# Patient Record
Sex: Female | Born: 1960 | Race: White | Hispanic: No | Smoking: Former smoker
Health system: Southern US, Community
[De-identification: ages and names within clinical notes are randomized; demographics above are authoritative.]

## PROBLEM LIST (undated history)

## (undated) DIAGNOSIS — J329 Chronic sinusitis, unspecified: Secondary | ICD-10-CM

## (undated) DIAGNOSIS — J449 Chronic obstructive pulmonary disease, unspecified: Secondary | ICD-10-CM

## (undated) DIAGNOSIS — G589 Mononeuropathy, unspecified: Secondary | ICD-10-CM

## (undated) DIAGNOSIS — M2041 Other hammer toe(s) (acquired), right foot: Secondary | ICD-10-CM

## (undated) DIAGNOSIS — Z950 Presence of cardiac pacemaker: Secondary | ICD-10-CM

## (undated) DIAGNOSIS — I4891 Unspecified atrial fibrillation: Secondary | ICD-10-CM

## (undated) DIAGNOSIS — E039 Hypothyroidism, unspecified: Secondary | ICD-10-CM

## (undated) DIAGNOSIS — J479 Bronchiectasis, uncomplicated: Secondary | ICD-10-CM

## (undated) DIAGNOSIS — I509 Heart failure, unspecified: Secondary | ICD-10-CM

## (undated) DIAGNOSIS — Z8774 Personal history of (corrected) congenital malformations of heart and circulatory system: Secondary | ICD-10-CM

## (undated) DIAGNOSIS — R413 Other amnesia: Secondary | ICD-10-CM

## (undated) DIAGNOSIS — M1990 Unspecified osteoarthritis, unspecified site: Secondary | ICD-10-CM

## (undated) DIAGNOSIS — F4310 Post-traumatic stress disorder, unspecified: Secondary | ICD-10-CM

## (undated) DIAGNOSIS — L089 Local infection of the skin and subcutaneous tissue, unspecified: Secondary | ICD-10-CM

## (undated) DIAGNOSIS — G4733 Obstructive sleep apnea (adult) (pediatric): Secondary | ICD-10-CM

## (undated) DIAGNOSIS — G629 Polyneuropathy, unspecified: Secondary | ICD-10-CM

## (undated) DIAGNOSIS — T148XXA Other injury of unspecified body region, initial encounter: Secondary | ICD-10-CM

## (undated) DIAGNOSIS — J45909 Unspecified asthma, uncomplicated: Secondary | ICD-10-CM

## (undated) DIAGNOSIS — D649 Anemia, unspecified: Secondary | ICD-10-CM

## (undated) DIAGNOSIS — G43909 Migraine, unspecified, not intractable, without status migrainosus: Secondary | ICD-10-CM

## (undated) DIAGNOSIS — T8859XA Other complications of anesthesia, initial encounter: Secondary | ICD-10-CM

## (undated) DIAGNOSIS — L509 Urticaria, unspecified: Secondary | ICD-10-CM

## (undated) DIAGNOSIS — E785 Hyperlipidemia, unspecified: Secondary | ICD-10-CM

## (undated) DIAGNOSIS — I442 Atrioventricular block, complete: Secondary | ICD-10-CM

## (undated) DIAGNOSIS — D808 Other immunodeficiencies with predominantly antibody defects: Secondary | ICD-10-CM

## (undated) DIAGNOSIS — G459 Transient cerebral ischemic attack, unspecified: Secondary | ICD-10-CM

## (undated) DIAGNOSIS — R55 Syncope and collapse: Secondary | ICD-10-CM

## (undated) DIAGNOSIS — M48061 Spinal stenosis, lumbar region without neurogenic claudication: Secondary | ICD-10-CM

## (undated) DIAGNOSIS — Z8616 Personal history of COVID-19: Secondary | ICD-10-CM

## (undated) DIAGNOSIS — R251 Tremor, unspecified: Secondary | ICD-10-CM

## (undated) DIAGNOSIS — R918 Other nonspecific abnormal finding of lung field: Secondary | ICD-10-CM

## (undated) DIAGNOSIS — R519 Headache, unspecified: Secondary | ICD-10-CM

## (undated) DIAGNOSIS — G5601 Carpal tunnel syndrome, right upper limb: Secondary | ICD-10-CM

## (undated) DIAGNOSIS — K5900 Constipation, unspecified: Secondary | ICD-10-CM

## (undated) DIAGNOSIS — R001 Bradycardia, unspecified: Secondary | ICD-10-CM

## (undated) DIAGNOSIS — K219 Gastro-esophageal reflux disease without esophagitis: Secondary | ICD-10-CM

## (undated) DIAGNOSIS — M2011 Hallux valgus (acquired), right foot: Secondary | ICD-10-CM

## (undated) DIAGNOSIS — M5134 Other intervertebral disc degeneration, thoracic region: Secondary | ICD-10-CM

## (undated) DIAGNOSIS — Z7901 Long term (current) use of anticoagulants: Secondary | ICD-10-CM

## (undated) DIAGNOSIS — I700 Atherosclerosis of aorta: Secondary | ICD-10-CM

## (undated) DIAGNOSIS — G479 Sleep disorder, unspecified: Secondary | ICD-10-CM

## (undated) DIAGNOSIS — I10 Essential (primary) hypertension: Secondary | ICD-10-CM

## (undated) DIAGNOSIS — T4145XA Adverse effect of unspecified anesthetic, initial encounter: Secondary | ICD-10-CM

## (undated) DIAGNOSIS — E119 Type 2 diabetes mellitus without complications: Secondary | ICD-10-CM

## (undated) DIAGNOSIS — E669 Obesity, unspecified: Secondary | ICD-10-CM

## (undated) DIAGNOSIS — F319 Bipolar disorder, unspecified: Secondary | ICD-10-CM

## (undated) DIAGNOSIS — D801 Nonfamilial hypogammaglobulinemia: Secondary | ICD-10-CM

## (undated) DIAGNOSIS — D802 Selective deficiency of immunoglobulin A [IgA]: Secondary | ICD-10-CM

## (undated) DIAGNOSIS — G4730 Sleep apnea, unspecified: Secondary | ICD-10-CM

## (undated) HISTORY — DX: Chronic sinusitis, unspecified: J32.9

## (undated) HISTORY — DX: Type 2 diabetes mellitus without complications: E11.9

## (undated) HISTORY — DX: Unspecified atrial fibrillation: I48.91

## (undated) HISTORY — DX: Nonfamilial hypogammaglobulinemia: D80.1

## (undated) HISTORY — DX: Other immunodeficiencies with predominantly antibody defects: D80.8

## (undated) HISTORY — DX: Unspecified asthma, uncomplicated: J45.909

## (undated) HISTORY — DX: Other injury of unspecified body region, initial encounter: T14.8XXA

## (undated) HISTORY — DX: Bipolar disorder, unspecified: F31.9

## (undated) HISTORY — DX: Unspecified osteoarthritis, unspecified site: M19.90

## (undated) HISTORY — DX: Mononeuropathy, unspecified: G58.9

## (undated) HISTORY — PX: CARDIAC SURGERY: SHX584

## (undated) HISTORY — DX: Bronchiectasis, uncomplicated: J47.9

## (undated) HISTORY — PX: CARPAL TUNNEL RELEASE: SHX101

## (undated) HISTORY — DX: Heart failure, unspecified: I50.9

## (undated) HISTORY — DX: Urticaria, unspecified: L50.9

## (undated) HISTORY — DX: Hypothyroidism, unspecified: E03.9

## (undated) HISTORY — DX: Local infection of the skin and subcutaneous tissue, unspecified: L08.9

## (undated) HISTORY — DX: Selective deficiency of immunoglobulin a (iga): D80.2

## (undated) HISTORY — DX: Carpal tunnel syndrome, right upper limb: G56.01

---
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
## 2007-10-11 HISTORY — PX: CARDIOVERSION: SHX1299

## 2015-10-11 HISTORY — PX: NASAL SINUS SURGERY: SHX719

## 2015-10-15 DIAGNOSIS — R0602 Shortness of breath: Secondary | ICD-10-CM | POA: Diagnosis not present

## 2015-11-10 DIAGNOSIS — Z1211 Encounter for screening for malignant neoplasm of colon: Secondary | ICD-10-CM | POA: Diagnosis not present

## 2015-11-10 DIAGNOSIS — K5730 Diverticulosis of large intestine without perforation or abscess without bleeding: Secondary | ICD-10-CM | POA: Diagnosis not present

## 2015-11-10 DIAGNOSIS — Z800 Family history of malignant neoplasm of digestive organs: Secondary | ICD-10-CM | POA: Diagnosis not present

## 2015-11-10 DIAGNOSIS — K621 Rectal polyp: Secondary | ICD-10-CM | POA: Diagnosis not present

## 2015-11-11 DIAGNOSIS — J470 Bronchiectasis with acute lower respiratory infection: Secondary | ICD-10-CM | POA: Diagnosis not present

## 2015-11-11 DIAGNOSIS — J320 Chronic maxillary sinusitis: Secondary | ICD-10-CM | POA: Diagnosis not present

## 2015-11-11 DIAGNOSIS — R0609 Other forms of dyspnea: Secondary | ICD-10-CM | POA: Diagnosis not present

## 2015-11-11 DIAGNOSIS — G4733 Obstructive sleep apnea (adult) (pediatric): Secondary | ICD-10-CM | POA: Diagnosis not present

## 2015-11-11 DIAGNOSIS — J4551 Severe persistent asthma with (acute) exacerbation: Secondary | ICD-10-CM | POA: Diagnosis not present

## 2015-11-11 DIAGNOSIS — I480 Paroxysmal atrial fibrillation: Secondary | ICD-10-CM | POA: Diagnosis not present

## 2015-11-11 DIAGNOSIS — I517 Cardiomegaly: Secondary | ICD-10-CM | POA: Diagnosis not present

## 2015-11-11 DIAGNOSIS — I10 Essential (primary) hypertension: Secondary | ICD-10-CM | POA: Diagnosis not present

## 2015-11-11 DIAGNOSIS — I519 Heart disease, unspecified: Secondary | ICD-10-CM | POA: Diagnosis not present

## 2015-12-03 DIAGNOSIS — R0602 Shortness of breath: Secondary | ICD-10-CM | POA: Diagnosis not present

## 2015-12-28 DIAGNOSIS — G43109 Migraine with aura, not intractable, without status migrainosus: Secondary | ICD-10-CM | POA: Diagnosis not present

## 2015-12-28 DIAGNOSIS — R45851 Suicidal ideations: Secondary | ICD-10-CM | POA: Diagnosis not present

## 2015-12-28 DIAGNOSIS — F450 Somatization disorder: Secondary | ICD-10-CM | POA: Diagnosis not present

## 2015-12-28 DIAGNOSIS — R079 Chest pain, unspecified: Secondary | ICD-10-CM | POA: Diagnosis not present

## 2015-12-28 DIAGNOSIS — G8194 Hemiplegia, unspecified affecting left nondominant side: Secondary | ICD-10-CM | POA: Diagnosis not present

## 2015-12-28 DIAGNOSIS — Z6841 Body Mass Index (BMI) 40.0 and over, adult: Secondary | ICD-10-CM | POA: Diagnosis not present

## 2015-12-28 DIAGNOSIS — I110 Hypertensive heart disease with heart failure: Secondary | ICD-10-CM | POA: Diagnosis not present

## 2015-12-28 DIAGNOSIS — I679 Cerebrovascular disease, unspecified: Secondary | ICD-10-CM | POA: Diagnosis not present

## 2015-12-28 DIAGNOSIS — I5032 Chronic diastolic (congestive) heart failure: Secondary | ICD-10-CM | POA: Diagnosis not present

## 2015-12-29 DIAGNOSIS — F603 Borderline personality disorder: Secondary | ICD-10-CM | POA: Diagnosis present

## 2015-12-29 DIAGNOSIS — Z8659 Personal history of other mental and behavioral disorders: Secondary | ICD-10-CM | POA: Diagnosis not present

## 2015-12-29 DIAGNOSIS — F319 Bipolar disorder, unspecified: Secondary | ICD-10-CM | POA: Diagnosis present

## 2015-12-29 DIAGNOSIS — Z7901 Long term (current) use of anticoagulants: Secondary | ICD-10-CM | POA: Diagnosis not present

## 2015-12-29 DIAGNOSIS — F450 Somatization disorder: Secondary | ICD-10-CM | POA: Diagnosis present

## 2015-12-29 DIAGNOSIS — I480 Paroxysmal atrial fibrillation: Secondary | ICD-10-CM | POA: Diagnosis present

## 2015-12-29 DIAGNOSIS — J449 Chronic obstructive pulmonary disease, unspecified: Secondary | ICD-10-CM | POA: Diagnosis present

## 2015-12-29 DIAGNOSIS — F39 Unspecified mood [affective] disorder: Secondary | ICD-10-CM | POA: Diagnosis present

## 2015-12-29 DIAGNOSIS — E119 Type 2 diabetes mellitus without complications: Secondary | ICD-10-CM | POA: Diagnosis present

## 2015-12-29 DIAGNOSIS — J4550 Severe persistent asthma, uncomplicated: Secondary | ICD-10-CM | POA: Diagnosis present

## 2015-12-29 DIAGNOSIS — R45851 Suicidal ideations: Secondary | ICD-10-CM | POA: Diagnosis not present

## 2015-12-29 DIAGNOSIS — Z794 Long term (current) use of insulin: Secondary | ICD-10-CM | POA: Diagnosis not present

## 2015-12-29 DIAGNOSIS — F411 Generalized anxiety disorder: Secondary | ICD-10-CM | POA: Diagnosis present

## 2015-12-29 DIAGNOSIS — I5032 Chronic diastolic (congestive) heart failure: Secondary | ICD-10-CM | POA: Diagnosis present

## 2015-12-29 DIAGNOSIS — G4733 Obstructive sleep apnea (adult) (pediatric): Secondary | ICD-10-CM | POA: Diagnosis present

## 2015-12-29 DIAGNOSIS — Z6841 Body Mass Index (BMI) 40.0 and over, adult: Secondary | ICD-10-CM | POA: Diagnosis not present

## 2015-12-29 DIAGNOSIS — M6281 Muscle weakness (generalized): Secondary | ICD-10-CM | POA: Diagnosis present

## 2015-12-29 DIAGNOSIS — Z62820 Parent-biological child conflict: Secondary | ICD-10-CM | POA: Diagnosis present

## 2015-12-29 DIAGNOSIS — G8194 Hemiplegia, unspecified affecting left nondominant side: Secondary | ICD-10-CM | POA: Diagnosis not present

## 2015-12-29 DIAGNOSIS — G43109 Migraine with aura, not intractable, without status migrainosus: Secondary | ICD-10-CM | POA: Diagnosis present

## 2015-12-29 DIAGNOSIS — H53149 Visual discomfort, unspecified: Secondary | ICD-10-CM | POA: Diagnosis present

## 2015-12-29 DIAGNOSIS — R200 Anesthesia of skin: Secondary | ICD-10-CM | POA: Diagnosis present

## 2015-12-29 DIAGNOSIS — E039 Hypothyroidism, unspecified: Secondary | ICD-10-CM | POA: Diagnosis present

## 2015-12-29 DIAGNOSIS — R0789 Other chest pain: Secondary | ICD-10-CM | POA: Diagnosis present

## 2015-12-29 DIAGNOSIS — I110 Hypertensive heart disease with heart failure: Secondary | ICD-10-CM | POA: Diagnosis present

## 2016-01-12 DIAGNOSIS — I519 Heart disease, unspecified: Secondary | ICD-10-CM | POA: Diagnosis not present

## 2016-01-12 DIAGNOSIS — I517 Cardiomegaly: Secondary | ICD-10-CM | POA: Diagnosis not present

## 2016-01-12 DIAGNOSIS — I480 Paroxysmal atrial fibrillation: Secondary | ICD-10-CM | POA: Diagnosis not present

## 2016-01-12 DIAGNOSIS — G4733 Obstructive sleep apnea (adult) (pediatric): Secondary | ICD-10-CM | POA: Diagnosis not present

## 2016-01-12 DIAGNOSIS — I10 Essential (primary) hypertension: Secondary | ICD-10-CM | POA: Diagnosis not present

## 2016-02-12 DIAGNOSIS — Z794 Long term (current) use of insulin: Secondary | ICD-10-CM | POA: Diagnosis not present

## 2016-02-12 DIAGNOSIS — G4733 Obstructive sleep apnea (adult) (pediatric): Secondary | ICD-10-CM | POA: Diagnosis present

## 2016-02-12 DIAGNOSIS — Z6841 Body Mass Index (BMI) 40.0 and over, adult: Secondary | ICD-10-CM | POA: Diagnosis not present

## 2016-02-12 DIAGNOSIS — J3489 Other specified disorders of nose and nasal sinuses: Secondary | ICD-10-CM | POA: Diagnosis present

## 2016-02-12 DIAGNOSIS — Z86711 Personal history of pulmonary embolism: Secondary | ICD-10-CM | POA: Diagnosis not present

## 2016-02-12 DIAGNOSIS — J343 Hypertrophy of nasal turbinates: Secondary | ICD-10-CM | POA: Diagnosis present

## 2016-02-12 DIAGNOSIS — J449 Chronic obstructive pulmonary disease, unspecified: Secondary | ICD-10-CM | POA: Diagnosis present

## 2016-02-12 DIAGNOSIS — E079 Disorder of thyroid, unspecified: Secondary | ICD-10-CM | POA: Diagnosis present

## 2016-02-12 DIAGNOSIS — R400 Somnolence: Secondary | ICD-10-CM | POA: Diagnosis not present

## 2016-02-12 DIAGNOSIS — Z7901 Long term (current) use of anticoagulants: Secondary | ICD-10-CM | POA: Diagnosis not present

## 2016-02-12 DIAGNOSIS — J320 Chronic maxillary sinusitis: Secondary | ICD-10-CM | POA: Diagnosis present

## 2016-02-12 DIAGNOSIS — J4550 Severe persistent asthma, uncomplicated: Secondary | ICD-10-CM | POA: Diagnosis present

## 2016-02-12 DIAGNOSIS — I480 Paroxysmal atrial fibrillation: Secondary | ICD-10-CM | POA: Diagnosis present

## 2016-02-12 DIAGNOSIS — I10 Essential (primary) hypertension: Secondary | ICD-10-CM | POA: Diagnosis present

## 2016-02-12 DIAGNOSIS — F319 Bipolar disorder, unspecified: Secondary | ICD-10-CM | POA: Diagnosis present

## 2016-02-12 DIAGNOSIS — Z87891 Personal history of nicotine dependence: Secondary | ICD-10-CM | POA: Diagnosis not present

## 2016-02-12 DIAGNOSIS — E1165 Type 2 diabetes mellitus with hyperglycemia: Secondary | ICD-10-CM | POA: Diagnosis not present

## 2016-02-12 DIAGNOSIS — F3181 Bipolar II disorder: Secondary | ICD-10-CM | POA: Diagnosis present

## 2016-02-12 DIAGNOSIS — R0981 Nasal congestion: Secondary | ICD-10-CM | POA: Diagnosis present

## 2016-03-03 DIAGNOSIS — I519 Heart disease, unspecified: Secondary | ICD-10-CM | POA: Diagnosis not present

## 2016-03-03 DIAGNOSIS — I480 Paroxysmal atrial fibrillation: Secondary | ICD-10-CM | POA: Diagnosis not present

## 2016-03-03 DIAGNOSIS — J479 Bronchiectasis, uncomplicated: Secondary | ICD-10-CM | POA: Diagnosis not present

## 2016-03-03 DIAGNOSIS — Z141 Cystic fibrosis carrier: Secondary | ICD-10-CM | POA: Diagnosis not present

## 2016-03-03 DIAGNOSIS — J320 Chronic maxillary sinusitis: Secondary | ICD-10-CM | POA: Diagnosis not present

## 2016-03-03 DIAGNOSIS — J4550 Severe persistent asthma, uncomplicated: Secondary | ICD-10-CM | POA: Diagnosis not present

## 2016-03-24 DIAGNOSIS — Z87891 Personal history of nicotine dependence: Secondary | ICD-10-CM | POA: Diagnosis not present

## 2016-03-24 DIAGNOSIS — I4891 Unspecified atrial fibrillation: Secondary | ICD-10-CM | POA: Diagnosis not present

## 2016-03-24 DIAGNOSIS — R2241 Localized swelling, mass and lump, right lower limb: Secondary | ICD-10-CM | POA: Diagnosis not present

## 2016-03-24 DIAGNOSIS — Z794 Long term (current) use of insulin: Secondary | ICD-10-CM | POA: Diagnosis not present

## 2016-03-24 DIAGNOSIS — E119 Type 2 diabetes mellitus without complications: Secondary | ICD-10-CM | POA: Diagnosis not present

## 2016-03-24 DIAGNOSIS — Z7984 Long term (current) use of oral hypoglycemic drugs: Secondary | ICD-10-CM | POA: Diagnosis not present

## 2016-03-24 DIAGNOSIS — Z7901 Long term (current) use of anticoagulants: Secondary | ICD-10-CM | POA: Diagnosis not present

## 2016-03-24 DIAGNOSIS — M79604 Pain in right leg: Secondary | ICD-10-CM | POA: Diagnosis not present

## 2016-04-05 DIAGNOSIS — I4891 Unspecified atrial fibrillation: Secondary | ICD-10-CM | POA: Diagnosis not present

## 2016-04-05 DIAGNOSIS — E119 Type 2 diabetes mellitus without complications: Secondary | ICD-10-CM | POA: Diagnosis not present

## 2016-04-05 DIAGNOSIS — I10 Essential (primary) hypertension: Secondary | ICD-10-CM | POA: Diagnosis not present

## 2016-04-05 DIAGNOSIS — M545 Low back pain: Secondary | ICD-10-CM | POA: Diagnosis not present

## 2016-04-05 DIAGNOSIS — E669 Obesity, unspecified: Secondary | ICD-10-CM | POA: Diagnosis not present

## 2016-05-24 DIAGNOSIS — E119 Type 2 diabetes mellitus without complications: Secondary | ICD-10-CM | POA: Diagnosis not present

## 2016-05-24 DIAGNOSIS — D801 Nonfamilial hypogammaglobulinemia: Secondary | ICD-10-CM | POA: Diagnosis not present

## 2016-05-24 DIAGNOSIS — M1990 Unspecified osteoarthritis, unspecified site: Secondary | ICD-10-CM | POA: Diagnosis not present

## 2016-05-24 DIAGNOSIS — J479 Bronchiectasis, uncomplicated: Secondary | ICD-10-CM | POA: Diagnosis not present

## 2016-05-24 DIAGNOSIS — F319 Bipolar disorder, unspecified: Secondary | ICD-10-CM | POA: Diagnosis not present

## 2016-05-24 DIAGNOSIS — E039 Hypothyroidism, unspecified: Secondary | ICD-10-CM | POA: Diagnosis not present

## 2016-05-24 DIAGNOSIS — J45909 Unspecified asthma, uncomplicated: Secondary | ICD-10-CM | POA: Diagnosis not present

## 2016-06-08 DIAGNOSIS — D803 Selective deficiency of immunoglobulin G [IgG] subclasses: Secondary | ICD-10-CM | POA: Diagnosis not present

## 2016-08-17 DIAGNOSIS — M542 Cervicalgia: Secondary | ICD-10-CM | POA: Diagnosis not present

## 2016-08-17 DIAGNOSIS — S79919A Unspecified injury of unspecified hip, initial encounter: Secondary | ICD-10-CM | POA: Diagnosis not present

## 2016-08-17 DIAGNOSIS — Z87891 Personal history of nicotine dependence: Secondary | ICD-10-CM | POA: Diagnosis not present

## 2016-08-17 DIAGNOSIS — S39012A Strain of muscle, fascia and tendon of lower back, initial encounter: Secondary | ICD-10-CM | POA: Diagnosis not present

## 2016-08-17 DIAGNOSIS — J449 Chronic obstructive pulmonary disease, unspecified: Secondary | ICD-10-CM | POA: Diagnosis not present

## 2016-08-17 DIAGNOSIS — M546 Pain in thoracic spine: Secondary | ICD-10-CM | POA: Diagnosis not present

## 2016-08-17 DIAGNOSIS — R112 Nausea with vomiting, unspecified: Secondary | ICD-10-CM | POA: Diagnosis not present

## 2016-08-17 DIAGNOSIS — M25552 Pain in left hip: Secondary | ICD-10-CM | POA: Diagnosis not present

## 2016-08-17 DIAGNOSIS — S0990XA Unspecified injury of head, initial encounter: Secondary | ICD-10-CM | POA: Diagnosis not present

## 2016-08-17 DIAGNOSIS — E119 Type 2 diabetes mellitus without complications: Secondary | ICD-10-CM | POA: Diagnosis not present

## 2016-08-17 DIAGNOSIS — S79929A Unspecified injury of unspecified thigh, initial encounter: Secondary | ICD-10-CM | POA: Diagnosis not present

## 2016-08-17 DIAGNOSIS — Z7901 Long term (current) use of anticoagulants: Secondary | ICD-10-CM | POA: Diagnosis not present

## 2016-08-17 DIAGNOSIS — W19XXXA Unspecified fall, initial encounter: Secondary | ICD-10-CM | POA: Diagnosis not present

## 2016-08-17 DIAGNOSIS — S199XXA Unspecified injury of neck, initial encounter: Secondary | ICD-10-CM | POA: Diagnosis not present

## 2016-08-17 DIAGNOSIS — M545 Low back pain: Secondary | ICD-10-CM | POA: Diagnosis not present

## 2016-08-17 DIAGNOSIS — I10 Essential (primary) hypertension: Secondary | ICD-10-CM | POA: Diagnosis not present

## 2016-08-17 DIAGNOSIS — R0602 Shortness of breath: Secondary | ICD-10-CM | POA: Diagnosis not present

## 2016-08-17 DIAGNOSIS — Z794 Long term (current) use of insulin: Secondary | ICD-10-CM | POA: Diagnosis not present

## 2016-08-17 DIAGNOSIS — I480 Paroxysmal atrial fibrillation: Secondary | ICD-10-CM | POA: Diagnosis not present

## 2016-11-28 DIAGNOSIS — Z23 Encounter for immunization: Secondary | ICD-10-CM | POA: Diagnosis not present

## 2017-02-23 DIAGNOSIS — Z792 Long term (current) use of antibiotics: Secondary | ICD-10-CM | POA: Diagnosis not present

## 2017-02-23 DIAGNOSIS — L03012 Cellulitis of left finger: Secondary | ICD-10-CM | POA: Diagnosis not present

## 2017-03-22 DIAGNOSIS — Z1231 Encounter for screening mammogram for malignant neoplasm of breast: Secondary | ICD-10-CM | POA: Diagnosis not present

## 2017-03-23 DIAGNOSIS — I10 Essential (primary) hypertension: Secondary | ICD-10-CM | POA: Diagnosis not present

## 2017-03-23 DIAGNOSIS — I480 Paroxysmal atrial fibrillation: Secondary | ICD-10-CM | POA: Diagnosis not present

## 2017-03-23 DIAGNOSIS — D803 Selective deficiency of immunoglobulin G [IgG] subclasses: Secondary | ICD-10-CM | POA: Diagnosis not present

## 2017-03-23 DIAGNOSIS — I5030 Unspecified diastolic (congestive) heart failure: Secondary | ICD-10-CM | POA: Diagnosis not present

## 2017-05-01 DIAGNOSIS — I10 Essential (primary) hypertension: Secondary | ICD-10-CM | POA: Diagnosis not present

## 2017-05-01 DIAGNOSIS — F319 Bipolar disorder, unspecified: Secondary | ICD-10-CM | POA: Diagnosis not present

## 2017-05-01 DIAGNOSIS — I509 Heart failure, unspecified: Secondary | ICD-10-CM | POA: Diagnosis not present

## 2017-05-01 DIAGNOSIS — R030 Elevated blood-pressure reading, without diagnosis of hypertension: Secondary | ICD-10-CM | POA: Diagnosis not present

## 2017-05-01 DIAGNOSIS — Z794 Long term (current) use of insulin: Secondary | ICD-10-CM | POA: Diagnosis not present

## 2017-05-01 DIAGNOSIS — I4891 Unspecified atrial fibrillation: Secondary | ICD-10-CM | POA: Diagnosis not present

## 2017-05-01 DIAGNOSIS — R55 Syncope and collapse: Secondary | ICD-10-CM | POA: Diagnosis not present

## 2017-05-01 DIAGNOSIS — F419 Anxiety disorder, unspecified: Secondary | ICD-10-CM | POA: Diagnosis not present

## 2017-05-01 DIAGNOSIS — D803 Selective deficiency of immunoglobulin G [IgG] subclasses: Secondary | ICD-10-CM | POA: Diagnosis not present

## 2017-05-01 DIAGNOSIS — Z7901 Long term (current) use of anticoagulants: Secondary | ICD-10-CM | POA: Diagnosis not present

## 2017-05-01 DIAGNOSIS — R079 Chest pain, unspecified: Secondary | ICD-10-CM | POA: Diagnosis not present

## 2017-05-01 DIAGNOSIS — J45909 Unspecified asthma, uncomplicated: Secondary | ICD-10-CM | POA: Diagnosis not present

## 2017-05-01 DIAGNOSIS — F4310 Post-traumatic stress disorder, unspecified: Secondary | ICD-10-CM | POA: Diagnosis not present

## 2017-05-01 DIAGNOSIS — J449 Chronic obstructive pulmonary disease, unspecified: Secondary | ICD-10-CM | POA: Diagnosis not present

## 2017-05-01 DIAGNOSIS — R000 Tachycardia, unspecified: Secondary | ICD-10-CM | POA: Diagnosis not present

## 2017-05-01 DIAGNOSIS — E119 Type 2 diabetes mellitus without complications: Secondary | ICD-10-CM | POA: Diagnosis not present

## 2017-06-01 DIAGNOSIS — R0609 Other forms of dyspnea: Secondary | ICD-10-CM | POA: Diagnosis not present

## 2017-06-01 DIAGNOSIS — R0600 Dyspnea, unspecified: Secondary | ICD-10-CM | POA: Diagnosis not present

## 2017-06-15 DIAGNOSIS — R0600 Dyspnea, unspecified: Secondary | ICD-10-CM | POA: Diagnosis not present

## 2017-06-15 DIAGNOSIS — R0602 Shortness of breath: Secondary | ICD-10-CM | POA: Diagnosis not present

## 2017-06-15 DIAGNOSIS — R0609 Other forms of dyspnea: Secondary | ICD-10-CM | POA: Diagnosis not present

## 2017-07-21 DIAGNOSIS — G6289 Other specified polyneuropathies: Secondary | ICD-10-CM | POA: Diagnosis not present

## 2017-07-21 DIAGNOSIS — I480 Paroxysmal atrial fibrillation: Secondary | ICD-10-CM | POA: Diagnosis not present

## 2017-07-21 DIAGNOSIS — E119 Type 2 diabetes mellitus without complications: Secondary | ICD-10-CM | POA: Diagnosis not present

## 2017-07-21 DIAGNOSIS — I10 Essential (primary) hypertension: Secondary | ICD-10-CM | POA: Diagnosis not present

## 2017-07-21 DIAGNOSIS — Z87891 Personal history of nicotine dependence: Secondary | ICD-10-CM | POA: Diagnosis not present

## 2017-07-21 DIAGNOSIS — R079 Chest pain, unspecified: Secondary | ICD-10-CM | POA: Diagnosis not present

## 2017-07-21 DIAGNOSIS — Z794 Long term (current) use of insulin: Secondary | ICD-10-CM | POA: Diagnosis not present

## 2017-07-21 DIAGNOSIS — R0600 Dyspnea, unspecified: Secondary | ICD-10-CM | POA: Diagnosis not present

## 2017-10-11 DIAGNOSIS — F330 Major depressive disorder, recurrent, mild: Secondary | ICD-10-CM | POA: Diagnosis not present

## 2017-10-18 DIAGNOSIS — F330 Major depressive disorder, recurrent, mild: Secondary | ICD-10-CM | POA: Diagnosis not present

## 2017-10-18 DIAGNOSIS — F411 Generalized anxiety disorder: Secondary | ICD-10-CM | POA: Diagnosis not present

## 2017-10-20 DIAGNOSIS — D801 Nonfamilial hypogammaglobulinemia: Secondary | ICD-10-CM | POA: Diagnosis not present

## 2017-10-24 DIAGNOSIS — Z87891 Personal history of nicotine dependence: Secondary | ICD-10-CM | POA: Diagnosis not present

## 2017-10-24 DIAGNOSIS — E119 Type 2 diabetes mellitus without complications: Secondary | ICD-10-CM | POA: Diagnosis not present

## 2017-10-24 DIAGNOSIS — Z7901 Long term (current) use of anticoagulants: Secondary | ICD-10-CM | POA: Diagnosis not present

## 2017-10-24 DIAGNOSIS — Z794 Long term (current) use of insulin: Secondary | ICD-10-CM | POA: Diagnosis not present

## 2017-10-24 DIAGNOSIS — F603 Borderline personality disorder: Secondary | ICD-10-CM | POA: Diagnosis not present

## 2017-10-24 DIAGNOSIS — J4520 Mild intermittent asthma, uncomplicated: Secondary | ICD-10-CM | POA: Diagnosis not present

## 2017-10-24 DIAGNOSIS — I480 Paroxysmal atrial fibrillation: Secondary | ICD-10-CM | POA: Diagnosis not present

## 2017-10-24 DIAGNOSIS — F3181 Bipolar II disorder: Secondary | ICD-10-CM | POA: Diagnosis not present

## 2017-10-24 DIAGNOSIS — F4323 Adjustment disorder with mixed anxiety and depressed mood: Secondary | ICD-10-CM | POA: Diagnosis not present

## 2017-10-25 DIAGNOSIS — F411 Generalized anxiety disorder: Secondary | ICD-10-CM | POA: Diagnosis not present

## 2017-10-25 DIAGNOSIS — F330 Major depressive disorder, recurrent, mild: Secondary | ICD-10-CM | POA: Diagnosis not present

## 2017-11-01 DIAGNOSIS — F411 Generalized anxiety disorder: Secondary | ICD-10-CM | POA: Diagnosis not present

## 2017-11-01 DIAGNOSIS — F330 Major depressive disorder, recurrent, mild: Secondary | ICD-10-CM | POA: Diagnosis not present

## 2017-11-03 DIAGNOSIS — D801 Nonfamilial hypogammaglobulinemia: Secondary | ICD-10-CM | POA: Diagnosis not present

## 2017-11-17 DIAGNOSIS — D801 Nonfamilial hypogammaglobulinemia: Secondary | ICD-10-CM | POA: Diagnosis not present

## 2017-11-17 DIAGNOSIS — F330 Major depressive disorder, recurrent, mild: Secondary | ICD-10-CM | POA: Diagnosis not present

## 2017-11-17 DIAGNOSIS — F410 Panic disorder [episodic paroxysmal anxiety] without agoraphobia: Secondary | ICD-10-CM | POA: Diagnosis not present

## 2017-11-22 DIAGNOSIS — F411 Generalized anxiety disorder: Secondary | ICD-10-CM | POA: Diagnosis not present

## 2017-11-22 DIAGNOSIS — R0602 Shortness of breath: Secondary | ICD-10-CM | POA: Diagnosis not present

## 2017-11-22 DIAGNOSIS — F330 Major depressive disorder, recurrent, mild: Secondary | ICD-10-CM | POA: Diagnosis not present

## 2017-11-22 DIAGNOSIS — J4551 Severe persistent asthma with (acute) exacerbation: Secondary | ICD-10-CM | POA: Diagnosis not present

## 2017-11-22 DIAGNOSIS — J4550 Severe persistent asthma, uncomplicated: Secondary | ICD-10-CM | POA: Diagnosis not present

## 2017-11-22 DIAGNOSIS — I480 Paroxysmal atrial fibrillation: Secondary | ICD-10-CM | POA: Diagnosis not present

## 2017-11-22 DIAGNOSIS — J329 Chronic sinusitis, unspecified: Secondary | ICD-10-CM | POA: Diagnosis not present

## 2017-11-22 DIAGNOSIS — R062 Wheezing: Secondary | ICD-10-CM | POA: Diagnosis not present

## 2017-11-22 DIAGNOSIS — G4733 Obstructive sleep apnea (adult) (pediatric): Secondary | ICD-10-CM | POA: Diagnosis not present

## 2017-11-22 DIAGNOSIS — J069 Acute upper respiratory infection, unspecified: Secondary | ICD-10-CM | POA: Diagnosis not present

## 2017-11-22 DIAGNOSIS — D801 Nonfamilial hypogammaglobulinemia: Secondary | ICD-10-CM | POA: Diagnosis not present

## 2017-11-22 DIAGNOSIS — J479 Bronchiectasis, uncomplicated: Secondary | ICD-10-CM | POA: Diagnosis not present

## 2017-12-01 DIAGNOSIS — D801 Nonfamilial hypogammaglobulinemia: Secondary | ICD-10-CM | POA: Diagnosis not present

## 2017-12-01 DIAGNOSIS — F411 Generalized anxiety disorder: Secondary | ICD-10-CM | POA: Diagnosis not present

## 2017-12-01 DIAGNOSIS — F330 Major depressive disorder, recurrent, mild: Secondary | ICD-10-CM | POA: Diagnosis not present

## 2017-12-15 DIAGNOSIS — F330 Major depressive disorder, recurrent, mild: Secondary | ICD-10-CM | POA: Diagnosis not present

## 2017-12-15 DIAGNOSIS — F411 Generalized anxiety disorder: Secondary | ICD-10-CM | POA: Diagnosis not present

## 2017-12-15 DIAGNOSIS — D801 Nonfamilial hypogammaglobulinemia: Secondary | ICD-10-CM | POA: Diagnosis not present

## 2017-12-20 DIAGNOSIS — F411 Generalized anxiety disorder: Secondary | ICD-10-CM | POA: Diagnosis not present

## 2017-12-20 DIAGNOSIS — F330 Major depressive disorder, recurrent, mild: Secondary | ICD-10-CM | POA: Diagnosis not present

## 2017-12-23 DIAGNOSIS — D803 Selective deficiency of immunoglobulin G [IgG] subclasses: Secondary | ICD-10-CM | POA: Diagnosis not present

## 2017-12-23 DIAGNOSIS — Z8673 Personal history of transient ischemic attack (TIA), and cerebral infarction without residual deficits: Secondary | ICD-10-CM | POA: Diagnosis not present

## 2017-12-23 DIAGNOSIS — J45901 Unspecified asthma with (acute) exacerbation: Secondary | ICD-10-CM | POA: Diagnosis not present

## 2017-12-23 DIAGNOSIS — R002 Palpitations: Secondary | ICD-10-CM | POA: Diagnosis not present

## 2017-12-23 DIAGNOSIS — R509 Fever, unspecified: Secondary | ICD-10-CM | POA: Diagnosis not present

## 2017-12-23 DIAGNOSIS — Z794 Long term (current) use of insulin: Secondary | ICD-10-CM | POA: Diagnosis not present

## 2017-12-23 DIAGNOSIS — J111 Influenza due to unidentified influenza virus with other respiratory manifestations: Secondary | ICD-10-CM | POA: Diagnosis not present

## 2017-12-23 DIAGNOSIS — E039 Hypothyroidism, unspecified: Secondary | ICD-10-CM | POA: Diagnosis not present

## 2017-12-23 DIAGNOSIS — E119 Type 2 diabetes mellitus without complications: Secondary | ICD-10-CM | POA: Diagnosis not present

## 2017-12-23 DIAGNOSIS — J449 Chronic obstructive pulmonary disease, unspecified: Secondary | ICD-10-CM | POA: Diagnosis not present

## 2017-12-23 DIAGNOSIS — R0602 Shortness of breath: Secondary | ICD-10-CM | POA: Diagnosis not present

## 2017-12-23 DIAGNOSIS — I480 Paroxysmal atrial fibrillation: Secondary | ICD-10-CM | POA: Diagnosis not present

## 2017-12-23 DIAGNOSIS — I10 Essential (primary) hypertension: Secondary | ICD-10-CM | POA: Diagnosis not present

## 2017-12-24 DIAGNOSIS — J101 Influenza due to other identified influenza virus with other respiratory manifestations: Secondary | ICD-10-CM | POA: Diagnosis not present

## 2017-12-24 DIAGNOSIS — D839 Common variable immunodeficiency, unspecified: Secondary | ICD-10-CM | POA: Diagnosis not present

## 2017-12-24 DIAGNOSIS — I10 Essential (primary) hypertension: Secondary | ICD-10-CM | POA: Diagnosis not present

## 2017-12-24 DIAGNOSIS — I480 Paroxysmal atrial fibrillation: Secondary | ICD-10-CM | POA: Diagnosis not present

## 2017-12-24 DIAGNOSIS — F3181 Bipolar II disorder: Secondary | ICD-10-CM | POA: Diagnosis not present

## 2017-12-24 DIAGNOSIS — J45901 Unspecified asthma with (acute) exacerbation: Secondary | ICD-10-CM | POA: Diagnosis not present

## 2017-12-24 DIAGNOSIS — E1140 Type 2 diabetes mellitus with diabetic neuropathy, unspecified: Secondary | ICD-10-CM | POA: Diagnosis not present

## 2017-12-24 DIAGNOSIS — D801 Nonfamilial hypogammaglobulinemia: Secondary | ICD-10-CM | POA: Diagnosis not present

## 2017-12-25 DIAGNOSIS — F3181 Bipolar II disorder: Secondary | ICD-10-CM | POA: Diagnosis not present

## 2017-12-25 DIAGNOSIS — E1140 Type 2 diabetes mellitus with diabetic neuropathy, unspecified: Secondary | ICD-10-CM | POA: Diagnosis not present

## 2017-12-25 DIAGNOSIS — J101 Influenza due to other identified influenza virus with other respiratory manifestations: Secondary | ICD-10-CM | POA: Diagnosis not present

## 2017-12-25 DIAGNOSIS — D801 Nonfamilial hypogammaglobulinemia: Secondary | ICD-10-CM | POA: Diagnosis not present

## 2017-12-25 DIAGNOSIS — J45901 Unspecified asthma with (acute) exacerbation: Secondary | ICD-10-CM | POA: Diagnosis not present

## 2017-12-25 DIAGNOSIS — D839 Common variable immunodeficiency, unspecified: Secondary | ICD-10-CM | POA: Diagnosis not present

## 2017-12-25 DIAGNOSIS — I10 Essential (primary) hypertension: Secondary | ICD-10-CM | POA: Diagnosis not present

## 2017-12-25 DIAGNOSIS — I480 Paroxysmal atrial fibrillation: Secondary | ICD-10-CM | POA: Diagnosis not present

## 2017-12-26 DIAGNOSIS — J101 Influenza due to other identified influenza virus with other respiratory manifestations: Secondary | ICD-10-CM | POA: Diagnosis not present

## 2017-12-27 DIAGNOSIS — J101 Influenza due to other identified influenza virus with other respiratory manifestations: Secondary | ICD-10-CM | POA: Diagnosis not present

## 2017-12-31 DIAGNOSIS — R0602 Shortness of breath: Secondary | ICD-10-CM | POA: Diagnosis not present

## 2017-12-31 DIAGNOSIS — Z8709 Personal history of other diseases of the respiratory system: Secondary | ICD-10-CM | POA: Diagnosis not present

## 2017-12-31 DIAGNOSIS — I10 Essential (primary) hypertension: Secondary | ICD-10-CM | POA: Diagnosis not present

## 2017-12-31 DIAGNOSIS — J45901 Unspecified asthma with (acute) exacerbation: Secondary | ICD-10-CM | POA: Diagnosis not present

## 2017-12-31 DIAGNOSIS — R05 Cough: Secondary | ICD-10-CM | POA: Diagnosis not present

## 2017-12-31 DIAGNOSIS — Z6841 Body Mass Index (BMI) 40.0 and over, adult: Secondary | ICD-10-CM | POA: Diagnosis not present

## 2017-12-31 DIAGNOSIS — Z87891 Personal history of nicotine dependence: Secondary | ICD-10-CM | POA: Diagnosis not present

## 2017-12-31 DIAGNOSIS — J449 Chronic obstructive pulmonary disease, unspecified: Secondary | ICD-10-CM | POA: Diagnosis not present

## 2017-12-31 DIAGNOSIS — J4551 Severe persistent asthma with (acute) exacerbation: Secondary | ICD-10-CM | POA: Diagnosis not present

## 2017-12-31 DIAGNOSIS — I482 Chronic atrial fibrillation: Secondary | ICD-10-CM | POA: Diagnosis not present

## 2017-12-31 DIAGNOSIS — I5032 Chronic diastolic (congestive) heart failure: Secondary | ICD-10-CM | POA: Diagnosis not present

## 2017-12-31 DIAGNOSIS — D801 Nonfamilial hypogammaglobulinemia: Secondary | ICD-10-CM | POA: Diagnosis not present

## 2017-12-31 DIAGNOSIS — E119 Type 2 diabetes mellitus without complications: Secondary | ICD-10-CM | POA: Diagnosis not present

## 2018-01-01 DIAGNOSIS — I110 Hypertensive heart disease with heart failure: Secondary | ICD-10-CM | POA: Diagnosis present

## 2018-01-01 DIAGNOSIS — F319 Bipolar disorder, unspecified: Secondary | ICD-10-CM | POA: Diagnosis present

## 2018-01-01 DIAGNOSIS — R0602 Shortness of breath: Secondary | ICD-10-CM | POA: Diagnosis not present

## 2018-01-01 DIAGNOSIS — F419 Anxiety disorder, unspecified: Secondary | ICD-10-CM | POA: Diagnosis present

## 2018-01-01 DIAGNOSIS — J471 Bronchiectasis with (acute) exacerbation: Secondary | ICD-10-CM | POA: Diagnosis not present

## 2018-01-01 DIAGNOSIS — Z794 Long term (current) use of insulin: Secondary | ICD-10-CM | POA: Diagnosis not present

## 2018-01-01 DIAGNOSIS — Z87891 Personal history of nicotine dependence: Secondary | ICD-10-CM | POA: Diagnosis not present

## 2018-01-01 DIAGNOSIS — J4551 Severe persistent asthma with (acute) exacerbation: Secondary | ICD-10-CM | POA: Diagnosis not present

## 2018-01-01 DIAGNOSIS — J449 Chronic obstructive pulmonary disease, unspecified: Secondary | ICD-10-CM | POA: Diagnosis present

## 2018-01-01 DIAGNOSIS — E1140 Type 2 diabetes mellitus with diabetic neuropathy, unspecified: Secondary | ICD-10-CM | POA: Diagnosis present

## 2018-01-01 DIAGNOSIS — D801 Nonfamilial hypogammaglobulinemia: Secondary | ICD-10-CM | POA: Diagnosis present

## 2018-01-01 DIAGNOSIS — E785 Hyperlipidemia, unspecified: Secondary | ICD-10-CM | POA: Diagnosis present

## 2018-01-01 DIAGNOSIS — Z6841 Body Mass Index (BMI) 40.0 and over, adult: Secondary | ICD-10-CM | POA: Diagnosis not present

## 2018-01-01 DIAGNOSIS — Z7901 Long term (current) use of anticoagulants: Secondary | ICD-10-CM | POA: Diagnosis not present

## 2018-01-01 DIAGNOSIS — E039 Hypothyroidism, unspecified: Secondary | ICD-10-CM | POA: Diagnosis present

## 2018-01-01 DIAGNOSIS — I480 Paroxysmal atrial fibrillation: Secondary | ICD-10-CM | POA: Diagnosis present

## 2018-01-01 DIAGNOSIS — I5032 Chronic diastolic (congestive) heart failure: Secondary | ICD-10-CM | POA: Diagnosis present

## 2018-01-01 DIAGNOSIS — G4733 Obstructive sleep apnea (adult) (pediatric): Secondary | ICD-10-CM | POA: Diagnosis present

## 2018-01-01 DIAGNOSIS — J45901 Unspecified asthma with (acute) exacerbation: Secondary | ICD-10-CM | POA: Diagnosis not present

## 2018-01-05 DIAGNOSIS — J4551 Severe persistent asthma with (acute) exacerbation: Secondary | ICD-10-CM | POA: Diagnosis not present

## 2018-01-09 DIAGNOSIS — J329 Chronic sinusitis, unspecified: Secondary | ICD-10-CM | POA: Diagnosis not present

## 2018-01-09 DIAGNOSIS — D801 Nonfamilial hypogammaglobulinemia: Secondary | ICD-10-CM | POA: Diagnosis not present

## 2018-01-09 DIAGNOSIS — I509 Heart failure, unspecified: Secondary | ICD-10-CM | POA: Diagnosis not present

## 2018-01-09 DIAGNOSIS — J479 Bronchiectasis, uncomplicated: Secondary | ICD-10-CM | POA: Diagnosis not present

## 2018-01-09 DIAGNOSIS — E119 Type 2 diabetes mellitus without complications: Secondary | ICD-10-CM | POA: Diagnosis not present

## 2018-01-09 DIAGNOSIS — J45909 Unspecified asthma, uncomplicated: Secondary | ICD-10-CM | POA: Diagnosis not present

## 2018-01-12 DIAGNOSIS — H04123 Dry eye syndrome of bilateral lacrimal glands: Secondary | ICD-10-CM | POA: Diagnosis not present

## 2018-01-12 DIAGNOSIS — H524 Presbyopia: Secondary | ICD-10-CM | POA: Diagnosis not present

## 2018-01-12 DIAGNOSIS — Z0100 Encounter for examination of eyes and vision without abnormal findings: Secondary | ICD-10-CM | POA: Diagnosis not present

## 2018-01-12 DIAGNOSIS — H52223 Regular astigmatism, bilateral: Secondary | ICD-10-CM | POA: Diagnosis not present

## 2018-01-12 DIAGNOSIS — Z794 Long term (current) use of insulin: Secondary | ICD-10-CM | POA: Diagnosis not present

## 2018-01-12 DIAGNOSIS — E119 Type 2 diabetes mellitus without complications: Secondary | ICD-10-CM | POA: Diagnosis not present

## 2018-01-12 DIAGNOSIS — H5203 Hypermetropia, bilateral: Secondary | ICD-10-CM | POA: Diagnosis not present

## 2018-01-17 DIAGNOSIS — F330 Major depressive disorder, recurrent, mild: Secondary | ICD-10-CM | POA: Diagnosis not present

## 2018-01-17 DIAGNOSIS — F411 Generalized anxiety disorder: Secondary | ICD-10-CM | POA: Diagnosis not present

## 2018-01-23 DIAGNOSIS — D801 Nonfamilial hypogammaglobulinemia: Secondary | ICD-10-CM | POA: Diagnosis not present

## 2018-02-05 DIAGNOSIS — M722 Plantar fascial fibromatosis: Secondary | ICD-10-CM | POA: Diagnosis not present

## 2018-02-06 ENCOUNTER — Ambulatory Visit: Payer: Self-pay | Admitting: Allergy and Immunology

## 2018-02-06 ENCOUNTER — Ambulatory Visit (INDEPENDENT_AMBULATORY_CARE_PROVIDER_SITE_OTHER): Payer: Medicare Other | Admitting: Allergy & Immunology

## 2018-02-06 ENCOUNTER — Encounter: Payer: Self-pay | Admitting: Allergy & Immunology

## 2018-02-06 VITALS — BP 132/76 | HR 76 | Temp 98.1°F | Resp 18 | Ht 63.78 in | Wt 254.4 lb

## 2018-02-06 DIAGNOSIS — D839 Common variable immunodeficiency, unspecified: Secondary | ICD-10-CM

## 2018-02-06 DIAGNOSIS — J479 Bronchiectasis, uncomplicated: Secondary | ICD-10-CM

## 2018-02-06 DIAGNOSIS — F319 Bipolar disorder, unspecified: Secondary | ICD-10-CM | POA: Diagnosis not present

## 2018-02-06 DIAGNOSIS — I480 Paroxysmal atrial fibrillation: Secondary | ICD-10-CM | POA: Diagnosis not present

## 2018-02-06 DIAGNOSIS — Z794 Long term (current) use of insulin: Secondary | ICD-10-CM

## 2018-02-06 DIAGNOSIS — J4550 Severe persistent asthma, uncomplicated: Secondary | ICD-10-CM | POA: Insufficient documentation

## 2018-02-06 DIAGNOSIS — J310 Chronic rhinitis: Secondary | ICD-10-CM

## 2018-02-06 DIAGNOSIS — E119 Type 2 diabetes mellitus without complications: Secondary | ICD-10-CM | POA: Diagnosis not present

## 2018-02-06 DIAGNOSIS — E1159 Type 2 diabetes mellitus with other circulatory complications: Secondary | ICD-10-CM | POA: Insufficient documentation

## 2018-02-06 DIAGNOSIS — I509 Heart failure, unspecified: Secondary | ICD-10-CM | POA: Diagnosis not present

## 2018-02-06 DIAGNOSIS — R918 Other nonspecific abnormal finding of lung field: Secondary | ICD-10-CM | POA: Diagnosis not present

## 2018-02-06 DIAGNOSIS — D801 Nonfamilial hypogammaglobulinemia: Secondary | ICD-10-CM | POA: Insufficient documentation

## 2018-02-06 DIAGNOSIS — IMO0001 Reserved for inherently not codable concepts without codable children: Secondary | ICD-10-CM
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
## 2018-02-06 NOTE — Patient Instructions (Addendum)
1. Hypogammaglobulinemia ([place]) - We are working on getting approval for the Cuvitru and I hope to know something by today if not the end of the week. - We will give you the infusions at home, but there is nursing care to provide assistance at your home to make sure that you feel comfortable with this.  2. Chronic rhinitis - We can certainly do testing in the future if needed, but at this time it seems that symptoms are controlled. - We can revisit this in the future if needed. - In the meantime, continue with your nasal saline rinses as well as nasal steroids as needed.  3. Severe persistent asthma, uncomplicated - Lung function did not look great today. - Samples of Breo provided today. - We will get lab work to see if you would qualify for an injectable medication (Xolair, Berna Bue, or [place]). - Daily controller medication(s): Breo 100/38mcg one puff once daily and Spiriva 1.79mcg two puffs once daily - Prior to physical activity: ProAir 2 puffs 10-15 minutes before physical activity. - Rescue medications: ProAir 4 puffs every 4-6 hours as needed, albuterol nebulizer one vial every 4-6 hours as needed or DuoNeb nebulizer one vial every 4-6 hours as needed - Asthma control goals:  * Full participation in all desired activities (may need albuterol before activity) * Albuterol use two time or less a week on average (not counting use with activity) * Cough interfering with sleep two time or less a month * Oral steroids no more than once a year * No hospitalizations  4. Pulmonary nodules with bronchiectasis - We will refer you to see a Pulmonologist in [place] to manage this.   5. Congestive heart failure/atrial fibrillation - We will refer you to see a Cardiologist in [place] to manage this.  6. Endocrinology - We will refer you to see [name] here in [place].  7. Return in about 1 month (around 03/06/2018).  Dr. Wolfgang Phoenix here in [place] is an excellent Primary Care  Provider. Give him a call to make a New Patient appointment.    Please inform us of any Emergency Department visits, hospitalizations, or changes in symptoms. Call us before going to the ED for breathing or allergy symptoms since we might be able to fit you in for a sick visit. Feel free to contact us anytime with any questions, problems, or concerns.  It was a pleasure to meet you today!  Here is my work cell if you need me: [phone]   Websites that have reliable patient information: 1. American Academy of Asthma, Allergy, and Immunology: www.aaaai.org 2. Food Allergy Research and Education (FARE): foodallergy.org 3. Mothers of Asthmatics: http://www.asthmacommunitynetwork.org 4. American College of Allergy, Asthma, and Immunology: www.acaai.org

## 2018-02-07 ENCOUNTER — Encounter: Payer: Self-pay | Admitting: Allergy & Immunology

## 2018-02-08 DIAGNOSIS — J4550 Severe persistent asthma, uncomplicated: Secondary | ICD-10-CM | POA: Diagnosis not present

## 2018-02-08 DIAGNOSIS — D801 Nonfamilial hypogammaglobulinemia: Secondary | ICD-10-CM | POA: Diagnosis not present

## 2018-02-09 ENCOUNTER — Telehealth: Payer: Self-pay | Admitting: *Deleted

## 2018-02-09 NOTE — Telephone Encounter (Signed)
I spoke to patient on 5/1 in regards to letting her know that [facility] will be in contact with her regarding getting her next dose of Cuvitru.  Laura [name] left voicemail for me today advising that everything set to go and nursing would be reaching out to patient to see if she will need help since she was learning to self administer in previous MD office.  He advised she soul be getting her dose and start next week.

## 2018-02-09 NOTE — Telephone Encounter (Signed)
Noted. Thanks so much for facilitating this!   Salvatore Marvel, MD [facility] [place]

## 2018-02-14 ENCOUNTER — Telehealth: Payer: Self-pay | Admitting: *Deleted

## 2018-02-14 NOTE — Telephone Encounter (Signed)
I called patient to check to make sure she have been contacted by [facility] to restart her Cuvitru here.  She advised she took her infusion at home yesterday with no problems.  I advised her if she had any issues to let me know.

## 2018-02-14 NOTE — Progress Notes (Signed)
We received notification from Dr. Wolfgang Phoenix that they are no longer accepting new patients. He recommended that we recommend Dr. Wende Neighbors instead. I will have our staff reach out to her to let her know.   Salvatore Marvel, MD [facility] [place]

## 2018-02-15 NOTE — Telephone Encounter (Signed)
Awesome. Thanks for letting me know!   Salvatore Marvel, MD [facility] [place]

## 2018-02-17 LAB — CBC WITH DIFFERENTIAL/PLATELET
BASOS ABS: 0 10*3/uL (ref 0.0–0.2)
Basos: 0 %
EOS (ABSOLUTE): 0 10*3/uL (ref 0.0–0.4)
EOS: 1 %
HEMATOCRIT: 40.2 % (ref 34.0–46.6)
HEMOGLOBIN: 13.5 g/dL (ref 11.1–15.9)
IMMATURE GRANULOCYTES: 0 %
Immature Grans (Abs): 0 10*3/uL (ref 0.0–0.1)
Lymphocytes Absolute: 1.6 10*3/uL (ref 0.7–3.1)
Lymphs: 45 %
MCH: 32.1 pg (ref 26.6–33.0)
MCHC: 33.6 g/dL (ref 31.5–35.7)
MCV: 96 fL (ref 79–97)
MONOCYTES: 7 %
MONOS ABS: 0.3 10*3/uL (ref 0.1–0.9)
Neutrophils Absolute: 1.6 10*3/uL (ref 1.4–7.0)
Neutrophils: 47 %
Platelets: 204 10*3/uL (ref 150–379)
RBC: 4.21 x10E6/uL (ref 3.77–5.28)
RDW: 15 % (ref 12.3–15.4)
WBC: 3.6 10*3/uL (ref 3.4–10.8)

## 2018-02-17 LAB — IGE+ALLERGENS ZONE 2(30)
Aspergillus Fumigatus IgE: <0.1 kU/L
Bahia Grass IgE: <0.1 kU/L
Cockroach, American IgE: <0.1 kU/L
Common Silver Birch IgE: <0.1 kU/L
D Pteronyssinus IgE: <0.1 kU/L
IgE (Immunoglobulin E), Serum: 3 IU/mL — ABNORMAL LOW (ref 6–495)
Johnson Grass IgE: <0.1 kU/L
Maple/Box Elder IgE: <0.1 kU/L
Mucor Racemosus IgE: <0.1 kU/L
Mugwort IgE Qn: <0.1 kU/L
Oak, White IgE: <0.1 kU/L
Plantain, English IgE: <0.1 kU/L
Sweet gum IgE RAST Ql: <0.1 kU/L
Timothy Grass IgE: <0.1 kU/L
White Mulberry IgE: <0.1 kU/L

## 2018-02-17 LAB — IGG: IGG (IMMUNOGLOBIN G), SERUM: 1059 mg/dL (ref 700–1600)

## 2018-02-20 DIAGNOSIS — M722 Plantar fascial fibromatosis: Secondary | ICD-10-CM | POA: Diagnosis not present

## 2018-02-27 DIAGNOSIS — M722 Plantar fascial fibromatosis: Secondary | ICD-10-CM | POA: Diagnosis not present

## 2018-03-01 ENCOUNTER — Encounter: Payer: Self-pay | Admitting: Pulmonary Disease

## 2018-03-01 ENCOUNTER — Ambulatory Visit (INDEPENDENT_AMBULATORY_CARE_PROVIDER_SITE_OTHER): Payer: Medicare Other | Admitting: Pulmonary Disease

## 2018-03-01 ENCOUNTER — Other Ambulatory Visit: Payer: Medicare Other

## 2018-03-01 VITALS — BP 182/90 | HR 77 | Ht 64.0 in | Wt 248.0 lb

## 2018-03-01 DIAGNOSIS — R05 Cough: Secondary | ICD-10-CM

## 2018-03-01 DIAGNOSIS — R058 Other specified cough: Secondary | ICD-10-CM

## 2018-03-01 MED ORDER — PREDNISONE 20 MG PO TABS: 20.0000 mg | ORAL_TABLET | Freq: Every day | ORAL | 0 refills | Status: DC

## 2018-03-01 MED ORDER — AMOXICILLIN-POT CLAVULANATE 875-125 MG PO TABS: 1.0000 | ORAL_TABLET | Freq: Two times a day (BID) | ORAL | 0 refills | Status: DC

## 2018-03-01 MED ORDER — MOMETASONE FURO-FORMOTEROL FUM 200-5 MCG/ACT IN AERO: 2.0000 | INHALATION_SPRAY | Freq: Two times a day (BID) | RESPIRATORY_TRACT | 0 refills | Status: DC

## 2018-03-01 MED ORDER — AEROCHAMBER MV MISC: 0 refills | Status: DC

## 2018-03-01 NOTE — Progress Notes (Signed)
   Subjective:    Patient ID: Laura [name], female    DOB: Oct 23, 1960, 57 y.o.   MRN: [id number]  HPI    Review of Systems  Constitutional: Negative for fever and unexpected weight change.  HENT: Negative for congestion, dental problem, ear pain, nosebleeds, postnasal drip, rhinorrhea, sinus pressure, sneezing, sore throat and trouble swallowing.   Eyes: Negative for redness and itching.  Respiratory: Positive for cough and shortness of breath. Negative for chest tightness and wheezing.   Cardiovascular: Positive for leg swelling. Negative for palpitations.  Gastrointestinal: Negative for nausea and vomiting.  Genitourinary: Negative for dysuria.  Musculoskeletal: Negative for joint swelling.  Skin: Negative for rash.  Neurological: Negative for headaches.  Hematological: Does not bruise/bleed easily.  Psychiatric/Behavioral: Negative for dysphoric mood. The patient is not nervous/anxious.        Objective:   Physical Exam        Assessment & Plan:

## 2018-03-01 NOTE — Patient Instructions (Signed)
Asthma with acute exacerbation: Stop Breo Start Dulera 200 2 puffs twice a day with a spacer Take prednisone 20mg  daily x 5 days  Bronchiectasis with acute exacerbation: You need to use the flutter valve twice a day no matter how you feel Provide [place] with a sample of mucus so we can test it for bacterial, fungal, mycobacterial organisms We will try to track down the records from the pulmonary doctor in [place]  Obstructive sleep apnea: Provide [place] with a copy of your last polysomnogram so we can submit this to a local medical equipment company for supplies  Common variable immunodeficiency: Continue immunoglobulin administration with Immunology  Follow up in 4-6 weeks

## 2018-03-01 NOTE — Progress Notes (Addendum)
Synopsis: Referred in 02/2018 for bronchiectasis.  She has a history of common variable immune deficiency as well as severe persistent asthma.  She also has paroxysmal atrial fibrillation and a history of congestive heart failure. She was diagnosed with common variable immunodeficiency based on a low serum IgG, borderline low IgA and abnormal responses to vaccination.  She started immunoglobulin replacement in 2017.  She has a history of recurrent sinopulmonary infections. She also has obstructive sleep apnea and asthma. Had PSG in 2007.  Has a history of sinus surgery in May 7616 but complicated by an allergic reaction to anesthesia. Also has a history of type 2 diabetes. She has moved around the [place] in the last few years.  She had ASD repair age 16 in [place] at [place].  Subjective:   PATIENT ID: Laura [name] GENDER: female DOB: 30-Oct-1960, MRN: [id number]   HPI  Chief Complaint  Patient presents with  . Pulmonary Consult    Pt referred by Dr. Ernst Bowler (immunologist) for asthma. Pt states she was diagnosed in 2007  - becoming more uncontrollable. Pt states she gets frequent flares that last more than one week.     Laura [name] is here to establish care with me.  She says that she is feeling more congested lately and is wheeing more lately > started 1 week ago, has been worsening since then > she went to [facility] where people were smoking > has been coughing up more phlegm lately > she doesn't routinely cough up phlegm > she hasn't used her nebulizer  Bronchiectasis: > says that she doesn't typically cough up mucus unless she is sick > sinus surgery and immunoglobulin infusions  > had a bad flare with influenza earlier this year > has had fewer exacerbations since she had immunoglobulin therapy and sinus surgery (2017).  For example in 2018 she had no exacerbations > two exacerbations in 2019, she feels like she is having a third right now > the influenza happened in [place] > she  uses an [place] device when she feels more congestion > thought to be related to her immunodeficiency > has had repeated episodes of pneumonia, nearly died from an episode once, she can't remember the year (sometime before 2014)  She changed from [place] to [place] in February, since then she has had more exacerbations.  She feels like the Breo isn't getting in her like the Breo should.  She used the [facility] with a spacer.  She continues to use Spiriva daily.    Obstructive sleep apnea > had polysomnogram in 2007 > uses CPAP but now can't get supplies because the copy of her PSG is not readable from a printing error.   Past Medical History:  Diagnosis Date  . Asthma   . Asthma   . Bipolar disorder ([place])   . Bronchiectasis ([place])   . CHF (congestive heart failure) ([place])   . Hypogammaglobulinemia ([place])   . Hypothyroid   . 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 Osteoarthritis   . Recurrent sinusitis   . Type 2 diabetes mellitus ([place])   . Wound infection      No family history on file.   Social History   Socioeconomic History  . Marital status: Married    Spouse name: Not on file  . Number of children: Not on file  . Years of education: Not on file  . Highest education level: Not on file  Occupational History  . Not on file  Social Needs  . Financial resource strain:  Not on file  . Food insecurity:    Worry: Not on file    Inability: Not on file  . Transportation needs:    Medical: Not on file    Non-medical: Not on file  Tobacco Use  . Smoking status: Former Smoker    Packs/day: 1.00    Years: 17.00    Pack years: 17.00    Types: Cigarettes    Last attempt to quit: 2008    Years since quitting: 11.3  . Smokeless tobacco: Never Used  Substance and Sexual Activity  . Alcohol use: Not on file  . Drug use: Not Currently    Types: Marijuana  . Sexual activity: Not on file  Lifestyle  . Physical activity:    Days per week: Not on file    Minutes per session: Not on file  .  Stress: Not on file  Relationships  . Social connections:    Talks on phone: Not on file    Gets together: Not on file    Attends religious service: Not on file    Active member of club or organization: Not on file    Attends meetings of clubs or organizations: Not on file    Relationship status: Not on file  . Intimate partner violence:    Fear of current or ex partner: Not on file    Emotionally abused: Not on file    Physically abused: Not on file    Forced sexual activity: Not on file  Other Topics Concern  . Not on file  Social History Narrative  . Not on file     Allergies  Allergen Reactions  . Ativan [Lorazepam] Hives  . Phenergan [Promethazine Hcl] Hives     Outpatient Medications Prior to Visit  Medication Sig Dispense Refill  . albuterol (PROVENTIL) (2.5 MG/3ML) 0.083% nebulizer solution Take 2.5 mg by nebulization every 6 (six) hours as needed for wheezing or shortness of breath.    . Albuterol Sulfate (PROAIR RESPICLICK) 627 (90 Base) MCG/ACT AEPB Inhale 1 puff into the lungs every 6 (six) hours as needed.    Marland Kitchen apixaban (ELIQUIS) 5 MG TABS tablet Take 5 mg by mouth 2 (two) times daily.    Marland Kitchen atorvastatin (LIPITOR) 10 MG tablet Take 10 mg by mouth daily.    . benzonatate (TESSALON) 100 MG capsule Take 100-200 mg by mouth 3 (three) times daily as needed for cough.    . budesonide (RHINOCORT AQUA) 32 MCG/ACT nasal spray Place 2 sprays into both nostrils daily.    . cyclobenzaprine (FLEXERIL) 10 MG tablet Take 10 mg by mouth daily as needed for muscle spasms.    Marland Kitchen diltiazem (CARDIZEM CD) 300 MG 24 hr capsule Take 300 mg by mouth daily.    . divalproex (DEPAKOTE ER) 500 MG 24 hr tablet Take 1,500 mg by mouth 2 (two) times daily.    . Dulaglutide (TRULICITY) 1.5 OJ/5.0KX SOPN Inject into the skin every Sunday.    . fluticasone furoate-vilanterol (BREO ELLIPTA) 100-25 MCG/INH AEPB Inhale 1 puff into the lungs daily.    . furosemide (LASIX) 20 MG tablet Take 20 mg by mouth.  Take 2 tablets every morning and 1 tablet at noon.    . gabapentin (NEURONTIN) 600 MG tablet Take 600 mg by mouth 3 (three) times daily.    . hydrALAZINE (APRESOLINE) 25 MG tablet Take 25 mg by mouth 3 (three) times daily.    . hydrochlorothiazide (HYDRODIURIL) 25 MG tablet Take 25 mg by mouth  daily.    . hydrOXYzine (ATARAX/VISTARIL) 25 MG tablet Take 25 mg by mouth 3 (three) times daily as needed.    . Immune Globulin, Human, (CUVITRU) 4 GM/20ML SOLN Inject 100 mLs into the skin every 14 (fourteen) days.    . insulin glargine (LANTUS) 100 UNIT/ML injection Inject 50 Units into the skin daily.    . insulin lispro (HUMALOG) 100 UNIT/ML injection Inject into the skin 3 (three) times daily before meals. 12 units in the morning and at noon, 16 units dinner time, plus 1 unit per 50>150 blood sugar    . lamoTRIgine (LAMICTAL) 100 MG tablet Take 50 mg by mouth daily.    Marland Kitchen levothyroxine (SYNTHROID, LEVOTHROID) 112 MCG tablet Take 112 mcg by mouth daily before breakfast.    . lisinopril (PRINIVIL,ZESTRIL) 40 MG tablet Take 40 mg by mouth daily.    . Melatonin 3 MG TABS Take 3 mg by mouth at bedtime.    . metFORMIN (GLUCOPHAGE) 500 MG tablet Take 1,000 mg by mouth 2 (two) times daily with a meal.    . tiotropium (SPIRIVA) 18 MCG inhalation capsule Place 18 mcg into inhaler and inhale daily.    . diazepam (VALIUM) 5 MG tablet Take 5-10 mg by mouth every 6 (six) hours as needed for anxiety.    . furosemide (LASIX) 10 MG/ML solution Take 10 mg by mouth every morning.    Marland Kitchen doxycycline (VIBRAMYCIN) 100 MG capsule Take 100 mg by mouth 2 (two) times daily.    . predniSONE (DELTASONE) 20 MG tablet Take 40 mg by mouth daily with breakfast.     No facility-administered medications prior to visit.     Review of Systems  Constitutional: Positive for malaise/fatigue. Negative for chills, fever and weight loss.  HENT: Negative for congestion, nosebleeds, sinus pain and sore throat.   Eyes: Negative for  photophobia, pain and discharge.  Respiratory: Positive for cough, sputum production, shortness of breath and wheezing. Negative for hemoptysis.   Cardiovascular: Negative for chest pain, palpitations, orthopnea and leg swelling.  Gastrointestinal: Negative for abdominal pain, constipation, diarrhea, nausea and vomiting.  Genitourinary: Negative for dysuria, frequency, hematuria and urgency.  Musculoskeletal: Negative for back pain, joint pain, myalgias and neck pain.  Skin: Negative for itching and rash.  Neurological: Negative for tingling, tremors, sensory change, speech change, focal weakness, seizures, weakness and headaches.  Psychiatric/Behavioral: Negative for memory loss, substance abuse and suicidal ideas. The patient is not nervous/anxious.       Objective:  Physical Exam   Vitals:   03/01/18 1138  BP: (!) 182/90  Pulse: 77  SpO2: 98%  Weight: 112.5 kg (248 lb)  Height: 5\' 4"  (1.626 m)    RA  Gen: well appearing, no acute distress HENT: NCAT, OP clear, neck supple without masses Eyes: PERRL, EOMi Lymph: no cervical lymphadenopathy PULM: Wheezing bilaterally, poor air movement CV: RRR, no mgr, no JVD GI: BS+, soft, nontender, no hsm Derm: no rash or skin breakdown MSK: normal bulk and tone Neuro: A&Ox4, CN II-XII intact, strength 5/5 in all 4 extremities Psyche: normal mood and affect   CBC    Component Value Date/Time   WBC 3.6 02/08/2018 0914   RBC 4.21 02/08/2018 0914   HGB 13.5 02/08/2018 0914   HCT 40.2 02/08/2018 0914   PLT 204 02/08/2018 0914   MCV 96 02/08/2018 0914   MCH 32.1 02/08/2018 0914   MCHC 33.6 02/08/2018 0914   RDW 15.0 02/08/2018 0914   LYMPHSABS 1.6 02/08/2018 0914  EOSABS 0.0 02/08/2018 0914   BASOSABS 0.0 02/08/2018 0914     Chest imaging: By report from her immunologist in [place]: CT chest in 2016 showed several tiny lung nodules as well as bronchiectasis.  PFT: 11/2016 [place]: Ratio 73, FEV1 1.52L (57%  pred) April 2019 spirometry from asthma and allergy: Ratio 59%, FEV1 1.59 L 67% predicted, 15% change with bronchodilator  Labs:  Path:  Echo:  Heart Catheterization:  Records from her office visit with asthma and allergy in April 2019 reviewed where she was continued on immunoglobulin supplementation.  Immunizations: Flu 07/2017 Pneumovax 05/2015    Assessment & Plan:   Productive cough - Plan: Respiratory or Resp and Sputum Culture, AFB Culture & Smear, Fungus Culture & Smear  Discussion: This is a pleasant 57 year old female with a past medical history significant for common variable immunodeficiency associated with bronchiectasis and recurrent exacerbations who comes to my clinic today to establish care for the same.  She also has a history of at least moderate persistent asthma with recurrent exacerbations.  It sounds as if after she had sinus surgery and started on immunoglobulin treatment in 2017 that her symptoms were well controlled.  In fact in 2018 she had no exacerbations.  However, in February 2019 she was changed from [facility] to [place].  Since then she has had 3 exacerbations and is currently suffering from a third now.  It is not entirely clear to me that she participates in mucociliary clearance measures routinely.  We stressed the importance of that today.  I would also like to understand her lung microbiology better.  Plan: Asthma with acute exacerbation: Stop Breo Start Dulera 200 2 puffs twice a day with a spacer Take prednisone 20mg  daily x 5 days  Bronchiectasis with acute exacerbation: You need to use the flutter valve twice a day no matter how you feel Provide [place] with a sample of mucus so we can test it for bacterial, fungal, mycobacterial organisms We will try to track down the records from the pulmonary doctor in [place]  Obstructive sleep apnea: Provide [place] with a copy of your last polysomnogram so we can submit this to a local medical equipment company for  supplies  Common variable immunodeficiency: Continue immunoglobulin administration with Immunology  Follow up in 4-6 weeks    Current Outpatient Medications:  .  albuterol (PROVENTIL) (2.5 MG/3ML) 0.083% nebulizer solution, Take 2.5 mg by nebulization every 6 (six) hours as needed for wheezing or shortness of breath., Disp: , Rfl:  .  Albuterol Sulfate (PROAIR RESPICLICK) 168 (90 Base) MCG/ACT AEPB, Inhale 1 puff into the lungs every 6 (six) hours as needed., Disp: , Rfl:  .  apixaban (ELIQUIS) 5 MG TABS tablet, Take 5 mg by mouth 2 (two) times daily., Disp: , Rfl:  .  atorvastatin (LIPITOR) 10 MG tablet, Take 10 mg by mouth daily., Disp: , Rfl:  .  benzonatate (TESSALON) 100 MG capsule, Take 100-200 mg by mouth 3 (three) times daily as needed for cough., Disp: , Rfl:  .  budesonide (RHINOCORT AQUA) 32 MCG/ACT nasal spray, Place 2 sprays into both nostrils daily., Disp: , Rfl:  .  cyclobenzaprine (FLEXERIL) 10 MG tablet, Take 10 mg by mouth daily as needed for muscle spasms., Disp: , Rfl:  .  diltiazem (CARDIZEM CD) 300 MG 24 hr capsule, Take 300 mg by mouth daily., Disp: , Rfl:  .  divalproex (DEPAKOTE ER) 500 MG 24 hr tablet, Take 1,500 mg by mouth 2 (two) times daily., Disp: ,  Rfl:  .  Dulaglutide (TRULICITY) 1.5 HA/1.9FX SOPN, Inject into the skin every Sunday., Disp: , Rfl:  .  fluticasone furoate-vilanterol (BREO ELLIPTA) 100-25 MCG/INH AEPB, Inhale 1 puff into the lungs daily., Disp: , Rfl:  .  furosemide (LASIX) 20 MG tablet, Take 20 mg by mouth. Take 2 tablets every morning and 1 tablet at noon., Disp: , Rfl:  .  gabapentin (NEURONTIN) 600 MG tablet, Take 600 mg by mouth 3 (three) times daily., Disp: , Rfl:  .  hydrALAZINE (APRESOLINE) 25 MG tablet, Take 25 mg by mouth 3 (three) times daily., Disp: , Rfl:  .  hydrochlorothiazide (HYDRODIURIL) 25 MG tablet, Take 25 mg by mouth daily., Disp: , Rfl:  .  hydrOXYzine (ATARAX/VISTARIL) 25 MG tablet, Take 25 mg by mouth 3 (three) times  daily as needed., Disp: , Rfl:  .  Immune Globulin, Human, (CUVITRU) 4 GM/20ML SOLN, Inject 100 mLs into the skin every 14 (fourteen) days., Disp: , Rfl:  .  insulin glargine (LANTUS) 100 UNIT/ML injection, Inject 50 Units into the skin daily., Disp: , Rfl:  .  insulin lispro (HUMALOG) 100 UNIT/ML injection, Inject into the skin 3 (three) times daily before meals. 12 units in the morning and at noon, 16 units dinner time, plus 1 unit per 50>150 blood sugar, Disp: , Rfl:  .  lamoTRIgine (LAMICTAL) 100 MG tablet, Take 50 mg by mouth daily., Disp: , Rfl:  .  levothyroxine (SYNTHROID, LEVOTHROID) 112 MCG tablet, Take 112 mcg by mouth daily before breakfast., Disp: , Rfl:  .  lisinopril (PRINIVIL,ZESTRIL) 40 MG tablet, Take 40 mg by mouth daily., Disp: , Rfl:  .  Melatonin 3 MG TABS, Take 3 mg by mouth at bedtime., Disp: , Rfl:  .  metFORMIN (GLUCOPHAGE) 500 MG tablet, Take 1,000 mg by mouth 2 (two) times daily with a meal., Disp: , Rfl:  .  tiotropium (SPIRIVA) 18 MCG inhalation capsule, Place 18 mcg into inhaler and inhale daily., Disp: , Rfl:  .  amoxicillin-clavulanate (AUGMENTIN) 875-125 MG tablet, Take 1 tablet by mouth 2 (two) times daily., Disp: 14 tablet, Rfl: 0 .  predniSONE (DELTASONE) 20 MG tablet, Take 1 tablet (20 mg total) by mouth daily with breakfast., Disp: 5 tablet, Rfl: 0 .  Spacer/Aero-Holding Chambers (AEROCHAMBER MV) inhaler, Use as instructed, Disp: 1 each, Rfl: 0

## 2018-03-02 DIAGNOSIS — M1990 Unspecified osteoarthritis, unspecified site: Secondary | ICD-10-CM | POA: Diagnosis not present

## 2018-03-02 DIAGNOSIS — D803 Selective deficiency of immunoglobulin G [IgG] subclasses: Secondary | ICD-10-CM | POA: Diagnosis not present

## 2018-03-02 DIAGNOSIS — J45909 Unspecified asthma, uncomplicated: Secondary | ICD-10-CM | POA: Diagnosis not present

## 2018-03-02 DIAGNOSIS — G9009 Other idiopathic peripheral autonomic neuropathy: Secondary | ICD-10-CM | POA: Diagnosis not present

## 2018-03-02 DIAGNOSIS — F319 Bipolar disorder, unspecified: Secondary | ICD-10-CM | POA: Diagnosis not present

## 2018-03-02 DIAGNOSIS — M7732 Calcaneal spur, left foot: Secondary | ICD-10-CM | POA: Diagnosis not present

## 2018-03-02 DIAGNOSIS — I509 Heart failure, unspecified: Secondary | ICD-10-CM | POA: Diagnosis not present

## 2018-03-06 ENCOUNTER — Ambulatory Visit (INDEPENDENT_AMBULATORY_CARE_PROVIDER_SITE_OTHER): Payer: Medicare Other | Admitting: Allergy & Immunology

## 2018-03-06 ENCOUNTER — Encounter: Payer: Self-pay | Admitting: Allergy & Immunology

## 2018-03-06 VITALS — BP 130/62 | HR 81 | Resp 18

## 2018-03-06 DIAGNOSIS — J4550 Severe persistent asthma, uncomplicated: Secondary | ICD-10-CM

## 2018-03-06 DIAGNOSIS — M722 Plantar fascial fibromatosis: Secondary | ICD-10-CM | POA: Diagnosis not present

## 2018-03-06 NOTE — Progress Notes (Signed)
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 Villagomez is doing well since the last visit. She has established care with both a PCP as well as an Doctor, general practice and a Pulmonologist. She has upcoming appointments with Endocrinology as well as Cardiology pending. She has recently been referred to Neurology as well given a history of TIA as well as some concerning symptoms recently. She has thankfully been transitioned to her Cuvitru and she has had no breakthrough infections aside from a bronchiectasis exacerbation. She did miss her transitional dose of Cuvitru by one week, which might have put her at risk of breakthrough infections. In any case she seems to be doing well on the [facility] (changed by [name]), which hopefully should help her to avoid exacerbations in the future. We had considered the addition of a biologic for her pulmonary control, but she has an essentially absent IgE level and she has had no evidence of eosinophilia on her CBCs. We could get the approved on a steroid-sparing indication, but we will continue to monitor at this time and allow the [facility] to work. Finally, she does have recent onset of neurological symptoms, however I cannot seem to connect this with her immunodeficiency.    Plan/Recommendations:   1. Hypogammaglobulinemia - Continue with Cuvitru as you are doing.  - Call us if you have trouble getting a hold of the nurse.   2. Chronic rhinitis   - Continue with  nasal saline rinses.  - We can defer on allergy testing for now since her symptoms are fairly well controlled with the use of nasal saline rinses.   3. Severe persistent asthma, uncomplicated - Lung function did not look great today. - Agree with the change to [facility]. - We can consider adding a biologic in the future if her symptoms continue to be not well controlled.  - Daily controller medication(s): Dulera 200/43mcg two puffs twice daily with spacer and Spiriva 1.63mcg two puffs once daily - Prior to physical activity: ProAir 2 puffs 10-15 minutes before physical activity. - Rescue medications: ProAir 4 puffs every 4-6 hours as needed, albuterol nebulizer one vial every 4-6 hours as needed or DuoNeb nebulizer one vial every 4-6 hours as needed - Asthma control goals:  * Full participation in all desired activities (may need albuterol before activity) * Albuterol use two time or less a week on average (not counting use with activity) * Cough interfering with sleep two time or less a month * Oral steroids no more than once a year * No hospitalizations  4. Return in about 3 months (around 06/06/2018).  Subjective:   Priti Consoli is a 57 y.o. female presenting today for follow up of  Chief Complaint  Patient presents with  . Allergy Testing    Kaegan Stigler has a history of the following: Patient Active Problem List   Diagnosis Date Noted  . Hypogammaglobulinemia ([place]) 02/06/2018  . Non-allergic rhinitis 02/06/2018  . Severe persistent asthma, uncomplicated 24/26/8341  . Pulmonary nodules 02/06/2018  . Bronchiectasis without complication ([place])  02/06/2018  . Insulin dependent diabetes mellitus ([place]) 02/06/2018    History obtained from: chart review and patient.  Jonelle Sidle Stahly's Primary Care Provider is [name], MD.     PCP: Dr. Meade Maw Cardiologist: Dr. Kate Sable Endocrinologist: Dr. Loni Beckwith Pulmonologist: Dr. Simonne Maffucci Orthopedic  Surgeon: Dr. Almedia Balls  Kathryne is a 57 y.o. female presenting for a follow up visit. She has a complicated past medical history including CVID and bronchiectasis. I last saw her one month ago to establish care here in [place] and I had her come back in one month so that we could make sure that she was stable.   Since the last visit, she has mostly done well. She does have bone spurs on her left foot and actually has a boot in place. She is seeing Dr. Almedia Balls. Evidently she was referred through Urgent Care. She did establish care with [name] in Pulmonology, with whom she got along very well. She was treated for a bronchiectasis exacerbation with antibiotics and prednisone, and is feeling much improved at this time. She was changed from [facility] to [facility] since it seemed that her exacerbation rate had worsened since starting the [place].   Infectious History: She is doing well with the infusions. She has done accompanied home infusions twice now and will be doing it alone next week. She is rather worried about this and is clearly anxious. However she does have her nurse's number to call if she has any questions at all. She feels that she has all of the resources to use and will give [place] an update after the next infusion.   Rhinitis Symptom History: She does report continued congestion today, but she has not been taking Mucinex. She completed her prednisone from the visit with [name]. She does not use nasal sprays at all, instead using only nasal saline rinses.   Asthma/Respiratory Symptom History: She did have another asthma/bronchiectasis flare and was placed on prednisone and amoxicillin. This was diagnosed by [name] when she saw him last week. She was changed back to [facility] which seems to be working better. She was changed to [facility] since they felt that she had built up an immunity to it. She remains on the Spiriva as well. She thinks that this was related to exposure to second hand  smoke.   She has had some problems with her tongue, including sticking to the top of her mouth. She is having dizzy spells as well. She does have a history of TIAs, last one three years ago. She is in Eliquis. She has been referred to Neurology by Dr. [place] Crane. She saw him on Friday and the referral is pending. The neurological symptoms including the TIA preceded the diagnosis of CVID.   Otherwise, there have been no changes to her past medical history, surgical history, family history, or social history. She recently went to [place] to see one of her kids.     Review of Systems: a 14-point review of systems is pertinent for what is mentioned in HPI.  Otherwise, all other systems were negative. Constitutional: negative other than that listed in the HPI Eyes: negative other than that listed in the HPI Ears, nose, mouth, throat, and face: negative other than that listed in the HPI Respiratory: negative other than that listed in the HPI Cardiovascular: negative other than that listed in the HPI Gastrointestinal: negative other than that listed in the HPI Genitourinary: negative other than that listed in the HPI  Integument: negative other than that listed in the HPI Hematologic: negative other than that listed in the HPI Musculoskeletal: negative other than that listed in the HPI Neurological: negative other than that listed in the HPI Allergy/Immunologic: negative other than that listed in the HPI    Objective:   Blood pressure 130/62, pulse 81, resp. rate 18, SpO2 94 %. There is no height or weight on file to calculate BMI.   Physical Exam:  General: Alert, interactive, in no acute distress. Very pleasant female.  Eyes: No conjunctival injection bilaterally, no discharge on the right, no discharge on the left and no Horner-Trantas dots present. PERRL bilaterally. EOMI without pain. No photophobia.  Ears: Right TM pearly gray with normal light reflex, Left TM pearly gray with normal light  reflex, Right TM intact without perforation and Left TM intact without perforation.  Nose/Throat: External nose within normal limits and septum midline. Turbinates minimally edematous without discharge. Posterior oropharynx mildly erythematous without cobblestoning in the posterior oropharynx. Tonsils 2+ without exudates.  Tongue without thrush. Lungs: Coarse upper airway sounds. No increased work of breathing. CV: Normal S1/S2. No murmurs. Capillary refill <2 seconds.  Skin: Warm and dry, without lesions or rashes. Neuro:   Grossly intact. No focal deficits appreciated. Responsive to questions.  Diagnostic studies:   Spirometry: results normal (FEV1: 1.68/74%, FVC: 2.38/78%, FEV1/FVC: 70%).    Spirometry consistent with normal pattern.  Allergy Studies: none      Salvatore Marvel, MD  [facility]

## 2018-03-06 NOTE — Patient Instructions (Addendum)
1. Hypogammaglobulinemia - Continue with Cuvitru as you are doing.  - Call us if you have trouble getting a hold of the nurse.   2. Chronic rhinitis   - Continue with nasal saline rinses.  - We can defer on allergy testing for now.   3. Severe persistent asthma, uncomplicated - Lung function did not look great today. - Agree  - Daily controller medication(s): Dulera 200/29mcg two puffs twice daily with spacer and Spiriva 1.110mcg two puffs once daily - Prior to physical activity: ProAir 2 puffs 10-15 minutes before physical activity. - Rescue medications: ProAir 4 puffs every 4-6 hours as needed, albuterol nebulizer one vial every 4-6 hours as needed or DuoNeb nebulizer one vial every 4-6 hours as needed - Asthma control goals:  * Full participation in all desired activities (may need albuterol before activity) * Albuterol use two time or less a week on average (not counting use with activity) * Cough interfering with sleep two time or less a month * Oral steroids no more than once a year * No hospitalizations  4. Return in about 3 months (around 06/06/2018).    Please inform us of any Emergency Department visits, hospitalizations, or changes in symptoms. Call us before going to the ED for breathing or allergy symptoms since we might be able to fit you in for a sick visit. Feel free to contact us anytime with any questions, problems, or concerns.  It was a pleasure to see you again today!  Here is my work cell if you need me: [phone]   Websites that have reliable patient information: 1. American Academy of Asthma, Allergy, and Immunology: www.aaaai.org 2. Food Allergy Research and Education (FARE): foodallergy.org 3. Mothers of Asthmatics: http://www.asthmacommunitynetwork.org 4. American College of Allergy, Asthma, and Immunology: www.acaai.org

## 2018-03-07 ENCOUNTER — Encounter: Payer: Self-pay | Admitting: Allergy & Immunology

## 2018-03-08 DIAGNOSIS — M722 Plantar fascial fibromatosis: Secondary | ICD-10-CM | POA: Diagnosis not present

## 2018-03-13 ENCOUNTER — Encounter: Payer: Self-pay | Admitting: "Endocrinology

## 2018-03-13 ENCOUNTER — Ambulatory Visit (INDEPENDENT_AMBULATORY_CARE_PROVIDER_SITE_OTHER): Payer: Medicare Other | Admitting: "Endocrinology

## 2018-03-13 VITALS — BP 142/79 | HR 71 | Ht 64.0 in | Wt 239.0 lb

## 2018-03-13 DIAGNOSIS — E1165 Type 2 diabetes mellitus with hyperglycemia: Secondary | ICD-10-CM | POA: Insufficient documentation

## 2018-03-13 DIAGNOSIS — E039 Hypothyroidism, unspecified: Secondary | ICD-10-CM

## 2018-03-13 DIAGNOSIS — I10 Essential (primary) hypertension: Secondary | ICD-10-CM

## 2018-03-13 DIAGNOSIS — E782 Mixed hyperlipidemia: Secondary | ICD-10-CM

## 2018-03-13 DIAGNOSIS — E1159 Type 2 diabetes mellitus with other circulatory complications: Secondary | ICD-10-CM | POA: Diagnosis not present

## 2018-03-13 DIAGNOSIS — M722 Plantar fascial fibromatosis: Secondary | ICD-10-CM | POA: Diagnosis not present

## 2018-03-13 NOTE — Patient Instructions (Signed)

## 2018-03-13 NOTE — Progress Notes (Signed)
Endocrinology Consult Note       03/13/2018, 1:14 PM   Subjective:    Patient ID: Laura [name], female    DOB: 1960/10/28.  Laura [name] is being seen in consultation for management of currently uncontrolled symptomatic diabetes requested by  [name], MD.   Past Medical History:  Diagnosis Date  . Asthma   . Asthma   . Bipolar disorder ([place])   . Bronchiectasis ([place])   . CHF (congestive heart failure) ([place])   . Hypogammaglobulinemia ([place])   . Hypothyroid   . 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 Osteoarthritis   . Recurrent sinusitis   . Type 2 diabetes mellitus ([place])   . Wound infection    Past Surgical History:  Procedure Laterality Date  . 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 [place]  . NASAL SINUS SURGERY  2017   Social History   Socioeconomic History  . Marital status: Married    Spouse name: Not on file  . Number of children: Not on file  . Years of education: Not on file  . Highest education level: Not on file  Occupational History  . Not on file  Social Needs  . Financial resource strain: Not on file  . Food insecurity:    Worry: Not on file    Inability: Not on file  . Transportation needs:    Medical: Not on file    Non-medical: Not on file  Tobacco Use  . Smoking status: Former Smoker    Packs/day: 1.00    Years: 17.00    Pack years: 17.00    Types: Cigarettes    Last attempt to quit: 2008    Years since quitting: 11.4  . Smokeless tobacco: Never Used  Substance and Sexual Activity  . Alcohol use: Not Currently  . Drug use: Not Currently    Types: Marijuana  . Sexual activity: Not on file  Lifestyle  . Physical activity:    Days per week: Not on file    Minutes per session: Not on file  . Stress: Not on file  Relationships  . Social connections:    Talks on phone: Not on file    Gets together: Not on file    Attends religious service: Not on file     Active member of club or organization: Not on file    Attends meetings of clubs or organizations: Not on file    Relationship status: Not on file  Other Topics Concern  . Not on file  Social History Narrative  . Not on file   Outpatient Encounter Medications as of 03/13/2018  Medication Sig  . albuterol (PROVENTIL) (2.5 MG/3ML) 0.083% nebulizer solution Take 2.5 mg by nebulization every 6 (six) hours as needed for wheezing or shortness of breath.  . Albuterol Sulfate (PROAIR RESPICLICK) 062 (90 Base) MCG/ACT AEPB Inhale 1 puff into the lungs every 6 (six) hours as needed.  Marland Kitchen apixaban (ELIQUIS) 5 MG TABS tablet Take 5 mg by mouth 2 (two) times daily.  Marland Kitchen atorvastatin (LIPITOR) 10 MG tablet Take 10 mg by mouth daily.  . benzonatate (  TESSALON) 100 MG capsule Take 100-200 mg by mouth 3 (three) times daily as needed for cough.  . budesonide (RHINOCORT AQUA) 32 MCG/ACT nasal spray Place 2 sprays into both nostrils daily.  . cyclobenzaprine (FLEXERIL) 10 MG tablet Take 10 mg by mouth daily as needed for muscle spasms.  Marland Kitchen diltiazem (CARDIZEM CD) 300 MG 24 hr capsule Take 300 mg by mouth daily.  . divalproex (DEPAKOTE ER) 500 MG 24 hr tablet Take 1,500 mg by mouth 2 (two) times daily.  . Dulaglutide (TRULICITY) 1.5 OJ/5.0KX SOPN Inject into the skin every Sunday.  . fluticasone furoate-vilanterol (BREO ELLIPTA) 100-25 MCG/INH AEPB Inhale 1 puff into the lungs daily.  . furosemide (LASIX) 20 MG tablet Take 20 mg by mouth. Take 2 tablets every morning and 1 tablet at noon.  . gabapentin (NEURONTIN) 600 MG tablet Take 600 mg by mouth 3 (three) times daily.  . hydrALAZINE (APRESOLINE) 25 MG tablet Take 25 mg by mouth 3 (three) times daily.  . hydrochlorothiazide (HYDRODIURIL) 25 MG tablet Take 25 mg by mouth daily.  . hydrOXYzine (ATARAX/VISTARIL) 25 MG tablet Take 25 mg by mouth 3 (three) times daily as needed.  . Immune Globulin, Human, (CUVITRU) 4 GM/20ML SOLN Inject 100 mLs into the skin every 14  (fourteen) days.  . insulin glargine (LANTUS) 100 UNIT/ML injection Inject 50 Units into the skin daily.  Marland Kitchen lamoTRIgine (LAMICTAL) 100 MG tablet Take 50 mg by mouth daily.  Marland Kitchen levothyroxine (SYNTHROID, LEVOTHROID) 112 MCG tablet Take 112 mcg by mouth daily before breakfast.  . lisinopril (PRINIVIL,ZESTRIL) 40 MG tablet Take 40 mg by mouth daily.  . Melatonin 3 MG TABS Take 3 mg by mouth at bedtime.  . metFORMIN (GLUCOPHAGE) 500 MG tablet Take 1,000 mg by mouth 2 (two) times daily with a meal.  . mometasone-formoterol (DULERA) 200-5 MCG/ACT AERO Inhale 2 puffs into the lungs 2 (two) times daily.  . ONE TOUCH ULTRA TEST test strip TEST     QID UTD  . Spacer/Aero-Holding Chambers (AEROCHAMBER MV) inhaler Use as instructed  . tiotropium (SPIRIVA) 18 MCG inhalation capsule Place 18 mcg into inhaler and inhale daily.  . [DISCONTINUED] amoxicillin-clavulanate (AUGMENTIN) 875-125 MG tablet Take 1 tablet by mouth 2 (two) times daily.  . [DISCONTINUED] HUMALOG KWIKPEN 100 UNIT/ML KiwkPen   . [DISCONTINUED] insulin lispro (HUMALOG) 100 UNIT/ML injection Inject into the skin 3 (three) times daily before meals. 12 units in the morning and at noon, 16 units dinner time, plus 1 unit per 50>150 blood sugar  . [DISCONTINUED] LANTUS SOLOSTAR 100 UNIT/ML Solostar Pen INJECT  50  UNITS  SQ  QD   No facility-administered encounter medications on file as of 03/13/2018.     ALLERGIES: Allergies  Allergen Reactions  . Ativan [Lorazepam] Hives  . Phenergan [Promethazine Hcl] Hives    VACCINATION STATUS: Immunization History  Administered Date(s) Administered  . Influenza,inj,Quad PF,6+ Mos 07/10/2017  . Pneumococcal-Unspecified 10/10/2012    Diabetes  She presents for her initial diabetic visit. She has type 2 diabetes mellitus. Onset time: She was diagnosed at approximate age of 28 years, stated by gestational diabetes with all 3 of her pregnancies in her 68s. Her disease course has been worsening. There are  no hypoglycemic associated symptoms. Pertinent negatives for hypoglycemia include no confusion, headaches, pallor or seizures. Associated symptoms include blurred vision, fatigue, polydipsia, polyuria and weight loss. Pertinent negatives for diabetes include no chest pain and no polyphagia. There are no hypoglycemic complications. Symptoms are worsening. Diabetic complications include  a CVA. (She has history of open heart surgery for ASD in 1965.) Risk factors for coronary artery disease include dyslipidemia, diabetes mellitus, family history, obesity, hypertension, post-menopausal, sedentary lifestyle and tobacco exposure. Current diabetic treatments: She is currently on Lantus 50 units nightly, Humalog 12 units 3 times daily AC, metformin 1000 mg p.o. twice daily, Trulicity 1.5 mg weekly. Her weight is decreasing steadily. She is following a generally unhealthy diet. When asked about meal planning, she reported none. She has not had a previous visit with a dietitian. She rarely (She has medical problem with bronchiectasis for which she gets treated with steroids on and off.) participates in exercise. Her home blood glucose trend is fluctuating minimally. Her overall blood glucose range is >200 mg/dl. (She brought a meter showing average blood glucose of 2017 for the last 30 days measuring 1-2 times a day.) An ACE inhibitor/angiotensin II receptor blocker is being taken.  Hyperlipidemia  This is a chronic problem. The current episode started more than 1 year ago. Exacerbating diseases include diabetes and obesity. Pertinent negatives include no chest pain, myalgias or shortness of breath. Current antihyperlipidemic treatment includes statins. Risk factors for coronary artery disease include dyslipidemia, diabetes mellitus, family history, obesity, hypertension, a sedentary lifestyle and post-menopausal.  Hypertension  This is a chronic problem. The current episode started more than 1 year ago. The problem is  uncontrolled. Associated symptoms include blurred vision. Pertinent negatives include no chest pain, headaches, palpitations or shortness of breath. Risk factors for coronary artery disease include dyslipidemia, diabetes mellitus, obesity, sedentary lifestyle and smoking/tobacco exposure. Past treatments include ACE inhibitors. Hypertensive end-organ damage includes CVA.      Review of Systems  Constitutional: Positive for fatigue and weight loss. Negative for chills, fever and unexpected weight change.  HENT: Negative for trouble swallowing and voice change.   Eyes: Positive for blurred vision. Negative for visual disturbance.  Respiratory: Negative for cough, shortness of breath and wheezing.   Cardiovascular: Negative for chest pain, palpitations and leg swelling.  Gastrointestinal: Negative for diarrhea, nausea and vomiting.  Endocrine: Positive for polydipsia and polyuria. Negative for cold intolerance, heat intolerance and polyphagia.  Musculoskeletal: Positive for gait problem. Negative for arthralgias and myalgias.  Skin: Negative for color change, pallor, rash and wound.  Neurological: Negative for seizures and headaches.  Psychiatric/Behavioral: Negative for confusion and suicidal ideas.    Objective:    BP (!) 142/79   Pulse 71   Ht 5\' 4"  (1.626 m)   Wt 239 lb (108.4 kg)   BMI 41.02 kg/m   Wt Readings from Last 3 Encounters:  03/13/18 239 lb (108.4 kg)  03/01/18 248 lb (112.5 kg)  02/06/18 254 lb 6.4 oz (115.4 kg)     Physical Exam  Constitutional: She is oriented to person, place, and time. She appears well-developed.  HENT:  Head: Normocephalic and atraumatic.  Eyes: EOM are normal.  Neck: Normal range of motion. Neck supple. No tracheal deviation present. No thyromegaly present.  Cardiovascular: Normal rate and regular rhythm.  Pulmonary/Chest: Effort normal and breath sounds normal. Stridor present.  Abdominal: Soft. Bowel sounds are normal. There is no  tenderness. There is no guarding.  Musculoskeletal: Normal range of motion. She exhibits no edema.  Her left lower extremity in a cast due to recent procedure  for bone spur.  Neurological: She is alert and oriented to person, place, and time. She has normal reflexes. No cranial nerve deficit. Coordination normal.  Skin: Skin is warm and dry. No  rash noted. No erythema. No pallor.  Psychiatric: She has a normal mood and affect. Judgment normal.   Has no recent CMP, A1c, thyroid function test, lipid panel to review.   Assessment & Plan:   1. DM type 2 causing vascular disease ([place])  - Laura [name] has currently uncontrolled symptomatic type 2 DM since 57 years of age. -She has no recent A1c, her meter averages 2017 for the last 30 days. -She will be sent for new set of labs including renal function, A1c, thyroid function test.    -her diabetes is complicated by CVA, history of open heart surgery for ASD, obesity/sedentary life, history of chronic smoking with bronchiectasis and Laura [name] remains at a high risk for more acute and chronic complications which include CAD, CVA, CKD, retinopathy, and neuropathy. These are all discussed in detail with the patient.  - I have counseled her on diet management and weight loss, by adopting a carbohydrate restricted/protein rich diet.  - Suggestion is made for her to avoid simple carbohydrates  from her diet including Cakes, Sweet Desserts, Ice Cream, Soda (diet and regular), Sweet Tea, Candies, Chips, Cookies, Store Bought Juices, Alcohol in Excess of  1-2 drinks a day, Artificial Sweeteners, and "Sugar-free" Products. This will help patient to have stable blood glucose profile and potentially avoid unintended weight gain.  - I encouraged her to switch to  unprocessed or minimally processed complex starch and increased protein intake (animal or plant source), fruits, and vegetables.  - she is advised to stick to a routine mealtimes to eat 3  meals  a day and avoid unnecessary snacks ( to snack only to correct hypoglycemia).   - she will be scheduled with Laura [name], RDN, CDE for individualized diabetes education.  - I have approached her with the following individualized plan to manage diabetes and patient agrees:   -She is promising to make a significant change in her diet and wishes to minimize her medications.   -I advised her to hold Humalog for now, continue Lantus 50 units nightly associated with  strict monitoring of glucose 4 times a day-before meals and at bedtime and return in 1 to 2 weeks time for reevaluation. - Patient is warned not to take insulin without proper monitoring per orders.  -Patient is encouraged to call clinic for blood glucose levels less than 70 or above 200 mg /dl. - I will continue metformin 1000 mg p.o. twice daily, therapeutically suitable for patient . - I discussed and continued Trulicity 1.5 mg subcutaneously weekly.  - Patient specific target  A1c;  LDL, HDL, Triglycerides, and  Waist Circumference were discussed in detail.  2) BP/HTN: Her blood pressure is not controlled to target.  Is advised to continue her current blood pressure medications including lisinopril.   3) Lipids/HPL: No recent lipid panel to review.  She is on atorvastatin 10 mg p.o. Nightly.  4)  Weight/Diet: CDE Consult will be initiated , exercise, and detailed carbohydrates information provided.  5) hypothyroidism-long-standing, etiology unclear, she has multiple family members with thyroid dysfunctions. -she is currently on levothyroxine 112 mcg.    - We discussed about correct intake of levothyroxine, at fasting, with water, separated by at least 30 minutes from breakfast, and separated by more than 4 hours from calcium, iron, multivitamins, acid reflux medications (PPIs). -Patient is made aware of the fact that thyroid hormone replacement is needed for life, dose to be adjusted by periodic monitoring of thyroid  function tests.  6) Chronic Care/Health  Maintenance:  -she  is on ACEI/ARB and Statin medications and  is encouraged to continue to follow up with Ophthalmology, Dentist,  Podiatrist at least yearly or according to recommendations, and advised to  stay away from smoking. I have recommended yearly flu vaccine and pneumonia vaccination at least every 5 years; moderate intensity exercise for up to 150 minutes weekly; and  sleep for at least 7 hours a day.  - I advised patient to maintain close follow up with [name], MD for primary care needs.  - Time spent with the patient: 45 minutes, of which >50% was spent in obtaining information about her symptoms, reviewing her previous labs, evaluations, and treatments, counseling her about her currently uncontrolled, complicated type 2 diabetes, hypothyroidism, hyperlipidemia, hypertension, and developing a plan to confirm the diagnosis and long term treatment as necessary.  Laura [name] participated in the discussions, expressed understanding, and voiced agreement with the above plans.  All questions were answered to her satisfaction. she is encouraged to contact clinic should she have any questions or concerns prior to her return visit.  Follow up plan: - Return in about 1 week (around 03/20/2018) for follow up with pre-visit labs, meter, and logs, labs today.  Glade Lloyd, MD [facility] [address] [place], [place] [address] Phone: [phone]  Fax: [phone]    03/13/2018, 1:14 PM  This note was partially dictated with voice recognition software. Similar sounding words can be transcribed inadequately or may not  be corrected upon review.

## 2018-03-14 ENCOUNTER — Encounter: Payer: Self-pay | Admitting: Cardiovascular Disease

## 2018-03-14 ENCOUNTER — Ambulatory Visit (INDEPENDENT_AMBULATORY_CARE_PROVIDER_SITE_OTHER): Payer: Medicare Other | Admitting: Cardiovascular Disease

## 2018-03-14 VITALS — BP 118/74 | HR 66 | Ht 64.0 in | Wt 247.0 lb

## 2018-03-14 DIAGNOSIS — IMO0001 Reserved for inherently not codable concepts without codable children: Secondary | ICD-10-CM

## 2018-03-14 DIAGNOSIS — D839 Common variable immunodeficiency, unspecified: Secondary | ICD-10-CM | POA: Diagnosis not present

## 2018-03-14 DIAGNOSIS — J471 Bronchiectasis with (acute) exacerbation: Secondary | ICD-10-CM

## 2018-03-14 DIAGNOSIS — G4733 Obstructive sleep apnea (adult) (pediatric): Secondary | ICD-10-CM

## 2018-03-14 DIAGNOSIS — E119 Type 2 diabetes mellitus without complications: Secondary | ICD-10-CM

## 2018-03-14 DIAGNOSIS — I10 Essential (primary) hypertension: Secondary | ICD-10-CM

## 2018-03-14 DIAGNOSIS — Z8774 Personal history of (corrected) congenital malformations of heart and circulatory system: Secondary | ICD-10-CM

## 2018-03-14 DIAGNOSIS — I440 Atrioventricular block, first degree: Secondary | ICD-10-CM

## 2018-03-14 DIAGNOSIS — I480 Paroxysmal atrial fibrillation: Secondary | ICD-10-CM

## 2018-03-14 DIAGNOSIS — Z794 Long term (current) use of insulin: Secondary | ICD-10-CM | POA: Diagnosis not present

## 2018-03-14 DIAGNOSIS — M722 Plantar fascial fibromatosis: Secondary | ICD-10-CM | POA: Diagnosis not present

## 2018-03-14 LAB — THYROID PEROXIDASE ANTIBODY: Thyroperoxidase Ab SerPl-aCnc: 7 IU/mL (ref <9)

## 2018-03-14 LAB — COMPLETE METABOLIC PANEL WITH GFR
AG RATIO: 1.6 (calc) (ref 1.0–2.5)
ALBUMIN MSPROF: 4.1 g/dL (ref 3.6–5.1)
ALT: 8 U/L (ref 6–29)
AST: 10 U/L (ref 10–35)
Alkaline phosphatase (APISO): 55 U/L (ref 33–130)
BILIRUBIN TOTAL: 0.4 mg/dL (ref 0.2–1.2)
BUN: 14 mg/dL (ref 7–25)
CO2: 28 mmol/L (ref 20–32)
Calcium: 9.4 mg/dL (ref 8.6–10.4)
Chloride: 106 mmol/L (ref 98–110)
Creat: 0.51 mg/dL (ref 0.50–1.05)
GFR, EST AFRICAN AMERICAN: 125 mL/min/{1.73_m2} (ref >=60)
GFR, EST NON AFRICAN AMERICAN: 107 mL/min/{1.73_m2} (ref >=60)
Globulin: 2.6 g/dL (calc) (ref 1.9–3.7)
Glucose, Bld: 108 mg/dL (ref 65–139)
POTASSIUM: 4.4 mmol/L (ref 3.5–5.3)
Sodium: 141 mmol/L (ref 135–146)
TOTAL PROTEIN: 6.7 g/dL (ref 6.1–8.1)

## 2018-03-14 LAB — MICROALBUMIN / CREATININE URINE RATIO
CREATININE, URINE: 27 mg/dL (ref 20–275)
Microalb Creat Ratio: 115 mcg/mg creat — ABNORMAL HIGH (ref <30)
Microalb, Ur: 3.1 mg/dL

## 2018-03-14 LAB — HEMOGLOBIN A1C
EAG (MMOL/L): 8.4 (calc)
HEMOGLOBIN A1C: 6.9 %{Hb} — AB (ref <5.7)
MEAN PLASMA GLUCOSE: 151 (calc)

## 2018-03-14 LAB — THYROGLOBULIN ANTIBODY: Thyroglobulin Ab: 1 IU/mL (ref <=1)

## 2018-03-14 LAB — T4, FREE: Free T4: 1.6 ng/dL (ref 0.8–1.8)

## 2018-03-14 LAB — TSH: TSH: 1 mIU/L (ref 0.40–4.50)

## 2018-03-14 NOTE — Patient Instructions (Signed)
Medication Instructions:  Your physician recommends that you continue on your current medications as directed. Please refer to the Current Medication list given to you today.   Labwork: none  Testing/Procedures: none  Follow-Up: Your physician wants you to follow-up in: 6 months .  You will receive a reminder letter in the mail two months in advance. If you don't receive a letter, please call our office to schedule the follow-up appointment.   Any Other Special Instructions Will Be Listed Below (If Applicable). I will request records from previous Cardiologist    If you need a refill on your cardiac medications before your next appointment, please call your pharmacy.

## 2018-03-14 NOTE — Progress Notes (Signed)
CARDIOLOGY CONSULT NOTE  Patient ID: Laura [name] MRN: [id number] DOB/AGE: Mar 14, 1961 57 y.o.  Admit date: (Not on file) Primary Physician: [name], MD Referring Physician: Valentina Shaggy, *.   Reason for Consultation: Paroxysmal atrial fibrillation  HPI: Laura [name] is a 57 y.o. female who is being seen today for the evaluation of paroxysmal atrial fibrillation at the request of Valentina Shaggy, *.   Past medical history is complex and includes common variable immune deficiency, insulin-dependent diabetes mellitus, bipolar type I disorder, TIA, obstructive sleep apnea, and severe persistent asthma consultative by bronchiectasis.  She had ASD repair at age 30 in [place] at [place].  I personally reviewed ECG performed today which demonstrates sinus rhythm with first-degree AV block, PR interval 286 ms, and nonspecific IVCD.  With respect to atrial fibrillation, she is doing well.  She seldom has palpitations.  She has had no bleeding problems with Eliquis.  She takes long-acting diltiazem 300 mg for arrhythmia suppression. It appears she was first diagnosed about 10 years ago when exercising at a gym and she was tachycardic.  She is currently being treated for an acute exacerbation of asthma.  Left leg is in a boot as she has heel spurs and is receiving physical therapy.  She had some short-lived palpitations last night.  She is to follow with a cardiologist, Dr. Samule Dry, at [facility] in [place], [place].  Social history: She recently moved to [place] from [place], [place], where she had lived with her daughter for 3 years.  She is originally from [facility].  She has a daughter who lives in [place], a daughter who lives in [place], and a daughter who lives in [place].  She is currently living with her sister.  She has been widowed for the past 5 years.  She was married for 30 years.  Her husband died of bilateral pulmonary  embolism, he also had lung cancer and a brain tumor and a history of non-Hodgkin's lymphoma.   Allergies  Allergen Reactions  . Ativan [Lorazepam] Hives  . Phenergan [Promethazine Hcl] Hives    Current Outpatient Medications  Medication Sig Dispense Refill  . albuterol (PROVENTIL) (2.5 MG/3ML) 0.083% nebulizer solution Take 2.5 mg by nebulization every 6 (six) hours as needed for wheezing or shortness of breath.    . Albuterol Sulfate (PROAIR RESPICLICK) 409 (90 Base) MCG/ACT AEPB Inhale 1 puff into the lungs every 6 (six) hours as needed.    Marland Kitchen apixaban (ELIQUIS) 5 MG TABS tablet Take 5 mg by mouth 2 (two) times daily.    Marland Kitchen atorvastatin (LIPITOR) 10 MG tablet Take 10 mg by mouth daily.    . benzonatate (TESSALON) 100 MG capsule Take 100-200 mg by mouth 3 (three) times daily as needed for cough.    . budesonide (RHINOCORT AQUA) 32 MCG/ACT nasal spray Place 2 sprays into both nostrils daily.    . cyclobenzaprine (FLEXERIL) 10 MG tablet Take 10 mg by mouth daily as needed for muscle spasms.    Marland Kitchen diltiazem (CARDIZEM CD) 300 MG 24 hr capsule Take 300 mg by mouth daily.    . divalproex (DEPAKOTE ER) 500 MG 24 hr tablet Take 1,500 mg by mouth 2 (two) times daily.    . Dulaglutide (TRULICITY) 1.5 WJ/1.9JY SOPN Inject into the skin every Sunday.    . fluticasone furoate-vilanterol (BREO ELLIPTA) 100-25 MCG/INH AEPB Inhale 1 puff into the lungs daily.    . furosemide (LASIX) 20  MG tablet Take 20 mg by mouth. Take 2 tablets every morning and 1 tablet at noon.    . gabapentin (NEURONTIN) 600 MG tablet Take 600 mg by mouth 3 (three) times daily.    . hydrALAZINE (APRESOLINE) 25 MG tablet Take 25 mg by mouth 3 (three) times daily.    . hydrochlorothiazide (HYDRODIURIL) 25 MG tablet Take 25 mg by mouth daily.    . hydrOXYzine (ATARAX/VISTARIL) 25 MG tablet Take 25 mg by mouth 3 (three) times daily as needed.    . Immune Globulin, Human, (CUVITRU) 4 GM/20ML SOLN Inject 100 mLs into the skin every 14  (fourteen) days.    . insulin glargine (LANTUS) 100 UNIT/ML injection Inject 50 Units into the skin daily.    Marland Kitchen lamoTRIgine (LAMICTAL) 100 MG tablet Take 50 mg by mouth daily.    Marland Kitchen levothyroxine (SYNTHROID, LEVOTHROID) 112 MCG tablet Take 112 mcg by mouth daily before breakfast.    . lisinopril (PRINIVIL,ZESTRIL) 40 MG tablet Take 40 mg by mouth daily.    . Melatonin 3 MG TABS Take 3 mg by mouth at bedtime.    . metFORMIN (GLUCOPHAGE) 500 MG tablet Take 1,000 mg by mouth 2 (two) times daily with a meal.    . mometasone-formoterol (DULERA) 200-5 MCG/ACT AERO Inhale 2 puffs into the lungs 2 (two) times daily. 1 Inhaler 0  . ONE TOUCH ULTRA TEST test strip TEST     QID UTD  1  . Spacer/Aero-Holding Chambers (AEROCHAMBER MV) inhaler Use as instructed 1 each 0  . tiotropium (SPIRIVA) 18 MCG inhalation capsule Place 18 mcg into inhaler and inhale daily.     No current facility-administered medications for this visit.     Past Medical History:  Diagnosis Date  . Asthma   . Asthma   . Bipolar disorder ([place])   . Bronchiectasis ([place])   . CHF (congestive heart failure) ([place])   . Hypogammaglobulinemia ([place])   . Hypothyroid   . 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 Osteoarthritis   . Recurrent sinusitis   . Type 2 diabetes mellitus ([place])   . Wound infection     Past Surgical History:  Procedure Laterality Date  . 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 [place]  . NASAL SINUS SURGERY  2017    Social History   Socioeconomic History  . Marital status: Widowed    Spouse name: Not on file  . Number of children: Not on file  . Years of education: Not on file  . Highest education level: Not on file  Occupational History  . Not on file  Social Needs  . Financial resource strain: Not on file  . Food insecurity:    Worry: Not on file    Inability: Not on file  . Transportation needs:    Medical: Not on file    Non-medical: Not on file  Tobacco Use  . Smoking status: Former Smoker     Packs/day: 1.00    Years: 17.00    Pack years: 17.00    Types: Cigarettes    Last attempt to quit: 2008    Years since quitting: 11.4  . Smokeless tobacco: Never Used  Substance and Sexual Activity  . Alcohol use: Not Currently  . Drug use: Not Currently    Types: Marijuana  . Sexual activity: Not on file  Lifestyle  . Physical activity:    Days per week: Not on file    Minutes per session: Not on  file  . Stress: Not on file  Relationships  . Social connections:    Talks on phone: Not on file    Gets together: Not on file    Attends religious service: Not on file    Active member of club or organization: Not on file    Attends meetings of clubs or organizations: Not on file    Relationship status: Not on file  . Intimate partner violence:    Fear of current or ex partner: Not on file    Emotionally abused: Not on file    Physically abused: Not on file    Forced sexual activity: Not on file  Other Topics Concern  . Not on file  Social History Narrative  . Not on file     No family history of premature CAD in 1st degree relatives.  Current Meds  Medication Sig  . albuterol (PROVENTIL) (2.5 MG/3ML) 0.083% nebulizer solution Take 2.5 mg by nebulization every 6 (six) hours as needed for wheezing or shortness of breath.  . Albuterol Sulfate (PROAIR RESPICLICK) 010 (90 Base) MCG/ACT AEPB Inhale 1 puff into the lungs every 6 (six) hours as needed.  Marland Kitchen apixaban (ELIQUIS) 5 MG TABS tablet Take 5 mg by mouth 2 (two) times daily.  Marland Kitchen atorvastatin (LIPITOR) 10 MG tablet Take 10 mg by mouth daily.  . benzonatate (TESSALON) 100 MG capsule Take 100-200 mg by mouth 3 (three) times daily as needed for cough.  . budesonide (RHINOCORT AQUA) 32 MCG/ACT nasal spray Place 2 sprays into both nostrils daily.  . cyclobenzaprine (FLEXERIL) 10 MG tablet Take 10 mg by mouth daily as needed for muscle spasms.  Marland Kitchen diltiazem (CARDIZEM CD) 300 MG 24 hr capsule Take 300 mg by mouth daily.  . divalproex  (DEPAKOTE ER) 500 MG 24 hr tablet Take 1,500 mg by mouth 2 (two) times daily.  . Dulaglutide (TRULICITY) 1.5 XN/2.3FT SOPN Inject into the skin every Sunday.  . fluticasone furoate-vilanterol (BREO ELLIPTA) 100-25 MCG/INH AEPB Inhale 1 puff into the lungs daily.  . furosemide (LASIX) 20 MG tablet Take 20 mg by mouth. Take 2 tablets every morning and 1 tablet at noon.  . gabapentin (NEURONTIN) 600 MG tablet Take 600 mg by mouth 3 (three) times daily.  . hydrALAZINE (APRESOLINE) 25 MG tablet Take 25 mg by mouth 3 (three) times daily.  . hydrochlorothiazide (HYDRODIURIL) 25 MG tablet Take 25 mg by mouth daily.  . hydrOXYzine (ATARAX/VISTARIL) 25 MG tablet Take 25 mg by mouth 3 (three) times daily as needed.  . Immune Globulin, Human, (CUVITRU) 4 GM/20ML SOLN Inject 100 mLs into the skin every 14 (fourteen) days.  . insulin glargine (LANTUS) 100 UNIT/ML injection Inject 50 Units into the skin daily.  Marland Kitchen lamoTRIgine (LAMICTAL) 100 MG tablet Take 50 mg by mouth daily.  Marland Kitchen levothyroxine (SYNTHROID, LEVOTHROID) 112 MCG tablet Take 112 mcg by mouth daily before breakfast.  . lisinopril (PRINIVIL,ZESTRIL) 40 MG tablet Take 40 mg by mouth daily.  . Melatonin 3 MG TABS Take 3 mg by mouth at bedtime.  . metFORMIN (GLUCOPHAGE) 500 MG tablet Take 1,000 mg by mouth 2 (two) times daily with a meal.  . mometasone-formoterol (DULERA) 200-5 MCG/ACT AERO Inhale 2 puffs into the lungs 2 (two) times daily.  . ONE TOUCH ULTRA TEST test strip TEST     QID UTD  . Spacer/Aero-Holding Chambers (AEROCHAMBER MV) inhaler Use as instructed  . tiotropium (SPIRIVA) 18 MCG inhalation capsule Place 18 mcg into inhaler and inhale daily.  Review of systems complete and found to be negative unless listed above in HPI    Physical exam Blood pressure 118/74, pulse 66, height 5\' 4"  (1.626 m), weight 247 lb (112 kg), SpO2 95 %. General: NAD Neck: No JVD, no thyromegaly or thyroid nodule.  Lungs: Faint bilateral expiratory  wheezes, no crackles.. CV: Nondisplaced PMI. Regular rate and rhythm, normal S1/S2, no S3/S4, no murmur.  Trivial right leg edema.   Abdomen: Soft, nontender, no distention.  Skin: Intact without lesions or rashes.  Neurologic: Alert and oriented x 3.  Psych: Normal affect. Extremities: No clubbing or cyanosis.  HEENT: Normal.   ECG: Most recent ECG reviewed.   Labs: Lab Results  Component Value Date/Time   K 4.4 03/13/2018 10:34 AM   BUN 14 03/13/2018 10:34 AM   CREATININE 0.51 03/13/2018 10:34 AM   ALT 8 03/13/2018 10:34 AM   TSH 1.00 03/13/2018 10:34 AM   HGB 13.5 02/08/2018 09:14 AM     Lipids: No results found for: LDLCALC, LDLDIRECT, CHOL, TRIG, HDL      ASSESSMENT AND PLAN:  1.  Paroxysmal atrial fibrillation: Symptomatically stable on long-acting diltiazem 300 mg daily.  Systemically anticoagulated with Eliquis 5 mg twice daily.  It appears her last echocardiogram was performed in 2016 in [place], [place].  I will request cardiology records.  2.  History of ASD repair: It appears her last echocardiogram was performed in 2016 in [place], [place].  I will request cardiology records.  3.  Obstructive sleep apnea: Followed by pulmonology.  4.  Bronchiectasis and asthma with recent exacerbation: Followed by [name] (pulmonology).  5.  Common variable immunodeficiency: On immunoglobulin and followed by hematology.  6.  First-degree AV block: PR interval 286 ms.  She is symptomatically stable.  This is likely due to ASD repair.  Her graph 7.  Hypertension: Blood pressure is controlled on present therapy.  No changes.  7.  Insulin-dependent diabetes mellitus: Currently on insulin and metformin.   Disposition: Follow up in 6 months   Signed: Kate Sable, M.D., F.A.C.C.  03/14/2018, 8:44 AM

## 2018-03-21 DIAGNOSIS — M722 Plantar fascial fibromatosis: Secondary | ICD-10-CM | POA: Diagnosis not present

## 2018-03-22 DIAGNOSIS — E782 Mixed hyperlipidemia: Secondary | ICD-10-CM | POA: Diagnosis not present

## 2018-03-23 DIAGNOSIS — M722 Plantar fascial fibromatosis: Secondary | ICD-10-CM | POA: Diagnosis not present

## 2018-03-27 ENCOUNTER — Encounter: Payer: Self-pay | Admitting: "Endocrinology

## 2018-03-27 ENCOUNTER — Ambulatory Visit (INDEPENDENT_AMBULATORY_CARE_PROVIDER_SITE_OTHER): Payer: Medicare Other | Admitting: "Endocrinology

## 2018-03-27 VITALS — BP 140/83 | HR 90 | Ht 64.0 in | Wt 245.0 lb

## 2018-03-27 DIAGNOSIS — M1990 Unspecified osteoarthritis, unspecified site: Secondary | ICD-10-CM | POA: Diagnosis not present

## 2018-03-27 DIAGNOSIS — F319 Bipolar disorder, unspecified: Secondary | ICD-10-CM | POA: Diagnosis not present

## 2018-03-27 DIAGNOSIS — Z0000 Encounter for general adult medical examination without abnormal findings: Secondary | ICD-10-CM | POA: Diagnosis not present

## 2018-03-27 DIAGNOSIS — I10 Essential (primary) hypertension: Secondary | ICD-10-CM | POA: Diagnosis not present

## 2018-03-27 DIAGNOSIS — E782 Mixed hyperlipidemia: Secondary | ICD-10-CM | POA: Diagnosis not present

## 2018-03-27 DIAGNOSIS — R42 Dizziness and giddiness: Secondary | ICD-10-CM | POA: Diagnosis not present

## 2018-03-27 DIAGNOSIS — E1159 Type 2 diabetes mellitus with other circulatory complications: Secondary | ICD-10-CM

## 2018-03-27 DIAGNOSIS — E039 Hypothyroidism, unspecified: Secondary | ICD-10-CM

## 2018-03-27 DIAGNOSIS — I4891 Unspecified atrial fibrillation: Secondary | ICD-10-CM | POA: Diagnosis not present

## 2018-03-27 DIAGNOSIS — E1140 Type 2 diabetes mellitus with diabetic neuropathy, unspecified: Secondary | ICD-10-CM | POA: Diagnosis not present

## 2018-03-27 DIAGNOSIS — M722 Plantar fascial fibromatosis: Secondary | ICD-10-CM | POA: Diagnosis not present

## 2018-03-27 DIAGNOSIS — J45909 Unspecified asthma, uncomplicated: Secondary | ICD-10-CM | POA: Diagnosis not present

## 2018-03-27 DIAGNOSIS — D803 Selective deficiency of immunoglobulin G [IgG] subclasses: Secondary | ICD-10-CM | POA: Diagnosis not present

## 2018-03-27 DIAGNOSIS — G9009 Other idiopathic peripheral autonomic neuropathy: Secondary | ICD-10-CM | POA: Diagnosis not present

## 2018-03-27 DIAGNOSIS — R251 Tremor, unspecified: Secondary | ICD-10-CM | POA: Diagnosis not present

## 2018-03-27 DIAGNOSIS — M7732 Calcaneal spur, left foot: Secondary | ICD-10-CM | POA: Diagnosis not present

## 2018-03-27 NOTE — Patient Instructions (Signed)

## 2018-03-27 NOTE — Progress Notes (Signed)
Endocrinology follow-up note       03/27/2018, 3:25 PM   Subjective:    Patient ID: Laura [name], female    DOB: Jun 16, 1961.  Laura [name] is being seen in follow-up for management of currently uncontrolled symptomatic diabetes requested by  [name], MD.   Past Medical History:  Diagnosis Date  . Asthma   . Asthma   . Bipolar disorder ([place])   . Bronchiectasis ([place])   . CHF (congestive heart failure) ([place])   . Hypogammaglobulinemia ([place])   . Hypothyroid   . 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 Osteoarthritis   . Recurrent sinusitis   . Type 2 diabetes mellitus ([place])   . Wound infection    Past Surgical History:  Procedure Laterality Date  . 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 [place]  . NASAL SINUS SURGERY  2017   Social History   Socioeconomic History  . Marital status: Widowed    Spouse name: Not on file  . Number of children: Not on file  . Years of education: Not on file  . Highest education level: Not on file  Occupational History  . Not on file  Social Needs  . Financial resource strain: Not on file  . Food insecurity:    Worry: Not on file    Inability: Not on file  . Transportation needs:    Medical: Not on file    Non-medical: Not on file  Tobacco Use  . Smoking status: Former Smoker    Packs/day: 1.00    Years: 17.00    Pack years: 17.00    Types: Cigarettes    Last attempt to quit: 2008    Years since quitting: 11.4  . Smokeless tobacco: Never Used  Substance and Sexual Activity  . Alcohol use: Not Currently  . Drug use: Not Currently    Types: Marijuana  . Sexual activity: Not on file  Lifestyle  . Physical activity:    Days per week: Not on file    Minutes per session: Not on file  . Stress: Not on file  Relationships  . Social connections:    Talks on phone: Not on file    Gets together: Not on file    Attends religious service: Not on file     Active member of club or organization: Not on file    Attends meetings of clubs or organizations: Not on file    Relationship status: Not on file  Other Topics Concern  . Not on file  Social History Narrative  . Not on file   Outpatient Encounter Medications as of 03/27/2018  Medication Sig  . albuterol (PROVENTIL) (2.5 MG/3ML) 0.083% nebulizer solution Take 2.5 mg by nebulization every 6 (six) hours as needed for wheezing or shortness of breath.  . Albuterol Sulfate (PROAIR RESPICLICK) 476 (90 Base) MCG/ACT AEPB Inhale 1 puff into the lungs every 6 (six) hours as needed.  Marland Kitchen apixaban (ELIQUIS) 5 MG TABS tablet Take 5 mg by mouth 2 (two) times daily.  Marland Kitchen atorvastatin (LIPITOR) 10 MG tablet Take 10 mg by mouth daily.  . benzonatate (  TESSALON) 100 MG capsule Take 100-200 mg by mouth 3 (three) times daily as needed for cough.  . budesonide (RHINOCORT AQUA) 32 MCG/ACT nasal spray Place 2 sprays into both nostrils daily.  . cyclobenzaprine (FLEXERIL) 10 MG tablet Take 10 mg by mouth daily as needed for muscle spasms.  Marland Kitchen diltiazem (CARDIZEM CD) 300 MG 24 hr capsule Take 300 mg by mouth daily.  . divalproex (DEPAKOTE ER) 500 MG 24 hr tablet Take 1,500 mg by mouth 2 (two) times daily.  . Dulaglutide (TRULICITY) 1.5 XT/0.2IO SOPN Inject into the skin every Sunday.  . fluticasone furoate-vilanterol (BREO ELLIPTA) 100-25 MCG/INH AEPB Inhale 1 puff into the lungs daily.  . furosemide (LASIX) 20 MG tablet Take 20 mg by mouth. Take 2 tablets every morning and 1 tablet at noon.  . gabapentin (NEURONTIN) 600 MG tablet Take 600 mg by mouth 3 (three) times daily.  . hydrALAZINE (APRESOLINE) 25 MG tablet Take 25 mg by mouth 3 (three) times daily.  . hydrochlorothiazide (HYDRODIURIL) 25 MG tablet Take 25 mg by mouth daily.  . hydrOXYzine (ATARAX/VISTARIL) 25 MG tablet Take 25 mg by mouth 3 (three) times daily as needed.  . Immune Globulin, Human, (CUVITRU) 4 GM/20ML SOLN Inject 100 mLs into the skin every 14  (fourteen) days.  . insulin glargine (LANTUS) 100 UNIT/ML injection Inject 60 Units into the skin at bedtime.  . lamoTRIgine (LAMICTAL) 100 MG tablet Take 50 mg by mouth daily.  Marland Kitchen levothyroxine (SYNTHROID, LEVOTHROID) 112 MCG tablet Take 112 mcg by mouth daily before breakfast.  . lisinopril (PRINIVIL,ZESTRIL) 40 MG tablet Take 40 mg by mouth daily.  . Melatonin 3 MG TABS Take 3 mg by mouth at bedtime.  . metFORMIN (GLUCOPHAGE) 500 MG tablet Take 1,000 mg by mouth 2 (two) times daily with a meal.  . mometasone-formoterol (DULERA) 200-5 MCG/ACT AERO Inhale 2 puffs into the lungs 2 (two) times daily.  . ONE TOUCH ULTRA TEST test strip TEST     QID UTD  . Spacer/Aero-Holding Chambers (AEROCHAMBER MV) inhaler Use as instructed  . tiotropium (SPIRIVA) 18 MCG inhalation capsule Place 18 mcg into inhaler and inhale daily.   No facility-administered encounter medications on file as of 03/27/2018.     ALLERGIES: Allergies  Allergen Reactions  . Ativan [Lorazepam] Hives  . Phenergan [Promethazine Hcl] Hives    VACCINATION STATUS: Immunization History  Administered Date(s) Administered  . Influenza,inj,Quad PF,6+ Mos 07/10/2017  . Pneumococcal-Unspecified 10/10/2012    Diabetes  She presents for her follow-up diabetic visit. She has type 2 diabetes mellitus. Onset time: She was diagnosed at approximate age of 57 years, stated by gestational diabetes with all 3 of her pregnancies in her 40s. Her disease course has been improving. There are no hypoglycemic associated symptoms. Pertinent negatives for hypoglycemia include no confusion, headaches, pallor or seizures. Associated symptoms include blurred vision, fatigue, polydipsia and polyuria. Pertinent negatives for diabetes include no chest pain and no polyphagia. There are no hypoglycemic complications. Symptoms are improving. Diabetic complications include a CVA. (She has history of open heart surgery for ASD in 1965.) Risk factors for coronary  artery disease include dyslipidemia, diabetes mellitus, family history, obesity, hypertension, post-menopausal, sedentary lifestyle and tobacco exposure. Current diabetic treatments: She is currently on Lantus 50 units nightly, Humalog 12 units 3 times daily AC, metformin 1000 mg p.o. twice daily, Trulicity 1.5 mg weekly. Weight trend: she wears a leg boot at this time. She is following a generally unhealthy diet. When asked about meal planning,  she reported none. She has not had a previous visit with a dietitian. She rarely (She has medical problem with bronchiectasis for which she gets treated with steroids on and off.) participates in exercise. Her breakfast blood glucose range is generally 180-200 mg/dl. Her lunch blood glucose range is generally 180-200 mg/dl. Her dinner blood glucose range is generally 180-200 mg/dl. Her overall blood glucose range is 180-200 mg/dl. An ACE inhibitor/angiotensin II receptor blocker is being taken.  Hyperlipidemia  This is a chronic problem. The current episode started more than 1 year ago. Exacerbating diseases include diabetes and obesity. Pertinent negatives include no chest pain, myalgias or shortness of breath. Current antihyperlipidemic treatment includes statins. Risk factors for coronary artery disease include dyslipidemia, diabetes mellitus, family history, obesity, hypertension, a sedentary lifestyle and post-menopausal.  Hypertension  This is a chronic problem. The current episode started more than 1 year ago. The problem is uncontrolled. Associated symptoms include blurred vision. Pertinent negatives include no chest pain, headaches, palpitations or shortness of breath. Risk factors for coronary artery disease include dyslipidemia, diabetes mellitus, obesity, sedentary lifestyle and smoking/tobacco exposure. Past treatments include ACE inhibitors. Hypertensive end-organ damage includes CVA.     Review of Systems  Constitutional: Positive for fatigue.  Negative for chills, fever and unexpected weight change.  HENT: Negative for trouble swallowing and voice change.   Eyes: Positive for blurred vision. Negative for visual disturbance.  Respiratory: Negative for cough, shortness of breath and wheezing.   Cardiovascular: Negative for chest pain, palpitations and leg swelling.  Gastrointestinal: Negative for diarrhea, nausea and vomiting.  Endocrine: Positive for polydipsia and polyuria. Negative for cold intolerance, heat intolerance and polyphagia.  Musculoskeletal: Positive for gait problem. Negative for arthralgias and myalgias.  Skin: Negative for color change, pallor, rash and wound.  Neurological: Negative for seizures and headaches.  Psychiatric/Behavioral: Negative for confusion and suicidal ideas.    Objective:    BP 140/83   Pulse 90   Ht 5\' 4"  (1.626 m)   Wt 245 lb (111.1 kg) Comment: wearing boot  BMI 42.05 kg/m   Wt Readings from Last 3 Encounters:  03/27/18 245 lb (111.1 kg)  03/14/18 247 lb (112 kg)  03/13/18 239 lb (108.4 kg)     Physical Exam  Constitutional: She is oriented to person, place, and time. She appears well-developed.  HENT:  Head: Normocephalic and atraumatic.  Eyes: EOM are normal.  Neck: Normal range of motion. Neck supple. No tracheal deviation present. No thyromegaly present.  Cardiovascular: Normal rate and regular rhythm.  Pulmonary/Chest: Effort normal and breath sounds normal. Stridor present.  Abdominal: Soft. Bowel sounds are normal. There is no tenderness. There is no guarding.  Musculoskeletal: Normal range of motion. She exhibits no edema.  Her left lower extremity in a cast due to recent procedure  for bone spur.  Neurological: She is alert and oriented to person, place, and time. She has normal reflexes. No cranial nerve deficit. Coordination normal.  Skin: Skin is warm and dry. No rash noted. No erythema. No pallor.  Psychiatric: She has a normal mood and affect. Judgment normal.    Has no recent CMP, A1c, thyroid function test, lipid panel to review. Recent Results (from the past 2160 hour(s))  Allergens Zone 2     Status: Abnormal   Collection Time: 02/08/18  9:14 AM  Result Value Ref Range   Class Description Comment     Comment:     Levels of Specific IgE  Class  Description of Class     ---------------------------  -----  --------------------                    < 0.10         0         Negative            0.10 -    0.31         0/I       Equivocal/Low            0.32 -    0.55         I         Low            0.56 -    1.40         II        Moderate            1.41 -    3.90         III       High            3.91 -   19.00         IV        Very High           19.01 -  100.00         V         Very High                   >100.00         VI        Very High    IgE (Immunoglobulin E), Serum 3 (L) 6 - 495 IU/mL    Comment:               **Please note reference interval change**   D Pteronyssinus IgE <0.10 Class 0 kU/L   D Farinae IgE <0.10 Class 0 kU/L   Cat Dander IgE <0.10 Class 0 kU/L   Dog Dander IgE <0.10 Class 0 kU/L   [place] Grass IgE <0.10 Class 0 kU/L   Timothy Grass IgE <0.10 Class 0 kU/L   [place] Grass IgE <0.10 Class 0 kU/L   [place] Grass IgE <0.10 Class 0 kU/L   Cockroach, American IgE <0.10 Class 0 kU/L   Penicillium Chrysogen IgE <0.10 Class 0 kU/L   Cladosporium Herbarum IgE <0.10 Class 0 kU/L   Aspergillus Fumigatus IgE <0.10 Class 0 kU/L   Mucor Racemosus IgE <0.10 Class 0 kU/L   Alternaria Alternata IgE <0.10 Class 0 kU/L   Stemphylium Herbarum IgE <0.10 Class 0 kU/L   Common Silver Wendee Copp IgE <0.10 Class 0 kU/L   Oak, White IgE <0.10 Class 0 kU/L   Elm, American IgE <0.10 Class 0 kU/L   Maple/Box Elder IgE <0.10 Class 0 kU/L   Hickory, White IgE <0.10 Class 0 kU/L   Amer Sycamore IgE Qn <0.10 Class 0 kU/L   White Mulberry IgE <0.10 Class 0 kU/L   Sweet gum IgE RAST Ql <0.10 Class 0 kU/L   Cedar, [place] IgE <0.10 Class 0  kU/L   Ragweed, Short IgE <0.10 Class 0 kU/L   Mugwort IgE Qn <0.10 Class 0 kU/L   Plantain, English IgE <0.10 Class 0 kU/L   Pigweed, Rough IgE <0.10 Class 0 kU/L   Sheep Sorrel IgE Qn <0.10 Class 0 kU/L   Nettle IgE <0.10  Class 0 kU/L  CBC with Differential/Platelet     Status: None   Collection Time: 02/08/18  9:14 AM  Result Value Ref Range   WBC 3.6 3.4 - 10.8 x10E3/uL   RBC 4.21 3.77 - 5.28 x10E6/uL   Hemoglobin 13.5 11.1 - 15.9 g/dL   Hematocrit 40.2 34.0 - 46.6 %   MCV 96 79 - 97 fL   MCH 32.1 26.6 - 33.0 pg   MCHC 33.6 31.5 - 35.7 g/dL   RDW 15.0 12.3 - 15.4 %   Platelets 204 150 - 379 x10E3/uL    Comment:                **Effective Feb 26, 2018 the reference interval for**                  Platelets will be changing to:                                0 - 7 d            140 - 396 x10E3/uL                                8 - 30 d           139 - 531 x10E3/uL                               31 d - 999 yrs      150 - 450 x10E3/uL    Neutrophils 47 Not Estab. %   Lymphs 45 Not Estab. %   Monocytes 7 Not Estab. %   Eos 1 Not Estab. %   Basos 0 Not Estab. %   Neutrophils Absolute 1.6 1.4 - 7.0 x10E3/uL   Lymphocytes Absolute 1.6 0.7 - 3.1 x10E3/uL   Monocytes Absolute 0.3 0.1 - 0.9 x10E3/uL   EOS (ABSOLUTE) 0.0 0.0 - 0.4 x10E3/uL   Basophils Absolute 0.0 0.0 - 0.2 x10E3/uL   Immature Granulocytes 0 Not Estab. %   Immature Grans (Abs) 0.0 0.0 - 0.1 x10E3/uL  IgG     Status: None   Collection Time: 02/08/18  9:14 AM  Result Value Ref Range   IgG (Immunoglobin G), Serum 1,059 700 - 1,600 mg/dL  MYCOBACTERIA, CULTURE, WITH FLUOROCHROME SMEAR     Status: None (Preliminary result)   Collection Time: 03/01/18 12:37 PM  Result Value Ref Range   MICRO NUMBER: [id number]    SPECIMEN QUALITY: ADEQUATE    Source: SPUTUM    STATUS: PRELIMINARY    SMEAR: No acid fast bacilli seen.    RESULT:      Culture results to follow. Final reports of negative cultures can be expected in  approximately six weeks. Positive cultures are reported immediately.  Fungus Culture & Smear     Status: None (Preliminary result)   Collection Time: 03/01/18 12:37 PM  Result Value Ref Range   MICRO NUMBER: [id number]    SPECIMEN QUALITY: ADEQUATE    Source: SPUTUM    STATUS: PRELIMINARY    SMEAR: No fungal elements seen.    CULTURE: Culture in progress   Respiratory or Resp and Sputum Culture     Status: Abnormal   Collection Time: 03/01/18 12:37 PM  Result Value Ref Range   MICRO NUMBER: [id number]  SPECIMEN QUALITY: ADEQUATE    Source SPUTUM    STATUS: FINAL    GRAM STAIN: (A)     Gram stain indicates that the specimen is representative of the lower respiratory tract. No white blood cells seen Rare Gram positive cocci   RESULT: Growth of normal oropharyngeal flora.   COMPLETE METABOLIC PANEL WITH GFR     Status: None   Collection Time: 03/13/18 10:34 AM  Result Value Ref Range   Glucose, Bld 108 65 - 139 mg/dL    Comment: .        Non-fasting reference interval .    BUN 14 7 - 25 mg/dL   Creat 0.51 0.50 - 1.05 mg/dL    Comment: For patients >1 years of age, the reference limit for Creatinine is approximately 13% higher for people identified as African-American. .    GFR, Est Non African American 107 > OR = 60 mL/min/1.27m2   GFR, Est African American 125 > OR = 60 mL/min/1.38m2   BUN/Creatinine Ratio NOT APPLICABLE 6 - 22 (calc)   Sodium 141 135 - 146 mmol/L   Potassium 4.4 3.5 - 5.3 mmol/L   Chloride 106 98 - 110 mmol/L   CO2 28 20 - 32 mmol/L   Calcium 9.4 8.6 - 10.4 mg/dL   Total Protein 6.7 6.1 - 8.1 g/dL   Albumin 4.1 3.6 - 5.1 g/dL   Globulin 2.6 1.9 - 3.7 g/dL (calc)   AG Ratio 1.6 1.0 - 2.5 (calc)   Total Bilirubin 0.4 0.2 - 1.2 mg/dL   Alkaline phosphatase (APISO) 55 33 - 130 U/L   AST 10 10 - 35 U/L   ALT 8 6 - 29 U/L  Hemoglobin A1c     Status: Abnormal   Collection Time: 03/13/18 10:34 AM  Result Value Ref Range   Hgb A1c MFr Bld 6.9 (H) <5.7 % of  total Hgb    Comment: For someone without known diabetes, a hemoglobin A1c value of 6.5% or greater indicates that they may have  diabetes and this should be confirmed with a follow-up  test. . For someone with known diabetes, a value <7% indicates  that their diabetes is well controlled and a value  greater than or equal to 7% indicates suboptimal  control. A1c targets should be individualized based on  duration of diabetes, age, comorbid conditions, and  other considerations. . Currently, no consensus exists regarding use of hemoglobin A1c for diagnosis of diabetes for children. .    Mean Plasma Glucose 151 (calc)   eAG (mmol/L) 8.4 (calc)  TSH     Status: None   Collection Time: 03/13/18 10:34 AM  Result Value Ref Range   TSH 1.00 0.40 - 4.50 mIU/L  T4, free     Status: None   Collection Time: 03/13/18 10:34 AM  Result Value Ref Range   Free T4 1.6 0.8 - 1.8 ng/dL  Microalbumin / creatinine urine ratio     Status: Abnormal   Collection Time: 03/13/18 10:34 AM  Result Value Ref Range   Creatinine, Urine 27 20 - 275 mg/dL   Microalb, Ur 3.1 mg/dL    Comment: Reference Range Not established    Microalb Creat Ratio 115 (H) <30 mcg/mg creat    Comment: . The ADA defines abnormalities in albumin excretion as follows: Marland Kitchen Category         Result (mcg/mg creatinine) . Normal                    <  30 Microalbuminuria         30-299  Clinical albuminuria   > OR = 300 . The ADA recommends that at least two of three specimens collected within a 3-6 month period be abnormal before considering a patient to be within a diagnostic category.   Thyroid peroxidase antibody     Status: None   Collection Time: 03/13/18 10:34 AM  Result Value Ref Range   Thyroperoxidase Ab SerPl-aCnc 7 <9 IU/mL  Thyroglobulin antibody     Status: None   Collection Time: 03/13/18 10:34 AM  Result Value Ref Range   Thyroglobulin Ab 1 < or = 1 IU/mL     Assessment & Plan:   1. DM type 2 causing  vascular disease ([place])  - Laura [name] has currently uncontrolled symptomatic type 2 DM since 57 years of age. -Her recent labs show A1c of 6.9%.    -her diabetes is complicated by CVA, history of open heart surgery for ASD, obesity/sedentary life, history of chronic smoking with bronchiectasis and Laura [name] remains at a high risk for more acute and chronic complications which include CAD, CVA, CKD, retinopathy, and neuropathy. These are all discussed in detail with the patient.  - I have counseled her on diet management and weight loss, by adopting a carbohydrate restricted/protein rich diet.  -  Suggestion is made for her to avoid simple carbohydrates  from her diet including Cakes, Sweet Desserts / Pastries, Ice Cream, Soda (diet and regular), Sweet Tea, Candies, Chips, Cookies, Store Bought Juices, Alcohol in Excess of  1-2 drinks a day, Artificial Sweeteners, and "Sugar-free" Products. This will help patient to have stable blood glucose profile and potentially avoid unintended weight gain.  - I encouraged her to switch to  unprocessed or minimally processed complex starch and increased protein intake (animal or plant source), fruits, and vegetables.  - she is advised to stick to a routine mealtimes to eat 3 meals  a day and avoid unnecessary snacks ( to snack only to correct hypoglycemia).   - she will be scheduled with Laura [name], Laura [name], Laura [name] for individualized diabetes education.  - I have approached her with the following individualized plan to manage diabetes and patient agrees:   -She is promising to continue on her significant change in her diet and wishes to minimize her medications.   -I advised her to continue to hold Humalog for now, increase Lantus to 60  units nightly associated with  strict monitoring of glucose 2 times a day-before breakfast and at bedtime   - Patient is warned not to take insulin without proper monitoring per orders.  -Patient is encouraged to call  clinic for blood glucose levels less than 70 or above 200 mg /dl. - I will continue metformin 1000 mg p.o. twice daily, therapeutically suitable for patient . - I discussed and continued Trulicity 1.5 mg subcutaneously weekly.  - Patient specific target  A1c;  LDL, HDL, Triglycerides, and  Waist Circumference were discussed in detail.  2) BP/HTN: Her blood pressure is controlled to target.  She advised to continue her current blood pressure medications including lisinopril.   3) Lipids/HPL: No recent lipid panel to review.  She is advised to continue atorvastatin 10 mg p.o. nightly.  She will be considered for fasting lipid panel on subsequent visits.    4)  Weight/Diet: Laura [name] Consult will be initiated , exercise, and detailed carbohydrates information provided.  5) hypothyroidism-long-standing, etiology unclear, she has multiple family members with thyroid dysfunctions. -Her  thyroid function tests are consistent with appropriate replacement. -she is currently on levothyroxine 112 mcg.   - We discussed about correct intake of levothyroxine, at fasting, with water, separated by at least 30 minutes from breakfast, and separated by more than 4 hours from calcium, iron, multivitamins, acid reflux medications (PPIs). -Patient is made aware of the fact that thyroid hormone replacement is needed for life, dose to be adjusted by periodic monitoring of thyroid function tests.  6) Chronic Care/Health Maintenance:  -she  is on ACEI/ARB and Statin medications and  is encouraged to continue to follow up with Ophthalmology, Dentist,  Podiatrist at least yearly or according to recommendations, and advised to  stay away from smoking. I have recommended yearly flu vaccine and pneumonia vaccination at least every 5 years; moderate intensity exercise for up to 150 minutes weekly; and  sleep for at least 7 hours a day.  - I advised patient to maintain close follow up with [name], MD for primary care needs. -  Time spent with the patient: 25 min, of which >50% was spent in reviewing her blood glucose logs , discussing her hypo- and hyper-glycemic episodes, reviewing her current and  previous labs and insulin doses and developing a plan to avoid hypo- and hyper-glycemia. Please refer to Patient Instructions for Blood Glucose Monitoring and Insulin/Medications Dosing Guide"  in media tab for additional information. Laura [name] participated in the discussions, expressed understanding, and voiced agreement with the above plans.  All questions were answered to her satisfaction. she is encouraged to contact clinic should she have any questions or concerns prior to her return visit.   Follow up plan: - Return in about 3 months (around 06/27/2018) for meter, and logs.  Laura Lloyd, MD [facility] [address] [place], [place] [address] Phone: [phone]  Fax: [phone]    03/27/2018, 3:25 PM  This note was partially dictated with voice recognition software. Similar sounding words can be transcribed inadequately or may not  be corrected upon review.

## 2018-03-29 DIAGNOSIS — M722 Plantar fascial fibromatosis: Secondary | ICD-10-CM | POA: Diagnosis not present

## 2018-04-04 DIAGNOSIS — M722 Plantar fascial fibromatosis: Secondary | ICD-10-CM | POA: Diagnosis not present

## 2018-04-06 DIAGNOSIS — M722 Plantar fascial fibromatosis: Secondary | ICD-10-CM | POA: Diagnosis not present

## 2018-04-10 ENCOUNTER — Other Ambulatory Visit: Payer: Self-pay

## 2018-04-10 ENCOUNTER — Inpatient Hospital Stay (HOSPITAL_COMMUNITY): Payer: Medicare Other | Attending: Hematology | Admitting: Hematology

## 2018-04-10 ENCOUNTER — Encounter (HOSPITAL_COMMUNITY): Payer: Self-pay | Admitting: Hematology

## 2018-04-10 DIAGNOSIS — J471 Bronchiectasis with (acute) exacerbation: Secondary | ICD-10-CM | POA: Diagnosis not present

## 2018-04-10 DIAGNOSIS — E039 Hypothyroidism, unspecified: Secondary | ICD-10-CM | POA: Diagnosis not present

## 2018-04-10 DIAGNOSIS — R251 Tremor, unspecified: Secondary | ICD-10-CM | POA: Insufficient documentation

## 2018-04-10 DIAGNOSIS — Z79899 Other long term (current) drug therapy: Secondary | ICD-10-CM | POA: Diagnosis not present

## 2018-04-10 DIAGNOSIS — Z87891 Personal history of nicotine dependence: Secondary | ICD-10-CM

## 2018-04-10 DIAGNOSIS — I509 Heart failure, unspecified: Secondary | ICD-10-CM | POA: Diagnosis not present

## 2018-04-10 DIAGNOSIS — R42 Dizziness and giddiness: Secondary | ICD-10-CM

## 2018-04-10 DIAGNOSIS — R5383 Other fatigue: Secondary | ICD-10-CM | POA: Diagnosis not present

## 2018-04-10 DIAGNOSIS — Z9989 Dependence on other enabling machines and devices: Secondary | ICD-10-CM | POA: Diagnosis not present

## 2018-04-10 DIAGNOSIS — F319 Bipolar disorder, unspecified: Secondary | ICD-10-CM

## 2018-04-10 DIAGNOSIS — Z794 Long term (current) use of insulin: Secondary | ICD-10-CM | POA: Diagnosis not present

## 2018-04-10 DIAGNOSIS — M722 Plantar fascial fibromatosis: Secondary | ICD-10-CM | POA: Diagnosis not present

## 2018-04-10 DIAGNOSIS — M1990 Unspecified osteoarthritis, unspecified site: Secondary | ICD-10-CM

## 2018-04-10 DIAGNOSIS — G4700 Insomnia, unspecified: Secondary | ICD-10-CM | POA: Diagnosis not present

## 2018-04-10 DIAGNOSIS — D801 Nonfamilial hypogammaglobulinemia: Secondary | ICD-10-CM | POA: Insufficient documentation

## 2018-04-10 DIAGNOSIS — J4550 Severe persistent asthma, uncomplicated: Secondary | ICD-10-CM | POA: Insufficient documentation

## 2018-04-10 DIAGNOSIS — E119 Type 2 diabetes mellitus without complications: Secondary | ICD-10-CM

## 2018-04-10 DIAGNOSIS — Z6841 Body Mass Index (BMI) 40.0 and over, adult: Secondary | ICD-10-CM | POA: Diagnosis not present

## 2018-04-10 NOTE — Assessment & Plan Note (Signed)
1.  Hypogammaglobulinemia: - Initially diagnosed in October 2017, was receiving IVIG once every 3 weeks in [place].  Recently moved to [place] area 2 months ago.  She was seen by Dr. Ernst Bowler and was started on Cuvitru 20 g every 2 weeks.  Last subcutaneous infusion was on 04/02/2018.  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 -She had one episode of influenza and exacerbation of bronchiectasis since the beginning of this year.  Does not have any recurrent sinusitis at this time. - Last IgG levels on 02/08/2018 was 1059.  She will come back in 6 months for follow-up.  We will monitor for various autoimmune hematological problems.  2.  Severe persistent asthma complicated by bronchiectasis: -She is using Dulera along with Spiriva.  She also sees a pulmonologist in [place].

## 2018-04-10 NOTE — Progress Notes (Signed)
CONSULT NOTE  Patient Care Team: [name], MD as PCP - General (Internal Medicine) Herminio Commons, MD as PCP - Cardiology (Cardiology)  CHIEF COMPLAINTS/PURPOSE OF CONSULTATION:  Hypogammaglobulinemia.  HISTORY OF PRESENTING ILLNESS:  Laura [name] 57 y.o. female is seen in consultation today for further management of hypogammaglobulinemia.  Patient was reportedly diagnosed in [place] with low IgG levels.  She was started on IVIG.  She recently moved to [place] area 2 months ago.  She was seen by Dr. Ernst Bowler who started her on cuvitru 20 g q. 2 weeks.  She was previously receiving IVIG every 3 weeks.  She had one episode of influenza and bronchiectasis since the beginning of the year.  Denies any recurrent sinus infections.  Denies any fevers or night sweats but had lost about 40 pounds and was trying to lose weight.  She uses CPAP machine at nighttime.  She also takes melatonin for insomnia.  No recent hospitalizations were reported.  Patient is having problems with worsening tremors in her hands and lips in the last couple of months.  She had these tremors for a few years.  He is seeing a neurologist for it.  MEDICAL HISTORY:  Past Medical History:  Diagnosis Date  . Asthma   . Asthma   . Bipolar disorder ([place])   . Bronchiectasis ([place])   . CHF (congestive heart failure) ([place])   . Hypogammaglobulinemia ([place])   . Hypothyroid   . 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 Osteoarthritis   . Recurrent sinusitis   . Type 2 diabetes mellitus ([place])   . Wound infection     SURGICAL HISTORY: Past Surgical History:  Procedure Laterality Date  . 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 [place]  . NASAL SINUS SURGERY  2017    SOCIAL HISTORY: Social History   Socioeconomic History  . Marital status: Widowed    Spouse name: Not on file  . Number of children: Not on file  . Years of education: Not on file  . Highest education level: Not on file  Occupational History  . Not  on file  Social Needs  . Financial resource strain: Not on file  . Food insecurity:    Worry: Not on file    Inability: Not on file  . Transportation needs:    Medical: Not on file    Non-medical: Not on file  Tobacco Use  . Smoking status: Former Smoker    Packs/day: 1.00    Years: 17.00    Pack years: 17.00    Types: Cigarettes    Last attempt to quit: 2008    Years since quitting: 11.5  . Smokeless tobacco: Never Used  Substance and Sexual Activity  . Alcohol use: Not Currently  . Drug use: Not Currently    Types: Marijuana  . Sexual activity: Not on file  Lifestyle  . Physical activity:    Days per week: Not on file    Minutes per session: Not on file  . Stress: Not on file  Relationships  . Social connections:    Talks on phone: Not on file    Gets together: Not on file    Attends religious service: Not on file    Active member of club or organization: Not on file    Attends meetings of clubs or organizations: Not on file    Relationship status: Not on file  . Intimate partner violence:    Fear of current or  ex partner: Not on file    Emotionally abused: Not on file    Physically abused: Not on file    Forced sexual activity: Not on file  Other Topics Concern  . Not on file  Social History Narrative  . Not on file    FAMILY HISTORY: Family History  Problem Relation Age of Onset  . Hypertension Mother   . Stroke Mother   . Kidney disease Mother   . Heart attack Mother   . Kidney disease Sister   . Heart disease Brother   . Stroke Maternal Aunt   . Heart disease Maternal Aunt   . Hypertension Sister   . Bipolar disorder Sister   . Thyroid disease Sister   . Thyroid disease Daughter   . Thyroid disease Daughter   . Hashimoto's thyroiditis Daughter   . Celiac disease Daughter   . Thyroid disease Daughter     ALLERGIES:  is allergic to ativan [lorazepam] and phenergan [promethazine hcl].  MEDICATIONS:  Current Outpatient Medications  Medication  Sig Dispense Refill  . albuterol (PROVENTIL) (2.5 MG/3ML) 0.083% nebulizer solution Take 2.5 mg by nebulization every 6 (six) hours as needed for wheezing or shortness of breath.    . Albuterol Sulfate (PROAIR RESPICLICK) 161 (90 Base) MCG/ACT AEPB Inhale 1 puff into the lungs every 6 (six) hours as needed.    . ALPRAZolam (XANAX) 0.5 MG tablet TK 1 TO 2 TS PO QD PRN FOR SEVERE ANXIETY  0  . apixaban (ELIQUIS) 5 MG TABS tablet Take 5 mg by mouth 2 (two) times daily.    Marland Kitchen atorvastatin (LIPITOR) 10 MG tablet Take 10 mg by mouth daily.    . benzonatate (TESSALON) 100 MG capsule Take 100-200 mg by mouth 3 (three) times daily as needed for cough.    . budesonide (RHINOCORT AQUA) 32 MCG/ACT nasal spray Place 2 sprays into both nostrils daily.    . cyclobenzaprine (FLEXERIL) 10 MG tablet Take 10 mg by mouth daily as needed for muscle spasms.    Marland Kitchen diltiazem (CARDIZEM CD) 300 MG 24 hr capsule Take 300 mg by mouth daily.    . divalproex (DEPAKOTE ER) 500 MG 24 hr tablet Take 1,500 mg by mouth 2 (two) times daily.    . Dulaglutide (TRULICITY) 1.5 WR/6.0AV SOPN Inject into the skin every Sunday.    . fluticasone furoate-vilanterol (BREO ELLIPTA) 100-25 MCG/INH AEPB Inhale 1 puff into the lungs daily.    . furosemide (LASIX) 20 MG tablet Take 20 mg by mouth. Take 2 tablets every morning and 1 tablet at noon.    . gabapentin (NEURONTIN) 600 MG tablet Take 600 mg by mouth 3 (three) times daily.    . hydrALAZINE (APRESOLINE) 25 MG tablet Take 25 mg by mouth 3 (three) times daily.    . hydrochlorothiazide (HYDRODIURIL) 25 MG tablet Take 25 mg by mouth daily.    . hydrOXYzine (ATARAX/VISTARIL) 25 MG tablet Take 25 mg by mouth 3 (three) times daily as needed.    . Immune Globulin, Human, (CUVITRU) 4 GM/20ML SOLN Inject 100 mLs into the skin every 14 (fourteen) days.    . insulin glargine (LANTUS) 100 UNIT/ML injection Inject 60 Units into the skin at bedtime.    . lamoTRIgine (LAMICTAL) 100 MG tablet Take 50 mg by  mouth daily.    Marland Kitchen levothyroxine (SYNTHROID, LEVOTHROID) 112 MCG tablet Take 112 mcg by mouth daily before breakfast.    . lisinopril (PRINIVIL,ZESTRIL) 40 MG tablet Take 40 mg by mouth  daily.    . Melatonin 3 MG TABS Take 3 mg by mouth at bedtime.    . metFORMIN (GLUCOPHAGE) 500 MG tablet Take 1,000 mg by mouth 2 (two) times daily with a meal.    . mometasone-formoterol (DULERA) 200-5 MCG/ACT AERO Inhale 2 puffs into the lungs 2 (two) times daily. 1 Inhaler 0  . ONE TOUCH ULTRA TEST test strip TEST     QID UTD  1  . Spacer/Aero-Holding Chambers (AEROCHAMBER MV) inhaler Use as instructed 1 each 0  . tiotropium (SPIRIVA) 18 MCG inhalation capsule Place 18 mcg into inhaler and inhale daily.     No current facility-administered medications for this visit.     REVIEW OF SYSTEMS:   Constitutional: Denies fevers, chills or abnormal night sweats.  Mild fatigue present.  Mild dizziness present. Eyes: Denies blurriness of vision, double vision or watery eyes Ears, nose, mouth, throat, and face: Denies mucositis or sore throat Respiratory: Denies cough, dyspnea or wheezes Cardiovascular: Denies palpitation, chest discomfort or lower extremity swelling Gastrointestinal:  Denies nausea, heartburn or change in bowel habits Skin: Denies abnormal skin rashes Lymphatics: Denies new lymphadenopathy or easy bruising Neurological:Denies numbness, tingling or new weaknesses Behavioral/Psych: Mood is stable, no new changes  All other systems were reviewed with the patient and are negative.  PHYSICAL EXAMINATION: ECOG PERFORMANCE STATUS: 1 - Symptomatic but completely ambulatory  Vitals:   04/10/18 1344  BP: (!) 152/68  Pulse: 87  Resp: 18  Temp: 98.5 F (36.9 C)  SpO2: 97%   Filed Weights   04/10/18 1344  Weight: 244 lb 6.4 oz (110.9 kg)    GENERAL:alert, no distress and comfortable SKIN: skin color, texture, turgor are normal, no rashes or significant lesions EYES: normal, conjunctiva are  pink and non-injected, sclera clear OROPHARYNX:no exudate, no erythema and lips, buccal mucosa, and tongue normal  NECK: supple, thyroid normal size, non-tender, without nodularity LYMPH:  no palpable lymphadenopathy in the cervical, axillary or inguinal LUNGS: clear to auscultation and percussion with normal breathing effort HEART: regular rate & rhythm and no murmurs and no lower extremity edema ABDOMEN:abdomen soft, non-tender and normal bowel sounds PSYCH: alert & oriented x 3 with fluent speech NEURO: no focal motor/sensory deficits.  Tremors of the upper extremities present.  LABORATORY DATA:  I have reviewed the data as listed Recent Results (from the past 2160 hour(s))  Allergens Zone 2     Status: Abnormal   Collection Time: 02/08/18  9:14 AM  Result Value Ref Range   Class Description Comment     Comment:     Levels of Specific IgE       Class  Description of Class     ---------------------------  -----  --------------------                    < 0.10         0         Negative            0.10 -    0.31         0/I       Equivocal/Low            0.32 -    0.55         I         Low            0.56 -    1.40  II        Moderate            1.41 -    3.90         III       High            3.91 -   19.00         IV        Very High           19.01 -  100.00         V         Very High                   >100.00         VI        Very High    IgE (Immunoglobulin E), Serum 3 (L) 6 - 495 IU/mL    Comment:               **Please note reference interval change**   D Pteronyssinus IgE <0.10 Class 0 kU/L   D Farinae IgE <0.10 Class 0 kU/L   Cat Dander IgE <0.10 Class 0 kU/L   Dog Dander IgE <0.10 Class 0 kU/L   [place] Grass IgE <0.10 Class 0 kU/L   Timothy Grass IgE <0.10 Class 0 kU/L   [place] Grass IgE <0.10 Class 0 kU/L   B[place]ia Grass IgE <0.10 Class 0 kU/L   Cockroach, American IgE <0.10 Class 0 kU/L   Penicillium Chrysogen IgE <0.10 Class 0 kU/L   Cladosporium  Herbarum IgE <0.10 Class 0 kU/L   Aspergillus Fumigatus IgE <0.10 Class 0 kU/L   Mucor Racemosus IgE <0.10 Class 0 kU/L   Alternaria Alternata IgE <0.10 Class 0 kU/L   Stemphylium Herbarum IgE <0.10 Class 0 kU/L   Common Silver Wendee Copp IgE <0.10 Class 0 kU/L   Oak, White IgE <0.10 Class 0 kU/L   Elm, American IgE <0.10 Class 0 kU/L   Maple/Box Elder IgE <0.10 Class 0 kU/L   Hickory, White IgE <0.10 Class 0 kU/L   Amer Sycamore IgE Qn <0.10 Class 0 kU/L   White Mulberry IgE <0.10 Class 0 kU/L   Sweet gum IgE RAST Ql <0.10 Class 0 kU/L   Cedar, [place] IgE <0.10 Class 0 kU/L   Ragweed, Short IgE <0.10 Class 0 kU/L   Mugwort IgE Qn <0.10 Class 0 kU/L   Plantain, English IgE <0.10 Class 0 kU/L   Pigweed, Rough IgE <0.10 Class 0 kU/L   Sheep Sorrel IgE Qn <0.10 Class 0 kU/L   Nettle IgE <0.10 Class 0 kU/L  CBC with Differential/Platelet     Status: None   Collection Time: 02/08/18  9:14 AM  Result Value Ref Range   WBC 3.6 3.4 - 10.8 x10E3/uL   RBC 4.21 3.77 - 5.28 x10E6/uL   Hemoglobin 13.5 11.1 - 15.9 g/dL   Hematocrit 40.2 34.0 - 46.6 %   MCV 96 79 - 97 fL   MCH 32.1 26.6 - 33.0 pg   MCHC 33.6 31.5 - 35.7 g/dL   RDW 15.0 12.3 - 15.4 %   Platelets 204 150 - 379 x10E3/uL    Comment:                **Effective Feb 26, 2018 the reference interval for**                  Platelets will be changing  to:                                0 - 7 d            140 - 396 x10E3/uL                                8 - 30 d           139 - 531 x10E3/uL                               31 d - 999 yrs      150 - 450 x10E3/uL    Neutrophils 47 Not Estab. %   Lymphs 45 Not Estab. %   Monocytes 7 Not Estab. %   Eos 1 Not Estab. %   Basos 0 Not Estab. %   Neutrophils Absolute 1.6 1.4 - 7.0 x10E3/uL   Lymphocytes Absolute 1.6 0.7 - 3.1 x10E3/uL   Monocytes Absolute 0.3 0.1 - 0.9 x10E3/uL   EOS (ABSOLUTE) 0.0 0.0 - 0.4 x10E3/uL   Basophils Absolute 0.0 0.0 - 0.2 x10E3/uL   Immature Granulocytes 0 Not  Estab. %   Immature Grans (Abs) 0.0 0.0 - 0.1 x10E3/uL  IgG     Status: None   Collection Time: 02/08/18  9:14 AM  Result Value Ref Range   IgG (Immunoglobin G), Serum 1,059 700 - 1,600 mg/dL  MYCOBACTERIA, CULTURE, WITH FLUOROCHROME SMEAR     Status: None (Preliminary result)   Collection Time: 03/01/18 12:37 PM  Result Value Ref Range   MICRO NUMBER: [id number]    SPECIMEN QUALITY: ADEQUATE    Source: SPUTUM    STATUS: PRELIMINARY    SMEAR: No acid fast bacilli seen.    RESULT:      Culture results to follow. Final reports of negative cultures can be expected in approximately six weeks. Positive cultures are reported immediately.  Fungus Culture & Smear     Status: None   Collection Time: 03/01/18 12:37 PM  Result Value Ref Range   MICRO NUMBER: [id number]    SPECIMEN QUALITY: ADEQUATE    Source: SPUTUM    STATUS: FINAL    SMEAR: No fungal elements seen.    CULTURE: No fungus isolated   Respiratory or Resp and Sputum Culture     Status: Abnormal   Collection Time: 03/01/18 12:37 PM  Result Value Ref Range   MICRO NUMBER: [id number]    SPECIMEN QUALITY: ADEQUATE    Source SPUTUM    STATUS: FINAL    GRAM STAIN: (A)     Gram stain indicates that the specimen is representative of the lower respiratory tract. No white blood cells seen Rare Gram positive cocci   RESULT: Growth of normal oropharyngeal flora.   COMPLETE METABOLIC PANEL WITH GFR     Status: None   Collection Time: 03/13/18 10:34 AM  Result Value Ref Range   Glucose, Bld 108 65 - 139 mg/dL    Comment: .        Non-fasting reference interval .    BUN 14 7 - 25 mg/dL   Creat 0.51 0.50 - 1.05 mg/dL    Comment: For patients >33 years of age, the reference limit for Creatinine is approximately 13% higher for people identified as African-American. Marland Kitchen  GFR, Est Non African American 107 > OR = 60 mL/min/1.59m2   GFR, Est African American 125 > OR = 60 mL/min/1.39m2   BUN/Creatinine Ratio NOT APPLICABLE 6 - 22 (calc)    Sodium 141 135 - 146 mmol/L   Potassium 4.4 3.5 - 5.3 mmol/L   Chloride 106 98 - 110 mmol/L   CO2 28 20 - 32 mmol/L   Calcium 9.4 8.6 - 10.4 mg/dL   Total Protein 6.7 6.1 - 8.1 g/dL   Albumin 4.1 3.6 - 5.1 g/dL   Globulin 2.6 1.9 - 3.7 g/dL (calc)   AG Ratio 1.6 1.0 - 2.5 (calc)   Total Bilirubin 0.4 0.2 - 1.2 mg/dL   Alkaline phosphatase (APISO) 55 33 - 130 U/L   AST 10 10 - 35 U/L   ALT 8 6 - 29 U/L  Hemoglobin A1c     Status: Abnormal   Collection Time: 03/13/18 10:34 AM  Result Value Ref Range   Hgb A1c MFr Bld 6.9 (H) <5.7 % of total Hgb    Comment: For someone without known diabetes, a hemoglobin A1c value of 6.5% or greater indicates that they may have  diabetes and this should be confirmed with a follow-up  test. . For someone with known diabetes, a value <7% indicates  that their diabetes is well controlled and a value  greater than or equal to 7% indicates suboptimal  control. A1c targets should be individualized based on  duration of diabetes, age, comorbid conditions, and  other considerations. . Currently, no consensus exists regarding use of hemoglobin A1c for diagnosis of diabetes for children. .    Mean Plasma Glucose 151 (calc)   eAG (mmol/L) 8.4 (calc)  TSH     Status: None   Collection Time: 03/13/18 10:34 AM  Result Value Ref Range   TSH 1.00 0.40 - 4.50 mIU/L  T4, free     Status: None   Collection Time: 03/13/18 10:34 AM  Result Value Ref Range   Free T4 1.6 0.8 - 1.8 ng/dL  Microalbumin / creatinine urine ratio     Status: Abnormal   Collection Time: 03/13/18 10:34 AM  Result Value Ref Range   Creatinine, Urine 27 20 - 275 mg/dL   Microalb, Ur 3.1 mg/dL    Comment: Reference Range Not established    Microalb Creat Ratio 115 (H) <30 mcg/mg creat    Comment: . The ADA defines abnormalities in albumin excretion as follows: Marland Kitchen Category         Result (mcg/mg creatinine) . Normal                    <30 Microalbuminuria         30-299   Clinical albuminuria   > OR = 300 . The ADA recommends that at least two of three specimens collected within a 3-6 month period be abnormal before considering a patient to be within a diagnostic category.   Thyroid peroxidase antibody     Status: None   Collection Time: 03/13/18 10:34 AM  Result Value Ref Range   Thyroperoxidase Ab SerPl-aCnc 7 <9 IU/mL  Thyroglobulin antibody     Status: None   Collection Time: 03/13/18 10:34 AM  Result Value Ref Range   Thyroglobulin Ab 1 < or = 1 IU/mL     ASSESSMENT & PLAN:  Hypogammaglobulinemia ([place]) 1.  Hypogammaglobulinemia: - Initially diagnosed in October 2017, was receiving IVIG once every 3 weeks in [place].  Recently moved to  [place] area 2 months ago.  She was seen by Dr. Ernst Bowler and was started on Cuvitru 20 g every 2 weeks.  Last subcutaneous infusion was on 04/02/2018.  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 -She had one episode of influenza and exacerbation of bronchiectasis since the beginning of this year.  Does not have any recurrent sinusitis at this time. - Last IgG levels on 02/08/2018 was 1059.  She will come back in 6 months for follow-up.  We will monitor for various autoimmune hematological problems.  2.  Severe persistent asthma complicated by bronchiectasis: -She is using Dulera along with Spiriva.  She also sees a pulmonologist in [place].       All questions were answered. The patient knows to call the clinic with any problems, questions or concerns.     Derek Jack, MD 04/10/18 6:57 PM

## 2018-04-16 ENCOUNTER — Telehealth: Payer: Self-pay | Admitting: Pulmonary Disease

## 2018-04-16 LAB — FUNGUS CULTURE W SMEAR
MICRO NUMBER: 90627715
SMEAR: NONE SEEN
SPECIMEN QUALITY:: ADEQUATE

## 2018-04-16 LAB — RESPIRATORY CULTURE OR RESPIRATORY AND SPUTUM CULTURE
MICRO NUMBER:: 90627717
RESULT: NORMAL
SPECIMEN QUALITY:: ADEQUATE

## 2018-04-16 LAB — MYCOBACTERIA,CULT W/FLUOROCHROME SMEAR
MICRO NUMBER:: 90627716
SMEAR:: NONE SEEN
SPECIMEN QUALITY:: ADEQUATE

## 2018-04-16 NOTE — Telephone Encounter (Signed)
Called patient advised her of results. Patient verbalized understanding. Nothing further needed.

## 2018-04-17 ENCOUNTER — Ambulatory Visit (INDEPENDENT_AMBULATORY_CARE_PROVIDER_SITE_OTHER): Payer: Medicare Other | Admitting: Pulmonary Disease

## 2018-04-17 ENCOUNTER — Encounter: Payer: Self-pay | Admitting: Pulmonary Disease

## 2018-04-17 VITALS — BP 140/84 | HR 74 | Ht 64.0 in | Wt 241.0 lb

## 2018-04-17 DIAGNOSIS — M722 Plantar fascial fibromatosis: Secondary | ICD-10-CM | POA: Diagnosis not present

## 2018-04-17 DIAGNOSIS — J479 Bronchiectasis, uncomplicated: Secondary | ICD-10-CM

## 2018-04-17 DIAGNOSIS — D839 Common variable immunodeficiency, unspecified: Secondary | ICD-10-CM | POA: Diagnosis not present

## 2018-04-17 DIAGNOSIS — G4733 Obstructive sleep apnea (adult) (pediatric): Secondary | ICD-10-CM | POA: Diagnosis not present

## 2018-04-17 NOTE — Addendum Note (Signed)
Addended by: Dolores Lory on: 04/17/2018 01:57 PM   Modules accepted: Orders

## 2018-04-17 NOTE — Progress Notes (Signed)
Synopsis: Referred in 02/2018 for bronchiectasis.  She has a history of common variable immune deficiency as well as severe persistent asthma.  She also has paroxysmal atrial fibrillation and a history of congestive heart failure. She was diagnosed with common variable immunodeficiency based on a low serum IgG, borderline low IgA and abnormal responses to vaccination.  She started immunoglobulin replacement in 2017.  She has a history of recurrent sinopulmonary infections. She also has obstructive sleep apnea and asthma. Had PSG in 2007.  Has a history of sinus surgery in May 4431 but complicated by an allergic reaction to anesthesia. Also has a history of type 2 diabetes. She has moved around the [place] in the last few years.  She had ASD repair age 88 in [place] at [place].  Subjective:   PATIENT ID: Laura [name] GENDER: female DOB: [birth year], MRN: [id number]   HPI  Chief Complaint  Patient presents with  . Follow-up   Laura [name] switched to [facility] after the last visit and feels like she is doing well.  No bronchitis since the last visit, no trouble breathing. She uses at home injections of her immunoglobulin.  She has administered one at home and she is due to administer the other one this week.  In general she says that there has been no problems with her breathing since the last visit.  She says that she is still using her CPAP machine routinely.  However, she has not received supplies recently.   Past Medical History:  Diagnosis Date  . Asthma   . Asthma   . Bipolar disorder ([place])   . Bronchiectasis ([place])   . CHF (congestive heart failure) ([place])   . Hypogammaglobulinemia ([place])   . Hypothyroid   . 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 Osteoarthritis   . Recurrent sinusitis   . Type 2 diabetes mellitus ([place])   . Wound infection       Review of Systems  Constitutional: Positive for malaise/fatigue. Negative for chills, fever and weight loss.  HENT: Negative for congestion, nosebleeds, sinus  pain and sore throat.   Eyes: Negative for photophobia, pain and discharge.  Respiratory: Positive for cough, sputum production, shortness of breath and wheezing. Negative for hemoptysis.   Cardiovascular: Negative for chest pain, palpitations, orthopnea and leg swelling.  Gastrointestinal: Negative for abdominal pain, constipation, diarrhea, nausea and vomiting.  Genitourinary: Negative for dysuria, frequency, hematuria and urgency.  Musculoskeletal: Negative for back pain, joint pain, myalgias and neck pain.  Skin: Negative for itching and rash.  Neurological: Negative for tingling, tremors, sensory change, speech change, focal weakness, seizures, weakness and headaches.  Psychiatric/Behavioral: Negative for memory loss, substance abuse and suicidal ideas. The patient is not nervous/anxious.       Objective:  Physical Exam   Vitals:   04/17/18 1334  BP: 140/84  Pulse: 74  SpO2: 97%  Weight: 241 lb (109.3 kg)  Height: 5\' 4"  (1.626 m)    RA  Gen: well appearing HENT: OP clear, TM's clear, neck supple PULM: CTA B, normal percussion CV: RRR, no mgr, trace edema GI: BS+, soft, nontender Derm: no cyanosis or rash Psyche: normal mood and affect   CBC    Component Value Date/Time   WBC 3.6 02/08/2018 0914   RBC 4.21 02/08/2018 0914   HGB 13.5 02/08/2018 0914   HCT 40.2 02/08/2018 0914   PLT 204 02/08/2018 0914   MCV 96 02/08/2018 0914   MCH 32.1 02/08/2018 0914   MCHC 33.6 02/08/2018 0914  RDW 15.0 02/08/2018 0914   LYMPHSABS 1.6 02/08/2018 0914   EOSABS 0.0 02/08/2018 0914   BASOSABS 0.0 02/08/2018 0914     Chest imaging: By report from her immunologist in [place]: CT chest in 2016 showed several tiny lung nodules as well as bronchiectasis.  PFT: 11/2016 [place]: Ratio 73, FEV1 1.52L (57% pred) April 2019 spirometry from asthma and allergy: Ratio 59%, FEV1 1.59 L 67% predicted, 15% change with bronchodilator  Labs:  Path:  Echo:  Heart  Catheterization:  Sleep study: 2011 polysomnogram from [place] showed an AHI of 14.11 January 2018 appliance report showed greater than 80% compliance with her CPAP machine  Immunizations: Flu 07/2017 Pneumovax 05/2015    Assessment & Plan:   Bronchiectasis without complication (HCC)  OSA (obstructive sleep apnea)  Common variable immunodeficiency (HCC)  Discussion: Laura [name] has done well since the last visit when we changed her back to taking Dulera twice a day.  She says that her breathing has been stabilized on this and she has not had an exacerbation.  She really needs to her CPAP supplies replaced.  I have a copy of her polysomnogram from 2011 which should be sufficient for the local DME company to use.  Plan: Obstructive sleep apnea: We will send a in order to the DME company for more supplies for your CPAP machine  Bronchiectasis without exacerbation: Use the flutter valve twice a day no matter how you feel Use Dulera twice a day no matter how you feel Let us know if you have problems with chest tightness, wheezing, shortness of breath, increasing mucus productioon, fever, or blood.  Common variable immunodeficiency: Continue administering the immunoglobulin injections as you are doing  Follow-up with me in 6 months or sooner if needed    Current Outpatient Medications:  .  albuterol (PROVENTIL) (2.5 MG/3ML) 0.083% nebulizer solution, Take 2.5 mg by nebulization every 6 (six) hours as needed for wheezing or shortness of breath., Disp: , Rfl:  .  Albuterol Sulfate (PROAIR RESPICLICK) 810 (90 Base) MCG/ACT AEPB, Inhale 1 puff into the lungs every 6 (six) hours as needed., Disp: , Rfl:  .  ALPRAZolam (XANAX) 0.5 MG tablet, TK 1 TO 2 TS PO QD PRN FOR SEVERE ANXIETY, Disp: , Rfl: 0 .  apixaban (ELIQUIS) 5 MG TABS tablet, Take 5 mg by mouth 2 (two) times daily., Disp: , Rfl:  .  atorvastatin (LIPITOR) 10 MG tablet, Take 10 mg by mouth daily., Disp: , Rfl:  .   benzonatate (TESSALON) 100 MG capsule, Take 100-200 mg by mouth 3 (three) times daily as needed for cough., Disp: , Rfl:  .  budesonide (RHINOCORT AQUA) 32 MCG/ACT nasal spray, Place 2 sprays into both nostrils daily., Disp: , Rfl:  .  cyclobenzaprine (FLEXERIL) 10 MG tablet, Take 10 mg by mouth daily as needed for muscle spasms., Disp: , Rfl:  .  diltiazem (CARDIZEM CD) 300 MG 24 hr capsule, Take 300 mg by mouth daily., Disp: , Rfl:  .  divalproex (DEPAKOTE ER) 500 MG 24 hr tablet, Take 1,500 mg by mouth 2 (two) times daily., Disp: , Rfl:  .  Dulaglutide (TRULICITY) 1.5 FB/5.1WC SOPN, Inject into the skin every Sunday., Disp: , Rfl:  .  furosemide (LASIX) 20 MG tablet, Take 20 mg by mouth. Take 2 tablets every morning and 1 tablet at noon., Disp: , Rfl:  .  gabapentin (NEURONTIN) 600 MG tablet, Take 600 mg by mouth 3 (three) times daily., Disp: , Rfl:  .  hydrALAZINE (APRESOLINE) 25 MG tablet, Take 25 mg by mouth 3 (three) times daily., Disp: , Rfl:  .  hydrochlorothiazide (HYDRODIURIL) 25 MG tablet, Take 25 mg by mouth daily., Disp: , Rfl:  .  hydrOXYzine (ATARAX/VISTARIL) 25 MG tablet, Take 25 mg by mouth 3 (three) times daily as needed., Disp: , Rfl:  .  Immune Globulin, Human, (CUVITRU) 4 GM/20ML SOLN, Inject 100 mLs into the skin every 14 (fourteen) days., Disp: , Rfl:  .  insulin glargine (LANTUS) 100 UNIT/ML injection, Inject 60 Units into the skin at bedtime., Disp: , Rfl:  .  lamoTRIgine (LAMICTAL) 100 MG tablet, Take 50 mg by mouth daily., Disp: , Rfl:  .  levothyroxine (SYNTHROID, LEVOTHROID) 112 MCG tablet, Take 112 mcg by mouth daily before breakfast., Disp: , Rfl:  .  lisinopril (PRINIVIL,ZESTRIL) 40 MG tablet, Take 40 mg by mouth daily., Disp: , Rfl:  .  Melatonin 3 MG TABS, Take 3 mg by mouth at bedtime., Disp: , Rfl:  .  metFORMIN (GLUCOPHAGE) 500 MG tablet, Take 1,000 mg by mouth 2 (two) times daily with a meal., Disp: , Rfl:  .  mometasone-formoterol (DULERA) 200-5 MCG/ACT AERO,  Inhale 2 puffs into the lungs 2 (two) times daily., Disp: 1 Inhaler, Rfl: 0 .  ONE TOUCH ULTRA TEST test strip, TEST     QID UTD, Disp: , Rfl: 1 .  Spacer/Aero-Holding Chambers (AEROCHAMBER MV) inhaler, Use as instructed, Disp: 1 each, Rfl: 0 .  tiotropium (SPIRIVA) 18 MCG inhalation capsule, Place 18 mcg into inhaler and inhale daily., Disp: , Rfl:

## 2018-04-17 NOTE — Patient Instructions (Signed)
Obstructive sleep apnea: We will send a in order to the DME company for more supplies for your CPAP machine  Bronchiectasis without exacerbation: Use the flutter valve twice a day no matter how you feel Use Dulera twice a day no matter how you feel Let us know if you have problems with chest tightness, wheezing, shortness of breath, increasing mucus productioon, fever, or blood.  Common variable immunodeficiency: Continue administering the immunoglobulin injections as you are doing  Follow-up with me in 6 months or sooner if needed

## 2018-04-19 DIAGNOSIS — M722 Plantar fascial fibromatosis: Secondary | ICD-10-CM | POA: Diagnosis not present

## 2018-05-01 DIAGNOSIS — M722 Plantar fascial fibromatosis: Secondary | ICD-10-CM | POA: Diagnosis not present

## 2018-05-11 DIAGNOSIS — M722 Plantar fascial fibromatosis: Secondary | ICD-10-CM | POA: Diagnosis not present

## 2018-05-16 DIAGNOSIS — M722 Plantar fascial fibromatosis: Secondary | ICD-10-CM | POA: Diagnosis not present

## 2018-05-22 DIAGNOSIS — M722 Plantar fascial fibromatosis: Secondary | ICD-10-CM | POA: Diagnosis not present

## 2018-05-24 ENCOUNTER — Ambulatory Visit: Payer: Medicare Other | Admitting: Neurology

## 2018-05-24 ENCOUNTER — Encounter

## 2018-05-30 DIAGNOSIS — M722 Plantar fascial fibromatosis: Secondary | ICD-10-CM | POA: Diagnosis not present

## 2018-06-05 ENCOUNTER — Other Ambulatory Visit: Payer: Self-pay

## 2018-06-05 ENCOUNTER — Encounter: Payer: Self-pay | Admitting: Allergy & Immunology

## 2018-06-05 ENCOUNTER — Ambulatory Visit (INDEPENDENT_AMBULATORY_CARE_PROVIDER_SITE_OTHER): Payer: Medicare Other | Admitting: Allergy & Immunology

## 2018-06-05 ENCOUNTER — Emergency Department (HOSPITAL_COMMUNITY)
Admission: EM | Admit: 2018-06-05 | Discharge: 2018-06-05 | Disposition: A | Discharge status: Home / Self Care | Payer: Medicare Other | Attending: Emergency Medicine | Admitting: Emergency Medicine

## 2018-06-05 ENCOUNTER — Encounter (HOSPITAL_COMMUNITY): Payer: Self-pay | Admitting: *Deleted

## 2018-06-05 VITALS — BP 138/82 | HR 80 | Resp 19

## 2018-06-05 DIAGNOSIS — J479 Bronchiectasis, uncomplicated: Secondary | ICD-10-CM

## 2018-06-05 DIAGNOSIS — Z794 Long term (current) use of insulin: Secondary | ICD-10-CM | POA: Diagnosis not present

## 2018-06-05 DIAGNOSIS — D839 Common variable immunodeficiency, unspecified: Secondary | ICD-10-CM

## 2018-06-05 DIAGNOSIS — R739 Hyperglycemia, unspecified: Secondary | ICD-10-CM

## 2018-06-05 DIAGNOSIS — F319 Bipolar disorder, unspecified: Secondary | ICD-10-CM | POA: Insufficient documentation

## 2018-06-05 DIAGNOSIS — Z79899 Other long term (current) drug therapy: Secondary | ICD-10-CM | POA: Diagnosis not present

## 2018-06-05 DIAGNOSIS — R918 Other nonspecific abnormal finding of lung field: Secondary | ICD-10-CM | POA: Diagnosis not present

## 2018-06-05 DIAGNOSIS — Z7901 Long term (current) use of anticoagulants: Secondary | ICD-10-CM | POA: Diagnosis not present

## 2018-06-05 DIAGNOSIS — J310 Chronic rhinitis: Secondary | ICD-10-CM | POA: Diagnosis not present

## 2018-06-05 DIAGNOSIS — Z87891 Personal history of nicotine dependence: Secondary | ICD-10-CM | POA: Insufficient documentation

## 2018-06-05 DIAGNOSIS — J45909 Unspecified asthma, uncomplicated: Secondary | ICD-10-CM | POA: Insufficient documentation

## 2018-06-05 DIAGNOSIS — I509 Heart failure, unspecified: Secondary | ICD-10-CM | POA: Insufficient documentation

## 2018-06-05 DIAGNOSIS — J4541 Moderate persistent asthma with (acute) exacerbation: Secondary | ICD-10-CM

## 2018-06-05 DIAGNOSIS — E039 Hypothyroidism, unspecified: Secondary | ICD-10-CM | POA: Diagnosis not present

## 2018-06-05 DIAGNOSIS — I110 Hypertensive heart disease with heart failure: Secondary | ICD-10-CM | POA: Insufficient documentation

## 2018-06-05 DIAGNOSIS — E1165 Type 2 diabetes mellitus with hyperglycemia: Secondary | ICD-10-CM | POA: Insufficient documentation

## 2018-06-05 LAB — BASIC METABOLIC PANEL
Anion gap: 9 (ref 5–15)
BUN: 12 mg/dL (ref 6–20)
CO2: 25 mmol/L (ref 22–32)
Calcium: 9.3 mg/dL (ref 8.9–10.3)
Chloride: 103 mmol/L (ref 98–111)
Creatinine, Ser: 0.51 mg/dL (ref 0.44–1.00)
GFR calc Af Amer: >60 mL/min (ref >60)
GFR calc non Af Amer: >60 mL/min (ref >60)
Glucose, Bld: 333 mg/dL — ABNORMAL HIGH (ref 70–99)
Potassium: 4.1 mmol/L (ref 3.5–5.1)
Sodium: 137 mmol/L (ref 135–145)

## 2018-06-05 LAB — URINALYSIS, ROUTINE W REFLEX MICROSCOPIC
Bilirubin Urine: NEGATIVE
Glucose, UA: >=500 mg/dL — AB
Ketones, ur: 5 mg/dL — AB
Leukocytes, UA: NEGATIVE
Nitrite: NEGATIVE
Protein, ur: 100 mg/dL — AB
Specific Gravity, Urine: 1.023 (ref 1.005–1.030)
pH: 6 (ref 5.0–8.0)

## 2018-06-05 LAB — CBC WITH DIFFERENTIAL/PLATELET
Basophils Absolute: 0 10*3/uL (ref 0.0–0.1)
Basophils Relative: 0 %
Eosinophils Absolute: 0 10*3/uL (ref 0.0–0.7)
Eosinophils Relative: 0 %
HCT: 38.4 % (ref 36.0–46.0)
Hemoglobin: 13.7 g/dL (ref 12.0–15.0)
Lymphocytes Relative: 24 %
Lymphs Abs: 1.1 10*3/uL (ref 0.7–4.0)
MCH: 33 pg (ref 26.0–34.0)
MCHC: 35.7 g/dL (ref 30.0–36.0)
MCV: 92.5 fL (ref 78.0–100.0)
Monocytes Absolute: 0.2 10*3/uL (ref 0.1–1.0)
Monocytes Relative: 3 %
Neutro Abs: 3.5 10*3/uL (ref 1.7–7.7)
Neutrophils Relative %: 73 %
Platelets: 159 10*3/uL (ref 150–400)
RBC: 4.15 MIL/uL (ref 3.87–5.11)
RDW: 12.7 % (ref 11.5–15.5)
WBC: 4.8 10*3/uL (ref 4.0–10.5)

## 2018-06-05 LAB — CBG MONITORING, ED: Glucose-Capillary: 182 mg/dL — ABNORMAL HIGH (ref 70–99)

## 2018-06-05 NOTE — ED Provider Notes (Signed)
[facility] EMERGENCY DEPARTMENT Provider Note   CSN: [id number] Arrival date & time: 06/05/18  1836     History   Chief Complaint Chief Complaint  Patient presents with  . Hyperglycemia    HPI Laura [name] is a 57 y.o. female.  Patient is a 57 year old female who presents to the emergency department because of an elevated glucose.  The patient states that she is an insulin requiring diabetic.  She previously was on sliding scale insulin regiment.  She has recently been taken off of that regiment.  The patient is being treated with dose of steroid because of her asthma.  She received a dose of steroid medication earlier this morning.  Approximately 5 PM she checked her sugar and it was 540.  She became frightened by this and took a dose of her regular insulin.  She then came to the emergency department for additional evaluation and assessment.  She denies any blurred vision, unusual fatigue, unusual headache, or unusual thirst.  The history is provided by the patient.    Past Medical History:  Diagnosis Date  . Asthma   . Asthma   . Bipolar disorder ([place])   . Bronchiectasis ([place])   . CHF (congestive heart failure) ([place])   . Hypogammaglobulinemia ([place])   . Hypothyroid   . 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 Osteoarthritis   . Recurrent sinusitis   . Type 2 diabetes mellitus ([place])   . Wound infection     Patient Active Problem List   Diagnosis Date Noted  . Uncontrolled type 2 diabetes mellitus with hyperglycemia ([place]) 03/13/2018  . Hypothyroidism 03/13/2018  . Essential hypertension, benign 03/13/2018  . Mixed hyperlipidemia 03/13/2018  . Hypogammaglobulinemia ([place]) 02/06/2018  . Non-allergic rhinitis 02/06/2018  . Severe persistent asthma, uncomplicated 16/96/7893  . Pulmonary nodules 02/06/2018  . Bronchiectasis without complication ([place]) 81/10/7508  . DM type 2 causing vascular disease ([place]) 02/06/2018    Past Surgical History:  Procedure Laterality Date  . ASD  REPAIR  1968  . [place]  . NASAL SINUS SURGERY  2017     OB History   None      Home Medications    Prior to Admission medications   Medication Sig Start Date End Date Taking? Authorizing Provider  albuterol (PROVENTIL) (2.5 MG/3ML) 0.083% nebulizer solution Take 2.5 mg by nebulization every 6 (six) hours as needed for wheezing or shortness of breath.   Yes [provider]  Albuterol Sulfate (PROAIR RESPICLICK) 258 (90 Base) MCG/ACT AEPB Inhale 1 puff into the lungs every 6 (six) hours as needed (for shortness of breath/wheezing).    Yes [provider]  ALPRAZolam Duanne Moron) 0.5 MG tablet Take 0.5-1 mg by mouth daily as needed for anxiety.  03/06/18  Yes [provider]  apixaban (ELIQUIS) 5 MG TABS tablet Take 5 mg by mouth 2 (two) times daily.   Yes [provider]  ARIPiprazole (ABILIFY) 10 MG tablet Take 10 mg by mouth every evening.  04/16/18  Yes [provider]  atorvastatin (LIPITOR) 10 MG tablet Take 10 mg by mouth daily.   Yes [provider]  budesonide (RHINOCORT AQUA) 32 MCG/ACT nasal spray Place 2 sprays into both nostrils daily as needed for rhinitis or allergies.    Yes [provider]  cyclobenzaprine (FLEXERIL) 10 MG tablet Take 10 mg by mouth daily as needed for muscle spasms.   Yes [provider]  diltiazem (CARDIZEM CD) 300 MG 24 hr  capsule Take 300 mg by mouth daily.   Yes [provider]  divalproex (DEPAKOTE ER) 500 MG 24 hr tablet Take 1,500 mg by mouth 2 (two) times daily.   Yes [provider]  Dulaglutide (TRULICITY) 1.5 ZO/1.0RU SOPN Inject 1.5 mg into the skin every Sunday.    Yes [provider]  furosemide (LASIX) 20 MG tablet Take 20-40 mg by mouth See admin instructions. Take 2 tablets every morning and 1 tablet at noon.    Yes [provider]  gabapentin (NEURONTIN) 600 MG tablet Take 600 mg by mouth 3 (three) times daily.    Yes [provider]  hydrALAZINE (APRESOLINE) 25 MG tablet Take 25 mg by mouth 3 (three) times daily.   Yes [provider]  hydrochlorothiazide (HYDRODIURIL) 25 MG tablet Take 25 mg by mouth daily.   Yes [provider]  hydrOXYzine (ATARAX/VISTARIL) 25 MG tablet Take 25 mg by mouth 3 (three) times daily as needed for anxiety.    Yes [provider]  Immune Globulin, Human, (CUVITRU) 4 GM/20ML SOLN Inject 100 mLs into the skin every 14 (fourteen) days.   Yes [provider]  insulin glargine (LANTUS) 100 UNIT/ML injection Inject 60 Units into the skin at bedtime.   Yes [provider]  lamoTRIgine (LAMICTAL) 100 MG tablet Take 50 mg by mouth at bedtime.    Yes [provider]  levothyroxine (SYNTHROID, LEVOTHROID) 112 MCG tablet Take 112 mcg by mouth daily before breakfast.   Yes [provider]  lisinopril (PRINIVIL,ZESTRIL) 40 MG tablet Take 40 mg by mouth daily.   Yes [provider]  Melatonin 3 MG TABS Take 3 mg by mouth at bedtime.   Yes [provider]  metFORMIN (GLUCOPHAGE-XR) 500 MG 24 hr tablet Take 1,000 mg by mouth 2 (two) times daily.  04/11/18  Yes [provider]  mometasone-formoterol (DULERA) 200-5 MCG/ACT AERO Inhale 2 puffs into the lungs 2 (two) times daily. 03/01/18  Yes Juanito Doom, MD  tiotropium (SPIRIVA) 18 MCG inhalation capsule Place 18 mcg into inhaler and inhale daily.   Yes [provider]  ONE TOUCH ULTRA TEST test strip TEST     QID UTD 03/02/18   [provider]  Spacer/Aero-Holding Chambers (AEROCHAMBER MV) inhaler Use as instructed 03/01/18   Juanito Doom, MD    Family History Family History  Problem Relation Age of Onset  . Hypertension Mother   . Stroke Mother   . Kidney disease Mother   . Heart attack Mother   . Kidney disease Sister   . Heart disease Brother   . Stroke Maternal Aunt   . Heart disease Maternal Aunt   .  Hypertension Sister   . Bipolar disorder Sister   . Thyroid disease Sister   . Thyroid disease Daughter   . Thyroid disease Daughter   . Hashimoto's thyroiditis Daughter   . Celiac disease Daughter   . Thyroid disease Daughter     Social History Social History   Tobacco Use  . Smoking status: Former Smoker    Packs/day: 1.00    Years: 17.00    Pack years: 17.00    Types: Cigarettes    Last attempt to quit: 2008    Years since quitting: 11.6  . Smokeless tobacco: Never Used  Substance Use Topics  . Alcohol use: Not Currently  . Drug use: Not Currently    Types: Marijuana     Allergies   Ativan [lorazepam] and  Phenergan [promethazine hcl]   Review of Systems Review of Systems  Constitutional: Negative for activity change.       All ROS Neg except as noted in HPI  HENT: Negative for nosebleeds.   Eyes: Negative for photophobia and discharge.  Respiratory: Negative for cough, shortness of breath and wheezing.   Cardiovascular: Negative for chest pain and palpitations.  Gastrointestinal: Negative for abdominal pain and blood in stool.  Genitourinary: Negative for dysuria, frequency and hematuria.  Musculoskeletal: Negative for arthralgias, back pain and neck pain.  Skin: Negative.   Neurological: Negative for dizziness, seizures and speech difficulty.  Psychiatric/Behavioral: Negative for confusion and hallucinations. The patient is nervous/anxious.      Physical Exam Updated Vital Signs BP (!) 144/89   Pulse 70   Temp 98 F (36.7 C) (Oral)   Resp 20   Ht 5\' 4"  (1.626 m)   Wt 110.2 kg   SpO2 97%   BMI 41.71 kg/m   Physical Exam  Constitutional: She is oriented to person, place, and time. She appears well-developed and well-nourished.  Non-toxic appearance.  HENT:  Head: Normocephalic.  Right Ear: Tympanic membrane and external ear normal.  Left Ear: Tympanic membrane and external ear normal.  Eyes: Pupils are equal, round, and reactive to light. EOM and  lids are normal.  Neck: Normal range of motion. Neck supple. Carotid bruit is not present.  Cardiovascular: Normal rate, regular rhythm, normal heart sounds, intact distal pulses and normal pulses.  Pulmonary/Chest: Breath sounds normal. No stridor. No respiratory distress. She has no wheezes.  Abdominal: Soft. Bowel sounds are normal. There is no tenderness. There is no guarding.  Musculoskeletal: Normal range of motion.  Lymphadenopathy:       Head (right side): No submandibular adenopathy present.       Head (left side): No submandibular adenopathy present.    She has no cervical adenopathy.  Neurological: She is alert and oriented to person, place, and time. She has normal strength. No cranial nerve deficit or sensory deficit.  There is a constant tremor of the left hand.  Skin: Skin is warm and dry.  Psychiatric: She has a normal mood and affect. Her speech is normal.  Nursing note and vitals reviewed.    ED Treatments / Results  Labs (all labs ordered are listed, but only abnormal results are displayed) Labs Reviewed  BASIC METABOLIC PANEL - Abnormal; Notable for the following components:      Result Value   Glucose, Bld 333 (*)    All other components within normal limits  URINALYSIS, ROUTINE W REFLEX MICROSCOPIC - Abnormal; Notable for the following components:   APPearance HAZY (*)    Glucose, UA >=500 (*)    Hgb urine dipstick SMALL (*)    Ketones, ur 5 (*)    Protein, ur 100 (*)    Bacteria, UA RARE (*)    All other components within normal limits  CBC WITH DIFFERENTIAL/PLATELET    EKG None  Radiology No results found.  Procedures Procedures (including critical care time)  Medications Ordered in ED Medications - No data to display   Initial Impression / Assessment and Plan / ED Course  I have reviewed the triage vital signs and the nursing notes.  Pertinent labs & imaging results that were available during my care of the patient were reviewed by me and  considered in my medical decision making (see chart for details).      Final Clinical Impressions(s) / ED Diagnoses MDM  Vital signs reviewed.  Pulse oximetry is 95 to 98%.  Patient became very concerned after checking her blood sugar and finding that it was over 500.  After taking regular insulin.  She is noticing that the blood glucose is coming down.  Here in the emergency department complete blood count was within normal limits. Basic metabolic panel showed a glucose to be elevated at 333.  The anion gap is normal at 9.  Urine analysis shows a hazy yellow specimen with a specific gravity of 1.0-3.  Glucose is greater than 500.  There is 100 mg/daL of protein, otherwise the urine is within normal limits.  At the time of discharge the capillary blood glucose is down to 182.  I have asked the patient to contact Dr. [place] Crane, and to use a sliding scale of the regular insulin only if he advises so.  I also invited the patient to return to the emergency department if any significant changes in her condition, problems, or concerns.  Patient is in agreement with this plan.   Final diagnoses:  Hyperglycemia    ED Discharge Orders    None       Lily Kocher, Hershal Coria 06/06/18 1321    Virgel Manifold, MD 06/07/18 1324

## 2018-06-05 NOTE — Discharge Instructions (Addendum)
Your specific gravity was in the upper limits of normal.  Please increase your fluids.  Please call Dr. [place] Crane on tomorrow and discuss your glucose elevations with him since being on the prednisone.  Please hold your sliding scale for now until you have the discussion with him.  Please return to the emergency department if any changes in your condition, changes in your vision, changes in your concentration, problems, or concerns.

## 2018-06-05 NOTE — ED Triage Notes (Signed)
Pt checked cbg at home before dinner today and was 540.  Pt takes oral and insulin for DM.  Pt states that they took her off the fast acting insulin. Pt admits to taking prednisone at this time.

## 2018-06-05 NOTE — Progress Notes (Signed)
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 Luz is a complicated 57 year old female presenting for follow-up visit.  She does have obstruction on her spirometry today, which does improve with the use of albuterol.  We will treat with a prednisone burst.  This is been her second prednisone burst since I first met her in April 2019.  We had discussed use the use of biologics as a means of sparing her from prednisone bursts, but she would like to hold off on this for now.  She has another prednisone burst and this calendar year, I would certainly work on getting her on 1 of these medications.  Unfortunately, she has had no elevated eosinophils and does not have a high enough IgE to qualify for Xolair.  In addition to her common variable immune deficiency, she has a history of bronchiectasis which is followed by [name].  While she is using a flutter twice a day, I think it might be worthwhile to try using an albuterol nebulizer before using the flutter, as this can often break things up a little more.  She is having tremors at this point, which are felt to be medication related.  However, this does bring up the possibility of CTLA-4 deficiency, which is an immunodeficiency that appears like CVID, but can include autoimmune symptoms as well as infiltration  of lymphocytes into non-lymphoid organs, including the brain. This can also present with lung abnormalities, and it should be noted that Ms. Lindseth does have a history of pulmonary nodules on chest CTs. We will await what Neurology has to say before pursuing this. She also has a history of TIAs in the past.    Plan/Recommendations:   1. Hypogammaglobulinemia - Continue with Cuvitru as you are doing.  - I am glad that everything id going well from an infectious stand point. - We will not make any changes today.   2. Chronic rhinitis   - Continue with nasal saline rinses.  - We can defer on allergy testing at this point.   3. Severe persistent asthma, uncomplicated - Lung function did not look great today, but it did get better with the nebulizer treatment. - We will add on a prednisone burst to control your breathing today: 30 mg (three tablets) twice daily for five days  - Email me if you are not feeling better in 24-48 hours and I can send in an antibiotic ([email or web address]). - We will consider the addition of a biologic in the future if needed.  - Daily controller medication(s): Dulera 200/51mg two puffs twice daily with spacer and Spiriva 1.244m two puffs once daily - Prior to physical activity: ProAir 2 puffs 10-15 minutes before physical activity. - Rescue medications: ProAir 4 puffs every 4-6 hours as needed, albuterol nebulizer one vial every 4-6 hours as needed or DuoNeb nebulizer one vial every 4-6 hours as needed - Asthma control goals:  * Full participation in  all desired activities (may need albuterol before activity) * Albuterol use two time or less a week on average (not counting use with activity) * Cough interfering with sleep two time or less a month * Oral steroids no more than once a year * No hospitalizations  4. Bronchiectasis - I recommended using albuterol I recommended using an albuterol nebulizer prior to using the flutter valve to help break  things up a little bit more. - This could be the start of a bronchiectasis flare, so I did provide my phone number and email address if symptoms are not improving with the prednisone alone. - I did encourage her to get a follow-up appointment with [name].   5. Return in about 3 months (around 09/05/2018).     Subjective:   Niurka Benecke is a 57 y.o. female presenting today for follow up of  Chief Complaint  Patient presents with  . Asthma  . Shortness of Breath  . Cough    Tausha Milhoan has a history of the following: Patient Active Problem List   Diagnosis Date Noted  . Uncontrolled type 2 diabetes mellitus with hyperglycemia ([place]) 03/13/2018  . Hypothyroidism 03/13/2018  . Essential hypertension, benign 03/13/2018  . Mixed hyperlipidemia 03/13/2018  . Hypogammaglobulinemia ([place]) 02/06/2018  . Non-allergic rhinitis 02/06/2018  . Severe persistent asthma, uncomplicated 47/06/6282  . Pulmonary nodules 02/06/2018  . Bronchiectasis without complication ([place]) 66/29/4765  . DM type 2 causing vascular disease ([place]) 02/06/2018    History obtained from: chart review and patient.  Jonelle Sidle Pistilli's Primary Care Provider is [name], MD.     PCP: Dr. Meade Maw Cardiologist: Dr. Kate Sable Endocrinologist: Dr. Loni Beckwith Pulmonologist: Dr. Simonne Maffucci Orthopedic Surgeon: Dr. Almedia Balls  Nola is a 57 y.o. female presenting for a follow up visit. Ms. Goga has a rather complicated past medical history.  She was last seen in May 2019.  All.  She had established with an orthopedic surgeon as well as a pulmonologist.  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  Her asthma was under fairly good control with Dulera and we had discussed doing a biologic.  However, her IgE was 0 and she had no evidence of eosinophilia.  Therefore we were not sure we would be able to get something approved.   Since the last visit, she has mostly done well. She has  not required any prednisone or antibiotics since the last visit. Infusions are going well.    Infectious History: She is doing well with the infusions. She feels very comfortable doing them herself now. She has had no problems with the infusion process. She has not needed any antibiotics since she was seen by [name] in April 2019.   Rhinitis Symptom History: She does report continued congestion today, but she has not been taking Mucinex. She does not use nasal sprays at all, instead using only nasal saline rinses. She has not been tested since establishing her care with [place] since her symptoms have been under good control.   Asthma/Respiratory Symptom History: She has had some problems with the humidity. She reports that she is having increased shortness of breath. She is able to get around well, but she can hear some wheezing. She is having a little cough as well. She likes the [facility] better than the [facility]. She has been nursing her rescue inhaler 1-2 times per day. Shortness of breath has been ongoing for a few weeks. Her last prednisone was  shortly prior to her May visit with me.   She is having continued tremors. This is thought to be due to the combination of Depakote, gabapentin, and Lamictal (but she has been on these medications for years). She has had brain imaging in the last two years, but nothing recently. She has an appointment on September 24th with [facility].   Otherwise, there have been no changes to her past medical history, surgical history, family history, or social history.    Review of Systems: a 14-point review of systems is pertinent for what is mentioned in HPI.  Otherwise, all other systems were negative. Constitutional: negative other than that listed in the HPI Eyes: negative other than that listed in the HPI Ears, nose, mouth, throat, and face: negative other than that listed in the HPI Respiratory: negative other than that listed in the  HPI Cardiovascular: negative other than that listed in the HPI Gastrointestinal: negative other than that listed in the HPI Genitourinary: negative other than that listed in the HPI Integument: negative other than that listed in the HPI Hematologic: negative other than that listed in the HPI Musculoskeletal: negative other than that listed in the HPI Neurological: negative other than that listed in the HPI Allergy/Immunologic: negative other than that listed in the HPI    Objective:   Blood pressure 138/82, pulse 80, resp. rate 19, SpO2 97 %. There is no height or weight on file to calculate BMI.   Physical Exam:  General: Alert, interactive, in no acute distress. Talking in full sentences,but audibly wheezing during the visit. Eyes: No conjunctival injection bilaterally, no discharge on the right, no discharge on the left and no Horner-Trantas dots present. PERRL bilaterally. EOMI without pain. No photophobia.  Ears: Right TM pearly gray with normal light reflex, Left TM pearly gray with normal light reflex, Right TM intact without perforation and Left TM intact without perforation.  Nose/Throat: External nose within normal limits and septum midline. Turbinates edematous and pale with clear discharge. Posterior oropharynx erythematous with cobblestoning in the posterior oropharynx. Tonsils 2+ without exudates.  Tongue without thrush. Lungs: Decreased breath sounds with expiratory wheezing bilaterally. No increased work of breathing. CV: Normal S1/S2. No murmurs. Capillary refill <2 seconds.  Skin: Warm and dry, without lesions or rashes. Neuro:   Grossly intact. No focal deficits appreciated. Responsive to questions.  Diagnostic studies:   Spirometry: results abnormal (FEV1: 1.47/65%, FVC: 2.07/68%, FEV1/FVC: 70%).    Spirometry consistent with possible restrictive disease. Xopenex/Atrovent nebulizer treatment given in clinic with significant improvement in FEV1 per ATS criteria.  FEV increased 210 mL (14%), which is significant per ATS criteria.   Allergy Studies: none     Salvatore Marvel, MD  [facility]

## 2018-06-05 NOTE — Patient Instructions (Addendum)
1. Hypogammaglobulinemia - Continue with Cuvitru as you are doing.  - I am glad that everything id going well from an infectious stand point. - We will not make any changes today.   2. Chronic rhinitis   - Continue with nasal saline rinses.  - We can defer on allergy testing at this point.   3. Severe persistent asthma, uncomplicated - Lung function did not look great today, but it did get better with the nebulizer treatment. - We will add on a prednisone burst to control your breathing today: 30 mg (three tablets) twice daily for five days  - Email me if you are not feeling better in 24-48 hours and I can send in an antibiotic ([email or web address]). - We will consider the addition of a biologic in the future if needed.  - Daily controller medication(s): Dulera 200/40mcg two puffs twice daily with spacer and Spiriva 1.26mcg two puffs once daily - Prior to physical activity: ProAir 2 puffs 10-15 minutes before physical activity. - Rescue medications: ProAir 4 puffs every 4-6 hours as needed, albuterol nebulizer one vial every 4-6 hours as needed or DuoNeb nebulizer one vial every 4-6 hours as needed - Asthma control goals:  * Full participation in all desired activities (may need albuterol before activity) * Albuterol use two time or less a week on average (not counting use with activity) * Cough interfering with sleep two time or less a month * Oral steroids no more than once a year * No hospitalizations  4. Return in about 3 months (around 09/05/2018).   Please inform us of any Emergency Department visits, hospitalizations, or changes in symptoms. Call us before going to the ED for breathing or allergy symptoms since we might be able to fit you in for a sick visit. Feel free to contact us anytime with any questions, problems, or concerns.  It was a pleasure to see you again today!  Here is my work cell if you need me: [phone]  Websites that have reliable patient  information: 1. American Academy of Asthma, Allergy, and Immunology: www.aaaai.org 2. Food Allergy Research and Education (FARE): foodallergy.org 3. Mothers of Asthmatics: http://www.asthmacommunitynetwork.org 4. American College of Allergy, Asthma, and Immunology: MonthlyElectricBill.co.uk   Make sure you are registered to vote! If you have moved or changed any of your contact information, you will need to get this updated before voting!

## 2018-06-06 ENCOUNTER — Telehealth: Payer: Self-pay | Admitting: "Endocrinology

## 2018-06-06 NOTE — Telephone Encounter (Signed)
Pt.notified

## 2018-06-06 NOTE — Telephone Encounter (Signed)
Patient is calling stating that she was in the ER and has been put on Prednisone and her blood sugar is running in the 500"s please advise?

## 2018-06-06 NOTE — Telephone Encounter (Signed)
1) Increase Lantus to 80 units qhs 2) start monitoring glucose 4 times a day- before meals and at bed time 3) call back if >300 x 3.

## 2018-06-07 DIAGNOSIS — M722 Plantar fascial fibromatosis: Secondary | ICD-10-CM | POA: Diagnosis not present

## 2018-06-16 ENCOUNTER — Emergency Department (HOSPITAL_COMMUNITY): Payer: Medicare Other

## 2018-06-16 ENCOUNTER — Other Ambulatory Visit: Payer: Self-pay

## 2018-06-16 ENCOUNTER — Encounter (HOSPITAL_COMMUNITY): Payer: Self-pay | Admitting: Emergency Medicine

## 2018-06-16 ENCOUNTER — Emergency Department (HOSPITAL_COMMUNITY)
Admission: EM | Admit: 2018-06-16 | Discharge: 2018-06-16 | Disposition: A | Discharge status: Home / Self Care | Payer: Medicare Other | Attending: Emergency Medicine | Admitting: Emergency Medicine

## 2018-06-16 DIAGNOSIS — J45909 Unspecified asthma, uncomplicated: Secondary | ICD-10-CM | POA: Diagnosis not present

## 2018-06-16 DIAGNOSIS — Z87891 Personal history of nicotine dependence: Secondary | ICD-10-CM | POA: Diagnosis not present

## 2018-06-16 DIAGNOSIS — E1165 Type 2 diabetes mellitus with hyperglycemia: Secondary | ICD-10-CM | POA: Diagnosis not present

## 2018-06-16 DIAGNOSIS — Z794 Long term (current) use of insulin: Secondary | ICD-10-CM | POA: Insufficient documentation

## 2018-06-16 DIAGNOSIS — Z79899 Other long term (current) drug therapy: Secondary | ICD-10-CM | POA: Insufficient documentation

## 2018-06-16 DIAGNOSIS — Z7901 Long term (current) use of anticoagulants: Secondary | ICD-10-CM | POA: Insufficient documentation

## 2018-06-16 DIAGNOSIS — E039 Hypothyroidism, unspecified: Secondary | ICD-10-CM | POA: Insufficient documentation

## 2018-06-16 DIAGNOSIS — E782 Mixed hyperlipidemia: Secondary | ICD-10-CM | POA: Insufficient documentation

## 2018-06-16 DIAGNOSIS — R739 Hyperglycemia, unspecified: Secondary | ICD-10-CM

## 2018-06-16 DIAGNOSIS — I509 Heart failure, unspecified: Secondary | ICD-10-CM | POA: Diagnosis not present

## 2018-06-16 DIAGNOSIS — R079 Chest pain, unspecified: Secondary | ICD-10-CM | POA: Insufficient documentation

## 2018-06-16 DIAGNOSIS — I110 Hypertensive heart disease with heart failure: Secondary | ICD-10-CM | POA: Insufficient documentation

## 2018-06-16 DIAGNOSIS — R0602 Shortness of breath: Secondary | ICD-10-CM | POA: Diagnosis not present

## 2018-06-16 LAB — I-STAT TROPONIN, ED
TROPONIN I, POC: 0.02 ng/mL (ref 0.00–0.08)
Troponin i, poc: 0.03 ng/mL (ref 0.00–0.08)

## 2018-06-16 LAB — CBG MONITORING, ED
GLUCOSE-CAPILLARY: 517 mg/dL — AB (ref 70–99)
GLUCOSE-CAPILLARY: 361 mg/dL — AB (ref 70–99)
GLUCOSE-CAPILLARY: 273 mg/dL — AB (ref 70–99)

## 2018-06-16 LAB — URINALYSIS, ROUTINE W REFLEX MICROSCOPIC
BACTERIA UA: NONE SEEN
BILIRUBIN URINE: NEGATIVE
Glucose, UA: >=500 mg/dL — AB
Ketones, ur: NEGATIVE mg/dL
Leukocytes, UA: NEGATIVE
Nitrite: NEGATIVE
PROTEIN: NEGATIVE mg/dL
SPECIFIC GRAVITY, URINE: 1.022 (ref 1.005–1.030)
pH: 6 (ref 5.0–8.0)

## 2018-06-16 LAB — BASIC METABOLIC PANEL
Anion gap: 8 (ref 5–15)
BUN: 13 mg/dL (ref 6–20)
CHLORIDE: 103 mmol/L (ref 98–111)
CO2: 25 mmol/L (ref 22–32)
CREATININE: 0.54 mg/dL (ref 0.44–1.00)
Calcium: 8.6 mg/dL — ABNORMAL LOW (ref 8.9–10.3)
GFR calc Af Amer: >60 mL/min (ref >60)
GFR calc non Af Amer: >60 mL/min (ref >60)
GLUCOSE: 553 mg/dL — AB (ref 70–99)
Potassium: 4.3 mmol/L (ref 3.5–5.1)
SODIUM: 136 mmol/L (ref 135–145)

## 2018-06-16 LAB — CBC
HCT: 40.3 % (ref 36.0–46.0)
Hemoglobin: 14.1 g/dL (ref 12.0–15.0)
MCH: 32.9 pg (ref 26.0–34.0)
MCHC: 35 g/dL (ref 30.0–36.0)
MCV: 93.9 fL (ref 78.0–100.0)
PLATELETS: 167 10*3/uL (ref 150–400)
RBC: 4.29 MIL/uL (ref 3.87–5.11)
RDW: 12.8 % (ref 11.5–15.5)
WBC: 4.7 10*3/uL (ref 4.0–10.5)

## 2018-06-16 IMAGING — DX DG CHEST 2V
2 series · 2 of 2 positions shown · non-contrast
Comparison: None.

CLINICAL DATA: Left-sided chest pain and shortness of breath.

EXAM:
CHEST - 2 VIEW

[chest lat]
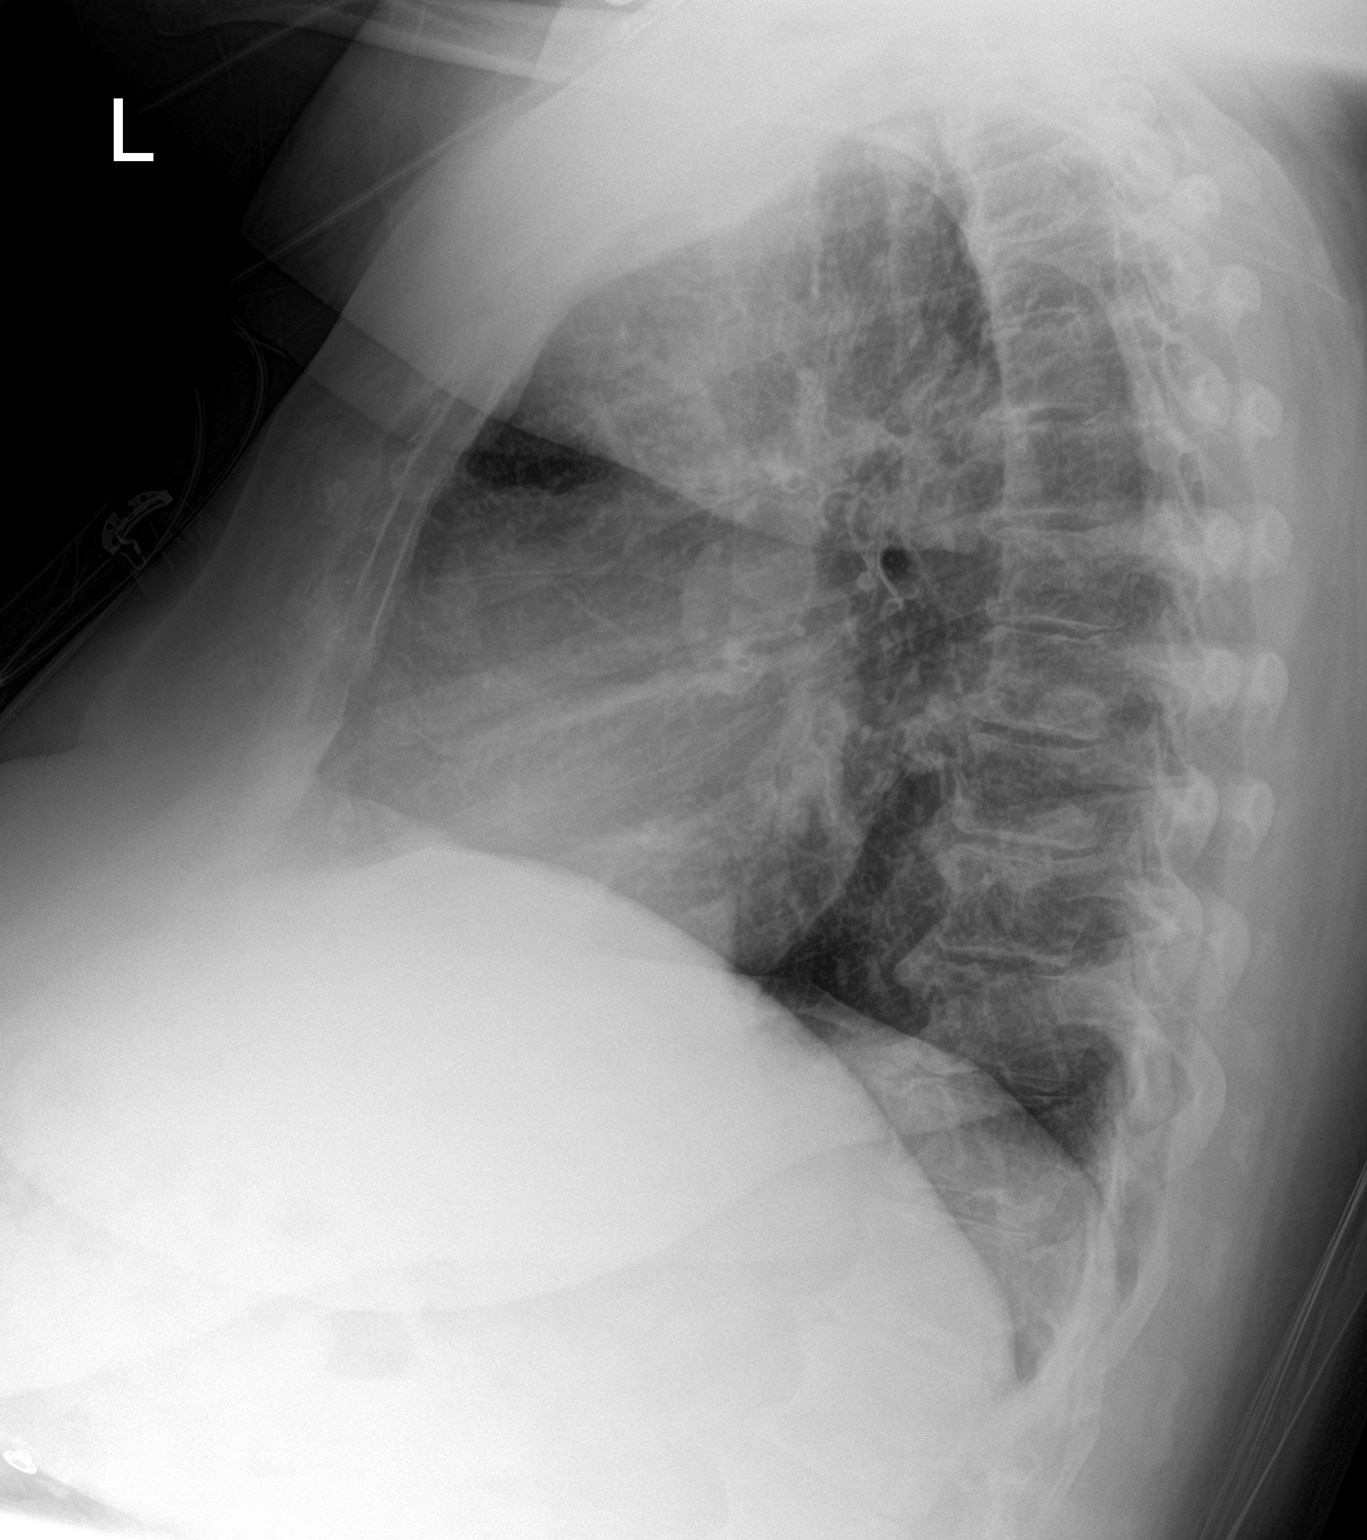

[chest ap]
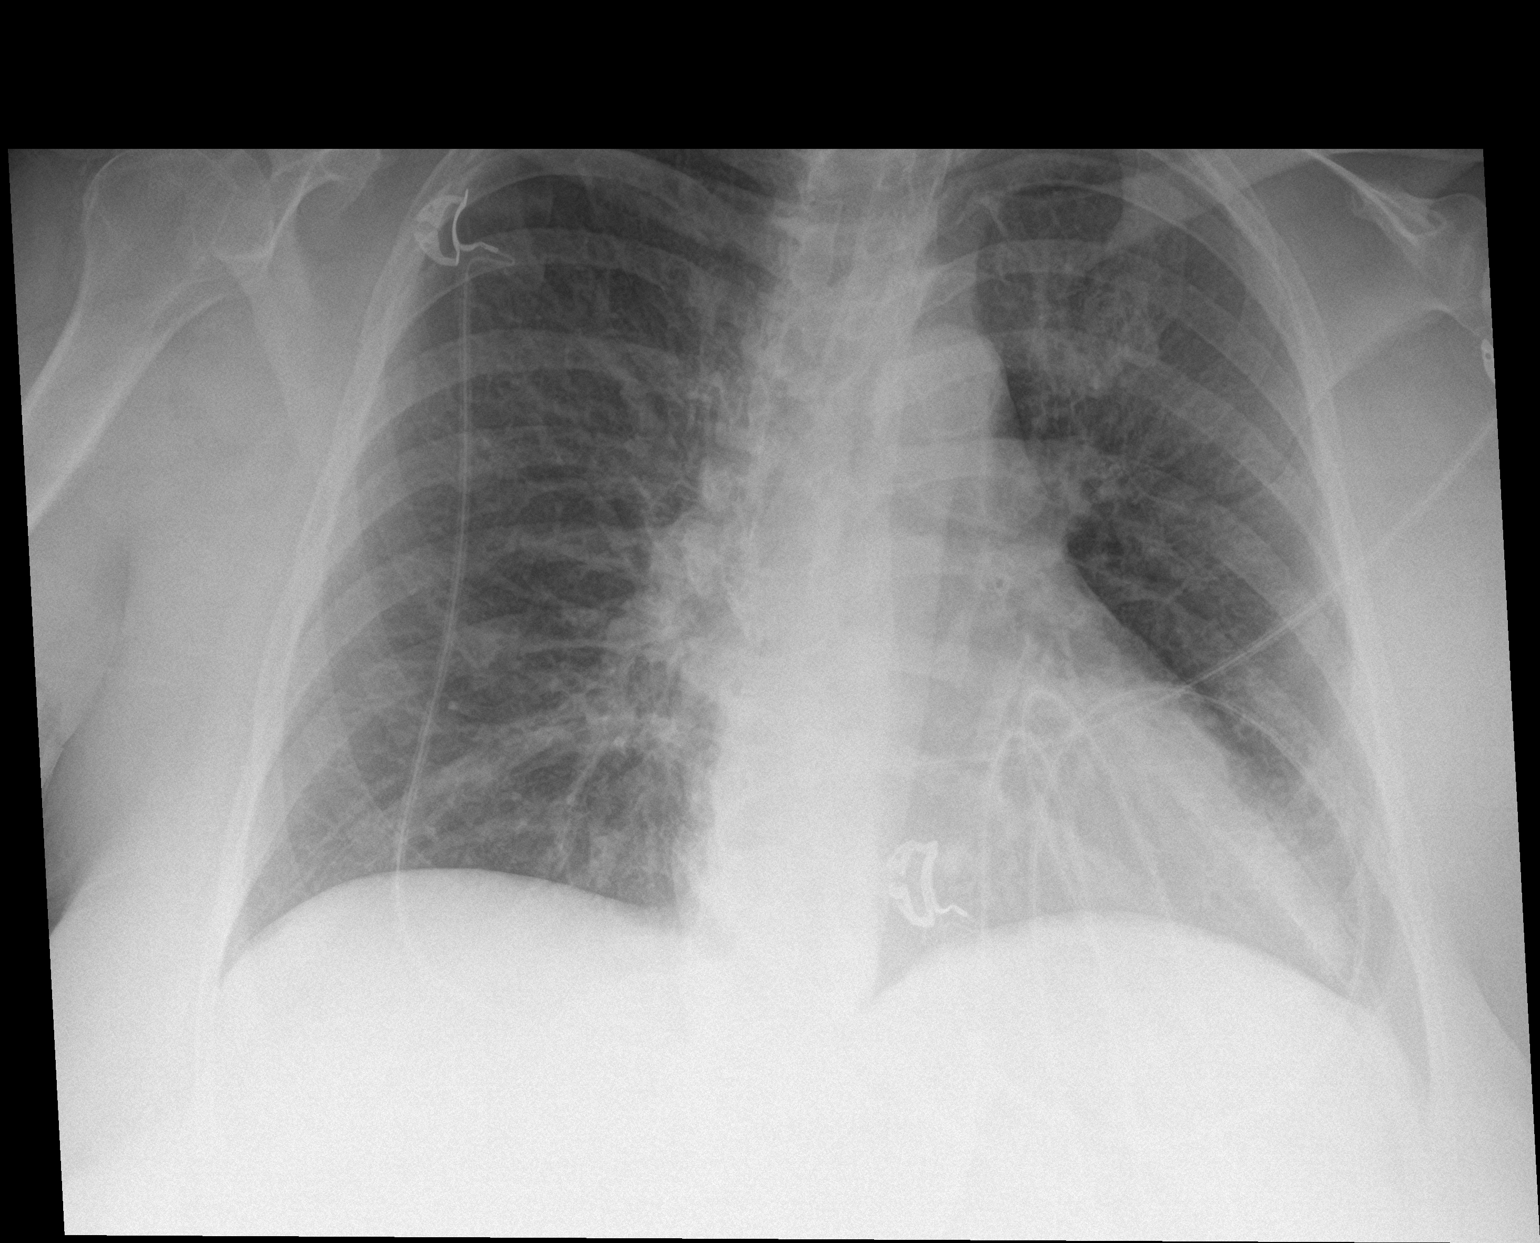

[2 of 2 positions shown; findings below may reference images not displayed]

FINDINGS: No pneumothorax. The heart size borderline. The hila and mediastinum
are unremarkable. No overt edema, nodule, mass, or focal infiltrate.
IMPRESSION: No active cardiopulmonary disease.

## 2018-06-16 MED ORDER — KETOROLAC TROMETHAMINE 30 MG/ML IJ SOLN
30.0000 mg | Freq: Once | INTRAMUSCULAR | Status: AC
  Administered 2018-06-16: 30 mg via INTRAVENOUS
  Filled 2018-06-16: qty 1

## 2018-06-16 MED ORDER — ACETAMINOPHEN 500 MG PO TABS
1000.0000 mg | ORAL_TABLET | Freq: Once | ORAL | Status: AC
  Administered 2018-06-16: 1000 mg via ORAL
  Filled 2018-06-16: qty 2

## 2018-06-16 MED ORDER — SODIUM CHLORIDE 0.9 % IV BOLUS
1000.0000 mL | Freq: Once | INTRAVENOUS | Status: AC
  Administered 2018-06-16: 1000 mL via INTRAVENOUS

## 2018-06-16 MED ORDER — INSULIN ASPART 100 UNIT/ML ~~LOC~~ SOLN
5.0000 [IU] | Freq: Once | SUBCUTANEOUS | Status: AC
  Administered 2018-06-16: 5 [IU] via SUBCUTANEOUS
  Filled 2018-06-16: qty 1

## 2018-06-16 NOTE — ED Notes (Signed)
Pt reports headache 7/10 and left sided chest pain 5/10, no relief from Tylenol

## 2018-06-16 NOTE — ED Notes (Signed)
Date and time results received: 06/16/18 4:55 PM  (use smartphrase ".now" to insert current time)  Test: Glucose Critical Value: 553  Name of Provider Notified: Alvino Chapel  Orders Received? Or Actions Taken?: Orders Received - See Orders for details

## 2018-06-16 NOTE — Discharge Instructions (Addendum)
Your sugar was elevated tonight.  Heart chemicals were normal.  Consider going back to your old regimen of pills and shots.  Follow-up with your endocrinologist next week.

## 2018-06-16 NOTE — ED Triage Notes (Signed)
Patient c/o left side chest pain with shortness of breath, nausea, and dizziness. Patient reports cardiac hx of afib in which she takes Cardizem and eliqous. Patient also states that she had open heart surgery to repair two holes in her heart as a child. Per patient also had high reading for blood sugar today and has increase in tremors.

## 2018-06-17 NOTE — ED Provider Notes (Signed)
[facility] EMERGENCY DEPARTMENT Provider Note   CSN: [id number] Arrival date & time: 06/16/18  1526     History   Chief Complaint Chief Complaint  Patient presents with  . Chest Pain    HPI Laura [name] is a 57 y.o. female.  Patient is primarily concerned about her glucose.  She has known history of diabetes and is been on both oral medication and injections.  She had previously been on a sliding scale for glucose management, but this was discontinued by the local endocrinologist.  She now takes metformin extended release 500 mg 2 tablets in the morning 2 tablets in the evening and questionable Lantus 60 units subcu at night.  She has recently been on prednisone which she blames for her elevated glucose.  Review of systems positive for intermittent chest pain not associated with any activity.  Symptoms are fleeting, without dyspnea, diaphoresis nausea.  She has a long-standing tremor and this has increased lately.  Past medical history includes bipolar disorder, hypothyroidism, CHF, atrial fibrillation others.     Past Medical History:  Diagnosis Date  . Asthma   . Asthma   . Bipolar disorder ([place])   . Bronchiectasis ([place])   . CHF (congestive heart failure) ([place])   . Hypogammaglobulinemia ([place])   . Hypothyroid   . 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 Osteoarthritis   . Recurrent sinusitis   . Type 2 diabetes mellitus ([place])   . Wound infection     Patient Active Problem List   Diagnosis Date Noted  . Uncontrolled type 2 diabetes mellitus with hyperglycemia ([place]) 03/13/2018  . Hypothyroidism 03/13/2018  . Essential hypertension, benign 03/13/2018  . Mixed hyperlipidemia 03/13/2018  . Hypogammaglobulinemia ([place]) 02/06/2018  . Non-allergic rhinitis 02/06/2018  . Severe persistent asthma, uncomplicated 40/34/7425  . Pulmonary nodules 02/06/2018  . Bronchiectasis without complication ([place]) 95/63/8756  . DM type 2 causing vascular disease ([place]) 02/06/2018    Past Surgical  History:  Procedure Laterality Date  . 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 CARDIAC SURGERY    . [place]  . NASAL SINUS SURGERY  2017     OB History   None      Home Medications    Prior to Admission medications   Medication Sig Start Date End Date Taking? Authorizing Provider  Albuterol Sulfate (PROAIR RESPICLICK) 433 (90 Base) MCG/ACT AEPB Inhale 1 puff into the lungs every 6 (six) hours as needed (for shortness of breath/wheezing).    Yes [provider]  ALPRAZolam Duanne Moron) 0.5 MG tablet Take 0.5-1 mg by mouth daily as needed for anxiety.  03/06/18  Yes [provider]  apixaban (ELIQUIS) 5 MG TABS tablet Take 5 mg by mouth 2 (two) times daily.   Yes [provider]  ARIPiprazole (ABILIFY) 10 MG tablet Take 10 mg by mouth every evening.  04/16/18  Yes [provider]  atorvastatin (LIPITOR) 10 MG tablet Take 10 mg by mouth daily.   Yes [provider]  budesonide (RHINOCORT AQUA) 32 MCG/ACT nasal spray Place 2 sprays into both nostrils daily as needed for rhinitis or allergies.    Yes [provider]  cyclobenzaprine (FLEXERIL) 10 MG tablet Take 10 mg by mouth daily as needed for muscle spasms.   Yes [provider]  diltiazem (CARDIZEM CD) 300 MG 24 hr capsule Take 300 mg by mouth daily.   Yes [provider]  divalproex (DEPAKOTE ER) 500 MG 24 hr tablet Take 1,500 mg  by mouth 2 (two) times daily.   Yes [provider]  Dulaglutide (TRULICITY) 1.5 VP/7.1GG SOPN Inject 1.5 mg into the skin every Sunday.    Yes [provider]  furosemide (LASIX) 20 MG tablet Take 20-40 mg by mouth See admin instructions. Take 2 tablets every morning and 1 tablet at noon.    Yes [provider]  gabapentin (NEURONTIN) 600 MG tablet Take 600 mg by mouth 3 (three) times daily.   Yes [provider]  hydrALAZINE (APRESOLINE) 25 MG tablet Take 25 mg by mouth 3 (three) times daily.   Yes  [provider]  hydrochlorothiazide (HYDRODIURIL) 25 MG tablet Take 25 mg by mouth daily.   Yes [provider]  hydrOXYzine (ATARAX/VISTARIL) 25 MG tablet Take 25 mg by mouth 3 (three) times daily as needed for anxiety.    Yes [provider]  Immune Globulin, Human, (CUVITRU) 4 GM/20ML SOLN Inject 100 mLs into the skin every 14 (fourteen) days.   Yes [provider]  insulin glargine (LANTUS) 100 UNIT/ML injection Inject 60 Units into the skin at bedtime.   Yes [provider]  lamoTRIgine (LAMICTAL) 100 MG tablet Take 50 mg by mouth at bedtime.    Yes [provider]  levothyroxine (SYNTHROID, LEVOTHROID) 112 MCG tablet Take 112 mcg by mouth daily before breakfast.   Yes [provider]  lisinopril (PRINIVIL,ZESTRIL) 40 MG tablet Take 40 mg by mouth daily.   Yes [provider]  Melatonin 3 MG TABS Take 3 mg by mouth at bedtime.   Yes [provider]  metFORMIN (GLUCOPHAGE-XR) 500 MG 24 hr tablet Take 1,000 mg by mouth 2 (two) times daily.  04/11/18  Yes [provider]  mometasone-formoterol (DULERA) 200-5 MCG/ACT AERO Inhale 2 puffs into the lungs 2 (two) times daily. 03/01/18  Yes Juanito Doom, MD  tiotropium (SPIRIVA) 18 MCG inhalation capsule Place 18 mcg into inhaler and inhale daily.   Yes [provider]  albuterol (PROVENTIL) (2.5 MG/3ML) 0.083% nebulizer solution Take 2.5 mg by nebulization every 6 (six) hours as needed for wheezing or shortness of breath.    [provider]  ONE TOUCH ULTRA TEST test strip TEST     QID UTD 03/02/18   [provider]  Spacer/Aero-Holding Chambers (AEROCHAMBER MV) inhaler Use as instructed 03/01/18   Juanito Doom, MD    Family History Family History  Problem Relation Age of Onset  . Hypertension Mother   . Stroke Mother   . Kidney disease Mother   . Heart attack Mother   . Kidney disease Sister   . Heart disease Brother     . Stroke Maternal Aunt   . Heart disease Maternal Aunt   . Hypertension Sister   . Bipolar disorder Sister   . Thyroid disease Sister   . Thyroid disease Daughter   . Thyroid disease Daughter   . Hashimoto's thyroiditis Daughter   . Celiac disease Daughter   . Thyroid disease Daughter     Social History Social History   Tobacco Use  . Smoking status: Former Smoker    Packs/day: 1.00    Years: 17.00    Pack years: 17.00    Types: Cigarettes    Last attempt to quit: 2008    Years since quitting: 11.6  . Smokeless tobacco: Never Used  Substance Use Topics  . Alcohol use: Not Currently  . Drug use: Not Currently     Allergies   Ativan [  lorazepam] and Phenergan [promethazine hcl]   Review of Systems Review of Systems  All other systems reviewed and are negative.    Physical Exam Updated Vital Signs BP (!) 182/69 (BP Location: Right Arm)   Pulse 67   Resp 14   Ht 5\' 4"  (1.626 m)   Wt 110.2 kg   SpO2 99%   BMI 41.71 kg/m   Physical Exam  Constitutional: She is oriented to person, place, and time. She appears well-developed and well-nourished.  Tremulous, no acute distress  HENT:  Head: Normocephalic and atraumatic.  Eyes: Conjunctivae are normal.  Neck: Neck supple.  Cardiovascular: Normal rate and regular rhythm.  Pulmonary/Chest: Effort normal and breath sounds normal.  Abdominal: Soft. Bowel sounds are normal.  Musculoskeletal: Normal range of motion.  Neurological: She is alert and oriented to person, place, and time.  Skin: Skin is warm and dry.  Psychiatric: She has a normal mood and affect. Her behavior is normal.  Nursing note and vitals reviewed.    ED Treatments / Results  Labs (all labs ordered are listed, but only abnormal results are displayed) Labs Reviewed  BASIC METABOLIC PANEL - Abnormal; Notable for the following components:      Result Value   Glucose, Bld 553 (*)    Calcium 8.6 (*)    All other components within normal limits   URINALYSIS, ROUTINE W REFLEX MICROSCOPIC - Abnormal; Notable for the following components:   Color, Urine STRAW (*)    Glucose, UA >=500 (*)    Hgb urine dipstick SMALL (*)    All other components within normal limits  CBG MONITORING, ED - Abnormal; Notable for the following components:   Glucose-Capillary 517 (*)    All other components within normal limits  CBG MONITORING, ED - Abnormal; Notable for the following components:   Glucose-Capillary 361 (*)    All other components within normal limits  CBG MONITORING, ED - Abnormal; Notable for the following components:   Glucose-Capillary 273 (*)    All other components within normal limits  CBC  I-STAT TROPONIN, ED  I-STAT TROPONIN, ED    EKG EKG Interpretation  Date/Time:  Saturday June 16 2018 15:30:47 EDT Ventricular Rate:  91 PR Interval:    QRS Duration: 100 QT Interval:  388 QTC Calculation: 477 R Axis:   44 Text Interpretation:  Sinus rhythm with 1st degree A-V block Otherwise normal ECG Confirmed by Nat Christen [phone]) on 06/16/2018 5:54:44 PM   Radiology Dg Chest 2 View  Result Date: 06/16/2018 CLINICAL DATA:  Left-sided chest pain and shortness of breath. EXAM: CHEST - 2 VIEW COMPARISON:  None. FINDINGS: No pneumothorax. The heart size borderline. The hila and mediastinum are unremarkable. No overt edema, nodule, mass, or focal infiltrate. IMPRESSION: No active cardiopulmonary disease. Electronically Signed   By: Dorise Bullion III M.D   On: 06/16/2018 16:59    Procedures Procedures (including critical care time)  Medications Ordered in ED Medications  sodium chloride 0.9 % bolus 1,000 mL (0 mLs Intravenous Stopped 06/16/18 1828)  insulin aspart (novoLOG) injection 5 Units (5 Units Subcutaneous Given 06/16/18 1718)  acetaminophen (TYLENOL) tablet 1,000 mg (1,000 mg Oral Given 06/16/18 1717)  ketorolac (TORADOL) 30 MG/ML injection 30 mg (30 mg Intravenous Given 06/16/18 1955)     Initial Impression / Assessment  and Plan / ED Course  I have reviewed the triage vital signs and the nursing notes.  Pertinent labs & imaging results that were available during my care of the  patient were reviewed by me and considered in my medical decision making (see chart for details).     Patient presents with a concern about glucose management and chest pain.  EKG shows no acute changes.  Delta troponin negative.  Initial glucose 517.  Repeat after subq insulin injection 273.  Patient is stable for outpatient management.  We discussed various options for her glucose control.  She will work with a glucose log and her endocrinologist for further recommendations  Final Clinical Impressions(s) / ED Diagnoses   Final diagnoses:  Chest pain, unspecified type  Hyperglycemia    ED Discharge Orders    None       Nat Christen, MD 06/17/18 1514

## 2018-06-19 DIAGNOSIS — M722 Plantar fascial fibromatosis: Secondary | ICD-10-CM | POA: Diagnosis not present

## 2018-06-25 DIAGNOSIS — F419 Anxiety disorder, unspecified: Secondary | ICD-10-CM | POA: Diagnosis not present

## 2018-06-25 DIAGNOSIS — G259 Extrapyramidal and movement disorder, unspecified: Secondary | ICD-10-CM | POA: Diagnosis not present

## 2018-06-25 DIAGNOSIS — F319 Bipolar disorder, unspecified: Secondary | ICD-10-CM | POA: Diagnosis not present

## 2018-06-25 DIAGNOSIS — E1140 Type 2 diabetes mellitus with diabetic neuropathy, unspecified: Secondary | ICD-10-CM | POA: Diagnosis not present

## 2018-06-25 DIAGNOSIS — Z6841 Body Mass Index (BMI) 40.0 and over, adult: Secondary | ICD-10-CM | POA: Diagnosis not present

## 2018-06-25 DIAGNOSIS — E039 Hypothyroidism, unspecified: Secondary | ICD-10-CM | POA: Diagnosis not present

## 2018-06-28 ENCOUNTER — Ambulatory Visit: Payer: Medicare Other | Admitting: "Endocrinology

## 2018-07-03 ENCOUNTER — Telehealth: Payer: Self-pay | Admitting: Neurology

## 2018-07-03 ENCOUNTER — Encounter: Payer: Self-pay | Admitting: Neurology

## 2018-07-03 ENCOUNTER — Ambulatory Visit (INDEPENDENT_AMBULATORY_CARE_PROVIDER_SITE_OTHER): Payer: Medicare Other | Admitting: Neurology

## 2018-07-03 VITALS — BP 136/94 | HR 117 | Ht 64.0 in | Wt 242.0 lb

## 2018-07-03 DIAGNOSIS — E0842 Diabetes mellitus due to underlying condition with diabetic polyneuropathy: Secondary | ICD-10-CM | POA: Diagnosis not present

## 2018-07-03 DIAGNOSIS — R251 Tremor, unspecified: Secondary | ICD-10-CM | POA: Diagnosis not present

## 2018-07-03 DIAGNOSIS — G2119 Other drug induced secondary parkinsonism: Secondary | ICD-10-CM | POA: Diagnosis not present

## 2018-07-03 NOTE — Patient Instructions (Signed)
Your tremor is likely due to medication, including the depakote primarily.  Your mild parkinsonian symptoms and fine motor skill issues are likely due to the Abilify.  I would favor you reduce your depakote and if possible also your Abilify. As discussed, we will do a brain scan, called MRI and call you with the test results. We will have to schedule you for this on a separate date. This test requires authorization from your insurance, and we will take care of the insurance process. For diabetic neuropathy, you are already on high-dose gabapentin. You previously had electrophysiological testing with EMG and nerve conduction velocity testing before. Optimal diabetes control is key for prevention of further neuropathy.  I will see you back for a recheck in 6 months.

## 2018-07-03 NOTE — Telephone Encounter (Signed)
Medicare/medicaid order sent to GI. They will reach out to the pt to schedule.  °

## 2018-07-03 NOTE — Progress Notes (Signed)
Subjective:    Patient ID: Laura [name] is a 57 y.o. female.  HPI     History:  Dear Dr. Nevada Crane,  I saw your patient, Fawn Desrocher, upon your kind request in my neurologic clinic today for initial consultation of her dizziness and hand tremors. The patient is unaccompanied today. As you know, Ms. Mckellips is a 57 year old right-handed woman with an underlying complex medical history of paroxysmal A. fib, congestive heart failure, followed by cardiology, type 2 diabetes with associated neuropathy, bronchiectasis and history of asthma, followed by pulmonology, recurrent sinusitis, osteoarthritis, IgE deficiency, bipolar disease/mood d/o, sleep apnea on CPAP therapy, and morbid obesity with a BMI of over 40, who reports a long-standing history of hand tremors. She has noticed worsening of her hand tremors on higher doses of Lamictal. She was up to 300 mg daily at one point but for the past 2 years or so has been on 50 mg daily.  She reports a history of TIA in the past with transient left-sided weakness. She had a brain MRI some years ago, results are not available for my review today. [place].  Of note, she is on multiple potentially sedating medications including Lamictal 50 mg daily, cyclobenzaprine 10 mg as needed, Depakote 1500 mg twice daily, gabapentin 600 mg 3 times a day and alprazolam 0.5 mg up to 2 pills daily. Of note, she presented to the emergency room earlier in September 2019 with chest pain, blood sugar level was in the 500s at the time. She states, her BS went up to 600s even. She is trying to eat well. A1c in June was 6.9.  She had EMG an NCV in the past.  She moved from [place] some 5 months ago, in April, has not seen a psychiatrist here yet, but saw a counselor and psychiatrist in [place] for years, since the 28s. She reports having a Dx of Depression, anxiety and PTSD, prior Hx of personality d/o, per patient report. She has sleep apnea, sees [facility] in  pulmonology. Uses a CPAP. She has recently reduced the VPA to 3 in AM and 2 at night. Abilify was started about 6 months ago.   She has no family history of tremors or Parkinson's disease but does not know much about her father's medical history or father's side of the medical history. As far as dizziness, she has noted lightheadedness especially when she stands up quickly or gets out of the shower. She has to hold on at times, thankfully no recent falls. Her husband passed away about 5 years ago, she has 3 children. She works at [employer], lives with her sister. She quit smoking in 2002, does not utilize alcohol and limits her coffee to 2 cups per day on average.  Her Past Medical History Is Significant For: Past Medical History:  Diagnosis Date  . Asthma   . Asthma   . Bipolar disorder ([place])   . Bronchiectasis ([place])   . CHF (congestive heart failure) ([place])   . Hypogammaglobulinemia ([place])   . Hypothyroid   . IgE deficiency ([place])   . Osteoarthritis   . Recurrent sinusitis   . Type 2 diabetes mellitus ([place])   . Wound infection     Her Past Surgical History Is Significant For: Past Surgical History:  Procedure Laterality Date  . 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 CARDIAC SURGERY    . [place]  . NASAL SINUS SURGERY  2017    Her Family  History Is Significant For: Family History  Problem Relation Age of Onset  . Hypertension Mother   . Stroke Mother   . Kidney disease Mother   . Heart attack Mother   . Kidney disease Sister   . Heart disease Brother   . Stroke Maternal Aunt   . Heart disease Maternal Aunt   . Hypertension Sister   . Bipolar disorder Sister   . Thyroid disease Sister   . Thyroid disease Daughter   . Thyroid disease Daughter   . Hashimoto's thyroiditis Daughter   . Celiac disease Daughter   . Thyroid disease Daughter     Her Social History Is Significant For: Social History   Socioeconomic History  . Marital status: Widowed    Spouse  name: Not on file  . Number of children: Not on file  . Years of education: Not on file  . Highest education level: Not on file  Occupational History  . Not on file  Social Needs  . Financial resource strain: Not on file  . Food insecurity:    Worry: Not on file    Inability: Not on file  . Transportation needs:    Medical: Not on file    Non-medical: Not on file  Tobacco Use  . Smoking status: Former Smoker    Packs/day: 1.00    Years: 17.00    Pack years: 17.00    Types: Cigarettes    Last attempt to quit: 2008    Years since quitting: 11.7  . Smokeless tobacco: Never Used  Substance and Sexual Activity  . Alcohol use: Not Currently  . Drug use: Not Currently  . Sexual activity: Not on file  Lifestyle  . Physical activity:    Days per week: Not on file    Minutes per session: Not on file  . Stress: Not on file  Relationships  . Social connections:    Talks on phone: Not on file    Gets together: Not on file    Attends religious service: Not on file    Active member of club or organization: Not on file    Attends meetings of clubs or organizations: Not on file    Relationship status: Not on file  Other Topics Concern  . Not on file  Social History Narrative  . Not on file    Her Allergies Are:  Allergies  Allergen Reactions  . Ativan [Lorazepam] Hives  . Phenergan [Promethazine Hcl] Hives  :   Her Current Medications Are:  Outpatient Encounter Medications as of 07/03/2018  Medication Sig  . albuterol (PROVENTIL) (2.5 MG/3ML) 0.083% nebulizer solution Take 2.5 mg by nebulization every 6 (six) hours as needed for wheezing or shortness of breath.  . Albuterol Sulfate (PROAIR RESPICLICK) 616 (90 Base) MCG/ACT AEPB Inhale 1 puff into the lungs every 6 (six) hours as needed (for shortness of breath/wheezing).   . ALPRAZolam (XANAX) 0.5 MG tablet Take 0.5-1 mg by mouth daily as needed for anxiety.   Marland Kitchen apixaban (ELIQUIS) 5 MG TABS tablet Take 5 mg by mouth 2 (two)  times daily.  . ARIPiprazole (ABILIFY) 10 MG tablet Take 10 mg by mouth every evening.   Marland Kitchen atorvastatin (LIPITOR) 10 MG tablet Take 10 mg by mouth daily.  . budesonide (RHINOCORT AQUA) 32 MCG/ACT nasal spray Place 2 sprays into both nostrils daily as needed for rhinitis or allergies.   . cyclobenzaprine (FLEXERIL) 10 MG tablet Take 10 mg by mouth daily as needed for muscle spasms.  Marland Kitchen  diltiazem (CARDIZEM CD) 300 MG 24 hr capsule Take 300 mg by mouth daily.  . divalproex (DEPAKOTE ER) 500 MG 24 hr tablet Take 1,500 mg by mouth 2 (two) times daily.  . Dulaglutide (TRULICITY) 1.5 PP/5.0DT SOPN Inject 1.5 mg into the skin every Sunday.   . furosemide (LASIX) 20 MG tablet Take 20-40 mg by mouth See admin instructions. Take 2 tablets every morning and 1 tablet at noon.   . gabapentin (NEURONTIN) 600 MG tablet Take 600 mg by mouth 3 (three) times daily.  . hydrALAZINE (APRESOLINE) 25 MG tablet Take 25 mg by mouth 3 (three) times daily.  . hydrochlorothiazide (HYDRODIURIL) 25 MG tablet Take 25 mg by mouth daily.  . hydrOXYzine (ATARAX/VISTARIL) 25 MG tablet Take 25 mg by mouth 3 (three) times daily as needed for anxiety.   . Immune Globulin, Human, (CUVITRU) 4 GM/20ML SOLN Inject 100 mLs into the skin every 14 (fourteen) days.  . insulin glargine (LANTUS) 100 UNIT/ML injection Inject 60 Units into the skin at bedtime.  . lamoTRIgine (LAMICTAL) 100 MG tablet Take 50 mg by mouth at bedtime.   Marland Kitchen levothyroxine (SYNTHROID, LEVOTHROID) 112 MCG tablet Take 112 mcg by mouth daily before breakfast.  . lisinopril (PRINIVIL,ZESTRIL) 40 MG tablet Take 40 mg by mouth daily.  . Melatonin 3 MG TABS Take 3 mg by mouth at bedtime.  . metFORMIN (GLUCOPHAGE-XR) 500 MG 24 hr tablet Take 1,000 mg by mouth 2 (two) times daily.   . mometasone-formoterol (DULERA) 200-5 MCG/ACT AERO Inhale 2 puffs into the lungs 2 (two) times daily.  . ONE TOUCH ULTRA TEST test strip TEST     QID UTD  . Spacer/Aero-Holding Chambers (AEROCHAMBER  MV) inhaler Use as instructed  . tiotropium (SPIRIVA) 18 MCG inhalation capsule Place 18 mcg into inhaler and inhale daily.   No facility-administered encounter medications on file as of 07/03/2018.   :  Review of Systems:  Out of a complete 14 point review of systems, all are reviewed and negative with the exception of these symptoms as listed below:  Review of Systems  Neurological:       Pt presents today to discuss her dizziness, tremor, and neuropathy. Pt has tremors in both of her hands, though the left hand seems to be worse. Pt needs a local neurologist. Pt is right handed. Pt sees Dr. Pennie Banter as a pulmonologist.    Objective:  Neurological Exam  Physical Exam Physical Examination:   Vitals:   07/03/18 0958  BP: (!) 136/94  Pulse: (!) 117   Orthostatic testing: lying blood pressure and pulse were 156/84 with a pulse of 104, sitting 136/94 with a pulse of 114, standing 163/96 with a pulse of 114. She did not have any significant lightheadedness upon testing and no vertiginous symptoms.  General Examination: The patient is a very pleasant 57 y.o. female in no acute distress. She appears well-developed and well-nourished and well groomed.   HEENT: Normocephalic, atraumatic, pupils are equal, round and reactive to light and accommodation. Corrective eyeglasses in place. Extraocular tracking is fairly well-preserved. She may have minimal facial masking. She has no significant nuchal rigidity. Airway examination shows a mild to moderately crowded airway, mild to moderate mouth dryness, normal tongue movements. She has an intermittent lower lip and jaw tremor. She has no voice tremor.  Chest: Mild wheezing throughout. No crackles noted.  Heart: S1+S2+0, regular and normal without murmurs, rubs or gallops noted.   Abdomen: Soft, non-tender and non-distended with normal bowel sounds appreciated  on auscultation.  Extremities: There is no pitting edema in the distal lower extremities  bilaterally. Pedal pulses are intact.  Skin: Warm and dry without trophic changes noted.  Musculoskeletal: exam reveals no obvious joint deformities, tenderness or joint swelling or erythema.   Neurologically:  Mental status: The patient is awake, alert and oriented in all 4 spheres. Her immediate and remote memory, attention, language skills and fund of knowledge are appropriate. There is no evidence of aphasia, agnosia, apraxia or anomia. Speech is clear with normal prosody and enunciation. Thought process is linear. Mood is normal and affect is normal.  Cranial nerves II - XII are as described above under HEENT exam. In addition: shoulder shrug is normal with equal shoulder height noted. Motor exam: Normal bulk, strength and tone is noted. She has an intermittent resting tremor in the left more than right upper extremity, no lower extremity resting tremor. She has a mild postural tremor in both upper extremities, minimal tremor with action.   On 07/03/2018: On Archimedes spiral drawing she has a mild tremor with the left, she has no significant difficulty with her right hand which is her dominant hand, handwriting is legible, not particularly tremulous or micrographic. On fine motor testing she hasmild difficulty globally with fine motor skills including finger taps, hand movements and rapid alternating patting, some lateralization to the left noted. Reflexes are 1+ in the upper extremities, trace in the knees and ankles. On sensory testing she has sensitivity to pinprick in the distal lower extremities and mild decrease to temperature sense in the distal lower extremities, otherwise fairly preserved sensation.  Cerebellar testing: No dysmetria or intention tremor. There is no truncal or gait ataxia.  Gait, station and balance: She stands without difficulty, is able to stand narrow based, walks cautiously, slight decrease in arm swing noted bilaterally, tremor noted left arm more than right with  walking.  Assessment and Plan:    In summary, Hilary Milks is a very pleasant 57 y.o.-year old female with an underlying complex medical history of paroxysmal A. fib, congestive heart failure, followed by cardiology, type 2 diabetes with associated neuropathy, bronchiectasis and history of asthma, followed by pulmonology, recurrent sinusitis, osteoarthritis, IgE deficiency, bipolar disease/mood d/o, sleep apnea on CPAP therapy, and morbid obesity with a BMI of over 40, who presents for evaluation of her tremors and dizziness. on examination, she has a bilateral upper extremity postural and action tremor, she also has fine motor dysfunction and no resting tremor and some lateralization of her fine motor dyscontrol on the left. 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 She has also a risk for tremors secondary to other medications including higher dose Depakote. I talked with the patient at length about her issues and the complicating situation of taking multiple medications that can cause tremor and even parkinsonism. She has recently reduced her Depakote by 1 pill at night, nevertheless, I would recommend that she further tried to reduce her Depakote and also try to reduce the Abilify if possible. She has not yet established care with a psychiatrist since her move from [place]. 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 I would like to proceed with a brain MRI to rule out a structural cause of her tremors. As far as the dizziness, she is advised that she had a drop in her blood pressure from  changing positions from lying to sitting. Nevertheless, her dizziness could be nonspecific, in part also a medication effect as medications like gabapentin can impair balance and cause nonspecific symptoms of dizziness. For her diabetic neuropathy, she had  EMG and nerve conduction testing before. Optimalization of diabetes control is going to PT for further prevention and she understands this. She is already on high-dose gabapentin and I would not recommend an increase. She is advised to stay well-hydrated and changes in position should be done slowly. She has been cautious. I suggested reevaluation in about 6 months, we will call her in the interim with her MRI results.   I answered all her questions today and the patient was in agreement with the above outlined plan. Thank you very much for allowing me to participate in the care of this nice patient. If I can be of any further assistance to you please do not hesitate to call me at [phone].  Sincerely,   Star Age, MD, PhD

## 2018-07-10 ENCOUNTER — Emergency Department (HOSPITAL_COMMUNITY)
Admission: EM | Admit: 2018-07-10 | Discharge: 2018-07-10 | Disposition: A | Discharge status: Home / Self Care | Payer: Medicare Other | Attending: Emergency Medicine | Admitting: Emergency Medicine

## 2018-07-10 ENCOUNTER — Other Ambulatory Visit: Payer: Self-pay

## 2018-07-10 ENCOUNTER — Encounter (HOSPITAL_COMMUNITY): Payer: Self-pay | Admitting: Emergency Medicine

## 2018-07-10 ENCOUNTER — Emergency Department (HOSPITAL_COMMUNITY): Payer: Medicare Other

## 2018-07-10 DIAGNOSIS — Z87891 Personal history of nicotine dependence: Secondary | ICD-10-CM | POA: Insufficient documentation

## 2018-07-10 DIAGNOSIS — R51 Headache: Secondary | ICD-10-CM | POA: Diagnosis not present

## 2018-07-10 DIAGNOSIS — Z79899 Other long term (current) drug therapy: Secondary | ICD-10-CM | POA: Insufficient documentation

## 2018-07-10 DIAGNOSIS — I110 Hypertensive heart disease with heart failure: Secondary | ICD-10-CM | POA: Insufficient documentation

## 2018-07-10 DIAGNOSIS — Z794 Long term (current) use of insulin: Secondary | ICD-10-CM | POA: Insufficient documentation

## 2018-07-10 DIAGNOSIS — I509 Heart failure, unspecified: Secondary | ICD-10-CM | POA: Insufficient documentation

## 2018-07-10 DIAGNOSIS — E119 Type 2 diabetes mellitus without complications: Secondary | ICD-10-CM | POA: Diagnosis not present

## 2018-07-10 DIAGNOSIS — J45909 Unspecified asthma, uncomplicated: Secondary | ICD-10-CM | POA: Insufficient documentation

## 2018-07-10 DIAGNOSIS — R519 Headache, unspecified: Secondary | ICD-10-CM

## 2018-07-10 DIAGNOSIS — E039 Hypothyroidism, unspecified: Secondary | ICD-10-CM | POA: Insufficient documentation

## 2018-07-10 HISTORY — DX: Post-traumatic stress disorder, unspecified: F43.10

## 2018-07-10 LAB — CBC WITH DIFFERENTIAL/PLATELET
Basophils Absolute: 0 10*3/uL (ref 0.0–0.1)
Basophils Relative: 0 %
Eosinophils Absolute: 0.1 10*3/uL (ref 0.0–0.7)
Eosinophils Relative: 1 %
HCT: 40.3 % (ref 36.0–46.0)
Hemoglobin: 14 g/dL (ref 12.0–15.0)
Lymphocytes Relative: 46 %
Lymphs Abs: 2.7 10*3/uL (ref 0.7–4.0)
MCH: 33.3 pg (ref 26.0–34.0)
MCHC: 34.7 g/dL (ref 30.0–36.0)
MCV: 95.7 fL (ref 78.0–100.0)
Monocytes Absolute: 0.4 10*3/uL (ref 0.1–1.0)
Monocytes Relative: 7 %
Neutro Abs: 2.7 10*3/uL (ref 1.7–7.7)
Neutrophils Relative %: 46 %
Platelets: 203 10*3/uL (ref 150–400)
RBC: 4.21 MIL/uL (ref 3.87–5.11)
RDW: 12.8 % (ref 11.5–15.5)
WBC: 6 10*3/uL (ref 4.0–10.5)

## 2018-07-10 LAB — BASIC METABOLIC PANEL
Anion gap: 14 (ref 5–15)
BUN: 23 mg/dL — ABNORMAL HIGH (ref 6–20)
CO2: 26 mmol/L (ref 22–32)
Calcium: 8.7 mg/dL — ABNORMAL LOW (ref 8.9–10.3)
Chloride: 99 mmol/L (ref 98–111)
Creatinine, Ser: 0.88 mg/dL (ref 0.44–1.00)
GFR calc Af Amer: >60 mL/min (ref >60)
GFR calc non Af Amer: >60 mL/min (ref >60)
Glucose, Bld: 98 mg/dL (ref 70–99)
Potassium: 4.2 mmol/L (ref 3.5–5.1)
Sodium: 139 mmol/L (ref 135–145)

## 2018-07-10 IMAGING — CT CT HEAD W/O CM
3 series · 16 of 47 positions shown, 19 images · non-contrast
Comparison: None.

CLINICAL DATA: Severe headache

EXAM:
CT HEAD WITHOUT CONTRAST
TECHNIQUE: Contiguous axial images were obtained from the base of the skull
through the vertex without intravenous contrast.

[Series 2: head wo · axial · 0.42mm/px · z∈[+1384,+1509]mm · 10 of 31 slices shown, 13 images]
[im 3/31  brain]
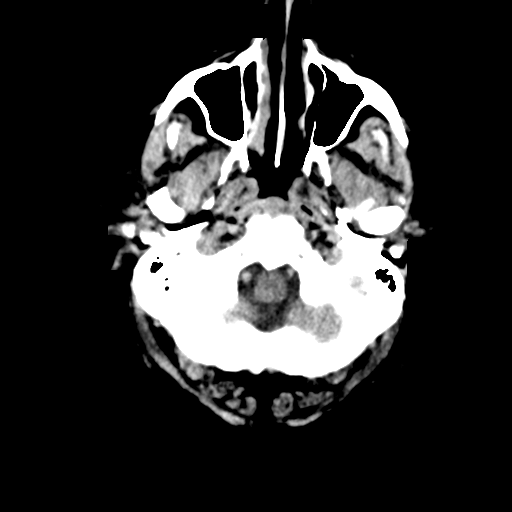
[im 3/31  bone]
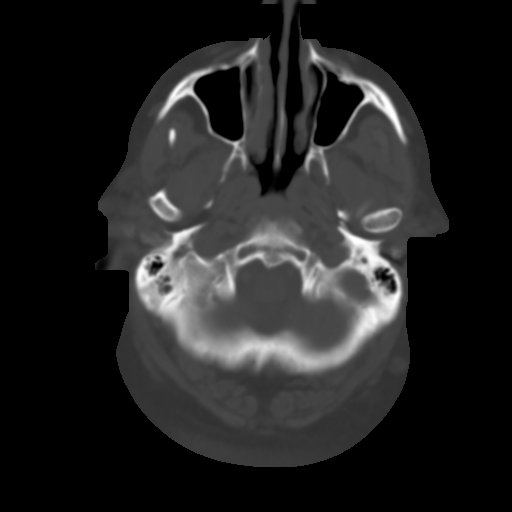
[im 6/31  brain]
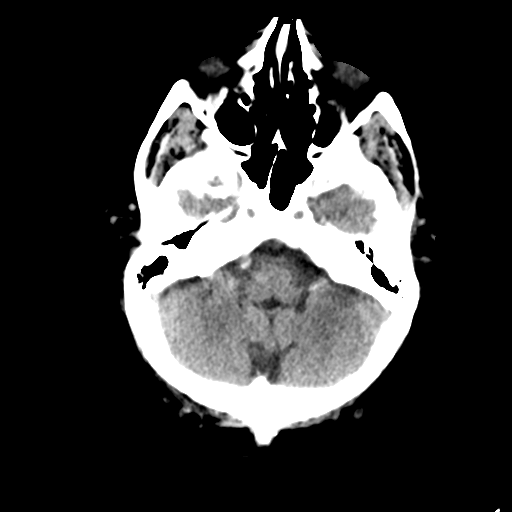
[im 9/31  brain]
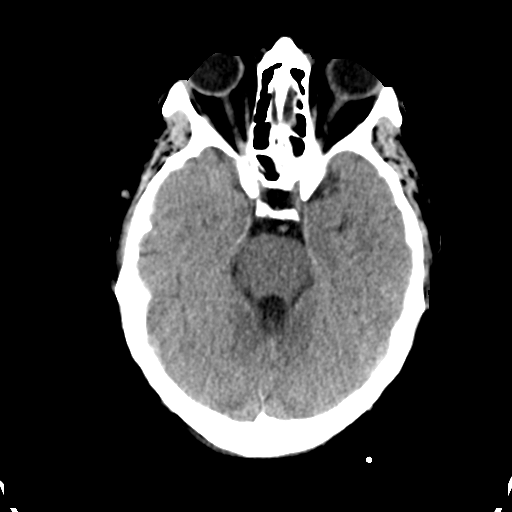
[im 11/31  brain]
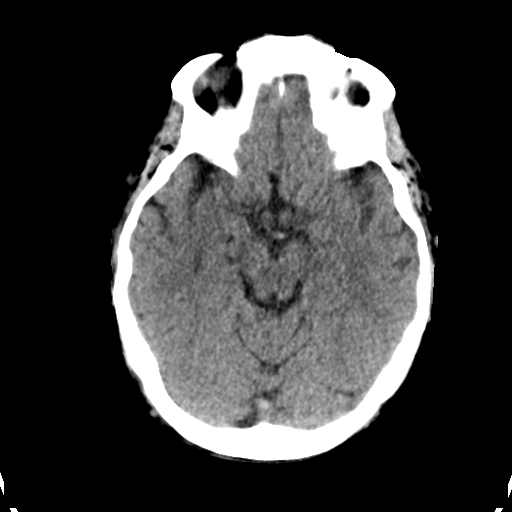
[im 14/31  brain]
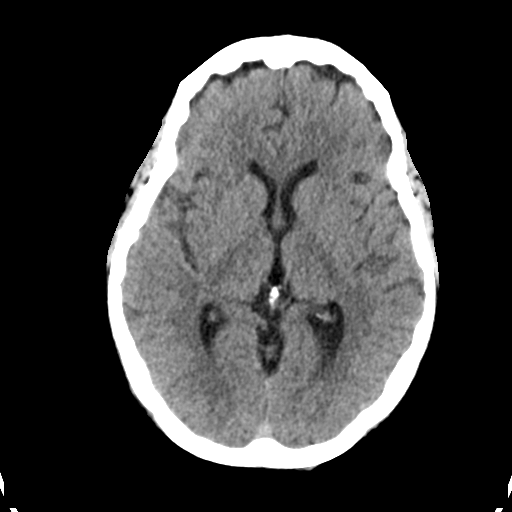
[im 14/31  bone]
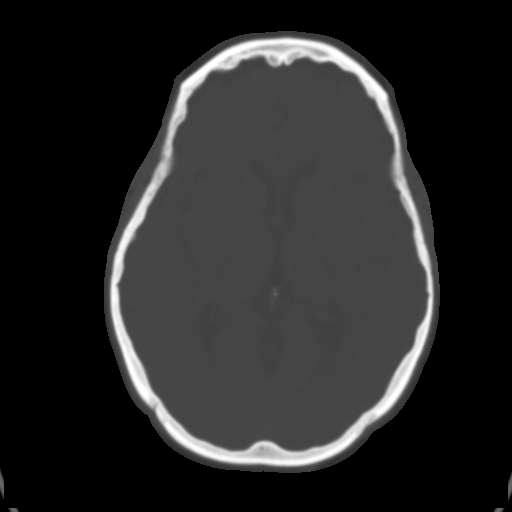
[im 17/31  brain]
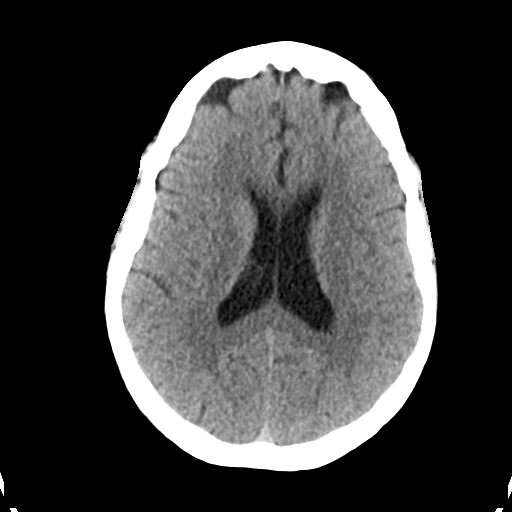
[im 20/31  brain]
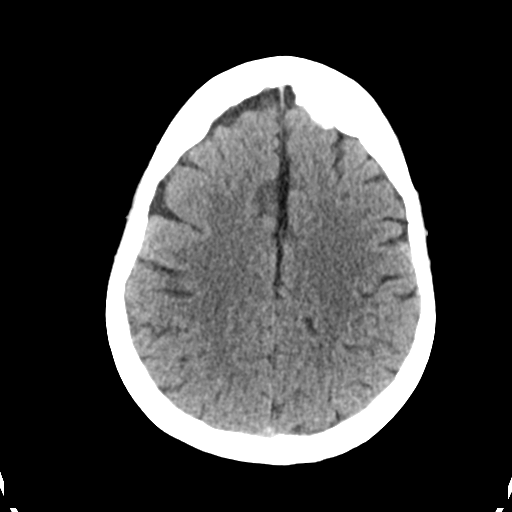
[im 23/31  brain]
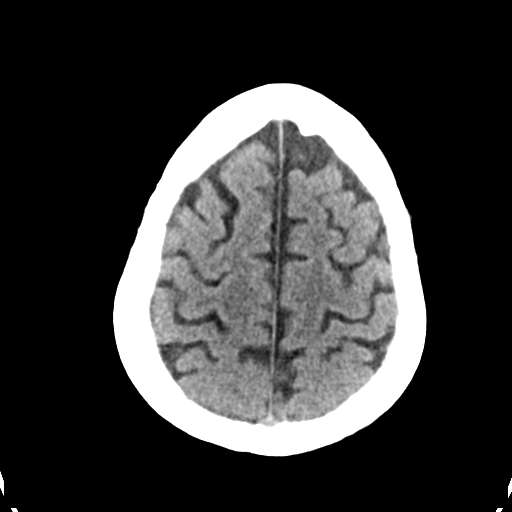
[im 25/31  brain]
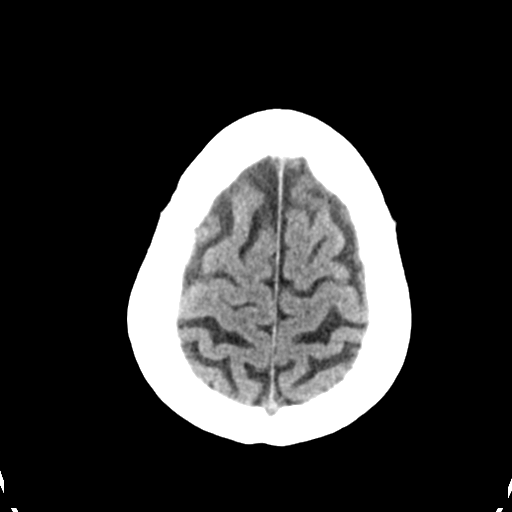
[im 25/31  bone]
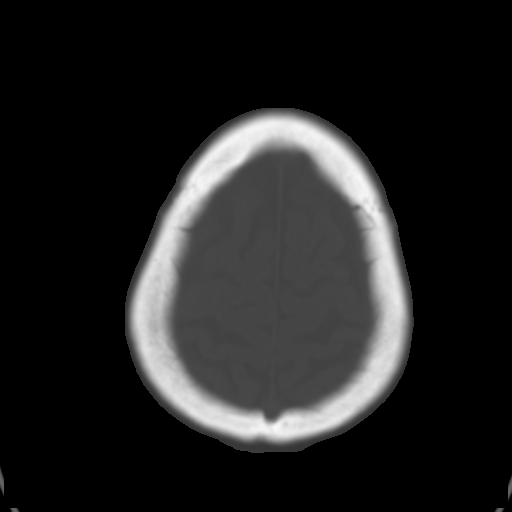
[im 28/31  brain]
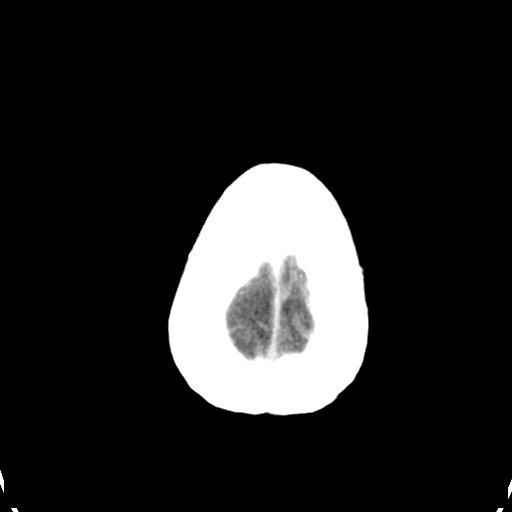

[Series 4: coronal soft tissue · coronal · 0.33mm/px · 3 of 67 slices shown]
[im 23/67  brain]
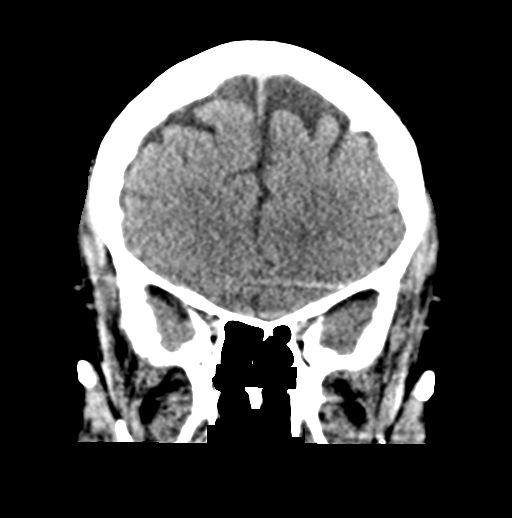
[im 30/67  brain]
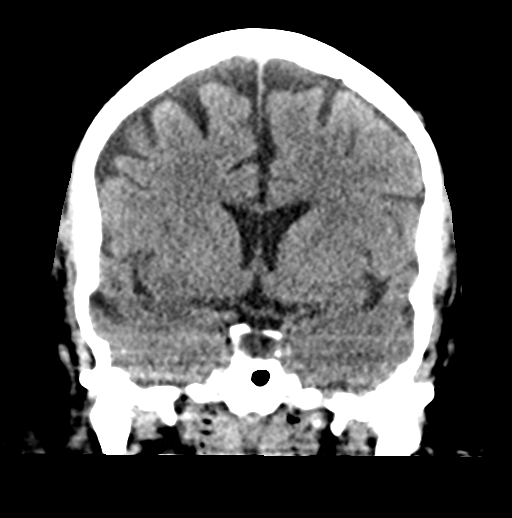
[im 37/67  brain]
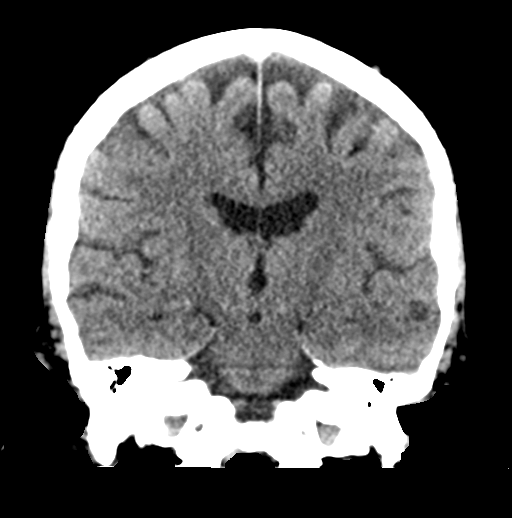

[Series 5: sagittal soft tissue · sagittal · 0.36mm/px · 3 of 55 slices shown]
[im 19/55  brain]
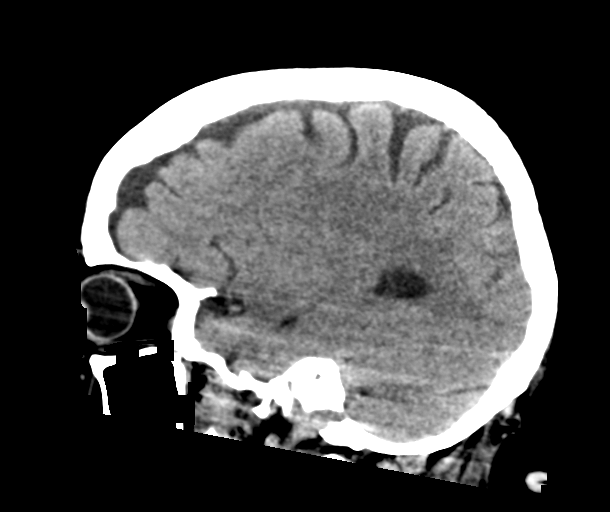
[im 28/55  brain]
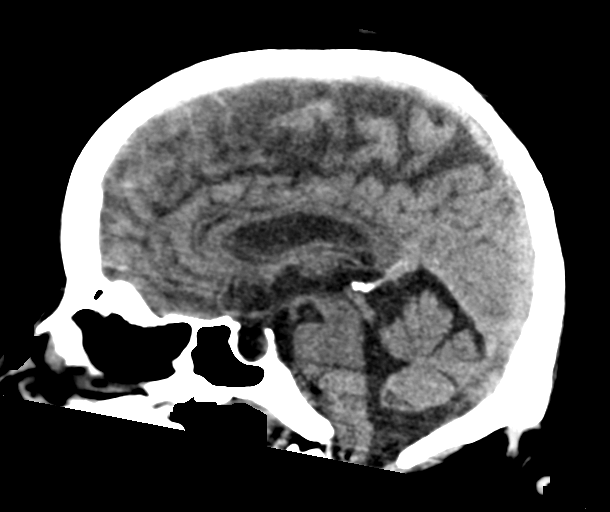
[im 37/55  brain]
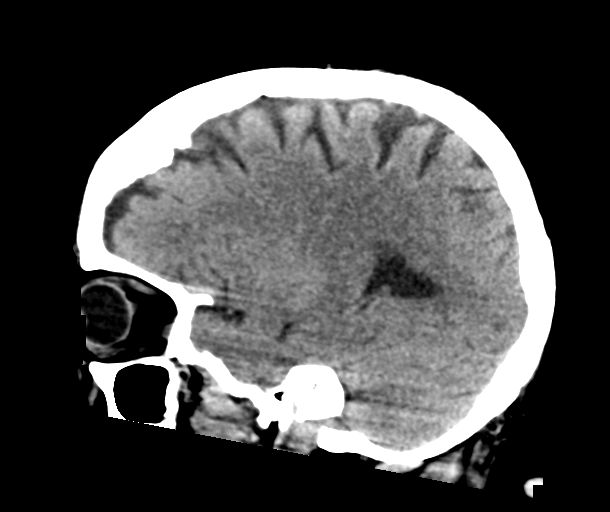

[16 of 47 positions shown; findings below may reference images not displayed]

FINDINGS: Brain: No acute territorial infarction, hemorrhage or intracranial
mass. Mild atrophy. Ventricles are nonenlarged.

Vascular: No hyperdense vessels.  Carotid vascular calcification.

Skull: Normal. Negative for fracture or focal lesion.

Sinuses/Orbits: No acute finding.

Other: Small metallic BB in the left occipital region. No underlying
soft tissue abnormality.
IMPRESSION: Negative non contrasted CT appearance of the brain.

## 2018-07-10 MED ORDER — SODIUM CHLORIDE 0.9 % IV BOLUS
500.0000 mL | Freq: Once | INTRAVENOUS | Status: AC
  Administered 2018-07-10: 500 mL via INTRAVENOUS

## 2018-07-10 MED ORDER — HYDROMORPHONE HCL 1 MG/ML IJ SOLN
0.5000 mg | Freq: Once | INTRAMUSCULAR | Status: AC
  Administered 2018-07-10: 0.5 mg via INTRAVENOUS
  Filled 2018-07-10: qty 1

## 2018-07-10 MED ORDER — METOCLOPRAMIDE HCL 10 MG PO TABS
10.0000 mg | ORAL_TABLET | Freq: Once | ORAL | Status: AC
  Administered 2018-07-10: 10 mg via ORAL
  Filled 2018-07-10: qty 1

## 2018-07-10 MED ORDER — KETOROLAC TROMETHAMINE 30 MG/ML IJ SOLN
30.0000 mg | Freq: Once | INTRAMUSCULAR | Status: AC
  Administered 2018-07-10: 30 mg via INTRAVENOUS
  Filled 2018-07-10: qty 1

## 2018-07-10 MED ORDER — DIPHENHYDRAMINE HCL 50 MG/ML IJ SOLN
12.5000 mg | Freq: Once | INTRAMUSCULAR | Status: AC
  Administered 2018-07-10: 12.5 mg via INTRAVENOUS
  Filled 2018-07-10: qty 1

## 2018-07-10 NOTE — ED Notes (Signed)
Patient transported to CT 

## 2018-07-10 NOTE — ED Triage Notes (Signed)
Pt reports knot came up on back of head today, noticed around noon. Pt reports headache and pain gets worse with moving of head. Reports slight dizziness as well.denies falling or hitting head.

## 2018-07-10 NOTE — ED Notes (Signed)
ED Provider at bedside. 

## 2018-07-11 ENCOUNTER — Telehealth: Payer: Self-pay | Admitting: Cardiovascular Disease

## 2018-07-11 NOTE — Telephone Encounter (Signed)
Attempt to reach, [id number] 

## 2018-07-11 NOTE — Telephone Encounter (Signed)
I reviewed all ED records.  She has rate controlled atrial fibrillation.  It is paroxysmal in nature so she can expect to go in and out of this from time to time.  She does not need to follow-up with [place] this week.  Continue current medications including diltiazem and Eliquis.  No changes to medications at this time.

## 2018-07-11 NOTE — Telephone Encounter (Signed)
Has PAF, seen in ED yesterday for migraine.Told by ED to see [place] in a week.BP was ok, HR in the 60's.Was made apt for end of this month. She is concerned because they told her to see [place] this week . I told her I would FYI you.

## 2018-07-11 NOTE — Telephone Encounter (Signed)
Patient was seen in ED yesterday. Was told she was in AFib and to be seen by Cardiology this week. Patient was given first available appointment which is 10/30. Patient asked if she could get call from nurse to discuss instructions. / tg

## 2018-07-18 ENCOUNTER — Ambulatory Visit
Admission: RE | Admit: 2018-07-18 | Discharge: 2018-07-18 | Disposition: A | Discharge status: Home / Self Care | Payer: Medicare Other | Source: Ambulatory Visit | Attending: Neurology | Admitting: Neurology

## 2018-07-18 DIAGNOSIS — R251 Tremor, unspecified: Secondary | ICD-10-CM | POA: Diagnosis not present

## 2018-07-18 DIAGNOSIS — G2119 Other drug induced secondary parkinsonism: Secondary | ICD-10-CM

## 2018-07-18 DIAGNOSIS — E0842 Diabetes mellitus due to underlying condition with diabetic polyneuropathy: Secondary | ICD-10-CM

## 2018-07-18 MED ORDER — GADOBENATE DIMEGLUMINE 529 MG/ML IV SOLN
20.0000 mL | Freq: Once | INTRAVENOUS | Status: AC | PRN
  Administered 2018-07-18: 20 mL via INTRAVENOUS

## 2018-07-19 ENCOUNTER — Telehealth: Payer: Self-pay

## 2018-07-19 NOTE — Telephone Encounter (Signed)
-----   Message from Star Age, MD sent at 07/19/2018  9:01 AM EDT ----- Please call and advise the patient that the recent scan we did was within normal limits. We did a brain MRI with and wo contrast, which showed normal for age findings. In particular, there were no acute findings, such as a stroke, or mass or blood products. No further action is required on this test at this time. Please remind patient to keep any upcoming appointments or tests and to call us with any interim questions, concerns, problems or updates. Thanks,  Star Age, MD, PhD

## 2018-07-19 NOTE — Telephone Encounter (Signed)
Pt returned my call and I explained her MRI results. Pt will follow up as scheduled in March. Pt verbalized understanding of results. Pt had no questions at this time but was encouraged to call back if questions arise.

## 2018-07-19 NOTE — Progress Notes (Signed)
Please call and advise the patient that the recent scan we did was within normal limits. We did a brain MRI with and wo contrast, which showed normal for age findings. In particular, there were no acute findings, such as a stroke, or mass or blood products. No further action is required on this test at this time. Please remind patient to keep any upcoming appointments or tests and to call us with any interim questions, concerns, problems or updates. Thanks,  Star Age, MD, PhD

## 2018-07-19 NOTE — Telephone Encounter (Signed)
I called pt to discuss her MRI results. No answer, left a message asking her to call me back. 

## 2018-07-20 NOTE — ED Provider Notes (Signed)
[facility] EMERGENCY DEPARTMENT Provider Note   CSN: [id number] Arrival date & time: 07/10/18  1527     History   Chief Complaint Chief Complaint  Patient presents with  . Cyst    HPI Kyran Whittier is a 57 y.o. female.  HPI   58 year old female with headache.  She feels like there is and on the back of her head.  Noticed around noon today.  Headache has been progressively worsening.  She cannot remember she is doing when she first noticed it.  It is worse with certain movements.  No acute visual changes.  No acute numbness tingling or focal loss of strength.  Past Medical History:  Diagnosis Date  . Asthma   . Asthma   . Bipolar disorder ([place])   . Bronchiectasis ([place])   . CHF (congestive heart failure) ([place])   . Hypogammaglobulinemia ([place])   . Hypothyroid   . 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 Osteoarthritis   . PTSD (post-traumatic stress disorder)   . Recurrent sinusitis   . Type 2 diabetes mellitus ([place])   . Wound infection     Patient Active Problem List   Diagnosis Date Noted  . Uncontrolled type 2 diabetes mellitus with hyperglycemia ([place]) 03/13/2018  . Hypothyroidism 03/13/2018  . Essential hypertension, benign 03/13/2018  . Mixed hyperlipidemia 03/13/2018  . Hypogammaglobulinemia ([place]) 02/06/2018  . Non-allergic rhinitis 02/06/2018  . Severe persistent asthma, uncomplicated 52/84/1324  . Pulmonary nodules 02/06/2018  . Bronchiectasis without complication ([place]) 40/07/2724  . DM type 2 causing vascular disease ([place]) 02/06/2018    Past Surgical History:  Procedure Laterality Date  . 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 CARDIAC SURGERY    . [place]  . NASAL SINUS SURGERY  2017     OB History   None      Home Medications    Prior to Admission medications   Medication Sig Start Date End Date Taking? Authorizing Provider  Albuterol Sulfate (PROAIR RESPICLICK) 366 (90 Base) MCG/ACT AEPB Inhale 1 puff into the lungs every 6 (six) hours as  needed (for shortness of breath/wheezing).    Yes [provider]  ALPRAZolam Duanne Moron) 0.5 MG tablet Take 0.5 mg by mouth daily as needed for anxiety.  03/06/18  Yes [provider]  apixaban (ELIQUIS) 5 MG TABS tablet Take 5 mg by mouth 2 (two) times daily.   Yes [provider]  ARIPiprazole (ABILIFY) 10 MG tablet Take 10 mg by mouth daily.  04/16/18  Yes [provider]  atorvastatin (LIPITOR) 10 MG tablet Take 10 mg by mouth daily.   Yes [provider]  diltiazem (CARDIZEM CD) 300 MG 24 hr capsule Take 300 mg by mouth daily.   Yes [provider]  divalproex (DEPAKOTE ER) 500 MG 24 hr tablet Take 1,500 mg by mouth 2 (two) times daily.   Yes [provider]  Dulaglutide (TRULICITY) 1.5 YQ/0.3KV SOPN Inject 1.5 mg into the skin every Sunday.    Yes [provider]  furosemide (LASIX) 20 MG tablet Take 20-40 mg by mouth See admin instructions. Take 2 tablets (40mg  total) every morning and 1 tablet (20mg  total)  at noon.   Yes [provider]  gabapentin (NEURONTIN) 600 MG tablet Take 600 mg by mouth 3 (three) times daily.   Yes [provider]  hydrALAZINE (APRESOLINE) 25 MG tablet Take 25 mg by mouth 3 (three) times daily.   Yes [provider]  hydrochlorothiazide (HYDRODIURIL)  25 MG tablet Take 25 mg by mouth daily.   Yes [provider]  insulin glargine (LANTUS) 100 UNIT/ML injection Inject 60 Units into the skin at bedtime.   Yes [provider]  lamoTRIgine (LAMICTAL) 100 MG tablet Take 50 mg by mouth at bedtime.    Yes [provider]  levothyroxine (SYNTHROID, LEVOTHROID) 112 MCG tablet Take 112 mcg by mouth daily before breakfast.   Yes [provider]  lisinopril (PRINIVIL,ZESTRIL) 40 MG tablet Take 40 mg by mouth daily.   Yes [provider]  Melatonin 3 MG TABS Take 3 mg by mouth at bedtime.   Yes [provider]  metFORMIN  (GLUCOPHAGE-XR) 500 MG 24 hr tablet Take 1,000 mg by mouth 2 (two) times daily.  04/11/18  Yes [provider]  mometasone-formoterol (DULERA) 200-5 MCG/ACT AERO Inhale 2 puffs into the lungs 2 (two) times daily. 03/01/18  Yes Juanito Doom, MD  hydrOXYzine (ATARAX/VISTARIL) 25 MG tablet Take 25 mg by mouth 3 (three) times daily as needed for anxiety.     [provider]  Immune Globulin, Human, (CUVITRU) 4 GM/20ML SOLN Inject 100 mLs into the skin every 14 (fourteen) days.    [provider]  ONE TOUCH ULTRA TEST test strip TEST     QID UTD 03/02/18   [provider]  Spacer/Aero-Holding Chambers (AEROCHAMBER MV) inhaler Use as instructed 03/01/18   Juanito Doom, MD  tiotropium (SPIRIVA) 18 MCG inhalation capsule Place 18 mcg into inhaler and inhale daily as needed (for breathing).     [provider]    Family History Family History  Problem Relation Age of Onset  . Hypertension Mother   . Stroke Mother   . Kidney disease Mother   . Heart attack Mother   . Kidney disease Sister   . Heart disease Brother   . Stroke Maternal Aunt   . Heart disease Maternal Aunt   . Hypertension Sister   . Bipolar disorder Sister   . Thyroid disease Sister   . Thyroid disease Daughter   . Thyroid disease Daughter   . Hashimoto's thyroiditis Daughter   . Celiac disease Daughter   . Thyroid disease Daughter     Social History Social History   Tobacco Use  . Smoking status: Former Smoker    Packs/day: 1.00    Years: 17.00    Pack years: 17.00    Types: Cigarettes    Last attempt to quit: 2008    Years since quitting: 11.7  . Smokeless tobacco: Never Used  Substance Use Topics  . Alcohol use: Not Currently  . Drug use: Not Currently     Allergies   Ativan [lorazepam] and Phenergan [promethazine hcl]   Review of Systems Review of Systems  All systems reviewed and negative, other than as noted in HPI.  Physical Exam Updated Vital  Signs BP (!) 108/27   Pulse 65   Temp 98.4 F (36.9 C) (Oral)   Resp 15   Ht 5\' 4"  (1.626 m)   Wt 109.7 kg   SpO2 96%   BMI 41.51 kg/m   Physical Exam  Constitutional: She is oriented to person, place, and time. She appears well-developed and well-nourished. No distress.  HENT:  Head: Normocephalic and atraumatic.  No scalp lesions noted.  Eyes: Conjunctivae are normal. Right eye exhibits no discharge. Left eye exhibits no discharge.  Neck: Neck supple.  No nuchal rigidity  Cardiovascular: Normal rate, regular rhythm and normal heart  sounds. Exam reveals no gallop and no friction rub.  No murmur heard. Pulmonary/Chest: Effort normal and breath sounds normal. No respiratory distress.  Abdominal: Soft. She exhibits no distension. There is no tenderness.  Musculoskeletal: She exhibits no edema or tenderness.  Neurological: She is alert and oriented to person, place, and time. No cranial nerve deficit. She exhibits normal muscle tone. Coordination normal.  Skin: Skin is warm and dry.  Psychiatric: She has a normal mood and affect. Her behavior is normal. Thought content normal.  Nursing note and vitals reviewed.    ED Treatments / Results  Labs (all labs ordered are listed, but only abnormal results are displayed) Labs Reviewed  BASIC METABOLIC PANEL - Abnormal; Notable for the following components:      Result Value   BUN 23 (*)    Calcium 8.7 (*)    All other components within normal limits  CBC WITH DIFFERENTIAL/PLATELET    EKG EKG Interpretation  Date/Time:  Tuesday July 10 2018 17:35:38 EDT Ventricular Rate:  67 PR Interval:    QRS Duration: 162 QT Interval:  472 QTC Calculation: 499 R Axis:   76 Text Interpretation:  Atrial flutter with predominant 4:1 AV block Nonspecific intraventricular conduction delay Confirmed by Virgel Manifold [phone]) on 07/10/2018 6:51:15 PM   Radiology No results found.  Procedures Procedures (including critical care  time)  Ct Head Wo Contrast  Result Date: 07/10/2018 CLINICAL DATA:  Severe headache EXAM: CT HEAD WITHOUT CONTRAST TECHNIQUE: Contiguous axial images were obtained from the base of the skull through the vertex without intravenous contrast. COMPARISON:  None. FINDINGS: Brain: No acute territorial infarction, hemorrhage or intracranial mass. Mild atrophy. Ventricles are nonenlarged. Vascular: No hyperdense vessels.  Carotid vascular calcification. Skull: Normal. Negative for fracture or focal lesion. Sinuses/Orbits: No acute finding. Other: Small metallic BB in the left occipital region. No underlying soft tissue abnormality. IMPRESSION: Negative non contrasted CT appearance of the brain. Electronically Signed   By: Donavan Foil M.D.   On: 07/10/2018 18:21   Mr Jeri Cos RF Contrast  Result Date: 07/18/2018  [facility] [address] [place], [place] [address] [phone] NEUROIMAGING REPORT STUDY DATE: 07/18/2018 PATIENT NAME: Layce Sprung DOB: Oct 22, 1960 MRN: [id number] ORDERING CLINICIAN: Dr Rexene Alberts CLINICAL HISTORY:  54 year patient with bilateral hand tremors COMPARISON FILMS: CT Head 07/10/18 EXAM: MRI Brain w/wo TECHNIQUE: MRI of the brain with and without contrast was obtained utilizing 5 mm axial slices with T1, T2, T2 flair, T2 star gradient echo and diffusion weighted views.  T1 sagittal, T2 coronal and postcontrast views in the axial and coronal plane were obtained. CONTRAST: 15 ml iv multihance IMAGING SITE: [facility] FINDINGS: The brain parenchyma shows minor changes of age-related chronic microvascular ischemia.  No structural lesion, tumor or infarcts are noted.  Gradient echo sequences show few areas of microhemorrhages in the left frontal and parietal subcortical white matter and midline cerebellum on the right likely from microvascular disease.  The paranasal sinuses show mild chronic inflammatory changes.  The pituitary sella is enlarged with atrophic  pituitary.  There are mild degenerative changes of the upper cervical spine noted mostly at C3-4.No abnormal lesions are seen on diffusion-weighted views to suggest acute ischemia. The cortical sulci, fissures and cisterns are normal in size and appearance. Lateral, third and fourth ventricle are normal in size and appearance. No extra-axial fluid collections are seen. No evidence of mass effect or midline shift.  No abnormal lesions are seen on post  contrast views.  On sagittal views the posterior fossa, pituitary gland and corpus callosum are unremarkable. No evidence of intracranial hemorrhage on gradient-echo views. The orbits and their contents, paranasal sinuses and calvarium are unremarkable.  Intracranial flow voids are present.   Unremarkable MRI scan of the brain with and without contrast INTERPRETING PHYSICIAN: PRAMOD SETHI, MD Certified in  Neuroimaging by [place] [employer] and [employer]    Medications Ordered in ED Medications  sodium chloride 0.9 % bolus 500 mL (0 mLs Intravenous Stopped 07/10/18 1906)  metoCLOPramide (REGLAN) tablet 10 mg (10 mg Oral Given 07/10/18 1729)  diphenhydrAMINE (BENADRYL) injection 12.5 mg (12.5 mg Intravenous Given 07/10/18 1729)  ketorolac (TORADOL) 30 MG/ML injection 30 mg (30 mg Intravenous Given 07/10/18 1906)  HYDROmorphone (DILAUDID) injection 0.5 mg (0.5 mg Intravenous Given 07/10/18 1940)     Initial Impression / Assessment and Plan / ED Course  I have reviewed the triage vital signs and the nursing notes.  Pertinent labs & imaging results that were available during my care of the patient were reviewed by me and considered in my medical decision making (see chart for details).    57 year old female with headache.  Unclear etiology, but I doubt emergent process.  Treated symptomatically with improvement.  Nonfocal neurological examination.  CT of the head without acute pathology.  Plan continue  systematic treatment.  Return precautions were discussed.  Outpatient follow-up otherwise.   Final Clinical Impressions(s) / ED Diagnoses   Final diagnoses:  Nonintractable headache, unspecified chronicity pattern, unspecified headache type    ED Discharge Orders    None       Virgel Manifold, MD 07/20/18 2346

## 2018-07-31 DIAGNOSIS — E039 Hypothyroidism, unspecified: Secondary | ICD-10-CM | POA: Diagnosis not present

## 2018-07-31 DIAGNOSIS — I10 Essential (primary) hypertension: Secondary | ICD-10-CM | POA: Diagnosis not present

## 2018-07-31 DIAGNOSIS — E1140 Type 2 diabetes mellitus with diabetic neuropathy, unspecified: Secondary | ICD-10-CM | POA: Diagnosis not present

## 2018-07-31 DIAGNOSIS — Z6841 Body Mass Index (BMI) 40.0 and over, adult: Secondary | ICD-10-CM | POA: Diagnosis not present

## 2018-07-31 DIAGNOSIS — M722 Plantar fascial fibromatosis: Secondary | ICD-10-CM | POA: Diagnosis not present

## 2018-07-31 DIAGNOSIS — E782 Mixed hyperlipidemia: Secondary | ICD-10-CM | POA: Diagnosis not present

## 2018-08-03 DIAGNOSIS — E782 Mixed hyperlipidemia: Secondary | ICD-10-CM | POA: Diagnosis not present

## 2018-08-03 DIAGNOSIS — J45909 Unspecified asthma, uncomplicated: Secondary | ICD-10-CM | POA: Diagnosis not present

## 2018-08-03 DIAGNOSIS — M1990 Unspecified osteoarthritis, unspecified site: Secondary | ICD-10-CM | POA: Diagnosis not present

## 2018-08-03 DIAGNOSIS — D803 Selective deficiency of immunoglobulin G [IgG] subclasses: Secondary | ICD-10-CM | POA: Diagnosis not present

## 2018-08-03 DIAGNOSIS — R42 Dizziness and giddiness: Secondary | ICD-10-CM | POA: Diagnosis not present

## 2018-08-03 DIAGNOSIS — G9009 Other idiopathic peripheral autonomic neuropathy: Secondary | ICD-10-CM | POA: Diagnosis not present

## 2018-08-03 DIAGNOSIS — Z23 Encounter for immunization: Secondary | ICD-10-CM | POA: Diagnosis not present

## 2018-08-03 DIAGNOSIS — I4891 Unspecified atrial fibrillation: Secondary | ICD-10-CM | POA: Diagnosis not present

## 2018-08-03 DIAGNOSIS — F319 Bipolar disorder, unspecified: Secondary | ICD-10-CM | POA: Diagnosis not present

## 2018-08-03 DIAGNOSIS — I10 Essential (primary) hypertension: Secondary | ICD-10-CM | POA: Diagnosis not present

## 2018-08-03 DIAGNOSIS — E039 Hypothyroidism, unspecified: Secondary | ICD-10-CM | POA: Diagnosis not present

## 2018-08-03 DIAGNOSIS — E1140 Type 2 diabetes mellitus with diabetic neuropathy, unspecified: Secondary | ICD-10-CM | POA: Diagnosis not present

## 2018-08-08 ENCOUNTER — Ambulatory Visit (INDEPENDENT_AMBULATORY_CARE_PROVIDER_SITE_OTHER): Payer: Medicare Other | Admitting: Student

## 2018-08-08 ENCOUNTER — Encounter: Payer: Self-pay | Admitting: Student

## 2018-08-08 VITALS — BP 132/80 | HR 95 | Ht 64.0 in | Wt 244.0 lb

## 2018-08-08 DIAGNOSIS — Z7901 Long term (current) use of anticoagulants: Secondary | ICD-10-CM | POA: Diagnosis not present

## 2018-08-08 DIAGNOSIS — I4892 Unspecified atrial flutter: Secondary | ICD-10-CM | POA: Diagnosis not present

## 2018-08-08 DIAGNOSIS — Z8774 Personal history of (corrected) congenital malformations of heart and circulatory system: Secondary | ICD-10-CM | POA: Diagnosis not present

## 2018-08-08 DIAGNOSIS — I10 Essential (primary) hypertension: Secondary | ICD-10-CM

## 2018-08-08 NOTE — Progress Notes (Signed)
Cardiology Office Note    Date:  08/08/2018   ID:  Laura [name], DOB [birth year], MRN [id number]  PCP:  [name], MD  Cardiologist: Kate Sable, MD    Chief Complaint  Patient presents with  . Follow-up    recent Emergency Dept visit    History of Present Illness:    Laura [name] is a 57 y.o. female with past medical history of PAF, prior ASD repair (at 57 years of age), IDDM, HTN, OSA, severe persistent asthma, and Bipolar Disorder who presents to the office today for follow-up from her recent Emergency Department visit.  She was last examined Dr. Bronson Ing in 03/2018 and reported occasional palpitations in regards to her atrial fibrillation but denied any persistent symptoms. She was continued on her current medication regimen at that time including Cardizem CD 300 mg daily and Eliquis for anticoagulation. An echocardiogram have been obtained in 2016 by her report when she was living in [place], therefore these records were requested.  In the interim, she was evaluated at [facility] ED on 07/10/2018 for headaches but was found to be in rate controlled atrial flutter. Labs at that time showed WBC 6.0, Hgb 14.0, platelets 203, Na+ 139, K+ 4.2, and creatinine 0.88. She was informed to follow-up with Cardiology in the outpatient setting for further evaluation.  In talking with the patient today, she reports having frequent episodes of her heart "fluttering" and feels like this has been occurring since her ED visit. She was surprised to find out that she was out of rhythm as she reports having a cardioversion 10+ years ago and has maintained normal sinus rhythm since by her report with only occasional palpitations. She denies any associated tachypalpitations. No associated dyspnea, lightheadedness, dizziness, presyncope, or chest pain. She denies any recent orthopnea, PND, or lower extremity edema. She does have an essential tremor which has been present for several months per  her report.  She has remained on Eliquis for anticoagulation but reports missing 1-2 doses early last week. She denies any recent melena, hematochezia, or hematuria.  Past Medical History:  Diagnosis Date  . Asthma   . Atrial fibrillation ([place])   . Bipolar disorder ([place])   . Bronchiectasis ([place])   . CHF (congestive heart failure) ([place])   . Hypogammaglobulinemia ([place])   . Hypothyroid   . 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 Osteoarthritis   . PTSD (post-traumatic stress disorder)   . Recurrent sinusitis   . Type 2 diabetes mellitus ([place])   . Wound infection     Past Surgical History:  Procedure Laterality Date  . 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 CARDIAC SURGERY    . [place]  . NASAL SINUS SURGERY  2017    Current Medications: Outpatient Medications Prior to Visit  Medication Sig Dispense Refill  . Albuterol Sulfate (PROAIR RESPICLICK) 782 (90 Base) MCG/ACT AEPB Inhale 1 puff into the lungs every 6 (six) hours as needed (for shortness of breath/wheezing).     . ALPRAZolam (XANAX) 0.5 MG tablet Take 0.5 mg by mouth daily as needed for anxiety.   0  . apixaban (ELIQUIS) 5 MG TABS tablet Take 5 mg by mouth 2 (two) times daily.    . ARIPiprazole (ABILIFY) 10 MG tablet Take 10 mg by mouth daily.   0  . atorvastatin (LIPITOR) 10 MG tablet Take 10 mg by mouth daily.    Marland Kitchen diltiazem (CARDIZEM CD) 300 MG 24 hr capsule Take 300 mg  by mouth daily.    . divalproex (DEPAKOTE ER) 500 MG 24 hr tablet Take 1,500 mg by mouth 2 (two) times daily.    . Dulaglutide (TRULICITY) 1.5 XF/8.1WE SOPN Inject 1.5 mg into the skin every Sunday.     . furosemide (LASIX) 20 MG tablet Take 20-40 mg by mouth See admin instructions. Take 2 tablets (40mg  total) every morning and 1 tablet (20mg  total)  at noon.    . gabapentin (NEURONTIN) 600 MG tablet Take 600 mg by mouth 3 (three) times daily.    . hydrALAZINE (APRESOLINE) 25 MG tablet Take 25 mg by mouth 3 (three) times daily.    . hydrochlorothiazide  (HYDRODIURIL) 25 MG tablet Take 25 mg by mouth daily.    . hydrOXYzine (ATARAX/VISTARIL) 25 MG tablet Take 25 mg by mouth 3 (three) times daily as needed for anxiety.     . Immune Globulin, Human, (CUVITRU) 4 GM/20ML SOLN Inject 100 mLs into the skin every 14 (fourteen) days.    . insulin glargine (LANTUS) 100 UNIT/ML injection Inject 60 Units into the skin at bedtime.    . lamoTRIgine (LAMICTAL) 100 MG tablet Take 50 mg by mouth at bedtime.     Marland Kitchen levothyroxine (SYNTHROID, LEVOTHROID) 112 MCG tablet Take 112 mcg by mouth daily before breakfast.    . lisinopril (PRINIVIL,ZESTRIL) 40 MG tablet Take 40 mg by mouth daily.    . Melatonin 3 MG TABS Take 3 mg by mouth at bedtime.    . metFORMIN (GLUCOPHAGE-XR) 500 MG 24 hr tablet Take 1,000 mg by mouth 2 (two) times daily.   0  . mometasone-formoterol (DULERA) 200-5 MCG/ACT AERO Inhale 2 puffs into the lungs 2 (two) times daily. 1 Inhaler 0  . ONE TOUCH ULTRA TEST test strip TEST     QID UTD  1  . Spacer/Aero-Holding Chambers (AEROCHAMBER MV) inhaler Use as instructed 1 each 0  . tiotropium (SPIRIVA) 18 MCG inhalation capsule Place 18 mcg into inhaler and inhale daily as needed (for breathing).      No facility-administered medications prior to visit.      Allergies:   Ativan [lorazepam] and Phenergan [promethazine hcl]   Social History   Socioeconomic History  . Marital status: Widowed    Spouse name: Not on file  . Number of children: Not on file  . Years of education: Not on file  . Highest education level: Not on file  Occupational History  . Not on file  Social Needs  . Financial resource strain: Not on file  . Food insecurity:    Worry: Not on file    Inability: Not on file  . Transportation needs:    Medical: Not on file    Non-medical: Not on file  Tobacco Use  . Smoking status: Former Smoker    Packs/day: 1.00    Years: 17.00    Pack years: 17.00    Types: Cigarettes    Last attempt to quit: 2008    Years since quitting:  11.8  . Smokeless tobacco: Never Used  Substance and Sexual Activity  . Alcohol use: Not Currently  . Drug use: Not Currently  . Sexual activity: Not on file  Lifestyle  . Physical activity:    Days per week: Not on file    Minutes per session: Not on file  . Stress: Not on file  Relationships  . Social connections:    Talks on phone: Not on file    Gets together: Not on file  Attends religious service: Not on file    Active member of club or organization: Not on file    Attends meetings of clubs or organizations: Not on file    Relationship status: Not on file  Other Topics Concern  . Not on file  Social History Narrative  . Not on file     Family History:  The patient's family history includes Bipolar disorder in her sister; Celiac disease in her daughter; Hashimoto's thyroiditis in her daughter; Heart attack in her mother; Heart disease in her brother and maternal aunt; Hypertension in her mother and sister; Kidney disease in her mother and sister; Stroke in her maternal aunt and mother; Thyroid disease in her daughter, daughter, daughter, and sister.   Review of Systems:   Please see the history of present illness.     General:  No chills, fever, night sweats or weight changes.  Cardiovascular:  No chest pain, dyspnea on exertion, edema, orthopnea,  paroxysmal nocturnal dyspnea. Positive for palpitations.  Dermatological: No rash, lesions/masses Respiratory: No cough, dyspnea Urologic: No hematuria, dysuria Abdominal:   No nausea, vomiting, diarrhea, bright red blood per rectum, melena, or hematemesis Neurologic:  No visual changes, wkns, changes in mental status. All other systems reviewed and are otherwise negative except as noted above.   Physical Exam:    VS:  BP 132/80 (BP Location: Left Arm)   Pulse 95   Ht 5\' 4"  (1.626 m)   Wt 244 lb (110.7 kg)   SpO2 95%   BMI 41.88 kg/m    General: Well developed, well nourished Caucasian female appearing in no acute  distress. Head: Normocephalic, atraumatic, sclera non-icteric, no xanthomas, nares are without discharge.  Neck: No carotid bruits. JVD not elevated.  Lungs: Respirations regular and unlabored, without wheezes or rales.  Heart: Irregularly irregular. No S3 or S4.  No murmur, no rubs, or gallops appreciated. Abdomen: Soft, non-tender, non-distended with normoactive bowel sounds. No hepatomegaly. No rebound/guarding. No obvious abdominal masses. Msk:  Strength and tone appear normal for age. No joint deformities or effusions. Extremities: No clubbing or cyanosis. No lower extremity edema.  Distal pedal pulses are 2+ bilaterally. Neuro: Alert and oriented X 3. Moves all extremities spontaneously. No focal deficits noted. Psych:  Responds to questions appropriately with a normal affect. Skin: No rashes or lesions noted  Wt Readings from Last 3 Encounters:  08/08/18 244 lb (110.7 kg)  07/10/18 241 lb 13.5 oz (109.7 kg)  07/03/18 242 lb (109.8 kg)      Studies/Labs Reviewed:   EKG:  EKG is ordered today.  The ekg ordered today demonstrates rate-controlled atrial flutter, HR 88.  Recent Labs: 03/13/2018: ALT 8; TSH 1.00 07/10/2018: BUN 23; Creatinine, Ser 0.88; Hemoglobin 14.0; Platelets 203; Potassium 4.2; Sodium 139   Lipid Panel No results found for: CHOL, TRIG, HDL, CHOLHDL, VLDL, LDLCALC, LDLDIRECT  Additional studies/ records that were reviewed today include:   CXR: 06/2018 FINDINGS: No pneumothorax. The heart size borderline. The hila and mediastinum are unremarkable. No overt edema, nodule, mass, or focal infiltrate.  IMPRESSION: No active cardiopulmonary disease.  Echocardiogram - Ordered. No prior imaging on File.   Assessment:    1. Atrial flutter with controlled response ([place])   2. Current use of long term anticoagulation   3. Essential hypertension   4. H/O congenital atrial septal defect (ASD) repair      Plan:   In order of problems listed above:  1.  Persistent Atrial Flutter/ History of Atrial Fibrillation -  underwent successful DCCV for atrial fibrillation 10+ years ago by her report and had maintained NSR since until recently diagnosed with atrial flutter earlier this month. She denies any associated chest pain or dyspnea but has experienced a "fluttering" in her chest since being diagnosed.  - computer-generated read for EKG today initially said NSR but it is difficult to discern actual p-waves. Reviewed with Dr. Bronson Ing and he was in agreement she appears to still be in flutter. Will plan to perform a DCCV following 3 weeks of uninterrupted anticoagulation (procedure initially scheduled with Dr. Bronson Ing but needed to be switched to Dr. Harl Bowie given she missed a dose of Eliquis last week and she is going out of town on 09/01/2018). Procedure, risks, and benefits were reviewed with the patient and she agrees to proceed.  - will update her echocardiogram as most recent imaging was performed in 2016. Just had labs by her PCP and will request these records. Continue Cardizem CD for rate-control along with Eliquis for anticoagulation.   2. Use of Long-Term Anticoagulation - Denies any evidence of active bleeding. She did miss 1-2 doses of Eliquis early last week. Importance of compliance with anticoagulation was reviewed with the patient, especially given her upcoming DCCV. Was advised to make [place] aware if she misses any doses in the interim.   3. HTN - BP is well-controlled at 132/80 during today's visit.  - continue current medication regimen.   4. History of ASD Repair - occurring at age 27 by the patient's report. Will obtain a repeat echocardiogram as outlined above.   Medication Adjustments/Labs and Tests Ordered: Current medicines are reviewed at length with the patient today.  Concerns regarding medicines are outlined above.  Medication changes, Labs and Tests ordered today are listed in the Patient Instructions below. Patient  Instructions  Medication Instructions:  Your physician recommends that you continue on your current medications as directed. Please refer to the Current Medication list given to you today.   Labwork: I WILL REQUEST LABS FROM PCP  Testing/Procedures: Your physician has recommended that you have a Cardioversion (DCCV). Electrical Cardioversion uses a jolt of electricity to your heart either through paddles or wired patches attached to your chest. This is a controlled, usually prescheduled, procedure. Defibrillation is done under light anesthesia in the hospital, and you usually go home the day of the procedure. This is done to get your heart back into a normal rhythm. You are not awake for the procedure. Please see the instruction sheet given to you today.  Your physician has requested that you have an echocardiogram. Echocardiography is a painless test that uses sound waves to create images of your heart. It provides your doctor with information about the size and shape of your heart and how well your heart's chambers and valves are working. This procedure takes approximately one hour. There are no restrictions for this procedure.     Follow-Up: Your physician recommends that you schedule a follow-up appointment in: 5-6 WEEKS POST CARDIOVERSION   Any Other Special Instructions Will Be Listed Below (If Applicable).    If you need a refill on your cardiac medications before your next appointment, please call your pharmacy.      Signed, Erma Heritage, PA-C  08/08/2018 8:20 PM    [place] [address] [place], [place] [address] Phone: [phone]

## 2018-08-08 NOTE — Patient Instructions (Addendum)
Medication Instructions:  Your physician recommends that you continue on your current medications as directed. Please refer to the Current Medication list given to you today.   Labwork: I WILL REQUEST LABS FROM PCP  Testing/Procedures: Your physician has recommended that you have a Cardioversion (DCCV). Electrical Cardioversion uses a jolt of electricity to your heart either through paddles or wired patches attached to your chest. This is a controlled, usually prescheduled, procedure. Defibrillation is done under light anesthesia in the hospital, and you usually go home the day of the procedure. This is done to get your heart back into a normal rhythm. You are not awake for the procedure. Please see the instruction sheet given to you today.  Your physician has requested that you have an echocardiogram. Echocardiography is a painless test that uses sound waves to create images of your heart. It provides your doctor with information about the size and shape of your heart and how well your heart's chambers and valves are working. This procedure takes approximately one hour. There are no restrictions for this procedure.     Follow-Up: Your physician recommends that you schedule a follow-up appointment in: 5-6 WEEKS POST CARDIOVERSION   Any Other Special Instructions Will Be Listed Below (If Applicable).    If you need a refill on your cardiac medications before your next appointment, please call your pharmacy.

## 2018-08-10 ENCOUNTER — Telehealth: Payer: Self-pay | Admitting: *Deleted

## 2018-08-10 ENCOUNTER — Other Ambulatory Visit: Payer: Self-pay

## 2018-08-10 ENCOUNTER — Observation Stay (HOSPITAL_COMMUNITY)
Admission: EM | Admit: 2018-08-10 | Discharge: 2018-08-11 | Disposition: A | Discharge status: Home / Self Care | Payer: Medicare Other | Attending: Family Medicine | Admitting: Family Medicine

## 2018-08-10 ENCOUNTER — Emergency Department (HOSPITAL_COMMUNITY): Payer: Medicare Other

## 2018-08-10 ENCOUNTER — Encounter (HOSPITAL_COMMUNITY): Payer: Self-pay | Admitting: Emergency Medicine

## 2018-08-10 DIAGNOSIS — Z7901 Long term (current) use of anticoagulants: Secondary | ICD-10-CM | POA: Diagnosis not present

## 2018-08-10 DIAGNOSIS — R519 Headache, unspecified: Secondary | ICD-10-CM

## 2018-08-10 DIAGNOSIS — R0789 Other chest pain: Secondary | ICD-10-CM | POA: Diagnosis not present

## 2018-08-10 DIAGNOSIS — E039 Hypothyroidism, unspecified: Secondary | ICD-10-CM | POA: Diagnosis not present

## 2018-08-10 DIAGNOSIS — R0602 Shortness of breath: Secondary | ICD-10-CM | POA: Diagnosis not present

## 2018-08-10 DIAGNOSIS — I509 Heart failure, unspecified: Secondary | ICD-10-CM | POA: Insufficient documentation

## 2018-08-10 DIAGNOSIS — E1159 Type 2 diabetes mellitus with other circulatory complications: Secondary | ICD-10-CM

## 2018-08-10 DIAGNOSIS — Z9989 Dependence on other enabling machines and devices: Secondary | ICD-10-CM

## 2018-08-10 DIAGNOSIS — E119 Type 2 diabetes mellitus without complications: Secondary | ICD-10-CM | POA: Diagnosis not present

## 2018-08-10 DIAGNOSIS — Z87891 Personal history of nicotine dependence: Secondary | ICD-10-CM | POA: Insufficient documentation

## 2018-08-10 DIAGNOSIS — E782 Mixed hyperlipidemia: Secondary | ICD-10-CM | POA: Diagnosis present

## 2018-08-10 DIAGNOSIS — J4550 Severe persistent asthma, uncomplicated: Secondary | ICD-10-CM | POA: Diagnosis present

## 2018-08-10 DIAGNOSIS — Z794 Long term (current) use of insulin: Secondary | ICD-10-CM | POA: Insufficient documentation

## 2018-08-10 DIAGNOSIS — R51 Headache: Secondary | ICD-10-CM

## 2018-08-10 DIAGNOSIS — R079 Chest pain, unspecified: Secondary | ICD-10-CM | POA: Diagnosis not present

## 2018-08-10 DIAGNOSIS — F419 Anxiety disorder, unspecified: Secondary | ICD-10-CM

## 2018-08-10 DIAGNOSIS — I110 Hypertensive heart disease with heart failure: Secondary | ICD-10-CM | POA: Diagnosis not present

## 2018-08-10 DIAGNOSIS — Z79899 Other long term (current) drug therapy: Secondary | ICD-10-CM | POA: Diagnosis not present

## 2018-08-10 DIAGNOSIS — R002 Palpitations: Secondary | ICD-10-CM | POA: Diagnosis not present

## 2018-08-10 DIAGNOSIS — G4733 Obstructive sleep apnea (adult) (pediatric): Secondary | ICD-10-CM

## 2018-08-10 DIAGNOSIS — I10 Essential (primary) hypertension: Secondary | ICD-10-CM | POA: Diagnosis present

## 2018-08-10 DIAGNOSIS — E1165 Type 2 diabetes mellitus with hyperglycemia: Secondary | ICD-10-CM | POA: Diagnosis present

## 2018-08-10 DIAGNOSIS — Z8679 Personal history of other diseases of the circulatory system: Secondary | ICD-10-CM

## 2018-08-10 DIAGNOSIS — F319 Bipolar disorder, unspecified: Secondary | ICD-10-CM

## 2018-08-10 HISTORY — DX: Tremor, unspecified: R25.1

## 2018-08-10 LAB — TROPONIN I: Troponin I: <0.03 ng/mL (ref <0.03)

## 2018-08-10 LAB — BASIC METABOLIC PANEL
Anion gap: 12 (ref 5–15)
BUN: 17 mg/dL (ref 6–20)
CALCIUM: 10.2 mg/dL (ref 8.9–10.3)
CHLORIDE: 101 mmol/L (ref 98–111)
CO2: 26 mmol/L (ref 22–32)
CREATININE: 0.58 mg/dL (ref 0.44–1.00)
GFR calc Af Amer: >60 mL/min (ref >60)
GFR calc non Af Amer: >60 mL/min (ref >60)
Glucose, Bld: 107 mg/dL — ABNORMAL HIGH (ref 70–99)
Potassium: 4 mmol/L (ref 3.5–5.1)
SODIUM: 139 mmol/L (ref 135–145)

## 2018-08-10 LAB — CBC WITH DIFFERENTIAL/PLATELET
ABS IMMATURE GRANULOCYTES: 0.03 10*3/uL (ref 0.00–0.07)
Basophils Absolute: 0 10*3/uL (ref 0.0–0.1)
Basophils Relative: 1 %
EOS ABS: 0.1 10*3/uL (ref 0.0–0.5)
Eosinophils Relative: 1 %
HEMATOCRIT: 46.6 % — AB (ref 36.0–46.0)
Hemoglobin: 15.9 g/dL — ABNORMAL HIGH (ref 12.0–15.0)
IMMATURE GRANULOCYTES: 0 %
LYMPHS ABS: 2.7 10*3/uL (ref 0.7–4.0)
Lymphocytes Relative: 38 %
MCH: 31.5 pg (ref 26.0–34.0)
MCHC: 34.1 g/dL (ref 30.0–36.0)
MCV: 92.3 fL (ref 80.0–100.0)
MONOS PCT: 6 %
Monocytes Absolute: 0.4 10*3/uL (ref 0.1–1.0)
NEUTROS ABS: 3.9 10*3/uL (ref 1.7–7.7)
NEUTROS PCT: 54 %
Platelets: 235 10*3/uL (ref 150–400)
RBC: 5.05 MIL/uL (ref 3.87–5.11)
RDW: 12.6 % (ref 11.5–15.5)
WBC: 7.1 10*3/uL (ref 4.0–10.5)
nRBC: 0 % (ref 0.0–0.2)

## 2018-08-10 LAB — BRAIN NATRIURETIC PEPTIDE: B NATRIURETIC PEPTIDE 5: 68 pg/mL (ref 0.0–100.0)

## 2018-08-10 LAB — GLUCOSE, CAPILLARY: Glucose-Capillary: 139 mg/dL — ABNORMAL HIGH (ref 70–99)

## 2018-08-10 LAB — D-DIMER, QUANTITATIVE: D-Dimer, Quant: 0.47 ug/mL-FEU (ref 0.00–0.50)

## 2018-08-10 IMAGING — DX DG CHEST 2V
2 series · 2 of 2 positions shown · non-contrast
Comparison: [DATE]

CLINICAL DATA: Chest pain and shortness of breath

EXAM:
CHEST - 2 VIEW

[chest pa]
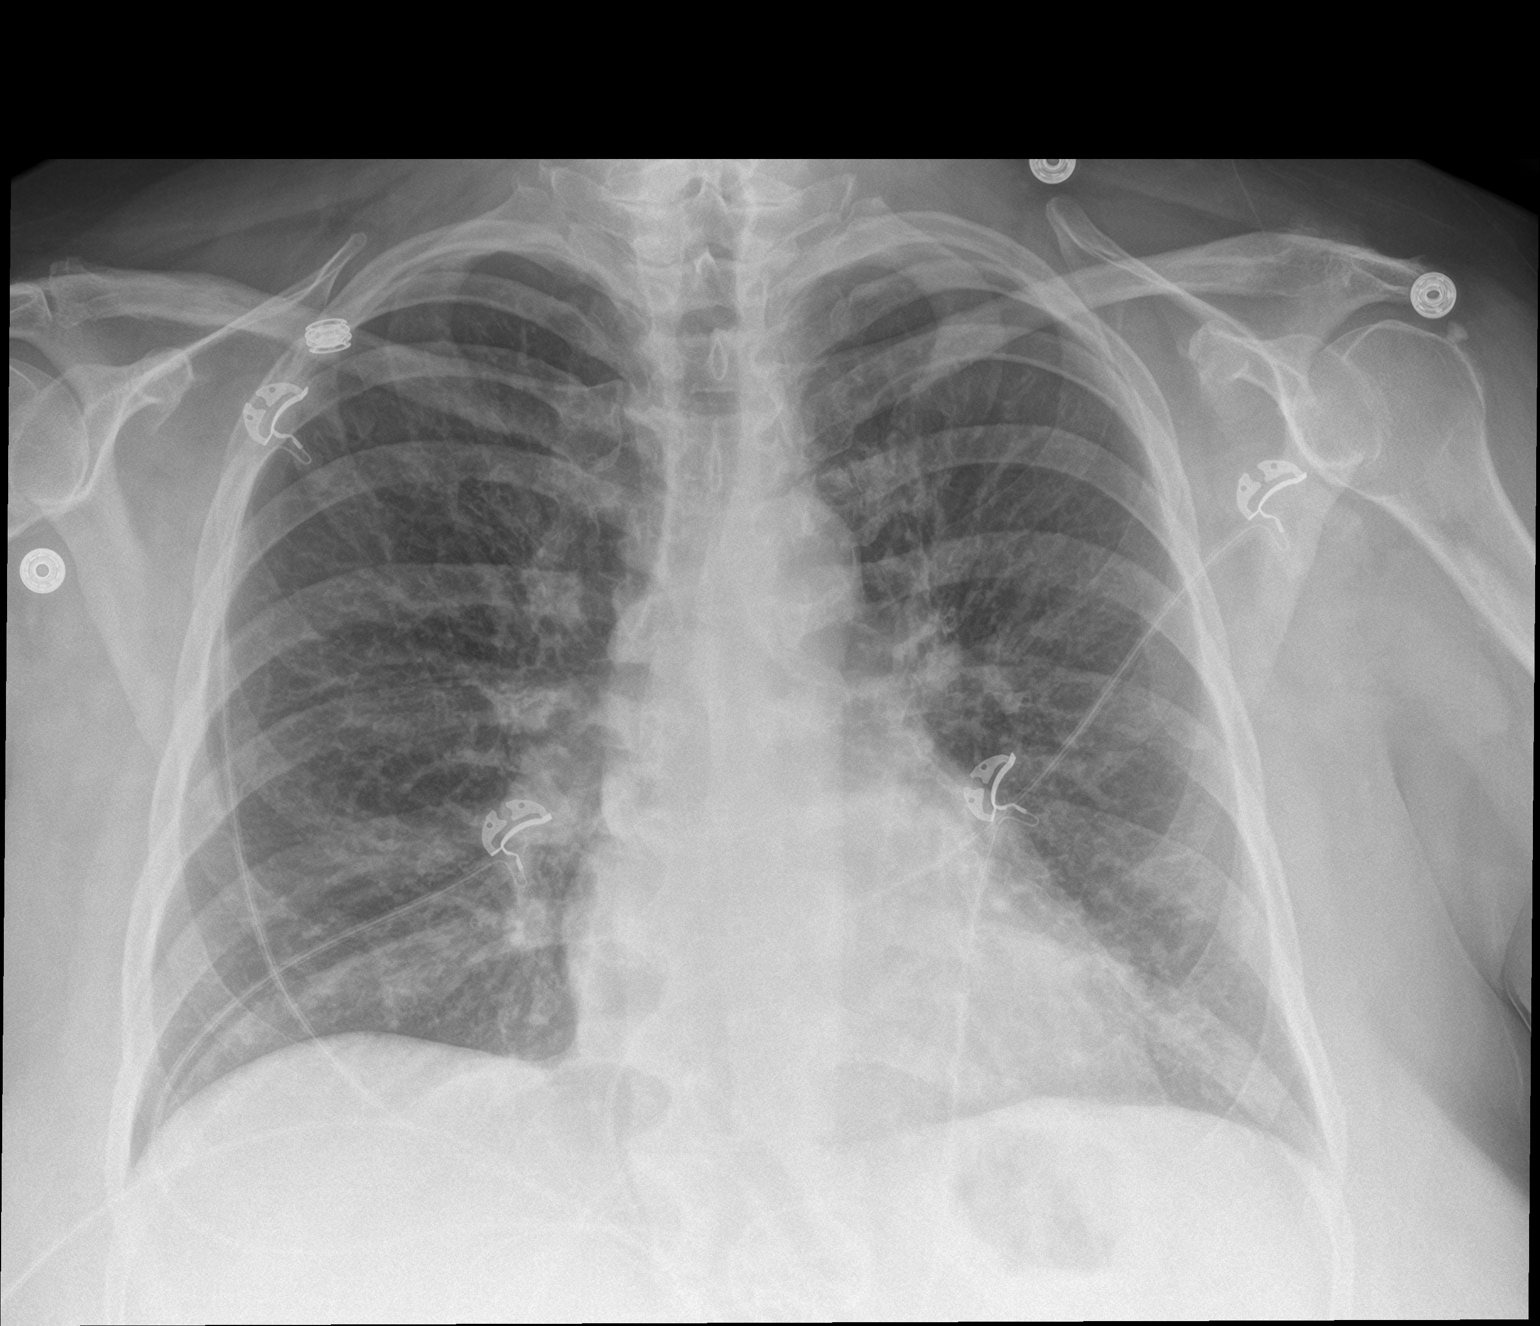

[chest lat]
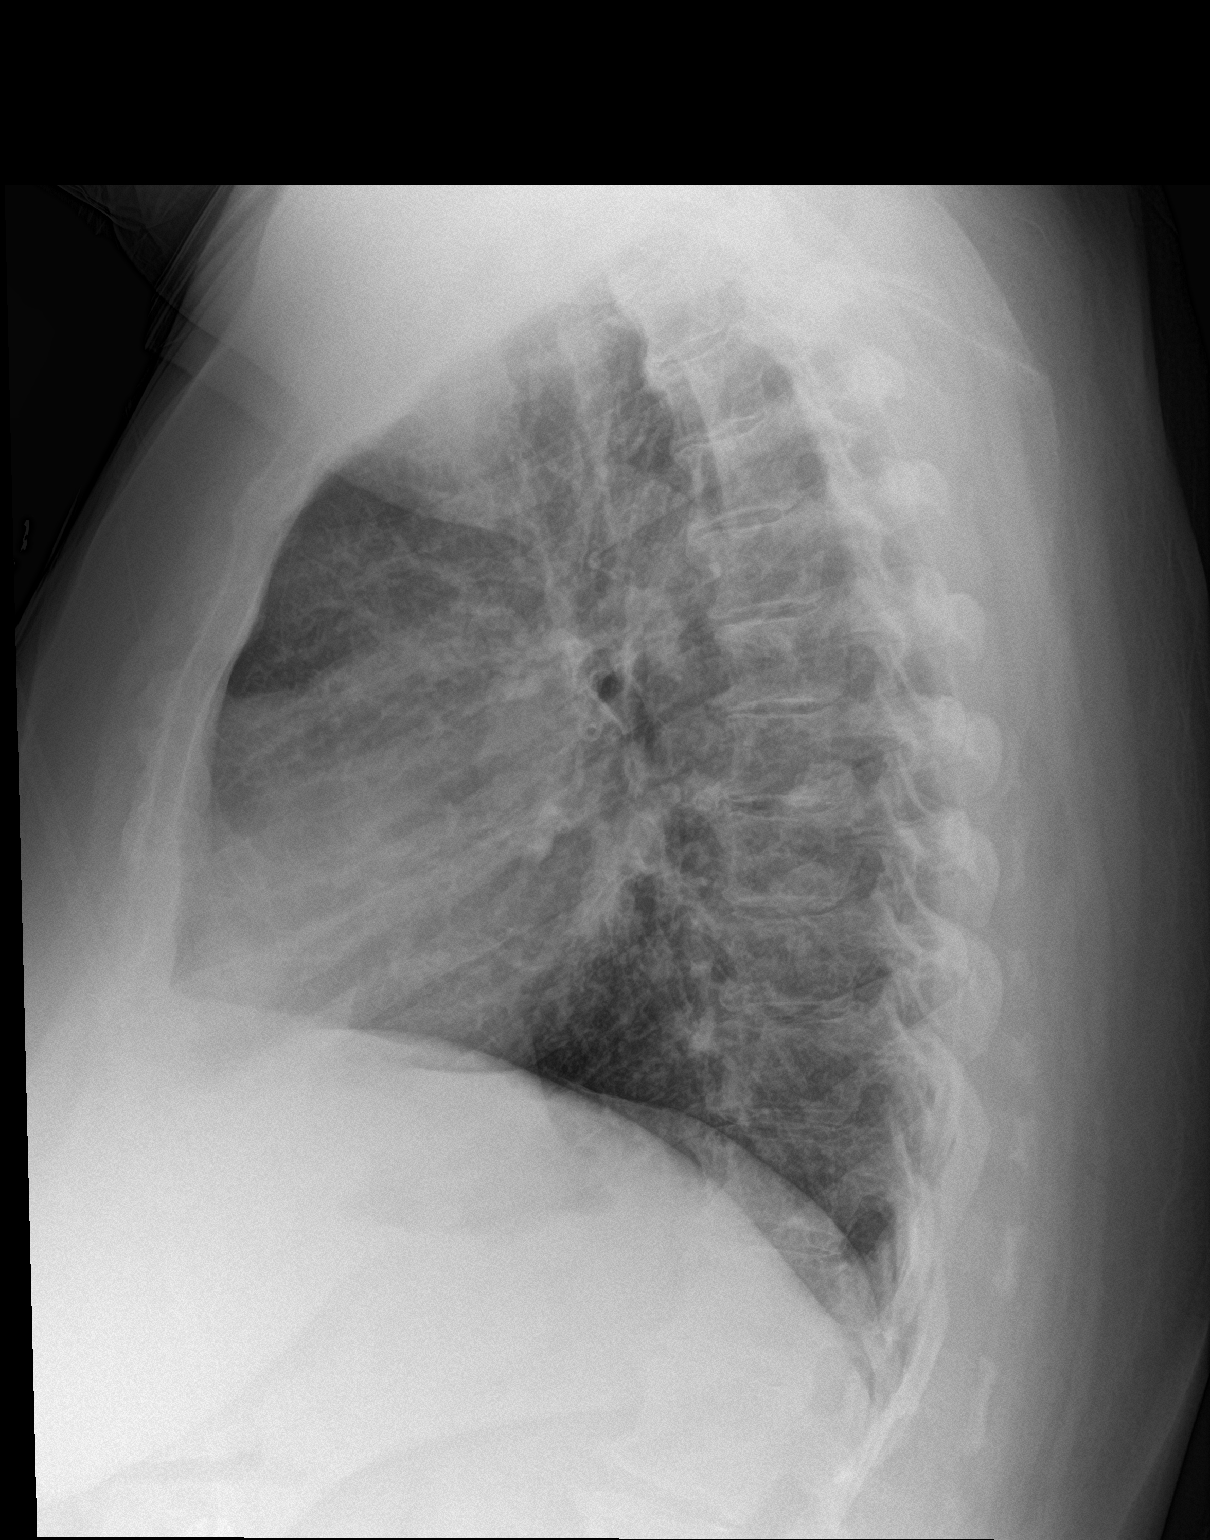

[2 of 2 positions shown; findings below may reference images not displayed]

FINDINGS: There is stable somewhat generalized interstitial thickening. There
is no appreciable edema or consolidation. Heart size and pulmonary
vascularity are normal. No adenopathy. No bone lesions. There is
aortic atherosclerosis.
IMPRESSION: Stable interstitial thickening, a finding likely indicative of
underlying chronic inflammatory type change. There is no frank edema
or consolidation. Cardiac silhouette within normal limits. No
adenopathy. There is aortic atherosclerosis.

Aortic Atherosclerosis ([[place]]-[[place]]).

## 2018-08-10 MED ORDER — HYDRALAZINE HCL 25 MG PO TABS
25.0000 mg | ORAL_TABLET | Freq: Three times a day (TID) | ORAL | Status: DC
  Administered 2018-08-10 – 2018-08-11 (×2): 25 mg via ORAL
  Filled 2018-08-10 (×3): qty 1

## 2018-08-10 MED ORDER — NITROGLYCERIN 0.4 MG SL SUBL
0.4000 mg | SUBLINGUAL_TABLET | SUBLINGUAL | Status: DC | PRN
  Administered 2018-08-10: 0.4 mg via SUBLINGUAL
  Filled 2018-08-10: qty 1

## 2018-08-10 MED ORDER — MELATONIN 3 MG PO TABS: 3.0000 mg | ORAL_TABLET | Freq: Every day | ORAL | Status: DC

## 2018-08-10 MED ORDER — METOCLOPRAMIDE HCL 5 MG/ML IJ SOLN: 10.0000 mg | Freq: Once | INTRAMUSCULAR | Status: DC

## 2018-08-10 MED ORDER — IBUPROFEN 400 MG PO TABS
400.0000 mg | ORAL_TABLET | Freq: Once | ORAL | Status: DC
  Filled 2018-08-10: qty 1

## 2018-08-10 MED ORDER — LEVOTHYROXINE SODIUM 112 MCG PO TABS
112.0000 ug | ORAL_TABLET | Freq: Every day | ORAL | Status: DC
  Administered 2018-08-11: 112 ug via ORAL
  Filled 2018-08-10: qty 1

## 2018-08-10 MED ORDER — APIXABAN 5 MG PO TABS
5.0000 mg | ORAL_TABLET | Freq: Two times a day (BID) | ORAL | Status: DC
  Administered 2018-08-10 – 2018-08-11 (×2): 5 mg via ORAL
  Filled 2018-08-10 (×2): qty 1

## 2018-08-10 MED ORDER — INSULIN GLARGINE 100 UNIT/ML ~~LOC~~ SOLN
10.0000 [IU] | Freq: Every day | SUBCUTANEOUS | Status: DC
  Administered 2018-08-10: 10 [IU] via SUBCUTANEOUS
  Filled 2018-08-10 (×3): qty 0.1

## 2018-08-10 MED ORDER — INSULIN ASPART 100 UNIT/ML ~~LOC~~ SOLN
0.0000 [IU] | Freq: Three times a day (TID) | SUBCUTANEOUS | Status: DC
  Administered 2018-08-11: 2 [IU] via SUBCUTANEOUS
  Administered 2018-08-11: 3 [IU] via SUBCUTANEOUS

## 2018-08-10 MED ORDER — METOCLOPRAMIDE HCL 5 MG/ML IJ SOLN
10.0000 mg | Freq: Once | INTRAMUSCULAR | Status: AC
  Administered 2018-08-10: 10 mg via INTRAVENOUS
  Filled 2018-08-10: qty 2

## 2018-08-10 MED ORDER — DIVALPROEX SODIUM ER 500 MG PO TB24
1500.0000 mg | ORAL_TABLET | Freq: Two times a day (BID) | ORAL | Status: DC
  Administered 2018-08-10 – 2018-08-11 (×2): 1500 mg via ORAL
  Filled 2018-08-10 (×2): qty 3

## 2018-08-10 MED ORDER — DIPHENHYDRAMINE HCL 50 MG/ML IJ SOLN
25.0000 mg | Freq: Once | INTRAMUSCULAR | Status: AC
  Administered 2018-08-10: 25 mg via INTRAVENOUS
  Filled 2018-08-10: qty 1

## 2018-08-10 MED ORDER — LISINOPRIL 10 MG PO TABS
40.0000 mg | ORAL_TABLET | Freq: Every morning | ORAL | Status: DC
  Administered 2018-08-11: 40 mg via ORAL
  Filled 2018-08-10: qty 4

## 2018-08-10 MED ORDER — CHLORHEXIDINE GLUCONATE 0.12 % MT SOLN
15.0000 mL | Freq: Two times a day (BID) | OROMUCOSAL | Status: DC
  Administered 2018-08-10: 15 mL via OROMUCOSAL
  Filled 2018-08-10: qty 15

## 2018-08-10 MED ORDER — ASPIRIN 81 MG PO CHEW
324.0000 mg | CHEWABLE_TABLET | Freq: Once | ORAL | Status: AC
  Administered 2018-08-10: 324 mg via ORAL
  Filled 2018-08-10: qty 4

## 2018-08-10 MED ORDER — ORAL CARE MOUTH RINSE: 15.0000 mL | Freq: Two times a day (BID) | OROMUCOSAL | Status: DC

## 2018-08-10 MED ORDER — LAMOTRIGINE 100 MG PO TABS
50.0000 mg | ORAL_TABLET | Freq: Every day | ORAL | Status: DC
  Administered 2018-08-10: 50 mg via ORAL
  Filled 2018-08-10: qty 1

## 2018-08-10 MED ORDER — IPRATROPIUM-ALBUTEROL 0.5-2.5 (3) MG/3ML IN SOLN
3.0000 mL | Freq: Four times a day (QID) | RESPIRATORY_TRACT | Status: DC | PRN
  Administered 2018-08-10 – 2018-08-11 (×2): 3 mL via RESPIRATORY_TRACT
  Filled 2018-08-10 (×2): qty 3

## 2018-08-10 MED ORDER — GABAPENTIN 300 MG PO CAPS
600.0000 mg | ORAL_CAPSULE | Freq: Three times a day (TID) | ORAL | Status: DC
  Administered 2018-08-10 – 2018-08-11 (×2): 600 mg via ORAL
  Filled 2018-08-10 (×2): qty 2

## 2018-08-10 MED ORDER — ARIPIPRAZOLE 5 MG PO TABS
5.0000 mg | ORAL_TABLET | Freq: Every evening | ORAL | Status: DC
  Administered 2018-08-10: 5 mg via ORAL
  Filled 2018-08-10: qty 1

## 2018-08-10 MED ORDER — TRAMADOL HCL 50 MG PO TABS
50.0000 mg | ORAL_TABLET | Freq: Once | ORAL | Status: AC
  Administered 2018-08-10: 50 mg via ORAL
  Filled 2018-08-10: qty 1

## 2018-08-10 MED ORDER — DILTIAZEM HCL ER COATED BEADS 300 MG PO CP24
300.0000 mg | ORAL_CAPSULE | Freq: Every morning | ORAL | Status: DC
  Administered 2018-08-11: 300 mg via ORAL
  Filled 2018-08-10: qty 1

## 2018-08-10 MED ORDER — ACETAMINOPHEN 500 MG PO TABS: 1000.0000 mg | ORAL_TABLET | Freq: Three times a day (TID) | ORAL | Status: DC | PRN

## 2018-08-10 MED ORDER — ACETAMINOPHEN 500 MG PO TABS
1000.0000 mg | ORAL_TABLET | Freq: Once | ORAL | Status: AC
  Administered 2018-08-10: 1000 mg via ORAL
  Filled 2018-08-10: qty 2

## 2018-08-10 MED ORDER — ATORVASTATIN CALCIUM 10 MG PO TABS
10.0000 mg | ORAL_TABLET | Freq: Every evening | ORAL | Status: DC
  Administered 2018-08-10: 10 mg via ORAL
  Filled 2018-08-10: qty 1

## 2018-08-10 MED ORDER — ALPRAZOLAM 0.5 MG PO TABS
0.5000 mg | ORAL_TABLET | Freq: Every day | ORAL | Status: DC | PRN
  Administered 2018-08-10 – 2018-08-11 (×2): 0.5 mg via ORAL
  Filled 2018-08-10 (×2): qty 1

## 2018-08-10 MED ORDER — HYDROCHLOROTHIAZIDE 25 MG PO TABS
25.0000 mg | ORAL_TABLET | Freq: Every morning | ORAL | Status: DC
  Administered 2018-08-11: 25 mg via ORAL
  Filled 2018-08-10: qty 1

## 2018-08-10 MED ORDER — SODIUM CHLORIDE 0.9 % IV BOLUS
1000.0000 mL | Freq: Once | INTRAVENOUS | Status: AC
  Administered 2018-08-10: 1000 mL via INTRAVENOUS

## 2018-08-10 NOTE — ED Triage Notes (Signed)
Pt states hx of a fib and scheduled for cardioversion nov 20, pt c/o heart racing. States started having sharp pains to chest and a popping sensation to left chest today. Essential tremors noted with hx. Non diapohoretic

## 2018-08-10 NOTE — ED Provider Notes (Signed)
[facility] EMERGENCY DEPARTMENT Provider Note   CSN: [id number] Arrival date & time: 08/10/18  1407     History   Chief Complaint Chief Complaint  Patient presents with  . Chest Pain    HPI Laura [name] is a 57 y.o. female.  HPI  57 year old female with a history of atrial flutter/atrial fibrillation who is on Eliquis presents with acute chest pain and shortness of breath.  She states this occurred around noon while she was shopping.  Exertion seems to worsen the symptoms.  The chest pain is a sharp chest pain.  That has been coming and going.  Every once in a while she will feel a "pop" near her left breast.  This is immediate and then goes away.  Shortness of breath feels different than her typical asthma.  She has also developed a headache that she thinks is from the blood pressure going up, which was a similar problem last month.  Pain is currently about a 6 out of 10.  No lower extremity swelling.  She did get a little sweaty when this started today.  She recently went back into A. fib/flutter with palpitations starting about a month ago.  This is been pretty consistent and continuous.  Saw her cardiologist and she is scheduled for a cardioversion in about a month as she had missed a dose of Eliquis previously. She states she feels "shaky" all over.  Past Medical History:  Diagnosis Date  . Asthma   . Atrial fibrillation ([place])   . Bipolar disorder ([place])   . Bronchiectasis ([place])   . CHF (congestive heart failure) ([place])   . Hypogammaglobulinemia ([place])   . Hypothyroid   . 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)   . Osteoarthritis   . PTSD (post-traumatic stress disorder)   . Recurrent sinusitis   . Tremors of nervous system   . Type 2 diabetes mellitus ([place])   . Wound infection     Patient Active Problem List   Diagnosis Date Noted  . Chest pain 08/10/2018  . Uncontrolled type 2 diabetes mellitus with hyperglycemia ([place]) 03/13/2018  . Hypothyroidism 03/13/2018  . Essential  hypertension, benign 03/13/2018  . Mixed hyperlipidemia 03/13/2018  . Hypogammaglobulinemia ([place]) 02/06/2018  . Non-allergic rhinitis 02/06/2018  . Severe persistent asthma, uncomplicated 60/73/7106  . Pulmonary nodules 02/06/2018  . Bronchiectasis without complication ([place]) 26/94/8546  . DM type 2 causing vascular disease ([place]) 02/06/2018    Past Surgical History:  Procedure Laterality Date  . 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 CARDIAC SURGERY    . [place]  . NASAL SINUS SURGERY  2017     OB History   None      Home Medications    Prior to Admission medications   Medication Sig Start Date End Date Taking? Authorizing Provider  Albuterol Sulfate (PROAIR RESPICLICK) 270 (90 Base) MCG/ACT AEPB Inhale 1 puff into the lungs every 6 (six) hours as needed (for shortness of breath/wheezing).    Yes [provider]  ALPRAZolam Duanne Moron) 0.5 MG tablet Take 0.5 mg by mouth daily as needed for anxiety.  03/06/18  Yes [provider]  apixaban (ELIQUIS) 5 MG TABS tablet Take 5 mg by mouth 2 (two) times daily.   Yes [provider]  ARIPiprazole (ABILIFY) 5 MG tablet Take 5 mg by mouth every evening.  04/16/18  Yes [provider]  atorvastatin (LIPITOR) 10 MG tablet Take 10 mg by mouth every evening.    Yes [provider]  diltiazem (CARDIZEM CD) 300 MG 24 hr capsule Take 300 mg by mouth every morning.    Yes [provider]  divalproex (DEPAKOTE ER) 500 MG 24 hr tablet Take 1,500 mg by mouth 2 (two) times daily.   Yes [provider]  Dulaglutide (TRULICITY) 1.5 TJ/0.3ES SOPN Inject 1.5 mg into the skin every Sunday.    Yes [provider]  furosemide (LASIX) 20 MG tablet Take 20-40 mg by mouth See admin instructions. Take 2 tablets (40mg  total) every morning and 1 tablet (20mg  total)  at noon.   Yes [provider]  gabapentin (NEURONTIN) 600 MG tablet Take 600 mg by mouth 3 (three) times daily.   Yes  [provider]  hydrALAZINE (APRESOLINE) 25 MG tablet Take 25 mg by mouth 3 (three) times daily.   Yes [provider]  hydrochlorothiazide (HYDRODIURIL) 25 MG tablet Take 25 mg by mouth every morning.    Yes [provider]  Immune Globulin, Human, (CUVITRU) 4 GM/20ML SOLN Inject 100 mLs into the skin every 14 (fourteen) days.   Yes [provider]  insulin glargine (LANTUS) 100 UNIT/ML injection Inject 60 Units into the skin at bedtime.   Yes [provider]  lamoTRIgine (LAMICTAL) 100 MG tablet Take 50 mg by mouth at bedtime.    Yes [provider]  levothyroxine (SYNTHROID, LEVOTHROID) 112 MCG tablet Take 112 mcg by mouth daily before breakfast.   Yes [provider]  lisinopril (PRINIVIL,ZESTRIL) 40 MG tablet Take 40 mg by mouth every morning.    Yes [provider]  Melatonin 3 MG TABS Take 3 mg by mouth at bedtime.   Yes [provider]  metFORMIN (GLUCOPHAGE-XR) 500 MG 24 hr tablet Take 1,000 mg by mouth 2 (two) times daily.  04/11/18  Yes [provider]  mometasone-formoterol (DULERA) 200-5 MCG/ACT AERO Inhale 2 puffs into the lungs 2 (two) times daily. 03/01/18  Yes Juanito Doom, MD  tiotropium (SPIRIVA) 18 MCG inhalation capsule Place 18 mcg into inhaler and inhale daily as needed (for breathing).    Yes [provider]  hydrOXYzine (ATARAX/VISTARIL) 25 MG tablet Take 25 mg by mouth 3 (three) times daily as needed for anxiety.     [provider]  Spacer/Aero-Holding Chambers (AEROCHAMBER MV) inhaler Use as instructed 03/01/18   Juanito Doom, MD    Family History Family History  Problem Relation Age of Onset  . Hypertension Mother   . Stroke Mother   . Kidney disease Mother   . Heart attack Mother   . Kidney disease Sister   . Heart disease Brother   . Stroke Maternal Aunt   . Heart disease Maternal Aunt   . Hypertension Sister   . Bipolar disorder Sister   .  Thyroid disease Sister   . Thyroid disease Daughter   . Thyroid disease Daughter   . Hashimoto's thyroiditis Daughter   . Celiac disease Daughter   . Thyroid disease Daughter     Social History Social History   Tobacco Use  . Smoking status: Former Smoker    Packs/day: 1.00    Years: 17.00    Pack years: 17.00    Types: Cigarettes    Last attempt to quit: 2008    Years since quitting: 11.8  . Smokeless tobacco: Never Used  Substance Use Topics  . Alcohol use: Not Currently  . Drug use: Not Currently     Allergies   Ativan [lorazepam] and  Phenergan [promethazine hcl]   Review of Systems Review of Systems  Constitutional: Positive for diaphoresis. Negative for fever.  Respiratory: Positive for shortness of breath.   Cardiovascular: Positive for chest pain and palpitations. Negative for leg swelling.  Gastrointestinal: Negative for vomiting.  Neurological: Positive for headaches.  All other systems reviewed and are negative.    Physical Exam Updated Vital Signs BP (!) 115/58   Pulse 79   Temp 98.5 F (36.9 C) (Oral)   Resp 19   SpO2 96%   Physical Exam  Constitutional: She appears well-developed and well-nourished.  Non-toxic appearance. She does not appear ill. No distress.  HENT:  Head: Normocephalic and atraumatic.  Right Ear: External ear normal.  Left Ear: External ear normal.  Nose: Nose normal.  Eyes: Right eye exhibits no discharge. Left eye exhibits no discharge.  Cardiovascular: Normal rate, regular rhythm and normal heart sounds.  Pulmonary/Chest: Effort normal and breath sounds normal. She exhibits tenderness.    Abdominal: Soft. There is no tenderness.  Musculoskeletal:       Right lower leg: She exhibits no edema.       Left lower leg: She exhibits no edema.  Neurological: She is alert.  Mild tremor noted  Skin: Skin is warm and dry.  Psychiatric: Her mood appears anxious.  Nursing note and vitals reviewed.    ED Treatments /  Results  Labs (all labs ordered are listed, but only abnormal results are displayed) Labs Reviewed  BASIC METABOLIC PANEL - Abnormal; Notable for the following components:      Result Value   Glucose, Bld 107 (*)    All other components within normal limits  CBC WITH DIFFERENTIAL/PLATELET - Abnormal; Notable for the following components:   Hemoglobin 15.9 (*)    HCT 46.6 (*)    All other components within normal limits  BRAIN NATRIURETIC PEPTIDE  TROPONIN I  D-DIMER, QUANTITATIVE (NOT AT [facility])    EKG EKG Interpretation  Date/Time:  Friday August 10 2018 14:14:50 EDT Ventricular Rate:  107 PR Interval:    QRS Duration: 116 QT Interval:  353 QTC Calculation: 471 R Axis:   60 Text Interpretation:  Sinus tachycardia Probable left atrial enlargement Incomplete right bundle branch block Baseline wander in lead(s) II aVF V3 Confirmed by Sherwood Gambler [phone]) on 08/10/2018 2:45:15 PM   EKG Interpretation  Date/Time:  Friday August 10 2018 15:47:38 EDT Ventricular Rate:  92 PR Interval:    QRS Duration: 102 QT Interval:  443 QTC Calculation: 549 R Axis:   49 Text Interpretation:  Sinus or ectopic atrial rhythm Prolonged PR interval Prolonged QT interval Confirmed by Sherwood Gambler [phone]) on 08/10/2018 3:58:23 PM        Radiology Dg Chest 2 View  Result Date: 08/10/2018 CLINICAL DATA:  Chest pain and shortness of breath EXAM: CHEST - 2 VIEW COMPARISON:  June 16, 2018 FINDINGS: There is stable somewhat generalized interstitial thickening. There is no appreciable edema or consolidation. Heart size and pulmonary vascularity are normal. No adenopathy. No bone lesions. There is aortic atherosclerosis. IMPRESSION: Stable interstitial thickening, a finding likely indicative of underlying chronic inflammatory type change. There is no frank edema or consolidation. Cardiac silhouette within normal limits. No adenopathy. There is aortic atherosclerosis. Aortic Atherosclerosis  (ICD10-I70.0). Electronically Signed   By: Lowella Grip III M.D.   On: 08/10/2018 15:17    Procedures Procedures (including critical care time)  Medications Ordered in ED Medications  nitroGLYCERIN (NITROSTAT) SL tablet 0.4 mg (0.4 mg  Sublingual Given 08/10/18 1446)  ibuprofen (ADVIL,MOTRIN) tablet 400 mg (400 mg Oral Not Given 08/10/18 1819)  diphenhydrAMINE (BENADRYL) injection 25 mg (25 mg Intravenous Given 08/10/18 1447)  aspirin chewable tablet 324 mg (324 mg Oral Given 08/10/18 1446)  metoCLOPramide (REGLAN) injection 10 mg (10 mg Intravenous Given 08/10/18 1447)  acetaminophen (TYLENOL) tablet 1,000 mg (1,000 mg Oral Given 08/10/18 1621)  sodium chloride 0.9 % bolus 1,000 mL (0 mLs Intravenous Stopped 08/10/18 1723)     Initial Impression / Assessment and Plan / ED Course  I have reviewed the triage vital signs and the nursing notes.  Pertinent labs & imaging results that were available during my care of the patient were reviewed by me and considered in my medical decision making (see chart for details).     Patient blood pressure has improved with treatment.  Her chest pains pretty atypical but she certainly has risk factors for ACS with her hypertension, diabetes, and obesity.  My suspicion for dissection is pretty low.  Given the tachycardia, d-dimer sent but is also negative.  I think PE is unlikely in the setting.  She is still having some chest pain though she looks more comfortable.  The headache is likely also due to to the hypertension and probably a recurrent issue as well.  My suspicion for head bleed is also low.  Very similar to when she was here in October.  Her heart score is a 4, will admit for ACS rule out.  Final Clinical Impressions(s) / ED Diagnoses   Final diagnoses:  Atypical chest pain    ED Discharge Orders    None       Sherwood Gambler, MD 08/10/18 1820

## 2018-08-10 NOTE — Progress Notes (Signed)
ANTICOAGULATION CONSULT NOTE - Initial Consult  Pharmacy Consult for eliquis Indication: atrial fibrillation  Allergies  Allergen Reactions  . Ativan [Lorazepam] Hives  . Phenergan [Promethazine Hcl] Hives    Patient Measurements: Height: 5\' 4"  (162.6 cm) Weight: 240 lb 4.8 oz (109 kg) IBW/kg (Calculated) : 54.7   Vital Signs: Temp: 98.5 F (36.9 C) (11/01 1415) Temp Source: Oral (11/01 1415) BP: 129/74 (11/01 1932) Pulse Rate: 90 (11/01 1932)  Labs: Recent Labs    08/10/18 1420  HGB 15.9*  HCT 46.6*  PLT 235  CREATININE 0.58  TROPONINI <0.03    Estimated Creatinine Clearance: 94.7 mL/min (by C-G formula based on SCr of 0.58 mg/dL).   Medical History: Past Medical History:  Diagnosis Date  . Asthma   . Atrial fibrillation ([place])   . Bipolar disorder ([place])   . Bronchiectasis ([place])   . CHF (congestive heart failure) ([place])   . Hypogammaglobulinemia ([place])   . Hypothyroid   . 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 Osteoarthritis   . PTSD (post-traumatic stress disorder)   . Recurrent sinusitis   . Tremors of nervous system   . Type 2 diabetes mellitus ([place])   . Wound infection     Medications:  Medications Prior to Admission  Medication Sig Dispense Refill Last Dose  . Albuterol Sulfate (PROAIR RESPICLICK) 425 (90 Base) MCG/ACT AEPB Inhale 1 puff into the lungs every 6 (six) hours as needed (for shortness of breath/wheezing).    08/09/2018 at Unknown time  . ALPRAZolam (XANAX) 0.5 MG tablet Take 0.5 mg by mouth daily as needed for anxiety.   0 Past Month at Unknown time  . apixaban (ELIQUIS) 5 MG TABS tablet Take 5 mg by mouth 2 (two) times daily.   08/10/2018 at 930a  . ARIPiprazole (ABILIFY) 5 MG tablet Take 5 mg by mouth every evening.   0 08/09/2018 at Unknown time  . atorvastatin (LIPITOR) 10 MG tablet Take 10 mg by mouth every evening.    08/09/2018 at Unknown time  . diltiazem (CARDIZEM CD) 300 MG 24 hr capsule Take 300 mg by mouth every morning.    08/10/2018 at  Unknown time  . divalproex (DEPAKOTE ER) 500 MG 24 hr tablet Take 1,500 mg by mouth 2 (two) times daily.   08/10/2018 at 930a  . Dulaglutide (TRULICITY) 1.5 ZD/6.3OV SOPN Inject 1.5 mg into the skin every Sunday.    08/05/2018 at Unknown time  . furosemide (LASIX) 20 MG tablet Take 20-40 mg by mouth See admin instructions. Take 2 tablets (40mg  total) every morning and 1 tablet (20mg  total)  at noon.   08/10/2018 at 930a  . gabapentin (NEURONTIN) 600 MG tablet Take 600 mg by mouth 3 (three) times daily.   08/10/2018 at Unknown time  . hydrALAZINE (APRESOLINE) 25 MG tablet Take 25 mg by mouth 3 (three) times daily.   08/10/2018 at Unknown time  . hydrochlorothiazide (HYDRODIURIL) 25 MG tablet Take 25 mg by mouth every morning.    08/10/2018 at Unknown time  . Immune Globulin, Human, (CUVITRU) 4 GM/20ML SOLN Inject 100 mLs into the skin every 14 (fourteen) days.   08/08/2018 at Unknown time  . insulin glargine (LANTUS) 100 UNIT/ML injection Inject 60 Units into the skin at bedtime.   08/09/2018 at Unknown time  . lamoTRIgine (LAMICTAL) 100 MG tablet Take 50 mg by mouth at bedtime.    08/09/2018 at Unknown time  . levothyroxine (SYNTHROID, LEVOTHROID) 112 MCG tablet Take 112 mcg by mouth daily before  breakfast.   08/10/2018 at Unknown time  . lisinopril (PRINIVIL,ZESTRIL) 40 MG tablet Take 40 mg by mouth every morning.    08/10/2018 at Unknown time  . Melatonin 3 MG TABS Take 3 mg by mouth at bedtime.   08/10/2018 at Unknown time  . metFORMIN (GLUCOPHAGE-XR) 500 MG 24 hr tablet Take 1,000 mg by mouth 2 (two) times daily.   0 08/10/2018 at Unknown time  . mometasone-formoterol (DULERA) 200-5 MCG/ACT AERO Inhale 2 puffs into the lungs 2 (two) times daily. 1 Inhaler 0 08/10/2018 at Unknown time  . tiotropium (SPIRIVA) 18 MCG inhalation capsule Place 18 mcg into inhaler and inhale daily as needed (for breathing).    >30 days  . hydrOXYzine (ATARAX/VISTARIL) 25 MG tablet Take 25 mg by mouth 3 (three) times daily as  needed for anxiety.    Not Taking at Unknown time  . Spacer/Aero-Holding Chambers (AEROCHAMBER MV) inhaler Use as instructed 1 each 0 Taking    Assessment: 57 yo female presented to the ED with chest pain. She has a medical history significant of A. Fib/atrial flutter currently on Eliquis, history of ASD repair at the age of 88, insulin-dependent type 2 diabetes, hypertension, severe persistent asthma, OSA, bipolar disorder .   Goal of Therapy:  Monitor platelets by anticoagulation protocol: Yes   Plan:  Continue eliquis 5mg  po BID Monitor for S/S of bleeding  Isac Sarna, BS Vena [place], BCPS Clinical Pharmacist Pager [phone] 08/10/2018,8:16 PM

## 2018-08-10 NOTE — H&P (Signed)
History and Physical    Laura [name] GNF:[id number] DOB: 1961/07/21 DOA: 08/10/2018  PCP: [name], MD Patient coming from: Home  Chief Complaint: Chest pain  HPI: Laura [name] is a 57 y.o. female with medical history significant of A. Fib/atrial flutter currently on Eliquis, history of ASD repair at the age of 56, insulin-dependent type 2 diabetes, hypertension, severe persistent asthma, OSA, bipolar disorder presenting to the hospital for evaluation of chest pain.  Patient states she has been getting heart palpitations for the past 1-1/2 months but symptoms have been worse today.  States she experience left-sided chest heaviness, 6 out of 10 intensity with exertion around 10 AM this morning and chest pain has been constant since then.  It is associated with shortness of breath, feeling hot, and dizziness.  Chest heaviness is not associated with nausea or diaphoresis.  No associated wheezing.  States her chest heaviness is better since she has been in the ED but not completely resolved; she is not sure if sublingual nitroglycerin caused any relief.  It did cause her to have a headache which improved with IV Benadryl and IV Metoclopramide in the ED.  States Tylenol does not help.  She is not able to tolerate NSAIDs.  She is having a headache again and is requesting more medication.   ED Course: Afebrile.  Initially slightly tachycardic but subsequent pulse readings normal.  Blood pressure 172/110 on arrival.  [place] well on room air.  I-STAT troponin negative.  EKG not suggestive of ACS.  No leukocytosis.  No anemia.  BNP normal.  D-dimer 0.47.  Chest x-ray showing stable interstitial thickening indicative of underlying chronic inflammatory type change; no frank consolidation or edema.  Patient received 1 L normal saline bolus, aspirin, Tylenol, IV Benadryl, and IV metoclopramide in the ED. [facility] paged to admit.  Review of Systems: As per HPI otherwise 10 point review of systems negative.  Past  Medical History:  Diagnosis Date  . Asthma   . Atrial fibrillation ([place])   . Bipolar disorder ([place])   . Bronchiectasis ([place])   . CHF (congestive heart failure) ([place])   . Hypogammaglobulinemia ([place])   . Hypothyroid   . 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 Osteoarthritis   . PTSD (post-traumatic stress disorder)   . Recurrent sinusitis   . Tremors of nervous system   . Type 2 diabetes mellitus ([place])   . Wound infection     Past Surgical History:  Procedure Laterality Date  . 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 CARDIAC SURGERY    . [place]  . NASAL SINUS SURGERY  2017     reports that she quit smoking about 11 years ago. Her smoking use included cigarettes. She has a 17.00 pack-year smoking history. She has never used smokeless tobacco. She reports that she drank alcohol. She reports that she has current or past drug history.  Allergies  Allergen Reactions  . Ativan [Lorazepam] Hives  . Phenergan [Promethazine Hcl] Hives    Family History  Problem Relation Age of Onset  . Hypertension Mother   . Stroke Mother   . Kidney disease Mother   . Heart attack Mother   . Kidney disease Sister   . Heart disease Brother   . Stroke Maternal Aunt   . Heart disease Maternal Aunt   . Hypertension Sister   . Bipolar disorder Sister   . Thyroid disease Sister   . Thyroid disease Daughter   . Thyroid disease  Daughter   . Hashimoto's thyroiditis Daughter   . Celiac disease Daughter   . Thyroid disease Daughter     Prior to Admission medications   Medication Sig Start Date End Date Taking? Authorizing Provider  Albuterol Sulfate (PROAIR RESPICLICK) 170 (90 Base) MCG/ACT AEPB Inhale 1 puff into the lungs every 6 (six) hours as needed (for shortness of breath/wheezing).    Yes [provider]  ALPRAZolam Duanne Moron) 0.5 MG tablet Take 0.5 mg by mouth daily as needed for anxiety.  03/06/18  Yes [provider]  apixaban (ELIQUIS) 5 MG TABS tablet Take 5 mg by mouth 2 (two)  times daily.   Yes [provider]  ARIPiprazole (ABILIFY) 5 MG tablet Take 5 mg by mouth every evening.  04/16/18  Yes [provider]  atorvastatin (LIPITOR) 10 MG tablet Take 10 mg by mouth every evening.    Yes [provider]  diltiazem (CARDIZEM CD) 300 MG 24 hr capsule Take 300 mg by mouth every morning.    Yes [provider]  divalproex (DEPAKOTE ER) 500 MG 24 hr tablet Take 1,500 mg by mouth 2 (two) times daily.   Yes [provider]  Dulaglutide (TRULICITY) 1.5 YF/7.4BS SOPN Inject 1.5 mg into the skin every Sunday.    Yes [provider]  furosemide (LASIX) 20 MG tablet Take 20-40 mg by mouth See admin instructions. Take 2 tablets (40mg  total) every morning and 1 tablet (20mg  total)  at noon.   Yes [provider]  gabapentin (NEURONTIN) 600 MG tablet Take 600 mg by mouth 3 (three) times daily.   Yes [provider]  hydrALAZINE (APRESOLINE) 25 MG tablet Take 25 mg by mouth 3 (three) times daily.   Yes [provider]  hydrochlorothiazide (HYDRODIURIL) 25 MG tablet Take 25 mg by mouth every morning.    Yes [provider]  Immune Globulin, Human, (CUVITRU) 4 GM/20ML SOLN Inject 100 mLs into the skin every 14 (fourteen) days.   Yes [provider]  insulin glargine (LANTUS) 100 UNIT/ML injection Inject 60 Units into the skin at bedtime.   Yes [provider]  lamoTRIgine (LAMICTAL) 100 MG tablet Take 50 mg by mouth at bedtime.    Yes [provider]  levothyroxine (SYNTHROID, LEVOTHROID) 112 MCG tablet Take 112 mcg by mouth daily before breakfast.   Yes [provider]  lisinopril (PRINIVIL,ZESTRIL) 40 MG tablet Take 40 mg by mouth every morning.    Yes [provider]  Melatonin 3 MG TABS Take 3 mg by mouth at bedtime.   Yes [provider]  metFORMIN (GLUCOPHAGE-XR) 500 MG 24 hr tablet Take 1,000 mg by mouth 2 (two) times daily.  04/11/18  Yes  [provider]  mometasone-formoterol (DULERA) 200-5 MCG/ACT AERO Inhale 2 puffs into the lungs 2 (two) times daily. 03/01/18  Yes Juanito Doom, MD  tiotropium (SPIRIVA) 18 MCG inhalation capsule Place 18 mcg into inhaler and inhale daily as needed (for breathing).    Yes [provider]  hydrOXYzine (ATARAX/VISTARIL) 25 MG tablet Take 25 mg by mouth 3 (three) times daily as needed for anxiety.     [provider]  Spacer/Aero-Holding Chambers (AEROCHAMBER MV) inhaler Use as instructed 03/01/18   Juanito Doom, MD    Physical Exam: Vitals:   08/10/18 1731 08/10/18 1800 08/10/18 1830 08/10/18 1932  BP: (!) 115/58 (!) 134/102 (!) 107/44 129/74  Pulse: 79 79 87 90  Resp:    16  Temp:      TempSrc:      SpO2: 96% 97% 95% 100%  Weight:    109 kg  Height:    5\' 4"  (1.626 m)   Physical Exam  Constitutional: She is oriented to person, place, and time. She appears well-developed and well-nourished. No distress.  Resting comfortably in a hospital stretcher  HENT:  Head: Normocephalic and atraumatic.  Mouth/Throat: Oropharynx is clear and moist.  Eyes: Pupils are equal, round, and reactive to light. EOM are normal.  Neck: Neck supple. No tracheal deviation present.  Cardiovascular: Normal rate, regular rhythm and intact distal pulses.  Pulmonary/Chest: Effort normal. No respiratory distress. She has no rales.  Very mild scattered end expiratory wheezing  Abdominal: Soft. Bowel sounds are normal. She exhibits no distension. There is no tenderness.  Musculoskeletal: She exhibits no edema.  Neurological: She is alert and oriented to person, place, and time.  Strength 5 out of 5 in bilateral upper and lower extremities.  Sensation to light touch intact throughout.  Skin: Skin is warm and dry. She is not diaphoretic.     Labs on Admission: I have personally reviewed following labs and imaging studies  CBC: Recent Labs  Lab 08/10/18 1420  WBC 7.1    NEUTROABS 3.9  HGB 15.9*  HCT 46.6*  MCV 92.3  PLT 606   Basic Metabolic Panel: Recent Labs  Lab 08/10/18 1420  NA 139  K 4.0  CL 101  CO2 26  GLUCOSE 107*  BUN 17  CREATININE 0.58  CALCIUM 10.2   GFR: Estimated Creatinine Clearance: 94.7 mL/min (by C-G formula based on SCr of 0.58 mg/dL). Liver Function Tests: No results for input(s): AST, ALT, ALKPHOS, BILITOT, PROT, ALBUMIN in the last 168 hours. No results for input(s): LIPASE, AMYLASE in the last 168 hours. No results for input(s): AMMONIA in the last 168 hours. Coagulation Profile: No results for input(s): INR, PROTIME in the last 168 hours. Cardiac Enzymes: Recent Labs  Lab 08/10/18 1420  TROPONINI <0.03   BNP (last 3 results) No results for input(s): PROBNP in the last 8760 hours. HbA1C: No results for input(s): HGBA1C in the last 72 hours. CBG: No results for input(s): GLUCAP in the last 168 hours. Lipid Profile: No results for input(s): CHOL, HDL, LDLCALC, TRIG, CHOLHDL, LDLDIRECT in the last 72 hours. Thyroid Function Tests: No results for input(s): TSH, T4TOTAL, FREET4, T3FREE, THYROIDAB in the last 72 hours. Anemia Panel: No results for input(s): VITAMINB12, FOLATE, FERRITIN, TIBC, IRON, RETICCTPCT in the last 72 hours. Urine analysis:    Component Value Date/Time   COLORURINE STRAW (A) 06/16/2018 1543   APPEARANCEUR CLEAR 06/16/2018 1543   LABSPEC 1.022 06/16/2018 1543   PHURINE 6.0 06/16/2018 1543   GLUCOSEU >=500 (A) 06/16/2018 1543   HGBUR SMALL (A) 06/16/2018 1543   BILIRUBINUR NEGATIVE 06/16/2018 1543   KETONESUR NEGATIVE 06/16/2018 1543   PROTEINUR NEGATIVE 06/16/2018 1543   NITRITE NEGATIVE 06/16/2018 1543   LEUKOCYTESUR NEGATIVE 06/16/2018 1543    Radiological Exams on Admission: Dg Chest 2 View  Result Date: 08/10/2018 CLINICAL DATA:  Chest pain and shortness of breath EXAM: CHEST - 2 VIEW COMPARISON:  June 16, 2018 FINDINGS: There is stable somewhat generalized interstitial  thickening. There is no appreciable edema or consolidation. Heart size and pulmonary vascularity are normal. No adenopathy. No bone lesions. There is aortic atherosclerosis. IMPRESSION: Stable interstitial thickening, a finding likely indicative of underlying chronic inflammatory type change. There is no frank edema or consolidation. Cardiac  silhouette within normal limits. No adenopathy. There is aortic atherosclerosis. Aortic Atherosclerosis (ICD10-I70.0). Electronically Signed   By: Lowella Grip III M.D.   On: 08/10/2018 15:17    EKG: Independently reviewed.  Sinus or ectopic atrial rhythm (heart rate 92), PR prolongation, and QTC prolongation (549).  Assessment/Plan Principal Problem:   Atypical chest pain Active Problems:   Severe persistent asthma, uncomplicated   Uncontrolled type 2 diabetes mellitus with hyperglycemia (HCC)   Hypothyroidism   Essential hypertension, benign   Mixed hyperlipidemia   Bipolar disorder ([place])   Headache   H/O atrial flutter   OSA on CPAP   Anxiety   Atypical chest pain Risk factors for coronary artery disease include hypertension, diabetes, obesity, and family history.  Patient reports having constant chest pain since 10 AM this morning. I-STAT troponin negative and EKG not suggestive of ACS. PE less likely as patient is currently not tachycardic and is not hypoxic.  D-dimer normal.  Received aspirin in the ED.  She appears comfortable on exam and is in no acute distress. -Monitor on telemetry -Continue to trend troponin -Sublingual nitroglycerin as needed for chest pain -Echocardiogram  HTN Blood pressure 172/110 on arrival.  Currently normotensive. -Continue home medications diltiazem CD, hydralazine, hydrochlorothiazide, lisinopril. -Hold home Lasix at this time.  Does not appear volume overloaded on exam.  Headache Started after patient received sublingual nitroglycerin in the ED. Appears comfortable on exam.  Neuro exam nonfocal.  Patient  reports relief with IV Benadryl and IV Metoclopramide. -IV Benadryl once. Will avoid IV metoclopramide due to risk of serotonin syndrome with abilify and lamotrigine. Will avoid IV Compazine as she is allergic to phenergan which is from the same drug class.  -Tramadol 50 mg once  Persistent atrial flutter/history of atrial fibrillation Seen by cardiology 2 days ago.  Underwent successful DCCV for atrial fibrillation 10+ years ago and has maintained normal sinus rhythm since until recently diagnosed with atrial flutter last month. Noted to be in atrial flutter during this visit as well.  Plan is for cardiology to perform a DCCV following 3 weeks of uninterrupted anticoagulation. CHA2DS2VASc at least 3. EKG showing sinus or ectopic atrial rhythm (heart rate 92). -Echocardiogram -Continue home Eliquis for anticoagulation -Continue Cardizem CD for rate control  Insulin-dependent type 2 diabetes Blood glucose 107 on admission. -Check A1c -Lantus 10 units at bedtime -Sliding scale insulin moderate -CBG checks  Hyperlipidemia -Continue home Lipitor 10 mg daily -Check lipid panel  Severe persistent asthma Very mild scattered and expiratory wheezing appreciated on exam. -Duo nebs every 6 hours as needed  Hypothyroidism -Continue home Synthroid  OSA -CPAP at night  Bipolar disorder/anxiety -Continue home Abilify, lamotrigine, Depakote -Continue home Xanax prn  DVT prophylaxis: Eliquis Code Status: Patient wishes to be full code. Family Communication: No family present at bedside. Disposition Plan: Anticipate discharge to home in 1 to 2 days. Consults called: None.   Admission status: Observation   Shela Leff MD [facility] Pager [phone]  If 7PM-7AM, please contact night-coverage [email or web address] Password [facility]  08/10/2018, 8:18 PM

## 2018-08-10 NOTE — Telephone Encounter (Signed)
Pt called and reports that her heart is racing. Pt sounds SOB on phone. Reports BP 160/136 with HR of 186. Pt instructed to be seen in ED for current symptoms. Pt voiced understanding.

## 2018-08-10 NOTE — ED Notes (Signed)
Pt requesting something for a headache and something to eat. [place] hospitalist paged.

## 2018-08-10 NOTE — ED Notes (Signed)
ED TO INPATIENT HANDOFF REPORT  Name/Age/Gender Laura [name] 57 y.o. female  Code Status   Home/SNF/Other Home  Chief Complaint afib  Level of Care/Admitting Diagnosis ED Disposition    ED Disposition Condition [place]: [facility] [[id number]]  Level of Care: Telemetry [5]  Diagnosis: Chest pain [673419]  Admitting Physician: Shela Leff [[id number]]  Attending Physician: Shela Leff [[id number]]  PT Class (Do Not Modify): Observation [104]  PT Acc Code (Do Not Modify): Observation [10022]       Medical History Past Medical History:  Diagnosis Date  . Asthma   . Atrial fibrillation ([place])   . Bipolar disorder ([place])   . Bronchiectasis ([place])   . CHF (congestive heart failure) ([place])   . Hypogammaglobulinemia ([place])   . Hypothyroid   . 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 Osteoarthritis   . PTSD (post-traumatic stress disorder)   . Recurrent sinusitis   . Tremors of nervous system   . Type 2 diabetes mellitus ([place])   . Wound infection     Allergies Allergies  Allergen Reactions  . Ativan [Lorazepam] Hives  . Phenergan [Promethazine Hcl] Hives    IV Location/Drains/Wounds Patient Lines/Drains/Airways Status   Active Line/Drains/Airways    Name:   Placement date:   Placement time:   Site:   Days:   Peripheral IV 08/10/18 Right Antecubital   08/10/18    1421    Antecubital   less than 1          Labs/Imaging Results for orders placed or performed during the hospital encounter of 08/10/18 (from the past 48 hour(s))  Basic metabolic panel     Status: Abnormal   Collection Time: 08/10/18  2:20 PM  Result Value Ref Range   Sodium 139 135 - 145 mmol/L   Potassium 4.0 3.5 - 5.1 mmol/L   Chloride 101 98 - 111 mmol/L   CO2 26 22 - 32 mmol/L   Glucose, Bld 107 (H) 70 - 99 mg/dL   BUN 17 6 - 20 mg/dL   Creatinine, Ser 0.58 0.44 - 1.00 mg/dL   Calcium 10.2 8.9 - 10.3 mg/dL   GFR calc non Af Amer >60 >60 mL/min   GFR calc Af Amer  >60 >60 mL/min    Comment: (NOTE) The eGFR has been calculated using the CKD EPI equation. This calculation has not been validated in all clinical situations. eGFR's persistently <60 mL/min signify possible Chronic Kidney Disease.    Anion gap 12 5 - 15    Comment: Performed at [facility], [address] [place], [place] [address]  Brain natriuretic peptide     Status: None   Collection Time: 08/10/18  2:20 PM  Result Value Ref Range   B Natriuretic Peptide 68.0 0.0 - 100.0 pg/mL    Comment: Performed at [facility], [address], [place], [place] [address]  Troponin I     Status: None   Collection Time: 08/10/18  2:20 PM  Result Value Ref Range   Troponin I <0.03 <0.03 ng/mL    Comment: Performed at [facility], [address] [place], [place] [address]  CBC with Differential     Status: Abnormal   Collection Time: 08/10/18  2:20 PM  Result Value Ref Range   WBC 7.1 4.0 - 10.5 K/uL   RBC 5.05 3.87 - 5.11 MIL/uL   Hemoglobin 15.9 (H) 12.0 - 15.0 g/dL   HCT 46.6 (H) 36.0 - 46.0 %   MCV  92.3 80.0 - 100.0 fL   MCH 31.5 26.0 - 34.0 pg   MCHC 34.1 30.0 - 36.0 g/dL   RDW 12.6 11.5 - 15.5 %   Platelets 235 150 - 400 K/uL   nRBC 0.0 0.0 - 0.2 %   Neutrophils Relative % 54 %   Neutro Abs 3.9 1.7 - 7.7 K/uL   Lymphocytes Relative 38 %   Lymphs Abs 2.7 0.7 - 4.0 K/uL   Monocytes Relative 6 %   Monocytes Absolute 0.4 0.1 - 1.0 K/uL   Eosinophils Relative 1 %   Eosinophils Absolute 0.1 0.0 - 0.5 K/uL   Basophils Relative 1 %   Basophils Absolute 0.0 0.0 - 0.1 K/uL   Immature Granulocytes 0 %   Abs Immature Granulocytes 0.03 0.00 - 0.07 K/uL    Comment: Performed at [facility], [address], [place], [place] [address]  D-dimer, quantitative     Status: None   Collection Time: 08/10/18  2:20 PM  Result Value Ref Range   D-Dimer, Quant 0.47 0.00 - 0.50 ug/mL-FEU    Comment: (NOTE) At the manufacturer cut-off of 0.50 ug/mL FEU, this assay has been documented to exclude PE  with a sensitivity and negative predictive value of 97 to 99%.  At this time, this assay has not been approved by the FDA to exclude DVT/VTE. Results should be correlated with clinical presentation. Performed at [facility], [address] [place], [place] [address]    Dg Chest 2 View  Result Date: 08/10/2018 CLINICAL DATA:  Chest pain and shortness of breath EXAM: CHEST - 2 VIEW COMPARISON:  June 16, 2018 FINDINGS: There is stable somewhat generalized interstitial thickening. There is no appreciable edema or consolidation. Heart size and pulmonary vascularity are normal. No adenopathy. No bone lesions. There is aortic atherosclerosis. IMPRESSION: Stable interstitial thickening, a finding likely indicative of underlying chronic inflammatory type change. There is no frank edema or consolidation. Cardiac silhouette within normal limits. No adenopathy. There is aortic atherosclerosis. Aortic Atherosclerosis (ICD10-I70.0). Electronically Signed   By: Lowella Grip III M.D.   On: 08/10/2018 15:17    Pending Labs Unresulted Labs (From admission, onward)   None      Vitals/Pain Today's Vitals   08/10/18 1530 08/10/18 1600 08/10/18 1630 08/10/18 1700  BP: (!) 125/102 (!) 104/57 (!) 113/57 (!) 115/54  Pulse: (!) 102 92 90 80  Resp: _0 Temp:      TempSrc:      SpO2: 94% 94% 93% 94%  PainSc:        Isolation Precautions No active isolations  Medications Medications  nitroGLYCERIN (NITROSTAT) SL tablet 0.4 mg (0.4 mg Sublingual Given 08/10/18 1446)  sodium chloride 0.9 % bolus 1,000 mL (1,000 mLs Intravenous New Bag/Given 08/10/18 1622)  diphenhydrAMINE (BENADRYL) injection 25 mg (25 mg Intravenous Given 08/10/18 1447)  aspirin chewable tablet 324 mg (324 mg Oral Given 08/10/18 1446)  metoCLOPramide (REGLAN) injection 10 mg (10 mg Intravenous Given 08/10/18 1447)  acetaminophen (TYLENOL) tablet 1,000 mg (1,000 mg Oral Given 08/10/18 1621)    Mobility walks

## 2018-08-11 ENCOUNTER — Observation Stay (HOSPITAL_BASED_OUTPATIENT_CLINIC_OR_DEPARTMENT_OTHER): Payer: Medicare Other

## 2018-08-11 ENCOUNTER — Other Ambulatory Visit (HOSPITAL_COMMUNITY): Payer: Self-pay | Admitting: *Deleted

## 2018-08-11 DIAGNOSIS — R079 Chest pain, unspecified: Secondary | ICD-10-CM

## 2018-08-11 DIAGNOSIS — R0789 Other chest pain: Secondary | ICD-10-CM | POA: Diagnosis not present

## 2018-08-11 LAB — TROPONIN I
Troponin I: <0.03 ng/mL (ref <0.03)
Troponin I: <0.03 ng/mL (ref <0.03)

## 2018-08-11 LAB — LIPID PANEL
Cholesterol: 111 mg/dL (ref 0–200)
HDL: 32 mg/dL — ABNORMAL LOW (ref >40)
LDL Cholesterol: 46 mg/dL (ref 0–99)
Total CHOL/HDL Ratio: 3.5 RATIO
Triglycerides: 165 mg/dL — ABNORMAL HIGH (ref <150)
VLDL: 33 mg/dL (ref 0–40)

## 2018-08-11 LAB — ECHOCARDIOGRAM COMPLETE
HEIGHTINCHES: 64 in
WEIGHTICAEL: 3880.1 [oz_av]

## 2018-08-11 LAB — GLUCOSE, CAPILLARY
Glucose-Capillary: 131 mg/dL — ABNORMAL HIGH (ref 70–99)
Glucose-Capillary: 152 mg/dL — ABNORMAL HIGH (ref 70–99)

## 2018-08-11 MED ORDER — ONDANSETRON HCL 4 MG/2ML IJ SOLN
4.0000 mg | Freq: Once | INTRAMUSCULAR | Status: AC
  Administered 2018-08-11: 4 mg via INTRAVENOUS
  Filled 2018-08-11: qty 2

## 2018-08-11 MED ORDER — PERFLUTREN LIPID MICROSPHERE
1.0000 mL | INTRAVENOUS | Status: AC | PRN
  Administered 2018-08-11: 2 mL via INTRAVENOUS
  Filled 2018-08-11: qty 10

## 2018-08-11 MED ORDER — HYDROXYZINE HCL 25 MG PO TABS: 25.0000 mg | ORAL_TABLET | Freq: Four times a day (QID) | ORAL | 0 refills | Status: DC | PRN

## 2018-08-11 MED ORDER — ACETAMINOPHEN 325 MG PO TABS: 650.0000 mg | ORAL_TABLET | Freq: Four times a day (QID) | ORAL | Status: DC | PRN

## 2018-08-11 MED ORDER — HYDROCODONE-ACETAMINOPHEN 5-325 MG PO TABS
1.0000 | ORAL_TABLET | ORAL | Status: DC | PRN
  Administered 2018-08-11 (×2): 2 via ORAL
  Filled 2018-08-11 (×2): qty 2

## 2018-08-11 MED ORDER — ACETAMINOPHEN 325 MG PO TABS: 650.0000 mg | ORAL_TABLET | Freq: Four times a day (QID) | ORAL | 1 refills | Status: DC | PRN

## 2018-08-11 NOTE — Progress Notes (Signed)
  Echocardiogram 2D Echocardiogram has been performed.  Laura [name] 08/11/2018, 9:43 AM

## 2018-08-11 NOTE — Discharge Summary (Signed)
Laura [name], is a 57 y.o. female  DOB 12-05-1960  MRN [id number].  Admission date:  08/10/2018  Admitting Physician  Shela Leff, MD  Discharge Date:  08/11/2018   Primary MD  [name], MD  Recommendations for primary care physician for things to follow:  1) your echocardiogram,  your EKG and your blood work does NOT indicate any evidence of heart attack at this time 2) continue your medications as previous prior to admission- 3) you are taking Eliquis for blood thinner so Avoid ibuprofen/Advil/Aleve/Motrin/Goody Powders/Naproxen/BC powders/Meloxicam/Diclofenac/Indomethacin and other Nonsteroidal anti-inflammatory medications as these will make you more likely to bleed and can cause stomach ulcers, can also cause Kidney problems.  4) follow-up with your cardiologist to discuss possible cardioversion for your atrial fibrillation as previously advised 5) follow-up with your neurologist to discuss management of your chronic headaches 6) please follow-up with your psychiatrist to discuss management of your anxiety and mood disorders 7) please discuss possible stress test as an outpatient with your cardiologist if your cardiologist deems this necessary  Admission Diagnosis  Atypical chest pain [R07.89]   Discharge Diagnosis  Atypical chest pain [R07.89]    Principal Problem:   Atypical chest pain Active Problems:   Severe persistent asthma, uncomplicated   Uncontrolled type 2 diabetes mellitus with hyperglycemia ([place])   Hypothyroidism   Essential hypertension, benign   Mixed hyperlipidemia   Bipolar disorder ([place])   Headache   H/O atrial flutter   OSA on CPAP   Anxiety      Past Medical History:  Diagnosis Date  . Asthma   . Atrial fibrillation ([place])   . Bipolar disorder ([place])   . Bronchiectasis ([place])   . CHF (congestive heart failure) ([place])   . Hypogammaglobulinemia ([place])   .  Hypothyroid   . 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 Osteoarthritis   . PTSD (post-traumatic stress disorder)   . Recurrent sinusitis   . Tremors of nervous system   . Type 2 diabetes mellitus ([place])   . Wound infection     Past Surgical History:  Procedure Laterality Date  . 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 CARDIAC SURGERY    . [place]  . NASAL SINUS SURGERY  2017       HPI  from the history and physical done on the day of admission:    Patient coming from: Home  Chief Complaint: Chest pain  HPI: Laura [name] is a 57 y.o. female with medical history significant of A. Fib/atrial flutter currently on Eliquis, history of ASD repair at the age of 53, insulin-dependent type 2 diabetes, hypertension, severe persistent asthma, OSA, bipolar disorder presenting to the hospital for evaluation of chest pain.  Patient states she has been getting heart palpitations for the past 1-1/2 months but symptoms have been worse today.  States she experience left-sided chest heaviness, 6 out of 10 intensity with exertion around 10 AM this morning and chest pain has been constant since then.  It is associated with  shortness of breath, feeling hot, and dizziness.  Chest heaviness is not associated with nausea or diaphoresis.  No associated wheezing.  States her chest heaviness is better since she has been in the ED but not completely resolved; she is not sure if sublingual nitroglycerin caused any relief.  It did cause her to have a headache which improved with IV Benadryl and IV Metoclopramide in the ED.  States Tylenol does not help.  She is not able to tolerate NSAIDs.  She is having a headache again and is requesting more medication.   ED Course: Afebrile.  Initially slightly tachycardic but subsequent pulse readings normal.  Blood pressure 172/110 on arrival.  [place] well on room air.  I-STAT troponin negative.  EKG not suggestive of ACS.  No leukocytosis.  No anemia.  BNP normal.  D-dimer 0.47.   Chest x-ray showing stable interstitial thickening indicative of underlying chronic inflammatory type change; no frank consolidation or edema.  Patient received 1 L normal saline bolus, aspirin, Tylenol, IV Benadryl, and IV metoclopramide in the ED. [facility] paged to admit.   Hospital Course:     1)Atypical chest pain---- Risk factors for coronary artery disease include hypertension, sedentary lifestyle, diabetes, obesity, and family history.   PE less likely as patient is currently not tachycardic and is not hypoxic.  D-dimer normal.  Received aspirin in the ED.  She appears comfortable on exam and is in no acute distress. She ruled out for ACS by cardiac enzymes, EKG and echocardiogram without wall motion abnormalities and with preserved EF.... Chest pain is consistently reproducible with palpation and positional change .  Suspect musculoskeletal etiology patient may follow-up with PCP and cardiologist for further evaluation and possible outpatient stress test  2)HTN--- resume preadmission medication, follow-up with PCP and cardiologist for adjustments  3)Headache--- history of chronic headaches previously evaluated by neurology as outpatient for headaches MRI brain recently without acute findings.. A neuro exam today is reassuring and nonfocal  4)Persistent atrial flutter/history of atrial fibrillation-- Seen by cardiology 2 days PTA.  Underwent successful DCCV for atrial fibrillation 10+ years ago and has maintained normal sinus rhythm since until recently diagnosed with atrial flutter last month. Noted to be in atrial flutter during this visit as well.  Plan is for cardiology to perform a DCCV following 3 weeks of uninterrupted anticoagulation (around 08/29/18) CHA2DS2VASc at least 3. EKG showing sinus or ectopic atrial rhythm (heart rate 92). -Echocardiogram---noted with preserved EF no difficult regional wall motion normalities -Continue home Eliquis for anticoagulation -Continue Cardizem CD for  rate control  5)Insulin-dependent type 2 diabetes--excellent control, A1c 6.9, continue current insulin regimen  6)Hyperlipidemia-low-fat diet advised, increase activity advised, LDL is 46 however HDL and triglycerides are not at goal.  continue Lipitor 10 mg daily Lab Results  Component Value Date   CHOL 111 08/11/2018   HDL 32 (L) 08/11/2018   LDLCALC 46 08/11/2018   TRIG 165 (H) 08/11/2018   CHOLHDL 3.5 08/11/2018    7)Severe persistent asthma--- continue bronchodilators, no acute exacerbation at this time  8)Hypothyroidism- -Continue home Synthroid  9) morbid obesity with OSA---BMI is over 41, continue CPAP nightly lifestyle changes diet and weight loss advised  10)Bipolar disorder/anxiety--- patient is very anxious, fidgeting a lot, advised to follow-up with psychiatrist for adjustment of psychiatric medications, in the meantime Continue home Abilify, lamotrigine, Depakote, -Continue home Xanax prn  Discharge Condition: Stable  Follow UP--cardiologist and PCP  Diet and Activity recommendation:  As advised  Discharge Instructions  Discharge Instructions    Call MD for:  difficulty breathing, headache or visual disturbances   Complete by:  As directed    Call MD for:  persistant dizziness or light-headedness   Complete by:  As directed    Call MD for:  persistant nausea and vomiting   Complete by:  As directed    Call MD for:  temperature >100.4   Complete by:  As directed    Diet - low sodium heart healthy   Complete by:  As directed    Diet Carb Modified   Complete by:  As directed    Discharge instructions   Complete by:  As directed    1) your echocardiogram,  your EKG and your blood work does NOT indicate any evidence of heart attack at this time 2) continue your medications as previous prior to admission- 3) you are taking Eliquis for blood thinner so Avoid ibuprofen/Advil/Aleve/Motrin/Goody Powders/Naproxen/BC powders/Meloxicam/Diclofenac/Indomethacin  and other Nonsteroidal anti-inflammatory medications as these will make you more likely to bleed and can cause stomach ulcers, can also cause Kidney problems.  4) follow-up with your cardiologist to discuss possible cardioversion for your atrial fibrillation as previously advised 5) follow-up with your neurologist to discuss management of your chronic headaches 6) please follow-up with your psychiatrist to discuss management of your anxiety and mood disorders 7) please discuss possible stress test as an outpatient with your cardiologist if your cardiologist deems this necessary   Increase activity slowly   Complete by:  As directed         Discharge Medications     Allergies as of 08/11/2018      Reactions   Ativan [lorazepam] Hives   Phenergan [promethazine Hcl] Hives      Medication List    TAKE these medications   acetaminophen 325 MG tablet Commonly known as:  TYLENOL Take 2 tablets (650 mg total) by mouth every 6 (six) hours as needed for mild pain, fever or headache.   AEROCHAMBER MV inhaler Use as instructed   ALPRAZolam 0.5 MG tablet Commonly known as:  XANAX Take 0.5 mg by mouth daily as needed for anxiety.   ARIPiprazole 5 MG tablet Commonly known as:  ABILIFY Take 5 mg by mouth every evening.   atorvastatin 10 MG tablet Commonly known as:  LIPITOR Take 10 mg by mouth every evening.   CUVITRU 4 GM/20ML Soln Generic drug:  Immune Globulin (Human) Inject 100 mLs into the skin every 14 (fourteen) days.   diltiazem 300 MG 24 hr capsule Commonly known as:  CARDIZEM CD Take 300 mg by mouth every morning.   divalproex 500 MG 24 hr tablet Commonly known as:  DEPAKOTE ER Take 1,500 mg by mouth 2 (two) times daily.   ELIQUIS 5 MG Tabs tablet Generic drug:  apixaban Take 5 mg by mouth 2 (two) times daily.   furosemide 20 MG tablet Commonly known as:  LASIX Take 20-40 mg by mouth See admin instructions. Take 2 tablets (40mg  total) every morning and 1 tablet  (20mg  total)  at noon.   gabapentin 600 MG tablet Commonly known as:  NEURONTIN Take 600 mg by mouth 3 (three) times daily.   hydrALAZINE 25 MG tablet Commonly known as:  APRESOLINE Take 25 mg by mouth 3 (three) times daily.   hydrochlorothiazide 25 MG tablet Commonly known as:  HYDRODIURIL Take 25 mg by mouth every morning.   hydrOXYzine 25 MG tablet Commonly known as:  ATARAX/VISTARIL Take 1 tablet (25 mg total)  by mouth every 6 (six) hours as needed for anxiety or nausea. What changed:    when to take this  reasons to take this   lamoTRIgine 100 MG tablet Commonly known as:  LAMICTAL Take 50 mg by mouth at bedtime.   LANTUS 100 UNIT/ML injection Generic drug:  insulin glargine Inject 60 Units into the skin at bedtime.   levothyroxine 112 MCG tablet Commonly known as:  SYNTHROID, LEVOTHROID Take 112 mcg by mouth daily before breakfast.   lisinopril 40 MG tablet Commonly known as:  PRINIVIL,ZESTRIL Take 40 mg by mouth every morning.   Melatonin 3 MG Tabs Take 3 mg by mouth at bedtime.   metFORMIN 500 MG 24 hr tablet Commonly known as:  GLUCOPHAGE-XR Take 1,000 mg by mouth 2 (two) times daily.   mometasone-formoterol 200-5 MCG/ACT Aero Commonly known as:  DULERA Inhale 2 puffs into the lungs 2 (two) times daily.   PROAIR RESPICLICK 914 (90 Base) MCG/ACT Aepb Generic drug:  Albuterol Sulfate Inhale 1 puff into the lungs every 6 (six) hours as needed (for shortness of breath/wheezing).   tiotropium 18 MCG inhalation capsule Commonly known as:  SPIRIVA Place 18 mcg into inhaler and inhale daily as needed (for breathing).   TRULICITY 1.5 NW/2.9FA Sopn Generic drug:  Dulaglutide Inject 1.5 mg into the skin every Sunday.       Major procedures and Radiology Reports - PLEASE review detailed and final reports for all details, in brief -   Dg Chest 2 View  Result Date: 08/10/2018 CLINICAL DATA:  Chest pain and shortness of breath EXAM: CHEST - 2 VIEW  COMPARISON:  June 16, 2018 FINDINGS: There is stable somewhat generalized interstitial thickening. There is no appreciable edema or consolidation. Heart size and pulmonary vascularity are normal. No adenopathy. No bone lesions. There is aortic atherosclerosis. IMPRESSION: Stable interstitial thickening, a finding likely indicative of underlying chronic inflammatory type change. There is no frank edema or consolidation. Cardiac silhouette within normal limits. No adenopathy. There is aortic atherosclerosis. Aortic Atherosclerosis (ICD10-I70.0). Electronically Signed   By: Lowella Grip III M.D.   On: 08/10/2018 15:17   Mr Jeri Cos OZ Contrast  Result Date: 07/18/2018  [facility] [address] [place], [place] [address] [phone] NEUROIMAGING REPORT STUDY DATE: 07/18/2018 PATIENT NAME: Kirby Argueta DOB: 08-05-1961 MRN: [id number] ORDERING CLINICIAN: Dr Rexene Alberts CLINICAL HISTORY:  89 year patient with bilateral hand tremors COMPARISON FILMS: CT Head 07/10/18 EXAM: MRI Brain w/wo TECHNIQUE: MRI of the brain with and without contrast was obtained utilizing 5 mm axial slices with T1, T2, T2 flair, T2 star gradient echo and diffusion weighted views.  T1 sagittal, T2 coronal and postcontrast views in the axial and coronal plane were obtained. CONTRAST: 15 ml iv multihance IMAGING SITE: [facility] FINDINGS: The brain parenchyma shows minor changes of age-related chronic microvascular ischemia.  No structural lesion, tumor or infarcts are noted.  Gradient echo sequences show few areas of microhemorrhages in the left frontal and parietal subcortical white matter and midline cerebellum on the right likely from microvascular disease.  The paranasal sinuses show mild chronic inflammatory changes.  The pituitary sella is enlarged with atrophic pituitary.  There are mild degenerative changes of the upper cervical spine noted mostly at C3-4.No abnormal lesions are seen on  diffusion-weighted views to suggest acute ischemia. The cortical sulci, fissures and cisterns are normal in size and appearance. Lateral, third and fourth ventricle are normal in size and appearance. No extra-axial fluid collections  are seen. No evidence of mass effect or midline shift.  No abnormal lesions are seen on post contrast views.  On sagittal views the posterior fossa, pituitary gland and corpus callosum are unremarkable. No evidence of intracranial hemorrhage on gradient-echo views. The orbits and their contents, paranasal sinuses and calvarium are unremarkable.  Intracranial flow voids are present.   Unremarkable MRI scan of the brain with and without contrast INTERPRETING PHYSICIAN: PRAMOD SETHI, MD Certified in  Neuroimaging by [employer] and [employer] for Neurological Product manager Results    No results found for this or any previous visit (from the past 240 hour(s)).  Today   Subjective    Laasya Peyton today has no new complaints, worried about anxiety getting worse when she gets home          Patient has been seen and examined prior to discharge   Objective   Blood pressure (!) 117/54, pulse 65, temperature 98.2 F (36.8 C), resp. rate 17, height 5\' 4"  (1.626 m), weight 110 kg, SpO2 95 %.   Intake/Output Summary (Last 24 hours) at 08/11/2018 1308 Last data filed at 08/11/2018 0917 Gross per 24 hour  Intake 1240 ml  Output -  Net 1240 ml    Exam Gen:- Awake Alert,  Morbidly obese, in no acute distress, speaking in complete sentences HEENT:- [place].AT, No sclera icterus Neck-Supple Neck,No JVD,.  Lungs-  CTAB , good air movement CV- S1, S2 normal, regular, prior sternotomy scar from Heart surgery as a child Abd-  +ve B.Sounds, Abd Soft, No tenderness, increased truncal adiposity    Extremity/Skin:- No  edema,   negative Homans, good pulses Psych-affect is anxious, oriented x3 Neuro-no new focal deficits, no tremors   Data  Review   CBC w Diff:  Lab Results  Component Value Date   WBC 7.1 08/10/2018   HGB 15.9 (H) 08/10/2018   HGB 13.5 02/08/2018   HCT 46.6 (H) 08/10/2018   HCT 40.2 02/08/2018   PLT 235 08/10/2018   PLT 204 02/08/2018   LYMPHOPCT 38 08/10/2018   MONOPCT 6 08/10/2018   EOSPCT 1 08/10/2018   BASOPCT 1 08/10/2018    CMP:  Lab Results  Component Value Date   NA 139 08/10/2018   K 4.0 08/10/2018   CL 101 08/10/2018   CO2 26 08/10/2018   BUN 17 08/10/2018   CREATININE 0.58 08/10/2018   CREATININE 0.51 03/13/2018   PROT 6.7 03/13/2018   BILITOT 0.4 03/13/2018   AST 10 03/13/2018   ALT 8 03/13/2018  .   Total Discharge time is about 33 minutes  Roxan Hockey M.D on 08/11/2018 at 1:08 PM  Pager---[phone]  Go to [email or web address] - password [email or web address] for contact info  [facility] - Office  [phone]

## 2018-08-11 NOTE — Progress Notes (Signed)
Pt discharged home today per Dr. Emokpae.  Pt's IV site D/C'd and WDL.  Pt's VSS.  Pt provided with home medication list, discharge instructions and prescriptions.  Verbalized understanding.  Pt left floor via WC in stable condition accompanied by NT. 

## 2018-08-11 NOTE — Discharge Instructions (Signed)
1) your echocardiogram,  your EKG and your blood work does NOT indicate any evidence of heart attack at this time 2) continue your medications as previous prior to admission- 3) you are taking Eliquis for blood thinner so Avoid ibuprofen/Advil/Aleve/Motrin/Goody Powders/Naproxen/BC powders/Meloxicam/Diclofenac/Indomethacin and other Nonsteroidal anti-inflammatory medications as these will make you more likely to bleed and can cause stomach ulcers, can also cause Kidney problems.  4) follow-up with your cardiologist to discuss possible cardioversion for your atrial fibrillation as previously advised 5) follow-up with your neurologist to discuss management of your chronic headaches 6) please follow-up with your psychiatrist to discuss management of your anxiety and mood disorders 7) please discuss possible stress test as an outpatient with your cardiologist if your cardiologist deems this necessary

## 2018-08-12 LAB — HIV ANTIBODY (ROUTINE TESTING W REFLEX): HIV SCREEN 4TH GENERATION: NONREACTIVE

## 2018-08-17 ENCOUNTER — Other Ambulatory Visit (HOSPITAL_COMMUNITY): Payer: Medicare Other

## 2018-08-21 ENCOUNTER — Other Ambulatory Visit (HOSPITAL_COMMUNITY): Payer: Self-pay

## 2018-08-24 ENCOUNTER — Encounter (HOSPITAL_COMMUNITY): Payer: Self-pay

## 2018-08-24 ENCOUNTER — Encounter (HOSPITAL_COMMUNITY)
Admission: RE | Admit: 2018-08-24 | Discharge: 2018-08-24 | Disposition: A | Discharge status: Home / Self Care | Payer: Medicare Other | Source: Ambulatory Visit | Attending: Cardiology | Admitting: Cardiology

## 2018-08-24 ENCOUNTER — Other Ambulatory Visit: Payer: Self-pay

## 2018-08-24 DIAGNOSIS — Z01812 Encounter for preprocedural laboratory examination: Secondary | ICD-10-CM | POA: Insufficient documentation

## 2018-08-24 HISTORY — DX: Sleep apnea, unspecified: G47.30

## 2018-08-24 HISTORY — DX: Other complications of anesthesia, initial encounter: T88.59XA

## 2018-08-24 HISTORY — DX: Transient cerebral ischemic attack, unspecified: G45.9

## 2018-08-24 HISTORY — DX: Other amnesia: R41.3

## 2018-08-24 HISTORY — DX: Adverse effect of unspecified anesthetic, initial encounter: T41.45XA

## 2018-08-24 HISTORY — DX: Polyneuropathy, unspecified: G62.9

## 2018-08-24 LAB — CBC WITH DIFFERENTIAL/PLATELET
ABS IMMATURE GRANULOCYTES: 0.07 10*3/uL (ref 0.00–0.07)
Basophils Absolute: 0 10*3/uL (ref 0.0–0.1)
Basophils Relative: 1 %
Eosinophils Absolute: 0 10*3/uL (ref 0.0–0.5)
Eosinophils Relative: 1 %
HEMATOCRIT: 42.6 % (ref 36.0–46.0)
HEMOGLOBIN: 14.5 g/dL (ref 12.0–15.0)
Immature Granulocytes: 1 %
LYMPHS ABS: 2 10*3/uL (ref 0.7–4.0)
Lymphocytes Relative: 35 %
MCH: 31.8 pg (ref 26.0–34.0)
MCHC: 34 g/dL (ref 30.0–36.0)
MCV: 93.4 fL (ref 80.0–100.0)
MONO ABS: 0.4 10*3/uL (ref 0.1–1.0)
MONOS PCT: 7 %
NEUTROS PCT: 55 %
NRBC: 0 % (ref 0.0–0.2)
Neutro Abs: 3.2 10*3/uL (ref 1.7–7.7)
Platelets: 169 10*3/uL (ref 150–400)
RBC: 4.56 MIL/uL (ref 3.87–5.11)
RDW: 12.2 % (ref 11.5–15.5)
WBC: 5.7 10*3/uL (ref 4.0–10.5)

## 2018-08-24 LAB — BASIC METABOLIC PANEL
Anion gap: 14 (ref 5–15)
BUN: 22 mg/dL — ABNORMAL HIGH (ref 6–20)
CHLORIDE: 99 mmol/L (ref 98–111)
CO2: 25 mmol/L (ref 22–32)
Calcium: 9.2 mg/dL (ref 8.9–10.3)
Creatinine, Ser: 0.64 mg/dL (ref 0.44–1.00)
GFR calc Af Amer: >60 mL/min (ref >60)
GFR calc non Af Amer: >60 mL/min (ref >60)
Glucose, Bld: 121 mg/dL — ABNORMAL HIGH (ref 70–99)
POTASSIUM: 3.9 mmol/L (ref 3.5–5.1)
SODIUM: 138 mmol/L (ref 135–145)

## 2018-08-24 NOTE — Pre-Procedure Instructions (Signed)
Patient in for PAT for cardioversion. While questioning her concerning anesthesia complications, she states that "I went in for a nasal surgery and woke up in ICU and had to stay for 5 days. The Doctor told me that I had an allergic reaction to a combination of the anesthesia and the pain medication". Upon further questioning, she did not know what kind of reaction she had or what put her in ICU and she does not remember much about the stay. She has no records of this and is from [place]. She states that she thought that [facility] was requesting these records, but they were contacted and they have not done this yet. Randall Hiss came in to speak with patient and consulted with Dr Currie Paris who wants these records before we do this procedure. Helene Kelp Doss,ST obtained information from patient and we had release of information sheet signed and faxed to [place] to obtain this information.

## 2018-08-29 ENCOUNTER — Encounter (HOSPITAL_COMMUNITY): Payer: Self-pay | Admitting: *Deleted

## 2018-08-29 ENCOUNTER — Other Ambulatory Visit: Payer: Self-pay

## 2018-08-29 ENCOUNTER — Ambulatory Visit (HOSPITAL_COMMUNITY): Payer: Medicare Other | Admitting: Anesthesiology

## 2018-08-29 ENCOUNTER — Encounter (HOSPITAL_COMMUNITY)
Admission: RE | Disposition: A | Discharge status: Home / Self Care | Payer: Self-pay | Source: Ambulatory Visit | Attending: Cardiology

## 2018-08-29 ENCOUNTER — Ambulatory Visit (HOSPITAL_COMMUNITY)
Admission: RE | Admit: 2018-08-29 | Discharge: 2018-08-29 | Disposition: A | Discharge status: Home / Self Care | Payer: Medicare Other | Source: Ambulatory Visit | Attending: Cardiology | Admitting: Cardiology

## 2018-08-29 DIAGNOSIS — Z87891 Personal history of nicotine dependence: Secondary | ICD-10-CM | POA: Diagnosis not present

## 2018-08-29 DIAGNOSIS — E119 Type 2 diabetes mellitus without complications: Secondary | ICD-10-CM | POA: Diagnosis not present

## 2018-08-29 DIAGNOSIS — Z79899 Other long term (current) drug therapy: Secondary | ICD-10-CM | POA: Diagnosis not present

## 2018-08-29 DIAGNOSIS — I4892 Unspecified atrial flutter: Secondary | ICD-10-CM | POA: Insufficient documentation

## 2018-08-29 DIAGNOSIS — I10 Essential (primary) hypertension: Secondary | ICD-10-CM | POA: Diagnosis not present

## 2018-08-29 DIAGNOSIS — I480 Paroxysmal atrial fibrillation: Secondary | ICD-10-CM | POA: Diagnosis not present

## 2018-08-29 DIAGNOSIS — E039 Hypothyroidism, unspecified: Secondary | ICD-10-CM | POA: Diagnosis not present

## 2018-08-29 DIAGNOSIS — Z7901 Long term (current) use of anticoagulants: Secondary | ICD-10-CM | POA: Diagnosis not present

## 2018-08-29 DIAGNOSIS — I110 Hypertensive heart disease with heart failure: Secondary | ICD-10-CM | POA: Diagnosis not present

## 2018-08-29 DIAGNOSIS — J45909 Unspecified asthma, uncomplicated: Secondary | ICD-10-CM | POA: Diagnosis not present

## 2018-08-29 DIAGNOSIS — I4891 Unspecified atrial fibrillation: Secondary | ICD-10-CM | POA: Diagnosis not present

## 2018-08-29 DIAGNOSIS — G4730 Sleep apnea, unspecified: Secondary | ICD-10-CM | POA: Diagnosis not present

## 2018-08-29 DIAGNOSIS — G4733 Obstructive sleep apnea (adult) (pediatric): Secondary | ICD-10-CM | POA: Insufficient documentation

## 2018-08-29 DIAGNOSIS — Z794 Long term (current) use of insulin: Secondary | ICD-10-CM | POA: Diagnosis not present

## 2018-08-29 DIAGNOSIS — I509 Heart failure, unspecified: Secondary | ICD-10-CM | POA: Diagnosis not present

## 2018-08-29 HISTORY — PX: CARDIOVERSION: SHX1299

## 2018-08-29 LAB — GLUCOSE, CAPILLARY: Glucose-Capillary: 150 mg/dL — ABNORMAL HIGH (ref 70–99)

## 2018-08-29 SURGERY — CARDIOVERSION
Anesthesia: General

## 2018-08-29 MED ORDER — MIDAZOLAM HCL 5 MG/5ML IJ SOLN
INTRAMUSCULAR | Status: DC | PRN
  Administered 2018-08-29 (×2): 1 mg via INTRAVENOUS

## 2018-08-29 MED ORDER — MIDAZOLAM HCL 2 MG/2ML IJ SOLN
INTRAMUSCULAR | Status: AC
  Filled 2018-08-29: qty 2

## 2018-08-29 MED ORDER — LACTATED RINGERS IV SOLN
INTRAVENOUS | Status: DC | PRN
  Administered 2018-08-29: 09:00:00 via INTRAVENOUS

## 2018-08-29 MED ORDER — LACTATED RINGERS IV SOLN: INTRAVENOUS | Status: DC

## 2018-08-29 MED ORDER — PROPOFOL 10 MG/ML IV BOLUS
INTRAVENOUS | Status: DC | PRN
  Administered 2018-08-29: 20 mg via INTRAVENOUS
  Administered 2018-08-29: 30 mg via INTRAVENOUS

## 2018-08-29 MED ORDER — HYDROCODONE-ACETAMINOPHEN 7.5-325 MG PO TABS: 1.0000 | ORAL_TABLET | Freq: Once | ORAL | Status: DC | PRN

## 2018-08-29 MED ORDER — HYDROMORPHONE HCL 1 MG/ML IJ SOLN: 0.2500 mg | INTRAMUSCULAR | Status: DC | PRN

## 2018-08-29 NOTE — Progress Notes (Signed)
Electrical Cardioversion Procedure Note Kerie Badger [id number] 11-09-1960   Procedure: Electrical Cardioversion Indications:  Atrial Flutter  Procedure Details Consent: Risks of procedure as well as the alternatives and risks of each were explained to the (patient/caregiver).  Consent for procedure obtained. Time Out: Verified patient identification, verified procedure, site/side was marked, verified correct patient position, special equipment/implants available, medications/allergies/relevent history reviewed, required imaging and test results available.  Performed  Patient placed on cardiac monitor, pulse oximetry, supplemental oxygen as necessary.  Sedation given: propofol given per anesthesia Pacer pads placed anterior and posterior chest.  Cardioverted 1 time(s).  Cardioverted at [place].  Evaluation Findings: Post procedure EKG shows: NSR with first degree block Complications: None Patient did tolerate procedure well.   Jerrye Beavers 08/29/2018, 11:01 AM

## 2018-08-29 NOTE — CV Procedure (Signed)
CV Procedure Note  Prodecure: Direct current cardioversion Indication: aflutter Physician: Dr Carlyle Dolly MD  Patient was brought to the procedure suite after appropriate consent was obtained. Defib pads were placed in the anterior and posterior position. Sedation was achieved with the assistance of anesthesiology, please refer to there note for full details. 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 Cardiopulmonary monitoring was performed throughout the procedure, she tolerated well without complications.   Zandra Abts MD

## 2018-08-29 NOTE — Transfer of Care (Addendum)
Immediate Anesthesia Transfer of Care Note  Patient: Laura [name]  Procedure(s) Performed: CARDIOVERSION (N/A )  Patient Location: PACU   Anesthesia Type:general  Level of Consciousness: awake  Airway & Oxygen Therapy: Patient Spontanous Breathing  Post-op Assessment: Report given to RN  Post vital signs: Reviewed  Last Vitals:  Vitals Value Taken Time  BP    Temp    Pulse 72 08/29/2018 10:41 AM  Resp 20 08/29/2018 10:41 AM  SpO2 100 % 08/29/2018 10:41 AM  Vitals shown include unvalidated device data.  Last Pain:  Vitals:   08/29/18 0925  TempSrc: Oral  PainSc: 0-No pain      Patients Stated Pain Goal: 7 (43/53/91 2258)  Complications: No apparent anesthesia complications

## 2018-08-29 NOTE — Anesthesia Postprocedure Evaluation (Signed)
Anesthesia Post Note  Patient: Laura [name]  Procedure(s) Performed: CARDIOVERSION (N/A )  Patient location during evaluation: Short Stay Anesthesia Type: General Level of consciousness: awake and alert and patient cooperative Pain management: pain level controlled Vital Signs Assessment: post-procedure vital signs reviewed and stable Respiratory status: spontaneous breathing Cardiovascular status: stable Postop Assessment: no apparent nausea or vomiting Anesthetic complications: no     Last Vitals:  Vitals:   08/29/18 1100 08/29/18 1120  BP: 117/63 (!) 116/51  Pulse: 70 69  Resp: 16 20  Temp:  36.7 C  SpO2: 96% 96%    Last Pain:  Vitals:   08/29/18 1120  TempSrc:   PainSc: 0-No pain                 Chirstopher Iovino

## 2018-08-29 NOTE — H&P (Signed)
Procedure H&P  Patient presents for electrical cardioversion for atrial flutter. Please see referenced recent clinic note below for full history. She has been on eliquis at home over 3 weeks without missed doses.   Expand All Collapse All      Cardiology Office Note    Date:  08/08/2018   ID:  Laura [name], DOB 07-May-1961, MRN [id number]  PCP:  [name], MD           Cardiologist: Kate Sable, MD        Chief Complaint  Patient presents with  . Follow-up    recent Emergency Dept visit    History of Present Illness:    Laura [name] is a 57 y.o. female with past medical history of PAF, prior ASD repair (at 57 years of age), IDDM, HTN, OSA, severe persistent asthma, and Bipolar Disorder who presents to the office today for follow-up from her recent Emergency Department visit.  She was last examined Dr. Bronson Ing in 03/2018 and reported occasional palpitations in regards to her atrial fibrillation but denied any persistent symptoms. She was continued on her current medication regimen at that time including Cardizem CD 300 mg daily and Eliquis for anticoagulation. An echocardiogram have been obtained in 2016 by her report when she was living in [place], therefore these records were requested.  In the interim, she was evaluated at [facility] ED on 07/10/2018 for headaches but was found to be in rate controlled atrial flutter. Labs at that time showed WBC 6.0, Hgb 14.0, platelets 203, Na+ 139, K+ 4.2, and creatinine 0.88. She was informed to follow-up with Cardiology in the outpatient setting for further evaluation.  In talking with the patient today, she reports having frequent episodes of her heart "fluttering" and feels like this has been occurring since her ED visit. She was surprised to find out that she was out of rhythm as she reports having a cardioversion 10+ years ago and has maintained normal sinus rhythm since by her report with only occasional  palpitations. She denies any associated tachypalpitations. No associated dyspnea, lightheadedness, dizziness, presyncope, or chest pain. She denies any recent orthopnea, PND, or lower extremity edema. She does have an essential tremor which has been present for several months per her report.  She has remained on Eliquis for anticoagulation but reports missing 1-2 doses early last week. She denies any recent melena, hematochezia, or hematuria.      Past Medical History:  Diagnosis Date  . Asthma   . Atrial fibrillation ([place])   . Bipolar disorder ([place])   . Bronchiectasis ([place])   . CHF (congestive heart failure) ([place])   . Hypogammaglobulinemia ([place])   . Hypothyroid   . 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 Osteoarthritis   . PTSD (post-traumatic stress disorder)   . Recurrent sinusitis   . Type 2 diabetes mellitus ([place])   . Wound infection          Past Surgical History:  Procedure Laterality Date  . 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 CARDIAC SURGERY    . [place]  . NASAL SINUS SURGERY  2017    Current Medications:       Outpatient Medications Prior to Visit  Medication Sig Dispense Refill  . Albuterol Sulfate (PROAIR RESPICLICK) 941 (90 Base) MCG/ACT AEPB Inhale 1 puff into the lungs every 6 (six) hours as needed (for shortness of breath/wheezing).     . ALPRAZolam (XANAX) 0.5 MG tablet Take 0.5 mg by  mouth daily as needed for anxiety.   0  . apixaban (ELIQUIS) 5 MG TABS tablet Take 5 mg by mouth 2 (two) times daily.    . ARIPiprazole (ABILIFY) 10 MG tablet Take 10 mg by mouth daily.   0  . atorvastatin (LIPITOR) 10 MG tablet Take 10 mg by mouth daily.    Marland Kitchen diltiazem (CARDIZEM CD) 300 MG 24 hr capsule Take 300 mg by mouth daily.    . divalproex (DEPAKOTE ER) 500 MG 24 hr tablet Take 1,500 mg by mouth 2 (two) times daily.    . Dulaglutide (TRULICITY) 1.5 TD/1.7OH SOPN Inject 1.5 mg into the skin every Sunday.     . furosemide (LASIX) 20 MG  tablet Take 20-40 mg by mouth See admin instructions. Take 2 tablets (40mg  total) every morning and 1 tablet (20mg  total)  at noon.    . gabapentin (NEURONTIN) 600 MG tablet Take 600 mg by mouth 3 (three) times daily.    . hydrALAZINE (APRESOLINE) 25 MG tablet Take 25 mg by mouth 3 (three) times daily.    . hydrochlorothiazide (HYDRODIURIL) 25 MG tablet Take 25 mg by mouth daily.    . hydrOXYzine (ATARAX/VISTARIL) 25 MG tablet Take 25 mg by mouth 3 (three) times daily as needed for anxiety.     . Immune Globulin, Human, (CUVITRU) 4 GM/20ML SOLN Inject 100 mLs into the skin every 14 (fourteen) days.    . insulin glargine (LANTUS) 100 UNIT/ML injection Inject 60 Units into the skin at bedtime.    . lamoTRIgine (LAMICTAL) 100 MG tablet Take 50 mg by mouth at bedtime.     Marland Kitchen levothyroxine (SYNTHROID, LEVOTHROID) 112 MCG tablet Take 112 mcg by mouth daily before breakfast.    . lisinopril (PRINIVIL,ZESTRIL) 40 MG tablet Take 40 mg by mouth daily.    . Melatonin 3 MG TABS Take 3 mg by mouth at bedtime.    . metFORMIN (GLUCOPHAGE-XR) 500 MG 24 hr tablet Take 1,000 mg by mouth 2 (two) times daily.   0  . mometasone-formoterol (DULERA) 200-5 MCG/ACT AERO Inhale 2 puffs into the lungs 2 (two) times daily. 1 Inhaler 0  . ONE TOUCH ULTRA TEST test strip TEST     QID UTD  1  . Spacer/Aero-Holding Chambers (AEROCHAMBER MV) inhaler Use as instructed 1 each 0  . tiotropium (SPIRIVA) 18 MCG inhalation capsule Place 18 mcg into inhaler and inhale daily as needed (for breathing).      No facility-administered medications prior to visit.      Allergies:   Ativan [lorazepam] and Phenergan [promethazine hcl]   Social History   Socioeconomic History  . Marital status: Widowed    Spouse name: Not on file  . Number of children: Not on file  . Years of education: Not on file  . Highest education level: Not on file  Occupational History  . Not on file  Social Needs  . Financial  resource strain: Not on file  . Food insecurity:    Worry: Not on file    Inability: Not on file  . Transportation needs:    Medical: Not on file    Non-medical: Not on file  Tobacco Use  . Smoking status: Former Smoker    Packs/day: 1.00    Years: 17.00    Pack years: 17.00    Types: Cigarettes    Last attempt to quit: 2008    Years since quitting: 11.8  . Smokeless tobacco: Never Used  Substance and Sexual Activity  .  Alcohol use: Not Currently  . Drug use: Not Currently  . Sexual activity: Not on file  Lifestyle  . Physical activity:    Days per week: Not on file    Minutes per session: Not on file  . Stress: Not on file  Relationships  . Social connections:    Talks on phone: Not on file    Gets together: Not on file    Attends religious service: Not on file    Active member of club or organization: Not on file    Attends meetings of clubs or organizations: Not on file    Relationship status: Not on file  Other Topics Concern  . Not on file  Social History Narrative  . Not on file     Family History:  The patient's family history includes Bipolar disorder in her sister; Celiac disease in her daughter; Hashimoto's thyroiditis in her daughter; Heart attack in her mother; Heart disease in her brother and maternal aunt; Hypertension in her mother and sister; Kidney disease in her mother and sister; Stroke in her maternal aunt and mother; Thyroid disease in her daughter, daughter, daughter, and sister.   Review of Systems:   Please see the history of present illness.     General:  No chills, fever, night sweats or weight changes.  Cardiovascular:  No chest pain, dyspnea on exertion, edema, orthopnea,  paroxysmal nocturnal dyspnea. Positive for palpitations.  Dermatological: No rash, lesions/masses Respiratory: No cough, dyspnea Urologic: No hematuria, dysuria Abdominal:   No nausea, vomiting, diarrhea, bright red blood per rectum,  melena, or hematemesis Neurologic:  No visual changes, wkns, changes in mental status. All other systems reviewed and are otherwise negative except as noted above.   Physical Exam:    VS:  BP 132/80 (BP Location: Left Arm)   Pulse 95   Ht 5\' 4"  (1.626 m)   Wt 244 lb (110.7 kg)   SpO2 95%   BMI 41.88 kg/m    General: Well developed, well nourished Caucasian female appearing in no acute distress. Head: Normocephalic, atraumatic, sclera non-icteric, no xanthomas, nares are without discharge.  Neck: No carotid bruits. JVD not elevated.  Lungs: Respirations regular and unlabored, without wheezes or rales.  Heart: Irregularly irregular. No S3 or S4.  No murmur, no rubs, or gallops appreciated. Abdomen: Soft, non-tender, non-distended with normoactive bowel sounds. No hepatomegaly. No rebound/guarding. No obvious abdominal masses. Msk:  Strength and tone appear normal for age. No joint deformities or effusions. Extremities: No clubbing or cyanosis. No lower extremity edema.  Distal pedal pulses are 2+ bilaterally. Neuro: Alert and oriented X 3. Moves all extremities spontaneously. No focal deficits noted. Psych:  Responds to questions appropriately with a normal affect. Skin: No rashes or lesions noted     Wt Readings from Last 3 Encounters:  08/08/18 244 lb (110.7 kg)  07/10/18 241 lb 13.5 oz (109.7 kg)  07/03/18 242 lb (109.8 kg)      Studies/Labs Reviewed:   EKG:  EKG is ordered today.  The ekg ordered today demonstrates rate-controlled atrial flutter, HR 88.  Recent Labs: 03/13/2018: ALT 8; TSH 1.00 07/10/2018: BUN 23; Creatinine, Ser 0.88; Hemoglobin 14.0; Platelets 203; Potassium 4.2; Sodium 139   Lipid Panel Labs(Brief)  No results found for: CHOL, TRIG, HDL, CHOLHDL, VLDL, LDLCALC, LDLDIRECT    Additional studies/ records that were reviewed today include:   CXR: 06/2018 FINDINGS: No pneumothorax. The heart size borderline. The hila and mediastinum are  unremarkable. No overt edema,  nodule, mass, or focal infiltrate.  IMPRESSION: No active cardiopulmonary disease.  Echocardiogram - Ordered. No prior imaging on File.   Assessment:    1. Atrial flutter with controlled response ([place])   2. Current use of long term anticoagulation   3. Essential hypertension   4. H/O congenital atrial septal defect (ASD) repair      Plan:   In order of problems listed above:  1. Persistent Atrial Flutter/ History of Atrial Fibrillation - underwent successful DCCV for atrial fibrillation 10+ years ago by her report and had maintained NSR since until recently diagnosed with atrial flutter earlier this month. She denies any associated chest pain or dyspnea but has experienced a "fluttering" in her chest since being diagnosed.  - computer-generated read for EKG today initially said NSR but it is difficult to discern actual p-waves. Reviewed with Dr. Bronson Ing and he was in agreement she appears to still be in flutter. Will plan to perform a DCCV following 3 weeks of uninterrupted anticoagulation (procedure initially scheduled with Dr. Bronson Ing but needed to be switched to Dr. Harl Bowie given she missed a dose of Eliquis last week and she is going out of town on 09/01/2018). Procedure, risks, and benefits were reviewed with the patient and she agrees to proceed.  - will update her echocardiogram as most recent imaging was performed in 2016. Just had labs by her PCP and will request these records. Continue Cardizem CD for rate-control along with Eliquis for anticoagulation.   2. Use of Long-Term Anticoagulation - Denies any evidence of active bleeding. She did miss 1-2 doses of Eliquis early last week. Importance of compliance with anticoagulation was reviewed with the patient, especially given her upcoming DCCV. Was advised to make [place] aware if she misses any doses in the interim.   3. HTN - BP is well-controlled at 132/80 during today's visit.  -  continue current medication regimen.   4. History of ASD Repair - occurring at age 72 by the patient's report. Will obtain a repeat echocardiogram as outlined above.       Signed, Erma Heritage, PA-C  08/08/2018 8:20 PM    [place] [address] [place], [place] [address] Phone: [phone]

## 2018-08-29 NOTE — Anesthesia Preprocedure Evaluation (Signed)
Anesthesia Evaluation  Patient identified by MRN, date of birth, ID band Patient awake  General Assessment Comment:States has prev issues with ? Narcotics  No records avail for this  Will avoid narcotics as possible and proceed   Reviewed: Allergy & Precautions, NPO status , Patient's Chart, lab work & pertinent test results, reviewed documented beta blocker date and time   History of Anesthesia Complications (+) history of anesthetic complications  Airway Mallampati: II  TM Distance: >3 FB Neck ROM: Full    Dental no notable dental hx. (+) Teeth Intact   Pulmonary shortness of breath and at rest, asthma , sleep apnea and Continuous Positive Airway Pressure Ventilation , former smoker,    Pulmonary exam normal breath sounds clear to auscultation       Cardiovascular Exercise Tolerance: Good hypertension, +CHF  Normal cardiovascular examI Rhythm:Regular Rate:Normal  Pt states can walk 3 blocks on level ground  Denies recent CHF Here for CV - No TEE   Neuro/Psych  Headaches, PSYCHIATRIC DISORDERS Anxiety Bipolar Disorder TIA   GI/Hepatic Neg liver ROS, GERD  Medicated and Controlled,  Endo/Other  diabetes, Well Controlled, Type 1Hypothyroidism   Renal/GU negative Renal ROS  negative genitourinary   Musculoskeletal  (+) Arthritis , Osteoarthritis,    Abdominal   Peds negative pediatric ROS (+)  Hematology negative hematology ROS (+)   Anesthesia Other Findings   Reproductive/Obstetrics negative OB ROS                             Anesthesia Physical Anesthesia Plan  ASA: IV  Anesthesia Plan: General   Post-op Pain Management:    Induction: Intravenous  PONV Risk Score and Plan:   Airway Management Planned: Nasal Cannula and Simple Face Mask  Additional Equipment:   Intra-op Plan:   Post-operative Plan:   Informed Consent: I have reviewed the patients History and Physical,  chart, labs and discussed the procedure including the risks, benefits and alternatives for the proposed anesthesia with the patient or authorized representative who has indicated his/her understanding and acceptance.   Dental advisory given  Plan Discussed with: CRNA  Anesthesia Plan Comments: (Plan TIVA -GETA as needed )        Anesthesia Quick Evaluation

## 2018-08-30 ENCOUNTER — Encounter (HOSPITAL_COMMUNITY): Payer: Self-pay | Admitting: Cardiology

## 2018-09-04 ENCOUNTER — Ambulatory Visit: Payer: Medicare Other | Admitting: Allergy & Immunology
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
## 2018-09-13 ENCOUNTER — Ambulatory Visit (INDEPENDENT_AMBULATORY_CARE_PROVIDER_SITE_OTHER): Payer: Medicare Other | Admitting: Psychiatry

## 2018-09-13 ENCOUNTER — Encounter (HOSPITAL_COMMUNITY): Payer: Self-pay | Admitting: Psychiatry

## 2018-09-13 ENCOUNTER — Encounter (INDEPENDENT_AMBULATORY_CARE_PROVIDER_SITE_OTHER): Payer: Self-pay

## 2018-09-13 VITALS — BP 152/84 | HR 82 | Ht 64.0 in | Wt 248.0 lb

## 2018-09-13 DIAGNOSIS — F3131 Bipolar disorder, current episode depressed, mild: Secondary | ICD-10-CM | POA: Diagnosis not present

## 2018-09-13 DIAGNOSIS — F1021 Alcohol dependence, in remission: Secondary | ICD-10-CM | POA: Diagnosis not present

## 2018-09-13 MED ORDER — VALBENAZINE TOSYLATE 40 MG PO CAPS: 40.0000 mg | ORAL_CAPSULE | Freq: Every day | ORAL | 1 refills | Status: DC

## 2018-09-13 MED ORDER — ARIPIPRAZOLE 5 MG PO TABS: 5.0000 mg | ORAL_TABLET | Freq: Every evening | ORAL | 0 refills | Status: DC

## 2018-09-13 NOTE — Patient Instructions (Addendum)
1. Continue Abilify 5 mg daily  2. Continnue depakote 3000 mg at night 3. Continue lamotrigine 50 mg daily  4. Start vanbenazine 40 mg daily  5. Return to clinic in two months for 30 mins

## 2018-09-14 ENCOUNTER — Ambulatory Visit (INDEPENDENT_AMBULATORY_CARE_PROVIDER_SITE_OTHER): Payer: Medicare Other | Admitting: Allergy & Immunology

## 2018-09-14 ENCOUNTER — Encounter: Payer: Self-pay | Admitting: Allergy & Immunology

## 2018-09-14 VITALS — BP 116/70 | HR 71 | Resp 12

## 2018-09-14 DIAGNOSIS — J4540 Moderate persistent asthma, uncomplicated: Secondary | ICD-10-CM | POA: Diagnosis not present

## 2018-09-14 DIAGNOSIS — F319 Bipolar disorder, unspecified: Secondary | ICD-10-CM

## 2018-09-14 DIAGNOSIS — F3131 Bipolar disorder, current episode depressed, mild: Secondary | ICD-10-CM | POA: Diagnosis not present

## 2018-09-14 DIAGNOSIS — J310 Chronic rhinitis: Secondary | ICD-10-CM | POA: Diagnosis not present

## 2018-09-14 DIAGNOSIS — J479 Bronchiectasis, uncomplicated: Secondary | ICD-10-CM | POA: Diagnosis not present

## 2018-09-14 DIAGNOSIS — I480 Paroxysmal atrial fibrillation: Secondary | ICD-10-CM

## 2018-09-14 DIAGNOSIS — R918 Other nonspecific abnormal finding of lung field: Secondary | ICD-10-CM | POA: Diagnosis not present

## 2018-09-14 DIAGNOSIS — D839 Common variable immunodeficiency, unspecified: Secondary | ICD-10-CM

## 2018-09-14 NOTE — Progress Notes (Signed)
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 Spade is doing fairly well from an infectious standpoint as well as a pulmonary standpoint. Her spirometry is fairly stable. She does have some shortness of breath today, which is likely related to her atrial fibrillation. Evidently she has had an ablation performed but this was unsuccessful. She has also been diagnosed with an essential tremor, making a CNS infiltrative immunodeficiency unlikely (at the last visit, I had considered CTLA4 deficiency and others). In any case, we will not make any changes to her immunoglobulin replacement at this time since her infections seem to be under good control. We are going to see her in close follow up and I did ask her to contact us with any changes at all. I will have Tammy - our Biologics Coordinator - reach out to discuss bills from [facility].   Plan/Recommendations:   1. Hypogammaglobulinemia - Continue with Cuvitru as you are doing.  - I am glad that everything id going well from an infectious stand point. - I will have Tammy reach out to [facility] to discuss your bill (she likes to yell at people!)  2. Chronic rhinitis   - Continue with nasal saline rinses.  - We can consider allergy testing in the future if needed.  3. Severe  persistent asthma, uncomplicated - Lung function looked excellent today, so I think the shortness of breath is related to your atrial fibrillation.  - We will not make any medication changes at this time.  - Daily controller medication(s): Dulera 200/6mcg two puffs twice daily with spacer and Spiriva 1.59mcg two puffs once daily - Prior to physical activity: ProAir 2 puffs 10-15 minutes before physical activity. - Rescue medications: ProAir 4 puffs every 4-6 hours as needed, albuterol nebulizer one vial every 4-6 hours as needed or DuoNeb nebulizer one vial every 4-6 hours as needed - Asthma control goals:  * Full participation in all desired activities (may need albuterol before activity) * Albuterol use two time or less a week on average (not counting use with activity) * Cough interfering with sleep two time or less a month * Oral steroids no more than once a year * No hospitalizations  4. Return in about 4 months (around 01/14/2019).  Subjective:   Meyah Corle is a 57 y.o. female presenting today for follow up of  Chief Complaint  Patient presents with  . Asthma    Shalia Bartko has a history of the following: Patient Active Problem List   Diagnosis Date Noted  . Atypical chest pain 08/10/2018  . Bipolar disorder ([place]) 08/10/2018  . Headache 08/10/2018  . H/O atrial flutter 08/10/2018  . OSA on CPAP 08/10/2018  . Anxiety 08/10/2018  . Uncontrolled type 2 diabetes mellitus with hyperglycemia ([place]) 03/13/2018  . Hypothyroidism 03/13/2018  . Essential hypertension, benign 03/13/2018  . Mixed hyperlipidemia 03/13/2018  . Hypogammaglobulinemia ([place]) 02/06/2018  . Non-allergic rhinitis 02/06/2018  .  Severe persistent asthma, uncomplicated 76/28/3151  . Pulmonary nodules 02/06/2018  . Bronchiectasis without complication ([place]) 76/16/0737  . DM type 2 causing vascular disease ([place]) 02/06/2018    History obtained from: chart review and patient.  [facility]  Provider is [name], MD.    PCP: Dr. Meade Maw Psychiatrist: Dr. [name] Cardiologist: Dr. Kate Sable Endocrinologist: Dr. Loni Beckwith Pulmonologist: Dr. Simonne Maffucci Orthopedic Surgeon:Dr. Idalis Hoelting is a 57 y.o. female presenting for a follow up visit. Ms. Bugh has a rather complicated past medical history.  She was first seen in April 2019.  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  Her labs firm common variable immune deficiency with a low IgG and a borderline low IgA.  Her response to vaccines is certainly insufficient, warranting her placement on placement immunoglobulin in 2017.  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 She has not been placed on a biologic for her asthma (IgE <2 and there is no evidence of eosinophilia).   At that last visit, we continued her Cuvitru every two weeks. Her lung function did not look great but improved with the albuterol nebulizer treatment. We started her on 30mg  prednisone BID for her breathing. I recommended using her albuterol nebulizer prior to using the flutter valve to help with mucous clearance. Thankfully we did not need to give an antibiotic at the last visit.   In the interim, she has done fairly well. She did have some problems with her glucose following the prednisone course we started her on at the last visit. Her glucose went up into the 400-500 range. She actually presented to the ED with atypical chest pain with a hemoglobin A1c of 6.9. In any case, this is now under better control. She mostly handled this through her PCP - Dr. Wende Neighbors.   Her infectious history has remained stable. She does not think that she has needed an antibiotic at all since the last visit. Her infusions are going well and she is happy with how well she is doing them on her own in her house. She has had no side effects whatsoever.   She has been evaluated by neurology  for her tremor, which has tentatively been diagnosed as an essential tremor, per the patient. However review of the Neurology note shows that medications were considered to be the cause.Marland Kitchen She was recently started on Ingrezza to target resting tremors and tardive dyskinesia. She remains on Abilify for her bipolar disorder as well as depakote and lamotrigine.   Otherwise, there have been no changes to her past medical history, surgical history, family history, or social history.    Review of Systems: a 14-point review of systems is pertinent for what is mentioned in HPI.  Otherwise, all other systems were negative.  Constitutional: negative other than that listed in the HPI Eyes: negative other than that listed in the HPI Ears, nose, mouth, throat, and face: negative other than that listed in the HPI Respiratory: negative other than that listed in the HPI Cardiovascular: negative other than that listed in the HPI Gastrointestinal: negative other than that listed in the HPI Genitourinary: negative other than that listed in the HPI Integument: negative other than that listed in the HPI Hematologic: negative other than that listed in the HPI Musculoskeletal: negative other than that listed in the HPI Neurological: negative other than that listed in the HPI Allergy/Immunologic: negative other  than that listed in the HPI    Objective:   Blood pressure 116/70, pulse 71, resp. rate 12, SpO2 95 %. There is no height or weight on file to calculate BMI.   Physical Exam:  General: Alert, interactive, in no acute distress. Eyes: No conjunctival injection bilaterally, no discharge on the right, no discharge on the left and no Horner-Trantas dots present. PERRL bilaterally. EOMI without pain. No photophobia.  Ears: Right TM pearly gray with normal light reflex, Left TM pearly gray with normal light reflex, Right TM intact without perforation and Left TM intact without perforation.  Nose/Throat:  External nose within normal limits and septum midline. Turbinates edematous and pale with clear discharge. Posterior oropharynx erythematous without cobblestoning in the posterior oropharynx. Tonsils 2+ without exudates.  Tongue without thrush. Lungs: Clear to auscultation without wheezing, rhonchi or rales. No increased work of breathing. CV: Irregularly irregular rythm. No murmurs. Capillary refill <2 seconds.  Skin: Warm and dry, without lesions or rashes. Neuro:   Grossly intact. No focal deficits appreciated. Responsive to questions.  Diagnostic studies:   Spirometry: results normal (FEV1: 1.61/68%, FVC: 2.23/70%, FEV1/FVC: 72%).    Spirometry consistent with normal pattern.   Allergy Studies: none      Salvatore Marvel, MD  [facility]

## 2018-09-14 NOTE — Patient Instructions (Addendum)
1. Hypogammaglobulinemia - Continue with Cuvitru as you are doing.  - I am glad that everything id going well from an infectious stand point. - I will have Tammy reach out to [facility] to discuss your bill (she likes to yell at people!)  2. Chronic rhinitis   - Continue with nasal saline rinses.  - We can consider allergy testing in the future if needed.  3. Severe persistent asthma, uncomplicated - Lung function looked excellent today, so I think the shortness of breath is related to your atrial fibrillation.  - We will not make any medication changes at this time.  - Daily controller medication(s): Dulera 200/52mcg two puffs twice daily with spacer and Spiriva 1.25mcg two puffs once daily - Prior to physical activity: ProAir 2 puffs 10-15 minutes before physical activity. - Rescue medications: ProAir 4 puffs every 4-6 hours as needed, albuterol nebulizer one vial every 4-6 hours as needed or DuoNeb nebulizer one vial every 4-6 hours as needed - Asthma control goals:  * Full participation in all desired activities (may need albuterol before activity) * Albuterol use two time or less a week on average (not counting use with activity) * Cough interfering with sleep two time or less a month * Oral steroids no more than once a year * No hospitalizations  4. Return in about 4 months (around 01/14/2019).   Please inform us of any Emergency Department visits, hospitalizations, or changes in symptoms. Call us before going to the ED for breathing or allergy symptoms since we might be able to fit you in for a sick visit. Feel free to contact us anytime with any questions, problems, or concerns.  It was a pleasure to see you again today!  Here is my work cell if you need me: [phone]  Websites that have reliable patient information: 1. American Academy of Asthma, Allergy, and Immunology: www.aaaai.org 2. Food Allergy Research and Education (FARE): foodallergy.org 3. Mothers of Asthmatics:  http://www.asthmacommunitynetwork.org 4. American College of Allergy, Asthma, and Immunology: MonthlyElectricBill.co.uk   Make sure you are registered to vote! If you have moved or changed any of your contact information, you will need to get this updated before voting!

## 2018-09-15 LAB — VALPROIC ACID LEVEL: Valproic Acid Lvl: 89.6 mg/L (ref 50.0–100.0)

## 2018-09-17 ENCOUNTER — Encounter: Payer: Self-pay | Admitting: Allergy & Immunology

## 2018-09-18 ENCOUNTER — Other Ambulatory Visit (HOSPITAL_COMMUNITY): Payer: Self-pay | Admitting: Psychiatry

## 2018-09-18 ENCOUNTER — Telehealth (HOSPITAL_COMMUNITY): Payer: Self-pay | Admitting: *Deleted

## 2018-09-18 MED ORDER — VALBENAZINE TOSYLATE 40 MG PO CAPS: 40.0000 mg | ORAL_CAPSULE | Freq: Every day | ORAL | 1 refills | Status: DC

## 2018-09-18 NOTE — Telephone Encounter (Signed)
[facility] received Approval for Ingrezza  09/18/18  09/2019

## 2018-09-21 ENCOUNTER — Encounter: Payer: Self-pay | Admitting: Student

## 2018-09-21 ENCOUNTER — Ambulatory Visit (INDEPENDENT_AMBULATORY_CARE_PROVIDER_SITE_OTHER): Payer: Medicare Other | Admitting: Student

## 2018-09-21 ENCOUNTER — Ambulatory Visit (INDEPENDENT_AMBULATORY_CARE_PROVIDER_SITE_OTHER): Payer: Medicare Other

## 2018-09-21 VITALS — BP 126/76 | HR 86 | Ht 64.0 in | Wt 251.0 lb

## 2018-09-21 DIAGNOSIS — E785 Hyperlipidemia, unspecified: Secondary | ICD-10-CM

## 2018-09-21 DIAGNOSIS — Z7901 Long term (current) use of anticoagulants: Secondary | ICD-10-CM | POA: Diagnosis not present

## 2018-09-21 DIAGNOSIS — I4892 Unspecified atrial flutter: Secondary | ICD-10-CM

## 2018-09-21 DIAGNOSIS — E119 Type 2 diabetes mellitus without complications: Secondary | ICD-10-CM

## 2018-09-21 DIAGNOSIS — Z794 Long term (current) use of insulin: Secondary | ICD-10-CM | POA: Diagnosis not present

## 2018-09-21 DIAGNOSIS — R002 Palpitations: Secondary | ICD-10-CM

## 2018-09-21 DIAGNOSIS — I10 Essential (primary) hypertension: Secondary | ICD-10-CM | POA: Diagnosis not present

## 2018-09-21 DIAGNOSIS — Z8774 Personal history of (corrected) congenital malformations of heart and circulatory system: Secondary | ICD-10-CM

## 2018-09-21 DIAGNOSIS — IMO0001 Reserved for inherently not codable concepts without codable children: Secondary | ICD-10-CM

## 2018-09-21 NOTE — Progress Notes (Signed)
Cardiology Office Note    Date:  09/21/2018   ID:  Laura [name], DOB 1961/08/21, MRN [id number]  PCP:  [name], MD  Cardiologist: Kate Sable, MD    Chief Complaint  Patient presents with  . Follow-up    s/p DCCV     History of Present Illness:    Laura [name] is a 57 y.o. female with past medical history of PAF (s/p DCCV in early 2000's - recurrent atrial flutter in 07/2018), prior ASD repair (at 57 years of age), IDDM, HTN, OSA, severe persistent asthma, and Bipolar Disorder who presents to the office today for 6-week follow-up.  She was most recently evaluated by myself on 08/08/2018 is follow-up for recent ED visit in which she was found to be in atrial flutter. HR was overall well-controlled on Cardizem CD but given that this was her first known recurrence of atrial flutter since a DCCV 10+ years ago, a repeat cardioversion was recommended for attempted restoration of NSR.  In the interim, she was admitted to [facility] on 08/10/2018 for evaluation of sharp pains along her left pectoral region and hearing a "popping sensation" under her left breast. Pain was overall felt to be atypical for a cardiac etiology but she was admitted for rule out.  Cyclic troponin values remained negative and EKG showed no acute ischemic changes.  Echocardiogram was obtained and showed a preserved EF of 65 to 70% with no regional wall motion abnormalities. Was noted to have severe LVH and mildly dilated left atrium.  Was discharged home and informed to follow-up with Cardiology as an outpatient.  She did present to [facility] on 08/29/2018 for planned DCCV and successfully converted to normal sinus rhythm with a single synchronized 200 J shock.  In talking with the patient today, she reports that she started to experience occasional palpitations starting on Thanksgiving day. She is unsure if she has been having episodes of atrial fibrillation or not but reports feeling a "fluttering"  sensation in her chest at times which only lasts for a few minutes then spontaneously resolves. Denies any tachypalpitations. No associated dyspnea or chest pain. She denies any recent orthopnea, PND, or lower extremity edema.  Reports good compliance with Eliquis and denies missing any recent doses. No recent melena, hematochezia, or hematuria.  Past Medical History:  Diagnosis Date  . Asthma   . Atrial fibrillation ([place])   . Bipolar disorder ([place])   . Bronchiectasis ([place])   . CHF (congestive heart failure) ([place])   . Complication of anesthesia    "my Dr in [place] told me I had an allergic reaction to the combination of the pain meds and the anesthesia" she does not remember what happened but "I eneded up in the ICU for 5 days".  . Hypogammaglobulinemia ([place])   . Hypothyroid   . 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 Neuropathy   . Osteoarthritis   . PTSD (post-traumatic stress disorder)   . Recurrent sinusitis   . Short-term memory loss   . Sleep apnea   . TIA (transient ischemic attack)   . Tremors of nervous system    seeing PCP for this.  . Type 2 diabetes mellitus ([place])   . Wound infection     Past Surgical History:  Procedure Laterality Date  . 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 CARDIAC SURGERY    . CARDIOVERSION    . CARDIOVERSION N/A 08/29/2018   Procedure: CARDIOVERSION;  Surgeon: Arnoldo Lenis, MD;  Location: [facility]  ENDO SUITE;  Service: Endoscopy;  Laterality: N/A;  . [place]  . NASAL SINUS SURGERY  2017    Current Medications: Outpatient Medications Prior to Visit  Medication Sig Dispense Refill  . acetaminophen (TYLENOL) 325 MG tablet Take 2 tablets (650 mg total) by mouth every 6 (six) hours as needed for mild pain, fever or headache. 30 tablet 1  . Albuterol Sulfate (PROAIR RESPICLICK) 259 (90 Base) MCG/ACT AEPB Inhale 1 puff into the lungs every 6 (six) hours as needed (for shortness of breath/wheezing).     . ALPRAZolam (XANAX) 0.5 MG tablet Take 0.5 mg  by mouth daily as needed for anxiety.   0  . apixaban (ELIQUIS) 5 MG TABS tablet Take 5 mg by mouth 2 (two) times daily.    . ARIPiprazole (ABILIFY) 5 MG tablet Take 1 tablet (5 mg total) by mouth every evening. 90 tablet 0  . atorvastatin (LIPITOR) 10 MG tablet Take 10 mg by mouth every evening.     . diltiazem (CARDIZEM CD) 300 MG 24 hr capsule Take 300 mg by mouth every morning.     . divalproex (DEPAKOTE ER) 500 MG 24 hr tablet Take 1,500 mg by mouth 2 (two) times daily.    . Dulaglutide (TRULICITY) 1.5 DG/3.8VF SOPN Inject 1.5 mg into the skin every Sunday.     . furosemide (LASIX) 20 MG tablet Take 20-40 mg by mouth See admin instructions. Take 2 tablets (40mg  total) every morning and 1 tablet (20mg  total)  at noon.    . gabapentin (NEURONTIN) 600 MG tablet Take 600 mg by mouth 3 (three) times daily.    Marland [place] HUMALOG KWIKPEN 100 UNIT/ML KwikPen     . hydrALAZINE (APRESOLINE) 25 MG tablet Take 25 mg by mouth 3 (three) times daily.    . hydrochlorothiazide (HYDRODIURIL) 25 MG tablet Take 25 mg by mouth every morning.     . hydrOXYzine (ATARAX/VISTARIL) 25 MG tablet Take 1 tablet (25 mg total) by mouth every 6 (six) hours as needed for anxiety or nausea. 30 tablet 0  . Immune Globulin, Human, (CUVITRU) 4 GM/20ML SOLN Inject 100 mLs into the skin every 14 (fourteen) days.    . insulin glargine (LANTUS) 100 UNIT/ML injection Inject 60 Units into the skin at bedtime.    . lamoTRIgine (LAMICTAL) 100 MG tablet Take 50 mg by mouth at bedtime.     Marland [place] levothyroxine (SYNTHROID, LEVOTHROID) 112 MCG tablet Take 112 mcg by mouth daily before breakfast.    . lisinopril (PRINIVIL,ZESTRIL) 40 MG tablet Take 40 mg by mouth every morning.     . Melatonin 3 MG TABS Take 3 mg by mouth at bedtime.    . metFORMIN (GLUCOPHAGE-XR) 500 MG 24 hr tablet Take 1,000 mg by mouth 2 (two) times daily.   0  . mometasone-formoterol (DULERA) 200-5 MCG/ACT AERO Inhale 2 puffs into the lungs 2 (two) times daily. 1 Inhaler 0  .  Spacer/Aero-Holding Chambers (AEROCHAMBER MV) inhaler Use as instructed 1 each 0  . tiotropium (SPIRIVA) 18 MCG inhalation capsule Place 18 mcg into inhaler and inhale daily as needed (for breathing).     [place] Tosylate (INGREZZA) 40 MG CAPS Take 40 mg by mouth daily. 30 capsule 1   No facility-administered medications prior to visit.      Allergies:   Ativan [lorazepam] and Phenergan [promethazine hcl]   Social History   Socioeconomic History  . Marital status: Widowed    Spouse name: Not on  file  . Number of children: Not on file  . Years of education: Not on file  . Highest education level: Not on file  Occupational History  . Not on file  Social Needs  . Financial resource strain: Not on file  . Food insecurity:    Worry: Not on file    Inability: Not on file  . Transportation needs:    Medical: Not on file    Non-medical: Not on file  Tobacco Use  . Smoking status: Former Smoker    Packs/day: 1.00    Years: 17.00    Pack years: 17.00    Types: Cigarettes    Last attempt to quit: 2008    Years since quitting: 11.9  . Smokeless tobacco: Never Used  Substance and Sexual Activity  . Alcohol use: Not Currently  . Drug use: Not Currently  . Sexual activity: Yes    Birth control/protection: None  Lifestyle  . Physical activity:    Days per week: Not on file    Minutes per session: Not on file  . Stress: Not on file  Relationships  . Social connections:    Talks on phone: Not on file    Gets together: Not on file    Attends religious service: Not on file    Active member of club or organization: Not on file    Attends meetings of clubs or organizations: Not on file    Relationship status: Not on file  Other Topics Concern  . Not on file  Social History Narrative  . Not on file     Family History:  The patient's family history includes Alcohol abuse in her mother; Bipolar disorder in her sister; Celiac disease in her daughter; Hashimoto's thyroiditis in  her daughter; Heart attack in her mother; Heart disease in her brother and maternal aunt; Hypertension in her mother and sister; Kidney disease in her mother and sister; Stroke in her maternal aunt and mother; Thyroid disease in her daughter, daughter, daughter, and sister.   Review of Systems:   Please see the history of present illness.     General:  No chills, fever, night sweats or weight changes.  Cardiovascular:  No chest pain, dyspnea on exertion, edema, orthopnea,  paroxysmal nocturnal dyspnea. Positive for palpitations.  Dermatological: No rash, lesions/masses Respiratory: No cough, dyspnea Urologic: No hematuria, dysuria Abdominal:   No nausea, vomiting, diarrhea, bright red blood per rectum, melena, or hematemesis Neurologic:  No visual changes, wkns, changes in mental status. All other systems reviewed and are otherwise negative except as noted above.   Physical Exam:    VS:  BP 126/76 (BP Location: Right Arm)   Pulse 86   Ht 5\' 4"  (1.626 m)   Wt 251 lb (113.9 kg)   SpO2 96%   BMI 43.08 kg/m    General: Well developed, well nourished Caucasian female appearing in no acute distress. Head: Normocephalic, atraumatic, sclera non-icteric, no xanthomas, nares are without discharge.  Neck: No carotid bruits. JVD not elevated.  Lungs: Respirations regular and unlabored, without wheezes or rales.  Heart: Regular rate and rhythm. No S3 or S4.  No murmur, no rubs, or gallops appreciated. Abdomen: Soft, non-tender, non-distended with normoactive bowel sounds. No hepatomegaly. No rebound/guarding. No obvious abdominal masses. Msk:  Strength and tone appear normal for age. No joint deformities or effusions. Extremities: No clubbing or cyanosis. No lower extremity edema.  Distal pedal pulses are 2+ bilaterally. Neuro: Alert and oriented X 3. Moves all  extremities spontaneously. No focal deficits noted. Psych:  Responds to questions appropriately with a normal affect. Skin: No rashes or  lesions noted  Wt Readings from Last 3 Encounters:  09/21/18 251 lb (113.9 kg)  08/24/18 242 lb 8.1 oz (110 kg)  08/11/18 242 lb 8.1 oz (110 kg)     Studies/Labs Reviewed:   EKG:  EKG is ordered today. The ekg ordered today demonstrates NSR, HR 86, with first-degree AV block. No acute ST changes when compared to prior tracings.  Recent Labs: 03/13/2018: ALT 8; TSH 1.00 08/10/2018: B Natriuretic Peptide 68.0 08/24/2018: BUN 22; Creatinine, Ser 0.64; Hemoglobin 14.5; Platelets 169; Potassium 3.9; Sodium 138   Lipid Panel    Component Value Date/Time   CHOL 111 08/11/2018 0213   TRIG 165 (H) 08/11/2018 0213   HDL 32 (L) 08/11/2018 0213   CHOLHDL 3.5 08/11/2018 0213   VLDL 33 08/11/2018 0213   LDLCALC 46 08/11/2018 0213    Additional studies/ records that were reviewed today include:   Echocardiogram: 08/2018 Study Conclusions  - Procedure narrative: Transthoracic echocardiography. Image   quality was poor. The study was technically difficult, as a   result of poor sound wave transmission and body habitus.   Intravenous contrast (Definity) was administered. - Left ventricle: The cavity size was normal. There was severe   concentric hypertrophy. Systolic function was vigorous. The   estimated ejection fraction was in the range of 65% to 70%. No   thrombus noted with Definity contrast. Wall motion was normal;   there were no regional wall motion abnormalities. The study is   not technically sufficient to allow evaluation of LV diastolic   function. LV filling pressure is elevated. - Aortic valve: Calcified. poorly visualized leaflets. No stenosis. - Mitral valve: Calcified annulus. Mildly thickened leaflets . - Left atrium: The atrium was mildly dilated. - Inferior vena cava: The vessel was dilated. The respirophasic   diameter changes were blunted (< 50%), consistent with elevated   central venous pressure.  Impressions:  - Technically difficult study. Definity  contrast given. LVEF   65-70%, severe LVH, normal wall motion, indeterminate diastolic   function with elevated LV filling pressure, mild LAE, dilated   IVC.   Assessment:    1. Atrial flutter with controlled response ([place])   2. Heart palpitations   3. Current use of long term anticoagulation   4. Essential hypertension   5. Hyperlipidemia LDL goal <70   6. H/O congenital atrial septal defect (ASD) repair   7. IDDM (insulin dependent diabetes mellitus) ([place])      Plan:   In order of problems listed above:  1. Paroxysmal Atrial Flutter with history of Atrial Fibrillation/ Palpitations - She has a history of atrial fibrillation and underwent DCCV 10+ years ago but was found to be in recurrent atrial flutter in 07/2018. Repeat DCCV was performed on 08/29/2018 and she is maintaining normal sinus rhythm by EKG today but she does report intermittent palpitations at times and is concerned about a recurrence. Her symptoms are only lasting for a few minutes and then spontaneously resolving. She denies any associated chest pain, dyspnea, or dizziness when this occurs. - Will plan to obtain a cardiac event monitor to rule out any recurrence of atrial fibrillation/flutter. If found to have recurrence, would recommend referral to EP for discussion of antiarrhythmics versus possible ablation. - Continue Cardizem CD 300 mg daily for rate control. Remains on Eliquis 5 mg twice daily for anticoagulation.  2. HTN -  BP is well-controlled at 126/76 during today's visit. Continue Cardizem CD 300 mg daily, Hydralazine 25 mg TID, HCTZ 25 mg daily, and Lisinopril 40 mg daily.  3. HLD - Followed by PCP. FLP in 08/2018 showed total cholesterol 111, triglycerides 165, HDL 32, and LDL 46. Continue Atorvastatin 10 mg daily.  4. History of ASD Repair - Echo in 08/2018 showed no significant shunting. Preserved EF at that time with severe LVH and no regional WMA.   5. IDDM - Hgb A1c at 6.9 in 03/2018.  Followed by PCP.   Medication Adjustments/Labs and Tests Ordered: Current medicines are reviewed at length with the patient today.  Concerns regarding medicines are outlined above.  Medication changes, Labs and Tests ordered today are listed in the Patient Instructions below. Patient Instructions  Medication Instructions:  Your physician recommends that you continue on your current medications as directed. Please refer to the Current Medication list given to you today.  If you need a refill on your cardiac medications before your next appointment, please call your pharmacy.   Lab work: NONE  If you have labs (blood work) drawn today and your tests are completely normal, you will receive your results only by: [place] MyChart Message (if you have MyChart) OR . A paper copy in the mail If you have any lab test that is abnormal or we need to change your treatment, we will call you to review the results.  Testing/Procedures: Your physician has recommended that you wear an event monitor. Event monitors are medical devices that record the heart's electrical activity. Doctors most often [place] these monitors to diagnose arrhythmias. Arrhythmias are problems with the speed or rhythm of the heartbeat. The monitor is a small, portable device. You can wear one while you do your normal daily activities. This is usually used to diagnose what is causing palpitations/syncope (passing out).    Follow-Up: At [facility], you and your health needs are our priority.  As part of our continuing mission to provide you with exceptional heart care, we have created designated Provider Care Teams.  These Care Teams include your primary Cardiologist (physician) and Advanced Practice Providers (APPs -  Physician Assistants and Nurse Practitioners) who all work together to provide you with the care you need, when you need it. You will need a follow up appointment in 3 months.  Please call our office 2 months in advance to  schedule this appointment.  You may see Kate Sable, MD or one of the following Advanced Practice Providers on your designated Care Team:   Bernerd Pho, PA-C [facility]) . Ermalinda Barrios, PA-C ([place])  Any Other Special Instructions Will Be Listed Below (If Applicable). Thank you for choosing [facility]!     Signed, Erma Heritage, PA-C  09/21/2018 5:02 PM    [facility] [address] [place], [place] [address] Phone: [phone]

## 2018-09-21 NOTE — Patient Instructions (Signed)
Medication Instructions:  Your physician recommends that you continue on your current medications as directed. Please refer to the Current Medication list given to you today.  If you need a refill on your cardiac medications before your next appointment, please call your pharmacy.   Lab work: NONE  If you have labs (blood work) drawn today and your tests are completely normal, you will receive your results only by: Marland Kitchen MyChart Message (if you have MyChart) OR . A paper copy in the mail If you have any lab test that is abnormal or we need to change your treatment, we will call you to review the results.  Testing/Procedures: Your physician has recommended that you wear an event monitor. Event monitors are medical devices that record the heart's electrical activity. Doctors most often [place] these monitors to diagnose arrhythmias. Arrhythmias are problems with the speed or rhythm of the heartbeat. The monitor is a small, portable device. You can wear one while you do your normal daily activities. This is usually used to diagnose what is causing palpitations/syncope (passing out).    Follow-Up: At [facility], you and your health needs are our priority.  As part of our continuing mission to provide you with exceptional heart care, we have created designated Provider Care Teams.  These Care Teams include your primary Cardiologist (physician) and Advanced Practice Providers (APPs -  Physician Assistants and Nurse Practitioners) who all work together to provide you with the care you need, when you need it. You will need a follow up appointment in 3 months.  Please call our office 2 months in advance to schedule this appointment.  You may see Kate Sable, MD or one of the following Advanced Practice Providers on your designated Care Team:   Bernerd Pho, PA-C [facility]) . Ermalinda Barrios, PA-C ([place])  Any Other Special Instructions Will Be Listed Below (If  Applicable). Thank you for choosing [facility]!

## 2018-10-11 ENCOUNTER — Other Ambulatory Visit (HOSPITAL_COMMUNITY): Payer: Self-pay | Admitting: *Deleted

## 2018-10-11 DIAGNOSIS — D801 Nonfamilial hypogammaglobulinemia: Secondary | ICD-10-CM

## 2018-10-12 ENCOUNTER — Inpatient Hospital Stay (HOSPITAL_COMMUNITY): Payer: Medicare Other | Attending: Hematology

## 2018-10-12 DIAGNOSIS — Z87891 Personal history of nicotine dependence: Secondary | ICD-10-CM | POA: Diagnosis not present

## 2018-10-12 DIAGNOSIS — M1990 Unspecified osteoarthritis, unspecified site: Secondary | ICD-10-CM | POA: Diagnosis not present

## 2018-10-12 DIAGNOSIS — E039 Hypothyroidism, unspecified: Secondary | ICD-10-CM | POA: Diagnosis not present

## 2018-10-12 DIAGNOSIS — Z7901 Long term (current) use of anticoagulants: Secondary | ICD-10-CM | POA: Diagnosis not present

## 2018-10-12 DIAGNOSIS — J479 Bronchiectasis, uncomplicated: Secondary | ICD-10-CM | POA: Insufficient documentation

## 2018-10-12 DIAGNOSIS — Z794 Long term (current) use of insulin: Secondary | ICD-10-CM | POA: Diagnosis not present

## 2018-10-12 DIAGNOSIS — I509 Heart failure, unspecified: Secondary | ICD-10-CM | POA: Diagnosis not present

## 2018-10-12 DIAGNOSIS — D801 Nonfamilial hypogammaglobulinemia: Secondary | ICD-10-CM

## 2018-10-12 DIAGNOSIS — E1140 Type 2 diabetes mellitus with diabetic neuropathy, unspecified: Secondary | ICD-10-CM | POA: Insufficient documentation

## 2018-10-12 DIAGNOSIS — J4550 Severe persistent asthma, uncomplicated: Secondary | ICD-10-CM | POA: Insufficient documentation

## 2018-10-12 DIAGNOSIS — Z79899 Other long term (current) drug therapy: Secondary | ICD-10-CM | POA: Diagnosis not present

## 2018-10-12 DIAGNOSIS — Z8673 Personal history of transient ischemic attack (TIA), and cerebral infarction without residual deficits: Secondary | ICD-10-CM | POA: Diagnosis not present

## 2018-10-12 DIAGNOSIS — I4891 Unspecified atrial fibrillation: Secondary | ICD-10-CM | POA: Diagnosis not present

## 2018-10-12 LAB — COMPREHENSIVE METABOLIC PANEL
ALT: 18 U/L (ref 0–44)
AST: 20 U/L (ref 15–41)
Albumin: 3.8 g/dL (ref 3.5–5.0)
Alkaline Phosphatase: 71 U/L (ref 38–126)
Anion gap: 10 (ref 5–15)
BUN: 23 mg/dL — ABNORMAL HIGH (ref 6–20)
CO2: 26 mmol/L (ref 22–32)
Calcium: 9 mg/dL (ref 8.9–10.3)
Chloride: 102 mmol/L (ref 98–111)
Creatinine, Ser: 0.6 mg/dL (ref 0.44–1.00)
GFR calc Af Amer: >60 mL/min (ref >60)
GFR calc non Af Amer: >60 mL/min (ref >60)
Glucose, Bld: 150 mg/dL — ABNORMAL HIGH (ref 70–99)
Potassium: 4.5 mmol/L (ref 3.5–5.1)
Sodium: 138 mmol/L (ref 135–145)
Total Bilirubin: 0.6 mg/dL (ref 0.3–1.2)
Total Protein: 7.1 g/dL (ref 6.5–8.1)

## 2018-10-12 LAB — CBC WITH DIFFERENTIAL/PLATELET
Abs Immature Granulocytes: 0.06 10*3/uL (ref 0.00–0.07)
BASOS ABS: 0 10*3/uL (ref 0.0–0.1)
Basophils Relative: 1 %
Eosinophils Absolute: 0.1 10*3/uL (ref 0.0–0.5)
Eosinophils Relative: 1 %
HCT: 39.3 % (ref 36.0–46.0)
Hemoglobin: 13.2 g/dL (ref 12.0–15.0)
IMMATURE GRANULOCYTES: 1 %
Lymphocytes Relative: 44 %
Lymphs Abs: 1.9 10*3/uL (ref 0.7–4.0)
MCH: 32 pg (ref 26.0–34.0)
MCHC: 33.6 g/dL (ref 30.0–36.0)
MCV: 95.2 fL (ref 80.0–100.0)
Monocytes Absolute: 0.4 10*3/uL (ref 0.1–1.0)
Monocytes Relative: 9 %
NRBC: 0 % (ref 0.0–0.2)
Neutro Abs: 1.9 10*3/uL (ref 1.7–7.7)
Neutrophils Relative %: 44 %
Platelets: 170 10*3/uL (ref 150–400)
RBC: 4.13 MIL/uL (ref 3.87–5.11)
RDW: 12.6 % (ref 11.5–15.5)
WBC: 4.2 10*3/uL (ref 4.0–10.5)

## 2018-10-13 LAB — IGG, IGA, IGM
IgA: 39 mg/dL — ABNORMAL LOW (ref 87–352)
IgG (Immunoglobin G), Serum: 963 mg/dL (ref 700–1600)
IgM (Immunoglobulin M), Srm: 67 mg/dL (ref 26–217)

## 2018-10-15 ENCOUNTER — Encounter (HOSPITAL_COMMUNITY): Payer: Self-pay | Admitting: Hematology

## 2018-10-15 ENCOUNTER — Inpatient Hospital Stay (HOSPITAL_BASED_OUTPATIENT_CLINIC_OR_DEPARTMENT_OTHER): Payer: Medicare Other | Admitting: Hematology

## 2018-10-15 ENCOUNTER — Other Ambulatory Visit: Payer: Self-pay

## 2018-10-15 VITALS — BP 158/85 | HR 89 | Temp 98.2°F | Resp 18 | Wt 253.0 lb

## 2018-10-15 DIAGNOSIS — I4891 Unspecified atrial fibrillation: Secondary | ICD-10-CM

## 2018-10-15 DIAGNOSIS — M1990 Unspecified osteoarthritis, unspecified site: Secondary | ICD-10-CM | POA: Diagnosis not present

## 2018-10-15 DIAGNOSIS — Z7901 Long term (current) use of anticoagulants: Secondary | ICD-10-CM

## 2018-10-15 DIAGNOSIS — I509 Heart failure, unspecified: Secondary | ICD-10-CM

## 2018-10-15 DIAGNOSIS — Z79899 Other long term (current) drug therapy: Secondary | ICD-10-CM | POA: Diagnosis not present

## 2018-10-15 DIAGNOSIS — Z794 Long term (current) use of insulin: Secondary | ICD-10-CM

## 2018-10-15 DIAGNOSIS — J4550 Severe persistent asthma, uncomplicated: Secondary | ICD-10-CM

## 2018-10-15 DIAGNOSIS — D801 Nonfamilial hypogammaglobulinemia: Secondary | ICD-10-CM

## 2018-10-15 DIAGNOSIS — E1140 Type 2 diabetes mellitus with diabetic neuropathy, unspecified: Secondary | ICD-10-CM

## 2018-10-15 DIAGNOSIS — Z8673 Personal history of transient ischemic attack (TIA), and cerebral infarction without residual deficits: Secondary | ICD-10-CM

## 2018-10-15 DIAGNOSIS — J479 Bronchiectasis, uncomplicated: Secondary | ICD-10-CM

## 2018-10-15 DIAGNOSIS — Z87891 Personal history of nicotine dependence: Secondary | ICD-10-CM

## 2018-10-15 DIAGNOSIS — E039 Hypothyroidism, unspecified: Secondary | ICD-10-CM | POA: Diagnosis not present

## 2018-10-15 NOTE — Patient Instructions (Signed)
[facility] at [facility] Discharge Instructions     Thank you for choosing [facility] at [facility] to provide your oncology and hematology care.  To afford each patient quality time with our provider, please arrive at least 15 minutes before your scheduled appointment time.   If you have a lab appointment with the Cancer Center please come in thru the  Main Entrance and check in at the main information desk  You need to re-schedule your appointment should you arrive 10 or more minutes late.  We strive to give you quality time with our providers, and arriving late affects you and other patients whose appointments are after yours.  Also, if you no show three or more times for appointments you may be dismissed from the clinic at the providers discretion.     Again, thank you for choosing [facility].  Our hope is that these requests will decrease the amount of time that you wait before being seen by our physicians.       _____________________________________________________________  Should you have questions after your visit to  Cancer Center, please contact our office at [phone] between the hours of 8:00 a.m. and 4:30 p.m.  Voicemails left after 4:00 p.m. will not be returned until the following business day.  For prescription refill requests, have your pharmacy contact our office and allow 72 hours.    Cancer Center Support Programs:   > Cancer Support Group  2nd Tuesday of the month 1pm-2pm, Journey Room    

## 2018-10-15 NOTE — Progress Notes (Signed)
Laura [name], [place] [address]   CLINIC:  Medical Oncology/Hematology  PCP:  [name], MD [place] [address] [phone]   REASON FOR VISIT: Follow-up for hypogammaglobulinemia.  CURRENT THERAPY: IVIG  INTERVAL HISTORY:  Laura [name] 58 y.o. female returns for routine follow-up for hypogammaglobulinemia. She has no complaints at this time. She gives herself IVIG injections at home every 2 weeks. Denies any nausea, vomiting, or diarrhea. Denies any new pains. Had not noticed any recent bleeding such as epistaxis, hematuria or hematochezia. Denies recent chest pain on exertion, shortness of breath on minimal exertion, pre-syncopal episodes, or palpitations. Denies any numbness or tingling in hands or feet. Denies any recent fevers, infections, or recent hospitalizations. She reports her appetite and energy level at 100%.    REVIEW OF SYSTEMS:  Review of Systems  All other systems reviewed and are negative.    PAST MEDICAL/SURGICAL HISTORY:  Past Medical History:  Diagnosis Date  . Asthma   . Atrial fibrillation ([place])   . Bipolar disorder ([place])   . Bronchiectasis ([place])   . CHF (congestive heart failure) ([place])   . Complication of anesthesia    "my Dr in [place] told me I had an allergic reaction to the combination of the pain meds and the anesthesia" she does not remember what happened but "I eneded up in the ICU for 5 days".  . Hypogammaglobulinemia ([place])   . Hypothyroid   . 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 Neuropathy   . Osteoarthritis   . PTSD (post-traumatic stress disorder)   . Recurrent sinusitis   . Short-term memory loss   . Sleep apnea   . TIA (transient ischemic attack)   . Tremors of nervous system    seeing PCP for this.  . Type 2 diabetes mellitus ([employer])   . Wound infection    Past Surgical History:  Procedure Laterality Date  . 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 CARDIAC SURGERY    . CARDIOVERSION    .  CARDIOVERSION N/A 08/29/2018   Procedure: CARDIOVERSION;  Surgeon: Arnoldo Lenis, MD;  Location: [facility];  Service: Endoscopy;  Laterality: N/A;  . [place]  . NASAL SINUS SURGERY  2017     SOCIAL HISTORY:  Social History   Socioeconomic History  . Marital status: Widowed    Spouse name: Not on file  . Number of children: Not on file  . Years of education: Not on file  . Highest education level: Not on file  Occupational History  . Not on file  Social Needs  . Financial resource strain: Not on file  . Food insecurity:    Worry: Not on file    Inability: Not on file  . Transportation needs:    Medical: Not on file    Non-medical: Not on file  Tobacco Use  . Smoking status: Former Smoker    Packs/day: 1.00    Years: 17.00    Pack years: 17.00    Types: Cigarettes    Last attempt to quit: 2008    Years since quitting: 12.0  . Smokeless tobacco: Never Used  Substance and Sexual Activity  . Alcohol use: Not Currently  . Drug use: Not Currently  . Sexual activity: Yes    Birth control/protection: None  Lifestyle  . Physical activity:    Days per week: Not on file    Minutes per session: Not  on file  . Stress: Not on file  Relationships  . Social connections:    Talks on phone: Not on file    Gets together: Not on file    Attends religious service: Not on file    Active member of club or organization: Not on file    Attends meetings of clubs or organizations: Not on file    Relationship status: Not on file  . Intimate partner violence:    Fear of current or ex partner: Not on file    Emotionally abused: Not on file    Physically abused: Not on file    Forced sexual activity: Not on file  Other Topics Concern  . Not on file  Social History Narrative  . Not on file    FAMILY HISTORY:  Family History  Problem Relation Age of Onset  . Hypertension Mother   . Stroke Mother   . Kidney disease Mother   . Heart attack Mother    . Alcohol abuse Mother   . Kidney disease Sister   . Heart disease Brother   . Stroke Maternal Aunt   . Heart disease Maternal Aunt   . Hypertension Sister   . Bipolar disorder Sister   . Thyroid disease Sister   . Thyroid disease Daughter   . Thyroid disease Daughter   . Hashimoto's thyroiditis Daughter   . Celiac disease Daughter   . Thyroid disease Daughter     CURRENT MEDICATIONS:  Outpatient Encounter Medications as of 10/15/2018  Medication Sig Note  . acetaminophen (TYLENOL) 325 MG tablet Take 2 tablets (650 mg total) by mouth every 6 (six) hours as needed for mild pain, fever or headache.   . Albuterol Sulfate (PROAIR RESPICLICK) 166 (90 Base) MCG/ACT AEPB Inhale 1 puff into the lungs every 6 (six) hours as needed (for shortness of breath/wheezing).    Marland Kitchen apixaban (ELIQUIS) 5 MG TABS tablet Take 5 mg by mouth 2 (two) times daily.   . ARIPiprazole (ABILIFY) 5 MG tablet Take 1 tablet (5 mg total) by mouth every evening.   Marland Kitchen atorvastatin (LIPITOR) 10 MG tablet Take 10 mg by mouth every evening.    . diltiazem (CARDIZEM CD) 300 MG 24 hr capsule Take 300 mg by mouth every morning.    . divalproex (DEPAKOTE ER) 500 MG 24 hr tablet Take 1,500 mg by mouth 2 (two) times daily.   . Dulaglutide (TRULICITY) 1.5 AY/3.0ZS SOPN Inject 1.5 mg into the skin every Sunday.    . furosemide (LASIX) 20 MG tablet Take 20-40 mg by mouth See admin instructions. Take 2 tablets (40mg  total) every morning and 1 tablet (20mg  total)  at noon.   . gabapentin (NEURONTIN) 600 MG tablet Take 600 mg by mouth 3 (three) times daily.   Marland Kitchen HUMALOG KWIKPEN 100 UNIT/ML KwikPen    . hydrALAZINE (APRESOLINE) 25 MG tablet Take 25 mg by mouth 3 (three) times daily.   . hydrochlorothiazide (HYDRODIURIL) 25 MG tablet Take 25 mg by mouth every morning.    . hydrOXYzine (ATARAX/VISTARIL) 25 MG tablet Take 1 tablet (25 mg total) by mouth every 6 (six) hours as needed for anxiety or nausea.   . Immune Globulin, Human, (CUVITRU)  4 GM/20ML SOLN Inject 100 mLs into the skin every 14 (fourteen) days.   . insulin glargine (LANTUS) 100 UNIT/ML injection Inject 60 Units into the skin at bedtime.   . lamoTRIgine (LAMICTAL) 100 MG tablet Take 50 mg by mouth at bedtime.    Marland Kitchen  levothyroxine (SYNTHROID, LEVOTHROID) 112 MCG tablet Take 112 mcg by mouth daily before breakfast.   . lisinopril (PRINIVIL,ZESTRIL) 40 MG tablet Take 40 mg by mouth every morning.    . Melatonin 3 MG TABS Take 3 mg by mouth at bedtime.   . metFORMIN (GLUCOPHAGE-XR) 500 MG 24 hr tablet Take 1,000 mg by mouth 2 (two) times daily.    . mometasone-formoterol (DULERA) 200-5 MCG/ACT AERO Inhale 2 puffs into the lungs 2 (two) times daily.   Marland Kitchen Spacer/Aero-Holding Chambers (AEROCHAMBER MV) inhaler Use as instructed   . tiotropium (SPIRIVA) 18 MCG inhalation capsule Place 18 mcg into inhaler and inhale daily as needed (for breathing).  07/10/2018: Hasn't used in over  a month but keeps on hand.  [place] Tosylate (INGREZZA) 40 MG CAPS Take 40 mg by mouth daily.   Marland Kitchen ALPRAZolam (XANAX) 0.5 MG tablet Take 0.5 mg by mouth daily as needed for anxiety.     No facility-administered encounter medications on file as of 10/15/2018.     ALLERGIES:  Allergies  Allergen Reactions  . Ativan [Lorazepam] Hives  . Phenergan [Promethazine Hcl] Hives     PHYSICAL EXAM:  ECOG Performance status: 1  Vitals:   10/15/18 1500  BP: (!) 158/85  Pulse: 89  Resp: 18  Temp: 98.2 F (36.8 C)  SpO2: 98%   Filed Weights   10/15/18 1500  Weight: 253 lb (114.8 kg)    Physical Exam Constitutional:      Appearance: Normal appearance. She is normal weight.  Musculoskeletal: Normal range of motion.  Skin:    General: Skin is warm and dry.  Neurological:     Mental Status: She is alert and oriented to person, place, and time. Mental status is at baseline.  Psychiatric:        Mood and Affect: Mood normal.        Behavior: Behavior normal.        Thought Content: Thought  content normal.        Judgment: Judgment normal.    No palpable adenopathy.  No palpable splenomegaly or hepatomegaly.  LABORATORY DATA:  I have reviewed the labs as listed.  CBC    Component Value Date/Time   WBC 4.2 10/12/2018 1303   RBC 4.13 10/12/2018 1303   HGB 13.2 10/12/2018 1303   HGB 13.5 02/08/2018 0914   HCT 39.3 10/12/2018 1303   HCT 40.2 02/08/2018 0914   PLT 170 10/12/2018 1303   PLT 204 02/08/2018 0914   MCV 95.2 10/12/2018 1303   MCV 96 02/08/2018 0914   MCH 32.0 10/12/2018 1303   MCHC 33.6 10/12/2018 1303   RDW 12.6 10/12/2018 1303   RDW 15.0 02/08/2018 0914   LYMPHSABS 1.9 10/12/2018 1303   LYMPHSABS 1.6 02/08/2018 0914   MONOABS 0.4 10/12/2018 1303   EOSABS 0.1 10/12/2018 1303   EOSABS 0.0 02/08/2018 0914   BASOSABS 0.0 10/12/2018 1303   BASOSABS 0.0 02/08/2018 0914   CMP Latest Ref Rng & Units 10/12/2018 08/24/2018 08/10/2018  Glucose 70 - 99 mg/dL 150(H) 121(H) 107(H)  BUN 6 - 20 mg/dL 23(H) 22(H) 17  Creatinine 0.44 - 1.00 mg/dL 0.60 0.64 0.58  Sodium 135 - 145 mmol/L 138 138 139  Potassium 3.5 - 5.1 mmol/L 4.5 3.9 4.0  Chloride 98 - 111 mmol/L 102 99 101  CO2 22 - 32 mmol/L 26 25 26   Calcium 8.9 - 10.3 mg/dL 9.0 9.2 10.2  Total Protein 6.5 - 8.1 g/dL 7.1 - -  Total  Bilirubin 0.3 - 1.2 mg/dL 0.6 - -  Alkaline Phos 38 - 126 U/L 71 - -  AST 15 - 41 U/L 20 - -  ALT 0 - 44 U/L 18 - -       DIAGNOSTIC IMAGING:  I have independently reviewed the scans and discussed with the patient.   I have reviewed Francene Finders, NP's note and agree with the documentation.  I personally performed a face-to-face visit, made revisions and my assessment and plan is as follows.    ASSESSMENT & PLAN:   Hypogammaglobulinemia ([place]) 1.  Hypogammaglobulinemia: - Initially diagnosed in October 2017, was receiving IVIG once every 3 weeks in [place].  Recently moved to [place] area 2 months ago.  She sees Dr. Ernst Bowler and was started on Cuvitru 20 g every 2  weeks. 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 -She had one episode of influenza a and bronchiectasis exacerbation in 2019.  Also reports one sinus infection. - He does not have any B symptoms.  She does not have any palpable adenopathy or splenomegaly. -We reviewed her blood work.  Her IgG trough level was about 900.  She will continue Cuvitru every 2 weeks.  2.  Severe persistent asthma complicated by bronchiectasis: -She is continuing Dulera along with Spiriva.         Orders placed this encounter:  Orders Placed This Encounter  Procedures  . IgG, IgA, IgM  . CBC with Differential/Platelet  . Comprehensive metabolic panel      Derek Jack, MD [place] [phone]

## 2018-10-15 NOTE — Assessment & Plan Note (Signed)
1.  Hypogammaglobulinemia: - Initially diagnosed in October 2017, was receiving IVIG once every 3 weeks in [place].  Recently moved to [place] area 2 months ago.  She sees Dr. Ernst Bowler and was started on Cuvitru 20 g every 2 weeks. 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 -She had one episode of influenza a and bronchiectasis exacerbation in 2019.  Also reports one sinus infection. - He does not have any B symptoms.  She does not have any palpable adenopathy or splenomegaly. -We reviewed her blood work.  Her IgG trough level was about 900.  She will continue Cuvitru every 2 weeks.  2.  Severe persistent asthma complicated by bronchiectasis: -She is continuing Dulera along with Spiriva.

## 2018-10-24 ENCOUNTER — Ambulatory Visit (INDEPENDENT_AMBULATORY_CARE_PROVIDER_SITE_OTHER): Payer: Medicare Other | Admitting: Pulmonary Disease

## 2018-10-24 ENCOUNTER — Encounter: Payer: Self-pay | Admitting: Pulmonary Disease

## 2018-10-24 VITALS — BP 126/74 | HR 79 | Ht 64.0 in | Wt 253.0 lb

## 2018-10-24 DIAGNOSIS — R062 Wheezing: Secondary | ICD-10-CM | POA: Diagnosis not present

## 2018-10-24 DIAGNOSIS — R400 Somnolence: Secondary | ICD-10-CM

## 2018-10-24 DIAGNOSIS — J479 Bronchiectasis, uncomplicated: Secondary | ICD-10-CM

## 2018-10-24 DIAGNOSIS — G4733 Obstructive sleep apnea (adult) (pediatric): Secondary | ICD-10-CM | POA: Diagnosis not present

## 2018-10-24 LAB — NITRIC OXIDE: Nitric Oxide: 10

## 2018-10-24 MED ORDER — AEROCHAMBER MV MISC: 0 refills | Status: DC

## 2018-10-24 NOTE — Progress Notes (Signed)
Synopsis: Referred in 02/2018 for bronchiectasis.  She has a history of common variable immune deficiency as well as severe persistent asthma.  She also has paroxysmal atrial fibrillation and a history of congestive heart failure. She was diagnosed with common variable immunodeficiency based on a low serum IgG, borderline low IgA and abnormal responses to vaccination.  She started immunoglobulin replacement in 2017.  She has a history of recurrent sinopulmonary infections. She also has obstructive sleep apnea and asthma. Had PSG in 2007.  Has a history of sinus surgery in May 6222 but complicated by an allergic reaction to anesthesia. Also has a history of type 2 diabetes. She has moved around the [place] in the last few years.  She had ASD repair age 84 in [place] at [place].  Subjective:   PATIENT ID: Laura [name] GENDER: female DOB: [birth year], MRN: [id number]   HPI  Chief Complaint  Patient presents with  . Follow-up    pt has had increased wheezing, sob since cardioversion on 08/29/2018.    Anea says that her breathing has not been as good recently.  Specifically she says that she feels more short of breath with pretty much any activity and she has been using her albuterol several times a week.  She continues to take [facility] twice a day.  She uses Spiriva as well but she says she uses that more sporadically.  She has not had any mucus production and she does not feel chest congestion.  She has not had a fever or chills.  However, she says that she has gained quite a bit of weight over the last several weeks.  She has also been struggling with heart arrhythmia issues and has been through a cardioversion and recent most recently has been wearing a heart monitor.   Past Medical History:  Diagnosis Date  . Asthma   . Atrial fibrillation ([place])   . Bipolar disorder ([place])   . Bronchiectasis ([place])   . CHF (congestive heart failure) ([place])   . Complication of anesthesia    "my Dr in [place]  told me I had an allergic reaction to the combination of the pain meds and the anesthesia" she does not remember what happened but "I eneded up in the ICU for 5 days".  . Hypogammaglobulinemia ([place])   . Hypothyroid   . 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 Neuropathy   . Osteoarthritis   . PTSD (post-traumatic stress disorder)   . Recurrent sinusitis   . Short-term memory loss   . Sleep apnea   . TIA (transient ischemic attack)   . Tremors of nervous system    seeing PCP for this.  . Type 2 diabetes mellitus ([place])   . Wound infection       Review of Systems  Constitutional: Positive for malaise/fatigue. Negative for chills, fever and weight loss.  HENT: Negative for congestion, nosebleeds, sinus pain and sore throat.   Eyes: Negative for photophobia, pain and discharge.  Respiratory: Positive for cough, sputum production, shortness of breath and wheezing. Negative for hemoptysis.   Cardiovascular: Negative for chest pain, palpitations, orthopnea and leg swelling.  Gastrointestinal: Negative for abdominal pain, constipation, diarrhea, nausea and vomiting.  Genitourinary: Negative for dysuria, frequency, hematuria and urgency.  Musculoskeletal: Negative for back pain, joint pain, myalgias and neck pain.  Skin: Negative for itching and rash.  Neurological: Negative for tingling, tremors, sensory change, speech change, focal weakness, seizures, weakness and headaches.  Psychiatric/Behavioral: Negative for memory loss, substance abuse and suicidal  ideas. The patient is not nervous/anxious.       Objective:  Physical Exam   Vitals:   10/24/18 1053  BP: 126/74  Pulse: 79  SpO2: 95%  Weight: 253 lb (114.8 kg)  Height: 5\' 4"  (1.626 m)    RA  NLZ:JQBHAL but well appearing HENT: OP clear, TM's clear, neck supple PULM: wheezing bilaterally B, normal percussion CV: RRR, no mgr, trace edema GI: BS+, soft, nontender Derm: no cyanosis or rash Psyche: normal mood and affect    CBC      Component Value Date/Time   WBC 4.2 10/12/2018 1303   RBC 4.13 10/12/2018 1303   HGB 13.2 10/12/2018 1303   HGB 13.5 02/08/2018 0914   HCT 39.3 10/12/2018 1303   HCT 40.2 02/08/2018 0914   PLT 170 10/12/2018 1303   PLT 204 02/08/2018 0914   MCV 95.2 10/12/2018 1303   MCV 96 02/08/2018 0914   MCH 32.0 10/12/2018 1303   MCHC 33.6 10/12/2018 1303   RDW 12.6 10/12/2018 1303   RDW 15.0 02/08/2018 0914   LYMPHSABS 1.9 10/12/2018 1303   LYMPHSABS 1.6 02/08/2018 0914   MONOABS 0.4 10/12/2018 1303   EOSABS 0.1 10/12/2018 1303   EOSABS 0.0 02/08/2018 0914   BASOSABS 0.0 10/12/2018 1303   BASOSABS 0.0 02/08/2018 0914     Chest imaging: By report from her immunologist in [place]: CT chest in 2016 showed several tiny lung nodules as well as bronchiectasis.  PFT: 11/2016 [place]: Ratio 73, FEV1 1.52L (57% pred) April 2019 spirometry from asthma and allergy: Ratio 59%, FEV1 1.59 L 67% predicted, 15% change with bronchodilator  Labs:  Path:  Echo:  Heart Catheterization:  Sleep study: 2011 polysomnogram from [place] showed an AHI of 14.11 January 2018 appliance report showed greater than 80% compliance with her CPAP machine  Immunizations: Flu 07/2017 Pneumovax 05/2015    Assessment & Plan:   Somnolence, daytime - Plan: Split night study  OSA (obstructive sleep apnea) - Plan: Split night study  Wheezing - Plan: Nitric oxide, Spirometry with Graph  Bronchiectasis without complication ([place])  Discussion: She is not doing well on several fronts right now: She needs to have insurance approval for her CPAP.  I do worry that her weight gain and rhythm abnormalities recently could be related to her obstructive sleep apnea so I think performing a split night sleep study is actually a good idea for her right now.  Her asthma has been doing poorly recently.  I think this is related to her weight gain, though given the severity of her underlying disease it may be reasonable to  consider biologic therapy for her later in the year if weight loss does not help.  I do not feel that her bronchiectasis is any worse right now she is not producing purulent mucus.  Plan: Bronchiectasis: Keep using a flutter valve twice a day no matter how you feel  Severe persistent asthma: Poorly controlled recently, worse Use Dulera with a spacer Use Spiriva daily We will check spirometry testing today and an exhaled nitric oxide test today You need to lose weight If you do not see improvement with symptoms over the next several weeks after weight loss then we may need to consider biologic therapy, I will message Dr. Ernst Bowler about this  Common variable immunodeficiency: Continue IVIG infusions through Dr. Gillermina Hu office  Obstructive sleep apnea: We need to order a split-night study for you to make sure the pressure is appropriate and to get insurance approval for  your machine  We will see you back in 2 to 3 months or sooner if need    Current Outpatient Medications:  .  acetaminophen (TYLENOL) 325 MG tablet, Take 2 tablets (650 mg total) by mouth every 6 (six) hours as needed for mild pain, fever or headache., Disp: 30 tablet, Rfl: 1 .  Albuterol Sulfate (PROAIR RESPICLICK) 387 (90 Base) MCG/ACT AEPB, Inhale 1 puff into the lungs every 6 (six) hours as needed (for shortness of breath/wheezing). , Disp: , Rfl:  .  ALPRAZolam (XANAX) 0.5 MG tablet, Take 0.5 mg by mouth daily as needed for anxiety. , Disp: , Rfl: 0 .  apixaban (ELIQUIS) 5 MG TABS tablet, Take 5 mg by mouth 2 (two) times daily., Disp: , Rfl:  .  ARIPiprazole (ABILIFY) 5 MG tablet, Take 1 tablet (5 mg total) by mouth every evening., Disp: 90 tablet, Rfl: 0 .  atorvastatin (LIPITOR) 10 MG tablet, Take 10 mg by mouth every evening. , Disp: , Rfl:  .  diltiazem (CARDIZEM CD) 300 MG 24 hr capsule, Take 300 mg by mouth every morning. , Disp: , Rfl:  .  divalproex (DEPAKOTE ER) 500 MG 24 hr tablet, Take 1,500 mg by  mouth 2 (two) times daily., Disp: , Rfl:  .  Dulaglutide (TRULICITY) 1.5 FI/4.3PI SOPN, Inject 1.5 mg into the skin every Sunday. , Disp: , Rfl:  .  furosemide (LASIX) 20 MG tablet, Take 20-40 mg by mouth See admin instructions. Take 2 tablets (40mg  total) every morning and 1 tablet (20mg  total)  at noon., Disp: , Rfl:  .  gabapentin (NEURONTIN) 600 MG tablet, Take 600 mg by mouth 3 (three) times daily., Disp: , Rfl:  .  HUMALOG KWIKPEN 100 UNIT/ML KwikPen, , Disp: , Rfl:  .  hydrALAZINE (APRESOLINE) 25 MG tablet, Take 25 mg by mouth 3 (three) times daily., Disp: , Rfl:  .  hydrochlorothiazide (HYDRODIURIL) 25 MG tablet, Take 25 mg by mouth every morning. , Disp: , Rfl:  .  hydrOXYzine (ATARAX/VISTARIL) 25 MG tablet, Take 1 tablet (25 mg total) by mouth every 6 (six) hours as needed for anxiety or nausea., Disp: 30 tablet, Rfl: 0 .  Immune Globulin, Human, (CUVITRU) 4 GM/20ML SOLN, Inject 100 mLs into the skin every 14 (fourteen) days., Disp: , Rfl:  .  insulin glargine (LANTUS) 100 UNIT/ML injection, Inject 60 Units into the skin at bedtime., Disp: , Rfl:  .  lamoTRIgine (LAMICTAL) 100 MG tablet, Take 50 mg by mouth at bedtime. , Disp: , Rfl:  .  levothyroxine (SYNTHROID, LEVOTHROID) 112 MCG tablet, Take 112 mcg by mouth daily before breakfast., Disp: , Rfl:  .  lisinopril (PRINIVIL,ZESTRIL) 40 MG tablet, Take 40 mg by mouth every morning. , Disp: , Rfl:  .  Melatonin 3 MG TABS, Take 3 mg by mouth at bedtime., Disp: , Rfl:  .  metFORMIN (GLUCOPHAGE-XR) 500 MG 24 hr tablet, Take 1,000 mg by mouth 2 (two) times daily. , Disp: , Rfl: 0 .  mometasone-formoterol (DULERA) 200-5 MCG/ACT AERO, Inhale 2 puffs into the lungs 2 (two) times daily., Disp: 1 Inhaler, Rfl: 0 .  Spacer/Aero-Holding Chambers (AEROCHAMBER MV) inhaler, Use as instructed, Disp: 1 each, Rfl: 0 .  tiotropium (SPIRIVA) 18 MCG inhalation capsule, Place 18 mcg into inhaler and inhale daily as needed (for breathing). , Disp: , Rfl:  .   Valbenazine Tosylate (INGREZZA) 40 MG CAPS, Take 40 mg by mouth daily., Disp: 30 capsule, Rfl: 1 .  Spacer/Aero-Holding Josiah Lobo (  AEROCHAMBER MV) inhaler, Use as instructed, Disp: 1 each, Rfl: 0

## 2018-10-24 NOTE — Addendum Note (Signed)
Addended by: Len Blalock on: 10/24/2018 04:57 PM   Modules accepted: Orders

## 2018-10-24 NOTE — Patient Instructions (Signed)
Bronchiectasis: Keep using a flutter valve twice a day no matter how you feel  Severe persistent asthma: Poorly controlled recently, worse Use Dulera with a spacer Use Spiriva daily We will check spirometry testing today and an exhaled nitric oxide test today You need to lose weight If you do not see improvement with symptoms over the next several weeks after weight loss then we may need to consider biologic therapy, I will message Dr. Ernst Bowler about this  Common variable immunodeficiency: Continue IVIG infusions through Dr. Gillermina Hu office  Obstructive sleep apnea: We need to order a split-night study for you to make sure the pressure is appropriate and to get insurance approval for your machine  We will see you back in 2 to 3 months or sooner if need

## 2018-10-30 ENCOUNTER — Telehealth: Payer: Self-pay | Admitting: Cardiovascular Disease

## 2018-10-30 NOTE — Telephone Encounter (Signed)
Results of monitor / tg

## 2018-10-31 DIAGNOSIS — I4729 Other ventricular tachycardia: Secondary | ICD-10-CM

## 2018-10-31 HISTORY — DX: Other ventricular tachycardia: I47.29

## 2018-10-31 NOTE — Telephone Encounter (Signed)
Notified pt that monitor as been downloaded and will be resulted and called to pt .

## 2018-11-06 ENCOUNTER — Ambulatory Visit: Payer: Medicare Other | Attending: Pulmonary Disease | Admitting: Pulmonary Disease

## 2018-11-06 DIAGNOSIS — J45909 Unspecified asthma, uncomplicated: Secondary | ICD-10-CM | POA: Insufficient documentation

## 2018-11-06 DIAGNOSIS — I4891 Unspecified atrial fibrillation: Secondary | ICD-10-CM | POA: Diagnosis not present

## 2018-11-06 DIAGNOSIS — I509 Heart failure, unspecified: Secondary | ICD-10-CM | POA: Diagnosis not present

## 2018-11-06 DIAGNOSIS — D839 Common variable immunodeficiency, unspecified: Secondary | ICD-10-CM | POA: Diagnosis not present

## 2018-11-06 DIAGNOSIS — G4733 Obstructive sleep apnea (adult) (pediatric): Secondary | ICD-10-CM | POA: Insufficient documentation

## 2018-11-06 DIAGNOSIS — R400 Somnolence: Secondary | ICD-10-CM

## 2018-11-12 ENCOUNTER — Telehealth: Payer: Self-pay | Admitting: *Deleted

## 2018-11-12 DIAGNOSIS — Z79899 Other long term (current) drug therapy: Secondary | ICD-10-CM
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
## 2018-11-12 NOTE — Telephone Encounter (Signed)
-----   Message from Erma Heritage, [place] sent at 11/09/2018  9:56 AM EST ----- Please let the patient know that her cardiac event monitor showed normal sinus rhythm with no recurrence of atrial fibrillation or atrial flutter. Did have occasional PAC's and PVC's and recent echocardiogram showed a preserved EF with no regional WMA. Recommend limiting alcohol and caffeine intake. K+ normal by most recent check. Would recommend checking Magnesium level at the time of her next labs. Continue current medication regimen for now and keep scheduled follow-up. Please forward a copy of results to [name], MD

## 2018-11-13 NOTE — Progress Notes (Signed)
A, please let her know that this showed sleep apnea.  We should discuss with her in clinic soon.  Please arrange f/u in 2-3 weeks.

## 2018-11-13 NOTE — Procedures (Signed)
    Patient Name: Laura [name], Laura [name] Date: 11/06/2018   Gender: Female  D.O.B: 09-Feb-1961  Age (years): 58  Referring Provider: Simonne Maffucci  Height (inches): 37  Interpreting Physician: Chesley Mires MD, ABSM  Weight (lbs): 253  RPSGT: Rosebud Poles  BMI: 37  MRN: [id number]  Neck Size: 18.50    CLINICAL INFORMATION  Sleep Study Type: NPSG Indication for sleep study: 58 year old with history of asthma, CVID, atrial fibrillation and congestive heart failure presents for evaluation of obstructive sleep apnea. She had prior sleep study from 2011 that showed mild obstructive sleep apnea with an AHI of 14.4. Epworth Sleepiness Score: 10 SLEEP STUDY TECHNIQUE  As per the AASM Manual for the Scoring of Sleep and Associated Events v2.3 (April 2016) with a hypopnea requiring 4% desaturations. The channels recorded and monitored were frontal, central and occipital EEG, electrooculogram (EOG), submentalis EMG (chin), nasal and oral airflow, thoracic and abdominal wall motion, anterior tibialis EMG, snore microphone, electrocardiogram, and pulse oximetry. MEDICATIONS  Medications self-administered by patient taken the night of the study : N/A SLEEP ARCHITECTURE  The study was initiated at 10:12:03 PM and ended at 5:05:20 AM. Sleep onset time was 16.2 minutes and the sleep efficiency was 81.7%%. The total sleep time was 337.5 minutes. Stage REM latency was 289.5 minutes. The patient spent 11.1%% of the night in stage N1 sleep, 64.6%% in stage N2 sleep, 14.4%% in stage N3 and 9.9% in REM. Alpha intrusion was absent. Supine sleep was 41.67%. RESPIRATORY PARAMETERS  The overall apnea/hypopnea index (AHI) was 11.4 per hour. There were 2 total apneas, including 2 obstructive, 0 central and 0 mixed apneas. There were 62 hypopneas and 2 RERAs. The AHI during Stage REM sleep was 39.4 per hour. AHI while supine was 16.2 per hour. The mean oxygen saturation was 93.7%. The minimum SpO2 during  sleep was 78.0%. moderate snoring was noted during this study. CARDIAC DATA  The 2 lead EKG demonstrated sinus rhythm. The mean heart rate was 67.6 beats per minute. Other EKG findings include: PVCs.  LEG MOVEMENT DATA  The total PLMS were 0 with a resulting PLMS index of 0.0. Associated arousal with leg movement index was 0.0 . IMPRESSIONS  - Mild obstructive sleep apnea with an AHI of 11.4 and SpO2 low of 78%. She did have a significant REM effect with REM AHI of 39.4. - She did not require supplemental oxygen during this study. DIAGNOSIS  - Obstructive Sleep Apnea (327.23 [G47.33 ICD-10]) RECOMMENDATIONS  - Additional therapies include weight loss, CPAP, oral appliance, or surgical assessment. [Electronically signed] 11/13/2018 07:57 AM Chesley Mires MD, ABSM  Diplomate, American Board of Sleep Medicine  NPI: [id number]

## 2018-11-14 ENCOUNTER — Ambulatory Visit (INDEPENDENT_AMBULATORY_CARE_PROVIDER_SITE_OTHER): Payer: Medicare Other | Admitting: Psychiatry

## 2018-11-14 ENCOUNTER — Encounter (HOSPITAL_COMMUNITY): Payer: Self-pay | Admitting: Psychiatry

## 2018-11-14 VITALS — BP 133/82 | HR 72 | Ht 64.0 in | Wt 251.0 lb

## 2018-11-14 DIAGNOSIS — F4310 Post-traumatic stress disorder, unspecified: Secondary | ICD-10-CM

## 2018-11-14 DIAGNOSIS — F0630 Mood disorder due to known physiological condition, unspecified: Secondary | ICD-10-CM | POA: Diagnosis not present

## 2018-11-14 NOTE — Patient Instructions (Signed)
1. Discontinue Abilify 2. Continue depakote 3000 mg at night 3. Continue lamotrigine 50 mg daily  4. Discontinue valbenazine 5. Referral to therapy  6. Return to clinic in two months for 30 mins

## 2018-11-19 ENCOUNTER — Telehealth: Payer: Self-pay | Admitting: Pulmonary Disease

## 2018-11-19 DIAGNOSIS — G4733 Obstructive sleep apnea (adult) (pediatric): Secondary | ICD-10-CM

## 2018-11-19 NOTE — Telephone Encounter (Signed)
She did not meet split night protocol, increasing event in later REM stage.  AHI 11.2, showed mild sleep apnea. Follow up with [name].

## 2018-11-19 NOTE — Telephone Encounter (Signed)
Spoke with pt. She is aware of her sleep study results. Order has been placed for CPAP supplies per [facility]. Nothing further was needed.

## 2018-11-19 NOTE — Telephone Encounter (Signed)
Spoke with pt. She is requesting her sleep study results. Pt is waiting for these results so that she can get CPAP supplies from her DME.  Beth - please advise as BQ is on vacation. Thank you.

## 2018-11-19 NOTE — Telephone Encounter (Signed)
I'm not familiar with patient but we can try and order CPAP supplies through her DME company based on sleep test results showing mild OSA. Otherwise follow up with [name] or NP sooner for OV.

## 2018-11-19 NOTE — Telephone Encounter (Signed)
Called and spoke with Patient.  Geraldo Pitter, NP recommendations given. Patient stated that she has not been able to get new CPAP supplies for over 6 months due to insurance.  She stated that during her sleep study, she had problems falling asleep, so she was told they did not get to do the complete test, because she went to sleep so late. Patient is concerned that she needs new supplies.  Patient is scheduled with Dr Lake Bells 01/23/19, next available OV with BQ is 12/17/18. Patient wanted to know if there is anything else that can be done.   Message routed to Geraldo Pitter, NP to advise

## 2018-11-27 ENCOUNTER — Telehealth: Payer: Self-pay

## 2018-11-27 NOTE — Telephone Encounter (Signed)
-----   Message from Valentina Shaggy, MD sent at 11/26/2018  1:53 PM EST ----- Laura [name] -   Can you call this patient to see if she can come in sometime this week or next to discuss a biologic for her asthma?   Thanks!   Salvatore Marvel, MD [facility] ----- Message ----- From: Juanito Doom, MD Sent: 10/24/2018  11:20 AM EST To: Valentina Shaggy, MD  [name],  I wonder if biologic therapy is in the cards for her this year.  Hoping weight loss will help first...  [name]

## 2018-11-27 NOTE — Telephone Encounter (Signed)
Left a voicemail for her to call back and schedule.

## 2018-11-28 NOTE — Telephone Encounter (Signed)
Noted.  Thank so much Laura [name].  Salvatore Marvel, MD [facility] [place] [place]

## 2018-11-29 ENCOUNTER — Telehealth: Payer: Self-pay | Admitting: Pulmonary Disease

## 2018-11-29 NOTE — Telephone Encounter (Signed)
Called and spoke with [place] at [facility].  She stated that Cpap supplies order was over looked and she was putting a rush on it. Called and notified Patient that Huey Romans should be contacting her. Understanding stated.  Nothing further at this time.

## 2018-11-30 ENCOUNTER — Ambulatory Visit (INDEPENDENT_AMBULATORY_CARE_PROVIDER_SITE_OTHER): Payer: Medicare Other | Admitting: Allergy & Immunology

## 2018-11-30 ENCOUNTER — Encounter: Payer: Self-pay | Admitting: Allergy & Immunology

## 2018-11-30 VITALS — BP 138/62 | HR 76 | Resp 20 | Ht 64.0 in | Wt 251.8 lb

## 2018-11-30 DIAGNOSIS — R0602 Shortness of breath: Secondary | ICD-10-CM

## 2018-11-30 DIAGNOSIS — J4550 Severe persistent asthma, uncomplicated: Secondary | ICD-10-CM | POA: Diagnosis not present

## 2018-11-30 DIAGNOSIS — D839 Common variable immunodeficiency, unspecified: Secondary | ICD-10-CM

## 2018-11-30 DIAGNOSIS — J310 Chronic rhinitis: Secondary | ICD-10-CM

## 2018-11-30 NOTE — Patient Instructions (Addendum)
1. Hypogammaglobulinemia - Continue with Cuvitru as you are doing.  - Last IgG level was good and you are not experiencing breakthrough infections, so we are going to continue with the current dose.  2. Chronic rhinitis   - Continue with nasal saline rinses.  - We can hold off on the nasal saline rinses.  3. Severe persistent asthma, uncomplicated - Lung function looked slightly worse today. - We will consider the addition of biologic medications.  - We will work on getting approval for these injections.  - Daily controller medication(s): Dulera 200/78mcg two puffs twice daily with spacer and Spiriva 1.59mcg two puffs once daily - Prior to physical activity: ProAir 2 puffs 10-15 minutes before physical activity. - Rescue medications: ProAir 4 puffs every 4-6 hours as needed, albuterol nebulizer one vial every 4-6 hours as needed or DuoNeb nebulizer one vial every 4-6 hours as needed - Asthma control goals:  * Full participation in all desired activities (may need albuterol before activity) * Albuterol use two time or less a week on average (not counting use with activity) * Cough interfering with sleep two time or less a month * Oral steroids no more than once a year * No hospitalizations.  4. Return in about 3 months (around 02/28/2019).   Please inform us of any Emergency Department visits, hospitalizations, or changes in symptoms. Call us before going to the ED for breathing or allergy symptoms since we might be able to fit you in for a sick visit. Feel free to contact us anytime with any questions, problems, or concerns.  It was a pleasure to see you again today!  Websites that have reliable patient information: 1. American Academy of Asthma, Allergy, and Immunology: www.aaaai.org 2. Food Allergy Research and Education (FARE): foodallergy.org 3. Mothers of Asthmatics: http://www.asthmacommunitynetwork.org 4. American College of Allergy, Asthma, and Immunology:  MonthlyElectricBill.co.uk   Make sure you are registered to vote! If you have moved or changed any of your contact information, you will need to get this updated before voting!    Voter ID laws are NOT going into effect for the General Election in November 2020! DO NOT let this stop you from exercising your right to vote!

## 2018-11-30 NOTE — Progress Notes (Signed)
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 [name] presents for a follow-up visit to discuss the addition of a biologic for her asthma regimen.  While she has only required prednisone in the last 12 months, she is requiring more frequent use of her albuterol inhaler.  This increase in symptoms has affected her lifestyle and she has been unable to exercise as much as she used to.  This in turn is led to increased weight gain.  She is trying to reverse this, but after much discussion we did decide to go ahead and try to add a biologic.  We will likely go with mepolizumab given the larger amount of safety data.  However, if our Biologics Coordinator feels that benralizumab would be more affordable for her, we can certainly go with that as well.  We had a long discussion about the risk and benefits of the biologics, including the risk of anaphylaxis.  I did reassure her that we see very few cases if any and we keep patients in the clinic after the injections.  With regards to her immunoglobulin replacement, we are going to keep this stable since she has been infection free since starting it.  Her last IgG level was perfect.  Plan/Recommendations:   1. Hypogammaglobulinemia - Continue with Cuvitru as you are  doing.  - Last IgG level was good and you are not experiencing breakthrough infections, so we are going to continue with the current dose.  2. Chronic rhinitis   - Continue with nasal saline rinses.  - We can hold off on the nasal saline rinses.  3. Severe persistent asthma, uncomplicated - Lung function looked slightly worse today. - We will consider the addition of biologic medications.  - We will work on getting approval for these injections.  - Daily controller medication(s): Dulera 200/9mcg two puffs twice daily with spacer and Spiriva 1.91mcg two puffs once daily - Prior to physical activity: ProAir 2 puffs 10-15 minutes before physical activity. - Rescue medications: ProAir 4 puffs every 4-6 hours as needed, albuterol nebulizer one vial every 4-6 hours as needed or DuoNeb nebulizer one vial every 4-6 hours as needed - Asthma control goals:  * Full participation in all desired activities (may need albuterol before activity) * Albuterol use two time or less a week on average (not counting use with activity) * Cough interfering with sleep two time or less a month * Oral steroids no more than once a year * No hospitalizations.  4. Return in about 3 months (around 02/28/2019).   Subjective:   Laura [name] is a 58 y.o. female presenting today for follow up of  Chief Complaint  Patient presents with  . Asthma    Laura [name] has a history of the following: Patient Active Problem List   Diagnosis Date Noted  . Atypical chest pain 08/10/2018  . Bipolar disorder ([place]) 08/10/2018  . Headache 08/10/2018  . H/O  atrial flutter 08/10/2018  . OSA on CPAP 08/10/2018  . Anxiety 08/10/2018  . Uncontrolled type 2 diabetes mellitus with hyperglycemia ([place]) 03/13/2018  . Hypothyroidism 03/13/2018  . Essential hypertension, benign 03/13/2018  . Mixed hyperlipidemia 03/13/2018  . Hypogammaglobulinemia ([place]) 02/06/2018  . Non-allergic rhinitis 02/06/2018  . Severe persistent asthma,  uncomplicated 82/42/3536  . Pulmonary nodules 02/06/2018  . Bronchiectasis without complication ([place]) 14/43/1540  . DM type 2 causing vascular disease ([place]) 02/06/2018    History obtained from: chart review and patient.  PCP: Dr. Meade Maw Psychiatrist: Dr. [name] Cardiologist: Dr. Kate Sable Endocrinologist: Dr. Loni Beckwith Pulmonologist: Dr. Simonne Maffucci Orthopedic Surgeon:Dr. Almedia Balls   Previous history: She was first seen in April 2019. 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 Her labs firm common variable immune deficiency with a low IgG and a borderline low IgA.Her response to vaccines is certainly insufficient, warranting her placement on placement immunoglobulin in 2017. We were able to get her on Cuvitru for herCVID.She has not been placed on a biologic for her asthma (IgE <2 and there is no evidence of eosinophilia).   Laura [name] is a 58 y.o. female presenting for a follow up visit.  She was last seen in December 2019.  At that time, she was doing well from an infection standpoint.  We continued her immunoglobulin replacement since she was stable.  Her rhinitis was controlled with nasal saline rinses.  She has had negative testing in the past via blood, but we had considered doing skin testing at one point.  Her spirometry looks stable, but she was endorsing shortness of breath which I felt was secondary to her atrial fibrillation.  We continued her on Dulera 200/5 mcg 2 puffs twice daily combined with Spiriva 1.25 mcg 2 puffs once daily.   Since the last visit, she has done fairly well. She has been more socially engaged and in fact her sister was recently married. She was introduced to racing last week with a party centered on the [place].    Asthma/Respiratory Symptom History: She remains on the [facility] two puffs BID. She is using the Spiriva. She is using her rescue inhaler 2-3 times per day,  sometimes more. She feels that her asthma has become more and more of an issue over the last 3-4 months; she has not used prednisone since her adverse reaction in August 2018 that landed her in the hospital secondary to prednisone. She now working on getting her sleep apnea addressed. She recently got sleep study done to update that and is being fitted for a CPAP machine. She has been fighting with this for one year or so. Her last prednisone use was August 2019. She sleeps fairly well at night. ACT is 16 indicating subpar asthma control. [facility]. She is doing the [employer].   Allergic Rhinitis Symptom History: She remains on a nasal spray nearly daily. She does not use an antihistamine daily at all. She denies sinus pain and pressure.   Infection History: Her last IgG level was 963 in January 2020. She had an infusion a few days ago. It went well. Her last illness was when she first started seeing me before her infusions were transferred over to our care.   She did have to wear heart monitor, but her heart is now in normal rhythm.  Her tremors have remained stable with the addition  of the new medication.    Otherwise, there have been no changes to her past medical history, surgical history, family history, or social history.    Review of Systems  Constitutional: Negative.  Negative for fever, malaise/fatigue and weight loss.  HENT: Negative.  Negative for congestion, ear discharge, ear pain, nosebleeds and sinus pain.   Eyes: Negative for pain, discharge and redness.  Respiratory: Positive for cough and shortness of breath. Negative for sputum production and wheezing.   Cardiovascular: Negative.  Negative for chest pain and palpitations.  Gastrointestinal: Negative for abdominal pain and heartburn.  Skin: Negative.  Negative for itching and rash.  Neurological: Negative for dizziness and headaches.  Endo/Heme/Allergies:  Negative for environmental allergies. Does not bruise/bleed easily.  Psychiatric/Behavioral: Positive for depression. Negative for substance abuse and suicidal ideas.       Objective:   Blood pressure 138/62, pulse 76, resp. rate 20, height 5\' 4"  (1.626 m), weight 251 lb 12.8 oz (114.2 kg), SpO2 96 %. Body mass index is 43.22 kg/m.   Physical Exam:  Physical Exam  Constitutional: She appears well-developed and well-nourished.  Seems more interactive today.  HENT:  Head: Normocephalic and atraumatic.  Right Ear: Tympanic membrane, external ear and ear canal normal.  Left Ear: Tympanic membrane and ear canal normal.  Nose: No mucosal edema, rhinorrhea, nasal deformity or septal deviation. No epistaxis. Right sinus exhibits no maxillary sinus tenderness and no frontal sinus tenderness. Left sinus exhibits no maxillary sinus tenderness and no frontal sinus tenderness.  Mouth/Throat: Uvula is midline and oropharynx is clear and moist. Mucous membranes are not pale and not dry.  Eyes: Pupils are equal, round, and reactive to light. Conjunctivae and EOM are normal. Right eye exhibits no chemosis and no discharge. Left eye exhibits no chemosis and no discharge. Right conjunctiva is not injected. Left conjunctiva is not injected.  Cardiovascular: Normal rate, regular rhythm and normal heart sounds.  Respiratory: Effort normal. No accessory muscle usage. No tachypnea. No respiratory distress. She has decreased breath sounds in the right lower field and the left lower field. She has no wheezes. She has rhonchi. She has no rales. She exhibits no tenderness.  Lymphadenopathy:    She has no cervical adenopathy.  Neurological: She is alert.  Skin: No abrasion, no petechiae and no rash noted. Rash is not papular, not vesicular and not urticarial. No erythema. No pallor.  Psychiatric: She has a normal mood and affect.     Diagnostic studies:    Spirometry: results abnormal (FEV1: 1.58/66%, FVC:  2.04/64%, FEV1/FVC: 77%).    Spirometry consistent with possible restrictive disease.   Allergy Studies: none         Salvatore Marvel, MD  [facility]

## 2018-12-04 ENCOUNTER — Telehealth: Payer: Self-pay | Admitting: *Deleted

## 2018-12-04 NOTE — Telephone Encounter (Signed)
T/C to patient to discuss starting biologic for asthma.  I had to get her info for her optum rx part d plan.  I explained process and submission to specialty pharmacy.  I did also advise patient that Dr Ernst Bowler wants to followup on her chest Ct from 2016 and someone from [facility] will be in touch to schedule her appt for same

## 2018-12-04 NOTE — Telephone Encounter (Signed)
Thanks, Tam Tam!   Aaronjames Kelsay, MD [facility]  

## 2018-12-04 NOTE — Addendum Note (Signed)
Addended by: Valentina Shaggy on: 12/04/2018 12:15 PM   Modules accepted: Orders

## 2018-12-04 NOTE — Progress Notes (Signed)
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   Salvatore Marvel, MD [facility]

## 2018-12-04 NOTE — Progress Notes (Signed)
Order for high resolution ct scan placed. I will call to follow up on scheduling tomorrow.

## 2018-12-06 ENCOUNTER — Telehealth: Payer: Self-pay

## 2018-12-06 DIAGNOSIS — R0602 Shortness of breath: Secondary | ICD-10-CM

## 2018-12-06 NOTE — Telephone Encounter (Signed)
Patient is scheduled for the high res ct scan at [facility] on 12-21-2018 with arrival time of 145 pm. Patient is aware of appointment.

## 2018-12-07 ENCOUNTER — Ambulatory Visit (INDEPENDENT_AMBULATORY_CARE_PROVIDER_SITE_OTHER): Payer: Medicare Other | Admitting: Licensed Clinical Social Worker

## 2018-12-07 ENCOUNTER — Encounter (HOSPITAL_COMMUNITY): Payer: Self-pay | Admitting: Licensed Clinical Social Worker

## 2018-12-07 DIAGNOSIS — F4310 Post-traumatic stress disorder, unspecified: Secondary | ICD-10-CM | POA: Diagnosis not present

## 2018-12-07 DIAGNOSIS — F0630 Mood disorder due to known physiological condition, unspecified: Secondary | ICD-10-CM

## 2018-12-07 NOTE — Progress Notes (Signed)
Patient has been scheduled for scan and she is aware of appointment details.
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
## 2018-12-11 ENCOUNTER — Telehealth: Payer: Self-pay | Admitting: Pulmonary Disease

## 2018-12-11 NOTE — Telephone Encounter (Signed)
Spoke with pt, she states she has not heard from [facility] about her CPAP supplies. I called [facility] and they stated they tried to call her on 11/29/2018 but there was no answer. They have the order but needed to know what type of supplies she needed. I updated her phone number because they had a different number on her account. I called pt to let her know to call them at the number listed below. Pt understood and nothing further is needed.   [phone]

## 2018-12-12 DIAGNOSIS — E039 Hypothyroidism, unspecified: Secondary | ICD-10-CM | POA: Diagnosis not present

## 2018-12-12 DIAGNOSIS — E782 Mixed hyperlipidemia: Secondary | ICD-10-CM | POA: Diagnosis not present

## 2018-12-12 DIAGNOSIS — I10 Essential (primary) hypertension: Secondary | ICD-10-CM | POA: Diagnosis not present

## 2018-12-12 DIAGNOSIS — E1140 Type 2 diabetes mellitus with diabetic neuropathy, unspecified: Secondary | ICD-10-CM | POA: Diagnosis not present

## 2018-12-14 DIAGNOSIS — F4310 Post-traumatic stress disorder, unspecified: Secondary | ICD-10-CM | POA: Diagnosis not present

## 2018-12-14 DIAGNOSIS — G629 Polyneuropathy, unspecified: Secondary | ICD-10-CM | POA: Diagnosis not present

## 2018-12-14 DIAGNOSIS — J4550 Severe persistent asthma, uncomplicated: Secondary | ICD-10-CM | POA: Diagnosis not present

## 2018-12-14 DIAGNOSIS — E039 Hypothyroidism, unspecified: Secondary | ICD-10-CM | POA: Diagnosis not present

## 2018-12-14 DIAGNOSIS — F0630 Mood disorder due to known physiological condition, unspecified: Secondary | ICD-10-CM | POA: Diagnosis not present

## 2018-12-14 DIAGNOSIS — E1142 Type 2 diabetes mellitus with diabetic polyneuropathy: Secondary | ICD-10-CM | POA: Diagnosis not present

## 2018-12-14 DIAGNOSIS — I5032 Chronic diastolic (congestive) heart failure: Secondary | ICD-10-CM | POA: Diagnosis not present

## 2018-12-14 DIAGNOSIS — D801 Nonfamilial hypogammaglobulinemia: Secondary | ICD-10-CM | POA: Diagnosis not present

## 2018-12-14 DIAGNOSIS — I482 Chronic atrial fibrillation, unspecified: Secondary | ICD-10-CM | POA: Diagnosis not present

## 2018-12-14 DIAGNOSIS — G250 Essential tremor: Secondary | ICD-10-CM | POA: Diagnosis not present

## 2018-12-14 DIAGNOSIS — I10 Essential (primary) hypertension: Secondary | ICD-10-CM | POA: Diagnosis not present

## 2018-12-14 DIAGNOSIS — E782 Mixed hyperlipidemia: Secondary | ICD-10-CM | POA: Diagnosis not present

## 2018-12-20 NOTE — Addendum Note (Signed)
Addended by: Valentina Shaggy on: 12/20/2018 04:37 PM   Modules accepted: Orders

## 2018-12-21 ENCOUNTER — Ambulatory Visit (HOSPITAL_COMMUNITY)
Admission: RE | Admit: 2018-12-21 | Discharge: 2018-12-21 | Disposition: A | Discharge status: Home / Self Care | Payer: Medicare Other | Source: Ambulatory Visit | Attending: Allergy & Immunology | Admitting: Allergy & Immunology

## 2018-12-21 ENCOUNTER — Encounter: Payer: Self-pay | Admitting: Allergy & Immunology

## 2018-12-21 ENCOUNTER — Other Ambulatory Visit: Payer: Self-pay

## 2018-12-21 DIAGNOSIS — R0602 Shortness of breath: Secondary | ICD-10-CM | POA: Diagnosis not present

## 2018-12-21 DIAGNOSIS — R0600 Dyspnea, unspecified: Secondary | ICD-10-CM | POA: Diagnosis not present

## 2018-12-21 IMAGING — CT CT CHEST HIGH RESOLUTION WITHOUT CONTRAST
2 of 5 series · 15 of 36 positions shown, 18 images · non-contrast
Comparison: [DATE] chest radiograph.

CLINICAL DATA: Dyspnea. Remote smoking history. Reported history of
pulmonary nodules.

EXAM:
CT CHEST WITHOUT CONTRAST
TECHNIQUE: Multidetector CT imaging of the chest was performed following the
standard protocol without intravenous contrast. High resolution
imaging of the lungs, as well as inspiratory and expiratory imaging,
was performed.

[Series 3: thorax · axial · 0.74mm/px · z∈[+1192,+1468]mm · 12 of 152 slices shown, 15 images]
[im 7/152  mediastinal]
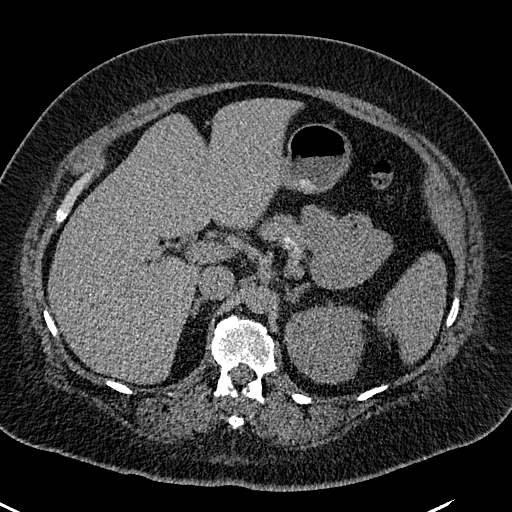
[im 7/152  lung]
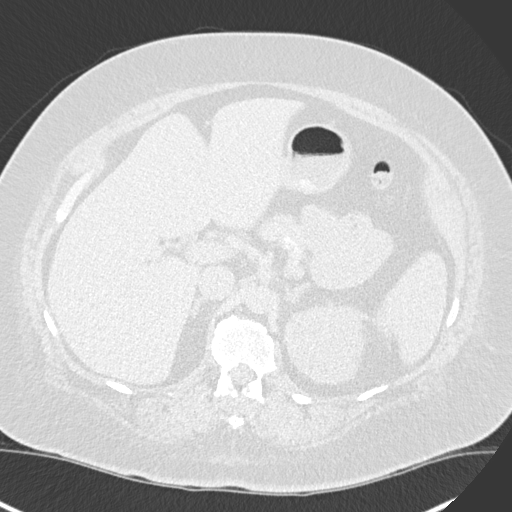
[im 20/152  lung]
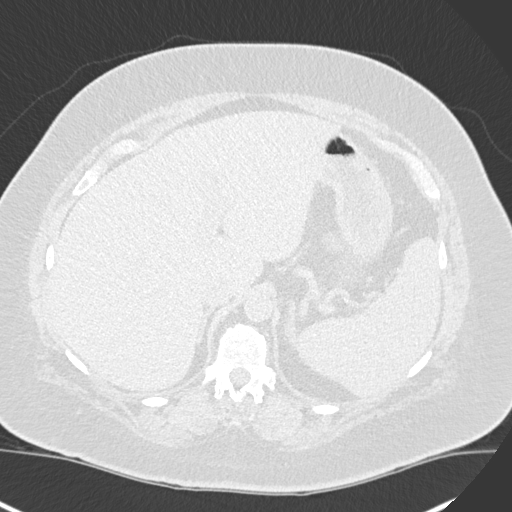
[im 33/152  lung]
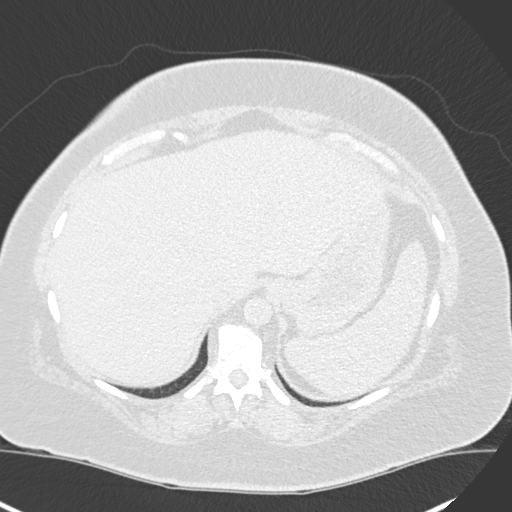
[im 46/152  lung]
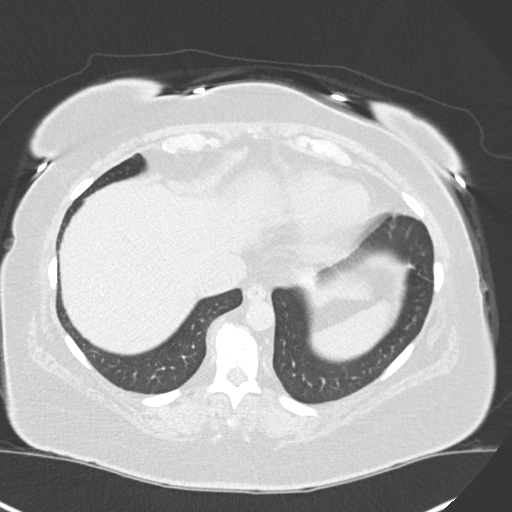
[im 60/152  mediastinal]
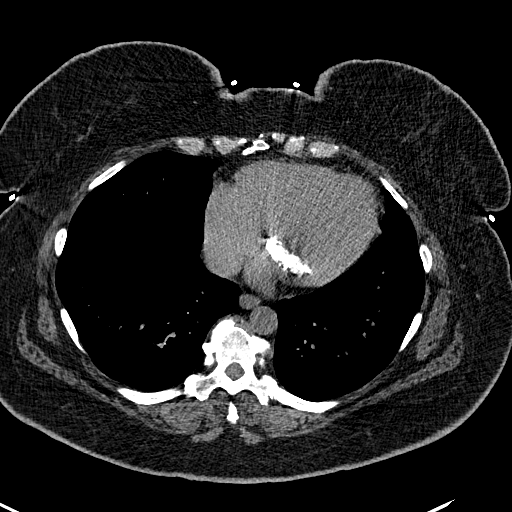
[im 60/152  lung]
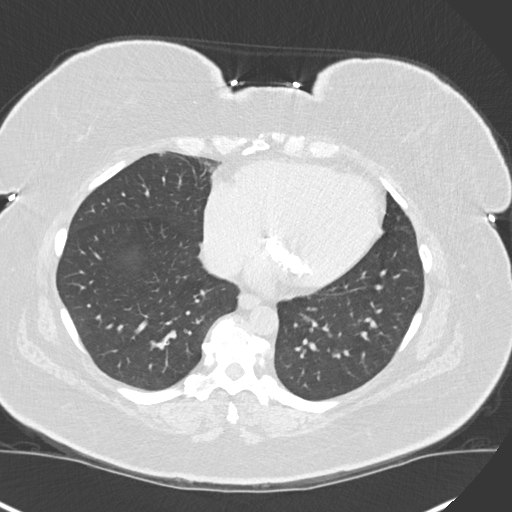
[im 73/152  lung]
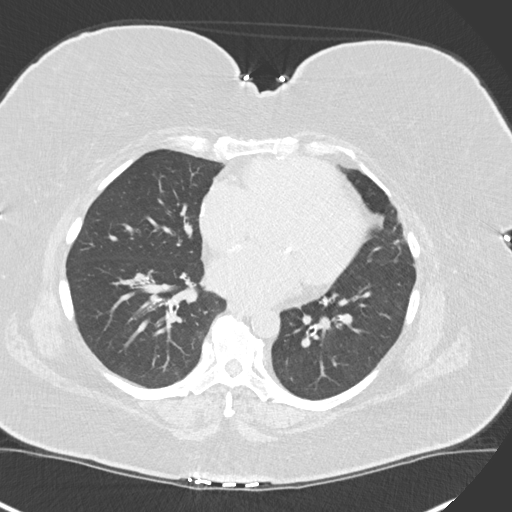
[im 79/152  lung]
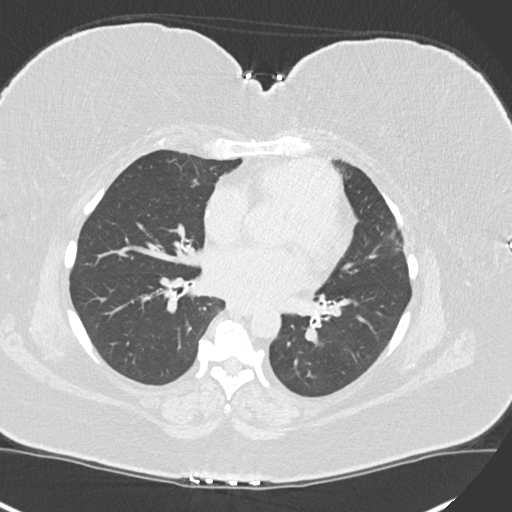
[im 92/152  lung]
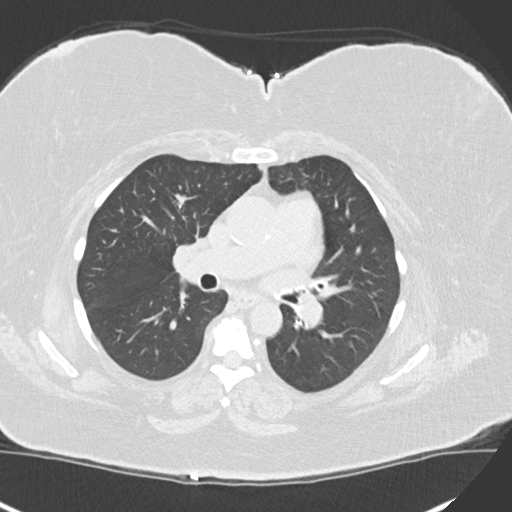
[im 106/152  mediastinal]
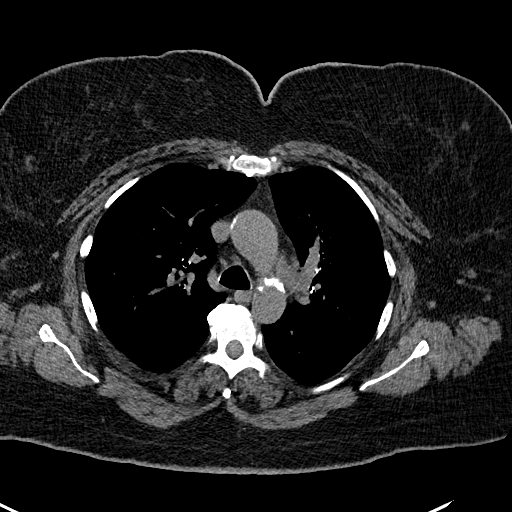
[im 106/152  lung]
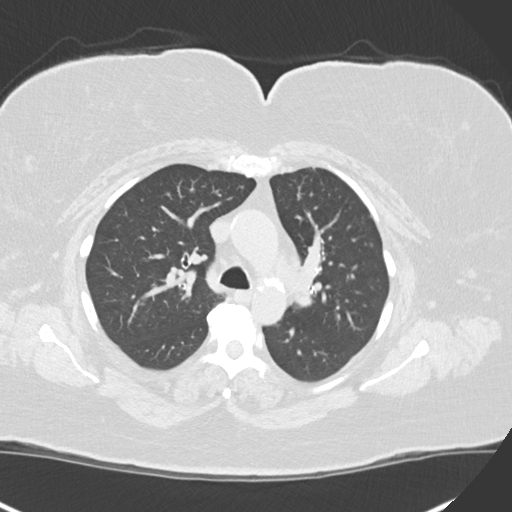
[im 119/152  lung]
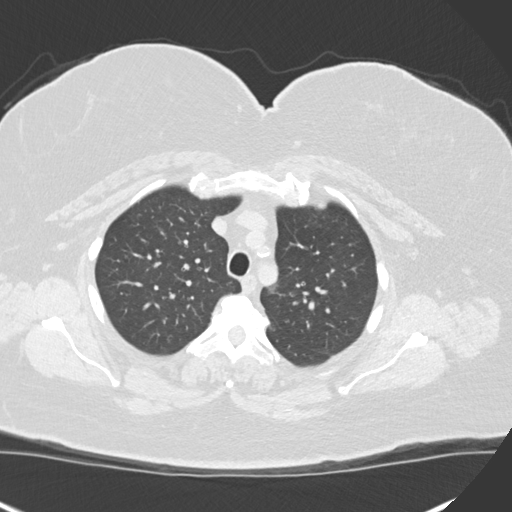
[im 132/152  lung]
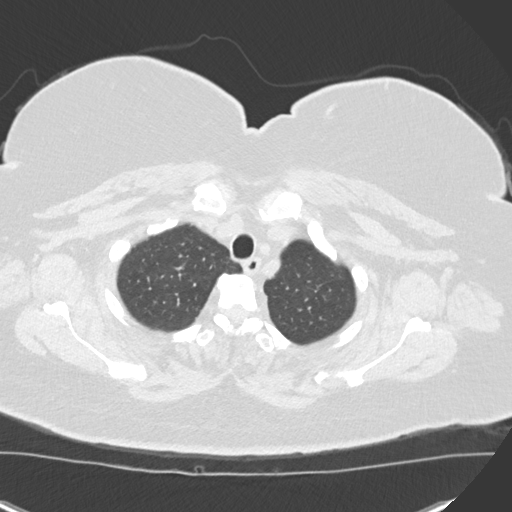
[im 145/152  lung]
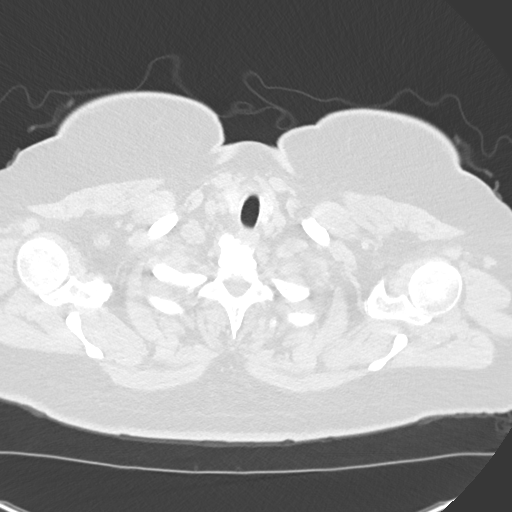

[Series 7: coronal · coronal · 0.62mm/px · 3 of 139 slices shown]
[im 28/139  lung]
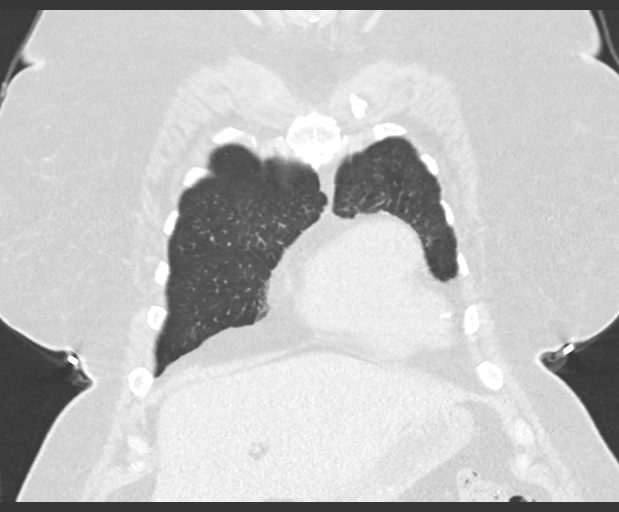
[im 56/139  lung]
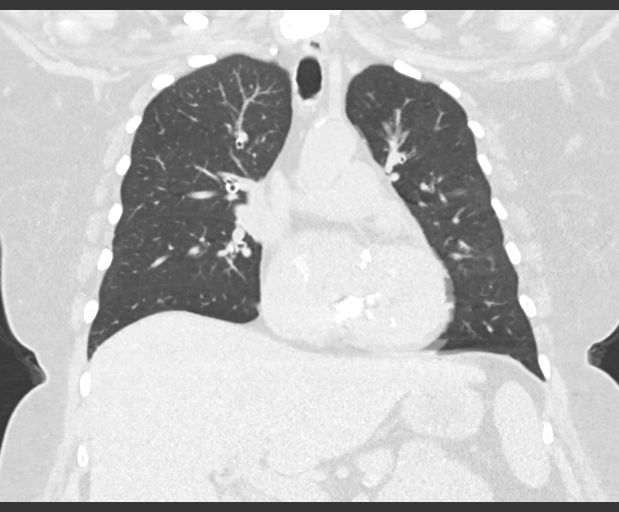
[im 83/139  lung]
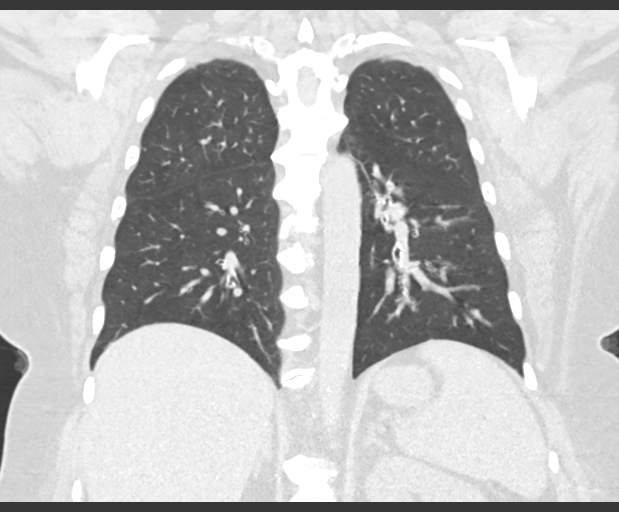

[15 of 36 positions shown; findings below may reference images not displayed]

FINDINGS: Cardiovascular: Normal heart size. No significant pericardial
effusion/thickening. Atherosclerotic nonaneurysmal thoracic aorta.
Normal caliber pulmonary arteries.

Mediastinum/Nodes: No discrete thyroid nodules. Unremarkable
esophagus. No pathologically enlarged axillary, mediastinal or hilar
lymph nodes, noting limited sensitivity for the detection of hilar
adenopathy on this noncontrast study.

Lungs/Pleura: No pneumothorax. No pleural effusion. Diffuse
bronchial wall thickening. No acute consolidative airspace disease,
lung masses or significant pulmonary nodules. No significant regions
of subpleural reticulation, ground-glass attenuation, traction
bronchiectasis, architectural distortion or frank honeycombing.
Small parenchymal bands in the mid lungs compatible with minimal
nonspecific postinfectious/postinflammatory scarring. No significant
air trapping on the expiration sequence.

Upper abdomen: No acute abnormality.

Musculoskeletal: No aggressive appearing focal osseous lesions.
Moderate thoracic spondylosis.
IMPRESSION: 1. No evidence of interstitial lung disease.
2. Nonspecific diffuse bronchial wall thickening, as can be seen
with chronic bronchitis and/or reactive airways disease.

Aortic Atherosclerosis ([EI]-[EI]).

## 2018-12-26 ENCOUNTER — Other Ambulatory Visit: Payer: Self-pay

## 2018-12-26 ENCOUNTER — Ambulatory Visit (INDEPENDENT_AMBULATORY_CARE_PROVIDER_SITE_OTHER): Payer: Medicare Other | Admitting: Cardiovascular Disease

## 2018-12-26 ENCOUNTER — Encounter: Payer: Self-pay | Admitting: Cardiovascular Disease

## 2018-12-26 VITALS — BP 122/72 | HR 69 | Ht 64.0 in | Wt 252.0 lb

## 2018-12-26 DIAGNOSIS — Z7901 Long term (current) use of anticoagulants: Secondary | ICD-10-CM | POA: Diagnosis not present

## 2018-12-26 DIAGNOSIS — I4892 Unspecified atrial flutter: Secondary | ICD-10-CM | POA: Diagnosis not present

## 2018-12-26 DIAGNOSIS — G4733 Obstructive sleep apnea (adult) (pediatric): Secondary | ICD-10-CM

## 2018-12-26 DIAGNOSIS — D839 Common variable immunodeficiency, unspecified: Secondary | ICD-10-CM

## 2018-12-26 DIAGNOSIS — I480 Paroxysmal atrial fibrillation: Secondary | ICD-10-CM | POA: Diagnosis not present

## 2018-12-26 DIAGNOSIS — E119 Type 2 diabetes mellitus without complications: Secondary | ICD-10-CM

## 2018-12-26 DIAGNOSIS — I10 Essential (primary) hypertension: Secondary | ICD-10-CM | POA: Diagnosis not present

## 2018-12-26 DIAGNOSIS — Z794 Long term (current) use of insulin: Secondary | ICD-10-CM

## 2018-12-26 DIAGNOSIS — Z8774 Personal history of (corrected) congenital malformations of heart and circulatory system: Secondary | ICD-10-CM

## 2018-12-26 DIAGNOSIS — IMO0001 Reserved for inherently not codable concepts without codable children: Secondary | ICD-10-CM

## 2018-12-26 NOTE — Progress Notes (Signed)
SUBJECTIVE: The patient presents for routine follow-up.  In summary, past medical history is complex and includes common variable immune deficiency, insulin-dependent diabetes mellitus, bipolar type I disorder, TIA, obstructive sleep apnea, and severe persistent asthma and bronchiectasis.  She had ASD repair at age 58 in [place] at [place].  She was evaluated for chest pain at [facility] in early November 2019 which was felt to be atypical for a cardiac etiology.  Echocardiogram demonstrated vigorous left ventricular systolic function, LVEF 65 to 70%, and normal regional wall motion.  She had severe LVH and mild left atrial dilatation.  She underwent elective direct-current cardioversion on 08/29/2018 for atrial flutter.  She underwent a high-resolution chest CT on 12/21/2018 which I reviewed and demonstrated nonspecific diffuse bronchial wall thickening and aortic atherosclerosis.  She denies chest pain and palpitations.  Her temperatures have been running in the 99 range.  She is worried about all that is happening with COVID19.  Social history: She moved to [place] in 2019 from [place], [place], where she had lived with her daughter for 3 years.  She is originally from [facility]  She has a daughter who lives in [place], a daughter who lives in [place], and a daughter who lives in [place].  She is currently living with her sister.  She is widowed.  She was married for 30 years.  Her husband died of bilateral pulmonary embolism, he also had lung cancer and a brain tumor and a history of non-Hodgkin's lymphoma.  Review of Systems: As per "subjective", otherwise negative.  Allergies  Allergen Reactions  . Ativan [Lorazepam] Hives  . Phenergan [Promethazine Hcl] Hives    Current Outpatient Medications  Medication Sig Dispense Refill  . acetaminophen (TYLENOL) 325 MG tablet Take 2 tablets (650 mg total) by mouth every 6 (six) hours as needed for mild  pain, fever or headache. 30 tablet 1  . Albuterol Sulfate (PROAIR RESPICLICK) 856 (90 Base) MCG/ACT AEPB Inhale 1 puff into the lungs every 6 (six) hours as needed (for shortness of breath/wheezing).     . ALPRAZolam (XANAX) 0.5 MG tablet Take 0.5 mg by mouth daily as needed for anxiety.   0  . apixaban (ELIQUIS) 5 MG TABS tablet Take 5 mg by mouth 2 (two) times daily.    Marland Kitchen atorvastatin (LIPITOR) 10 MG tablet Take 10 mg by mouth every evening.     . diltiazem (CARDIZEM CD) 300 MG 24 hr capsule Take 300 mg by mouth every morning.     . divalproex (DEPAKOTE ER) 500 MG 24 hr tablet Take 1,500 mg by mouth 2 (two) times daily.    . Dulaglutide (TRULICITY) 1.5 DJ/4.9FW SOPN Inject 1.5 mg into the skin every Sunday.     . furosemide (LASIX) 20 MG tablet Take 20-40 mg by mouth See admin instructions. Take 2 tablets (40mg  total) every morning and 1 tablet (20mg  total)  at noon.    . gabapentin (NEURONTIN) 600 MG tablet Take 600 mg by mouth 3 (three) times daily.    Marland Kitchen HUMALOG KWIKPEN 100 UNIT/ML KwikPen     . hydrALAZINE (APRESOLINE) 25 MG tablet Take 25 mg by mouth 3 (three) times daily.    . hydrochlorothiazide (HYDRODIURIL) 25 MG tablet Take 25 mg by mouth every morning.     . hydrOXYzine (ATARAX/VISTARIL) 25 MG tablet Take 1 tablet (25 mg total) by mouth every 6 (six) hours as needed for anxiety or nausea. 30 tablet 0  .  Immune Globulin, Human, (CUVITRU) 4 GM/20ML SOLN Inject 100 mLs into the skin every 14 (fourteen) days.    . insulin glargine (LANTUS) 100 UNIT/ML injection Inject 60 Units into the skin at bedtime.    . lamoTRIgine (LAMICTAL) 100 MG tablet Take 50 mg by mouth at bedtime.     Marland Kitchen levothyroxine (SYNTHROID, LEVOTHROID) 112 MCG tablet Take 112 mcg by mouth daily before breakfast.    . lisinopril (PRINIVIL,ZESTRIL) 40 MG tablet Take 40 mg by mouth every morning.     . Melatonin 3 MG TABS Take 3 mg by mouth at bedtime.    . metFORMIN (GLUCOPHAGE-XR) 500 MG 24 hr tablet Take 1,000 mg by mouth  2 (two) times daily.   0  . mometasone-formoterol (DULERA) 200-5 MCG/ACT AERO Inhale 2 puffs into the lungs 2 (two) times daily. 1 Inhaler 0  . Spacer/Aero-Holding Chambers (AEROCHAMBER MV) inhaler Use as instructed 1 each 0  . tiotropium (SPIRIVA) 18 MCG inhalation capsule Place 18 mcg into inhaler and inhale daily as needed (for breathing).      No current facility-administered medications for this visit.     Past Medical History:  Diagnosis Date  . Asthma   . Atrial fibrillation ([place])   . Bipolar disorder ([place])   . Bronchiectasis ([place])   . CHF (congestive heart failure) ([place])   . Complication of anesthesia    "my Dr in [place] told me I had an allergic reaction to the combination of the pain meds and the anesthesia" she does not remember what happened but "I eneded up in the ICU for 5 days".  . Hypogammaglobulinemia ([place])   . Hypothyroid   . 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 Neuropathy   . Osteoarthritis   . PTSD (post-traumatic stress disorder)   . Recurrent sinusitis   . Short-term memory loss   . Sleep apnea   . TIA (transient ischemic attack)   . Tremors of nervous system    seeing PCP for this.  . Type 2 diabetes mellitus ([place])   . Wound infection     Past Surgical History:  Procedure Laterality Date  . 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 CARDIAC SURGERY    . CARDIOVERSION    . CARDIOVERSION N/A 08/29/2018   Procedure: CARDIOVERSION;  Surgeon: Arnoldo Lenis, MD;  Location: [facility];  Service: Endoscopy;  Laterality: N/A;  . [place]  . NASAL SINUS SURGERY  2017    Social History   Socioeconomic History  . Marital status: Widowed    Spouse name: Not on file  . Number of children: Not on file  . Years of education: Not on file  . Highest education level: Not on file  Occupational History  . Not on file  Social Needs  . Financial resource strain: Not on file  . Food insecurity:    Worry: Not on file    Inability: Not on file  .  Transportation needs:    Medical: Not on file    Non-medical: Not on file  Tobacco Use  . Smoking status: Former Smoker    Packs/day: 1.00    Years: 17.00    Pack years: 17.00    Types: Cigarettes    Last attempt to quit: 2008    Years since quitting: 12.2  . Smokeless tobacco: Never Used  Substance and Sexual Activity  . Alcohol use: Not Currently  . Drug use: Not Currently  . Sexual activity: Yes  Birth control/protection: None  Lifestyle  . Physical activity:    Days per week: Not on file    Minutes per session: Not on file  . Stress: Not on file  Relationships  . Social connections:    Talks on phone: Not on file    Gets together: Not on file    Attends religious service: Not on file    Active member of club or organization: Not on file    Attends meetings of clubs or organizations: Not on file    Relationship status: Not on file  . Intimate partner violence:    Fear of current or ex partner: Not on file    Emotionally abused: Not on file    Physically abused: Not on file    Forced sexual activity: Not on file  Other Topics Concern  . Not on file  Social History Narrative  . Not on file     Vitals:   12/26/18 0901  BP: 122/72  Pulse: 69  SpO2: 96%  Weight: 252 lb (114.3 kg)  Height: 5\' 4"  (1.626 m)    Wt Readings from Last 3 Encounters:  12/26/18 252 lb (114.3 kg)  11/30/18 251 lb 12.8 oz (114.2 kg)  10/24/18 253 lb (114.8 kg)     PHYSICAL EXAM General: NAD HEENT: Normal. Neck: No JVD, no thyromegaly. Lungs: Poor air movement, faint scattered end expiratory wheezes. CV: Regular rate and irregular rhythm, frequent premature contractions, normal S1/S2, no S3/S4, no murmur. No pretibial or periankle edema.  Abdomen: Soft, nontender, no distention.  Neurologic: Alert and oriented.  Psych: Normal affect. Skin: Normal. Musculoskeletal: No gross deformities.    ECG: Reviewed above under Subjective   Labs: Lab Results  Component Value  Date/Time   K 4.5 10/12/2018 01:03 PM   BUN 23 (H) 10/12/2018 01:03 PM   CREATININE 0.60 10/12/2018 01:03 PM   CREATININE 0.51 03/13/2018 10:34 AM   ALT 18 10/12/2018 01:03 PM   TSH 1.00 03/13/2018 10:34 AM   HGB 13.2 10/12/2018 01:03 PM   HGB 13.5 02/08/2018 09:14 AM     Lipids: Lab Results  Component Value Date/Time   LDLCALC 46 08/11/2018 02:13 AM   CHOL 111 08/11/2018 02:13 AM   TRIG 165 (H) 08/11/2018 02:13 AM   HDL 32 (L) 08/11/2018 02:13 AM       ASSESSMENT AND PLAN:  1.  Paroxysmal atrial fibrillation and flutter: Status post direct-current cardioversion for atrial flutter on 08/29/2018.  Event monitoring in December 2019 showed no recurrence of atrial fibrillation or flutter with occasional PACs and PVCs. Symptomatically stable on long-acting diltiazem 300 mg daily.  Systemically anticoagulated with Eliquis 5 mg twice daily.    2.  History of ASD repair: Most recent echocardiogram in November 2019 showed no shunting, normal left ventricular systolic function, and severe LVH.  3.  Obstructive sleep apnea: Followed by pulmonology.  4.  Bronchiectasis and asthma: She is in the process of obtaining Biolog[employer].  5.  Common variable immunodeficiency: On immunoglobulin and followed by hematology.  6.  Hypertension: Blood pressure is controlled on present therapy.  No changes.  7.  Insulin-dependent diabetes mellitus: Currently on insulin and metformin.   Disposition: Follow up 6 months   Kate Sable, M.D., F.A.C.C.

## 2018-12-26 NOTE — Patient Instructions (Signed)
Medication Instructions:  Your physician recommends that you continue on your current medications as directed. Please refer to the Current Medication list given to you today.  If you need a refill on your cardiac medications before your next appointment, please call your pharmacy.   Lab work: NONE   If you have labs (blood work) drawn today and your tests are completely normal, you will receive your results only by: . MyChart Message (if you have MyChart) OR . A paper copy in the mail If you have any lab test that is abnormal or we need to change your treatment, we will call you to review the results.  Testing/Procedures: NONE   Follow-Up: At [facility], you and your health needs are our priority.  As part of our continuing mission to provide you with exceptional heart care, we have created designated Provider Care Teams.  These Care Teams include your primary Cardiologist (physician) and Advanced Practice Providers (APPs -  Physician Assistants and Nurse Practitioners) who all work together to provide you with the care you need, when you need it. You will need a follow up appointment in 6 months.  Please call our office 2 months in advance to schedule this appointment.  You may see Suresh Koneswaran, MD or one of the following Advanced Practice Providers on your designated Care Team:   Brittany Strader, PA-C ([place] Office) . Michele Lenze, PA-C ([place] Office)  Any Other Special Instructions Will Be Listed Below (If Applicable). Thank you for choosing [facility]!     

## 2018-12-31 ENCOUNTER — Telehealth: Payer: Self-pay | Admitting: Neurology

## 2018-12-31 NOTE — Telephone Encounter (Signed)
I called pt. She reports that she is doing well. I offered her a telephone visit with Dr. Rexene Alberts to discuss her concerns. Pt reports that she doesn't have any specific concerns that she wants addressed and declined a telephone visit. Pt requested an appt in July and this was rescheduled for pt. Pt verbalized understanding of new appt date and time.

## 2018-12-31 NOTE — Telephone Encounter (Signed)
Patient is calling to let you no she want be coming 01/01/2019. Please call to reschedule . Patient is fine

## 2019-01-01 ENCOUNTER — Ambulatory Visit: Payer: Medicare Other | Admitting: Neurology
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
## 2019-01-18 ENCOUNTER — Ambulatory Visit: Payer: Medicare Other | Admitting: Allergy & Immunology

## 2019-01-18 ENCOUNTER — Ambulatory Visit (INDEPENDENT_AMBULATORY_CARE_PROVIDER_SITE_OTHER): Payer: Medicare Other | Admitting: Psychiatry

## 2019-01-18 ENCOUNTER — Encounter (HOSPITAL_COMMUNITY): Payer: Self-pay | Admitting: Psychiatry

## 2019-01-18 ENCOUNTER — Other Ambulatory Visit: Payer: Self-pay

## 2019-01-18 DIAGNOSIS — F4310 Post-traumatic stress disorder, unspecified: Secondary | ICD-10-CM | POA: Diagnosis not present

## 2019-01-18 DIAGNOSIS — F0630 Mood disorder due to known physiological condition, unspecified: Secondary | ICD-10-CM

## 2019-01-18 MED ORDER — DIVALPROEX SODIUM ER 500 MG PO TB24: 1500.0000 mg | ORAL_TABLET | Freq: Two times a day (BID) | ORAL | 0 refills | Status: DC

## 2019-01-18 MED ORDER — LAMOTRIGINE 25 MG PO TABS: 25.0000 mg | ORAL_TABLET | Freq: Every day | ORAL | 0 refills | Status: DC

## 2019-01-21 ENCOUNTER — Telehealth: Payer: Self-pay | Admitting: *Deleted

## 2019-01-21 ENCOUNTER — Encounter: Payer: Self-pay | Admitting: Allergy & Immunology

## 2019-01-21 NOTE — Telephone Encounter (Signed)
Called and spoke to patient to advise her that she will have to return calls to specialty pharmacy [facility] and phone number given. Once she schedules delivery and receives medication she will need to reach out to clinic to set appt to start therapy and receive instruction on self administration

## 2019-01-23 ENCOUNTER — Ambulatory Visit: Payer: Medicare Other | Admitting: Allergy & Immunology

## 2019-01-23 ENCOUNTER — Ambulatory Visit: Payer: Self-pay | Admitting: Pulmonary Disease

## 2019-01-25 ENCOUNTER — Ambulatory Visit (HOSPITAL_COMMUNITY): Payer: Medicare Other | Admitting: Licensed Clinical Social Worker

## 2019-01-25 ENCOUNTER — Other Ambulatory Visit: Payer: Self-pay

## 2019-02-07 ENCOUNTER — Encounter (HOSPITAL_COMMUNITY): Payer: Self-pay | Admitting: Licensed Clinical Social Worker

## 2019-02-07 ENCOUNTER — Ambulatory Visit (INDEPENDENT_AMBULATORY_CARE_PROVIDER_SITE_OTHER): Payer: Medicare Other | Admitting: Licensed Clinical Social Worker

## 2019-02-07 ENCOUNTER — Other Ambulatory Visit: Payer: Self-pay

## 2019-02-07 DIAGNOSIS — F4310 Post-traumatic stress disorder, unspecified: Secondary | ICD-10-CM | POA: Diagnosis not present

## 2019-02-07 NOTE — Progress Notes (Signed)
Virtual Visit via Video Note  I connected with Laura [name] on 02/07/19 at  8:00 AM EDT by a video enabled telemedicine application and verified that I am speaking with the correct person using two identifiers.  I discussed the limitations of evaluation and management by telemedicine and the availability of in person appointments. The patient expressed understanding and agreed to proceed.   Participation Level: Active  Behavioral Response: CasualAlertDepressed  Type of Therapy: Individual Therapy  Treatment Goals addressed: Coping  Interventions: CBT and Solution Focused  Summary: Laura [name] is a 58 y.o. female who presents oriented x5 (person, place, situation, and time), casually dressed, appropriately groomed, average height, overweight, and cooperative to address mood. Patient has a history of medical treatment including hypothyroidism, hypertensions, and diabetes. Patient has a history of mental health treatment including outpatient therapy and medication management. Patient denies homicidal and suicidal ideations. Patient denies psychosis including auditory and visual hallucinations. Patient denies substance abuse. Patient is at low risk for lethality at this time.  Physically: Patient feels like she has gained weight. She has noticed an increase in her appetite. Patient's sleep continues to fluctuate.  Spiritually/values: No issues identified.  Relationships: Patient is getting along with her family. She has been experiencing thoughts of her deceased husband. She feels like due to being "bored" she has had an increase in her thoughts.  Emotionally/Mentally/Behavior:  Patient has been depressed. She has been bored and wants to isolate. Patient has lost her "motivation" to exercise. After discussion, patient was able to identify her "why" and the benefits of exercise. Patient understood that she needs to start small and set small goals for her to work on. She would get the benefits  of exercise, remove barriers, reduce boredom, etc. Patient also agreed to work on her garden rather than just sit at home.   Suicidal/Homicidal: Negativewithout intent/plan  Therapist Response: Therapist reviewed patient's recent thoughts and behaviors. Therapist utilized CBT to address mood. Therapist processed patient's feelings to identify triggers for mood. Therapist helped patient identify her thoughts related to exercise and COVID19. Therapist assisted patient in identifying small goals to accomplish to reduce boredom and improve mood.   Plan: Return again in 2-3 weeks.  Diagnosis: Axis I: Post Traumatic Stress Disorder    Axis II: No diagnosis  I discussed the assessment and treatment plan with the patient. The patient was provided an opportunity to ask questions and all were answered. The patient agreed with the plan and demonstrated an understanding of the instructions.   The patient was advised to call back or seek an in-person evaluation if the symptoms worsen or if the condition fails to improve as anticipated.  I provided 40 minutes of non-face-to-face time during this encounter.   Glori Bickers, LCSW 02/07/2019

## 2019-02-08 ENCOUNTER — Ambulatory Visit: Payer: Self-pay
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
## 2019-02-15 ENCOUNTER — Other Ambulatory Visit: Payer: Self-pay

## 2019-02-15 ENCOUNTER — Encounter (HOSPITAL_COMMUNITY): Payer: Self-pay | Admitting: Psychiatry

## 2019-02-15 ENCOUNTER — Ambulatory Visit (INDEPENDENT_AMBULATORY_CARE_PROVIDER_SITE_OTHER): Payer: Medicare Other | Admitting: Psychiatry

## 2019-02-15 ENCOUNTER — Ambulatory Visit (HOSPITAL_COMMUNITY): Payer: Medicaid Other | Admitting: Licensed Clinical Social Worker

## 2019-02-15 DIAGNOSIS — F4310 Post-traumatic stress disorder, unspecified: Secondary | ICD-10-CM

## 2019-02-15 DIAGNOSIS — F1021 Alcohol dependence, in remission: Secondary | ICD-10-CM

## 2019-02-15 DIAGNOSIS — F0630 Mood disorder due to known physiological condition, unspecified: Secondary | ICD-10-CM | POA: Diagnosis not present

## 2019-02-15 MED ORDER — LAMOTRIGINE 25 MG PO TABS: 25.0000 mg | ORAL_TABLET | Freq: Every day | ORAL | 0 refills | Status: DC

## 2019-02-22 ENCOUNTER — Ambulatory Visit (INDEPENDENT_AMBULATORY_CARE_PROVIDER_SITE_OTHER): Payer: Medicare Other | Admitting: Allergy & Immunology

## 2019-02-22 ENCOUNTER — Ambulatory Visit: Payer: Medicare Other

## 2019-02-22 ENCOUNTER — Encounter: Payer: Self-pay | Admitting: Allergy & Immunology

## 2019-02-22 ENCOUNTER — Other Ambulatory Visit: Payer: Self-pay

## 2019-02-22 VITALS — BP 118/82 | HR 72 | Temp 98.2°F | Resp 16 | Ht 64.0 in | Wt 253.0 lb

## 2019-02-22 DIAGNOSIS — J4550 Severe persistent asthma, uncomplicated: Secondary | ICD-10-CM

## 2019-02-22 DIAGNOSIS — F319 Bipolar disorder, unspecified: Secondary | ICD-10-CM | POA: Diagnosis not present

## 2019-02-22 DIAGNOSIS — J479 Bronchiectasis, uncomplicated: Secondary | ICD-10-CM | POA: Diagnosis not present

## 2019-02-22 DIAGNOSIS — J310 Chronic rhinitis: Secondary | ICD-10-CM

## 2019-02-22 DIAGNOSIS — D839 Common variable immunodeficiency, unspecified: Secondary | ICD-10-CM | POA: Diagnosis not present

## 2019-02-22 DIAGNOSIS — R918 Other nonspecific abnormal finding of lung field: Secondary | ICD-10-CM | POA: Diagnosis not present

## 2019-02-22 MED ORDER — EPINEPHRINE 0.3 MG/0.3ML IJ SOAJ: 0.3000 mg | Freq: Once | INTRAMUSCULAR | 1 refills | Status: DC

## 2019-02-22 MED ORDER — BENRALIZUMAB 30 MG/ML ~~LOC~~ SOSY
30.0000 mg | PREFILLED_SYRINGE | Freq: Once | SUBCUTANEOUS | Status: AC
  Administered 2019-02-22: 10:00:00 30 mg via SUBCUTANEOUS

## 2019-02-22 NOTE — Progress Notes (Signed)
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 She also received her first dose of Fasenra today, which she tolerated well.  I am hopeful that this will help her to have fewer asthma symptoms and increase her physical activity.  I did emphasize to her that Dr. [place] [name] and I were on the same page regarding initiation of Fasenra.  We again discussed the risk and benefits of the medication.  We are going to send in an epinephrine autoinjector since she is on a biologic.  We will not make any changes to her asthma medications at this point, but I am hopeful we can come off of or at least decrease the dose of her inhaled medications.  We are going to get an IgG level, metabolic panel, and complete blood count as part of her quarterly labs for her immunoglobulin replacement.  We are to continue with the same dose for now since it seems to be working with regards to preventing infections.  She has not needed antibiotics in at least 6 months, likely more.  She did have a HRCT earlier this year which actually looked quite good without evidence of interstitial lung disease and no nodules present.   Her tremor seems to be a little bit worse today, although I think she was anxious about receiving the Fasenra.  That probably contributed to her jitteriness.  He has been followed closely by neurology for this.   Regarding her mental health, she seems to be right on track.  She is very interactive today, probably the most interactive that have ever seen her.  I did get some smiles out of her as well.  She seems to have developed strong social support network in the area.      Plan/Recommendations:   1. Hypogammaglobulinemia - Continue with Cuvitru as you are doing.  - We will recheck an IgG level, metabolic panel, and a complete blood count. - You are doing a great job with this.   2. Chronic rhinitis   - Continue with nasal saline rinses as needed.   3. Severe persistent asthma, uncomplicated - You started the [place] today. - You will get it monthly for two more doses and then every 8 weeks thereafter. - I am hopeful that this will help with your endurance and activity level.  - Daily controller medication(s): Dulera 200/68mcg two puffs twice daily with spacer and Spiriva 1.49mcg two puffs once daily and Fasenra monthly for two more doses and then every weeks - Prior to physical activity: ProAir 2 puffs 10-15 minutes before physical activity. - Rescue medications: ProAir 4 puffs every 4-6 hours as needed, albuterol nebulizer one vial every 4-6 hours as needed or DuoNeb nebulizer one vial every 4-6 hours as needed - Asthma control goals:  * Full participation in all desired activities (may need albuterol before activity) * Albuterol use two time or less a week on average (not counting use with activity) * Cough interfering with sleep two time  or less a month * Oral steroids no more than once a year * No hospitalizations.  4. Return in about 4 weeks (around 03/22/2019).     Subjective:   Laura [name] is a 58 y.o. female presenting today for follow up of  Chief Complaint  Patient presents with  . Asthma    Laura [name] has a history of the following: Patient Active Problem List   Diagnosis Date Noted  . Atypical chest pain 08/10/2018  . Bipolar disorder ([place]) 08/10/2018  .  Headache 08/10/2018  . H/O atrial flutter 08/10/2018  . OSA on CPAP 08/10/2018  . Anxiety 08/10/2018  . Uncontrolled type 2 diabetes mellitus with hyperglycemia ([place]) 03/13/2018  . Hypothyroidism 03/13/2018  . Essential hypertension, benign 03/13/2018  . Mixed hyperlipidemia 03/13/2018  . Hypogammaglobulinemia ([place]) 02/06/2018  . Non-allergic rhinitis 02/06/2018  . Severe persistent asthma, uncomplicated 28/41/3244  . Pulmonary nodules 02/06/2018  . Bronchiectasis without complication ([place]) 10/12/7251  . DM type 2 causing vascular disease ([place]) 02/06/2018    History obtained from: chart review and patient.  PCP: Dr. Meade [name] Psychiatrist: Dr. Norman [name] Cardiologist: Dr. Kate [name] Endocrinologist: Dr. Loni [name] Pulmonologist: Dr. Simonne [name] Orthopedic Surgeon:Dr. Raedyn [name] is a 58 y.o. female presenting for a follow up visit.  She was last seen in February 2020.  At that time, we spent the time discussing the addition of a biologic to her asthma regimen.  She was not requiring prednisone often for her asthma, but she was requiring the use of her rescue inhaler much more often, upwards of 2-3 times per day.  That had altered her physical activity, which has led to her gaining some weight.  We plan to start mepolizumab due to its longer safety profile, but it turned out that benralizumab was easier to get approved.  We had a long discussion regarding the risk and benefits of Biologics and she decided to proceed.  We continued her on q. the true at her current dose that she was doing so well on this.  She is going to [place] this weekend for a camp out. It is only around 8 people in total and they are bringing campers instead of tents. She does tell me that she is being careful about the coronavirus.    Asthma/Respiratory Symptom History: She remains on Dulera 200/5 mcg 2 puffs twice daily in combination with Spiriva 1.25 mcg 2 puffs once daily.   On this regimen, she has done fairly well.  She continues to need albuterol 2-3 times per day.  She did bring the [place] with her today and she is slightly nervous about receiving the injection, but overall she is optimistic with its effects.  She has needed no prednisone since last visit.  She has not been to the ER.  She has been taking more precautions with regards to exposure to coronavirus and she does wear a mask when she is out in public.  Allergic Rhinitis Symptom History: She remains on her nasal saline rinses as needed.  She has not needed antibiotics at all since last visit for sinusitis or any other issues.  She denies any nasal congestion or rhinorrhea.  Infectious Disease Symptom History: She remains on Cuvitru 20 g every 2 weeks.  She does the entire infusion herself and feels very confident with this.  She has had no antibiotics since last visit.  She does use a mask when she is in public.  Repeat HRCT was actually completely normal without  evidence of interstitial ulng disease.   Otherwise, there have been no changes to her past medical history, surgical history, family history, or social history.  HRCT (March 2020)  IMPRESSION: 1. No evidence of interstitial lung disease. 2. Nonspecific diffuse bronchial wall thickening, as can be seen with chronic bronchitis and/or reactive airways disease.    Review of Systems  Constitutional: Negative.  Negative for fever, malaise/fatigue and weight loss.  HENT: Negative.  Negative for congestion, ear discharge and ear pain.   Eyes: Negative for pain, discharge and redness.  Respiratory: Positive for cough and shortness of breath. Negative for sputum production and wheezing.   Cardiovascular: Negative.  Negative for chest pain and palpitations.  Gastrointestinal: Negative for abdominal pain and heartburn.  Skin: Negative.  Negative for itching and rash.  Neurological: Negative for dizziness and headaches.  Endo/Heme/Allergies: Negative  for environmental allergies. Does not bruise/bleed easily.       Objective:   Blood pressure 118/82, pulse 72, temperature 98.2 F (36.8 C), temperature source Oral, resp. rate 16, height 5\' 4"  (1.626 m), weight 253 lb (114.8 kg), SpO2 96 %. Body mass index is 43.43 kg/m.   Physical Exam:  Physical Exam  Constitutional: She appears well-developed.  Well appearing.   HENT:  Head: Normocephalic and atraumatic.  Right Ear: Tympanic membrane, external ear and ear canal normal. No drainage, swelling or tenderness. Tympanic membrane is not injected, not scarred, not erythematous, not retracted and not bulging.  Left Ear: Tympanic membrane, external ear and ear canal normal. No drainage, swelling or tenderness. Tympanic membrane is not injected, not scarred, not erythematous, not retracted and not bulging.  Nose: No mucosal edema, rhinorrhea, nasal deformity or septal deviation. No epistaxis. Right sinus exhibits no maxillary sinus tenderness and no frontal sinus tenderness. Left sinus exhibits no maxillary sinus tenderness and no frontal sinus tenderness.  Mouth/Throat: Uvula is midline and oropharynx is clear and moist. Mucous membranes are not pale and not dry.  Eyes: Pupils are equal, round, and reactive to light. Conjunctivae and EOM are normal. Right eye exhibits no chemosis and no discharge. Left eye exhibits no chemosis and no discharge. Right conjunctiva is not injected. Left conjunctiva is not injected.  Cardiovascular: Normal rate, regular rhythm and normal heart sounds.  Respiratory: Effort normal and breath sounds normal. No accessory muscle usage. No tachypnea. No respiratory distress. She has no wheezes. She has no rhonchi. She has no rales. She exhibits no tenderness.  Decreased air movement at the bases.   GI: There is no abdominal tenderness. There is no rebound and no guarding.  Lymphadenopathy:       Head (right side): No submandibular, no tonsillar and no occipital  adenopathy present.       Head (left side): No submandibular, no tonsillar and no occipital adenopathy present.    She has no cervical adenopathy.  Neurological: She is alert.  Skin: No abrasion, no petechiae and no rash noted. Rash is not papular, not vesicular and not urticarial. No erythema. No pallor.  No eczematous or urticarial lesions noted.  Psychiatric: She has a normal mood and affect.     Diagnostic studies: none      Salvatore Marvel, MD  [facility]

## 2019-02-22 NOTE — Patient Instructions (Addendum)
1. Hypogammaglobulinemia - Continue with Cuvitru as you are doing.  - We will recheck an IgG level, metabolic panel, and a complete blood count. - You are doing a great job with this.   2. Chronic rhinitis   - Continue with nasal saline rinses as needed.   3. Severe persistent asthma, uncomplicated - You started the [place] today. - You will get it monthly for two more doses and then every 8 weeks thereafter. - I am hopeful that this will help with your endurance and activity level.  - Daily controller medication(s): Dulera 200/73mcg two puffs twice daily with spacer and Spiriva 1.48mcg two puffs once daily and Fasenra monthly for two more doses and then every weeks - Prior to physical activity: ProAir 2 puffs 10-15 minutes before physical activity. - Rescue medications: ProAir 4 puffs every 4-6 hours as needed, albuterol nebulizer one vial every 4-6 hours as needed or DuoNeb nebulizer one vial every 4-6 hours as needed - Asthma control goals:  * Full participation in all desired activities (may need albuterol before activity) * Albuterol use two time or less a week on average (not counting use with activity) * Cough interfering with sleep two time or less a month * Oral steroids no more than once a year * No hospitalizations.  4. Return in about 4 weeks (around 03/22/2019).   Please inform us of any Emergency Department visits, hospitalizations, or changes in symptoms. Call us before going to the ED for breathing or allergy symptoms since we might be able to fit you in for a sick visit. Feel free to contact us anytime with any questions, problems, or concerns.  It was a pleasure to see you again today!  Websites that have reliable patient information: 1. American Academy of Asthma, Allergy, and Immunology: www.aaaai.org 2. Food Allergy Research and Education (FARE): foodallergy.org 3. Mothers of Asthmatics: http://www.asthmacommunitynetwork.org 4. American College of Allergy, Asthma,  and Immunology: MonthlyElectricBill.co.uk   Make sure you are registered to vote! If you have moved or changed any of your contact information, you will need to get this updated before voting!    Voter ID laws are NOT going into effect for the General Election in November 2020! DO NOT let this stop you from exercising your right to vote!

## 2019-02-22 NOTE — Progress Notes (Deleted)
Peanut Oral Immunotherapy Updosing:  Date of Service/Encounter:  02/22/19   Assessment:   Severe persistent asthma without complication - Plan: Benralizumab SOSY 30 mg  Common variable immunodeficiency (HCC) - Plan: CBC w/Diff/Platelet, IgG, CMP14+EGFR  Plan/Recommendations:    Patient Instructions  1. Hypogammaglobulinemia - Continue with Cuvitru as you are doing.  - We will recheck an IgG level, metabolic panel, and a complete blood count. - You are doing a great job with this.   2. Chronic rhinitis   - Continue with nasal saline rinses as needed.   3. Severe persistent asthma, uncomplicated - You started the [place] today. - You will get it monthly for two more doses and then every 8 weeks thereafter. - I am hopeful that this will help with your endurance and activity level.  - Daily controller medication(s): Dulera 200/38mg two puffs twice daily with spacer and Spiriva 1.259m two puffs once daily and Fasenra monthly for two more doses and then every weeks - Prior to physical activity: ProAir 2 puffs 10-15 minutes before physical activity. - Rescue medications: ProAir 4 puffs every 4-6 hours as needed, albuterol nebulizer one vial every 4-6 hours as needed or DuoNeb nebulizer one vial every 4-6 hours as needed - Asthma control goals:  * Full participation in all desired activities (may need albuterol before activity) * Albuterol use two time or less a week on average (not counting use with activity) * Cough interfering with sleep two time or less a month * Oral steroids no more than once a year * No hospitalizations.  4. Return in about 4 weeks (around 03/22/2019).   Please inform usKoreaf any Emergency Department visits, hospitalizations, or changes in symptoms. Call usKoreaefore going to the ED for breathing or allergy symptoms since we might be able to fit you in for a sick visit. Feel free to contact usKoreanytime with any questions, problems, or concerns.  It was a  pleasure to see you again today!  Websites that have reliable patient information: 1. American Academy of Asthma, Allergy, and Immunology: www.aaaai.org 2. Food Allergy Research and Education (FARE): foodallergy.org 3. Mothers of Asthmatics: http://www.asthmacommunitynetwork.org 4. American College of Allergy, Asthma, and Immunology: wwMonthlyElectricBill.co.uk Make sure you are registered to vote! If you have moved or changed any of your contact information, you will need to get this updated before voting!    Voter ID laws are NOT going into effect for the General Election in November 2020! DO NOT let this stop you from exercising your right to vote!          Subjective:   Laura Pollinas a [age].o. female presenting today for follow up of  Chief Complaint  Patient presents with  . Asthma    TaIdamay Hoseinas a history of the following: Patient Active Problem List   Diagnosis Date Noted  . Atypical chest pain 08/10/2018  . Bipolar disorder (HCKillona11/10/2017  . Headache 08/10/2018  . H/O atrial flutter 08/10/2018  . OSA on CPAP 08/10/2018  . Anxiety 08/10/2018  . Uncontrolled type 2 diabetes mellitus with hyperglycemia (HCBurke06/01/2018  . Hypothyroidism 03/13/2018  . Essential hypertension, benign 03/13/2018  . Mixed hyperlipidemia 03/13/2018  . Hypogammaglobulinemia (HCHamlin04/30/2019  . Non-allergic rhinitis 02/06/2018  . Severe persistent asthma, uncomplicated 0465/12/5463. Pulmonary nodules 02/06/2018  . Bronchiectasis without complication (HCBeckham0468/09/7516. DM type 2 causing vascular disease (HCLowesville04/30/2019    History obtained from: chart review and ***.  Jonelle Sidle Seefeldt's Primary Care Provider is [name], MD.     Tarra Pence is a 58 y.o. female presenting to increase her peanut OIT dose. She completed the peanut rapid escalation in {Blank  single:19197::"January","February","March","April","May","June","July","August","September","October","November","December"} of ***. Her current dose is {Blank single:19197::"1 mL","1.59m","2 mL","4 mL","6 mL","8 mL"," "} {Blank single:19197::"2.5 mcg/mL peanut suspension ","25 mcg/mL peanut suspension ","250 mcg/mL peanut suspension ","2.5 mg/mL peanut suspension ","25 mg/mL peanut suspension "," "} {Blank single:19197::"64 mg peanut flour","128 mg peanut flour","192 mg peanut flour","257 mg peanut flour","385 mg peanut flour","950 mg whole peanuts (1 peanut)","1900 mg whole peanuts (2 peanuts)","2850 mg whole peanuts (3 peanuts)","3800 mg whole peanuts (4 peanuts)","5700 mg whole peanuts (6 peanuts)","7600 mg whole peanuts (8 peanuts)"}. Pamella tolerated her dose {Blank single:19197::"with","without"} {Blank multiple:19196:o:"oral itching","stomach pain","diarrhea","vomiting","itching","hives"}.   She denies any symptoms of eosinophilic esophagitis, including {Blank multiple:19196:o:"reflux","stomach pain","difficulty swallowing","weight loss","chest pain"}.    Otherwise, there have been no changes to her past medical history, surgical history, family history, or social history.    Review of Systems: a 14-point review of systems is pertinent for what is mentioned in HPI.  Otherwise, all other systems were negative.  Constitutional: negative other than that listed in the HPI Eyes: negative other than that listed in the HPI Ears, nose, mouth, throat, and face: negative other than that listed in the HPI Respiratory: negative other than that listed in the HPI Cardiovascular: negative other than that listed in the HPI Gastrointestinal: negative other than that listed in the HPI Genitourinary: negative other than that listed in the HPI Integument: negative other than that listed in the HPI Hematologic: negative other than that listed in the HPI Musculoskeletal: negative other than that listed in the  HPI Neurological: negative other than that listed in the HPI Allergy/Immunologic: negative other than that listed in the HPI    Objective:   Blood pressure 118/82, pulse 72, temperature 98.2 F (36.8 C), temperature source Oral, resp. rate 16, height '5\' 4"'$  (1.626 m), weight 253 lb (114.8 kg), SpO2 96 %. Body mass index is 43.43 kg/m.   Physical Exam:  General: Alert, interactive, in no acute distress. Eyes: {Blank multiple:19196::"No conjunctival injection present on the right","No conjunctival injection present on the left","No conjunctival injection bilaterally","Conjunctival injection on the right with limbal sparing","Conjunctival injection on the left with limbal sparing","Conjunctival injection bilaterally with limbal sparing","no discharge on the right","no discharge on the left","no Horner-Trantas dots present","allergic shiners present bilaterally","***"}. PERRL bilaterally. EOMI without pain. No photophobia.  Ears: {Blank multiple:19196::"Right TM pearly gray with normal light reflex","Left TM pearly gray with normal light reflex","Right TM erythematous but not bulging","Left TM erythematous but not bulging","Right TM erythematous and bulging","Left TM erythematous and bulging","Right OME","Left OME","Right TM intact without perforation","Left TM intact without perforation","Right TM unable to be visualized due to cerumen impaction","Left TM unable to be visualized due to cerumen impaction","Patent tympanostomy tube present on the right","Patent tympanostomy tube present on the left","***"}.  Nose/Throat: {Blank multiple:19196::"External nose within normal limits","nasal crease present","septum midline"}. Turbinates {Blank single:19197::"non-edematous","edematous","edematous and pale","markedly edematous","markedly edematous and pale","moderately edematous","mildly edematous","minimally edematous"} {Blank single:19197::"with crusty discharge","with thick discharge","with clear  discharge","without discharge"}. Posterior oropharynx {Blank single:19197::"unremarkable","non erythematous","erythematous","markedly erythematous","moderately erythematous","mildly erythematous"} {Blank single:19197::"with cobblestoning in the posterior oropharynx","without cobblestoning in the posterior oropharynx"}. Tonsils {Blank single:19197::"surgically absent","unremarklable","3+","4+","touching","2+"} {Blank single:19197::"with exudates","without exudates"}.  {Blank multiple:19196::"Tongue without thrush","Geographic tongue present"}. Lungs: {Blank single:19197::"Decreased breath sounds with expiratory wheezing bilaterally","Mildly decreased breath sounds with expiratory wheezing bilaterally","Decreased breath sounds bilaterally without wheezing, rhonchi or rales","Mildly decreased breath sounds bilaterally without wheezing, rhonchi or rales","Clear to auscultation  without wheezing, rhonchi or rales"}. {Blank single:19197::"Increased work of breathing","No increased work of breathing"}. CV: {Blank single:19197::"Physiologic splitting of S1/S2","Normal S1/S2"}. No murmurs. Capillary refill <2 seconds.  Skin: {Blank single:19197::"Dry, erythematous, excoriated patches on the ***","Dry, hyperpigmented, thickened patches on the ***","Dry, mildly hyperpigmented, mildly thickened patches on the ***","Scattered erythematous urticarial type lesions primarily located *** , nonvesicular","Warm and dry, without lesions or rashes"}. Neuro:   Grossly intact. No focal deficits appreciated. Responsive to questions.    Spirometry: {Blank single:19197::"results normal","FEV1: ***, FVC: ***, ratio consistent with ***"}  Rescue Medications (if needed):  Epinephrine dose: {Blank single:19197::"0.15 mg","0.3 mg"} Benadryl dose: {Blank single:19197::"12.5 mg (5 mL)","25 mg (10 mL)","37.5 mL (15 mL)","50 mg (20 mL)"}  [place]hay was given {Blank single:19197::"1 mL","1.39m","2 mL","4 mL","6 mL","8 mL"," "} {Blank  single:19197::"2.5 mcg/mL peanut suspension ","25 mcg/mL peanut suspension ","250 mcg/mL peanut suspension ","2.5 mg/mL peanut suspension ","25 mg/mL peanut suspension "," "} {Blank single:19197::"64 mg peanut peanut flour","128 mg peanut peanut flour","192 mg peanut peanut flour","257 mg peanut peanut flour","385 mg peanut peanut flour","1 peanut","2 peanuts","4 peanuts","6 peanuts","8 peanuts","10 peanuts","12 peanuts"," "}.  Time TEganwas given the dose: ***:*** {Blank single:19197::"AM","PM"} Time TTomasinawas discharged: ***:*** {Blank single:19197::"AM","PM"}  Given the up dosing today, TLeanorawill be sent home with the following dose: {Blank single:19197::"1 mL","1.525m,"2 mL","4 mL","6 mL","8 mL"," "} {Blank single:19197::"2.5 mcg/mL peanut suspension ","25 mcg/mL peanut suspension ","250 mcg/mL peanut suspension ","2.5 mg/mL peanut suspension ","25 mg/mL peanut suspension "," "} {Blank single:19197::"64 mg peanut peanut flour","128 mg peanut peanut flour","192 mg peanut peanut flour","257 mg peanut peanut flour","385 mg peanut peanut flour","1 peanut","2 peanuts","4 peanuts","6 peanuts","8 peanuts","10 peanuts","12 peanuts"," "}.

## 2019-02-22 NOTE — Progress Notes (Signed)
Immunotherapy   Patient Details  Name: Laura [name] MRN: [id number] Date of Birth: 1961/08/29  02/22/2019  Laura [name] started injections for  Laura [name] auto injector 30mg /ML Following schedule: 4  Weeks  Epi-Pen:Prescription for Epi-Pen given at time of appt. Consent signed and patient instructions given. Patient was given shot and waited in the room during her office visit for approximately 30 min. Patient had no c/o any problems after receiving her injection and is in stable condition. Advise patient she must bring in her Epi pen each time she comes for her appointment.   Laura [name] 02/22/2019, 10:18 AM

## 2019-02-23 ENCOUNTER — Encounter: Payer: Self-pay | Admitting: Allergy & Immunology

## 2019-03-01 ENCOUNTER — Ambulatory Visit: Payer: Medicare Other | Admitting: Allergy & Immunology

## 2019-03-08 ENCOUNTER — Ambulatory Visit (INDEPENDENT_AMBULATORY_CARE_PROVIDER_SITE_OTHER): Payer: Medicare Other | Admitting: Licensed Clinical Social Worker

## 2019-03-08 ENCOUNTER — Encounter (HOSPITAL_COMMUNITY): Payer: Self-pay | Admitting: Licensed Clinical Social Worker

## 2019-03-08 ENCOUNTER — Other Ambulatory Visit: Payer: Self-pay

## 2019-03-08 DIAGNOSIS — F4310 Post-traumatic stress disorder, unspecified: Secondary | ICD-10-CM

## 2019-03-08 NOTE — Progress Notes (Signed)
Virtual Visit via Video Note  I connected with Fredrika Canby on 03/08/19 at 10:00 AM EDT by a video enabled telemedicine application and verified that I am speaking with the correct person using two identifiers.  I discussed the limitations of evaluation and management by telemedicine and the availability of in person appointments. The patient expressed understanding and agreed to proceed.   Participation Level: Active  Behavioral Response: CasualAlertDepressed  Type of Therapy: Individual Therapy  Treatment Goals addressed: Coping  Interventions: CBT and Solution Focused  Summary: Laura [name] is a 58 y.o. female who presents oriented x5 (person, place, situation, and time), casually dressed, appropriately groomed, average height, overweight, and cooperative to address mood. Patient has a history of medical treatment including hypothyroidism, hypertensions, and diabetes. Patient has a history of mental health treatment including outpatient therapy and medication management. Patient denies homicidal and suicidal ideations. Patient denies psychosis including auditory and visual hallucinations. Patient denies substance abuse. Patient is at low risk for lethality at this time.  Physically: Patient continues to struggle with her weight and appetite. After discussion, patient understood that she can take small steps such as tracking what she ate that can impact her health.  Spiritually/values: No issues identified.  Relationships: Patient is getting along with her family. She continues to miss her husband and feels lonely.  Emotionally/Mentally/Behavior:  Patient has been depressed. Patient continues to struggle with motivation. After discussion, patient understood that she needs to remember her "why" of working on her physical and mental health. Patient understood that her "why" can help her through times of not wanting to do things or lacking motivation. Patient feels stuck with COVID19 and is  looking forward to when things open up again.    Suicidal/Homicidal: Negativewithout intent/plan  Therapist Response: Therapist reviewed patient's recent thoughts and behaviors. Therapist utilized CBT to address mood. Therapist processed patient's feelings to identify triggers for mood. Therapist discussed with patient identifying her "why" of doing exercise and focusing on her mental health to challenge her lack of motivation.   Plan: Return again in 2-3 weeks.  Diagnosis: Axis I: Post Traumatic Stress Disorder    Axis II: No diagnosis  I discussed the assessment and treatment plan with the patient. The patient was provided an opportunity to ask questions and all were answered. The patient agreed with the plan and demonstrated an understanding of the instructions.   The patient was advised to call back or seek an in-person evaluation if the symptoms worsen or if the condition fails to improve as anticipated.  I provided 40 minutes of non-face-to-face time during this encounter.   Glori Bickers, LCSW 03/08/2019

## 2019-03-15 ENCOUNTER — Ambulatory Visit (HOSPITAL_COMMUNITY): Payer: Medicare Other | Admitting: Licensed Clinical Social Worker

## 2019-03-15 DIAGNOSIS — E1140 Type 2 diabetes mellitus with diabetic neuropathy, unspecified: Secondary | ICD-10-CM | POA: Diagnosis not present

## 2019-03-15 DIAGNOSIS — I10 Essential (primary) hypertension: Secondary | ICD-10-CM | POA: Diagnosis not present

## 2019-03-15 DIAGNOSIS — D839 Common variable immunodeficiency, unspecified: Secondary | ICD-10-CM | POA: Diagnosis not present

## 2019-03-15 DIAGNOSIS — E039 Hypothyroidism, unspecified: Secondary | ICD-10-CM | POA: Diagnosis not present

## 2019-03-15 DIAGNOSIS — E782 Mixed hyperlipidemia: Secondary | ICD-10-CM | POA: Diagnosis not present

## 2019-03-15 DIAGNOSIS — E1142 Type 2 diabetes mellitus with diabetic polyneuropathy: Secondary | ICD-10-CM | POA: Diagnosis not present

## 2019-03-16 LAB — CMP14+EGFR
ALT: 12 IU/L (ref 0–32)
AST: 13 IU/L (ref 0–40)
Albumin/Globulin Ratio: 1.9 (ref 1.2–2.2)
Albumin: 4.5 g/dL (ref 3.8–4.9)
Alkaline Phosphatase: 77 IU/L (ref 39–117)
BUN/Creatinine Ratio: 29 — ABNORMAL HIGH (ref 9–23)
BUN: 30 mg/dL — ABNORMAL HIGH (ref 6–24)
Bilirubin Total: 0.4 mg/dL (ref 0.0–1.2)
CO2: 21 mmol/L (ref 20–29)
Calcium: 9.5 mg/dL (ref 8.7–10.2)
Chloride: 98 mmol/L (ref 96–106)
Creatinine, Ser: 1.04 mg/dL — ABNORMAL HIGH (ref 0.57–1.00)
GFR calc Af Amer: 69 mL/min/{1.73_m2} (ref >59)
GFR calc non Af Amer: 60 mL/min/{1.73_m2} (ref >59)
Globulin, Total: 2.4 g/dL (ref 1.5–4.5)
Glucose: 139 mg/dL — ABNORMAL HIGH (ref 65–99)
Potassium: 4.7 mmol/L (ref 3.5–5.2)
Sodium: 140 mmol/L (ref 134–144)
Total Protein: 6.9 g/dL (ref 6.0–8.5)

## 2019-03-16 LAB — CBC WITH DIFFERENTIAL/PLATELET
Basophils Absolute: 0 10*3/uL (ref 0.0–0.2)
Basos: 0 %
EOS (ABSOLUTE): 0 10*3/uL (ref 0.0–0.4)
Eos: 0 %
Hematocrit: 41.1 % (ref 34.0–46.6)
Hemoglobin: 13.8 g/dL (ref 11.1–15.9)
Immature Grans (Abs): 0.1 10*3/uL (ref 0.0–0.1)
Immature Granulocytes: 1 %
Lymphocytes Absolute: 2.1 10*3/uL (ref 0.7–3.1)
Lymphs: 38 %
MCH: 33 pg (ref 26.6–33.0)
MCHC: 33.6 g/dL (ref 31.5–35.7)
MCV: 98 fL — ABNORMAL HIGH (ref 79–97)
Monocytes Absolute: 0.5 10*3/uL (ref 0.1–0.9)
Monocytes: 10 %
Neutrophils Absolute: 2.8 10*3/uL (ref 1.4–7.0)
Neutrophils: 51 %
Platelets: 180 10*3/uL (ref 150–450)
RBC: 4.18 x10E6/uL (ref 3.77–5.28)
RDW: 13.1 % (ref 11.7–15.4)
WBC: 5.6 10*3/uL (ref 3.4–10.8)

## 2019-03-16 LAB — IGG: IgG (Immunoglobin G), Serum: 1106 mg/dL (ref 586–1602)

## 2019-03-22 ENCOUNTER — Ambulatory Visit: Payer: Self-pay

## 2019-03-22 ENCOUNTER — Encounter: Payer: Self-pay | Admitting: Allergy & Immunology

## 2019-03-22 ENCOUNTER — Ambulatory Visit: Payer: Medicare Other | Admitting: *Deleted

## 2019-03-22 ENCOUNTER — Ambulatory Visit (INDEPENDENT_AMBULATORY_CARE_PROVIDER_SITE_OTHER): Payer: Medicare Other | Admitting: Allergy & Immunology

## 2019-03-22 ENCOUNTER — Other Ambulatory Visit: Payer: Self-pay

## 2019-03-22 VITALS — BP 124/78 | HR 85 | Temp 98.1°F | Resp 18

## 2019-03-22 DIAGNOSIS — J310 Chronic rhinitis: Secondary | ICD-10-CM

## 2019-03-22 DIAGNOSIS — J479 Bronchiectasis, uncomplicated: Secondary | ICD-10-CM

## 2019-03-22 DIAGNOSIS — J4550 Severe persistent asthma, uncomplicated: Secondary | ICD-10-CM | POA: Diagnosis not present

## 2019-03-22 DIAGNOSIS — D839 Common variable immunodeficiency, unspecified: Secondary | ICD-10-CM | POA: Diagnosis not present

## 2019-03-22 MED ORDER — BENRALIZUMAB 30 MG/ML ~~LOC~~ SOSY
30.0000 mg | PREFILLED_SYRINGE | Freq: Once | SUBCUTANEOUS | Status: AC
  Administered 2019-03-22: 30 mg via SUBCUTANEOUS

## 2019-03-22 NOTE — Progress Notes (Signed)
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 Hypogammaglobulinemia - Continue with Cuvitru at the same dose. - Your latest IgG level was normal and you have not had an infection in quite some time. - I think we are headed in the right direction.    2. Chronic rhinitis   - Continue with nasal saline rinses as needed.   3. Severe persistent asthma, uncomplicated - We will give some time for the Berna Bue to work.  - You will get it monthly for one more dose and then every 8 weeks thereafter. - I am hopeful that this will help with your endurance and activity level.  - Daily controller medication(s): Dulera 200/62mcg two puffs twice daily with spacer and Spiriva 1.14mcg two puffs once daily and Fasenra monthly for one more dose and then every 8 weeks - Prior to physical activity: ProAir 2 puffs 10-15 minutes before physical activity. - Rescue medications: ProAir 4 puffs every 4-6 hours as needed, albuterol nebulizer one vial every 4-6 hours as needed or DuoNeb nebulizer one vial every 4-6 hours as needed - Asthma control goals:  * Full participation in all desired activities (may need albuterol before activity) * Albuterol use two time or less a week on average (not counting use with activity) * Cough interfering with sleep two time or less a month * Oral steroids no  more than once a year * No hospitalizations.  4. Return in about 3 months (around 06/22/2019). This can be an in-person, a virtual Webex or a telephone follow up visit.   Subjective:   Laura [name] is a 58 y.o. female presenting today for follow up of  Chief Complaint  Patient presents with  . Follow-up    Laura [name] has a history of the following: Patient Active Problem List   Diagnosis Date Noted  . Atypical chest pain 08/10/2018  . Bipolar disorder ([place]) 08/10/2018  . Headache 08/10/2018  . H/O atrial flutter 08/10/2018  . OSA on CPAP 08/10/2018  . Anxiety 08/10/2018  . Uncontrolled type 2 diabetes mellitus with hyperglycemia ([place]) 03/13/2018  . Hypothyroidism 03/13/2018  . Essential hypertension, benign 03/13/2018  . Mixed hyperlipidemia 03/13/2018  . Hypogammaglobulinemia ([place]) 02/06/2018  . Non-allergic rhinitis 02/06/2018  . Severe persistent asthma, uncomplicated 63/84/6659  . Pulmonary nodules 02/06/2018  . Bronchiectasis without complication ([place]) 93/57/0177  . DM type 2 causing vascular disease ([place]) 02/06/2018    History obtained from: chart review and patient.  Laura [name] is a 58 y.o. female presenting for a follow up visit.  Asthma/Respiratory Symptom History: She reports that she has been using her rescue inhaler. She used it last a few days ago. This was just for one day. She was doing something with her sister at the mall and she had to sit down on the bench.  She otherwise has been fine from a respiratory perspective.  Her ACT score is 16, indicating poor asthma control but this is indicative of her recent use of  her rescue inhaler when she was at the mall.  She remains on the [facility] and the Spiriva. She is interested in getting off of some inhalers.  She has not seen [name] for a few months.  She is worried about going into his office and she knows that he has been taking care of COVID patients at the [place].  Allergic Rhinitis Symptom  History: She remains on the the saltwater rinses. She has not needed any antibiotics.  She is not use any kind of medicated spray.  She denies any rhinitis symptoms.  Infectious Symptom History: She has had no infections since last visit.  She remains on the same dose of Cuvitru.  She receives infusions at home without any adverse event.  We did do labs at the last visit and they were all normal, aside from slightly elevated glucose.  She does have a history of type 2 diabetes and sees Dr. Dorris [name] for this.   She sees Cardiology due to a history of ASD s/p repair when she was younger. She now has atrial fibrillation from these repairs. She has been more physically active as of late. She has tried using her treadmill at the house but she misses the Laura [name]. She has been doing outside classes.   Otherwise, there have been no changes to her past medical history, surgical history, family history, or social history. She is going to [place] today to go see her grandon who is turning ten.  She has 5 grandchildren in total.    Review of Systems  Constitutional: Negative.  Negative for fever, malaise/fatigue and weight loss.  HENT: Negative.  Negative for congestion, ear discharge, ear pain and sore throat.   Eyes: Negative for pain, discharge and redness.  Respiratory: Positive for shortness of breath. Negative for cough, sputum production and wheezing.   Cardiovascular: Negative.  Negative for chest pain and palpitations.  Gastrointestinal: Negative for abdominal pain, diarrhea, heartburn, nausea and vomiting.  Skin: Negative.  Negative for itching and rash.  Neurological: Negative for dizziness and headaches.  Endo/Heme/Allergies: Negative for environmental allergies. Does not bruise/bleed easily.       Objective:   Blood pressure 124/78, pulse 85, temperature 98.1 F (36.7 C), temperature source Temporal, resp. rate 18, SpO2 96 %. There is no height or weight on file to calculate BMI.    Physical Exam:  Physical Exam  Constitutional: She appears well-developed.  Pleasant female. Talkative. Tremors seem much better controlled today.   HENT:  Head: Normocephalic and atraumatic.  Right Ear: Tympanic membrane, external ear and ear canal normal.  Left Ear: Tympanic membrane and ear canal normal.  Nose: No mucosal edema, rhinorrhea, nasal deformity or septal deviation. No epistaxis. Right sinus exhibits no maxillary sinus tenderness and no frontal sinus tenderness. Left sinus exhibits no maxillary sinus tenderness and no frontal sinus tenderness.  Mouth/Throat: Uvula is midline and oropharynx is clear and moist. Mucous membranes are not pale and not dry.  Eyes: Pupils are equal, round, and reactive to light. Conjunctivae and EOM are normal. Right eye exhibits no chemosis and no discharge. Left eye exhibits no chemosis and no discharge. Right conjunctiva is not injected. Left conjunctiva is not injected.  Cardiovascular: Normal rate, regular rhythm and normal heart sounds.  Respiratory: Effort normal and breath sounds normal. No accessory muscle usage. No tachypnea. No respiratory distress. She has no wheezes. She has no rhonchi. She has no rales. She exhibits no tenderness.  Moving air well in all lung  fields.  Lymphadenopathy:    She has no cervical adenopathy.  Neurological: She is alert.  Skin: No abrasion, no petechiae and no rash noted. Rash is not papular, not vesicular and not urticarial. No erythema. No pallor.  Psychiatric: She has a normal mood and affect.     Diagnostic studies: none     Salvatore Marvel, MD  [facility]

## 2019-03-22 NOTE — Patient Instructions (Addendum)
1. Hypogammaglobulinemia - Continue with Cuvitru at the same dose. - Your latest IgG level was normal and you have not had an infection in quite some time. - I think we are headed in the right direction.    2. Chronic rhinitis   - Continue with nasal saline rinses as needed.   3. Severe persistent asthma, uncomplicated - We will give some time for the Berna Bue to work.  - You will get it monthly for one more dose and then every 8 weeks thereafter. - I am hopeful that this will help with your endurance and activity level.  - Daily controller medication(s): Dulera 200/26mcg two puffs twice daily with spacer and Spiriva 1.64mcg two puffs once daily and Fasenra monthly for one more dose and then every 8 weeks - Prior to physical activity: ProAir 2 puffs 10-15 minutes before physical activity. - Rescue medications: ProAir 4 puffs every 4-6 hours as needed, albuterol nebulizer one vial every 4-6 hours as needed or DuoNeb nebulizer one vial every 4-6 hours as needed - Asthma control goals:  * Full participation in all desired activities (may need albuterol before activity) * Albuterol use two time or less a week on average (not counting use with activity) * Cough interfering with sleep two time or less a month * Oral steroids no more than once a year * No hospitalizations.  4. Return in about 3 months (around 06/22/2019). This can be an in-person, a virtual Webex or a telephone follow up visit.   Please inform us of any Emergency Department visits, hospitalizations, or changes in symptoms. Call us before going to the ED for breathing or allergy symptoms since we might be able to fit you in for a sick visit. Feel free to contact us anytime with any questions, problems, or concerns.  It was a pleasure to see you again today! Have a safe trip to [place] today!   Websites that have reliable patient information: 1. American Academy of Asthma, Allergy, and Immunology: www.aaaai.org 2. Food  Allergy Research and Education (FARE): foodallergy.org 3. Mothers of Asthmatics: http://www.asthmacommunitynetwork.org 4. American College of Allergy, Asthma, and Immunology: www.acaai.org  "Like" [place] on Facebook and Instagram for our latest updates!      Make sure you are registered to vote! If you have moved or changed any of your contact information, you will need to get this updated before voting!    Voter ID laws are NOT going into effect for the General Election in November 2020! DO NOT let this stop you from exercising your right to vote!   Absentee voting is the SAFEST way to vote during the coronavirus pandemic! Download and print an absentee ballot request form at https://s3.amazonaws.com/dl.[email or web address].pdf  More information on absentee ballots can be found here: [email or web address].ncvoter.org/[email or web address]-[email or web address]

## 2019-03-29 ENCOUNTER — Ambulatory Visit (INDEPENDENT_AMBULATORY_CARE_PROVIDER_SITE_OTHER): Payer: Medicare Other | Admitting: Licensed Clinical Social Worker

## 2019-03-29 ENCOUNTER — Other Ambulatory Visit: Payer: Self-pay

## 2019-03-29 ENCOUNTER — Encounter (HOSPITAL_COMMUNITY): Payer: Self-pay | Admitting: Licensed Clinical Social Worker

## 2019-03-29 DIAGNOSIS — J4550 Severe persistent asthma, uncomplicated: Secondary | ICD-10-CM | POA: Diagnosis not present

## 2019-03-29 DIAGNOSIS — F4310 Post-traumatic stress disorder, unspecified: Secondary | ICD-10-CM | POA: Diagnosis not present

## 2019-03-29 DIAGNOSIS — Z6841 Body Mass Index (BMI) 40.0 and over, adult: Secondary | ICD-10-CM | POA: Diagnosis not present

## 2019-03-29 DIAGNOSIS — I5032 Chronic diastolic (congestive) heart failure: Secondary | ICD-10-CM | POA: Diagnosis not present

## 2019-03-29 DIAGNOSIS — F0630 Mood disorder due to known physiological condition, unspecified: Secondary | ICD-10-CM | POA: Diagnosis not present

## 2019-03-29 DIAGNOSIS — G250 Essential tremor: Secondary | ICD-10-CM | POA: Diagnosis not present

## 2019-03-29 DIAGNOSIS — D801 Nonfamilial hypogammaglobulinemia: Secondary | ICD-10-CM | POA: Diagnosis not present

## 2019-03-29 DIAGNOSIS — R944 Abnormal results of kidney function studies: Secondary | ICD-10-CM | POA: Diagnosis not present

## 2019-03-29 DIAGNOSIS — G259 Extrapyramidal and movement disorder, unspecified: Secondary | ICD-10-CM | POA: Diagnosis not present

## 2019-03-29 DIAGNOSIS — I482 Chronic atrial fibrillation, unspecified: Secondary | ICD-10-CM | POA: Diagnosis not present

## 2019-03-29 DIAGNOSIS — I10 Essential (primary) hypertension: Secondary | ICD-10-CM | POA: Diagnosis not present

## 2019-03-29 DIAGNOSIS — D803 Selective deficiency of immunoglobulin G [IgG] subclasses: Secondary | ICD-10-CM | POA: Diagnosis not present

## 2019-03-29 DIAGNOSIS — G9009 Other idiopathic peripheral autonomic neuropathy: Secondary | ICD-10-CM | POA: Diagnosis not present

## 2019-03-29 DIAGNOSIS — E782 Mixed hyperlipidemia: Secondary | ICD-10-CM | POA: Diagnosis not present

## 2019-03-29 DIAGNOSIS — F419 Anxiety disorder, unspecified: Secondary | ICD-10-CM | POA: Diagnosis not present

## 2019-03-29 DIAGNOSIS — E039 Hypothyroidism, unspecified: Secondary | ICD-10-CM | POA: Diagnosis not present

## 2019-03-29 DIAGNOSIS — E1142 Type 2 diabetes mellitus with diabetic polyneuropathy: Secondary | ICD-10-CM | POA: Diagnosis not present

## 2019-03-29 DIAGNOSIS — F319 Bipolar disorder, unspecified: Secondary | ICD-10-CM | POA: Diagnosis not present

## 2019-03-29 DIAGNOSIS — I509 Heart failure, unspecified: Secondary | ICD-10-CM | POA: Diagnosis not present

## 2019-03-29 DIAGNOSIS — E1140 Type 2 diabetes mellitus with diabetic neuropathy, unspecified: Secondary | ICD-10-CM | POA: Diagnosis not present

## 2019-03-29 DIAGNOSIS — J45909 Unspecified asthma, uncomplicated: Secondary | ICD-10-CM | POA: Diagnosis not present

## 2019-03-29 NOTE — Progress Notes (Signed)
Virtual Visit via Video Note  I connected with Laura [name] on 03/29/19 at 11:00 AM EDT by a video enabled telemedicine application and verified that I am speaking with the correct person using two identifiers.  I discussed the limitations of evaluation and management by telemedicine and the availability of in person appointments. The patient expressed understanding and agreed to proceed.   Participation Level: Active  Behavioral Response: CasualAlertDepressed  Type of Therapy: Individual Therapy  Treatment Goals addressed: Coping  Interventions: CBT and Solution Focused  Summary: Laura [name] is a 58 y.o. female who presents oriented x5 (person, place, situation, and time), casually dressed, appropriately groomed, average height, overweight, and cooperative to address mood. Patient has a history of medical treatment including hypothyroidism, hypertensions, and diabetes. Patient has a history of mental health treatment including outpatient therapy and medication management. Patient denies homicidal and suicidal ideations. Patient denies psychosis including auditory and visual hallucinations. Patient denies substance abuse. Patient is at low risk for lethality at this time.  Physically: Patient weighed herself and recognized she didn't gain weight as she had thought. Patient continues to work on her eating habits. Patient is urinating a lot. It is disrupting her sleep. Patient's kidney's are not functioning well. Kidney disease runs in her family and she is concerned. Patient has been more active and getting outside more.  Spiritually/values: No issues identified.  Relationships: Patient is getting along with her family. Patient continues to feel lonely and longs for a relationship.  Emotionally/Mentally/Behavior:  Patient's mood has improved. She is feeling ok. Patient noted she has guilt over her husbands death due to not doing more for him. After discussion, patient recognized she was  acting in "good faith" and doing what she could to help her husband but husband was misdiagnosed by the hospital and her husband withheld information related to his diagnosis from her.   Suicidal/Homicidal: Negativewithout intent/plan  Therapist Response: Therapist reviewed patient's recent thoughts and behaviors. Therapist utilized CBT to address mood. Therapist processed patient's feelings to identify triggers for mood. Therapist discussed with patient her guilt over her husband's passing and her physical health.   Plan: Return again in 2-3 weeks.  Diagnosis: Axis I: Post Traumatic Stress Disorder    Axis II: No diagnosis  I discussed the assessment and treatment plan with the patient. The patient was provided an opportunity to ask questions and all were answered. The patient agreed with the plan and demonstrated an understanding of the instructions.   The patient was advised to call back or seek an in-person evaluation if the symptoms worsen or if the condition fails to improve as anticipated.  I provided 40 minutes of non-face-to-face time during this encounter.   Glori Bickers, LCSW 03/29/2019
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
## 2019-04-11 DIAGNOSIS — R944 Abnormal results of kidney function studies: Secondary | ICD-10-CM | POA: Diagnosis not present

## 2019-04-16 ENCOUNTER — Other Ambulatory Visit: Payer: Self-pay

## 2019-04-16 ENCOUNTER — Ambulatory Visit (INDEPENDENT_AMBULATORY_CARE_PROVIDER_SITE_OTHER): Payer: Medicare Other | Admitting: Psychiatry

## 2019-04-16 ENCOUNTER — Encounter (HOSPITAL_COMMUNITY): Payer: Self-pay | Admitting: Psychiatry

## 2019-04-16 DIAGNOSIS — F4310 Post-traumatic stress disorder, unspecified: Secondary | ICD-10-CM

## 2019-04-16 DIAGNOSIS — F0630 Mood disorder due to known physiological condition, unspecified: Secondary | ICD-10-CM | POA: Diagnosis not present

## 2019-04-16 MED ORDER — LAMOTRIGINE 25 MG PO TABS: 25.0000 mg | ORAL_TABLET | Freq: Every day | ORAL | 0 refills | Status: DC

## 2019-04-16 MED ORDER — DIVALPROEX SODIUM ER 500 MG PO TB24: 1500.0000 mg | ORAL_TABLET | Freq: Two times a day (BID) | ORAL | 0 refills | Status: DC

## 2019-04-16 NOTE — Patient Instructions (Signed)
1.ContinueDepakote1500 mg twice a day  2.Continue lamotrigine 25mg  daily  3.Next appointment: 9/10 at 9 AM

## 2019-04-19 ENCOUNTER — Other Ambulatory Visit (HOSPITAL_COMMUNITY): Payer: Self-pay

## 2019-04-19 ENCOUNTER — Ambulatory Visit: Payer: Self-pay

## 2019-04-23 ENCOUNTER — Ambulatory Visit: Payer: Self-pay | Admitting: Neurology

## 2019-04-26 ENCOUNTER — Ambulatory Visit (HOSPITAL_COMMUNITY): Payer: Medicare Other | Admitting: Hematology

## 2019-04-26 ENCOUNTER — Ambulatory Visit (HOSPITAL_COMMUNITY): Payer: Self-pay | Admitting: Hematology

## 2019-05-02 ENCOUNTER — Other Ambulatory Visit (HOSPITAL_COMMUNITY): Payer: Self-pay | Admitting: Internal Medicine

## 2019-05-02 DIAGNOSIS — Z1231 Encounter for screening mammogram for malignant neoplasm of breast: Secondary | ICD-10-CM

## 2019-05-02 DIAGNOSIS — Z780 Asymptomatic menopausal state: Secondary | ICD-10-CM

## 2019-05-03 ENCOUNTER — Encounter (HOSPITAL_COMMUNITY): Payer: Self-pay | Admitting: Licensed Clinical Social Worker

## 2019-05-03 ENCOUNTER — Ambulatory Visit (INDEPENDENT_AMBULATORY_CARE_PROVIDER_SITE_OTHER): Payer: Medicare Other | Admitting: Licensed Clinical Social Worker

## 2019-05-03 ENCOUNTER — Other Ambulatory Visit: Payer: Self-pay

## 2019-05-03 DIAGNOSIS — F4310 Post-traumatic stress disorder, unspecified: Secondary | ICD-10-CM | POA: Diagnosis not present

## 2019-05-03 NOTE — Progress Notes (Signed)
Virtual Visit via Video Note  I connected with [name] on 05/03/19 at 10:00 AM EDT by a video enabled telemedicine application and verified that I am speaking with the correct person using two identifiers.  I discussed the limitations of evaluation and management by telemedicine and the availability of in person appointments. The patient expressed understanding and agreed to proceed.   Participation Level: Active  Behavioral Response: CasualAlertDepressed  Type of Therapy: Individual Therapy  Treatment Goals addressed: Coping  Interventions: CBT and Solution Focused  Summary: Laura [name] is a 58 y.o. female who presents oriented x5 (person, place, situation, and time), casually dressed, appropriately groomed, average height, overweight, and cooperative to address mood. Patient has a history of medical treatment including hypothyroidism, hypertensions, and diabetes. Patient has a history of mental health treatment including outpatient therapy and medication management. Patient denies homicidal and suicidal ideations. Patient denies psychosis including auditory and visual hallucinations. Patient denies substance abuse. Patient is at low risk for lethality at this time.  Physically: Patient has been focusing on her health. She is watching what she eats and trying to stay active including walking, going fishing, etc. She is struggling with eating sweets at night. Patient is trying to stop giving in to sweets. She is going to try to switch out an "unhealthy" sweet for a healthier option but still indulge.  Spiritually/values: No issues identified.  Relationships: Patient is getting along with her family. Patient is still experiencing loneliness. Patient talks to people online but feels like they all ask her for money. Patient wants to be able to get some platonic relationships but doesn't have the options to do so right now. Patient has planned a trip to see her daughter to help her  daughter with wedding dress shopping.  Emotionally/Mentally/Behavior:  Patient's mood is ok. She is focusing on her physical health.    Suicidal/Homicidal: Negativewithout intent/plan  Therapist Response: Therapist reviewed patient's recent thoughts and behaviors. Therapist utilized CBT to address mood. Therapist processed patient's feelings to identify triggers for mood. Therapist discussed with patient physical health and steps she is taking to improve mood.   Plan: Return again in 2-3 weeks.  Diagnosis: Axis I: Post Traumatic Stress Disorder    Axis II: No diagnosis  I discussed the assessment and treatment plan with the patient. The patient was provided an opportunity to ask questions and all were answered. The patient agreed with the plan and demonstrated an understanding of the instructions.   The patient was advised to call back or seek an in-person evaluation if the symptoms worsen or if the condition fails to improve as anticipated.  I provided 40 minutes of non-face-to-face time during this encounter.   Glori Bickers, LCSW 05/03/2019

## 2019-05-14 ENCOUNTER — Encounter: Payer: Self-pay | Admitting: Neurology

## 2019-05-14 ENCOUNTER — Encounter (HOSPITAL_COMMUNITY): Payer: Self-pay

## 2019-05-17 ENCOUNTER — Other Ambulatory Visit (HOSPITAL_COMMUNITY): Payer: Medicare Other

## 2019-05-17 ENCOUNTER — Ambulatory Visit (HOSPITAL_COMMUNITY): Payer: Medicare Other

## 2019-06-14 ENCOUNTER — Ambulatory Visit (HOSPITAL_COMMUNITY)
Admission: RE | Admit: 2019-06-14 | Discharge: 2019-06-14 | Disposition: A | Discharge status: Home / Self Care | Payer: Medicare Other | Source: Ambulatory Visit | Attending: Internal Medicine | Admitting: Internal Medicine

## 2019-06-14 ENCOUNTER — Other Ambulatory Visit: Payer: Self-pay

## 2019-06-14 ENCOUNTER — Other Ambulatory Visit (HOSPITAL_COMMUNITY): Payer: Medicare Other

## 2019-06-14 DIAGNOSIS — Z1231 Encounter for screening mammogram for malignant neoplasm of breast: Secondary | ICD-10-CM | POA: Insufficient documentation

## 2019-06-14 DIAGNOSIS — Z13820 Encounter for screening for osteoporosis: Secondary | ICD-10-CM | POA: Diagnosis not present

## 2019-06-14 DIAGNOSIS — Z780 Asymptomatic menopausal state: Secondary | ICD-10-CM | POA: Insufficient documentation

## 2019-06-14 IMAGING — MG MM DIGITAL SCREENING BILAT W/ TOMO W/ CAD
8 of 14 series · 8 of 40 positions shown · non-contrast
Comparison: Previous exam(s).

ACR Breast Density Category a: The breast tissue is almost entirely
fatty.

CLINICAL DATA: Screening.

EXAM:
DIGITAL SCREENING BILATERAL MAMMOGRAM WITH TOMO AND CAD

[R MLO synth-2D (1 of 2)]
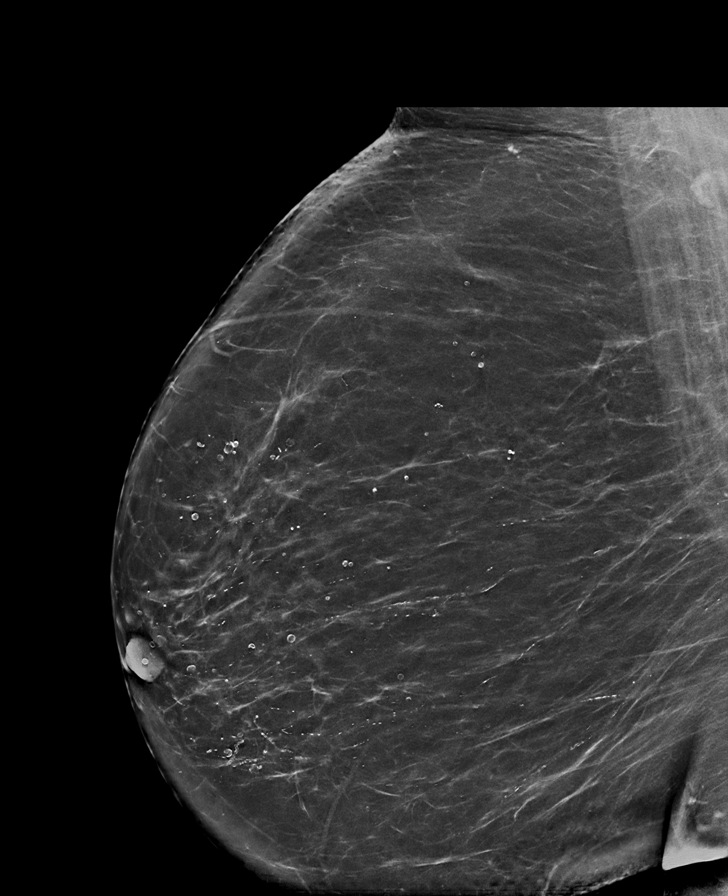

[R MLO synth-2D (2 of 2)]
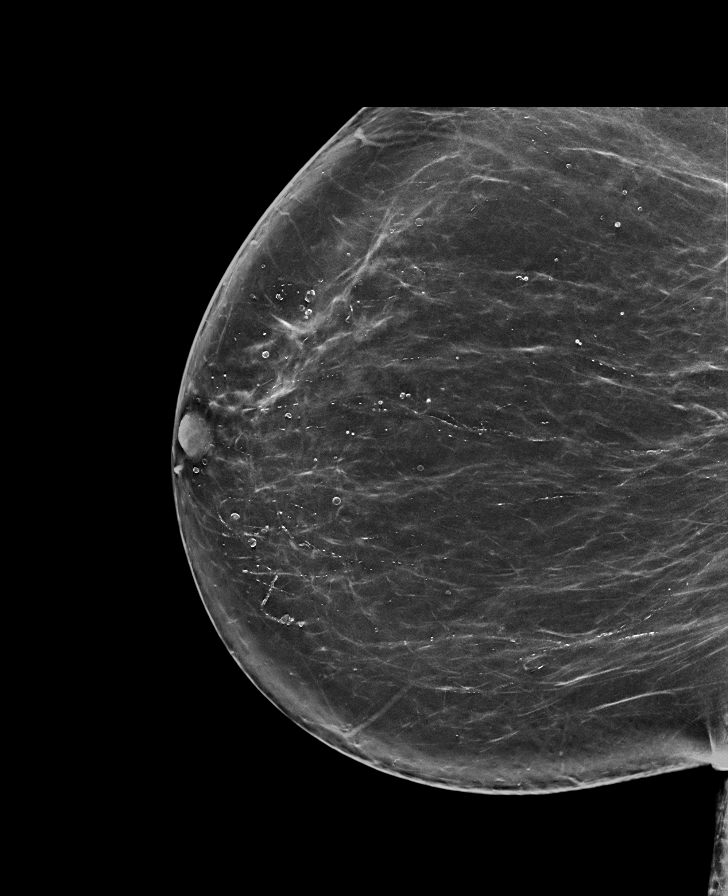

[L MLO synth-2D (1 of 2)]
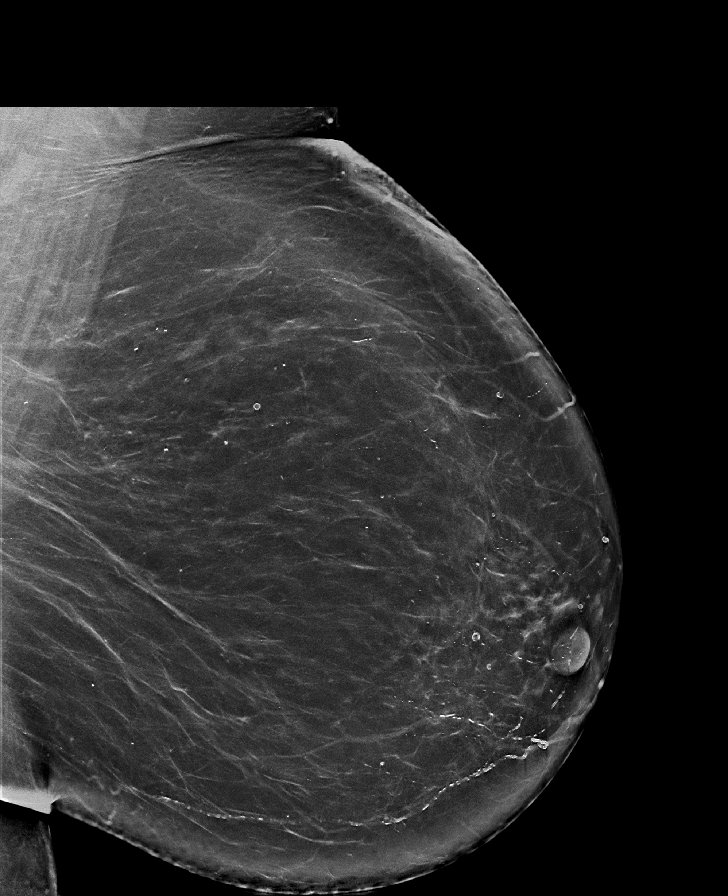

[R CV synth-2D]
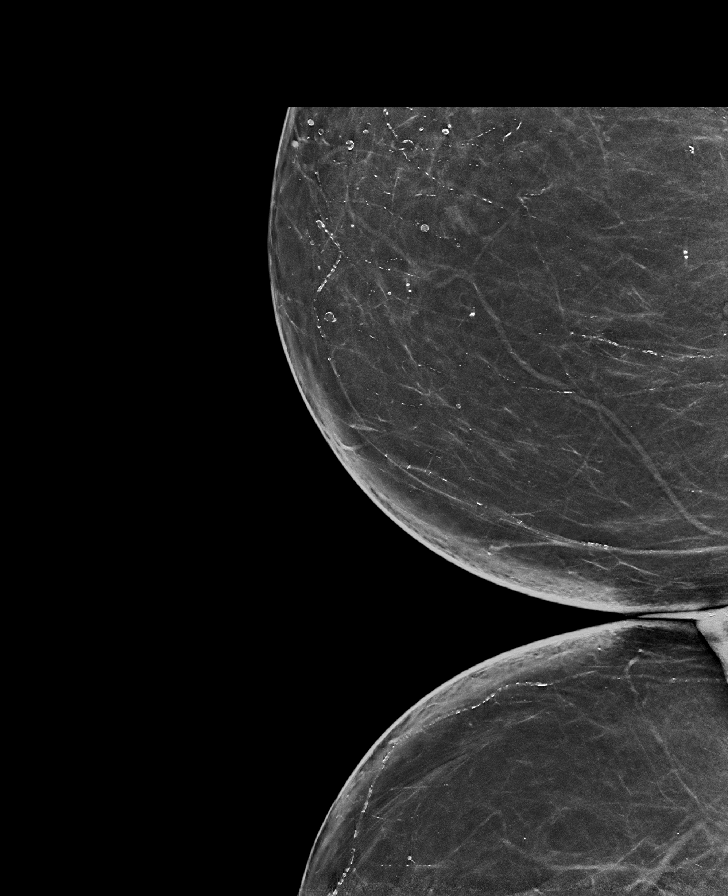

[R CC synth-2D]
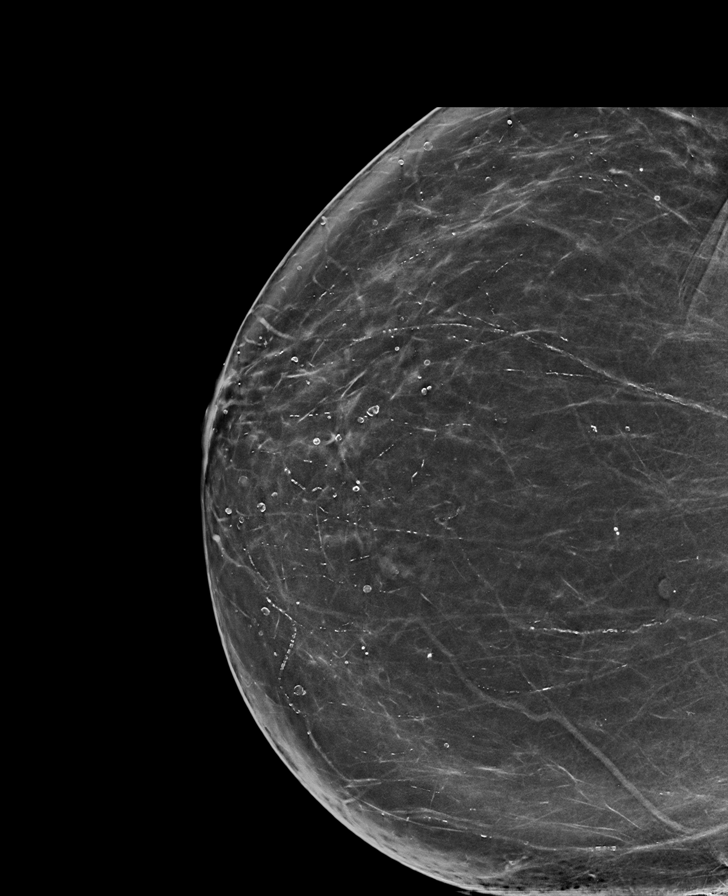

[L MLO synth-2D (2 of 2)]
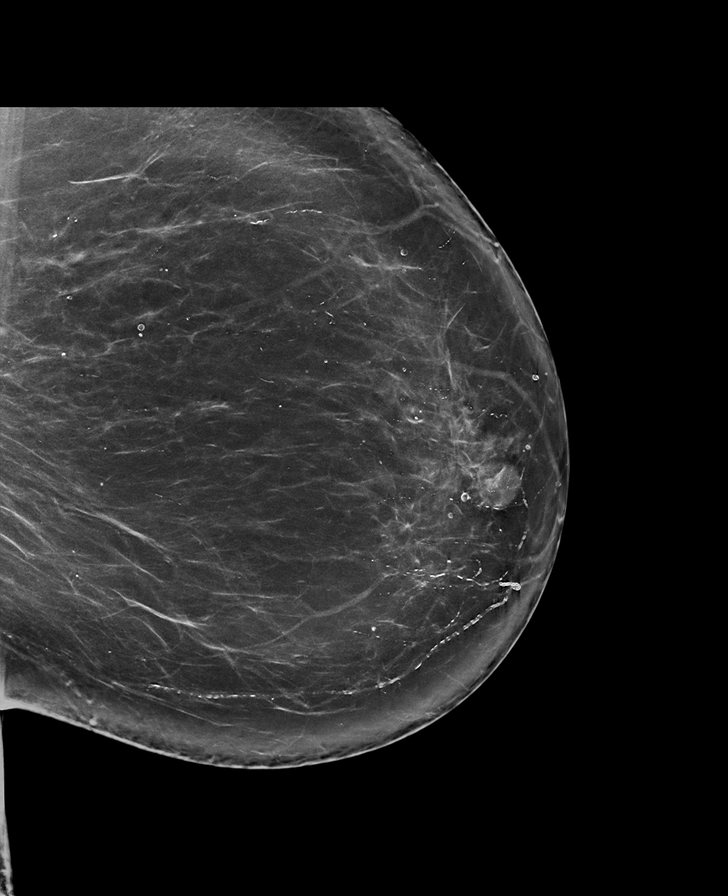

[L CC synth-2D]
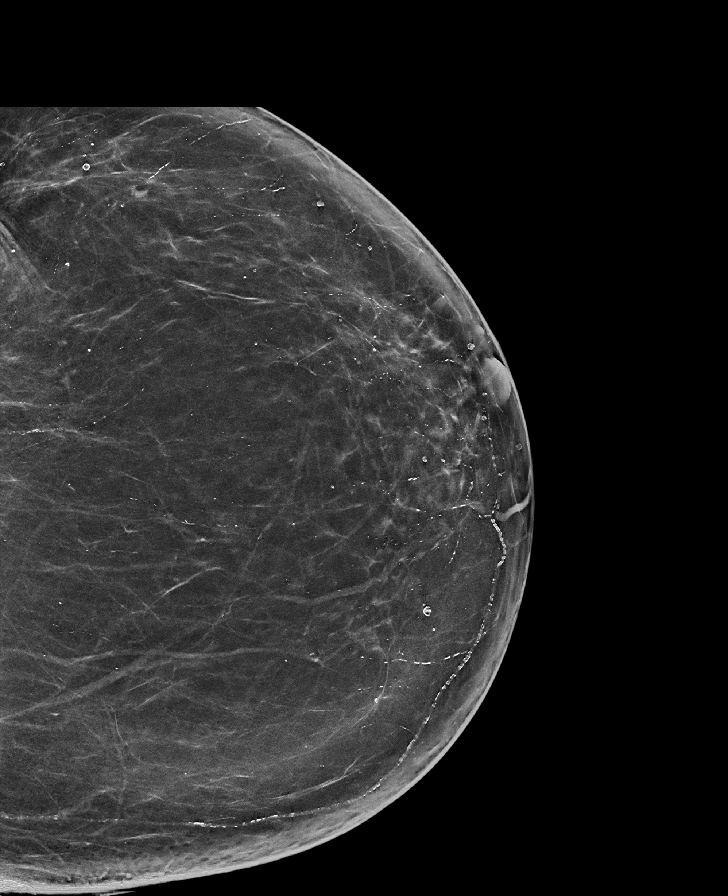

[R MLO tomo · tomo slice 51/100.0]
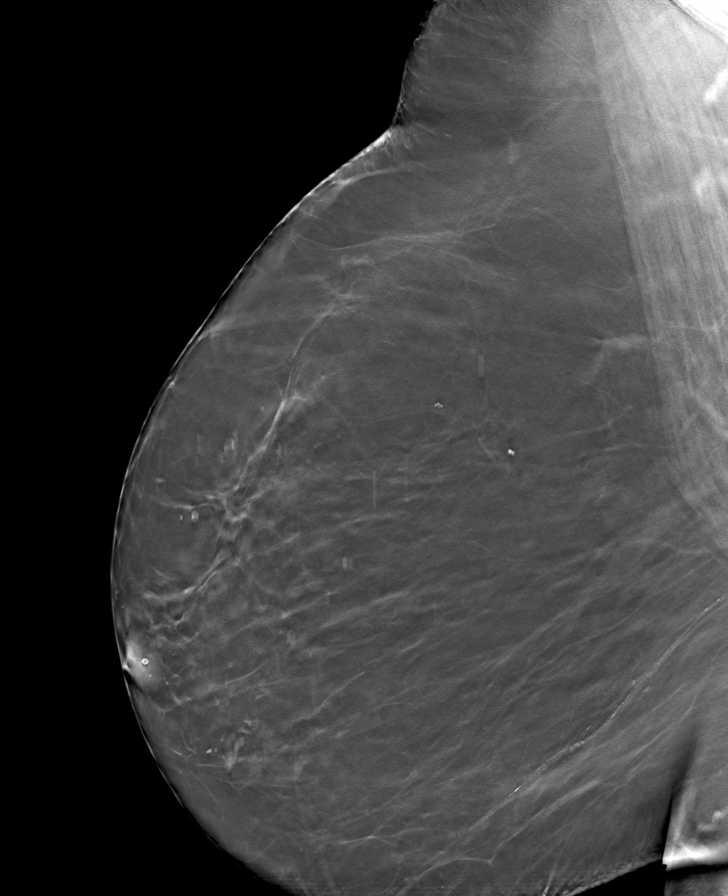

[8 of 40 positions shown; findings below may reference images not displayed]

FINDINGS: There are no findings suspicious for malignancy. Images were
processed with CAD.
IMPRESSION: No mammographic evidence of malignancy. A result letter of this
screening mammogram will be mailed directly to the patient.

RECOMMENDATION:
Screening mammogram in one year. (Code:[TA])

BI-RADS CATEGORY  1: Negative.

## 2019-06-20 ENCOUNTER — Ambulatory Visit (HOSPITAL_COMMUNITY): Payer: Medicare Other | Admitting: Psychiatry

## 2019-06-21 ENCOUNTER — Other Ambulatory Visit: Payer: Self-pay

## 2019-06-21 ENCOUNTER — Ambulatory Visit (INDEPENDENT_AMBULATORY_CARE_PROVIDER_SITE_OTHER): Payer: Medicare Other | Admitting: Allergy & Immunology

## 2019-06-21 ENCOUNTER — Encounter: Payer: Self-pay | Admitting: Allergy & Immunology

## 2019-06-21 VITALS — BP 120/70 | HR 88 | Temp 98.3°F | Resp 18

## 2019-06-21 DIAGNOSIS — D839 Common variable immunodeficiency, unspecified: Secondary | ICD-10-CM

## 2019-06-21 DIAGNOSIS — F319 Bipolar disorder, unspecified: Secondary | ICD-10-CM | POA: Diagnosis not present

## 2019-06-21 DIAGNOSIS — E1140 Type 2 diabetes mellitus with diabetic neuropathy, unspecified: Secondary | ICD-10-CM | POA: Diagnosis not present

## 2019-06-21 DIAGNOSIS — G9009 Other idiopathic peripheral autonomic neuropathy: Secondary | ICD-10-CM | POA: Diagnosis not present

## 2019-06-21 DIAGNOSIS — I10 Essential (primary) hypertension: Secondary | ICD-10-CM | POA: Diagnosis not present

## 2019-06-21 DIAGNOSIS — I509 Heart failure, unspecified: Secondary | ICD-10-CM | POA: Diagnosis not present

## 2019-06-21 DIAGNOSIS — F419 Anxiety disorder, unspecified: Secondary | ICD-10-CM | POA: Diagnosis not present

## 2019-06-21 DIAGNOSIS — J4550 Severe persistent asthma, uncomplicated: Secondary | ICD-10-CM | POA: Diagnosis not present

## 2019-06-21 DIAGNOSIS — J45909 Unspecified asthma, uncomplicated: Secondary | ICD-10-CM | POA: Diagnosis not present

## 2019-06-21 DIAGNOSIS — D803 Selective deficiency of immunoglobulin G [IgG] subclasses: Secondary | ICD-10-CM | POA: Diagnosis not present

## 2019-06-21 DIAGNOSIS — Z6841 Body Mass Index (BMI) 40.0 and over, adult: Secondary | ICD-10-CM | POA: Diagnosis not present

## 2019-06-21 DIAGNOSIS — J310 Chronic rhinitis: Secondary | ICD-10-CM | POA: Diagnosis not present

## 2019-06-21 DIAGNOSIS — G259 Extrapyramidal and movement disorder, unspecified: Secondary | ICD-10-CM | POA: Diagnosis not present

## 2019-06-21 DIAGNOSIS — R944 Abnormal results of kidney function studies: Secondary | ICD-10-CM | POA: Diagnosis not present

## 2019-06-21 MED ORDER — MOMETASONE FURO-FORMOTEROL FUM 200-5 MCG/ACT IN AERO: 2.0000 | INHALATION_SPRAY | Freq: Two times a day (BID) | RESPIRATORY_TRACT | 5 refills | Status: DC

## 2019-06-21 MED ORDER — ALBUTEROL SULFATE HFA 108 (90 BASE) MCG/ACT IN AERS: 2.0000 | INHALATION_SPRAY | Freq: Four times a day (QID) | RESPIRATORY_TRACT | 1 refills | Status: DC | PRN

## 2019-06-21 NOTE — Patient Instructions (Addendum)
1. Hypogammaglobulinemia - Continue with Cuvitru at the same dose. - Your latest IgG level was normal and you have not had an infection in quite some time. - I think we are headed in the right direction.    2. Chronic rhinitis   - Continue with nasal saline rinses as needed.   3. Severe persistent asthma, uncomplicated - Lung function looked stable. - I agree with you, I think a lot of these breathing problems have more to do with the weight gain and other issues. - Let's give the Berna Bue another change to work. - Call Manuela Schwartz at [facility] at [phone] to get the delivery set up again.  - Daily controller medication(s): Dulera 200/29mcg two puffs twice daily with spacer and Spiriva 1.66mcg two puffs once daily and Fasenra monthly for one more dose and then every 8 weeks - Prior to physical activity: ProAir 2 puffs 10-15 minutes before physical activity. - Rescue medications: ProAir 4 puffs every 4-6 hours as needed, albuterol nebulizer one vial every 4-6 hours as needed or DuoNeb nebulizer one vial every 4-6 hours as needed - Asthma control goals:  * Full participation in all desired activities (may need albuterol before activity) * Albuterol use two time or less a week on average (not counting use with activity) * Cough interfering with sleep two time or less a month * Oral steroids no more than once a year * No hospitalizations.  4. Return in about 3 months (around 09/20/2019). This can be an in-person, a virtual Webex or a telephone follow up visit.   Please inform us of any Emergency Department visits, hospitalizations, or changes in symptoms. Call us before going to the ED for breathing or allergy symptoms since we might be able to fit you in for a sick visit. Feel free to contact us anytime with any questions, problems, or concerns.  It was a pleasure to see you again today!  Websites that have reliable patient information: 1. American Academy of Asthma, Allergy, and Immunology:  www.aaaai.org 2. Food Allergy Research and Education (FARE): foodallergy.org 3. Mothers of Asthmatics: http://www.asthmacommunitynetwork.org 4. American College of Allergy, Asthma, and Immunology: www.acaai.org  "Like" [place] on Facebook and Instagram for our latest updates!      Make sure you are registered to vote! If you have moved or changed any of your contact information, you will need to get this updated before voting!  In some cases, you MAY be able to register to vote online: [email or web address]    Voter ID laws are NOT going into effect for the General Election in November 2020! DO NOT let this stop you from exercising your right to vote!   Absentee voting is the SAFEST way to vote during the coronavirus pandemic!   Download and print an absentee ballot request form at [email or web address] or you can scan the QR code below with your smart phone:      More information on absentee ballots can be found here: [email or web address]

## 2019-06-21 NOTE — Progress Notes (Signed)
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 Hypogammaglobulinemia - Continue with Cuvitru at the same dose. - Your latest IgG level was normal and you have not had an infection in quite some time. - I think we are headed in the right direction.    2. Chronic rhinitis   - Continue with nasal saline rinses as needed.   3. Severe persistent asthma, uncomplicated - Lung function looked stable. - I agree with you, I think a lot of these breathing problems have more to do with the weight gain and other issues. - Let's give the Berna Bue another change to work. - She is open to this idea.  - Call Manuela Schwartz at [facility] at [phone] to get the delivery set up again.  - Daily controller medication(s): Dulera 200/28mcg two puffs twice daily with spacer and Spiriva 1.1mcg two puffs once daily and Fasenra monthly for one more dose and then every 8 weeks - Prior to physical activity: ProAir 2 puffs 10-15 minutes before physical activity. - Rescue medications: ProAir 4 puffs every 4-6 hours as needed, albuterol nebulizer one vial every 4-6 hours as needed or DuoNeb nebulizer one vial every 4-6 hours as needed - Asthma control goals:  * Full participation in all desired activities (may need albuterol before activity) * Albuterol use two time or less a week on average (not counting use  with activity) * Cough interfering with sleep two time or less a month * Oral steroids no more than once a year * No hospitalizations.  4. Return in about 3 months (around 09/20/2019). This can be an in-person, a virtual Webex or a telephone follow up visit.   Subjective:   Laura [name] is a 58 y.o. female presenting today for follow up of  Chief Complaint  Patient presents with  . Follow-up    Laura [name] has a history of the following: Patient Active Problem List   Diagnosis Date Noted  . Atypical chest pain 08/10/2018  . Bipolar disorder ([place]) 08/10/2018  . Headache 08/10/2018  . H/O atrial flutter 08/10/2018  . OSA on CPAP 08/10/2018  . Anxiety 08/10/2018  . Uncontrolled type 2 diabetes mellitus with hyperglycemia ([place]) 03/13/2018  . Hypothyroidism 03/13/2018  . Essential hypertension, benign 03/13/2018  . Mixed hyperlipidemia 03/13/2018  . Hypogammaglobulinemia ([place]) 02/06/2018  . Non-allergic rhinitis 02/06/2018  . Severe persistent asthma, uncomplicated [id number]  . Pulmonary nodules 02/06/2018  . Bronchiectasis without complication ([place]) [id number]  . DM type 2 causing vascular disease ([place]) 02/06/2018    History obtained from: chart review and patient.  Laura [name] is a 58 y.o. female presenting for a follow up visit.  She was last seen in June 2020.  At that time, she remained on Cuvitru and we kept the dose the same.  Her IgG level was normal.  We continue with nasal saline rinses as needed.  She was having a slowly progressively worsening course for asthma, so we made the decision to add on Fasenra to her Dulera 200/5 mcg  and Spiriva 1.25 mcg.  Since the last visit, she has mostly done well. She stopped the [place] and did not feel that it helped. She only received two doses. In addition, she did not want to try to find her way down to [facility] when we were not in [place] for a few weeks.     Asthma/Respiratory Symptom History: She does report some asthma  symptoms. She did put on weight again which she thinks might be contributing to her sense of shortness of breath. She also has a history of CHF adn between the two, she thinks this is what is contributing to her breathing problems. She is trying to make herself walk more and eat better. She is no longer able to work out at [employer] because of the [place] pandemic. She knows that she needs to "get [her] butt into action".   Non-Allergic Rhinitis Symptom History: She remains on nasal saline rinses as needed. She has not needed any antibiotics at all for sinus infections. She has been taking a lot of steps to keep herself away from others who are sick.  Infection Symptom History: She remains on her immunoglobulin replacement. She is having no breakthrough infections at all. She is having no problems at all with getting the drug delivered.   Her last chest CT was in March 2020. At that time, it actually looked fairly good without evidence of interstitial lung disease and nonspecific diffuse bronchial wall thickening consistent with reactive airway disease.   She has not seen [name] since the pandemic began. Overall she feels that she has been doing well on the current regimen. However, then in the next sentence, she shares that she is having some chest tightness that is relieved, if only transiently, with albuterol. She has not had any episodes of bronchitis and she does not think that she has even needed antibiotics since moving to [place].   Otherwise, there have been no changes to her past medical history, surgical history, family history, or social history.    Review of Systems  Constitutional: Negative.  Negative for chills, fever, malaise/fatigue and weight loss.  HENT: Negative.  Negative for congestion, ear discharge, ear pain and sore throat.   Eyes: Negative for pain, discharge and redness.  Respiratory: Positive for shortness of breath. Negative for cough, hemoptysis, sputum  production, wheezing and stridor.   Cardiovascular: Negative.  Negative for chest pain and palpitations.  Gastrointestinal: Negative for abdominal pain, constipation, diarrhea, heartburn, nausea and vomiting.  Skin: Negative.  Negative for itching and rash.  Neurological: Negative for dizziness and headaches.  Endo/Heme/Allergies: Negative for environmental allergies. Does not bruise/bleed easily.       Objective:   Blood pressure 120/70, pulse 88, temperature 98.3 F (36.8 C), temperature source Temporal, resp. rate 18, SpO2 95 %. There is no height or weight on file to calculate BMI.   Physical Exam:  Physical Exam  Constitutional: She appears well-developed.  Pleasant smiling female.  HENT:  Head: Normocephalic and atraumatic.  Right Ear: Tympanic membrane, external ear and ear canal normal.  Left Ear: Tympanic membrane, external ear and ear canal normal.  Nose: Mucosal edema and rhinorrhea present. No nasal deformity or septal deviation. No epistaxis. Right sinus exhibits no maxillary sinus tenderness and no frontal sinus tenderness. Left sinus exhibits no maxillary sinus tenderness and no frontal sinus tenderness.  Mouth/Throat: Uvula is midline and oropharynx is clear and moist. Mucous membranes are not pale and not dry.  Eyes: Pupils  are equal, round, and reactive to light. Conjunctivae and EOM are normal. Right eye exhibits no chemosis and no discharge. Left eye exhibits no chemosis and no discharge. Right conjunctiva is not injected. Left conjunctiva is not injected.  Cardiovascular: Normal rate, regular rhythm and normal heart sounds.  Respiratory: Effort normal and breath sounds normal. No accessory muscle usage. No tachypnea. No respiratory distress. She has no wheezes. She has no rhonchi. She has no rales. She exhibits no tenderness.  Moving air well in all lung fields.   Lymphadenopathy:    She has no cervical adenopathy.  Neurological: She is alert.  Skin: No  abrasion, no petechiae and no rash noted. Rash is not papular, not vesicular and not urticarial. No erythema. No pallor.  No eczematous or urticarial lesions noted. She does have very tanned skin.   Psychiatric: She has a normal mood and affect.     Diagnostic studies:    Spirometry: results abnormal (FEV1: 1.50/64%, FVC: 2.20/70%, FEV1/FVC: 68%).    Spirometry consistent with mixed obstructive and restrictive disease.   Allergy Studies: none       Salvatore Marvel, MD  [facility]

## 2019-06-24 ENCOUNTER — Ambulatory Visit (HOSPITAL_COMMUNITY): Payer: Medicare Other | Admitting: Psychiatry

## 2019-06-25 ENCOUNTER — Telehealth (HOSPITAL_COMMUNITY): Payer: Self-pay

## 2019-06-25 ENCOUNTER — Telehealth (HOSPITAL_COMMUNITY): Payer: Self-pay | Admitting: Psychiatry

## 2019-06-25 ENCOUNTER — Telehealth: Payer: Self-pay | Admitting: Cardiovascular Disease

## 2019-06-25 ENCOUNTER — Other Ambulatory Visit (HOSPITAL_COMMUNITY): Payer: Self-pay

## 2019-06-25 DIAGNOSIS — F3131 Bipolar disorder, current episode depressed, mild: Secondary | ICD-10-CM

## 2019-06-25 DIAGNOSIS — F0630 Mood disorder due to known physiological condition, unspecified: Secondary | ICD-10-CM

## 2019-06-25 NOTE — Telephone Encounter (Signed)

## 2019-06-25 NOTE — Telephone Encounter (Signed)
Reviewed blood test result obtained by her PCP. Valproic acid level was slightly higher than expected range. Could you advise the patient for the following:  -  Reduce Depakote 1000 mg in AM, 1500 mg at night -  Advise her to obtain another blood test (valproic acid level. Total is fine), five days after she reduces the dose of Depakote. She may get it by PCP, or I can send orders if needed.   ---------------------------- Obtained blood test result at [facility], ordered by Dr. Wende Neighbors.  Free VPA 29.9 on 9/11

## 2019-06-25 NOTE — Telephone Encounter (Signed)
Medication management - Telephone call with pt who stated understanding new instructions to cut her Depakote ER 500mg  back to 2 in the morning and 3 in the evening.  Pt. agreed to have labs redrawn 5 days after change and requested this order be put in for [facility] collection so it will be there when she goes to have it completed.  Patient reported no other concerns or questions and agreed to call back if any problems getting lab work completed after 5 days at new medication dosage per Dr. Modesta Messing.

## 2019-06-25 NOTE — Telephone Encounter (Signed)
VPA ordered to [facility]

## 2019-06-28 ENCOUNTER — Encounter: Payer: Self-pay | Admitting: Cardiovascular Disease

## 2019-06-28 ENCOUNTER — Other Ambulatory Visit: Payer: Self-pay

## 2019-06-28 ENCOUNTER — Telehealth (INDEPENDENT_AMBULATORY_CARE_PROVIDER_SITE_OTHER): Payer: Medicare Other | Admitting: Cardiovascular Disease

## 2019-06-28 VITALS — Ht 64.0 in | Wt 253.0 lb

## 2019-06-28 DIAGNOSIS — Z8774 Personal history of (corrected) congenital malformations of heart and circulatory system: Secondary | ICD-10-CM

## 2019-06-28 DIAGNOSIS — I4892 Unspecified atrial flutter: Secondary | ICD-10-CM

## 2019-06-28 DIAGNOSIS — I480 Paroxysmal atrial fibrillation: Secondary | ICD-10-CM

## 2019-06-28 DIAGNOSIS — D839 Common variable immunodeficiency, unspecified: Secondary | ICD-10-CM

## 2019-06-28 DIAGNOSIS — IMO0001 Reserved for inherently not codable concepts without codable children: Secondary | ICD-10-CM

## 2019-06-28 DIAGNOSIS — I10 Essential (primary) hypertension: Secondary | ICD-10-CM

## 2019-06-28 DIAGNOSIS — G4733 Obstructive sleep apnea (adult) (pediatric): Secondary | ICD-10-CM

## 2019-06-28 DIAGNOSIS — Z7901 Long term (current) use of anticoagulants: Secondary | ICD-10-CM

## 2019-06-28 NOTE — Progress Notes (Signed)
Virtual Visit via Telephone Note   This visit type was conducted due to national recommendations for restrictions regarding the COVID-19 Pandemic (e.g. social distancing) in an effort to limit this patient's exposure and mitigate transmission in our community.  Due to her co-morbid illnesses, this patient is at least at moderate risk for complications without adequate follow up.  This format is felt to be most appropriate for this patient at this time.  The patient did not have access to video technology/had technical difficulties with video requiring transitioning to audio format only (telephone).  All issues noted in this document were discussed and addressed.  No physical exam could be performed with this format.  Please refer to the patient's chart for her  consent to telehealth for [facility].   Date:  06/28/2019   ID:  Laura [name], DOB 17-Dec-1960, MRN [id number]  Patient Location: Home Provider Location: Office  PCP:  [name], MD  Cardiologist:  Kate Sable, MD  Electrophysiologist:  None   Evaluation Performed:  Follow-Up Visit  Chief Complaint:  Paroxysmal atrial fibrillation and flutter  History of Present Illness:    Laura [name] is a 58 y.o. female with a history of paroxysmal atrial fibrillation and flutter.  In summary, past medical history is complex and includes common variable immune deficiency, insulin-dependent diabetes mellitus, bipolar type I disorder, TIA, obstructive sleep apnea, and severe persistent asthma and bronchiectasis.  She had ASD repair at age 86 in [place] at [place].  She was evaluated for chest pain at [facility] in early November 2019 which was felt to be atypical for a cardiac etiology.  Echocardiogram demonstrated vigorous left ventricular systolic function, LVEF 65 to 70%, and normal regional wall motion.  She had severe LVH and mild left atrial dilatation.  She underwent elective direct-current cardioversion on  08/29/2018 for atrial flutter.  She underwent a high-resolution chest CT on 12/21/2018 which I reviewed and demonstrated nonspecific diffuse bronchial wall thickening and aortic atherosclerosis.  She is currently visiting with her daughter in [place].  She denies chest pain and palpitations.  She has put on about 7 pounds and has become a little more short of breath due to that.  She is also had bilateral feet swelling corresponding with the weight gain.  She says she has been feeling well.  The patient does not have symptoms concerning for COVID-19 infection (fever, chills, cough).   Social history: She moved to [place] in 2019 from [place], [place], where she had lived with her daughter for 3 years. She is originally from [facility] [place]. She has a daughter who lives in [place], a daughter who lives in [place], [place], and a daughter who lives in [place]. She is currently living with her sister. She is widowed. She was married for 30 years. Her husband died of bilateral pulmonary embolism. He had lung cancer, a brain tumor and a history of non-Hodgkin's lymphoma.   Past Medical History:  Diagnosis Date  . Asthma   . Atrial fibrillation ([place])   . Bipolar disorder ([place])   . Bronchiectasis ([place])   . CHF (congestive heart failure) ([place])   . Complication of anesthesia    "my Dr in [place] told me I had an allergic reaction to the combination of the pain meds and the anesthesia" she does not remember what happened but "I eneded up in the ICU for 5 days".  . Hypogammaglobulinemia ([place])   . Hypothyroid   . 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  Neuropathy   . Osteoarthritis   . PTSD (post-traumatic stress disorder)   . Recurrent sinusitis   . Short-term memory loss   . Sleep apnea   . TIA (transient ischemic attack)   . Tremors of nervous system    seeing PCP for this.  . Type 2 diabetes mellitus ([place])   . Urticaria   . Wound infection    Past Surgical History:   Procedure Laterality Date  . 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 CARDIAC SURGERY    . CARDIOVERSION    . CARDIOVERSION N/A 08/29/2018   Procedure: CARDIOVERSION;  Surgeon: Arnoldo Lenis, MD;  Location: [facility];  Service: Endoscopy;  Laterality: N/A;  . [place]  . NASAL SINUS SURGERY  2017     Current Meds  Medication Sig  . acetaminophen (TYLENOL) 325 MG tablet Take 2 tablets (650 mg total) by mouth every 6 (six) hours as needed for mild pain, fever or headache.  . albuterol (VENTOLIN HFA) 108 (90 Base) MCG/ACT inhaler Inhale 2 puffs into the lungs every 6 (six) hours as needed for wheezing or shortness of breath.  . Albuterol Sulfate (PROAIR RESPICLICK) 123XX123 (90 Base) MCG/ACT AEPB Inhale 1 puff into the lungs every 6 (six) hours as needed (for shortness of breath/wheezing).   Marland Kitchen apixaban (ELIQUIS) 5 MG TABS tablet Take 5 mg by mouth 2 (two) times daily.  Marland Kitchen atorvastatin (LIPITOR) 10 MG tablet Take 10 mg by mouth every evening.   . diltiazem (CARDIZEM CD) 300 MG 24 hr capsule Take 300 mg by mouth every morning.   . divalproex (DEPAKOTE ER) 500 MG 24 hr tablet Take 3 tablets (1,500 mg total) by mouth 2 (two) times daily.  . Dulaglutide (TRULICITY) 1.5 0000000 SOPN Inject 1.5 mg into the skin every Sunday.   . furosemide (LASIX) 20 MG tablet Take 20-40 mg by mouth See admin instructions. Take 2 tablets (40mg  total) every morning and 1 tablet (20mg  total)  at noon.  . gabapentin (NEURONTIN) 600 MG tablet Take 600 mg by mouth 3 (three) times daily.  Marland Kitchen HUMALOG KWIKPEN 100 UNIT/ML KwikPen   . hydrALAZINE (APRESOLINE) 25 MG tablet Take 25 mg by mouth 3 (three) times daily.  . hydrochlorothiazide (HYDRODIURIL) 25 MG tablet Take 25 mg by mouth every morning.   . hydrOXYzine (ATARAX/VISTARIL) 25 MG tablet Take 1 tablet (25 mg total) by mouth every 6 (six) hours as needed for anxiety or nausea.  . Immune Globulin, Human, (CUVITRU) 4 GM/20ML SOLN Inject 100 mLs into the skin every  14 (fourteen) days.  . insulin glargine (LANTUS) 100 UNIT/ML injection Inject 60 Units into the skin at bedtime.  . lamoTRIgine (LAMICTAL) 25 MG tablet Take 1 tablet (25 mg total) by mouth daily.  Marland Kitchen levothyroxine (SYNTHROID, LEVOTHROID) 112 MCG tablet Take 112 mcg by mouth daily before breakfast.  . lisinopril (PRINIVIL,ZESTRIL) 40 MG tablet Take 40 mg by mouth every morning.   . Melatonin 3 MG TABS Take 3 mg by mouth at bedtime.  . metFORMIN (GLUCOPHAGE-XR) 500 MG 24 hr tablet Take 1,000 mg by mouth 2 (two) times daily.   . mometasone-formoterol (DULERA) 200-5 MCG/ACT AERO Inhale 2 puffs into the lungs 2 (two) times daily.  Marland Kitchen Spacer/Aero-Holding Chambers (AEROCHAMBER MV) inhaler Use as instructed  . tiotropium (SPIRIVA) 18 MCG inhalation capsule Place 18 mcg into inhaler and inhale daily as needed (for breathing).      Allergies:   Ativan [lorazepam] and Phenergan [promethazine hcl]  Social History   Tobacco Use  . Smoking status: Former Smoker    Packs/day: 1.00    Years: 17.00    Pack years: 17.00    Types: Cigarettes    Quit date: 2008    Years since quitting: 12.7  . Smokeless tobacco: Never Used  Substance Use Topics  . Alcohol use: Not Currently  . Drug use: Not Currently     Family Hx: The patient's family history includes Alcohol abuse in her mother; Allergic rhinitis in her daughter; Bipolar disorder in her sister; Celiac disease in her daughter; Hashimoto's thyroiditis in her daughter; Heart attack in her mother; Heart disease in her brother and maternal aunt; Hypertension in her mother and sister; Kidney disease in her mother and sister; Stroke in her maternal aunt and mother; Thyroid disease in her daughter, daughter, daughter, and sister. There is no history of Asthma.  ROS:   Please see the history of present illness.     All other systems reviewed and are negative.   Prior CV studies:   The following studies were reviewed today:    Labs/Other Tests and  Data Reviewed:    EKG:  No ECG reviewed.  Recent Labs: 08/10/2018: B Natriuretic Peptide 68.0 03/15/2019: ALT 12; BUN 30; Creatinine, Ser 1.04; Hemoglobin 13.8; Platelets 180; Potassium 4.7; Sodium 140   Recent Lipid Panel Lab Results  Component Value Date/Time   CHOL 111 08/11/2018 02:13 AM   TRIG 165 (H) 08/11/2018 02:13 AM   HDL 32 (L) 08/11/2018 02:13 AM   CHOLHDL 3.5 08/11/2018 02:13 AM   LDLCALC 46 08/11/2018 02:13 AM    Wt Readings from Last 3 Encounters:  06/28/19 253 lb (114.8 kg)  02/22/19 253 lb (114.8 kg)  12/26/18 252 lb (114.3 kg)     Objective:    Vital Signs:  Ht 5\' 4"  (1.626 m)   Wt 253 lb (114.8 kg)   BMI 43.43 kg/m    BP on 06/21/19: 120/70  VITAL SIGNS:  reviewed  ASSESSMENT & PLAN:    1. Paroxysmal atrial fibrillation and flutter: Status post direct-current cardioversion for atrial flutter on 08/29/2018.  Event monitoring in December 2019 showed no recurrence of atrial fibrillation or flutter with occasional PACs and PVCs. Symptomatically stable on long-acting diltiazem 300 mg daily. Systemically anticoagulated with Eliquis 5 mg twice daily.   2. History of ASD repair: Most recent echocardiogram in November 2019 showed no shunting, normal left ventricular systolic function, and severe LVH.  3. Obstructive sleep apnea: Followed by pulmonology.  4. Bronchiectasis and asthma: She uses [employer].  5. Common variable immunodeficiency: On immunoglobulin and followed by hematology.  6. Hypertension: Blood pressure is controlled on present therapy. No changes.  7. Insulin-dependent diabetes mellitus: Currently on insulin and metformin.  COVID-19 Education: The signs and symptoms of COVID-19 were discussed with the patient and how to seek care for testing (follow up with PCP or arrange E-visit).  The importance of social distancing was discussed today.  Time:   Today, I have spent 10 minutes with the patient with telehealth technology  discussing the above problems.     Medication Adjustments/Labs and Tests Ordered: Current medicines are reviewed at length with the patient today.  Concerns regarding medicines are outlined above.   Tests Ordered: No orders of the defined types were placed in this encounter.   Medication Changes: No orders of the defined types were placed in this encounter.   Follow Up:  In Person in 6 month(s)  Signed, Jamesetta So  Bronson Ing, MD  06/28/2019 9:04 AM    [place]

## 2019-06-28 NOTE — Patient Instructions (Signed)
Medication Instructions: Your physician recommends that you continue on your current medications as directed. Please refer to the Current Medication list given to you today.   Labwork: None today  Procedures/Testing: None today  Follow-Up: 6 months with Dr.Koneswarn  Any Additional Special Instructions Will Be Listed Below (If Applicable).     If you need a refill on your cardiac medications before your next appointment, please call your pharmacy.     Thank you for choosing [facility] !
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
## 2019-07-05 ENCOUNTER — Ambulatory Visit (INDEPENDENT_AMBULATORY_CARE_PROVIDER_SITE_OTHER): Payer: Medicare Other | Admitting: Licensed Clinical Social Worker

## 2019-07-05 ENCOUNTER — Encounter (HOSPITAL_COMMUNITY): Payer: Self-pay | Admitting: Licensed Clinical Social Worker

## 2019-07-05 ENCOUNTER — Other Ambulatory Visit: Payer: Self-pay

## 2019-07-05 DIAGNOSIS — F4310 Post-traumatic stress disorder, unspecified: Secondary | ICD-10-CM | POA: Diagnosis not present

## 2019-07-05 NOTE — Progress Notes (Signed)
Virtual Visit via Video Note  I connected with Laura [name] on 07/05/19 at 10:00 AM EDT by a video enabled telemedicine application and verified that I am speaking with the correct person using two identifiers.  I discussed the limitations of evaluation and management by telemedicine and the availability of in person appointments. The patient expressed understanding and agreed to proceed.   Participation Level: Active  Behavioral Response: CasualAlertDepressed  Type of Therapy: Individual Therapy  Treatment Goals addressed: Coping  Interventions: CBT and Solution Focused  Summary: Laura [name] is a 58 y.o. female who presents oriented x5 (person, place, situation, and time), casually dressed, appropriately groomed, average height, overweight, and cooperative to address mood. Patient has a history of medical treatment including hypothyroidism, hypertensions, and diabetes. Patient has a history of mental health treatment including outpatient therapy and medication management. Patient denies homicidal and suicidal ideations. Patient denies psychosis including auditory and visual hallucinations. Patient denies substance abuse. Patient is at low risk for lethality at this time.  Physically: Patient has not been active. She says that she has doctors telling her to exercise and make lifestyle changes. Patient does not do so. Patient also noted that she goes to bed late, wakes up around 7:30 am and goes back to bed until after 9 am. Patient agreed to look at her night routine and make appropriate changes.   Spiritually/values: No issues identified.  Relationships: Patient is getting along with her family. She spent time with her daughters and grandchildren. Patient feels lonely. She has not been able to interact with her children as much as she would like to much increases her feelings of loneliness. Patient continues to feel grief over her spouse. Marland Kitchen  Emotionally/Mentally/Behavior:  Patient's mood  is up and down. Patient's mood is impacted by her weight gain and how she feels physically. She feels down which causes her to feel lack of motivation which then leads to not making the lifestyle changes. Patient received the depression self care action plan, anxiety information, and thought record forms. She agrees to work on these forms between session.  Suicidal/Homicidal: Negativewithout intent/plan  Therapist Response: Therapist reviewed patient's recent thoughts and behaviors. Therapist utilized CBT to address mood. Therapist processed patient's feelings to identify triggers for mood. Therapist discussed with patient physical health and feelings of loneliness. Therapist provided psychoeducation on sleep. Therapist provided psychoeducation on CBT.    Plan: Return again in 2-3 weeks.  Diagnosis: Axis I: Post Traumatic Stress Disorder    Axis II: No diagnosis  I discussed the assessment and treatment plan with the patient. The patient was provided an opportunity to ask questions and all were answered. The patient agreed with the plan and demonstrated an understanding of the instructions.   The patient was advised to call back or seek an in-person evaluation if the symptoms worsen or if the condition fails to improve as anticipated.  I provided 45 minutes of non-face-to-face time during this encounter.   Glori Bickers, LCSW 07/05/2019

## 2019-07-12 ENCOUNTER — Ambulatory Visit (INDEPENDENT_AMBULATORY_CARE_PROVIDER_SITE_OTHER): Payer: Medicaid Other | Admitting: Psychiatry

## 2019-07-12 ENCOUNTER — Encounter (HOSPITAL_COMMUNITY): Payer: Self-pay | Admitting: Psychiatry

## 2019-07-12 ENCOUNTER — Other Ambulatory Visit: Payer: Self-pay

## 2019-07-12 DIAGNOSIS — F4310 Post-traumatic stress disorder, unspecified: Secondary | ICD-10-CM | POA: Diagnosis not present

## 2019-07-12 DIAGNOSIS — F0630 Mood disorder due to known physiological condition, unspecified: Secondary | ICD-10-CM

## 2019-07-12 MED ORDER — LAMOTRIGINE 25 MG PO TABS: 25.0000 mg | ORAL_TABLET | Freq: Every day | ORAL | 0 refills | Status: DC

## 2019-07-12 MED ORDER — DIVALPROEX SODIUM ER 500 MG PO TB24: 1500.0000 mg | ORAL_TABLET | Freq: Two times a day (BID) | ORAL | 0 refills | Status: DC

## 2019-07-12 NOTE — Patient Instructions (Signed)
1.ContinueDepakote1000 mg, 1500 mg at night 2.Continuelamotrigine 25mg  daily (tremor on higher dose) 3.Next appointment: 11/12 at 10:30 4. Obtain VPA level

## 2019-07-13 ENCOUNTER — Telehealth: Payer: Medicare Other | Admitting: Nurse Practitioner

## 2019-07-13 DIAGNOSIS — R197 Diarrhea, unspecified: Secondary | ICD-10-CM

## 2019-07-13 DIAGNOSIS — Z20822 Contact with and (suspected) exposure to covid-19: Secondary | ICD-10-CM

## 2019-07-13 DIAGNOSIS — R110 Nausea: Secondary | ICD-10-CM

## 2019-07-13 MED ORDER — ONDANSETRON HCL 4 MG PO TABS: 4.0000 mg | ORAL_TABLET | Freq: Three times a day (TID) | ORAL | 0 refills | Status: DC | PRN

## 2019-07-13 MED ORDER — AZITHROMYCIN 500 MG PO TABS: 500.0000 mg | ORAL_TABLET | Freq: Every day | ORAL | 0 refills | Status: DC

## 2019-07-13 NOTE — Progress Notes (Signed)
We are sorry that you are not feeling well.  Here is how we plan to help! thius is Laura [name] again- we will try zithromax and see if helps as well as zofran for nausea.  Based on what you have shared with me it looks like you have Acute Infectious Diarrhea.  Most cases of acute diarrhea are due to infections with virus and bacteria and are self-limited conditions lasting less than 14 days.  For your symptoms you may take Imodium 2 mg tablets that are over the counter at your local pharmacy. Take two tablet now and then one after each loose stool up to 6 a day.  Antibiotics are not needed for most people with diarrhea.  Optional: Zofran 4 mg 1 tablet every 8 hours as needed for nausea and vomiting  Optional: I have prescribed azithromycin 500 mg daily for 3 days  HOME CARE  We recommend changing your diet to help with your symptoms for the next few days.  Drink plenty of fluids that contain water salt and sugar. Sports drinks such as Gatorade may help.   You may try broths, soups, bananas, applesauce, soft breads, mashed potatoes or crackers.   You are considered infectious for as long as the diarrhea continues. Hand washing or use of alcohol based hand sanitizers is recommend.  It is best to stay out of work or school until your symptoms stop.   GET HELP RIGHT AWAY  If you have dark yellow colored urine or do not pass urine frequently you should drink more fluids.    If your symptoms worsen   If you feel like you are going to pass out (faint)  You have a new problem  MAKE SURE YOU   Understand these instructions.  Will watch your condition.  Will get help right away if you are not doing well or get worse.  Your e-visit answers were reviewed by a board certified advanced clinical practitioner to complete your personal care plan.  Depending on the condition, your plan could have included both over the counter or prescription medications.  If there is a problem please reply  once  you have received a response from your provider.  Your safety is important to [place].  If you have drug allergies check your prescription carefully.    You can use MyChart to ask questions about today's visit, request a non-urgent call back, or ask for a work or school excuse for 24 hours related to this e-Visit. If it has been greater than 24 hours you will need to follow up with your provider, or enter a new e-Visit to address those concerns.   You will get an e-mail in the next two days asking about your experience.  I hope that your e-visit has been valuable and will speed your recovery. Thank you for using e-visits.  5-10 minutes spent reviewing and documenting in chart.

## 2019-07-13 NOTE — Progress Notes (Signed)
E-Visit for Corona Virus Screening   Your current symptoms could be consistent with the coronavirus.  Many health care providers can now test patients at their office but not all are.  [facility] has multiple testing sites. For information on our COVID testing locations and hours go to [email or web address]  Please quarantine yourself while awaiting your test results.  We are enrolling you in our [facility] for [place] . Daily you will receive a questionnaire within the [facility] website. Our COVID 19 response team willl be monitoriing your responses daily.  You can go to one of the  testing sites listed below, while they are opened (see hours). You do not need a doctors order to be tested for covid.You do need to self-isolate until your results return and if positive 14 days from when your symptoms started and until you are 3 days symptom free.   Testing Locations (Monday - Friday, 8 a.m. - 3:30 p.m.) . [place]: [facility] at [facility], [address] [place], [place]: [place], [place], [place], [place] (entrance off [address])  . [place] [address] [place], [place] (across from [facility] Emergency Department)    COVID-19 is a respiratory illness with symptoms that are similar to the flu. Symptoms are typically mild to moderate, but there have been cases of severe illness and death due to the virus. The following symptoms may appear 2-14 days after exposure: . Fever . Cough . Shortness of breath or difficulty breathing . Chills . Repeated shaking with chills . Muscle pain . Headache . Sore throat . New loss of taste or smell . Fatigue . Congestion or runny nose . Nausea or vomiting . Diarrhea  It is vitally important that if you feel that you have an infection such as this virus or any other virus that you stay home and away from places where you may  spread it to others.  You should self-quarantine for 14 days if you have symptoms that could potentially be coronavirus or have been in close contact a with a person diagnosed with COVID-19 within the last 2 weeks. You should avoid contact with people age 47 and older.   You should wear a mask or cloth face covering over your nose and mouth if you must be around other people or animals, including pets (even at home). Try to stay at least 6 feet away from other people. This will protect the people around you.  Imodium otc FOR DIARRHEA   You may also take acetaminophen (Tylenol) as needed for fever.   Reduce your risk of any infection by using the same precautions used for avoiding the common cold or flu:  Marland Kitchen Wash your hands often with soap and warm water for at least 20 seconds.  If soap and water are not readily available, use an alcohol-based hand sanitizer with at least 60% alcohol.  . If coughing or sneezing, cover your mouth and nose by coughing or sneezing into the elbow areas of your shirt or coat, into a tissue or into your sleeve (not your hands). . Avoid shaking hands with others and consider head nods or verbal greetings only. . Avoid touching your eyes, nose, or mouth with unwashed hands.  . Avoid close contact with people who are sick. . Avoid places or events with large numbers of people in one location, like concerts or sporting events. . Carefully consider travel plans you have or  are making. . If you are planning any travel outside or inside the [place], visit the CDC's Travelers' Health webpage for the latest health notices. . If you have some symptoms but not all symptoms, continue to monitor at home and seek medical attention if your symptoms worsen. . If you are having a medical emergency, call 911.  HOME CARE . Only take medications as instructed by your medical team. . Drink plenty of fluids and get plenty of rest. . A steam or ultrasonic humidifier can help if you have  congestion.   GET HELP RIGHT AWAY IF YOU HAVE EMERGENCY WARNING SIGNS** FOR COVID-19. If you or someone is showing any of these signs seek emergency medical care immediately. Call 911 or proceed to your closest emergency facility if: . You develop worsening high fever. . Trouble breathing . Bluish lips or face . Persistent pain or pressure in the chest . New confusion . Inability to wake or stay awake . You cough up blood. . Your symptoms become more severe  **This list is not all possible symptoms. Contact your medical provider for any symptoms that are sever or concerning to you.   MAKE SURE YOU   Understand these instructions.  Will watch your condition.  Will get help right away if you are not doing well or get worse.  Your e-visit answers were reviewed by a board certified advanced clinical practitioner to complete your personal care plan.  Depending on the condition, your plan could have included both over the counter or prescription medications.  If there is a problem please reply once you have received a response from your provider.  Your safety is important to [place].  If you have drug allergies check your prescription carefully.    You can use MyChart to ask questions about today's visit, request a non-urgent call back, or ask for a work or school excuse for 24 hours related to this e-Visit. If it has been greater than 24 hours you will need to follow up with your provider, or enter a new e-Visit to address those concerns. You will get an e-mail in the next two days asking about your experience.  I hope that your e-visit has been valuable and will speed your recovery. Thank you for using e-visits.   5-10 minutes spent reviewing and documenting in chart.

## 2019-07-14 ENCOUNTER — Emergency Department (HOSPITAL_COMMUNITY)
Admission: EM | Admit: 2019-07-14 | Discharge: 2019-07-14 | Disposition: A | Discharge status: Home / Self Care | Payer: Medicare Other | Attending: Emergency Medicine | Admitting: Emergency Medicine

## 2019-07-14 ENCOUNTER — Other Ambulatory Visit: Payer: Self-pay

## 2019-07-14 ENCOUNTER — Encounter (HOSPITAL_COMMUNITY): Payer: Self-pay

## 2019-07-14 DIAGNOSIS — A084 Viral intestinal infection, unspecified: Secondary | ICD-10-CM | POA: Diagnosis not present

## 2019-07-14 DIAGNOSIS — E119 Type 2 diabetes mellitus without complications: Secondary | ICD-10-CM | POA: Diagnosis not present

## 2019-07-14 DIAGNOSIS — E860 Dehydration: Secondary | ICD-10-CM | POA: Diagnosis not present

## 2019-07-14 DIAGNOSIS — Z79899 Other long term (current) drug therapy: Secondary | ICD-10-CM | POA: Diagnosis not present

## 2019-07-14 DIAGNOSIS — I509 Heart failure, unspecified: Secondary | ICD-10-CM | POA: Insufficient documentation

## 2019-07-14 DIAGNOSIS — Z87891 Personal history of nicotine dependence: Secondary | ICD-10-CM | POA: Insufficient documentation

## 2019-07-14 DIAGNOSIS — Z7901 Long term (current) use of anticoagulants: Secondary | ICD-10-CM | POA: Insufficient documentation

## 2019-07-14 DIAGNOSIS — Z20828 Contact with and (suspected) exposure to other viral communicable diseases: Secondary | ICD-10-CM | POA: Diagnosis not present

## 2019-07-14 DIAGNOSIS — R197 Diarrhea, unspecified: Secondary | ICD-10-CM | POA: Diagnosis present

## 2019-07-14 DIAGNOSIS — Z8673 Personal history of transient ischemic attack (TIA), and cerebral infarction without residual deficits: Secondary | ICD-10-CM | POA: Insufficient documentation

## 2019-07-14 LAB — COMPREHENSIVE METABOLIC PANEL
ALT: 15 U/L (ref 0–44)
AST: 16 U/L (ref 15–41)
Albumin: 4.3 g/dL (ref 3.5–5.0)
Alkaline Phosphatase: 71 U/L (ref 38–126)
Anion gap: 15 (ref 5–15)
BUN: 32 mg/dL — ABNORMAL HIGH (ref 6–20)
CO2: 21 mmol/L — ABNORMAL LOW (ref 22–32)
Calcium: 9 mg/dL (ref 8.9–10.3)
Chloride: 103 mmol/L (ref 98–111)
Creatinine, Ser: 1.47 mg/dL — ABNORMAL HIGH (ref 0.44–1.00)
GFR calc Af Amer: 45 mL/min — ABNORMAL LOW (ref >60)
GFR calc non Af Amer: 39 mL/min — ABNORMAL LOW (ref >60)
Glucose, Bld: 155 mg/dL — ABNORMAL HIGH (ref 70–99)
Potassium: 4.2 mmol/L (ref 3.5–5.1)
Sodium: 139 mmol/L (ref 135–145)
Total Bilirubin: 0.7 mg/dL (ref 0.3–1.2)
Total Protein: 7.5 g/dL (ref 6.5–8.1)

## 2019-07-14 LAB — URINALYSIS, ROUTINE W REFLEX MICROSCOPIC
Bilirubin Urine: NEGATIVE
Glucose, UA: NEGATIVE mg/dL
Hgb urine dipstick: NEGATIVE
Ketones, ur: NEGATIVE mg/dL
Leukocytes,Ua: NEGATIVE
Nitrite: NEGATIVE
Protein, ur: NEGATIVE mg/dL
Specific Gravity, Urine: 1.008 (ref 1.005–1.030)
pH: 5 (ref 5.0–8.0)

## 2019-07-14 LAB — CBC WITH DIFFERENTIAL/PLATELET
Abs Immature Granulocytes: 0.05 10*3/uL (ref 0.00–0.07)
Basophils Absolute: 0 10*3/uL (ref 0.0–0.1)
Basophils Relative: 1 %
Eosinophils Absolute: 0 10*3/uL (ref 0.0–0.5)
Eosinophils Relative: 0 %
HCT: 42.5 % (ref 36.0–46.0)
Hemoglobin: 14.5 g/dL (ref 12.0–15.0)
Immature Granulocytes: 1 %
Lymphocytes Relative: 45 %
Lymphs Abs: 2.4 10*3/uL (ref 0.7–4.0)
MCH: 33.2 pg (ref 26.0–34.0)
MCHC: 34.1 g/dL (ref 30.0–36.0)
MCV: 97.3 fL (ref 80.0–100.0)
Monocytes Absolute: 0.4 10*3/uL (ref 0.1–1.0)
Monocytes Relative: 8 %
Neutro Abs: 2.4 10*3/uL (ref 1.7–7.7)
Neutrophils Relative %: 45 %
Platelets: 198 10*3/uL (ref 150–400)
RBC: 4.37 MIL/uL (ref 3.87–5.11)
RDW: 12.3 % (ref 11.5–15.5)
WBC: 5.3 10*3/uL (ref 4.0–10.5)
nRBC: 0 % (ref 0.0–0.2)

## 2019-07-14 LAB — SARS CORONAVIRUS 2 BY RT PCR (HOSPITAL ORDER, PERFORMED IN ~~LOC~~ HOSPITAL LAB): SARS Coronavirus 2: NEGATIVE

## 2019-07-14 MED ORDER — LOPERAMIDE HCL 2 MG PO CAPS
4.0000 mg | ORAL_CAPSULE | Freq: Once | ORAL | Status: AC
  Administered 2019-07-14: 4 mg via ORAL
  Filled 2019-07-14: qty 2

## 2019-07-14 MED ORDER — DIPHENHYDRAMINE HCL 50 MG/ML IJ SOLN
12.5000 mg | Freq: Once | INTRAMUSCULAR | Status: AC
  Administered 2019-07-14: 14:00:00 12.5 mg via INTRAVENOUS
  Filled 2019-07-14: qty 1

## 2019-07-14 MED ORDER — DEXAMETHASONE SODIUM PHOSPHATE 10 MG/ML IJ SOLN
10.0000 mg | Freq: Once | INTRAMUSCULAR | Status: AC
  Administered 2019-07-14: 10 mg via INTRAVENOUS
  Filled 2019-07-14: qty 1

## 2019-07-14 MED ORDER — KETOROLAC TROMETHAMINE 30 MG/ML IJ SOLN
15.0000 mg | Freq: Once | INTRAMUSCULAR | Status: AC
  Administered 2019-07-14: 15 mg via INTRAVENOUS
  Filled 2019-07-14: qty 1

## 2019-07-14 MED ORDER — SODIUM CHLORIDE 0.9 % IV BOLUS
1000.0000 mL | Freq: Once | INTRAVENOUS | Status: AC
  Administered 2019-07-14: 1000 mL via INTRAVENOUS

## 2019-07-14 MED ORDER — ONDANSETRON HCL 4 MG/2ML IJ SOLN
4.0000 mg | Freq: Once | INTRAMUSCULAR | Status: AC
  Administered 2019-07-14: 4 mg via INTRAVENOUS
  Filled 2019-07-14: qty 2

## 2019-07-14 MED ORDER — PROCHLORPERAZINE EDISYLATE 10 MG/2ML IJ SOLN
10.0000 mg | Freq: Once | INTRAMUSCULAR | Status: AC
  Administered 2019-07-14: 10 mg via INTRAVENOUS
  Filled 2019-07-14: qty 2

## 2019-07-14 NOTE — ED Triage Notes (Signed)
Pt reports that she started feeling bad on Wednesday. No appetite. Diarrhea started Wednesday and has continued . Pt reports 6 loose yellow stools in 24 hrs. Pt reports fever 101 this morning and HA. Pt has hx of asthma and feels sOB. Pt took tylenol 30 mins ago

## 2019-07-14 NOTE — Discharge Instructions (Addendum)
Get the zofran prescription from your primary doctor picked up so you will have this for any further nausea or vomiting.  Your lab tests here are reassuring including a negative Covid test today.  As discussed you were dehydrated as your creatinine test (measure of kidney function) was reduced with a result of 1.47 mg/dl.  You should have this rechecked in a week to make sure it is improved.  Make sure you are drinking plenty of fluids.  Continue taking imodium if needed for diarrhea.  You may find the b.r.a.t. diet helpful (bananas, rice, apple sauce, toast).

## 2019-07-14 NOTE — ED Provider Notes (Signed)
[facility] EMERGENCY DEPARTMENT Provider Note   CSN: [id number] Arrival date & time: 07/14/19  1026     History   Chief Complaint Chief Complaint  Patient presents with  . Diarrhea    HPI Laura [name] is a 58 y.o. 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  She also reports fever to 101 today along with a generalized headache. She has had a mild non productive cough without shortness of breath. She denies wheezing or chest pain, also denies neck pain or stiffness, no dizziness, but does feel weak when she stands.  She has taken tylenol with improvement in fever but not headache.  She has had no known covid contacts, no travel or consumption of suspicious foods, no recent abx use. She had an e visit with her pcp 2 days ago and was prescribed zofran which she has not yet picked up.      The history is provided by the patient.    Past Medical History:  Diagnosis Date  . Asthma   . Atrial fibrillation ([place])   . Bipolar disorder ([place])   . Bronchiectasis ([place])   . CHF (congestive heart failure) ([place])   . Complication of anesthesia    "my Dr in [place] told me I had an allergic reaction to the combination of the pain meds and the anesthesia" she does not remember what happened but "I eneded up in the ICU for 5 days".  . Hypogammaglobulinemia ([place])   . Hypothyroid   . IgE deficiency ([place])   . Neuropathy   . Osteoarthritis   . PTSD (post-traumatic stress disorder)   . Recurrent sinusitis   . Short-term memory loss   . Sleep apnea   . TIA (transient ischemic attack)   . Tremors of nervous system    seeing PCP for this.  .  Type 2 diabetes mellitus ([place])   . Urticaria   . Wound infection     Patient Active Problem List   Diagnosis Date Noted  . Atypical chest pain 08/10/2018  . Bipolar disorder ([place]) 08/10/2018  . Headache 08/10/2018  . H/O atrial flutter 08/10/2018  . OSA on CPAP 08/10/2018  . Anxiety 08/10/2018  . Uncontrolled type 2 diabetes mellitus with hyperglycemia ([place]) 03/13/2018  . Hypothyroidism 03/13/2018  . Essential hypertension, benign 03/13/2018  . Mixed hyperlipidemia 03/13/2018  . 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 Non-allergic rhinitis 02/06/2018  . Severe persistent asthma, uncomplicated [id number]  . Pulmonary nodules 02/06/2018  . Bronchiectasis without complication ([place]) [id number]  . DM type 2 causing vascular disease ([place]) 02/06/2018    Past Surgical History:  Procedure Laterality Date  . 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 CARDIAC SURGERY    . CARDIOVERSION    . CARDIOVERSION N/A 08/29/2018   Procedure: CARDIOVERSION;  Surgeon: Arnoldo Lenis, MD;  Location: [facility] END[facility];  Service: Endoscopy;  Laterality: N/A;  . [place]  . NASAL SINUS SURGERY  2017     OB History   No obstetric history on file.      Home Medications    Prior to Admission medications   Medication Sig Start Date End Date Taking? Authorizing Provider  acetaminophen (  TYLENOL) 325 MG tablet Take 2 tablets (650 mg total) by mouth every 6 (six) hours as needed for mild pain, fever or headache. 08/11/18  Yes Emokpae, Courage, MD  albuterol (VENTOLIN HFA) 108 (90 Base) MCG/ACT inhaler Inhale 2 puffs into the lungs every 6 (six) hours as needed for wheezing or shortness of breath. 06/21/19  Yes Valentina Shaggy, MD  apixaban (ELIQUIS) 5 MG TABS tablet Take 5 mg by mouth 2 (two) times daily.   Yes [provider]  atorvastatin (LIPITOR) 10 MG tablet Take 10 mg by mouth every evening.    Yes [provider]  diltiazem (CARDIZEM CD) 300 MG 24 hr capsule Take 300  mg by mouth every morning.    Yes [provider]  divalproex (DEPAKOTE ER) 500 MG 24 hr tablet Take 3 tablets (1,500 mg total) by mouth 2 (two) times daily. 1000 mg in AM, 1500 mg in PM Patient taking differently: Take 522 mg by mouth 2 (two) times daily. 1000 mg in AM, 1500 mg in PM 07/12/19  Yes Hisada, Elie Goody, MD  Dulaglutide (TRULICITY) 1.5 0000000 SOPN Inject 1.5 mg into the skin every Sunday.    Yes [provider]  furosemide (LASIX) 20 MG tablet Take 20-40 mg by mouth See admin instructions. Take 2 tablets (40mg  total) every morning and 1 tablet (20mg  total)  at noon.   Yes [provider]  gabapentin (NEURONTIN) 600 MG tablet Take 600 mg by mouth 3 (three) times daily.   Yes [provider]  HUMALOG KWIKPEN 100 UNIT/ML KwikPen Sliding scale. 08/28/18  Yes [provider]  hydrALAZINE (APRESOLINE) 25 MG tablet Take 25 mg by mouth 3 (three) times daily.   Yes [provider]  hydrochlorothiazide (HYDRODIURIL) 25 MG tablet Take 25 mg by mouth every morning.    Yes [provider]  hydrOXYzine (ATARAX/VISTARIL) 25 MG tablet Take 1 tablet (25 mg total) by mouth every 6 (six) hours as needed for anxiety or nausea. 08/11/18  Yes Emokpae, Courage, MD  Immune Globulin, Human, (CUVITRU) 4 GM/20ML SOLN Inject 100 mLs into the skin every 14 (fourteen) days.   Yes [provider]  insulin glargine (LANTUS) 100 UNIT/ML injection Inject 60 Units into the skin at bedtime.   Yes [provider]  lamoTRIgine (LAMICTAL) 25 MG tablet Take 1 tablet (25 mg total) by mouth daily. 08/16/19  Yes Hisada, Elie Goody, MD  levothyroxine (SYNTHROID, LEVOTHROID) 112 MCG tablet Take 112 mcg by mouth daily before breakfast.   Yes [provider]  lisinopril (PRINIVIL,ZESTRIL) 40 MG tablet Take 40 mg by mouth every morning.    Yes [provider]  Melatonin 3 MG TABS Take 3 mg by mouth at bedtime.   Yes [provider]   metFORMIN (GLUCOPHAGE-XR) 500 MG 24 hr tablet Take 1,000 mg by mouth 2 (two) times daily.  04/11/18  Yes [provider]  mometasone-formoterol (DULERA) 200-5 MCG/ACT AERO Inhale 2 puffs into the lungs 2 (two) times daily. 06/21/19  Yes Valentina Shaggy, MD  ondansetron (ZOFRAN) 4 MG tablet Take 1 tablet (4 mg total) by mouth every 8 (eight) hours as needed for nausea or vomiting. 07/13/19  Yes Chevis Pretty, FNP  Spacer/Aero-Holding Chambers (AEROCHAMBER MV) inhaler Use as instructed 10/24/18  Yes Juanito Doom, MD  tiotropium (SPIRIVA) 18 MCG inhalation capsule Place 18 mcg into inhaler and inhale daily as needed (for breathing).    Yes [provider]  Albuterol Sulfate (Colome) 123XX123 (90 Base)  MCG/ACT AEPB Inhale 1 puff into the lungs every 6 (six) hours as needed (for shortness of breath/wheezing).     [provider]  azithromycin (ZITHROMAX) 500 MG tablet Take 1 tablet (500 mg total) by mouth daily. Patient not taking: Reported on 07/14/2019 07/13/19   Chevis Pretty, FNP    Family History Family History  Problem Relation Age of Onset  . Hypertension Mother   . Stroke Mother   . Kidney disease Mother   . Heart attack Mother   . Alcohol abuse Mother   . Kidney disease Sister   . Heart disease Brother   . Stroke Maternal Aunt   . Heart disease Maternal Aunt   . Hypertension Sister   . Bipolar disorder Sister   . Thyroid disease Sister   . Thyroid disease Daughter   . Thyroid disease Daughter   . Hashimoto's thyroiditis Daughter   . Celiac disease Daughter   . Allergic rhinitis Daughter   . Thyroid disease Daughter   . Asthma Neg Hx     Social History Social History   Tobacco Use  . Smoking status: Former Smoker    Packs/day: 1.00    Years: 17.00    Pack years: 17.00    Types: Cigarettes    Quit date: 2008    Years since quitting: 12.7  . Smokeless tobacco: Never Used  Substance Use Topics  . Alcohol use: Not  Currently  . Drug use: Not Currently     Allergies   Ativan [lorazepam] and Phenergan [promethazine hcl]   Review of Systems Review of Systems  Constitutional: Positive for fever.  HENT: Negative for congestion and sore throat.   Eyes: Negative.   Respiratory: Positive for cough and shortness of breath. Negative for chest tightness, wheezing and stridor.   Cardiovascular: Negative for chest pain.  Gastrointestinal: Positive for diarrhea, nausea and vomiting. Negative for abdominal pain.  Genitourinary: Negative.  Negative for decreased urine volume and dysuria.  Musculoskeletal: Positive for myalgias. Negative for arthralgias, joint swelling, neck pain and neck stiffness.  Skin: Negative.  Negative for rash and wound.  Neurological: Negative for dizziness, weakness, light-headedness, numbness and headaches.  Psychiatric/Behavioral: Negative.      Physical Exam Updated Vital Signs BP 132/66   Pulse 79   Temp 97.7 F (36.5 C) (Oral)   Resp 16   Ht 5\' 4"  (1.626 m)   Wt 114 kg   SpO2 98%   BMI 43.14 kg/m   Physical Exam Vitals signs and nursing note reviewed.  Constitutional:      Appearance: She is well-developed.     Comments: Uncomfortable appearing  HENT:     Head: Normocephalic and atraumatic.     Nose: Nose normal.     Mouth/Throat:     Mouth: Mucous membranes are dry.  Eyes:     Conjunctiva/sclera: Conjunctivae normal.     Pupils: Pupils are equal, round, and reactive to light.  Neck:     Musculoskeletal: Normal range of motion and neck supple.  Cardiovascular:     Rate and Rhythm: Normal rate and regular rhythm.     Heart sounds: Normal heart sounds.  Pulmonary:     Effort: Pulmonary effort is normal. No respiratory distress.     Breath sounds: Normal breath sounds. No stridor. No wheezing or rhonchi.  Abdominal:     General: Bowel sounds are normal. There is no distension.     Palpations: Abdomen is soft.     Tenderness: There is no  abdominal  tenderness. There is no guarding.  Musculoskeletal: Normal range of motion.  Lymphadenopathy:     Cervical: No cervical adenopathy.  Skin:    General: Skin is warm and dry.     Findings: No rash.  Neurological:     General: No focal deficit present.     Mental Status: She is alert and oriented to person, place, and time.     GCS: GCS eye subscore is 4. GCS verbal subscore is 5. GCS motor subscore is 6.     Cranial Nerves: No cranial nerve deficit.     Sensory: No sensory deficit.     Gait: Gait normal.     Comments: Equal grip strength. Grossly normal neuro exam, moving all extremities.   Psychiatric:        Speech: Speech normal.        Behavior: Behavior normal.        Thought Content: Thought content normal.      ED Treatments / Results  Labs (all labs ordered are listed, but only abnormal results are displayed) Labs Reviewed  COMPREHENSIVE METABOLIC PANEL - Abnormal; Notable for the following components:      Result Value   CO2 21 (*)    Glucose, Bld 155 (*)    BUN 32 (*)    Creatinine, Ser 1.47 (*)    GFR calc non Af Amer 39 (*)    GFR calc Af Amer 45 (*)    All other components within normal limits  URINALYSIS, ROUTINE W REFLEX MICROSCOPIC - Abnormal; Notable for the following components:   Color, Urine STRAW (*)    All other components within normal limits  SARS CORONAVIRUS 2 (HOSPITAL ORDER, [facility])  CBC WITH DIFFERENTIAL/PLATELET    EKG None  Radiology No results found.  Procedures Procedures (including critical care time)  Medications Ordered in ED Medications  ondansetron (ZOFRAN) injection 4 mg (4 mg Intravenous Given 07/14/19 1116)  ketorolac (TORADOL) 30 MG/ML injection 15 mg (15 mg Intravenous Given 07/14/19 1116)  sodium chloride 0.9 % bolus 1,000 mL (0 mLs Intravenous Stopped 07/14/19 1210)  sodium chloride 0.9 % bolus 1,000 mL (0 mLs Intravenous Stopped 07/14/19 1332)  loperamide (IMODIUM) capsule 4 mg (4 mg Oral  Given 07/14/19 1402)  prochlorperazine (COMPAZINE) injection 10 mg (10 mg Intravenous Given 07/14/19 1400)  dexamethasone (DECADRON) injection 10 mg (10 mg Intravenous Given 07/14/19 1400)  diphenhydrAMINE (BENADRYL) injection 12.5 mg (12.5 mg Intravenous Given 07/14/19 1401)     Initial Impression / Assessment and Plan / ED Course  I have reviewed the triage vital signs and the nursing notes.  Pertinent labs & imaging results that were available during my care of the patient were reviewed by me and considered in my medical decision making (see chart for details).        Pt given IV fluids and zofran. No vomiting or diarrhea while here.  She had persistent headache which improved with migraine cocktail.  Labs stable except bun/creatinine elevated confirming dehydration.  Covid negative. Pt advised increased home fluids,  Pick up her zofran (prescribed by pcp) , additional imodium prn (first dose given here).  Return precautions discussed.  Advised she will need a recheck of her creatinine in 1 week - f/u with pcp for this.  The patient appears reasonably screened and/or stabilized for discharge and I doubt any other medical condition or other [facility] requiring further screening, evaluation, or treatment in the ED at this time prior  to discharge.   Final Clinical Impressions(s) / ED Diagnoses   Final diagnoses:  Viral gastroenteritis  Dehydration    ED Discharge Orders    None       Landis Martins 07/14/19 1606    Milton Ferguson, MD 07/15/19 1510

## 2019-07-15 DIAGNOSIS — A084 Viral intestinal infection, unspecified: Secondary | ICD-10-CM | POA: Diagnosis not present

## 2019-08-02 ENCOUNTER — Ambulatory Visit (INDEPENDENT_AMBULATORY_CARE_PROVIDER_SITE_OTHER): Payer: Medicaid Other | Admitting: Licensed Clinical Social Worker

## 2019-08-02 ENCOUNTER — Other Ambulatory Visit: Payer: Self-pay

## 2019-08-02 ENCOUNTER — Encounter (HOSPITAL_COMMUNITY): Payer: Self-pay | Admitting: Licensed Clinical Social Worker

## 2019-08-02 DIAGNOSIS — F4310 Post-traumatic stress disorder, unspecified: Secondary | ICD-10-CM

## 2019-08-02 NOTE — Progress Notes (Signed)
Virtual Visit via Video Note  I connected with Laura [name] on 08/02/19 at  9:00 AM EDT by a video enabled telemedicine application and verified that I am speaking with the correct person using two identifiers.  I discussed the limitations of evaluation and management by telemedicine and the availability of in person appointments. The patient expressed understanding and agreed to proceed.   Participation Level: Active  Behavioral Response: CasualAlertDepressed  Type of Therapy: Individual Therapy  Treatment Goals addressed: Coping  Interventions: CBT and Solution Focused  Summary: Laura [name] is a 58 y.o. female who presents oriented x5 (person, place, situation, and time), casually dressed, appropriately groomed, average height, overweight, and cooperative to address mood. Patient has a history of medical treatment including hypothyroidism, hypertensions, and diabetes. Patient has a history of mental health treatment including outpatient therapy and medication management. Patient denies homicidal and suicidal ideations. Patient denies psychosis including auditory and visual hallucinations. Patient denies substance abuse. Patient is at low risk for lethality at this time.  Physically: Patient has started to exercise and cleaned up her eating. She has already lost a few pounds after making these choices. Patient continues to have disrupted sleep but is not waking up tired.   Spiritually/values: No issues identified.  Relationships: Patient is getting along with her family. She has plans to go and visit family. Patient is also spending time with her sister. Patient has joined a [employer] investment [employer] keto" group and feels connected to others. She has invited friends in the area to join the group and many have taken the offer. Patient feels like others care for her.  Emotionally/Mentally/Behavior:  Patient's mood has improved. She is exercising (walking), cleaning up her eating, she has gone camping, gone to  play bingo, and has started to clean her room. Patient feels better through taking these steps. After discussion, patient understood that she has had the skills and resources to resolve her concerns the whole time but had "forgotten" them due to life circumstances. Patient agreed to be consistent with the actions that she has taken.   Suicidal/Homicidal: Negativewithout intent/plan  Therapist Response: Therapist reviewed patient's recent thoughts and behaviors. Therapist utilized CBT to address mood. Therapist processed patient's feelings to identify triggers for mood. Therapist had patient identify what has improved and assisted patient in identifying the skills and resources she has to resolve her concerns.   Plan: Return again in 3-4 weeks.  Diagnosis: Axis I: Post Traumatic Stress Disorder    Axis II: No diagnosis  I discussed the assessment and treatment plan with the patient. The patient was provided an opportunity to ask questions and all were answered. The patient agreed with the plan and demonstrated an understanding of the instructions.   The patient was advised to call back or seek an in-person evaluation if the symptoms worsen or if the condition fails to improve as anticipated.  I provided 45 minutes of non-face-to-face time during this encounter.   Glori Bickers, LCSW 08/02/2019

## 2019-08-06 DIAGNOSIS — E1140 Type 2 diabetes mellitus with diabetic neuropathy, unspecified: Secondary | ICD-10-CM | POA: Diagnosis not present

## 2019-08-06 DIAGNOSIS — E782 Mixed hyperlipidemia: Secondary | ICD-10-CM | POA: Diagnosis not present

## 2019-08-06 DIAGNOSIS — I10 Essential (primary) hypertension: Secondary | ICD-10-CM | POA: Diagnosis not present

## 2019-08-06 DIAGNOSIS — E039 Hypothyroidism, unspecified: Secondary | ICD-10-CM | POA: Diagnosis not present

## 2019-08-06 DIAGNOSIS — E1142 Type 2 diabetes mellitus with diabetic polyneuropathy: Secondary | ICD-10-CM | POA: Diagnosis not present

## 2019-08-09 DIAGNOSIS — G250 Essential tremor: Secondary | ICD-10-CM | POA: Diagnosis not present

## 2019-08-09 DIAGNOSIS — D801 Nonfamilial hypogammaglobulinemia: Secondary | ICD-10-CM | POA: Diagnosis not present

## 2019-08-09 DIAGNOSIS — I10 Essential (primary) hypertension: Secondary | ICD-10-CM | POA: Diagnosis not present

## 2019-08-09 DIAGNOSIS — J4550 Severe persistent asthma, uncomplicated: Secondary | ICD-10-CM | POA: Diagnosis not present

## 2019-08-09 DIAGNOSIS — F4310 Post-traumatic stress disorder, unspecified: Secondary | ICD-10-CM | POA: Diagnosis not present

## 2019-08-09 DIAGNOSIS — Z23 Encounter for immunization: Secondary | ICD-10-CM | POA: Diagnosis not present

## 2019-08-09 DIAGNOSIS — F0630 Mood disorder due to known physiological condition, unspecified: Secondary | ICD-10-CM | POA: Diagnosis not present

## 2019-08-09 DIAGNOSIS — E782 Mixed hyperlipidemia: Secondary | ICD-10-CM | POA: Diagnosis not present

## 2019-08-09 DIAGNOSIS — E039 Hypothyroidism, unspecified: Secondary | ICD-10-CM | POA: Diagnosis not present

## 2019-08-09 DIAGNOSIS — R944 Abnormal results of kidney function studies: Secondary | ICD-10-CM | POA: Diagnosis not present

## 2019-08-09 DIAGNOSIS — I482 Chronic atrial fibrillation, unspecified: Secondary | ICD-10-CM | POA: Diagnosis not present

## 2019-08-09 DIAGNOSIS — E1142 Type 2 diabetes mellitus with diabetic polyneuropathy: Secondary | ICD-10-CM | POA: Diagnosis not present

## 2019-08-10 ENCOUNTER — Other Ambulatory Visit: Payer: Self-pay | Admitting: Allergy & Immunology

## 2019-08-13 ENCOUNTER — Other Ambulatory Visit (HOSPITAL_COMMUNITY): Payer: Self-pay | Admitting: Specialist

## 2019-08-13 ENCOUNTER — Other Ambulatory Visit: Payer: Self-pay | Admitting: Obstetrics & Gynecology

## 2019-08-13 ENCOUNTER — Other Ambulatory Visit (HOSPITAL_COMMUNITY): Payer: Self-pay | Admitting: Orthopedic Surgery

## 2019-08-13 ENCOUNTER — Other Ambulatory Visit (HOSPITAL_COMMUNITY): Payer: Self-pay | Admitting: Obstetrics & Gynecology

## 2019-08-13 DIAGNOSIS — M25562 Pain in left knee: Secondary | ICD-10-CM

## 2019-08-13 DIAGNOSIS — M722 Plantar fascial fibromatosis: Secondary | ICD-10-CM | POA: Diagnosis not present

## 2019-08-13 DIAGNOSIS — M47816 Spondylosis without myelopathy or radiculopathy, lumbar region: Secondary | ICD-10-CM | POA: Diagnosis not present

## 2019-08-13 DIAGNOSIS — S83282A Other tear of lateral meniscus, current injury, left knee, initial encounter: Secondary | ICD-10-CM | POA: Diagnosis not present
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
## 2019-08-15 ENCOUNTER — Telehealth: Payer: Self-pay | Admitting: Cardiovascular Disease

## 2019-08-15 NOTE — Telephone Encounter (Signed)
Needing refill on diltiazem (CARDIZEM CD) 300 MG 24 hr capsule [id number]  Sent to [facility] on [address]

## 2019-08-16 MED ORDER — DILTIAZEM HCL ER COATED BEADS 300 MG PO CP24: 300.0000 mg | ORAL_CAPSULE | Freq: Every morning | ORAL | 3 refills | Status: DC

## 2019-08-16 NOTE — Telephone Encounter (Signed)
Refill sent.

## 2019-08-22 ENCOUNTER — Other Ambulatory Visit: Payer: Self-pay

## 2019-08-22 ENCOUNTER — Encounter (HOSPITAL_COMMUNITY): Payer: Self-pay | Admitting: Psychiatry

## 2019-08-22 ENCOUNTER — Ambulatory Visit (INDEPENDENT_AMBULATORY_CARE_PROVIDER_SITE_OTHER): Payer: Medicaid Other | Admitting: Psychiatry

## 2019-08-22 DIAGNOSIS — F4310 Post-traumatic stress disorder, unspecified: Secondary | ICD-10-CM | POA: Diagnosis not present

## 2019-08-22 MED ORDER — DIVALPROEX SODIUM ER 500 MG PO TB24: 1500.0000 mg | ORAL_TABLET | Freq: Two times a day (BID) | ORAL | 0 refills | Status: DC

## 2019-08-23 ENCOUNTER — Ambulatory Visit (HOSPITAL_COMMUNITY)
Admission: RE | Admit: 2019-08-23 | Discharge: 2019-08-23 | Disposition: A | Discharge status: Home / Self Care | Payer: Medicare Other | Source: Ambulatory Visit | Attending: Orthopedic Surgery | Admitting: Orthopedic Surgery

## 2019-08-23 ENCOUNTER — Other Ambulatory Visit: Payer: Self-pay

## 2019-08-23 DIAGNOSIS — M25562 Pain in left knee: Secondary | ICD-10-CM | POA: Insufficient documentation

## 2019-08-23 IMAGING — MR MR KNEE*L* W/O CM
4 of 7 series · 14 of 40 positions shown · non-contrast
Comparison: None.

CLINICAL DATA: Posterior left knee pain for 3 weeks.

EXAM:
MRI OF THE LEFT KNEE WITHOUT CONTRAST
TECHNIQUE: Multiplanar, multisequence MR imaging of the knee was performed. No
intravenous contrast was administered.

[Series 3: T2 fat-sat · axial · 4.0mm · 0.23mm/px · z∈[-48,+46]mm · 3 of 24 slices shown]
[im 5/24]
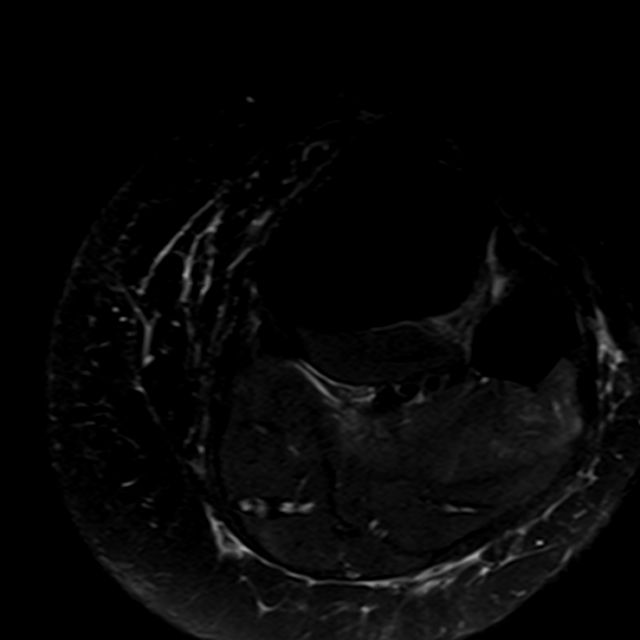
[im 14/24]
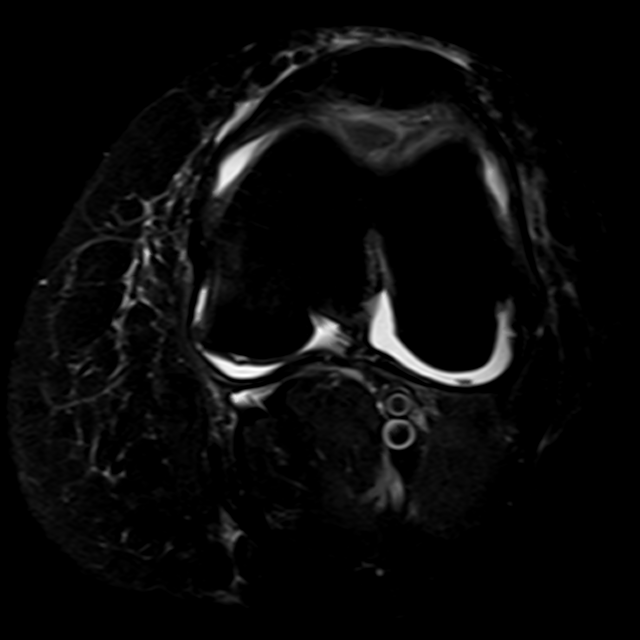
[im 24/24]
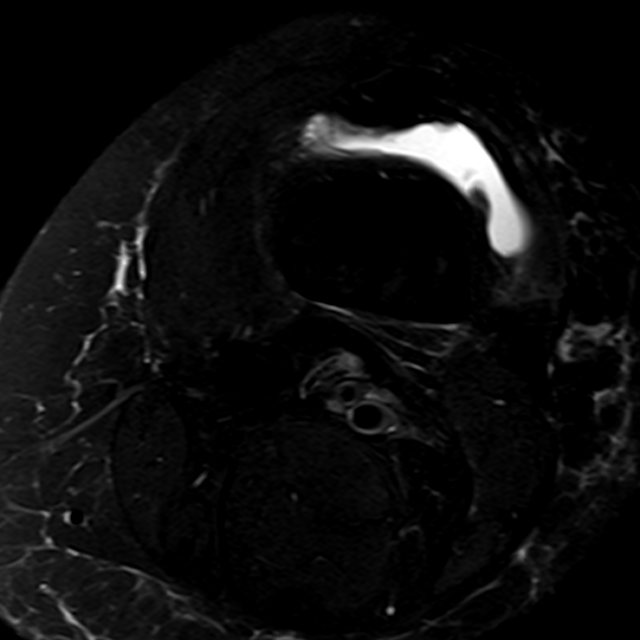

[Series 6: PD fat-sat · coronal · 3.0mm · 0.21mm/px · 5 of 36 slices shown (1 of 3)]
[im 1/36]
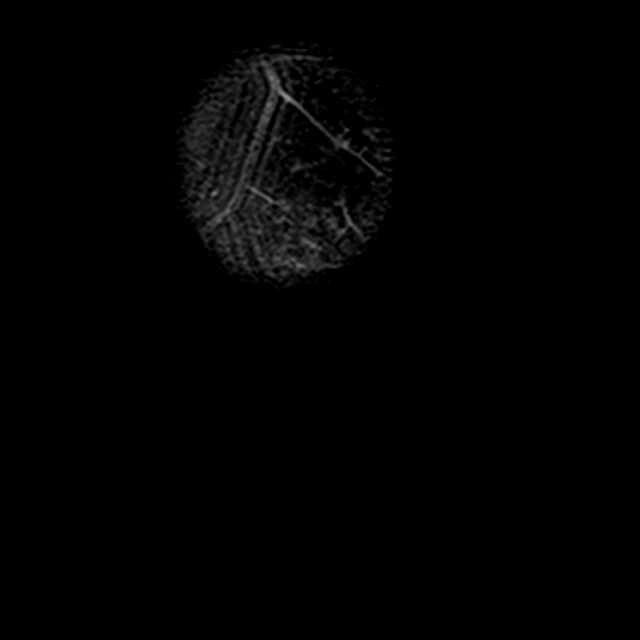
[im 6/36]
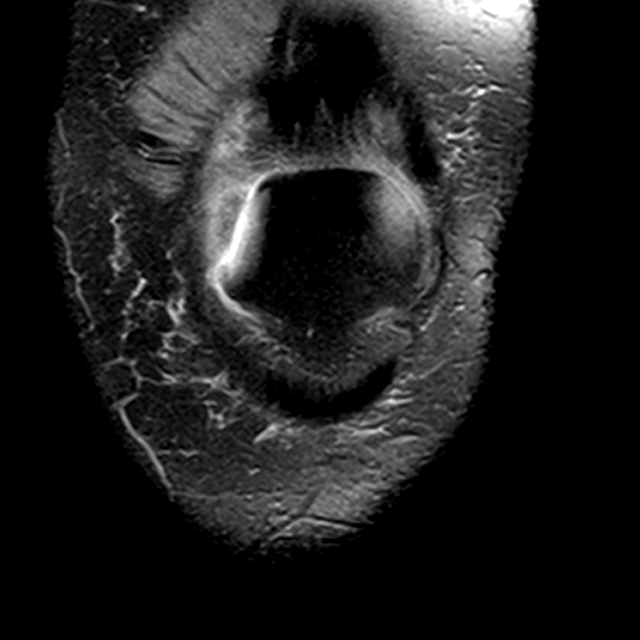
[im 12/36]
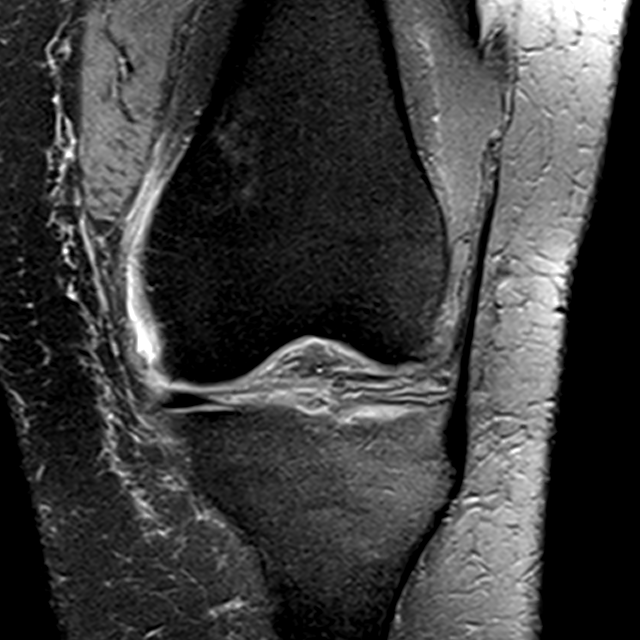
[im 18/36]
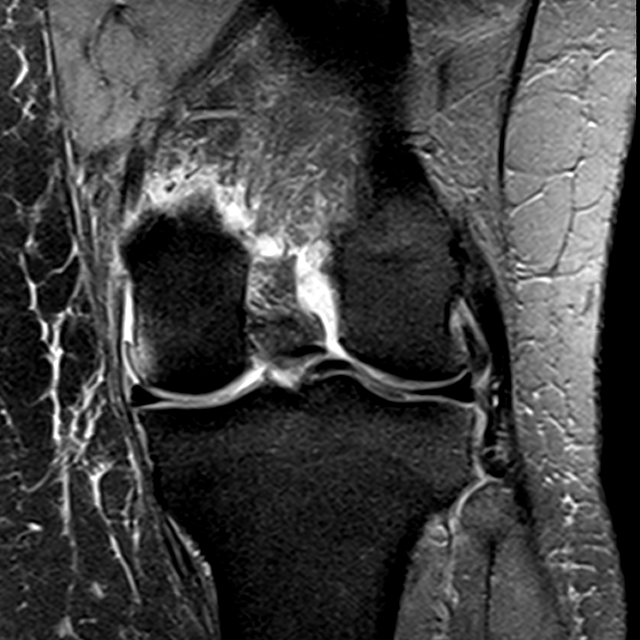
[im 30/36]
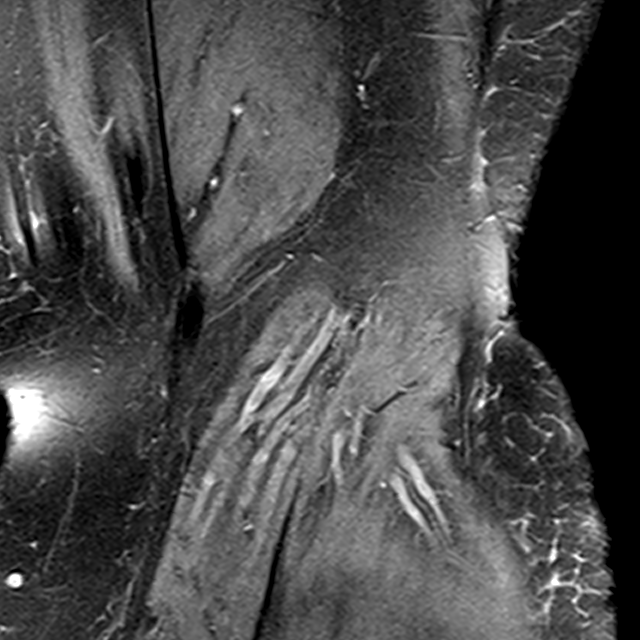

[Series 7: PD fat-sat · sagittal · 3.0mm · 0.23mm/px · 3 of 29 slices shown (2 of 3)]
[im 6/29]
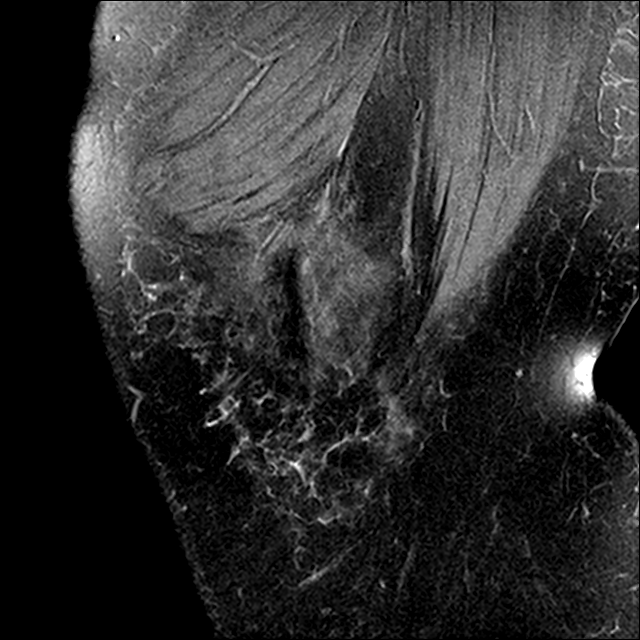
[im 17/29]
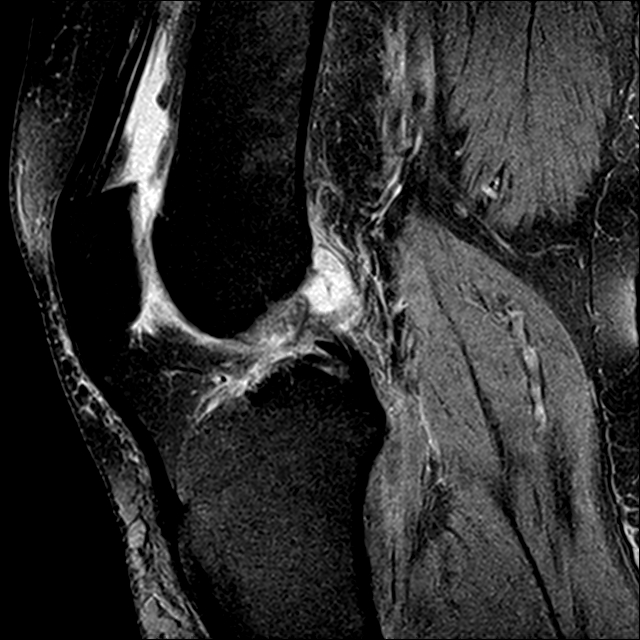
[im 29/29]
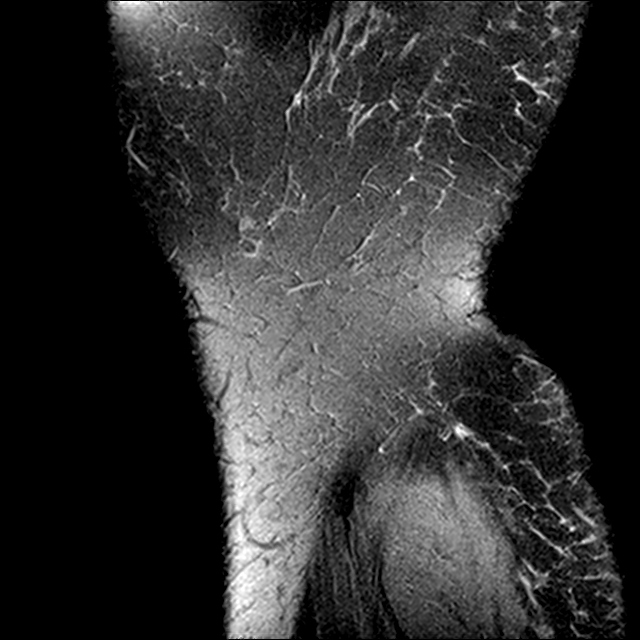

[Series 9: PD fat-sat · coronal · 2.0mm · 0.21mm/px · 3 of 15 slices shown (3 of 3)]
[im 1/15]
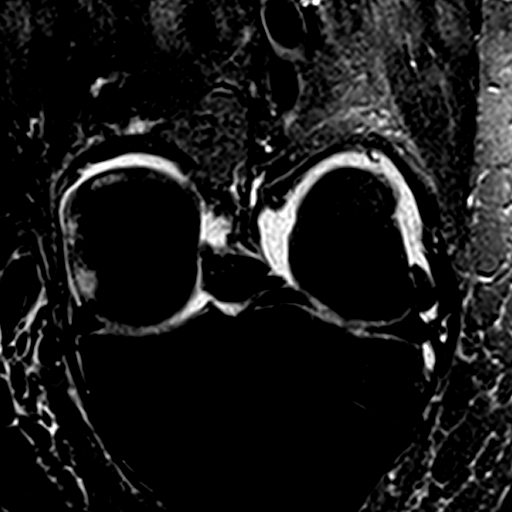
[im 8/15]
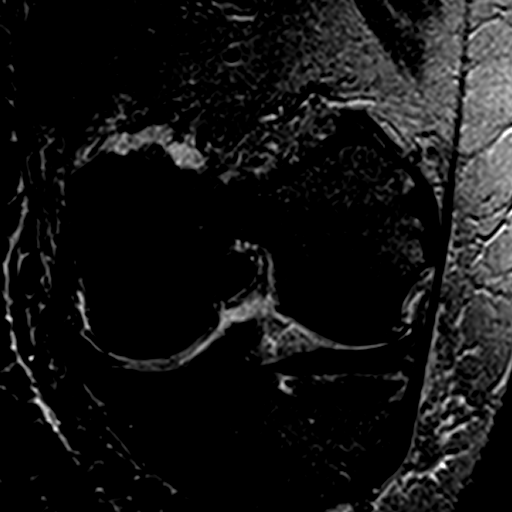
[im 15/15]
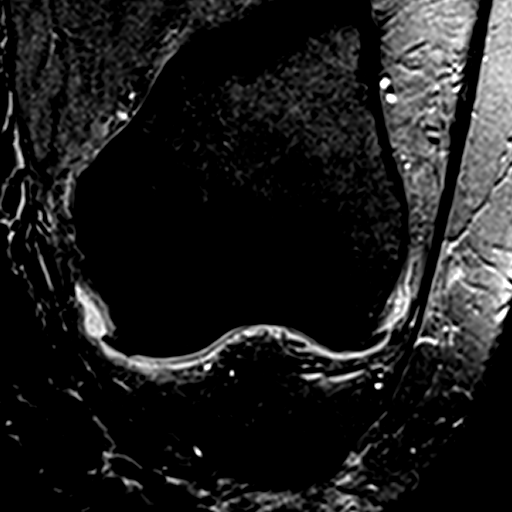

[14 of 40 positions shown; findings below may reference images not displayed]

FINDINGS: MENISCI

Medial meniscus:  Intact.

Lateral meniscus:  Intact.

LIGAMENTS

Cruciates:  Complete chronic ACL tear.  Intact PCL.

Collaterals: Medial collateral ligament is intact. Lateral
collateral ligament complex is intact.

CARTILAGE

Patellofemoral: High-grade partial-thickness cartilage loss with
areas of full-thickness cartilage loss of the lateral patellar
facet. Partial-thickness cartilage loss throughout the remainder of
the patellofemoral compartment.

Medial: Mild partial-thickness cartilage loss of the medial
femorotibial compartment.

Lateral: Partial-thickness cartilage loss of the lateral
femorotibial compartment.

Joint: Large joint effusion. Normal Hoffa's fat. No plical
thickening.

Popliteal Fossa:  Tiny Baker's cyst.  Intact popliteus tendon.

Extensor Mechanism: Intact quadriceps tendon. Intact patellar
tendon. Intact medial patellar retinaculum. Intact lateral patellar
retinaculum. Intact MPFL.

Bones:  No acute osseous abnormality.  No aggressive osseous lesion.

Other: No fluid collection or hematoma.  Muscles are normal.
IMPRESSION: 1. Complete chronic ACL tear.
2. Tricompartmental cartilage abnormalities as described above.

## 2019-08-27 DIAGNOSIS — F319 Bipolar disorder, unspecified: Secondary | ICD-10-CM | POA: Diagnosis not present

## 2019-08-27 DIAGNOSIS — I10 Essential (primary) hypertension: Secondary | ICD-10-CM | POA: Diagnosis not present

## 2019-08-27 DIAGNOSIS — F419 Anxiety disorder, unspecified: Secondary | ICD-10-CM | POA: Diagnosis not present

## 2019-08-27 DIAGNOSIS — G9009 Other idiopathic peripheral autonomic neuropathy: Secondary | ICD-10-CM | POA: Diagnosis not present

## 2019-08-27 DIAGNOSIS — R944 Abnormal results of kidney function studies: Secondary | ICD-10-CM | POA: Diagnosis not present

## 2019-08-27 DIAGNOSIS — G259 Extrapyramidal and movement disorder, unspecified: Secondary | ICD-10-CM | POA: Diagnosis not present

## 2019-08-27 DIAGNOSIS — R0602 Shortness of breath: Secondary | ICD-10-CM | POA: Diagnosis not present

## 2019-08-27 DIAGNOSIS — Z6841 Body Mass Index (BMI) 40.0 and over, adult: Secondary | ICD-10-CM | POA: Diagnosis not present

## 2019-08-27 DIAGNOSIS — J45909 Unspecified asthma, uncomplicated: Secondary | ICD-10-CM | POA: Diagnosis not present

## 2019-08-27 DIAGNOSIS — I509 Heart failure, unspecified: Secondary | ICD-10-CM | POA: Diagnosis not present

## 2019-08-27 DIAGNOSIS — E1140 Type 2 diabetes mellitus with diabetic neuropathy, unspecified: Secondary | ICD-10-CM | POA: Diagnosis not present

## 2019-08-27 DIAGNOSIS — M170 Bilateral primary osteoarthritis of knee: Secondary | ICD-10-CM | POA: Diagnosis not present

## 2019-08-27 DIAGNOSIS — D803 Selective deficiency of immunoglobulin G [IgG] subclasses: Secondary | ICD-10-CM | POA: Diagnosis not present

## 2019-08-28 ENCOUNTER — Encounter (HOSPITAL_COMMUNITY): Payer: Self-pay | Admitting: Emergency Medicine

## 2019-08-28 ENCOUNTER — Other Ambulatory Visit: Payer: Self-pay

## 2019-08-28 ENCOUNTER — Emergency Department (HOSPITAL_COMMUNITY): Payer: Medicare Other

## 2019-08-28 ENCOUNTER — Inpatient Hospital Stay (HOSPITAL_COMMUNITY)
Admission: EM | Admit: 2019-08-28 | Discharge: 2019-08-31 | DRG: 287 | Disposition: A | Discharge status: Home / Self Care | Payer: Medicare Other | Attending: Interventional Cardiology | Admitting: Interventional Cardiology

## 2019-08-28 DIAGNOSIS — Z6841 Body Mass Index (BMI) 40.0 and over, adult: Secondary | ICD-10-CM

## 2019-08-28 DIAGNOSIS — Z79890 Hormone replacement therapy (postmenopausal): Secondary | ICD-10-CM

## 2019-08-28 DIAGNOSIS — N179 Acute kidney failure, unspecified: Secondary | ICD-10-CM | POA: Diagnosis present

## 2019-08-28 DIAGNOSIS — I5033 Acute on chronic diastolic (congestive) heart failure: Secondary | ICD-10-CM | POA: Diagnosis present

## 2019-08-28 DIAGNOSIS — I10 Essential (primary) hypertension: Secondary | ICD-10-CM | POA: Diagnosis not present

## 2019-08-28 DIAGNOSIS — I493 Ventricular premature depolarization: Secondary | ICD-10-CM | POA: Diagnosis present

## 2019-08-28 DIAGNOSIS — R079 Chest pain, unspecified: Secondary | ICD-10-CM | POA: Diagnosis present

## 2019-08-28 DIAGNOSIS — R7989 Other specified abnormal findings of blood chemistry: Secondary | ICD-10-CM

## 2019-08-28 DIAGNOSIS — E875 Hyperkalemia: Secondary | ICD-10-CM | POA: Diagnosis present

## 2019-08-28 DIAGNOSIS — M549 Dorsalgia, unspecified: Secondary | ICD-10-CM | POA: Diagnosis present

## 2019-08-28 DIAGNOSIS — E785 Hyperlipidemia, unspecified: Secondary | ICD-10-CM | POA: Diagnosis present

## 2019-08-28 DIAGNOSIS — F4310 Post-traumatic stress disorder, unspecified: Secondary | ICD-10-CM | POA: Diagnosis present

## 2019-08-28 DIAGNOSIS — F319 Bipolar disorder, unspecified: Secondary | ICD-10-CM | POA: Diagnosis present

## 2019-08-28 DIAGNOSIS — R0789 Other chest pain: Secondary | ICD-10-CM | POA: Diagnosis not present

## 2019-08-28 DIAGNOSIS — R778 Other specified abnormalities of plasma proteins: Secondary | ICD-10-CM

## 2019-08-28 DIAGNOSIS — I480 Paroxysmal atrial fibrillation: Secondary | ICD-10-CM | POA: Diagnosis present

## 2019-08-28 DIAGNOSIS — R0600 Dyspnea, unspecified: Secondary | ICD-10-CM | POA: Diagnosis not present

## 2019-08-28 DIAGNOSIS — G4733 Obstructive sleep apnea (adult) (pediatric): Secondary | ICD-10-CM | POA: Diagnosis present

## 2019-08-28 DIAGNOSIS — Z8249 Family history of ischemic heart disease and other diseases of the circulatory system: Secondary | ICD-10-CM

## 2019-08-28 DIAGNOSIS — G4730 Sleep apnea, unspecified: Secondary | ICD-10-CM | POA: Diagnosis present

## 2019-08-28 DIAGNOSIS — T501X5A Adverse effect of loop [high-ceiling] diuretics, initial encounter: Secondary | ICD-10-CM | POA: Diagnosis present

## 2019-08-28 DIAGNOSIS — R9431 Abnormal electrocardiogram [ECG] [EKG]: Secondary | ICD-10-CM

## 2019-08-28 DIAGNOSIS — Z8673 Personal history of transient ischemic attack (TIA), and cerebral infarction without residual deficits: Secondary | ICD-10-CM

## 2019-08-28 DIAGNOSIS — Z811 Family history of alcohol abuse and dependence: Secondary | ICD-10-CM

## 2019-08-28 DIAGNOSIS — Z7951 Long term (current) use of inhaled steroids: Secondary | ICD-10-CM

## 2019-08-28 DIAGNOSIS — F410 Panic disorder [episodic paroxysmal anxiety] without agoraphobia: Secondary | ICD-10-CM | POA: Diagnosis not present

## 2019-08-28 DIAGNOSIS — Z8379 Family history of other diseases of the digestive system: Secondary | ICD-10-CM

## 2019-08-28 DIAGNOSIS — Z794 Long term (current) use of insulin: Secondary | ICD-10-CM

## 2019-08-28 DIAGNOSIS — J209 Acute bronchitis, unspecified: Secondary | ICD-10-CM | POA: Diagnosis present

## 2019-08-28 DIAGNOSIS — G629 Polyneuropathy, unspecified: Secondary | ICD-10-CM | POA: Diagnosis present

## 2019-08-28 DIAGNOSIS — E1165 Type 2 diabetes mellitus with hyperglycemia: Secondary | ICD-10-CM | POA: Diagnosis not present

## 2019-08-28 DIAGNOSIS — Z87891 Personal history of nicotine dependence: Secondary | ICD-10-CM

## 2019-08-28 DIAGNOSIS — E039 Hypothyroidism, unspecified: Secondary | ICD-10-CM | POA: Diagnosis present

## 2019-08-28 DIAGNOSIS — J4540 Moderate persistent asthma, uncomplicated: Secondary | ICD-10-CM | POA: Diagnosis present

## 2019-08-28 DIAGNOSIS — G4700 Insomnia, unspecified: Secondary | ICD-10-CM | POA: Diagnosis present

## 2019-08-28 DIAGNOSIS — R0602 Shortness of breath: Secondary | ICD-10-CM | POA: Diagnosis not present

## 2019-08-28 DIAGNOSIS — I5032 Chronic diastolic (congestive) heart failure: Secondary | ICD-10-CM | POA: Diagnosis present

## 2019-08-28 DIAGNOSIS — R0609 Other forms of dyspnea: Secondary | ICD-10-CM

## 2019-08-28 DIAGNOSIS — Z20828 Contact with and (suspected) exposure to other viral communicable diseases: Secondary | ICD-10-CM | POA: Diagnosis not present

## 2019-08-28 DIAGNOSIS — Z9989 Dependence on other enabling machines and devices: Secondary | ICD-10-CM | POA: Diagnosis not present

## 2019-08-28 DIAGNOSIS — I110 Hypertensive heart disease with heart failure: Principal | ICD-10-CM | POA: Diagnosis present

## 2019-08-28 DIAGNOSIS — Z888 Allergy status to other drugs, medicaments and biological substances status: Secondary | ICD-10-CM

## 2019-08-28 DIAGNOSIS — I700 Atherosclerosis of aorta: Secondary | ICD-10-CM | POA: Diagnosis present

## 2019-08-28 DIAGNOSIS — Z560 Unemployment, unspecified: Secondary | ICD-10-CM

## 2019-08-28 DIAGNOSIS — T380X5A Adverse effect of glucocorticoids and synthetic analogues, initial encounter: Secondary | ICD-10-CM | POA: Diagnosis present

## 2019-08-28 DIAGNOSIS — Z7901 Long term (current) use of anticoagulants: Secondary | ICD-10-CM

## 2019-08-28 DIAGNOSIS — Z823 Family history of stroke: Secondary | ICD-10-CM

## 2019-08-28 DIAGNOSIS — I472 Ventricular tachycardia: Secondary | ICD-10-CM | POA: Diagnosis present

## 2019-08-28 DIAGNOSIS — Z79899 Other long term (current) drug therapy: Secondary | ICD-10-CM

## 2019-08-28 DIAGNOSIS — F419 Anxiety disorder, unspecified: Secondary | ICD-10-CM | POA: Diagnosis present

## 2019-08-28 DIAGNOSIS — R072 Precordial pain: Secondary | ICD-10-CM | POA: Diagnosis not present

## 2019-08-28 DIAGNOSIS — Z841 Family history of disorders of kidney and ureter: Secondary | ICD-10-CM

## 2019-08-28 HISTORY — DX: Personal history of (corrected) congenital malformations of heart and circulatory system: Z87.74

## 2019-08-28 LAB — CBC WITH DIFFERENTIAL/PLATELET
Abs Immature Granulocytes: 0.05 10*3/uL (ref 0.00–0.07)
Basophils Absolute: 0 10*3/uL (ref 0.0–0.1)
Basophils Relative: 0 %
Eosinophils Absolute: 0 10*3/uL (ref 0.0–0.5)
Eosinophils Relative: 0 %
HCT: 40.4 % (ref 36.0–46.0)
Hemoglobin: 13.5 g/dL (ref 12.0–15.0)
Immature Granulocytes: 1 %
Lymphocytes Relative: 34 %
Lymphs Abs: 2.3 10*3/uL (ref 0.7–4.0)
MCH: 33.3 pg (ref 26.0–34.0)
MCHC: 33.4 g/dL (ref 30.0–36.0)
MCV: 99.5 fL (ref 80.0–100.0)
Monocytes Absolute: 0.4 10*3/uL (ref 0.1–1.0)
Monocytes Relative: 7 %
Neutro Abs: 3.8 10*3/uL (ref 1.7–7.7)
Neutrophils Relative %: 58 %
Platelets: 163 10*3/uL (ref 150–400)
RBC: 4.06 MIL/uL (ref 3.87–5.11)
RDW: 12.4 % (ref 11.5–15.5)
WBC: 6.6 10*3/uL (ref 4.0–10.5)
nRBC: 0 % (ref 0.0–0.2)

## 2019-08-28 LAB — COMPREHENSIVE METABOLIC PANEL
ALT: 13 U/L (ref 0–44)
AST: 16 U/L (ref 15–41)
Albumin: 3.8 g/dL (ref 3.5–5.0)
Alkaline Phosphatase: 52 U/L (ref 38–126)
Anion gap: 11 (ref 5–15)
BUN: 25 mg/dL — ABNORMAL HIGH (ref 6–20)
CO2: 25 mmol/L (ref 22–32)
Calcium: 9.2 mg/dL (ref 8.9–10.3)
Chloride: 107 mmol/L (ref 98–111)
Creatinine, Ser: 1.17 mg/dL — ABNORMAL HIGH (ref 0.44–1.00)
GFR calc Af Amer: 60 mL/min — ABNORMAL LOW (ref >60)
GFR calc non Af Amer: 52 mL/min — ABNORMAL LOW (ref >60)
Glucose, Bld: 142 mg/dL — ABNORMAL HIGH (ref 70–99)
Potassium: 4.4 mmol/L (ref 3.5–5.1)
Sodium: 143 mmol/L (ref 135–145)
Total Bilirubin: 0.8 mg/dL (ref 0.3–1.2)
Total Protein: 7.5 g/dL (ref 6.5–8.1)

## 2019-08-28 LAB — BRAIN NATRIURETIC PEPTIDE: B Natriuretic Peptide: 715 pg/mL — ABNORMAL HIGH (ref 0.0–100.0)

## 2019-08-28 LAB — GLUCOSE, CAPILLARY: Glucose-Capillary: 110 mg/dL — ABNORMAL HIGH (ref 70–99)

## 2019-08-28 LAB — MAGNESIUM: Magnesium: 2 mg/dL (ref 1.7–2.4)

## 2019-08-28 LAB — TROPONIN I (HIGH SENSITIVITY): Troponin I (High Sensitivity): 114 ng/L (ref <18)

## 2019-08-28 LAB — INFLUENZA PANEL BY PCR (TYPE A & B)
Influenza A By PCR: NEGATIVE
Influenza B By PCR: NEGATIVE

## 2019-08-28 IMAGING — DX DG CHEST 1V PORT
1 series · 1 of 1 positions shown · non-contrast
Comparison: Chest radiograph dated [DATE].

CLINICAL DATA: 58-year-old female with shortness of breath.

EXAM:
PORTABLE CHEST 1 VIEW

[chest ap]
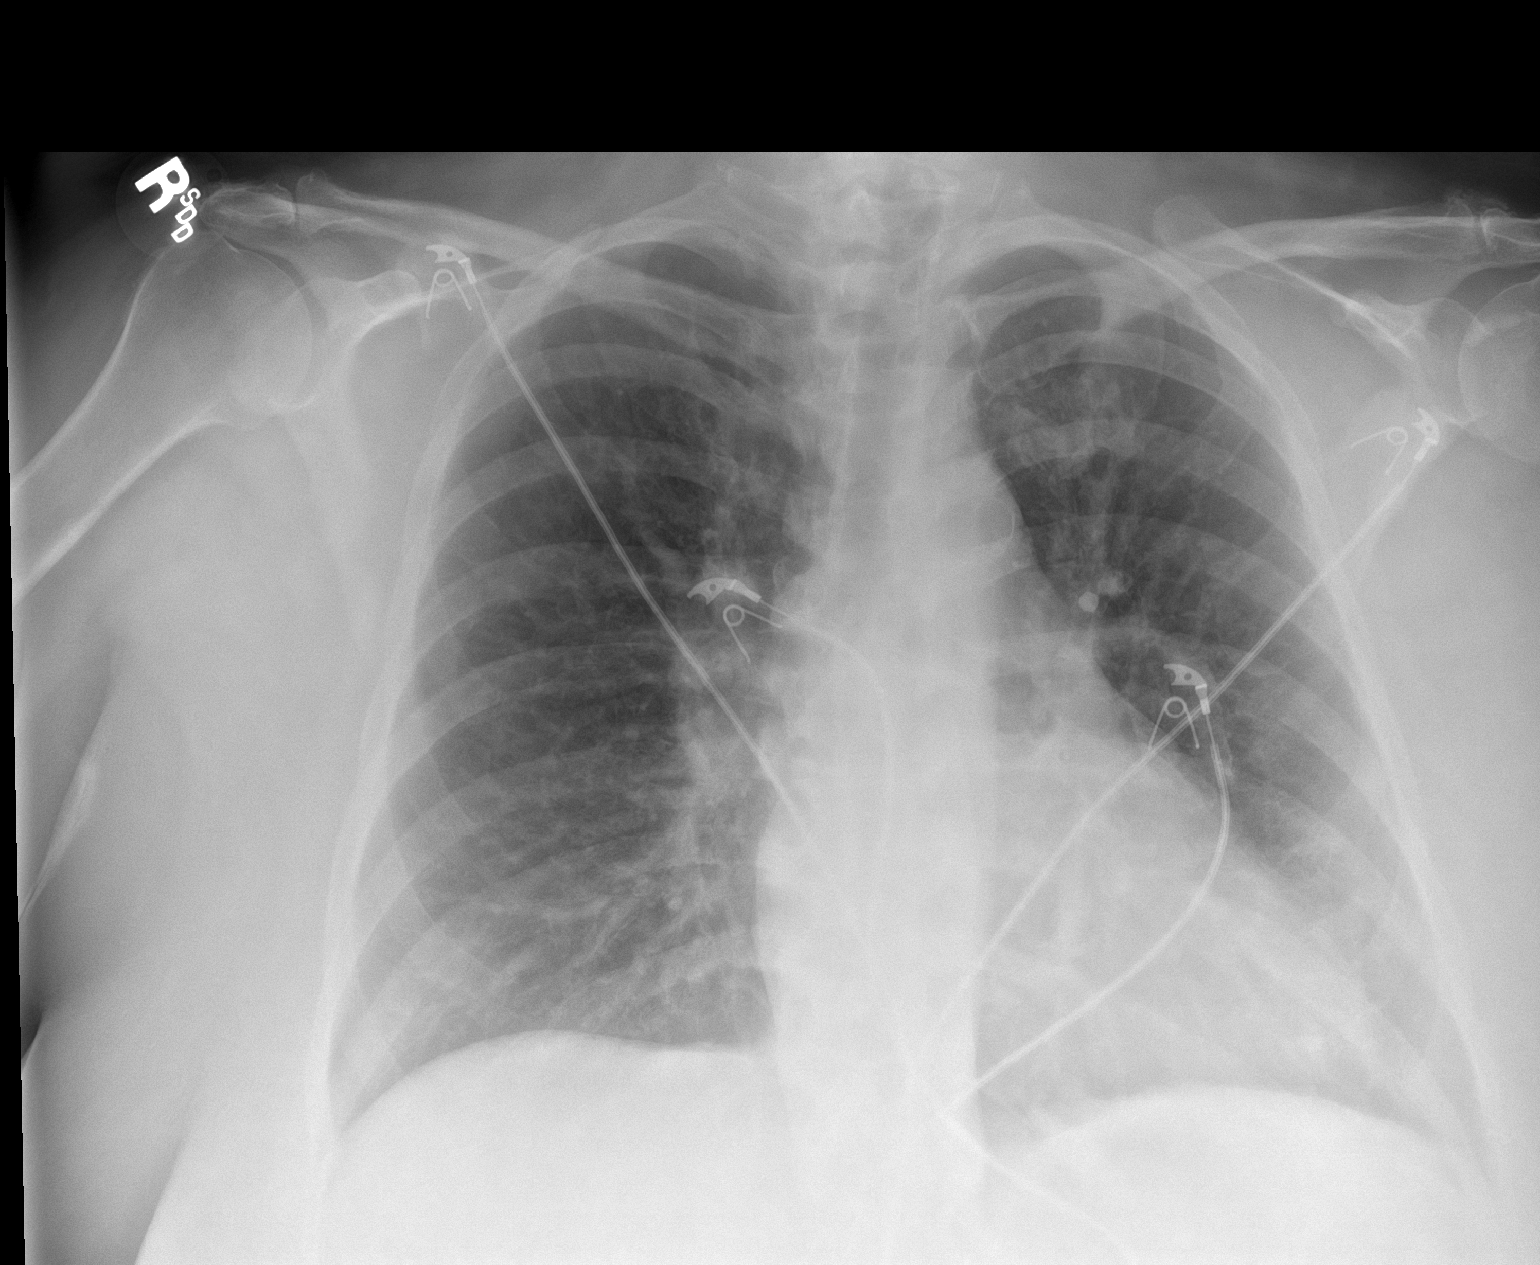

[1 of 1 positions shown; findings below may reference images not displayed]

FINDINGS: Mild diffuse interstitial prominence similar to prior radiograph. No
focal consolidation, pleural effusion, pneumothorax. The cardiac
silhouette is within normal limits. Atherosclerotic calcification of
the aortic arch. No acute osseous pathology.
IMPRESSION: 1. No acute cardiopulmonary process.
2. Stable mild interstitial prominence.

## 2019-08-28 MED ORDER — ACETAMINOPHEN 325 MG PO TABS: 650.0000 mg | ORAL_TABLET | Freq: Four times a day (QID) | ORAL | Status: DC | PRN

## 2019-08-28 MED ORDER — INSULIN ASPART 100 UNIT/ML ~~LOC~~ SOLN
0.0000 [IU] | Freq: Three times a day (TID) | SUBCUTANEOUS | Status: DC
  Administered 2019-08-29: 11 [IU] via SUBCUTANEOUS
  Administered 2019-08-29: 5 [IU] via SUBCUTANEOUS
  Administered 2019-08-29: 8 [IU] via SUBCUTANEOUS
  Administered 2019-08-30 (×2): 15 [IU] via SUBCUTANEOUS
  Administered 2019-08-30: 5 [IU] via SUBCUTANEOUS
  Administered 2019-08-31 (×2): 15 [IU] via SUBCUTANEOUS

## 2019-08-28 MED ORDER — ATORVASTATIN CALCIUM 40 MG PO TABS
40.0000 mg | ORAL_TABLET | Freq: Every day | ORAL | Status: DC
  Administered 2019-08-28 – 2019-08-30 (×3): 40 mg via ORAL
  Filled 2019-08-28 (×3): qty 1

## 2019-08-28 MED ORDER — ALBUTEROL SULFATE 108 (90 BASE) MCG/ACT IN AEPB: 1.0000 | INHALATION_SPRAY | Freq: Four times a day (QID) | RESPIRATORY_TRACT | Status: DC | PRN

## 2019-08-28 MED ORDER — ALBUTEROL SULFATE (2.5 MG/3ML) 0.083% IN NEBU: 2.5000 mg | INHALATION_SOLUTION | Freq: Four times a day (QID) | RESPIRATORY_TRACT | Status: DC | PRN

## 2019-08-28 MED ORDER — DILTIAZEM HCL ER COATED BEADS 180 MG PO CP24
300.0000 mg | ORAL_CAPSULE | Freq: Every morning | ORAL | Status: DC
  Administered 2019-08-29 – 2019-08-31 (×3): 300 mg via ORAL
  Filled 2019-08-28 (×3): qty 1

## 2019-08-28 MED ORDER — MELATONIN 3 MG PO TABS
3.0000 mg | ORAL_TABLET | Freq: Every day | ORAL | Status: DC
  Administered 2019-08-28 – 2019-08-30 (×3): 3 mg via ORAL
  Filled 2019-08-28 (×4): qty 1

## 2019-08-28 MED ORDER — METHYLPREDNISOLONE SODIUM SUCC 40 MG IJ SOLR
40.0000 mg | Freq: Four times a day (QID) | INTRAMUSCULAR | Status: DC
  Administered 2019-08-28 – 2019-08-31 (×11): 40 mg via INTRAVENOUS
  Filled 2019-08-28 (×11): qty 1

## 2019-08-28 MED ORDER — INSULIN ASPART 100 UNIT/ML ~~LOC~~ SOLN
0.0000 [IU] | Freq: Every day | SUBCUTANEOUS | Status: DC
  Administered 2019-08-30: 4 [IU] via SUBCUTANEOUS

## 2019-08-28 MED ORDER — MOMETASONE FURO-FORMOTEROL FUM 200-5 MCG/ACT IN AERO
2.0000 | INHALATION_SPRAY | Freq: Two times a day (BID) | RESPIRATORY_TRACT | Status: DC
  Administered 2019-08-29: 2 via RESPIRATORY_TRACT
  Filled 2019-08-28 (×2): qty 8.8

## 2019-08-28 MED ORDER — DOXYCYCLINE HYCLATE 100 MG PO TABS
100.0000 mg | ORAL_TABLET | Freq: Two times a day (BID) | ORAL | Status: DC
  Administered 2019-08-28 – 2019-08-31 (×6): 100 mg via ORAL
  Filled 2019-08-28 (×7): qty 1

## 2019-08-28 MED ORDER — LAMOTRIGINE 25 MG PO TABS
25.0000 mg | ORAL_TABLET | Freq: Every day | ORAL | Status: DC
  Administered 2019-08-29: 25 mg via ORAL
  Filled 2019-08-28 (×6): qty 1

## 2019-08-28 MED ORDER — FUROSEMIDE 10 MG/ML IJ SOLN
40.0000 mg | Freq: Once | INTRAMUSCULAR | Status: AC
  Administered 2019-08-28: 40 mg via INTRAVENOUS
  Filled 2019-08-28: qty 4

## 2019-08-28 MED ORDER — FENTANYL CITRATE (PF) 100 MCG/2ML IJ SOLN
50.0000 ug | Freq: Once | INTRAMUSCULAR | Status: AC
  Administered 2019-08-28: 50 ug via INTRAVENOUS
  Filled 2019-08-28: qty 2

## 2019-08-28 MED ORDER — ASPIRIN 81 MG PO CHEW
324.0000 mg | CHEWABLE_TABLET | Freq: Once | ORAL | Status: AC
  Administered 2019-08-28: 19:00:00 324 mg via ORAL
  Filled 2019-08-28: qty 4

## 2019-08-28 MED ORDER — ONDANSETRON HCL 4 MG/2ML IJ SOLN
4.0000 mg | Freq: Once | INTRAMUSCULAR | Status: AC
  Administered 2019-08-28: 4 mg via INTRAVENOUS
  Filled 2019-08-28: qty 2

## 2019-08-28 MED ORDER — HYDROCODONE-ACETAMINOPHEN 5-325 MG PO TABS
1.0000 | ORAL_TABLET | Freq: Four times a day (QID) | ORAL | Status: DC | PRN
  Administered 2019-08-28 – 2019-08-29 (×2): 1 via ORAL
  Filled 2019-08-28 (×2): qty 1

## 2019-08-28 MED ORDER — KETOROLAC TROMETHAMINE 30 MG/ML IJ SOLN
30.0000 mg | Freq: Once | INTRAMUSCULAR | Status: AC
  Administered 2019-08-28: 30 mg via INTRAVENOUS
  Filled 2019-08-28: qty 1

## 2019-08-28 MED ORDER — APIXABAN 5 MG PO TABS
5.0000 mg | ORAL_TABLET | Freq: Two times a day (BID) | ORAL | Status: DC
  Administered 2019-08-28: 22:00:00 5 mg via ORAL
  Filled 2019-08-28: qty 1

## 2019-08-28 MED ORDER — SODIUM CHLORIDE 0.9% FLUSH
3.0000 mL | Freq: Two times a day (BID) | INTRAVENOUS | Status: DC
  Administered 2019-08-28 – 2019-08-30 (×4): 3 mL via INTRAVENOUS

## 2019-08-28 MED ORDER — GABAPENTIN 300 MG PO CAPS
600.0000 mg | ORAL_CAPSULE | Freq: Three times a day (TID) | ORAL | Status: DC
  Administered 2019-08-28 – 2019-08-31 (×8): 600 mg via ORAL
  Filled 2019-08-28 (×8): qty 2

## 2019-08-28 MED ORDER — INSULIN GLARGINE 100 UNIT/ML ~~LOC~~ SOLN
45.0000 [IU] | Freq: Every day | SUBCUTANEOUS | Status: DC
  Administered 2019-08-28 – 2019-08-30 (×3): 45 [IU] via SUBCUTANEOUS
  Filled 2019-08-28 (×6): qty 0.45

## 2019-08-28 MED ORDER — LEVOTHYROXINE SODIUM 112 MCG PO TABS
112.0000 ug | ORAL_TABLET | Freq: Every day | ORAL | Status: DC
  Administered 2019-08-29 – 2019-08-31 (×3): 112 ug via ORAL
  Filled 2019-08-28 (×3): qty 1

## 2019-08-28 MED ORDER — HYDROXYZINE HCL 25 MG PO TABS
25.0000 mg | ORAL_TABLET | Freq: Four times a day (QID) | ORAL | Status: DC | PRN
  Administered 2019-08-29 – 2019-08-31 (×6): 25 mg via ORAL
  Filled 2019-08-28 (×6): qty 1

## 2019-08-28 NOTE — H&P (Signed)
History and Physical    PLEASE NOTE THAT DRAGON DICTATION SOFTWARE WAS USED IN THE CONSTRUCTION OF THIS NOTE.   Laura [name] TMH:[id number] DOB: 03/24/1961 DOA: 08/28/2019  PCP: [name], MD Patient coming from: Home  I have personally briefly reviewed patient's old medical records in [place]  Chief Complaint: Shortness of breath  HPI: Laura [name] is a 58 y.o. female with medical history significant for chronic diastolic heart failure with echocardiogram in November 2018 showing severe concentric LVH with EF 65 to 70%, paroxysmal atrial fibrillation chronically anticoagulated on Eliquis, type 2 diabetes mellitus, obstructive sleep apnea on home nocturnal CPAP, moderate persistent asthma, HTN, who is admitted to [facility] on 08/28/2019 with suspected acute on chronic diastolic heart failure after resenting from home to [facility] emergency department complaining of shortness of breath.   The patient reports that she was in her normal state of health until 08/26/2019 at which time she reports developing subjective fever, chills, generalized myalgias, as well as a productive cough.  Shortly thereafter she developed wheezing as well as 2 to 3 days of progressive shortness of breath associated with orthopnea and development of edema in the bilateral lower extremities.  Confirms that her outpatient diuretic regimen consists of Lasix 40 mg p.o. every morning as well as Lasix 20 mg daily in the afternoon.  Denies any recent adjustments to this outpatient diuretic regimen.  She also notes intermittent, sharp, left-sided chest pain over the last 2 days, which occurs with cough as well as deep inspiration.  She also reports that the sharp chest discomfort worsens when lying supine, and improves when leaning forward, and notes reproducibility of the chest discomfort with palpation over the left portion of the anterior chest wall.  Denies any associated hemoptysis, lower extremity  erythema, or calf tenderness.  Denies any recent traveling, trauma, surgical procedures, and denies any personal history of DVT/PE.  She conveys outstanding compliance with all her home medications, including Eliquis for thromboembolic prophylaxis in the setting of a history of paroxysmal atrial fibrillation.  Denies any associated palpitations, diaphoresis, dizziness, or presyncope.   Denies any known recent COVID-19 exposures.  Reports intermittent nausea over the last 2 to 3 days, resulting in 2-3 episodes of nonbloody, nonbilious emesis.  Denies any associated sore throat, abdominal pain, diarrhea, rash, or dysuria.   In the setting of the above symptoms, the patient presented to her PCP on 08/27/2019, at which time she underwent COVID-19 testing, which was found to be negative.  PCP reportedly suspected acute bronchitis with bronchospasm due to viral etiology, and prescribed Levaquin, of which the patient reports that she has taken 1 interval dose.   Per chart review, most recent echocardiogram occurred on 08/11/2018 and showed severe concentric LVH, LVEF 65 to 70%, no focal wall motion abnormalities, no significant valvular pathology, but was deemed to be not technically sufficient in order to evaluate diastolic function.    ED Course: Vital signs in the emergency department this evening were notable for the following: Temperature max 99; heart rate 78-88; blood pressure ranged from 104/61-120 8/59; respiratory rate 14-21; and oxygen saturation 97 to 100% on room air.  Labs were notable for the following: CMP notable for sodium 143, bicarbonate 25, BUN 25 compared to most recent BUN data point of 32 when checked on 07/14/2019, creatinine 1.17 down from most recent prior creatinine of 1.47 on 07/14/2019, and liver enzymes were found to be within normal limits.  CBC notable for white blood  cell count of 6600 with 58% neutrophils, hemoglobin 13.5.  BMP 715, up from most recent prior value of 68 on  08/10/2018.  High-sensitivity troponin I 114, with no prior values for point comparison.  Urinalysis showed no white blood cells.  Repeat COVID-19 nasopharyngeal swab was performed in the ED this evening, with results currently pending.   Chest x-ray showed mild diffuse interstitial edema similar to prior chest x-ray from November 2019, but showed no evidence of airspace opacity, pleural effusion, or pneumothorax.  EKG showed atrial fibrillation with ventricular rate 90, QTc 513 ms; T wave inversions in the inferior, anterior, and lateral leads, which appear to be new relative to EKG from December 2019, and no evidence of ST changes.  The patient's case, including EKG findings, were discussed with the on-call cardiologist, Dr. Harl Bowie, suspected underlying infection as original precipitating factor, with potential resultant myocarditis versus pericarditis in the setting of positional chest discomfort favored relative to plaque rupture.  He recommended further evaluation for underlying infection as well as trending of troponin, with plans for cardiology to evaluate the patient in the morning.   While in the ED, the patient received Toradol 30 mg IV x1, fentanyl 50 mcg IV x1, Zofran 4 mg IV x1, aspirin 324 mg p.o. x1.  She also received Lasix 40 mg IV x1, with ensuing urine output reported to be approximately 1 L following which the patient reports that her shortness of breath appears to be improving.  Subsequently, the patient was admitted to the med telemetry floor for further evaluation and management of acute on chronic diastolic heart failure in the setting of suspected precipitating viral induced acute asthma exacerbation.    Review of Systems: As per HPI otherwise 10 point review of systems negative.   Past Medical History:  Diagnosis Date   Asthma    Atrial fibrillation ([place])    Bipolar disorder ([place])    Bronchiectasis ([place])    CHF (congestive heart failure) ([place])    Complication of  anesthesia    "my Dr in [place] told me I had an allergic reaction to the combination of the pain meds and the anesthesia" she does not remember what happened but "I eneded up in the ICU for 5 days".   Hypogammaglobulinemia ([place])    Hypothyroid    IgE deficiency (HCC)    Neuropathy    Osteoarthritis    PTSD (post-traumatic stress disorder)    Recurrent sinusitis    Short-term memory loss    Sleep apnea    TIA (transient ischemic attack)    Tremors of nervous system    seeing PCP for this.   Type 2 diabetes mellitus (HCC)    Urticaria    Wound infection     Past Surgical History:  Procedure Laterality Date   ASD [place] N/A 08/29/2018   Procedure: CARDIOVERSION;  Surgeon: Arnoldo Lenis, MD;  Location: [facility];  Service: Endoscopy;  Laterality: N/A;   [place]   NASAL SINUS SURGERY  2017    Social History:  reports that she quit smoking about 12 years ago. Her smoking use included cigarettes. She has a 17.00 pack-year smoking history. She has never used smokeless tobacco. She reports previous alcohol use. She reports previous drug use.   Allergies  Allergen Reactions   Ativan [Lorazepam] Hives   Phenergan [Promethazine Hcl] Hives    Family  History  Problem Relation Age of Onset   Hypertension Mother    Stroke Mother    Kidney disease Mother    Heart attack Mother    Alcohol abuse Mother    Kidney disease Sister    Heart disease Brother    Stroke Maternal Aunt    Heart disease Maternal Aunt    Hypertension Sister    Bipolar disorder Sister    Thyroid disease Sister    Thyroid disease Daughter    Thyroid disease Daughter    Hashimoto's thyroiditis Daughter    Celiac disease Daughter    Allergic rhinitis Daughter    Thyroid disease Daughter    Asthma Neg Hx     Prior to Admission medications   Medication Sig Start Date  End Date Taking? Authorizing Provider  acetaminophen (TYLENOL) 325 MG tablet Take 2 tablets (650 mg total) by mouth every 6 (six) hours as needed for mild pain, fever or headache. 08/11/18   Roxan Hockey, MD  Albuterol Sulfate (PROAIR RESPICLICK) 767 (90 Base) MCG/ACT AEPB Inhale 1 puff into the lungs every 6 (six) hours as needed (for shortness of breath/wheezing).     [provider]  apixaban (ELIQUIS) 5 MG TABS tablet Take 5 mg by mouth 2 (two) times daily.    [provider]  atorvastatin (LIPITOR) 10 MG tablet Take 10 mg by mouth every evening.     [provider]  azithromycin (ZITHROMAX) 500 MG tablet Take 1 tablet (500 mg total) by mouth daily. Patient not taking: Reported on 07/14/2019 07/13/19   Chevis Pretty, FNP  diltiazem (CARDIZEM CD) 300 MG 24 hr capsule Take 1 capsule (300 mg total) by mouth every morning. 08/16/19   Herminio Commons, MD  divalproex (DEPAKOTE ER) 500 MG 24 hr tablet Take 3 tablets (1,500 mg total) by mouth 2 (two) times daily. 1000 mg in AM, 1500 mg in PM 10/10/19   Hisada, Elie Goody, MD  Dulaglutide (TRULICITY) 1.5 MC/9.4BS SOPN Inject 1.5 mg into the skin every Sunday.     [provider]  furosemide (LASIX) 20 MG tablet Take 20-40 mg by mouth See admin instructions. Take 2 tablets (37m total) every morning and 1 tablet (219mtotal)  at noon.    [provider]  gabapentin (NEURONTIN) 600 MG tablet Take 600 mg by mouth 3 (three) times daily.    [provider]  HUMALOG KWIKPEN 100 UNIT/ML KwikPen Sliding scale. 08/28/18   [provider]  hydrALAZINE (APRESOLINE) 25 MG tablet Take 25 mg by mouth 3 (three) times daily.    [provider]  hydrochlorothiazide (HYDRODIURIL) 25 MG tablet Take 25 mg by mouth every morning.     [provider]  hydrOXYzine (ATARAX/VISTARIL) 25 MG tablet Take 1 tablet (25 mg total) by mouth every 6 (six) hours as needed for anxiety or nausea. 08/11/18    EmRoxan HockeyMD  Immune Globulin, Human, (CUVITRU) 4 GM/20ML SOLN Inject 100 mLs into the skin every 14 (fourteen) days.    [provider]  insulin glargine (LANTUS) 100 UNIT/ML injection Inject 60 Units into the skin at bedtime.    [provider]  lamoTRIgine (LAMICTAL) 25 MG tablet Take 1 tablet (25 mg total) by mouth daily. 08/16/19   HiNorman ClayMD  levothyroxine (SYNTHROID, LEVOTHROID) 112 MCG tablet Take 112 mcg by mouth daily before breakfast.    [provider]  lisinopril (PRINIVIL,ZESTRIL) 40 MG tablet Take 40 mg by mouth every morning.  [provider]  Melatonin 3 MG TABS Take 3 mg by mouth at bedtime.    [provider]  metFORMIN (GLUCOPHAGE-XR) 500 MG 24 hr tablet Take 1,000 mg by mouth 2 (two) times daily.  04/11/18   [provider]  mometasone-formoterol (DULERA) 200-5 MCG/ACT AERO Inhale 2 puffs into the lungs 2 (two) times daily. 06/21/19   Valentina Shaggy, MD  ondansetron (ZOFRAN) 4 MG tablet Take 1 tablet (4 mg total) by mouth every 8 (eight) hours as needed for nausea or vomiting. 07/13/19   Chevis Pretty, FNP  Spacer/Aero-Holding Chambers (AEROCHAMBER MV) inhaler Use as instructed 10/24/18   Juanito Doom, MD  tiotropium (SPIRIVA) 18 MCG inhalation capsule Place 18 mcg into inhaler and inhale daily as needed (for breathing).     [provider]  VENTOLIN HFA 108 (90 Base) MCG/ACT inhaler INHALE 2 PUFFS INTO THE LUNGS EVERY 6 HOURS AS NEEDED FOR WHEEZING OR SHORTNESS OF BREATH 08/12/19   Valentina Shaggy, MD    Objective    Physical Exam: Vitals:   08/28/19 1431 08/28/19 1432 08/28/19 1535 08/28/19 1545  BP: (!) 128/59  (!) 125/97   Pulse: 90  78 85  Resp: 18   (!) 21  Temp: 99 F (37.2 C)     TempSrc: Oral     SpO2: 98%  97% 98%  Weight:  114.8 kg    Height:  _0  (1.626 m)      General: appears to be stated age; alert, oriented Skin: warm, dry, no rash Head:   AT/[place] Eyes:  PEARL b/l, EOMI Mouth:  Oral mucosa membranes appear moist, normal dentition Neck: supple; trachea midline Heart:  Irregular, but rate controlled; did not appreciate any M/R/G Lungs: b/l expiratory wheezing noted; bibasilar rales noted.  Abdomen: + BS; soft, ND, NT Vascular: 2+ pedal pulses b/l; 2+ radial pulses b/l Extremities: 1+ edema in the bilateral lower extremities; no muscle wasting   Labs on Admission: I have personally reviewed following labs and imaging studies  CBC: Recent Labs  Lab 08/28/19 1517  WBC 6.6  NEUTROABS 3.8  HGB 13.5  HCT 40.4  MCV 99.5  PLT 841   Basic Metabolic Panel: Recent Labs  Lab 08/28/19 1517  NA 143  K 4.4  CL 107  CO2 25  GLUCOSE 142*  BUN 25*  CREATININE 1.17*  CALCIUM 9.2   GFR: Estimated Creatinine Clearance: 65.9 mL/min (A) (by C-G formula based on SCr of 1.17 mg/dL (H)). Liver Function Tests: Recent Labs  Lab 08/28/19 1517  AST 16  ALT 13  ALKPHOS 52  BILITOT 0.8  PROT 7.5  ALBUMIN 3.8   No results for input(s): LIPASE, AMYLASE in the last 168 hours. No results for input(s): AMMONIA in the last 168 hours. Coagulation Profile: No results for input(s): INR, PROTIME in the last 168 hours. Cardiac Enzymes: No results for input(s): CKTOTAL, CKMB, CKMBINDEX, TROPONINI in the last 168 hours. BNP (last 3 results) No results for input(s): PROBNP in the last 8760 hours. HbA1C: No results for input(s): HGBA1C in the last 72 hours. CBG: No results for input(s): GLUCAP in the last 168 hours. Lipid Profile: No results for input(s): CHOL, HDL, LDLCALC, TRIG, CHOLHDL, LDLDIRECT in the last 72 hours. Thyroid Function Tests: No results for input(s): TSH, T4TOTAL, FREET4, T3FREE, THYROIDAB in the last 72 hours. Anemia Panel: No results for input(s): VITAMINB12, FOLATE, FERRITIN, TIBC, IRON, RETICCTPCT in the last 72 hours. Urine analysis:    Component Value  Date/Time   COLORURINE STRAW (A) 07/14/2019 1154    APPEARANCEUR CLEAR 07/14/2019 1154   LABSPEC 1.008 07/14/2019 1154   PHURINE 5.0 07/14/2019 1154   GLUCOSEU NEGATIVE 07/14/2019 1154   HGBUR NEGATIVE 07/14/2019 1154   BILIRUBINUR NEGATIVE 07/14/2019 [place] 07/14/2019 1154   PROTEINUR NEGATIVE 07/14/2019 1154   NITRITE NEGATIVE 07/14/2019 1154   LEUKOCYTESUR NEGATIVE 07/14/2019 1154    Radiological Exams on Admission: Dg Chest Portable 1 View  Result Date: 08/28/2019 CLINICAL DATA:  58 year old female with shortness of breath. EXAM: PORTABLE CHEST 1 VIEW COMPARISON:  Chest radiograph dated 08/10/2018. FINDINGS: Mild diffuse interstitial prominence similar to prior radiograph. No focal consolidation, pleural effusion, pneumothorax. The cardiac silhouette is within normal limits. Atherosclerotic calcification of the aortic arch. No acute osseous pathology. IMPRESSION: 1. No acute cardiopulmonary process. 2. Stable mild interstitial prominence. Electronically Signed   By: Anner Crete M.D.   On: 08/28/2019 16:13    EKG: Independently reviewed,, with result as described above.   Assessment/Plan   Haeleigh Streiff is a 58 y.o. female with medical history significant for chronic diastolic heart failure with echocardiogram in November 2018 showing severe concentric LVH with EF 65 to 70%, paroxysmal atrial fibrillation chronically anticoagulated on Eliquis, type 2 diabetes mellitus, obstructive sleep apnea on home nocturnal CPAP, moderate persistent asthma, HTN, who is admitted to [facility] on 08/28/2019 with suspected acute on chronic diastolic heart failure after resenting from home to [facility] emergency department complaining of shortness of breath.    Principal Problem:   Acute on chronic diastolic CHF (congestive heart failure) (HCC) Active Problems:   Uncontrolled type 2 diabetes mellitus with hyperglycemia (HCC)   Hypothyroidism   Essential hypertension, benign   OSA on CPAP   Anxiety   Chest pain    Shortness of breath  #) Acute on chronic diastolic heart failure: In the setting of chronic diastolic heart failure, with most recent echocardiogram performed in November 2019 with results as conferred above, the patient presents with shortness of breath associated with orthopnea, development of peripheral edema, tenfold increase in BNP, and chest x-ray showing evidence of interstitial edema.  Diagnosis appears further supported by patient's report of some improvement in short of breath following output of 1 L urine following administration of IV Lasix in the ED this evening. Given initial symptoms of subjective fever, chills, generalized myalgias, and productive cough, precipitating factors suspected to be initial lower respiratory tract infection of viral etiology, as further described below.  Of note, home diuretic regimen consists of Lasix 40 mg p.o. every morning as well as 20 mg p.o. in the afternoon, with which the patient reports outstanding compliance.  As the patient now has exhibited some soft blood pressures in response to significant diuretic response to IV Lasix, will plan to reevaluate volume status in the morning to guide additional diuretic, with plan to refrain from additional diuretic administration overnight.  Plan: Monitor strict I's and O's and daily weights.  Monitor on telemetry.  Add on serum magnesium level.  Repeat BMP in the morning, with close attention to trending of interval sodium, potassium, bicarb, and creatinine.  Reassess volume status in the morning to guide subsequent diuresis dosing.  In the setting of acute on chronic diastolic heart failure with elevated troponin, I have ordered echocardiogram to occur in the morning to evaluate interval systolic/diastolic function as well as to monitor for any interval focal wall motion abnormalities.  Anticipate cardiology to see patient in the  morning.  Work-up and management of suspected precipitating lower respiratory tract infection  due to viral etiology, as further described low.     #) Acute asthma exacerbation: In the context of a documented history of moderate persistent asthma, the patient presents with shortness of breath associated with wheezing, which appears to have been precipitated by suspected lower respiratory tract infection of viral etiology given initial symptoms of subjective fever, chills, generalized myalgias, and new productive cough.  No evidence of associated hypoxia at this time.  In the setting of associated wheezing, which appears less consistent with cardiac wheeze, will initiate Solu-Medrol as a component of management of associated bronchospasm.  Patient reports good compliance with outpatient respiratory regimen, which consists of Dulera, as needed Spiriva, and as needed albuterol inhaler.  Plan: Solu-Medrol 40 mg IV every 6 hours.  Add on serum magnesium level to labs collected in the ED, with the threshold for IV magnesium supplementation.  Continue home [place].  As needed albuterol inhaler.  While underlying viral etiology is suspected, will initiate doxycycline for atypical coverage.  Would likely have otherwise elected to proceed with a azithromycin for this purpose, however, QTC prolongation noted on presenting EKG. follow-up resolve COVID-19 nasopharyngeal swab performed in the ED this evening.  Check rapid influenza.  Check procalcitonin.  As needed supplemental oxygen in order to maintain oxygen saturations greater than or equal to 92%.     #) Atypical chest pain: Pleuritic left-sided chest discomfort that is reproducible with direct palpation of the anterior chest wall as well as associated with a strong positional component in which chest discomfort intensifies when lying supine and improves with leaning forward.  This positional component would appear to be consistent with acute pericarditis, which was considered by Dr. Harl Bowie of cardiology, per discussion above. Per discussions with Dr.  Harl Bowie, ACS is felt to be less likely, with elevated troponin more suggestive of type II supply demand mismatch in the setting of acutely decompensated heart failure as opposed to representing plaque rupture causing a type 1 process, as further described below.  Consider the possibility of acute pulmonary embolism given the pleuritic element of patient's chest pain, however this is felt to be less likely given that the patient is chronically anticoagulated on Eliquis in the setting of paroxysmal atrial fibrillation, and reports outstanding compliance with this anticoagulant.  Pleuritic component would also be consistent with suspected underlying/precipitating viral lower respiratory tract infection. no evidence of pneumothorax on presenting chest x-ray.  Differential for patient's atypical chest pain also includes musculoskeletal etiologies given the reproducibility of patient's chest discomfort with direct palpation of the anterior chest wall. S/p FD aspirin in the ED.  Plan: Check CRP and ESR.  Monitor on telemetry.  In the setting of concomitant acute on chronic diastolic heart failure, will refrain from NSAIDs, which may otherwise have been effective given differential inclusion of acute pericarditis.  Echocardiogram has been ordered to occur in the morning, as described above.  Serial troponin.  Work-up and management of suspected viral lower respiratory tract infection with ensuing acute asthma exacerbation, as further described above.  Will refrain from CTA of the chest for now.     #) Type 2 NSTEMI: Elevated presenting high-sensitivity troponin I with diffuse T wave inversions, in the absence of any ST changes, including no evidence of ST elevation, as described above.  This is all in the context of atypical pleuritic chest discomfort that is positional and reproducible with direct palpation the anterior chest wall.  Differential  favors supply demand mismatch in the setting of acutely decompensated  heart failure as opposed to representing a type I process due to plaque rupture, as further described above.  Per discussions with on-call cardiology this evening, differential also includes myocarditis versus pericarditis.  Given that type I process is clinically felt to be less likely, will refrain from initiation of heparin drip at this time, but rather treat suspected underlying acutely decompensated heart failure and further evaluating for inflammatory possibilities, as further described above.  Status post full dose aspirin administered in the ED this evening.  Plan: Work-up and management of acute on chronic diastolic heart failure, as described above.  Monitor on telemetry.  Atorvastatin 40 mg p.o. x1 now.  Check ESR and CRP.  Trend serial troponin.  Echocardiogram has been ordered for the morning, as above.     #) Paroxysmal atrial fibrillation: Is chronically anticoagulated on Eliquis for thromboembolic prophylaxis in the setting of CHA2DS2-VASc score of 6.  On extended release diltiazem as her outpatient AV nodal blocking agent.  Patient is in rate controlled atrial fibrillation at the time of this evening's presentation.  Plan: Continue home Eliquis as well as AV nodal blocking regimen.  Monitor on telemetry.  Add on serum magnesium level to labs already collected in the ED.     #) Obstructive sleep apnea: Patient reports good compliance with home nocturnal CPAP.  Plan: Continue home nocturnal CPAP during this hospitalization.     #) Type 2 diabetes mellitus: Outpatient insulin regimen consists of Lantus 60 units subcu nightly as well as sliding scale Humalog 3 times daily with meals.  Presenting blood sugar noted to be 142, although there will be increased risk of hyperglycemia in response to plan for initiation of Solu-Medrol, as further described above.  She is also on Trulicity as an outpatient, as well as Metformin.  Plan: In terms of basal insulin, will start with Lantus 45  units subcu nightly, with close monitoring of fasting a.m. blood sugar to help guide subsequent nightly dosing of Lantus.  Accu-Cheks before every meal and at bedtime with sliding scale insulin.  Will hold home Metformin and Trulicity during this hospitalization.     #) Acquired hypothyroidism: On Synthroid as an outpatient.  Plan: Continue home dose of Synthroid, and check TSH level in the morning.     #) History of essential hypertension: Outpatient antihypertensive regimen includes hydralazine, HCTZ, lisinopril, with additional potential antihypertensive contributions from Lasix as well as diltiazem.  While the patient has not been overtly hypotensive since presenting to the ED today, she has experienced some soft blood pressures following significant diuresis following dose of IV Lasix administered in the ED.  Consequently, will be conservative with subsequent antihypertensive dosing, while closely monitoring sitting blood pressure given the importance of adequate afterload reduction in the setting of acutely decompensated heart failure.  Plan: For now we will hold home hydralazine, lisinopril, and HCTZ.  Close monitoring of ensuing blood pressures via routine vital signs.    DVT prophylaxis: Continue home Eliquis. Code Status: Full code Family Communication: None Disposition Plan:  Per Rounding Team Consults called: on-call cardiology, Dr. Harl Bowie, as above.  Admission status: Inpatient; med telemetry   PLEASE NOTE THAT DRAGON DICTATION SOFTWARE WAS USED IN THE CONSTRUCTION OF THIS NOTE.   [facility] Pager [phone] From 3PM- 11PM.   Otherwise, please contact night-coverage  [email or web address] Password [facility]  08/28/2019, 5:52 PM

## 2019-08-28 NOTE — ED Provider Notes (Signed)
[facility] EMERGENCY DEPARTMENT Provider Note   CSN: [id number] Arrival date & time: 08/28/19  1415     History   Chief Complaint Chief Complaint  Patient presents with  . Shortness of Breath    HPI Laura [name] is a 58 y.o. female.     Patient with history of A. fib on Eliquis, CHF, diabetes, sleep apnea presenting with a 2-day history of shortness of breath, chest tightness and generalized body aches and chills.  States she has had shortness of breath for the past 2 days that is noticed with exertion worse with lying flat.  Difficulty walking because  with shortness of breath and fatigue. Had a brief episode of chest pain last night and fairly constant chest pain all day today. She has had a cough productive of clear yellow mucus and one episode of posttussive emesis.  She denies any documented fever.  Saw her PCP yesterday was told that she had "diminished lung sounds on the left".  Her PCP referred her to the hospital today.  Patient has had negative Covid testing last night.  Denies any leg pain or leg swelling.  States compliance with her medications. Had several episodes of vomiting earlier in the week but that seemed to improve.   Shortness of Breath Associated symptoms: chest pain, cough and vomiting   Associated symptoms: no abdominal pain and no rash     Past Medical History:  Diagnosis Date  . Asthma   . Atrial fibrillation ([place])   . Bipolar disorder ([place])   . Bronchiectasis ([place])   . CHF (congestive heart failure) ([place])   . Complication of anesthesia    "my Dr in [place] told me I had an allergic reaction to the combination of the pain meds and the anesthesia" she does not remember what happened but "I eneded up in the ICU for 5 days".  . Hypogammaglobulinemia ([place])   . Hypothyroid   . 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 Neuropathy   . Osteoarthritis   . PTSD (post-traumatic stress disorder)   . Recurrent sinusitis   . Short-term memory loss   . Sleep apnea   .  TIA (transient ischemic attack)   . Tremors of nervous system    seeing PCP for this.  . Type 2 diabetes mellitus ([place])   . Urticaria   . Wound infection     Patient Active Problem List   Diagnosis Date Noted  . Atypical chest pain 08/10/2018  . Bipolar disorder ([place]) 08/10/2018  . Headache 08/10/2018  . H/O atrial flutter 08/10/2018  . OSA on CPAP 08/10/2018  . Anxiety 08/10/2018  . Uncontrolled type 2 diabetes mellitus with hyperglycemia ([place]) 03/13/2018  . Hypothyroidism 03/13/2018  . Essential hypertension, benign 03/13/2018  . Mixed hyperlipidemia 03/13/2018  . Hypogammaglobulinemia ([place]) 02/06/2018  . Non-allergic rhinitis 02/06/2018  . Severe persistent asthma, uncomplicated [id number]  . Pulmonary nodules 02/06/2018  . Bronchiectasis without complication ([place]) [id number]  . DM type 2 causing vascular disease ([place]) 02/06/2018    Past Surgical History:  Procedure Laterality Date  . 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 CARDIAC SURGERY    . CARDIOVERSION    . CARDIOVERSION N/A 08/29/2018   Procedure: CARDIOVERSION;  Surgeon: Arnoldo Lenis, MD;  Location: [facility] ENDO SU[facility];  Service: Endoscopy;  Laterality: N/A;  . [place]  . NASAL SINUS SURGERY  2017     OB History    Gravida  3   Para  3  Term      Preterm  3   AB      Living  3     SAB      TAB      Ectopic      Multiple      Live Births               Home Medications    Prior to Admission medications   Medication Sig Start Date End Date Taking? Authorizing Provider  acetaminophen (TYLENOL) 325 MG tablet Take 2 tablets (650 mg total) by mouth every 6 (six) hours as needed for mild pain, fever or headache. 08/11/18   Roxan Hockey, MD  Albuterol Sulfate (PROAIR RESPICLICK) [id number] (90 Base) MCG/ACT AEPB Inhale 1 puff into the lungs every 6 (six) hours as needed (for shortness of breath/wheezing).     [provider]  apixaban (ELIQUIS) 5 MG TABS tablet Take 5 mg by  mouth 2 (two) times daily.    [provider]  atorvastatin (LIPITOR) 10 MG tablet Take 10 mg by mouth every evening.     [provider]  azithromycin (ZITHROMAX) 500 MG tablet Take 1 tablet (500 mg total) by mouth daily. Patient not taking: Reported on 07/14/2019 07/13/19   Chevis Pretty, FNP  diltiazem (CARDIZEM CD) 300 MG 24 hr capsule Take 1 capsule (300 mg total) by mouth every morning. 08/16/19   Herminio Commons, MD  divalproex (DEPAKOTE ER) 500 MG 24 hr tablet Take 3 tablets (1,500 mg total) by mouth 2 (two) times daily. 1000 mg in AM, 1500 mg in PM 10/10/19   Hisada, Elie Goody, MD  Dulaglutide (TRULICITY) 1.5 0000000 SOPN Inject 1.5 mg into the skin every Sunday.     [provider]  furosemide (LASIX) 20 MG tablet Take 20-40 mg by mouth See admin instructions. Take 2 tablets (40mg  total) every morning and 1 tablet (20mg  total)  at noon.    [provider]  gabapentin (NEURONTIN) 600 MG tablet Take 600 mg by mouth 3 (three) times daily.    [provider]  HUMALOG KWIKPEN 100 UNIT/ML KwikPen Sliding scale. 08/28/18   [provider]  hydrALAZINE (APRESOLINE) 25 MG tablet Take 25 mg by mouth 3 (three) times daily.    [provider]  hydrochlorothiazide (HYDRODIURIL) 25 MG tablet Take 25 mg by mouth every morning.     [provider]  hydrOXYzine (ATARAX/VISTARIL) 25 MG tablet Take 1 tablet (25 mg total) by mouth every 6 (six) hours as needed for anxiety or nausea. 08/11/18   Roxan Hockey, MD  Immune Globulin, Human, (CUVITRU) 4 GM/20ML SOLN Inject 100 mLs into the skin every 14 (fourteen) days.    [provider]  insulin glargine (LANTUS) 100 UNIT/ML injection Inject 60 Units into the skin at bedtime.    [provider]  lamoTRIgine (LAMICTAL) 25 MG tablet Take 1 tablet (25 mg total) by mouth daily. 08/16/19   [name], MD  levothyroxine (SYNTHROID, LEVOTHROID) 112 MCG tablet Take 112 mcg  by mouth daily before breakfast.    [provider]  lisinopril (PRINIVIL,ZESTRIL) 40 MG tablet Take 40 mg by mouth every morning.     [provider]  Melatonin 3 MG TABS Take 3 mg by mouth at bedtime.    [provider]  metFORMIN (GLUCOPHAGE-XR) 500 MG 24 hr tablet Take 1,000 mg by mouth 2 (two) times daily.  04/11/18   [provider]  mometasone-formoterol (DULERA) 200-5 MCG/ACT AERO Inhale 2  puffs into the lungs 2 (two) times daily. 06/21/19   Valentina Shaggy, MD  ondansetron (ZOFRAN) 4 MG tablet Take 1 tablet (4 mg total) by mouth every 8 (eight) hours as needed for nausea or vomiting. 07/13/19   Chevis Pretty, FNP  Spacer/Aero-Holding Chambers (AEROCHAMBER MV) inhaler Use as instructed 10/24/18   Juanito Doom, MD  tiotropium (SPIRIVA) 18 MCG inhalation capsule Place 18 mcg into inhaler and inhale daily as needed (for breathing).     [provider]  VENTOLIN HFA 108 (90 Base) MCG/ACT inhaler INHALE 2 PUFFS INTO THE LUNGS EVERY 6 HOURS AS NEEDED FOR WHEEZING OR SHORTNESS OF BREATH 08/12/19   Valentina Shaggy, MD    Family History Family History  Problem Relation Age of Onset  . Hypertension Mother   . Stroke Mother   . Kidney disease Mother   . Heart attack Mother   . Alcohol abuse Mother   . Kidney disease Sister   . Heart disease Brother   . Stroke Maternal Aunt   . Heart disease Maternal Aunt   . Hypertension Sister   . Bipolar disorder Sister   . Thyroid disease Sister   . Thyroid disease Daughter   . Thyroid disease Daughter   . Hashimoto's thyroiditis Daughter   . Celiac disease Daughter   . Allergic rhinitis Daughter   . Thyroid disease Daughter   . Asthma Neg Hx     Social History Social History   Tobacco Use  . Smoking status: Former Smoker    Packs/day: 1.00    Years: 17.00    Pack years: 17.00    Types: Cigarettes    Quit date: 2008    Years since quitting: 12.8  . Smokeless tobacco: Never  Used  Substance Use Topics  . Alcohol use: Not Currently  . Drug use: Not Currently     Allergies   Ativan [lorazepam] and Phenergan [promethazine hcl]   Review of Systems Review of Systems  Constitutional: Positive for activity change, appetite change and fatigue.  Eyes: Negative for visual disturbance.  Respiratory: Positive for cough, chest tightness and shortness of breath.   Cardiovascular: Positive for chest pain. Negative for leg swelling.  Gastrointestinal: Positive for nausea and vomiting. Negative for abdominal pain.  Genitourinary: Negative for dysuria and hematuria.  Musculoskeletal: Positive for arthralgias and myalgias.  Skin: Negative for rash.  Neurological: Positive for weakness. Negative for dizziness and light-headedness.   all other systems are negative except as noted in the HPI and PMH.     Physical Exam Updated Vital Signs BP (!) 128/59 (BP Location: Right Arm)   Pulse 90   Temp 99 F (37.2 C) (Oral)   Resp 18   Ht 5\' 4"  (1.626 m)   Wt 114.8 kg   SpO2 98%   BMI 43.43 kg/m   Physical Exam Vitals signs and nursing note reviewed.  Constitutional:      General: She is not in acute distress.    Appearance: She is well-developed. She is obese.     Comments: anxious generalized tremor Anxious appearing  HENT:     Head: Normocephalic and atraumatic.     Mouth/Throat:     Pharynx: No oropharyngeal exudate.  Eyes:     Conjunctiva/sclera: Conjunctivae normal.     Pupils: Pupils are equal, round, and reactive to light.  Neck:     Musculoskeletal: Normal range of motion and neck supple.     Comments: No meningismus. Cardiovascular:  Rate and Rhythm: Normal rate. Rhythm irregular.     Heart sounds: Normal heart sounds. No murmur.  Pulmonary:     Effort: Pulmonary effort is normal. No respiratory distress.     Breath sounds: Rales present.     Comments: Bibasilar crackles Abdominal:     Palpations: Abdomen is soft.     Tenderness: There is  no abdominal tenderness. There is no guarding or rebound.  Musculoskeletal: Normal range of motion.        General: No tenderness.  Skin:    General: Skin is warm.  Neurological:     Mental Status: She is alert and oriented to person, place, and time.     Cranial Nerves: No cranial nerve deficit.     Motor: No abnormal muscle tone.     Coordination: Coordination normal.     Comments: No ataxia on finger to nose bilaterally. No pronator drift. 5/5 strength throughout. CN 2-12 intact.Equal grip strength. Sensation intact.   Psychiatric:        Behavior: Behavior normal.      ED Treatments / Results  Labs (all labs ordered are listed, but only abnormal results are displayed) Labs Reviewed  COMPREHENSIVE METABOLIC PANEL - Abnormal; Notable for the following components:      Result Value   Glucose, Bld 142 (*)    BUN 25 (*)    Creatinine, Ser 1.17 (*)    GFR calc non Af Amer 52 (*)    GFR calc Af Amer 60 (*)    All other components within normal limits  BRAIN NATRIURETIC PEPTIDE - Abnormal; Notable for the following components:   B Natriuretic Peptide 715.0 (*)    All other components within normal limits  GLUCOSE, CAPILLARY - Abnormal; Notable for the following components:   Glucose-Capillary 110 (*)    All other components within normal limits  TROPONIN I (HIGH SENSITIVITY) - Abnormal; Notable for the following components:   Troponin I (High Sensitivity) 114 (*)    All other components within normal limits  SARS CORONAVIRUS 2 (TAT 6-24 HRS)  CBC WITH DIFFERENTIAL/PLATELET  MAGNESIUM  INFLUENZA PANEL BY PCR (TYPE A & B)  HIV ANTIBODY (ROUTINE TESTING W REFLEX)  MAGNESIUM  BASIC METABOLIC PANEL  CBC WITH DIFFERENTIAL/PLATELET  HEMOGLOBIN A1C  C-REACTIVE PROTEIN  SEDIMENTATION RATE  CK TOTAL AND CKMB (NOT AT [facility])  TROPONIN I (HIGH SENSITIVITY)    EKG EKG Interpretation  Date/Time:  Wednesday August 28 2019 14:42:32 EST Ventricular Rate:  90 PR Interval:    QRS  Duration: 102 QT Interval:  420 QTC Calculation: 513 R Axis:   92 Text Interpretation: Atrial fibrillation Rightward axis ST & T wave abnormality, consider inferior ischemia ST & T wave abnormality, consider anterolateral ischemia Prolonged QT Abnormal ECG Confirmed by Nat Christen [phone]) on 08/28/2019 2:46:39 PM   Radiology Dg Chest Portable 1 View  Result Date: 08/28/2019 CLINICAL DATA:  58 year old female with shortness of breath. EXAM: PORTABLE CHEST 1 VIEW COMPARISON:  Chest radiograph dated 08/10/2018. FINDINGS: Mild diffuse interstitial prominence similar to prior radiograph. No focal consolidation, pleural effusion, pneumothorax. The cardiac silhouette is within normal limits. Atherosclerotic calcification of the aortic arch. No acute osseous pathology. IMPRESSION: 1. No acute cardiopulmonary process. 2. Stable mild interstitial prominence. Electronically Signed   By: Anner Crete M.D.   On: 08/28/2019 16:13    Procedures Ultrasound ED Echo  Date/Time: 08/28/2019 5:44 PM Performed by: Ezequiel Essex, MD Authorized by: Ezequiel Essex, MD   Procedure details:  Indications: chest pain and dyspnea     Views: subxiphoid and parasternal long axis view     Images: archived     Limitations:  Body habitus and acoustic shadowing Findings:    Pericardium: no pericardial effusion     LV Function: normal (>50% EF)   Impression:    Impression: normal    .Critical Care Performed by: Ezequiel Essex, MD Authorized by: Ezequiel Essex, MD   Critical care provider statement:    Critical care time (minutes):  35   Critical care was necessary to treat or prevent imminent or life-threatening deterioration of the following conditions:  Cardiac failure   Critical care was time spent personally by me on the following activities:  Discussions with consultants, evaluation of patient's response to treatment, examination of patient, ordering and performing treatments and interventions,  ordering and review of laboratory studies, ordering and review of radiographic studies, pulse oximetry, re-evaluation of patient's condition, obtaining history from patient or surrogate and review of old charts   (including critical care time)  Medications Ordered in ED Medications  acetaminophen (TYLENOL) tablet 650 mg (has no administration in time range)  diltiazem (CARDIZEM CD) 24 hr capsule 300 mg (has no administration in time range)  hydrOXYzine (ATARAX/VISTARIL) tablet 25 mg (has no administration in time range)  levothyroxine (SYNTHROID) tablet 112 mcg (has no administration in time range)  apixaban (ELIQUIS) tablet 5 mg (5 mg Oral Given 08/28/19 2156)  Melatonin TABS 3 mg (3 mg Oral Given 08/28/19 2156)  gabapentin (NEURONTIN) capsule 600 mg (600 mg Oral Given 08/28/19 2156)  lamoTRIgine (LAMICTAL) tablet 25 mg (has no administration in time range)  mometasone-formoterol (DULERA) 200-5 MCG/ACT inhaler 2 puff (2 puffs Inhalation Not Given 08/28/19 2300)  sodium chloride flush (NS) 0.9 % injection 3 mL (3 mLs Intravenous Given 08/28/19 2157)  HYDROcodone-acetaminophen (NORCO/VICODIN) 5-325 MG per tablet 1 tablet (1 tablet Oral Given 08/28/19 2156)  methylPREDNISolone sodium succinate (SOLU-MEDROL) 40 mg/mL injection 40 mg (40 mg Intravenous Given 08/28/19 2156)  atorvastatin (LIPITOR) tablet 40 mg (40 mg Oral Given 08/28/19 2156)  insulin glargine (LANTUS) injection 45 Units (45 Units Subcutaneous Given 08/28/19 2249)  insulin aspart (novoLOG) injection 0-15 Units (has no administration in time range)  insulin aspart (novoLOG) injection 0-5 Units (has no administration in time range)  doxycycline (VIBRA-TABS) tablet 100 mg (100 mg Oral Given 08/28/19 2157)  albuterol (PROVENTIL) (2.5 MG/3ML) 0.083% nebulizer solution 2.5 mg (has no administration in time range)  ondansetron (ZOFRAN) injection 4 mg (4 mg Intravenous Given 08/28/19 1615)  furosemide (LASIX) injection 40 mg (40 mg  Intravenous Given 08/28/19 1617)  fentaNYL (SUBLIMAZE) injection 50 mcg (50 mcg Intravenous Given 08/28/19 1616)  ketorolac (TORADOL) 30 MG/ML injection 30 mg (30 mg Intravenous Given 08/28/19 1909)  aspirin chewable tablet 324 mg (324 mg Oral Given 08/28/19 1907)     Initial Impression / Assessment and Plan / ED Course  I have reviewed the triage vital signs and the nursing notes.  Pertinent labs & imaging results that were available during my care of the patient were reviewed by me and considered in my medical decision making (see chart for details).       Patient with shortness of pain, fatigue, dyspnea exertion, chest tightness.  EKG shows atrial fibrillation with new T wave inversions inferiorly laterally.  Discussed with cardiology Dr. Harl Bowie.  Patient does have some constitutional symptoms as well including fatigue, nausea, vomiting, diarrhea and chills.  Negative Covid swab yesterday.  Troponin elevated at  114.  Chest x-ray shows mild interstitial edema.  IV Lasix given.  EKG changes as above.ASA given and lasix given Afib rate controlled.  Patient denies any missed doses of her Eliquis.  Low suspicion for pulmonary embolism.  ACS considered in setting of EKG changes. Also consider myo/pericarditis with recent aches and vomiting and fever.  No pericardial effusion seen on [place].  No further chest pain in the ED. Admission d/w Dr. Velia Meyer.   Laura [name] was evaluated in Emergency Department on 08/28/2019 for the symptoms described in the history of present illness. She was evaluated in the context of the global COVID-19 pandemic, which necessitated consideration that the patient might be at risk for infection with the SARS-CoV-2 virus that causes COVID-19. Institutional protocols and algorithms that pertain to the evaluation of patients at risk for COVID-19 are in a state of rapid change based on information released by regulatory bodies including the CDC and federal and state  organizations. These policies and algorithms were followed during the patient's care in the ED.    Final Clinical Impressions(s) / ED Diagnoses   Final diagnoses:  Dyspnea on exertion  Abnormal EKG  Elevated troponin    ED Discharge Orders    None       Halil Rentz, Annie Main, MD 08/29/19 0109

## 2019-08-28 NOTE — ED Triage Notes (Signed)
Patient reports SOB with mild exertion, CP, generalized not feeling well. Had COVID and flu testing last night, both negative.

## 2019-08-28 NOTE — Progress Notes (Signed)
Pt has an order for CPAP QHS, COVID results still pending at this time.  RT unable to start CPAP at this time.  RT will continue to monitor.

## 2019-08-28 NOTE — Progress Notes (Signed)
Patient discussed with ER staff very early in workup prior to any laboratory or imaging data resulted. EKG shows afib with diffuse TWIs. At this time nonspecific findings, follow troponins along with basic labs. From ER history has had cough, body aches, SOB. F/u COVID results. Further cardiac recs pending completion of initial workup.   Carlyle Dolly MD

## 2019-08-28 NOTE — ED Notes (Signed)
Date and time results received: 08/28/19 1724 (use smartphrase ".now" to insert current time)  Test: troponin Critical Value: 114  Name of Provider Notified: rancour  Orders Received? Or Actions Taken?: Orders Received - See Orders for details

## 2019-08-29 ENCOUNTER — Encounter (HOSPITAL_COMMUNITY): Payer: Self-pay | Admitting: Student

## 2019-08-29 ENCOUNTER — Inpatient Hospital Stay (HOSPITAL_COMMUNITY): Payer: Medicare Other

## 2019-08-29 DIAGNOSIS — E875 Hyperkalemia: Secondary | ICD-10-CM

## 2019-08-29 DIAGNOSIS — I5033 Acute on chronic diastolic (congestive) heart failure: Secondary | ICD-10-CM | POA: Diagnosis present

## 2019-08-29 DIAGNOSIS — R0602 Shortness of breath: Secondary | ICD-10-CM | POA: Diagnosis present

## 2019-08-29 DIAGNOSIS — R9431 Abnormal electrocardiogram [ECG] [EKG]: Secondary | ICD-10-CM

## 2019-08-29 DIAGNOSIS — I5032 Chronic diastolic (congestive) heart failure: Secondary | ICD-10-CM | POA: Diagnosis present

## 2019-08-29 DIAGNOSIS — I480 Paroxysmal atrial fibrillation: Secondary | ICD-10-CM

## 2019-08-29 DIAGNOSIS — I10 Essential (primary) hypertension: Secondary | ICD-10-CM

## 2019-08-29 LAB — LIPID PANEL
Cholesterol: 137 mg/dL (ref 0–200)
HDL: 52 mg/dL (ref >40)
LDL Cholesterol: 65 mg/dL (ref 0–99)
Total CHOL/HDL Ratio: 2.6 RATIO
Triglycerides: 101 mg/dL (ref <150)
VLDL: 20 mg/dL (ref 0–40)

## 2019-08-29 LAB — CBC WITH DIFFERENTIAL/PLATELET
Abs Immature Granulocytes: 0.04 10*3/uL (ref 0.00–0.07)
Basophils Absolute: 0 10*3/uL (ref 0.0–0.1)
Basophils Relative: 0 %
Eosinophils Absolute: 0 10*3/uL (ref 0.0–0.5)
Eosinophils Relative: 0 %
HCT: 40 % (ref 36.0–46.0)
Hemoglobin: 13 g/dL (ref 12.0–15.0)
Immature Granulocytes: 1 %
Lymphocytes Relative: 21 %
Lymphs Abs: 0.7 10*3/uL (ref 0.7–4.0)
MCH: 32.7 pg (ref 26.0–34.0)
MCHC: 32.5 g/dL (ref 30.0–36.0)
MCV: 100.5 fL — ABNORMAL HIGH (ref 80.0–100.0)
Monocytes Absolute: 0.1 10*3/uL (ref 0.1–1.0)
Monocytes Relative: 2 %
Neutro Abs: 2.7 10*3/uL (ref 1.7–7.7)
Neutrophils Relative %: 76 %
Platelets: 149 10*3/uL — ABNORMAL LOW (ref 150–400)
RBC: 3.98 MIL/uL (ref 3.87–5.11)
RDW: 12.4 % (ref 11.5–15.5)
WBC: 3.6 10*3/uL — ABNORMAL LOW (ref 4.0–10.5)
nRBC: 0 % (ref 0.0–0.2)

## 2019-08-29 LAB — BASIC METABOLIC PANEL
Anion gap: 13 (ref 5–15)
BUN: 37 mg/dL — ABNORMAL HIGH (ref 6–20)
CO2: 24 mmol/L (ref 22–32)
Calcium: 9 mg/dL (ref 8.9–10.3)
Chloride: 101 mmol/L (ref 98–111)
Creatinine, Ser: 1.38 mg/dL — ABNORMAL HIGH (ref 0.44–1.00)
GFR calc Af Amer: 49 mL/min — ABNORMAL LOW (ref >60)
GFR calc non Af Amer: 42 mL/min — ABNORMAL LOW (ref >60)
Glucose, Bld: 234 mg/dL — ABNORMAL HIGH (ref 70–99)
Potassium: 5.8 mmol/L — ABNORMAL HIGH (ref 3.5–5.1)
Sodium: 138 mmol/L (ref 135–145)

## 2019-08-29 LAB — GLUCOSE, CAPILLARY
Glucose-Capillary: 222 mg/dL — ABNORMAL HIGH (ref 70–99)
Glucose-Capillary: 275 mg/dL — ABNORMAL HIGH (ref 70–99)
Glucose-Capillary: 338 mg/dL — ABNORMAL HIGH (ref 70–99)
Glucose-Capillary: 468 mg/dL — ABNORMAL HIGH (ref 70–99)

## 2019-08-29 LAB — HIV ANTIBODY (ROUTINE TESTING W REFLEX): HIV Screen 4th Generation wRfx: NONREACTIVE

## 2019-08-29 LAB — HEPARIN LEVEL (UNFRACTIONATED): Heparin Unfractionated: 1.66 IU/mL — ABNORMAL HIGH (ref 0.30–0.70)

## 2019-08-29 LAB — ECHOCARDIOGRAM COMPLETE
Height: 64 in
Weight: 3978.86 oz

## 2019-08-29 LAB — SEDIMENTATION RATE: Sed Rate: 32 mm/hr — ABNORMAL HIGH (ref 0–22)

## 2019-08-29 LAB — TSH: TSH: 0.489 u[IU]/mL (ref 0.350–4.500)

## 2019-08-29 LAB — APTT: aPTT: 58 seconds — ABNORMAL HIGH (ref 24–36)

## 2019-08-29 LAB — C-REACTIVE PROTEIN: CRP: 5.8 mg/dL — ABNORMAL HIGH (ref <1.0)

## 2019-08-29 LAB — CK TOTAL AND CKMB (NOT AT ARMC)
CK, MB: 3.2 ng/mL (ref 0.5–5.0)
Relative Index: INVALID (ref 0.0–2.5)
Total CK: 77 U/L (ref 38–234)

## 2019-08-29 LAB — SARS CORONAVIRUS 2 (TAT 6-24 HRS): SARS Coronavirus 2: NEGATIVE

## 2019-08-29 LAB — TROPONIN I (HIGH SENSITIVITY)
Troponin I (High Sensitivity): 95 ng/L — ABNORMAL HIGH (ref <18)
Troponin I (High Sensitivity): 52 ng/L — ABNORMAL HIGH (ref <18)

## 2019-08-29 LAB — HEMOGLOBIN A1C
Hgb A1c MFr Bld: 6.6 % — ABNORMAL HIGH (ref 4.8–5.6)
Mean Plasma Glucose: 142.72 mg/dL

## 2019-08-29 LAB — MAGNESIUM: Magnesium: 2 mg/dL (ref 1.7–2.4)

## 2019-08-29 LAB — GLUCOSE, RANDOM: Glucose, Bld: 441 mg/dL — ABNORMAL HIGH (ref 70–99)

## 2019-08-29 MED ORDER — OXYCODONE HCL 5 MG PO TABS
5.0000 mg | ORAL_TABLET | ORAL | Status: DC | PRN
  Administered 2019-08-29 – 2019-08-31 (×9): 5 mg via ORAL
  Filled 2019-08-29 (×9): qty 1

## 2019-08-29 MED ORDER — FUROSEMIDE 10 MG/ML IJ SOLN
20.0000 mg | Freq: Once | INTRAMUSCULAR | Status: AC
  Administered 2019-08-29: 20 mg via INTRAVENOUS
  Filled 2019-08-29: qty 2

## 2019-08-29 MED ORDER — HEPARIN (PORCINE) 25000 UT/250ML-% IV SOLN
1250.0000 [IU]/h | INTRAVENOUS | Status: DC
  Administered 2019-08-29: 1200 [IU]/h via INTRAVENOUS
  Administered 2019-08-30: 1250 [IU]/h via INTRAVENOUS
  Filled 2019-08-29 (×2): qty 250

## 2019-08-29 MED ORDER — ASPIRIN EC 81 MG PO TBEC
81.0000 mg | DELAYED_RELEASE_TABLET | Freq: Every day | ORAL | Status: DC
  Administered 2019-08-29 – 2019-08-31 (×3): 81 mg via ORAL
  Filled 2019-08-29 (×3): qty 1

## 2019-08-29 MED ORDER — INSULIN ASPART 100 UNIT/ML ~~LOC~~ SOLN
10.0000 [IU] | Freq: Once | SUBCUTANEOUS | Status: AC
  Administered 2019-08-29: 10 [IU] via SUBCUTANEOUS

## 2019-08-29 NOTE — Progress Notes (Addendum)
Patient Demographics:    Laura [name], is a 58 y.o. female, DOB - 1960/11/22, [id number]  Admit date - 08/28/2019   Admitting Physician Rhetta Mura, DO  Outpatient Primary MD for the patient is [name], MD  LOS - 1   Chief Complaint  Patient presents with   Shortness of Breath        Subjective:    Laura [name] today has no fevers, no emesis,   -Pleuritic type left-sided chest discomfort persist, -Dyspnea persist -Patient with generalized weakness, myalgias, fatigue and viral syndrome type symptoms  Assessment  & Plan :    Principal Problem:   Acute on chronic diastolic CHF (congestive heart failure) ([place]) Active Problems:   Uncontrolled type 2 diabetes mellitus with hyperglycemia ([place])   Hypothyroidism   Essential hypertension, benign   OSA on CPAP   Anxiety   Chest pain   Shortness of breath  Brief summary  58 y.o. female with medical history significant for dCHF, severe concentric LVH with EF 65 to 70%, PAFIb/Flutter chronically anticoagulated on Eliquis, DM2, morbid obesity with OSA on home nocturnal CPAP, moderate persistent asthma, HTN, who is admitted to [facility] on 08/28/2019 with suspected acute on chronic diastolic heart failure as well as pleuritic chest discomfort   A/p 1) atypical chest pains--- EKG with TWI , cardiology consult appreciated, PTA patient was on Eliquis, currently on IV heparin -Echocardiogram noted with preserved EF at 60 to 65% -Low clinical index of suspicion for acute PE -??  Cardiology trying to determine if patient needs LHC -Troponins not consistent with ACS,  -Continue aspirin, as well as Lipitor  2)HFpEF--- acute reserved EF CHF exacerbation, EF 60 to 65%, improved with IV Lasix, BNP was 715 with significant dyspnea on exertion -Cardiologist recommends holding further IV Lasix for now as creatinine is up to 1.38 from a  baseline around 1 -Continue to monitor daily weight and fluid input and output charting -Given viral syndrome cannot rule out possible viral induced cardiomyopathy -??  If patient needs LHC to rule out ischemic cardiomyopathy  3)PAF/Flutter --- - s/p DCCV in early 2000's, recurrent atrial flutter in 07/2018 and successful DCCV in 08/2018. -May need another cardioversion in the near future -Continue IV heparin for now Eliquis on hold to allow for possible LHC -Continue Cardizem CD 300 mg daily cfor rate control  4)DM2-A1c 6.6, reflecting excellent diabetic control PTA, continue Lantus insulin at 45 units nightly, Use Novolog/Humalog Sliding scale insulin with Accu-Cheks/Fingersticks as ordered   5) morbid obesity/OSA----Covid is negative, okay to use CPAP nightly  6) acute bronchitis--doxycycline as ordered, albuterol as needed.  Continue Dulera  7)H/o ASD repair-echo without significant shunt  8) hypothyroidism--stable, continue levothyroxine 1 1 2  mcg daily  9) depression/anxiety--stable, continue Lamictal 25 mg daily,, may use hydroxyzine for anxiety, melatonin for sleep  10)AKI----acute kidney injury due to IV Lasix/diuresis-     creatinine on admission=1.1  ,   baseline creatinine = 1.0   , creatinine is now=1.38      , renally adjust medications, avoid nephrotoxic agents/dehydration/hypotension -Hold Lasix, potassium is up to 5.8, repeat BMP in a.m.,   Disposition/Need for in-Hospital Stay- patient unable to be discharged at this time due to --chest pain requiring  further work-up including IV heparin  Code Status : Full  Family Communication:   NA (patient is alert, awake and coherent)   Disposition Plan  : TBD  Consults  :  cardiology  DVT Prophylaxis  : IV heparin  - SCD  Lab Results  Component Value Date   PLT 149 (L) 08/29/2019    Inpatient Medications  Scheduled Meds:  aspirin EC  81 mg Oral Daily   atorvastatin  40 mg Oral QHS   diltiazem  300 mg Oral q  morning - 10a   doxycycline  100 mg Oral Q12H   gabapentin  600 mg Oral TID   insulin aspart  0-15 Units Subcutaneous TID WC   insulin aspart  0-5 Units Subcutaneous QHS   insulin glargine  45 Units Subcutaneous QHS   lamoTRIgine  25 mg Oral Daily   levothyroxine  112 mcg Oral Q0600   Melatonin  3 mg Oral QHS   methylPREDNISolone (SOLU-MEDROL) injection  40 mg Intravenous Q6H   mometasone-formoterol  2 puff Inhalation BID   sodium chloride flush  3 mL Intravenous Q12H   Continuous Infusions:  heparin 1,200 Units/hr (08/29/19 1600)   PRN Meds:.acetaminophen, albuterol, hydrOXYzine, oxyCODONE    Anti-infectives (From admission, onward)   Start     Dose/Rate Route Frequency Ordered Stop   08/28/19 2200  doxycycline (VIBRA-TABS) tablet 100 mg     100 mg Oral Every 12 hours 08/28/19 2113          Objective:   Vitals:   08/29/19 0036 08/29/19 0500 08/29/19 0505 08/29/19 1413  BP: 116/68  102/62 (!) 132/41  Pulse: 83  100 77  Resp:   16 18  Temp: 97.9 F (36.6 C)  (!) 97.5 F (36.4 C) 97.8 F (36.6 C)  TempSrc: Oral  Oral Oral  SpO2: 95%  100% 100%  Weight:  112.8 kg    Height:        Wt Readings from Last 3 Encounters:  08/29/19 112.8 kg  07/14/19 114 kg  06/28/19 114.8 kg     Intake/Output Summary (Last 24 hours) at 08/29/2019 1649 Last data filed at 08/29/2019 1600 Gross per 24 hour  Intake 66.31 ml  Output 300 ml  Net -233.69 ml     Physical Exam  Gen:- Awake Alert, morbidly obese, in no acute distress HEENT:- [place].AT, No sclera icterus Neck-Supple Neck,No JVD,.  Lungs-  CTAB , fair symmetrical air movement,  CV- S1, S2 normal, irregularly irregular, left-sided chest wall tenderness is reproducible Abd-  +ve B.Sounds, Abd Soft, No tenderness, increased truncal adiposity Extremity/Skin:- No  edema, pedal pulses present Psych-affect is appropriate, oriented x3 Neuro-no new focal deficits, no tremors   Data Review:   Micro Results Recent  Results (from the past 240 hour(s))  SARS CORONAVIRUS 2 (TAT 6-24 HRS) Nasopharyngeal Nasopharyngeal Swab     Status: None   Collection Time: 08/28/19  7:13 PM   Specimen: Nasopharyngeal Swab  Result Value Ref Range Status   SARS Coronavirus 2 NEGATIVE NEGATIVE Final    Comment: (NOTE) SARS-CoV-2 target nucleic acids are NOT DETECTED. The SARS-CoV-2 RNA is generally detectable in upper and lower respiratory specimens during the acute phase of infection. Negative results do not preclude SARS-CoV-2 infection, do not rule out co-infections with other pathogens, and should not be used as the sole basis for treatment or other patient management decisions. Negative results must be combined with clinical observations, patient history, and epidemiological information. The expected result is Negative. Fact  Sheet for Patients: SugarRoll.be Fact Sheet for Healthcare Providers: https://www.woods-mathews.com/ This test is not yet approved or cleared by the [place] FDA and  has been authorized for detection and/or diagnosis of SARS-CoV-2 by FDA under an Emergency Use Authorization (EUA). This EUA will remain  in effect (meaning this test can be used) for the duration of the COVID-19 declaration under Section 56 4(b)(1) of the Act, 21 U.S.C. section 360bbb-3(b)(1), unless the authorization is terminated or revoked sooner. Performed at [facility], [place] [address], [place], [place] [address]     Radiology Reports Mr Knee Left Wo Contrast  Result Date: 08/23/2019 CLINICAL DATA:  Posterior left knee pain for 3 weeks. EXAM: MRI OF THE LEFT KNEE WITHOUT CONTRAST TECHNIQUE: Multiplanar, multisequence MR imaging of the knee was performed. No intravenous contrast was administered. COMPARISON:  None. FINDINGS: MENISCI Medial meniscus:  Intact. Lateral meniscus:  Intact. LIGAMENTS Cruciates:  Complete chronic ACL tear.  Intact PCL. Collaterals: Medial  collateral ligament is intact. Lateral collateral ligament complex is intact. CARTILAGE Patellofemoral: High-grade partial-thickness cartilage loss with areas of full-thickness cartilage loss of the lateral patellar facet. Partial-thickness cartilage loss throughout the remainder of the patellofemoral compartment. Medial: Mild partial-thickness cartilage loss of the medial femorotibial compartment. Lateral: Partial-thickness cartilage loss of the lateral femorotibial compartment. Joint: Large joint effusion. Normal Hoffa's fat. No plical thickening. Popliteal Fossa:  Tiny Baker's cyst.  Intact popliteus tendon. Extensor Mechanism: Intact quadriceps tendon. Intact patellar tendon. Intact medial patellar retinaculum. Intact lateral patellar retinaculum. Intact MPFL. Bones:  No acute osseous abnormality.  No aggressive osseous lesion. Other: No fluid collection or hematoma.  Muscles are normal. IMPRESSION: 1. Complete chronic ACL tear. 2. Tricompartmental cartilage abnormalities as described above. Electronically Signed   By: Kathreen Devoid   On: 08/23/2019 08:50   Dg Chest Portable 1 View  Result Date: 08/28/2019 CLINICAL DATA:  58 year old female with shortness of breath. EXAM: PORTABLE CHEST 1 VIEW COMPARISON:  Chest radiograph dated 08/10/2018. FINDINGS: Mild diffuse interstitial prominence similar to prior radiograph. No focal consolidation, pleural effusion, pneumothorax. The cardiac silhouette is within normal limits. Atherosclerotic calcification of the aortic arch. No acute osseous pathology. IMPRESSION: 1. No acute cardiopulmonary process. 2. Stable mild interstitial prominence. Electronically Signed   By: Anner Crete M.D.   On: 08/28/2019 16:13     CBC Recent Labs  Lab 08/28/19 1517 08/29/19 0718  WBC 6.6 3.6*  HGB 13.5 13.0  HCT 40.4 40.0  PLT 163 149*  MCV 99.5 100.5*  MCH 33.3 32.7  MCHC 33.4 32.5  RDW 12.4 12.4  LYMPHSABS 2.3 0.7  MONOABS 0.4 0.1  EOSABS 0.0 0.0  BASOSABS 0.0  0.0    Chemistries  Recent Labs  Lab 08/28/19 1517 08/29/19 0718  NA 143 138  K 4.4 5.8*  CL 107 101  CO2 25 24  GLUCOSE 142* 234*  BUN 25* 37*  CREATININE 1.17* 1.38*  CALCIUM 9.2 9.0  MG 2.0 2.0  AST 16  --   ALT 13  --   ALKPHOS 52  --   BILITOT 0.8  --    ------------------------------------------------------------------------------------------------------------------ Recent Labs    08/29/19 0718  CHOL 137  HDL 52  LDLCALC 65  TRIG 101  CHOLHDL 2.6    Lab Results  Component Value Date   HGBA1C 6.6 (H) 08/28/2019   ------------------------------------------------------------------------------------------------------------------ Recent Labs    08/29/19 0718  TSH 0.489   ------------------------------------------------------------------------------------------------------------------ No results for input(s): VITAMINB12, FOLATE, FERRITIN, TIBC, IRON, RETICCTPCT in the  last 72 hours.  Coagulation profile No results for input(s): INR, PROTIME in the last 168 hours.  No results for input(s): DDIMER in the last 72 hours.  Cardiac Enzymes Recent Labs  Lab 08/29/19 0104  CKMB 3.2   ------------------------------------------------------------------------------------------------------------------    Component Value Date/Time   BNP 715.0 (H) 08/28/2019 1517     Roxan Hockey M.D on 08/29/2019 at 4:49 PM  Go to [email or web address] - for contact info  [facility] - Office  [phone]

## 2019-08-29 NOTE — Consult Note (Addendum)
Cardiology Consult    [name] ID: Laura [name]; [id number]; October 30, 1960   Admit date: 08/28/2019 Date of Consult: 08/29/2019  Primary Care Provider: [name], MD Primary Cardiologist: Kate Sable, MD   [name] Profile    Laura [name] is a 58 y.o. female with past medical history of paroxysmal atrial fibrillation (s/p DCCV in early 2000's, recurrent atrial flutter in 07/2018 and successful DCCV in 08/2018), HTN, HLD, IDDM, severe asthma, OSA, and history of ASD repair (echo in 08/2018 showing no significant shunting) who is being seen today for Laura evaluation of chest pain and abnormal EKG at Laura request of Dr. Maudie Mercury.   History of Present Illness    Laura [name] most recently had a telehealth visit with Dr. Bronson Ing in 06/2019 and denied any recent chest pain or palpitations at that time. She did report having gained 7 pounds within Laura past few months and had experienced some dyspnea with this. She was continued on her current medication regimen at that time including Cardizem CD 300 mg daily and Eliquis 5 mg twice daily.  She presented to [facility] ED on 08/28/2019 for evaluation of worsening dyspnea on exertion with associated chest discomfort. In talking with Laura [name] today, she reports becoming acutely sick on Monday as she was fired from her job. She says she had significant nausea and vomiting at that time. Starting on Tuesday, she developed worsening shortness of breath and reports associated discomfort along her left pectoral region. She was evaluated by her PCP but was not informed of Laura results until yesterday and says she was told she had "fluid around her heart and heart enzymes were up".  She did have a rapid Covid test by her PCP at that time which was negative per her report.  Today, she reports still having significant fatigue with associated body aches, headache and chills. She describes episodes of chest discomfort overnight which have been worse with deep  breathing and coughing. There has been no association of her pain with exertion. Prior to this past Monday, she had been in her normal state of health. She denies any recent palpitations and was actually unaware that she was back in atrial fibrillation. No recent orthopnea, PND or lower extremity edema.  Initial labs show WBC 6.6, Hgb 13.5, platelets 163, Na+ 143, K+ 4.4 and creatinine 1.17. Mg 2.0. COVID pending. Influenza panel negative. BNP 715. CRP elevated to 5.8. Initial HS Troponin 114 with repeat of 95. CXR with no acute cardiopulmonary abnormalities. EKG on admission showed atrial fibrillation, HR 75 with diffuse TWI along Laura inferior and lateral leads which was new when compared to prior tracings.   She was given IV Lasix 40mg  yesterday and reports significant urine output but strict I&O's were not recorded. Reports her weight was 251 - 253 lbs earlier this month, at 248 lbs this AM but she reports having been dieting over Laura past few weeks and walking more regularly.    Past Medical History:  Diagnosis Date  . Asthma   . Atrial fibrillation ([place])   . Bipolar disorder ([place])   . Bronchiectasis ([place])   . CHF (congestive heart failure) ([place])   . Complication of anesthesia    "my Dr in [place] told me I had an allergic reaction to Laura combination of Laura pain meds and Laura anesthesia" she does not remember what happened but "I eneded up in Laura ICU for 5 days".  . H/O congenital atrial septal defect (ASD) repair   .  Hypogammaglobulinemia ([place])   . Hypothyroid   . 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 Neuropathy   . Osteoarthritis   . PTSD (post-traumatic stress disorder)   . Recurrent sinusitis   . Short-term memory loss   . Sleep apnea   . TIA (transient ischemic attack)   . Tremors of nervous system    seeing PCP for this.  . Type 2 diabetes mellitus ([place])   . Urticaria   . Wound infection     Past Surgical History:  Procedure Laterality Date  . 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 CARDIAC SURGERY     . CARDIOVERSION    . CARDIOVERSION N/A 08/29/2018   Procedure: CARDIOVERSION;  Surgeon: Arnoldo Lenis, MD;  Location: [facility];  Service: Endoscopy;  Laterality: N/A;  . [place]  . NASAL SINUS SURGERY  2017     Home Medications:  Prior to Admission medications   Medication Sig Start Date End Date Taking? Authorizing Provider  acetaminophen (TYLENOL) 325 MG tablet Take 2 tablets (650 mg total) by mouth every 6 (six) hours as needed for mild pain, fever or headache. 08/11/18   Roxan Hockey, MD  Albuterol Sulfate (PROAIR RESPICLICK) [id number] (90 Base) MCG/ACT AEPB Inhale 1 puff into Laura lungs every 6 (six) hours as needed (for shortness of breath/wheezing).     [provider]  apixaban (ELIQUIS) 5 MG TABS tablet Take 5 mg by mouth 2 (two) times daily.    [provider]  atorvastatin (LIPITOR) 10 MG tablet Take 10 mg by mouth every evening.     [provider]  diltiazem (CARDIZEM CD) 300 MG 24 hr capsule Take 1 capsule (300 mg total) by mouth every morning. 08/16/19   Herminio Commons, MD  divalproex (DEPAKOTE ER) 500 MG 24 hr tablet Take 3 tablets (1,500 mg total) by mouth 2 (two) times daily. 1000 mg in AM, 1500 mg in PM 10/10/19   Hisada, Elie Goody, MD  Dulaglutide (TRULICITY) 1.5 0000000 SOPN Inject 1.5 mg into Laura skin every Sunday.     [provider]  furosemide (LASIX) 20 MG tablet Take 20-40 mg by mouth See admin instructions. Take 2 tablets (40mg  total) every morning and 1 tablet (20mg  total)  at noon.    [provider]  gabapentin (NEURONTIN) 600 MG tablet Take 600 mg by mouth 3 (three) times daily.    [provider]  HUMALOG KWIKPEN 100 UNIT/ML KwikPen Sliding scale. 08/28/18   [provider]  hydrALAZINE (APRESOLINE) 25 MG tablet Take 25 mg by mouth 3 (three) times daily.    [provider]  hydrochlorothiazide (HYDRODIURIL) 25 MG tablet Take 25 mg by mouth every morning.      [provider]  hydrOXYzine (ATARAX/VISTARIL) 25 MG tablet Take 1 tablet (25 mg total) by mouth every 6 (six) hours as needed for anxiety or nausea. 08/11/18   Roxan Hockey, MD  Immune Globulin, Human, (CUVITRU) 4 GM/20ML SOLN Inject 100 mLs into Laura skin every 14 (fourteen) days.    [provider]  insulin glargine (LANTUS) 100 UNIT/ML injection Inject 60 Units into Laura skin at bedtime.    [provider]  lamoTRIgine (LAMICTAL) 25 MG tablet Take 1 tablet (25 mg total) by mouth daily. 08/16/19   [name], MD  levothyroxine (SYNTHROID, LEVOTHROID) 112 MCG tablet Take 112 mcg by mouth daily before breakfast.    [provider]  lisinopril (PRINIVIL,ZESTRIL) 40 MG tablet Take 40 mg by  mouth every morning.     [provider]  Melatonin 3 MG TABS Take 3 mg by mouth at bedtime.    [provider]  metFORMIN (GLUCOPHAGE-XR) 500 MG 24 hr tablet Take 1,000 mg by mouth 2 (two) times daily.  04/11/18   [provider]  mometasone-formoterol (DULERA) 200-5 MCG/ACT AERO Inhale 2 puffs into Laura lungs 2 (two) times daily. 06/21/19   Valentina Shaggy, MD  ondansetron (ZOFRAN) 4 MG tablet Take 1 tablet (4 mg total) by mouth every 8 (eight) hours as needed for nausea or vomiting. 07/13/19   Chevis Pretty, FNP  Spacer/Aero-Holding Chambers (AEROCHAMBER MV) inhaler Use as instructed 10/24/18   Juanito Doom, MD  tiotropium (SPIRIVA) 18 MCG inhalation capsule Place 18 mcg into inhaler and inhale daily as needed (for breathing).     [provider]  VENTOLIN HFA 108 (90 Base) MCG/ACT inhaler INHALE 2 PUFFS INTO Laura LUNGS EVERY 6 HOURS AS NEEDED FOR WHEEZING OR SHORTNESS OF BREATH 08/12/19   Valentina Shaggy, MD    Inpatient Medications: Scheduled Meds: . atorvastatin  40 mg Oral QHS  . diltiazem  300 mg Oral q morning - 10a  . doxycycline  100 mg Oral Q12H  . furosemide  20 mg Intravenous Once  . gabapentin  600 mg  Oral TID  . insulin aspart  0-15 Units Subcutaneous TID WC  . insulin aspart  0-5 Units Subcutaneous QHS  . insulin glargine  45 Units Subcutaneous QHS  . lamoTRIgine  25 mg Oral Daily  . levothyroxine  112 mcg Oral Q0600  . Melatonin  3 mg Oral QHS  . methylPREDNISolone (SOLU-MEDROL) injection  40 mg Intravenous Q6H  . mometasone-formoterol  2 puff Inhalation BID  . sodium chloride flush  3 mL Intravenous Q12H   Continuous Infusions:  PRN Meds: acetaminophen, albuterol, HYDROcodone-acetaminophen, hydrOXYzine  Allergies:    Allergies  Allergen Reactions  . Ativan [Lorazepam] Hives  . Phenergan [Promethazine Hcl] Hives    Social History:   Social History   Socioeconomic History  . Marital status: Widowed    Spouse name: Not on file  . Number of children: Not on file  . Years of education: Not on file  . Highest education level: Not on file  Occupational History  . Not on file  Social Needs  . Financial resource strain: [name] refused  . Food insecurity    Worry: [name] refused    Inability: [name] refused  . Transportation needs    Medical: [name] refused    Non-medical: [name] refused  Tobacco Use  . Smoking status: Former Smoker    Packs/day: 1.00    Years: 17.00    Pack years: 17.00    Types: Cigarettes    Quit date: 2008    Years since quitting: 12.8  . Smokeless tobacco: Never Used  Substance and Sexual Activity  . Alcohol use: Not Currently  . Drug use: Not Currently  . Sexual activity: Yes    Birth control/protection: None  Lifestyle  . Physical activity    Days per week: [name] refused    Minutes per session: [name] refused  . Stress: [name] refused  Relationships  . Social Herbalist on phone: [name] refused    Gets together: [name] refused    Attends religious service: [name] refused    Active member of club or organization: [name] refused    Attends meetings of clubs or organizations: [name] refused    Relationship  status: [name] refused  . Intimate partner violence    Fear of current or ex partner: [name] refused    Emotionally abused: [name] refused    Physically abused: [name] refused    Forced sexual activity: [name] refused  Other Topics Concern  . Not on file  Social History Narrative  . Not on file     Family History:    Family History  Problem Relation Age of Onset  . Hypertension Mother   . Stroke Mother   . Kidney disease Mother   . Heart attack Mother   . Alcohol abuse Mother   . Kidney disease Sister   . Heart disease Brother   . Stroke Maternal Aunt   . Heart disease Maternal Aunt   . Hypertension Sister   . Bipolar disorder Sister   . Thyroid disease Sister   . Thyroid disease Daughter   . Thyroid disease Daughter   . Hashimoto's thyroiditis Daughter   . Celiac disease Daughter   . Allergic rhinitis Daughter   . Thyroid disease Daughter   . Asthma Neg Hx       Review of Systems    General:  No fever, night sweats or weight changes. Positive for chills, body aches and fatigue.  Cardiovascular:  No edema, orthopnea, palpitations, paroxysmal nocturnal dyspnea. Positive for chest pain and dyspnea on exertion.  Dermatological: No rash, lesions/masses Respiratory: No cough, Positive for dyspnea. Urologic: No hematuria, dysuria Abdominal:   No vomiting, diarrhea, bright red blood per rectum, melena, or hematemesis. Positive for nausea.  Neurologic:  No visual changes, wkns, changes in mental status. All other systems reviewed and are otherwise negative except as noted above.  Physical Exam/Data    Vitals:   08/28/19 1949 08/29/19 0036 08/29/19 0500 08/29/19 0505  BP: (!) 103/44 116/68  102/62  Pulse: 86 83  100  Resp: 16   16  Temp: 98 F (36.7 C) 97.9 F (36.6 C)  (!) 97.5 F (36.4 C)  TempSrc: Oral Oral  Oral  SpO2: 99% 95%  100%  Weight: 111.3 kg  112.8 kg   Height: 5\' 4"  (1.626 m)       Intake/Output Summary (Last 24 hours) at 08/29/2019 0900  Last data filed at 08/29/2019 0600 Gross per 24 hour  Intake -  Output 300 ml  Net -300 ml   Filed Weights   08/28/19 1432 08/28/19 1949 08/29/19 0500  Weight: 114.8 kg 111.3 kg 112.8 kg   Body mass index is 42.69 kg/m.   General: Pleasant female appearing in NAD Psych: Normal affect. Neuro: Alert and oriented X 3. Moves all extremities spontaneously. HEENT: Normal  Neck: Supple without bruits or JVD. Lungs:  Resp regular and unlabored, mild rales along bases. Heart: Irregularly irregular. no s3, s4, or murmurs. Abdomen: Soft, non-tender, non-distended, BS + x 4.  Extremities: No clubbing, cyanosis or lower extremity edemaedema. DP/PT/Radials 2+ and equal bilaterally.   EKG:  Laura EKG was personally reviewed and demonstrates: Atrial fibrillation, HR 75 with diffuse TWI along Laura inferior and lateral leads which was new when compared to prior tracings.   Telemetry:  Telemetry was personally reviewed and demonstrates: Atrial fibrillation, HR in 70's to 80's.    Labs/Studies     Relevant CV Studies:  Echocardiogram: 08/11/2018 Study Conclusions  - Procedure narrative: Transthoracic echocardiography. Image   quality was poor. Laura study was technically difficult, as a   result of poor sound wave transmission and body habitus.   Intravenous contrast (Definity)  was administered. - Left ventricle: Laura cavity size was normal. There was severe   concentric hypertrophy. Systolic function was vigorous. Laura   estimated ejection fraction was in Laura range of 65% to 70%. No   thrombus noted with Definity contrast. Wall motion was normal;   there were no regional wall motion abnormalities. Laura study is   not technically sufficient to allow evaluation of LV diastolic   function. LV filling pressure is elevated. - Aortic valve: Calcified. poorly visualized leaflets. No stenosis. - Mitral valve: Calcified annulus. Mildly thickened leaflets . - Left atrium: Laura atrium was mildly dilated.  - Inferior vena cava: Laura vessel was dilated. Laura respirophasic   diameter changes were blunted (< 50%), consistent with elevated   central venous pressure.  Impressions:  - Technically difficult study. Definity contrast given. LVEF   65-70%, severe LVH, normal wall motion, indeterminate diastolic   function with elevated LV filling pressure, mild LAE, dilated   IVC.  Event Monitor: 09/21/2018  Primarily sinus rhythm (sinus bradycardia and sinus tachycardia also seen) with PVC's (one 6-beat run of nonsustained ventricular tachycardia) and occasional PAC's.     Laboratory Data:  Chemistry Recent Labs  Lab 08/28/19 1517 08/29/19 0718  NA 143 138  K 4.4 5.8*  CL 107 101  CO2 25 24  GLUCOSE 142* 234*  BUN 25* 37*  CREATININE 1.17* 1.38*  CALCIUM 9.2 9.0  GFRNONAA 52* 42*  GFRAA 60* 49*  ANIONGAP 11 13    Recent Labs  Lab 08/28/19 1517  PROT 7.5  ALBUMIN 3.8  AST 16  ALT 13  ALKPHOS 52  BILITOT 0.8   Hematology Recent Labs  Lab 08/28/19 1517 08/29/19 0718  WBC 6.6 3.6*  RBC 4.06 3.98  HGB 13.5 13.0  HCT 40.4 40.0  MCV 99.5 100.5*  MCH 33.3 32.7  MCHC 33.4 32.5  RDW 12.4 12.4  PLT 163 149*   Cardiac EnzymesNo results for input(s): TROPONINI in Laura last 168 hours. No results for input(s): TROPIPOC in Laura last 168 hours.  BNP Recent Labs  Lab 08/28/19 1517  BNP 715.0*    DDimer No results for input(s): DDIMER in Laura last 168 hours.  Radiology/Studies:  Dg Chest Portable 1 View  Result Date: 08/28/2019 CLINICAL DATA:  58 year old female with shortness of breath. EXAM: PORTABLE CHEST 1 VIEW COMPARISON:  Chest radiograph dated 08/10/2018. FINDINGS: Mild diffuse interstitial prominence similar to prior radiograph. No focal consolidation, pleural effusion, pneumothorax. Laura cardiac silhouette is within normal limits. Atherosclerotic calcification of Laura aortic arch. No acute osseous pathology. IMPRESSION: 1. No acute cardiopulmonary process. 2. Stable  mild interstitial prominence. Electronically Signed   By: Anner Crete M.D.   On: 08/28/2019 16:13     Assessment & Plan    1. Pleuritic Chest Pain/ Abnormal EKG - presents with new onset symptoms starting Monday which initially presented as nausea but she reports a constellation of symptoms including body aches, subjective fevers, chills and headaches. She did develop acute dyspnea 2 days ago with associated discomfort along her left pectoral region. She is still having episodes of chest discomfort which presents with deep breathing or coughing.  Denies any exertional symptoms. - Initial HS Troponin 114 with repeat of 95. CRP elevated to 5.8. EKG on admission showed atrial fibrillation, HR 75 with diffuse TWI along Laura inferior and lateral leads which was new when compared to prior tracings.  - Overall, her symptoms appear more consistent with a pleuritic etiology but she does have significant  EKG changes when compared to prior tracings. Will plan to obtain an echocardiogram today to assess LV function and wall motion. Pending study results along with her symptoms and Covid testing, may need to do consider a cardiac catheterization for definitive evaluation. Given this, will hold Eliquis starting this morning (last dose PM of 11/18) and start Heparin as she would require a 24-36 hour wash out period. Continue ASA and statin therapy.   2. Acute CHF Exacerbation (Echo pending to determine Systolic versus Diastolic Dysfunction) - She reports worsening dyspnea on exertion leading up to admission and BNP was elevated to 715. She did receive IV Lasix 40 mg in Laura ED yesterday with significant urine output per her report. Given bump in creatinine from 1.17 on admission to 1.38 today, will reduce dosing to 20mg .  - She is at risk for a viral or stress induced cardiomyopathy given Laura above but cannot exclude an ischemic etiology either. Echo to be obtained today. If EF reduced, would need to switch from  CCB therapy to BB.   3. Paroxysmal Atrial Fibrillation - s/p DCCV in early 2000's, recurrent atrial flutter in 07/2018 and successful DCCV in 08/2018. - found to be in recurrent atrial fibrillation this admission but was overall asymptomatic. Once she recovers from her acute illness and has been resumed on uninterrupted anticoagulation for 3 weeks, could consider repeat DCCV.  - remains on Cardizem CD 300mg  daily for rate-control.  4. History of ASD Repair - echo in 08/2018 showing no significant shunting. Repeat imaging pending.   5. Hyperkalemia - K+ 5.8 this AM. Scheduled to receive Lasix this morning. Repeat BMET in AM.    For questions or updates, please contact [place] Please consult [email or web address] for contact info under Cardiology/STEMI.  Signed, Erma Heritage, PA-C 08/29/2019, 9:00 AM Pager: [phone]   Laura [name] was seen and examined, and I agree with Laura history, physical exam, assessment and plan as documented above, with modifications as noted below. I have also personally reviewed all relevant documentation, old records, labs, and both radiographic and cardiovascular studies. I have also independently interpreted old and new ECG's.  Laura [name] is a 58 year old woman who is well-known to me from Laura outpatient setting.  She has a history of paroxysmal atrial fibrillation and atrial flutter.  She has a history of ASD repair.  When I spoke with her in September she was doing well.  She had been doing well up until Laura past few days.  She got into an argument with a family member and shortly thereafter began experiencing generalized weakness and progressive shortness of breath.  She is unable to walk room without feeling tired and short of breath.  Symptoms began this past Monday.  She went to Laura bathroom and started having significant nausea and vomiting.  Her shortness of breath became gradually worse.  She saw her PCP who did a rapid Covid test which was  negative.  Today she complains of diffuse body aches and pain and tenderness in Laura left pectoral region, left shoulder and left scapular region.  She denies exertional chest pain per se.  She denies orthopnea and paroxysmal external dyspnea.  Labs reviewed above.  ECG shows atrial fibrillation and T wave inversions in inferior and anterolateral leads.  These are new findings.  BNP was elevated and she was given 40 mg of IV Lasix yesterday and another 20 mg of IV Lasix this morning.  Up until this point she had been trying to make changes  to her lifestyle and improve her diet and exercise more.  While ECG is grossly abnormal, symptoms of chest pain are atypical for a cardiac etiology.  High-sensitivity troponins are nonspecifically elevated in Laura context of what appears to be an acute viral illness.  We will plan to obtain an echocardiogram to evaluate cardiac structure and function today.  Her brother underwent four-vessel CABG in his late 72s.  Hence, she is certainly at risk for having ischemic heart disease.  I would not rule out Laura possibility of cardiac catheterization and will hence hold Eliquis and start IV heparin. Continue aspirin and statin.  Creatinine is up to 1.38 today so will not give any further doses of Lasix.  Atrial fibrillation heart rates are controlled on Cardizem CD 300 mg daily.   Kate Sable, MD, [facility]  08/29/2019 9:56 AM

## 2019-08-29 NOTE — Progress Notes (Signed)
Patient placed on full face mask with CPAP at 11 cm H2O ( pt wears 12 on her home machine with 2 lpm [place] bled into machine. Rt/Rn will continue to monitor pt through the night.

## 2019-08-29 NOTE — Progress Notes (Addendum)
ANTICOAGULATION CONSULT NOTE - Initial Consult  Pharmacy Consult for heparin Indication: atrial fibrillation/abnormal EKG  Allergies  Allergen Reactions  . Ativan [Lorazepam] Hives  . Phenergan [Promethazine Hcl] Hives    Patient Measurements: Height: 5\' 4"  (162.6 cm) Weight: 248 lb 10.9 oz (112.8 kg) IBW/kg (Calculated) : 54.7 Heparin Dosing Weight: 82 kg  Vital Signs: Temp: 97.5 F (36.4 C) (11/19 0505) Temp Source: Oral (11/19 0505) BP: 102/62 (11/19 0505) Pulse Rate: 100 (11/19 0505)  Labs: Recent Labs    08/28/19 1517 08/29/19 0104 08/29/19 0718  HGB 13.5  --  13.0  HCT 40.4  --  40.0  PLT 163  --  149*  CREATININE 1.17*  --  1.38*  CKTOTAL  --  77  --   CKMB  --  PENDING  --   TROPONINIHS 114* 95* 52*    Estimated Creatinine Clearance: 55.3 mL/min (A) (by C-G formula based on SCr of 1.38 mg/dL (H)).   Medical History: Past Medical History:  Diagnosis Date  . Asthma   . Atrial fibrillation ([place])   . Bipolar disorder ([place])   . Bronchiectasis ([place])   . CHF (congestive heart failure) ([place])   . Complication of anesthesia    "my Dr in [place] told me I had an allergic reaction to the combination of the pain meds and the anesthesia" she does not remember what happened but "I eneded up in the ICU for 5 days".  . Hypogammaglobulinemia ([place])   . Hypothyroid   . 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 Neuropathy   . Osteoarthritis   . PTSD (post-traumatic stress disorder)   . Recurrent sinusitis   . Short-term memory loss   . Sleep apnea   . TIA (transient ischemic attack)   . Tremors of nervous system    seeing PCP for this.  . Type 2 diabetes mellitus ([place])   . Urticaria   . Wound infection     Medications:  Medications Prior to Admission  Medication Sig Dispense Refill Last Dose  . acetaminophen (TYLENOL) 325 MG tablet Take 2 tablets (650 mg total) by mouth every 6 (six) hours as needed for mild pain, fever or headache. 30 tablet 1   . Albuterol Sulfate  (PROAIR RESPICLICK) 123XX123 (90 Base) MCG/ACT AEPB Inhale 1 puff into the lungs every 6 (six) hours as needed (for shortness of breath/wheezing).      Marland Kitchen apixaban (ELIQUIS) 5 MG TABS tablet Take 5 mg by mouth 2 (two) times daily.     Marland Kitchen atorvastatin (LIPITOR) 10 MG tablet Take 10 mg by mouth every evening.      . diltiazem (CARDIZEM CD) 300 MG 24 hr capsule Take 1 capsule (300 mg total) by mouth every morning. 90 capsule 3   . [START ON 10/10/2019] divalproex (DEPAKOTE ER) 500 MG 24 hr tablet Take 3 tablets (1,500 mg total) by mouth 2 (two) times daily. 1000 mg in AM, 1500 mg in PM 450 tablet 0   . Dulaglutide (TRULICITY) 1.5 0000000 SOPN Inject 1.5 mg into the skin every Sunday.      . furosemide (LASIX) 20 MG tablet Take 20-40 mg by mouth See admin instructions. Take 2 tablets (40mg  total) every morning and 1 tablet (20mg  total)  at noon.     . gabapentin (NEURONTIN) 600 MG tablet Take 600 mg by mouth 3 (three) times daily.     Marland Kitchen HUMALOG KWIKPEN 100 UNIT/ML KwikPen Sliding scale.     . hydrALAZINE (APRESOLINE) 25 MG tablet Take  25 mg by mouth 3 (three) times daily.     . hydrochlorothiazide (HYDRODIURIL) 25 MG tablet Take 25 mg by mouth every morning.      . hydrOXYzine (ATARAX/VISTARIL) 25 MG tablet Take 1 tablet (25 mg total) by mouth every 6 (six) hours as needed for anxiety or nausea. 30 tablet 0   . Immune Globulin, Human, (CUVITRU) 4 GM/20ML SOLN Inject 100 mLs into the skin every 14 (fourteen) days.     . insulin glargine (LANTUS) 100 UNIT/ML injection Inject 60 Units into the skin at bedtime.     . lamoTRIgine (LAMICTAL) 25 MG tablet Take 1 tablet (25 mg total) by mouth daily. 90 tablet 0   . levothyroxine (SYNTHROID, LEVOTHROID) 112 MCG tablet Take 112 mcg by mouth daily before breakfast.     . lisinopril (PRINIVIL,ZESTRIL) 40 MG tablet Take 40 mg by mouth every morning.      . Melatonin 3 MG TABS Take 3 mg by mouth at bedtime.     . metFORMIN (GLUCOPHAGE-XR) 500 MG 24 hr tablet Take 1,000 mg  by mouth 2 (two) times daily.   0   . mometasone-formoterol (DULERA) 200-5 MCG/ACT AERO Inhale 2 puffs into the lungs 2 (two) times daily. 13 g 5   . ondansetron (ZOFRAN) 4 MG tablet Take 1 tablet (4 mg total) by mouth every 8 (eight) hours as needed for nausea or vomiting. 20 tablet 0   . Spacer/Aero-Holding Chambers (AEROCHAMBER MV) inhaler Use as instructed 1 each 0   . tiotropium (SPIRIVA) 18 MCG inhalation capsule Place 18 mcg into inhaler and inhale daily as needed (for breathing).      . VENTOLIN HFA 108 (90 Base) MCG/ACT inhaler INHALE 2 PUFFS INTO THE LUNGS EVERY 6 HOURS AS NEEDED FOR WHEEZING OR SHORTNESS OF BREATH 18 g 1     Assessment: Pharmacy consulted to dose heparin in patient with atrial fibrillation and possible cardiac cath.  Patient is on apixaban prior to admission with last dose 11/18 2156.  Will start heparin infusion at time of next apixaban dose and dose based of aPTT until correlation with heparin levels.  Goal of Therapy:  Heparin level 0.3-0.7 units/ml aPTT 66-102 seconds Monitor platelets by anticoagulation protocol: Yes   Plan:  No bolus Start heparin infusion at 1200 units/hr Check anti-Xa level in 6 hours and daily while on heparin Continue to monitor H&H and platelets  Margot Ables, PharmD Clinical Pharmacist 08/29/2019 9:03 AM

## 2019-08-29 NOTE — Progress Notes (Signed)
ANTICOAGULATION CONSULT NOTE - Initial Consult  Pharmacy Consult for heparin Indication: atrial fibrillation/abnormal EKG  Allergies  Allergen Reactions  . Ativan [Lorazepam] Hives  . Phenergan [Promethazine Hcl] Hives    Patient Measurements: Height: 5\' 4"  (162.6 cm) Weight: 248 lb 10.9 oz (112.8 kg) IBW/kg (Calculated) : 54.7 Heparin Dosing Weight: 82 kg  Vital Signs: Temp: 97.8 F (36.6 C) (11/19 1413) Temp Source: Oral (11/19 1413) BP: 132/41 (11/19 1413) Pulse Rate: 77 (11/19 1413)  Labs: Recent Labs    08/28/19 1517 08/29/19 0104 08/29/19 0718 08/29/19 1040 08/29/19 1510  HGB 13.5  --  13.0  --   --   HCT 40.4  --  40.0  --   --   PLT 163  --  149*  --   --   APTT  --   --   --   --  58*  HEPARINUNFRC  --   --   --  1.66*  --   CREATININE 1.17*  --  1.38*  --   --   CKTOTAL  --  77  --   --   --   CKMB  --  3.2  --   --   --   TROPONINIHS 114* 95* 52*  --   --     Estimated Creatinine Clearance: 55.3 mL/min (A) (by C-G formula based on SCr of 1.38 mg/dL (H)).   Medical History: Past Medical History:  Diagnosis Date  . Asthma   . Atrial fibrillation ([place])   . Bipolar disorder ([place])   . Bronchiectasis ([place])   . CHF (congestive heart failure) ([place])   . Complication of anesthesia    "my Dr in [place] told me I had an allergic reaction to the combination of the pain meds and the anesthesia" she does not remember what happened but "I eneded up in the ICU for 5 days".  . H/O congenital atrial septal defect (ASD) repair   . Hypogammaglobulinemia ([place])   . Hypothyroid   . 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 Neuropathy   . Osteoarthritis   . PTSD (post-traumatic stress disorder)   . Recurrent sinusitis   . Short-term memory loss   . Sleep apnea   . TIA (transient ischemic attack)   . Tremors of nervous system    seeing PCP for this.  . Type 2 diabetes mellitus ([place])   . Urticaria   . Wound infection     Medications:  Medications Prior to Admission   Medication Sig Dispense Refill Last Dose  . acetaminophen (TYLENOL) 325 MG tablet Take 2 tablets (650 mg total) by mouth every 6 (six) hours as needed for mild pain, fever or headache. 30 tablet 1   . Albuterol Sulfate (PROAIR RESPICLICK) 123XX123 (90 Base) MCG/ACT AEPB Inhale 1 puff into the lungs every 6 (six) hours as needed (for shortness of breath/wheezing).      Marland Kitchen apixaban (ELIQUIS) 5 MG TABS tablet Take 5 mg by mouth 2 (two) times daily.     Marland Kitchen atorvastatin (LIPITOR) 10 MG tablet Take 10 mg by mouth every evening.      . diltiazem (CARDIZEM CD) 300 MG 24 hr capsule Take 1 capsule (300 mg total) by mouth every morning. 90 capsule 3   . [START ON 10/10/2019] divalproex (DEPAKOTE ER) 500 MG 24 hr tablet Take 3 tablets (1,500 mg total) by mouth 2 (two) times daily. 1000 mg in AM, 1500 mg in PM 450 tablet 0   . Dulaglutide (TRULICITY) 1.5  MG/0.5ML SOPN Inject 1.5 mg into the skin every Sunday.      . furosemide (LASIX) 20 MG tablet Take 20-40 mg by mouth See admin instructions. Take 2 tablets (40mg  total) every morning and 1 tablet (20mg  total)  at noon.     . gabapentin (NEURONTIN) 600 MG tablet Take 600 mg by mouth 3 (three) times daily.     Marland Kitchen HUMALOG KWIKPEN 100 UNIT/ML KwikPen Sliding scale.     . hydrALAZINE (APRESOLINE) 25 MG tablet Take 25 mg by mouth 3 (three) times daily.     . hydrochlorothiazide (HYDRODIURIL) 25 MG tablet Take 25 mg by mouth every morning.      . hydrOXYzine (ATARAX/VISTARIL) 25 MG tablet Take 1 tablet (25 mg total) by mouth every 6 (six) hours as needed for anxiety or nausea. 30 tablet 0   . Immune Globulin, Human, (CUVITRU) 4 GM/20ML SOLN Inject 100 mLs into the skin every 14 (fourteen) days.     . insulin glargine (LANTUS) 100 UNIT/ML injection Inject 60 Units into the skin at bedtime.     . lamoTRIgine (LAMICTAL) 25 MG tablet Take 1 tablet (25 mg total) by mouth daily. 90 tablet 0   . levothyroxine (SYNTHROID, LEVOTHROID) 112 MCG tablet Take 112 mcg by mouth daily  before breakfast.     . lisinopril (PRINIVIL,ZESTRIL) 40 MG tablet Take 40 mg by mouth every morning.      . Melatonin 3 MG TABS Take 3 mg by mouth at bedtime.     . metFORMIN (GLUCOPHAGE-XR) 500 MG 24 hr tablet Take 1,000 mg by mouth 2 (two) times daily.   0   . mometasone-formoterol (DULERA) 200-5 MCG/ACT AERO Inhale 2 puffs into the lungs 2 (two) times daily. 13 g 5   . ondansetron (ZOFRAN) 4 MG tablet Take 1 tablet (4 mg total) by mouth every 8 (eight) hours as needed for nausea or vomiting. 20 tablet 0   . Spacer/Aero-Holding Chambers (AEROCHAMBER MV) inhaler Use as instructed 1 each 0   . tiotropium (SPIRIVA) 18 MCG inhalation capsule Place 18 mcg into inhaler and inhale daily as needed (for breathing).      . VENTOLIN HFA 108 (90 Base) MCG/ACT inhaler INHALE 2 PUFFS INTO THE LUNGS EVERY 6 HOURS AS NEEDED FOR WHEEZING OR SHORTNESS OF BREATH 18 g 1     Assessment: Pharmacy consulted to dose heparin in patient with atrial fibrillation and possible cardiac cath.  Patient is on apixaban prior to admission with last dose 11/18 2156.  Will start heparin infusion at time of next apixaban dose and dose based of aPTT until correlation with heparin levels.  aptt 58 subtherapeutic, HL not correlating   Goal of Therapy:  Heparin level 0.3-0.7 units/ml aPTT 66-102 seconds Monitor platelets by anticoagulation protocol: Yes   Plan:  Aptt in 6 hours  Increase heparin infusion to 1400 units/hr Check anti-Xa level q6h and daily while on heparin Continue to monitor H&H and platelets  Thomasenia Sales, PharmD, MBA, BCGP Clinical Pharmacist  08/29/2019 4:45 PM

## 2019-08-29 NOTE — Progress Notes (Signed)
Xcover Chest pain per RN Per patient chest pain , pressure over the left chest with radiation towards the left shoulder/neck, slight dyspnea.  Pt denies fever, chills, cough, palp, n/v, diarrhea, brbpr.   Exam:  T 97.9, P 83  Bp 116/68  Pox 95% on RA Heent: anicteric,  Neck: no jvd Heart: irr, irr, s1, s2 no m/g/r Lung: ctab Abd: morbidly obese, nt, nd, +bs Ext: no c/c/e,   Ekg: afib at 95, nl axis, st depression t inversion in v3-6, t inversion in 1, avl   (prior EKG showed t inversion in v4-6, and 2, 3, avf)  A/P Chest pain Tele 12 lead ekg Check cpk, mb, trop  Check lipid Check cardiac echo as ordered Aspirin was given in ED, defer to cardiology to continuation Cont Lipitor 10mg  po qhs Cont Cardizem CD Cardiology consult requested in computer

## 2019-08-29 NOTE — Progress Notes (Signed)
*  PRELIMINARY RESULTS* Echocardiogram 2D Echocardiogram has been performed.  Leavy Cella 08/29/2019, 11:16 AM

## 2019-08-30 ENCOUNTER — Encounter (HOSPITAL_COMMUNITY)
Admission: EM | Disposition: A | Discharge status: Home / Self Care | Payer: Self-pay | Source: Home / Self Care | Attending: Family Medicine

## 2019-08-30 DIAGNOSIS — R072 Precordial pain: Secondary | ICD-10-CM

## 2019-08-30 DIAGNOSIS — I5033 Acute on chronic diastolic (congestive) heart failure: Secondary | ICD-10-CM

## 2019-08-30 DIAGNOSIS — Z9989 Dependence on other enabling machines and devices: Secondary | ICD-10-CM

## 2019-08-30 DIAGNOSIS — G4733 Obstructive sleep apnea (adult) (pediatric): Secondary | ICD-10-CM

## 2019-08-30 DIAGNOSIS — R9431 Abnormal electrocardiogram [ECG] [EKG]: Secondary | ICD-10-CM

## 2019-08-30 HISTORY — PX: LEFT HEART CATH AND CORONARY ANGIOGRAPHY: CATH118249

## 2019-08-30 LAB — BASIC METABOLIC PANEL
Anion gap: 12 (ref 5–15)
BUN: 53 mg/dL — ABNORMAL HIGH (ref 6–20)
CO2: 24 mmol/L (ref 22–32)
Calcium: 9.1 mg/dL (ref 8.9–10.3)
Chloride: 97 mmol/L — ABNORMAL LOW (ref 98–111)
Creatinine, Ser: 1.45 mg/dL — ABNORMAL HIGH (ref 0.44–1.00)
GFR calc Af Amer: 46 mL/min — ABNORMAL LOW (ref >60)
GFR calc non Af Amer: 40 mL/min — ABNORMAL LOW (ref >60)
Glucose, Bld: 409 mg/dL — ABNORMAL HIGH (ref 70–99)
Potassium: 4.6 mmol/L (ref 3.5–5.1)
Sodium: 133 mmol/L — ABNORMAL LOW (ref 135–145)

## 2019-08-30 LAB — CBC
HCT: 36.5 % (ref 36.0–46.0)
Hemoglobin: 12.4 g/dL (ref 12.0–15.0)
MCH: 33.8 pg (ref 26.0–34.0)
MCHC: 34 g/dL (ref 30.0–36.0)
MCV: 99.5 fL (ref 80.0–100.0)
Platelets: 155 10*3/uL (ref 150–400)
RBC: 3.67 MIL/uL — ABNORMAL LOW (ref 3.87–5.11)
RDW: 12.2 % (ref 11.5–15.5)
WBC: 6.9 10*3/uL (ref 4.0–10.5)
nRBC: 0 % (ref 0.0–0.2)

## 2019-08-30 LAB — GLUCOSE, CAPILLARY
Glucose-Capillary: 413 mg/dL — ABNORMAL HIGH (ref 70–99)
Glucose-Capillary: 430 mg/dL — ABNORMAL HIGH (ref 70–99)
Glucose-Capillary: 225 mg/dL — ABNORMAL HIGH (ref 70–99)
Glucose-Capillary: 330 mg/dL — ABNORMAL HIGH (ref 70–99)

## 2019-08-30 LAB — APTT
aPTT: 61 seconds — ABNORMAL HIGH (ref 24–36)
aPTT: 71 seconds — ABNORMAL HIGH (ref 24–36)

## 2019-08-30 LAB — HEPARIN LEVEL (UNFRACTIONATED): Heparin Unfractionated: 1.28 IU/mL — ABNORMAL HIGH (ref 0.30–0.70)

## 2019-08-30 LAB — GLUCOSE, RANDOM: Glucose, Bld: 428 mg/dL — ABNORMAL HIGH (ref 70–99)

## 2019-08-30 SURGERY — LEFT HEART CATH AND CORONARY ANGIOGRAPHY
Anesthesia: LOCAL

## 2019-08-30 MED ORDER — IOHEXOL 350 MG/ML SOLN
INTRAVENOUS | Status: DC | PRN
  Administered 2019-08-30: 40 mL

## 2019-08-30 MED ORDER — MOMETASONE FURO-FORMOTEROL FUM 200-5 MCG/ACT IN AERO
2.0000 | INHALATION_SPRAY | Freq: Two times a day (BID) | RESPIRATORY_TRACT | Status: DC
  Administered 2019-08-30 – 2019-08-31 (×3): 2 via RESPIRATORY_TRACT
  Filled 2019-08-30: qty 8.8

## 2019-08-30 MED ORDER — ONDANSETRON HCL 4 MG/2ML IJ SOLN
4.0000 mg | Freq: Four times a day (QID) | INTRAMUSCULAR | Status: DC | PRN
  Administered 2019-08-30: 4 mg via INTRAVENOUS
  Filled 2019-08-30: qty 2

## 2019-08-30 MED ORDER — SODIUM CHLORIDE 0.9 % WEIGHT BASED INFUSION
1.0000 mL/kg/h | INTRAVENOUS | Status: DC
  Administered 2019-08-30: 1 mL/kg/h via INTRAVENOUS

## 2019-08-30 MED ORDER — HEPARIN (PORCINE) IN NACL 1000-0.9 UT/500ML-% IV SOLN
INTRAVENOUS | Status: AC
  Filled 2019-08-30: qty 1000

## 2019-08-30 MED ORDER — MIDAZOLAM HCL 2 MG/2ML IJ SOLN
INTRAMUSCULAR | Status: DC | PRN
  Administered 2019-08-30 (×2): 1 mg via INTRAVENOUS

## 2019-08-30 MED ORDER — MIDAZOLAM HCL 2 MG/2ML IJ SOLN
INTRAMUSCULAR | Status: AC
  Filled 2019-08-30: qty 2

## 2019-08-30 MED ORDER — SODIUM CHLORIDE 0.9 % IV SOLN: 250.0000 mL | INTRAVENOUS | Status: DC | PRN

## 2019-08-30 MED ORDER — HYDRALAZINE HCL 20 MG/ML IJ SOLN: 10.0000 mg | INTRAMUSCULAR | Status: AC | PRN

## 2019-08-30 MED ORDER — SODIUM CHLORIDE 0.9% FLUSH
3.0000 mL | Freq: Two times a day (BID) | INTRAVENOUS | Status: DC
  Administered 2019-08-30 (×2): 3 mL via INTRAVENOUS

## 2019-08-30 MED ORDER — HEPARIN (PORCINE) IN NACL 1000-0.9 UT/500ML-% IV SOLN
INTRAVENOUS | Status: DC | PRN
  Administered 2019-08-30 (×3): 500 mL

## 2019-08-30 MED ORDER — FENTANYL CITRATE (PF) 100 MCG/2ML IJ SOLN
INTRAMUSCULAR | Status: AC
  Filled 2019-08-30: qty 2

## 2019-08-30 MED ORDER — ACETAMINOPHEN 325 MG PO TABS: 650.0000 mg | ORAL_TABLET | ORAL | Status: DC | PRN

## 2019-08-30 MED ORDER — SODIUM CHLORIDE 0.9% FLUSH: 3.0000 mL | INTRAVENOUS | Status: DC | PRN

## 2019-08-30 MED ORDER — VERAPAMIL HCL 2.5 MG/ML IV SOLN
INTRAVENOUS | Status: AC
  Filled 2019-08-30: qty 2

## 2019-08-30 MED ORDER — SODIUM CHLORIDE 0.9 % WEIGHT BASED INFUSION
3.0000 mL/kg/h | INTRAVENOUS | Status: DC
  Administered 2019-08-30: 3 mL/kg/h via INTRAVENOUS

## 2019-08-30 MED ORDER — LABETALOL HCL 5 MG/ML IV SOLN: 10.0000 mg | INTRAVENOUS | Status: AC | PRN

## 2019-08-30 MED ORDER — FENTANYL CITRATE (PF) 100 MCG/2ML IJ SOLN
INTRAMUSCULAR | Status: DC | PRN
  Administered 2019-08-30: 50 ug via INTRAVENOUS
  Administered 2019-08-30: 25 ug via INTRAVENOUS

## 2019-08-30 MED ORDER — ONDANSETRON HCL 4 MG/2ML IJ SOLN: 4.0000 mg | Freq: Four times a day (QID) | INTRAMUSCULAR | Status: DC | PRN

## 2019-08-30 MED ORDER — SODIUM CHLORIDE 0.9% FLUSH
3.0000 mL | Freq: Two times a day (BID) | INTRAVENOUS | Status: DC
  Administered 2019-08-31: 3 mL via INTRAVENOUS

## 2019-08-30 MED ORDER — HEPARIN SODIUM (PORCINE) 1000 UNIT/ML IJ SOLN
INTRAMUSCULAR | Status: DC | PRN
  Administered 2019-08-30: 6000 [IU] via INTRAVENOUS

## 2019-08-30 MED ORDER — SODIUM CHLORIDE 0.9 % IV SOLN: INTRAVENOUS | Status: AC

## 2019-08-30 MED ORDER — LIDOCAINE HCL (PF) 1 % IJ SOLN
INTRAMUSCULAR | Status: AC
  Filled 2019-08-30: qty 30

## 2019-08-30 MED ORDER — HEPARIN SODIUM (PORCINE) 1000 UNIT/ML IJ SOLN
INTRAMUSCULAR | Status: AC
  Filled 2019-08-30: qty 1

## 2019-08-30 MED ORDER — LIDOCAINE HCL (PF) 1 % IJ SOLN
INTRAMUSCULAR | Status: DC | PRN
  Administered 2019-08-30: 2 mL

## 2019-08-30 MED ORDER — VERAPAMIL HCL 2.5 MG/ML IV SOLN
INTRAVENOUS | Status: DC | PRN
  Administered 2019-08-30: 10 mL via INTRA_ARTERIAL

## 2019-08-30 SURGICAL SUPPLY — 11 items
CATH INFINITI 5 FR JL3.5 (CATHETERS) ×1 IMPLANT
CATH INFINITI JR4 5F (CATHETERS) ×1 IMPLANT
DEVICE RAD COMP TR BAND LRG (VASCULAR PRODUCTS) ×1 IMPLANT
GLIDESHEATH SLEND SS 6F .021 (SHEATH) ×1 IMPLANT
GUIDEWIRE INQWIRE 1.5J.035X260 (WIRE) IMPLANT
INQWIRE 1.5J .035X260CM (WIRE) ×2
KIT HEART LEFT (KITS) ×2 IMPLANT
PACK CARDIAC CATHETERIZATION (CUSTOM PROCEDURE TRAY) ×2 IMPLANT
SHEATH PROBE COVER 6X72 (BAG) ×1 IMPLANT
TRANSDUCER W/STOPCOCK (MISCELLANEOUS) ×2 IMPLANT
TUBING CIL FLEX 10 FLL-RA (TUBING) ×2 IMPLANT

## 2019-08-30 NOTE — Progress Notes (Addendum)
Progress Note  Patient Name: Laura [name] Date of Encounter: 08/30/2019  Primary Cardiologist: Kate Sable, MD   Subjective   Didn't sleep well. Had a panic attack last night. Hasn't slept in days. Has chest, shoulder and back aches. Still short of breath.  Inpatient Medications    Scheduled Meds:  aspirin EC  81 mg Oral Daily   atorvastatin  40 mg Oral QHS   diltiazem  300 mg Oral q morning - 10a   doxycycline  100 mg Oral Q12H   gabapentin  600 mg Oral TID   insulin aspart  0-15 Units Subcutaneous TID WC   insulin aspart  0-5 Units Subcutaneous QHS   insulin glargine  45 Units Subcutaneous QHS   lamoTRIgine  25 mg Oral Daily   levothyroxine  112 mcg Oral Q0600   Melatonin  3 mg Oral QHS   methylPREDNISolone (SOLU-MEDROL) injection  40 mg Intravenous Q6H   mometasone-formoterol  2 puff Inhalation BID   sodium chloride flush  3 mL Intravenous Q12H   Continuous Infusions:  sodium chloride     Followed by   sodium chloride     heparin 1,250 Units/hr (08/30/19 0739)   PRN Meds: acetaminophen, albuterol, hydrOXYzine, ondansetron (ZOFRAN) IV, oxyCODONE   Vital Signs    Vitals:   08/29/19 2254 08/29/19 2311 08/30/19 0209 08/30/19 0616  BP:   (!) 110/57 (!) 119/54  Pulse: 63  63 68  Resp: 16  (!) 24   Temp:   (!) 97.5 F (36.4 C)   TempSrc:   Oral   SpO2: 95% 96% 100% 100%  Weight:    115.3 kg  Height:        Intake/Output Summary (Last 24 hours) at 08/30/2019 1010 Last data filed at 08/30/2019 0748 Gross per 24 hour  Intake 472.64 ml  Output 625 ml  Net -152.36 ml   Filed Weights   08/28/19 1949 08/29/19 0500 08/30/19 0616  Weight: 111.3 kg 112.8 kg 115.3 kg    Telemetry     Atrial fibrillation, HR 70's- Personally [place]ie[place]  Physical Exam   GEN: No acute distress.   Neck: No JVD Cardiac: Irregular rhythm, no murmurs, rubs, or gallops.  Respiratory: Clear to auscultation bilaterally. GI: Soft, nontender, non-distended    MS: No edema; No deformity. Neuro:  Nonfocal  Psych: Normal affect   Labs    Chemistry Recent Labs  Lab 08/28/19 1517 08/29/19 0718 08/29/19 2151 08/30/19 0538 08/30/19 0850  NA 143 138  --  133*  --   K 4.4 5.8*  --  4.6  --   CL 107 101  --  97*  --   CO2 25 24  --  24  --   GLUCOSE 142* 234* 441* 409* 428*  BUN 25* 37*  --  53*  --   CREATININE 1.17* 1.38*  --  1.45*  --   CALCIUM 9.2 9.0  --  9.1  --   PROT 7.5  --   --   --   --   ALBUMIN 3.8  --   --   --   --   AST 16  --   --   --   --   ALT 13  --   --   --   --   ALKPHOS 52  --   --   --   --   BILITOT 0.8  --   --   --   --   GFRNONAA 52*  42*  --  40*  --   GFRAA 60* 49*  --  46*  --   ANIONGAP 11 13  --  12  --      Hematology Recent Labs  Lab 08/28/19 1517 08/29/19 0718 08/30/19 0538  WBC 6.6 3.6* 6.9  RBC 4.06 3.98 3.67*  HGB 13.5 13.0 12.4  HCT 40.4 40.0 36.5  MCV 99.5 100.5* 99.5  MCH 33.3 32.7 33.8  MCHC 33.4 32.5 34.0  RDW 12.4 12.4 12.2  PLT 163 149* 155    Cardiac EnzymesNo results for input(s): TROPONINI in the last 168 hours. No results for input(s): TROPIPOC in the last 168 hours.   BNP Recent Labs  Lab 08/28/19 1517  BNP 715.0*     DDimer No results for input(s): DDIMER in the last 168 hours.   Radiology    Dg Chest Portable 1 View  Result Date: 08/28/2019 CLINICAL DATA:  58 year old female with shortness of breath. EXAM: PORTABLE CHEST 1 VIEW COMPARISON:  Chest radiograph dated 08/10/2018. FINDINGS: Mild diffuse interstitial prominence similar to prior radiograph. No focal consolidation, pleural effusion, pneumothorax. The cardiac silhouette is within normal limits. Atherosclerotic calcification of the aortic arch. No acute osseous pathology. IMPRESSION: 1. No acute cardiopulmonary process. 2. Stable mild interstitial prominence. Electronically Signed   By: Anner Crete M.D.   On: 08/28/2019 16:13    Cardiac Studies   Echocardiogram 09/08/19:   1. Left ventricular  ejection fraction, by visual estimation, is 60 to 65%. The left ventricle has normal function. There is mildly increased left ventricular hypertrophy.  2. Elevated left ventricular end-diastolic pressure.  3. Left ventricular diastolic function could not be evaluated.  4. Global right ventricle has mildly reduced systolic function.The right ventricular size is normal. No increase in right ventricular wall thickness.  5. Left atrial size was mild-moderately dilated.  6. Right atrial size was normal.  7. Moderate to severe mitral annular calcification.  8. The mitral valve is degenerative. Trace mitral valve regurgitation.  9. The tricuspid valve is grossly normal. Tricuspid valve regurgitation is trivial. 10. The aortic valve is tricuspid. Aortic valve regurgitation is not visualized. Mild aortic valve sclerosis without stenosis. 11. The pulmonic valve was grossly normal. Pulmonic valve regurgitation is not visualized. 12. The inferior vena cava is normal in size with greater than 50% respiratory variability, suggesting right atrial pressure of 3 mmHg.  Patient Profile     58 y.o. female with past medical history of paroxysmal atrial fibrillation (s/p DCCV in early 2000's, recurrent atrial flutter in 07/2018 and successful DCCV in 08/2018), HTN, HLD, IDDM, severe asthma, OSA, and history of ASD repair (echo in 08/2018 showing no significant shunting) who is being seen today for the evaluation of chest pain and abnormal EKG at the request of Dr. Maudie Mercury.   Assessment & Plan    1. Pleuritic Chest Pain/ Abnormal EKG - presents with new onset symptoms starting Monday which initially presented as nausea but she reports a constellation of symptoms including body aches, subjective fevers, chills and headaches. She did develop acute dyspnea 2 days ago with associated discomfort along her left pectoral region. She is still having episodes of chest discomfort which presents with deep breathing or coughing.   Denies any exertional symptoms. COVID negative. - Initial HS Troponin 114 with repeat of 95 and 52. CRP elevated to 5.8. EKG on admission showed atrial fibrillation, HR 75 with diffuse TWI along the inferior and lateral leads which was new when compared to prior  tracings.  - Overall, her symptoms appear more consistent with a pleuritic etiology but she does have significant EKG changes when compared to prior tracings. Echo showed normal LV function and wall motion. Brother underwent 4-v CABG in his late 71's.  Will arrange for cardiac catheterization for definitive evaluation. Eliquis on hold. Continue IV heparin. Continue ASA and statin therapy.  Risks and benefits of cardiac catheterization have been discussed with the patient.  These include bleeding, infection, kidney damage, stroke, heart attack, death.  The patient understands these risks and is willing to proceed.  2. Acute diastolic CHF Exacerbation - She reports worsening dyspnea on exertion leading up to admission and BNP was elevated to 715. She did receive IV Lasix 40 mg in the ED and an additional 20mg .  Creatinine up to 1.45. No further diuresis.  3. Paroxysmal Atrial Fibrillation - s/p DCCV in early 2000's, recurrent atrial flutter in 07/2018 and successful DCCV in 08/2018. - found to be in recurrent atrial fibrillation this admission but was overall asymptomatic. Once she recovers from her acute illness and has been resumed on uninterrupted anticoagulation for 3 weeks, could consider repeat DCCV.  - remains on Cardizem CD 300mg  daily for rate-control.  4. History of ASD Repair - echo yesterday showed no significant shunting.   5. Hyperkalemia - K+ 5.8 yesterday, down to 4.6.     For questions or updates, please contact [place] Please consult [email or web address] for contact info under Cardiology/STEMI.      Signed, Kate Sable, MD  08/30/2019, 10:10 AM

## 2019-08-30 NOTE — Progress Notes (Signed)
Inpatient Diabetes Program Recommendations  AACE/ADA: New Consensus Statement on Inpatient Glycemic Control (2015)  Target Ranges:  Prepandial:   less than 140 mg/dL      Peak postprandial:   less than 180 mg/dL (1-2 hours)      Critically ill patients:  140 - 180 mg/dL   Results for DRUCELLA, OCH" (MRN [id number]) as of 08/30/2019 14:18  Ref. Range 08/29/2019 07:44 08/29/2019 12:18 08/29/2019 16:23 08/29/2019 21:14  Glucose-Capillary Latest Ref Range: 70 - 99 mg/dL 222 (H)  5 units NOVOLOG  275 (H)  8 units NOVOLOG  338 (H)  11 units NOVOLOG  468 (H)  10 units NOVOLOG +  45 units LANTUS   Results for NALDA, FIALLOS" (MRN [id number]) as of 08/30/2019 14:18  Ref. Range 08/30/2019 07:55 08/30/2019 11:56  Glucose-Capillary Latest Ref Range: 70 - 99 mg/dL 413 (H)  15 units NOVOLOG  430 (H)  15  units NOVOLOG     Admit with: CP/ SOB/ CHF  History: DM, CHF  Home DM Meds: Trulicity 1.5 mg Qweek       Humalog 7 units TID       Lantus 60 units QHS       Metformin 1000 mg BID  Current Orders: Novolog Moderate Correction Scale/ SSI (0-15 units) TID AC + HS      Lantus 45 units QHS      MD- Note patient getting Solumedrol 40 mg Q6 hours.  CBGs 400s today.  Please consider the following adjustments:  1. Increase Lantus to 60 units QHS (home dose)  2. Start Novolog Meal Coverage: Novolog 7 units TID with meals (home dose)  3. Increase Novolog SSI to the Resistant scale (0-20 units) TID AC + HS      --Will follow patient during hospitalization--  Wyn Quaker RN, MSN, CDE Diabetes Coordinator Inpatient Glycemic Control Team Team Pager: [phone] (8a-5p)

## 2019-08-30 NOTE — H&P (View-Only) (Signed)
Progress Note  Patient Name: Laura [name] Date of Encounter: 08/30/2019  Primary Cardiologist: Kate Sable, MD   Subjective   Didn't sleep well. Had a panic attack last night. Hasn't slept in days. Has chest, shoulder and back aches. Still short of breath.  Inpatient Medications    Scheduled Meds:  aspirin EC  81 mg Oral Daily   atorvastatin  40 mg Oral QHS   diltiazem  300 mg Oral q morning - 10a   doxycycline  100 mg Oral Q12H   gabapentin  600 mg Oral TID   insulin aspart  0-15 Units Subcutaneous TID WC   insulin aspart  0-5 Units Subcutaneous QHS   insulin glargine  45 Units Subcutaneous QHS   lamoTRIgine  25 mg Oral Daily   levothyroxine  112 mcg Oral Q0600   Melatonin  3 mg Oral QHS   methylPREDNISolone (SOLU-MEDROL) injection  40 mg Intravenous Q6H   mometasone-formoterol  2 puff Inhalation BID   sodium chloride flush  3 mL Intravenous Q12H   Continuous Infusions:  sodium chloride     Followed by   sodium chloride     heparin 1,250 Units/hr (08/30/19 0739)   PRN Meds: acetaminophen, albuterol, hydrOXYzine, ondansetron (ZOFRAN) IV, oxyCODONE   Vital Signs    Vitals:   08/29/19 2254 08/29/19 2311 08/30/19 0209 08/30/19 0616  BP:   (!) 110/57 (!) 119/54  Pulse: 63  63 68  Resp: 16  (!) 24   Temp:   (!) 97.5 F (36.4 C)   TempSrc:   Oral   SpO2: 95% 96% 100% 100%  Weight:    115.3 kg  Height:        Intake/Output Summary (Last 24 hours) at 08/30/2019 1010 Last data filed at 08/30/2019 0748 Gross per 24 hour  Intake 472.64 ml  Output 625 ml  Net -152.36 ml   Filed Weights   08/28/19 1949 08/29/19 0500 08/30/19 0616  Weight: 111.3 kg 112.8 kg 115.3 kg    Telemetry     Atrial fibrillation, HR 70's- Personally Reviewe  Physical Exam   GEN: No acute distress.   Neck: No JVD Cardiac: Irregular rhythm, no murmurs, rubs, or gallops.  Respiratory: Clear to auscultation bilaterally. GI: Soft, nontender, non-distended    MS: No edema; No deformity. Neuro:  Nonfocal  Psych: Normal affect   Labs    Chemistry Recent Labs  Lab 08/28/19 1517 08/29/19 0718 08/29/19 2151 08/30/19 0538 08/30/19 0850  NA 143 138  --  133*  --   K 4.4 5.8*  --  4.6  --   CL 107 101  --  97*  --   CO2 25 24  --  24  --   GLUCOSE 142* 234* 441* 409* 428*  BUN 25* 37*  --  53*  --   CREATININE 1.17* 1.38*  --  1.45*  --   CALCIUM 9.2 9.0  --  9.1  --   PROT 7.5  --   --   --   --   ALBUMIN 3.8  --   --   --   --   AST 16  --   --   --   --   ALT 13  --   --   --   --   ALKPHOS 52  --   --   --   --   BILITOT 0.8  --   --   --   --   GFRNONAA 52*  42*  --  40*  --   GFRAA 60* 49*  --  46*  --   ANIONGAP 11 13  --  12  --      Hematology Recent Labs  Lab 08/28/19 1517 08/29/19 0718 08/30/19 0538  WBC 6.6 3.6* 6.9  RBC 4.06 3.98 3.67*  HGB 13.5 13.0 12.4  HCT 40.4 40.0 36.5  MCV 99.5 100.5* 99.5  MCH 33.3 32.7 33.8  MCHC 33.4 32.5 34.0  RDW 12.4 12.4 12.2  PLT 163 149* 155    Cardiac EnzymesNo results for input(s): TROPONINI in the last 168 hours. No results for input(s): TROPIPOC in the last 168 hours.   BNP Recent Labs  Lab 08/28/19 1517  BNP 715.0*     DDimer No results for input(s): DDIMER in the last 168 hours.   Radiology    Dg Chest Portable 1 View  Result Date: 08/28/2019 CLINICAL DATA:  58 year old female with shortness of breath. EXAM: PORTABLE CHEST 1 VIEW COMPARISON:  Chest radiograph dated 08/10/2018. FINDINGS: Mild diffuse interstitial prominence similar to prior radiograph. No focal consolidation, pleural effusion, pneumothorax. The cardiac silhouette is within normal limits. Atherosclerotic calcification of the aortic arch. No acute osseous pathology. IMPRESSION: 1. No acute cardiopulmonary process. 2. Stable mild interstitial prominence. Electronically Signed   By: Anner Crete M.D.   On: 08/28/2019 16:13    Cardiac Studies   Echocardiogram 09/08/19:   1. Left ventricular  ejection fraction, by visual estimation, is 60 to 65%. The left ventricle has normal function. There is mildly increased left ventricular hypertrophy.  2. Elevated left ventricular end-diastolic pressure.  3. Left ventricular diastolic function could not be evaluated.  4. Global right ventricle has mildly reduced systolic function.The right ventricular size is normal. No increase in right ventricular wall thickness.  5. Left atrial size was mild-moderately dilated.  6. Right atrial size was normal.  7. Moderate to severe mitral annular calcification.  8. The mitral valve is degenerative. Trace mitral valve regurgitation.  9. The tricuspid valve is grossly normal. Tricuspid valve regurgitation is trivial. 10. The aortic valve is tricuspid. Aortic valve regurgitation is not visualized. Mild aortic valve sclerosis without stenosis. 11. The pulmonic valve was grossly normal. Pulmonic valve regurgitation is not visualized. 12. The inferior vena cava is normal in size with greater than 50% respiratory variability, suggesting right atrial pressure of 3 mmHg.  Patient Profile     58 y.o. female with past medical history of paroxysmal atrial fibrillation (s/p DCCV in early 2000's, recurrent atrial flutter in 07/2018 and successful DCCV in 08/2018), HTN, HLD, IDDM, severe asthma, OSA, and history of ASD repair (echo in 08/2018 showing no significant shunting) who is being seen today for the evaluation of chest pain and abnormal EKG at the request of Dr. Maudie Mercury.   Assessment & Plan    1. Pleuritic Chest Pain/ Abnormal EKG - presents with new onset symptoms starting Monday which initially presented as nausea but she reports a constellation of symptoms including body aches, subjective fevers, chills and headaches. She did develop acute dyspnea 2 days ago with associated discomfort along her left pectoral region. She is still having episodes of chest discomfort which presents with deep breathing or coughing.   Denies any exertional symptoms. COVID negative. - Initial HS Troponin 114 with repeat of 95 and 52. CRP elevated to 5.8. EKG on admission showed atrial fibrillation, HR 75 with diffuse TWI along the inferior and lateral leads which was new when compared to prior  tracings.  - Overall, her symptoms appear more consistent with a pleuritic etiology but she does have significant EKG changes when compared to prior tracings. Echo showed normal LV function and wall motion. Brother underwent 4-v CABG in his late 70's.  Will arrange for cardiac catheterization for definitive evaluation. Eliquis on hold. Continue IV heparin. Continue ASA and statin therapy.  Risks and benefits of cardiac catheterization have been discussed with the patient.  These include bleeding, infection, kidney damage, stroke, heart attack, death.  The patient understands these risks and is willing to proceed.  2. Acute diastolic CHF Exacerbation - She reports worsening dyspnea on exertion leading up to admission and BNP was elevated to 715. She did receive IV Lasix 40 mg in the ED and an additional 20mg .  Creatinine up to 1.45. No further diuresis.  3. Paroxysmal Atrial Fibrillation - s/p DCCV in early 2000's, recurrent atrial flutter in 07/2018 and successful DCCV in 08/2018. - found to be in recurrent atrial fibrillation this admission but was overall asymptomatic. Once she recovers from her acute illness and has been resumed on uninterrupted anticoagulation for 3 weeks, could consider repeat DCCV.  - remains on Cardizem CD 300mg  daily for rate-control.  4. History of ASD Repair - echo yesterday showed no significant shunting.   5. Hyperkalemia - K+ 5.8 yesterday, down to 4.6.     For questions or updates, please contact [place] Please consult [email or web address] for contact info under Cardiology/STEMI.      Signed, Kate Sable, MD  08/30/2019, 10:10 AM

## 2019-08-30 NOTE — Progress Notes (Addendum)
ANTICOAGULATION CONSULT NOTE -  Pharmacy Consult for heparin Indication: atrial fibrillation/abnormal EKG  Allergies  Allergen Reactions  . Ativan [Lorazepam] Hives  . Phenergan [Promethazine Hcl] Hives    Patient Measurements: Height: 5\' 4"  (162.6 cm) Weight: 254 lb 3.1 oz (115.3 kg) IBW/kg (Calculated) : 54.7 Heparin Dosing Weight: 82 kg  Vital Signs: Temp: 97.5 F (36.4 C) (11/20 0209) Temp Source: Oral (11/20 0209) BP: 119/54 (11/20 0616) Pulse Rate: 68 (11/20 0616)  Labs: Recent Labs    08/28/19 1517 08/29/19 0104 08/29/19 0718 08/29/19 1040 08/29/19 1510 08/30/19 0024 08/30/19 0538 08/30/19 0849  HGB 13.5  --  13.0  --   --   --  12.4  --   HCT 40.4  --  40.0  --   --   --  36.5  --   PLT 163  --  149*  --   --   --  155  --   APTT  --   --   --   --  58* 61*  --  71*  HEPARINUNFRC  --   --   --  1.66*  --   --  1.28*  --   CREATININE 1.17*  --  1.38*  --   --   --  1.45*  --   CKTOTAL  --  77  --   --   --   --   --   --   CKMB  --  3.2  --   --   --   --   --   --   TROPONINIHS 114* 95* 52*  --   --   --   --   --     Estimated Creatinine Clearance: 53.3 mL/min (A) (by C-G formula based on SCr of 1.45 mg/dL (H)).    Assessment: Pharmacy consulted to dose heparin in patient with atrial fibrillation and possible cardiac cath.  Patient is on apixaban prior to admission with last dose 11/18 2156.  Will start heparin infusion at time of next apixaban dose and dose based of aPTT until correlation with heparin levels.   APTT= 71 seconds,therapeutic   Goal of Therapy:  Heparin level 0.3-0.7 units/ml aPTT 66-102 seconds Monitor platelets by anticoagulation protocol: Yes   Plan:  Continue heparin infusion at 1250 units/hr Check aptt in 6 hours  Check anti-Xa level  daily while on heparin Continue to monitor H&H and platelets  Margot Ables, PharmD Clinical Pharmacist 08/30/2019 9:19 AM

## 2019-08-30 NOTE — Care Management Important Message (Signed)
 -  Cardiology service is transferring sent to [place]  for [facility] -As per cardiology team at [facility] patient is being transferred to cardiology service, so hospitalist service has signed off  Roxan Hockey, MD

## 2019-08-30 NOTE — Progress Notes (Signed)
Patient Demographics:    Laura [name], is a 58 y.o. female, DOB - 1961-06-06, [id number]  Admit date - 08/28/2019   Admitting Physician Rhetta Mura, DO  Outpatient Primary MD for the patient is [name], MD  LOS - 2  Chief Complaint  Patient presents with  . Shortness of Breath        Subjective:    Laura [name] today has no fevers, no emesis,   -Pleuritic type left-sided chest discomfort and dyspnea persist, - Complains of anxiety and insomnia related problems -Elevated glucose noted  Assessment  & Plan :    Principal Problem:   Acute on chronic diastolic CHF (congestive heart failure) (HCC) Active Problems:   Uncontrolled type 2 diabetes mellitus with hyperglycemia (HCC)   Hypothyroidism   Essential hypertension, benign   OSA on CPAP   Anxiety   Chest pain   Shortness of breath  Brief Summary  58 y.o. female with medical history significant for dCHF, severe concentric LVH with EF 65 to 70%, PAFIb/Flutter chronically anticoagulated on Eliquis, DM2, morbid obesity with OSA on home nocturnal CPAP, moderate persistent asthma, HTN, who is admitted to [facility] on 08/28/2019 with suspected acute on chronic diastolic heart failure as well as pleuritic chest discomfort   A/p 1)Atypical chest pains--- EKG with TWI , cardiology consult appreciated, PTA patient was on Eliquis, currently on IV heparin -Echocardiogram noted with preserved EF at 60 to 65% -Low clinical index of suspicion for acute PE -Cardiology service is transferring sent to [place]  for [facility] --Continue aspirin, as well as Lipitor  2)HFpEF--- acute reserved EF CHF exacerbation, EF 60 to 65%, improved with IV Lasix, BNP was 715 with significant dyspnea on exertion -Cardiologist recommends holding further IV Lasix for now as creatinine is up to 1.45 from a baseline around 1 -Continue to monitor  daily weight and fluid input and output charting -Given viral syndrome cannot rule out possible viral induced cardiomyopathy -As per cardiologist patient needs LHC to rule out ischemic cardiomyopathy  3)PAF/Flutter --- - s/p DCCV in early 2000's, recurrent atrial flutter in 07/2018 and successful DCCV in 08/2018. -May need another cardioversion in the near future -Continue IV heparin for now Eliquis on hold to allow for  LHC -Continue Cardizem CD 300 mg daily cfor rate control  4)DM2-A1c 6.6, reflecting excellent diabetic control PTA, continue Lantus insulin at 45 units nightly,  -Worsening hyperglycemia is due to steroids Use Novolog/Humalog Sliding scale insulin with Accu-Cheks/Fingersticks as ordered   5)Morbid obesity/OSA----Covid is negative, okay to use CPAP nightly  6) acute bronchitis--doxycycline as ordered, albuterol as needed.  Continue Dulera -Currently on IV Solu-Medrol  7)H/o ASD repair-echo without significant shunt  8) hypothyroidism--stable, continue levothyroxine 1 1 2  mcg daily  9) depression/anxiety--stable, patient had panic attack overnight continue Lamictal 25 mg daily,, may use hydroxyzine for anxiety, melatonin for sleep  10)AKI----acute kidney injury due to IV Lasix/diuresis-     creatinine on admission=1.1  ,   baseline creatinine = 1.0   , creatinine is now=1.45     , renally adjust medications, avoid nephrotoxic agents/dehydration/hypotension -Hold Lasix, potassium is down to 4.6 from 5.8, repeat BMP in a.m.,   Disposition/Need for in-Hospital Stay- patient unable to  be discharged at this time due to --chest pain requiring further work-up including IV heparin-- Cardiology service is transferring sent to [place]  for [facility]  Code Status : Full  Family Communication:   NA (patient is alert, awake and coherent)  Disposition Plan  : -Cardiology service is transferring sent to [place]  for [place]  :  cardiology  DVT Prophylaxis  :  IV heparin  - SCD  Lab Results  Component Value Date   PLT 155 08/30/2019    Inpatient Medications  Scheduled Meds: . aspirin EC  81 mg Oral Daily  . atorvastatin  40 mg Oral QHS  . diltiazem  300 mg Oral q morning - 10a  . doxycycline  100 mg Oral Q12H  . gabapentin  600 mg Oral TID  . insulin aspart  0-15 Units Subcutaneous TID WC  . insulin aspart  0-5 Units Subcutaneous QHS  . insulin glargine  45 Units Subcutaneous QHS  . lamoTRIgine  25 mg Oral Daily  . levothyroxine  112 mcg Oral Q0600  . Melatonin  3 mg Oral QHS  . methylPREDNISolone (SOLU-MEDROL) injection  40 mg Intravenous Q6H  . mometasone-formoterol  2 puff Inhalation BID  . sodium chloride flush  3 mL Intravenous Q12H  . sodium chloride flush  3 mL Intravenous Q12H   Continuous Infusions: . sodium chloride    . heparin 1,250 Units/hr (08/30/19 0739)   PRN Meds:.acetaminophen, albuterol, hydrOXYzine, ondansetron (ZOFRAN) IV, oxyCODONE    Anti-infectives (From admission, onward)   Start     Dose/Rate Route Frequency Ordered Stop   08/28/19 2200  doxycycline (VIBRA-TABS) tablet 100 mg     100 mg Oral Every 12 hours 08/28/19 2113          Objective:   Vitals:   08/29/19 2311 08/30/19 0209 08/30/19 0616 08/30/19 1046  BP:  (!) 110/57 (!) 119/54   Pulse:  63 68   Resp:  (!) 24    Temp:  (!) 97.5 F (36.4 C)    TempSrc:  Oral    SpO2: 96% 100% 100% 98%  Weight:   115.3 kg   Height:        Wt Readings from Last 3 Encounters:  08/30/19 115.3 kg  07/14/19 114 kg  06/28/19 114.8 kg     Intake/Output Summary (Last 24 hours) at 08/30/2019 1243 Last data filed at 08/30/2019 0748 Gross per 24 hour  Intake 472.64 ml  Output 625 ml  Net -152.36 ml     Physical Exam  Gen:- Awake Alert, morbidly obese, in no acute distress HEENT:- [place].AT, No sclera icterus Neck-Supple Neck,No JVD,.  Lungs-  CTAB , fair symmetrical air movement,  CV- S1, S2 normal, irregularly irregular, left-sided chest wall  tenderness is reproducible Abd-  +ve B.Sounds, Abd Soft, No tenderness, increased truncal adiposity Extremity/Skin:- No  edema, pedal pulses present Psych-affect is appropriate, oriented x3 Neuro-no new focal deficits, no tremors   Data Review:   Micro Results Recent Results (from the past 240 hour(s))  SARS CORONAVIRUS 2 (TAT 6-24 HRS) Nasopharyngeal Nasopharyngeal Swab     Status: None   Collection Time: 08/28/19  7:13 PM   Specimen: Nasopharyngeal Swab  Result Value Ref Range Status   SARS Coronavirus 2 NEGATIVE NEGATIVE Final    Comment: (NOTE) SARS-CoV-2 target nucleic acids are NOT DETECTED. The SARS-CoV-2 RNA is generally detectable in upper and lower respiratory specimens during the acute phase of infection. Negative results do not  preclude SARS-CoV-2 infection, do not rule out co-infections with other pathogens, and should not be used as the sole basis for treatment or other patient management decisions. Negative results must be combined with clinical observations, patient history, and epidemiological information. The expected result is Negative. Fact Sheet for Patients: SugarRoll.be Fact Sheet for Healthcare Providers: https://www.woods-mathews.com/ This test is not yet approved or cleared by the [place] FDA and  has been authorized for detection and/or diagnosis of SARS-CoV-2 by FDA under an Emergency Use Authorization (EUA). This EUA will remain  in effect (meaning this test can be used) for the duration of the COVID-19 declaration under Section 56 4(b)(1) of the Act, 21 U.S.C. section 360bbb-3(b)(1), unless the authorization is terminated or revoked sooner. Performed at [facility], [place] [address], [place], [place] [address]     Radiology Reports Mr Knee Left Wo Contrast  Result Date: 08/23/2019 CLINICAL DATA:  Posterior left knee pain for 3 weeks. EXAM: MRI OF THE LEFT KNEE WITHOUT CONTRAST TECHNIQUE:  Multiplanar, multisequence MR imaging of the knee was performed. No intravenous contrast was administered. COMPARISON:  None. FINDINGS: MENISCI Medial meniscus:  Intact. Lateral meniscus:  Intact. LIGAMENTS Cruciates:  Complete chronic ACL tear.  Intact PCL. Collaterals: Medial collateral ligament is intact. Lateral collateral ligament complex is intact. CARTILAGE Patellofemoral: High-grade partial-thickness cartilage loss with areas of full-thickness cartilage loss of the lateral patellar facet. Partial-thickness cartilage loss throughout the remainder of the patellofemoral compartment. Medial: Mild partial-thickness cartilage loss of the medial femorotibial compartment. Lateral: Partial-thickness cartilage loss of the lateral femorotibial compartment. Joint: Large joint effusion. Normal Hoffa's fat. No plical thickening. Popliteal Fossa:  Tiny Baker's cyst.  Intact popliteus tendon. Extensor Mechanism: Intact quadriceps tendon. Intact patellar tendon. Intact medial patellar retinaculum. Intact lateral patellar retinaculum. Intact MPFL. Bones:  No acute osseous abnormality.  No aggressive osseous lesion. Other: No fluid collection or hematoma.  Muscles are normal. IMPRESSION: 1. Complete chronic ACL tear. 2. Tricompartmental cartilage abnormalities as described above. Electronically Signed   By: Kathreen Devoid   On: 08/23/2019 08:50   Dg Chest Portable 1 View  Result Date: 08/28/2019 CLINICAL DATA:  58 year old female with shortness of breath. EXAM: PORTABLE CHEST 1 VIEW COMPARISON:  Chest radiograph dated 08/10/2018. FINDINGS: Mild diffuse interstitial prominence similar to prior radiograph. No focal consolidation, pleural effusion, pneumothorax. The cardiac silhouette is within normal limits. Atherosclerotic calcification of the aortic arch. No acute osseous pathology. IMPRESSION: 1. No acute cardiopulmonary process. 2. Stable mild interstitial prominence. Electronically Signed   By: Anner Crete M.D.    On: 08/28/2019 16:13     CBC Recent Labs  Lab 08/28/19 1517 08/29/19 0718 08/30/19 0538  WBC 6.6 3.6* 6.9  HGB 13.5 13.0 12.4  HCT 40.4 40.0 36.5  PLT 163 149* 155  MCV 99.5 100.5* 99.5  MCH 33.3 32.7 33.8  MCHC 33.4 32.5 34.0  RDW 12.4 12.4 12.2  LYMPHSABS 2.3 0.7  --   MONOABS 0.4 0.1  --   EOSABS 0.0 0.0  --   BASOSABS 0.0 0.0  --     Chemistries  Recent Labs  Lab 08/28/19 1517 08/29/19 0718 08/29/19 2151 08/30/19 0538 08/30/19 0850  NA 143 138  --  133*  --   K 4.4 5.8*  --  4.6  --   CL 107 101  --  97*  --   CO2 25 24  --  24  --   GLUCOSE 142* 234* 441* 409* 428*  BUN 25*  37*  --  53*  --   CREATININE 1.17* 1.38*  --  1.45*  --   CALCIUM 9.2 9.0  --  9.1  --   MG 2.0 2.0  --   --   --   AST 16  --   --   --   --   ALT 13  --   --   --   --   ALKPHOS 52  --   --   --   --   BILITOT 0.8  --   --   --   --    ------------------------------------------------------------------------------------------------------------------ Recent Labs    08/29/19 0718  CHOL 137  HDL 52  LDLCALC 65  TRIG 101  CHOLHDL 2.6    Lab Results  Component Value Date   HGBA1C 6.6 (H) 08/28/2019   ------------------------------------------------------------------------------------------------------------------ Recent Labs    08/29/19 0718  TSH 0.489   ------------------------------------------------------------------------------------------------------------------ No results for input(s): VITAMINB12, FOLATE, FERRITIN, TIBC, IRON, RETICCTPCT in the last 72 hours.  Coagulation profile No results for input(s): INR, PROTIME in the last 168 hours.  No results for input(s): DDIMER in the last 72 hours.  Cardiac Enzymes Recent Labs  Lab 08/29/19 0104  CKMB 3.2   ------------------------------------------------------------------------------------------------------------------    Component Value Date/Time   BNP 715.0 (H) 08/28/2019 1517     Roxan Hockey M.D on  08/30/2019 at 12:43 PM  Go to [email or web address] - for contact info  [facility] - Office  [phone]

## 2019-08-30 NOTE — Progress Notes (Signed)
ANTICOAGULATION CONSULT NOTE - Initial Consult  Pharmacy Consult for heparin Indication: atrial fibrillation/abnormal EKG  Allergies  Allergen Reactions  . Ativan [Lorazepam] Hives  . Phenergan [Promethazine Hcl] Hives    Patient Measurements: Height: 5\' 4"  (162.6 cm) Weight: 248 lb 10.9 oz (112.8 kg) IBW/kg (Calculated) : 54.7 Heparin Dosing Weight: 82 kg  Vital Signs: Temp: 97.5 F (36.4 C) (11/20 0209) Temp Source: Oral (11/20 0209) BP: 110/57 (11/20 0209) Pulse Rate: 63 (11/20 0209)  Labs: Recent Labs    08/28/19 1517 08/29/19 0104 08/29/19 0718 08/29/19 1040 08/29/19 1510 08/30/19 0024  HGB 13.5  --  13.0  --   --   --   HCT 40.4  --  40.0  --   --   --   PLT 163  --  149*  --   --   --   APTT  --   --   --   --  58* 61*  HEPARINUNFRC  --   --   --  1.66*  --   --   CREATININE 1.17*  --  1.38*  --   --   --   CKTOTAL  --  77  --   --   --   --   CKMB  --  3.2  --   --   --   --   TROPONINIHS 114* 95* 52*  --   --   --     Estimated Creatinine Clearance: 55.3 mL/min (A) (by C-G formula based on SCr of 1.38 mg/dL (H)).    Assessment: Pharmacy consulted to dose heparin in patient with atrial fibrillation and possible cardiac cath.  Patient is on apixaban prior to admission with last dose 11/18 2156.  Will start heparin infusion at time of next apixaban dose and dose based of aPTT until correlation with heparin levels.  08/30/2019 0245 UPDATE APTT= 61 seconds, just slightly below desired therapeutic goal range Infusion was to have been increased to 1400 units/hr at ~1700 on 11-19 (per pharmacist note) but the order wasn't changed. The infusion   has been running at 1200 units/hr since initiation of order per [employer].  Goal of Therapy:  Heparin level 0.3-0.7 units/ml aPTT 66-102 seconds Monitor platelets by anticoagulation protocol: Yes   Plan:   Increase heparin infusion to 1250 units/hr Re-check aPTT at 0830 on 08-30-19 Check anti-Xa level daily while  on heparin Continue to monitor H&H and platelets  Despina Pole, Pharm. D. Clinical Pharmacist 08/30/2019 2:49 AM

## 2019-08-30 NOTE — Interval H&P Note (Signed)
Cath Lab Visit (complete for each Cath Lab visit)  Clinical Evaluation Leading to the Procedure:   ACS: Yes.    Non-ACS:    Anginal Classification: CCS IV  Anti-ischemic medical therapy: Minimal Therapy (1 class of medications)  Non-Invasive Test Results: No non-invasive testing performed  Prior CABG: No previous CABG      History and Physical Interval Note:  08/30/2019 2:41 PM  Laura [name]  has presented today for surgery, with the diagnosis of Abnormal EKG and chest pain.  The various methods of treatment have been discussed with the patient and family. After consideration of risks, benefits and other options for treatment, the patient has consented to  Procedure(s): LEFT HEART CATH AND CORONARY ANGIOGRAPHY (N/A) as a surgical intervention.  The patient's history has been reviewed, patient examined, no change in status, stable for surgery.  I have reviewed the patient's chart and labs.  Questions were answered to the patient's satisfaction.     Larae Grooms

## 2019-08-31 ENCOUNTER — Other Ambulatory Visit: Payer: Self-pay | Admitting: Physician Assistant

## 2019-08-31 DIAGNOSIS — R079 Chest pain, unspecified: Secondary | ICD-10-CM

## 2019-08-31 LAB — BASIC METABOLIC PANEL
Anion gap: 13 (ref 5–15)
BUN: 59 mg/dL — ABNORMAL HIGH (ref 6–20)
CO2: 20 mmol/L — ABNORMAL LOW (ref 22–32)
Calcium: 8.7 mg/dL — ABNORMAL LOW (ref 8.9–10.3)
Chloride: 97 mmol/L — ABNORMAL LOW (ref 98–111)
Creatinine, Ser: 1.29 mg/dL — ABNORMAL HIGH (ref 0.44–1.00)
GFR calc Af Amer: 53 mL/min — ABNORMAL LOW (ref >60)
GFR calc non Af Amer: 46 mL/min — ABNORMAL LOW (ref >60)
Glucose, Bld: 577 mg/dL (ref 70–99)
Potassium: 4.4 mmol/L (ref 3.5–5.1)
Sodium: 130 mmol/L — ABNORMAL LOW (ref 135–145)

## 2019-08-31 LAB — GLUCOSE, CAPILLARY
Glucose-Capillary: 448 mg/dL — ABNORMAL HIGH (ref 70–99)
Glucose-Capillary: 455 mg/dL — ABNORMAL HIGH (ref 70–99)
Glucose-Capillary: 582 mg/dL (ref 70–99)

## 2019-08-31 MED ORDER — INSULIN ASPART 100 UNIT/ML ~~LOC~~ SOLN
5.0000 [IU] | Freq: Once | SUBCUTANEOUS | Status: AC
  Administered 2019-08-31: 5 [IU] via SUBCUTANEOUS

## 2019-08-31 MED ORDER — FUROSEMIDE 40 MG PO TABS: 40.0000 mg | ORAL_TABLET | Freq: Every day | ORAL | 1 refills | Status: DC

## 2019-08-31 MED ORDER — DOXYCYCLINE HYCLATE 100 MG PO TABS: 100.0000 mg | ORAL_TABLET | Freq: Two times a day (BID) | ORAL | 0 refills | Status: DC

## 2019-08-31 NOTE — Progress Notes (Signed)
  CBG before breakfast was 455.  Administered 15 units.  Recheck was 448.  Cardiology made aware.  Idolina Primer, RN

## 2019-08-31 NOTE — Progress Notes (Signed)
Subjective:  Denies SSCP, palpitations or Dyspnea Initially didn't want to go home but no clinical reason to keep   Objective:  Vitals:   08/30/19 2139 08/31/19 0017 08/31/19 0450 08/31/19 0756  BP:  125/66 (!) 159/84 (!) 153/91  Pulse: 77 68 71 91  Resp: 18 13 20 20   Temp:  (!) 97.4 F (36.3 C) (!) 97.5 F (36.4 C) 98.3 F (36.8 C)  TempSrc:  Axillary Oral Oral  SpO2:  95% 100% 97%  Weight:   113.4 kg   Height:        Intake/Output from previous day:  Intake/Output Summary (Last 24 hours) at 08/31/2019 1004 Last data filed at 08/31/2019 0452 Gross per 24 hour  Intake -  Output 1100 ml  Net -1100 ml    Physical Exam:  Affect appropriate Healthy:  appears stated age HEENT: normal Neck supple with no adenopathy JVP normal no bruits no thyromegaly Lungs clear with no wheezing and good diaphragmatic motion Heart:  S1/S2 no murmur, no rub, gallop or click PMI normal Abdomen: benighn, BS positve, no tenderness, no AAA no bruit.  No HSM or HJR Distal pulses intact with no bruits No edema Neuro non-focal Skin warm and dry No muscular weakness Right radial A no hematoma   Lab Results: Basic Metabolic Panel: Recent Labs    08/28/19 1517 08/29/19 0718  08/30/19 0538 08/30/19 0850  NA 143 138  --  133*  --   K 4.4 5.8*  --  4.6  --   CL 107 101  --  97*  --   CO2 25 24  --  24  --   GLUCOSE 142* 234*   < > 409* 428*  BUN 25* 37*  --  53*  --   CREATININE 1.17* 1.38*  --  1.45*  --   CALCIUM 9.2 9.0  --  9.1  --   MG 2.0 2.0  --   --   --    < > = values in this interval not displayed.   Liver Function Tests: Recent Labs    08/28/19 1517  AST 16  ALT 13  ALKPHOS 52  BILITOT 0.8  PROT 7.5  ALBUMIN 3.8   No results for input(s): LIPASE, AMYLASE in the last 72 hours. CBC: Recent Labs    08/28/19 1517 08/29/19 0718 08/30/19 0538  WBC 6.6 3.6* 6.9  NEUTROABS 3.8 2.7  --   HGB 13.5 13.0 12.4  HCT 40.4 40.0 36.5  MCV 99.5 100.5* 99.5  PLT  163 149* 155   Cardiac Enzymes: Recent Labs    08/29/19 0104  CKTOTAL 77  CKMB 3.2   BNP: Invalid input(s): POCBNP D-Dimer: No results for input(s): DDIMER in the last 72 hours. Hemoglobin A1C: Recent Labs    08/28/19 1517  HGBA1C 6.6*   Fasting Lipid Panel: Recent Labs    08/29/19 0718  CHOL 137  HDL 52  LDLCALC 65  TRIG 101  CHOLHDL 2.6   Thyroid Function Tests: Recent Labs    08/29/19 0718  TSH 0.489    Imaging: No results found.  Cardiac Studies:  ECG: aflutter rate 75 no acute ST changes    Telemetry:  Afib/Flutter rates in 70 bpm range  Echo: EF 60-65% mild to moderate LAE   Medications:   . aspirin EC  81 mg Oral Daily  . atorvastatin  40 mg Oral QHS  . diltiazem  300 mg Oral q morning - 10a  . doxycycline  100 mg Oral Q12H  . gabapentin  600 mg Oral TID  . insulin aspart  0-15 Units Subcutaneous TID WC  . insulin aspart  0-5 Units Subcutaneous QHS  . insulin glargine  45 Units Subcutaneous QHS  . lamoTRIgine  25 mg Oral Daily  . levothyroxine  112 mcg Oral Q0600  . Melatonin  3 mg Oral QHS  . methylPREDNISolone (SOLU-MEDROL) injection  40 mg Intravenous Q6H  . mometasone-formoterol  2 puff Inhalation BID  . sodium chloride flush  3 mL Intravenous Q12H  . sodium chloride flush  3 mL Intravenous Q12H  . sodium chloride flush  3 mL Intravenous Q12H     . sodium chloride      Assessment/Plan:   1. Chest Pain:  Cath yesterday with no CAD non cardiac cath sight A 2. Diastolic CHF:  euvolemic with normal pressures at cath  3. Flutter:  Last [facility] 07/2018 on cardizem resume eliquis should consider AAT with flecainide or ablation f/u [place]  4. DM:  Discussed low carb diet.  Target hemoglobin A1c is 6.5 or less.  Continue current medications.   Jenkins Rouge 08/31/2019, 10:04 AM

## 2019-08-31 NOTE — Progress Notes (Signed)
TR BAND REMOVAL  LOCATION:    right radial  DEFLATED PER PROTOCOL:    Yes.    TIME BAND OFF / DRESSING APPLIED:    20:10   SITE UPON ARRIVAL:    Level 0  SITE AFTER BAND REMOVAL:    Level 0  CIRCULATION SENSATION AND MOVEMENT:    Within Normal Limits   Yes.    COMMENTS:   Post TR band instructions given. Pt tolerated well.

## 2019-08-31 NOTE — Progress Notes (Signed)
CRITICAL VALUE ALERT  Critical Value:  Glucose 577  Date & Time Notied:  11/21 1205  Provider Notified: Cardiology, Bhaget, PA   Orders Received/Actions taken: give 20 units of novolog total.

## 2019-08-31 NOTE — Discharge Summary (Signed)
Discharge Summary    Patient ID: Laura [name] MRN: [id number]; DOB: 1961-04-15  Admit date: 08/28/2019 Discharge date: 08/31/2019  Primary Care Provider: [name], MD  Primary Cardiologist: Kate Sable, MD   Discharge Diagnoses    Principal Problem:   Acute on chronic diastolic CHF (congestive heart failure) [facility]) Active Problems:   Uncontrolled type 2 diabetes mellitus with hyperglycemia ([place])   Hypothyroidism   Essential hypertension, benign   OSA on CPAP   Anxiety   Chest pain   Shortness of breath   Abnormal EKG  Diagnostic Studies/Procedures    LEFT HEART CATH AND CORONARY ANGIOGRAPHY 08/30/2019  Conclusion    LV end diastolic pressure is mildly elevated.  There is no aortic valve stenosis.  Aortic atherosclerosis/calcification noted.  No angiographically apparently CAD.   Continue risk factor modification.  Patient requests to spend the night.  She lives in [place].    Restart Eliquis tomorrow.  Check BMet in AM.  Likely discharge tomorrow.      Echo 08/29/2019  1. Left ventricular ejection fraction, by visual estimation, is 60 to 65%. The left ventricle has normal function. There is mildly increased left ventricular hypertrophy.  2. Elevated left ventricular end-diastolic pressure.  3. Left ventricular diastolic function could not be evaluated.  4. Global right ventricle has mildly reduced systolic function.The right ventricular size is normal. No increase in right ventricular wall thickness.  5. Left atrial size was mild-moderately dilated.  6. Right atrial size was normal.  7. Moderate to severe mitral annular calcification.  8. The mitral valve is degenerative. Trace mitral valve regurgitation.  9. The tricuspid valve is grossly normal. Tricuspid valve regurgitation is trivial. 10. The aortic valve is tricuspid. Aortic valve regurgitation is not visualized. Mild aortic valve sclerosis without stenosis. 11. The pulmonic valve  was grossly normal. Pulmonic valve regurgitation is not visualized. 12. The inferior vena cava is normal in size with greater than 50% respiratory variability, suggesting right atrial pressure of 3 mmHg.   History of Present Illness     Laura [name] is a 58 y.o. female with past medical history of paroxysmal atrial fibrillation (s/p DCCV in early 2000's, recurrent atrial flutter in 07/2018 and successful DCCV in 08/2018), HTN, HLD, IDDM,severe asthma, OSA, and history of ASD repair (echo in 08/2018 showing no significant shunting) transferred to [facility] to [facility] for cath.   She presented to [facility] ED on 08/28/2019 for evaluation of worsening dyspnea on exertion with associated chest discomfort. She had nausea and vomiting on 11/16. Next day she developed worsening shortness of breath and discomfort along her left pectoral region. She was evaluated by PCP and lab work showed "fluid around her heart and heart enzymes were up".  She came to ER due to worsening symptoms. BNP 715. CRP elevated to 5.8. Initial HS Troponin 114 with repeat of 95. CXR with no acute cardiopulmonary abnormalities. EKG on admission showed atrial fibrillation, HR 75 with diffuse TWI along the inferior and lateral leads which was new when compared to prior tracings.   She was admitted to [facility] for IV diuresis, chest pain and presume bronchitis. COVID negative.   Hospital Course     Consultants: IM as attending at [facility]  1. Pleuritic Chest Pain/ Abnormal EKG - Initial HS Troponin 114 with repeat of 95 and 52. CRP elevated to 5.8.EKG on admission showed atrial fibrillation, HR 75 with diffuse TWI along the inferior and lateral leads which was new when compared to prior tracings. Treated  with IV heparin. Cath showed no significant CAD. No recurrent pain. Needs close outpatient follow up.   2. Paroxysmal atrial fibrillation/flutter - Eliquis held initially for cath. Found to be in recurrent atrial fibrillation this admission but was  overall asymptomatic. Plan for cardioversion after uninterrupted anticoagulation for 3 weeks. May consider flecainide or ablation. Resume Eliquis.   3. Acute bronchitis - She was treated with IV solu medrol initially and started on doxycycline by primary at [facility]. Will complete 7 day course.   4. Acute diastolic CHF - BNP was elevated to 715. She did receive IV Lasix 40 mg in the ED and an additional 20mg .  Creatinine up to 1.45. No further diuresis given. Resume lasix at 40mg  qd at discharge.   5. AKI - Creatinine usually runs normal. It was increase during admission to 1.47>>1.17>>1.38>>1.45>> 1.29  at discharge. Get BMET to follow up.   6. DM - Recommended close outpatient follow up with PCP. Blood sugary high ? Due to recent IV steroid.   7. HTN - BP stable. Continue to hold home HCTZ and Lisinopril at discharge>> resume at outpatient. Continue hydralazine.    Did the patient have an acute coronary syndrome (MI, NSTEMI, STEMI, etc) this admission?:  No                               Did the patient have a percutaneous coronary intervention (stent / angioplasty)?:  No.   _____________  Discharge Vitals Blood pressure 109/69, pulse 91, temperature 98.3 F (36.8 C), temperature source Oral, resp. rate 20, height 5\' 4"  (1.626 m), weight 113.4 kg, SpO2 97 %.  Filed Weights   08/30/19 0616 08/30/19 1731 08/31/19 0450  Weight: 115.3 kg 115.5 kg 113.4 kg    Labs & Radiologic Studies    CBC Recent Labs    08/28/19 1517 08/29/19 0718 08/30/19 0538  WBC 6.6 3.6* 6.9  NEUTROABS 3.8 2.7  --   HGB 13.5 13.0 12.4  HCT 40.4 40.0 36.5  MCV 99.5 100.5* 99.5  PLT 163 149* [id number]   Basic Metabolic Panel Recent Labs    08/28/19 1517 08/29/19 0718  08/30/19 0538 08/30/19 0850 08/31/19 1054  NA 143 138  --  133*  --  130*  K 4.4 5.8*  --  4.6  --  4.4  CL 107 101  --  97*  --  97*  CO2 25 24  --  24  --  20*  GLUCOSE 142* 234*   < > 409* 428* 577*  BUN 25* 37*  --  53*  --  59*    CREATININE 1.17* 1.38*  --  1.45*  --  1.29*  CALCIUM 9.2 9.0  --  9.1  --  8.7*  MG 2.0 2.0  --   --   --   --    < > = values in this interval not displayed.   Liver Function Tests Recent Labs    08/28/19 1517  AST 16  ALT 13  ALKPHOS 52  BILITOT 0.8  PROT 7.5  ALBUMIN 3.8   High Sensitivity Troponin:   Recent Labs  Lab 08/28/19 1517 08/29/19 0104 08/29/19 0718  TROPONINIHS 114* 95* 52*    Hemoglobin A1C Recent Labs    08/28/19 1517  HGBA1C 6.6*   Fasting Lipid Panel Recent Labs    08/29/19 0718  CHOL 137  HDL 52  LDLCALC 65  TRIG 101  CHOLHDL  2.6   Thyroid Function Tests Recent Labs    08/29/19 0718  TSH 0.489   _____________  Mr Knee Left Wo Contrast  Result Date: 08/23/2019 CLINICAL DATA:  Posterior left knee pain for 3 weeks. EXAM: MRI OF THE LEFT KNEE WITHOUT CONTRAST TECHNIQUE: Multiplanar, multisequence MR imaging of the knee was performed. No intravenous contrast was administered. COMPARISON:  None. FINDINGS: MENISCI Medial meniscus:  Intact. Lateral meniscus:  Intact. LIGAMENTS Cruciates:  Complete chronic ACL tear.  Intact PCL. Collaterals: Medial collateral ligament is intact. Lateral collateral ligament complex is intact. CARTILAGE Patellofemoral: High-grade partial-thickness cartilage loss with areas of full-thickness cartilage loss of the lateral patellar facet. Partial-thickness cartilage loss throughout the remainder of the patellofemoral compartment. Medial: Mild partial-thickness cartilage loss of the medial femorotibial compartment. Lateral: Partial-thickness cartilage loss of the lateral femorotibial compartment. Joint: Large joint effusion. Normal Hoffa's fat. No plical thickening. Popliteal Fossa:  Tiny Baker's cyst.  Intact popliteus tendon. Extensor Mechanism: Intact quadriceps tendon. Intact patellar tendon. Intact medial patellar retinaculum. Intact lateral patellar retinaculum. Intact MPFL. Bones:  No acute osseous abnormality.  No  aggressive osseous lesion. Other: No fluid collection or hematoma.  Muscles are normal. IMPRESSION: 1. Complete chronic ACL tear. 2. Tricompartmental cartilage abnormalities as described above. Electronically Signed   By: Kathreen Devoid   On: 08/23/2019 08:50   Dg Chest Portable 1 View  Result Date: 08/28/2019 CLINICAL DATA:  58 year old female with shortness of breath. EXAM: PORTABLE CHEST 1 VIEW COMPARISON:  Chest radiograph dated 08/10/2018. FINDINGS: Mild diffuse interstitial prominence similar to prior radiograph. No focal consolidation, pleural effusion, pneumothorax. The cardiac silhouette is within normal limits. Atherosclerotic calcification of the aortic arch. No acute osseous pathology. IMPRESSION: 1. No acute cardiopulmonary process. 2. Stable mild interstitial prominence. Electronically Signed   By: Anner Crete M.D.   On: 08/28/2019 16:13   Disposition   Pt is being discharged home today in good condition.  Follow-up Plans & Appointments    Follow-up Information    Herminio Commons, MD Follow up.   Specialty: Cardiology Why: office will call with time and date. Please call us if you did not heard by Tuesday  Contact information: [place] [address] [phone]          Discharge Instructions    Diet - low sodium heart healthy   Complete by: As directed    Discharge instructions   Complete by: As directed    No driving for 48. No lifting over 5 lbs for 1 week. No sexual activity for 1 week. You may return to work on 09/09/2019. Keep procedure site clean & dry. If you notice increased pain, swelling, bleeding or pus, call/return!  You may shower, but no soaking baths/hot tubs/pools for 1 week.   Hold Metformin until tomorrow. Resume Monday.     Weigh yourself EVERY morning after you go to the bathroom but before you eat or drink anything. Write this number down in a weight log/diary. If you gain 3 pounds overnight or 5 pounds in a week, call the  office. Take your medicines as prescribed. If you have concerns about your medications, please call us before you stop taking them.  Eat low salt foods--Limit salt (sodium) to 2000 mg per day. This will help prevent your body from holding onto fluid. Read food labels as many processed foods have a lot of sodium, especially canned goods and prepackaged meats. If you would like some assistance choosing low sodium foods, we  would be happy to set you up with a nutritionist. Stay as active as you can everyday. Staying active will give you more energy and make your muscles stronger. Start with 5 minutes at a time and work your way up to 30 minutes a day. Break up your activities--do some in the morning and some in the afternoon. Start with 3 days per week and work your way up to 5 days as you can. If you have chest pain, feel short of breath, dizzy, or lightheaded, STOP. If you don't feel better after a short rest, call 911. If you do feel better, call the office to let us know you have symptoms with exercise. Limit all fluids for the day to less than 2 liters. Fluid includes all drinks, coffee, juice, ice chips, soup, jello, and all other liquids.   Increase activity slowly   Complete by: As directed       Discharge Medications   Allergies as of 08/31/2019      Reactions   Ativan [lorazepam] Hives   Phenergan [promethazine Hcl] Hives      Medication List    STOP taking these medications   hydrochlorothiazide 25 MG tablet Commonly known as: HYDRODIURIL   levofloxacin 750 MG tablet Commonly known as: LEVAQUIN   lisinopril 40 MG tablet Commonly known as: ZESTRIL     TAKE these medications   acetaminophen 325 MG tablet Commonly known as: TYLENOL Take 2 tablets (650 mg total) by mouth every 6 (six) hours as needed for mild pain, fever or headache.   atorvastatin 10 MG tablet Commonly known as: LIPITOR Take 10 mg by mouth every evening.   Cuvitru 4 GM/20ML Soln Generic drug: Immune  Globulin (Human) Inject 100 mLs into the skin every 14 (fourteen) days.   diltiazem 300 MG 24 hr capsule Commonly known as: CARDIZEM CD Take 1 capsule (300 mg total) by mouth every morning.   divalproex 500 MG 24 hr tablet Commonly known as: DEPAKOTE ER Take 3 tablets (1,500 mg total) by mouth 2 (two) times daily. 1000 mg in AM, 1500 mg in PM Start taking on: October 10, 2019 What changed:   how much to take  when to take this   doxycycline 100 MG tablet Commonly known as: VIBRA-TABS Take 1 tablet (100 mg total) by mouth every 12 (twelve) hours.   Eliquis 5 MG Tabs tablet Generic drug: apixaban Take 5 mg by mouth 2 (two) times daily.   furosemide 40 MG tablet Commonly known as: LASIX Take 1 tablet (40 mg total) by mouth daily. What changed:   medication strength  how much to take  when to take this  additional instructions   gabapentin 600 MG tablet Commonly known as: NEURONTIN Take 600 mg by mouth 3 (three) times daily.   HumaLOG KwikPen 100 UNIT/ML KwikPen Generic drug: insulin lispro Inject 7 Units into the skin 3 (three) times daily. Sliding scale.   hydrALAZINE 25 MG tablet Commonly known as: APRESOLINE Take 25 mg by mouth 3 (three) times daily.   hydrOXYzine 25 MG tablet Commonly known as: ATARAX/VISTARIL Take 1 tablet (25 mg total) by mouth every 6 (six) hours as needed for anxiety or nausea.   lamoTRIgine 25 MG tablet Commonly known as: LAMICTAL Take 1 tablet (25 mg total) by mouth daily.   Lantus 100 UNIT/ML injection Generic drug: insulin glargine Inject 60 Units into the skin at bedtime.   Lantus SoloStar 100 UNIT/ML Solostar Pen Generic drug: Insulin Glargine SMARTSIG:50 Unit(s) SUB-Q Daily  levothyroxine 112 MCG tablet Commonly known as: SYNTHROID Take 112 mcg by mouth daily before breakfast.   Melatonin 3 MG Tabs Take 3 mg by mouth at bedtime.   metFORMIN 500 MG 24 hr tablet Commonly known as: GLUCOPHAGE-XR Take 1,000 mg by  mouth 2 (two) times daily.   mometasone-formoterol 200-5 MCG/ACT Aero Commonly known as: DULERA Inhale 2 puffs into the lungs 2 (two) times daily.   ondansetron 4 MG tablet Commonly known as: Zofran Take 1 tablet (4 mg total) by mouth every 8 (eight) hours as needed for nausea or vomiting.   ProAir RespiClick [id number] (90 Base) MCG/ACT Aepb Generic drug: Albuterol Sulfate Inhale 1 puff into the lungs every 6 (six) hours as needed (for shortness of breath/wheezing).   Ventolin HFA 108 (90 Base) MCG/ACT inhaler Generic drug: albuterol INHALE 2 PUFFS INTO THE LUNGS EVERY 6 HOURS AS NEEDED FOR WHEEZING OR SHORTNESS OF BREATH   tiotropium 18 MCG inhalation capsule Commonly known as: SPIRIVA Place 18 mcg into inhaler and inhale daily as needed (for breathing).   Trulicity 1.5 0000000 Sopn Generic drug: Dulaglutide Inject 1.5 mg into the skin every Sunday.          Outstanding Labs/Studies   BMET to follow up  Duration of Discharge Encounter   Greater than 30 minutes including physician time.  Jarrett Soho, PA 08/31/2019, 12:25 PM

## 2019-09-02 ENCOUNTER — Encounter (HOSPITAL_COMMUNITY): Payer: Self-pay | Admitting: Interventional Cardiology

## 2019-09-02 DIAGNOSIS — J209 Acute bronchitis, unspecified: Secondary | ICD-10-CM | POA: Diagnosis not present

## 2019-09-02 DIAGNOSIS — I5033 Acute on chronic diastolic (congestive) heart failure: Secondary | ICD-10-CM | POA: Diagnosis not present

## 2019-09-02 DIAGNOSIS — I480 Paroxysmal atrial fibrillation: Secondary | ICD-10-CM | POA: Diagnosis not present

## 2019-09-03 ENCOUNTER — Telehealth: Payer: Self-pay

## 2019-09-03 ENCOUNTER — Other Ambulatory Visit: Payer: Self-pay | Admitting: *Deleted

## 2019-09-03 NOTE — Patient Outreach (Signed)
Red EMMI received for 09/02/19 Day #1 general discharge- Scheduled follow up- no.  Outreach call to pt to address, no answer to telephone and no option to leave voicemail,  RN CM mailed unsuccessful outreach letter to pt home.  PLAN Outreach pt in 3-4 business days  Jacqlyn Larsen [facility], [place] [phone]

## 2019-09-03 NOTE — Telephone Encounter (Signed)
-----   Message from Desma Paganini sent at 09/02/2019 10:23 AM EST ----- SCHEDULED FOR TOC W/ L. INGOLD ON 11/30  ----- Message ----- From: Bernita Raisin, RN Sent: 09/02/2019   7:12 AM EST To: Desma Paganini   ----- Message ----- From: Leanor Kail, PA Sent: 08/31/2019  12:54 PM EST To: Cristi Loron Scheduling  Please schedule this patient for a follow-up appointment.  Primary Cardiologist: Dr. Raliegh Ip Date of Discharge: 08/31/2019 Appointment Needed Within: 7-10 days  Appointment Type: TOC  Thank you! Robbie Lis, PA-C

## 2019-09-03 NOTE — Telephone Encounter (Signed)
Patient contacted regarding discharge from [facility] on 08/28/2019.  Patient understands to follow up with provider Dorene Ar on 09/09/2019 at 1 pm at [place]. Patient understands discharge instructions? yes Patient understands medications and regiment? yes Patient understands to bring all medications to this visit? Yes    Patient also saw pcp yesterday

## 2019-09-04 ENCOUNTER — Other Ambulatory Visit (HOSPITAL_COMMUNITY)
Admission: RE | Admit: 2019-09-04 | Discharge: 2019-09-04 | Disposition: A | Discharge status: Home / Self Care | Payer: Medicare Other | Source: Ambulatory Visit | Attending: Cardiovascular Disease | Admitting: Cardiovascular Disease

## 2019-09-04 ENCOUNTER — Ambulatory Visit (INDEPENDENT_AMBULATORY_CARE_PROVIDER_SITE_OTHER): Payer: Medicare Other | Admitting: Cardiovascular Disease

## 2019-09-04 ENCOUNTER — Telehealth: Payer: Self-pay | Admitting: Allergy & Immunology

## 2019-09-04 ENCOUNTER — Telehealth: Payer: Self-pay | Admitting: *Deleted

## 2019-09-04 ENCOUNTER — Encounter: Payer: Self-pay | Admitting: Cardiovascular Disease

## 2019-09-04 ENCOUNTER — Other Ambulatory Visit: Payer: Self-pay

## 2019-09-04 VITALS — BP 148/84 | HR 99 | Temp 96.2°F | Ht 64.0 in | Wt 243.0 lb

## 2019-09-04 DIAGNOSIS — I480 Paroxysmal atrial fibrillation: Secondary | ICD-10-CM

## 2019-09-04 DIAGNOSIS — I10 Essential (primary) hypertension: Secondary | ICD-10-CM | POA: Insufficient documentation

## 2019-09-04 DIAGNOSIS — Z8774 Personal history of (corrected) congenital malformations of heart and circulatory system: Secondary | ICD-10-CM

## 2019-09-04 DIAGNOSIS — R5383 Other fatigue: Secondary | ICD-10-CM

## 2019-09-04 DIAGNOSIS — J209 Acute bronchitis, unspecified: Secondary | ICD-10-CM | POA: Diagnosis not present

## 2019-09-04 DIAGNOSIS — R0602 Shortness of breath: Secondary | ICD-10-CM | POA: Diagnosis not present

## 2019-09-04 DIAGNOSIS — I5033 Acute on chronic diastolic (congestive) heart failure: Secondary | ICD-10-CM | POA: Diagnosis not present

## 2019-09-04 LAB — CBC WITH DIFFERENTIAL/PLATELET
Abs Immature Granulocytes: 0.16 10*3/uL — ABNORMAL HIGH (ref 0.00–0.07)
Basophils Absolute: 0.1 10*3/uL (ref 0.0–0.1)
Basophils Relative: 1 %
Eosinophils Absolute: 0.2 10*3/uL (ref 0.0–0.5)
Eosinophils Relative: 2 %
HCT: 46.1 % — ABNORMAL HIGH (ref 36.0–46.0)
Hemoglobin: 15.9 g/dL — ABNORMAL HIGH (ref 12.0–15.0)
Immature Granulocytes: 2 %
Lymphocytes Relative: 34 %
Lymphs Abs: 3.5 10*3/uL (ref 0.7–4.0)
MCH: 33 pg (ref 26.0–34.0)
MCHC: 34.5 g/dL (ref 30.0–36.0)
MCV: 95.6 fL (ref 80.0–100.0)
Monocytes Absolute: 0.6 10*3/uL (ref 0.1–1.0)
Monocytes Relative: 6 %
Neutro Abs: 5.7 10*3/uL (ref 1.7–7.7)
Neutrophils Relative %: 55 %
Platelets: 228 10*3/uL (ref 150–400)
RBC: 4.82 MIL/uL (ref 3.87–5.11)
RDW: 12.7 % (ref 11.5–15.5)
WBC: 10.3 10*3/uL (ref 4.0–10.5)
nRBC: 0 % (ref 0.0–0.2)

## 2019-09-04 LAB — COMPREHENSIVE METABOLIC PANEL
ALT: 21 U/L (ref 0–44)
AST: 29 U/L (ref 15–41)
Albumin: 3.8 g/dL (ref 3.5–5.0)
Alkaline Phosphatase: 59 U/L (ref 38–126)
Anion gap: 15 (ref 5–15)
BUN: 28 mg/dL — ABNORMAL HIGH (ref 6–20)
CO2: 25 mmol/L (ref 22–32)
Calcium: 9.1 mg/dL (ref 8.9–10.3)
Chloride: 96 mmol/L — ABNORMAL LOW (ref 98–111)
Creatinine, Ser: 1.15 mg/dL — ABNORMAL HIGH (ref 0.44–1.00)
GFR calc Af Amer: >60 mL/min (ref >60)
GFR calc non Af Amer: 53 mL/min — ABNORMAL LOW (ref >60)
Glucose, Bld: 324 mg/dL — ABNORMAL HIGH (ref 70–99)
Potassium: 4.1 mmol/L (ref 3.5–5.1)
Sodium: 136 mmol/L (ref 135–145)
Total Bilirubin: 0.8 mg/dL (ref 0.3–1.2)
Total Protein: 7.1 g/dL (ref 6.5–8.1)

## 2019-09-04 LAB — MAGNESIUM: Magnesium: 1.6 mg/dL — ABNORMAL LOW (ref 1.7–2.4)

## 2019-09-04 NOTE — Telephone Encounter (Signed)
Denise from [facility] called to see if we received a fax for resume orders that was faxed to [place] office. Did not find fax.

## 2019-09-04 NOTE — Telephone Encounter (Signed)
Is she taking Lasix 40 mg daily as prescribed at discharge? She has an appt with me this afternoon and I can evaluate.

## 2019-09-04 NOTE — Telephone Encounter (Signed)
Pt states she was taking a shower and now c/o increased SOB this am and nausea that started last night. Denies chest pain at this time. Has lost 4 lbs over night. Please advise.

## 2019-09-04 NOTE — Progress Notes (Signed)
SUBJECTIVE: The patient presents for a transition of care visit.  I last saw her while she was hospitalized at [facility] prior to me transferring her to [facility] for coronary angiography.  This was performed on 08/30/2019 which showed no angiographically apparent coronary artery disease.  LVEDP was mildly elevated.  She appeared to have a viral upper respiratory infection.  She was discharged on Lasix 40 mg daily.  Echocardiogram on 08/29/2019 demonstrated normal LV systolic function, EF 60 to 65%.  She had been experiencing pleuritic chest pain and was treated for acute bronchitis with IV Solu-Medrol and then doxycycline.  She called our office this morning complaining of nausea and shortness of breath.  She saw her PCP 2 days ago who prescribed torsemide 60 mg daily and stop Lasix.  She took torsemide yesterday and today.  She has lost 4 pounds.  She said she feels no better.  She feels tired and dyspneic with exertion.  She continues to have pleuritic chest pain.  She is finishing up the course of doxycycline.  Labs on 08/31/2019: BUN 59, creatinine 1.29, previously 1.45.   Review of Systems: As per "subjective", otherwise negative.  Allergies  Allergen Reactions  . Ativan [Lorazepam] Hives  . Phenergan [Promethazine Hcl] Hives    Current Outpatient Medications  Medication Sig Dispense Refill  . acetaminophen (TYLENOL) 325 MG tablet Take 2 tablets (650 mg total) by mouth every 6 (six) hours as needed for mild pain, fever or headache. 30 tablet 1  . Albuterol Sulfate (PROAIR RESPICLICK) 123XX123 (90 Base) MCG/ACT AEPB Inhale 1 puff into the lungs every 6 (six) hours as needed (for shortness of breath/wheezing).     Marland Kitchen apixaban (ELIQUIS) 5 MG TABS tablet Take 5 mg by mouth 2 (two) times daily.    Marland Kitchen atorvastatin (LIPITOR) 10 MG tablet Take 10 mg by mouth every evening.     . diltiazem (CARDIZEM CD) 300 MG 24 hr capsule Take 1 capsule (300 mg total) by mouth every morning. 90  capsule 3  . [START ON 10/10/2019] divalproex (DEPAKOTE ER) 500 MG 24 hr tablet Take 3 tablets (1,500 mg total) by mouth 2 (two) times daily. 1000 mg in AM, 1500 mg in PM (Patient taking differently: Take 1,000-1,500 mg by mouth See admin instructions. 1000 mg in AM, 1500 mg in PM) 450 tablet 0  . doxycycline (VIBRA-TABS) 100 MG tablet Take 1 tablet (100 mg total) by mouth every 12 (twelve) hours. 10 tablet 0  . Dulaglutide (TRULICITY) 1.5 0000000 SOPN Inject 1.5 mg into the skin every Sunday.     . gabapentin (NEURONTIN) 600 MG tablet Take 600 mg by mouth 3 (three) times daily.    Marland Kitchen HUMALOG KWIKPEN 100 UNIT/ML KwikPen Inject 7 Units into the skin 3 (three) times daily. Sliding scale.    . hydrALAZINE (APRESOLINE) 25 MG tablet Take 25 mg by mouth 3 (three) times daily.    . hydrOXYzine (ATARAX/VISTARIL) 25 MG tablet Take 1 tablet (25 mg total) by mouth every 6 (six) hours as needed for anxiety or nausea. 30 tablet 0  . Immune Globulin, Human, (CUVITRU) 4 GM/20ML SOLN Inject 100 mLs into the skin every 14 (fourteen) days.    . insulin glargine (LANTUS) 100 UNIT/ML injection Inject 60 Units into the skin at bedtime.    . lamoTRIgine (LAMICTAL) 25 MG tablet Take 1 tablet (25 mg total) by mouth daily. 90 tablet 0  . LANTUS SOLOSTAR 100 UNIT/ML Solostar Pen SMARTSIG:50 Unit(s) SUB-Q  Daily    . levothyroxine (SYNTHROID, LEVOTHROID) 112 MCG tablet Take 112 mcg by mouth daily before breakfast.    . Melatonin 3 MG TABS Take 3 mg by mouth at bedtime.    . metFORMIN (GLUCOPHAGE-XR) 500 MG 24 hr tablet Take 1,000 mg by mouth 2 (two) times daily.   0  . mometasone-formoterol (DULERA) 200-5 MCG/ACT AERO Inhale 2 puffs into the lungs 2 (two) times daily. 13 g 5  . ondansetron (ZOFRAN) 4 MG tablet Take 1 tablet (4 mg total) by mouth every 8 (eight) hours as needed for nausea or vomiting. 20 tablet 0  . tiotropium (SPIRIVA) 18 MCG inhalation capsule Place 18 mcg into inhaler and inhale daily as needed (for  breathing).     . torsemide (DEMADEX) 20 MG tablet Take 60 mg by mouth daily.    . VENTOLIN HFA 108 (90 Base) MCG/ACT inhaler INHALE 2 PUFFS INTO THE LUNGS EVERY 6 HOURS AS NEEDED FOR WHEEZING OR SHORTNESS OF BREATH 18 g 1   No current facility-administered medications for this visit.     Past Medical History:  Diagnosis Date  . Asthma   . Atrial fibrillation ([place])   . Bipolar disorder ([place])   . Bronchiectasis ([place])   . CHF (congestive heart failure) ([place])   . Complication of anesthesia    "my Dr in [place] told me I had an allergic reaction to the combination of the pain meds and the anesthesia" she does not remember what happened but "I eneded up in the ICU for 5 days".  . H/O congenital atrial septal defect (ASD) repair   . Hypogammaglobulinemia ([place])   . Hypothyroid   . 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 Neuropathy   . Osteoarthritis   . PTSD (post-traumatic stress disorder)   . Recurrent sinusitis   . Short-term memory loss   . Sleep apnea   . TIA (transient ischemic attack)   . Tremors of nervous system    seeing PCP for this.  . Type 2 diabetes mellitus ([place])   . Urticaria   . Wound infection     Past Surgical History:  Procedure Laterality Date  . 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 CARDIAC SURGERY    . CARDIOVERSION    . CARDIOVERSION N/A 08/29/2018   Procedure: CARDIOVERSION;  Surgeon: Arnoldo Lenis, MD;  Location: [facility];  Service: Endoscopy;  Laterality: N/A;  . [place]  . LEFT HEART CATH AND CORONARY ANGIOGRAPHY N/A 08/30/2019   Procedure: LEFT HEART CATH AND CORONARY ANGIOGRAPHY;  Surgeon: [name], MD;  Location: [facility];  Service: Cardiovascular;  Laterality: N/A;  . NASAL SINUS SURGERY  2017    Social History   Socioeconomic History  . Marital status: Widowed    Spouse name: Not on file  . Number of children: Not on file  . Years of education: Not on file  . Highest education level: Not on file  Occupational  History  . Not on file  Social Needs  . Financial resource strain: Patient refused  . Food insecurity    Worry: Patient refused    Inability: Patient refused  . Transportation needs    Medical: Patient refused    Non-medical: Patient refused  Tobacco Use  . Smoking status: Former Smoker    Packs/day: 1.00    Years: 17.00    Pack years: 17.00    Types: Cigarettes    Quit date: 2008  Years since quitting: 12.9  . Smokeless tobacco: Never Used  Substance and Sexual Activity  . Alcohol use: Not Currently  . Drug use: Not Currently  . Sexual activity: Yes    Birth control/protection: None  Lifestyle  . Physical activity    Days per week: Patient refused    Minutes per session: Patient refused  . Stress: Patient refused  Relationships  . Social Herbalist on phone: Patient refused    Gets together: Patient refused    Attends religious service: Patient refused    Active member of club or organization: Patient refused    Attends meetings of clubs or organizations: Patient refused    Relationship status: Patient refused  . Intimate partner violence    Fear of current or ex partner: Patient refused    Emotionally abused: Patient refused    Physically abused: Patient refused    Forced sexual activity: Patient refused  Other Topics Concern  . Not on file  Social History Narrative  . Not on file     Vitals:   09/04/19 1308  BP: (!) 148/84  Pulse: 99  Temp: (!) 96.2 F (35.7 C)  SpO2: 98%  Height: 5\' 4"  (1.626 m)    Wt Readings from Last 3 Encounters:  08/31/19 250 lb 1.6 oz (113.4 kg)  07/14/19 251 lb 5.2 oz (114 kg)  06/28/19 253 lb (114.8 kg)     PHYSICAL EXAM General: Ill-appearing, no acute distress HEENT: Normal. Neck: No JVD, no thyromegaly. Lungs: Clear to auscultation bilaterally with normal respiratory effort. CV: Regular rate and rhythm, normal S1/S2, no S3/S4, no murmur. No pretibial or periankle edema.   Abdomen: Soft, nontender, no  distention.  Neurologic: Alert and oriented.  Psych: Normal affect. Skin: Normal. Musculoskeletal: No gross deformities.      Labs: Lab Results  Component Value Date/Time   K 4.4 08/31/2019 10:54 AM   BUN 59 (H) 08/31/2019 10:54 AM   BUN 30 (H) 03/15/2019 02:07 PM   CREATININE 1.29 (H) 08/31/2019 10:54 AM   CREATININE 0.51 03/13/2018 10:34 AM   ALT 13 08/28/2019 03:17 PM   TSH 0.489 08/29/2019 07:18 AM   TSH 1.00 03/13/2018 10:34 AM   HGB 12.4 08/30/2019 05:38 AM   HGB 13.8 03/15/2019 02:07 PM     Lipids: Lab Results  Component Value Date/Time   LDLCALC 65 08/29/2019 07:18 AM   CHOL 137 08/29/2019 07:18 AM   TRIG 101 08/29/2019 07:18 AM   HDL 52 08/29/2019 07:18 AM       ASSESSMENT AND PLAN: 1.  Chronic diastolic heart failure: Currently on torsemide 60 mg daily.  I will obtain a comprehensive metabolic panel, magnesium and CBC.  2.  Paroxysmal atrial fibrillation: Currently in a regular rhythm.  Continue Cardizem CD 300 mg daily.  Continue Eliquis for anticoagulation.  3.  History of ASD repair: No significant shunting by most recent echocardiogram in November 2020.  4.  Acute viral bronchitis: Continue doxycycline.  She is still suffering from viral symptoms including diffuse myalgias and shortness of breath as well as fatigue.  5.  Hypertension: BP is mildly elevated.  Currently on hydralazine and long-acting Cardizem.  No changes for now.  6.  Shortness of breath/fatigue: See discussion in #4.  I will check a comprehensive metabolic panel, magnesium and CBC.    Disposition: Follow up 3 months   Kate Sable, M.D., F.A.C.C.

## 2019-09-04 NOTE — Addendum Note (Signed)
Addended by: Debbora Lacrosse R on: 09/04/2019 01:52 PM   Modules accepted: Orders

## 2019-09-04 NOTE — Telephone Encounter (Signed)
Pt notified and she reports that PCP changed lasix to torsemide.

## 2019-09-04 NOTE — Patient Instructions (Signed)
Medication Instructions:  Your physician recommends that you continue on your current medications as directed. Please refer to the Current Medication list given to you today.   Labwork:  TODAY   Testing/Procedures: NONE  Follow-Up: Your physician recommends that you schedule a follow-up appointment in: 3 MONTHS    Any Other Special Instructions Will Be Listed Below (If Applicable).     If you need a refill on your cardiac medications before your next appointment, please call your pharmacy.

## 2019-09-09 ENCOUNTER — Other Ambulatory Visit: Payer: Self-pay | Admitting: *Deleted

## 2019-09-09 ENCOUNTER — Ambulatory Visit: Payer: Medicare Other | Admitting: Cardiology

## 2019-09-09 NOTE — Patient Outreach (Signed)
Red EMMI flag for general discharge received for Day #4 09/05/19- Sad, hopeless, empty, anxious- yes.  Outreach call to pt to address and pt states " I have had some depression for awhile"  Pt states she is on medication and has been seeing psychiatrist and has appointment for therapy session this week. Pt states her spouse passed away 6 years ago and his birthday was in November and she is always sad this month every year and states it is nothing unusual.  RN CM also addressed scheduled follow up- no.  Pt reports she has already seen primary MD on 11/23 and cardiologist Dr. Jetty Duhamel on 09/03/29.  No other concerns voiced.  Jacqlyn Larsen [facility], Industry Coordinator [phone]

## 2019-09-10 ENCOUNTER — Ambulatory Visit: Payer: Self-pay | Admitting: *Deleted

## 2019-09-10 NOTE — Telephone Encounter (Signed)
Fax from [facility] has been signed and  faxed to [phone].  Forms have been sent to [facility] for scanning.

## 2019-09-10 NOTE — Telephone Encounter (Signed)
Left message for Langley Gauss to call back to see what orders were needed and if they could fax back

## 2019-09-18 ENCOUNTER — Emergency Department (HOSPITAL_COMMUNITY): Payer: Medicare Other

## 2019-09-18 ENCOUNTER — Ambulatory Visit (HOSPITAL_COMMUNITY)
Admission: RE | Admit: 2019-09-18 | Discharge: 2019-09-18 | Disposition: A | Discharge status: Home / Self Care | Payer: Medicare Other | Source: Ambulatory Visit | Attending: Cardiovascular Disease | Admitting: Cardiovascular Disease

## 2019-09-18 ENCOUNTER — Emergency Department (HOSPITAL_COMMUNITY)
Admission: EM | Admit: 2019-09-18 | Discharge: 2019-09-18 | Disposition: A | Discharge status: Home / Self Care | Payer: Medicare Other | Attending: Emergency Medicine | Admitting: Emergency Medicine

## 2019-09-18 ENCOUNTER — Other Ambulatory Visit (HOSPITAL_COMMUNITY)
Admission: RE | Admit: 2019-09-18 | Discharge: 2019-09-18 | Disposition: A | Discharge status: Home / Self Care | Payer: Medicare Other | Source: Ambulatory Visit | Attending: Cardiovascular Disease | Admitting: Cardiovascular Disease

## 2019-09-18 ENCOUNTER — Telehealth: Payer: Self-pay | Admitting: Cardiovascular Disease

## 2019-09-18 ENCOUNTER — Other Ambulatory Visit: Payer: Self-pay

## 2019-09-18 ENCOUNTER — Encounter (HOSPITAL_COMMUNITY): Payer: Self-pay | Admitting: *Deleted

## 2019-09-18 DIAGNOSIS — R0602 Shortness of breath: Secondary | ICD-10-CM | POA: Insufficient documentation

## 2019-09-18 DIAGNOSIS — Z79899 Other long term (current) drug therapy: Secondary | ICD-10-CM | POA: Diagnosis not present

## 2019-09-18 DIAGNOSIS — E039 Hypothyroidism, unspecified: Secondary | ICD-10-CM | POA: Diagnosis not present

## 2019-09-18 DIAGNOSIS — E119 Type 2 diabetes mellitus without complications: Secondary | ICD-10-CM | POA: Diagnosis not present

## 2019-09-18 DIAGNOSIS — I10 Essential (primary) hypertension: Secondary | ICD-10-CM

## 2019-09-18 DIAGNOSIS — R05 Cough: Secondary | ICD-10-CM | POA: Diagnosis not present

## 2019-09-18 DIAGNOSIS — Z7901 Long term (current) use of anticoagulants: Secondary | ICD-10-CM | POA: Insufficient documentation

## 2019-09-18 DIAGNOSIS — Z87891 Personal history of nicotine dependence: Secondary | ICD-10-CM | POA: Diagnosis not present

## 2019-09-18 DIAGNOSIS — I5032 Chronic diastolic (congestive) heart failure: Secondary | ICD-10-CM | POA: Diagnosis not present

## 2019-09-18 DIAGNOSIS — J90 Pleural effusion, not elsewhere classified: Secondary | ICD-10-CM | POA: Insufficient documentation

## 2019-09-18 DIAGNOSIS — I110 Hypertensive heart disease with heart failure: Secondary | ICD-10-CM | POA: Insufficient documentation

## 2019-09-18 DIAGNOSIS — Z794 Long term (current) use of insulin: Secondary | ICD-10-CM | POA: Insufficient documentation

## 2019-09-18 LAB — BASIC METABOLIC PANEL
Anion gap: 13 (ref 5–15)
BUN: 23 mg/dL — ABNORMAL HIGH (ref 6–20)
CO2: 30 mmol/L (ref 22–32)
Calcium: 8.8 mg/dL — ABNORMAL LOW (ref 8.9–10.3)
Chloride: 97 mmol/L — ABNORMAL LOW (ref 98–111)
Creatinine, Ser: 0.96 mg/dL (ref 0.44–1.00)
GFR calc Af Amer: >60 mL/min (ref >60)
GFR calc non Af Amer: >60 mL/min (ref >60)
Glucose, Bld: 147 mg/dL — ABNORMAL HIGH (ref 70–99)
Potassium: 3.9 mmol/L (ref 3.5–5.1)
Sodium: 140 mmol/L (ref 135–145)

## 2019-09-18 LAB — COMPREHENSIVE METABOLIC PANEL
ALT: 16 U/L (ref 0–44)
AST: 15 U/L (ref 15–41)
Albumin: 4 g/dL (ref 3.5–5.0)
Alkaline Phosphatase: 56 U/L (ref 38–126)
Anion gap: 15 (ref 5–15)
BUN: 24 mg/dL — ABNORMAL HIGH (ref 6–20)
CO2: 30 mmol/L (ref 22–32)
Calcium: 9.2 mg/dL (ref 8.9–10.3)
Chloride: 98 mmol/L (ref 98–111)
Creatinine, Ser: 0.99 mg/dL (ref 0.44–1.00)
GFR calc Af Amer: >60 mL/min (ref >60)
GFR calc non Af Amer: >60 mL/min (ref >60)
Glucose, Bld: 113 mg/dL — ABNORMAL HIGH (ref 70–99)
Potassium: 4 mmol/L (ref 3.5–5.1)
Sodium: 143 mmol/L (ref 135–145)
Total Bilirubin: 0.4 mg/dL (ref 0.3–1.2)
Total Protein: 7.2 g/dL (ref 6.5–8.1)

## 2019-09-18 LAB — CBC WITH DIFFERENTIAL/PLATELET
Abs Immature Granulocytes: 0.05 10*3/uL (ref 0.00–0.07)
Basophils Absolute: 0 10*3/uL (ref 0.0–0.1)
Basophils Relative: 0 %
Eosinophils Absolute: 0 10*3/uL (ref 0.0–0.5)
Eosinophils Relative: 0 %
HCT: 37 % (ref 36.0–46.0)
Hemoglobin: 12.3 g/dL (ref 12.0–15.0)
Immature Granulocytes: 1 %
Lymphocytes Relative: 36 %
Lymphs Abs: 1.7 10*3/uL (ref 0.7–4.0)
MCH: 33.2 pg (ref 26.0–34.0)
MCHC: 33.2 g/dL (ref 30.0–36.0)
MCV: 100 fL (ref 80.0–100.0)
Monocytes Absolute: 0.4 10*3/uL (ref 0.1–1.0)
Monocytes Relative: 9 %
Neutro Abs: 2.6 10*3/uL (ref 1.7–7.7)
Neutrophils Relative %: 54 %
Platelets: 202 10*3/uL (ref 150–400)
RBC: 3.7 MIL/uL — ABNORMAL LOW (ref 3.87–5.11)
RDW: 13.1 % (ref 11.5–15.5)
WBC: 4.8 10*3/uL (ref 4.0–10.5)
nRBC: 0 % (ref 0.0–0.2)

## 2019-09-18 LAB — BRAIN NATRIURETIC PEPTIDE
B Natriuretic Peptide: 204 pg/mL — ABNORMAL HIGH (ref 0.0–100.0)
B Natriuretic Peptide: 208 pg/mL — ABNORMAL HIGH (ref 0.0–100.0)

## 2019-09-18 IMAGING — DX DG CHEST 1V PORT
1 series · 1 of 1 positions shown · non-contrast
Comparison: Films from earlier in the same day.

CLINICAL DATA: Shortness of breath

EXAM:
PORTABLE CHEST 1 VIEW

[chest ap]
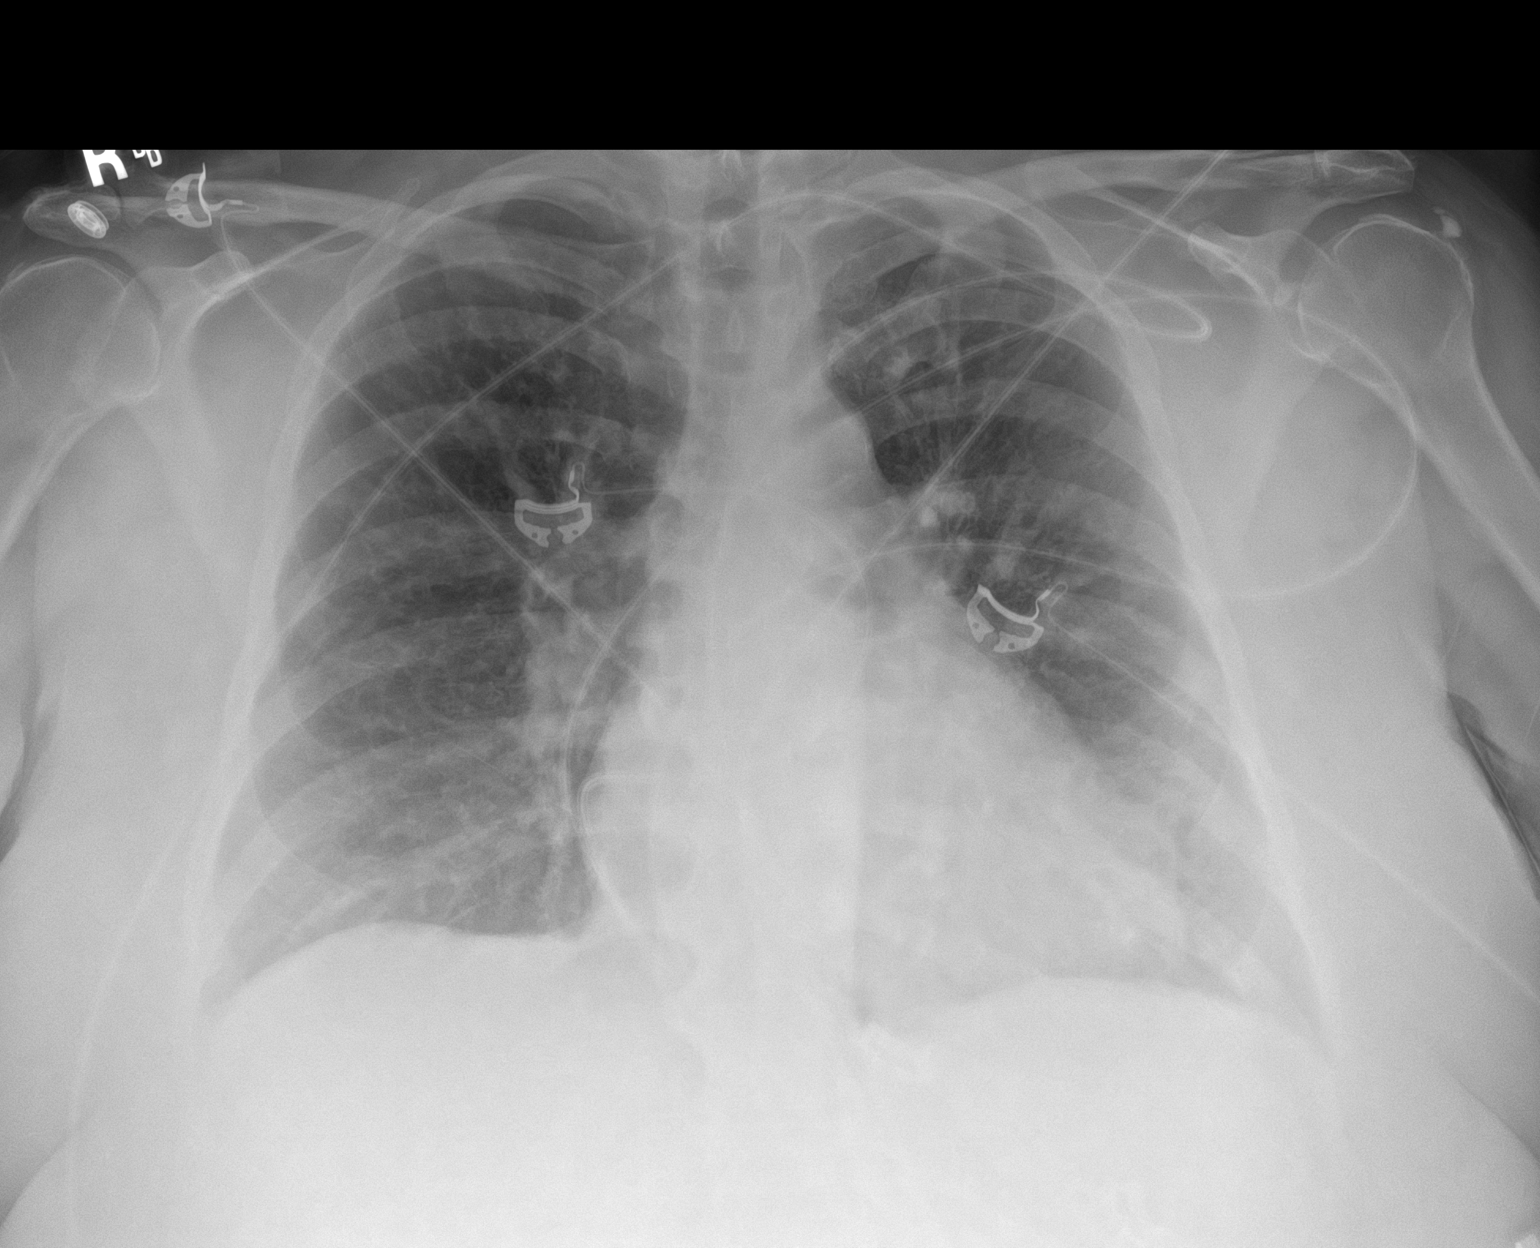

[1 of 1 positions shown; findings below may reference images not displayed]

FINDINGS: Cardiac shadow is at the upper limits of normal in size. Aortic
calcifications are noted. Lungs are well aerated bilaterally. Some
improved aeration is noted in the right lung base. Patchy opacities
are noted peripherally in the left lung base. No bony abnormality is
noted.
IMPRESSION: Patchy opacities in the left lung base. Atypical pneumonia could
present in this fashion although this may simply represent
atelectatic change given the slight increase in a short period of
time from the prior exam.

## 2019-09-18 IMAGING — CT CT ANGIO CHEST
2 of 6 series · 18 of 46 positions shown · IV contrast (Omnipaque or Isovue)
Comparison: High-resolution CT chest [DATE]

CLINICAL DATA: Has put on 5 pounds this week, LEFT foot swelling
since [REDACTED], shortness of breath, orthopnea, sleeping sitting up,
cough, question pulmonary embolism

EXAM:
CT ANGIOGRAPHY CHEST WITH CONTRAST
TECHNIQUE: Multidetector CT imaging of the chest was performed using the
standard protocol during bolus administration of intravenous
contrast. Multiplanar CT image reconstructions and MIPs were
obtained to evaluate the vascular anatomy.
CONTRAST:  100mL OMNIPAQUE IOHEXOL 350 MG/ML SOLN IV

[Series 5: pe axial thins · axial · 0.72mm/px · z∈[-379,-70]mm · 15 of 339 slices shown]
[im 15/339  lung]
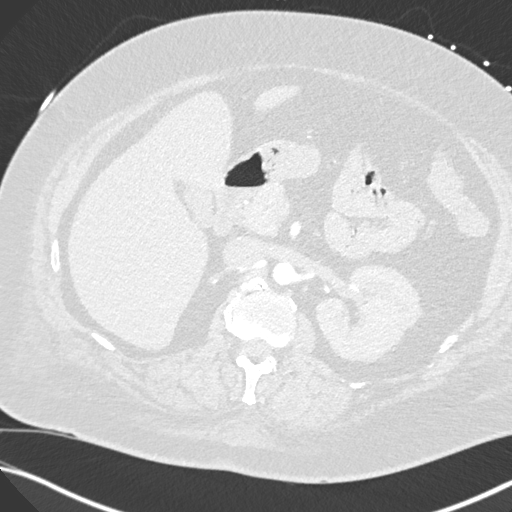
[im 45/339  soft-tissue]
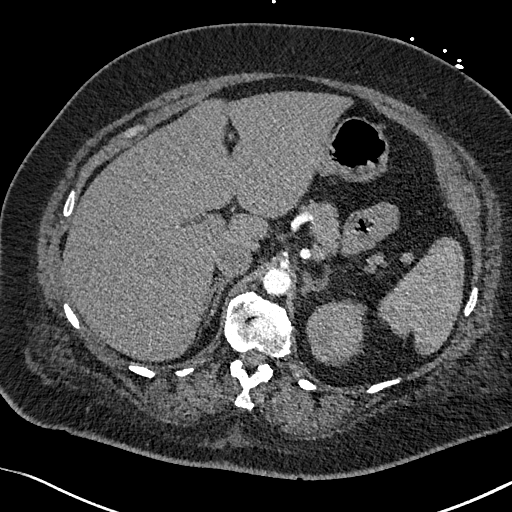
[im 59/339  lung]
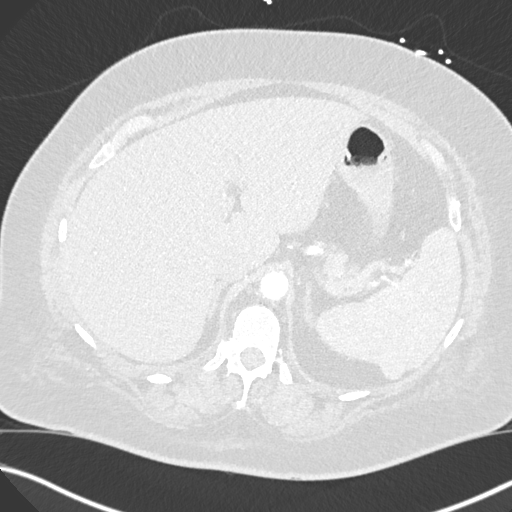
[im 89/339  soft-tissue]
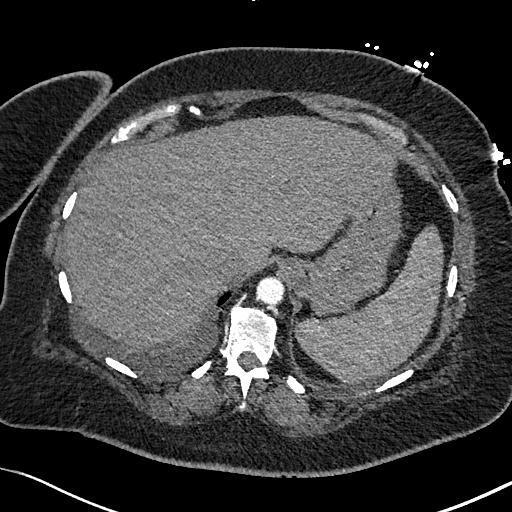
[im 103/339  lung]
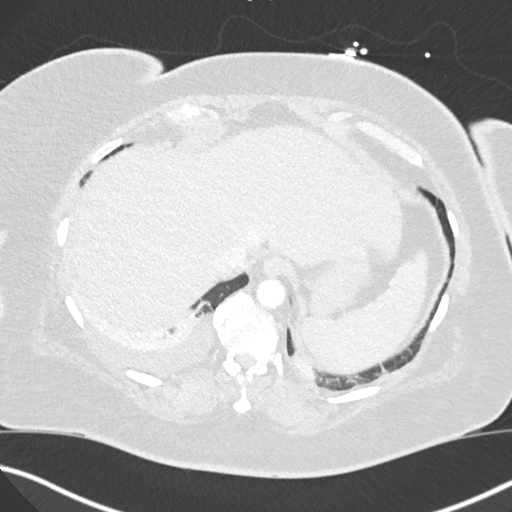
[im 133/339  soft-tissue]
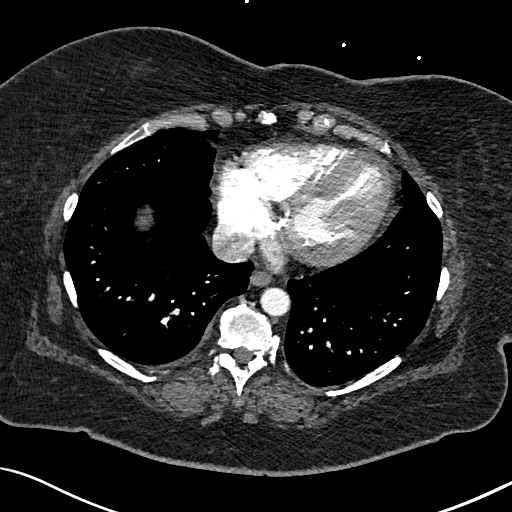
[im 147/339  lung]
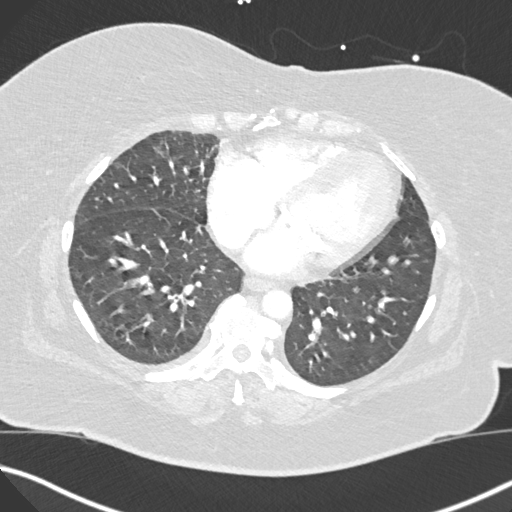
[im 177/339  soft-tissue]
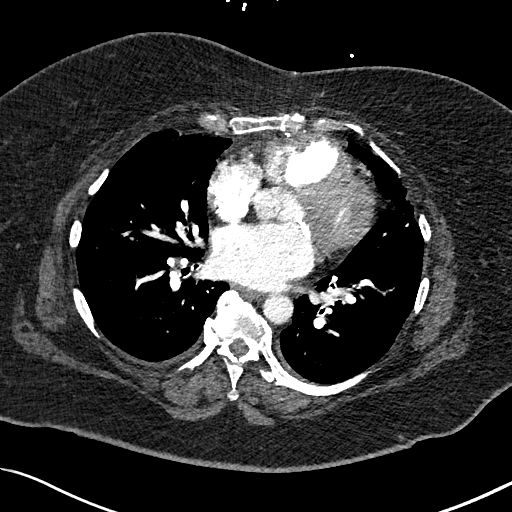
[im 192/339  lung]
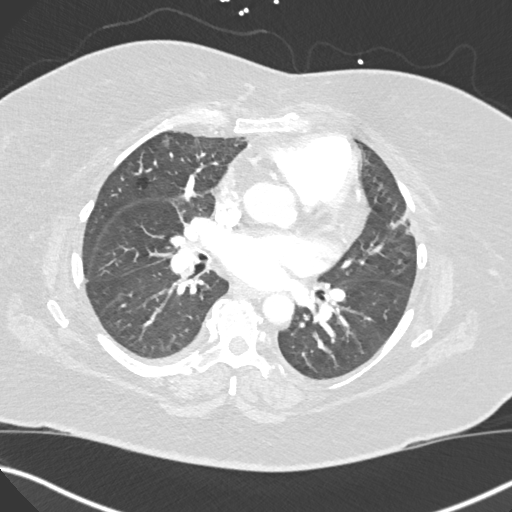
[im 206/339  soft-tissue]
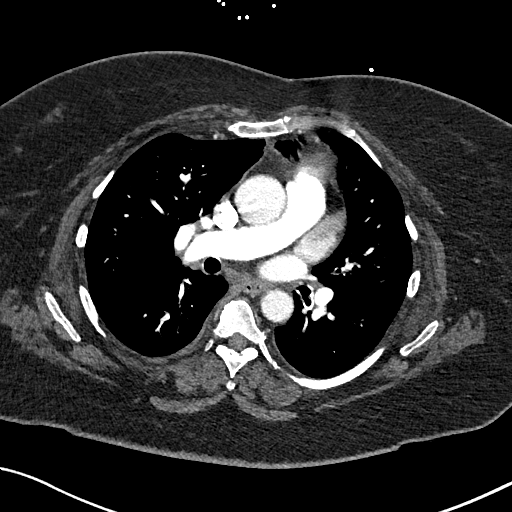
[im 236/339  lung]
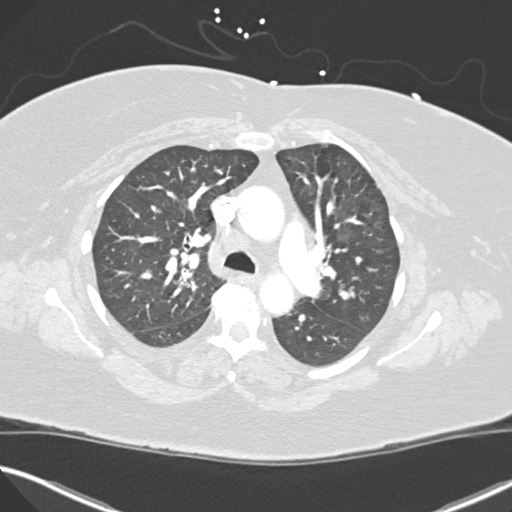
[im 250/339  soft-tissue]
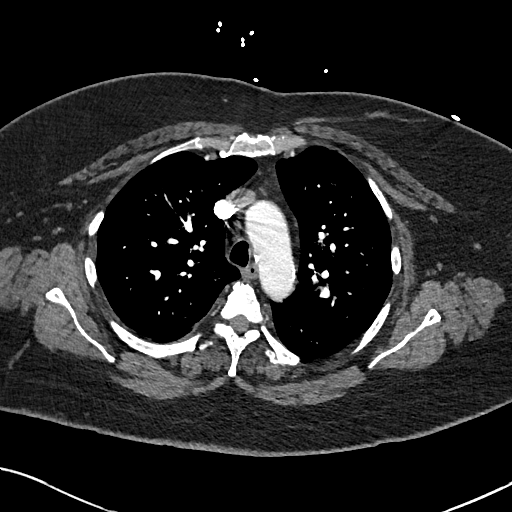
[im 280/339  lung]
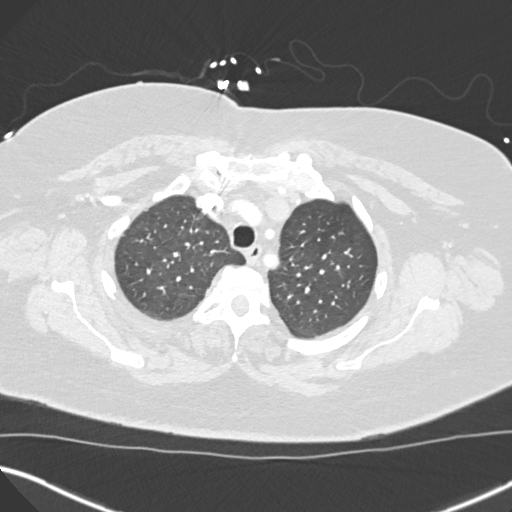
[im 294/339  soft-tissue]
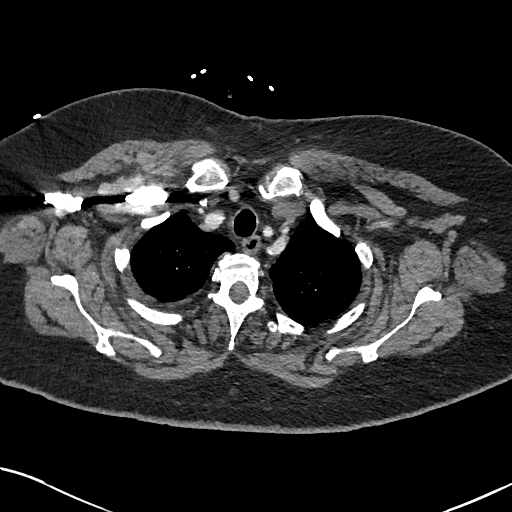
[im 324/339  lung]
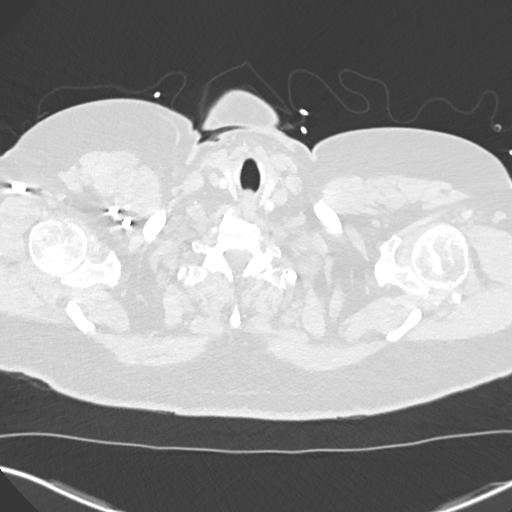

[Series 8: cor soft · coronal · 0.69mm/px · 3 of 144 slices shown]
[im 36/144  soft-tissue]
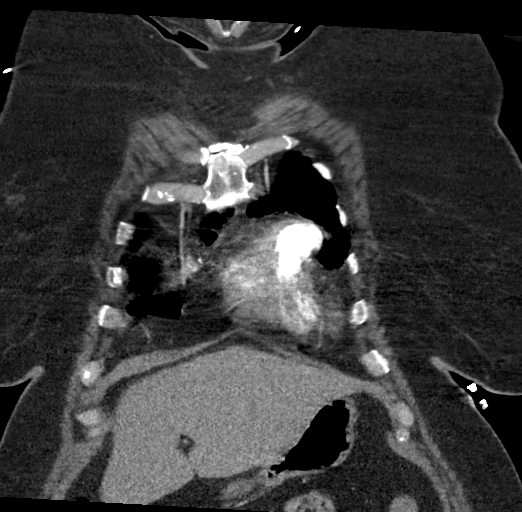
[im 72/144  soft-tissue]
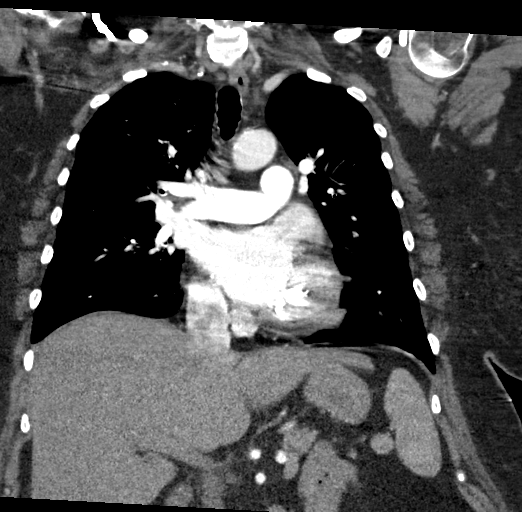
[im 108/144  soft-tissue]
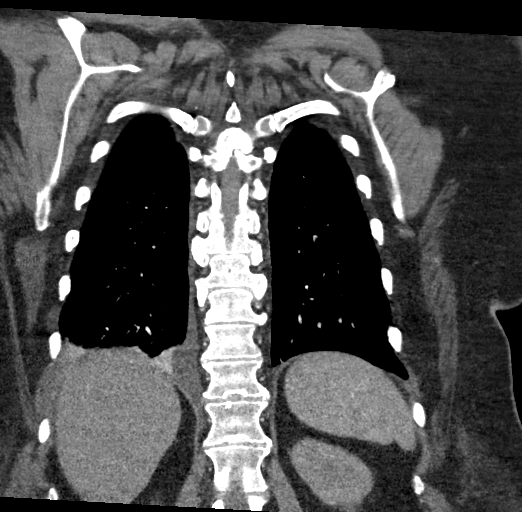

[18 of 46 positions shown; findings below may reference images not displayed]

FINDINGS: Cardiovascular: Atherosclerotic calcifications aorta, proximal great
vessels, minimally in coronary arteries. Ascending thoracic aorta
borderline dilated 3.9 cm diameter. No aortic dissection. Heart
enlarged. No pericardial effusion. Pulmonary arteries well
opacified. A questionable filling defect on axial images in the
RIGHT pulmonary artery series 5 image 119 appears to represent a
volume averaging artifact with a small lymph nodeand is not
confirmed on sagittal and coronal views. No definite pulmonary
emboli identified.

Mediastinum/Nodes: Esophagus unremarkable. Minimally enlarged
precarinal lymph node 12 mm short axis image 36. No thoracic
adenopathy otherwise seen. Base of cervical region normal
appearance.

Lungs/Pleura: Peribronchial thickening. Minimal RIGHT pleural
effusion. Interstitial thickening in the RIGHT lower lobe. Question
nodular focus 7 mm diameter RIGHT lung base image 110 versus
atelectasis. Bibasilar atelectasis. No acute infiltrate,
pneumothorax, or additional mass.

Upper Abdomen: Visualized upper abdomen normal appearance. Small
splenule incidentally noted.

Musculoskeletal: Scattered degenerative disc disease changes of the
thoracic spine.

Review of the MIP images confirms the above findings.
IMPRESSION: No definite evidence of pulmonary embolism.

Bronchitic changes with minimal RIGHT pleural effusion and
interstitial thickening in the RIGHT lower lobe, question minimal
edema.

Non-contrast chest CT at 6-12 months is recommended. If the nodule
is stable at time of repeat CT, then future CT at 18-24 months (from
today's scan) is considered optional for low-risk patients, but is
recommended for high-risk patients. This recommendation follows the
consensus statement: Guidelines for Management of Incidental
Pulmonary Nodules Detected on CT Images: From the [HOSPITAL]

Upper normal caliber of the ascending thoracic aorta 3.9 cm
diameter.

Aortic Atherosclerosis ([J5]-[J5]).

## 2019-09-18 IMAGING — DX DG CHEST 2V
2 series · 2 of 2 positions shown · non-contrast
Comparison: [DATE]

CLINICAL DATA: Shortness of breath and cough for several days

EXAM:
CHEST - 2 VIEW

[chest pa]
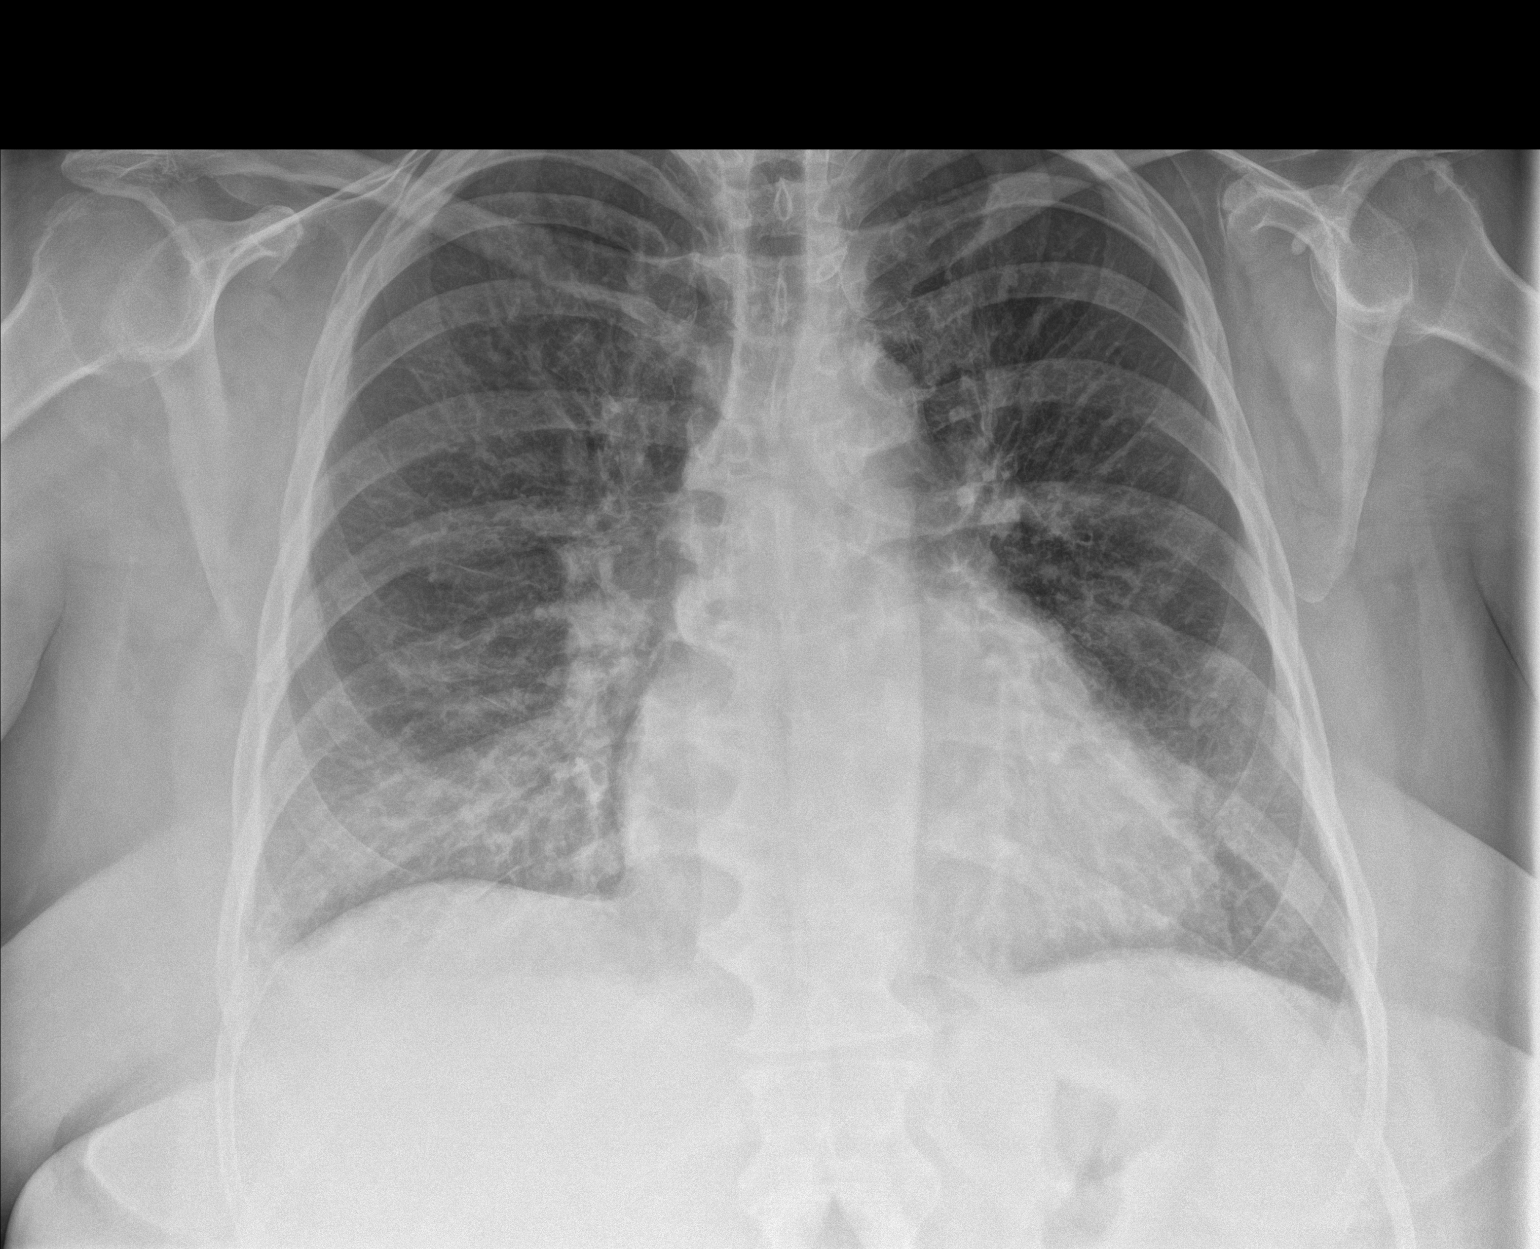

[chest lat]
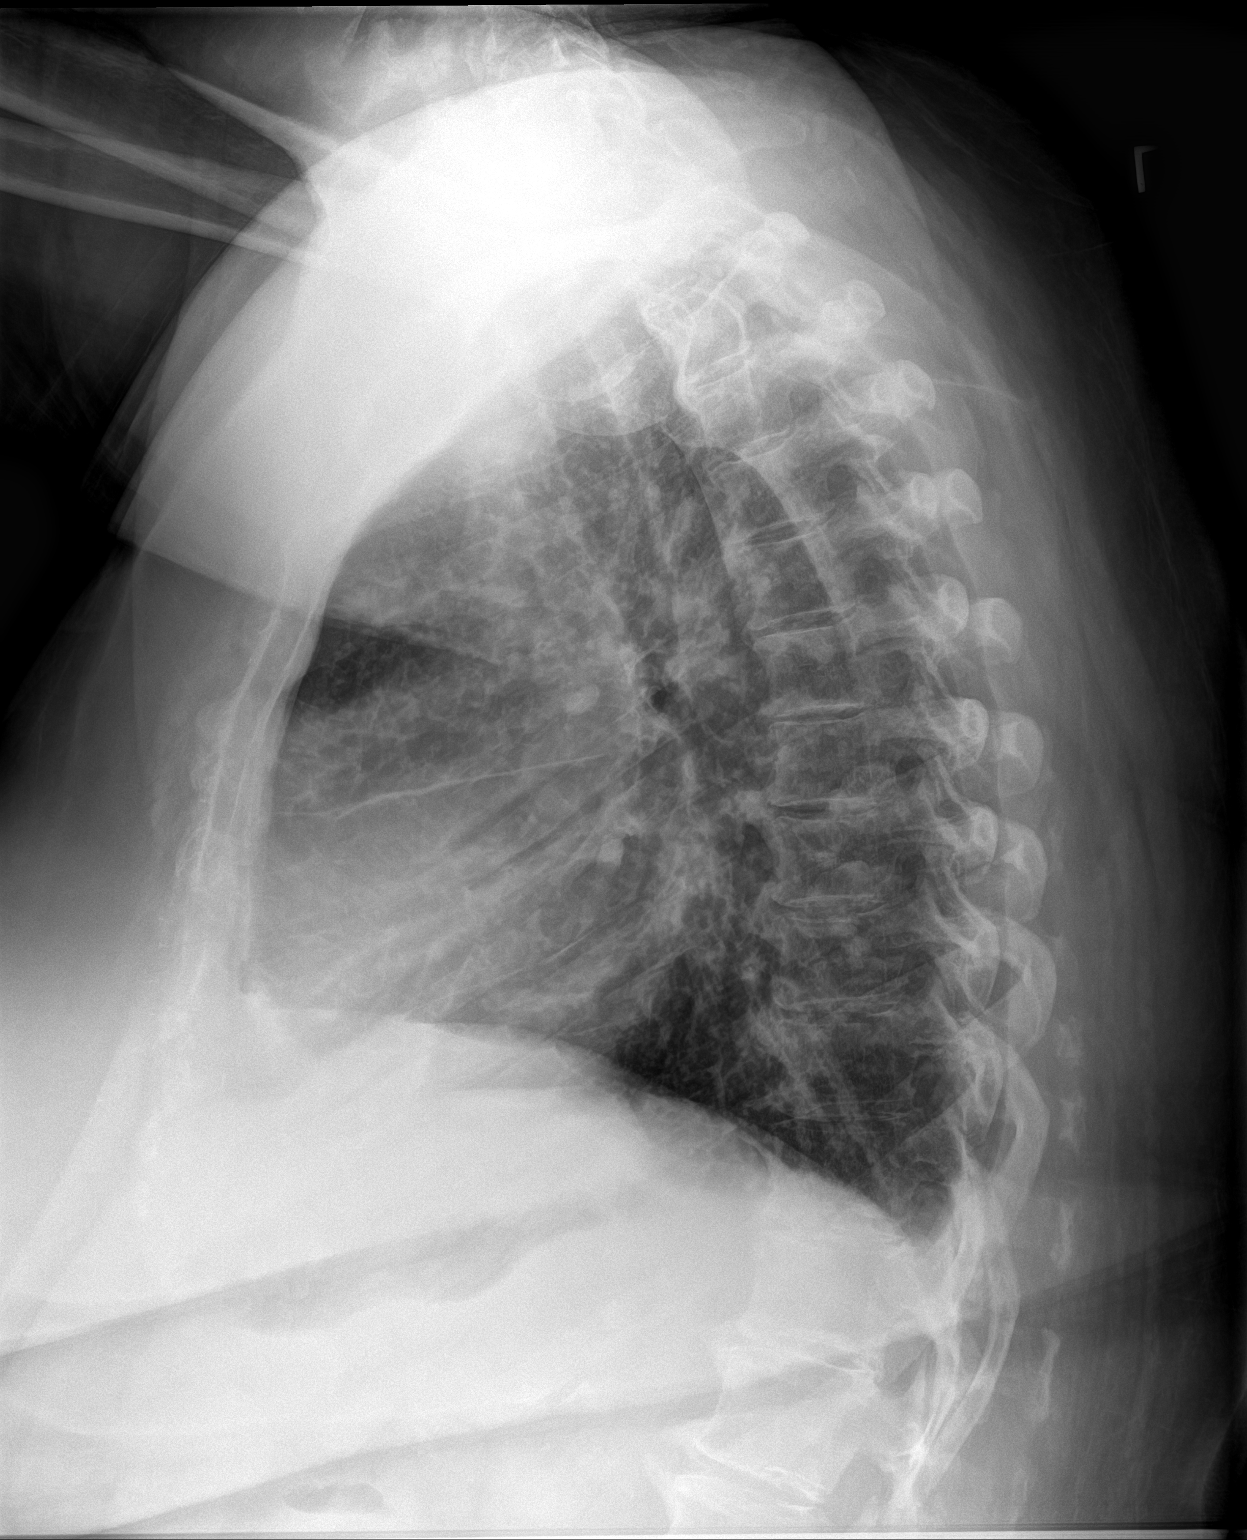

[2 of 2 positions shown; findings below may reference images not displayed]

FINDINGS: Cardiac shadow is at the upper limits of normal in size but stable.
Aortic calcifications are again seen. The lungs are well aerated
with mild patchy bibasilar atelectatic changes. These have increased
particularly in the right lung base. Mild interstitial prominence
remains likely of a chronic nature. Degenerative changes of the
thoracic spine are seen.
IMPRESSION: Bibasilar opacities increased on the right from the prior exam
likely related to underlying atelectatic change or early infiltrate.

## 2019-09-18 MED ORDER — HYDROCODONE-ACETAMINOPHEN 5-325 MG PO TABS
1.0000 | ORAL_TABLET | Freq: Once | ORAL | Status: AC
  Administered 2019-09-18: 1 via ORAL
  Filled 2019-09-18 (×2): qty 1

## 2019-09-18 MED ORDER — ACETAMINOPHEN 500 MG PO TABS
1000.0000 mg | ORAL_TABLET | Freq: Once | ORAL | Status: AC
  Administered 2019-09-18: 1000 mg via ORAL
  Filled 2019-09-18: qty 2

## 2019-09-18 MED ORDER — IOHEXOL 350 MG/ML SOLN
100.0000 mL | Freq: Once | INTRAVENOUS | Status: AC | PRN
  Administered 2019-09-18: 100 mL via INTRAVENOUS

## 2019-09-18 NOTE — Telephone Encounter (Signed)
Will forward to Dr. Koneswaran to advise.  

## 2019-09-18 NOTE — Telephone Encounter (Signed)
Check BNP, BMET, and chest xray. Copy results to PCP. Cardiac function is normal and she has normal coronary arteries. This may represent a pulmonary process.

## 2019-09-18 NOTE — ED Triage Notes (Signed)
Pt c/o SOB, headache that started Sunday. Pt is unable to lie flat without being able to breathe well, bilateral lowe extremity swelling. She feels her heart is flipping in and out of A. Fib. Pt recently hospitalized for heart issues.   EKG given to Dr. Reather Converse.

## 2019-09-18 NOTE — Telephone Encounter (Signed)
Pt has put on 5lbs this week, left foot has been swollen since Monday- has been keeping it elevated and it's still staying swollen. States having SOB and can't lay flat- been having to sleep sitting up and she has a cough.

## 2019-09-18 NOTE — Discharge Instructions (Addendum)
Extensive work-up without significant causes for the shortness of breath.  However close follow-up with pulmonary medicine recommended.  Return for any new or worse symptoms.  CT scan did show a right-sided pleural effusion that is small.  Also do recommend taking an extra dose of your Demadex in the afternoon.

## 2019-09-18 NOTE — ED Provider Notes (Signed)
[facility] EMERGENCY DEPARTMENT Provider Note   CSN: [id number] Arrival date & time: 09/18/19  1439     History   Chief Complaint Chief Complaint  Patient presents with  . Shortness of Breath    HPI Laura [name] is a 58 y.o. female.     Patient presenting with a complaint of exertional shortness of breath for about a week.  Also had a headache mild that started on Sunday.  No fevers.  Some bilateral leg swelling.  She also felt as if her heart was irregular.  Patient recently had cardiac cath without any significant coronary artery disease.  Followed by Dr. Raliegh Ip.  Also followed by pulmonology.  Patient states that the shortness of breath is exertional.  Also has feels some shortness of breath when laying flat.  No chest pain.  Does have some bilateral leg swelling.     Past Medical History:  Diagnosis Date  . Asthma   . Atrial fibrillation ([place])   . Bipolar disorder ([place])   . Bronchiectasis ([place])   . CHF (congestive heart failure) ([place])   . Complication of anesthesia    "my Dr in [place] told me I had an allergic reaction to the combination of the pain meds and the anesthesia" she does not remember what happened but "I eneded up in the ICU for 5 days".  . H/O congenital atrial septal defect (ASD) repair   . Hypogammaglobulinemia ([place])   . Hypothyroid   . 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 Neuropathy   . Osteoarthritis   . PTSD (post-traumatic stress disorder)   . Recurrent sinusitis   . Short-term memory loss   . Sleep apnea   . TIA (transient ischemic attack)   . Tremors of nervous system    seeing PCP for this.  . Type 2 diabetes mellitus ([place])   . Urticaria   . Wound infection     Patient Active Problem List   Diagnosis Date Noted  . Abnormal EKG   . Acute on chronic diastolic CHF (congestive heart failure) ([place]) 08/29/2019  . Shortness of breath 08/29/2019  . Chest pain 08/28/2019  . Atypical chest pain 08/10/2018  . Bipolar disorder ([place]) 08/10/2018  .  Headache 08/10/2018  . H/O atrial flutter 08/10/2018  . OSA on CPAP 08/10/2018  . Anxiety 08/10/2018  . Uncontrolled type 2 diabetes mellitus with hyperglycemia ([place]) 03/13/2018  . Hypothyroidism 03/13/2018  . Essential hypertension, benign 03/13/2018  . Mixed hyperlipidemia 03/13/2018  . Hypogammaglobulinemia ([place]) 02/06/2018  . Non-allergic rhinitis 02/06/2018  . Severe persistent asthma, uncomplicated [id number]  . Pulmonary nodules 02/06/2018  . Bronchiectasis without complication ([place]) [id number]  . DM type 2 causing vascular disease ([place]) 02/06/2018    Past Surgical History:  Procedure Laterality Date  . 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 CARDIAC SURGERY    . CARDIOVERSION    . CARDIOVERSION N/A 08/29/2018   Procedure: CARDIOVERSION;  Surgeon: Arnoldo Lenis, MD;  Location[facility];  Service: Endoscopy;  Laterality: N/A;  . [place]  . LEFT HEART CATH AND CORONARY ANGIOGRAPHY N/A 08/30/2019   Procedure: LEFT HEART CATH AND CORONARY ANGIOGRAPHY;  Surgeon: [name], MD;  Location: [facility];  Service: Cardiovascular;  Laterality: N/A;  . NASAL SINUS SURGERY  2017     OB History    Gravida  3   Para  3   Term      Preterm  3   AB  Living  3     SAB      TAB      Ectopic      Multiple      Live Births               Home Medications    Prior to Admission medications   Medication Sig Start Date End Date Taking? Authorizing Provider  acetaminophen (TYLENOL) 325 MG tablet Take 2 tablets (650 mg total) by mouth every 6 (six) hours as needed for mild pain, fever or headache. 08/11/18   Roxan Hockey, MD  Albuterol Sulfate (PROAIR RESPICLICK) [id number] (90 Base) MCG/ACT AEPB Inhale 1 puff into the lungs every 6 (six) hours as needed (for shortness of breath/wheezing).     [provider]  apixaban (ELIQUIS) 5 MG TABS tablet Take 5 mg by mouth 2 (two) times daily.    [provider]  atorvastatin  (LIPITOR) 10 MG tablet Take 10 mg by mouth every evening.     [provider]  diltiazem (CARDIZEM CD) 300 MG 24 hr capsule Take 1 capsule (300 mg total) by mouth every morning. 08/16/19   Herminio Commons, MD  divalproex (DEPAKOTE ER) 500 MG 24 hr tablet Take 3 tablets (1,500 mg total) by mouth 2 (two) times daily. 1000 mg in AM, 1500 mg in PM Patient taking differently: Take 1,000-1,500 mg by mouth See admin instructions. 1000 mg in AM, 1500 mg in PM 10/10/19   Hisada, Elie Goody, MD  doxycycline (VIBRA-TABS) 100 MG tablet Take 1 tablet (100 mg total) by mouth every 12 (twelve) hours. 08/31/19   Bhagat, Crista Luria, PA  Dulaglutide (TRULICITY) 1.5 0000000 SOPN Inject 1.5 mg into the skin every Sunday.     [provider]  gabapentin (NEURONTIN) 600 MG tablet Take 600 mg by mouth 3 (three) times daily.    [provider]  HUMALOG KWIKPEN 100 UNIT/ML KwikPen Inject 7 Units into the skin 3 (three) times daily. Sliding scale. 08/28/18   [provider]  hydrALAZINE (APRESOLINE) 25 MG tablet Take 25 mg by mouth 3 (three) times daily.    [provider]  hydrOXYzine (ATARAX/VISTARIL) 25 MG tablet Take 1 tablet (25 mg total) by mouth every 6 (six) hours as needed for anxiety or nausea. 08/11/18   Roxan Hockey, MD  Immune Globulin, Human, (CUVITRU) 4 GM/20ML SOLN Inject 100 mLs into the skin every 14 (fourteen) days.    [provider]  insulin glargine (LANTUS) 100 UNIT/ML injection Inject 60 Units into the skin at bedtime.    [provider]  lamoTRIgine (LAMICTAL) 25 MG tablet Take 1 tablet (25 mg total) by mouth daily. 08/16/19   [name], MD  LANTUS SOLOSTAR 100 UNIT/ML Solostar Pen SMARTSIG:50 Unit(s) SUB-Q Daily 08/15/19   [provider]  levothyroxine (SYNTHROID, LEVOTHROID) 112 MCG tablet Take 112 mcg by mouth daily before breakfast.    [provider]  Melatonin 3 MG TABS Take 3 mg by mouth at bedtime.     [provider]  metFORMIN (GLUCOPHAGE-XR) 500 MG 24 hr tablet Take 1,000 mg by mouth 2 (two) times daily.  04/11/18   [provider]  mometasone-formoterol (DULERA) 200-5 MCG/ACT AERO Inhale 2 puffs into the lungs 2 (two) times daily. 06/21/19   Valentina Shaggy, MD  ondansetron (ZOFRAN) 4 MG tablet Take 1 tablet (4 mg total) by mouth every 8 (eight) hours as needed for nausea or vomiting. 07/13/19   Chevis Pretty, FNP  tiotropium (SPIRIVA) 18  MCG inhalation capsule Place 18 mcg into inhaler and inhale daily as needed (for breathing).     [provider]  torsemide (DEMADEX) 20 MG tablet Take 60 mg by mouth daily.    [provider]  VENTOLIN HFA 108 (90 Base) MCG/ACT inhaler INHALE 2 PUFFS INTO THE LUNGS EVERY 6 HOURS AS NEEDED FOR WHEEZING OR SHORTNESS OF BREATH 08/12/19   Valentina Shaggy, MD    Family History Family History  Problem Relation Age of Onset  . Hypertension Mother   . Stroke Mother   . Kidney disease Mother   . Heart attack Mother   . Alcohol abuse Mother   . Kidney disease Sister   . Heart disease Brother   . Stroke Maternal Aunt   . Heart disease Maternal Aunt   . Hypertension Sister   . Bipolar disorder Sister   . Thyroid disease Sister   . Thyroid disease Daughter   . Thyroid disease Daughter   . Hashimoto's thyroiditis Daughter   . Celiac disease Daughter   . Allergic rhinitis Daughter   . Thyroid disease Daughter   . Asthma Neg Hx     Social History Social History   Tobacco Use  . Smoking status: Former Smoker    Packs/day: 1.00    Years: 17.00    Pack years: 17.00    Types: Cigarettes    Quit date: 2008    Years since quitting: 12.9  . Smokeless tobacco: Never Used  Substance Use Topics  . Alcohol use: Not Currently  . Drug use: Not Currently     Allergies   Ativan [lorazepam] and Phenergan [promethazine hcl]   Review of Systems Review of Systems  Constitutional: Negative for chills and  fever.  HENT: Negative for congestion, rhinorrhea and sore throat.   Eyes: Negative for visual disturbance.  Respiratory: Positive for shortness of breath. Negative for cough.   Cardiovascular: Positive for leg swelling. Negative for chest pain.  Gastrointestinal: Negative for abdominal pain, diarrhea, nausea and vomiting.  Genitourinary: Negative for dysuria.  Musculoskeletal: Negative for back pain and neck pain.  Skin: Negative for rash.  Neurological: Negative for dizziness, light-headedness and headaches.  Hematological: Does not bruise/bleed easily.  Psychiatric/Behavioral: Negative for confusion.     Physical Exam Updated Vital Signs BP (!) 146/54   Pulse 90   Temp 98.5 F (36.9 C) (Oral)   Resp (!) 23   Ht 1.626 m (5\' 4" )   Wt 111.1 kg   SpO2 95%   BMI 42.05 kg/m   Physical Exam Vitals signs and nursing note reviewed.  Constitutional:      General: She is not in acute distress.    Appearance: She is well-developed.  HENT:     Head: Normocephalic and atraumatic.  Eyes:     Conjunctiva/sclera: Conjunctivae normal.  Neck:     Musculoskeletal: Normal range of motion and neck supple.  Cardiovascular:     Rate and Rhythm: Normal rate and regular rhythm.     Heart sounds: No murmur.  Pulmonary:     Effort: Pulmonary effort is normal. No respiratory distress.     Breath sounds: Normal breath sounds. No wheezing.  Abdominal:     Palpations: Abdomen is soft.     Tenderness: There is no abdominal tenderness.  Musculoskeletal:        General: Swelling present.  Skin:    General: Skin is warm and dry.     Capillary Refill: Capillary refill takes less than 2  seconds.  Neurological:     General: No focal deficit present.     Mental Status: She is alert and oriented to person, place, and time.     Cranial Nerves: No cranial nerve deficit.     Sensory: No sensory deficit.     Motor: No weakness.      ED Treatments / Results  Labs (all labs ordered are listed,  but only abnormal results are displayed) Labs Reviewed  CBC WITH DIFFERENTIAL/PLATELET - Abnormal; Notable for the following components:      Result Value   RBC 3.70 (*)    All other components within normal limits  COMPREHENSIVE METABOLIC PANEL - Abnormal; Notable for the following components:   Glucose, Bld 113 (*)    BUN 24 (*)    All other components within normal limits  BRAIN NATRIURETIC PEPTIDE - Abnormal; Notable for the following components:   B Natriuretic Peptide 208.0 (*)    All other components within normal limits    EKG EKG Interpretation  Date/Time:  Wednesday September 18 2019 15:08:46 EST Ventricular Rate:  88 PR Interval:    QRS Duration: 100 QT Interval:  418 QTC Calculation: 505 R Axis:   76 Text Interpretation: Atrial flutter with variable A-V block ST elevation consider inferior injury or acute infarct Prolonged QT Abnormal ECG No significant change since last tracing Confirmed by Fredia Sorrow [phone]) on 09/18/2019 5:01:31 PM   Radiology Dg Chest 2 View  Result Date: 09/18/2019 CLINICAL DATA:  Shortness of breath and cough for several days EXAM: CHEST - 2 VIEW COMPARISON:  08/28/2019 FINDINGS: Cardiac shadow is at the upper limits of normal in size but stable. Aortic calcifications are again seen. The lungs are well aerated with mild patchy bibasilar atelectatic changes. These have increased particularly in the right lung base. Mild interstitial prominence remains likely of a chronic nature. Degenerative changes of the thoracic spine are seen. IMPRESSION: Bibasilar opacities increased on the right from the prior exam likely related to underlying atelectatic change or early infiltrate. Electronically Signed   By: Inez Catalina M.D.   On: 09/18/2019 15:51   Ct Angio Chest Pe W/cm &/or Wo Cm  Result Date: 09/18/2019 CLINICAL DATA:  Has put on 5 pounds this week, LEFT foot swelling since Monday, shortness of breath, orthopnea, sleeping sitting up, cough,  question pulmonary embolism EXAM: CT ANGIOGRAPHY CHEST WITH CONTRAST TECHNIQUE: Multidetector CT imaging of the chest was performed using the standard protocol during bolus administration of intravenous contrast. Multiplanar CT image reconstructions and MIPs were obtained to evaluate the vascular anatomy. CONTRAST:  145mL OMNIPAQUE IOHEXOL 350 MG/ML SOLN IV COMPARISON:  High-resolution CT chest 12/21/2018 FINDINGS: Cardiovascular: Atherosclerotic calcifications aorta, proximal great vessels, minimally in coronary arteries. Ascending thoracic aorta borderline dilated 3.9 cm diameter. No aortic dissection. Heart enlarged. No pericardial effusion. Pulmonary arteries well opacified. A questionable filling defect on axial images in the RIGHT pulmonary artery series 5 image 119 appears to represent a volume averaging artifact with a small lymph nodeand is not confirmed on sagittal and coronal views. No definite pulmonary emboli identified. Mediastinum/Nodes: Esophagus unremarkable. Minimally enlarged precarinal lymph node 12 mm short axis image 36. No thoracic adenopathy otherwise seen. Base of cervical region normal appearance. Lungs/Pleura: Peribronchial thickening. Minimal RIGHT pleural effusion. Interstitial thickening in the RIGHT lower lobe. Question nodular focus 7 mm diameter RIGHT lung base image 110 versus atelectasis. Bibasilar atelectasis. No acute infiltrate, pneumothorax, or additional mass. Upper Abdomen: Visualized upper abdomen normal appearance. Small splenule  incidentally noted. Musculoskeletal: Scattered degenerative disc disease changes of the thoracic spine. Review of the MIP images confirms the above findings. IMPRESSION: No definite evidence of pulmonary embolism. Bronchitic changes with minimal RIGHT pleural effusion and interstitial thickening in the RIGHT lower lobe, question minimal edema. Non-contrast chest CT at 6-12 months is recommended. If the nodule is stable at time of repeat CT, then  future CT at 18-24 months (from today's scan) is considered optional for low-risk patients, but is recommended for high-risk patients. This recommendation follows the consensus statement: Guidelines for Management of Incidental Pulmonary Nodules Detected on CT Images: From the Fleischner Society 2017; Radiology 2017; 284:228-243. Upper normal caliber of the ascending thoracic aorta 3.9 cm diameter. Aortic Atherosclerosis (ICD10-I70.0). Electronically Signed   By: Lavonia Dana M.D.   On: 09/18/2019 18:27   Dg Chest Port 1 View  Result Date: 09/18/2019 CLINICAL DATA:  Shortness of breath EXAM: PORTABLE CHEST 1 VIEW COMPARISON:  Films from earlier in the same day. FINDINGS: Cardiac shadow is at the upper limits of normal in size. Aortic calcifications are noted. Lungs are well aerated bilaterally. Some improved aeration is noted in the right lung base. Patchy opacities are noted peripherally in the left lung base. No bony abnormality is noted. IMPRESSION: Patchy opacities in the left lung base. Atypical pneumonia could present in this fashion although this may simply represent atelectatic change given the slight increase in a short period of time from the prior exam. Electronically Signed   By: Inez Catalina M.D.   On: 09/18/2019 15:48    Procedures Procedures (including critical care time)  Medications Ordered in ED Medications  acetaminophen (TYLENOL) tablet 1,000 mg (1,000 mg Oral Given 09/18/19 1634)  HYDROcodone-acetaminophen (NORCO/VICODIN) 5-325 MG per tablet 1 tablet (1 tablet Oral Given 09/18/19 1813)  iohexol (OMNIPAQUE) 350 MG/ML injection 100 mL (100 mLs Intravenous Contrast Given 09/18/19 1749)     Initial Impression / Assessment and Plan / ED Course  I have reviewed the triage vital signs and the nursing notes.  Pertinent labs & imaging results that were available during my care of the patient were reviewed by me and considered in my medical decision making (see chart for details).     Patient with 1 week history of exertional shortness of breath.  Patient just recently had cardiac cath without any acute coronary disease.  Patient's EKG here with rate controlled atrial flutter.  Patient is on medication for this.  On Eliquis.  CT scan was done based on chest x-ray which raise concerned about infiltrate.  But CT scan showed a minimal right-sided pleural effusion no other significant abnormalities no pulmonary embolus.  Patient could maybe have some mild congestion.  Patient is on Demadex.  Will recommend an extra dose in the afternoon.  Will have patient follow-up with pulmonary medicine.  Labs and symptoms not consistent with COVID-19 infection.  Exact cause of patient's shortness of breath not clear.      Final Clinical Impressions(s) / ED Diagnoses   Final diagnoses:  SOB (shortness of breath)  Pleural effusion on right    ED Discharge Orders    None       Fredia Sorrow, MD 09/18/19 2013

## 2019-09-18 NOTE — Telephone Encounter (Signed)
Pt made aware. Placed orders

## 2019-09-19 NOTE — Telephone Encounter (Signed)
Please give pt a call concerning a Rx that Lattie Haw was going to send in yesterday.

## 2019-09-20 ENCOUNTER — Ambulatory Visit (INDEPENDENT_AMBULATORY_CARE_PROVIDER_SITE_OTHER): Payer: Medicare Other | Admitting: Allergy & Immunology

## 2019-09-20 ENCOUNTER — Encounter (HOSPITAL_COMMUNITY): Payer: Self-pay | Admitting: Licensed Clinical Social Worker

## 2019-09-20 ENCOUNTER — Encounter: Payer: Self-pay | Admitting: Allergy & Immunology

## 2019-09-20 ENCOUNTER — Ambulatory Visit (INDEPENDENT_AMBULATORY_CARE_PROVIDER_SITE_OTHER): Payer: Medicare Other | Admitting: Licensed Clinical Social Worker

## 2019-09-20 ENCOUNTER — Other Ambulatory Visit: Payer: Self-pay

## 2019-09-20 VITALS — BP 142/86 | HR 101 | Resp 20 | Ht 64.0 in

## 2019-09-20 DIAGNOSIS — R0602 Shortness of breath: Secondary | ICD-10-CM

## 2019-09-20 DIAGNOSIS — J4550 Severe persistent asthma, uncomplicated: Secondary | ICD-10-CM

## 2019-09-20 DIAGNOSIS — I480 Paroxysmal atrial fibrillation: Secondary | ICD-10-CM

## 2019-09-20 DIAGNOSIS — J479 Bronchiectasis, uncomplicated: Secondary | ICD-10-CM

## 2019-09-20 DIAGNOSIS — R918 Other nonspecific abnormal finding of lung field: Secondary | ICD-10-CM

## 2019-09-20 DIAGNOSIS — J310 Chronic rhinitis: Secondary | ICD-10-CM | POA: Diagnosis not present

## 2019-09-20 DIAGNOSIS — F4310 Post-traumatic stress disorder, unspecified: Secondary | ICD-10-CM

## 2019-09-20 DIAGNOSIS — F319 Bipolar disorder, unspecified: Secondary | ICD-10-CM | POA: Diagnosis not present

## 2019-09-20 DIAGNOSIS — D839 Common variable immunodeficiency, unspecified: Secondary | ICD-10-CM

## 2019-09-20 MED ORDER — LEVOFLOXACIN 750 MG PO TABS: 750.0000 mg | ORAL_TABLET | Freq: Every day | ORAL | 0 refills | Status: DC

## 2019-09-20 MED ORDER — LEVOFLOXACIN 500 MG PO TABS: 500.0000 mg | ORAL_TABLET | Freq: Every day | ORAL | 0 refills | Status: DC

## 2019-09-20 MED ORDER — LEVOFLOXACIN 500 MG PO TABS: 500.0000 mg | ORAL_TABLET | Freq: Every day | ORAL | 0 refills | Status: AC

## 2019-09-20 NOTE — Progress Notes (Signed)
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 Bronson Ing  Essential tremor  Congestive heart failure- sees Dr. Bronson Ing   Plan/Recommendations:   1. Hypogammaglobulinemia - Continue with Cuvitru at the same dose. - Do not get behind on the infusions. - Start Levaquin daily for seven days. - Email or call me with an update: [email or web address] or - [phone]  2. Chronic rhinitis   - Continue with nasal saline rinses as needed.   3. Severe persistent asthma, uncomplicated - Lung function looked stable. - I agree with you, I think a lot of these breathing problems have more to do with the weight gain and other issues. - Let's give the Berna Bue another change to work. - Call Manuela Schwartz at [facility] at [phone] to get the delivery set up again.  - Daily controller medication(s): Dulera 200/66mcg two puffs twice daily with spacer and Spiriva 1.73mcg two puffs once daily and Fasenra monthly for one more dose and then every 8 weeks - Prior to physical activity: ProAir 2 puffs 10-15 minutes before physical activity. - Rescue medications: ProAir 4 puffs every 4-6 hours as needed, albuterol nebulizer one vial every 4-6 hours as needed or DuoNeb nebulizer one vial every 4-6 hours as needed - Asthma control goals:  * Full participation in all desired activities (may need albuterol before activity) * Albuterol use two time or less a week on average (not counting use with activity) * Cough  interfering with sleep two time or less a month * Oral steroids no more than once a year * No hospitalizations.  4. Follow up in three months or earlier if needed. This can be an in-person, a virtual Webex or a telephone follow up visit.   Subjective:   Aliaha Edison is a 58 y.o. female presenting today for follow up of  Chief Complaint  Patient presents with  . Shortness of Breath  . Asthma    Arabell Hovorka has a history of the following: Patient Active Problem List   Diagnosis Date Noted  . Abnormal EKG   . Acute on chronic diastolic CHF (congestive heart failure) ([place]) 08/29/2019  . Shortness of breath 08/29/2019  . Chest pain 08/28/2019  . Atypical chest pain 08/10/2018  . Bipolar disorder ([place]) 08/10/2018  . Headache 08/10/2018  . H/O atrial flutter 08/10/2018  . OSA on CPAP 08/10/2018  . Anxiety 08/10/2018  . Uncontrolled type 2 diabetes mellitus with hyperglycemia ([place]) 03/13/2018  . Hypothyroidism 03/13/2018  . Essential hypertension, benign 03/13/2018  . Mixed hyperlipidemia 03/13/2018  . Hypogammaglobulinemia ([place]) 02/06/2018  . Non-allergic rhinitis 02/06/2018  . Severe persistent asthma, uncomplicated [id number]  . Pulmonary nodules 02/06/2018  . Bronchiectasis without complication ([place]) [id number]  . DM type 2 causing vascular disease ([place]) 02/06/2018    History obtained from: chart review and patient.  Huguette is a 58 y.o. female presenting for a follow up visit.  She was last seen in September 2020.  At that time, we continued with Cuvitru at the same dose.  Her latest IgG level was normal.  For her chronic rhinitis, we continue with nasal saline rinses  as needed.  Her asthma was controlled with Dulera 200 mcg 2 puffs twice daily in combination with Spiriva 1.25 mcg 2 puffs once daily.  She did feel that her weight gain was contributing to her shortness of breath, but she was willing to give benralizumab another chance.  She did restart this, but has since  stopped it in the meantime.  Since the last visit, she reports that she was doing very well.  She had actually started a ketogenic diet which was helping with weight loss.  However, around 1 month ago she presented with shortness of breath and was admitted to the hospital for a CHF exacerbation. She was placed on diuretics and was actually discharged on torsemide. She was in the hospital for three days (November 18th through November 21st). Prior to admission, her BNP was elevated at 715 and her CRP was elevated at 5.8. Her troponin was 114 with a repeat of 95 initially. She had an EKG that demonstrated atrial fibrillation. COVID was negative. A cath showed no significant CAD. She was placed on anticoagulation for three weeks. Flecanide was considered versus ablation. Eliquis was added back on. Echocardiogram demonstrated normal LV function with an EF of 60-65%. She completed both systemic steroids and a one week course of doxycycline.   Despite all of these diuretic and blood pressure medication changes, she has continued to have episodes of shortness of breath. She went to the ED on December 9th. A chest CT was negative for a PE. She apparently collapsed in the parking lot of [facility].   She remains on her immunoglobulin infusions without a problem. Her main complaint today is concern for a pulmonary infection despite the course of doxycycline. She is requesting an antibiotic today, at least to have on hand if needed. She remains on her nasal saline rinses.   Despite all of her asthma medications, she continues to have shortness of breath. She is on her Dulera as well as her Spiriva. She did stop her Berna Bue due to lack of improvement symptomatically.   Otherwise, there have been no changes to her past medical history, surgical history, family history, or social history.    Review of Systems  Constitutional: Negative.  Negative for fever, malaise/fatigue and weight loss.  HENT: Negative.  Negative  for congestion, ear discharge, ear pain and sore throat.   Eyes: Negative for pain, discharge and redness.  Respiratory: Positive for cough, sputum production and shortness of breath. Negative for wheezing.   Cardiovascular: Negative.  Negative for chest pain and palpitations.  Gastrointestinal: Negative for abdominal pain, constipation, diarrhea, heartburn, nausea and vomiting.  Skin: Negative.  Negative for itching and rash.  Neurological: Negative for dizziness and headaches.  Endo/Heme/Allergies: Negative for environmental allergies. Does not bruise/bleed easily.       Objective:   Blood pressure (!) 142/86, pulse (!) 101, resp. rate 20, height 5\' 4"  (1.626 m), SpO2 94 %. Body mass index is 42.05 kg/m.   Physical Exam:  Physical Exam  Constitutional: She appears well-developed.  Talkative, but she does take deep breaths between sentences.   HENT:  Head: Normocephalic and atraumatic.  Right Ear: Tympanic membrane, external ear and ear canal normal.  Left Ear: Tympanic membrane, external ear and ear canal normal.  Nose: Mucosal edema present. No nasal deformity or septal deviation. No epistaxis. Right sinus exhibits no maxillary sinus tenderness and no frontal sinus tenderness. Left sinus exhibits no maxillary sinus tenderness and no frontal sinus tenderness.  Mouth/Throat: Uvula is midline  and oropharynx is clear and moist. Mucous membranes are not pale and not dry.  Eyes: Pupils are equal, round, and reactive to light. Conjunctivae and EOM are normal. Right eye exhibits no chemosis and no discharge. Left eye exhibits no chemosis and no discharge. Right conjunctiva is not injected. Left conjunctiva is not injected.  Cardiovascular: Normal rate, regular rhythm and normal heart sounds.  Respiratory: Effort normal and breath sounds normal. No accessory muscle usage. No tachypnea. No respiratory distress. She has no wheezes. She has no rhonchi. She has no rales. She exhibits no  tenderness.  Decreased air movement at the bases. No increased work of breathing noted.   Lymphadenopathy:    She has no cervical adenopathy.  Neurological: She is alert.  Skin: No abrasion, no petechiae and no rash noted. Rash is not papular, not vesicular and not urticarial. No erythema. No pallor.  Psychiatric: She has a normal mood and affect.     Diagnostic studies: none      Salvatore Marvel, MD  [facility]

## 2019-09-20 NOTE — Patient Instructions (Addendum)
1. Hypogammaglobulinemia - Continue with Cuvitru at the same dose. - Do not get behind on the infusions. - Start Levaquin daily for seven days. - Email or call me with an update: [email or web address] or [phone].  2. Chronic rhinitis   - Continue with nasal saline rinses as needed.   3. Severe persistent asthma, uncomplicated - Lung function deferred today.   - Daily controller medication(s): Dulera 200/63mcg two puffs twice daily with spacer and Spiriva 1.73mcg two puffs once daily - Prior to physical activity: ProAir 2 puffs 10-15 minutes before physical activity. - Rescue medications: ProAir 4 puffs every 4-6 hours as needed, albuterol nebulizer one vial every 4-6 hours as needed or DuoNeb nebulizer one vial every 4-6 hours as needed - Asthma control goals:  * Full participation in all desired activities (may need albuterol before activity) * Albuterol use two time or less a week on average (not counting use with activity) * Cough interfering with sleep two time or less a month * Oral steroids no more than once a year * No hospitalizations.  4. Return in about 4 weeks (around 10/18/2019). This can be an in-person, a virtual Webex or a telephone follow up visit.   Please inform us of any Emergency Department visits, hospitalizations, or changes in symptoms. Call us before going to the ED for breathing or allergy symptoms since we might be able to fit you in for a sick visit. Feel free to contact us anytime with any questions, problems, or concerns.  It was a pleasure to see you again today!  Websites that have reliable patient information: 1. American Academy of Asthma, Allergy, and Immunology: www.aaaai.org 2. Food Allergy Research and Education (FARE): foodallergy.org 3. Mothers of Asthmatics: http://www.asthmacommunitynetwork.org 4. American College of Allergy, Asthma, and Immunology: www.acaai.org  "Like" [place] on Facebook and Instagram for our latest updates!       Make sure you are registered to vote! If you have moved or changed any of your contact information, you will need to get this updated before voting!  In some cases, you MAY be able to register to vote online: [email or web address]

## 2019-09-20 NOTE — Progress Notes (Signed)
Virtual Visit via Video Note  I connected with Laura [name] on 09/20/19 at  9:00 AM EST by a video enabled telemedicine application and verified that I am speaking with the correct person using two identifiers.  I discussed the limitations of evaluation and management by telemedicine and the availability of in person appointments. The patient expressed understanding and agreed to proceed.   Participation Level: Active  Behavioral Response: CasualAlertDepressed  Type of Therapy: Individual Therapy  Treatment Goals addressed: Coping  Interventions: CBT and Solution Focused  Summary: Laura [name] is a 58 y.o. female who presents oriented x5 (person, place, situation, and time), casually dressed, appropriately groomed, average height, overweight, and cooperative to address mood. Patient has a history of medical treatment including hypothyroidism, hypertensions, and diabetes. Patient has a history of mental health treatment including outpatient therapy and medication management. Patient denies homicidal and suicidal ideations. Patient denies psychosis including auditory and visual hallucinations. Patient denies substance abuse. Patient is at low risk for lethality at this time.  Physically: Patient has been sick since Oct. She had to have fluid drained from her heart and she is having difficulty with breathing. She has upcoming doctors appointments to address this. Patient has not been able to work out and is not eating as clean which has impacted her mood.   Spiritually/values: No issues identified.  Relationships: Patient was told by her daughters that she makes everything about her. She noted this caused an argument with her daughter prior to session. Patient feels like she is allowed to have problems and concerns as well and should be able to talk to others about that. After discussion, patient understood that she needs to have a give and take approach to conversations. Due to having limited  social support, she may come across as wanting to make a conversation about herself but it is really just her trying to discuss and express her needs to another person as well.   Emotionally/Mentally/Behavior:  Patient's mood has been depressed. Patient is frustrated that she has been sick for so long and it has impacted her ability to exercise. Patient has felt like she should be doing more and felt like others were looking at her as if she shouldn't be sitting around all day. After discussion, patient agreed that no one has said anything to her and it is her own inner critic that is causing the disruption in mood. Patient acknowledged that it is not lack of desire but that her body is trying to heal right now from the sickness. She understood that this is the most important area of her life to focus on right now so that she can get back to "normal" life when she heals.   Suicidal/Homicidal: Negativewithout intent/plan  Therapist Response: Therapist reviewed patient's recent thoughts and behaviors. Therapist utilized CBT to address mood. Therapist processed patient's feelings to identify triggers for mood. Therapist discussed patient's physical health and relationships.   Plan: Return again in 3-4 weeks.  Diagnosis: Axis I: Post Traumatic Stress Disorder    Axis II: No diagnosis  I discussed the assessment and treatment plan with the patient. The patient was provided an opportunity to ask questions and all were answered. The patient agreed with the plan and demonstrated an understanding of the instructions.   The patient was advised to call back or seek an in-person evaluation if the symptoms worsen or if the condition fails to improve as anticipated.  I provided 45 minutes of non-face-to-face time during this encounter.  Glori Bickers, LCSW 09/20/2019

## 2019-09-20 NOTE — Telephone Encounter (Signed)
Sent in RX

## 2019-09-24 ENCOUNTER — Ambulatory Visit (INDEPENDENT_AMBULATORY_CARE_PROVIDER_SITE_OTHER): Payer: Medicare Other | Admitting: Pulmonary Disease

## 2019-09-24 ENCOUNTER — Encounter: Payer: Self-pay | Admitting: Allergy & Immunology

## 2019-09-24 ENCOUNTER — Encounter: Payer: Self-pay | Admitting: Pulmonary Disease

## 2019-09-24 ENCOUNTER — Other Ambulatory Visit: Payer: Self-pay

## 2019-09-24 VITALS — BP 148/88 | HR 99 | Temp 97.8°F | Ht 64.0 in | Wt 255.8 lb

## 2019-09-24 DIAGNOSIS — J4551 Severe persistent asthma with (acute) exacerbation: Secondary | ICD-10-CM

## 2019-09-24 DIAGNOSIS — R600 Localized edema: Secondary | ICD-10-CM | POA: Diagnosis not present

## 2019-09-24 DIAGNOSIS — J90 Pleural effusion, not elsewhere classified: Secondary | ICD-10-CM

## 2019-09-24 LAB — BASIC METABOLIC PANEL
BUN: 14 mg/dL (ref 6–23)
CO2: 30 mEq/L (ref 19–32)
Calcium: 9.2 mg/dL (ref 8.4–10.5)
Chloride: 102 mEq/L (ref 96–112)
Creatinine, Ser: 1.06 mg/dL (ref 0.40–1.20)
GFR: 53.28 mL/min — ABNORMAL LOW (ref >60.00)
Glucose, Bld: 102 mg/dL — ABNORMAL HIGH (ref 70–99)
Potassium: 4.1 mEq/L (ref 3.5–5.1)
Sodium: 143 mEq/L (ref 135–145)

## 2019-09-24 MED ORDER — PREDNISONE 10 MG PO TABS: ORAL_TABLET | ORAL | 0 refills | Status: AC

## 2019-09-24 NOTE — Progress Notes (Signed)
Subjective:   PATIENT ID: Laura [name] GENDER: female DOB: 24-Feb-1961, MRN: [id number]   HPI  Chief Complaint  Patient presents with   Follow-up    former BQ patient. shortness of breath all the time, pleural effusion (dec 2020)    Reason for Visit: Follow-up for recent ED visit, shortness of breath and pleural effusion  [name] is a 58 year old female with bronchiectasis, CVID, OSA  Prior Pulmonary notes were reviewed in EMR and summarized as follows: She is a former patient of Dr. [place] [name] and is followed since 02/2018 for bronchiectasis, asthma and OSA. Of note, she has CVID and started on on immunoglobulin replacement since 2018. Due to the pandemic, her last visit with Dr. [place] [name] was in 10/24/2018. Since then she has been compliant with her Dulera. She reports shortness of breath and wheezing. Minimal cough without production. She has taken levofloxacin with partial improvement. She has had lower extremity edema in the last two weeks. Respiratory symptoms worsen with exertion. Denies fevers, chills, chest pain.  Social History: Former smoker. 28 pack years. Quit in 2008.  I have personally reviewed patient's past medical/family/social history, allergies, current medications.  Past Medical History:  Diagnosis Date   Asthma    Atrial fibrillation ([place])    Bipolar disorder ([place])    Bronchiectasis ([place])    CHF (congestive heart failure) ([place])    Complication of anesthesia    "my Dr in [place] told me I had an allergic reaction to the combination of the pain meds and the anesthesia" she does not remember what happened but "I eneded up in the ICU for 5 days".   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   Type 2 diabetes mellitus ([place])    Urticaria    Wound infection      Family History  Problem Relation Age of Onset   Hypertension Mother    Stroke Mother    Kidney disease Mother    Heart attack Mother    Alcohol abuse Mother    Kidney disease Sister    Heart disease Brother    Stroke Maternal Aunt    Heart disease Maternal Aunt    Hypertension Sister    Bipolar disorder Sister    Thyroid disease Sister    Thyroid disease Daughter    Thyroid disease Daughter    Hashimoto's thyroiditis Daughter    Celiac disease Daughter    Allergic rhinitis Daughter    Thyroid disease Daughter    Asthma Neg Hx      Social History   Occupational History   Not on file  Tobacco Use   Smoking status: Former Smoker    Packs/day: 1.00    Years: 17.00    Pack years: 17.00    Types: Cigarettes    Quit date: 2008    Years since quitting: 12.9   Smokeless tobacco: Never Used  Substance and Sexual Activity   Alcohol use: Not Currently   Drug use: Not Currently   Sexual activity: Yes    Birth control/protection: None    Allergies  Allergen Reactions  Ativan [Lorazepam] Hives   Phenergan [Promethazine Hcl] Hives     Outpatient Medications Prior to Visit  Medication Sig Dispense Refill   acetaminophen (TYLENOL) 325 MG tablet Take 2 tablets (650 mg total) by mouth every 6 (six) hours as needed for mild pain, fever or headache. 30 tablet 1   Albuterol Sulfate (PROAIR RESPICLICK) 123XX123 (90 Base) MCG/ACT AEPB Inhale 1 puff into the lungs every 6 (six) hours as needed (for shortness of breath/wheezing).      apixaban (ELIQUIS) 5 MG TABS tablet Take 5 mg by mouth 2 (two) times daily.     atorvastatin (LIPITOR) 10 MG tablet Take 10 mg by mouth every evening.      diltiazem (CARDIZEM CD) 300 MG 24 hr capsule Take 1 capsule (300 mg total) by mouth every morning. 90 capsule 3   [START ON  10/10/2019] divalproex (DEPAKOTE ER) 500 MG 24 hr tablet Take 3 tablets (1,500 mg total) by mouth 2 (two) times daily. 1000 mg in AM, 1500 mg in PM (Patient taking differently: Take 1,000-1,500 mg by mouth See admin instructions. 1000 mg in AM, 1500 mg in PM) 450 tablet 0   doxycycline (VIBRA-TABS) 100 MG tablet Take 1 tablet (100 mg total) by mouth every 12 (twelve) hours. 10 tablet 0   Dulaglutide (TRULICITY) 1.5 0000000 SOPN Inject 1.5 mg into the skin every Sunday.      gabapentin (NEURONTIN) 600 MG tablet Take 600 mg by mouth 3 (three) times daily.     HUMALOG KWIKPEN 100 UNIT/ML KwikPen Inject 7 Units into the skin 3 (three) times daily. Sliding scale.     hydrALAZINE (APRESOLINE) 25 MG tablet Take 25 mg by mouth 3 (three) times daily.     hydrOXYzine (ATARAX/VISTARIL) 25 MG tablet Take 1 tablet (25 mg total) by mouth every 6 (six) hours as needed for anxiety or nausea. 30 tablet 0   Immune Globulin, Human, (CUVITRU) 4 GM/20ML SOLN Inject 100 mLs into the skin every 14 (fourteen) days.     insulin glargine (LANTUS) 100 UNIT/ML injection Inject 60 Units into the skin at bedtime.     lamoTRIgine (LAMICTAL) 25 MG tablet Take 1 tablet (25 mg total) by mouth daily. 90 tablet 0   LANTUS SOLOSTAR 100 UNIT/ML Solostar Pen SMARTSIG:50 Unit(s) SUB-Q Daily     levofloxacin (LEVAQUIN) 500 MG tablet Take 1 tablet (500 mg total) by mouth daily for 7 days. 7 tablet 0   levofloxacin (LEVAQUIN) 750 MG tablet Take 1 tablet (750 mg total) by mouth daily. 7 tablet 0   levothyroxine (SYNTHROID, LEVOTHROID) 112 MCG tablet Take 112 mcg by mouth daily before breakfast.     Melatonin 3 MG TABS Take 3 mg by mouth at bedtime.     metFORMIN (GLUCOPHAGE) 500 MG tablet Take 1,000 mg by mouth 2 (two) times daily.     metFORMIN (GLUCOPHAGE-XR) 500 MG 24 hr tablet Take 1,000 mg by mouth 2 (two) times daily.   0   mometasone-formoterol (DULERA) 200-5 MCG/ACT AERO Inhale 2 puffs into the lungs 2 (two) times  daily. 13 g 5   ondansetron (ZOFRAN) 4 MG tablet Take 1 tablet (4 mg total) by mouth every 8 (eight) hours as needed for nausea or vomiting. 20 tablet 0   tiotropium (SPIRIVA) 18 MCG inhalation capsule Place 18 mcg into inhaler and inhale daily as needed (for breathing).      torsemide (DEMADEX) 20 MG tablet Take 60 mg by mouth daily.     VENTOLIN HFA 108 (  90 Base) MCG/ACT inhaler INHALE 2 PUFFS INTO THE LUNGS EVERY 6 HOURS AS NEEDED FOR WHEEZING OR SHORTNESS OF BREATH 18 g 1   No facility-administered medications prior to visit.    Review of Systems  Constitutional: Positive for malaise/fatigue. Negative for chills, diaphoresis, fever and weight loss.  HENT: Positive for congestion. Negative for ear pain and sore throat.   Respiratory: Positive for cough, sputum production, shortness of breath and wheezing. Negative for hemoptysis.   Cardiovascular: Positive for palpitations and leg swelling. Negative for chest pain.  Gastrointestinal: Positive for nausea. Negative for abdominal pain and heartburn.  Genitourinary: Negative for frequency.  Musculoskeletal: Negative for joint pain and myalgias.  Skin: Negative for itching and rash.  Neurological: Positive for weakness and headaches. Negative for dizziness.  Endo/Heme/Allergies: Bruises/bleeds easily.  Psychiatric/Behavioral: Negative for depression. The patient is nervous/anxious.      Objective:   Vitals:   09/24/19 1138  BP: (!) 148/88  Pulse: 99  Temp: 97.8 F (36.6 C)  TempSrc: Temporal  SpO2: 94%  Weight: 255 lb 12.8 oz (116 kg)  Height: 5\' 4"  (1.626 m)     Physical Exam: General: Well-appearing, no acute distress HENT: [place], AT Eyes: EOMI, no scleral icterus Respiratory: Scattered expiratory wheezes. No rales or crackles Cardiovascular: RRR, -M/R/G, no JVD GI: BS+, soft, nontender Extremities:-Edema,-tenderness Neuro: AAO x4, CNII-XII grossly intact Skin: Intact, no rashes or bruising Psych: Normal mood, normal  affect  Data Reviewed:  Imaging: CTA 09/18/19 - No pulmonary embolism. Nodule ~6mm in right lung base. Minimal right pleural effusion.  PFT: 06/21/19 Spirometry FVC 2.2 (70%) FEV1 1.5 (64%) Ratio 68  Interpretation: Moderate obstructive lung defect is present  Labs: CBC    Component Value Date/Time   WBC 4.8 09/18/2019 1526   RBC 3.70 (L) 09/18/2019 1526   HGB 12.3 09/18/2019 1526   HGB 13.8 03/15/2019 1407   HCT 37.0 09/18/2019 1526   HCT 41.1 03/15/2019 1407   PLT 202 09/18/2019 1526   PLT 180 03/15/2019 1407   MCV 100.0 09/18/2019 1526   MCV 98 (H) 03/15/2019 1407   MCH 33.2 09/18/2019 1526   MCHC 33.2 09/18/2019 1526   RDW 13.1 09/18/2019 1526   RDW 13.1 03/15/2019 1407   LYMPHSABS 1.7 09/18/2019 1526   LYMPHSABS 2.1 03/15/2019 1407   MONOABS 0.4 09/18/2019 1526   EOSABS 0.0 09/18/2019 1526   EOSABS 0.0 03/15/2019 1407   BASOSABS 0.0 09/18/2019 1526   BASOSABS 0.0 03/15/2019 1407   BMET    Component Value Date/Time   NA 143 09/24/2019 1249   NA 140 03/15/2019 1407   K 4.1 09/24/2019 1249   CL 102 09/24/2019 1249   CO2 30 09/24/2019 1249   GLUCOSE 102 (H) 09/24/2019 1249   BUN 14 09/24/2019 1249   BUN 30 (H) 03/15/2019 1407   CREATININE 1.06 09/24/2019 1249   CREATININE 0.51 03/13/2018 1034   CALCIUM 9.2 09/24/2019 1249   GFRNONAA >60 09/18/2019 1526   GFRNONAA 107 03/13/2018 1034   GFRAA >60 09/18/2019 1526   GFRAA 125 03/13/2018 1034    Imaging, labs and tests noted above have been reviewed independently by me.    Assessment & Plan:   Discussion: 58 year old female with bronchiectasis, CVID on immunoglobulin therapy, asthma and OSA who presents for follow-up. Her respiratory symptoms are not well controlled on her current regimen and is likely multifactorial with asthma exacerbation and volume overload. Her symptoms for bronchiectasis are minimal with slight cough but no mucous  production.  Severe Persistent Asthma with exacerbation START  Prednisone taper as instructed CONTINUE Dulera 200-5 mcg TWO puffs TWICE a day CONTINUE Albuterol for shortness of breath or wheezing. RECOMMEND nebulizer use for the next 48-72 hours  Bronchiectasis CONTINUE flutter valve Agree with antibiotic as prescribed  Lower extremity edema Heart failure CONTINUE torsemide as instructed Check kidney function today. If OK, recommend one additional dose of torsemide for diuresis ADDENDUM: Called patient on same day of clinic and advised take additional diuretic  Right pleural effusion No evidence of effusion seen on ultrasound on visit today  RLL lung nodule ~6-40mm Consider repeat CT Chest in June 2021. Discuss at next visit. Likely benign however may warrant following based on size.  Health Maintenance Immunization History  Administered Date(s) Administered   Influenza Split 08/24/2018   Influenza,inj,Quad PF,6+ Mos 07/10/2017   Pneumococcal-Unspecified 10/10/2012   CT Lung Screen - not indicated  Orders Placed This Encounter  Procedures   Basic Metabolic Panel (BMET)    Standing Status:   Future    Number of Occurrences:   1    Standing Expiration Date:   09/23/2020   Meds ordered this encounter  Medications   predniSONE (DELTASONE) 10 MG tablet    Sig: Take 3 tablets (30 mg total) by mouth daily with breakfast for 3 days, THEN 2 tablets (20 mg total) daily with breakfast for 3 days, THEN 1 tablet (10 mg total) daily with breakfast for 3 days.    Dispense:  18 tablet    Refill:  0    Return in about 3 months (around 12/23/2019).  [place], MD [facility] 09/24/2019 11:24 AM  Office Number [phone]

## 2019-09-24 NOTE — Patient Instructions (Signed)
Severe Persistent Asthma with exacerbation START Prednisone taper as instructed CONTINUE Dulera 200-5 mcg TWO puffs TWICE a day CONTINUE Albuterol for shortness of breath or wheezing. RECOMMEND nebulizer use for the next 48-72 hours  Bronchiectasis CONTINUE flutter valve Agree with antibiotic as prescribed  Lower extremity edema Heart failure CONTINUE torsemide as instructed Check kidney function today. If OK, recommend one additional dose of torsemide for diuresis  Right pleural effusion No evidence of effusion seen on ultrasound on visit today

## 2019-09-25 NOTE — Progress Notes (Signed)
Spoke with patient. She is not doing any better. She is still having some breathing problems. Pulmonologist put her on prednisone yesterday along with another dose of antibiotic. She is having difficulty catching her breath. Pulmonologist was tempted to send her to the ED. Patient does have a smartwatch that checks her oxygen and it is currently 94%. Advised to seek medical attention if she feels that she is in respiratory distress.

## 2019-10-01 ENCOUNTER — Telehealth: Payer: Self-pay | Admitting: Pulmonary Disease

## 2019-10-01 ENCOUNTER — Encounter: Payer: Self-pay | Admitting: Pulmonary Disease

## 2019-10-01 DIAGNOSIS — J4551 Severe persistent asthma with (acute) exacerbation: Secondary | ICD-10-CM | POA: Insufficient documentation

## 2019-10-01 DIAGNOSIS — R600 Localized edema: Secondary | ICD-10-CM | POA: Insufficient documentation

## 2019-10-01 NOTE — Telephone Encounter (Signed)
Spoke with pt, states that her sob is improved since 12/15 office visit with JE, but her leg/feet swelling has not improved- in fact the swelling is worse than at her office visit.  Feet are swollen to the point that she cannot wear shoes.  L is more swollen than R, swelling goes up to knees b/l.   Pt is currently taking Torsemide 60mg  qam and 20mg  qpm.  Pt is requesting recs.  Had labs drawn at 12.15 OV and has not received these results yet.    Pharmacy: [facility] in [place].    Sending to APP as JE is not in office today.  Aaron Edelman please advise on recs. Thanks!

## 2019-10-01 NOTE — Telephone Encounter (Signed)
10/01/2019 1020   I am glad to hear the patient's breathing has improved.  Patient will need to follow-up with her cardiologist regarding her torsemide dosing.  Recent lab work is stable.  Does show mild elevation in the BNP which is a lab marker that could be showing that she is retaining fluid.  Her cardiologist should be able to see these labs in the chart.  She needs to continue to take the torsemide as prescribed at this time.  She needs to follow-up with Dr. Bronson Ing.  Patient needs to contact their office.  Wyn Quaker, FNP

## 2019-10-01 NOTE — Telephone Encounter (Signed)
Spoke with pt, aware of recs.  Nothing further needed at this time- will close encounter.   

## 2019-10-01 NOTE — Telephone Encounter (Signed)
See last comment.

## 2019-10-02 ENCOUNTER — Telehealth: Payer: Self-pay | Admitting: Cardiovascular Disease

## 2019-10-02 DIAGNOSIS — R0602 Shortness of breath: Secondary | ICD-10-CM

## 2019-10-02 DIAGNOSIS — R609 Edema, unspecified: Secondary | ICD-10-CM

## 2019-10-02 MED ORDER — METOLAZONE 2.5 MG PO TABS: ORAL_TABLET | ORAL | 0 refills | Status: DC

## 2019-10-02 MED ORDER — POTASSIUM CHLORIDE CRYS ER 20 MEQ PO TBCR: EXTENDED_RELEASE_TABLET | ORAL | 0 refills | Status: DC

## 2019-10-02 NOTE — Telephone Encounter (Signed)
Can take metolazone 2.5 mg today and tomorrow.  Check basic metabolic panel on Monday.  Have her take 20 mEq of potassium chloride when taking metolazone.

## 2019-10-02 NOTE — Telephone Encounter (Signed)
Returned pt call. She states that she is still having extreme swelling in her feet. She stated that she cannot tie her shoes as they are too tight. Her current weight is around 255 lbs (from last weight at doctor appt.) She is currently taking 60 mg of torsemide in the morning & 20 mg in the afternoon daily. She is elevating her feet when she can. No complaints of SOB, chest pain or fast heart rates.  Please advise.

## 2019-10-02 NOTE — Telephone Encounter (Signed)
Pt made aware, voiced understanding. RX sent in. She will have labs done on Monday.

## 2019-10-02 NOTE — Telephone Encounter (Signed)
Pt called stating that her feet are extremely swollen. They're so bad she can't tie her shoes and they're very painful. Pt states she's still taking the Torsemide   Please call [phone]

## 2019-10-05 ENCOUNTER — Other Ambulatory Visit: Payer: Self-pay | Admitting: Allergy & Immunology

## 2019-10-07 ENCOUNTER — Other Ambulatory Visit: Payer: Self-pay

## 2019-10-07 ENCOUNTER — Telehealth: Payer: Self-pay | Admitting: Cardiovascular Disease

## 2019-10-07 ENCOUNTER — Other Ambulatory Visit (HOSPITAL_COMMUNITY)
Admission: RE | Admit: 2019-10-07 | Discharge: 2019-10-07 | Disposition: A | Discharge status: Home / Self Care | Payer: Medicare Other | Source: Ambulatory Visit | Attending: Cardiovascular Disease | Admitting: Cardiovascular Disease

## 2019-10-07 DIAGNOSIS — R609 Edema, unspecified: Secondary | ICD-10-CM | POA: Diagnosis present

## 2019-10-07 LAB — BASIC METABOLIC PANEL
Anion gap: 15 (ref 5–15)
BUN: 21 mg/dL — ABNORMAL HIGH (ref 6–20)
CO2: 29 mmol/L (ref 22–32)
Calcium: 9.2 mg/dL (ref 8.9–10.3)
Chloride: 96 mmol/L — ABNORMAL LOW (ref 98–111)
Creatinine, Ser: 0.77 mg/dL (ref 0.44–1.00)
GFR calc Af Amer: >60 mL/min (ref >60)
GFR calc non Af Amer: >60 mL/min (ref >60)
Glucose, Bld: 87 mg/dL (ref 70–99)
Potassium: 4 mmol/L (ref 3.5–5.1)
Sodium: 140 mmol/L (ref 135–145)

## 2019-10-07 NOTE — Telephone Encounter (Signed)
Had blood work done this morning. Stated that she still is experiencing issues with her feet swelling and would like to know what can be done about this.

## 2019-10-07 NOTE — Telephone Encounter (Signed)
Pt is having ongoing issues with her feet swelling. She is taking torsemide 60 mg in the morning and 20 mg in the afternoon. She was given metolazone 2.5 mg to take on 12/23 & 12/24. Her current weight is around 255 lbs. (per last doctor visit) She had labs done this morning. She has no complaints of SOB, dizziness, chest pain. She does elevate her feet when she can. Please advise.

## 2019-10-07 NOTE — Telephone Encounter (Signed)
Patient called back checking on status of her previous call.

## 2019-10-08 NOTE — Telephone Encounter (Signed)
Can she see me tomorrow at [facility]

## 2019-10-08 NOTE — Telephone Encounter (Signed)
Patient called checking on status of her call

## 2019-10-08 NOTE — Telephone Encounter (Signed)
Pt will see Dr. Harl Bowie to discus swelling. 10/09/2019 @ 2. Pt made aware.

## 2019-10-09 ENCOUNTER — Other Ambulatory Visit: Payer: Self-pay

## 2019-10-09 ENCOUNTER — Ambulatory Visit (INDEPENDENT_AMBULATORY_CARE_PROVIDER_SITE_OTHER): Payer: Medicare Other | Admitting: Cardiology

## 2019-10-09 ENCOUNTER — Encounter: Payer: Self-pay | Admitting: Cardiology

## 2019-10-09 VITALS — BP 167/79 | HR 71 | Temp 97.7°F | Ht 64.0 in | Wt 251.0 lb

## 2019-10-09 DIAGNOSIS — R609 Edema, unspecified: Secondary | ICD-10-CM

## 2019-10-09 DIAGNOSIS — Z79899 Other long term (current) drug therapy: Secondary | ICD-10-CM | POA: Diagnosis not present

## 2019-10-09 MED ORDER — TORSEMIDE 20 MG PO TABS: ORAL_TABLET | ORAL | 3 refills | Status: DC

## 2019-10-09 NOTE — Progress Notes (Signed)
Clinical Summary Laura [name] is a 58 y.o.female regular patient of Dr Bronson Ing, seen today as an add on patient for recent issues with leg edema   1. LE edema - 08/2019 echo LVEF 60-65%, elevated LVEDP, mild RV dysfunction, mild to mod LAE,  - has been on torsemide 60mg  daily - took metolazone 2.5mg  x 2 days  09/04/19 visit 250 lbs  09/18/19 labs: BNP 204 K 3.9 Cr 0.96  12/15 Cr 1.06  12/28 Cr 0.77  - no better with metolazone.  - home weights are 252 lbs. Was as low as 241 lbs after recent discharge, highest 264 lbs.  - limiting sodium intake.     Past Medical History:  Diagnosis Date  . Asthma   . Atrial fibrillation ([place])   . Bipolar disorder ([place])   . Bronchiectasis ([place])   . CHF (congestive heart failure) ([place])   . Complication of anesthesia    "my Dr in [place] told me I had an allergic reaction to the combination of the pain meds and the anesthesia" she does not remember what happened but "I eneded up in the ICU for 5 days".  . H/O congenital atrial septal defect (ASD) repair   . Hypogammaglobulinemia ([place])   . Hypothyroid   . 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 Neuropathy   . Osteoarthritis   . PTSD (post-traumatic stress disorder)   . Recurrent sinusitis   . Short-term memory loss   . Sleep apnea   . TIA (transient ischemic attack)   . Tremors of nervous system    seeing PCP for this.  . Type 2 diabetes mellitus ([place])   . Urticaria   . Wound infection      Allergies  Allergen Reactions  . Ativan [Lorazepam] Hives  . Phenergan [Promethazine Hcl] Hives     Current Outpatient Medications  Medication Sig Dispense Refill  . acetaminophen (TYLENOL) 325 MG tablet Take 2 tablets (650 mg total) by mouth every 6 (six) hours as needed for mild pain, fever or headache. 30 tablet 1  . apixaban (ELIQUIS) 5 MG TABS tablet Take 5 mg by mouth 2 (two) times daily.    Marland Kitchen atorvastatin (LIPITOR) 10 MG tablet Take 10 mg by mouth every evening.     . diltiazem (CARDIZEM  CD) 300 MG 24 hr capsule Take 1 capsule (300 mg total) by mouth every morning. 90 capsule 3  . [START ON 10/10/2019] divalproex (DEPAKOTE ER) 500 MG 24 hr tablet Take 3 tablets (1,500 mg total) by mouth 2 (two) times daily. 1000 mg in AM, 1500 mg in PM (Patient taking differently: Take 1,000-1,500 mg by mouth See admin instructions. 1000 mg in AM, 1500 mg in PM) 450 tablet 0  . Dulaglutide (TRULICITY) 1.5 0000000 SOPN Inject 1.5 mg into the skin every Sunday.     . gabapentin (NEURONTIN) 600 MG tablet Take 600 mg by mouth 3 (three) times daily.    Marland Kitchen HUMALOG KWIKPEN 100 UNIT/ML KwikPen Inject 7 Units into the skin 3 (three) times daily. Sliding scale.    . hydrALAZINE (APRESOLINE) 25 MG tablet Take 25 mg by mouth 3 (three) times daily.    . hydrOXYzine (ATARAX/VISTARIL) 25 MG tablet Take 1 tablet (25 mg total) by mouth every 6 (six) hours as needed for anxiety or nausea. 30 tablet 0  . Immune Globulin, Human, (CUVITRU) 4 GM/20ML SOLN Inject 100 mLs into the skin every 14 (fourteen) days.    . insulin glargine (LANTUS) 100 UNIT/ML injection  Inject 60 Units into the skin at bedtime.    . lamoTRIgine (LAMICTAL) 25 MG tablet Take 1 tablet (25 mg total) by mouth daily. 90 tablet 0  . LANTUS SOLOSTAR 100 UNIT/ML Solostar Pen SMARTSIG:50 Unit(s) SUB-Q Daily    . levofloxacin (LEVAQUIN) 750 MG tablet Take 1 tablet (750 mg total) by mouth daily. 7 tablet 0  . levothyroxine (SYNTHROID, LEVOTHROID) 112 MCG tablet Take 112 mcg by mouth daily before breakfast.    . Melatonin 3 MG TABS Take 3 mg by mouth at bedtime.    . metFORMIN (GLUCOPHAGE) 500 MG tablet Take 1,000 mg by mouth 2 (two) times daily.    . metFORMIN (GLUCOPHAGE-XR) 500 MG 24 hr tablet Take 1,000 mg by mouth 2 (two) times daily.   0  . metolazone (ZAROXOLYN) 2.5 MG tablet TAKE 2.5 MG ONLY WHEN DIRECTED. 5 tablet 0  . mometasone-formoterol (DULERA) 200-5 MCG/ACT AERO Inhale 2 puffs into the lungs 2 (two) times daily. 13 g 5  . ondansetron (ZOFRAN)  4 MG tablet Take 1 tablet (4 mg total) by mouth every 8 (eight) hours as needed for nausea or vomiting. 20 tablet 0  . potassium chloride SA (KLOR-CON) 20 MEQ tablet TAKE 20 MEQ ON DAYS YOU TAKE METOLAZONE. 30 tablet 0  . torsemide (DEMADEX) 20 MG tablet Take 60 mg by mouth daily.    . VENTOLIN HFA 108 (90 Base) MCG/ACT inhaler INHALE 2 PUFFS INTO THE LUNGS EVERY 6 HOURS AS NEEDED FOR WHEEZING OR SHORTNESS OF BREATH 18 g 1   No current facility-administered medications for this visit.     Past Surgical History:  Procedure Laterality Date  . 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 CARDIAC SURGERY    . CARDIOVERSION    . CARDIOVERSION N/A 08/29/2018   Procedure: CARDIOVERSION;  Surgeon: Arnoldo Lenis, MD;  Location: [facility];  Service: Endoscopy;  Laterality: N/A;  . [place]  . LEFT HEART CATH AND CORONARY ANGIOGRAPHY N/A 08/30/2019   Procedure: LEFT HEART CATH AND CORONARY ANGIOGRAPHY;  Surgeon: [name], MD;  Location: [facility];  Service: Cardiovascular;  Laterality: N/A;  . NASAL SINUS SURGERY  2017     Allergies  Allergen Reactions  . Ativan [Lorazepam] Hives  . Phenergan [Promethazine Hcl] Hives      Family History  Problem Relation Age of Onset  . Hypertension Mother   . Stroke Mother   . Kidney disease Mother   . Heart attack Mother   . Alcohol abuse Mother   . Kidney disease Sister   . Heart disease Brother   . Stroke Maternal Aunt   . Heart disease Maternal Aunt   . Hypertension Sister   . Bipolar disorder Sister   . Thyroid disease Sister   . Thyroid disease Daughter   . Thyroid disease Daughter   . Hashimoto's thyroiditis Daughter   . Celiac disease Daughter   . Allergic rhinitis Daughter   . Thyroid disease Daughter   . Asthma Neg Hx      Social History Laura [name] reports that she quit smoking about 13 years ago. Her smoking use included cigarettes. She started smoking about 41 years ago. She has a 28.00  pack-year smoking history. She has never used smokeless tobacco. Laura [name] reports previous alcohol use.   Review of Systems CONSTITUTIONAL: No weight loss, fever, chills, weakness or fatigue.  HEENT: Eyes: No visual loss, blurred vision, double vision or yellow sclerae.No hearing loss,  sneezing, congestion, runny nose or sore throat.  SKIN: No rash or itching.  CARDIOVASCULAR: per hpi RESPIRATORY: No shortness of breath, cough or sputum.  GASTROINTESTINAL: No anorexia, nausea, vomiting or diarrhea. No abdominal pain or blood.  GENITOURINARY: No burning on urination, no polyuria NEUROLOGICAL: No headache, dizziness, syncope, paralysis, ataxia, numbness or tingling in the extremities. No change in bowel or bladder control.  MUSCULOSKELETAL: No muscle, back pain, joint pain or stiffness.  LYMPHATICS: No enlarged nodes. No history of splenectomy.  PSYCHIATRIC: No history of depression or anxiety.  ENDOCRINOLOGIC: No reports of sweating, cold or heat intolerance. No polyuria or polydipsia.  Marland Kitchen   Physical Examination Today's Vitals   10/09/19 1347  BP: (!) 167/79  Pulse: 71  Temp: 97.7 F (36.5 C)  TempSrc: Temporal  SpO2: 94%  Weight: 251 lb (113.9 kg)  Height: 5\' 4"  (1.626 m)   Body mass index is 43.08 kg/m.  Gen: resting comfortably, no acute distress HEENT: no scleral icterus, pupils equal round and reactive, no palptable cervical adenopathy,  CV: RRR, no m/r/g, no jvd Resp: Clear to auscultation bilaterally GI: abdomen is soft, non-tender, non-distended, normal bowel sounds, no hepatosplenomegaly MSK: extremities are warm, no edema.  Skin: warm, no rash Neuro:  no focal deficits Psych: appropriate affect     Assessment and Plan   1. LE edema - we will increase her torsemide to 60mg  in AM and 40mg  in afternoon - would hold on any further metolazone, would look to optimzie loop diuretic dosing before repeating metolazone.  - repeat BMET/Mg    Arnoldo Lenis, M.D.

## 2019-10-09 NOTE — Patient Instructions (Signed)
Medication Instructions:  Stop metolazone  Increase torsemide to 60 mg in the morning & 40 mg in the afternoon  Labwork: 1 week   bmet Magnesium   Testing/Procedures: none  Follow-Up: Your physician recommends that you schedule a follow-up appointment in: Feb 2021    Any Other Special Instructions Will Be Listed Below (If Applicable). YOU HAVE BEEN GIVEN A PRESCRIPTION FOR COMPRESSION STOCKINGS  If you need a refill on your cardiac medications before your next appointment, please call your pharmacy.

## 2019-10-10 ENCOUNTER — Other Ambulatory Visit: Payer: Self-pay

## 2019-10-10 NOTE — Telephone Encounter (Signed)
Refill request, metolazone stopped yesterday, will fax to pharmacy

## 2019-10-14 ENCOUNTER — Other Ambulatory Visit (HOSPITAL_COMMUNITY): Payer: Self-pay | Admitting: Psychiatry

## 2019-10-14 MED ORDER — LAMOTRIGINE 25 MG PO TABS: 25.0000 mg | ORAL_TABLET | Freq: Every day | ORAL | 0 refills | Status: DC

## 2019-10-14 NOTE — Progress Notes (Signed)
Called patient and left voicemail for her call the office back with an update on how she is doing.

## 2019-10-14 NOTE — Progress Notes (Signed)
Thanks for reaching out to her!   Salvatore Marvel, MD

## 2019-10-14 NOTE — Progress Notes (Signed)
Patient called back and stated that she ended up having to go see her cardiologist due to her swelling so bad, especially her feet. The cardiologist increased Torsemide to 20mg - three tablets in the morning and two in the afternoon. Patient also started wearing compression socks and states that she is finally starting to feel better. Patient has a follow up appointment coming up soon.

## 2019-10-18 ENCOUNTER — Other Ambulatory Visit: Payer: Self-pay

## 2019-10-18 ENCOUNTER — Ambulatory Visit (HOSPITAL_COMMUNITY): Payer: Medicare Other | Admitting: Clinical

## 2019-10-24 ENCOUNTER — Ambulatory Visit (HOSPITAL_COMMUNITY): Payer: Medicare Other | Admitting: Clinical

## 2019-10-25 ENCOUNTER — Other Ambulatory Visit (HOSPITAL_COMMUNITY)
Admission: RE | Admit: 2019-10-25 | Discharge: 2019-10-25 | Disposition: A | Discharge status: Home / Self Care | Payer: Medicare Other | Source: Ambulatory Visit | Attending: Cardiology | Admitting: Cardiology

## 2019-10-25 ENCOUNTER — Other Ambulatory Visit: Payer: Self-pay

## 2019-10-25 ENCOUNTER — Ambulatory Visit (INDEPENDENT_AMBULATORY_CARE_PROVIDER_SITE_OTHER): Payer: Medicare Other | Admitting: Allergy & Immunology

## 2019-10-25 ENCOUNTER — Other Ambulatory Visit (HOSPITAL_COMMUNITY)
Admission: RE | Admit: 2019-10-25 | Discharge: 2019-10-25 | Disposition: A | Discharge status: Home / Self Care | Payer: Medicare Other | Source: Ambulatory Visit | Attending: Allergy & Immunology | Admitting: Allergy & Immunology

## 2019-10-25 ENCOUNTER — Encounter: Payer: Self-pay | Admitting: Allergy & Immunology

## 2019-10-25 VITALS — BP 146/76 | HR 86 | Temp 98.4°F | Resp 16 | Ht 64.0 in

## 2019-10-25 DIAGNOSIS — R609 Edema, unspecified: Secondary | ICD-10-CM | POA: Insufficient documentation

## 2019-10-25 DIAGNOSIS — D839 Common variable immunodeficiency, unspecified: Secondary | ICD-10-CM

## 2019-10-25 DIAGNOSIS — Z79899 Other long term (current) drug therapy: Secondary | ICD-10-CM | POA: Insufficient documentation

## 2019-10-25 DIAGNOSIS — J4550 Severe persistent asthma, uncomplicated: Secondary | ICD-10-CM | POA: Diagnosis not present

## 2019-10-25 LAB — BASIC METABOLIC PANEL
Anion gap: 13 (ref 5–15)
BUN: 18 mg/dL (ref 6–20)
CO2: 27 mmol/L (ref 22–32)
Calcium: 9.4 mg/dL (ref 8.9–10.3)
Chloride: 103 mmol/L (ref 98–111)
Creatinine, Ser: 0.83 mg/dL (ref 0.44–1.00)
GFR calc Af Amer: >60 mL/min (ref >60)
GFR calc non Af Amer: >60 mL/min (ref >60)
Glucose, Bld: 136 mg/dL — ABNORMAL HIGH (ref 70–99)
Potassium: 3.6 mmol/L (ref 3.5–5.1)
Sodium: 143 mmol/L (ref 135–145)

## 2019-10-25 LAB — MAGNESIUM: Magnesium: 1.5 mg/dL — ABNORMAL LOW (ref 1.7–2.4)

## 2019-10-25 MED ORDER — SPIRIVA RESPIMAT 1.25 MCG/ACT IN AERS: 2.0000 | INHALATION_SPRAY | Freq: Every day | RESPIRATORY_TRACT | 5 refills | Status: DC

## 2019-10-25 MED ORDER — MOMETASONE FURO-FORMOTEROL FUM 200-5 MCG/ACT IN AERO: 2.0000 | INHALATION_SPRAY | Freq: Two times a day (BID) | RESPIRATORY_TRACT | 5 refills | Status: DC

## 2019-10-25 NOTE — Progress Notes (Signed)
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 Bronson Ing  Essential tremor  Congestive heart failure- sees Dr. Bronson Ing   Plan/Recommendations:   1. Hypogammaglobulinemia - Continue with Cuvitru at the same dose. - Do not get behind on the infusions.  - Do consider getting the vaccine in the future (go for one of the mRNA vaccines such as Dance movement psychotherapist).  - IgG subclasses ordered today to make sure that your levels are adequate.   2. Chronic rhinitis   - Continue with nasal saline rinses as needed.   3. Severe persistent asthma, uncomplicated - Lung function stable today. - We will restart Berna Bue (make an appointment in two weeks for this injection).  - Daily controller medication(s): Dulera 200/72mcg two puffs twice daily with spacer and Spiriva 1.65mcg two puffs once daily - Prior to physical activity: ProAir 2 puffs 10-15 minutes before physical activity. - Rescue medications: ProAir 4 puffs every 4-6 hours as needed, albuterol nebulizer one vial every 4-6 hours as needed or DuoNeb nebulizer one vial every 4-6 hours as needed - Asthma control goals:  * Full participation in all desired activities (may need albuterol before activity) * Albuterol use two time or less a week on average (not counting use with activity) * Cough interfering with sleep two time or less a month * Oral steroids no more than once a year * No hospitalizations.  4. Return in about 4 months (around  02/22/2020). This can be an in-person, a virtual Webex or a telephone follow up visit.   Subjective:   Laura [name] is a 59 y.o. female presenting today for follow up of  Chief Complaint  Patient presents with  . Asthma    Kemarie [name] has a history of the following: Patient Active Problem List   Diagnosis Date Noted  . Severe persistent asthma with acute exacerbation 10/01/2019  . Lower extremity edema 10/01/2019  . Abnormal EKG   . Acute on chronic diastolic CHF (congestive heart failure) ([place]) 08/29/2019  . Shortness of breath 08/29/2019  . Chest pain 08/28/2019  . Atypical chest pain 08/10/2018  . Bipolar disorder ([place]) 08/10/2018  . Headache 08/10/2018  . H/O atrial flutter 08/10/2018  . OSA on CPAP 08/10/2018  . Anxiety 08/10/2018  . Uncontrolled type 2 diabetes mellitus with hyperglycemia ([place]) 03/13/2018  . Hypothyroidism 03/13/2018  . Essential hypertension, benign 03/13/2018  . Mixed hyperlipidemia 03/13/2018  . Hypogammaglobulinemia ([place]) 02/06/2018  . Non-allergic rhinitis 02/06/2018  . Pulmonary nodules 02/06/2018  . Bronchiectasis without complication ([place]) [id number]  . DM type 2 causing vascular disease ([place]) 02/06/2018    PCP: Dr. Meade Maw Cardiologist: Dr. Kate Sable Endocrinologist: Dr. Loni Beckwith Pulmonologist: Dr. Rayann Heman  Orthopedic Surgeon: Dr. Almedia Balls   History obtained from: chart review and patient.  Laura is a 59 y.o. female presenting for a follow up visit. She was last seen in December 2020. At that time, she was experiencing a prolonged episode of shortness of breath as well as LE edema. I felt that this was secondary to her CHF and her CXR had been completely  normal. I did give her Levaquin out of an abundance of caution and recommended that she continue to follow up with her cardiologist.   In the interim, she did have her torsemide increased which seemed to have improved her symptoms. She did start wearing  compression stockings which also helped.This was all related to the her congestive heart failure. She is doing better without waking up with issues. She is still waking up with three pillows. She is now stabilized. She is doing much better today and has been able to stay out of the hospital.    She go to [place] last week and went there for five days. She has had a good time with her daughter. Despite the travels, she tells me that she has been remaining safe during the pandemic. She is not going out to eat at  All she is not working out at [employer] as she was prior to the pandemic. She has not been going to the [place] parties, either.   She is now seeing Dr. Loanne Drilling with Pulmonology instead of [name], who is now working full time at [facility]. She last saw her last month when she was having the SOB episodes. She was placed on the prednisone and got her a little more stabilized. She was placed on more Levaquin by her Cardiologist. She was on it for two courses in total, but she is fine now. She does not remember the last time that she needed antibiotics prior to this prolonged episodes at the end of 2020.    She remains on the [facility] two puffs BID. She is also on the Spiriva. Since being out of the Spiriva, she feels worsening SOB. She definitely thinks that the Spiriva helps her breathing. We did spend some time discussing her biologic Berna Bue) again and she is open to trying it again. Earlier in 2020, she was having increased SOB episodes  and [name] reached out to me to see I agreed with starting a biologic. I was fine with this and we got it approved on a steroid sparing indication. She did take a couple of doses, but then felt that it was not working. She also did not want to make the trip to [facility] during the months when we were based there temporarily. However, in retrospect she feels that she did better when she was on it. She would like to go ahead and start it again.    Her last chest CT was in March 2020. At that time, it actually looked fairly good without evidence of interstitial lung disease and nonspecific diffuse bronchial wall thickening consistent with reactive airway disease.   Infusions are going well. She has kept up on them despite everything that was going on. She had had no problems with it whatsoever. She remains on Cuvitru 20gm every two weeks. She feels confident with doing the infusions herself.   Otherwise, there have been no changes to her past medical history, surgical history, family history, or social history.    Review of Systems  Constitutional: Negative.  Negative for chills, fever, malaise/fatigue and weight loss.  HENT: Negative.  Negative for congestion, ear discharge, ear pain, sinus pain and sore throat.   Eyes: Negative for pain, discharge and redness.  Respiratory: Positive for cough and shortness of breath. Negative for sputum production and wheezing.   Cardiovascular: Positive for leg swelling. Negative for chest pain, palpitations and claudication.  Gastrointestinal: Negative for abdominal pain, constipation, diarrhea, heartburn, nausea and  vomiting.  Skin: Negative.  Negative for itching and rash.  Neurological: Positive for tingling and headaches. Negative for dizziness.  Endo/Heme/Allergies: Negative for environmental allergies. Does not bruise/bleed easily.       Objective:   Blood pressure (!) 146/76, pulse 86, temperature 98.4 F (36.9 C), temperature source Temporal, resp. rate 16, height 5\' 4"  (1.626 m), SpO2 96 %. Body mass index is 43.08 kg/m.   Physical Exam:  Physical Exam  Constitutional: She appears well-developed.  Talkative and seems to be breathing more comfortably than previous visits.  HENT:  Head: Normocephalic and atraumatic.  Right Ear: Tympanic membrane, external ear and ear canal normal.  Left Ear: Tympanic membrane, external ear and ear canal normal.  Nose: Mucosal edema present  without dischrag. No nasal deformity or septal deviation. No epistaxis. Right sinus exhibits no maxillary sinus tenderness and no frontal sinus tenderness. Left sinus exhibits no maxillary sinus tenderness and no frontal sinus tenderness.  Mouth/Throat: Uvula is midline and oropharynx is clear and moist. Mucous membranes are not pale and not dry.  Eyes: Pupils are equal, round, and reactive to light. Conjunctivae and EOM are normal. Right eye exhibits no chemosis and no discharge. Left eye exhibits no chemosis and no discharge. Right conjunctiva is not injected. Left conjunctiva is not injected.  Cardiovascular: Normal rate, regular rhythm and normal heart sounds. She continues to have some pedal edema, but it is better than her last visit.  Respiratory: Effort normal and breath sounds normal. No accessory muscle usage. No tachypnea. No respiratory distress. She has no wheezes. She has no rhonchi. She has no rales. She exhibits no tenderness.  Decreased air movement at the bases. No increased work of breathing noted. No wheezing or crackles noted.   Lymphadenopathy:    She has no cervical adenopathy.  Neurological: She is alert.  Skin: No abrasion, no petechiae and no rash noted. Rash is not papular, not vesicular and not urticarial. No erythema. No pallor.  Psychiatric: She has a normal mood and affect.      Diagnostic studies:    Spirometry: results abnormal (FEV1: 1.52/65%, FVC: 2.04/65%, FEV1/FVC: 74%).    Spirometry consistent with possible restrictive disease.   Allergy Studies: none      Salvatore Marvel, MD  All[facility] [place]

## 2019-10-25 NOTE — Patient Instructions (Addendum)
1. Hypogammaglobulinemia - Continue with Cuvitru at the same dose. - Do not get behind on the infusions.  - Do consider getting the vaccine in the future (go for one of the mRNA vaccines such as Dance movement psychotherapist).  - IgG subclasses ordered today to make sure that your levels are adequate.   2. Chronic rhinitis   - Continue with nasal saline rinses as needed.   3. Severe persistent asthma, uncomplicated - Lung function stable today. - We will restart Berna Bue (make an appointment in two weeks for this injection).  - Daily controller medication(s): Dulera 200/15mcg two puffs twice daily with spacer and Spiriva 1.59mcg two puffs once daily - Prior to physical activity: ProAir 2 puffs 10-15 minutes before physical activity. - Rescue medications: ProAir 4 puffs every 4-6 hours as needed, albuterol nebulizer one vial every 4-6 hours as needed or DuoNeb nebulizer one vial every 4-6 hours as needed - Asthma control goals:  * Full participation in all desired activities (may need albuterol before activity) * Albuterol use two time or less a week on average (not counting use with activity) * Cough interfering with sleep two time or less a month * Oral steroids no more than once a year * No hospitalizations.  4. Return in about 4 months (around 02/22/2020). This can be an in-person, a virtual Webex or a telephone follow up visit.   Please inform us of any Emergency Department visits, hospitalizations, or changes in symptoms. Call us before going to the ED for breathing or allergy symptoms since we might be able to fit you in for a sick visit. Feel free to contact us anytime with any questions, problems, or concerns.  It was a pleasure to see you again today!  Websites that have reliable patient information: 1. American Academy of Asthma, Allergy, and Immunology: www.aaaai.org 2. Food Allergy Research and Education (FARE): foodallergy.org 3. Mothers of Asthmatics:  http://www.asthmacommunitynetwork.org 4. American College of Allergy, Asthma, and Immunology: www.acaai.org   COVID-19 Vaccine Information can be found at: ShippingScam.co.uk For questions related to vaccine distribution or appointments, please email [email or web address] or call [phone].     "Like" [place] on Facebook and Instagram for our latest updates!        Make sure you are registered to vote! If you have moved or changed any of your contact information, you will need to get this updated before voting!  In some cases, you MAY be able to register to vote online: [email or web address]

## 2019-10-26 ENCOUNTER — Encounter: Payer: Self-pay | Admitting: Allergy & Immunology

## 2019-10-26 LAB — IGG 1, 2, 3, AND 4
IgG (Immunoglobin G), Serum: 804 mg/dL (ref 586–1602)
IgG, Subclass 1: 513 mg/dL (ref 248–810)
IgG, Subclass 2: 209 mg/dL (ref 130–555)
IgG, Subclass 3: 41 mg/dL (ref 15–102)
IgG, Subclass 4: 13 mg/dL (ref 2–96)

## 2019-10-29 ENCOUNTER — Other Ambulatory Visit: Payer: Self-pay

## 2019-10-29 DIAGNOSIS — M170 Bilateral primary osteoarthritis of knee: Secondary | ICD-10-CM | POA: Diagnosis not present

## 2019-10-29 MED ORDER — POTASSIUM CHLORIDE CRYS ER 20 MEQ PO TBCR: EXTENDED_RELEASE_TABLET | ORAL | 6 refills | Status: DC

## 2019-10-29 NOTE — Telephone Encounter (Signed)
Refilled potassium per fax request 

## 2019-10-30 ENCOUNTER — Encounter: Payer: Self-pay | Admitting: Pulmonary Disease

## 2019-10-30 ENCOUNTER — Ambulatory Visit (INDEPENDENT_AMBULATORY_CARE_PROVIDER_SITE_OTHER): Payer: Medicare Other | Admitting: Pulmonary Disease

## 2019-10-30 ENCOUNTER — Other Ambulatory Visit: Payer: Self-pay

## 2019-10-30 ENCOUNTER — Telehealth: Payer: Self-pay | Admitting: Cardiovascular Disease

## 2019-10-30 VITALS — BP 142/68 | HR 76 | Temp 98.2°F | Ht 64.0 in | Wt 253.2 lb

## 2019-10-30 DIAGNOSIS — R911 Solitary pulmonary nodule: Secondary | ICD-10-CM

## 2019-10-30 DIAGNOSIS — R918 Other nonspecific abnormal finding of lung field: Secondary | ICD-10-CM | POA: Diagnosis not present

## 2019-10-30 DIAGNOSIS — J4550 Severe persistent asthma, uncomplicated: Secondary | ICD-10-CM | POA: Diagnosis not present

## 2019-10-30 NOTE — Telephone Encounter (Signed)
Returned pt call. She needed clarification on metolazone & potassium prescription. Informed her that it was not changed, she does not need it at this time. She voiced understanding and will not take.

## 2019-10-30 NOTE — Telephone Encounter (Signed)
Please give pt a call concerning medication and lab results

## 2019-10-30 NOTE — Patient Instructions (Addendum)
Severe Persistent Asthma with exacerbation CONTINUE Dulera 200-5 mcg TWO puffs TWICE a day CONTINUE Spiriva 1.25 mcg TWO puffs ONCE a day START Fasenra when available CONTINUE Albuterol for shortness of breath or wheezing AS NEEDED  RLL lung nodule ~6-54mm Will schedule CT Chest in June 2021.   If you are interested in receiving your Covid-19 vaccine. You can visit [email or web address] and sign up through [facility].  Or if you want to schedule through [facility] Department you can visit Online at [email or web address] or By phone at [phone] (Option 2) from 8:00 am-5:00 pm, until all appointment slots have been filled.

## 2019-10-30 NOTE — Progress Notes (Signed)
Subjective:   PATIENT ID: Laura [name] GENDER: female DOB: 1961-06-07, MRN: [id number]  Synopsis: Former patient of 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   HPI  Chief Complaint  Patient presents with  . Follow-up    fasenra has not started yet, still alittle shortness of breath , dulera and albuterol     Reason for Visit: Follow-up  Ms. Tahli Mathson is a 59 year old female with bronchiectasis, CVID, OSA who presents for follow-up.  Since our last visit, she reports overall feels improved. She is able to do daily walks to the mailbox with an uphill climb when this was previously a difficult task for her. She is compliant with her Dulera. She is starting Spiriva which she plans to pick up today. She is enrolling in [place] with Dr. Ernst Bowler. Previously on it 6 months ago for only a few months without improvement but she is willing to try if for a more extended period of time. She is still needing albuterol inhaler daily. She is also increased dose of torsemide with improved lower extremity edema.  Reviewed progress note from 10/25/19 from Allergy with Dr. Ernst Bowler - Started Spiriva in addition to [facility]. Remains on Cuvitru q2weeks.  Reviewed progress note from 10/09/19 from Cardiology with Dr. Harl Bowie - On increased torsemide dose of 60 mg in the am and 40 in pm for heart failure regimen  Social History: Former smoker. 28 pack years. Quit in 2008.  I have personally reviewed patient's past medical/family/social history/allergies/current medications.  Past Medical History:  Diagnosis Date  . Asthma   . Atrial fibrillation ([place])   . Bipolar disorder ([place])   . Bronchiectasis ([place])   . CHF (congestive heart failure) ([place])   . Complication of anesthesia    "my Dr in [place] told me I had an allergic reaction to the combination of the pain meds and the anesthesia" she does not remember what happened but "I eneded up in the ICU for  5 days".  . H/O congenital atrial septal defect (ASD) repair   . Hypogammaglobulinemia ([place])   . Hypothyroid   . 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 Neuropathy   . Osteoarthritis   . PTSD (post-traumatic stress disorder)   . Recurrent sinusitis   . Short-term memory loss   . Sleep apnea   . TIA (transient ischemic attack)   . Tremors of nervous system    seeing PCP for this.  . Type 2 diabetes mellitus ([place])   . Urticaria   . Wound infection      Allergies  Allergen Reactions  . Ativan [Lorazepam] Hives  . Phenergan [Promethazine Hcl] Hives     Outpatient Medications Prior to Visit  Medication Sig Dispense Refill  . acetaminophen (TYLENOL) 325 MG tablet Take 2 tablets (650 mg total) by mouth every 6 (six) hours as needed for mild pain, fever or headache. 30 tablet 1  . apixaban (ELIQUIS) 5 MG TABS tablet Take 5 mg by mouth 2 (two) times daily.    Marland Kitchen atorvastatin (LIPITOR) 10 MG tablet Take 10 mg by mouth every evening.     . diltiazem (CARDIZEM CD) 300 MG 24 hr capsule Take 1 capsule (300 mg total) by mouth every morning. 90 capsule 3  . divalproex (DEPAKOTE ER) 500 MG 24 hr tablet Take 3 tablets (1,500 mg total) by mouth 2 (two) times daily. 1000 mg in AM, 1500 mg in PM (Patient taking differently: Take 1,000-1,500 mg  by mouth See admin instructions. 1000 mg in AM, 1500 mg in PM) 450 tablet 0  . Dulaglutide (TRULICITY) 1.5 0000000 SOPN Inject 1.5 mg into the skin every Sunday.     . gabapentin (NEURONTIN) 600 MG tablet Take 600 mg by mouth 3 (three) times daily.    Marland Kitchen HUMALOG KWIKPEN 100 UNIT/ML KwikPen Inject 7 Units into the skin 3 (three) times daily. Sliding scale.    . hydrALAZINE (APRESOLINE) 25 MG tablet Take 25 mg by mouth 3 (three) times daily.    . hydrOXYzine (ATARAX/VISTARIL) 25 MG tablet Take 1 tablet (25 mg total) by mouth every 6 (six) hours as needed for anxiety or nausea. 30 tablet 0  . Immune Globulin, Human, (CUVITRU) 4 GM/20ML SOLN Inject 100 mLs into the skin every 14  (fourteen) days.    . insulin glargine (LANTUS) 100 UNIT/ML injection Inject 60 Units into the skin at bedtime.    . lamoTRIgine (LAMICTAL) 25 MG tablet Take 1 tablet (25 mg total) by mouth daily. 90 tablet 0  . LANTUS SOLOSTAR 100 UNIT/ML Solostar Pen SMARTSIG:50 Unit(s) SUB-Q Daily    . levofloxacin (LEVAQUIN) 750 MG tablet Take 1 tablet (750 mg total) by mouth daily. 7 tablet 0  . levothyroxine (SYNTHROID, LEVOTHROID) 112 MCG tablet Take 112 mcg by mouth daily before breakfast.    . Melatonin 3 MG TABS Take 3 mg by mouth at bedtime.    . metFORMIN (GLUCOPHAGE) 500 MG tablet Take 1,000 mg by mouth 2 (two) times daily.    . metFORMIN (GLUCOPHAGE-XR) 500 MG 24 hr tablet Take 1,000 mg by mouth 2 (two) times daily.   0  . mometasone-formoterol (DULERA) 200-5 MCG/ACT AERO Inhale 2 puffs into the lungs 2 (two) times daily. 13 g 5  . ondansetron (ZOFRAN) 4 MG tablet Take 1 tablet (4 mg total) by mouth every 8 (eight) hours as needed for nausea or vomiting. 20 tablet 0  . ONETOUCH ULTRA test strip USE TO CHECK BLOOD SUGAR FOUR TIMES DAILY    . potassium chloride SA (KLOR-CON) 20 MEQ tablet TAKE 20 MEQ ON DAYS YOU TAKE METOLAZONE. 30 tablet 6  . Tiotropium Bromide Monohydrate (SPIRIVA RESPIMAT) 1.25 MCG/ACT AERS Inhale 2 puffs into the lungs daily. 4 g 5  . torsemide (DEMADEX) 20 MG tablet Take 60 mg in the morning. Take 40 mg in the afternoon. 450 tablet 3  . VENTOLIN HFA 108 (90 Base) MCG/ACT inhaler INHALE 2 PUFFS INTO THE LUNGS EVERY 6 HOURS AS NEEDED FOR WHEEZING OR SHORTNESS OF BREATH 18 g 1   No facility-administered medications prior to visit.    Review of Systems  Constitutional: Negative for chills, diaphoresis, fever, malaise/fatigue and weight loss.  HENT: Negative for congestion.   Respiratory: Positive for shortness of breath. Negative for cough, hemoptysis, sputum production and wheezing.   Cardiovascular: Negative for chest pain, palpitations and leg swelling.     Objective:    Vitals:   10/30/19 1002  BP: (!) 142/68  Pulse: 76  Temp: 98.2 F (36.8 C)  TempSrc: Temporal  SpO2: 95%  Weight: 253 lb 3.2 oz (114.9 kg)  Height: 5\' 4"  (1.626 m)   SpO2: 95 % O2 Device: None (Room air)  Physical Exam: General: Well-appearing, no acute distress HENT: [place], AT Eyes: EOMI, no scleral icterus Respiratory: Clear to auscultation bilaterally.  No crackles, wheezing or rales Cardiovascular: RRR, -M/R/G, no JVD Extremities:-Edema,-tenderness Neuro: AAO x4, CNII-XII grossly intact Skin: Intact, no rashes or bruising Psych: Normal mood, normal  affect  Data Reviewed:  Imaging: CTA 09/18/19 - No pulmonary embolism. Nodule ~36mm in right lung base. Minimal right pleural effusion.  PFT: 06/21/19 Spirometry FVC 2.2 (70%) FEV1 1.5 (64%) Ratio 68  Interpretation: Moderate obstructive lung defect is present  Imaging, labs and tests noted above have been reviewed independently by me.    Assessment & Plan:   Discussion: 59 year old female with bronchiectasis, CVID on immunoglobulin therapy, asthma and OSA who presents for follow-up. Her symptoms are better controlled on LABA/ICS however she still has daily symptoms requiring albuterol daily for control. ACT 13. Not in active exacerbation but will need to continue to optimize her inhaler regimen for her asthma. Bronchiectasis and heart failure are well-controlled. We also spent time discussion and re-reviewing her CT scan with 6-7 pulmonary nodule in the RLL. We discussed 6-12 follow-up and patient in favor of earlier imaging due to concern as her husband passed from cancer.  Severe Persistent Asthma, remains symptomatic, not in active exacerbation CONTINUE Dulera 200-5 mcg TWO puffs TWICE a day START Spiriva 1.25 mcg TWO puffs ONCE a day START Fasenra when available CONTINUE Albuterol for shortness of breath or wheezing AS NEEDED  Bronchiectasis CONTINUE flutter valve  Heart failure CONTINUE torsemide as instructed  per Cardiology  RLL lung nodule ~6-15mm - Husband at history of lung cancer Will schedule CT Chest in June 2021.   Health Maintenance Immunization History  Administered Date(s) Administered  . Influenza Split 08/24/2018  . Influenza Whole 07/15/2019  . Influenza,inj,Quad PF,6+ Mos 07/10/2017  . Pneumococcal-Unspecified 10/10/2012   CT Lung Screen - not indicated  Orders Placed This Encounter  Procedures  . CT Chest Wo Contrast    Please schedule patient for June 2021    Standing Status:   Future    Standing Expiration Date:   12/27/2020    Order Specific Question:   Is patient pregnant?    Answer:   No    Order Specific Question:   Preferred imaging location?    Answer:   [facility]    Order Specific Question:   Radiology Contrast Protocol - do NOT remove file path    Answer:   [email or web address]   No orders of the defined types were placed in this encounter.   Return in about 3 months (around 01/28/2020).  I have spent a total time of 32-minutes on the day of the appointment reviewing prior documentation, coordinating care and discussing medical diagnosis and plan with the patient/family. Imaging, labs and tests included in this note have been reviewed and interpreted independently by me.  [place], MD [facility] 10/30/2019 8:03 PM  Office Number [phone]

## 2019-11-01 ENCOUNTER — Ambulatory Visit (INDEPENDENT_AMBULATORY_CARE_PROVIDER_SITE_OTHER): Payer: Medicare Other | Admitting: Clinical

## 2019-11-01 ENCOUNTER — Other Ambulatory Visit: Payer: Self-pay

## 2019-11-01 DIAGNOSIS — F4310 Post-traumatic stress disorder, unspecified: Secondary | ICD-10-CM
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
## 2019-11-04 ENCOUNTER — Telehealth: Payer: Self-pay

## 2019-11-04 DIAGNOSIS — M170 Bilateral primary osteoarthritis of knee: Secondary | ICD-10-CM | POA: Diagnosis not present

## 2019-11-04 MED ORDER — MAGNESIUM OXIDE 400 MG PO CAPS: 400.0000 mg | ORAL_CAPSULE | Freq: Every day | ORAL | 3 refills | Status: DC

## 2019-11-04 NOTE — Telephone Encounter (Signed)
Pt made aware. Voiced understanding,

## 2019-11-08 ENCOUNTER — Ambulatory Visit: Payer: Medicare Other

## 2019-11-08 ENCOUNTER — Ambulatory Visit (INDEPENDENT_AMBULATORY_CARE_PROVIDER_SITE_OTHER): Payer: Medicare Other | Admitting: Allergy & Immunology

## 2019-11-08 ENCOUNTER — Other Ambulatory Visit: Payer: Self-pay

## 2019-11-08 ENCOUNTER — Encounter: Payer: Self-pay | Admitting: Allergy & Immunology

## 2019-11-08 VITALS — BP 132/84 | HR 76 | Temp 97.8°F | Resp 20

## 2019-11-08 DIAGNOSIS — J4550 Severe persistent asthma, uncomplicated: Secondary | ICD-10-CM

## 2019-11-08 DIAGNOSIS — J310 Chronic rhinitis: Secondary | ICD-10-CM

## 2019-11-08 DIAGNOSIS — D839 Common variable immunodeficiency, unspecified: Secondary | ICD-10-CM

## 2019-11-08 DIAGNOSIS — R918 Other nonspecific abnormal finding of lung field: Secondary | ICD-10-CM

## 2019-11-08 NOTE — Progress Notes (Signed)
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 Bronson Ing  Tremor - thought to be related to a medication side effect  Congestive heart failure- seesDr. Bronson Ing   Plan/Recommendations:    1. Hypogammaglobulinemia - Continue with Cuvitru at the same dose. - Do consider getting the vaccine in the future (go for one of the mRNA vaccines such as Dance movement psychotherapist).  - IgG subclasses looked excellent.   2. Chronic rhinitis   - Continue with nasal saline rinses as needed.   3. Severe persistent asthma, uncomplicated - Lung function looks even better than when we saw your earlier this month. - Pulse ox was 97% today, which is excellent.   - We will continue to work on getting your Berna Bue approved again.  -Purchase a pulse oximeter for your finger, which will definitely be more accurate than the Apple Watch.  - Daily controller medication(s): Dulera 200/37mcg two puffs twice daily with spacer and Spiriva 1.36mcg two puffs once daily - Prior to physical activity: ProAir 2 puffs 10-15 minutes before physical activity. - Rescue medications: ProAir 4 puffs every 4-6 hours as needed, albuterol nebulizer one vial every 4-6 hours as needed or DuoNeb nebulizer one vial every 4-6 hours as needed - Asthma control goals:  * Full participation in all desired activities (may need albuterol before activity) * Albuterol use two time or less a week on average (not counting use with activity) * Cough  interfering with sleep two time or less a month * Oral steroids no more than once a year * No hospitalizations.  4. Return in about 3 months (around 02/06/2020). This can be an in-person, a virtual Webex or a telephone follow up visit.   Subjective:   Laura [name] is a 59 y.o. female presenting today for follow up of  Chief Complaint  Patient presents with  . Asthma    SOB all of the time.     Laura [name] has a history of the following: Patient Active Problem List   Diagnosis Date Noted  . Abnormal EKG   . Chronic diastolic (congestive) heart failure ([facility]) 08/29/2019  . Shortness of breath 08/29/2019  . Bipolar disorder ([place]) 08/10/2018  . Headache 08/10/2018  . H/O atrial flutter 08/10/2018  . OSA on CPAP 08/10/2018  . Anxiety 08/10/2018  . Uncontrolled type 2 diabetes mellitus with hyperglycemia ([place]) 03/13/2018  . Hypothyroidism 03/13/2018  . Essential hypertension, benign 03/13/2018  . Mixed hyperlipidemia 03/13/2018  . 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 Non-allergic rhinitis 02/06/2018  . Severe persistent asthma, uncomplicated [id number]  . Pulmonary nodules 02/06/2018  . Bronchiectasis without complication ([place]) [id number]  . DM type 2 causing vascular disease ([place]) 02/06/2018    History obtained from: chart review and patient.  Laura [name] is a 59 y.o. female presenting for a sick visit.  She was last around 2 weeks ago.  At that time, she was actually doing very well after a couple of months of shortness of breath.  This shortness of breath was attributed to her congestive heart failure and with the medication changes she was actually feeling pretty good.  Her lung function was stable.  We were focusing more in her breathing ability and activities of daily living.  She felt that she was unable to get exercise because of her shortness of breath and difficulty getting around.  Therefore she was more open to restarting her Berna Bue.  She felt that she never gave  her Berna Bue a good chance to work, and she was open to restarting this.  We continued her on Dulera 200/5 mcg 2 puffs twice daily as well as Spiriva 1.25 mcg 2 puffs once daily.  For her CVID, we continue with Cuvitru at the same dose.  In the interim, she has mostly done well. She came in for her Berna Bue today but we did not have it approved yet because she had stopped therapy. However, she was reporting to the injection nurse that she was having shortness of breath and her pulse ox was in the low 90s.  She tells me today that her apple watch told her that her pulse ox was at 90%.  She does not have a regular pulse ox to confirm this at home.  However, we got her vitals in the office and her pulse ox was 97%.  At the last visit, her pulse ox was 96%.  She seemed reassured once we told her her pulse ox was normal.  She has a history of pulmonary nodules which were thought to be related to recurrent pneumonias. She is scheduled to have another CT of her chest in June 2021.  She is a little bit nervous about this since her husband evidently passed away from lung cancer.  However, she tells me that she really trusts her current pulmonologist and will do what ever she recommends.  Asthma/Respiratory Symptom History: She remains on the [facility] 2 puffs twice daily as well as the Spiriva.  Her new pulmonologist is looking more closely at her pulmonary nodules.  Evidently, at one point, these were thought to be just secondary to her history of recurrent pneumonias.  However, her current pulmonologist thinks it might be something more.  She is scheduled to have a repeat chest CT in June 2021.  She has not had any worsening respiratory complaints.  She remains interested in starting [place] again.  Nonallergic Rhinitis Symptom History: She remains on nasal saline rinses as needed.  Recurrent Infections Symptom History: She remains on her weekly to every 2 weeks.  This seems to be going well for her.  She has had no  problems with the infusions.  She has not needed antibiotics since we saw her 2 weeks ago.  Otherwise, there have been no changes to her past medical history, surgical history, family history, or social history.    Review of Systems  Constitutional: Negative.  Negative for chills, fever, malaise/fatigue and weight loss.  HENT: Negative.  Negative for congestion, ear discharge, ear pain and sore throat.   Eyes: Negative for pain, discharge and redness.  Respiratory: Positive for shortness of breath. Negative for cough, sputum production and wheezing.   Cardiovascular: Negative.  Negative for chest pain and palpitations.  Gastrointestinal: Negative for abdominal pain, constipation, diarrhea, heartburn, nausea and vomiting.  Skin: Negative.  Negative for itching and rash.  Neurological: Negative for dizziness and headaches.  Endo/Heme/Allergies: Negative for environmental allergies. Does not bruise/bleed easily.       Objective:   Blood pressure 132/84, pulse 76, temperature 97.8 F (36.6 C), temperature source Temporal, resp. rate 20, SpO2 97 %. There is no height or weight  on file to calculate BMI.   Physical Exam:  Physical Exam  Constitutional: She appears well-developed.  Smiling and pleasant. Cooperative with the exam.   HENT:  Head: Normocephalic and atraumatic.  Right Ear: Tympanic membrane, external ear and ear canal normal.  Left Ear: Tympanic membrane, external ear and ear canal normal.  Nose: Mucosal edema and rhinorrhea present. No nasal deformity or septal deviation. No epistaxis. Right sinus exhibits no maxillary sinus tenderness and no frontal sinus tenderness. Left sinus exhibits no maxillary sinus tenderness and no frontal sinus tenderness.  Mouth/Throat: Uvula is midline and oropharynx is clear and moist. Mucous membranes are not pale and not dry.  Turbinates normal bilaterally with minimal discharge.  Eyes: Pupils are equal, round, and reactive to light.  Conjunctivae and EOM are normal. Right eye exhibits no chemosis and no discharge. Left eye exhibits no chemosis and no discharge. Right conjunctiva is not injected. Left conjunctiva is not injected.  Cardiovascular: Normal rate, regular rhythm and normal heart sounds.  Respiratory: Effort normal and breath sounds normal. No accessory muscle usage. No tachypnea. No respiratory distress. She has no wheezes. She has no rhonchi. She has no rales. She exhibits no tenderness.  Decreased air movement at the bases, but she sounds better than she normally does.  Lymphadenopathy:    She has no cervical adenopathy.  Neurological: She is alert.  Skin: No abrasion, no petechiae and no rash noted. Rash is not papular, not vesicular and not urticarial. No erythema. No pallor.  Psychiatric: She has a normal mood and affect.     Diagnostic studies:   Spirometry: results normal (FEV1: 1.61/69%, FVC: 2.14/68%, FEV1/FVC: 75%).    Spirometry consistent with possible restrictive disease.  Values are actually slightly higher than they were when I saw him 2 weeks ago.   Allergy Studies: none       Salvatore Marvel, MD  [facility] [place] [facility] [place]

## 2019-11-08 NOTE — Patient Instructions (Addendum)
1. Hypogammaglobulinemia - Continue with Cuvitru at the same dose. - Do consider getting the vaccine in the future (go for one of the mRNA vaccines such as Dance movement psychotherapist).  - IgG subclasses looked excellent.   2. Chronic rhinitis   - Continue with nasal saline rinses as needed.   3. Severe persistent asthma, uncomplicated - Lung function looks even better than when we saw your earlier this month. - Pulse ox was 97% today, which is excellent.   - We will continue to work on getting your Berna Bue approved again. - Daily controller medication(s): Dulera 200/61mcg two puffs twice daily with spacer and Spiriva 1.66mcg two puffs once daily - Prior to physical activity: ProAir 2 puffs 10-15 minutes before physical activity. - Rescue medications: ProAir 4 puffs every 4-6 hours as needed, albuterol nebulizer one vial every 4-6 hours as needed or DuoNeb nebulizer one vial every 4-6 hours as needed - Asthma control goals:  * Full participation in all desired activities (may need albuterol before activity) * Albuterol use two time or less a week on average (not counting use with activity) * Cough interfering with sleep two time or less a month * Oral steroids no more than once a year * No hospitalizations.  4. Return in about 3 months (around 02/06/2020). This can be an in-person, a virtual Webex or a telephone follow up visit.   Please inform us of any Emergency Department visits, hospitalizations, or changes in symptoms. Call us before going to the ED for breathing or allergy symptoms since we might be able to fit you in for a sick visit. Feel free to contact us anytime with any questions, problems, or concerns.  It was a pleasure to see you again today!  Websites that have reliable patient information: 1. American Academy of Asthma, Allergy, and Immunology: www.aaaai.org 2. Food Allergy Research and Education (FARE): foodallergy.org 3. Mothers of Asthmatics:  http://www.asthmacommunitynetwork.org 4. American College of Allergy, Asthma, and Immunology: www.acaai.org   COVID-19 Vaccine Information can be found at: ShippingScam.co.uk For questions related to vaccine distribution or appointments, please email vaccine@[email or web address] .com or call [phone].     "Like" [place] on Facebook and Instagram for our latest updates!        Make sure you are registered to vote! If you have moved or changed any of your contact information, you will need to get this updated before voting!  In some cases, you MAY be able to register to vote online: [email or web address]

## 2019-11-11 ENCOUNTER — Ambulatory Visit: Payer: Medicare Other | Admitting: Psychiatry

## 2019-11-12 DIAGNOSIS — M170 Bilateral primary osteoarthritis of knee: Secondary | ICD-10-CM | POA: Diagnosis not present

## 2019-11-20 ENCOUNTER — Ambulatory Visit (INDEPENDENT_AMBULATORY_CARE_PROVIDER_SITE_OTHER): Payer: Medicare Other | Admitting: Clinical

## 2019-11-20 ENCOUNTER — Other Ambulatory Visit: Payer: Self-pay

## 2019-11-20 DIAGNOSIS — F4310 Post-traumatic stress disorder, unspecified: Secondary | ICD-10-CM

## 2019-11-20 NOTE — Progress Notes (Signed)
Virtual Visit via Video Note  I connected with Laura [name] on 11/20/19 at 10:00 AM EST by a video enabled telemedicine application and verified that I am speaking with the correct person using two identifiers.  Location: Patient: Home  Provider: Office   I discussed the limitations of evaluation and management by telemedicine and the availability of in person appointments. The patient expressed understanding and agreed to proceed.     THERAPIST PROGRESS NOTE  Session Time: 10:00AM-10:40AM  Participation Level: Active  Behavioral Response: CasualAlertDepressed  Type of Therapy: Individual Therapy  Treatment Goals addressed: Diagnosis: PTSD  Interventions: CBT, Solution Focused and Strength-based  Summary: Laura [name] is a 59 y.o. female who presents with PTSD. The OPT therapist worked with the client for her initial session. The OPT therapist utilized Motivational Interviewing to assist in creating therapeutic repore. The patient in the session was engaged and work in collaboration giving feedback about her triggers and symptoms over the past few weeks including recovering from health problems, adjusting to living with family members, and difficulty with socialization. The OPT therapist utilized Cognitive Behavioral Therapy through cognitive restructuring as well as worked with the patient on coping strategies to assist in management of PTSD symptoms as well as work to empower the patient.    Suicidal/Homicidal: Nowithout intent/plan  Therapist Response: The OPT therapist worked with the patient for the patients initial scheduled session. The patient was engaged in her session and gave feedback in relation to triggers, symptoms, and behavior responses over the past 2 weeks. The OPT therapist worked with the patient utilizing an in session Cognitive Behavioral Therapy exercise. The patient was responsive in the session and verbalized, " I am working on rebuilding my self  confidence and like you said taking small steps, I am doing a body cleanse, working on diet, and walking a little further each day. The OPT therapist will continue treatment work with the patient in her next scheduled session  Plan: Return again in 2 weeks.  Diagnosis: Axis I: Post Traumatic Stress Disorder    Axis II: No diagnosis  I discussed the assessment and treatment plan with the patient. The patient was provided an opportunity to ask questions and all were answered. The patient agreed with the plan and demonstrated an understanding of the instructions.   The patient was advised to call back or seek an in-person evaluation if the symptoms worsen or if the condition fails to improve as anticipated.  I provided 40 minutes of non-face-to-face time during this encounter.  Lennox Grumbles, LCSW 11/20/2019

## 2019-11-22 ENCOUNTER — Other Ambulatory Visit: Payer: Self-pay | Admitting: Allergy & Immunology

## 2019-11-25 ENCOUNTER — Other Ambulatory Visit: Payer: Self-pay | Admitting: *Deleted

## 2019-11-25 MED ORDER — FASENRA PEN 30 MG/ML ~~LOC~~ SOAJ: 30.0000 mg | SUBCUTANEOUS | 9 refills | Status: DC

## 2019-11-25 NOTE — Telephone Encounter (Signed)
L/M for patient to contact me to advise approval and Rx to [facility] for Fasenra Pen

## 2019-11-25 NOTE — Telephone Encounter (Signed)
Spoke to patient and advised approval and submit Fasenra pen to [facility]

## 2019-11-26 ENCOUNTER — Encounter: Payer: Self-pay | Admitting: Cardiovascular Disease

## 2019-11-26 ENCOUNTER — Telehealth (INDEPENDENT_AMBULATORY_CARE_PROVIDER_SITE_OTHER): Payer: Medicare Other | Admitting: Cardiovascular Disease

## 2019-11-26 VITALS — Ht 64.0 in | Wt 254.0 lb

## 2019-11-26 DIAGNOSIS — Z8774 Personal history of (corrected) congenital malformations of heart and circulatory system: Secondary | ICD-10-CM

## 2019-11-26 DIAGNOSIS — I10 Essential (primary) hypertension: Secondary | ICD-10-CM

## 2019-11-26 DIAGNOSIS — I480 Paroxysmal atrial fibrillation: Secondary | ICD-10-CM

## 2019-11-26 DIAGNOSIS — I5032 Chronic diastolic (congestive) heart failure: Secondary | ICD-10-CM | POA: Diagnosis not present

## 2019-11-26 MED ORDER — TORSEMIDE 20 MG PO TABS: ORAL_TABLET | ORAL | 3 refills | Status: DC

## 2019-11-26 NOTE — Progress Notes (Signed)
Virtual Visit via Telephone Note   This visit type was conducted due to national recommendations for restrictions regarding the COVID-19 Pandemic (e.g. social distancing) in an effort to limit this patient's exposure and mitigate transmission in our community.  Due to her co-morbid illnesses, this patient is at least at moderate risk for complications without adequate follow up.  This format is felt to be most appropriate for this patient at this time.  The patient did not have access to video technology/had technical difficulties with video requiring transitioning to audio format only (telephone).  All issues noted in this document were discussed and addressed.  No physical exam could be performed with this format.  Please refer to the patient's chart for her  consent to telehealth for [facility].   Date:  11/26/2019   ID:  Laura [name], DOB 1960/12/27, MRN [id number]  Patient Location: Home Provider Location: Office  PCP:  [name], MD  Cardiologist:  Kate Sable, MD  Electrophysiologist:  None   Evaluation Performed:  Follow-Up Visit  Chief Complaint: Lower extremity edema  History of Present Illness:    Laura [name] is a 59 y.o. female with a history of chronic diastolic heart failure, paroxysmal atrial fibrillation, and ASD repair.  She is doing very well.  She denies chest pain, leg swelling, and palpitations.  She has only been taking torsemide 60 mg in the morning and has done well with this.  She has also been following with an allergist for her asthma.   Social history: She moved to [place] in 2019 from [place], [place], where she had lived with her daughter for 3 years. She is originally from [place] [facility]. She has a daughter who lives in [place], a daughter who lives in [place], and a daughter who lives in [place]. She is currently living with her sister. She is widowed. She was married for 30 years. Her husband died of bilateral pulmonary  embolism, he also had lung cancer and a brain tumor and a history of non-Hodgkin's lymphoma.  Past Medical History:  Diagnosis Date  . Asthma   . Atrial fibrillation ([place])   . Bipolar disorder ([place])   . Bronchiectasis ([place])   . CHF (congestive heart failure) ([place])   . Complication of anesthesia    "my Dr in [place] told me I had an allergic reaction to the combination of the pain meds and the anesthesia" she does not remember what happened but "I eneded up in the ICU for 5 days".  . H/O congenital atrial septal defect (ASD) repair   . Hypogammaglobulinemia ([place])   . Hypothyroid   . 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 Neuropathy   . Osteoarthritis   . PTSD (post-traumatic stress disorder)   . Recurrent sinusitis   . Short-term memory loss   . Sleep apnea   . TIA (transient ischemic attack)   . Tremors of nervous system    seeing PCP for this.  . Type 2 diabetes mellitus ([place])   . Urticaria   . Wound infection    Past Surgical History:  Procedure Laterality Date  . 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 CARDIAC SURGERY    . CARDIOVERSION    . CARDIOVERSION N/A 08/29/2018   Procedure: CARDIOVERSION;  Surgeon: Arnoldo Lenis, MD;  Location[facility];  Service: Endoscopy;  Laterality: N/A;  . [place]  . LEFT HEART CATH AND CORONARY ANGIOGRAPHY N/A 08/30/2019   Procedure: LEFT HEART  CATH AND CORONARY ANGIOGRAPHY;  Surgeon: [name], MD;  Location: [facility];  Service: Cardiovascular;  Laterality: N/A;  . NASAL SINUS SURGERY  2017     Current Meds  Medication Sig  . acetaminophen (TYLENOL) 325 MG tablet Take 2 tablets (650 mg total) by mouth every 6 (six) hours as needed for mild pain, fever or headache.  Marland Kitchen apixaban (ELIQUIS) 5 MG TABS tablet Take 5 mg by mouth 2 (two) times daily.  Marland Kitchen atorvastatin (LIPITOR) 10 MG tablet Take 10 mg by mouth every evening.   . Benralizumab (FASENRA PEN) 30 MG/ML SOAJ Inject 30 mg into the skin every 28 (twenty-eight)  days. For 3 doses then every 8 weeks  . diltiazem (CARDIZEM CD) 300 MG 24 hr capsule Take 1 capsule (300 mg total) by mouth every morning.  . divalproex (DEPAKOTE ER) 500 MG 24 hr tablet Take 3 tablets (1,500 mg total) by mouth 2 (two) times daily. 1000 mg in AM, 1500 mg in PM (Patient taking differently: Take 1,000-1,500 mg by mouth See admin instructions. 1000 mg in AM, 1500 mg in PM)  . Dulaglutide (TRULICITY) 1.5 0000000 SOPN Inject 1.5 mg into the skin every Sunday.   . gabapentin (NEURONTIN) 600 MG tablet Take 600 mg by mouth 3 (three) times daily.  Marland Kitchen HUMALOG KWIKPEN 100 UNIT/ML KwikPen Inject 7 Units into the skin 3 (three) times daily. Sliding scale.  . hydrALAZINE (APRESOLINE) 25 MG tablet Take 25 mg by mouth 3 (three) times daily.  . hydrOXYzine (ATARAX/VISTARIL) 25 MG tablet Take 1 tablet (25 mg total) by mouth every 6 (six) hours as needed for anxiety or nausea.  . Immune Globulin, Human, (CUVITRU) 4 GM/20ML SOLN Inject 100 mLs into the skin every 14 (fourteen) days.  . insulin glargine (LANTUS) 100 UNIT/ML injection Inject 60 Units into the skin at bedtime.  . lamoTRIgine (LAMICTAL) 25 MG tablet Take 1 tablet (25 mg total) by mouth daily.  Marland Kitchen LANTUS SOLOSTAR 100 UNIT/ML Solostar Pen SMARTSIG:50 Unit(s) SUB-Q Daily  . levothyroxine (SYNTHROID, LEVOTHROID) 112 MCG tablet Take 112 mcg by mouth daily before breakfast.  . Magnesium Oxide 400 MG CAPS Take 1 capsule (400 mg total) by mouth daily.  . Melatonin 3 MG TABS Take 3 mg by mouth at bedtime.  . metFORMIN (GLUCOPHAGE) 500 MG tablet Take 1,000 mg by mouth 2 (two) times daily.  . mometasone-formoterol (DULERA) 200-5 MCG/ACT AERO Inhale 2 puffs into the lungs 2 (two) times daily.  . ondansetron (ZOFRAN) 4 MG tablet Take 1 tablet (4 mg total) by mouth every 8 (eight) hours as needed for nausea or vomiting.  Glory Rosebush ULTRA test strip USE TO CHECK BLOOD SUGAR FOUR TIMES DAILY  . potassium chloride SA (KLOR-CON) 20 MEQ tablet TAKE 20 MEQ  ON DAYS YOU TAKE METOLAZONE.  Marland Kitchen Tiotropium Bromide Monohydrate (SPIRIVA RESPIMAT) 1.25 MCG/ACT AERS Inhale 2 puffs into the lungs daily.  Marland Kitchen torsemide (DEMADEX) 20 MG tablet Take 60 mg in the morning. Take 40 mg in the afternoon.  . VENTOLIN HFA 108 (90 Base) MCG/ACT inhaler INHALE 2 PUFFS INTO THE LUNGS EVERY 6 HOURS AS NEEDED FOR WHEEZING OR SHORTNESS OF BREATH     Allergies:   Ativan [lorazepam] and Phenergan [promethazine hcl]   Social History   Tobacco Use  . Smoking status: Former Smoker    Packs/day: 1.00    Years: 28.00    Pack years: 28.00    Types: Cigarettes    Start date: 1980  Quit date: 2008    Years since quitting: 13.1  . Smokeless tobacco: Never Used  Substance Use Topics  . Alcohol use: Not Currently  . Drug use: Not Currently     Family Hx: The patient's family history includes Alcohol abuse in her mother; Allergic rhinitis in her daughter; Bipolar disorder in her sister; Celiac disease in her daughter; Hashimoto's thyroiditis in her daughter; Heart attack in her mother; Heart disease in her brother and maternal aunt; Hypertension in her mother and sister; Kidney disease in her mother and sister; Stroke in her maternal aunt and mother; Thyroid disease in her daughter, daughter, daughter, and sister. There is no history of Asthma.  ROS:   Please see the history of present illness.     All other systems reviewed and are negative.   Prior CV studies:   The following studies were reviewed today:  Cardiac catheterization on 08/30/2019 showed no angiographically apparent coronary artery disease.  LVEDP was mildly elevated.   Echocardiogram on 08/29/2019 demonstrated normal LV systolic function, EF 60 to 65%.   Labs/Other Tests and Data Reviewed:    EKG:  No ECG reviewed.  Recent Labs: 08/29/2019: TSH 0.489 09/18/2019: ALT 16; B Natriuretic Peptide 208.0; Hemoglobin 12.3; Platelets 202 10/25/2019: BUN 18; Creatinine, Ser 0.83; Magnesium 1.5; Potassium  3.6; Sodium 143   Recent Lipid Panel Lab Results  Component Value Date/Time   CHOL 137 08/29/2019 07:18 AM   TRIG 101 08/29/2019 07:18 AM   HDL 52 08/29/2019 07:18 AM   CHOLHDL 2.6 08/29/2019 07:18 AM   LDLCALC 65 08/29/2019 07:18 AM    Wt Readings from Last 3 Encounters:  11/26/19 254 lb (115.2 kg)  10/30/19 253 lb 3.2 oz (114.9 kg)  10/09/19 251 lb (113.9 kg)     Objective:    Vital Signs:  Ht 5\' 4"  (1.626 m)   Wt 254 lb (115.2 kg)   BMI 43.60 kg/m    VITAL SIGNS:  reviewed  ASSESSMENT & PLAN:    1.  Chronic diastolic heart failure: Currently on torsemide 60 mg every morning.  Symptomatically stable.  No changes to therapy.  Renal function normal on 10/25/2019.  2.  Paroxysmal atrial fibrillation: Symptomatically stable.  Continue Cardizem CD 300 mg daily and Eliquis for systemic anticoagulation.  3.  History of ASD repair: No significant shunting by most recent echocardiogram in November 2020.  4.  Hypertension: No change to therapy.    COVID-19 Education: The signs and symptoms of COVID-19 were discussed with the patient and how to seek care for testing (follow up with PCP or arrange E-visit).  The importance of social distancing was discussed today.  Time:   Today, I have spent 15 minutes with the patient with telehealth technology discussing the above problems.     Medication Adjustments/Labs and Tests Ordered: Current medicines are reviewed at length with the patient today.  Concerns regarding medicines are outlined above.   Tests Ordered: No orders of the defined types were placed in this encounter.   Medication Changes: No orders of the defined types were placed in this encounter.   Follow Up:  In Person in 6 month(s)  Signed, Kate Sable, MD  11/26/2019 8:35 AM    [place]

## 2019-11-26 NOTE — Patient Instructions (Signed)
Medication Instructions:  Your physician recommends that you continue on your current medications as directed. Please refer to the Current Medication list given to you today.  *If you need a refill on your cardiac medications before your next appointment, please call your pharmacy*  Lab Work: None today If you have labs (blood work) drawn today and your tests are completely normal, you will receive your results only by: Marland Kitchen MyChart Message (if you have MyChart) OR . A paper copy in the mail If you have any lab test that is abnormal or we need to change your treatment, we will call you to review the results.  Testing/Procedures: None today  Follow-Up: At [facility], you and your health needs are our priority.  As part of our continuing mission to provide you with exceptional heart care, we have created designated Provider Care Teams.  These Care Teams include your primary Cardiologist (physician) and Advanced Practice Providers (APPs -  Physician Assistants and Nurse Practitioners) who all work together to provide you with the care you need, when you need it.  Your next appointment:   6 month(s)  The format for your next appointment:   In Person  Provider:   Kate Sable, MD  Other Instructions None      Thank you for choosing [facility] !

## 2019-11-26 NOTE — Addendum Note (Signed)
Addended by: Barbarann Ehlers A on: 11/26/2019 09:02 AM   Modules accepted: Orders
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
## 2019-11-28 ENCOUNTER — Ambulatory Visit: Payer: Medicare Other | Admitting: Psychiatry

## 2019-11-29 ENCOUNTER — Encounter (HOSPITAL_COMMUNITY): Payer: Self-pay | Admitting: Psychiatry

## 2019-11-29 ENCOUNTER — Ambulatory Visit (INDEPENDENT_AMBULATORY_CARE_PROVIDER_SITE_OTHER): Payer: Medicare Other | Admitting: Psychiatry

## 2019-11-29 ENCOUNTER — Telehealth (HOSPITAL_COMMUNITY): Payer: Self-pay | Admitting: *Deleted

## 2019-11-29 ENCOUNTER — Other Ambulatory Visit: Payer: Self-pay

## 2019-11-29 DIAGNOSIS — R251 Tremor, unspecified: Secondary | ICD-10-CM

## 2019-11-29 DIAGNOSIS — F0630 Mood disorder due to known physiological condition, unspecified: Secondary | ICD-10-CM | POA: Diagnosis not present

## 2019-11-29 DIAGNOSIS — F4310 Post-traumatic stress disorder, unspecified: Secondary | ICD-10-CM

## 2019-11-29 MED ORDER — DIVALPROEX SODIUM ER 500 MG PO TB24: 1500.0000 mg | ORAL_TABLET | Freq: Two times a day (BID) | ORAL | 0 refills | Status: DC

## 2019-11-29 MED ORDER — LAMOTRIGINE 25 MG PO TABS: 25.0000 mg | ORAL_TABLET | Freq: Every day | ORAL | 0 refills | Status: DC

## 2019-11-29 NOTE — Telephone Encounter (Signed)
Rx CALLED FOR CLARIFICATION ON THE :divalproex (DEPAKOTE ER) 500 MG 24 hr tablet  Take 3 tablets (1,500 mg total) by mouth 2 (two) times daily. 1000 mg in AM, 1500 mg in PM

## 2019-11-29 NOTE — Telephone Encounter (Signed)
It is 1000 mg in AM, 1500 mg in PM

## 2019-11-29 NOTE — Patient Instructions (Signed)
1.ContinueDepakote1000 mg,1500 mg at night - obtain labs (VPA, CMP, Plt) 2.Continuelamotrigine 25mg  daily 3.Next appointment:5/14 at 10 AM

## 2019-12-05 DIAGNOSIS — E113292 Type 2 diabetes mellitus with mild nonproliferative diabetic retinopathy without macular edema, left eye: Secondary | ICD-10-CM | POA: Diagnosis not present

## 2019-12-16 DIAGNOSIS — R0602 Shortness of breath: Secondary | ICD-10-CM | POA: Diagnosis not present

## 2019-12-16 DIAGNOSIS — J45909 Unspecified asthma, uncomplicated: Secondary | ICD-10-CM | POA: Diagnosis not present

## 2019-12-16 DIAGNOSIS — Z136 Encounter for screening for cardiovascular disorders: Secondary | ICD-10-CM | POA: Diagnosis not present

## 2019-12-16 DIAGNOSIS — Z6841 Body Mass Index (BMI) 40.0 and over, adult: Secondary | ICD-10-CM | POA: Diagnosis not present

## 2019-12-16 DIAGNOSIS — G9009 Other idiopathic peripheral autonomic neuropathy: Secondary | ICD-10-CM | POA: Diagnosis not present

## 2019-12-16 DIAGNOSIS — G259 Extrapyramidal and movement disorder, unspecified: Secondary | ICD-10-CM | POA: Diagnosis not present

## 2019-12-16 DIAGNOSIS — I509 Heart failure, unspecified: Secondary | ICD-10-CM | POA: Diagnosis not present

## 2019-12-16 DIAGNOSIS — F419 Anxiety disorder, unspecified: Secondary | ICD-10-CM | POA: Diagnosis not present

## 2019-12-16 DIAGNOSIS — F319 Bipolar disorder, unspecified: Secondary | ICD-10-CM | POA: Diagnosis not present

## 2019-12-16 DIAGNOSIS — R944 Abnormal results of kidney function studies: Secondary | ICD-10-CM | POA: Diagnosis not present

## 2019-12-16 DIAGNOSIS — D803 Selective deficiency of immunoglobulin G [IgG] subclasses: Secondary | ICD-10-CM | POA: Diagnosis not present

## 2019-12-18 ENCOUNTER — Other Ambulatory Visit: Payer: Self-pay

## 2019-12-18 ENCOUNTER — Ambulatory Visit (INDEPENDENT_AMBULATORY_CARE_PROVIDER_SITE_OTHER): Payer: Medicare Other

## 2019-12-18 DIAGNOSIS — J4550 Severe persistent asthma, uncomplicated: Secondary | ICD-10-CM

## 2019-12-18 MED ORDER — BENRALIZUMAB 30 MG/ML ~~LOC~~ SOSY
30.0000 mg | PREFILLED_SYRINGE | SUBCUTANEOUS | Status: DC
  Administered 2019-12-18 – 2020-07-03 (×4): 30 mg via SUBCUTANEOUS

## 2019-12-18 NOTE — Progress Notes (Signed)
Immunotherapy   Patient Details  Name: Laura [name] MRN: [id number] Date of Birth: [birth year]  12/18/2019  Patient restarted Berna Bue today. Patient received one 30mg /ml dose today and will receive doses every 4 weeks until she has received three doses then will advance to every 8 weeks. Consent on file and patient instructions given. Patient has an EpiPen and instructions were reviewed. Patient waited 30 minutes prior to leaving. No adverse reaction noted.    Cathi Roan 12/18/2019, 3:47 PM

## 2019-12-20 DIAGNOSIS — G250 Essential tremor: Secondary | ICD-10-CM | POA: Diagnosis not present

## 2019-12-20 DIAGNOSIS — R944 Abnormal results of kidney function studies: Secondary | ICD-10-CM | POA: Diagnosis not present

## 2019-12-20 DIAGNOSIS — F0630 Mood disorder due to known physiological condition, unspecified: Secondary | ICD-10-CM | POA: Diagnosis not present

## 2019-12-20 DIAGNOSIS — J4550 Severe persistent asthma, uncomplicated: Secondary | ICD-10-CM | POA: Diagnosis not present

## 2019-12-20 DIAGNOSIS — F4310 Post-traumatic stress disorder, unspecified: Secondary | ICD-10-CM | POA: Diagnosis not present

## 2019-12-20 DIAGNOSIS — I10 Essential (primary) hypertension: Secondary | ICD-10-CM | POA: Diagnosis not present

## 2019-12-20 DIAGNOSIS — I5032 Chronic diastolic (congestive) heart failure: Secondary | ICD-10-CM | POA: Diagnosis not present

## 2019-12-20 DIAGNOSIS — D801 Nonfamilial hypogammaglobulinemia: Secondary | ICD-10-CM | POA: Diagnosis not present

## 2019-12-20 DIAGNOSIS — E039 Hypothyroidism, unspecified: Secondary | ICD-10-CM | POA: Diagnosis not present

## 2019-12-20 DIAGNOSIS — E1142 Type 2 diabetes mellitus with diabetic polyneuropathy: Secondary | ICD-10-CM | POA: Diagnosis not present

## 2019-12-20 DIAGNOSIS — E782 Mixed hyperlipidemia: Secondary | ICD-10-CM | POA: Diagnosis not present

## 2019-12-20 DIAGNOSIS — I482 Chronic atrial fibrillation, unspecified: Secondary | ICD-10-CM | POA: Diagnosis not present

## 2019-12-23 ENCOUNTER — Ambulatory Visit (INDEPENDENT_AMBULATORY_CARE_PROVIDER_SITE_OTHER): Payer: Medicare Other | Admitting: Clinical

## 2019-12-23 ENCOUNTER — Other Ambulatory Visit: Payer: Self-pay

## 2019-12-23 DIAGNOSIS — F4310 Post-traumatic stress disorder, unspecified: Secondary | ICD-10-CM

## 2019-12-23 NOTE — Progress Notes (Addendum)
Virtual Visit via Telephone Note  I connected with Laura [name] on 12/23/19 at 11:00 AM EDT by telephone and verified that I am speaking with the correct person using two identifiers.  Location: Patient: Home Provider: Office    I discussed the limitations, risks, security and privacy concerns of performing an evaluation and management service by telephone and the availability of in person appointments. I also discussed with the patient that there may be a patient responsible charge related to this service. The patient expressed understanding and agreed to proceed.       THERAPIST PROGRESS NOTE  Session Time: 11:00AM-11:45AM  Participation Level: Active  Behavioral Response: CasualAlertDepressed  Type of Therapy: Individual Therapy  Treatment Goals addressed: Coping  Interventions: CBT, Motivational Interviewing and Solution Focused  Summary: Laura [name] is a 59 y.o. female who presents with PTSD. The OPT therapist worked with the patient for her ongoing OPT treatment session. The OPT therapist utilized Motivational Interviewing to assist in creating therapeutic repore. The patient in the session was engaged and work in collaboration giving feedback about her triggers and symptoms over the past few weeks including ongoing struggle to be social in situations with crowds. The OPT therapist utilized Cognitive Behavioral Therapy through cognitive restructuring as well as worked with the patient on coping strategies to assist in management of PTSD symptoms as well as work to empower the patient.   Suicidal/Homicidal: Nowithout intent/plan  Therapist Response: The OPT therapist worked with the patient for the patients scheduled session. The patient was engaged in her session and gave feedback in relation to triggers, symptoms, and behavior responses over the past few weeks. The OPT therapist worked with the patient utilizing an in session Cognitive Behavioral Therapy exercise. The  patient was responsive in the session and verbalized, " I am working pushing myself to be more social, get outside, and get some more sun". The OPT therapist worked with the patient on challenging her negative automatic thoughts and working to talk her self into being more social and decreasing self isolation while doing so progressively with her comfort level. The OPT therapist will continue treatment work with the patient in her next scheduled session   Plan: Return again in 2 weeks.  Diagnosis: Axis I: Post Traumatic Stress Disorder    Axis II: No diagnosis  I discussed the assessment and treatment plan with the patient. The patient was provided an opportunity to ask questions and all were answered. The patient agreed with the plan and demonstrated an understanding of the instructions.   The patient was advised to call back or seek an in-person evaluation if the symptoms worsen or if the condition fails to improve as anticipated.  I provided 45 minutes of non-face-to-face time during this encounter.  Laura Grumbles, LCSW 12/23/2019

## 2019-12-24 ENCOUNTER — Telehealth: Payer: Self-pay

## 2019-12-24 NOTE — Telephone Encounter (Signed)
Already sent Rx in this am

## 2019-12-24 NOTE — Telephone Encounter (Signed)
Laura [name],  Patient called stating she needed a refill on Cuvitru. I wasn't sure who to send it to until she mentioned that you were the one that normally takes care of it for her. I told her I would send you a message regarding the refill.

## 2019-12-31 DIAGNOSIS — Z23 Encounter for immunization: Secondary | ICD-10-CM | POA: Diagnosis not present

## 2020-01-02 ENCOUNTER — Other Ambulatory Visit: Payer: Self-pay | Admitting: Neurology

## 2020-01-06 ENCOUNTER — Ambulatory Visit (INDEPENDENT_AMBULATORY_CARE_PROVIDER_SITE_OTHER): Payer: Medicare Other | Admitting: Neurology

## 2020-01-06 ENCOUNTER — Other Ambulatory Visit: Payer: Self-pay

## 2020-01-06 ENCOUNTER — Encounter: Payer: Self-pay | Admitting: Neurology

## 2020-01-06 VITALS — BP 160/78 | HR 88 | Temp 97.6°F | Ht 64.0 in | Wt 245.0 lb

## 2020-01-06 DIAGNOSIS — R251 Tremor, unspecified: Secondary | ICD-10-CM | POA: Diagnosis not present

## 2020-01-06 NOTE — Progress Notes (Signed)
Subjective:    Patient ID: Stephnie Parlier is a 59 y.o. female.  HPI     Interim history:   Ms. Beane is a 59 year old right-handed woman with an underlying complex medical history of paroxysmal A. fib, congestive heart failure, followed by cardiology, type 2 diabetes with associated neuropathy, bronchiectasis and history of asthma, followed by pulmonology, recurrent sinusitis, osteoarthritis, IgE deficiency, bipolar disease/mood d/o, sleep apnea on CPAP therapy, and morbid obesity with a BMI of over 40, who Presents for follow-up consultation of her tremors after a longer gap of over 18 months.  The patient is re- referred by her primary care nurse practitioner, Pablo Lawrence. I reviewed her visit note from 12/20/2019.  I first met her on 07/03/2018, at which time the patient reported a longstanding history of hand tremors.  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  We talked about her medication effect at the time.  She was encouraged to proceed with a brain MRI and follow-up in 6 months.  She had a brain MRI With and without contrast on 07/18/2018 and I reviewed the results: IMPRESSION: Unremarkable MRI scan of the brain with and without contrast.  We called her with her test results.  She did not return for a follow-up appointment.  Today, 01/06/20: She reports that she has ongoing issues with hand tremors, primarily when she holds something or does something with fine motor skill requirement or when she feeds herself especially soup.  Her left hand seems to be more tremulous than her right.  Of note, her Abilify was discontinued about a year ago.  Her Depakote was recently reduced to 1000 mg in the morning and 1500 mg in the evening. Of note, she continues to be on multiple medications including diltiazem, Spiriva, Eliquis, atorvastatin, Dulera, melatonin, hydralazine, lamotrigine, levothyroxine,  alprazolam, Humalog, high-dose Depakote for a total of 8469 mg a day, Trulicity, Metformin, high-dose gabapentin 600 mg 3 times daily, Lantus insulin, torsemide. She tries to hydrate well with water.  She is trying to lose weight and thus far has lost about 9 pounds in a month.  She uses her CPAP regularly.  She follows with pulmonology.  The patient's allergies, current medications, family history, past medical history, past social history, past surgical history and problem list were reviewed and updated as appropriate.   Previously:   07/03/18: (She) reports a long-standing history of hand tremors. She has noticed worsening of her hand tremors on higher doses of Lamictal. She was up to 300 mg daily at one point but for the past 2 years or so has been on 50 mg daily.  She reports a history of TIA in the past with transient left-sided weakness. She had a brain MRI some years ago, results are not available for my review today. [place].  Of note, she is on multiple potentially sedating medications including Lamictal 50 mg daily, cyclobenzaprine 10 mg as needed, Depakote 1500 mg twice daily, gabapentin 600 mg 3 times a day and alprazolam 0.5 mg up to 2 pills daily. Of note, she presented to the emergency room earlier in September 2019 with chest pain, blood sugar level was in the 500s at the time. She states, her BS went up to 600s even. She is trying to eat well. A1c in June was 6.9.  She had EMG an NCV in the past.  She moved from [place] some  5 months ago, in April, has not seen a psychiatrist here yet, but saw a counselor and psychiatrist in [place] for years, since the 76s. She reports having a Dx of Depression, anxiety and PTSD, prior Hx of personality d/o, per patient report. She has sleep apnea, sees [facility] in pulmonology. Uses a CPAP. She has recently reduced the VPA to 3 in AM and 2 at night. Abilify was started about 6 months ago.   She has no family history of tremors or  Parkinson's disease but does not know much about her father's medical history or father's side of the medical history. As far as dizziness, she has noted lightheadedness especially when she stands up quickly or gets out of the shower. She has to hold on at times, thankfully no recent falls. Her husband passed away about 5 years ago, she has 3 children. She works at [employer], lives with her sister. She quit smoking in 2002, does not utilize alcohol and limits her coffee to 2 cups per day on average.  Her Past Medical History Is Significant For: Past Medical History:  Diagnosis Date  . Asthma   . Atrial fibrillation ([place])   . Bipolar disorder ([place])   . Bronchiectasis ([place])   . CHF (congestive heart failure) ([place])   . Complication of anesthesia    "my Dr in [place] told me I had an allergic reaction to the combination of the pain meds and the anesthesia" she does not remember what happened but "I eneded up in the ICU for 5 days".  . H/O congenital atrial septal defect (ASD) repair   . Hypogammaglobulinemia ([place])   . Hypothyroid   . IgE deficiency ([place])   . Neuropathy   . Osteoarthritis   . PTSD (post-traumatic stress disorder)   . Recurrent sinusitis   . Short-term memory loss   . Sleep apnea   . TIA (transient ischemic attack)   . Tremors of nervous system    seeing PCP for this.  . Type 2 diabetes mellitus ([place])   . Urticaria   . Wound infection     Her Past Surgical History Is Significant For: Past Surgical History:  Procedure Laterality Date  . 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 CARDIAC SURGERY    . CARDIOVERSION    . CARDIOVERSION N/A 08/29/2018   Procedure: CARDIOVERSION;  Surgeon: Arnoldo Lenis, MD;  Location[facility];  Service: Endoscopy;  Laterality: N/A;  . [place]  . LEFT HEART CATH AND CORONARY ANGIOGRAPHY N/A 08/30/2019   Procedure: LEFT HEART CATH AND CORONARY ANGIOGRAPHY;  Surgeon: [name], MD;  Location: [facility];   Service: Cardiovascular;  Laterality: N/A;  . NASAL SINUS SURGERY  2017    Her Family History Is Significant For: Family History  Problem Relation Age of Onset  . Hypertension Mother   . Stroke Mother   . Kidney disease Mother   . Heart attack Mother   . Alcohol abuse Mother   . Kidney disease Sister   . Heart disease Brother   . Stroke Maternal Aunt   . Heart disease Maternal Aunt   . Hypertension Sister   . Bipolar disorder Sister   . Thyroid disease Sister   . Thyroid disease Daughter   . Thyroid disease Daughter   . Hashimoto's thyroiditis Daughter   . Celiac disease Daughter   . Allergic rhinitis Daughter   . Thyroid disease Daughter   . Asthma Neg Hx  Her Social History Is Significant For: Social History   Socioeconomic History  . Marital status: Widowed    Spouse name: Not on file  . Number of children: Not on file  . Years of education: Not on file  . Highest education level: Not on file  Occupational History  . Not on file  Tobacco Use  . Smoking status: Former Smoker    Packs/day: 1.00    Years: 28.00    Pack years: 28.00    Types: Cigarettes    Start date: 82    Quit date: 2008    Years since quitting: 13.2  . Smokeless tobacco: Never Used  Substance and Sexual Activity  . Alcohol use: Not Currently  . Drug use: Not Currently  . Sexual activity: Yes    Birth control/protection: None  Other Topics Concern  . Not on file  Social History Narrative  . Not on file   Social Determinants of Health   Financial Resource Strain: Unknown  . Difficulty of Paying Living Expenses: Patient refused  Food Insecurity: Unknown  . Worried About Charity fundraiser in the Last Year: Patient refused  . Ran Out of Food in the Last Year: Patient refused  Transportation Needs: Unknown  . Lack of Transportation (Medical): Patient refused  . Lack of Transportation (Non-Medical): Patient refused  Physical Activity: Unknown  . Days of Exercise per Week:  Patient refused  . Minutes of Exercise per Session: Patient refused  Stress: Unknown  . Feeling of Stress : Patient refused  Social Connections: Unknown  . Frequency of Communication with Friends and Family: Patient refused  . Frequency of Social Gatherings with Friends and Family: Patient refused  . Attends Religious Services: Patient refused  . Active Member of Clubs or Organizations: Patient refused  . Attends Archivist Meetings: Patient refused  . Marital Status: Patient refused    Her Allergies Are:  Allergies  Allergen Reactions  . Ativan [Lorazepam] Hives  . Phenergan [Promethazine Hcl] Hives  :   Her Current Medications Are:  Outpatient Encounter Medications as of 01/06/2020  Medication Sig  . acetaminophen (TYLENOL) 325 MG tablet Take 2 tablets (650 mg total) by mouth every 6 (six) hours as needed for mild pain, fever or headache.  Marland Kitchen apixaban (ELIQUIS) 5 MG TABS tablet Take 5 mg by mouth 2 (two) times daily.  Marland Kitchen atorvastatin (LIPITOR) 10 MG tablet Take 10 mg by mouth every evening.   . Benralizumab (FASENRA PEN) 30 MG/ML SOAJ Inject 30 mg into the skin every 28 (twenty-eight) days. For 3 doses then every 8 weeks  . diltiazem (CARDIZEM CD) 300 MG 24 hr capsule Take 1 capsule (300 mg total) by mouth every morning.  . divalproex (DEPAKOTE ER) 500 MG 24 hr tablet Take 3 tablets (1,500 mg total) by mouth 2 (two) times daily. 1000 mg in AM, 1500 mg in PM  . Dulaglutide (TRULICITY) 1.5 JO/8.3GP SOPN Inject 1.5 mg into the skin every Sunday.   . gabapentin (NEURONTIN) 600 MG tablet Take 600 mg by mouth 3 (three) times daily.  Marland Kitchen HUMALOG KWIKPEN 100 UNIT/ML KwikPen Inject 7 Units into the skin 3 (three) times daily. Sliding scale.  . hydrALAZINE (APRESOLINE) 25 MG tablet Take 25 mg by mouth 3 (three) times daily.  . hydrOXYzine (ATARAX/VISTARIL) 25 MG tablet Take 1 tablet (25 mg total) by mouth every 6 (six) hours as needed for anxiety or nausea.  . Immune Globulin, Human,  (CUVITRU) 4 GM/20ML SOLN Inject 100  mLs into the skin every 14 (fourteen) days.  . insulin glargine (LANTUS) 100 UNIT/ML injection Inject 60 Units into the skin at bedtime.  Derrill Memo ON 01/09/2020] lamoTRIgine (LAMICTAL) 25 MG tablet Take 1 tablet (25 mg total) by mouth daily.  Marland Kitchen LANTUS SOLOSTAR 100 UNIT/ML Solostar Pen SMARTSIG:50 Unit(s) SUB-Q Daily  . levothyroxine (SYNTHROID, LEVOTHROID) 112 MCG tablet Take 112 mcg by mouth daily before breakfast.  . Magnesium Oxide 400 MG CAPS Take 1 capsule (400 mg total) by mouth daily.  . Melatonin 3 MG TABS Take 3 mg by mouth at bedtime.  . metFORMIN (GLUCOPHAGE) 500 MG tablet Take 1,000 mg by mouth 2 (two) times daily.  . mometasone-formoterol (DULERA) 200-5 MCG/ACT AERO Inhale 2 puffs into the lungs 2 (two) times daily.  . ondansetron (ZOFRAN) 4 MG tablet Take 1 tablet (4 mg total) by mouth every 8 (eight) hours as needed for nausea or vomiting.  Glory Rosebush ULTRA test strip USE TO CHECK BLOOD SUGAR FOUR TIMES DAILY  . potassium chloride SA (KLOR-CON) 20 MEQ tablet TAKE 20 MEQ ON DAYS YOU TAKE METOLAZONE.  Marland Kitchen Tiotropium Bromide Monohydrate (SPIRIVA RESPIMAT) 1.25 MCG/ACT AERS Inhale 2 puffs into the lungs daily.  Marland Kitchen torsemide (DEMADEX) 20 MG tablet Take 60 mg daily  . VENTOLIN HFA 108 (90 Base) MCG/ACT inhaler INHALE 2 PUFFS INTO THE LUNGS EVERY 6 HOURS AS NEEDED FOR WHEEZING OR SHORTNESS OF BREATH   Facility-Administered Encounter Medications as of 01/06/2020  Medication  . Benralizumab SOSY 30 mg  :  Review of Systems:  Out of a complete 14 point review of systems, all are reviewed and negative with the exception of these symptoms as listed below:  Review of Systems  Neurological:       Pt presents today to discuss her tremors. Pt notices the tremors are worse in her left hand. She is right handed. She reports that the tremors have worsened since her last visit here. She also reports an increase in her stress.    Objective:  Neurological  Exam  Physical Exam Physical Examination:   Vitals:   01/06/20 1347  BP: (!) 160/78  Pulse: 88  Temp: 97.6 F (36.4 C)   General Examination: The patient is a very pleasant 59 y.o. female in no acute distress. She appears well-developed and well-nourished and well groomed.   HEENT: Normocephalic, atraumatic, pupils are equal, round and reactive to light and accommodation. Corrective eyeglasses in place. Extraocular tracking is fairly well-preserved. No facial masking. She has a normal nuchal tone.  Airway examination shows a mild to moderately crowded airway, moderate mouth dryness, normal tongue movements. She has no facial or voice tremor.  Chest: CTA, no wheezing or crackles noted.  Heart: S1+S2+0, regular and normal without murmurs, rubs or gallops noted.   Abdomen: Soft, non-tender and non-distended with normal bowel sounds appreciated on auscultation.  Extremities: There is no pitting edema in the distal lower extremities bilaterally. Pedal pulses are intact.  Skin: Warm and dry without trophic changes noted.  Musculoskeletal: exam reveals no obvious joint deformities, tenderness or joint swelling or erythema.   Neurologically:  Mental status: The patient is awake, alert and oriented in all 4 spheres. Her immediate and remote memory, attention, language skills and fund of knowledge are appropriate. There is no evidence of aphasia, agnosia, apraxia or anomia. Speech is clear with normal prosody and enunciation. Thought process is linear. Mood is normal and affect is normal.  Cranial nerves II - XII are as described above  under HEENT exam. In addition: shoulder shrug is normal with equal shoulder height noted. Motor exam: Normal bulk, strength and tone is noted. She has no resting tremor, no lower extremity resting tremor. She has a mild postural tremor in both upper extremities, also with with action.   (On 07/03/2018: On Archimedes spiral drawing she has a mild tremor  with the left, she has no significant difficulty with her right hand which is her dominant hand, handwriting is legible, not particularly tremulous or micrographic).   On fine motor testing she has mild difficulty globally with fine motor skills including finger taps, hand movements and rapid alternating patting, no lateralization noted. Reflexes are 1+ in the upper extremities, trace in the knees and ankles. On sensory testing she has intact sensation to LT. Cerebellar testing: No dysmetria or intention tremor. There is no truncal or gait ataxia.  Gait, station and balance: She stands without difficulty, is able to stand narrow based, walks cautiously, normal arm swing noted bilaterally.  Assessment and Plan:    In summary, Nataliyah Packham is a very pleasant 59 year old female with an underlying complex medical history of paroxysmal A. fib, congestive heart failure, followed by cardiology, type 2 diabetes with associated neuropathy, bronchiectasis and history of asthma, followed by pulmonology, recurrent sinusitis, osteoarthritis, IgE deficiency, bipolar disease/mood d/o, sleep apnea on CPAP therapy, and morbid obesity with a BMI of over 40, who presents for re-evaluation of her tremors.  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  These findings have improved and are not apparent on exam today.  Her hand tremor is fairly stable.  We had a lengthy discussion again today about her tremor origin and potential treatment option.  She may have underlying essential tremor, whether or not it is exacerbated by certain medications or whether or not she has drug-induced tremors will be difficult to tease out.  She is on a higher dose of Depakote and has been on it for years.  She would like to consider coming off of it.  She is encouraged to talk to her psychiatrist about this.  She has  recently reduced it from '3000mg'$  to 2500 mg daily.  We talked about symptomatic treatment options for hand tremors and essential tremor.  Unfortunately, she is not a good candidate for beta-blocker given her history of depression, and her history of asthma.  In addition, she is also on diltiazem and in conjunction with a beta-blocker, she may have low blood pressure and low pulse rate.  Mysoline is a first-line treatment for essential tremor but in her case I would be rather cautious because of a potential interaction with her Eliquis and reduction in efficacy of her Eliquis.  In addition, it could cause sleepiness and balance problems in combination with her other psychotropic medications.  She is not keen on starting yet another medication.  We talked about supportive treatments such as good hydration, stress reduction, enough rest.  She is pursuing all of these, she is also pursuing weight loss and is commended for her weight loss endeavors thus far.  She is encouraged to consider a second opinion with another movement disorder specialist.  She is advised to talk to her primary care about this.  From my end of things I did not suggest any new test or new medications at this time.  I would be happy to see her back  as needed.  I answered all her questions today and she was in agreement. I spent 30 minutes in total face-to-face time and in reviewing records during pre-charting, more than 50% of which was spent in counseling and coordination of care, reviewing test results, reviewing medications and treatment regimen and/or in discussing or reviewing the diagnosis of tremor, the prognosis and treatment options. Pertinent laboratory and imaging test results that were available during this visit with the patient were reviewed by me and considered in my medical decision making (see chart for details).

## 2020-01-06 NOTE — Patient Instructions (Signed)
You have a tremor of both hands.  I do not see any signs or symptoms of parkinson's like disease or what we call parkinsonism.   For your tremor, I would not recommend any new medication for fear of side effects (especially sleepiness) or medication interactions, especially in light of your asthma and depression history, you are not a good candidate for beta-blocker.  Also, if we were to try with Mysoline or primidone for symptomatic tremor management of your hand tremor, there may be an interaction with your Eliquis and Mysoline may add yet another medication that can cause you to feel sleepy and off balance.  Please remember, that any kind of tremor may be exacerbated by anxiety, anger, nervousness, excitement, dehydration, sleep deprivation, by caffeine, and low blood sugar values or blood sugar fluctuations or thyroid dysfunction.  Please make sure you have a follow-up appointment with your primary care physician or nurse practitioner on a regular basis.  Medications that may contribute to your tremor primarily include your Depakote at this time.  Please discuss this with your psychiatrist.  In addition, you may benefit from a second opinion with another movement disorder specialist.  Please talk to your primary care physician or nurse practitioner about a referral to another neurologist if you would like.  I would be happy to see you back as needed.

## 2020-01-11 ENCOUNTER — Other Ambulatory Visit (HOSPITAL_COMMUNITY): Payer: Self-pay | Admitting: Psychiatry

## 2020-01-13 ENCOUNTER — Ambulatory Visit (INDEPENDENT_AMBULATORY_CARE_PROVIDER_SITE_OTHER): Payer: Medicare Other | Admitting: Clinical

## 2020-01-13 ENCOUNTER — Telehealth: Payer: Self-pay

## 2020-01-13 ENCOUNTER — Other Ambulatory Visit: Payer: Self-pay

## 2020-01-13 DIAGNOSIS — F4310 Post-traumatic stress disorder, unspecified: Secondary | ICD-10-CM

## 2020-01-13 NOTE — Progress Notes (Signed)
Virtual Visit via Video Note  I connected with Laura [name] on 01/13/20 at  1:00 PM EDT by a video enabled telemedicine application and verified that I am speaking with the correct person using two identifiers.  Location: Patient: Home Provider: Office   I discussed the limitations of evaluation and management by telemedicine and the availability of in person appointments. The patient expressed understanding and agreed to proceed.          THERAPIST PROGRESS NOTE  Session Time: 1:00PM-1:45PM  Participation Level: Active  Behavioral Response: CasualAlertDepressed  Type of Therapy: Individual Therapy  Treatment Goals addressed: Coping  Interventions: CBT, DBT and Supportive  Summary: Laura [name] is a 59 y.o. female who presents with PTSD.The OPT therapist worked with the patient for herongoing OPT treatment session. The OPT therapist utilized Motivational Interviewing to assist in creating therapeutic repore. The patient in the session was engaged and work in Science writer about hertriggers and symptoms over the past few weeksincluding the death of her nephew unexpectedly 2 weeks ago in a car accident and how going through this experience triggered the patient with reflections of her husbands passing. The OPT therapist utilized Cognitive Behavioral Therapy through cognitive restructuring as well as worked with the patient on coping strategies to assist in management ofPTSD symptoms as well as work to Environmental health practitioner the patient  Suicidal/Homicidal: Nowithout intent/plan  Therapist Response: The OPT therapist worked with the patient for the patients scheduled session. The patient was engaged in hersession and gave feedback in relation to triggers, symptoms, and behavior responses over the past few weeks. The OPT therapist worked with the patient utilizing an in session Cognitive Behavioral Therapy exercise. The patient was responsive in the session and verbalized, "  I am going to go spend some time with family and this will help me get out and enjoy the Sun I am going to go fishing and go with some family to the [facility] in [place]". The OPT therapist continued in this session to work with the patient on challenging her negative automatic thoughts and working to talk her self into being more social and decreasing self isolation while doing so progressively with her comfort level. The OPT therapist acknowledged the patients hardship in losing a loved one and promoted the needed purposeful distraction of going to spend time with other family members with this being linked to the patients desire to get outside more. The OPT therapist will continue treatment work with the patient in hernext scheduled session  Plan: Return again in 2/3 weeks.  Diagnosis: Axis I: Post Traumatic Stress Disorder     Axis II: No diagnosis  I discussed the assessment and treatment plan with the patient. The patient was provided an opportunity to ask questions and all were answered. The patient agreed with the plan and demonstrated an understanding of the instructions.   The patient was advised to call back or seek an in-person evaluation if the symptoms worsen or if the condition fails to improve as anticipated.  I provided 45 minutes of non-face-to-face time during this encounter.   Lennox Grumbles, LCSW 01/13/2020

## 2020-01-13 NOTE — Telephone Encounter (Signed)
Patient called letting us know she didn't receive her shipment of Berna Bue yet, she will call back after she speaks to the specialty pharmacy to confirm her appointment 01/15/2020 @ 10:30am.

## 2020-01-15 ENCOUNTER — Other Ambulatory Visit: Payer: Self-pay

## 2020-01-15 ENCOUNTER — Ambulatory Visit (INDEPENDENT_AMBULATORY_CARE_PROVIDER_SITE_OTHER): Payer: Medicare Other

## 2020-01-15 ENCOUNTER — Ambulatory Visit: Payer: Self-pay

## 2020-01-15 DIAGNOSIS — J4550 Severe persistent asthma, uncomplicated: Secondary | ICD-10-CM | POA: Diagnosis not present

## 2020-01-17 ENCOUNTER — Other Ambulatory Visit (HOSPITAL_COMMUNITY)
Admission: RE | Admit: 2020-01-17 | Discharge: 2020-01-17 | Disposition: A | Discharge status: Home / Self Care | Payer: Medicare Other | Source: Ambulatory Visit | Attending: Psychiatry | Admitting: Psychiatry

## 2020-01-17 DIAGNOSIS — F0630 Mood disorder due to known physiological condition, unspecified: Secondary | ICD-10-CM | POA: Insufficient documentation

## 2020-01-17 LAB — COMPREHENSIVE METABOLIC PANEL
ALT: 16 U/L (ref 0–44)
AST: 24 U/L (ref 15–41)
Albumin: 3.9 g/dL (ref 3.5–5.0)
Alkaline Phosphatase: 62 U/L (ref 38–126)
Anion gap: 12 (ref 5–15)
BUN: 13 mg/dL (ref 6–20)
CO2: 28 mmol/L (ref 22–32)
Calcium: 9.3 mg/dL (ref 8.9–10.3)
Chloride: 97 mmol/L — ABNORMAL LOW (ref 98–111)
Creatinine, Ser: 0.89 mg/dL (ref 0.44–1.00)
GFR calc Af Amer: >60 mL/min (ref >60)
GFR calc non Af Amer: >60 mL/min (ref >60)
Glucose, Bld: 228 mg/dL — ABNORMAL HIGH (ref 70–99)
Potassium: 4 mmol/L (ref 3.5–5.1)
Sodium: 137 mmol/L (ref 135–145)
Total Bilirubin: 0.6 mg/dL (ref 0.3–1.2)
Total Protein: 7 g/dL (ref 6.5–8.1)

## 2020-01-17 LAB — CBC
HCT: 38.6 % (ref 36.0–46.0)
Hemoglobin: 13.1 g/dL (ref 12.0–15.0)
MCH: 32.4 pg (ref 26.0–34.0)
MCHC: 33.9 g/dL (ref 30.0–36.0)
MCV: 95.5 fL (ref 80.0–100.0)
Platelets: 179 10*3/uL (ref 150–400)
RBC: 4.04 MIL/uL (ref 3.87–5.11)
RDW: 12.8 % (ref 11.5–15.5)
WBC: 4.9 10*3/uL (ref 4.0–10.5)
nRBC: 0 % (ref 0.0–0.2)

## 2020-01-17 LAB — VALPROIC ACID LEVEL: Valproic Acid Lvl: 61 ug/mL (ref 50.0–100.0)

## 2020-01-20 ENCOUNTER — Encounter (HOSPITAL_COMMUNITY): Payer: Self-pay | Admitting: Psychiatry

## 2020-01-22 DIAGNOSIS — G250 Essential tremor: Secondary | ICD-10-CM | POA: Diagnosis not present

## 2020-01-30 DIAGNOSIS — Z23 Encounter for immunization: Secondary | ICD-10-CM | POA: Diagnosis not present

## 2020-02-03 ENCOUNTER — Ambulatory Visit (INDEPENDENT_AMBULATORY_CARE_PROVIDER_SITE_OTHER): Payer: Medicare Other | Admitting: Pulmonary Disease

## 2020-02-03 ENCOUNTER — Other Ambulatory Visit: Payer: Self-pay

## 2020-02-03 ENCOUNTER — Encounter: Payer: Self-pay | Admitting: Pulmonary Disease

## 2020-02-03 VITALS — BP 124/70 | HR 76 | Temp 98.4°F | Ht 64.0 in | Wt 238.0 lb

## 2020-02-03 DIAGNOSIS — J4551 Severe persistent asthma with (acute) exacerbation: Secondary | ICD-10-CM | POA: Diagnosis not present

## 2020-02-03 MED ORDER — MONTELUKAST SODIUM 10 MG PO TABS: 10.0000 mg | ORAL_TABLET | Freq: Every day | ORAL | 11 refills | Status: DC

## 2020-02-03 MED ORDER — PREDNISONE 10 MG PO TABS: ORAL_TABLET | ORAL | 0 refills | Status: AC

## 2020-02-03 NOTE — Progress Notes (Signed)
Subjective:   PATIENT ID: Laura [name] GENDER: female DOB: 11/28/1960, MRN: [id number]  Synopsis: Former patient of 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   HPI  Chief Complaint  Patient presents with  . Follow-up    increased SOB and whezzing with activity,     Reason for Visit: Follow-up  Laura [name] is a 59 year old female with bronchiectasis, CVID, OSA who presents for follow-up.  She is requiring albuterol inhaler more frequently for shortness of breath and wheezing. She is compliant with Dulera and Spiriva. She is due for [facility] today.  She has recent stressors at home with her sister who recently suffered a fall complicated with thoracic fractures. They are having difficulty with the case workers.  Reviewed progress note from 10/31/19 from Allergy with Dr. Ernst Bowler - Continues on Vuvitru q2weeks and [place] in [place] office.   ACT: 9  Social History: Former smoker. 28 pack years. Quit in 2008.  I have personally reviewed patient's past medical/family/social history/allergies/current medications.  Past Medical History:  Diagnosis Date  . Asthma   . Atrial fibrillation ([place])   . Bipolar disorder ([place])   . Bronchiectasis ([place])   . CHF (congestive heart failure) ([place])   . Complication of anesthesia    "my Dr in [place] told me I had an allergic reaction to the combination of the pain meds and the anesthesia" she does not remember what happened but "I eneded up in the ICU for 5 days".  . H/O congenital atrial septal defect (ASD) repair   . Hypogammaglobulinemia ([place])   . Hypothyroid   . 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 Neuropathy   . Osteoarthritis   . PTSD (post-traumatic stress disorder)   . Recurrent sinusitis   . Short-term memory loss   . Sleep apnea   . TIA (transient ischemic attack)   . Tremors of nervous system    seeing PCP for this.  . Type 2 diabetes mellitus ([place])   . Urticaria   . Wound  infection      Allergies  Allergen Reactions  . Ativan [Lorazepam] Hives  . Phenergan [Promethazine Hcl] Hives     Outpatient Medications Prior to Visit  Medication Sig Dispense Refill  . acetaminophen (TYLENOL) 325 MG tablet Take 2 tablets (650 mg total) by mouth every 6 (six) hours as needed for mild pain, fever or headache. 30 tablet 1  . apixaban (ELIQUIS) 5 MG TABS tablet Take 5 mg by mouth 2 (two) times daily.    Marland Kitchen atorvastatin (LIPITOR) 10 MG tablet Take 10 mg by mouth every evening.     . Benralizumab (FASENRA PEN) 30 MG/ML SOAJ Inject 30 mg into the skin every 28 (twenty-eight) days. For 3 doses then every 8 weeks 1 pen 9  . diltiazem (CARDIZEM CD) 300 MG 24 hr capsule Take 1 capsule (300 mg total) by mouth every morning. 90 capsule 3  . divalproex (DEPAKOTE ER) 500 MG 24 hr tablet Take 3 tablets (1,500 mg total) by mouth 2 (two) times daily. 1000 mg in AM, 1500 mg in PM 450 tablet 0  . Dulaglutide (TRULICITY) 1.5 0000000 SOPN Inject 1.5 mg into the skin every Sunday.     . gabapentin (NEURONTIN) 600 MG tablet Take 600 mg by mouth 3 (three) times daily.    Marland Kitchen HUMALOG KWIKPEN 100 UNIT/ML KwikPen Inject 7 Units into the skin 3 (three) times daily. Sliding scale.    . hydrALAZINE (  APRESOLINE) 25 MG tablet Take 25 mg by mouth 3 (three) times daily.    . hydrOXYzine (ATARAX/VISTARIL) 25 MG tablet Take 1 tablet (25 mg total) by mouth every 6 (six) hours as needed for anxiety or nausea. 30 tablet 0  . Immune Globulin, Human, (CUVITRU) 4 GM/20ML SOLN Inject 100 mLs into the skin every 14 (fourteen) days.    . insulin glargine (LANTUS) 100 UNIT/ML injection Inject 60 Units into the skin at bedtime.    . lamoTRIgine (LAMICTAL) 25 MG tablet Take 1 tablet (25 mg total) by mouth daily. 90 tablet 0  . LANTUS SOLOSTAR 100 UNIT/ML Solostar Pen SMARTSIG:50 Unit(s) SUB-Q Daily    . levothyroxine (SYNTHROID, LEVOTHROID) 112 MCG tablet Take 112 mcg by mouth daily before breakfast.    . Magnesium  Oxide 400 MG CAPS Take 1 capsule (400 mg total) by mouth daily. 90 capsule 3  . Melatonin 3 MG TABS Take 3 mg by mouth at bedtime.    . metFORMIN (GLUCOPHAGE) 500 MG tablet Take 1,000 mg by mouth 2 (two) times daily.    . mometasone-formoterol (DULERA) 200-5 MCG/ACT AERO Inhale 2 puffs into the lungs 2 (two) times daily. 13 g 5  . ondansetron (ZOFRAN) 4 MG tablet Take 1 tablet (4 mg total) by mouth every 8 (eight) hours as needed for nausea or vomiting. 20 tablet 0  . ONETOUCH ULTRA test strip USE TO CHECK BLOOD SUGAR FOUR TIMES DAILY    . potassium chloride SA (KLOR-CON) 20 MEQ tablet TAKE 20 MEQ ON DAYS YOU TAKE METOLAZONE. 30 tablet 6  . Tiotropium Bromide Monohydrate (SPIRIVA RESPIMAT) 1.25 MCG/ACT AERS Inhale 2 puffs into the lungs daily. 4 g 5  . torsemide (DEMADEX) 20 MG tablet Take 60 mg daily 90 tablet 3  . VENTOLIN HFA 108 (90 Base) MCG/ACT inhaler INHALE 2 PUFFS INTO THE LUNGS EVERY 6 HOURS AS NEEDED FOR WHEEZING OR SHORTNESS OF BREATH 18 g 1   Facility-Administered Medications Prior to Visit  Medication Dose Route Frequency Provider Last Rate Last Admin  . Benralizumab SOSY 30 mg  30 mg Subcutaneous Q28 days Valentina Shaggy, MD   30 mg at 01/15/20 1133    Review of Systems  Constitutional: Negative for chills, diaphoresis, fever, malaise/fatigue and weight loss.  HENT: Negative for congestion.   Respiratory: Positive for shortness of breath. Negative for cough, hemoptysis, sputum production and wheezing.   Cardiovascular: Negative for chest pain, palpitations and leg swelling.     Objective:   Vitals:   02/03/20 1023  BP: 124/70  Pulse: 76  Temp: 98.4 F (36.9 C)  TempSrc: Temporal  SpO2: 97%  Weight: 238 lb (108 kg)  Height: 5\' 4"  (1.626 m)   SpO2: 97 % O2 Device: None (Room air)  Physical Exam: General: Well-appearing, no acute distress HENT: [place], AT Eyes: EOMI, no scleral icterus Respiratory: Clear to auscultation bilaterally.  No crackles, wheezing or  rales Cardiovascular: RRR, -M/R/G, no JVD Extremities:-Edema,-tenderness Neuro: AAO x4, CNII-XII grossly intact Skin: Intact, no rashes or bruising Psych: Normal mood, normal affect  Data Reviewed:  Imaging: CTA 09/18/19 - No pulmonary embolism. Nodule ~56mm in right lung base. Minimal right pleural effusion.  PFT: 06/21/19 Spirometry FVC 2.2 (70%) FEV1 1.5 (64%) Ratio 68  Interpretation: Moderate obstructive lung defect is present  Imaging, labs and tests noted above have been reviewed independently by me.    Assessment & Plan:   Discussion: 59 year old female with bronchiectasis, CVID on immunoglobulin therapy, asthma and  OSA who presents for follow-up. Continues to have symptoms on LABA/ICS and is in active exacerbation today.  Severe Persistent Asthma with acute exacerbation CONTINUE Dulera 200-5 mcg TWO puffs TWICE a day CONTINUE Spiriva 1.25 mcg TWO puffs ONCE a day CONTINUE Fasenra when available CONTINUE Albuterol for shortness of breath or wheezing AS NEEDED Take prednisone taper as prescribed START montelukast 10 mg daily  Bronchiectasis CONTINUE flutter valve  Heart failure CONTINUE torsemide as instructed per Cardiology  RLL lung nodule ~6-45mm - Husband at history of lung cancer Will schedule CT Chest in June 2021.   Health Maintenance Immunization History  Administered Date(s) Administered  . Influenza Split 08/24/2018  . Influenza Whole 07/15/2019  . Influenza,inj,Quad PF,6+ Mos 07/10/2017  . Moderna SARS-COVID-2 Vaccination 12/31/2019, 01/30/2020  . Pneumococcal-Unspecified 10/10/2012   CT Lung Screen - not indicated  No orders of the defined types were placed in this encounter.  No orders of the defined types were placed in this encounter.   No follow-ups on file.  Andrews, MD [facility] 02/03/2020 10:34 AM  Office Number [phone]

## 2020-02-03 NOTE — Patient Instructions (Addendum)
Severe Persistent Asthma Asthma Exacerbation CONTINUE Dulera 200-5 mcg TWO puffs TWICE a day CONTINUE Spiriva 1.25 mcg TWO puffs ONCE a day CONTINUE Fasenra when available CONTINUE Albuterol for shortness of breath or wheezing AS NEEDED Take prednisone taper as prescribed START montelukast 10 mg daily  Follow-up 6 months with me

## 2020-02-10 ENCOUNTER — Telehealth (INDEPENDENT_AMBULATORY_CARE_PROVIDER_SITE_OTHER): Payer: Medicare Other | Admitting: Clinical

## 2020-02-10 ENCOUNTER — Other Ambulatory Visit: Payer: Self-pay

## 2020-02-10 DIAGNOSIS — F4310 Post-traumatic stress disorder, unspecified: Secondary | ICD-10-CM

## 2020-02-10 NOTE — Progress Notes (Signed)
Virtual Visit via Video Note  I connected with Laura [name] on 02/10/20 at 11:00 AM EDT by a video enabled telemedicine application and verified that I am speaking with the correct person using two identifiers.  Location: Patient: Home Provider: Office   I discussed the limitations of evaluation and management by telemedicine and the availability of in person appointments. The patient expressed understanding and agreed to proceed.      THERAPIST PROGRESS NOTE  Session Time: 11:00AM-11:45AM  Participation Level: Active  Behavioral Response: CasualAlertDepressed  Type of Therapy: Individual Therapy  Treatment Goals addressed: Coping  Interventions: CBT, DBT, Solution Focused, Strength-based and Supportive  Summary: [name] is a 59 y.o. female who presents with PTSD.The OPT therapist worked with thepatientfor herongoing OPT treatmentsession. The OPT therapist utilized Motivational Interviewing to assist in creating therapeutic repore. The patient in the session was engaged and work in Science writer about hertriggers and symptoms over the past few weeksincluding her visiting sister from [place] having a fall in the home and breaking her back and the patient having to fill in as a caregiver and the stress created being a trigger for the patient who has been feeling overwhelmed. The OPT therapist utilized Cognitive Behavioral Therapy through cognitive restructuring as well as worked with the patient on coping strategies to assist in management ofmental health symptoms as well as work to challenge current negative automatic thoughts and improve the patients thought processes.   Suicidal/Homicidal: Nowithout intent/plan  Therapist Response:The OPT therapist worked with the patient for the patients scheduled session. The patient was engaged in hersession and gave feedback in relation to triggers, symptoms, and behavior responses over the pastfewweeks. The  OPT therapist worked with the patient utilizing an in session Cognitive Behavioral Therapy exercise. The patient was responsive in the session and verbalized, " I have been trying to find a care placement for my elderly sister who fell and broke her back, I feel like the family is blaming me over calling the care facilities".The OPT therapist continued in this session to work with the patient on challenging her negative automatic thoughtsl.The OPT therapist acknowledged the patients hardship in unexpectedly having to jump in as a caregiver to her sibling. The OPT therapist will continue treatment work with the patient in hernext scheduled session  Plan: Return again in 2/3 weeks.  Diagnosis: Axis I:Post Traumatic Stress Disorder    Axis II: No diagnosis  I discussed the assessment and treatment plan with the patient. The patient was provided an opportunity to ask questions and all were answered. The patient agreed with the plan and demonstrated an understanding of the instructions.   The patient was advised to call back or seek an in-person evaluation if the symptoms worsen or if the condition fails to improve as anticipated.  I provided 45 minutes of non-face-to-face time during this encounter.  Lennox Grumbles, LCSW 02/10/2020

## 2020-02-12 ENCOUNTER — Ambulatory Visit: Payer: Self-pay

## 2020-02-14 ENCOUNTER — Ambulatory Visit: Payer: Self-pay
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
## 2020-02-19 DIAGNOSIS — I482 Chronic atrial fibrillation, unspecified: Secondary | ICD-10-CM | POA: Diagnosis not present

## 2020-02-19 DIAGNOSIS — I10 Essential (primary) hypertension: Secondary | ICD-10-CM | POA: Diagnosis not present

## 2020-02-19 DIAGNOSIS — F0630 Mood disorder due to known physiological condition, unspecified: Secondary | ICD-10-CM | POA: Diagnosis not present

## 2020-02-19 DIAGNOSIS — R944 Abnormal results of kidney function studies: Secondary | ICD-10-CM | POA: Diagnosis not present

## 2020-02-19 DIAGNOSIS — G250 Essential tremor: Secondary | ICD-10-CM | POA: Diagnosis not present

## 2020-02-19 DIAGNOSIS — E1142 Type 2 diabetes mellitus with diabetic polyneuropathy: Secondary | ICD-10-CM | POA: Diagnosis not present

## 2020-02-19 DIAGNOSIS — E782 Mixed hyperlipidemia: Secondary | ICD-10-CM | POA: Diagnosis not present

## 2020-02-19 DIAGNOSIS — D801 Nonfamilial hypogammaglobulinemia: Secondary | ICD-10-CM | POA: Diagnosis not present

## 2020-02-19 DIAGNOSIS — F4310 Post-traumatic stress disorder, unspecified: Secondary | ICD-10-CM | POA: Diagnosis not present

## 2020-02-19 DIAGNOSIS — I5032 Chronic diastolic (congestive) heart failure: Secondary | ICD-10-CM | POA: Diagnosis not present

## 2020-02-19 DIAGNOSIS — J4550 Severe persistent asthma, uncomplicated: Secondary | ICD-10-CM | POA: Diagnosis not present

## 2020-02-19 DIAGNOSIS — E039 Hypothyroidism, unspecified: Secondary | ICD-10-CM | POA: Diagnosis not present

## 2020-02-20 ENCOUNTER — Ambulatory Visit (HOSPITAL_COMMUNITY): Payer: Medicare Other | Admitting: Psychiatry

## 2020-02-21 ENCOUNTER — Ambulatory Visit (INDEPENDENT_AMBULATORY_CARE_PROVIDER_SITE_OTHER): Payer: Medicare Other | Admitting: Allergy & Immunology

## 2020-02-21 ENCOUNTER — Encounter: Payer: Self-pay | Admitting: Allergy & Immunology

## 2020-02-21 ENCOUNTER — Other Ambulatory Visit: Payer: Self-pay

## 2020-02-21 ENCOUNTER — Ambulatory Visit: Payer: Self-pay

## 2020-02-21 ENCOUNTER — Telehealth (INDEPENDENT_AMBULATORY_CARE_PROVIDER_SITE_OTHER): Payer: Medicare Other | Admitting: Psychiatry

## 2020-02-21 ENCOUNTER — Encounter (HOSPITAL_COMMUNITY): Payer: Self-pay | Admitting: Psychiatry

## 2020-02-21 VITALS — BP 132/80 | HR 79 | Temp 98.7°F | Resp 17 | Wt 236.4 lb

## 2020-02-21 DIAGNOSIS — F0630 Mood disorder due to known physiological condition, unspecified: Secondary | ICD-10-CM

## 2020-02-21 DIAGNOSIS — F4310 Post-traumatic stress disorder, unspecified: Secondary | ICD-10-CM | POA: Diagnosis not present

## 2020-02-21 DIAGNOSIS — J310 Chronic rhinitis: Secondary | ICD-10-CM

## 2020-02-21 DIAGNOSIS — D839 Common variable immunodeficiency, unspecified: Secondary | ICD-10-CM | POA: Diagnosis not present

## 2020-02-21 DIAGNOSIS — R918 Other nonspecific abnormal finding of lung field: Secondary | ICD-10-CM | POA: Diagnosis not present

## 2020-02-21 DIAGNOSIS — J4550 Severe persistent asthma, uncomplicated: Secondary | ICD-10-CM

## 2020-02-21 DIAGNOSIS — I480 Paroxysmal atrial fibrillation: Secondary | ICD-10-CM | POA: Diagnosis not present

## 2020-02-21 MED ORDER — DIVALPROEX SODIUM ER 500 MG PO TB24: 1000.0000 mg | ORAL_TABLET | Freq: Two times a day (BID) | ORAL | 0 refills | Status: DC

## 2020-02-21 MED ORDER — ALBUTEROL SULFATE HFA 108 (90 BASE) MCG/ACT IN AERS: INHALATION_SPRAY | RESPIRATORY_TRACT | 1 refills | Status: DC

## 2020-02-21 MED ORDER — SPIRIVA RESPIMAT 1.25 MCG/ACT IN AERS: 2.0000 | INHALATION_SPRAY | Freq: Every day | RESPIRATORY_TRACT | 5 refills | Status: DC

## 2020-02-21 MED ORDER — MOMETASONE FURO-FORMOTEROL FUM 200-5 MCG/ACT IN AERO: 2.0000 | INHALATION_SPRAY | Freq: Two times a day (BID) | RESPIRATORY_TRACT | 5 refills | Status: DC

## 2020-02-21 NOTE — Patient Instructions (Signed)
1.DecreaseDepakote1000 mg twice a day  2. Next appointment: 7/30 at 10 AM

## 2020-02-21 NOTE — Progress Notes (Signed)
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 Bronson Ing  Tremor - thought to be related to a medication side effect  Congestive heart failure- seesDr. Bronson Ing  Plan/Recommendations:   1. Hypogammaglobulinemia - Continue with Cuvitru at the same dose. - Do consider getting the vaccine in the future (go for one of the mRNA vaccines such as Dance movement psychotherapist).  - IgG subclasses looked excellent.   2. Chronic rhinitis   - Continue with nasal saline rinses as needed.   3. Severe persistent asthma, uncomplicated - Lung function have continued to improve since being on the [place].  - We are certainly headed in the right direction.  - I am so proud of the progress you have made.  - Daily controller medication(s): Dulera 200/65mcg two puffs twice daily with spacer and Spiriva 1.27mcg two puffs once daily - Prior to physical activity: ProAir 2 puffs 10-15 minutes before physical activity. - Rescue medications: ProAir 4 puffs every 4-6 hours as needed, albuterol nebulizer one vial every 4-6 hours as needed or DuoNeb nebulizer one vial every 4-6 hours as needed - Asthma control goals:  * Full participation in all desired activities (may need albuterol before activity) * Albuterol use two time or less a week on average (not counting use with activity) * Cough interfering with sleep two time or less a month * Oral steroids no more than once a year * No hospitalizations.  4. Return in about 4  months (around 06/23/2020). This can be an in-person, a virtual Webex or a telephone follow up visit.   Subjective:   Gustava Gholar is a 59 y.o. female presenting today for follow up of  Chief Complaint  Patient presents with  . Asthma    Renada Roeper has a history of the following: Patient Active Problem List   Diagnosis Date Noted  . Abnormal EKG   . Chronic diastolic (congestive) heart failure ([place]) 08/29/2019  . Shortness of breath 08/29/2019  . Bipolar disorder ([place]) 08/10/2018  . Headache 08/10/2018  . H/O atrial flutter 08/10/2018  . OSA on CPAP 08/10/2018  . Anxiety 08/10/2018  . Uncontrolled type 2 diabetes mellitus with hyperglycemia ([place]) 03/13/2018  . Hypothyroidism 03/13/2018  . Essential hypertension, benign 03/13/2018  . Mixed hyperlipidemia 03/13/2018  . Hypogammaglobulinemia ([place]) 02/06/2018  . Non-allergic rhinitis 02/06/2018  . Severe persistent asthma, uncomplicated [id number]  . Pulmonary nodules 02/06/2018  . Bronchiectasis without complication ([place]) [id number]  . DM type 2 causing vascular disease ([place]) 02/06/2018    History obtained from: chart review and patient.  Doloris is a 59 y.o. female presenting for a follow up visit.  She is well-known to this practice.  She has a history of hypogammaglobulinemia.  At the last visit, we continued with Cuvitru at the same dose.  I did talk about getting the COVID-19 vaccine with her extensively and felt that she needed to go for one of the mRNA vaccinations.  Her most recent IgG levels looked great.  For her chronic rhinitis, we continue with nasal saline rinses.  Her lung function continued to improve and her pulse ox  was 97% today.  We continue Dulera 200/5 mcg 2 puffs twice daily as well as Spiriva 1.25 mcg 2 puffs once daily.  We got her approved again for [facility] and she has since restarted that.  Since last visit, she has done very well.  She reports that she has lost almost 30 pounds this calendar year.   She apparently is making this concoction that includes apple cider vinegar, lemons, cinnamon, ginger, and a few other odds and ends.  She drinks this twice a day and has been watching her portion control and eating with more intention, which is all helped.  She feels very energetic today.  This is fortunate since she has been taking care of her sister, who is visiting from [place] and broke her back while she was here.  Therefore her 1 week stay is extended until 5 to 6 weeks.  Asthma/Respiratory Symptom History: She remains on the [facility] 2 puffs in the morning and 2 puffs at night.  This is controlling her symptoms well.  She is on the Spiriva as well.  Her energy and shortness of breath has all but disappeared.  She does feel like she can do more during the day.  At one point, she was having difficulty performing her activities of daily living completely.  Fasenra injections are going well.  She has had no adverse reactions. Her pulmonologist apparently gave her a short burst of prednisone at one point.   Allergic Rhinitis Symptom History: Her nonallergic rhinitis has not been an issue.  She continues with nasal saline rinses as needed.   Recurrent Infections Symptom History: Infusions are going well.  She has not needed antibiotics in quite some time.   She has been working to lose weight. She has been doing her own juicing. She has a history of eating disorders and her weight has been fluctuating up and down over time.   Otherwise, there have been no changes to her past medical history, surgical history, family history, or social history.    Review of Systems  Constitutional: Negative.  Negative for chills, fever, malaise/fatigue and weight loss.  HENT: Negative.  Negative for congestion, ear discharge, ear pain, nosebleeds, sinus pain and sore throat.   Eyes: Negative for pain, discharge and redness.  Respiratory: Negative for cough, sputum production, shortness of breath and wheezing.    Cardiovascular: Negative.  Negative for chest pain and palpitations.  Gastrointestinal: Negative for abdominal pain, constipation, diarrhea, heartburn, nausea and vomiting.  Skin: Negative.  Negative for itching and rash.  Neurological: Negative for dizziness and headaches.  Endo/Heme/Allergies: Negative for environmental allergies. Does not bruise/bleed easily.       Objective:   Blood pressure 132/80, pulse 79, temperature 98.7 F (37.1 C), temperature source Temporal, resp. rate 17, weight 236 lb 6.4 oz (107.2 kg), SpO2 97 %. Body mass index is 40.58 kg/m.   Physical Exam:  Physical Exam  Constitutional: She appears well-developed.  HENT:  Head: Normocephalic and atraumatic.  Right Ear: Tympanic membrane, external ear and ear canal normal. No drainage, swelling or tenderness. Tympanic membrane is not injected, not scarred, not erythematous, not retracted and not bulging.  Left Ear: Tympanic membrane, external ear and ear canal normal. No drainage, swelling or tenderness. Tympanic membrane is not injected, not scarred, not erythematous, not retracted and not bulging.  Nose: Mucosal edema and rhinorrhea present. No nasal deformity or septal deviation. No epistaxis. Right sinus exhibits no maxillary sinus tenderness and no frontal sinus tenderness. Left sinus  exhibits no maxillary sinus tenderness and no frontal sinus tenderness.  Mouth/Throat: Uvula is midline and oropharynx is clear and moist. Mucous membranes are not pale and not dry.  Eyes: Pupils are equal, round, and reactive to light. Conjunctivae and EOM are normal. Right eye exhibits no chemosis and no discharge. Left eye exhibits no chemosis and no discharge. Right conjunctiva is not injected. Left conjunctiva is not injected.  Cardiovascular: Normal rate, regular rhythm and normal heart sounds.  Respiratory: Effort normal and breath sounds normal. No accessory muscle usage. No tachypnea. No respiratory distress. She has no  wheezes. She has no rhonchi. She has no rales. She exhibits no tenderness.  Moving air well in all lung fields.   GI: There is no abdominal tenderness. There is no rebound and no guarding.  Lymphadenopathy:       Head (right side): No submandibular, no tonsillar and no occipital adenopathy present.       Head (left side): No submandibular, no tonsillar and no occipital adenopathy present.    She has no cervical adenopathy.  Neurological: She is alert.  Skin: No abrasion, no petechiae and no rash noted. Rash is not papular, not vesicular and not urticarial. No erythema. No pallor.  Psychiatric: She has a normal mood and affect.     Diagnostic studies:    Spirometry: results abnormal (FEV1: 1.63/62%, FVC: 2.25/67%, FEV1/FVC: 72%).    Spirometry consistent with possible restrictive disease. Values have been improving over time, albeit slowly.   Allergy Studies: none        Salvatore Marvel, MD  All[facility]

## 2020-02-21 NOTE — Patient Instructions (Addendum)
1. Hypogammaglobulinemia - Continue with Cuvitru at the same dose. - Do consider getting the vaccine in the future (go for one of the mRNA vaccines such as Dance movement psychotherapist).  - IgG subclasses looked excellent.   2. Chronic rhinitis   - Continue with nasal saline rinses as needed.   3. Severe persistent asthma, uncomplicated - Lung function have continued to improve since being on the [place].  - We are certainly headed in the right direction.  - I am so proud of the progress you have made.  - Daily controller medication(s): Dulera 200/18mcg two puffs twice daily with spacer and Spiriva 1.71mcg two puffs once daily - Prior to physical activity: ProAir 2 puffs 10-15 minutes before physical activity. - Rescue medications: ProAir 4 puffs every 4-6 hours as needed, albuterol nebulizer one vial every 4-6 hours as needed or DuoNeb nebulizer one vial every 4-6 hours as needed - Asthma control goals:  * Full participation in all desired activities (may need albuterol before activity) * Albuterol use two time or less a week on average (not counting use with activity) * Cough interfering with sleep two time or less a month * Oral steroids no more than once a year * No hospitalizations.  4. Return in about 4 months (around 06/23/2020). This can be an in-person, a virtual Webex or a telephone follow up visit.   Please inform us of any Emergency Department visits, hospitalizations, or changes in symptoms. Call us before going to the ED for breathing or allergy symptoms since we might be able to fit you in for a sick visit. Feel free to contact us anytime with any questions, problems, or concerns.  It was a pleasure to see you again today!  Websites that have reliable patient information: 1. American Academy of Asthma, Allergy, and Immunology: www.aaaai.org 2. Food Allergy Research and Education (FARE): foodallergy.org 3. Mothers of Asthmatics: http://www.asthmacommunitynetwork.org 4. American  College of Allergy, Asthma, and Immunology: www.acaai.org   COVID-19 Vaccine Information can be found at: ShippingScam.co.uk For questions related to vaccine distribution or appointments, please email vaccine@[place] .com or call [phone].     "Like" [place] on Facebook and Instagram for our latest updates!       HAPPY SPRING!  Make sure you are registered to vote! If you have moved or changed any of your contact information, you will need to get this updated before voting!  In some cases, you MAY be able to register to vote online: [email or web address]

## 2020-02-24 NOTE — Addendum Note (Signed)
Addended by: Cathi Roan on: 02/24/2020 10:48 AM   Modules accepted: Orders

## 2020-02-25 ENCOUNTER — Telehealth: Payer: Self-pay | Admitting: *Deleted

## 2020-02-25 MED ORDER — BUDESONIDE-FORMOTEROL FUMARATE 160-4.5 MCG/ACT IN AERO: 2.0000 | INHALATION_SPRAY | Freq: Two times a day (BID) | RESPIRATORY_TRACT | 5 refills | Status: DC

## 2020-02-25 NOTE — Telephone Encounter (Signed)
G Symbicort 160/4.5 mcg 2 puffs twice daily.  Salvatore Marvel, MD [facility] [place]

## 2020-02-25 NOTE — Telephone Encounter (Signed)
PA has been submitted for [facility] through CoverMyMeds and is currently pending approval or denial. If denied preferred alternatives are Breo, Fluticasone Propionate/Salmeterol, Pulmicort Flexhaler, and Symbicort. Patient's Medicare Rx information is  ID: [id number] BIN: [id number] GRP: [id number] PCN: [id number]

## 2020-02-25 NOTE — Telephone Encounter (Signed)
Called patient and left a voicemail asking to return call to discuss change in inhaler.

## 2020-02-25 NOTE — Addendum Note (Signed)
Addended by: Chip Boer R on: 02/25/2020 01:38 PM   Modules accepted: Orders

## 2020-02-25 NOTE — Telephone Encounter (Signed)
New prescription has been sent in. Waiting to see if there is any fax back from the pharmacy before advising patient.

## 2020-02-25 NOTE — Telephone Encounter (Signed)
PA was denied. Preferred alternatives are Breo, Fluticasone Propionate/Salmeterol, Pulmicort Flexhaler, and Symbicort. Please advise.

## 2020-03-04 ENCOUNTER — Other Ambulatory Visit: Payer: Self-pay

## 2020-03-04 ENCOUNTER — Ambulatory Visit (INDEPENDENT_AMBULATORY_CARE_PROVIDER_SITE_OTHER): Payer: Medicare Other | Admitting: Clinical

## 2020-03-04 DIAGNOSIS — F4310 Post-traumatic stress disorder, unspecified: Secondary | ICD-10-CM

## 2020-03-04 NOTE — Progress Notes (Signed)
  Virtual Visit via Video Note  I connected withTamara [name] on 03/04/20 at 10:00 AM EDT by a video enabled telemedicine application and verified that I am speaking with the correct person using two identifiers.  Location: Patient: Home Provider: Office  I discussed the limitations of evaluation and management by telemedicine and the availability of in person appointments. The patient expressed understanding and agreed to proceed.      THERAPIST PROGRESS NOTE  Session Time: 10:00AM-10:45AM  Participation Level: Active  Behavioral Response: CasualAlertDepressed  Type of Therapy: Individual Therapy  Treatment Goals addressed: Coping  Interventions: CBT, DBT, Solution Focused, Strength-based and Supportive  Summary: [name] is a 59 y.o. female who presents with PTSD.The OPT therapist worked with thepatientfor herongoing OPT treatmentsession. The OPT therapist utilized Motivational Interviewing to assist in creating therapeutic repore. The patient in the session was engaged and work in Science writer about hertriggers and symptoms over the past few weeksincluding her time over the past few weeks caregiving for her injured sister who has now gone to live with another family sibling. The OPT therapist utilized Cognitive Behavioral Therapy through cognitive restructuring as well as worked with the patient on coping strategies to assist in management ofmental health symptoms as well as work to challenge current negative automatic thoughts and improve the patients thought processes.   Suicidal/Homicidal: Nowithout intent/plan  Therapist Response:The OPT therapist worked with the patient for the patients scheduled session. The patient was engaged in hersession and gave feedback in relation to triggers, symptoms, and behavior responses over the pastfewweeks. The OPT therapist worked with the patient utilizing an in session Cognitive Behavioral  Therapy exercise. The patient was responsive in the session and verbalized, " I have been doing well now that I dont have the responsibility and caregiving, I have continued to lose some weight, be more active, and I feel more motivated and happy than I have been in a long time".The OPT therapistcontinued in this session towork with the patient on challenging her negative automatic thoughts, continue to work on positive thinking, as well as self esteem and confidence.The OPT therapist will continue treatment work with the patient in hernext scheduled session  Plan: Return again in 2/3 weeks.  Diagnosis:      Axis I:Post Traumatic Stress Disorder                          Axis II: No diagnosis  I discussed the assessment and treatment plan with the patient. The patient was provided an opportunity to ask questions and all were answered. The patient agreed with the plan and demonstrated an understanding of the instructions.  The patient was advised to call back or seek an in-person evaluation if the symptoms worsen or if the condition fails to improve as anticipated.  I provided 45 minutes of non-face-to-face time during this encounter.  Lennox Grumbles, LCSW 03/04/2020

## 2020-03-05 ENCOUNTER — Telehealth: Payer: Self-pay | Admitting: Neurology

## 2020-03-05 NOTE — Telephone Encounter (Signed)
Pt is a returning pt of Dr. Tori Milks Pt is scheduled on June 22 with Dr. Rexene Alberts for a movement disorder. We Received a Referral from Dr. Allyn Kenner stating that the Patient was supposed to be referred to Dr. Krista Blue per Dr. Rexene Alberts.   Please advise if patient is supposed to switch providers and if the Switch is ok?   Thank you

## 2020-03-05 NOTE — Telephone Encounter (Signed)
Please refer to my note from March for reference.  I did not suggest that patient see Dr. Krista Blue.  I recommended she talk with her primary care nurse practitioner or physician about a referral to another movement disorder specialist, since I did not have much to offer her.  Unfortunately, I still do not have much to offer her and we had a lengthy discussion last time.  I would be fine with Dr. Krista Blue seeing her, but that recommendation may be from PCP.

## 2020-03-05 NOTE — Telephone Encounter (Signed)
Chart reviewed, patient has been seen by movement disorder fellowship trained by [facility] since Sept 2019, last visit was on January 06 2020.  I do not think I can have any more to offer.

## 2020-03-10 NOTE — Telephone Encounter (Signed)
Called pt and relayed message below.

## 2020-03-18 ENCOUNTER — Ambulatory Visit (HOSPITAL_COMMUNITY): Admission: RE | Admit: 2020-03-18 | Payer: Medicare Other | Source: Ambulatory Visit

## 2020-03-23 ENCOUNTER — Ambulatory Visit (INDEPENDENT_AMBULATORY_CARE_PROVIDER_SITE_OTHER): Payer: Medicare Other | Admitting: Clinical

## 2020-03-23 ENCOUNTER — Other Ambulatory Visit: Payer: Self-pay

## 2020-03-23 DIAGNOSIS — F4310 Post-traumatic stress disorder, unspecified: Secondary | ICD-10-CM

## 2020-03-23 NOTE — Progress Notes (Signed)
  Virtual Visit via Video Note  I connected withTamara [name] 06/14/21at 10:00 AM EDTby a video enabled telemedicine application and verified that I am speaking with the correct person using two identifiers.  Location: Patient:Home Provider:Office  I discussed the limitations of evaluation and management by telemedicine and the availability of in person appointments. The patient expressed understanding and agreed to proceed.     THERAPIST PROGRESS NOTE  Session Time:10:10AM-10:45AM  Participation Level:Active  Behavioral Response:CasualAlertDepressed  Type of Therapy:Individual Therapy  Treatment Goals addressed:Coping  Interventions:CBT, DBT, Solution Focused, Strength-based and Supportive  Summary:Laura [name] a 59 y.o.femalewho presents with PTSD.The OPT therapist worked with thepatientfor herongoing OPT treatmentsession. The OPT therapist utilized Motivational Interviewing to assist in creating therapeutic repore. The patient in the session was engaged and work in Science writer about hertriggers and symptoms over the past few weeksincluding her work to build self esteem and confidence, increase socialization, and be more active.The OPT therapist utilized Cognitive Behavioral Therapy through cognitive restructuring as well as worked with the patient on coping strategies to assist in management ofmental healthsymptoms as well as work tochallenge current negative automatic thoughts and improve the patients thought processes.The OPT therapist continued to give support around being active, social, and positive. The patient has made gains since her last session.  Suicidal/Homicidal:Nowithout intent/plan  Therapist Response:The OPT therapist worked with the patient for the patients scheduled session. The patient was engaged in hersession and gave feedback in relation to triggers, symptoms, and behavior responses over the  pastfewweeks. The OPT therapist worked with the patient utilizing an in session Cognitive Behavioral Therapy exercise. The patient was responsive in the session and verbalized, " Laura [name] recently got back from a vacation and I had fun I danced and was social I had others take notice of me and give complements".The OPT therapistcontinued in this session towork with the patient on challenging her negative automatic thoughts, continue to work on positive thinking, as well as self esteem and confidence.The patient reviewed her plans and calander dates to go and visit family members as well as ongoing plans to go to festivals and camping. The OPT therapist will continue treatment work with the patient in hernext scheduled session  Plan: Return again in2/3weeks.  Diagnosis:Axis I:Post Traumatic Stress Disorder  Axis II:No diagnosis  I discussed the assessment and treatment plan with the patient. The patient was provided an opportunity to ask questions and all were answered. The patient agreed with the plan and demonstrated an understanding of the instructions.  The patient was advised to call back or seek an in-person evaluation if the symptoms worsen or if the condition fails to improve as anticipated.  I provided24minutes of non-face-to-face time during this encounter.  Lennox Grumbles, LCSW  03/23/2020

## 2020-03-25 ENCOUNTER — Ambulatory Visit (HOSPITAL_COMMUNITY)
Admission: RE | Admit: 2020-03-25 | Discharge: 2020-03-25 | Disposition: A | Discharge status: Home / Self Care | Payer: Medicare Other | Source: Ambulatory Visit | Attending: Pulmonary Disease | Admitting: Pulmonary Disease

## 2020-03-25 ENCOUNTER — Other Ambulatory Visit: Payer: Self-pay

## 2020-03-25 DIAGNOSIS — R918 Other nonspecific abnormal finding of lung field: Secondary | ICD-10-CM | POA: Diagnosis not present

## 2020-03-25 IMAGING — CT CT CHEST W/O CM
2 of 4 series · 15 of 36 positions shown, 18 images · non-contrast
Comparison: Chest CT [DATE].

CLINICAL DATA: 59-year-old female with history of pulmonary nodule.

EXAM:
CT CHEST WITHOUT CONTRAST
TECHNIQUE: Multidetector CT imaging of the chest was performed following the
standard protocol without IV contrast.

[Series 2: routine chest without · axial · non-contrast · 0.68mm/px · z∈[-373,-119]mm · 12 of 149 slices shown, 15 images]
[im 11/149  mediastinal]
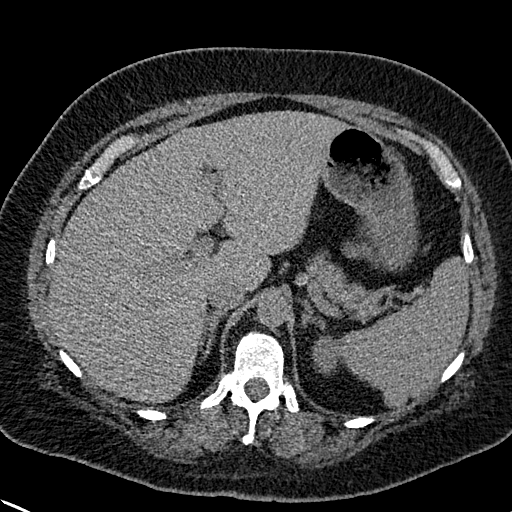
[im 11/149  lung]
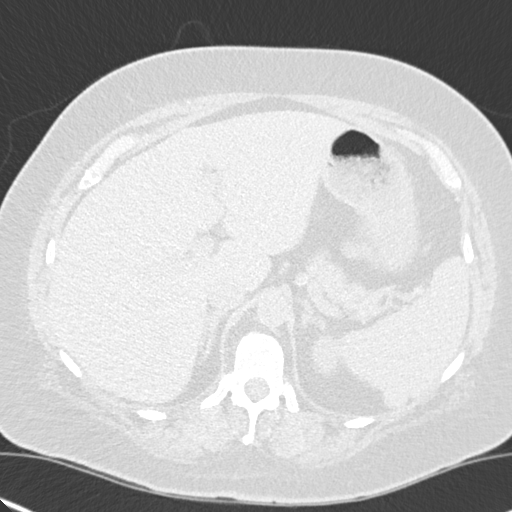
[im 22/149  lung]
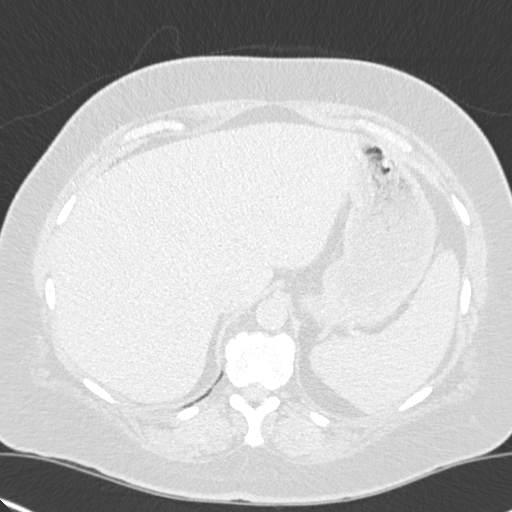
[im 32/149  lung]
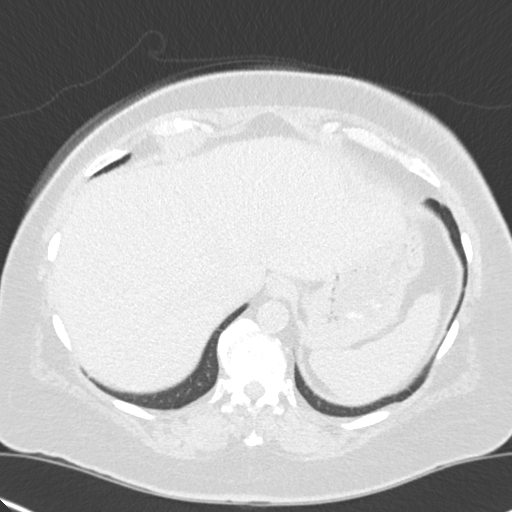
[im 43/149  lung]
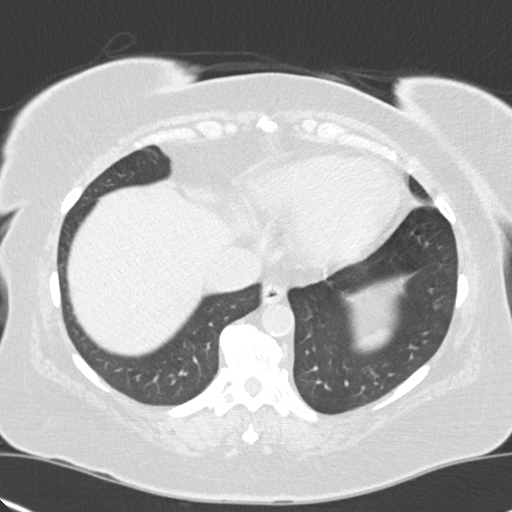
[im 53/149  mediastinal]
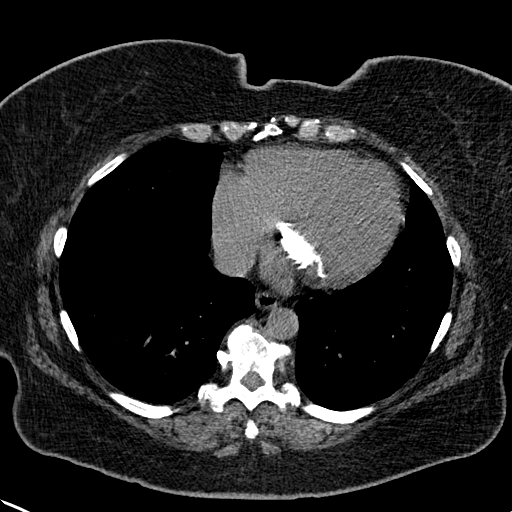
[im 53/149  lung]
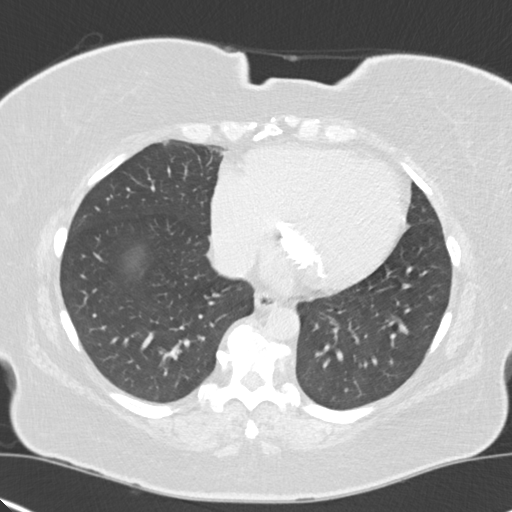
[im 64/149  lung]
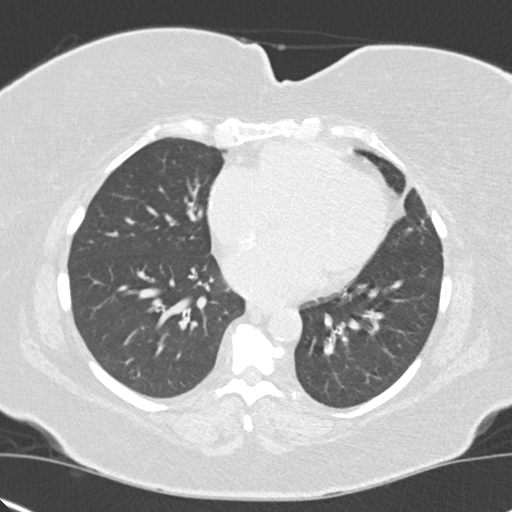
[im 85/149  lung]
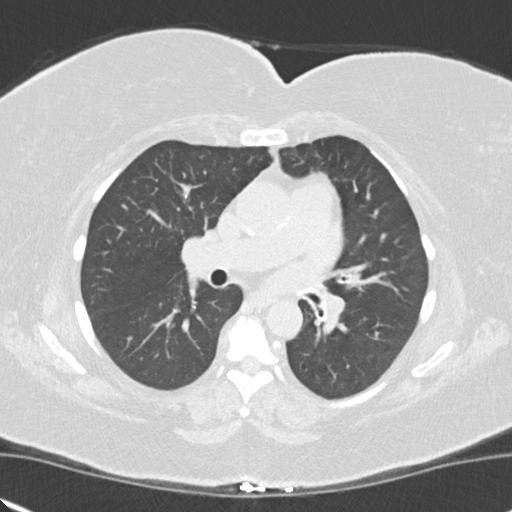
[im 96/149  lung]
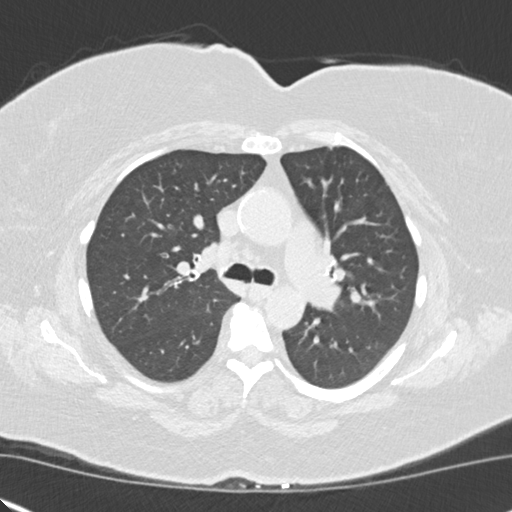
[im 106/149  mediastinal]
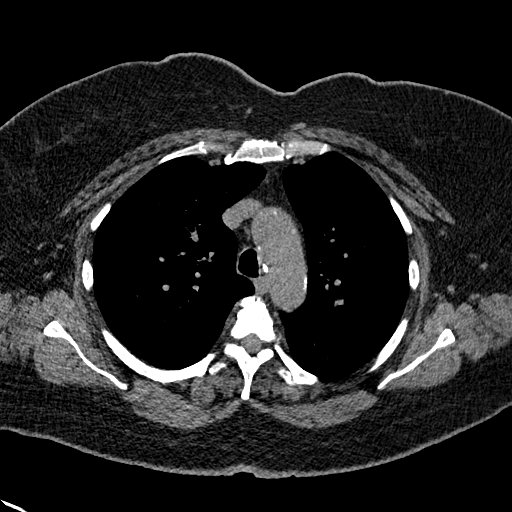
[im 106/149  lung]
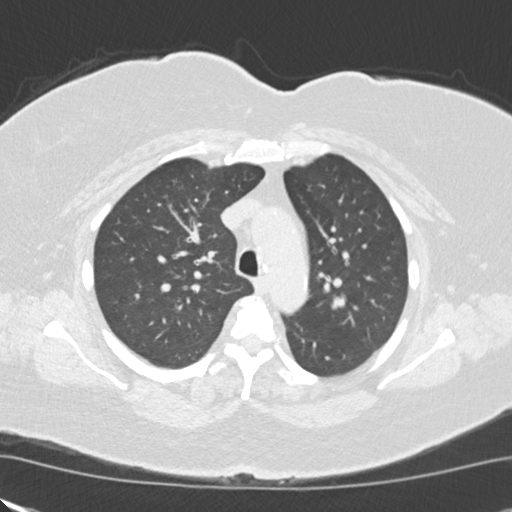
[im 117/149  lung]
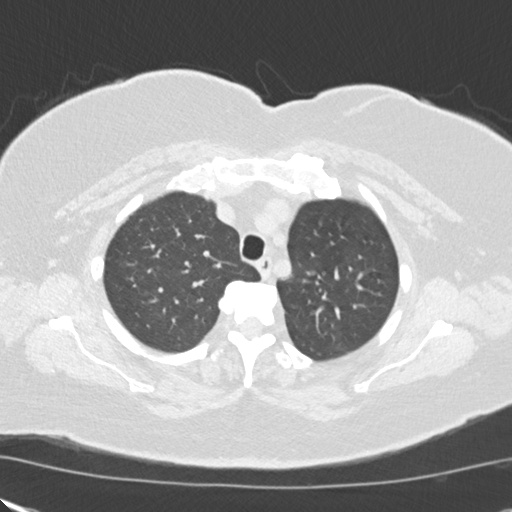
[im 127/149  lung]
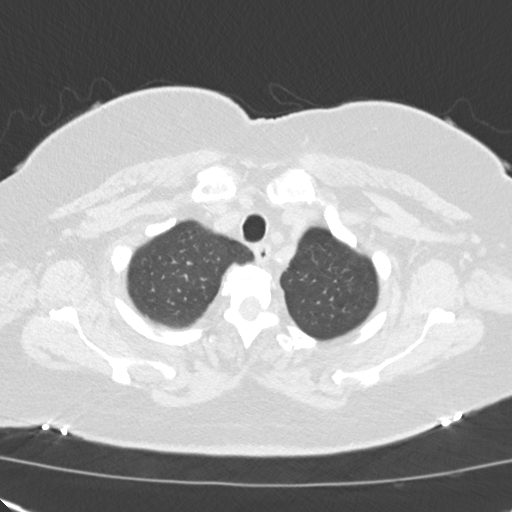
[im 138/149  lung]
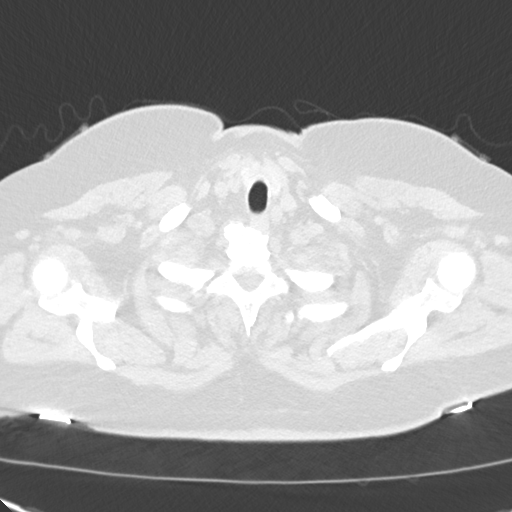

[Series 5: coronal · coronal · 0.59mm/px · 3 of 126 slices shown]
[im 26/126  lung]
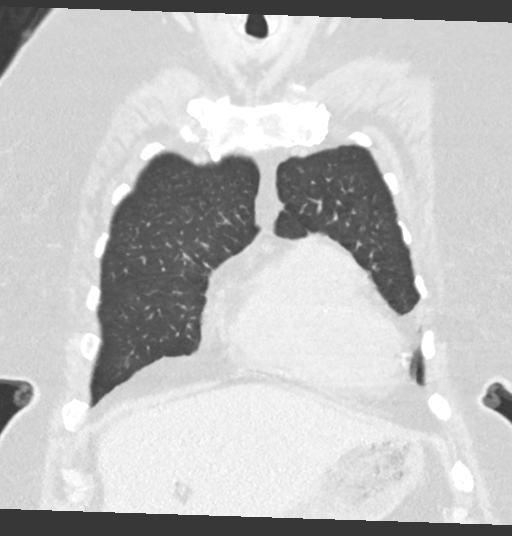
[im 51/126  lung]
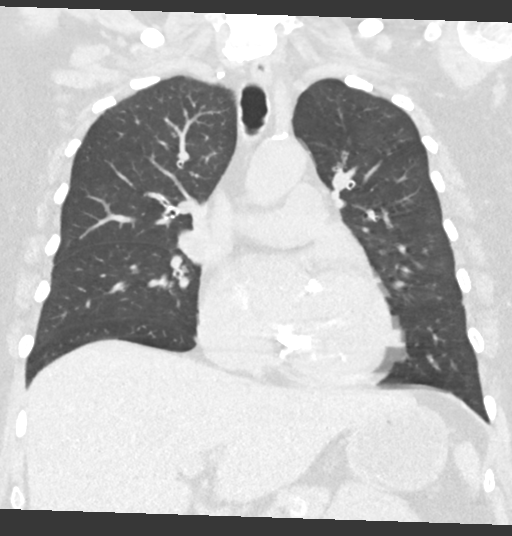
[im 76/126  lung]
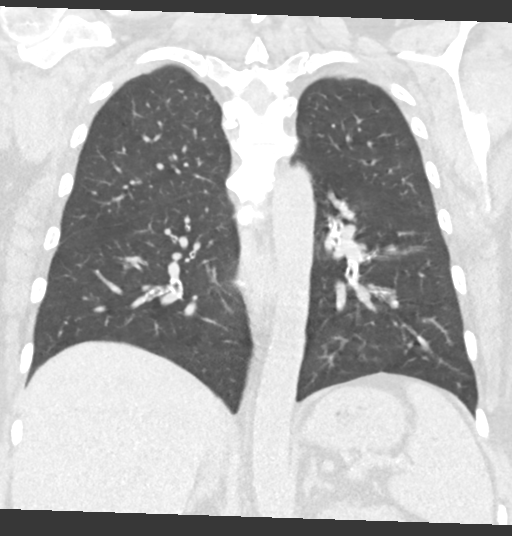

[15 of 36 positions shown; findings below may reference images not displayed]

FINDINGS: Cardiovascular: Heart size is normal. Small amount of pericardial
calcification. No pericardial fluid. Aortic atherosclerosis. No
coronary artery calcifications. Severe calcifications of the mitral
annulus.

Mediastinum/Nodes: No pathologically enlarged mediastinal or hilar
lymph nodes. Please note that accurate exclusion of hilar adenopathy
is limited on noncontrast CT scans. Esophagus is unremarkable in
appearance. No axillary lymphadenopathy.

Lungs/Pleura: Previously noted nodule of concern in the base of the
right lower lobe has completely resolved. No other suspicious
appearing pulmonary nodules or masses are noted. No acute
consolidative airspace disease. No pleural effusions.

Upper Abdomen: Aortic atherosclerosis.

Musculoskeletal: There are no aggressive appearing lytic or blastic
lesions noted in the visualized portions of the skeleton.
IMPRESSION: 1. No suspicious appearing pulmonary nodules or masses are noted.
2. Pericardial calcification. Outpatient evaluation with
echocardiography could be considered to exclude the possibility of
constrictive physiology if clinically appropriate.
3. Aortic atherosclerosis.

Aortic Atherosclerosis ([H6]-[H6]).

## 2020-03-31 ENCOUNTER — Institutional Professional Consult (permissible substitution): Payer: Medicare Other | Admitting: Neurology

## 2020-04-14 ENCOUNTER — Other Ambulatory Visit: Payer: Self-pay | Admitting: Allergy & Immunology

## 2020-04-15 ENCOUNTER — Other Ambulatory Visit: Payer: Self-pay

## 2020-04-15 ENCOUNTER — Other Ambulatory Visit: Payer: Self-pay | Admitting: Allergy & Immunology

## 2020-04-15 ENCOUNTER — Ambulatory Visit (HOSPITAL_COMMUNITY): Payer: Medicare Other | Admitting: Clinical

## 2020-04-15 ENCOUNTER — Telehealth (HOSPITAL_COMMUNITY): Payer: Self-pay | Admitting: Clinical

## 2020-04-15 NOTE — Telephone Encounter (Signed)
The OPT therapist sent text to session x2 at 10:00AM and 10:10AM, however, the patient did not respond missing their scheduled session

## 2020-04-17 ENCOUNTER — Ambulatory Visit: Payer: Self-pay

## 2020-04-23 ENCOUNTER — Ambulatory Visit (INDEPENDENT_AMBULATORY_CARE_PROVIDER_SITE_OTHER): Payer: Medicare Other | Admitting: Clinical

## 2020-04-23 ENCOUNTER — Other Ambulatory Visit: Payer: Self-pay

## 2020-04-23 DIAGNOSIS — F4310 Post-traumatic stress disorder, unspecified: Secondary | ICD-10-CM

## 2020-04-23 NOTE — Progress Notes (Signed)
   Virtual Visit via Video Note  I connected withTamara [name] 07/15/21at 11:00 AM EDTby a video enabled telemedicine application and verified that I am speaking with the correct person using two identifiers.  Location: Patient:Home Provider:Office  I discussed the limitations of evaluation and management by telemedicine and the availability of in person appointments. The patient expressed understanding and agreed to proceed.     THERAPIST PROGRESS NOTE  Session Time:11:00AM-11:45AM  Participation Level:Active  Behavioral Response:CasualAlertDepressed  Type of Therapy:Individual Therapy  Treatment Goals addressed:Coping  Interventions:CBT, DBT, Solution Focused, Strength-based and Supportive  Summary:Laura [name] a 59 y.o.femalewho presents with PTSD.The OPT therapist worked with thepatientfor herongoing OPT treatmentsession. The OPT therapist utilized Motivational Interviewing to assist in creating therapeutic repore. The patient in the session was engaged and work in Science writer about hertriggers and symptoms over the past few weeksincludingher adjustment in her vacation while she is out of town visiting with family.The OPT therapist utilized Cognitive Behavioral Therapy through cognitive restructuring as well as worked with the patient on coping strategies to assist in management ofmental healthsymptoms as well as ongoing work tochallenge current negative automatic thoughts and improve the patients thought processes.The OPT therapist continued to give support around being active, social, and positive and gave the patient feedback around noticeable gains and presentation of more elated mood over the past few sessions. The OPT therapist worked with the patient reviewing her Treatment Plan in this session.   Suicidal/Homicidal:Nowithout intent/plan  Therapist Response:The OPT therapist worked with the patient for  the patients scheduled session. The patient was engaged in hersession and gave feedback in relation to triggers, symptoms, and behavior responses over the pastfewweeks as well as her adjustment in staying with family during her current out of town vacation. The OPT therapist worked with the patient utilizing an in session Cognitive Behavioral Therapy exercise. The patient was responsive in the session and verbalized, " Iam doing well here and the best part is getting time in with my grandchildren, I have been working on my interactions and not letting others negatively impact me".The OPT therapistcontinued in this session towork with the patient on challenging her negative automatic thoughts, continue to work on positive thinking, as well as self esteem and confidence.The patient overviewed her calander dates to go and visit family members as well as ongoing plans to go to festivals and camping. The patient identified current conflict with her daughter and worked with the [place] therapist in the session on managing this dynamic.The OPT therapist will continue treatment work with the patient in hernext scheduled session  Plan: Return again in2/3weeks.  Diagnosis:Axis I:Post Traumatic Stress Disorder  Axis II:No diagnosis  I discussed the assessment and treatment plan with the patient. The patient was provided an opportunity to ask questions and all were answered. The patient agreed with the plan and demonstrated an understanding of the instructions.  The patient was advised to call back or seek an in-person evaluation if the symptoms worsen or if the condition fails to improve as anticipated.  I provided34minutes of non-face-to-face time during this encounter.  Lennox Grumbles, LCSW 04/23/2020

## 2020-04-28 ENCOUNTER — Other Ambulatory Visit: Payer: Self-pay | Admitting: Allergy & Immunology
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
## 2020-05-08 ENCOUNTER — Encounter (HOSPITAL_COMMUNITY): Payer: Self-pay | Admitting: Psychiatry

## 2020-05-08 ENCOUNTER — Telehealth (INDEPENDENT_AMBULATORY_CARE_PROVIDER_SITE_OTHER): Payer: Medicare Other | Admitting: Psychiatry

## 2020-05-08 ENCOUNTER — Other Ambulatory Visit: Payer: Self-pay

## 2020-05-08 DIAGNOSIS — F3341 Major depressive disorder, recurrent, in partial remission: Secondary | ICD-10-CM

## 2020-05-08 MED ORDER — DIVALPROEX SODIUM ER 500 MG PO TB24: ORAL_TABLET | ORAL | 0 refills | Status: DC

## 2020-05-08 NOTE — Patient Instructions (Signed)
1.DecreaseDepakote500 mg in AM, 1000 mg at night 2. Next appointment: 10/22 at 10 AM

## 2020-05-22 DIAGNOSIS — I10 Essential (primary) hypertension: Secondary | ICD-10-CM | POA: Diagnosis not present

## 2020-05-22 DIAGNOSIS — F319 Bipolar disorder, unspecified: Secondary | ICD-10-CM | POA: Diagnosis not present

## 2020-05-22 DIAGNOSIS — R0602 Shortness of breath: Secondary | ICD-10-CM | POA: Diagnosis not present

## 2020-05-22 DIAGNOSIS — F419 Anxiety disorder, unspecified: Secondary | ICD-10-CM | POA: Diagnosis not present

## 2020-05-22 DIAGNOSIS — F0630 Mood disorder due to known physiological condition, unspecified: Secondary | ICD-10-CM | POA: Diagnosis not present

## 2020-05-22 DIAGNOSIS — A084 Viral intestinal infection, unspecified: Secondary | ICD-10-CM | POA: Diagnosis not present

## 2020-05-22 DIAGNOSIS — G9009 Other idiopathic peripheral autonomic neuropathy: Secondary | ICD-10-CM | POA: Diagnosis not present

## 2020-05-22 DIAGNOSIS — I509 Heart failure, unspecified: Secondary | ICD-10-CM | POA: Diagnosis not present

## 2020-05-22 DIAGNOSIS — E1142 Type 2 diabetes mellitus with diabetic polyneuropathy: Secondary | ICD-10-CM | POA: Diagnosis not present

## 2020-05-22 DIAGNOSIS — D803 Selective deficiency of immunoglobulin G [IgG] subclasses: Secondary | ICD-10-CM | POA: Diagnosis not present

## 2020-05-22 DIAGNOSIS — I5033 Acute on chronic diastolic (congestive) heart failure: Secondary | ICD-10-CM | POA: Diagnosis not present

## 2020-05-22 DIAGNOSIS — Z6841 Body Mass Index (BMI) 40.0 and over, adult: Secondary | ICD-10-CM | POA: Diagnosis not present

## 2020-05-22 DIAGNOSIS — D801 Nonfamilial hypogammaglobulinemia: Secondary | ICD-10-CM | POA: Diagnosis not present

## 2020-05-25 ENCOUNTER — Ambulatory Visit: Payer: Medicare Other | Admitting: Cardiovascular Disease

## 2020-05-28 ENCOUNTER — Other Ambulatory Visit: Payer: Self-pay

## 2020-05-28 ENCOUNTER — Ambulatory Visit (INDEPENDENT_AMBULATORY_CARE_PROVIDER_SITE_OTHER): Payer: Medicare Other | Admitting: Clinical

## 2020-05-28 DIAGNOSIS — F4310 Post-traumatic stress disorder, unspecified: Secondary | ICD-10-CM | POA: Diagnosis not present

## 2020-05-28 NOTE — Progress Notes (Signed)
  Virtual Visit via Video Note  I connected withTamara [name] 08/19/21at 11:00 AM EDTby a video enabled telemedicine application and verified that I am speaking with the correct person using two identifiers.  Location: Patient:Home Provider:Office  I discussed the limitations of evaluation and management by telemedicine and the availability of in person appointments. The patient expressed understanding and agreed to proceed.     THERAPIST PROGRESS NOTE  Session Time:11:00AM-11:45AM  Participation Level:Active  Behavioral Response:CasualAlertDepressed  Type of Therapy:Individual Therapy  Treatment Goals addressed:Coping  Interventions:CBT, DBT, Solution Focused, Strength-based and Supportive  Summary:Laura Leppertis a 59 y.o.femalewho presents with PTSD.The OPT therapist worked with thepatientfor herongoing OPT treatmentsession. The OPT therapist utilized Motivational Interviewing to assist in creating therapeutic repore. The patient in the session was engaged and work in Science writer about hertriggers and symptoms over the past few weeksincludingherreadjustment being back at home after visiting family in [name].The OPT therapist utilized Cognitive Behavioral Therapy through cognitive restructuring as well as worked with the patient on coping strategies to assist in management ofmental healthsymptoms as well as ongoing work tochallenge current negative automatic thoughts and improve the patients thought processes.The OPT therapist continued to give support around being active, social, and positive and gave the patient feedback around noticeable gains and presentation of continiued elated mood over the past few sessions. The OPT therapist worked with the patient reviewing her Treatment Plan in this session.   Suicidal/Homicidal:Nowithout intent/plan  Therapist Response:The OPT therapist worked with the patient for  the patients scheduled session. The patient was engaged in hersession and gave feedback in relation to triggers, symptoms, and behavior responses over the pastfewweeks as well as her adjustment in being back at home after being out of state visiting family during her out of town vacation. The OPT therapist worked with the patient utilizing an in session Cognitive Behavioral Therapy exercise. The patient was responsive in the session and verbalized, " Iam doing well I actually have a lunch date today that I am both excited and nervous about".The OPT therapistcontinued in this session towork with the patient on challenging her negative automatic thoughts, continue to work on positive thinking, as well as self esteem and confidence .The OPT therapist will continue treatment work with the patient in hernext scheduled session  Plan: Return again in2/3weeks.  Diagnosis:Axis I:Post Traumatic Stress Disorder  Axis II:No diagnosis  I discussed the assessment and treatment plan with the patient. The patient was provided an opportunity to ask questions and all were answered. The patient agreed with the plan and demonstrated an understanding of the instructions.  The patient was advised to call back or seek an in-person evaluation if the symptoms worsen or if the condition fails to improve as anticipated.  I provided61minutes of non-face-to-face time during this encounter.  Lennox Grumbles, LCSW 05/28/2020

## 2020-05-29 ENCOUNTER — Encounter: Payer: Self-pay | Admitting: *Deleted

## 2020-05-29 ENCOUNTER — Ambulatory Visit (INDEPENDENT_AMBULATORY_CARE_PROVIDER_SITE_OTHER): Payer: Medicare Other | Admitting: Cardiology

## 2020-05-29 ENCOUNTER — Encounter: Payer: Self-pay | Admitting: Cardiology

## 2020-05-29 ENCOUNTER — Other Ambulatory Visit: Payer: Self-pay

## 2020-05-29 VITALS — BP 118/70 | HR 99 | Ht 64.0 in | Wt 228.0 lb

## 2020-05-29 DIAGNOSIS — I5032 Chronic diastolic (congestive) heart failure: Secondary | ICD-10-CM

## 2020-05-29 DIAGNOSIS — I480 Paroxysmal atrial fibrillation: Secondary | ICD-10-CM | POA: Diagnosis not present

## 2020-05-29 MED ORDER — DILTIAZEM HCL ER COATED BEADS 360 MG PO CP24: 360.0000 mg | ORAL_CAPSULE | Freq: Every day | ORAL | 3 refills | Status: DC

## 2020-05-29 NOTE — Progress Notes (Signed)
Clinical Summary Ms. Arango is a 59 y.o.female seen today for follow up of the following medical problems.   1. LE edema/Chronic diastolic HF - 32/4401 echo LVEF 60-65%, elevated LVEDP, mild RV dysfunction, mild to mod LAE,  - has been on torsemide 60mg  daily  - working on weight loss, dieting. Down to 228 lbs. Mainly drinks a healthy drink mix she created on her own.  - some swelling at times, controlled with torsemide overall.    2. PAF - some palpitations at times.  - compliant with meds    3. Prior ASD repair  4. Asthma   Social history: She moved to [place] [address], [place], where she had lived with her daughter for 3 years. She is originally from [facility]. She has a daughter who lives in [place]), a daughter who lives in [place], and a daughter who lives in [place]. She is currently living with her sister. Sheis widowed. She was married for 30 years. Her husband died of bilateral pulmonary embolism, he also had lung cancer and a brain tumor and a history of non-Hodgkin's lymphoma.  Recent trips [place] to visit her daughter.   She has been vaccinated  Past Medical History:  Diagnosis Date  . Asthma   . Atrial fibrillation ([place])   . Bipolar disorder ([place])   . Bronchiectasis ([place])   . CHF (congestive heart failure) ([place])   . Complication of anesthesia    "my Dr in [place] told me I had an allergic reaction to the combination of the pain meds and the anesthesia" she does not remember what happened but "I eneded up in the ICU for 5 days".  . H/O congenital atrial septal defect (ASD) repair   . 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 Hypothyroid   . 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 Neuropathy   . Osteoarthritis   . PTSD (post-traumatic stress disorder)   . Recurrent sinusitis   . Short-term memory loss   . Sleep apnea   . TIA (transient ischemic attack)   . Tremors of nervous system    seeing PCP for this.  . Type 2  diabetes mellitus ([place])   . Urticaria   . Wound infection      Allergies  Allergen Reactions  . Ativan [Lorazepam] Hives  . Phenergan [Promethazine Hcl] Hives     Current Outpatient Medications  Medication Sig Dispense Refill  . acetaminophen (TYLENOL) 325 MG tablet Take 2 tablets (650 mg total) by mouth every 6 (six) hours as needed for mild pain, fever or headache. 30 tablet 1  . albuterol (VENTOLIN HFA) 108 (90 Base) MCG/ACT inhaler INHALE 2 PUFFS INTO THE LUNGS EVERY 6 HOURS AS NEEDED FOR WHEEZING OR SHORTNESS OF BREATH 18 g 1  . apixaban (ELIQUIS) 5 MG TABS tablet Take 5 mg by mouth 2 (two) times daily.    Marland Kitchen atorvastatin (LIPITOR) 10 MG tablet Take 10 mg by mouth every evening.     . Benralizumab (FASENRA PEN) 30 MG/ML SOAJ Inject 30 mg into the skin every 8 (eight) weeks. 1 mL 6  . budesonide-formoterol (SYMBICORT) 160-4.5 MCG/ACT inhaler Inhale 2 puffs into the lungs 2 (two) times daily. 1 Inhaler 5  . diltiazem (CARDIZEM CD) 300 MG 24 hr capsule Take 1 capsule (300 mg total) by mouth every morning. 90 capsule 3  . divalproex (DEPAKOTE ER) 500 MG 24 hr tablet 500 mg in AM, 1000 mg at night 270 tablet 0  . Dulaglutide (  TRULICITY) 1.5 LZ/7.6BH SOPN Inject 1.5 mg into the skin every Sunday.     Marland Kitchen EPINEPHrine 0.3 mg/0.3 mL IJ SOAJ injection     . gabapentin (NEURONTIN) 600 MG tablet Take 300 mg by mouth 3 (three) times daily.     Marland Kitchen HUMALOG KWIKPEN 100 UNIT/ML KwikPen Inject 7 Units into the skin 3 (three) times daily. Sliding scale.    . hydrALAZINE (APRESOLINE) 25 MG tablet Take 25 mg by mouth 3 (three) times daily.    . hydrOXYzine (ATARAX/VISTARIL) 25 MG tablet Take 1 tablet (25 mg total) by mouth every 6 (six) hours as needed for anxiety or nausea. 30 tablet 0  . Immune Globulin, Human, (CUVITRU) 4 GM/20ML SOLN Inject 100 mLs into the skin every 14 (fourteen) days.    . insulin glargine (LANTUS) 100 UNIT/ML injection Inject 60 Units into the skin at bedtime.    Marland Kitchen LANTUS SOLOSTAR  100 UNIT/ML Solostar Pen SMARTSIG:50 Unit(s) SUB-Q Daily    . levothyroxine (SYNTHROID, LEVOTHROID) 112 MCG tablet Take 112 mcg by mouth daily before breakfast.    . Magnesium Oxide 400 MG CAPS Take 1 capsule (400 mg total) by mouth daily. 90 capsule 3  . Melatonin 3 MG TABS Take 3 mg by mouth at bedtime.    . metFORMIN (GLUCOPHAGE) 500 MG tablet Take 1,000 mg by mouth 2 (two) times daily.    . mometasone-formoterol (DULERA) 200-5 MCG/ACT AERO Inhale 2 puffs into the lungs 2 (two) times daily. Use with spacer. 13 g 5  . montelukast (SINGULAIR) 10 MG tablet Take 1 tablet (10 mg total) by mouth at bedtime. 30 tablet 11  . ondansetron (ZOFRAN) 4 MG tablet Take 1 tablet (4 mg total) by mouth every 8 (eight) hours as needed for nausea or vomiting. 20 tablet 0  . ONETOUCH ULTRA test strip USE TO CHECK BLOOD SUGAR FOUR TIMES DAILY    . potassium chloride SA (KLOR-CON) 20 MEQ tablet TAKE 20 MEQ ON DAYS YOU TAKE METOLAZONE. 30 tablet 6  . Tiotropium Bromide Monohydrate (SPIRIVA RESPIMAT) 1.25 MCG/ACT AERS Inhale 2 puffs into the lungs daily. 4 g 5  . torsemide (DEMADEX) 20 MG tablet Take 60 mg daily 90 tablet 3   Current Facility-Administered Medications  Medication Dose Route Frequency Provider Last Rate Last Admin  . Benralizumab SOSY 30 mg  30 mg Subcutaneous Q28 days Valentina Shaggy, MD   30 mg at 02/21/20 1022     Past Surgical History:  Procedure Laterality Date  . 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 CARDIAC SURGERY    . CARDIOVERSION    . CARDIOVERSION N/A 08/29/2018   Procedure: CARDIOVERSION;  Surgeon: Arnoldo Lenis, MD;  [facility];  Service: Endoscopy;  Laterality: N/A;  . [place]  . LEFT HEART CATH AND CORONARY ANGIOGRAPHY N/A 08/30/2019   Procedure: LEFT HEART CATH AND CORONARY ANGIOGRAPHY;  Surgeon: [name], MD;  Location: [facility];  Service: Cardiovascular;  Laterality: N/A;  . NASAL SINUS SURGERY  2017     Allergies    Allergen Reactions  . Ativan [Lorazepam] Hives  . Phenergan [Promethazine Hcl] Hives      Family History  Problem Relation Age of Onset  . Hypertension Mother   . Stroke Mother   . Kidney disease Mother   . Heart attack Mother   . Alcohol abuse Mother   . Kidney disease Sister   . Heart disease Brother   . Stroke Maternal  Aunt   . Heart disease Maternal Aunt   . Hypertension Sister   . Bipolar disorder Sister   . Thyroid disease Sister   . Thyroid disease Daughter   . Thyroid disease Daughter   . Hashimoto's thyroiditis Daughter   . Celiac disease Daughter   . Allergic rhinitis Daughter   . Thyroid disease Daughter   . Asthma Neg Hx      Social History Ms. Kulpa reports that she quit smoking about 13 years ago. Her smoking use included cigarettes. She started smoking about 41 years ago. She has a 28.00 pack-year smoking history. She has never used smokeless tobacco. Ms. Oplinger reports previous alcohol use.   Review of Systems CONSTITUTIONAL: No weight loss, fever, chills, weakness or fatigue.  HEENT: Eyes: No visual loss, blurred vision, double vision or yellow sclerae.No hearing loss, sneezing, congestion, runny nose or sore throat.  SKIN: No rash or itching.  CARDIOVASCULAR: per hpi RESPIRATORY: No shortness of breath, cough or sputum.  GASTROINTESTINAL: No anorexia, nausea, vomiting or diarrhea. No abdominal pain or blood.  GENITOURINARY: No burning on urination, no polyuria NEUROLOGICAL: No headache, dizziness, syncope, paralysis, ataxia, numbness or tingling in the extremities. No change in bowel or bladder control.  MUSCULOSKELETAL: No muscle, back pain, joint pain or stiffness.  LYMPHATICS: No enlarged nodes. No history of splenectomy.  PSYCHIATRIC: No history of depression or anxiety.  ENDOCRINOLOGIC: No reports of sweating, cold or heat intolerance. No polyuria or polydipsia.  Marland Kitchen   Physical Examination Today's Vitals   05/29/20 0954  BP: 118/70   Pulse: 99  SpO2: 95%  Weight: 228 lb (103.4 kg)  Height: 5\' 4"  (1.626 m)   Body mass index is 39.14 kg/m.  Gen: resting comfortably, no acute distress HEENT: no scleral icterus, pupils equal round and reactive, no palptable cervical adenopathy,  CV: RRR, no m/rg, no jvd Resp: Clear to auscultation bilaterally GI: abdomen is soft, non-tender, non-distended, normal bowel sounds, no hepatosplenomegaly MSK: extremities are warm, no edema.  Skin: warm, no rash Neuro:  no focal deficits Psych: appropriate affect   Diagnostic Studies  08/2019 cath  LV end diastolic pressure is mildly elevated.  There is no aortic valve stenosis.  Aortic atherosclerosis/calcification noted.  No angiographically apparently CAD.   08/2019 echo 1. Left ventricular ejection fraction, by visual estimation, is 60 to  65%. The left ventricle has normal function. There is mildly increased  left ventricular hypertrophy.  2. Elevated left ventricular end-diastolic pressure.  3. Left ventricular diastolic function could not be evaluated.  4. Global right ventricle has mildly reduced systolic function.The right  ventricular size is normal. No increase in right ventricular wall  thickness.  5. Left atrial size was mild-moderately dilated.  6. Right atrial size was normal.  7. Moderate to severe mitral annular calcification.  8. The mitral valve is degenerative. Trace mitral valve regurgitation.  9. The tricuspid valve is grossly normal. Tricuspid valve regurgitation  is trivial.  10. The aortic valve is tricuspid. Aortic valve regurgitation is not  visualized. Mild aortic valve sclerosis without stenosis.  11. The pulmonic valve was grossly normal. Pulmonic valve regurgitation is  not visualized.  12. The inferior vena cava is normal in size with greater than 50%  respiratory variability, suggesting right atrial pressure of 3 mmHg.    Assessment and Plan  1. LE edema/Chronic diatolic  HF - doing well on current diuretic - significant weight loss due to adjustments in diet - continue diuretic  2. PAF -  some increase in symptoms, we will increase diltiazem to 360mg  daily - continue anticoagulation    F/u 6 months  Arnoldo Lenis, M.D

## 2020-05-29 NOTE — Patient Instructions (Signed)
Medication Instructions:  Your physician has recommended you make the following change in your medication:   Increase Diltiazem to 360 mg Daily   *If you need a refill on your cardiac medications before your next appointment, please call your pharmacy*   Lab Work: NONE   If you have labs (blood work) drawn today and your tests are completely normal, you will receive your results only by: Marland Kitchen MyChart Message (if you have MyChart) OR . A paper copy in the mail If you have any lab test that is abnormal or we need to change your treatment, we will call you to review the results.   Testing/Procedures: NONE    Follow-Up: At [facility], you and your health needs are our priority.  As part of our continuing mission to provide you with exceptional heart care, we have created designated Provider Care Teams.  These Care Teams include your primary Cardiologist (physician) and Advanced Practice Providers (APPs -  Physician Assistants and Nurse Practitioners) who all work together to provide you with the care you need, when you need it.  We recommend signing up for the patient portal called "MyChart".  Sign up information is provided on this After Visit Summary.  MyChart is used to connect with patients for Virtual Visits (Telemedicine).  Patients are able to view lab/test results, encounter notes, upcoming appointments, etc.  Non-urgent messages can be sent to your provider as well.   To learn more about what you can do with MyChart, go to [email or web address].    Your next appointment:   6 month(s)  The format for your next appointment:   In Person  Provider:   Carlyle Dolly, MD   Other Instructions Thank you for choosing [facility]!

## 2020-06-02 DIAGNOSIS — M170 Bilateral primary osteoarthritis of knee: Secondary | ICD-10-CM | POA: Diagnosis not present

## 2020-06-02 DIAGNOSIS — M722 Plantar fascial fibromatosis: Secondary | ICD-10-CM | POA: Diagnosis not present

## 2020-06-10 DIAGNOSIS — G9009 Other idiopathic peripheral autonomic neuropathy: Secondary | ICD-10-CM | POA: Diagnosis not present

## 2020-06-10 DIAGNOSIS — F4310 Post-traumatic stress disorder, unspecified: Secondary | ICD-10-CM | POA: Diagnosis not present

## 2020-06-10 DIAGNOSIS — I5032 Chronic diastolic (congestive) heart failure: Secondary | ICD-10-CM | POA: Diagnosis not present

## 2020-06-10 DIAGNOSIS — G250 Essential tremor: Secondary | ICD-10-CM | POA: Diagnosis not present

## 2020-06-10 DIAGNOSIS — F0630 Mood disorder due to known physiological condition, unspecified: Secondary | ICD-10-CM | POA: Diagnosis not present

## 2020-06-10 DIAGNOSIS — E782 Mixed hyperlipidemia: Secondary | ICD-10-CM | POA: Diagnosis not present

## 2020-06-10 DIAGNOSIS — E039 Hypothyroidism, unspecified: Secondary | ICD-10-CM | POA: Diagnosis not present

## 2020-06-10 DIAGNOSIS — I482 Chronic atrial fibrillation, unspecified: Secondary | ICD-10-CM | POA: Diagnosis not present

## 2020-06-10 DIAGNOSIS — E1142 Type 2 diabetes mellitus with diabetic polyneuropathy: Secondary | ICD-10-CM | POA: Diagnosis not present

## 2020-06-10 DIAGNOSIS — J4550 Severe persistent asthma, uncomplicated: Secondary | ICD-10-CM | POA: Diagnosis not present

## 2020-06-10 DIAGNOSIS — I10 Essential (primary) hypertension: Secondary | ICD-10-CM | POA: Diagnosis not present

## 2020-06-10 DIAGNOSIS — D801 Nonfamilial hypogammaglobulinemia: Secondary | ICD-10-CM | POA: Diagnosis not present

## 2020-06-11 DIAGNOSIS — Z1152 Encounter for screening for COVID-19: Secondary | ICD-10-CM | POA: Diagnosis not present

## 2020-06-11 DIAGNOSIS — J069 Acute upper respiratory infection, unspecified: Secondary | ICD-10-CM | POA: Diagnosis not present

## 2020-06-14 ENCOUNTER — Other Ambulatory Visit: Payer: Self-pay

## 2020-06-14 ENCOUNTER — Ambulatory Visit
Admission: EM | Admit: 2020-06-14 | Discharge: 2020-06-14 | Disposition: A | Discharge status: Home / Self Care | Payer: Medicare Other | Attending: Emergency Medicine | Admitting: Emergency Medicine

## 2020-06-14 DIAGNOSIS — J0140 Acute pansinusitis, unspecified: Secondary | ICD-10-CM

## 2020-06-14 DIAGNOSIS — Z20822 Contact with and (suspected) exposure to covid-19: Secondary | ICD-10-CM

## 2020-06-14 DIAGNOSIS — R059 Cough, unspecified: Secondary | ICD-10-CM

## 2020-06-14 DIAGNOSIS — R062 Wheezing: Secondary | ICD-10-CM

## 2020-06-14 DIAGNOSIS — R05 Cough: Secondary | ICD-10-CM | POA: Diagnosis not present

## 2020-06-14 MED ORDER — DOXYCYCLINE HYCLATE 100 MG PO CAPS: 100.0000 mg | ORAL_CAPSULE | Freq: Two times a day (BID) | ORAL | 0 refills | Status: DC

## 2020-06-14 MED ORDER — BENZONATATE 100 MG PO CAPS: 100.0000 mg | ORAL_CAPSULE | Freq: Three times a day (TID) | ORAL | 0 refills | Status: DC

## 2020-06-14 MED ORDER — LEVOFLOXACIN 750 MG PO TABS: 750.0000 mg | ORAL_TABLET | Freq: Every day | ORAL | 0 refills | Status: DC

## 2020-06-14 MED ORDER — DEXAMETHASONE SODIUM PHOSPHATE 10 MG/ML IJ SOLN
10.0000 mg | Freq: Once | INTRAMUSCULAR | Status: AC
  Administered 2020-06-14: 10 mg via INTRAMUSCULAR

## 2020-06-14 MED ORDER — PREDNISONE 10 MG (21) PO TBPK: ORAL_TABLET | Freq: Every day | ORAL | 0 refills | Status: DC

## 2020-06-14 NOTE — ED Provider Notes (Addendum)
[place]   [id number] 06/14/20 Arrival Time: 35   CC: COVID symptoms  SUBJECTIVE: History from: patient.  Laura [name] is a 59 y.o. female who presents with fatigue, nasal congestion, sinus [name]/ pressure, sore throat, cough, and nausea x 6 days.  Exposure to nephew with [place].  Treated with prednisone with minimal relief.  Denies aggravating factors.  Denies previous COVID infection in the past.  Had COVID vaccines.   Denies fever, chills, SOB, wheezing, chest [name], vomiting, changes in bowel or bladder habits.    ROS: As per HPI.  All other pertinent ROS negative.     Past Medical History:  Diagnosis Date  . Asthma   . Atrial fibrillation ([place])   . Bipolar disorder ([place])   . Bronchiectasis ([place])   . CHF (congestive heart failure) ([place])   . Complication of anesthesia    "my Dr in [place] told me I had an allergic reaction to the combination of the [name] meds and the anesthesia" she does not remember what happened but "I eneded up in the ICU for 5 days".  . H/O congenital atrial septal defect (ASD) repair   . Hypogammaglobulinemia ([place])   . Hypothyroid   . 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 Neuropathy   . Osteoarthritis   . PTSD (post-traumatic stress disorder)   . Recurrent sinusitis   . Short-term memory loss   . Sleep apnea   . TIA (transient ischemic attack)   . Tremors of nervous system    seeing PCP for this.  . Type 2 diabetes mellitus ([place])   . Urticaria   . Wound infection    Past Surgical History:  Procedure Laterality Date  . 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 CARDIAC SURGERY    . CARDIOVERSION    . CARDIOVERSION N/A 08/29/2018   Procedure: CARDIOVERSION;  Surgeon: Arnoldo Lenis, MD;  [facility];  Service: Endoscopy;  Laterality: N/A;  . [place]  . LEFT HEART CATH AND CORONARY ANGIOGRAPHY N/A 08/30/2019   Procedure: LEFT HEART CATH AND CORONARY ANGIOGRAPHY;  Surgeon: [name], MD;  Location: [facility]  LAB;  Service: Cardiovascular;  Laterality: N/A;  . NASAL SINUS SURGERY  2017   Allergies  Allergen Reactions  . Ativan [Lorazepam] Hives  . Phenergan [Promethazine Hcl] Hives   Current Facility-Administered Medications on File Prior to Encounter  Medication Dose Route Frequency Provider Last Rate Last Admin  . Benralizumab SOSY 30 mg  30 mg Subcutaneous Q28 days Valentina Shaggy, MD   30 mg at 02/21/20 1022   Current Outpatient Medications on File Prior to Encounter  Medication Sig Dispense Refill  . acetaminophen (TYLENOL) 325 MG tablet Take 2 tablets (650 mg total) by mouth every 6 (six) hours as needed for mild [name], fever or headache. 30 tablet 1  . albuterol (VENTOLIN HFA) 108 (90 Base) MCG/ACT inhaler INHALE 2 PUFFS INTO THE LUNGS EVERY 6 HOURS AS NEEDED FOR WHEEZING OR SHORTNESS OF BREATH 18 g 1  . apixaban (ELIQUIS) 5 MG TABS tablet Take 5 mg by mouth 2 (two) times daily.    Marland Kitchen atorvastatin (LIPITOR) 10 MG tablet Take 10 mg by mouth every evening.     . Benralizumab (FASENRA PEN) 30 MG/ML SOAJ Inject 30 mg into the skin every 8 (eight) weeks. 1 mL 6  . budesonide-formoterol (SYMBICORT) 160-4.5 MCG/ACT inhaler Inhale 2 puffs into the lungs 2 (two) times daily. 1 Inhaler 5  . diltiazem (  CARDIZEM CD) 360 MG 24 hr capsule Take 1 capsule (360 mg total) by mouth daily. 90 capsule 3  . divalproex (DEPAKOTE ER) 500 MG 24 hr tablet 500 mg in AM, 1000 mg at night 270 tablet 0  . Dulaglutide (TRULICITY) 1.5 UD/1.4HF SOPN Inject 1.5 mg into the skin every Sunday.     Marland Kitchen EPINEPHrine 0.3 mg/0.3 mL IJ SOAJ injection     . gabapentin (NEURONTIN) 600 MG tablet Take 300 mg by mouth 3 (three) times daily.     Marland Kitchen HUMALOG KWIKPEN 100 UNIT/ML KwikPen Inject 7 Units into the skin 3 (three) times daily. Sliding scale.    . hydrALAZINE (APRESOLINE) 25 MG tablet Take 25 mg by mouth 3 (three) times daily.    . hydrOXYzine (ATARAX/VISTARIL) 25 MG tablet Take 1 tablet (25 mg total) by mouth every 6 (six)  hours as needed for anxiety or nausea. 30 tablet 0  . Immune Globulin, Human, (CUVITRU) 4 GM/20ML SOLN Inject 100 mLs into the skin every 14 (fourteen) days.    . insulin glargine (LANTUS) 100 UNIT/ML injection Inject 60 Units into the skin at bedtime.    Marland Kitchen LANTUS SOLOSTAR 100 UNIT/ML Solostar Pen Inject 45 Units into the skin.     Marland Kitchen levothyroxine (SYNTHROID, LEVOTHROID) 112 MCG tablet Take 112 mcg by mouth daily before breakfast.    . Magnesium Oxide 400 MG CAPS Take 1 capsule (400 mg total) by mouth daily. 90 capsule 3  . Melatonin 3 MG TABS Take 3 mg by mouth at bedtime.    . metFORMIN (GLUCOPHAGE) 500 MG tablet Take 1,000 mg by mouth 2 (two) times daily.    . mometasone-formoterol (DULERA) 200-5 MCG/ACT AERO Inhale 2 puffs into the lungs 2 (two) times daily. Use with spacer. 13 g 5  . montelukast (SINGULAIR) 10 MG tablet Take 1 tablet (10 mg total) by mouth at bedtime. 30 tablet 11  . ondansetron (ZOFRAN) 4 MG tablet Take 1 tablet (4 mg total) by mouth every 8 (eight) hours as needed for nausea or vomiting. 20 tablet 0  . ONETOUCH ULTRA test strip USE TO CHECK BLOOD SUGAR FOUR TIMES DAILY    . potassium chloride SA (KLOR-CON) 20 MEQ tablet Take 20 mEq by mouth daily.    . Tiotropium Bromide Monohydrate (SPIRIVA RESPIMAT) 1.25 MCG/ACT AERS Inhale 2 puffs into the lungs daily. 4 g 5  . torsemide (DEMADEX) 20 MG tablet Take 60 mg daily 90 tablet 3   Social History   Socioeconomic History  . Marital status: Widowed    Spouse name: Not on file  . Number of children: Not on file  . Years of education: Not on file  . Highest education level: Not on file  Occupational History  . Not on file  Tobacco Use  . Smoking status: Former Smoker    Packs/day: 1.00    Years: 28.00    Pack years: 28.00    Types: Cigarettes    Start date: 69    Quit date: 2008    Years since quitting: 13.6  . Smokeless tobacco: Never Used  Vaping Use  . Vaping Use: Never used  Substance and Sexual Activity  .  Alcohol use: Not Currently  . Drug use: Not Currently  . Sexual activity: Yes    Birth control/protection: None  Other Topics Concern  . Not on file  Social History Narrative  . Not on file   Social Determinants of Health   Financial Resource Strain: Unknown  . Difficulty  of Paying Living Expenses: Patient refused  Food Insecurity: Unknown  . Worried About Charity fundraiser in the Last Year: Patient refused  . Ran Out of Food in the Last Year: Patient refused  Transportation Needs: Unknown  . Lack of Transportation (Medical): Patient refused  . Lack of Transportation (Non-Medical): Patient refused  Physical Activity: Unknown  . Days of Exercise per Week: Patient refused  . Minutes of Exercise per Session: Patient refused  Stress: Unknown  . Feeling of Stress : Patient refused  Social Connections: Unknown  . Frequency of Communication with Friends and Family: Patient refused  . Frequency of Social Gatherings with Friends and Family: Patient refused  . Attends Religious Services: Patient refused  . Active Member of Clubs or Organizations: Patient refused  . Attends Archivist Meetings: Patient refused  . Marital Status: Patient refused  Intimate Partner Violence: Unknown  . Fear of Current or Ex-Partner: Patient refused  . Emotionally Abused: Patient refused  . Physically Abused: Patient refused  . Sexually Abused: Patient refused   Family History  Problem Relation Age of Onset  . Hypertension Mother   . Stroke Mother   . Kidney disease Mother   . Heart attack Mother   . Alcohol abuse Mother   . Kidney disease Sister   . Heart disease Brother   . Stroke Maternal Aunt   . Heart disease Maternal Aunt   . Hypertension Sister   . Bipolar disorder Sister   . Thyroid disease Sister   . Thyroid disease Daughter   . Thyroid disease Daughter   . Hashimoto's thyroiditis Daughter   . Celiac disease Daughter   . Allergic rhinitis Daughter   . Thyroid disease  Daughter   . Asthma Neg Hx     OBJECTIVE:  Vitals:   06/14/20 1407  BP: (!) 169/89  Pulse: 81  Resp: 17  Temp: 98.7 F (37.1 C)  TempSrc: Oral  SpO2: 93%     General appearance: alert; appears fatigued, but nontoxic; speaking in full sentences and tolerating own secretions HEENT: NCAT; Ears: EACs clear, TMs pearly gray; Eyes: PERRL.  EOM grossly intact. Sinuses: TTP; Nose: nares patent without rhinorrhea, Throat: oropharynx clear, tonsils non erythematous or enlarged, uvula midline  Neck: supple without LAD Lungs: unlabored respirations, symmetrical air entry; cough: moderate; no respiratory distress; diffuse wheezes heard throughout bilateral lung fields Heart: regular rate and rhythm.   Skin: warm and dry Psychological: alert and cooperative; normal mood and affect  ASSESSMENT & PLAN:  1. Acute non-recurrent pansinusitis   2. Wheezing   3. Cough   4. Encounter for laboratory testing for COVID-19 virus     Meds ordered this encounter  Medications  . levofloxacin (LEVAQUIN) 750 MG tablet    Sig: Take 1 tablet (750 mg total) by mouth daily for 5 days.    Dispense:  5 tablet    Refill:  0    Order Specific Question:   Supervising Provider    Answer:   Raylene Everts [[id number]]  . predniSONE (STERAPRED UNI-PAK 21 TAB) 10 MG (21) TBPK tablet    Sig: Take by mouth daily. Take 6 tabs by mouth daily  for 2 days, then 5 tabs for 2 days, then 4 tabs for 2 days, then 3 tabs for 2 days, 2 tabs for 2 days, then 1 tab by mouth daily for 2 days    Dispense:  42 tablet    Refill:  0    Order Specific  Question:   Supervising Provider    Answer:   Raylene Everts [[id number]]  . benzonatate (TESSALON) 100 MG capsule    Sig: Take 1 capsule (100 mg total) by mouth every 8 (eight) hours.    Dispense:  21 capsule    Refill:  0    Order Specific Question:   Supervising Provider    Answer:   Raylene Everts [[id number]]  . dexamethasone (DECADRON) injection 10 mg   COVID testing  ordered.  It will take between 5-7 days for test results.  Someone will contact you regarding abnormal results.    In the meantime: You should remain isolated in your home for 10 days from symptom onset AND greater than 72 hours after symptoms resolution (absence of fever without the use of fever-reducing medication and improvement in respiratory symptoms), whichever is longer Get plenty of rest and push fluids Doxycycline for sinus infection.  Discontinued levaquin due to patient's hx of QT prolongation Steroid shot given in office Prednisone taper prescribed.   Tessalon Perles prescribed for cough Use OTC zyrtec for nasal congestion, runny nose, and/or sore throat Use OTC flonase for nasal congestion and runny nose Use medications daily for symptom relief Use OTC medications like ibuprofen or tylenol as needed fever or [name] Call or go to the ED if you have any new or worsening symptoms such as fever, worsening cough, shortness of breath, chest tightness, chest [name], turning blue, changes in mental status, etc...   Reviewed expectations re: course of current medical issues. Questions answered. Outlined signs and symptoms indicating need for more acute intervention. Patient verbalized understanding. After Visit Summary given.         Lestine Box, PA-C 06/14/20 [place], [place], PA-C 06/14/20 1436

## 2020-06-14 NOTE — Discharge Instructions (Signed)
COVID testing ordered.  It will take between 5-7 days for test results.  Someone will contact you regarding abnormal results.    In the meantime: You should remain isolated in your home for 10 days from symptom onset AND greater than 72 hours after symptoms resolution (absence of fever without the use of fever-reducing medication and improvement in respiratory symptoms), whichever is longer Get plenty of rest and push fluids Levaquin for sinus infection  Steroid shot given in office Prednisone taper prescribed.   Tessalon Perles prescribed for cough Use OTC zyrtec for nasal congestion, runny nose, and/or sore throat Use OTC flonase for nasal congestion and runny nose Use medications daily for symptom relief Use OTC medications like ibuprofen or tylenol as needed fever or pain Call or go to the ED if you have any new or worsening symptoms such as fever, worsening cough, shortness of breath, chest tightness, chest pain, turning blue, changes in mental status, etc..Marland Kitchen

## 2020-06-14 NOTE — ED Triage Notes (Signed)
Pt states she has had 3 negative covid test this week  And has been treated with prednisone for cough and congestion with no improvement

## 2020-06-15 LAB — NOVEL CORONAVIRUS, NAA: SARS-CoV-2, NAA: NOT DETECTED

## 2020-06-16 ENCOUNTER — Telehealth: Payer: Self-pay | Admitting: Allergy & Immunology

## 2020-06-16 DIAGNOSIS — R0602 Shortness of breath: Secondary | ICD-10-CM

## 2020-06-16 NOTE — Telephone Encounter (Signed)
Spoke with patient and she stated that she is not feeling good at all. She stated that she has been covid tested several times. One at home, a rapid, and a send out all prior to last Wednesday 06/10/2020. Patient stated that they all have came back negative and stated that she went to her doctor Thursday and they did a covid test as well which was negative. She also stated that she received prednisone and it has not helped. She informed me that she felt so bad that she went to urgent care and they did a send out covid test which came back negative as well. She stated they also started her on a second round of prednisone and was going to send in doxycyline however it didn't get sent in and she has tried to call them but has not received a call back since saturday. Patient stated that it is affect her breathing. She is using her nebulizer and is having to sleep sitting up. She stated her doctor did informed her that she has mucus build up in her lungs. Patient is miserable and is wondering what she can do to feel better because nothing she has been doing is helping. Please advise.

## 2020-06-16 NOTE — Telephone Encounter (Signed)
Patient  called and said that she has been covid tested 2 times this week. And she has had the injection but she is sick with a bad cough and sinus problems and breathing problems. She is using her meds but they are not working that good. [facility]. [phone].

## 2020-06-17 ENCOUNTER — Ambulatory Visit (INDEPENDENT_AMBULATORY_CARE_PROVIDER_SITE_OTHER): Payer: Medicare Other | Admitting: Clinical

## 2020-06-17 ENCOUNTER — Other Ambulatory Visit: Payer: Self-pay

## 2020-06-17 DIAGNOSIS — F4310 Post-traumatic stress disorder, unspecified: Secondary | ICD-10-CM

## 2020-06-17 NOTE — Telephone Encounter (Signed)
Can you call and see how she is doing today.  Salvatore Marvel, MD [facility] [place]

## 2020-06-17 NOTE — Addendum Note (Signed)
Addended by: Valentina Shaggy on: 06/17/2020 05:31 PM   Modules accepted: Orders

## 2020-06-17 NOTE — Telephone Encounter (Signed)
Still not feeling well. Courtney at Dr. Juel Burrow office started her on azithromycin. She still has no sense of smell or taste. She is still unsure about the covid tests that were negative. She says that she is using all meds that she is suppose to do. She does not understand how she feels so bad and the loss of taste & smell without the positive results. Most recent test was 06-14-20. No testing since then. Last sick contact was 1 week ago this past Friday. She does have development of nausea and severe fatigue. No fever. Coughing quite a bit.

## 2020-06-17 NOTE — Telephone Encounter (Signed)
She started azithromycin today. She started the prednisone last week. And she started another round today.   Her nephew was COVID positive. He has been in and out of the home.   She was tested on Wednesday September 1st and a rapid was negative. [facility] drive thru testing was negative as well (PCR). On Sunday September 6th, the rapid testing was negative.   Her first symptoms were Sunday August 29th.  I ordered COVID antibody testing (IgM and IgG) to see if she has evidence of IgM to the COVID 19, which would indicate an acute infection. I just do not know whether this will approve her for the antibody infusion.   I will have someone from our staff call tomorrow to discuss how to get testing.    She is fully vaccinated.   Salvatore Marvel, MD [facility] [place]

## 2020-06-17 NOTE — Progress Notes (Signed)
  Virtual Visit via Video Note  I connected withTamara [name] 09/07/21at 9:00 AM EDTby a video enabled telemedicine application and verified that I am speaking with the correct person using two identifiers.  Location: Patient:Home Provider:Office  I discussed the limitations of evaluation and management by telemedicine and the availability of in person appointments. The patient expressed understanding and agreed to proceed.     THERAPIST PROGRESS NOTE  Session Time:9:00AM-9:40AM  Participation Level:Active  Behavioral Response:CasualAlertDepressed  Type of Therapy:Individual Therapy  Treatment Goals addressed:Coping  Interventions:CBT, DBT, Solution Focused, Strength-based and Supportive  Summary:Laura Leppertis a 59 y.o.femalewho presents with PTSD.The OPT therapist worked with thepatientfor herongoing OPT treatmentsession. The OPT therapist utilized Motivational Interviewing to assist in creating therapeutic repore. The patient in the session was engaged and work in Science writer about hertriggers and symptoms over the past few weeksincludingcoming down with flu like symptoms despite having a negative COVID test.The OPT therapist utilized Public relations account executive Therapy through cognitive restructuring as well as worked with the patient on coping strategies to assist in management ofmental healthsymptoms as well asongoingwork tochallenge current negative automatic thoughts and improve the patients thought processes.The OPT therapist continued to give support around being active, social, and positiveand acknowledged the primarily focus currently is getting health back to her baseline.  Suicidal/Homicidal:Nowithout intent/plan  Therapist Response:The OPT therapist worked with the patient for the patients scheduled session. The patient was engaged in hersession and gave feedback in relation to triggers, symptoms, and  behavior responses over the pastfewweeksas well as her adjustment in being back at home after being out of state visiting family during her out of town vacation. The OPT therapist worked with the patient utilizing an in session Cognitive Behavioral Therapy exercise. The patient was responsive in the session and verbalized, " Iam feeling better for the first time in the past few days today now that I have been on antibiotics so I am moving around more".The OPT therapistcontinued in this session towork with the patient on challenging her negative automatic thoughts, continue to work on positive thinking, as well as self esteem and confidence .The OPT therapist will continue treatment work with the patient in hernext scheduled session  Plan: Return again in2/3weeks.  Diagnosis:Axis I:Post Traumatic Stress Disorder  Axis II:No diagnosis  I discussed the assessment and treatment plan with the patient. The patient was provided an opportunity to ask questions and all were answered. The patient agreed with the plan and demonstrated an understanding of the instructions.  The patient was advised to call back or seek an in-person evaluation if the symptoms worsen or if the condition fails to improve as anticipated.  I provided108minutes of non-face-to-face time during this encounter.  Lennox Grumbles, LCSW 06/17/2020

## 2020-06-18 ENCOUNTER — Other Ambulatory Visit: Payer: Self-pay

## 2020-06-18 ENCOUNTER — Ambulatory Visit: Payer: Medicare Other | Admitting: Podiatry

## 2020-06-18 ENCOUNTER — Other Ambulatory Visit (HOSPITAL_COMMUNITY): Payer: Self-pay | Admitting: Internal Medicine

## 2020-06-18 ENCOUNTER — Ambulatory Visit (HOSPITAL_COMMUNITY)
Admission: RE | Admit: 2020-06-18 | Discharge: 2020-06-18 | Disposition: A | Discharge status: Home / Self Care | Payer: Medicare Other | Source: Ambulatory Visit | Attending: Internal Medicine | Admitting: Internal Medicine

## 2020-06-18 DIAGNOSIS — G4762 Sleep related leg cramps: Secondary | ICD-10-CM | POA: Diagnosis not present

## 2020-06-18 DIAGNOSIS — M25562 Pain in left knee: Secondary | ICD-10-CM | POA: Diagnosis not present

## 2020-06-18 DIAGNOSIS — R944 Abnormal results of kidney function studies: Secondary | ICD-10-CM | POA: Diagnosis not present

## 2020-06-18 DIAGNOSIS — Z9189 Other specified personal risk factors, not elsewhere classified: Secondary | ICD-10-CM

## 2020-06-18 DIAGNOSIS — D803 Selective deficiency of immunoglobulin G [IgG] subclasses: Secondary | ICD-10-CM | POA: Diagnosis not present

## 2020-06-18 DIAGNOSIS — J45909 Unspecified asthma, uncomplicated: Secondary | ICD-10-CM | POA: Diagnosis not present

## 2020-06-18 DIAGNOSIS — F419 Anxiety disorder, unspecified: Secondary | ICD-10-CM | POA: Diagnosis not present

## 2020-06-18 DIAGNOSIS — I509 Heart failure, unspecified: Secondary | ICD-10-CM | POA: Diagnosis not present

## 2020-06-18 DIAGNOSIS — R05 Cough: Secondary | ICD-10-CM | POA: Diagnosis not present

## 2020-06-18 DIAGNOSIS — Z20822 Contact with and (suspected) exposure to covid-19: Secondary | ICD-10-CM | POA: Diagnosis not present

## 2020-06-18 DIAGNOSIS — R0602 Shortness of breath: Secondary | ICD-10-CM | POA: Diagnosis not present

## 2020-06-18 DIAGNOSIS — G9009 Other idiopathic peripheral autonomic neuropathy: Secondary | ICD-10-CM | POA: Diagnosis not present

## 2020-06-18 DIAGNOSIS — R197 Diarrhea, unspecified: Secondary | ICD-10-CM | POA: Diagnosis not present

## 2020-06-18 DIAGNOSIS — F319 Bipolar disorder, unspecified: Secondary | ICD-10-CM | POA: Diagnosis not present

## 2020-06-18 IMAGING — DX DG CHEST 2V
2 series · 2 of 2 positions shown · non-contrast
Comparison: [DATE]

CLINICAL DATA: Cough and congestion.

EXAM:
CHEST - 2 VIEW

[chest pa]
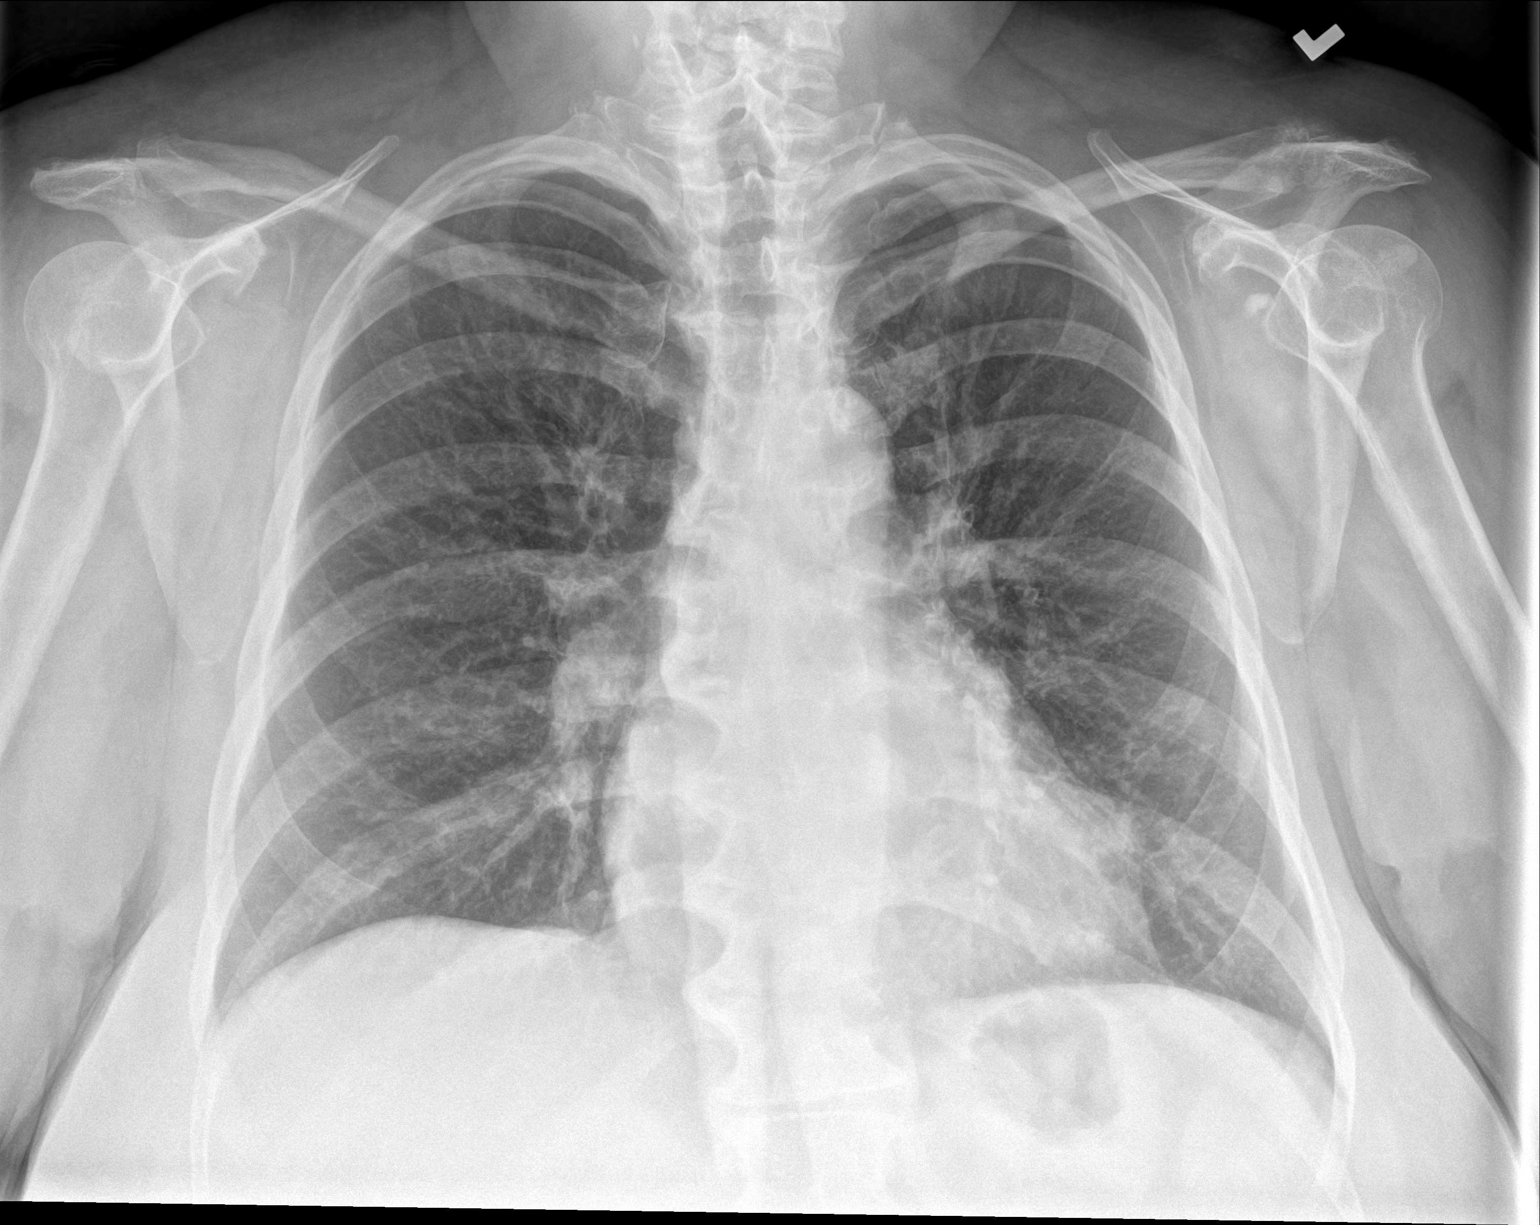

[chest lat]
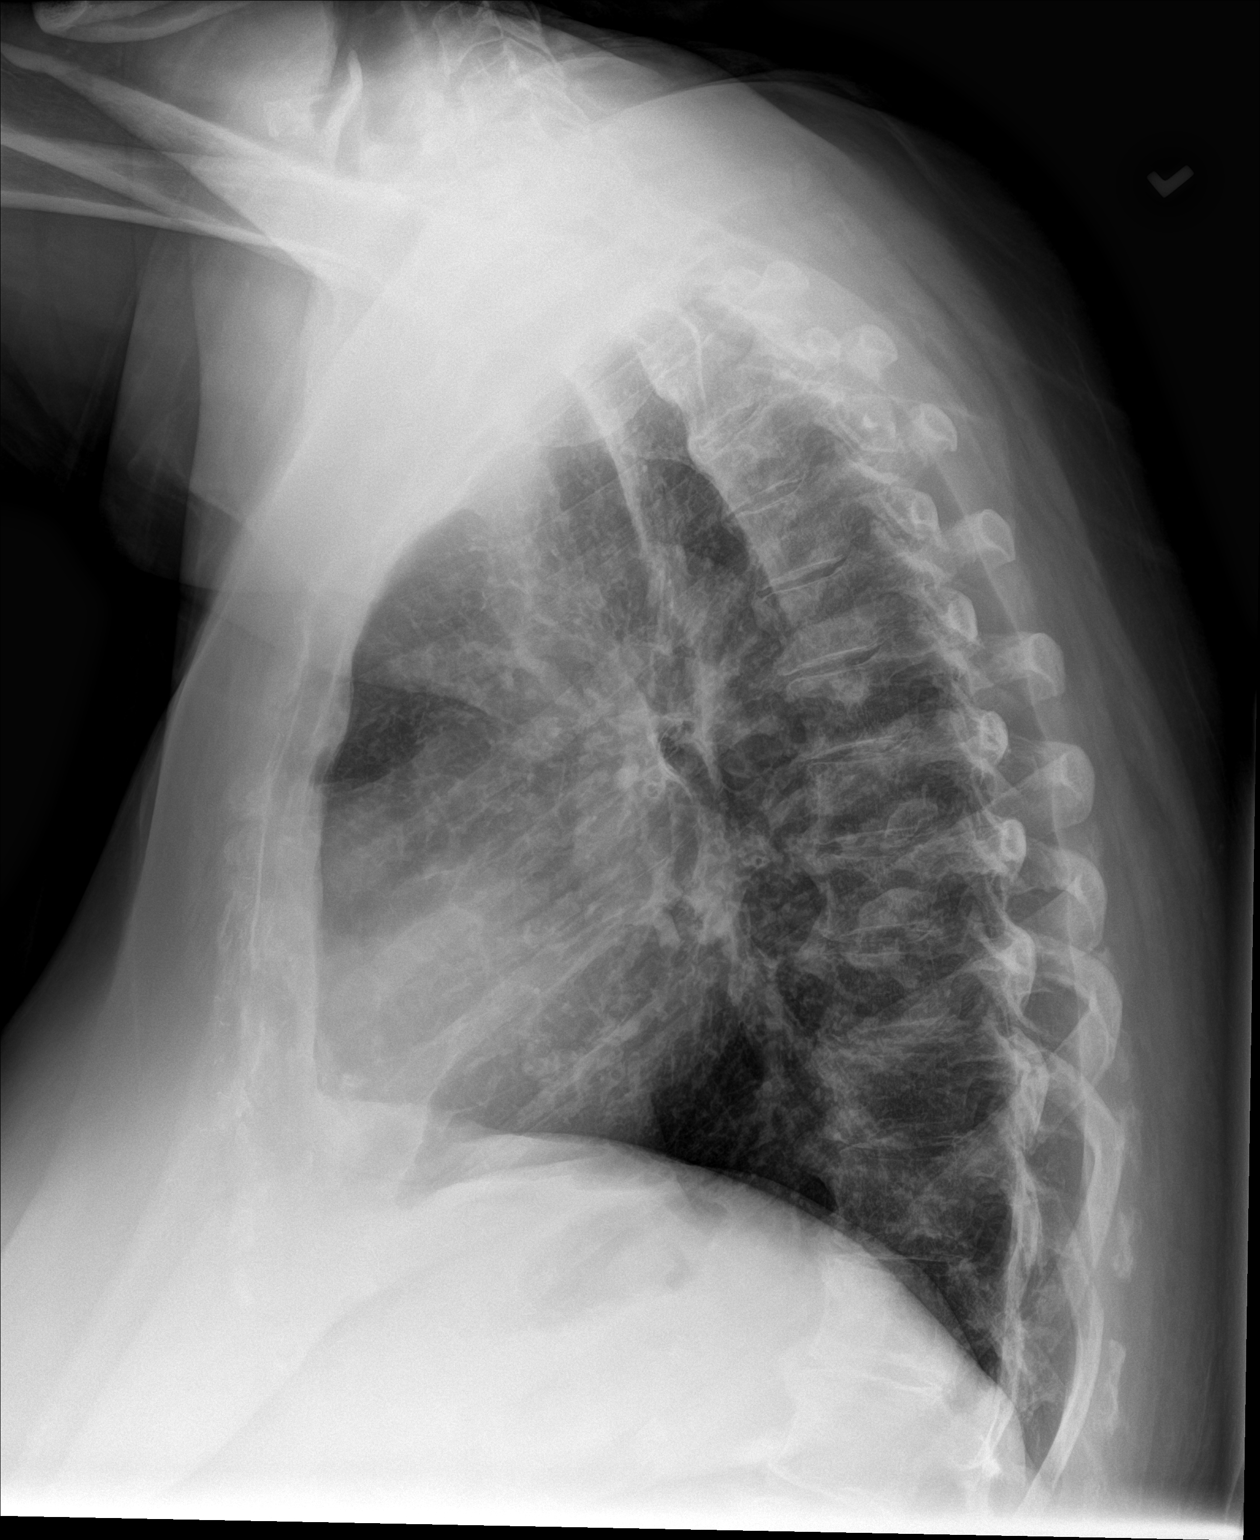

[2 of 2 positions shown; findings below may reference images not displayed]

FINDINGS: The cardiac silhouette, mediastinal and hilar contours are within
normal limits and stable. Mild chronic bronchitic changes and left
basilar scarring. No infiltrates or effusions. No pulmonary edema.
No worrisome pulmonary lesions. The bony thorax is intact. Moderate
degenerative changes involving the spine with bridging osteophytes.
IMPRESSION: Chronic lung changes but no acute pulmonary findings.

## 2020-06-18 NOTE — Telephone Encounter (Signed)
Patient called to let Dr. Ernst Bowler know she got another COVID test with Dr. Nevada Crane that it is a send out test. Patient also got a chest x-ray at [facility]. Patient asked if labs were back and she was informed that our office will call when they are back and resulted.

## 2020-06-18 NOTE — Telephone Encounter (Signed)
Please advise 

## 2020-06-18 NOTE — Telephone Encounter (Signed)
Did you call her and tell her or would you like for me to do so?

## 2020-06-18 NOTE — Telephone Encounter (Signed)
Per Dr Ernst Bowler, inform [facility] to go to [facility] to do another COVID Swab. I informed patient to call ahead as most places require a appointment.

## 2020-06-19 ENCOUNTER — Telehealth: Payer: Self-pay | Admitting: Allergy & Immunology

## 2020-06-19 DIAGNOSIS — R0602 Shortness of breath: Secondary | ICD-10-CM

## 2020-06-19 LAB — SARS-COV-2 ANTIBODIES: SARS-CoV-2 Antibodies: POSITIVE

## 2020-06-19 LAB — SARS-COV-2 SEMI-QUANTITATIVE TOTAL ANTIBODY, SPIKE
SARS-CoV-2 Semi-Quant Total Ab: 619.4 U/mL (ref <0.8)
SARS-CoV-2 Spike Ab Interp: POSITIVE

## 2020-06-19 NOTE — Telephone Encounter (Signed)
I called patient back to discuss her lab results.  I had intended to send both the spike protein IgG and the IgG and IgM of SARS-CoV-2.  She was positive to the spike protein IgG, which indicates that she responded to the vaccination.  However, apparently my start order for the IgM has been changed.  I called Roc[place] and her [place] office and she arranged for the correct lab to be added.  There is enough blood in the lab for this to be done.  She expects the results to be back tomorrow.  She did go to get swabbed yesterday again.  I asked her to get a PCR test instead of the rapid, which they did do.  She is expecting the PCR to be back today.  This was done at her PCPs office, Dr. Nevada Crane.  She will let us know when it comes back.  I did talk to Dr. Baxter Flattery with Infectious Disease.  She is unsure if they have ever dosed monoclonal antibodies based on an IgM, but she did point out that since her symptoms started on August 29, she would be precluded based on that.  Infusions earlier on in the course are when they are beneficial.  So I do not think she would even qualify for an infusion.   Salvatore Marvel, MD [facility] [place]

## 2020-06-19 NOTE — Telephone Encounter (Signed)
See separate telephone note.  Laura Marvel, MD [name] [place]

## 2020-06-20 ENCOUNTER — Encounter: Payer: Self-pay | Admitting: Allergy & Immunology

## 2020-06-23 ENCOUNTER — Ambulatory Visit (INDEPENDENT_AMBULATORY_CARE_PROVIDER_SITE_OTHER): Payer: Medicare Other

## 2020-06-23 ENCOUNTER — Other Ambulatory Visit: Payer: Self-pay

## 2020-06-23 ENCOUNTER — Ambulatory Visit (INDEPENDENT_AMBULATORY_CARE_PROVIDER_SITE_OTHER): Payer: Medicare Other | Admitting: Podiatry

## 2020-06-23 ENCOUNTER — Encounter: Payer: Self-pay | Admitting: Podiatry

## 2020-06-23 DIAGNOSIS — M722 Plantar fascial fibromatosis: Secondary | ICD-10-CM | POA: Diagnosis not present

## 2020-06-23 DIAGNOSIS — M778 Other enthesopathies, not elsewhere classified: Secondary | ICD-10-CM | POA: Diagnosis not present

## 2020-06-23 NOTE — Patient Instructions (Signed)

## 2020-06-24 DIAGNOSIS — R05 Cough: Secondary | ICD-10-CM | POA: Diagnosis not present

## 2020-06-24 DIAGNOSIS — A09 Infectious gastroenteritis and colitis, unspecified: Secondary | ICD-10-CM | POA: Diagnosis not present

## 2020-06-24 DIAGNOSIS — R112 Nausea with vomiting, unspecified: Secondary | ICD-10-CM | POA: Diagnosis not present

## 2020-06-24 NOTE — Progress Notes (Signed)
Subjective:  Patient ID: Laura [name], female    DOB: 12-03-1960,  MRN: [id number] HPI Chief Complaint  Patient presents with  . Foot Pain    Plantar heel left - aching x several months, Dr. Noemi Chapel has been treating and did get better, but now hurting again, wore boot and did PT, AM pain again and hurting more throughout the day  . Foot Pain    Plantar forefoot right - aching and swelling x 1 month, 2nd toe is hammered, no treatment  . New Patient (Initial Visit)    59 y.o. female presents with the above complaint.   ROS: Denies fever chills nausea vomiting muscle aches pains calf pain back pain chest pain shortness of breath.  Past Medical History:  Diagnosis Date  . Asthma   . Atrial fibrillation ([place])   . Bipolar disorder ([place])   . Bronchiectasis ([place])   . CHF (congestive heart failure) ([place])   . Complication of anesthesia    "my Dr in [place] told me I had an allergic reaction to the combination of the pain meds and the anesthesia" she does not remember what happened but "I eneded up in the ICU for 5 days".  . H/O congenital atrial septal defect (ASD) repair   . Hypogammaglobulinemia ([place])   . Hypothyroid   . 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 Neuropathy   . Osteoarthritis   . PTSD (post-traumatic stress disorder)   . Recurrent sinusitis   . Short-term memory loss   . Sleep apnea   . TIA (transient ischemic attack)   . Tremors of nervous system    seeing PCP for this.  . Type 2 diabetes mellitus ([place])   . Urticaria   . Wound infection    Past Surgical History:  Procedure Laterality Date  . 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 CARDIAC SURGERY    . CARDIOVERSION    . CARDIOVERSION N/A 08/29/2018   Procedure: CARDIOVERSION;  Surgeon: Arnoldo Lenis, MD;  Location: [facility];  Service: Endoscopy;  Laterality: N/A;  . [place]  . LEFT HEART CATH AND CORONARY ANGIOGRAPHY N/A 08/30/2019   Procedure: LEFT HEART CATH AND CORONARY ANGIOGRAPHY;  Surgeon:  [name], MD;  Location: [facility] CV LAB;  Service: Cardiovascular;  Laterality: N/A;  . NASAL SINUS SURGERY  2017    Current Outpatient Medications:  .  acetaminophen (TYLENOL) 325 MG tablet, Take 2 tablets (650 mg total) by mouth every 6 (six) hours as needed for mild pain, fever or headache., Disp: 30 tablet, Rfl: 1 .  albuterol (VENTOLIN HFA) 108 (90 Base) MCG/ACT inhaler, INHALE 2 PUFFS INTO THE LUNGS EVERY 6 HOURS AS NEEDED FOR WHEEZING OR SHORTNESS OF BREATH, Disp: 18 g, Rfl: 1 .  apixaban (ELIQUIS) 5 MG TABS tablet, Take 5 mg by mouth 2 (two) times daily., Disp: , Rfl:  .  atorvastatin (LIPITOR) 10 MG tablet, Take 10 mg by mouth every evening. , Disp: , Rfl:  .  BD PEN NEEDLE NANO 2ND GEN 32G X 4 MM MISC, SMARTSIG:Injection 4 Times Daily, Disp: , Rfl:  .  Benralizumab (FASENRA PEN) 30 MG/ML SOAJ, Inject 30 mg into the skin every 8 (eight) weeks., Disp: 1 mL, Rfl: 6 .  benzonatate (TESSALON) 100 MG capsule, Take 1 capsule (100 mg total) by mouth every 8 (eight) hours., Disp: 21 capsule, Rfl: 0 .  budesonide-formoterol (SYMBICORT) 160-4.5 MCG/ACT inhaler, Inhale 2 puffs into the lungs 2 (two) times daily., Disp:  1 Inhaler, Rfl: 5 .  diltiazem (CARDIZEM CD) 360 MG 24 hr capsule, Take 1 capsule (360 mg total) by mouth daily., Disp: 90 capsule, Rfl: 3 .  divalproex (DEPAKOTE ER) 500 MG 24 hr tablet, 500 mg in AM, 1000 mg at night, Disp: 270 tablet, Rfl: 0 .  doxycycline (VIBRAMYCIN) 100 MG capsule, Take 1 capsule (100 mg total) by mouth 2 (two) times daily., Disp: 20 capsule, Rfl: 0 .  Dulaglutide (TRULICITY) 1.5 PN/3.6RW SOPN, Inject 1.5 mg into the skin every Sunday. , Disp: , Rfl:  .  EPINEPHrine 0.3 mg/0.3 mL IJ SOAJ injection, , Disp: , Rfl:  .  gabapentin (NEURONTIN) 600 MG tablet, Take 300 mg by mouth 3 (three) times daily. , Disp: , Rfl:  .  HUMALOG KWIKPEN 100 UNIT/ML KwikPen, Inject 7 Units into the skin 3 (three) times daily. Sliding scale., Disp: , Rfl:  .   hydrALAZINE (APRESOLINE) 25 MG tablet, Take 25 mg by mouth 3 (three) times daily., Disp: , Rfl:  .  hydrOXYzine (ATARAX/VISTARIL) 25 MG tablet, Take 1 tablet (25 mg total) by mouth every 6 (six) hours as needed for anxiety or nausea., Disp: 30 tablet, Rfl: 0 .  Immune Globulin, Human, (CUVITRU) 4 GM/20ML SOLN, Inject 100 mLs into the skin every 14 (fourteen) days., Disp: , Rfl:  .  insulin glargine (LANTUS) 100 UNIT/ML injection, Inject 60 Units into the skin at bedtime., Disp: , Rfl:  .  LANTUS SOLOSTAR 100 UNIT/ML Solostar Pen, Inject 45 Units into the skin. , Disp: , Rfl:  .  levothyroxine (SYNTHROID, LEVOTHROID) 112 MCG tablet, Take 112 mcg by mouth daily before breakfast., Disp: , Rfl:  .  Magnesium Oxide 400 MG CAPS, Take 1 capsule (400 mg total) by mouth daily., Disp: 90 capsule, Rfl: 3 .  Melatonin 3 MG TABS, Take 3 mg by mouth at bedtime., Disp: , Rfl:  .  metFORMIN (GLUCOPHAGE) 500 MG tablet, Take 1,000 mg by mouth 2 (two) times daily., Disp: , Rfl:  .  mometasone-formoterol (DULERA) 200-5 MCG/ACT AERO, Inhale 2 puffs into the lungs 2 (two) times daily. Use with spacer., Disp: 13 g, Rfl: 5 .  montelukast (SINGULAIR) 10 MG tablet, Take 1 tablet (10 mg total) by mouth at bedtime., Disp: 30 tablet, Rfl: 11 .  ondansetron (ZOFRAN) 4 MG tablet, Take 1 tablet (4 mg total) by mouth every 8 (eight) hours as needed for nausea or vomiting., Disp: 20 tablet, Rfl: 0 .  ONETOUCH ULTRA test strip, USE TO CHECK BLOOD SUGAR FOUR TIMES DAILY, Disp: , Rfl:  .  potassium chloride SA (KLOR-CON) 20 MEQ tablet, Take 20 mEq by mouth daily., Disp: , Rfl:  .  Tiotropium Bromide Monohydrate (SPIRIVA RESPIMAT) 1.25 MCG/ACT AERS, Inhale 2 puffs into the lungs daily., Disp: 4 g, Rfl: 5 .  torsemide (DEMADEX) 20 MG tablet, Take 60 mg daily, Disp: 90 tablet, Rfl: 3  Current Facility-Administered Medications:  .  Benralizumab SOSY 30 mg, 30 mg, Subcutaneous, Q28 days, Valentina Shaggy, MD, 30 mg at 02/21/20  1022  Allergies  Allergen Reactions  . Ativan [Lorazepam] Hives  . Phenergan [Promethazine Hcl] Hives   Review of Systems Objective:  There were no vitals filed for this visit.  General: Well developed, nourished, in no acute distress, alert and oriented x3   Dermatological: Skin is warm, dry and supple bilateral. Nails x 10 are well maintained; remaining integument appears unremarkable at this time. There are no open sores, no preulcerative lesions, no rash or  signs of infection present.  Vascular: Dorsalis Pedis artery and Posterior Tibial artery pedal pulses are 2/4 bilateral with immedate capillary fill time. Pedal hair growth present. No varicosities and no lower extremity edema present bilateral.   Neruologic: Grossly intact via light touch bilateral. Vibratory intact via tuning fork bilateral. Protective threshold with Semmes Wienstein monofilament intact to all pedal sites bilateral. Patellar and Achilles deep tendon reflexes 2+ bilateral. No Babinski or clonus noted bilateral.   Musculoskeletal: No gross boney pedal deformities bilateral. No pain, crepitus, or limitation noted with foot and ankle range of motion bilateral. Muscular strength 5/5 in all groups tested bilateral.  She has pain on palpation medial calcaneal tubercles bilaterally.  Less pain on palpation to the forefoot right.  She does have some hammertoe deformity present.  Gait: Unassisted, Nonantalgic.    Radiographs:  Radiographs taken demonstrate large plantar distally oriented calcaneal spurs proximally oriented calcaneal spurs with soft tissue increase in density at the plantar fascial kidney insertion site.  Assessment & Plan:   Assessment: Mild hammertoe deformity right foot.  Plantar fasciitis bilateral.  Forefoot pain to the right foot may be compensatory of the plantar fasciitis.  Plan: At this point discussed etiology pathology conservative versus surgical therapies injected the bilateral heels today  20 mg Kenalog 5 mg Marcaine point of maximal tenderness.  She tolerated procedure well without complications.  Started her on plantar fascial braces.  Discussed appropriate shoe gear stretching exercise ice therapy shoe gear modifications.     Jenisis Harmsen T. [place], [place]

## 2020-06-25 LAB — SPECIMEN STATUS REPORT

## 2020-06-25 LAB — SARS-COV-2 ANTIBODY, IGM: SARS-CoV-2 Antibody, IgM: NEGATIVE

## 2020-07-02 ENCOUNTER — Other Ambulatory Visit: Payer: Self-pay

## 2020-07-02 ENCOUNTER — Ambulatory Visit (INDEPENDENT_AMBULATORY_CARE_PROVIDER_SITE_OTHER): Payer: Medicare Other | Admitting: Clinical

## 2020-07-02 DIAGNOSIS — F4310 Post-traumatic stress disorder, unspecified: Secondary | ICD-10-CM

## 2020-07-02 NOTE — Patient Instructions (Addendum)
CVID on Cuvitru 20 grams every 2 weeks Continue Cuvitru at the same dose Lab order for IgG given. Will call with results when back  Non-allergic rhinitis Continue sinus rinses as needed Continue fluticasone nasal spray 1-2 sprays each nostril once a day as needed for stuffy nose. Continue over the counter antihistamine once a day as needed for runny nose  Severe persistent asthma complicated by bronchiectasis Start prednisone 10 mg take 1 tablet twice a day for 4 days, then 1 tablet once a day for 1 day then stop. Continue Fasenra injections.  Continue Symbicort 160/4.5 taking 2 puffs twice a day with spacer to help prevent cough and wheeze Continue montelukast 10 mg once a day to help prevent cough and wheeze. Continue Spiriva Respimat 1.25 mcg 2 puffs once a day to help prevent cough and wheeze May use ProAir 2 puffs every 4-6 hours as needed for cough, wheeze tightness in chest, or shortness of breath OR may use albuterol via nebulizer 1 unit dose every 4-6 hours as needed for cough, wheeze, tightness in chest, shortness of breath OR may use DuoNeb 1 unit dose via nebulizer every 4-6 hours as needed for cough, wheeze, tightness in chest, or shortness of breath. Get chest x-ray due to productive cough.We will call you with results.  Pulmonary nodules with history of bronchiectasis-recent CT chest on March 26, 2020 ordered by Dr. Margaretha Seeds Continue follow up with pulmonary  Refulx Start omeprazole 20 mg once a day for 1 month only.  Please let us know if this treatment plan is not working well for you To follow-up appointment in 3 months

## 2020-07-02 NOTE — Progress Notes (Signed)
Virtual Visit via Video Note  I connected withTamara [name] 09/23/21at 10:00 AM EDTby a video enabled telemedicine application and verified that I am speaking with the correct person using two identifiers.  Location: Patient:Home Provider:Office  I discussed the limitations of evaluation and management by telemedicine and the availability of in person appointments. The patient expressed understanding and agreed to proceed.     THERAPIST PROGRESS NOTE  Session Time:10:00AM-10:55AM  Participation Level:Active  Behavioral Response:CasualAlertDepressed  Type of Therapy:Individual Therapy  Treatment Goals addressed:Coping  Interventions:CBT, DBT, Solution Focused, Strength-based and Supportive  Summary:Laura Leppertis a 59 y.o.femalewho presents with PTSD.The OPT therapist worked with thepatientfor herongoing OPT treatmentsession. The OPT therapist utilized Motivational Interviewing to assist in creating therapeutic repore. The patient in the session was engaged and work in Science writer about hertriggers and symptoms over the past few weeksincluding being under the weather for the last week and being in the home without much community involvement.The OPT therapist utilized Cognitive Behavioral Therapy through cognitive restructuring as well as worked with the patient on coping strategies to assist in management ofmental healthsymptoms as well asongoingwork tochallenge current negative automatic thoughts and improve the patients thought processes.The OPT therapist continued to give support around being active, social, and positiveand acknowledged the primarily focus currently is getting health back to her baseline. The patient verified she does have plans for the upcoming weekend and will be getting out of the home as she noted she is feeling better, and going camping with her sister.  Suicidal/Homicidal:Nowithout  intent/plan  Therapist Response:The OPT therapist worked with the patient for the patients scheduled session. The patient was engaged in hersession and gave feedback in relation to triggers, symptoms, and behavior responses over the pastfewweeksas well as her adjustment inbeing back at home after traveling and then getting sick and being stuck in the home for the past week. The OPT therapist worked with the patient utilizing an in session Cognitive Behavioral Therapy exercise. The patient was responsive in the session and verbalized, " Iam feeling better, and I am planning on getting out and going camping with my sister and her husband if the weather is good this weekend".The OPT therapistcontinued in this session towork with the patient on challenging her negative automatic thoughts, continue to work on positive thinking, as well as self esteem and confidence .The OPT therapist will continue treatment work with the patient in hernext scheduled session  Plan: Return again in2/3weeks.  Diagnosis:Axis I:Post Traumatic Stress Disorder  Axis II:No diagnosis  I discussed the assessment and treatment plan with the patient. The patient was provided an opportunity to ask questions and all were answered. The patient agreed with the plan and demonstrated an understanding of the instructions.  The patient was advised to call back or seek an in-person evaluation if the symptoms worsen or if the condition fails to improve as anticipated.  I provided69minutes of non-face-to-face time during this encounter.  Laura Grumbles, LCSW 07/02/2020

## 2020-07-03 ENCOUNTER — Encounter: Payer: Self-pay | Admitting: Family

## 2020-07-03 ENCOUNTER — Other Ambulatory Visit (HOSPITAL_COMMUNITY)
Admission: RE | Admit: 2020-07-03 | Discharge: 2020-07-03 | Disposition: A | Discharge status: Home / Self Care | Payer: Medicare Other | Source: Ambulatory Visit | Attending: Family | Admitting: Family

## 2020-07-03 ENCOUNTER — Ambulatory Visit (INDEPENDENT_AMBULATORY_CARE_PROVIDER_SITE_OTHER): Payer: Medicare Other | Admitting: Family

## 2020-07-03 ENCOUNTER — Other Ambulatory Visit: Payer: Self-pay | Admitting: Family

## 2020-07-03 ENCOUNTER — Other Ambulatory Visit: Payer: Self-pay

## 2020-07-03 ENCOUNTER — Ambulatory Visit (HOSPITAL_COMMUNITY)
Admission: RE | Admit: 2020-07-03 | Discharge: 2020-07-03 | Disposition: A | Discharge status: Home / Self Care | Payer: Medicare Other | Source: Ambulatory Visit | Attending: Family | Admitting: Family

## 2020-07-03 VITALS — BP 138/70 | HR 76 | Temp 97.8°F | Resp 18 | Ht 64.0 in

## 2020-07-03 DIAGNOSIS — J90 Pleural effusion, not elsewhere classified: Secondary | ICD-10-CM | POA: Diagnosis not present

## 2020-07-03 DIAGNOSIS — J4550 Severe persistent asthma, uncomplicated: Secondary | ICD-10-CM | POA: Diagnosis not present

## 2020-07-03 DIAGNOSIS — D839 Common variable immunodeficiency, unspecified: Secondary | ICD-10-CM | POA: Insufficient documentation

## 2020-07-03 DIAGNOSIS — J310 Chronic rhinitis: Secondary | ICD-10-CM

## 2020-07-03 DIAGNOSIS — J479 Bronchiectasis, uncomplicated: Secondary | ICD-10-CM

## 2020-07-03 DIAGNOSIS — R918 Other nonspecific abnormal finding of lung field: Secondary | ICD-10-CM

## 2020-07-03 DIAGNOSIS — R059 Cough, unspecified: Secondary | ICD-10-CM

## 2020-07-03 DIAGNOSIS — R05 Cough: Secondary | ICD-10-CM | POA: Insufficient documentation

## 2020-07-03 IMAGING — DX DG CHEST 2V
2 series · 2 of 2 positions shown · non-contrast
Comparison: [DATE]

CLINICAL DATA: Productive cough for 2 weeks

EXAM:
CHEST - 2 VIEW

[chest pa]
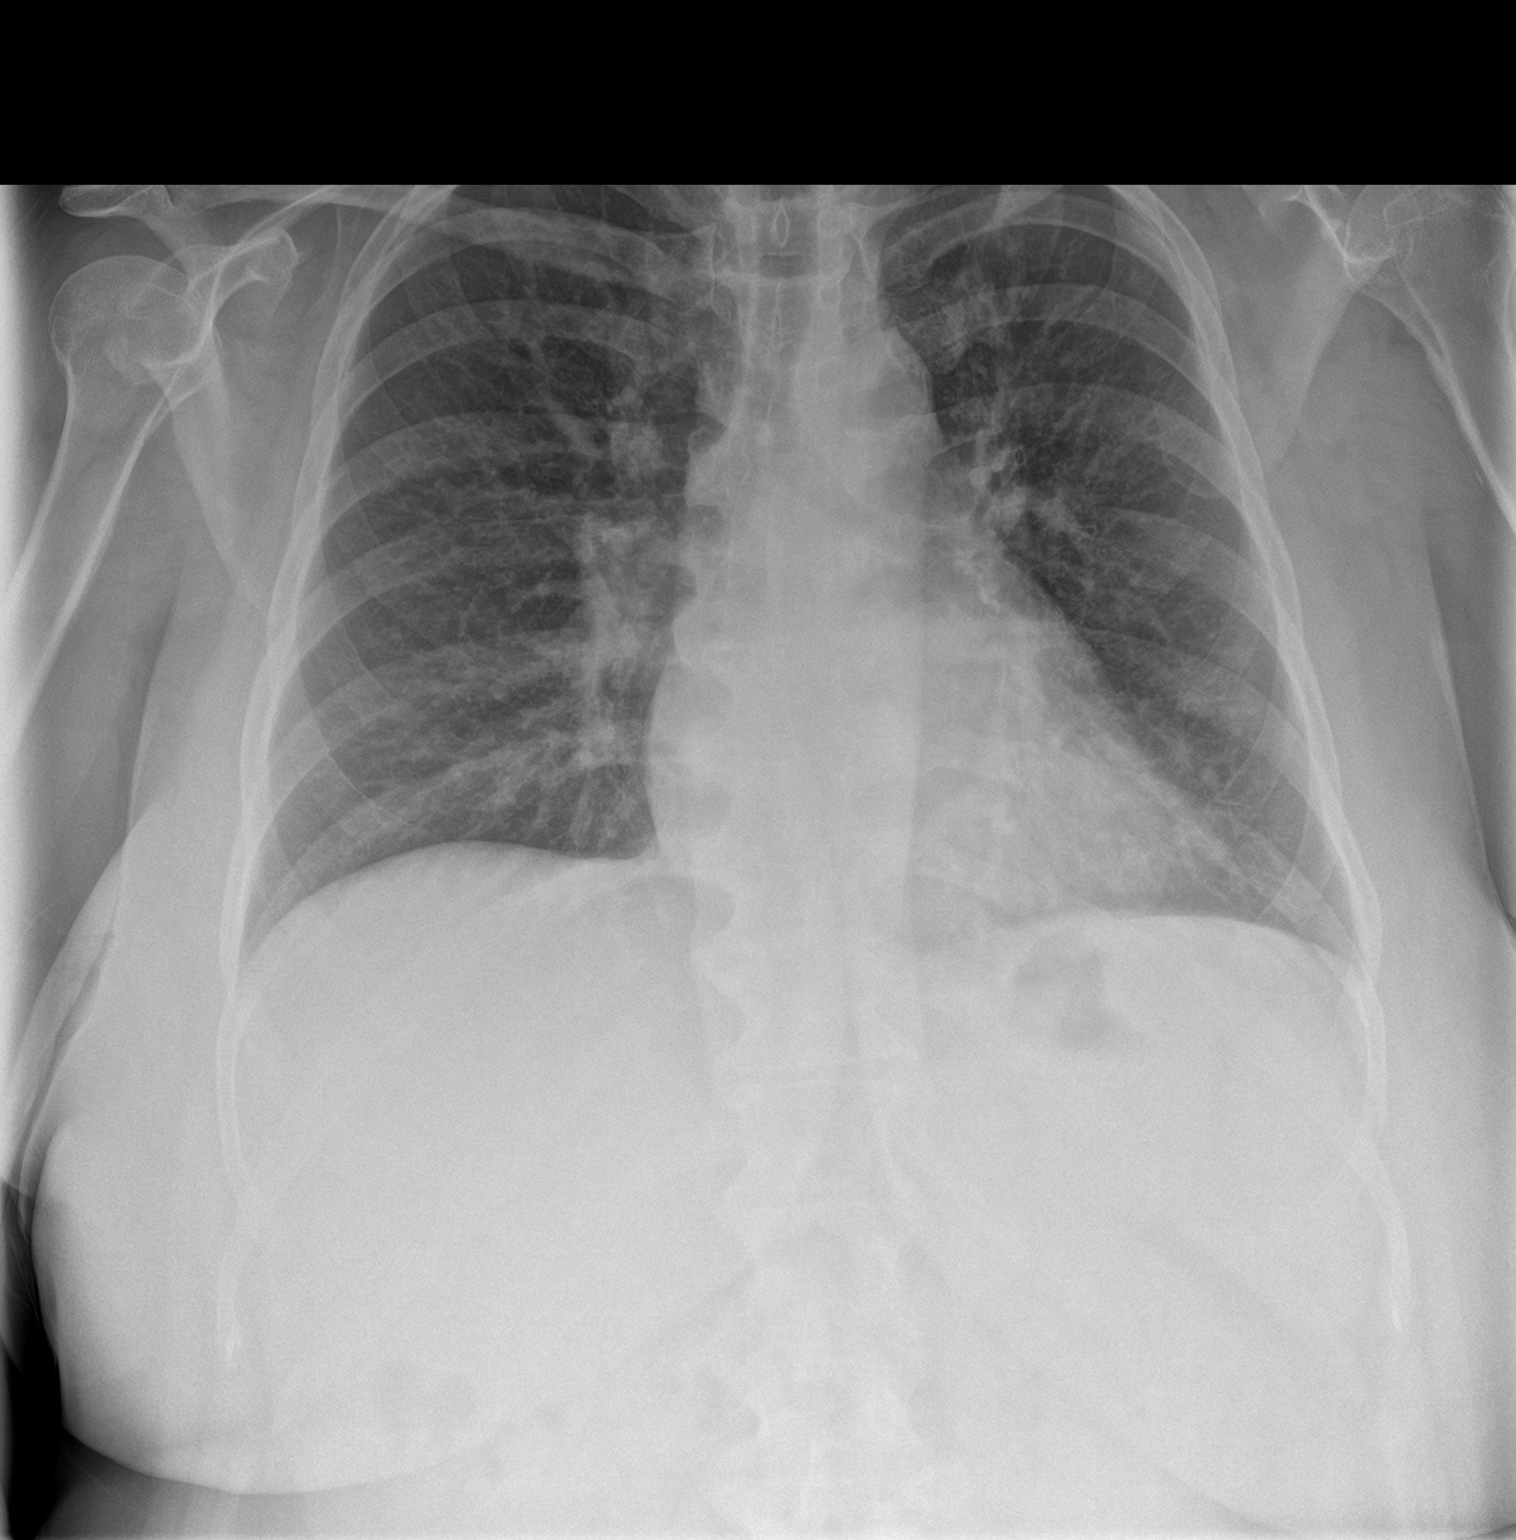

[chest lat]
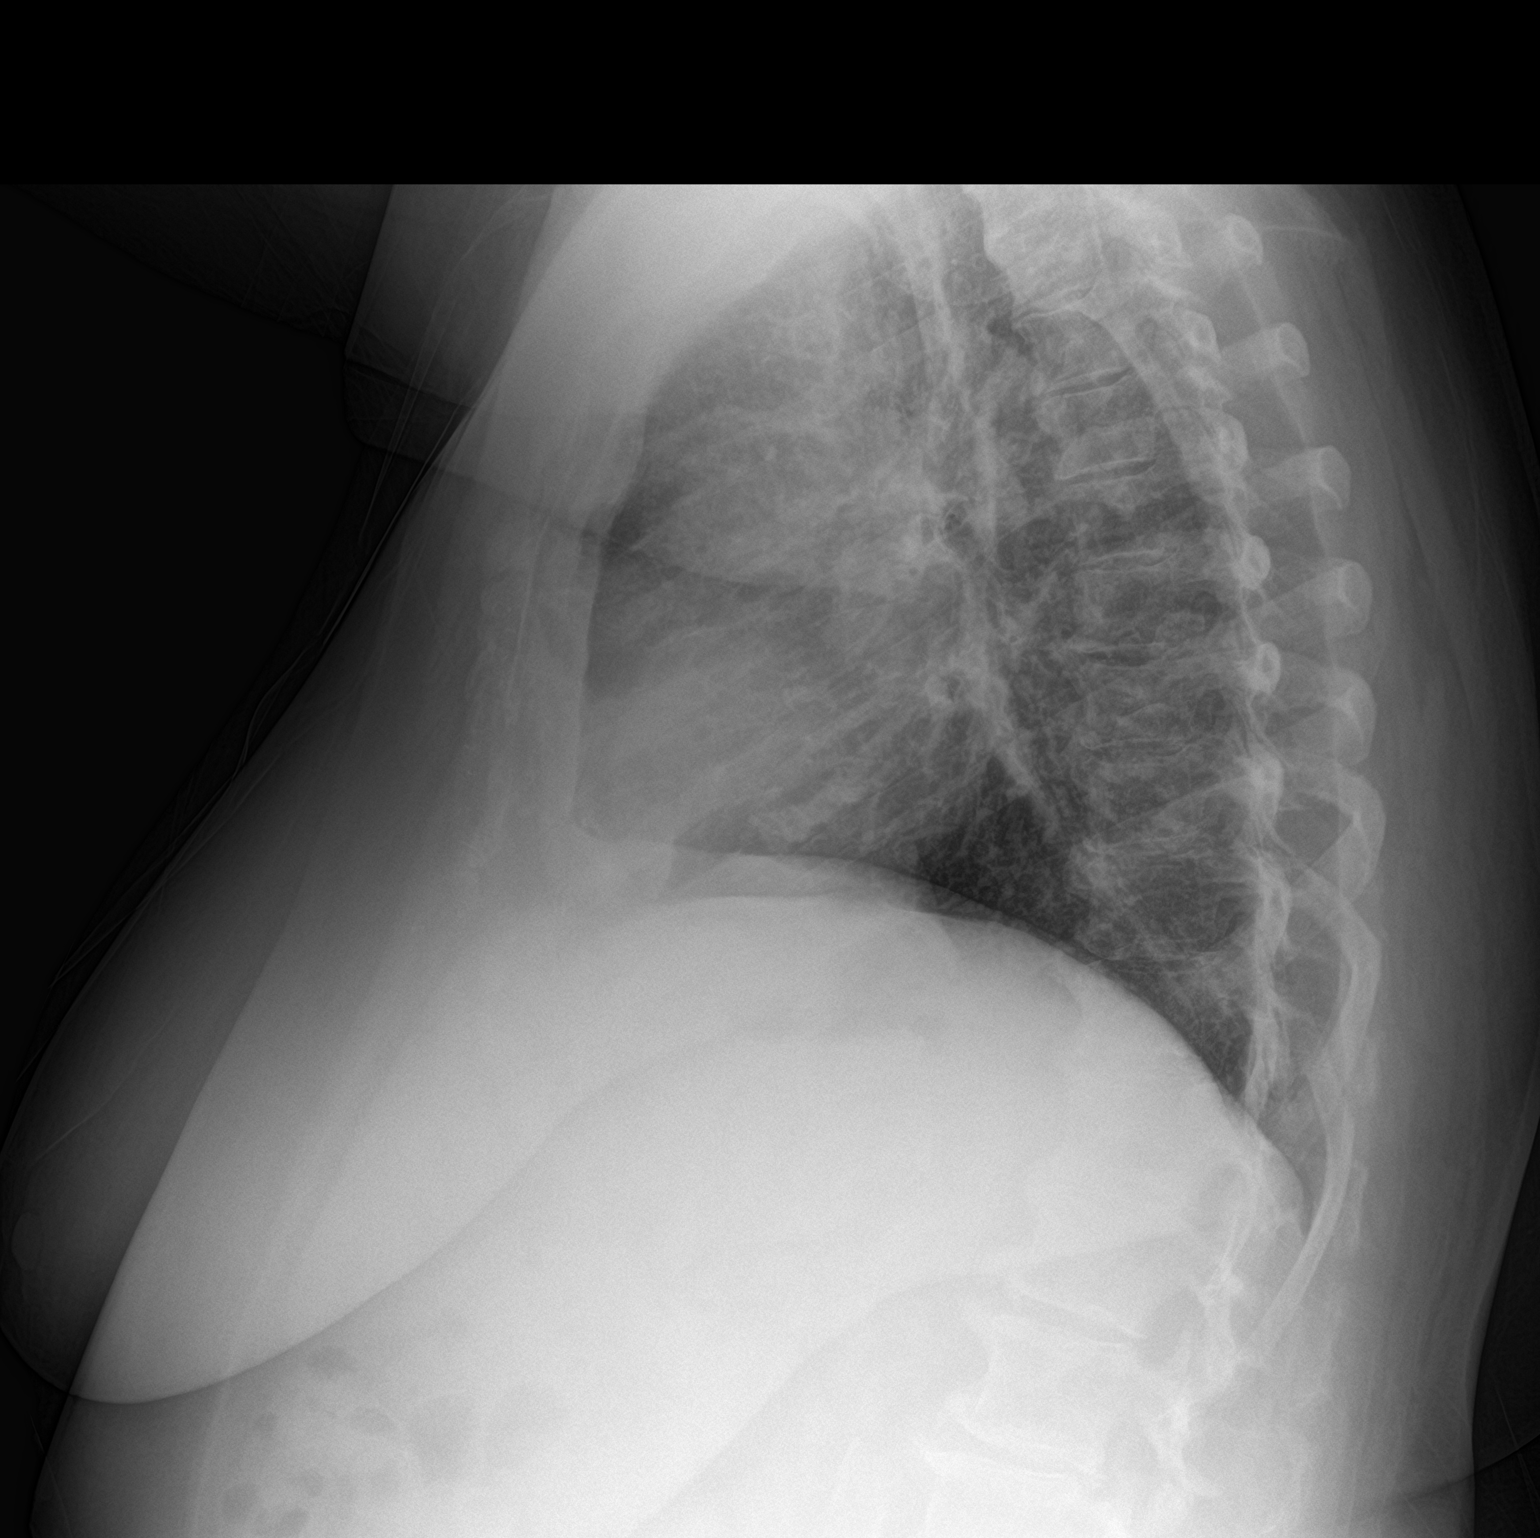

[2 of 2 positions shown; findings below may reference images not displayed]

FINDINGS: Frontal and lateral views of the chest demonstrate a stable cardiac
silhouette. Chronic areas of scarring without airspace disease,
effusion, or pneumothorax. No acute bony abnormalities.
IMPRESSION: 1. Stable exam, no acute process.

## 2020-07-03 MED ORDER — OMEPRAZOLE 20 MG PO CPDR: 20.0000 mg | DELAYED_RELEASE_CAPSULE | Freq: Every day | ORAL | 0 refills | Status: DC

## 2020-07-03 NOTE — Progress Notes (Signed)
[place], [place] [address] Dept: [phone]  FOLLOW UP NOTE  Patient ID: Laura [name], female    DOB: 17-Jul-1961  Age: 59 y.o. MRN: [id number] Date of Office Visit: 07/03/2020  Assessment  Chief Complaint: Follow-up and Asthma  HPI Laura [name] is a 58 year old female who presents today for follow-up of common variable immune deficiency, nonallergic rhinitis, and severe persistent asthma complicated by bronchiectasis.  She was last seen on Feb 21, 2020 by Dr. Ernst Bowler.  She continues on Cuvitru 20 g infusions every 2 weeks.  She reports that her last infusion was this past Monday.  She does mention that she does have bruising at infusion sites.  She does take Eliquis for her proximal atrial fibrillation.  Since her last office visit she has had 1 sinus infection. Approximately 3 weeks ago she came into direct contact with her nephew who tested Covid positive.  2 days after being around him she began to develop a loss of  of taste and smell, rhinorrhea, nasal congestion, nausea, vomiting and diarrhea.  It was during this time that she had the sinus infection and was given doxycycline, a Z-Pak pack, and 2 rounds of steroids.  She reports that she took four Covid tests and that they were all negative.  Today she reports that she feels 70% better and is just tired and still nauseous.  She is taking Zofran for the nausea. She has had no other infections  Severe persistent asthma is reported as not well controlled with Symbicort 160/4.5 2 puffs twice a day with spacer, Spiriva Respimat 1.25 mg 2 puffs once a day, Fasenra injections, and albuterol as needed.  Her last Berna Bue injection was July 9 and she was due this month, but did not take due to being sick.  She reports a productive cough that is at times a dark green and other times clear.  She also reports that her chest feels heavy, tightness in her chest, shortness of breath with exertion, and wheezing.  She denies  any fevers or chills.  She has been using her albuterol approximately 2 times a day.  She had a chest x-ray on June 18, 2020 that showed "chronic lung changes but no acute pulmonary findings."  She also had a CT of the chest on March 25, 2020 that was ordered by Dr. Loanne Drilling, pulmonary, and showed"1. no suspicious appearing pulmonary nodules or masses are noted. 2. Pericardial calcification.  Outpatient evaluation with echocardiography could be considered the possibility of constrictive physiology if clinically appropriate.3.  Aortic atherosclerosis."  She continues to follow-up with pulmonary.  Chronic rhinitis is reported as moderately controlled with Flonase 1 spray each nostril once a day, montelukast 10 mg once a day, an over-the-counter antihistamine, and sinus rinse as needed.  She reports a small amount of clear rhinorrhea and some postnasal drip.  She also reports some sinus tenderness, but this is much better than what it was previously.  She denies any nasal congestion.  She continues to follow Dr. Bronson Ing for paroxysmal atrial fibrillation and congestive heart failure.  Current medications are as listed in the chart.  Drug Allergies:  Allergies  Allergen Reactions  . Ativan [Lorazepam] Hives  . Phenergan [Promethazine Hcl] Hives    Review of Systems: Review of Systems  Constitutional: Negative for chills and fever.  HENT:       Reports clear rhinorrhea and post nasal drip. She also reports a little bit of sinus tenderness. She denies any nasal congestion  Eyes:       Reports occasional itchy watery eyes for which her eye drops help  Respiratory: Positive for cough, shortness of breath and wheezing.   Cardiovascular: Negative for chest pain and palpitations.  Gastrointestinal: Negative for abdominal pain and heartburn.  Genitourinary: Negative for dysuria.  Skin: Positive for itching. Negative for rash.       Reports itchy area on right upper thigh for past couple monts   Neurological: Negative for headaches.  Endo/Heme/Allergies: Negative for environmental allergies.    Physical Exam: BP 138/70   Pulse 76   Temp 97.8 F (36.6 C) (Temporal)   Resp 18   Ht 5\' 4"  (1.626 m)   SpO2 98%   BMI 39.14 kg/m    Physical Exam Constitutional:      Appearance: Normal appearance.  HENT:     Head: Normocephalic and atraumatic.     Comments: Pharynx normal. Eyes normal. Ears normal. Nose normal    Right Ear: Tympanic membrane, ear canal and external ear normal.     Left Ear: Tympanic membrane, ear canal and external ear normal.     Nose: Nose normal.     Mouth/Throat:     Mouth: Mucous membranes are moist.     Pharynx: Oropharynx is clear.  Eyes:     Conjunctiva/sclera: Conjunctivae normal.  Cardiovascular:     Rate and Rhythm: Normal rate and regular rhythm.     Heart sounds: Normal heart sounds.  Pulmonary:     Effort: Pulmonary effort is normal.     Comments: Lungs clear to auscultation in upper lobes. Expiratory wheezing noted in bilateral lower lobes Musculoskeletal:     Cervical back: Neck supple.  Skin:    General: Skin is warm.     Comments: Dime sized patch of dry skin noted on right upper thigh  Neurological:     Mental Status: She is alert and oriented to person, place, and time.  Psychiatric:        Mood and Affect: Mood normal.        Behavior: Behavior normal.        Thought Content: Thought content normal.        Judgment: Judgment normal.     Diagnostics: FVC 1.95 L, FEV1 1.32 L.  Predicted FVC 3.38 L, FEV1 2.63 L.  Spirometry indicates moderate restriction and mild airway obstruction.  Post bronchodilator response shows FVC 1.98 L, FEV1 1.37 L.  Spirometry indicates moderate restriction with no significant bronchodilator response.  Assessment and Plan: 1. Severe persistent asthma without complication   2. 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 Non-allergic rhinitis   4. Cough   5. Bronchiectasis without complication  ([place])   6. Pulmonary nodules     Meds ordered this encounter  Medications  . omeprazole (PRILOSEC) 20 MG capsule    Sig: Take 1 capsule (20 mg total) by mouth daily.    Dispense:  30 capsule    Refill:  0    Patient Instructions  CVID on Cuvitru 20 grams every 2 weeks Continue Cuvitru at the same dose Lab order for IgG given. Will call with results when back  Non-allergic rhinitis Continue sinus rinses as needed Continue fluticasone nasal spray 1-2 sprays each nostril once a day as needed for stuffy nose. Continue over the counter antihistamine once a day as needed for runny nose  Severe persistent asthma complicated by bronchiectasis Start prednisone 10 mg take 1 tablet twice a day for 4 days, then 1  tablet once a day for 1 day then stop. Continue Fasenra injections.  Continue Symbicort 160/4.5 taking 2 puffs twice a day with spacer to help prevent cough and wheeze Continue montelukast 10 mg once a day to help prevent cough and wheeze. Continue Spiriva Respimat 1.25 mcg 2 puffs once a day to help prevent cough and wheeze May use ProAir 2 puffs every 4-6 hours as needed for cough, wheeze tightness in chest, or shortness of breath OR may use albuterol via nebulizer 1 unit dose every 4-6 hours as needed for cough, wheeze, tightness in chest, shortness of breath OR may use DuoNeb 1 unit dose via nebulizer every 4-6 hours as needed for cough, wheeze, tightness in chest, or shortness of breath. Get chest x-ray due to productive cough.We will call you with results.  Pulmonary nodules with history of bronchiectasis-recent CT chest on March 26, 2020 ordered by Dr. Margaretha Seeds Continue follow up with pulmonary  Refulx Start omeprazole 20 mg once a day for 1 month only.  Please let us know if this treatment plan is not working well for you To follow-up appointment in 3 months    Return in about 3 months (around 10/02/2020), or if symptoms worsen or fail to improve.    Thank you  for the opportunity to care for this patient.  Please do not hesitate to contact me with questions.  Althea Charon, FNP [facility]

## 2020-07-05 LAB — IGG: IgG (Immunoglobin G), Serum: 1213 mg/dL (ref 586–1602)

## 2020-07-06 NOTE — Progress Notes (Signed)
Please let Laura [name] know that her chest x-ray is stable and that it shows no acute process.  Thank you!

## 2020-07-06 NOTE — Progress Notes (Signed)
Please let Laura [name] know that her IgG looks good at 1213.

## 2020-07-08 DIAGNOSIS — M25562 Pain in left knee: Secondary | ICD-10-CM | POA: Diagnosis not present

## 2020-07-08 DIAGNOSIS — D803 Selective deficiency of immunoglobulin G [IgG] subclasses: Secondary | ICD-10-CM | POA: Diagnosis not present

## 2020-07-08 DIAGNOSIS — R05 Cough: Secondary | ICD-10-CM | POA: Diagnosis not present

## 2020-07-08 DIAGNOSIS — F419 Anxiety disorder, unspecified: Secondary | ICD-10-CM | POA: Diagnosis not present

## 2020-07-08 DIAGNOSIS — Z20822 Contact with and (suspected) exposure to covid-19: Secondary | ICD-10-CM | POA: Diagnosis not present

## 2020-07-08 DIAGNOSIS — R197 Diarrhea, unspecified: Secondary | ICD-10-CM | POA: Diagnosis not present

## 2020-07-08 DIAGNOSIS — F319 Bipolar disorder, unspecified: Secondary | ICD-10-CM | POA: Diagnosis not present

## 2020-07-08 DIAGNOSIS — I509 Heart failure, unspecified: Secondary | ICD-10-CM | POA: Diagnosis not present

## 2020-07-08 DIAGNOSIS — G4762 Sleep related leg cramps: Secondary | ICD-10-CM | POA: Diagnosis not present

## 2020-07-08 DIAGNOSIS — J45909 Unspecified asthma, uncomplicated: Secondary | ICD-10-CM | POA: Diagnosis not present

## 2020-07-08 DIAGNOSIS — R252 Cramp and spasm: Secondary | ICD-10-CM | POA: Diagnosis not present

## 2020-07-08 DIAGNOSIS — G9009 Other idiopathic peripheral autonomic neuropathy: Secondary | ICD-10-CM | POA: Diagnosis not present

## 2020-07-08 DIAGNOSIS — R944 Abnormal results of kidney function studies: Secondary | ICD-10-CM | POA: Diagnosis not present

## 2020-07-23 ENCOUNTER — Ambulatory Visit (INDEPENDENT_AMBULATORY_CARE_PROVIDER_SITE_OTHER): Payer: Medicare Other | Admitting: Clinical

## 2020-07-23 ENCOUNTER — Other Ambulatory Visit: Payer: Self-pay

## 2020-07-23 DIAGNOSIS — F4310 Post-traumatic stress disorder, unspecified: Secondary | ICD-10-CM | POA: Diagnosis not present

## 2020-07-23 NOTE — Progress Notes (Signed)
  Virtual Visit via Video Note  I connected withTamara [name] 10/14/21at1:00 PM EDTby a video enabled telemedicine application and verified that I am speaking with the correct person using two identifiers.  Location: Patient:Home Provider:Office  I discussed the limitations of evaluation and management by telemedicine and the availability of in person appointments. The patient expressed understanding and agreed to proceed.     THERAPIST PROGRESS NOTE  Session Time:1:00PM-1:55PM  Participation Level:Active  Behavioral Response:CasualAlertDepressed  Type of Therapy:Individual Therapy  Treatment Goals addressed:Coping  Interventions:CBT, DBT, Solution Focused, Strength-based and Supportive  Summary:Laura Leppertis a 59 y.o.femalewho presents with PTSD.The OPT therapist worked with thepatientfor herongoing OPT treatmentsession. The OPT therapist utilized Motivational Interviewing to assist in creating therapeutic repore. The patient in the session was engaged and work in Science writer about hertriggers and symptoms over the past few weeksincluding conflict with her daughters.The OPT therapist utilized Cognitive Behavioral Therapy through cognitive restructuring as well as worked with the patient on coping strategies to assist in management ofmental healthsymptoms as well asongoingwork tochallenge current negative automatic thoughts and improve the patients thought processes.The OPT therapist continued to give support around being active, social, and positiveandacknowledged the primarily focus currently is getting health back to her baseline. The patient verified she does have plans for the upcoming weekend and going camping.  Suicidal/Homicidal:Nowithout intent/plan  Therapist Response:The OPT therapist worked with the patient for the patients scheduled session. The patient was engaged in hersession and gave feedback in  relation to triggers, symptoms, and behavior responses over the pastfewweeks. The OPT therapist worked with the patient utilizing an in session Cognitive Behavioral Therapy exercise. The patient was responsive in the session and verbalized, " Iam going to try and stay active and have things planned that I can look forward too".The OPT therapistcontinued in this session towork with the patient on challenging her negative automatic thoughts, continue to work on positive thinking, as well as self esteem and confidence .The OPT therapist will continue treatment work with the patient in hernext scheduled session  Plan: Return again in2/3weeks.  Diagnosis:Axis I:Post Traumatic Stress Disorder  Axis II:No diagnosis  I discussed the assessment and treatment plan with the patient. The patient was provided an opportunity to ask questions and all were answered. The patient agreed with the plan and demonstrated an understanding of the instructions.  The patient was advised to call back or seek an in-person evaluation if the symptoms worsen or if the condition fails to improve as anticipated.  I provided62minutes of non-face-to-face time during this encounter.  Laura Grumbles, LCSW 07/23/2020
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
## 2020-07-31 ENCOUNTER — Encounter (HOSPITAL_COMMUNITY): Payer: Self-pay | Admitting: Psychiatry

## 2020-07-31 ENCOUNTER — Telehealth (INDEPENDENT_AMBULATORY_CARE_PROVIDER_SITE_OTHER): Payer: Medicare Other | Admitting: Psychiatry

## 2020-07-31 ENCOUNTER — Other Ambulatory Visit: Payer: Self-pay

## 2020-07-31 DIAGNOSIS — F330 Major depressive disorder, recurrent, mild: Secondary | ICD-10-CM

## 2020-07-31 MED ORDER — DIVALPROEX SODIUM ER 500 MG PO TB24: ORAL_TABLET | ORAL | 0 refills | Status: DC

## 2020-07-31 NOTE — Patient Instructions (Signed)
1.ContinueDepakote500 mg in AM, 1000 mg at night 2.Next appointment:  1/21 at 8:50 3. Obtain blood test

## 2020-08-04 ENCOUNTER — Ambulatory Visit: Payer: Medicare Other | Admitting: Podiatry

## 2020-08-06 ENCOUNTER — Ambulatory Visit (HOSPITAL_COMMUNITY): Payer: Medicare Other | Admitting: Clinical

## 2020-08-11 ENCOUNTER — Encounter: Payer: Self-pay | Admitting: Neurology

## 2020-08-13 ENCOUNTER — Other Ambulatory Visit: Payer: Self-pay

## 2020-08-13 ENCOUNTER — Other Ambulatory Visit (HOSPITAL_COMMUNITY)
Admission: RE | Admit: 2020-08-13 | Discharge: 2020-08-13 | Disposition: A | Discharge status: Home / Self Care | Payer: Medicare Other | Source: Ambulatory Visit | Attending: Psychiatry | Admitting: Psychiatry

## 2020-08-13 ENCOUNTER — Encounter: Payer: Self-pay | Admitting: Psychiatry

## 2020-08-13 DIAGNOSIS — F330 Major depressive disorder, recurrent, mild: Secondary | ICD-10-CM

## 2020-08-13 LAB — COMPREHENSIVE METABOLIC PANEL
ALT: 14 U/L (ref 0–44)
AST: 16 U/L (ref 15–41)
Albumin: 3.8 g/dL (ref 3.5–5.0)
Alkaline Phosphatase: 60 U/L (ref 38–126)
Anion gap: 9 (ref 5–15)
BUN: 15 mg/dL (ref 6–20)
CO2: 30 mmol/L (ref 22–32)
Calcium: 9.4 mg/dL (ref 8.9–10.3)
Chloride: 98 mmol/L (ref 98–111)
Creatinine, Ser: 0.76 mg/dL (ref 0.44–1.00)
GFR, Estimated: >60 mL/min (ref >60)
Glucose, Bld: 149 mg/dL — ABNORMAL HIGH (ref 70–99)
Potassium: 4.5 mmol/L (ref 3.5–5.1)
Sodium: 137 mmol/L (ref 135–145)
Total Bilirubin: 0.5 mg/dL (ref 0.3–1.2)
Total Protein: 7.2 g/dL (ref 6.5–8.1)

## 2020-08-13 LAB — CBC
HCT: 39.3 % (ref 36.0–46.0)
Hemoglobin: 13.7 g/dL (ref 12.0–15.0)
MCH: 33 pg (ref 26.0–34.0)
MCHC: 34.9 g/dL (ref 30.0–36.0)
MCV: 94.7 fL (ref 80.0–100.0)
Platelets: 217 10*3/uL (ref 150–400)
RBC: 4.15 MIL/uL (ref 3.87–5.11)
RDW: 12.6 % (ref 11.5–15.5)
WBC: 6.1 10*3/uL (ref 4.0–10.5)
nRBC: 0 % (ref 0.0–0.2)

## 2020-08-13 LAB — VALPROIC ACID LEVEL: Valproic Acid Lvl: 62 ug/mL (ref 50.0–100.0)

## 2020-08-17 DIAGNOSIS — M545 Low back pain, unspecified: Secondary | ICD-10-CM | POA: Diagnosis not present

## 2020-08-17 DIAGNOSIS — M25512 Pain in left shoulder: Secondary | ICD-10-CM | POA: Diagnosis not present

## 2020-08-19 DIAGNOSIS — M9901 Segmental and somatic dysfunction of cervical region: Secondary | ICD-10-CM | POA: Diagnosis not present

## 2020-08-19 DIAGNOSIS — M546 Pain in thoracic spine: Secondary | ICD-10-CM | POA: Diagnosis not present

## 2020-08-19 DIAGNOSIS — S39012A Strain of muscle, fascia and tendon of lower back, initial encounter: Secondary | ICD-10-CM | POA: Diagnosis not present

## 2020-08-19 DIAGNOSIS — M9903 Segmental and somatic dysfunction of lumbar region: Secondary | ICD-10-CM | POA: Diagnosis not present

## 2020-08-19 DIAGNOSIS — M9902 Segmental and somatic dysfunction of thoracic region: Secondary | ICD-10-CM | POA: Diagnosis not present

## 2020-08-20 ENCOUNTER — Encounter: Payer: Self-pay | Admitting: Podiatry

## 2020-08-20 ENCOUNTER — Ambulatory Visit (INDEPENDENT_AMBULATORY_CARE_PROVIDER_SITE_OTHER): Payer: Medicare Other | Admitting: Podiatry

## 2020-08-20 ENCOUNTER — Other Ambulatory Visit: Payer: Self-pay

## 2020-08-20 DIAGNOSIS — M778 Other enthesopathies, not elsewhere classified: Secondary | ICD-10-CM

## 2020-08-20 DIAGNOSIS — M722 Plantar fascial fibromatosis: Secondary | ICD-10-CM | POA: Diagnosis not present

## 2020-08-21 ENCOUNTER — Ambulatory Visit (HOSPITAL_COMMUNITY): Payer: Medicare Other | Admitting: Clinical

## 2020-08-21 DIAGNOSIS — M9901 Segmental and somatic dysfunction of cervical region: Secondary | ICD-10-CM | POA: Diagnosis not present

## 2020-08-21 DIAGNOSIS — M546 Pain in thoracic spine: Secondary | ICD-10-CM | POA: Diagnosis not present

## 2020-08-21 DIAGNOSIS — M9902 Segmental and somatic dysfunction of thoracic region: Secondary | ICD-10-CM | POA: Diagnosis not present

## 2020-08-21 DIAGNOSIS — S39012A Strain of muscle, fascia and tendon of lower back, initial encounter: Secondary | ICD-10-CM | POA: Diagnosis not present

## 2020-08-21 DIAGNOSIS — M9903 Segmental and somatic dysfunction of lumbar region: Secondary | ICD-10-CM | POA: Diagnosis not present

## 2020-08-21 NOTE — Progress Notes (Signed)
Assessment/Plan:   1.  Tremor.  -I agree with Dr. Rexene Alberts that, at the very least, this is exacerbated by her medications, if not caused by them.  One cannot diagnose essential tremor when a patient is on medications that induce tremor.  She is on both psychiatry medicines (Depakote) as well as asthma medications that induce tremor.  In my experience, it is very difficult to try to give patient's medication to control tremor when they are on tremor inducing medications.  In her, this issue is made even more difficult by the fact that she is probably limited on beta-blockers by her history of depression and asthma and cannot have primidone because of interaction with Eliquis.  That really leaves topiramate as the only other first-line medication.  She and I discussed this in detail.  She would like to try topiramate.  We discussed risk, benefits, and side effects.  We will start with 25 mg and slowly work up to 100 mg daily.  2.  Neuropathy per pt  -pt asked me about this as I was walking out exam.  Nothing in referral about it but didn't note on exam.  Nonetheless, discussed with patient that this isn't generally something I deal with in my movement practice.  However, I did tell her that topamax can help with that and she is going to see how she does.   Subjective:   Laura [name] was seen in consultation in the movement disorder clinic at the request of Pablo Lawrence, NP.  The evaluation is for tremor.  Patient previously saw Dr. Rexene Alberts for the same.  Notes from Dr. Rexene Alberts are reviewed, as well as notes from primary care and psychiatry.  Patient last saw Dr. Rexene Alberts in March, 2021.  When she initially saw Dr. 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  Abilify was discontinued and that aspect improved.  When she followed up in March, 2021 it was felt that she still had some tremor, likely due to continued drug exposure (Depakote) as well as asthma  medications.  No further treatments were recommended, due to the fact that she was not a beta-blocker candidate with her history of depression and asthma, and she was also on Eliquis.  Psychiatry records indicate that they have tried to start tapering down Depakote because of tremor.   Tremor started approximately 5 years ago and involves the bilateral UE, R>L.  She is R hand dominant.  Tremor is most noticeable when carrying things.   There is no family hx of tremor.    Affected by caffeine:  No. (drinks 1 cup coffee/day) Affected by alcohol:  Doesn't drink alcohol Affected by stress:  Yes.   Affected by fatigue:  No. Spills soup if on spoon:  Yes.   Spills glass of liquid if full:  Yes.   Affects ADL's (tying shoes, brushing teeth, etc):  No. but putting on makeup is difficult  Current/Previously tried tremor medications: (Not candidate for beta-blocker because of significant asthma/depression; not candidate for primidone because of Eliquis)  Current medications that may exacerbate tremor:  Depakote (been on many years), Spiriva, Dulera previously - now symbicort; singulair  Past psychiatric meds:  lithium, carbamazepine, risperidone,Geodon, olanzapine, quetiapine,Abilify, Ingrezza  Outside reports reviewed: historical medical records, office notes and referral letter/letters.  Allergies  Allergen Reactions  . Ativan [Lorazepam] Hives  . Phenergan [Promethazine Hcl] Hives    Current Outpatient Medications  Medication Instructions  . acetaminophen (TYLENOL) 650 mg, Oral, Every 6  hours PRN  . albuterol (VENTOLIN HFA) 108 (90 Base) MCG/ACT inhaler INHALE 2 PUFFS INTO THE LUNGS EVERY 6 HOURS AS NEEDED FOR WHEEZING OR SHORTNESS OF BREATH  . apixaban (ELIQUIS) 5 mg, Oral, 2 times daily  . atorvastatin (LIPITOR) 10 mg, Oral, Every evening  . BD PEN NEEDLE NANO 2ND GEN 32G X 4 MM MISC SMARTSIG:Injection 4 Times Daily  . budesonide-formoterol (SYMBICORT) 160-4.5 MCG/ACT inhaler 2 puffs,  Inhalation, 2 times daily  . diltiazem (CARDIZEM CD) 360 mg, Oral, Daily  . divalproex (DEPAKOTE ER) 500 MG 24 hr tablet 500 mg in AM, 1000 mg at night  . Dulaglutide (TRULICITY) 1.5 mg, Subcutaneous, Every Sun  . EPINEPHrine 0.3 mg/0.3 mL IJ SOAJ injection No dose, route, or frequency recorded.  Berna Bue Pen 30 mg, Subcutaneous, Every 8 weeks  . gabapentin (NEURONTIN) 300 mg, Oral, 3 times daily  . HumaLOG KwikPen 7 Units, Subcutaneous, 3 times daily, Sliding scale.  . hydrALAZINE (APRESOLINE) 25 mg, Oral, 3 times daily  . hydrOXYzine (ATARAX/VISTARIL) 25 mg, Oral, Every 6 hours PRN  . Immune Globulin, Human, (CUVITRU) 4 GM/20ML SOLN 100 mLs, Subcutaneous, Every 14 days  . LANTUS SOLOSTAR 100 UNIT/ML Solostar Pen Inject 45 Units into the skin.   Marland Kitchen levothyroxine (SYNTHROID) 112 mcg, Oral, Daily before breakfast  . Magnesium Oxide 400 mg, Oral, Daily  . melatonin 3 mg, Oral, Daily at bedtime  . metFORMIN (GLUCOPHAGE) 1,000 mg, Oral, 2 times daily  . montelukast (SINGULAIR) 10 mg, Oral, Daily at bedtime  . omeprazole (PRILOSEC) 20 MG capsule TAKE 1 CAPSULE(20 MG) BY MOUTH DAILY  . ondansetron (ZOFRAN) 4 mg, Oral, Every 8 hours PRN  . ONETOUCH ULTRA test strip USE TO CHECK BLOOD SUGAR FOUR TIMES DAILY  . potassium chloride SA (KLOR-CON) 20 MEQ tablet 20 mEq, Oral, Daily  . Tiotropium Bromide Monohydrate (SPIRIVA RESPIMAT) 1.25 MCG/ACT AERS 2 puffs, Inhalation, Daily  . torsemide (DEMADEX) 20 MG tablet Take 60 mg daily     Objective:   VITALS:   Vitals:   08/26/20 1015  BP: 125/78  Pulse: (!) 102  SpO2: 97%  Weight: 222 lb (100.7 kg)  Height: 5\' 4"  (1.626 m)   Gen:  Appears stated age and in NAD. HEENT:  Normocephalic, atraumatic. The mucous membranes are moist. The superficial temporal arteries are without ropiness or tenderness. Cardiovascular: Regular rate and rhythm. Lungs: Clear to auscultation bilaterally. Neck: There are no carotid bruits noted  bilaterally.  NEUROLOGICAL:  Orientation:  The patient is alert and oriented x 3.   Cranial nerves: There is good facial symmetry. Extraocular muscles are intact and visual fields are full to confrontational testing. Speech is fluent and clear. Soft palate rises symmetrically and there is no tongue deviation. Hearing is intact to conversational tone. Tone: Tone is good throughout. Sensation: Sensation is intact to light touch touch throughout (facial, trunk, extremities). Vibration is intact at the bilateral big toe. There is no extinction with double simultaneous stimulation. There is no sensory dermatomal level identified. Coordination:  The patient has no dysdiadichokinesia or dysmetria. Motor: Strength is 5/5 in the bilateral upper and lower extremities.  Shoulder shrug is equal bilaterally.  There is no pronator drift.  There are no fasciculations noted. DTR's: Deep tendon reflexes are 0-1/4 at the bilateral biceps, triceps, brachioradialis, patella and achilles.  Plantar responses are downgoing bilaterally. Gait and Station: The patient is able to ambulate without difficulty with good arm swing.   MOVEMENT EXAM: Tremor:  There is mild tremor  in the UE, noted most significantly with action.  There is some entrainment.  The patient is able to draw Archimedes spirals without significant difficulty but tremor is evident.  There is no tremor at rest.   I have reviewed and interpreted the following labs independently   Chemistry      Component Value Date/Time   NA 137 08/13/2020 0956   NA 140 03/15/2019 1407   K 4.5 08/13/2020 0956   CL 98 08/13/2020 0956   CO2 30 08/13/2020 0956   BUN 15 08/13/2020 0956   BUN 30 (H) 03/15/2019 1407   CREATININE 0.76 08/13/2020 0956   CREATININE 0.51 03/13/2018 1034      Component Value Date/Time   CALCIUM 9.4 08/13/2020 0956   ALKPHOS 60 08/13/2020 0956   AST 16 08/13/2020 0956   ALT 14 08/13/2020 0956   BILITOT 0.5 08/13/2020 0956   BILITOT 0.4  03/15/2019 1407      Lab Results  Component Value Date   WBC 6.1 08/13/2020   HGB 13.7 08/13/2020   HCT 39.3 08/13/2020   MCV 94.7 08/13/2020   PLT 217 08/13/2020   Lab Results  Component Value Date   TSH 0.489 08/29/2019      Total time spent on today's visit was 45 minutes, including both face-to-face time and nonface-to-face time.  Time included that spent on review of records (prior notes available to me/labs/imaging if pertinent), discussing treatment and goals, answering patient's questions and coordinating care.  CC:  [name], MD

## 2020-08-22 NOTE — Progress Notes (Signed)
She presents today for follow-up of her plantar fasciitis.  She states that they are actually feeling better than they were she purchased a new pair shoes and it has made a difference.  Her right second metatarsophalangeal joint is painful.  And her left heel is a little bit more sore than that of the right.  Objective: Vital signs are stable alert oriented x3 pulses are palpable bilateral.  She still has some heel pain on palpation medial calcaneal tubercles bilaterally.  Left seems to be worse than the right.  She also has pain on end range of motion of the second metatarsophalangeal joint.  Plan: Discussed etiology pathology and surgical therapies at this point I went ahead and injected 5 mg of Kenalog and 5 mg of Marcaine to the point of maximal tenderness around the second metatarsophalangeal joint right foot.  I injected 20 mg Kenalog 5 mg Marcaine to the left heel.  She will continue on her current therapies and I will follow-up with her in a month

## 2020-08-24 DIAGNOSIS — M9901 Segmental and somatic dysfunction of cervical region: Secondary | ICD-10-CM | POA: Diagnosis not present

## 2020-08-24 DIAGNOSIS — S39012A Strain of muscle, fascia and tendon of lower back, initial encounter: Secondary | ICD-10-CM | POA: Diagnosis not present

## 2020-08-24 DIAGNOSIS — M9903 Segmental and somatic dysfunction of lumbar region: Secondary | ICD-10-CM | POA: Diagnosis not present

## 2020-08-24 DIAGNOSIS — M546 Pain in thoracic spine: Secondary | ICD-10-CM | POA: Diagnosis not present

## 2020-08-24 DIAGNOSIS — M9902 Segmental and somatic dysfunction of thoracic region: Secondary | ICD-10-CM | POA: Diagnosis not present

## 2020-08-26 ENCOUNTER — Other Ambulatory Visit: Payer: Self-pay

## 2020-08-26 ENCOUNTER — Encounter: Payer: Self-pay | Admitting: Neurology

## 2020-08-26 ENCOUNTER — Ambulatory Visit (INDEPENDENT_AMBULATORY_CARE_PROVIDER_SITE_OTHER): Payer: Medicare Other | Admitting: Neurology

## 2020-08-26 VITALS — BP 125/78 | HR 102 | Ht 64.0 in | Wt 222.0 lb

## 2020-08-26 DIAGNOSIS — M9902 Segmental and somatic dysfunction of thoracic region: Secondary | ICD-10-CM | POA: Diagnosis not present

## 2020-08-26 DIAGNOSIS — S39012A Strain of muscle, fascia and tendon of lower back, initial encounter: Secondary | ICD-10-CM | POA: Diagnosis not present

## 2020-08-26 DIAGNOSIS — M9901 Segmental and somatic dysfunction of cervical region: Secondary | ICD-10-CM | POA: Diagnosis not present

## 2020-08-26 DIAGNOSIS — M9903 Segmental and somatic dysfunction of lumbar region: Secondary | ICD-10-CM | POA: Diagnosis not present

## 2020-08-26 DIAGNOSIS — R251 Tremor, unspecified: Secondary | ICD-10-CM | POA: Diagnosis not present

## 2020-08-26 DIAGNOSIS — M546 Pain in thoracic spine: Secondary | ICD-10-CM | POA: Diagnosis not present

## 2020-08-26 MED ORDER — TOPIRAMATE 25 MG PO CPSP: ORAL_CAPSULE | ORAL | 0 refills | Status: DC

## 2020-08-26 MED ORDER — TOPIRAMATE 100 MG PO TABS: 100.0000 mg | ORAL_TABLET | Freq: Every day | ORAL | 1 refills | Status: DC

## 2020-08-26 NOTE — Patient Instructions (Addendum)
Start topiramate, 25 mg, 1 tablet daily for 1 week, then 2 tablets daily for 1 week and then 3 tablets daily for 1 week and then start topiramate 100 mg daily thereafter.  The physicians and staff at [facility] are committed to providing excellent care. You may receive a survey requesting feedback about your experience at our office. We strive to receive "very good" responses to the survey questions. If you feel that your experience would prevent you from giving the office a "very good " response, please contact our office to try to remedy the situation. We may be reached at [phone]. Thank you for taking the time out of your busy day to complete the survey.

## 2020-08-31 DIAGNOSIS — M546 Pain in thoracic spine: Secondary | ICD-10-CM | POA: Diagnosis not present

## 2020-08-31 DIAGNOSIS — S39012A Strain of muscle, fascia and tendon of lower back, initial encounter: Secondary | ICD-10-CM | POA: Diagnosis not present

## 2020-08-31 DIAGNOSIS — M9903 Segmental and somatic dysfunction of lumbar region: Secondary | ICD-10-CM | POA: Diagnosis not present

## 2020-08-31 DIAGNOSIS — M9901 Segmental and somatic dysfunction of cervical region: Secondary | ICD-10-CM | POA: Diagnosis not present

## 2020-08-31 DIAGNOSIS — M9902 Segmental and somatic dysfunction of thoracic region: Secondary | ICD-10-CM | POA: Diagnosis not present

## 2020-09-01 ENCOUNTER — Other Ambulatory Visit: Payer: Self-pay

## 2020-09-01 ENCOUNTER — Ambulatory Visit (INDEPENDENT_AMBULATORY_CARE_PROVIDER_SITE_OTHER): Payer: Medicare Other | Admitting: Clinical

## 2020-09-01 ENCOUNTER — Other Ambulatory Visit: Payer: Self-pay | Admitting: Neurology

## 2020-09-01 DIAGNOSIS — F4310 Post-traumatic stress disorder, unspecified: Secondary | ICD-10-CM

## 2020-09-01 NOTE — Progress Notes (Signed)
  Virtual Visit via Video Note  I connected withTamara [name] 11/23/21at9:00 AM EDTby a video enabled telemedicine application and verified that I am speaking with the correct person using two identifiers.  Location: Patient:Home Provider:Office  I discussed the limitations of evaluation and management by telemedicine and the availability of in person appointments. The patient expressed understanding and agreed to proceed.     THERAPIST PROGRESS NOTE  Session Time:9:00AM-9:55AM  Participation Level:Active  Behavioral Response:CasualAlertDepressed  Type of Therapy:Individual Therapy  Treatment Goals addressed:Coping  Interventions:CBT, DBT, Solution Focused, Strength-based and Supportive  Summary:Laura Leppertis a 59 y.o.femalewho presents with PTSD.The OPT therapist worked with thepatientfor herongoing OPT treatmentsession. The OPT therapist utilized Motivational Interviewing to assist in creating therapeutic repore. 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, the patient recently had a falling off a step stool and is recovering from the fall.The OPT therapist utilized Cognitive Behavioral Therapy through cognitive restructuring as well as worked with the patient on coping strategies to assist in management ofmental healthsymptoms as well asongoingwork tochallenge current negative automatic thoughts and improve the patients thought processes.The patient who has been reluctant to take pain medication has been using heat and cold to try help manage the pain from her fall and she is scheduled to a orthopedic doctor to get a MRI on her back to get more information about the damage from her fall. The patient has remained active with walking and notes this is helping with her back pain and  spasms. The OPT therapist continued to give support around being active, social, and positiveandacknowledged the primarily focus currently is getting health back to her baseline.The patient has been working in her Medication Therapy to reduce her mental health medication gradually titrating.  Suicidal/Homicidal:Nowithout intent/plan  Therapist Response:The OPT therapist worked with the patient for the patients scheduled session. The patient was engaged in hersession and gave feedback in relation to triggers, symptoms, and behavior responses over the pastfewweeks. The OPT therapist worked with the patient utilizing an in session Cognitive Behavioral Therapy exercise. The patient was responsive in the session and verbalized, "I am working now a part time job, I did have a fall and injury and I have to go to the orthopedic dr to get x-rays, I have been working with my prescriber to start titration of my mental health medications .The OPT therapistcontinued in this session towork with the patient on challenging her negative automatic thoughts, continue to work on positive thinking, as well as self esteem and confidence .The OPT therapist supported activity within the patients limits.The OPT therapist will continue treatment work with the patient in hernext scheduled session  Plan: Return again in2/3weeks.  Diagnosis:Axis I:Post Traumatic Stress Disorder  Axis II:No diagnosis  I discussed the assessment and treatment plan with the patient. The patient was provided an opportunity to ask questions and all were answered. The patient agreed with the plan and demonstrated an understanding of the instructions.  The patient was advised to call back or seek an in-person evaluation if the symptoms worsen or if the condition fails to improve as anticipated.  I provided23minutes of non-face-to-face time during this encounter.  Lennox Grumbles,  LCSW 09/01/2020

## 2020-09-02 DIAGNOSIS — M9901 Segmental and somatic dysfunction of cervical region: Secondary | ICD-10-CM | POA: Diagnosis not present

## 2020-09-02 DIAGNOSIS — M9903 Segmental and somatic dysfunction of lumbar region: Secondary | ICD-10-CM | POA: Diagnosis not present

## 2020-09-02 DIAGNOSIS — M546 Pain in thoracic spine: Secondary | ICD-10-CM | POA: Diagnosis not present

## 2020-09-02 DIAGNOSIS — M9902 Segmental and somatic dysfunction of thoracic region: Secondary | ICD-10-CM | POA: Diagnosis not present

## 2020-09-02 DIAGNOSIS — S39012A Strain of muscle, fascia and tendon of lower back, initial encounter: Secondary | ICD-10-CM | POA: Diagnosis not present

## 2020-09-07 DIAGNOSIS — M9902 Segmental and somatic dysfunction of thoracic region: Secondary | ICD-10-CM | POA: Diagnosis not present

## 2020-09-07 DIAGNOSIS — M9901 Segmental and somatic dysfunction of cervical region: Secondary | ICD-10-CM | POA: Diagnosis not present

## 2020-09-07 DIAGNOSIS — S39012A Strain of muscle, fascia and tendon of lower back, initial encounter: Secondary | ICD-10-CM | POA: Diagnosis not present

## 2020-09-07 DIAGNOSIS — M9903 Segmental and somatic dysfunction of lumbar region: Secondary | ICD-10-CM | POA: Diagnosis not present

## 2020-09-07 DIAGNOSIS — M546 Pain in thoracic spine: Secondary | ICD-10-CM | POA: Diagnosis not present

## 2020-09-10 DIAGNOSIS — M545 Low back pain, unspecified: Secondary | ICD-10-CM | POA: Diagnosis not present

## 2020-09-10 DIAGNOSIS — M542 Cervicalgia: Secondary | ICD-10-CM | POA: Diagnosis not present

## 2020-09-10 DIAGNOSIS — M25512 Pain in left shoulder: Secondary | ICD-10-CM | POA: Diagnosis not present

## 2020-09-11 DIAGNOSIS — M9903 Segmental and somatic dysfunction of lumbar region: Secondary | ICD-10-CM | POA: Diagnosis not present

## 2020-09-11 DIAGNOSIS — M546 Pain in thoracic spine: Secondary | ICD-10-CM | POA: Diagnosis not present

## 2020-09-11 DIAGNOSIS — M9902 Segmental and somatic dysfunction of thoracic region: Secondary | ICD-10-CM | POA: Diagnosis not present

## 2020-09-11 DIAGNOSIS — M9901 Segmental and somatic dysfunction of cervical region: Secondary | ICD-10-CM | POA: Diagnosis not present

## 2020-09-11 DIAGNOSIS — S39012A Strain of muscle, fascia and tendon of lower back, initial encounter: Secondary | ICD-10-CM | POA: Diagnosis not present

## 2020-09-14 DIAGNOSIS — M9901 Segmental and somatic dysfunction of cervical region: Secondary | ICD-10-CM | POA: Diagnosis not present

## 2020-09-14 DIAGNOSIS — S39012A Strain of muscle, fascia and tendon of lower back, initial encounter: Secondary | ICD-10-CM | POA: Diagnosis not present

## 2020-09-14 DIAGNOSIS — M546 Pain in thoracic spine: Secondary | ICD-10-CM | POA: Diagnosis not present

## 2020-09-14 DIAGNOSIS — M9902 Segmental and somatic dysfunction of thoracic region: Secondary | ICD-10-CM | POA: Diagnosis not present

## 2020-09-14 DIAGNOSIS — M9903 Segmental and somatic dysfunction of lumbar region: Secondary | ICD-10-CM | POA: Diagnosis not present

## 2020-09-16 DIAGNOSIS — M546 Pain in thoracic spine: Secondary | ICD-10-CM | POA: Diagnosis not present

## 2020-09-16 DIAGNOSIS — M9902 Segmental and somatic dysfunction of thoracic region: Secondary | ICD-10-CM | POA: Diagnosis not present

## 2020-09-16 DIAGNOSIS — S39012A Strain of muscle, fascia and tendon of lower back, initial encounter: Secondary | ICD-10-CM | POA: Diagnosis not present

## 2020-09-16 DIAGNOSIS — M9901 Segmental and somatic dysfunction of cervical region: Secondary | ICD-10-CM | POA: Diagnosis not present

## 2020-09-16 DIAGNOSIS — M9903 Segmental and somatic dysfunction of lumbar region: Secondary | ICD-10-CM | POA: Diagnosis not present

## 2020-09-23 ENCOUNTER — Ambulatory Visit (HOSPITAL_COMMUNITY): Payer: Medicare Other | Attending: Orthopedic Surgery | Admitting: Physical Therapy

## 2020-09-23 ENCOUNTER — Encounter (HOSPITAL_COMMUNITY): Payer: Self-pay | Admitting: Physical Therapy

## 2020-09-23 ENCOUNTER — Other Ambulatory Visit: Payer: Self-pay

## 2020-09-23 DIAGNOSIS — M79605 Pain in left leg: Secondary | ICD-10-CM | POA: Insufficient documentation

## 2020-09-23 DIAGNOSIS — M25552 Pain in left hip: Secondary | ICD-10-CM | POA: Diagnosis not present

## 2020-09-23 DIAGNOSIS — M545 Low back pain, unspecified: Secondary | ICD-10-CM | POA: Insufficient documentation

## 2020-09-23 NOTE — Therapy (Signed)
[place], [place], [address] Phone: [phone]   Fax:  [phone]  Physical Therapy Evaluation  Patient Details  Name: Laura [name] MRN: [id number] Date of Birth: 02-20-1961 Referring Provider (PT): Elsie Saas MD   Encounter Date: 09/23/2020   PT End of Session - 09/23/20 1023    Visit Number 1    Number of Visits 12    Date for PT Re-Evaluation 11/06/20    Authorization Type Medicare A, Medcaid 2nd    PT Start Time 0950    PT Stop Time 1025    PT Time Calculation (min) 35 min    Activity Tolerance Patient tolerated treatment well    Behavior During Therapy [facility] for tasks assessed/performed           Past Medical History:  Diagnosis Date  . Asthma   . Atrial fibrillation ([place])   . Bipolar disorder ([place])   . Bronchiectasis ([place])   . CHF (congestive heart failure) ([place])   . Complication of anesthesia    "my Dr in [place] told me I had an allergic reaction to the combination of the pain meds and the anesthesia" she does not remember what happened but "I eneded up in the ICU for 5 days".  . H/O congenital atrial septal defect (ASD) repair   . Hypogammaglobulinemia ([place])   . Hypothyroid   . 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 Neuropathy   . Osteoarthritis   . PTSD (post-traumatic stress disorder)   . Recurrent sinusitis   . Short-term memory loss   . Sleep apnea   . TIA (transient ischemic attack)   . Tremors of nervous system    seeing PCP for this.  . Type 2 diabetes mellitus ([place])   . Urticaria   . Wound infection     Past Surgical History:  Procedure Laterality Date  . 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 CARDIAC SURGERY    . CARDIOVERSION    . CARDIOVERSION N/A 08/29/2018   Procedure: CARDIOVERSION;  Surgeon: Arnoldo Lenis, MD;  [facility];  Service: Endoscopy;  Laterality: N/A;  . [place]  . LEFT HEART CATH AND CORONARY ANGIOGRAPHY N/A 08/30/2019   Procedure:  LEFT HEART CATH AND CORONARY ANGIOGRAPHY;  Surgeon: [name], MD;  Location: [place] CV LAB;  Service: Cardiovascular;  Laterality: N/A;  . NASAL SINUS SURGERY  2017    There were no vitals filed for this visit.    Subjective Assessment - 09/23/20 1001    Subjective Patient presents to physical therapy with complaint of LT low back and hip pain after a fall about 6 weeks ago. She says she sat on a folding stool and it collapsed causing her to fall on her LT side. She had imaging which showed no fractures. She says she has a history of low back pain but this flared it up. She also reports LT knee, shoulder, and neck pain from her fall but notes her back and hip seem to be bothering her most right now. She has been managing symptoms with prednisone which she says helps, she cannot take NSAIDs.    Limitations Sitting;Standing;Walking;House hold activities;Lifting    Diagnostic tests xrays    Patient Stated Goals To be healed, be able to move    Currently in Pain? Yes    Pain Score 3     Pain Location Back    Pain Orientation Posterior;Lower  Pain Descriptors / Indicators Aching;Sharp    Pain Type Acute pain    Pain Radiating Towards LT knee    Pain Onset More than a month ago    Pain Frequency Constant    Aggravating Factors  sitting, standing, walking    Pain Relieving Factors Meds    Effect of Pain on Daily Activities Limits    Multiple Pain Sites Yes    Pain Score 3    Pain Location Hip    Pain Orientation Lateral;Posterior;Left    Pain Descriptors / Indicators Aching;Sharp    Pain Type Acute pain    Pain Onset More than a month ago    Pain Frequency Constant    Aggravating Factors  standing, walking    Pain Relieving Factors meds    Effect of Pain on Daily Activities Limits              [facility] PT Assessment - 09/23/20 0001      Assessment   Medical Diagnosis LBP, LT hip, LT leg pain    Referring Provider (PT) Elsie Saas MD    Onset Date/Surgical Date  08/16/20    Prior Therapy Yes      Precautions   Precautions None      Restrictions   Weight Bearing Restrictions No      Balance Screen   Has the patient fallen in the past 6 months Yes    How many times? 1    Has the patient had a decrease in activity level because of a fear of falling?  Yes    Is the patient reluctant to leave their home because of a fear of falling?  No      Home Ecologist residence      Prior Function   Level of Independence Independent      Cognition   Overall Cognitive Status Within Functional Limits for tasks assessed      Observation/Other Assessments   Focus on Therapeutic Outcomes (FOTO)  Complete next session      Posture/Postural Control   Posture/Postural Control Postural limitations    Postural Limitations Decreased lumbar lordosis   slouched seated posture     ROM / Strength   AROM / PROM / Strength AROM;Strength      AROM   AROM Assessment Site Lumbar    Lumbar Flexion 30% limited    Lumbar Extension 80% limited    Lumbar - Right Side Bend 50% limited    Lumbar - Left Side Bend 50% limited    Lumbar - Right Rotation 30% limited    Lumbar - Left Rotation 30% limited      Strength   Strength Assessment Site Hip;Knee;Ankle    Right/Left Hip Right;Left    Right Hip Flexion 4+/5    Right Hip Extension 4/5    Right Hip ABduction 4/5    Left Hip Flexion 4-/5    Left Hip Extension 3-/5   pain   Left Hip ABduction 3+/5    Right/Left Knee Right;Left    Right Knee Extension 4+/5    Left Knee Extension 4-/5    Right/Left Ankle Right;Left    Right Ankle Dorsiflexion 4+/5    Left Ankle Dorsiflexion 4+/5      Palpation   Palpation comment Mod TTP about sacrum, LT hip, LT lateral thigh, about LT knee                      Objective  measurements completed on examination: See above findings.       [place] Adult PT Treatment/Exercise - 09/23/20 0001      Exercises   Exercises Lumbar       Lumbar Exercises: Stretches   Lower Trunk Rotation 2 reps      Lumbar Exercises: Supine   Ab Set 5 reps                  PT Education - 09/23/20 1005    Education Details on evaluation findings, POC and HEP    Person(s) Educated Patient    Methods Explanation;Handout    Comprehension Verbalized understanding            PT Short Term Goals - 09/23/20 1029      PT SHORT TERM GOAL #1   Title Patient will be independent with initial HEP and self-management strategies to improve functional outcomes    Time 3    Period Weeks    Status New    Target Date 10/16/20             PT Long Term Goals - 09/23/20 1030      PT LONG TERM GOAL #1   Title Patient will improve FOTO score by 10% to indicate improvement in functional outcomes    Time 6    Period Weeks    Status New    Target Date 11/06/20      PT LONG TERM GOAL #2   Title Patient will report at least 70% overall improvement in subjective complaint to indicate improvement in ability to perform ADLs.    Time 6    Period Weeks    Status New    Target Date 11/06/20      PT LONG TERM GOAL #3   Title Patient will have equal to or > 4+/5 MMT throughout BLE to improve ability to perform functional mobility, stair ambulation and ADLs.    Time 6    Period Weeks    Status New    Target Date 11/06/20                  Plan - 09/23/20 1023    Clinical Impression Statement Patient is a 59 y.o. female who presents to physical therapy with complaint of lumbar, LT hip, and LT leg pain. Patient demonstrates decreased strength, ROM restriction, increased TTP and postural deficits which are likely contributing to symptoms of pain and are negatively impacting patient ability to perform ADLs and functional mobility tasks. Patient will benefit from skilled physical therapy services to address these deficits to reduce pain and improve level of function with ADLs and functional mobility tasks    Personal Factors and  Comorbidities Comorbidity 3+    Comorbidities COPD, Asthma, HTN, A-fib, DM    Stability/Clinical Decision Making Stable/Uncomplicated    Clinical Decision Making Low    Rehab Potential Good    PT Frequency 2x / week    PT Duration 6 weeks    PT Treatment/Interventions ADLs/Self Care Home Management;Aquatic Therapy;Biofeedback;Canalith Repostioning;Cryotherapy;Electrical Stimulation;Iontophoresis 4mg /ml Dexamethasone;Moist Heat;Traction;Balance training;Manual lymph drainage;Manual techniques;Vestibular;Vasopneumatic Device;Taping;Splinting;Orthotic Fit/Training;Energy conservation;Stair training;Functional mobility training;Therapeutic activities;Therapeutic exercise;Gait training;DME Instruction;Patient/family education;Passive range of motion;Dry needling;Joint Manipulations;Spinal Manipulations;Compression bandaging;Visual/perceptual remediation/compensation;Scar mobilization;Neuromuscular re-education;Ultrasound;Parrafin;Fluidtherapy;Contrast Bath    PT Next Visit Plan Complete FOTO. Review goals and HEP. Progress hip and core strength and lumbar mobility as tolerated. Manuals PRN    PT Home Exercise Plan Eval: ab set, LTR    Consulted and Agree with Plan of Care Patient  Patient will benefit from skilled therapeutic intervention in order to improve the following deficits and impairments:  Abnormal gait,Impaired flexibility,Postural dysfunction,Improper body mechanics,Decreased range of motion,Decreased balance,Decreased mobility,Difficulty walking,Pain,Increased fascial restricitons,Decreased strength,Decreased activity tolerance,Hypomobility  Visit Diagnosis: Low back pain, unspecified back pain laterality, unspecified chronicity, unspecified whether sciatica present  Pain in left hip  Pain in left leg     Problem List Patient Active Problem List   Diagnosis Date Noted  . Abnormal EKG   . Chronic diastolic (congestive) heart failure ([place]) 08/29/2019  . Shortness of  breath 08/29/2019  . Bipolar disorder ([place]) 08/10/2018  . Headache 08/10/2018  . H/O atrial flutter 08/10/2018  . OSA on CPAP 08/10/2018  . Anxiety 08/10/2018  . Uncontrolled type 2 diabetes mellitus with hyperglycemia ([place]) 03/13/2018  . Hypothyroidism 03/13/2018  . Essential hypertension, benign 03/13/2018  . Mixed hyperlipidemia 03/13/2018  . Hypogammaglobulinemia ([place]) 02/06/2018  . Non-allergic rhinitis 02/06/2018  . Severe persistent asthma, uncomplicated 92/33/0076  . Pulmonary nodules 02/06/2018  . Bronchiectasis without complication ([place]) 22/63/3354  . DM type 2 causing vascular disease ([place]) 02/06/2018  10:46 AM, 09/23/20 Josue Hector PT DPT  Physical Therapist with [facility]  [phone] [place] [address] [place], [place], [address] Phone: [phone]   Fax:  [phone]  Name: Sharanda Shinault MRN: [id number] Date of Birth: 02/20/1961

## 2020-09-28 ENCOUNTER — Ambulatory Visit (HOSPITAL_COMMUNITY): Payer: Medicare Other | Admitting: Clinical

## 2020-09-29 ENCOUNTER — Ambulatory Visit (HOSPITAL_COMMUNITY): Payer: Medicare Other | Admitting: Physical Therapy

## 2020-09-30 ENCOUNTER — Ambulatory Visit: Payer: Medicare Other | Admitting: Allergy & Immunology

## 2020-10-01 ENCOUNTER — Ambulatory Visit: Payer: Medicare Other | Admitting: Podiatry

## 2020-10-07 ENCOUNTER — Other Ambulatory Visit: Payer: Self-pay

## 2020-10-07 ENCOUNTER — Ambulatory Visit (INDEPENDENT_AMBULATORY_CARE_PROVIDER_SITE_OTHER): Payer: Medicare Other | Admitting: Clinical

## 2020-10-07 DIAGNOSIS — F4310 Post-traumatic stress disorder, unspecified: Secondary | ICD-10-CM

## 2020-10-07 NOTE — Progress Notes (Signed)
Virtual Visit via Telephone Note  I connected with Laura [name] on 10/07/20 at  8:00 AM EST by telephone and verified that I am speaking with the correct person using two identifiers.  Location: Patient: Home Provider: Office   I discussed the limitations, risks, security and privacy concerns of performing an evaluation and management service by telephone and the availability of in person appointments. I also discussed with the patient that there may be a patient responsible charge related to this service. The patient expressed understanding and agreed to proceed.  THERAPIST PROGRESS NOTE  Session Time:8:00AM-8:45AM  Participation Level:Active  Behavioral Response:CasualAlertDepressed  Type of Therapy:Individual Therapy  Treatment Goals addressed:Coping  Interventions:CBT, DBT, Solution Focused, Strength-based and Supportive  Summary:Laura Leppertis a 59 y.o.femalewho presents with PTSD.The OPT therapist worked with thepatientfor herongoing OPT treatmentsession. The OPT therapist utilized Motivational Interviewing to assist in creating therapeutic repore. The patient in the session was engaged and work in Tour manager about hertriggers and symptoms over the past few weeksincludingconflict between her daughters that has been triggering for her. The OPT therapist utilized Cognitive Behavioral Therapy through cognitive restructuring as well as worked with the patient on coping strategies to assist in management ofmental healthsymptoms as well asongoingwork tochallenge current negative automatic thoughts and improve the patients thought processes.The patient spoke about working and her ongoing work to manage her pain. The OPT therapist continued to give support around being active, social, and positiveandacknowledged the primarily focus currently is getting health back to her baseline.  Suicidal/Homicidal:Nowithout  intent/plan  Therapist Response:The OPT therapist worked with the patient for the patients scheduled session. The patient was engaged in hersession and gave feedback in relation to triggers, symptoms, and behavior responses over the pastfewweeks. The OPT therapist worked with the patient utilizing an in session Cognitive Behavioral Therapy exercise. The patient was responsive in the session and verbalized, "I am just working now and doing better and I am focusing on me" .The OPT therapistcontinued in this session towork with the patient on challenging her negative automatic thoughts, continue to work on positive thinking, as well as self esteem and confidence .The OPT therapist supported activity within the patients limits.The OPT therapist will continue treatment work with the patient in hernext scheduled session  Plan: Return again in2/3weeks.  Diagnosis:Axis I:Post Traumatic Stress Disorder  Axis II:No diagnosis  I discussed the assessment and treatment plan with the patient. The patient was provided an opportunity to ask questions and all were answered. The patient agreed with the plan and demonstrated an understanding of the instructions.  The patient was advised to call back or seek an in-person evaluation if the symptoms worsen or if the condition fails to improve as anticipated.  I provided72minutes of non-face-to-face time during this encounter.  Winfred Burn, LCSW 10/07/2020

## 2020-10-08 ENCOUNTER — Ambulatory Visit (HOSPITAL_COMMUNITY): Payer: Medicare Other

## 2020-10-08 ENCOUNTER — Other Ambulatory Visit: Payer: Self-pay

## 2020-10-08 DIAGNOSIS — M79605 Pain in left leg: Secondary | ICD-10-CM

## 2020-10-08 DIAGNOSIS — M545 Low back pain, unspecified: Secondary | ICD-10-CM | POA: Diagnosis not present

## 2020-10-08 DIAGNOSIS — M25552 Pain in left hip: Secondary | ICD-10-CM

## 2020-10-08 NOTE — Therapy (Signed)
[facility] [address] [place], [place], [address] Phone: [phone]   Fax:  [phone]  Physical Therapy Treatment  Patient Details  Name: Laura [name] MRN: [id number] Date of Birth: Feb 26, 1961 Referring Provider (PT): Salvatore Marvelobert Wainer MD   Encounter Date: 10/08/2020   PT End of Session - 10/08/20 1628    Visit Number 2    Number of Visits 12    Date for PT Re-Evaluation 11/06/20    Authorization Type Medicare A, Medcaid 2nd    PT Start Time 1515    PT Stop Time 1600    PT Time Calculation (min) 45 min    Activity Tolerance Patient tolerated treatment well    Behavior During Therapy [facility] for tasks assessed/performed           Past Medical History:  Diagnosis Date  . Asthma   . Atrial fibrillation (HCC)   . Bipolar disorder (HCC)   . Bronchiectasis (HCC)   . CHF (congestive heart failure) (HCC)   . Complication of anesthesia    "[place] in [place] had an allergic reaction to the combination of the pain meds and the anesthesia" she does not remember what happened but "I eneded up in the ICU for 5 days".  . H/O congenital atrial septal defect (ASD) repair   . Hypogammaglobulinemia (HCC)   . Hypothyroid   . IgE deficiency (HCC)   . Neuropathy   . Osteoarthritis   . PTSD (post-traumatic stress disorder)   . Recurrent sinusitis   . Short-term memory loss   . Sleep apnea   . TIA (transient ischemic attack)   . Tremors of nervous system    seeing PCP for this.  . Type 2 diabetes mellitus (HCC)   . Urticaria   . Wound infection     Past Surgical History:  Procedure Laterality Date  . 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 CARDIAC SURGERY    . CARDIOVERSION    . CARDIOVERSION N/A 08/29/2018   Procedure: CARDIOVERSION;  Surgeon: Antoine PocheBranch, Jonathan F, MD;  [facility];  Service: Endoscopy;  Laterality: N/A;  . CESAREAN SECTION  1986, 1988, 1994  . LEFT HEART CATH AND CORONARY ANGIOGRAPHY N/A 08/30/2019   Procedure:  LEFT HEART CATH AND CORONARY ANGIOGRAPHY;  Surgeon: Corky CraftsVaranasi, Jayadeep S, MD;  [facility];  Service: Cardiovascular;  Laterality: N/A;  . NASAL SINUS SURGERY  2017    There were no vitals filed for this visit.   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 Patient reports most pain is prolonged sitting or standing    Limitations Sitting;Standing;Walking;House hold activities;Lifting    Diagnostic tests xrays    Patient Stated Goals To be healed, be able to move    Currently in Pain? Yes    Pain Score 3     Pain Location Buttocks    Pain Type Chronic pain    Pain Onset More than a month ago    Pain Onset More than a month ago              [facility] Assessment - 10/08/20 0001      Assessment   Medical Diagnosis LBP, LT hip, LT leg pain    Referring Provider (PT) Salvatore Marvelobert Wainer MD    Onset Date/Surgical  Date 08/16/20      Observation/Other Assessments   Focus on Therapeutic Outcomes (FOTO)  49% function      Palpation   SI assessment  no leg length discrepancy noted      Special Tests    Special Tests Lumbar;Sacrolliac Tests    Sacroiliac Tests  Pelvic Compression      Pelvic Dictraction   Findings Negative    Side  Right                         [facility] Adult PT Treatment/Exercise - 10/08/20 0001      Exercises   Exercises Lumbar      Lumbar Exercises: Stretches   Double Knee to Chest Stretch 3 reps;10 seconds    Lower Trunk Rotation 5 reps;10 seconds      Lumbar Exercises: Standing   Other Standing Lumbar Exercises backwards walking for hip extension      Lumbar Exercises: Supine   Ab Set 10 reps;2 seconds      Lumbar Exercises: Prone   Other Prone Lumbar Exercises prone heel squeeze 2x10 for glute strength                  PT Education - 10/08/20  1627    Education Details pt educated on sitting posture and use of lumbar roll to maintain improved posture and decreased SI pressure. Demonstrated McKenzie lumbar roll    Person(s) Educated Patient    Methods Explanation    Comprehension Verbalized understanding;Returned demonstration            PT Short Term Goals - 09/23/20 1029      PT SHORT TERM GOAL #1   Title Patient will be independent with initial HEP and self-management strategies to improve functional outcomes    Time 3    Period Weeks    Status New    Target Date 10/16/20             PT Long Term Goals - 09/23/20 1030      PT LONG TERM GOAL #1   Title Patient will improve FOTO score by 10% to indicate improvement in functional outcomes    Time 6    Period Weeks    Status New    Target Date 11/06/20      PT LONG TERM GOAL #2   Title Patient will report at least 70% overall improvement in subjective complaint to indicate improvement in ability to perform ADLs.    Time 6    Period Weeks    Status New    Target Date 11/06/20      PT LONG TERM GOAL #3   Title Patient will have equal to or > 4+/5 MMT throughout BLE to improve ability to perform functional mobility, stair ambulation and ADLs.    Time 6    Period Weeks    Status New    Target Date 11/06/20                 Plan - 10/08/20 1631    Clinical Impression Statement Patient returns to PT session and reports continued pain along SI joint Left > Right.  Conducted various SI assessments which were not blatantly obvious.  Patient demonstrates pain/dysfunction with extension bias movements and reports difficulty with prolonged sitting/standing and lifting objects overhead.  FOTO survey conducted and scores 49.25% function. Patient tolerating tx session well and encouraged to progress extension-based movements such as backward walking, prone heel  squeeze, etc to facilitate glute strength.    Personal Factors and Comorbidities Comorbidity 3+     Comorbidities COPD, Asthma, HTN, A-fib, DM    Stability/Clinical Decision Making Stable/Uncomplicated    Rehab Potential Good    PT Frequency 2x / week    PT Duration 6 weeks    PT Treatment/Interventions ADLs/Self Care Home Management;Aquatic Therapy;Biofeedback;Canalith Repostioning;Cryotherapy;Electrical Stimulation;Iontophoresis 4mg /ml Dexamethasone;Moist Heat;Traction;Balance training;Manual lymph drainage;Manual techniques;Vestibular;Vasopneumatic Device;Taping;Splinting;Orthotic Fit/Training;Energy conservation;Stair training;Functional mobility training;Therapeutic activities;Therapeutic exercise;Gait training;DME Instruction;Patient/family education;Passive range of motion;Dry needling;Joint Manipulations;Spinal Manipulations;Compression bandaging;Visual/perceptual remediation/compensation;Scar mobilization;Neuromuscular re-education;Ultrasound;Parrafin;Fluidtherapy;Contrast Bath    PT Next Visit Plan Complete FOTO. Review goals and HEP. Progress hip and core strength and lumbar mobility as tolerated. Manuals PRN    PT Home Exercise Plan Eval: ab set, LTR    Consulted and Agree with Plan of Care Patient           Patient will benefit from skilled therapeutic intervention in order to improve the following deficits and impairments:  Abnormal gait,Impaired flexibility,Postural dysfunction,Improper body mechanics,Decreased range of motion,Decreased balance,Decreased mobility,Difficulty walking,Pain,Increased fascial restricitons,Decreased strength,Decreased activity tolerance,Hypomobility  Visit Diagnosis: Low back pain, unspecified back pain laterality, unspecified chronicity, unspecified whether sciatica present  Pain in left hip  Pain in left leg     Problem List Patient Active Problem List   Diagnosis Date Noted  . Abnormal EKG   . Chronic diastolic (congestive) heart failure ([place]) 08/29/2019  . Shortness of breath 08/29/2019  . Bipolar disorder ([place]) 08/10/2018  . Headache  08/10/2018  . H/O atrial flutter 08/10/2018  . OSA on CPAP 08/10/2018  . Anxiety 08/10/2018  . Uncontrolled type 2 diabetes mellitus with hyperglycemia ([place]) 03/13/2018  . Hypothyroidism 03/13/2018  . Essential hypertension, benign 03/13/2018  . Mixed hyperlipidemia 03/13/2018  . Hypogammaglobulinemia ([place]) 02/06/2018  . Non-allergic rhinitis 02/06/2018  . Severe persistent asthma, uncomplicated [id number]  . Pulmonary nodules 02/06/2018  . Bronchiectasis without complication ([place]) [id number]  . DM type 2 causing vascular disease ([place]) 02/06/2018    4:37 PM, 10/08/20 M. Sherlyn Lees, PT, DPT Physical Therapist- [place] Office Number: [phone]  [place] [address] [place], [place], [address] Phone: [phone]   Fax:  [phone]  Name: Shekia Filan MRN: [id number] Date of Birth: [birth year]

## 2020-10-13 ENCOUNTER — Ambulatory Visit (HOSPITAL_COMMUNITY): Payer: Medicare Other | Attending: Orthopedic Surgery | Admitting: Physical Therapy

## 2020-10-13 ENCOUNTER — Encounter (HOSPITAL_COMMUNITY): Payer: Self-pay | Admitting: Physical Therapy

## 2020-10-13 ENCOUNTER — Other Ambulatory Visit: Payer: Self-pay

## 2020-10-13 DIAGNOSIS — M25552 Pain in left hip: Secondary | ICD-10-CM | POA: Diagnosis not present

## 2020-10-13 DIAGNOSIS — M545 Low back pain, unspecified: Secondary | ICD-10-CM | POA: Insufficient documentation

## 2020-10-13 DIAGNOSIS — M79605 Pain in left leg: Secondary | ICD-10-CM | POA: Insufficient documentation

## 2020-10-13 NOTE — Therapy (Signed)
[place], [place], [address] Phone: [phone]   Fax:  [phone]  Physical Therapy Treatment  Patient Details  Name: Asayo Buckalew MRN: [id number] Date of Birth: [birth year] Referring Provider (PT): Elsie Saas MD   Encounter Date: 10/13/2020   PT End of Session - 10/13/20 1403    Visit Number 3    Number of Visits 12    Date for PT Re-Evaluation 11/06/20    Authorization Type Medicare A, Medcaid 2nd    PT Start Time 1402    PT Stop Time 1440    PT Time Calculation (min) 38 min    Activity Tolerance Patient tolerated treatment well    Behavior During Therapy [facility] for tasks assessed/performed           Past Medical History:  Diagnosis Date  . Asthma   . Atrial fibrillation ([place])   . Bipolar disorder ([place])   . Bronchiectasis ([place])   . CHF (congestive heart failure) ([place])   . Complication of anesthesia    "my Dr in [place] told me I had an allergic reaction to the combination of the pain meds and the anesthesia" she does not remember what happened but "I eneded up in the ICU for 5 days".  . H/O congenital atrial septal defect (ASD) repair   . Hypogammaglobulinemia ([place])   . Hypothyroid   . IgE deficiency ([place])   . Neuropathy   . Osteoarthritis   . PTSD (post-traumatic stress disorder)   . Recurrent sinusitis   . Short-term memory loss   . Sleep apnea   . TIA (transient ischemic attack)   . Tremors of nervous system    seeing PCP for this.  . Type 2 diabetes mellitus ([place])   . Urticaria   . Wound infection     Past Surgical History:  Procedure Laterality Date  . 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 CARDIAC SURGERY    . CARDIOVERSION    . CARDIOVERSION N/A 08/29/2018   Procedure: CARDIOVERSION;  Surgeon: Arnoldo Lenis, MD;  [facility];  Service: Endoscopy;  Laterality: N/A;  . [place]  . LEFT HEART CATH AND CORONARY ANGIOGRAPHY N/A 08/30/2019   Procedure:  LEFT HEART CATH AND CORONARY ANGIOGRAPHY;  Surgeon: [name], MD;  Location: [place] CV LAB;  Service: Cardiovascular;  Laterality: N/A;  . NASAL SINUS SURGERY  2017    There were no vitals filed for this visit.   Subjective Assessment - 10/13/20 1411    Subjective States that she has be sore across her low back/pelvis with the left>Right. States that it has been radiating up and across all week. States she called her ortho for next week, she sees them next week. States that the PT thinks she should get an MRI. States she has had bladder changes but that occurred prior to her fall.    Limitations Sitting;Standing;Walking;House hold activities;Lifting    Diagnostic tests xrays    Patient Stated Goals To be healed, be able to move    Currently in Pain? Yes    Pain Score 5     Pain Location Back    Pain Orientation Posterior;Lateral    Pain Descriptors / Indicators Throbbing    Pain Type Chronic pain    Pain Onset More than a month ago    Pain Onset More than a month ago  [facility] PT Assessment - 10/13/20 0001      Assessment   Medical Diagnosis LBP, LT hip, LT leg pain    Referring Provider (PT) Salvatore Marvel MD                         [facility] Adult PT Treatment/Exercise - 10/13/20 0001      Lumbar Exercises: Standing   Other Standing Lumbar Exercises lumbar extension at counter x10 10" holds      Lumbar Exercises: Seated   Other Seated Lumbar Exercises long exhale breath work x10 5" holds      Modalities   Modalities Cryotherapy      Cryotherapy   Number Minutes Cryotherapy 10 Minutes    Cryotherapy Location Lumbar Spine    Type of Cryotherapy Ice pack   seated during self care     Manual Therapy   Manual Therapy Soft tissue mobilization    Manual therapy comments all manual interventions performed independently of other intervention    Soft tissue mobilization insturment assisted soft tissue mobilization with percussion gun - 10  minutes to left lumbar/thoracic paraspinals and left glutes                  PT Education - 10/13/20 1420    Education Details on anatomy, on rationale for exercises. Answered all questions and concerns. on importance of breath work and long exhale and why it is important    Teacher, music) Educated Patient    Methods Explanation    Comprehension Verbalized understanding            PT Short Term Goals - 09/23/20 1029      PT SHORT TERM GOAL #1   Title Patient will be independent with initial HEP and self-management strategies to improve functional outcomes    Time 3    Period Weeks    Status New    Target Date 10/16/20             PT Long Term Goals - 09/23/20 1030      PT LONG TERM GOAL #1   Title Patient will improve FOTO score by 10% to indicate improvement in functional outcomes    Time 6    Period Weeks    Status New    Target Date 11/06/20      PT LONG TERM GOAL #2   Title Patient will report at least 70% overall improvement in subjective complaint to indicate improvement in ability to perform ADLs.    Time 6    Period Weeks    Status New    Target Date 11/06/20      PT LONG TERM GOAL #3   Title Patient will have equal to or > 4+/5 MMT throughout BLE to improve ability to perform functional mobility, stair ambulation and ADLs.    Time 6    Period Weeks    Status New    Target Date 11/06/20                 Plan - 10/13/20 1424    Clinical Impression Statement Patient with concerns of continued back pain and made appointment with MD next week secondary to wanting MRI. Educated patient on anatomy and rationale for exercises. Tolerated percussion gun poorly at first but this improved with deep breathing. Lots of education today secondary to hesitancy with laying prone and certain movements.    Personal Factors and Comorbidities Comorbidity 3+    Comorbidities COPD,  Asthma, HTN, A-fib, DM    Stability/Clinical Decision Making Stable/Uncomplicated     Rehab Potential Good    PT Frequency 2x / week    PT Duration 6 weeks    PT Treatment/Interventions ADLs/Self Care Home Management;Aquatic Therapy;Biofeedback;Canalith Repostioning;Cryotherapy;Electrical Stimulation;Iontophoresis 4mg /ml Dexamethasone;Moist Heat;Traction;Balance training;Manual lymph drainage;Manual techniques;Vestibular;Vasopneumatic Device;Taping;Splinting;Orthotic Fit/Training;Energy conservation;Stair training;Functional mobility training;Therapeutic activities;Therapeutic exercise;Gait training;DME Instruction;Patient/family education;Passive range of motion;Dry needling;Joint Manipulations;Spinal Manipulations;Compression bandaging;Visual/perceptual remediation/compensation;Scar mobilization;Neuromuscular re-education;Ultrasound;Parrafin;Fluidtherapy;Contrast Bath    PT Next Visit Plan Complete FOTO. Review goals and HEP. Progress hip and core strength and lumbar mobility as tolerated. Manuals PRN    PT Home Exercise Plan Eval: ab set, LTR; lumbar extension standing, long exhale breathing    Consulted and Agree with Plan of Care Patient           Patient will benefit from skilled therapeutic intervention in order to improve the following deficits and impairments:  Abnormal gait,Impaired flexibility,Postural dysfunction,Improper body mechanics,Decreased range of motion,Decreased balance,Decreased mobility,Difficulty walking,Pain,Increased fascial restricitons,Decreased strength,Decreased activity tolerance,Hypomobility  Visit Diagnosis: Low back pain, unspecified back pain laterality, unspecified chronicity, unspecified whether sciatica present  Pain in left hip  Pain in left leg     Problem List Patient Active Problem List   Diagnosis Date Noted  . Abnormal EKG   . Chronic diastolic (congestive) heart failure ([place]) 08/29/2019  . Shortness of breath 08/29/2019  . Bipolar disorder ([place]) 08/10/2018  . Headache 08/10/2018  . H/O atrial flutter 08/10/2018  . OSA on  CPAP 08/10/2018  . Anxiety 08/10/2018  . Uncontrolled type 2 diabetes mellitus with hyperglycemia ([place]) 03/13/2018  . Hypothyroidism 03/13/2018  . Essential hypertension, benign 03/13/2018  . Mixed hyperlipidemia 03/13/2018  . Hypogammaglobulinemia ([place]) 02/06/2018  . Non-allergic rhinitis 02/06/2018  . Severe persistent asthma, uncomplicated [id number]  . Pulmonary nodules 02/06/2018  . Bronchiectasis without complication ([place]) [id number]  . DM type 2 causing vascular disease ([place]) 02/06/2018    2:44 PM, 10/13/20 Jerene Pitch, DPT Physical Therapy with [facility]  [phone] office  [place] [address] [place], [place], [address] Phone: [phone]   Fax:  [phone]  Name: Berdia Schechter MRN: [id number] Date of Birth: [birth year]

## 2020-10-14 ENCOUNTER — Ambulatory Visit (INDEPENDENT_AMBULATORY_CARE_PROVIDER_SITE_OTHER): Payer: Medicare Other | Admitting: Allergy & Immunology

## 2020-10-14 ENCOUNTER — Encounter: Payer: Self-pay | Admitting: Allergy & Immunology

## 2020-10-14 VITALS — BP 140/80 | HR 88 | Resp 18 | Wt 210.0 lb

## 2020-10-14 DIAGNOSIS — D839 Common variable immunodeficiency, unspecified: Secondary | ICD-10-CM | POA: Diagnosis not present

## 2020-10-14 DIAGNOSIS — J4550 Severe persistent asthma, uncomplicated: Secondary | ICD-10-CM | POA: Diagnosis not present

## 2020-10-14 DIAGNOSIS — R918 Other nonspecific abnormal finding of lung field: Secondary | ICD-10-CM

## 2020-10-14 DIAGNOSIS — J479 Bronchiectasis, uncomplicated: Secondary | ICD-10-CM

## 2020-10-14 DIAGNOSIS — J310 Chronic rhinitis: Secondary | ICD-10-CM | POA: Diagnosis not present

## 2020-10-14 NOTE — Progress Notes (Signed)
Immunotherapy   Patient Details  Name: Laura [name] MRN: [id number] Date of Birth: 10/06/1961  10/14/2020  Laura [name] brought her Harrington Challenger Pen into the office today. Patient already gives herself Trulicity once weekly at home. I spoke with Dr. Dellis Anes and he gave verbal okay to allow patient to home administer her [place] Challenger. Patient demonstrated proper technique in office.  LOT [id number] [id number] 08-10-2021 Right side abdomen injection site.  [name] 10/14/2020, 11:26 AM

## 2020-10-14 NOTE — Progress Notes (Signed)
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 Purvis Sheffield  Tremor - thought to be related to a medication side effect  Congestive heart failure- seesDr. Purvis Sheffield  Plan/Recommendations:   1. Hypogammaglobulinemia - Continue with Cuvitru at the same dose. - Come back for your Moderna booster on January 26th at 11:30am.   2. Chronic rhinitis   - Continue with nasal saline rinses as needed.   3. Severe persistent asthma, uncomplicated - Lung function deferred today. - Stop the Symbicort and the Spiriva and START Breztri two puffs twice daily (contains Symbicort and a medication similar to Spiriva).  - Daily controller medication(s): Breztri two puffs twice daily with a spacer + Fasenra every 8 weeks - Prior to physical activity: ProAir 2 puffs 10-15 minutes before physical activity. - Rescue medications: ProAir 4 puffs every 4-6 hours as needed, albuterol nebulizer one vial every 4-6 hours as needed or DuoNeb nebulizer one vial every 4-6 hours as needed - Asthma control goals:  * Full participation in all desired activities (may need albuterol before activity) * Albuterol use two time or less a week on average (not counting use with activity) * Cough interfering with sleep two time or less a month * Oral steroids no more than once a year * No hospitalizations.  4. Return in about 4 months (around 02/11/2021).   Subjective:   Laura [name] is a 60 y.o. female presenting today for follow  up of  Chief Complaint  Patient presents with  . Asthma    Been doing okay. Patient says she would describe it as moderate. Maybe the past couple weeks been a little harder to breathe. Has had to use her albuterol.     Tien Aispuro has a history of the following: Patient Active Problem List   Diagnosis Date Noted  . Abnormal EKG   . Chronic diastolic (congestive) heart failure (HCC) 08/29/2019  . Shortness of breath 08/29/2019  . Bipolar disorder (HCC) 08/10/2018  . Headache 08/10/2018  . H/O atrial flutter 08/10/2018  . OSA on CPAP 08/10/2018  . Anxiety 08/10/2018  . Uncontrolled type 2 diabetes mellitus with hyperglycemia (HCC) 03/13/2018  . Hypothyroidism 03/13/2018  . Essential hypertension, benign 03/13/2018  . Mixed hyperlipidemia 03/13/2018  . Hypogammaglobulinemia (HCC) 02/06/2018  . Non-allergic rhinitis 02/06/2018  . Severe persistent asthma, uncomplicated 02/06/2018  . Pulmonary nodules 02/06/2018  . Bronchiectasis without complication (HCC) 02/06/2018  . DM type 2 causing vascular disease (HCC) 02/06/2018    History obtained from: chart review and patient.  Laura [name] is a 60 y.o. female presenting for a follow up visit.  She was last seen in May 2021.  At that time, she was doing very well on daily treatment.  We got her IgG subclasses which looks excellent.  We did discuss her COVID-19 immunizations, which she had not gotten yet.  For her rhinitis, we continue with nasal saline rinses as needed.  Her lung functions had continued to improve since being on Fasenra.  We continue with Dulera 200/5 mcg 2 puffs twice daily as well as Spiriva 1.25 mcg 2 puffs once daily.  In the interim, she has done very well.  She started working at [employer] in [employer]. She enjoys it immensely. She was working 25-30 hours during the holiday and now she is at 8 hours next week. 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   Asthma/Respiratory Symptom  History: She remains on the Symbicort and the Spiriva. Her insurance stopped covering the [facility] which was working well. She thinks that the addition of the Berna Bue has helped quite a bit. She is getting it every 8 weeks now. She has not needed antibiotics in around one year. ACT score is 15 indicating poor asthma control, but this is reflective of the increased need of albuterol lately.  Allergic Rhinitis Symptom History: She remains on the nasal saline rinses as needed. She does not feel that she needs anything more aggressiuve at this point in time.  Infection Symptom History: She has been doing her infusions and is tolerating this without a problem. She has not been on antibiotics for around one year. She denies side effects from the infusions and she continues to feel good about administering the medication herself at home. She feels that one of the reasons that she has done so well is because of the infusions.   She did do some traveling over the summer but overall she has been staying close by. She denies any COVID exposures but she was hanging out with some friends over New Years.   Otherwise, there have been no changes to her past medical history, surgical history, family history, or social history.  Late addition: The patient called on January 7th. She started coughing yesterday and having some diarrhea yesterday. She got a rapid COVID and this was positive. She does have a pulse ox at home and she has not been checking it. She has not had a fever at all. It has been around 99 degrees or so. She is interested in getting her into the infusion of monoclonals. She is doing her breathing treatments. Pulse ox is 91%. She is vaccinated but not boosted.     Review of Systems  Constitutional: Negative.  Negative for chills, fever, malaise/fatigue and weight loss.  HENT: Negative.  Negative for congestion, ear discharge, ear pain and sore throat.   Eyes: Negative for pain, discharge and redness.   Respiratory: Negative for cough, sputum production, shortness of breath and wheezing.   Cardiovascular: Negative.  Negative for chest pain and palpitations.  Gastrointestinal: Negative for abdominal pain, constipation, diarrhea, heartburn, nausea and vomiting.  Skin: Negative.  Negative for itching and rash.  Neurological: Positive for tremors. Negative for dizziness and headaches.  Endo/Heme/Allergies: Negative for environmental allergies. Does not bruise/bleed easily.       Objective:   Blood pressure 140/80, pulse 88, resp. rate 18, weight 210 lb (95.3 kg), SpO2 97 %. Body mass index is 36.05 kg/m.   Physical Exam:  Physical Exam Constitutional:      Appearance: She is well-developed.     Comments: Very pleasant female. Talkative.   HENT:     Head: Normocephalic and atraumatic.     Right Ear: Tympanic membrane, ear canal and external ear normal.     Left Ear: Tympanic membrane, ear canal and external ear normal.     Nose: No nasal deformity, septal deviation, mucosal edema, rhinorrhea or epistaxis.     Right Turbinates: Enlarged and swollen.     Left Turbinates: Enlarged and swollen.  Right Sinus: No maxillary sinus tenderness or frontal sinus tenderness.     Left Sinus: No maxillary sinus tenderness or frontal sinus tenderness.     Mouth/Throat:     Mouth: Oropharynx is clear and moist. Mucous membranes are not pale and not dry.     Pharynx: Uvula midline.  Eyes:     General:        Right eye: No discharge.        Left eye: No discharge.     Extraocular Movements: EOM normal.     Conjunctiva/sclera: Conjunctivae normal.     Right eye: Right conjunctiva is not injected. No chemosis.    Left eye: Left conjunctiva is not injected. No chemosis.    Pupils: Pupils are equal, round, and reactive to light.  Cardiovascular:     Rate and Rhythm: Normal rate and regular rhythm.     Heart sounds: Normal heart sounds.  Pulmonary:     Effort: Pulmonary effort is normal. No  tachypnea, accessory muscle usage or respiratory distress.     Breath sounds: Normal breath sounds. No wheezing, rhonchi or rales.     Comments: Decreased air movement at the bases. No crackles or wheezes noted.  Chest:     Chest wall: No tenderness.  Lymphadenopathy:     Cervical: No cervical adenopathy.  Skin:    Coloration: Skin is not pale.     Findings: No abrasion, erythema, petechiae or rash. Rash is not papular, urticarial or vesicular.  Neurological:     Mental Status: She is alert.  Psychiatric:        Mood and Affect: Mood and affect normal.        Behavior: Behavior is cooperative.      Diagnostic studies: none      Salvatore Marvel, MD  Allergy and [facility] [place]

## 2020-10-14 NOTE — Patient Instructions (Addendum)
1. Hypogammaglobulinemia - Continue with Cuvitru at the same dose. - Come back for your Moderna booster on January 26th at 11:30am.   2. Chronic rhinitis   - Continue with nasal saline rinses as needed.   3. Severe persistent asthma, uncomplicated - Lung function deferred today. - Stop the Symbicort and the Spiriva and START Breztri two puffs twice daily (contains Symbicort and a medication similar to Spiriva).  - Daily controller medication(s): Breztri two puffs twice daily with a spacer + Fasenra every 8 weeks - Prior to physical activity: ProAir 2 puffs 10-15 minutes before physical activity. - Rescue medications: ProAir 4 puffs every 4-6 hours as needed, albuterol nebulizer one vial every 4-6 hours as needed or DuoNeb nebulizer one vial every 4-6 hours as needed - Asthma control goals:  * Full participation in all desired activities (may need albuterol before activity) * Albuterol use two time or less a week on average (not counting use with activity) * Cough interfering with sleep two time or less a month * Oral steroids no more than once a year * No hospitalizations.  4. Return in about 4 months (around 02/11/2021).     Please inform us of any Emergency Department visits, hospitalizations, or changes in symptoms. Call us before going to the ED for breathing or allergy symptoms since we might be able to fit you in for a sick visit. Feel free to contact us anytime with any questions, problems, or concerns.  It was a pleasure to see you again today!  Websites that have reliable patient information: 1. American Academy of Asthma, Allergy, and Immunology: www.aaaai.org 2. Food Allergy Research and Education (FARE): foodallergy.org 3. Mothers of Asthmatics: http://www.asthmacommunitynetwork.org 4. American College of Allergy, Asthma, and Immunology: www.acaai.org   COVID-19 Vaccine Information can be found at:  PodExchange.nl For questions related to vaccine distribution or appointments, please email [email or web address] or call [phone].     "Like" [place] on Facebook and Instagram for our latest updates!       Make sure you are registered to vote! If you have moved or changed any of your contact information, you will need to get this updated before voting!  In some cases, you MAY be able to register to vote online: [email or web address]

## 2020-10-15 ENCOUNTER — Ambulatory Visit: Payer: Medicare Other | Admitting: Podiatry

## 2020-10-15 ENCOUNTER — Telehealth (HOSPITAL_COMMUNITY): Payer: Self-pay | Admitting: Physical Therapy

## 2020-10-15 ENCOUNTER — Ambulatory Visit (HOSPITAL_COMMUNITY): Payer: Medicare Other | Admitting: Physical Therapy

## 2020-10-15 DIAGNOSIS — R112 Nausea with vomiting, unspecified: Secondary | ICD-10-CM | POA: Diagnosis not present

## 2020-10-15 DIAGNOSIS — A09 Infectious gastroenteritis and colitis, unspecified: Secondary | ICD-10-CM | POA: Diagnosis not present

## 2020-10-15 DIAGNOSIS — R059 Cough, unspecified: Secondary | ICD-10-CM | POA: Diagnosis not present

## 2020-10-15 DIAGNOSIS — Z1152 Encounter for screening for COVID-19: Secondary | ICD-10-CM | POA: Diagnosis not present

## 2020-10-15 NOTE — Telephone Encounter (Signed)
pt called to cx this appt due to she has to take a covid test

## 2020-10-16 ENCOUNTER — Other Ambulatory Visit (HOSPITAL_COMMUNITY): Payer: Self-pay

## 2020-10-16 ENCOUNTER — Telehealth: Payer: Self-pay

## 2020-10-16 ENCOUNTER — Encounter: Payer: Self-pay | Admitting: Allergy & Immunology

## 2020-10-16 DIAGNOSIS — J4550 Severe persistent asthma, uncomplicated: Secondary | ICD-10-CM

## 2020-10-16 DIAGNOSIS — U071 COVID-19: Secondary | ICD-10-CM

## 2020-10-16 DIAGNOSIS — D839 Common variable immunodeficiency, unspecified: Secondary | ICD-10-CM

## 2020-10-16 NOTE — Telephone Encounter (Signed)
I called the patient over lunch and gave her the number for the infusion on call team. She took her pulse ox while I was on the phone and it was 91% (was 97% a couple of days ago). She promised to call right away.   Salvatore Marvel, MD Allergy and [facility] [place]

## 2020-10-16 NOTE — Telephone Encounter (Signed)
Patient called stating she tested positive for COVID on yesterday & wanted to let us know since she was in the office on Wednesday. Patient is wondering would she get an infusion for COVID? She is having every symptom but loss of taste/smell.  Please advise.

## 2020-10-16 NOTE — Telephone Encounter (Signed)
Patient called and left a voicemail for office you wanted her to go to, but she hasn't heard anything back.

## 2020-10-16 NOTE — Telephone Encounter (Signed)
Noted. I will follow up with her later tonight.  Salvatore Marvel, MD Allergy and [facility] [place]

## 2020-10-17 NOTE — Telephone Encounter (Signed)
I called the patient to see how she was doing. She has not received any calls from the ID Line regarding monoclonals. She is doing her breathing treatments every 4-5 hours. This is helping for a short period of time. She is doing everything that she is doing at the hospital. Pulse ox is 94-95%.  She does have something that was sent in by Dr. Nevada Crane for the methylprednisolone dose pack. She does have the Robitussin and the vitamins. She is doing everything that she is supposed to be doing.  She did call her PCP's office and no one picked up. No one   Recommendations for at home COVID-19 symptoms management:  1. Please continue isolation at home. 2. Call [phone] to see whether you might be eligible for therapeutic antibody infusions (leave your name and they will call you back).  3. If have acute worsening of symptoms please go to ER/urgent care for further evaluation. 4. Check pulse oximetry and if below 90-92% please go to ER. 5. The following supplements MAY help:  ? Vitamin C 500mg  twice a day and Quercetin 250-500 mg twice a day ? Vitamin D3 2000 - 4000 u/day ? B Complex vitamins ? Zinc 75-100 mg/day ? Melatonin 6-10 mg at night (the optimal dose is unknown) ? Aspirin 81mg /day (if no history of bleeding issues)

## 2020-10-19 ENCOUNTER — Telehealth (HOSPITAL_COMMUNITY): Payer: Self-pay | Admitting: Physical Therapy

## 2020-10-19 MED ORDER — ALBUTEROL SULFATE (2.5 MG/3ML) 0.083% IN NEBU: 2.5000 mg | INHALATION_SOLUTION | RESPIRATORY_TRACT | 1 refills | Status: DC | PRN

## 2020-10-19 NOTE — Telephone Encounter (Signed)
Patient states she left a message on the number provided for the therapeutic antibody infusion center, but no one has called her back. Patient also states that her PCP left a message for them on Thursday of last week. Patient states she has a fever and all of her other symptoms. Informed patient of recommendations, "If have acute worsening of symptoms please go to ER/urgent care for further evaluation." Patient states she does not want to go to the Er or Urgent Care. She called her PCP this morning and they will not see her with her COVID symptoms. Patient would like to know what to do regarding the infusion, because she thinks she should have already got it.   Please advise

## 2020-10-19 NOTE — Telephone Encounter (Signed)
pt cancelled appts for this week she has COVID

## 2020-10-19 NOTE — Telephone Encounter (Signed)
Talked to patient today. I told her that we had already referred her via two different methods for the infusions, but I reminded her that there were limited doses available. She understands that other patients are likely ahead of her in the line. Sats have remained in the 97% range and higher. She does have steroids already and she is requesting refills of her albuterol neb solution. I sent this in.  Salvatore Marvel, MD Allergy and [facility] [place]

## 2020-10-19 NOTE — Telephone Encounter (Signed)
Patient states she left a message on the number provided for the therapeutic antibody infusion center, but no one has called her back. Patient also states that her PCP left a message for them on Thursday of last week. Patient states she has a fever and all of her other symptoms. Informed patient of recommendations, "If have acute worsening of symptoms please go to ER/urgent care for further evaluation." Patient states she does not want to go to the Er or Urgent Care. She called her PCP this morning and they will not see her with her COVID symptoms. Patient would like to know what to do regarding the infusion, because she thinks she should have already got it.   Please advise 

## 2020-10-19 NOTE — Addendum Note (Signed)
Addended by: Valentina Shaggy on: 10/19/2020 05:13 PM   Modules accepted: Orders

## 2020-10-20 NOTE — Telephone Encounter (Signed)
Patient contacted me again this morning.  She has continued to have shortness of breath which she feels is getting worse.  She remains on albuterol every 4 hours.  Pulse ox has been 94%, getting up to 96% during the day.  She has been afebrile.  I put in an order for COVID treatment.  Hopefully they will reach out to her.  I have previously given her the phone number and she has called twice.  I also sent an email to the treatment team.  Salvatore Marvel, MD Allergy and [place]

## 2020-10-20 NOTE — Addendum Note (Signed)
Addended by: Valentina Shaggy on: 10/20/2020 09:35 AM   Modules accepted: Orders

## 2020-10-21 ENCOUNTER — Ambulatory Visit (HOSPITAL_COMMUNITY): Payer: Medicare Other | Admitting: Physical Therapy

## 2020-10-21 MED ORDER — NEBULIZER MISC: 5 refills | Status: DC

## 2020-10-21 NOTE — Addendum Note (Signed)
Addended by: Lucrezia Starch I on: 10/21/2020 03:40 PM   Modules accepted: Orders

## 2020-10-22 ENCOUNTER — Encounter (HOSPITAL_COMMUNITY): Payer: Medicare Other | Admitting: Physical Therapy
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
## 2020-10-27 ENCOUNTER — Other Ambulatory Visit: Payer: Self-pay | Admitting: *Deleted

## 2020-10-27 MED ORDER — ALBUTEROL SULFATE (2.5 MG/3ML) 0.083% IN NEBU: 2.5000 mg | INHALATION_SOLUTION | RESPIRATORY_TRACT | 1 refills | Status: DC | PRN

## 2020-10-28 ENCOUNTER — Ambulatory Visit (HOSPITAL_COMMUNITY): Payer: Medicaid Other | Admitting: Clinical

## 2020-10-30 ENCOUNTER — Telehealth (HOSPITAL_COMMUNITY): Payer: Medicare Other | Admitting: Psychiatry

## 2020-10-30 ENCOUNTER — Encounter: Payer: Self-pay | Admitting: Psychiatry

## 2020-10-30 ENCOUNTER — Other Ambulatory Visit: Payer: Self-pay

## 2020-10-30 ENCOUNTER — Telehealth (INDEPENDENT_AMBULATORY_CARE_PROVIDER_SITE_OTHER): Payer: Medicare Other | Admitting: Psychiatry

## 2020-10-30 DIAGNOSIS — F330 Major depressive disorder, recurrent, mild: Secondary | ICD-10-CM

## 2020-10-30 MED ORDER — DIVALPROEX SODIUM ER 500 MG PO TB24
ORAL_TABLET | ORAL | 0 refills | Status: DC
Start: 2020-11-06 — End: 2021-01-29

## 2020-10-30 NOTE — Patient Instructions (Signed)
1.ContinueDepakote500 mg in AM, 1000 mg at night 2.Next appointment:4/22 at 11 AM

## 2020-11-02 DIAGNOSIS — M25562 Pain in left knee: Secondary | ICD-10-CM | POA: Diagnosis not present

## 2020-11-02 DIAGNOSIS — Z20822 Contact with and (suspected) exposure to covid-19: Secondary | ICD-10-CM | POA: Diagnosis not present

## 2020-11-02 DIAGNOSIS — J45909 Unspecified asthma, uncomplicated: Secondary | ICD-10-CM | POA: Diagnosis not present

## 2020-11-02 DIAGNOSIS — F319 Bipolar disorder, unspecified: Secondary | ICD-10-CM | POA: Diagnosis not present

## 2020-11-02 DIAGNOSIS — M545 Low back pain, unspecified: Secondary | ICD-10-CM | POA: Diagnosis not present

## 2020-11-02 DIAGNOSIS — R252 Cramp and spasm: Secondary | ICD-10-CM | POA: Diagnosis not present

## 2020-11-02 DIAGNOSIS — M25512 Pain in left shoulder: Secondary | ICD-10-CM | POA: Diagnosis not present

## 2020-11-02 DIAGNOSIS — G9009 Other idiopathic peripheral autonomic neuropathy: Secondary | ICD-10-CM | POA: Diagnosis not present

## 2020-11-02 DIAGNOSIS — R944 Abnormal results of kidney function studies: Secondary | ICD-10-CM | POA: Diagnosis not present

## 2020-11-02 DIAGNOSIS — D803 Selective deficiency of immunoglobulin G [IgG] subclasses: Secondary | ICD-10-CM | POA: Diagnosis not present

## 2020-11-02 DIAGNOSIS — I509 Heart failure, unspecified: Secondary | ICD-10-CM | POA: Diagnosis not present

## 2020-11-02 DIAGNOSIS — R197 Diarrhea, unspecified: Secondary | ICD-10-CM | POA: Diagnosis not present

## 2020-11-02 LAB — BASIC METABOLIC PANEL
BUN: 19 (ref 4–21)
Chloride: 104 (ref 99–108)
Creatinine: 1.1 (ref 0.5–1.1)
Glucose: 138
Potassium: 4.2 (ref 3.4–5.3)
Sodium: 140 (ref 137–147)

## 2020-11-02 LAB — COMPREHENSIVE METABOLIC PANEL
Albumin: 4 (ref 3.5–5.0)
Calcium: 8.4 — AB (ref 8.7–10.7)
GFR calc Af Amer: 63
GFR calc non Af Amer: 54
Globulin: 2.6

## 2020-11-02 LAB — HEPATIC FUNCTION PANEL
ALT: 8 (ref 7–35)
AST: 15 (ref 13–35)
Alkaline Phosphatase: 69 (ref 25–125)
Bilirubin, Total: 0.2

## 2020-11-02 LAB — VITAMIN D 25 HYDROXY (VIT D DEFICIENCY, FRACTURES): Vit D, 25-Hydroxy: 16.5

## 2020-11-02 LAB — HEMOGLOBIN A1C: Hemoglobin A1C: 7.7

## 2020-11-02 LAB — VITAMIN B12: Vitamin B-12: 340

## 2020-11-02 NOTE — Progress Notes (Unsigned)
Cardiology Office Note    Date:  11/03/2020   ID:  Laura [name], DOB November 06, 1960, MRN [id number]  PCP:  [name], MD  Cardiologist: Carlyle Dolly, MD    Chief Complaint  Patient presents with  . Follow-up    6 month visit    History of Present Illness:    Laura [name] is a 60 y.o. female with past medical history ofPAF (s/p DCCV in early 2000's - recurrent atrial flutter in 07/2018 and s/p DCCV in 08/2018 and recurrence since with rate-control pursued), normal cors by cath in 99/3570, chronic diastolic CHF, prior ASD repair (at 60 years of age), IDDM, HTN, OSA, severe persistent asthma, and Bipolar Disorderwho presents to the office today for 11-monthfollow-up.   She was last examined by Dr. BHarl Bowiein 05/2020 and had been dieting with weight down to 228 lbs. She reported occasional palpitations and Cardizem CD was increased to 3627mdaily.   In talking with the patient today, she reports she was diagnosed with COVID-19 earlier this month and was treated with steroids and a Z-Pak by her Immunologist. She has been taking Robitussin-DM regularly. She continues to have dyspnea on exertion and overall feels very fatigued. She also reports a decreased appetite and has lost 24 lbs since her visit in 05/2020 due to this along with dietary changes. She denies any specific exertional chest pain or palpitations. No recent orthopnea, PND or edema.   Past Medical History:  Diagnosis Date  . Asthma   . Atrial fibrillation ([place]  . Bipolar disorder (HC[place]  . Bronchiectasis (HC[facility]  . CHF (congestive heart failure) (HC[place]  . Complication of [place]    [place] an [place] combination of the pain meds and the anesthesia" she does not remember what happened but "I eneded up in the ICU for 5 days".  . H/O congenital atrial septal defect (ASD) repair   . Hypogammaglobulinemia ([place]CR[place]  . Hypothyroid   . IgE deficiency (HCP[place]  . Neuropathy   .  Osteoarthritis   . PTSD (post-traumatic stress disorder)   . Recurrent sinusitis   . Short-term memory loss   . Sleep apnea   . TIA (transient ischemic attack)   . Tremors of nervous system    seeing PCP for this.  . Type 2 diabetes mellitus (HCHel[place]  . Urticaria   . Wound infection     Past Surgical History:  Procedure Laterality Date  . 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 CARDIAC SURGERY    . CARDIOVERSION    . CARDIOVERSION N/A 08/29/2018   Procedure: CARDIOVERSION;  Surgeon: BrArnoldo LenisMD;  Location[facility];  Service: Endoscopy;  Laterality: N/A;  . [place]. LEFT HEART CATH AND CORONARY ANGIOGRAPHY N/A 08/30/2019   Procedure: LEFT HEART CATH AND CORONARY ANGIOGRAPHY;  Surgeon: VaJettie BoozeMD;  [facility];  Service: Cardiovascular;  Laterality: N/A;  . NASAL SINUS SURGERY  2017    Current Medications: Outpatient Medications Prior to Visit  Medication Sig Dispense Refill  . acetaminophen (TYLENOL) 325 MG tablet Take 2 tablets (650 mg total) by mouth every 6 (six) hours as needed for mild pain, fever or headache. 30 tablet 1  . albuterol (PROVENTIL) (2.5 MG/3ML) 0.083% nebulizer solution Take 3 mLs (2.5 mg total) by nebulization every 4 (four) hours as needed for wheezing or shortness of  breath. 75 mL 1  . albuterol (VENTOLIN HFA) 108 (90 Base) MCG/ACT inhaler INHALE 2 PUFFS INTO THE LUNGS EVERY 6 HOURS AS NEEDED FOR WHEEZING OR SHORTNESS OF BREATH 18 g 1  . apixaban (ELIQUIS) 5 MG TABS tablet Take 5 mg by mouth 2 (two) times daily.    Marland Kitchen atorvastatin (LIPITOR) 10 MG tablet Take 10 mg by mouth every evening.     . BD PEN NEEDLE NANO 2ND GEN 32G X 4 MM MISC SMARTSIG:Injection 4 Times Daily    . Benralizumab (FASENRA PEN) 30 MG/ML SOAJ Inject 30 mg into the skin every 8 (eight) weeks. 1 mL 6  . budesonide-formoterol (SYMBICORT) 160-4.5 MCG/ACT inhaler Inhale 2 puffs into the lungs 2 (two) times daily. 1 Inhaler 5  . diltiazem  (CARDIZEM CD) 360 MG 24 hr capsule Take 1 capsule (360 mg total) by mouth daily. 90 capsule 3  . [START ON 11/06/2020] divalproex (DEPAKOTE ER) 500 MG 24 hr tablet 500 mg in AM, 1000 mg at night 270 tablet 0  . Dulaglutide 1.5 MG/0.5ML SOPN Inject 1.5 mg into the skin every Sunday.     Marland Kitchen EPINEPHrine 0.3 mg/0.3 mL IJ SOAJ injection     . gabapentin (NEURONTIN) 600 MG tablet Take 300 mg by mouth 3 (three) times daily.     Marland Kitchen HUMALOG KWIKPEN 100 UNIT/ML KwikPen Inject 7 Units into the skin 3 (three) times daily. Sliding scale.    . hydrALAZINE (APRESOLINE) 25 MG tablet Take 25 mg by mouth 3 (three) times daily.    . hydrOXYzine (ATARAX/VISTARIL) 25 MG tablet Take 1 tablet (25 mg total) by mouth every 6 (six) hours as needed for anxiety or nausea. 30 tablet 0  . Immune Globulin, Human, 4 GM/20ML SOLN Inject 100 mLs into the skin every 14 (fourteen) days.    Marland Kitchen LANTUS SOLOSTAR 100 UNIT/ML Solostar Pen Inject 45 Units into the skin.     Marland Kitchen levothyroxine (SYNTHROID, LEVOTHROID) 112 MCG tablet Take 112 mcg by mouth daily before breakfast.    . Magnesium Oxide 400 MG CAPS Take 1 capsule (400 mg total) by mouth daily. 90 capsule 3  . Melatonin 3 MG TABS Take 3 mg by mouth at bedtime.    . metFORMIN (GLUCOPHAGE) 500 MG tablet Take 1,000 mg by mouth 2 (two) times daily.    . Nebulizer MISC Nebulizer tubing kit 2 each 5  . omeprazole (PRILOSEC) 20 MG capsule TAKE 1 CAPSULE(20 MG) BY MOUTH DAILY 90 capsule 1  . ondansetron (ZOFRAN) 4 MG tablet Take 1 tablet (4 mg total) by mouth every 8 (eight) hours as needed for nausea or vomiting. 20 tablet 0  . ONETOUCH ULTRA test strip USE TO CHECK BLOOD SUGAR FOUR TIMES DAILY    . potassium chloride SA (KLOR-CON) 20 MEQ tablet Take 20 mEq by mouth daily.    . Tiotropium Bromide Monohydrate (SPIRIVA RESPIMAT) 1.25 MCG/ACT AERS Inhale 2 puffs into the lungs daily. 4 g 5  . topiramate (TOPAMAX) 100 MG tablet Take 1 tablet (100 mg total) by mouth daily. 90 tablet 1  . torsemide  (DEMADEX) 20 MG tablet Take 60 mg daily 90 tablet 3  . montelukast (SINGULAIR) 10 MG tablet Take 1 tablet (10 mg total) by mouth at bedtime. 30 tablet 11   Facility-Administered Medications Prior to Visit  Medication Dose Route Frequency Provider Last Rate Last Admin  . Benralizumab SOSY 30 mg  30 mg Subcutaneous Q28 days Valentina Shaggy, MD   30 mg at 07/03/20  1017     Allergies:   Ativan [lorazepam] and Phenergan [promethazine hcl]   Social History   Socioeconomic History  . Marital status: Widowed    Spouse name: Not on file  . Number of children: Not on file  . Years of education: Not on file  . Highest education level: Not on file  Occupational History  . Not on file  Tobacco Use  . Smoking status: Former Smoker    Packs/day: 1.00    Years: 28.00    Pack years: 28.00    Types: Cigarettes    Start date: 58    Quit date: 2008    Years since quitting: 14.0  . Smokeless tobacco: Never Used  Vaping Use  . Vaping Use: Never used  Substance and Sexual Activity  . Alcohol use: Not Currently  . Drug use: Not Currently  . Sexual activity: Yes    Birth control/protection: None  Other Topics Concern  . Not on file  Social History Narrative  . Not on file   Social Determinants of Health   Financial Resource Strain: Not on file  Food Insecurity: Not on file  Transportation Needs: Not on file  Physical Activity: Not on file  Stress: Not on file  Social Connections: Not on file     Family History:  The patient's family history includes Alcohol abuse in her mother; Allergic rhinitis in her daughter; Bipolar disorder in her sister; Celiac disease in her daughter; Hashimoto's thyroiditis in her daughter; Heart attack in her mother; Heart disease in her brother and maternal aunt; Hypertension in her mother and sister; Kidney disease in her mother and sister; Stroke in her maternal aunt and mother; Thyroid disease in her daughter, daughter, daughter, and sister.   Review  of Systems:   Please see the history of present illness.     General:  No chills, fever or night sweats. Positive for fatigue and weight loss.  Cardiovascular:  No chest pain, edema, orthopnea, palpitations, paroxysmal nocturnal dyspnea. Positive for dyspnea on exertion.  Dermatological: No rash, lesions/masses Respiratory: Positive for cough and dyspnea. Urologic: No hematuria, dysuria Abdominal:   No nausea, vomiting, diarrhea, bright red blood per rectum, melena, or hematemesis Neurologic:  No visual changes, wkns, changes in mental status. All other systems reviewed and are otherwise negative except as noted above.   Physical Exam:    VS:  BP (!) 142/80   Pulse 95   Ht 5' 4" (1.626 m)   Wt 204 lb 9.6 oz (92.8 kg)   SpO2 97%   BMI 35.12 kg/m    General: Well developed, well nourished,female appearing in no acute distress. Head: Normocephalic, atraumatic. Neck: No carotid bruits. JVD not elevated.  Lungs: Respirations regular and unlabored, without wheezes or rales.  Heart: Irregularly irregular. No S3 or S4.  No murmur, no rubs, or gallops appreciated. Abdomen: Appears non-distended. No obvious abdominal masses. Msk:  Strength and tone appear normal for age. No obvious joint deformities or effusions. Extremities: No clubbing or cyanosis. No lower extremity edema.  Distal pedal pulses are 2+ bilaterally. Neuro: Alert and oriented X 3. Moves all extremities spontaneously. No focal deficits noted. Psych:  Responds to questions appropriately with a normal affect. Skin: No rashes or lesions noted  Wt Readings from Last 3 Encounters:  11/03/20 204 lb 9.6 oz (92.8 kg)  10/14/20 210 lb (95.3 kg)  08/26/20 222 lb (100.7 kg)      Studies/Labs Reviewed:   EKG:  EKG is ordered  today. The ekg ordered today demonstrates rate-controlled atrial fibrillation, HR 89 with baseline artifact but no acute ST changes when compared to prior tracings.  Recent Labs: 08/13/2020: ALT 14; BUN 15;  Creatinine, Ser 0.76; Hemoglobin 13.7; Platelets 217; Potassium 4.5; Sodium 137   Lipid Panel    Component Value Date/Time   CHOL 137 08/29/2019 0718   TRIG 101 08/29/2019 0718   HDL 52 08/29/2019 0718   CHOLHDL 2.6 08/29/2019 0718   VLDL 20 08/29/2019 0718   LDLCALC 65 08/29/2019 0718    Additional studies/ records that were reviewed today include:   Cardiac Catheterization: 62/6948  LV end diastolic pressure is mildly elevated.  There is no aortic valve stenosis.  Aortic atherosclerosis/calcification noted.  No angiographically apparently CAD.   Continue risk factor modification.  Patient requests to spend the night.  She lives in [place].    Restart Eliquis tomorrow.  Check BMet in AM.  Likely discharge tomorrow.    Echocardiogram: 08/2019 IMPRESSIONS    1. Left ventricular ejection fraction, by visual estimation, is 60 to  65%. The left ventricle has normal function. There is mildly increased  left ventricular hypertrophy.  2. Elevated left ventricular end-diastolic pressure.  3. Left ventricular diastolic function could not be evaluated.  4. Global right ventricle has mildly reduced systolic function.The right  ventricular size is normal. No increase in right ventricular wall  thickness.  5. Left atrial size was mild-moderately dilated.  6. Right atrial size was normal.  7. Moderate to severe mitral annular calcification.  8. The mitral valve is degenerative. Trace mitral valve regurgitation.  9. The tricuspid valve is grossly normal. Tricuspid valve regurgitation  is trivial.  10. The aortic valve is tricuspid. Aortic valve regurgitation is not  visualized. Mild aortic valve sclerosis without stenosis.  11. The pulmonic valve was grossly normal. Pulmonic valve regurgitation is  not visualized.  12. The inferior vena cava is normal in size with greater than 50%  respiratory variability, suggesting right atrial pressure of 3 mmHg.    Assessment:    1. PAF (paroxysmal atrial fibrillation) ([place])   2. DOE (dyspnea on exertion)   3. Bilateral lower extremity edema   4. Essential hypertension, benign      Plan:   In order of problems listed above:  1. Paroxysmal Atrial Fibrillation - She denies any recent palpitations and HR is well-controlled in the 80's to 90's during today's visit. Will continue Cardizem CD 353m daily for rate-control. - She denies any evidence of active bleeding. Remains on Eliquis 516mBID for anticoagulation. She reports having labs with her PCP yesterday and I will request a copy of the records.   2. Dyspnea on Exertion/Recent COVID-19 Diagnosis - She has continued to experience fatigue and dyspnea on exertion since being diagnosed with COVID-19 earlier this month. Oxygen saturations remain appropriate. Was treated with steroids and antibiotic therapy by her Immunologist. Given her persistent symptoms, will order an echocardiogram to rule-out a viral cardiomyopathy. Ischemia less likely given normal cors by cath in 08/2019.  3. Lower Extremity Edema - She reports symptoms have recently been well-controlled and her main issue at this time is overactive bladder. She does appear euvolemic by examination today, therefore will decrease Torsemide from 6042maily to 55m76mily.   4. HTN - BP was elevated to 142/80 during today's visit but she has been taking Robitussin-DM regularly. I recommended she use plain Mucinex instead of DM products given her known HTN and atrial fibrillation.  -  Continue current regimen for now with Cardizem CD 331m daily and Hydralazine 221mTID.    Medication Adjustments/Labs and Tests Ordered: Current medicines are reviewed at length with the patient today.  Concerns regarding medicines are outlined above.  Medication changes, Labs and Tests ordered today are listed in the Patient Instructions below. Patient Instructions  Medication Instructions:  Your physician has  recommended you make the following change in your medication:   Decrease Torsemide to 40 mg Daily   *If you need a refill on your cardiac medications before your next appointment, please call your pharmacy*   Lab Work: NONE   If you have labs (blood work) drawn today and your tests are completely normal, you will receive your results only by: . Marland KitchenyChart Message (if you have MyChart) OR . A paper copy in the mail If you have any lab test that is abnormal or we need to change your treatment, we will call you to review the results.   Testing/Procedures: Your physician has requested that you have an echocardiogram. Echocardiography is a painless test that uses sound waves to create images of your heart. It provides your doctor with information about the size and shape of your heart and how well your heart's chambers and valves are working. This procedure takes approximately one hour. There are no restrictions for this procedure.     Follow-Up: [facility].  As part of our continuing mission to provide you with exceptional heart care, we have created designated Provider Care Teams.  These Care Teams include your primary Cardiologist (physician) and Advanced Practice Providers (APPs -  Physician Assistants and Nurse Practitioners) who all work together to provide you with the care you need, when you need it.  We recommend signing up for the patient portal called "MyChart".  Sign up information is provided on this After Visit Summary.  MyChart is used to connect with patients for Virtual Visits (Telemedicine).  Patients are able to view lab/test results, encounter notes, upcoming appointments, etc.  Non-urgent messages can be sent to your provider as well.   To learn more about what you can do with MyChart, go to [email or web address]   Your next appointment:   6 month(s)  The format for your next appointment:   In Person  Provider:    JoCarlyle DollyMD   Other Instructions Thank you for choosing [facility]      Signed, BrErma HeritagePA-C  11/03/2020 8:17 PM    [place]. [id number] [id number]: [phone]: [phone]

## 2020-11-03 ENCOUNTER — Encounter: Payer: Self-pay | Admitting: Student

## 2020-11-03 ENCOUNTER — Ambulatory Visit (INDEPENDENT_AMBULATORY_CARE_PROVIDER_SITE_OTHER): Payer: Medicare Other | Admitting: Student

## 2020-11-03 ENCOUNTER — Other Ambulatory Visit: Payer: Self-pay

## 2020-11-03 ENCOUNTER — Encounter: Payer: Self-pay | Admitting: *Deleted

## 2020-11-03 VITALS — BP 142/80 | HR 95 | Ht 64.0 in | Wt 204.6 lb

## 2020-11-03 DIAGNOSIS — R600 Localized edema: Secondary | ICD-10-CM | POA: Diagnosis not present

## 2020-11-03 DIAGNOSIS — R0600 Dyspnea, unspecified: Secondary | ICD-10-CM | POA: Diagnosis not present

## 2020-11-03 DIAGNOSIS — I480 Paroxysmal atrial fibrillation: Secondary | ICD-10-CM | POA: Diagnosis not present

## 2020-11-03 DIAGNOSIS — I10 Essential (primary) hypertension: Secondary | ICD-10-CM | POA: Diagnosis not present

## 2020-11-03 DIAGNOSIS — R0609 Other forms of dyspnea: Secondary | ICD-10-CM

## 2020-11-03 MED ORDER — TORSEMIDE 20 MG PO TABS: 40.0000 mg | ORAL_TABLET | Freq: Every day | ORAL | 3 refills | Status: DC

## 2020-11-03 NOTE — Patient Instructions (Signed)
Medication Instructions:  Your physician has recommended you make the following change in your medication:   Decrease Torsemide to 40 mg Daily   *If you need a refill on your cardiac medications before your next appointment, please call your pharmacy*   Lab Work: NONE   If you have labs (blood work) drawn today and your tests are completely normal, you will receive your results only by: Marland Kitchen MyChart Message (if you have MyChart) OR . A paper copy in the mail If you have any lab test that is abnormal or we need to change your treatment, we will call you to review the results.   Testing/Procedures: Your physician has requested that you have an echocardiogram. Echocardiography is a painless test that uses sound waves to create images of your heart. It provides your doctor with information about the size and shape of your heart and how well your heart's chambers and valves are working. This procedure takes approximately one hour. There are no restrictions for this procedure.     Follow-Up: At [facility], you and your health needs are our priority.  As part of our continuing mission to provide you with exceptional heart care, we have created designated Provider Care Teams.  These Care Teams include your primary Cardiologist (physician) and Advanced Practice Providers (APPs -  Physician Assistants and Nurse Practitioners) who all work together to provide you with the care you need, when you need it.  We recommend signing up for the patient portal called "MyChart".  Sign up information is provided on this After Visit Summary.  MyChart is used to connect with patients for Virtual Visits (Telemedicine).  Patients are able to view lab/test results, encounter notes, upcoming appointments, etc.  Non-urgent messages can be sent to your provider as well.   To learn more about what you can do with MyChart, go to [email or web address].    Your next appointment:   6 month(s)  The format for your  next appointment:   In Person  Provider:   Carlyle Dolly, MD   Other Instructions Thank you for choosing [facility]!

## 2020-11-04 ENCOUNTER — Ambulatory Visit (HOSPITAL_COMMUNITY): Payer: Medicare Other | Admitting: Physical Therapy

## 2020-11-04 ENCOUNTER — Ambulatory Visit: Payer: Medicare Other

## 2020-11-04 ENCOUNTER — Encounter (HOSPITAL_COMMUNITY): Payer: Self-pay | Admitting: Physical Therapy

## 2020-11-04 DIAGNOSIS — M25552 Pain in left hip: Secondary | ICD-10-CM | POA: Diagnosis not present

## 2020-11-04 DIAGNOSIS — M545 Low back pain, unspecified: Secondary | ICD-10-CM

## 2020-11-04 DIAGNOSIS — M79605 Pain in left leg: Secondary | ICD-10-CM | POA: Diagnosis not present

## 2020-11-04 NOTE — Therapy (Addendum)
[facility] [address] [place], [place], [address] Phone: [phone]   Fax:  [phone]  Physical Therapy Treatment, Progress Note and Discharge note  Patient Details  Name: Laura [name] MRN: [id number] Date of Birth: [birth year] Referring Provider (PT): Salvatore Marvel MD   Progress Note Reporting Period 09/23/20 to 11/04/20  See note below for Objective Data and Assessment of Progress/Goals.     PHYSICAL THERAPY DISCHARGE SUMMARY  Visits from Start of Care: 4  Current functional level related to goals / functional outcomes: 1/1 short term goal met, no long term goals met    Remaining deficits: Continued pain, ROM and strength deficits   Education / Equipment: See below Plan: Patient agrees to discharge.  Patient goals were not met. Patient is being discharged due to lack of progress.  ?????        Encounter Date: 11/04/2020   PT End of Session - 11/04/20 1403    Visit Number 4    Number of Visits 12    Date for PT Re-Evaluation 11/06/20    Authorization Type Medicare A, Medca[employer] 2nd    PT Start Time 1402    PT Stop Time 1440    PT Time Calculation (min) 38 min    Activity Tolerance Patient tolerated treatment well    Behavior During Therapy WFL for tasks assessed/performed           Past Medical History:  Diagnosis Date  . Asthma   . Atrial fibrillation (HCC)   . Bipolar disorder (HCC)   . Bronchiectasis (HCC)   . CHF (congestive heart failure) (HCC)   . Complication of anesthesia    "my Dr in [place] told me I had an allergic reaction to the combination of the pain meds and the anesthesia" she does not remember what happened but "I eneded up in the ICU for 5 days".  . H/O congenital atrial septal defect (ASD) repair   . Hypogammaglobulinemia (HCC)   . Hypothyroid   . IgE deficiency (HCC)   . Neuropathy   . Osteoarthritis   . PTSD (post-traumatic stress disorder)   . Recurrent sinusitis   .  Short-term memory loss   . Sleep apnea   . TIA (transient ischemic attack)   . Tremors of nervous system    seeing PCP for this.  . Type 2 diabetes mellitus (HCC)   . Urticaria   . Wound infection     Past Surgical History:  Procedure Laterality Date  . 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 CARDIAC SURGERY    . CARDIOVERSION    . CARDIOVERSION N/A 08/29/2018   Procedure: CARDIOVERSION;  Surgeon: Antoine Poche, MD;  Location: [facility];  Service: Endoscopy;  Laterality: N/A;  . CESAREAN SECTION  1986, 1988, 1994  . LEFT HEART CATH AND CORONARY ANGIOGRAPHY N/A 08/30/2019   Procedure: LEFT HEART CATH AND CORONARY ANGIOGRAPHY;  Surgeon: Corky Crafts, MD;  Location: [facility] INVASIVE CV LAB;  Service: Cardiovascular;  Laterality: N/A;  . NASAL SINUS SURGERY  2017    There were no vitals filed for this visit.   Subjective Assessment - 11/04/20 1411    Subjective States that her back has been not good. She is in a lot of pain and feels like she is scrunched from her neck all the way down into her right buttocks. States every time she moves it feels like it catches. States she sees her MD tomorrow but doesn't know if they will  schedule her MRI or what. States that she likes the stretching with standing (lumbar extension), lumbar trunk rotation and lumbar side bending.  States laying on her stomach is challenging because of her neck.    Limitations Sitting;Standing;Walking;House hold activities;Lifting    Diagnostic tests xrays    Patient Stated Goals To be healed, be able to move    Currently in Pain? Yes    Pain Score 8     Pain Location Back    Pain Orientation Right;Lateral;Lower    Pain Descriptors / Indicators Shooting    Pain Radiating Towards to rigth buttocks    Pain Onset More than a month ago    Pain Onset More than a month ago              [facility] PT Assessment - 11/04/20 0001      Assessment   Medical Diagnosis LBP, LT hip, LT leg pain    Referring Provider (PT) Elsie Saas MD      Observation/Other Assessments   Focus on Therapeutic Outcomes (FOTO)  36% function   was 49% function     AROM   Lumbar Flexion 50%limited   pain on right side   Lumbar Extension 75% limited   pain on right side   Lumbar - Right Side Bend 100% limited   pulls on left side   Lumbar - Left Side Bend 75% limited   hurts on left side   Lumbar - Right Rotation 75% limited   hurts on right side.   Lumbar - Left Rotation 75% limited   hurts in center of back     Strength   Right Hip Flexion 3+/5   pain in hip   Left Hip Flexion 4-/5    Right Knee Flexion 4+/5    Right Knee Extension 4/5    Left Knee Flexion 4/5    Left Knee Extension 4+/5    Right Ankle Dorsiflexion 4+/5    Left Ankle Dorsiflexion 3+/5                                 PT Education - 11/04/20 1436    Education Details on FOTO score, on COVID and how it can reduce function and increase back symptoms, on lack of progress in PT, on following up with MD, on My Chart and how to access notes.    Person(s) Educated Patient    Methods Explanation    Comprehension Verbalized understanding            PT Short Term Goals - 11/04/20 1411      PT SHORT TERM GOAL #1   Title Patient will be independent with initial HEP and self-management strategies to improve functional outcomes    Time 3    Period Weeks    Status Achieved    Target Date 10/16/20             PT Long Term Goals - 11/04/20 1412      PT LONG TERM GOAL #1   Title Patient will improve FOTO score by 10% to indicate improvement in functional outcomes    Baseline FOTO score reduced    Time 6    Period Weeks    Status On-going      PT LONG TERM GOAL #2   Title Patient will report at least 70% overall improvement in subjective complaint to indicate improvement in ability to perform ADLs.  Baseline 15% better    Time 6    Period Weeks    Status On-going      PT LONG TERM GOAL #3   Title Patient will have equal  to or > 4+/5 MMT throughout BLE to improve ability to perform functional mobility, stair ambulation and ADLs.    Time 6    Period Weeks    Status On-going                 Plan - 11/04/20 1404    Clinical Impression Statement Patient presents to therapy for progress note on this date. Patient with recent COVID 19 infection that has increased lumbar symptoms and reduced patient's overall tolerance to activity and exercises. 1/1 short term goal met but no long term goals met at this time. Patient educated in current presentation and encouraged to follow up with MD about lack of progress while in PT and continued pain. Will discharge patient from PT at this time to HEP with instructions to follow up with MD.    Personal Factors and Comorbidities Comorbidity 3+    Comorbidities COPD, Asthma, HTN, A-fib, DM    Stability/Clinical Decision Making Stable/Uncomplicated    Rehab Potential Good    PT Frequency 2x / week    PT Duration 6 weeks    PT Treatment/Interventions ADLs/Self Care Home Management;Aquatic Therapy;Biofeedback;Canalith Repostioning;Cryotherapy;Electrical Stimulation;Iontophoresis 4mg /ml Dexamethasone;Moist Heat;Traction;Balance training;Manual lymph drainage;Manual techniques;Vestibular;Vasopneumatic Device;Taping;Splinting;Orthotic Fit/Training;Energy conservation;Stair training;Functional mobility training;Therapeutic activities;Therapeutic exercise;Gait training;DME Instruction;Patient/family education;Passive range of motion;Dry needling;Joint Manipulations;Spinal Manipulations;Compression bandaging;Visual/perceptual remediation/compensation;Scar mobilization;Neuromuscular re-education;Ultrasound;Parrafin;Fluidtherapy;Contrast Bath    PT Next Visit Plan pt to DC to HEP and follow up with MD    PT Home Exercise Plan Eval: ab set, LTR; lumbar extension standing, long exhale breathing    Consulted and Agree with Plan of Care Patient           Patient will benefit from skilled  therapeutic intervention in order to improve the following deficits and impairments:  Abnormal gait,Impaired flexibility,Postural dysfunction,Improper body mechanics,Decreased range of motion,Decreased balance,Decreased mobility,Difficulty walking,Pain,Increased fascial restricitons,Decreased strength,Decreased activity tolerance,Hypomobility  Visit Diagnosis: Low back pain, unspecified back pain laterality, unspecified chronicity, unspecified whether sciatica present  Pain in left hip  Pain in left leg     Problem List Patient Active Problem List   Diagnosis Date Noted  . Abnormal EKG   . Chronic diastolic (congestive) heart failure ([place]) 08/29/2019  . Shortness of breath 08/29/2019  . Bipolar disorder ([place]) 08/10/2018  . Headache 08/10/2018  . H/O atrial flutter 08/10/2018  . OSA on CPAP 08/10/2018  . Anxiety 08/10/2018  . Uncontrolled type 2 diabetes mellitus with hyperglycemia ([place]) 03/13/2018  . Hypothyroidism 03/13/2018  . Essential hypertension, benign 03/13/2018  . Mixed hyperlipidemia 03/13/2018  . Hypogammaglobulinemia ([place]) 02/06/2018  . Non-allergic rhinitis 02/06/2018  . Severe persistent asthma, uncomplicated 62/94/7654  . Pulmonary nodules 02/06/2018  . Bronchiectasis without complication ([place]) 65/12/5463  . DM type 2 causing vascular disease ([place]) 02/06/2018    2:47 PM, 11/04/20 Jerene Pitch, DPT Physical Therapy with [facility]  [phone] office  [place] [address] [place], [place], [address] Phone: [phone]   Fax:  [phone]  Name: Kierstan Auer MRN: [id number] Date of Birth: 12-27-1960

## 2020-11-05 DIAGNOSIS — E782 Mixed hyperlipidemia: Secondary | ICD-10-CM | POA: Diagnosis not present

## 2020-11-05 DIAGNOSIS — E1142 Type 2 diabetes mellitus with diabetic polyneuropathy: Secondary | ICD-10-CM | POA: Diagnosis not present

## 2020-11-05 DIAGNOSIS — I482 Chronic atrial fibrillation, unspecified: Secondary | ICD-10-CM | POA: Diagnosis not present

## 2020-11-05 DIAGNOSIS — D519 Vitamin B12 deficiency anemia, unspecified: Secondary | ICD-10-CM | POA: Diagnosis not present

## 2020-11-05 DIAGNOSIS — F0630 Mood disorder due to known physiological condition, unspecified: Secondary | ICD-10-CM | POA: Diagnosis not present

## 2020-11-05 DIAGNOSIS — F4310 Post-traumatic stress disorder, unspecified: Secondary | ICD-10-CM | POA: Diagnosis not present

## 2020-11-05 DIAGNOSIS — G9009 Other idiopathic peripheral autonomic neuropathy: Secondary | ICD-10-CM | POA: Diagnosis not present

## 2020-11-05 DIAGNOSIS — I10 Essential (primary) hypertension: Secondary | ICD-10-CM | POA: Diagnosis not present

## 2020-11-05 DIAGNOSIS — I5032 Chronic diastolic (congestive) heart failure: Secondary | ICD-10-CM | POA: Diagnosis not present

## 2020-11-05 DIAGNOSIS — E559 Vitamin D deficiency, unspecified: Secondary | ICD-10-CM | POA: Diagnosis not present

## 2020-11-05 DIAGNOSIS — J4550 Severe persistent asthma, uncomplicated: Secondary | ICD-10-CM | POA: Diagnosis not present

## 2020-11-05 DIAGNOSIS — N3000 Acute cystitis without hematuria: Secondary | ICD-10-CM | POA: Diagnosis not present

## 2020-11-05 LAB — LIPID PANEL
Cholesterol: 142 (ref 0–200)
HDL: 39 (ref 35–70)
LDL Cholesterol: 76
Triglycerides: 154 (ref 40–160)

## 2020-11-06 ENCOUNTER — Other Ambulatory Visit (HOSPITAL_COMMUNITY): Payer: Self-pay | Admitting: Internal Medicine

## 2020-11-06 DIAGNOSIS — M9901 Segmental and somatic dysfunction of cervical region: Secondary | ICD-10-CM | POA: Diagnosis not present

## 2020-11-06 DIAGNOSIS — M9903 Segmental and somatic dysfunction of lumbar region: Secondary | ICD-10-CM | POA: Diagnosis not present

## 2020-11-06 DIAGNOSIS — S39012A Strain of muscle, fascia and tendon of lower back, initial encounter: Secondary | ICD-10-CM | POA: Diagnosis not present

## 2020-11-06 DIAGNOSIS — M9902 Segmental and somatic dysfunction of thoracic region: Secondary | ICD-10-CM | POA: Diagnosis not present

## 2020-11-06 DIAGNOSIS — M546 Pain in thoracic spine: Secondary | ICD-10-CM | POA: Diagnosis not present

## 2020-11-06 DIAGNOSIS — G8929 Other chronic pain: Secondary | ICD-10-CM

## 2020-11-10 ENCOUNTER — Ambulatory Visit (INDEPENDENT_AMBULATORY_CARE_PROVIDER_SITE_OTHER): Payer: Medicare Other | Admitting: Clinical

## 2020-11-10 ENCOUNTER — Other Ambulatory Visit: Payer: Self-pay

## 2020-11-10 DIAGNOSIS — F4310 Post-traumatic stress disorder, unspecified: Secondary | ICD-10-CM | POA: Diagnosis not present

## 2020-11-10 DIAGNOSIS — F330 Major depressive disorder, recurrent, mild: Secondary | ICD-10-CM

## 2020-11-10 NOTE — Progress Notes (Signed)
  Virtual Visit via Telephone Note  I connected withTamara [name] on 11/10/20 at  10:00 AM EST by telephoneand verified that I am speaking with the correct person using two identifiers.  Location: Patient: Home Provider: Office  I discussed the limitations, risks, security and privacy concerns of performing an evaluation and management service by telephone and the availability of in person appointments. I also discussed with the patient that there may be a patient responsible charge related to this service. The patient expressed understanding and agreed to proceed.  THERAPIST PROGRESS NOTE  Session Time:10:00AM-10:45AM  Participation Level:Active  Behavioral Response:CasualAlertDepressed  Type of Therapy:Individual Therapy  Treatment Goals addressed:Coping  Interventions:CBT, DBT, Solution Focused, Strength-based and Supportive  Summary:Laura Leppertis a 60 y.o.femalewho presents with PTSD.The OPT therapist worked with thepatientfor herongoing OPT treatmentsession. The OPT therapist utilized Motivational Interviewing to assist in creating therapeutic repore. The patient in the session was engaged and work in Science writer about hertriggers and symptoms over the past few weeksincludingcontracting COVID and ongoing back pain. The OPT therapist utilized Cognitive Behavioral Therapy through cognitive restructuring as well as worked with the patient on coping strategies to assist in management ofmental healthsymptoms as well asongoingwork tochallenge current negative automatic thoughts and improve the patients thought processes.The patient spoke about working and her ongoing work to manage her pain, as well as the impact of having COVID and the symptoms she is currently struggling with.The OPT therapist continued to give support around being active, social, and positiveandacknowledged the primarily focus currently is getting health back to  her baseline.  Suicidal/Homicidal:Nowithout intent/plan  Therapist Response:The OPT therapist worked with the patient for the patients scheduled session. The patient was engaged in hersession and gave feedback in relation to triggers, symptoms, and behavior responses over the pastfewweeks. The OPT therapist worked with the patient utilizing an in session Cognitive Behavioral Therapy exercise. The patient was responsive in the session and verbalized, "I am just struggling with COVID and overwhelmed because I am feeling bad and feeling very emotional".The OPT therapist validated the patients feelings with her current physical health struggle andcontinued in this session towork with the patient on challenging her negative automatic thoughts, continue to work on positive thinking, as well as self esteem and confidence .The OPT therapist supported activity within the patients limits.The OPT therapist will continue treatment work with the patient in hernext scheduled session  Plan: Return again in2/3weeks.  Diagnosis:Axis I:Post Traumatic Stress Disorder  Axis II:No diagnosis  I discussed the assessment and treatment plan with the patient. The patient was provided an opportunity to ask questions and all were answered. The patient agreed with the plan and demonstrated an understanding of the instructions.  The patient was advised to call back or seek an in-person evaluation if the symptoms worsen or if the condition fails to improve as anticipated.  I provided59minutes of non-face-to-face time during this encounter.  Lennox Grumbles, LCSW  11/10/2020

## 2020-11-12 ENCOUNTER — Other Ambulatory Visit: Payer: Self-pay

## 2020-11-12 ENCOUNTER — Ambulatory Visit (HOSPITAL_COMMUNITY)
Admission: RE | Admit: 2020-11-12 | Discharge: 2020-11-12 | Disposition: A | Discharge status: Home / Self Care | Payer: Medicare Other | Source: Ambulatory Visit | Attending: Student | Admitting: Student

## 2020-11-12 DIAGNOSIS — R0600 Dyspnea, unspecified: Secondary | ICD-10-CM | POA: Diagnosis not present

## 2020-11-12 DIAGNOSIS — R0609 Other forms of dyspnea: Secondary | ICD-10-CM

## 2020-11-12 LAB — ECHOCARDIOGRAM COMPLETE
Area-P 1/2: 3.99 cm2
S' Lateral: 2.8 cm

## 2020-11-12 NOTE — Progress Notes (Signed)
*  PRELIMINARY RESULTS* Echocardiogram 2D Echocardiogram has been performed.  Laura [name] 11/12/2020, 10:27 AM

## 2020-11-13 ENCOUNTER — Encounter: Payer: Self-pay | Admitting: Allergy & Immunology

## 2020-11-13 ENCOUNTER — Telehealth: Payer: Self-pay

## 2020-11-13 ENCOUNTER — Other Ambulatory Visit: Payer: Self-pay

## 2020-11-13 ENCOUNTER — Ambulatory Visit (INDEPENDENT_AMBULATORY_CARE_PROVIDER_SITE_OTHER): Payer: Medicare Other | Admitting: Allergy & Immunology

## 2020-11-13 VITALS — BP 126/70 | HR 89 | Temp 96.9°F | Resp 18 | Ht 63.39 in | Wt 207.0 lb

## 2020-11-13 DIAGNOSIS — J310 Chronic rhinitis: Secondary | ICD-10-CM

## 2020-11-13 DIAGNOSIS — I480 Paroxysmal atrial fibrillation: Secondary | ICD-10-CM | POA: Diagnosis not present

## 2020-11-13 DIAGNOSIS — J4550 Severe persistent asthma, uncomplicated: Secondary | ICD-10-CM | POA: Diagnosis not present

## 2020-11-13 DIAGNOSIS — D839 Common variable immunodeficiency, unspecified: Secondary | ICD-10-CM

## 2020-11-13 NOTE — Telephone Encounter (Signed)
Pt called to check on referral status from Dr. Juel Burrow office.

## 2020-11-13 NOTE — Progress Notes (Signed)
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 Hypogammaglobulinemia - Continue with Cuvitru at the same dose. - We will plan to get your Moderna vaccine in three months, with Cardiology approval.   2. Chronic rhinitis   - Continue with nasal saline rinses as needed.   3. Severe persistent asthma, uncomplicated - Lung function looks great today. - We are going to continue with the same medication regimen for now.  - Daily controller medication(s): Breztri two puffs twice daily with a spacer + Fasenra every 8 weeks - Prior to physical activity: ProAir 2 puffs 10-15 minutes before physical activity. - Rescue medications: ProAir 4 puffs every 4-6 hours as needed, albuterol nebulizer one vial every 4-6 hours as needed or DuoNeb nebulizer one vial every 4-6 hours as needed - Asthma control goals:  * Full participation in all desired activities (may need albuterol before activity) * Albuterol use two time or less a week on average (not counting use with activity) * Cough interfering with sleep two time or less a month * Oral steroids no more than once a year * No hospitalizations.  4. Fatigue - I think this is all from the chronic steroid use. - We are going to refer you to see Laura [name] to help with these symptoms. - They should call you in 1-2 weeks with the office   5. Return in about 3 months (around 02/10/2021).   Subjective:   Laura [name] is a 60 y.o. female presenting today for follow up of  Chief Complaint  Patient presents with  . Asthma  . Breathing Problem    Laura [name] has a history of the following: Patient Active Problem List   Diagnosis Date Noted  . Abnormal EKG   .  Chronic diastolic (congestive) heart failure ([place]) 08/29/2019  . Shortness of breath 08/29/2019  . Bipolar disorder ([place]) 08/10/2018  . Headache 08/10/2018  . H/O atrial flutter 08/10/2018  . OSA on CPAP 08/10/2018  . Anxiety 08/10/2018  . Uncontrolled type 2 diabetes mellitus with hyperglycemia ([place]) 03/13/2018  . Hypothyroidism 03/13/2018  . Essential hypertension, benign 03/13/2018  . Mixed hyperlipidemia 03/13/2018  . Hypogammaglobulinemia ([place]) 02/06/2018  . Non-allergic rhinitis 02/06/2018  . Severe persistent asthma, uncomplicated 24/06/7352  . Pulmonary nodules 02/06/2018  . Bronchiectasis without complication ([place]) 29/92/4268  . DM type 2 causing vascular disease ([place]) 02/06/2018    History obtained from: chart review and patient.  Laura [name] is a 60 y.o. female presenting for a follow up visit.  She was last seen in January 2022.  At that time, she was doing very well on her immunoglobulin replacement.  We did make plans for her to get her COVID-19 booster.  For her rhinitis, would continue with nasal saline rinses.  Lung function was not done.  We stopped her Symbicort and Spiriva and started Breztri 2 puffs twice daily as well as continuing with Fasenra every 8 weeks.  We also continue with albuterol as needed.  Shortly thereafter, she contacted [place] to tell us that she had a positive COVID-19 test.  Her pulse ox did vacillate a lot and she experienced a lot of shortness of breath.  She did have a few days of fevers.  We referred her several times for  a monoclonal antibody infusion, but evidently this was not approved.  She did slowly recover over the course of a couple weeks.  Since the last visit, she has mostly done well.  However, she reports that she has having a lot of fatigue since having COVID-19.  She has taken a multitude of different vitamins. She is taking her iron, B12, magnesium, KCl, and vitamin D3. She is also on Zinc as well as apple cider and vitamin C and a Women's  One a Day.   She reports feeling as if she does not want to eat.  She reports that she has lost a total of 24 pounds in the last month.  She said she is just not interested in eating. She just does not have an appetite. She will force herself to eat something, but then she will put it in the fridge. She reports that she is nauseous. She is using Zofrna every 8 hours. She is having normal bowel movements. All of this has worsened since having COVID19. She has been referred to Laura [name] and her appointment is pending. She stopped drinking coffee and is now drinking tea (she is drinking Gut Tea and elderberry tea).   She reports that she has lower abdominal pain.  Her primary care provider has told her that her uterus is inverted, which might be contributing to her abdominal pain.   Stating that, this all kind of started in November when she fell off a stepladder and her back.  She did not have any surgery performed, but she was put on a very prolonged prednisone taper over the course of several weeks.  She estimates that she remains on prednisone for around 2 half months total.  We then gave her prednisone when she was diagnosed with COVID-19 as well.  Asthma/Respiratory Symptom History: She remains on the Breztri two puffs twice daily with a spacer. She also has atrial fibrillation. Her controlled medication does not seem to hurt her atrial fibrillation.  ACT score is 12, indicating poor asthma control.  She has been using her rescue inhaler slightly more.  She is up-to-date on her Berna Bue.  She is somewhat frustrated with her PCP right now. She has seen Laura [name] only four times since she started going there. She did like Laura [name] but she now left. She seems to like the new front desk person. She also likes the new NP Laura [name], whom she apparently met during an ER visit at some point.  Otherwise, there have been no changes to her past medical history, surgical history, family history, or social  history.    Review of Systems  Constitutional: Negative.  Negative for chills, fever, malaise/fatigue and weight loss.  HENT: Positive for congestion and sinus pain. Negative for ear discharge and ear pain.   Eyes: Negative for pain, discharge and redness.  Respiratory: Negative for cough, sputum production, shortness of breath and wheezing.   Cardiovascular: Negative.  Negative for chest pain and palpitations.  Gastrointestinal: Negative for abdominal pain, constipation, diarrhea, heartburn, nausea and vomiting.  Skin: Negative.  Negative for itching and rash.  Neurological: Negative for dizziness and headaches.  Endo/Heme/Allergies: Negative for environmental allergies. Does not bruise/bleed easily.       Objective:   Blood pressure 126/70, pulse 89, temperature (!) 96.9 F (36.1 C), temperature source Temporal, resp. rate 18, height 5' 3.39" (1.61 m), weight 207 lb (93.9 kg), SpO2 97 %. Body mass index is 36.22 kg/m.   Physical Exam:  Physical Exam Constitutional:  Appearance: She is well-developed.     Comments: Fatigued appearing.  HENT:     Head: Normocephalic and atraumatic.     Right Ear: Tympanic membrane, ear canal and external ear normal.     Left Ear: Tympanic membrane, ear canal and external ear normal.     Nose: No nasal deformity, septal deviation, mucosal edema, rhinorrhea or epistaxis.     Right Turbinates: Enlarged and swollen.     Left Turbinates: Enlarged and swollen.     Right Sinus: No maxillary sinus tenderness or frontal sinus tenderness.     Left Sinus: No maxillary sinus tenderness or frontal sinus tenderness.     Mouth/Throat:     Mouth: Oropharynx is clear and moist. Mucous membranes are not pale and not dry.     Pharynx: Uvula midline.  Eyes:     General:        Right eye: No discharge.        Left eye: No discharge.     Extraocular Movements: EOM normal.     Conjunctiva/sclera: Conjunctivae normal.     Right eye: Right conjunctiva is  not injected. No chemosis.    Left eye: Left conjunctiva is not injected. No chemosis.    Pupils: Pupils are equal, round, and reactive to light.  Cardiovascular:     Rate and Rhythm: Normal rate and regular rhythm.     Heart sounds: Normal heart sounds.  Pulmonary:     Effort: Pulmonary effort is normal. No tachypnea, accessory muscle usage or respiratory distress.     Breath sounds: Normal breath sounds. No wheezing, rhonchi or rales.     Comments: Somewhat decreased air movement at the bases.  No eczematous or urticarial lesions noted. Chest:     Chest wall: No tenderness.  Lymphadenopathy:     Cervical: No cervical adenopathy.  Skin:    General: Skin is warm.     Capillary Refill: Capillary refill takes less than 2 seconds.     Coloration: Skin is not pale.     Findings: No abrasion, erythema, petechiae or rash. Rash is not papular, urticarial or vesicular.     Comments: No eczematous or urticarial lesions noted.  Neurological:     Mental Status: She is alert.  Psychiatric:        Mood and Affect: Mood and affect normal.      Diagnostic studies:   Spirometry: results abnormal (FEV1: 1.61/64%, FVC: 2.14/66%, FEV1/FVC: 75%).    Spirometry consistent with possible restrictive disease.   Allergy Studies: none       Salvatore Marvel, MD  [facility] [place] [facility] [place]

## 2020-11-13 NOTE — Patient Instructions (Addendum)
1. Hypogammaglobulinemia - Continue with Cuvitru at the same dose. - We will plan to get your Moderna vaccine in three months, with Cardiology approval.   2. Chronic rhinitis   - Continue with nasal saline rinses as needed.   3. Severe persistent asthma, uncomplicated - Lung function looks great today. - We are going to continue with the same medication regimen for now.  - Daily controller medication(s): Breztri two puffs twice daily with a spacer + Fasenra every 8 weeks - Prior to physical activity: ProAir 2 puffs 10-15 minutes before physical activity. - Rescue medications: ProAir 4 puffs every 4-6 hours as needed, albuterol nebulizer one vial every 4-6 hours as needed or DuoNeb nebulizer one vial every 4-6 hours as needed - Asthma control goals:  * Full participation in all desired activities (may need albuterol before activity) * Albuterol use two time or less a week on average (not counting use with activity) * Cough interfering with sleep two time or less a month * Oral steroids no more than once a year * No hospitalizations.  4. Fatigue - I think this is all from the chronic steroid use. - We are going to refer you to see [name] to help with these symptoms. - They should call you in 1-2 weeks with the office   5. Return in about 3 months (around 02/10/2021).    Please inform us of any Emergency Department visits, hospitalizations, or changes in symptoms. Call us before going to the ED for breathing or allergy symptoms since we might be able to fit you in for a sick visit. Feel free to contact us anytime with any questions, problems, or concerns.  It was a pleasure to see you again today!  Websites that have reliable patient information: 1. American Academy of Asthma, Allergy, and Immunology: www.aaaai.org 2. Food Allergy Research and Education (FARE): foodallergy.org 3. Mothers of Asthmatics: http://www.asthmacommunitynetwork.org 4. American College of Allergy, Asthma, and  Immunology: www.acaai.org   COVID-19 Vaccine Information can be found at: [email or web address].[email or web address].uk For questions related to vaccine distribution or appointments, please email [email or web address] or call [phone].     "Like" [place] on Facebook and Instagram for our latest updates!       Make sure you are registered to vote! If you have moved or changed any of your contact information, you will need to get this updated before voting!  In some cases, you MAY be able to register to vote online: [email or web address]

## 2020-11-16 NOTE — Telephone Encounter (Signed)
Referral is in proficient waiting to be approved to take her on as a new patient.

## 2020-11-17 ENCOUNTER — Ambulatory Visit (INDEPENDENT_AMBULATORY_CARE_PROVIDER_SITE_OTHER): Payer: Medicare Other | Admitting: Podiatry

## 2020-11-17 ENCOUNTER — Encounter: Payer: Self-pay | Admitting: Podiatry

## 2020-11-17 ENCOUNTER — Encounter: Payer: Self-pay | Admitting: Allergy & Immunology

## 2020-11-17 ENCOUNTER — Other Ambulatory Visit: Payer: Self-pay | Admitting: Physician Assistant

## 2020-11-17 ENCOUNTER — Other Ambulatory Visit: Payer: Self-pay

## 2020-11-17 DIAGNOSIS — G629 Polyneuropathy, unspecified: Secondary | ICD-10-CM

## 2020-11-17 DIAGNOSIS — M2041 Other hammer toe(s) (acquired), right foot: Secondary | ICD-10-CM

## 2020-11-17 DIAGNOSIS — M722 Plantar fascial fibromatosis: Secondary | ICD-10-CM

## 2020-11-17 DIAGNOSIS — M47814 Spondylosis without myelopathy or radiculopathy, thoracic region: Secondary | ICD-10-CM | POA: Diagnosis not present

## 2020-11-17 DIAGNOSIS — M5010 Cervical disc disorder with radiculopathy, unspecified cervical region: Secondary | ICD-10-CM

## 2020-11-17 DIAGNOSIS — M778 Other enthesopathies, not elsewhere classified: Secondary | ICD-10-CM

## 2020-11-17 DIAGNOSIS — M2042 Other hammer toe(s) (acquired), left foot: Secondary | ICD-10-CM

## 2020-11-17 DIAGNOSIS — M545 Low back pain, unspecified: Secondary | ICD-10-CM

## 2020-11-17 DIAGNOSIS — M5416 Radiculopathy, lumbar region: Secondary | ICD-10-CM | POA: Diagnosis not present

## 2020-11-17 DIAGNOSIS — M47812 Spondylosis without myelopathy or radiculopathy, cervical region: Secondary | ICD-10-CM | POA: Diagnosis not present

## 2020-11-18 ENCOUNTER — Encounter: Payer: Self-pay | Admitting: Internal Medicine

## 2020-11-18 ENCOUNTER — Telehealth: Payer: Self-pay | Admitting: Allergy & Immunology

## 2020-11-18 NOTE — Progress Notes (Signed)
She presents today for follow-up of her left heel pain he states is not doing a lot better she states that she fell the first week of November and injured her back she saw Dr. Noemi Chapel had x-rays done and is scheduled for an MRI.  She is on prednisone which increased her blood sugar to a 7.7 hemoglobin A1c she is very disappointed about she states that she is lost 26 pounds and has to see a urologist due to incontinence and she has recently gone back into A. fib.  Objective: Vital signs are stable she is alert and oriented x3.  Pulses are palpable.  She still has pain on end range of motion of the second metatarsophalangeal joint of the right foot with significant plantar fasciitis to the left heel.  Pulses remain palpable no changes in findings from previous evaluation.  Assessment: Diabetes with diabetic peripheral neuropathy digital deformities plantar fasciitis capsulitis and hammertoe deformity.  Plan: I spoke with Liliane Channel today and he is going to fabricate a set of diabetic orthotics with a cut out beneath the second metatarsophalangeal joints bilaterally i and a small metatarsal pad.  She will follow-up with him as soon as possible.

## 2020-11-18 NOTE — Telephone Encounter (Signed)
Referral placed to [facility]. I will call and inform patient.

## 2020-11-18 NOTE — Progress Notes (Signed)
Referral placed to [facility] Endocrin[facility].

## 2020-11-19 ENCOUNTER — Telehealth: Payer: Self-pay | Admitting: Neurology

## 2020-11-19 ENCOUNTER — Other Ambulatory Visit: Payer: Self-pay

## 2020-11-19 ENCOUNTER — Ambulatory Visit (HOSPITAL_COMMUNITY)
Admission: RE | Admit: 2020-11-19 | Discharge: 2020-11-19 | Disposition: A | Discharge status: Home / Self Care | Payer: Medicare Other | Source: Ambulatory Visit | Attending: Internal Medicine | Admitting: Internal Medicine

## 2020-11-19 DIAGNOSIS — M5127 Other intervertebral disc displacement, lumbosacral region: Secondary | ICD-10-CM | POA: Diagnosis not present

## 2020-11-19 DIAGNOSIS — G8929 Other chronic pain: Secondary | ICD-10-CM

## 2020-11-19 DIAGNOSIS — M48061 Spinal stenosis, lumbar region without neurogenic claudication: Secondary | ICD-10-CM | POA: Diagnosis not present

## 2020-11-19 DIAGNOSIS — M549 Dorsalgia, unspecified: Secondary | ICD-10-CM | POA: Insufficient documentation

## 2020-11-19 DIAGNOSIS — M47814 Spondylosis without myelopathy or radiculopathy, thoracic region: Secondary | ICD-10-CM | POA: Diagnosis not present

## 2020-11-19 DIAGNOSIS — M4802 Spinal stenosis, cervical region: Secondary | ICD-10-CM | POA: Diagnosis not present

## 2020-11-19 DIAGNOSIS — M47816 Spondylosis without myelopathy or radiculopathy, lumbar region: Secondary | ICD-10-CM | POA: Diagnosis not present

## 2020-11-19 IMAGING — MR MR CERVICAL SPINE WO/W CM
13 of 25 series · 23 of 48 positions shown · IV contrast (gadavist)
Comparison: None.

CLINICAL DATA: Pain involving the entire spine after a fall.
Spasms. Tremors.

EXAM:
MRI CERVICAL, THORACIC AND LUMBAR SPINE WITHOUT AND WITH CONTRAST
TECHNIQUE: Multiplanar and multiecho pulse sequences of the cervical spine, to
include the craniocervical junction and cervicothoracic junction,
and thoracic and lumbar spine, were obtained without and with
intravenous contrast.
CONTRAST:  10mL GADAVIST GADOBUTROL 1 MMOL/ML IV SOLN

[Series 18: STIR · sagittal · 3.0mm · 0.86mm/px · 1 of 15 slices shown]
[im 1/15]
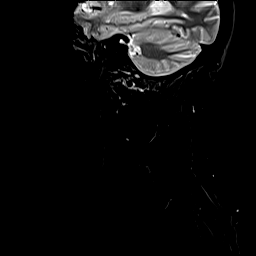

[Series 21: T1 · axial · non-contrast · 3.0mm · 0.35mm/px · z∈[-48,+42]mm · 3 of 30 slices shown (1 of 6)]
[im 1/30]
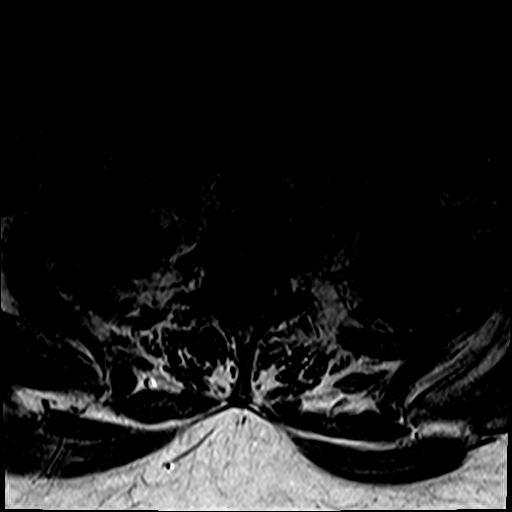
[im 15/30]
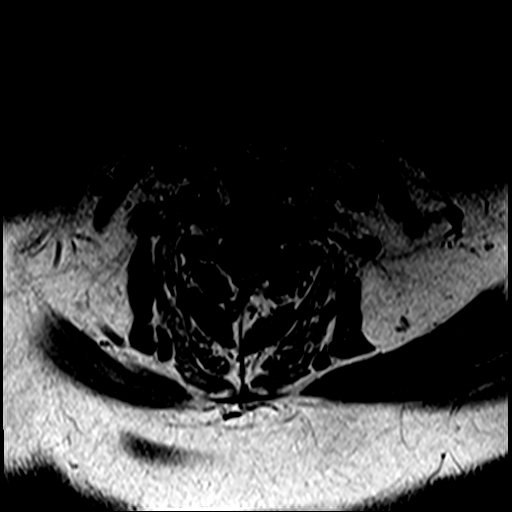
[im 30/30]
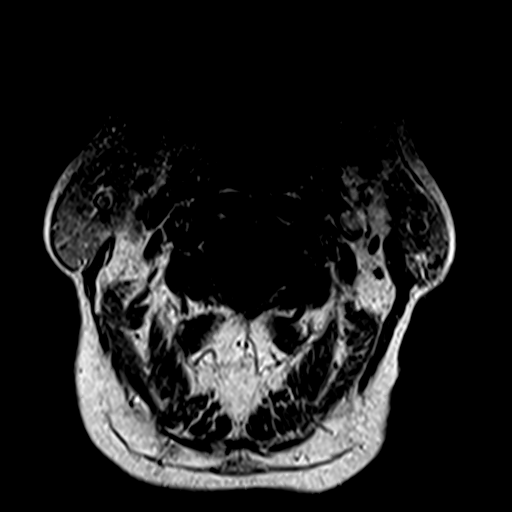

[Series 22: T1 · sagittal · 3.0mm · 0.62mm/px · 1 of 12 slices shown (2 of 6)]
[im 1/12]
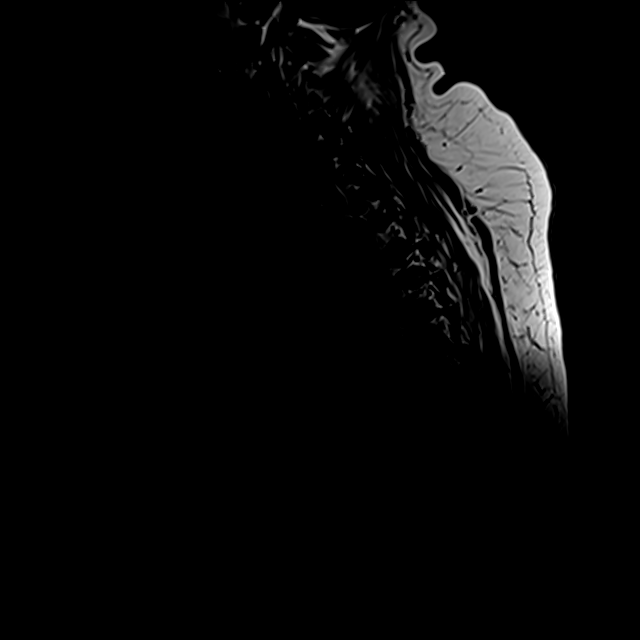

[Series 23: T2 · sagittal · 3.0mm · 1.06mm/px · 1 of 13 slices shown (1 of 4)]
[im 1/13]
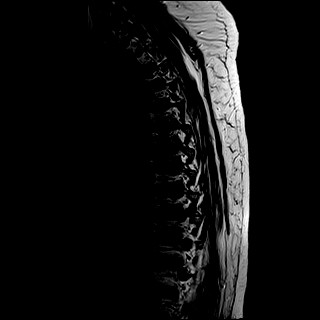

[Series 24: T1 · sagittal · 3.0mm · 1.33mm/px · 1 of 13 slices shown (3 of 6)]
[im 1/13]
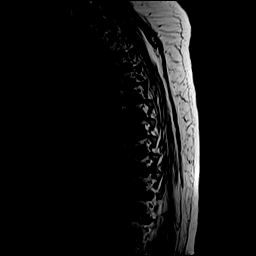

[Series 26: T2 · axial · 5.0mm · 0.74mm/px · z∈[-283,-63]mm · 3 of 39 slices shown (2 of 4)]
[im 1/39]
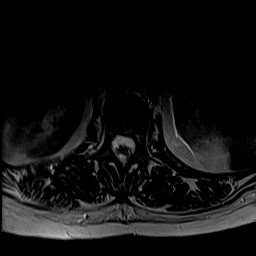
[im 20/39]
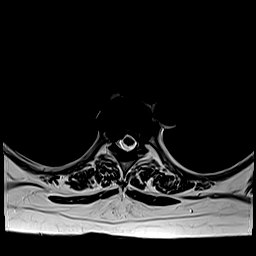
[im 39/39]
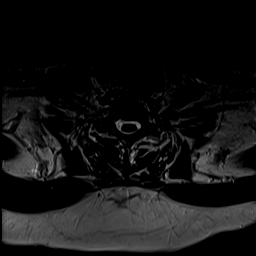

[Series 28: T1 · axial · non-contrast · 5.0mm · 0.37mm/px · z∈[-283,-63]mm · 3 of 39 slices shown (4 of 6)]
[im 1/39]
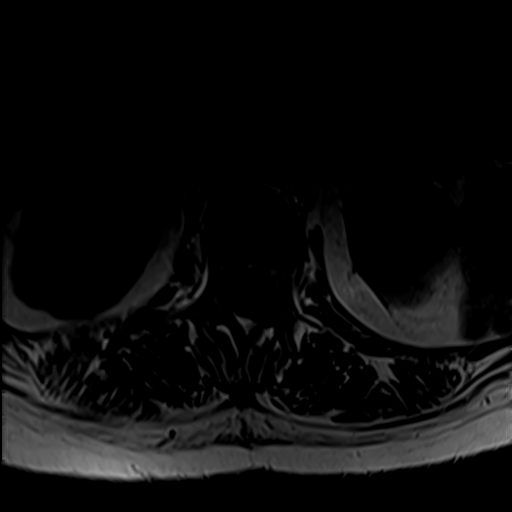
[im 20/39]
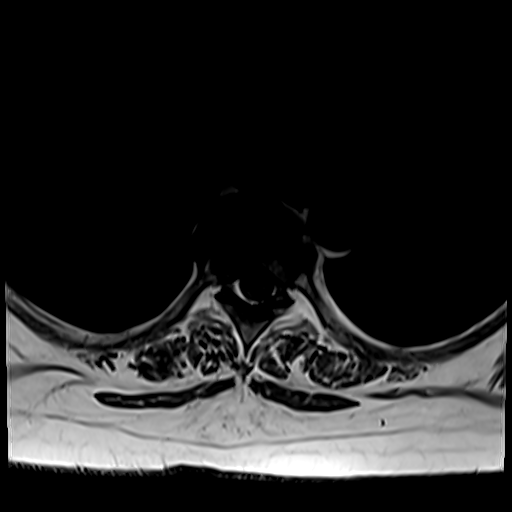
[im 39/39]
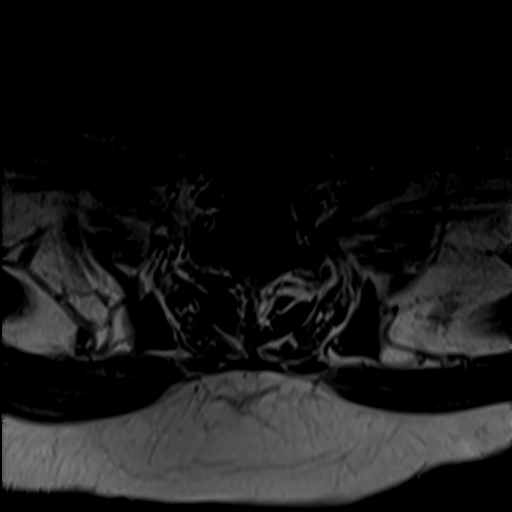

[Series 29: T2 · sagittal · 4.0mm · 0.81mm/px · 1 of 16 slices shown (3 of 4)]
[im 1/16]
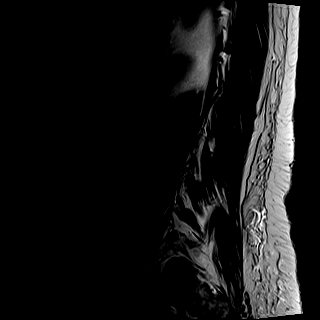

[Series 31: T1 · sagittal · 4.0mm · 1.02mm/px · 1 of 16 slices shown (5 of 6)]
[im 1/16]
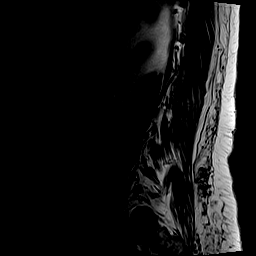

[Series 32: T2 · axial · 4.0mm · 0.70mm/px · z∈[-473,-289]mm · 3 of 31 slices shown (4 of 4)]
[im 1/31]
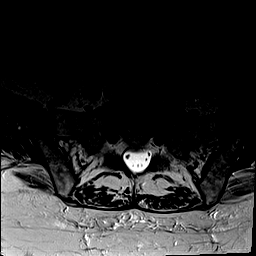
[im 16/31]
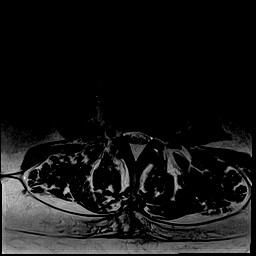
[im 31/31]
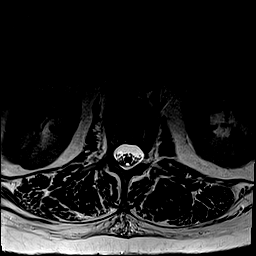

[Series 33: T1 · axial · 4.0mm · 0.35mm/px · z∈[-473,-289]mm · 3 of 31 slices shown (6 of 6)]
[im 1/31]
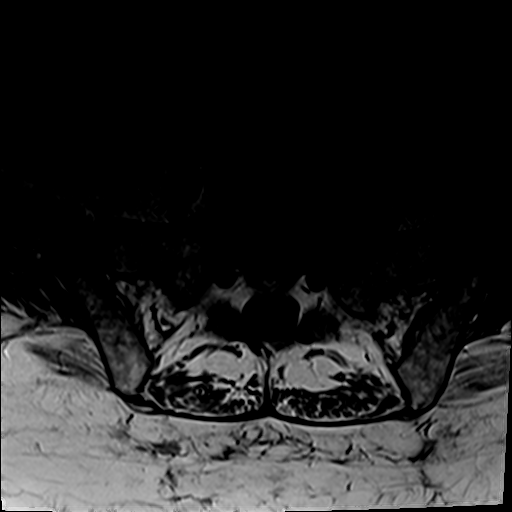
[im 16/31]
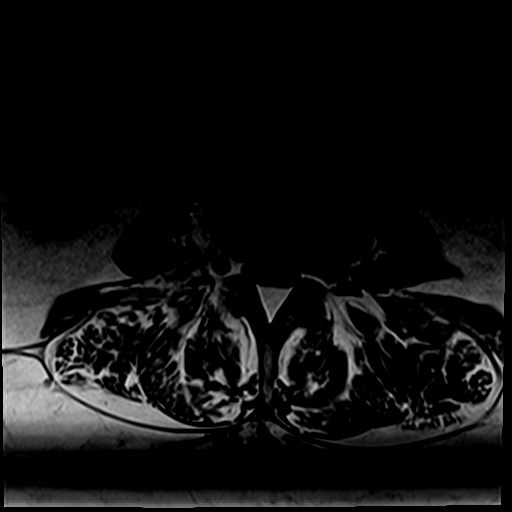
[im 31/31]
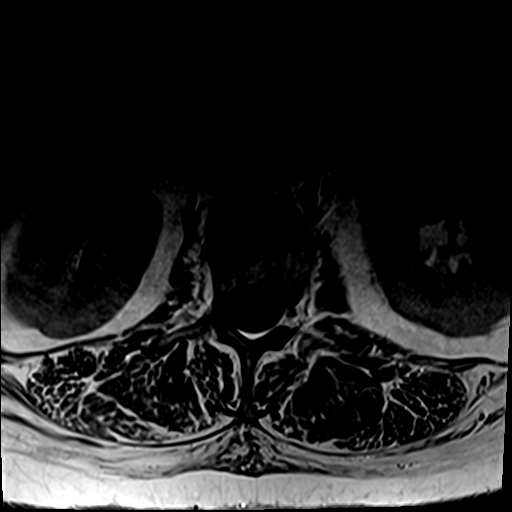

[Series 34: T1 fat-sat post-contrast · sagittal · 4.0mm · 1.02mm/px · 1 of 16 slices shown (1 of 2)]
[im 1/16]
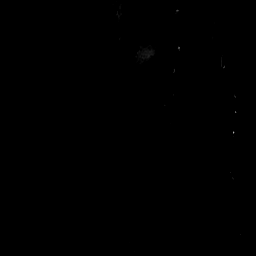

[Series 36: T1 fat-sat post-contrast · sagittal · 3.0mm · 1.06mm/px · 1 of 17 slices shown (2 of 2)]
[im 1/17]
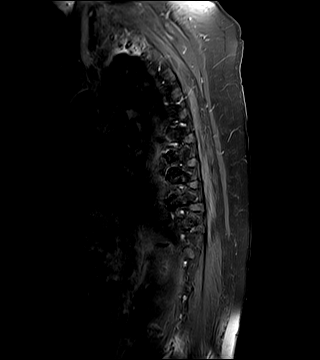

[23 of 48 positions shown; findings below may reference images not displayed]

FINDINGS: Patient claustrophobic and therefore faster scans utilized, limiting
evaluation.

MRI CERVICAL SPINE FINDINGS

Alignment: No substantial sagittal subluxation.

Vertebrae: Vertebral body heights are maintained. No specific
evidence of acute fracture, discitis/osteomyelitis, or suspicious
bone lesion. No abnormal enhancement.

Cord: No abnormal cord signal.  No abnormal enhancement identified.

Posterior Fossa, vertebral arteries, paraspinal tissues: Vertebral
artery flow voids are maintained. Unremarkable posterior fossa.
Partially empty and expanded sella.

Disc levels:

C2-C3: Right greater than left facet and uncovertebral hypertrophy
with mild right foraminal stenosis. No significant canal stenosis.

C3-C4: Posterior disc osteophyte complex and bilateral facet and
uncovertebral hypertrophy. Moderate canal stenosis. Mild bilateral
foraminal stenosis.

C4-C5: Posterior disc osteophyte complex and bilateral facet and
uncovertebral hypertrophy. Resulting moderate to severe canal
stenosis and moderate bilateral foraminal stenosis.

C5-C6: Posterior disc osteophyte complex and bilateral facet and
uncovertebral hypertrophy with moderate canal stenosis. Moderate
bilateral foraminal stenosis.

C6-C7: Posterior disc osteophyte complex and mild uncovertebral
hypertrophy. Mild canal stenosis without significant foraminal
stenosis.

C7-T1: Mild facet hypertrophy without significant canal or foraminal
stenosis.

MRI THORACIC SPINE FINDINGS

Alignment: No substantial sagittal subluxation. Mild broad
dextrocurvature.

Vertebrae: Vertebral body heights are maintained. Mild heterogeneity
of the bone marrow without specific evidence of acute fracture,
discitis/osteomyelitis, or suspicious bone lesion. Scattered small
degenerative endplate Schmorl's nodes. Mild right eccentric edema
and enhancement involving the anterior/superior L1 endplates is
favored degenerative.

Cord: Normal cord signal.  No abnormal enhancement identified.

Paraspinal and other soft tissues: Unremarkable.

Disc levels:

Small disc bulges at T1-T2, T6-T7, T7-T8, T9-T10, T10-T11, which
partially efface ventral CSF without significant canal stenosis. The
T1-T2 disc bulge mildly deforms the ventral cord. Mild multilevel
facet hypertrophy without significant foraminal stenosis.

MRI LUMBAR SPINE FINDINGS

Segmentation:  Standard.

Alignment: Minimal stepwise degenerative retrolisthesis of L2 on L3
and L3 on L4. Mild broad levocurvature.

Vertebrae: Vertebral body heights are maintained. No specific
evidence of acute fracture, discitis/osteomyelitis, or suspicious
bone lesion. Mild right eccentric edema and enhancement involving
the anterior/superior L1 endplates is favored degenerative. Mild
heterogeneity of the bone marrow.

Conus medullaris and cauda equina: Conus extends to the L1-L2 level.
Conus appears normal. No abnormal enhancement identified.

Paraspinal and other soft tissues: Right extrarenal pelvis. Tiny
right renal cyst.

Disc levels:

T12-L1: Mild disc bulging and mild facet hypertrophy without
significant canal or foraminal stenosis.

L1-L2: Mild disc bulging and mild facet hypertrophy without
significant canal or foraminal stenosis.

L2-L3: Slight retrolisthesis of L2 on L3. Bilateral foraminal disc
bulges and mild facet hypertrophy. Prominent dorsal epidural fat.
Resulting mild canal and bilateral subarticular recess stenosis
without significant foraminal stenosis.

L3-L4: Small broad-based disc bulge, mild bilateral facet
hypertrophy, and prominent dorsal epidural fat. Resulting moderate
canal and bilateral subarticular recess stenosis without significant
foraminal stenosis. Left far lateral disc bulge contacts the exited
left L3 nerve.

L4-L5: Broad-based disc bulge and moderate bilateral facet
hypertrophy with ligamentum flavum thickening and prominent dorsal
epidural fat. Resulting moderate canal and right greater than left
subarticular recess stenosis. Mild left foraminal stenosis.

L5-S1: Broad-based disc bulge with small central disc protrusion.
Moderate bilateral facet hypertrophy. Mild left foraminal stenosis.
IMPRESSION: Motion limited study without specific evidence of acute abnormality.
CT could provide better evaluation of bony detail if clinically
indicated.

MRI of the cervical spine:

1. Moderate to severe canal stenosis at C4-C5. Moderate canal
stenosis at C3-C4 and C5-C6. Mild canal stenosis at C6-C7.
2. Moderate foraminal stenosis bilaterally at C4-C5 and C5-C6. Mild
bilateral foraminal stenosis at C3-C4.
3. Partially imaged empty and expanded sella, which is nonspecific
but can be seen with idiopathic intracranial hypertension in the
correct clinical setting.

MRI of the thoracic spine:

1. Mild multilevel degenerative change with multiple small disc
bulges. No significant canal or foraminal stenosis in the thoracic
spine.

MRI of the lumbar spine:

1. Combination of degenerative change and prominent dorsal epidural
fat results in moderate canal and bilateral subarticular recess
stenosis at L3-L4 and L4-L5.
2. Mild foraminal stenosis on the left at L4-L5 and L5-S1. Far
lateral left disc contacts the exited left L3 nerve at L3-L4.

## 2020-11-19 MED ORDER — GADOBUTROL 1 MMOL/ML IV SOLN
10.0000 mL | Freq: Once | INTRAVENOUS | Status: AC | PRN
  Administered 2020-11-19: 10 mL via INTRAVENOUS

## 2020-11-19 NOTE — Telephone Encounter (Signed)
Called patient to offer a VV with Dr Carles Collet 11/20/20. Was not able to speak to patient or leave a message as her mailbox is full. Will include other front desk to make aware in case patient returns call.

## 2020-11-19 NOTE — Telephone Encounter (Signed)
Patient states she is on medication for her tremors (she can't remember the name of it), but she feels like it isn't working for her. She didn't know if Dr Tat could increase the dosage for her. She states she has been on it for a couple of months now and has not seen any change in her tremors. Please call.

## 2020-11-19 NOTE — Telephone Encounter (Signed)
You can put her on Friday on VV to discuss increasing but I talked to her about limited options before because of her other meds (eliquis) and b/c of depakote causing tremor.

## 2020-11-20 DIAGNOSIS — I509 Heart failure, unspecified: Secondary | ICD-10-CM | POA: Diagnosis not present

## 2020-11-20 DIAGNOSIS — M25562 Pain in left knee: Secondary | ICD-10-CM | POA: Diagnosis not present

## 2020-11-20 DIAGNOSIS — Z20822 Contact with and (suspected) exposure to covid-19: Secondary | ICD-10-CM | POA: Diagnosis not present

## 2020-11-20 DIAGNOSIS — M25512 Pain in left shoulder: Secondary | ICD-10-CM | POA: Diagnosis not present

## 2020-11-20 DIAGNOSIS — M545 Low back pain, unspecified: Secondary | ICD-10-CM | POA: Diagnosis not present

## 2020-11-20 DIAGNOSIS — F319 Bipolar disorder, unspecified: Secondary | ICD-10-CM | POA: Diagnosis not present

## 2020-11-20 DIAGNOSIS — D803 Selective deficiency of immunoglobulin G [IgG] subclasses: Secondary | ICD-10-CM | POA: Diagnosis not present

## 2020-11-20 DIAGNOSIS — R059 Cough, unspecified: Secondary | ICD-10-CM | POA: Diagnosis not present

## 2020-11-20 DIAGNOSIS — R197 Diarrhea, unspecified: Secondary | ICD-10-CM | POA: Diagnosis not present

## 2020-11-20 DIAGNOSIS — J45909 Unspecified asthma, uncomplicated: Secondary | ICD-10-CM | POA: Diagnosis not present

## 2020-11-20 DIAGNOSIS — R252 Cramp and spasm: Secondary | ICD-10-CM | POA: Diagnosis not present

## 2020-11-20 DIAGNOSIS — G9009 Other idiopathic peripheral autonomic neuropathy: Secondary | ICD-10-CM | POA: Diagnosis not present

## 2020-11-23 ENCOUNTER — Telehealth: Payer: Self-pay

## 2020-11-23 NOTE — Telephone Encounter (Signed)
Patient scheduled 12/04/20 at 8:15am for a virtual visit. She is aware now that her mail box is full.

## 2020-11-23 NOTE — Telephone Encounter (Signed)
Pt is calling to see if our office has had any cancellations. Pt has a new cyst on her kidneys. Pt is on the cancellation list.

## 2020-11-23 NOTE — Telephone Encounter (Signed)
noted 

## 2020-11-24 ENCOUNTER — Other Ambulatory Visit: Payer: Self-pay

## 2020-11-24 ENCOUNTER — Ambulatory Visit (INDEPENDENT_AMBULATORY_CARE_PROVIDER_SITE_OTHER): Payer: Medicare Other | Admitting: Clinical

## 2020-11-24 DIAGNOSIS — F4310 Post-traumatic stress disorder, unspecified: Secondary | ICD-10-CM

## 2020-11-24 DIAGNOSIS — F330 Major depressive disorder, recurrent, mild: Secondary | ICD-10-CM | POA: Diagnosis not present

## 2020-11-24 NOTE — Progress Notes (Signed)
Virtual Visit via Video Note  I connected with Laura [name] on 11/24/20 at 11:00 AM EST by a video enabled telemedicine application and verified that I am speaking with the correct person using two identifiers.  Location: Patient: Home Provider: Office   I discussed the limitations of evaluation and management by telemedicine and the availability of in person appointments. The patient expressed understanding and agreed to proceed.    THERAPIST PROGRESS NOTE  Session Time:11:00AM-11:55AM  Participation Level:Active  Behavioral Response:CasualAlertDepressed  Type of Therapy:Individual Therapy  Treatment Goals addressed:Coping  Interventions:CBT, DBT, Solution Focused, Strength-based and Supportive  Summary:Shaleah Leppertis a 60 y.o.femalewho presents with PTSD.The OPT therapist worked with thepatientfor herongoing OPT treatmentsession. The OPT therapist utilized Motivational Interviewing to assist in creating therapeutic repore. The patient in the session was engaged and work in Science writer about hertriggers and symptoms over the past few weeksincludingincreased ongoing back pain which she was seen for from her PCP and learned she will need ongoing treatment due to increased physical health complications.The OPT therapist utilized Cognitive Behavioral Therapy through cognitive restructuring as well as worked with the patient on coping strategies to assist in management ofmental healthsymptoms as well asongoingwork tochallenge current negative automatic thoughts and improve the patients thought processes.The patientspoke about potentially being taken out of work due to restrictions and her ongoing  pain.The OPT therapist continued to give support around continuing to follow her health treatment plans.  Suicidal/Homicidal:Nowithout intent/plan  Therapist Response:The OPT therapist worked with the patient for the patients scheduled  session. The patient was engaged in hersession and gave feedback in relation to triggers, symptoms, and behavior responses over the pastfewweeks. The OPT therapist worked with the patient utilizing an in session Cognitive Behavioral Therapy exercise. The patient was responsive in the session and verbalized, "I am only able to take tylonol currently with my back pain and I also have to see a urologistl".The OPT therapist validated the patients feelings with her current physical health struggle andcontinued in this session towork with the patient on challenging her negative automatic thoughts, continue to work on positive thinking, as well as self esteem and confidence .The OPT therapist will continue treatment work with the patient in hernext scheduled session  Plan: Return again in2/3weeks.  Diagnosis:Axis I:Post Traumatic Stress Disorder  Axis II:No diagnosis  I discussed the assessment and treatment plan with the patient. The patient was provided an opportunity to ask questions and all were answered. The patient agreed with the plan and demonstrated an understanding of the instructions.  The patient was advised to call back or seek an in-person evaluation if the symptoms worsen or if the condition fails to improve as anticipated.  I provided5minutes of non-face-to-face time during this encounter.  Lennox Grumbles, LCSW  11/24/2020

## 2020-11-25 ENCOUNTER — Other Ambulatory Visit: Payer: Self-pay

## 2020-11-25 ENCOUNTER — Encounter: Payer: Self-pay | Admitting: "Endocrinology

## 2020-11-25 ENCOUNTER — Ambulatory Visit (INDEPENDENT_AMBULATORY_CARE_PROVIDER_SITE_OTHER): Payer: Medicare Other | Admitting: "Endocrinology

## 2020-11-25 VITALS — BP 142/58 | HR 84 | Ht 63.0 in | Wt 207.6 lb

## 2020-11-25 DIAGNOSIS — R5383 Other fatigue: Secondary | ICD-10-CM

## 2020-11-25 MED ORDER — VITAMIN D3 125 MCG (5000 UT) PO CAPS: 5000.0000 [IU] | ORAL_CAPSULE | Freq: Every day | ORAL | 0 refills | Status: AC

## 2020-11-25 NOTE — Patient Instructions (Signed)

## 2020-11-25 NOTE — Progress Notes (Signed)
11/25/2020, 3:39 PM Rereferred due to recent concern of fatigue related to recent exposure to steroids from [facility] 2021-January 2022.  Subjective:    Patient ID: Laura [name], female    DOB: [birth year].  Irys Nigh was seen in 2019 for management of diabetes.   PMD: [name], MD.   Past Medical History:  Diagnosis Date  . Asthma   . Atrial fibrillation ([place])   . Bipolar disorder ([place])   . Bronchiectasis ([place])   . CHF (congestive heart failure) ([place])   . Complication of anesthesia    "my Dr in [place] told me I had an allergic reaction to the combination of the pain meds and the anesthesia" she does not remember what happened but "I eneded up in the ICU for 5 days".  . H/O congenital atrial septal defect (ASD) repair   . Hypogammaglobulinemia ([place])   . Hypothyroid   . 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 Neuropathy   . Osteoarthritis   . PTSD (post-traumatic stress disorder)   . Recurrent sinusitis   . Short-term memory loss   . Sleep apnea   . TIA (transient ischemic attack)   . Tremors of nervous system    seeing PCP for this.  . Type 2 diabetes mellitus ([place])   . Urticaria   . Wound infection    Past Surgical History:  Procedure Laterality Date  . 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 CARDIAC SURGERY    . CARDIOVERSION    . CARDIOVERSION N/A 08/29/2018   Procedure: CARDIOVERSION;  Surgeon: Arnoldo Lenis, MD;  Location[facility];  Service: Endoscopy;  Laterality: N/A;  . [place]  . LEFT HEART CATH AND CORONARY ANGIOGRAPHY N/A 08/30/2019   Procedure: LEFT HEART CATH AND CORONARY ANGIOGRAPHY;  Surgeon: [name], MD;  Location: [facility];  Service: Cardiovascular;  Laterality: N/A;  . NASAL SINUS SURGERY  2017   Social History   Socioeconomic History  . Marital status: Widowed    Spouse name: Not on file  . Number of children: Not on  file  . Years of education: Not on file  . Highest education level: Not on file  Occupational History  . Not on file  Tobacco Use  . Smoking status: Former Smoker    Packs/day: 1.00    Years: 28.00    Pack years: 28.00    Types: Cigarettes    Start date: 56    Quit date: 2008    Years since quitting: 14.1  . Smokeless tobacco: Never Used  Vaping Use  . Vaping Use: Never used  Substance and Sexual Activity  . Alcohol use: Not Currently  . Drug use: Not Currently  . Sexual activity: Yes    Birth control/protection: None  Other Topics Concern  . Not on file  Social History Narrative  . Not on file   Social Determinants of Health   Financial Resource Strain: Not on file  Food Insecurity: Not on file  Transportation Needs: Not on file  Physical Activity: Not on file  Stress: Not on file  Social Connections: Not on file  Outpatient Encounter Medications as of 11/25/2020  Medication Sig  . Cholecalciferol (VITAMIN D3) 125 MCG (5000 UT) CAPS Take 1 capsule (5,000 Units total) by mouth daily.  Marland Kitchen acetaminophen (TYLENOL) 325 MG tablet Take 2 tablets (650 mg total) by mouth every 6 (six) hours as needed for mild pain, fever or headache.  . albuterol (PROVENTIL) (2.5 MG/3ML) 0.083% nebulizer solution Take 3 mLs (2.5 mg total) by nebulization every 4 (four) hours as needed for wheezing or shortness of breath.  Marland Kitchen albuterol (VENTOLIN HFA) 108 (90 Base) MCG/ACT inhaler INHALE 2 PUFFS INTO THE LUNGS EVERY 6 HOURS AS NEEDED FOR WHEEZING OR SHORTNESS OF BREATH  . apixaban (ELIQUIS) 5 MG TABS tablet Take 5 mg by mouth 2 (two) times daily.  Marland Kitchen atorvastatin (LIPITOR) 10 MG tablet Take 10 mg by mouth every evening.   . BD PEN NEEDLE NANO 2ND GEN 32G X 4 MM MISC SMARTSIG:Injection 4 Times Daily  . Benralizumab (FASENRA PEN) 30 MG/ML SOAJ Inject 30 mg into the skin every 8 (eight) weeks.  Marland Kitchen diltiazem (CARDIZEM CD) 360 MG 24 hr capsule Take 1 capsule (360 mg total) by mouth daily.  . divalproex  (DEPAKOTE ER) 500 MG 24 hr tablet 500 mg in AM, 1000 mg at night  . Dulaglutide 1.5 MG/0.5ML SOPN Inject 1.5 mg into the skin every Sunday.   Marland Kitchen EPINEPHrine 0.3 mg/0.3 mL IJ SOAJ injection   . gabapentin (NEURONTIN) 600 MG tablet Take 300 mg by mouth 3 (three) times daily.   Marland Kitchen HUMALOG KWIKPEN 100 UNIT/ML KwikPen Inject 5-11 Units into the skin 3 (three) times daily before meals. Sliding scale.  . hydrALAZINE (APRESOLINE) 25 MG tablet Take 25 mg by mouth 3 (three) times daily.  . hydrOXYzine (ATARAX/VISTARIL) 25 MG tablet Take 1 tablet (25 mg total) by mouth every 6 (six) hours as needed for anxiety or nausea.  . Immune Globulin, Human, 4 GM/20ML SOLN Inject 100 mLs into the skin every 14 (fourteen) days.  Marland Kitchen LANTUS SOLOSTAR 100 UNIT/ML Solostar Pen Inject 45 Units into the skin.   Marland Kitchen levothyroxine (SYNTHROID, LEVOTHROID) 112 MCG tablet Take 112 mcg by mouth daily before breakfast.  . Magnesium Oxide 400 MG CAPS Take 1 capsule (400 mg total) by mouth daily.  . Melatonin 3 MG TABS Take 3 mg by mouth at bedtime.  . metFORMIN (GLUCOPHAGE) 500 MG tablet Take 1,000 mg by mouth 2 (two) times daily.  . Nebulizer MISC Nebulizer tubing kit  . omeprazole (PRILOSEC) 20 MG capsule TAKE 1 CAPSULE(20 MG) BY MOUTH DAILY  . ondansetron (ZOFRAN) 4 MG tablet Take 1 tablet (4 mg total) by mouth every 8 (eight) hours as needed for nausea or vomiting.  Glory Rosebush ULTRA test strip USE TO CHECK BLOOD SUGAR FOUR TIMES DAILY  . potassium chloride SA (KLOR-CON) 20 MEQ tablet Take 20 mEq by mouth daily.  . Tiotropium Bromide Monohydrate (SPIRIVA RESPIMAT) 1.25 MCG/ACT AERS Inhale 2 puffs into the lungs daily.  Marland Kitchen topiramate (TOPAMAX) 100 MG tablet Take 1 tablet (100 mg total) by mouth daily.  Marland Kitchen torsemide (DEMADEX) 20 MG tablet Take 2 tablets (40 mg total) by mouth daily.   Facility-Administered Encounter Medications as of 11/25/2020  Medication  . Benralizumab SOSY 30 mg    ALLERGIES: Allergies  Allergen Reactions  .  Ativan [Lorazepam] Hives  . Phenergan [Promethazine Hcl] Hives    VACCINATION STATUS: Immunization History  Administered Date(s) Administered  . Influenza Split 08/24/2018  . Influenza Whole 07/15/2019  . Influenza,inj,Quad PF,6+ Mos  07/10/2017  . Moderna Sars-Covid-2 Vaccination 12/31/2019, 01/30/2020  . Pneumococcal-Unspecified 10/10/2012    Diabetes She presents for her follow-up diabetic visit. She has type 2 diabetes mellitus. Onset time: She was diagnosed at approximate age of 34 years, stated by gestational diabetes with all 3 of her pregnancies in her 38s. Her disease course has been worsening (After improving her diabetes profile prior to November/December 2021, she has observed increasing glycemic profile related to her steroid exposure.). There are no hypoglycemic associated symptoms. Pertinent negatives for hypoglycemia include no confusion, headaches, pallor or seizures. Associated symptoms include fatigue. Pertinent negatives for diabetes include no blurred vision, no chest pain, no polydipsia, no polyphagia and no polyuria. There are no hypoglycemic complications. Symptoms are improving. Diabetic complications include a CVA. (She has history of open heart surgery for ASD in 1965.) Risk factors for coronary artery disease include dyslipidemia, diabetes mellitus, family history, obesity, hypertension, post-menopausal, sedentary lifestyle and tobacco exposure. Current diabetic treatments: She is currently on Lantus 45 units nightly, Humalog 7 units 3 times daily AC, Metformin 1000 g p.o. twice daily, Trulicity 1.5 mg subcutaneou 50 units nig. Her weight is decreasing steadily. She is following a generally unhealthy diet. When asked about meal planning, she reported none. She has not had a previous visit with a dietitian. She rarely (She has medical problem with bronchiectasis for which she gets treated with steroids on and off.) participates in exercise. Her home blood glucose trend is  fluctuating minimally. (She did not bring any logs nor meter with her.  However her A1c was 7.7% in January 2022, increasing from 6.6% in 2020.) An ACE inhibitor/angiotensin II receptor blocker is being taken.  Hyperlipidemia This is a chronic problem. The current episode started more than 1 year ago. Exacerbating diseases include diabetes and obesity. Associated symptoms include myalgias. Pertinent negatives include no chest pain or shortness of breath. Current antihyperlipidemic treatment includes statins. Risk factors for coronary artery disease include dyslipidemia, diabetes mellitus, family history, obesity, hypertension, a sedentary lifestyle and post-menopausal.  Hypertension This is a chronic problem. The current episode started more than 1 year ago. The problem is uncontrolled. Pertinent negatives include no blurred vision, chest pain, headaches, palpitations or shortness of breath. Risk factors for coronary artery disease include dyslipidemia, diabetes mellitus, obesity, sedentary lifestyle and smoking/tobacco exposure. Past treatments include ACE inhibitors. Hypertensive end-organ damage includes CVA.  Other The current episode started more than 1 month ago. The problem occurs constantly. Progression since onset: Patient reports that she did have exposure to steroids related to back pain on 2 separate occasions between November 2000 21 and 2022.  Last exposure was related to her COVID infection.  She reports that she developed fatigueced that she developed fatigue. Associated symptoms include fatigue and myalgias. Pertinent negatives include no arthralgias, chest pain, chills, coughing, fever, headaches, nausea, rash or vomiting. She has tried nothing for the symptoms. The treatment provided no relief.  She is returning due to this last concern of fatigue for evaluation for adrenal sufficiency.      Objective:    BP (!) 142/58   Pulse 84   Ht $R'5\' 3"'Cd$  (1.6 m)   Wt 207 lb 9.6 oz (94.2 kg)    BMI 36.77 kg/m   Wt Readings from Last 3 Encounters:  11/25/20 207 lb 9.6 oz (94.2 kg)  11/13/20 207 lb (93.9 kg)  11/03/20 204 lb 9.6 oz (92.8 kg)     Has no recent CMP, A1c, thyroid function test, lipid panel to review.  Recent Results (from the past 2160 hour(s))  VITAMIN D 25 Hydroxy (Vit-D Deficiency, Fractures)     Status: None   Collection Time: 11/02/20 12:00 AM  Result Value Ref Range   Vit D, 25-Hydroxy 78.2   Basic metabolic panel     Status: None   Collection Time: 11/02/20 12:00 AM  Result Value Ref Range   Glucose 138    BUN 19 4 - 21   Creatinine 1.1 0.5 - 1.1   Potassium 4.2 3.4 - 5.3   Sodium 140 137 - 147   Chloride 104 99 - 108  Comprehensive metabolic panel     Status: Abnormal   Collection Time: 11/02/20 12:00 AM  Result Value Ref Range   Globulin 2.6    GFR calc Af Amer 63    GFR calc non Af Amer 54    Calcium 8.4 (A) 8.7 - 10.7   Albumin 4.0 3.5 - 5.0  Lipid panel     Status: None   Collection Time: 11/02/20 12:00 AM  Result Value Ref Range   Triglycerides 154 40 - 160   Cholesterol 142 0 - 200   HDL 39 35 - 70   LDL Cholesterol 76   Hepatic function panel     Status: None   Collection Time: 11/02/20 12:00 AM  Result Value Ref Range   Alkaline Phosphatase 69 25 - 125   ALT 8 7 - 35   AST 15 13 - 35   Bilirubin, Total 0.2   Vitamin B12     Status: None   Collection Time: 11/02/20 12:00 AM  Result Value Ref Range   Vitamin B-12 340   Hemoglobin A1c     Status: None   Collection Time: 11/02/20 12:00 AM  Result Value Ref Range   Hemoglobin A1C 7.7   ECHOCARDIOGRAM COMPLETE     Status: None   Collection Time: 11/12/20 11:17 AM  Result Value Ref Range   Area-P 1/2 3.99 cm2   S' Lateral 2.80 cm     Assessment & Plan:     1.  Fatigue: Likely multifactorial including sleep apnea.  In light of her exposure to steroids related to diffuse arthralgias as well as Covid infection, she will be given assessment for adrenal sufficiency. She will  be sent for ACTH stimulation test in [facility] infusion center and she will return in 2 weeks with results.   2. DM type 2 causing vascular disease ([place])   She did not bring any logs nor meter with her.  However her A1c was 7.7% in January 2022, increasing from 6.6% in 2020.  -her diabetes is complicated by CVA, history of open heart surgery for ASD, obesity/sedentary life, history of chronic smoking with bronchiectasis and Jhade Berko remains at a high risk for more acute and chronic complications which include CAD, CVA, CKD, retinopathy, and neuropathy. These are all discussed in detail with the patient.  - I have counseled her on diet management and weight loss, by adopting a carbohydrate restricted/protein rich diet.  - Suggestion is made for her to avoid simple carbohydrates  from her diet including Cakes, Sweet Desserts, Ice Cream, Soda (diet and regular), Sweet Tea, Candies, Chips, Cookies, Store Bought Juices, Alcohol in Excess of  1-2 drinks a day, Artificial Sweeteners,  Coffee Creamer, and "Sugar-free" Products, Lemonade. This will help patient to have more stable blood glucose profile and potentially avoid unintended weight gain.    - she is advised to stick to  a routine mealtimes to eat 3 meals  a day and avoid unnecessary snacks ( to snack only to correct hypoglycemia).   - she will be scheduled with Jearld Fenton, RDN, CDE for individualized diabetes education.  - I have approached her with the following individualized plan to manage diabetes and patient agrees:   -She is promising to continue on her significant change in her diet and exercise regimen as much as possible.   -She is advised to continue Lantus 45 units nightly, adjusted her Humalog to 5-11 units 3 times daily AC for Premeal blood glucose readings above 90 mg/day, associated with monitoring of blood glucose 4 times a day-daily before breakfast, lunch, supper, and at bedtime.  - Patient is warned not  to take insulin without proper monitoring per orders.  -Patient is encouraged to call clinic for blood glucose levels less than 70 or above 200 mg /dl. -Due to her vague GI concerns, she is advised to hold Metformin for now.   -She is advised to continue Trulicity 1.5 mg subcutaneously weekly.    - Patient specific target  A1c;  LDL, HDL, Triglycerides,  were discussed in detail.  3) BP/HTN: Her blood pressure is slightly above target, currently on the dementia medications including lisinopril.  Controlled to target.  She advised to continue her current blood pressure medications including hydralazine 25 mg 3 times daily.     4) Lipids/HPL: Her recent lipid panel showed LDL of 76, patient on atorvastatin 10 mg p.o. nightly.    5)  Weight/Diet: Her BMI is 36.7, will benefit from consult with dietitian.  I discussed and arranged for referral.     6) hypothyroidism-long-standing, etiology unclear, she has multiple family members with thyroid dysfunctions. -She does not have recent thyroid function tests.  She remains on levothyroxine 112 mcg p.o. daily before breakfast.    - We discussed about the correct intake of her thyroid hormone, on empty stomach at fasting, with water, separated by at least 30 minutes from breakfast and other medications,  and separated by more than 4 hours from calcium, iron, multivitamins, acid reflux medications (PPIs). -Patient is made aware of the fact that thyroid hormone replacement is needed for life, dose to be adjusted by periodic monitoring of thyroid function tests.  7) vitamin D deficiency: Her vitamin D 16.5.  She is currently on inadequate vitamin D supplement.  I discussed and prescribed vitamin D3 5000 units daily for the next 90 days.  Her insurance did not cover vitamin D2.  8) Chronic Care/Health Maintenance:  -she  is on  Statin medications and  is encouraged to continue to follow up with Ophthalmology, Dentist,  Podiatrist at least yearly or  according to recommendations, and advised to  stay away from smoking. I have recommended yearly flu vaccine and pneumonia vaccination at least every 5 years;  and  sleep for at least 7 hours a day.  - I advised patient to maintain close follow up with [name], MD for primary care needs.  - Time spent on this patient care encounter:  40 min, of which > 50% was spent in  counseling and the rest reviewing her blood glucose logs , discussing her hypoglycemia and hyperglycemia episodes, reviewing her current and  previous labs / studies  ( including abstraction from other facilities) and medications  doses and developing a  long term treatment plan and documenting her care.   Please refer to Patient Instructions for Blood Glucose Monitoring and Insulin/Medications Dosing  Guide"  in media tab for additional information. Please  also refer to " Patient Self Inventory" in the Media  tab for reviewed elements of pertinent patient history.  Amira Podolak participated in the discussions, expressed understanding, and voiced agreement with the above plans.  All questions were answered to her satisfaction. she is encouraged to contact clinic should she have any questions or concerns prior to her return visit.    Follow up plan: - Return in about 2 weeks (around 12/09/2020) for F/U with Pre-visit Labs.  Glade Lloyd, MD [facility] [address] [place], [place] [address] Phone: [phone]  Fax: [phone]    11/25/2020, 3:39 PM  This note was partially dictated with voice recognition software. Similar sounding words can be transcribed inadequately or may not  be corrected upon review.

## 2020-11-28 DIAGNOSIS — Z20822 Contact with and (suspected) exposure to covid-19: Secondary | ICD-10-CM | POA: Diagnosis not present

## 2020-11-28 DIAGNOSIS — N3000 Acute cystitis without hematuria: Secondary | ICD-10-CM | POA: Diagnosis not present

## 2020-11-28 DIAGNOSIS — D803 Selective deficiency of immunoglobulin G [IgG] subclasses: Secondary | ICD-10-CM | POA: Diagnosis not present

## 2020-11-28 DIAGNOSIS — R252 Cramp and spasm: Secondary | ICD-10-CM | POA: Diagnosis not present

## 2020-11-28 DIAGNOSIS — I509 Heart failure, unspecified: Secondary | ICD-10-CM | POA: Diagnosis not present

## 2020-11-28 DIAGNOSIS — I482 Chronic atrial fibrillation, unspecified: Secondary | ICD-10-CM | POA: Diagnosis not present

## 2020-11-28 DIAGNOSIS — F4310 Post-traumatic stress disorder, unspecified: Secondary | ICD-10-CM | POA: Diagnosis not present

## 2020-11-28 DIAGNOSIS — M545 Low back pain, unspecified: Secondary | ICD-10-CM | POA: Diagnosis not present

## 2020-11-28 DIAGNOSIS — G250 Essential tremor: Secondary | ICD-10-CM | POA: Diagnosis not present

## 2020-11-28 DIAGNOSIS — E039 Hypothyroidism, unspecified: Secondary | ICD-10-CM | POA: Diagnosis not present

## 2020-11-28 DIAGNOSIS — J45909 Unspecified asthma, uncomplicated: Secondary | ICD-10-CM | POA: Diagnosis not present

## 2020-11-28 DIAGNOSIS — J4550 Severe persistent asthma, uncomplicated: Secondary | ICD-10-CM | POA: Diagnosis not present

## 2020-11-28 DIAGNOSIS — E1142 Type 2 diabetes mellitus with diabetic polyneuropathy: Secondary | ICD-10-CM | POA: Diagnosis not present

## 2020-11-28 DIAGNOSIS — R059 Cough, unspecified: Secondary | ICD-10-CM | POA: Diagnosis not present

## 2020-11-28 DIAGNOSIS — M25562 Pain in left knee: Secondary | ICD-10-CM | POA: Diagnosis not present

## 2020-11-28 DIAGNOSIS — E782 Mixed hyperlipidemia: Secondary | ICD-10-CM | POA: Diagnosis not present

## 2020-11-28 DIAGNOSIS — F319 Bipolar disorder, unspecified: Secondary | ICD-10-CM | POA: Diagnosis not present

## 2020-11-28 DIAGNOSIS — M25512 Pain in left shoulder: Secondary | ICD-10-CM | POA: Diagnosis not present

## 2020-11-28 DIAGNOSIS — R197 Diarrhea, unspecified: Secondary | ICD-10-CM | POA: Diagnosis not present

## 2020-11-28 DIAGNOSIS — M5441 Lumbago with sciatica, right side: Secondary | ICD-10-CM | POA: Diagnosis not present

## 2020-11-28 DIAGNOSIS — I10 Essential (primary) hypertension: Secondary | ICD-10-CM | POA: Diagnosis not present

## 2020-11-28 DIAGNOSIS — F0630 Mood disorder due to known physiological condition, unspecified: Secondary | ICD-10-CM | POA: Diagnosis not present

## 2020-11-28 DIAGNOSIS — G9009 Other idiopathic peripheral autonomic neuropathy: Secondary | ICD-10-CM | POA: Diagnosis not present

## 2020-11-30 DIAGNOSIS — M25512 Pain in left shoulder: Secondary | ICD-10-CM | POA: Diagnosis not present

## 2020-11-30 DIAGNOSIS — I509 Heart failure, unspecified: Secondary | ICD-10-CM | POA: Diagnosis not present

## 2020-11-30 DIAGNOSIS — Z20822 Contact with and (suspected) exposure to covid-19: Secondary | ICD-10-CM | POA: Diagnosis not present

## 2020-11-30 DIAGNOSIS — D803 Selective deficiency of immunoglobulin G [IgG] subclasses: Secondary | ICD-10-CM | POA: Diagnosis not present

## 2020-11-30 DIAGNOSIS — J45909 Unspecified asthma, uncomplicated: Secondary | ICD-10-CM | POA: Diagnosis not present

## 2020-11-30 DIAGNOSIS — R197 Diarrhea, unspecified: Secondary | ICD-10-CM | POA: Diagnosis not present

## 2020-11-30 DIAGNOSIS — F319 Bipolar disorder, unspecified: Secondary | ICD-10-CM | POA: Diagnosis not present

## 2020-11-30 DIAGNOSIS — M47896 Other spondylosis, lumbar region: Secondary | ICD-10-CM | POA: Diagnosis not present

## 2020-11-30 DIAGNOSIS — R059 Cough, unspecified: Secondary | ICD-10-CM | POA: Diagnosis not present

## 2020-11-30 DIAGNOSIS — M545 Low back pain, unspecified: Secondary | ICD-10-CM | POA: Diagnosis not present

## 2020-11-30 DIAGNOSIS — M4802 Spinal stenosis, cervical region: Secondary | ICD-10-CM | POA: Diagnosis not present

## 2020-11-30 DIAGNOSIS — G9009 Other idiopathic peripheral autonomic neuropathy: Secondary | ICD-10-CM | POA: Diagnosis not present

## 2020-11-30 DIAGNOSIS — R252 Cramp and spasm: Secondary | ICD-10-CM | POA: Diagnosis not present

## 2020-11-30 DIAGNOSIS — M25562 Pain in left knee: Secondary | ICD-10-CM | POA: Diagnosis not present

## 2020-11-30 DIAGNOSIS — M488X2 Other specified spondylopathies, cervical region: Secondary | ICD-10-CM | POA: Diagnosis not present

## 2020-11-30 NOTE — Telephone Encounter (Signed)
Patient was seen by [name] on 11/25/20 and she has a follow up on 12/10/20 with him.

## 2020-12-02 ENCOUNTER — Ambulatory Visit (HOSPITAL_COMMUNITY): Payer: Medicare Other

## 2020-12-02 NOTE — Progress Notes (Signed)
Virtual Visit Via Video   The purpose of this virtual visit is to provide medical care while limiting exposure to the novel coronavirus.    Consent was obtained for video visit:  Yes.   Answered questions that patient had about telehealth interaction:  Yes.   I discussed the limitations, risks, security and privacy concerns of performing an evaluation and management service by telemedicine. I also discussed with the patient that there may be a patient responsible charge related to this service. The patient expressed understanding and agreed to proceed.  Pt location: Home Physician Location: office Name of referring provider:  [name], MD I connected with [name] at patients initiation/request on 12/04/2020 at  8:15 AM EST by video enabled telemedicine application and verified that I am speaking with the correct person using two identifiers. Pt MRN:  [id number] Pt DOB:  1960/10/27 Video Participants:  [name];    Assessment/Plan:   1.  Tremor  -Discussed with patient again that, at the very least, this is exacerbated by medication if not caused by them.  We have discussed previously as well as today that one cannot diagnose essential tremor when one is on medications that induce tremor.  She is on both psychiatric medications that induce tremor (Depakote) as well as asthma medications that induce tremor.  In addition, anxiety alone can exacerbate tremor.  In my experience, it is difficult to give patient's medications to control tremor when they are on tremor inducing medications.  We tried to do this last visit with starting her on topiramate and she did not find it beneficial.  Primidone is not an option for her because of interaction with Eliquis.  Ultimately, I suggested that we increase the topiramate to 100 mg bid.  Studies have shown that topiramate dosages needed to be fairly high in order to get adequate control on tremor.  That being said, I told the patient that if  this does not prove to be beneficial, I really would not recommend other tremor medications, nor would I recommend going up further on this.  She has just on too many tremor inducing medications to continue to try to add more medications to counteract the side effects of the ones inducing tremor.  2.  Hand paresthesias  -schedule EMG  3.  Cervical spinal stenosis  -following with Dr. Christella Noa  Subjective   Patient seen today in follow-up for tremor.  Patient requested work in appointment.  She did not think that Topamax has been particularly helpful.  She has no SE.  States that she had a fall after our last visit and was put on prednisone for LBP and then was put on it again in December for covid and gained a lot of weight b/c of it (she is also diabetic).  She is being w/u for adrenal insufficiency.  She is following with neurosx now for the LBP (Dr. Christella Noa) - told she didn't have a surgical issue and told she needed PT.  She did have mod to severe CCS in the cervical spine and will be f/u with him again regarding that.  She also c/o hand paresthesias.  She has had CTS in the past but the numbness is "new" over the thumb, middle and pointer fingers, L>R.    She has known diabetic peripheral neuropathy Current movement disorder medications: Topiramate, 100 mg daily   Current medications that may exacerbate tremor:  Depakote (been on many years), Spiriva, Dulera previously - now symbicort; singulair  Past  psychiatric meds:  lithium, carbamazepine, risperidone,Geodon, olanzapine, quetiapine,Abilify, Ingrezza   Current Outpatient Medications on File Prior to Visit  Medication Sig Dispense Refill  . acetaminophen (TYLENOL) 325 MG tablet Take 2 tablets (650 mg total) by mouth every 6 (six) hours as needed for mild pain, fever or headache. 30 tablet 1  . albuterol (PROVENTIL) (2.5 MG/3ML) 0.083% nebulizer solution Take 3 mLs (2.5 mg total) by nebulization every 4 (four) hours as needed for  wheezing or shortness of breath. 75 mL 1  . albuterol (VENTOLIN HFA) 108 (90 Base) MCG/ACT inhaler INHALE 2 PUFFS INTO THE LUNGS EVERY 6 HOURS AS NEEDED FOR WHEEZING OR SHORTNESS OF BREATH 18 g 1  . apixaban (ELIQUIS) 5 MG TABS tablet Take 5 mg by mouth 2 (two) times daily.    Marland Kitchen atorvastatin (LIPITOR) 10 MG tablet Take 10 mg by mouth every evening.     . BD PEN NEEDLE NANO 2ND GEN 32G X 4 MM MISC SMARTSIG:Injection 4 Times Daily    . Benralizumab (FASENRA PEN) 30 MG/ML SOAJ Inject 30 mg into the skin every 8 (eight) weeks. 1 mL 6  . Budeson-Glycopyrrol-Formoterol (BREZTRI AEROSPHERE) 160-9-4.8 MCG/ACT AERO Inhale 1 puff into the lungs 2 (two) times daily.    . Cholecalciferol (VITAMIN D3) 125 MCG (5000 UT) CAPS Take 1 capsule (5,000 Units total) by mouth daily. 90 capsule 0  . diltiazem (CARDIZEM CD) 360 MG 24 hr capsule Take 1 capsule (360 mg total) by mouth daily. 90 capsule 3  . divalproex (DEPAKOTE ER) 500 MG 24 hr tablet 500 mg in AM, 1000 mg at night 270 tablet 0  . Dulaglutide 1.5 MG/0.5ML SOPN Inject 1.5 mg into the skin every Sunday.     Marland Kitchen EPINEPHrine 0.3 mg/0.3 mL IJ SOAJ injection     . gabapentin (NEURONTIN) 600 MG tablet Take 300 mg by mouth 3 (three) times daily.     Marland Kitchen HUMALOG KWIKPEN 100 UNIT/ML KwikPen Inject 5-11 Units into the skin 3 (three) times daily before meals. Sliding scale.    . hydrALAZINE (APRESOLINE) 25 MG tablet Take 25 mg by mouth 3 (three) times daily.    . hydrOXYzine (ATARAX/VISTARIL) 25 MG tablet Take 1 tablet (25 mg total) by mouth every 6 (six) hours as needed for anxiety or nausea. 30 tablet 0  . Immune Globulin, Human, 4 GM/20ML SOLN Inject 100 mLs into the skin every 14 (fourteen) days.    Marland Kitchen LANTUS SOLOSTAR 100 UNIT/ML Solostar Pen Inject 45 Units into the skin.     Marland Kitchen levothyroxine (SYNTHROID, LEVOTHROID) 112 MCG tablet Take 112 mcg by mouth daily before breakfast.    . Magnesium Oxide 400 MG CAPS Take 1 capsule (400 mg total) by mouth daily. 90 capsule 3   . Melatonin 3 MG TABS Take 3 mg by mouth at bedtime.    . Nebulizer MISC Nebulizer tubing kit 2 each 5  . omeprazole (PRILOSEC) 20 MG capsule TAKE 1 CAPSULE(20 MG) BY MOUTH DAILY (Patient taking differently: As needed) 90 capsule 1  . ondansetron (ZOFRAN) 4 MG tablet Take 1 tablet (4 mg total) by mouth every 8 (eight) hours as needed for nausea or vomiting. 20 tablet 0  . ONETOUCH ULTRA test strip USE TO CHECK BLOOD SUGAR FOUR TIMES DAILY    . potassium chloride SA (KLOR-CON) 20 MEQ tablet Take 20 mEq by mouth daily.    . Tiotropium Bromide Monohydrate (SPIRIVA RESPIMAT) 1.25 MCG/ACT AERS Inhale 2 puffs into the lungs daily. 4 g 5  .  topiramate (TOPAMAX) 100 MG tablet Take 1 tablet (100 mg total) by mouth daily. 90 tablet 1  . torsemide (DEMADEX) 20 MG tablet Take 2 tablets (40 mg total) by mouth daily. 180 tablet 3  . metFORMIN (GLUCOPHAGE) 500 MG tablet Take 1,000 mg by mouth 2 (two) times daily. (Patient not taking: Reported on 12/04/2020)     Current Facility-Administered Medications on File Prior to Visit  Medication Dose Route Frequency Provider Last Rate Last Admin  . Benralizumab SOSY 30 mg  30 mg Subcutaneous Q28 days Valentina Shaggy, MD   30 mg at 07/03/20 1017     Objective   Vitals:   12/04/20 0742  Weight: 204 lb (92.5 kg)  Height: 5' 4" (1.626 m)   GEN:  The patient appears stated age and is in NAD.  Neurological examination:  Orientation: The patient is alert and oriented x3. Cranial nerves: There is good facial symmetry. There is no facial hypomimia.  The speech is fluent and clear. Soft palate rises symmetrically and there is no tongue deviation. Hearing is intact to conversational tone. Motor: Strength is at least antigravity x 4.   Shoulder shrug is equal and symmetric.  There is no pronator drift.  Movement examination: Tone: unable Abnormal movements: there is postural tremor on the R, mild Coordination:  There is no decremation with  RAM's     Follow up Instructions      -I discussed the assessment and treatment plan with the patient. The patient was provided an opportunity to ask questions and all were answered. The patient agreed with the plan and demonstrated an understanding of the instructions.   The patient was advised to call back or seek an in-person evaluation if the symptoms worsen or if the condition fails to improve as anticipated.    Total time spent on today's visit was 20 minutes, including both face-to-face time and nonface-to-face time.  Time included that spent on review of records (prior notes available to me/labs/imaging if pertinent), discussing treatment and goals, answering patient's questions and coordinating care.   Alonza Bogus, DO

## 2020-12-03 ENCOUNTER — Encounter (HOSPITAL_COMMUNITY)
Admission: RE | Admit: 2020-12-03 | Discharge: 2020-12-03 | Disposition: A | Discharge status: Home / Self Care | Payer: Medicare Other | Source: Ambulatory Visit | Attending: "Endocrinology | Admitting: "Endocrinology

## 2020-12-03 ENCOUNTER — Other Ambulatory Visit: Payer: Self-pay

## 2020-12-03 ENCOUNTER — Encounter (HOSPITAL_COMMUNITY): Payer: Self-pay

## 2020-12-03 ENCOUNTER — Ambulatory Visit (HOSPITAL_COMMUNITY): Payer: Medicare Other | Attending: Orthopedic Surgery | Admitting: Physical Therapy

## 2020-12-03 DIAGNOSIS — M25552 Pain in left hip: Secondary | ICD-10-CM

## 2020-12-03 DIAGNOSIS — M79605 Pain in left leg: Secondary | ICD-10-CM

## 2020-12-03 DIAGNOSIS — R5383 Other fatigue: Secondary | ICD-10-CM | POA: Insufficient documentation

## 2020-12-03 DIAGNOSIS — M545 Low back pain, unspecified: Secondary | ICD-10-CM | POA: Insufficient documentation

## 2020-12-03 LAB — ACTH STIMULATION, 3 TIME POINTS
Cortisol, 30 Min: 23.4 ug/dL
Cortisol, 60 Min: 27.5 ug/dL
Cortisol, Base: 7.6 ug/dL

## 2020-12-03 MED ORDER — COSYNTROPIN 0.25 MG IJ SOLR
0.2500 mg | Freq: Once | INTRAMUSCULAR | Status: AC
  Administered 2020-12-03: 0.25 mg via INTRAVENOUS

## 2020-12-03 NOTE — Therapy (Signed)
[name], [place], [address] Phone: [phone]   Fax:  [phone]  Physical Therapy Evaluation  Patient Details  Name: Laura [name] MRN: [id number] Date of Birth: 1961/08/01 Referring Provider (PT): Matthew Saras [place]   Encounter Date: 12/03/2020   PT End of Session - 12/03/20 1126    Visit Number 1    Number of Visits 12    Date for PT Re-Evaluation 01/14/21    Authorization Type Medicare A, Medcaid 2nd    Progress Note Due on Visit 10    PT Start Time 1045    PT Stop Time 1120    PT Time Calculation (min) 35 min    Activity Tolerance Patient limited by pain    Behavior During Therapy Anxious           Past Medical History:  Diagnosis Date  . Asthma   . Atrial fibrillation ([place])   . Bipolar disorder ([place])   . Bronchiectasis ([place])   . CHF (congestive heart failure) ([place])   . Complication of anesthesia    "my Dr in [place] told me I had an allergic reaction to the combination of the pain meds and the anesthesia" she does not remember what happened but "I eneded up in the ICU for 5 days".  . H/O congenital atrial septal defect (ASD) repair   . Hypogammaglobulinemia ([place])   . Hypothyroid   . 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 Neuropathy   . Osteoarthritis   . PTSD (post-traumatic stress disorder)   . Recurrent sinusitis   . Short-term memory loss   . Sleep apnea   . TIA (transient ischemic attack)   . Tremors of nervous system    seeing PCP for this.  . Type 2 diabetes mellitus ([place])   . Urticaria   . Wound infection     Past Surgical History:  Procedure Laterality Date  . 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 CARDIAC SURGERY    . CARDIOVERSION    . CARDIOVERSION N/A 08/29/2018   Procedure: CARDIOVERSION;  Surgeon: Arnoldo Lenis, MD;  [facility];  Service: Endoscopy;  Laterality: N/A;  . [place]  . LEFT HEART CATH AND CORONARY ANGIOGRAPHY N/A 08/30/2019    Procedure: LEFT HEART CATH AND CORONARY ANGIOGRAPHY;  Surgeon: [name], MD;  Location: [facility];  Service: Cardiovascular;  Laterality: N/A;  . NASAL SINUS SURGERY  2017    There were no vitals filed for this visit.    Subjective Assessment - 12/03/20 1059    Subjective States she saw neurosurgeon and they don't want to do surgery right now and she got her MRIs. States that they want her to do PT right now. States that he is concerned about the neck pain and she has a follow up with him in 4 weeks. States she has no space in her neck area. States that the surgeon doesn't know what's causing the pain, and they also discovered a cyst on her  kidney and spine and is following up with a kidney MD on March first. States that she has been having a lot of stomach pain.    Pertinent History DB, PTSD, SHF, BBipolar, tremors, depression    Limitations Sitting;Standing;Walking;House hold activities;Lifting    Patient Stated Goals to be able to walk a mile    Currently in Pain? Yes    Pain Score 7     Pain Location Back  Pain Orientation Right;Lower;Lateral;Anterior   and stomach   Pain Descriptors / Indicators Shooting;Cramping;Aching;Constant    Pain Type Chronic pain    Pain Radiating Towards to front of stomach    Pain Onset More than a month ago    Pain Frequency Constant    Aggravating Factors  heat, sitting, doing anythign for prolonged periods of time, excessive pressure.    Pain Relieving Factors tylenol, ice, pressure              [facility] PT Assessment - 12/03/20 0001      Assessment   Medical Diagnosis neck pain wiht radiculopathy and low back pain    Referring Provider (PT) Matthew Saras PA    Prior Therapy yes for current issue      Precautions   Precautions None      Balance Screen   Has the patient fallen in the past 6 months No      [place] residence    Living Arrangements Other relatives    Available  Help at Discharge Family    Type of [place]      Prior Function   Level of Independence Independent      Cognition   Overall Cognitive Status Within Functional Limits for tasks assessed      Observation/Other Assessments   Focus on Therapeutic Outcomes (FOTO)  41% function (neck)      AROM   AROM Assessment Site Cervical;Shoulder;Lumbar    Right/Left Shoulder Right;Left    Right Shoulder Flexion 155 Degrees   slight discomfort in sholder joint   Right Shoulder ABduction 160 Degrees   slight discomfort in shoulder   Right Shoulder Internal Rotation --   to T12 SP   Right Shoulder External Rotation --   to top of ipsilateral shoulder blade   Left Shoulder Flexion 130 Degrees   heaviness   Left Shoulder ABduction 95 Degrees   pain all down left arm and shoulder   Left Shoulder Internal Rotation --   to left PSIS - painful   Left Shoulder External Rotation --   to top of ipsilateral shoulder blade, painful   Cervical Flexion 52   pain in back of neck   Cervical Extension 20   pain in back of neck   Cervical - Right Side Bend 15   pain in back of neck   Cervical - Left Side Bend 8   pain in back of neck   Cervical - Right Rotation 55   pain in back of neck   Cervical - Left Rotation 60   pain in back of neck   Lumbar Extension 75% limited   no change in symptoms   Lumbar - Right Side Bend 100% limited   increased symptoms in back   Lumbar - Left Side Bend 100% limited   good stretch on the right   Lumbar - Right Rotation 75% limited   a good stretch   Lumbar - Left Rotation 100% limited   increased pain on the right side.                     Objective measurements completed on examination: See above findings.               PT Education - 12/03/20 1130    Education Details on current condition, plan and benefits of aquatics    Person(s) Educated Patient    Methods Explanation    Comprehension Verbalized  understanding            PT Short Term Goals  - 12/03/20 1052      PT SHORT TERM GOAL #1   Title Patient will be independent with initial HEP and self-management strategies to improve functional outcomes    Time 3    Period Weeks    Status New    Target Date 12/24/20      PT SHORT TERM GOAL #2   Title Patient will be able to demonstrate at least 150 degrees of left active shoulder ROM while sitting to demosntrate improved shoulder mobility.    Time 3    Period Weeks    Status New    Target Date 12/24/20      PT SHORT TERM GOAL #3   Title Patient will report at least 25% improvement in overall symptoms and/or function to demonstrate improved functional mobility             PT Long Term Goals - 12/03/20 1052      PT LONG TERM GOAL #1   Title Patient will improve on FOTO score to meet predicted outcomes to demonstrate improved functional mobility.    Time 6    Period Weeks    Status New    Target Date 01/14/21      PT LONG TERM GOAL #2   Title Patient will report at least 50% overall improvement in subjective complaint to indicate improvement in ability to perform ADLs.    Time 6    Period Weeks    Status New    Target Date 01/14/21      PT LONG TERM GOAL #3   Title Patient will be able to walk a mile to demonstrate improved walking endurance    Time 6    Period Weeks    Status New    Target Date 01/14/21                  Plan - 12/03/20 1118    Clinical Impression Statement Patient is known to therapy with recent treatment of lumbar pain and returns now that she has been cleared by MD to continue with physical therapy. Patient presents with anxious presentation and fear of moving despite some movements performed during session feeling good. Educated patient in current presentation and how physical therapy will help with current symptoms. To note, patient with known cyst on kidney and abdominal pains, she is seeing a specialist on 12/08/20. It is possible that lumbar symptoms are related to potential kidney  dysfunction. Will follow up with patient after specialist visit. Patient would greatly benefit from skilled physical therapy to improve overall function and quality of life.    Personal Factors and Comorbidities Comorbidity 3+;Comorbidity 2;Comorbidity 1    Comorbidities COPD, Asthma, HTN, A-fib, DM    Stability/Clinical Decision Making Evolving/Moderate complexity    Clinical Decision Making Moderate    Rehab Potential Good    PT Frequency 2x / week    PT Duration 6 weeks    PT Treatment/Interventions ADLs/Self Care Home Management;Aquatic Therapy;Biofeedback;Canalith Repostioning;Cryotherapy;Electrical Stimulation;Iontophoresis 4mg /ml Dexamethasone;Moist Heat;Traction;Balance training;Manual lymph drainage;Manual techniques;Vestibular;Vasopneumatic Device;Taping;Splinting;Orthotic Fit/Training;Energy conservation;Stair training;Functional mobility training;Therapeutic activities;Therapeutic exercise;Gait training;DME Instruction;Patient/family education;Passive range of motion;Dry needling;Joint Manipulations;Spinal Manipulations;Compression bandaging;Visual/perceptual remediation/compensation;Scar mobilization;Neuromuscular re-education;Ultrasound;Parrafin;Fluidtherapy;Contrast Bath    PT Next Visit Plan focus on mobility, assess strength in neck/shoulder and back/lower extremities, focus on lumbar spine but incorporate upper extremity and neck    PT Home Exercise Plan lumbar extension standing    Consulted and  Agree with Plan of Care Patient           Patient will benefit from skilled therapeutic intervention in order to improve the following deficits and impairments:  Abnormal gait,Impaired flexibility,Postural dysfunction,Improper body mechanics,Decreased range of motion,Decreased balance,Decreased mobility,Difficulty walking,Pain,Increased fascial restricitons,Decreased strength,Decreased activity tolerance,Hypomobility  Visit Diagnosis: Low back pain, unspecified back pain laterality,  unspecified chronicity, unspecified whether sciatica present  Pain in left hip  Pain in left leg     Problem List Patient Active Problem List   Diagnosis Date Noted  . Abnormal EKG   . Chronic diastolic (congestive) heart failure ([place]) 08/29/2019  . Shortness of breath 08/29/2019  . Bipolar disorder ([place]) 08/10/2018  . Headache 08/10/2018  . H/O atrial flutter 08/10/2018  . OSA on CPAP 08/10/2018  . Anxiety 08/10/2018  . Uncontrolled type 2 diabetes mellitus with hyperglycemia ([place]) 03/13/2018  . Hypothyroidism 03/13/2018  . Essential hypertension, benign 03/13/2018  . Mixed hyperlipidemia 03/13/2018  . Hypogammaglobulinemia ([place]) 02/06/2018  . Non-allergic rhinitis 02/06/2018  . Severe persistent asthma, uncomplicated 38/88/7579  . Pulmonary nodules 02/06/2018  . Bronchiectasis without complication ([place]) 72/82/0601  . DM type 2 causing vascular disease ([place]) 02/06/2018   11:31 AM, 12/03/20 Jerene Pitch, DPT Physical Therapy with [facility]  [phone] office  [facility] [address] [place], [place], [address] Phone: [phone]   Fax:  [phone]  Name: Laura [name] MRN: [id number] Date of Birth: 05/18/1961

## 2020-12-03 NOTE — Telephone Encounter (Signed)
Dr Emi Holes office states the patient has to see him because of her diagnosis.  Patient states she talk to Dr Dorris Fetch and she is okay with sticking with see him.   Thanks

## 2020-12-04 ENCOUNTER — Telehealth (INDEPENDENT_AMBULATORY_CARE_PROVIDER_SITE_OTHER): Payer: Medicare Other | Admitting: Neurology

## 2020-12-04 ENCOUNTER — Encounter: Payer: Self-pay | Admitting: Neurology

## 2020-12-04 VITALS — Ht 64.0 in | Wt 204.0 lb

## 2020-12-04 DIAGNOSIS — R251 Tremor, unspecified: Secondary | ICD-10-CM | POA: Diagnosis not present

## 2020-12-04 DIAGNOSIS — R202 Paresthesia of skin: Secondary | ICD-10-CM

## 2020-12-04 MED ORDER — TOPIRAMATE 100 MG PO TABS
100.0000 mg | ORAL_TABLET | Freq: Two times a day (BID) | ORAL | 1 refills | Status: DC
Start: 2020-12-04 — End: 2021-08-05

## 2020-12-04 NOTE — Addendum Note (Signed)
Addended by: Ulice Brilliant T on: 12/04/2020 09:41 AM   Modules accepted: Orders

## 2020-12-05 NOTE — Telephone Encounter (Signed)
Thanks for looking into that.  Salvatore Marvel, MD [facility]

## 2020-12-07 ENCOUNTER — Emergency Department (HOSPITAL_COMMUNITY): Payer: Medicare Other

## 2020-12-07 ENCOUNTER — Emergency Department (HOSPITAL_COMMUNITY)
Admission: EM | Admit: 2020-12-07 | Discharge: 2020-12-07 | Disposition: A | Discharge status: Home / Self Care | Payer: Medicare Other | Attending: Emergency Medicine | Admitting: Emergency Medicine

## 2020-12-07 DIAGNOSIS — S0003XA Contusion of scalp, initial encounter: Secondary | ICD-10-CM | POA: Diagnosis not present

## 2020-12-07 DIAGNOSIS — S0990XA Unspecified injury of head, initial encounter: Secondary | ICD-10-CM | POA: Diagnosis not present

## 2020-12-07 DIAGNOSIS — S0083XA Contusion of other part of head, initial encounter: Secondary | ICD-10-CM

## 2020-12-07 DIAGNOSIS — M25521 Pain in right elbow: Secondary | ICD-10-CM | POA: Diagnosis not present

## 2020-12-07 DIAGNOSIS — M25559 Pain in unspecified hip: Secondary | ICD-10-CM | POA: Diagnosis not present

## 2020-12-07 DIAGNOSIS — M25562 Pain in left knee: Secondary | ICD-10-CM | POA: Diagnosis not present

## 2020-12-07 DIAGNOSIS — S3993XA Unspecified injury of pelvis, initial encounter: Secondary | ICD-10-CM | POA: Diagnosis not present

## 2020-12-07 DIAGNOSIS — S299XXA Unspecified injury of thorax, initial encounter: Secondary | ICD-10-CM | POA: Diagnosis not present

## 2020-12-07 DIAGNOSIS — M549 Dorsalgia, unspecified: Secondary | ICD-10-CM | POA: Diagnosis not present

## 2020-12-07 DIAGNOSIS — Z043 Encounter for examination and observation following other accident: Secondary | ICD-10-CM | POA: Diagnosis not present

## 2020-12-07 DIAGNOSIS — R52 Pain, unspecified: Secondary | ICD-10-CM | POA: Diagnosis not present

## 2020-12-07 DIAGNOSIS — R519 Headache, unspecified: Secondary | ICD-10-CM | POA: Diagnosis not present

## 2020-12-07 DIAGNOSIS — G8929 Other chronic pain: Secondary | ICD-10-CM | POA: Insufficient documentation

## 2020-12-07 DIAGNOSIS — M542 Cervicalgia: Secondary | ICD-10-CM | POA: Diagnosis not present

## 2020-12-07 DIAGNOSIS — I4891 Unspecified atrial fibrillation: Secondary | ICD-10-CM | POA: Diagnosis not present

## 2020-12-07 DIAGNOSIS — R0689 Other abnormalities of breathing: Secondary | ICD-10-CM | POA: Diagnosis not present

## 2020-12-07 DIAGNOSIS — T07XXXA Unspecified multiple injuries, initial encounter: Secondary | ICD-10-CM

## 2020-12-07 DIAGNOSIS — W01198A Fall on same level from slipping, tripping and stumbling with subsequent striking against other object, initial encounter: Secondary | ICD-10-CM | POA: Diagnosis not present

## 2020-12-07 DIAGNOSIS — M25531 Pain in right wrist: Secondary | ICD-10-CM | POA: Insufficient documentation

## 2020-12-07 DIAGNOSIS — M25561 Pain in right knee: Secondary | ICD-10-CM | POA: Diagnosis not present

## 2020-12-07 DIAGNOSIS — D68318 Other hemorrhagic disorder due to intrinsic circulating anticoagulants, antibodies, or inhibitors: Secondary | ICD-10-CM | POA: Diagnosis not present

## 2020-12-07 DIAGNOSIS — Z7901 Long term (current) use of anticoagulants: Secondary | ICD-10-CM

## 2020-12-07 DIAGNOSIS — S199XXA Unspecified injury of neck, initial encounter: Secondary | ICD-10-CM | POA: Diagnosis not present

## 2020-12-07 LAB — CBC
HCT: 39.1 % (ref 36.0–46.0)
Hemoglobin: 12.8 g/dL (ref 12.0–15.0)
MCH: 32.7 pg (ref 26.0–34.0)
MCHC: 32.7 g/dL (ref 30.0–36.0)
MCV: 100 fL (ref 80.0–100.0)
Platelets: 182 10*3/uL (ref 150–400)
RBC: 3.91 MIL/uL (ref 3.87–5.11)
RDW: 13.9 % (ref 11.5–15.5)
WBC: 5.9 10*3/uL (ref 4.0–10.5)
nRBC: 0 % (ref 0.0–0.2)

## 2020-12-07 LAB — BASIC METABOLIC PANEL
Anion gap: 10 (ref 5–15)
BUN: 13 mg/dL (ref 6–20)
CO2: 21 mmol/L — ABNORMAL LOW (ref 22–32)
Calcium: 9 mg/dL (ref 8.9–10.3)
Chloride: 108 mmol/L (ref 98–111)
Creatinine, Ser: 0.64 mg/dL (ref 0.44–1.00)
GFR, Estimated: >60 mL/min (ref >60)
Glucose, Bld: 70 mg/dL (ref 70–99)
Potassium: 3.6 mmol/L (ref 3.5–5.1)
Sodium: 139 mmol/L (ref 135–145)

## 2020-12-07 LAB — PROTIME-INR
INR: 1.1 (ref 0.8–1.2)
Prothrombin Time: 13.8 seconds (ref 11.4–15.2)

## 2020-12-07 LAB — TYPE AND SCREEN
ABO/RH(D): O NEG
Antibody Screen: NEGATIVE

## 2020-12-07 IMAGING — DX DG PORTABLE PELVIS
1 series · 1 of 1 positions shown · non-contrast
Comparison: None.

CLINICAL DATA: Trauma

Fall
Hip pain
EXAM:
PORTABLE PELVIS 1-2 VIEWS

[pelvis ap]
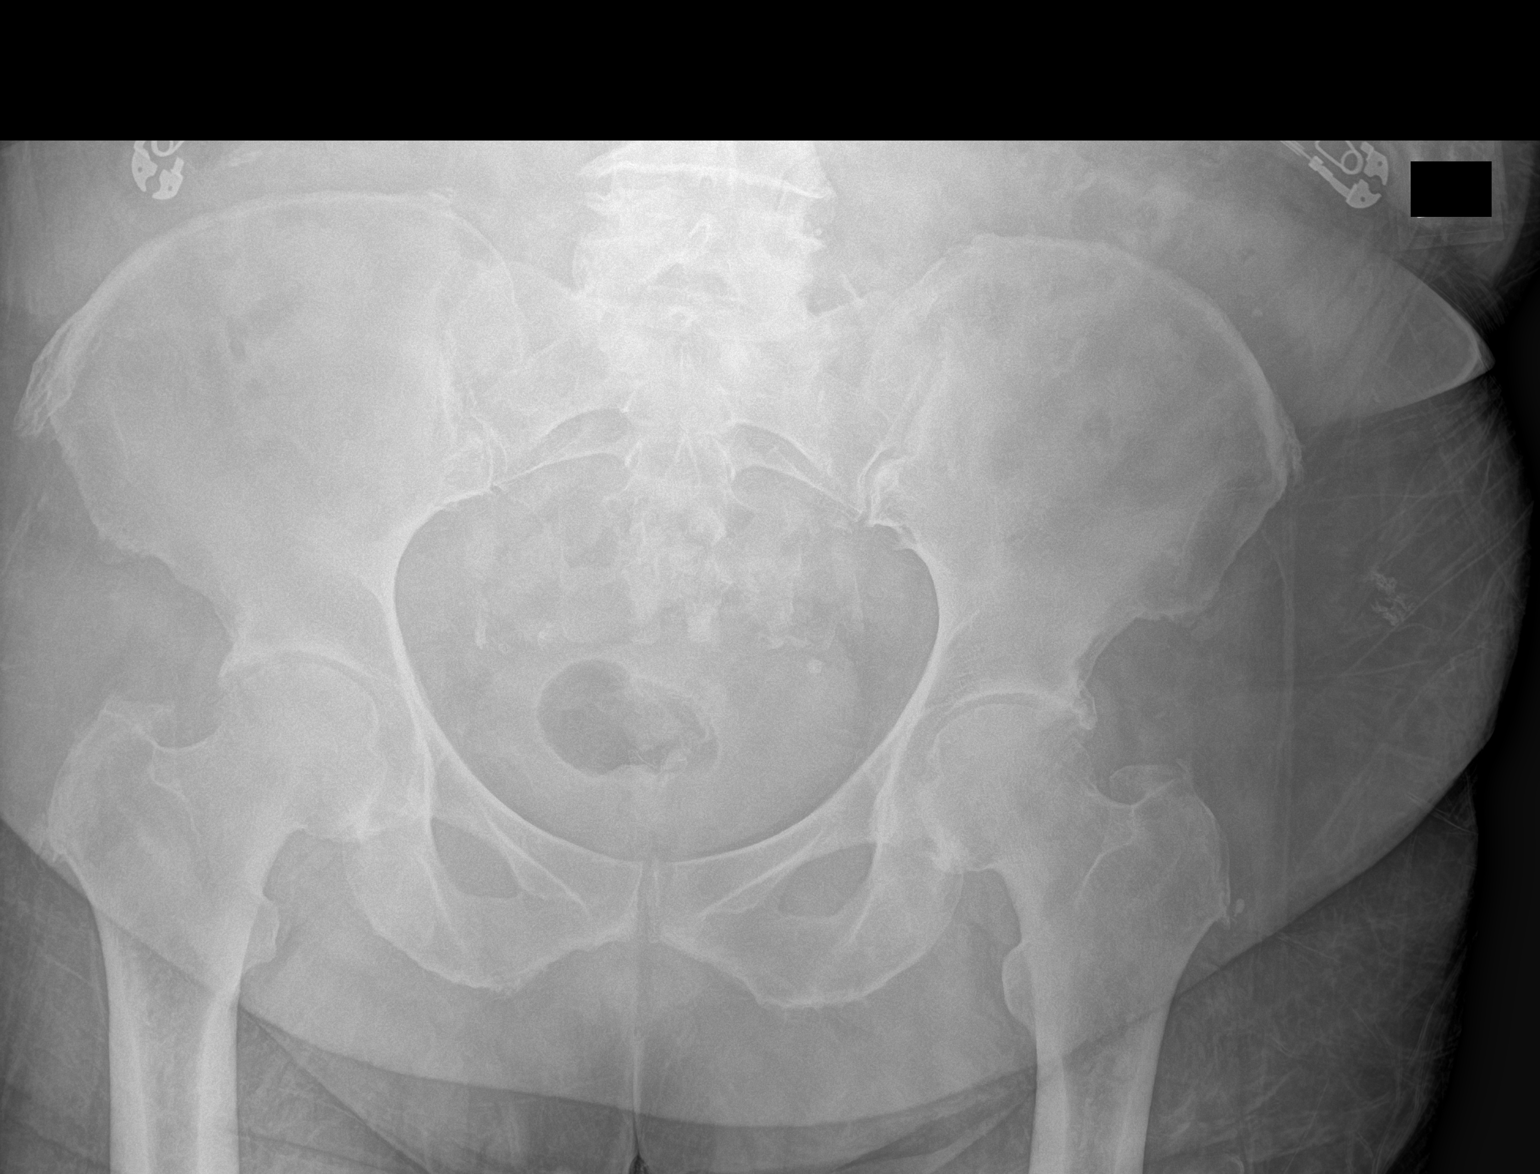

[1 of 1 positions shown; findings below may reference images not displayed]

FINDINGS: No acute fracture or dislocation. Mild spurring of the hips
bilaterally. Lower lumbar degenerative changes partially visualized.
IMPRESSION: No acute fracture or dislocation of the pelvis.

## 2020-12-07 IMAGING — CT CT HEAD W/O CM
4 series · 15 of 47 positions shown, 17 images · non-contrast
Comparison: Brain MRI [DATE]. Head CT [DATE]. Cervical
spine MRI [DATE].

CLINICAL DATA: Head trauma, coagulopathy. Neck trauma and head
trauma due to fall today. Neck trauma, dangerous injury mechanism.

EXAM:
CT HEAD WITHOUT CONTRAST
CT CERVICAL SPINE WITHOUT CONTRAST
TECHNIQUE: Multidetector CT imaging of the head and cervical spine was
performed following the standard protocol without intravenous
contrast. Multiplanar CT image reconstructions of the cervical spine
were also generated.

[Series 3: head bone · axial · 0.43mm/px · z∈[+1156,+1174]mm · 2 of 88 slices shown]
[im 9/88  bone]
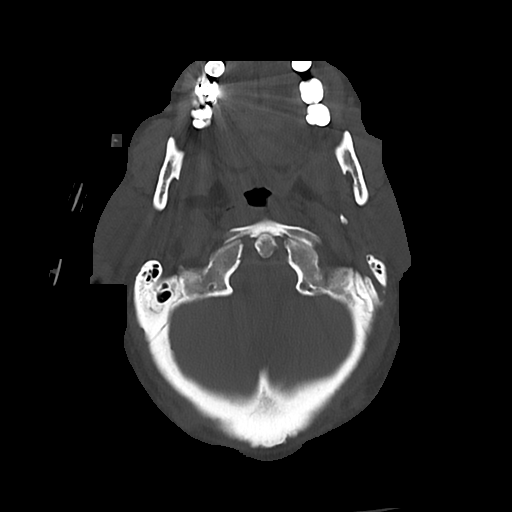
[im 18/88  bone]
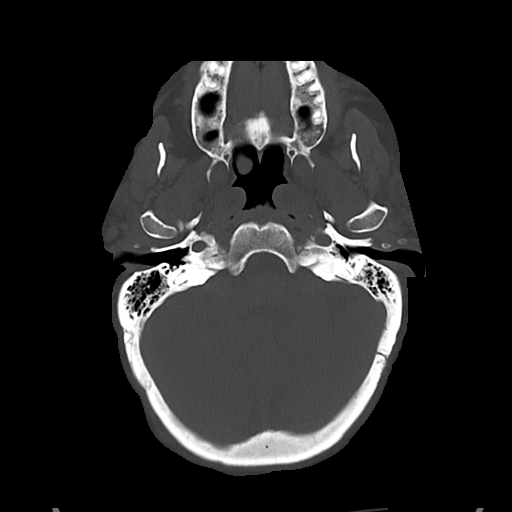

[Series 4: head wo · axial · 0.43mm/px · z∈[+1160,+1290]mm · 7 of 36 slices shown, 9 images]
[im 5/36  brain]
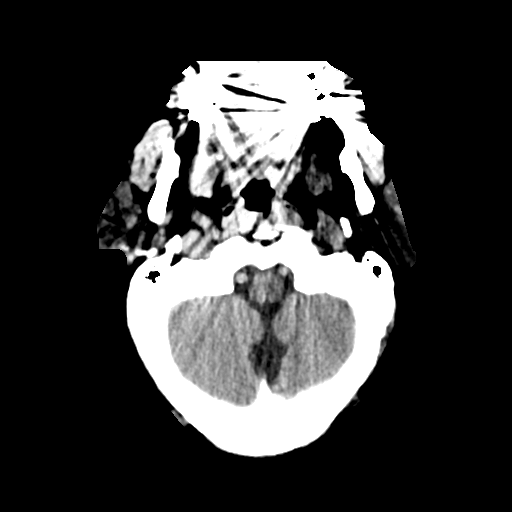
[im 5/36  bone]
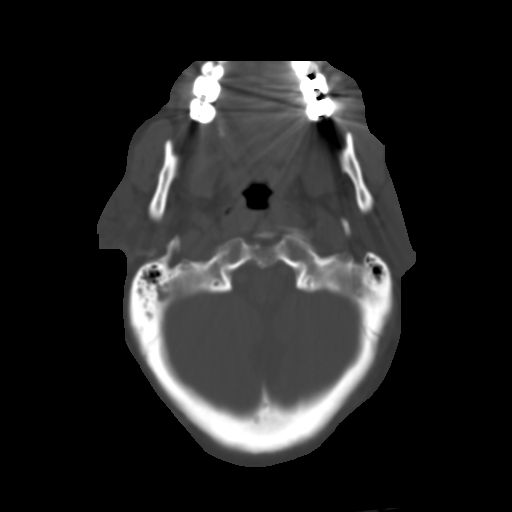
[im 9/36  brain]
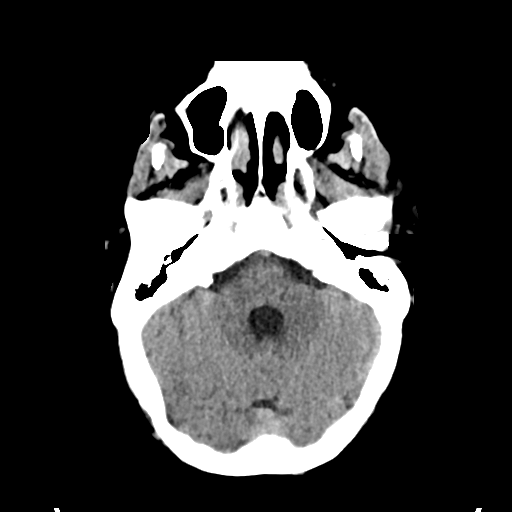
[im 14/36  brain]
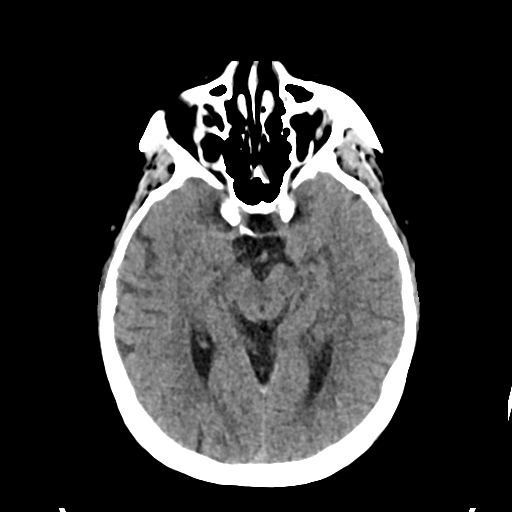
[im 18/36  brain]
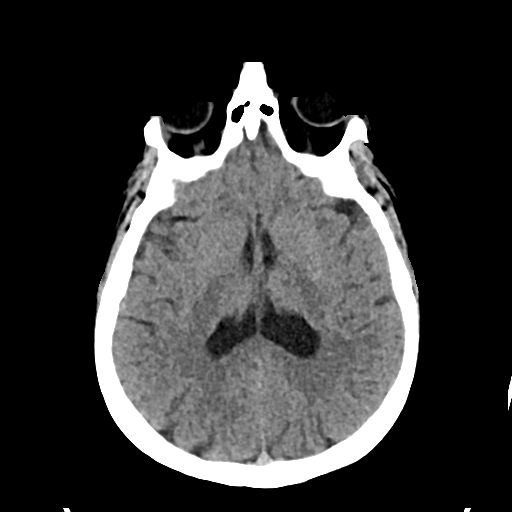
[im 22/36  brain]
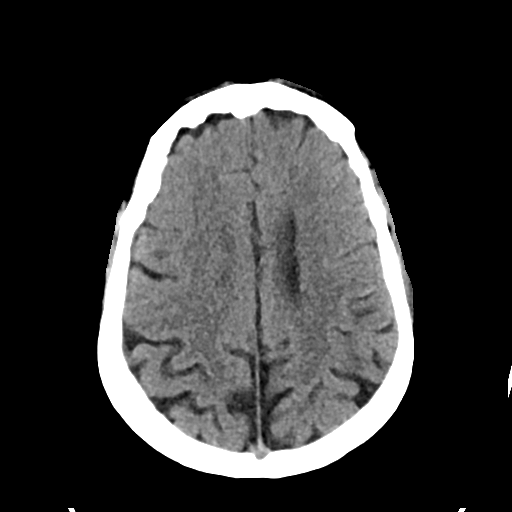
[im 22/36  bone]
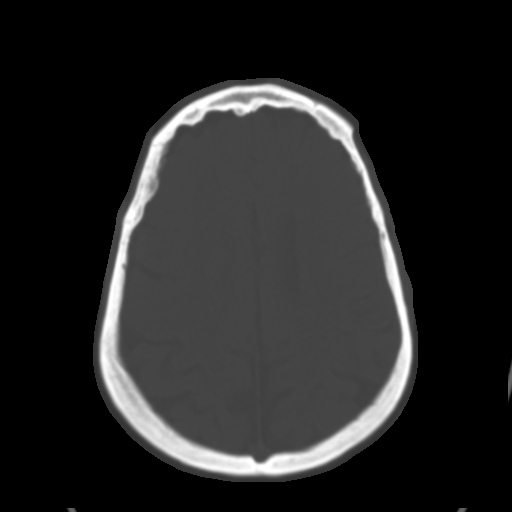
[im 27/36  brain]
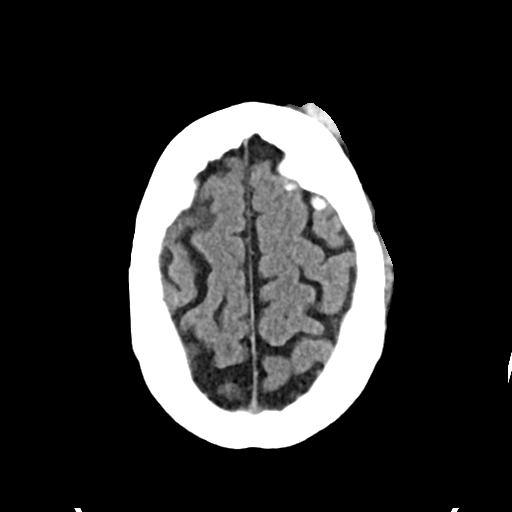
[im 31/36  brain]
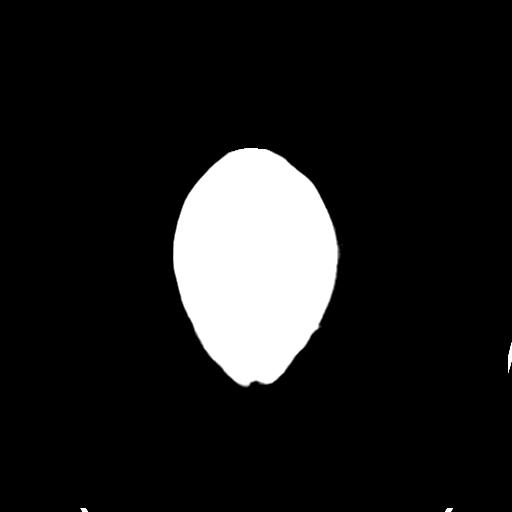

[Series 5: cor soft · coronal · 0.34mm/px · 3 of 71 slices shown]
[im 24/71  brain]
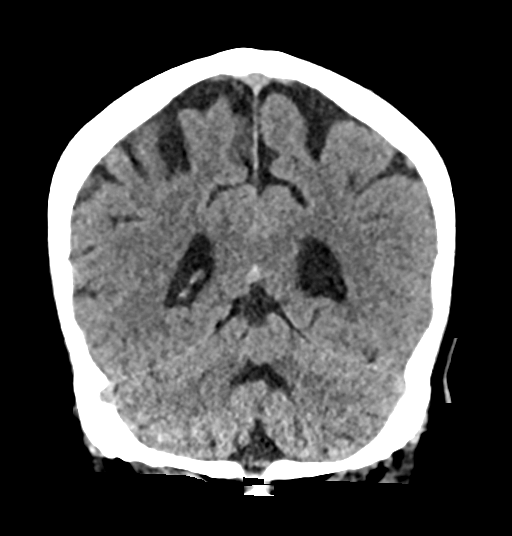
[im 32/71  brain]
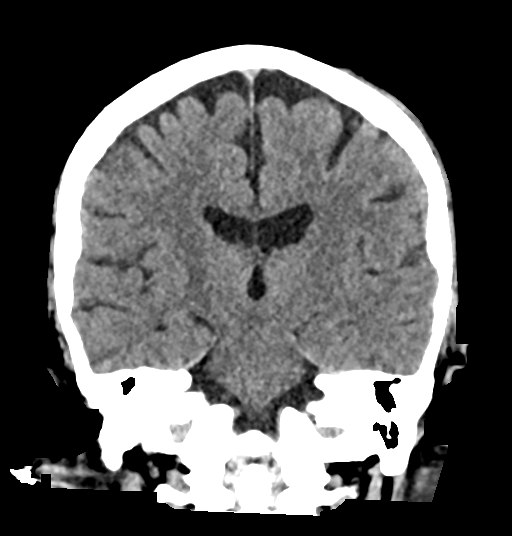
[im 39/71  brain]
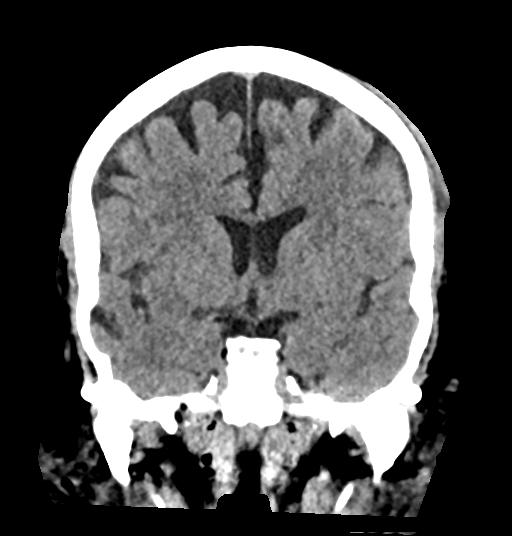

[Series 6: sag soft · sagittal · 0.35mm/px · 3 of 54 slices shown]
[im 18/54  brain]
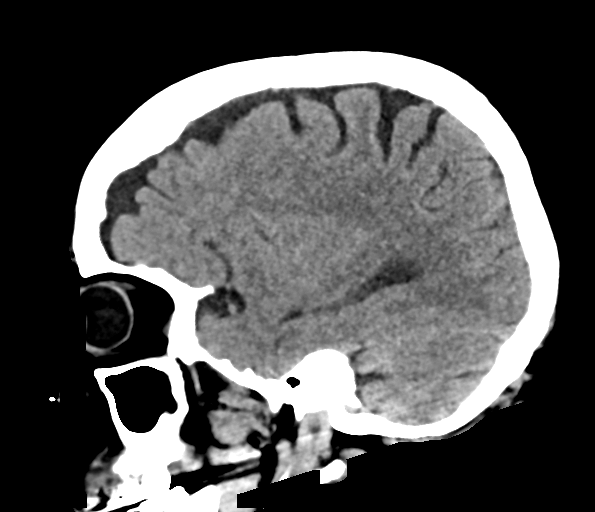
[im 27/54  brain]
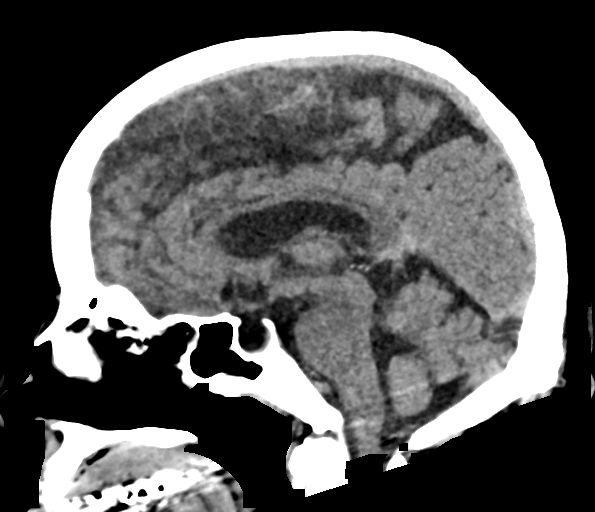
[im 36/54  brain]
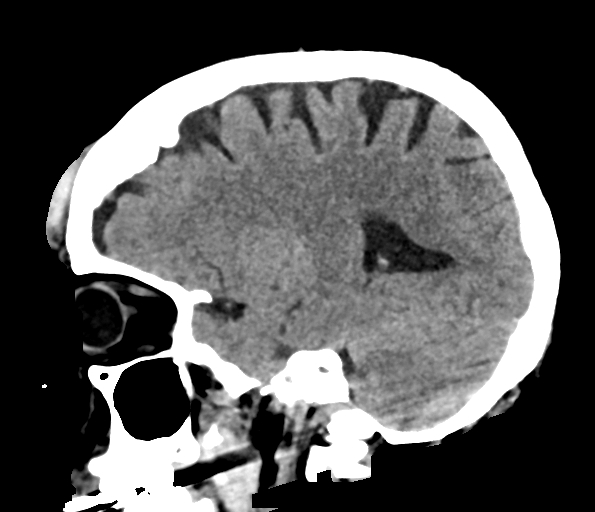

[15 of 47 positions shown; findings below may reference images not displayed]

FINDINGS: CT HEAD FINDINGS

Brain:

There is no acute intracranial hemorrhage.

No demarcated cortical infarct.

No extra-axial fluid collection.

No evidence of intracranial mass.

No midline shift.

Partially empty sella turcica.

Vascular: No hyperdense vessel.  Atherosclerotic calcifications.

Skull: Normal. Negative for fracture or focal lesion.

Sinuses/Orbits: No acute finding within the orbits. Trace bilateral
ethmoid sinus mucosal thickening.

Other: Left temporoparietal and frontal scalp hematomas. The left
frontal scalp hematoma extends to the left periorbital region.

CT CERVICAL SPINE FINDINGS

Alignment: Straightening of the expected cervical lordosis. No
significant spondylolisthesis.

Skull base and vertebrae: The basion-dental and atlanto-dental
intervals are maintained.No evidence of acute fracture to the
cervical spine.

Soft tissues and spinal canal: No prevertebral fluid or swelling. No
visible canal hematoma.

Disc levels: Cervical spondylosis with multilevel disc space
narrowing, disc bulges, central disc protrusions, posterior disc
osteophytes, uncovertebral hypertrophy and facet arthrosis. Moderate
disc space narrowing at the C3-C4 through C7-T1 levels. Multilevel
ventrolateral osteophytes. Multilevel spinal canal stenosis. Most
notably, a posterior disc osteophyte complex contributes to severe
spinal canal stenosis at C4-C5. Also of note, a partially calcified
left center disc protrusion contributes to at least moderate spinal
canal stenosis at C3-C4. Multilevel bony neural foraminal narrowing.

Upper chest: No consolidation within the imaged lung apices. No
visible pneumothorax.

Other: Calcified atherosclerotic plaque within the visualized aortic
arch, proximal major branch vessels of the neck and carotid
arteries.
IMPRESSION: CT head:

1. No evidence of acute intracranial abnormality.
2. Left temporoparietal and frontal scalp hematomas. The left
frontal scalp hematoma extends to the left periorbital region.

CT cervical spine:

1. No evidence of acute fracture to the cervical spine.
2. Nonspecific straightening of the expected cervical lordosis.
3. Age advanced cervical spondylosis as described. Most notably at
C4-C5, there is multifactorial severe spinal canal stenosis. Also of
note at C3-C4, a partially calcified left center disc protrusion
contributes to at least moderate spinal canal stenosis.
4. Aortic Atherosclerosis ([WW]-[WW]). Calcified plaque is also
present within the proximal major branch vessels of the neck and
carotid arteries.

## 2020-12-07 IMAGING — CT CT CERVICAL SPINE W/O CM
3 of 4 series · 10 of 33 positions shown, 12 images · non-contrast
Comparison: Brain MRI [DATE]. Head CT [DATE]. Cervical
spine MRI [DATE].

CLINICAL DATA: Head trauma, coagulopathy. Neck trauma and head
trauma due to fall today. Neck trauma, dangerous injury mechanism.

EXAM:
CT HEAD WITHOUT CONTRAST
CT CERVICAL SPINE WITHOUT CONTRAST
TECHNIQUE: Multidetector CT imaging of the head and cervical spine was
performed following the standard protocol without intravenous
contrast. Multiplanar CT image reconstructions of the cervical spine
were also generated.

[Series 8: sag bone · sagittal · 0.29mm/px · 5 of 73 slices shown, 6 images]
[im 25/73  bone]
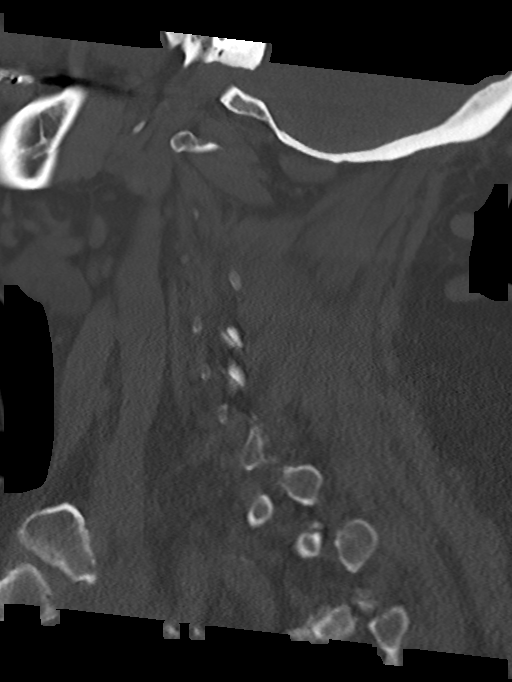
[im 31/73  bone]
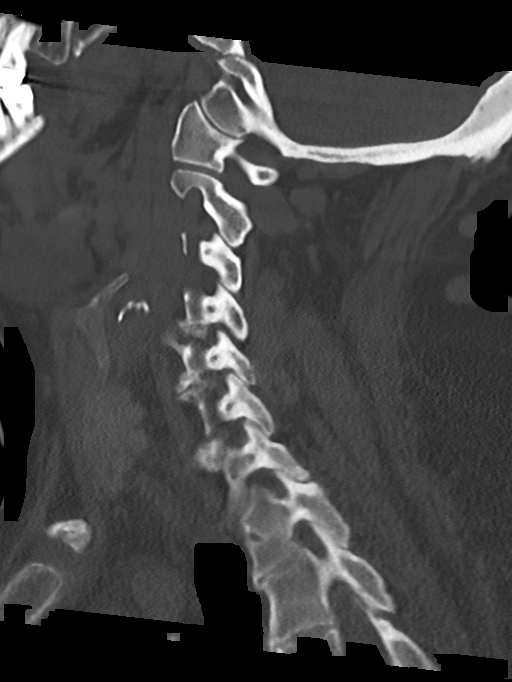
[im 37/73  soft-tissue]
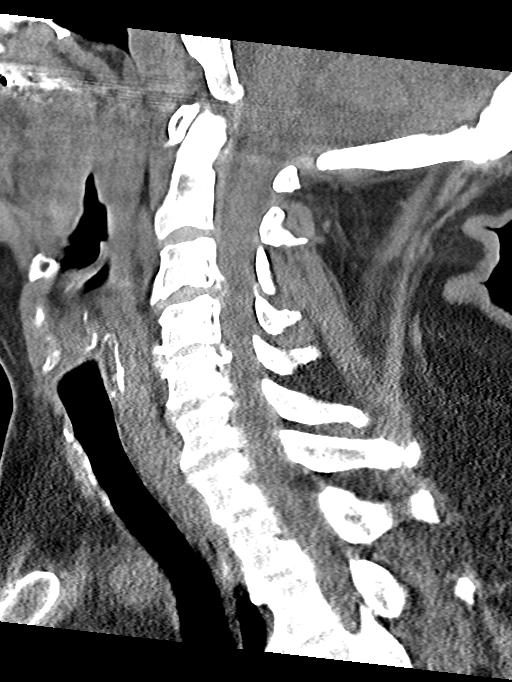
[im 37/73  bone]
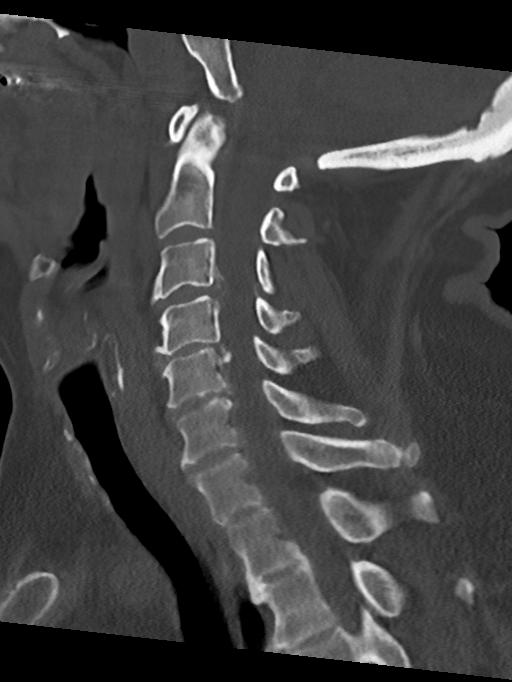
[im 43/73  bone]
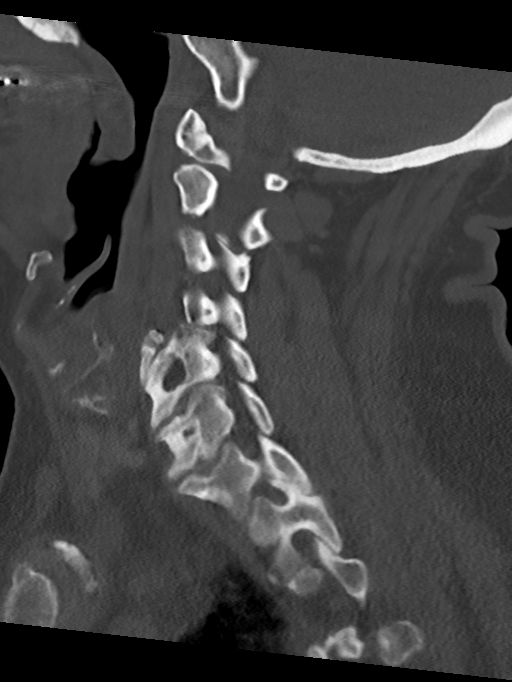
[im 49/73  bone]
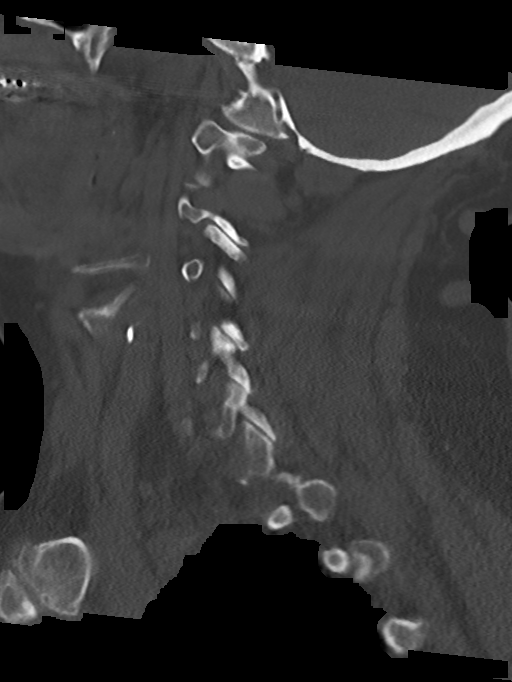

[Series 9: cor bone · coronal · 0.37mm/px · 3 of 91 slices shown]
[im 19/91  bone]
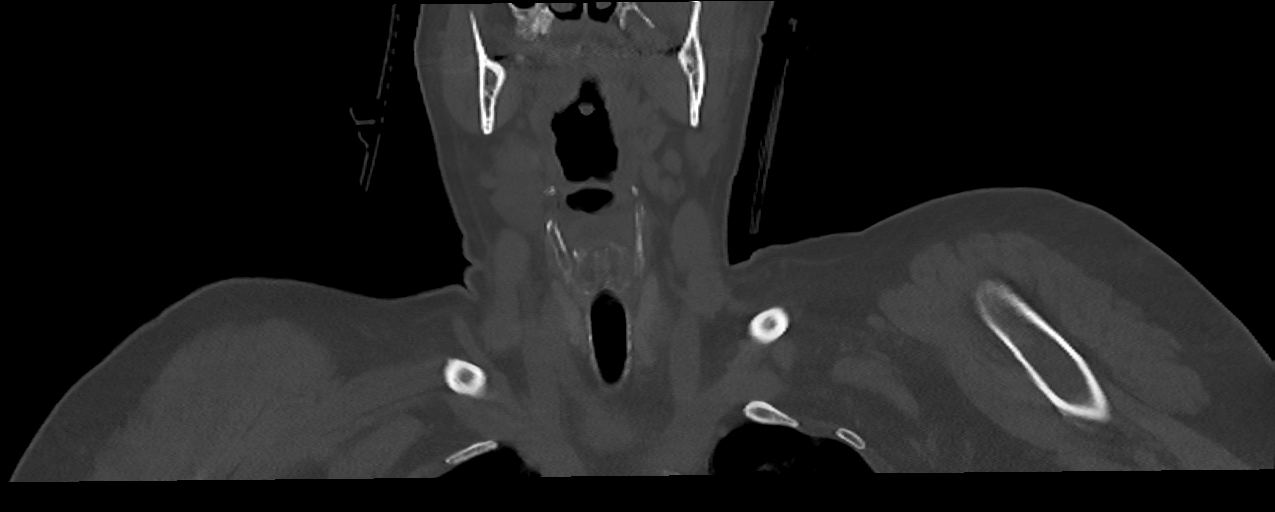
[im 37/91  bone]
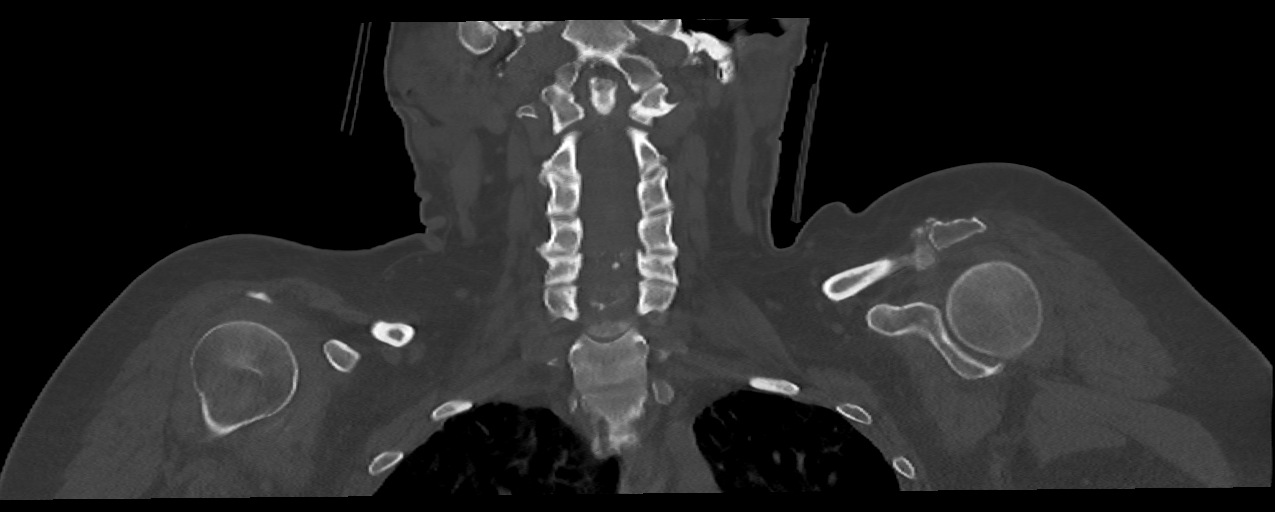
[im 55/91  bone]
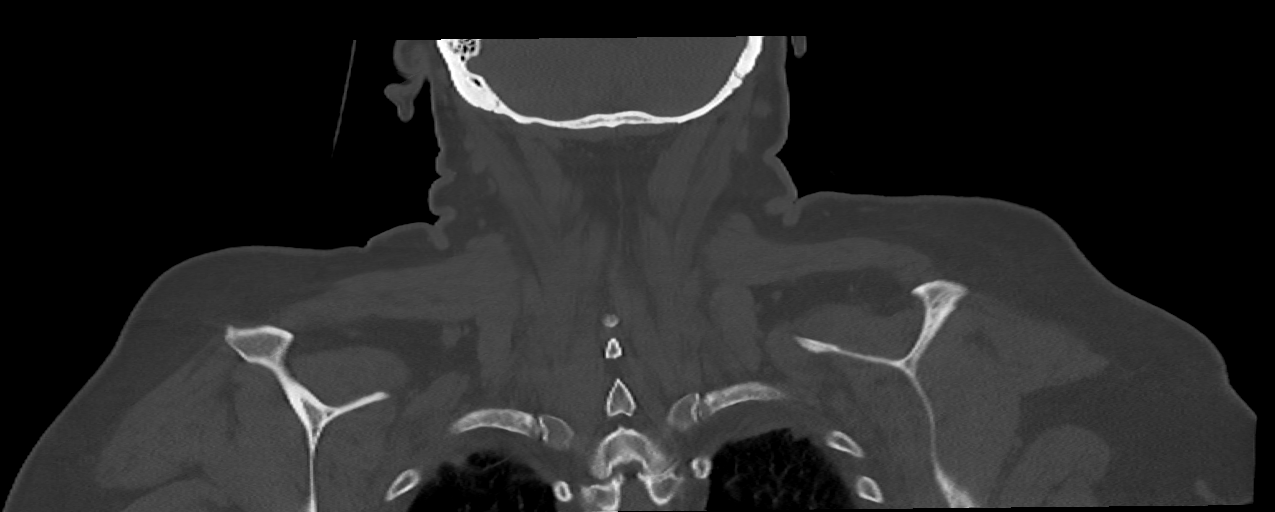

[Series 10: orthogonal axials · axial · 0.21mm/px · z∈[+1043,+1105]mm · 2 of 91 slices shown, 3 images]
[im 31/91  soft-tissue]
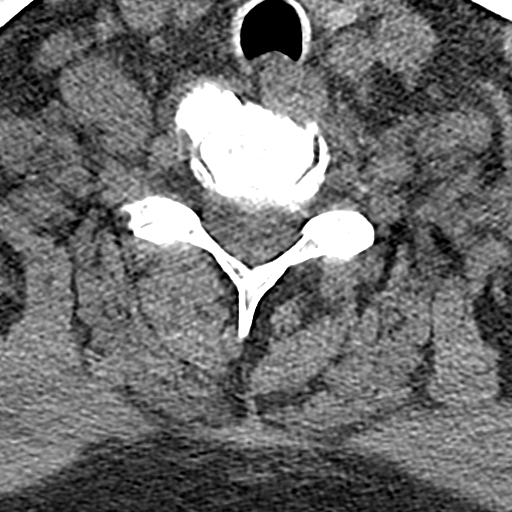
[im 31/91  bone]
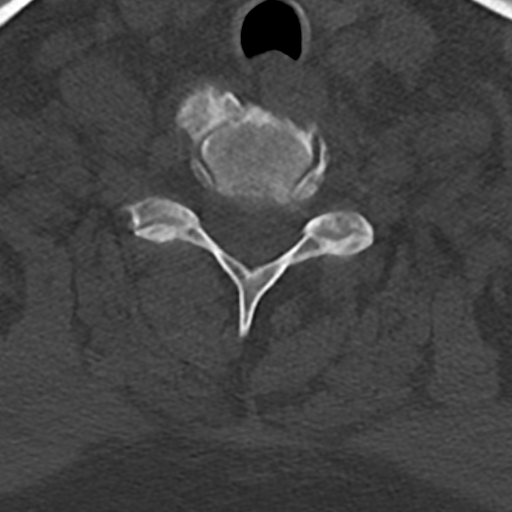
[im 61/91  bone]
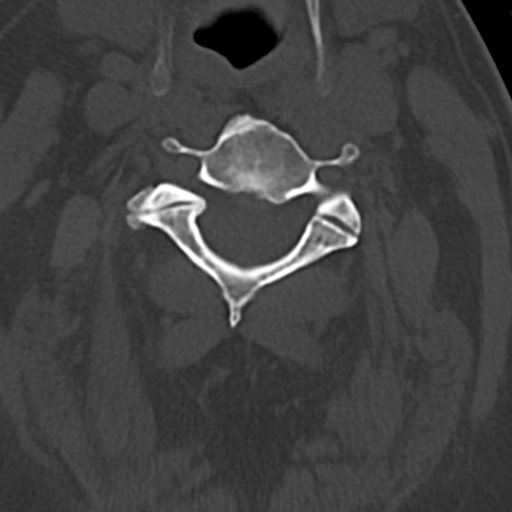

[10 of 33 positions shown; findings below may reference images not displayed]

FINDINGS: CT HEAD FINDINGS

Brain:

There is no acute intracranial hemorrhage.

No demarcated cortical infarct.

No extra-axial fluid collection.

No evidence of intracranial mass.

No midline shift.

Partially empty sella turcica.

Vascular: No hyperdense vessel.  Atherosclerotic calcifications.

Skull: Normal. Negative for fracture or focal lesion.

Sinuses/Orbits: No acute finding within the orbits. Trace bilateral
ethmoid sinus mucosal thickening.

Other: Left temporoparietal and frontal scalp hematomas. The left
frontal scalp hematoma extends to the left periorbital region.

CT CERVICAL SPINE FINDINGS

Alignment: Straightening of the expected cervical lordosis. No
significant spondylolisthesis.

Skull base and vertebrae: The basion-dental and atlanto-dental
intervals are maintained.No evidence of acute fracture to the
cervical spine.

Soft tissues and spinal canal: No prevertebral fluid or swelling. No
visible canal hematoma.

Disc levels: Cervical spondylosis with multilevel disc space
narrowing, disc bulges, central disc protrusions, posterior disc
osteophytes, uncovertebral hypertrophy and facet arthrosis. Moderate
disc space narrowing at the C3-C4 through C7-T1 levels. Multilevel
ventrolateral osteophytes. Multilevel spinal canal stenosis. Most
notably, a posterior disc osteophyte complex contributes to severe
spinal canal stenosis at C4-C5. Also of note, a partially calcified
left center disc protrusion contributes to at least moderate spinal
canal stenosis at C3-C4. Multilevel bony neural foraminal narrowing.

Upper chest: No consolidation within the imaged lung apices. No
visible pneumothorax.

Other: Calcified atherosclerotic plaque within the visualized aortic
arch, proximal major branch vessels of the neck and carotid
arteries.
IMPRESSION: CT head:

1. No evidence of acute intracranial abnormality.
2. Left temporoparietal and frontal scalp hematomas. The left
frontal scalp hematoma extends to the left periorbital region.

CT cervical spine:

1. No evidence of acute fracture to the cervical spine.
2. Nonspecific straightening of the expected cervical lordosis.
3. Age advanced cervical spondylosis as described. Most notably at
C4-C5, there is multifactorial severe spinal canal stenosis. Also of
note at C3-C4, a partially calcified left center disc protrusion
contributes to at least moderate spinal canal stenosis.
4. Aortic Atherosclerosis ([WW]-[WW]). Calcified plaque is also
present within the proximal major branch vessels of the neck and
carotid arteries.

## 2020-12-07 IMAGING — DX DG CHEST 1V PORT
1 series · 1 of 1 positions shown · non-contrast
Comparison: [DATE]

CLINICAL DATA: Trauma

Fall
EXAM:
PORTABLE CHEST 1 VIEW

[chest ap]
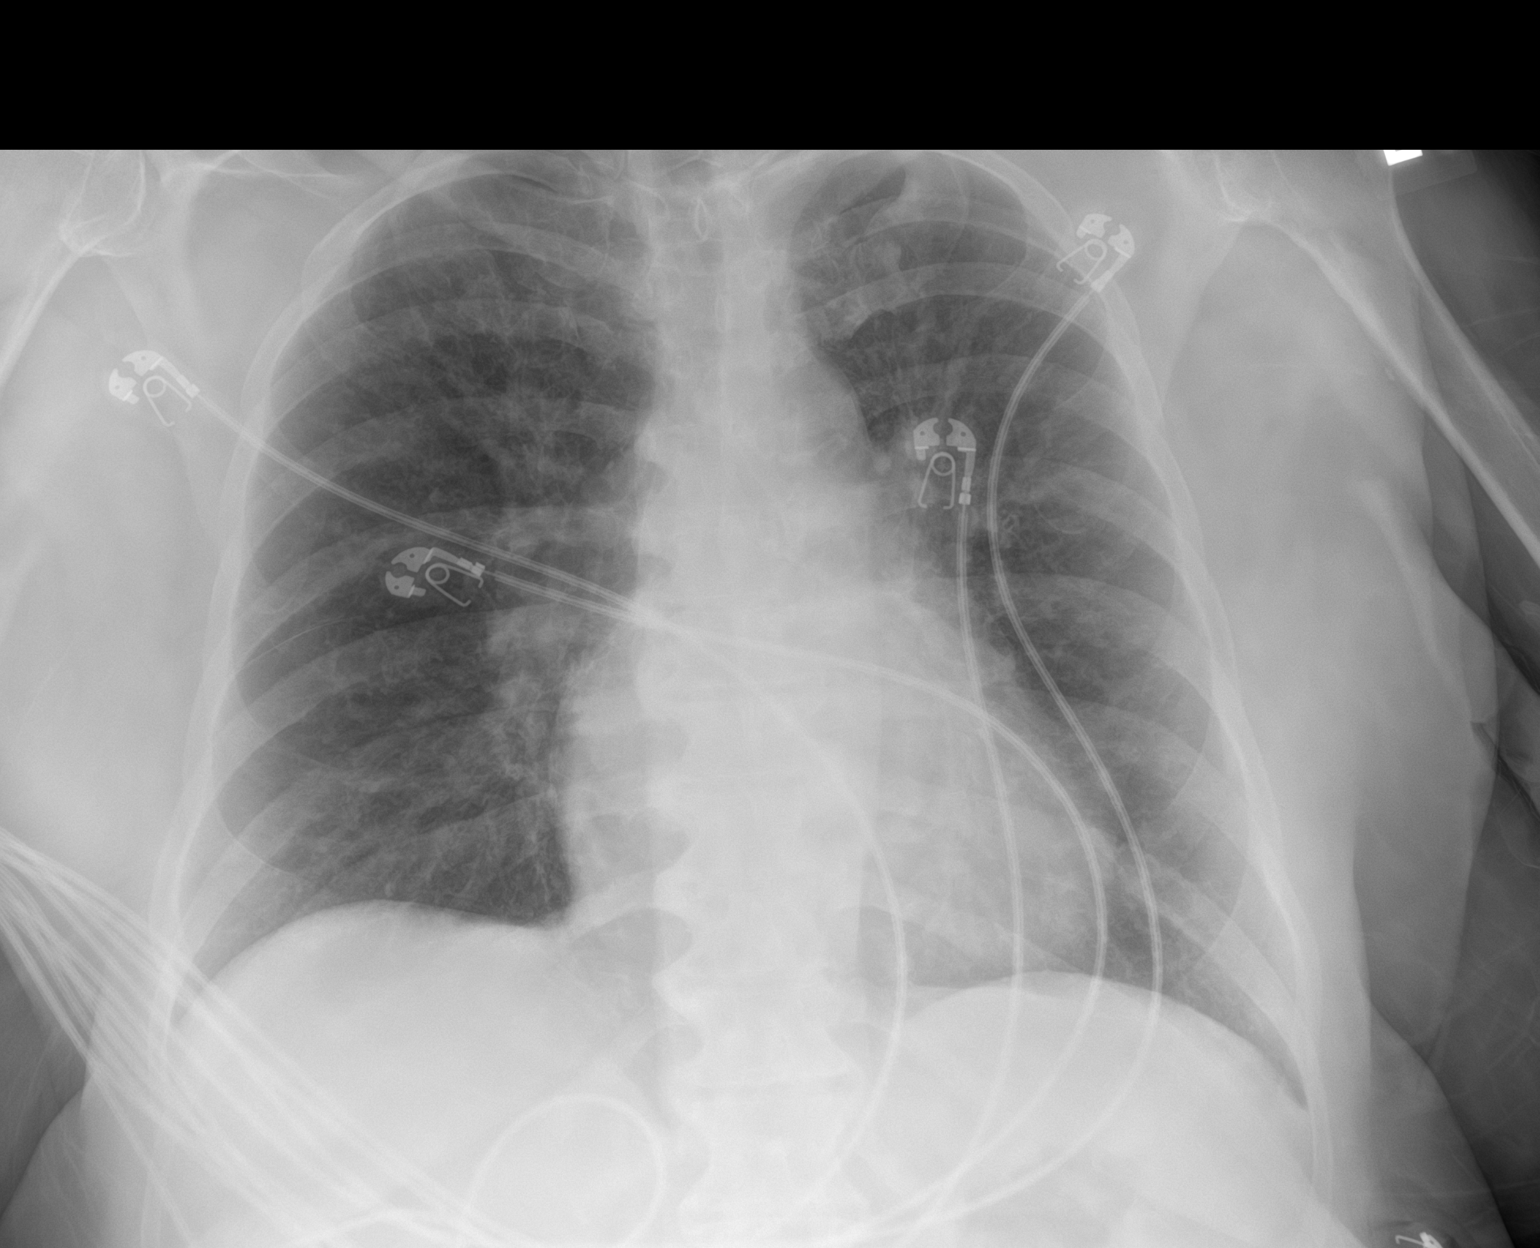

[1 of 1 positions shown; findings below may reference images not displayed]

FINDINGS: The heart size and mediastinal contours are within normal limits.
Both lungs are clear. The visualized skeletal structures are
unremarkable.
IMPRESSION: No acute cardiopulmonary process.

## 2020-12-07 IMAGING — DX DG FOOT COMPLETE 3+V*R*
3 series · 3 of 3 positions shown · non-contrast
Comparison: [DATE]

CLINICAL DATA: Fall

EXAM:
RIGHT FOOT COMPLETE - 3+ VIEW

[foot ap]
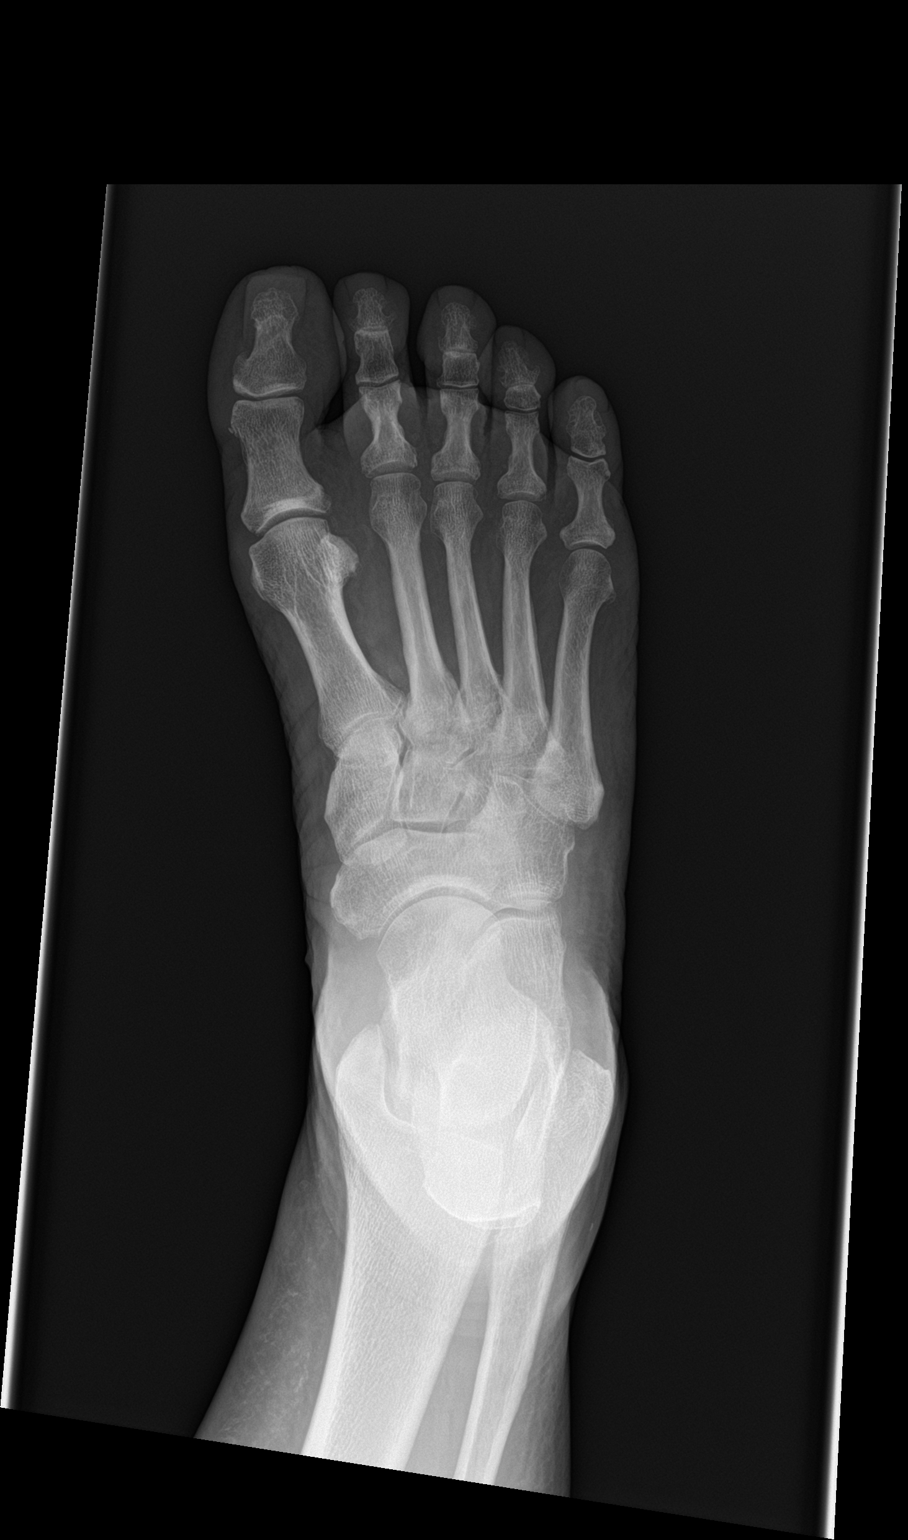

[foot obl]
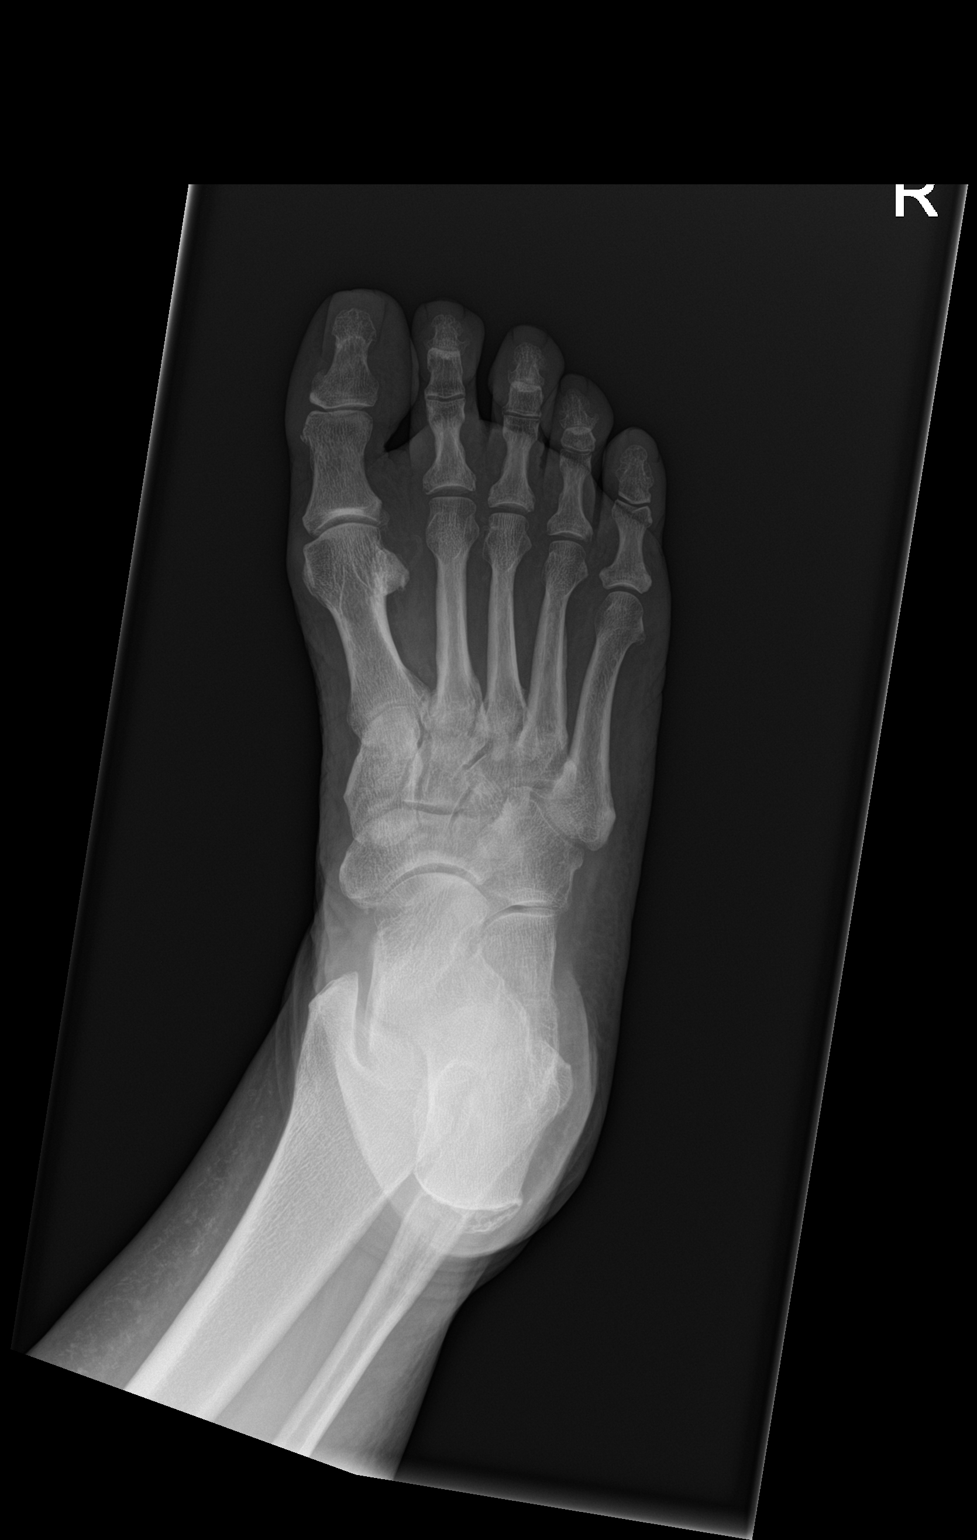

[foot lat]
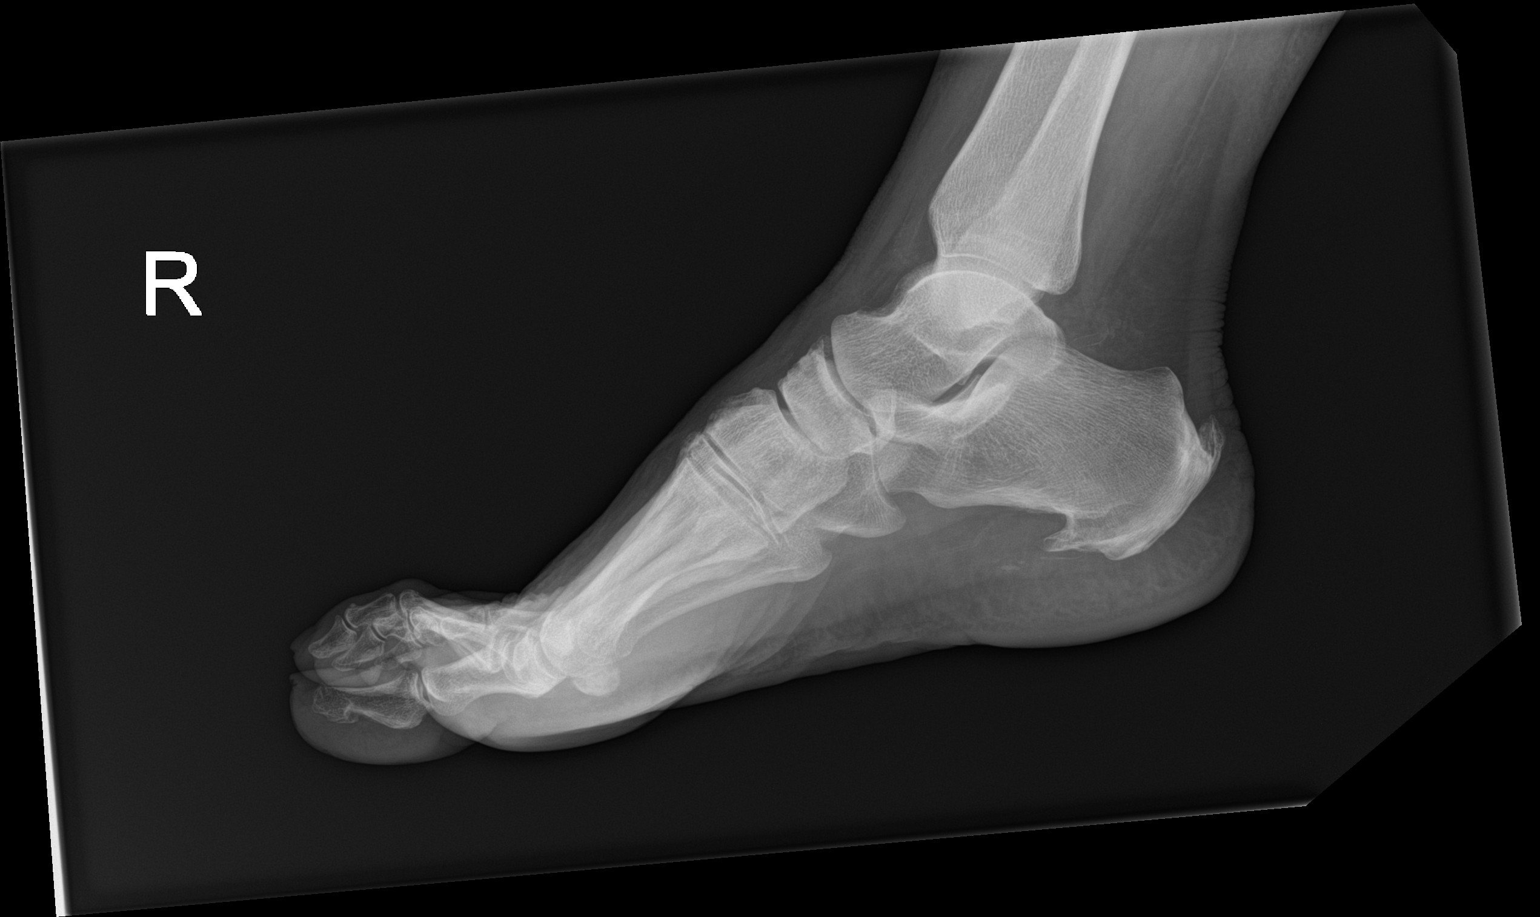

[3 of 3 positions shown; findings below may reference images not displayed]

FINDINGS: Negative for fracture. No significant arthropathy. Prominent
calcaneal spurring at the Achilles tendon and plantar fascia
insertion.
IMPRESSION: Negative for fracture.

Prominent calcaneal spurring

## 2020-12-07 MED ORDER — OXYCODONE-ACETAMINOPHEN 5-325 MG PO TABS: 1.0000 | ORAL_TABLET | ORAL | 0 refills | Status: DC | PRN

## 2020-12-07 MED ORDER — ONDANSETRON HCL 4 MG/2ML IJ SOLN
4.0000 mg | Freq: Once | INTRAMUSCULAR | Status: AC
  Administered 2020-12-07: 4 mg via INTRAVENOUS
  Filled 2020-12-07: qty 2

## 2020-12-07 MED ORDER — FENTANYL CITRATE (PF) 100 MCG/2ML IJ SOLN
50.0000 ug | Freq: Once | INTRAMUSCULAR | Status: AC
  Administered 2020-12-07: 50 ug via INTRAVENOUS
  Filled 2020-12-07: qty 2

## 2020-12-07 MED ORDER — OXYCODONE-ACETAMINOPHEN 5-325 MG PO TABS: 1.0000 | ORAL_TABLET | Freq: Four times a day (QID) | ORAL | 0 refills | Status: DC | PRN

## 2020-12-07 MED ORDER — HYDROMORPHONE HCL 1 MG/ML IJ SOLN
1.0000 mg | Freq: Once | INTRAMUSCULAR | Status: AC
  Administered 2020-12-07: 1 mg via INTRAVENOUS
  Filled 2020-12-07: qty 1

## 2020-12-07 NOTE — ED Notes (Signed)
PT states nausea and pain increasing. Pupils remain equal and reactive. MD notified.

## 2020-12-07 NOTE — Progress Notes (Signed)
Orthopedic Tech Progress Note Patient Details:  Laura [name] May 28, 1961 [id number] Level 2 trauma Patient ID: [name], female   DOB: 01-06-1961, 60 y.o.   MRN: [id number]   [name] 12/07/2020, 3:45 PM

## 2020-12-07 NOTE — Discharge Instructions (Signed)
1.  Schedule appointment for recheck with your doctor within 2 days. 2.  Follow instructions for head injury.  Return if you develop any concerning symptoms. 3.  Sleep with your head elevated about 30 degrees.  Apply well wrapped ice pack to all areas of bruising and swelling. 4.  You may take plain Tylenol for pain control.  If you need additional pain control you may take 1-2 Percocet every 6 hours.  Do not combine Percocet with Tylenol as each Percocet tablet contains 325 mg of Tylenol. 5.  You will have many areas of very large bruises and discoloration.  Try to apply ice and compress as much as possible.  Have a recheck with your doctor within 2 days, return to the emergency department if you are developing pain, confusion or other concerning symptoms.

## 2020-12-07 NOTE — ED Provider Notes (Signed)
[facility] EMERGENCY DEPARTMENT Provider Note   CSN: [id number] Arrival date & time: 12/07/20  1339     History Chief Complaint  Patient presents with  . fall on thinners    Laura [name] is a 60 y.o. female.  HPI  Patient was walking into the pharmacy.  She reports she tripped or slipped and fell forward.  She struck her forehead on the corner of the metal door.  From there she continued to fall forward and broke her fall somewhat with her hands but then fell onto her elbows knees and struck her head on the cement floor.  Patient denies she got knocked out.  She reports she does have a bad headache.  She takes Eliquis regularly.  She reports that she has pain in her right wrist, right elbow and both knees.  Patient reports she is got chronic problems with neck pain and is being seen by a specialist for that.  No new weakness numbness or tingling to extremities.    No past medical history on file.  There are no problems to display for this patient.      OB History   No obstetric history on file.     No family history on file.     Home Medications Prior to Admission medications   Medication Sig Start Date End Date Taking? Authorizing Provider  oxyCODONE-acetaminophen (PERCOCET) 5-325 MG tablet Take 1-2 tablets by mouth every 6 (six) hours as needed. 12/07/20   Charlesetta Shanks, MD    Allergies    Patient has no allergy information on record.  Review of Systems   Review of Systems 10 systems reviewed and negative except as per HPI Physical Exam Updated Vital Signs BP (!) 122/55   Pulse 71   Temp (!) 97.3 F (36.3 C) (Oral)   Resp 15   Ht 5\' 4"  (1.626 m)   Wt 92.5 kg   SpO2 96%   BMI 35.02 kg/m   Physical Exam Constitutional:      Comments: Alert.  GCS of 15.  No respiratory distress.  HENT:     Head:     Comments: Patient has a large 5 cm hematoma to the left forehead.  Superficial overlying abrasion with small amount of bleeding.   Patient has another ecchymotic hematoma in the scalp over the temporal area.    Nose: Nose normal.     Mouth/Throat:     Mouth: Mucous membranes are moist.     Pharynx: Oropharynx is clear.  Eyes:     Extraocular Movements: Extraocular movements intact.     Pupils: Pupils are equal, round, and reactive to light.  Neck:     Comments: Patient endorses tenderness to palpation over the C7-T1 area. Cardiovascular:     Rate and Rhythm: Normal rate and regular rhythm.  Pulmonary:     Comments: No respiratory stress.  Patient does have diffuse expiratory wheeze.  No focal chest wall tenderness. Abdominal:     General: There is no distension.     Palpations: Abdomen is soft.     Tenderness: There is no abdominal tenderness. There is no guarding.  Musculoskeletal:     Comments: Ecchymosis developing over ulnar side of right wrist.  No wrist deformity.  Range of motion intact.  Healthy ecchymosis over right elbow without any swelling at this time.  Intact range of motion the elbow.  Early ecchymoses over both knees.  No effusions.  No deformities.  Tenderness to the right foot.  No deformities.  Skin:    General: Skin is warm and dry.  Neurological:     General: No focal deficit present.     Mental Status: She is oriented to person, place, and time.     Cranial Nerves: No cranial nerve deficit.     Coordination: Coordination normal.  Psychiatric:        Mood and Affect: Mood normal.     ED Results / Procedures / Treatments   Labs (all labs ordered are listed, but only abnormal results are displayed) Labs Reviewed  BASIC METABOLIC PANEL - Abnormal; Notable for the following components:      Result Value   CO2 21 (*)    All other components within normal limits  RESP PANEL BY RT-PCR (FLU A&B, COVID) [id number]  PROTIME-INR  CBC  TYPE AND SCREEN  ABO/RH    EKG None  Radiology CT Head Wo Contrast  Result Date: 12/07/2020 CLINICAL DATA:  Head trauma, coagulopathy. Neck trauma and  head trauma due to fall today. Neck trauma, dangerous injury mechanism. EXAM: CT HEAD WITHOUT CONTRAST CT CERVICAL SPINE WITHOUT CONTRAST TECHNIQUE: Multidetector CT imaging of the head and cervical spine was performed following the standard protocol without intravenous contrast. Multiplanar CT image reconstructions of the cervical spine were also generated. COMPARISON:  Brain MRI 07/18/2018. Head CT 07/10/2018. Cervical spine MRI 11/19/2020. FINDINGS: CT HEAD FINDINGS Brain: There is no acute intracranial hemorrhage. No demarcated cortical infarct. No extra-axial fluid collection. No evidence of intracranial mass. No midline shift. Partially empty sella turcica. Vascular: No hyperdense vessel.  Atherosclerotic calcifications. Skull: Normal. Negative for fracture or focal lesion. Sinuses/Orbits: No acute finding within the orbits. Trace bilateral ethmoid sinus mucosal thickening. Other: Left temporoparietal and frontal scalp hematomas. The left frontal scalp hematoma extends to the left periorbital region. CT CERVICAL SPINE FINDINGS Alignment: Straightening of the expected cervical lordosis. No significant spondylolisthesis. Skull base and vertebrae: The basion-dental and atlanto-dental intervals are maintained.No evidence of acute fracture to the cervical spine. Soft tissues and spinal canal: No prevertebral fluid or swelling. No visible canal hematoma. Disc levels: Cervical spondylosis with multilevel disc space narrowing, disc bulges, central disc protrusions, posterior disc osteophytes, uncovertebral hypertrophy and facet arthrosis. Moderate disc space narrowing at the C3-C4 through C7-T1 levels. Multilevel ventrolateral osteophytes. Multilevel spinal canal stenosis. Most notably, a posterior disc osteophyte complex contributes to severe spinal canal stenosis at C4-C5. Also of note, a partially calcified left center disc protrusion contributes to at least moderate spinal canal stenosis at C3-C4. Multilevel bony  neural foraminal narrowing. Upper chest: No consolidation within the imaged lung apices. No visible pneumothorax. Other: Calcified atherosclerotic plaque within the visualized aortic arch, proximal major branch vessels of the neck and carotid arteries. IMPRESSION: CT head: 1. No evidence of acute intracranial abnormality. 2. Left temporoparietal and frontal scalp hematomas. The left frontal scalp hematoma extends to the left periorbital region. CT cervical spine: 1. No evidence of acute fracture to the cervical spine. 2. Nonspecific straightening of the expected cervical lordosis. 3. Age advanced cervical spondylosis as described. Most notably at C4-C5, there is multifactorial severe spinal canal stenosis. Also of note at C3-C4, a partially calcified left center disc protrusion contributes to at least moderate spinal canal stenosis. 4. Aortic Atherosclerosis (ICD10-I70.0). Calcified plaque is also present within the proximal major branch vessels of the neck and carotid arteries. Electronically Signed   By: Kellie Simmering DO   On: 12/07/2020 14:42   CT Cervical Spine Wo Contrast  Result  Date: 12/07/2020 CLINICAL DATA:  Head trauma, coagulopathy. Neck trauma and head trauma due to fall today. Neck trauma, dangerous injury mechanism. EXAM: CT HEAD WITHOUT CONTRAST CT CERVICAL SPINE WITHOUT CONTRAST TECHNIQUE: Multidetector CT imaging of the head and cervical spine was performed following the standard protocol without intravenous contrast. Multiplanar CT image reconstructions of the cervical spine were also generated. COMPARISON:  Brain MRI 07/18/2018. Head CT 07/10/2018. Cervical spine MRI 11/19/2020. FINDINGS: CT HEAD FINDINGS Brain: There is no acute intracranial hemorrhage. No demarcated cortical infarct. No extra-axial fluid collection. No evidence of intracranial mass. No midline shift. Partially empty sella turcica. Vascular: No hyperdense vessel.  Atherosclerotic calcifications. Skull: Normal. Negative for  fracture or focal lesion. Sinuses/Orbits: No acute finding within the orbits. Trace bilateral ethmoid sinus mucosal thickening. Other: Left temporoparietal and frontal scalp hematomas. The left frontal scalp hematoma extends to the left periorbital region. CT CERVICAL SPINE FINDINGS Alignment: Straightening of the expected cervical lordosis. No significant spondylolisthesis. Skull base and vertebrae: The basion-dental and atlanto-dental intervals are maintained.No evidence of acute fracture to the cervical spine. Soft tissues and spinal canal: No prevertebral fluid or swelling. No visible canal hematoma. Disc levels: Cervical spondylosis with multilevel disc space narrowing, disc bulges, central disc protrusions, posterior disc osteophytes, uncovertebral hypertrophy and facet arthrosis. Moderate disc space narrowing at the C3-C4 through C7-T1 levels. Multilevel ventrolateral osteophytes. Multilevel spinal canal stenosis. Most notably, a posterior disc osteophyte complex contributes to severe spinal canal stenosis at C4-C5. Also of note, a partially calcified left center disc protrusion contributes to at least moderate spinal canal stenosis at C3-C4. Multilevel bony neural foraminal narrowing. Upper chest: No consolidation within the imaged lung apices. No visible pneumothorax. Other: Calcified atherosclerotic plaque within the visualized aortic arch, proximal major branch vessels of the neck and carotid arteries. IMPRESSION: CT head: 1. No evidence of acute intracranial abnormality. 2. Left temporoparietal and frontal scalp hematomas. The left frontal scalp hematoma extends to the left periorbital region. CT cervical spine: 1. No evidence of acute fracture to the cervical spine. 2. Nonspecific straightening of the expected cervical lordosis. 3. Age advanced cervical spondylosis as described. Most notably at C4-C5, there is multifactorial severe spinal canal stenosis. Also of note at C3-C4, a partially calcified left  center disc protrusion contributes to at least moderate spinal canal stenosis. 4. Aortic Atherosclerosis (ICD10-I70.0). Calcified plaque is also present within the proximal major branch vessels of the neck and carotid arteries. Electronically Signed   By: Kellie Simmering DO   On: 12/07/2020 14:42   DG Pelvis Portable  Result Date: 12/07/2020 CLINICAL DATA:  Trauma Fall Hip pain EXAM: PORTABLE PELVIS 1-2 VIEWS COMPARISON:  None. FINDINGS: No acute fracture or dislocation. Mild spurring of the hips bilaterally. Lower lumbar degenerative changes partially visualized. IMPRESSION: No acute fracture or dislocation of the pelvis. Electronically Signed   By: Miachel Roux M.D.   On: 12/07/2020 14:10   DG Chest Port 1 View  Result Date: 12/07/2020 CLINICAL DATA:  Trauma Fall EXAM: PORTABLE CHEST 1 VIEW COMPARISON:  07/03/2020 FINDINGS: The heart size and mediastinal contours are within normal limits. Both lungs are clear. The visualized skeletal structures are unremarkable. IMPRESSION: No acute cardiopulmonary process. Electronically Signed   By: Miachel Roux M.D.   On: 12/07/2020 14:11   DG Foot Complete Right  Result Date: 12/07/2020 CLINICAL DATA:  Fall EXAM: RIGHT FOOT COMPLETE - 3+ VIEW COMPARISON:  06/23/2020 FINDINGS: Negative for fracture. No significant arthropathy. Prominent calcaneal spurring at the Achilles tendon and  plantar fascia insertion. IMPRESSION: Negative for fracture. Prominent calcaneal spurring Electronically Signed   By: Franchot Gallo M.D.   On: 12/07/2020 15:34    Procedures Procedures   Medications Ordered in ED Medications  HYDROmorphone (DILAUDID) injection 1 mg (1 mg Intravenous Given 12/07/20 1403)  ondansetron (ZOFRAN) injection 4 mg (4 mg Intravenous Given 12/07/20 1413)  ondansetron (ZOFRAN) injection 4 mg (4 mg Intravenous Given 12/07/20 1534)  fentaNYL (SUBLIMAZE) injection 50 mcg (50 mcg Intravenous Given 12/07/20 1537)    ED Course  I have reviewed the triage vital  signs and the nursing notes.  Pertinent labs & imaging results that were available during my care of the patient were reviewed by me and considered in my medical decision making (see chart for details).    MDM Rules/Calculators/A&P                          Patient presents with a fall on blood thinners.  He has very large hematoma to the left forehead.  CT head does not show any intracranial bleeding.  Patient's mental status is normal.  She did not have loss of consciousness.  Patient also had some posterior cervical pain.  No neurologic dysfunction.  She does report some chronic problems with neck pain being seen by neurosurgery.  No acute findings.  Patient does have multiple areas of bruising on the right wrist right elbow and knees.  No bony deformities or effusions or large swellings at this time.  Patient is on Eliquis and anticipate there will be extensive hematoma development.  Patient is counseled on compression of forehead hematoma and icing.  She is also counseled on icing for other areas of pain.  Patient is provided with Percocet to take as needed for pain over the next 2 to 3 days.  Patient is advised she needs a recheck within 48 hours.  Careful return precautions reviewed.  Patient does live with her sister and will be observed.  She voices understanding.  We will plan for 3 days off work to establish follow-up before returning to work. Final Clinical Impression(s) / ED Diagnoses Final diagnoses:  Traumatic hematoma of forehead, initial encounter  Anticoagulated  Multiple contusions    Rx / DC Orders ED Discharge Orders         Ordered    oxyCODONE-acetaminophen (PERCOCET) 5-325 MG tablet  Every 4 hours PRN,   Status:  Discontinued        12/07/20 1620    oxyCODONE-acetaminophen (PERCOCET) 5-325 MG tablet  Every 6 hours PRN        12/07/20 1621           Charlesetta Shanks, MD 12/07/20 [phone]

## 2020-12-07 NOTE — ED Notes (Signed)
Pt tripped and hit head on door at [facility] -- large hematoma to left side of forehead, also c/o right knee pain. Lengthy medical history

## 2020-12-07 NOTE — ED Notes (Signed)
PT back from CT

## 2020-12-08 ENCOUNTER — Encounter: Payer: Self-pay | Admitting: Gastroenterology

## 2020-12-08 ENCOUNTER — Other Ambulatory Visit: Payer: Self-pay

## 2020-12-08 ENCOUNTER — Telehealth: Payer: Self-pay

## 2020-12-08 ENCOUNTER — Ambulatory Visit (HOSPITAL_COMMUNITY): Payer: Medicare Other | Admitting: Physical Therapy

## 2020-12-08 ENCOUNTER — Other Ambulatory Visit (HOSPITAL_COMMUNITY)
Admission: RE | Admit: 2020-12-08 | Discharge: 2020-12-08 | Disposition: A | Discharge status: Home / Self Care | Payer: Medicare Other | Source: Ambulatory Visit | Attending: Gastroenterology | Admitting: Gastroenterology

## 2020-12-08 ENCOUNTER — Encounter: Payer: Self-pay | Admitting: *Deleted

## 2020-12-08 ENCOUNTER — Ambulatory Visit (HOSPITAL_COMMUNITY): Payer: Medicare Other

## 2020-12-08 ENCOUNTER — Ambulatory Visit (INDEPENDENT_AMBULATORY_CARE_PROVIDER_SITE_OTHER): Payer: Medicare Other | Admitting: Gastroenterology

## 2020-12-08 VITALS — BP 160/62 | HR 72 | Temp 97.3°F | Ht 64.0 in | Wt 208.4 lb

## 2020-12-08 DIAGNOSIS — R112 Nausea with vomiting, unspecified: Secondary | ICD-10-CM | POA: Diagnosis not present

## 2020-12-08 DIAGNOSIS — R634 Abnormal weight loss: Secondary | ICD-10-CM | POA: Diagnosis not present

## 2020-12-08 DIAGNOSIS — R1031 Right lower quadrant pain: Secondary | ICD-10-CM | POA: Diagnosis not present

## 2020-12-08 DIAGNOSIS — R630 Anorexia: Secondary | ICD-10-CM | POA: Diagnosis not present

## 2020-12-08 DIAGNOSIS — G8929 Other chronic pain: Secondary | ICD-10-CM | POA: Diagnosis not present

## 2020-12-08 DIAGNOSIS — N289 Disorder of kidney and ureter, unspecified: Secondary | ICD-10-CM | POA: Diagnosis not present

## 2020-12-08 LAB — URINALYSIS, ROUTINE W REFLEX MICROSCOPIC
Bilirubin Urine: NEGATIVE
Glucose, UA: NEGATIVE mg/dL
Hgb urine dipstick: NEGATIVE
Ketones, ur: NEGATIVE mg/dL
Leukocytes,Ua: NEGATIVE
Nitrite: NEGATIVE
Protein, ur: NEGATIVE mg/dL
Specific Gravity, Urine: 1.006 (ref 1.005–1.030)
pH: 5 (ref 5.0–8.0)

## 2020-12-08 NOTE — Telephone Encounter (Signed)
Patient fell at [facility]- she hit the door going out and cut her head. Cx 3/1 and r.s for Friday if possible

## 2020-12-08 NOTE — Progress Notes (Signed)
Primary Care Physician:  [name], MD  Primary Gastroenterologist:  Elon Alas. Abbey Chatters, DO   Chief Complaint  Patient presents with  . Abdominal Pain    Constant lower abd pain across, sharp pains RLQ  . Nausea    W/ some vomiting. Also having issues with adrenal glands. Taking zofran   . Diarrhea/constipation    HPI:  Valicia Rief is a 60 y.o. female here at the request of Dr. Allyn Kenner for further evaluation of appetite change and weight loss.  Patient weighed 254 pounds in February 2021.  Last year she worked to lose some weight, was "juicing".  Got down to around 234 pounds.  In January 2022 she got Covid.  Tested + October 15, 2020.  Her weight is down now to 208.4 pounds.  Complains of fatigue.  Thinks that might be due to adrenal issues.  Was given steroids chronically for her back and then again for Covid.  Went for ACTH stimulation test but has not got those results back yet.  Followed with [name].  Patient states that her taste and smell are fine but she does not have an appetite.  Can go all day without eating.  Also only able to eat a very small portion.  This is all come about since Covid.  Patient complains of pain all across the lower abdomen, sharp pains in the right lower quadrant.  Pain is crampy in nature, like a period.  Occurring for months.  She has had some nausea with vomiting.  Taking Zofran.  Bowels fluctuate between diarrhea and constipation, predominance to diarrhea. No fecal incontinence or nocturnal stools.  Feels like she has difficulty digesting tough foods, pork.  No heartburn.  No melena or rectal bleeding.  Patient also complains of urinary incontinence, worse over the past year. Can't get out of bed and get to the bathroom without having significant incontinence.  States she has a cyst on her kidney and it needs to be followed up on.  Has had 3 C-sections and has a retroverted uterus which she feels like is contributing to her symptoms.  Has a lot of back  pain so not sure if her pain is associated to referred pain. When she first hurt her back she had excruciating pain in the RLQ but now pain is better. Has been using zofran regularly for nausea.   Last colonoscopy in [place], 3 years ago, diverticulosis.  She is not sure but thinks her father may have had colon cancer.  Daughter has a history of celiac disease. Patient states she has been screened for celiac and was negative. I do not have access to those records.        Current Outpatient Medications  Medication Sig Dispense Refill  . acetaminophen (TYLENOL) 325 MG tablet Take 2 tablets (650 mg total) by mouth every 6 (six) hours as needed for mild pain, fever or headache. 30 tablet 1  . albuterol (PROVENTIL) (2.5 MG/3ML) 0.083% nebulizer solution Take 3 mLs (2.5 mg total) by nebulization every 4 (four) hours as needed for wheezing or shortness of breath. 75 mL 1  . albuterol (VENTOLIN HFA) 108 (90 Base) MCG/ACT inhaler INHALE 2 PUFFS INTO THE LUNGS EVERY 6 HOURS AS NEEDED FOR WHEEZING OR SHORTNESS OF BREATH 18 g 1  . apixaban (ELIQUIS) 5 MG TABS tablet Take 5 mg by mouth 2 (two) times daily.    Marland Kitchen atorvastatin (LIPITOR) 10 MG tablet Take 10 mg by mouth every evening.     Marland Kitchen  BD PEN NEEDLE NANO 2ND GEN 32G X 4 MM MISC SMARTSIG:Injection 4 Times Daily    . Benralizumab (FASENRA PEN) 30 MG/ML SOAJ Inject 30 mg into the skin every 8 (eight) weeks. 1 mL 6  . Budeson-Glycopyrrol-Formoterol (BREZTRI AEROSPHERE) 160-9-4.8 MCG/ACT AERO Inhale 1 puff into the lungs 2 (two) times daily.    . Cholecalciferol (VITAMIN D3) 125 MCG (5000 UT) CAPS Take 1 capsule (5,000 Units total) by mouth daily. 90 capsule 0  . diltiazem (CARDIZEM CD) 360 MG 24 hr capsule Take 1 capsule (360 mg total) by mouth daily. 90 capsule 3  . divalproex (DEPAKOTE ER) 500 MG 24 hr tablet 500 mg in AM, 1000 mg at night 270 tablet 0  . Dulaglutide 1.5 MG/0.5ML SOPN Inject 1.5 mg into the skin every Sunday.     Marland Kitchen EPINEPHrine 0.3 mg/0.3  mL IJ SOAJ injection     . gabapentin (NEURONTIN) 600 MG tablet Take 300 mg by mouth 3 (three) times daily.     Marland Kitchen HUMALOG KWIKPEN 100 UNIT/ML KwikPen Inject 5-11 Units into the skin 3 (three) times daily before meals. Sliding scale.    . hydrALAZINE (APRESOLINE) 25 MG tablet Take 25 mg by mouth 3 (three) times daily.    . hydrOXYzine (ATARAX/VISTARIL) 25 MG tablet Take 1 tablet (25 mg total) by mouth every 6 (six) hours as needed for anxiety or nausea. 30 tablet 0  . Immune Globulin, Human, 4 GM/20ML SOLN Inject 100 mLs into the skin every 14 (fourteen) days.    Marland Kitchen LANTUS SOLOSTAR 100 UNIT/ML Solostar Pen Inject 45 Units into the skin.     Marland Kitchen levothyroxine (SYNTHROID, LEVOTHROID) 112 MCG tablet Take 112 mcg by mouth daily before breakfast.    . Magnesium Oxide 400 MG CAPS Take 1 capsule (400 mg total) by mouth daily. 90 capsule 3  . Melatonin 3 MG TABS Take 3 mg by mouth at bedtime.    . Nebulizer MISC Nebulizer tubing kit 2 each 5  . omeprazole (PRILOSEC) 20 MG capsule TAKE 1 CAPSULE(20 MG) BY MOUTH DAILY (Patient taking differently: As needed) 90 capsule 1  . ondansetron (ZOFRAN) 4 MG tablet Take 1 tablet (4 mg total) by mouth every 8 (eight) hours as needed for nausea or vomiting. 20 tablet 0  . ONETOUCH ULTRA test strip USE TO CHECK BLOOD SUGAR FOUR TIMES DAILY    . oxyCODONE-acetaminophen (PERCOCET) 5-325 MG tablet Take 1-2 tablets by mouth every 6 (six) hours as needed. 20 tablet 0  . potassium chloride SA (KLOR-CON) 20 MEQ tablet Take 20 mEq by mouth daily.    . Tiotropium Bromide Monohydrate (SPIRIVA RESPIMAT) 1.25 MCG/ACT AERS Inhale 2 puffs into the lungs daily. 4 g 5  . topiramate (TOPAMAX) 100 MG tablet Take 1 tablet (100 mg total) by mouth 2 (two) times daily. 180 tablet 1  . torsemide (DEMADEX) 20 MG tablet Take 2 tablets (40 mg total) by mouth daily. 180 tablet 3   Current Facility-Administered Medications  Medication Dose Route Frequency Provider Last Rate Last Admin  .  Benralizumab SOSY 30 mg  30 mg Subcutaneous Q28 days Valentina Shaggy, MD   30 mg at 07/03/20 1017    Allergies as of 12/08/2020 - Review Complete 12/08/2020  Allergen Reaction Noted  . Ativan [lorazepam] Hives 02/06/2018  . Phenergan [promethazine hcl] Hives 02/06/2018    Past Medical History:  Diagnosis Date  . Asthma   . Atrial fibrillation ([place])   . Bipolar disorder ([place])   . Bronchiectasis (  [place])   . CHF (congestive heart failure) ([place])   . Complication of anesthesia    "my Dr in [place] told me I had an allergic reaction to the combination of the pain meds and the anesthesia" she does not remember what happened but "I eneded up in the ICU for 5 days".  . H/O congenital atrial septal defect (ASD) repair   . Hypogammaglobulinemia ([place])   . Hypothyroid   . 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 Neuropathy   . Osteoarthritis   . PTSD (post-traumatic stress disorder)   . Recurrent sinusitis   . Short-term memory loss   . Sleep apnea   . TIA (transient ischemic attack)   . Tremors of nervous system    seeing PCP for this.  . Type 2 diabetes mellitus ([place])   . Urticaria   . Wound infection     Past Surgical History:  Procedure Laterality Date  . 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 CARDIAC SURGERY    . CARDIOVERSION    . CARDIOVERSION N/A 08/29/2018   Procedure: CARDIOVERSION;  Surgeon: Arnoldo Lenis, MD;  Location: [facility];  Service: Endoscopy;  Laterality: N/A;  . [place]  . LEFT HEART CATH AND CORONARY ANGIOGRAPHY N/A 08/30/2019   Procedure: LEFT HEART CATH AND CORONARY ANGIOGRAPHY;  Surgeon: [name], MD;  Location: [facility];  Service: Cardiovascular;  Laterality: N/A;  . NASAL SINUS SURGERY  2017    Family History  Problem Relation Age of Onset  . Hypertension Mother   . Stroke Mother   . Kidney disease Mother   . Heart attack Mother   . Alcohol abuse Mother   . Other Father        ?colon cancer  . Kidney disease Sister    . Heart disease Brother   . Stroke Maternal Aunt   . Heart disease Maternal Aunt   . Hypertension Sister   . Bipolar disorder Sister   . Thyroid disease Sister   . Prostate cancer Brother   . Thyroid disease Daughter   . Hashimoto's thyroiditis Daughter   . Celiac disease Daughter   . Allergic rhinitis Daughter   . Asthma Neg Hx     Social History   Socioeconomic History  . Marital status: Widowed    Spouse name: Not on file  . Number of children: Not on file  . Years of education: Not on file  . Highest education level: Not on file  Occupational History  . Not on file  Tobacco Use  . Smoking status: Former Smoker    Packs/day: 1.00    Years: 28.00    Pack years: 28.00    Types: Cigarettes    Start date: 98    Quit date: 2008    Years since quitting: 14.1  . Smokeless tobacco: Never Used  Vaping Use  . Vaping Use: Never used  Substance and Sexual Activity  . Alcohol use: Not Currently  . Drug use: Not Currently  . Sexual activity: Yes    Birth control/protection: None  Other Topics Concern  . Not on file  Social History Narrative  . Not on file   Social Determinants of Health   Financial Resource Strain: Not on file  Food Insecurity: Not on file  Transportation Needs: Not on file  Physical Activity: Not on file  Stress: Not on file  Social Connections: Not on file  Intimate Partner Violence: Not on file  ROS:  General: Negative for fever, chills. See hpi Eyes: Negative for vision changes.  ENT: Negative for hoarseness, difficulty swallowing , nasal congestion. CV: Negative for chest pain, angina, palpitations, dyspnea on exertion, peripheral edema.  Respiratory: Negative for dyspnea at rest, dyspnea on exertion, cough, sputum, wheezing.  GI: See history of present illness. GU:  Negative for dysuria, hematuria, urinary incontinence, urinary frequency, nocturnal urination.  MS: Negative for joint pain, low back pain.  Derm: Negative for rash  or itching.  Neuro: Negative for weakness, abnormal sensation, seizure, frequent headaches, memory loss, confusion.  Psych: Negative for anxiety, depression, suicidal ideation, hallucinations.  Endo: see hpi.  Heme: Negative for bruising or bleeding. Allergy: Negative for rash or hives.    Physical Examination:  BP (!) 160/62   Pulse 72   Temp (!) 97.3 F (36.3 C)   Ht _0  (1.626 m)   Wt 208 lb 6.4 oz (94.5 kg)   BMI 35.77 kg/m    General: Well-nourished, well-developed in no acute distress.  Head: Normocephalic, atraumatic.   Eyes: Conjunctiva pink, no icterus. Mouth: masked Lungs: Clear to auscultation bilaterally.  Heart: Regular rate and rhythm, no murmurs rubs or gallops.  Abdomen: Bowel sounds are normal, nondistended, no hepatosplenomegaly or masses, no abdominal bruits or hernia, no rebound or guarding.  abd tenderness to deep palpation right > left Rectal: not performed Extremities: No lower extremity edema. No clubbing or deformities.  Neuro: Alert and oriented x 4 , grossly normal neurologically.  Skin: Warm and dry, no rash or jaundice.   Psych: Alert and cooperative, normal mood and affect.  Labs: Lab Results  Component Value Date   WBC 5.9 12/07/2020   HGB 12.8 12/07/2020   HCT 39.1 12/07/2020   MCV 100.0 12/07/2020   PLT 182 12/07/2020   Lab Results  Component Value Date   INR 1.1 12/07/2020   Lab Results  Component Value Date   CREATININE 0.64 12/07/2020   BUN 13 12/07/2020   NA 139 12/07/2020   K 3.6 12/07/2020   CL 108 12/07/2020   CO2 21 (L) 12/07/2020   Lab Results  Component Value Date   ALT 8 11/02/2020   AST 15 11/02/2020   ALKPHOS 69 11/02/2020   BILITOT 0.5 08/13/2020   Lab Results  Component Value Date   HGBA1C 7.7 11/02/2020   Lab Results  Component Value Date   VITAMINB12 340 11/02/2020     Imaging Studies: CT Head Wo Contrast  Result Date: 12/07/2020 CLINICAL DATA:  Head trauma, coagulopathy. Neck trauma and head  trauma due to fall today. Neck trauma, dangerous injury mechanism. EXAM: CT HEAD WITHOUT CONTRAST CT CERVICAL SPINE WITHOUT CONTRAST TECHNIQUE: Multidetector CT imaging of the head and cervical spine was performed following the standard protocol without intravenous contrast. Multiplanar CT image reconstructions of the cervical spine were also generated. COMPARISON:  Brain MRI 07/18/2018. Head CT 07/10/2018. Cervical spine MRI 11/19/2020. FINDINGS: CT HEAD FINDINGS Brain: There is no acute intracranial hemorrhage. No demarcated cortical infarct. No extra-axial fluid collection. No evidence of intracranial mass. No midline shift. Partially empty sella turcica. Vascular: No hyperdense vessel.  Atherosclerotic calcifications. Skull: Normal. Negative for fracture or focal lesion. Sinuses/Orbits: No acute finding within the orbits. Trace bilateral ethmoid sinus mucosal thickening. Other: Left temporoparietal and frontal scalp hematomas. The left frontal scalp hematoma extends to the left periorbital region. CT CERVICAL SPINE FINDINGS Alignment: Straightening of the expected cervical lordosis. No significant spondylolisthesis. Skull base and vertebrae: The basion-dental and  atlanto-dental intervals are maintained.No evidence of acute fracture to the cervical spine. Soft tissues and spinal canal: No prevertebral fluid or swelling. No visible canal hematoma. Disc levels: Cervical spondylosis with multilevel disc space narrowing, disc bulges, central disc protrusions, posterior disc osteophytes, uncovertebral hypertrophy and facet arthrosis. Moderate disc space narrowing at the C3-C4 through C7-T1 levels. Multilevel ventrolateral osteophytes. Multilevel spinal canal stenosis. Most notably, a posterior disc osteophyte complex contributes to severe spinal canal stenosis at C4-C5. Also of note, a partially calcified left center disc protrusion contributes to at least moderate spinal canal stenosis at C3-C4. Multilevel bony  neural foraminal narrowing. Upper chest: No consolidation within the imaged lung apices. No visible pneumothorax. Other: Calcified atherosclerotic plaque within the visualized aortic arch, proximal major branch vessels of the neck and carotid arteries. IMPRESSION: CT head: 1. No evidence of acute intracranial abnormality. 2. Left temporoparietal and frontal scalp hematomas. The left frontal scalp hematoma extends to the left periorbital region. CT cervical spine: 1. No evidence of acute fracture to the cervical spine. 2. Nonspecific straightening of the expected cervical lordosis. 3. Age advanced cervical spondylosis as described. Most notably at C4-C5, there is multifactorial severe spinal canal stenosis. Also of note at C3-C4, a partially calcified left center disc protrusion contributes to at least moderate spinal canal stenosis. 4. Aortic Atherosclerosis (ICD10-I70.0). Calcified plaque is also present within the proximal major branch vessels of the neck and carotid arteries. Electronically Signed   By: Kellie Simmering DO   On: 12/07/2020 14:42   CT Cervical Spine Wo Contrast  Result Date: 12/07/2020 CLINICAL DATA:  Head trauma, coagulopathy. Neck trauma and head trauma due to fall today. Neck trauma, dangerous injury mechanism. EXAM: CT HEAD WITHOUT CONTRAST CT CERVICAL SPINE WITHOUT CONTRAST TECHNIQUE: Multidetector CT imaging of the head and cervical spine was performed following the standard protocol without intravenous contrast. Multiplanar CT image reconstructions of the cervical spine were also generated. COMPARISON:  Brain MRI 07/18/2018. Head CT 07/10/2018. Cervical spine MRI 11/19/2020. FINDINGS: CT HEAD FINDINGS Brain: There is no acute intracranial hemorrhage. No demarcated cortical infarct. No extra-axial fluid collection. No evidence of intracranial mass. No midline shift. Partially empty sella turcica. Vascular: No hyperdense vessel.  Atherosclerotic calcifications. Skull: Normal. Negative for  fracture or focal lesion. Sinuses/Orbits: No acute finding within the orbits. Trace bilateral ethmoid sinus mucosal thickening. Other: Left temporoparietal and frontal scalp hematomas. The left frontal scalp hematoma extends to the left periorbital region. CT CERVICAL SPINE FINDINGS Alignment: Straightening of the expected cervical lordosis. No significant spondylolisthesis. Skull base and vertebrae: The basion-dental and atlanto-dental intervals are maintained.No evidence of acute fracture to the cervical spine. Soft tissues and spinal canal: No prevertebral fluid or swelling. No visible canal hematoma. Disc levels: Cervical spondylosis with multilevel disc space narrowing, disc bulges, central disc protrusions, posterior disc osteophytes, uncovertebral hypertrophy and facet arthrosis. Moderate disc space narrowing at the C3-C4 through C7-T1 levels. Multilevel ventrolateral osteophytes. Multilevel spinal canal stenosis. Most notably, a posterior disc osteophyte complex contributes to severe spinal canal stenosis at C4-C5. Also of note, a partially calcified left center disc protrusion contributes to at least moderate spinal canal stenosis at C3-C4. Multilevel bony neural foraminal narrowing. Upper chest: No consolidation within the imaged lung apices. No visible pneumothorax. Other: Calcified atherosclerotic plaque within the visualized aortic arch, proximal major branch vessels of the neck and carotid arteries. IMPRESSION: CT head: 1. No evidence of acute intracranial abnormality. 2. Left temporoparietal and frontal scalp hematomas. The left frontal scalp hematoma extends  to the left periorbital region. CT cervical spine: 1. No evidence of acute fracture to the cervical spine. 2. Nonspecific straightening of the expected cervical lordosis. 3. Age advanced cervical spondylosis as described. Most notably at C4-C5, there is multifactorial severe spinal canal stenosis. Also of note at C3-C4, a partially calcified left  center disc protrusion contributes to at least moderate spinal canal stenosis. 4. Aortic Atherosclerosis (ICD10-I70.0). Calcified plaque is also present within the proximal major branch vessels of the neck and carotid arteries. Electronically Signed   By: Kellie Simmering DO   On: 12/07/2020 14:42   MR CERVICAL SPINE W WO CONTRAST  Result Date: 11/19/2020 CLINICAL DATA:  Pain involving the entire spine after a fall. Spasms. Tremors. EXAM: MRI CERVICAL, THORACIC AND LUMBAR SPINE WITHOUT AND WITH CONTRAST TECHNIQUE: Multiplanar and multiecho pulse sequences of the cervical spine, to include the craniocervical junction and cervicothoracic junction, and thoracic and lumbar spine, were obtained without and with intravenous contrast. CONTRAST:  37m GADAVIST GADOBUTROL 1 MMOL/ML IV SOLN COMPARISON:  None. FINDINGS: Patient claustrophobic and therefore faster scans utilized, limiting evaluation. MRI CERVICAL SPINE FINDINGS Alignment: No substantial sagittal subluxation. Vertebrae: Vertebral body heights are maintained. No specific evidence of acute fracture, discitis/osteomyelitis, or suspicious bone lesion. No abnormal enhancement. Cord: No abnormal cord signal.  No abnormal enhancement identified. Posterior Fossa, vertebral arteries, paraspinal tissues: Vertebral artery flow voids are maintained. Unremarkable posterior fossa. Partially empty and expanded sella. Disc levels: C2-C3: Right greater than left facet and uncovertebral hypertrophy with mild right foraminal stenosis. No significant canal stenosis. C3-C4: Posterior disc osteophyte complex and bilateral facet and uncovertebral hypertrophy. Moderate canal stenosis. Mild bilateral foraminal stenosis. C4-C5: Posterior disc osteophyte complex and bilateral facet and uncovertebral hypertrophy. Resulting moderate to severe canal stenosis and moderate bilateral foraminal stenosis. C5-C6: Posterior disc osteophyte complex and bilateral facet and uncovertebral hypertrophy  with moderate canal stenosis. Moderate bilateral foraminal stenosis. C6-C7: Posterior disc osteophyte complex and mild uncovertebral hypertrophy. Mild canal stenosis without significant foraminal stenosis. C7-T1: Mild facet hypertrophy without significant canal or foraminal stenosis. MRI THORACIC SPINE FINDINGS Alignment: No substantial sagittal subluxation. Mild broad dextrocurvature. Vertebrae: Vertebral body heights are maintained. Mild heterogeneity of the bone marrow without specific evidence of acute fracture, discitis/osteomyelitis, or suspicious bone lesion. Scattered small degenerative endplate Schmorl's nodes. Mild right eccentric edema and enhancement involving the anterior/superior L1 endplates is favored degenerative. Cord: Normal cord signal.  No abnormal enhancement identified. Paraspinal and other soft tissues: Unremarkable. Disc levels: Small disc bulges at T1-T2, T6-T7, T7-T8, T9-T10, T10-T11, which partially efface ventral CSF without significant canal stenosis. The T1-T2 disc bulge mildly deforms the ventral cord. Mild multilevel facet hypertrophy without significant foraminal stenosis. MRI LUMBAR SPINE FINDINGS Segmentation:  Standard. Alignment: Minimal stepwise degenerative retrolisthesis of L2 on L3 and L3 on L4. Mild broad levocurvature. Vertebrae: Vertebral body heights are maintained. No specific evidence of acute fracture, discitis/osteomyelitis, or suspicious bone lesion. Mild right eccentric edema and enhancement involving the anterior/superior L1 endplates is favored degenerative. Mild heterogeneity of the bone marrow. Conus medullaris and cauda equina: Conus extends to the L1-L2 level. Conus appears normal. No abnormal enhancement identified. Paraspinal and other soft tissues: Right extrarenal pelvis. Tiny right renal cyst. Disc levels: T12-L1: Mild disc bulging and mild facet hypertrophy without significant canal or foraminal stenosis. L1-L2: Mild disc bulging and mild facet  hypertrophy without significant canal or foraminal stenosis. L2-L3: Slight retrolisthesis of L2 on L3. Bilateral foraminal disc bulges and mild facet hypertrophy. Prominent dorsal epidural fat.  Resulting mild canal and bilateral subarticular recess stenosis without significant foraminal stenosis. L3-L4: Small broad-based disc bulge, mild bilateral facet hypertrophy, and prominent dorsal epidural fat. Resulting moderate canal and bilateral subarticular recess stenosis without significant foraminal stenosis. Left far lateral disc bulge contacts the exited left L3 nerve. L4-L5: Broad-based disc bulge and moderate bilateral facet hypertrophy with ligamentum flavum thickening and prominent dorsal epidural fat. Resulting moderate canal and right greater than left subarticular recess stenosis. Mild left foraminal stenosis. L5-S1: Broad-based disc bulge with small central disc protrusion. Moderate bilateral facet hypertrophy. Mild left foraminal stenosis. IMPRESSION: Motion limited study without specific evidence of acute abnormality. CT could provide better evaluation of bony detail if clinically indicated. MRI of the cervical spine: 1. Moderate to severe canal stenosis at C4-C5. Moderate canal stenosis at C3-C4 and C5-C6. Mild canal stenosis at C6-C7. 2. Moderate foraminal stenosis bilaterally at C4-C5 and C5-C6. Mild bilateral foraminal stenosis at C3-C4. 3. Partially imaged empty and expanded sella, which is nonspecific but can be seen with idiopathic intracranial hypertension in the correct clinical setting. MRI of the thoracic spine: 1. Mild multilevel degenerative change with multiple small disc bulges. No significant canal or foraminal stenosis in the thoracic spine. MRI of the lumbar spine: 1. Combination of degenerative change and prominent dorsal epidural fat results in moderate canal and bilateral subarticular recess stenosis at L3-L4 and L4-L5. 2. Mild foraminal stenosis on the left at L4-L5 and L5-S1. Far  lateral left disc contacts the exited left L3 nerve at L3-L4. Electronically Signed   By: Margaretha Sheffield MD   On: 11/19/2020 15:51   MR THORACIC SPINE W WO CONTRAST  Result Date: 11/19/2020 CLINICAL DATA:  Pain involving the entire spine after a fall. Spasms. Tremors. EXAM: MRI CERVICAL, THORACIC AND LUMBAR SPINE WITHOUT AND WITH CONTRAST TECHNIQUE: Multiplanar and multiecho pulse sequences of the cervical spine, to include the craniocervical junction and cervicothoracic junction, and thoracic and lumbar spine, were obtained without and with intravenous contrast. CONTRAST:  62m GADAVIST GADOBUTROL 1 MMOL/ML IV SOLN COMPARISON:  None. FINDINGS: Patient claustrophobic and therefore faster scans utilized, limiting evaluation. MRI CERVICAL SPINE FINDINGS Alignment: No substantial sagittal subluxation. Vertebrae: Vertebral body heights are maintained. No specific evidence of acute fracture, discitis/osteomyelitis, or suspicious bone lesion. No abnormal enhancement. Cord: No abnormal cord signal.  No abnormal enhancement identified. Posterior Fossa, vertebral arteries, paraspinal tissues: Vertebral artery flow voids are maintained. Unremarkable posterior fossa. Partially empty and expanded sella. Disc levels: C2-C3: Right greater than left facet and uncovertebral hypertrophy with mild right foraminal stenosis. No significant canal stenosis. C3-C4: Posterior disc osteophyte complex and bilateral facet and uncovertebral hypertrophy. Moderate canal stenosis. Mild bilateral foraminal stenosis. C4-C5: Posterior disc osteophyte complex and bilateral facet and uncovertebral hypertrophy. Resulting moderate to severe canal stenosis and moderate bilateral foraminal stenosis. C5-C6: Posterior disc osteophyte complex and bilateral facet and uncovertebral hypertrophy with moderate canal stenosis. Moderate bilateral foraminal stenosis. C6-C7: Posterior disc osteophyte complex and mild uncovertebral hypertrophy. Mild canal  stenosis without significant foraminal stenosis. C7-T1: Mild facet hypertrophy without significant canal or foraminal stenosis. MRI THORACIC SPINE FINDINGS Alignment: No substantial sagittal subluxation. Mild broad dextrocurvature. Vertebrae: Vertebral body heights are maintained. Mild heterogeneity of the bone marrow without specific evidence of acute fracture, discitis/osteomyelitis, or suspicious bone lesion. Scattered small degenerative endplate Schmorl's nodes. Mild right eccentric edema and enhancement involving the anterior/superior L1 endplates is favored degenerative. Cord: Normal cord signal.  No abnormal enhancement identified. Paraspinal and other soft tissues: Unremarkable. Disc levels: Small  disc bulges at T1-T2, T6-T7, T7-T8, T9-T10, T10-T11, which partially efface ventral CSF without significant canal stenosis. The T1-T2 disc bulge mildly deforms the ventral cord. Mild multilevel facet hypertrophy without significant foraminal stenosis. MRI LUMBAR SPINE FINDINGS Segmentation:  Standard. Alignment: Minimal stepwise degenerative retrolisthesis of L2 on L3 and L3 on L4. Mild broad levocurvature. Vertebrae: Vertebral body heights are maintained. No specific evidence of acute fracture, discitis/osteomyelitis, or suspicious bone lesion. Mild right eccentric edema and enhancement involving the anterior/superior L1 endplates is favored degenerative. Mild heterogeneity of the bone marrow. Conus medullaris and cauda equina: Conus extends to the L1-L2 level. Conus appears normal. No abnormal enhancement identified. Paraspinal and other soft tissues: Right extrarenal pelvis. Tiny right renal cyst. Disc levels: T12-L1: Mild disc bulging and mild facet hypertrophy without significant canal or foraminal stenosis. L1-L2: Mild disc bulging and mild facet hypertrophy without significant canal or foraminal stenosis. L2-L3: Slight retrolisthesis of L2 on L3. Bilateral foraminal disc bulges and mild facet hypertrophy.  Prominent dorsal epidural fat. Resulting mild canal and bilateral subarticular recess stenosis without significant foraminal stenosis. L3-L4: Small broad-based disc bulge, mild bilateral facet hypertrophy, and prominent dorsal epidural fat. Resulting moderate canal and bilateral subarticular recess stenosis without significant foraminal stenosis. Left far lateral disc bulge contacts the exited left L3 nerve. L4-L5: Broad-based disc bulge and moderate bilateral facet hypertrophy with ligamentum flavum thickening and prominent dorsal epidural fat. Resulting moderate canal and right greater than left subarticular recess stenosis. Mild left foraminal stenosis. L5-S1: Broad-based disc bulge with small central disc protrusion. Moderate bilateral facet hypertrophy. Mild left foraminal stenosis. IMPRESSION: Motion limited study without specific evidence of acute abnormality. CT could provide better evaluation of bony detail if clinically indicated. MRI of the cervical spine: 1. Moderate to severe canal stenosis at C4-C5. Moderate canal stenosis at C3-C4 and C5-C6. Mild canal stenosis at C6-C7. 2. Moderate foraminal stenosis bilaterally at C4-C5 and C5-C6. Mild bilateral foraminal stenosis at C3-C4. 3. Partially imaged empty and expanded sella, which is nonspecific but can be seen with idiopathic intracranial hypertension in the correct clinical setting. MRI of the thoracic spine: 1. Mild multilevel degenerative change with multiple small disc bulges. No significant canal or foraminal stenosis in the thoracic spine. MRI of the lumbar spine: 1. Combination of degenerative change and prominent dorsal epidural fat results in moderate canal and bilateral subarticular recess stenosis at L3-L4 and L4-L5. 2. Mild foraminal stenosis on the left at L4-L5 and L5-S1. Far lateral left disc contacts the exited left L3 nerve at L3-L4. Electronically Signed   By: Margaretha Sheffield MD   On: 11/19/2020 15:51   MR Lumbar Spine W Wo  Contrast  Result Date: 11/19/2020 CLINICAL DATA:  Pain involving the entire spine after a fall. Spasms. Tremors. EXAM: MRI CERVICAL, THORACIC AND LUMBAR SPINE WITHOUT AND WITH CONTRAST TECHNIQUE: Multiplanar and multiecho pulse sequences of the cervical spine, to include the craniocervical junction and cervicothoracic junction, and thoracic and lumbar spine, were obtained without and with intravenous contrast. CONTRAST:  68m GADAVIST GADOBUTROL 1 MMOL/ML IV SOLN COMPARISON:  None. FINDINGS: Patient claustrophobic and therefore faster scans utilized, limiting evaluation. MRI CERVICAL SPINE FINDINGS Alignment: No substantial sagittal subluxation. Vertebrae: Vertebral body heights are maintained. No specific evidence of acute fracture, discitis/osteomyelitis, or suspicious bone lesion. No abnormal enhancement. Cord: No abnormal cord signal.  No abnormal enhancement identified. Posterior Fossa, vertebral arteries, paraspinal tissues: Vertebral artery flow voids are maintained. Unremarkable posterior fossa. Partially empty and expanded sella. Disc levels: C2-C3: Right greater  than left facet and uncovertebral hypertrophy with mild right foraminal stenosis. No significant canal stenosis. C3-C4: Posterior disc osteophyte complex and bilateral facet and uncovertebral hypertrophy. Moderate canal stenosis. Mild bilateral foraminal stenosis. C4-C5: Posterior disc osteophyte complex and bilateral facet and uncovertebral hypertrophy. Resulting moderate to severe canal stenosis and moderate bilateral foraminal stenosis. C5-C6: Posterior disc osteophyte complex and bilateral facet and uncovertebral hypertrophy with moderate canal stenosis. Moderate bilateral foraminal stenosis. C6-C7: Posterior disc osteophyte complex and mild uncovertebral hypertrophy. Mild canal stenosis without significant foraminal stenosis. C7-T1: Mild facet hypertrophy without significant canal or foraminal stenosis. MRI THORACIC SPINE FINDINGS Alignment:  No substantial sagittal subluxation. Mild broad dextrocurvature. Vertebrae: Vertebral body heights are maintained. Mild heterogeneity of the bone marrow without specific evidence of acute fracture, discitis/osteomyelitis, or suspicious bone lesion. Scattered small degenerative endplate Schmorl's nodes. Mild right eccentric edema and enhancement involving the anterior/superior L1 endplates is favored degenerative. Cord: Normal cord signal.  No abnormal enhancement identified. Paraspinal and other soft tissues: Unremarkable. Disc levels: Small disc bulges at T1-T2, T6-T7, T7-T8, T9-T10, T10-T11, which partially efface ventral CSF without significant canal stenosis. The T1-T2 disc bulge mildly deforms the ventral cord. Mild multilevel facet hypertrophy without significant foraminal stenosis. MRI LUMBAR SPINE FINDINGS Segmentation:  Standard. Alignment: Minimal stepwise degenerative retrolisthesis of L2 on L3 and L3 on L4. Mild broad levocurvature. Vertebrae: Vertebral body heights are maintained. No specific evidence of acute fracture, discitis/osteomyelitis, or suspicious bone lesion. Mild right eccentric edema and enhancement involving the anterior/superior L1 endplates is favored degenerative. Mild heterogeneity of the bone marrow. Conus medullaris and cauda equina: Conus extends to the L1-L2 level. Conus appears normal. No abnormal enhancement identified. Paraspinal and other soft tissues: Right extrarenal pelvis. Tiny right renal cyst. Disc levels: T12-L1: Mild disc bulging and mild facet hypertrophy without significant canal or foraminal stenosis. L1-L2: Mild disc bulging and mild facet hypertrophy without significant canal or foraminal stenosis. L2-L3: Slight retrolisthesis of L2 on L3. Bilateral foraminal disc bulges and mild facet hypertrophy. Prominent dorsal epidural fat. Resulting mild canal and bilateral subarticular recess stenosis without significant foraminal stenosis. L3-L4: Small broad-based disc  bulge, mild bilateral facet hypertrophy, and prominent dorsal epidural fat. Resulting moderate canal and bilateral subarticular recess stenosis without significant foraminal stenosis. Left far lateral disc bulge contacts the exited left L3 nerve. L4-L5: Broad-based disc bulge and moderate bilateral facet hypertrophy with ligamentum flavum thickening and prominent dorsal epidural fat. Resulting moderate canal and right greater than left subarticular recess stenosis. Mild left foraminal stenosis. L5-S1: Broad-based disc bulge with small central disc protrusion. Moderate bilateral facet hypertrophy. Mild left foraminal stenosis. IMPRESSION: Motion limited study without specific evidence of acute abnormality. CT could provide better evaluation of bony detail if clinically indicated. MRI of the cervical spine: 1. Moderate to severe canal stenosis at C4-C5. Moderate canal stenosis at C3-C4 and C5-C6. Mild canal stenosis at C6-C7. 2. Moderate foraminal stenosis bilaterally at C4-C5 and C5-C6. Mild bilateral foraminal stenosis at C3-C4. 3. Partially imaged empty and expanded sella, which is nonspecific but can be seen with idiopathic intracranial hypertension in the correct clinical setting. MRI of the thoracic spine: 1. Mild multilevel degenerative change with multiple small disc bulges. No significant canal or foraminal stenosis in the thoracic spine. MRI of the lumbar spine: 1. Combination of degenerative change and prominent dorsal epidural fat results in moderate canal and bilateral subarticular recess stenosis at L3-L4 and L4-L5. 2. Mild foraminal stenosis on the left at L4-L5 and L5-S1. Far lateral left disc contacts  the exited left L3 nerve at L3-L4. Electronically Signed   By: Margaretha Sheffield MD   On: 11/19/2020 15:51   DG Pelvis Portable  Result Date: 12/07/2020 CLINICAL DATA:  Trauma Fall Hip pain EXAM: PORTABLE PELVIS 1-2 VIEWS COMPARISON:  None. FINDINGS: No acute fracture or dislocation. Mild spurring of  the hips bilaterally. Lower lumbar degenerative changes partially visualized. IMPRESSION: No acute fracture or dislocation of the pelvis. Electronically Signed   By: Miachel Roux M.D.   On: 12/07/2020 14:10   DG Chest Port 1 View  Result Date: 12/07/2020 CLINICAL DATA:  Trauma Fall EXAM: PORTABLE CHEST 1 VIEW COMPARISON:  07/03/2020 FINDINGS: The heart size and mediastinal contours are within normal limits. Both lungs are clear. The visualized skeletal structures are unremarkable. IMPRESSION: No acute cardiopulmonary process. Electronically Signed   By: Miachel Roux M.D.   On: 12/07/2020 14:11   DG Foot Complete Right  Result Date: 12/07/2020 CLINICAL DATA:  Fall EXAM: RIGHT FOOT COMPLETE - 3+ VIEW COMPARISON:  06/23/2020 FINDINGS: Negative for fracture. No significant arthropathy. Prominent calcaneal spurring at the Achilles tendon and plantar fascia insertion. IMPRESSION: Negative for fracture. Prominent calcaneal spurring Electronically Signed   By: Franchot Gallo M.D.   On: 12/07/2020 15:34   ECHOCARDIOGRAM COMPLETE  Result Date: 11/12/2020    ECHOCARDIOGRAM REPORT   Patient Name:   Laura [name] Date of Exam: 11/12/2020 Medical Rec #:  [id number]      Height:       64.0 in Accession #:    [id number]     Weight:       204.6 lb Date of Birth:  [birth year]      BSA:          1.975 m Patient Age:    48 years       BP:           146/70 mmHg Patient Gender: F              HR:           73 bpm. Exam Location:  [facility] Procedure: 2D Echo, Cardiac Doppler and Color Doppler Indications:    R06.00 (ICD-10-CM) - DOE (dyspnea on exertion)  History:        Patient has prior history of Echocardiogram examinations, most                 recent 08/29/2019. TIA, Arrythmias:Atrial Fibrillation and                 Atrial Flutter; Risk Factors:Hypertension, Diabetes,                 Dyslipidemia and Former Smoker. PTSD, Bipolar, H/O congenital                 atrial septal defect (ASD) repair, OSA on CPAP, S/P Covid-19.   Sonographer:    Alvino Chapel RCS Referring Phys: [id number] Erma Heritage  Sonographer Comments: Patient VERY sensitive to touch when scanning, she says has been like since contracting Covid-19. IMPRESSIONS  1. Left ventricular ejection fraction, by estimation, is 65 to 70%. The left ventricle has normal function. The left ventricle has no regional wall motion abnormalities. There is mild left ventricular hypertrophy. Left ventricular diastolic parameters are indeterminate. Elevated left atrial pressure.  2. Right ventricular systolic function is low normal. The right ventricular size is normal. Tricuspid regurgitation signal is inadequate for assessing PA pressure.  3. Left atrial size was moderately dilated.  4. Reported  to be status post ASD repair. Atrial septum is thickened without obvious shunt.  5. The mitral valve is abnormal. Trivial mitral valve regurgitation. Moderate to severe mitral annular calcification.  6. The aortic valve is tricuspid. There is mild calcification of the aortic valve. Aortic valve regurgitation is not visualized.  7. The inferior vena cava is normal in size with greater than 50% respiratory variability, suggesting right atrial pressure of 3 mmHg. FINDINGS  Left Ventricle: Left ventricular ejection fraction, by estimation, is 65 to 70%. The left ventricle has normal function. The left ventricle has no regional wall motion abnormalities. The left ventricular internal cavity size was normal in size. There is  mild left ventricular hypertrophy. Left ventricular diastolic parameters are indeterminate. Elevated left atrial pressure. Right Ventricle: The right ventricular size is normal. No increase in right ventricular wall thickness. Right ventricular systolic function is low normal. Tricuspid regurgitation signal is inadequate for assessing PA pressure. Left Atrium: Left atrial size was moderately dilated. Right Atrium: Right atrial size was normal in size. Pericardium: There is no  evidence of pericardial effusion. Mitral Valve: The mitral valve is abnormal. Moderate to severe mitral annular calcification. Trivial mitral valve regurgitation. Tricuspid Valve: The tricuspid valve is grossly normal. Tricuspid valve regurgitation is trivial. Aortic Valve: The aortic valve is tricuspid. There is mild calcification of the aortic valve. There is mild to moderate aortic valve annular calcification. Aortic valve regurgitation is not visualized. Pulmonic Valve: The pulmonic valve was grossly normal. Pulmonic valve regurgitation is trivial. Aorta: The aortic root is normal in size and structure. Venous: The inferior vena cava is normal in size with greater than 50% respiratory variability, suggesting right atrial pressure of 3 mmHg. IAS/Shunts: No atrial level shunt detected by color flow Doppler.  LEFT VENTRICLE PLAX 2D LVIDd:         4.50 cm  Diastology LVIDs:         2.80 cm  LV e' medial:    7.72 cm/s LV PW:         1.20 cm  LV E/e' medial:  20.6 LV IVS:        1.20 cm  LV e' lateral:   7.83 cm/s LVOT diam:     1.80 cm  LV E/e' lateral: 20.3 LV SV:         87 LV SV Index:   44 LVOT Area:     2.54 cm  RIGHT VENTRICLE RV S prime:     8.70 cm/s TAPSE (M-mode): 1.4 cm LEFT ATRIUM              Index       RIGHT ATRIUM           Index LA diam:        4.30 cm  2.18 cm/m  RA Area:     18.60 cm LA Vol (A2C):   100.0 ml 50.62 ml/m RA Volume:   55.00 ml  27.84 ml/m LA Vol (A4C):   78.3 ml  39.64 ml/m LA Biplane Vol: 88.9 ml  45.00 ml/m  AORTIC VALVE LVOT Vmax:   137.00 cm/s LVOT Vmean:  102.000 cm/s LVOT VTI:    0.342 m  AORTA Ao Root diam: 3.30 cm MITRAL VALVE MV Area (PHT): 3.99 cm     SHUNTS MV Decel Time: 190 msec     Systemic VTI:  0.34 m MV E velocity: 159.00 cm/s  Systemic Diam: 1.80 cm MV A velocity: 74.40 cm/s MV E/A ratio:  2.14 Mikeal Hawthorne  Mcdowell MD Electronically signed by Rozann Lesches MD Signature Date/Time: 11/12/2020/12:18:29 PM    Final    Assessment: 60 y/o female with decreased  appetite, weight loss, nausea, RLQ pain, alternating constipation/diarrhea presenting for evaluation.   Complains of constant urinary with no control of her bladder. Urinary frequent. Right renal cyst/lesion noted on MRI thoracic spine. She has not seen an urologist for gyn for urinary incontinence. She has chronic back pain/neck pain with recent MRI cervical/thoracic/lumbar region. Some moderate foraminal stenoses in cervical region and moderate canal and bilateral subarticular recess stenosis at L3-L4 and L4-L5. Unclear if contributing to her pain or urinary incontinence.   RLQ pain, progressive. With constipation/diarrhea, nausea. Consider CT to further evaluate, rule out appendicitis. Can also evaluate right renal lesion seen on MRI.   Alternating constipation/diarrhea with predominance to diarrhea. Patient states she has been screened for celiac disease given her daughter has celiac. Reports colonoscopy about 3 years ago with diverticulosis.   Decreased appetite, nausea, unintentional weight loss with recent COVID 10/2020. Patient describes early satiety as well. Query gastritis, PUD, post-viral gastroparesis, adrenal insufficiency.   Plan:  1. CT A/P with contrast. 2. U/A with culture.  3. F/U ACTH stimulation results.  4. Further recommendations to follow.

## 2020-12-08 NOTE — Patient Instructions (Addendum)
1. Please complete urine test at lab. 2. CT scan of your abdomen as scheduled. We will be in touch with results as available.

## 2020-12-09 ENCOUNTER — Ambulatory Visit (HOSPITAL_COMMUNITY)
Admission: RE | Admit: 2020-12-09 | Discharge: 2020-12-09 | Disposition: A | Discharge status: Home / Self Care | Payer: Medicare Other | Source: Ambulatory Visit | Attending: Gastroenterology | Admitting: Gastroenterology

## 2020-12-09 ENCOUNTER — Encounter (HOSPITAL_COMMUNITY): Payer: Self-pay | Admitting: Radiology

## 2020-12-09 ENCOUNTER — Encounter (HOSPITAL_COMMUNITY): Payer: Self-pay

## 2020-12-09 DIAGNOSIS — R634 Abnormal weight loss: Secondary | ICD-10-CM | POA: Insufficient documentation

## 2020-12-09 DIAGNOSIS — R1031 Right lower quadrant pain: Secondary | ICD-10-CM | POA: Insufficient documentation

## 2020-12-09 DIAGNOSIS — N289 Disorder of kidney and ureter, unspecified: Secondary | ICD-10-CM | POA: Diagnosis not present

## 2020-12-09 DIAGNOSIS — R630 Anorexia: Secondary | ICD-10-CM | POA: Insufficient documentation

## 2020-12-09 DIAGNOSIS — R112 Nausea with vomiting, unspecified: Secondary | ICD-10-CM | POA: Diagnosis not present

## 2020-12-09 DIAGNOSIS — G8929 Other chronic pain: Secondary | ICD-10-CM | POA: Diagnosis not present

## 2020-12-09 LAB — URINE CULTURE: Culture: NO GROWTH

## 2020-12-09 IMAGING — CT CT ABD-PELV W/ CM
2 of 9 series · 13 of 46 positions shown, 18 images · IV contrast (Omnipaque or Isovue)
Comparison: None.

CLINICAL DATA: Right lower quadrant pain.

EXAM:
CT ABDOMEN AND PELVIS WITH CONTRAST
TECHNIQUE: Multidetector CT imaging of the abdomen and pelvis was performed
using the standard protocol following bolus administration of
intravenous contrast.
CONTRAST:  100mL OMNIPAQUE IOHEXOL 300 MG/ML  SOLN

[Series 2: axial st · axial · 0.82mm/px · z∈[+620,+1020]mm · 10 of 96 slices shown, 15 images]
[im 8/96  soft-tissue]
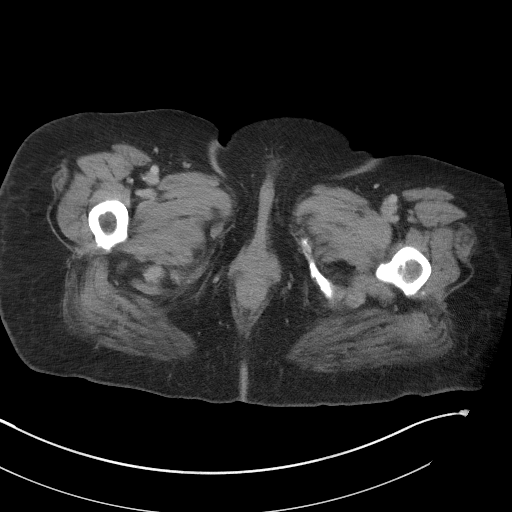
[im 8/96  bone]
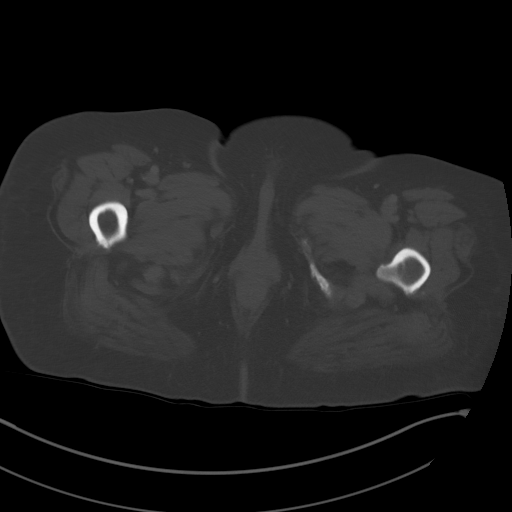
[im 16/96  soft-tissue]
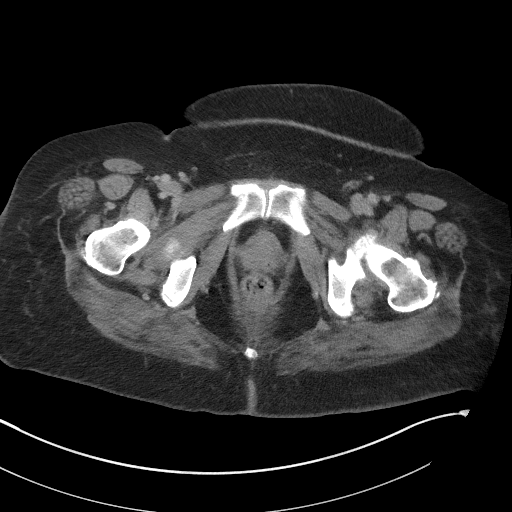
[im 32/96  soft-tissue]
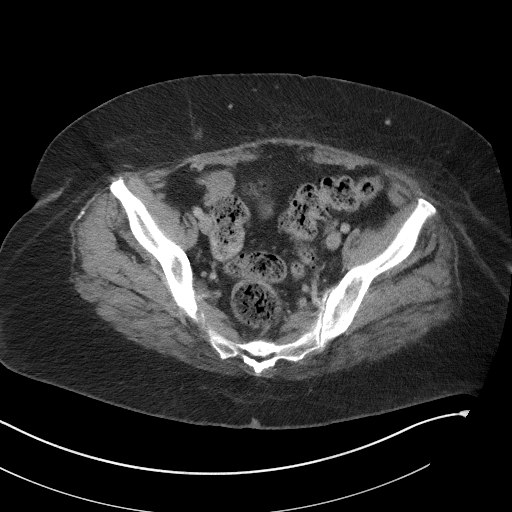
[im 40/96  soft-tissue]
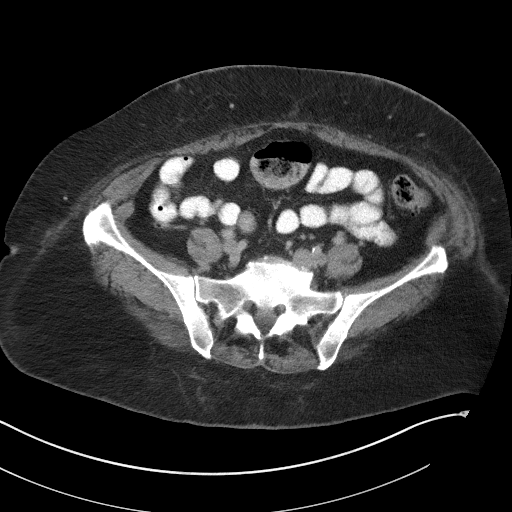
[im 48/96  soft-tissue]
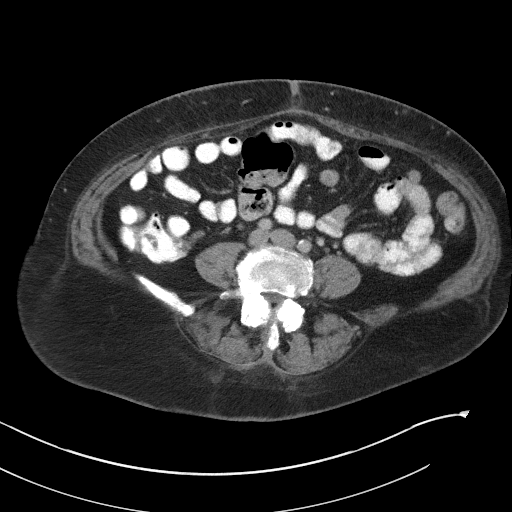
[im 56/96  soft-tissue]
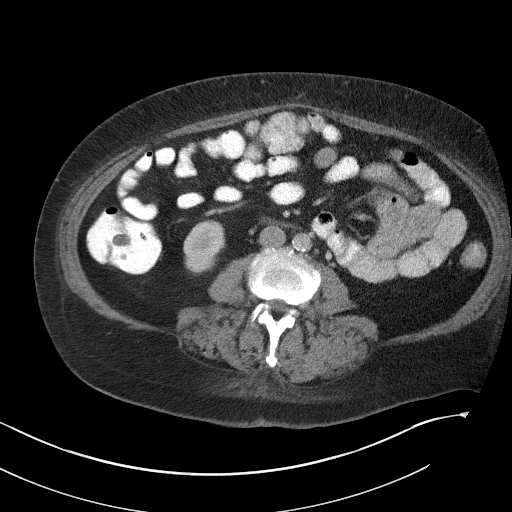
[im 64/96  soft-tissue]
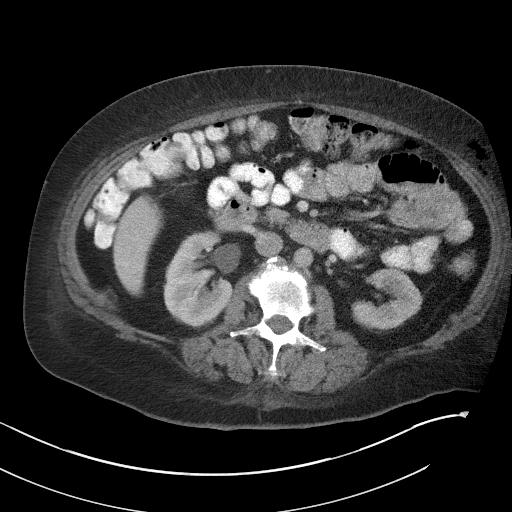
[im 64/96  lung]
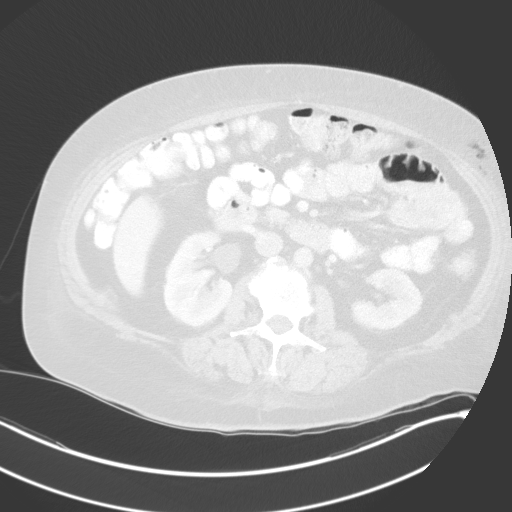
[im 72/96  lung]
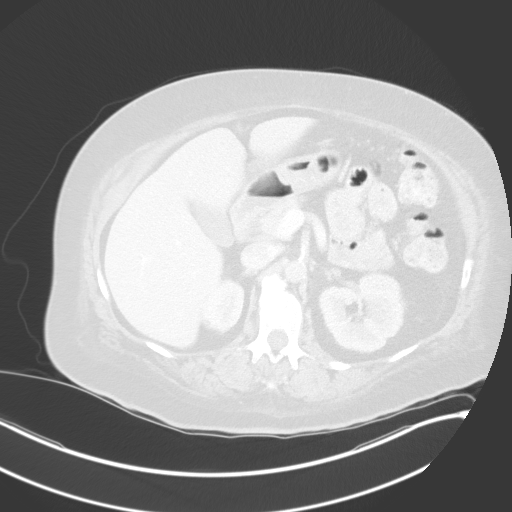
[im 80/96  soft-tissue]
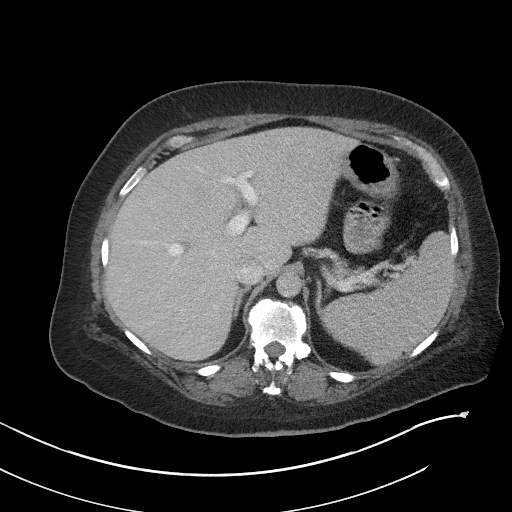
[im 80/96  lung]
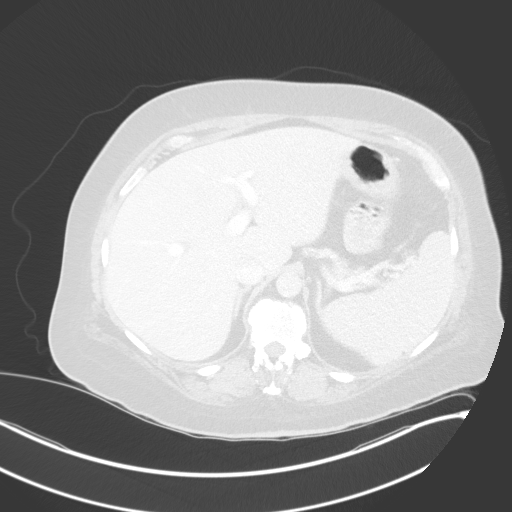
[im 88/96  soft-tissue]
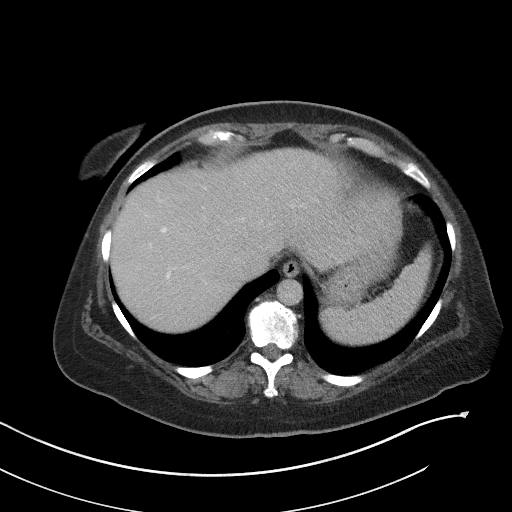
[im 88/96  lung]
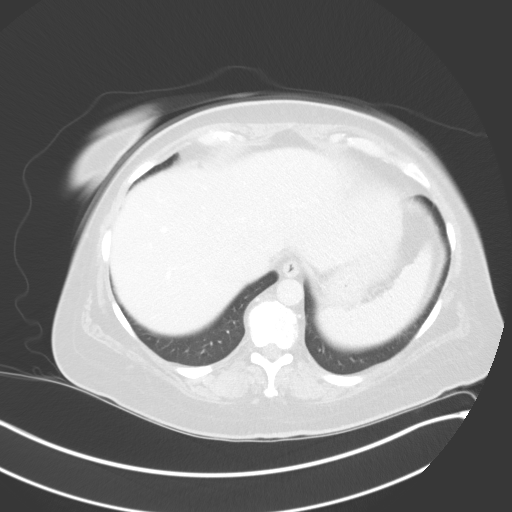
[im 88/96  bone]
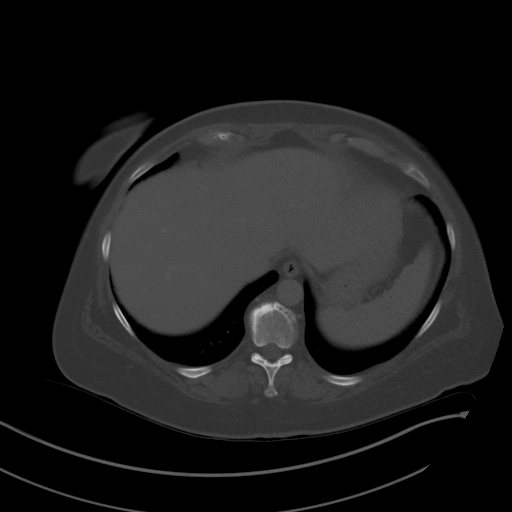

[Series 5: coronal st · coronal · 0.94mm/px · 3 of 117 slices shown]
[im 30/117  soft-tissue]
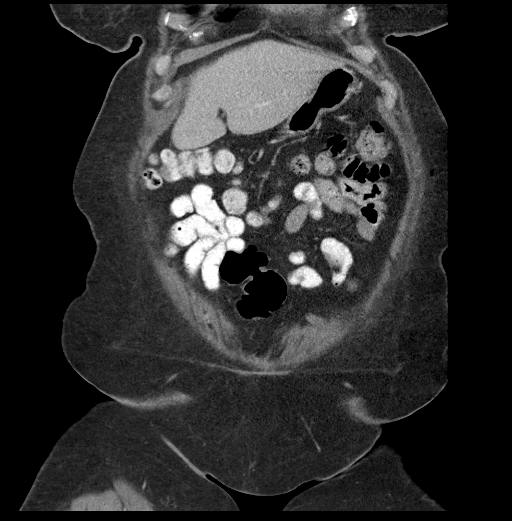
[im 59/117  soft-tissue]
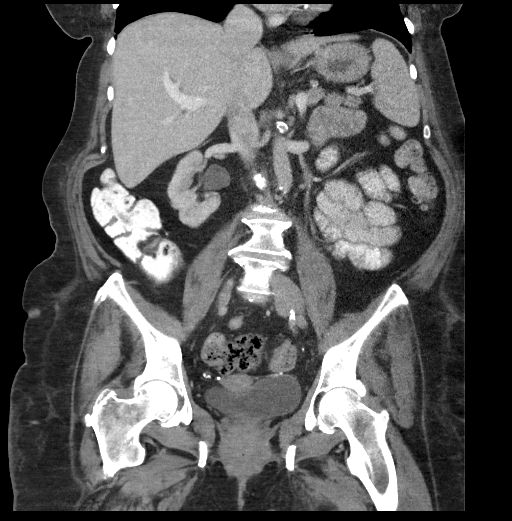
[im 88/117  soft-tissue]
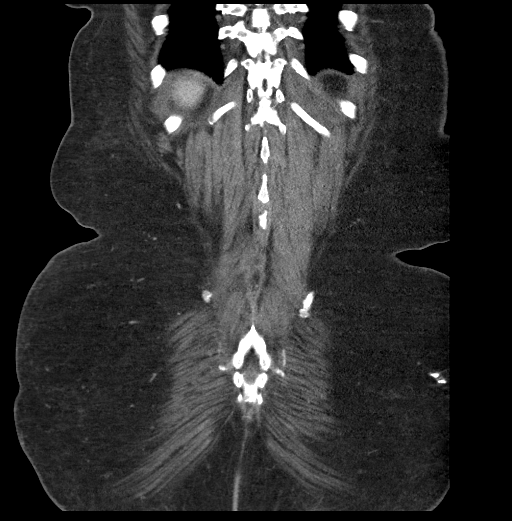

[13 of 46 positions shown; findings below may reference images not displayed]

FINDINGS: Lower chest: No acute abnormality. Mitral annular calcifications.
Normal size heart. No pericardial effusion.

Hepatobiliary: No suspicious hepatic lesions. Gallbladder is
unremarkable. No biliary ductal dilation.

Pancreas: Unremarkable

Spleen: Unremarkable

Adrenals/Urinary Tract: Bilateral adrenal glands are unremarkable.
No suspicious renal masses. No filling defect visualized within the
opacified portions of the collecting system and ureter on delayed
imaging. Urinary bladder is unremarkable for degree of distension.

Stomach/Bowel: Small gastric diverticulum otherwise the stomach is
unremarkable for degree of distension. Normal positioning of the
duodenum/ligament of Treitz. No suspicious small bowel wall
thickening or dilation. The terminal ileum and appendix appear
unremarkable. No suspicious colonic wall thickening or mass like
lesions. Scattered colonic diverticulosis without findings of acute
diverticulitis.

Vascular/Lymphatic: Aortic atherosclerosis. No enlarged abdominal or
pelvic lymph nodes.

Reproductive: Fluid and gas within the endometrial canal and vagina.

Left adnexa is unremarkable. 2 cm right adnexal cyst. No follow-up
imaging recommended. Note: This recommendation does not apply to
premenarchal patients and to those with increased risk (genetic,
family history, elevated tumor markers or other high-risk factors)
of ovarian cancer. Reference: JACR [DATE]):[id number]

Other: Sequela of subcutaneous injections. No abdominopelvic
ascites.

Musculoskeletal: Multilevel degenerative change of the spine and
degenerative changes bilateral hips. No acute osseous abnormality.
IMPRESSION: 1. No acute abdominopelvic findings. Normal appendix.
2. Scattered colonic diverticulosis without findings of acute
diverticulitis.
3. Aortic atherosclerosis.  Aortic Atherosclerosis ([6O]-[6O]).

## 2020-12-09 MED ORDER — IOHEXOL 300 MG/ML  SOLN
100.0000 mL | Freq: Once | INTRAMUSCULAR | Status: AC | PRN
  Administered 2020-12-09: 100 mL via INTRAVENOUS

## 2020-12-10 ENCOUNTER — Other Ambulatory Visit: Payer: Self-pay

## 2020-12-10 ENCOUNTER — Other Ambulatory Visit (HOSPITAL_COMMUNITY): Payer: Medicare Other

## 2020-12-10 ENCOUNTER — Ambulatory Visit (INDEPENDENT_AMBULATORY_CARE_PROVIDER_SITE_OTHER): Payer: Medicare Other | Admitting: "Endocrinology

## 2020-12-10 ENCOUNTER — Ambulatory Visit: Payer: Medicare Other | Admitting: "Endocrinology

## 2020-12-10 ENCOUNTER — Encounter: Payer: Self-pay | Admitting: "Endocrinology

## 2020-12-10 VITALS — BP 128/66 | HR 92 | Ht 64.0 in | Wt 204.6 lb

## 2020-12-10 DIAGNOSIS — R350 Frequency of micturition: Secondary | ICD-10-CM | POA: Diagnosis not present

## 2020-12-10 DIAGNOSIS — R5383 Other fatigue: Secondary | ICD-10-CM | POA: Diagnosis not present

## 2020-12-10 DIAGNOSIS — W1830XA Fall on same level, unspecified, initial encounter: Secondary | ICD-10-CM | POA: Diagnosis not present

## 2020-12-10 DIAGNOSIS — E1165 Type 2 diabetes mellitus with hyperglycemia: Secondary | ICD-10-CM | POA: Diagnosis not present

## 2020-12-10 DIAGNOSIS — M25562 Pain in left knee: Secondary | ICD-10-CM | POA: Diagnosis not present

## 2020-12-10 DIAGNOSIS — R519 Headache, unspecified: Secondary | ICD-10-CM | POA: Diagnosis not present

## 2020-12-10 DIAGNOSIS — E039 Hypothyroidism, unspecified: Secondary | ICD-10-CM | POA: Diagnosis not present

## 2020-12-10 DIAGNOSIS — M25531 Pain in right wrist: Secondary | ICD-10-CM | POA: Diagnosis not present

## 2020-12-10 MED ORDER — TRULICITY 0.75 MG/0.5ML ~~LOC~~ SOAJ: 0.7500 mg | SUBCUTANEOUS | 2 refills | Status: DC

## 2020-12-10 MED ORDER — FREESTYLE LIBRE 2 READER DEVI: 0 refills | Status: DC

## 2020-12-10 MED ORDER — FREESTYLE LIBRE 2 SENSOR MISC: 1.0000 | 3 refills | Status: DC

## 2020-12-10 NOTE — Patient Instructions (Signed)

## 2020-12-10 NOTE — Progress Notes (Signed)
12/10/2020, 3:46 PM   Endocrinology follow-up note   Subjective:    Patient ID: Laura [name], female    DOB: [birth year].  Laura [name] was seen since 2019 for management of diabetes, also seen during her last visit in relation to her steroid exposure to rule out adrenal insufficiency.  PMD: [name], MD.   Past Medical History:  Diagnosis Date  . Asthma   . Atrial fibrillation ([place])   . Bipolar disorder ([place])   . Bronchiectasis ([employer])   . CHF (congestive heart failure) ([place])   . Complication of anesthesia    "my Dr in [place] told me I had an allergic reaction to the combination of the pain meds and the anesthesia" she does not remember what happened but "I eneded up in the ICU for 5 days".  . H/O congenital atrial septal defect (ASD) repair   . Hypogammaglobulinemia ([place])   . Hypothyroid   . 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 Neuropathy   . Osteoarthritis   . PTSD (post-traumatic stress disorder)   . Recurrent sinusitis   . Short-term memory loss   . Sleep apnea   . TIA (transient ischemic attack)   . Tremors of nervous system    seeing PCP for this.  . Type 2 diabetes mellitus ([place])   . Urticaria   . Wound infection    Past Surgical History:  Procedure Laterality Date  . 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 CARDIAC SURGERY    . CARDIOVERSION    . CARDIOVERSION N/A 08/29/2018   Procedure: CARDIOVERSION;  Surgeon: Arnoldo Lenis, MD;  [facility];  Service: Endoscopy;  Laterality: N/A;  . [place]  . LEFT HEART CATH AND CORONARY ANGIOGRAPHY N/A 08/30/2019   Procedure: LEFT HEART CATH AND CORONARY ANGIOGRAPHY;  Surgeon: [name], MD;  Location: [facility];  Service: Cardiovascular;  Laterality: N/A;  . NASAL SINUS SURGERY  2017   Social History   Socioeconomic History  . Marital status: Widowed    Spouse name: Not on file  . Number of  children: Not on file  . Years of education: Not on file  . Highest education level: Not on file  Occupational History  . Not on file  Tobacco Use  . Smoking status: Former Smoker    Packs/day: 1.00    Years: 28.00    Pack years: 28.00    Types: Cigarettes    Start date: 5    Quit date: 2008    Years since quitting: 14.1  . Smokeless tobacco: Never Used  Vaping Use  . Vaping Use: Never used  Substance and Sexual Activity  . Alcohol use: Not Currently  . Drug use: Not Currently  . Sexual activity: Yes    Birth control/protection: None  Other Topics Concern  . Not on file  Social History Narrative  . Not on file   Social Determinants of Health   Financial Resource Strain: Not on file  Food Insecurity: Not on file  Transportation Needs: Not on file  Physical Activity: Not on file  Stress: Not on file  Social Connections:  Not on file   Outpatient Encounter Medications as of 12/10/2020  Medication Sig  . Continuous Blood Gluc Receiver (FREESTYLE LIBRE 2 READER) DEVI As directed  . Continuous Blood Gluc Sensor (FREESTYLE LIBRE 2 SENSOR) MISC 1 Piece by Does not apply route every 14 (fourteen) days.  Marland Kitchen acetaminophen (TYLENOL) 325 MG tablet Take 2 tablets (650 mg total) by mouth every 6 (six) hours as needed for mild pain, fever or headache.  . albuterol (PROVENTIL) (2.5 MG/3ML) 0.083% nebulizer solution Take 3 mLs (2.5 mg total) by nebulization every 4 (four) hours as needed for wheezing or shortness of breath.  Marland Kitchen albuterol (VENTOLIN HFA) 108 (90 Base) MCG/ACT inhaler INHALE 2 PUFFS INTO THE LUNGS EVERY 6 HOURS AS NEEDED FOR WHEEZING OR SHORTNESS OF BREATH  . apixaban (ELIQUIS) 5 MG TABS tablet Take 5 mg by mouth 2 (two) times daily.  Marland Kitchen atorvastatin (LIPITOR) 10 MG tablet Take 10 mg by mouth every evening.   . BD PEN NEEDLE NANO 2ND GEN 32G X 4 MM MISC SMARTSIG:Injection 4 Times Daily  . Benralizumab (FASENRA PEN) 30 MG/ML SOAJ Inject 30 mg into the skin every 8 (eight) weeks.   . Budeson-Glycopyrrol-Formoterol (BREZTRI AEROSPHERE) 160-9-4.8 MCG/ACT AERO Inhale 1 puff into the lungs 2 (two) times daily.  . Cholecalciferol (VITAMIN D3) 125 MCG (5000 UT) CAPS Take 1 capsule (5,000 Units total) by mouth daily.  Marland Kitchen diltiazem (CARDIZEM CD) 360 MG 24 hr capsule Take 1 capsule (360 mg total) by mouth daily.  . divalproex (DEPAKOTE ER) 500 MG 24 hr tablet 500 mg in AM, 1000 mg at night  . Dulaglutide 1.5 MG/0.5ML SOPN Inject 1.5 mg into the skin every Sunday.   Marland Kitchen EPINEPHrine 0.3 mg/0.3 mL IJ SOAJ injection   . gabapentin (NEURONTIN) 600 MG tablet Take 300 mg by mouth 3 (three) times daily.   Marland Kitchen HUMALOG KWIKPEN 100 UNIT/ML KwikPen Inject 5-11 Units into the skin 3 (three) times daily before meals. Sliding scale.  . hydrALAZINE (APRESOLINE) 25 MG tablet Take 25 mg by mouth 3 (three) times daily.  . hydrOXYzine (ATARAX/VISTARIL) 25 MG tablet Take 1 tablet (25 mg total) by mouth every 6 (six) hours as needed for anxiety or nausea.  . Immune Globulin, Human, 4 GM/20ML SOLN Inject 100 mLs into the skin every 14 (fourteen) days.  Marland Kitchen LANTUS SOLOSTAR 100 UNIT/ML Solostar Pen Inject 45 Units into the skin.   Marland Kitchen levothyroxine (SYNTHROID, LEVOTHROID) 112 MCG tablet Take 112 mcg by mouth daily before breakfast.  . Magnesium Oxide 400 MG CAPS Take 1 capsule (400 mg total) by mouth daily.  . Melatonin 3 MG TABS Take 3 mg by mouth at bedtime.  . Nebulizer MISC Nebulizer tubing kit  . omeprazole (PRILOSEC) 20 MG capsule TAKE 1 CAPSULE(20 MG) BY MOUTH DAILY (Patient taking differently: As needed)  . ondansetron (ZOFRAN) 4 MG tablet Take 1 tablet (4 mg total) by mouth every 8 (eight) hours as needed for nausea or vomiting.  Glory Rosebush ULTRA test strip USE TO CHECK BLOOD SUGAR FOUR TIMES DAILY  . oxyCODONE-acetaminophen (PERCOCET) 5-325 MG tablet Take 1-2 tablets by mouth every 6 (six) hours as needed.  . potassium chloride SA (KLOR-CON) 20 MEQ tablet Take 20 mEq by mouth daily.  . Tiotropium  Bromide Monohydrate (SPIRIVA RESPIMAT) 1.25 MCG/ACT AERS Inhale 2 puffs into the lungs daily.  Marland Kitchen topiramate (TOPAMAX) 100 MG tablet Take 1 tablet (100 mg total) by mouth 2 (two) times daily.  Marland Kitchen torsemide (DEMADEX) 20 MG tablet Take 2  tablets (40 mg total) by mouth daily.   Facility-Administered Encounter Medications as of 12/10/2020  Medication  . Benralizumab SOSY 30 mg    ALLERGIES: Allergies  Allergen Reactions  . Ativan [Lorazepam] Hives  . Phenergan [Promethazine Hcl] Hives    VACCINATION STATUS: Immunization History  Administered Date(s) Administered  . Influenza Split 08/24/2018  . Influenza Whole 07/15/2019  . Influenza,inj,Quad PF,6+ Mos 07/10/2017  . Moderna Sars-Covid-2 Vaccination 12/31/2019, 01/30/2020  . Pneumococcal-Unspecified 10/10/2012    Diabetes She presents for her follow-up diabetic visit. She has type 2 diabetes mellitus. Onset time: She was diagnosed at approximate age of 76 years, stated by gestational diabetes with all 3 of her pregnancies in her 65s. Her disease course has been worsening (Since her last visit, she fell and injured herself.  CT scan of the head did not show any intracranial bleeding.Marland Kitchen). There are no hypoglycemic associated symptoms. Pertinent negatives for hypoglycemia include no confusion, headaches, pallor or seizures. Associated symptoms include fatigue. Pertinent negatives for diabetes include no blurred vision, no chest pain, no polydipsia, no polyphagia and no polyuria. There are no hypoglycemic complications. Symptoms are worsening. Diabetic complications include a CVA. (She has history of open heart surgery for ASD in 1965.) Risk factors for coronary artery disease include dyslipidemia, diabetes mellitus, family history, obesity, hypertension, post-menopausal, sedentary lifestyle and tobacco exposure. Current diabetic treatments: She is currently on Lantus 45 units nightly, Humalog 7 units 3 times daily AC, Metformin 1000 g p.o. twice daily,  Trulicity 1.5 mg subcutaneou 50 units nig. Her weight is fluctuating minimally. She is following a generally unhealthy diet. When asked about meal planning, she reported none. She has not had a previous visit with a dietitian. She rarely (She has medical problem with bronchiectasis for which she gets treated with steroids on and off.) participates in exercise. Her home blood glucose trend is fluctuating minimally. (She was advised to test blood glucose 4 times a day, however she presents with a meter showing 1 reading per day on average 194 in the last 7 days, 192 in the last 14 days 171 the last 30 days.   Her recent A1c was 7.7% in January 2022, increasing from 6.6% in 2020. She did not document any hypoglycemia.) An ACE inhibitor/angiotensin II receptor blocker is being taken.  Hyperlipidemia This is a chronic problem. The current episode started more than 1 year ago. Exacerbating diseases include diabetes and obesity. Associated symptoms include myalgias. Pertinent negatives include no chest pain or shortness of breath. Current antihyperlipidemic treatment includes statins. Risk factors for coronary artery disease include dyslipidemia, diabetes mellitus, family history, obesity, hypertension, a sedentary lifestyle and post-menopausal.  Hypertension This is a chronic problem. The current episode started more than 1 year ago. The problem is uncontrolled. Pertinent negatives include no blurred vision, chest pain, headaches, palpitations or shortness of breath. Risk factors for coronary artery disease include dyslipidemia, diabetes mellitus, obesity, sedentary lifestyle and smoking/tobacco exposure. Past treatments include ACE inhibitors. Hypertensive end-organ damage includes CVA.  Other The current episode started more than 1 month ago. The problem occurs constantly. Progression since onset: Patient reports that she did have exposure to steroids related to back pain on 2 separate occasions between November  2000 21 and 2022.  Last exposure was related to her COVID infection.  She reports that she developed fatigueced that she developed fatigue. Associated symptoms include fatigue and myalgias. Pertinent negatives include no arthralgias, chest pain, chills, coughing, fever, headaches, nausea, rash or vomiting. She has tried nothing  for the symptoms. The treatment provided no relief.      Objective:    BP 128/66   Pulse 92   Ht 5\' 4"  (1.626 m)   Wt 204 lb 9.6 oz (92.8 kg)   BMI 35.12 kg/m   Wt Readings from Last 3 Encounters:  12/10/20 204 lb 9.6 oz (92.8 kg)  12/08/20 208 lb 6.4 oz (94.5 kg)  12/07/20 204 lb (92.5 kg)    + Bruise and hematoma on her forehead and left periorbital area.  This is related to her recent fall.  CT scan of the head did not show any intracranial bleeding.  Has no recent CMP, A1c, thyroid function test, lipid panel to review. Recent Results (from the past 2160 hour(s))  VITAMIN D 25 Hydroxy (Vit-D Deficiency, Fractures)     Status: None   Collection Time: 11/02/20 12:00 AM  Result Value Ref Range   Vit D, 25-Hydroxy 16.5   Basic metabolic panel     Status: None   Collection Time: 11/02/20 12:00 AM  Result Value Ref Range   Glucose 138    BUN 19 4 - 21   Creatinine 1.1 0.5 - 1.1   Potassium 4.2 3.4 - 5.3   Sodium 140 137 - 147   Chloride 104 99 - 108  Comprehensive metabolic panel     Status: Abnormal   Collection Time: 11/02/20 12:00 AM  Result Value Ref Range   Globulin 2.6    GFR calc Af Amer 63    GFR calc non Af Amer 54    Calcium 8.4 (A) 8.7 - 10.7   Albumin 4.0 3.5 - 5.0  Lipid panel     Status: None   Collection Time: 11/02/20 12:00 AM  Result Value Ref Range   Triglycerides 154 40 - 160   Cholesterol 142 0 - 200   HDL 39 35 - 70   LDL Cholesterol 76   Hepatic function panel     Status: None   Collection Time: 11/02/20 12:00 AM  Result Value Ref Range   Alkaline Phosphatase 69 25 - 125   ALT 8 7 - 35   AST 15 13 - 35   Bilirubin,  Total 0.2   Vitamin B12     Status: None   Collection Time: 11/02/20 12:00 AM  Result Value Ref Range   Vitamin B-12 340   Hemoglobin A1c     Status: None   Collection Time: 11/02/20 12:00 AM  Result Value Ref Range   Hemoglobin A1C 7.7   ECHOCARDIOGRAM COMPLETE     Status: None   Collection Time: 11/12/20 11:17 AM  Result Value Ref Range   Area-P 1/2 3.99 cm2   S' Lateral 2.80 cm  ACTH stimulation, 3 time points     Status: None   Collection Time: 12/03/20  8:33 AM  Result Value Ref Range   Cortisol, Base 7.6 ug/dL    Comment: NO NORMAL RANGE ESTABLISHED FOR THIS TEST   Cortisol, 30 Min 23.4 ug/dL   Cortisol, 60 Min 12/05/20 ug/dL    Comment: Performed at [facility], [address], [place], [place]  Basic metabolic panel     Status: Abnormal   Collection Time: 12/07/20  1:55 PM  Result Value Ref Range   Sodium 139 135 - 145 mmol/L   Potassium 3.6 3.5 - 5.1 mmol/L   Chloride 108 98 - 111 mmol/L   CO2 21 (L) 22 - 32 mmol/L   Glucose,  Bld 70 70 - 99 mg/dL    Comment: Glucose reference range applies only to samples taken after fasting for at least 8 hours.   BUN 13 6 - 20 mg/dL   Creatinine, Ser 0.64 0.44 - 1.00 mg/dL   Calcium 9.0 8.9 - 10.3 mg/dL   GFR, Estimated >60 >60 mL/min    Comment: (NOTE) Calculated using the CKD-EPI Creatinine Equation (2021)    Anion gap 10 5 - 15    Comment: Performed at [facility] [address], [place], [place] [address]  Protime-INR     Status: None   Collection Time: 12/07/20  1:55 PM  Result Value Ref Range   Prothrombin Time 13.8 11.4 - 15.2 seconds   INR 1.1 0.8 - 1.2    Comment: (NOTE) INR goal varies based on device and disease states. Performed at [facility], [place] [address] [place], [place] [address]   CBC     Status: None   Collection Time: 12/07/20  1:55 PM  Result Value Ref Range   WBC 5.9 4.0 - 10.5 K/uL   RBC 3.91 3.87 - 5.11 MIL/uL   Hemoglobin 12.8 12.0 - 15.0 g/dL   HCT 39.1 36.0 -  46.0 %   MCV 100.0 80.0 - 100.0 fL   MCH 32.7 26.0 - 34.0 pg   MCHC 32.7 30.0 - 36.0 g/dL   RDW 13.9 11.5 - 15.5 %   Platelets 182 150 - 400 K/uL   nRBC 0.0 0.0 - 0.2 %    Comment: Performed at [facility], [place] [address], [place], [place] [address]  Type and screen [place]     Status: None   Collection Time: 12/07/20  1:55 PM  Result Value Ref Range   ABO/RH(D) O NEG    Antibody Screen NEG    Sample Expiration      12/10/2020,2359 Performed at [facility], [place] [address] [place], [place] [address]   Urinalysis, Routine w reflex microscopic     Status: Abnormal   Collection Time: 12/08/20  1:15 PM  Result Value Ref Range   Color, Urine COLORLESS (A) YELLOW   APPearance CLEAR CLEAR   Specific Gravity, Urine 1.006 1.005 - 1.030   pH 5.0 5.0 - 8.0   Glucose, UA NEGATIVE NEGATIVE mg/dL   Hgb urine dipstick NEGATIVE NEGATIVE   Bilirubin Urine NEGATIVE NEGATIVE   Ketones, ur NEGATIVE NEGATIVE mg/dL   Protein, ur NEGATIVE NEGATIVE mg/dL   Nitrite NEGATIVE NEGATIVE   Leukocytes,Ua NEGATIVE NEGATIVE    Comment: Performed at [facility], [address] [place], [place] [address]  Culture, Urine     Status: None   Collection Time: 12/08/20  1:16 PM   Specimen: Urine, Clean Catch  Result Value Ref Range   Specimen Description      URINE, CLEAN CATCH Performed at [facility], [address] [place], [place] [address]    Special Requests      NONE Performed at [facility], [address] [place], [place] [address]    Culture      NO GROWTH Performed at [facility], [place] [address] [place], [place] [address]    Report Status 12/09/2020 FINAL      Assessment & Plan:     1.  Fatigue: Likely multifactorial including sleep apnea.  In relation to her recent steroid exposure, she was assessed for adrenal sufficiency.  Her ACTH stimulation test is consistent with  appropriate response, rules out adrenal insufficiency.   She would not  need intervention with steroids for now.  2. DM type 2 causing vascular disease ([place])  She was advised to test blood glucose 4 times a day, however she presents with a meter showing 1 reading per day on average 194 in the last 7 days, 192 in the last 14 days 171 the last 30 days.   Her recent A1c was 7.7% in January 2022, increasing from 6.6% in 2020. She did not document any hypoglycemia.  -her diabetes is complicated by CVA, history of open heart surgery for ASD, obesity/sedentary life, history of chronic smoking with bronchiectasis and Laura [name] remains at a high risk for more acute and chronic complications which include CAD, CVA, CKD, retinopathy, and neuropathy. These are all discussed in detail with the patient.  - I have counseled her on diet management and weight loss, by adopting a carbohydrate restricted/protein rich diet. - she acknowledges that there is a room for improvement in her food and drink choices. - Suggestion is made for her to avoid simple carbohydrates  from her diet including Cakes, Sweet Desserts, Ice Cream, Soda (diet and regular), Sweet Tea, Candies, Chips, Cookies, Store Bought Juices, Alcohol in Excess of  1-2 drinks a day, Artificial Sweeteners,  Coffee Creamer, and "Sugar-free" Products, Lemonade. This will help patient to have more stable blood glucose profile and potentially avoid unintended weight gain.   - she is advised to stick to a routine mealtimes to eat 3 meals  a day and avoid unnecessary snacks ( to snack only to correct hypoglycemia).   - she will be scheduled with Jearld Fenton, RDN, CDE for individualized diabetes education.  - I have approached her with the following individualized plan to manage diabetes and patient agrees:   -She is promising to continue on her significant change in her diet and exercise regimen as much as possible.   -She has not monitored enough to optimize her insulin treatment. -She is approached again and advised to  continue Lantus 45 units nightly, adjusted her Humalog to 5-11 units 3 times daily AC for P[place]al blood glucose readings above 90 mg/day, associated with monitoring of blood glucose 4 times a day-daily before breakfast, lunch, supper, and at bedtime. - Patient is warned not to take insulin without proper monitoring per orders.  -Patient is encouraged to call clinic for blood glucose levels less than 70 or above 200 mg /dl. -This patient would benefit from a CGM.  I discussed and prescribed the freestyle libre device for her. -Due to her vague GI concerns, she is advised to stay off of Metformin for now.    -She is advised to continue Trulicity 1.5 mg subcutaneously weekly, her next refill will be low-dose 0.75 mg subcutaneously weekly.  - Patient specific target  A1c;  LDL, HDL, Triglycerides,  were discussed in detail.  3) BP/HTN: Her blood pressure is controlled to target.  She advised to continue her current blood pressure medications including hydralazine 25 mg 3 times daily.     4) Lipids/HPL: Her recent lipid panel showed LDL of 76, patient is on atorvastatin 10 mg p.o. nightly.  Advised to continue, side effects precautions discussed with her.     5)  Weight/Diet: Her BMI is 36.7, will benefit from consult with dietitian.  I discussed and arranged for referral.     6) hypothyroidism-long-standing, etiology unclear, she has multiple family members with thyroid dysfunctions. -She does not have recent thyroid function  tests.  She remains on levothyroxine 112 mcg p.o. daily before breakfast.     - We discussed about the correct intake of her thyroid hormone, on empty stomach at fasting, with water, separated by at least 30 minutes from breakfast and other medications,  and separated by more than 4 hours from calcium, iron, multivitamins, acid reflux medications (PPIs). -Patient is made aware of the fact that thyroid hormone replacement is needed for life, dose to be adjusted by periodic  monitoring of thyroid function tests.   7) vitamin D deficiency: Her vitamin D 16.5.  She is currently on  vitamin D supplements including vitamin D3 5000 units daily.   8) Chronic Care/Health Maintenance:  -she  is on  Statin medications and  is encouraged to continue to follow up with Ophthalmology, Dentist,  Podiatrist at least yearly or according to recommendations, and advised to  stay away from smoking. I have recommended yearly flu vaccine and pneumonia vaccination at least every 5 years;  and  sleep for at least 7 hours a day.  - I advised patient to maintain close follow up with [name], MD for primary care needs. - Time spent on this patient care encounter:  40 min, of which > 50% was spent in  counseling and the rest reviewing her blood glucose logs , discussing her hypoglycemia and hyperglycemia episodes, reviewing her current and  previous labs / studies  ( including abstraction from other facilities) and medications  doses and developing a  long term treatment plan and documenting her care.   Please refer to Patient Instructions for Blood Glucose Monitoring and Insulin/Medications Dosing Guide"  in media tab for additional information. Please  also refer to " Patient Self Inventory" in the Media  tab for reviewed elements of pertinent patient history.  Laura [name] participated in the discussions, expressed understanding, and voiced agreement with the above plans.  All questions were answered to her satisfaction. she is encouraged to contact clinic should she have any questions or concerns prior to her return visit.   Follow up plan: - Return in about 9 weeks (around 02/11/2021) for F/U with Meter and Logs Only - no Labs.  Glade Lloyd, MD [facility] [address] [place], [place] [address] Phone: [phone]  Fax: [phone]    12/10/2020, 3:46 PM  This note was partially dictated with voice recognition software.  Similar sounding words can be transcribed inadequately or may not  be corrected upon review.

## 2020-12-11 ENCOUNTER — Encounter (HOSPITAL_COMMUNITY): Payer: Self-pay

## 2020-12-11 ENCOUNTER — Ambulatory Visit (HOSPITAL_COMMUNITY): Payer: Medicare Other | Attending: Orthopedic Surgery

## 2020-12-11 ENCOUNTER — Telehealth: Payer: Self-pay

## 2020-12-11 DIAGNOSIS — M545 Low back pain, unspecified: Secondary | ICD-10-CM | POA: Diagnosis not present

## 2020-12-11 DIAGNOSIS — M25552 Pain in left hip: Secondary | ICD-10-CM | POA: Diagnosis not present

## 2020-12-11 DIAGNOSIS — M79605 Pain in left leg: Secondary | ICD-10-CM | POA: Diagnosis not present

## 2020-12-11 NOTE — Therapy (Signed)
[place], [place], [address] Phone: [phone]   Fax:  [phone]  Physical Therapy Treatment  Patient Details  Name: Laura [name] MRN: [id number] Date of Birth: 1961-03-02 Referring Provider (PT): Matthew Saras [place]   Encounter Date: 12/11/2020   PT End of Session - 12/11/20 1348    Visit Number 2    Number of Visits 12    Date for PT Re-Evaluation 01/14/21    Authorization Type Medicare A, Medcaid 2nd    Progress Note Due on Visit 10    PT Start Time 1312    PT Stop Time 1358    PT Time Calculation (min) 46 min    Activity Tolerance Patient limited by pain    Behavior During Therapy Anxious;WFL for tasks assessed/performed           Past Medical History:  Diagnosis Date  . Asthma   . Atrial fibrillation ([place])   . Bipolar disorder ([place])   . Bronchiectasis ([place])   . CHF (congestive heart failure) ([place])   . Complication of anesthesia    "my Dr in [place] told me I had an allergic reaction to the combination of the pain meds and the anesthesia" she does not remember what happened but "I eneded up in the ICU for 5 days".  . H/O congenital atrial septal defect (ASD) repair   . Hypogammaglobulinemia ([place])   . Hypothyroid   . 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 Neuropathy   . Osteoarthritis   . PTSD (post-traumatic stress disorder)   . Recurrent sinusitis   . Short-term memory loss   . Sleep apnea   . TIA (transient ischemic attack)   . Tremors of nervous system    seeing PCP for this.  . Type 2 diabetes mellitus ([place])   . Urticaria   . Wound infection     Past Surgical History:  Procedure Laterality Date  . 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 CARDIAC SURGERY    . CARDIOVERSION    . CARDIOVERSION N/A 08/29/2018   Procedure: CARDIOVERSION;  Surgeon: Arnoldo Lenis, MD;  [facility];  Service: Endoscopy;  Laterality: N/A;  . [place]  . LEFT HEART CATH AND CORONARY  ANGIOGRAPHY N/A 08/30/2019   Procedure: LEFT HEART CATH AND CORONARY ANGIOGRAPHY;  Surgeon: [name], MD;  Location: [facility];  Service: Cardiovascular;  Laterality: N/A;  . NASAL SINUS SURGERY  2017    There were no vitals filed for this visit.   Subjective Assessment - 12/11/20 1315    Subjective Pt reports she had a follow up with MD following recent fall.  Reports they found a cyst on ovary that may be related to LBP  Current pain scale 5/10 LBP, more Rt than Lt.  Reports increased incontinence.  Reports no more sharp pain LBP, more achey pain.  Feels the stretches have helped since initial eval.    Pertinent History DB, PTSD, SHF, BBipolar, tremors, depression    Patient Stated Goals to be able to walk a mile    Currently in Pain? Yes    Pain Score 5     Pain Location Back    Pain Orientation Lower;Right    Pain Descriptors / Indicators Aching;Dull    Pain Type Acute pain    Pain Onset More than a month ago    Pain Frequency Constant    Aggravating Factors  heat, sitting, doing anything  for prolonged periods of time, excessive pressure    Pain Relieving Factors tylenol, ice, pressure.    Effect of Pain on Daily Activities Limits              [facility] PT Assessment - 12/11/20 0001      Assessment   Medical Diagnosis neck pain wiht radiculopathy and low back pain    Referring Provider (PT) Matthew Saras PA    Prior Therapy yes for current issue      ROM / Strength   AROM / PROM / Strength Strength      Strength   Strength Assessment Site Shoulder;Hip;Lumbar;Cervical    Right/Left Shoulder Right;Left    Right Shoulder Flexion 4/5   limited by pain   Right Shoulder ABduction 3+/5   limited ROM, limited by pain   Right Shoulder Internal Rotation 4+/5   limited by pain   Right Shoulder External Rotation 4/5   limited by pain   Left Shoulder Flexion 4/5   limited by pain, limited ROM   Left Shoulder ABduction 4-/5   limited by pain, limited ROM    Left Shoulder Internal Rotation 4+/5   4+   Left Shoulder External Rotation 4-/5   limited by pain   Right Hip Flexion 3+/5   decreased range, limited by pain   Right Hip Extension 3-/5   complete in sidelying   Right Hip ABduction 3+/5    Left Hip Extension 3-/5   complete in sidelying   Cervical Flexion 4/5    Cervical Extension 4-/5    Cervical - Right Side Bend 4/5    Cervical - Left Side Bend 4/5    Cervical - Right Rotation 4/5    Cervical - Left Rotation 4/5                         [facility] Adult PT Treatment/Exercise - 12/11/20 0001      Lumbar Exercises: Standing   Other Standing Lumbar Exercises lumbar extension    Other Standing Lumbar Exercises lumbar side bend and rotation      Lumbar Exercises: Seated   Other Seated Lumbar Exercises long exhale breathing x 25min; paired abdominal sets 3" holds (difficult)    Other Seated Lumbar Exercises Sitting posture with lumbar supporty                  PT Education - 12/11/20 1420    Education Details Reviewed goals, educated importance of HEP compliance for maximal benefits, importance of abdominal strengthening for incontience and lumbar support, purpose with MMT    Person(s) Educated Patient    Methods Explanation    Comprehension Verbalized understanding            PT Short Term Goals - 12/03/20 1052      PT SHORT TERM GOAL #1   Title Patient will be independent with initial HEP and self-management strategies to improve functional outcomes    Time 3    Period Weeks    Status New    Target Date 12/24/20      PT SHORT TERM GOAL #2   Title Patient will be able to demonstrate at least 150 degrees of left active shoulder ROM while sitting to demosntrate improved shoulder mobility.    Time 3    Period Weeks    Status New    Target Date 12/24/20      PT SHORT TERM GOAL #3   Title Patient will report  at least 25% improvement in overall symptoms and/or function to demonstrate improved functional  mobility             PT Long Term Goals - 12/03/20 1052      PT LONG TERM GOAL #1   Title Patient will improve on FOTO score to meet predicted outcomes to demonstrate improved functional mobility.    Time 6    Period Weeks    Status New    Target Date 01/14/21      PT LONG TERM GOAL #2   Title Patient will report at least 50% overall improvement in subjective complaint to indicate improvement in ability to perform ADLs.    Time 6    Period Weeks    Status New    Target Date 01/14/21      PT LONG TERM GOAL #3   Title Patient will be able to walk a mile to demonstrate improved walking endurance    Time 6    Period Weeks    Status New    Target Date 01/14/21                 Plan - 12/11/20 1348    Clinical Impression Statement Reviewed goals,educated importance of HEP compliance for maximal beneftis.  MMT complete for neck, shoulder and LE.  Pt limited by pain wiht majority of movements, supine position, and noted musclature guarding wiht mobility exercises.  Pt educated importance of deep breathing for calming CNS.  Educated importance of posture to reduce stress on spinal muscature and importance of abdominal strengthen to support back.  Pt with difficulty with abdominal activation, verbal and tactile cueing.  Also discussed assistance with lumbar roll to improve posture.    Personal Factors and Comorbidities Comorbidity 3+;Comorbidity 2;Comorbidity 1    Comorbidities COPD, Asthma, HTN, A-fib, DM    Stability/Clinical Decision Making Evolving/Moderate complexity    Clinical Decision Making Moderate    Rehab Potential Good    PT Frequency 2x / week    PT Duration 6 weeks    PT Treatment/Interventions ADLs/Self Care Home Management;Aquatic Therapy;Biofeedback;Canalith Repostioning;Cryotherapy;Electrical Stimulation;Iontophoresis 4mg /ml Dexamethasone;Moist Heat;Traction;Balance training;Manual lymph drainage;Manual techniques;Vestibular;Vasopneumatic  Device;Taping;Splinting;Orthotic Fit/Training;Energy conservation;Stair training;Functional mobility training;Therapeutic activities;Therapeutic exercise;Gait training;DME Instruction;Patient/family education;Passive range of motion;Dry needling;Joint Manipulations;Spinal Manipulations;Compression bandaging;Visual/perceptual remediation/compensation;Scar mobilization;Neuromuscular re-education;Ultrasound;Parrafin;Fluidtherapy;Contrast Bath    PT Next Visit Plan focus on mobility, assess strength in core/back, focus on lumbar spine but incorporate upper extremity and neck    PT Home Exercise Plan lumbar extension standing    Consulted and Agree with Plan of Care Patient           Patient will benefit from skilled therapeutic intervention in order to improve the following deficits and impairments:  Abnormal gait,Impaired flexibility,Postural dysfunction,Improper body mechanics,Decreased range of motion,Decreased balance,Decreased mobility,Difficulty walking,Pain,Increased fascial restricitons,Decreased strength,Decreased activity tolerance,Hypomobility  Visit Diagnosis: Low back pain, unspecified back pain laterality, unspecified chronicity, unspecified whether sciatica present  Pain in left hip  Pain in left leg     Problem List Patient Active Problem List   Diagnosis Date Noted  . Other fatigue 12/10/2020  . Abdominal pain, chronic, right lower quadrant 12/08/2020  . Abnormal weight loss 12/08/2020  . Poor appetite 12/08/2020  . Abnormal EKG   . Chronic diastolic (congestive) heart failure ([place]) 08/29/2019  . Shortness of breath 08/29/2019  . Bipolar disorder ([employer]) 08/10/2018  . Headache 08/10/2018  . H/O atrial flutter 08/10/2018  . OSA on CPAP 08/10/2018  . Anxiety 08/10/2018  . Uncontrolled type 2 diabetes mellitus  with hyperglycemia ([place]) 03/13/2018  . Hypothyroidism 03/13/2018  . Essential hypertension, benign 03/13/2018  . Mixed hyperlipidemia 03/13/2018  .  Hypogammaglobulinemia ([place]) 02/06/2018  . Non-allergic rhinitis 02/06/2018  . Severe persistent asthma, uncomplicated 18/36/7255  . Pulmonary nodules 02/06/2018  . Bronchiectasis without complication ([place]) 00/16/4290  . DM type 2 causing vascular disease ([place]) 02/06/2018   Ihor Austin, LPTA/CLT; CBIS [phone]  Aldona Lento 12/11/2020, 3:46 PM  [facility] [address] [place], [place], [address] Phone: [phone]   Fax:  [phone]  Name: Laura [name] MRN: [id number] Date of Birth: Dec 03, 1960

## 2020-12-11 NOTE — Telephone Encounter (Signed)
Spoke with pt. Pt called concerned about her imaging report. Pt went to her PCP and they pulled up results yesterday. They discussed results with pt. Pt is now concerned with what the next steps are. I explained that the provider hasn't reviewed the results yet.

## 2020-12-14 ENCOUNTER — Telehealth: Payer: Self-pay | Admitting: Internal Medicine

## 2020-12-14 ENCOUNTER — Ambulatory Visit (HOSPITAL_COMMUNITY): Payer: Medicare Other

## 2020-12-14 ENCOUNTER — Other Ambulatory Visit: Payer: Self-pay

## 2020-12-14 ENCOUNTER — Encounter (HOSPITAL_COMMUNITY): Payer: Self-pay

## 2020-12-14 ENCOUNTER — Encounter: Payer: Self-pay | Admitting: Gastroenterology

## 2020-12-14 ENCOUNTER — Telehealth: Payer: Self-pay | Admitting: "Endocrinology

## 2020-12-14 VITALS — BP 129/68 | HR 72

## 2020-12-14 DIAGNOSIS — M79605 Pain in left leg: Secondary | ICD-10-CM

## 2020-12-14 DIAGNOSIS — M545 Low back pain, unspecified: Secondary | ICD-10-CM | POA: Diagnosis not present

## 2020-12-14 DIAGNOSIS — M25552 Pain in left hip: Secondary | ICD-10-CM | POA: Diagnosis not present

## 2020-12-14 NOTE — Telephone Encounter (Signed)
Laura [name], I spoke to patient, see result note.

## 2020-12-14 NOTE — Therapy (Signed)
[place], [place], [address] Phone: [phone]   Fax:  [phone]  Physical Therapy Treatment  Patient Details  Name: Laura [name] MRN: [id number] Date of Birth: [birth year] Referring Provider (PT): Matthew Saras [place]   Encounter Date: 12/14/2020   PT End of Session - 12/14/20 1710    Visit Number 3    Number of Visits 12    Date for PT Re-Evaluation 01/14/21    Authorization Type Medicare A, Medcaid 2nd    Progress Note Due on Visit 10    PT Start Time 1619    PT Stop Time 1703    PT Time Calculation (min) 44 min    Activity Tolerance Patient tolerated treatment well;No increased pain    Behavior During Therapy Anxious;WFL for tasks assessed/performed           Past Medical History:  Diagnosis Date  . Asthma   . Atrial fibrillation ([place])   . Bipolar disorder ([place])   . Bronchiectasis ([place])   . CHF (congestive heart failure) ([place])   . Complication of anesthesia    "my Dr in [place] told me I had an allergic reaction to the combination of the pain meds and the anesthesia" she does not remember what happened but "I eneded up in the ICU for 5 days".  . H/O congenital atrial septal defect (ASD) repair   . Hypogammaglobulinemia ([place])   . Hypothyroid   . 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 Neuropathy   . Osteoarthritis   . PTSD (post-traumatic stress disorder)   . Recurrent sinusitis   . Short-term memory loss   . Sleep apnea   . TIA (transient ischemic attack)   . Tremors of nervous system    seeing PCP for this.  . Type 2 diabetes mellitus ([place])   . Urticaria   . Wound infection     Past Surgical History:  Procedure Laterality Date  . 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 CARDIAC SURGERY    . CARDIOVERSION    . CARDIOVERSION N/A 08/29/2018   Procedure: CARDIOVERSION;  Surgeon: Arnoldo Lenis, MD;  [facility];  Service: Endoscopy;  Laterality: N/A;  . [place]  . LEFT  HEART CATH AND CORONARY ANGIOGRAPHY N/A 08/30/2019   Procedure: LEFT HEART CATH AND CORONARY ANGIOGRAPHY;  Surgeon: [name], MD;  Location: [facility];  Service: Cardiovascular;  Laterality: N/A;  . NASAL SINUS SURGERY  2017    Vitals:   12/14/20 1622  BP: 129/68  Pulse: 72     Subjective Assessment - 12/14/20 1622    Subjective Pt stated she contacted MD reviewing catscan concerning stomach issues.  Pt reports she has began walking more around the house.  Feels she is a little wobbly standing, no reoprts of recent fall.  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    Patient Stated Goals to be able to walk a mile    Currently in Pain? Yes    Pain Score 3     Pain Location Back    Pain Orientation Lower;Upper    Pain Descriptors / Indicators Sore;Aching    Pain Type Acute pain    Pain Radiating Towards to front of stomach    Pain Onset More than a month ago    Pain Frequency Constant    Aggravating Factors  heat, sitting, doing anything for prolonged  periods of time, excessive pressure    Pain Relieving Factors tylenol, ice, pressure                             [facility] Adult PT Treatment/Exercise - 12/14/20 0001      Lumbar Exercises: Standing   Functional Squats 10 reps    Functional Squats Limitations 3D hip excursion; front of chair and verbal cueing for mechanics with squats    Other Standing Lumbar Exercises wall slides BUE 5x; back against wall with cervical retraction 10x 3"    Other Standing Lumbar Exercises tandem stance 2x 30", partial tandem stance paloff 4x 10; marching alternating 10x 3"                    PT Short Term Goals - 12/03/20 1052      PT SHORT TERM GOAL #1   Title Patient will be independent with initial HEP and self-management strategies to improve functional outcomes    Time 3    Period Weeks    Status New    Target Date 12/24/20      PT SHORT TERM  GOAL #2   Title Patient will be able to demonstrate at least 150 degrees of left active shoulder ROM while sitting to demosntrate improved shoulder mobility.    Time 3    Period Weeks    Status New    Target Date 12/24/20      PT SHORT TERM GOAL #3   Title Patient will report at least 25% improvement in overall symptoms and/or function to demonstrate improved functional mobility             PT Long Term Goals - 12/03/20 1052      PT LONG TERM GOAL #1   Title Patient will improve on FOTO score to meet predicted outcomes to demonstrate improved functional mobility.    Time 6    Period Weeks    Status New    Target Date 01/14/21      PT LONG TERM GOAL #2   Title Patient will report at least 50% overall improvement in subjective complaint to indicate improvement in ability to perform ADLs.    Time 6    Period Weeks    Status New    Target Date 01/14/21      PT LONG TERM GOAL #3   Title Patient will be able to walk a mile to demonstrate improved walking endurance    Time 6    Period Weeks    Status New    Target Date 01/14/21                 Plan - 12/14/20 1634    Clinical Impression Statement Pt limited with tolerance for supine position, core strength not assessed this session.  Focus on spinal and UE mobility as well as stability exercises following recent falls and reports of feeling wobbly.  Pt educated on importance of postureand core activation to assist wtih balance and importance of breathing.  Pt c/o dizzness during wall slides, pt seated, instructed to resume normal breathing and vitals assessed, WNL.  Added standing balance and core strengthening exercises with good tolerance, some intermittent HHA required with NBOS activities.  No reports of increased pain through session.  Added standing balance acitviites to HEP, encouraged to complete near counter or sink.    Personal Factors and Comorbidities Comorbidity 3+;Comorbidity 2;Comorbidity 1    Comorbidities  COPD, Asthma, HTN, A-fib, DM    Stability/Clinical Decision Making Evolving/Moderate complexity    Clinical Decision Making Moderate    Rehab Potential Good    PT Frequency 2x / week    PT Duration 6 weeks    PT Treatment/Interventions ADLs/Self Care Home Management;Aquatic Therapy;Biofeedback;Canalith Repostioning;Cryotherapy;Electrical Stimulation;Iontophoresis 4mg /ml Dexamethasone;Moist Heat;Traction;Balance training;Manual lymph drainage;Manual techniques;Vestibular;Vasopneumatic Device;Taping;Splinting;Orthotic Fit/Training;Energy conservation;Stair training;Functional mobility training;Therapeutic activities;Therapeutic exercise;Gait training;DME Instruction;Patient/family education;Passive range of motion;Dry needling;Joint Manipulations;Spinal Manipulations;Compression bandaging;Visual/perceptual remediation/compensation;Scar mobilization;Neuromuscular re-education;Ultrasound;Parrafin;Fluidtherapy;Contrast Bath    PT Next Visit Plan focus on mobility, assess strength in core/back, focus on lumbar spine but incorporate upper extremity and neck    PT Home Exercise Plan lumbar extension standing; 12/14/20: tandem stance and marching    Consulted and Agree with Plan of Care Patient           Patient will benefit from skilled therapeutic intervention in order to improve the following deficits and impairments:  Abnormal gait,Impaired flexibility,Postural dysfunction,Improper body mechanics,Decreased range of motion,Decreased balance,Decreased mobility,Difficulty walking,Pain,Increased fascial restricitons,Decreased strength,Decreased activity tolerance,Hypomobility  Visit Diagnosis: Pain in left hip  Pain in left leg  Low back pain, unspecified back pain laterality, unspecified chronicity, unspecified whether sciatica present     Problem List Patient Active Problem List   Diagnosis Date Noted  . Other fatigue 12/10/2020  . Abdominal pain, chronic, right lower quadrant 12/08/2020  .  Abnormal weight loss 12/08/2020  . Poor appetite 12/08/2020  . Abnormal EKG   . Chronic diastolic (congestive) heart failure ([place]) 08/29/2019  . Shortness of breath 08/29/2019  . Bipolar disorder ([place]) 08/10/2018  . Headache 08/10/2018  . H/O atrial flutter 08/10/2018  . OSA on CPAP 08/10/2018  . Anxiety 08/10/2018  . Uncontrolled type 2 diabetes mellitus with hyperglycemia ([place]) 03/13/2018  . Hypothyroidism 03/13/2018  . Essential hypertension, benign 03/13/2018  . Mixed hyperlipidemia 03/13/2018  . Hypogammaglobulinemia ([place]) 02/06/2018  . Non-allergic rhinitis 02/06/2018  . Severe persistent asthma, uncomplicated 28/20/6015  . Pulmonary nodules 02/06/2018  . Bronchiectasis without complication ([place]) 61/53/7943  . DM type 2 causing vascular disease ([place]) 02/06/2018   Ihor Austin, LPTA/CLT; CBIS [phone]  Aldona Lento 12/14/2020, 5:15 PM  [facility] [place], [place], [address] Phone: [phone]   Fax:  [phone]  Name: Laura [name] MRN: [id number] Date of Birth: 02/22/1961

## 2020-12-14 NOTE — Patient Instructions (Signed)
Tandem Stance    Standing next to counter/sink for safety. Right foot in front of left, heel touching toe both feet "straight ahead". Stand on Foot Triangle of Support with both feet. Balance in this position 30 seconds. Do with left foot in front of right.  Copyright  VHI. All rights reserved.   FUNCTIONAL MOBILITY: Marching - Standing    Standing next to counter/sink for safety. Tighten abdominal musculature March in place by lifting left leg up, then right. Alternate. 10 reps per set, 2 sets per day, 4 days per week Hold onto a support.  Copyright  VHI. All rights reserved.

## 2020-12-14 NOTE — Telephone Encounter (Signed)
[name] called from [employer] and is needing diagnosis codes for [facility] 2 sensor and reader [phone]

## 2020-12-14 NOTE — Telephone Encounter (Signed)
Tried to call [name] with [employer] back x 2 but telephone number listed was not in service at the time of the calls.

## 2020-12-14 NOTE — Telephone Encounter (Signed)
PATIENT CALLED ABOUT CT SCAN RESULTS AND WANTS TO KNOW THE PLAN FOR HER CARE AND THE NEXT STEP

## 2020-12-15 NOTE — Telephone Encounter (Signed)
Checked imaging notes Laura [name] is waiting to hear from Dr. Abbey Chatters then we will contact the pt.

## 2020-12-15 NOTE — Telephone Encounter (Signed)
Noted  

## 2020-12-16 ENCOUNTER — Ambulatory Visit: Payer: Medicare Other | Admitting: Nutrition

## 2020-12-16 DIAGNOSIS — E113291 Type 2 diabetes mellitus with mild nonproliferative diabetic retinopathy without macular edema, right eye: Secondary | ICD-10-CM | POA: Diagnosis not present

## 2020-12-16 LAB — HM DIABETES EYE EXAM

## 2020-12-16 NOTE — Telephone Encounter (Signed)
[name] called from [employer] requesting a call back about the DX code at [phone] opt 2

## 2020-12-16 NOTE — Telephone Encounter (Signed)
Tried to call Laura [name] at [facility], was left on hold and had to hang up.

## 2020-12-17 ENCOUNTER — Ambulatory Visit (HOSPITAL_COMMUNITY): Payer: Medicare Other

## 2020-12-17 ENCOUNTER — Ambulatory Visit (INDEPENDENT_AMBULATORY_CARE_PROVIDER_SITE_OTHER): Payer: Medicare Other | Admitting: Clinical

## 2020-12-17 ENCOUNTER — Ambulatory Visit: Payer: Medicare Other | Admitting: Podiatry

## 2020-12-17 ENCOUNTER — Other Ambulatory Visit: Payer: Self-pay

## 2020-12-17 ENCOUNTER — Encounter (HOSPITAL_COMMUNITY): Payer: Self-pay

## 2020-12-17 DIAGNOSIS — M25552 Pain in left hip: Secondary | ICD-10-CM | POA: Diagnosis not present

## 2020-12-17 DIAGNOSIS — M545 Low back pain, unspecified: Secondary | ICD-10-CM

## 2020-12-17 DIAGNOSIS — M79605 Pain in left leg: Secondary | ICD-10-CM | POA: Diagnosis not present

## 2020-12-17 DIAGNOSIS — F4310 Post-traumatic stress disorder, unspecified: Secondary | ICD-10-CM | POA: Diagnosis not present

## 2020-12-17 NOTE — Progress Notes (Signed)
Virtual Visit via Video Note  I connected withTamara [name] on 12/17/20 at 2:00 PM EST by a video enabled telemedicine application and verified that I am speaking with the correct person using two identifiers.  Location: Patient: Home Provider: Office  I discussed the limitations of evaluation and management by telemedicine and the availability of in person appointments. The patient expressed understanding and agreed to proceed.    THERAPIST PROGRESS NOTE  Session Time:2:00PM-2:45PM  Participation Level:Active  Behavioral Response:CasualAlertDepressed  Type of Therapy:Individual Therapy  Treatment Goals addressed:Coping  Interventions:CBT, DBT, Solution Focused, Strength-based and Supportive  Summary:Laura Leppertis a 60 y.o.femalewho presents with PTSD.The OPT therapist worked with thepatientfor herongoing OPT treatmentsession. The OPT therapist utilized Motivational Interviewing to assist in creating therapeutic repore. The patient in the session was engaged and work in Science writer about hertriggers and symptoms over the past few weeksincluding a horrible fall she had at a local store from which the patient was taken to the hospital. The patient presents today with significant and drastic bruising of her eye, head, elbows and knees.The patient has since been recovering with no concussion or brain bleeds. The patient has since returned to work.The OPT therapist utilized Cognitive Behavioral Therapy through cognitive restructuring as well as worked with the patient on coping strategies to assist in management ofmental healthsymptoms as well asongoingwork tochallenge current negative automatic thoughts and improve the patients thought processes.The patientspoke of her acknowledgement that things could have been drastically worse.The OPT therapist continued to give support around continuing to follow her health treatment  plans.  Suicidal/Homicidal:Nowithout intent/plan  Therapist Response:The OPT therapist worked with the patient for the patients scheduled session. The patient was engaged in hersession and gave feedback in relation to triggers, symptoms, and behavior responses over the pastfewweeks. The OPT therapist worked with the patient utilizing an in session Cognitive Behavioral Therapy exercise. The patient was responsive in the session and verbalized, "Icould have had a concussion or brain bleed l".The OPT therapistvalidated the patients feelings and conveyed remorse for her injury and pain from the injury related to her recent fall at a local store .The OPT therapist will continue treatment work with the patient in hernext scheduled session  Plan: Return again in2/3weeks.  Diagnosis:Axis I:Post Traumatic Stress Disorder  Axis II:No diagnosis  I discussed the assessment and treatment plan with the patient. The patient was provided an opportunity to ask questions and all were answered. The patient agreed with the plan and demonstrated an understanding of the instructions.  The patient was advised to call back or seek an in-person evaluation if the symptoms worsen or if the condition fails to improve as anticipated.  I provided56minutes of non-face-to-face time during this encounter.  [name], LCSW  12/17/2020

## 2020-12-17 NOTE — Therapy (Signed)
[place], [place], [address] Phone: [phone]   Fax:  [phone]  Physical Therapy Treatment  Patient Details  Name: Laura [name] MRN: [id number] Date of Birth: 03/11/1961 Referring Provider (PT): Matthew Saras [place]   Encounter Date: 12/17/2020   PT End of Session - 12/17/20 0849    Visit Number 4    Number of Visits 12    Date for PT Re-Evaluation 01/14/21    Authorization Type Medicare A, Medcaid 2nd    Progress Note Due on Visit 10    PT Start Time [id number]   late arrival   PT Stop Time 0915    PT Time Calculation (min) 36 min    Activity Tolerance Patient tolerated treatment well;No increased pain    Behavior During Therapy WFL for tasks assessed/performed           Past Medical History:  Diagnosis Date  . Asthma   . Atrial fibrillation ([place])   . Bipolar disorder ([place])   . Bronchiectasis ([place])   . CHF (congestive heart failure) ([place])   . Complication of anesthesia    "my Dr in [place] told me I had an allergic reaction to the combination of the pain meds and the anesthesia" she does not remember what happened but "I eneded up in the ICU for 5 days".  . H/O congenital atrial septal defect (ASD) repair   . Hypogammaglobulinemia ([place])   . Hypothyroid   . 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 Neuropathy   . Osteoarthritis   . PTSD (post-traumatic stress disorder)   . Recurrent sinusitis   . Short-term memory loss   . Sleep apnea   . TIA (transient ischemic attack)   . Tremors of nervous system    seeing PCP for this.  . Type 2 diabetes mellitus ([place])   . Urticaria   . Wound infection     Past Surgical History:  Procedure Laterality Date  . 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 CARDIAC SURGERY    . CARDIOVERSION    . CARDIOVERSION N/A 08/29/2018   Procedure: CARDIOVERSION;  Surgeon: Arnoldo Lenis, MD;  [facility];  Service: Endoscopy;  Laterality: N/A;  . [place]  .  LEFT HEART CATH AND CORONARY ANGIOGRAPHY N/A 08/30/2019   Procedure: LEFT HEART CATH AND CORONARY ANGIOGRAPHY;  Surgeon: [name], MD;  Location: [facility];  Service: Cardiovascular;  Laterality: N/A;  . NASAL SINUS SURGERY  2017    There were no vitals filed for this visit.   Subjective Assessment - 12/17/20 0841    Subjective Pt reports sore achey pain upper and lower back Rt side, pain scale 3/10.  RTW Tuesday with no issues.  Has began balance activities at home, standing by counter for safety.    Pertinent History DB, PTSD, SHF, BBipolar, tremors, depression    Patient Stated Goals to be able to walk a mile    Currently in Pain? Yes    Pain Score 3     Pain Location Back    Pain Orientation Upper;Lower    Pain Descriptors / Indicators Aching;Sore    Pain Type Acute pain    Pain Onset More than a month ago    Pain Frequency Constant    Aggravating Factors  heat, sitting, doing anything for prolonged periods of time, excessive pressure    Pain Relieving Factors tylenol, ice, pressure    Effect of Pain  on Daily Activities limits              [facility] PT Assessment - 12/17/20 0001      Assessment   Medical Diagnosis neck pain wiht radiculopathy and low back pain    Referring Provider (PT) Matthew Saras PA    Next MD Visit neurologist 01/13/21    Prior Therapy yes for current issue      Precautions   Precautions None                         [facility] Adult PT Treatment/Exercise - 12/17/20 0001      Lumbar Exercises: Standing   Functional Squats 10 reps    Functional Squats Limitations 3D hip excursion; front of chair and verbal cueing for mechanics with squats    Other Standing Lumbar Exercises wall slide BUE      Lumbar Exercises: Seated   Other Seated Lumbar Exercises 3D thoracic excursion 10x    Other Seated Lumbar Exercises wback 10x                    PT Short Term Goals - 12/03/20 1052      PT SHORT TERM GOAL #1    Title Patient will be independent with initial HEP and self-management strategies to improve functional outcomes    Time 3    Period Weeks    Status New    Target Date 12/24/20      PT SHORT TERM GOAL #2   Title Patient will be able to demonstrate at least 150 degrees of left active shoulder ROM while sitting to demosntrate improved shoulder mobility.    Time 3    Period Weeks    Status New    Target Date 12/24/20      PT SHORT TERM GOAL #3   Title Patient will report at least 25% improvement in overall symptoms and/or function to demonstrate improved functional mobility             PT Long Term Goals - 12/03/20 1052      PT LONG TERM GOAL #1   Title Patient will improve on FOTO score to meet predicted outcomes to demonstrate improved functional mobility.    Time 6    Period Weeks    Status New    Target Date 01/14/21      PT LONG TERM GOAL #2   Title Patient will report at least 50% overall improvement in subjective complaint to indicate improvement in ability to perform ADLs.    Time 6    Period Weeks    Status New    Target Date 01/14/21      PT LONG TERM GOAL #3   Title Patient will be able to walk a mile to demonstrate improved walking endurance    Time 6    Period Weeks    Status New    Target Date 01/14/21                 Plan - 12/17/20 8101    Clinical Impression Statement Continued session focus with spinal mobility and proximal strengthening.  Added thoracic excursion for upper back mobility as well as scapular strengthening for posture.  Educated importance of posture for pain control as well as assistance wiht balance.  Pt c/o dizziness during wall arch, vitals taken. 139/59 mmHg, pulse 79bpm.  Pt reports she has dizziness and headaches daily following fall.  Has neurologist apt in  April.    Personal Factors and Comorbidities Comorbidity 3+;Comorbidity 2;Comorbidity 1    Comorbidities COPD, Asthma, HTN, A-fib, DM    Stability/Clinical Decision  Making Evolving/Moderate complexity    Clinical Decision Making Moderate    Rehab Potential Good    PT Frequency 2x / week    PT Duration 6 weeks    PT Treatment/Interventions ADLs/Self Care Home Management;Aquatic Therapy;Biofeedback;Canalith Repostioning;Cryotherapy;Electrical Stimulation;Iontophoresis 4mg /ml Dexamethasone;Moist Heat;Traction;Balance training;Manual lymph drainage;Manual techniques;Vestibular;Vasopneumatic Device;Taping;Splinting;Orthotic Fit/Training;Energy conservation;Stair training;Functional mobility training;Therapeutic activities;Therapeutic exercise;Gait training;DME Instruction;Patient/family education;Passive range of motion;Dry needling;Joint Manipulations;Spinal Manipulations;Compression bandaging;Visual/perceptual remediation/compensation;Scar mobilization;Neuromuscular re-education;Ultrasound;Parrafin;Fluidtherapy;Contrast Bath    PT Next Visit Plan focus on mobility, assess strength in core/back, focus on lumbar spine but incorporate upper extremity and neck    PT Home Exercise Plan lumbar extension standing; 12/14/20: tandem stance and marching    Consulted and Agree with Plan of Care Patient           Patient will benefit from skilled therapeutic intervention in order to improve the following deficits and impairments:  Abnormal gait,Impaired flexibility,Postural dysfunction,Improper body mechanics,Decreased range of motion,Decreased balance,Decreased mobility,Difficulty walking,Pain,Increased fascial restricitons,Decreased strength,Decreased activity tolerance,Hypomobility  Visit Diagnosis: Pain in left leg  Pain in left hip  Low back pain, unspecified back pain laterality, unspecified chronicity, unspecified whether sciatica present     Problem List Patient Active Problem List   Diagnosis Date Noted  . Other fatigue 12/10/2020  . Abdominal pain, chronic, right lower quadrant 12/08/2020  . Abnormal weight loss 12/08/2020  . Poor appetite 12/08/2020   . Abnormal EKG   . Chronic diastolic (congestive) heart failure ([place]) 08/29/2019  . Shortness of breath 08/29/2019  . Bipolar disorder ([place]) 08/10/2018  . Headache 08/10/2018  . H/O atrial flutter 08/10/2018  . OSA on CPAP 08/10/2018  . Anxiety 08/10/2018  . Uncontrolled type 2 diabetes mellitus with hyperglycemia ([place]) 03/13/2018  . Hypothyroidism 03/13/2018  . Essential hypertension, benign 03/13/2018  . Mixed hyperlipidemia 03/13/2018  . Hypogammaglobulinemia ([place]) 02/06/2018  . Non-allergic rhinitis 02/06/2018  . Severe persistent asthma, uncomplicated 96/29/5284  . Pulmonary nodules 02/06/2018  . Bronchiectasis without complication ([place]) 13/24/4010  . DM type 2 causing vascular disease ([place]) 02/06/2018   Ihor Austin, LPTA/CLT; CBIS [phone]  Aldona Lento 12/17/2020, 11:15 AM  [place], [place], [address] Phone: [phone]   Fax:  [phone]  Name: Laura [name] MRN: [id number] Date of Birth: 1961/02/09

## 2020-12-17 NOTE — Telephone Encounter (Signed)
Tried to call AD[facility], did not receive an answer.

## 2020-12-18 NOTE — Telephone Encounter (Signed)
[name] called and left a VM needing a return call

## 2020-12-20 ENCOUNTER — Other Ambulatory Visit: Payer: Self-pay | Admitting: Allergy & Immunology

## 2020-12-21 ENCOUNTER — Other Ambulatory Visit: Payer: Self-pay

## 2020-12-21 ENCOUNTER — Ambulatory Visit (HOSPITAL_COMMUNITY): Payer: Medicare Other | Admitting: Physical Therapy

## 2020-12-21 ENCOUNTER — Encounter (HOSPITAL_COMMUNITY): Payer: Self-pay | Admitting: Physical Therapy

## 2020-12-21 DIAGNOSIS — M79605 Pain in left leg: Secondary | ICD-10-CM | POA: Diagnosis not present

## 2020-12-21 DIAGNOSIS — M25552 Pain in left hip: Secondary | ICD-10-CM | POA: Diagnosis not present

## 2020-12-21 DIAGNOSIS — M545 Low back pain, unspecified: Secondary | ICD-10-CM

## 2020-12-21 NOTE — Therapy (Signed)
[place], [place], [address] Phone: [phone]   Fax:  [phone]  Physical Therapy Treatment  Patient Details  Name: Laura [name] MRN: [id number] Date of Birth: 19-Feb-1961 Referring Provider (PT): Matthew Saras [place]   Encounter Date: 12/21/2020   PT End of Session - 12/21/20 1739    Visit Number 5    Number of Visits 12    Date for PT Re-Evaluation 01/14/21    Authorization Type Medicare A, Medcaid 2nd    Progress Note Due on Visit 10    PT Start Time 1500    PT Stop Time 1545    PT Time Calculation (min) 45 min    Activity Tolerance Patient tolerated treatment well    Behavior During Therapy [facility] for tasks assessed/performed           Past Medical History:  Diagnosis Date  . Asthma   . Atrial fibrillation ([place])   . Bipolar disorder ([place])   . Bronchiectasis ([place])   . CHF (congestive heart failure) ([place])   . Complication of anesthesia    "my Dr in [place] told me I had an allergic reaction to the combination of the pain meds and the anesthesia" she does not remember what happened but "I eneded up in the ICU for 5 days".  . H/O congenital atrial septal defect (ASD) repair   . Hypogammaglobulinemia ([place])   . Hypothyroid   . 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 Neuropathy   . Osteoarthritis   . PTSD (post-traumatic stress disorder)   . Recurrent sinusitis   . Short-term memory loss   . Sleep apnea   . TIA (transient ischemic attack)   . Tremors of nervous system    seeing PCP for this.  . Type 2 diabetes mellitus ([place])   . Urticaria   . Wound infection     Past Surgical History:  Procedure Laterality Date  . 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 CARDIAC SURGERY    . CARDIOVERSION    . CARDIOVERSION N/A 08/29/2018   Procedure: CARDIOVERSION;  Surgeon: Arnoldo Lenis, MD;  [facility];  Service: Endoscopy;  Laterality: N/A;  . [place]  . LEFT HEART CATH AND CORONARY  ANGIOGRAPHY N/A 08/30/2019   Procedure: LEFT HEART CATH AND CORONARY ANGIOGRAPHY;  Surgeon: [name], MD;  Location: [facility];  Service: Cardiovascular;  Laterality: N/A;  . NASAL SINUS SURGERY  2017    There were no vitals filed for this visit.   Subjective Assessment - 12/21/20 1737    Subjective Patient says her low back and shoulders are hurting today. Reports compliance with HEP.Notes she had some muscle spasms in low back last night.    Pertinent History DB, PTSD, SHF, BBipolar, tremors, depression    Patient Stated Goals to be able to walk a mile    Currently in Pain? Yes    Pain Score 7     Pain Location Back    Pain Orientation Lower    Pain Descriptors / Indicators Aching;Sharp    Pain Type Chronic pain    Pain Onset More than a month ago    Pain Frequency Constant    Pain Score 6    Pain Location Shoulder    Pain Orientation Right;Left    Pain Descriptors / Indicators Aching    Pain Type Chronic pain    Pain Onset More than a month ago  Pain Frequency Constant                         Adult Aquatic Therapy - 12/21/20 1744      Treatment   Gait Dynamic warmup: pool walking, sidestepping with floats 3 RT each    Exercises DB push pull 3 x 10, DB twist 3 x 10, single arm pull down 2 x 10, ab set 10 x 5", lumbar rotation stretching 3 x 30"                        PT Short Term Goals - 12/03/20 1052      PT SHORT TERM GOAL #1   Title Patient will be independent with initial HEP and self-management strategies to improve functional outcomes    Time 3    Period Weeks    Status New    Target Date 12/24/20      PT SHORT TERM GOAL #2   Title Patient will be able to demonstrate at least 150 degrees of left active shoulder ROM while sitting to demosntrate improved shoulder mobility.    Time 3    Period Weeks    Status New    Target Date 12/24/20      PT SHORT TERM GOAL #3   Title Patient will report at least 25%  improvement in overall symptoms and/or function to demonstrate improved functional mobility             PT Long Term Goals - 12/03/20 1052      PT LONG TERM GOAL #1   Title Patient will improve on FOTO score to meet predicted outcomes to demonstrate improved functional mobility.    Time 6    Period Weeks    Status New    Target Date 01/14/21      PT LONG TERM GOAL #2   Title Patient will report at least 50% overall improvement in subjective complaint to indicate improvement in ability to perform ADLs.    Time 6    Period Weeks    Status New    Target Date 01/14/21      PT LONG TERM GOAL #3   Title Patient will be able to walk a mile to demonstrate improved walking endurance    Time 6    Period Weeks    Status New    Target Date 01/14/21                 Plan - 12/21/20 1740    Clinical Impression Statement Initiated aquatic therapy. Educated patient on benefit and application. Performed core strengthening and trunk/ upper back strength/ mobility exercises. Patient educated on proper form and function of all added activity. Patient noted increased cramping/ pulling on RT side of low back but was relieved with stretching. Patient educated on pacing activity and performing slow gentle stretching to avoid cramps and muscle pain. Patient will continue to benefit from skilled therapy services to progress core strength and mobility to reduce pain and improve functional ability.    Personal Factors and Comorbidities Comorbidity 3+;Comorbidity 2;Comorbidity 1    Comorbidities COPD, Asthma, HTN, A-fib, DM    Stability/Clinical Decision Making Evolving/Moderate complexity    Rehab Potential Good    PT Frequency 2x / week    PT Duration 6 weeks    PT Treatment/Interventions ADLs/Self Care Home Management;Aquatic Therapy;Biofeedback;Canalith Repostioning;Cryotherapy;Electrical Stimulation;Iontophoresis 4mg /ml Dexamethasone;Moist Heat;Traction;Balance training;Manual lymph  drainage;Manual techniques;Vestibular;Vasopneumatic Device;Taping;Splinting;Orthotic Fit/Training;Energy conservation;Stair  training;Functional mobility training;Therapeutic activities;Therapeutic exercise;Gait training;DME Instruction;Patient/family education;Passive range of motion;Dry needling;Joint Manipulations;Spinal Manipulations;Compression bandaging;Visual/perceptual remediation/compensation;Scar mobilization;Neuromuscular re-education;Ultrasound;Parrafin;Fluidtherapy;Contrast Bath    PT Next Visit Plan focus on mobility, assess strength in core/back, focus on lumbar spine but incorporate upper extremity and neck    PT Home Exercise Plan lumbar extension standing; 12/14/20: tandem stance and marching    Consulted and Agree with Plan of Care Patient           Patient will benefit from skilled therapeutic intervention in order to improve the following deficits and impairments:  Abnormal gait,Impaired flexibility,Postural dysfunction,Improper body mechanics,Decreased range of motion,Decreased balance,Decreased mobility,Difficulty walking,Pain,Increased fascial restricitons,Decreased strength,Decreased activity tolerance,Hypomobility  Visit Diagnosis: Pain in left leg  Pain in left hip  Low back pain, unspecified back pain laterality, unspecified chronicity, unspecified whether sciatica present     Problem List Patient Active Problem List   Diagnosis Date Noted  . Other fatigue 12/10/2020  . Abdominal pain, chronic, right lower quadrant 12/08/2020  . Abnormal weight loss 12/08/2020  . Poor appetite 12/08/2020  . Abnormal EKG   . Chronic diastolic (congestive) heart failure ([place]) 08/29/2019  . Shortness of breath 08/29/2019  . Bipolar disorder ([place]) 08/10/2018  . Headache 08/10/2018  . H/O atrial flutter 08/10/2018  . OSA on CPAP 08/10/2018  . Anxiety 08/10/2018  . Uncontrolled type 2 diabetes mellitus with hyperglycemia ([place]) 03/13/2018  . Hypothyroidism 03/13/2018  .  Essential hypertension, benign 03/13/2018  . Mixed hyperlipidemia 03/13/2018  . Hypogammaglobulinemia ([place]) 02/06/2018  . Non-allergic rhinitis 02/06/2018  . Severe persistent asthma, uncomplicated 10/12/7251  . Pulmonary nodules 02/06/2018  . Bronchiectasis without complication ([place]) 66/44/0347  . DM type 2 causing vascular disease ([place]) 02/06/2018   5:48 PM, 12/21/20 Josue Hector PT DPT  Physical Therapist with [facility]  [phone] [place] [address] [place], [place], [address] Phone: [phone]   Fax:  [phone]  Name: Laura [name] MRN: [id number] Date of Birth: June 15, 1961

## 2020-12-21 NOTE — Telephone Encounter (Signed)
Returned call and gave DX E11.65 to pharmacy

## 2020-12-22 ENCOUNTER — Encounter: Payer: Medicare Other | Attending: "Endocrinology | Admitting: Nutrition

## 2020-12-22 VITALS — Ht 64.0 in | Wt 206.6 lb

## 2020-12-22 DIAGNOSIS — E669 Obesity, unspecified: Secondary | ICD-10-CM | POA: Diagnosis not present

## 2020-12-22 DIAGNOSIS — I10 Essential (primary) hypertension: Secondary | ICD-10-CM | POA: Insufficient documentation

## 2020-12-22 DIAGNOSIS — E1165 Type 2 diabetes mellitus with hyperglycemia: Secondary | ICD-10-CM | POA: Diagnosis not present

## 2020-12-22 DIAGNOSIS — E782 Mixed hyperlipidemia: Secondary | ICD-10-CM

## 2020-12-22 NOTE — Progress Notes (Signed)
Medical Nutrition Therapy  Appointment Start time:  0800 Appointment End time:  0915  Primary concerns today: DIabetes Type 2   Referral diagnosis: E11.8 Preferred learning style:  no preference indicated   Learning readiness:  change in progress  NUTRITION ASSESSMENT   Anthropometrics   FBS: 128 mg/dl.  Range 124-128 mg/dl..  Clinical Medical Hx: CHF and DM,  Medications: Lantus 45, 5 units of humalog with meals with SS and Trulicity weekly.  Labs:  Lab Results  Component Value Date   HGBA1C 7.7 11/02/2020   CMP Latest Ref Rng & Units 12/07/2020 11/02/2020 08/13/2020  Glucose 70 - 99 mg/dL 70 - 149(H)  BUN 6 - 20 mg/dL 13 19 15   Creatinine 0.44 - 1.00 mg/dL 0.64 1.1 0.76  Sodium 135 - 145 mmol/L 139 140 137  Potassium 3.5 - 5.1 mmol/L 3.6 4.2 4.5  Chloride 98 - 111 mmol/L 108 104 98  CO2 22 - 32 mmol/L 21(L) - 30  Calcium 8.9 - 10.3 mg/dL 9.0 8.4(A) 9.4  Total Protein 6.5 - 8.1 g/dL - - 7.2  Total Bilirubin 0.3 - 1.2 mg/dL - - 0.5  Alkaline Phos 25 - 125 - 69 60  AST 13 - 35 - 15 16  ALT 7 - 35 - 8 14   Notable Signs/Symptoms:    Lifestyle & Dietary  Lives with her sister. Works at [employer].   Estimated daily fluid intake:  56 Supplements:  Sleep: varies  Stress / self-care: stress eater Current average weekly physical activity: ADL   24-Hr Dietary Recall First Meal: 6" sub italian few bites Snack:  Second Meal: 6" sub, water or zero vitamin water Snack:  Third Meal: skips sometimes Snack:  Beverages: water, diet soda, flavored sf water.56+  Estimated Energy Needs Calories: 1200 Carbohydrate: 135g Protein: 90g Fat: 33g   NUTRITION DIAGNOSIS  NB-1.1 Food and nutrition-related knowledge deficit As related to Diabetes Type 2.  As evidenced by A1C 7.7%.   NUTRITION INTERVENTION  Nutrition education (E-1) on the following topics:  . Nutrition and Diabetes education provided on My Plate, CHO counting, meal planning, portion sizes, timing of  meals, avoiding snacks between meals unless having a low blood sugar, target ranges for A1C and blood sugars, signs/symptoms and treatment of hyper/hypoglycemia, monitoring blood sugars, taking medications as prescribed, benefits of exercising 30 minutes per day and prevention of complications of DM. Marland Kitchen   Handouts Provided Include   My Plate   Meal PLan Card  Diabetes Instructions.  Learning Style & Readiness for Change Teaching method utilized: Visual & Auditory  Demonstrated degree of understanding via: Teach Back  Barriers to learning/adherence to lifestyle change: none  Goals Established by Pt . Follow My Plate . Eat 30-45 g CHO at meals . Do not skip meals . Measure foods out . Increase low carb vegetables to 1 cup with lunch and dinner daily. . Drink only water and cut out diet sodas. Get A1C to 7% or less.  MONITORING & EVALUATION Dietary intake, weekly physical activity, and blood sugars in 1 month.  Next Steps  Patient is to start meals planning better.

## 2020-12-23 ENCOUNTER — Telehealth: Payer: Self-pay | Admitting: Internal Medicine

## 2020-12-23 ENCOUNTER — Ambulatory Visit: Payer: Medicare Other | Admitting: Internal Medicine

## 2020-12-23 NOTE — Telephone Encounter (Signed)
See other phone note

## 2020-12-23 NOTE — Telephone Encounter (Signed)
Good Morning, What's next for this pt, PLEASE ADVISE!!!!.

## 2020-12-23 NOTE — Telephone Encounter (Signed)
PATIENT CALLED ASKING IF ANYTHING HAS BEEN DECIDED ON HER CARE

## 2020-12-23 NOTE — Telephone Encounter (Signed)
From a GI standpoint, her CT imaging is relatively unremarkable besides diverticulosis.  If she is still having significant symptoms including nausea and abdominal pain, we can proceed with EGD and colonoscopy to further evaluate.  Thank you

## 2020-12-23 NOTE — Telephone Encounter (Signed)
Spoke with the pt and she is still having GI issues (nausea, & abd pain). So she is ok with having EGD & Colonoscopy scheduled. Also she asked me "what you were going to do about the cyst on her ovary". I advised the pt she needs to get with her GYN to take care of that. She states she didn't have one so she will have her PCP to do a referral to a GYN.    2. Rga Clinical please proceed with EGD and Colonoscopy

## 2020-12-23 NOTE — Telephone Encounter (Signed)
Called pt, no answer and line rang numerous times.  

## 2020-12-23 NOTE — Telephone Encounter (Signed)
ASA III due to afib and CHF listed on med history

## 2020-12-23 NOTE — Telephone Encounter (Signed)
Noted  

## 2020-12-23 NOTE — Telephone Encounter (Signed)
Please advise ASA type

## 2020-12-24 ENCOUNTER — Other Ambulatory Visit: Payer: Self-pay

## 2020-12-24 ENCOUNTER — Ambulatory Visit (HOSPITAL_COMMUNITY): Payer: Medicare Other | Admitting: Physical Therapy

## 2020-12-24 ENCOUNTER — Encounter: Payer: Self-pay | Admitting: *Deleted

## 2020-12-24 ENCOUNTER — Telehealth: Payer: Self-pay

## 2020-12-24 ENCOUNTER — Encounter (HOSPITAL_COMMUNITY): Payer: Self-pay | Admitting: Physical Therapy

## 2020-12-24 DIAGNOSIS — M25552 Pain in left hip: Secondary | ICD-10-CM

## 2020-12-24 DIAGNOSIS — M545 Low back pain, unspecified: Secondary | ICD-10-CM | POA: Diagnosis not present

## 2020-12-24 DIAGNOSIS — M79605 Pain in left leg: Secondary | ICD-10-CM

## 2020-12-24 MED ORDER — NA SULFATE-K SULFATE-MG SULF 17.5-3.13-1.6 GM/177ML PO SOLN: 1.0000 | Freq: Once | ORAL | 0 refills | Status: AC

## 2020-12-24 NOTE — Patient Instructions (Signed)
Access Code: [email or web address] URL: [email or web address] Date: 12/24/2020 Prepared by: Josue Hector  Exercises Seated Scapular Retraction - 2-3 x daily - 7 x weekly - 2 sets - 10 reps - 3 seconds hold Seated Cervical Rotation AROM - 2-3 x daily - 7 x weekly - 1 sets - 10 reps Seated Cervical Extension AROM - 2-3 x daily - 7 x weekly - 1 sets - 10 reps Seated Upper Trapezius Stretch - 2-3 x daily - 7 x weekly - 1 sets - 3 reps - 20 seconds hold

## 2020-12-24 NOTE — Telephone Encounter (Signed)
Called pt. She has been scheduled for procedures 4/19 am appt. Aware will mail prep instructions with pre-op/covid test appt. Advised will send prep rx to pharmacy. Confirmed pharmacy.

## 2020-12-24 NOTE — Telephone Encounter (Signed)
Laura Menghini, PA-C  [name], CMA  Hold Eliquis 48 hours before procedures. She also needs to take 1/2 Lantus dosage the day of prep.

## 2020-12-24 NOTE — Addendum Note (Signed)
Addended by: Cheron Every on: 12/24/2020 11:01 AM   Modules accepted: Orders

## 2020-12-24 NOTE — Telephone Encounter (Signed)
Hi Leslie, This pt returned calls from yesterday, and while trying to find out who called her she wants to know what you are going to do her urinating all the time. States while at work there are a lot of times she barely makes it to the bathroom. States she mentioned it to you while she was here at her office visit. Pt states it is a very big issue. Please advise

## 2020-12-24 NOTE — Therapy (Signed)
[place], [place], [address] Phone: [phone]   Fax:  [phone]  Physical Therapy Treatment  Patient Details  Name: Laura [name] MRN: [id number] Date of Birth: October 29, 1960 Referring Provider (PT): Matthew Saras [place]   Encounter Date: 12/24/2020   PT End of Session - 12/24/20 1128    Visit Number 6    Number of Visits 12    Date for PT Re-Evaluation 01/14/21    Authorization Type Medicare A, Medcaid 2nd    Progress Note Due on Visit 10    PT Start Time 1123    PT Stop Time 1206    PT Time Calculation (min) 43 min    Activity Tolerance Patient tolerated treatment well    Behavior During Therapy [facility] for tasks assessed/performed           Past Medical History:  Diagnosis Date  . Asthma   . Atrial fibrillation ([place])   . Bipolar disorder ([place])   . Bronchiectasis ([place])   . CHF (congestive heart failure) ([place])   . Complication of anesthesia    "my Dr in [place] told me I had an allergic reaction to the combination of the pain meds and the anesthesia" she does not remember what happened but "I eneded up in the ICU for 5 days".  . H/O congenital atrial septal defect (ASD) repair   . Hypogammaglobulinemia ([place])   . Hypothyroid   . 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 Neuropathy   . Osteoarthritis   . PTSD (post-traumatic stress disorder)   . Recurrent sinusitis   . Short-term memory loss   . Sleep apnea   . TIA (transient ischemic attack)   . Tremors of nervous system    seeing PCP for this.  . Type 2 diabetes mellitus ([place])   . Urticaria   . Wound infection     Past Surgical History:  Procedure Laterality Date  . 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 CARDIAC SURGERY    . CARDIOVERSION    . CARDIOVERSION N/A 08/29/2018   Procedure: CARDIOVERSION;  Surgeon: Arnoldo Lenis, MD;  [facility];  Service: Endoscopy;  Laterality: N/A;  . [place]  . LEFT HEART CATH AND CORONARY  ANGIOGRAPHY N/A 08/30/2019   Procedure: LEFT HEART CATH AND CORONARY ANGIOGRAPHY;  Surgeon: [name], MD;  Location: [place] CV LAB;  Service: Cardiovascular;  Laterality: N/A;  . NASAL SINUS SURGERY  2017    There were no vitals filed for this visit.   Subjective Assessment - 12/24/20 1127    Subjective Patient says she is doing well today. She says she bought a lumbar roll and it has been helping.    Pertinent History DB, PTSD, SHF, BBipolar, tremors, depression    Patient Stated Goals to be able to walk a mile    Currently in Pain? Yes    Pain Score 4     Pain Location Back    Pain Orientation Lower    Pain Descriptors / Indicators Aching;Dull    Pain Type Chronic pain    Pain Onset More than a month ago    Pain Frequency Constant    Pain Score 4    Pain Location Neck    Pain Orientation Posterior    Pain Descriptors / Indicators Aching;Dull    Pain Type Chronic pain    Pain Onset More than a month ago    Pain Frequency Constant  [place] Adult PT Treatment/Exercise - 12/24/20 0001      Exercises   Exercises Neck      Neck Exercises: Seated   Other Seated Exercise cervical AROM 3D excursions x 10 each      Lumbar Exercises: Standing   Heel Raises 20 reps    Other Standing Lumbar Exercises standing hip abduction/ extension 2 x 10 each      Lumbar Exercises: Seated   Other Seated Lumbar Exercises scapular retraction 20 x 3", W back 10 x 3"    Other Seated Lumbar Exercises Attempted thoracic excursion, too difficult holding arms up, tandem stance 2 x 30" each      Neck Exercises: Stretches   Upper Trapezius Stretch Right;Left;3 reps;30 seconds                    PT Short Term Goals - 12/03/20 1052      PT SHORT TERM GOAL #1   Title Patient will be independent with initial HEP and self-management strategies to improve functional outcomes    Time 3    Period Weeks    Status New    Target Date  12/24/20      PT SHORT TERM GOAL #2   Title Patient will be able to demonstrate at least 150 degrees of left active shoulder ROM while sitting to demosntrate improved shoulder mobility.    Time 3    Period Weeks    Status New    Target Date 12/24/20      PT SHORT TERM GOAL #3   Title Patient will report at least 25% improvement in overall symptoms and/or function to demonstrate improved functional mobility             PT Long Term Goals - 12/03/20 1052      PT LONG TERM GOAL #1   Title Patient will improve on FOTO score to meet predicted outcomes to demonstrate improved functional mobility.    Time 6    Period Weeks    Status New    Target Date 01/14/21      PT LONG TERM GOAL #2   Title Patient will report at least 50% overall improvement in subjective complaint to indicate improvement in ability to perform ADLs.    Time 6    Period Weeks    Status New    Target Date 01/14/21      PT LONG TERM GOAL #3   Title Patient will be able to walk a mile to demonstrate improved walking endurance    Time 6    Period Weeks    Status New    Target Date 01/14/21                 Plan - 12/24/20 1214    Clinical Impression Statement Patient tolerated session well overall today. Patient notes some positional pain sitting, improved with lumbar towel roll. Progressed reps with postural strengthening. Held thoracic excursions due to difficulty holding arms up against gravity. Added upper trap stretching to address neck pain and restriction. Patient notes improved mobility and decreased pain post treatment. Issued updated HEP handout. Patient will continue to benefit from skilled therapy services to progress postural and core strength to reduce pain and improve functional ability.    Personal Factors and Comorbidities Comorbidity 3+;Comorbidity 2;Comorbidity 1    Comorbidities COPD, Asthma, HTN, A-fib, DM    Stability/Clinical Decision Making Evolving/Moderate complexity    Rehab  Potential Good    PT Frequency  2x / week    PT Duration 6 weeks    PT Treatment/Interventions ADLs/Self Care Home Management;Aquatic Therapy;Biofeedback;Canalith Repostioning;Cryotherapy;Electrical Stimulation;Iontophoresis 4mg /ml Dexamethasone;Moist Heat;Traction;Balance training;Manual lymph drainage;Manual techniques;Vestibular;Vasopneumatic Device;Taping;Splinting;Orthotic Fit/Training;Energy conservation;Stair training;Functional mobility training;Therapeutic activities;Therapeutic exercise;Gait training;DME Instruction;Patient/family education;Passive range of motion;Dry needling;Joint Manipulations;Spinal Manipulations;Compression bandaging;Visual/perceptual remediation/compensation;Scar mobilization;Neuromuscular re-education;Ultrasound;Parrafin;Fluidtherapy;Contrast Bath    PT Next Visit Plan Cotinue core and postural strength progressions as tolerated. Add tandem on foam, step ups, band rows when ready.    PT Home Exercise Plan lumbar extension standing; 12/14/20: tandem stance and marching 3/17 scapular retraction, UT stretching, cervical AROM    Consulted and Agree with Plan of Care Patient           Patient will benefit from skilled therapeutic intervention in order to improve the following deficits and impairments:  Abnormal gait,Impaired flexibility,Postural dysfunction,Improper body mechanics,Decreased range of motion,Decreased balance,Decreased mobility,Difficulty walking,Pain,Increased fascial restricitons,Decreased strength,Decreased activity tolerance,Hypomobility  Visit Diagnosis: Pain in left leg  Pain in left hip  Low back pain, unspecified back pain laterality, unspecified chronicity, unspecified whether sciatica present     Problem List Patient Active Problem List   Diagnosis Date Noted  . Other fatigue 12/10/2020  . Abdominal pain, chronic, right lower quadrant 12/08/2020  . Abnormal weight loss 12/08/2020  . Poor appetite 12/08/2020  . Abnormal EKG   . Chronic  diastolic (congestive) heart failure ([place]) 08/29/2019  . Shortness of breath 08/29/2019  . Bipolar disorder ([place]) 08/10/2018  . Headache 08/10/2018  . H/O atrial flutter 08/10/2018  . OSA on CPAP 08/10/2018  . Anxiety 08/10/2018  . Uncontrolled type 2 diabetes mellitus with hyperglycemia ([place]) 03/13/2018  . Hypothyroidism 03/13/2018  . Essential hypertension, benign 03/13/2018  . Mixed hyperlipidemia 03/13/2018  . Hypogammaglobulinemia ([place]) 02/06/2018  . Non-allergic rhinitis 02/06/2018  . Severe persistent asthma, uncomplicated 37/85/8850  . Pulmonary nodules 02/06/2018  . Bronchiectasis without complication ([place]) 27/74/1287  . DM type 2 causing vascular disease ([place]) 02/06/2018   12:17 PM, 12/24/20 Josue Hector PT DPT  Physical Therapist with [facility]  [phone] [place] [address] [place], [place], [address] Phone: [phone]   Fax:  [phone]  Name: Laura [name] MRN: [id number] Date of Birth: 12-12-1960

## 2020-12-28 ENCOUNTER — Ambulatory Visit (HOSPITAL_COMMUNITY): Payer: Medicare Other | Admitting: Physical Therapy

## 2020-12-28 ENCOUNTER — Other Ambulatory Visit: Payer: Self-pay

## 2020-12-28 DIAGNOSIS — M79605 Pain in left leg: Secondary | ICD-10-CM | POA: Diagnosis not present

## 2020-12-28 DIAGNOSIS — M25552 Pain in left hip: Secondary | ICD-10-CM | POA: Diagnosis not present

## 2020-12-28 DIAGNOSIS — M545 Low back pain, unspecified: Secondary | ICD-10-CM

## 2020-12-29 ENCOUNTER — Other Ambulatory Visit: Payer: Self-pay | Admitting: *Deleted

## 2020-12-29 ENCOUNTER — Encounter (HOSPITAL_COMMUNITY): Payer: Self-pay | Admitting: Physical Therapy

## 2020-12-29 DIAGNOSIS — N949 Unspecified condition associated with female genital organs and menstrual cycle: Secondary | ICD-10-CM

## 2020-12-29 NOTE — Therapy (Signed)
[place], [place], [address] Phone: [phone]   Fax:  [phone]  Physical Therapy Treatment  Patient Details  Name: Laura [name] MRN: [id number] Date of Birth: 07/26/1961 Referring Provider (PT): Matthew Saras [place]   Encounter Date: 12/28/2020   PT End of Session - 12/28/20 1106    Visit Number 7    Number of Visits 12    Date for PT Re-Evaluation 01/14/21    Authorization Type Medicare A, Medcaid 2nd    Progress Note Due on Visit 10    PT Start Time 1504    PT Stop Time 1551    PT Time Calculation (min) 47 min    Activity Tolerance Patient tolerated treatment well    Behavior During Therapy [facility] for tasks assessed/performed           Past Medical History:  Diagnosis Date  . Asthma   . Atrial fibrillation ([place])   . Bipolar disorder ([place])   . Bronchiectasis ([place])   . CHF (congestive heart failure) ([place])   . Complication of anesthesia    "my Dr in [place] told me I had an allergic reaction to the combination of the pain meds and the anesthesia" she does not remember what happened but "I eneded up in the ICU for 5 days".  . H/O congenital atrial septal defect (ASD) repair   . Hypogammaglobulinemia ([place])   . Hypothyroid   . 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 Neuropathy   . Osteoarthritis   . PTSD (post-traumatic stress disorder)   . Recurrent sinusitis   . Short-term memory loss   . Sleep apnea   . TIA (transient ischemic attack)   . Tremors of nervous system    seeing PCP for this.  . Type 2 diabetes mellitus ([place])   . Urticaria   . Wound infection     Past Surgical History:  Procedure Laterality Date  . 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 CARDIAC SURGERY    . CARDIOVERSION    . CARDIOVERSION N/A 08/29/2018   Procedure: CARDIOVERSION;  Surgeon: Arnoldo Lenis, MD;  [facility];  Service: Endoscopy;  Laterality: N/A;  . [place]  . LEFT HEART CATH AND CORONARY  ANGIOGRAPHY N/A 08/30/2019   Procedure: LEFT HEART CATH AND CORONARY ANGIOGRAPHY;  Surgeon: [name], MD;  Location: [place] CV LAB;  Service: Cardiovascular;  Laterality: N/A;  . NASAL SINUS SURGERY  2017    There were no vitals filed for this visit.   Subjective Assessment - 12/28/20 1103    Subjective Patient states she is doing better today. She likes the neck exercises and feels they are helping.    Pertinent History DB, PTSD, SHF, BBipolar, tremors, depression    Patient Stated Goals to be able to walk a mile    Currently in Pain? Yes    Pain Score 4     Pain Location Back    Pain Orientation Posterior;Lower    Pain Descriptors / Indicators Aching    Pain Type Chronic pain    Pain Onset More than a month ago    Pain Frequency Constant    Pain Onset More than a month ago                         Adult Aquatic Therapy - 12/29/20 1110      Treatment   Gait Dynamic warmup: pool  walking, sidestepping, retro walking with floats 3 RT each    Exercises DB push pull 3 x 10, alternating DB row 3 x10, DB cross over 3 x 10, DB twist 3 x 10, single arm pull down 2 x 10, double arm DB push down 2 x 10                        PT Short Term Goals - 12/03/20 1052      PT SHORT TERM GOAL #1   Title Patient will be independent with initial HEP and self-management strategies to improve functional outcomes    Time 3    Period Weeks    Status New    Target Date 12/24/20      PT SHORT TERM GOAL #2   Title Patient will be able to demonstrate at least 150 degrees of left active shoulder ROM while sitting to demosntrate improved shoulder mobility.    Time 3    Period Weeks    Status New    Target Date 12/24/20      PT SHORT TERM GOAL #3   Title Patient will report at least 25% improvement in overall symptoms and/or function to demonstrate improved functional mobility             PT Long Term Goals - 12/03/20 1052      PT LONG TERM GOAL  #1   Title Patient will improve on FOTO score to meet predicted outcomes to demonstrate improved functional mobility.    Time 6    Period Weeks    Status New    Target Date 01/14/21      PT LONG TERM GOAL #2   Title Patient will report at least 50% overall improvement in subjective complaint to indicate improvement in ability to perform ADLs.    Time 6    Period Weeks    Status New    Target Date 01/14/21      PT LONG TERM GOAL #3   Title Patient will be able to walk a mile to demonstrate improved walking endurance    Time 6    Period Weeks    Status New    Target Date 01/14/21                 Plan - 12/29/20 1106    Clinical Impression Statement Patient shows improved activity tolerance today and tolerated session well. Progressed core strengthening and mobility exercise. Patient educated on proper form and function of all added activity. Patient was well challenged with added dumbbell twist for core strength and spine mobility. Patient had trouble coordinating the movement and required several verbal cues as well as demo. Patient denies increased pain during treatment. Patient will continue to benefit from skilled therapy services to reduce deficits and improve functional ability.    Personal Factors and Comorbidities Comorbidity 3+;Comorbidity 2;Comorbidity 1    Comorbidities COPD, Asthma, HTN, A-fib, DM    Stability/Clinical Decision Making Evolving/Moderate complexity    Rehab Potential Good    PT Frequency 2x / week    PT Duration 6 weeks    PT Treatment/Interventions ADLs/Self Care Home Management;Aquatic Therapy;Biofeedback;Canalith Repostioning;Cryotherapy;Electrical Stimulation;Iontophoresis 4mg /ml Dexamethasone;Moist Heat;Traction;Balance training;Manual lymph drainage;Manual techniques;Vestibular;Vasopneumatic Device;Taping;Splinting;Orthotic Fit/Training;Energy conservation;Stair training;Functional mobility training;Therapeutic activities;Therapeutic exercise;Gait  training;DME Instruction;Patient/family education;Passive range of motion;Dry needling;Joint Manipulations;Spinal Manipulations;Compression bandaging;Visual/perceptual remediation/compensation;Scar mobilization;Neuromuscular re-education;Ultrasound;Parrafin;Fluidtherapy;Contrast Bath    PT Next Visit Plan [place] core and postural strength progressions as tolerated. Add tandem on foam, step ups, band  rows when ready.    PT Home Exercise Plan lumbar extension standing; 12/14/20: tandem stance and marching 3/17 scapular retraction, UT stretching, cervical AROM    Consulted and Agree with Plan of Care Patient           Patient will benefit from skilled therapeutic intervention in order to improve the following deficits and impairments:  Abnormal gait,Impaired flexibility,Postural dysfunction,Improper body mechanics,Decreased range of motion,Decreased balance,Decreased mobility,Difficulty walking,Pain,Increased fascial restricitons,Decreased strength,Decreased activity tolerance,Hypomobility  Visit Diagnosis: Pain in left leg  Pain in left hip  Low back pain, unspecified back pain laterality, unspecified chronicity, unspecified whether sciatica present     Problem List Patient Active Problem List   Diagnosis Date Noted  . Other fatigue 12/10/2020  . Abdominal pain, chronic, right lower quadrant 12/08/2020  . Abnormal weight loss 12/08/2020  . Poor appetite 12/08/2020  . Abnormal EKG   . Chronic diastolic (congestive) heart failure ([place]) 08/29/2019  . Shortness of breath 08/29/2019  . Bipolar disorder ([place]) 08/10/2018  . Headache 08/10/2018  . H/O atrial flutter 08/10/2018  . OSA on CPAP 08/10/2018  . Anxiety 08/10/2018  . Uncontrolled type 2 diabetes mellitus with hyperglycemia ([place]) 03/13/2018  . Hypothyroidism 03/13/2018  . Essential hypertension, benign 03/13/2018  . Mixed hyperlipidemia 03/13/2018  . Hypogammaglobulinemia ([place]) 02/06/2018  . Non-allergic rhinitis 02/06/2018  .  Severe persistent asthma, uncomplicated 25/63/8937  . Pulmonary nodules 02/06/2018  . Bronchiectasis without complication ([place]) 34/28/7681  . DM type 2 causing vascular disease ([place]) 02/06/2018   11:13 AM, 12/29/20 Josue Hector PT DPT  Physical Therapist with [facility]  [phone] [place] [address] [place], [place], [address] Phone: [phone]   Fax:  [phone]  Name: Laura [name] MRN: [id number] Date of Birth: 30-Oct-1960

## 2020-12-29 NOTE — Telephone Encounter (Signed)
See CT result note. Tried to call pt, LMOAM.

## 2020-12-29 NOTE — Telephone Encounter (Signed)
See result note.  

## 2020-12-29 NOTE — Telephone Encounter (Signed)
Noted.   Once I hear from the pt I will forward to the [facility] to do the referral

## 2020-12-31 ENCOUNTER — Encounter (HOSPITAL_COMMUNITY): Payer: Self-pay | Admitting: Physical Therapy

## 2020-12-31 ENCOUNTER — Other Ambulatory Visit: Payer: Self-pay

## 2020-12-31 ENCOUNTER — Ambulatory Visit (HOSPITAL_COMMUNITY): Payer: Medicare Other | Admitting: Physical Therapy

## 2020-12-31 DIAGNOSIS — M25552 Pain in left hip: Secondary | ICD-10-CM | POA: Diagnosis not present

## 2020-12-31 DIAGNOSIS — M79605 Pain in left leg: Secondary | ICD-10-CM

## 2020-12-31 DIAGNOSIS — M545 Low back pain, unspecified: Secondary | ICD-10-CM | POA: Diagnosis not present

## 2020-12-31 NOTE — Patient Instructions (Signed)
Access Code: [email or web address] URL: [email or web address] Date: 12/31/2020 Prepared by: Josue Hector  Exercises Standing Shoulder Posterior Capsule Stretch - 3 x daily - 7 x weekly - 1 sets - 3 reps - 30 seconds hold

## 2020-12-31 NOTE — Therapy (Signed)
[place], [place], [address] Phone: [phone]   Fax:  [phone]  Physical Therapy Treatment  Patient Details  Name: Laura [name] MRN: [id number] Date of Birth: September 09, 1961 Referring Provider (PT): Matthew Saras [place]   Encounter Date: 12/31/2020   PT End of Session - 12/31/20 1120    Visit Number 8    Number of Visits 12    Date for PT Re-Evaluation 01/14/21    Authorization Type Medicare A, Medcaid 2nd    Progress Note Due on Visit 10    PT Start Time 1117    PT Stop Time 1200    PT Time Calculation (min) 43 min    Activity Tolerance Patient tolerated treatment well    Behavior During Therapy [facility] for tasks assessed/performed           Past Medical History:  Diagnosis Date  . Asthma   . Atrial fibrillation ([place])   . Bipolar disorder ([place])   . Bronchiectasis ([place])   . CHF (congestive heart failure) ([place])   . Complication of anesthesia    "my Dr in [place] told me I had an allergic reaction to the combination of the pain meds and the anesthesia" she does not remember what happened but "I eneded up in the ICU for 5 days".  . H/O congenital atrial septal defect (ASD) repair   . Hypogammaglobulinemia ([place])   . Hypothyroid   . 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 Neuropathy   . Osteoarthritis   . PTSD (post-traumatic stress disorder)   . Recurrent sinusitis   . Short-term memory loss   . Sleep apnea   . TIA (transient ischemic attack)   . Tremors of nervous system    seeing PCP for this.  . Type 2 diabetes mellitus ([place])   . Urticaria   . Wound infection     Past Surgical History:  Procedure Laterality Date  . 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 CARDIAC SURGERY    . CARDIOVERSION    . CARDIOVERSION N/A 08/29/2018   Procedure: CARDIOVERSION;  Surgeon: Arnoldo Lenis, MD;  [facility];  Service: Endoscopy;  Laterality: N/A;  . [place]  . LEFT HEART CATH AND CORONARY  ANGIOGRAPHY N/A 08/30/2019   Procedure: LEFT HEART CATH AND CORONARY ANGIOGRAPHY;  Surgeon: [name], MD;  Location: [place] CV LAB;  Service: Cardiovascular;  Laterality: N/A;  . NASAL SINUS SURGERY  2017    There were no vitals filed for this visit.   Subjective Assessment - 12/31/20 1120    Subjective Patient says she is doing better. She says she feels better when she does exercises and stretches.    Pertinent History DB, PTSD, SHF, BBipolar, tremors, depression    Patient Stated Goals to be able to walk a mile    Currently in Pain? Yes    Pain Score 3     Pain Location Neck    Pain Orientation Lower;Posterior    Pain Descriptors / Indicators Aching;Dull    Pain Type Chronic pain    Pain Onset More than a month ago    Pain Frequency Constant    Pain Onset More than a month ago                             [facility] Adult PT Treatment/Exercise - 12/31/20 0001      Neck Exercises:  Seated   Cervical Isometrics Right lateral flexion;Left lateral flexion;5 secs;5 reps    Neck Retraction 10 reps    Other Seated Exercise cervical AROM 3D excursions x 10 each      Lumbar Exercises: Seated   Other Seated Lumbar Exercises scapular retraction 10 x5"      Manual Therapy   Manual Therapy Soft tissue mobilization    Manual therapy comments all manual interventions performed independently of other intervention    Soft tissue mobilization IASTM to bilateral upper trap, levator, cervical paraspinals      Neck Exercises: Stretches   Upper Trapezius Stretch Right;Left;30 seconds;2 reps    Chest Stretch 3 reps;30 seconds   in doorway, low hand position   Other Neck Stretches shoulder cross body adduction stretch LT 3 x 30"                    PT Short Term Goals - 12/03/20 1052      PT SHORT TERM GOAL #1   Title Patient will be independent with initial HEP and self-management strategies to improve functional outcomes    Time 3    Period Weeks     Status New    Target Date 12/24/20      PT SHORT TERM GOAL #2   Title Patient will be able to demonstrate at least 150 degrees of left active shoulder ROM while sitting to demosntrate improved shoulder mobility.    Time 3    Period Weeks    Status New    Target Date 12/24/20      PT SHORT TERM GOAL #3   Title Patient will report at least 25% improvement in overall symptoms and/or function to demonstrate improved functional mobility             PT Long Term Goals - 12/03/20 1052      PT LONG TERM GOAL #1   Title Patient will improve on FOTO score to meet predicted outcomes to demonstrate improved functional mobility.    Time 6    Period Weeks    Status New    Target Date 01/14/21      PT LONG TERM GOAL #2   Title Patient will report at least 50% overall improvement in subjective complaint to indicate improvement in ability to perform ADLs.    Time 6    Period Weeks    Status New    Target Date 01/14/21      PT LONG TERM GOAL #3   Title Patient will be able to walk a mile to demonstrate improved walking endurance    Time 6    Period Weeks    Status New    Target Date 01/14/21                 Plan - 12/31/20 1720    Clinical Impression Statement Patient with continued restriction in cervical musculature. Added shoulder adduction stretch for shoulder capsule mobility. Patient noting improved ability to reach behind her back following. Performed IASTM to cervical musculature with patient seated to address pain and restriction. Patient notes improved mobility and decreased pain following. Issued updated HEP handout for shoulder stretching. Patient will continue to benefit from skilled therapy services to reduce limitations and improve functional ability.    Personal Factors and Comorbidities Comorbidity 3+;Comorbidity 2;Comorbidity 1    Comorbidities COPD, Asthma, HTN, A-fib, DM    Stability/Clinical Decision Making Evolving/Moderate complexity    Rehab Potential  Good    PT  Frequency 2x / week    PT Duration 6 weeks    PT Treatment/Interventions ADLs/Self Care Home Management;Aquatic Therapy;Biofeedback;Canalith Repostioning;Cryotherapy;Electrical Stimulation;Iontophoresis 4mg /ml Dexamethasone;Moist Heat;Traction;Balance training;Manual lymph drainage;Manual techniques;Vestibular;Vasopneumatic Device;Taping;Splinting;Orthotic Fit/Training;Energy conservation;Stair training;Functional mobility training;Therapeutic activities;Therapeutic exercise;Gait training;DME Instruction;Patient/family education;Passive range of motion;Dry needling;Joint Manipulations;Spinal Manipulations;Compression bandaging;Visual/perceptual remediation/compensation;Scar mobilization;Neuromuscular re-education;Ultrasound;Parrafin;Fluidtherapy;Contrast Bath    PT Next Visit Plan Cotin[place] core and postural strength progressions as tolerated. Add tandem on foam, step ups, band rows when ready.    PT Home Exercise Plan lumbar extension standing; 12/14/20: tandem stance and marching 3/17 scapular retraction, UT stretching, cervical AROM 3/24 shoulder adduction stretch    Consulted and Agree with Plan of Care Patient           Patient will benefit from skilled therapeutic intervention in order to improve the following deficits and impairments:  Abnormal gait,Impaired flexibility,Postural dysfunction,Improper body mechanics,Decreased range of motion,Decreased balance,Decreased mobility,Difficulty walking,Pain,Increased fascial restricitons,Decreased strength,Decreased activity tolerance,Hypomobility  Visit Diagnosis: Pain in left leg  Pain in left hip  Low back pain, unspecified back pain laterality, unspecified chronicity, unspecified whether sciatica present     Problem List Patient Active Problem List   Diagnosis Date Noted  . Other fatigue 12/10/2020  . Abdominal pain, chronic, right lower quadrant 12/08/2020  . Abnormal weight loss 12/08/2020  . Poor appetite 12/08/2020  .  Abnormal EKG   . Chronic diastolic (congestive) heart failure ([place]) 08/29/2019  . Shortness of breath 08/29/2019  . Bipolar disorder ([place]) 08/10/2018  . Headache 08/10/2018  . H/O atrial flutter 08/10/2018  . OSA on CPAP 08/10/2018  . Anxiety 08/10/2018  . Uncontrolled type 2 diabetes mellitus with hyperglycemia ([place]) 03/13/2018  . Hypothyroidism 03/13/2018  . Essential hypertension, benign 03/13/2018  . Mixed hyperlipidemia 03/13/2018  . Hypogammaglobulinemia ([place]) 02/06/2018  . Non-allergic rhinitis 02/06/2018  . Severe persistent asthma, uncomplicated 79/12/8331  . Pulmonary nodules 02/06/2018  . Bronchiectasis without complication ([place]) 83/29/1916  . DM type 2 causing vascular disease ([place]) 02/06/2018    5:29 PM, 12/31/20 Josue Hector PT DPT  Physical Therapist with [facility]  [phone] [place] [address] [place], [place], [address] Phone: [phone]   Fax:  [phone]  Name: Laura [name] MRN: [id number] Date of Birth: 07/01/1961

## 2021-01-04 ENCOUNTER — Other Ambulatory Visit: Payer: Self-pay

## 2021-01-04 ENCOUNTER — Ambulatory Visit (HOSPITAL_COMMUNITY): Payer: Medicare Other | Admitting: Physical Therapy

## 2021-01-04 DIAGNOSIS — M79605 Pain in left leg: Secondary | ICD-10-CM

## 2021-01-04 DIAGNOSIS — M25552 Pain in left hip: Secondary | ICD-10-CM | POA: Diagnosis not present

## 2021-01-04 DIAGNOSIS — M545 Low back pain, unspecified: Secondary | ICD-10-CM

## 2021-01-05 ENCOUNTER — Encounter (HOSPITAL_COMMUNITY): Payer: Self-pay | Admitting: Physical Therapy

## 2021-01-05 DIAGNOSIS — Z6836 Body mass index (BMI) 36.0-36.9, adult: Secondary | ICD-10-CM | POA: Diagnosis not present

## 2021-01-05 DIAGNOSIS — I10 Essential (primary) hypertension: Secondary | ICD-10-CM | POA: Diagnosis not present

## 2021-01-05 DIAGNOSIS — M4802 Spinal stenosis, cervical region: Secondary | ICD-10-CM | POA: Diagnosis not present

## 2021-01-05 NOTE — Therapy (Signed)
[place], [place], [address] Phone: [phone]   Fax:  [phone]  Physical Therapy Treatment  Patient Details  Name: Laura [name] MRN: [id number] Date of Birth: [birth year] Referring Provider (PT): Matthew Saras [place]   Encounter Date: 01/04/2021   PT End of Session - 01/05/21 0843    Visit Number 9    Number of Visits 12    Date for PT Re-Evaluation 01/14/21    Authorization Type Medicare A, Medcaid 2nd    Progress Note Due on Visit 10    PT Start Time 1510    PT Stop Time 1600    PT Time Calculation (min) 50 min    Activity Tolerance Patient tolerated treatment well    Behavior During Therapy [facility] for tasks assessed/performed           Past Medical History:  Diagnosis Date  . Asthma   . Atrial fibrillation ([place])   . Bipolar disorder ([place])   . Bronchiectasis ([place])   . CHF (congestive heart failure) ([place])   . Complication of anesthesia    "my Dr in [place] told me I had an allergic reaction to the combination of the pain meds and the anesthesia" she does not remember what happened but "I eneded up in the ICU for 5 days".  . H/O congenital atrial septal defect (ASD) repair   . Hypogammaglobulinemia ([place])   . Hypothyroid   . 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 Neuropathy   . Osteoarthritis   . PTSD (post-traumatic stress disorder)   . Recurrent sinusitis   . Short-term memory loss   . Sleep apnea   . TIA (transient ischemic attack)   . Tremors of nervous system    seeing PCP for this.  . Type 2 diabetes mellitus ([place])   . Urticaria   . Wound infection     Past Surgical History:  Procedure Laterality Date  . 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 CARDIAC SURGERY    . CARDIOVERSION    . CARDIOVERSION N/A 08/29/2018   Procedure: CARDIOVERSION;  Surgeon: Arnoldo Lenis, MD;  [facility];  Service: Endoscopy;  Laterality: N/A;  . [place]  . LEFT HEART CATH AND CORONARY  ANGIOGRAPHY N/A 08/30/2019   Procedure: LEFT HEART CATH AND CORONARY ANGIOGRAPHY;  Surgeon: [name], MD;  Location: [facility] CV LAB;  Service: Cardiovascular;  Laterality: N/A;  . NASAL SINUS SURGERY  2017    There were no vitals filed for this visit.   Subjective Assessment - 01/05/21 0842    Subjective Patient states she feels better. She has been doing HEP. She says manual IASTM was helpful last visit. Felt good the rest of that day.    Pertinent History DB, PTSD, SHF, BBipolar, tremors, depression    Patient Stated Goals to be able to walk a mile    Currently in Pain? Yes    Pain Score 3     Pain Location Neck    Pain Orientation Lower;Posterior    Pain Descriptors / Indicators Aching;Dull    Pain Type Chronic pain    Pain Onset More than a month ago    Pain Frequency Constant    Pain Onset More than a month ago                         Adult Aquatic Therapy - 01/05/21 4196  Treatment   Gait Dynamic warmup: pool walking, sidestepping, retro walking with floats 3 RT each    Exercises DB push pull 3 x 10, alternating DB row 3 x10, DB cross over 3 x 10, DB butterflies x30, DB adduction 2 x10, single arm pull down 2 x 10                        PT Short Term Goals - 12/03/20 1052      PT SHORT TERM GOAL #1   Title Patient will be independent with initial HEP and self-management strategies to improve functional outcomes    Time 3    Period Weeks    Status New    Target Date 12/24/20      PT SHORT TERM GOAL #2   Title Patient will be able to demonstrate at least 150 degrees of left active shoulder ROM while sitting to demosntrate improved shoulder mobility.    Time 3    Period Weeks    Status New    Target Date 12/24/20      PT SHORT TERM GOAL #3   Title Patient will report at least 25% improvement in overall symptoms and/or function to demonstrate improved functional mobility             PT Long Term Goals - 12/03/20  1052      PT LONG TERM GOAL #1   Title Patient will improve on FOTO score to meet predicted outcomes to demonstrate improved functional mobility.    Time 6    Period Weeks    Status New    Target Date 01/14/21      PT LONG TERM GOAL #2   Title Patient will report at least 50% overall improvement in subjective complaint to indicate improvement in ability to perform ADLs.    Time 6    Period Weeks    Status New    Target Date 01/14/21      PT LONG TERM GOAL #3   Title Patient will be able to walk a mile to demonstrate improved walking endurance    Time 6    Period Weeks    Status New    Target Date 01/14/21                 Plan - 01/05/21 0843    Clinical Impression Statement Patient tolerated session well today with no increased complaint of pain. Able to progress shoulder and postural strength for improved cervical function and decreasing neck pain. Patient educated on proper form and function of all added exercise. Patient cued on hand placement and core activation with dumbbell pull down series. Patient will continue to benefit from skilled therapy services to reduce restrictions and improve functional ability.    Personal Factors and Comorbidities Comorbidity 3+;Comorbidity 2;Comorbidity 1    Comorbidities COPD, Asthma, HTN, A-fib, DM    Stability/Clinical Decision Making Evolving/Moderate complexity    Rehab Potential Good    PT Frequency 2x / week    PT Duration 6 weeks    PT Treatment/Interventions ADLs/Self Care Home Management;Aquatic Therapy;Biofeedback;Canalith Repostioning;Cryotherapy;Electrical Stimulation;Iontophoresis 4mg /ml Dexamethasone;Moist Heat;Traction;Balance training;Manual lymph drainage;Manual techniques;Vestibular;Vasopneumatic Device;Taping;Splinting;Orthotic Fit/Training;Energy conservation;Stair training;Functional mobility training;Therapeutic activities;Therapeutic exercise;Gait training;DME Instruction;Patient/family education;Passive range of  motion;Dry needling;Joint Manipulations;Spinal Manipulations;Compression bandaging;Visual/perceptual remediation/compensation;Scar mobilization;Neuromuscular re-education;Ultrasound;Parrafin;Fluidtherapy;Contrast Bath    PT Next Visit Plan [place]ue core and postural strength progressions as tolerated. Add tandem on foam, step ups, band rows when ready.    PT Home Exercise Plan lumbar  extension standing; 12/14/20: tandem stance and marching 3/17 scapular retraction, UT stretching, cervical AROM 3/24 shoulder adduction stretch    Consulted and Agree with Plan of Care Patient           Patient will benefit from skilled therapeutic intervention in order to improve the following deficits and impairments:  Abnormal gait,Impaired flexibility,Postural dysfunction,Improper body mechanics,Decreased range of motion,Decreased balance,Decreased mobility,Difficulty walking,Pain,Increased fascial restricitons,Decreased strength,Decreased activity tolerance,Hypomobility  Visit Diagnosis: Pain in left leg  Pain in left hip  Low back pain, unspecified back pain laterality, unspecified chronicity, unspecified whether sciatica present     Problem List Patient Active Problem List   Diagnosis Date Noted  . Other fatigue 12/10/2020  . Abdominal pain, chronic, right lower quadrant 12/08/2020  . Abnormal weight loss 12/08/2020  . Poor appetite 12/08/2020  . Abnormal EKG   . Chronic diastolic (congestive) heart failure ([place]) 08/29/2019  . Shortness of breath 08/29/2019  . Bipolar disorder ([place]) 08/10/2018  . Headache 08/10/2018  . H/O atrial flutter 08/10/2018  . OSA on CPAP 08/10/2018  . Anxiety 08/10/2018  . Uncontrolled type 2 diabetes mellitus with hyperglycemia ([place]) 03/13/2018  . Hypothyroidism 03/13/2018  . Essential hypertension, benign 03/13/2018  . Mixed hyperlipidemia 03/13/2018  . Hypogammaglobulinemia ([place]) 02/06/2018  . Non-allergic rhinitis 02/06/2018  . Severe persistent asthma,  uncomplicated 42/68/3419  . Pulmonary nodules 02/06/2018  . Bronchiectasis without complication ([place]) 62/22/9798  . DM type 2 causing vascular disease ([place]) 02/06/2018   8:53 AM, 01/05/21 Josue Hector PT DPT  Physical Therapist with [facility]  [phone] [place] [address] [place], [place], [address] Phone: [phone]   Fax:  [phone]  Name: Nevin Kozuch MRN: [id number] Date of Birth: 1961-03-20

## 2021-01-07 ENCOUNTER — Ambulatory Visit (HOSPITAL_COMMUNITY): Payer: Medicare Other | Admitting: Physical Therapy

## 2021-01-07 ENCOUNTER — Encounter (HOSPITAL_COMMUNITY): Payer: Self-pay | Admitting: Physical Therapy

## 2021-01-07 ENCOUNTER — Other Ambulatory Visit: Payer: Self-pay

## 2021-01-07 DIAGNOSIS — M25552 Pain in left hip: Secondary | ICD-10-CM | POA: Diagnosis not present

## 2021-01-07 DIAGNOSIS — M545 Low back pain, unspecified: Secondary | ICD-10-CM

## 2021-01-07 DIAGNOSIS — M79605 Pain in left leg: Secondary | ICD-10-CM

## 2021-01-07 NOTE — Therapy (Signed)
[facility] [address] [place], [place], [address] Phone: [phone]   Fax:  [phone]  Physical Therapy Treatment  Patient Details  Name: Laura [name] MRN: [id number] Date of Birth: 1961/03/12 Referring Provider (PT): Matthew Saras PA  Progress Note Reporting Period 12/03/20 to 01/07/21  See note below for Objective Data and Assessment of Progress/Goals.      Encounter Date: 01/07/2021   PT End of Session - 01/07/21 1126    Visit Number 10    Number of Visits 18    Date for PT Re-Evaluation 02/05/21    Authorization Type Medicare A, Medcaid 2nd    Progress Note Due on Visit 18    PT Start Time 1119    PT Stop Time 1200    PT Time Calculation (min) 41 min    Activity Tolerance Patient tolerated treatment well    Behavior During Therapy WFL for tasks assessed/performed           Past Medical History:  Diagnosis Date  . Asthma   . Atrial fibrillation ([place])   . Bipolar disorder ([place])   . Bronchiectasis ([place])   . CHF (congestive heart failure) ([place])   . Complication of anesthesia    "my Dr in [place] told me I had an allergic reaction to the combination of the pain meds and the anesthesia" she does not remember what happened but "I eneded up in the ICU for 5 days".  . H/O congenital atrial septal defect (ASD) repair   . Hypogammaglobulinemia ([place])   . Hypothyroid   . 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 Neuropathy   . Osteoarthritis   . PTSD (post-traumatic stress disorder)   . Recurrent sinusitis   . Short-term memory loss   . Sleep apnea   . TIA (transient ischemic attack)   . Tremors of nervous system    seeing PCP for this.  . Type 2 diabetes mellitus ([place])   . Urticaria   . Wound infection     Past Surgical History:  Procedure Laterality Date  . 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 CARDIAC SURGERY    . CARDIOVERSION    . CARDIOVERSION N/A 08/29/2018   Procedure: CARDIOVERSION;  Surgeon: Arnoldo Lenis, MD;  [facility];  Service: Endoscopy;  Laterality: N/A;  . [place]  . LEFT HEART CATH AND CORONARY ANGIOGRAPHY N/A 08/30/2019   Procedure: LEFT HEART CATH AND CORONARY ANGIOGRAPHY;  Surgeon: [name], MD;  Location: [facility];  Service: Cardiovascular;  Laterality: N/A;  . NASAL SINUS SURGERY  2017    There were no vitals filed for this visit.   Subjective Assessment - 01/07/21 1125    Subjective Patient says things are coming along well. She states she feels she is about 50% improved since starting therapy. Notes definite improvements. Still limited by stiffness and pain, but is able to move better and sit longer.    Pertinent History DB, PTSD, SHF, BBipolar, tremors, depression    Limitations Sitting;Standing;Walking;House hold activities;Lifting    Diagnostic tests xrays    Patient Stated Goals to be able to walk a mile    Currently in Pain? Yes    Pain Score 3     Pain Location Neck    Pain Orientation Lower;Posterior    Pain Descriptors / Indicators Aching;Dull    Pain Type Chronic pain    Pain Onset More than a month ago  Pain Frequency Constant    Aggravating Factors  sitting, doing anything for prolonged periods of time, excessive pressure    Pain Relieving Factors tylenol, ice, pressure    Effect of Pain on Daily Activities limits    Pain Onset More than a month ago              [facility] PT Assessment - 01/07/21 0001      Assessment   Medical Diagnosis neck pain wiht radiculopathy and low back pain    Referring Provider (PT) Matthew Saras PA      Balance Screen   Has the patient fallen in the past 6 months No      [place] residence    Living Arrangements Other relatives      Prior Function   Level of Independence Independent      Cognition   Overall Cognitive Status Within Functional Limits for tasks assessed      Observation/Other Assessments   Focus on Therapeutic  Outcomes (FOTO)  54% function   was 49% (lumbar)     AROM   Left Shoulder Flexion 128 Degrees   was 130 (RT flexion 135)                        [facility] Adult PT Treatment/Exercise - 01/07/21 0001      Neck Exercises: Seated   Cervical Isometrics Right lateral flexion;Left lateral flexion;5 secs;5 reps      Lumbar Exercises: Standing   Row Both;20 reps;Theraband    Theraband Level (Row) Level 2 (Red)      Manual Therapy   Manual Therapy Soft tissue mobilization    Manual therapy comments all manual interventions performed independently of other intervention    Soft tissue mobilization IASTM to bilateral upper trap, levator, cervical paraspinals      Neck Exercises: Stretches   Upper Trapezius Stretch Right;Left;30 seconds;2 reps    Other Neck Stretches shoulder cross body adduction stretch LT 2 x 30"                    PT Short Term Goals - 01/07/21 1204      PT SHORT TERM GOAL #1   Title Patient will be independent with initial HEP and self-management strategies to improve functional outcomes    Baseline Reports compliance, demos good return    Time 3    Period Weeks    Status Achieved    Target Date 12/24/20      PT SHORT TERM GOAL #2   Title Patient will be able to demonstrate at least 150 degrees of left active shoulder ROM while sitting to demosntrate improved shoulder mobility.    Baseline Current 128    Time 3    Period Weeks    Status On-going    Target Date 12/24/20      PT SHORT TERM GOAL #3   Title Patient will report at least 25% improvement in overall symptoms and/or function to demonstrate improved functional mobility    Baseline Reports 50%    Status Achieved             PT Long Term Goals - 01/07/21 1205      PT LONG TERM GOAL #1   Title Patient will improve on FOTO score to meet predicted outcomes to demonstrate improved functional mobility.    Time 6    Period Weeks    Status Achieved  PT LONG TERM GOAL #2    Title Patient will report at least 50% overall improvement in subjective complaint to indicate improvement in ability to perform ADLs.    Baseline Reports 50%    Time 6    Period Weeks    Status Achieved      PT LONG TERM GOAL #3   Title Patient will be able to walk a mile to demonstrate improved walking endurance    Time 6    Period Weeks    Status On-going                 Plan - 01/07/21 1205    Clinical Impression Statement Patient showing good progress toward therapy goals. Patient with improving posture, decreased pain and overall activity tolerance. Patient remains limited by AROM restriction, postural weakness and muscle restriction which continue to negatively impact functional ability and contribute to pain. Patient will continue to benefit from skilled therapy services to address remaining deficits and improve LOF with ADLs.    Personal Factors and Comorbidities Comorbidity 3+;Comorbidity 2;Comorbidity 1    Comorbidities COPD, Asthma, HTN, A-fib, DM    Stability/Clinical Decision Making Evolving/Moderate complexity    Rehab Potential Good    PT Frequency 2x / week    PT Duration 4 weeks    PT Treatment/Interventions ADLs/Self Care Home Management;Aquatic Therapy;Biofeedback;Canalith Repostioning;Cryotherapy;Electrical Stimulation;Iontophoresis 4mg /ml Dexamethasone;Moist Heat;Traction;Balance training;Manual lymph drainage;Manual techniques;Vestibular;Vasopneumatic Device;Taping;Splinting;Orthotic Fit/Training;Energy conservation;Stair training;Functional mobility training;Therapeutic activities;Therapeutic exercise;Gait training;DME Instruction;Patient/family education;Passive range of motion;Dry needling;Joint Manipulations;Spinal Manipulations;Compression bandaging;Visual/perceptual remediation/compensation;Scar mobilization;Neuromuscular re-education;Ultrasound;Parrafin;Fluidtherapy;Contrast Bath    PT Next Visit Plan Cotinue core and postural strength progressions as  tolerated. Add tandem on foam, step ups, band rows when ready.    PT Home Exercise Plan lumbar extension standing; 12/14/20: tandem stance and marching 3/17 scapular retraction, UT stretching, cervical AROM 3/24 shoulder adduction stretch    Consulted and Agree with Plan of Care Patient           Patient will benefit from skilled therapeutic intervention in order to improve the following deficits and impairments:  Abnormal gait,Impaired flexibility,Postural dysfunction,Improper body mechanics,Decreased range of motion,Decreased balance,Decreased mobility,Difficulty walking,Pain,Increased fascial restricitons,Decreased strength,Decreased activity tolerance,Hypomobility  Visit Diagnosis: Pain in left leg  Pain in left hip  Low back pain, unspecified back pain laterality, unspecified chronicity, unspecified whether sciatica present     Problem List Patient Active Problem List   Diagnosis Date Noted  . Other fatigue 12/10/2020  . Abdominal pain, chronic, right lower quadrant 12/08/2020  . Abnormal weight loss 12/08/2020  . Poor appetite 12/08/2020  . Abnormal EKG   . Chronic diastolic (congestive) heart failure ([place]) 08/29/2019  . Shortness of breath 08/29/2019  . Bipolar disorder ([place]) 08/10/2018  . Headache 08/10/2018  . H/O atrial flutter 08/10/2018  . OSA on CPAP 08/10/2018  . Anxiety 08/10/2018  . Uncontrolled type 2 diabetes mellitus with hyperglycemia ([place]) 03/13/2018  . Hypothyroidism 03/13/2018  . Essential hypertension, benign 03/13/2018  . Mixed hyperlipidemia 03/13/2018  . Hypogammaglobulinemia ([place]) 02/06/2018  . Non-allergic rhinitis 02/06/2018  . Severe persistent asthma, uncomplicated 54/62/7035  . Pulmonary nodules 02/06/2018  . Bronchiectasis without complication ([place]) 00/93/8182  . DM type 2 causing vascular disease ([place]) 02/06/2018   12:10 PM, 01/07/21 Josue Hector PT DPT  Physical Therapist with [facility]  [phone]  [facility] [address] [place], [place], [address] Phone: [phone]   Fax:  [phone]  Name: Laura [name] MRN: [id number]  Date of Birth: May 01, 1961

## 2021-01-11 ENCOUNTER — Encounter: Payer: Self-pay | Admitting: Nutrition

## 2021-01-11 ENCOUNTER — Ambulatory Visit (HOSPITAL_COMMUNITY): Payer: Medicare Other | Attending: Orthopedic Surgery | Admitting: Physical Therapy

## 2021-01-11 ENCOUNTER — Other Ambulatory Visit: Payer: Self-pay

## 2021-01-11 DIAGNOSIS — M25552 Pain in left hip: Secondary | ICD-10-CM

## 2021-01-11 DIAGNOSIS — M542 Cervicalgia: Secondary | ICD-10-CM | POA: Diagnosis not present

## 2021-01-11 DIAGNOSIS — M79605 Pain in left leg: Secondary | ICD-10-CM | POA: Diagnosis not present

## 2021-01-11 DIAGNOSIS — M545 Low back pain, unspecified: Secondary | ICD-10-CM | POA: Diagnosis not present

## 2021-01-11 NOTE — Patient Instructions (Addendum)
  Goals Established by Pt . Follow My Plate . Eat 30-45 g CHO at meals . Do not skip meals . Measure foods out . Increase low carb vegetables to 1 cup with lunch and dinner daily. . Drink only water and cut out diet sodas. Get A1C to 7% or less.

## 2021-01-12 ENCOUNTER — Telehealth: Payer: Self-pay

## 2021-01-12 ENCOUNTER — Encounter (HOSPITAL_COMMUNITY): Payer: Self-pay | Admitting: Physical Therapy

## 2021-01-12 NOTE — Therapy (Signed)
[facility] [place], [place], [address] Phone: [phone]   Fax:  [phone]  Physical Therapy Treatment  Patient Details  Name: Laura [name] MRN: [id number] Date of Birth: November 07, 1960 Referring Provider (PT): Matthew Saras [place]   Encounter Date: 01/11/2021   PT End of Session - 01/12/21 1628    Visit Number 11    Number of Visits 18    Date for PT Re-Evaluation 02/05/21    Authorization Type Medicare A, Medcaid 2nd    Progress Note Due on Visit 18    PT Start Time 1510    PT Stop Time 1600    PT Time Calculation (min) 50 min    Activity Tolerance Patient tolerated treatment well    Behavior During Therapy [facility] for tasks assessed/performed           Past Medical History:  Diagnosis Date  . Asthma   . Atrial fibrillation ([place])   . Bipolar disorder ([place])   . Bronchiectasis ([place])   . CHF (congestive heart failure) ([place])   . Complication of anesthesia    "my Dr in [place] told me I had an allergic reaction to the combination of the pain meds and the anesthesia" she does not remember what happened but "I eneded up in the ICU for 5 days".  . H/O congenital atrial septal defect (ASD) repair   . Hypogammaglobulinemia ([place])   . Hypothyroid   . 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 Neuropathy   . Osteoarthritis   . PTSD (post-traumatic stress disorder)   . Recurrent sinusitis   . Short-term memory loss   . Sleep apnea   . TIA (transient ischemic attack)   . Tremors of nervous system    seeing PCP for this.  . Type 2 diabetes mellitus ([place])   . Urticaria   . Wound infection     Past Surgical History:  Procedure Laterality Date  . 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 CARDIAC SURGERY    . CARDIOVERSION    . CARDIOVERSION N/A 08/29/2018   Procedure: CARDIOVERSION;  Surgeon: Arnoldo Lenis, MD;  [facility];  Service: Endoscopy;  Laterality: N/A;  . [place]  . LEFT HEART CATH AND CORONARY  ANGIOGRAPHY N/A 08/30/2019   Procedure: LEFT HEART CATH AND CORONARY ANGIOGRAPHY;  Surgeon: [name], MD;  Location: [facility] LAB;  Service: Cardiovascular;  Laterality: N/A;  . NASAL SINUS SURGERY  2017    There were no vitals filed for this visit.   Subjective Assessment - 01/12/21 1627    Subjective Patient says she is doing well today. Current neck pain is a 3/10    Pertinent History DB, PTSD, SHF, BBipolar, tremors, depression    Limitations Sitting;Standing;Walking;House hold activities;Lifting    Diagnostic tests xrays    Patient Stated Goals to be able to walk a mile    Currently in Pain? Yes    Pain Score 3     Pain Location Neck    Pain Orientation Posterior;Lower    Pain Descriptors / Indicators Aching;Dull    Pain Type Chronic pain    Pain Onset More than a month ago    Pain Frequency Constant    Pain Onset More than a month ago                         Adult Aquatic Therapy - 01/12/21 1634  Treatment   Exercises shoulder adduction stretch 3 x 20", cervical AROM x10 each (rotation/ flexion/ extension), lat stretch 3 x 20", DB push pull 3 x 10, alternating DB row 3 x10, DB cross over 3 x 10, DB butterflies x30, DB adduction 2 x10, single arm pull down 2 x 10, double arm pull down x 10, dumbbell chops x20                        PT Short Term Goals - 01/07/21 1204      PT SHORT TERM GOAL #1   Title Patient will be independent with initial HEP and self-management strategies to improve functional outcomes    Baseline Reports compliance, demos good return    Time 3    Period Weeks    Status Achieved    Target Date 12/24/20      PT SHORT TERM GOAL #2   Title Patient will be able to demonstrate at least 150 degrees of left active shoulder ROM while sitting to demosntrate improved shoulder mobility.    Baseline Current 128    Time 3    Period Weeks    Status On-going    Target Date 12/24/20      PT SHORT TERM GOAL  #3   Title Patient will report at least 25% improvement in overall symptoms and/or function to demonstrate improved functional mobility    Baseline Reports 50%    Status Achieved             PT Long Term Goals - 01/07/21 1205      PT LONG TERM GOAL #1   Title Patient will improve on FOTO score to meet predicted outcomes to demonstrate improved functional mobility.    Time 6    Period Weeks    Status Achieved      PT LONG TERM GOAL #2   Title Patient will report at least 50% overall improvement in subjective complaint to indicate improvement in ability to perform ADLs.    Baseline Reports 50%    Time 6    Period Weeks    Status Achieved      PT LONG TERM GOAL #3   Title Patient will be able to walk a mile to demonstrate improved walking endurance    Time 6    Period Weeks    Status On-going                 Plan - 01/12/21 1629    Clinical Impression Statement Patient tolerated session well today with no increased complaint of pain. She shows good return for prior ther ex, but does require verbal cues to avoid shoulder hike with dumbbell pulldown. Patient does continue to have increased difficulty with coordination of dumbbell chops, but is able to improve with verbal cues and demo. Added lat stretch to address complaint of neck and shoulder restriction. Patient will continue to benefit from skilled therapy services to reduce limitations and improve functional ability.    Personal Factors and Comorbidities Comorbidity 3+;Comorbidity 2;Comorbidity 1    Comorbidities COPD, Asthma, HTN, A-fib, DM    Stability/Clinical Decision Making Evolving/Moderate complexity    Rehab Potential Good    PT Frequency 2x / week    PT Duration 4 weeks    PT Treatment/Interventions ADLs/Self Care Home Management;Aquatic Therapy;Biofeedback;Canalith Repostioning;Cryotherapy;Electrical Stimulation;Iontophoresis 4mg /ml Dexamethasone;Moist Heat;Traction;Balance training;Manual lymph  drainage;Manual techniques;Vestibular;Vasopneumatic Device;Taping;Splinting;Orthotic Fit/Training;Energy conservation;Stair training;Functional mobility training;Therapeutic activities;Therapeutic exercise;Gait training;DME Instruction;Patient/family education;Passive range of motion;Dry needling;Joint Manipulations;Spinal  Manipulations;Compression bandaging;Visual/perceptual remediation/compensation;Scar mobilization;Neuromuscular re-education;Ultrasound;Parrafin;Fluidtherapy;Contrast Bath    PT Next Visit Plan Cotinue core and postural strength progressions as tolerated. Add tandem on foam, step ups, band rows when ready.    PT Home Exercise Plan lumbar extension standing; 12/14/20: tandem stance and marching 3/17 scapular retraction, UT stretching, cervical AROM 3/24 shoulder adduction stretch    Consulted and Agree with Plan of Care Patient           Patient will benefit from skilled therapeutic intervention in order to improve the following deficits and impairments:  Abnormal gait,Impaired flexibility,Postural dysfunction,Improper body mechanics,Decreased range of motion,Decreased balance,Decreased mobility,Difficulty walking,Pain,Increased fascial restricitons,Decreased strength,Decreased activity tolerance,Hypomobility  Visit Diagnosis: Pain in left leg  Pain in left hip  Low back pain, unspecified back pain laterality, unspecified chronicity, unspecified whether sciatica present     Problem List Patient Active Problem List   Diagnosis Date Noted  . Other fatigue 12/10/2020  . Abdominal pain, chronic, right lower quadrant 12/08/2020  . Abnormal weight loss 12/08/2020  . Poor appetite 12/08/2020  . Abnormal EKG   . Chronic diastolic (congestive) heart failure ([place]) 08/29/2019  . Shortness of breath 08/29/2019  . Bipolar disorder ([place]) 08/10/2018  . Headache 08/10/2018  . H/O atrial flutter 08/10/2018  . OSA on CPAP 08/10/2018  . Anxiety 08/10/2018  . Uncontrolled type 2  diabetes mellitus with hyperglycemia ([place]) 03/13/2018  . Hypothyroidism 03/13/2018  . Essential hypertension, benign 03/13/2018  . Mixed hyperlipidemia 03/13/2018  . Hypogammaglobulinemia ([place]) 02/06/2018  . Non-allergic rhinitis 02/06/2018  . Severe persistent asthma, uncomplicated 63/89/3734  . Pulmonary nodules 02/06/2018  . Bronchiectasis without complication ([place]) 28/76/8115  . DM type 2 causing vascular disease ([place]) 02/06/2018   4:36 PM, 01/12/21 Josue Hector PT DPT  Physical Therapist with [facility]  [phone] [facility] [address] [place], [place], [address] Phone: [phone]   Fax:  [phone]  Name: Laura [name] MRN: [id number] Date of Birth: November 02, 1960

## 2021-01-12 NOTE — Telephone Encounter (Signed)
Laura [name] with [facility] left a voicemail that they have been trying to reach this pt multiple times regarding her Libre 2. They are asking for a call back at [phone]

## 2021-01-12 NOTE — Telephone Encounter (Signed)
Called patient, she has spoken with them, she is going to call them again today., patient is unsure if she wants to proceed with the [place] or not due to cost.

## 2021-01-13 ENCOUNTER — Ambulatory Visit (INDEPENDENT_AMBULATORY_CARE_PROVIDER_SITE_OTHER): Payer: Medicare Other | Admitting: Neurology

## 2021-01-13 ENCOUNTER — Other Ambulatory Visit: Payer: Self-pay

## 2021-01-13 DIAGNOSIS — G5622 Lesion of ulnar nerve, left upper limb: Secondary | ICD-10-CM

## 2021-01-13 DIAGNOSIS — R251 Tremor, unspecified: Secondary | ICD-10-CM | POA: Diagnosis not present

## 2021-01-13 DIAGNOSIS — R202 Paresthesia of skin: Secondary | ICD-10-CM

## 2021-01-13 DIAGNOSIS — G5601 Carpal tunnel syndrome, right upper limb: Secondary | ICD-10-CM

## 2021-01-13 DIAGNOSIS — M5412 Radiculopathy, cervical region: Secondary | ICD-10-CM

## 2021-01-13 NOTE — Procedures (Signed)
[facility] Neurology  [place], [place]  [place], [place] [address] Tel: [phone] Fax:  [phone] Test Date:  01/13/2021  Patient: Laura [name] DOB: [birth year] Physician: Narda Amber, DO  Sex: Female Height: 5\' 4"  Ref Phys: Alonza Bogus, D.O.  ID#: [id number]   Technician:    Patient Complaints: This is a 60 year old female with left CTS release referred for evaluation of bilateral hand paresthesias, worse on the right.  NCV & EMG Findings: Extensive electrodiagnostic testing of the right upper extremity and additional studies of the left shows:  1. Right median sensory response shows prolonged latency (4.3 ms) and reduced amplitude (7.5 V).  Left median, left mixed palmar, and bilateral ulnar sensory responses are within normal limits. 2. Right median motor response shows prolonged latency (5.2 ms) and reduced amplitude (2.9 mV).  Left median and right ulnar motor responses are within normal limits.  Right ulnar motor response shows slowed conduction velocity across the elbow (A Elbow-B Elbow, 42 m/s).  Of note, there is evidence of bilateral Martin-Gruber anastomosis, a normal anatomic variant. 3. Chronic motor axonal loss changes are seen affecting the deltoid and biceps muscles, without accompanying active denervation.  Impression: 1. Right median neuropathy at or distal to the wrist (severe), consistent with a clinical diagnosis of carpal tunnel syndrome.   2. Left ulnar neuropathy with slowing across the elbow, mild. 3. Chronic C5-6 radiculopathy affecting bilateral upper extremities, mild-to-moderate.   ___________________________ Narda Amber, DO    Nerve Conduction Studies Anti Sensory Summary Table   Stim Site NR Peak (ms) Norm Peak (ms) P-T Amp (V) Norm P-T Amp  Left Median Anti Sensory (2nd Digit)  34C  Wrist    2.8 <3.6 23.9 >15  Right Median Anti Sensory (2nd Digit)  34C  Wrist    4.3 <3.6 7.5 >15  Left Ulnar Anti Sensory (5th Digit)  34C   Wrist    2.5 <3.1 25.6 >10  Right Ulnar Anti Sensory (5th Digit)  34C  Wrist    2.4 <3.1 21.3 >10   Motor Summary Table   Stim Site NR Onset (ms) Norm Onset (ms) O-P Amp (mV) Norm O-P Amp Site1 Site2 Delta-0 (ms) Dist (cm) Vel (m/s) Norm Vel (m/s)  Left Median Motor (Abd Poll Brev)  34C  Wrist    3.0 <4.0 6.4 >6 Elbow Wrist 3.9 26.0 67 >50  Elbow    6.9  6.4  Ulnar-wrist crossover Elbow 3.7 0.0    Ulnar-wrist crossover    3.2  4.6         Right Median Motor (Abd Poll Brev)  34C  Wrist    5.2 <4.0 2.9 >6 Elbow Wrist 4.7 25.0 53 >50  Elbow    9.9  2.4  Ulnar-wrist crossver Elbow 6.5 0.0    Ulnar-wrist crossver    3.4  5.1         Left Ulnar Motor (Abd Dig Minimi)  34C  Wrist    1.7 <3.1 7.1 >7 B Elbow Wrist 3.0 19.0 63 >50  B Elbow    4.7  6.9  A Elbow B Elbow 2.4 10.0 42 >50  A Elbow    7.1  6.8         Right Ulnar Motor (Abd Dig Minimi)  34C  Wrist    1.8 <3.1 8.5 >7 B Elbow Wrist 3.2 20.0 63 >50  B Elbow    5.0  8.2  A Elbow B Elbow 1.3 8.0 62 >50  A Elbow  6.3  7.9          Comparison Summary Table   Stim Site NR Peak (ms) Norm Peak (ms) P-T Amp (V) Site1 Site2 Delta-P (ms) Norm Delta (ms)  Left Median/Ulnar Palm Comparison (Wrist - 8cm)  34C  Median Palm    1.8 <2.2 56.8 Median Palm Ulnar Palm 0.3   Ulnar Palm    1.5 <2.2 31.4       EMG   Side Muscle Ins Act Fibs Psw Fasc Number Recrt Dur Dur. Amp Amp. Poly Poly. Comment  Right 1stDorInt Nml Nml Nml Nml Nml Nml Nml Nml Nml Nml Nml Nml N/A  Right Abd Poll Brev Nml Nml Nml Nml Nml Nml Nml Nml Nml Nml Nml Nml N/A  Right PronatorTeres Nml Nml Nml Nml Nml Nml Nml Nml Nml Nml Nml Nml N/A  Right Biceps Nml Nml Nml Nml 1- Rapid Few 1+ Few 1+ Few 1+ N/A  Right Triceps Nml Nml Nml Nml Nml Nml Nml Nml Nml Nml Nml Nml N/A  Right Deltoid Nml Nml Nml Nml 1- Rapid Some 1+ Some 1+ Some 1+ N/A  Left 1stDorInt Nml Nml Nml Nml Nml Nml Nml Nml Nml Nml Nml Nml N/A  Left Abd Poll Brev Nml Nml Nml Nml Nml Nml Nml Nml Nml Nml Nml Nml  N/A  Left PronatorTeres Nml Nml Nml Nml Nml Nml Nml Nml Nml Nml Nml Nml N/A  Left Biceps Nml Nml Nml Nml 1- Rapid Few 1+ Few 1+ Few 1+ N/A  Left Triceps Nml Nml Nml Nml Nml Nml Nml Nml Nml Nml Nml Nml N/A  Left Deltoid Nml Nml Nml Nml 1- Rapid Some 1+ Some 1+ Some 1+ N/A  Left ABD Dig Min Nml Nml Nml Nml Nml Nml Nml Nml Nml Nml Nml Nml N/A  Left FlexCarpiUln Nml Nml Nml Nml Nml Nml Nml Nml Nml Nml Nml Nml N/A      Waveforms:

## 2021-01-14 ENCOUNTER — Ambulatory Visit (INDEPENDENT_AMBULATORY_CARE_PROVIDER_SITE_OTHER): Payer: Medicare Other | Admitting: Podiatry

## 2021-01-14 ENCOUNTER — Encounter (HOSPITAL_COMMUNITY): Payer: Self-pay | Admitting: Physical Therapy

## 2021-01-14 ENCOUNTER — Ambulatory Visit (HOSPITAL_COMMUNITY): Payer: Medicare Other | Admitting: Physical Therapy

## 2021-01-14 DIAGNOSIS — M545 Low back pain, unspecified: Secondary | ICD-10-CM

## 2021-01-14 DIAGNOSIS — M778 Other enthesopathies, not elsewhere classified: Secondary | ICD-10-CM | POA: Diagnosis not present

## 2021-01-14 DIAGNOSIS — M79605 Pain in left leg: Secondary | ICD-10-CM | POA: Diagnosis not present

## 2021-01-14 DIAGNOSIS — M542 Cervicalgia: Secondary | ICD-10-CM | POA: Diagnosis not present

## 2021-01-14 DIAGNOSIS — M25552 Pain in left hip: Secondary | ICD-10-CM

## 2021-01-14 MED ORDER — TRIAMCINOLONE ACETONIDE 40 MG/ML IJ SUSP
20.0000 mg | Freq: Once | INTRAMUSCULAR | Status: AC
  Administered 2021-01-14: 20 mg

## 2021-01-14 NOTE — Therapy (Signed)
[facility] [address] [place], [place], [address] Phone: [phone]   Fax:  [phone]  Physical Therapy Treatment  Patient Details  Name: Laura [name] MRN: [id number] Date of Birth: 08-06-1961 Referring Provider (PT): Matthew Saras [place]   Encounter Date: 01/14/2021   PT End of Session - 01/14/21 1142    Visit Number 12    Number of Visits 18    Date for PT Re-Evaluation 02/05/21    Authorization Type Medicare A, Med[employer] 2nd    Progress Note Due on Visit 30    PT Start Time 1145   pt 11 minutes late to session   PT Stop Time 1210    PT Time Calculation (min) 25 min    Activity Tolerance Patient tolerated treatment well    Behavior During Therapy [facility] for tasks assessed/performed           Past Medical History:  Diagnosis Date  . Asthma   . Atrial fibrillation ([place])   . Bipolar disorder ([place])   . Bronchiectasis ([place])   . CHF (congestive heart failure) ([place])   . Complication of anesthesia    "my Dr in [place] told me I had an allergic reaction to the combination of the pain meds and the anesthesia" she does not remember what happened but "I eneded up in the ICU for 5 days".  . H/O congenital atrial septal defect (ASD) repair   . Hypogammaglobulinemia ([place])   . Hypothyroid   . 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 Neuropathy   . Osteoarthritis   . PTSD (post-traumatic stress disorder)   . Recurrent sinusitis   . Short-term memory loss   . Sleep apnea   . TIA (transient ischemic attack)   . Tremors of nervous system    seeing PCP for this.  . Type 2 diabetes mellitus ([place])   . Urticaria   . Wound infection     Past Surgical History:  Procedure Laterality Date  . 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 CARDIAC SURGERY    . CARDIOVERSION    . CARDIOVERSION N/A 08/29/2018   Procedure: CARDIOVERSION;  Surgeon: Arnoldo Lenis, MD;  Location[facility];  Service: Endoscopy;  Laterality: N/A;  . [place]  .  LEFT HEART CATH AND CORONARY ANGIOGRAPHY N/A 08/30/2019   Procedure: LEFT HEART CATH AND CORONARY ANGIOGRAPHY;  Surgeon: [name], MD;  Location: [facility] LAB;  Service: Cardiovascular;  Laterality: N/A;  . NASAL SINUS SURGERY  2017    There were no vitals filed for this visit.   Subjective Assessment - 01/14/21 1149    Subjective States that she is feeling a lot  better. States that her neck exercises seem to be helping. States she tied her bakery apron behind her  for the first time yesterday.    Pertinent History DB, PTSD, SHF, BBipolar, tremors, depression    Limitations Sitting;Standing;Walking;House hold activities;Lifting    Diagnostic tests xrays    Patient Stated Goals to be able to walk a mile    Currently in Pain? Yes    Pain Score 3     Pain Location Neck    Pain Orientation Posterior;Lower    Pain Descriptors / Indicators Aching    Pain Onset More than a month ago    Pain Onset More than a month ago              [facility] PT Assessment - 01/14/21 0001  Assessment   Medical Diagnosis neck pain wiht radiculopathy and low back pain    Referring Provider (PT) Matthew Saras PA                         [facility] Adult PT Treatment/Exercise - 01/14/21 0001      Neck Exercises: Standing   Other Standing Exercises scapular protraction with red theraband 3x10, wall slides flexion 4x5 bilateral    Other Standing Exercises shoulder extension, shoulder rows- with red theraband 3x10 5" holds each      Neck Exercises: Seated   Cervical Isometrics Right lateral flexion;Left lateral flexion;5 secs;15 reps                    PT Short Term Goals - 01/07/21 1204      PT SHORT TERM GOAL #1   Title Patient will be independent with initial HEP and self-management strategies to improve functional outcomes    Baseline Reports compliance, demos good return    Time 3    Period Weeks    Status Achieved    Target Date 12/24/20      PT  SHORT TERM GOAL #2   Title Patient will be able to demonstrate at least 150 degrees of left active shoulder ROM while sitting to demosntrate improved shoulder mobility.    Baseline Current 128    Time 3    Period Weeks    Status On-going    Target Date 12/24/20      PT SHORT TERM GOAL #3   Title Patient will report at least 25% improvement in overall symptoms and/or function to demonstrate improved functional mobility    Baseline Reports 50%    Status Achieved             PT Long Term Goals - 01/07/21 1205      PT LONG TERM GOAL #1   Title Patient will improve on FOTO score to meet predicted outcomes to demonstrate improved functional mobility.    Time 6    Period Weeks    Status Achieved      PT LONG TERM GOAL #2   Title Patient will report at least 50% overall improvement in subjective complaint to indicate improvement in ability to perform ADLs.    Baseline Reports 50%    Time 6    Period Weeks    Status Achieved      PT LONG TERM GOAL #3   Title Patient will be able to walk a mile to demonstrate improved walking endurance    Time 6    Period Weeks    Status On-going                 Plan - 01/14/21 1142    Clinical Impression Statement Added additional theraband exercises today which were tolerated well. Fatigue with shoulder flexion noted. Session limited secondary to late arrival. No increase in pain noted. Will continue with current POC as tolerated.    Personal Factors and Comorbidities Comorbidity 3+;Comorbidity 2;Comorbidity 1    Comorbidities COPD, Asthma, HTN, A-fib, DM    Stability/Clinical Decision Making Evolving/Moderate complexity    Rehab Potential Good    PT Frequency 2x / week    PT Duration 4 weeks    PT Treatment/Interventions ADLs/Self Care Home Management;Aquatic Therapy;Biofeedback;Canalith Repostioning;Cryotherapy;Electrical Stimulation;Iontophoresis 4mg /ml Dexamethasone;Moist Heat;Traction;Balance training;Manual lymph drainage;Manual  techniques;Vestibular;Vasopneumatic Device;Taping;Splinting;Orthotic Fit/Training;Energy conservation;Stair training;Functional mobility training;Therapeutic activities;Therapeutic exercise;Gait training;DME Instruction;Patient/family education;Passive range of motion;Dry needling;Joint Manipulations;Spinal Manipulations;Compression  bandaging;Visual/perceptual remediation/compensation;Scar mobilization;Neuromuscular re-education;Ultrasound;Parrafin;Fluidtherapy;Contrast Bath    PT Next Visit Plan [place] core and postural strength progressions as tolerated. Add tandem on foam, step ups, band rows when ready.    PT Home Exercise Plan lumbar extension standing; 12/14/20: tandem stance and marching 3/17 scapular retraction, UT stretching, cervical AROM 3/24 shoulder adduction stretch    Consulted and Agree with Plan of Care Patient           Patient will benefit from skilled therapeutic intervention in order to improve the following deficits and impairments:  Abnormal gait,Impaired flexibility,Postural dysfunction,Improper body mechanics,Decreased range of motion,Decreased balance,Decreased mobility,Difficulty walking,Pain,Increased fascial restricitons,Decreased strength,Decreased activity tolerance,Hypomobility  Visit Diagnosis: Pain in left leg  Pain in left hip  Low back pain, unspecified back pain laterality, unspecified chronicity, unspecified whether sciatica present     Problem List Patient Active Problem List   Diagnosis Date Noted  . Other fatigue 12/10/2020  . Abdominal pain, chronic, right lower quadrant 12/08/2020  . Abnormal weight loss 12/08/2020  . Poor appetite 12/08/2020  . Abnormal EKG   . Chronic diastolic (congestive) heart failure ([place]) 08/29/2019  . Shortness of breath 08/29/2019  . Bipolar disorder ([place]) 08/10/2018  . Headache 08/10/2018  . H/O atrial flutter 08/10/2018  . OSA on CPAP 08/10/2018  . Anxiety 08/10/2018  . Uncontrolled type 2 diabetes mellitus with  hyperglycemia ([place]) 03/13/2018  . Hypothyroidism 03/13/2018  . Essential hypertension, benign 03/13/2018  . Mixed hyperlipidemia 03/13/2018  . Hypogammaglobulinemia ([place]) 02/06/2018  . Non-allergic rhinitis 02/06/2018  . Severe persistent asthma, uncomplicated 30/94/0768  . Pulmonary nodules 02/06/2018  . Bronchiectasis without complication ([place]) 08/81/1031  . DM type 2 causing vascular disease ([place]) 02/06/2018    12:19 PM, 01/14/21 Jerene Pitch, DPT Physical Therapy with [facility]  [phone] office   [place] [address] [place], [place], [address] Phone: [phone]   Fax:  [phone]  Name: Laura [name] MRN: [id number] Date of Birth: 10/21/1960

## 2021-01-15 NOTE — Patient Instructions (Signed)
Your procedure is scheduled on: 01/26/2021  Report to [facility] at 8:15    AM.  Call this number if you have problems the morning of surgery: [phone]   Remember:              Follow Directions on the letter you received from Your Physician's office regarding the Bowel Prep              No Smoking the day of Procedure :   Hold Eliquis 2 days as directed on letter from office   Take these medicines the morning of surgery with A SIP OF WATER: Depakote and hydralazine  No diabetic medication morning of procedure.  Take only 1/2 dose of lantus  22.5 units night before procedure   Do not wear jewelry, make-up or nail polish.    Do not bring valuables to the hospital.  Contacts, dentures or bridgework may not be worn into surgery.  .   Patients discharged the day of surgery will not be allowed to drive home.     Colonoscopy, Adult, Care After This sheet gives you information about how to care for yourself after your procedure. Your health care provider may also give you more specific instructions. If you have problems or questions, contact your health care provider. What can I expect after the procedure? After the procedure, it is common to have:  A small amount of blood in your stool for 24 hours after the procedure.  Some gas.  Mild abdominal cramping or bloating.  Follow these instructions at home: General instructions   For the first 24 hours after the procedure: ? Do not drive or use machinery. ? Do not sign important documents. ? Do not drink alcohol. ? Do your regular daily activities at a slower pace than normal. ? Eat soft, easy-to-digest foods. ? Rest often.  Take over-the-counter or prescription medicines only as told by your health care provider.  It is up to you to get the results of your procedure. Ask your health care provider, or the department performing the procedure, when your results will be ready. Relieving cramping and bloating  Try  walking around when you have cramps or feel bloated.  Apply heat to your abdomen as told by your health care provider. Use a heat source that your health care provider recommends, such as a moist heat pack or a heating pad. ? Place a towel between your skin and the heat source. ? Leave the heat on for 20-30 minutes. ? Remove the heat if your skin turns bright red. This is especially important if you are unable to feel pain, heat, or cold. You may have a greater risk of getting burned. Eating and drinking  Drink enough fluid to keep your urine clear or pale yellow.  Resume your normal diet as instructed by your health care provider. Avoid heavy or fried foods that are hard to digest.  Avoid drinking alcohol for as long as instructed by your health care provider. Contact a health care provider if:  You have blood in your stool 2-3 days after the procedure. Get help right away if:  You have more than a small spotting of blood in your stool.  You pass large blood clots in your stool.  Your abdomen is swollen.  You have nausea or vomiting.  You have a fever.  You have increasing abdominal pain that is not relieved with medicine. This information is not intended to replace advice given to you by your  health care provider. Make sure you discuss any questions you have with your health care provider. Document Released: 05/10/2004 Document Revised: 06/20/2016 Document Reviewed: 12/08/2015 Elsevier Interactive Patient Education  2018 [place] [facility], Adult, Care After This sheet gives you information about how to care for yourself after your procedure. Your health care provider may also give you more specific instructions. If you have problems or questions, contact your health care provider. What can I expect after the procedure? After the procedure, it is common to have:  A sore throat.  Mild stomach pain or discomfort.  Bloating.  Nausea. Follow these instructions  at home:  Follow instructions from your health care provider about what to eat or drink after your procedure.  Return to your normal activities as told by your health care provider. Ask your health care provider what activities are safe for you.  Take over-the-counter and prescription medicines only as told by your health care provider.  If you were given a sedative during the procedure, it can affect you for several hours. Do not drive or operate machinery until your health care provider says that it is safe.  Keep all follow-up visits as told by your health care provider. This is important.   Contact a health care provider if you have:  A sore throat that lasts longer than one day.  Trouble swallowing. Get help right away if:  You vomit blood or your vomit looks like coffee grounds.  You have: ? A fever. ? Bloody, black, or tarry stools. ? A severe sore throat or you cannot swallow. ? Difficulty breathing. ? Severe pain in your chest or abdomen. Summary  After the procedure, it is common to have a sore throat, mild stomach discomfort, bloating, and nausea.  If you were given a sedative during the procedure, it can affect you for several hours. Do not drive or operate machinery until your health care provider says that it is safe.  Follow instructions from your health care provider about what to eat or drink after your procedure.  Return to your normal activities as told by your health care provider. This information is not intended to replace advice given to you by your health care provider. Make sure you discuss any questions you have with your health care provider. Document Revised: 09/24/2019 Document Reviewed: 02/26/2018 Elsevier Patient Education  2021 [employer].

## 2021-01-16 NOTE — Progress Notes (Signed)
She presents today for follow-up of her plantar fasciitis x1 month left foot.  She states that she has been wearing her orthotics every day they have been helping with her walking however her hammertoe deformity there is been no improvement with that she says is aching.  States that hemoglobin A1c is still 7.7 she is still taking Eliquis.  Objective: Vital signs are stable alert oriented x3.  Pulses are palpable.  Neurologic sensorium is intact with slight diminishment of epicritic sensation per Semmes Weinstein monofilament.  Particular around the second metatarsophalangeal joint second toe of the right foot in particular.  She still has pain on palpation of the second toe at the metatarsophalangeal joint.  Assessment: Hammertoe deformity second right.  Otherwise general foot pain is resolving with orthotics.  She also has diabetic peripheral neuropathy   Plan: Discussed etiology pathology conservative versus surgical therapies at this point I injected around the second metatarsophalangeal joint with 10 mg Kenalog 5 mg Marcaine I requested that she follow-up with [facility] for diabetic shoes.

## 2021-01-18 ENCOUNTER — Ambulatory Visit (HOSPITAL_COMMUNITY): Payer: Medicare Other | Admitting: Physical Therapy

## 2021-01-18 ENCOUNTER — Other Ambulatory Visit: Payer: Self-pay

## 2021-01-18 DIAGNOSIS — M25552 Pain in left hip: Secondary | ICD-10-CM | POA: Diagnosis not present

## 2021-01-18 DIAGNOSIS — M79605 Pain in left leg: Secondary | ICD-10-CM

## 2021-01-18 DIAGNOSIS — M542 Cervicalgia: Secondary | ICD-10-CM | POA: Diagnosis not present

## 2021-01-18 DIAGNOSIS — M545 Low back pain, unspecified: Secondary | ICD-10-CM

## 2021-01-19 ENCOUNTER — Encounter: Payer: Self-pay | Admitting: Adult Health

## 2021-01-19 ENCOUNTER — Ambulatory Visit (INDEPENDENT_AMBULATORY_CARE_PROVIDER_SITE_OTHER): Payer: Medicare Other | Admitting: Adult Health

## 2021-01-19 ENCOUNTER — Encounter (HOSPITAL_COMMUNITY): Payer: Self-pay | Admitting: Physical Therapy

## 2021-01-19 VITALS — BP 137/61 | HR 70 | Ht 64.0 in | Wt 202.0 lb

## 2021-01-19 DIAGNOSIS — G8929 Other chronic pain: Secondary | ICD-10-CM | POA: Diagnosis not present

## 2021-01-19 DIAGNOSIS — Z1231 Encounter for screening mammogram for malignant neoplasm of breast: Secondary | ICD-10-CM

## 2021-01-19 DIAGNOSIS — N3946 Mixed incontinence: Secondary | ICD-10-CM | POA: Insufficient documentation

## 2021-01-19 DIAGNOSIS — R1031 Right lower quadrant pain: Secondary | ICD-10-CM | POA: Diagnosis not present

## 2021-01-19 DIAGNOSIS — N949 Unspecified condition associated with female genital organs and menstrual cycle: Secondary | ICD-10-CM | POA: Diagnosis not present

## 2021-01-19 NOTE — Progress Notes (Signed)
Patient ID: Laura [name], female   DOB: 1961/09/21, 60 y.o.   MRN: [id number] History of Present Illness: Laura [name] is a 60 year old white female, widowed, PM referred by Dr Nevada Crane for pelvic pain and cyst on ovary. She says she has pain RLQ for over a year, and had CT 12/09/20 that showe d2 cm right adnexal cyst. PCP is Dr Nevada Crane.   Current Medications, Allergies, Past Medical History, Past Surgical History, Family History and Social History were reviewed in [employer] record.     Review of Systems: Has had pain in RLQ for over a year, almost feels like when used to have periods  No vaginal bleeding Not having sex Has urinary incontinence and has seen urology Has constipation and diarrhea and having EGD and colonoscopy 01/25/21      Physical Exam:BP 137/61 (BP Location: Left Arm, Patient Position: Sitting, Cuff Size: Large)   Pulse 70   Ht 5\' 4"  (1.626 m)   Wt 202 lb (91.6 kg)   BMI 34.67 kg/m  General:  Well developed, well nourished, no acute distress Skin:  Warm and dry Lungs; Clear to auscultation bilaterally Cardiovascular: Regular rate and rhythm Pelvic:  External genitalia is normal in appearance, no lesions.  The vagina is normal in appearance. Urethra has no lesions or masses. The cervix is smooth.  Uterus is felt to be normal size, shape, and contour.  No adnexal masses, +RLQ tenderness noted.Bladder is non tender, no masses felt. Psych:  No mood changes, alert and cooperative,seems happy AA is 0  Fall risk is high PHQ 9 score is 3 GAD 7 score is 0  Upstream - 01/19/21 1434      Pregnancy Intention Screening   Does the patient want to become pregnant in the next year? N/A    Does the patient's partner want to become pregnant in the next year? N/A    Would the patient like to discuss contraceptive options today? N/A      Contraception Wrap Up   Current Method No Method - Other Reason   postmenopausal   End Method No Method - Other Reason    postmenopausal   Contraception Counseling Provided No         Examination chaperoned by Levy Pupa LPN   Impression and Plan: 1. Chronic RLQ pain Will get pelvic US at [facility] 01/27/21 at 10:30 am  Will follow up in 2 weeks   2. Adnexal cyst Will get [place]  3. Mixed stress and urge urinary incontinence Seeing urologist   4. Screening mammogram for breast cancer Call for mammogram appt

## 2021-01-19 NOTE — Therapy (Signed)
[place], [place], [address] Phone: [phone]   Fax:  [phone]  Physical Therapy Treatment  Patient Details  Name: Nainika Newlun MRN: [id number] Date of Birth: 1961/05/19 Referring Provider (PT): Matthew Saras [place]   Encounter Date: 01/18/2021   PT End of Session - 01/19/21 1701    Visit Number 13    Number of Visits 18    Date for PT Re-Evaluation 02/05/21    Authorization Type Medicare A, Medcaid 2nd    Progress Note Due on Visit 18    PT Start Time 1502    PT Stop Time 1550    PT Time Calculation (min) 48 min    Activity Tolerance Patient tolerated treatment well    Behavior During Therapy [facility] for tasks assessed/performed           Past Medical History:  Diagnosis Date  . Asthma   . Atrial fibrillation ([place])   . Bipolar disorder ([place])   . Bronchiectasis ([place])   . Carpal tunnel syndrome of right wrist   . CHF (congestive heart failure) ([place])   . Complication of anesthesia    "my Dr in [place] told me I had an allergic reaction to the combination of the pain meds and the anesthesia" she does not remember what happened but "I eneded up in the ICU for 5 days".  . H/O congenital atrial septal defect (ASD) repair   . Hypogammaglobulinemia ([place])   . Hypothyroid   . 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 Neuropathy   . Neuropathy   . Osteoarthritis   . Pinched nerve in neck   . PTSD (post-traumatic stress disorder)   . Recurrent sinusitis   . Short-term memory loss   . Sleep apnea   . TIA (transient ischemic attack)   . Tremors of nervous system    seeing PCP for this.  . Type 2 diabetes mellitus ([place])   . Urticaria   . Wound infection     Past Surgical History:  Procedure Laterality Date  . 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 CARDIAC SURGERY    . CARDIOVERSION    . CARDIOVERSION N/A 08/29/2018   Procedure: CARDIOVERSION;  Surgeon: Arnoldo Lenis, MD;  [facility];  Service: Endoscopy;   Laterality: N/A;  . [place]  . LEFT HEART CATH AND CORONARY ANGIOGRAPHY N/A 08/30/2019   Procedure: LEFT HEART CATH AND CORONARY ANGIOGRAPHY;  Surgeon: [name], MD;  Location: [place] CV LAB;  Service: Cardiovascular;  Laterality: N/A;  . NASAL SINUS SURGERY  2017    There were no vitals filed for this visit.   Subjective Assessment - 01/19/21 1700    Subjective Patient states her neck is a little stiff today, "about a 3". States exercises are going well.    Pertinent History DB, PTSD, SHF, BBipolar, tremors, depression    Limitations Sitting;Standing;Walking;House hold activities;Lifting    Diagnostic tests xrays    Patient Stated Goals to be able to walk a mile    Currently in Pain? Yes    Pain Score 3     Pain Location Neck    Pain Orientation Posterior    Pain Descriptors / Indicators Aching;Tightness    Pain Type Chronic pain    Pain Onset More than a month ago    Pain Frequency Constant    Pain Onset More than a month ago  Adult Aquatic Therapy - 01/19/21 1706      Treatment   Gait Dynamic warmup: pool walking, sidestepping, retro walking with floats 3 RT each    Exercises cervical rotation x10, heel raise x20, DB push pull 3 x 10, alternating DB row 3 x10, DB twist 3 x 10, DB butterflies x30, DB adduction 2 x10, single arm pull down 2 x 10, double arm pull down x 10, dumbbell chops x20                        PT Short Term Goals - 01/07/21 1204      PT SHORT TERM GOAL #1   Title Patient will be independent with initial HEP and self-management strategies to improve functional outcomes    Baseline Reports compliance, demos good return    Time 3    Period Weeks    Status Achieved    Target Date 12/24/20      PT SHORT TERM GOAL #2   Title Patient will be able to demonstrate at least 150 degrees of left active shoulder ROM while sitting to demosntrate improved shoulder  mobility.    Baseline Current 128    Time 3    Period Weeks    Status On-going    Target Date 12/24/20      PT SHORT TERM GOAL #3   Title Patient will report at least 25% improvement in overall symptoms and/or function to demonstrate improved functional mobility    Baseline Reports 50%    Status Achieved             PT Long Term Goals - 01/07/21 1205      PT LONG TERM GOAL #1   Title Patient will improve on FOTO score to meet predicted outcomes to demonstrate improved functional mobility.    Time 6    Period Weeks    Status Achieved      PT LONG TERM GOAL #2   Title Patient will report at least 50% overall improvement in subjective complaint to indicate improvement in ability to perform ADLs.    Baseline Reports 50%    Time 6    Period Weeks    Status Achieved      PT LONG TERM GOAL #3   Title Patient will be able to walk a mile to demonstrate improved walking endurance    Time 6    Period Weeks    Status On-going                 Plan - 01/19/21 1702    Clinical Impression Statement Patient tolerated session well. Able to perform with reduced rest times. Patient showing improved cervical and lumbar mobility with dumbbell twists and chops. No increased complaints of pain today. Patient will continue to benefit from skilled therapy services to progress core and postural strength for reduced pain and improved functional ability.    Personal Factors and Comorbidities Comorbidity 3+;Comorbidity 2;Comorbidity 1    Comorbidities COPD, Asthma, HTN, A-fib, DM    Stability/Clinical Decision Making Evolving/Moderate complexity    Rehab Potential Good    PT Frequency 2x / week    PT Duration 4 weeks    PT Treatment/Interventions ADLs/Self Care Home Management;Aquatic Therapy;Biofeedback;Canalith Repostioning;Cryotherapy;Electrical Stimulation;Iontophoresis 4mg /ml Dexamethasone;Moist Heat;Traction;Balance training;Manual lymph drainage;Manual  techniques;Vestibular;Vasopneumatic Device;Taping;Splinting;Orthotic Fit/Training;Energy conservation;Stair training;Functional mobility training;Therapeutic activities;Therapeutic exercise;Gait training;DME Instruction;Patient/family education;Passive range of motion;Dry needling;Joint Manipulations;Spinal Manipulations;Compression bandaging;Visual/perceptual remediation/compensation;Scar mobilization;Neuromuscular re-education;Ultrasound;Parrafin;Fluidtherapy;Contrast Bath    PT Next Visit Plan [place] core  and postural strength progressions as tolerated. Add tandem on foam, step ups, band rows when ready.    PT Home Exercise Plan lumbar extension standing; 12/14/20: tandem stance and marching 3/17 scapular retraction, UT stretching, cervical AROM 3/24 shoulder adduction stretch    Consulted and Agree with Plan of Care Patient           Patient will benefit from skilled therapeutic intervention in order to improve the following deficits and impairments:  Abnormal gait,Impaired flexibility,Postural dysfunction,Improper body mechanics,Decreased range of motion,Decreased balance,Decreased mobility,Difficulty walking,Pain,Increased fascial restricitons,Decreased strength,Decreased activity tolerance,Hypomobility  Visit Diagnosis: Pain in left leg  Pain in left hip  Low back pain, unspecified back pain laterality, unspecified chronicity, unspecified whether sciatica present     Problem List Patient Active Problem List   Diagnosis Date Noted  . Adnexal cyst 01/19/2021  . Chronic RLQ pain 01/19/2021  . Mixed stress and urge urinary incontinence 01/19/2021  . Other fatigue 12/10/2020  . Abdominal pain, chronic, right lower quadrant 12/08/2020  . Abnormal weight loss 12/08/2020  . Poor appetite 12/08/2020  . Abnormal EKG   . Chronic diastolic (congestive) heart failure ([place]) 08/29/2019  . Shortness of breath 08/29/2019  . Bipolar disorder ([place]) 08/10/2018  . Headache 08/10/2018  . H/O  atrial flutter 08/10/2018  . OSA on CPAP 08/10/2018  . Anxiety 08/10/2018  . Uncontrolled type 2 diabetes mellitus with hyperglycemia ([place]) 03/13/2018  . Hypothyroidism 03/13/2018  . Essential hypertension, benign 03/13/2018  . Mixed hyperlipidemia 03/13/2018  . Hypogammaglobulinemia ([place]) 02/06/2018  . Non-allergic rhinitis 02/06/2018  . Severe persistent asthma, uncomplicated 95/18/8416  . Pulmonary nodules 02/06/2018  . Bronchiectasis without complication ([place]) 60/63/0160  . DM type 2 causing vascular disease ([place]) 02/06/2018   5:09 PM, 01/19/21 Josue Hector PT DPT  Physical Therapist with [facility]  [phone] [place] [address] [place], [place], [address] Phone: [phone]   Fax:  [phone]  Name: Sigourney Portillo MRN: [id number] Date of Birth: 1961/05/15

## 2021-01-20 ENCOUNTER — Other Ambulatory Visit: Payer: Self-pay

## 2021-01-20 ENCOUNTER — Encounter (HOSPITAL_COMMUNITY)
Admission: RE | Admit: 2021-01-20 | Discharge: 2021-01-20 | Disposition: A | Discharge status: Home / Self Care | Payer: Medicare Other | Source: Ambulatory Visit | Attending: Internal Medicine | Admitting: Internal Medicine

## 2021-01-20 ENCOUNTER — Encounter (HOSPITAL_COMMUNITY): Payer: Self-pay

## 2021-01-21 ENCOUNTER — Ambulatory Visit (INDEPENDENT_AMBULATORY_CARE_PROVIDER_SITE_OTHER): Payer: Medicare Other | Admitting: Clinical

## 2021-01-21 ENCOUNTER — Ambulatory Visit (HOSPITAL_COMMUNITY): Payer: Medicare Other | Admitting: Physical Therapy

## 2021-01-21 ENCOUNTER — Encounter (HOSPITAL_COMMUNITY): Payer: Self-pay

## 2021-01-21 ENCOUNTER — Encounter (HOSPITAL_COMMUNITY): Payer: Self-pay | Admitting: Physical Therapy

## 2021-01-21 DIAGNOSIS — R519 Headache, unspecified: Secondary | ICD-10-CM | POA: Diagnosis not present

## 2021-01-21 DIAGNOSIS — F4310 Post-traumatic stress disorder, unspecified: Secondary | ICD-10-CM

## 2021-01-21 DIAGNOSIS — M545 Low back pain, unspecified: Secondary | ICD-10-CM

## 2021-01-21 DIAGNOSIS — M79605 Pain in left leg: Secondary | ICD-10-CM | POA: Diagnosis not present

## 2021-01-21 DIAGNOSIS — M25552 Pain in left hip: Secondary | ICD-10-CM | POA: Diagnosis not present

## 2021-01-21 DIAGNOSIS — E1140 Type 2 diabetes mellitus with diabetic neuropathy, unspecified: Secondary | ICD-10-CM | POA: Diagnosis not present

## 2021-01-21 DIAGNOSIS — W1830XD Fall on same level, unspecified, subsequent encounter: Secondary | ICD-10-CM | POA: Diagnosis not present

## 2021-01-21 DIAGNOSIS — M25562 Pain in left knee: Secondary | ICD-10-CM | POA: Diagnosis not present

## 2021-01-21 DIAGNOSIS — R350 Frequency of micturition: Secondary | ICD-10-CM | POA: Diagnosis not present

## 2021-01-21 DIAGNOSIS — M542 Cervicalgia: Secondary | ICD-10-CM | POA: Diagnosis not present

## 2021-01-21 DIAGNOSIS — M25531 Pain in right wrist: Secondary | ICD-10-CM | POA: Diagnosis not present

## 2021-01-21 NOTE — Progress Notes (Signed)
Virtual Visit via Video Note  I connected withTamara [name] 04/14/22at 2:00 PM ESTby a video enabled telemedicine application and verified that I am speaking with the correct person using two identifiers.  Location: Patient:Home Provider:Office  I discussed the limitations of evaluation and management by telemedicine and the availability of in person appointments. The patient expressed understanding and agreed to proceed.    THERAPIST PROGRESS NOTE  Session Time:2:00PM-2:45PM  Participation Level:Active  Behavioral Response:CasualAlertDepressed  Type of Therapy:Individual Therapy  Treatment Goals addressed:Coping  Interventions:CBT, DBT, Solution Focused, Strength-based and Supportive  Summary:Laura Leppertis a 60 y.o.femalewho presents with PTSD.The OPT therapist worked with thepatientfor herongoing OPT treatmentsession. The OPT therapist utilized Motivational Interviewing to assist in creating therapeutic repore. The patient in the session was engaged and work in Science writer about hertriggers and symptoms over the past few weeksincluding ongoing recovery from significant bruising from a parking lot fall.The OPT therapist utilized Cognitive Behavioral Therapy through cognitive restructuring as well as worked with the patient on coping strategies to assist in management ofmental healthsymptoms as well asongoingwork tochallenge current negative automatic thoughts and improve the patients thought processes.The patientspoke of her acknowledgement that she may have fell over her feet, but also may have tripped over broken pavement at [facility] which would make [employer] liable.The OPT therapist continued to give support aroundcontinuing to follow her health treatment plans.  Suicidal/Homicidal:Nowithout intent/plan  Therapist Response:The OPT therapist worked with the patient for the patients scheduled session. The  patient was engaged in hersession and gave feedback in relation to triggers, symptoms, and behavior responses over the pastfewweeks. The OPT therapist worked with the patient utilizing an in session Cognitive Behavioral Therapy exercise. The patient was responsive in the session and verbalized, "I am still recovering I still have not seen the surveillance video from my fall, I have been focused on my health and recovering".The OPT therapistvalidated the patients feelings and conveyed ongoing remorse for her injury and pain from the injury related to her recent fall at a local store .The OPT therapist will continue treatment work with the patient in hernext scheduled session  Plan: Return again in2/3weeks.  Diagnosis:Axis I:Post Traumatic Stress Disorder  Axis II:No diagnosis  I discussed the assessment and treatment plan with the patient. The patient was provided an opportunity to ask questions and all were answered. The patient agreed with the plan and demonstrated an understanding of the instructions.  The patient was advised to call back or seek an in-person evaluation if the symptoms worsen or if the condition fails to improve as anticipated.  I provided29minutes of non-face-to-face time during this encounter.  [name], LCSW  01/21/2021

## 2021-01-21 NOTE — Therapy (Signed)
[place], [place], [address] Phone: [phone]   Fax:  [phone]  Physical Therapy Treatment  Patient Details  Name: Laura [name] MRN: [id number] Date of Birth: 08-17-1961 Referring Provider (PT): Matthew Saras [place]   Encounter Date: 01/21/2021   PT End of Session - 01/21/21 1532    Visit Number 14    Number of Visits 18    Date for PT Re-Evaluation 02/05/21    Authorization Type Medicare A, Medcaid 2nd    Progress Note Due on Visit 18    PT Start Time 1525    PT Stop Time 1605    PT Time Calculation (min) 40 min    Activity Tolerance Patient tolerated treatment well    Behavior During Therapy [facility] for tasks assessed/performed           Past Medical History:  Diagnosis Date  . Asthma   . Atrial fibrillation ([place])   . Bipolar disorder ([place])   . Bronchiectasis ([place])   . Carpal tunnel syndrome of right wrist   . CHF (congestive heart failure) ([place])   . Complication of anesthesia    "my Dr in [place] told me I had an allergic reaction to the combination of the pain meds and the anesthesia" she does not remember what happened but "I eneded up in the ICU for 5 days".  . H/O congenital atrial septal defect (ASD) repair   . Hypogammaglobulinemia ([place])   . Hypothyroid   . 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 Neuropathy   . Neuropathy   . Osteoarthritis   . Pinched nerve in neck   . PTSD (post-traumatic stress disorder)   . Recurrent sinusitis   . Short-term memory loss   . Sleep apnea   . TIA (transient ischemic attack)   . Tremors of nervous system    seeing PCP for this.  . Type 2 diabetes mellitus ([place])   . Urticaria   . Wound infection     Past Surgical History:  Procedure Laterality Date  . 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 CARDIAC SURGERY    . CARDIOVERSION    . CARDIOVERSION N/A 08/29/2018   Procedure: CARDIOVERSION;  Surgeon: Arnoldo Lenis, MD;  [facility];  Service: Endoscopy;   Laterality: N/A;  . [place]  . LEFT HEART CATH AND CORONARY ANGIOGRAPHY N/A 08/30/2019   Procedure: LEFT HEART CATH AND CORONARY ANGIOGRAPHY;  Surgeon: [name], MD;  Location: [place] CV LAB;  Service: Cardiovascular;  Laterality: N/A;  . NASAL SINUS SURGERY  2017    There were no vitals filed for this visit.   Subjective Assessment - 01/21/21 1526    Subjective Patient says he neck is a little tight. The neurologist told her she has carpal tunnel, a pinched nerve, and neuropathy    Pertinent History DB, PTSD, SHF, BBipolar, tremors, depression    Limitations Sitting;Standing;Walking;House hold activities;Lifting    Diagnostic tests xrays    Patient Stated Goals to be able to walk a mile    Currently in Pain? Yes    Pain Score 3     Pain Location Neck    Pain Orientation Right;Posterior    Pain Descriptors / Indicators Aching;Tightness    Pain Type Chronic pain    Pain Onset More than a month ago    Pain Frequency Constant    Pain Onset More than a month ago  [place] Adult PT Treatment/Exercise - 01/21/21 0001      Neck Exercises: Seated   Other Seated Exercise chin tuck x10, scapular retraction x10, UT stretching 3 x 30" each, standing shoulder flexion/ scaption 2# x10 each, band rows RTB 2 x 10, band extension 2 x 10 RTB      Manual Therapy   Manual Therapy Soft tissue mobilization    Manual therapy comments all manual interventions performed independently of other intervention    Soft tissue mobilization IASTM to bilateral upper trap, levator, cervical paraspinals                    PT Short Term Goals - 01/07/21 1204      PT SHORT TERM GOAL #1   Title Patient will be independent with initial HEP and self-management strategies to improve functional outcomes    Baseline Reports compliance, demos good return    Time 3    Period Weeks    Status Achieved    Target Date  12/24/20      PT SHORT TERM GOAL #2   Title Patient will be able to demonstrate at least 150 degrees of left active shoulder ROM while sitting to demosntrate improved shoulder mobility.    Baseline Current 128    Time 3    Period Weeks    Status On-going    Target Date 12/24/20      PT SHORT TERM GOAL #3   Title Patient will report at least 25% improvement in overall symptoms and/or function to demonstrate improved functional mobility    Baseline Reports 50%    Status Achieved             PT Long Term Goals - 01/07/21 1205      PT LONG TERM GOAL #1   Title Patient will improve on FOTO score to meet predicted outcomes to demonstrate improved functional mobility.    Time 6    Period Weeks    Status Achieved      PT LONG TERM GOAL #2   Title Patient will report at least 50% overall improvement in subjective complaint to indicate improvement in ability to perform ADLs.    Baseline Reports 50%    Time 6    Period Weeks    Status Achieved      PT LONG TERM GOAL #3   Title Patient will be able to walk a mile to demonstrate improved walking endurance    Time 6    Period Weeks    Status On-going                 Plan - 01/21/21 1623    Clinical Impression Statement Patient well challenged with exercise progressions. Showing good cervical mobility and tolerance for scapular strengthening. Had difficulty with added shoulder flexion and scaption with 2lb dumbbells. Notes increased muscle fatigue in upper trapezius area. Requires continued verbal cues for retracting scapulae and avoiding shoulder elevation. Noted increased cervical mobility and decreased pain post manual treatment. Patient issued band rows for HEP. Will continue to benefit from skilled therapy services to reduce limitations and improve functional ability.    Personal Factors and Comorbidities Comorbidity 3+;Comorbidity 2;Comorbidity 1    Comorbidities COPD, Asthma, HTN, A-fib, DM    Stability/Clinical Decision  Making Evolving/Moderate complexity    Rehab Potential Good    PT Frequency 2x / week    PT Duration 4 weeks    PT Treatment/Interventions ADLs/Self Care Home Management;Aquatic Therapy;Biofeedback;Canalith Repostioning;Cryotherapy;Electrical Stimulation;Iontophoresis  4mg /ml Dexamethasone;Moist Heat;Traction;Balance training;Manual lymph drainage;Manual techniques;Vestibular;Vasopneumatic Device;Taping;Splinting;Orthotic Fit/Training;Energy conservation;Stair training;Functional mobility training;Therapeutic activities;Therapeutic exercise;Gait training;DME Instruction;Patient/family education;Passive range of motion;Dry needling;Joint Manipulations;Spinal Manipulations;Compression bandaging;Visual/perceptual remediation/compensation;Scar mobilization;Neuromuscular re-education;Ultrasound;Parrafin;Fluidtherapy;Contrast Bath    PT Next Visit Plan Continue postural strength progressions. Add high band row, scapular depression, pec stretch.    PT Home Exercise Plan lumbar extension standing; 12/14/20: tandem stance and marching 3/17 scapular retraction, UT stretching, cervical AROM 3/24 shoulder adduction stretch 4/14 band rows    Consulted and Agree with Plan of Care Patient           Patient will benefit from skilled therapeutic intervention in order to improve the following deficits and impairments:  Abnormal gait,Impaired flexibility,Postural dysfunction,Improper body mechanics,Decreased range of motion,Decreased balance,Decreased mobility,Difficulty walking,Pain,Increased fascial restricitons,Decreased strength,Decreased activity tolerance,Hypomobility  Visit Diagnosis: Pain in left leg  Pain in left hip  Low back pain, unspecified back pain laterality, unspecified chronicity, unspecified whether sciatica present     Problem List Patient Active Problem List   Diagnosis Date Noted  . Adnexal cyst 01/19/2021  . Chronic RLQ pain 01/19/2021  . Mixed stress and urge urinary incontinence  01/19/2021  . Other fatigue 12/10/2020  . Abdominal pain, chronic, right lower quadrant 12/08/2020  . Abnormal weight loss 12/08/2020  . Poor appetite 12/08/2020  . Abnormal EKG   . Chronic diastolic (congestive) heart failure ([place]) 08/29/2019  . Shortness of breath 08/29/2019  . Bipolar disorder ([place]) 08/10/2018  . Headache 08/10/2018  . H/O atrial flutter 08/10/2018  . OSA on CPAP 08/10/2018  . Anxiety 08/10/2018  . Uncontrolled type 2 diabetes mellitus with hyperglycemia ([place]) 03/13/2018  . Hypothyroidism 03/13/2018  . Essential hypertension, benign 03/13/2018  . Mixed hyperlipidemia 03/13/2018  . Hypogammaglobulinemia ([place]) 02/06/2018  . Non-allergic rhinitis 02/06/2018  . Severe persistent asthma, uncomplicated 55/97/4163  . Pulmonary nodules 02/06/2018  . Bronchiectasis without complication ([place]) 84/53/6468  . DM type 2 causing vascular disease ([place]) 02/06/2018   4:41 PM, 01/21/21 Josue Hector PT DPT  Physical Therapist with [facility]  [phone] [place] [address] [place], [place], [address] Phone: [phone]   Fax:  [phone]  Name: Laura [name] MRN: [id number] Date of Birth: 1961-08-13

## 2021-01-25 ENCOUNTER — Ambulatory Visit (HOSPITAL_COMMUNITY): Payer: Medicare Other | Admitting: Physical Therapy

## 2021-01-25 ENCOUNTER — Other Ambulatory Visit: Payer: Self-pay

## 2021-01-25 ENCOUNTER — Other Ambulatory Visit (HOSPITAL_COMMUNITY)
Admission: RE | Admit: 2021-01-25 | Discharge: 2021-01-25 | Disposition: A | Discharge status: Home / Self Care | Payer: Medicare Other | Source: Ambulatory Visit | Attending: Internal Medicine | Admitting: Internal Medicine

## 2021-01-25 DIAGNOSIS — Z01812 Encounter for preprocedural laboratory examination: Secondary | ICD-10-CM | POA: Diagnosis not present

## 2021-01-25 DIAGNOSIS — Z20822 Contact with and (suspected) exposure to covid-19: Secondary | ICD-10-CM | POA: Diagnosis not present

## 2021-01-25 DIAGNOSIS — E1165 Type 2 diabetes mellitus with hyperglycemia: Secondary | ICD-10-CM

## 2021-01-25 LAB — SARS CORONAVIRUS 2 (TAT 6-24 HRS): SARS Coronavirus 2: NEGATIVE

## 2021-01-25 MED ORDER — BLOOD GLUCOSE METER KIT: 1.0000 | PACK | Freq: Four times a day (QID) | 0 refills | Status: DC
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
## 2021-01-26 ENCOUNTER — Ambulatory Visit: Payer: Medicare Other | Admitting: Nutrition

## 2021-01-26 ENCOUNTER — Ambulatory Visit (HOSPITAL_COMMUNITY): Payer: Medicare Other | Admitting: Anesthesiology

## 2021-01-26 ENCOUNTER — Telehealth: Payer: Self-pay

## 2021-01-26 ENCOUNTER — Encounter (HOSPITAL_COMMUNITY)
Admission: RE | Disposition: A | Discharge status: Home / Self Care | Payer: Self-pay | Source: Home / Self Care | Attending: Internal Medicine

## 2021-01-26 ENCOUNTER — Encounter (HOSPITAL_COMMUNITY): Payer: Self-pay

## 2021-01-26 ENCOUNTER — Ambulatory Visit (HOSPITAL_COMMUNITY)
Admission: RE | Admit: 2021-01-26 | Discharge: 2021-01-26 | Disposition: A | Discharge status: Home / Self Care | Payer: Medicare Other | Attending: Internal Medicine | Admitting: Internal Medicine

## 2021-01-26 DIAGNOSIS — R110 Nausea: Secondary | ICD-10-CM | POA: Diagnosis not present

## 2021-01-26 DIAGNOSIS — R194 Change in bowel habit: Secondary | ICD-10-CM | POA: Insufficient documentation

## 2021-01-26 DIAGNOSIS — R634 Abnormal weight loss: Secondary | ICD-10-CM | POA: Insufficient documentation

## 2021-01-26 DIAGNOSIS — E1165 Type 2 diabetes mellitus with hyperglycemia: Secondary | ICD-10-CM

## 2021-01-26 DIAGNOSIS — K5730 Diverticulosis of large intestine without perforation or abscess without bleeding: Secondary | ICD-10-CM | POA: Insufficient documentation

## 2021-01-26 DIAGNOSIS — Z888 Allergy status to other drugs, medicaments and biological substances status: Secondary | ICD-10-CM | POA: Insufficient documentation

## 2021-01-26 DIAGNOSIS — F319 Bipolar disorder, unspecified: Secondary | ICD-10-CM | POA: Diagnosis not present

## 2021-01-26 DIAGNOSIS — Z7901 Long term (current) use of anticoagulants: Secondary | ICD-10-CM | POA: Insufficient documentation

## 2021-01-26 DIAGNOSIS — Z79890 Hormone replacement therapy (postmenopausal): Secondary | ICD-10-CM | POA: Insufficient documentation

## 2021-01-26 DIAGNOSIS — Z6834 Body mass index (BMI) 34.0-34.9, adult: Secondary | ICD-10-CM | POA: Diagnosis not present

## 2021-01-26 DIAGNOSIS — K2970 Gastritis, unspecified, without bleeding: Secondary | ICD-10-CM | POA: Insufficient documentation

## 2021-01-26 DIAGNOSIS — Z794 Long term (current) use of insulin: Secondary | ICD-10-CM | POA: Insufficient documentation

## 2021-01-26 DIAGNOSIS — R1030 Lower abdominal pain, unspecified: Secondary | ICD-10-CM | POA: Diagnosis not present

## 2021-01-26 DIAGNOSIS — R1013 Epigastric pain: Secondary | ICD-10-CM | POA: Insufficient documentation

## 2021-01-26 DIAGNOSIS — Z87891 Personal history of nicotine dependence: Secondary | ICD-10-CM | POA: Diagnosis not present

## 2021-01-26 DIAGNOSIS — K635 Polyp of colon: Secondary | ICD-10-CM | POA: Diagnosis not present

## 2021-01-26 DIAGNOSIS — K3189 Other diseases of stomach and duodenum: Secondary | ICD-10-CM | POA: Diagnosis not present

## 2021-01-26 DIAGNOSIS — Z79899 Other long term (current) drug therapy: Secondary | ICD-10-CM | POA: Diagnosis not present

## 2021-01-26 DIAGNOSIS — K648 Other hemorrhoids: Secondary | ICD-10-CM | POA: Insufficient documentation

## 2021-01-26 DIAGNOSIS — D122 Benign neoplasm of ascending colon: Secondary | ICD-10-CM | POA: Insufficient documentation

## 2021-01-26 HISTORY — PX: COLONOSCOPY WITH PROPOFOL: SHX5780

## 2021-01-26 HISTORY — PX: POLYPECTOMY: SHX5525

## 2021-01-26 HISTORY — PX: ESOPHAGOGASTRODUODENOSCOPY (EGD) WITH PROPOFOL: SHX5813

## 2021-01-26 HISTORY — PX: BIOPSY: SHX5522

## 2021-01-26 LAB — GLUCOSE, CAPILLARY
Glucose-Capillary: 129 mg/dL — ABNORMAL HIGH (ref 70–99)
Glucose-Capillary: 97 mg/dL (ref 70–99)

## 2021-01-26 SURGERY — COLONOSCOPY WITH PROPOFOL
Anesthesia: General

## 2021-01-26 MED ORDER — CHLORHEXIDINE GLUCONATE CLOTH 2 % EX PADS: 6.0000 | MEDICATED_PAD | Freq: Once | CUTANEOUS | Status: DC

## 2021-01-26 MED ORDER — OMEPRAZOLE 20 MG PO CPDR: 20.0000 mg | DELAYED_RELEASE_CAPSULE | Freq: Two times a day (BID) | ORAL | 5 refills | Status: DC

## 2021-01-26 MED ORDER — LACTATED RINGERS IV SOLN: INTRAVENOUS | Status: DC

## 2021-01-26 MED ORDER — PROPOFOL 10 MG/ML IV BOLUS
INTRAVENOUS | Status: DC | PRN
  Administered 2021-01-26: 100 mg via INTRAVENOUS
  Administered 2021-01-26 (×2): 50 mg via INTRAVENOUS

## 2021-01-26 MED ORDER — PROPOFOL 500 MG/50ML IV EMUL
INTRAVENOUS | Status: DC | PRN
  Administered 2021-01-26: 125 ug/kg/min via INTRAVENOUS

## 2021-01-26 MED ORDER — LIDOCAINE HCL (CARDIAC) PF 100 MG/5ML IV SOSY
PREFILLED_SYRINGE | INTRAVENOUS | Status: DC | PRN
  Administered 2021-01-26: 50 mg via INTRAVENOUS

## 2021-01-26 NOTE — Anesthesia Postprocedure Evaluation (Signed)
Anesthesia Post Note  Patient: Teanna Elem  Procedure(s) Performed: COLONOSCOPY WITH PROPOFOL (N/A ) ESOPHAGOGASTRODUODENOSCOPY (EGD) WITH PROPOFOL (N/A ) BIOPSY POLYPECTOMY  Patient location during evaluation: Phase II Anesthesia Type: General Level of consciousness: awake Pain management: pain level controlled Vital Signs Assessment: post-procedure vital signs reviewed and stable Respiratory status: spontaneous breathing and respiratory function stable Cardiovascular status: blood pressure returned to baseline and stable Postop Assessment: no headache and no apparent nausea or vomiting Anesthetic complications: no Comments: Late entry   No complications documented.   Last Vitals:  Vitals:   01/26/21 0857 01/26/21 1037  BP: 137/61 (!) 142/79  Pulse: 82 66  Resp: 18 19  Temp: 36.6 C 36.6 C  SpO2: 98% 98%    Last Pain:  Vitals:   01/26/21 1037  TempSrc: Axillary  PainSc: 0-No pain                 Louann Sjogren

## 2021-01-26 NOTE — Op Note (Signed)
[facility] Patient Name: Laura [name] Procedure Date: 01/26/2021 10:16 AM MRN: [id number] Date of Birth: [birth year] Attending MD: Elon Alas. Abbey Chatters DO CSN: [id number] Age: 60 Admit Type: Outpatient Procedure:                Colonoscopy Indications:              Lower abdominal pain, Change in bowel habits Providers:                Elon Alas. Alizah Sills, DO, Otis Peak B. Sharon Seller, RN,                            Nelma Rothman, Technician Referring MD:              Medicines:                See the Anesthesia note for documentation of the                            administered medications Complications:            No immediate complications. Estimated Blood Loss:     Estimated blood loss was minimal. Procedure:                Pre-Anesthesia Assessment:                           - The anesthesia plan was to use monitored                            anesthesia care (MAC).                           After obtaining informed consent, the colonoscope                            was passed under direct vision. Throughout the                            procedure, the patient's blood pressure, pulse, and                            oxygen saturations were monitored continuously. The                            [id number]-HQ190L ([id number]) scope was introduced through                            the anus and advanced to the the cecum, identified                            by appendiceal orifice and ileocecal valve. The                            colonoscopy was performed without difficulty. The                            patient tolerated  the procedure well. The quality                            of the bowel preparation was evaluated using the                            BBPS [facility] Bowel Preparation Scale) with scores                            of: Right Colon = 2 (minor amount of residual                            staining, small fragments of stool and/or opaque                            liquid, but  mucosa seen well), Transverse Colon = 2                            (minor amount of residual staining, small fragments                            of stool and/or opaque liquid, but mucosa seen                            well) and Left Colon = 2 (minor amount of residual                            staining, small fragments of stool and/or opaque                            liquid, but mucosa seen well). The total BBPS score                            equals 6. The quality of the bowel preparation was                            fair. Scope In: 10:17:54 AM Scope Out: 10:31:46 AM Scope Withdrawal Time: 0 hours 10 minutes 24 seconds  Total Procedure Duration: 0 hours 13 minutes 52 seconds  Findings:      The perianal and digital rectal examinations were normal.      Non-bleeding internal hemorrhoids were found during endoscopy.      Multiple small-mouthed diverticula were found in the sigmoid colon and       descending colon.      Two sessile polyps were found in the ascending colon. The polyps were 5       to 8 mm in size. These polyps were removed with a cold snare. Resection       and retrieval were complete. Impression:               - Preparation of the colon was fair.                           - Non-bleeding internal hemorrhoids.                           -  Diverticulosis in the sigmoid colon and in the                            descending colon.                           - Two 5 to 8 mm polyps in the ascending colon,                            removed with a cold snare. Resected and retrieved. Moderate Sedation:      Per Anesthesia Care Recommendation:           - Patient has a contact number available for                            emergencies. The signs and symptoms of potential                            delayed complications were discussed with the                            patient. Return to normal activities tomorrow.                            Written discharge instructions  were provided to the                            patient.                           - Resume previous diet.                           - Continue present medications.                           - Await pathology results.                           - Repeat colonoscopy in 5 years for surveillance.                           - Return to GI clinic in 3 months. Procedure Code(s):        --- Professional ---                           [phone], Colonoscopy, flexible; with removal of                            tumor(s), polyp(s), or other lesion(s) by snare                            technique Diagnosis Code(s):        --- Professional ---  K64.8, Other hemorrhoids                           K63.5, Polyp of colon                           R10.30, Lower abdominal pain, unspecified                           R19.4, Change in bowel habit                           K57.30, Diverticulosis of large intestine without                            perforation or abscess without bleeding CPT copyright 2019 American Medical Association. All rights reserved. The codes documented in this report are preliminary and upon coder review may  be revised to meet current compliance requirements. Elon Alas. Abbey Chatters, DO [place] Abbey Chatters, DO 01/26/2021 10:40:07 AM This report has been signed electronically. Number of Addenda: 0

## 2021-01-26 NOTE — H&P (Signed)
Primary Care Physician:  [name], MD Primary Gastroenterologist:  Dr. Abbey Chatters  Pre-Procedure History & Physical: HPI:  Laura [name] is a 60 y.o. female is here for an EGD colonoscopy for nausea, abdominal pain, change in bowel habits    Patient complains of pain all across the lower abdomen, sharp pains in the right lower quadrant.  Pain is crampy in nature, like a period.  Occurring for months.  She has had some nausea with vomiting.  Taking Zofran.  Bowels fluctuate between diarrhea and constipation, predominance to diarrhea. No fecal incontinence or nocturnal stools.  Feels like she has difficulty digesting tough foods, pork.  No heartburn.  No melena or rectal bleeding.  Patient also complains of urinary incontinence, worse over the past year. Can't get out of bed and get to the bathroom without having significant incontinence.  States she has a cyst on her kidney and it needs to be followed up on.  Has had 3 C-sections and has a retroverted uterus which she feels like is contributing to her symptoms.  Has a lot of back pain so not sure if her pain is associated to referred pain. When she first hurt her back she had excruciating pain in the RLQ but now pain is better. Has been using zofran regularly for nausea.   Past Medical History:  Diagnosis Date  . Asthma   . Atrial fibrillation ([place])   . Bipolar disorder ([place])   . Bronchiectasis ([place])   . Carpal tunnel syndrome of right wrist   . CHF (congestive heart failure) ([place])   . Complication of anesthesia    "my Dr in [place] told me I had an allergic reaction to the combination of the pain meds and the anesthesia" she does not remember what happened but "I eneded up in the ICU for 5 days".  . H/O congenital atrial septal defect (ASD) repair   . Hypogammaglobulinemia ([place])   . Hypothyroid   . 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 Neuropathy   . Neuropathy   . Osteoarthritis   . Pinched nerve in neck   . PTSD (post-traumatic stress disorder)    . Recurrent sinusitis   . Short-term memory loss   . Sleep apnea   . TIA (transient ischemic attack)   . Tremors of nervous system    seeing PCP for this.  . Type 2 diabetes mellitus ([place])   . Urticaria   . Wound infection     Past Surgical History:  Procedure Laterality Date  . 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 CARDIAC SURGERY    . CARDIOVERSION    . CARDIOVERSION N/A 08/29/2018   Procedure: CARDIOVERSION;  Surgeon: Arnoldo Lenis, MD;  Location[facility];  Service: Endoscopy;  Laterality: N/A;  . [place]  . LEFT HEART CATH AND CORONARY ANGIOGRAPHY N/A 08/30/2019   Procedure: LEFT HEART CATH AND CORONARY ANGIOGRAPHY;  Surgeon: [name], MD;  Location: [facility];  Service: Cardiovascular;  Laterality: N/A;  . NASAL SINUS SURGERY  2017    Prior to Admission medications   Medication Sig Start Date End Date Taking? Authorizing Provider  acetaminophen (TYLENOL) 325 MG tablet Take 2 tablets (650 mg total) by mouth every 6 (six) hours as needed for mild pain, fever or headache. 08/11/18  Yes Emokpae, Courage, MD  albuterol (PROVENTIL) (2.5 MG/3ML) 0.083% nebulizer solution Take 3 mLs (2.5 mg total) by nebulization every 4 (four) hours as needed for wheezing or  shortness of breath. 10/27/20  Yes Valentina Shaggy, MD  albuterol (VENTOLIN HFA) 108 (90 Base) MCG/ACT inhaler INHALE 2 PUFFS INTO THE LUNGS EVERY 6 HOURS AS NEEDED FOR WHEEZING OR SHORTNESS OF BREATH Patient taking differently: Inhale 2 puffs into the lungs every 6 (six) hours as needed for wheezing or shortness of breath. 04/15/20  Yes Valentina Shaggy, MD  apixaban (ELIQUIS) 5 MG TABS tablet Take 5 mg by mouth 2 (two) times daily.   Yes [provider]  atorvastatin (LIPITOR) 10 MG tablet Take 10 mg by mouth every evening.    Yes [provider]  Budeson-Glycopyrrol-Formoterol (BREZTRI AEROSPHERE) 160-9-4.8 MCG/ACT AERO Inhale 2 puffs into the lungs 2 (two) times  daily.   Yes [provider]  Cholecalciferol (VITAMIN D3) 125 MCG (5000 UT) CAPS Take 1 capsule (5,000 Units total) by mouth daily. 11/25/20  Yes Nida, Marella Chimes, MD  diltiazem (CARDIZEM CD) 360 MG 24 hr capsule Take 1 capsule (360 mg total) by mouth daily. 05/29/20  Yes BranchAlphonse Guild, MD  divalproex (DEPAKOTE ER) 500 MG 24 hr tablet 500 mg in AM, 1000 mg at night Patient taking differently: Take 500-1,000 mg by mouth See admin instructions. Take 500 mg in AM, 1000 mg at night 11/06/20  Yes Hisada, Reina, MD  Dulaglutide (TRULICITY) 4.56 YB/6.3SL SOPN Inject 0.75 mg into the skin once a week. Patient taking differently: Inject 0.75 mg into the skin every Sunday. 12/10/20  Yes Nida, Marella Chimes, MD  ELDERBERRY PO Take 4 g by mouth daily. Gummies 2 g each   Yes [provider]  EPINEPHrine 0.3 mg/0.3 mL IJ SOAJ injection Inject 0.3 mg into the muscle as needed for anaphylaxis. 01/17/20  Yes [provider]  FASENRA PEN 30 MG/ML SOAJ INJECT $RemoveBef'30MG'lpErnPtBtK$  SUBCUTANEOUSLY  EVERY 8 WEEKS (GIVEN AT MD  OFFICE) Patient taking differently: Inject 30 mg into the skin See admin instructions. Every eight weeks 12/21/20  Yes Valentina Shaggy, MD  ferrous sulfate 324 MG TBEC Take 324 mg by mouth daily with breakfast.   Yes [provider]  gabapentin (NEURONTIN) 300 MG capsule Take 300 mg by mouth 3 (three) times daily.    Yes [provider]  HUMALOG KWIKPEN 100 UNIT/ML KwikPen Inject 5 Units into the skin 3 (three) times daily before meals. Sliding scale. 08/28/18  Yes [provider]  hydrALAZINE (APRESOLINE) 25 MG tablet Take 25 mg by mouth 3 (three) times daily.   Yes [provider]  hydrOXYzine (ATARAX/VISTARIL) 25 MG tablet Take 1 tablet (25 mg total) by mouth every 6 (six) hours as needed for anxiety or nausea. 08/11/18  Yes Emokpae, Courage, MD  Immune Globulin, Human, 4 GM/20ML SOLN Inject 100 mLs into the skin every 14 (fourteen) days.    Yes [provider]  LANTUS SOLOSTAR 100 UNIT/ML Solostar Pen Inject 45 Units into the skin at bedtime. 08/15/19  Yes [provider]  levothyroxine (SYNTHROID, LEVOTHROID) 112 MCG tablet Take 112 mcg by mouth daily before breakfast.   Yes [provider]  Magnesium Oxide 400 MG CAPS Take 1 capsule (400 mg total) by mouth daily. 11/04/19  Yes BranchAlphonse Guild, MD  Melatonin 3 MG TABS Take 3 mg by mouth at bedtime.   Yes [provider]  montelukast (SINGULAIR) 10 MG tablet Take 1 tablet by mouth daily. 11/23/20  Yes [provider]  Multiple Vitamins-Minerals (MULTIVITAMIN WITH MINERALS) tablet Take 1 tablet by mouth daily. Woman   Yes [provider]  Multiple Vitamins-Minerals (MULTIVITAMIN WITH MINERALS) tablet Take 1 tablet by mouth daily. Vita Fusion sugar free 4 gram fiber   Yes [provider]  Multiple Vitamins-Minerals (ZINC PO) Take by mouth.   Yes [provider]  omeprazole (PRILOSEC) 20 MG capsule TAKE 1 CAPSULE(20 MG) BY MOUTH DAILY Patient taking differently: Take 20 mg by mouth daily as needed (reflux). 07/03/20  Yes Althea Charon, FNP  ondansetron (ZOFRAN) 4 MG tablet Take 1 tablet (4 mg total) by mouth every 8 (eight) hours as needed for nausea or vomiting. 07/13/19  Yes Martin, Mary-Margaret, FNP  Potassium Chloride ER 20 MEQ TBCR Take 1 tablet by mouth daily. 01/04/21  Yes [provider]  topiramate (TOPAMAX) 100 MG tablet Take 1 tablet (100 mg total) by mouth 2 (two) times daily. 12/04/20  Yes Tat, Eustace Quail, DO  torsemide (DEMADEX) 20 MG tablet Take 2 tablets (40 mg total) by mouth daily. Patient taking differently: Take 20 mg by mouth daily. 11/03/20 02/01/21 Yes Strader, Fransisco Hertz, PA-C  vitamin B-12 (CYANOCOBALAMIN) 1000 MCG tablet Take 1,000 mcg by mouth daily.   Yes [provider]  vitamin C (ASCORBIC ACID) 500 MG tablet Take 500 mg by mouth daily. Power C immune support   Yes  [provider]  BD PEN NEEDLE NANO 2ND GEN 32G X 4 MM MISC SMARTSIG:Injection 4 Times Daily 05/20/20   [provider]  blood glucose meter kit and supplies 1 each by Other route 4 (four) times daily. Dispense based on patient and insurance preference. Use four times daily as directed. (FOR ICD-10 E10.9, E11.9). 01/25/21   Cassandria Anger, MD  Continuous Blood Gluc Receiver (FREESTYLE LIBRE 2 READER) DEVI As directed 12/10/20   Cassandria Anger, MD  Continuous Blood Gluc Sensor (FREESTYLE LIBRE 2 SENSOR) MISC 1 Piece by Does not apply route every 14 (fourteen) days. 12/10/20   Cassandria Anger, MD  Nebulizer MISC Nebulizer tubing kit 10/21/20   Valentina Shaggy, MD  [facility] ULTRA test strip USE TO CHECK BLOOD SUGAR FOUR TIMES DAILY 10/17/19   [provider]  Tiotropium Bromide Monohydrate (SPIRIVA RESPIMAT) 1.25 MCG/ACT AERS Inhale 2 puffs into the lungs daily. Patient not taking: Reported on 01/19/2021 02/21/20   Valentina Shaggy, MD    Allergies as of 12/24/2020 - Review Complete 12/24/2020  Allergen Reaction Noted  . Ativan [lorazepam] Hives 02/06/2018  . Phenergan [promethazine hcl] Hives 02/06/2018    Family History  Problem Relation Age of Onset  . Hypertension Mother   . Stroke Mother   . Kidney disease Mother   . Heart attack Mother   . Alcohol abuse Mother   . Other Father        ?colon cancer  . Kidney disease Sister   . Heart disease Brother   . Stroke Maternal Aunt   . Heart disease Maternal Aunt   . Hypertension Sister   . Bipolar disorder Sister   . Thyroid disease Sister   . Prostate cancer Brother   . Thyroid disease Daughter   . Hashimoto's thyroiditis Daughter   . Celiac disease Daughter   . Allergic rhinitis Daughter   . Asthma Neg Hx     Social History   Socioeconomic History  . Marital status: Widowed    Spouse name: Not on file  . Number of children: Not on file  . Years of education: Not on file  .  Highest education level: Not on file  Occupational History  .  Not on file  Tobacco Use  . Smoking status: Former Smoker    Packs/day: 1.00    Years: 28.00    Pack years: 28.00    Types: Cigarettes    Start date: 89    Quit date: 2008    Years since quitting: 14.3  . Smokeless tobacco: Never Used  Vaping Use  . Vaping Use: Never used  Substance and Sexual Activity  . Alcohol use: Not Currently  . Drug use: Not Currently  . Sexual activity: Not Currently    Birth control/protection: Post-menopausal  Other Topics Concern  . Not on file  Social History Narrative  . Not on file   Social Determinants of Health   Financial Resource Strain: Medium Risk  . Difficulty of Paying Living Expenses: Somewhat hard  Food Insecurity: Food Insecurity Present  . Worried About Charity fundraiser in the Last Year: Often true  . Ran Out of Food in the Last Year: Often true  Transportation Needs: No Transportation Needs  . Lack of Transportation (Medical): No  . Lack of Transportation (Non-Medical): No  Physical Activity: Insufficiently Active  . Days of Exercise per Week: 3 days  . Minutes of Exercise per Session: 20 min  Stress: Stress Concern Present  . Feeling of Stress : To some extent  Social Connections: Moderately Isolated  . Frequency of Communication with Friends and Family: More than three times a week  . Frequency of Social Gatherings with Friends and Family: More than three times a week  . Attends Religious Services: 1 to 4 times per year  . Active Member of Clubs or Organizations: No  . Attends Archivist Meetings: Never  . Marital Status: Widowed  Intimate Partner Violence: Not At Risk  . Fear of Current or Ex-Partner: No  . Emotionally Abused: No  . Physically Abused: No  . Sexually Abused: No    Review of Systems: See HPI, otherwise negative ROS  Physical Exam: Vital signs in last 24 hours: Temp:  [97.9 F (36.6 C)] 97.9 F (36.6 C) (04/19  0857) Pulse Rate:  [82] 82 (04/19 0857) Resp:  [18] 18 (04/19 0857) BP: (137)/(61) 137/61 (04/19 0857) SpO2:  [98 %] 98 % (04/19 0857)   General:   Alert,  Well-developed, well-nourished, pleasant and cooperative in NAD Head:  Normocephalic and atraumatic. Eyes:  Sclera clear, no icterus.   Conjunctiva pink. Ears:  Normal auditory acuity. Nose:  No deformity, discharge,  or lesions. Mouth:  No deformity or lesions, dentition normal. Neck:  Supple; no masses or thyromegaly. Lungs:  Clear throughout to auscultation.   No wheezes, crackles, or rhonchi. No acute distress. Heart:  Regular rate and rhythm; no murmurs, clicks, rubs,  or gallops. Abdomen:  Soft, nontender and nondistended. No masses, hepatosplenomegaly or hernias noted. Normal bowel sounds, without guarding, and without rebound.   Msk:  Symmetrical without gross deformities. Normal posture. Extremities:  Without clubbing or edema. Neurologic:  Alert and  oriented x4;  grossly normal neurologically. Skin:  Intact without significant lesions or rashes. Cervical Nodes:  No significant cervical adenopathy. Psych:  Alert and cooperative. Normal mood and affect.  Impression/Plan: Laura [name] is here for an EGD colonoscopy for nausea, abdominal pain, change in bowel habits  The risks of the procedure including infection, bleed, or perforation as well as benefits, limitations, alternatives and imponderables have been reviewed with the patient. Questions have been answered. All parties agreeable.

## 2021-01-26 NOTE — Discharge Instructions (Addendum)
EGD Discharge instructions Please read the instructions outlined below and refer to this sheet in the next few weeks. These discharge instructions provide you with general information on caring for yourself after you leave the hospital. Your doctor may also give you specific instructions. While your treatment has been planned according to the most current medical practices available, unavoidable complications occasionally occur. If you have any problems or questions after discharge, please call your doctor. ACTIVITY  You may resume your regular activity but move at a slower pace for the next 24 hours.   Take frequent rest periods for the next 24 hours.   Walking will help expel (get rid of) the air and reduce the bloated feeling in your abdomen.   No driving for 24 hours (because of the anesthesia (medicine) used during the test).   You may shower.   Do not sign any important legal documents or operate any machinery for 24 hours (because of the anesthesia used during the test).  NUTRITION  Drink plenty of fluids.   You may resume your normal diet.   Begin with a light meal and progress to your normal diet.   Avoid alcoholic beverages for 24 hours or as instructed by your caregiver.  MEDICATIONS  You may resume your normal medications unless your caregiver tells you otherwise.  WHAT YOU CAN EXPECT TODAY  You may experience abdominal discomfort such as a feeling of fullness or "gas" pains.  FOLLOW-UP  Your doctor will discuss the results of your test with you.  SEEK IMMEDIATE MEDICAL ATTENTION IF ANY OF THE FOLLOWING OCCUR:  Excessive nausea (feeling sick to your stomach) and/or vomiting.   Severe abdominal pain and distention (swelling).   Trouble swallowing.   Temperature over 101 F (37.8 C).   Rectal bleeding or vomiting of blood.    Colonoscopy Discharge Instructions  Read the instructions outlined below and refer to this sheet in the next few weeks. These  discharge instructions provide you with general information on caring for yourself after you leave the hospital. Your doctor may also give you specific instructions. While your treatment has been planned according to the most current medical practices available, unavoidable complications occasionally occur.   ACTIVITY  You may resume your regular activity, but move at a slower pace for the next 24 hours.   Take frequent rest periods for the next 24 hours.   Walking will help get rid of the air and reduce the bloated feeling in your belly (abdomen).   No driving for 24 hours (because of the medicine (anesthesia) used during the test).    Do not sign any important legal documents or operate any machinery for 24 hours (because of the anesthesia used during the test).  NUTRITION  Drink plenty of fluids.   You may resume your normal diet as instructed by your doctor.   Begin with a light meal and progress to your normal diet. Heavy or fried foods are harder to digest and may make you feel sick to your stomach (nauseated).   Avoid alcoholic beverages for 24 hours or as instructed.  MEDICATIONS  You may resume your normal medications unless your doctor tells you otherwise.  WHAT YOU CAN EXPECT TODAY  Some feelings of bloating in the abdomen.   Passage of more gas than usual.   Spotting of blood in your stool or on the toilet paper.  IF YOU HAD POLYPS REMOVED DURING THE COLONOSCOPY:  No aspirin products for 7 days or as instructed.  No alcohol for 7 days or as instructed.   Eat a soft diet for the next 24 hours.  FINDING OUT THE RESULTS OF YOUR TEST Not all test results are available during your visit. If your test results are not back during the visit, make an appointment with your caregiver to find out the results. Do not assume everything is normal if you have not heard from your caregiver or the medical facility. It is important for you to follow up on all of your test results.   SEEK IMMEDIATE MEDICAL ATTENTION IF:  You have more than a spotting of blood in your stool.   Your belly is swollen (abdominal distention).   You are nauseated or vomiting.   You have a temperature over 101.   You have abdominal pain or discomfort that is severe or gets worse throughout the day.   Your EGD revealed a mild amount inflammation in her stomach.  I took biopsies of this to rule outl infection with bacteria called H. pylori.  I want you to increase your omeprazole to 20 mg twice daily for the next 8 weeks.  Avoid NSAIDs.  I sent a new prescription to your pharmacy.  Your colonoscopy revealed 2 polyp(s) which I removed successfully. Await pathology results, my office will contact you. I recommend repeating colonoscopy in 5 years for surveillance purposes. You also have diverticulosis and internal hemorrhoids. I would recommend increasing fiber in your diet or adding OTC Benefiber/Metamucil. Be sure to drink at least 4 to 6 glasses of water daily. Follow-up with GI in 2-3 months.    I hope you have a great rest of your week!  Elon Alas. Abbey Chatters, D.O. Gastroenterology and Hepatology [facility]

## 2021-01-26 NOTE — Telephone Encounter (Signed)
Noted  

## 2021-01-26 NOTE — Op Note (Signed)
[facility] Patient Name: Laura [name] Procedure Date: 01/26/2021 9:59 AM MRN: [id number] Date of Birth: 06/17/1961 Attending MD: Elon Alas. Abbey Chatters DO CSN: [id number] Age: 60 Admit Type: Outpatient Procedure:                Upper GI endoscopy Indications:              Epigastric abdominal pain, Nausea, Weight loss Providers:                Elon Alas. Hooria Gasparini, DO, [place] B. Sharon Seller, RN,                            Nelma Rothman, Technician Referring MD:              Medicines:                See the Anesthesia note for documentation of the                            administered medications Complications:            No immediate complications. Estimated Blood Loss:     Estimated blood loss was minimal. Procedure:                Pre-Anesthesia Assessment:                           - The anesthesia plan was to use monitored                            anesthesia care (MAC).                           After obtaining informed consent, the endoscope was                            passed under direct vision. Throughout the                            procedure, the patient's blood pressure, pulse, and                            oxygen saturations were monitored continuously. The                            [id number]-H190 ([id number]) scope was introduced through the                            mouth, and advanced to the second part of duodenum.                            The upper GI endoscopy was accomplished without                            difficulty. The patient tolerated the procedure  well. Scope In: 10:08:40 AM Scope Out: 10:12:53 AM Total Procedure Duration: 0 hours 4 minutes 13 seconds  Findings:      There is no endoscopic evidence of bleeding, areas of erosion,       esophagitis, ulcerations or varices in the entire esophagus.      Moderate inflammation characterized by erosions and erythema was found       on the greater curvature of the stomach and in the  gastric antrum.       Biopsies were taken with a cold forceps for Helicobacter pylori testing.      The duodenal bulb, first portion of the duodenum and second portion of       the duodenum were normal. Impression:               - Gastritis. Biopsied.                           - Normal duodenal bulb, first portion of the                            duodenum and second portion of the duodenum. Moderate Sedation:      Per Anesthesia Care Recommendation:           - Patient has a contact number available for                            emergencies. The signs and symptoms of potential                            delayed complications were discussed with the                            patient. Return to normal activities tomorrow.                            Written discharge instructions were provided to the                            patient.                           - Resume previous diet.                           - Continue present medications.                           - Await pathology results.                           - Use Prilosec (omeprazole) 20 mg PO BID for 8                            weeks.                           - Return to GI clinic in 3 months. Procedure Code(s):        ---  Professional ---                           [phone], Esophagogastroduodenoscopy, flexible,                            transoral; with biopsy, single or multiple Diagnosis Code(s):        --- Professional ---                           K29.70, Gastritis, unspecified, without bleeding                           R10.13, Epigastric pain                           R11.0, Nausea                           R63.4, Abnormal weight loss CPT copyright 2019 American Medical Association. All rights reserved. The codes documented in this report are preliminary and upon coder review may  be revised to meet current compliance requirements. Elon Alas. Abbey Chatters, DO [place] Abbey Chatters, DO 01/26/2021 10:16:09 AM This report has  been signed electronically. Number of Addenda: 0

## 2021-01-26 NOTE — Telephone Encounter (Signed)
Patient said that her PCP gave her a 2 week Elenor Legato system-she said she was suppose to notify us of this so that she could come in and have [place] attach it. I will call pt

## 2021-01-26 NOTE — Telephone Encounter (Signed)
Called pt-she said she will call back because she was at [facility] being admitted for a procedure.

## 2021-01-26 NOTE — Transfer of Care (Signed)
Immediate Anesthesia Transfer of Care Note  Patient: Laura [name]  Procedure(s) Performed: COLONOSCOPY WITH PROPOFOL (N/A ) ESOPHAGOGASTRODUODENOSCOPY (EGD) WITH PROPOFOL (N/A ) BIOPSY POLYPECTOMY  Patient Location: PACU  Anesthesia Type:General  Level of Consciousness: drowsy  Airway & Oxygen Therapy: Patient Spontanous Breathing  Post-op Assessment: Report given to RN and Post -op Vital signs reviewed and stable  Post vital signs: Reviewed and stable  Last Vitals:  Vitals Value Taken Time  BP    Temp    Pulse    Resp    SpO2      Last Pain:  Vitals:   01/26/21 1004  TempSrc:   PainSc: 3       Patients Stated Pain Goal: 5 (62/03/55 9741)  Complications: No complications documented.

## 2021-01-26 NOTE — Anesthesia Preprocedure Evaluation (Signed)
Anesthesia Evaluation  Patient identified by MRN, date of birth, ID band Patient awake    Reviewed: Allergy & Precautions, H&P , NPO status , Patient's Chart, lab work & pertinent test results, reviewed documented beta blocker date and time   Airway Mallampati: II  TM Distance: >3 FB Neck ROM: full    Dental no notable dental hx.    Pulmonary shortness of breath, asthma , sleep apnea , former smoker,    Pulmonary exam normal breath sounds clear to auscultation       Cardiovascular Exercise Tolerance: Good hypertension,  Rhythm:regular Rate:Normal     Neuro/Psych  Headaches, PSYCHIATRIC DISORDERS Anxiety Bipolar Disorder TIA Neuromuscular disease    GI/Hepatic negative GI ROS, Neg liver ROS,   Endo/Other  diabetes, Type 2Hypothyroidism Morbid obesity  Renal/GU negative Renal ROS  negative genitourinary   Musculoskeletal   Abdominal   Peds  Hematology negative hematology ROS (+)   Anesthesia Other Findings 1. Left ventricular ejection fraction, by estimation, is 65 to 70%. The left ventricle has normal function. The left ventricle has no regional wall motion abnormalities. There is mild left ventricular hypertrophy. Left ventricular diastolic parameters are indeterminate. Elevated left atrial pressure. 2. Right ventricular systolic function is low normal. The right ventricular size is normal. Tricuspid regurgitation signal is inadequate for assessing PA pressure. 3. Left atrial size was moderately dilated. 4. Reported to be status post ASD repair. Atrial septum is thickened without obvious shunt. 5. The mitral valve is abnormal. Trivial mitral valve regurgitation. Moderate to severe mitral annular calcification. 6. The aortic valve is tricuspid. There is mild calcification of the aortic valve. Aortic valve regurgitation is not visualized. 7. The inferior vena cava is normal in size with greater than 50% respiratory  variability, suggesting right atrial pressure of 3 mmHg.  Reproductive/Obstetrics negative OB ROS                             Anesthesia Physical Anesthesia Plan  ASA: III  Anesthesia Plan: General   Post-op Pain Management:    Induction:   PONV Risk Score and Plan: Propofol infusion  Airway Management Planned:   Additional Equipment:   Intra-op Plan:   Post-operative Plan:   Informed Consent: I have reviewed the patients History and Physical, chart, labs and discussed the procedure including the risks, benefits and alternatives for the proposed anesthesia with the patient or authorized representative who has indicated his/her understanding and acceptance.     Dental Advisory Given  Plan Discussed with: CRNA  Anesthesia Plan Comments:         Anesthesia Quick Evaluation

## 2021-01-27 ENCOUNTER — Ambulatory Visit (HOSPITAL_COMMUNITY): Payer: Medicare Other

## 2021-01-27 LAB — SURGICAL PATHOLOGY

## 2021-01-27 MED ORDER — BLOOD GLUCOSE METER KIT: 1.0000 | PACK | Freq: Four times a day (QID) | 0 refills | Status: AC

## 2021-01-27 NOTE — Telephone Encounter (Signed)
Rx sent 

## 2021-01-27 NOTE — Addendum Note (Signed)
Addended by: Ellin Saba on: 01/27/2021 04:04 PM   Modules accepted: Orders

## 2021-01-27 NOTE — Telephone Encounter (Signed)
Pt is calling and is wanting a one touch meter sent into her pharmacy, states she needs this as back up with the free style libre. She states her insurance is not covering it, she would like for this to be sent in.   [id number] - [place], [address] [place] Phone:  [phone]  Fax:  [phone]

## 2021-01-28 ENCOUNTER — Encounter (HOSPITAL_COMMUNITY): Payer: Self-pay | Admitting: Internal Medicine

## 2021-01-28 ENCOUNTER — Encounter (HOSPITAL_COMMUNITY): Payer: Medicare Other | Admitting: Physical Therapy

## 2021-01-29 ENCOUNTER — Ambulatory Visit (HOSPITAL_COMMUNITY)
Admission: RE | Admit: 2021-01-29 | Discharge: 2021-01-29 | Disposition: A | Discharge status: Home / Self Care | Payer: Medicare Other | Source: Ambulatory Visit | Attending: Adult Health | Admitting: Adult Health

## 2021-01-29 ENCOUNTER — Ambulatory Visit: Payer: Medicare Other

## 2021-01-29 ENCOUNTER — Other Ambulatory Visit: Payer: Self-pay

## 2021-01-29 ENCOUNTER — Telehealth (INDEPENDENT_AMBULATORY_CARE_PROVIDER_SITE_OTHER): Payer: Medicare Other | Admitting: Psychiatry

## 2021-01-29 ENCOUNTER — Encounter: Payer: Self-pay | Admitting: Psychiatry

## 2021-01-29 DIAGNOSIS — N949 Unspecified condition associated with female genital organs and menstrual cycle: Secondary | ICD-10-CM | POA: Diagnosis not present

## 2021-01-29 DIAGNOSIS — Z780 Asymptomatic menopausal state: Secondary | ICD-10-CM | POA: Diagnosis not present

## 2021-01-29 DIAGNOSIS — G8929 Other chronic pain: Secondary | ICD-10-CM | POA: Insufficient documentation

## 2021-01-29 DIAGNOSIS — Z1231 Encounter for screening mammogram for malignant neoplasm of breast: Secondary | ICD-10-CM | POA: Insufficient documentation

## 2021-01-29 DIAGNOSIS — F3341 Major depressive disorder, recurrent, in partial remission: Secondary | ICD-10-CM

## 2021-01-29 DIAGNOSIS — N83291 Other ovarian cyst, right side: Secondary | ICD-10-CM | POA: Diagnosis not present

## 2021-01-29 DIAGNOSIS — N888 Other specified noninflammatory disorders of cervix uteri: Secondary | ICD-10-CM | POA: Diagnosis not present

## 2021-01-29 DIAGNOSIS — R1031 Right lower quadrant pain: Secondary | ICD-10-CM | POA: Diagnosis not present

## 2021-01-29 IMAGING — MG MM DIGITAL SCREENING BILAT W/ TOMO AND CAD
8 series · 8 of 24 positions shown · non-contrast
Comparison: Previous exam(s).

CLINICAL DATA: Screening.

EXAM:
DIGITAL SCREENING BILATERAL MAMMOGRAM WITH TOMOSYNTHESIS AND CAD
TECHNIQUE: Bilateral screening digital craniocaudal and mediolateral oblique
mammograms were obtained. Bilateral screening digital breast
tomosynthesis was performed. The images were evaluated with
computer-aided detection.

[R CC synth-2D]
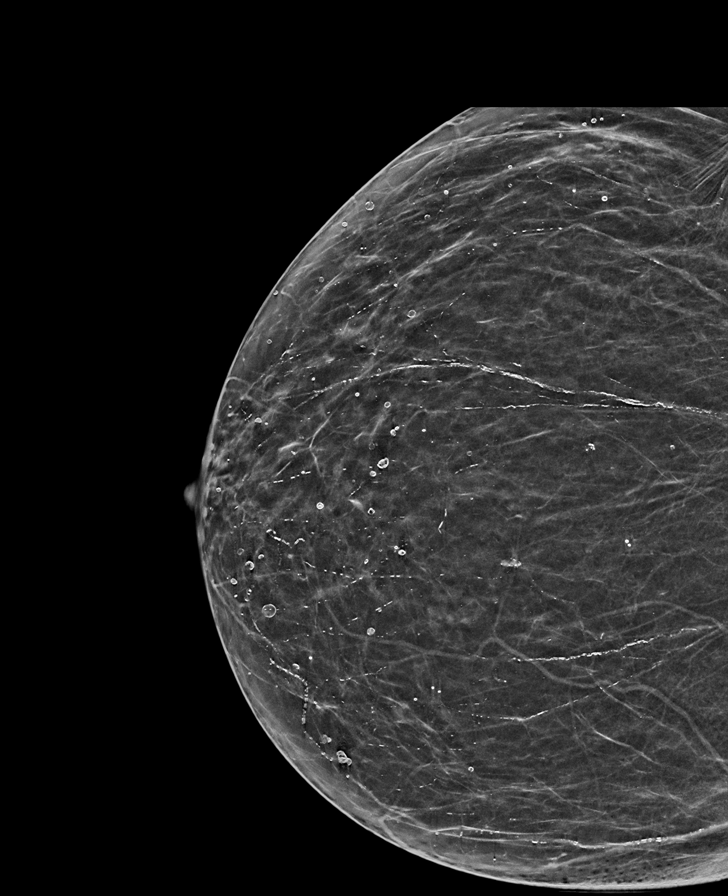

[R MLO synth-2D]
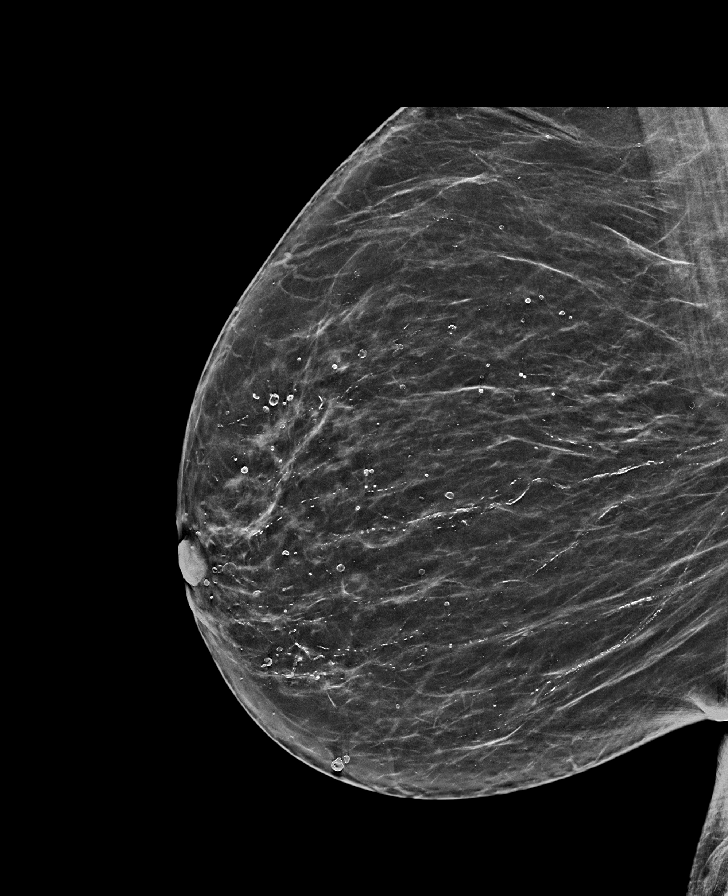

[L CC synth-2D]
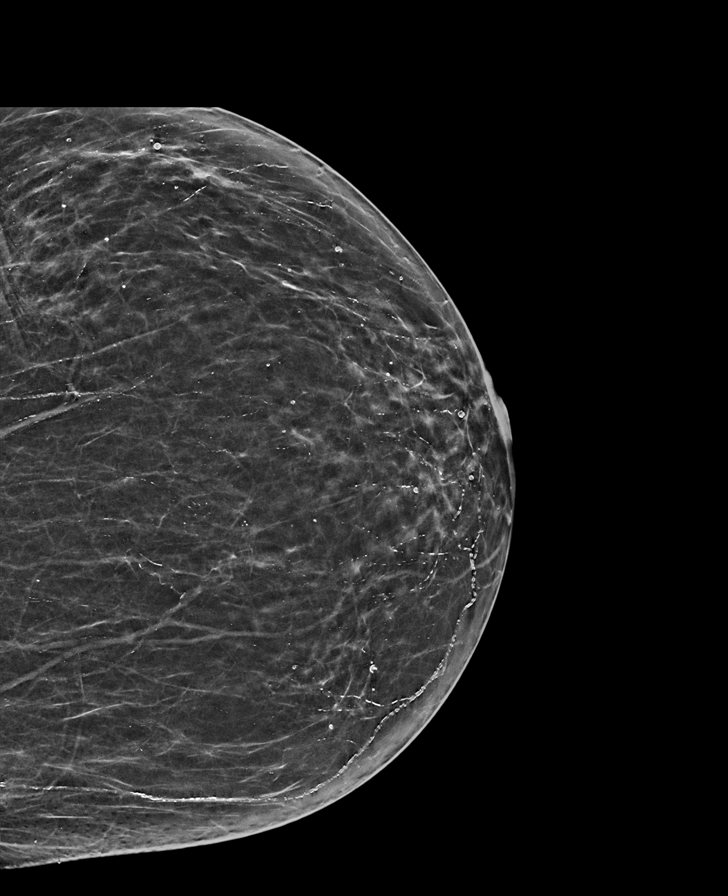

[L MLO synth-2D]
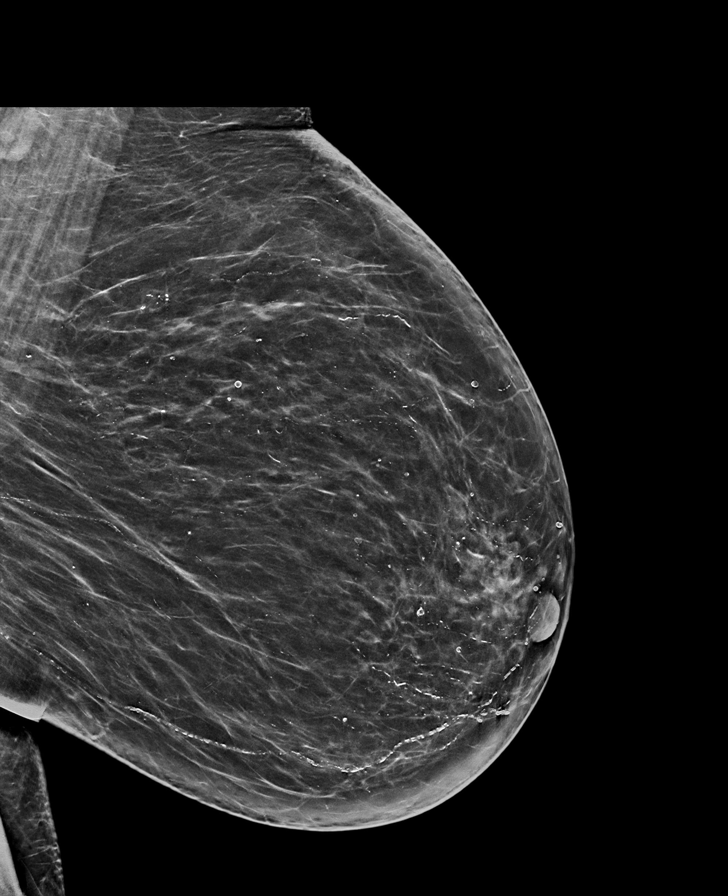

[R MLO tomo · tomo slice 37/72.0]
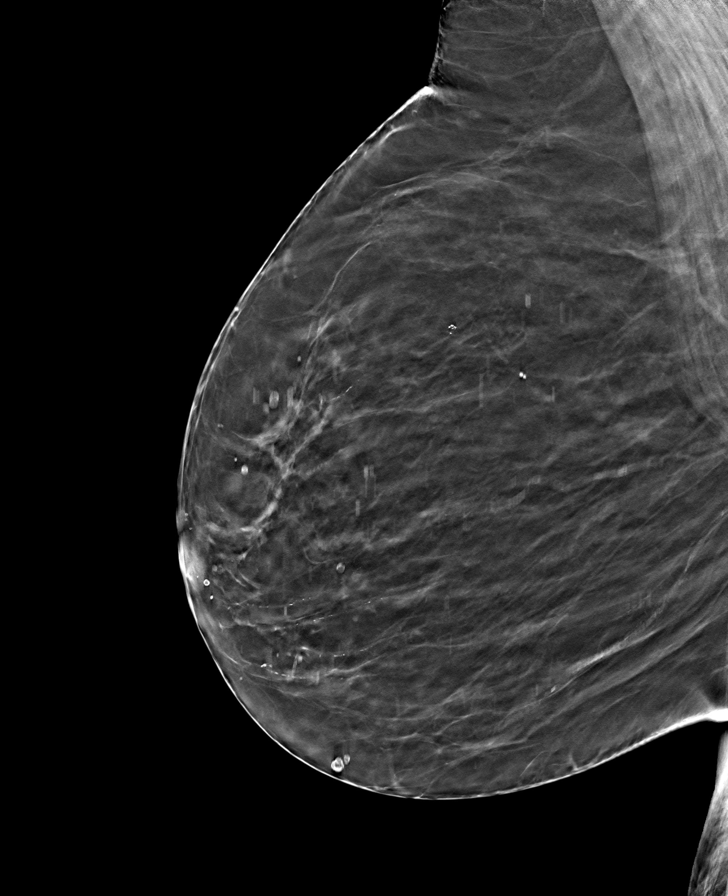

[L CC tomo · tomo slice 31/61.0]
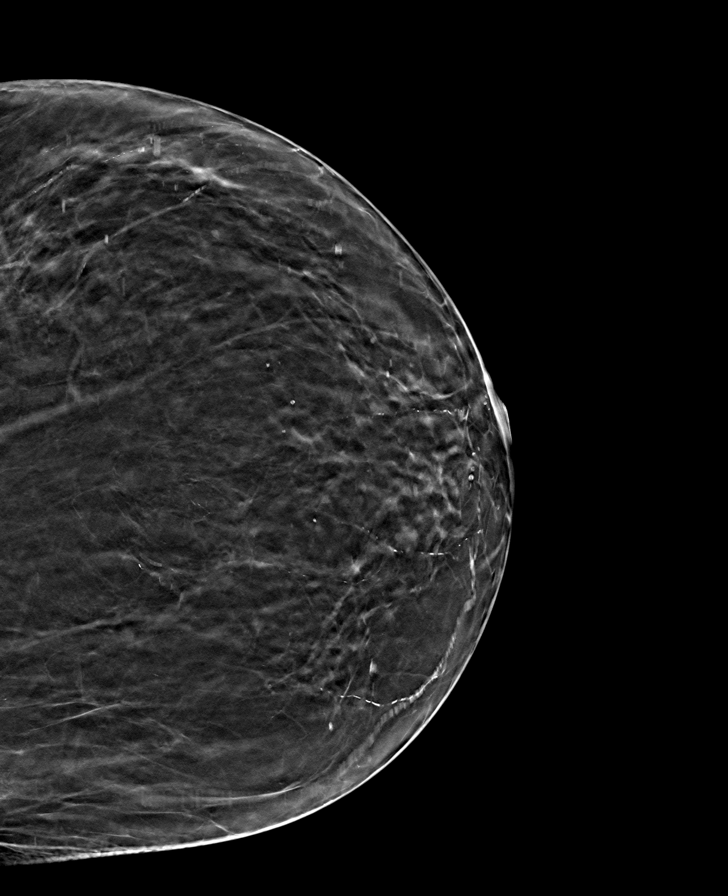

[L MLO tomo · tomo slice 39/76.0]
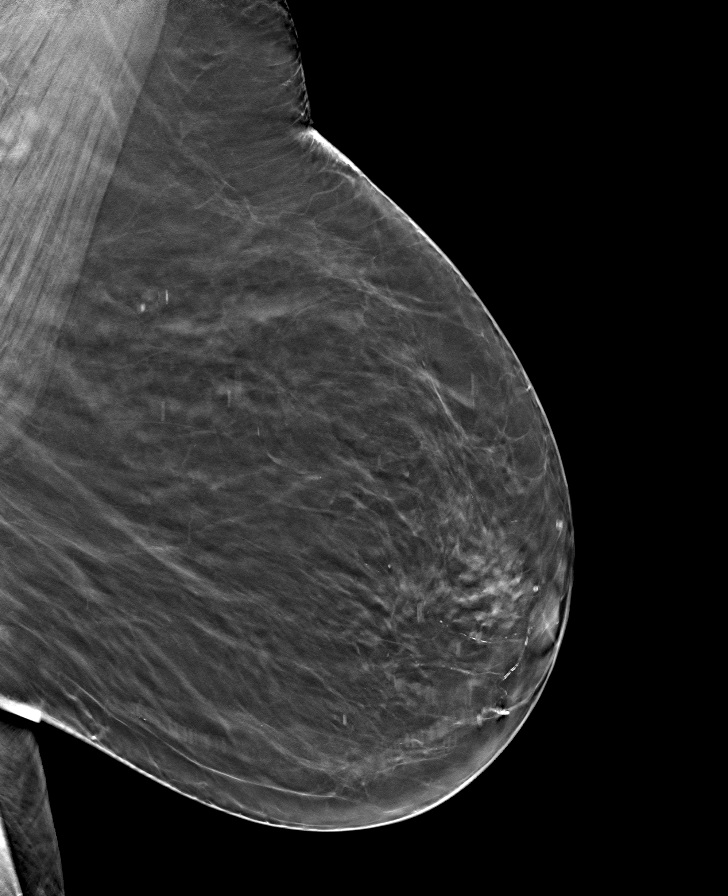

[R CC tomo · tomo slice 29/58.0]
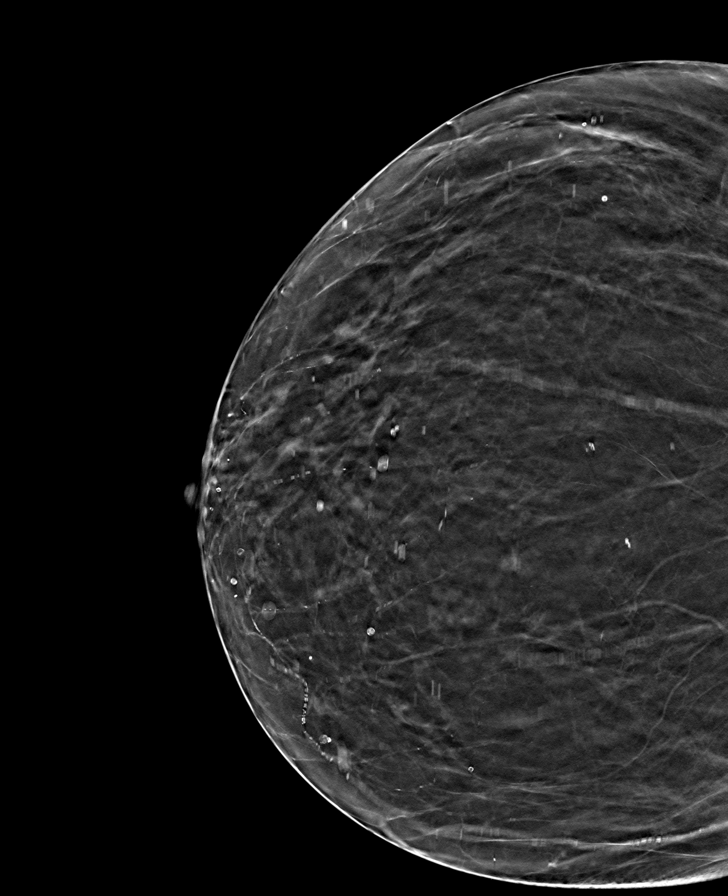

[8 of 24 positions shown; findings below may reference images not displayed]

ACR Breast Density Category b: There are scattered areas of
fibroglandular density.
FINDINGS: There are no findings suspicious for malignancy. The images were
evaluated with computer-aided detection.
IMPRESSION: No mammographic evidence of malignancy. A result letter of this
screening mammogram will be mailed directly to the patient.

RECOMMENDATION:
Screening mammogram in one year. (Code:[OD])

BI-RADS CATEGORY  1: Negative.

## 2021-01-29 IMAGING — US US PELVIS COMPLETE WITH TRANSVAGINAL
1 series · 13 of 25 positions shown · non-contrast
Comparison: None

Correlation: CT abdomen and pelvis [DATE]

CLINICAL DATA: RIGHT lower quadrant pain for 1 year, RIGHT adnexal
cyst on CT, postmenopausal



[Series 1: us pelvic complete with transvaginal · 13 of 74 slices shown]
[im 1/74]
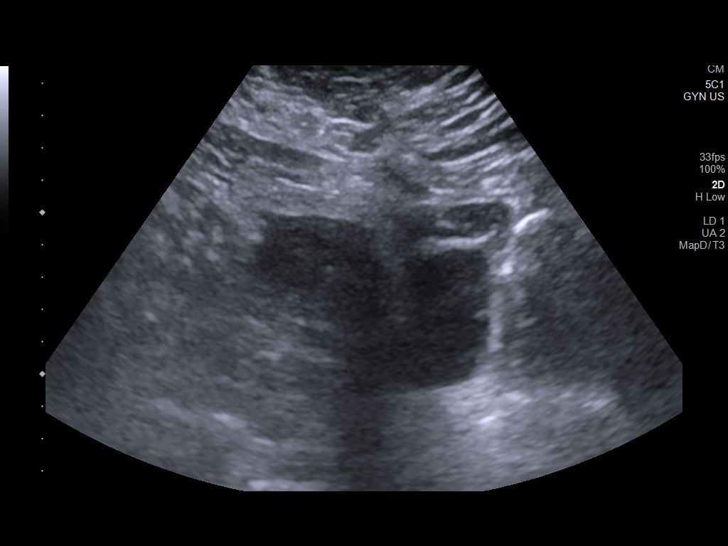
[im 7/74]
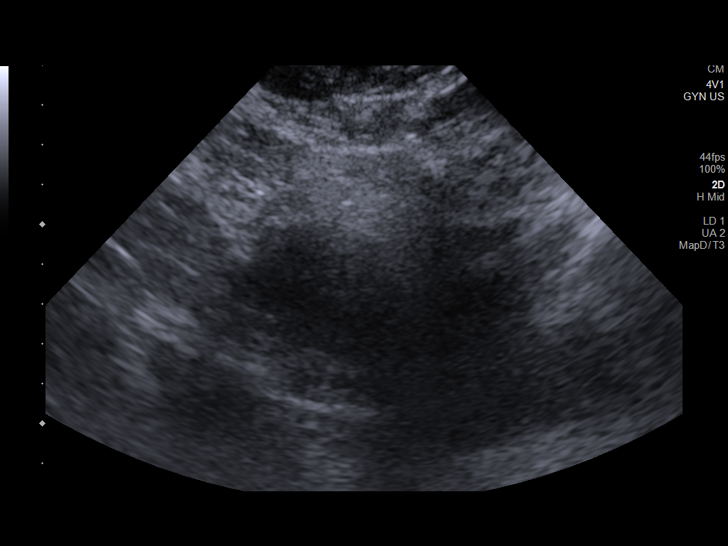
[im 13/74]
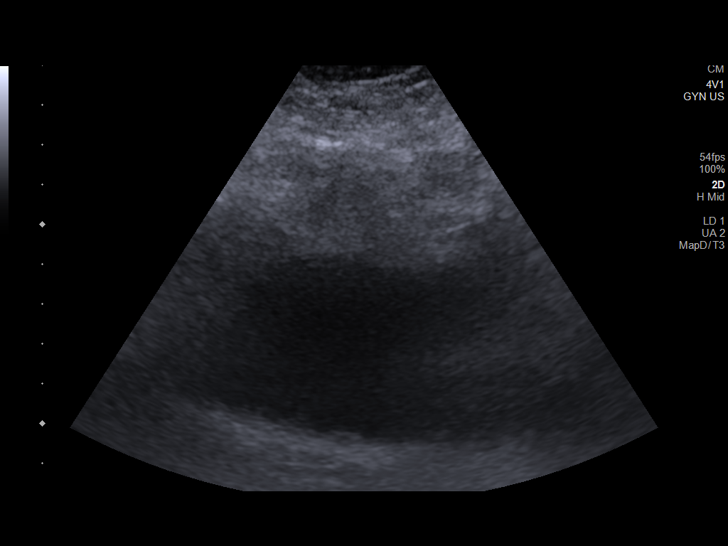
[im 19/74]
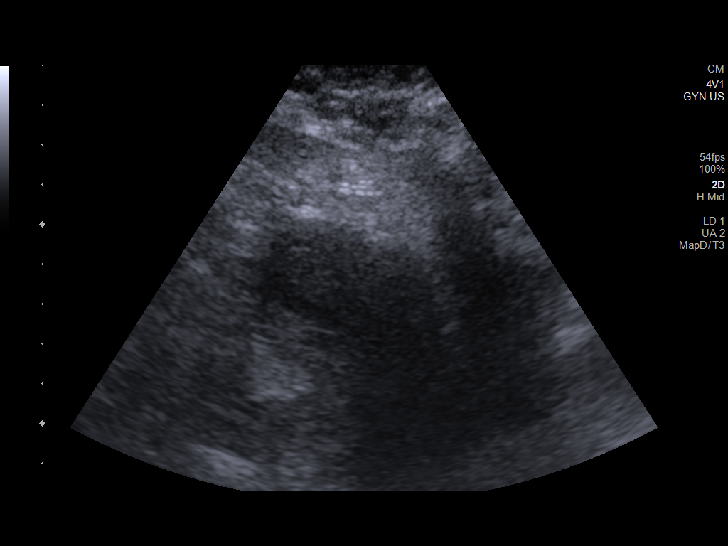
[im 25/74]
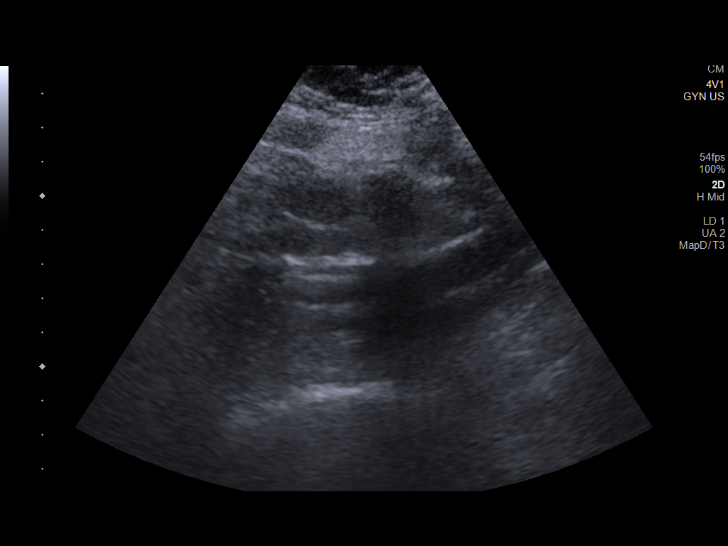
[im 31/74]
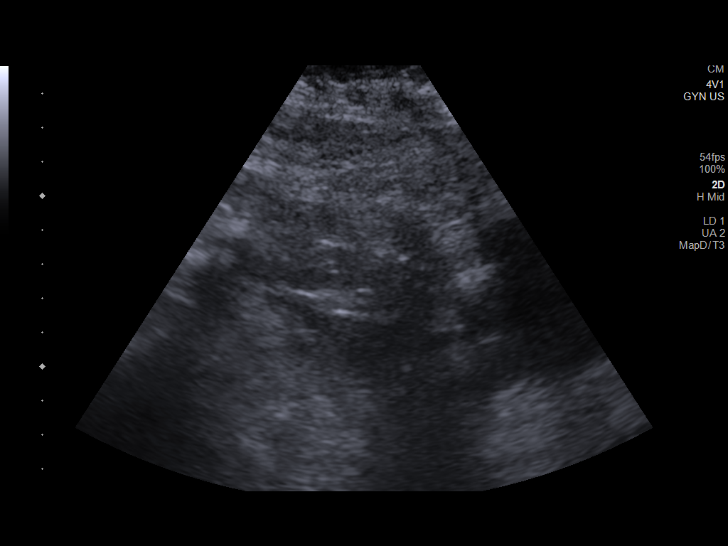
[im 37/74]
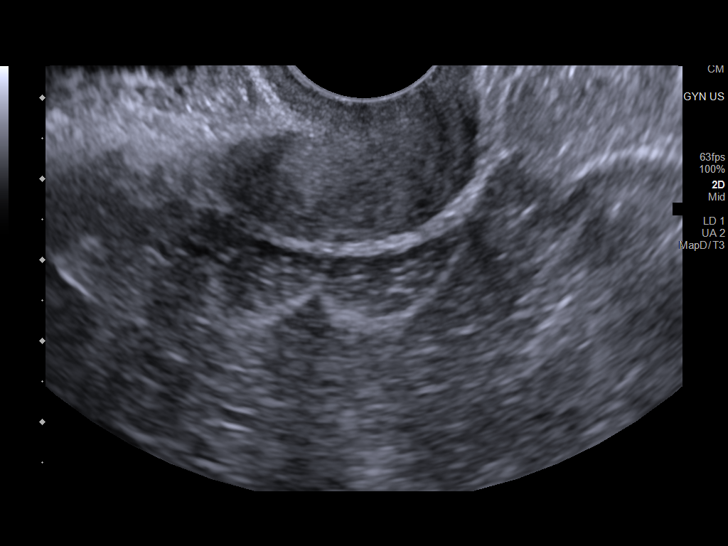
[im 43/74]
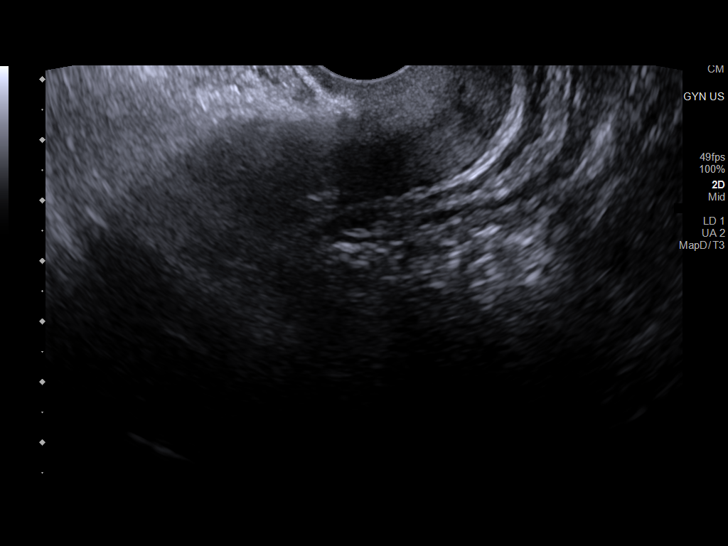
[im 49/74]
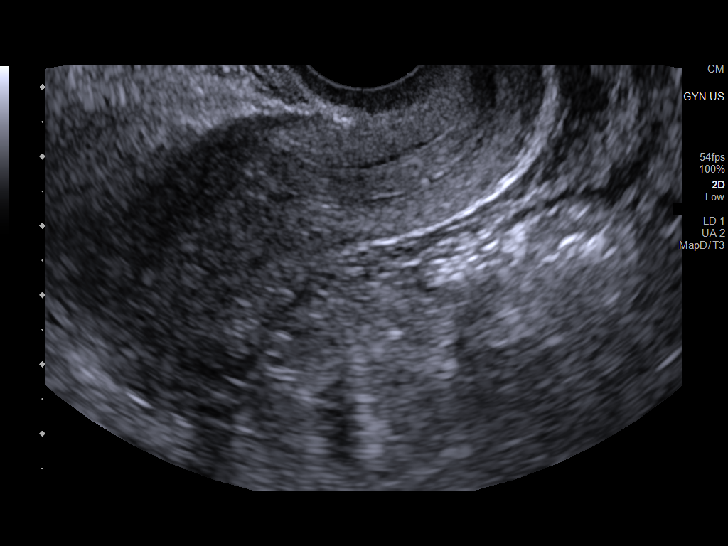
[im 55/74]
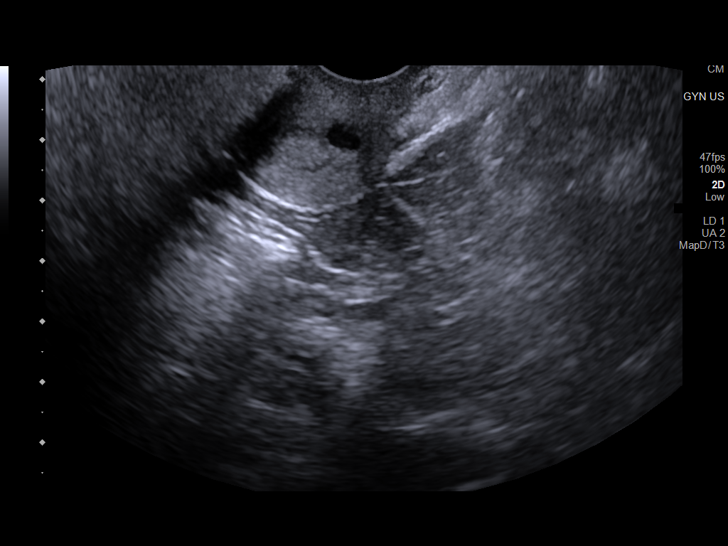
[im 61/74]
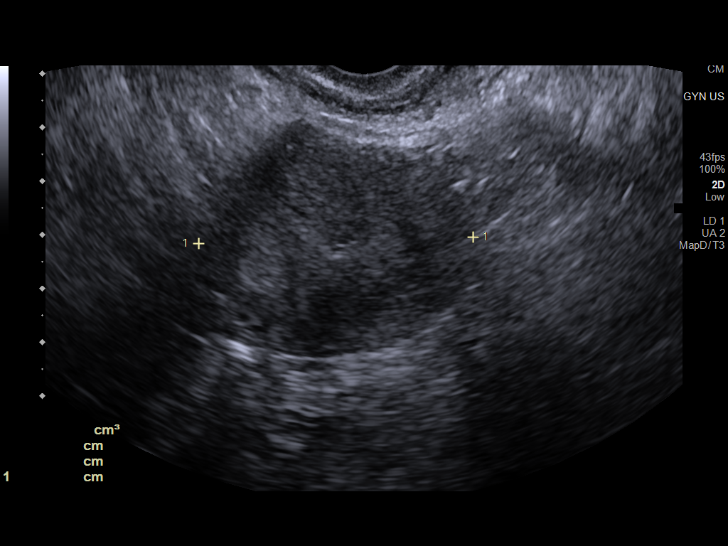
[im 67/74]
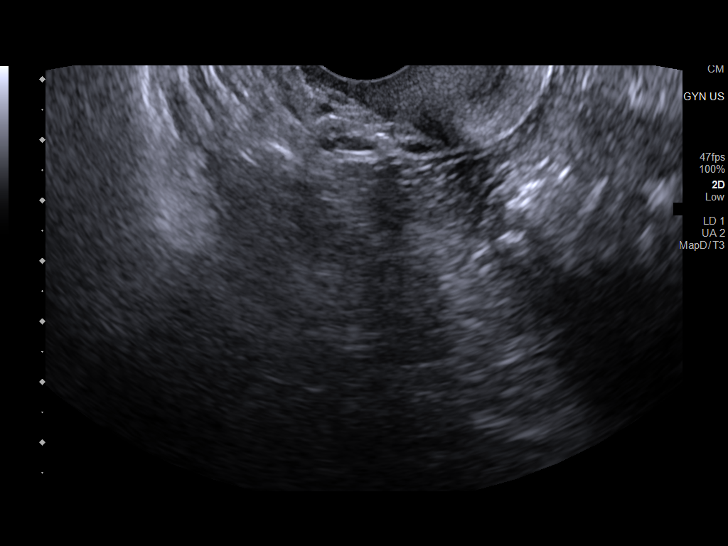
[im 74/74]
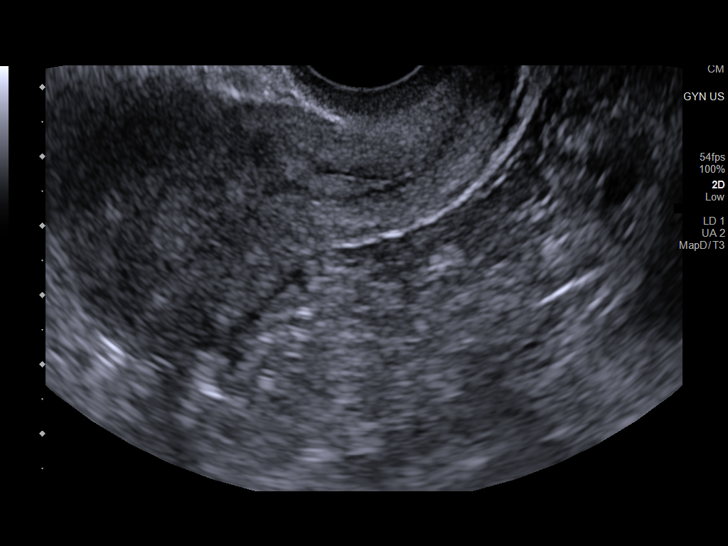

[13 of 25 positions shown; findings below may reference images not displayed]

FINDINGS: Uterus

Measurements: 7.4 x 3.7 x 3.1 cm = volume: 72 mL. Anteverted.
Heterogeneous myometrium. No focal mass. Nabothian cysts at cervix.

Endometrium

Thickness: 8 mm.  No endometrial fluid or focal abnormality

Right ovary

Not visualized, likely obscured by bowel

Left ovary

Not visualized, likely obscured by bowel

Other findings

No free pelvic fluid.  No adnexal masses.
IMPRESSION: Nonvisualization of ovaries; unable to visualize the cystic lesion
seen within the RIGHT ovary on prior CT.

Unremarkable uterus.

8 mm thick endometrial complex, abnormal for an asymptomatic
postmenopausal patient; endometrial sampling should be considered to
exclude carcinoma.

## 2021-01-29 MED ORDER — DIVALPROEX SODIUM ER 500 MG PO TB24: ORAL_TABLET | ORAL | 0 refills | Status: DC

## 2021-01-29 NOTE — Patient Instructions (Signed)
1.ContinueDepakote500 mg in AM, 1000 mg at night 2.Next appointment:7/21 at 1:20  3. Obtain labs - VPA, CMP

## 2021-02-01 ENCOUNTER — Telehealth: Payer: Self-pay | Admitting: Adult Health

## 2021-02-01 ENCOUNTER — Ambulatory Visit (INDEPENDENT_AMBULATORY_CARE_PROVIDER_SITE_OTHER): Payer: Medicare Other | Admitting: "Endocrinology

## 2021-02-01 ENCOUNTER — Other Ambulatory Visit: Payer: Self-pay

## 2021-02-01 ENCOUNTER — Ambulatory Visit (HOSPITAL_COMMUNITY): Payer: Medicare Other | Admitting: Physical Therapy

## 2021-02-01 ENCOUNTER — Encounter (HOSPITAL_COMMUNITY): Payer: Self-pay | Admitting: Physical Therapy

## 2021-02-01 DIAGNOSIS — M545 Low back pain, unspecified: Secondary | ICD-10-CM

## 2021-02-01 DIAGNOSIS — E1165 Type 2 diabetes mellitus with hyperglycemia: Secondary | ICD-10-CM

## 2021-02-01 DIAGNOSIS — M25552 Pain in left hip: Secondary | ICD-10-CM | POA: Diagnosis not present

## 2021-02-01 DIAGNOSIS — M542 Cervicalgia: Secondary | ICD-10-CM | POA: Diagnosis not present

## 2021-02-01 DIAGNOSIS — M79605 Pain in left leg: Secondary | ICD-10-CM | POA: Diagnosis not present

## 2021-02-01 NOTE — Therapy (Signed)
[place], [place], [address] Phone: [phone]   Fax:  [phone]  Physical Therapy Treatment  Patient Details  Name: Laura [name] MRN: [id number] Date of Birth: [birth year] Referring Provider (PT): Matthew Saras [place]   Encounter Date: 02/01/2021   PT End of Session - 02/01/21 1755    Visit Number 15    Number of Visits 18    Date for PT Re-Evaluation 02/05/21    Authorization Type Medicare A, Med[employer] 2nd    Progress Note Due on Visit 18    PT Start Time 1400    Activity Tolerance Patient tolerated treatment well    Behavior During Therapy [facility] for tasks assessed/performed           Past Medical History:  Diagnosis Date  . Asthma   . Atrial fibrillation ([place])   . Bipolar disorder ([place])   . Bronchiectasis ([place])   . Carpal tunnel syndrome of right wrist   . CHF (congestive heart failure) ([place])   . Complication of anesthesia    "my Dr in [place] told me I had an allergic reaction to the combination of the pain meds and the anesthesia" she does not remember what happened but "I eneded up in the ICU for 5 days".  . H/O congenital atrial septal defect (ASD) repair   . Hypogammaglobulinemia ([place])   . Hypothyroid   . 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 Neuropathy   . Neuropathy   . Osteoarthritis   . Pinched nerve in neck   . PTSD (post-traumatic stress disorder)   . Recurrent sinusitis   . Short-term memory loss   . Sleep apnea   . TIA (transient ischemic attack)   . Tremors of nervous system    seeing PCP for this.  . Type 2 diabetes mellitus ([place])   . Urticaria   . Wound infection     Past Surgical History:  Procedure Laterality Date  . 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 BIOPSY  01/26/2021   Procedure: BIOPSY;  Surgeon: [name], DO;  [facility];  Service: Endoscopy;;  gastric  . CARDIAC SURGERY    . CARDIOVERSION    . CARDIOVERSION N/A 08/29/2018   Procedure: CARDIOVERSION;  Surgeon:  Arnoldo Lenis, MD;  Location[facility];  Service: Endoscopy;  Laterality: N/A;  . [place]  . COLONOSCOPY WITH PROPOFOL N/A 01/26/2021   Procedure: COLONOSCOPY WITH PROPOFOL;  Surgeon: [name], DO;  [facility];  Service: Endoscopy;  Laterality: N/A;  am appt, diabetic  . ESOPHAGOGASTRODUODENOSCOPY (EGD) WITH PROPOFOL N/A 01/26/2021   Procedure: ESOPHAGOGASTRODUODENOSCOPY (EGD) WITH PROPOFOL;  Surgeon: [name], DO;  [facility];  Service: Endoscopy;  Laterality: N/A;  . LEFT HEART CATH AND CORONARY ANGIOGRAPHY N/A 08/30/2019   Procedure: LEFT HEART CATH AND CORONARY ANGIOGRAPHY;  Surgeon: [name], MD;  Location: [place] [facility];  Service: Cardiovascular;  Laterality: N/A;  . NASAL SINUS SURGERY  2017  . POLYPECTOMY  01/26/2021   Procedure: POLYPECTOMY;  Surgeon: [name], DO;  [facility];  Service: Endoscopy;;  colon    There were no vitals filed for this visit.   Subjective Assessment - 02/01/21 1754    Subjective Patient states her neck is stiff today. Her back is feeling good.    Pertinent History DB, PTSD, SHF, BBipolar, tremors, depression    Limitations Sitting;Standing;Walking;House hold activities;Lifting  Diagnostic tests xrays    Patient Stated Goals to be able to walk a mile    Currently in Pain? Yes    Pain Score 4     Pain Location Neck    Pain Orientation Posterior    Pain Descriptors / Indicators Aching;Tightness    Pain Type Chronic pain    Pain Onset More than a month ago    Pain Frequency Constant    Pain Onset More than a month ago                         Adult Aquatic Therapy - 02/01/21 1759      Treatment   Gait Dynamic warmup: pool walking, sidestepping, retro walking with floats 3 RT each    Exercises shoulder adduction stretch 3 x 30" each, thoracic excursion 3D x 10 each, heel raise x20, DB push pull 3 x 10, alternating DB  row 3 x10, DB twist 3 x 10, DB butterflies x30, DB adduction 2 x10, single arm pull down 2 x 10, double arm pull down x 10, lat stretch at wall 3 x 30", wall push up x 15                        PT Short Term Goals - 01/07/21 1204      PT SHORT TERM GOAL #1   Title Patient will be independent with initial HEP and self-management strategies to improve functional outcomes    Baseline Reports compliance, demos good return    Time 3    Period Weeks    Status Achieved    Target Date 12/24/20      PT SHORT TERM GOAL #2   Title Patient will be able to demonstrate at least 150 degrees of left active shoulder ROM while sitting to demosntrate improved shoulder mobility.    Baseline Current 128    Time 3    Period Weeks    Status On-going    Target Date 12/24/20      PT SHORT TERM GOAL #3   Title Patient will report at least 25% improvement in overall symptoms and/or function to demonstrate improved functional mobility    Baseline Reports 50%    Status Achieved             PT Long Term Goals - 01/07/21 1205      PT LONG TERM GOAL #1   Title Patient will improve on FOTO score to meet predicted outcomes to demonstrate improved functional mobility.    Time 6    Period Weeks    Status Achieved      PT LONG TERM GOAL #2   Title Patient will report at least 50% overall improvement in subjective complaint to indicate improvement in ability to perform ADLs.    Baseline Reports 50%    Time 6    Period Weeks    Status Achieved      PT LONG TERM GOAL #3   Title Patient will be able to walk a mile to demonstrate improved walking endurance    Time 6    Period Weeks    Status On-going                 Plan - 02/01/21 1756    Clinical Impression Statement Patient tolerated session well today. Initiated session with focus on thoracic and cervical mobilization. Patient required cues and demo for proper form with thoracic excursions.  Patient showing good return with all  other ther ex and no increased complaint of pain. Added wall push up for postural strengthening progression. Patient will continue to benefit from skilled therapy services to reduce deficits and improve functional ability.    Personal Factors and Comorbidities Comorbidity 3+;Comorbidity 2;Comorbidity 1    Comorbidities COPD, Asthma, HTN, A-fib, DM    Stability/Clinical Decision Making Evolving/Moderate complexity    Rehab Potential Good    PT Frequency 2x / week    PT Duration 4 weeks    PT Treatment/Interventions ADLs/Self Care Home Management;Aquatic Therapy;Biofeedback;Canalith Repostioning;Cryotherapy;Electrical Stimulation;Iontophoresis 4mg /ml Dexamethasone;Moist Heat;Traction;Balance training;Manual lymph drainage;Manual techniques;Vestibular;Vasopneumatic Device;Taping;Splinting;Orthotic Fit/Training;Energy conservation;Stair training;Functional mobility training;Therapeutic activities;Therapeutic exercise;Gait training;DME Instruction;Patient/family education;Passive range of motion;Dry needling;Joint Manipulations;Spinal Manipulations;Compression bandaging;Visual/perceptual remediation/compensation;Scar mobilization;Neuromuscular re-education;Ultrasound;Parrafin;Fluidtherapy;Contrast Bath    PT Next Visit Plan Reassess next visit. Add high band row, scapular depression, pec stretch.    PT Home Exercise Plan lumbar extension standing; 12/14/20: tandem stance and marching 3/17 scapular retraction, UT stretching, cervical AROM 3/24 shoulder adduction stretch 4/14 band rows    Consulted and Agree with Plan of Care Patient           Patient will benefit from skilled therapeutic intervention in order to improve the following deficits and impairments:  Abnormal gait,Impaired flexibility,Postural dysfunction,Improper body mechanics,Decreased range of motion,Decreased balance,Decreased mobility,Difficulty walking,Pain,Increased fascial restricitons,Decreased strength,Decreased activity  tolerance,Hypomobility  Visit Diagnosis: Cervicalgia  Low back pain, unspecified back pain laterality, unspecified chronicity, unspecified whether sciatica present     Problem List Patient Active Problem List   Diagnosis Date Noted  . Adnexal cyst 01/19/2021  . Chronic RLQ pain 01/19/2021  . Mixed stress and urge urinary incontinence 01/19/2021  . Other fatigue 12/10/2020  . Abdominal pain, chronic, right lower quadrant 12/08/2020  . Abnormal weight loss 12/08/2020  . Poor appetite 12/08/2020  . Abnormal EKG   . Chronic diastolic (congestive) heart failure ([place]) 08/29/2019  . Shortness of breath 08/29/2019  . Bipolar disorder ([place]) 08/10/2018  . Headache 08/10/2018  . H/O atrial flutter 08/10/2018  . OSA on CPAP 08/10/2018  . Anxiety 08/10/2018  . Uncontrolled type 2 diabetes mellitus with hyperglycemia ([place]) 03/13/2018  . Hypothyroidism 03/13/2018  . Essential hypertension, benign 03/13/2018  . Mixed hyperlipidemia 03/13/2018  . Hypogammaglobulinemia ([place]) 02/06/2018  . Non-allergic rhinitis 02/06/2018  . Severe persistent asthma, uncomplicated 51/88/4166  . Pulmonary nodules 02/06/2018  . Bronchiectasis without complication ([place]) 04/08/1600  . DM type 2 causing vascular disease ([place]) 02/06/2018    6:01 PM, 02/01/21 Josue Hector PT DPT  Physical Therapist with [facility]  [phone] [place] [address] [place], [place], [address] Phone: [phone]   Fax:  [phone]  Name: Laura [name] MRN: [id number] Date of Birth: 10/10/1961

## 2021-02-01 NOTE — Telephone Encounter (Signed)
Pt informed of US results.

## 2021-02-01 NOTE — Progress Notes (Signed)
Freestyle Libre 2 app downloaded to pt's phone, demonstrated to pt how to apply [place] 2 sensor to arm and how to use the app on her phone to obtain readings. Pt's questions were answered, advised pt if she needed anything or had further questions to call the office. Understanding voiced.

## 2021-02-02 DIAGNOSIS — G5603 Carpal tunnel syndrome, bilateral upper limbs: Secondary | ICD-10-CM | POA: Diagnosis not present

## 2021-02-03 ENCOUNTER — Other Ambulatory Visit: Payer: Self-pay

## 2021-02-03 ENCOUNTER — Ambulatory Visit (HOSPITAL_COMMUNITY): Payer: Medicare Other | Admitting: Physical Therapy

## 2021-02-03 ENCOUNTER — Encounter (HOSPITAL_COMMUNITY): Payer: Self-pay | Admitting: Physical Therapy

## 2021-02-03 DIAGNOSIS — M545 Low back pain, unspecified: Secondary | ICD-10-CM

## 2021-02-03 DIAGNOSIS — M542 Cervicalgia: Secondary | ICD-10-CM | POA: Diagnosis not present

## 2021-02-03 DIAGNOSIS — M25552 Pain in left hip: Secondary | ICD-10-CM | POA: Diagnosis not present

## 2021-02-03 DIAGNOSIS — M79605 Pain in left leg: Secondary | ICD-10-CM | POA: Diagnosis not present

## 2021-02-03 NOTE — Therapy (Signed)
[facility] [address] [place], [place], [address] Phone: [phone]   Fax:  [phone]  Physical Therapy Treatment  Patient Details  Name: Laura [name] MRN: [id number] Date of Birth: 05-08-1961 Referring Provider (PT): Matthew Saras PA  Progress Note Reporting Period 01/07/21 to 02/03/21  See note below for Objective Data and Assessment of Progress/Goals.       Encounter Date: 02/03/2021   PT End of Session - 02/03/21 0833    Visit Number 16    Number of Visits 24    Date for PT Re-Evaluation 03/03/21    Authorization Type Medicare A, Medcaid 2nd    Progress Note Due on Visit 24    PT Start Time 0826    PT Stop Time 0910    PT Time Calculation (min) 44 min    Activity Tolerance Patient tolerated treatment well    Behavior During Therapy [facility] for tasks assessed/performed           Past Medical History:  Diagnosis Date  . Asthma   . Atrial fibrillation ([place])   . Bipolar disorder ([place])   . Bronchiectasis ([place])   . Carpal tunnel syndrome of right wrist   . CHF (congestive heart failure) ([place])   . Complication of anesthesia    "my Dr in [place] told me I had an allergic reaction to the combination of the pain meds and the anesthesia" she does not remember what happened but "I eneded up in the ICU for 5 days".  . H/O congenital atrial septal defect (ASD) repair   . Hypogammaglobulinemia ([place])   . Hypothyroid   . 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 Neuropathy   . Neuropathy   . Osteoarthritis   . Pinched nerve in neck   . PTSD (post-traumatic stress disorder)   . Recurrent sinusitis   . Short-term memory loss   . Sleep apnea   . TIA (transient ischemic attack)   . Tremors of nervous system    seeing PCP for this.  . Type 2 diabetes mellitus ([place])   . Urticaria   . Wound infection     Past Surgical History:  Procedure Laterality Date  . 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 BIOPSY  01/26/2021   Procedure: BIOPSY;  Surgeon:  [name], DO;  [facility];  Service: Endoscopy;;  gastric  . CARDIAC SURGERY    . CARDIOVERSION    . CARDIOVERSION N/A 08/29/2018   Procedure: CARDIOVERSION;  Surgeon: Arnoldo Lenis, MD;  [facility];  Service: Endoscopy;  Laterality: N/A;  . [place]  . COLONOSCOPY WITH PROPOFOL N/A 01/26/2021   Procedure: COLONOSCOPY WITH PROPOFOL;  Surgeon: [name], DO;  [facility];  Service: Endoscopy;  Laterality: N/A;  am appt, diabetic  . ESOPHAGOGASTRODUODENOSCOPY (EGD) WITH PROPOFOL N/A 01/26/2021   Procedure: ESOPHAGOGASTRODUODENOSCOPY (EGD) WITH PROPOFOL;  Surgeon: [name], DO;  [facility];  Service: Endoscopy;  Laterality: N/A;  . LEFT HEART CATH AND CORONARY ANGIOGRAPHY N/A 08/30/2019   Procedure: LEFT HEART CATH AND CORONARY ANGIOGRAPHY;  Surgeon: [name], MD;  Location: [facility];  Service: Cardiovascular;  Laterality: N/A;  . NASAL SINUS SURGERY  2017  . POLYPECTOMY  01/26/2021   Procedure: POLYPECTOMY;  Surgeon: [name], DO;  [facility];  Service: Endoscopy;;  colon    There were no vitals filed for this visit.  Subjective Assessment - 02/03/21 0835    Subjective Patient says she is doing ok this morning. Slight stiffness is neck "feels like C6 all the time". Having some pulling in LT shoulder and has wrist splint for RT hand carpal tunnel.    Pertinent History DB, PTSD, SHF, BBipolar, tremors, depression    Limitations Sitting;Standing;Walking;House hold activities;Lifting    Diagnostic tests xrays    Patient Stated Goals to be able to walk a mile    Currently in Pain? Yes    Pain Score 3     Pain Location Neck    Pain Orientation Posterior    Pain Descriptors / Indicators Aching;Tightness    Pain Type Chronic pain    Pain Onset More than a month ago    Pain Frequency Constant    Aggravating Factors  driving, lifting    Pain  Relieving Factors message, therapy, stretching    Effect of Pain on Daily Activities Limits    Pain Onset More than a month ago              [facility] PT Assessment - 02/03/21 0001      Assessment   Medical Diagnosis neck pain with radiculopathy and low back pain    Referring Provider (PT) Kirstin Shepperson PA      Precautions   Precautions None      Restrictions   Weight Bearing Restrictions No      Home Environment   Living Environment Private residence    Living Arrangements Other relatives    Available Help at Discharge Family      Prior Function   Level of Independence Independent    Vocation Part time employment    Designer, television/film set at [employer]   Overall Cognitive Status Within Functional Limits for tasks assessed      Observation/Other Assessments   Focus on Therapeutic Outcomes (FOTO)  42% function   was 41% function (cervical)     AROM   Right Shoulder Flexion 135 Degrees   seated (was 155)   Left Shoulder Flexion 130 Degrees   seated (was 128)   Cervical Flexion 50   was 52   Cervical Extension 30   was 20   Cervical - Right Rotation 46   was 55   Cervical - Left Rotation 63   was 60                        [facility] Adult PT Treatment/Exercise - 02/03/21 0001      Neck Exercises: Seated   Cervical Isometrics Right lateral flexion;Left lateral flexion;5 secs;5 reps    Neck Retraction 10 reps    Other Seated Exercise cervical excursion 3D x10      Manual Therapy   Manual Therapy Soft tissue mobilization    Manual therapy comments all manual interventions performed independently of other intervention    Soft tissue mobilization IASTM to bilateral upper trap, levator, cervical paraspinals      Neck Exercises: Stretches   Neck Stretch 3 reps;30 seconds    Chest Stretch 3 reps;30 seconds                    PT Short Term Goals - 02/03/21 [id number]      PT SHORT TERM GOAL #1   Title Patient will be independent  with initial HEP and self-management strategies to improve functional outcomes    Baseline Reports compliance, demos good  return    Time 3    Period Weeks    Status Achieved    Target Date 12/24/20      PT SHORT TERM GOAL #2   Title Patient will be able to demonstrate at least 150 degrees of left active shoulder ROM while sitting to demosntrate improved shoulder mobility.    Baseline Current 130    Time 3    Period Weeks    Status On-going    Target Date 12/24/20      PT SHORT TERM GOAL #3   Title Patient will report at least 25% improvement in overall symptoms and/or function to demonstrate improved functional mobility    Baseline Reports 50%    Status Achieved             PT Long Term Goals - 02/03/21 0925      PT LONG TERM GOAL #1   Title Patient will improve on FOTO score to meet predicted outcomes to demonstrate improved functional mobility.    Baseline Lumbar score achieved, cervical score ongoing    Time 6    Period Weeks    Status On-going      PT LONG TERM GOAL #2   Title Patient will report at least 50% overall improvement in subjective complaint to indicate improvement in ability to perform ADLs.    Baseline Reports 50%    Time 6    Period Weeks    Status Achieved      PT LONG TERM GOAL #3   Title Patient will be able to walk a mile to demonstrate improved walking endurance    Baseline Patient reports she is now able to do this    Time 6    Period Weeks    Status Achieved                 Plan - 02/03/21 9937    Clinical Impression Statement Patient making steady progress toward therapy goals. Patient with improved activity tolerance and decreased pain overall. Patient still limited by cervical and shoulder AROM restriction. Cervical mobility improving somewhat, shoulder mobility appears correlated to level of soft tissue restrictions and varies day to day, patient has "good days and bad days". At this time, patient would likely benefit from  continued therapy services to address remaining deficits to reduce shoulder and neck pain and improve cervical and shoulder mobility for improved functional ability.    Personal Factors and Comorbidities Comorbidity 3+;Comorbidity 2;Comorbidity 1    Comorbidities COPD, Asthma, HTN, A-fib, DM    Stability/Clinical Decision Making Evolving/Moderate complexity    Rehab Potential Good    PT Frequency 2x / week    PT Duration 4 weeks    PT Treatment/Interventions ADLs/Self Care Home Management;Aquatic Therapy;Biofeedback;Canalith Repostioning;Cryotherapy;Electrical Stimulation;Iontophoresis 4mg /ml Dexamethasone;Moist Heat;Traction;Balance training;Manual lymph drainage;Manual techniques;Vestibular;Vasopneumatic Device;Taping;Splinting;Orthotic Fit/Training;Energy conservation;Stair training;Functional mobility training;Therapeutic activities;Therapeutic exercise;Gait training;DME Instruction;Patient/family education;Passive range of motion;Dry needling;Joint Manipulations;Spinal Manipulations;Compression bandaging;Visual/perceptual remediation/compensation;Scar mobilization;Neuromuscular re-education;Ultrasound;Parrafin;Fluidtherapy;Contrast Bath    PT Next Visit Plan Continue postural strengthening. Focus on LT shoulder mobility. Add high band row, wall slides, scapular depression    PT Home Exercise Plan lumbar extension standing; 12/14/20: tandem stance and marching 3/17 scapular retraction, UT stretching, cervical AROM 3/24 shoulder adduction stretch 4/14 band rows    Consulted and Agree with Plan of Care Patient           Patient will benefit from skilled therapeutic intervention in order to improve the following deficits and impairments:  Abnormal gait,Impaired flexibility,Postural dysfunction,Improper body mechanics,Decreased range  of motion,Decreased balance,Decreased mobility,Difficulty walking,Pain,Increased fascial restricitons,Decreased strength,Decreased activity tolerance,Hypomobility  Visit  Diagnosis: Cervicalgia  Low back pain, unspecified back pain laterality, unspecified chronicity, unspecified whether sciatica present     Problem List Patient Active Problem List   Diagnosis Date Noted  . Adnexal cyst 01/19/2021  . Chronic RLQ pain 01/19/2021  . Mixed stress and urge urinary incontinence 01/19/2021  . Other fatigue 12/10/2020  . Abdominal pain, chronic, right lower quadrant 12/08/2020  . Abnormal weight loss 12/08/2020  . Poor appetite 12/08/2020  . Abnormal EKG   . Chronic diastolic (congestive) heart failure ([place]) 08/29/2019  . Shortness of breath 08/29/2019  . Bipolar disorder ([place]) 08/10/2018  . Headache 08/10/2018  . H/O atrial flutter 08/10/2018  . OSA on CPAP 08/10/2018  . Anxiety 08/10/2018  . Uncontrolled type 2 diabetes mellitus with hyperglycemia ([place]) 03/13/2018  . Hypothyroidism 03/13/2018  . Essential hypertension, benign 03/13/2018  . Mixed hyperlipidemia 03/13/2018  . Hypogammaglobulinemia ([place]) 02/06/2018  . Non-allergic rhinitis 02/06/2018  . Severe persistent asthma, uncomplicated 70/17/7939  . Pulmonary nodules 02/06/2018  . Bronchiectasis without complication ([place]) 03/00/9233  . DM type 2 causing vascular disease ([place]) 02/06/2018   9:29 AM, 02/03/21 Josue Hector PT DPT  Physical Therapist with [facility]  [phone] [place] [address] [place], [place], [address] Phone: [phone]   Fax:  [phone]  Name: Laura [name] MRN: [id number] Date of Birth: 1961/09/22

## 2021-02-04 ENCOUNTER — Ambulatory Visit: Payer: Medicare Other | Admitting: Nutrition

## 2021-02-04 ENCOUNTER — Encounter (HOSPITAL_COMMUNITY): Payer: Medicare Other | Admitting: Physical Therapy

## 2021-02-09 ENCOUNTER — Telehealth: Payer: Self-pay | Admitting: Neurology

## 2021-02-09 NOTE — Telephone Encounter (Signed)
Patient wants a referral to see a neurologist that can help her with her Neuropathy.  Please call

## 2021-02-09 NOTE — Telephone Encounter (Signed)
She will probably need to address via her PCP.  She was referred to me for that tremor and I don't take care of neuropathy.  We did do an EMG for her carpal tunnel and referred her out for that.

## 2021-02-09 NOTE — Telephone Encounter (Signed)
Called patient and left a message for a call back.  

## 2021-02-10 ENCOUNTER — Ambulatory Visit: Payer: Medicare Other | Admitting: Adult Health

## 2021-02-10 ENCOUNTER — Other Ambulatory Visit: Payer: Self-pay | Admitting: "Endocrinology

## 2021-02-11 ENCOUNTER — Ambulatory Visit: Payer: Medicare Other | Admitting: Neurology

## 2021-02-11 ENCOUNTER — Ambulatory Visit (HOSPITAL_COMMUNITY): Payer: Medicare Other | Attending: Orthopedic Surgery | Admitting: Physical Therapy

## 2021-02-11 ENCOUNTER — Other Ambulatory Visit: Payer: Self-pay

## 2021-02-11 ENCOUNTER — Encounter (HOSPITAL_COMMUNITY): Payer: Self-pay | Admitting: Physical Therapy

## 2021-02-11 DIAGNOSIS — M25552 Pain in left hip: Secondary | ICD-10-CM | POA: Diagnosis not present

## 2021-02-11 DIAGNOSIS — M542 Cervicalgia: Secondary | ICD-10-CM | POA: Diagnosis not present

## 2021-02-11 DIAGNOSIS — M545 Low back pain, unspecified: Secondary | ICD-10-CM | POA: Diagnosis not present

## 2021-02-11 DIAGNOSIS — M79605 Pain in left leg: Secondary | ICD-10-CM | POA: Diagnosis not present

## 2021-02-11 DIAGNOSIS — E1159 Type 2 diabetes mellitus with other circulatory complications: Secondary | ICD-10-CM | POA: Insufficient documentation

## 2021-02-11 DIAGNOSIS — R5383 Other fatigue: Secondary | ICD-10-CM | POA: Insufficient documentation

## 2021-02-11 NOTE — Patient Instructions (Signed)
Access Code: [email or web address] URL: [email or web address] Date: 02/11/2021 Prepared by: Josue Hector  Exercises Shoulder Flexion Wall Slide with Towel - 2-3 x daily - 7 x weekly - 2 sets - 10 reps - 5 sec hold

## 2021-02-11 NOTE — Therapy (Signed)
[facility], [place], [address] Phone: [phone]   Fax:  [phone]  Physical Therapy Treatment  Patient Details  Name: Laura [name] MRN: [id number] Date of Birth: [birth year] Referring Provider (PT): Matthew Saras [place]   Encounter Date: 02/11/2021   PT End of Session - 02/11/21 1528    Visit Number 17    Number of Visits 24    Date for PT Re-Evaluation 03/03/21    Authorization Type Medicare A, Medcaid 2nd    Progress Note Due on Visit 24    PT Start Time 1522    PT Stop Time 1607    PT Time Calculation (min) 45 min    Activity Tolerance Patient tolerated treatment well    Behavior During Therapy [facility] for tasks assessed/performed           Past Medical History:  Diagnosis Date  . Asthma   . Atrial fibrillation ([place])   . Bipolar disorder ([place])   . Bronchiectasis ([place])   . Carpal tunnel syndrome of right wrist   . CHF (congestive heart failure) ([place])   . Complication of anesthesia    "my Dr in [place] told me I had an allergic reaction to the combination of the pain meds and the anesthesia" she does not remember what happened but "I eneded up in the ICU for 5 days".  . H/O congenital atrial septal defect (ASD) repair   . Hypogammaglobulinemia ([place])   . Hypothyroid   . 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 Neuropathy   . Neuropathy   . Osteoarthritis   . Pinched nerve in neck   . PTSD (post-traumatic stress disorder)   . Recurrent sinusitis   . Short-term memory loss   . Sleep apnea   . TIA (transient ischemic attack)   . Tremors of nervous system    seeing PCP for this.  . Type 2 diabetes mellitus ([place])   . Urticaria   . Wound infection     Past Surgical History:  Procedure Laterality Date  . 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 BIOPSY  01/26/2021   Procedure: BIOPSY;  Surgeon: [name], DO;  [facility];  Service: Endoscopy;;  gastric  . CARDIAC SURGERY    . CARDIOVERSION    . CARDIOVERSION  N/A 08/29/2018   Procedure: CARDIOVERSION;  Surgeon: Arnoldo Lenis, MD;  [facility];  Service: Endoscopy;  Laterality: N/A;  . [place]  . COLONOSCOPY WITH PROPOFOL N/A 01/26/2021   Procedure: COLONOSCOPY WITH PROPOFOL;  Surgeon: [name], DO;  [facility];  Service: Endoscopy;  Laterality: N/A;  am appt, diabetic  . ESOPHAGOGASTRODUODENOSCOPY (EGD) WITH PROPOFOL N/A 01/26/2021   Procedure: ESOPHAGOGASTRODUODENOSCOPY (EGD) WITH PROPOFOL;  Surgeon: [name], DO;  [facility];  Service: Endoscopy;  Laterality: N/A;  . LEFT HEART CATH AND CORONARY ANGIOGRAPHY N/A 08/30/2019   Procedure: LEFT HEART CATH AND CORONARY ANGIOGRAPHY;  Surgeon: [name], MD;  Location: [facility];  Service: Cardiovascular;  Laterality: N/A;  . NASAL SINUS SURGERY  2017  . POLYPECTOMY  01/26/2021   Procedure: POLYPECTOMY;  Surgeon: [name], DO;  [facility];  Service: Endoscopy;;  colon    There were no vitals filed for this visit.   Subjective Assessment - 02/11/21 1531    Subjective Patient says her neck is sore. She hates her splint. Neck catches when she  turns her head    Pertinent History DB, PTSD, SHF, BBipolar, tremors, depression    Limitations Sitting;Standing;Walking;House hold activities;Lifting    Diagnostic tests xrays    Patient Stated Goals to be able to walk a mile    Currently in Pain? Yes    Pain Score 5     Pain Location Neck    Pain Orientation Posterior    Pain Descriptors / Indicators Aching;Tightness    Pain Type Chronic pain    Pain Onset More than a month ago    Pain Frequency Constant    Pain Onset More than a month ago                             [facility] Adult PT Treatment/Exercise - 02/11/21 0001      Neck Exercises: Standing   Other Standing Exercises wall slides 10 x 5"    Other Standing Exercises shoulder extension, shoulder rows- with  green theraband 2x10 5" holds each      Neck Exercises: Seated   Neck Retraction 15 reps    Other Seated Exercise cervical excursion 3D x10      Manual Therapy   Manual Therapy Soft tissue mobilization    Manual therapy comments all manual interventions performed independently of other intervention    Soft tissue mobilization IASTM to bilateral upper trap, levator, peri scapular area using theragun lv 10      Neck Exercises: Stretches   Upper Trapezius Stretch Right;Left;3 reps;30 seconds    Other Neck Stretches shoulder cross body adduction stretch LT 3 x 30"                    PT Short Term Goals - 02/03/21 0924      PT SHORT TERM GOAL #1   Title Patient will be independent with initial HEP and self-management strategies to improve functional outcomes    Baseline Reports compliance, demos good return    Time 3    Period Weeks    Status Achieved    Target Date 12/24/20      PT SHORT TERM GOAL #2   Title Patient will be able to demonstrate at least 150 degrees of left active shoulder ROM while sitting to demosntrate improved shoulder mobility.    Baseline Current 130    Time 3    Period Weeks    Status On-going    Target Date 12/24/20      PT SHORT TERM GOAL #3   Title Patient will report at least 25% improvement in overall symptoms and/or function to demonstrate improved functional mobility    Baseline Reports 50%    Status Achieved             PT Long Term Goals - 02/03/21 0925      PT LONG TERM GOAL #1   Title Patient will improve on FOTO score to meet predicted outcomes to demonstrate improved functional mobility.    Baseline Lumbar score achieved, cervical score ongoing    Time 6    Period Weeks    Status On-going      PT LONG TERM GOAL #2   Title Patient will report at least 50% overall improvement in subjective complaint to indicate improvement in ability to perform ADLs.    Baseline Reports 50%    Time 6    Period Weeks    Status Achieved       PT LONG TERM  GOAL #3   Title Patient will be able to walk a mile to demonstrate improved walking endurance    Baseline Patient reports she is now able to do this    Time 6    Period Weeks    Status Achieved                 Plan - 02/11/21 1635    Clinical Impression Statement Patient reporting light increased pain since being away on vacation last week. She admits she was not able to complete as much of her HEP and stretching. Activity graded per patient tolerance today. Added wall slides for improving shoulder flexion mobility. Patient news challenged with this but showed improved overhead mobility following. Added IASTM using theragun for neck stiffness today. Patient reports improved neck pain following.    Personal Factors and Comorbidities Comorbidity 3+;Comorbidity 2;Comorbidity 1    Comorbidities COPD, Asthma, HTN, A-fib, DM    Stability/Clinical Decision Making Evolving/Moderate complexity    Rehab Potential Good    PT Frequency 2x / week    PT Duration 4 weeks    PT Treatment/Interventions ADLs/Self Care Home Management;Aquatic Therapy;Biofeedback;Canalith Repostioning;Cryotherapy;Electrical Stimulation;Iontophoresis 4mg /ml Dexamethasone;Moist Heat;Traction;Balance training;Manual lymph drainage;Manual techniques;Vestibular;Vasopneumatic Device;Taping;Splinting;Orthotic Fit/Training;Energy conservation;Stair training;Functional mobility training;Therapeutic activities;Therapeutic exercise;Gait training;DME Instruction;Patient/family education;Passive range of motion;Dry needling;Joint Manipulations;Spinal Manipulations;Compression bandaging;Visual/perceptual remediation/compensation;Scar mobilization;Neuromuscular re-education;Ultrasound;Parrafin;Fluidtherapy;Contrast Bath    PT Next Visit Plan Continue postural strengthening. Focus on LT shoulder mobility. Add scapular depression    PT Home Exercise Plan lumbar extension standing; 12/14/20: tandem stance and marching 3/17  scapular retraction, UT stretching, cervical AROM 3/24 shoulder adduction stretch 4/14 band rows 5/5/ wall slides    Consulted and Agree with Plan of Care Patient           Patient will benefit from skilled therapeutic intervention in order to improve the following deficits and impairments:  Abnormal gait,Impaired flexibility,Postural dysfunction,Improper body mechanics,Decreased range of motion,Decreased balance,Decreased mobility,Difficulty walking,Pain,Increased fascial restricitons,Decreased strength,Decreased activity tolerance,Hypomobility  Visit Diagnosis: Cervicalgia  Low back pain, unspecified back pain laterality, unspecified chronicity, unspecified whether sciatica present     Problem List Patient Active Problem List   Diagnosis Date Noted  . Adnexal cyst 01/19/2021  . Chronic RLQ pain 01/19/2021  . Mixed stress and urge urinary incontinence 01/19/2021  . Other fatigue 12/10/2020  . Abdominal pain, chronic, right lower quadrant 12/08/2020  . Abnormal weight loss 12/08/2020  . Poor appetite 12/08/2020  . Abnormal EKG   . Chronic diastolic (congestive) heart failure ([place]) 08/29/2019  . Shortness of breath 08/29/2019  . Bipolar disorder ([place]) 08/10/2018  . Headache 08/10/2018  . H/O atrial flutter 08/10/2018  . OSA on CPAP 08/10/2018  . Anxiety 08/10/2018  . Uncontrolled type 2 diabetes mellitus with hyperglycemia ([place]) 03/13/2018  . Hypothyroidism 03/13/2018  . Essential hypertension, benign 03/13/2018  . Mixed hyperlipidemia 03/13/2018  . Hypogammaglobulinemia ([place]) 02/06/2018  . Non-allergic rhinitis 02/06/2018  . Severe persistent asthma, uncomplicated 22/29/7989  . Pulmonary nodules 02/06/2018  . Bronchiectasis without complication ([place]) 21/19/4174  . DM type 2 causing vascular disease ([place]) 02/06/2018    4:39 PM, 02/11/21 Josue Hector PT DPT  Physical Therapist with [facility]  [phone] [place] [address] [place], [place], [address] Phone: [phone]   Fax:  [phone]  Name: Laura [name] MRN: [id number] Date of Birth: 12-May-1961

## 2021-02-12 ENCOUNTER — Encounter: Payer: Self-pay | Admitting: "Endocrinology

## 2021-02-12 ENCOUNTER — Ambulatory Visit (INDEPENDENT_AMBULATORY_CARE_PROVIDER_SITE_OTHER): Payer: Medicare Other | Admitting: "Endocrinology

## 2021-02-12 VITALS — BP 110/56 | HR 68 | Ht 64.0 in | Wt 199.0 lb

## 2021-02-12 DIAGNOSIS — E039 Hypothyroidism, unspecified: Secondary | ICD-10-CM | POA: Diagnosis not present

## 2021-02-12 DIAGNOSIS — E1165 Type 2 diabetes mellitus with hyperglycemia: Secondary | ICD-10-CM

## 2021-02-12 LAB — POCT GLYCOSYLATED HEMOGLOBIN (HGB A1C): HbA1c, POC (controlled diabetic range): 6.2 % (ref 0.0–7.0)

## 2021-02-12 NOTE — Progress Notes (Signed)
02/12/2021, 1:02 PM   Endocrinology follow-up note   Subjective:    Patient ID: Laura [name], female    DOB: 12/09/1960.  Laura [name] was seen since 2019 for management of diabetes, also seen during her last visit in relation to her steroid exposure to rule out adrenal insufficiency.  PMD: [name], MD.   Past Medical History:  Diagnosis Date  . Asthma   . Atrial fibrillation ([place])   . Bipolar disorder ([place])   . Bronchiectasis ([place])   . Carpal tunnel syndrome of right wrist   . CHF (congestive heart failure) ([place])   . Complication of anesthesia    "my Dr in [place] told me I had an allergic reaction to the combination of the pain meds and the anesthesia" she does not remember what happened but "I eneded up in the ICU for 5 days".  . H/O congenital atrial septal defect (ASD) repair   . Hypogammaglobulinemia ([place])   . Hypothyroid   . 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 Neuropathy   . Neuropathy   . Osteoarthritis   . Pinched nerve in neck   . PTSD (post-traumatic stress disorder)   . Recurrent sinusitis   . Short-term memory loss   . Sleep apnea   . TIA (transient ischemic attack)   . Tremors of nervous system    seeing PCP for this.  . Type 2 diabetes mellitus ([place])   . Urticaria   . Wound infection    Past Surgical History:  Procedure Laterality Date  . 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 BIOPSY  01/26/2021   Procedure: BIOPSY;  Surgeon: [name], DO;  [facility];  Service: Endoscopy;;  gastric  . CARDIAC SURGERY    . CARDIOVERSION    . CARDIOVERSION N/A 08/29/2018   Procedure: CARDIOVERSION;  Surgeon: Arnoldo Lenis, MD;  [facility];  Service: Endoscopy;  Laterality: N/A;  . [place]  . COLONOSCOPY WITH PROPOFOL N/A 01/26/2021   Procedure: COLONOSCOPY WITH PROPOFOL;  Surgeon: [name], DO;  [facility];  Service:  Endoscopy;  Laterality: N/A;  am appt, diabetic  . ESOPHAGOGASTRODUODENOSCOPY (EGD) WITH PROPOFOL N/A 01/26/2021   Procedure: ESOPHAGOGASTRODUODENOSCOPY (EGD) WITH PROPOFOL;  Surgeon: [name], DO;  [facility];  Service: Endoscopy;  Laterality: N/A;  . LEFT HEART CATH AND CORONARY ANGIOGRAPHY N/A 08/30/2019   Procedure: LEFT HEART CATH AND CORONARY ANGIOGRAPHY;  Surgeon: [name], MD;  Location: [facility];  Service: Cardiovascular;  Laterality: N/A;  . NASAL SINUS SURGERY  2017  . POLYPECTOMY  01/26/2021   Procedure: POLYPECTOMY;  Surgeon: [name], DO;  [facility];  Service: Endoscopy;;  colon   Social History   Socioeconomic History  . Marital status: Widowed    Spouse name: Not on file  . Number of children: Not on file  . Years of education: Not on file  . Highest education level: Not on file  Occupational History  . Not on file  Tobacco Use  . Smoking status: Former Smoker    Packs/day: 1.00  Years: 28.00    Pack years: 28.00    Types: Cigarettes    Start date: 44    Quit date: 2008    Years since quitting: 14.3  . Smokeless tobacco: Never Used  Vaping Use  . Vaping Use: Never used  Substance and Sexual Activity  . Alcohol use: Not Currently  . Drug use: Not Currently  . Sexual activity: Not Currently    Birth control/protection: Post-menopausal  Other Topics Concern  . Not on file  Social History Narrative  . Not on file   Social Determinants of Health   Financial Resource Strain: Medium Risk  . Difficulty of Paying Living Expenses: Somewhat hard  Food Insecurity: Food Insecurity Present  . Worried About Charity fundraiser in the Last Year: Often true  . Ran Out of Food in the Last Year: Often true  Transportation Needs: No Transportation Needs  . Lack of Transportation (Medical): No  . Lack of Transportation (Non-Medical): No  Physical Activity: Insufficiently Active  . Days of Exercise per  Week: 3 days  . Minutes of Exercise per Session: 20 min  Stress: Stress Concern Present  . Feeling of Stress : To some extent  Social Connections: Moderately Isolated  . Frequency of Communication with Friends and Family: More than three times a week  . Frequency of Social Gatherings with Friends and Family: More than three times a week  . Attends Religious Services: 1 to 4 times per year  . Active Member of Clubs or Organizations: No  . Attends Archivist Meetings: Never  . Marital Status: Widowed   Outpatient Encounter Medications as of 02/12/2021  Medication Sig  . acetaminophen (TYLENOL) 325 MG tablet Take 2 tablets (650 mg total) by mouth every 6 (six) hours as needed for mild pain, fever or headache.  . albuterol (PROVENTIL) (2.5 MG/3ML) 0.083% nebulizer solution Take 3 mLs (2.5 mg total) by nebulization every 4 (four) hours as needed for wheezing or shortness of breath.  Marland Kitchen albuterol (VENTOLIN HFA) 108 (90 Base) MCG/ACT inhaler INHALE 2 PUFFS INTO THE LUNGS EVERY 6 HOURS AS NEEDED FOR WHEEZING OR SHORTNESS OF BREATH (Patient taking differently: Inhale 2 puffs into the lungs every 6 (six) hours as needed for wheezing or shortness of breath.)  . apixaban (ELIQUIS) 5 MG TABS tablet Take 5 mg by mouth 2 (two) times daily.  Marland Kitchen atorvastatin (LIPITOR) 10 MG tablet Take 10 mg by mouth every evening.   . BD PEN NEEDLE NANO 2ND GEN 32G X 4 MM MISC SMARTSIG:Injection 4 Times Daily  . blood glucose meter kit and supplies 1 each by Other route 4 (four) times daily. Dispense based on patient and insurance preference. Use up to four times daily as directed. (FOR ICD-10 E10.9, E11.9).  . Budeson-Glycopyrrol-Formoterol (BREZTRI AEROSPHERE) 160-9-4.8 MCG/ACT AERO Inhale 2 puffs into the lungs 2 (two) times daily.  . Cholecalciferol (VITAMIN D3) 125 MCG (5000 UT) CAPS Take 1 capsule (5,000 Units total) by mouth daily.  . Continuous Blood Gluc Receiver (FREESTYLE LIBRE 2 READER) DEVI As directed  .  Continuous Blood Gluc Sensor (FREESTYLE LIBRE 2 SENSOR) MISC 1 Piece by Does not apply route every 14 (fourteen) days.  Marland Kitchen diltiazem (CARDIZEM CD) 360 MG 24 hr capsule Take 1 capsule (360 mg total) by mouth daily.  . divalproex (DEPAKOTE ER) 500 MG 24 hr tablet 500 mg in AM, 1000 mg at night  . Dulaglutide (TRULICITY) 3.82 NK/5.3ZJ SOPN Inject 0.75 mg into the skin once a  week. (Patient taking differently: Inject 0.75 mg into the skin every Sunday.)  . ELDERBERRY PO Take 4 g by mouth daily. Gummies 2 g each  . EPINEPHrine 0.3 mg/0.3 mL IJ SOAJ injection Inject 0.3 mg into the muscle as needed for anaphylaxis.  Marland Kitchen FASENRA PEN 30 MG/ML SOAJ INJECT 30MG SUBCUTANEOUSLY  EVERY 8 WEEKS (GIVEN AT MD  OFFICE) (Patient taking differently: Inject 30 mg into the skin See admin instructions. Every eight weeks)  . ferrous sulfate 324 MG TBEC Take 324 mg by mouth daily with breakfast.  . gabapentin (NEURONTIN) 300 MG capsule Take 300 mg by mouth 3 (three) times daily.   . hydrALAZINE (APRESOLINE) 25 MG tablet Take 25 mg by mouth 3 (three) times daily.  . hydrOXYzine (ATARAX/VISTARIL) 25 MG tablet Take 1 tablet (25 mg total) by mouth every 6 (six) hours as needed for anxiety or nausea.  . Immune Globulin, Human, 4 GM/20ML SOLN Inject 100 mLs into the skin every 14 (fourteen) days.  Marland Kitchen LANTUS SOLOSTAR 100 UNIT/ML Solostar Pen Inject 45 Units into the skin at bedtime.  Marland Kitchen levothyroxine (SYNTHROID, LEVOTHROID) 112 MCG tablet Take 112 mcg by mouth daily before breakfast.  . Magnesium Oxide 400 MG CAPS Take 1 capsule (400 mg total) by mouth daily.  . Melatonin 3 MG TABS Take 3 mg by mouth at bedtime.  . montelukast (SINGULAIR) 10 MG tablet Take 1 tablet by mouth daily.  . Multiple Vitamins-Minerals (MULTIVITAMIN WITH MINERALS) tablet Take 1 tablet by mouth daily. Woman  . Multiple Vitamins-Minerals (MULTIVITAMIN WITH MINERALS) tablet Take 1 tablet by mouth daily. Vita Fusion sugar free 4 gram fiber  . Multiple  Vitamins-Minerals (ZINC PO) Take by mouth.  . Nebulizer MISC Nebulizer tubing kit  . omeprazole (PRILOSEC) 20 MG capsule Take 1 capsule (20 mg total) by mouth 2 (two) times daily before a meal.  . ondansetron (ZOFRAN) 4 MG tablet Take 1 tablet (4 mg total) by mouth every 8 (eight) hours as needed for nausea or vomiting.  Glory Rosebush ULTRA test strip USE TO CHECK BLOOD SUGAR FOUR TIMES DAILY  . Potassium Chloride ER 20 MEQ TBCR Take 1 tablet by mouth daily.  . Tiotropium Bromide Monohydrate (SPIRIVA RESPIMAT) 1.25 MCG/ACT AERS Inhale 2 puffs into the lungs daily. (Patient not taking: Reported on 01/19/2021)  . topiramate (TOPAMAX) 100 MG tablet Take 1 tablet (100 mg total) by mouth 2 (two) times daily.  Marland Kitchen torsemide (DEMADEX) 20 MG tablet Take 2 tablets (40 mg total) by mouth daily. (Patient taking differently: Take 20 mg by mouth daily.)  . vitamin B-12 (CYANOCOBALAMIN) 1000 MCG tablet Take 1,000 mcg by mouth daily.  . vitamin C (ASCORBIC ACID) 500 MG tablet Take 500 mg by mouth daily. Power C immune support  . [DISCONTINUED] HUMALOG KWIKPEN 100 UNIT/ML KwikPen Inject 5 Units into the skin 3 (three) times daily before meals. Sliding scale.   Facility-Administered Encounter Medications as of 02/12/2021  Medication  . Benralizumab SOSY 30 mg    ALLERGIES: Allergies  Allergen Reactions  . Ativan [Lorazepam] Hives  . Phenergan [Promethazine Hcl] Hives    VACCINATION STATUS: Immunization History  Administered Date(s) Administered  . Influenza Split 08/24/2018  . Influenza Whole 07/15/2019  . Influenza,inj,Quad PF,6+ Mos 07/10/2017  . Moderna Sars-Covid-2 Vaccination 12/31/2019, 01/30/2020  . Pneumococcal-Unspecified 10/10/2012    Diabetes She presents for her follow-up diabetic visit. She has type 2 diabetes mellitus. Onset time: She was diagnosed at approximate age of 25 years, stated by gestational diabetes with  all 3 of her pregnancies in her 45s. Her disease course has been improving  (Since her last visit, she fell and injured herself.  CT scan of the head did not show any intracranial bleeding.Marland Kitchen). There are no hypoglycemic associated symptoms. Pertinent negatives for hypoglycemia include no confusion, headaches, pallor or seizures. Associated symptoms include fatigue. Pertinent negatives for diabetes include no blurred vision, no chest pain, no polydipsia, no polyphagia and no polyuria. There are no hypoglycemic complications. Symptoms are improving. Diabetic complications include a CVA. (She has history of open heart surgery for ASD in 1965.) Risk factors for coronary artery disease include dyslipidemia, diabetes mellitus, family history, obesity, hypertension, post-menopausal, sedentary lifestyle and tobacco exposure. Current diabetic treatments: She is currently on Lantus 45 units nightly, Humalog 7 units 3 times daily AC, Metformin 1000 g p.o. twice daily, Trulicity 1.5 mg subcutaneou 50 units nig. Her weight is fluctuating minimally. She is following a generally unhealthy diet. When asked about meal planning, she reported none. She has not had a previous visit with a dietitian. She rarely (She has medical problem with bronchiectasis for which she gets treated with steroids on and off.) participates in exercise. Her home blood glucose trend is decreasing steadily. Her breakfast blood glucose range is generally 110-130 mg/dl. Her lunch blood glucose range is generally 110-130 mg/dl. Her dinner blood glucose range is generally 110-130 mg/dl. Her bedtime blood glucose range is generally 110-130 mg/dl. Her overall blood glucose range is 110-130 mg/dl. (She has recently obtained coverage for a CGM-freestyle libre.  She presents with controlled glycemic profile to all to near target and point-of-care care A1c of 6.2% improving from 7.7%.  She did not have hypoglycemia.  She is 93% time range, 7% slightly above range.  ) An ACE inhibitor/angiotensin II receptor blocker is being taken.   Hyperlipidemia This is a chronic problem. The current episode started more than 1 year ago. Exacerbating diseases include diabetes and obesity. Associated symptoms include myalgias. Pertinent negatives include no chest pain or shortness of breath. Current antihyperlipidemic treatment includes statins. Risk factors for coronary artery disease include dyslipidemia, diabetes mellitus, family history, obesity, hypertension, a sedentary lifestyle and post-menopausal.  Hypertension This is a chronic problem. The current episode started more than 1 year ago. The problem is uncontrolled. Pertinent negatives include no blurred vision, chest pain, headaches, palpitations or shortness of breath. Risk factors for coronary artery disease include dyslipidemia, diabetes mellitus, obesity, sedentary lifestyle and smoking/tobacco exposure. Past treatments include ACE inhibitors. Hypertensive end-organ damage includes CVA.  Other The current episode started more than 1 month ago. The problem occurs constantly. Progression since onset: Patient reports that she did have exposure to steroids related to back pain on 2 separate occasions between November 2000 21 and 2022.  Last exposure was related to her COVID infection.  She reports that she developed fatigueced that she developed fatigue. Associated symptoms include fatigue and myalgias. Pertinent negatives include no arthralgias, chest pain, chills, coughing, fever, headaches, nausea, rash or vomiting. She has tried nothing for the symptoms. The treatment provided no relief.      Objective:    BP (!) 110/56   Pulse 68   Ht 5' 4" (1.626 m)   Wt 199 lb (90.3 kg)   BMI 34.16 kg/m   Wt Readings from Last 3 Encounters:  02/12/21 199 lb (90.3 kg)  01/20/21 202 lb (91.6 kg)  01/19/21 202 lb (91.6 kg)    + Bruise and hematoma on her forehead and left periorbital area.  This is related to her recent fall.  CT scan of the head did not show any intracranial  bleeding.  Has no recent CMP, A1c, thyroid function test, lipid panel to review. Recent Results (from the past 2160 hour(s))  ACTH stimulation, 3 time points     Status: None   Collection Time: 12/03/20  8:33 AM  Result Value Ref Range   Cortisol, Base 7.6 ug/dL    Comment: NO NORMAL RANGE ESTABLISHED FOR THIS TEST   Cortisol, 30 Min 23.4 ug/dL   Cortisol, 60 Min 27.5 ug/dL    Comment: Performed at [facility] [address], [place], [place] [address]  Basic metabolic panel     Status: Abnormal   Collection Time: 12/07/20  1:55 PM  Result Value Ref Range   Sodium 139 135 - 145 mmol/L   Potassium 3.6 3.5 - 5.1 mmol/L   Chloride 108 98 - 111 mmol/L   CO2 21 (L) 22 - 32 mmol/L   Glucose, Bld 70 70 - 99 mg/dL    Comment: Glucose reference range applies only to samples taken after fasting for at least 8 hours.   BUN 13 6 - 20 mg/dL   Creatinine, Ser 0.64 0.44 - 1.00 mg/dL   Calcium 9.0 8.9 - 10.3 mg/dL   GFR, Estimated >60 >60 mL/min    Comment: (NOTE) Calculated using the CKD-EPI Creatinine Equation (2021)    Anion gap 10 5 - 15    Comment: Performed at [facility] [address] [place], [place] [address]  Protime-INR     Status: None   Collection Time: 12/07/20  1:55 PM  Result Value Ref Range   Prothrombin Time 13.8 11.4 - 15.2 seconds   INR 1.1 0.8 - 1.2    Comment: (NOTE) INR goal varies based on device and disease states. Performed at [facility], [place] [address], [place], [place] [address]   CBC     Status: None   Collection Time: 12/07/20  1:55 PM  Result Value Ref Range   WBC 5.9 4.0 - 10.5 K/uL   RBC 3.91 3.87 - 5.11 MIL/uL   Hemoglobin 12.8 12.0 - 15.0 g/dL   HCT 39.1 36.0 - 46.0 %   MCV 100.0 80.0 - 100.0 fL   MCH 32.7 26.0 - 34.0 pg   MCHC 32.7 30.0 - 36.0 g/dL   RDW 13.9 11.5 - 15.5 %   Platelets 182 150 - 400 K/uL   nRBC 0.0 0.0 - 0.2 %    Comment: Performed at [facility], [place] [address], [place], [place] [address]  Type  and screen [place]     Status: None   Collection Time: 12/07/20  1:55 PM  Result Value Ref Range   ABO/RH(D) O NEG    Antibody Screen NEG    Sample Expiration      12/10/2020,2359 Performed at [facility], [place] [address] [place], [place] [address]   Urinalysis, Routine w reflex microscopic     Status: Abnormal   Collection Time: 12/08/20  1:15 PM  Result Value Ref Range   Color, Urine COLORLESS (A) YELLOW   APPearance CLEAR CLEAR   Specific Gravity, Urine 1.006 1.005 - 1.030   pH 5.0 5.0 - 8.0   Glucose, UA NEGATIVE NEGATIVE mg/dL   Hgb urine dipstick NEGATIVE NEGATIVE   Bilirubin Urine NEGATIVE NEGATIVE   Ketones, ur NEGATIVE NEGATIVE mg/dL   Protein, ur NEGATIVE NEGATIVE  mg/dL   Nitrite NEGATIVE NEGATIVE   Leukocytes,Ua NEGATIVE NEGATIVE    Comment: Performed at [facility], [address] [place], [place] [address]  Culture, Urine     Status: None   Collection Time: 12/08/20  1:16 PM   Specimen: Urine, Clean Catch  Result Value Ref Range   Specimen Description      URINE, CLEAN CATCH Performed at [facility], [address] [place], [place] [address]    Special Requests      NONE Performed at [facility], [address] [place], [place] [address]    Culture      NO GROWTH Performed at [facility], [place] [address], [place], [place] [address]    Report Status 12/09/2020 FINAL   HM DIABETES EYE EXAM     Status: Abnormal   Collection Time: 12/16/20 12:00 AM  Result Value Ref Range   HM Diabetic Eye Exam Retinopathy (A) No Retinopathy    Comment: Nonproliferative diabetic retinopathy OU improved.  SARS CORONAVIRUS 2 (TAT 6-24 HRS) Nasopharyngeal Nasopharyngeal Swab     Status: None   Collection Time: 01/25/21  6:18 AM   Specimen: Nasopharyngeal Swab  Result Value Ref Range   SARS Coronavirus 2 NEGATIVE NEGATIVE    Comment: (NOTE) SARS-CoV-2 target nucleic acids are NOT DETECTED.  The SARS-CoV-2 RNA is generally detectable in  upper and lower respiratory specimens during the acute phase of infection. Negative results do not preclude SARS-CoV-2 infection, do not rule out co-infections with other pathogens, and should not be used as the sole basis for treatment or other patient management decisions. Negative results must be combined with clinical observations, patient history, and epidemiological information. The expected result is Negative.  Fact Sheet for Patients: SugarRoll.be  Fact Sheet for Healthcare Providers: https://www.woods-mathews.com/  This test is not yet approved or cleared by the [place] FDA and  has been authorized for detection and/or diagnosis of SARS-CoV-2 by FDA under an Emergency Use Authorization (EUA). This EUA will remain  in effect (meaning this test can be used) for the duration of the COVID-19 declaration under Se ction 564(b)(1) of the Act, 21 U.S.C. section 360bbb-3(b)(1), unless the authorization is terminated or revoked sooner.  Performed at [facility], [place] [address] [place], [place] [address]   Glucose, capillary     Status: Abnormal   Collection Time: 01/26/21  8:33 AM  Result Value Ref Range   Glucose-Capillary 129 (H) 70 - 99 mg/dL    Comment: Glucose reference range applies only to samples taken after fasting for at least 8 hours.  Surgical pathology     Status: None   Collection Time: 01/26/21 10:10 AM  Result Value Ref Range   SURGICAL PATHOLOGY      SURGICAL PATHOLOGY CASE: [id number] PATIENT: Atiana Skora Surgical Pathology Report     Clinical History: Abd pain, nauseas (crm)     FINAL MICROSCOPIC DIAGNOSIS:  A. STOMACH, BIOPSY: - Antral mucosa with hyperemia and changes consistent with reactive gastropathy. - Warthin-Starry negative for Helicobacter pylori. - No intestinal metaplasia, dysplasia or carcinoma.  B. COLON, ASCENDING, POLYPECTOMY: - Tubular adenoma (s). - No high-grade  dysplasia or carcinoma.    GROSS DESCRIPTION:  A: Received in formalin are tan, soft tissue fragments that are submitted in toto. Number: 4 size: Ranging from 0.2 to 0.4 cm blocks: 1  B: Received in formalin are tan, soft tissue fragments that are submitted in toto. Number: Multiple size: Measuring 0.1 to 1.0 x  0.3 x 0.2 cm blocks: 1 Craig Staggers 01/26/2021)    Final Diagnosis performed by Claudette Laws, MD.   Electronically signed 01/27/2021 Technical component performed at [facility], [place] [address] [place], [place] [address].  Professional component performed at [facility], [place] [address], [place], [place] [address].  Immunohistochemistry Technical component (if applicable) was performed at [facility]. [address] [place], [place],  [address].   IMMUNOHISTOCHEMISTRY DISCLAIMER (if applicable): Some of these immunohistochemical stains may have been developed and the performance characteristics determine by [facility]. Some may not have been cleared or approved by the U.S. Food and Drug Administration. The FDA has determined that such clearance or approval is not necessary. This test is used for clinical purposes. It should not be regarded as investigational or for research. This laboratory is certified under the [place] (CLIA-88) as qualified to perform high complexity clinical laboratory testing.  The controls stained appropriately.   Glucose, capillary     Status: None   Collection Time: 01/26/21 10:49 AM  Result Value Ref Range   Glucose-Capillary 97 70 - 99 mg/dL    Comment: Glucose reference range applies only to samples taken after fasting for at least 8 hours.  HgB A1c     Status: None   Collection Time: 02/12/21 10:37 AM  Result Value Ref Range   Hemoglobin A1C     HbA1c POC (<> result, manual entry)     HbA1c, POC (prediabetic range)      HbA1c, POC (controlled diabetic range) 6.2 0.0 - 7.0 %     Assessment & Plan:     1.  Fatigue: Likely multifactorial including sleep apnea.  In relation to her recent steroid exposure, she was assessed for adrenal sufficiency.  Her ACTH stimulation test is consistent with appropriate response, rules out adrenal insufficiency.   She would not need intervention with steroids for now.  2. DM type 2 causing vascular disease ([place]) She has recently obtained coverage for a CGM-freestyle libre.  She presents with controlled glycemic profile to all to near target and point-of-care care A1c of 6.2% improving from 7.7%.  She did not have hypoglycemia.  She is 93% time range, 7% slightly above range.   -her diabetes is complicated by CVA, history of open heart surgery for ASD, obesity/sedentary life, history of chronic smoking with bronchiectasis and Laura [name] remains at a high risk for more acute and chronic complications which include CAD, CVA, CKD, retinopathy, and neuropathy. These are all discussed in detail with the patient.  - I have counseled her on diet management and weight loss, by adopting a carbohydrate restricted/protein rich diet.  - she acknowledges that there is a room for improvement in her food and drink choices. - Suggestion is made for her to avoid simple carbohydrates  from her diet including Cakes, Sweet Desserts, Ice Cream, Soda (diet and regular), Sweet Tea, Candies, Chips, Cookies, Store Bought Juices, Alcohol in Excess of  1-2 drinks a day, Artificial Sweeteners,  Coffee Creamer, and "Sugar-free" Products, Lemonade. This will help patient to have more stable blood glucose profile and potentially avoid unintended weight gain.   - she is advised to stick to a routine mealtimes to eat 3 meals  a day and avoid unnecessary snacks ( to snack only to correct hypoglycemia).   - she will be scheduled with Jearld Fenton, RDN, CDE for individualized diabetes education.  - I have  approached  her with the following individualized plan to manage diabetes and patient agrees:   -She is promising to continue on her significant change in her diet and exercise regimen as much as possible.   -In light of her presentation with tightly controlled glycemic profile, she will be taken off of Humalog for now.   -She is advised to stay on Lantus 45 units nightly associated with monitoring of blood glucose 4 times a day-daily before breakfast, lunch, supper, and at bedtime, and at any other time as needed using her CGM. - Patient is warned not to take insulin without proper monitoring per orders.  -Patient is encouraged to call clinic for blood glucose levels less than 70 or above 200 mg /dl. --Due to her vague GI concerns, she is advised to stay off of Metformin for now.  -She is tolerating a lower dose of Trulicity, currently 9.98 mg Septanest weekly.  Advised to continue.  - Patient specific target  A1c;  LDL, HDL, Triglycerides,  were discussed in detail.  3) BP/HTN: Her blood pressure is controlled to target.  She advised to continue her current blood pressure medications including hydralazine 25 mg 3 times daily.     4) Lipids/HPL: Her recent lipid panel showed LDL of 76, patient is on atorvastatin 10 mg p.o. nightly.  Advised to continue, side effects precautions discussed with her.     5)  Weight/Diet: Her BMI is 34.1, patient losing weight.   -She is working with her dietitian.     6) hypothyroidism-long-standing, etiology unclear, she has multiple family members with thyroid dysfunctions. -She does not have recent thyroid function test will be considered for next year.  She is advised to continue levothyroxine 112 mcg p.o. daily before breakfast.     - We discussed about the correct intake of her thyroid hormone, on empty stomach at fasting, with water, separated by at least 30 minutes from breakfast and other medications,  and separated by more than 4 hours from calcium,  iron, multivitamins, acid reflux medications (PPIs). -Patient is made aware of the fact that thyroid hormone replacement is needed for life, dose to be adjusted by periodic monitoring of thyroid function tests.    7) vitamin D deficiency: Her vitamin D 16.5.  She is currently on  vitamin D supplements including vitamin D3 5000 units daily.   8) Chronic Care/Health Maintenance:  -she  is on  Statin medications and  is encouraged to continue to follow up with Ophthalmology, Dentist,  Podiatrist at least yearly or according to recommendations, and advised to  stay away from smoking. I have recommended yearly flu vaccine and pneumonia vaccination at least every 5 years;  and  sleep for at least 7 hours a day.  - I advised patient to maintain close follow up with [name], MD for primary care needs.    I spent 45 minutes in the care of the patient today including review of labs from [facility], Lipids, Thyroid Function, Hematology (current and previous including abstractions from other facilities); face-to-face time discussing  her blood glucose readings/logs, discussing hypoglycemia and hyperglycemia episodes and symptoms, medications doses, her options of short and long term treatment based on the latest standards of care / guidelines;  discussion about incorporating lifestyle medicine;  and documenting the encounter.    Please refer to Patient Instructions for Blood Glucose Monitoring and Insulin/Medications Dosing Guide"  in media tab for additional information. Please  also refer to " Patient Self Inventory" in the Media  tab for reviewed elements of pertinent patient history.  Laura [name] participated in the discussions, expressed understanding, and voiced agreement with the above plans.  All questions were answered to her satisfaction. she is encouraged to contact clinic should she have any questions or concerns prior to her return visit.   Follow up plan: - Return in about 3 months  (around 05/15/2021) for F/U with Pre-visit Labs, Meter, Logs, A1c here.Laura Lloyd, MD [facility] [address] [place], [place] [address] Phone: [phone]  Fax: [phone]    02/12/2021, 1:02 PM  This note was partially dictated with voice recognition software. Similar sounding words can be transcribed inadequately or may not  be corrected upon review.

## 2021-02-12 NOTE — Patient Instructions (Signed)
                                     Advice for Weight Management  -For most of us the best way to lose weight is by diet management. Generally speaking, diet management means consuming less calories intentionally which over time brings about progressive weight loss.  This can be achieved more effectively by restricting carbohydrate consumption to the minimum possible.  So, it is critically important to know your numbers: how much calorie you are consuming and how much calorie you need. More importantly, our carbohydrates sources should be unprocessed or minimally processed complex starch food items.   Sometimes, it is important to balance nutrition by increasing protein intake (animal or plant source), fruits, and vegetables.  -Sticking to a routine mealtime to eat 3 meals a day and avoiding unnecessary snacks is shown to have a big role in weight control. Under normal circumstances, the only time we lose real weight is when we are hungry, so allow hunger to take place- hunger means no food between meal times, only water.  It is not advisable to starve.   -It is better to avoid simple carbohydrates including: Cakes, Sweet Desserts, Ice Cream, Soda (diet and regular), Sweet Tea, Candies, Chips, Cookies, Store Bought Juices, Alcohol in Excess of  1-2 drinks a day, Lemonade,  Artificial Sweeteners, Doughnuts, Coffee Creamers, "Sugar-free" Products, etc, etc.  This is not a complete list.....    -Consulting with certified diabetes educators is proven to provide you with the most accurate and current information on diet.  Also, you may be  interested in discussing diet options/exchanges , we can schedule a visit with Laura [name], RDN, CDE for individualized nutrition education.  -Exercise: If you are able: 30 -60 minutes a day ,4 days a week, or 150 minutes a week.  The longer the better.  Combine stretch, strength, and aerobic activities.  If you were told in the  past that you have high risk for cardiovascular diseases, you may seek evaluation by your heart doctor prior to initiating moderate to intense exercise programs.                                  Additional Care Considerations for Diabetes   -Diabetes  is a chronic disease.  The most important care consideration is regular follow-up with your diabetes care provider with the goal being avoiding or delaying its complications and to take advantage of advances in medications and technology.    -Type 2 diabetes is known to coexist with other important comorbidities such as high blood pressure and high cholesterol.  It is critical to control not only the diabetes but also the high blood pressure and high cholesterol to minimize and delay the risk of complications including coronary artery disease, stroke, amputations, blindness, etc.    - Studies showed that people with diabetes will benefit from a class of medications known as ACE inhibitors and statins.  Unless there are specific reasons not to be on these medications, the standard of care is to consider getting one from these groups of medications at an optimal doses.  These medications are generally considered safe and proven to help protect the heart and the kidneys.    - People with diabetes are encouraged to initiate and maintain regular follow-up with eye doctors, foot   doctors, dentists , and if necessary heart and kidney doctors.     - It is highly recommended that people with diabetes quit smoking or stay away from smoking, and get yearly  flu vaccine and pneumonia vaccine at least every 5 years.  One other important lifestyle recommendation is to ensure adequate sleep - at least 6-7 hours of uninterrupted sleep at night.  -Exercise: If you are able: 30 -60 minutes a day, 4 days a week, or 150 minutes a week.  The longer the better.  Combine stretch, strength, and aerobic activities.  If you were told in the past that you have high risk for  cardiovascular diseases, you may seek evaluation by your heart doctor prior to initiating moderate to intense exercise programs.          

## 2021-02-15 ENCOUNTER — Ambulatory Visit (HOSPITAL_COMMUNITY): Payer: Medicare Other | Admitting: Physical Therapy

## 2021-02-16 ENCOUNTER — Ambulatory Visit (HOSPITAL_COMMUNITY): Payer: Medicare Other | Admitting: Physical Therapy

## 2021-02-16 ENCOUNTER — Encounter (HOSPITAL_COMMUNITY): Payer: Self-pay | Admitting: Physical Therapy

## 2021-02-16 ENCOUNTER — Other Ambulatory Visit: Payer: Self-pay

## 2021-02-16 DIAGNOSIS — M25552 Pain in left hip: Secondary | ICD-10-CM | POA: Diagnosis not present

## 2021-02-16 DIAGNOSIS — M79605 Pain in left leg: Secondary | ICD-10-CM

## 2021-02-16 DIAGNOSIS — M545 Low back pain, unspecified: Secondary | ICD-10-CM | POA: Diagnosis not present

## 2021-02-16 DIAGNOSIS — E1159 Type 2 diabetes mellitus with other circulatory complications: Secondary | ICD-10-CM | POA: Diagnosis not present

## 2021-02-16 DIAGNOSIS — R5383 Other fatigue: Secondary | ICD-10-CM | POA: Diagnosis not present

## 2021-02-16 DIAGNOSIS — M542 Cervicalgia: Secondary | ICD-10-CM | POA: Diagnosis not present

## 2021-02-16 NOTE — Therapy (Signed)
[place], [place], [address] Phone: [phone]   Fax:  [phone]  Physical Therapy Treatment  Patient Details  Name: Laura [name] MRN: [id number] Date of Birth: [birth year] Referring Provider (PT): Matthew Saras [place]   Encounter Date: 02/16/2021   PT End of Session - 02/16/21 1443    Visit Number 18    Number of Visits 24    Date for PT Re-Evaluation 03/03/21    Authorization Type Medicare A, Medcaid 2nd    Progress Note Due on Visit 24    PT Start Time 1445    PT Stop Time 1525    PT Time Calculation (min) 40 min    Activity Tolerance Patient tolerated treatment well    Behavior During Therapy [facility] for tasks assessed/performed           Past Medical History:  Diagnosis Date  . Asthma   . Atrial fibrillation ([place])   . Bipolar disorder ([place])   . Bronchiectasis ([place])   . Carpal tunnel syndrome of right wrist   . CHF (congestive heart failure) ([place])   . Complication of anesthesia    "my Dr in [place] told me I had an allergic reaction to the combination of the pain meds and the anesthesia" she does not remember what happened but "I eneded up in the ICU for 5 days".  . H/O congenital atrial septal defect (ASD) repair   . Hypogammaglobulinemia ([place])   . Hypothyroid   . 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 Neuropathy   . Neuropathy   . Osteoarthritis   . Pinched nerve in neck   . PTSD (post-traumatic stress disorder)   . Recurrent sinusitis   . Short-term memory loss   . Sleep apnea   . TIA (transient ischemic attack)   . Tremors of nervous system    seeing PCP for this.  . Type 2 diabetes mellitus ([place])   . Urticaria   . Wound infection     Past Surgical History:  Procedure Laterality Date  . 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 BIOPSY  01/26/2021   Procedure: BIOPSY;  Surgeon: [name], DO;  [facility];  Service: Endoscopy;;  gastric  . CARDIAC SURGERY    . CARDIOVERSION    .  CARDIOVERSION N/A 08/29/2018   Procedure: CARDIOVERSION;  Surgeon: Arnoldo Lenis, MD;  [facility];  Service: Endoscopy;  Laterality: N/A;  . [place]  . COLONOSCOPY WITH PROPOFOL N/A 01/26/2021   Procedure: COLONOSCOPY WITH PROPOFOL;  Surgeon: [name], DO;  [facility];  Service: Endoscopy;  Laterality: N/A;  am appt, diabetic  . ESOPHAGOGASTRODUODENOSCOPY (EGD) WITH PROPOFOL N/A 01/26/2021   Procedure: ESOPHAGOGASTRODUODENOSCOPY (EGD) WITH PROPOFOL;  Surgeon: [name], DO;  [facility];  Service: Endoscopy;  Laterality: N/A;  . LEFT HEART CATH AND CORONARY ANGIOGRAPHY N/A 08/30/2019   Procedure: LEFT HEART CATH AND CORONARY ANGIOGRAPHY;  Surgeon: [name], MD;  Location: [facility];  Service: Cardiovascular;  Laterality: N/A;  . NASAL SINUS SURGERY  2017  . POLYPECTOMY  01/26/2021   Procedure: POLYPECTOMY;  Surgeon: [name], DO;  [facility];  Service: Endoscopy;;  colon    There were no vitals filed for this visit.   Subjective Assessment - 02/16/21 1451    Subjective States she is tired. States her pain is about 2/10 pain in her neck.  States that the exercises are going well and trying to be consistent as much as possible. States that her splint is not comfortable but her MD wants her to wear it until she decides what she wants to do. States with her back she is considering buying a pool or joining the [employer]. States she is still having difficulty looking to the right and difficulty raising her left arm.    Pertinent History DB, PTSD, SHF, BBipolar, tremors, depression    Limitations Sitting;Standing;Walking;House hold activities;Lifting    Diagnostic tests xrays    Patient Stated Goals to be able to walk a mile    Currently in Pain? Yes    Pain Score 3     Pain Location Neck    Pain Orientation Posterior    Pain Descriptors / Indicators Aching;Tightness    Pain  Onset More than a month ago    Pain Onset More than a month ago              [facility] PT Assessment - 02/16/21 0001      Assessment   Medical Diagnosis neck pain with radiculopathy and low back pain    Referring Provider (PT) Matthew Saras PA                         [facility] Adult PT Treatment/Exercise - 02/16/21 0001      Neck Exercises: Standing   Other Standing Exercises wall slides 2x10 5"; thoracic extension with towel roll down spine at wall - 6 minutes - felt good - then added shoulder ER 3x10 5" holds; pec stretch at wall x4 10" holds B    Other Standing Exercises W's with posterior support and towel roll down spine 5x5 5" holds      Neck Exercises: Seated   Shoulder Flexion 5 reps   4 sets with dowel   Other Seated Exercise shoulder abd with dowel - not tolerated - stopped                  PT Education - 02/16/21 1522    Education Details on HEP, on questions about current condition    Person(s) Educated Patient    Methods Explanation    Comprehension Verbalized understanding            PT Short Term Goals - 02/03/21 0924      PT SHORT TERM GOAL #1   Title Patient will be independent with initial HEP and self-management strategies to improve functional outcomes    Baseline Reports compliance, demos good return    Time 3    Period Weeks    Status Achieved    Target Date 12/24/20      PT SHORT TERM GOAL #2   Title Patient will be able to demonstrate at least 150 degrees of left active shoulder ROM while sitting to demosntrate improved shoulder mobility.    Baseline Current 130    Time 3    Period Weeks    Status On-going    Target Date 12/24/20      PT SHORT TERM GOAL #3   Title Patient will report at least 25% improvement in overall symptoms and/or function to demonstrate improved functional mobility    Baseline Reports 50%    Status Achieved             PT Long Term Goals - 02/03/21 0925      PT LONG TERM GOAL #1    Title  Patient will improve on FOTO score to meet predicted outcomes to demonstrate improved functional mobility.    Baseline Lumbar score achieved, cervical score ongoing    Time 6    Period Weeks    Status On-going      PT LONG TERM GOAL #2   Title Patient will report at least 50% overall improvement in subjective complaint to indicate improvement in ability to perform ADLs.    Baseline Reports 50%    Time 6    Period Weeks    Status Achieved      PT LONG TERM GOAL #3   Title Patient will be able to walk a mile to demonstrate improved walking endurance    Baseline Patient reports she is now able to do this    Time 6    Period Weeks    Status Achieved                 Plan - 02/16/21 1522    Clinical Impression Statement Did not tolerate shoulder exercises above head today but did tolerate thoracic extension at wall with shoulder external rotation. Patient with general fatigue noted today. Overall minimal change in symptoms noted. Will continue with current POC.    Personal Factors and Comorbidities Comorbidity 3+;Comorbidity 2;Comorbidity 1    Comorbidities COPD, Asthma, HTN, A-fib, DM    Stability/Clinical Decision Making Evolving/Moderate complexity    Rehab Potential Good    PT Frequency 2x / week    PT Duration 4 weeks    PT Treatment/Interventions ADLs/Self Care Home Management;Aquatic Therapy;Biofeedback;Canalith Repostioning;Cryotherapy;Electrical Stimulation;Iontophoresis 4mg /ml Dexamethasone;Moist Heat;Traction;Balance training;Manual lymph drainage;Manual techniques;Vestibular;Vasopneumatic Device;Taping;Splinting;Orthotic Fit/Training;Energy conservation;Stair training;Functional mobility training;Therapeutic activities;Therapeutic exercise;Gait training;DME Instruction;Patient/family education;Passive range of motion;Dry needling;Joint Manipulations;Spinal Manipulations;Compression bandaging;Visual/perceptual remediation/compensation;Scar mobilization;Neuromuscular  re-education;Ultrasound;Parrafin;Fluidtherapy;Contrast Bath    PT Next Visit Plan Continue postural strengthening. Focus on LT shoulder mobility. Add scapular depression    PT Home Exercise Plan lumbar extension standing; 12/14/20: tandem stance and marching 3/17 scapular retraction, UT stretching, cervical AROM 3/24 shoulder adduction stretch 4/14 band rows 5/5/ wall slides; 5/10 pec stretch    Consulted and Agree with Plan of Care Patient           Patient will benefit from skilled therapeutic intervention in order to improve the following deficits and impairments:  Abnormal gait,Impaired flexibility,Postural dysfunction,Improper body mechanics,Decreased range of motion,Decreased balance,Decreased mobility,Difficulty walking,Pain,Increased fascial restricitons,Decreased strength,Decreased activity tolerance,Hypomobility  Visit Diagnosis: Cervicalgia  Low back pain, unspecified back pain laterality, unspecified chronicity, unspecified whether sciatica present  Pain in left leg  Pain in left hip     Problem List Patient Active Problem List   Diagnosis Date Noted  . Adnexal cyst 01/19/2021  . Chronic RLQ pain 01/19/2021  . Mixed stress and urge urinary incontinence 01/19/2021  . Other fatigue 12/10/2020  . Abdominal pain, chronic, right lower quadrant 12/08/2020  . Abnormal weight loss 12/08/2020  . Poor appetite 12/08/2020  . Abnormal EKG   . Chronic diastolic (congestive) heart failure ([place]) 08/29/2019  . Shortness of breath 08/29/2019  . Bipolar disorder ([place]) 08/10/2018  . Headache 08/10/2018  . H/O atrial flutter 08/10/2018  . OSA on CPAP 08/10/2018  . Anxiety 08/10/2018  . Uncontrolled type 2 diabetes mellitus with hyperglycemia ([place]) 03/13/2018  . Hypothyroidism 03/13/2018  . Essential hypertension, benign 03/13/2018  . Mixed hyperlipidemia 03/13/2018  . Hypogammaglobulinemia ([place]) 02/06/2018  . Non-allergic rhinitis 02/06/2018  . Severe persistent asthma,  uncomplicated 73/53/2992  . Pulmonary nodules 02/06/2018  . Bronchiectasis without complication ([place]) 42/68/3419  .  DM type 2 causing vascular disease ([place]) 02/06/2018    3:25 PM, 02/16/21 Jerene Pitch, DPT Physical Therapy with [facility]  [phone] office  [place] [address] [place], [place], [address] Phone: [phone]   Fax:  [phone]  Name: Laura [name] MRN: [id number] Date of Birth: Aug 05, 1961

## 2021-02-18 ENCOUNTER — Encounter (HOSPITAL_COMMUNITY): Payer: Self-pay | Admitting: Physical Therapy

## 2021-02-18 ENCOUNTER — Encounter: Payer: Medicare Other | Admitting: Nutrition

## 2021-02-18 ENCOUNTER — Ambulatory Visit (HOSPITAL_COMMUNITY): Payer: Medicare Other | Admitting: Physical Therapy

## 2021-02-18 ENCOUNTER — Other Ambulatory Visit: Payer: Self-pay

## 2021-02-18 ENCOUNTER — Encounter: Payer: Self-pay | Admitting: Nutrition

## 2021-02-18 VITALS — Ht 64.0 in | Wt 201.0 lb

## 2021-02-18 DIAGNOSIS — E1159 Type 2 diabetes mellitus with other circulatory complications: Secondary | ICD-10-CM | POA: Diagnosis not present

## 2021-02-18 DIAGNOSIS — E782 Mixed hyperlipidemia: Secondary | ICD-10-CM | POA: Insufficient documentation

## 2021-02-18 DIAGNOSIS — M542 Cervicalgia: Secondary | ICD-10-CM | POA: Diagnosis not present

## 2021-02-18 DIAGNOSIS — I10 Essential (primary) hypertension: Secondary | ICD-10-CM | POA: Insufficient documentation

## 2021-02-18 DIAGNOSIS — M545 Low back pain, unspecified: Secondary | ICD-10-CM

## 2021-02-18 DIAGNOSIS — E1165 Type 2 diabetes mellitus with hyperglycemia: Secondary | ICD-10-CM

## 2021-02-18 DIAGNOSIS — M25552 Pain in left hip: Secondary | ICD-10-CM | POA: Diagnosis not present

## 2021-02-18 DIAGNOSIS — M79605 Pain in left leg: Secondary | ICD-10-CM | POA: Diagnosis not present

## 2021-02-18 DIAGNOSIS — E669 Obesity, unspecified: Secondary | ICD-10-CM | POA: Insufficient documentation

## 2021-02-18 DIAGNOSIS — R5383 Other fatigue: Secondary | ICD-10-CM | POA: Diagnosis not present

## 2021-02-18 NOTE — Patient Instructions (Signed)
Goals  Reduce to Lantus 40 units at night. Eat 30 g CHO at meals. Keep walking to 150 minutes a week Get a [place]

## 2021-02-18 NOTE — Therapy (Addendum)
[facility] [address] [place], [place], [address] Phone: [phone]   Fax:  [phone]  Physical Therapy Treatment and Discharge note  Patient Details  Name: Laura [name] MRN: [id number] Date of Birth: 03-04-1961 Referring Provider (PT): Kirstin Shepperson PA  PHYSICAL THERAPY DISCHARGE SUMMARY  Visits from Start of Care: 19  Current functional level related to goals / functional outcomes: See below   Remaining deficits: Continued pain   Education / Equipment: See below  Plan: Patient agrees to discharge.  Patient goals were partially met. Patient is being discharged due to                                                     ?????   being independent in HEP.  .     Encounter Date: 02/18/2021   PT End of Session - 02/18/21 0916    Visit Number 19    Number of Visits 24    Date for PT Re-Evaluation 03/03/21    Authorization Type Medicare A, Medca[employer] 2nd    Progress Note Due on Visit 24    PT Start Time 0916    PT Stop Time 0947    PT Time Calculation (min) 31 min    Activity Tolerance Patient tolerated treatment well    Behavior During Therapy [facility] for tasks assessed/performed           Past Medical History:  Diagnosis Date  . Asthma   . Atrial fibrillation ([place])   . Bipolar disorder ([place])   . Bronchiectasis ([place])   . Carpal tunnel syndrome of right wrist   . CHF (congestive heart failure) ([place])   . Complication of anesthesia    "my Dr in [place] told me I had an allergic reaction to the combination of the pain meds and the anesthesia" she does not remember what happened but "I eneded up in the ICU for 5 days".  . H/O congenital atrial septal defect (ASD) repair   . Hypogammaglobulinemia ([place])   . Hypothyroid   . 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 Neuropathy   . Neuropathy   . Osteoarthritis   . Pinched nerve in neck   . PTSD (post-traumatic stress disorder)   . Recurrent sinusitis   . Short-term memory loss    . Sleep apnea   . TIA (transient ischemic attack)   . Tremors of nervous system    seeing PCP for this.  . Type 2 diabetes mellitus ([place])   . Urticaria   . Wound infection     Past Surgical History:  Procedure Laterality Date  . 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 BIOPSY  01/26/2021   Procedure: BIOPSY;  Surgeon: [name], DO;  [facility];  Service: Endoscopy;;  gastric  . CARDIAC SURGERY    . CARDIOVERSION    . CARDIOVERSION N/A 08/29/2018   Procedure: CARDIOVERSION;  Surgeon: Arnoldo Lenis, MD;  [facility];  Service: Endoscopy;  Laterality: N/A;  . [place]  . COLONOSCOPY WITH PROPOFOL N/A 01/26/2021   Procedure: COLONOSCOPY WITH PROPOFOL;  Surgeon: [name], DO;  [facility];  Service: Endoscopy;  Laterality: N/A;  am appt, diabetic  . ESOPHAGOGASTRODUODENOSCOPY (EGD) WITH PROPOFOL N/A 01/26/2021   Procedure: ESOPHAGOGASTRODUODENOSCOPY (EGD) WITH PROPOFOL;  Surgeon: [name], DO;  [facility];  Service: Endoscopy;  Laterality: N/A;  . LEFT HEART CATH AND CORONARY ANGIOGRAPHY N/A 08/30/2019   Procedure: LEFT HEART CATH AND CORONARY ANGIOGRAPHY;  Surgeon: [name], MD;  Location: [facility] CV LAB;  Service: Cardiovascular;  Laterality: N/A;  . NASAL SINUS SURGERY  2017  . POLYPECTOMY  01/26/2021   Procedure: POLYPECTOMY;  Surgeon: [name], DO;  [facility];  Service: Endoscopy;;  colon    There were no vitals filed for this visit.   Subjective Assessment - 02/18/21 0954    Subjective States she was able to reduce nighttime insulin by 5 units. States she is feeling pretty good. States that she feels good all over today but she did have some symptoms in her back after working yesterday. States that she did her stretches and that helped.    Pertinent History DB, PTSD, SHF, BBipolar, tremors, depression    Limitations Sitting;Standing;Walking;House hold  activities;Lifting    Diagnostic tests xrays    Patient Stated Goals to be able to walk a mile    Currently in Pain? Yes    Pain Score 2     Pain Location Back    Pain Orientation Posterior    Pain Descriptors / Indicators Sore    Pain Onset More than a month ago    Pain Onset More than a month ago              [facility] PT Assessment - 02/18/21 0001      Assessment   Medical Diagnosis neck pain with radiculopathy and low back pain    Referring Provider (PT) Kirstin Shepperson PA      AROM   Right Shoulder Flexion 168 Degrees   was 135   Left Shoulder Flexion 145 Degrees   was 130                                PT Education - 02/18/21 0948    Education Details Reviewed HEP and answered all questions. Discussed work life balance, adding aquatics into daily routine and discussed progress made.    Person(s) Educated Patient    Methods Explanation    Comprehension Verbalized understanding            PT Short Term Goals - 02/03/21 0924      PT SHORT TERM GOAL #1   Title Patient will be independent with initial HEP and self-management strategies to improve functional outcomes    Baseline Reports compliance, demos good return    Time 3    Period Weeks    Status Achieved    Target Date 12/24/20      PT SHORT TERM GOAL #2   Title Patient will be able to demonstrate at least 150 degrees of left active shoulder ROM while sitting to demosntrate improved shoulder mobility.    Baseline Current 130    Time 3    Period Weeks    Status On-going    Target Date 12/24/20      PT SHORT TERM GOAL #3   Title Patient will report at least 25% improvement in overall symptoms and/or function to demonstrate improved functional mobility    Baseline Reports 50%    Status Achieved             PT Long Term Goals - 02/03/21 0925      PT LONG  TERM GOAL #1   Title Patient will improve on FOTO score to meet predicted outcomes to demonstrate improved functional  mobility.    Baseline Lumbar score achieved, cervical score ongoing    Time 6    Period Weeks    Status On-going      PT LONG TERM GOAL #2   Title Patient will report at least 50% overall improvement in subjective complaint to indicate improvement in ability to perform ADLs.    Baseline Reports 50%    Time 6    Period Weeks    Status Achieved      PT LONG TERM GOAL #3   Title Patient will be able to walk a mile to demonstrate improved walking endurance    Baseline Patient reports she is now able to do this    Time 6    Period Weeks    Status Achieved                 Plan - 02/18/21 0950    Clinical Impression Statement Patient has met 2/3 short and long term goals at this time. Discussed progress made with function, continued pain with ongoing work stresses and incorporating aquatics into her daily routine. Answered all questions and patient agreed she knows what she needs to do, she just needs to keep doing it. Patient to discharge from PT to HEP at this time.    Personal Factors and Comorbidities Comorbidity 3+;Comorbidity 2;Comorbidity 1    Comorbidities COPD, Asthma, HTN, A-fib, DM    Stability/Clinical Decision Making Evolving/Moderate complexity    Rehab Potential Good    PT Frequency 2x / week    PT Duration 4 weeks    PT Treatment/Interventions ADLs/Self Care Home Management;Aquatic Therapy;Biofeedback;Canalith Repostioning;Cryotherapy;Electrical Stimulation;Iontophoresis 4mg /ml Dexamethasone;Moist Heat;Traction;Balance training;Manual lymph drainage;Manual techniques;Vestibular;Vasopneumatic Device;Taping;Splinting;Orthotic Fit/Training;Energy conservation;Stair training;Functional mobility training;Therapeutic activities;Therapeutic exercise;Gait training;DME Instruction;Patient/family education;Passive range of motion;Dry needling;Joint Manipulations;Spinal Manipulations;Compression bandaging;Visual/perceptual remediation/compensation;Scar mobilization;Neuromuscular  re-education;Ultrasound;Parrafin;Fluidtherapy;Contrast Bath    PT Next Visit Plan DC to HEP    PT Home Exercise Plan lumbar extension standing; 12/14/20: tandem stance and marching 3/17 scapular retraction, UT stretching, cervical AROM 3/24 shoulder adduction stretch 4/14 band rows 5/5/ wall slides; 5/10 pec stretch    Consulted and Agree with Plan of Care Patient           Patient will benefit from skilled therapeutic intervention in order to improve the following deficits and impairments:  Abnormal gait,Impaired flexibility,Postural dysfunction,Improper body mechanics,Decreased range of motion,Decreased balance,Decreased mobility,Difficulty walking,Pain,Increased fascial restricitons,Decreased strength,Decreased activity tolerance,Hypomobility  Visit Diagnosis: Cervicalgia  Low back pain, unspecified back pain laterality, unspecified chronicity, unspecified whether sciatica present  Pain in left leg  Pain in left hip     Problem List Patient Active Problem List   Diagnosis Date Noted  . Adnexal cyst 01/19/2021  . Chronic RLQ pain 01/19/2021  . Mixed stress and urge urinary incontinence 01/19/2021  . Other fatigue 12/10/2020  . Abdominal pain, chronic, right lower quadrant 12/08/2020  . Abnormal weight loss 12/08/2020  . Poor appetite 12/08/2020  . Abnormal EKG   . Chronic diastolic (congestive) heart failure ([place]) 08/29/2019  . Shortness of breath 08/29/2019  . Bipolar disorder ([place]) 08/10/2018  . Headache 08/10/2018  . H/O atrial flutter 08/10/2018  . OSA on CPAP 08/10/2018  . Anxiety 08/10/2018  . Uncontrolled type 2 diabetes mellitus with hyperglycemia ([place]) 03/13/2018  . Hypothyroidism 03/13/2018  . Essential hypertension, benign 03/13/2018  . Mixed hyperlipidemia 03/13/2018  . Hypogammaglobulinemia ([place]) 02/06/2018  . Non-allergic rhinitis  02/06/2018  . Severe persistent asthma, uncomplicated 85/11/7739  . Pulmonary nodules 02/06/2018  . Bronchiectasis without  complication ([place]) 28/78/6767  . DM type 2 causing vascular disease ([place]) 02/06/2018   9:54 AM, 02/18/21 Jerene Pitch, DPT Physical Therapy with [facility]  [phone] office  [facility] [address] [place], [place], [address] Phone: [phone]   Fax:  [phone]  Name: Laura [name] MRN: [id number] Date of Birth: September 03, 1961

## 2021-02-18 NOTE — Progress Notes (Signed)
Medical Nutrition Therapy  Appointment Start time:  0800 Appointment End time: 0830  Follow up A1C 6.2% down from 7.7%..  [name] last week. Has been taken off meal time insulin. Having to eat between meals due to BS dropping per her Elenor Legato and she feels that is contributing to her gaining a few pounds. Elenor Legato has been a excellent tool she notes because she can see what her BS are doing in response of what she is eating and her exercise.  NUTRITION ASSESSMENT  She is eating a lot healthier. 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 Has strong support system from her family who are eating very healthy too. Wants to get a mediterranean cookbook to help with creative ideas for meals.   Anthropometrics   FBS: 128 mg/dl.  Range 124-128 mg/dl..  Clinical Medical Hx: CHF and DM,  Medications: Lantus 45, Trulicity weekly.  Labs:  Lab Results  Component Value Date   HGBA1C 6.2 02/12/2021   CMP Latest Ref Rng & Units 12/07/2020 11/02/2020 08/13/2020  Glucose 70 - 99 mg/dL 70 - 149(H)  BUN 6 - 20 mg/dL 13 19 15   Creatinine 0.44 - 1.00 mg/dL 0.64 1.1 0.76  Sodium 135 - 145 mmol/L 139 140 137  Potassium 3.5 - 5.1 mmol/L 3.6 4.2 4.5  Chloride 98 - 111 mmol/L 108 104 98  CO2 22 - 32 mmol/L 21(L) - 30  Calcium 8.9 - 10.3 mg/dL 9.0 8.4(A) 9.4  Total Protein 6.5 - 8.1 g/dL - - 7.2  Total Bilirubin 0.3 - 1.2 mg/dL - - 0.5  Alkaline Phos 25 - 125 - 69 60  AST 13 - 35 - 15 16  ALT 7 - 35 - 8 14   Notable Signs/Symptoms:    Lifestyle & Dietary  Lives with her sister. Works at [employer].   Estimated daily fluid intake:  56 Supplements:  Sleep: varies  Stress / self-care: stress eater Current average weekly physical activity: ADL   Eating 3 balanced meals per day Drinking water Cut out all sweets, pastries and desserts.   Estimated Energy Needs Calories: 1200 Carbohydrate: 135g Protein: 90g Fat:  33g   NUTRITION DIAGNOSIS  NB-1.1 Food and nutrition-related knowledge deficit As related to Diabetes Type 2.  As evidenced by A1C 7.7%.   NUTRITION INTERVENTION  Nutrition education (E-1) on the following topics:  . Nutrition and Diabetes education provided on My Plate, CHO counting, meal planning, portion sizes, timing of meals, avoiding snacks between meals unless having a low blood sugar, target ranges for A1C and blood sugars, signs/symptoms and treatment of hyper/hypoglycemia, monitoring blood sugars, taking medications as prescribed, benefits of exercising 30 minutes per day and prevention of complications of DM. Marland Kitchen   Handouts Provided Include   .  Learning Style & Readiness for Change Teaching method utilized: Visual & Auditory  Demonstrated degree of understanding via: Teach Back  Barriers to learning/adherence to lifestyle change: none  Goals Established by Pt . Follow My Plate-try more plant based recipes. . Eat 30 CHO at meals . Per [name], reduce Lantus to 40 units. . Do not skip meals . Measure foods out . Increase low carb vegetables to 1 cup with lunch and dinner daily. Marland Kitchen Keep drinking water only . Get A1C to 5.7%. Marland Kitchen Lose 2 lbs per month .     MONITORING & EVALUATION Dietary intake, weekly physical activity, and blood  sugars in 1 month.  Next Steps  Look into buying a mediterranean cookbook or plant based recipe ideas.

## 2021-02-19 ENCOUNTER — Ambulatory Visit (INDEPENDENT_AMBULATORY_CARE_PROVIDER_SITE_OTHER): Payer: Medicare Other

## 2021-02-19 DIAGNOSIS — I10 Essential (primary) hypertension: Secondary | ICD-10-CM | POA: Diagnosis not present

## 2021-02-19 DIAGNOSIS — E559 Vitamin D deficiency, unspecified: Secondary | ICD-10-CM | POA: Diagnosis not present

## 2021-02-19 DIAGNOSIS — G629 Polyneuropathy, unspecified: Secondary | ICD-10-CM

## 2021-02-19 DIAGNOSIS — D519 Vitamin B12 deficiency anemia, unspecified: Secondary | ICD-10-CM | POA: Diagnosis not present

## 2021-02-19 DIAGNOSIS — E1140 Type 2 diabetes mellitus with diabetic neuropathy, unspecified: Secondary | ICD-10-CM | POA: Diagnosis not present

## 2021-02-19 DIAGNOSIS — E039 Hypothyroidism, unspecified: Secondary | ICD-10-CM | POA: Diagnosis not present

## 2021-02-19 DIAGNOSIS — M2041 Other hammer toe(s) (acquired), right foot: Secondary | ICD-10-CM

## 2021-02-19 DIAGNOSIS — M2042 Other hammer toe(s) (acquired), left foot: Secondary | ICD-10-CM | POA: Diagnosis not present

## 2021-02-22 ENCOUNTER — Other Ambulatory Visit: Payer: Self-pay | Admitting: Pulmonary Disease

## 2021-02-22 ENCOUNTER — Ambulatory Visit (HOSPITAL_COMMUNITY): Payer: Medicare Other | Admitting: Physical Therapy

## 2021-02-22 DIAGNOSIS — R112 Nausea with vomiting, unspecified: Secondary | ICD-10-CM | POA: Diagnosis not present

## 2021-02-23 ENCOUNTER — Ambulatory Visit (HOSPITAL_COMMUNITY): Payer: Medicare Other | Admitting: Physical Therapy

## 2021-02-25 ENCOUNTER — Other Ambulatory Visit: Payer: Self-pay

## 2021-02-25 ENCOUNTER — Ambulatory Visit (HOSPITAL_COMMUNITY): Payer: Medicare Other | Admitting: Physical Therapy

## 2021-02-25 ENCOUNTER — Ambulatory Visit (INDEPENDENT_AMBULATORY_CARE_PROVIDER_SITE_OTHER): Payer: Medicare Other | Admitting: Clinical

## 2021-02-25 DIAGNOSIS — F4310 Post-traumatic stress disorder, unspecified: Secondary | ICD-10-CM

## 2021-02-25 NOTE — Progress Notes (Signed)
Virtual Visit via Video Note  I connected withTamara [name] 05/19/22at2:00PM ESTby a video enabled telemedicine application and verified that I am speaking with the correct person using two identifiers.  Location: Patient:Home Provider:Office  I discussed the limitations of evaluation and management by telemedicine and the availability of in person appointments. The patient expressed understanding and agreed to proceed.    THERAPIST PROGRESS NOTE  Session Time:2:00PM-2:45PM  Participation Level:Active  Behavioral Response:CasualAlertDepressed  Type of Therapy:Individual Therapy  Treatment Goals addressed:Coping  Interventions:CBT, DBT, Solution Focused, Strength-based and Supportive  Summary:Laura Leppertis a 60 y.o.femalewho presents with PTSD.The OPT therapist worked with thepatientfor herongoing OPT treatmentsession. The OPT therapist utilized Motivational Interviewing to assist in creating therapeutic repore. The patient in the session was engaged and work in Science writer about hertriggers and symptoms over the past few weeksincluding catching the nora stomach bug.The OPT therapist utilized Cognitive Behavioral Therapy through cognitive restructuring as well as worked with the patient on coping strategies to assist in management ofmental healthsymptoms as well astrying to recover from the stomach bug.The patientspokeof her acknowledgement that she feel nausea for the next few days.The OPT therapist continued to give support aroundcontinuing to follow her health treatment plans.  Suicidal/Homicidal:Nowithout intent/plan  Therapist Response:The OPT therapist worked with the patient for the patients scheduled session. The patient was engaged in hersession and gave feedback in relation to triggers, symptoms, and behavior responses over the pastfewweeks. The OPT therapist worked with the patient utilizing an in  session Cognitive Behavioral Therapy exercise. 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 The OPT therapist encouraged the patient to follow her medical advise and focus on her recovery.The OPT therapist will continue treatment work with the patient in hernext scheduled session  Plan: Return again in2/3weeks.  Diagnosis:Axis I:Post Traumatic Stress Disorder  Axis II:No diagnosis  I discussed the assessment and treatment plan with the patient. The patient was provided an opportunity to ask questions and all were answered. The patient agreed with the plan and demonstrated an understanding of the instructions.  The patient was advised to call back or seek an in-person evaluation if the symptoms worsen or if the condition fails to improve as anticipated.  I provided73minutes of non-face-to-face time during this encounter.  [name], LCSW  02/25/2021

## 2021-02-26 DIAGNOSIS — G250 Essential tremor: Secondary | ICD-10-CM | POA: Diagnosis not present

## 2021-02-26 DIAGNOSIS — J45909 Unspecified asthma, uncomplicated: Secondary | ICD-10-CM | POA: Diagnosis not present

## 2021-02-26 DIAGNOSIS — D801 Nonfamilial hypogammaglobulinemia: Secondary | ICD-10-CM | POA: Diagnosis not present

## 2021-02-26 DIAGNOSIS — E1165 Type 2 diabetes mellitus with hyperglycemia: Secondary | ICD-10-CM | POA: Diagnosis not present

## 2021-02-26 DIAGNOSIS — E039 Hypothyroidism, unspecified: Secondary | ICD-10-CM | POA: Diagnosis not present

## 2021-02-26 DIAGNOSIS — F4312 Post-traumatic stress disorder, chronic: Secondary | ICD-10-CM | POA: Diagnosis not present

## 2021-02-26 DIAGNOSIS — I480 Paroxysmal atrial fibrillation: Secondary | ICD-10-CM | POA: Diagnosis not present

## 2021-02-26 DIAGNOSIS — E1140 Type 2 diabetes mellitus with diabetic neuropathy, unspecified: Secondary | ICD-10-CM | POA: Diagnosis not present

## 2021-02-26 DIAGNOSIS — E782 Mixed hyperlipidemia: Secondary | ICD-10-CM | POA: Diagnosis not present

## 2021-02-26 DIAGNOSIS — I5032 Chronic diastolic (congestive) heart failure: Secondary | ICD-10-CM | POA: Diagnosis not present

## 2021-02-26 DIAGNOSIS — I10 Essential (primary) hypertension: Secondary | ICD-10-CM | POA: Diagnosis not present

## 2021-02-26 DIAGNOSIS — F3160 Bipolar disorder, current episode mixed, unspecified: Secondary | ICD-10-CM | POA: Diagnosis not present

## 2021-02-27 ENCOUNTER — Other Ambulatory Visit: Payer: Self-pay | Admitting: Cardiology

## 2021-03-02 ENCOUNTER — Encounter (HOSPITAL_COMMUNITY): Payer: Medicare Other | Admitting: Physical Therapy

## 2021-03-04 ENCOUNTER — Encounter (HOSPITAL_COMMUNITY): Payer: Medicare Other | Admitting: Physical Therapy

## 2021-03-09 ENCOUNTER — Encounter (HOSPITAL_COMMUNITY): Payer: Medicare Other | Admitting: Physical Therapy

## 2021-03-09 ENCOUNTER — Telehealth: Payer: Self-pay | Admitting: "Endocrinology

## 2021-03-09 NOTE — Telephone Encounter (Signed)
Pt states she's experiencing difficulty keeping her sensor from detaching. Advised pt to apply sensor to upper inner arm and apply a thin clear dressing. Pt stated she is waiting on replacement sensors to come through the mail. Advised pt we have a sample and she can come by the office to pick up. Understanding voiced.

## 2021-03-09 NOTE — Telephone Encounter (Signed)
Tried to call pt, was unable to make contact.

## 2021-03-09 NOTE — Telephone Encounter (Signed)
Pt left a voicemail requesting a nurse to call back and assist her with her libre. [phone]

## 2021-03-11 ENCOUNTER — Encounter (HOSPITAL_COMMUNITY): Payer: Medicare Other | Admitting: Physical Therapy

## 2021-03-12 DIAGNOSIS — G5601 Carpal tunnel syndrome, right upper limb: Secondary | ICD-10-CM | POA: Diagnosis not present

## 2021-03-15 ENCOUNTER — Other Ambulatory Visit (HOSPITAL_COMMUNITY): Payer: Self-pay | Admitting: Student

## 2021-03-15 DIAGNOSIS — E1140 Type 2 diabetes mellitus with diabetic neuropathy, unspecified: Secondary | ICD-10-CM | POA: Diagnosis not present

## 2021-03-15 DIAGNOSIS — F4312 Post-traumatic stress disorder, chronic: Secondary | ICD-10-CM | POA: Diagnosis not present

## 2021-03-15 DIAGNOSIS — I10 Essential (primary) hypertension: Secondary | ICD-10-CM | POA: Diagnosis not present

## 2021-03-15 DIAGNOSIS — E1165 Type 2 diabetes mellitus with hyperglycemia: Secondary | ICD-10-CM | POA: Diagnosis not present

## 2021-03-15 DIAGNOSIS — L089 Local infection of the skin and subcutaneous tissue, unspecified: Secondary | ICD-10-CM | POA: Diagnosis not present

## 2021-03-15 DIAGNOSIS — G250 Essential tremor: Secondary | ICD-10-CM | POA: Diagnosis not present

## 2021-03-15 DIAGNOSIS — I480 Paroxysmal atrial fibrillation: Secondary | ICD-10-CM | POA: Diagnosis not present

## 2021-03-15 DIAGNOSIS — F3160 Bipolar disorder, current episode mixed, unspecified: Secondary | ICD-10-CM | POA: Diagnosis not present

## 2021-03-15 DIAGNOSIS — D801 Nonfamilial hypogammaglobulinemia: Secondary | ICD-10-CM | POA: Diagnosis not present

## 2021-03-15 DIAGNOSIS — I5032 Chronic diastolic (congestive) heart failure: Secondary | ICD-10-CM | POA: Diagnosis not present

## 2021-03-15 DIAGNOSIS — E039 Hypothyroidism, unspecified: Secondary | ICD-10-CM | POA: Diagnosis not present

## 2021-03-15 DIAGNOSIS — M79672 Pain in left foot: Secondary | ICD-10-CM

## 2021-03-15 DIAGNOSIS — E782 Mixed hyperlipidemia: Secondary | ICD-10-CM | POA: Diagnosis not present

## 2021-03-18 ENCOUNTER — Other Ambulatory Visit: Payer: Self-pay

## 2021-03-18 ENCOUNTER — Ambulatory Visit (HOSPITAL_COMMUNITY): Payer: Medicare Other | Admitting: Clinical

## 2021-03-19 NOTE — Progress Notes (Signed)
Patient in office and seen by EJ with [employer] for diabetic shoe pickup. The break-in process was explained to patient by EJ and shoes were tried on before leaving the office.

## 2021-03-31 ENCOUNTER — Other Ambulatory Visit: Payer: Self-pay

## 2021-03-31 ENCOUNTER — Ambulatory Visit (INDEPENDENT_AMBULATORY_CARE_PROVIDER_SITE_OTHER): Payer: Medicare Other | Admitting: Clinical

## 2021-03-31 DIAGNOSIS — F4310 Post-traumatic stress disorder, unspecified: Secondary | ICD-10-CM | POA: Diagnosis not present

## 2021-03-31 DIAGNOSIS — F330 Major depressive disorder, recurrent, mild: Secondary | ICD-10-CM

## 2021-03-31 NOTE — Progress Notes (Signed)
Virtual Visit via Video Note   I connected with Nadelyn Enriques on 03/31/21 at 9:00 AM EST by a video enabled telemedicine application and verified that I am speaking with the correct person using two identifiers.   Location: Patient: Home Provider: Office   I discussed the limitations of evaluation and management by telemedicine and the availability of in person appointments. The patient expressed understanding and agreed to proceed.       THERAPIST PROGRESS NOTE   Session Time: 9:00AM-9:45AM   Participation Level: Active   Behavioral Response: CasualAlertDepressed   Type of Therapy: Individual Therapy   Treatment Goals addressed: Coping   Interventions: CBT, DBT, Solution Focused, Strength-based and Supportive   Summary: Fatina Sprankle is a 60 y.o. female who presents with PTSD. The OPT therapist worked with the patient for her ongoing OPT treatment session. The OPT therapist utilized Motivational Interviewing to assist in creating therapeutic repore. The patient in the session was engaged and work in collaboration giving feedback about her triggers and symptoms over the past few weeks including not being able to go to a concert and camping and this created a conflict between the patient and her sisters husband. The OPT therapist utilized Cognitive Behavioral Therapy through cognitive restructuring as well as worked with the patient on coping strategies to assist in management of mental health symptoms. The patient did note recently going to see [facility] in concert. The OPT therapist continued to give support around continuing to follow her health treatment plans.   Suicidal/Homicidal: Nowithout intent/plan   Therapist Response:The OPT therapist worked with the patient for the patients scheduled session. The patient was engaged in her session and gave feedback in relation to triggers, symptoms, and behavior responses over the past few weeks. The OPT therapist worked with the patient  utilizing an in session Cognitive Behavioral Therapy exercise. The patient was responsive in the session and verbalized, "I just was uspet with my sisters husband because he could have worked it out for me to stay in the camper and go camping and to this event and he was really mean about things!". The OPT therapist validated the patients feelings . The patient spoke about leaving her job. The patient notes she will not be going with her sister for a camping trip during July 4th holiday. The patient spoke about working on making new plans with her friend Hassan Rowan who is around the patients same age and not married and the patient has been recently hanging with. The patient verbalized in the session some ongoing difficulty and conflict in relation to her interaction with her daughter Ernst Bowler) who she has had conflict and verbal alterations with in the past. The OPT therapist will continue treatment work with the patient in her next scheduled session   Plan: Return again in 2/3 weeks.   Diagnosis:      Axis I:Post Traumatic Stress Disorder                           Axis II: No diagnosis   I discussed the assessment and treatment plan with the patient. The patient was provided an opportunity to ask questions and all were answered. The patient agreed with the plan and demonstrated an understanding of the instructions.   The patient was advised to call back or seek an in-person evaluation if the symptoms worsen or if the condition fails to improve as anticipated.   I provided 45 minutes of non-face-to-face time during  this encounter.   [name], LCSW   03/31/2021

## 2021-04-05 DIAGNOSIS — R110 Nausea: Secondary | ICD-10-CM | POA: Diagnosis not present

## 2021-04-05 DIAGNOSIS — J069 Acute upper respiratory infection, unspecified: Secondary | ICD-10-CM | POA: Diagnosis not present

## 2021-04-05 DIAGNOSIS — R052 Subacute cough: Secondary | ICD-10-CM | POA: Diagnosis not present

## 2021-04-07 ENCOUNTER — Ambulatory Visit (INDEPENDENT_AMBULATORY_CARE_PROVIDER_SITE_OTHER): Payer: Medicare Other | Admitting: Family

## 2021-04-07 ENCOUNTER — Encounter: Payer: Self-pay | Admitting: Family

## 2021-04-07 ENCOUNTER — Observation Stay (HOSPITAL_BASED_OUTPATIENT_CLINIC_OR_DEPARTMENT_OTHER)
Admission: EM | Admit: 2021-04-07 | Discharge: 2021-04-08 | Disposition: A | Discharge status: Home / Self Care | Payer: Medicare Other | Source: Home / Self Care | Attending: Emergency Medicine | Admitting: Emergency Medicine

## 2021-04-07 ENCOUNTER — Telehealth: Payer: Self-pay | Admitting: Allergy & Immunology

## 2021-04-07 ENCOUNTER — Other Ambulatory Visit: Payer: Self-pay

## 2021-04-07 ENCOUNTER — Telehealth: Payer: Self-pay

## 2021-04-07 ENCOUNTER — Other Ambulatory Visit: Payer: Self-pay | Admitting: Allergy

## 2021-04-07 ENCOUNTER — Telehealth: Payer: Self-pay | Admitting: Family

## 2021-04-07 ENCOUNTER — Encounter (HOSPITAL_COMMUNITY): Payer: Self-pay

## 2021-04-07 ENCOUNTER — Emergency Department (HOSPITAL_COMMUNITY): Payer: Medicare Other

## 2021-04-07 VITALS — BP 116/66 | HR 112

## 2021-04-07 DIAGNOSIS — R0602 Shortness of breath: Secondary | ICD-10-CM | POA: Diagnosis not present

## 2021-04-07 DIAGNOSIS — R059 Cough, unspecified: Secondary | ICD-10-CM | POA: Diagnosis not present

## 2021-04-07 DIAGNOSIS — Z794 Long term (current) use of insulin: Secondary | ICD-10-CM | POA: Insufficient documentation

## 2021-04-07 DIAGNOSIS — D801 Nonfamilial hypogammaglobulinemia: Secondary | ICD-10-CM | POA: Insufficient documentation

## 2021-04-07 DIAGNOSIS — I5032 Chronic diastolic (congestive) heart failure: Secondary | ICD-10-CM | POA: Diagnosis not present

## 2021-04-07 DIAGNOSIS — D839 Common variable immunodeficiency, unspecified: Secondary | ICD-10-CM

## 2021-04-07 DIAGNOSIS — I480 Paroxysmal atrial fibrillation: Secondary | ICD-10-CM

## 2021-04-07 DIAGNOSIS — Z79899 Other long term (current) drug therapy: Secondary | ICD-10-CM | POA: Insufficient documentation

## 2021-04-07 DIAGNOSIS — J4551 Severe persistent asthma with (acute) exacerbation: Secondary | ICD-10-CM | POA: Diagnosis not present

## 2021-04-07 DIAGNOSIS — I248 Other forms of acute ischemic heart disease: Secondary | ICD-10-CM | POA: Diagnosis not present

## 2021-04-07 DIAGNOSIS — R739 Hyperglycemia, unspecified: Secondary | ICD-10-CM | POA: Diagnosis not present

## 2021-04-07 DIAGNOSIS — E039 Hypothyroidism, unspecified: Secondary | ICD-10-CM | POA: Diagnosis present

## 2021-04-07 DIAGNOSIS — E1165 Type 2 diabetes mellitus with hyperglycemia: Secondary | ICD-10-CM | POA: Diagnosis present

## 2021-04-07 DIAGNOSIS — Z9989 Dependence on other enabling machines and devices: Secondary | ICD-10-CM | POA: Diagnosis not present

## 2021-04-07 DIAGNOSIS — I110 Hypertensive heart disease with heart failure: Secondary | ICD-10-CM | POA: Insufficient documentation

## 2021-04-07 DIAGNOSIS — Z8679 Personal history of other diseases of the circulatory system: Secondary | ICD-10-CM | POA: Diagnosis not present

## 2021-04-07 DIAGNOSIS — J45901 Unspecified asthma with (acute) exacerbation: Secondary | ICD-10-CM

## 2021-04-07 DIAGNOSIS — I700 Atherosclerosis of aorta: Secondary | ICD-10-CM | POA: Diagnosis not present

## 2021-04-07 DIAGNOSIS — G4733 Obstructive sleep apnea (adult) (pediatric): Secondary | ICD-10-CM | POA: Diagnosis not present

## 2021-04-07 DIAGNOSIS — E782 Mixed hyperlipidemia: Secondary | ICD-10-CM | POA: Diagnosis not present

## 2021-04-07 DIAGNOSIS — Z87891 Personal history of nicotine dependence: Secondary | ICD-10-CM | POA: Insufficient documentation

## 2021-04-07 DIAGNOSIS — R079 Chest pain, unspecified: Secondary | ICD-10-CM | POA: Diagnosis not present

## 2021-04-07 DIAGNOSIS — J310 Chronic rhinitis: Secondary | ICD-10-CM

## 2021-04-07 DIAGNOSIS — Z20822 Contact with and (suspected) exposure to covid-19: Secondary | ICD-10-CM | POA: Insufficient documentation

## 2021-04-07 DIAGNOSIS — E1159 Type 2 diabetes mellitus with other circulatory complications: Secondary | ICD-10-CM

## 2021-04-07 DIAGNOSIS — I4891 Unspecified atrial fibrillation: Secondary | ICD-10-CM | POA: Diagnosis not present

## 2021-04-07 LAB — TROPONIN I (HIGH SENSITIVITY)
Troponin I (High Sensitivity): 14 ng/L (ref <18)
Troponin I (High Sensitivity): 15 ng/L (ref <18)

## 2021-04-07 LAB — BASIC METABOLIC PANEL
Anion gap: 12 (ref 5–15)
BUN: 32 mg/dL — ABNORMAL HIGH (ref 6–20)
CO2: 22 mmol/L (ref 22–32)
Calcium: 9.1 mg/dL (ref 8.9–10.3)
Chloride: 101 mmol/L (ref 98–111)
Creatinine, Ser: 1.05 mg/dL — ABNORMAL HIGH (ref 0.44–1.00)
GFR, Estimated: >60 mL/min (ref >60)
Glucose, Bld: 455 mg/dL — ABNORMAL HIGH (ref 70–99)
Potassium: 4.2 mmol/L (ref 3.5–5.1)
Sodium: 135 mmol/L (ref 135–145)

## 2021-04-07 LAB — CBC
HCT: 41.2 % (ref 36.0–46.0)
Hemoglobin: 14.6 g/dL (ref 12.0–15.0)
MCH: 32.6 pg (ref 26.0–34.0)
MCHC: 35.4 g/dL (ref 30.0–36.0)
MCV: 92 fL (ref 80.0–100.0)
Platelets: 193 10*3/uL (ref 150–400)
RBC: 4.48 MIL/uL (ref 3.87–5.11)
RDW: 12.8 % (ref 11.5–15.5)
WBC: 4.1 10*3/uL (ref 4.0–10.5)
nRBC: 0 % (ref 0.0–0.2)

## 2021-04-07 LAB — RESP PANEL BY RT-PCR (FLU A&B, COVID) ARPGX2
Influenza A by PCR: NEGATIVE
Influenza B by PCR: NEGATIVE
SARS Coronavirus 2 by RT PCR: NEGATIVE

## 2021-04-07 LAB — BRAIN NATRIURETIC PEPTIDE: B Natriuretic Peptide: 75 pg/mL (ref 0.0–100.0)

## 2021-04-07 IMAGING — CT CT ANGIO CHEST
2 of 6 series · 18 of 46 positions shown · IV contrast (Omnipaque or Isovue)
Comparison: Chest CT dated [DATE] and radiograph dated
[DATE].

CLINICAL DATA: 60-year-old female with concern for pulmonary
embolism.

EXAM:
CT ANGIOGRAPHY CHEST WITH CONTRAST
TECHNIQUE: Multidetector CT imaging of the chest was performed using the
standard protocol during bolus administration of intravenous
contrast. Multiplanar CT image reconstructions and MIPs were
obtained to evaluate the vascular anatomy.
CONTRAST:  100mL OMNIPAQUE IOHEXOL 350 MG/ML SOLN

[Series 5: pe axial thins · axial · 0.74mm/px · z∈[-717,-425]mm · 15 of 399 slices shown]
[im 17/399  lung]
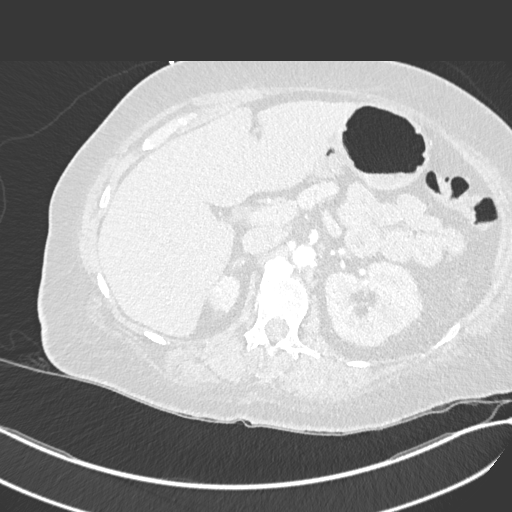
[im 50/399  soft-tissue]
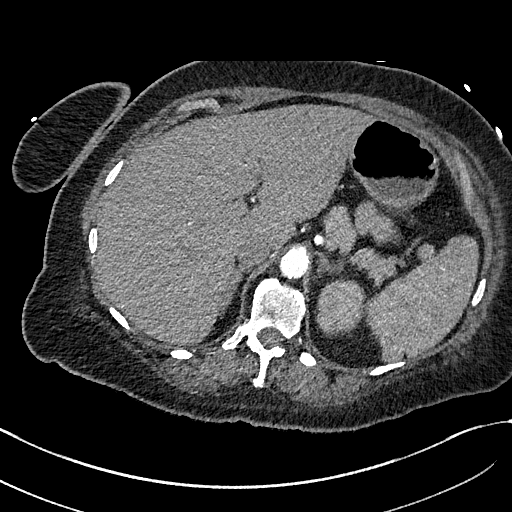
[im 67/399  lung]
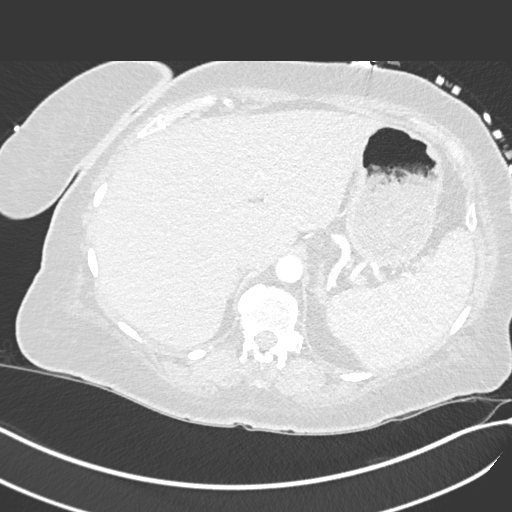
[im 100/399  soft-tissue]
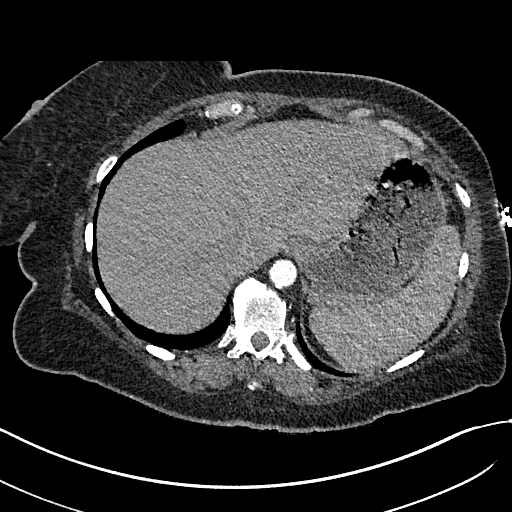
[im 117/399  lung]
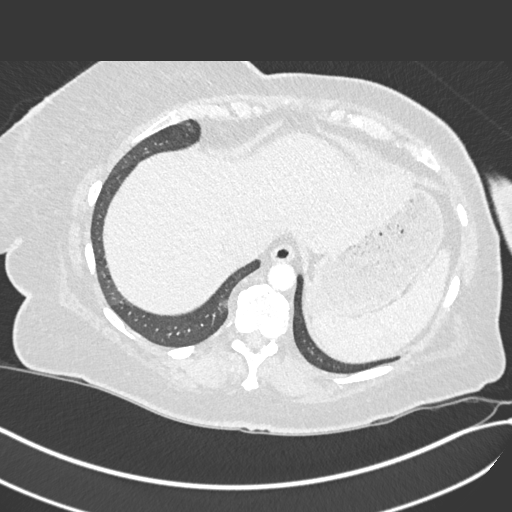
[im 150/399  soft-tissue]
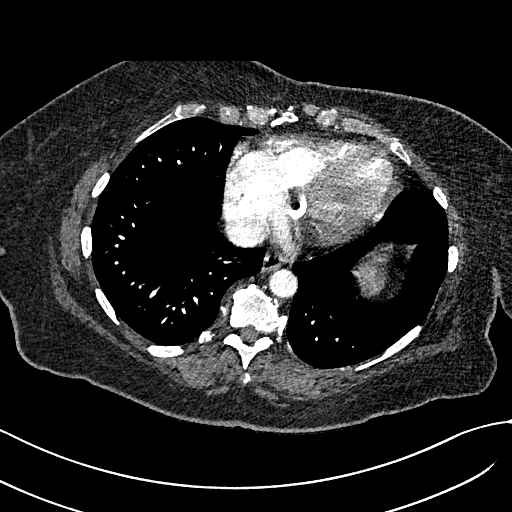
[im 166/399  lung]
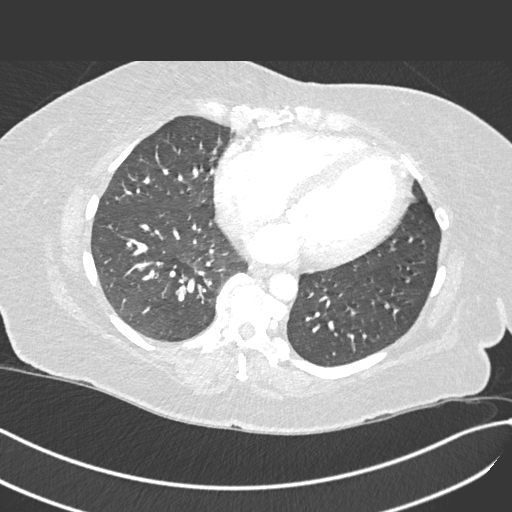
[im 200/399  soft-tissue]
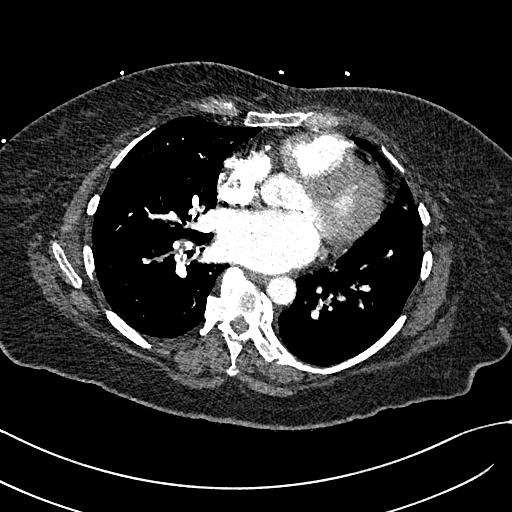
[im 233/399  lung]
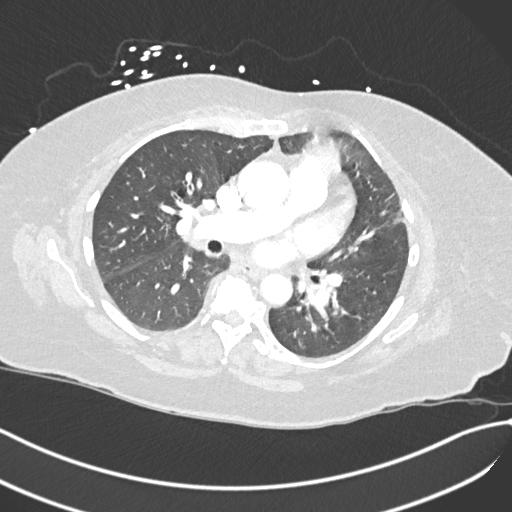
[im 249/399  soft-tissue]
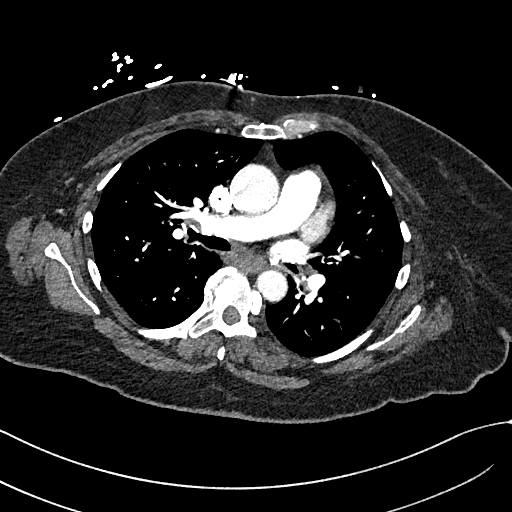
[im 282/399  lung]
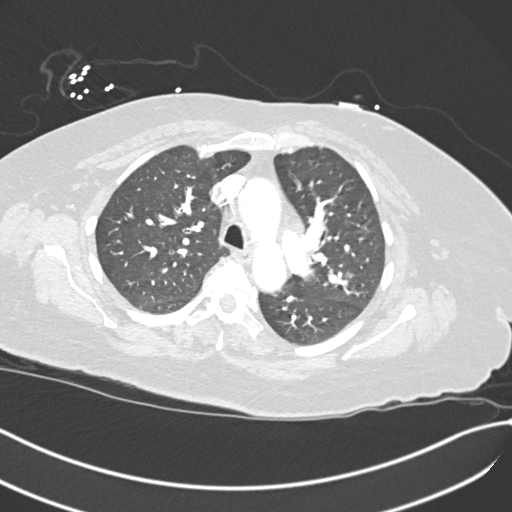
[im 299/399  soft-tissue]
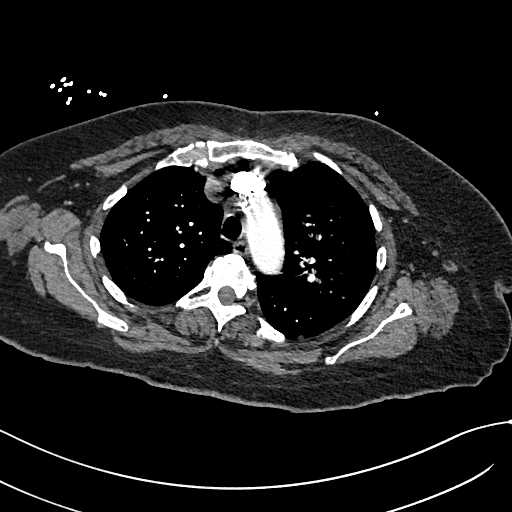
[im 332/399  lung]
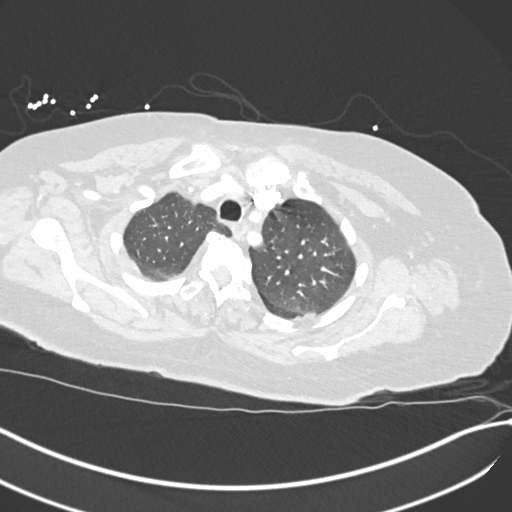
[im 349/399  soft-tissue]
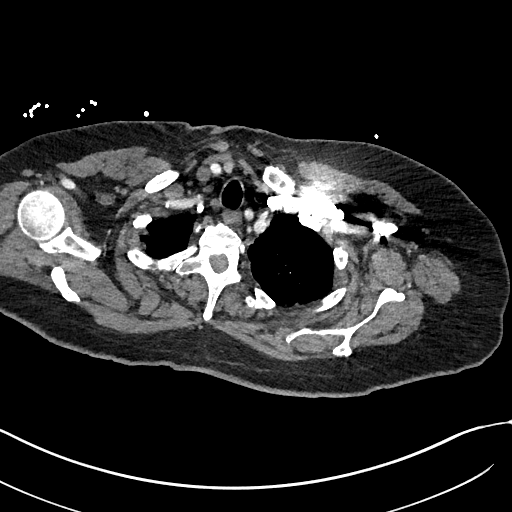
[im 382/399  lung]
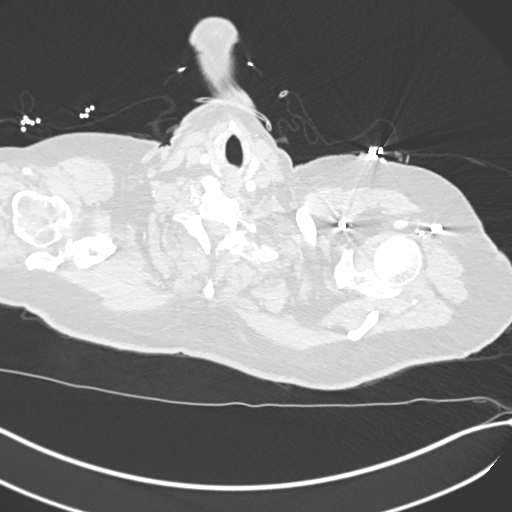

[Series 8: cor soft · coronal · 0.63mm/px · 3 of 146 slices shown]
[im 37/146  soft-tissue]
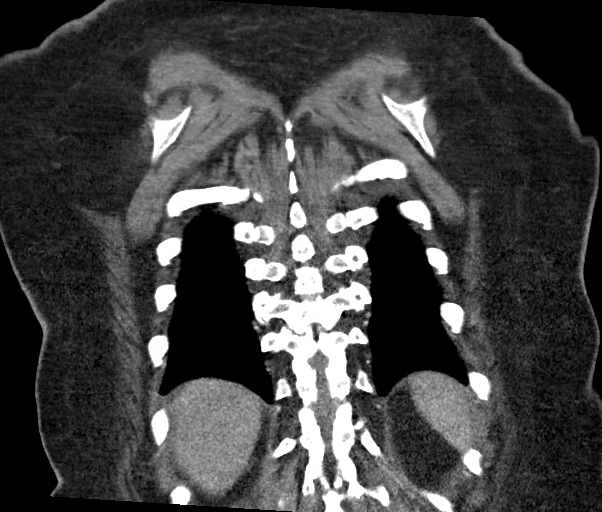
[im 73/146  soft-tissue]
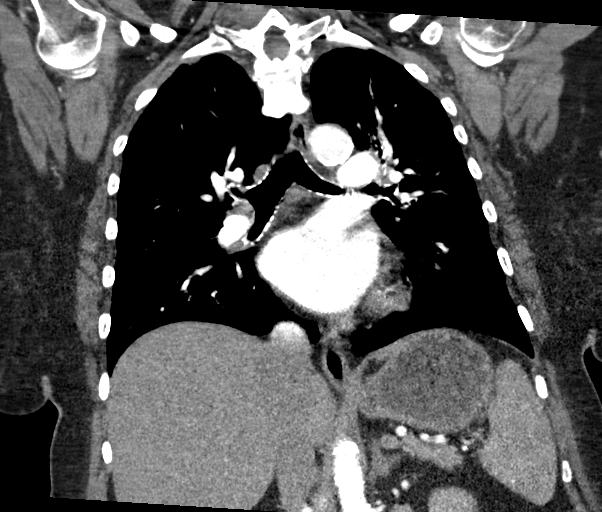
[im 109/146  soft-tissue]
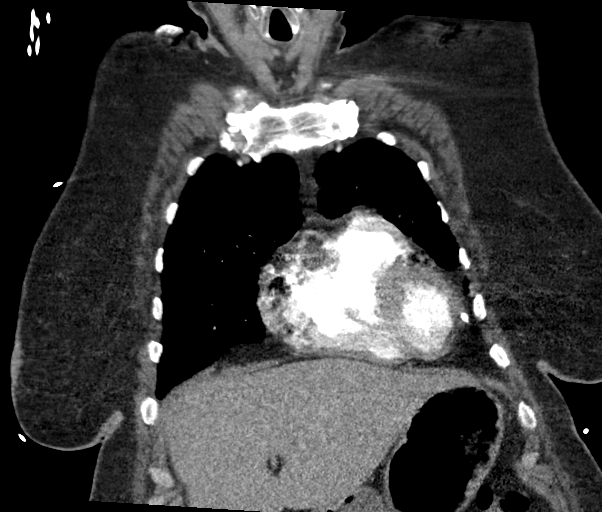

[18 of 46 positions shown; findings below may reference images not displayed]

FINDINGS: Cardiovascular: There is no cardiomegaly or pericardial effusion.
There is coronary vascular calcification and calcification of the
mitral annulus. There is mild atherosclerotic calcification of the
thoracic aorta. No aneurysmal dilatation or dissection. The origins
of the great vessels of the aortic arch appear patent. Evaluation of
the pulmonary arteries is limited due to respiratory motion
artifact. No large or central pulmonary artery embolus identified.

Mediastinum/Nodes: Mildly enlarged right hilar lymph node measuring
13 mm. The esophagus is grossly unremarkable. No mediastinal fluid
collection.

Lungs/Pleura: The lungs are clear. There is no pleural effusion or
pneumothorax. Mild bilateral lower lobe bronchial thickening and
endobronchial secretions. Aspiration is not excluded clinical
correlation is recommended. The central airways remain patent.

Upper Abdomen: No acute abnormality.

Musculoskeletal: Degenerative changes of the spine. No acute osseous
pathology.

Review of the MIP images confirms the above findings.
IMPRESSION: 1. No CT evidence of central pulmonary artery embolus.
2. Mild bilateral lower lobe bronchial thickening and endobronchial
secretions.
3. Aortic Atherosclerosis ([JI]-[JI]).

## 2021-04-07 IMAGING — DX DG CHEST 2V
2 series · 2 of 2 positions shown · non-contrast
Comparison: [DATE]

CLINICAL DATA: Chest pain, cough

EXAM:
CHEST - 2 VIEW

[chest lat]
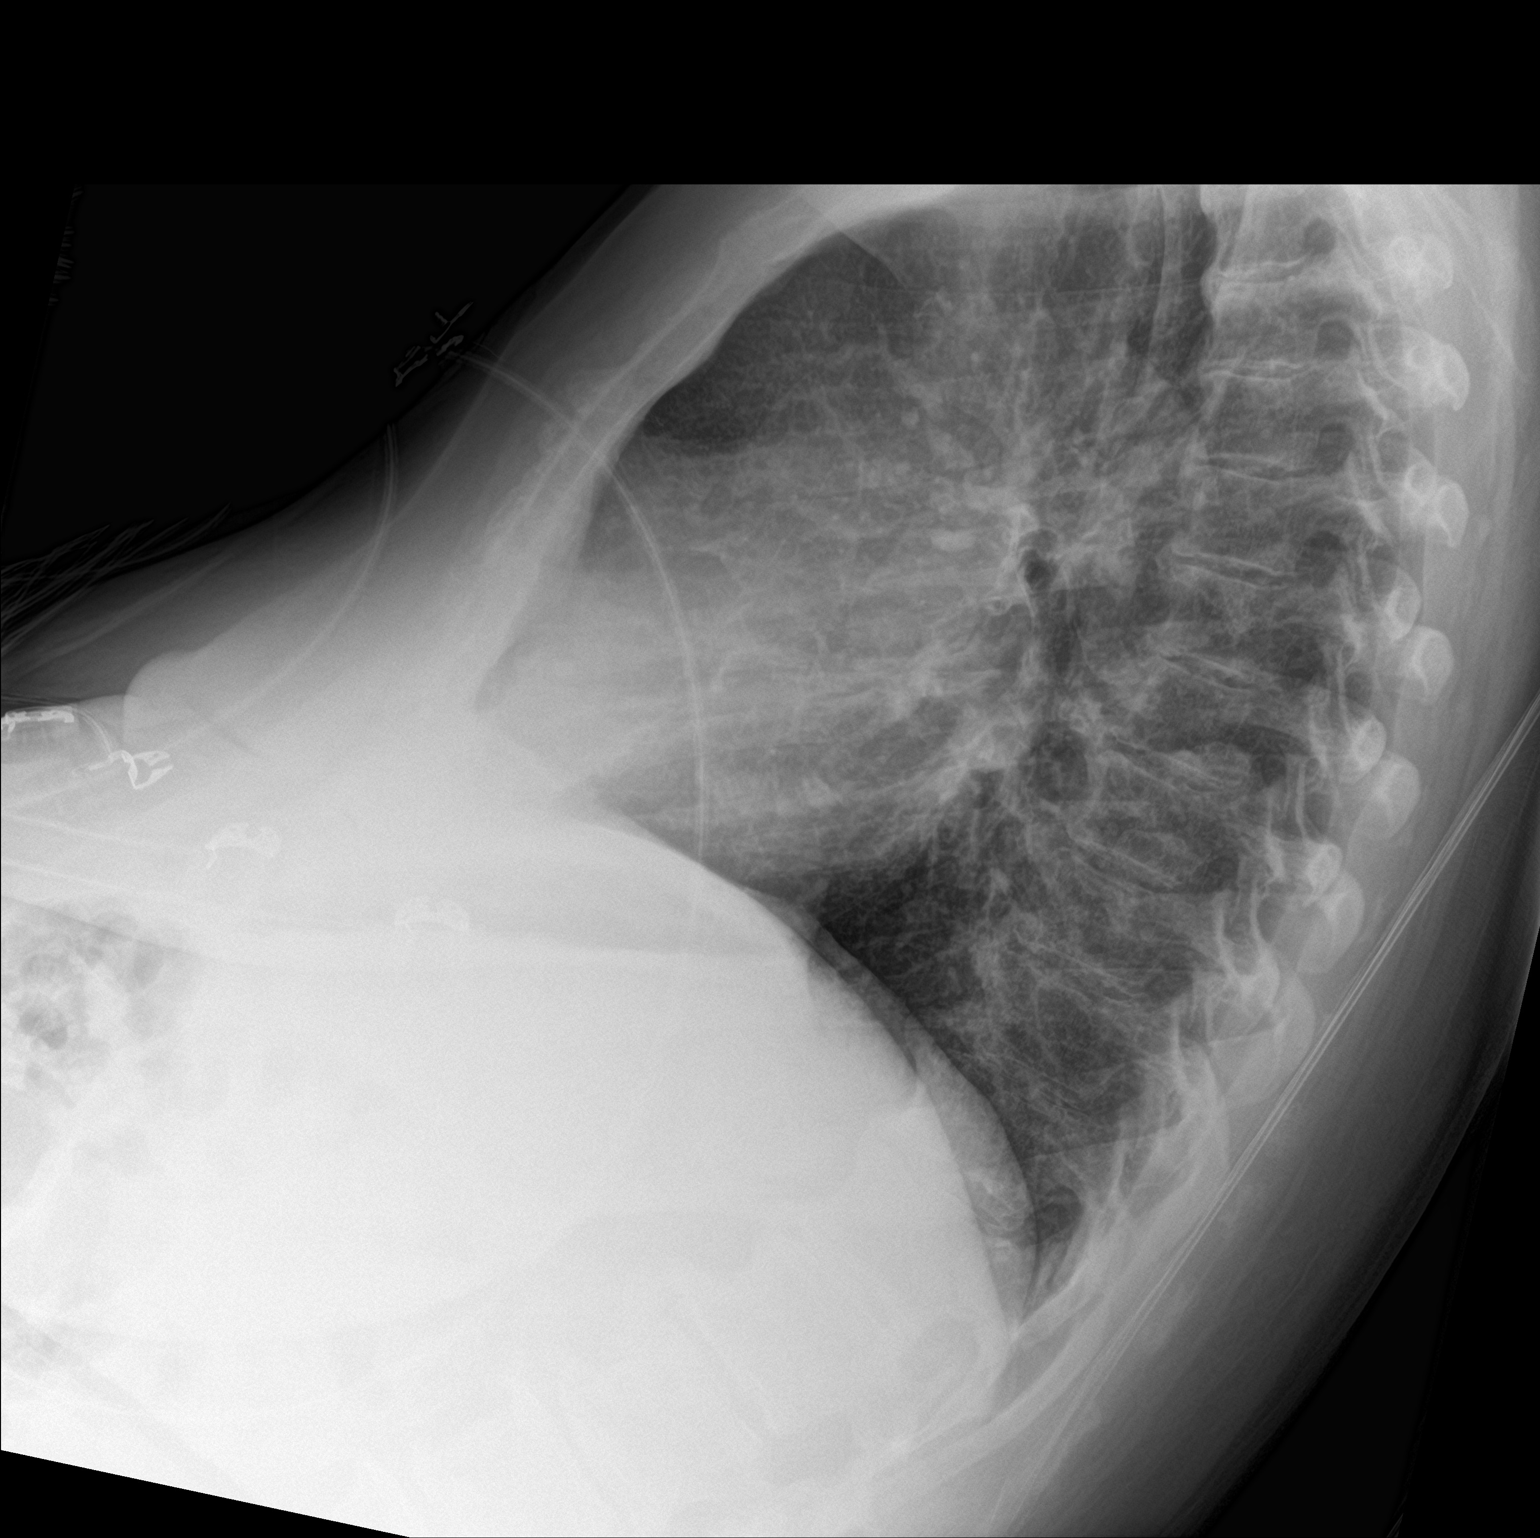

[chest ap]
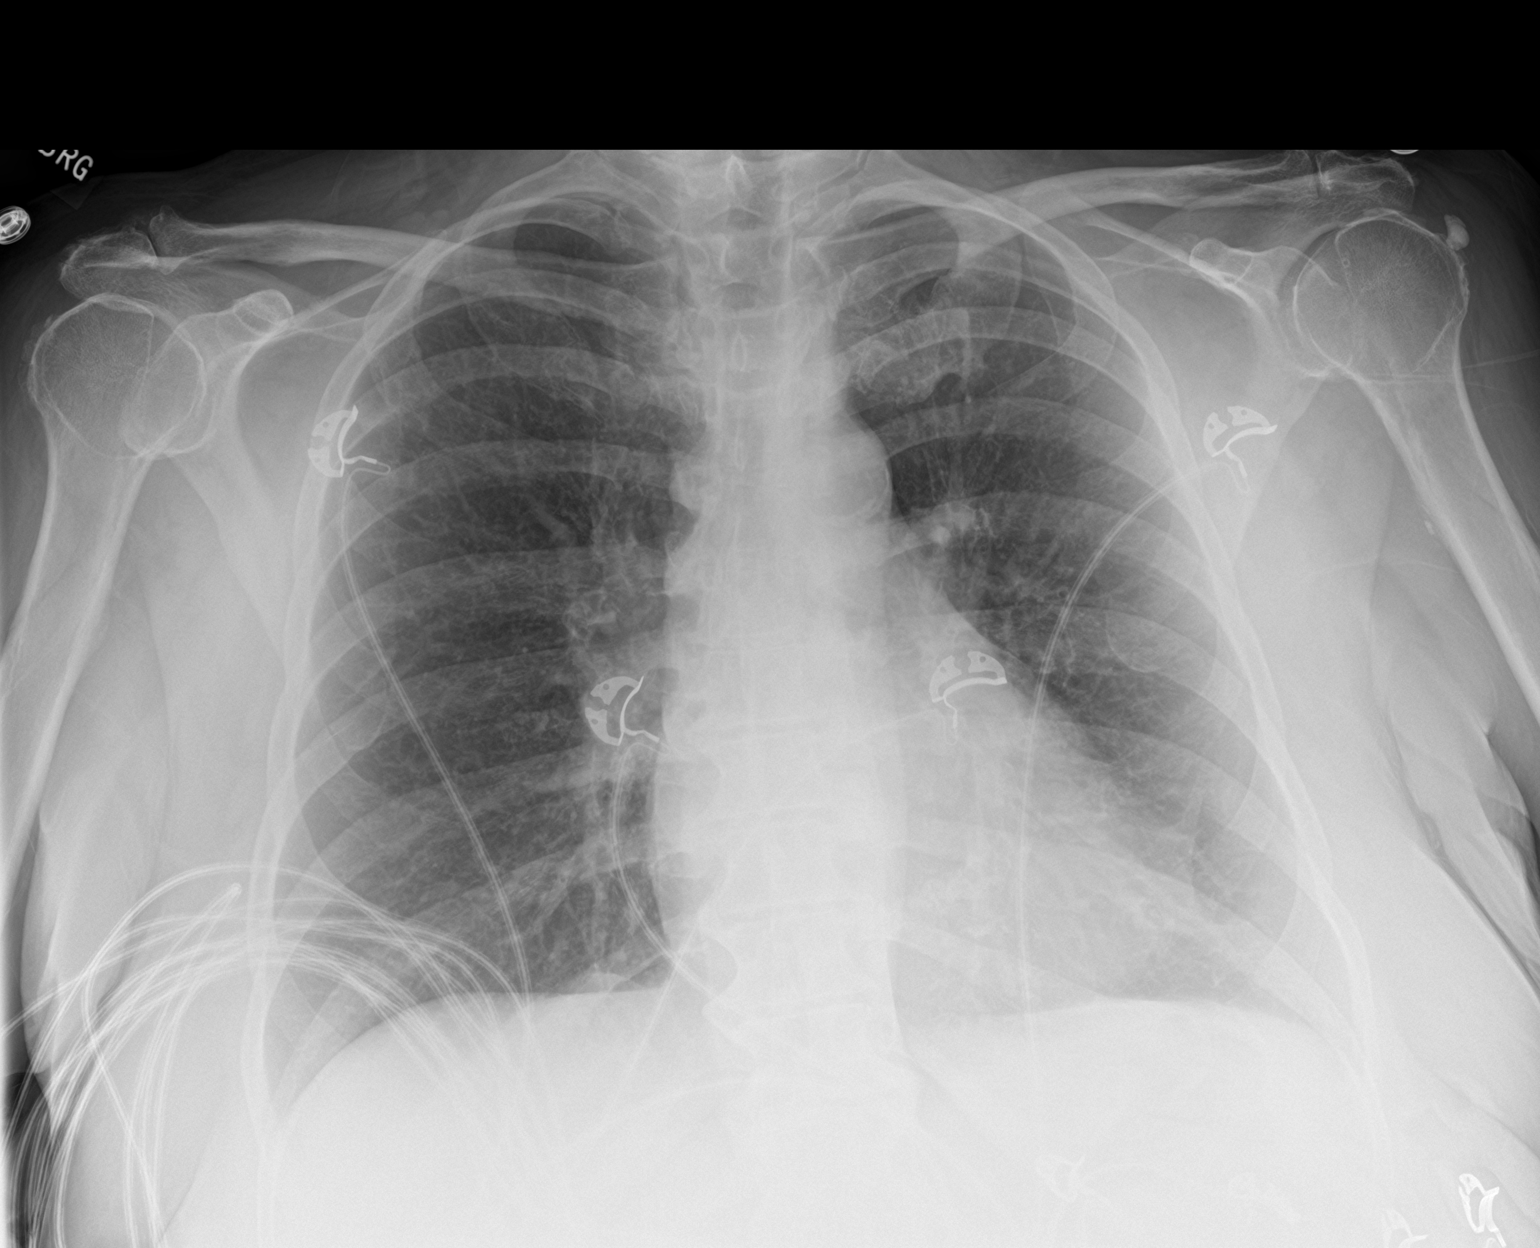

[2 of 2 positions shown; findings below may reference images not displayed]

FINDINGS: Heart and mediastinal contours are within normal limits. No focal
opacities or effusions. No acute bony abnormality.
IMPRESSION: No active cardiopulmonary disease.

## 2021-04-07 MED ORDER — ACETAMINOPHEN 500 MG PO TABS
1000.0000 mg | ORAL_TABLET | Freq: Once | ORAL | Status: AC
  Administered 2021-04-07: 1000 mg via ORAL
  Filled 2021-04-07: qty 2

## 2021-04-07 MED ORDER — IPRATROPIUM BROMIDE 0.02 % IN SOLN
0.5000 mg | Freq: Once | RESPIRATORY_TRACT | Status: AC
  Administered 2021-04-07: 0.5 mg via RESPIRATORY_TRACT
  Filled 2021-04-07: qty 2.5

## 2021-04-07 MED ORDER — HYDROXYZINE HCL 25 MG PO TABS
25.0000 mg | ORAL_TABLET | Freq: Once | ORAL | Status: AC
  Administered 2021-04-07: 25 mg via ORAL
  Filled 2021-04-07: qty 1

## 2021-04-07 MED ORDER — FENTANYL CITRATE (PF) 100 MCG/2ML IJ SOLN
50.0000 ug | Freq: Once | INTRAMUSCULAR | Status: AC
  Administered 2021-04-07: 50 ug via INTRAVENOUS
  Filled 2021-04-07: qty 2

## 2021-04-07 MED ORDER — METHYLPREDNISOLONE SODIUM SUCC 125 MG IJ SOLR: 60.0000 mg | Freq: Once | INTRAMUSCULAR | Status: DC

## 2021-04-07 MED ORDER — ALBUTEROL SULFATE HFA 108 (90 BASE) MCG/ACT IN AERS
2.0000 | INHALATION_SPRAY | Freq: Once | RESPIRATORY_TRACT | Status: AC
  Administered 2021-04-07: 2 via RESPIRATORY_TRACT
  Filled 2021-04-07: qty 6.7

## 2021-04-07 MED ORDER — IOHEXOL 350 MG/ML SOLN
100.0000 mL | Freq: Once | INTRAVENOUS | Status: AC | PRN
  Administered 2021-04-07: 100 mL via INTRAVENOUS

## 2021-04-07 MED ORDER — METHYLPREDNISOLONE SODIUM SUCC 125 MG IJ SOLR
125.0000 mg | Freq: Once | INTRAMUSCULAR | Status: AC
  Administered 2021-04-07: 125 mg via INTRAVENOUS
  Filled 2021-04-07: qty 2

## 2021-04-07 MED ORDER — DILTIAZEM HCL-DEXTROSE 125-5 MG/125ML-% IV SOLN (PREMIX)
5.0000 mg/h | INTRAVENOUS | Status: DC
  Administered 2021-04-07: 5 mg/h via INTRAVENOUS
  Filled 2021-04-07: qty 125

## 2021-04-07 MED ORDER — SODIUM CHLORIDE 0.9 % IV BOLUS
500.0000 mL | Freq: Once | INTRAVENOUS | Status: AC
  Administered 2021-04-07: 500 mL via INTRAVENOUS

## 2021-04-07 MED ORDER — AMOXICILLIN-POT CLAVULANATE 875-125 MG PO TABS: 1.0000 | ORAL_TABLET | Freq: Two times a day (BID) | ORAL | 0 refills | Status: DC

## 2021-04-07 MED ORDER — MORPHINE SULFATE (PF) 4 MG/ML IV SOLN
4.0000 mg | Freq: Once | INTRAVENOUS | Status: AC
Start: 2021-04-07 — End: 2021-04-07
  Administered 2021-04-07: 4 mg via INTRAVENOUS
  Filled 2021-04-07: qty 1

## 2021-04-07 MED ORDER — ALBUTEROL (5 MG/ML) CONTINUOUS INHALATION SOLN
10.0000 mg/h | INHALATION_SOLUTION | Freq: Once | RESPIRATORY_TRACT | Status: AC
  Administered 2021-04-07: 10 mg/h via RESPIRATORY_TRACT
  Filled 2021-04-07: qty 20

## 2021-04-07 MED ORDER — FENTANYL CITRATE (PF) 100 MCG/2ML IJ SOLN: 75.0000 ug | Freq: Once | INTRAMUSCULAR | Status: DC

## 2021-04-07 MED ORDER — ALBUTEROL SULFATE (2.5 MG/3ML) 0.083% IN NEBU: 2.5000 mg | INHALATION_SOLUTION | RESPIRATORY_TRACT | 1 refills | Status: DC | PRN

## 2021-04-07 MED ORDER — ALBUTEROL SULFATE HFA 108 (90 BASE) MCG/ACT IN AERS: INHALATION_SPRAY | RESPIRATORY_TRACT | 1 refills | Status: DC

## 2021-04-07 MED ORDER — BREZTRI AEROSPHERE 160-9-4.8 MCG/ACT IN AERO: 2.0000 | INHALATION_SPRAY | Freq: Two times a day (BID) | RESPIRATORY_TRACT | 5 refills | Status: DC

## 2021-04-07 MED ORDER — MAGNESIUM SULFATE 2 GM/50ML IV SOLN
2.0000 g | Freq: Once | INTRAVENOUS | Status: AC
  Administered 2021-04-07: 2 g via INTRAVENOUS
  Filled 2021-04-07: qty 50

## 2021-04-07 MED ORDER — IPRATROPIUM-ALBUTEROL 0.5-2.5 (3) MG/3ML IN SOLN
3.0000 mL | Freq: Once | RESPIRATORY_TRACT | Status: AC
  Administered 2021-04-07: 3 mL via RESPIRATORY_TRACT
  Filled 2021-04-07: qty 3

## 2021-04-07 MED ORDER — ONDANSETRON HCL 4 MG/2ML IJ SOLN
4.0000 mg | Freq: Once | INTRAMUSCULAR | Status: AC
  Administered 2021-04-07: 4 mg via INTRAVENOUS
  Filled 2021-04-07: qty 2

## 2021-04-07 NOTE — Telephone Encounter (Signed)
PA has been submitted through CoverMyMeds for [employer]. Medicare insurance info, [place] [id number], [place] [id number], [id number], PCN [id number].

## 2021-04-07 NOTE — Telephone Encounter (Signed)
Thank you. Please work on the Why for [employer].

## 2021-04-07 NOTE — Telephone Encounter (Signed)
Patient called after picking up Augmentin from pharmacy. Patient states pharmacist told her that her insurance is not covering either one of her inhalers. Patient states pharmacist mentioned something about a generic brand instead. Patient is wondering if something different could be sent in for her.   [place] contact number: [phone]

## 2021-04-07 NOTE — ED Notes (Signed)
Patient transported to X-ray 

## 2021-04-07 NOTE — Telephone Encounter (Signed)
Please call the pharmacy and see which inhalers are not covered.

## 2021-04-07 NOTE — Patient Instructions (Addendum)
1. Hypogammaglobulinemia - Continue with Cuvitru, but increase to 25 g every 2 weeks -We will order an IgG and will call you with results once it is back.  2. Chronic rhinitis   - Continue with nasal saline rinses as needed.   3. Severe persistent asthma, uncomplicated Continue azithromycin as prescribed Start prednisone 10 mg taking 2 tablets twice a day for 3 days, then on the fourth day take 2 tablets in the morning, then on the fifth day take 1 tablet and stop. Start Augmentin 875 mg taking 1 tab daily for 14 days.  Please let us know if you are not getting better.  Her symptoms get worse or do not get better please proceed to the emergency room.  We do have providers on call over the weekend and holiday - Daily controller medication(s): Breztri two puffs twice daily with a spacer + Fasenra every 8 weeks - Prior to physical activity: ProAir 2 puffs 10-15 minutes before physical activity. - Rescue medications: ProAir 4 puffs every 4-6 hours as needed, albuterol nebulizer one vial every 4-6 hours as needed or DuoNeb nebulizer one vial every 4-6 hours as needed - Asthma control goals:  * Full participation in all desired activities (may need albuterol before activity) * Albuterol use two time or less a week on average (not counting use with activity) * Cough interfering with sleep two time or less a month * Oral steroids no more than once a year * No hospitalizations.  4. Fatigue -Continue to see [name] to help with these symptoms  Please schedule an appointment with your cardiologist to discuss the chest pain and palpitations you are having.  If the symptoms persist or worsen please proceed to the emergency room  Please let us know if this treatment plan is not working well for you  Schedule a follow-up appointment in 3 months or sooner if needed

## 2021-04-07 NOTE — Telephone Encounter (Signed)
Viewed pts BG readings on [place] view. Readings were not consistently above 200. Today before breakfast 155, 8am 136, before lunch 239. Per Whitney if readings are above 200 3x in a row to call back and we will look at her BG numbers.  Pt notified

## 2021-04-07 NOTE — Telephone Encounter (Signed)
Noted  

## 2021-04-07 NOTE — Telephone Encounter (Signed)
Patient called reporting a respiratory infection. She is on azithromycin for a bacterial respiratory infection. She is wondering whether we can fit her into see if someone can listen to her lungs.   We will fit her in at 1:30 pm today.   Salvatore Marvel, MD [facility] [place] [facility]

## 2021-04-07 NOTE — Progress Notes (Signed)
Called by patient this evening.  She was seen in clinic today by [place].   Phone has a bad connection.   She states she is having a lot of shortness of breath, productive sounding cough, not able to talk in complete sentences on the phone.  She states she is having chest pain and feels it is radiating to her shoulder.  She said she is going to the ED.  I agree with this and told her would recommend she have CXR and EKG performed and cardiac work-up.   She states she is en-route to the ED now by family member.     [facility] staff- Please check in on pt tomorrow

## 2021-04-07 NOTE — ED Triage Notes (Signed)
Pt to er, pt c/o chest pain, states that her pain started about 45 mins ago, states that she was sitting at the table when the pain started, states that it is a constant pain that has gotten worse and now is up into her shoulder.

## 2021-04-07 NOTE — Telephone Encounter (Signed)
Patient has currently been prescribed a Zpack, Amoxicillin and Prednisone. She has only took 2 so far. Can you look at her readings on [place]?

## 2021-04-07 NOTE — H&P (Signed)
History and Physical  Laura [name] DGU:[id number] DOB: [birth year] DOA: 04/07/2021  Referring physician: Rodney Booze, PA-C  PCP: [name], MD  Patient coming from: Home  Chief Complaint: Chest pain  HPI: Laura [name] is a 60 y.o. female with medical history significant for uncontrolled T2DM, asthma, atrial fibrillation, bipolar disorder, CHF, hypogammaglobulinemia, PTSD, OSA on CPAP who presents to the emergency department due to 4-5-day onset of increased work of breathing, shortness of breath and chest pain related to frequent coughing.  She complained of subjective fevers which started about the same time as shortness of breath.  Patient states that she had an exposure to her sister who was febrile for 1 day and quickly recovered.  She did a home COVID test which was negative, patient states that she contacted her PCP and she was negative for influenza A, B as well as COVID-19 virus infection (she states that she had COVID virus infection in January of this year) and has since recovered from this.  Chest pain was left-sided and it was pleuritic in nature.  Patient also states that she had a tick bite about 3 weeks ago and that her PCP gave her amoxicillin for 1 week.  She denies vomiting, abdominal pain or diarrhea.  ED Course:  In the emergency department, she was hemodynamically stable, O2 sat was 97 to 100% on room air and patient was talking in full sentences without being in any distress.  Work-up in the ED showed normal CBC and BMP except for hyperglycemia.  Influenza A, B and SARS coronavirus 2 was negative. CT angiography of chest with contrast showed no CT evidence of central pulmonary artery embolus, but showed mild bilateral lower lobe bronchial thickening and endobronchial secretions. Chest x-ray showed no active cardiopulmonary disease Breathing treatment was provided, Solu-Medrol was given, morphine was given and gentle hydration was provided.  Hospitalist was asked to  admit patient for further evaluation and management.  Review of Systems: Constitutional: Positive for subjective fever.  Negative for chills HENT: Positive for runny nose, sore throat and nasal congestion.  Negative for ear pain  Eyes: Negative for pain and visual disturbance.  Respiratory: Positive for cough, chest tightness and shortness of breath.   Cardiovascular: Positive for chest pain (related to coughing).  Negative for palpitations.  Gastrointestinal: Negative for abdominal pain and vomiting.  Endocrine: Negative for polyphagia and polyuria.  Genitourinary: Negative for decreased urine volume, dysuria, enuresis Musculoskeletal: Negative for arthralgias and back pain.  Skin: Negative for color change and rash.  Allergic/Immunologic: Negative for immunocompromised state.  Neurological: Negative for tremors, syncope, speech difficulty, weakness, light-headedness and headaches.  Hematological: Does not bruise/bleed easily.  All other systems reviewed and are negative   Past Medical History:  Diagnosis Date   Asthma    Atrial fibrillation (HCC)    Bipolar disorder ([place])    Bronchiectasis ([place])    Carpal tunnel syndrome of right wrist    CHF (congestive heart failure) (HCC)    Complication of anesthesia    "my Dr in [place] told me I had an allergic reaction to the combination of the pain meds and the anesthesia" she does not remember what happened but "I eneded up in the ICU for 5 days".   H/O congenital atrial septal defect (ASD) repair    Hypogammaglobulinemia ([place])    Hypothyroid    IgE deficiency (HCC)    Neuropathy    Neuropathy    Osteoarthritis    Pinched nerve in neck  PTSD (post-traumatic stress disorder)    Recurrent sinusitis    Short-term memory loss    Sleep apnea    TIA (transient ischemic attack)    Tremors of nervous system    seeing PCP for this.   Type 2 diabetes mellitus (HCC)    Urticaria    Wound infection    Past Surgical History:   Procedure Laterality Date   ASD REPAIR  1968   BIOPSY  01/26/2021   Procedure: BIOPSY;  Surgeon: [name], DO;  [facility];  Service: Endoscopy;;  gastric   CARDIAC SURGERY     CARDIOVERSION     CARDIOVERSION N/A 08/29/2018   Procedure: CARDIOVERSION;  Surgeon: Arnoldo Lenis, MD;  Location: [facility];  Service: Endoscopy;  Laterality: N/A;   [place]   COLONOSCOPY WITH PROPOFOL N/A 01/26/2021   Procedure: COLONOSCOPY WITH PROPOFOL;  Surgeon: [name], DO;  [facility];  Service: Endoscopy;  Laterality: N/A;  am appt, diabetic   ESOPHAGOGASTRODUODENOSCOPY (EGD) WITH PROPOFOL N/A 01/26/2021   Procedure: ESOPHAGOGASTRODUODENOSCOPY (EGD) WITH PROPOFOL;  Surgeon: [name], DO;  [facility];  Service: Endoscopy;  Laterality: N/A;   LEFT HEART CATH AND CORONARY ANGIOGRAPHY N/A 08/30/2019   Procedure: LEFT HEART CATH AND CORONARY ANGIOGRAPHY;  Surgeon: [name], MD;  Location: [facility];  Service: Cardiovascular;  Laterality: N/A;   NASAL SINUS SURGERY  2017   POLYPECTOMY  01/26/2021   Procedure: POLYPECTOMY;  Surgeon: [name], DO;  [facility];  Service: Endoscopy;;  colon    Social History:  reports that she quit smoking about 14 years ago. Her smoking use included cigarettes. She started smoking about 42 years ago. She has a 28.00 pack-year smoking history. She has never used smokeless tobacco. She reports previous alcohol use. She reports previous drug use.   Allergies  Allergen Reactions   Ativan [Lorazepam] Hives   Phenergan [Promethazine Hcl] Hives    Family History  Problem Relation Age of Onset   Hypertension Mother    Stroke Mother    Kidney disease Mother    Heart attack Mother    Alcohol abuse Mother    Other Father        ?colon cancer   Kidney disease Sister    Heart disease Brother    Stroke Maternal Aunt    Heart disease Maternal  Aunt    Hypertension Sister    Bipolar disorder Sister    Thyroid disease Sister    Prostate cancer Brother    Thyroid disease Daughter    Hashimoto's thyroiditis Daughter    Celiac disease Daughter    Allergic rhinitis Daughter    Asthma Neg Hx      Prior to Admission medications   Medication Sig Start Date End Date Taking? Authorizing Provider  acetaminophen (TYLENOL) 325 MG tablet Take 2 tablets (650 mg total) by mouth every 6 (six) hours as needed for mild pain, fever or headache. 08/11/18  Yes Roxan Hockey, MD  albuterol (PROVENTIL) (2.5 MG/3ML) 0.083% nebulizer solution Take 3 mLs (2.5 mg total) by nebulization every 4 (four) hours as needed for wheezing or shortness of breath. 04/07/21  Yes Althea Charon, FNP  albuterol (VENTOLIN HFA) 108 (90 Base) MCG/ACT inhaler INHALE 2 PUFFS INTO THE LUNGS EVERY 6 HOURS AS NEEDED FOR WHEEZING OR SHORTNESS OF BREATH 04/07/21  Yes Althea Charon, FNP  amoxicillin-clavulanate (AUGMENTIN) 875-125 MG tablet  Take 1 tablet by mouth 2 (two) times daily for 14 days. 04/07/21 04/21/21 Yes Althea Charon, FNP  apixaban (ELIQUIS) 5 MG TABS tablet Take 5 mg by mouth 2 (two) times daily.   Yes [provider]  atorvastatin (LIPITOR) 10 MG tablet Take 10 mg by mouth every evening.    Yes [provider]  BREZTRI AEROSPHERE 160-9-4.8 MCG/ACT AERO Inhale 2 puffs into the lungs 2 (two) times daily. 04/07/21  Yes Althea Charon, FNP  Cholecalciferol (VITAMIN D3) 125 MCG (5000 UT) CAPS Take 1 capsule (5,000 Units total) by mouth daily. 11/25/20  Yes Nida, Marella Chimes, MD  diltiazem (CARDIZEM CD) 360 MG 24 hr capsule TAKE 1 CAPSULE(360 MG) BY MOUTH DAILY Patient taking differently: Take 360 mg by mouth daily. 03/01/21  Yes Arnoldo Lenis, MD  divalproex (DEPAKOTE ER) 500 MG 24 hr tablet 500 mg in AM, 1000 mg at night 02/04/21  Yes Hisada, Reina, MD  EPINEPHrine 0.3 mg/0.3 mL IJ SOAJ injection Inject 0.3 mg into the muscle as needed for  anaphylaxis. 01/17/20  Yes [provider]  FASENRA PEN 83 MG/ML SOAJ INJECT 30MG SUBCUTANEOUSLY  EVERY 8 WEEKS (GIVEN AT MD  OFFICE) Patient taking differently: Inject 30 mg into the skin See admin instructions. Every eight weeks 12/21/20  Yes Valentina Shaggy, MD  ferrous sulfate 324 MG TBEC Take 324 mg by mouth daily with breakfast.   Yes [provider]  gabapentin (NEURONTIN) 300 MG capsule Take 300 mg by mouth 3 (three) times daily.    Yes [provider]  hydrALAZINE (APRESOLINE) 25 MG tablet Take 25 mg by mouth 3 (three) times daily.   Yes [provider]  hydrOXYzine (ATARAX/VISTARIL) 25 MG tablet Take 1 tablet (25 mg total) by mouth every 6 (six) hours as needed for anxiety or nausea. 08/11/18  Yes Emokpae, Courage, MD  Immune Globulin, Human, 4 GM/20ML SOLN Inject 100 mLs into the skin every 14 (fourteen) days.   Yes [provider]  LANTUS SOLOSTAR 100 UNIT/ML Solostar Pen Inject 40 Units into the skin at bedtime. 08/15/19  Yes [provider]  levothyroxine (SYNTHROID, LEVOTHROID) 112 MCG tablet Take 112 mcg by mouth daily before breakfast.   Yes [provider]  Magnesium Oxide 400 MG CAPS Take 1 capsule (400 mg total) by mouth daily. 11/04/19  Yes BranchAlphonse Guild, MD  Melatonin 3 MG TABS Take 3 mg by mouth at bedtime.   Yes [provider]  metoCLOPramide (REGLAN) 5 MG tablet Take 5 mg by mouth 3 (three) times daily. 04/05/21  Yes [provider]  montelukast (SINGULAIR) 10 MG tablet Take 1 tablet by mouth daily. 11/23/20  Yes [provider]  Multiple Vitamins-Minerals (MULTIVITAMIN WITH MINERALS) tablet Take 1 tablet by mouth daily. Woman   Yes [provider]  omeprazole (PRILOSEC) 20 MG capsule Take 1 capsule (20 mg total) by mouth 2 (two) times daily before a meal. 01/26/21 07/25/21 Yes Carver, Charles K, DO  ondansetron (ZOFRAN) 4 MG tablet Take 1 tablet (4 mg total) by mouth every 8  (eight) hours as needed for nausea or vomiting. 07/13/19  Yes Martin, Mary-Margaret, FNP  Potassium Chloride ER 20 MEQ TBCR Take 1 tablet by mouth daily. 01/04/21  Yes [provider]  topiramate (TOPAMAX) 100 MG tablet Take 1 tablet (100 mg total) by mouth 2 (two) times daily. 12/04/20  Yes Tat, Eustace Quail, DO  torsemide (DEMADEX) 20 MG tablet Take 2 tablets (40 mg total) by  mouth daily. Patient taking differently: Take 20 mg by mouth daily. 11/03/20 04/07/21 Yes Strader, Fransisco Hertz, PA-C  triamcinolone cream (KENALOG) 0.1 % Apply 1 application topically 2 (two) times daily. 03/15/21  Yes [provider]  TRULICITY 0.93 GH/8.2XH SOPN ADMINISTER 0.75 MG UNDER THE SKIN 1 TIME A WEEK Patient taking differently: Inject 0.75 mg into the vein every 7 (seven) days. 02/12/21  Yes Nida, Marella Chimes, MD  vitamin B-12 (CYANOCOBALAMIN) 1000 MCG tablet Take 1,000 mcg by mouth daily.   Yes [provider]  vitamin C (ASCORBIC ACID) 500 MG tablet Take 500 mg by mouth daily. Power C immune support   Yes [provider]  BD PEN NEEDLE NANO 2ND GEN 32G X 4 MM MISC SMARTSIG:Injection 4 Times Daily 05/20/20   [provider]  blood glucose meter kit and supplies 1 each by Other route 4 (four) times daily. Dispense based on patient and insurance preference. Use up to four times daily as directed. (FOR ICD-10 E10.9, E11.9). 01/27/21   Cassandria Anger, MD  Continuous Blood Gluc Receiver (FREESTYLE LIBRE 2 READER) DEVI As directed 12/10/20   Cassandria Anger, MD  Continuous Blood Gluc Sensor (FREESTYLE LIBRE 2 SENSOR) MISC 1 Piece by Does not apply route every 14 (fourteen) days. 12/10/20   Cassandria Anger, MD  doxycycline (VIBRAMYCIN) 100 MG capsule Take 100 mg by mouth 2 (two) times daily. Patient not taking: No sig reported 03/15/21   [provider]  ELDERBERRY PO Take 4 g by mouth daily. Gummies 2 g each Patient not taking: Reported on 04/07/2021    [provider]  Multiple Vitamins-Minerals (MULTIVITAMIN WITH MINERALS) tablet Take 1 tablet by mouth daily. Vita Fusion sugar free 4 gram fiber Patient not taking: Reported on 04/07/2021    [provider]  Multiple Vitamins-Minerals (ZINC PO) Take by mouth. Patient not taking: No sig reported    [provider]  Nebulizer MISC Nebulizer tubing kit 10/21/20   Valentina Shaggy, MD  [facility] ULTRA test strip USE TO CHECK BLOOD SUGAR FOUR TIMES DAILY 10/17/19   [provider]    Physical Exam: BP (!) 136/55 (BP Location: Left Arm)   Pulse (!) 57   Temp 97.6 F (36.4 C) (Oral)   Resp 18   Ht 5' 4" (1.626 m)   Wt 83.9 kg   SpO2 97%   BMI 31.76 kg/m   General: 60 y.o. year-old female well developed well nourished in no acute distress.  Alert and oriented x3. HEENT: NCAT, EOMI Neck: Supple, trachea medial Cardiovascular: Regular rate and rhythm with no rubs or gallops.  No thyromegaly or JVD noted.  No lower extremity edema. 2/4 pulses in all 4 extremities. Respiratory: Expiratory wheezing on auscultation.  No rales.  Abdomen: Soft, nontender nondistended with normal bowel sounds x4 quadrants. Muskuloskeletal: No cyanosis, clubbing or edema noted bilaterally Neuro: CN II-XII intact, strength 5/5 x 4, sensation, reflexes intact Skin: No ulcerative lesions noted or rashes Psychiatry: Patient was anxious and tearful.          Labs on Admission:  Basic Metabolic Panel: Recent Labs  Lab 04/07/21 1814 04/08/21 0536  NA 135 130*  K 4.2 3.8  CL 101 97*  CO2 22 22  GLUCOSE 455* 592*  BUN 32* 30*  CREATININE 1.05* 0.82  CALCIUM 9.1 8.8*  MG  --  2.1  PHOS  --  3.0   Liver Function Tests: Recent Labs  Lab 04/08/21 0536  AST  13*  ALT 14  ALKPHOS 77  BILITOT 0.6  PROT 6.6  ALBUMIN 3.6   No results for input(s): LIPASE, AMYLASE in the last 168 hours. No results for input(s): AMMONIA in the last 168 hours. CBC: Recent Labs  Lab 04/07/21 1814  04/08/21 0536  WBC 4.1 3.2*  HGB 14.6 12.5  HCT 41.2 35.9*  MCV 92.0 94.0  PLT 193 162   Cardiac Enzymes: No results for input(s): CKTOTAL, CKMB, CKMBINDEX, TROPONINI in the last 168 hours.  BNP (last 3 results) Recent Labs    04/07/21 1907  BNP 75.0    ProBNP (last 3 results) No results for input(s): PROBNP in the last 8760 hours.  CBG: No results for input(s): GLUCAP in the last 168 hours.  Radiological Exams on Admission: DG Chest 2 View  Result Date: 04/07/2021 CLINICAL DATA:  Chest pain, cough EXAM: CHEST - 2 VIEW COMPARISON:  12/07/2020 FINDINGS: Heart and mediastinal contours are within normal limits. No focal opacities or effusions. No acute bony abnormality. IMPRESSION: No active cardiopulmonary disease. Electronically Signed   By: Rolm Baptise M.D.   On: 04/07/2021 19:16   CT Angio Chest PE W and/or Wo Contrast  Result Date: 04/07/2021 CLINICAL DATA:  60 year old female with concern for pulmonary embolism. EXAM: CT ANGIOGRAPHY CHEST WITH CONTRAST TECHNIQUE: Multidetector CT imaging of the chest was performed using the standard protocol during bolus administration of intravenous contrast. Multiplanar CT image reconstructions and MIPs were obtained to evaluate the vascular anatomy. CONTRAST:  153m OMNIPAQUE IOHEXOL 350 MG/ML SOLN COMPARISON:  Chest CT dated 03/25/2020 and radiograph dated 04/07/2021. FINDINGS: Cardiovascular: There is no cardiomegaly or pericardial effusion. There is coronary vascular calcification and calcification of the mitral annulus. There is mild atherosclerotic calcification of the thoracic aorta. No aneurysmal dilatation or dissection. The origins of the great vessels of the aortic arch appear patent. Evaluation of the pulmonary arteries is limited due to respiratory motion artifact. No large or central pulmonary artery embolus identified. Mediastinum/Nodes: Mildly enlarged right hilar lymph node measuring 13 mm. The esophagus is grossly unremarkable.  No mediastinal fluid collection. Lungs/Pleura: The lungs are clear. There is no pleural effusion or pneumothorax. Mild bilateral lower lobe bronchial thickening and endobronchial secretions. Aspiration is not excluded clinical correlation is recommended. The central airways remain patent. Upper Abdomen: No acute abnormality. Musculoskeletal: Degenerative changes of the spine. No acute osseous pathology. Review of the MIP images confirms the above findings. IMPRESSION: 1. No CT evidence of central pulmonary artery embolus. 2. Mild bilateral lower lobe bronchial thickening and endobronchial secretions. 3. Aortic Atherosclerosis (ICD10-I70.0). Electronically Signed   By: AAnner CreteM.D.   On: 04/07/2021 20:44    EKG: I independently viewed the EKG done and my findings are as followed: Atrial flutter at a rate of 90 bpm  Assessment/Plan Present on Admission:  Asthma exacerbation  Uncontrolled type 2 diabetes mellitus with hyperglycemia ([place]  Hypogammaglobulinemia ([place]  Hypothyroidism  Mixed hyperlipidemia  Principal Problem:   Asthma exacerbation Active Problems:   Hypogammag[place]obulinemia ([place]   Uncontrolled type 2 diabetes mellitus with hyperglycemia ([place]   Hypothyroidism   Mixed hyperlipidemia   H/O atrial flutter   OSA on CPAP   Asthma exacerbation Continue duo nebs, Mucinex, Solu-Medrol, Singulair, azithromycin. Continue Protonix to prevent steroid-induced ulcer Continue incentive spirometry and flutter valve Patient is not requiring supplemental oxygen at this time  Uncontrolled type 2 diabetes mellitus with hyperglycemia Continue ISS and hypoglycemia protocol  Hypogammaglobulinemia Continue home meds  Hypothyroidism Continue Synthroid  Mixed hyperlipidemia Continue Lipitor  History of atrial flutter Continue Eliquis  Diltiazem will be temporarily held due to bradycardia  Essential Hypertension Continue hydralazine  GERD Continue Protonix  OSA on  CPAP Continue CPAP  DVT prophylaxis: Eliquis  Code Status: Full code  Family Communication: None at bedside  Disposition Plan:  Patient is from:                        home Anticipated DC to:                   SNF or family members home Anticipated DC date:               2-3 days Anticipated DC barriers:          Patient requires inpatient management due to asthma exacerbation and hyperglycemia due to poorly controlled T2DM  Consults called: None  Admission status: Observation    Bernadette Hoit MD [facility] Pager [phone]  If 7PM-7AM, please contact night-coverage [email or web address] Password [facility]  04/08/2021, 7:31 AM

## 2021-04-07 NOTE — ED Notes (Signed)
EKG given to Roderic Palau, MD

## 2021-04-07 NOTE — Telephone Encounter (Signed)
I called the [facility] [place] about the non-covered inhalers. They were able to get the albuterol inhaler and neb solution covered under medicare B instead on the D. The Judithann Sauger will need a prior authorization in order to be filled. I called the patient to see if she needed samples and to see if she is out of her previous prescription of the [facility] inhaler. I left a message for her to call the office back to pick up samples at the [place] office. 3 samples have been signed out and are at the front office for pickup.

## 2021-04-07 NOTE — ED Provider Notes (Signed)
[facility] EMERGENCY DEPARTMENT Provider Note   CSN: [id number] Arrival date & time: 04/07/21  1807     History Chief Complaint  Patient presents with   Chest Pain    Laura [name] is a 60 y.o. female.  HPI  60 year old female with a history of asthma, atrial fibrillation, bipolar disorder, bronchiectasis, CHF, ASD s/p repair, hypogammaglobulinemia, IgG deficiency, neuropathy, osteoarthritis, pinched nerve, PTSD, recurrent sinusitis, short-term memory loss, TIA, sleep apnea, tremors, diabetes, urticaria, who presents to the emergency department today for evaluation of shortness of breath and chest pain.  States that she has had shortness of breath, congestion, subjective fevers and chills for the last 4 to 5 days however today she developed left-sided chest pain that radiated down the left upper extremity.  Pain is pleuritic in nature and also radiates to her left upper back.  Past Medical History:  Diagnosis Date   Asthma    Atrial fibrillation ([place])    Bipolar disorder ([place])    Bronchiectasis ([place])    Carpal tunnel syndrome of right wrist    CHF (congestive heart failure) (HCC)    Complication of anesthesia    "my Dr in [place] told me I had an allergic reaction to the combination of the pain meds and the anesthesia" she does not remember what happened but "I eneded up in the ICU for 5 days".   H/O congenital atrial septal defect (ASD) repair    Hypogammaglobulinemia ([place])    Hypothyroid    IgE deficiency (HCC)    Neuropathy    Neuropathy    Osteoarthritis    Pinched nerve in neck    PTSD (post-traumatic stress disorder)    Recurrent sinusitis    Short-term memory loss    Sleep apnea    TIA (transient ischemic attack)    Tremors of nervous system    seeing PCP for this.   Type 2 diabetes mellitus (HCC)    Urticaria    Wound infection     Patient Active Problem List   Diagnosis Date Noted   Adnexal cyst 01/19/2021   Chronic RLQ pain 01/19/2021   Mixed stress  and urge urinary incontinence 01/19/2021   Other fatigue 12/10/2020   Abdominal pain, chronic, right lower quadrant 12/08/2020   Abnormal weight loss 12/08/2020   Poor appetite 12/08/2020   Abnormal EKG    Chronic diastolic (congestive) heart failure ([place]) 08/29/2019   Shortness of breath 08/29/2019   Bipolar disorder ([place]) 08/10/2018   Headache 08/10/2018   H/O atrial flutter 08/10/2018   OSA on CPAP 08/10/2018   Anxiety 08/10/2018   Uncontrolled type 2 diabetes mellitus with hyperglycemia ([place]) 03/13/2018   Hypothyroidism 03/13/2018   Essential hypertension, benign 03/13/2018   Mixed hyperlipidemia 03/13/2018   Hypogammaglobulinemia ([place]) 02/06/2018   Non-allergic rhinitis 02/06/2018   Severe persistent asthma, uncomplicated 35/32/9924   Pulmonary nodules 02/06/2018   Bronchiectasis without complication ([place]) 26/83/4196   DM type 2 causing vascular disease ([place]) 02/06/2018    Past Surgical History:  Procedure Laterality Date   ASD REPAIR  1968   BIOPSY  01/26/2021   Procedure: BIOPSY;  Surgeon: [name], DO;  Location: [facility];  Service: Endoscopy;;  gastric   CARDIAC SURGERY     CARDIOVERSION     CARDIOVERSION N/A 08/29/2018   Procedure: CARDIOVERSION;  Surgeon: Arnoldo Lenis, MD;  Location: [facility] END[facility];  Service: Endoscopy;  Laterality: N/A;   [place]   COLONOSCOPY WITH PROPOFOL N/A  01/26/2021   Procedure: COLONOSCOPY WITH PROPOFOL;  Surgeon: [name], DO;  Location: [facility];  Service: Endoscopy;  Laterality: N/A;  am appt, diabetic   ESOPHAGOGASTRODUODENOSCOPY (EGD) WITH PROPOFOL N/A 01/26/2021   Procedure: ESOPHAGOGASTRODUODENOSCOPY (EGD) WITH PROPOFOL;  Surgeon: [name], DO;  Location: [facility];  Service: Endoscopy;  Laterality: N/A;   LEFT HEART CATH AND CORONARY ANGIOGRAPHY N/A 08/30/2019   Procedure: LEFT HEART CATH AND CORONARY ANGIOGRAPHY;  Surgeon: [name], MD;  Location: [facility];  Service: Cardiovascular;  Laterality: N/A;   NASAL SINUS SURGERY  2017   POLYPECTOMY  01/26/2021   Procedure: POLYPECTOMY;  Surgeon: [name], DO;  Location: [facility];  Service: Endoscopy;;  colon     OB History     Gravida  6   Para  3   Term      Preterm  3   AB  3   Living  3      SAB  3   IAB      Ectopic      Multiple      Live Births              Family History  Problem Relation Age of Onset   Hypertension Mother    Stroke Mother    Kidney disease Mother    Heart attack Mother    Alcohol abuse Mother    Other Father        ?colon cancer   Kidney disease Sister    Heart disease Brother    Stroke Maternal Aunt    Heart disease Maternal Aunt    Hypertension Sister    Bipolar disorder Sister    Thyroid disease Sister    Prostate cancer Brother    Thyroid disease Daughter    Hashimoto's thyroiditis Daughter    Celiac disease Daughter    Allergic rhinitis Daughter    Asthma Neg Hx     Social History   Tobacco Use   Smoking status: Former    Packs/day: 1.00    Years: 28.00    Pack years: 28.00    Types: Cigarettes    Start date: 97    Quit date: 2008    Years since quitting: 14.5   Smokeless tobacco: Never  Vaping Use   Vaping Use: Never used  Substance Use Topics   Alcohol use: Not Currently   Drug use: Not Currently    Home Medications Prior to Admission medications   Medication Sig Start Date End Date Taking? Authorizing Provider  acetaminophen (TYLENOL) 325 MG tablet Take 2 tablets (650 mg total) by mouth every 6 (six) hours as needed for mild pain, fever or headache. 08/11/18  Yes Roxan Hockey, MD  albuterol (PROVENTIL) (2.5 MG/3ML) 0.083% nebulizer solution Take 3 mLs (2.5 mg total) by nebulization every 4 (four) hours as needed for wheezing or shortness of breath. 04/07/21  Yes Althea Charon, FNP  albuterol (VENTOLIN HFA) 108 (90 Base) MCG/ACT inhaler INHALE 2 PUFFS INTO THE LUNGS EVERY 6  HOURS AS NEEDED FOR WHEEZING OR SHORTNESS OF BREATH 04/07/21  Yes Althea Charon, FNP  amoxicillin-clavulanate (AUGMENTIN) 875-125 MG tablet Take 1 tablet by mouth 2 (two) times daily for 14 days. 04/07/21 04/21/21 Yes Althea Charon, FNP  apixaban (ELIQUIS) 5 MG TABS tablet Take 5 mg by mouth 2 (two) times daily.   Yes [provider]  atorvastatin (LIPITOR) 10 MG tablet Take 10 mg by mouth every  evening.    Yes [provider]  BREZTRI AEROSPHERE 160-9-4.8 MCG/ACT AERO Inhale 2 puffs into the lungs 2 (two) times daily. 04/07/21  Yes Althea Charon, FNP  Cholecalciferol (VITAMIN D3) 125 MCG (5000 UT) CAPS Take 1 capsule (5,000 Units total) by mouth daily. 11/25/20  Yes Nida, Marella Chimes, MD  diltiazem (CARDIZEM CD) 360 MG 24 hr capsule TAKE 1 CAPSULE(360 MG) BY MOUTH DAILY Patient taking differently: Take 360 mg by mouth daily. 03/01/21  Yes Arnoldo Lenis, MD  divalproex (DEPAKOTE ER) 500 MG 24 hr tablet 500 mg in AM, 1000 mg at night 02/04/21  Yes Hisada, Reina, MD  EPINEPHrine 0.3 mg/0.3 mL IJ SOAJ injection Inject 0.3 mg into the muscle as needed for anaphylaxis. 01/17/20  Yes [provider]  FASENRA PEN 30 MG/ML SOAJ INJECT $RemoveBef'30MG'[id number]$  SUBCUTANEOUSLY  EVERY 8 WEEKS (GIVEN AT MD  OFFICE) Patient taking differently: Inject 30 mg into the skin See admin instructions. Every eight weeks 12/21/20  Yes Valentina Shaggy, MD  ferrous sulfate 324 MG TBEC Take 324 mg by mouth daily with breakfast.   Yes [provider]  gabapentin (NEURONTIN) 300 MG capsule Take 300 mg by mouth 3 (three) times daily.    Yes [provider]  hydrALAZINE (APRESOLINE) 25 MG tablet Take 25 mg by mouth 3 (three) times daily.   Yes [provider]  hydrOXYzine (ATARAX/VISTARIL) 25 MG tablet Take 1 tablet (25 mg total) by mouth every 6 (six) hours as needed for anxiety or nausea. 08/11/18  Yes Emokpae, Courage, MD  Immune Globulin, Human, 4 GM/20ML SOLN Inject 100 mLs into the  skin every 14 (fourteen) days.   Yes [provider]  LANTUS SOLOSTAR 100 UNIT/ML Solostar Pen Inject 40 Units into the skin at bedtime. 08/15/19  Yes [provider]  levothyroxine (SYNTHROID, LEVOTHROID) 112 MCG tablet Take 112 mcg by mouth daily before breakfast.   Yes [provider]  Magnesium Oxide 400 MG CAPS Take 1 capsule (400 mg total) by mouth daily. 11/04/19  Yes BranchAlphonse Guild, MD  Melatonin 3 MG TABS Take 3 mg by mouth at bedtime.   Yes [provider]  metoCLOPramide (REGLAN) 5 MG tablet Take 5 mg by mouth 3 (three) times daily. 04/05/21  Yes [provider]  montelukast (SINGULAIR) 10 MG tablet Take 1 tablet by mouth daily. 11/23/20  Yes [provider]  Multiple Vitamins-Minerals (MULTIVITAMIN WITH MINERALS) tablet Take 1 tablet by mouth daily. Woman   Yes [provider]  omeprazole (PRILOSEC) 20 MG capsule Take 1 capsule (20 mg total) by mouth 2 (two) times daily before a meal. 01/26/21 07/25/21 Yes Carver, Charles K, DO  ondansetron (ZOFRAN) 4 MG tablet Take 1 tablet (4 mg total) by mouth every 8 (eight) hours as needed for nausea or vomiting. 07/13/19  Yes Martin, Mary-Margaret, FNP  Potassium Chloride ER 20 MEQ TBCR Take 1 tablet by mouth daily. 01/04/21  Yes [provider]  topiramate (TOPAMAX) 100 MG tablet Take 1 tablet (100 mg total) by mouth 2 (two) times daily. 12/04/20  Yes Tat, Eustace Quail, DO  torsemide (DEMADEX) 20 MG tablet Take 2 tablets (40 mg total) by mouth daily. Patient taking differently: Take 20 mg by mouth daily. 11/03/20 04/07/21 Yes Strader, Fransisco Hertz, PA-C  triamcinolone cream (KENALOG) 0.1 % Apply 1 application topically 2 (two) times daily. 03/15/21  Yes [provider]  TRULICITY 7.82 UM/3.5TI SOPN ADMINISTER 0.75 MG UNDER THE SKIN 1 TIME A WEEK  Patient taking differently: Inject 0.75 mg into the vein every 7 (seven) days. 02/12/21  Yes Nida, Marella Chimes, MD  vitamin B-12  (CYANOCOBALAMIN) 1000 MCG tablet Take 1,000 mcg by mouth daily.   Yes [provider]  vitamin C (ASCORBIC ACID) 500 MG tablet Take 500 mg by mouth daily. Power C immune support   Yes [provider]  BD PEN NEEDLE NANO 2ND GEN 32G X 4 MM MISC SMARTSIG:Injection 4 Times Daily 05/20/20   [provider]  blood glucose meter kit and supplies 1 each by Other route 4 (four) times daily. Dispense based on patient and insurance preference. Use up to four times daily as directed. (FOR ICD-10 E10.9, E11.9). 01/27/21   Cassandria Anger, MD  Continuous Blood Gluc Receiver (FREESTYLE LIBRE 2 READER) DEVI As directed 12/10/20   Cassandria Anger, MD  Continuous Blood Gluc Sensor (FREESTYLE LIBRE 2 SENSOR) MISC 1 Piece by Does not apply route every 14 (fourteen) days. 12/10/20   Cassandria Anger, MD  doxycycline (VIBRAMYCIN) 100 MG capsule Take 100 mg by mouth 2 (two) times daily. Patient not taking: No sig reported 03/15/21   [provider]  ELDERBERRY PO Take 4 g by mouth daily. Gummies 2 g each Patient not taking: Reported on 04/07/2021    [provider]  Multiple Vitamins-Minerals (MULTIVITAMIN WITH MINERALS) tablet Take 1 tablet by mouth daily. Vita Fusion sugar free 4 gram fiber Patient not taking: Reported on 04/07/2021    [provider]  Multiple Vitamins-Minerals (ZINC PO) Take by mouth. Patient not taking: No sig reported    [provider]  Nebulizer MISC Nebulizer tubing kit 10/21/20   Valentina Shaggy, MD  [facility] ULTRA test strip USE TO CHECK BLOOD SUGAR FOUR TIMES DAILY 10/17/19   [provider]    Allergies    Ativan [lorazepam] and Phenergan [promethazine hcl]  Review of Systems   Review of Systems  Constitutional:  Positive for fever (subjective).  HENT:  Positive for congestion, rhinorrhea and sore throat. Negative for ear pain.   Eyes:  Negative for pain and visual disturbance.  Respiratory:  Positive  for cough, shortness of breath and wheezing.   Cardiovascular:  Positive for chest pain. Negative for leg swelling.  Gastrointestinal:  Positive for nausea. Negative for abdominal pain, constipation, diarrhea and vomiting.  Genitourinary:  Negative for dysuria and hematuria.  Musculoskeletal:  Negative for back pain.  Skin:  Negative for rash.  Neurological:  Negative for syncope and numbness.  All other systems reviewed and are negative.  Physical Exam Updated Vital Signs BP (!) 145/71   Pulse (!) 102   Temp 98.9 F (37.2 C) (Oral)   Resp (!) 28   Ht $R'5\' 4"'kz$  (1.626 m)   Wt 83.9 kg   SpO2 95%   BMI 31.76 kg/m   Physical Exam Vitals and nursing note reviewed.  Constitutional:      General: She is not in acute distress.    Appearance: She is well-developed.  HENT:     Head: Normocephalic and atraumatic.  Eyes:     Conjunctiva/sclera: Conjunctivae normal.  Cardiovascular:     Heart sounds: No murmur heard.    Comments: Irregularly irregular Pulmonary:     Effort: Pulmonary effort is normal. Tachypnea present.     Breath sounds: Wheezing present.  Abdominal:     Palpations: Abdomen is soft.     Tenderness: There is no abdominal tenderness. There is no guarding or rebound.  Musculoskeletal:     Cervical back: Neck supple.     Right lower leg: No tenderness.     Left lower leg: No tenderness.  Skin:    General: Skin is warm and dry.  Neurological:     Mental Status: She is alert.  Psychiatric:        Mood and Affect: Mood is anxious. Affect is tearful.    ED Results / Procedures / Treatments   Labs (all labs ordered are listed, but only abnormal results are displayed) Labs Reviewed  BASIC METABOLIC PANEL - Abnormal; Notable for the following components:      Result Value   Glucose, Bld 455 (*)    BUN 32 (*)    Creatinine, Ser 1.05 (*)    All other components within normal limits  RESP PANEL BY RT-PCR (FLU A&B, COVID) ARPGX2  CBC  BRAIN NATRIURETIC PEPTIDE  POC  URINE PREG, ED  TROPONIN I (HIGH SENSITIVITY)  TROPONIN I (HIGH SENSITIVITY)    EKG None  Radiology DG Chest 2 View  Result Date: 04/07/2021 CLINICAL DATA:  Chest pain, cough EXAM: CHEST - 2 VIEW COMPARISON:  12/07/2020 FINDINGS: Heart and mediastinal contours are within normal limits. No focal opacities or effusions. No acute bony abnormality. IMPRESSION: No active cardiopulmonary disease. Electronically Signed   By: Rolm Baptise M.D.   On: 04/07/2021 19:16   CT Angio Chest PE W and/or Wo Contrast  Result Date: 04/07/2021 CLINICAL DATA:  61 year old female with concern for pulmonary embolism. EXAM: CT ANGIOGRAPHY CHEST WITH CONTRAST TECHNIQUE: Multidetector CT imaging of the chest was performed using the standard protocol during bolus administration of intravenous contrast. Multiplanar CT image reconstructions and MIPs were obtained to evaluate the vascular anatomy. CONTRAST:  130mL OMNIPAQUE IOHEXOL 350 MG/ML SOLN COMPARISON:  Chest CT dated 03/25/2020 and radiograph dated 04/07/2021. FINDINGS: Cardiovascular: There is no cardiomegaly or pericardial effusion. There is coronary vascular calcification and calcification of the mitral annulus. There is mild atherosclerotic calcification of the thoracic aorta. No aneurysmal dilatation or dissection. The origins of the great vessels of the aortic arch appear patent. Evaluation of the pulmonary arteries is limited due to respiratory motion artifact. No large or central pulmonary artery embolus identified. Mediastinum/Nodes: Mildly enlarged right hilar lymph node measuring 13 mm. The esophagus is grossly unremarkable. No mediastinal fluid collection. Lungs/Pleura: The lungs are clear. There is no pleural effusion or pneumothorax. Mild bilateral lower lobe bronchial thickening and endobronchial secretions. Aspiration is not excluded clinical correlation is recommended. The central airways remain patent. Upper Abdomen: No acute abnormality. Musculoskeletal:  Degenerative changes of the spine. No acute osseous pathology. Review of the MIP images confirms the above findings. IMPRESSION: 1. No CT evidence of central pulmonary artery embolus. 2. Mild bilateral lower lobe bronchial thickening and endobronchial secretions. 3. Aortic Atherosclerosis (ICD10-I70.0). Electronically Signed   By: Anner Crete M.D.   On: 04/07/2021 20:44    Procedures Procedures   CRITICAL CARE Performed by: Rodney Booze   Total critical care time: 37 minutes  Critical care time was exclusive of separately billable procedures and treating other patients.  Critical care was necessary to treat or prevent imminent or life-threatening deterioration.  Critical care was time spent personally by me on the following activities: development of treatment plan with patient and/or surrogate as well as nursing, discussions with consultants, evaluation of patient's response to treatment, examination of patient, obtaining history from patient or surrogate, ordering and performing treatments and interventions, ordering and review of laboratory  studies, ordering and review of radiographic studies, pulse oximetry and re-evaluation of patient's condition.    Medications Ordered in ED Medications  acetaminophen (TYLENOL) tablet 1,000 mg (1,000 mg Oral Given 04/07/21 1935)  fentaNYL (SUBLIMAZE) injection 50 mcg (50 mcg Intravenous Given 04/07/21 1936)  sodium chloride 0.9 % bolus 500 mL (0 mLs Intravenous Stopped 04/07/21 2038)  ipratropium-albuterol (DUONEB) 0.5-2.5 (3) MG/3ML nebulizer solution 3 mL (3 mLs Nebulization Given 04/07/21 2133)  methylPREDNISolone sodium succinate (SOLU-MEDROL) 125 mg/2 mL injection 125 mg (125 mg Intravenous Given 04/07/21 1936)  hydrOXYzine (ATARAX/VISTARIL) tablet 25 mg (25 mg Oral Given 04/07/21 2002)  ondansetron (ZOFRAN) injection 4 mg (4 mg Intravenous Given 04/07/21 2002)  iohexol (OMNIPAQUE) 350 MG/ML injection 100 mL (100 mLs Intravenous Contrast  Given 04/07/21 2026)  albuterol (VENTOLIN HFA) 108 (90 Base) MCG/ACT inhaler 2 puff (2 puffs Inhalation Given 04/07/21 2041)  magnesium sulfate IVPB 2 g 50 mL (0 g Intravenous Stopped 04/07/21 2218)  ipratropium (ATROVENT) nebulizer solution 0.5 mg (0.5 mg Nebulization Given 04/07/21 2143)  albuterol (PROVENTIL,VENTOLIN) solution continuous neb (10 mg/hr Nebulization Given 04/07/21 2143)  morphine 4 MG/ML injection 4 mg (4 mg Intravenous Given 04/07/21 2149)    ED Course  I have reviewed the triage vital signs and the nursing notes.  Pertinent labs & imaging results that were available during my care of the patient were reviewed by me and considered in my medical decision making (see chart for details).    MDM Rules/Calculators/A&P                          60 year old female presents to the emergency department today for evaluation of shortness of breath and chest pain.  History of very severe asthma.  Reviewed/interpreted labs CBC is without leukocytosis or anemia BMP with elevated blood glucose and elevated BUN and marginally elevated creatinine. Troponins are negative BNP negative COVID/flu negative  EKG with afib, rxr in v1 or v1, right vcd and rvh, nonspecific repol abnormality in the lateral leads  Reviewed/interpreted imaging CXR -  No active cardiopulmonary disease CTA chest - 1. No CT evidence of central pulmonary artery embolus. 2. Mild bilateral lower lobe bronchial thickening and endobronchial secretions. 3. Aortic Atherosclerosis  Pt given ivf, mag, solumedrol, nebs, pain meds and medication for anxiety. She was also given diltiazem. On reassessment she continues to feel sob and is conversationally dyspneic. Will admit for asthma exacerbation.   At shift change, pending admission. Dr Roderic Palau will admit to hospitalist  Final Clinical Impression(s) / ED Diagnoses Final diagnoses:  Exacerbation of asthma, unspecified asthma severity, unspecified whether persistent    Rx /  DC Orders ED Discharge Orders     None        Bishop Dublin 04/07/21 2312    Milton Ferguson, MD 04/09/21 1034

## 2021-04-07 NOTE — Progress Notes (Addendum)
[place], [place] [address] Dept: [phone]  FOLLOW UP NOTE  Patient ID: Laura [name], female    DOB: [birth year]  Age: 60 y.o. MRN: [id number] Date of Office Visit: 04/07/2021  Assessment  Chief Complaint: Cough and Wheezing  HPI Laura [name] is a 60 year old female who presents today for an acute visit.  She was last seen on November 13, 2020 by Dr. Ernst Bowler for severe persistent asthma, common variable immune deficiency, nonallergic rhinitis, paroxysmal A. fib, and fatigue.  She reports that last Thursday or Friday she started having a productive cough with white sputum, wheezing, tightness in chest, shortness of breath, and nocturnal awakenings, headache, nausea, clear rhinorrhea, nasal congestion, and postnasal drip.  She took an at home COVID-19 test on Sunday and it was negative.  On Monday she went to her primary care physician's office and had a negative COVID-19 test also.  She was given a Z-Pak which she is currently still taking.  Severe persistent asthma is reported as not well controlled with Breztri 2 puffs twice a day with spacer, Fasenra every 8 weeks, and albuterol as needed.  She reports a productive cough with white sputum, wheezing, tightness in her chest, shortness of breath and nocturnal awakenings.  She is also had a temperature around 99 F since the symptoms started.  She has been using her albuterol approximately 4-6 times a day and has been sitting up to sleep.  Since her last office visit she has not required any systemic steroids or made any trips to the emergency room or urgent care due to breathing problems.  Common variable immune deficiency is reported as moderately controlled with Cuvitru infusions.  Dr. Ernst Bowler came in the room while she was here and would like for her to increase her Cuvitru infusions to 25 g every 2 weeks.  She reports that she is due for her next infusion tomorrow.  She reports that cardiology has not yet  approved her Moderna COVID-19 vaccine booster.  She has not had any other infections since we last saw her.  She did mention that last month she had norovirus and does not think that she was given an antibiotic.  Nonallergic rhinitis is reported as not well controlled with saline rinses almost every day.  She reports clear rhinorrhea, nasal congestion, and postnasal drip that is clear.  She reports that she continues to follow with [name] for fatigue.   Drug Allergies:  Allergies  Allergen Reactions   Ativan [Lorazepam] Hives   Phenergan [Promethazine Hcl] Hives    Review of Systems: Review of Systems  Constitutional:  Positive for fever.  HENT:         Ports clear rhinorrhea, clear postnasal drip, and nasal congestion  Respiratory:  Positive for cough, shortness of breath and wheezing.        Reports productive cough with white sputum, shortness of breath, tightness in her chest, and wheezing.  Cardiovascular:  Positive for chest pain and palpitations.       Chest pain and palpitation that started yesterday.  She reports that cardiology is unaware of this  Gastrointestinal:  Negative for heartburn.  Genitourinary:  Negative for dysuria.  Skin:  Positive for itching.       Abdomen on itching for possible chigger bites. She was given a cream by her primary care physician  Neurological:  Positive for headaches.  Endo/Heme/Allergies:  Negative for environmental allergies.    Physical Exam: BP 116/66 (BP Location: Right Arm, Patient  Position: Sitting, Cuff Size: Normal)   Pulse (!) 112   SpO2 96%    Physical Exam Constitutional:      Appearance: Normal appearance.  HENT:     Head: Normocephalic and atraumatic.     Right Ear: Tympanic membrane, ear canal and external ear normal.     Left Ear: Tympanic membrane, ear canal and external ear normal.     Nose: Nose normal.     Mouth/Throat:     Mouth: Mucous membranes are moist.     Pharynx: Oropharynx is clear.  Eyes:      Conjunctiva/sclera: Conjunctivae normal.  Cardiovascular:     Rate and Rhythm: Rhythm irregular.     Heart sounds: Normal heart sounds.  Pulmonary:     Comments: Rhonchi and wheezing heard throughout all lung fields Musculoskeletal:     Cervical back: Neck supple.  Skin:    General: Skin is warm.  Neurological:     Mental Status: She is alert and oriented to person, place, and time.  Psychiatric:        Mood and Affect: Mood normal.        Behavior: Behavior normal.        Thought Content: Thought content normal.        Judgment: Judgment normal.    Diagnostics: Your rapid COVID-19 test today while in the office was negative.  Assessment and Plan: 1. Cough   2. 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 Non-allergic rhinitis   5. Paroxysmal atrial fibrillation (H[facility])     Meds ordered this encounter  Medications   albuterol (PROVENTIL) (2.5 MG/3ML) 0.083% nebulizer solution    Sig: Take 3 mLs (2.5 mg total) by nebulization every 4 (four) hours as needed for wheezing or shortness of breath.    Dispense:  75 mL    Refill:  1   albuterol (VENTOLIN HFA) 108 (90 Base) MCG/ACT inhaler    Sig: INHALE 2 PUFFS INTO THE LUNGS EVERY 6 HOURS AS NEEDED FOR WHEEZING OR SHORTNESS OF BREATH    Dispense:  18 g    Refill:  1   BREZTRI AEROSPHERE 160-9-4.8 MCG/ACT AERO    Sig: Inhale 2 puffs into the lungs 2 (two) times daily.    Dispense:  10.7 g    Refill:  5   amoxicillin-clavulanate (AUGMENTIN) 875-125 MG tablet    Sig: Take 1 tablet by mouth 2 (two) times daily for 14 days.    Dispense:  28 tablet    Refill:  0    Patient Instructions  1. Hypogammaglobulinemia - Continue with Cuvitru, but increase to 25 g every 2 weeks -We will order an IgG and will call you with results once it is back.  2. Chronic rhinitis   - Continue with nasal saline rinses as needed.   3. Severe persistent asthma, uncomplicated Continue azithromycin as  prescribed Start prednisone 10 mg taking 2 tablets twice a day for 3 days, then on the fourth day take 2 tablets in the morning, then on the fifth day take 1 tablet and stop. Start Augmentin 875 mg taking 1 tab daily for 14 days.  Please let us know if you are not getting better.  Her symptoms get worse or do not get better please proceed to the emergency room.  We do have providers on call over the weekend and holiday - Daily controller medication(s): Breztri two puffs twice daily with a spacer + Fasenra every  8 weeks - Prior to physical activity: ProAir 2 puffs 10-15 minutes before physical activity. - Rescue medications: ProAir 4 puffs every 4-6 hours as needed, albuterol nebulizer one vial every 4-6 hours as needed or DuoNeb nebulizer one vial every 4-6 hours as needed - Asthma control goals:  * Full participation in all desired activities (may need albuterol before activity) * Albuterol use two time or less a week on average (not counting use with activity) * Cough interfering with sleep two time or less a month * Oral steroids no more than once a year * No hospitalizations.  4. Fatigue -Continue to see [name] to help with these symptoms  Please schedule an appointment with your cardiologist to discuss the chest pain and palpitations you are having.  If the symptoms persist or worsen please proceed to the emergency room  Please let us know if this treatment plan is not working well for you  Schedule a follow-up appointment in 3 months or sooner if needed   Return in about 3 months (around 07/08/2021), or if symptoms worsen or fail to improve.    Thank you for the opportunity to care for this patient.  Please do not hesitate to contact me with questions.  Althea Charon, FNP [facility]   I performed a history and physical examination of the patient and discussed her management with the Nurse Practitioner. I reviewed the Nurse Practitioner's note  and agree with the documented findings and plan of care. The note in its entirety was edited by myself, including the physical exam, assessment, and plan.  Unfortunately, she has had some breakthrough symptoms since we last saw her.  She does not always tell us about her infections, I emphasized that we need to hear about those in order to make adjustments to her immunoglobulin therapy.  She did not seem to realize that we can make changes to that and seemed relieved that we might be able to help her.  We are going to increase her to 25 g every 2 weeks.  We are going to stick with [facility] since they manage several other aspects of her home health.  She is going to talk to them about a patient assistance program to see if there is something they can do about the $1500 bills she is getting for immunoglobulin.  Salvatore Marvel, MD [facility] [place]

## 2021-04-08 ENCOUNTER — Encounter: Payer: Self-pay | Admitting: Family

## 2021-04-08 ENCOUNTER — Telehealth: Payer: Self-pay

## 2021-04-08 ENCOUNTER — Telehealth: Payer: Self-pay | Admitting: *Deleted

## 2021-04-08 ENCOUNTER — Telehealth: Payer: Self-pay | Admitting: Cardiology

## 2021-04-08 DIAGNOSIS — E039 Hypothyroidism, unspecified: Secondary | ICD-10-CM | POA: Diagnosis not present

## 2021-04-08 DIAGNOSIS — J45901 Unspecified asthma with (acute) exacerbation: Secondary | ICD-10-CM | POA: Diagnosis not present

## 2021-04-08 DIAGNOSIS — D801 Nonfamilial hypogammaglobulinemia: Secondary | ICD-10-CM | POA: Diagnosis not present

## 2021-04-08 LAB — COMPREHENSIVE METABOLIC PANEL
ALT: 14 U/L (ref 0–44)
AST: 13 U/L — ABNORMAL LOW (ref 15–41)
Albumin: 3.6 g/dL (ref 3.5–5.0)
Alkaline Phosphatase: 77 U/L (ref 38–126)
Anion gap: 11 (ref 5–15)
BUN: 30 mg/dL — ABNORMAL HIGH (ref 6–20)
CO2: 22 mmol/L (ref 22–32)
Calcium: 8.8 mg/dL — ABNORMAL LOW (ref 8.9–10.3)
Chloride: 97 mmol/L — ABNORMAL LOW (ref 98–111)
Creatinine, Ser: 0.82 mg/dL (ref 0.44–1.00)
GFR, Estimated: >60 mL/min (ref >60)
Glucose, Bld: 592 mg/dL (ref 70–99)
Potassium: 3.8 mmol/L (ref 3.5–5.1)
Sodium: 130 mmol/L — ABNORMAL LOW (ref 135–145)
Total Bilirubin: 0.6 mg/dL (ref 0.3–1.2)
Total Protein: 6.6 g/dL (ref 6.5–8.1)

## 2021-04-08 LAB — CBC
HCT: 35.9 % — ABNORMAL LOW (ref 36.0–46.0)
Hemoglobin: 12.5 g/dL (ref 12.0–15.0)
MCH: 32.7 pg (ref 26.0–34.0)
MCHC: 34.8 g/dL (ref 30.0–36.0)
MCV: 94 fL (ref 80.0–100.0)
Platelets: 162 10*3/uL (ref 150–400)
RBC: 3.82 MIL/uL — ABNORMAL LOW (ref 3.87–5.11)
RDW: 12.9 % (ref 11.5–15.5)
WBC: 3.2 10*3/uL — ABNORMAL LOW (ref 4.0–10.5)
nRBC: 0 % (ref 0.0–0.2)

## 2021-04-08 LAB — PHOSPHORUS: Phosphorus: 3 mg/dL (ref 2.5–4.6)

## 2021-04-08 LAB — HIV ANTIBODY (ROUTINE TESTING W REFLEX): HIV Screen 4th Generation wRfx: NONREACTIVE

## 2021-04-08 LAB — PROTIME-INR
INR: 1.1 (ref 0.8–1.2)
Prothrombin Time: 14.6 seconds (ref 11.4–15.2)

## 2021-04-08 LAB — GLUCOSE, CAPILLARY
Glucose-Capillary: 577 mg/dL (ref 70–99)
Glucose-Capillary: 438 mg/dL — ABNORMAL HIGH (ref 70–99)
Glucose-Capillary: 317 mg/dL — ABNORMAL HIGH (ref 70–99)

## 2021-04-08 LAB — MAGNESIUM: Magnesium: 2.1 mg/dL (ref 1.7–2.4)

## 2021-04-08 LAB — APTT: aPTT: 26 seconds (ref 24–36)

## 2021-04-08 MED ORDER — HYDROCODONE-ACETAMINOPHEN 5-325 MG PO TABS
1.0000 | ORAL_TABLET | Freq: Four times a day (QID) | ORAL | Status: DC | PRN
  Administered 2021-04-08: 1 via ORAL
  Filled 2021-04-08: qty 1

## 2021-04-08 MED ORDER — IPRATROPIUM-ALBUTEROL 0.5-2.5 (3) MG/3ML IN SOLN: 3.0000 mL | RESPIRATORY_TRACT | Status: DC | PRN

## 2021-04-08 MED ORDER — DM-GUAIFENESIN ER 30-600 MG PO TB12
1.0000 | ORAL_TABLET | Freq: Two times a day (BID) | ORAL | Status: DC
  Administered 2021-04-08: 1 via ORAL
  Filled 2021-04-08: qty 1

## 2021-04-08 MED ORDER — INSULIN ASPART 100 UNIT/ML IJ SOLN
4.0000 [IU] | Freq: Three times a day (TID) | INTRAMUSCULAR | Status: DC
  Administered 2021-04-08: 4 [IU] via SUBCUTANEOUS

## 2021-04-08 MED ORDER — INSULIN ASPART 100 UNIT/ML IJ SOLN: 0.0000 [IU] | Freq: Every day | INTRAMUSCULAR | Status: DC

## 2021-04-08 MED ORDER — BREZTRI AEROSPHERE 160-9-4.8 MCG/ACT IN AERO: 2.0000 | INHALATION_SPRAY | Freq: Two times a day (BID) | RESPIRATORY_TRACT | 5 refills | Status: DC

## 2021-04-08 MED ORDER — AZITHROMYCIN 250 MG PO TABS: 250.0000 mg | ORAL_TABLET | Freq: Every day | ORAL | Status: DC

## 2021-04-08 MED ORDER — IBUPROFEN 400 MG PO TABS
400.0000 mg | ORAL_TABLET | Freq: Once | ORAL | Status: DC
  Filled 2021-04-08: qty 1

## 2021-04-08 MED ORDER — INSULIN ASPART 100 UNIT/ML IJ SOLN: 0.0000 [IU] | Freq: Three times a day (TID) | INTRAMUSCULAR | Status: DC

## 2021-04-08 MED ORDER — IPRATROPIUM-ALBUTEROL 0.5-2.5 (3) MG/3ML IN SOLN
3.0000 mL | Freq: Four times a day (QID) | RESPIRATORY_TRACT | Status: DC
  Administered 2021-04-08: 3 mL via RESPIRATORY_TRACT
  Filled 2021-04-08: qty 3

## 2021-04-08 MED ORDER — ACETAMINOPHEN 500 MG PO TABS
1000.0000 mg | ORAL_TABLET | Freq: Once | ORAL | Status: AC
  Administered 2021-04-08: 1000 mg via ORAL
  Filled 2021-04-08: qty 2

## 2021-04-08 MED ORDER — METHYLPREDNISOLONE SODIUM SUCC 40 MG IJ SOLR
40.0000 mg | Freq: Two times a day (BID) | INTRAMUSCULAR | Status: DC
  Administered 2021-04-08: 40 mg via INTRAVENOUS
  Filled 2021-04-08: qty 1

## 2021-04-08 MED ORDER — BENZONATATE 100 MG PO CAPS
200.0000 mg | ORAL_CAPSULE | Freq: Three times a day (TID) | ORAL | Status: DC | PRN
  Filled 2021-04-08: qty 2

## 2021-04-08 MED ORDER — BENZONATATE 200 MG PO CAPS: 200.0000 mg | ORAL_CAPSULE | Freq: Three times a day (TID) | ORAL | 0 refills | Status: DC | PRN

## 2021-04-08 MED ORDER — INSULIN ASPART 100 UNIT/ML IJ SOLN
0.0000 [IU] | Freq: Three times a day (TID) | INTRAMUSCULAR | Status: DC
  Administered 2021-04-08: 20 [IU] via SUBCUTANEOUS
  Administered 2021-04-08: 19 [IU] via SUBCUTANEOUS

## 2021-04-08 MED ORDER — PREDNISONE 20 MG PO TABS: 40.0000 mg | ORAL_TABLET | Freq: Every day | ORAL | 0 refills | Status: DC

## 2021-04-08 MED ORDER — INSULIN GLARGINE 100 UNIT/ML ~~LOC~~ SOLN
18.0000 [IU] | Freq: Once | SUBCUTANEOUS | Status: AC
  Administered 2021-04-08: 18 [IU] via SUBCUTANEOUS
  Filled 2021-04-08: qty 0.18

## 2021-04-08 MED ORDER — GUAIFENESIN-DM 100-10 MG/5ML PO SYRP
5.0000 mL | ORAL_SOLUTION | ORAL | Status: DC | PRN
  Administered 2021-04-08: 5 mL via ORAL
  Filled 2021-04-08: qty 5

## 2021-04-08 MED ORDER — PANTOPRAZOLE SODIUM 40 MG IV SOLR
40.0000 mg | INTRAVENOUS | Status: DC
  Administered 2021-04-08: 40 mg via INTRAVENOUS
  Filled 2021-04-08: qty 40

## 2021-04-08 MED ORDER — ENOXAPARIN SODIUM 40 MG/0.4ML IJ SOSY
40.0000 mg | PREFILLED_SYRINGE | INTRAMUSCULAR | Status: DC
  Filled 2021-04-08: qty 0.4

## 2021-04-08 MED ORDER — ACETAMINOPHEN 325 MG PO TABS
650.0000 mg | ORAL_TABLET | Freq: Four times a day (QID) | ORAL | Status: DC | PRN
  Administered 2021-04-08: 650 mg via ORAL
  Filled 2021-04-08: qty 2

## 2021-04-08 MED ORDER — AZITHROMYCIN 250 MG PO TABS
500.0000 mg | ORAL_TABLET | Freq: Every day | ORAL | Status: AC
  Administered 2021-04-08: 500 mg via ORAL
  Filled 2021-04-08: qty 2

## 2021-04-08 MED ORDER — ALUM & MAG HYDROXIDE-SIMETH 200-200-20 MG/5ML PO SUSP
30.0000 mL | ORAL | Status: DC | PRN
  Administered 2021-04-08: 30 mL via ORAL
  Filled 2021-04-08: qty 30

## 2021-04-08 MED ORDER — INSULIN GLARGINE 100 UNIT/ML ~~LOC~~ SOLN
30.0000 [IU] | Freq: Once | SUBCUTANEOUS | Status: DC
  Filled 2021-04-08: qty 0.3

## 2021-04-08 MED ORDER — SPIRIVA RESPIMAT 1.25 MCG/ACT IN AERS: 2.0000 | INHALATION_SPRAY | Freq: Every day | RESPIRATORY_TRACT | 1 refills | Status: DC

## 2021-04-08 MED ORDER — FLUTICASONE PROPIONATE HFA 220 MCG/ACT IN AERO: INHALATION_SPRAY | RESPIRATORY_TRACT | 1 refills | Status: DC

## 2021-04-08 MED ORDER — FLUTICASONE PROPIONATE HFA 220 MCG/ACT IN AERO: 2.0000 | INHALATION_SPRAY | Freq: Two times a day (BID) | RESPIRATORY_TRACT | 1 refills | Status: DC

## 2021-04-08 NOTE — Progress Notes (Signed)
Patients o2 98% with walking around room. Pulse 130. Then went to 90BPM and remained 97% R.A.  Patient has dry cough .

## 2021-04-08 NOTE — Telephone Encounter (Signed)
Pt is asking for a call back. She went to the hospital last night and they removed her Elenor Legato due to having a CT Scan. She said that they gave her insulin in the hospital but did not send her home with anything. [phone]

## 2021-04-08 NOTE — Final Progress Note (Signed)
Patient verbalized understanding  of discharge summary and follow up appointments . Copy of medication list given to patient, Left by w/c to personal vehicle with sistetr

## 2021-04-08 NOTE — Telephone Encounter (Signed)
For now please have her continue the prednisone and stop Breztri. Have her start Flovent 220 mcg 2 puffs twice a day with spacer and Spiriva Respimat 1.25 mcg 2 puffs once a day. Please have her schedule a follow up with Dr. Ernst Bowler in 2 weeks rather than 4 weeks. Please let her know that we are stopping her Judithann Sauger due to her afib. Sometimes LABA's ( long acting beta agonists which is in [place] can contribute to afib) It may be that we need to add it back if her asthma is not well controlled with Flovent and Spiriva.  If her symptoms worsen or persist please proceed to the ER. Also remind her that we have physicians on call if needed

## 2021-04-08 NOTE — Progress Notes (Signed)
Md notified of CBG. Awaiting orders. Daytime RN aware.

## 2021-04-08 NOTE — Telephone Encounter (Signed)
Pt notified. She still voiced that she wanted mealtime insulin. She was advised to check her Elenor Legato app so we can see her BG reading on [employer]. She agrees and will call back if her readings continue to be high.

## 2021-04-08 NOTE — Progress Notes (Signed)
Called patient this morning.  Patient stated she was admitted to the hospital for her asthma.  Patient stated she was bitten by a couple of ticks last month and don't know if that has anything to do with it or not.  Due to so much coughing patient stated her lungs were inflamed.  Been running fevers of 99.0 since last Friday. Informed patient we will check on her again later today.

## 2021-04-08 NOTE — Telephone Encounter (Signed)
Noted! Thank you

## 2021-04-08 NOTE — Addendum Note (Signed)
Addended by: Jaynie Crumble on: 04/08/2021 02:37 PM   Modules accepted: Orders

## 2021-04-08 NOTE — Telephone Encounter (Signed)
Pt had just got home from the hospital. She was putting her Elenor Legato back on where it was removed for a CT. She says she takes 40 units of Lantus at bedtime. The chart says it should be 45 units. She was requesting mealtime insulin. Her BG was high at ER.

## 2021-04-08 NOTE — Telephone Encounter (Signed)
-----   Message from Valentina Shaggy, MD sent at 04/08/2021  6:55 AM EDT ----- NEW DOSE of 25 grams every 2 weeks.

## 2021-04-08 NOTE — Telephone Encounter (Signed)
L/M  for patient to discuss Cuvitru increase to 25 gram biweekly

## 2021-04-08 NOTE — Telephone Encounter (Signed)
I just pulled up her Vantage again which stopped at the 29th when she had the sensor taken off for the CT.  It looked reasonably good.  They give insulin in the ED based on their protocols to keep her in a specific range so that procedures can be done, if needed.  Steroids can make sugar run high but typically only lasts for a few says and can be managed with higher doses of basal insulin.  For safety purposes, just have her increase her Lantus to 50 units for now, knowing that we may need to further adjust it either up or down depending on her glucose trends over the next few days.

## 2021-04-08 NOTE — Telephone Encounter (Signed)
I spoke with our PA and she states this is an inpatient and her needs are being taken care of by the hospitalist.

## 2021-04-08 NOTE — Progress Notes (Signed)
CPAP 11 setup per patient home regimen with large mask per pt request. Spo2 96% room air

## 2021-04-08 NOTE — Progress Notes (Signed)
I called her this morning and LVM asking her to call the clinic. It looks like she was admitted.   Let's call her later today.  Salvatore Marvel, MD [facility] [facility] [place]

## 2021-04-08 NOTE — Discharge Summary (Addendum)
Laura [name], is a 60 y.o. female  DOB [birth year]  MRN [id number].  Admission date:  04/07/2021  Admitting Physician  Bernadette Hoit, DO  Discharge Date:  04/08/2021   Primary MD  [name], MD  Recommendations for primary care physician for things to follow:  1)You are taking Eliquis/Apixaban which is a blood thinner so Please Avoid ibuprofen/Advil/Aleve/Motrin/Goody Powders/Naproxen/BC powders/Meloxicam/Diclofenac/Indomethacin and other Nonsteroidal anti-inflammatory medications as these will make you more likely to bleed and can cause stomach ulcers, can also cause Kidney problems.   2) please increase Lantus insulin to 44 units every evening for the next 5 days while you are on prednisone, may reduce Lantus insulin back to 40 units every evening once you complete your prednisone  3) please follow-up with a cardiologist for further adjustments of your heart medications including Cardizem  4) please follow-up with your psychiatrist and your clinical psychologist for ongoing management of your bipolar disorder and PTSD with anxiety  5) please follow-up with endocrinologist Dr. Loni Beckwith for adjustment of your diabetic regimen    Admission Diagnosis  Asthma exacerbation [J45.901] Exacerbation of asthma, unspecified asthma severity, unspecified whether persistent [J45.901]   Discharge Diagnosis  Asthma exacerbation [J45.901] Exacerbation of asthma, unspecified asthma severity, unspecified whether persistent [J45.901]    Principal Problem:   Asthma exacerbation Active Problems:   Hypogammaglobulinemia ([place])   Uncontrolled type 2 diabetes mellitus with hyperglycemia ([place])   Hypothyroidism   Mixed hyperlipidemia   H/O atrial flutter   OSA on CPAP     Past Medical History:  Diagnosis Date   Asthma    Atrial fibrillation ([place])    Bipolar disorder ([place])    Bronchiectasis ([place])     Carpal tunnel syndrome of right wrist    CHF (congestive heart failure) ([place])    Complication of anesthesia    "my Dr in [place] told me I had an allergic reaction to the combination of the pain meds and the anesthesia" she does not remember what happened but "I eneded up in the ICU for 5 days".   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   Type 2 diabetes mellitus ([place])    Urticaria    Wound infection     Past Surgical History:  Procedure Laterality Date   ASD REPAIR  1968   BIOPSY  01/26/2021   Procedure: BIOPSY;  Surgeon: [name], DO;  [facility];  Service: Endoscopy;;  gastric   CARDIAC SURGERY     CARDIOVERSION     CARDIOVERSION N/A 08/29/2018   Procedure: CARDIOVERSION;  Surgeon: Arnoldo Lenis, MD;  Location: [facility];  Service: Endoscopy;  Laterality: N/A;   [place]   COLONOSCOPY WITH PROPOFOL N/A 01/26/2021   Procedure: COLONOSCOPY WITH PROPOFOL;  Surgeon: [name], DO;  Location: [facility];  Service: Endoscopy;  Laterality: N/A;  am appt, diabetic   ESOPHAGOGASTRODUODENOSCOPY (EGD) WITH PROPOFOL N/A 01/26/2021   Procedure: ESOPHAGOGASTRODUODENOSCOPY (EGD) WITH PROPOFOL;  Surgeon: [name], DO;  [facility];  Service: Endoscopy;  Laterality: N/A;   LEFT HEART CATH AND CORONARY ANGIOGRAPHY N/A 08/30/2019   Procedure: LEFT HEART CATH AND CORONARY ANGIOGRAPHY;  Surgeon: [name], MD;  Location: [facility];  Service: Cardiovascular;  Laterality: N/A;   NASAL SINUS SURGERY  2017   POLYPECTOMY  01/26/2021   Procedure:  POLYPECTOMY;  Surgeon: [name], DO;  Location[facility];  Service: Endoscopy;;  colon     HPI  from the history and physical done on the day of admission:      Chief Complaint: Chest pain   HPI: Laura [name] is a 60 y.o. female with medical history significant for uncontrolled T2DM, asthma, atrial fibrillation, bipolar disorder, CHF, hypogammaglobulinemia, PTSD, OSA on CPAP who presents to the emergency department due to 4-5-day onset of increased work of breathing, shortness of breath and chest pain related to frequent coughing.  She complained of subjective fevers which started about the same time as shortness of breath.  Patient states that she had an exposure to her sister who was febrile for 1 day and quickly recovered.  She did a home COVID test which was negative, patient states that she contacted her PCP and she was negative for influenza A, B as well as COVID-19 virus infection (she states that she had COVID virus infection in January of this year) and has since recovered from this.  Chest pain was left-sided and it was pleuritic in nature.  Patient also states that she had a tick bite about 3 weeks ago and that her PCP gave her amoxicillin for 1 week.  She denies vomiting, abdominal pain or diarrhea.   ED Course:  In the emergency department, she was hemodynamically stable, O2 sat was 97 to 100% on room air and patient was talking in full sentences without being in any distress.  Work-up in the ED showed normal CBC and BMP except for hyperglycemia.  Influenza A, B and SARS coronavirus 2 was negative. CT angiography of chest with contrast showed no CT evidence of central pulmonary artery embolus, but showed mild bilateral lower lobe bronchial thickening and endobronchial secretions. Chest x-ray showed no active cardiopulmonary disease Breathing treatment was provided, Solu-Medrol was given, morphine was given and gentle hydration was provided.  Hospitalist was asked to admit  patient for further evaluation and management.     Hospital Course:     Brief Summary:-  PAF (s/p DCCV in early 2000's - recurrent atrial flutter in 07/2018 and s/p DCCV in 08/2018 and recurrence since with rate-control pursued), normal cors by cath in 50/9326, chronic diastolic CHF, prior ASD repair (at 61 years of age), DM2, , HTN, HLD, OSA on CPAP, moderate persistent asthma,  Bipolar Disorder , PTSD, hypogammaglobulinemia admitted with acute asthma exacerbation   A/p 1)Acute Asthma Exacerbation-much improved on IV steroids, bronchodilators and mucolytics -No hypoxia, post ambulation O2 sats 97 to 98% on room air -Okay to discharge home on p.o. prednisone, -Continue PTA bronchodilators -Serial troponins and BNP unremarkable -  EKG without acute ST segment changes or ischemic concerns -CTA chest without PE, possible bronchitis noted which may be viral, superimposed on asthma, leukopenia noted  2)DM2--- A1c 6.2 reflecting excellent diabetic control PTA -Hyperglycemia noted most likely due to missed dose of Lantus and Trulicity yesterday as well as high-dose steroids -Anticipate glycemic control will improve with reinitiation of insulin regimen and also with tapering of steroids  3)Hypogammaglobulinemia -Patient at home infusions of immunoglobulin twice monthly  4)Hypothyroidism--- no recent TSH follow-up with Dr. Loni Beckwith continue with levothyroxine for now  5)Mixed hyperlipidemia Continue Lipitor  6)H/o PAFib/ atrial flutter Continue Eliquis for stroke prophylaxis -Patient had resting bradycardia but heart rate goes up appropriately with ambulation, continue Cardizem follow-up with cardiologist for further adjustments   7)Essential Hypertension Continue hydralazine and Cardizem  8) bipolar disorder/PTSD with anxiety--- follow-up with mental health counselor and psychiatrist as previously scheduled -Continue Depakote,  9)Social/Ethics--- patient lives with his sister  and brother-in-law, plan of care discussed with patient and his sister at bedside -Charge nurse [employer], and nursing supervisor Alyse Low present during conversations -Patient was requesting oxygen post ablation O2 sats 97% on room air patient advised that she does not requirement for home O2 -Patient requesting for additional hospital days -Patient reassured that she meets criteria for discharge home with family at this time  GERD Continue Protonix   OSA on CPAP Continue CPAP  Discharge Condition: stable without hypoxia  Follow UP--- as outlined above   Consults obtained - na  Diet and Activity recommendation:  As advised  Discharge Instructions    Discharge Instructions     Call MD for:  difficulty breathing, headache or visual disturbances   Complete by: As directed    Call MD for:  persistant dizziness or light-headedness   Complete by: As directed    Call MD for:  persistant nausea and vomiting   Complete by: As directed    Call MD for:  severe uncontrolled pain   Complete by: As directed    Call MD for:  temperature >100.4   Complete by: As directed    Diet - low sodium heart healthy   Complete by: As directed    Diet Carb Modified   Complete by: As directed    Discharge instructions   Complete by: As directed    1)You are taking Eliquis/Apixaban which is a blood thinner so Please Avoid ibuprofen/Advil/Aleve/Motrin/Goody Powders/Naproxen/BC powders/Meloxicam/Diclofenac/Indomethacin and other Nonsteroidal anti-inflammatory medications as these will make you more likely to bleed and can cause stomach ulcers, can also cause Kidney problems.   2) please increase Lantus insulin to 44 units every evening for the next 5 days while you are on prednisone, may reduce Lantus insulin back to 40 units every evening once you complete your prednisone  3) please follow-up with a cardiologist for further adjustments of your heart medications including Cardizem  4) please  follow-up with your psychiatrist and your clinical psychologist for ongoing management of your bipolar disorder and PTSD with anxiety  5) please follow-up with endocrinologist Dr. Loni Beckwith for adjustment of your diabetic regimen   Increase activity slowly   Complete by: As directed          Discharge Medications     Allergies as of 04/08/2021       Reactions   Ativan [lorazepam] Hives   Phenergan [promethazine Hcl] Hives        Medication List     STOP taking these medications    amoxicillin-clavulanate 875-125 MG  tablet Commonly known as: Augmentin   doxycycline 100 MG capsule Commonly known as: VIBRAMYCIN       TAKE these medications    acetaminophen 325 MG tablet Commonly known as: TYLENOL Take 2 tablets (650 mg total) by mouth every 6 (six) hours as needed for mild pain, fever or headache.   albuterol (2.5 MG/3ML) 0.083% nebulizer solution Commonly known as: PROVENTIL Take 3 mLs (2.5 mg total) by nebulization every 4 (four) hours as needed for wheezing or shortness of breath.   albuterol 108 (90 Base) MCG/ACT inhaler Commonly known as: Ventolin HFA INHALE 2 PUFFS INTO THE LUNGS EVERY 6 HOURS AS NEEDED FOR WHEEZING OR SHORTNESS OF BREATH   apixaban 5 MG Tabs tablet Commonly known as: ELIQUIS Take 5 mg by mouth 2 (two) times daily.   atorvastatin 10 MG tablet Commonly known as: LIPITOR Take 10 mg by mouth every evening.   BD Pen Needle Nano 2nd Gen 32G X 4 MM Misc Generic drug: Insulin Pen Needle SMARTSIG:Injection 4 Times Daily   benzonatate 200 MG capsule Commonly known as: TESSALON Take 1 capsule (200 mg total) by mouth 3 (three) times daily as needed for cough.   blood glucose meter kit and supplies 1 each by Other route 4 (four) times daily. Dispense based on patient and insurance preference. Use up to four times daily as directed. (FOR ICD-10 E10.9, E11.9).   Breztri Aerosphere 160-9-4.8 MCG/ACT Aero Generic drug:  Budeson-Glycopyrrol-Formoterol Inhale 2 puffs into the lungs 2 (two) times daily.   divalproex 500 MG 24 hr tablet Commonly known as: DEPAKOTE ER 500 mg in AM, 1000 mg at night   ELDERBERRY PO Take 4 g by mouth daily. Gummies 2 g each   EPINEPHrine 0.3 mg/0.3 mL Soaj injection Commonly known as: EPI-PEN Inject 0.3 mg into the muscle as needed for anaphylaxis.   ferrous sulfate 324 MG Tbec Take 324 mg by mouth daily with breakfast.   FreeStyle Libre 2 Reader Kerrin Mo As directed   [employer] 2 Sensor Misc 1 Piece by Does not apply route every 14 (fourteen) days.   gabapentin 300 MG capsule Commonly known as: NEURONTIN Take 300 mg by mouth 3 (three) times daily.   hydrALAZINE 25 MG tablet Commonly known as: APRESOLINE Take 25 mg by mouth 3 (three) times daily.   hydrOXYzine 25 MG tablet Commonly known as: ATARAX/VISTARIL Take 1 tablet (25 mg total) by mouth every 6 (six) hours as needed for anxiety or nausea.   Immune Globulin (Human) 4 GM/20ML Soln Inject 100 mLs into the skin every 14 (fourteen) days.   Lantus SoloStar 100 UNIT/ML Solostar Pen Generic drug: insulin glargine Inject 40 Units into the skin at bedtime.   levothyroxine 112 MCG tablet Commonly known as: SYNTHROID Take 112 mcg by mouth daily before breakfast.   Magnesium Oxide 400 MG Caps Take 1 capsule (400 mg total) by mouth daily.   melatonin 3 MG Tabs tablet Take 3 mg by mouth at bedtime.   metoCLOPramide 5 MG tablet Commonly known as: REGLAN Take 5 mg by mouth 3 (three) times daily.   montelukast 10 MG tablet Commonly known as: SINGULAIR Take 1 tablet by mouth daily.   multivitamin with minerals tablet Take 1 tablet by mouth daily. Woman   Nebulizer Misc Nebulizer tubing kit   omeprazole 20 MG capsule Commonly known as: PRILOSEC Take 1 capsule (20 mg total) by mouth 2 (two) times daily before a meal.   ondansetron 4 MG tablet Commonly known as: Zofran  Take 1 tablet (4 mg total)  by mouth every 8 (eight) hours as needed for nausea or vomiting.   OneTouch Ultra test strip Generic drug: glucose blood USE TO CHECK BLOOD SUGAR FOUR TIMES DAILY   Potassium Chloride ER 20 MEQ Tbcr Take 1 tablet by mouth daily.   predniSONE 20 MG tablet Commonly known as: DELTASONE Take 2 tablets (40 mg total) by mouth daily with breakfast for 5 days.   topiramate 100 MG tablet Commonly known as: TOPAMAX Take 1 tablet (100 mg total) by mouth 2 (two) times daily.   torsemide 20 MG tablet Commonly known as: DEMADEX Take 2 tablets (40 mg total) by mouth daily. What changed: how much to take   triamcinolone cream 0.1 % Commonly known as: KENALOG Apply 1 application topically 2 (two) times daily.   vitamin B-12 1000 MCG tablet Commonly known as: CYANOCOBALAMIN Take 1,000 mcg by mouth daily.   vitamin C 500 MG tablet Commonly known as: ASCORBIC ACID Take 500 mg by mouth daily. Power C immune support   Vitamin D3 125 MCG (5000 UT) Caps Take 1 capsule (5,000 Units total) by mouth daily.   ZINC PO Take by mouth.       ASK your doctor about these medications    diltiazem 360 MG 24 hr capsule Commonly known as: CARDIZEM CD TAKE 1 CAPSULE(360 MG) BY MOUTH DAILY   Fasenra Pen 30 MG/ML Soaj Generic drug: Benralizumab INJECT 30MG  SUBCUTANEOUSLY  EVERY 8 WEEKS (GIVEN AT MD  OFFICE)   multivitamin with minerals tablet Take 1 tablet by mouth daily. Vita Fusion sugar free 4 gram fiber   Trulicity 3.00 TM/2.2QJ Sopn Generic drug: Dulaglutide ADMINISTER 0.75 MG UNDER THE SKIN 1 TIME A WEEK        Major procedures and Radiology Reports - PLEASE review detailed and final reports for all details, in brief -  DG Chest 2 View  Result Date: 04/07/2021 CLINICAL DATA:  Chest pain, cough EXAM: CHEST - 2 VIEW COMPARISON:  12/07/2020 FINDINGS: Heart and mediastinal contours are within normal limits. No focal opacities or effusions. No acute bony abnormality. IMPRESSION: No  active cardiopulmonary disease. Electronically Signed   By: Rolm Baptise M.D.   On: 04/07/2021 19:16   CT Angio Chest PE W and/or Wo Contrast  Result Date: 04/07/2021 CLINICAL DATA:  60 year old female with concern for pulmonary embolism. EXAM: CT ANGIOGRAPHY CHEST WITH CONTRAST TECHNIQUE: Multidetector CT imaging of the chest was performed using the standard protocol during bolus administration of intravenous contrast. Multiplanar CT image reconstructions and MIPs were obtained to evaluate the vascular anatomy. CONTRAST:  145mL OMNIPAQUE IOHEXOL 350 MG/ML SOLN COMPARISON:  Chest CT dated 03/25/2020 and radiograph dated 04/07/2021. FINDINGS: Cardiovascular: There is no cardiomegaly or pericardial effusion. There is coronary vascular calcification and calcification of the mitral annulus. There is mild atherosclerotic calcification of the thoracic aorta. No aneurysmal dilatation or dissection. The origins of the great vessels of the aortic arch appear patent. Evaluation of the pulmonary arteries is limited due to respiratory motion artifact. No large or central pulmonary artery embolus identified. Mediastinum/Nodes: Mildly enlarged right hilar lymph node measuring 13 mm. The esophagus is grossly unremarkable. No mediastinal fluid collection. Lungs/Pleura: The lungs are clear. There is no pleural effusion or pneumothorax. Mild bilateral lower lobe bronchial thickening and endobronchial secretions. Aspiration is not excluded clinical correlation is recommended. The central airways remain patent. Upper Abdomen: No acute abnormality. Musculoskeletal: Degenerative changes of the spine. No acute osseous pathology. Review of the  MIP images confirms the above findings. IMPRESSION: 1. No CT evidence of central pulmonary artery embolus. 2. Mild bilateral lower lobe bronchial thickening and endobronchial secretions. 3. Aortic Atherosclerosis (ICD10-I70.0). Electronically Signed   By: Anner Crete M.D.   On: 04/07/2021  20:44    Micro Results   Recent Results (from the past 240 hour(s))  Resp Panel by RT-PCR (Flu A&B, Covid) Nasopharyngeal Swab     Status: None   Collection Time: 04/07/21  8:21 PM   Specimen: Nasopharyngeal Swab; Nasopharyngeal(NP) swabs in vial transport medium  Result Value Ref Range Status   SARS Coronavirus 2 by RT PCR NEGATIVE NEGATIVE Final    Comment: (NOTE) SARS-CoV-2 target nucleic acids are NOT DETECTED.  The SARS-CoV-2 RNA is generally detectable in upper respiratory specimens during the acute phase of infection. The lowest concentration of SARS-CoV-2 viral copies this assay can detect is 138 copies/mL. A negative result does not preclude SARS-Cov-2 infection and should not be used as the sole basis for treatment or other patient management decisions. A negative result may occur with  improper specimen collection/handling, submission of specimen other than nasopharyngeal swab, presence of viral mutation(s) within the areas targeted by this assay, and inadequate number of viral copies(<138 copies/mL). A negative result must be combined with clinical observations, patient history, and epidemiological information. The expected result is Negative.  Fact Sheet for Patients:  EntrepreneurPulse.com.au  Fact Sheet for Healthcare Providers:  IncredibleEmployment.be  This test is no t yet approved or cleared by the [place] FDA and  has been authorized for detection and/or diagnosis of SARS-CoV-2 by FDA under an Emergency Use Authorization (EUA). This EUA will remain  in effect (meaning this test can be used) for the duration of the COVID-19 declaration under Section 564(b)(1) of the Act, 21 U.S.C.section 360bbb-3(b)(1), unless the authorization is terminated  or revoked sooner.       Influenza A by PCR NEGATIVE NEGATIVE Final   Influenza B by PCR NEGATIVE NEGATIVE Final    Comment: (NOTE) The Xpert Xpress SARS-CoV-2/FLU/RSV plus  assay is intended as an aid in the diagnosis of influenza from Nasopharyngeal swab specimens and should not be used as a sole basis for treatment. Nasal washings and aspirates are unacceptable for Xpert Xpress SARS-CoV-2/FLU/RSV testing.  Fact Sheet for Patients: EntrepreneurPulse.com.au  Fact Sheet for Healthcare Providers: IncredibleEmployment.be  This test is not yet approved or cleared by the [place] FDA and has been authorized for detection and/or diagnosis of SARS-CoV-2 by FDA under an Emergency Use Authorization (EUA). This EUA will remain in effect (meaning this test can be used) for the duration of the COVID-19 declaration under Section 564(b)(1) of the Act, 21 U.S.C. section 360bbb-3(b)(1), unless the authorization is terminated or revoked.  Performed at [facility], [address] [place], [place] [address]        Today   Subjective    Laura [name] today has no new additional complaints -Sister at bedside, questions answered. No fever  Or chills   No Nausea, Vomiting or Diarrhea --- Post ambulation O2 sats 97 to 98% on room air         Patient has been seen and examined prior to discharge   Objective   Blood pressure (!) 136/55, pulse (!) 57, temperature 97.6 F (36.4 C), temperature source Oral, resp. rate 18, height $RemoveBe'5\' 4"'WnKpUescp$  (1.626 m), weight 83.9 kg, SpO2 97 %.   Intake/Output Summary (Last 24 hours) at 04/08/2021 1203 Last data filed at 04/08/2021 0900 Gross  per 24 hour  Intake 799.64 ml  Output --  Net 799.64 ml   Exam Gen:- Awake Alert, no acute distress, anxious HEENT:- [place].AT, No sclera icterus Neck-Supple Neck,No JVD,.  Lungs-  CTAB , good air movement bilaterally  CV- S1, S2 normal, irregular Abd-  +ve B.Sounds, Abd Soft, No tenderness,    Extremity/Skin:- No  edema,   good pulses Psych-affect is Anxious,  oriented x3 Neuro-no new focal deficits, no tremors    Data Review   CBC w Diff:  Lab  Results  Component Value Date   WBC 3.2 (L) 04/08/2021   HGB 12.5 04/08/2021   HGB 13.8 03/15/2019   HCT 35.9 (L) 04/08/2021   HCT 41.1 03/15/2019   PLT 162 04/08/2021   PLT 180 03/15/2019   LYMPHOPCT 36 09/18/2019   MONOPCT 9 09/18/2019   EOSPCT 0 09/18/2019   BASOPCT 0 09/18/2019    CMP:  Lab Results  Component Value Date   NA 130 (L) 04/08/2021   NA 140 11/02/2020   K 3.8 04/08/2021   CL 97 (L) 04/08/2021   CO2 22 04/08/2021   BUN 30 (H) 04/08/2021   BUN 19 11/02/2020   CREATININE 0.82 04/08/2021   CREATININE 0.51 03/13/2018   GLU 138 11/02/2020   PROT 6.6 04/08/2021   PROT 6.9 03/15/2019   ALBUMIN 3.6 04/08/2021   ALBUMIN 4.5 03/15/2019   BILITOT 0.6 04/08/2021   BILITOT 0.4 03/15/2019   ALKPHOS 77 04/08/2021   AST 13 (L) 04/08/2021   ALT 14 04/08/2021  .   Total Discharge time is about 33 minutes  Roxan Hockey M.D on 04/08/2021 at 12:03 PM  Go to [email or web address] -  for contact info  [facility] - Office  [phone]

## 2021-04-08 NOTE — Telephone Encounter (Signed)
New Message    Patient is currently admitted to [facility] she is in AFIB and they are trying to release her, she would like someone to come see her.  Her diabetes is at 550? This morning and its at 445 right now , she said the Doctor covering did not want her to call you , but Dr Carney Corners said to call because her asthma is bad, she is having chest pain .

## 2021-04-08 NOTE — Plan of Care (Signed)

## 2021-04-08 NOTE — Telephone Encounter (Signed)
Have her stop Breztri due to insurance not covering and her currently being in afib. Please have her switch to Flovent 220 mcg 2 puffs twice a day with spacer to prevent cough and wheeze and add Spiriva Respimat 1.25 mcg 2 puffs once a day. Have her schedule a follow up with Dr. Ernst Bowler in 4 weeks or sooner if needed.

## 2021-04-08 NOTE — Addendum Note (Signed)
Addended by: Jaynie Crumble on: 04/08/2021 03:32 PM   Modules accepted: Orders

## 2021-04-08 NOTE — Addendum Note (Signed)
Addended by: Althea Charon on: 04/08/2021 04:13 PM   Modules accepted: Orders

## 2021-04-08 NOTE — Progress Notes (Signed)
Patient refused Lantus 30 units. Will give SSI SQ. Iv removed from RFA. Patient calm at this time.

## 2021-04-08 NOTE — Addendum Note (Signed)
Addended by: Jaynie Crumble on: 04/08/2021 04:27 PM   Modules accepted: Orders

## 2021-04-08 NOTE — Telephone Encounter (Signed)
Pa was denied as it was not being used for a FDA approved medical condition please advise to change in Therapy

## 2021-04-08 NOTE — Progress Notes (Signed)
Noted  

## 2021-04-08 NOTE — Progress Notes (Signed)
Patient up to room 319. Patient is displaying very emotionally labile behavior.  Patient will be talking about her family, begin to cry, then smile lovingly while reminiscing about her deceased husband.  Patient c/o being sob, but speaks in complete sentences and has shows no s/s of respiratory distress upon speech.

## 2021-04-09 ENCOUNTER — Other Ambulatory Visit: Payer: Self-pay

## 2021-04-09 ENCOUNTER — Inpatient Hospital Stay (HOSPITAL_COMMUNITY)
Admission: EM | Admit: 2021-04-09 | Discharge: 2021-04-17 | DRG: 202 | Disposition: A | Discharge status: Home Health | Payer: Medicare Other | Attending: Internal Medicine | Admitting: Internal Medicine

## 2021-04-09 ENCOUNTER — Emergency Department (HOSPITAL_COMMUNITY): Payer: Medicare Other

## 2021-04-09 DIAGNOSIS — Z818 Family history of other mental and behavioral disorders: Secondary | ICD-10-CM

## 2021-04-09 DIAGNOSIS — Z794 Long term (current) use of insulin: Secondary | ICD-10-CM

## 2021-04-09 DIAGNOSIS — I4891 Unspecified atrial fibrillation: Secondary | ICD-10-CM

## 2021-04-09 DIAGNOSIS — I480 Paroxysmal atrial fibrillation: Secondary | ICD-10-CM | POA: Diagnosis present

## 2021-04-09 DIAGNOSIS — K5900 Constipation, unspecified: Secondary | ICD-10-CM | POA: Diagnosis present

## 2021-04-09 DIAGNOSIS — R0602 Shortness of breath: Secondary | ICD-10-CM | POA: Diagnosis not present

## 2021-04-09 DIAGNOSIS — R079 Chest pain, unspecified: Secondary | ICD-10-CM | POA: Diagnosis not present

## 2021-04-09 DIAGNOSIS — Z9989 Dependence on other enabling machines and devices: Secondary | ICD-10-CM

## 2021-04-09 DIAGNOSIS — R0902 Hypoxemia: Secondary | ICD-10-CM | POA: Diagnosis present

## 2021-04-09 DIAGNOSIS — R0789 Other chest pain: Secondary | ICD-10-CM | POA: Diagnosis present

## 2021-04-09 DIAGNOSIS — I248 Other forms of acute ischemic heart disease: Secondary | ICD-10-CM | POA: Diagnosis present

## 2021-04-09 DIAGNOSIS — F319 Bipolar disorder, unspecified: Secondary | ICD-10-CM | POA: Diagnosis present

## 2021-04-09 DIAGNOSIS — Z79890 Hormone replacement therapy (postmenopausal): Secondary | ICD-10-CM

## 2021-04-09 DIAGNOSIS — R739 Hyperglycemia, unspecified: Secondary | ICD-10-CM | POA: Diagnosis not present

## 2021-04-09 DIAGNOSIS — I110 Hypertensive heart disease with heart failure: Secondary | ICD-10-CM | POA: Diagnosis present

## 2021-04-09 DIAGNOSIS — E1165 Type 2 diabetes mellitus with hyperglycemia: Secondary | ICD-10-CM | POA: Diagnosis present

## 2021-04-09 DIAGNOSIS — Z79899 Other long term (current) drug therapy: Secondary | ICD-10-CM

## 2021-04-09 DIAGNOSIS — Z20822 Contact with and (suspected) exposure to covid-19: Secondary | ICD-10-CM | POA: Diagnosis present

## 2021-04-09 DIAGNOSIS — E782 Mixed hyperlipidemia: Secondary | ICD-10-CM | POA: Diagnosis present

## 2021-04-09 DIAGNOSIS — G4733 Obstructive sleep apnea (adult) (pediatric): Secondary | ICD-10-CM

## 2021-04-09 DIAGNOSIS — D801 Nonfamilial hypogammaglobulinemia: Secondary | ICD-10-CM | POA: Diagnosis present

## 2021-04-09 DIAGNOSIS — J45901 Unspecified asthma with (acute) exacerbation: Secondary | ICD-10-CM | POA: Diagnosis not present

## 2021-04-09 DIAGNOSIS — F4310 Post-traumatic stress disorder, unspecified: Secondary | ICD-10-CM | POA: Diagnosis present

## 2021-04-09 DIAGNOSIS — Z8349 Family history of other endocrine, nutritional and metabolic diseases: Secondary | ICD-10-CM

## 2021-04-09 DIAGNOSIS — J4551 Severe persistent asthma with (acute) exacerbation: Principal | ICD-10-CM | POA: Diagnosis present

## 2021-04-09 DIAGNOSIS — T380X5A Adverse effect of glucocorticoids and synthetic analogues, initial encounter: Secondary | ICD-10-CM | POA: Diagnosis not present

## 2021-04-09 DIAGNOSIS — Z888 Allergy status to other drugs, medicaments and biological substances status: Secondary | ICD-10-CM

## 2021-04-09 DIAGNOSIS — E1169 Type 2 diabetes mellitus with other specified complication: Secondary | ICD-10-CM | POA: Diagnosis present

## 2021-04-09 DIAGNOSIS — K219 Gastro-esophageal reflux disease without esophagitis: Secondary | ICD-10-CM | POA: Diagnosis present

## 2021-04-09 DIAGNOSIS — Z7951 Long term (current) use of inhaled steroids: Secondary | ICD-10-CM

## 2021-04-09 DIAGNOSIS — Z823 Family history of stroke: Secondary | ICD-10-CM

## 2021-04-09 DIAGNOSIS — I5032 Chronic diastolic (congestive) heart failure: Secondary | ICD-10-CM | POA: Diagnosis present

## 2021-04-09 DIAGNOSIS — I10 Essential (primary) hypertension: Secondary | ICD-10-CM | POA: Diagnosis present

## 2021-04-09 DIAGNOSIS — Z7901 Long term (current) use of anticoagulants: Secondary | ICD-10-CM

## 2021-04-09 DIAGNOSIS — E039 Hypothyroidism, unspecified: Secondary | ICD-10-CM | POA: Diagnosis present

## 2021-04-09 DIAGNOSIS — I213 ST elevation (STEMI) myocardial infarction of unspecified site: Secondary | ICD-10-CM | POA: Diagnosis not present

## 2021-04-09 DIAGNOSIS — Z8774 Personal history of (corrected) congenital malformations of heart and circulatory system: Secondary | ICD-10-CM

## 2021-04-09 DIAGNOSIS — M1990 Unspecified osteoarthritis, unspecified site: Secondary | ICD-10-CM | POA: Diagnosis present

## 2021-04-09 DIAGNOSIS — Z87891 Personal history of nicotine dependence: Secondary | ICD-10-CM

## 2021-04-09 DIAGNOSIS — Z8673 Personal history of transient ischemic attack (TIA), and cerebral infarction without residual deficits: Secondary | ICD-10-CM

## 2021-04-09 DIAGNOSIS — R1312 Dysphagia, oropharyngeal phase: Secondary | ICD-10-CM | POA: Diagnosis present

## 2021-04-09 DIAGNOSIS — Z8249 Family history of ischemic heart disease and other diseases of the circulatory system: Secondary | ICD-10-CM

## 2021-04-09 LAB — HEPATIC FUNCTION PANEL
ALT: 19 U/L (ref 0–44)
AST: 19 U/L (ref 15–41)
Albumin: 3.8 g/dL (ref 3.5–5.0)
Alkaline Phosphatase: 77 U/L (ref 38–126)
Bilirubin, Direct: 0.1 mg/dL (ref 0.0–0.2)
Indirect Bilirubin: 0.6 mg/dL (ref 0.3–0.9)
Total Bilirubin: 0.7 mg/dL (ref 0.3–1.2)
Total Protein: 6.7 g/dL (ref 6.5–8.1)

## 2021-04-09 LAB — TROPONIN I (HIGH SENSITIVITY): Troponin I (High Sensitivity): 19 ng/L — ABNORMAL HIGH (ref <18)

## 2021-04-09 LAB — CBC WITH DIFFERENTIAL/PLATELET
Abs Immature Granulocytes: 0.14 10*3/uL — ABNORMAL HIGH (ref 0.00–0.07)
Basophils Absolute: 0.1 10*3/uL (ref 0.0–0.1)
Basophils Relative: 1 %
Eosinophils Absolute: 0 10*3/uL (ref 0.0–0.5)
Eosinophils Relative: 0 %
HCT: 38.2 % (ref 36.0–46.0)
Hemoglobin: 13.5 g/dL (ref 12.0–15.0)
Immature Granulocytes: 2 %
Lymphocytes Relative: 19 %
Lymphs Abs: 1.6 10*3/uL (ref 0.7–4.0)
MCH: 32.8 pg (ref 26.0–34.0)
MCHC: 35.3 g/dL (ref 30.0–36.0)
MCV: 92.9 fL (ref 80.0–100.0)
Monocytes Absolute: 0.4 10*3/uL (ref 0.1–1.0)
Monocytes Relative: 5 %
Neutro Abs: 6.1 10*3/uL (ref 1.7–7.7)
Neutrophils Relative %: 73 %
Platelets: 233 10*3/uL (ref 150–400)
RBC: 4.11 MIL/uL (ref 3.87–5.11)
RDW: 13 % (ref 11.5–15.5)
WBC: 8.3 10*3/uL (ref 4.0–10.5)
nRBC: 0 % (ref 0.0–0.2)

## 2021-04-09 LAB — BASIC METABOLIC PANEL
Anion gap: 8 (ref 5–15)
BUN: 22 mg/dL — ABNORMAL HIGH (ref 6–20)
CO2: 25 mmol/L (ref 22–32)
Calcium: 8.9 mg/dL (ref 8.9–10.3)
Chloride: 103 mmol/L (ref 98–111)
Creatinine, Ser: 0.95 mg/dL (ref 0.44–1.00)
GFR, Estimated: >60 mL/min (ref >60)
Glucose, Bld: 370 mg/dL — ABNORMAL HIGH (ref 70–99)
Potassium: 3.9 mmol/L (ref 3.5–5.1)
Sodium: 136 mmol/L (ref 135–145)

## 2021-04-09 LAB — CBG MONITORING, ED: Glucose-Capillary: 351 mg/dL — ABNORMAL HIGH (ref 70–99)

## 2021-04-09 LAB — HEMOGLOBIN A1C
Hgb A1c MFr Bld: 6.8 % — ABNORMAL HIGH (ref 4.8–5.6)
Mean Plasma Glucose: 148 mg/dL

## 2021-04-09 IMAGING — DX DG CHEST 1V PORT
1 series · 1 of 1 positions shown · non-contrast
Comparison: [DATE]

CLINICAL DATA: Shortness of breath and chest pain

EXAM:
PORTABLE CHEST 1 VIEW

[chest ap]
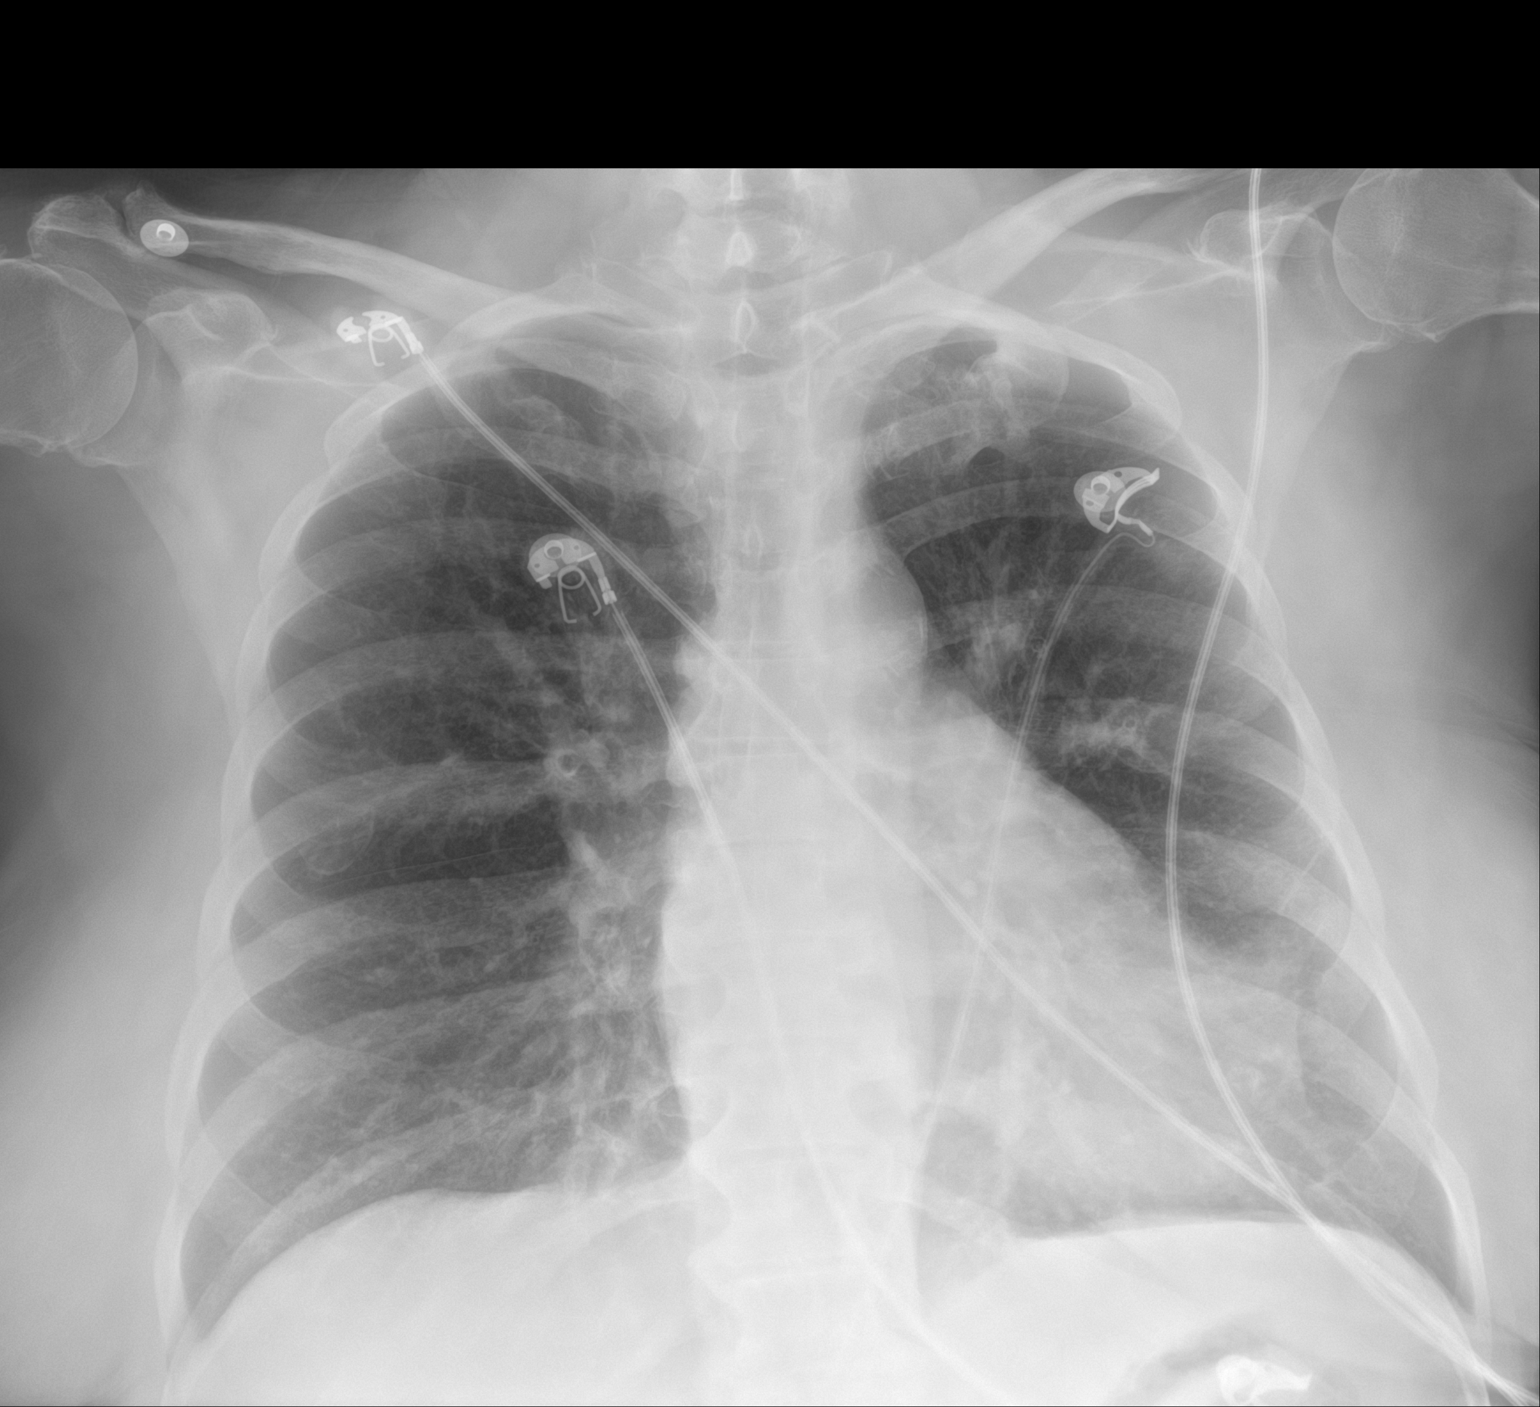

[1 of 1 positions shown; findings below may reference images not displayed]

FINDINGS: No focal opacity or pleural effusion. Stable cardiomediastinal
silhouette. Mitral annular calcification. No pneumothorax.
IMPRESSION: No active disease.

## 2021-04-09 MED ORDER — DILTIAZEM HCL-DEXTROSE 125-5 MG/125ML-% IV SOLN (PREMIX)
5.0000 mg/h | INTRAVENOUS | Status: DC
  Administered 2021-04-10: 5 mg/h via INTRAVENOUS
  Filled 2021-04-09: qty 125

## 2021-04-09 MED ORDER — IPRATROPIUM BROMIDE 0.02 % IN SOLN
0.5000 mg | Freq: Once | RESPIRATORY_TRACT | Status: AC
  Administered 2021-04-09: 0.5 mg via RESPIRATORY_TRACT
  Filled 2021-04-09: qty 2.5

## 2021-04-09 MED ORDER — ALBUTEROL SULFATE (2.5 MG/3ML) 0.083% IN NEBU
5.0000 mg | INHALATION_SOLUTION | Freq: Once | RESPIRATORY_TRACT | Status: AC
  Administered 2021-04-09: 5 mg via RESPIRATORY_TRACT
  Filled 2021-04-09: qty 6

## 2021-04-09 NOTE — ED Triage Notes (Signed)
From home, d/c from hospital yesterday. Hx DM, CHF. Endorses cough, SHOB 6/10 palpitation, pressure when she coughs, states "lungs hurt really bad," also endorses HA. CBG [id number]

## 2021-04-09 NOTE — ED Provider Notes (Signed)
[facility] EMERGENCY DEPARTMENT Provider Note   CSN: [id number] Arrival date & time: 04/09/21  2157     History Chief Complaint  Patient presents with   Chest Pain    Laura [name] is a 60 y.o. female.   Chest Pain Associated symptoms: cough and shortness of breath   Associated symptoms: no abdominal pain, no back pain and no weakness   Patient presents with shortness of breath chest pain.  History of asthma.  Discharged from [facility] yesterday after asthma exacerbation admission.  States that she was sent home too early.  Continues to cough.  Wheezing.  Also chest pain.  Has a history of atrial fibrillation and was in A. fib yesterday.  Now A. fib is going faster.  Has some dull chest pain on the left with coughing.  No relief with her nebulizers at home.    Past Medical History:  Diagnosis Date   Asthma    Atrial fibrillation (HCC)    Bipolar disorder (HCC)    Bronchiectasis (HCC)    Carpal tunnel syndrome of right wrist    CHF (congestive heart failure) (HCC)    Complication of anesthesia    "my Dr in [place] told me I had an allergic reaction to the combination of the pain meds and the anesthesia" she does not remember what happened but "I eneded up in the ICU for 5 days".   H/O congenital atrial septal defect (ASD) repair    Hypogammaglobulinemia (HCC)    Hypothyroid    IgE deficiency (HCC)    Neuropathy    Neuropathy    Osteoarthritis    Pinched nerve in neck    PTSD (post-traumatic stress disorder)    Recurrent sinusitis    Short-term memory loss    Sleep apnea    TIA (transient ischemic attack)    Tremors of nervous system    seeing PCP for this.   Type 2 diabetes mellitus (HCC)    Urticaria    Wound infection     Patient Active Problem List   Diagnosis Date Noted   Asthma exacerbation 04/07/2021   Adnexal cyst 01/19/2021   Chronic RLQ pain 01/19/2021   Mixed stress and urge urinary incontinence 01/19/2021   Other fatigue  12/10/2020   Abdominal pain, chronic, right lower quadrant 12/08/2020   Abnormal weight loss 12/08/2020   Poor appetite 12/08/2020   Abnormal EKG    Chronic diastolic (congestive) heart failure (HCC) 08/29/2019   Shortness of breath 08/29/2019   Bipolar disorder (HCC) 08/10/2018   Headache 08/10/2018   H/O atrial flutter 08/10/2018   OSA on CPAP 08/10/2018   Anxiety 08/10/2018   Uncontrolled type 2 diabetes mellitus with hyperglycemia (HCC) 03/13/2018   Hypothyroidism 03/13/2018   Essential hypertension, benign 03/13/2018   Mixed hyperlipidemia 03/13/2018   Hypogammaglobulinemia (HCC) 02/06/2018   Non-allergic rhinitis 02/06/2018   Severe persistent asthma, uncomplicated 02/06/2018   Pulmonary nodules 02/06/2018   Bronchiectasis without complication (HCC) 02/06/2018   DM type 2 causing vascular disease (HCC) 02/06/2018    Past Surgical History:  Procedure Laterality Date   ASD REPAIR  1968   BIOPSY  01/26/2021   Procedure: BIOPSY;  Surgeon: Lanelle Bal, DO;  [facility]: [facility];  Service: Endoscopy;;  gastric   CARDIAC SURGERY     CARDIOVERSION     CARDIOVERSION N/A 08/29/2018   Procedure: CARDIOVERSION;  Surgeon: Antoine Poche, MD;  Location: [facility];  Service: Endoscopy;  Laterality: N/A;  [place]   COLONOSCOPY WITH PROPOFOL N/A 01/26/2021   Procedure: COLONOSCOPY WITH PROPOFOL;  Surgeon: [name], DO;  Location: [facility];  Service: Endoscopy;  Laterality: N/A;  am appt, diabetic   ESOPHAGOGASTRODUODENOSCOPY (EGD) WITH PROPOFOL N/A 01/26/2021   Procedure: ESOPHAGOGASTRODUODENOSCOPY (EGD) WITH PROPOFOL;  Surgeon: [name], DO;  Location: [facility];  Service: Endoscopy;  Laterality: N/A;   LEFT HEART CATH AND CORONARY ANGIOGRAPHY N/A 08/30/2019   Procedure: LEFT HEART CATH AND CORONARY ANGIOGRAPHY;  Surgeon: [name], MD;  Location: [place] [facility];  Service: Cardiovascular;  Laterality:  N/A;   NASAL SINUS SURGERY  2017   POLYPECTOMY  01/26/2021   Procedure: POLYPECTOMY;  Surgeon: [name], DO;  Location: [facility];  Service: Endoscopy;;  colon     OB History     Gravida  6   Para  3   Term      Preterm  3   AB  3   Living  3      SAB  3   IAB      Ectopic      Multiple      Live Births              Family History  Problem Relation Age of Onset   Hypertension Mother    Stroke Mother    Kidney disease Mother    Heart attack Mother    Alcohol abuse Mother    Other Father        ?colon cancer   Kidney disease Sister    Heart disease Brother    Stroke Maternal Aunt    Heart disease Maternal Aunt    Hypertension Sister    Bipolar disorder Sister    Thyroid disease Sister    Prostate cancer Brother    Thyroid disease Daughter    Hashimoto's thyroiditis Daughter    Celiac disease Daughter    Allergic rhinitis Daughter    Asthma Neg Hx     Social History   Tobacco Use   Smoking status: Former    Packs/day: 1.00    Years: 28.00    Pack years: 28.00    Types: Cigarettes    Start date: 38    Quit date: 2008    Years since quitting: 14.5   Smokeless tobacco: Never  Vaping Use   Vaping Use: Never used  Substance Use Topics   Alcohol use: Not Currently   Drug use: Not Currently    Home Medications Prior to Admission medications   Medication Sig Start Date End Date Taking? Authorizing Provider  acetaminophen (TYLENOL) 325 MG tablet Take 2 tablets (650 mg total) by mouth every 6 (six) hours as needed for mild pain, fever or headache. 08/11/18   Roxan Hockey, MD  albuterol (PROVENTIL) (2.5 MG/3ML) 0.083% nebulizer solution Take 3 mLs (2.5 mg total) by nebulization every 4 (four) hours as needed for wheezing or shortness of breath. 04/07/21   Althea Charon, FNP  albuterol (VENTOLIN HFA) 108 (90 Base) MCG/ACT inhaler INHALE 2 PUFFS INTO THE LUNGS EVERY 6 HOURS AS NEEDED FOR WHEEZING OR SHORTNESS OF BREATH 04/07/21    Althea Charon, FNP  apixaban (ELIQUIS) 5 MG TABS tablet Take 5 mg by mouth 2 (two) times daily.    [provider]  atorvastatin (LIPITOR) 10 MG tablet Take 10 mg by mouth every evening.     [provider]  BD PEN NEEDLE  NANO 2ND GEN 32G X 4 MM MISC SMARTSIG:Injection 4 Times Daily 05/20/20   [provider]  benzonatate (TESSALON) 200 MG capsule Take 1 capsule (200 mg total) by mouth 3 (three) times daily as needed for cough. 04/08/21   Roxan Hockey, MD  blood glucose meter kit and supplies 1 each by Other route 4 (four) times daily. Dispense based on patient and insurance preference. Use up to four times daily as directed. (FOR ICD-10 E10.9, E11.9). 01/27/21   Cassandria Anger, MD  Cholecalciferol (VITAMIN D3) 125 MCG (5000 UT) CAPS Take 1 capsule (5,000 Units total) by mouth daily. 11/25/20   Cassandria Anger, MD  Continuous Blood Gluc Receiver (FREESTYLE LIBRE 2 READER) DEVI As directed 12/10/20   Cassandria Anger, MD  Continuous Blood Gluc Sensor (FREESTYLE LIBRE 2 SENSOR) MISC 1 Piece by Does not apply route every 14 (fourteen) days. 12/10/20   Cassandria Anger, MD  diltiazem (CARDIZEM CD) 360 MG 24 hr capsule TAKE 1 CAPSULE(360 MG) BY MOUTH DAILY Patient taking differently: Take 360 mg by mouth daily. 03/01/21   Arnoldo Lenis, MD  divalproex (DEPAKOTE ER) 500 MG 24 hr tablet 500 mg in AM, 1000 mg at night 02/04/21   Hisada, Elie Goody, MD  ELDERBERRY PO Take 4 g by mouth daily. Gummies 2 g each Patient not taking: Reported on 04/07/2021    [provider]  EPINEPHrine 0.3 mg/0.3 mL IJ SOAJ injection Inject 0.3 mg into the muscle as needed for anaphylaxis. 01/17/20   [provider]  FASENRA PEN 30 MG/ML SOAJ INJECT $RemoveBef'30MG'[id number]$  SUBCUTANEOUSLY  EVERY 8 WEEKS (GIVEN AT MD  OFFICE) Patient taking differently: Inject 30 mg into the skin See admin instructions. Every eight weeks 12/21/20   Valentina Shaggy, MD  ferrous sulfate 324 MG TBEC Take  324 mg by mouth daily with breakfast.    [provider]  fluticasone (FLOVENT HFA) 220 MCG/ACT inhaler 2 puffs twice daily with spacer2 puffs twice daily with spacer 04/08/21   Althea Charon, FNP  gabapentin (NEURONTIN) 300 MG capsule Take 300 mg by mouth 3 (three) times daily.     [provider]  hydrALAZINE (APRESOLINE) 25 MG tablet Take 25 mg by mouth 3 (three) times daily.    [provider]  hydrOXYzine (ATARAX/VISTARIL) 25 MG tablet Take 1 tablet (25 mg total) by mouth every 6 (six) hours as needed for anxiety or nausea. 08/11/18   Roxan Hockey, MD  Immune Globulin, Human, 4 GM/20ML SOLN Inject 100 mLs into the skin every 14 (fourteen) days.    [provider]  LANTUS SOLOSTAR 100 UNIT/ML Solostar Pen Inject 40 Units into the skin at bedtime. 08/15/19   [provider]  levothyroxine (SYNTHROID, LEVOTHROID) 112 MCG tablet Take 112 mcg by mouth daily before breakfast.    [provider]  Magnesium Oxide 400 MG CAPS Take 1 capsule (400 mg total) by mouth daily. 11/04/19   Arnoldo Lenis, MD  Melatonin 3 MG TABS Take 3 mg by mouth at bedtime.    [provider]  metoCLOPramide (REGLAN) 5 MG tablet Take 5 mg by mouth 3 (three) times daily. 04/05/21   [provider]  montelukast (SINGULAIR) 10 MG tablet Take 1 tablet by mouth daily. 11/23/20   [provider]  Multiple Vitamins-Minerals (MULTIVITAMIN WITH MINERALS) tablet Take 1 tablet by mouth daily. Woman    [provider]  Multiple Vitamins-Minerals (MULTIVITAMIN WITH MINERALS) tablet Take 1 tablet by mouth daily. Vita  Fusion sugar free 4 gram fiber Patient not taking: Reported on 04/07/2021    [provider]  Multiple Vitamins-Minerals (ZINC PO) Take by mouth. Patient not taking: No sig reported    [provider]  Nebulizer MISC Nebulizer tubing kit 10/21/20   Valentina Shaggy, MD  omeprazole (PRILOSEC) 20 MG capsule Take 1  capsule (20 mg total) by mouth 2 (two) times daily before a meal. 01/26/21 07/25/21  Carver, Elon Alas, DO  ondansetron (ZOFRAN) 4 MG tablet Take 1 tablet (4 mg total) by mouth every 8 (eight) hours as needed for nausea or vomiting. 07/13/19   Hassell Done Mary-Margaret, FNP  ONETOUCH ULTRA test strip USE TO CHECK BLOOD SUGAR FOUR TIMES DAILY 10/17/19   [provider]  Potassium Chloride ER 20 MEQ TBCR Take 1 tablet by mouth daily. 01/04/21   [provider]  predniSONE (DELTASONE) 20 MG tablet Take 2 tablets (40 mg total) by mouth daily with breakfast for 5 days. 04/08/21 04/13/21  Roxan Hockey, MD  Tiotropium Bromide Monohydrate (SPIRIVA RESPIMAT) 1.25 MCG/ACT AERS Inhale 2 puffs into the lungs daily. 04/08/21   Althea Charon, FNP  topiramate (TOPAMAX) 100 MG tablet Take 1 tablet (100 mg total) by mouth 2 (two) times daily. 12/04/20   Tat, Eustace Quail, DO  torsemide (DEMADEX) 20 MG tablet Take 2 tablets (40 mg total) by mouth daily. Patient taking differently: Take 20 mg by mouth daily. 11/03/20 04/07/21  Strader, Fransisco Hertz, PA-C  triamcinolone cream (KENALOG) 0.1 % Apply 1 application topically 2 (two) times daily. 03/15/21   [provider]  TRULICITY 7.61 YW/7.3XT SOPN ADMINISTER 0.75 MG UNDER THE SKIN 1 TIME A WEEK Patient taking differently: Inject 0.75 mg into the vein every 7 (seven) days. 02/12/21   Cassandria Anger, MD  vitamin B-12 (CYANOCOBALAMIN) 1000 MCG tablet Take 1,000 mcg by mouth daily.    [provider]  vitamin C (ASCORBIC ACID) 500 MG tablet Take 500 mg by mouth daily. Power C immune support    [provider]    Allergies    Ativan [lorazepam] and Phenergan [promethazine hcl]  Review of Systems   Review of Systems  Constitutional:  Negative for appetite change.  HENT:  Negative for congestion.   Respiratory:  Positive for cough, shortness of breath and wheezing.   Cardiovascular:  Positive for chest pain.  Gastrointestinal:   Negative for abdominal pain.  Genitourinary:  Negative for dysuria.  Musculoskeletal:  Negative for back pain.  Skin:  Negative for rash.  Neurological:  Negative for weakness.  Psychiatric/Behavioral:  Negative for confusion.    Physical Exam Updated Vital Signs BP 129/89 (BP Location: Right Arm)   Pulse (!) 127   Resp (!) 22   SpO2 96%   Physical Exam Vitals and nursing note reviewed.  HENT:     Head: Atraumatic.  Cardiovascular:     Rate and Rhythm: Tachycardia present. Rhythm irregular.  Pulmonary:     Effort: Tachypnea present.     Breath sounds: Wheezing present.     Comments: Wheezes and prolonged expirations. Chest:     Chest wall: Tenderness present.     Comments: Mild tenderness left anterior lower chest wall. Abdominal:     Tenderness: There is no abdominal tenderness.  Musculoskeletal:     Cervical back: Normal range of motion.     Right lower leg: No edema.     Left lower leg: No edema.  Skin:    General: Skin is warm.  Capillary Refill: Capillary refill takes less than 2 seconds.  Neurological:     Mental Status: She is alert.  Psychiatric:        Behavior: Behavior normal.    ED Results / Procedures / Treatments   Labs (all labs ordered are listed, but only abnormal results are displayed) Labs Reviewed  CBC WITH DIFFERENTIAL/PLATELET - Abnormal; Notable for the following components:      Result Value   Abs Immature Granulocytes 0.14 (*)    All other components within normal limits  BASIC METABOLIC PANEL - Abnormal; Notable for the following components:   Glucose, Bld 370 (*)    BUN 22 (*)    All other components within normal limits  CBG MONITORING, ED - Abnormal; Notable for the following components:   Glucose-Capillary 351 (*)    All other components within normal limits  TROPONIN I (HIGH SENSITIVITY) - Abnormal; Notable for the following components:   Troponin I (High Sensitivity) 19 (*)    All other components within normal limits   HEPATIC FUNCTION PANEL  AMMONIA  VALPROIC ACID LEVEL  TROPONIN I (HIGH SENSITIVITY)    EKG None  Radiology DG Chest Portable 1 View  Result Date: 04/09/2021 CLINICAL DATA:  Shortness of breath and chest pain EXAM: PORTABLE CHEST 1 VIEW COMPARISON:  04/07/2021 FINDINGS: No focal opacity or pleural effusion. Stable cardiomediastinal silhouette. Mitral annular calcification. No pneumothorax. IMPRESSION: No active disease. Electronically Signed   By: Donavan Foil M.D.   On: 04/09/2021 22:51    Procedures Procedures   Medications Ordered in ED Medications  albuterol (PROVENTIL) (2.5 MG/3ML) 0.083% nebulizer solution 5 mg (5 mg Nebulization Given 04/09/21 2216)  ipratropium (ATROVENT) nebulizer solution 0.5 mg (0.5 mg Nebulization Given 04/09/21 2216)    ED Course  I have reviewed the triage vital signs and the nursing notes.  Pertinent labs & imaging results that were available during my care of the patient were reviewed by me and considered in my medical decision making (see chart for details).    MDM Rules/Calculators/A&P                          Patient presents with shortness of breath and cough.  Discharged from hospital yesterday for same.  Patient states she was sent home too early.  Wheezing upon arrival.  Had had breathing treatments at home.  Still dyspneic and tight.  Improved somewhat with breathing treatment here.  Does have a elevated glucose.  Is on p.o. prednisone. Also found to be in atrial fib with RVR.  History of same.  Could be secondary to breathing status.  We will start rate control medicines.  Admit to internal medicine.  Doubt pulmonary embolism.  Recently evaluated for same.  CRITICAL CARE Performed by: Davonna Belling Total critical care time: 30 minutes Critical care time was exclusive of separately billable procedures and treating other patients. Critical care was necessary to treat or prevent imminent or life-threatening deterioration. Critical care  was time spent personally by me on the following activities: development of treatment plan with patient and/or surrogate as well as nursing, discussions with consultants, evaluation of patient's response to treatment, examination of patient, obtaining history from patient or surrogate, ordering and performing treatments and interventions, ordering and review of laboratory studies, ordering and review of radiographic studies, pulse oximetry and re-evaluation of patient's condition.  Chadsvasc 4 already on anticoagulation.  Final Clinical Impression(s) / ED Diagnoses Final diagnoses:  Exacerbation of  asthma, unspecified asthma severity, unspecified whether persistent  Atrial fibrillation with rapid ventricular response ([place])  Hyperglycemia    Rx / DC Orders ED Discharge Orders     None        Davonna Belling, MD 04/09/21 2357

## 2021-04-10 ENCOUNTER — Encounter (HOSPITAL_COMMUNITY): Payer: Self-pay | Admitting: Internal Medicine

## 2021-04-10 ENCOUNTER — Other Ambulatory Visit: Payer: Self-pay

## 2021-04-10 DIAGNOSIS — J4551 Severe persistent asthma with (acute) exacerbation: Principal | ICD-10-CM

## 2021-04-10 DIAGNOSIS — I5032 Chronic diastolic (congestive) heart failure: Secondary | ICD-10-CM | POA: Diagnosis present

## 2021-04-10 DIAGNOSIS — Z79899 Other long term (current) drug therapy: Secondary | ICD-10-CM | POA: Diagnosis not present

## 2021-04-10 DIAGNOSIS — E039 Hypothyroidism, unspecified: Secondary | ICD-10-CM | POA: Diagnosis not present

## 2021-04-10 DIAGNOSIS — K5900 Constipation, unspecified: Secondary | ICD-10-CM | POA: Diagnosis present

## 2021-04-10 DIAGNOSIS — E782 Mixed hyperlipidemia: Secondary | ICD-10-CM | POA: Diagnosis not present

## 2021-04-10 DIAGNOSIS — Z9989 Dependence on other enabling machines and devices: Secondary | ICD-10-CM | POA: Diagnosis not present

## 2021-04-10 DIAGNOSIS — I248 Other forms of acute ischemic heart disease: Secondary | ICD-10-CM | POA: Diagnosis present

## 2021-04-10 DIAGNOSIS — K219 Gastro-esophageal reflux disease without esophagitis: Secondary | ICD-10-CM | POA: Diagnosis present

## 2021-04-10 DIAGNOSIS — I480 Paroxysmal atrial fibrillation: Secondary | ICD-10-CM | POA: Diagnosis not present

## 2021-04-10 DIAGNOSIS — I4891 Unspecified atrial fibrillation: Secondary | ICD-10-CM | POA: Diagnosis not present

## 2021-04-10 DIAGNOSIS — E1165 Type 2 diabetes mellitus with hyperglycemia: Secondary | ICD-10-CM | POA: Diagnosis not present

## 2021-04-10 DIAGNOSIS — T380X5A Adverse effect of glucocorticoids and synthetic analogues, initial encounter: Secondary | ICD-10-CM | POA: Diagnosis not present

## 2021-04-10 DIAGNOSIS — G4733 Obstructive sleep apnea (adult) (pediatric): Secondary | ICD-10-CM | POA: Diagnosis not present

## 2021-04-10 DIAGNOSIS — D801 Nonfamilial hypogammaglobulinemia: Secondary | ICD-10-CM | POA: Diagnosis not present

## 2021-04-10 DIAGNOSIS — E1169 Type 2 diabetes mellitus with other specified complication: Secondary | ICD-10-CM | POA: Diagnosis not present

## 2021-04-10 DIAGNOSIS — I10 Essential (primary) hypertension: Secondary | ICD-10-CM

## 2021-04-10 DIAGNOSIS — I110 Hypertensive heart disease with heart failure: Secondary | ICD-10-CM | POA: Diagnosis present

## 2021-04-10 DIAGNOSIS — F319 Bipolar disorder, unspecified: Secondary | ICD-10-CM | POA: Diagnosis present

## 2021-04-10 DIAGNOSIS — Z79890 Hormone replacement therapy (postmenopausal): Secondary | ICD-10-CM | POA: Diagnosis not present

## 2021-04-10 DIAGNOSIS — Z20822 Contact with and (suspected) exposure to covid-19: Secondary | ICD-10-CM | POA: Diagnosis present

## 2021-04-10 DIAGNOSIS — Z794 Long term (current) use of insulin: Secondary | ICD-10-CM

## 2021-04-10 DIAGNOSIS — R739 Hyperglycemia, unspecified: Secondary | ICD-10-CM | POA: Diagnosis not present

## 2021-04-10 DIAGNOSIS — R0902 Hypoxemia: Secondary | ICD-10-CM | POA: Diagnosis present

## 2021-04-10 DIAGNOSIS — J45901 Unspecified asthma with (acute) exacerbation: Secondary | ICD-10-CM | POA: Diagnosis present

## 2021-04-10 DIAGNOSIS — Z7901 Long term (current) use of anticoagulants: Secondary | ICD-10-CM | POA: Diagnosis not present

## 2021-04-10 DIAGNOSIS — R0789 Other chest pain: Secondary | ICD-10-CM | POA: Diagnosis not present

## 2021-04-10 DIAGNOSIS — F4310 Post-traumatic stress disorder, unspecified: Secondary | ICD-10-CM | POA: Diagnosis present

## 2021-04-10 DIAGNOSIS — R1312 Dysphagia, oropharyngeal phase: Secondary | ICD-10-CM | POA: Diagnosis present

## 2021-04-10 DIAGNOSIS — M1990 Unspecified osteoarthritis, unspecified site: Secondary | ICD-10-CM | POA: Diagnosis present

## 2021-04-10 LAB — VALPROIC ACID LEVEL: Valproic Acid Lvl: 27 ug/mL — ABNORMAL LOW (ref 50.0–100.0)

## 2021-04-10 LAB — CBC WITH DIFFERENTIAL/PLATELET
Abs Immature Granulocytes: 0.11 10*3/uL — ABNORMAL HIGH (ref 0.00–0.07)
Basophils Absolute: 0.1 10*3/uL (ref 0.0–0.1)
Basophils Relative: 1 %
Eosinophils Absolute: 0 10*3/uL (ref 0.0–0.5)
Eosinophils Relative: 0 %
HCT: 36.1 % (ref 36.0–46.0)
Hemoglobin: 12.9 g/dL (ref 12.0–15.0)
Immature Granulocytes: 1 %
Lymphocytes Relative: 36 %
Lymphs Abs: 2.8 10*3/uL (ref 0.7–4.0)
MCH: 33 pg (ref 26.0–34.0)
MCHC: 35.7 g/dL (ref 30.0–36.0)
MCV: 92.3 fL (ref 80.0–100.0)
Monocytes Absolute: 0.5 10*3/uL (ref 0.1–1.0)
Monocytes Relative: 6 %
Neutro Abs: 4.4 10*3/uL (ref 1.7–7.7)
Neutrophils Relative %: 56 %
Platelets: 200 10*3/uL (ref 150–400)
RBC: 3.91 MIL/uL (ref 3.87–5.11)
RDW: 13.1 % (ref 11.5–15.5)
WBC: 7.8 10*3/uL (ref 4.0–10.5)
nRBC: 0 % (ref 0.0–0.2)

## 2021-04-10 LAB — COMPREHENSIVE METABOLIC PANEL
ALT: 15 U/L (ref 0–44)
AST: 17 U/L (ref 15–41)
Albumin: 3.6 g/dL (ref 3.5–5.0)
Alkaline Phosphatase: 64 U/L (ref 38–126)
Anion gap: 10 (ref 5–15)
BUN: 20 mg/dL (ref 6–20)
CO2: 24 mmol/L (ref 22–32)
Calcium: 9.1 mg/dL (ref 8.9–10.3)
Chloride: 105 mmol/L (ref 98–111)
Creatinine, Ser: 0.87 mg/dL (ref 0.44–1.00)
GFR, Estimated: >60 mL/min (ref >60)
Glucose, Bld: 264 mg/dL — ABNORMAL HIGH (ref 70–99)
Potassium: 3.6 mmol/L (ref 3.5–5.1)
Sodium: 139 mmol/L (ref 135–145)
Total Bilirubin: 0.5 mg/dL (ref 0.3–1.2)
Total Protein: 6.3 g/dL — ABNORMAL LOW (ref 6.5–8.1)

## 2021-04-10 LAB — TSH: TSH: 2.048 u[IU]/mL (ref 0.350–4.500)

## 2021-04-10 LAB — GLUCOSE, CAPILLARY
Glucose-Capillary: 243 mg/dL — ABNORMAL HIGH (ref 70–99)
Glucose-Capillary: 316 mg/dL — ABNORMAL HIGH (ref 70–99)
Glucose-Capillary: 324 mg/dL — ABNORMAL HIGH (ref 70–99)
Glucose-Capillary: 263 mg/dL — ABNORMAL HIGH (ref 70–99)
Glucose-Capillary: 89 mg/dL (ref 70–99)

## 2021-04-10 LAB — TROPONIN I (HIGH SENSITIVITY): Troponin I (High Sensitivity): 20 ng/L — ABNORMAL HIGH (ref <18)

## 2021-04-10 LAB — MAGNESIUM
Magnesium: 1.9 mg/dL (ref 1.7–2.4)
Magnesium: 1.8 mg/dL (ref 1.7–2.4)

## 2021-04-10 LAB — AMMONIA: Ammonia: 37 umol/L — ABNORMAL HIGH (ref 9–35)

## 2021-04-10 LAB — SARS CORONAVIRUS 2 (TAT 6-24 HRS): SARS Coronavirus 2: NEGATIVE

## 2021-04-10 MED ORDER — BENZONATATE 100 MG PO CAPS
100.0000 mg | ORAL_CAPSULE | Freq: Three times a day (TID) | ORAL | Status: DC | PRN
  Administered 2021-04-10 – 2021-04-16 (×9): 100 mg via ORAL
  Filled 2021-04-10 (×9): qty 1

## 2021-04-10 MED ORDER — UMECLIDINIUM BROMIDE 62.5 MCG/INH IN AEPB
1.0000 | INHALATION_SPRAY | Freq: Every day | RESPIRATORY_TRACT | Status: DC
  Administered 2021-04-10 – 2021-04-16 (×7): 1 via RESPIRATORY_TRACT
  Filled 2021-04-10: qty 7

## 2021-04-10 MED ORDER — DIVALPROEX SODIUM ER 500 MG PO TB24
500.0000 mg | ORAL_TABLET | Freq: Every day | ORAL | Status: DC
  Administered 2021-04-10 – 2021-04-17 (×8): 500 mg via ORAL
  Filled 2021-04-10 (×8): qty 1

## 2021-04-10 MED ORDER — INSULIN GLARGINE 100 UNIT/ML ~~LOC~~ SOLN
50.0000 [IU] | Freq: Every day | SUBCUTANEOUS | Status: DC
  Filled 2021-04-10: qty 0.5

## 2021-04-10 MED ORDER — INSULIN ASPART 100 UNIT/ML IJ SOLN: 8.0000 [IU] | Freq: Three times a day (TID) | INTRAMUSCULAR | Status: DC

## 2021-04-10 MED ORDER — DILTIAZEM HCL 60 MG PO TABS
60.0000 mg | ORAL_TABLET | Freq: Three times a day (TID) | ORAL | Status: AC
  Administered 2021-04-10 – 2021-04-15 (×15): 60 mg via ORAL
  Filled 2021-04-10 (×16): qty 1

## 2021-04-10 MED ORDER — MELATONIN 5 MG PO TABS
10.0000 mg | ORAL_TABLET | Freq: Every evening | ORAL | Status: DC | PRN
  Administered 2021-04-10 – 2021-04-17 (×7): 10 mg via ORAL
  Filled 2021-04-10 (×8): qty 2

## 2021-04-10 MED ORDER — INSULIN ASPART 100 UNIT/ML IJ SOLN
0.0000 [IU] | Freq: Three times a day (TID) | INTRAMUSCULAR | Status: DC
Start: 2021-04-10 — End: 2021-04-17
  Administered 2021-04-10: 20 [IU] via SUBCUTANEOUS
  Administered 2021-04-10: 11 [IU] via SUBCUTANEOUS
  Administered 2021-04-10: 15 [IU] via SUBCUTANEOUS
  Administered 2021-04-11 (×2): 20 [IU] via SUBCUTANEOUS
  Administered 2021-04-11: 4 [IU] via SUBCUTANEOUS
  Administered 2021-04-12: 11 [IU] via SUBCUTANEOUS
  Administered 2021-04-12: 7 [IU] via SUBCUTANEOUS
  Administered 2021-04-12: 15 [IU] via SUBCUTANEOUS
  Administered 2021-04-13: 4 [IU] via SUBCUTANEOUS
  Administered 2021-04-13: 11 [IU] via SUBCUTANEOUS
  Administered 2021-04-13 – 2021-04-14 (×2): 20 [IU] via SUBCUTANEOUS
  Administered 2021-04-14: 11 [IU] via SUBCUTANEOUS
  Administered 2021-04-14 – 2021-04-15 (×2): 7 [IU] via SUBCUTANEOUS
  Administered 2021-04-15: 20 [IU] via SUBCUTANEOUS
  Administered 2021-04-15 – 2021-04-16 (×2): 7 [IU] via SUBCUTANEOUS
  Administered 2021-04-17: 4 [IU] via SUBCUTANEOUS

## 2021-04-10 MED ORDER — DIVALPROEX SODIUM ER 500 MG PO TB24
1000.0000 mg | ORAL_TABLET | Freq: Every day | ORAL | Status: DC
  Administered 2021-04-10 – 2021-04-16 (×7): 1000 mg via ORAL
  Filled 2021-04-10 (×8): qty 2

## 2021-04-10 MED ORDER — GABAPENTIN 300 MG PO CAPS
300.0000 mg | ORAL_CAPSULE | Freq: Three times a day (TID) | ORAL | Status: DC
  Administered 2021-04-10 – 2021-04-17 (×22): 300 mg via ORAL
  Filled 2021-04-10 (×22): qty 1

## 2021-04-10 MED ORDER — INSULIN ASPART 100 UNIT/ML IJ SOLN
0.0000 [IU] | Freq: Every day | INTRAMUSCULAR | Status: DC
  Administered 2021-04-11: 2 [IU] via SUBCUTANEOUS
  Administered 2021-04-12 – 2021-04-13 (×2): 3 [IU] via SUBCUTANEOUS
  Administered 2021-04-15: 2 [IU] via SUBCUTANEOUS
  Administered 2021-04-16: 5 [IU] via SUBCUTANEOUS

## 2021-04-10 MED ORDER — APIXABAN 5 MG PO TABS
5.0000 mg | ORAL_TABLET | Freq: Two times a day (BID) | ORAL | Status: DC
  Administered 2021-04-10 – 2021-04-17 (×15): 5 mg via ORAL
  Filled 2021-04-10 (×15): qty 1

## 2021-04-10 MED ORDER — ONDANSETRON HCL 4 MG PO TABS: 4.0000 mg | ORAL_TABLET | Freq: Four times a day (QID) | ORAL | Status: DC | PRN

## 2021-04-10 MED ORDER — INSULIN ASPART 100 UNIT/ML IJ SOLN
0.0000 [IU] | Freq: Three times a day (TID) | INTRAMUSCULAR | Status: DC
Start: 2021-04-10 — End: 2021-04-10

## 2021-04-10 MED ORDER — HYDROXYZINE HCL 25 MG PO TABS
25.0000 mg | ORAL_TABLET | Freq: Four times a day (QID) | ORAL | Status: DC | PRN
  Administered 2021-04-10 – 2021-04-17 (×14): 25 mg via ORAL
  Filled 2021-04-10 (×15): qty 1

## 2021-04-10 MED ORDER — INSULIN GLARGINE 100 UNIT/ML ~~LOC~~ SOLN
40.0000 [IU] | Freq: Every day | SUBCUTANEOUS | Status: DC
  Filled 2021-04-10: qty 0.4

## 2021-04-10 MED ORDER — BUDESONIDE 0.5 MG/2ML IN SUSP
1.0000 mg | Freq: Two times a day (BID) | RESPIRATORY_TRACT | Status: DC
  Administered 2021-04-10 – 2021-04-17 (×14): 1 mg via RESPIRATORY_TRACT
  Filled 2021-04-10 (×15): qty 4

## 2021-04-10 MED ORDER — ACETAMINOPHEN 650 MG RE SUPP: 650.0000 mg | Freq: Four times a day (QID) | RECTAL | Status: DC | PRN

## 2021-04-10 MED ORDER — METOCLOPRAMIDE HCL 5 MG PO TABS
5.0000 mg | ORAL_TABLET | Freq: Three times a day (TID) | ORAL | Status: DC
  Administered 2021-04-10 – 2021-04-17 (×23): 5 mg via ORAL
  Filled 2021-04-10 (×23): qty 1

## 2021-04-10 MED ORDER — ONDANSETRON HCL 4 MG/2ML IJ SOLN
4.0000 mg | Freq: Four times a day (QID) | INTRAMUSCULAR | Status: DC | PRN
  Administered 2021-04-15: 4 mg via INTRAVENOUS
  Filled 2021-04-10: qty 2

## 2021-04-10 MED ORDER — MAGNESIUM SULFATE IN D5W 1-5 GM/100ML-% IV SOLN
1.0000 g | Freq: Once | INTRAVENOUS | Status: AC
  Administered 2021-04-10: 1 g via INTRAVENOUS
  Filled 2021-04-10: qty 100

## 2021-04-10 MED ORDER — PANTOPRAZOLE SODIUM 40 MG PO TBEC
40.0000 mg | DELAYED_RELEASE_TABLET | Freq: Two times a day (BID) | ORAL | Status: DC
  Administered 2021-04-10 – 2021-04-17 (×15): 40 mg via ORAL
  Filled 2021-04-10 (×15): qty 1

## 2021-04-10 MED ORDER — INSULIN GLARGINE 100 UNIT/ML ~~LOC~~ SOLN
35.0000 [IU] | Freq: Once | SUBCUTANEOUS | Status: AC
  Administered 2021-04-10: 35 [IU] via SUBCUTANEOUS
  Filled 2021-04-10: qty 0.35

## 2021-04-10 MED ORDER — TOPIRAMATE 100 MG PO TABS
100.0000 mg | ORAL_TABLET | Freq: Two times a day (BID) | ORAL | Status: DC
  Administered 2021-04-10 – 2021-04-16 (×14): 100 mg via ORAL
  Filled 2021-04-10 (×16): qty 1

## 2021-04-10 MED ORDER — GUAIFENESIN-CODEINE 100-10 MG/5ML PO SOLN
10.0000 mL | Freq: Four times a day (QID) | ORAL | Status: DC | PRN
  Administered 2021-04-10 – 2021-04-17 (×22): 10 mL via ORAL
  Filled 2021-04-10 (×22): qty 10

## 2021-04-10 MED ORDER — IPRATROPIUM-ALBUTEROL 0.5-2.5 (3) MG/3ML IN SOLN
3.0000 mL | Freq: Four times a day (QID) | RESPIRATORY_TRACT | Status: DC
  Administered 2021-04-10 – 2021-04-11 (×4): 3 mL via RESPIRATORY_TRACT
  Filled 2021-04-10 (×7): qty 3

## 2021-04-10 MED ORDER — INSULIN ASPART 100 UNIT/ML IJ SOLN
8.0000 [IU] | Freq: Three times a day (TID) | INTRAMUSCULAR | Status: DC
  Administered 2021-04-10 – 2021-04-11 (×5): 8 [IU] via SUBCUTANEOUS

## 2021-04-10 MED ORDER — TORSEMIDE 20 MG PO TABS
20.0000 mg | ORAL_TABLET | Freq: Every day | ORAL | Status: DC
  Administered 2021-04-10 – 2021-04-17 (×8): 20 mg via ORAL
  Filled 2021-04-10 (×8): qty 1

## 2021-04-10 MED ORDER — MONTELUKAST SODIUM 10 MG PO TABS
10.0000 mg | ORAL_TABLET | Freq: Every day | ORAL | Status: DC
  Administered 2021-04-10 – 2021-04-17 (×8): 10 mg via ORAL
  Filled 2021-04-10 (×8): qty 1

## 2021-04-10 MED ORDER — INSULIN GLARGINE 100 UNIT/ML ~~LOC~~ SOLN
20.0000 [IU] | Freq: Every day | SUBCUTANEOUS | Status: DC
  Administered 2021-04-10: 20 [IU] via SUBCUTANEOUS
  Filled 2021-04-10 (×2): qty 0.2

## 2021-04-10 MED ORDER — ACETAMINOPHEN 325 MG PO TABS: 650.0000 mg | ORAL_TABLET | Freq: Four times a day (QID) | ORAL | Status: DC | PRN

## 2021-04-10 MED ORDER — IPRATROPIUM-ALBUTEROL 0.5-2.5 (3) MG/3ML IN SOLN
3.0000 mL | RESPIRATORY_TRACT | Status: DC | PRN
  Filled 2021-04-10: qty 3

## 2021-04-10 MED ORDER — ATORVASTATIN CALCIUM 10 MG PO TABS
10.0000 mg | ORAL_TABLET | Freq: Every evening | ORAL | Status: DC
  Administered 2021-04-10 – 2021-04-16 (×7): 10 mg via ORAL
  Filled 2021-04-10 (×7): qty 1

## 2021-04-10 MED ORDER — OXYCODONE-ACETAMINOPHEN 5-325 MG PO TABS
1.0000 | ORAL_TABLET | Freq: Four times a day (QID) | ORAL | Status: DC | PRN
  Administered 2021-04-10 – 2021-04-17 (×27): 1 via ORAL
  Filled 2021-04-10 (×28): qty 1

## 2021-04-10 MED ORDER — LEVOTHYROXINE SODIUM 112 MCG PO TABS
112.0000 ug | ORAL_TABLET | Freq: Every day | ORAL | Status: DC
  Administered 2021-04-10 – 2021-04-17 (×8): 112 ug via ORAL
  Filled 2021-04-10 (×8): qty 1

## 2021-04-10 MED ORDER — POLYETHYLENE GLYCOL 3350 17 G PO PACK
17.0000 g | PACK | Freq: Every day | ORAL | Status: DC | PRN
  Administered 2021-04-11 – 2021-04-15 (×2): 17 g via ORAL
  Filled 2021-04-10 (×2): qty 1

## 2021-04-10 MED ORDER — HYDRALAZINE HCL 25 MG PO TABS
25.0000 mg | ORAL_TABLET | Freq: Three times a day (TID) | ORAL | Status: DC
  Administered 2021-04-10 – 2021-04-17 (×22): 25 mg via ORAL
  Filled 2021-04-10 (×22): qty 1

## 2021-04-10 MED ORDER — METHYLPREDNISOLONE SODIUM SUCC 125 MG IJ SOLR
60.0000 mg | Freq: Two times a day (BID) | INTRAMUSCULAR | Status: DC
  Administered 2021-04-10 (×2): 60 mg via INTRAVENOUS
  Filled 2021-04-10 (×3): qty 2

## 2021-04-10 MED ORDER — DILTIAZEM HCL ER COATED BEADS 180 MG PO CP24: 360.0000 mg | ORAL_CAPSULE | Freq: Every day | ORAL | Status: DC

## 2021-04-10 NOTE — Progress Notes (Signed)
PROGRESS NOTE    Laura [name]  OVZ:[id number] DOB: 04-Jun-1961 DOA: 04/09/2021 PCP: [name], MD    Brief Narrative:  60 year old female with past medical history of ASD status postrepair, paroxysmal atrial fibrillation status post DCCV, insulin-dependent diabetes mellitus type 2, PTSD, bipolar disorder, hypothyroidism, hyperlipidemia, obstructive sleep apnea on CPAP, hypogammaglobulinemia, hypertension and gastroesophageal reflux disease presents to [facility] emergency department via EMS due to shortness of breath and chest pain, admitted for asthma exacerbation and afib with RVR  Assessment & Plan:   Principal Problem:   Paroxysmal atrial fibrillation with rapid ventricular response ([place]) Active Problems:   Hypogammaglobulinemia ([place])   Type 2 diabetes mellitus with hyperglycemia, with long-term current use of insulin (HCC)   Hypothyroidism   Essential hypertension, benign   OSA on CPAP   Musculoskeletal chest pain   Severe persistent asthma with (acute) exacerbation   Mixed 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 ([place])   GERD without esophagitis  Principal Problem:   Paroxysmal atrial fibrillation with rapid ventricular response ([place])  Patient presenting with rapid atrial fibrillation and heart rates in excess of 140 bpm. Rapid atrial fibrillation is likely contributing to patient's ongoing respiratory distress HR controlled with cardizem gtt CT angiogram of the chest was performed on 6/29 and was negative for pulmonary embolism. Will transition off cardizem gtt to PO    Active Problems:   Severe persistent asthma with (acute) exacerbation Patient presenting with concurrent persisting asthma exacerbation Will keep patient on Solu-Medrol 60 mg IV every 12 for now Continue scheduled bronchodilator therapy with additional as needed nebulized treatments as necessary. Cont O2 as needed, wean as tolerated   Elevated troponin without  myocardial infarction  Minimally elevated troponins of 19 and 20 Flat trajectory of elevation makes plaque rupture extremely unlikely Cont to monitor on tele     Hypogammaglobulinemia ([place])  Continued outpatient management with intravenous immunoglobulin therapy every several weeks.     Type 2 diabetes mellitus with hyperglycemia, with long-term current use of insulin (HCC)  Continue home regimen of subcutaneous Lantus Accu-Cheks before every meal and nightly with sliding scale insulin Hyperglycemia likely will be exacerbated by ongoing steroid therapy. Hemoglobin A1c found to be 6.8% on 6/30. Cont SSI coverage     Hypothyroidism  Continue home regimen of Synthroid TSH normal     Essential hypertension, benign  Continue home regimen of antihypertensive therapy     OSA on CPAP  CPAP nightly per home regimen     Musculoskeletal chest pain  Patient presenting with intermittent left-sided chest discomfort that is atypical and likely related to musculoskeletal strain Cont with PRN analgesia Continue to monitor patient on telemetry     Mixed 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 ([place])   Continue home regimen of statin therapy     GERD without esophagitis Continue home regimen of proton pump inhibitor    DVT prophylaxis: Eliquis Code Status: Full Family Communication: Pt in room, family not at bedside  Status is: Observation  The patient will require care spanning > 2 midnights and should be moved to inpatient because: Inpatient level of care appropriate due to severity of illness  Dispo: The patient is from: Home              Anticipated d/c is to: Home              Patient currently is not medically stable to d/c.   Difficult to place patient No  Consultants:    Procedures:    Antimicrobials: Anti-infectives (From admission, onward)    None       Subjective: Still complaining of sob  Objective: Vitals:   04/10/21  0231 04/10/21 0555 04/10/21 0601 04/10/21 1329  BP: (!) 152/58  139/71 (!) 168/69  Pulse: 65  67 66  Resp: 18   18  Temp: 98.7 F (37.1 C)   98.2 F (36.8 C)  TempSrc: Oral   Oral  SpO2: 97% 98% 98% 97%  Weight:      Height:        Intake/Output Summary (Last 24 hours) at 04/10/2021 1827 Last data filed at 04/10/2021 1515 Gross per 24 hour  Intake 166.08 ml  Output 1250 ml  Net -1083.92 ml   Filed Weights   04/10/21 0017 04/10/21 0225  Weight: 81.6 kg 85.3 kg    Examination: General exam: Awake, laying in bed, in nad Respiratory system: Actively coughing, end-expiratory wheezing throughout Cardiovascular system: regular rate, s1, s2 Gastrointestinal system: Soft, nondistended, positive BS Central nervous system: CN2-12 grossly intact, strength intact Extremities: Perfused, no clubbing Skin: Normal skin turgor, no notable skin lesions seen Psychiatry: Mood normal // no visual hallucinations   Data Reviewed: I have personally reviewed following labs and imaging studies  CBC: Recent Labs  Lab 04/07/21 1814 04/08/21 0536 04/09/21 2202 04/10/21 0500  WBC 4.1 3.2* 8.3 7.8  NEUTROABS  --   --  6.1 4.4  HGB 14.6 12.5 13.5 12.9  HCT 41.2 35.9* 38.2 36.1  MCV 92.0 94.0 92.9 92.3  PLT 193 162 233 637   Basic Metabolic Panel: Recent Labs  Lab 04/07/21 1814 04/08/21 0536 04/09/21 2202 04/10/21 0244 04/10/21 0500  NA 135 130* 136  --  139  K 4.2 3.8 3.9  --  3.6  CL 101 97* 103  --  105  CO2 22 22 25   --  24  GLUCOSE 455* 592* 370*  --  264*  BUN 32* 30* 22*  --  20  CREATININE 1.05* 0.82 0.95  --  0.87  CALCIUM 9.1 8.8* 8.9  --  9.1  MG  --  2.1  --  1.9 1.8  PHOS  --  3.0  --   --   --    GFR: Estimated Creatinine Clearance: 73.5 mL/min (by C-G formula based on SCr of 0.87 mg/dL). Liver Function Tests: Recent Labs  Lab 04/08/21 0536 04/09/21 2202 04/10/21 0500  AST 13* 19 17  ALT 14 19 15   ALKPHOS 77 77 64  BILITOT 0.6 0.7 0.5  PROT 6.6 6.7 6.3*   ALBUMIN 3.6 3.8 3.6   No results for input(s): LIPASE, AMYLASE in the last 168 hours. Recent Labs  Lab 04/10/21 0243  AMMONIA 37*   Coagulation Profile: Recent Labs  Lab 04/08/21 0536  INR 1.1   Cardiac Enzymes: No results for input(s): CKTOTAL, CKMB, CKMBINDEX, TROPONINI in the last 168 hours. BNP (last 3 results) No results for input(s): PROBNP in the last 8760 hours. HbA1C: Recent Labs    04/08/21 0536  HGBA1C 6.8*   CBG: Recent Labs  Lab 04/09/21 2210 04/10/21 0432 04/10/21 0646 04/10/21 1156 04/10/21 1533  GLUCAP 351* 243* 316* 324* 263*   Lipid Profile: No results for input(s): CHOL, HDL, LDLCALC, TRIG, CHOLHDL, LDLDIRECT in the last 72 hours. Thyroid Function Tests: Recent Labs    04/10/21 0244  TSH 2.048   Anemia Panel: No results for input(s): VITAMINB12, FOLATE, FERRITIN, TIBC, IRON, RETICCTPCT in the  last 72 hours. Sepsis Labs: No results for input(s): PROCALCITON, LATICACIDVEN in the last 168 hours.  Recent Results (from the past 240 hour(s))  Resp Panel by RT-PCR (Flu A&B, Covid) Nasopharyngeal Swab     Status: None   Collection Time: 04/07/21  8:21 PM   Specimen: Nasopharyngeal Swab; Nasopharyngeal(NP) swabs in vial transport medium  Result Value Ref Range Status   SARS Coronavirus 2 by RT PCR NEGATIVE NEGATIVE Final    Comment: (NOTE) SARS-CoV-2 target nucleic acids are NOT DETECTED.  The SARS-CoV-2 RNA is generally detectable in upper respiratory specimens during the acute phase of infection. The lowest concentration of SARS-CoV-2 viral copies this assay can detect is 138 copies/mL. A negative result does not preclude SARS-Cov-2 infection and should not be used as the sole basis for treatment or other patient management decisions. A negative result may occur with  improper specimen collection/handling, submission of specimen other than nasopharyngeal swab, presence of viral mutation(s) within the areas targeted by this assay, and  inadequate number of viral copies(<138 copies/mL). A negative result must be combined with clinical observations, patient history, and epidemiological information. The expected result is Negative.  Fact Sheet for Patients:  EntrepreneurPulse.com.au  Fact Sheet for Healthcare Providers:  IncredibleEmployment.be  This test is no t yet approved or cleared by the [place] FDA and  has been authorized for detection and/or diagnosis of SARS-CoV-2 by FDA under an Emergency Use Authorization (EUA). This EUA will remain  in effect (meaning this test can be used) for the duration of the COVID-19 declaration under Section 564(b)(1) of the Act, 21 U.S.C.section 360bbb-3(b)(1), unless the authorization is terminated  or revoked sooner.       Influenza A by PCR NEGATIVE NEGATIVE Final   Influenza B by PCR NEGATIVE NEGATIVE Final    Comment: (NOTE) The Xpert Xpress SARS-CoV-2/FLU/RSV plus assay is intended as an aid in the diagnosis of influenza from Nasopharyngeal swab specimens and should not be used as a sole basis for treatment. Nasal washings and aspirates are unacceptable for Xpert Xpress SARS-CoV-2/FLU/RSV testing.  Fact Sheet for Patients: EntrepreneurPulse.com.au  Fact Sheet for Healthcare Providers: IncredibleEmployment.be  This test is not yet approved or cleared by the [place] FDA and has been authorized for detection and/or diagnosis of SARS-CoV-2 by FDA under an Emergency Use Authorization (EUA). This EUA will remain in effect (meaning this test can be used) for the duration of the COVID-19 declaration under Section 564(b)(1) of the Act, 21 U.S.C. section 360bbb-3(b)(1), unless the authorization is terminated or revoked.  Performed at [facility], [address], [place], [place] [address]   SARS CORONAVIRUS 2 (TAT 6-24 HRS) Nasopharyngeal Nasopharyngeal Swab     Status: None   Collection  Time: 04/10/21 12:28 AM   Specimen: Nasopharyngeal Swab  Result Value Ref Range Status   SARS Coronavirus 2 NEGATIVE NEGATIVE Final    Comment: (NOTE) SARS-CoV-2 target nucleic acids are NOT DETECTED.  The SARS-CoV-2 RNA is generally detectable in upper and lower respiratory specimens during the acute phase of infection. Negative results do not preclude SARS-CoV-2 infection, do not rule out co-infections with other pathogens, and should not be used as the sole basis for treatment or other patient management decisions. Negative results must be combined with clinical observations, patient history, and epidemiological information. The expected result is Negative.  Fact Sheet for Patients: SugarRoll.be  Fact Sheet for Healthcare Providers: https://www.woods-mathews.com/  This test is not yet approved or cleared by the [place] FDA and  has been authorized for detection and/or diagnosis of SARS-CoV-2 by FDA under an Emergency Use Authorization (EUA). This EUA will remain  in effect (meaning this test can be used) for the duration of the COVID-19 declaration under Se ction 564(b)(1) of the Act, 21 U.S.C. section 360bbb-3(b)(1), unless the authorization is terminated or revoked sooner.  Performed at [facility], [place] [address], [place], [place] [address]      Radiology Studies: DG Chest Portable 1 View  Result Date: 04/09/2021 CLINICAL DATA:  Shortness of breath and chest pain EXAM: PORTABLE CHEST 1 VIEW COMPARISON:  04/07/2021 FINDINGS: No focal opacity or pleural effusion. Stable cardiomediastinal silhouette. Mitral annular calcification. No pneumothorax. IMPRESSION: No active disease. Electronically Signed   By: Donavan Foil M.D.   On: 04/09/2021 22:51    Scheduled Meds:  apixaban  5 mg Oral BID   atorvastatin  10 mg Oral QPM   budesonide  1 mg Nebulization BID   diltiazem  60 mg Oral Q8H   divalproex  1,000 mg Oral QHS    divalproex  500 mg Oral Daily   gabapentin  300 mg Oral TID   hydrALAZINE  25 mg Oral TID   insulin aspart  0-20 Units Subcutaneous TID WC   insulin aspart  0-5 Units Subcutaneous QHS   insulin aspart  8 Units Subcutaneous TID WC   insulin glargine  50 Units Subcutaneous QHS   ipratropium-albuterol  3 mL Nebulization Q6H   levothyroxine  112 mcg Oral Q0600   methylPREDNISolone (SOLU-MEDROL) injection  60 mg Intravenous BID   metoCLOPramide  5 mg Oral TID WC   montelukast  10 mg Oral Daily   pantoprazole  40 mg Oral BID AC   topiramate  100 mg Oral BID   torsemide  20 mg Oral Daily   umeclidinium bromide  1 puff Inhalation Daily   Continuous Infusions:  diltiazem (CARDIZEM) infusion Stopped (04/10/21 1720)     LOS: 0 days   Marylu Lund, MD [facility] Pager On Amion  If 7PM-7AM, please contact night-coverage 04/10/2021, 6:27 PM

## 2021-04-10 NOTE — H&P (Signed)
History and Physical    Laura [name] MWN:[id number] DOB: 05-16-1961 DOA: 04/09/2021  PCP: [name], MD  Patient coming from: Home via EMS   Chief Complaint:  Chief Complaint  Patient presents with   Chest Pain     HPI:    60 year old female with past medical history of ASD status postrepair, paroxysmal atrial fibrillation status post DCCV, insulin-dependent diabetes mellitus type 2, PTSD, bipolar disorder, hypothyroidism, hyperlipidemia, obstructive sleep apnea on CPAP, hypogammaglobulinemia, hypertension and gastroesophageal reflux disease presents to [facility] emergency department via EMS due to shortness of breath and chest pain.  Of note, patient was recently hospitalized at [facility] from 6/29 until 6/30.  Patient presented with shortness of breath and was felt to suffer from an acute asthma exacerbation.  Patient was hospitalized and treated with a course of aggressive bronchodilator therapy and steroids.  Patient was felt to quickly improve and was discharged on 6/30 with a course of oral prednisone.  Patient explains that shortly after her discharge from the hospital on 6/30 she continued to experience severe shortness of breath.  Patient states that her shortness of breath is severe in intensity, worse with minimal exertion and improved with rest.  Patient explains that her shortness of breath has been associated with a persisting cough, intermittently productive with green sputum.  Patient also complains of associated intermittent low-grade temperatures in the 99 range.  Patient states that her symptoms of shortness of breath and cough were associated with chest pain.  Patient describes chest pain as sharp in quality, primarily located in the left anterior chest, nonradiating and worse with deep inspiration or cough.  Patient's symptoms continue to persist overnight and on 7/1 the patient called EMS for evaluation.  Patient was promptly evaluated and  determined that the patient was in rapid atrial fibrillation ongoing respiratory distress.  EMS noted the patient was exhibiting ST segment depressions in the lateral leads.  Patient was brought into [facility] for evaluation.  In route, patient complained of chest discomfort and was administered sublingual nitroglycerin with improvement in chest pain.  Upon evaluation in the emergency department patient was clinically felt to be suffering from persisting asthma exacerbation.  Patient was administered nebulized bronchodilator therapy.  Concerning patient's rapid atrial fibrillation patient was placed on a diltiazem infusion.  Due to patient's ongoing asthma exacerbation and rapid atrial fibrillation requiring diltiazem infusion the hospitalist group was then called to assess the patient for admission to the hospital.  Patient complains  Review of Systems:   Review of Systems  Constitutional:  Positive for fever and malaise/fatigue.  Respiratory:  Positive for cough, sputum production, shortness of breath and wheezing.   Cardiovascular:  Positive for chest pain.  Neurological:  Positive for weakness.  All other systems reviewed and are negative.  Past Medical History:  Diagnosis Date   Asthma    Atrial fibrillation ([place])    Bipolar disorder ([place])    Bronchiectasis ([place])    Carpal tunnel syndrome of right wrist    CHF (congestive heart failure) (HCC)    Complication of anesthesia    "my Dr in [place] told me I had an allergic reaction to the combination of the pain meds and the anesthesia" she does not remember what happened but "I eneded up in the ICU for 5 days".   H/O congenital atrial septal defect (ASD) repair    Hypogammaglobulinemia ([place])    Hypothyroid    IgE deficiency (HCC)    Neuropathy  Neuropathy    Osteoarthritis    Pinched nerve in neck    PTSD (post-traumatic stress disorder)    Recurrent sinusitis    Short-term memory loss    Sleep apnea    TIA  (transient ischemic attack)    Tremors of nervous system    seeing PCP for this.   Type 2 diabetes mellitus (HCC)    Urticaria    Wound infection     Past Surgical History:  Procedure Laterality Date   ASD REPAIR  1968   BIOPSY  01/26/2021   Procedure: BIOPSY;  Surgeon: [name], DO;  [facility];  Service: Endoscopy;;  gastric   CARDIAC SURGERY     CARDIOVERSION     CARDIOVERSION N/A 08/29/2018   Procedure: CARDIOVERSION;  Surgeon: Arnoldo Lenis, MD;  Location: [facility];  Service: Endoscopy;  Laterality: N/A;   [place]   COLONOSCOPY WITH PROPOFOL N/A 01/26/2021   Procedure: COLONOSCOPY WITH PROPOFOL;  Surgeon: [name], DO;  Location: [facility];  Service: Endoscopy;  Laterality: N/A;  am appt, diabetic   ESOPHAGOGASTRODUODENOSCOPY (EGD) WITH PROPOFOL N/A 01/26/2021   Procedure: ESOPHAGOGASTRODUODENOSCOPY (EGD) WITH PROPOFOL;  Surgeon: [name], DO;  [facility];  Service: Endoscopy;  Laterality: N/A;   LEFT HEART CATH AND CORONARY ANGIOGRAPHY N/A 08/30/2019   Procedure: LEFT HEART CATH AND CORONARY ANGIOGRAPHY;  Surgeon: [name], MD;  Location: [place] [facility];  Service: Cardiovascular;  Laterality: N/A;   NASAL SINUS SURGERY  2017   POLYPECTOMY  01/26/2021   Procedure: POLYPECTOMY;  Surgeon: [name], DO;  Location[facility];  Service: Endoscopy;;  colon     reports that she quit smoking about 14 years ago. Her smoking use included cigarettes. She started smoking about 42 years ago. She has a 28.00 pack-year smoking history. She has never used smokeless tobacco. She reports previous alcohol use. She reports previous drug use.  Allergies  Allergen Reactions   Ativan [Lorazepam] Hives   Phenergan [Promethazine Hcl] Hives    Family History  Problem Relation Age of Onset   Hypertension Mother    Stroke Mother    Kidney disease Mother    Heart attack Mother     Alcohol abuse Mother    Other Father        ?colon cancer   Kidney disease Sister    Heart disease Brother    Stroke Maternal Aunt    Heart disease Maternal Aunt    Hypertension Sister    Bipolar disorder Sister    Thyroid disease Sister    Prostate cancer Brother    Thyroid disease Daughter    Hashimoto's thyroiditis Daughter    Celiac disease Daughter    Allergic rhinitis Daughter    Asthma Neg Hx      Prior to Admission medications   Medication Sig Start Date End Date Taking? Authorizing Provider  acetaminophen (TYLENOL) 325 MG tablet Take 2 tablets (650 mg total) by mouth every 6 (six) hours as needed for mild pain, fever or headache. 08/11/18   Roxan Hockey, MD  albuterol (PROVENTIL) (2.5 MG/3ML) 0.083% nebulizer solution Take 3 mLs (2.5 mg total) by nebulization every 4 (four) hours as needed for wheezing or shortness of breath. 04/07/21   Althea Charon, FNP  albuterol (VENTOLIN HFA) 108 (90 Base) MCG/ACT inhaler INHALE 2 PUFFS INTO THE LUNGS EVERY 6 HOURS AS NEEDED FOR WHEEZING OR SHORTNESS OF  BREATH 04/07/21   Althea Charon, FNP  apixaban (ELIQUIS) 5 MG TABS tablet Take 5 mg by mouth 2 (two) times daily.    [provider]  atorvastatin (LIPITOR) 10 MG tablet Take 10 mg by mouth every evening.     [provider]  BD PEN NEEDLE NANO 2ND GEN 32G X 4 MM MISC SMARTSIG:Injection 4 Times Daily 05/20/20   [provider]  benzonatate (TESSALON) 200 MG capsule Take 1 capsule (200 mg total) by mouth 3 (three) times daily as needed for cough. 04/08/21   Roxan Hockey, MD  blood glucose meter kit and supplies 1 each by Other route 4 (four) times daily. Dispense based on patient and insurance preference. Use up to four times daily as directed. (FOR ICD-10 E10.9, E11.9). 01/27/21   Cassandria Anger, MD  Cholecalciferol (VITAMIN D3) 125 MCG (5000 UT) CAPS Take 1 capsule (5,000 Units total) by mouth daily. 11/25/20   Cassandria Anger, MD  Continuous  Blood Gluc Receiver (FREESTYLE LIBRE 2 READER) DEVI As directed 12/10/20   Cassandria Anger, MD  Continuous Blood Gluc Sensor (FREESTYLE LIBRE 2 SENSOR) MISC 1 Piece by Does not apply route every 14 (fourteen) days. 12/10/20   Cassandria Anger, MD  diltiazem (CARDIZEM CD) 360 MG 24 hr capsule TAKE 1 CAPSULE(360 MG) BY MOUTH DAILY Patient taking differently: Take 360 mg by mouth daily. 03/01/21   Arnoldo Lenis, MD  divalproex (DEPAKOTE ER) 500 MG 24 hr tablet 500 mg in AM, 1000 mg at night 02/04/21   Hisada, Elie Goody, MD  ELDERBERRY PO Take 4 g by mouth daily. Gummies 2 g each Patient not taking: Reported on 04/07/2021    [provider]  EPINEPHrine 0.3 mg/0.3 mL IJ SOAJ injection Inject 0.3 mg into the muscle as needed for anaphylaxis. 01/17/20   [provider]  FASENRA PEN 30 MG/ML SOAJ INJECT $RemoveBef'30MG'iSpdmExXYe$  SUBCUTANEOUSLY  EVERY 8 WEEKS (GIVEN AT MD  OFFICE) Patient taking differently: Inject 30 mg into the skin See admin instructions. Every eight weeks 12/21/20   Valentina Shaggy, MD  ferrous sulfate 324 MG TBEC Take 324 mg by mouth daily with breakfast.    [provider]  fluticasone (FLOVENT HFA) 220 MCG/ACT inhaler 2 puffs twice daily with spacer2 puffs twice daily with spacer 04/08/21   Althea Charon, FNP  gabapentin (NEURONTIN) 300 MG capsule Take 300 mg by mouth 3 (three) times daily.     [provider]  hydrALAZINE (APRESOLINE) 25 MG tablet Take 25 mg by mouth 3 (three) times daily.    [provider]  hydrOXYzine (ATARAX/VISTARIL) 25 MG tablet Take 1 tablet (25 mg total) by mouth every 6 (six) hours as needed for anxiety or nausea. 08/11/18   Roxan Hockey, MD  Immune Globulin, Human, 4 GM/20ML SOLN Inject 100 mLs into the skin every 14 (fourteen) days.    [provider]  LANTUS SOLOSTAR 100 UNIT/ML Solostar Pen Inject 40 Units into the skin at bedtime. 08/15/19   [provider]  levothyroxine (SYNTHROID, LEVOTHROID) 112  MCG tablet Take 112 mcg by mouth daily before breakfast.    [provider]  Magnesium Oxide 400 MG CAPS Take 1 capsule (400 mg total) by mouth daily. 11/04/19   Arnoldo Lenis, MD  Melatonin 3 MG TABS Take 3 mg by mouth at bedtime.    [provider]  metoCLOPramide (REGLAN) 5 MG tablet Take 5 mg by mouth 3 (three) times daily. 04/05/21   [provider]  montelukast (SINGULAIR) 10 MG tablet Take 1 tablet by mouth daily. 11/23/20   [provider]  Multiple Vitamins-Minerals (MULTIVITAMIN WITH MINERALS) tablet Take 1 tablet by mouth daily. Woman    [provider]  Multiple Vitamins-Minerals (MULTIVITAMIN WITH MINERALS) tablet Take 1 tablet by mouth daily. Vita Fusion sugar free 4 gram fiber Patient not taking: Reported on 04/07/2021    [provider]  Multiple Vitamins-Minerals (ZINC PO) Take by mouth. Patient not taking: No sig reported    [provider]  Nebulizer MISC Nebulizer tubing kit 10/21/20   Valentina Shaggy, MD  omeprazole (PRILOSEC) 20 MG capsule Take 1 capsule (20 mg total) by mouth 2 (two) times daily before a meal. 01/26/21 07/25/21  Carver, Elon Alas, DO  ondansetron (ZOFRAN) 4 MG tablet Take 1 tablet (4 mg total) by mouth every 8 (eight) hours as needed for nausea or vomiting. 07/13/19   Hassell Done Mary-Margaret, FNP  ONETOUCH ULTRA test strip USE TO CHECK BLOOD SUGAR FOUR TIMES DAILY 10/17/19   [provider]  Potassium Chloride ER 20 MEQ TBCR Take 1 tablet by mouth daily. 01/04/21   [provider]  predniSONE (DELTASONE) 20 MG tablet Take 2 tablets (40 mg total) by mouth daily with breakfast for 5 days. 04/08/21 04/13/21  Roxan Hockey, MD  Tiotropium Bromide Monohydrate (SPIRIVA RESPIMAT) 1.25 MCG/ACT AERS Inhale 2 puffs into the lungs daily. 04/08/21   Althea Charon, FNP  topiramate (TOPAMAX) 100 MG tablet Take 1 tablet (100 mg total) by mouth 2 (two) times daily. 12/04/20   Tat, Eustace Quail, DO   torsemide (DEMADEX) 20 MG tablet Take 2 tablets (40 mg total) by mouth daily. Patient taking differently: Take 20 mg by mouth daily. 11/03/20 04/07/21  Strader, Fransisco Hertz, PA-C  triamcinolone cream (KENALOG) 0.1 % Apply 1 application topically 2 (two) times daily. 03/15/21   [provider]  TRULICITY 6.30 ZS/0.1UX SOPN ADMINISTER 0.75 MG UNDER THE SKIN 1 TIME A WEEK Patient taking differently: Inject 0.75 mg into the vein every 7 (seven) days. 02/12/21   Cassandria Anger, MD  vitamin B-12 (CYANOCOBALAMIN) 1000 MCG tablet Take 1,000 mcg by mouth daily.    [provider]  vitamin C (ASCORBIC ACID) 500 MG tablet Take 500 mg by mouth daily. Power C immune support    [provider]    Physical Exam: Vitals:   04/10/21 0030 04/10/21 0200 04/10/21 0225 04/10/21 0231  BP: 116/87 (!) 154/69  (!) 152/58  Pulse: 94 78  65  Resp: $Remo'20 15  18  'hhQCm$ Temp:  32.3 F (36.5 C)  98.7 F (37.1 C)  TempSrc:  Oral  Oral  SpO2: 97% 98%  97%  Weight:   85.3 kg   Height:   '5\' 4"'$  (1.626 m)     Constitutional: Awake alert and oriented x3, patient is in respiratory distress.  Patient is obese. Skin: no rashes, no lesions, good skin turgor noted. Eyes: Pupils are equally reactive to light.  No evidence of scleral icterus or conjunctival pallor.  ENMT: Moist mucous membranes noted.  Posterior pharynx clear of any exudate or lesions.   Neck: normal, supple, no masses, no thyromegaly.  No evidence of jugular venous distension.   Respiratory: Coarse breath sounds bilaterally with intermittent expiratory wheezing.  Patient is in respiratory distress without evidence of. No accessory muscle use.  Cardiovascular: Irregularly irregular rhythm no murmurs / rubs / gallops. No extremity edema. 2+ pedal pulses. No carotid bruits.  Chest:  Generalized anterior chest wall tenderness without evidence of crepitus or deformity.   Back:   Nontender without crepitus or deformity. Abdomen: Abdomen is  generally tender but soft.  No evidence of intra-abdominal masses.  Positive bowel sounds noted in all quadrants.   Musculoskeletal: No joint deformity upper and lower extremities. Good ROM, no contractures. Normal muscle tone.  Neurologic: CN 2-12 grossly intact. Sensation intact.  Patient moving all 4 extremities spontaneously.  Patient is following all commands.  Patient is responsive to verbal stimuli.   Psychiatric: Patient exhibits anxious mood with a somewhat labile affect.  Patient seems to possess insight as to their current situation.     Labs on Admission: I have personally reviewed following labs and imaging studies -   CBC: Recent Labs  Lab 04/07/21 1814 04/08/21 0536 04/09/21 2202  WBC 4.1 3.2* 8.3  NEUTROABS  --   --  6.1  HGB 14.6 12.5 13.5  HCT 41.2 35.9* 38.2  MCV 92.0 94.0 92.9  PLT 193 162 979   Basic Metabolic Panel: Recent Labs  Lab 04/07/21 1814 04/08/21 0536 04/09/21 2202 04/10/21 0244  NA 135 130* 136  --   K 4.2 3.8 3.9  --   CL 101 97* 103  --   CO2 $Re'22 22 25  'Oli$ --   GLUCOSE 455* 592* 370*  --   BUN 32* 30* 22*  --   CREATININE 1.05* 0.82 0.95  --   CALCIUM 9.1 8.8* 8.9  --   MG  --  2.1  --  1.9  PHOS  --  3.0  --   --    GFR: Estimated Creatinine Clearance: 67.3 mL/min (by C-G formula based on SCr of 0.95 mg/dL). Liver Function Tests: Recent Labs  Lab 04/08/21 0536 04/09/21 2202  AST 13* 19  ALT 14 19  ALKPHOS 77 77  BILITOT 0.6 0.7  PROT 6.6 6.7  ALBUMIN 3.6 3.8   No results for input(s): LIPASE, AMYLASE in the last 168 hours. Recent Labs  Lab 04/10/21 0243  AMMONIA 37*   Coagulation Profile: Recent Labs  Lab 04/08/21 0536  INR 1.1   Cardiac Enzymes: No results for input(s): CKTOTAL, CKMB, CKMBINDEX, TROPONINI in the last 168 hours. BNP (last 3 results) No results for input(s): PROBNP in the last 8760 hours. HbA1C: Recent Labs    04/08/21 0536  HGBA1C 6.8*   CBG: Recent Labs  Lab 04/08/21 0753 04/08/21 1014  04/08/21 1203 04/09/21 2210  GLUCAP 577* 438* 317* 351*   Lipid Profile: No results for input(s): CHOL, HDL, LDLCALC, TRIG, CHOLHDL, LDLDIRECT in the last 72 hours. Thyroid Function Tests: Recent Labs    04/10/21 0244  TSH 2.048   Anemia Panel: No results for input(s): VITAMINB12, FOLATE, FERRITIN, TIBC, IRON, RETICCTPCT in the last 72 hours. Urine analysis:    Component Value Date/Time   COLORURINE COLORLESS (A) 12/08/2020 1315   APPEARANCEUR CLEAR 12/08/2020 1315   LABSPEC 1.006 12/08/2020 1315   PHURINE 5.0 12/08/2020 1315   GLUCOSEU NEGATIVE 12/08/2020 1315   HGBUR NEGATIVE 12/08/2020 1315   BILIRUBINUR NEGATIVE 12/08/2020 1315   KETONESUR NEGATIVE 12/08/2020 1315   PROTEINUR NEGATIVE 12/08/2020 1315   NITRITE NEGATIVE 12/08/2020 1315   LEUKOCYTESUR NEGATIVE 12/08/2020 1315    Radiological Exams on Admission - Personally Reviewed: DG Chest Portable 1 View  Result Date: 04/09/2021 CLINICAL DATA:  Shortness of breath and chest pain EXAM: PORTABLE CHEST 1 VIEW COMPARISON:  04/07/2021 FINDINGS: No focal opacity or pleural effusion. Stable cardiomediastinal  silhouette. Mitral annular calcification. No pneumothorax. IMPRESSION: No active disease. Electronically Signed   By: Donavan Foil M.D.   On: 04/09/2021 22:51    EKG: Personally reviewed.  Rhythm is atrial fibrillation with heart rate of 125 bpm.  QTc 493.  No dynamic ST segment changes appreciated.  Assessment/Plan Principal Problem:   Paroxysmal atrial fibrillation with rapid ventricular response [facility])  Patient presenting with rapid atrial fibrillation and heart rates in excess of 140 bpm. Rapid atrial fibrillation is likely contributing to patient's ongoing respiratory distress Patient has already been initiated on intravenous diltiazem infusion by the emergency department staff which will be titrated to achieve rate control Obtaining electrolytes to ensure that potassium levels and magnesium levels are at target and  will replete if necessary Obtaining TSH CT angiogram of the chest was performed on 6/29 and was negative for pulmonary embolism. Monitoring patient on telemetry Cycling cardiac enzymes  Active Problems:   Severe persistent asthma with (acute) exacerbation  Patient presenting with concurrent persisting asthma exacerbation I believe that patient has been slow to recover from her asthma exacerbation to development of rapid atrial fibrillation Will keep patient on Solu-Medrol 60 mg IV every 12 for now Continue scheduled bronchodilator therapy with additional as needed nebulized treatments as necessary. Once atrial fibrillation is controlled, regimen of steroids and bronchodilator therapy can likely rapidly be de-escalated. Supplemental oxygen for bouts of hypoxia.  Elevated troponin without myocardial infarction  Minimally elevated troponins of 19 and 20 Flat trajectory of elevation makes plaque rupture extremely unlikely Continue to monitor patient on telemetry    Hypogammaglobulinemia ([place])  Continued outpatient management with intravenous immunoglobulin therapy every several weeks.    Type 2 diabetes mellitus with hyperglycemia, with long-term current use of insulin (HCC)  Continue home regimen of subcutaneous Lantus Accu-Cheks before every meal and nightly with sliding scale insulin Hyperglycemia likely will be exacerbated by ongoing steroid therapy. Hemoglobin A1c found to be 6.8% on 6/30.    Hypothyroidism  Continue home regimen of Synthroid    Essential hypertension, benign  Continue home regimen of antihypertensive therapy    OSA on CPAP  CPAP nightly per home regimen    Musculoskeletal chest pain  Patient presenting with intermittent left-sided chest discomfort that is atypical and likely related to musculoskeletal strain As needed analgesics for substantial pain Minimal elevation of cardiac enzymes with flat trajectory of elevation making plaque rupture  extremely unlikely Continue to monitor patient on telemetry    Mixed 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 ([place])  Continue home regimen of statin therapy    GERD without esophagitis Continue home regimen of proton pump inhibitor   Code Status:  Full code Family Communication: deferred   Status is: Observation  The patient remains OBS appropriate and will d/c before 2 midnights.  Dispo: The patient is from: Home              Anticipated d/c is to: Home              Patient currently is not medically stable to d/c.   Difficult to place patient No        Vernelle Emerald MD [facility] Pager [phone]  If 7PM-7AM, please contact night-coverage [email or web address] Use universal [place] password for that web site. If you do not have the password, please call the hospital operator.  04/10/2021, 4:02 AM

## 2021-04-10 NOTE — Progress Notes (Signed)
CPAP 11cmH20 (home setting) with FFM and 2L humidified air (for comfort) bled in, set up at bedside for pt to use throughout the day if she chooses and QHS. Pt to notify for RT if she needs any further assistance with CPAP settings/mask. RT will continue to monitor.

## 2021-04-11 DIAGNOSIS — I4891 Unspecified atrial fibrillation: Secondary | ICD-10-CM

## 2021-04-11 DIAGNOSIS — I10 Essential (primary) hypertension: Secondary | ICD-10-CM | POA: Diagnosis not present

## 2021-04-11 DIAGNOSIS — I480 Paroxysmal atrial fibrillation: Secondary | ICD-10-CM | POA: Diagnosis not present

## 2021-04-11 LAB — GLUCOSE, CAPILLARY
Glucose-Capillary: 372 mg/dL — ABNORMAL HIGH (ref 70–99)
Glucose-Capillary: 445 mg/dL — ABNORMAL HIGH (ref 70–99)
Glucose-Capillary: 187 mg/dL — ABNORMAL HIGH (ref 70–99)
Glucose-Capillary: 241 mg/dL — ABNORMAL HIGH (ref 70–99)

## 2021-04-11 MED ORDER — METHYLPREDNISOLONE SODIUM SUCC 40 MG IJ SOLR
40.0000 mg | Freq: Two times a day (BID) | INTRAMUSCULAR | Status: AC
  Administered 2021-04-11 – 2021-04-14 (×8): 40 mg via INTRAVENOUS
  Filled 2021-04-11 (×7): qty 1

## 2021-04-11 MED ORDER — IPRATROPIUM-ALBUTEROL 0.5-2.5 (3) MG/3ML IN SOLN
3.0000 mL | Freq: Three times a day (TID) | RESPIRATORY_TRACT | Status: DC
  Administered 2021-04-11 – 2021-04-15 (×12): 3 mL via RESPIRATORY_TRACT
  Filled 2021-04-11 (×13): qty 3

## 2021-04-11 MED ORDER — METHYLPREDNISOLONE SODIUM SUCC 40 MG IJ SOLR
40.0000 mg | Freq: Two times a day (BID) | INTRAMUSCULAR | Status: DC
  Filled 2021-04-11: qty 1

## 2021-04-11 MED ORDER — INSULIN ASPART 100 UNIT/ML IJ SOLN
12.0000 [IU] | Freq: Three times a day (TID) | INTRAMUSCULAR | Status: DC
  Administered 2021-04-11 – 2021-04-13 (×7): 12 [IU] via SUBCUTANEOUS

## 2021-04-11 MED ORDER — INSULIN GLARGINE 100 UNIT/ML ~~LOC~~ SOLN
40.0000 [IU] | Freq: Every day | SUBCUTANEOUS | Status: DC
  Administered 2021-04-11 – 2021-04-12 (×2): 40 [IU] via SUBCUTANEOUS
  Filled 2021-04-11 (×3): qty 0.4

## 2021-04-11 NOTE — Progress Notes (Signed)
PROGRESS NOTE    Laura [name]  KJZ:[id number] DOB: Dec 24, 1960 DOA: 04/09/2021 PCP: [name], MD    Brief Narrative:  60 year old female with past medical history of ASD status postrepair, paroxysmal atrial fibrillation status post DCCV, insulin-dependent diabetes mellitus type 2, PTSD, bipolar disorder, hypothyroidism, hyperlipidemia, obstructive sleep apnea on CPAP, hypogammaglobulinemia, hypertension and gastroesophageal reflux disease presents to [facility] emergency department via EMS due to shortness of breath and chest pain, admitted for asthma exacerbation and afib with RVR  Assessment & Plan:   Principal Problem:   Paroxysmal atrial fibrillation with rapid ventricular response ([place]) Active Problems:   Hypogammaglobulinemia ([place])   Type 2 diabetes mellitus with hyperglycemia, with long-term current use of insulin (HCC)   Hypothyroidism   Essential hypertension, benign   OSA on CPAP   Musculoskeletal chest pain   Severe persistent asthma with (acute) exacerbation   Mixed 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 ([employer])   GERD without esophagitis   Asthma exacerbation  Principal Problem:   Paroxysmal atrial fibrillation with rapid ventricular response ([place])  Patient presenting with rapid atrial fibrillation and heart rates in excess of 140 bpm. Rapid atrial fibrillation is likely contributing to patient's ongoing respiratory distress HR now controlled with PO cardizem, will continue   Active Problems:   Severe persistent asthma with (acute) exacerbation Patient presenting with concurrent persisting asthma exacerbation Will keep patient on Solu-Medrol 40 mg IV every 12 for now Continue scheduled bronchodilator therapy with additional as needed nebulized treatments as necessary. Cont O2 as needed, wean as tolerated Still with marked wheezing throughout and decreased BS   Elevated troponin without myocardial infarction  Minimally elevated  troponins of 19 and 20 Stable at this time     Hypogammaglobulinemia [facility])  Continued outpatient management with intravenous immunoglobulin therapy every several weeks.     Type 2 diabetes mellitus with hyperglycemia, with long-term current use of insulin (HCC)  Accu-Cheks before every meal and nightly with sliding scale insulin Hyperglycemia likely will be exacerbated by ongoing steroid therapy. Hemoglobin A1c found to be 6.8% on 6/30. Glucose elevated secondary to steroids, will increase lantus to 40 units and increase meal coverage to 12 units     Hypothyroidism  Continue home regimen of Synthroid TSH noted to be normal     Essential hypertension, benign  Continue home regimen of antihypertensive therapy     OSA on CPAP  CPAP nightly per home regimen     Musculoskeletal chest pain  Patient presenting with intermittent left-sided chest discomfort that is atypical and likely related to musculoskeletal strain Cont with analgesia as needed Continue to monitor patient on telemetry     Mixed 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 ([place])   Continue home regimen of statin therapy     GERD without esophagitis Continue home regimen of proton pump inhibitor    DVT prophylaxis: Eliquis Code Status: Full Family Communication: Pt in room, family not at bedside  Status is: Observation  The patient will require care spanning > 2 midnights and should be moved to inpatient because: Inpatient level of care appropriate due to severity of illness  Dispo: The patient is from: Home              Anticipated d/c is to: Home              Patient currently is not medically stable to d/c.   Difficult to place patient No    Consultants:  Procedures:    Antimicrobials: Anti-infectives (From admission, onward)    None       Subjective: Continues to cough with sob  Objective: Vitals:   04/11/21 0803 04/11/21 0812 04/11/21 1331 04/11/21 1344  BP:    (!) 154/68 (!) 154/68  Pulse: 78  84 85  Resp: 18  16 16   Temp:   98.4 F (36.9 C)   TempSrc:   Oral   SpO2: 100% 100% 97% 98%  Weight:      Height:        Intake/Output Summary (Last 24 hours) at 04/11/2021 1531 Last data filed at 04/11/2021 0458 Gross per 24 hour  Intake 729.68 ml  Output 1375 ml  Net -645.32 ml    Filed Weights   04/10/21 0017 04/10/21 0225  Weight: 81.6 kg 85.3 kg    Examination: General exam: Conversant, in no acute distress Respiratory system: normal chest rise, clear, no audible wheezing Cardiovascular system: regular rhythm, s1-s2 Gastrointestinal system: Nondistended, nontender, pos BS Central nervous system: No seizures, no tremors Extremities: No cyanosis, no joint deformities Skin: No rashes, no pallor Psychiatry: Affect normal // no auditory hallucinations   Data Reviewed: I have personally reviewed following labs and imaging studies  CBC: Recent Labs  Lab 04/07/21 1814 04/08/21 0536 04/09/21 2202 04/10/21 0500  WBC 4.1 3.2* 8.3 7.8  NEUTROABS  --   --  6.1 4.4  HGB 14.6 12.5 13.5 12.9  HCT 41.2 35.9* 38.2 36.1  MCV 92.0 94.0 92.9 92.3  PLT 193 162 233 024    Basic Metabolic Panel: Recent Labs  Lab 04/07/21 1814 04/08/21 0536 04/09/21 2202 04/10/21 0244 04/10/21 0500  NA 135 130* 136  --  139  K 4.2 3.8 3.9  --  3.6  CL 101 97* 103  --  105  CO2 22 22 25   --  24  GLUCOSE 455* 592* 370*  --  264*  BUN 32* 30* 22*  --  20  CREATININE 1.05* 0.82 0.95  --  0.87  CALCIUM 9.1 8.8* 8.9  --  9.1  MG  --  2.1  --  1.9 1.8  PHOS  --  3.0  --   --   --     GFR: Estimated Creatinine Clearance: 73.5 mL/min (by C-G formula based on SCr of 0.87 mg/dL). Liver Function Tests: Recent Labs  Lab 04/08/21 0536 04/09/21 2202 04/10/21 0500  AST 13* 19 17  ALT 14 19 15   ALKPHOS 77 77 64  BILITOT 0.6 0.7 0.5  PROT 6.6 6.7 6.3*  ALBUMIN 3.6 3.8 3.6    No results for input(s): LIPASE, AMYLASE in the last 168 hours. Recent Labs   Lab 04/10/21 0243  AMMONIA 37*    Coagulation Profile: Recent Labs  Lab 04/08/21 0536  INR 1.1    Cardiac Enzymes: No results for input(s): CKTOTAL, CKMB, CKMBINDEX, TROPONINI in the last 168 hours. BNP (last 3 results) No results for input(s): PROBNP in the last 8760 hours. HbA1C: No results for input(s): HGBA1C in the last 72 hours.  CBG: Recent Labs  Lab 04/10/21 1156 04/10/21 1533 04/10/21 2100 04/11/21 0736 04/11/21 1131  GLUCAP 324* 263* 89 372* 445*    Lipid Profile: No results for input(s): CHOL, HDL, LDLCALC, TRIG, CHOLHDL, LDLDIRECT in the last 72 hours. Thyroid Function Tests: Recent Labs    04/10/21 0244  TSH 2.048    Anemia Panel: No results for input(s): VITAMINB12, FOLATE, FERRITIN, TIBC, IRON, RETICCTPCT in the last 72  hours. Sepsis Labs: No results for input(s): PROCALCITON, LATICACIDVEN in the last 168 hours.  Recent Results (from the past 240 hour(s))  Resp Panel by RT-PCR (Flu A&B, Covid) Nasopharyngeal Swab     Status: None   Collection Time: 04/07/21  8:21 PM   Specimen: Nasopharyngeal Swab; Nasopharyngeal(NP) swabs in vial transport medium  Result Value Ref Range Status   SARS Coronavirus 2 by RT PCR NEGATIVE NEGATIVE Final    Comment: (NOTE) SARS-CoV-2 target nucleic acids are NOT DETECTED.  The SARS-CoV-2 RNA is generally detectable in upper respiratory specimens during the acute phase of infection. The lowest concentration of SARS-CoV-2 viral copies this assay can detect is 138 copies/mL. A negative result does not preclude SARS-Cov-2 infection and should not be used as the sole basis for treatment or other patient management decisions. A negative result may occur with  improper specimen collection/handling, submission of specimen other than nasopharyngeal swab, presence of viral mutation(s) within the areas targeted by this assay, and inadequate number of viral copies(<138 copies/mL). A negative result must be combined  with clinical observations, patient history, and epidemiological information. The expected result is Negative.  Fact Sheet for Patients:  EntrepreneurPulse.com.au  Fact Sheet for Healthcare Providers:  IncredibleEmployment.be  This test is no t yet approved or cleared by the [place] FDA and  has been authorized for detection and/or diagnosis of SARS-CoV-2 by FDA under an Emergency Use Authorization (EUA). This EUA will remain  in effect (meaning this test can be used) for the duration of the COVID-19 declaration under Section 564(b)(1) of the Act, 21 U.S.C.section 360bbb-3(b)(1), unless the authorization is terminated  or revoked sooner.       Influenza A by PCR NEGATIVE NEGATIVE Final   Influenza B by PCR NEGATIVE NEGATIVE Final    Comment: (NOTE) The Xpert Xpress SARS-CoV-2/FLU/RSV plus assay is intended as an aid in the diagnosis of influenza from Nasopharyngeal swab specimens and should not be used as a sole basis for treatment. Nasal washings and aspirates are unacceptable for Xpert Xpress SARS-CoV-2/FLU/RSV testing.  Fact Sheet for Patients: EntrepreneurPulse.com.au  Fact Sheet for Healthcare Providers: IncredibleEmployment.be  This test is not yet approved or cleared by the [place] FDA and has been authorized for detection and/or diagnosis of SARS-CoV-2 by FDA under an Emergency Use Authorization (EUA). This EUA will remain in effect (meaning this test can be used) for the duration of the COVID-19 declaration under Section 564(b)(1) of the Act, 21 U.S.C. section 360bbb-3(b)(1), unless the authorization is terminated or revoked.  Performed at [facility], [address], [place], [place] [address]   SARS CORONAVIRUS 2 (TAT 6-24 HRS) Nasopharyngeal Nasopharyngeal Swab     Status: None   Collection Time: 04/10/21 12:28 AM   Specimen: Nasopharyngeal Swab  Result Value Ref Range Status    SARS Coronavirus 2 NEGATIVE NEGATIVE Final    Comment: (NOTE) SARS-CoV-2 target nucleic acids are NOT DETECTED.  The SARS-CoV-2 RNA is generally detectable in upper and lower respiratory specimens during the acute phase of infection. Negative results do not preclude SARS-CoV-2 infection, do not rule out co-infections with other pathogens, and should not be used as the sole basis for treatment or other patient management decisions. Negative results must be combined with clinical observations, patient history, and epidemiological information. The expected result is Negative.  Fact Sheet for Patients: SugarRoll.be  Fact Sheet for Healthcare Providers: https://www.woods-mathews.com/  This test is not yet approved or cleared by the [place] FDA and  has been  authorized for detection and/or diagnosis of SARS-CoV-2 by FDA under an Emergency Use Authorization (EUA). This EUA will remain  in effect (meaning this test can be used) for the duration of the COVID-19 declaration under Se ction 564(b)(1) of the Act, 21 U.S.C. section 360bbb-3(b)(1), unless the authorization is terminated or revoked sooner.  Performed at [facility], [place] [address], [place], [place] [address]       Radiology Studies: DG Chest Portable 1 View  Result Date: 04/09/2021 CLINICAL DATA:  Shortness of breath and chest pain EXAM: PORTABLE CHEST 1 VIEW COMPARISON:  04/07/2021 FINDINGS: No focal opacity or pleural effusion. Stable cardiomediastinal silhouette. Mitral annular calcification. No pneumothorax. IMPRESSION: No active disease. Electronically Signed   By: Donavan Foil M.D.   On: 04/09/2021 22:51    Scheduled Meds:  apixaban  5 mg Oral BID   atorvastatin  10 mg Oral QPM   budesonide  1 mg Nebulization BID   diltiazem  60 mg Oral Q8H   divalproex  1,000 mg Oral QHS   divalproex  500 mg Oral Daily   gabapentin  300 mg Oral TID   hydrALAZINE  25 mg Oral  TID   insulin aspart  0-20 Units Subcutaneous TID WC   insulin aspart  0-5 Units Subcutaneous QHS   insulin aspart  12 Units Subcutaneous TID WC   insulin glargine  40 Units Subcutaneous QHS   ipratropium-albuterol  3 mL Nebulization TID   levothyroxine  112 mcg Oral Q0600   methylPREDNISolone (SOLU-MEDROL) injection  40 mg Intravenous BID   metoCLOPramide  5 mg Oral TID WC   montelukast  10 mg Oral Daily   pantoprazole  40 mg Oral BID AC   topiramate  100 mg Oral BID   torsemide  20 mg Oral Daily   umeclidinium bromide  1 puff Inhalation Daily   Continuous Infusions:     LOS: 1 day   Marylu Lund, MD [facility] Pager On Amion  If 7PM-7AM, please contact night-coverage 04/11/2021, 3:31 PM

## 2021-04-12 DIAGNOSIS — I480 Paroxysmal atrial fibrillation: Secondary | ICD-10-CM | POA: Diagnosis not present

## 2021-04-12 DIAGNOSIS — I4891 Unspecified atrial fibrillation: Secondary | ICD-10-CM | POA: Diagnosis not present

## 2021-04-12 DIAGNOSIS — I10 Essential (primary) hypertension: Secondary | ICD-10-CM | POA: Diagnosis not present

## 2021-04-12 LAB — COMPREHENSIVE METABOLIC PANEL
ALT: 16 U/L (ref 0–44)
AST: 14 U/L — ABNORMAL LOW (ref 15–41)
Albumin: 3.1 g/dL — ABNORMAL LOW (ref 3.5–5.0)
Alkaline Phosphatase: 60 U/L (ref 38–126)
Anion gap: 11 (ref 5–15)
BUN: 30 mg/dL — ABNORMAL HIGH (ref 6–20)
CO2: 23 mmol/L (ref 22–32)
Calcium: 8.8 mg/dL — ABNORMAL LOW (ref 8.9–10.3)
Chloride: 98 mmol/L (ref 98–111)
Creatinine, Ser: 1 mg/dL (ref 0.44–1.00)
GFR, Estimated: >60 mL/min (ref >60)
Glucose, Bld: 313 mg/dL — ABNORMAL HIGH (ref 70–99)
Potassium: 4.2 mmol/L (ref 3.5–5.1)
Sodium: 132 mmol/L — ABNORMAL LOW (ref 135–145)
Total Bilirubin: 0.5 mg/dL (ref 0.3–1.2)
Total Protein: 5.9 g/dL — ABNORMAL LOW (ref 6.5–8.1)

## 2021-04-12 LAB — GLUCOSE, CAPILLARY
Glucose-Capillary: 301 mg/dL — ABNORMAL HIGH (ref 70–99)
Glucose-Capillary: 203 mg/dL — ABNORMAL HIGH (ref 70–99)
Glucose-Capillary: 266 mg/dL — ABNORMAL HIGH (ref 70–99)
Glucose-Capillary: 267 mg/dL — ABNORMAL HIGH (ref 70–99)

## 2021-04-12 MED ORDER — LACTULOSE 10 GM/15ML PO SOLN
20.0000 g | ORAL | Status: AC
  Administered 2021-04-12: 20 g via ORAL
  Filled 2021-04-12: qty 30

## 2021-04-12 MED ORDER — DOCUSATE SODIUM 100 MG PO CAPS
100.0000 mg | ORAL_CAPSULE | Freq: Two times a day (BID) | ORAL | Status: DC
  Administered 2021-04-12 – 2021-04-17 (×10): 100 mg via ORAL
  Filled 2021-04-12 (×10): qty 1

## 2021-04-12 NOTE — Progress Notes (Signed)
PROGRESS NOTE    Laura [name]  SWH:[id number] DOB: 12-02-1960 DOA: 04/09/2021 PCP: [name], MD    Brief Narrative:  60 year old female with past medical history of ASD status postrepair, paroxysmal atrial fibrillation status post DCCV, insulin-dependent diabetes mellitus type 2, PTSD, bipolar disorder, hypothyroidism, hyperlipidemia, obstructive sleep apnea on CPAP, hypogammaglobulinemia, hypertension and gastroesophageal reflux disease presents to [facility] emergency department via EMS due to shortness of breath and chest pain, admitted for asthma exacerbation and afib with RVR  Assessment & Plan:   Principal Problem:   Paroxysmal atrial fibrillation with rapid ventricular response ([place]) Active Problems:   Hypogammaglobulinemia ([place])   Type 2 diabetes mellitus with hyperglycemia, with long-term current use of insulin (HCC)   Hypothyroidism   Essential hypertension, benign   OSA on CPAP   Musculoskeletal chest pain   Severe persistent asthma with (acute) exacerbation   Mixed 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 ([place])   GERD without esophagitis   Asthma exacerbation  Principal Problem:   Paroxysmal atrial fibrillation with rapid ventricular response ([place])  Patient presenting with rapid atrial fibrillation and heart rates in excess of 140 bpm. Rapid atrial fibrillation is likely contributing to patient's ongoing respiratory distress HR now controlled with PO cardizem, cont current regimen   Active Problems:   Severe persistent asthma with (acute) exacerbation Patient presenting with concurrent persisting asthma exacerbation Will keep patient on Solu-Medrol 40 mg IV every 12 for now Continue scheduled bronchodilator therapy with additional as needed nebulized treatments as necessary. Cont to wean o2 as tolerated This AM, continues with decreased bs but wheezing has improved   Elevated troponin without myocardial infarction  Minimally  elevated troponins of 19 and 20 Currently stable     Hypogammaglobulinemia ([place])  Continued outpatient management with intravenous immunoglobulin therapy every several weeks.     Type 2 diabetes mellitus with hyperglycemia, with long-term current use of insulin (HCC)  Accu-Cheks before every meal and nightly with sliding scale insulin Hyperglycemia likely will be exacerbated by ongoing steroid therapy. Hemoglobin A1c found to be 6.8% on 6/30. Glucose elevated secondary to steroids, will increase lantus to 40 units and increase meal coverage to 12 units Stable at this time     Hypothyroidism  Continue home regimen of Synthroid TSH noted to be normal     Essential hypertension, benign  Continue home regimen of antihypertensive therapy     OSA on CPAP  CPAP nightly per home regimen     Musculoskeletal chest pain  Patient presenting with intermittent left-sided chest discomfort that is atypical and likely related to musculoskeletal strain Cont with analgesia as needed Cont to monitor on tele     Mixed 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 ([place])   Continue home regimen of statin therapy     GERD without esophagitis Continue home regimen of proton pump inhibitor    DVT prophylaxis: Eliquis Code Status: Full Family Communication: Pt in room, family not at bedside  Status is: Inpatient  Inpatient level of care appropriate due to severity of illness  Dispo: The patient is from: Home              Anticipated d/c is to: Home              Patient currently is not medically stable to d/c.   Difficult to place patient No    Consultants:    Procedures:    Antimicrobials: Anti-infectives (From admission, onward)    None  Subjective: Still coughing  Objective: Vitals:   04/12/21 0739 04/12/21 0742 04/12/21 0743 04/12/21 1430  BP: (!) 158/65   (!) 156/76  Pulse: 64   92  Resp: 20   18  Temp: 97.7 F (36.5 C)   98.2 F (36.8 C)   TempSrc: Oral   Oral  SpO2: 100% 100% 98% 97%  Weight:      Height:        Intake/Output Summary (Last 24 hours) at 04/12/2021 1440 Last data filed at 04/12/2021 1242 Gross per 24 hour  Intake 120 ml  Output 2900 ml  Net -2780 ml    Filed Weights   04/10/21 0017 04/10/21 0225  Weight: 81.6 kg 85.3 kg    Examination: General exam: Awake, laying in bed, in nad Respiratory system: Actively coughing, slightly decreased BS in all fields Cardiovascular system: regular rate, s1, s2 Gastrointestinal system: Soft, nondistended, positive BS Central nervous system: CN2-12 grossly intact, strength intact Extremities: Perfused, no clubbing Skin: Normal skin turgor, no notable skin lesions seen Psychiatry: Mood normal // no visual hallucinations   Data Reviewed: I have personally reviewed following labs and imaging studies  CBC: Recent Labs  Lab 04/07/21 1814 04/08/21 0536 04/09/21 2202 04/10/21 0500  WBC 4.1 3.2* 8.3 7.8  NEUTROABS  --   --  6.1 4.4  HGB 14.6 12.5 13.5 12.9  HCT 41.2 35.9* 38.2 36.1  MCV 92.0 94.0 92.9 92.3  PLT 193 162 233 518    Basic Metabolic Panel: Recent Labs  Lab 04/07/21 1814 04/08/21 0536 04/09/21 2202 04/10/21 0244 04/10/21 0500 04/12/21 0131  NA 135 130* 136  --  139 132*  K 4.2 3.8 3.9  --  3.6 4.2  CL 101 97* 103  --  105 98  CO2 22 22 25   --  24 23  GLUCOSE 455* 592* 370*  --  264* 313*  BUN 32* 30* 22*  --  20 30*  CREATININE 1.05* 0.82 0.95  --  0.87 1.00  CALCIUM 9.1 8.8* 8.9  --  9.1 8.8*  MG  --  2.1  --  1.9 1.8  --   PHOS  --  3.0  --   --   --   --     GFR: Estimated Creatinine Clearance: 64 mL/min (by C-G formula based on SCr of 1 mg/dL). Liver Function Tests: Recent Labs  Lab 04/08/21 0536 04/09/21 2202 04/10/21 0500 04/12/21 0131  AST 13* 19 17 14*  ALT 14 19 15 16   ALKPHOS 77 77 64 60  BILITOT 0.6 0.7 0.5 0.5  PROT 6.6 6.7 6.3* 5.9*  ALBUMIN 3.6 3.8 3.6 3.1*    No results for input(s): LIPASE, AMYLASE in  the last 168 hours. Recent Labs  Lab 04/10/21 0243  AMMONIA 37*    Coagulation Profile: Recent Labs  Lab 04/08/21 0536  INR 1.1    Cardiac Enzymes: No results for input(s): CKTOTAL, CKMB, CKMBINDEX, TROPONINI in the last 168 hours. BNP (last 3 results) No results for input(s): PROBNP in the last 8760 hours. HbA1C: No results for input(s): HGBA1C in the last 72 hours.  CBG: Recent Labs  Lab 04/11/21 1131 04/11/21 1544 04/11/21 2123 04/12/21 0737 04/12/21 1155  GLUCAP 445* 187* 241* 301* 203*    Lipid Profile: No results for input(s): CHOL, HDL, LDLCALC, TRIG, CHOLHDL, LDLDIRECT in the last 72 hours. Thyroid Function Tests: Recent Labs    04/10/21 0244  TSH 2.048    Anemia Panel: No results for  input(s): VITAMINB12, FOLATE, FERRITIN, TIBC, IRON, RETICCTPCT in the last 72 hours. Sepsis Labs: No results for input(s): PROCALCITON, LATICACIDVEN in the last 168 hours.  Recent Results (from the past 240 hour(s))  Resp Panel by RT-PCR (Flu A&B, Covid) Nasopharyngeal Swab     Status: None   Collection Time: 04/07/21  8:21 PM   Specimen: Nasopharyngeal Swab; Nasopharyngeal(NP) swabs in vial transport medium  Result Value Ref Range Status   SARS Coronavirus 2 by RT PCR NEGATIVE NEGATIVE Final    Comment: (NOTE) SARS-CoV-2 target nucleic acids are NOT DETECTED.  The SARS-CoV-2 RNA is generally detectable in upper respiratory specimens during the acute phase of infection. The lowest concentration of SARS-CoV-2 viral copies this assay can detect is 138 copies/mL. A negative result does not preclude SARS-Cov-2 infection and should not be used as the sole basis for treatment or other patient management decisions. A negative result may occur with  improper specimen collection/handling, submission of specimen other than nasopharyngeal swab, presence of viral mutation(s) within the areas targeted by this assay, and inadequate number of viral copies(<138 copies/mL). A  negative result must be combined with clinical observations, patient history, and epidemiological information. The expected result is Negative.  Fact Sheet for Patients:  EntrepreneurPulse.com.au  Fact Sheet for Healthcare Providers:  IncredibleEmployment.be  This test is no t yet approved or cleared by the [place] FDA and  has been authorized for detection and/or diagnosis of SARS-CoV-2 by FDA under an Emergency Use Authorization (EUA). This EUA will remain  in effect (meaning this test can be used) for the duration of the COVID-19 declaration under Section 564(b)(1) of the Act, 21 U.S.C.section 360bbb-3(b)(1), unless the authorization is terminated  or revoked sooner.       Influenza A by PCR NEGATIVE NEGATIVE Final   Influenza B by PCR NEGATIVE NEGATIVE Final    Comment: (NOTE) The Xpert Xpress SARS-CoV-2/FLU/RSV plus assay is intended as an aid in the diagnosis of influenza from Nasopharyngeal swab specimens and should not be used as a sole basis for treatment. Nasal washings and aspirates are unacceptable for Xpert Xpress SARS-CoV-2/FLU/RSV testing.  Fact Sheet for Patients: EntrepreneurPulse.com.au  Fact Sheet for Healthcare Providers: IncredibleEmployment.be  This test is not yet approved or cleared by the [place] FDA and has been authorized for detection and/or diagnosis of SARS-CoV-2 by FDA under an Emergency Use Authorization (EUA). This EUA will remain in effect (meaning this test can be used) for the duration of the COVID-19 declaration under Section 564(b)(1) of the Act, 21 U.S.C. section 360bbb-3(b)(1), unless the authorization is terminated or revoked.  Performed at [facility], [address] [place],  [address]   SARS CORONAVIRUS 2 (TAT 6-24 HRS) Nasopharyngeal Nasopharyngeal Swab     Status: None   Collection Time: 04/10/21 12:28 AM   Specimen: Nasopharyngeal Swab   Result Value Ref Range Status   SARS Coronavirus 2 NEGATIVE NEGATIVE Final    Comment: (NOTE) SARS-CoV-2 target nucleic acids are NOT DETECTED.  The SARS-CoV-2 RNA is generally detectable in upper and lower respiratory specimens during the acute phase of infection. Negative results do not preclude SARS-CoV-2 infection, do not rule out co-infections with other pathogens, and should not be used as the sole basis for treatment or other patient management decisions. Negative results must be combined with clinical observations, patient history, and epidemiological information. The expected result is Negative.  Fact Sheet for Patients: SugarRoll.be  Fact Sheet for Healthcare Providers: https://www.woods-mathews.com/  This test is not yet approved  or cleared by the [place] and  has been authorized for detection and/or diagnosis of SARS-CoV-2 by FDA under an Emergency Use Authorization (EUA). This EUA will remain  in effect (meaning this test can be used) for the duration of the COVID-19 declaration under Se ction 564(b)(1) of the Act, 21 U.S.C. section 360bbb-3(b)(1), unless the authorization is terminated or revoked sooner.  Performed at [facility], [place] [address], [place], [place] [address]       Radiology Studies: No results found.  Scheduled Meds:  apixaban  5 mg Oral BID   atorvastatin  10 mg Oral QPM   budesonide  1 mg Nebulization BID   diltiazem  60 mg Oral Q8H   divalproex  1,000 mg Oral QHS   divalproex  500 mg Oral Daily   docusate sodium  100 mg Oral BID   gabapentin  300 mg Oral TID   hydrALAZINE  25 mg Oral TID   insulin aspart  0-20 Units Subcutaneous TID WC   insulin aspart  0-5 Units Subcutaneous QHS   insulin aspart  12 Units Subcutaneous TID WC   insulin glargine  40 Units Subcutaneous QHS   ipratropium-albuterol  3 mL Nebulization TID   levothyroxine  112 mcg Oral Q0600   methylPREDNISolone  (SOLU-MEDROL) injection  40 mg Intravenous BID   metoCLOPramide  5 mg Oral TID WC   montelukast  10 mg Oral Daily   pantoprazole  40 mg Oral BID AC   topiramate  100 mg Oral BID   torsemide  20 mg Oral Daily   umeclidinium bromide  1 puff Inhalation Daily   Continuous Infusions:     LOS: 2 days   Marylu Lund, MD [facility] Pager On Amion  If 7PM-7AM, please contact night-coverage 04/12/2021, 2:40 PM

## 2021-04-12 NOTE — Progress Notes (Signed)
RT placed pt on CPAP dream station for the night on 6cmH20 for pt comfort. Pt tolerating at this time. Pt has been having difficulty w/tolerating CPAP throughout the entire night and removes it for this reason. Pt respiratory status is stable w/no distress noted at this time. RT will continue to monitor.

## 2021-04-12 NOTE — Progress Notes (Signed)
RT placed patient on CPAP HS. 3L O2 bleed in needed. Patient tolerating well at this time. 

## 2021-04-13 DIAGNOSIS — I480 Paroxysmal atrial fibrillation: Secondary | ICD-10-CM | POA: Diagnosis not present

## 2021-04-13 DIAGNOSIS — I4891 Unspecified atrial fibrillation: Secondary | ICD-10-CM | POA: Diagnosis not present

## 2021-04-13 DIAGNOSIS — I10 Essential (primary) hypertension: Secondary | ICD-10-CM | POA: Diagnosis not present

## 2021-04-13 LAB — GLUCOSE, CAPILLARY
Glucose-Capillary: 373 mg/dL — ABNORMAL HIGH (ref 70–99)
Glucose-Capillary: 272 mg/dL — ABNORMAL HIGH (ref 70–99)
Glucose-Capillary: 180 mg/dL — ABNORMAL HIGH (ref 70–99)
Glucose-Capillary: 303 mg/dL — ABNORMAL HIGH (ref 70–99)

## 2021-04-13 MED ORDER — LACTULOSE 10 GM/15ML PO SOLN
20.0000 g | ORAL | Status: AC
  Administered 2021-04-13: 20 g via ORAL
  Filled 2021-04-13: qty 30

## 2021-04-13 MED ORDER — INSULIN GLARGINE 100 UNIT/ML ~~LOC~~ SOLN
45.0000 [IU] | Freq: Every day | SUBCUTANEOUS | Status: DC
  Administered 2021-04-13: 45 [IU] via SUBCUTANEOUS
  Filled 2021-04-13 (×3): qty 0.45

## 2021-04-13 MED ORDER — BISACODYL 10 MG RE SUPP
10.0000 mg | Freq: Once | RECTAL | Status: AC
  Administered 2021-04-13: 10 mg via RECTAL
  Filled 2021-04-13: qty 1

## 2021-04-13 NOTE — Discharge Instructions (Signed)

## 2021-04-13 NOTE — Progress Notes (Signed)
PROGRESS NOTE    Laura [name]  FGH:[id number] DOB: November 25, 1960 DOA: 04/09/2021 PCP: [name], MD    Brief Narrative:  60 year old female with past medical history of ASD status postrepair, paroxysmal atrial fibrillation status post DCCV, insulin-dependent diabetes mellitus type 2, PTSD, bipolar disorder, hypothyroidism, hyperlipidemia, obstructive sleep apnea on CPAP, hypogammaglobulinemia, hypertension and gastroesophageal reflux disease presents to [facility] emergency department via EMS due to shortness of breath and chest pain, admitted for asthma exacerbation and afib with RVR  Assessment & Plan:   Principal Problem:   Paroxysmal atrial fibrillation with rapid ventricular response ([place]) Active Problems:   Hypogammaglobulinemia ([place])   Type 2 diabetes mellitus with hyperglycemia, with long-term current use of insulin (HCC)   Hypothyroidism   Essential hypertension, benign   OSA on CPAP   Musculoskeletal chest pain   Severe persistent asthma with (acute) exacerbation   Mixed 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 ([place])   GERD without esophagitis   Asthma exacerbation  Principal Problem:   Paroxysmal atrial fibrillation with rapid ventricular response ([place])  Patient presenting with rapid atrial fibrillation and heart rates in excess of 140 bpm. Rapid atrial fibrillation is likely contributing to patient's ongoing respiratory distress HR now controlled with PO cardizem, cont current regimen   Active Problems:   Severe persistent asthma with (acute) exacerbation Patient presenting with concurrent persisting asthma exacerbation Continues with sob, coughing, decreased BS. Remains on 3LNC Would continue Solu-Medrol 40 mg IV every 12 for now. Ultimately plan to gradually wean steroids as tolerated Continue scheduled bronchodilator therapy with additional as needed nebulized treatments as necessary. Cont to wean o2 as tolerated   Elevated  troponin without myocardial infarction  Minimally elevated troponins of 19 and 20 Remains stable     Hypogammaglobulinemia ([place])  Continued outpatient management with intravenous immunoglobulin therapy every several weeks.     Type 2 diabetes mellitus with hyperglycemia, with long-term current use of insulin (HCC)  Accu-Cheks before every meal and nightly with sliding scale insulin Hyperglycemia likely will be exacerbated by ongoing steroid therapy. Hemoglobin A1c found to be 6.8% on 6/30. Glucose elevated secondary to steroids, will increase lantus to 45 units and continue meal coverage to 12 units Currently stable     Hypothyroidism  Continue home regimen of Synthroid TSH is stable at 2.048     Essential hypertension, benign  Continue home regimen of antihypertensive therapy     OSA on CPAP  CPAP nightly per home regimen     Musculoskeletal chest pain  Patient presenting with intermittent left-sided chest discomfort that is atypical and likely related to musculoskeletal strain Cont with analgesia as needed Continue to monitor on tele     Mixed 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 ([place])  Continue home regimen of statin therapy     GERD without esophagitis Continue home regimen of proton pump inhibitor   DVT prophylaxis: Eliquis Code Status: Full Family Communication: Pt in room, family not at bedside  Status is: Inpatient  Inpatient level of care appropriate due to severity of illness  Dispo: The patient is from: Home              Anticipated d/c is to: Home              Patient currently is not medically stable to d/c.   Difficult to place patient No    Consultants:    Procedures:    Antimicrobials: Anti-infectives (From admission, onward)  None       Subjective: Still coughing and wheezing  Objective: Vitals:   04/13/21 0500 04/13/21 0723 04/13/21 0727 04/13/21 0745  BP: (!) 142/67     Pulse: 76 66    Resp: 18 18     Temp: 97.9 F (36.6 C)     TempSrc: Oral     SpO2: 98% 98% 98% 98%  Weight:      Height:        Intake/Output Summary (Last 24 hours) at 04/13/2021 1309 Last data filed at 04/13/2021 0604 Gross per 24 hour  Intake 480 ml  Output 1100 ml  Net -620 ml    Filed Weights   04/10/21 0017 04/10/21 0225  Weight: 81.6 kg 85.3 kg    Examination: General exam: Conversant, in no acute distress Respiratory system: decreased BS throughout, no audible wheezing Cardiovascular system: regular rhythm, s1-s2 Gastrointestinal system: Nondistended, nontender, pos BS Central nervous system: No seizures, no tremors Extremities: No cyanosis, no joint deformities Skin: No rashes, no pallor Psychiatry: Affect normal // no auditory hallucinations   Data Reviewed: I have personally reviewed following labs and imaging studies  CBC: Recent Labs  Lab 04/07/21 1814 04/08/21 0536 04/09/21 2202 04/10/21 0500  WBC 4.1 3.2* 8.3 7.8  NEUTROABS  --   --  6.1 4.4  HGB 14.6 12.5 13.5 12.9  HCT 41.2 35.9* 38.2 36.1  MCV 92.0 94.0 92.9 92.3  PLT 193 162 233 681    Basic Metabolic Panel: Recent Labs  Lab 04/07/21 1814 04/08/21 0536 04/09/21 2202 04/10/21 0244 04/10/21 0500 04/12/21 0131  NA 135 130* 136  --  139 132*  K 4.2 3.8 3.9  --  3.6 4.2  CL 101 97* 103  --  105 98  CO2 22 22 25   --  24 23  GLUCOSE 455* 592* 370*  --  264* 313*  BUN 32* 30* 22*  --  20 30*  CREATININE 1.05* 0.82 0.95  --  0.87 1.00  CALCIUM 9.1 8.8* 8.9  --  9.1 8.8*  MG  --  2.1  --  1.9 1.8  --   PHOS  --  3.0  --   --   --   --     GFR: Estimated Creatinine Clearance: 64 mL/min (by C-G formula based on SCr of 1 mg/dL). Liver Function Tests: Recent Labs  Lab 04/08/21 0536 04/09/21 2202 04/10/21 0500 04/12/21 0131  AST 13* 19 17 14*  ALT 14 19 15 16   ALKPHOS 77 77 64 60  BILITOT 0.6 0.7 0.5 0.5  PROT 6.6 6.7 6.3* 5.9*  ALBUMIN 3.6 3.8 3.6 3.1*    No results for input(s): LIPASE, AMYLASE in the last  168 hours. Recent Labs  Lab 04/10/21 0243  AMMONIA 37*    Coagulation Profile: Recent Labs  Lab 04/08/21 0536  INR 1.1    Cardiac Enzymes: No results for input(s): CKTOTAL, CKMB, CKMBINDEX, TROPONINI in the last 168 hours. BNP (last 3 results) No results for input(s): PROBNP in the last 8760 hours. HbA1C: No results for input(s): HGBA1C in the last 72 hours.  CBG: Recent Labs  Lab 04/12/21 1155 04/12/21 1638 04/12/21 2153 04/13/21 0814 04/13/21 1127  GLUCAP 203* 266* 267* 373* 272*    Lipid Profile: No results for input(s): CHOL, HDL, LDLCALC, TRIG, CHOLHDL, LDLDIRECT in the last 72 hours. Thyroid Function Tests: No results for input(s): TSH, T4TOTAL, FREET4, T3FREE, THYROIDAB in the last 72 hours.  Anemia Panel: No results for  input(s): VITAMINB12, FOLATE, FERRITIN, TIBC, IRON, RETICCTPCT in the last 72 hours. Sepsis Labs: No results for input(s): PROCALCITON, LATICACIDVEN in the last 168 hours.  Recent Results (from the past 240 hour(s))  Resp Panel by RT-PCR (Flu A&B, Covid) Nasopharyngeal Swab     Status: None   Collection Time: 04/07/21  8:21 PM   Specimen: Nasopharyngeal Swab; Nasopharyngeal(NP) swabs in vial transport medium  Result Value Ref Range Status   SARS Coronavirus 2 by RT PCR NEGATIVE NEGATIVE Final    Comment: (NOTE) SARS-CoV-2 target nucleic acids are NOT DETECTED.  The SARS-CoV-2 RNA is generally detectable in upper respiratory specimens during the acute phase of infection. The lowest concentration of SARS-CoV-2 viral copies this assay can detect is 138 copies/mL. A negative result does not preclude SARS-Cov-2 infection and should not be used as the sole basis for treatment or other patient management decisions. A negative result may occur with  improper specimen collection/handling, submission of specimen other than nasopharyngeal swab, presence of viral mutation(s) within the areas targeted by this assay, and inadequate number of  viral copies(<138 copies/mL). A negative result must be combined with clinical observations, patient history, and epidemiological information. The expected result is Negative.  Fact Sheet for Patients:  EntrepreneurPulse.com.au  Fact Sheet for Healthcare Providers:  IncredibleEmployment.be  This test is no t yet approved or cleared by the [place] FDA and  has been authorized for detection and/or diagnosis of SARS-CoV-2 by FDA under an Emergency Use Authorization (EUA). This EUA will remain  in effect (meaning this test can be used) for the duration of the COVID-19 declaration under Section 564(b)(1) of the Act, 21 U.S.C.section 360bbb-3(b)(1), unless the authorization is terminated  or revoked sooner.       Influenza A by PCR NEGATIVE NEGATIVE Final   Influenza B by PCR NEGATIVE NEGATIVE Final    Comment: (NOTE) The Xpert Xpress SARS-CoV-2/FLU/RSV plus assay is intended as an aid in the diagnosis of influenza from Nasopharyngeal swab specimens and should not be used as a sole basis for treatment. Nasal washings and aspirates are unacceptable for Xpert Xpress SARS-CoV-2/FLU/RSV testing.  Fact Sheet for Patients: EntrepreneurPulse.com.au  Fact Sheet for Healthcare Providers: IncredibleEmployment.be  This test is not yet approved or cleared by the [place] FDA and has been authorized for detection and/or diagnosis of SARS-CoV-2 by FDA under an Emergency Use Authorization (EUA). This EUA will remain in effect (meaning this test can be used) for the duration of the COVID-19 declaration under Section 564(b)(1) of the Act, 21 U.S.C. section 360bbb-3(b)(1), unless the authorization is terminated or revoked.  Performed at [facility], [address] [place], [place] [address]   SARS CORONAVIRUS 2 (TAT 6-24 HRS) Nasopharyngeal Nasopharyngeal Swab     Status: None   Collection Time: 04/10/21 12:28 AM    Specimen: Nasopharyngeal Swab  Result Value Ref Range Status   SARS Coronavirus 2 NEGATIVE NEGATIVE Final    Comment: (NOTE) SARS-CoV-2 target nucleic acids are NOT DETECTED.  The SARS-CoV-2 RNA is generally detectable in upper and lower respiratory specimens during the acute phase of infection. Negative results do not preclude SARS-CoV-2 infection, do not rule out co-infections with other pathogens, and should not be used as the sole basis for treatment or other patient management decisions. Negative results must be combined with clinical observations, patient history, and epidemiological information. The expected result is Negative.  Fact Sheet for Patients: SugarRoll.be  Fact Sheet for Healthcare Providers: https://www.woods-mathews.com/  This test is not yet approved  or cleared by the [place] and  has been authorized for detection and/or diagnosis of SARS-CoV-2 by FDA under an Emergency Use Authorization (EUA). This EUA will remain  in effect (meaning this test can be used) for the duration of the COVID-19 declaration under Se ction 564(b)(1) of the Act, 21 U.S.C. section 360bbb-3(b)(1), unless the authorization is terminated or revoked sooner.  Performed at [facility], [place] [address], [place], [place] [address]       Radiology Studies: No results found.  Scheduled Meds:  apixaban  5 mg Oral BID   atorvastatin  10 mg Oral QPM   budesonide  1 mg Nebulization BID   diltiazem  60 mg Oral Q8H   divalproex  1,000 mg Oral QHS   divalproex  500 mg Oral Daily   docusate sodium  100 mg Oral BID   gabapentin  300 mg Oral TID   hydrALAZINE  25 mg Oral TID   insulin aspart  0-20 Units Subcutaneous TID WC   insulin aspart  0-5 Units Subcutaneous QHS   insulin aspart  12 Units Subcutaneous TID WC   insulin glargine  40 Units Subcutaneous QHS   ipratropium-albuterol  3 mL Nebulization TID   levothyroxine  112 mcg  Oral Q0600   methylPREDNISolone (SOLU-MEDROL) injection  40 mg Intravenous BID   metoCLOPramide  5 mg Oral TID WC   montelukast  10 mg Oral Daily   pantoprazole  40 mg Oral BID AC   topiramate  100 mg Oral BID   torsemide  20 mg Oral Daily   umeclidinium bromide  1 puff Inhalation Daily   Continuous Infusions:     LOS: 3 days   Marylu Lund, MD [facility] Pager On Amion  If 7PM-7AM, please contact night-coverage 04/13/2021, 1:09 PM

## 2021-04-14 DIAGNOSIS — I480 Paroxysmal atrial fibrillation: Secondary | ICD-10-CM | POA: Diagnosis not present

## 2021-04-14 LAB — GLUCOSE, CAPILLARY
Glucose-Capillary: 232 mg/dL — ABNORMAL HIGH (ref 70–99)
Glucose-Capillary: 351 mg/dL — ABNORMAL HIGH (ref 70–99)
Glucose-Capillary: 257 mg/dL — ABNORMAL HIGH (ref 70–99)
Glucose-Capillary: 88 mg/dL (ref 70–99)

## 2021-04-14 MED ORDER — INSULIN GLARGINE 100 UNIT/ML ~~LOC~~ SOLN
55.0000 [IU] | Freq: Every day | SUBCUTANEOUS | Status: DC
  Administered 2021-04-14 – 2021-04-16 (×3): 55 [IU] via SUBCUTANEOUS
  Filled 2021-04-14 (×4): qty 0.55

## 2021-04-14 MED ORDER — INSULIN ASPART 100 UNIT/ML IJ SOLN
15.0000 [IU] | Freq: Three times a day (TID) | INTRAMUSCULAR | Status: DC
  Administered 2021-04-14 – 2021-04-15 (×5): 15 [IU] via SUBCUTANEOUS

## 2021-04-14 MED ORDER — DILTIAZEM HCL ER COATED BEADS 180 MG PO CP24
360.0000 mg | ORAL_CAPSULE | Freq: Every day | ORAL | Status: DC
  Administered 2021-04-15 – 2021-04-17 (×3): 360 mg via ORAL
  Filled 2021-04-14 (×3): qty 2

## 2021-04-14 MED ORDER — METHYLPREDNISOLONE SODIUM SUCC 40 MG IJ SOLR
40.0000 mg | INTRAMUSCULAR | Status: DC
  Administered 2021-04-15: 40 mg via INTRAVENOUS

## 2021-04-14 NOTE — Progress Notes (Signed)
PROGRESS NOTE    Laura [name]  OBS:[id number] DOB: Aug 08, 1961 DOA: 04/09/2021 PCP: [name], MD    Brief Narrative:  Laura [name] is a 60 year old female with past medical history significant for ASD s/p repair, paroxysmal atrial fibrillation s/p DCCV, insulin-dependent diabetes mellitus, PTSD, bipolar disorder, hypothyroidism, hyperlipidemia, OSA on CPAP, essential hypertension, GERD, hypogammaglobulinemia, GERD who presented to [facility] on 7/1 via EMS for shortness of breath and chest pain.  Patient reports her shortness of breath worse with minimal exertion and improved with rest.  Also associated with a persistent cough that is productive with green sputum with associated intermittent subjective fever.  Chest pain is sharp in quality, localized to the left anterior chest, nonradiating and worse with deep inspiration or cough.  On EMS arrival, patient was noted in A. fib with RVR.  Patient was given sublingual nitroglycerin with improvement of chest pain by EMS.  Patient recently hospitalized to [facility] from 6/29 until 6/30 for asthma exacerbation and discharged on a course of oral prednisone.  In the ED, patient was noted to be in A. fib with RVR and started on a Cardizem drip.  Patient was also started on nebulized bronchodilator therapy and given IV Solu-Medrol.  Hospitalist service consulted for further evaluation management of A. fib with RVR and acute asthma exacerbation.   Assessment & Plan:   Principal Problem:   Paroxysmal atrial fibrillation with rapid ventricular response (HCC) Active Problems:   Hypogammaglobulinemia ([place])   Type 2 diabetes mellitus with hyperglycemia, with long-term current use of insulin (HCC)   Hypothyroidism   Essential hypertension, benign   OSA on CPAP   Musculoskeletal chest pain   Severe persistent asthma with (acute) exacerbation   Mixed 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 (HCC)   GERD without  esophagitis   Asthma exacerbation   Paroxysmal atrial fibrillation with RVR Patient presenting with A. fib with RVR, heart rates greater than 140 on ED presentation.  Likely contributing to patient's ongoing respiratory distress.  Initially started on a Cardizem drip now titrated off.  Patient on Cardizem CD 360 mg daily at home. --Cardizem 60 mg p.o. q8h; transition back to home Cardizem CD 360mg  PO daily on 7/7 --Apixaban 5 mg p.o. twice daily --Continue monitor on telemetry  Severe persistent asthma with acute exacerbation Patient presenting with significant dyspnea with associated coughing. On Fasenra 30mg  [place] q8 wks outpatient.  Chest x-ray with no acute cardiopulmonary disease process. --Pulmicort neb twice daily --Solu-Medrol 40 mg IV q12h; decrease to 40mg  IV on 7/7 --Singulair 10 mg p.o. daily --Incruse Ellipta 1 puff daily --DuoNebs TID and prn --Tessalon/guaifenesin-codeine prn cough --continue supplemental oxygen, titrate to maintain SpO2 >92% --Incentive spirometry/flutter valve --Ambulatory O2 screening today  Elevated troponin High-sensitivity troponin slightly elevated at 19, 20.  EKG without dynamic changes.  Etiology likely secondary to demand ischemia from A. fib with RVR as above. --Continue monitor on telemetry  Hypothyroidism TSH 2.048, within normal limits. --Levothyroxine 112 mcg p.o. daily  OSA: Continue nocturnal CPAP  Musculoskeletal chest pain Patient reported left-sided intermittent chest discomfort, sharp in nature without radiation.  Etiology likely secondary to musculoskeletal strain from persistent cough.  Essential hypertension --Hydralazine 25 mg p.o. 3 times daily --Diltiazem 60 mg p.o. every 8 hours --Torsemide 20 mg p.o. daily  Hyperlipidemia: Atorvastatin 10 mg p.o. daily  Type 2 diabetes mellitus Hemoglobin A1c 6.8 on 6/30, well controlled.  Home regimen includes Lantus 40 units subcutaneously nightly --Lantus 55 units immensely  nightly --  NovoLog 15 units TIDAC -- Resistant ISS for further coverage --CBGs qAC/HS  GERD: Protonix 40 mg p.o. twice daily  PTSD Bipolar disorder --Depakote 5 mg p.o. daily --Depakote 1000 mg nightly --Topamax 100 mg p.o. twice daily --Outpatient follow-up with psychiatry   DVT prophylaxis:  apixaban (ELIQUIS) tablet 5 mg    Code Status: Full Code Family Communication: No family present at bedside this morning  Disposition Plan:  Level of care: Telemetry Medical Status is: Inpatient  Remains inpatient appropriate because:Ongoing diagnostic testing needed not appropriate for outpatient work up, Unsafe d/c plan, IV treatments appropriate due to intensity of illness or inability to take PO, and Inpatient level of care appropriate due to severity of illness  Dispo: The patient is from: Home              Anticipated d/c is to: Home              Patient currently is not medically stable to d/c.   Difficult to place patient No   Consultants:  none  Procedures:  none  Antimicrobials:  None   Subjective: Patient seen examined at bedside, resting comfortably.  Continues with persistent cough with less sputum production.  Maintains on 3 L nasal cannula, not oxygen dependent at home.  Continues with wheezing and significant shortness of breath.  No other complaints or concerns at this time.  Denies headache, no visual changes, no current chest pain, no palpitations, no abdominal pain, no fever/chills/night sweats, no nausea/vomiting/diarrhea, no weakness, no fatigue, no paresthesias.  No acute events overnight per nursing staff.  Objective: Vitals:   04/14/21 0842 04/14/21 0910 04/14/21 1253 04/14/21 1407  BP:  (!) 164/94 (!) 157/71   Pulse:  78 90   Resp:  17    Temp:  (!) 97.4 F (36.3 C)    TempSrc:  Oral    SpO2: 99% 98% 94% 93%  Weight:      Height:        Intake/Output Summary (Last 24 hours) at 04/14/2021 1416 Last data filed at 04/14/2021 1300 Gross per 24 hour   Intake 360 ml  Output 2300 ml  Net -1940 ml   Filed Weights   04/10/21 0017 04/10/21 0225  Weight: 81.6 kg 85.3 kg    Examination:  General exam: Appears calm and comfortable  Respiratory system: Decreased breath sounds bilateral bases with significant generalized expiratory wheezing bilaterally, no crackles, normal [place]paire effort and without accessory muscle use, on 3 L nasal cannula with SPO2 98% at rest. Cardiovascular system: S1 & S2 heard, irregularly irregular rhythm, normal rate. No JVD, murmurs, rubs, gallops or clicks. No pedal edema. Gastrointestinal system: Abdomen is nondistended, soft and nontender. No organomegaly or masses felt. Normal bowel sounds heard. Central nervous system: Alert and oriented. No focal neurological deficits. Extremities: Symmetric 5 x 5 power. Skin: No rashes, lesions or ulcers Psychiatry: Judgement and insight appear normal. Mood & affect appropriate.     Data Reviewed: I have personally reviewed following labs and imaging studies  CBC: Recent Labs  Lab 04/07/21 1814 04/08/21 0536 04/09/21 2202 04/10/21 0500  WBC 4.1 3.2* 8.3 7.8  NEUTROABS  --   --  6.1 4.4  HGB 14.6 12.5 13.5 12.9  HCT 41.2 35.9* 38.2 36.1  MCV 92.0 94.0 92.9 92.3  PLT 193 162 233 426   Basic Metabolic Panel: Recent Labs  Lab 04/07/21 1814 04/08/21 0536 04/09/21 2202 04/10/21 0244 04/10/21 0500 04/12/21 0131  NA 135 130* 136  --  139 132*  K 4.2 3.8 3.9  --  3.6 4.2  CL 101 97* 103  --  105 98  CO2 22 22 25   --  24 23  GLUCOSE 455* 592* 370*  --  264* 313*  BUN 32* 30* 22*  --  20 30*  CREATININE 1.05* 0.82 0.95  --  0.87 1.00  CALCIUM 9.1 8.8* 8.9  --  9.1 8.8*  MG  --  2.1  --  1.9 1.8  --   PHOS  --  3.0  --   --   --   --    GFR: Estimated Creatinine Clearance: 64 mL/min (by C-G formula based on SCr of 1 mg/dL). Liver Function Tests: Recent Labs  Lab 04/08/21 0536 04/09/21 2202 04/10/21 0500 04/12/21 0131  AST 13* 19 17 14*  ALT 14 19  15 16   ALKPHOS 77 77 64 60  BILITOT 0.6 0.7 0.5 0.5  PROT 6.6 6.7 6.3* 5.9*  ALBUMIN 3.6 3.8 3.6 3.1*   No results for input(s): LIPASE, AMYLASE in the last 168 hours. Recent Labs  Lab 04/10/21 0243  AMMONIA 37*   Coagulation Profile: Recent Labs  Lab 04/08/21 0536  INR 1.1   Cardiac Enzymes: No results for input(s): CKTOTAL, CKMB, CKMBINDEX, TROPONINI in the last 168 hours. BNP (last 3 results) No results for input(s): PROBNP in the last 8760 hours. HbA1C: No results for input(s): HGBA1C in the last 72 hours. CBG: Recent Labs  Lab 04/13/21 1127 04/13/21 1747 04/13/21 2129 04/14/21 0820 04/14/21 1156  GLUCAP 272* 180* 303* 232* 351*   Lipid Profile: No results for input(s): CHOL, HDL, LDLCALC, TRIG, CHOLHDL, LDLDIRECT in the last 72 hours. Thyroid Function Tests: No results for input(s): TSH, T4TOTAL, FREET4, T3FREE, THYROIDAB in the last 72 hours. Anemia Panel: No results for input(s): VITAMINB12, FOLATE, FERRITIN, TIBC, IRON, RETICCTPCT in the last 72 hours. Sepsis Labs: No results for input(s): PROCALCITON, LATICACIDVEN in the last 168 hours.  Recent Results (from the past 240 hour(s))  Resp Panel by RT-PCR (Flu A&B, Covid) Nasopharyngeal Swab     Status: None   Collection Time: 04/07/21  8:21 PM   Specimen: Nasopharyngeal Swab; Nasopharyngeal(NP) swabs in vial transport medium  Result Value Ref Range Status   SARS Coronavirus 2 by RT PCR NEGATIVE NEGATIVE Final    Comment: (NOTE) SARS-CoV-2 target nucleic acids are NOT DETECTED.  The SARS-CoV-2 RNA is generally detectable in upper respiratory specimens during the acute phase of infection. The lowest concentration of SARS-CoV-2 viral copies this assay can detect is 138 copies/mL. A negative result does not preclude SARS-Cov-2 infection and should not be used as the sole basis for treatment or other patient management decisions. A negative result may occur with  improper specimen collection/handling,  submission of specimen other than nasopharyngeal swab, presence of viral mutation(s) within the areas targeted by this assay, and inadequate number of viral copies(<138 copies/mL). A negative result must be combined with clinical observations, patient history, and epidemiological information. The expected result is Negative.  Fact Sheet for Patients:  EntrepreneurPulse.com.au  Fact Sheet for Healthcare Providers:  IncredibleEmployment.be  This test is no t yet approved or cleared by the [place] FDA and  has been authorized for detection and/or diagnosis of SARS-CoV-2 by FDA under an Emergency Use Authorization (EUA). This EUA will remain  in effect (meaning this test can be used) for the duration of the COVID-19 declaration under Section 564(b)(1) of the Act, 21 U.S.C.section 360bbb-3(b)(1), unless the  authorization is terminated  or revoked sooner.       Influenza A by PCR NEGATIVE NEGATIVE Final   Influenza B by PCR NEGATIVE NEGATIVE Final    Comment: (NOTE) The Xpert Xpress SARS-CoV-2/FLU/RSV plus assay is intended as an aid in the diagnosis of influenza from Nasopharyngeal swab specimens and should not be used as a sole basis for treatment. Nasal washings and aspirates are unacceptable for Xpert Xpress SARS-CoV-2/FLU/RSV testing.  Fact Sheet for Patients: EntrepreneurPulse.com.au  Fact Sheet for Healthcare Providers: IncredibleEmployment.be  This test is not yet approved or cleared by the [place] FDA and has been authorized for detection and/or diagnosis of SARS-CoV-2 by FDA under an Emergency Use Authorization (EUA). This EUA will remain in effect (meaning this test can be used) for the duration of the COVID-19 declaration under Section 564(b)(1) of the Act, 21 U.S.C. section 360bbb-3(b)(1), unless the authorization is terminated or revoked.  Performed at [facility], [address], [place], [place] [address]   SARS CORONAVIRUS 2 (TAT 6-24 HRS) Nasopharyngeal Nasopharyngeal Swab     Status: None   Collection Time: 04/10/21 12:28 AM   Specimen: Nasopharyngeal Swab  Result Value Ref Range Status   SARS Coronavirus 2 NEGATIVE NEGATIVE Final    Comment: (NOTE) SARS-CoV-2 target nucleic acids are NOT DETECTED.  The SARS-CoV-2 RNA is generally detectable in upper and lower respiratory specimens during the acute phase of infection. Negative results do not preclude SARS-CoV-2 infection, do not rule out co-infections with other pathogens, and should not be used as the sole basis for treatment or other patient management decisions. Negative results must be combined with clinical observations, patient history, and epidemiological information. The expected result is Negative.  Fact Sheet for Patients: SugarRoll.be  Fact Sheet for Healthcare Providers: https://www.woods-mathews.com/  This test is not yet approved or cleared by the [place] FDA and  has been authorized for detection and/or diagnosis of SARS-CoV-2 by FDA under an Emergency Use Authorization (EUA). This EUA will remain  in effect (meaning this test can be used) for the duration of the COVID-19 declaration under Se ction 564(b)(1) of the Act, 21 U.S.C. section 360bbb-3(b)(1), unless the authorization is terminated or revoked sooner.  Performed at [facility], [place] [address], [place], [place] [address]          Radiology Studies: No results found.      Scheduled Meds:  apixaban  5 mg Oral BID   atorvastatin  10 mg Oral QPM   budesonide  1 mg Nebulization BID   diltiazem  60 mg Oral Q8H   divalproex  1,000 mg Oral QHS   divalproex  500 mg Oral Daily   docusate sodium  100 mg Oral BID   gabapentin  300 mg Oral TID   hydrALAZINE  25 mg Oral TID   insulin aspart  0-20 Units Subcutaneous TID WC   insulin aspart  0-5 Units Subcutaneous QHS    insulin aspart  15 Units Subcutaneous TID WC   insulin glargine  55 Units Subcutaneous QHS   ipratropium-albuterol  3 mL Nebulization TID   levothyroxine  112 mcg Oral Q0600   methylPREDNISolone (SOLU-MEDROL) injection  40 mg Intravenous BID   [START ON 04/15/2021] methylPREDNISolone (SOLU-MEDROL) injection  40 mg Intravenous Q24H   metoCLOPramide  5 mg Oral TID WC   montelukast  10 mg Oral Daily   pantoprazole  40 mg Oral BID AC   topiramate  100 mg Oral BID   torsemide  20 mg Oral Daily   umeclidinium bromide  1 puff Inhalation Daily   Continuous Infusions:   LOS: 4 days    Time spent: 41 minutes spent on chart review, discussion with nursing staff, consultants, updating family and interview/physical exam; more than 50% of that time was spent in counseling and/or coordination of care.    Srija Southard J [place] DO [facility] Available via Epic secure chat 7am-7pm After these hours, please refer to coverage provider listed on [email or web address] 04/14/2021, 2:16 PM

## 2021-04-14 NOTE — Care Management (Addendum)
1542 04-14-21 Case Manager spoke with the patient and she lives with her sister in [place] in a double wide mobile home. Patient uses Holiday representative in [facility]. Patient does not have oxygen in the home. Case Manager will follow for oxygen needs. Physical Therapy has worked with the patient and recommendations are for home health physical therapy. Case Manager discussed with the patient regarding home health therapy. Patient states she will need to speak with her sister regarding having an agency in the home. Case Manager will speak with patient in the morning regarding home health.

## 2021-04-14 NOTE — Telephone Encounter (Signed)
Patient had called back and advised in hospital. Will try to reach her again when she is discharged

## 2021-04-14 NOTE — Telephone Encounter (Signed)
Thank you for taking such good care of her patients!  I appreciate the update.  Salvatore Marvel, MD [facility] [place] [facility] [place]

## 2021-04-14 NOTE — Telephone Encounter (Signed)
Can we call her and see how she is doing?  Salvatore Marvel, MD [facility] [place] [facility] [place]

## 2021-04-14 NOTE — Telephone Encounter (Signed)
Patient called to let Dr Ernst Bowler know she is currently in [facility]. She has been there since 04/09/2021.

## 2021-04-14 NOTE — Evaluation (Signed)
Physical Therapy Evaluation Patient Details Name: Laura [name] MRN: [id number] DOB: 02-Feb-1961 Today's Date: 04/14/2021   History of Present Illness  Pt adm 7/1 with SOB and chest pain. Pt found to have afib with RVR and asthma exacerbation. PMH - DM, HTN, PTSD, bipolar, OSA, chf, TIA, neuropathy, arthritis  Clinical Impression  Pt presents to PT with decr mobility and activity tolerance. Needed rollator for amb in hallway. Expect pt will make steady progress toward return home with sister.     Follow Up Recommendations Home health PT    Equipment Recommendations  Other (comment) (rollator)    Recommendations for Other Services       Precautions / Restrictions Precautions Precautions: Fall      Mobility  Bed Mobility Overal bed mobility: Independent                  Transfers Overall transfer level: Needs assistance Equipment used: None Transfers: Sit to/from Stand Sit to Stand: Min guard         General transfer comment: Assist for safety and lines  Ambulation/Gait Ambulation/Gait assistance: Min guard;Min assist Gait Distance (Feet): 110 Feet Assistive device: 4-wheeled walker;1 person hand held assist Gait Pattern/deviations: Step-through pattern;Decreased stride length Gait velocity: decr Gait velocity interpretation: 1.31 - 2.62 ft/sec, indicative of limited community ambulator General Gait Details: Assist for balance and safety. Min assist without rollator and min guard when using rollator  Stairs            Wheelchair Mobility    Modified Rankin (Stroke Patients Only)       Balance Overall balance assessment: Needs assistance Sitting-balance support: No upper extremity supported;Feet supported Sitting balance-Leahy Scale: Normal     Standing balance support: No upper extremity supported;During functional activity Standing balance-Leahy Scale: Fair                               Pertinent Vitals/Pain Pain Assessment:  No/denies pain    Home Living Family/patient expects to be discharged to:: Private residence Living Arrangements: Other (Comment) (sister) Available Help at Discharge: Family Type of Home: House Home Access: Stairs to enter   Technical brewer of Steps: 3-4 Home Layout: One level Home Equipment: None      Prior Function Level of Independence: Independent               Hand Dominance        Extremity/Trunk Assessment   Upper Extremity Assessment Upper Extremity Assessment: Overall WFL for tasks assessed    Lower Extremity Assessment Lower Extremity Assessment: Generalized weakness       Communication   Communication: No difficulties  Cognition Arousal/Alertness: Awake/alert Behavior During Therapy: WFL for tasks assessed/performed Overall Cognitive Status: Within Functional Limits for tasks assessed                                        General Comments General comments (skin integrity, edema, etc.): Pt on 4L of O2 throughout with SpO2 >93%. Dyspnea 3/4. HR 105    Exercises     Assessment/Plan    PT Assessment Patient needs continued PT services  PT Problem List Decreased strength;Decreased activity tolerance;Decreased balance;Decreased mobility;Cardiopulmonary status limiting activity       PT Treatment Interventions DME instruction;Gait training;Stair training;Functional mobility training;Therapeutic activities;Therapeutic exercise;Balance training;Patient/family education    PT Goals (Current goals can  be found in the Care Plan section)  Acute Rehab PT Goals Patient Stated Goal: return home PT Goal Formulation: With patient Time For Goal Achievement: 04/28/21 Potential to Achieve Goals: Good    Frequency Min 3X/week   Barriers to discharge        Co-evaluation               AM-PAC PT "6 Clicks" Mobility  Outcome Measure Help needed turning from your back to your side while in a flat bed without using  bedrails?: None Help needed moving from lying on your back to sitting on the side of a flat bed without using bedrails?: None Help needed moving to and from a bed to a chair (including a wheelchair)?: A Little Help needed standing up from a chair using your arms (e.g., wheelchair or bedside chair)?: A Little Help needed to walk in hospital room?: A Little Help needed climbing 3-5 steps with a railing? : A Little 6 Click Score: 20    End of Session Equipment Utilized During Treatment: Gait belt;Oxygen Activity Tolerance: Patient limited by fatigue Patient left: in bed;with call bell/phone within reach   PT Visit Diagnosis: Unsteadiness on feet (R26.81);Other abnormalities of gait and mobility (R26.89);Muscle weakness (generalized) (M62.81)    Time: 3762-8315 PT Time Calculation (min) (ACUTE ONLY): 21 min   Charges:   PT Evaluation $PT Eval Moderate Complexity: [place] Pager [phone] Office [place] 04/14/2021, 2:22 PM

## 2021-04-15 DIAGNOSIS — I480 Paroxysmal atrial fibrillation: Secondary | ICD-10-CM | POA: Diagnosis not present

## 2021-04-15 LAB — GLUCOSE, CAPILLARY
Glucose-Capillary: 380 mg/dL — ABNORMAL HIGH (ref 70–99)
Glucose-Capillary: 246 mg/dL — ABNORMAL HIGH (ref 70–99)
Glucose-Capillary: 342 mg/dL — ABNORMAL HIGH (ref 70–99)
Glucose-Capillary: 230 mg/dL — ABNORMAL HIGH (ref 70–99)
Glucose-Capillary: 221 mg/dL — ABNORMAL HIGH (ref 70–99)

## 2021-04-15 MED ORDER — ARFORMOTEROL TARTRATE 15 MCG/2ML IN NEBU
15.0000 ug | INHALATION_SOLUTION | Freq: Two times a day (BID) | RESPIRATORY_TRACT | Status: DC
  Administered 2021-04-15 – 2021-04-17 (×5): 15 ug via RESPIRATORY_TRACT
  Filled 2021-04-15 (×5): qty 2

## 2021-04-15 MED ORDER — INSULIN ASPART 100 UNIT/ML IJ SOLN
18.0000 [IU] | Freq: Three times a day (TID) | INTRAMUSCULAR | Status: DC
  Administered 2021-04-15 – 2021-04-17 (×4): 18 [IU] via SUBCUTANEOUS

## 2021-04-15 MED ORDER — PREDNISONE 20 MG PO TABS
40.0000 mg | ORAL_TABLET | Freq: Every day | ORAL | Status: DC
  Administered 2021-04-16: 40 mg via ORAL
  Filled 2021-04-15: qty 2

## 2021-04-15 NOTE — Progress Notes (Signed)
PROGRESS NOTE    Laura [name]  QZR:[id number] DOB: 07/14/1961 DOA: 04/09/2021 PCP: [name], MD    Brief Narrative:  Margaux Engen is a 60 year old female with past medical history significant for ASD s/p repair, paroxysmal atrial fibrillation s/p DCCV, insulin-dependent diabetes mellitus, PTSD, bipolar disorder, hypothyroidism, hyperlipidemia, OSA on CPAP, essential hypertension, GERD, hypogammaglobulinemia, GERD who presented to [facility] on 7/1 via EMS for shortness of breath and chest pain.  Patient reports her shortness of breath worse with minimal exertion and improved with rest.  Also associated with a persistent cough that is productive with green sputum with associated intermittent subjective fever.  Chest pain is sharp in quality, localized to the left anterior chest, nonradiating and worse with deep inspiration or cough.  On EMS arrival, patient was noted in A. fib with RVR.  Patient was given sublingual nitroglycerin with improvement of chest pain by EMS.  Patient recently hospitalized to [facility] from 6/29 until 6/30 for asthma exacerbation and discharged on a course of oral prednisone.  In the ED, patient was noted to be in A. fib with RVR and started on a Cardizem drip.  Patient was also started on nebulized bronchodilator therapy and given IV Solu-Medrol.  Hospitalist service consulted for further evaluation management of A. fib with RVR and acute asthma exacerbation.   Assessment & Plan:   Principal Problem:   Paroxysmal atrial fibrillation with rapid ventricular response (HCC) Active Problems:   Hypogammaglobulinemia ([place])   Type 2 diabetes mellitus with hyperglycemia, with long-term current use of insulin (HCC)   Hypothyroidism   Essential hypertension, benign   OSA on CPAP   Musculoskeletal chest pain   Severe persistent asthma with (acute) exacerbation   Mixed 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 (HCC)   GERD without  esophagitis   Asthma exacerbation   Paroxysmal atrial fibrillation with RVR Patient presenting with A. fib with RVR, heart rates greater than 140 on ED presentation.  Likely contributing to patient's ongoing respiratory distress.  Initially started on a Cardizem drip now titrated off.  --Cardizem CD 360mg  PO daily  --Apixaban 5 mg p.o. twice daily --Continue monitor on telemetry  Severe persistent asthma with acute exacerbation Patient presenting with significant dyspnea with associated coughing. On Fasenra 30mg  [facility] q8 wks outpatient.  Chest x-ray with no acute cardiopulmonary disease process. --Pulmicort neb BID --Brovana neb BID --Solu-Medrol 40 mg IV q24h; transition to prednisone 40mg  PO daily on 7/8 --Singulair 10 mg p.o. daily --Incruse Ellipta 1 puff daily --DuoNebs prn --Tessalon/guaifenesin-codeine prn cough --continue supplemental oxygen, titrate to maintain SpO2 >92% --Incentive spirometry/flutter valve --Ambulatory O2 screening today  Elevated troponin High-sensitivity troponin slightly elevated at 19, 20.  EKG without dynamic changes.  Etiology likely secondary to demand ischemia from A. fib with RVR as above. --Continue monitor on telemetry  Hypothyroidism TSH 2.048, within normal limits. --Levothyroxine 112 mcg p.o. daily  OSA: Continue nocturnal CPAP  Musculoskeletal chest pain Patient reported left-sided intermittent chest discomfort, sharp in nature without radiation.  Etiology likely secondary to musculoskeletal strain from persistent cough.  Essential hypertension --Hydralazine 25 mg p.o. 3 times daily --Cardizem CD 360 mg p.o. daily --Torsemide 20 mg p.o. daily  Hyperlipidemia: Atorvastatin 10 mg p.o. daily  Type 2 diabetes mellitus Hemoglobin A1c 6.8 on 6/30, well controlled.  Home regimen includes Lantus 40 units subcutaneously nightly --Lantus 55 units immensely nightly --NovoLog 18 units TIDAC -- Resistant ISS for further coverage --CBGs  qAC/HS  GERD: Protonix 40 mg p.o.  twice daily  PTSD Bipolar disorder --Depakote 5 mg p.o. daily --Depakote 1000 mg nightly --Topamax 100 mg p.o. twice daily --Outpatient follow-up with psychiatry   DVT prophylaxis:  apixaban (ELIQUIS) tablet 5 mg    Code Status: Full Code Family Communication: No family present at bedside this morning  Disposition Plan:  Level of care: Telemetry Medical Status is: Inpatient  Remains inpatient appropriate because:Ongoing diagnostic testing needed not appropriate for outpatient work up, Unsafe d/c plan, IV treatments appropriate due to intensity of illness or inability to take PO, and Inpatient level of care appropriate due to severity of illness  Dispo: The patient is from: Home              Anticipated d/c is to: Home              Patient currently is not medically stable to d/c.   Difficult to place patient No   Consultants:  none  Procedures:  none  Antimicrobials:  None   Subjective: Patient seen examined at bedside, resting comfortably.  Continues to complain of persistent nonproductive cough, abdominal discomfort but relieved with bowel movement this morning.  Overall feels ill.  Less wheezing this morning.  No other questions or concerns at this time. Denies headache, no visual changes, no current chest pain, no palpitations, no fever/chills/night sweats, no nausea/vomiting/diarrhea, no weakness, no fatigue, no paresthesias.  No acute events overnight per nursing staff.  Objective: Vitals:   04/15/21 0656 04/15/21 0738 04/15/21 0931 04/15/21 1131  BP: (!) 161/72 (!) 161/72 (!) 163/72   Pulse: 71 83 (!) 111 65  Resp: 18 20 20 18   Temp: (!) 97.4 F (36.3 C)  98.7 F (37.1 C)   TempSrc: Oral  Oral   SpO2: 95% 95% 96% 93%  Weight:      Height:        Intake/Output Summary (Last 24 hours) at 04/15/2021 1216 Last data filed at 04/14/2021 1500 Gross per 24 hour  Intake 240 ml  Output 1200 ml  Net -960 ml   Filed Weights    04/10/21 0017 04/10/21 0225  Weight: 81.6 kg 85.3 kg    Examination:  General exam: Appears calm and comfortable  Respiratory system: Decreased breath sounds bilateral bases with mild mid-late expiratory wheezing bilaterally, no crackles, normal respiratory effort and without accessory muscle use, on 3 L nasal cannula with SPO2 94% at rest. Cardiovascular system: S1 & S2 heard, irregularly irregular rhythm, normal rate. No JVD, murmurs, rubs, gallops or clicks. No pedal edema. Gastrointestinal system: Abdomen is nondistended, soft and nontender. No organomegaly or masses felt. Normal bowel sounds heard. Central nervous system: Alert and oriented. No focal neurological deficits. Extremities: Symmetric 5 x 5 power. Skin: No rashes, lesions or ulcers Psychiatry: Judgement and insight appear normal. Mood & affect appropriate.     Data Reviewed: I have personally reviewed following labs and imaging studies  CBC: Recent Labs  Lab 04/09/21 2202 04/10/21 0500  WBC 8.3 7.8  NEUTROABS 6.1 4.4  HGB 13.5 12.9  HCT 38.2 36.1  MCV 92.9 92.3  PLT 233 333   Basic Metabolic Panel: Recent Labs  Lab 04/09/21 2202 04/10/21 0244 04/10/21 0500 04/12/21 0131  NA 136  --  139 132*  K 3.9  --  3.6 4.2  CL 103  --  105 98  CO2 25  --  24 23  GLUCOSE 370*  --  264* 313*  BUN 22*  --  20 30*  CREATININE 0.95  --  0.87 1.00  CALCIUM 8.9  --  9.1 8.8*  MG  --  1.9 1.8  --    GFR: Estimated Creatinine Clearance: 64 mL/min (by C-G formula based on SCr of 1 mg/dL). Liver Function Tests: Recent Labs  Lab 04/09/21 2202 04/10/21 0500 04/12/21 0131  AST 19 17 14*  ALT 19 15 16   ALKPHOS 77 64 60  BILITOT 0.7 0.5 0.5  PROT 6.7 6.3* 5.9*  ALBUMIN 3.8 3.6 3.1*   No results for input(s): LIPASE, AMYLASE in the last 168 hours. Recent Labs  Lab 04/10/21 0243  AMMONIA 37*   Coagulation Profile: No results for input(s): INR, PROTIME in the last 168 hours.  Cardiac Enzymes: No results for  input(s): CKTOTAL, CKMB, CKMBINDEX, TROPONINI in the last 168 hours. BNP (last 3 results) No results for input(s): PROBNP in the last 8760 hours. HbA1C: No results for input(s): HGBA1C in the last 72 hours. CBG: Recent Labs  Lab 04/14/21 1156 04/14/21 1650 04/14/21 2153 04/15/21 0737 04/15/21 1142  GLUCAP 351* 257* 88 246* 342*   Lipid Profile: No results for input(s): CHOL, HDL, LDLCALC, TRIG, CHOLHDL, LDLDIRECT in the last 72 hours. Thyroid Function Tests: No results for input(s): TSH, T4TOTAL, FREET4, T3FREE, THYROIDAB in the last 72 hours. Anemia Panel: No results for input(s): VITAMINB12, FOLATE, FERRITIN, TIBC, IRON, RETICCTPCT in the last 72 hours. Sepsis Labs: No results for input(s): PROCALCITON, LATICACIDVEN in the last 168 hours.  Recent Results (from the past 240 hour(s))  Resp Panel by RT-PCR (Flu A&B, Covid) Nasopharyngeal Swab     Status: None   Collection Time: 04/07/21  8:21 PM   Specimen: Nasopharyngeal Swab; Nasopharyngeal(NP) swabs in vial transport medium  Result Value Ref Range Status   SARS Coronavirus 2 by RT PCR NEGATIVE NEGATIVE Final    Comment: (NOTE) SARS-CoV-2 target nucleic acids are NOT DETECTED.  The SARS-CoV-2 RNA is generally detectable in upper respiratory specimens during the acute phase of infection. The lowest concentration of SARS-CoV-2 viral copies this assay can detect is 138 copies/mL. A negative result does not preclude SARS-Cov-2 infection and should not be used as the sole basis for treatment or other patient management decisions. A negative result may occur with  improper specimen collection/handling, submission of specimen other than nasopharyngeal swab, presence of viral mutation(s) within the areas targeted by this assay, and inadequate number of viral copies(<138 copies/mL). A negative result must be combined with clinical observations, patient history, and epidemiological information. The expected result is  Negative.  Fact Sheet for Patients:  EntrepreneurPulse.com.au  Fact Sheet for Healthcare Providers:  IncredibleEmployment.be  This test is no t yet approved or cleared by the [place] FDA and  has been authorized for detection and/or diagnosis of SARS-CoV-2 by FDA under an Emergency Use Authorization (EUA). This EUA will remain  in effect (meaning this test can be used) for the duration of the COVID-19 declaration under Section 564(b)(1) of the Act, 21 U.S.C.section 360bbb-3(b)(1), unless the authorization is terminated  or revoked sooner.       Influenza A by PCR NEGATIVE NEGATIVE Final   Influenza B by PCR NEGATIVE NEGATIVE Final    Comment: (NOTE) The Xpert Xpress SARS-CoV-2/FLU/RSV plus assay is intended as an aid in the diagnosis of influenza from Nasopharyngeal swab specimens and should not be used as a sole basis for treatment. Nasal washings and aspirates are unacceptable for Xpert Xpress SARS-CoV-2/FLU/RSV testing.  Fact Sheet for Patients: EntrepreneurPulse.com.au  Fact Sheet for Healthcare Providers: IncredibleEmployment.be  This  test is not yet approved or cleared by the [place] and has been authorized for detection and/or diagnosis of SARS-CoV-2 by FDA under an Emergency Use Authorization (EUA). This EUA will remain in effect (meaning this test can be used) for the duration of the COVID-19 declaration under Section 564(b)(1) of the Act, 21 U.S.C. section 360bbb-3(b)(1), unless the authorization is terminated or revoked.  Performed at [facility], [address], [place], [place] [address]   SARS CORONAVIRUS 2 (TAT 6-24 HRS) Nasopharyngeal Nasopharyngeal Swab     Status: None   Collection Time: 04/10/21 12:28 AM   Specimen: Nasopharyngeal Swab  Result Value Ref Range Status   SARS Coronavirus 2 NEGATIVE NEGATIVE Final    Comment: (NOTE) SARS-CoV-2 target nucleic acids are  NOT DETECTED.  The SARS-CoV-2 RNA is generally detectable in upper and lower respiratory specimens during the acute phase of infection. Negative results do not preclude SARS-CoV-2 infection, do not rule out co-infections with other pathogens, and should not be used as the sole basis for treatment or other patient management decisions. Negative results must be combined with clinical observations, patient history, and epidemiological information. The expected result is Negative.  Fact Sheet for Patients: SugarRoll.be  Fact Sheet for Healthcare Providers: https://www.woods-mathews.com/  This test is not yet approved or cleared by the [place] FDA and  has been authorized for detection and/or diagnosis of SARS-CoV-2 by FDA under an Emergency Use Authorization (EUA). This EUA will remain  in effect (meaning this test can be used) for the duration of the COVID-19 declaration under Se ction 564(b)(1) of the Act, 21 U.S.C. section 360bbb-3(b)(1), unless the authorization is terminated or revoked sooner.  Performed at [facility], [address], [place], [place] [address]          Radiology Studies: No results found.      Scheduled Meds:  apixaban  5 mg Oral BID   arformoterol  15 mcg Nebulization BID   atorvastatin  10 mg Oral QPM   budesonide  1 mg Nebulization BID   diltiazem  360 mg Oral Daily   divalproex  1,000 mg Oral QHS   divalproex  500 mg Oral Daily   docusate sodium  100 mg Oral BID   gabapentin  300 mg Oral TID   hydrALAZINE  25 mg Oral TID   insulin aspart  0-20 Units Subcutaneous TID WC   insulin aspart  0-5 Units Subcutaneous QHS   insulin aspart  18 Units Subcutaneous TID WC   insulin glargine  55 Units Subcutaneous QHS   levothyroxine  112 mcg Oral Q0600   metoCLOPramide  5 mg Oral TID WC   montelukast  10 mg Oral Daily   pantoprazole  40 mg Oral BID AC   [START ON 04/16/2021] predniSONE  40 mg  Oral Q breakfast   topiramate  100 mg Oral BID   torsemide  20 mg Oral Daily   umeclidinium bromide  1 puff Inhalation Daily   Continuous Infusions:   LOS: 5 days    Time spent: 41 minutes spent on chart review, discussion with nursing staff, consultants, updating family and interview/physical exam; more than 50% of that time was spent in counseling and/or coordination of care.    Dwan Fennel J [place] DO [facility] Available via Epic secure chat 7am-7pm After these hours, please refer to coverage provider listed on [email or web address] 04/15/2021, 12:16 PM

## 2021-04-15 NOTE — Care Management Important Message (Signed)
Important Message  Patient Details  Name: Laura [name] MRN: [id number] Date of Birth: 1961-05-28   Medicare Important Message Given:  Yes     Shelda Altes 04/15/2021, 9:30 AM

## 2021-04-15 NOTE — Progress Notes (Signed)
Inpatient Diabetes Program Recommendations  AACE/ADA: New Consensus Statement on Inpatient Glycemic Control (2015)  Target Ranges:  Prepandial:   less than 140 mg/dL      Peak postprandial:   less than 180 mg/dL (1-2 hours)      Critically ill patients:  140 - 180 mg/dL   Lab Results  Component Value Date   GLUCAP 342 (H) 04/15/2021   HGBA1C 6.8 (H) 04/08/2021    Review of Glycemic Control Results for Laura [name], Laura [name]" (MRN [id number]) as of 04/15/2021 11:22  Ref. Range 04/15/2021 07:37 04/15/2021 11:42  Glucose-Capillary Latest Ref Range: 70 - 99 mg/dL 246 (H) 342 (H)   Diabetes history: Type 2 DM Current orders for Inpatient glycemic control: Lantus 55 units QHS, Novolog 15 units TID, Novolog 0-20 units TID  &HS Solumedrol 40 mg QD (taper to begin 7/7)  Inpatient Diabetes Program Recommendations:    Consider increasing meal coverage to Novolog 18 units TID (assuming patient is consuming >50% of meals).   Thanks, Bronson Curb, MSN, RNC-OB Diabetes Coordinator [phone] (8a-5p)

## 2021-04-15 NOTE — TOC Initial Note (Signed)
Transition of Care [facility]) - Initial/Assessment Note    Patient Details  Name: Laura [name] MRN: [id number] Date of Birth: 1961/05/31  Transition of Care [facility]) CM/SW Contact:    Bethena Roys, RN Phone Number: 04/15/2021, 1:50 PM  Clinical Narrative: Case Manager spoke with patient today and she is agreeable to home health physical therapy services. Medicare.gov list provided. Patient wanted to use [facility] in [place]. Referral made to [facility] and start of care to begin within 24-48 hours post transition home. Patient will need HH PT order and F2F. Patient in need of a rollator- Case Manager placed order and referral made to [facility] for delivery to bedside prior to transition home. Patient states she has a CPAP and cane at home. Case Manager will continue to follow for home oxygen and other transitional care needs.    Expected Discharge Plan: [place] Barriers to Discharge: No Barriers Identified   Patient Goals and CMS Choice Patient states their goals for this hospitalization and ongoing recovery are:: to return home with family support. CMS Medicare.gov Compare Post Acute Care list provided to:: Patient    Expected Discharge Plan and Services Expected Discharge Plan: [place]   Discharge Planning Services: CM Consult Post Acute Care Choice: Home Health, Durable Medical Equipment Living arrangements for the past 2 months: Mobile Home                 DME Arranged: Walker rolling with seat DME Agency: [facility] Date DME Agency Contacted: 04/15/21 Time DME Agency Contacted: [id number] Representative spoke with at DME Agency: Freda Munro HH Arranged: PT [place]: [place] ([place]) Date [place]: 04/15/21 Time [place]: 36 Representative spoke with at [place]: [place] Arrangements/Services Living arrangements for the past 2 months: Mobile Home Lives  with:: Siblings, Spouse Patient language and need for interpreter reviewed:: Yes Do you feel safe going back to the place where you live?: Yes      Need for Family Participation in Patient Care: Yes (Comment) Care giver support system in place?: Yes (comment) Current home services: DME (Patient has CPAP and cane.) Criminal Activity/Legal Involvement Pertinent to Current Situation/Hospitalization: No - Comment as needed  Activities of Daily Living Home Assistive Devices/Equipment: Cane (specify quad or straight) ADL Screening (condition at time of admission) Patient's cognitive ability adequate to safely complete daily activities?: Yes Is the patient deaf or have difficulty hearing?: No Does the patient have difficulty seeing, even when wearing glasses/contacts?: No Does the patient have difficulty concentrating, remembering, or making decisions?: No Patient able to express need for assistance with ADLs?: Yes Does the patient have difficulty dressing or bathing?: No Independently performs ADLs?: Yes (appropriate for developmental age) Does the patient have difficulty walking or climbing stairs?: Yes Weakness of Legs: Both Weakness of Arms/Hands: None  Permission Sought/Granted Permission sought to share information with : Facility Sport and exercise psychologist, Tourist information centre manager, Family Supports Permission granted to share information with : Yes, Verbal Permission Granted     Permission granted to share info w AGENCY: [facility]        Emotional Assessment Appearance:: Appears stated age Attitude/Demeanor/Rapport: Engaged Affect (typically observed): Appropriate Orientation: : Oriented to Situation, Oriented to Self, Oriented to Place, Oriented to  Time Alcohol / Substance Use: Not Applicable Psych Involvement: No (comment)  Admission diagnosis:  Hyperglycemia [R73.9] Atrial fibrillation with rapid ventricular response (HCC) [I48.91] Exacerbation of asthma, unspecified asthma  severity, unspecified  whether persistent [J45.901] Asthma exacerbation [J45.901] Patient Active Problem List   Diagnosis Date Noted   Paroxysmal atrial fibrillation with rapid ventricular response ([place]) 04/10/2021   Mixed 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 ([place]) 04/10/2021   GERD without esophagitis 04/10/2021   Asthma exacerbation 04/10/2021   Severe persistent asthma with (acute) exacerbation 04/07/2021   Adnexal cyst 01/19/2021   Chronic RLQ pain 01/19/2021   Mixed stress and urge urinary incontinence 01/19/2021   Other fatigue 12/10/2020   Abdominal pain, chronic, right lower quadrant 12/08/2020   Abnormal weight loss 12/08/2020   Poor appetite 12/08/2020   Abnormal EKG    Chronic diastolic (congestive) heart failure ([place]) 08/29/2019   Shortness of breath 08/29/2019   Musculoskeletal chest pain 08/28/2019   Bipolar disorder ([place]) 08/10/2018   Headache 08/10/2018   H/O atrial flutter 08/10/2018   OSA on CPAP 08/10/2018   Anxiety 08/10/2018   Uncontrolled type 2 diabetes mellitus with hyperglycemia ([place]) 03/13/2018   Hypothyroidism 03/13/2018   Essential hypertension, benign 03/13/2018   Mixed hyperlipidemia 03/13/2018   Hypogammaglobulinemia ([place]) 02/06/2018   Non-allergic rhinitis 02/06/2018   Severe persistent asthma, uncomplicated 45/85/9292   Pulmonary nodules 02/06/2018   Bronchiectasis without complication ([place]) 44/62/8638   Type 2 diabetes mellitus with hyperglycemia, with long-term current use of insulin ([place]) 02/06/2018   PCP:  [name], MD Pharmacy:   [facility], [address] AT [place] [place] [address] Phone: [phone] Fax: [phone]   Readmission Risk Interventions No flowsheet data found.

## 2021-04-16 ENCOUNTER — Inpatient Hospital Stay (HOSPITAL_COMMUNITY): Payer: Medicare Other

## 2021-04-16 DIAGNOSIS — I480 Paroxysmal atrial fibrillation: Secondary | ICD-10-CM | POA: Diagnosis not present

## 2021-04-16 LAB — BASIC METABOLIC PANEL
Anion gap: 11 (ref 5–15)
BUN: 28 mg/dL — ABNORMAL HIGH (ref 6–20)
CO2: 27 mmol/L (ref 22–32)
Calcium: 9 mg/dL (ref 8.9–10.3)
Chloride: 96 mmol/L — ABNORMAL LOW (ref 98–111)
Creatinine, Ser: 0.78 mg/dL (ref 0.44–1.00)
GFR, Estimated: >60 mL/min (ref >60)
Glucose, Bld: 106 mg/dL — ABNORMAL HIGH (ref 70–99)
Potassium: 3.5 mmol/L (ref 3.5–5.1)
Sodium: 134 mmol/L — ABNORMAL LOW (ref 135–145)

## 2021-04-16 LAB — GLUCOSE, CAPILLARY
Glucose-Capillary: 101 mg/dL — ABNORMAL HIGH (ref 70–99)
Glucose-Capillary: 212 mg/dL — ABNORMAL HIGH (ref 70–99)
Glucose-Capillary: 79 mg/dL (ref 70–99)
Glucose-Capillary: 117 mg/dL — ABNORMAL HIGH (ref 70–99)
Glucose-Capillary: 360 mg/dL — ABNORMAL HIGH (ref 70–99)

## 2021-04-16 MED ORDER — MAGNESIUM CITRATE PO SOLN
1.0000 | Freq: Once | ORAL | Status: AC
  Administered 2021-04-16: 1 via ORAL
  Filled 2021-04-16: qty 296

## 2021-04-16 NOTE — Progress Notes (Signed)
Physical Therapy Treatment Patient Details Name: Laura [name] MRN: [id number] DOB: 05/04/1961 Today's Date: 04/16/2021    History of Present Illness Pt adm 7/1 with SOB and chest pain. Pt found to have afib with RVR and asthma exacerbation. PMH - DM, HTN, PTSD, bipolar, OSA, chf, TIA, neuropathy, arthritis    PT Comments    Pt progressing well with mobility, not currently requiring any physical assist for mobility/balance. Pt ambulatory in hallway with rollator and cuing for form/safety, PT also cuing pt to take standing rest breaks as needed to recover fatigue/dyspnea. on RA, SpO2 91-96%, see O2 qualification documation below. PT to continue to follow acutely.   SATURATION QUALIFICATIONS: (This note is used to comply with regulatory documentation for home oxygen)  Patient Saturations on Room Air at Rest = 96%  Patient Saturations on Room Air while Ambulating = 91%  Patient Saturations on -- Liters of oxygen while Ambulating = --%  Please briefly explain why patient needs home oxygen: does not qualify for home O2   Follow Up Recommendations  Home health PT     Equipment Recommendations  Other (comment) (rollator)    Recommendations for Other Services       Precautions / Restrictions Precautions Precautions: Fall Restrictions Weight Bearing Restrictions: No    Mobility  Bed Mobility Overal bed mobility: Modified Independent             General bed mobility comments: mod I for increased time    Transfers Overall transfer level: Needs assistance Equipment used: 4-wheeled walker;1 person hand held assist Transfers: Sit to/from Stand Sit to Stand: Min guard         General transfer comment: pt reaching for PT hand for power up to standing, PT not providing physical boost other than HHA. STS x2, from EOB.  Ambulation/Gait Ambulation/Gait assistance: Supervision   Assistive device: 4-wheeled walker Gait Pattern/deviations: Step-through pattern;Decreased  stride length;Trunk flexed Gait velocity: decr   General Gait Details: supervision for safety, verbal cuing for upright posture and standing rest breaks as needed to recover dyspnea. SPO2 91-96% on RA during gait   Stairs             Wheelchair Mobility    Modified Rankin (Stroke Patients Only)       Balance Overall balance assessment: Needs assistance Sitting-balance support: No upper extremity supported;Feet supported Sitting balance-Leahy Scale: Normal     Standing balance support: No upper extremity supported;During functional activity Standing balance-Leahy Scale: Fair Standing balance comment: able to stand without UE support, reaches for environment to self-steady dynamically                            Cognition Arousal/Alertness: Awake/alert Behavior During Therapy: WFL for tasks assessed/performed Overall Cognitive Status: Within Functional Limits for tasks assessed                                        Exercises      General Comments General comments (skin integrity, edema, etc.): on RA, SpO2 91-96%      Pertinent Vitals/Pain Pain Assessment: Faces Faces Pain Scale: Hurts little more Pain Location: chest, lungs (frequent coughing) Pain Descriptors / Indicators: Tightness;Discomfort Pain Intervention(s): Limited activity within patient's tolerance;Monitored during session    Home Living  Prior Function            PT Goals (current goals can now be found in the care plan section) Acute Rehab PT Goals Patient Stated Goal: return home PT Goal Formulation: With patient Time For Goal Achievement: 04/28/21 Potential to Achieve Goals: Good Progress towards PT goals: Progressing toward goals    Frequency    Min 3X/week      PT Plan Current plan remains appropriate    Co-evaluation              AM-PAC PT "6 Clicks" Mobility   Outcome Measure  Help needed turning from your  back to your side while in a flat bed without using bedrails?: None Help needed moving from lying on your back to sitting on the side of a flat bed without using bedrails?: None Help needed moving to and from a bed to a chair (including a wheelchair)?: A Little Help needed standing up from a chair using your arms (e.g., wheelchair or bedside chair)?: A Little Help needed to walk in hospital room?: A Little Help needed climbing 3-5 steps with a railing? : A Little 6 Click Score: 20    End of Session   Activity Tolerance: Patient limited by fatigue Patient left: in bed;with call bell/phone within reach Nurse Communication: Mobility status PT Visit Diagnosis: Unsteadiness on feet (R26.81);Other abnormalities of gait and mobility (R26.89);Muscle weakness (generalized) (M62.81)     Time: 6811-5726 PT Time Calculation (min) (ACUTE ONLY): 24 min  Charges:  $Gait Training: 8-22 mins $Therapeutic Activity: 8-22 mins                    Stacie Glaze, PT DPT Acute Rehabilitation Services Pager [phone]  Office [phone]    [place] 04/16/2021, 9:51 AM

## 2021-04-16 NOTE — Evaluation (Signed)
Clinical/Bedside Swallow Evaluation Patient Details  Name: Laura [name] MRN: [id number] Date of Birth: 09-27-1961  Today's Date: 04/16/2021 Time: SLP Start Time (ACUTE ONLY): 46 SLP Stop Time (ACUTE ONLY): 1421 SLP Time Calculation (min) (ACUTE ONLY): 14 min  Past Medical History:  Past Medical History:  Diagnosis Date   Asthma    Atrial fibrillation (HCC)    Bipolar disorder ([place])    Bronchiectasis (HCC)    Carpal tunnel syndrome of right wrist    CHF (congestive heart failure) (HCC)    Complication of anesthesia    "my Dr in [place] told me I had an allergic reaction to the combination of the pain meds and the anesthesia" she does not remember what happened but "I eneded up in the ICU for 5 days".   H/O congenital atrial septal defect (ASD) repair    Hypogammaglobulinemia ([place])    Hypothyroid    IgE deficiency (HCC)    Neuropathy    Neuropathy    Osteoarthritis    Pinched nerve in neck    PTSD (post-traumatic stress disorder)    Recurrent sinusitis    Short-term memory loss    Sleep apnea    TIA (transient ischemic attack)    Tremors of nervous system    seeing PCP for this.   Type 2 diabetes mellitus (HCC)    Urticaria    Wound infection    Past Surgical History:  Past Surgical History:  Procedure Laterality Date   ASD REPAIR  1968   BIOPSY  01/26/2021   Procedure: BIOPSY;  Surgeon: [name], DO;  [facility];  Service: Endoscopy;;  gastric   CARDIAC SURGERY     CARDIOVERSION     CARDIOVERSION N/A 08/29/2018   Procedure: CARDIOVERSION;  Surgeon: Arnoldo Lenis, MD;  Location: [facility];  Service: Endoscopy;  Laterality: N/A;   [place]   COLONOSCOPY WITH PROPOFOL N/A 01/26/2021   Procedure: COLONOSCOPY WITH PROPOFOL;  Surgeon: [name], DO;  Location: [facility];  Service: Endoscopy;  Laterality: N/A;  am appt, diabetic   ESOPHAGOGASTRODUODENOSCOPY (EGD) WITH PROPOFOL N/A 01/26/2021   Procedure:  ESOPHAGOGASTRODUODENOSCOPY (EGD) WITH PROPOFOL;  Surgeon: [name], DO;  [facility]: [facility];  Service: Endoscopy;  Laterality: N/A;   LEFT HEART CATH AND CORONARY ANGIOGRAPHY N/A 08/30/2019   Procedure: LEFT HEART CATH AND CORONARY ANGIOGRAPHY;  Surgeon: [name], MD;  Location: [facility];  Service: Cardiovascular;  Laterality: N/A;   NASAL SINUS SURGERY  2017   POLYPECTOMY  01/26/2021   Procedure: POLYPECTOMY;  Surgeon: [name], DO;  Location: [facility];  Service: Endoscopy;;  colon   HPI:  Pt is a 60 y/o female admitted 7/1 with SOB and chest pain. Pt found to have Afib with RVR and asthma exacerbation. CXR 7/1 was negative. PMH: ASD s/p repair, paroxysmal atrial fibrillation s/p DCCV, insulin-dependent diabetes mellitus, PTSD, bipolar disorder, hypothyroidism, hyperlipidemia, OSA on CPAP, essential hypertension, GERD, hypogammaglobulinemia, GERD   Assessment / Plan / Recommendation Clinical Impression  Pt was seen for bedside swallow evaluation. Pt reported that she has been demonstrating coughing with p.o. intake and globus sensation with solids and intermittently with liquids which she described as "catching". She identified the sternal notch as the main site as the sensation with some discomfort in the mid chest as well. Oral mechanism exam was [facility] and she presented with adequate, natural dentition. Pt demonstrated symptoms of oropharyngeal dysphagia  characterized by signs of aspiration with all solids and liquids and multiple swallows with solids. Pt demonstrated effortful swallows and a posterior head tilt with purees which she reported assisted in bolus transport. Coughing was noted at baseline and the possibility of some coughing being unrelated is considered. A modified barium swallow study is recommended to further assess pharyngeal physiology. However, pt's report of globus sensation inferior to the sternal notch does raise suspicion for  possible esophageal involvement. Diet recommendations will be deferred until the MBS is completed. SLP Visit Diagnosis: Dysphagia, unspecified (R13.10)    Aspiration Risk  Mild aspiration risk    Diet Recommendation  (TBD based on results of MBS)   Postural Changes: Seated upright at 90 degrees    Other  Recommendations Oral Care Recommendations: Oral care BID   Follow up Recommendations  (TBD)      Frequency and Duration            Prognosis Prognosis for Safe Diet Advancement: Good Barriers to Reach Goals: Severity of deficits      Swallow Study   General Date of Onset: 04/15/21 HPI: Pt is a 60 y/o female admitted 7/1 with SOB and chest pain. Pt found to have Afib with RVR and asthma exacerbation. CXR 7/1 was negative. PMH: ASD s/p repair, paroxysmal atrial fibrillation s/p DCCV, insulin-dependent diabetes mellitus, PTSD, bipolar disorder, hypothyroidism, hyperlipidemia, OSA on CPAP, essential hypertension, GERD, hypogammaglobulinemia, GERD Type of Study: Bedside Swallow Evaluation Previous Swallow Assessment: none Diet Prior to this Study: Regular;Thin liquids Temperature Spikes Noted: No Respiratory Status: Nasal cannula History of Recent Intubation: No Behavior/Cognition: Alert;Cooperative;Pleasant mood Oral Cavity Assessment: Within Functional Limits Oral Care Completed by SLP: No Oral Cavity - Dentition: Adequate natural dentition Vision: Functional for self-feeding Self-Feeding Abilities: Able to feed self Patient Positioning: Upright in chair;Postural control adequate for testing Baseline Vocal Quality: Normal Volitional Cough: Strong Volitional Swallow: Able to elicit    Oral/Motor/Sensory Function Overall Oral Motor/Sensory Function: Within functional limits   Ice Chips Ice chips: Not tested   Thin Liquid Thin Liquid: Impaired Presentation: Straw    Nectar Thick Nectar Thick Liquid: Not tested   Honey Thick Honey Thick Liquid: Not tested   Puree Puree:  Impaired Presentation: Spoon Pharyngeal Phase Impairments: Cough - Immediate;Multiple swallows (posterior head tilt)   Solid     Solid: Impaired Presentation: Self Fed Pharyngeal Phase Impairments: Cough - Immediate     Nicolas Sisler I. Hardin Negus, [place], [place] Office number [phone] Pager [phone]  [place] 04/16/2021,2:36 PM

## 2021-04-16 NOTE — Progress Notes (Signed)
SATURATION QUALIFICATIONS: (This note is used to comply with regulatory documentation for home oxygen)  Patient Saturations on Room Air at Rest = 97%  Patient Saturations on Room Air while Ambulating = 92%   

## 2021-04-16 NOTE — Progress Notes (Signed)
Modified Barium Swallow Progress Note  Patient Details  Name: Terriona Horlacher MRN: [id number] Date of Birth: 1961/06/20  Today's Date: 04/16/2021  Modified Barium Swallow completed.  Full report located under Chart Review in the Imaging Section.  Brief recommendations include the following:  Clinical Impression  Pt was seen in radiology suite for modified barium swallow study. Trials of puree solids, regular texture solids, a 48mm barium tablet, and thin liquids via cup and straw were administered. Pt's oropharyngeal swallow mechanism was within normal limits. Coughing was noted throughout the study; however, no instances of penetration or aspiration were demonstrated and pt was able to adequately coordinate respiration and deglutition, and protect her airway despite constant coughing which often required sudden inspiration after boluses had already been propelled to the valleculae. Esophageal screening revealed stasis of barium in the mid and lower esophagus which improved slightly with a secondary swallow. This aligns with pt's c/o globus sensation and esophageal assessment (e.g., esophagram) is recommended to determine etiology of this. Dr. [place] was advised of results and recommendations. Pt reported that her discharge is planned for 7/9 and that she is amenable to completing an esophagram as an outpatient. Pt reported that she will likely continue to order some softer foods like meatloaf, but it was agreed that her current diet of regular texture solids and thin liquids will be continued to maximize her options. Further skilled SLP services are not clinically indicated at this time.   Swallow Evaluation Recommendations   Recommended Consults: Consider esophageal assessment   SLP Diet Recommendations: Regular solids;Thin liquid   Liquid Administration via: Cup;Straw   Medication Administration: Whole meds with liquid   Supervision: Patient able to self feed   Compensations: Slow rate    Postural Changes: Remain semi-upright after after feeds/meals (Comment);Seated upright at 90 degrees   Oral Care Recommendations: Oral care BID       Raybon Conard I. Hardin Negus, [place], [place] Office number [phone] Pager [place] 04/16/2021,3:21 PM

## 2021-04-16 NOTE — Progress Notes (Signed)
PROGRESS NOTE    Laura [name]  OQH:[id number] DOB: 1961-07-01 DOA: 04/09/2021 PCP: [name], MD    Brief Narrative:  Laura [name] is a 60 year old female with past medical history significant for ASD s/p repair, paroxysmal atrial fibrillation s/p DCCV, insulin-dependent diabetes mellitus, PTSD, bipolar disorder, hypothyroidism, hyperlipidemia, OSA on CPAP, essential hypertension, GERD, hypogammaglobulinemia, GERD who presented to [facility] on 7/1 via EMS for shortness of breath and chest pain.  Patient reports her shortness of breath worse with minimal exertion and improved with rest.  Also associated with a persistent cough that is productive with green sputum with associated intermittent subjective fever.  Chest pain is sharp in quality, localized to the left anterior chest, nonradiating and worse with deep inspiration or cough.  On EMS arrival, patient was noted in A. fib with RVR.  Patient was given sublingual nitroglycerin with improvement of chest pain by EMS.  Patient recently hospitalized to [facility] from 6/29 until 6/30 for asthma exacerbation and discharged on a course of oral prednisone.  In the ED, patient was noted to be in A. fib with RVR and started on a Cardizem drip.  Patient was also started on nebulized bronchodilator therapy and given IV Solu-Medrol.  Hospitalist service consulted for further evaluation management of A. fib with RVR and acute asthma exacerbation.   Assessment & Plan:   Principal Problem:   Paroxysmal atrial fibrillation with rapid ventricular response (HCC) Active Problems:   Hypogammaglobulinemia ([place])   Type 2 diabetes mellitus with hyperglycemia, with long-term current use of insulin (HCC)   Hypothyroidism   Essential hypertension, benign   OSA on CPAP   Musculoskeletal chest pain   Severe persistent asthma with (acute) exacerbation   Mixed 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 (HCC)   GERD without  esophagitis   Asthma exacerbation   Paroxysmal atrial fibrillation with RVR Patient presenting with A. fib with RVR, heart rates greater than 140 on ED presentation.  Likely contributing to patient's ongoing respiratory distress.  Initially started on a Cardizem drip now titrated off.  --Cardizem CD 360mg  PO daily  --Apixaban 5 mg p.o. twice daily --Continue monitor on telemetry  Severe persistent asthma with acute exacerbation Patient presenting with significant dyspnea with associated coughing. On Fasenra 30mg  [place] q8 wks outpatient.  Chest x-ray with no acute cardiopulmonary disease process. --Pulmicort neb BID --Brovana neb BID --Prednisone 40mg  PO daily on 7/8 --Singulair 10 mg p.o. daily --Incruse Ellipta 1 puff daily --DuoNebs prn --Tessalon/guaifenesin-codeine prn cough --continue supplemental oxygen, titrate to maintain SpO2 >92% --Incentive spirometry/flutter valve --Ambulatory O2 screening today with no hypoxia noted with no hypoxia noted  Elevated troponin High-sensitivity troponin slightly elevated at 19, 20.  EKG without dynamic changes.  Etiology likely secondary to demand ischemia from A. fib with RVR as above. --Continue monitor on telemetry  Hypothyroidism TSH 2.048, within normal limits. --Levothyroxine 112 mcg p.o. daily  OSA: Continue nocturnal CPAP  Musculoskeletal chest pain Patient reported left-sided intermittent chest discomfort, sharp in nature without radiation.  Etiology likely secondary to musculoskeletal strain from persistent cough.  Essential hypertension --Hydralazine 25 mg p.o. 3 times daily --Cardizem CD 360 mg p.o. daily --Torsemide 20 mg p.o. daily  Hyperlipidemia: Atorvastatin 10 mg p.o. daily  Type 2 diabetes mellitus Hemoglobin A1c 6.8 on 6/30, well controlled.  Home regimen includes Lantus 40 units subcutaneously nightly --Lantus 55 units immensely nightly --NovoLog 18 units TIDAC --Resistant ISS for further coverage --CBGs  qAC/HS  GERD: Protonix 40 mg p.o.  twice daily  PTSD Bipolar disorder --Depakote 5 mg p.o. daily --Depakote 1000 mg nightly --Topamax 100 mg p.o. twice daily --Outpatient follow-up with psychiatry   DVT prophylaxis:  apixaban (ELIQUIS) tablet 5 mg    Code Status: Full Code Family Communication: No family present at bedside this morning  Disposition Plan:  Level of care: Telemetry Medical Status is: Inpatient  Remains inpatient appropriate because:Ongoing diagnostic testing needed not appropriate for outpatient work up, Unsafe d/c plan, IV treatments appropriate due to intensity of illness or inability to take PO, and Inpatient level of care appropriate due to severity of illness  Dispo: The patient is from: Home              Anticipated d/c is to: Home              Patient currently is not medically stable to d/c.   Difficult to place patient No   Consultants:  none  Procedures:  none  Antimicrobials:  None   Subjective: Patient seen examined at bedside, resting comfortably.  Cough improved, shortness of breath also improved.  Weaned off of supplemental oxygen, no desaturations on ambulatory O2 screening this morning.  Patient complains of difficulty swallowing pills, awaiting SLP evaluation.  Patient reports bowel movement yesterday, but still feels constipated and requesting additional medication for this.  No other questions or concerns at this time.  Denies headache, no visual changes, no current chest pain, no palpitations, no fever/chills/night sweats, no nausea/vomiting/diarrhea, no weakness, no fatigue, no paresthesias.  No acute events overnight per nursing staff.  Objective: Vitals:   04/16/21 0611 04/16/21 0818 04/16/21 0833 04/16/21 1228  BP: (!) 141/77  111/76 (!) 132/59  Pulse: 63 66  77  Resp:  18  18  Temp: 97.6 F (36.4 C)  97.6 F (36.4 C) 98.3 F (36.8 C)  TempSrc: Oral  Oral   SpO2: 98% 99%  96%  Weight:      Height:        Intake/Output  Summary (Last 24 hours) at 04/16/2021 1332 Last data filed at 04/15/2021 1700 Gross per 24 hour  Intake 240 ml  Output --  Net 240 ml   Filed Weights   04/10/21 0017 04/10/21 0225  Weight: 81.6 kg 85.3 kg    Examination:  General exam: Appears calm and comfortable  Respiratory system: Clear to auscultation bilaterally, no wheezing/crackles/rhonchi, normal respiratory effort, on room air. Cardiovascular system: S1 & S2 heard, irregularly irregular rhythm, normal rate. No JVD, murmurs, rubs, gallops or clicks. No pedal edema. Gastrointestinal system: Abdomen is nondistended, soft and nontender. No organomegaly or masses felt. Normal bowel sounds heard. Central nervous system: Alert and oriented. No focal neurological deficits. Extremities: Symmetric 5 x 5 power. Skin: No rashes, lesions or ulcers Psychiatry: Judgement and insight appear normal. Mood & affect appropriate.     Data Reviewed: I have personally reviewed following labs and imaging studies  CBC: Recent Labs  Lab 04/09/21 2202 04/10/21 0500  WBC 8.3 7.8  NEUTROABS 6.1 4.4  HGB 13.5 12.9  HCT 38.2 36.1  MCV 92.9 92.3  PLT 233 614   Basic Metabolic Panel: Recent Labs  Lab 04/09/21 2202 04/10/21 0244 04/10/21 0500 04/12/21 0131 04/16/21 0305  NA 136  --  139 132* 134*  K 3.9  --  3.6 4.2 3.5  CL 103  --  105 98 96*  CO2 25  --  24 23 27   GLUCOSE 370*  --  264* 313* 106*  BUN  22*  --  20 30* 28*  CREATININE 0.95  --  0.87 1.00 0.78  CALCIUM 8.9  --  9.1 8.8* 9.0  MG  --  1.9 1.8  --   --    GFR: Estimated Creatinine Clearance: 80 mL/min (by C-G formula based on SCr of 0.78 mg/dL). Liver Function Tests: Recent Labs  Lab 04/09/21 2202 04/10/21 0500 04/12/21 0131  AST 19 17 14*  ALT 19 15 16   ALKPHOS 77 64 60  BILITOT 0.7 0.5 0.5  PROT 6.7 6.3* 5.9*  ALBUMIN 3.8 3.6 3.1*   No results for input(s): LIPASE, AMYLASE in the last 168 hours. Recent Labs  Lab 04/10/21 0243  AMMONIA 37*   Coagulation  Profile: No results for input(s): INR, PROTIME in the last 168 hours.  Cardiac Enzymes: No results for input(s): CKTOTAL, CKMB, CKMBINDEX, TROPONINI in the last 168 hours. BNP (last 3 results) No results for input(s): PROBNP in the last 8760 hours. HbA1C: No results for input(s): HGBA1C in the last 72 hours. CBG: Recent Labs  Lab 04/15/21 1142 04/15/21 1601 04/15/21 2105 04/16/21 0743 04/16/21 1118  GLUCAP 342* 230* 221* 101* 212*   Lipid Profile: No results for input(s): CHOL, HDL, LDLCALC, TRIG, CHOLHDL, LDLDIRECT in the last 72 hours. Thyroid Function Tests: No results for input(s): TSH, T4TOTAL, FREET4, T3FREE, THYROIDAB in the last 72 hours. Anemia Panel: No results for input(s): VITAMINB12, FOLATE, FERRITIN, TIBC, IRON, RETICCTPCT in the last 72 hours. Sepsis Labs: No results for input(s): PROCALCITON, LATICACIDVEN in the last 168 hours.  Recent Results (from the past 240 hour(s))  Resp Panel by RT-PCR (Flu A&B, Covid) Nasopharyngeal Swab     Status: None   Collection Time: 04/07/21  8:21 PM   Specimen: Nasopharyngeal Swab; Nasopharyngeal(NP) swabs in vial transport medium  Result Value Ref Range Status   SARS Coronavirus 2 by RT PCR NEGATIVE NEGATIVE Final    Comment: (NOTE) SARS-CoV-2 target nucleic acids are NOT DETECTED.  The SARS-CoV-2 RNA is generally detectable in upper respiratory specimens during the acute phase of infection. The lowest concentration of SARS-CoV-2 viral copies this assay can detect is 138 copies/mL. A negative result does not preclude SARS-Cov-2 infection and should not be used as the sole basis for treatment or other patient management decisions. A negative result may occur with  improper specimen collection/handling, submission of specimen other than nasopharyngeal swab, presence of viral mutation(s) within the areas targeted by this assay, and inadequate number of viral copies(<138 copies/mL). A negative result must be combined  with clinical observations, patient history, and epidemiological information. The expected result is Negative.  Fact Sheet for Patients:  EntrepreneurPulse.com.au  Fact Sheet for Healthcare Providers:  IncredibleEmployment.be  This test is no t yet approved or cleared by the [place] FDA and  has been authorized for detection and/or diagnosis of SARS-CoV-2 by FDA under an Emergency Use Authorization (EUA). This EUA will remain  in effect (meaning this test can be used) for the duration of the COVID-19 declaration under Section 564(b)(1) of the Act, 21 U.S.C.section 360bbb-3(b)(1), unless the authorization is terminated  or revoked sooner.       Influenza A by PCR NEGATIVE NEGATIVE Final   Influenza B by PCR NEGATIVE NEGATIVE Final    Comment: (NOTE) The Xpert Xpress SARS-CoV-2/FLU/RSV plus assay is intended as an aid in the diagnosis of influenza from Nasopharyngeal swab specimens and should not be used as a sole basis for treatment. Nasal washings and aspirates are unacceptable for Xpert  Xpress SARS-CoV-2/FLU/RSV testing.  Fact Sheet for Patients: EntrepreneurPulse.com.au  Fact Sheet for Healthcare Providers: IncredibleEmployment.be  This test is not yet approved or cleared by the [place] FDA and has been authorized for detection and/or diagnosis of SARS-CoV-2 by FDA under an Emergency Use Authorization (EUA). This EUA will remain in effect (meaning this test can be used) for the duration of the COVID-19 declaration under Section 564(b)(1) of the Act, 21 U.S.C. section 360bbb-3(b)(1), unless the authorization is terminated or revoked.  Performed at [facility], [address], [place], [place] [address]   SARS CORONAVIRUS 2 (TAT 6-24 HRS) Nasopharyngeal Nasopharyngeal Swab     Status: None   Collection Time: 04/10/21 12:28 AM   Specimen: Nasopharyngeal Swab  Result Value Ref Range Status    SARS Coronavirus 2 NEGATIVE NEGATIVE Final    Comment: (NOTE) SARS-CoV-2 target nucleic acids are NOT DETECTED.  The SARS-CoV-2 RNA is generally detectable in upper and lower respiratory specimens during the acute phase of infection. Negative results do not preclude SARS-CoV-2 infection, do not rule out co-infections with other pathogens, and should not be used as the sole basis for treatment or other patient management decisions. Negative results must be combined with clinical observations, patient history, and epidemiological information. The expected result is Negative.  Fact Sheet for Patients: SugarRoll.be  Fact Sheet for Healthcare Providers: https://www.woods-mathews.com/  This test is not yet approved or cleared by the [place] FDA and  has been authorized for detection and/or diagnosis of SARS-CoV-2 by FDA under an Emergency Use Authorization (EUA). This EUA will remain  in effect (meaning this test can be used) for the duration of the COVID-19 declaration under Se ction 564(b)(1) of the Act, 21 U.S.C. section 360bbb-3(b)(1), unless the authorization is terminated or revoked sooner.  Performed at [facility], [place] [address], [place], [place] [address]          Radiology Studies: No results found.      Scheduled Meds:  apixaban  5 mg Oral BID   arformoterol  15 mcg Nebulization BID   atorvastatin  10 mg Oral QPM   budesonide  1 mg Nebulization BID   diltiazem  360 mg Oral Daily   divalproex  1,000 mg Oral QHS   divalproex  500 mg Oral Daily   docusate sodium  100 mg Oral BID   gabapentin  300 mg Oral TID   hydrALAZINE  25 mg Oral TID   insulin aspart  0-20 Units Subcutaneous TID WC   insulin aspart  0-5 Units Subcutaneous QHS   insulin aspart  18 Units Subcutaneous TID WC   insulin glargine  55 Units Subcutaneous QHS   levothyroxine  112 mcg Oral Q0600   magnesium citrate  1 Bottle Oral Once    metoCLOPramide  5 mg Oral TID WC   montelukast  10 mg Oral Daily   pantoprazole  40 mg Oral BID AC   predniSONE  40 mg Oral Q breakfast   topiramate  100 mg Oral BID   torsemide  20 mg Oral Daily   umeclidinium bromide  1 puff Inhalation Daily   Continuous Infusions:   LOS: 6 days    Time spent: 38 minutes spent on chart review, discussion with nursing staff, consultants, updating family and interview/physical exam; more than 50% of that time was spent in counseling and/or coordination of care.    Antonella Upson J [place] DO [facility] Available via Epic secure chat 7am-7pm After these hours, please refer  to coverage provider listed on [email or web address] 04/16/2021, 1:32 PM

## 2021-04-17 DIAGNOSIS — I480 Paroxysmal atrial fibrillation: Secondary | ICD-10-CM | POA: Diagnosis not present

## 2021-04-17 LAB — GLUCOSE, CAPILLARY
Glucose-Capillary: 182 mg/dL — ABNORMAL HIGH (ref 70–99)
Glucose-Capillary: 70 mg/dL (ref 70–99)

## 2021-04-17 MED ORDER — PREDNISONE 20 MG PO TABS
20.0000 mg | ORAL_TABLET | Freq: Every day | ORAL | Status: DC
  Administered 2021-04-17: 20 mg via ORAL
  Filled 2021-04-17: qty 1

## 2021-04-17 MED ORDER — HUMALOG KWIKPEN 200 UNIT/ML ~~LOC~~ SOPN: 2.0000 [IU] | PEN_INJECTOR | Freq: Three times a day (TID) | SUBCUTANEOUS | 2 refills | Status: DC

## 2021-04-17 MED ORDER — POLYETHYLENE GLYCOL 3350 17 G PO PACK: 17.0000 g | PACK | Freq: Every day | ORAL | 0 refills | Status: DC | PRN

## 2021-04-17 MED ORDER — OXYCODONE-ACETAMINOPHEN 5-325 MG PO TABS: 1.0000 | ORAL_TABLET | Freq: Four times a day (QID) | ORAL | 0 refills | Status: AC | PRN

## 2021-04-17 MED ORDER — "PEN NEEDLES 3/16"" 31G X 5 MM MISC": 0 refills | Status: DC

## 2021-04-17 MED ORDER — PREDNISONE 10 MG PO TABS: ORAL_TABLET | ORAL | 0 refills | Status: AC

## 2021-04-17 NOTE — Plan of Care (Signed)

## 2021-04-17 NOTE — Discharge Summary (Addendum)
Physician Discharge Summary  Symphany Fleissner LPF:[id number] DOB: 1961/03/21 DOA: 04/09/2021  PCP: [name], MD  Admit date: 04/09/2021 Discharge date: 04/17/2021  Admitted From: Home Disposition: Home  Recommendations for Outpatient Follow-up:  Follow up with PCP in 1-2 weeks Follow-up with cardiology and pulmonology as scheduled Will need follow-up with her gastroenterologist in [place] for dysphagia, reports recent colonoscopy/EGD, speech therapy recommends outpatient esophagram Discharged on prednisone taper for asthma exacerbation Humalog insulin sliding scale while on prednisone  Home Health: PT Equipment/Devices: Rollator  Discharge Condition: Stable CODE STATUS: Full code Diet recommendation: Heart healthy/consistent carbohydrate diet  History of present illness:  Laura [name] is a 60 year old female with past medical history significant for ASD s/p repair, paroxysmal atrial fibrillation s/p DCCV, insulin-dependent diabetes mellitus, PTSD, bipolar disorder, hypothyroidism, hyperlipidemia, OSA on CPAP, essential hypertension, GERD, hypogammaglobulinemia, GERD who [facility] EMS for shortness of breath and chest pain.  Patient reports her shortness of breath worse with minimal exertion and improved with rest.  Also associated with a persistent cough that is productive with green sputum with associated intermittent subjective fever.  Chest pain is sharp in quality, localized to the left anterior chest, nonradiating and worse with deep inspiration or cough.  On EMS arrival, patient was noted in A. fib with RVR.  Patient was given sublingual nitroglycerin with improvement of chest pain by EMS.   Patient recently hospitalized to [facility] from 6/29 until 6/30 for asthma exacerbation and discharged on a course of oral prednisone.   In the ED, patient was noted to be in A. fib with RVR and started on a Cardizem drip.  Patient was also started on nebulized  bronchodilator therapy and given IV Solu-Medrol.  Hospitalist service consulted for further evaluation management of A. fib with RVR and acute asthma exacerbation.   Hospital course:  Paroxysmal atrial fibrillation with RVR Patient presenting with A. fib with RVR, heart rates greater than 140 on ED presentation.  Likely contributing to patient's ongoing respiratory distress.  Initially started on a Cardizem drip now titrated off.  Continue Cardizem CD3 60 mg p.o. daily and apixaban 5 mg p.o. twice daily.  Outpatient follow-up with cardiology   Severe persistent asthma with acute exacerbation Patient presenting with significant dyspnea with associated coughing. On Fasenra $RemoveBe'30mg'uheJdpmLw$  [place] q8 wks outpatient.  Chest x-ray with no acute cardiopulmonary disease process.  Patient's initially requiring up to 7 L nasal cannula during hospitalization.  Patient was started on treatment with IV steroids, Pulmicort/Brovana nebulized treatments.  Patient was able to be titrated down slowly to room air with no hypoxia noted on ambulatory O2 screening.  Will discharge home on short prednisone taper.  Resume home inhalers.  Outpatient follow-up with her pulmonologist in [place].  Elevated troponin High-sensitivity troponin slightly elevated at 19, 20.  EKG without dynamic changes.  Etiology likely secondary to demand ischemia from A. fib with RVR as above.  Oropharyngeal dysphagia Patient reports that she has had coughing episodes and some difficulty with swallowing her pills.  Seen by speech therapy and underwent a modified barium swallow study which revealed stasis of the barium in the mid/lower esophagus which improved slightly with a secondary swallow.  Patient follows with gastroenterology in [place], reports recent EGD/colonoscopy.  Recommend follow-up with GI with consideration of outpatient esophagram.   Hypothyroidism TSH 2.048, within normal limits. Levothyroxine 112 mcg p.o. daily   OSA: Continue  nocturnal CPAP   Musculoskeletal chest pain Patient reported left-sided intermittent chest discomfort,  sharp in nature without radiation.  Etiology likely secondary to musculoskeletal strain from persistent cough.   Essential hypertension BP well controlled, continue home hydralazine 25 mg p.o. 3 times daily, Cardizem CD 360 mg p.o. daily, Torsemide 20 mg p.o. daily   Hyperlipidemia: Atorvastatin 10 mg p.o. daily   Type 2 diabetes mellitus Hemoglobin A1c 6.8 on 6/30, well controlled.  Home regimen includes Lantus 40 units subcutaneously nightly.  Insulin sliding scale with Humalog while on prednisone.   GERD: Protonix 40 mg p.o. twice daily   PTSD Bipolar disorder Depakote 5 mg p.o. daily, Depakote 1000 mg nightly,Topamax 100 mg p.o. twice daily, Outpatient follow-up with psychiatry  Discharge Diagnoses:  Active Problems:   Hypogammaglobulinemia (HCC)   Type 2 diabetes mellitus with hyperglycemia, with long-term current use of insulin (HCC)   Hypothyroidism   Essential hypertension, benign   OSA on CPAP   Musculoskeletal chest pain   Mixed 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 (HCC)   GERD without esophagitis    Discharge Instructions  Discharge Instructions     Call MD for:  difficulty breathing, headache or visual disturbances   Complete by: As directed    Call MD for:  extreme fatigue   Complete by: As directed    Call MD for:  persistant dizziness or light-headedness   Complete by: As directed    Call MD for:  persistant nausea and vomiting   Complete by: As directed    Call MD for:  severe uncontrolled pain   Complete by: As directed    Call MD for:  temperature >100.4   Complete by: As directed    Diet - low sodium heart healthy   Complete by: As directed    Increase activity slowly   Complete by: As directed       Allergies as of 04/17/2021       Reactions   Ativan [lorazepam] Hives   Phenergan [promethazine Hcl] Hives         Medication List     STOP taking these medications    benzonatate 200 MG capsule Commonly known as: TESSALON       TAKE these medications    acetaminophen 325 MG tablet Commonly known as: TYLENOL Take 2 tablets (650 mg total) by mouth every 6 (six) hours as needed for mild pain, fever or headache.   albuterol (2.5 MG/3ML) 0.083% nebulizer solution Commonly known as: PROVENTIL Take 3 mLs (2.5 mg total) by nebulization every 4 (four) hours as needed for wheezing or shortness of breath. What changed: Another medication with the same name was changed. Make sure you understand how and when to take each.   albuterol 108 (90 Base) MCG/ACT inhaler Commonly known as: Ventolin HFA INHALE 2 PUFFS INTO THE LUNGS EVERY 6 HOURS AS NEEDED FOR WHEEZING OR SHORTNESS OF BREATH What changed:  how much to take how to take this when to take this reasons to take this additional instructions   apixaban 5 MG Tabs tablet Commonly known as: ELIQUIS Take 5 mg by mouth 2 (two) times daily.   atorvastatin 10 MG tablet Commonly known as: LIPITOR Take 10 mg by mouth every evening.   BD Pen Needle Nano 2nd Gen 32G X 4 MM Misc Generic drug: Insulin Pen Needle 1 each by Other route in the morning, at noon, in the evening, and at bedtime. What changed: Another medication with the same name was added. Make sure you understand how and when to take each.  Pen Needles 3/16" 31G X 5 MM Misc Use as directed with insulin pen What changed: You were already taking a medication with the same name, and this prescription was added. Make sure you understand how and when to take each.   blood glucose meter kit and supplies 1 each by Other route 4 (four) times daily. Dispense based on patient and insurance preference. Use up to four times daily as directed. (FOR ICD-10 E10.9, E11.9). Notes to patient: As Before, As Directed   diltiazem 360 MG 24 hr capsule Commonly known as: CARDIZEM CD TAKE 1  CAPSULE(360 MG) BY MOUTH DAILY What changed: See the new instructions.   divalproex 500 MG 24 hr tablet Commonly known as: DEPAKOTE ER 500 mg in AM, 1000 mg at night What changed:  how much to take how to take this when to take this additional instructions   ELDERBERRY PO Take 4 g by mouth daily. Gummies 2g each   EPINEPHrine 0.3 mg/0.3 mL Soaj injection Commonly known as: EPI-PEN Inject 0.3 mg into the muscle once as needed for anaphylaxis.   Fasenra Pen 30 MG/ML Soaj Generic drug: Benralizumab INJECT 30MG  SUBCUTANEOUSLY  EVERY 8 WEEKS (GIVEN AT MD  OFFICE) What changed: See the new instructions.   ferrous sulfate 324 MG Tbec Take 324 mg by mouth daily with breakfast.   fluticasone 220 MCG/ACT inhaler Commonly known as: Flovent HFA 2 puffs twice daily with spacer2 puffs twice daily with spacer What changed:  how much to take how to take this when to take this additional instructions   FreeStyle Libre 2 Reader Kerrin Mo As directed What changed:  how much to take how to take this when to take this additional instructions Notes to patient: As Before, As Directed   FreeStyle Libre 2 Sensor Misc 1 Piece by Does not apply route every 14 (fourteen) days. Notes to patient: As Before, As Directed   gabapentin 300 MG capsule Commonly known as: NEURONTIN Take 300 mg by mouth 3 (three) times daily.   HumaLOG KwikPen 200 UNIT/ML KwikPen Generic drug: insulin lispro Inject 2-10 Units into the skin 3 (three) times daily before meals. Glucose 151-200 take 2 units, glucose 201-250 take 4 units, glucose 251-300 take 6 units, glucose 301-350 take 8 units, glucose 251-400 take 10 units   hydrALAZINE 25 MG tablet Commonly known as: APRESOLINE Take 25 mg by mouth 3 (three) times daily.   hydrOXYzine 25 MG tablet Commonly known as: ATARAX/VISTARIL Take 1 tablet (25 mg total) by mouth every 6 (six) hours as needed for anxiety or nausea.   Immune Globulin (Human) 4 GM/20ML  Soln Inject 100 mLs into the skin every 14 (fourteen) days. Notes to patient: As Before, As Directed   Lantus SoloStar 100 UNIT/ML Solostar Pen Generic drug: insulin glargine Inject 40 Units into the skin at bedtime.   levothyroxine 112 MCG tablet Commonly known as: SYNTHROID Take 112 mcg by mouth daily before breakfast.   Magnesium Oxide 400 MG Caps Take 1 capsule (400 mg total) by mouth daily.   melatonin 3 MG Tabs tablet Take 3 mg by mouth at bedtime.   metoCLOPramide 5 MG tablet Commonly known as: REGLAN Take 5 mg by mouth 3 (three) times daily.   montelukast 10 MG tablet Commonly known as: SINGULAIR Take 1 tablet by mouth daily.   multivitamin with minerals tablet Take 1 tablet by mouth daily. Woman   Nebulizer Misc Nebulizer tubing kit   omeprazole 20 MG capsule Commonly known as: PRILOSEC Take 1  capsule (20 mg total) by mouth 2 (two) times daily before a meal.   ondansetron 4 MG tablet Commonly known as: Zofran Take 1 tablet (4 mg total) by mouth every 8 (eight) hours as needed for nausea or vomiting.   OneTouch Ultra test strip Generic drug: glucose blood 1 each by Other route in the morning, at noon, in the evening, and at bedtime.   oxyCODONE-acetaminophen 5-325 MG tablet Commonly known as: Percocet Take 1 tablet by mouth every 6 (six) hours as needed for up to 5 days for severe pain.   polyethylene glycol 17 g packet Commonly known as: MIRALAX / GLYCOLAX Take 17 g by mouth daily as needed.   Potassium Chloride ER 20 MEQ Tbcr Take 20 mEq by mouth daily.   predniSONE 10 MG tablet Commonly known as: DELTASONE Take 2 tablets (20 mg total) by mouth daily for 2 days, THEN 1 tablet (10 mg total) daily for 2 days, THEN 0.5 tablets (5 mg total) daily for 2 days. Start taking on: April 18, 2021 What changed:  medication strength See the new instructions.   Spiriva Respimat 1.25 MCG/ACT Aers Generic drug: Tiotropium Bromide Monohydrate Inhale 2 puffs  into the lungs daily.   topiramate 100 MG tablet Commonly known as: TOPAMAX Take 1 tablet (100 mg total) by mouth 2 (two) times daily.   torsemide 20 MG tablet Commonly known as: DEMADEX Take 2 tablets (40 mg total) by mouth daily.   triamcinolone cream 0.1 % Commonly known as: KENALOG Apply 1 application topically 2 (two) times daily.   Trulicity 0.21 JD/5.5MC Sopn Generic drug: Dulaglutide ADMINISTER 0.75 MG UNDER THE SKIN 1 TIME A WEEK What changed: See the new instructions. Notes to patient: As Before, As Directed   vitamin B-12 1000 MCG tablet Commonly known as: CYANOCOBALAMIN Take 1,000 mcg by mouth daily.   vitamin C 500 MG tablet Commonly known as: ASCORBIC ACID Take 500 mg by mouth daily. Power C immune support   Vitamin D3 125 MCG (5000 UT) Caps Take 1 capsule (5,000 Units total) by mouth daily.               Durable Medical Equipment  (From admission, onward)           Start     Ordered   04/15/21 1755  For home use only DME 4 wheeled rolling walker with seat  Once       Question:  Patient needs a walker to treat with the following condition  Answer:  Gait disorder   04/15/21 1754   04/15/21 1348  For home use only DME 4 wheeled rolling walker with seat  Once       Question:  Patient needs a walker to treat with the following condition  Answer:  General weakness   04/15/21 [place] Follow up.   Why: Rollator- to be delivered to the room before d/c home. Contact information: [place] [address] [phone]         [facility] Follow up.   Why: Physical Therapy-office to call with visit times. Contact information: [address] [place], John Z, MD. Schedule an appointment as soon as possible for a visit in 1 week(s).   Specialty: Internal Medicine Contact information: [address]  [address]  [place] [address] [phone]         Arnoldo Lenis, MD .   Specialty: Cardiology Contact information: [place] [address] [phone]                Allergies  Allergen Reactions   Ativan [Lorazepam] Hives   Phenergan [Promethazine Hcl] Hives    Consultations: None   Procedures/Studies: DG Chest 2 View  Result Date: 04/07/2021 CLINICAL DATA:  Chest pain, cough EXAM: CHEST - 2 VIEW COMPARISON:  12/07/2020 FINDINGS: Heart and mediastinal contours are within normal limits. No focal opacities or effusions. No acute bony abnormality. IMPRESSION: No active cardiopulmonary disease. Electronically Signed   By: Rolm Baptise M.D.   On: 04/07/2021 19:16   CT Angio Chest PE W and/or Wo Contrast  Result Date: 04/07/2021 CLINICAL DATA:  60 year old female with concern for pulmonary embolism. EXAM: CT ANGIOGRAPHY CHEST WITH CONTRAST TECHNIQUE: Multidetector CT imaging of the chest was performed using the standard protocol during bolus administration of intravenous contrast. Multiplanar CT image reconstructions and MIPs were obtained to evaluate the vascular anatomy. CONTRAST:  136mL OMNIPAQUE IOHEXOL 350 MG/ML SOLN COMPARISON:  Chest CT dated 03/25/2020 and radiograph dated 04/07/2021. FINDINGS: Cardiovascular: There is no cardiomegaly or pericardial effusion. There is coronary vascular calcification and calcification of the mitral annulus. There is mild atherosclerotic calcification of the thoracic aorta. No aneurysmal dilatation or dissection. The origins of the great vessels of the aortic arch appear patent. Evaluation of the pulmonary arteries is limited due to respiratory motion artifact. No large or central pulmonary artery embolus identified. Mediastinum/Nodes: Mildly enlarged right hilar lymph node measuring 13 mm. The esophagus is grossly unremarkable. No mediastinal fluid collection. Lungs/Pleura: The lungs are clear. There is no pleural effusion or  pneumothorax. Mild bilateral lower lobe bronchial thickening and endobronchial secretions. Aspiration is not excluded clinical correlation is recommended. The central airways remain patent. Upper Abdomen: No acute abnormality. Musculoskeletal: Degenerative changes of the spine. No acute osseous pathology. Review of the MIP images confirms the above findings. IMPRESSION: 1. No CT evidence of central pulmonary artery embolus. 2. Mild bilateral lower lobe bronchial thickening and endobronchial secretions. 3. Aortic Atherosclerosis (ICD10-I70.0). Electronically Signed   By: Anner Crete M.D.   On: 04/07/2021 20:44   DG Chest Portable 1 View  Result Date: 04/09/2021 CLINICAL DATA:  Shortness of breath and chest pain EXAM: PORTABLE CHEST 1 VIEW COMPARISON:  04/07/2021 FINDINGS: No focal opacity or pleural effusion. Stable cardiomediastinal silhouette. Mitral annular calcification. No pneumothorax. IMPRESSION: No active disease. Electronically Signed   By: Donavan Foil M.D.   On: 04/09/2021 22:51   DG Swallowing Func-Speech Pathology  Result Date: 04/16/2021 Formatting of this result is different from the original. Objective Swallowing Evaluation: Type of Study: MBS-Modified Barium Swallow Study  Patient Details Name: Laura [name] MRN: [id number] Date of Birth: [birth year] Today's Date: 04/16/2021 Time: SLP Start Time (ACUTE ONLY): 1441 -SLP Stop Time (ACUTE ONLY): 4982 SLP Time Calculation (min) (ACUTE ONLY): 17 min Past Medical History: Past Medical History: Diagnosis Date  Asthma   Atrial fibrillation ([place])   Bipolar disorder ([place])   Bronchiectasis ([place])   Carpal tunnel syndrome of right wrist   CHF (congestive heart failure) ([place])   Complication of anesthesia   "my Dr in [place] told me I had an allergic reaction to the combination of the pain meds and the anesthesia" she does not remember what happened but "I eneded up in the ICU for 5 days".  H/O congenital atrial septal defect (ASD) repair    Hypogammaglobulinemia ([place])   Hypothyroid   IgE deficiency (HCC)   Neuropathy   Neuropathy   Osteoarthritis   Pinched nerve in neck   PTSD (post-traumatic stress disorder)   Recurrent sinusitis   Short-term memory loss   Sleep apnea   TIA (transient ischemic attack)   Tremors of nervous system   seeing PCP for this.  Type 2 diabetes mellitus (HCC)   Urticaria   Wound infection  Past Surgical History: Past Surgical History: Procedure Laterality Date  ASD REPAIR  1968  BIOPSY  01/26/2021  Procedure: BIOPSY;  Surgeon: [name], DO;  [facility];  Service: Endoscopy;;  gastric  CARDIAC SURGERY    CARDIOVERSION    CARDIOVERSION N/A 08/29/2018  Procedure: CARDIOVERSION;  Surgeon: Arnoldo Lenis, MD;  [facility];  Service: Endoscopy;  Laterality: N/A;  [place]  COLONOSCOPY WITH PROPOFOL N/A 01/26/2021  Procedure: COLONOSCOPY WITH PROPOFOL;  Surgeon: [name], DO;  [facility];  Service: Endoscopy;  Laterality: N/A;  am appt, diabetic  ESOPHAGOGASTRODUODENOSCOPY (EGD) WITH PROPOFOL N/A 01/26/2021  Procedure: ESOPHAGOGASTRODUODENOSCOPY (EGD) WITH PROPOFOL;  Surgeon: [name], DO;  [facility];  Service: Endoscopy;  Laterality: N/A;  LEFT HEART CATH AND CORONARY ANGIOGRAPHY N/A 08/30/2019  Procedure: LEFT HEART CATH AND CORONARY ANGIOGRAPHY;  Surgeon: [name], MD;  Location: [facility];  Service: Cardiovascular;  Laterality: N/A;  NASAL SINUS SURGERY  2017  POLYPECTOMY  01/26/2021  Procedure: POLYPECTOMY;  Surgeon: [name], DO;  [facility];  Service: Endoscopy;;  colon HPI: Pt is a 60 y/o female admitted 7/1 with SOB and chest pain. Pt found to have Afib with RVR and asthma exacerbation. CXR 7/1 was negative. PMH: ASD s/p repair, paroxysmal atrial fibrillation s/p DCCV, insulin-dependent diabetes mellitus, PTSD, bipolar disorder, hypothyroidism, hyperlipidemia, OSA on CPAP,  essential hypertension, GERD, hypogammaglobulinemia, GERD  No data recorded Assessment / Plan / Recommendation CHL IP CLINICAL IMPRESSIONS 04/16/2021 Clinical Impression Pt was seen in radiology suite for modified barium swallow study. Trials of puree solids, regular texture solids, a 32mm barium tablet, and thin liquids via cup and straw were administered. Pt's oropharyngeal swallow mechanism was within normal limits. Coughing was noted throughout the study; however, no instances of penetration or aspiration were demonstrated and pt was able to adequately coordinate respiration and deglutition, and protect her airway despite constant coughing which often required sudden inspiration after boluses had already been propelled to the valleculae. Esophageal screening revealed stasis of barium in the mid and lower esophagus which improved slightly with a secondary swallow. This aligns with pt's c/o globus sensation and esophageal assessment (e.g., esophagram) is recommended to determine etiology of this. Dr. [place] was advised of results and recommendations. Pt reported that her discharge is planned for 7/9 and that she is amenable to completing an esophagram as an outpatient. Pt reported that she will likely continue to order some softer foods like meatloaf, but it was agreed that her current diet of regular texture solids and thin liquids will be continued to maximize her options. Further skilled SLP services are not clinically indicated at this time. SLP Visit Diagnosis Dysphagia, unspecified (R13.10) Attention and concentration deficit following -- Frontal lobe and executive function deficit following -- Impact on safety and function Mild aspiration risk   CHL IP TREATMENT RECOMMENDATION 04/16/2021 Treatment Recommendations No treatment recommended at this  time   Prognosis 04/16/2021 Prognosis for Safe Diet Advancement Good Barriers to Reach Goals -- Barriers/Prognosis Comment -- CHL IP DIET RECOMMENDATION 04/16/2021 SLP  Diet Recommendations Regular solids;Thin liquid Liquid Administration via Cup;Straw Medication Administration Whole meds with liquid Compensations Slow rate Postural Changes Remain semi-upright after after feeds/meals (Comment);Seated upright at 90 degrees   CHL IP OTHER RECOMMENDATIONS 04/16/2021 Recommended Consults Consider esophageal assessment Oral Care Recommendations Oral care BID Other Recommendations --   CHL IP FOLLOW UP RECOMMENDATIONS 04/16/2021 Follow up Recommendations None   No flowsheet data found.     CHL IP ORAL PHASE 04/16/2021 Oral Phase WFL Oral - Pudding Teaspoon -- Oral - Pudding Cup -- Oral - Honey Teaspoon -- Oral - Honey Cup -- Oral - Nectar Teaspoon -- Oral - Nectar Cup -- Oral - Nectar Straw -- Oral - Thin Teaspoon -- Oral - Thin Cup -- Oral - Thin Straw -- Oral - Puree -- Oral - Mech Soft -- Oral - Regular -- Oral - Multi-Consistency -- Oral - Pill -- Oral Phase - Comment --  CHL IP PHARYNGEAL PHASE 04/16/2021 Pharyngeal Phase WFL Pharyngeal- Pudding Teaspoon -- Pharyngeal -- Pharyngeal- Pudding Cup -- Pharyngeal -- Pharyngeal- Honey Teaspoon -- Pharyngeal -- Pharyngeal- Honey Cup -- Pharyngeal -- Pharyngeal- Nectar Teaspoon -- Pharyngeal -- Pharyngeal- Nectar Cup -- Pharyngeal -- Pharyngeal- Nectar Straw -- Pharyngeal -- Pharyngeal- Thin Teaspoon -- Pharyngeal -- Pharyngeal- Thin Cup -- Pharyngeal -- Pharyngeal- Thin Straw -- Pharyngeal -- Pharyngeal- Puree -- Pharyngeal -- Pharyngeal- Mechanical Soft -- Pharyngeal -- Pharyngeal- Regular -- Pharyngeal -- Pharyngeal- Multi-consistency -- Pharyngeal -- Pharyngeal- Pill -- Pharyngeal -- Pharyngeal Comment --  CHL IP CERVICAL ESOPHAGEAL PHASE 04/16/2021 Cervical Esophageal Phase Impaired Pudding Teaspoon -- Pudding Cup -- Honey Teaspoon -- Honey Cup -- Nectar Teaspoon -- Nectar Cup -- Nectar Straw -- Thin Teaspoon -- Thin Cup -- Thin Straw -- Puree (No Data) Mechanical Soft -- Regular -- Multi-consistency -- Pill -- Cervical Esophageal Comment --  Shanika I. Hardin Negus, [place], [place] Office number [phone] Pager [place] 04/16/2021, 3:30 PM                Subjective: Patient seen examined bedside, resting comfortably.  No further wheezing.  Ready for discharge home.  Discussed with patient needs outpatient follow-up with her gastroenterologist in [place] for her underlying dysphagia, speech therapy recommended outpatient esophagram.  Patient also will set up appointment with pulmonology in [place] after discharge.  No other questions or concerns at this time.  Denies headache, no fever/chills/night sweats, no nausea/vomiting/diarrhea, no chest pain, palpitations, no shortness of breath more than her normal baseline, no weakness, no fatigue, no paresthesias.  No acute events overnight per nursing staff.  Discharge Exam: Vitals:   04/17/21 0619 04/17/21 0751  BP: 126/67   Pulse:    Resp: 18 16  Temp: 98 F (36.7 C)   SpO2:     Vitals:   04/16/21 2055 04/16/21 2056 04/17/21 0619 04/17/21 0751  BP:   126/67   Pulse:      Resp:   18 16  Temp:   98 F (36.7 C)   TempSrc:   Oral   SpO2: 97% 97%    Weight:      Height:        General: Pt is alert, awake, not in acute distress Cardiovascular: RRR, S1/S2 +, no rubs, no gallops Respiratory: CTA bilaterally, no wheezing, no rhonchi, on room air Abdominal: Soft, NT, ND, bowel sounds +  Extremities: no edema, no cyanosis    The results of significant diagnostics from this hospitalization (including imaging, microbiology, ancillary and laboratory) are listed below for reference.     Microbiology: Recent Results (from the past 240 hour(s))  Resp Panel by RT-PCR (Flu A&B, Covid) Nasopharyngeal Swab     Status: None   Collection Time: 04/07/21  8:21 PM   Specimen: Nasopharyngeal Swab; Nasopharyngeal(NP) swabs in vial transport medium  Result Value Ref Range Status   SARS Coronavirus 2 by RT PCR NEGATIVE NEGATIVE Final     Comment: (NOTE) SARS-CoV-2 target nucleic acids are NOT DETECTED.  The SARS-CoV-2 RNA is generally detectable in upper respiratory specimens during the acute phase of infection. The lowest concentration of SARS-CoV-2 viral copies this assay can detect is 138 copies/mL. A negative result does not preclude SARS-Cov-2 infection and should not be used as the sole basis for treatment or other patient management decisions. A negative result may occur with  improper specimen collection/handling, submission of specimen other than nasopharyngeal swab, presence of viral mutation(s) within the areas targeted by this assay, and inadequate number of viral copies(<138 copies/mL). A negative result must be combined with clinical observations, patient history, and epidemiological information. The expected result is Negative.  Fact Sheet for Patients:  EntrepreneurPulse.com.au  Fact Sheet for Healthcare Providers:  IncredibleEmployment.be  This test is no t yet approved or cleared by the [place] FDA and  has been authorized for detection and/or diagnosis of SARS-CoV-2 by FDA under an Emergency Use Authorization (EUA). This EUA will remain  in effect (meaning this test can be used) for the duration of the COVID-19 declaration under Section 564(b)(1) of the Act, 21 U.S.C.section 360bbb-3(b)(1), unless the authorization is terminated  or revoked sooner.       Influenza A by PCR NEGATIVE NEGATIVE Final   Influenza B by PCR NEGATIVE NEGATIVE Final    Comment: (NOTE) The Xpert Xpress SARS-CoV-2/FLU/RSV plus assay is intended as an aid in the diagnosis of influenza from Nasopharyngeal swab specimens and should not be used as a sole basis for treatment. Nasal washings and aspirates are unacceptable for Xpert Xpress SARS-CoV-2/FLU/RSV testing.  Fact Sheet for Patients: EntrepreneurPulse.com.au  Fact Sheet for Healthcare  Providers: IncredibleEmployment.be  This test is not yet approved or cleared by the [place] FDA and has been authorized for detection and/or diagnosis of SARS-CoV-2 by FDA under an Emergency Use Authorization (EUA). This EUA will remain in effect (meaning this test can be used) for the duration of the COVID-19 declaration under Section 564(b)(1) of the Act, 21 U.S.C. section 360bbb-3(b)(1), unless the authorization is terminated or revoked.  Performed at [facility], [address] [place], [place] [address]   SARS CORONAVIRUS 2 (TAT 6-24 HRS) Nasopharyngeal Nasopharyngeal Swab     Status: None   Collection Time: 04/10/21 12:28 AM   Specimen: Nasopharyngeal Swab  Result Value Ref Range Status   SARS Coronavirus 2 NEGATIVE NEGATIVE Final    Comment: (NOTE) SARS-CoV-2 target nucleic acids are NOT DETECTED.  The SARS-CoV-2 RNA is generally detectable in upper and lower respiratory specimens during the acute phase of infection. Negative results do not preclude SARS-CoV-2 infection, do not rule out co-infections with other pathogens, and should not be used as the sole basis for treatment or other patient management decisions. Negative results must be combined with clinical observations, patient history, and epidemiological information. The expected result is Negative.  Fact Sheet for Patients: SugarRoll.be  Fact Sheet for Healthcare Providers: https://www.woods-mathews.com/  This test is not yet approved or cleared by the [place] and  has been authorized for detection and/or diagnosis of SARS-CoV-2 by FDA under an Emergency Use Authorization (EUA). This EUA will remain  in effect (meaning this test can be used) for the duration of the COVID-19 declaration under Se ction 564(b)(1) of the Act, 21 U.S.C. section 360bbb-3(b)(1), unless the authorization is terminated or revoked sooner.  Performed at [facility], [place] [address], [place], [place] [address]      Labs: BNP (last 3 results) Recent Labs    04/07/21 1907  BNP 20.9   Basic Metabolic Panel: Recent Labs  Lab 04/12/21 0131 04/16/21 0305  NA 132* 134*  K 4.2 3.5  CL 98 96*  CO2 23 27  GLUCOSE 313* 106*  BUN 30* 28*  CREATININE 1.00 0.78  CALCIUM 8.8* 9.0   Liver Function Tests: Recent Labs  Lab 04/12/21 0131  AST 14*  ALT 16  ALKPHOS 60  BILITOT 0.5  PROT 5.9*  ALBUMIN 3.1*   No results for input(s): LIPASE, AMYLASE in the last 168 hours. No results for input(s): AMMONIA in the last 168 hours. CBC: No results for input(s): WBC, NEUTROABS, HGB, HCT, MCV, PLT in the last 168 hours. Cardiac Enzymes: No results for input(s): CKTOTAL, CKMB, CKMBINDEX, TROPONINI in the last 168 hours. BNP: Invalid input(s): POCBNP CBG: Recent Labs  Lab 04/16/21 1606 04/16/21 1749 04/16/21 2134 04/17/21 0727 04/17/21 1215  GLUCAP 79 117* 360* 182* 70   D-Dimer No results for input(s): DDIMER in the last 72 hours. Hgb A1c No results for input(s): HGBA1C in the last 72 hours. Lipid Profile No results for input(s): CHOL, HDL, LDLCALC, TRIG, CHOLHDL, LDLDIRECT in the last 72 hours. Thyroid function studies No results for input(s): TSH, T4TOTAL, T3FREE, THYROIDAB in the last 72 hours.  Invalid input(s): FREET3 Anemia work up No results for input(s): VITAMINB12, FOLATE, FERRITIN, TIBC, IRON, RETICCTPCT in the last 72 hours. Urinalysis    Component Value Date/Time   COLORURINE COLORLESS (A) 12/08/2020 1315   APPEARANCEUR CLEAR 12/08/2020 1315   LABSPEC 1.006 12/08/2020 1315   PHURINE 5.0 12/08/2020 1315   GLUCOSEU NEGATIVE 12/08/2020 1315   HGBUR NEGATIVE 12/08/2020 1315   BILIRUBINUR NEGATIVE 12/08/2020 1315   KETONESUR NEGATIVE 12/08/2020 1315   PROTEINUR NEGATIVE 12/08/2020 1315   NITRITE NEGATIVE 12/08/2020 1315   LEUKOCYTESUR NEGATIVE 12/08/2020 1315   Sepsis Labs Invalid input(s): PROCALCITONIN,   WBC,  LACTICIDVEN Microbiology Recent Results (from the past 240 hour(s))  Resp Panel by RT-PCR (Flu A&B, Covid) Nasopharyngeal Swab     Status: None   Collection Time: 04/07/21  8:21 PM   Specimen: Nasopharyngeal Swab; Nasopharyngeal(NP) swabs in vial transport medium  Result Value Ref Range Status   SARS Coronavirus 2 by RT PCR NEGATIVE NEGATIVE Final    Comment: (NOTE) SARS-CoV-2 target nucleic acids are NOT DETECTED.  The SARS-CoV-2 RNA is generally detectable in upper respiratory specimens during the acute phase of infection. The lowest concentration of SARS-CoV-2 viral copies this assay can detect is 138 copies/mL. A negative result does not preclude SARS-Cov-2 infection and should not be used as the sole basis for treatment or other patient management decisions. A negative result may occur with  improper specimen collection/handling, submission of specimen other than nasopharyngeal swab, presence of viral mutation(s) within the areas targeted by this assay, and inadequate number of viral copies(<138 copies/mL). A negative result must be combined with clinical observations, patient history, and  epidemiological information. The expected result is Negative.  Fact Sheet for Patients:  EntrepreneurPulse.com.au  Fact Sheet for Healthcare Providers:  IncredibleEmployment.be  This test is no t yet approved or cleared by the [place] FDA and  has been authorized for detection and/or diagnosis of SARS-CoV-2 by FDA under an Emergency Use Authorization (EUA). This EUA will remain  in effect (meaning this test can be used) for the duration of the COVID-19 declaration under Section 564(b)(1) of the Act, 21 U.S.C.section 360bbb-3(b)(1), unless the authorization is terminated  or revoked sooner.       Influenza A by PCR NEGATIVE NEGATIVE Final   Influenza B by PCR NEGATIVE NEGATIVE Final    Comment: (NOTE) The Xpert Xpress SARS-CoV-2/FLU/RSV  plus assay is intended as an aid in the diagnosis of influenza from Nasopharyngeal swab specimens and should not be used as a sole basis for treatment. Nasal washings and aspirates are unacceptable for Xpert Xpress SARS-CoV-2/FLU/RSV testing.  Fact Sheet for Patients: EntrepreneurPulse.com.au  Fact Sheet for Healthcare Providers: IncredibleEmployment.be  This test is not yet approved or cleared by the [place] FDA and has been authorized for detection and/or diagnosis of SARS-CoV-2 by FDA under an Emergency Use Authorization (EUA). This EUA will remain in effect (meaning this test can be used) for the duration of the COVID-19 declaration under Section 564(b)(1) of the Act, 21 U.S.C. section 360bbb-3(b)(1), unless the authorization is terminated or revoked.  Performed at [facility], [address], [place], [place] [address]   SARS CORONAVIRUS 2 (TAT 6-24 HRS) Nasopharyngeal Nasopharyngeal Swab     Status: None   Collection Time: 04/10/21 12:28 AM   Specimen: Nasopharyngeal Swab  Result Value Ref Range Status   SARS Coronavirus 2 NEGATIVE NEGATIVE Final    Comment: (NOTE) SARS-CoV-2 target nucleic acids are NOT DETECTED.  The SARS-CoV-2 RNA is generally detectable in upper and lower respiratory specimens during the acute phase of infection. Negative results do not preclude SARS-CoV-2 infection, do not rule out co-infections with other pathogens, and should not be used as the sole basis for treatment or other patient management decisions. Negative results must be combined with clinical observations, patient history, and epidemiological information. The expected result is Negative.  Fact Sheet for Patients: SugarRoll.be  Fact Sheet for Healthcare Providers: https://www.woods-mathews.com/  This test is not yet approved or cleared by the [place] FDA and  has been authorized for detection  and/or diagnosis of SARS-CoV-2 by FDA under an Emergency Use Authorization (EUA). This EUA will remain  in effect (meaning this test can be used) for the duration of the COVID-19 declaration under Se ction 564(b)(1) of the Act, 21 U.S.C. section 360bbb-3(b)(1), unless the authorization is terminated or revoked sooner.  Performed at [facility], [place] [address] [place], [place] [address]      Time coordinating discharge: Over 30 minutes  SIGNED:   Emanuelle Hammerstrom J [place] DO  [facility] 04/17/2021, 12:33 PM

## 2021-04-19 NOTE — Telephone Encounter (Signed)
Lft msg for pt to call us back and let us know what insulin she is taking since being discharged from the hospital.

## 2021-04-19 NOTE — Telephone Encounter (Signed)
Pt is calling and requesting a call back, states she just got out the hospital and she is needing help getting her Laura [name] back up and running, states she does have one on her arm right now. Pt states her sugar has been in the 300s since they have her on prednisone. [phone]

## 2021-04-20 DIAGNOSIS — E785 Hyperlipidemia, unspecified: Secondary | ICD-10-CM | POA: Diagnosis not present

## 2021-04-20 DIAGNOSIS — F319 Bipolar disorder, unspecified: Secondary | ICD-10-CM | POA: Diagnosis not present

## 2021-04-20 DIAGNOSIS — J479 Bronchiectasis, uncomplicated: Secondary | ICD-10-CM | POA: Diagnosis not present

## 2021-04-20 DIAGNOSIS — D801 Nonfamilial hypogammaglobulinemia: Secondary | ICD-10-CM | POA: Diagnosis not present

## 2021-04-20 DIAGNOSIS — I480 Paroxysmal atrial fibrillation: Secondary | ICD-10-CM | POA: Diagnosis not present

## 2021-04-20 DIAGNOSIS — E1140 Type 2 diabetes mellitus with diabetic neuropathy, unspecified: Secondary | ICD-10-CM | POA: Diagnosis not present

## 2021-04-20 DIAGNOSIS — I509 Heart failure, unspecified: Secondary | ICD-10-CM | POA: Diagnosis not present

## 2021-04-20 DIAGNOSIS — I110 Hypertensive heart disease with heart failure: Secondary | ICD-10-CM | POA: Diagnosis not present

## 2021-04-20 DIAGNOSIS — E1169 Type 2 diabetes mellitus with other specified complication: Secondary | ICD-10-CM | POA: Diagnosis not present

## 2021-04-20 DIAGNOSIS — J4551 Severe persistent asthma with (acute) exacerbation: Secondary | ICD-10-CM | POA: Diagnosis not present

## 2021-04-20 NOTE — Telephone Encounter (Signed)
Lets increase her Lantus to 60 units nightly and adjust the Humalog to 14-20 units TID with meals if glucose is above 90 and she is eating.  She should not be taking any additional short acting insulin in between due to the risk of dropping her too low.  Continue Trulicity as prescribed.  She may need further adjustments but we need to move slowly to prevent hypoglycemia.

## 2021-04-20 NOTE — Telephone Encounter (Signed)
Printed Libre report. Pt states her reading today were too high to read. Since being in the hospital she is taking Lantus 50 units qhs, Trulicity, and she is now taking Humalog that she already had at home. She states she is taking 6-20 units TIDAC but she also admits to taking Humalog anytime her BG is high. She took 20 units last night with the lantus because her reading was high.

## 2021-04-20 NOTE — Telephone Encounter (Signed)
Pt notified and given sliding scale.

## 2021-04-22 ENCOUNTER — Ambulatory Visit: Payer: Medicaid Other | Admitting: Family

## 2021-04-22 ENCOUNTER — Ambulatory Visit (INDEPENDENT_AMBULATORY_CARE_PROVIDER_SITE_OTHER): Payer: Medicare Other | Admitting: Cardiology

## 2021-04-22 ENCOUNTER — Other Ambulatory Visit: Payer: Self-pay

## 2021-04-22 ENCOUNTER — Encounter: Payer: Self-pay | Admitting: Cardiology

## 2021-04-22 VITALS — BP 122/66 | HR 82 | Ht 64.0 in | Wt 205.0 lb

## 2021-04-22 DIAGNOSIS — R600 Localized edema: Secondary | ICD-10-CM | POA: Diagnosis not present

## 2021-04-22 DIAGNOSIS — I480 Paroxysmal atrial fibrillation: Secondary | ICD-10-CM

## 2021-04-22 DIAGNOSIS — R0602 Shortness of breath: Secondary | ICD-10-CM | POA: Diagnosis not present

## 2021-04-22 MED ORDER — TORSEMIDE 20 MG PO TABS: ORAL_TABLET | ORAL | 3 refills | Status: DC

## 2021-04-22 MED ORDER — BISOPROLOL FUMARATE 5 MG PO TABS: 2.5000 mg | ORAL_TABLET | Freq: Every day | ORAL | 3 refills | Status: DC

## 2021-04-22 NOTE — Patient Instructions (Signed)
Medication Instructions:  Your physician has recommended you make the following change in your medication:  START Bisoprolol 2.5 mg tablets daily  *If you need a refill on your cardiac medications before your next appointment, please call your pharmacy*   Lab Work: None If you have labs (blood work) drawn today and your tests are completely normal, you will receive your results only by: [place] (if you have MyChart) OR A paper copy in the mail If you have any lab test that is abnormal or we need to change your treatment, we will call you to review the results.   Testing/Procedures: None   Follow-Up: At [facility], you and your health needs are our priority.  As part of our continuing mission to provide you with exceptional heart care, we have created designated Provider Care Teams.  These Care Teams include your primary Cardiologist (physician) and Advanced Practice Providers (APPs -  Physician Assistants and Nurse Practitioners) who all work together to provide you with the care you need, when you need it.  We recommend signing up for the patient portal called "MyChart".  Sign up information is provided on this After Visit Summary.  MyChart is used to connect with patients for Virtual Visits (Telemedicine).  Patients are able to view lab/test results, encounter notes, upcoming appointments, etc.  Non-urgent messages can be sent to your provider as well.   To learn more about what you can do with MyChart, go to [email or web address].    Your next appointment:   3 month(s)  The format for your next appointment:   In Person  Provider:   Carlyle Dolly, MD   Other Instructions You have been referred to [facility]. They will contact you for your first appointment.

## 2021-04-22 NOTE — Progress Notes (Signed)
Clinical Summary Laura [name] is a 60 y.o.female seen today for follow up of the following medical problems.    1. LE edema/Chronic diastolic HF - 08/2019 echo LVEF 60-65%, elevated LVEDP, mild RV dysfunction, mild to mod LAE, - has been on torsemide 60mg  daily   - working on weight loss, dieting. Down to 228 lbs. Mainly drinks a healthy drink mix she created on her own.  - some swelling at times, controlled with torsemide overall.     11/2020 echo LVEF 65-70%, indet diastolic, low normal RV function - some recent LE edema. She is on torsemide 20mg  daily.  - home weights usually 195 lbs which is her baseline    2. PAF - some palpitations at times.  - compliant with meds    - admit 04/17/2021 with afib with RVR in setting of asthma exacerbation - started on dilt gtt, transitioned to oral.  - ongoing palpitations     3. Prior ASD repair   4. Asthma/Bronchitis - recent admission 04/2021 - currently on prednisone  5. COVID + in Jan 2022     Social history: She moved to 05/2021 in 2019 from [place], 2020, where she had lived with her daughter for 3 years.  She is originally from [facility].  She has a daughter who lives in [place]), a daughter who lives in [place], and a daughter who lives in [place].  She is currently living with her sister. She is widowed.  She was married for 30 years.  Her husband died of bilateral pulmonary embolism, he also had lung cancer and a brain tumor and a history of non-Hodgkin's lymphoma.   Recent trips [place] to visit her daughter.   Past Medical History:  Diagnosis Date   Asthma    Atrial fibrillation (HCC)    Bipolar disorder (HCC)    Bronchiectasis (HCC)    Carpal tunnel syndrome of right wrist    CHF (congestive heart failure) (HCC)    Complication of anesthesia    "my Dr in [place] told me I had an allergic reaction to the combination of the pain meds and the anesthesia" she does not remember what  happened but "I eneded up in the ICU for 5 days".   H/O congenital atrial septal defect (ASD) repair    Hypogammaglobulinemia (HCC)    Hypothyroid    IgE deficiency (HCC)    Neuropathy    Neuropathy    Osteoarthritis    Pinched nerve in neck    PTSD (post-traumatic stress disorder)    Recurrent sinusitis    Short-term memory loss    Sleep apnea    TIA (transient ischemic attack)    Tremors of nervous system    seeing PCP for this.   Type 2 diabetes mellitus (HCC)    Urticaria    Wound infection      Allergies  Allergen Reactions   Ativan [Lorazepam] Hives   Phenergan [Promethazine Hcl] Hives     Current Outpatient Medications  Medication Sig Dispense Refill   acetaminophen (TYLENOL) 325 MG tablet Take 2 tablets (650 mg total) by mouth every 6 (six) hours as needed for mild pain, fever or headache. 30 tablet 1   albuterol (PROVENTIL) (2.5 MG/3ML) 0.083% nebulizer solution Take 3 mLs (2.5 mg total) by nebulization every 4 (four) hours as needed for wheezing or shortness of breath. 75 mL 1   albuterol (VENTOLIN HFA) 108 (90 Base) MCG/ACT inhaler INHALE 2 PUFFS INTO THE  LUNGS EVERY 6 HOURS AS NEEDED FOR WHEEZING OR SHORTNESS OF BREATH 18 g 1   apixaban (ELIQUIS) 5 MG TABS tablet Take 5 mg by mouth 2 (two) times daily.     atorvastatin (LIPITOR) 10 MG tablet Take 10 mg by mouth every evening.      BD PEN NEEDLE NANO 2ND GEN 32G X 4 MM MISC 1 each by Other route in the morning, at noon, in the evening, and at bedtime.     blood glucose meter kit and supplies 1 each by Other route 4 (four) times daily. Dispense based on patient and insurance preference. Use up to four times daily as directed. (FOR ICD-10 E10.9, E11.9). 1 each 0   Cholecalciferol (VITAMIN D3) 125 MCG (5000 UT) CAPS Take 1 capsule (5,000 Units total) by mouth daily. 90 capsule 0   Continuous Blood Gluc Receiver (FREESTYLE LIBRE 2 READER) DEVI As directed 1 each 0   Continuous Blood Gluc Sensor (FREESTYLE LIBRE 2  SENSOR) MISC 1 Piece by Does not apply route every 14 (fourteen) days. 2 each 3   diltiazem (CARDIZEM CD) 360 MG 24 hr capsule TAKE 1 CAPSULE(360 MG) BY MOUTH DAILY 90 capsule 3   divalproex (DEPAKOTE ER) 500 MG 24 hr tablet 500 mg in AM, 1000 mg at night 270 tablet 0   ELDERBERRY PO Take 4 g by mouth daily. Gummies 2g each     EPINEPHrine 0.3 mg/0.3 mL IJ SOAJ injection Inject 0.3 mg into the muscle once as needed for anaphylaxis.     FASENRA PEN 30 MG/ML SOAJ INJECT $RemoveBef'30MG'rkCVqjpoXP$  SUBCUTANEOUSLY  EVERY 8 WEEKS (GIVEN AT MD  OFFICE) 1 mL 6   ferrous sulfate 324 MG TBEC Take 324 mg by mouth daily with breakfast.     fluticasone (FLOVENT HFA) 220 MCG/ACT inhaler 2 puffs twice daily with spacer2 puffs twice daily with spacer 1 each 1   gabapentin (NEURONTIN) 300 MG capsule Take 300 mg by mouth 3 (three) times daily.      hydrALAZINE (APRESOLINE) 25 MG tablet Take 25 mg by mouth 3 (three) times daily.     hydrOXYzine (ATARAX/VISTARIL) 25 MG tablet Take 1 tablet (25 mg total) by mouth every 6 (six) hours as needed for anxiety or nausea. 30 tablet 0   Immune Globulin, Human, 4 GM/20ML SOLN Inject 100 mLs into the skin every 14 (fourteen) days.     insulin lispro (HUMALOG KWIKPEN) 200 UNIT/ML KwikPen Inject 2-10 Units into the skin 3 (three) times daily before meals. Glucose 151-200 take 2 units, glucose 201-250 take 4 units, glucose 251-300 take 6 units, glucose 301-350 take 8 units, glucose 251-400 take 10 units 6 mL 2   Insulin Pen Needle (PEN NEEDLES 3/16") 31G X 5 MM MISC Use as directed with insulin pen 100 each 0   LANTUS SOLOSTAR 100 UNIT/ML Solostar Pen Inject 40 Units into the skin at bedtime.     levothyroxine (SYNTHROID, LEVOTHROID) 112 MCG tablet Take 112 mcg by mouth daily before breakfast.     Magnesium Oxide 400 MG CAPS Take 1 capsule (400 mg total) by mouth daily. 90 capsule 3   Melatonin 3 MG TABS Take 3 mg by mouth at bedtime.     metoCLOPramide (REGLAN) 5 MG tablet Take 5 mg by mouth 3 (three)  times daily.     montelukast (SINGULAIR) 10 MG tablet Take 1 tablet by mouth daily.     Multiple Vitamins-Minerals (MULTIVITAMIN WITH MINERALS) tablet Take 1 tablet by mouth daily. Woman  Nebulizer MISC Nebulizer tubing kit 2 each 5   omeprazole (PRILOSEC) 20 MG capsule Take 1 capsule (20 mg total) by mouth 2 (two) times daily before a meal. 60 capsule 5   ondansetron (ZOFRAN) 4 MG tablet Take 1 tablet (4 mg total) by mouth every 8 (eight) hours as needed for nausea or vomiting. 20 tablet 0   ONETOUCH ULTRA test strip 1 each by Other route in the morning, at noon, in the evening, and at bedtime.     oxyCODONE-acetaminophen (PERCOCET) 5-325 MG tablet Take 1 tablet by mouth every 6 (six) hours as needed for up to 5 days for severe pain. 20 tablet 0   polyethylene glycol (MIRALAX / GLYCOLAX) 17 g packet Take 17 g by mouth daily as needed. 14 each 0   Potassium Chloride ER 20 MEQ TBCR Take 20 mEq by mouth daily.     predniSONE (DELTASONE) 10 MG tablet Take 2 tablets (20 mg total) by mouth daily for 2 days, THEN 1 tablet (10 mg total) daily for 2 days, THEN 0.5 tablets (5 mg total) daily for 2 days. 7 tablet 0   Tiotropium Bromide Monohydrate (SPIRIVA RESPIMAT) 1.25 MCG/ACT AERS Inhale 2 puffs into the lungs daily. 4 g 1   topiramate (TOPAMAX) 100 MG tablet Take 1 tablet (100 mg total) by mouth 2 (two) times daily. 180 tablet 1   torsemide (DEMADEX) 20 MG tablet Take 2 tablets (40 mg total) by mouth daily. 180 tablet 3   triamcinolone cream (KENALOG) 0.1 % Apply 1 application topically 2 (two) times daily.     TRULICITY 8.37 GB/0.2XJ SOPN ADMINISTER 0.75 MG UNDER THE SKIN 1 TIME A WEEK 2 mL 2   vitamin B-12 (CYANOCOBALAMIN) 1000 MCG tablet Take 1,000 mcg by mouth daily.     vitamin C (ASCORBIC ACID) 500 MG tablet Take 500 mg by mouth daily. Power C immune support     Current Facility-Administered Medications  Medication Dose Route Frequency Provider Last Rate Last Admin   Benralizumab SOSY 30 mg   30 mg Subcutaneous Q28 days Valentina Shaggy, MD   30 mg at 07/03/20 1017     Past Surgical History:  Procedure Laterality Date   ASD REPAIR  1968   BIOPSY  01/26/2021   Procedure: BIOPSY;  Surgeon: [name], DO;  Location: [facility];  Service: Endoscopy;;  gastric   CARDIAC SURGERY     CARDIOVERSION     CARDIOVERSION N/A 08/29/2018   Procedure: CARDIOVERSION;  Surgeon: Arnoldo Lenis, MD;  Location: [facility];  Service: Endoscopy;  Laterality: N/A;   [place]   COLONOSCOPY WITH PROPOFOL N/A 01/26/2021   Procedure: COLONOSCOPY WITH PROPOFOL;  Surgeon: [name], DO;  [facility];  Service: Endoscopy;  Laterality: N/A;  am appt, diabetic   ESOPHAGOGASTRODUODENOSCOPY (EGD) WITH PROPOFOL N/A 01/26/2021   Procedure: ESOPHAGOGASTRODUODENOSCOPY (EGD) WITH PROPOFOL;  Surgeon: [name], DO;  [facility];  Service: Endoscopy;  Laterality: N/A;   LEFT HEART CATH AND CORONARY ANGIOGRAPHY N/A 08/30/2019   Procedure: LEFT HEART CATH AND CORONARY ANGIOGRAPHY;  Surgeon: [name], MD;  Location: [facility];  Service: Cardiovascular;  Laterality: N/A;   NASAL SINUS SURGERY  2017   POLYPECTOMY  01/26/2021   Procedure: POLYPECTOMY;  Surgeon: [name], DO;  [facility];  Service: Endoscopy;;  colon     Allergies  Allergen Reactions   Ativan [Lorazepam]  Hives   Phenergan [Promethazine Hcl] Hives      Family History  Problem Relation Age of Onset   Hypertension Mother    Stroke Mother    Kidney disease Mother    Heart attack Mother    Alcohol abuse Mother    Other Father        ?colon cancer   Kidney disease Sister    Heart disease Brother    Stroke Maternal Aunt    Heart disease Maternal Aunt    Hypertension Sister    Bipolar disorder Sister    Thyroid disease Sister    Prostate cancer Brother    Thyroid disease Daughter    Hashimoto's thyroiditis Daughter     Celiac disease Daughter    Allergic rhinitis Daughter    Asthma Neg Hx      Social History Ms. Guest reports that she quit smoking about 14 years ago. Her smoking use included cigarettes. She started smoking about 42 years ago. She has a 28.00 pack-year smoking history. She has never used smokeless tobacco. Ms. Klumpp reports previous alcohol use.   Review of Systems CONSTITUTIONAL: No weight loss, fever, chills, weakness or fatigue.  HEENT: Eyes: No visual loss, blurred vision, double vision or yellow sclerae.No hearing loss, sneezing, congestion, runny nose or sore throat.  SKIN: No rash or itching.  CARDIOVASCULAR: per hpi RESPIRATORY: No shortness of breath, cough or sputum.  GASTROINTESTINAL: No anorexia, nausea, vomiting or diarrhea. No abdominal pain or blood.  GENITOURINARY: No burning on urination, no polyuria NEUROLOGICAL: No headache, dizziness, syncope, paralysis, ataxia, numbness or tingling in the extremities. No change in bowel or bladder control.  MUSCULOSKELETAL: No muscle, back pain, joint pain or stiffness.  LYMPHATICS: No enlarged nodes. No history of splenectomy.  PSYCHIATRIC: No history of depression or anxiety.  ENDOCRINOLOGIC: No reports of sweating, cold or heat intolerance. No polyuria or polydipsia.  Marland Kitchen   Physical Examination Today's Vitals   04/22/21 0837  BP: 122/66  Pulse: 82  SpO2: 98%  Weight: 205 lb (93 kg)  Height: $Remove'5\' 4"'tsxhIYl$  (1.626 m)   Body mass index is 35.19 kg/m.   Gen: resting comfortably, no acute distress HEENT: no scleral icterus, pupils equal round and reactive, no palptable cervical adenopathy,  CV: irreg, no m/r/g Resp: Clear to auscultation bilaterally GI: abdomen is soft, non-tender, non-distended, normal bowel sounds, no hepatosplenomegaly MSK: extremities are warm, trace bilateral edema Skin: warm, no rash Neuro:  no focal deficits Psych: appropriate affect   Diagnostic Studies 08/2019 cath LV end diastolic  pressure is mildly elevated. There is no aortic valve stenosis. Aortic atherosclerosis/calcification noted. No angiographically apparently CAD.     08/2019 echo 1. Left ventricular ejection fraction, by visual estimation, is 60 to  65%. The left ventricle has normal function. There is mildly increased  left ventricular hypertrophy.   2. Elevated left ventricular end-diastolic pressure.   3. Left ventricular diastolic function could not be evaluated.   4. Global right ventricle has mildly reduced systolic function.The right  ventricular size is normal. No increase in right ventricular wall  thickness.   5. Left atrial size was mild-moderately dilated.   6. Right atrial size was normal.   7. Moderate to severe mitral annular calcification.   8. The mitral valve is degenerative. Trace mitral valve regurgitation.   9. The tricuspid valve is grossly normal. Tricuspid valve regurgitation  is trivial.  10. The aortic valve is tricuspid. Aortic valve regurgitation is not  visualized. Mild aortic valve  sclerosis without stenosis.  11. The pulmonic valve was grossly normal. Pulmonic valve regurgitation is  not visualized.  12. The inferior vena cava is normal in size with greater than 50%  respiratory variability, suggesting right atrial pressure of 3 mmHg.    Assessment and Plan  1. LE edema/Chronic diatolic HF - some recent edema, she will take her torsemide $RemoveBefor'20mg'ZJclVlkEPpfZ$  daily, may take additional $RemoveBeforeD'20mg'oUXSskEyxnFTVm$  prn.    2. PAF - increased palpitations in setting of ongoing pulmonary issues, recent steroid use and bronchodilators - on high dose dilatiazem, will add bisoprolol 2.$RemoveBeforeDE'5mg'emepFujnJxEJwkK$  daily, hopefully will tolerate cardiospecific beta blocker despite her lung disease     Arnoldo Lenis, M.D.

## 2021-04-23 ENCOUNTER — Encounter: Payer: Self-pay | Admitting: Allergy & Immunology

## 2021-04-23 ENCOUNTER — Ambulatory Visit (INDEPENDENT_AMBULATORY_CARE_PROVIDER_SITE_OTHER): Payer: Medicare Other | Admitting: Allergy & Immunology

## 2021-04-23 VITALS — BP 124/76 | HR 66 | Temp 98.4°F | Resp 16 | Ht 64.0 in | Wt 204.0 lb

## 2021-04-23 DIAGNOSIS — J4551 Severe persistent asthma with (acute) exacerbation: Secondary | ICD-10-CM | POA: Diagnosis not present

## 2021-04-23 DIAGNOSIS — D839 Common variable immunodeficiency, unspecified: Secondary | ICD-10-CM | POA: Diagnosis not present

## 2021-04-23 DIAGNOSIS — J45909 Unspecified asthma, uncomplicated: Secondary | ICD-10-CM | POA: Diagnosis not present

## 2021-04-23 DIAGNOSIS — J4550 Severe persistent asthma, uncomplicated: Secondary | ICD-10-CM

## 2021-04-23 DIAGNOSIS — I480 Paroxysmal atrial fibrillation: Secondary | ICD-10-CM | POA: Diagnosis not present

## 2021-04-23 DIAGNOSIS — J310 Chronic rhinitis: Secondary | ICD-10-CM | POA: Diagnosis not present

## 2021-04-23 NOTE — Progress Notes (Signed)
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 Hypogammaglobulinemia - Continue with Cuvitru.  - We are in the process of increasing to 25 g every 2 weeks.  2. Chronic rhinitis   - Continue with nasal saline rinses as needed.   3. Severe persistent asthma, uncomplicated - Lung function looks stable today. - We are going to continue with the same medication regimen for now.  - Daily controller medication(s): Breztri two puffs twice daily with a spacer + Fasenra every 8 weeks - Prior to physical activity: ProAir 2 puffs 10-15 minutes before physical activity. - Rescue medications: ProAir 4 puffs every 4-6 hours as needed, albuterol nebulizer one vial every 4-6 hours as needed or DuoNeb nebulizer one vial every 4-6 hours as needed - Asthma control goals:  * Full participation in all desired activities (may need albuterol before activity) * Albuterol use two time or less a week on average (not counting use with activity) * Cough interfering with sleep two time or less a month * Oral steroids no more than once a year * No hospitalizations.  4. Return in about 3 months (around 07/24/2021).    Subjective:   Kahliya Fraleigh is a 60 y.o. female presenting today for follow up of  Chief Complaint  Patient presents with   Asthma    ACT -10 Just got out of the hospital - went in two weeks around his last visit. Tightness in her chest shortness of breath was not able to talk. They did a breathing treatment, prednisone , and a steroid shot     Rakeisha Nyce has a history of the following: Patient Active Problem List   Diagnosis Date Noted   Abdominal pain 04/27/2021   Urinary incontinence  04/27/2021   Dysphagia 04/27/2021   Constipation 04/27/2021   Mixed 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 ([place]) 04/10/2021   GERD without esophagitis 04/10/2021   Adnexal cyst 01/19/2021   Chronic RLQ pain 01/19/2021   Mixed stress and urge urinary incontinence 01/19/2021   Other fatigue 12/10/2020   Abdominal pain, chronic, right lower quadrant 12/08/2020   Abnormal weight loss 12/08/2020   Poor appetite 12/08/2020   Abnormal EKG    Chronic diastolic (congestive) heart failure ([place]) 08/29/2019   Shortness of breath 08/29/2019   Musculoskeletal chest pain 08/28/2019   Bipolar disorder ([place]) 08/10/2018   Headache 08/10/2018   H/O atrial flutter 08/10/2018   OSA on CPAP 08/10/2018   Anxiety 08/10/2018   Uncontrolled type 2 diabetes mellitus with hyperglycemia ([place]) 03/13/2018   Hypothyroidism 03/13/2018   Essential hypertension, benign 03/13/2018   Mixed hyperlipidemia 03/13/2018   Hypogammaglobulinemia ([place]) 02/06/2018   Non-allergic rhinitis 02/06/2018   Severe persistent asthma, uncomplicated 01/41/0301   Pulmonary nodules 02/06/2018   Bronchiectasis without complication ([place]) 31/43/8887   Type 2 diabetes mellitus with hyperglycemia, with long-term current use of insulin ([place]) 02/06/2018    History obtained from: chart review and patient.  Britanny is a 60 y.o. female presenting for a follow up visit.  She was last seen in June 2022.  At that time, we continued with q. vitreal but increased to 25 g every 2 weeks because she was having so many breakthrough infections.  For  her rhinitis, would continue with her nasal saline.  She was having a difficult time breathing.  We continued her on the azithromycin and started Augmentin 875 mg twice daily for 2 weeks in combination with prednisone.  We continued the Breztri 2 puffs twice daily as well as Fasenra every 8 weeks.  Shortly After the visit, she ended up going into the hospital due to shortness of breath.   She was in the ER on June 29 as well as July 1.  On July 1, she was admitted to [facility] instead of [facility].  Today, she is very tearful about her discussion of what happened in the hospital.  She presented with atrial fibrillation.  Her heart rate was greater than 140 in the emergency room.  She was having dyspnea with coughing.  Chest x-ray was normal.  She was placed on oxygen supplementation as well as IV steroids in combination with Pulmicort and Brovana nebulized treatments twice daily.  She had an elevated troponin, but an EKG was normal.  Since the hospitalization, she has remained very weak. She is fine as long as she is in a room, but going outside, she has balancing issues. She tells me that she is having a case manager come over this afternoon to do physical therapy for hlep with improving her endurance. This was thought to be related to her COVID infection and her norovirus.   She was having trouble walking across the parking lot. She was discharged and then readmitted for continued symptoms. She got Patient Advocate involved. She was admitted back to [facility]. She ended up getting admitted to [facility]. She was heading to [facility] but then her heart was going into atrial fibrillation and there was concern that she needed more intense intervention.   She met with Dr. Harl Bowie yesterday. She is in the first "stages" of atrial fibrillation. She is going into the 2nd steps where they are transitioning to the other steps. She has never had an ablation.  She also mentions several times coming to the realization that she has heart failure.  I reminded her that she has had this diagnosis since I met her.  I also reminded her that this could all be managed medically and is not necessarily the best symptoms.  She remains on all her asthma medications.  She has not started the increased dose of Cuvitru.  This is all been approved from what I can tell.  Otherwise,  there have been no changes to her past medical history, surgical history, family history, or social history.    Review of Systems  Constitutional: Negative.  Negative for chills, fever, malaise/fatigue and weight loss.  HENT:  Positive for congestion and sinus pain. Negative for ear discharge and ear pain.   Eyes:  Negative for pain, discharge and redness.  Respiratory:  Positive for cough and sputum production. Negative for shortness of breath and wheezing.   Cardiovascular: Negative.  Negative for chest pain and palpitations.  Gastrointestinal:  Negative for abdominal pain, constipation, diarrhea, heartburn, nausea and vomiting.  Skin: Negative.  Negative for itching and rash.  Neurological:  Negative for dizziness and headaches.  Endo/Heme/Allergies:  Negative for environmental allergies. Does not bruise/bleed easily.      Objective:   Blood pressure 124/76, pulse 66, temperature 98.4 F (36.9 C), resp. rate 16, height $RemoveBe'5\' 4"'ZppSOuCKW$  (1.626 m), weight 204 lb (92.5 kg), SpO2 97 %. Body mass index is 35.02 kg/m.   Physical Exam:  Physical Exam Constitutional:      Appearance: She is well-developed.     Comments: Tearful.  Weak Kegel.  Using a walker.  HENT:     Head: Normocephalic and atraumatic.     Right Ear: Tympanic membrane, ear canal and external ear normal.     Left Ear: Tympanic membrane, ear canal and external ear normal.     Nose: No nasal deformity, septal deviation, mucosal edema or rhinorrhea.     Right Turbinates: Enlarged and swollen.     Left Turbinates: Enlarged and swollen.     Right Sinus: No maxillary sinus tenderness or frontal sinus tenderness.     Left Sinus: No maxillary sinus tenderness or frontal sinus tenderness.     Mouth/Throat:     Mouth: Mucous membranes are not pale and not dry.     Pharynx: Uvula midline.  Eyes:     General: Lids are normal. No allergic shiner.       Right eye: No discharge.        Left eye: No discharge.     Conjunctiva/sclera:  Conjunctivae normal.     Right eye: Right conjunctiva is not injected. No chemosis.    Left eye: Left conjunctiva is not injected. No chemosis.    Pupils: Pupils are equal, round, and reactive to light.  Cardiovascular:     Rate and Rhythm: Normal rate and regular rhythm.     Heart sounds: Normal heart sounds.  Pulmonary:     Effort: Pulmonary effort is normal. Tachypnea present. No accessory muscle usage or respiratory distress.     Breath sounds: Normal breath sounds. No wheezing, rhonchi or rales.     Comments: Moving air well in all lung fields.  No increased work of breathing. Chest:     Chest wall: No tenderness.  Lymphadenopathy:     Cervical: No cervical adenopathy.  Skin:    Coloration: Skin is not pale.     Findings: No abrasion, erythema, petechiae or rash. Rash is not papular, urticarial or vesicular.  Neurological:     Mental Status: She is alert.  Psychiatric:        Behavior: Behavior is cooperative.     Diagnostic studies:    Spirometry: results abnormal (FEV1: 1.60/64%, FVC: 2.28/71%, FEV1/FVC: 70%).    Spirometry consistent with possible restrictive disease. Overall, values are stable.   Allergy Studies: none         Salvatore Marvel, MD  [facility]

## 2021-04-23 NOTE — Patient Instructions (Addendum)
1. Hypogammaglobulinemia - Continue with Cuvitru.  - We are in the process of increasing to 25 g every 2 weeks.  2. Chronic rhinitis   - Continue with nasal saline rinses as needed.   3. Severe persistent asthma, uncomplicated - Lung function looks stable today. - We are going to continue with the same medication regimen for now.  - Daily controller medication(s): Breztri two puffs twice daily with a spacer + Fasenra every 8 weeks - Prior to physical activity: ProAir 2 puffs 10-15 minutes before physical activity. - Rescue medications: ProAir 4 puffs every 4-6 hours as needed, albuterol nebulizer one vial every 4-6 hours as needed or DuoNeb nebulizer one vial every 4-6 hours as needed - Asthma control goals:  * Full participation in all desired activities (may need albuterol before activity) * Albuterol use two time or less a week on average (not counting use with activity) * Cough interfering with sleep two time or less a month * Oral steroids no more than once a year * No hospitalizations.  4. Return in about 3 months (around 07/24/2021).    Please inform us of any Emergency Department visits, hospitalizations, or changes in symptoms. Call us before going to the ED for breathing or allergy symptoms since we might be able to fit you in for a sick visit. Feel free to contact us anytime with any questions, problems, or concerns.  It was a pleasure to see you again today!  Websites that have reliable patient information: 1. American Academy of Asthma, Allergy, and Immunology: www.aaaai.org 2. Food Allergy Research and Education (FARE): foodallergy.org 3. Mothers of Asthmatics: http://www.asthmacommunitynetwork.org 4. American College of Allergy, Asthma, and Immunology: www.acaai.org   COVID-19 Vaccine Information can be found at: ShippingScam.co.uk For questions related to vaccine distribution or appointments, please email  [email or web address] or call [phone].     "Like" [place] on Facebook and Instagram for our latest updates!       Make sure you are registered to vote! If you have moved or changed any of your contact information, you will need to get this updated before voting!  In some cases, you MAY be able to register to vote online: [email or web address]

## 2021-04-26 ENCOUNTER — Ambulatory Visit (INDEPENDENT_AMBULATORY_CARE_PROVIDER_SITE_OTHER): Payer: Medicare Other | Admitting: Clinical

## 2021-04-26 ENCOUNTER — Other Ambulatory Visit: Payer: Self-pay

## 2021-04-26 DIAGNOSIS — F4310 Post-traumatic stress disorder, unspecified: Secondary | ICD-10-CM | POA: Diagnosis not present

## 2021-04-26 NOTE — Progress Notes (Signed)
Primary Care Physician: [name], MD  Primary Gastroenterologist:  Elon Alas. Abbey Chatters, DO   Chief Complaint  Patient presents with   Abdominal Pain    Still having a lot of pain, reports taking omep 3 times a day   Constipation    Relates to pain meds she is on miralax as needed    HPI: Laura [name] is a 60 y.o. female here for follow. Seen in 12/2020 for diminished appetite, weight loss.  She was also complaining of right lower quadrant pain, alternating constipation and diarrhea, nausea.    CT abdomen pelvis March 2022.  She was noted to have fluid and gas within the endometrial canal and vagina.  Left adnexa normal.  2 cm right adnexal cyst.  Diverticulosis.  Reviewed CT with Dr. Abigail Miyamoto (radiologist).  He reported gas can be normal, physiological within the endometrial canal/vagina.  He looked for possible fistula tract between the bladder/vagina given patient's profound urinary incontinence but he did not see any obvious findings.  He advised cystogram versus seeing GYN.  We have requested she see gynecologist for right adnexal cyst, urinary incontinence/CT findings of fluid/gas in the endometrial canal/vagina.  Seen by gyn. Pelvic ultrasound with transvaginal exam completed April 2022 there were unable to visualize cystic lesion seen within the right ovary on CT.  Endometrium slightly thickened at 8 mm.  Given patient asymptomatic, gynecologist recommended 74-monthfollow-up ultrasound.    Patient in the hospital earlier this month presenting via EMS for shortness of breath and chest pain.  Complained of productive cough.  Noted to be in A. fib with RVR when EMS arrived.  Patient felt to have severe persistent asthma with acute exacerbation. Complaint of coughing episodes and some difficulty swallowing her pills.  Speech therapy evaluated with modified barium swallow study and revealed stasis of barium in the mid/lower esophagus with slight improvement with secondary  swallow.  It was recommended follow-up outpatient and esophagram.  Of note patient had recent EGD April 2022.    Patient reports being screened for celiac disease previously because of her daughter's history.  EGD 01/2021: Gastritis-reactive gastropathy  Colonoscopy 01/2021: Non-bleeding internal hemorrhoids. Diverticulosis Two 5-8 mm ascending colon polyps -path tubular adenomas Next colonoscopy in five years  Today: Patient states she is still having significant urinary incontinence. She reports that she "dribbles" all over her house. Leaves a stream every where she goes. Worse when she first gets up out of bed. States she has not started using protective garments but feels like she needs to now. At home she urinates frequently to try and prevent incontinence.  She states she does get urgency and sometimes just cannot get to the bathroom in time.  Denies dysuria.  Has not seen urology.  GYN recommended urology follow-up.  Complains of dysphagia to solids and pills.  Also has globus sensation from swallowing saliva.  A lot of belching.  Denies heartburn.  Taking omeprazole 20 mg 3 times daily, added third dose on her own.  At some point she was started on Reglan, possibly in the ED but states PCP may have provided as well.  Complains of ongoing lower abdominal discomfort.  Vague description.  Denies cramps or significant pain.  Worse with meals.  Worse after BMs.  States her BMs have been more regular since she has been off the medication.  Still feels like he could go better.   Current Outpatient Medications  Medication Sig Dispense Refill   acetaminophen (TYLENOL)  325 MG tablet Take 2 tablets (650 mg total) by mouth every 6 (six) hours as needed for mild pain, fever or headache. 30 tablet 1   albuterol (PROVENTIL) (2.5 MG/3ML) 0.083% nebulizer solution Take 3 mLs (2.5 mg total) by nebulization every 4 (four) hours as needed for wheezing or shortness of breath. 75 mL 1   albuterol (VENTOLIN  HFA) 108 (90 Base) MCG/ACT inhaler INHALE 2 PUFFS INTO THE LUNGS EVERY 6 HOURS AS NEEDED FOR WHEEZING OR SHORTNESS OF BREATH 18 g 1   apixaban (ELIQUIS) 5 MG TABS tablet Take 5 mg by mouth 2 (two) times daily.     atorvastatin (LIPITOR) 10 MG tablet Take 10 mg by mouth every evening.      BD PEN NEEDLE NANO 2ND GEN 32G X 4 MM MISC 1 each by Other route in the morning, at noon, in the evening, and at bedtime.     bisoprolol (ZEBETA) 5 MG tablet Take 0.5 tablets (2.5 mg total) by mouth daily. 45 tablet 3   blood glucose meter kit and supplies 1 each by Other route 4 (four) times daily. Dispense based on patient and insurance preference. Use up to four times daily as directed. (FOR ICD-10 E10.9, E11.9). 1 each 0   Cholecalciferol (VITAMIN D3) 125 MCG (5000 UT) CAPS Take 1 capsule (5,000 Units total) by mouth daily. 90 capsule 0   Continuous Blood Gluc Receiver (FREESTYLE LIBRE 2 READER) DEVI As directed 1 each 0   Continuous Blood Gluc Sensor (FREESTYLE LIBRE 2 SENSOR) MISC 1 Piece by Does not apply route every 14 (fourteen) days. 2 each 3   diltiazem (CARDIZEM CD) 360 MG 24 hr capsule TAKE 1 CAPSULE(360 MG) BY MOUTH DAILY 90 capsule 3   divalproex (DEPAKOTE ER) 500 MG 24 hr tablet 500 mg in AM, 1000 mg at night 270 tablet 0   ELDERBERRY PO Take 4 g by mouth daily. Gummies 2g each     EPINEPHrine 0.3 mg/0.3 mL IJ SOAJ injection Inject 0.3 mg into the muscle once as needed for anaphylaxis.     FASENRA PEN 30 MG/ML SOAJ INJECT 30MG SUBCUTANEOUSLY  EVERY 8 WEEKS (GIVEN AT MD  OFFICE) 1 mL 6   ferrous sulfate 324 MG TBEC Take 324 mg by mouth daily with breakfast.     fluticasone (FLOVENT HFA) 220 MCG/ACT inhaler 2 puffs twice daily with spacer2 puffs twice daily with spacer 1 each 1   gabapentin (NEURONTIN) 300 MG capsule Take 300 mg by mouth 3 (three) times daily.      hydrALAZINE (APRESOLINE) 25 MG tablet Take 25 mg by mouth 3 (three) times daily.     hydrOXYzine (ATARAX/VISTARIL) 25 MG tablet Take 1  tablet (25 mg total) by mouth every 6 (six) hours as needed for anxiety or nausea. 30 tablet 0   Immune Globulin, Human, 4 GM/20ML SOLN Inject 100 mLs into the skin every 14 (fourteen) days.     insulin lispro (HUMALOG KWIKPEN) 200 UNIT/ML KwikPen Inject 2-10 Units into the skin 3 (three) times daily before meals. Glucose 151-200 take 2 units, glucose 201-250 take 4 units, glucose 251-300 take 6 units, glucose 301-350 take 8 units, glucose 251-400 take 10 units 6 mL 2   Insulin Pen Needle (PEN NEEDLES 3/16") 31G X 5 MM MISC Use as directed with insulin pen 100 each 0   LANTUS SOLOSTAR 100 UNIT/ML Solostar Pen Inject 40 Units into the skin at bedtime.     levothyroxine (SYNTHROID, LEVOTHROID) 112 MCG tablet Take  112 mcg by mouth daily before breakfast.     Magnesium Oxide 400 MG CAPS Take 1 capsule (400 mg total) by mouth daily. 90 capsule 3   Melatonin 3 MG TABS Take 3 mg by mouth at bedtime.     metoCLOPramide (REGLAN) 5 MG tablet Take 5 mg by mouth 3 (three) times daily.     montelukast (SINGULAIR) 10 MG tablet Take 1 tablet by mouth daily.     Multiple Vitamins-Minerals (MULTIVITAMIN WITH MINERALS) tablet Take 1 tablet by mouth daily. Woman     Nebulizer MISC Nebulizer tubing kit 2 each 5   omeprazole (PRILOSEC) 20 MG capsule Take 1 capsule (20 mg total) by mouth 2 (two) times daily before a meal. (Patient taking differently: Take 20 mg by mouth in the morning, at noon, and at bedtime.) 60 capsule 5   ondansetron (ZOFRAN) 4 MG tablet Take 1 tablet (4 mg total) by mouth every 8 (eight) hours as needed for nausea or vomiting. 20 tablet 0   ONETOUCH ULTRA test strip 1 each by Other route in the morning, at noon, in the evening, and at bedtime.     polyethylene glycol (MIRALAX / GLYCOLAX) 17 g packet Take 17 g by mouth daily as needed. 14 each 0   Potassium Chloride ER 20 MEQ TBCR Take 20 mEq by mouth daily.     Tiotropium Bromide Monohydrate (SPIRIVA RESPIMAT) 1.25 MCG/ACT AERS Inhale 2 puffs into  the lungs daily. 4 g 1   topiramate (TOPAMAX) 100 MG tablet Take 1 tablet (100 mg total) by mouth 2 (two) times daily. 180 tablet 1   torsemide (DEMADEX) 20 MG tablet Take 1 tablet daily. May take additional tablet for fluid retention as needed. 90 tablet 3   triamcinolone cream (KENALOG) 0.1 % Apply 1 application topically 2 (two) times daily.     TRULICITY 9.73 ZH/2.9JM SOPN ADMINISTER 0.75 MG UNDER THE SKIN 1 TIME A WEEK 2 mL 2   vitamin B-12 (CYANOCOBALAMIN) 1000 MCG tablet Take 1,000 mcg by mouth daily.     vitamin C (ASCORBIC ACID) 500 MG tablet Take 500 mg by mouth daily. Power C immune support     Current Facility-Administered Medications  Medication Dose Route Frequency Provider Last Rate Last Admin   Benralizumab SOSY 30 mg  30 mg Subcutaneous Q28 days Valentina Shaggy, MD   30 mg at 07/03/20 1017    Allergies as of 04/27/2021 - Review Complete 04/27/2021  Allergen Reaction Noted   Ativan [lorazepam] Hives 02/06/2018   Phenergan [promethazine hcl] Hives 02/06/2018    ROS:  General: Negative for anorexia, weight loss, fever, chills, fatigue, weakness. ENT: Negative for hoarseness, difficulty swallowing , nasal congestion. CV: Negative for chest pain, angina, palpitations, dyspnea on exertion, peripheral edema.  Respiratory: Negative for dyspnea at rest, dyspnea on exertion, cough, sputum, wheezing.  GI: See history of present illness. GU:  Negative for dysuria, hematuria, nocturnal urination.  See HPI Endo: Negative for unusual weight change.    Physical Examination:   BP (!) 127/56   Pulse (!) 48   Temp (!) 97 F (36.1 C)   Ht _0  (1.626 m)   Wt 205 lb 9.6 oz (93.3 kg)   BMI 35.29 kg/m   General: Well-nourished, well-developed in no acute distress.  Eyes: No icterus. Mouth: masked Lungs: Clear to auscultation bilaterally.  Heart: Regular rate and rhythm, no murmurs rubs or gallops.  Abdomen: Bowel sounds are normal, nontender, nondistended, no  hepatosplenomegaly or masses, no  abdominal bruits or hernia , no rebound or guarding.   Extremities: No lower extremity edema. No clubbing or deformities. Neuro: Alert and oriented x 4   Skin: Warm and dry, no jaundice.   Psych: Alert and cooperative, normal mood and affect.  Labs:  Lab Results  Component Value Date   WBC 7.8 04/10/2021   HGB 12.9 04/10/2021   HCT 36.1 04/10/2021   MCV 92.3 04/10/2021   PLT 200 04/10/2021   Lab Results  Component Value Date   CREATININE 0.78 04/16/2021   BUN 28 (H) 04/16/2021   NA 134 (L) 04/16/2021   K 3.5 04/16/2021   CL 96 (L) 04/16/2021   CO2 27 04/16/2021   Lab Results  Component Value Date   ALT 16 04/12/2021   AST 14 (L) 04/12/2021   ALKPHOS 60 04/12/2021   BILITOT 0.5 04/12/2021   Lab Results  Component Value Date   HGBA1C 6.8 (H) 04/08/2021     Imaging Studies: DG Chest 2 View  Result Date: 04/07/2021 CLINICAL DATA:  Chest pain, cough EXAM: CHEST - 2 VIEW COMPARISON:  12/07/2020 FINDINGS: Heart and mediastinal contours are within normal limits. No focal opacities or effusions. No acute bony abnormality. IMPRESSION: No active cardiopulmonary disease. Electronically Signed   By: Rolm Baptise M.D.   On: 04/07/2021 19:16   CT Angio Chest PE W and/or Wo Contrast  Result Date: 04/07/2021 CLINICAL DATA:  60 year old female with concern for pulmonary embolism. EXAM: CT ANGIOGRAPHY CHEST WITH CONTRAST TECHNIQUE: Multidetector CT imaging of the chest was performed using the standard protocol during bolus administration of intravenous contrast. Multiplanar CT image reconstructions and MIPs were obtained to evaluate the vascular anatomy. CONTRAST:  162m OMNIPAQUE IOHEXOL 350 MG/ML SOLN COMPARISON:  Chest CT dated 03/25/2020 and radiograph dated 04/07/2021. FINDINGS: Cardiovascular: There is no cardiomegaly or pericardial effusion. There is coronary vascular calcification and calcification of the mitral annulus. There is mild atherosclerotic  calcification of the thoracic aorta. No aneurysmal dilatation or dissection. The origins of the great vessels of the aortic arch appear patent. Evaluation of the pulmonary arteries is limited due to respiratory motion artifact. No large or central pulmonary artery embolus identified. Mediastinum/Nodes: Mildly enlarged right hilar lymph node measuring 13 mm. The esophagus is grossly unremarkable. No mediastinal fluid collection. Lungs/Pleura: The lungs are clear. There is no pleural effusion or pneumothorax. Mild bilateral lower lobe bronchial thickening and endobronchial secretions. Aspiration is not excluded clinical correlation is recommended. The central airways remain patent. Upper Abdomen: No acute abnormality. Musculoskeletal: Degenerative changes of the spine. No acute osseous pathology. Review of the MIP images confirms the above findings. IMPRESSION: 1. No CT evidence of central pulmonary artery embolus. 2. Mild bilateral lower lobe bronchial thickening and endobronchial secretions. 3. Aortic Atherosclerosis (ICD10-I70.0). Electronically Signed   By: AAnner CreteM.D.   On: 04/07/2021 20:44   DG Chest Portable 1 View  Result Date: 04/09/2021 CLINICAL DATA:  Shortness of breath and chest pain EXAM: PORTABLE CHEST 1 VIEW COMPARISON:  04/07/2021 FINDINGS: No focal opacity or pleural effusion. Stable cardiomediastinal silhouette. Mitral annular calcification. No pneumothorax. IMPRESSION: No active disease. Electronically Signed   By: KDonavan FoilM.D.   On: 04/09/2021 22:51   DG Swallowing Func-Speech Pathology  Result Date: 04/16/2021 Formatting of this result is different from the original. Objective Swallowing Evaluation: Type of Study: MBS-Modified Barium Swallow Study  Patient Details Name: TAdelyna BrockmanMRN: [id number] of Birth: 2May 17, 1962Today's Date: 04/16/2021 Time: SLP Start Time (ACUTE ONLY):  1441 -SLP Stop Time (ACUTE ONLY): 1458 SLP Time Calculation (min) (ACUTE ONLY): 17 min Past  Medical History: Past Medical History: Diagnosis Date  Asthma   Atrial fibrillation (HCC)   Bipolar disorder (HCC)   Bronchiectasis (HCC)   Carpal tunnel syndrome of right wrist   CHF (congestive heart failure) (HCC)   Complication of anesthesia   "my Dr in [place] told me I had an allergic reaction to the combination of the pain meds and the anesthesia" she does not remember what happened but "I eneded up in the ICU for 5 days".  H/O congenital atrial septal defect (ASD) repair   Hypogammaglobulinemia ([place])   Hypothyroid   IgE deficiency (HCC)   Neuropathy   Neuropathy   Osteoarthritis   Pinched nerve in neck   PTSD (post-traumatic stress disorder)   Recurrent sinusitis   Short-term memory loss   Sleep apnea   TIA (transient ischemic attack)   Tremors of nervous system   seeing PCP for this.  Type 2 diabetes mellitus (HCC)   Urticaria   Wound infection  Past Surgical History: Past Surgical History: Procedure Laterality Date  ASD REPAIR  1968  BIOPSY  01/26/2021  Procedure: BIOPSY;  Surgeon: [name], DO;  [facility];  Service: Endoscopy;;  gastric  CARDIAC SURGERY    CARDIOVERSION    CARDIOVERSION N/A 08/29/2018  Procedure: CARDIOVERSION;  Surgeon: Arnoldo Lenis, MD;  Location: [facility];  Service: Endoscopy;  Laterality: N/A;  [place]  COLONOSCOPY WITH PROPOFOL N/A 01/26/2021  Procedure: COLONOSCOPY WITH PROPOFOL;  Surgeon: [name], DO;  [facility]: [facility];  Service: Endoscopy;  Laterality: N/A;  am appt, diabetic  ESOPHAGOGASTRODUODENOSCOPY (EGD) WITH PROPOFOL N/A 01/26/2021  Procedure: ESOPHAGOGASTRODUODENOSCOPY (EGD) WITH PROPOFOL;  Surgeon: [name], DO;  [facility];  Service: Endoscopy;  Laterality: N/A;  LEFT HEART CATH AND CORONARY ANGIOGRAPHY N/A 08/30/2019  Procedure: LEFT HEART CATH AND CORONARY ANGIOGRAPHY;  Surgeon: [name], MD;  Location: [facility];  Service: Cardiovascular;  Laterality:  N/A;  NASAL SINUS SURGERY  2017  POLYPECTOMY  01/26/2021  Procedure: POLYPECTOMY;  Surgeon: [name], DO;  [facility];  Service: Endoscopy;;  colon HPI: Pt is a 60 y/o female admitted 7/1 with SOB and chest pain. Pt found to have Afib with RVR and asthma exacerbation. CXR 7/1 was negative. PMH: ASD s/p repair, paroxysmal atrial fibrillation s/p DCCV, insulin-dependent diabetes mellitus, PTSD, bipolar disorder, hypothyroidism, hyperlipidemia, OSA on CPAP, essential hypertension, GERD, hypogammaglobulinemia, GERD  No data recorded Assessment / Plan / Recommendation CHL IP CLINICAL IMPRESSIONS 04/16/2021 Clinical Impression Pt was seen in radiology suite for modified barium swallow study. Trials of puree solids, regular texture solids, a 51m barium tablet, and thin liquids via cup and straw were administered. Pt's oropharyngeal swallow mechanism was within normal limits. Coughing was noted throughout the study; however, no instances of penetration or aspiration were demonstrated and pt was able to adequately coordinate respiration and deglutition, and protect her airway despite constant coughing which often required sudden inspiration after boluses had already been propelled to the valleculae. Esophageal screening revealed stasis of barium in the mid and lower esophagus which improved slightly with a secondary swallow. This aligns with pt's c/o globus sensation and esophageal assessment (e.g., esophagram) is recommended to determine etiology of this. Dr. [place]was advised of results and recommendations. Pt reported that her discharge is planned for 7/9 and that  she is amenable to completing an esophagram as an outpatient. Pt reported that she will likely continue to order some softer foods like meatloaf, but it was agreed that her current diet of regular texture solids and thin liquids will be continued to maximize her options. Further skilled SLP services are not clinically indicated at this  time. SLP Visit Diagnosis Dysphagia, unspecified (R13.10) Attention and concentration deficit following -- Frontal lobe and executive function deficit following -- Impact on safety and function Mild aspiration risk   CHL IP TREATMENT RECOMMENDATION 04/16/2021 Treatment Recommendations No treatment recommended at this time   Prognosis 04/16/2021 Prognosis for Safe Diet Advancement Good Barriers to Reach Goals -- Barriers/Prognosis Comment -- CHL IP DIET RECOMMENDATION 04/16/2021 SLP Diet Recommendations Regular solids;Thin liquid Liquid Administration via Cup;Straw Medication Administration Whole meds with liquid Compensations Slow rate Postural Changes Remain semi-upright after after feeds/meals (Comment);Seated upright at 90 degrees   CHL IP OTHER RECOMMENDATIONS 04/16/2021 Recommended Consults Consider esophageal assessment Oral Care Recommendations Oral care BID Other Recommendations --   CHL IP FOLLOW UP RECOMMENDATIONS 04/16/2021 Follow up Recommendations None   No flowsheet data found.     CHL IP ORAL PHASE 04/16/2021 Oral Phase WFL Oral - Pudding Teaspoon -- Oral - Pudding Cup -- Oral - Honey Teaspoon -- Oral - Honey Cup -- Oral - Nectar Teaspoon -- Oral - Nectar Cup -- Oral - Nectar Straw -- Oral - Thin Teaspoon -- Oral - Thin Cup -- Oral - Thin Straw -- Oral - Puree -- Oral - Mech Soft -- Oral - Regular -- Oral - Multi-Consistency -- Oral - Pill -- Oral Phase - Comment --  CHL IP PHARYNGEAL PHASE 04/16/2021 Pharyngeal Phase WFL Pharyngeal- Pudding Teaspoon -- Pharyngeal -- Pharyngeal- Pudding Cup -- Pharyngeal -- Pharyngeal- Honey Teaspoon -- Pharyngeal -- Pharyngeal- Honey Cup -- Pharyngeal -- Pharyngeal- Nectar Teaspoon -- Pharyngeal -- Pharyngeal- Nectar Cup -- Pharyngeal -- Pharyngeal- Nectar Straw -- Pharyngeal -- Pharyngeal- Thin Teaspoon -- Pharyngeal -- Pharyngeal- Thin Cup -- Pharyngeal -- Pharyngeal- Thin Straw -- Pharyngeal -- Pharyngeal- Puree -- Pharyngeal -- Pharyngeal- Mechanical Soft -- Pharyngeal --  Pharyngeal- Regular -- Pharyngeal -- Pharyngeal- Multi-consistency -- Pharyngeal -- Pharyngeal- Pill -- Pharyngeal -- Pharyngeal Comment --  CHL IP CERVICAL ESOPHAGEAL PHASE 04/16/2021 Cervical Esophageal Phase Impaired Pudding Teaspoon -- Pudding Cup -- Honey Teaspoon -- Honey Cup -- Nectar Teaspoon -- Nectar Cup -- Nectar Straw -- Thin Teaspoon -- Thin Cup -- Thin Straw -- Puree (No Data) Mechanical Soft -- Regular -- Multi-consistency -- Pill -- Cervical Esophageal Comment -- Shanika I. Hardin Negus, [place], [place] Office number [phone] Pager [place] 04/16/2021, 3:30 PM                Assessment:  Abdominal pain: Previously complained of right lower quadrant pain but now mostly lower abdomen.  CT findings as outlined above.  Colonoscopy up-to-date.  Pain worse with meals and with bowel movements.  We will optimize constipation regimen.  Trial of Amitiza.  GERD/gastritis/dysphagia: Weight stable.  Some nausea but no vomiting.  Complains of ongoing dysphagia/globus sensation.  EGD up-to-date with gastritis.  Modified barium swallow study with some stasis the mid to distal esophagus.  Recommended barium swallow.  She decided to increase omeprazole to 20 mg 3 times daily on her own.  Would encourage her to reduce to twice daily and if symptoms worsen change PPI.  Reinforced antireflux measures.  Constipation: Stop MiraLAX.  Begin Amitiza 24 mcg 1-2 times daily.  Urinary incontinence: Complains of constant loss of urine, not associated with urgency.  CT showed some air and fluid in endometrial canal and vagina which could be normal.  Radiologist did not identify obvious fistula.  She has seen GYN.  Encouraged her to follow-up with urology for urinary incontinence.    Plan: Urology referral.  Barium pill esophagram. Would recommend stopping or limiting metoclopramide use due to risk of tardive dyskinesia. Decrease omeprazole to 20 mg twice daily, if  symptoms worsen she will let us know and we can change her regimen. Home MiraLAX.  Try Amitiza 24 mcg 1-2 times daily.
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
## 2021-04-26 NOTE — Telephone Encounter (Signed)
Spoke to [employer] at [facility] to increase dose to 25 gram biweekly

## 2021-04-27 ENCOUNTER — Other Ambulatory Visit: Payer: Self-pay

## 2021-04-27 ENCOUNTER — Encounter: Payer: Self-pay | Admitting: Allergy & Immunology

## 2021-04-27 ENCOUNTER — Encounter: Payer: Self-pay | Admitting: Gastroenterology

## 2021-04-27 ENCOUNTER — Telehealth: Payer: Self-pay | Admitting: Internal Medicine

## 2021-04-27 ENCOUNTER — Ambulatory Visit (INDEPENDENT_AMBULATORY_CARE_PROVIDER_SITE_OTHER): Payer: Medicare Other | Admitting: Gastroenterology

## 2021-04-27 VITALS — BP 127/56 | HR 48 | Temp 97.0°F | Ht 64.0 in | Wt 205.6 lb

## 2021-04-27 DIAGNOSIS — R32 Unspecified urinary incontinence: Secondary | ICD-10-CM

## 2021-04-27 DIAGNOSIS — R1310 Dysphagia, unspecified: Secondary | ICD-10-CM | POA: Insufficient documentation

## 2021-04-27 DIAGNOSIS — K219 Gastro-esophageal reflux disease without esophagitis: Secondary | ICD-10-CM | POA: Diagnosis not present

## 2021-04-27 DIAGNOSIS — K5900 Constipation, unspecified: Secondary | ICD-10-CM | POA: Diagnosis not present

## 2021-04-27 DIAGNOSIS — R1030 Lower abdominal pain, unspecified: Secondary | ICD-10-CM

## 2021-04-27 DIAGNOSIS — R1319 Other dysphagia: Secondary | ICD-10-CM | POA: Diagnosis not present

## 2021-04-27 DIAGNOSIS — R109 Unspecified abdominal pain: Secondary | ICD-10-CM | POA: Insufficient documentation

## 2021-04-27 MED ORDER — LUBIPROSTONE 24 MCG PO CAPS: 24.0000 ug | ORAL_CAPSULE | Freq: Two times a day (BID) | ORAL | 5 refills | Status: DC

## 2021-04-27 NOTE — Progress Notes (Signed)
Amb
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
## 2021-04-27 NOTE — Telephone Encounter (Signed)
Patient called and said that the prescription leslie sent into her pharmacy needs prior auth

## 2021-04-27 NOTE — Telephone Encounter (Signed)
Dena called pharmacy and requested cover my meds prior auth to be sent over to [place].

## 2021-04-27 NOTE — Patient Instructions (Addendum)
Would recommend stopping or limiting metoclopramide due to risk of tardive dyskinesia (uncontrolled muscle movements).  Drop omeprazole to 20 mg twice daily. If you feel like heartburn or swallowing worsens when dropping back to twice daily from three times daily, please let me know and we will adjust medication.  Hold Miralax. Try amitiza 24 mcg once or twice daily for constipation. RX sent to pharmacy.  Xray of your esophagus to further evaluate swallowing concerns.

## 2021-04-28 ENCOUNTER — Other Ambulatory Visit: Payer: Self-pay | Admitting: Internal Medicine

## 2021-04-28 NOTE — Telephone Encounter (Signed)
Spoke to patient and advised of change in dosing and will be delivered once the pharmacy gets same approved

## 2021-04-29 ENCOUNTER — Encounter: Payer: Self-pay | Admitting: Psychiatry

## 2021-04-29 ENCOUNTER — Telehealth: Payer: Self-pay | Admitting: Internal Medicine

## 2021-04-29 ENCOUNTER — Other Ambulatory Visit: Payer: Self-pay

## 2021-04-29 ENCOUNTER — Telehealth (INDEPENDENT_AMBULATORY_CARE_PROVIDER_SITE_OTHER): Payer: Medicare Other | Admitting: Psychiatry

## 2021-04-29 DIAGNOSIS — F3341 Major depressive disorder, recurrent, in partial remission: Secondary | ICD-10-CM | POA: Diagnosis not present

## 2021-04-29 MED ORDER — DIVALPROEX SODIUM ER 500 MG PO TB24: ORAL_TABLET | ORAL | 0 refills | Status: DC

## 2021-04-29 NOTE — Telephone Encounter (Signed)
Spoke with the pt advised her that I have to go through [employer] and not Cover My Meds to get her medication for generic Amitiza approved. I could not give her a time line of when they would/would not approve. Her Medicare part A & B does not cover medications. I advised the pt that I would get to it as soon as I can because i'm just getting authorization to get on to [facility].

## 2021-04-29 NOTE — Telephone Encounter (Signed)
Pt called saying that no one called her back yesterday. I told her the nurse was on another line and it was documented in her chart that [facility] requested from [facility] Med[facility] to fax [place] a PA on her medication. Pt would like for nurse to call her. [phone]

## 2021-04-30 ENCOUNTER — Telehealth: Payer: Medicare Other | Admitting: Psychiatry

## 2021-05-03 ENCOUNTER — Other Ambulatory Visit: Payer: Self-pay

## 2021-05-03 ENCOUNTER — Ambulatory Visit (HOSPITAL_COMMUNITY)
Admission: RE | Admit: 2021-05-03 | Discharge: 2021-05-03 | Disposition: A | Discharge status: Home / Self Care | Payer: Medicare Other | Source: Ambulatory Visit | Attending: Gastroenterology | Admitting: Gastroenterology

## 2021-05-03 DIAGNOSIS — R1319 Other dysphagia: Secondary | ICD-10-CM | POA: Insufficient documentation

## 2021-05-03 DIAGNOSIS — M4802 Spinal stenosis, cervical region: Secondary | ICD-10-CM | POA: Diagnosis not present

## 2021-05-03 DIAGNOSIS — I10 Essential (primary) hypertension: Secondary | ICD-10-CM | POA: Diagnosis not present

## 2021-05-03 DIAGNOSIS — Z6835 Body mass index (BMI) 35.0-35.9, adult: Secondary | ICD-10-CM | POA: Diagnosis not present

## 2021-05-03 DIAGNOSIS — R1310 Dysphagia, unspecified: Secondary | ICD-10-CM | POA: Diagnosis not present

## 2021-05-03 IMAGING — RF DG ESOPHAGUS
5 series · 14 of 20 positions shown · non-contrast
Comparison: None.

CLINICAL DATA: Dysphagia.

EXAM:
ESOPHOGRAM / BARIUM SWALLOW / BARIUM TABLET STUDY
TECHNIQUE: Combined double contrast and single contrast examination performed
using effervescent crystals, thick barium liquid, and thin barium
liquid. The patient was observed with fluoroscopy swallowing a 13 mm
barium sulphate tablet.
FLUOROSCOPY TIME:  Radiation Exposure Index (if provided by the
fluoroscopic device): 24.6 mGy.

[Series 1: cp_standard · 0.25mm/px · 3 of 128 frames shown (1 of 5)]
[frame 20/128]
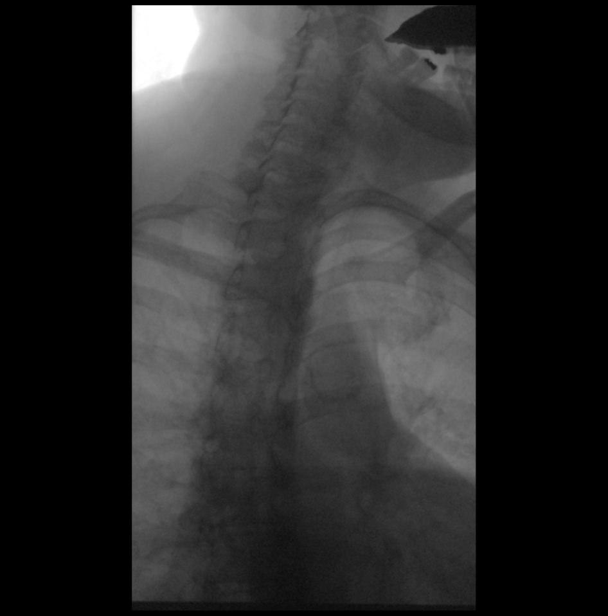
[frame 65/128]
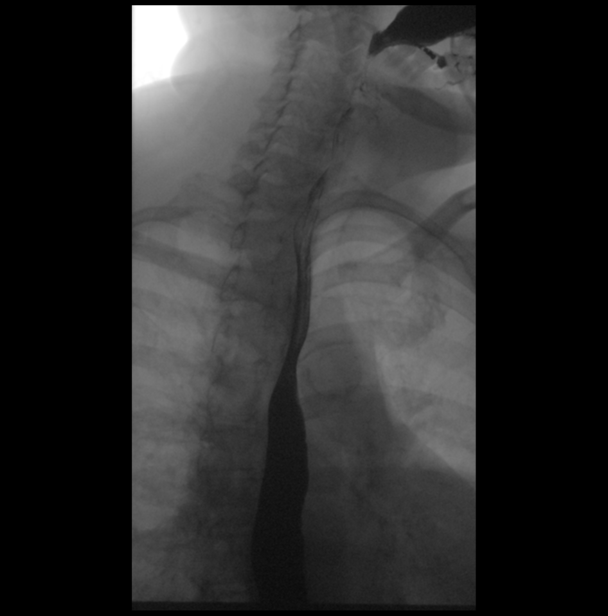
[frame 109/128]
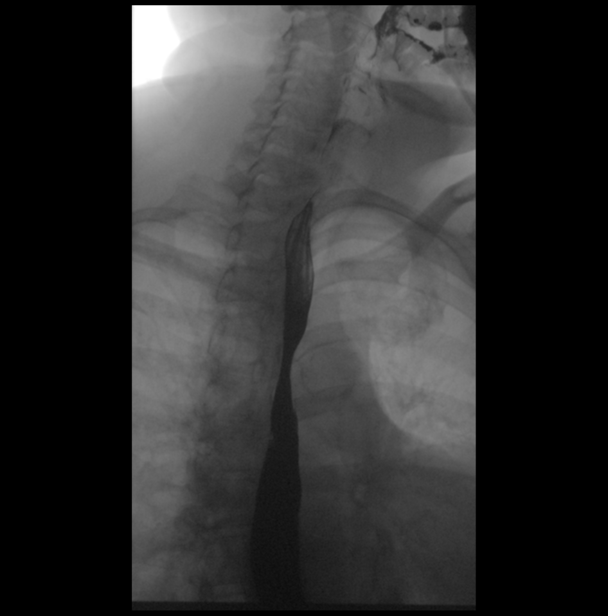

[Series 2: cp_standard · 0.25mm/px · 3 of 255 frames shown (2 of 5)]
[frame 39/255]
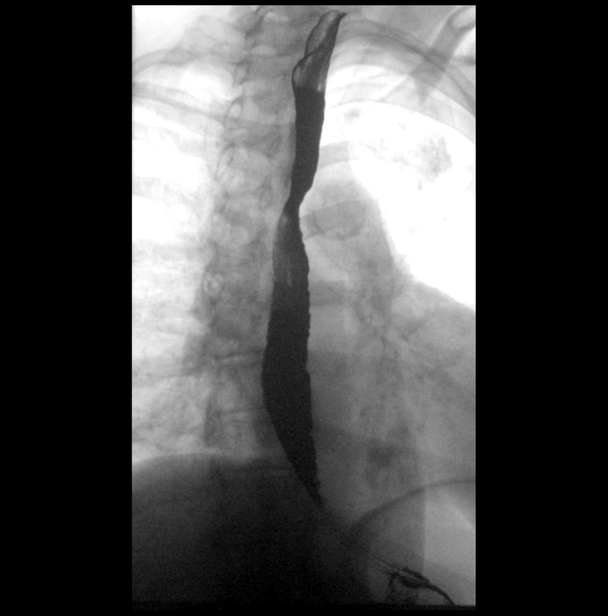
[frame 128/255]
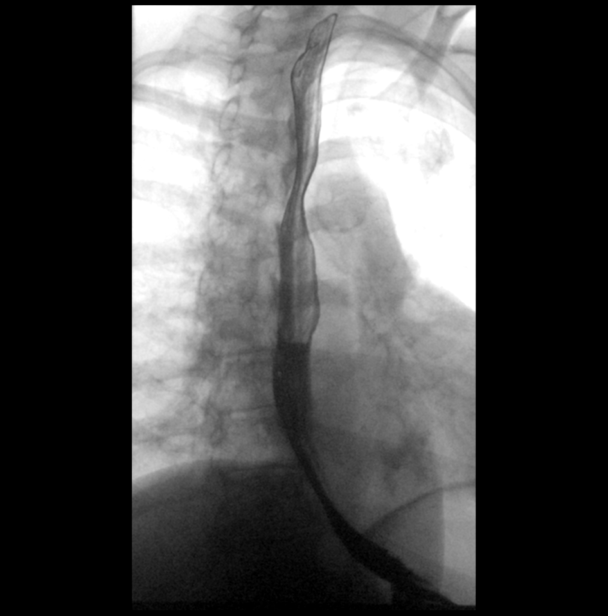
[frame 217/255]
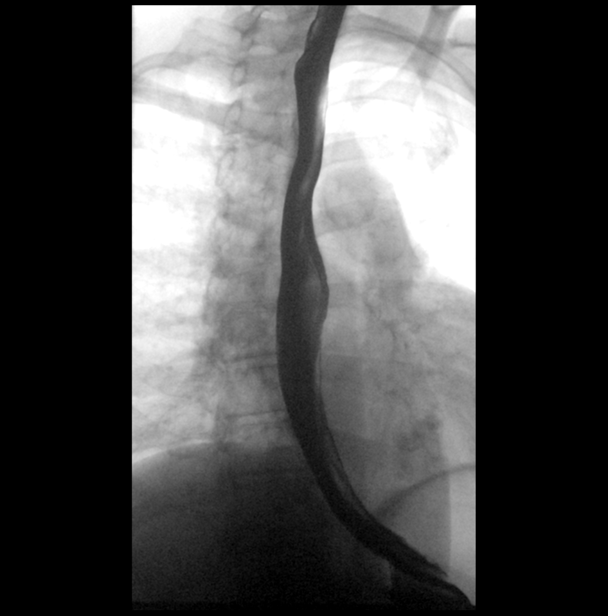

[Series 3: cp_standard · 0.26mm/px · 2 of 166 frames shown (3 of 5)]
[frame 84/166]
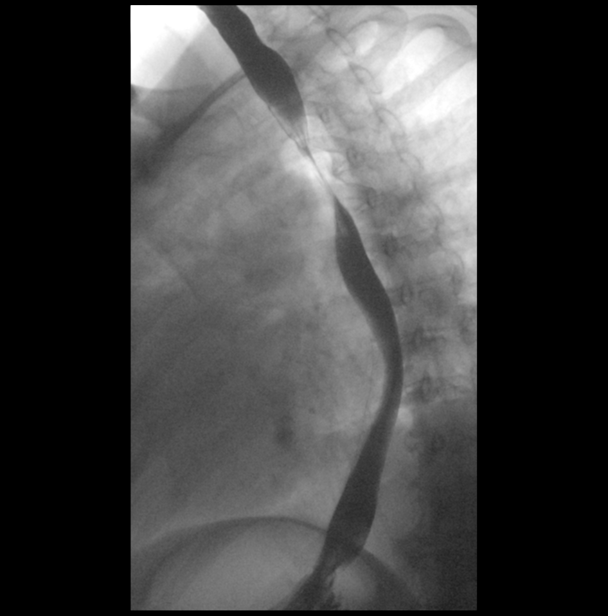
[frame 122/166]
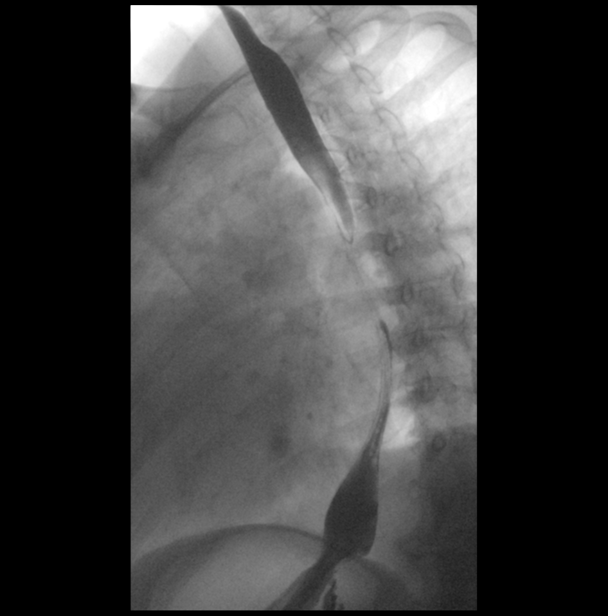

[Series 4: cp_standard · 0.26mm/px · 3 of 207 frames shown (4 of 5)]
[frame 32/207]
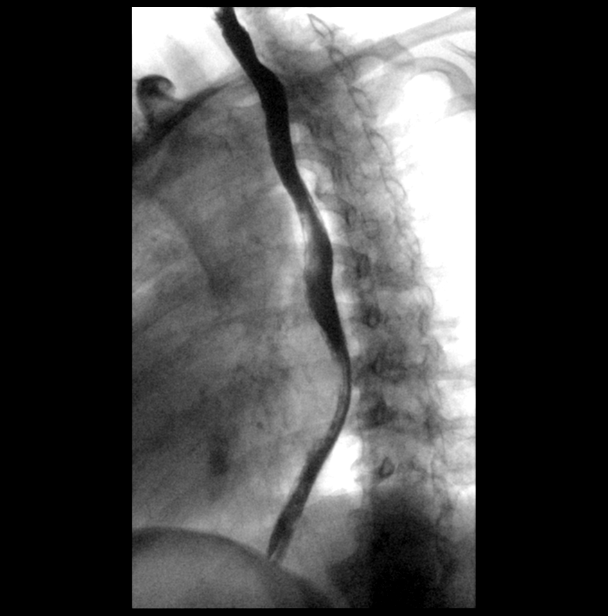
[frame 104/207]
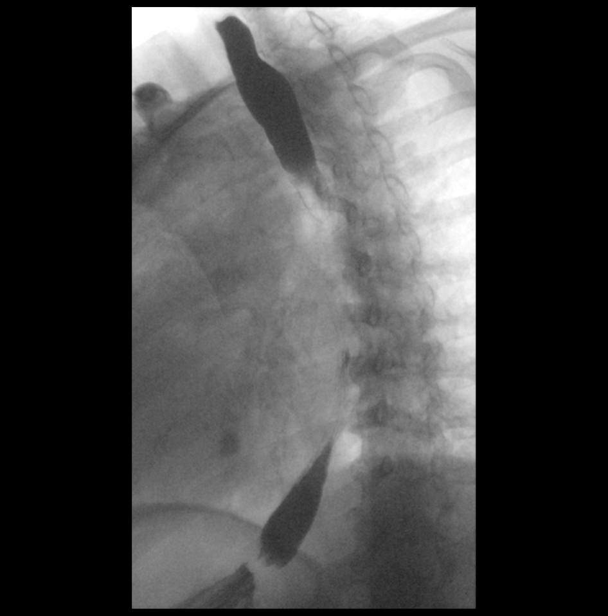
[frame 176/207]
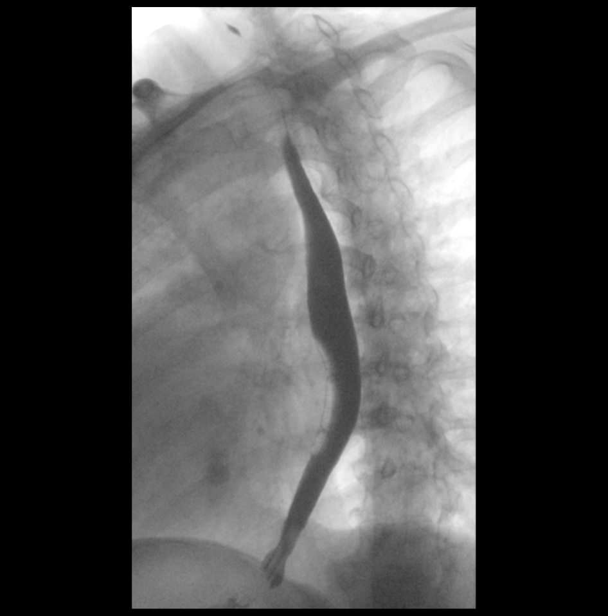

[Series 5: cp_standard · 0.26mm/px · 3 of 164 frames shown (5 of 5)]
[frame 25/164]
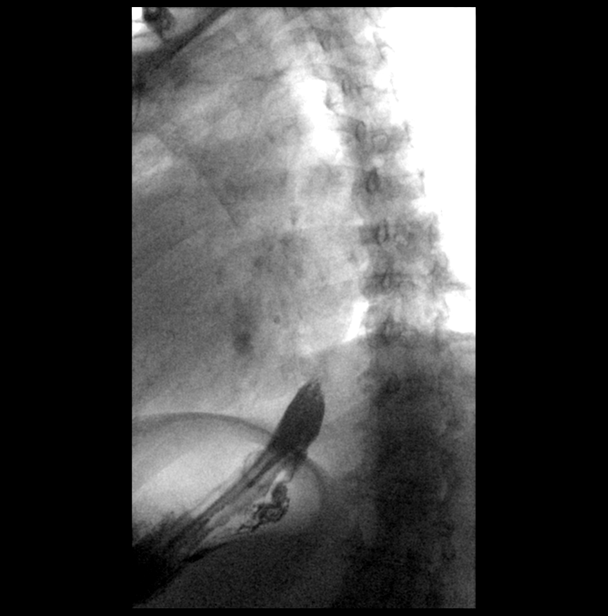
[frame 66/164]
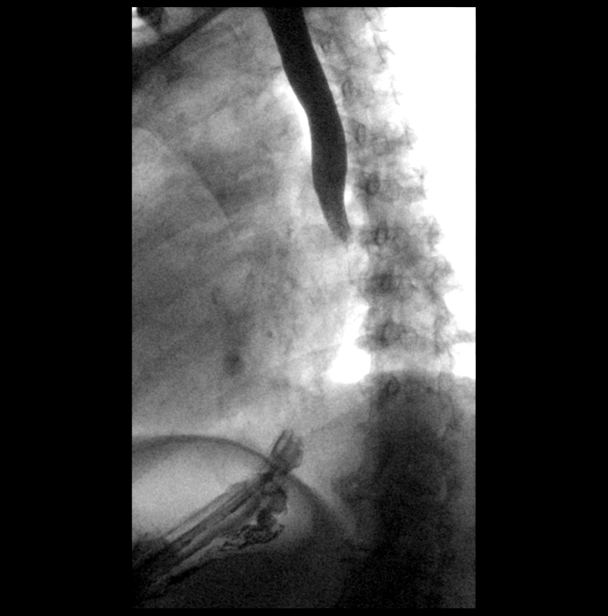
[frame 140/164]
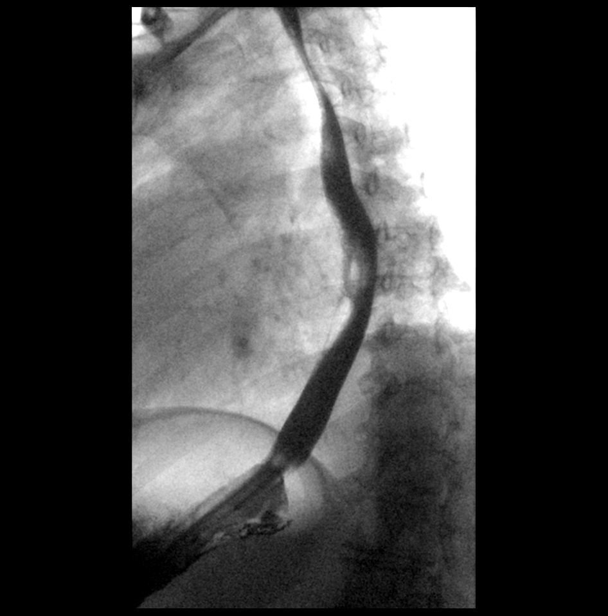

[14 of 20 positions shown; findings below may reference images not displayed]

FINDINGS: No mass or stricture is noted in the esophagus. Mild tertiary
contractions are noted in the distal esophagus suggesting
presbyesophagus. Small sliding-type hiatal hernia is noted. No
reflux is noted. Barium tablet passed through esophagus and into
stomach without difficulty or delay.
IMPRESSION: No definite mass or stricture is noted in the esophagus. Small
sliding-type hiatal hernia. Mild tertiary contractions are noted in
distal esophagus suggesting presbyesophagus.

## 2021-05-05 ENCOUNTER — Telehealth: Payer: Self-pay

## 2021-05-05 NOTE — Telephone Encounter (Signed)
error 

## 2021-05-05 NOTE — Telephone Encounter (Signed)
PA done by hand for Lubiprostone 24 mcg by fax today. [place] [employer] cannot be one at this time will fax to [phone].  Waiting on response

## 2021-05-13 ENCOUNTER — Other Ambulatory Visit: Payer: Self-pay | Admitting: "Endocrinology

## 2021-05-18 ENCOUNTER — Ambulatory Visit (INDEPENDENT_AMBULATORY_CARE_PROVIDER_SITE_OTHER): Payer: Medicare Other | Admitting: Clinical

## 2021-05-18 DIAGNOSIS — F4310 Post-traumatic stress disorder, unspecified: Secondary | ICD-10-CM

## 2021-05-18 NOTE — Progress Notes (Signed)
Virtual Visit via Video Note  I connected with [name] on 05/18/21 at  9:00 AM EDT by a video enabled telemedicine application and verified that I am speaking with the correct person using two identifiers.  Location: Patient: Home Provider: Office   I discussed the limitations of evaluation and management by telemedicine and the availability of in person appointments. The patient expressed understanding and agreed to proceed.    THERAPIST PROGRESS NOTE   Session Time: 9:00 AM-:9:30 AM   Participation Level: Active   Behavioral Response: CasualAlertDepressed   Type of Therapy: Individual Therapy   Treatment Goals addressed: Coping   Interventions: CBT, DBT, Solution Focused, Strength-based and Supportive   Summary: Laura [name] is a 60 y.o. female who presents with PTSD. The OPT therapist worked with the patient for her ongoing OPT treatment session. The OPT therapist utilized Motivational Interviewing to assist in creating therapeutic repore. The patient in the session was engaged and work in collaboration giving feedback about her triggers and symptoms over the past few weeks including recovering from her last hospitalization. The patient connected via video from [facility] each and noted she was on vacation with friends. The OPT therapist utilized Cognitive Behavioral Therapy through cognitive restructuring as well as worked with the patient on coping strategies to assist in management of mental health symptoms. The patient reviewed her health recovery noting feeling much better comparatively from her last therapy visit. The OPT therapist provided emotional support and validated the patients feelings. The OPT therapist placed emphasis on the patient keeping all upcoming health appointments and adhering to ongoing health treatment recommendations.   Suicidal/Homicidal: Nowithout intent/plan   Therapist Response:The OPT therapist worked with the patient for the patients scheduled  session. The patient was engaged in her session and gave feedback in relation to triggers, symptoms, and behavior responses over the past few weeks. The OPT therapist worked with the patient utilizing an in session Cognitive Behavioral Therapy exercise. The patient was responsive in the session and verbalized, "I am feeling much better and I am here at [facility] and just laying out on the beach and going to listen to music at the bar". The OPT therapist provided Psychoeducation throughout the session. The OPT therapist will continue treatment work with the patient in her next scheduled session   Plan: Return again in 2/3 weeks.   Diagnosis:      Axis I:Post Traumatic Stress Disorder                           Axis II: No diagnosis   I discussed the assessment and treatment plan with the patient. The patient was provided an opportunity to ask questions and all were answered. The patient agreed with the plan and demonstrated an understanding of the instructions.   The patient was advised to call back or seek an in-person evaluation if the symptoms worsen or if the condition fails to improve as anticipated.   I provided 30 minutes of non-face-to-face time during this encounter.   [name], LCSW   05/18/2021

## 2021-05-20 ENCOUNTER — Other Ambulatory Visit (HOSPITAL_COMMUNITY)
Admission: RE | Admit: 2021-05-20 | Discharge: 2021-05-20 | Disposition: A | Discharge status: Home / Self Care | Payer: Medicare Other | Source: Ambulatory Visit | Attending: "Endocrinology | Admitting: "Endocrinology

## 2021-05-20 DIAGNOSIS — E039 Hypothyroidism, unspecified: Secondary | ICD-10-CM | POA: Diagnosis not present

## 2021-05-20 LAB — COMPREHENSIVE METABOLIC PANEL
ALT: 13 U/L (ref 0–44)
AST: 12 U/L — ABNORMAL LOW (ref 15–41)
Albumin: 3.9 g/dL (ref 3.5–5.0)
Alkaline Phosphatase: 77 U/L (ref 38–126)
Anion gap: 6 (ref 5–15)
BUN: 20 mg/dL (ref 6–20)
CO2: 23 mmol/L (ref 22–32)
Calcium: 9 mg/dL (ref 8.9–10.3)
Chloride: 109 mmol/L (ref 98–111)
Creatinine, Ser: 0.72 mg/dL (ref 0.44–1.00)
GFR, Estimated: >60 mL/min (ref >60)
Glucose, Bld: 160 mg/dL — ABNORMAL HIGH (ref 70–99)
Potassium: 4 mmol/L (ref 3.5–5.1)
Sodium: 138 mmol/L (ref 135–145)
Total Bilirubin: 0.6 mg/dL (ref 0.3–1.2)
Total Protein: 6.7 g/dL (ref 6.5–8.1)

## 2021-05-20 LAB — LIPID PANEL
Cholesterol: 139 mg/dL (ref 0–200)
HDL: 45 mg/dL (ref >40)
LDL Cholesterol: 72 mg/dL (ref 0–99)
Total CHOL/HDL Ratio: 3.1 RATIO
Triglycerides: 112 mg/dL (ref <150)
VLDL: 22 mg/dL (ref 0–40)

## 2021-05-20 LAB — T4, FREE: Free T4: 1.23 ng/dL — ABNORMAL HIGH (ref 0.61–1.12)

## 2021-05-20 LAB — TSH: TSH: 1.068 u[IU]/mL (ref 0.350–4.500)

## 2021-05-21 ENCOUNTER — Encounter: Payer: Self-pay | Admitting: "Endocrinology

## 2021-05-21 ENCOUNTER — Ambulatory Visit (INDEPENDENT_AMBULATORY_CARE_PROVIDER_SITE_OTHER): Payer: Medicare Other | Admitting: "Endocrinology

## 2021-05-21 VITALS — BP 122/60 | HR 68 | Ht 64.0 in | Wt 199.8 lb

## 2021-05-21 DIAGNOSIS — E538 Deficiency of other specified B group vitamins: Secondary | ICD-10-CM

## 2021-05-21 DIAGNOSIS — E1165 Type 2 diabetes mellitus with hyperglycemia: Secondary | ICD-10-CM

## 2021-05-21 DIAGNOSIS — E039 Hypothyroidism, unspecified: Secondary | ICD-10-CM | POA: Diagnosis not present

## 2021-05-21 DIAGNOSIS — E559 Vitamin D deficiency, unspecified: Secondary | ICD-10-CM | POA: Diagnosis not present

## 2021-05-21 NOTE — Patient Instructions (Signed)

## 2021-05-21 NOTE — Progress Notes (Signed)
05/21/2021, 11:35 AM   Endocrinology follow-up note   Subjective:    Patient ID: Laura [name], female    DOB: 1961-10-07.  Laura [name] was seen since 2019 for management of diabetes, also seen during her last visit in relation to her steroid exposure to rule out adrenal insufficiency.  PMD: [name], MD.   Past Medical History:  Diagnosis Date   Asthma    Atrial fibrillation ([place])    Bipolar disorder ([place])    Bronchiectasis ([place])    Carpal tunnel syndrome of right wrist    CHF (congestive heart failure) ([place])    Complication of anesthesia    "my Dr in [place] told me I had an allergic reaction to the combination of the pain meds and the anesthesia" she does not remember what happened but "I eneded up in the ICU for 5 days".   H/O congenital atrial septal defect (ASD) repair    Hypogammaglobulinemia ([place])    Hypothyroid    IgE deficiency (HCC)    Neuropathy    Neuropathy    Osteoarthritis    Pinched nerve in neck    PTSD (post-traumatic stress disorder)    Recurrent sinusitis    Short-term memory loss    Sleep apnea    TIA (transient ischemic attack)    Tremors of nervous system    seeing PCP for this.   Type 2 diabetes mellitus (HCC)    Urticaria    Wound infection    Past Surgical History:  Procedure Laterality Date   ASD REPAIR  1968   BIOPSY  01/26/2021   Procedure: BIOPSY;  Surgeon: [name], DO;  [facility];  Service: Endoscopy;;  gastric   CARDIAC SURGERY     CARDIOVERSION     CARDIOVERSION N/A 08/29/2018   Procedure: CARDIOVERSION;  Surgeon: Arnoldo Lenis, MD;  [facility];  Service: Endoscopy;  Laterality: N/A;   [place]   COLONOSCOPY WITH PROPOFOL N/A 01/26/2021   Procedure: COLONOSCOPY WITH PROPOFOL;  Surgeon: [name], DO;  [facility];  Service: Endoscopy;  Laterality: N/A;  am  appt, diabetic   ESOPHAGOGASTRODUODENOSCOPY (EGD) WITH PROPOFOL N/A 01/26/2021   Procedure: ESOPHAGOGASTRODUODENOSCOPY (EGD) WITH PROPOFOL;  Surgeon: [name], DO;  [facility];  Service: Endoscopy;  Laterality: N/A;   LEFT HEART CATH AND CORONARY ANGIOGRAPHY N/A 08/30/2019   Procedure: LEFT HEART CATH AND CORONARY ANGIOGRAPHY;  Surgeon: [name], MD;  Location: [facility];  Service: Cardiovascular;  Laterality: N/A;   NASAL SINUS SURGERY  2017   POLYPECTOMY  01/26/2021   Procedure: POLYPECTOMY;  Surgeon: [name], DO;  [facility];  Service: Endoscopy;;  colon   Social History   Socioeconomic History   Marital status: Widowed    Spouse name: Not on file   Number of children: Not on file   Years of education: Not on file   Highest education level: Not on file  Occupational History   Not on file  Tobacco Use   Smoking status: Former    Packs/day: 1.00  Years: 28.00    Pack years: 28.00    Types: Cigarettes    Start date: 9    Quit date: 2008    Years since quitting: 14.6   Smokeless tobacco: Never  Vaping Use   Vaping Use: Never used  Substance and Sexual Activity   Alcohol use: Not Currently   Drug use: Not Currently   Sexual activity: Not Currently    Birth control/protection: Post-menopausal  Other Topics Concern   Not on file  Social History Narrative   Not on file   Social Determinants of Health   Financial Resource Strain: Medium Risk   Difficulty of Paying Living Expenses: Somewhat hard  Food Insecurity: Food Insecurity Present   Worried About Charity fundraiser in the Last Year: Often true   Arboriculturist in the Last Year: Often true  Transportation Needs: No Transportation Needs   Lack of Transportation (Medical): No   Lack of Transportation (Non-Medical): No  Physical Activity: Insufficiently Active   Days of Exercise per Week: 3 days   Minutes of Exercise per Session: 20 min  Stress:  Stress Concern Present   Feeling of Stress : To some extent  Social Connections: Moderately Isolated   Frequency of Communication with Friends and Family: More than three times a week   Frequency of Social Gatherings with Friends and Family: More than three times a week   Attends Religious Services: 1 to 4 times per year   Active Member of [employer] or Organizations: No   Attends Archivist Meetings: Never   Marital Status: Widowed   Outpatient Encounter Medications as of 05/21/2021  Medication Sig   acetaminophen (TYLENOL) 325 MG tablet Take 2 tablets (650 mg total) by mouth every 6 (six) hours as needed for mild pain, fever or headache.   albuterol (PROVENTIL) (2.5 MG/3ML) 0.083% nebulizer solution Take 3 mLs (2.5 mg total) by nebulization every 4 (four) hours as needed for wheezing or shortness of breath.   albuterol (VENTOLIN HFA) 108 (90 Base) MCG/ACT inhaler INHALE 2 PUFFS INTO THE LUNGS EVERY 6 HOURS AS NEEDED FOR WHEEZING OR SHORTNESS OF BREATH   apixaban (ELIQUIS) 5 MG TABS tablet Take 5 mg by mouth 2 (two) times daily.   atorvastatin (LIPITOR) 10 MG tablet Take 10 mg by mouth every evening.    BD PEN NEEDLE NANO 2ND GEN 32G X 4 MM MISC 1 each by Other route in the morning, at noon, in the evening, and at bedtime.   bisoprolol (ZEBETA) 5 MG tablet Take 0.5 tablets (2.5 mg total) by mouth daily.   blood glucose meter kit and supplies 1 each by Other route 4 (four) times daily. Dispense based on patient and insurance preference. Use up to four times daily as directed. (FOR ICD-10 E10.9, E11.9).   Cholecalciferol (VITAMIN D3) 125 MCG (5000 UT) CAPS Take 1 capsule (5,000 Units total) by mouth daily.   Continuous Blood Gluc Receiver (FREESTYLE LIBRE 2 READER) DEVI As directed   Continuous Blood Gluc Sensor (FREESTYLE LIBRE 2 SENSOR) MISC 1 Piece by Does not apply route every 14 (fourteen) days.   diltiazem (CARDIZEM CD) 360 MG 24 hr capsule TAKE 1 CAPSULE(360 MG) BY MOUTH DAILY    divalproex (DEPAKOTE ER) 500 MG 24 hr tablet 500 mg in AM, 1000 mg at night   ELDERBERRY PO Take 4 g by mouth daily. Gummies 2g each   EPINEPHrine 0.3 mg/0.3 mL IJ SOAJ injection Inject 0.3 mg into the muscle once  as needed for anaphylaxis.   FASENRA PEN 30 MG/ML SOAJ INJECT $RemoveBef'30MG'[id number]$  SUBCUTANEOUSLY  EVERY 8 WEEKS (GIVEN AT MD  OFFICE)   ferrous sulfate 324 MG TBEC Take 324 mg by mouth daily with breakfast.   fluticasone (FLOVENT HFA) 220 MCG/ACT inhaler 2 puffs twice daily with spacer2 puffs twice daily with spacer   gabapentin (NEURONTIN) 300 MG capsule Take 300 mg by mouth 3 (three) times daily.    hydrALAZINE (APRESOLINE) 25 MG tablet Take 25 mg by mouth 3 (three) times daily.   hydrOXYzine (ATARAX/VISTARIL) 25 MG tablet Take 1 tablet (25 mg total) by mouth every 6 (six) hours as needed for anxiety or nausea.   Immune Globulin, Human, 4 GM/20ML SOLN Inject 100 mLs into the skin every 14 (fourteen) days.   insulin lispro (HUMALOG KWIKPEN) 200 UNIT/ML KwikPen Inject 2-10 Units into the skin 3 (three) times daily before meals. Glucose 151-200 take 2 units, glucose 201-250 take 4 units, glucose 251-300 take 6 units, glucose 301-350 take 8 units, glucose 251-400 take 10 units   Insulin Pen Needle (PEN NEEDLES 3/16") 31G X 5 MM MISC Use as directed with insulin pen   LANTUS SOLOSTAR 100 UNIT/ML Solostar Pen Inject 40 Units into the skin at bedtime.   levothyroxine (SYNTHROID, LEVOTHROID) 112 MCG tablet Take 112 mcg by mouth daily before breakfast.   lubiprostone (AMITIZA) 24 MCG capsule Take 1 capsule (24 mcg total) by mouth 2 (two) times daily with a meal.   Magnesium Oxide 400 MG CAPS Take 1 capsule (400 mg total) by mouth daily.   Melatonin 3 MG TABS Take 3 mg by mouth at bedtime.   metoCLOPramide (REGLAN) 5 MG tablet Take 5 mg by mouth 3 (three) times daily.   montelukast (SINGULAIR) 10 MG tablet Take 1 tablet by mouth daily.   Multiple Vitamins-Minerals (MULTIVITAMIN WITH MINERALS) tablet Take 1  tablet by mouth daily. Woman   Nebulizer MISC Nebulizer tubing kit   omeprazole (PRILOSEC) 20 MG capsule TAKE 1 CAPSULE(20 MG) BY MOUTH TWICE DAILY BEFORE A MEAL   ondansetron (ZOFRAN) 4 MG tablet Take 1 tablet (4 mg total) by mouth every 8 (eight) hours as needed for nausea or vomiting.   ONETOUCH ULTRA test strip 1 each by Other route in the morning, at noon, in the evening, and at bedtime.   polyethylene glycol (MIRALAX / GLYCOLAX) 17 g packet Take 17 g by mouth daily as needed.   Potassium Chloride ER 20 MEQ TBCR Take 20 mEq by mouth daily.   Tiotropium Bromide Monohydrate (SPIRIVA RESPIMAT) 1.25 MCG/ACT AERS Inhale 2 puffs into the lungs daily.   topiramate (TOPAMAX) 100 MG tablet Take 1 tablet (100 mg total) by mouth 2 (two) times daily.   torsemide (DEMADEX) 20 MG tablet Take 1 tablet daily. May take additional tablet for fluid retention as needed.   triamcinolone cream (KENALOG) 0.1 % Apply 1 application topically 2 (two) times daily.   TRULICITY 9.24 MQ/2.8MN SOPN ADMINISTER 0.75 MG UNDER THE SKIN 1 TIME A WEEK   vitamin B-12 (CYANOCOBALAMIN) 1000 MCG tablet Take 1,000 mcg by mouth daily.   vitamin C (ASCORBIC ACID) 500 MG tablet Take 500 mg by mouth daily. Power C immune support   Facility-Administered Encounter Medications as of 05/21/2021  Medication   Benralizumab SOSY 30 mg    ALLERGIES: Allergies  Allergen Reactions   Ativan [Lorazepam] Hives   Phenergan [Promethazine Hcl] Hives    VACCINATION STATUS: Immunization History  Administered Date(s) Administered   Influenza Split  08/24/2018   Influenza Whole 07/15/2019   Influenza,inj,Quad PF,6+ Mos 07/10/2017   Moderna Sars-Covid-2 Vaccination 12/31/2019, 01/30/2020   Pneumococcal-Unspecified 10/10/2012    Diabetes She presents for her follow-up diabetic visit. She has type 2 diabetes mellitus. Onset time: She was diagnosed at approximate age of 36 years, stated by gestational diabetes with all 3 of her pregnancies in  her 60s. Her disease course has been improving (Since her last visit, she fell and injured herself.  CT scan of the head did not show any intracranial bleeding.Marland Kitchen). There are no hypoglycemic associated symptoms. Pertinent negatives for hypoglycemia include no confusion, headaches, pallor or seizures. Associated symptoms include fatigue. Pertinent negatives for diabetes include no blurred vision, no chest pain, no polydipsia, no polyphagia and no polyuria. There are no hypoglycemic complications. Symptoms are improving. Diabetic complications include a CVA. (She has history of open heart surgery for ASD in 1965.) Risk factors for coronary artery disease include dyslipidemia, diabetes mellitus, family history, obesity, hypertension, post-menopausal, sedentary lifestyle and tobacco exposure. Current diabetic treatments: She is currently on Lantus 45 units nightly, Humalog 7 units 3 times daily AC, Metformin 1000 g p.o. twice daily, Trulicity 1.5 mg subcutaneou 50 units nig. Her weight is stable. She is following a generally unhealthy diet. When asked about meal planning, she reported none. She has not had a previous visit with a dietitian. She rarely (She has medical problem with bronchiectasis for which she gets treated with steroids on and off.) participates in exercise. Her home blood glucose trend is increasing steadily. Her breakfast blood glucose range is generally 140-180 mg/dl. Her lunch blood glucose range is generally 140-180 mg/dl. Her dinner blood glucose range is generally 140-180 mg/dl. Her bedtime blood glucose range is generally 140-180 mg/dl. Her overall blood glucose range is 140-180 mg/dl. (Her A1c in June was 6.8% consistent with significant improvement from 7.7%.  However, for the last 14 days she ran higher glycemic profile averaging 213 due to her recent vacation.  She did not have any hypoglycemia.  Her CGM analysis shows 38% time range, 71% above range.     ) An ACE inhibitor/angiotensin II  receptor blocker is being taken.  Hyperlipidemia This is a chronic problem. The current episode started more than 1 year ago. Exacerbating diseases include diabetes and obesity. Associated symptoms include myalgias. Pertinent negatives include no chest pain or shortness of breath. Current antihyperlipidemic treatment includes statins. Risk factors for coronary artery disease include dyslipidemia, diabetes mellitus, family history, obesity, hypertension, a sedentary lifestyle and post-menopausal.  Hypertension This is a chronic problem. The current episode started more than 1 year ago. The problem is uncontrolled. Pertinent negatives include no blurred vision, chest pain, headaches, palpitations or shortness of breath. Risk factors for coronary artery disease include dyslipidemia, diabetes mellitus, obesity, sedentary lifestyle and smoking/tobacco exposure. Past treatments include ACE inhibitors. Hypertensive end-organ damage includes CVA.  Other The current episode started more than 1 month ago. The problem occurs constantly. Progression since onset: Patient reports that she did have exposure to steroids related to back pain on 2 separate occasions between November 2000 21 and 2022.  Last exposure was related to her COVID infection.  She reports that she developed fatigueced that she developed fatigue. Associated symptoms include fatigue and myalgias. Pertinent negatives include no arthralgias, chest pain, chills, coughing, fever, headaches, nausea, rash or vomiting. She has tried nothing for the symptoms. The treatment provided no relief.     Objective:    BP 122/60   Pulse 68  Ht 5\' 4"  (1.626 m)   Wt 199 lb 12.8 oz (90.6 kg)   BMI 34.30 kg/m   Wt Readings from Last 3 Encounters:  05/21/21 199 lb 12.8 oz (90.6 kg)  04/27/21 205 lb 9.6 oz (93.3 kg)  04/23/21 204 lb (92.5 kg)    + Bruise and hematoma on her forehead and left periorbital area.  This is related to her recent fall.  CT scan of  the head did not show any intracranial bleeding.  Has no recent CMP, A1c, thyroid function test, lipid panel to review. Recent Results (from the past 2160 hour(s))  Basic metabolic panel     Status: Abnormal   Collection Time: 04/07/21  6:14 PM  Result Value Ref Range   Sodium 135 135 - 145 mmol/L   Potassium 4.2 3.5 - 5.1 mmol/L   Chloride 101 98 - 111 mmol/L   CO2 22 22 - 32 mmol/L   Glucose, Bld 455 (H) 70 - 99 mg/dL    Comment: Glucose reference range applies only to samples taken after fasting for at least 8 hours.   BUN 32 (H) 6 - 20 mg/dL   Creatinine, Ser 04/09/21 (H) 0.44 - 1.00 mg/dL   Calcium 9.1 8.9 - 0.96 mg/dL   GFR, Estimated 23.1 >70 mL/min    Comment: (NOTE) Calculated using the CKD-EPI Creatinine Equation (2021)    Anion gap 12 5 - 15    Comment: Performed at [facility], [address] [place], [place]  CBC     Status: None   Collection Time: 04/07/21  6:14 PM  Result Value Ref Range   WBC 4.1 4.0 - 10.5 K/uL   RBC 4.48 3.87 - 5.11 MIL/uL   Hemoglobin 14.6 12.0 - 15.0 g/dL   HCT 04/09/21 69.6 - 87.7 %   MCV 92.0 80.0 - 100.0 fL   MCH 32.6 26.0 - 34.0 pg   MCHC 35.4 30.0 - 36.0 g/dL   RDW 00.7 13.0 - 14.7 %   Platelets 193 150 - 400 K/uL   nRBC 0.0 0.0 - 0.2 %    Comment: Performed at [facility], [address], [place], [place]  Troponin I (High Sensitivity)     Status: None   Collection Time: 04/07/21  6:14 PM  Result Value Ref Range   Troponin I (High Sensitivity) 14 <18 ng/L    Comment: (NOTE) Elevated high sensitivity troponin I (hsTnI) values and significant  changes across serial measurements may suggest ACS but many other  chronic and acute conditions are known to elevate hsTnI results.  Refer to the "Links" section for chest pain algorithms and additional  guidance. Performed at [facility], [address] [place], [place]   Brain natriuretic peptide     Status: None   Collection Time: 04/07/21  7:07 PM  Result Value  Ref Range   B Natriuretic Peptide 75.0 0.0 - 100.0 pg/mL    Comment: Performed at [facility], [address] [place], [place]  Troponin I (High Sensitivity)     Status: None   Collection Time: 04/07/21  8:07 PM  Result Value Ref Range   Troponin I (High Sensitivity) 15 <18 ng/L    Comment: (NOTE) Elevated high sensitivity troponin I (hsTnI) values and significant  changes across serial measurements may suggest ACS but many other  chronic and acute conditions are known to elevate hsTnI results.  Refer to the "Links" section for chest pain algorithms and additional  guidance. Performed at [facility], [address] [place], [place] [address]   Resp Panel by RT-PCR (Flu A&B, Covid) Nasopharyngeal Swab     Status: None   Collection Time: 04/07/21  8:21 PM   Specimen: Nasopharyngeal Swab; Nasopharyngeal(NP) swabs in vial transport medium  Result Value Ref Range   SARS Coronavirus 2 by RT PCR NEGATIVE NEGATIVE    Comment: (NOTE) SARS-CoV-2 target nucleic acids are NOT DETECTED.  The SARS-CoV-2 RNA is generally detectable in upper respiratory specimens during the acute phase of infection. The lowest concentration of SARS-CoV-2 viral copies this assay can detect is 138 copies/mL. A negative result does not preclude SARS-Cov-2 infection and should not be used as the sole basis for treatment or other patient management decisions. A negative result may occur with  improper specimen collection/handling, submission of specimen other than nasopharyngeal swab, presence of viral mutation(s) within the areas targeted by this assay, and inadequate number of viral copies(<138 copies/mL). A negative result must be combined with clinical observations, patient history, and epidemiological information. The expected result is Negative.  Fact Sheet for Patients:  EntrepreneurPulse.com.au  Fact Sheet for Healthcare Providers:   IncredibleEmployment.be  This test is no t yet approved or cleared by the [place] FDA and  has been authorized for detection and/or diagnosis of SARS-CoV-2 by FDA under an Emergency Use Authorization (EUA). This EUA will remain  in effect (meaning this test can be used) for the duration of the COVID-19 declaration under Section 564(b)(1) of the Act, 21 U.S.C.section 360bbb-3(b)(1), unless the authorization is terminated  or revoked sooner.       Influenza A by PCR NEGATIVE NEGATIVE   Influenza B by PCR NEGATIVE NEGATIVE    Comment: (NOTE) The Xpert Xpress SARS-CoV-2/FLU/RSV plus assay is intended as an aid in the diagnosis of influenza from Nasopharyngeal swab specimens and should not be used as a sole basis for treatment. Nasal washings and aspirates are unacceptable for Xpert Xpress SARS-CoV-2/FLU/RSV testing.  Fact Sheet for Patients: EntrepreneurPulse.com.au  Fact Sheet for Healthcare Providers: IncredibleEmployment.be  This test is not yet approved or cleared by the [place] FDA and has been authorized for detection and/or diagnosis of SARS-CoV-2 by FDA under an Emergency Use Authorization (EUA). This EUA will remain in effect (meaning this test can be used) for the duration of the COVID-19 declaration under Section 564(b)(1) of the Act, 21 U.S.C. section 360bbb-3(b)(1), unless the authorization is terminated or revoked.  Performed at [facility], [address], [place], [place] [address]   HIV Antibody (routine testing w rflx)     Status: None   Collection Time: 04/08/21  5:36 AM  Result Value Ref Range   HIV Screen 4th Generation wRfx Non Reactive Non Reactive    Comment: Performed at [facility], [place] [address] [place], [place] [address]  Comprehensive metabolic panel     Status: Abnormal   Collection Time: 04/08/21  5:36 AM  Result Value Ref Range   Sodium 130 (L) 135 - 145 mmol/L    Potassium 3.8 3.5 - 5.1 mmol/L   Chloride 97 (L) 98 - 111 mmol/L   CO2 22 22 - 32 mmol/L   Glucose, Bld 592 (HH) 70 - 99 mg/dL    Comment: Glucose reference range applies only to samples taken after fasting for at least 8 hours. CRITICAL RESULT CALLED TO, READ BACK BY AND VERIFIED WITH: REYNOLDS,C AT 7:05AM ON 04/08/21 BY FESTERMAN,C    BUN 30 (H) 6 - 20  mg/dL   Creatinine, Ser 0.82 0.44 - 1.00 mg/dL   Calcium 8.8 (L) 8.9 - 10.3 mg/dL   Total Protein 6.6 6.5 - 8.1 g/dL   Albumin 3.6 3.5 - 5.0 g/dL   AST 13 (L) 15 - 41 U/L   ALT 14 0 - 44 U/L   Alkaline Phosphatase 77 38 - 126 U/L   Total Bilirubin 0.6 0.3 - 1.2 mg/dL   GFR, Estimated >60 >60 mL/min    Comment: (NOTE) Calculated using the CKD-EPI Creatinine Equation (2021)    Anion gap 11 5 - 15    Comment: Performed at [facility], [address], [place], [place] [address]  CBC     Status: Abnormal   Collection Time: 04/08/21  5:36 AM  Result Value Ref Range   WBC 3.2 (L) 4.0 - 10.5 K/uL   RBC 3.82 (L) 3.87 - 5.11 MIL/uL   Hemoglobin 12.5 12.0 - 15.0 g/dL   HCT 35.9 (L) 36.0 - 46.0 %   MCV 94.0 80.0 - 100.0 fL   MCH 32.7 26.0 - 34.0 pg   MCHC 34.8 30.0 - 36.0 g/dL   RDW 12.9 11.5 - 15.5 %   Platelets 162 150 - 400 K/uL   nRBC 0.0 0.0 - 0.2 %    Comment: Performed at [facility], [address] [place], [place] [address]  Protime-INR     Status: None   Collection Time: 04/08/21  5:36 AM  Result Value Ref Range   Prothrombin Time 14.6 11.4 - 15.2 seconds   INR 1.1 0.8 - 1.2    Comment: (NOTE) INR goal varies based on device and disease states. Performed at [facility], [address] [place], [place] [address]   APTT     Status: None   Collection Time: 04/08/21  5:36 AM  Result Value Ref Range   aPTT 26 24 - 36 seconds    Comment: Performed at [facility], [address], [place], [place] [address]  Magnesium     Status: None   Collection Time: 04/08/21  5:36 AM  Result Value Ref Range   Magnesium 2.1 1.7 - 2.4  mg/dL    Comment: Performed at [facility], [address] [place], [place] [address]  Phosphorus     Status: None   Collection Time: 04/08/21  5:36 AM  Result Value Ref Range   Phosphorus 3.0 2.5 - 4.6 mg/dL    Comment: Performed at [name], [address] [place], [place] [address]  Hemoglobin A1c     Status: Abnormal   Collection Time: 04/08/21  5:36 AM  Result Value Ref Range   Hgb A1c MFr Bld 6.8 (H) 4.8 - 5.6 %    Comment: (NOTE)         Prediabetes: 5.7 - 6.4         Diabetes: >6.4         Glycemic control for adults with diabetes: <7.0    Mean Plasma Glucose 148 mg/dL    Comment: (NOTE) Performed At: [facility] [place], [place] [id number] Rush Farmer MD PP:[id number]   Glucose, capillary     Status: Abnormal   Collection Time: 04/08/21  7:53 AM  Result Value Ref Range   Glucose-Capillary 577 (HH) 70 - 99 mg/dL    Comment: Glucose reference range applies only to samples taken after fasting for at least 8 hours.   Comment 1 Notify RN   Glucose, capillary     Status: Abnormal  Collection Time: 04/08/21 10:14 AM  Result Value Ref Range   Glucose-Capillary 438 (H) 70 - 99 mg/dL    Comment: Glucose reference range applies only to samples taken after fasting for at least 8 hours.  Glucose, capillary     Status: Abnormal   Collection Time: 04/08/21 12:03 PM  Result Value Ref Range   Glucose-Capillary 317 (H) 70 - 99 mg/dL    Comment: Glucose reference range applies only to samples taken after fasting for at least 8 hours.  Troponin I (High Sensitivity)     Status: Abnormal   Collection Time: 04/09/21 10:02 PM  Result Value Ref Range   Troponin I (High Sensitivity) 19 (H) <18 ng/L    Comment: (NOTE) Elevated high sensitivity troponin I (hsTnI) values and significant  changes across serial measurements may suggest ACS but many other  chronic and acute conditions are known to elevate hsTnI results.  Refer to the "Links" section for chest  pain algorithms and additional  guidance. Performed at [facility] [place] [facility], [address], [place], [place] [address]   CBC with Differential     Status: Abnormal   Collection Time: 04/09/21 10:02 PM  Result Value Ref Range   WBC 8.3 4.0 - 10.5 K/uL   RBC 4.11 3.87 - 5.11 MIL/uL   Hemoglobin 13.5 12.0 - 15.0 g/dL   HCT 70.0 05.7 - 44.9 %   MCV 92.9 80.0 - 100.0 fL   MCH 32.8 26.0 - 34.0 pg   MCHC 35.3 30.0 - 36.0 g/dL   RDW 07.6 27.0 - 17.8 %   Platelets 233 150 - 400 K/uL   nRBC 0.0 0.0 - 0.2 %   Neutrophils Relative % 73 %   Neutro Abs 6.1 1.7 - 7.7 K/uL   Lymphocytes Relative 19 %   Lymphs Abs 1.6 0.7 - 4.0 K/uL   Monocytes Relative 5 %   Monocytes Absolute 0.4 0.1 - 1.0 K/uL   Eosinophils Relative 0 %   Eosinophils Absolute 0.0 0.0 - 0.5 K/uL   Basophils Relative 1 %   Basophils Absolute 0.1 0.0 - 0.1 K/uL   Immature Granulocytes 2 %   Abs Immature Granulocytes 0.14 (H) 0.00 - 0.07 K/uL    Comment: Performed at [facility], [address], [place], [place] [address]  Basic metabolic panel     Status: Abnormal   Collection Time: 04/09/21 10:02 PM  Result Value Ref Range   Sodium 136 135 - 145 mmol/L   Potassium 3.9 3.5 - 5.1 mmol/L   Chloride 103 98 - 111 mmol/L   CO2 25 22 - 32 mmol/L   Glucose, Bld 370 (H) 70 - 99 mg/dL    Comment: Glucose reference range applies only to samples taken after fasting for at least 8 hours.   BUN 22 (H) 6 - 20 mg/dL   Creatinine, Ser 7.91 0.44 - 1.00 mg/dL   Calcium 8.9 8.9 - 42.1 mg/dL   GFR, Estimated >29 >24 mL/min    Comment: (NOTE) Calculated using the CKD-EPI Creatinine Equation (2021)    Anion gap 8 5 - 15    Comment: Performed at [facility], [address], [place], [place] [address]  Hepatic function panel     Status: None   Collection Time: 04/09/21 10:02 PM  Result Value Ref Range   Total Protein 6.7 6.5 - 8.1 g/dL   Albumin 3.8 3.5 - 5.0 g/dL   AST 19 15 - 41 U/L  ALT 19 0 - 44 U/L   Alkaline  Phosphatase 77 38 - 126 U/L   Total Bilirubin 0.7 0.3 - 1.2 mg/dL   Bilirubin, Direct 0.1 0.0 - 0.2 mg/dL   Indirect Bilirubin 0.6 0.3 - 0.9 mg/dL    Comment: Performed at [facility] [address] [place], [place] [address]  CBG monitoring, ED     Status: Abnormal   Collection Time: 04/09/21 10:10 PM  Result Value Ref Range   Glucose-Capillary 351 (H) 70 - 99 mg/dL    Comment: Glucose reference range applies only to samples taken after fasting for at least 8 hours.  Troponin I (High Sensitivity)     Status: Abnormal   Collection Time: 04/10/21 12:02 AM  Result Value Ref Range   Troponin I (High Sensitivity) 20 (H) <18 ng/L    Comment: (NOTE) Elevated high sensitivity troponin I (hsTnI) values and significant  changes across serial measurements may suggest ACS but many other  chronic and acute conditions are known to elevate hsTnI results.  Refer to the "Links" section for chest pain algorithms and additional  guidance. Performed at [facility], [place] [address], [place], [place] [address]   SARS CORONAVIRUS 2 (TAT 6-24 HRS) Nasopharyngeal Nasopharyngeal Swab     Status: None   Collection Time: 04/10/21 12:28 AM   Specimen: Nasopharyngeal Swab  Result Value Ref Range   SARS Coronavirus 2 NEGATIVE NEGATIVE    Comment: (NOTE) SARS-CoV-2 target nucleic acids are NOT DETECTED.  The SARS-CoV-2 RNA is generally detectable in upper and lower respiratory specimens during the acute phase of infection. Negative results do not preclude SARS-CoV-2 infection, do not rule out co-infections with other pathogens, and should not be used as the sole basis for treatment or other patient management decisions. Negative results must be combined with clinical observations, patient history, and epidemiological information. The expected result is Negative.  Fact Sheet for Patients: SugarRoll.be  Fact Sheet for Healthcare  Providers: https://www.woods-mathews.com/  This test is not yet approved or cleared by the [place] FDA and  has been authorized for detection and/or diagnosis of SARS-CoV-2 by FDA under an Emergency Use Authorization (EUA). This EUA will remain  in effect (meaning this test can be used) for the duration of the COVID-19 declaration under Se ction 564(b)(1) of the Act, 21 U.S.C. section 360bbb-3(b)(1), unless the authorization is terminated or revoked sooner.  Performed at [facility], [place] [address], [place], [place] [address]   Valproic acid level     Status: Abnormal   Collection Time: 04/10/21 12:38 AM  Result Value Ref Range   Valproic Acid Lvl 27 (L) 50.0 - 100.0 ug/mL    Comment: Performed at [facility] [address] [place], [place] [address]  Ammonia     Status: Abnormal   Collection Time: 04/10/21  2:43 AM  Result Value Ref Range   Ammonia 37 (H) 9 - 35 umol/L    Comment: Performed at [facility], [place] [address], [name], [place] [address]  Magnesium     Status: None   Collection Time: 04/10/21  2:44 AM  Result Value Ref Range   Magnesium 1.9 1.7 - 2.4 mg/dL    Comment: Performed at [facility] [address] [place], [place] [address]  TSH     Status: None   Collection Time: 04/10/21  2:44 AM  Result Value Ref Range   TSH 2.048 0.350 - 4.500 uIU/mL  Comment: Performed by a 3rd Generation assay with a functional sensitivity of <=0.01 uIU/mL. Performed at [facility], [place] [address], [place], [place] [address]   Glucose, capillary     Status: Abnormal   Collection Time: 04/10/21  4:32 AM  Result Value Ref Range   Glucose-Capillary 243 (H) 70 - 99 mg/dL    Comment: Glucose reference range applies only to samples taken after fasting for at least 8 hours.  Comprehensive metabolic panel     Status: Abnormal   Collection Time: 04/10/21  5:00 AM  Result Value Ref Range   Sodium 139 135 - 145 mmol/L   Potassium  3.6 3.5 - 5.1 mmol/L   Chloride 105 98 - 111 mmol/L   CO2 24 22 - 32 mmol/L   Glucose, Bld 264 (H) 70 - 99 mg/dL    Comment: Glucose reference range applies only to samples taken after fasting for at least 8 hours.   BUN 20 6 - 20 mg/dL   Creatinine, Ser 0.87 0.44 - 1.00 mg/dL   Calcium 9.1 8.9 - 10.3 mg/dL   Total Protein 6.3 (L) 6.5 - 8.1 g/dL   Albumin 3.6 3.5 - 5.0 g/dL   AST 17 15 - 41 U/L   ALT 15 0 - 44 U/L   Alkaline Phosphatase 64 38 - 126 U/L   Total Bilirubin 0.5 0.3 - 1.2 mg/dL   GFR, Estimated >60 >60 mL/min    Comment: (NOTE) Calculated using the CKD-EPI Creatinine Equation (2021)    Anion gap 10 5 - 15    Comment: Performed at [facility] [address], [place], [place] [address]  Magnesium     Status: None   Collection Time: 04/10/21  5:00 AM  Result Value Ref Range   Magnesium 1.8 1.7 - 2.4 mg/dL    Comment: Performed at [facility] [address] [place], [place] [address]  CBC WITH DIFFERENTIAL     Status: Abnormal   Collection Time: 04/10/21  5:00 AM  Result Value Ref Range   WBC 7.8 4.0 - 10.5 K/uL   RBC 3.91 3.87 - 5.11 MIL/uL   Hemoglobin 12.9 12.0 - 15.0 g/dL   HCT 36.1 36.0 - 46.0 %   MCV 92.3 80.0 - 100.0 fL   MCH 33.0 26.0 - 34.0 pg   MCHC 35.7 30.0 - 36.0 g/dL   RDW 13.1 11.5 - 15.5 %   Platelets 200 150 - 400 K/uL   nRBC 0.0 0.0 - 0.2 %   Neutrophils Relative % 56 %   Neutro Abs 4.4 1.7 - 7.7 K/uL   Lymphocytes Relative 36 %   Lymphs Abs 2.8 0.7 - 4.0 K/uL   Monocytes Relative 6 %   Monocytes Absolute 0.5 0.1 - 1.0 K/uL   Eosinophils Relative 0 %   Eosinophils Absolute 0.0 0.0 - 0.5 K/uL   Basophils Relative 1 %   Basophils Absolute 0.1 0.0 - 0.1 K/uL   Immature Granulocytes 1 %   Abs Immature Granulocytes 0.11 (H) 0.00 - 0.07 K/uL    Comment: Performed at [facility] [address] [place], [place] [address]  Glucose, capillary     Status: Abnormal   Collection Time: 04/10/21  6:46 AM  Result Value Ref Range    Glucose-Capillary 316 (H) 70 - 99 mg/dL    Comment: Glucose reference range applies only to samples taken after fasting for at least 8 hours.  Glucose, capillary  Status: Abnormal   Collection Time: 04/10/21  7:44 AM  Result Value Ref Range   Glucose-Capillary 380 (H) 70 - 99 mg/dL    Comment: Glucose reference range applies only to samples taken after fasting for at least 8 hours.  Glucose, capillary     Status: Abnormal   Collection Time: 04/10/21 11:56 AM  Result Value Ref Range   Glucose-Capillary 324 (H) 70 - 99 mg/dL    Comment: Glucose reference range applies only to samples taken after fasting for at least 8 hours.  Glucose, capillary     Status: Abnormal   Collection Time: 04/10/21  3:33 PM  Result Value Ref Range   Glucose-Capillary 263 (H) 70 - 99 mg/dL    Comment: Glucose reference range applies only to samples taken after fasting for at least 8 hours.  Glucose, capillary     Status: None   Collection Time: 04/10/21  9:00 PM  Result Value Ref Range   Glucose-Capillary 89 70 - 99 mg/dL    Comment: Glucose reference range applies only to samples taken after fasting for at least 8 hours.  Glucose, capillary     Status: Abnormal   Collection Time: 04/11/21  7:36 AM  Result Value Ref Range   Glucose-Capillary 372 (H) 70 - 99 mg/dL    Comment: Glucose reference range applies only to samples taken after fasting for at least 8 hours.  Glucose, capillary     Status: Abnormal   Collection Time: 04/11/21 11:31 AM  Result Value Ref Range   Glucose-Capillary 445 (H) 70 - 99 mg/dL    Comment: Glucose reference range applies only to samples taken after fasting for at least 8 hours.  Glucose, capillary     Status: Abnormal   Collection Time: 04/11/21  3:44 PM  Result Value Ref Range   Glucose-Capillary 187 (H) 70 - 99 mg/dL    Comment: Glucose reference range applies only to samples taken after fasting for at least 8 hours.  Glucose, capillary     Status: Abnormal   Collection  Time: 04/11/21  9:23 PM  Result Value Ref Range   Glucose-Capillary 241 (H) 70 - 99 mg/dL    Comment: Glucose reference range applies only to samples taken after fasting for at least 8 hours.  Comprehensive metabolic panel     Status: Abnormal   Collection Time: 04/12/21  1:31 AM  Result Value Ref Range   Sodium 132 (L) 135 - 145 mmol/L   Potassium 4.2 3.5 - 5.1 mmol/L   Chloride 98 98 - 111 mmol/L   CO2 23 22 - 32 mmol/L   Glucose, Bld 313 (H) 70 - 99 mg/dL    Comment: Glucose reference range applies only to samples taken after fasting for at least 8 hours.   BUN 30 (H) 6 - 20 mg/dL   Creatinine, Ser 1.00 0.44 - 1.00 mg/dL   Calcium 8.8 (L) 8.9 - 10.3 mg/dL   Total Protein 5.9 (L) 6.5 - 8.1 g/dL   Albumin 3.1 (L) 3.5 - 5.0 g/dL   AST 14 (L) 15 - 41 U/L   ALT 16 0 - 44 U/L   Alkaline Phosphatase 60 38 - 126 U/L   Total Bilirubin 0.5 0.3 - 1.2 mg/dL   GFR, Estimated >60 >60 mL/min    Comment: (NOTE) Calculated using the CKD-EPI Creatinine Equation (2021)    Anion gap 11 5 - 15    Comment: Performed at [facility], [place],  [place] [address]  Glucose, capillary     Status: Abnormal   Collection Time: 04/12/21  7:37 AM  Result Value Ref Range   Glucose-Capillary 301 (H) 70 - 99 mg/dL    Comment: Glucose reference range applies only to samples taken after fasting for at least 8 hours.  Glucose, capillary     Status: Abnormal   Collection Time: 04/12/21 11:55 AM  Result Value Ref Range   Glucose-Capillary 203 (H) 70 - 99 mg/dL    Comment: Glucose reference range applies only to samples taken after fasting for at least 8 hours.  Glucose, capillary     Status: Abnormal   Collection Time: 04/12/21  4:38 PM  Result Value Ref Range   Glucose-Capillary 266 (H) 70 - 99 mg/dL    Comment: Glucose reference range applies only to samples taken after fasting for at least 8 hours.  Glucose, capillary     Status: Abnormal   Collection Time: 04/12/21  9:53 PM  Result  Value Ref Range   Glucose-Capillary 267 (H) 70 - 99 mg/dL    Comment: Glucose reference range applies only to samples taken after fasting for at least 8 hours.  Glucose, capillary     Status: Abnormal   Collection Time: 04/13/21  8:14 AM  Result Value Ref Range   Glucose-Capillary 373 (H) 70 - 99 mg/dL    Comment: Glucose reference range applies only to samples taken after fasting for at least 8 hours.  Glucose, capillary     Status: Abnormal   Collection Time: 04/13/21 11:27 AM  Result Value Ref Range   Glucose-Capillary 272 (H) 70 - 99 mg/dL    Comment: Glucose reference range applies only to samples taken after fasting for at least 8 hours.  Glucose, capillary     Status: Abnormal   Collection Time: 04/13/21  5:47 PM  Result Value Ref Range   Glucose-Capillary 180 (H) 70 - 99 mg/dL    Comment: Glucose reference range applies only to samples taken after fasting for at least 8 hours.  Glucose, capillary     Status: Abnormal   Collection Time: 04/13/21  9:29 PM  Result Value Ref Range   Glucose-Capillary 303 (H) 70 - 99 mg/dL    Comment: Glucose reference range applies only to samples taken after fasting for at least 8 hours.  Glucose, capillary     Status: Abnormal   Collection Time: 04/14/21  8:20 AM  Result Value Ref Range   Glucose-Capillary 232 (H) 70 - 99 mg/dL    Comment: Glucose reference range applies only to samples taken after fasting for at least 8 hours.  Glucose, capillary     Status: Abnormal   Collection Time: 04/14/21 11:56 AM  Result Value Ref Range   Glucose-Capillary 351 (H) 70 - 99 mg/dL    Comment: Glucose reference range applies only to samples taken after fasting for at least 8 hours.  Glucose, capillary     Status: Abnormal   Collection Time: 04/14/21  4:50 PM  Result Value Ref Range   Glucose-Capillary 257 (H) 70 - 99 mg/dL    Comment: Glucose reference range applies only to samples taken after fasting for at least 8 hours.  Glucose, capillary      Status: None   Collection Time: 04/14/21  9:53 PM  Result Value Ref Range   Glucose-Capillary 88 70 - 99 mg/dL    Comment: Glucose reference range applies only to samples taken after fasting for at least 8 hours.  Glucose, capillary     Status: Abnormal   Collection Time: 04/15/21  7:37 AM  Result Value Ref Range   Glucose-Capillary 246 (H) 70 - 99 mg/dL    Comment: Glucose reference range applies only to samples taken after fasting for at least 8 hours.  Glucose, capillary     Status: Abnormal   Collection Time: 04/15/21 11:42 AM  Result Value Ref Range   Glucose-Capillary 342 (H) 70 - 99 mg/dL    Comment: Glucose reference range applies only to samples taken after fasting for at least 8 hours.  Glucose, capillary     Status: Abnormal   Collection Time: 04/15/21  4:01 PM  Result Value Ref Range   Glucose-Capillary 230 (H) 70 - 99 mg/dL    Comment: Glucose reference range applies only to samples taken after fasting for at least 8 hours.  Glucose, capillary     Status: Abnormal   Collection Time: 04/15/21  9:05 PM  Result Value Ref Range   Glucose-Capillary 221 (H) 70 - 99 mg/dL    Comment: Glucose reference range applies only to samples taken after fasting for at least 8 hours.  Basic metabolic panel     Status: Abnormal   Collection Time: 04/16/21  3:05 AM  Result Value Ref Range   Sodium 134 (L) 135 - 145 mmol/L   Potassium 3.5 3.5 - 5.1 mmol/L   Chloride 96 (L) 98 - 111 mmol/L   CO2 27 22 - 32 mmol/L   Glucose, Bld 106 (H) 70 - 99 mg/dL    Comment: Glucose reference range applies only to samples taken after fasting for at least 8 hours.   BUN 28 (H) 6 - 20 mg/dL   Creatinine, Ser 0.78 0.44 - 1.00 mg/dL   Calcium 9.0 8.9 - 10.3 mg/dL   GFR, Estimated >60 >60 mL/min    Comment: (NOTE) Calculated using the CKD-EPI Creatinine Equation (2021)    Anion gap 11 5 - 15    Comment: Performed at [facility] [address], [place], [place] [address]  Glucose, capillary      Status: Abnormal   Collection Time: 04/16/21  7:43 AM  Result Value Ref Range   Glucose-Capillary 101 (H) 70 - 99 mg/dL    Comment: Glucose reference range applies only to samples taken after fasting for at least 8 hours.  Glucose, capillary     Status: Abnormal   Collection Time: 04/16/21 11:18 AM  Result Value Ref Range   Glucose-Capillary 212 (H) 70 - 99 mg/dL    Comment: Glucose reference range applies only to samples taken after fasting for at least 8 hours.  Glucose, capillary     Status: None   Collection Time: 04/16/21  4:06 PM  Result Value Ref Range   Glucose-Capillary 79 70 - 99 mg/dL    Comment: Glucose reference range applies only to samples taken after fasting for at least 8 hours.  Glucose, capillary     Status: Abnormal   Collection Time: 04/16/21  5:49 PM  Result Value Ref Range   Glucose-Capillary 117 (H) 70 - 99 mg/dL    Comment: Glucose reference range applies only to samples taken after fasting for at least 8 hours.  Glucose, capillary     Status: Abnormal   Collection Time: 04/16/21  9:34 PM  Result Value Ref Range   Glucose-Capillary 360 (H) 70 - 99 mg/dL    Comment: Glucose reference range applies only to samples taken after fasting for  at least 8 hours.  Glucose, capillary     Status: Abnormal   Collection Time: 04/17/21  7:27 AM  Result Value Ref Range   Glucose-Capillary 182 (H) 70 - 99 mg/dL    Comment: Glucose reference range applies only to samples taken after fasting for at least 8 hours.  Glucose, capillary     Status: None   Collection Time: 04/17/21 12:15 PM  Result Value Ref Range   Glucose-Capillary 70 70 - 99 mg/dL    Comment: Glucose reference range applies only to samples taken after fasting for at least 8 hours.  Comprehensive metabolic panel     Status: Abnormal   Collection Time: 05/20/21 10:46 AM  Result Value Ref Range   Sodium 138 135 - 145 mmol/L   Potassium 4.0 3.5 - 5.1 mmol/L   Chloride 109 98 - 111 mmol/L   CO2 23 22 - 32  mmol/L   Glucose, Bld 160 (H) 70 - 99 mg/dL    Comment: Glucose reference range applies only to samples taken after fasting for at least 8 hours.   BUN 20 6 - 20 mg/dL   Creatinine, Ser 0.72 0.44 - 1.00 mg/dL   Calcium 9.0 8.9 - 10.3 mg/dL   Total Protein 6.7 6.5 - 8.1 g/dL   Albumin 3.9 3.5 - 5.0 g/dL   AST 12 (L) 15 - 41 U/L   ALT 13 0 - 44 U/L   Alkaline Phosphatase 77 38 - 126 U/L   Total Bilirubin 0.6 0.3 - 1.2 mg/dL   GFR, Estimated >60 >60 mL/min    Comment: (NOTE) Calculated using the CKD-EPI Creatinine Equation (2021)    Anion gap 6 5 - 15    Comment: Performed at [facility], [address], [place], [place] [address]  Lipid panel     Status: None   Collection Time: 05/20/21 10:48 AM  Result Value Ref Range   Cholesterol 139 0 - 200 mg/dL   Triglycerides 112 <150 mg/dL   HDL 45 >40 mg/dL   Total CHOL/HDL Ratio 3.1 RATIO   VLDL 22 0 - 40 mg/dL   LDL Cholesterol 72 0 - 99 mg/dL    Comment:        Total Cholesterol/HDL:CHD Risk Coronary Heart Disease Risk Table                     Men   Women  1/2 Average Risk   3.4   3.3  Average Risk       5.0   4.4  2 X Average Risk   9.6   7.1  3 X Average Risk  23.4   11.0        Use the calculated Patient Ratio above and the CHD Risk Table to determine the patient's CHD Risk.        ATP III CLASSIFICATION (LDL):  <100     mg/dL   Optimal  100-129  mg/dL   Near or Above                    Optimal  130-159  mg/dL   Borderline  160-189  mg/dL   High  >190     mg/dL   Very High Performed at [facility] [place], [place] [address]   TSH     Status: None   Collection Time: 05/20/21 10:48 AM  Result Value Ref Range   TSH 1.068 0.350 - 4.500 uIU/mL  Comment: Performed by a 3rd Generation assay with a functional sensitivity of <=0.01 uIU/mL. Performed at [facility], [address] [place], [place] [address]   T4, free     Status: Abnormal   Collection Time: 05/20/21 10:48 AM  Result Value Ref Range    Free T4 1.23 (H) 0.61 - 1.12 ng/dL    Comment: (NOTE) Biotin ingestion may interfere with free T4 tests. If the results are inconsistent with the TSH level, previous test results, or the clinical presentation, then consider biotin interference. If needed, order repeat testing after stopping biotin. Performed at [facility], [place] [address] [place], [place] [address]      Assessment & Plan:     1. DM type 2 causing vascular disease ([name]) Her A1c in June was 6.8% consistent with significant improvement from 7.7%.  However, for the last 14 days she ran higher glycemic profile averaging 213 due to her recent vacation.  She did not have any hypoglycemia.  Her CGM analysis shows 38% time range, 71% above range.    -her diabetes is complicated by CVA, history of open heart surgery for ASD, obesity/sedentary life, history of chronic smoking with bronchiectasis and Laura [name] remains at a high risk for more acute and chronic complications which include CAD, CVA, CKD, retinopathy, and neuropathy. These are all discussed in detail with the patient.  - I have counseled her on diet management and weight loss, by adopting a carbohydrate restricted/protein rich diet.  - she acknowledges that there is a room for improvement in her food and drink choices. - Suggestion is made for her to avoid simple carbohydrates  from her diet including Cakes, Sweet Desserts, Ice Cream, Soda (diet and regular), Sweet Tea, Candies, Chips, Cookies, Store Bought Juices, Alcohol in Excess of  1-2 drinks a day, Artificial Sweeteners,  Coffee Creamer, and "Sugar-free" Products, Lemonade. This will help patient to have more stable blood glucose profile and potentially avoid unintended weight gain.   - she is advised to stick to a routine mealtimes to eat 3 meals  a day and avoid unnecessary snacks ( to snack only to correct hypoglycemia).   - she will be scheduled with Jearld Fenton, RDN, CDE for individualized  diabetes education.  - I have approached her with the following individualized plan to manage diabetes and patient agrees:   -She is promising to return to her healthier lifestyle and exercise is much as possible.   -In light of her recent hypoglycemia, she is advised to increase her Lantus to 50 units nightly,  associated with monitoring of blood glucose 4 times a day-daily before breakfast, lunch, supper, and at bedtime, and at any other time as needed using her CGM. - Patient is warned not to take insulin without proper monitoring per orders.  -Patient is encouraged to call clinic for blood glucose levels less than 70 or above 200 mg /dl. --Due to her vague GI concerns, she is advised to stay off of Metformin for now.  -She is tolerating her current dose of Trulicity.  I discussed and increase her Trulicity to 1.5 mg subcutaneously weekly.    She will not need prandial insulin for now.     - Patient specific target  A1c;  LDL, HDL, Triglycerides,  were discussed in detail.  2.  BP/HTN: Her blood pressure is controlled to target.  She advised to continue her current blood pressure medications including hydralazine 25 mg 3 times daily.     3) Lipids/HPL:  Her recent lipid panel showed LDL of 76, patient is on atorvastatin 10 mg p.o. nightly.  Advised to continue, side effects precautions discussed with her.     4)  Weight/Diet: Her BMI is 34.1, patient losing weight.   -She is working with her dietitian.     5 hypothyroidism-long-standing, etiology unclear, she has multiple family members with thyroid dysfunctions. -She does not have recent thyroid function test will be considered for next year.  She is advised to continue levothyroxine 112 mcg p.o. daily before breakfast.     - We discussed about the correct intake of her thyroid hormone, on empty stomach at fasting, with water, separated by at least 30 minutes from breakfast and other medications,  and separated by more than 4 hours  from calcium, iron, multivitamins, acid reflux medications (PPIs). -Patient is made aware of the fact that thyroid hormone replacement is needed for life, dose to be adjusted by periodic monitoring of thyroid function tests.     6) vitamin D deficiency: Her vitamin D 16.5.  She is currently on  vitamin D supplements including vitamin D3 5000 units daily.  She would like to be screened for magnesium, B12 deficiency.  She will have labs before next visit.   7) Chronic Care/Health Maintenance:  -she  is on  Statin medications and  is encouraged to continue to follow up with Ophthalmology, Dentist,  Podiatrist at least yearly or according to recommendations, and advised to  stay away from smoking. I have recommended yearly flu vaccine and pneumonia vaccination at least every 5 years;  and  sleep for at least 7 hours a day.  - I advised patient to maintain close follow up with [name], MD for primary care needs.     I spent 41 minutes in the care of the patient today including review of labs from [facility], Lipids, Thyroid Function, Hematology (current and previous including abstractions from other facilities); face-to-face time discussing  her blood glucose readings/logs, discussing hypoglycemia and hyperglycemia episodes and symptoms, medications doses, her options of short and long term treatment based on the latest standards of care / guidelines;  discussion about incorporating lifestyle medicine;  and documenting the encounter.    Please refer to Patient Instructions for Blood Glucose Monitoring and Insulin/Medications Dosing Guide"  in media tab for additional information. Please  also refer to " Patient Self Inventory" in the Media  tab for reviewed elements of pertinent patient history.  [name] participated in the discussions, expressed understanding, and voiced agreement with the above plans.  All questions were answered to her satisfaction. she is encouraged to contact clinic  should she have any questions or concerns prior to her return visit.    Follow up plan: - Return in about 3 months (around 08/21/2021) for Bring Meter and Logs- A1c in Office.  Glade Lloyd, MD [facility] [address] [place], [place] [address] Phone: [phone]  Fax: [phone]    05/21/2021, 11:36 AM  This note was partially dictated with voice recognition software. Similar sounding words can be transcribed inadequately or may not  be corrected upon review.

## 2021-05-24 ENCOUNTER — Other Ambulatory Visit: Payer: Self-pay | Admitting: Family

## 2021-05-27 ENCOUNTER — Other Ambulatory Visit: Payer: Self-pay | Admitting: Family

## 2021-05-28 NOTE — Telephone Encounter (Signed)
It looks like her albuterol was last filled 04/07/21. Does she need a refill?

## 2021-05-28 NOTE — Telephone Encounter (Signed)
Thank you :)

## 2021-06-01 DIAGNOSIS — M7711 Lateral epicondylitis, right elbow: Secondary | ICD-10-CM | POA: Diagnosis not present

## 2021-06-01 DIAGNOSIS — G5603 Carpal tunnel syndrome, bilateral upper limbs: Secondary | ICD-10-CM | POA: Diagnosis not present

## 2021-06-01 DIAGNOSIS — G5601 Carpal tunnel syndrome, right upper limb: Secondary | ICD-10-CM | POA: Diagnosis not present

## 2021-06-02 ENCOUNTER — Encounter: Payer: Self-pay | Admitting: Nutrition

## 2021-06-02 ENCOUNTER — Other Ambulatory Visit: Payer: Self-pay

## 2021-06-02 ENCOUNTER — Encounter: Payer: Medicare Other | Attending: "Endocrinology | Admitting: Nutrition

## 2021-06-02 VITALS — Ht 64.0 in | Wt 201.7 lb

## 2021-06-02 DIAGNOSIS — E669 Obesity, unspecified: Secondary | ICD-10-CM

## 2021-06-02 DIAGNOSIS — I10 Essential (primary) hypertension: Secondary | ICD-10-CM | POA: Diagnosis not present

## 2021-06-02 DIAGNOSIS — E782 Mixed hyperlipidemia: Secondary | ICD-10-CM | POA: Diagnosis not present

## 2021-06-02 DIAGNOSIS — E1165 Type 2 diabetes mellitus with hyperglycemia: Secondary | ICD-10-CM | POA: Diagnosis not present

## 2021-06-02 NOTE — Patient Instructions (Signed)
Goals  Eat three meals a day Walk 30 minutes daily. Drink water. Increase plant based foods. Get FBS less than 130 and Bedtime less than 180 mg/dl.

## 2021-06-02 NOTE — Progress Notes (Signed)
Medical Nutrition Therapy  Appointment Start time:  [phone] Appointment End time: 1015 Follow up   NUTRITION ASSESSMENT   Had been on vacation a few days last week. Just back into her meal routines again. Feels bloated. Admits to eating at night again. Just got a steriod injection. Had been in hospital for asthma./bronchitis last month and her AFIB issues. Had to go to [facility] for heart issues due to AFIB. Elenor Legato is on. 14 day avg 252 mg/dl. BS are in the 300-400's today. Hasn't eaten breakfast. TIR26%. 74% of BS are high. No low blood sugars. Discussed with [name] today and he requested her to increase to 60 units at night of Lantus daily now. Is also on Trulicity. Is taking CORE Vitamins and she says she feels much better when taking them. She notes she has been cooking better. Steaming vegetables. Eating more oatmeal and more vegetables. A1C 6.8% but it is up from the last month.  Trulicity. Is taking CORE vitamins.  Is frustrated with weight gain of a few pounds. But will get back to walking consistently.  Anthropometrics  Wt Readings from Last 3 Encounters:  06/02/21 201 lb 11.2 oz (91.5 kg)  05/21/21 199 lb 12.8 oz (90.6 kg)  04/27/21 205 lb 9.6 oz (93.3 kg)   Ht Readings from Last 3 Encounters:  06/02/21 '5\' 4"'$  (1.626 m)  05/21/21 '5\' 4"'$  (1.626 m)  04/27/21 '5\' 4"'$  (1.626 m)   Body mass index is 34.62 kg/m. '@BMIFA'$ @ Facility age limit for growth percentiles is 20 years. Facility age limit for growth percentiles is 20 years.   FBS: 128 mg/dl.  Range 124-128 mg/dl..  Clinical Medical Hx: CHF and DM,  Medications: Lantus 60 units a day now, Trulicity weekly.  Labs:  Lab Results  Component Value Date   HGBA1C 6.8 (H) 04/08/2021   CMP Latest Ref Rng & Units 05/20/2021 04/16/2021 04/12/2021  Glucose 70 - 99 mg/dL 160(H) 106(H) 313(H)  BUN 6 - 20 mg/dL 20 28(H) 30(H)  Creatinine 0.44 - 1.00 mg/dL 0.72 0.78 1.00  Sodium 135 - 145 mmol/L 138 134(L) 132(L)  Potassium 3.5 - 5.1 mmol/L  4.0 3.5 4.2  Chloride 98 - 111 mmol/L 109 96(L) 98  CO2 22 - 32 mmol/L '23 27 23  '$ Calcium 8.9 - 10.3 mg/dL 9.0 9.0 8.8(L)  Total Protein 6.5 - 8.1 g/dL 6.7 - 5.9(L)  Total Bilirubin 0.3 - 1.2 mg/dL 0.6 - 0.5  Alkaline Phos 38 - 126 U/L 77 - 60  AST 15 - 41 U/L 12(L) - 14(L)  ALT 0 - 44 U/L 13 - 16   Notable Signs/Symptoms:    Lifestyle & Dietary  Lives with her sister. Works at [employer].   Estimated daily fluid intake:  64 oz   Sleep: varies  Stress / self-care: stress eater Current average weekly physical activity: ADL  B) egg, avacado toast, L)  skipped: yogurt, water D) Meat and some veggies, water   Estimated Energy Needs Calories: 1200 Carbohydrate: 135g Protein: 90g Fat: 33g   NUTRITION DIAGNOSIS  NB-1.1 Food and nutrition-related knowledge deficit As related to Diabetes Type 2.  As evidenced by A1C 7.7%.   NUTRITION INTERVENTION  Nutrition education (E-1) on the following topics:  Nutrition and Diabetes education provided on My Plate, CHO counting, meal planning, portion sizes, timing of meals, avoiding snacks between meals unless having a low blood sugar, target ranges for A1C and blood sugars, signs/symptoms and treatment of hyper/hypoglycemia, monitoring blood sugars, taking medications as  prescribed, benefits of exercising 30 minutes per day and prevention of complications of DM.   Handouts Provided Include  .Nutrition Lifestyle handout  Learning Style & Readiness for Change Teaching method utilized: Visual & Auditory  Demonstrated degree of understanding via: Teach Back  Barriers to learning/adherence to lifestyle change: none  Goals Established by Pt Goals  Eat three meals a day Walk 30 minutes daily. Drink water. Increase plant based foods. Get FBS less than 130 and Bedtime less than 180 mg/dl.     MONITORING & EVALUATION Dietary intake, weekly physical activity, and blood sugars in 3 month. Increase Lantus to 60 units at night per  [name]..  Next Steps  Get back on track with meal planning and eating meals on time-more plant based foods. Referred her to [place] financial assistance for most recent hospital stay.

## 2021-06-04 ENCOUNTER — Telehealth: Payer: Self-pay | Admitting: Cardiology

## 2021-06-04 ENCOUNTER — Telehealth: Payer: Self-pay | Admitting: Allergy & Immunology

## 2021-06-04 MED ORDER — NIRMATRELVIR/RITONAVIR (PAXLOVID)TABLET: ORAL_TABLET | ORAL | 0 refills | Status: DC

## 2021-06-04 NOTE — Telephone Encounter (Signed)
Laura [name] called to let Dr. Ernst Bowler know that she has tested positive for Covid, per a home test. She cannot get in to see PCP until possibly Monday and it would be a virtual visit and possible testing from them. She said she is congested, has cough, low grade fever, nauseous and is having breathing difficulty. She said Dr. Ernst Bowler would usually call her in an antibiotic in this case and with it being the weekend, she hoped he could send something in for her.

## 2021-06-04 NOTE — Telephone Encounter (Signed)
Please provide recommendation in regards to the patient's symptoms.

## 2021-06-04 NOTE — Telephone Encounter (Signed)
Patient contact me directly on my cell and reported that she was positive to [place]. Confirmed pharmacy and sent in Marianne.   Salvatore Marvel, MD [facility] [place]

## 2021-06-04 NOTE — Telephone Encounter (Signed)
Patient called to let Dr. Harl Bowie know she has Covid again. She said she had back in [place] and it damaged her heart. She can be reached at [phone] if he wants to suggest anything. She has virtual appt 8/29 with PCP.

## 2021-06-07 ENCOUNTER — Telehealth: Payer: Self-pay

## 2021-06-07 DIAGNOSIS — U071 COVID-19: Secondary | ICD-10-CM | POA: Diagnosis not present

## 2021-06-07 MED ORDER — LINACLOTIDE 145 MCG PO CAPS: 145.0000 ug | ORAL_CAPSULE | Freq: Every day | ORAL | 3 refills | Status: DC

## 2021-06-07 NOTE — Telephone Encounter (Signed)
I sent in Linzess 182mg daily.

## 2021-06-07 NOTE — Addendum Note (Signed)
Addended by: Mahala Menghini on: 06/07/2021 01:28 PM   Modules accepted: Orders

## 2021-06-07 NOTE — Telephone Encounter (Signed)
Noted   Phoned the pt and advised regarding Amitiza not being approved and Linzess 145 mcg being sent in. Waiting to see if a PA will be needed.

## 2021-06-07 NOTE — Telephone Encounter (Signed)
Repeat infections and some of the new strains of COVID have tended to be less severe symptoms wise. I would have her f/u with her pcp for evaluation, if any lingering cardiac symptoms over the next 2-3 weeks could warrant some evaluation. With active infection would expect some degree of SOB, cough, even chest pains   Laura Abts MD

## 2021-06-07 NOTE — Telephone Encounter (Signed)
Laura [name] the pt's request for Amitiza has been denied x 2. I looked at what would be covered and it is Lactulose 10 gram and 20 gram. Linzess 145 mcg, movantik 25 mg tab. Please advise of which you would like to switch to?

## 2021-06-08 ENCOUNTER — Ambulatory Visit (INDEPENDENT_AMBULATORY_CARE_PROVIDER_SITE_OTHER): Payer: Medicare Other | Admitting: Clinical

## 2021-06-08 ENCOUNTER — Other Ambulatory Visit: Payer: Self-pay

## 2021-06-08 DIAGNOSIS — F4310 Post-traumatic stress disorder, unspecified: Secondary | ICD-10-CM | POA: Diagnosis not present

## 2021-06-08 NOTE — Telephone Encounter (Signed)
Patient informed and verbalized understanding of plan. 

## 2021-06-08 NOTE — Progress Notes (Signed)
Virtual Visit via Video Note   I connected with Laura [name] on 06/08/21 at  9:00 AM EDT by a video enabled telemedicine application and verified that I am speaking with the correct person using two identifiers.   Location: Patient: Home Provider: Office   I discussed the limitations of evaluation and management by telemedicine and the availability of in person appointments. The patient expressed understanding and agreed to proceed.       THERAPIST PROGRESS NOTE   Session Time: 9:00 AM-:9:30 AM   Participation Level: Active   Behavioral Response: CasualAlertDepressed   Type of Therapy: Individual Therapy   Treatment Goals addressed: Coping   Interventions: CBT, DBT, Solution Focused, Strength-based and Supportive   Summary: Laura [name] is a 60 y.o. female who presents with PTSD. The OPT therapist worked with the patient for her ongoing OPT treatment session. The OPT therapist utilized Motivational Interviewing to assist in creating therapeutic repore. The patient in the session was engaged and work in collaboration giving feedback about her triggers and symptoms over the past few weeks including learning that she has contracted COVID 19 and the patient notes she has been suffering from [place] symptoms since last Friday. The OPT therapist utilized Cognitive Behavioral Therapy through cognitive restructuring as well as worked with the patient on coping strategies to assist in management of mental health symptoms. The patient reviewed her health struggles currently noting she completed a at home COVID test Friday and found she was positive. The OPT therapist provided emotional support and validated the patients feelings as she was upset having another health set back with now learning she has COVID.  The patient is currently taking antibiotics to help combat the intensity of the COVID 19 symptoms. The OPT therapist placed emphasis on the patient keeping all upcoming health appointments and  adhering to ongoing health treatment recommendations.   Suicidal/Homicidal: Nowithout intent/plan   Therapist Response:The OPT therapist worked with the patient for the patients scheduled session. The patient was engaged in her session and gave feedback in relation to triggers, symptoms, and behavior responses over the past few weeks. The OPT therapist worked with the patient utilizing an in session Cognitive Behavioral Therapy exercise. The patient was responsive in the session and verbalized, "I was feeling sick on Friday so I took a at home COVID test and it was positive and reached out to my health care providers and I was able to get antibiotics to help but the symptoms have been difficult and I am feeling bad I have had fever and difficulty with eating". The patient additionally spoke about her difficulty as well with the next 2 weeks or so as this is the time around she lost her loved one. The OPT therapist provided psycho-education throughout the session including reviewing the link between physical health and mental health. The OPT therapist will continue treatment work with the patient in her next scheduled session   Plan: Return again in 2/3 weeks.   Diagnosis:      Axis I:Post Traumatic Stress Disorder                           Axis II: No diagnosis   I discussed the assessment and treatment plan with the patient. The patient was provided an opportunity to ask questions and all were answered. The patient agreed with the plan and demonstrated an understanding of the instructions.   The patient was advised to call back or seek an  in-person evaluation if the symptoms worsen or if the condition fails to improve as anticipated.   I provided 30 minutes of non-face-to-face time during this encounter.   [name], LCSW   06/08/2021

## 2021-06-10 NOTE — Telephone Encounter (Signed)
Called and left a voicemail asking for patient to return call to see how she is doing.  

## 2021-06-10 NOTE — Telephone Encounter (Signed)
I think I sent in Paxlovid. Can we call and see how she is feeling?   Salvatore Marvel, MD [facility] [place]

## 2021-06-11 NOTE — Telephone Encounter (Signed)
Patient called back and states that she is doing better. She states that her airways open. She states that she feels ok that her breathing is slow and she has a cough and runny nose but she is able to be up and move around and do household chores.

## 2021-06-17 ENCOUNTER — Other Ambulatory Visit: Payer: Self-pay

## 2021-06-17 ENCOUNTER — Encounter: Payer: Self-pay | Admitting: Pulmonary Disease

## 2021-06-17 ENCOUNTER — Ambulatory Visit (INDEPENDENT_AMBULATORY_CARE_PROVIDER_SITE_OTHER): Payer: Medicare Other | Admitting: Pulmonary Disease

## 2021-06-17 DIAGNOSIS — J4550 Severe persistent asthma, uncomplicated: Secondary | ICD-10-CM | POA: Diagnosis not present

## 2021-06-17 DIAGNOSIS — R918 Other nonspecific abnormal finding of lung field: Secondary | ICD-10-CM

## 2021-06-17 DIAGNOSIS — Z9989 Dependence on other enabling machines and devices: Secondary | ICD-10-CM

## 2021-06-17 DIAGNOSIS — D801 Nonfamilial hypogammaglobulinemia: Secondary | ICD-10-CM

## 2021-06-17 DIAGNOSIS — G4733 Obstructive sleep apnea (adult) (pediatric): Secondary | ICD-10-CM | POA: Diagnosis not present

## 2021-06-17 MED ORDER — FLUTICASONE FUROATE-VILANTEROL 200-25 MCG/INH IN AEPB: 1.0000 | INHALATION_SPRAY | Freq: Every day | RESPIRATORY_TRACT | 0 refills | Status: DC

## 2021-06-17 MED ORDER — FLUTICASONE FUROATE-VILANTEROL 100-25 MCG/INH IN AEPB: 1.0000 | INHALATION_SPRAY | Freq: Every day | RESPIRATORY_TRACT | 6 refills | Status: DC

## 2021-06-17 MED ORDER — FLUTICASONE FUROATE-VILANTEROL 100-25 MCG/INH IN AEPB: 1.0000 | INHALATION_SPRAY | Freq: Every day | RESPIRATORY_TRACT | 0 refills | Status: DC

## 2021-06-17 NOTE — Assessment & Plan Note (Signed)
Getting 2 weekly injections of immunoglobulin which she is self administering

## 2021-06-17 NOTE — Assessment & Plan Note (Signed)
She does not appear to be on an inhaled steroid/LABA.  Previously on [facility] but this seems to have fallen off her medication list since it was not covered by insurance.  Previously she has not had much improvement with Advair or Symbicort.  Hence we will try Breo, this appears to be covered under insurance and I have given her a sample and a prescription today.  If not we will have to revert to Symbicort. Emphasized need for an inhaled steroid as part of her regimen. She will continue on Spiriva for now. Fasenra every 8 weeks at being administered by the allergy office

## 2021-06-17 NOTE — Assessment & Plan Note (Signed)
Review of last CT angiogram chest 04/2021 done at the hospital shows the right lower lobe nodule has resolved. Right hilar lymph node 13 mm was noted which is likely reactive since she did have a bronchitis.  We will follow-up with a CT chest with contrast in 6 months which would be January 2023.  I explained this finding to her, she was very anxious

## 2021-06-17 NOTE — Assessment & Plan Note (Signed)
She obtained her CPAP machine from [place].  No download available for information about previous sleep studies or settings.  She is obtaining supplies from [place] We may have to reassess with sleep study in the future if unable to get prior records

## 2021-06-17 NOTE — Patient Instructions (Signed)
Sample & Rx for Breo 200 once daily - rinse mouth Continue on spiriva

## 2021-06-17 NOTE — Progress Notes (Signed)
Subjective:    Patient ID: Laura [name], female    DOB: November 12, 1960, 60 y.o.   MRN: [id number]  HPI  60 yo female with bronchiectasis, CVID, OSA who presents to establish care in [place] office Former smoker. 28 pack years. Quit in 2008. Allergy with Dr. Ernst Bowler - on subcu immunoglobulin injections q2weeks and Fasenra in [place] office.   PMH - ASD s/p repair, paroxysmal atrial fibrillation s/p DCCV, insulin-dependent diabetes mellitus, PTSD, bipolar disorder, hypothyroidism, hyperlipidemia, OSA on CPAP, essential hypertension, GERD CVID   Chief Complaint  Patient presents with   Consult    Patient states that she has a fib and is in heart failure and was in the hospital last month for 8 days and had an asthma attack triggered by the heat.    Previous patient of [name] and Loanne Drilling, last seen 01/2020 presents to reestablish care at the [facility] office -Last notes from pulmonary and allergy reviewed She had COVID infection Jan 2022 & again 8/26 and received paxlovid  She was hospitalized 6/29 for a day for asthma exacerbation, discharged on course of oral prednisone.  She was readmitted 7/1 for 8 days with asthma exacerbation and atrial fibrillation/RVR -discharged on oral Cardizem and apixaban Review discharge summary, chest x-ray independently reviewed, no infiltrates Swallow eval >> mild aspiration risk  Her breathing is now back to baseline  Significant tests/ events reviewed  CTA chest 03/2021 >> RT hilar LN 13 mm, no nodule  CTA 09/18/19 - No pulmonary embolism. Nodule ~74m in right lung base. Minimal right pleural effusion.  04/2021 esophagram >> small hiatal hernia   PFT: 04/2021 Spirometry ratio 70, FEV1 1.60/64%, FVC 2.8/71%  11/2020  Spirometry: results abnormal (FEV1: 1.61/64%, FVC: 2.14/66%, FEV1/FVC: 75%).  06/21/19 Spirometry FVC 2.2 (70%) FEV1 1.5 (64%) Ratio 68  Interpretation: Moderate obstructive lung defect is present   Past Medical History:   Diagnosis Date   Asthma    Atrial fibrillation (HCC)    Bipolar disorder (HCC)    [place]chiectasis ([place]    Carpal tunnel syndrome of right wrist    CHF (congestive heart failure) (HCC)    Complication of [place]    [place] an [place] combination of the pain meds and the anesthesia" she does not remember what happened but "I eneded up in the ICU for 5 days".   H/O congenital atrial septal defect (ASD) repair    Hypogammaglobulinemia ([place]    Hypothyroid    IgE deficiency (HCC)    Neuropathy    Neuropathy    Osteoarthritis    Pinched nerve in neck    PTSD (post-traumatic stress disorder)    Recurrent sinusitis    Short-term memory loss    Sleep apnea    TIA (transient ischemic attack)    Tremors of nervous system    seeing PCP for this.   Type 2 diabetes mellitus (HCC)    Urticaria    Wound infection     Past Surgical History:  Procedure Laterality Date   ASD REPAIR  1968   BIOPSY  01/26/2021   Procedure: BIOPSY;  Surgeon: CEloise Harman DO;  [facility];  Service: Endoscopy;;  gastric   CARDIAC SURGERY     CARDIOVERSION     CARDIOVERSION N/A 08/29/2018   Procedure: CARDIOVERSION;  Surgeon: BArnoldo Lenis MD;  [facility];  Service: Endoscopy;  Laterality: N/A;   [place]  COLONOSCOPY WITH PROPOFOL N/A 01/26/2021   Procedure: COLONOSCOPY WITH PROPOFOL;  Surgeon: [name], DO;  [facility];  Service: Endoscopy;  Laterality: N/A;  am appt, diabetic   ESOPHAGOGASTRODUODENOSCOPY (EGD) WITH PROPOFOL N/A 01/26/2021   Procedure: ESOPHAGOGASTRODUODENOSCOPY (EGD) WITH PROPOFOL;  Surgeon: [name], DO;  [facility];  Service: Endoscopy;  Laterality: N/A;   LEFT HEART CATH AND CORONARY ANGIOGRAPHY N/A 08/30/2019   Procedure: LEFT HEART CATH AND CORONARY ANGIOGRAPHY;  Surgeon: [name], MD;  Location: [facility];  Service:  Cardiovascular;  Laterality: N/A;   NASAL SINUS SURGERY  2017   POLYPECTOMY  01/26/2021   Procedure: POLYPECTOMY;  Surgeon: [name], DO;  [facility];  Service: Endoscopy;;  colon    Allergies  Allergen Reactions   Ativan [Lorazepam] Hives   Phenergan [Promethazine Hcl] Hives    Social History   Socioeconomic History   Marital status: Widowed    Spouse name: Not on file   Number of children: Not on file   Years of education: Not on file   Highest education level: Not on file  Occupational History   Not on file  Tobacco Use   Smoking status: Former    Packs/day: 1.00    Years: 28.00    Pack years: 28.00    Types: Cigarettes    Start date: 20    Quit date: 2008    Years since quitting: 14.6   Smokeless tobacco: Never  Vaping Use   Vaping Use: Never used  Substance and Sexual Activity   Alcohol use: Not Currently   Drug use: Not Currently   Sexual activity: Not Currently    Birth control/protection: Post-menopausal  Other Topics Concern   Not on file  Social History Narrative   Not on file   Social Determinants of Health   Financial Resource Strain: Medium Risk   Difficulty of Paying Living Expenses: Somewhat hard  Food Insecurity: Food Insecurity Present   Worried About Charity fundraiser in the Last Year: Often true   Arboriculturist in the Last Year: Often true  Transportation Needs: No Transportation Needs   Lack of Transportation (Medical): No   Lack of Transportation (Non-Medical): No  Physical Activity: Insufficiently Active   Days of Exercise per Week: 3 days   Minutes of Exercise per Session: 20 min  Stress: Stress Concern Present   Feeling of Stress : To some extent  Social Connections: Moderately Isolated   Frequency of Communication with Friends and Family: More than three times a week   Frequency of Social Gatherings with Friends and Family: More than three times a week   Attends Religious Services: 1 to 4 times per year    Active Member of [employer] or Organizations: No   Attends Archivist Meetings: Never   Marital Status: Widowed  Human resources officer Violence: Not At Risk   Fear of Current or Ex-Partner: No   Emotionally Abused: No   Physically Abused: No   Sexually Abused: No     Family History  Problem Relation Age of Onset   Hypertension Mother    Stroke Mother    Kidney disease Mother    Heart attack Mother    Alcohol abuse Mother    Other Father        ?colon cancer   Kidney disease Sister    Heart disease Brother    Stroke Maternal Aunt    Heart  disease Maternal Aunt    Hypertension Sister    Bipolar disorder Sister    Thyroid disease Sister    Prostate cancer Brother    Thyroid disease Daughter    Hashimoto's thyroiditis Daughter    Celiac disease Daughter    Allergic rhinitis Daughter    Asthma Neg Hx      Review of Systems  neg for any significant sore throat, dysphagia, itching, sneezing, nasal congestion or excess/ purulent secretions, fever, chills, sweats, unintended wt loss, pleuritic or exertional cp, hempoptysis, orthopnea pnd or change in chronic leg swelling. Also denies presyncope, palpitations, heartburn, abdominal pain, nausea, vomiting, diarrhea or change in bowel or urinary habits, dysuria,hematuria, rash, arthralgias, visual complaints, headache, numbness weakness or ataxia.     Objective:   Physical Exam  Gen. Pleasant, well-nourished, in no distress ENT - no thrush, no pallor/icterus,no post nasal drip Neck: No JVD, no thyromegaly, no carotid bruits Lungs: no use of accessory muscles, no dullness to percussion, clear without rales or rhonchi  Cardiovascular: Rhythm regular, heart sounds  normal, no murmurs or gallops, no peripheral edema Musculoskeletal: No deformities, no cyanosis or clubbing        Assessment & Plan:

## 2021-06-21 DIAGNOSIS — Z0189 Encounter for other specified special examinations: Secondary | ICD-10-CM | POA: Diagnosis not present

## 2021-06-22 ENCOUNTER — Ambulatory Visit (INDEPENDENT_AMBULATORY_CARE_PROVIDER_SITE_OTHER): Payer: Medicare Other | Admitting: Clinical

## 2021-06-22 ENCOUNTER — Other Ambulatory Visit: Payer: Self-pay

## 2021-06-22 DIAGNOSIS — F4310 Post-traumatic stress disorder, unspecified: Secondary | ICD-10-CM | POA: Diagnosis not present

## 2021-06-22 NOTE — Progress Notes (Signed)
5Virtual Visit via Video Note   I connected with Laura [name] on 06/22/21 at  10:00 AM EDT by a video enabled telemedicine application and verified that I am speaking with the correct person using two identifiers.   Location: Patient: Home Provider: Office   I discussed the limitations of evaluation and management by telemedicine and the availability of in person appointments. The patient expressed understanding and agreed to proceed.       THERAPIST PROGRESS NOTE   Session Time: 10:00 AM-:10:55 AM   Participation Level: Active   Behavioral Response: CasualAlertDepressed   Type of Therapy: Individual Therapy   Treatment Goals addressed: Coping   Interventions: CBT, DBT, Solution Focused, Strength-based and Supportive   Summary: Laura [name] is a 60 y.o. female who presents with PTSD. The OPT therapist worked with the patient for her ongoing OPT treatment session. The OPT therapist utilized Motivational Interviewing to assist in creating therapeutic repore. The patient in the session was engaged and work in collaboration giving feedback about her triggers and symptoms over the past few weeks including struggling from [place] 19 symptoms and going recently to her doctors office for follow-up her COVID test still came back positive. The OPT therapist utilized Cognitive Behavioral Therapy through cognitive restructuring as well as worked with the patient on coping strategies to assist in management of mental health symptoms. The patient reviewed her health struggles currently noting she has been trying to recover from [place] 19 and she has been getting better day by day, however, she is awaiting getting clearance from her doctors office to be able to move forward with other dental and health appointments. The OPT therapist provided emotional support and validated the patients feelings as she was upset having ongoing health struggle with still having COVID. The OPT therapist placed emphasis on  the patient keeping all upcoming health appointments and adhering to ongoing health treatment recommendations.   Suicidal/Homicidal: Nowithout intent/plan   Therapist Response:The OPT therapist worked with the patient for the patients scheduled session. The patient was engaged in her session and gave feedback in relation to triggers, symptoms, and behavior responses over the past few weeks. The OPT therapist worked with the patient utilizing an in session Cognitive Behavioral Therapy exercise. The patient was responsive in the session and verbalized, "I am out of quarantine period , however, I still am testing positive which is keeping me from being able to move forward with my other health appointments". The patient did see her pulmonologist, however, her dentist appointment has had to be put off again. The patient noted even though she is out of the quarantine period she is sceptical about being around other people or involved with upcoming Fall festivals due to how she is still feeling and still testing positive for COVID.The OPT therapist provided psycho-education throughout the session including reviewing the link between physical health and mental health. The OPT therapist will continue treatment work with the patient in her next scheduled session   Plan: Return again in 2/3 weeks.   Diagnosis:      Axis I:Post Traumatic Stress Disorder                           Axis II: No diagnosis   I discussed the assessment and treatment plan with the patient. The patient was provided an opportunity to ask questions and all were answered. The patient agreed with the plan and demonstrated an understanding of the instructions.   The  patient was advised to call back or seek an in-person evaluation if the symptoms worsen or if the condition fails to improve as anticipated.   I provided 55 minutes of non-face-to-face time during this encounter.   [name], LCSW   06/22/2021

## 2021-06-25 ENCOUNTER — Telehealth: Payer: Self-pay | Admitting: Allergy & Immunology

## 2021-06-25 MED ORDER — PREDNISONE 10 MG PO TABS: ORAL_TABLET | ORAL | 0 refills | Status: DC

## 2021-06-25 NOTE — Telephone Encounter (Signed)
Patient called and was having several days of worsening sinus congestion and cough. She has had two COVID negative tests. Therefore I sent in prednisone to see if this would help to break things up.   Salvatore Marvel, MD [facility]

## 2021-06-28 NOTE — Progress Notes (Signed)
H&P  Chief Complaint: Urinary leakage  History of Present Illness: Laura [name] is a 60 y.o. year old female with multiple medical issues, on multiple medications, presents with a several year history of urinary frequency, urgency, urgency incontinence.  No frequent urinary tract infections.  She has a good voiding stream.  She does not feel like she has incomplete emptying.  She has not seen a urologist before.  She has not been on any medical therapy.  Past Medical History:  Diagnosis Date   Asthma    Atrial fibrillation ([place])    Bipolar disorder ([place])    Bronchiectasis ([place])    Carpal tunnel syndrome of right wrist    CHF (congestive heart failure) (HCC)    Complication of anesthesia    "my Dr in [place] told me I had an allergic reaction to the combination of the pain meds and the anesthesia" she does not remember what happened but "I eneded up in the ICU for 5 days".   H/O congenital atrial septal defect (ASD) repair    Hypogammaglobulinemia ([place])    Hypothyroid    IgE deficiency (HCC)    Neuropathy    Neuropathy    Osteoarthritis    Pinched nerve in neck    PTSD (post-traumatic stress disorder)    Recurrent sinusitis    Short-term memory loss    Sleep apnea    TIA (transient ischemic attack)    Tremors of nervous system    seeing PCP for this.   Type 2 diabetes mellitus (HCC)    Urticaria    Wound infection     Past Surgical History:  Procedure Laterality Date   ASD REPAIR  1968   BIOPSY  01/26/2021   Procedure: BIOPSY;  Surgeon: [name], DO;  [facility];  Service: Endoscopy;;  gastric   CARDIAC SURGERY     CARDIOVERSION     CARDIOVERSION N/A 08/29/2018   Procedure: CARDIOVERSION;  Surgeon: Arnoldo Lenis, MD;  [facility];  Service: Endoscopy;  Laterality: N/A;   [place]   COLONOSCOPY WITH PROPOFOL N/A 01/26/2021   Procedure: COLONOSCOPY WITH PROPOFOL;  Surgeon: [name], DO;  [facility]  [facility];  Service: Endoscopy;  Laterality: N/A;  am appt, diabetic   ESOPHAGOGASTRODUODENOSCOPY (EGD) WITH PROPOFOL N/A 01/26/2021   Procedure: ESOPHAGOGASTRODUODENOSCOPY (EGD) WITH PROPOFOL;  Surgeon: [name], DO;  [facility];  Service: Endoscopy;  Laterality: N/A;   LEFT HEART CATH AND CORONARY ANGIOGRAPHY N/A 08/30/2019   Procedure: LEFT HEART CATH AND CORONARY ANGIOGRAPHY;  Surgeon: [name], MD;  Location: [facility];  Service: Cardiovascular;  Laterality: N/A;   NASAL SINUS SURGERY  2017   POLYPECTOMY  01/26/2021   Procedure: POLYPECTOMY;  Surgeon: [name], DO;  [facility];  Service: Endoscopy;;  colon    Home Medications:  (Not in a hospital admission)   Allergies:  Allergies  Allergen Reactions   Ativan [Lorazepam] Hives   Phenergan [Promethazine Hcl] Hives    Family History  Problem Relation Age of Onset   Hypertension Mother    Stroke Mother    Kidney disease Mother    Heart attack Mother    Alcohol abuse Mother    Other Father        ?colon cancer   Kidney disease Sister    Heart disease Brother    Stroke Maternal Aunt    Heart disease Maternal  Aunt    Hypertension Sister    Bipolar disorder Sister    Thyroid disease Sister    Prostate cancer Brother    Thyroid disease Daughter    Hashimoto's thyroiditis Daughter    Celiac disease Daughter    Allergic rhinitis Daughter    Asthma Neg Hx     Social History:  reports that she quit smoking about 14 years ago. Her smoking use included cigarettes. She started smoking about 42 years ago. She has a 28.00 pack-year smoking history. She has never used smokeless tobacco. She reports that she does not currently use alcohol. She reports that she does not currently use drugs.  ROS: A complete review of systems was performed.  All systems are negative except for pertinent findings as noted.  Physical Exam:  Vital signs in last 24  hours: '@VSRANGES'$ @ General:  Alert and oriented, No acute distress HEENT: Normocephalic, atraumatic Neck: No JVD or lymphadenopathy Cardiovascular: Regular rate  Lungs: Normal inspiratory/expiratory excursion Extremities: No edema Neurologic: Grossly intact  I have reviewed prior pt notes  I have reviewed notes from referring/previous physicians  I have reviewed urinalysis results  Bladder scan volume 0   Impression/Assessment:  Overactive bladder-wet  She empties well  Plan:  1.  I gave her an overactive bladder sheet.  We will start with her performing behavioral modification for the next 3 weeks  2.  Following that, I gave her Gemtesa 75 mg to start in 3 weeks  3.  I will see her back in about 6 weeks.  If no significant improvement or any issues with medication, consider other management i.e. PTNS  Lillette Boxer Tyon Cerasoli 06/28/2021, 8:42 PM  Lillette Boxer. Asma Boldon MD

## 2021-06-29 ENCOUNTER — Ambulatory Visit (INDEPENDENT_AMBULATORY_CARE_PROVIDER_SITE_OTHER): Payer: Medicare Other | Admitting: Urology

## 2021-06-29 ENCOUNTER — Other Ambulatory Visit: Payer: Self-pay

## 2021-06-29 ENCOUNTER — Encounter: Payer: Self-pay | Admitting: Urology

## 2021-06-29 VITALS — BP 108/45 | HR 65 | Ht 64.0 in | Wt 200.0 lb

## 2021-06-29 DIAGNOSIS — R350 Frequency of micturition: Secondary | ICD-10-CM

## 2021-06-29 DIAGNOSIS — R3915 Urgency of urination: Secondary | ICD-10-CM | POA: Diagnosis not present

## 2021-06-29 DIAGNOSIS — R32 Unspecified urinary incontinence: Secondary | ICD-10-CM

## 2021-06-29 LAB — MICROSCOPIC EXAMINATION
RBC, Urine: NONE SEEN /hpf (ref 0–2)
Renal Epithel, UA: NONE SEEN /hpf

## 2021-06-29 LAB — URINALYSIS, ROUTINE W REFLEX MICROSCOPIC
Bilirubin, UA: NEGATIVE
Glucose, UA: NEGATIVE
Ketones, UA: NEGATIVE
Nitrite, UA: NEGATIVE
Protein,UA: NEGATIVE
RBC, UA: NEGATIVE
Specific Gravity, UA: 1.015 (ref 1.005–1.030)
Urobilinogen, Ur: 0.2 mg/dL (ref 0.2–1.0)
pH, UA: 6 (ref 5.0–7.5)

## 2021-06-29 LAB — BLADDER SCAN AMB NON-IMAGING: Scan Result: 0

## 2021-06-29 NOTE — Progress Notes (Signed)
Urological Symptom Review  Patient is experiencing the following symptoms: Frequent urination Hard to postpone urination Get up at night to urinate Leakage of urine Stream starts and stops   Review of Systems  Gastrointestinal (upper)  : Negative for upper GI symptoms  Gastrointestinal (lower) : Diarrhea Constipation  Constitutional : Weight loss Fatigue  Skin: Negative for skin symptoms  Eyes: Negative for eye symptoms  Ear/Nose/Throat : Negative for Ear/Nose/Throat symptoms  Hematologic/Lymphatic: Negative for Hematologic/Lymphatic symptoms  Cardiovascular : Negative for cardiovascular symptoms  Respiratory : Cough Shortness of breath  Endocrine: Negative for endocrine symptoms  Musculoskeletal: Back pain  Neurological: Headaches Dizziness  Psychologic: Depression Anxiety

## 2021-07-01 ENCOUNTER — Other Ambulatory Visit: Payer: Self-pay | Admitting: "Endocrinology

## 2021-07-01 DIAGNOSIS — M7712 Lateral epicondylitis, left elbow: Secondary | ICD-10-CM | POA: Diagnosis not present

## 2021-07-01 DIAGNOSIS — G5603 Carpal tunnel syndrome, bilateral upper limbs: Secondary | ICD-10-CM | POA: Diagnosis not present

## 2021-07-01 DIAGNOSIS — G5601 Carpal tunnel syndrome, right upper limb: Secondary | ICD-10-CM | POA: Diagnosis not present

## 2021-07-04 ENCOUNTER — Other Ambulatory Visit: Payer: Self-pay | Admitting: "Endocrinology

## 2021-07-07 ENCOUNTER — Ambulatory Visit: Payer: Medicare Other | Admitting: Allergy & Immunology

## 2021-07-13 ENCOUNTER — Other Ambulatory Visit: Payer: Self-pay

## 2021-07-13 ENCOUNTER — Ambulatory Visit (INDEPENDENT_AMBULATORY_CARE_PROVIDER_SITE_OTHER): Payer: Medicare Other | Admitting: Clinical

## 2021-07-13 DIAGNOSIS — F4310 Post-traumatic stress disorder, unspecified: Secondary | ICD-10-CM
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
## 2021-07-14 ENCOUNTER — Encounter: Payer: Self-pay | Admitting: *Deleted

## 2021-07-14 ENCOUNTER — Ambulatory Visit (INDEPENDENT_AMBULATORY_CARE_PROVIDER_SITE_OTHER): Payer: Medicare Other | Admitting: Cardiology

## 2021-07-14 ENCOUNTER — Encounter: Payer: Self-pay | Admitting: Cardiology

## 2021-07-14 VITALS — BP 120/70 | HR 64 | Ht 64.0 in | Wt 200.4 lb

## 2021-07-14 DIAGNOSIS — E039 Hypothyroidism, unspecified: Secondary | ICD-10-CM | POA: Diagnosis not present

## 2021-07-14 DIAGNOSIS — I5032 Chronic diastolic (congestive) heart failure: Secondary | ICD-10-CM

## 2021-07-14 DIAGNOSIS — I480 Paroxysmal atrial fibrillation: Secondary | ICD-10-CM

## 2021-07-14 DIAGNOSIS — E559 Vitamin D deficiency, unspecified: Secondary | ICD-10-CM | POA: Diagnosis not present

## 2021-07-14 DIAGNOSIS — I10 Essential (primary) hypertension: Secondary | ICD-10-CM | POA: Diagnosis not present

## 2021-07-14 DIAGNOSIS — E1165 Type 2 diabetes mellitus with hyperglycemia: Secondary | ICD-10-CM | POA: Diagnosis not present

## 2021-07-14 MED ORDER — BISOPROLOL FUMARATE 5 MG PO TABS: 2.5000 mg | ORAL_TABLET | Freq: Every day | ORAL | 3 refills | Status: DC

## 2021-07-14 MED ORDER — APIXABAN 5 MG PO TABS: 5.0000 mg | ORAL_TABLET | Freq: Two times a day (BID) | ORAL | 3 refills | Status: DC

## 2021-07-14 NOTE — Progress Notes (Signed)
Clinical Summary Laura [name] is a 60 y.o.female seen today for follow up of the following medical problems.    1. LE edema/Chronic diastolic HF - 23/7628 echo LVEF 60-65%, elevated LVEDP, mild RV dysfunction, mild to mod LAE, - has been on torsemide 60mg  daily   - working on weight loss, dieting. Down to 228 lbs. Mainly drinks a healthy drink mix she created on her own.  - some swelling at times, controlled with torsemide overall.      11/2020 echo LVEF 31-51%, indet diastolic, low normal RV function - swelling doing well. Taking torsemide 60mg  and controls edema.  - most recent labs Cr was normal. Home weights 201 lbs     2. PAF - some palpitations at times.  - compliant with meds    - admit 04/17/2021 with afib with RVR in setting of asthma exacerbation - started on dilt gtt, transitioned to oral.  - ongoing palpitations   - last visit we added bisoprolol 2.5mg  daily for ongoign palpitaitons - occasional palpitations though overall mild. Just finsished course of prednisonse.  - no bleeding on eliquis     3. Prior ASD repair   4. Asthma/Bronchitis - recent admission 04/2021 - followed by Dr Elsworth Soho   5. COVID + in Jan 2022 - recurrent infection 05/2021       Social history: She moved to [place] in 2019 from [place], [place], where she had lived with her daughter for 3 years.  She is originally from [facility].  She has a daughter who lives in [place]), a daughter who lives in [place], and a daughter who lives in [place].  She is currently living with her sister. She is widowed.  She was married for 30 years.  Her husband died of bilateral pulmonary embolism, he also had lung cancer and a brain tumor and a history of non-Hodgkin's lymphoma.   Recent trips [place] to visit her daughter.   Past Medical History:  Diagnosis Date   Asthma    Atrial fibrillation ([place])    Bipolar disorder ([place])    Bronchiectasis ([place])    Carpal tunnel syndrome  of right wrist    CHF (congestive heart failure) (HCC)    Complication of anesthesia    "my Dr in [place] told me I had an allergic reaction to the combination of the pain meds and the anesthesia" she does not remember what happened but "I eneded up in the ICU for 5 days".   H/O congenital atrial septal defect (ASD) repair    Hypogammaglobulinemia ([place])    Hypothyroid    IgE deficiency (HCC)    Neuropathy    Neuropathy    Osteoarthritis    Pinched nerve in neck    PTSD (post-traumatic stress disorder)    Recurrent sinusitis    Short-term memory loss    Sleep apnea    TIA (transient ischemic attack)    Tremors of nervous system    seeing PCP for this.   Type 2 diabetes mellitus (HCC)    Urticaria    Wound infection      Allergies  Allergen Reactions   Ativan [Lorazepam] Hives   Phenergan [Promethazine Hcl] Hives     Current Outpatient Medications  Medication Sig Dispense Refill   acetaminophen (TYLENOL) 325 MG tablet Take 2 tablets (650 mg total) by mouth every 6 (six) hours as needed for mild pain, fever or headache. 30 tablet 1   albuterol (PROVENTIL) (2.5 MG/3ML) 0.083%  nebulizer solution Take 3 mLs (2.5 mg total) by nebulization every 4 (four) hours as needed for wheezing or shortness of breath. 75 mL 1   albuterol (VENTOLIN HFA) 108 (90 Base) MCG/ACT inhaler INHALE 2 PUFFS INTO THE LUNGS EVERY 6 HOURS AS NEEDED FOR WHEEZING OR SHORTNESS OF BREATH 18 g 1   apixaban (ELIQUIS) 5 MG TABS tablet Take 5 mg by mouth 2 (two) times daily.     atorvastatin (LIPITOR) 10 MG tablet Take 10 mg by mouth every evening.      BD PEN NEEDLE NANO 2ND GEN 32G X 4 MM MISC 1 each by Other route in the morning, at noon, in the evening, and at bedtime.     bisoprolol (ZEBETA) 5 MG tablet Take 0.5 tablets (2.5 mg total) by mouth daily. 45 tablet 3   blood glucose meter kit and supplies 1 each by Other route 4 (four) times daily. Dispense based on patient and insurance preference. Use up to four  times daily as directed. (FOR ICD-10 E10.9, E11.9). 1 each 0   Cholecalciferol (VITAMIN D3) 125 MCG (5000 UT) CAPS Take 1 capsule (5,000 Units total) by mouth daily. 90 capsule 0   Continuous Blood Gluc Receiver (FREESTYLE LIBRE 2 READER) DEVI As directed 1 each 0   Continuous Blood Gluc Sensor (FREESTYLE LIBRE 2 SENSOR) MISC 1 Piece by Does not apply route every 14 (fourteen) days. 2 each 3   diltiazem (CARDIZEM CD) 360 MG 24 hr capsule TAKE 1 CAPSULE(360 MG) BY MOUTH DAILY 90 capsule 3   divalproex (DEPAKOTE ER) 500 MG 24 hr tablet 500 mg in AM, 1000 mg at night 270 tablet 0   ELDERBERRY PO Take 4 g by mouth daily. Gummies 2g each     EPINEPHrine 0.3 mg/0.3 mL IJ SOAJ injection Inject 0.3 mg into the muscle once as needed for anaphylaxis.     FASENRA PEN 30 MG/ML SOAJ INJECT $RemoveBef'30MG'DQnWoKziFw$  SUBCUTANEOUSLY  EVERY 8 WEEKS (GIVEN AT MD  OFFICE) 1 mL 6   ferrous sulfate 324 MG TBEC Take 324 mg by mouth daily with breakfast.     fluticasone (FLOVENT HFA) 220 MCG/ACT inhaler 2 puffs twice daily with spacer2 puffs twice daily with spacer 1 each 1   fluticasone furoate-vilanterol (BREO ELLIPTA) 100-25 MCG/INH AEPB Inhale 1 puff into the lungs daily. 60 each 0   gabapentin (NEURONTIN) 300 MG capsule Take 300 mg by mouth 3 (three) times daily.      hydrALAZINE (APRESOLINE) 25 MG tablet Take 25 mg by mouth 3 (three) times daily.     hydrOXYzine (ATARAX/VISTARIL) 25 MG tablet Take 1 tablet (25 mg total) by mouth every 6 (six) hours as needed for anxiety or nausea. 30 tablet 0   Immune Globulin, Human, 4 GM/20ML SOLN Inject 100 mLs into the skin every 14 (fourteen) days.     Insulin Pen Needle (PEN NEEDLES 3/16") 31G X 5 MM MISC Use as directed with insulin pen 100 each 0   LANTUS SOLOSTAR 100 UNIT/ML Solostar Pen INJECT 50 UNITS INTO THE SKIN ONCE DAILY 45 mL 0   levothyroxine (SYNTHROID, LEVOTHROID) 112 MCG tablet Take 112 mcg by mouth daily before breakfast.     linaclotide (LINZESS) 145 MCG CAPS capsule Take 1  capsule (145 mcg total) by mouth daily before breakfast. 90 capsule 3   Melatonin 3 MG TABS Take 3 mg by mouth at bedtime.     metoCLOPramide (REGLAN) 5 MG tablet Take 5 mg by mouth 3 (three) times daily.  montelukast (SINGULAIR) 10 MG tablet Take 1 tablet by mouth daily.     Multiple Vitamins-Minerals (MULTIVITAMIN WITH MINERALS) tablet Take 1 tablet by mouth daily. Woman     Nebulizer MISC Nebulizer tubing kit 2 each 5   omeprazole (PRILOSEC) 20 MG capsule TAKE 1 CAPSULE(20 MG) BY MOUTH TWICE DAILY BEFORE A MEAL 60 capsule 5   ondansetron (ZOFRAN) 4 MG tablet Take 1 tablet (4 mg total) by mouth every 8 (eight) hours as needed for nausea or vomiting. 20 tablet 0   ONETOUCH ULTRA test strip 1 each by Other route in the morning, at noon, in the evening, and at bedtime.     polyethylene glycol (MIRALAX / GLYCOLAX) 17 g packet Take 17 g by mouth daily as needed. 14 each 0   Potassium Chloride ER 20 MEQ TBCR Take 20 mEq by mouth daily.     predniSONE (DELTASONE) 10 MG tablet Take two tablets ($RemoveBefo'20mg'CiHeyROZtPj$ ) twice daily for three days, then one tablet ($RemoveBef'10mg'SnlrSwIsUZ$ ) twice daily for three days, then STOP. 18 tablet 0   Tiotropium Bromide Monohydrate (SPIRIVA RESPIMAT) 1.25 MCG/ACT AERS Inhale 2 puffs into the lungs daily. 4 g 1   topiramate (TOPAMAX) 100 MG tablet Take 1 tablet (100 mg total) by mouth 2 (two) times daily. 180 tablet 1   torsemide (DEMADEX) 20 MG tablet Take 1 tablet daily. May take additional tablet for fluid retention as needed. 90 tablet 3   triamcinolone cream (KENALOG) 0.1 % Apply 1 application topically 2 (two) times daily.     vitamin B-12 (CYANOCOBALAMIN) 1000 MCG tablet Take 1,000 mcg by mouth daily.     vitamin C (ASCORBIC ACID) 500 MG tablet Take 500 mg by mouth daily. Power C immune support     No current facility-administered medications for this visit.     Past Surgical History:  Procedure Laterality Date   ASD REPAIR  1968   BIOPSY  01/26/2021   Procedure: BIOPSY;  Surgeon:  [name], DO;  [facility]: [facility];  Service: Endoscopy;;  gastric   CARDIAC SURGERY     CARDIOVERSION     CARDIOVERSION N/A 08/29/2018   Procedure: CARDIOVERSION;  Surgeon: Arnoldo Lenis, MD;  Location: [facility];  Service: Endoscopy;  Laterality: N/A;   [place]   COLONOSCOPY WITH PROPOFOL N/A 01/26/2021   Procedure: COLONOSCOPY WITH PROPOFOL;  Surgeon: [name], DO;  [facility]: [facility];  Service: Endoscopy;  Laterality: N/A;  am appt, diabetic   ESOPHAGOGASTRODUODENOSCOPY (EGD) WITH PROPOFOL N/A 01/26/2021   Procedure: ESOPHAGOGASTRODUODENOSCOPY (EGD) WITH PROPOFOL;  Surgeon: [name], DO;  [facility];  Service: Endoscopy;  Laterality: N/A;   LEFT HEART CATH AND CORONARY ANGIOGRAPHY N/A 08/30/2019   Procedure: LEFT HEART CATH AND CORONARY ANGIOGRAPHY;  Surgeon: [name], MD;  Location: [facility];  Service: Cardiovascular;  Laterality: N/A;   NASAL SINUS SURGERY  2017   POLYPECTOMY  01/26/2021   Procedure: POLYPECTOMY;  Surgeon: [name], DO;  [facility];  Service: Endoscopy;;  colon     Allergies  Allergen Reactions   Ativan [Lorazepam] Hives   Phenergan [Promethazine Hcl] Hives      Family History  Problem Relation Age of Onset   Hypertension Mother    Stroke Mother    Kidney disease Mother    Heart attack Mother    Alcohol abuse Mother    Other Father        ?  colon cancer   Kidney disease Sister    Heart disease Brother    Stroke Maternal Aunt    Heart disease Maternal Aunt    Hypertension Sister    Bipolar disorder Sister    Thyroid disease Sister    Prostate cancer Brother    Thyroid disease Daughter    Hashimoto's thyroiditis Daughter    Celiac disease Daughter    Allergic rhinitis Daughter    Asthma Neg Hx      Social History Ms. Kasler reports that she quit smoking about 14 years ago. Her smoking use included cigarettes. She started  smoking about 42 years ago. She has a 28.00 pack-year smoking history. She has never used smokeless tobacco. Ms. Egle reports that she does not currently use alcohol.   Review of Systems CONSTITUTIONAL: No weight loss, fever, chills, weakness or fatigue.  HEENT: Eyes: No visual loss, blurred vision, double vision or yellow sclerae.No hearing loss, sneezing, congestion, runny nose or sore throat.  SKIN: No rash or itching.  CARDIOVASCULAR: per hpi RESPIRATORY: No shortness of breath, cough or sputum.  GASTROINTESTINAL: No anorexia, nausea, vomiting or diarrhea. No abdominal pain or blood.  GENITOURINARY: No burning on urination, no polyuria NEUROLOGICAL: No headache, dizziness, syncope, paralysis, ataxia, numbness or tingling in the extremities. No change in bowel or bladder control.  MUSCULOSKELETAL: No muscle, back pain, joint pain or stiffness.  LYMPHATICS: No enlarged nodes. No history of splenectomy.  PSYCHIATRIC: No history of depression or anxiety.  ENDOCRINOLOGIC: No reports of sweating, cold or heat intolerance. No polyuria or polydipsia.  Marland Kitchen   Physical Examination Today's Vitals   07/14/21 1255  BP: 120/70  Pulse: 64  SpO2: 98%  Weight: 200 lb 6.4 oz (90.9 kg)  Height: $Remove'5\' 4"'cBRSDyS$  (1.626 m)   Body mass index is 34.4 kg/m.  Gen: resting comfortably, no acute distress HEENT: no scleral icterus, pupils equal round and reactive, no palptable cervical adenopathy,  CV: RRR, no mr/g no jvd Resp: Clear to auscultation bilaterally GI: abdomen is soft, non-tender, non-distended, normal bowel sounds, no hepatosplenomegaly MSK: extremities are warm, no edema.  Skin: warm, no rash Neuro:  no focal deficits Psych: appropriate affect   Diagnostic Studies  08/2019 cath LV end diastolic pressure is mildly elevated. There is no aortic valve stenosis. Aortic atherosclerosis/calcification noted. No angiographically apparently CAD.     08/2019 echo 1. Left ventricular ejection  fraction, by visual estimation, is 60 to  65%. The left ventricle has normal function. There is mildly increased  left ventricular hypertrophy.   2. Elevated left ventricular end-diastolic pressure.   3. Left ventricular diastolic function could not be evaluated.   4. Global right ventricle has mildly reduced systolic function.The right  ventricular size is normal. No increase in right ventricular wall  thickness.   5. Left atrial size was mild-moderately dilated.   6. Right atrial size was normal.   7. Moderate to severe mitral annular calcification.   8. The mitral valve is degenerative. Trace mitral valve regurgitation.   9. The tricuspid valve is grossly normal. Tricuspid valve regurgitation  is trivial.  10. The aortic valve is tricuspid. Aortic valve regurgitation is not  visualized. Mild aortic valve sclerosis without stenosis.  11. The pulmonic valve was grossly normal. Pulmonic valve regurgitation is  not visualized.  12. The inferior vena cava is normal in size with greater than 50%  respiratory variability, suggesting right atrial pressure of 3 mmHg.   Assessment and Plan  1. LE edema/Chronic  diatolic HF -overall controlled edema, continue torsemide 60mg  daily   2. PAF - mild palpitatioins at times, just finsished a course of prednisone. Overall symptoms are mild - continue diltiazem and bisoprolol, room to titrate bisoprolol if needed - continue eliquis for stroke prevention      Arnoldo Lenis, M.D.

## 2021-07-14 NOTE — Patient Instructions (Addendum)

## 2021-07-15 DIAGNOSIS — Z20822 Contact with and (suspected) exposure to covid-19: Secondary | ICD-10-CM | POA: Diagnosis not present

## 2021-07-15 LAB — BASIC METABOLIC PANEL
BUN: 30 — AB (ref 4–21)
CO2: 22 (ref 13–22)
Chloride: 99 (ref 99–108)
Creatinine: 0.9 (ref <=1.1)
Glucose: 200
Potassium: 4.2 (ref 3.4–5.3)
Sodium: 138 (ref 137–147)

## 2021-07-15 LAB — LIPID PANEL
Cholesterol: 163 (ref 0–200)
HDL: 56 (ref 35–70)
LDL Cholesterol: 82
Triglycerides: 144 (ref 40–160)

## 2021-07-15 LAB — HEPATIC FUNCTION PANEL
ALT: 14 (ref 7–35)
AST: 13 (ref 13–35)
Alkaline Phosphatase: 86 (ref 25–125)
Bilirubin, Total: 0.3

## 2021-07-15 LAB — COMPREHENSIVE METABOLIC PANEL
Albumin: 4.4 (ref 3.5–5.0)
Calcium: 9.5 (ref 8.7–10.7)
Globulin: 2.4

## 2021-07-15 LAB — CBC AND DIFFERENTIAL
HCT: 43 (ref 36–46)
Hemoglobin: 15.5 (ref 12.0–16.0)
Neutrophils Absolute: 3.6
Platelets: 187 (ref 150–399)
WBC: 6.2

## 2021-07-15 LAB — TSH: TSH: 1.48 (ref <=5.90)

## 2021-07-15 LAB — MICROALBUMIN, URINE: Microalb, Ur: 30.5

## 2021-07-15 LAB — HEMOGLOBIN A1C: Hemoglobin A1C: 8

## 2021-07-15 LAB — CBC: RBC: 4.68 (ref 3.87–5.11)

## 2021-07-15 LAB — VITAMIN D 25 HYDROXY (VIT D DEFICIENCY, FRACTURES): Vit D, 25-Hydroxy: 71.5

## 2021-07-19 ENCOUNTER — Telehealth: Payer: Self-pay

## 2021-07-19 NOTE — Telephone Encounter (Signed)
Called patient and gave her the message per [name]. Patient stated that she is eating a better diet and she thinks that something else is going on to cause her low BS readings. Patient was able to discuss with Kieth Brightly her food options. I advised for patient to keep a log of her blood glucose readings and the time she checks her blood glucose. Patient is linked to the [facility] system and will call back if she is still having low blood glucose readings. Patient verbalized an understanding.

## 2021-07-19 NOTE — Telephone Encounter (Signed)
Patient called this morning and stated that she has been having low blood glucose readings for the past 2 weeks. Patient stated that she was advised to change her Lantus to give 60 units daily a few weeks ago due to getting steroid injections and she took her last injection 2 weeks ago. This morning at 9:01am her BS was 53 and at 9:07am her BS was 67. Patient has treated her low BS and is asking how to lower her Lantus now that she is not on steroid injections? Please advise. Placed recent [facility] readings on your desk.

## 2021-07-20 DIAGNOSIS — F4312 Post-traumatic stress disorder, chronic: Secondary | ICD-10-CM | POA: Diagnosis not present

## 2021-07-20 DIAGNOSIS — M79672 Pain in left foot: Secondary | ICD-10-CM | POA: Diagnosis not present

## 2021-07-20 DIAGNOSIS — Z8616 Personal history of COVID-19: Secondary | ICD-10-CM | POA: Diagnosis not present

## 2021-07-20 DIAGNOSIS — I10 Essential (primary) hypertension: Secondary | ICD-10-CM | POA: Diagnosis not present

## 2021-07-20 DIAGNOSIS — D801 Nonfamilial hypogammaglobulinemia: Secondary | ICD-10-CM | POA: Diagnosis not present

## 2021-07-20 DIAGNOSIS — E782 Mixed hyperlipidemia: Secondary | ICD-10-CM | POA: Diagnosis not present

## 2021-07-20 DIAGNOSIS — E1140 Type 2 diabetes mellitus with diabetic neuropathy, unspecified: Secondary | ICD-10-CM | POA: Diagnosis not present

## 2021-07-20 DIAGNOSIS — E1165 Type 2 diabetes mellitus with hyperglycemia: Secondary | ICD-10-CM | POA: Diagnosis not present

## 2021-07-20 DIAGNOSIS — E039 Hypothyroidism, unspecified: Secondary | ICD-10-CM | POA: Diagnosis not present

## 2021-07-20 DIAGNOSIS — I5032 Chronic diastolic (congestive) heart failure: Secondary | ICD-10-CM | POA: Diagnosis not present

## 2021-07-20 DIAGNOSIS — I480 Paroxysmal atrial fibrillation: Secondary | ICD-10-CM | POA: Diagnosis not present

## 2021-07-20 DIAGNOSIS — F3160 Bipolar disorder, current episode mixed, unspecified: Secondary | ICD-10-CM | POA: Diagnosis not present

## 2021-07-20 DIAGNOSIS — G250 Essential tremor: Secondary | ICD-10-CM | POA: Diagnosis not present
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
## 2021-07-23 ENCOUNTER — Other Ambulatory Visit: Payer: Self-pay

## 2021-07-23 ENCOUNTER — Ambulatory Visit (INDEPENDENT_AMBULATORY_CARE_PROVIDER_SITE_OTHER): Payer: Medicare Other | Admitting: Allergy & Immunology

## 2021-07-23 ENCOUNTER — Encounter: Payer: Self-pay | Admitting: Allergy & Immunology

## 2021-07-23 VITALS — BP 132/70 | HR 80 | Temp 96.3°F | Resp 18 | Ht 64.0 in | Wt 204.2 lb

## 2021-07-23 DIAGNOSIS — J310 Chronic rhinitis: Secondary | ICD-10-CM

## 2021-07-23 DIAGNOSIS — J4550 Severe persistent asthma, uncomplicated: Secondary | ICD-10-CM

## 2021-07-23 DIAGNOSIS — D839 Common variable immunodeficiency, unspecified: Secondary | ICD-10-CM

## 2021-07-23 NOTE — Progress Notes (Signed)
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 Elsworth Soho)    Plan/Recommendations:   1. Hypogammaglobulinemia - Continue with Cuvitru.  - We will continue with 25 g every 2 weeks. - Get labs when you are able to do that.   2. Chronic rhinitis   - Continue with nasal saline rinses as needed.   3. Severe persistent asthma, uncomplicated - Lung function is slowly improving.  - We are going to continue with the same medication regimen for now.  - Daily controller medication(s): Breo 100/29mcg one puff once daily + Spiriva two puffs daily + Fasenra every 8 weeks - Prior to physical activity: ProAir 2 puffs 10-15 minutes before physical activity. - Rescue medications: ProAir 4 puffs every 4-6 hours as needed, albuterol nebulizer one vial every 4-6 hours as needed or DuoNeb nebulizer one vial every 4-6 hours as needed - Asthma control goals:  * Full participation in all desired activities (may need albuterol before activity) * Albuterol use two time or less a week on average (not counting use with activity) * Cough interfering with sleep two time or less a month * Oral steroids no more than once a year * No hospitalizations.  4. Return in about 4 months (around 11/23/2021).    Subjective:   Laura [name] is a 60 y.o. female presenting today for follow up of  Chief Complaint  Patient presents with   Severe persistent asthma without complication    Laura [name] has a history of the following: Patient Active Problem List   Diagnosis Date Noted   Vitamin B12 deficiency 05/21/2021   Vitamin D deficiency 05/21/2021   Abdominal pain 04/27/2021   Urinary incontinence 04/27/2021    Dysphagia 04/27/2021   Constipation 04/27/2021   Mixed 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 ([place]) 04/10/2021   GERD without esophagitis 04/10/2021   Adnexal cyst 01/19/2021   Chronic RLQ pain 01/19/2021   Mixed stress and urge urinary incontinence 01/19/2021   Other fatigue 12/10/2020   Abdominal pain, chronic, right lower quadrant 12/08/2020   Abnormal weight loss 12/08/2020   Poor appetite 12/08/2020   Abnormal EKG    Chronic diastolic (congestive) heart failure ([place]) 08/29/2019   Shortness of breath 08/29/2019   Musculoskeletal chest pain 08/28/2019   Headache 08/10/2018   H/O atrial flutter 08/10/2018   OSA on CPAP 08/10/2018   Anxiety 08/10/2018   Uncontrolled type 2 diabetes mellitus with hyperglycemia ([place]) 03/13/2018   Hypothyroidism 03/13/2018   Essential hypertension, benign 03/13/2018   Mixed hyperlipidemia 03/13/2018   Hypogammaglobulinemia ([place]) 02/06/2018   Non-allergic rhinitis 02/06/2018   Severe persistent asthma, uncomplicated 67/20/9470   Pulmonary nodules 02/06/2018   Bronchiectasis without complication ([place]) 96/28/3662   Type 2 diabetes mellitus with hyperglycemia, with long-term current use of insulin ([place]) 02/06/2018    History obtained from: chart review and patient.  Sheva is a 61 y.o. female presenting for a follow up visit. She was last seen in July 2022. At that time, we continued with her Cuvitru dosing but we increased to 25 grams every 2 weeks. Rhinitis was controlled with the use of nasal saline rinses as well. For her asthma, lung function looked stable. We continued with the  use of Breztri two puffs BID as well as Fasenra every 8 weeks.   Since the last visit, she has done well. She actually tells me today that she is going to going out of town for an extended period of time. She seems fairly happy about this. It will be a combination of a airline flight and a road trip out [place] to see her children.    Asthma/Respiratory Symptom History: She actually was changed back to [facility]. She is not sure why, but her Pulmonologist did that. She sees Dr. Elsworth Soho now. Regardless, she seems to be doing OK on it anyway. She is also on Spiriva.  She has not been using her rescue inhaler much at all. She has not experienced any of these intense SOB episodes that had been a struggle earlier in the year.   Her last visit with Dr. Elsworth Soho was September 2022. He felt that her lymph node was reactive in nature and recommended a follow up chest CT in January 2023.   Allergic Rhinitis Symptom History: Rhinitis is well controlled. She has not needed antibiotics for any sinus infections since the last visit.  She remains on her Cuvitru and is doing well with that. She has not problems with the infusion process. She is going to arrange to have [facility] deliver to the various locations where she will be. She has this all together. She tells me that she has done this previously. Her skin becomes sensitive around her infusion sites, but it is not a hindrance to infusions.  She was recently taken off of metformin. This was done by [name]. She was taken off of the metformin due to "vague GI concerns". Her Trulicity was increased to make up for that. But her glucose readings have been all over the place since that happened. Dr. [place] Crane apparently restarted the metformin.   She actually started working at [employer] in [place]. 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 She seems to enjoy this quite a bit.   Oldest in [place]. Middle one is here. Betsy Coder is in [place] now. She is going to be leaving soon on a prolonged vacation to visit her family across the country. She is leaving October 29th to go to [place] through Thanksgiving. Her [place] daughter is coming to [place] for Thanksgiving. Then she is going to be driving to [place]. She is going to stay there for one week and then she  is coming around December 4th.   Otherwise, there have been no changes to her past medical history, surgical history, family history, or social history.    Review of Systems  Constitutional: Negative.  Negative for chills, fever, malaise/fatigue and weight loss.  HENT:  Negative for congestion, ear discharge, ear pain and sinus pain.   Eyes:  Negative for pain, discharge and redness.  Respiratory:  Positive for cough. Negative for sputum production, shortness of breath and wheezing.   Cardiovascular: Negative.  Negative for chest pain and palpitations.  Gastrointestinal:  Negative for abdominal pain, constipation, diarrhea, heartburn, nausea and vomiting.  Skin: Negative.  Negative for itching and rash.  Neurological:  Negative for dizziness and headaches.  Endo/Heme/Allergies:  Negative for environmental allergies. Does not bruise/bleed easily.      Objective:   Blood pressure 132/70, pulse 80, temperature (!) 96.3 F (35.7 C), temperature source Temporal, resp. rate 18, height 5\' 4"  (1.626 m), weight 204 lb 3.2 oz (92.6 kg), SpO2 97 %. Body mass  index is 35.05 kg/m.   Physical Exam:  Physical Exam Vitals reviewed.  Constitutional:      Appearance: She is well-developed and overweight.  HENT:     Head: Normocephalic and atraumatic.     Right Ear: Tympanic membrane, ear canal and external ear normal.     Left Ear: Tympanic membrane, ear canal and external ear normal.     Nose: No nasal deformity, septal deviation, mucosal edema or rhinorrhea.     Right Turbinates: Enlarged and swollen.     Left Turbinates: Enlarged and swollen.     Right Sinus: No maxillary sinus tenderness or frontal sinus tenderness.     Left Sinus: No maxillary sinus tenderness or frontal sinus tenderness.     Mouth/Throat:     Mouth: Mucous membranes are not pale and not dry.     Pharynx: Uvula midline.  Eyes:     General: Lids are normal. No allergic shiner.       Right eye: No discharge.         Left eye: No discharge.     Conjunctiva/sclera: Conjunctivae normal.     Right eye: Right conjunctiva is not injected. No chemosis.    Left eye: Left conjunctiva is not injected. No chemosis.    Pupils: Pupils are equal, round, and reactive to light.  Cardiovascular:     Rate and Rhythm: Normal rate and regular rhythm.     Heart sounds: Normal heart sounds.  Pulmonary:     Effort: Pulmonary effort is normal. No tachypnea, accessory muscle usage or respiratory distress.     Breath sounds: Normal breath sounds. No wheezing, rhonchi or rales.     Comments: Moving air well in all lung fields. No increased work of breathing noted.  Chest:     Chest wall: No tenderness.  Lymphadenopathy:     Cervical: No cervical adenopathy.  Skin:    Coloration: Skin is not pale.     Findings: No abrasion, erythema, petechiae or rash. Rash is not papular, urticarial or vesicular.  Neurological:     Mental Status: She is alert.  Psychiatric:        Behavior: Behavior is cooperative.     Diagnostic studies:   Spirometry: results abnormal (FEV1: 1.69/67%, FVC: 2.34/74%, FEV1/FVC: 72%).    Spirometry consistent with possible restrictive disease. Overall values are stable compared to previous findings.   Allergy Studies: none     Salvatore Marvel, MD  [facility]

## 2021-07-23 NOTE — Patient Instructions (Addendum)
1. Hypogammaglobulinemia - Continue with Cuvitru.  - We will continue with 25 g every 2 weeks. - Get labs when you are able to do that.   2. Chronic rhinitis   - Continue with nasal saline rinses as needed.   3. Severe persistent asthma, uncomplicated - Lung function is slowly improving.  - We are going to continue with the same medication regimen for now.  - Daily controller medication(s): Breo 100/10mcg one puff once daily + Fasenra every 8 weeks - Prior to physical activity: ProAir 2 puffs 10-15 minutes before physical activity. - Rescue medications: ProAir 4 puffs every 4-6 hours as needed, albuterol nebulizer one vial every 4-6 hours as needed or DuoNeb nebulizer one vial every 4-6 hours as needed - Asthma control goals:  * Full participation in all desired activities (may need albuterol before activity) * Albuterol use two time or less a week on average (not counting use with activity) * Cough interfering with sleep two time or less a month * Oral steroids no more than once a year * No hospitalizations.  4. Return in about 4 months (around 11/23/2021).    Please inform us of any Emergency Department visits, hospitalizations, or changes in symptoms. Call us before going to the ED for breathing or allergy symptoms since we might be able to fit you in for a sick visit. Feel free to contact us anytime with any questions, problems, or concerns.  It was a pleasure to see you again today!  Websites that have reliable patient information: 1. American Academy of Asthma, Allergy, and Immunology: www.aaaai.org 2. Food Allergy Research and Education (FARE): foodallergy.org 3. Mothers of Asthmatics: http://www.asthmacommunitynetwork.org 4. American College of Allergy, Asthma, and Immunology: www.acaai.org   COVID-19 Vaccine Information can be found at: ShippingScam.co.uk For questions related to vaccine distribution or  appointments, please email vaccine@[facility] .com or call [phone].     "Like" [place] on Facebook and Instagram for our latest updates!       Make sure you are registered to vote! If you have moved or changed any of your contact information, you will need to get this updated before voting!  In some cases, you MAY be able to register to vote online: [email or web address]

## 2021-07-26 ENCOUNTER — Ambulatory Visit: Payer: Medicare Other | Admitting: Cardiology

## 2021-07-27 ENCOUNTER — Ambulatory Visit (INDEPENDENT_AMBULATORY_CARE_PROVIDER_SITE_OTHER): Payer: Medicare Other | Admitting: Podiatry

## 2021-07-27 ENCOUNTER — Encounter: Payer: Self-pay | Admitting: Podiatry

## 2021-07-27 ENCOUNTER — Telehealth (INDEPENDENT_AMBULATORY_CARE_PROVIDER_SITE_OTHER): Payer: Medicare Other | Admitting: Psychiatry

## 2021-07-27 ENCOUNTER — Encounter: Payer: Self-pay | Admitting: Psychiatry

## 2021-07-27 ENCOUNTER — Other Ambulatory Visit: Payer: Self-pay

## 2021-07-27 DIAGNOSIS — S99921A Unspecified injury of right foot, initial encounter: Secondary | ICD-10-CM

## 2021-07-27 DIAGNOSIS — M778 Other enthesopathies, not elsewhere classified: Secondary | ICD-10-CM

## 2021-07-27 DIAGNOSIS — F330 Major depressive disorder, recurrent, mild: Secondary | ICD-10-CM

## 2021-07-27 MED ORDER — DIVALPROEX SODIUM ER 500 MG PO TB24: ORAL_TABLET | ORAL | 0 refills | Status: DC

## 2021-07-27 NOTE — Addendum Note (Signed)
Addended by: Clovis Riley E on: 07/27/2021 01:00 PM   Modules accepted: Orders

## 2021-07-27 NOTE — Patient Instructions (Signed)
1. Continue Depakote 500 mg in AM, 1000 mg at night 2. Next appointment: 1/17 at 1 PM

## 2021-07-27 NOTE — Progress Notes (Signed)
She states that after she went dancing the toe second right really got inflamed and is painful.  She states that it looks worse now than it did before and it seemed to have been getting better at 1 time.  Objective: Vital signs are stable alert and oriented x3.  Pulses are palpable.  She no longer has pain on palpation medial calcaneal tubercle.  She has pain with a severe hammertoe deformity and varus rotation of the toe most likely rupture of the plantar plate and plantar capsule.  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  Plan: Requesting MRI for surgical consideration and differential diagnosis right foot.

## 2021-07-28 ENCOUNTER — Encounter: Payer: Self-pay | Admitting: Allergy & Immunology

## 2021-08-02 ENCOUNTER — Other Ambulatory Visit: Payer: Self-pay

## 2021-08-02 ENCOUNTER — Telehealth: Payer: Self-pay | Admitting: Neurology

## 2021-08-02 NOTE — Telephone Encounter (Signed)
Medicine is topamax 100mg  1bid  Pharmacy-[facility] [place]  She is going out of town and wont be back until December.

## 2021-08-02 NOTE — Telephone Encounter (Signed)
Pt is requesting a refill for 8 weeks due to her being out of town for 8 weeks. Stated she needs it asap

## 2021-08-03 NOTE — Telephone Encounter (Signed)
Pt is sch for a VV with Tat on 08-05-21 at 1:00

## 2021-08-03 NOTE — Progress Notes (Signed)
Virtual Visit Via Video   The purpose of this virtual visit is to provide medical care while limiting exposure to the novel coronavirus.    Consent was obtained for video visit:  Yes.   Answered questions that patient had about telehealth interaction:  Yes.   I discussed the limitations, risks, security and privacy concerns of performing an evaluation and management service by telemedicine. I also discussed with the patient that there may be a patient responsible charge related to this service. The patient expressed understanding and agreed to proceed.  Pt location: Home Physician Location: office Name of referring provider:  Benita Stabile, MD I connected with Laura [name] at patients initiation/request on 08/05/2021 at  1:00 PM EDT by video enabled telemedicine application and verified that I am speaking with the correct person using two identifiers. Pt MRN:  [id number] Pt DOB:  February 01, 1961 Video Participants:  Laura [name];    Assessment/Plan:   1.  Tremor  -Discussed with patient again that, at the very least, this is exacerbated by medication if not caused by them.  We have discussed previously as well as today that one cannot diagnose essential tremor when one is on medications that induce tremor.  She is on both psychiatric medications that induce tremor (Depakote) as well as asthma medications that induce tremor.  In addition, anxiety alone can exacerbate tremor.  In my experience, it is difficult to give patient's medications to control tremor when they are on tremor inducing medications.  We tried to do this last visit with starting her on topiramate and she did not find it beneficial.  Primidone is not an option for her because of interaction with Eliquis.  Ultimately, I suggested that we increase the topiramate to 100 mg bid.  Studies have shown that topiramate dosages needed to be fairly high in order to get adequate control on tremor.  That being said, I told the patient that if  this does not prove to be beneficial, I really would not recommend other tremor medications, nor would I recommend going up further on this.  She has just on too many tremor inducing medications to continue to try to add more medications to counteract the side effects of the ones inducing tremor.  2.  Hand paresthesias  -She previously had an EMG with Dr. 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 Franky Macho).  3.  Cervical spinal stenosis  -following with Dr. Franky Macho  4.  Patient reported that primary care was going to refer her to neurology in [place] for neuropathy, presumably to Dr. Gerilyn Pilgrim.  She can continue to follow with him for all care, or she can follow here for care as well.  She would like to follow with him for neuropathy, and continue to follow here as well.  Next visit, she will follow with my PA.  Subjective   Patient seen today in follow-up for tremor.  Patient currently on topiramate, 100 mg twice per day for tremor.  She reports today that it is noticeably better.  No side effects.  She is still on tremor inducing medications, both from psychiatry as well as from pulmonary.  She last saw her psychiatrist on October 18.  Those notes are reviewed. Current movement disorder medications: Topiramate, 100 mg twice per day  Current medications that may exacerbate tremor:  Depakote (been on many years), Spiriva, Dulera previously - now symbicort; singulair   Past psychiatric meds:   lithium, carbamazepine, risperidone, Geodon, olanzapine, quetiapine, Abilify, Ingrezza   Current Outpatient Medications on File Prior to Visit  Medication Sig Dispense Refill   acetaminophen (TYLENOL) 325 MG tablet Take 2 tablets (650 mg total) by mouth every 6 (six)  hours as needed for mild pain, fever or headache. 30 tablet 1   albuterol (PROVENTIL) (2.5 MG/3ML) 0.083% nebulizer solution Take 3 mLs (2.5 mg total) by nebulization every 4 (four) hours as needed for wheezing or shortness of breath. 75 mL 1   albuterol (VENTOLIN HFA) 108 (90 Base) MCG/ACT inhaler INHALE 2 PUFFS INTO THE LUNGS EVERY 6 HOURS AS NEEDED FOR WHEEZING OR SHORTNESS OF BREATH 18 g 1   apixaban (ELIQUIS) 5 MG TABS tablet Take 1 tablet (5 mg total) by mouth 2 (two) times daily. 180 tablet 3   atorvastatin (LIPITOR) 10 MG tablet Take 10 mg by mouth every evening.      BD PEN NEEDLE NANO 2ND GEN 32G X 4 MM MISC 1 each by Other route in the morning, at noon, in the evening, and at bedtime.     bisoprolol (ZEBETA) 5 MG tablet Take 0.5 tablets (2.5 mg total) by mouth daily. 45 tablet 3   blood glucose meter kit and supplies 1 each by Other route 4 (four) times daily. Dispense based on patient and insurance preference. Use up to four times daily as directed. (FOR ICD-10 E10.9, E11.9). 1 each 0   Cholecalciferol (VITAMIN D3) 125 MCG (5000 UT) CAPS Take 1 capsule (5,000 Units total) by mouth daily. 90 capsule 0   Continuous Blood Gluc Receiver (FREESTYLE LIBRE 2 READER) DEVI As directed 1 each 0   Continuous Blood Gluc Sensor (FREESTYLE LIBRE 2 SENSOR) MISC 1 Piece by Does not apply route every 14 (fourteen) days. 2 each 3   diltiazem (CARDIZEM CD) 360 MG 24 hr capsule TAKE 1 CAPSULE(360 MG) BY MOUTH DAILY 90 capsule 3   divalproex (DEPAKOTE ER) 500 MG 24 hr tablet Take 1 tablet (500 mg total) by mouth daily AND 2 tablets (1,000 mg total) at bedtime. 270 tablet 0   Dulaglutide (TRULICITY) 1.5 FH/2.1FX SOPN Inject 1.5 mg into the skin once. Patient takes once a week on Sunday     ELDERBERRY PO Take 4 g by mouth daily. Gummies 2g each     EPINEPHrine 0.3 mg/0.3 mL IJ SOAJ injection Inject 0.3 mg into the muscle once as needed for anaphylaxis.     FASENRA PEN 30 MG/ML SOAJ INJECT $RemoveBef'30MG'InHbFMSpRT$  SUBCUTANEOUSLY   EVERY 8 WEEKS (GIVEN AT MD  OFFICE) 1 mL 6   ferrous sulfate 324 MG TBEC Take 324 mg by mouth daily with breakfast.     fluticasone (FLOVENT HFA) 220 MCG/ACT inhaler 2 puffs twice daily with spacer2 puffs twice daily with spacer 1 each 1   fluticasone furoate-vilanterol (BREO ELLIPTA) 100-25 MCG/INH AEPB Inhale 1 puff into the lungs daily. 60 each 0   gabapentin (NEURONTIN) 600 MG tablet Take 600 mg by mouth 3 (three) times daily.     gabapentin (NEURONTIN) 600 MG tablet Take 600 mg by mouth 3 (three) times daily.     hydrALAZINE (APRESOLINE) 25 MG tablet Take 25 mg by mouth 3 (three) times daily.     hydrOXYzine (ATARAX/VISTARIL) 25 MG tablet Take 1 tablet (25 mg total) by mouth every 6 (six) hours as needed  for anxiety or nausea. 30 tablet 0   Immune Globulin, Human, 4 GM/20ML SOLN Inject 100 mLs into the skin every 14 (fourteen) days.     Insulin Pen Needle (PEN NEEDLES 3/16") 31G X 5 MM MISC Use as directed with insulin pen 100 each 0   LANTUS SOLOSTAR 100 UNIT/ML Solostar Pen INJECT 50 UNITS INTO THE SKIN ONCE DAILY 45 mL 0   levothyroxine (SYNTHROID, LEVOTHROID) 112 MCG tablet Take 112 mcg by mouth daily before breakfast.     linaclotide (LINZESS) 145 MCG CAPS capsule Take 1 capsule (145 mcg total) by mouth daily before breakfast. 90 capsule 3   Melatonin 3 MG TABS Take 3 mg by mouth at bedtime.     metFORMIN (GLUCOPHAGE) 500 MG tablet Take 500 mg by mouth 2 (two) times daily.     metoCLOPramide (REGLAN) 5 MG tablet Take 5 mg by mouth 3 (three) times daily.     montelukast (SINGULAIR) 10 MG tablet Take 1 tablet by mouth daily.     Multiple Vitamins-Minerals (MULTIVITAMIN WITH MINERALS) tablet Take 1 tablet by mouth daily. Woman     Nebulizer MISC Nebulizer tubing kit 2 each 5   omeprazole (PRILOSEC) 20 MG capsule TAKE 1 CAPSULE(20 MG) BY MOUTH TWICE DAILY BEFORE A MEAL 60 capsule 5   ondansetron (ZOFRAN) 4 MG tablet Take 1 tablet (4 mg total) by mouth every 8 (eight) hours as needed for  nausea or vomiting. 20 tablet 0   ONETOUCH ULTRA test strip 1 each by Other route in the morning, at noon, in the evening, and at bedtime.     polyethylene glycol (MIRALAX / GLYCOLAX) 17 g packet Take 17 g by mouth daily as needed. 14 each 0   Potassium Chloride ER 20 MEQ TBCR Take 20 mEq by mouth daily.     Tiotropium Bromide Monohydrate (SPIRIVA RESPIMAT) 1.25 MCG/ACT AERS Inhale 2 puffs into the lungs daily. 4 g 1   topiramate (TOPAMAX) 100 MG tablet Take 1 tablet (100 mg total) by mouth 2 (two) times daily. 180 tablet 1   torsemide (DEMADEX) 20 MG tablet Take 60 mg by mouth daily.     triamcinolone cream (KENALOG) 0.1 % Apply 1 application topically 2 (two) times daily.     vitamin B-12 (CYANOCOBALAMIN) 1000 MCG tablet Take 1,000 mcg by mouth daily.     vitamin C (ASCORBIC ACID) 500 MG tablet Take 500 mg by mouth daily. Power C immune support     No current facility-administered medications on file prior to visit.     Objective   Vitals:   08/05/21 1016  Weight: 200 lb (90.7 kg)  Height: $Remove'5\' 4"'prnsAZg$  (1.626 m)    GEN:  The patient appears stated age and is in NAD.  Neurological examination:  Orientation: The patient is alert and oriented x3. Cranial nerves: There is good facial symmetry. There is no facial hypomimia.  The speech is fluent and clear. Soft palate rises symmetrically and there is no tongue deviation. Hearing is intact to conversational tone. Motor: Strength is at least antigravity x 4.   Shoulder shrug is equal and symmetric.  There is no pronator drift.  Movement examination: Tone: unable Abnormal movements: there is postural tremor on the R Coordination:  There is no decremation with RAM's     Follow up Instructions      -I discussed the assessment and treatment plan with the patient. The patient was provided an opportunity to ask questions and all were answered. The patient  agreed with the plan and demonstrated an understanding of the instructions.   The  patient was advised to call back or seek an in-person evaluation if the symptoms worsen or if the condition fails to improve as anticipated.     Alonza Bogus, DO

## 2021-08-04 ENCOUNTER — Other Ambulatory Visit: Payer: Self-pay

## 2021-08-04 ENCOUNTER — Ambulatory Visit (INDEPENDENT_AMBULATORY_CARE_PROVIDER_SITE_OTHER): Payer: Medicare Other | Admitting: Clinical

## 2021-08-04 DIAGNOSIS — F4310 Post-traumatic stress disorder, unspecified: Secondary | ICD-10-CM

## 2021-08-04 NOTE — Progress Notes (Signed)
Virtual Visit via Video Note   I connected with Laura [name] on 08/04/21 at  10:00 AM EDT by a video enabled telemedicine application and verified that I am speaking with the correct person using two identifiers.   Location: Patient: Home Provider: Office   I discussed the limitations of evaluation and management by telemedicine and the availability of in person appointments. The patient expressed understanding and agreed to proceed.       THERAPIST PROGRESS NOTE   Session Time: 10:00 AM-:10:55 AM   Participation Level: Active   Behavioral Response: CasualAlertDepressed   Type of Therapy: Individual Therapy   Treatment Goals addressed: Coping   Interventions: CBT, DBT, Solution Focused, Strength-based and Supportive   Summary: Laura [name] is a 60 y.o. female who presents with PTSD. The OPT therapist worked with the patient for her ongoing OPT treatment session. The OPT therapist utilized Motivational Interviewing to assist in creating therapeutic repore. The patient in the session was engaged and work in collaboration giving feedback about her triggers and symptoms over the past few weeks. The patient has been having difficulty with some of her interactions that have been serving as a trigger this primarily connected to her middle daughter Linwood Dibbles. The patient in this session spoke about learning about a person she use to talk to previously has passed away.The patient learned of the passing through talking with the patients sister on social media. The OPT therapist utilized Cognitive Behavioral Therapy through cognitive restructuring as well as worked with the patient on coping strategies to assist in management of mental health symptoms. The patient reviewed her ongoing work on her physical health. The OPT therapist provided emotional support and validated the patients feelings as she was upset having difficulty post shock of learning of the passing of a person she use to be involved  with. The patient has been reaching out to her support system (friends) in talking about the loss. The patient notes the relationship with her friend who passed away ended on bad terms and this is part of her difficulty currently coping with the loss. The patient did see her psychiatrist Dr. Modesta Messing on  07/27/2021. The OPT therapist placed emphasis on the patient keeping all upcoming health appointments and adhering to ongoing health treatment recommendations.   Suicidal/Homicidal: Nowithout intent/plan   Therapist Response:The OPT therapist worked with the patient for the patients scheduled session. The patient was engaged in her session and gave feedback in relation to triggers, symptoms, and behavior responses over the past few weeks. The OPT therapist worked with the patient utilizing an in session Cognitive Behavioral Therapy exercise. The patient was responsive in the session and verbalized, " I have never dealt with a suicide before, we were romantically involved, I knew the family and his parents, and his sister has ask me to be involved and writing the Eulogy". The patient spoke about her struggle in coping due to her interaction with a prior romantic partner who she found out recently passed away. The OPT therapist gave support and encouragement during this time of grief for the passing of someone she cared about.The OPT therapist provided psycho-education throughout the session including reviewing the link between physical health and mental health. The patient spoke about her upcoming travel plans noting she is leaving for [place] this coming Saturday.The OPT therapist will continue treatment work with the patient in her next scheduled session   Plan: Return again in 2/3 weeks.   Diagnosis:      Axis I:Post Traumatic  Stress Disorder                           Axis II: No diagnosis   I discussed the assessment and treatment plan with the patient. The patient was provided an opportunity to ask  questions and all were answered. The patient agreed with the plan and demonstrated an understanding of the instructions.   The patient was advised to call back or seek an in-person evaluation if the symptoms worsen or if the condition fails to improve as anticipated.   I provided 55 minutes of non-face-to-face time during this encounter.   [name], LCSW   08/04/2021

## 2021-08-05 ENCOUNTER — Encounter: Payer: Self-pay | Admitting: Neurology

## 2021-08-05 ENCOUNTER — Other Ambulatory Visit: Payer: Self-pay

## 2021-08-05 ENCOUNTER — Telehealth (INDEPENDENT_AMBULATORY_CARE_PROVIDER_SITE_OTHER): Payer: Medicare Other | Admitting: Neurology

## 2021-08-05 VITALS — Ht 64.0 in | Wt 200.0 lb

## 2021-08-05 DIAGNOSIS — R251 Tremor, unspecified: Secondary | ICD-10-CM | POA: Diagnosis not present

## 2021-08-05 DIAGNOSIS — G5601 Carpal tunnel syndrome, right upper limb: Secondary | ICD-10-CM | POA: Diagnosis not present

## 2021-08-05 MED ORDER — TOPIRAMATE 100 MG PO TABS: 100.0000 mg | ORAL_TABLET | Freq: Two times a day (BID) | ORAL | 2 refills | Status: DC

## 2021-08-10 ENCOUNTER — Other Ambulatory Visit: Payer: Self-pay | Admitting: "Endocrinology

## 2021-08-10 ENCOUNTER — Ambulatory Visit: Payer: Medicare Other | Admitting: Urology

## 2021-08-10 DIAGNOSIS — Z20822 Contact with and (suspected) exposure to covid-19: Secondary | ICD-10-CM | POA: Diagnosis not present

## 2021-08-20 ENCOUNTER — Telehealth: Payer: Self-pay | Admitting: "Endocrinology

## 2021-08-20 DIAGNOSIS — E1165 Type 2 diabetes mellitus with hyperglycemia: Secondary | ICD-10-CM

## 2021-08-20 MED ORDER — TRULICITY 1.5 MG/0.5ML ~~LOC~~ SOAJ: 1.5000 mg | Freq: Once | SUBCUTANEOUS | 0 refills | Status: AC

## 2021-08-20 NOTE — Telephone Encounter (Signed)
Pt made aware

## 2021-08-20 NOTE — Telephone Encounter (Signed)
Sent Rx refill for Trulicity 1.5mg  to requested pharmacy. We did not prescribe pt's gabapentin.

## 2021-08-20 NOTE — Telephone Encounter (Signed)
Patient is out of state. She needs a refill on her Trucliity 1.75. Also, she was not sure if you would refill her Gabapentin or not, she said she could not remember who initially prescribed that   [place], [place] Phone Number [phone]

## 2021-08-21 DIAGNOSIS — M25531 Pain in right wrist: Secondary | ICD-10-CM | POA: Diagnosis not present

## 2021-08-21 DIAGNOSIS — S99912A Unspecified injury of left ankle, initial encounter: Secondary | ICD-10-CM | POA: Diagnosis not present

## 2021-08-21 DIAGNOSIS — M79601 Pain in right arm: Secondary | ICD-10-CM | POA: Diagnosis not present

## 2021-08-21 DIAGNOSIS — R0602 Shortness of breath: Secondary | ICD-10-CM | POA: Diagnosis not present

## 2021-08-21 DIAGNOSIS — E119 Type 2 diabetes mellitus without complications: Secondary | ICD-10-CM | POA: Diagnosis not present

## 2021-08-21 DIAGNOSIS — S79912A Unspecified injury of left hip, initial encounter: Secondary | ICD-10-CM | POA: Diagnosis not present

## 2021-08-21 DIAGNOSIS — Z7984 Long term (current) use of oral hypoglycemic drugs: Secondary | ICD-10-CM | POA: Diagnosis not present

## 2021-08-21 DIAGNOSIS — S6991XA Unspecified injury of right wrist, hand and finger(s), initial encounter: Secondary | ICD-10-CM | POA: Diagnosis not present

## 2021-08-21 DIAGNOSIS — M25572 Pain in left ankle and joints of left foot: Secondary | ICD-10-CM | POA: Diagnosis not present

## 2021-08-21 DIAGNOSIS — G319 Degenerative disease of nervous system, unspecified: Secondary | ICD-10-CM | POA: Diagnosis not present

## 2021-08-21 DIAGNOSIS — S199XXA Unspecified injury of neck, initial encounter: Secondary | ICD-10-CM | POA: Diagnosis not present

## 2021-08-21 DIAGNOSIS — I10 Essential (primary) hypertension: Secondary | ICD-10-CM | POA: Diagnosis not present

## 2021-08-21 DIAGNOSIS — I7090 Unspecified atherosclerosis: Secondary | ICD-10-CM | POA: Diagnosis not present

## 2021-08-21 DIAGNOSIS — S0990XA Unspecified injury of head, initial encounter: Secondary | ICD-10-CM | POA: Diagnosis not present

## 2021-08-25 ENCOUNTER — Encounter (HOSPITAL_COMMUNITY): Payer: Self-pay

## 2021-08-25 ENCOUNTER — Telehealth (HOSPITAL_COMMUNITY): Payer: Self-pay | Admitting: Clinical

## 2021-08-25 ENCOUNTER — Other Ambulatory Visit: Payer: Self-pay | Admitting: "Endocrinology

## 2021-08-25 ENCOUNTER — Ambulatory Visit (HOSPITAL_COMMUNITY): Payer: Medicare Other | Admitting: Clinical

## 2021-08-25 ENCOUNTER — Other Ambulatory Visit: Payer: Self-pay

## 2021-08-25 DIAGNOSIS — E1165 Type 2 diabetes mellitus with hyperglycemia: Secondary | ICD-10-CM

## 2021-08-25 NOTE — Telephone Encounter (Signed)
The patient did not respond to video link, phone call, or VM 

## 2021-08-26 ENCOUNTER — Ambulatory Visit: Payer: Medicare Other | Admitting: Nutrition

## 2021-08-26 ENCOUNTER — Ambulatory Visit: Payer: Medicare Other | Admitting: "Endocrinology

## 2021-08-27 ENCOUNTER — Other Ambulatory Visit: Payer: Self-pay

## 2021-08-27 ENCOUNTER — Ambulatory Visit (HOSPITAL_COMMUNITY): Payer: Medicare Other | Admitting: Clinical

## 2021-08-27 ENCOUNTER — Encounter (HOSPITAL_COMMUNITY): Payer: Self-pay

## 2021-08-30 ENCOUNTER — Other Ambulatory Visit: Payer: Self-pay

## 2021-08-30 ENCOUNTER — Ambulatory Visit (INDEPENDENT_AMBULATORY_CARE_PROVIDER_SITE_OTHER): Payer: Medicare Other | Admitting: Clinical

## 2021-08-30 DIAGNOSIS — F4310 Post-traumatic stress disorder, unspecified: Secondary | ICD-10-CM | POA: Diagnosis not present

## 2021-08-30 NOTE — Progress Notes (Signed)
Virtual Visit via Video Note   I connected with Laura [name] on 08/30/21 at  10:00 AM EDT by a video enabled telemedicine application and verified that I am speaking with the correct person using two identifiers.   Location: Patient: Home Provider: Office   I discussed the limitations of evaluation and management by telemedicine and the availability of in person appointments. The patient expressed understanding and agreed to proceed.       THERAPIST PROGRESS NOTE   Session Time: 10:00 AM-:10:55 AM   Participation Level: Active   Behavioral Response: CasualAlertDepressed   Type of Therapy: Individual Therapy   Treatment Goals addressed: Coping   Interventions: CBT, DBT, Solution Focused, Strength-based and Supportive   Summary: Laura [name] is a 60 y.o. female who presents with PTSD. The OPT therapist worked with the patient for her ongoing OPT treatment session. The OPT therapist utilized Motivational Interviewing to assist in creating therapeutic repore. The patient in the session was engaged and work in collaboration giving feedback about her triggers and symptoms over the past few weeks. The patient currently is in [place] with family for the Thanksgiving holiday and spoke about her experience thus far and interactions with family. The patient spoke about having a Fall while on her trip but thankfully was not seriously injured, however, was seen at a local hospital in [place].The patient spoke about having conflict with her daughter who she is currently staying with in [place].The patient will be going from [place] and then to [place] to stay with another family member before returning to [place]. The OPT therapist utilized Cognitive Behavioral Therapy through cognitive restructuring as well as worked with the patient on coping strategies to assist in management of mental health symptoms. The patient reviewed her ongoing work on her physical health. The OPT therapist  provided emotional support and validated the patients feelings as she was upset about having conflict with her daughter. The OPT therapist placed emphasis on the patient keeping all upcoming health appointments and adhering to ongoing health treatment recommendations.    Suicidal/Homicidal: Nowithout intent/plan   Therapist Response:The OPT therapist worked with the patient for the patients scheduled session. The patient was engaged in her session and gave feedback in relation to triggers, symptoms, and behavior responses over the past few weeks. The OPT therapist worked with the patient utilizing an in session Cognitive Behavioral Therapy exercise. The patient was responsive in the session and verbalized, " I feel like I may have overstepped in trying to help with managing disrespect from the grand kids because I have said a few things to the kids and I said a few things to my daughters boyfriend and I think this created conflict between me and my daughter I was overstepping and I am going to have to step back and be more supportive". The patient spoke about her struggle ongoing with her other daughter Laura [name]. The OPT therapist provided psycho-education throughout the session including reviewing the link between physical health and mental health. The patient spoke about ongoing plans for the rest of her traveling for the holidays. The OPT therapist will continue treatment work with the patient in her next scheduled session   Plan: Return again in 2/3 weeks.   Diagnosis:      Axis I:Post Traumatic Stress Disorder                           Axis II: No diagnosis   I discussed the assessment  and treatment plan with the patient. The patient was provided an opportunity to ask questions and all were answered. The patient agreed with the plan and demonstrated an understanding of the instructions.   The patient was advised to call back or seek an in-person evaluation if the symptoms worsen or if the condition  fails to improve as anticipated.   I provided 55 minutes of non-face-to-face time during this encounter.   [name], LCSW   08/30/2021

## 2021-09-09 DIAGNOSIS — Z20822 Contact with and (suspected) exposure to covid-19: Secondary | ICD-10-CM | POA: Diagnosis not present

## 2021-09-15 ENCOUNTER — Encounter: Payer: Self-pay | Admitting: Allergy & Immunology

## 2021-09-15 ENCOUNTER — Other Ambulatory Visit: Payer: Self-pay

## 2021-09-15 ENCOUNTER — Ambulatory Visit (INDEPENDENT_AMBULATORY_CARE_PROVIDER_SITE_OTHER): Payer: Medicare Other | Admitting: Allergy & Immunology

## 2021-09-15 DIAGNOSIS — J0100 Acute maxillary sinusitis, unspecified: Secondary | ICD-10-CM | POA: Diagnosis not present

## 2021-09-15 DIAGNOSIS — J479 Bronchiectasis, uncomplicated: Secondary | ICD-10-CM

## 2021-09-15 DIAGNOSIS — D839 Common variable immunodeficiency, unspecified: Secondary | ICD-10-CM

## 2021-09-15 DIAGNOSIS — J4550 Severe persistent asthma, uncomplicated: Secondary | ICD-10-CM | POA: Diagnosis not present

## 2021-09-15 DIAGNOSIS — J310 Chronic rhinitis: Secondary | ICD-10-CM | POA: Diagnosis not present

## 2021-09-15 DIAGNOSIS — R918 Other nonspecific abnormal finding of lung field: Secondary | ICD-10-CM

## 2021-09-15 MED ORDER — AMOXICILLIN-POT CLAVULANATE 875-125 MG PO TABS: 1.0000 | ORAL_TABLET | Freq: Two times a day (BID) | ORAL | 0 refills | Status: AC

## 2021-09-15 NOTE — Progress Notes (Signed)
RE: Laura [name] MRN: [id number] DOB: 05-Sep-1961 Date of Telemedicine Visit: 09/15/2021  Referring provider: [name], MD Primary care provider: [name], MD  Chief Complaint: Cough (Patient gave verbal consent to treat and bill insurance for this visit.)   Telemedicine Follow Up Visit via Telephone: I connected with Laura [name] for a follow up on 09/17/21 by telephone and verified that I am speaking with the correct person using two identifiers.   I discussed the limitations, risks, security and privacy concerns of performing an evaluation and management service by telephone and the availability of in person appointments. I also discussed with the patient that there may be a patient responsible charge related to this service. The patient expressed understanding and agreed to proceed.  Patient is at her daughter's house in [place].  Provider is at the office.  Visit start time: 10:43 AM Visit end time: 11:07 AM Insurance consent/check in by: Laura [name] consent and medical assistant/nurse: Laura [name]  History of Present Illness:  Laura [name] is a 60 year old female who presents today for a virtual sick visit.  She is well-known to this practice and has a history of hypogammaglobulinemia as well as chronic rhinitis and severe persistent asthma.  She was last seen in October 2022.  At that time, she was doing well on Cuvitru 25 g every 2 weeks.  We obtained repeat labs to monitor her IgG level.  These have not been done.  For her asthma, we continued with Breo 100 mcg 1 puff once daily as well as Spiriva 2 puffs once daily.  We also continue with Fasenra every 8 weeks.  She was planning to be out of town for a couple of months visiting her family around the country.  She had already talked to [facility] to help with making sure her medications were delivered to wherever she would be staying.  In the interim, she has been having a lot of dark brown mucous production. She has  been sick for a few days. She denies fever. She has been hacking up dark brown mucous. She reports a lot of 4-5 days of coughing. She does not want to be sick while she is on vacation.  She has been using a lot of over-the-counter medications without any improvement.  She did try calling Laura [name] office. They have not been great to work with.  She has had trouble getting any kind of help over the phone.  She got to [place] around October 26th. She has not had a good time at all. She reports that this is an emotionally "not good" trip. She was supposed to go to [place] last Saturday and her daughter's boyfriend did not get paid and did not have the money to drive to [place]. She is tired and sick and just not feeling well.  It sounds like her relationship with her daughter with whom she is staying it is somewhat strained.  She tells me that she probably tried to parent her grandchildren too much, which placed her daughter off.  She has been spending a lot of time in her room because her daughter got angry at her for that.  She changed her airline ticket so that she was, sooner.  However, she is going to be there around 1 more week.  She did have a fall as well. She reports that she was taking pictures in [place], [place]. She tripped and fell and hit her head. She did go to the ED and thankfully  break anything at all. She is recovering from everything.  She is up-to-date on her infusions.  They continue to go well.  Aside from this current sinus infection, she has not needed antibiotics in about 4 5 months.  She did have COVID-19 at the end of August and unfortunately symptoms continued through mid September.  She received a course of Paxlovid and a course of prednisone during that time.  Otherwise, there have been no changes to her past medical history, surgical history, family history, or social history.  Assessment and Plan:  Laura [name] is a 60 y.o. 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  She is on vacation and has developed what sounds like sinusitis.  She has been trying over-the-counter medications and her routine medications without improvement.  Therefore, we are going to send in some Augmentin.  She is up-to-date on her immunoglobulin infusions.  She does need to get her labs that I ordered at the last visit.  Diagnostics: None.  Medication List:  Current Outpatient Medications  Medication Sig Dispense Refill   acetaminophen (TYLENOL) 325 MG tablet Take 2 tablets (650 mg total) by mouth every 6 (six) hours as needed for mild pain, fever or headache. 30 tablet 1   albuterol (PROVENTIL) (2.5 MG/3ML) 0.083% nebulizer solution Take 3 mLs (2.5 mg total) by nebulization every 4 (four) hours as needed for wheezing or shortness of breath. 75 mL 1   albuterol (VENTOLIN HFA) 108 (90 Base) MCG/ACT inhaler INHALE 2 PUFFS INTO THE LUNGS EVERY 6 HOURS AS NEEDED FOR WHEEZING OR SHORTNESS OF BREATH 18 g 1   amoxicillin-clavulanate (AUGMENTIN) 875-125 MG tablet Take 1 tablet by mouth 2 (two) times daily for 14 days. 28 tablet 0   apixaban (ELIQUIS) 5 MG TABS tablet Take 1 tablet (5 mg total) by mouth 2 (two) times daily. 180 tablet 3   atorvastatin (LIPITOR) 10 MG tablet Take 10 mg by mouth every evening.      BD PEN NEEDLE NANO 2ND GEN 32G X 4 MM MISC 1 each by Other route in the morning, at noon, in the evening, and at bedtime.     bisoprolol (ZEBETA) 5 MG tablet Take 0.5 tablets (2.5 mg total) by mouth daily. 45 tablet 3   blood glucose meter kit and supplies 1 each by Other route 4 (four) times daily.  Dispense based on patient and insurance preference. Use up to four times daily as directed. (FOR ICD-10 E10.9, E11.9). 1 each 0   Cholecalciferol (VITAMIN D3) 125 MCG (5000 UT) CAPS Take 1 capsule (5,000 Units total) by mouth daily. 90 capsule 0   Continuous Blood Gluc Receiver (FREESTYLE LIBRE 2 READER) DEVI As directed 1 each 0   Continuous Blood Gluc Sensor (FREESTYLE LIBRE 2 SENSOR) MISC 1 Piece by Does not apply route every 14 (fourteen) days. 2 each 3   diltiazem (CARDIZEM CD) 360 MG 24 hr capsule TAKE 1 CAPSULE(360 MG) BY MOUTH DAILY 90 capsule 3   divalproex (DEPAKOTE ER) 500 MG 24 hr tablet Take 1 tablet (500 mg total) by mouth daily AND 2 tablets (1,000 mg total) at bedtime.  270 tablet 0   ELDERBERRY PO Take 4 g by mouth daily. Gummies 2g each     EPINEPHrine 0.3 mg/0.3 mL IJ SOAJ injection Inject 0.3 mg into the muscle once as needed for anaphylaxis.     FASENRA PEN 30 MG/ML SOAJ INJECT $RemoveBef'30MG'evyqRPuPOP$  SUBCUTANEOUSLY  EVERY 8 WEEKS (GIVEN AT MD  OFFICE) 1 mL 6   ferrous sulfate 324 MG TBEC Take 324 mg by mouth daily with breakfast.     fluticasone furoate-vilanterol (BREO ELLIPTA) 100-25 MCG/INH AEPB Inhale 1 puff into the lungs daily. 60 each 0   gabapentin (NEURONTIN) 600 MG tablet Take 600 mg by mouth 3 (three) times daily.     hydrALAZINE (APRESOLINE) 25 MG tablet Take 25 mg by mouth 3 (three) times daily.     hydrOXYzine (ATARAX/VISTARIL) 25 MG tablet Take 1 tablet (25 mg total) by mouth every 6 (six) hours as needed for anxiety or nausea. 30 tablet 0   Immune Globulin, Human, 4 GM/20ML SOLN Inject 100 mLs into the skin every 14 (fourteen) days.     Insulin Pen Needle (PEN NEEDLES 3/16") 31G X 5 MM MISC Use as directed with insulin pen 100 each 0   LANTUS SOLOSTAR 100 UNIT/ML Solostar Pen INJECT 50 UNITS INTO THE SKIN ONCE DAILY 45 mL 0   levothyroxine (SYNTHROID, LEVOTHROID) 112 MCG tablet Take 112 mcg by mouth daily before breakfast.     linaclotide (LINZESS) 145 MCG CAPS capsule Take 1  capsule (145 mcg total) by mouth daily before breakfast. 90 capsule 3   Melatonin 3 MG TABS Take 3 mg by mouth at bedtime.     metFORMIN (GLUCOPHAGE) 500 MG tablet Take 500 mg by mouth 2 (two) times daily.     metoCLOPramide (REGLAN) 5 MG tablet Take 5 mg by mouth 3 (three) times daily.     montelukast (SINGULAIR) 10 MG tablet Take 1 tablet by mouth daily.     Multiple Vitamins-Minerals (MULTIVITAMIN WITH MINERALS) tablet Take 1 tablet by mouth daily. Woman     Nebulizer MISC Nebulizer tubing kit 2 each 5   omeprazole (PRILOSEC) 20 MG capsule TAKE 1 CAPSULE(20 MG) BY MOUTH TWICE DAILY BEFORE A MEAL 60 capsule 5   ondansetron (ZOFRAN) 4 MG tablet Take 1 tablet (4 mg total) by mouth every 8 (eight) hours as needed for nausea or vomiting. 20 tablet 0   ONETOUCH ULTRA test strip 1 each by Other route in the morning, at noon, in the evening, and at bedtime.     polyethylene glycol (MIRALAX / GLYCOLAX) 17 g packet Take 17 g by mouth daily as needed. 14 each 0   Potassium Chloride ER 20 MEQ TBCR Take 20 mEq by mouth daily.     topiramate (TOPAMAX) 100 MG tablet Take 1 tablet (100 mg total) by mouth 2 (two) times daily. 180 tablet 2   torsemide (DEMADEX) 20 MG tablet Take 60 mg by mouth daily.     triamcinolone cream (KENALOG) 0.1 % Apply 1 application topically 2 (two) times daily.     TRULICITY 1.5 XY/5.8PF SOPN Inject into the skin.     vitamin B-12 (CYANOCOBALAMIN) 1000 MCG tablet Take 1,000 mcg by mouth daily.     vitamin C (ASCORBIC ACID) 500 MG tablet Take 500 mg by mouth daily. Power C immune support     fluticasone (FLOVENT HFA) 220 MCG/ACT inhaler 2 puffs twice daily with spacer2 puffs twice daily with spacer (Patient not taking: Reported on 09/15/2021) 1 each 1  gabapentin (NEURONTIN) 600 MG tablet Take 600 mg by mouth 3 (three) times daily. (Patient not taking: Reported on 09/15/2021)     Tiotropium Bromide Monohydrate (SPIRIVA RESPIMAT) 1.25 MCG/ACT AERS Inhale 2 puffs into the lungs daily.  (Patient not taking: Reported on 09/15/2021) 4 g 1   No current facility-administered medications for this visit.   Allergies: Allergies  Allergen Reactions   Ativan [Lorazepam] Hives   Phenergan [Promethazine Hcl] Hives   I reviewed her past medical history, social history, family history, and environmental history and no significant changes have been reported from previous visits.  Review of Systems  Objective:  Physical exam not obtained as encounter was done via telephone.   Previous notes and tests were reviewed.  I discussed the assessment and treatment plan with the patient. The patient was provided an opportunity to ask questions and all were answered. The patient agreed with the plan and demonstrated an understanding of the instructions.   The patient was advised to call back or seek an in-person evaluation if the symptoms worsen or if the condition fails to improve as anticipated.  I provided 24 minutes of non-face-to-face time during this encounter.  It was my pleasure to participate in Ferdinand Irving's care today. Please feel free to contact me with any questions or concerns.   Sincerely,  Valentina Shaggy, MD

## 2021-09-17 ENCOUNTER — Telehealth: Payer: Self-pay | Admitting: *Deleted

## 2021-09-17 ENCOUNTER — Encounter: Payer: Self-pay | Admitting: Allergy & Immunology

## 2021-09-17 NOTE — Telephone Encounter (Signed)
-----   Message from Valentina Shaggy, MD sent at 09/17/2021  1:10 PM EST ----- Can someone call and see how she is feeling?

## 2021-09-17 NOTE — Telephone Encounter (Signed)
Called and left a voicemail asking for patient to return call to see how she is doing.  

## 2021-09-20 NOTE — Telephone Encounter (Signed)
Called and left a voicemail asking for patient to return call to discuss.  °

## 2021-09-20 NOTE — Telephone Encounter (Signed)
Patient called back and stated that she is doing much better and is coughing everything up and is feeling great and was very thankful.

## 2021-09-21 ENCOUNTER — Other Ambulatory Visit: Payer: Self-pay | Admitting: "Endocrinology

## 2021-09-21 NOTE — Telephone Encounter (Signed)
Great - thanks for following up!   Salvatore Marvel, MD [facility]

## 2021-09-27 ENCOUNTER — Other Ambulatory Visit: Payer: Medicare Other

## 2021-09-28 ENCOUNTER — Ambulatory Visit: Payer: Medicare Other | Admitting: Nutrition

## 2021-09-28 ENCOUNTER — Ambulatory Visit: Payer: Medicare Other | Admitting: "Endocrinology

## 2021-09-29 ENCOUNTER — Ambulatory Visit (INDEPENDENT_AMBULATORY_CARE_PROVIDER_SITE_OTHER): Payer: Medicare Other | Admitting: Clinical

## 2021-09-29 ENCOUNTER — Other Ambulatory Visit: Payer: Self-pay

## 2021-09-29 DIAGNOSIS — F4310 Post-traumatic stress disorder, unspecified: Secondary | ICD-10-CM

## 2021-09-29 NOTE — Progress Notes (Signed)
Virtual Visit via Telephone Note  I connected with Laura [name] on 09/29/21 at 9:30AM by telephone and verified that I am speaking with the correct person using two identifiers.  Location: Patient: Home Provider: Office   I discussed the limitations, risks, security and privacy concerns of performing an evaluation and management service by telephone and the availability of in person appointments. I also discussed with the patient that there may be a patient responsible charge related to this service. The patient expressed understanding and agreed to proceed.   THERAPIST PROGRESS NOTE   Session Time: 9:30 AM-:10:00 AM   Participation Level: Active   Behavioral Response: CasualAlertDepressed   Type of Therapy: Individual Therapy   Treatment Goals addressed: Coping   Interventions: CBT, DBT, Solution Focused, Strength-based and Supportive   Summary: Laura [name] is a 60 y.o. female who presents with PTSD. The OPT therapist worked with the patient for her ongoing OPT treatment session. The OPT therapist utilized Motivational Interviewing to assist in creating therapeutic repore. The patient in the session was engaged and work in collaboration giving feedback about her triggers and symptoms over the past few weeks. The patient currently is in [place] with family and was scheduled to return home to [facility], however, missed her flight and will now be staying over the Christmas holiday with family in [place]. The patient has had difficulty keeping appointments including the last several OPT appointments inclkuding today contacting the OPT therapist office 25 minutes after her scheduled appointments, this difficulty in part due to the patient being in a different time zone.The patient spoke about having improved interactions with her daughter who she is currently staying with in [place].The patient will be going from [place] to [place] on December 29th. The OPT therapist utilized Cognitive  Behavioral Therapy through cognitive restructuring as well as worked with the patient on coping strategies to assist in management of mental health symptoms. The patient reviewed her ongoing work on her physical health. The OPT therapist provided emotional support and reviewed goal of getting patient back to baseline post transition and re-acclimation to [place]. The OPT therapist placed emphasis on the patient keeping all upcoming health appointments and adhering to ongoing health treatment recommendations.    Suicidal/Homicidal: Nowithout intent/plan   Therapist Response:The OPT therapist worked with the patient for the patients scheduled session. The patient was engaged in her session and gave feedback in relation to triggers, symptoms, and behavior responses over the past few weeks. The OPT therapist worked with the patient utilizing an in session Cognitive Behavioral Therapy exercise. The patient was responsive in the session and verbalized, " I didn't end up going out to [place] and I missed my flight the other day to return to [place] so I am going to be spending Christmas with family here in [place] and I have a new flight for Dec 29th". The patient spoke about her struggle interactions with her [place] family as being much improved. The OPT therapist provided psycho-education throughout the session including reviewing the link between physical health and mental health. The OPT therapist will continue treatment work with the patient in her next scheduled session   Plan: Return again in 2/3 weeks.   Diagnosis:      Axis I:Post Traumatic Stress Disorder                           Axis II: No diagnosis   I discussed the assessment and treatment plan with the  patient. The patient was provided an opportunity to ask questions and all were answered. The patient agreed with the plan and demonstrated an understanding of the instructions.   The patient was advised to call back or seek an in-person  evaluation if the symptoms worsen or if the condition fails to improve as anticipated.   I provided 30 minutes of non-face-to-face time during this encounter.   [name], LCSW   09/29/2021

## 2021-09-30 ENCOUNTER — Other Ambulatory Visit: Payer: Self-pay | Admitting: "Endocrinology

## 2021-10-18 ENCOUNTER — Ambulatory Visit
Admission: RE | Admit: 2021-10-18 | Discharge: 2021-10-18 | Disposition: A | Discharge status: Home / Self Care | Payer: Commercial Managed Care - HMO | Source: Ambulatory Visit | Attending: Podiatry | Admitting: Podiatry

## 2021-10-18 DIAGNOSIS — S99921A Unspecified injury of right foot, initial encounter: Secondary | ICD-10-CM

## 2021-10-18 IMAGING — MR MR FOOT*R* W/O CM
5 series · 40 of 40 positions shown · non-contrast
Comparison: X-ray foot [DATE].

CLINICAL DATA: Right midfoot pain with discoloration in big toe
since [REDACTED] related to dancing. Clinical concern for capsular
tear, hammertoe deformity second toe and plantar plate.

EXAM:
MRI OF THE RIGHT FOREFOOT WITHOUT CONTRAST
TECHNIQUE: Multiplanar, multisequence MR imaging of the right forefoot was
performed. No intravenous contrast was administered.

[Series 5: T1 · coronal · right · 3.0mm · 0.31mm/px · 11 of 40 slices shown (1 of 2)]
[im 1/40]
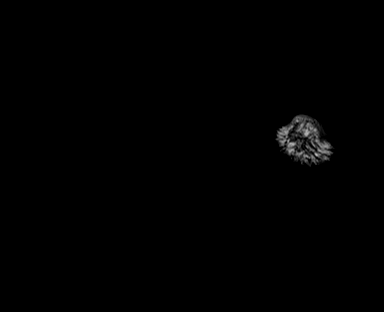
[im 4/40]
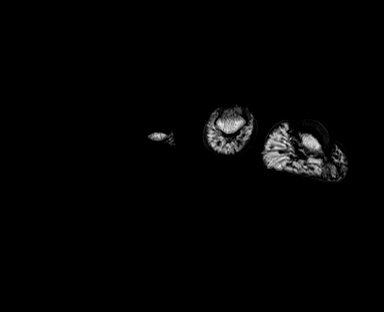
[im 8/40]
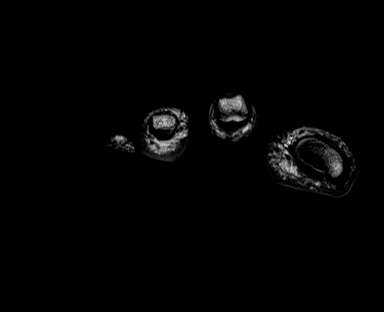
[im 12/40]
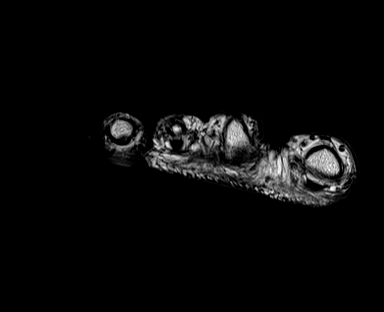
[im 16/40]
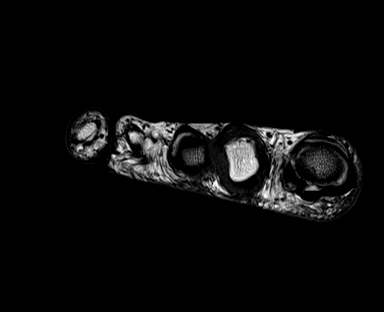
[im 20/40]
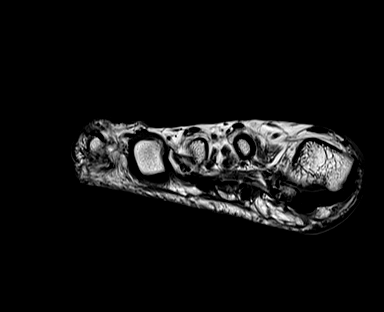
[im 24/40]
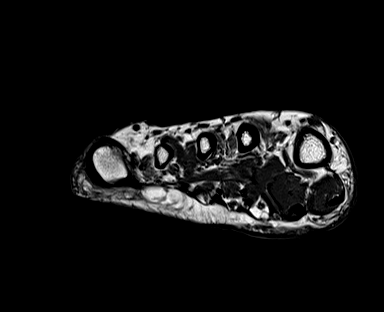
[im 28/40]
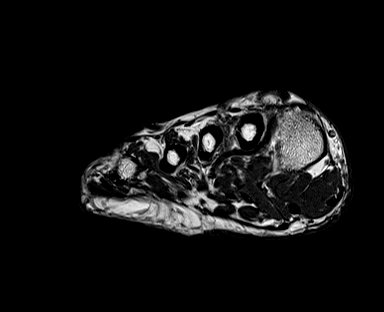
[im 32/40]
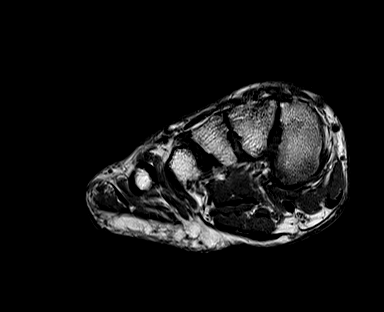
[im 36/40]
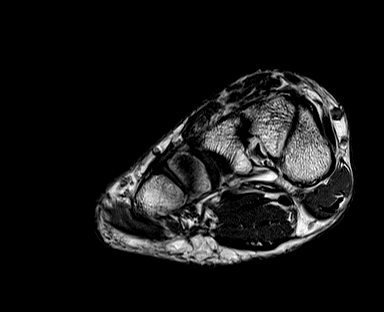
[im 40/40]
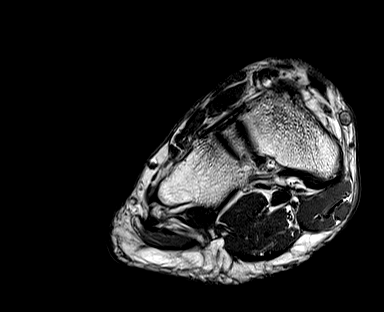

[Series 6: T2 fat-sat · coronal · right · 3.0mm · 0.31mm/px · 10 of 40 slices shown (1 of 2)]
[im 1/40]
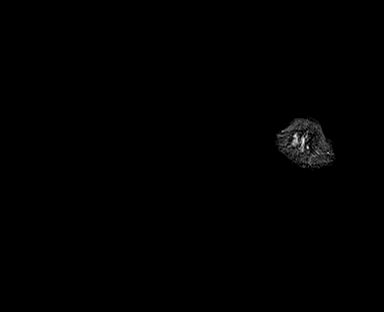
[im 5/40]
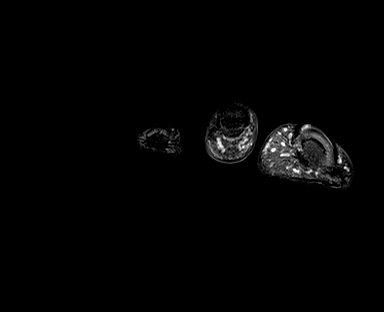
[im 9/40]
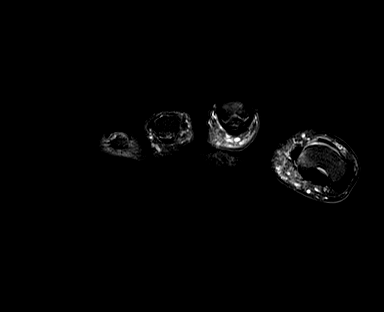
[im 14/40]
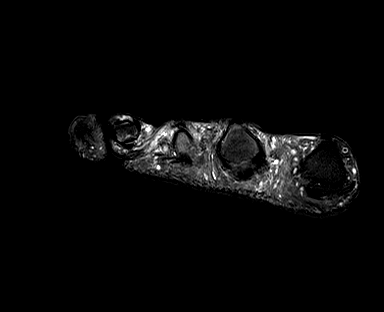
[im 18/40]
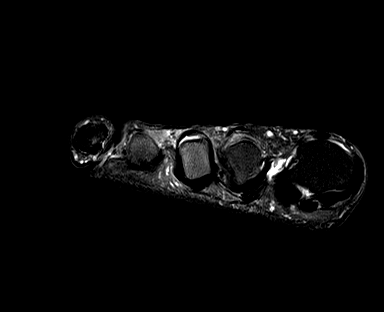
[im 22/40]
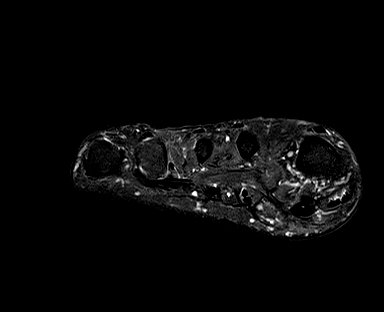
[im 27/40]
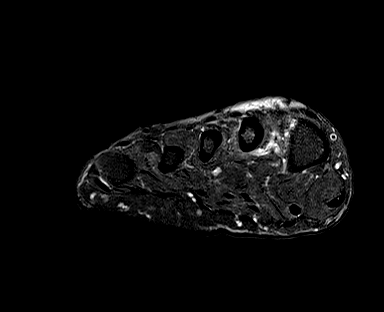
[im 31/40]
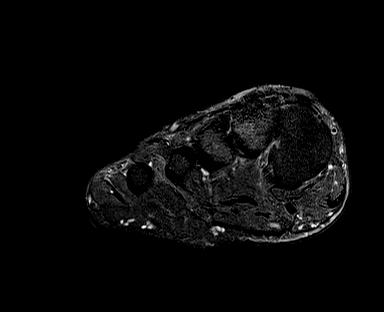
[im 35/40]
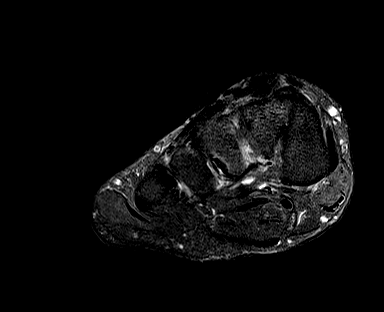
[im 40/40]
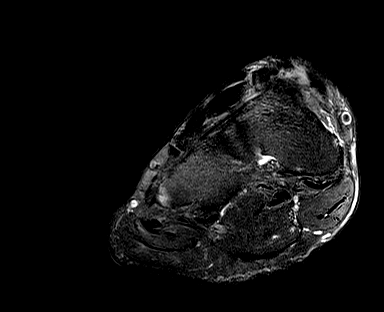

[Series 7: STIR · sagittal · right · 3.0mm · 0.70mm/px · 7 of 29 slices shown]
[im 1/29]
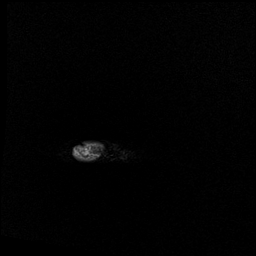
[im 5/29]
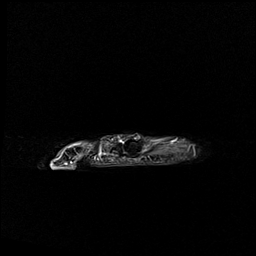
[im 10/29]
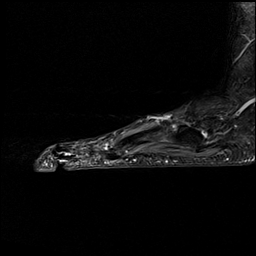
[im 15/29]
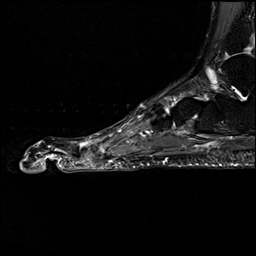
[im 19/29]
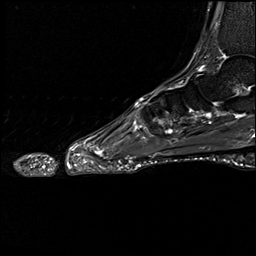
[im 24/29]
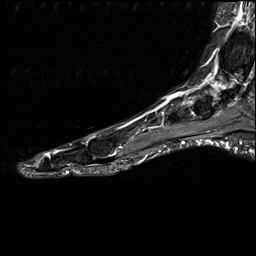
[im 29/29]
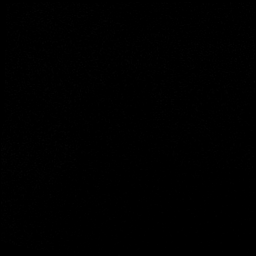

[Series 8: T1 · axial · right · 3.0mm · 0.47mm/px · z∈[-176,-93]mm · 6 of 23 slices shown (2 of 2)]
[im 1/23]
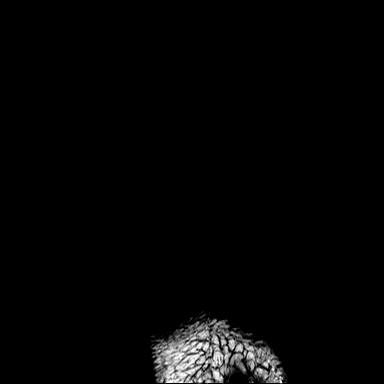
[im 5/23]
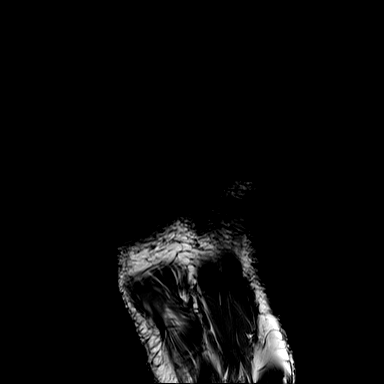
[im 9/23]
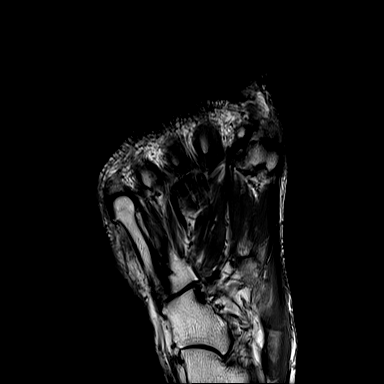
[im 14/23]
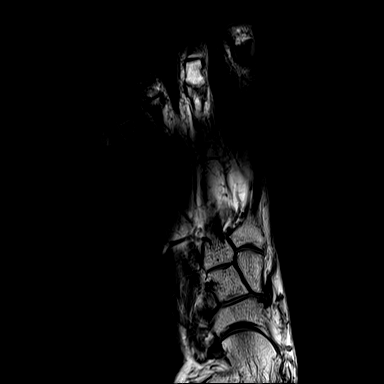
[im 18/23]
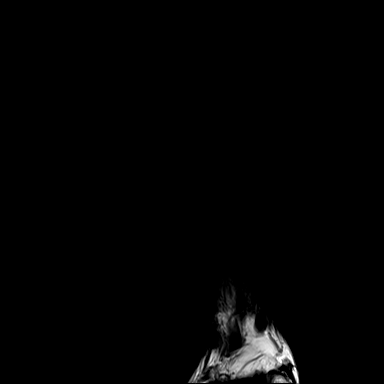
[im 23/23]
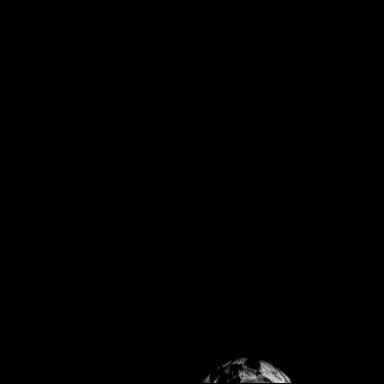

[Series 9: T2 fat-sat · axial · right · 3.0mm · 0.47mm/px · z∈[-176,-93]mm · 6 of 23 slices shown (2 of 2)]
[im 1/23]
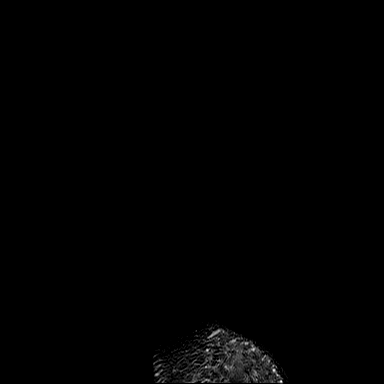
[im 5/23]
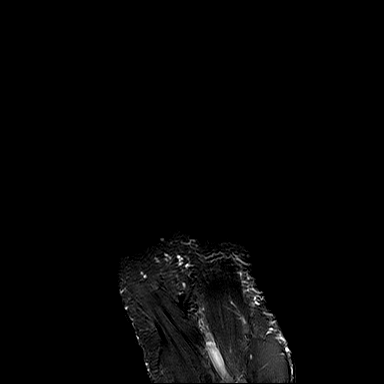
[im 9/23]
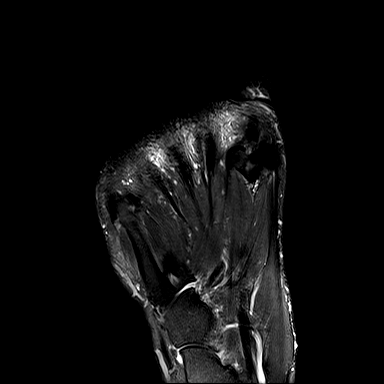
[im 14/23]
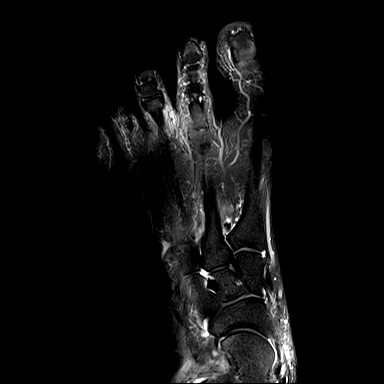
[im 18/23]
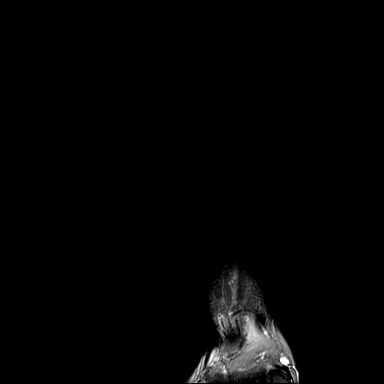
[im 23/23]
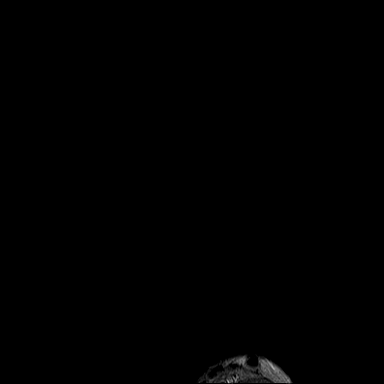

[40 of 40 positions shown; findings below may reference images not displayed]

FINDINGS: Bones/Joint/Cartilage

No evidence of acute fracture. There is scattered mild
interphalangeal joint degenerative change. There is a mild hammertoe
deformity of the second toe. Absent medial sesamoid of the great toe
MTP. There is capsular thickening of the plantar great toe MTP
medially.

Ligaments

There is a probable degenerative plantar plate tear of the first MTP
joint. There is no evidence of plantar plate tear of the lesser
digits. There is intermediate signal within the lateral proper
collateral ligament of the second MTP.

Muscles and Tendons

No significant muscle atrophy or edema.

Soft tissues

No focal fluid collection. No evidence of intermetatarsal neuroma or
bursitis.
IMPRESSION: 1. Mild hammertoe morphology of the second toe, without definitive
plantar plate tear at the second MTP joint. Intermediate signal
within the lateral proper collateral ligament of the second MTP
joint consistent with chronic low-grade injury/degeneration.
2. Probable degenerative plantar plate tear of the first MTP joint.
Absent medial sesamoid of the first MTP with capsular thickening of
the plantar great toe MTP medially.
3. Scattered mild interphalangeal joint degenerative arthritis.
4. No evidence of acute fracture. No acute tendon tear or
significant tenosynovitis.

## 2021-10-18 NOTE — Progress Notes (Signed)
History of Present Illness: Laura [name] is a 61 y.o. year old female here for follow-up of lower urinary tract symptomatology.  9.20.2022: 61 y.o. year old female with multiple medical issues, on multiple medications, presents with a several year history of urinary frequency, urgency, urgency incontinence.  No frequent urinary tract infections.  She has a good voiding stream.  She does not feel like she has incomplete emptying.  She has not seen a urologist before.  She has not been on any medical therapy.  Given overactive bladder guide sheet as well as eventual trial of Gemtesa.  1.10.2023: She has had no improvement in lower urinary tract symptomatology.  She did not apparently get her guide sheet but did take the trial course of Gemtesa.  Still with urgency, frequency, urgency incontinence, mainly at night.  She wets herself every night.  She does not wear incontinence pads.  Past Medical History:  Diagnosis Date   Asthma    Atrial fibrillation ([place])    Bipolar disorder ([place])    Bronchiectasis ([place])    Carpal tunnel syndrome of right wrist    CHF (congestive heart failure) (HCC)    Complication of anesthesia    "my Dr in [place] told me I had an allergic reaction to the combination of the pain meds and the anesthesia" she does not remember what happened but "I eneded up in the ICU for 5 days".   H/O congenital atrial septal defect (ASD) repair    Hypogammaglobulinemia ([place])    Hypothyroid    IgE deficiency (HCC)    Neuropathy    Neuropathy    Osteoarthritis    Pinched nerve in neck    PTSD (post-traumatic stress disorder)    Recurrent sinusitis    Short-term memory loss    Sleep apnea    TIA (transient ischemic attack)    Tremors of nervous system    seeing PCP for this.   Type 2 diabetes mellitus (HCC)    Urticaria    Wound infection     Past Surgical History:  Procedure Laterality Date   ASD REPAIR  1968   BIOPSY  01/26/2021   Procedure: BIOPSY;  Surgeon: [name], DO;  [facility];  Service: Endoscopy;;  gastric   CARDIAC SURGERY     CARDIOVERSION     CARDIOVERSION N/A 08/29/2018   Procedure: CARDIOVERSION;  Surgeon: Arnoldo Lenis, MD;  [facility];  Service: Endoscopy;  Laterality: N/A;   [place]   COLONOSCOPY WITH PROPOFOL N/A 01/26/2021   Procedure: COLONOSCOPY WITH PROPOFOL;  Surgeon: [name], DO;  [facility];  Service: Endoscopy;  Laterality: N/A;  am appt, diabetic   ESOPHAGOGASTRODUODENOSCOPY (EGD) WITH PROPOFOL N/A 01/26/2021   Procedure: ESOPHAGOGASTRODUODENOSCOPY (EGD) WITH PROPOFOL;  Surgeon: [name], DO;  [facility];  Service: Endoscopy;  Laterality: N/A;   LEFT HEART CATH AND CORONARY ANGIOGRAPHY N/A 08/30/2019   Procedure: LEFT HEART CATH AND CORONARY ANGIOGRAPHY;  Surgeon: [name], MD;  Location: [facility];  Service: Cardiovascular;  Laterality: N/A;   NASAL SINUS SURGERY  2017   POLYPECTOMY  01/26/2021   Procedure: POLYPECTOMY;  Surgeon: [name], DO;  [facility];  Service: Endoscopy;;  colon    Home Medications:  (Not in a hospital admission)   Allergies:  Allergies  Allergen Reactions   Ativan [Lorazepam] Hives   Phenergan [Promethazine Hcl] Hives  Family History  Problem Relation Age of Onset   Hypertension Mother    Stroke Mother    Kidney disease Mother    Heart attack Mother    Alcohol abuse Mother    Other Father        ?colon cancer   Kidney disease Sister    Heart disease Brother    Stroke Maternal Aunt    Heart disease Maternal Aunt    Hypertension Sister    Bipolar disorder Sister    Thyroid disease Sister    Prostate cancer Brother    Thyroid disease Daughter    Hashimoto's thyroiditis Daughter    Celiac disease Daughter    Allergic rhinitis Daughter    Asthma Neg Hx     Social History:  reports that she quit smoking about 15 years ago. Her  smoking use included cigarettes. She started smoking about 43 years ago. She has a 28.00 pack-year smoking history. She has never used smokeless tobacco. She reports that she does not currently use alcohol. She reports that she does not currently use drugs.  ROS: A complete review of systems was performed.  All systems are negative except for pertinent findings as noted.  Physical Exam:  Vital signs in last 24 hours: @VSRANGES @ General:  Alert and oriented, No acute distress HEENT: Normocephalic, atraumatic Neck: No JVD or lymphadenopathy Cardiovascular: Regular rate  Lungs: Normal inspiratory/expiratory excursion Abdomen: Soft, nontender, nondistended, no abdominal masses Back: No CVA tenderness Extremities: No edema Neurologic: Grossly intact  I have reviewed prior pt notes  I have reviewed urinalysis results  I have independently reviewed prior imaging--bladder scan volume 0    Impression/Assessment:  Overactive bladder-wet, significant symptomatology.  She empties out completely.  Medical therapy has so far failed  Plan:  1.  I did give her an overactive bladder guide sheet again  2.  Long discussion held with the patient about further management-pharmacologic management, neuromodulation and just staying where she is  3.  Seeing that she is on multiple medications, I do not think that is where the answer lies at this point.  I will set her up to start 12-week course of PTNS.  I will see back in 4 months to recheck  Jorja Loa 10/18/2021, 8:11 PM  Lillette Boxer. Edgard Debord MD

## 2021-10-19 ENCOUNTER — Ambulatory Visit (INDEPENDENT_AMBULATORY_CARE_PROVIDER_SITE_OTHER): Payer: Medicare Other | Admitting: Urology

## 2021-10-19 ENCOUNTER — Other Ambulatory Visit: Payer: Self-pay

## 2021-10-19 ENCOUNTER — Encounter: Payer: Self-pay | Admitting: Urology

## 2021-10-19 VITALS — BP 144/78 | HR 71 | Wt 206.6 lb

## 2021-10-19 DIAGNOSIS — R350 Frequency of micturition: Secondary | ICD-10-CM

## 2021-10-19 DIAGNOSIS — R32 Unspecified urinary incontinence: Secondary | ICD-10-CM

## 2021-10-19 DIAGNOSIS — R3915 Urgency of urination: Secondary | ICD-10-CM | POA: Diagnosis not present

## 2021-10-19 LAB — URINALYSIS, ROUTINE W REFLEX MICROSCOPIC
Bilirubin, UA: NEGATIVE
Glucose, UA: NEGATIVE
Ketones, UA: NEGATIVE
Leukocytes,UA: NEGATIVE
Nitrite, UA: NEGATIVE
Protein,UA: NEGATIVE
RBC, UA: NEGATIVE
Specific Gravity, UA: 1.015 (ref 1.005–1.030)
Urobilinogen, Ur: 0.2 mg/dL (ref 0.2–1.0)
pH, UA: 5.5 (ref 5.0–7.5)

## 2021-10-19 LAB — BLADDER SCAN AMB NON-IMAGING

## 2021-10-19 NOTE — Progress Notes (Signed)
Urological Symptom Review  Patient is experiencing the following symptoms: Frequent urination Hard to postpone urination Get up at night to urinate Leakage of urine   Review of Systems  Gastrointestinal (upper)  : Negative for upper GI symptoms  Gastrointestinal (lower) : Diarrhea Constipation  Constitutional : Negative for symptoms  Skin: Negative for skin symptoms  Eyes: Negative for eye symptoms  Ear/Nose/Throat : Sinus problems  Hematologic/Lymphatic: Negative for Hematologic/Lymphatic symptoms  Cardiovascular : Negative for cardiovascular symptoms  Respiratory : Cough Shortness of breath  Endocrine: Excessive thirst  Musculoskeletal: Back pain Joint pain  Neurological: Dizziness  Psychologic: Depression Anxiety

## 2021-10-21 ENCOUNTER — Other Ambulatory Visit: Payer: Self-pay

## 2021-10-21 ENCOUNTER — Telehealth: Payer: Self-pay | Admitting: "Endocrinology

## 2021-10-21 MED ORDER — "PEN NEEDLES 3/16"" 31G X 5 MM MISC": 0 refills | Status: AC

## 2021-10-21 MED ORDER — TRULICITY 1.5 MG/0.5ML ~~LOC~~ SOAJ: SUBCUTANEOUS | 0 refills | Status: DC

## 2021-10-21 NOTE — Telephone Encounter (Signed)
Prescriptions have been sent to the pharmacy requested.

## 2021-10-21 NOTE — Telephone Encounter (Signed)
Pt is calling and requesting a refill.  [facility] Lanc[facility]  [id number] - [place], [address] Phone:  [phone]  Fax:  [phone]

## 2021-10-22 ENCOUNTER — Other Ambulatory Visit (HOSPITAL_COMMUNITY)
Admission: RE | Admit: 2021-10-22 | Discharge: 2021-10-22 | Disposition: A | Discharge status: Home / Self Care | Payer: Commercial Managed Care - HMO | Source: Ambulatory Visit | Attending: "Endocrinology | Admitting: "Endocrinology

## 2021-10-22 ENCOUNTER — Other Ambulatory Visit: Payer: Self-pay

## 2021-10-22 DIAGNOSIS — E538 Deficiency of other specified B group vitamins: Secondary | ICD-10-CM | POA: Insufficient documentation

## 2021-10-22 DIAGNOSIS — E039 Hypothyroidism, unspecified: Secondary | ICD-10-CM | POA: Diagnosis present

## 2021-10-22 DIAGNOSIS — E559 Vitamin D deficiency, unspecified: Secondary | ICD-10-CM | POA: Insufficient documentation

## 2021-10-22 LAB — CBC WITH DIFFERENTIAL/PLATELET
Abs Immature Granulocytes: 0.02 10*3/uL (ref 0.00–0.07)
Basophils Absolute: 0 10*3/uL (ref 0.0–0.1)
Basophils Relative: 0 %
Eosinophils Absolute: 0 10*3/uL (ref 0.0–0.5)
Eosinophils Relative: 0 %
HCT: 40.5 % (ref 36.0–46.0)
Hemoglobin: 14.4 g/dL (ref 12.0–15.0)
Immature Granulocytes: 0 %
Lymphocytes Relative: 37 %
Lymphs Abs: 1.8 10*3/uL (ref 0.7–4.0)
MCH: 33.3 pg (ref 26.0–34.0)
MCHC: 35.6 g/dL (ref 30.0–36.0)
MCV: 93.5 fL (ref 80.0–100.0)
Monocytes Absolute: 0.3 10*3/uL (ref 0.1–1.0)
Monocytes Relative: 7 %
Neutro Abs: 2.7 10*3/uL (ref 1.7–7.7)
Neutrophils Relative %: 56 %
Platelets: 155 10*3/uL (ref 150–400)
RBC: 4.33 MIL/uL (ref 3.87–5.11)
RDW: 12.6 % (ref 11.5–15.5)
WBC: 4.9 10*3/uL (ref 4.0–10.5)
nRBC: 0 % (ref 0.0–0.2)

## 2021-10-22 LAB — VITAMIN B12: Vitamin B-12: 640 pg/mL (ref 180–914)

## 2021-10-22 LAB — COMPREHENSIVE METABOLIC PANEL
ALT: 15 U/L (ref 0–44)
AST: 14 U/L — ABNORMAL LOW (ref 15–41)
Albumin: 4.1 g/dL (ref 3.5–5.0)
Alkaline Phosphatase: 81 U/L (ref 38–126)
Anion gap: 8 (ref 5–15)
BUN: 40 mg/dL — ABNORMAL HIGH (ref 6–20)
CO2: 27 mmol/L (ref 22–32)
Calcium: 9 mg/dL (ref 8.9–10.3)
Chloride: 104 mmol/L (ref 98–111)
Creatinine, Ser: 0.79 mg/dL (ref 0.44–1.00)
GFR, Estimated: >60 mL/min (ref >60)
Glucose, Bld: 133 mg/dL — ABNORMAL HIGH (ref 70–99)
Potassium: 3.7 mmol/L (ref 3.5–5.1)
Sodium: 139 mmol/L (ref 135–145)
Total Bilirubin: 0.6 mg/dL (ref 0.3–1.2)
Total Protein: 7.2 g/dL (ref 6.5–8.1)

## 2021-10-22 LAB — TSH: TSH: 0.514 u[IU]/mL (ref 0.350–4.500)

## 2021-10-22 LAB — T4, FREE: Free T4: 1.13 ng/dL — ABNORMAL HIGH (ref 0.61–1.12)

## 2021-10-22 LAB — MAGNESIUM: Magnesium: 2.1 mg/dL (ref 1.7–2.4)

## 2021-10-22 LAB — VITAMIN D 25 HYDROXY (VIT D DEFICIENCY, FRACTURES): Vit D, 25-Hydroxy: 41.36 ng/mL (ref 30–100)

## 2021-10-24 LAB — IGG, IGA, IGM
IgA: 42 mg/dL — ABNORMAL LOW (ref 87–352)
IgG (Immunoglobin G), Serum: 1227 mg/dL (ref 586–1602)
IgM (Immunoglobulin M), Srm: 73 mg/dL (ref 26–217)

## 2021-10-25 ENCOUNTER — Other Ambulatory Visit: Payer: Self-pay

## 2021-10-25 ENCOUNTER — Ambulatory Visit (INDEPENDENT_AMBULATORY_CARE_PROVIDER_SITE_OTHER): Payer: 59 | Admitting: Clinical

## 2021-10-25 DIAGNOSIS — F4310 Post-traumatic stress disorder, unspecified: Secondary | ICD-10-CM | POA: Diagnosis not present

## 2021-10-25 NOTE — Progress Notes (Signed)
Virtual Visit via Video Note  I connected with Laura [name] on 10/25/21 at  9:00 AM EST by a video enabled telemedicine application and verified that I am speaking with the correct person using two identifiers.  Location: Patient: Home Provider: Office   I discussed the limitations of evaluation and management by telemedicine and the availability of in person appointments. The patient expressed understanding and agreed to proceed.  THERAPIST PROGRESS NOTE   Session Time: 9:00 AM-:9:30 AM   Participation Level: Active   Behavioral Response: CasualAlertDepressed   Type of Therapy: Individual Therapy   Treatment Goals addressed: Coping   Interventions: CBT, DBT, Solution Focused, Strength-based and Supportive   Summary: Laura [name] is a 61 y.o. female who presents with PTSD. The OPT therapist worked with the patient for her ongoing OPT treatment session. The OPT therapist utilized Motivational Interviewing to assist in creating therapeutic repore. The patient in the session was engaged and work in collaboration giving feedback about her triggers and symptoms over the past few weeks. The patient spoke about being happy that she is back at home in [place] after having a extended trip out in [place] over the past several weeks.The patient spoke about the reminder of her trip with her daughter in [place] and her experience in traveling back to [place] as well as her re-acclimate to being back home. The OPT therapist utilized Cognitive Behavioral Therapy through cognitive restructuring as well as worked with the patient on coping strategies to assist in management of mental health symptoms. The patient reviewed her ongoing work on her physical health. The OPT therapist provided emotional support and worked with the patient on the goal of getting patient back to baseline post transition and re-acclimation to [place]. The OPT therapist placed emphasis on the patient keeping all upcoming  health appointments and adhering to ongoing health treatment recommendations.    Suicidal/Homicidal: Nowithout intent/plan   Therapist Response:The OPT therapist worked with the patient for the patients scheduled session. The patient was engaged in her session and gave feedback in relation to triggers, symptoms, and behavior responses over the past few weeks. The OPT therapist worked with the patient utilizing an in session Cognitive Behavioral Therapy exercise. The patient was responsive in the session and verbalized, " I am so happy to be back its going well now that I am back home and back in [place]". The patient spoke about her struggle interactions with her [place] family throughout her trip as well as the trip not going as planned due to not going to [place] but being stuck in [place] and then missing her flight and having to stay longer and finally after christmas being able to come home to [place]. The patient noted the trip was more overwhelming than anticipated and it will be awhile before she tries/attempts traveling on this much of a complicated multi-state involvement. The patient since coming back to [place] has rejoined the [facility]. The patient has recently starting talking to her middle daughter [name]) and is currently in negotiations with her to be able to see her grandchildren. The patient spoke about her upcoming Birthday in February.The OPT therapist will continue treatment work with the patient in her next scheduled session.   Plan: Return again in 2/3 weeks.   Diagnosis:      Axis I:Post Traumatic Stress Disorder                           Axis  II: No diagnosis   I discussed the assessment and treatment plan with the patient. The patient was provided an opportunity to ask questions and all were answered. The patient agreed with the plan and demonstrated an understanding of the instructions.   The patient was advised to call back or seek an in-person evaluation if the  symptoms worsen or if the condition fails to improve as anticipated.   I provided 30 minutes of non-face-to-face time during this encounter.   [name], LCSW   10/25/2021

## 2021-10-26 ENCOUNTER — Telehealth (INDEPENDENT_AMBULATORY_CARE_PROVIDER_SITE_OTHER): Payer: 59 | Admitting: Psychiatry

## 2021-10-26 ENCOUNTER — Telehealth: Payer: Self-pay

## 2021-10-26 ENCOUNTER — Encounter: Payer: Self-pay | Admitting: Psychiatry

## 2021-10-26 ENCOUNTER — Other Ambulatory Visit: Payer: Self-pay

## 2021-10-26 ENCOUNTER — Telehealth: Payer: Self-pay | Admitting: Allergy & Immunology

## 2021-10-26 DIAGNOSIS — F0630 Mood disorder due to known physiological condition, unspecified: Secondary | ICD-10-CM

## 2021-10-26 DIAGNOSIS — F3341 Major depressive disorder, recurrent, in partial remission: Secondary | ICD-10-CM | POA: Diagnosis not present

## 2021-10-26 MED ORDER — DIVALPROEX SODIUM ER 500 MG PO TB24: ORAL_TABLET | ORAL | 0 refills | Status: DC

## 2021-10-26 NOTE — Patient Instructions (Addendum)
1. Continue Depakote 500 mg in AM, 1000 mg at night 2. Next appointment: 4/11 at 1:40, video 3. Obtain lab ([facility])

## 2021-10-26 NOTE — Telephone Encounter (Signed)
Left a message for patient to call the office in regards to Dr. Gillermina Hu recommendation.

## 2021-10-26 NOTE — Telephone Encounter (Signed)
Called and spoke with patient in regards to her labs and she was concerned about the elevated BUN, she states that she has not changed her protein intake that the only thing that has changed is she is drinking milk with her medications to help coat her stomach. Is there any recommendation on anything she needs to do differently or follow up with the elevated BUN?

## 2021-10-26 NOTE — Telephone Encounter (Signed)
Patient would like to speak to Dr. Ernst Bowler about her test results.   Best contact number: [phone]

## 2021-10-26 NOTE — Telephone Encounter (Signed)
Called and left a voicemail asking for patient to return call to discuss.  °
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
## 2021-10-26 NOTE — Telephone Encounter (Signed)
Called and LVM for patient asking if she could bring her SD card from CPAP machine to appt. Nothing further needed.

## 2021-10-26 NOTE — Telephone Encounter (Signed)
We can recheck in a month or so to see where it is trending. I do not think that she needs to lose sleep over this.   Salvatore Marvel, MD Allergy and [place] [facility] [place]

## 2021-10-27 ENCOUNTER — Ambulatory Visit (INDEPENDENT_AMBULATORY_CARE_PROVIDER_SITE_OTHER): Payer: Medicare Other | Admitting: Pulmonary Disease

## 2021-10-27 ENCOUNTER — Other Ambulatory Visit: Payer: Self-pay

## 2021-10-27 ENCOUNTER — Encounter: Payer: Self-pay | Admitting: Pulmonary Disease

## 2021-10-27 VITALS — BP 138/78 | HR 64 | Temp 97.8°F | Ht 64.0 in | Wt 205.4 lb

## 2021-10-27 DIAGNOSIS — Z9989 Dependence on other enabling machines and devices: Secondary | ICD-10-CM

## 2021-10-27 DIAGNOSIS — G4733 Obstructive sleep apnea (adult) (pediatric): Secondary | ICD-10-CM

## 2021-10-27 DIAGNOSIS — J4550 Severe persistent asthma, uncomplicated: Secondary | ICD-10-CM

## 2021-10-27 DIAGNOSIS — Z23 Encounter for immunization: Secondary | ICD-10-CM | POA: Diagnosis not present

## 2021-10-27 MED ORDER — FLUTICASONE FUROATE-VILANTEROL 100-25 MCG/ACT IN AEPB: 1.0000 | INHALATION_SPRAY | Freq: Every day | RESPIRATORY_TRACT | 11 refills | Status: DC

## 2021-10-27 NOTE — Assessment & Plan Note (Signed)
Unable to obtain download on her CPAP machine. We will proceed with home sleep testing since she we cannot locate her previous study. She will contact DME about changing humidity settings on the machine hopefully this should help with preventing her mask from fogging up  Weight loss encouraged, compliance with goal of at least 4-6 hrs every night is the expectation. Advised against medications with sedative side effects Cautioned against driving when sleepy - understanding that sleepiness will vary on a day to day basis

## 2021-10-27 NOTE — Assessment & Plan Note (Signed)
Seems better controlled now that she is on Breo. Do not feel strongly about Spiriva and she can discontinue, continue albuterol on as-needed basis. She will get back on Fasenra, prescribed by allergist but we can help facilitate if needed

## 2021-10-27 NOTE — Progress Notes (Signed)
° °  Subjective:    Patient ID: Laura [name], female    DOB: 06-May-1961, 61 y.o.   MRN: [id number]  HPI  61 yo female with bronchiectasis, CVID, OSA who presents to establish care in [place] office Former smoker. 28 pack years. Quit in 2008. Allergy with Dr. Ernst Bowler - on subcu immunoglobulin injections q2weeks and Fasenra in [place] office.    PMH - ASD s/p repair, paroxysmal atrial fibrillation s/p DCCV, insulin-dependent diabetes mellitus, PTSD, bipolar disorder, hypothyroidism, hyperlipidemia, OSA on CPAP, essential hypertension, GERD CVID  COVID infection Jan 2022 & again 05/2021  Chief Complaint  Patient presents with   Follow-up    CPAP is working okay but patient would like to discuss new cpap machine. Mask has been fogging with humidifier    Last office visit 06/2021 we gave her a prescription for Breo.  This is worked well for her. She went to [place] to be with her daughter and ended up staying for more than 8 weeks.  She has missed Berna Bue and is trying to get back on track. Asthma has stayed under control. Her CPAP machine is working okay but the mask seems to fogged up, she wonders if she needs a new machine. We have been unable to locate her previous sleep study which was done in [place], she had the machine for more than 5 years.  She seems to be compliant and CPAP is helped  Medication list reviewed, extensive polypharmacy  Significant tests/ events reviewed  CTA chest 03/2021 >> RT hilar LN 13 mm, no nodule   CTA 09/18/19 - No pulmonary embolism. Nodule ~80mm in right lung base. Minimal right pleural effusion.   04/2021 esophagram >> small hiatal hernia   PFT: 04/2021 Spirometry ratio 70, FEV1 1.60/64%, FVC 2.8/71%   11/2020  Spirometry: results abnormal (FEV1: 1.61/64%, FVC: 2.14/66%, FEV1/FVC: 75%).   06/21/19 Spirometry FVC 2.2 (70%) FEV1 1.5 (64%) Ratio 68  Interpretation: Moderate obstructive lung defect is present      Review of Systems neg for  any significant sore throat, dysphagia, itching, sneezing, nasal congestion or excess/ purulent secretions, fever, chills, sweats, unintended wt loss, pleuritic or exertional cp, hempoptysis, orthopnea pnd or change in chronic leg swelling. Also denies presyncope, palpitations, heartburn, abdominal pain, nausea, vomiting, diarrhea or change in bowel or urinary habits, dysuria,hematuria, rash, arthralgias, visual complaints, headache, numbness weakness or ataxia.     Objective:   Physical Exam  Gen. Pleasant, well-nourished, in no distress ENT - no thrush, no pallor/icterus,no post nasal drip Neck: No JVD, no thyromegaly, no carotid bruits Lungs: no use of accessory muscles, no dullness to percussion, clear without rales or rhonchi  Cardiovascular: Rhythm regular, heart sounds  normal, no murmurs or gallops, no peripheral edema Musculoskeletal: No deformities, no cyanosis or clubbing        Assessment & Plan:

## 2021-10-27 NOTE — Patient Instructions (Addendum)
Get back on fasenra  X refills on Breo  Get [place] a copy of your sleep study. Based on this, we will decide need for repeat study Contact [facility] to decrease humidity setting

## 2021-10-28 ENCOUNTER — Other Ambulatory Visit: Payer: Self-pay | Admitting: Psychiatry

## 2021-10-28 ENCOUNTER — Telehealth: Payer: Self-pay | Admitting: "Endocrinology

## 2021-10-28 ENCOUNTER — Ambulatory Visit (INDEPENDENT_AMBULATORY_CARE_PROVIDER_SITE_OTHER): Payer: Medicare Other | Admitting: "Endocrinology

## 2021-10-28 ENCOUNTER — Encounter: Payer: Medicare Other | Attending: "Endocrinology | Admitting: Nutrition

## 2021-10-28 ENCOUNTER — Encounter: Payer: Self-pay | Admitting: "Endocrinology

## 2021-10-28 VITALS — BP 110/68 | HR 56 | Ht 64.0 in | Wt 208.6 lb

## 2021-10-28 DIAGNOSIS — I10 Essential (primary) hypertension: Secondary | ICD-10-CM | POA: Diagnosis present

## 2021-10-28 DIAGNOSIS — E1165 Type 2 diabetes mellitus with hyperglycemia: Secondary | ICD-10-CM | POA: Diagnosis present

## 2021-10-28 DIAGNOSIS — E782 Mixed hyperlipidemia: Secondary | ICD-10-CM | POA: Diagnosis present

## 2021-10-28 DIAGNOSIS — E039 Hypothyroidism, unspecified: Secondary | ICD-10-CM

## 2021-10-28 DIAGNOSIS — E669 Obesity, unspecified: Secondary | ICD-10-CM | POA: Diagnosis present

## 2021-10-28 LAB — POCT GLYCOSYLATED HEMOGLOBIN (HGB A1C): HbA1c, POC (controlled diabetic range): 7.8 % — AB (ref 0.0–7.0)

## 2021-10-28 MED ORDER — FREESTYLE LIBRE 3 SENSOR MISC: 1.0000 | 2 refills | Status: DC

## 2021-10-28 MED ORDER — TRULICITY 1.5 MG/0.5ML ~~LOC~~ SOAJ: SUBCUTANEOUS | 2 refills | Status: DC

## 2021-10-28 MED ORDER — LANTUS SOLOSTAR 100 UNIT/ML ~~LOC~~ SOPN: 40.0000 [IU] | PEN_INJECTOR | Freq: Every day | SUBCUTANEOUS | 1 refills | Status: DC

## 2021-10-28 NOTE — Telephone Encounter (Signed)
Fax # was to [facility]. Sent refill

## 2021-10-28 NOTE — Progress Notes (Signed)
Medical Nutrition Therapy  Appointment Start time:  [phone] Appointment End time: 5170 Follow up   NUTRITION ASSESSMENT  Follow up DM Type 2 Recently joined the [facility]. Went this am already. Libre results: 77% TIR, 23% TAR. Saw [name] today.  No longer taking vitamins but MVI  a day. A1C 7.8%   Anthropometrics  Wt Readings from Last 3 Encounters:  10/28/21 208 lb 9.6 oz (94.6 kg)  10/27/21 205 lb 6.4 oz (93.2 kg)  10/19/21 206 lb 9.6 oz (93.7 kg)   Ht Readings from Last 3 Encounters:  10/28/21 5\' 4"  (1.626 m)  10/27/21 5\' 4"  (1.626 m)  08/05/21 5\' 4"  (1.626 m)   There is no height or weight on file to calculate BMI. @BMIFA @ Facility age limit for growth percentiles is 20 years. Facility age limit for growth percentiles is 20 years.   FBS: 128 mg/dl.  Range 124-128 mg/dl..  Clinical Medical Hx: CHF and DM,  Medications: Lantus 60 units a day now, Trulicity weekly.  Labs:  Lab Results  Component Value Date   HGBA1C 7.8 (A) 10/28/2021   CMP Latest Ref Rng & Units 10/22/2021 07/15/2021 05/20/2021  Glucose 70 - 99 mg/dL 133(H) - 160(H)  BUN 6 - 20 mg/dL 40(H) 30(A) 20  Creatinine 0.44 - 1.00 mg/dL 0.79 0.9 0.72  Sodium 135 - 145 mmol/L 139 138 138  Potassium 3.5 - 5.1 mmol/L 3.7 4.2 4.0  Chloride 98 - 111 mmol/L 104 99 109  CO2 22 - 32 mmol/L 27 22 23   Calcium 8.9 - 10.3 mg/dL 9.0 9.5 9.0  Total Protein 6.5 - 8.1 g/dL 7.2 - 6.7  Total Bilirubin 0.3 - 1.2 mg/dL 0.6 - 0.6  Alkaline Phos 38 - 126 U/L 81 86 77  AST 15 - 41 U/L 14(L) 13 12(L)  ALT 0 - 44 U/L 15 14 13    Notable Signs/Symptoms:    Lifestyle & Dietary  Lives with her sister.   Estimated daily fluid intake:  64 oz   Sleep: varies  Stress / self-care: stress eater Current average weekly physical activity: ADL  B) Banana L) Leftover or lowcarb wraps with chicken salad. D) Hamburger with sweet potato, broccoli   Estimated Energy Needs Calories: 1200 Carbohydrate: 135g Protein: 90g Fat:  33g   NUTRITION DIAGNOSIS  NB-1.1 Food and nutrition-related knowledge deficit As related to Diabetes Type 2.  As evidenced by A1C 7.7%.   NUTRITION INTERVENTION  Nutrition education (E-1) on the following topics:  Lifestyle Medicine - Whole Food, Plant Predominant Nutrition is highly recommended: Eat Plenty of vegetables, Mushrooms, fruits, Legumes, Whole Grains, Nuts, seeds in lieu of processed meats, processed snacks/pastries red meat, poultry, eggs.    -It is better to avoid simple carbohydrates including: Cakes, Sweet Desserts, Ice Cream, Soda (diet and regular), Sweet Tea, Candies, Chips, Cookies, Store Bought Juices, Alcohol in Excess of  1-2 drinks a day, Lemonade,  Artificial Sweeteners, Doughnuts, Coffee Creamers, "Sugar-free" Products, etc, etc.  This is not a complete list.....  Exercise: If you are able: 30 -60 minutes a day ,4 days a week, or 150 minutes a week.  The longer the better.  Combine stretch, strength, and aerobic activities.  If you were told in the past that you have high risk for cardiovascular diseases, you may seek evaluation by your heart doctor prior to initiating moderate to intense exercise programs.  Handouts Provided Include  .Nutrition Lifestyle handout  Learning Style & Readiness for Change Teaching method utilized: Visual & Auditory  Demonstrated degree of understanding via: Teach Back  Barriers to learning/adherence to lifestyle change: none  Goals Established by Pt Work out 4 times per week. Eat three meals per day Continue to increase plant based foods Lose 2 lbs  per month Get A1C 7%      Drink a gallon of water  MONITORING & EVALUATION Dietary intake, weekly physical activity, and blood sugars in 3 month. Increase Lantus to 60 units at night per [name]..  Next Steps  Get back on track with meal planning and eating meals on time-more plant based foods.

## 2021-10-28 NOTE — Patient Instructions (Addendum)
Goals Established by Pt Work out 4 times per week. Eat three meals per day Continue to increase plant based foods Lose 2 lbs  per month Get A1C 7% Drink gallon of water

## 2021-10-28 NOTE — Telephone Encounter (Signed)
Called and left a voicemail asking for patient to return call to discuss.  °

## 2021-10-28 NOTE — Patient Instructions (Signed)

## 2021-10-28 NOTE — Telephone Encounter (Signed)
A VM was left and it stated " I am calling from a pharmacy " & this pt needs her [employer] faxed to [place] at [phone]. Thank you.

## 2021-10-28 NOTE — Progress Notes (Signed)
10/28/2021, 5:16 PM   Endocrinology follow-up note   Subjective:    Patient ID: Laura [name], female    DOB: 1961-07-06.  Sayaka Hoeppner was seen since 2019 for management of diabetes, also seen during her last visit in relation to her steroid exposure to rule out adrenal insufficiency.  PMD: [name], MD.   Past Medical History:  Diagnosis Date   Asthma    Atrial fibrillation ([place])    Bipolar disorder ([place])    Bronchiectasis ([place])    Carpal tunnel syndrome of right wrist    CHF (congestive heart failure) ([place])    Complication of anesthesia    "my Dr in [place] told me I had an allergic reaction to the combination of the pain meds and the anesthesia" she does not remember what happened but "I eneded up in the ICU for 5 days".   H/O congenital atrial septal defect (ASD) repair    Hypogammaglobulinemia ([place])    Hypothyroid    IgE deficiency (HCC)    Neuropathy    Neuropathy    Osteoarthritis    Pinched nerve in neck    PTSD (post-traumatic stress disorder)    Recurrent sinusitis    Short-term memory loss    Sleep apnea    TIA (transient ischemic attack)    Tremors of nervous system    seeing PCP for this.   Type 2 diabetes mellitus (HCC)    Urticaria    Wound infection    Past Surgical History:  Procedure Laterality Date   ASD REPAIR  1968   BIOPSY  01/26/2021   Procedure: BIOPSY;  Surgeon: [name], DO;  [facility];  Service: Endoscopy;;  gastric   CARDIAC SURGERY     CARDIOVERSION     CARDIOVERSION N/A 08/29/2018   Procedure: CARDIOVERSION;  Surgeon: Arnoldo Lenis, MD;  [facility];  Service: Endoscopy;  Laterality: N/A;   [place]   COLONOSCOPY WITH PROPOFOL N/A 01/26/2021   Procedure: COLONOSCOPY WITH PROPOFOL;  Surgeon: [name], DO;  [facility];  Service: Endoscopy;  Laterality: N/A;  am  appt, diabetic   ESOPHAGOGASTRODUODENOSCOPY (EGD) WITH PROPOFOL N/A 01/26/2021   Procedure: ESOPHAGOGASTRODUODENOSCOPY (EGD) WITH PROPOFOL;  Surgeon: [name], DO;  [facility];  Service: Endoscopy;  Laterality: N/A;   LEFT HEART CATH AND CORONARY ANGIOGRAPHY N/A 08/30/2019   Procedure: LEFT HEART CATH AND CORONARY ANGIOGRAPHY;  Surgeon: [name], MD;  Location: [facility];  Service: Cardiovascular;  Laterality: N/A;   NASAL SINUS SURGERY  2017   POLYPECTOMY  01/26/2021   Procedure: POLYPECTOMY;  Surgeon: [name], DO;  [facility];  Service: Endoscopy;;  colon   Social History   Socioeconomic History   Marital status: Widowed    Spouse name: Not on file   Number of children: Not on file   Years of education: Not on file   Highest education level: Not on file  Occupational History   Not on file  Tobacco Use   Smoking status: Former    Packs/day: 1.00  Years: 28.00    Pack years: 28.00    Types: Cigarettes    Start date: 36    Quit date: 2008    Years since quitting: 15.0   Smokeless tobacco: Never  Vaping Use   Vaping Use: Never used  Substance and Sexual Activity   Alcohol use: Not Currently   Drug use: Not Currently   Sexual activity: Not Currently    Birth control/protection: Post-menopausal  Other Topics Concern   Not on file  Social History Narrative   Not on file   Social Determinants of Health   Financial Resource Strain: Medium Risk   Difficulty of Paying Living Expenses: Somewhat hard  Food Insecurity: Food Insecurity Present   Worried About Charity fundraiser in the Last Year: Often true   Arboriculturist in the Last Year: Often true  Transportation Needs: No Transportation Needs   Lack of Transportation (Medical): No   Lack of Transportation (Non-Medical): No  Physical Activity: Insufficiently Active   Days of Exercise per Week: 3 days   Minutes of Exercise per Session: 20 min  Stress:  Stress Concern Present   Feeling of Stress : To some extent  Social Connections: Moderately Isolated   Frequency of Communication with Friends and Family: More than three times a week   Frequency of Social Gatherings with Friends and Family: More than three times a week   Attends Religious Services: 1 to 4 times per year   Active Member of [employer] or Organizations: No   Attends Archivist Meetings: Never   Marital Status: Widowed   Outpatient Encounter Medications as of 10/28/2021  Medication Sig   [DISCONTINUED] Continuous Blood Gluc Sensor (FREESTYLE LIBRE 3 SENSOR) MISC 1 Piece by Does not apply route every 14 (fourteen) days. Place 1 sensor on the skin every 14 days. Use to check glucose continuously   acetaminophen (TYLENOL) 325 MG tablet Take 2 tablets (650 mg total) by mouth every 6 (six) hours as needed for mild pain, fever or headache.   albuterol (PROVENTIL) (2.5 MG/3ML) 0.083% nebulizer solution Take 3 mLs (2.5 mg total) by nebulization every 4 (four) hours as needed for wheezing or shortness of breath.   albuterol (VENTOLIN HFA) 108 (90 Base) MCG/ACT inhaler INHALE 2 PUFFS INTO THE LUNGS EVERY 6 HOURS AS NEEDED FOR WHEEZING OR SHORTNESS OF BREATH   apixaban (ELIQUIS) 5 MG TABS tablet Take 1 tablet (5 mg total) by mouth 2 (two) times daily.   atorvastatin (LIPITOR) 10 MG tablet Take 10 mg by mouth every evening.    BD PEN NEEDLE NANO 2ND GEN 32G X 4 MM MISC 1 each by Other route in the morning, at noon, in the evening, and at bedtime.   bisoprolol (ZEBETA) 5 MG tablet Take 0.5 tablets (2.5 mg total) by mouth daily.   blood glucose meter kit and supplies 1 each by Other route 4 (four) times daily. Dispense based on patient and insurance preference. Use up to four times daily as directed. (FOR ICD-10 E10.9, E11.9).   Cholecalciferol (VITAMIN D3) 125 MCG (5000 UT) CAPS Take 1 capsule (5,000 Units total) by mouth daily.   diltiazem (CARDIZEM CD) 360 MG 24 hr capsule TAKE 1  CAPSULE(360 MG) BY MOUTH DAILY   divalproex (DEPAKOTE ER) 500 MG 24 hr tablet Take 1 tablet (500 mg total) by mouth daily AND 2 tablets (1,000 mg total) at bedtime.   Dulaglutide (TRULICITY) 1.5 ZC/5.8IF SOPN ADMINISTER 1.5 MG UNDER THE SKIN ONCE A  WEEK ON SUNDAY   ELDERBERRY PO Take 4 g by mouth daily. Gummies 2g each   EPINEPHrine 0.3 mg/0.3 mL IJ SOAJ injection Inject 0.3 mg into the muscle once as needed for anaphylaxis.   FASENRA PEN 30 MG/ML SOAJ INJECT $RemoveBef'30MG'[id number]$  SUBCUTANEOUSLY  EVERY 8 WEEKS (GIVEN AT MD  OFFICE)   ferrous sulfate 324 MG TBEC Take 324 mg by mouth daily with breakfast.   fluticasone furoate-vilanterol (BREO ELLIPTA) 100-25 MCG/ACT AEPB Inhale 1 puff into the lungs daily.   fluticasone furoate-vilanterol (BREO ELLIPTA) 100-25 MCG/INH AEPB Inhale 1 puff into the lungs daily.   gabapentin (NEURONTIN) 600 MG tablet Take 600 mg by mouth 3 (three) times daily.   gabapentin (NEURONTIN) 600 MG tablet Take 600 mg by mouth 3 (three) times daily.   hydrALAZINE (APRESOLINE) 25 MG tablet Take 25 mg by mouth 3 (three) times daily.   hydrOXYzine (ATARAX/VISTARIL) 25 MG tablet Take 1 tablet (25 mg total) by mouth every 6 (six) hours as needed for anxiety or nausea.   Immune Globulin, Human, 4 GM/20ML SOLN Inject 100 mLs into the skin every 14 (fourteen) days.   insulin glargine (LANTUS SOLOSTAR) 100 UNIT/ML Solostar Pen Inject 40 Units into the skin at bedtime.   Insulin Pen Needle (PEN NEEDLES 3/16") 31G X 5 MM MISC Use as directed with insulin pen   levothyroxine (SYNTHROID, LEVOTHROID) 112 MCG tablet Take 112 mcg by mouth daily before breakfast.   linaclotide (LINZESS) 145 MCG CAPS capsule Take 1 capsule (145 mcg total) by mouth daily before breakfast.   Melatonin 3 MG TABS Take 3 mg by mouth at bedtime.   metFORMIN (GLUCOPHAGE) 500 MG tablet Take 500 mg by mouth 2 (two) times daily.   metoCLOPramide (REGLAN) 5 MG tablet Take 5 mg by mouth 3 (three) times daily.   montelukast (SINGULAIR) 10  MG tablet Take 1 tablet by mouth daily.   Multiple Vitamins-Minerals (MULTIVITAMIN WITH MINERALS) tablet Take 1 tablet by mouth daily. Woman   Nebulizer MISC Nebulizer tubing kit   omeprazole (PRILOSEC) 20 MG capsule TAKE 1 CAPSULE(20 MG) BY MOUTH TWICE DAILY BEFORE A MEAL   ondansetron (ZOFRAN) 4 MG tablet Take 1 tablet (4 mg total) by mouth every 8 (eight) hours as needed for nausea or vomiting.   ONETOUCH ULTRA test strip 1 each by Other route in the morning, at noon, in the evening, and at bedtime.   polyethylene glycol (MIRALAX / GLYCOLAX) 17 g packet Take 17 g by mouth daily as needed.   Potassium Chloride ER 20 MEQ TBCR Take 20 mEq by mouth daily.   Tiotropium Bromide Monohydrate (SPIRIVA RESPIMAT) 1.25 MCG/ACT AERS Inhale 2 puffs into the lungs daily.   topiramate (TOPAMAX) 100 MG tablet Take 1 tablet (100 mg total) by mouth 2 (two) times daily.   torsemide (DEMADEX) 20 MG tablet Take 60 mg by mouth daily.   triamcinolone cream (KENALOG) 0.1 % Apply 1 application topically 2 (two) times daily.   vitamin B-12 (CYANOCOBALAMIN) 1000 MCG tablet Take 1,000 mcg by mouth daily.   vitamin C (ASCORBIC ACID) 500 MG tablet Take 500 mg by mouth daily. Power C immune support   [DISCONTINUED] Continuous Blood Gluc Receiver (FREESTYLE LIBRE 2 READER) DEVI As directed   [DISCONTINUED] Continuous Blood Gluc Sensor (FREESTYLE LIBRE 2 SENSOR) MISC 1 Piece by Does not apply route every 14 (fourteen) days.   [DISCONTINUED] Dulaglutide (TRULICITY) 1.5 KZ/9.9JT SOPN ADMINISTER 1.5 MG UNDER THE SKIN ONCE A WEEK ON SUNDAY   [DISCONTINUED] LANTUS  SOLOSTAR 100 UNIT/ML Solostar Pen INJECT 50 UNITS INTO THE SKIN ONCE DAILY (Patient taking differently: 40 Units.)   No facility-administered encounter medications on file as of 10/28/2021.    ALLERGIES: Allergies  Allergen Reactions   Ativan [Lorazepam] Hives   Phenergan [Promethazine Hcl] Hives    VACCINATION STATUS: Immunization History  Administered Date(s)  Administered   Influenza Split 08/24/2018   Influenza Whole 07/15/2019   Influenza,inj,Quad PF,6+ Mos 07/10/2017, 10/27/2021   Moderna Sars-Covid-2 Vaccination 12/31/2019, 01/30/2020   Pneumococcal-Unspecified 10/10/2012    Diabetes She presents for her follow-up diabetic visit. She has type 2 diabetes mellitus. Onset time: She was diagnosed at approximate age of 76 years, stated by gestational diabetes with all 3 of her pregnancies in her 70s. Her disease course has been improving (Since her last visit, she fell and injured herself.  CT scan of the head did not show any intracranial bleeding.Marland Kitchen). There are no hypoglycemic associated symptoms. Pertinent negatives for hypoglycemia include no confusion, headaches, pallor or seizures. Associated symptoms include fatigue. Pertinent negatives for diabetes include no blurred vision, no chest pain, no polydipsia, no polyphagia and no polyuria. There are no hypoglycemic complications. Symptoms are improving. Diabetic complications include a CVA. (She has history of open heart surgery for ASD in 1965.) Risk factors for coronary artery disease include dyslipidemia, diabetes mellitus, family history, obesity, hypertension, post-menopausal, sedentary lifestyle and tobacco exposure. Current diabetic treatments: She is currently on Lantus 45 units nightly, Humalog 7 units 3 times daily AC, Metformin 1000 g p.o. twice daily, Trulicity 1.5 mg subcutaneou 50 units nig. Her weight is stable. She is following a generally unhealthy diet. When asked about meal planning, she reported none. She has not had a previous visit with a dietitian. She rarely (She has medical problem with bronchiectasis for which she gets treated with steroids on and off.) participates in exercise. Her home blood glucose trend is decreasing steadily. Her breakfast blood glucose range is generally 140-180 mg/dl. Her lunch blood glucose range is generally 140-180 mg/dl. Her dinner blood glucose range is  generally 140-180 mg/dl. Her bedtime blood glucose range is generally 140-180 mg/dl. Her overall blood glucose range is 140-180 mg/dl. (Ms. Gamm presents with stable glycemic profile.  Point-of-care A1c of 7.8%.  Her CGM AGP shows average blood glucose of 156 for the last 14 days.  She has 77% TIR, 19% slightly above target.  No hypoglycemia.  ) An ACE inhibitor/angiotensin II receptor blocker is being taken.  Hyperlipidemia This is a chronic problem. The current episode started more than 1 year ago. Exacerbating diseases include diabetes and obesity. Associated symptoms include myalgias. Pertinent negatives include no chest pain or shortness of breath. Current antihyperlipidemic treatment includes statins. Risk factors for coronary artery disease include dyslipidemia, diabetes mellitus, family history, obesity, hypertension, a sedentary lifestyle and post-menopausal.  Hypertension This is a chronic problem. The current episode started more than 1 year ago. The problem is uncontrolled. Pertinent negatives include no blurred vision, chest pain, headaches, palpitations or shortness of breath. Risk factors for coronary artery disease include dyslipidemia, diabetes mellitus, obesity, sedentary lifestyle and smoking/tobacco exposure. Past treatments include ACE inhibitors. Hypertensive end-organ damage includes CVA.  Other The current episode started more than 1 month ago. The problem occurs constantly. Progression since onset: Patient reports that she did have exposure to steroids related to back pain on 2 separate occasions between November 2000 21 and 2022.  Last exposure was related to her COVID infection.  She reports that she developed fatigueced  that she developed fatigue. Associated symptoms include fatigue and myalgias. Pertinent negatives include no arthralgias, chest pain, chills, coughing, fever, headaches, nausea, rash or vomiting. She has tried nothing for the symptoms. The treatment provided no  relief.     Objective:    BP 110/68    Pulse (!) 56    Ht 5\' 4"  (1.626 m)    Wt 208 lb 9.6 oz (94.6 kg)    BMI 35.81 kg/m   Wt Readings from Last 3 Encounters:  10/28/21 208 lb 9.6 oz (94.6 kg)  10/27/21 205 lb 6.4 oz (93.2 kg)  10/19/21 206 lb 9.6 oz (93.7 kg)     Recent Results (from the past 2160 hour(s))  Urinalysis, Routine w reflex microscopic     Status: None   Collection Time: 10/19/21 10:52 AM  Result Value Ref Range   Specific Gravity, UA 1.015 1.005 - 1.030   pH, UA 5.5 5.0 - 7.5   Color, UA Yellow Yellow   Appearance Ur Clear Clear   Leukocytes,UA Negative Negative   Protein,UA Negative Negative/Trace   Glucose, UA Negative Negative   Ketones, UA Negative Negative   RBC, UA Negative Negative   Bilirubin, UA Negative Negative   Urobilinogen, Ur 0.2 0.2 - 1.0 mg/dL   Nitrite, UA Negative Negative   Microscopic Examination Comment     Comment: Microscopic follows if indicated.  BLADDER SCAN AMB NON-IMAGING     Status: None   Collection Time: 10/19/21 10:53 AM  Result Value Ref Range   Scan Result 12/17/21   TSH     Status: None   Collection Time: 10/22/21 12:09 PM  Result Value Ref Range   TSH 0.514 0.350 - 4.500 uIU/mL    Comment: Performed by a 3rd Generation assay with a functional sensitivity of <=0.01 uIU/mL. Performed at [facility], [address] [place] [place], [place]   T4, free     Status: Abnormal   Collection Time: 10/22/21 12:09 PM  Result Value Ref Range   Free T4 1.13 (H) 0.61 - 1.12 ng/dL    Comment: (NOTE) Biotin ingestion may interfere with free T4 tests. If the results are inconsistent with the TSH level, previous test results, or the clinical presentation, then consider biotin interference. If needed, order repeat testing after stopping biotin. Performed at [facility], [address], [place], [place]   Magnesium     Status: None   Collection Time: 10/22/21 12:09 PM  Result Value Ref Range   Magnesium 2.1 1.7 -  2.4 mg/dL    Comment: Performed at [facility], [address], [place], [place]  VITAMIN D 25 Hydroxy (Vit-D Deficiency, Fractures)     Status: None   Collection Time: 10/22/21 12:09 PM  Result Value Ref Range   Vit D, 25-Hydroxy 41.36 30 - 100 ng/mL    Comment: (NOTE) Vitamin D deficiency has been defined by the Institute of Medicine  and an Endocrine Society practice guideline as a level of serum 25-OH  vitamin D less than 20 ng/mL (1,2). The Endocrine Society went on to  further define vitamin D insufficiency as a level between 21 and 29  ng/mL (2).  1. IOM (Institute of Medicine). 2010. Dietary reference intakes for  calcium and D. [place]: The 2011. 2. Holick MF, Binkley [place], Bischoff-Ferrari HA, et al. Evaluation,  treatment, and prevention of vitamin D deficiency: an Endocrine  Society clinical practice guideline, JCEM. 2011 Jul; 96(7): 1911-30.  Performed at [facility], [address], [place], [place] [address]   Vitamin B12     Status: None   Collection Time: 10/22/21 12:09 PM  Result Value Ref Range   Vitamin B-12 640 180 - 914 pg/mL    Comment: (NOTE) This assay is not validated for testing neonatal or myeloproliferative syndrome specimens for Vitamin B12 levels. Performed at [facility], [address] [place], [place] [address]   IgG, IgA, IgM     Status: Abnormal   Collection Time: 10/22/21 12:09 PM  Result Value Ref Range   IgG (Immunoglobin G), Serum 1,227 586 - 1,602 mg/dL   IgA 42 (L) 87 - 564 mg/dL    Comment: Result confirmed on concentration.   IgM (Immunoglobulin M), Srm 73 26 - 217 mg/dL    Comment: (NOTE) Performed At: [facility] [address] [place], [place] [id number] Jolene Schimke MD IS:[id number]   CBC with Differential/Platelet     Status: None   Collection Time: 10/22/21 12:09 PM  Result Value Ref Range   WBC 4.9 4.0 - 10.5 K/uL   RBC 4.33 3.87 - 5.11 MIL/uL   Hemoglobin 14.4 12.0  - 15.0 g/dL   HCT 45.6 45.6 - 07.6 %   MCV 93.5 80.0 - 100.0 fL   MCH 33.3 26.0 - 34.0 pg   MCHC 35.6 30.0 - 36.0 g/dL   RDW 62.2 15.5 - 80.0 %   Platelets 155 150 - 400 K/uL   nRBC 0.0 0.0 - 0.2 %   Neutrophils Relative % 56 %   Neutro Abs 2.7 1.7 - 7.7 K/uL   Lymphocytes Relative 37 %   Lymphs Abs 1.8 0.7 - 4.0 K/uL   Monocytes Relative 7 %   Monocytes Absolute 0.3 0.1 - 1.0 K/uL   Eosinophils Relative 0 %   Eosinophils Absolute 0.0 0.0 - 0.5 K/uL   Basophils Relative 0 %   Basophils Absolute 0.0 0.0 - 0.1 K/uL   Immature Granulocytes 0 %   Abs Immature Granulocytes 0.02 0.00 - 0.07 K/uL    Comment: Performed at [facility], [address], [place], [place] [address]  Comprehensive metabolic panel     Status: Abnormal   Collection Time: 10/22/21 12:09 PM  Result Value Ref Range   Sodium 139 135 - 145 mmol/L   Potassium 3.7 3.5 - 5.1 mmol/L   Chloride 104 98 - 111 mmol/L   CO2 27 22 - 32 mmol/L   Glucose, Bld 133 (H) 70 - 99 mg/dL    Comment: Glucose reference range applies only to samples taken after fasting for at least 8 hours.   BUN 40 (H) 6 - 20 mg/dL   Creatinine, Ser 3.42 0.44 - 1.00 mg/dL   Calcium 9.0 8.9 - 29.4 mg/dL   Total Protein 7.2 6.5 - 8.1 g/dL   Albumin 4.1 3.5 - 5.0 g/dL   AST 14 (L) 15 - 41 U/L   ALT 15 0 - 44 U/L   Alkaline Phosphatase 81 38 - 126 U/L   Total Bilirubin 0.6 0.3 - 1.2 mg/dL   GFR, Estimated >59 >16 mL/min    Comment: (NOTE) Calculated using the CKD-EPI Creatinine Equation (2021)    Anion gap 8 5 - 15    Comment: Performed at [facility], [address] [place], [place] [address]  HgB A1c     Status: Abnormal   Collection Time: 10/28/21 10:57 AM  Result Value Ref Range   Hemoglobin A1C  HbA1c POC (<> result, manual entry)     HbA1c, POC (prediabetic range)     HbA1c, POC (controlled diabetic range) 7.8 (A) 0.0 - 7.0 %     Assessment & Plan:     1. DM type 2 causing vascular disease ([place])   Ms. Saindon presents with  stable glycemic profile.  Point-of-care A1c of 7.8%.  Her CGM AGP shows average blood glucose of 156 for the last 14 days.  She has 77% TIR, 19% slightly above target.  No hypoglycemia.  -her diabetes is complicated by CVA, history of open heart surgery for ASD, obesity/sedentary life, history of chronic smoking with bronchiectasis and Jordie Skalsky remains at a high risk for more acute and chronic complications which include CAD, CVA, CKD, retinopathy, and neuropathy. These are all discussed in detail with the patient.  - I have counseled her on diet management and weight loss, by adopting a carbohydrate restricted/protein rich diet.  - she acknowledges that there is a room for improvement in her food and drink choices. - Suggestion is made for her to avoid simple carbohydrates  from her diet including Cakes, Sweet Desserts, Ice Cream, Soda (diet and regular), Sweet Tea, Candies, Chips, Cookies, Store Bought Juices, Alcohol , Artificial Sweeteners,  Coffee Creamer, and "Sugar-free" Products, Lemonade. This will help patient to have more stable blood glucose profile and potentially avoid unintended weight gain.  The following Lifestyle Medicine recommendations according to [facility]) were discussed and and offered to patient and she  agrees to start the journey:  A. Whole Foods, Plant-Based Nutrition comprising of fruits and vegetables, plant-based proteins, whole-grain carbohydrates was discussed in detail with the patient.   A list for source of those nutrients were also provided to the patient.  Patient will use only water or unsweetened tea for hydration. B.  The need to stay away from risky substances including alcohol, smoking; obtaining 7 to 9 hours of restorative sleep, at least 150 minutes of moderate intensity exercise weekly, the importance of healthy social connections,  and stress management techniques were discussed.   - she is advised to stick to a  routine mealtimes to eat 3 meals  a day and avoid unnecessary snacks ( to snack only to correct hypoglycemia).   - she will be scheduled with Jearld Fenton, RDN, CDE for individualized diabetes education.  - I have approached her with the following individualized plan to manage diabetes and patient agrees:   -She is promising to continue to do better on her diet and exercise.   -In light of her presentation with recent stabilization of her glycemia towards target, she will not need any further intervention with prandial insulin.  She is advised to continue Lantus 40 units nightly,    associated with monitoring of blood glucose 4 times a day-daily before breakfast, lunch, supper, and at bedtime, and at any other time as needed using her CGM. - Patient is warned not to take insulin without proper monitoring per orders. -She would benefit from the [place] 3, discussed and prescribed for her.  -Patient is encouraged to call clinic for blood glucose levels less than 70 or above 200 mg /dl.  She is advised to continue Trulicity 1.5 mg subcutaneously weekly.   - Patient specific target  A1c;  LDL, HDL, Triglycerides,  were discussed in detail.  2.  BP/HTN: Her blood pressure is controlled to target.  She advised to continue her current blood pressure medications including hydralazine  25 mg 3 times daily.     3) Lipids/HPL: Her most recent lipid panel showed LDL of 82.  She is advised to continue atorvastatin 10 mg p.o. daily.  Side effects and precautions discussed with her   4)  Weight/Diet: Her BMI is 47.3-UYZJQDU complicating her diabetes care.  She is a candidate for modest weight loss.   The above discussed dietary plan will help achieve weight loss. -She is working with her dietitian.     5 hypothyroidism-long-standing, etiology unclear, she has multiple family members with thyroid dysfunctions. Her previsit thyroid function tests are consistent with appropriate replacement.  She is  advised to continue levothyroxine 112 mcg p.o. daily before breakfast.   - We discussed about the correct intake of her thyroid hormone, on empty stomach at fasting, with water, separated by at least 30 minutes from breakfast and other medications,  and separated by more than 4 hours from calcium, iron, multivitamins, acid reflux medications (PPIs). -Patient is made aware of the fact that thyroid hormone replacement is needed for life, dose to be adjusted by periodic monitoring of thyroid function tests.   6) vitamin D deficiency: - She is currently on  vitamin D supplements including vitamin D3 5000 units daily.     7) Chronic Care/Health Maintenance:  -she  is on  Statin medications and  is encouraged to continue to follow up with Ophthalmology, Dentist,  Podiatrist at least yearly or according to recommendations, and advised to  stay away from smoking. I have recommended yearly flu vaccine and pneumonia vaccination at least every 5 years;  and  sleep for at least 7 hours a day.  - I advised patient to maintain close follow up with [name], MD for primary care needs.   I spent 44 minutes in the care of the patient today including review of labs from [place], Lipids, Thyroid Function, Hematology (current and previous including abstractions from other facilities); face-to-face time discussing  her blood glucose readings/logs, discussing hypoglycemia and hyperglycemia episodes and symptoms, medications doses, her options of short and long term treatment based on the latest standards of care / guidelines;  discussion about incorporating lifestyle medicine;  and documenting the encounter.    Please refer to Patient Instructions for Blood Glucose Monitoring and Insulin/Medications Dosing Guide"  in media tab for additional information. Please  also refer to " Patient Self Inventory" in the Media  tab for reviewed elements of pertinent patient history.  Kadynce Bonds participated in the  discussions, expressed understanding, and voiced agreement with the above plans.  All questions were answered to her satisfaction. she is encouraged to contact clinic should she have any questions or concerns prior to her return visit.   Follow up plan: - Return in about 4 months (around 02/25/2022) for Bring Meter and Logs- A1c in Office.  Glade Lloyd, MD [facility] [address] [place], [place] [address] Phone: [phone]  Fax: [phone]    10/28/2021, 5:16 PM  This note was partially dictated with voice recognition software. Similar sounding words can be transcribed inadequately or may not  be corrected upon review.

## 2021-10-29 ENCOUNTER — Encounter: Payer: Self-pay | Admitting: *Deleted

## 2021-10-29 NOTE — Telephone Encounter (Signed)
Attempted to call patient, there was no answer. MyChart message has been sent to patient.

## 2021-11-01 ENCOUNTER — Other Ambulatory Visit (HOSPITAL_COMMUNITY): Payer: Self-pay | Admitting: Neurosurgery

## 2021-11-01 ENCOUNTER — Other Ambulatory Visit: Payer: Self-pay | Admitting: Neurosurgery

## 2021-11-01 DIAGNOSIS — M4802 Spinal stenosis, cervical region: Secondary | ICD-10-CM

## 2021-11-02 ENCOUNTER — Telehealth: Payer: Self-pay | Admitting: "Endocrinology

## 2021-11-02 NOTE — Telephone Encounter (Signed)
Patient said that her insurance will not cover the [place] 3. She is currently wearing the [place] 2. Please Advise what you would like the patient to do.  [phone]

## 2021-11-03 NOTE — Telephone Encounter (Signed)
Pt states she contacted her insurance agent yesterday which contacted "[employer]" company and was able to get pt's llibre 3 approved.

## 2021-11-03 NOTE — Telephone Encounter (Signed)
Left a message requesting pt return call to the office. ?

## 2021-11-03 NOTE — Telephone Encounter (Signed)
Pt left a voicemail returning your call

## 2021-11-09 ENCOUNTER — Encounter: Payer: Self-pay | Admitting: Nutrition

## 2021-11-10 ENCOUNTER — Telehealth: Payer: Self-pay

## 2021-11-10 NOTE — Telephone Encounter (Signed)
I think this might be more of a Tammy issue. I am not sure what is covered. Routing to her.   Salvatore Marvel, MD [facility]

## 2021-11-10 NOTE — Telephone Encounter (Signed)
Patient called and stated that she needed to speak to Dr. Ernst Bowler to talk about her infusions as her insurance has changed and expressed that her medications would change. Patient expressed that she wants to speak with Dr. Ernst Bowler to see what the best decision would be on her medication change. Please advise.

## 2021-11-11 NOTE — Telephone Encounter (Signed)
L/m for patient to contact me regarding Ins change and SCIG

## 2021-11-15 NOTE — Telephone Encounter (Signed)
Called patient and explained that [facility] that is currently providing her cuvitru is not in network with [facility] and will need to change her over to [facility] and will send new Rx to same

## 2021-11-15 NOTE — Telephone Encounter (Signed)
L/m for patient to contact me  

## 2021-11-17 ENCOUNTER — Telehealth: Payer: Self-pay | Admitting: Urology

## 2021-11-17 NOTE — Telephone Encounter (Signed)
Inbound call from the pt concerned that when she was in to see Dr. Diona Fanti on 10/19/21, she was told that someone would be in touch w/her regarding the potential implant in her ankle but she has not heard anything from the nurse, her insurance or the office .  Tammy states she didn't even receive info on the over-active bladder that she was supposed to get while @ check-out or even her AWV. The patient is requesting a call back to discuss further action & status on the form that was supposed to be submitted for insurance to cover the implant/process.  Thank you

## 2021-11-18 ENCOUNTER — Other Ambulatory Visit: Payer: Self-pay

## 2021-11-18 ENCOUNTER — Telehealth: Payer: Self-pay | Admitting: Pulmonary Disease

## 2021-11-18 DIAGNOSIS — G4733 Obstructive sleep apnea (adult) (pediatric): Secondary | ICD-10-CM

## 2021-11-18 LAB — HEPATIC FUNCTION PANEL
ALT: 12 (ref 7–35)
AST: 13 (ref 13–35)
Alkaline Phosphatase: 96 (ref 25–125)
Bilirubin, Total: 0.3

## 2021-11-18 LAB — COMPREHENSIVE METABOLIC PANEL
Albumin: 4.6 (ref 3.5–5.0)
Calcium: 9.6 (ref 8.7–10.7)
Globulin: 2.5

## 2021-11-18 LAB — BASIC METABOLIC PANEL
BUN: 20 (ref 4–21)
CO2: 21 (ref 13–22)
Chloride: 104 (ref 99–108)
Creatinine: 0.7 (ref 0.5–1.1)
Glucose: 124
Potassium: 4.4 (ref 3.4–5.3)
Sodium: 143 (ref 137–147)

## 2021-11-18 LAB — TSH: TSH: 1.59 (ref 0.41–5.90)

## 2021-11-18 LAB — LIPID PANEL
Cholesterol: 142 (ref 0–200)
HDL: 46 (ref 35–70)
LDL Cholesterol: 80
Triglycerides: 85 (ref 40–160)

## 2021-11-18 LAB — HEMOGLOBIN A1C: Hemoglobin A1C: 7.5

## 2021-11-18 NOTE — Telephone Encounter (Signed)
HST 03/2010 >> AHI 14/h  She needs repeat HST please

## 2021-11-18 NOTE — Telephone Encounter (Signed)
Called and left detailed message for pt on VM (ok per DPR) letting her know we are placing an order for a repeat HST. Advised her to call back for questions and informed her on VM HSTs are currently scheduling 8-10 weeks out. Nothing further needed.

## 2021-11-19 NOTE — Telephone Encounter (Signed)
No precert needed for PTNS.   NV appointments scheduled.

## 2021-11-22 ENCOUNTER — Ambulatory Visit (INDEPENDENT_AMBULATORY_CARE_PROVIDER_SITE_OTHER): Payer: Medicare Other | Admitting: Clinical

## 2021-11-22 ENCOUNTER — Other Ambulatory Visit: Payer: Self-pay

## 2021-11-22 ENCOUNTER — Ambulatory Visit (INDEPENDENT_AMBULATORY_CARE_PROVIDER_SITE_OTHER): Payer: Medicare Other

## 2021-11-22 VITALS — BP 143/82 | HR 72

## 2021-11-22 DIAGNOSIS — F4310 Post-traumatic stress disorder, unspecified: Secondary | ICD-10-CM | POA: Diagnosis not present

## 2021-11-22 DIAGNOSIS — R32 Unspecified urinary incontinence: Secondary | ICD-10-CM | POA: Diagnosis not present

## 2021-11-22 NOTE — Progress Notes (Signed)
PTNS  Session # 1 of 12  Health & Social Factors: None Caffeine: Rarely Alcohol: None Daytime voids #per day: 10-20 Night-time voids #per night: 2-3 Urgency: Yes Incontinence Episodes #per day: Varies Ankle used: Left Treatment Setting: 6 Feeling/ Response: Feels effects Comments: Estill Bamberg went over procedure with pt.  Performed By: Janett Billow CMA  Follow Up: PTNS appt next week

## 2021-11-22 NOTE — Progress Notes (Signed)
Virtual Visit via Video Note   I connected with Laura [name] on 11/22/21 at  9:00 AM EST by a video enabled telemedicine application and verified that I am speaking with the correct person using two identifiers.   Location: Patient: Home Provider: Office   I discussed the limitations of evaluation and management by telemedicine and the availability of in person appointments. The patient expressed understanding and agreed to proceed.   THERAPIST PROGRESS NOTE   Session Time: 9:00 AM-:9:45 AM   Participation Level: Active   Behavioral Response: CasualAlertDepressed   Type of Therapy: Individual Therapy   Treatment Goals addressed: Coping   Interventions: CBT, DBT, Solution Focused, Strength-based and Supportive   Summary: Laura [name] is a 61 y.o. female who presents with PTSD. The OPT therapist worked with the patient for her ongoing OPT treatment session. The OPT therapist utilized Motivational Interviewing to assist in creating therapeutic repore. The patient in the session was engaged and work in collaboration giving feedback about her triggers and symptoms over the past few weeks. The patient spoke about meeting with her surgeon this week to have a foot surgery. The patient spoke about getting back into her exercise routine 4x a week currently. The OPT therapist utilized Cognitive Behavioral Therapy through cognitive restructuring as well as worked with the patient on coping strategies to assist in management of mental health symptoms. The patient reviewed her ongoing work on her physical health. The patient spoke about her experience in getting re-acclimated to Laura [name]. The OPT therapist placed emphasis on the patient keeping all upcoming health appointments and adhering to ongoing health treatment recommendations.    Suicidal/Homicidal: Nowithout intent/plan   Therapist Response:The OPT therapist worked with the patient for the patients scheduled session. The patient was engaged in  her session and gave feedback in relation to triggers, symptoms, and behavior responses over the past few weeks. The OPT therapist worked with the patient utilizing an in session Cognitive Behavioral Therapy exercise. The patient was responsive in the session and verbalized, " My A1C has went down so that's good and I have been focusing on my health and doing a lot of appointments , I met with my new neurologist, today I am going to be going to my PCP at 11AM and then later to see my urologist and we are going to due a process where they put a needle into my ankle bone which is suppose to send a signal to my bladder its some kind of new technology, then Wednesday I go to my asthma doctor". The patient noted that she is now assigned a nurse with her PCP as part of her new health insurance requirements. The patient spoke about currently being under the weather since after church, the patient noted she may have a stomach bug. The patient spoke about the impact of Valentines Day being single.The patient spoke about her upcoming birthday turning 23. The patient spoke about her interactions with her daughters and this has been a positive for the patient and a protective factor.The patient has been working to cultivate a relationship with her middle daughter Laura [name] to allow her to have more interaction with her grandchildren.The OPT therapist will continue treatment work with the patient in her next scheduled session.   Plan: Return again in 2/3 weeks.   Diagnosis:      Axis I:Post Traumatic Stress Disorder  Axis II: No diagnosis   I discussed the assessment and treatment plan with the patient. The patient was provided an opportunity to ask questions and all were answered. The patient agreed with the plan and demonstrated an understanding of the instructions.   The patient was advised to call back or seek an in-person evaluation if the symptoms worsen or if the condition fails to improve  as anticipated.   I provided 45 minutes of non-face-to-face time during this encounter.   Laura Hides, LCSW   11/22/2021

## 2021-11-24 ENCOUNTER — Ambulatory Visit (INDEPENDENT_AMBULATORY_CARE_PROVIDER_SITE_OTHER): Payer: Medicare Other | Admitting: Allergy & Immunology

## 2021-11-24 ENCOUNTER — Other Ambulatory Visit: Payer: Self-pay

## 2021-11-24 ENCOUNTER — Encounter: Payer: Self-pay | Admitting: Allergy & Immunology

## 2021-11-24 VITALS — BP 114/72 | HR 64 | Resp 17

## 2021-11-24 DIAGNOSIS — J4550 Severe persistent asthma, uncomplicated: Secondary | ICD-10-CM

## 2021-11-24 DIAGNOSIS — D839 Common variable immunodeficiency, unspecified: Secondary | ICD-10-CM | POA: Diagnosis not present

## 2021-11-24 DIAGNOSIS — J310 Chronic rhinitis: Secondary | ICD-10-CM | POA: Diagnosis not present

## 2021-11-24 NOTE — Progress Notes (Signed)
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 Elsworth Soho)    Plan/Recommendations:   1. Hypogammaglobulinemia - Continue with Cuvitru.  - We will continue with 25 g every 2 weeks. - IgG level was excellent and the rest of your labs looked good as well (aside from the hemoglobin A1c which is IMPROVING).   2. Chronic rhinitis   - Continue with nasal saline rinses as needed.  - You seem to have a good sense of your symptoms.  3. Severe persistent asthma, uncomplicated - Lung function is COMPLETELY NORMAL.  - We are going to get that [place] back on board.  - We are going to continue with the same medication regimen for now.  - Daily controller medication(s): Breo 100/1mcg one puff once daily + Fasenra every 8 weeks - Prior to physical activity: ProAir 2 puffs 10-15 minutes before physical activity. - Rescue medications: ProAir 4 puffs every 4-6 hours as needed, albuterol nebulizer one vial every 4-6 hours as needed or DuoNeb nebulizer one vial every 4-6 hours as needed - Asthma control goals:  * Full participation in all desired activities (may need albuterol before activity) * Albuterol use two time or less a week on average (not counting use with activity) * Cough interfering with sleep two time or less a month * Oral steroids no more than once a year * No hospitalizations.  4. Return in about 4 months (around 03/24/2022).    Subjective:   Laura [name] is a 61 y.o. female presenting today for follow up of  Chief Complaint  Patient presents with   Asthma    Laura [name] has a history  of the following: Patient Active Problem List   Diagnosis Date Noted   Vitamin B12 deficiency 05/21/2021   Vitamin D deficiency 05/21/2021   Abdominal pain 04/27/2021   Urinary incontinence 04/27/2021   Dysphagia 04/27/2021   Constipation 04/27/2021   Mixed 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 ([place]) 04/10/2021   GERD without esophagitis 04/10/2021   Adnexal cyst 01/19/2021   Chronic RLQ pain 01/19/2021   Mixed stress and urge urinary incontinence 01/19/2021   Other fatigue 12/10/2020   Abdominal pain, chronic, right lower quadrant 12/08/2020   Abnormal weight loss 12/08/2020   Poor appetite 12/08/2020   Abnormal EKG    Chronic diastolic (congestive) heart failure ([place]) 08/29/2019   Shortness of breath 08/29/2019   Musculoskeletal chest pain 08/28/2019   Headache 08/10/2018   H/O atrial flutter 08/10/2018   OSA on CPAP 08/10/2018   Anxiety 08/10/2018   Uncontrolled type 2 diabetes mellitus with hyperglycemia ([place]) 03/13/2018   Hypothyroidism 03/13/2018   Essential hypertension, benign 03/13/2018   Mixed hyperlipidemia 03/13/2018   Hypogammaglobulinemia ([place]) 02/06/2018   Non-allergic rhinitis 02/06/2018   Severe persistent asthma, uncomplicated 63/84/5364   Pulmonary nodules 02/06/2018   Bronchiectasis without complication ([place]) 68/12/2120   Type 2 diabetes mellitus with hyperglycemia, with long-term current use of insulin ([place]) 02/06/2018    History obtained from: chart review and patient.  Laura [name] is a 61 y.o. female presenting for a follow up visit.  We last saw  her during December 2022.  At that time, she had a televisit due to a sinus infection that she was experiencing.  At the time, she was out of town in [place] and needed antibiotics.  We sent those in.  Specifically, we sent in Augmentin.  She was up-to-date on her immunoglobulin infusions.  Since last visit, she is largely doing well.  She did come back from [place] after being out there  for 6 to 8 weeks.  She was very happy to be back.  Evidently, the experience of there was fairly dreadful for her.  She fell when she was in prison.  Asthma/Respiratory Symptom History: She remains on the Breo one puff once daily. This is working well to control her breathing. She has not been to the ED and she has not needed to use her rescue inhaler. Her symptoms have largely been under good control.  She continues to follow with pulmonology. We have her on Fasenra, which she is a ministering to her self at home. She remains on her control her medications without adverse event. Thankfully, she has not had any of the prolonged shortness of breath episodes that she was experiencing last summer,  and since all of that has resolved, she has been very good from a pulmonary perspective. She is working on losing weight again. She is exercising at the [facility].   Allergic Rhinitis Symptom History: She remains on nasal saline rinses.  She seemed to have a good sense of her symptoms.  She does not use any nose sprays on a regular basis.  Laura [name] is doing well and her immunoglobulin infusions. We did send an antibiotic course when she was visiting family out in [place] at one point in the last couple of months, but otherwise she has not needed any antibiotics at all. Her infusions are going well. Evidently, there are some issues with insurance coverage that we are working through with the help of our Systems analyst. Hopefully this will be resolved soon. Laura [name] tells me that her insurance company wants her to go to an infusion center instead of getting the infusions at our home, which she has been doing without adverse event for the past two years since I have seen her. I don't see any reason why we should have to go to an infusion center.   She is doing 25 grams every 2 weeks. She is doing well with her infusions without any breakthrough infections. She is happy to be back in [place] after 8 weeks in [place].  She is back to living with her sister. She recently joined [employer] and is working on getting all of that information for them.   She is going to see the surgeon for her foot. She is going to have surgery for this soon. Her hemoglobin A1c has been all over the place.   Otherwise, there have been no changes to her past medical history, surgical history, family history, or social history.    Review of Systems  Constitutional: Negative.  Negative for chills, fever, malaise/fatigue and weight loss.  HENT:  Negative for congestion, ear discharge, ear pain and sinus pain.   Eyes:  Negative for pain, discharge and redness.  Respiratory:  Negative for cough, sputum production, shortness of breath and wheezing.   Cardiovascular: Negative.  Negative for chest pain and palpitations.  Gastrointestinal:  Negative for abdominal pain, constipation, diarrhea, heartburn, nausea and vomiting.  Skin: Negative.  Negative for itching and rash.  Neurological:  Negative for dizziness  and headaches.  Endo/Heme/Allergies:  Negative for environmental allergies. Does not bruise/bleed easily.      Objective:   Blood pressure 114/72, pulse 64, resp. rate 17, SpO2 97 %. There is no height or weight on file to calculate BMI.    Physical Exam Vitals reviewed.  Constitutional:      Appearance: She is well-developed and overweight.  HENT:     Head: Normocephalic and atraumatic.     Right Ear: Tympanic membrane, ear canal and external ear normal.     Left Ear: Tympanic membrane, ear canal and external ear normal.     Nose: No nasal deformity, septal deviation, mucosal edema or rhinorrhea.     Right Turbinates: Enlarged and swollen.     Left Turbinates: Enlarged and swollen.     Right Sinus: No maxillary sinus tenderness or frontal sinus tenderness.     Left Sinus: No maxillary sinus tenderness or frontal sinus tenderness.     Mouth/Throat:     Mouth: Mucous membranes are not pale and not dry.     Pharynx:  Uvula midline.  Eyes:     General: Lids are normal. No allergic shiner.       Right eye: No discharge.        Left eye: No discharge.     Conjunctiva/sclera: Conjunctivae normal.     Right eye: Right conjunctiva is not injected. No chemosis.    Left eye: Left conjunctiva is not injected. No chemosis.    Pupils: Pupils are equal, round, and reactive to light.  Cardiovascular:     Rate and Rhythm: Normal rate and regular rhythm.     Heart sounds: Normal heart sounds.  Pulmonary:     Effort: Pulmonary effort is normal. No tachypnea, accessory muscle usage or respiratory distress.     Breath sounds: Normal breath sounds. No wheezing, rhonchi or rales.     Comments: Moving air well in all lung fields. No increased work of breathing noted.  Chest:     Chest wall: No tenderness.  Lymphadenopathy:     Cervical: No cervical adenopathy.  Skin:    Coloration: Skin is not pale.     Findings: No abrasion, erythema, petechiae or rash. Rash is not papular, urticarial or vesicular.  Neurological:     Mental Status: She is alert.  Psychiatric:        Behavior: Behavior is cooperative.     Diagnostic studies:    Spirometry: results normal (FEV1: 1.95/78%, FVC: 2.59/82%, FEV1/FVC: 75%).    Spirometry consistent with normal pattern.   Allergy Studies: none        Salvatore Marvel, MD  [facility]

## 2021-11-24 NOTE — Patient Instructions (Addendum)
1. Hypogammaglobulinemia - Continue with Cuvitru.  - We will continue with 25 g every 2 weeks. - IgG level was excellent and the rest of your labs looked good as well (aside from the hemoglobin A1c which is IMPROVING).   2. Chronic rhinitis   - Continue with nasal saline rinses as needed.  - You seem to have a good sense of your symptoms.  3. Severe persistent asthma, uncomplicated - Lung function is COMPLETELY NORMAL.  - We are going to get that [place] back on board.  - We are going to continue with the same medication regimen for now.  - Daily controller medication(s): Breo 100/41mcg one puff once daily + Fasenra every 8 weeks - Prior to physical activity: ProAir 2 puffs 10-15 minutes before physical activity. - Rescue medications: ProAir 4 puffs every 4-6 hours as needed, albuterol nebulizer one vial every 4-6 hours as needed or DuoNeb nebulizer one vial every 4-6 hours as needed - Asthma control goals:  * Full participation in all desired activities (may need albuterol before activity) * Albuterol use two time or less a week on average (not counting use with activity) * Cough interfering with sleep two time or less a month * Oral steroids no more than once a year * No hospitalizations.  4. Return in about 4 months (around 03/24/2022).    Please inform us of any Emergency Department visits, hospitalizations, or changes in symptoms. Call us before going to the ED for breathing or allergy symptoms since we might be able to fit you in for a sick visit. Feel free to contact us anytime with any questions, problems, or concerns.  It was a pleasure to see you again today!  Websites that have reliable patient information: 1. American Academy of Asthma, Allergy, and Immunology: www.aaaai.org 2. Food Allergy Research and Education (FARE): foodallergy.org 3. Mothers of Asthmatics: http://www.asthmacommunitynetwork.org 4. American College of Allergy, Asthma, and Immunology:  www.acaai.org   COVID-19 Vaccine Information can be found at: ShippingScam.co.uk For questions related to vaccine distribution or appointments, please email vaccine@[email or web address] .[email or web address] or call [phone].     Like [place] on [place] and Instagram for our latest updates!       Make sure you are registered to vote! If you have moved or changed any of your contact information, you will need to get this updated before voting!  In some cases, you MAY be able to register to vote online: [email or web address]

## 2021-11-25 ENCOUNTER — Ambulatory Visit (INDEPENDENT_AMBULATORY_CARE_PROVIDER_SITE_OTHER): Payer: Medicare Other | Admitting: Podiatry

## 2021-11-25 ENCOUNTER — Encounter: Payer: Self-pay | Admitting: Podiatry

## 2021-11-25 DIAGNOSIS — M2041 Other hammer toe(s) (acquired), right foot: Secondary | ICD-10-CM | POA: Diagnosis not present

## 2021-11-25 DIAGNOSIS — M2011 Hallux valgus (acquired), right foot: Secondary | ICD-10-CM

## 2021-11-25 DIAGNOSIS — M778 Other enthesopathies, not elsewhere classified: Secondary | ICD-10-CM

## 2021-11-25 DIAGNOSIS — S99921D Unspecified injury of right foot, subsequent encounter: Secondary | ICD-10-CM | POA: Diagnosis not present

## 2021-11-25 DIAGNOSIS — S90221A Contusion of right lesser toe(s) with damage to nail, initial encounter: Secondary | ICD-10-CM | POA: Diagnosis not present

## 2021-11-25 MED ORDER — TRIAMCINOLONE ACETONIDE 40 MG/ML IJ SUSP
20.0000 mg | Freq: Once | INTRAMUSCULAR | Status: AC
  Administered 2021-11-25: 20 mg

## 2021-11-27 NOTE — Progress Notes (Signed)
She presents today for follow-up of her MRI and states that her foot seems to be getting worse as she refers to the second metatarsophalangeal joint area.  She states that she would like to have an injection today if possible.  And she would like to have this surgically corrected as soon as possible.  She does relate that she is on blood thinner.  Objective: Vital signs are stable alert oriented x3 have reviewed her past medical history medications allergies surgery social history and review of systems.  Pulses are strongly palpable right foot.  There is mild edema no erythema cellulitis drainage or odor.  She does have tenderness on end range of motion of the second metatarsophalangeal joint with medial deviation of the second toe and mild elevation of the second toe at the level of the joint.  She also has tenderness on palpation of her mild bunion deformity.  She has an increase in the first intermetatarsal angle of 12 degrees hallux abductus angle of about 10 degrees Biopar tibial sesamoid found on MRI and a partial tear of the plantar plate of the first metatarsal phalangeal found on MRI.  Otherwise no open lesions or wounds are noted.  Traumatic injury hallux right partial nail onycholysis.  Assessment: Capsulitis of the first and second metatarsophalangeal joints hallux valgus deformities right.  Plantarflexed elongated second metatarsal resulting in hammertoe deformity medial deviation with capsulitis.  Nail dystrophy with subungual onycholysis  Plan: Discussed etiology pathology and surgical therapies at this point I injected around the joint with 5 mg of Kenalog and local anesthetic.  She tolerated procedure well.  We will consent her today for an [facility] bunion repair second metatarsal osteotomy hammertoe repair with pin.  We did discuss the possible postop complications which may include but not limited to postop pain bleeding swell infection recurrence need further surgery overcorrection under  correction loss of digit loss of limb loss of life.  Provided her with information regarding the surgery center a cam boot and information regarding anesthesia group.  She understands we need medical clearance and clearance for discontinuation of the blood thinner from her primary care provider  I debrided the loose nail for her today.

## 2021-11-29 ENCOUNTER — Other Ambulatory Visit: Payer: Self-pay

## 2021-11-29 ENCOUNTER — Ambulatory Visit: Payer: Medicare Other

## 2021-11-29 ENCOUNTER — Ambulatory Visit (HOSPITAL_COMMUNITY)
Admission: RE | Admit: 2021-11-29 | Discharge: 2021-11-29 | Disposition: A | Discharge status: Home / Self Care | Payer: Medicare Other | Source: Ambulatory Visit | Attending: Neurosurgery | Admitting: Neurosurgery

## 2021-11-29 ENCOUNTER — Encounter: Payer: Self-pay | Admitting: Podiatry

## 2021-11-29 ENCOUNTER — Telehealth: Payer: Self-pay

## 2021-11-29 ENCOUNTER — Telehealth: Payer: Self-pay | Admitting: Cardiology

## 2021-11-29 DIAGNOSIS — M4802 Spinal stenosis, cervical region: Secondary | ICD-10-CM | POA: Insufficient documentation

## 2021-11-29 IMAGING — MR MR CERVICAL SPINE W/O CM
5 series · 37 of 48 positions shown · non-contrast
Comparison: [DATE].

CLINICAL DATA: Cervical stenosis of spinal canal [44] ([44]-CM)

EXAM:
MRI CERVICAL SPINE WITHOUT CONTRAST
TECHNIQUE: Multiplanar, multisequence MR imaging of the cervical spine was
performed. No intravenous contrast was administered.

[Series 5: T2 · sagittal · 3.0mm · 0.69mm/px · 6 of 15 slices shown (1 of 2)]
[im 1/15]
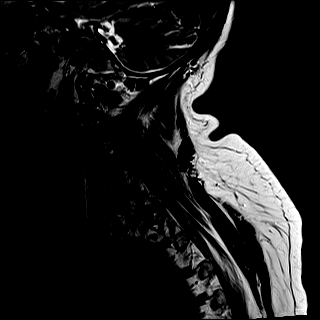
[im 3/15]
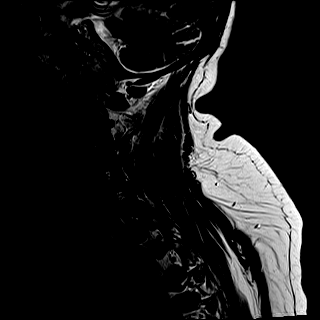
[im 6/15]
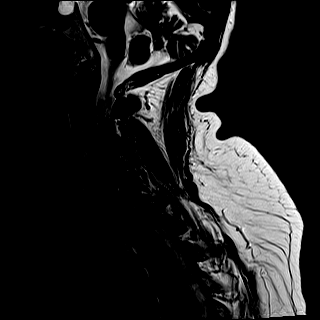
[im 9/15]
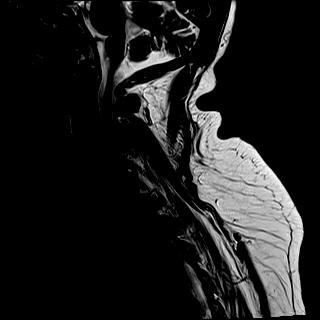
[im 12/15]
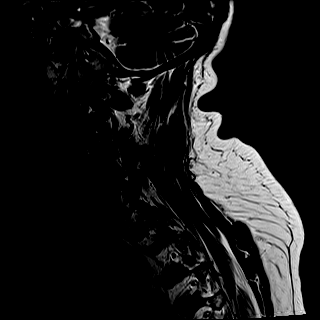
[im 15/15]
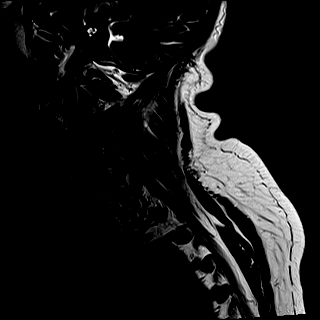

[Series 6: T1 · sagittal · 3.0mm · 0.86mm/px · 6 of 15 slices shown]
[im 1/15]
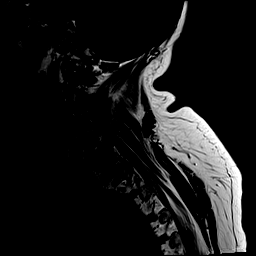
[im 3/15]
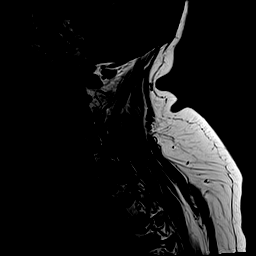
[im 6/15]
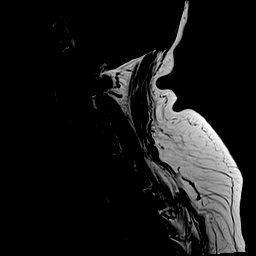
[im 9/15]
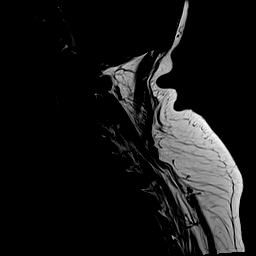
[im 12/15]
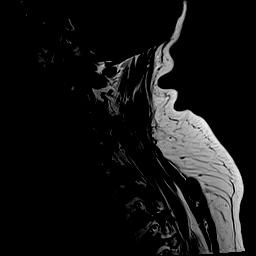
[im 15/15]
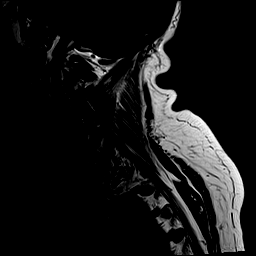

[Series 7: STIR · sagittal · 3.0mm · 0.69mm/px · 6 of 15 slices shown]
[im 1/15]
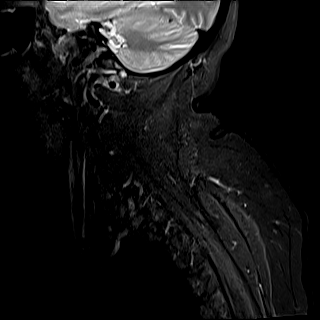
[im 3/15]
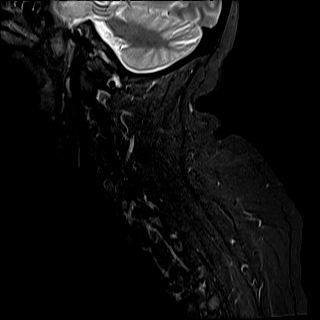
[im 6/15]
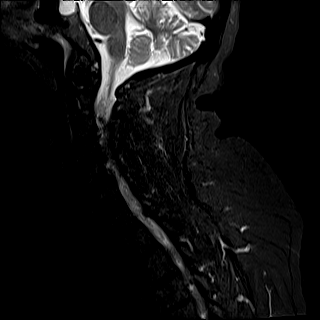
[im 9/15]
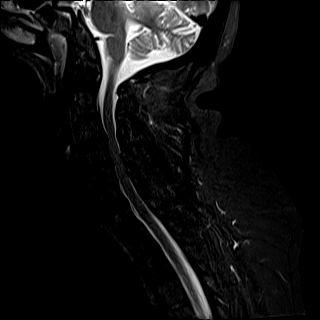
[im 12/15]
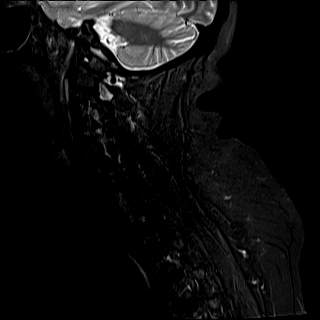
[im 15/15]
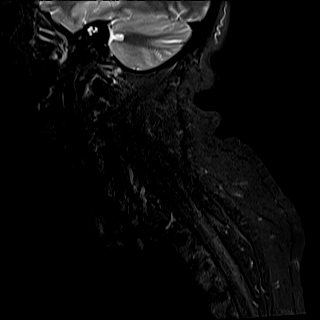

[Series 8: T2 · axial · 3.0mm · 0.70mm/px · z∈[-133,-22]mm · 11 of 35 slices shown (2 of 2)]
[im 1/35]
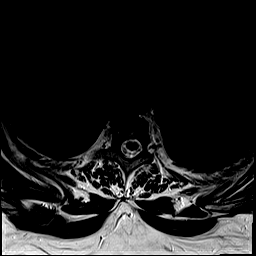
[im 3/35]
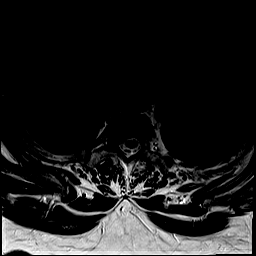
[im 5/35]
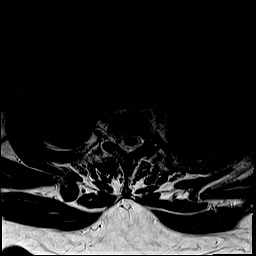
[im 8/35]
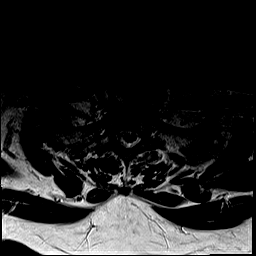
[im 10/35]
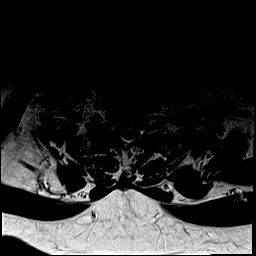
[im 15/35]
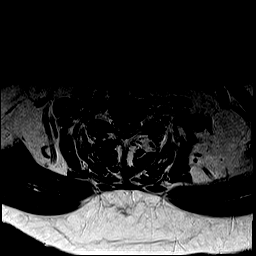
[im 18/35]
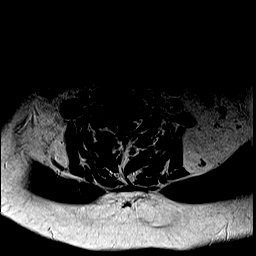
[im 20/35]
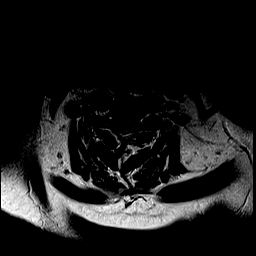
[im 25/35]
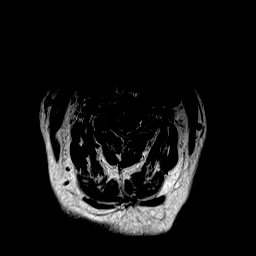
[im 30/35]
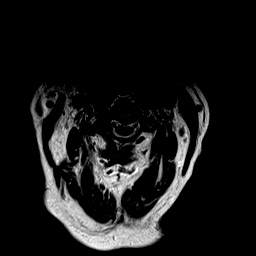
[im 35/35]
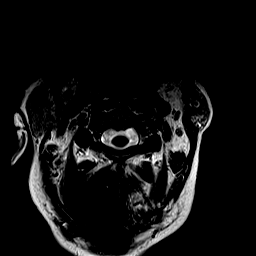

[Series 9: GRE · axial · 3.0mm · 0.35mm/px · z∈[-133,-22]mm · 8 of 35 slices shown]
[im 1/35]
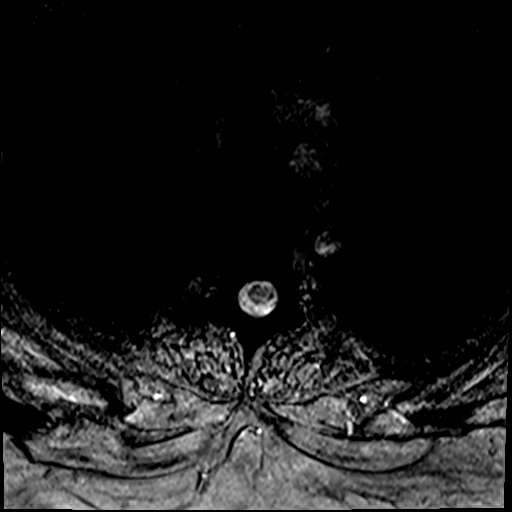
[im 5/35]
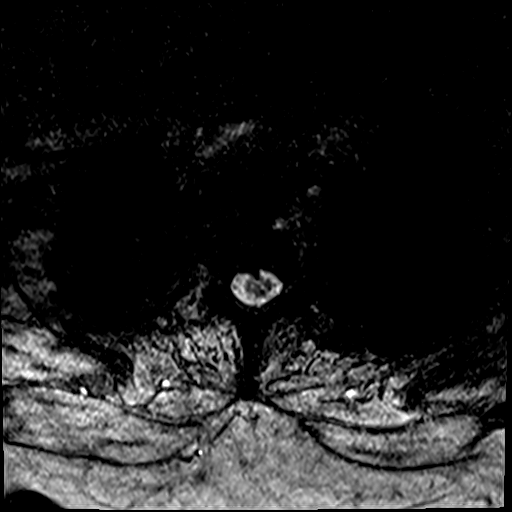
[im 10/35]
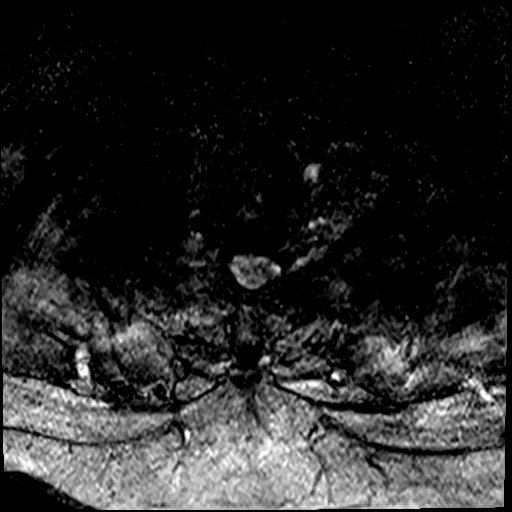
[im 15/35]
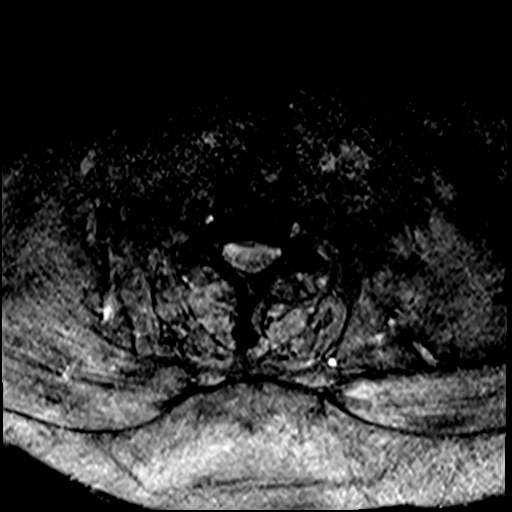
[im 20/35]
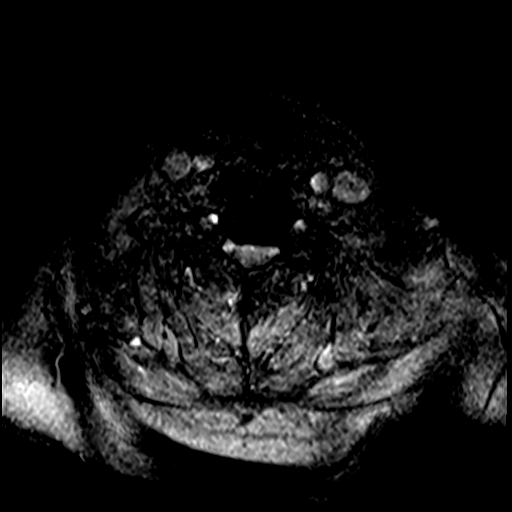
[im 25/35]
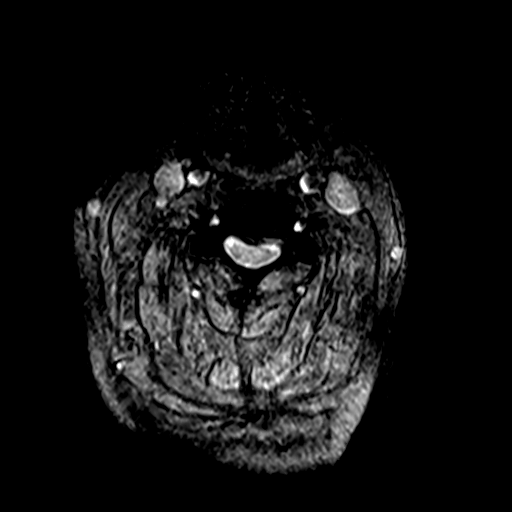
[im 30/35]
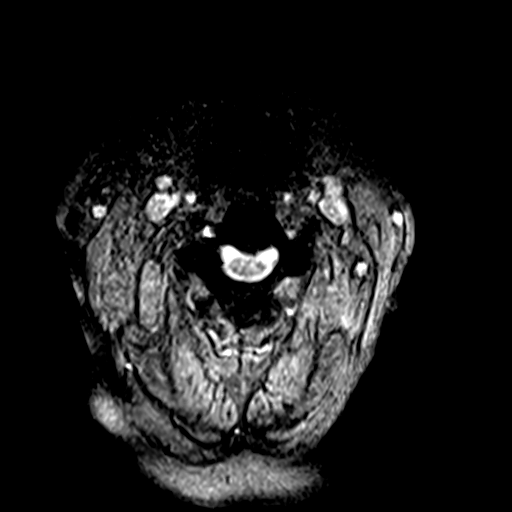
[im 35/35]
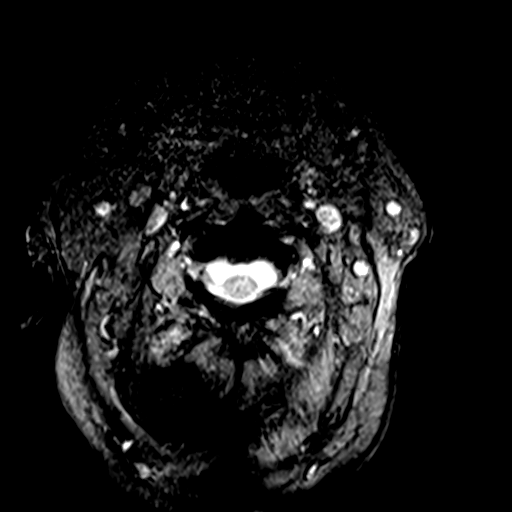

[37 of 48 positions shown; findings below may reference images not displayed]

FINDINGS: Alignment: Straightening.  No substantial sagittal subluxation.

Vertebrae: Vertebral body heights are maintained. No focal marrow
edema to suggest acute fracture or discitis/osteomyelitis.

Cord: No appreciable paraspinal edema. Partially imaged partially
empty sella. Posterior fossa is unremarkable.

Posterior Fossa, vertebral arteries, paraspinal tissues: Visualized
vertebral artery flow voids are maintained.

Disc levels:

C2-C3: Posterior disc osteophyte complex with bilateral facet and
uncovertebral hypertrophy. Resulting mild right foraminal stenosis
without significant canal or left foraminal stenosis.

C3-C4: Posterior disc osteophyte complex and bilateral facet
uncovertebral hypertrophy. Mild bilateral foraminal stenosis.
Moderate to severe canal stenosis with disc contacting and deforming
the cord.

C4-C5: Posterior disc osteophyte complex with left greater than
right facet and uncovertebral hypertrophy. Resulting moderate to
severe left and moderate right foraminal stenosis with moderate to
severe canal stenosis. Posterior disc osteophyte complex contacts
and deforms the ventral cord.

C5-C6: Posterior disc osteophyte complex with left greater than
right facet and uncovertebral hypertrophy. Resulting moderate
bilateral foraminal stenosis with mild to moderate canal stenosis.

C6-C7: Posterior disc osteophyte complex and mild left greater than
right facet and uncovertebral hypertrophy. Resulting mild canal
stenosis without significant foraminal stenosis.

C7-T1: Facet hypertrophy, right greater than left. Resulting mild
right foraminal stenosis without significant canal or left foraminal
stenosis.
IMPRESSION: 1. Moderate to severe canal stenosis at C3-C4 and C4-C5 with
posterior disc/osteophyte complexes contacting and deforming the
cord. Findings may be mildly progressed at C3-C4. Mild to moderate
canal stenosis at C5-C6 and mild canal stenosis at C6-C7.
2. Moderate to severe left and moderate right foraminal stenosis at
C4-C5. Moderate bilateral foraminal stenosis C5-C6. Mild bilateral
foraminal stenosis C3-C4. Findings are similar to prior

## 2021-11-29 NOTE — Telephone Encounter (Signed)
Patient in office today to cancel all upcoming PTNS. Patient will be having foot surgery on 3/10 and will be unable come out for 10-12 weeks. Patient will call office back to reschedule once she is available.

## 2021-11-29 NOTE — Telephone Encounter (Signed)
° °  Pre-operative Risk Assessment    Patient Name: Laura [name]  DOB: Apr 11, 1961 MRN: [id number]      Request for Surgical Clearance    Procedure:   [place] Bunion repair second metatarsal osteotomy hammertoe repair with pin.  Date of Surgery:  Clearance 12/17/21                                 Surgeon:  Jerilynn Mages. Roselind Messier, DPM Surgeon's Group or Practice Name:  [facility] Phone number:  [phone] Fax number:  [phone]    Type of Clearance Requested:   - Medical  - Pharmacy:  Hold    did not state    Type of Anesthesia:  General  2  Additional requests/questions:   can respond via Epic in basket or fax to [phone]  Signed, Desma Paganini   11/29/2021, 3:59 PM

## 2021-11-30 ENCOUNTER — Telehealth: Payer: Self-pay

## 2021-11-30 ENCOUNTER — Telehealth: Payer: Self-pay | Admitting: Urology

## 2021-11-30 NOTE — Telephone Encounter (Signed)
Laura [name] called requesting a nurses aid to come help her after her surgery on 12/17/2021. She stated she lives alone and would like someone to help her bathe and help with ADL's. Please advise

## 2021-11-30 NOTE — Telephone Encounter (Signed)
Patient with diagnosis of afib on Eliquis for anticoagulation.    Procedure: [place] Bunion repair second metatarsal osteotomy hammertoe repair with pin Date of procedure: 12/17/21  CHA2DS2-VASc Score = 6   This indicates a 9.7% annual risk of stroke. The patient's score is based upon: CHF History: 1 HTN History: 1 Diabetes History: 1 (TIA) Stroke History: 2 Vascular Disease History: 0 Age Score: 0 Gender Score: 1      Patient self reports history of TIA several years ago. No outside records of this are available.   CrCl 97 ml/min  ACC/AHA recommends against bridging with DOCAs  Per office protocol, patient can hold Eliquis for 1-2 days prior to procedure.

## 2021-11-30 NOTE — Telephone Encounter (Signed)
Patient would not be a candidate for home health seeing this is not a non-weight bearing procedure.

## 2021-11-30 NOTE — Telephone Encounter (Signed)
DOS - 12/17/21  [place] BUNIONECTOMY RIGHT --- 22241 METATARSAL OSTEOTOMY 2ND RIGHT --- 14643 HAMMERTOE REPAIR 2ND RIGHT --- 14276  [facility] EFFECTIVE DATE - 10/10/21  PLAN DEDUCTIBLE - $0.00  OUT OF POCKET - $8,300.00 W/ $7,011.00 REMAINING COINSURANCE - 20% COPAY - $0.00   PER [employer]HC WEBSITE FOR CPT CODES 34961, [phone] AND 39122 Notification or Prior Authorization is not required for the requested services  This [facility] Advantage members plan does not currently require a prior authorization for these services. If you have general questions about the prior authorization requirements, please call us at [phone] or visit [email or web address] > Clinician Resources > Advance and Admission Notification Requirements. The number above acknowledges your notification. Please write this number down for future reference. Notification is not a guarantee of coverage or payment.  Decision ID #:[id number]

## 2021-12-01 NOTE — Telephone Encounter (Signed)
° °  Patient Name: Laura [name]  DOB: 06-12-1961 MRN: [id number]  Primary Cardiologist: Carlyle Dolly, MD  Chart reviewed as part of pre-operative protocol coverage. Patient contacted as part of preoperative screening process. No answer.  Left voicemail for patient to call back.   Lenna Sciara, NP 12/01/2021, 8:56 AM

## 2021-12-01 NOTE — Telephone Encounter (Signed)
° °  Name: Laura [name]  DOB: [birth year]  MRN: [id number]   Primary Cardiologist: Carlyle Dolly, MD  Chart reviewed as part of pre-operative protocol coverage. Patient was contacted 12/01/2021 in reference to pre-operative risk assessment for pending surgery as outlined below.  Laura [name] was last seen on 07/14/2021 by Dr. Carlyle Dolly.  Since that day, Laura [name] has done well from a cardiac standpoint. She denies any new symptoms or concerns and is exercising regularly at her local [employer] program.  According to the Revised Cardiac Risk Index (RCRI), her perioperative risk of major cardiac event is 11%. Her METs are 6.33 according to the Duke Activity Status Index (DASI).Therefore, based on ACC/AHA guidelines, the patient would be at acceptable risk for the planned procedure without further cardiovascular testing.   The patient was advised that if she develops new symptoms prior to surgery to contact our office to arrange for a follow-up visit, and she verbalized understanding.  Patient with diagnosis of afib on Eliquis for anticoagulation.     Procedure: [place] Bunion repair second metatarsal osteotomy hammertoe repair with pin Date of procedure: 12/17/21   CHA2DS2-VASc Score = 6   This indicates a 9.7% annual risk of stroke. The patient's score is based upon: CHF History: 1 HTN History: 1 Diabetes History: 1 (TIA) Stroke History: 2 Vascular Disease History: 0 Age Score: 0 Gender Score: 1       Patient self reports history of TIA several years ago. No outside records of this are available.    CrCl 97 ml/min   ACC/AHA recommends against bridging with DOCAs   Per office protocol, patient can hold Eliquis for 1-2 days prior to procedure.    I will route this recommendation to the requesting party via Epic fax function and remove from pre-op pool. Please call with questions.  Lenna Sciara, NP 12/01/2021, 9:26 AM

## 2021-12-06 ENCOUNTER — Other Ambulatory Visit: Payer: Self-pay | Admitting: *Deleted

## 2021-12-06 MED ORDER — FASENRA PEN 30 MG/ML ~~LOC~~ SOAJ: SUBCUTANEOUS | 6 refills | Status: DC

## 2021-12-06 NOTE — Telephone Encounter (Signed)
L/m for patient approval and refill for Fasenra sent to [facility] and number given to reach out to reorder

## 2021-12-07 ENCOUNTER — Telehealth: Payer: Self-pay | Admitting: Neurology

## 2021-12-07 NOTE — Telephone Encounter (Signed)
Told patient if she could call the prescribing doctor and let him know of her side effects and find out the name of the medication and see if he can put her on another medication.  To call us back and let us know if they change

## 2021-12-07 NOTE — Telephone Encounter (Signed)
Pt called in stating one of her doctor's just put her on a new medication for her diabetic neuropathy. She cannot find the name of the medication, but she will keep looking. The medication is helping her neuropathy, but it's making her hands shake again.

## 2021-12-08 ENCOUNTER — Telehealth: Payer: Self-pay

## 2021-12-08 ENCOUNTER — Other Ambulatory Visit: Payer: Self-pay

## 2021-12-08 ENCOUNTER — Ambulatory Visit (INDEPENDENT_AMBULATORY_CARE_PROVIDER_SITE_OTHER): Payer: Medicare Other | Admitting: Clinical

## 2021-12-08 ENCOUNTER — Telehealth: Payer: Self-pay | Admitting: Neurology

## 2021-12-08 DIAGNOSIS — F4310 Post-traumatic stress disorder, unspecified: Secondary | ICD-10-CM | POA: Diagnosis not present

## 2021-12-08 DIAGNOSIS — R519 Headache, unspecified: Secondary | ICD-10-CM

## 2021-12-08 NOTE — Telephone Encounter (Signed)
Patient called and stated she is unhappy with the current neurologist due to the fact that the provider prescribe her medications without her knowledge. I advised patient that we could possibly refer her elsewhere but to try to resolve the issue with hr current provider as there may be a delay in transfer and other providers may not want to pick her up as she has established care with someone already. I advised patient to reach out to her provider and communicate her dislikes and advocate for herself to ensure her health is being treated the way she wants. Patient stated that she could also contact [facility] as she was a patient there before. I stated that she could do that since she was a patient there once before. Patient verbalized understanding and agreed to do so. I advised patient that a nurse or referral coordinator would contact her with the next steps.  ?

## 2021-12-08 NOTE — Progress Notes (Signed)
Virtual Visit via Video Note ?  ?I connected with Laura [name] on 12/08/21 at  1:00 PM EST by a video enabled telemedicine application and verified that I am speaking with the correct person using two identifiers. ?  ?Location: ?Patient: Home ?Provider: Office ?  ?I discussed the limitations of evaluation and management by telemedicine and the availability of in person appointments. The patient expressed understanding and agreed to proceed. ?  ?THERAPIST PROGRESS NOTE ?  ?Session Time: 1:00 PM-:1:45 PM ?  ?Participation Level: Active ?  ?Behavioral Response: CasualAlertDepressed ?  ?Type of Therapy: Individual Therapy ?  ?Treatment Goals addressed: Coping ?  ?Interventions: CBT, DBT, Solution Focused, Strength-based and Supportive ?  ?Summary: Laura [name] is a 61 y.o. female who presents with PTSD. The OPT therapist worked with the patient for her ongoing OPT treatment session. The OPT therapist utilized Motivational Interviewing to assist in creating therapeutic repore. The patient in the session was engaged and work in collaboration giving feedback about her triggers and symptoms over the past few weeks. The patient spoke about looking forward to upcoming celebration for her birthday party being celebrated on March 4th. The patients relationship with her middle daughter has improved and this has allowed more communication and more of a pathway for the patient to be able to see and be involved in her grandchildren's life. The patient has been attending the [facility] doing Zumba for exercise.The patient spoke about her involvement in going to [place].The OPT therapist utilized Cognitive Behavioral Therapy through cognitive restructuring as well as worked with the patient on coping strategies to assist in management of mental health symptoms. The patient reviewed her ongoing work on her physical health. The OPT therapist placed emphasis on the patient keeping all upcoming health appointments and adhering to ongoing  health treatment recommendations.  ?  ?Suicidal/Homicidal: Nowithout intent/plan ?  ?Therapist Response:The OPT therapist worked with the patient for the patients scheduled session. The patient was engaged in her session and gave feedback in relation to triggers, symptoms, and behavior responses over the past few weeks. The OPT therapist worked with the patient utilizing an in session Cognitive Behavioral Therapy exercise. The patient was responsive in the session and verbalized, " My birthday was just such a great day It was even better than I could have asked for or expected". The patient spoke about her interactions with her daughters and this has been a positive for the patient . The patient spoke about her health problems with a scheduled appointment upcoming with neurologist and a scheduled upcoming surgery procedure on March 10 th with the recovery post surgery taking several weeks around 10-12.  Post recovery the patient spoke about her plans to travel to [place] to visit with her daughter as this was in part plan of her last travel plans which occurred during the Christmas holiday 2022, however, the patient did not make it out to [place] from [place].The patient will be utilizing a combination of friends, family and church family to assist with her needs and mobility during her recovery process.The OPT therapist will continue treatment work with the patient in her next scheduled session. ?  ?Plan: Return again in 2/3 weeks. ?  ?Diagnosis:      Axis I:Post Traumatic Stress Disorder ?  ?                        Axis II: No diagnosis ? ? ?Collaboration of Care: Review/ Collaborative care of patient involvement with  Dr. Harrington Challenger the patients psychiatrist. ?  ?Patient/Guardian was advised Release of Information must be obtained prior to any record release in order to collaborate their care with an outside provider. Patient/Guardian was advised if they have not already done so to contact the registration  department to sign all necessary forms in order for [place] to release information regarding their care.  ?  ?Consent: Patient/Guardian gives verbal consent for treatment and assignment of benefits for services provided during this visit. Patient/Guardian expressed understanding and agreed to proceed.  ? ? ? ?  ?I discussed the assessment and treatment plan with the patient. The patient was provided an opportunity to ask questions and all were answered. The patient agreed with the plan and demonstrated an understanding of the instructions. ?  ?The patient was advised to call back or seek an in-person evaluation if the symptoms worsen or if the condition fails to improve as anticipated. ?  ?I provided 45 minutes of non-face-to-face time during this encounter. ?  ?[name], LCSW ?  ?12/08/2021 ?

## 2021-12-08 NOTE — Telephone Encounter (Signed)
Patient is calling chelsea back regarding their conversation ?

## 2021-12-08 NOTE — Telephone Encounter (Signed)
Called patient and she is just letting me know what is going on with the diabetic neuropathy doctor she has been seeing  ?

## 2021-12-09 ENCOUNTER — Encounter (HOSPITAL_COMMUNITY): Payer: Self-pay

## 2021-12-09 ENCOUNTER — Emergency Department (HOSPITAL_COMMUNITY): Payer: Medicare Other

## 2021-12-09 ENCOUNTER — Other Ambulatory Visit: Payer: Self-pay

## 2021-12-09 ENCOUNTER — Emergency Department (HOSPITAL_COMMUNITY)
Admission: EM | Admit: 2021-12-09 | Discharge: 2021-12-09 | Disposition: A | Discharge status: Home Health | Payer: Medicare Other | Attending: Emergency Medicine | Admitting: Emergency Medicine

## 2021-12-09 DIAGNOSIS — I4891 Unspecified atrial fibrillation: Secondary | ICD-10-CM | POA: Diagnosis not present

## 2021-12-09 DIAGNOSIS — Z794 Long term (current) use of insulin: Secondary | ICD-10-CM | POA: Insufficient documentation

## 2021-12-09 DIAGNOSIS — Z7984 Long term (current) use of oral hypoglycemic drugs: Secondary | ICD-10-CM | POA: Diagnosis not present

## 2021-12-09 DIAGNOSIS — R079 Chest pain, unspecified: Secondary | ICD-10-CM

## 2021-12-09 DIAGNOSIS — M25561 Pain in right knee: Secondary | ICD-10-CM | POA: Diagnosis not present

## 2021-12-09 DIAGNOSIS — R55 Syncope and collapse: Secondary | ICD-10-CM | POA: Insufficient documentation

## 2021-12-09 DIAGNOSIS — E1165 Type 2 diabetes mellitus with hyperglycemia: Secondary | ICD-10-CM | POA: Diagnosis not present

## 2021-12-09 DIAGNOSIS — Z7901 Long term (current) use of anticoagulants: Secondary | ICD-10-CM | POA: Diagnosis not present

## 2021-12-09 DIAGNOSIS — M79651 Pain in right thigh: Secondary | ICD-10-CM | POA: Insufficient documentation

## 2021-12-09 DIAGNOSIS — Z79899 Other long term (current) drug therapy: Secondary | ICD-10-CM | POA: Insufficient documentation

## 2021-12-09 LAB — CBC WITH DIFFERENTIAL/PLATELET
Abs Immature Granulocytes: 0.03 10*3/uL (ref 0.00–0.07)
Basophils Absolute: 0 10*3/uL (ref 0.0–0.1)
Basophils Relative: 1 %
Eosinophils Absolute: 0 10*3/uL (ref 0.0–0.5)
Eosinophils Relative: 0 %
HCT: 40.6 % (ref 36.0–46.0)
Hemoglobin: 14.2 g/dL (ref 12.0–15.0)
Immature Granulocytes: 1 %
Lymphocytes Relative: 31 %
Lymphs Abs: 1.4 10*3/uL (ref 0.7–4.0)
MCH: 32.4 pg (ref 26.0–34.0)
MCHC: 35 g/dL (ref 30.0–36.0)
MCV: 92.7 fL (ref 80.0–100.0)
Monocytes Absolute: 0.4 10*3/uL (ref 0.1–1.0)
Monocytes Relative: 8 %
Neutro Abs: 2.6 10*3/uL (ref 1.7–7.7)
Neutrophils Relative %: 59 %
Platelets: 162 10*3/uL (ref 150–400)
RBC: 4.38 MIL/uL (ref 3.87–5.11)
RDW: 12.4 % (ref 11.5–15.5)
WBC: 4.4 10*3/uL (ref 4.0–10.5)
nRBC: 0 % (ref 0.0–0.2)

## 2021-12-09 LAB — BASIC METABOLIC PANEL
Anion gap: 12 (ref 5–15)
BUN: 25 mg/dL — ABNORMAL HIGH (ref 6–20)
CO2: 22 mmol/L (ref 22–32)
Calcium: 9.6 mg/dL (ref 8.9–10.3)
Chloride: 105 mmol/L (ref 98–111)
Creatinine, Ser: 0.76 mg/dL (ref 0.44–1.00)
GFR, Estimated: >60 mL/min (ref >60)
Glucose, Bld: 175 mg/dL — ABNORMAL HIGH (ref 70–99)
Potassium: 4.5 mmol/L (ref 3.5–5.1)
Sodium: 139 mmol/L (ref 135–145)

## 2021-12-09 LAB — PROTIME-INR
INR: 1.2 (ref 0.8–1.2)
Prothrombin Time: 15.3 seconds — ABNORMAL HIGH (ref 11.4–15.2)

## 2021-12-09 LAB — TROPONIN I (HIGH SENSITIVITY)
Troponin I (High Sensitivity): 15 ng/L (ref <18)
Troponin I (High Sensitivity): 15 ng/L (ref <18)

## 2021-12-09 IMAGING — DX DG CHEST 1V PORT
1 series · 1 of 1 positions shown · non-contrast
Comparison: Chest x-ray dated [DATE].

CLINICAL DATA: Chest pain and syncope after working out.

EXAM:
PORTABLE CHEST 1 VIEW

[chest ap]
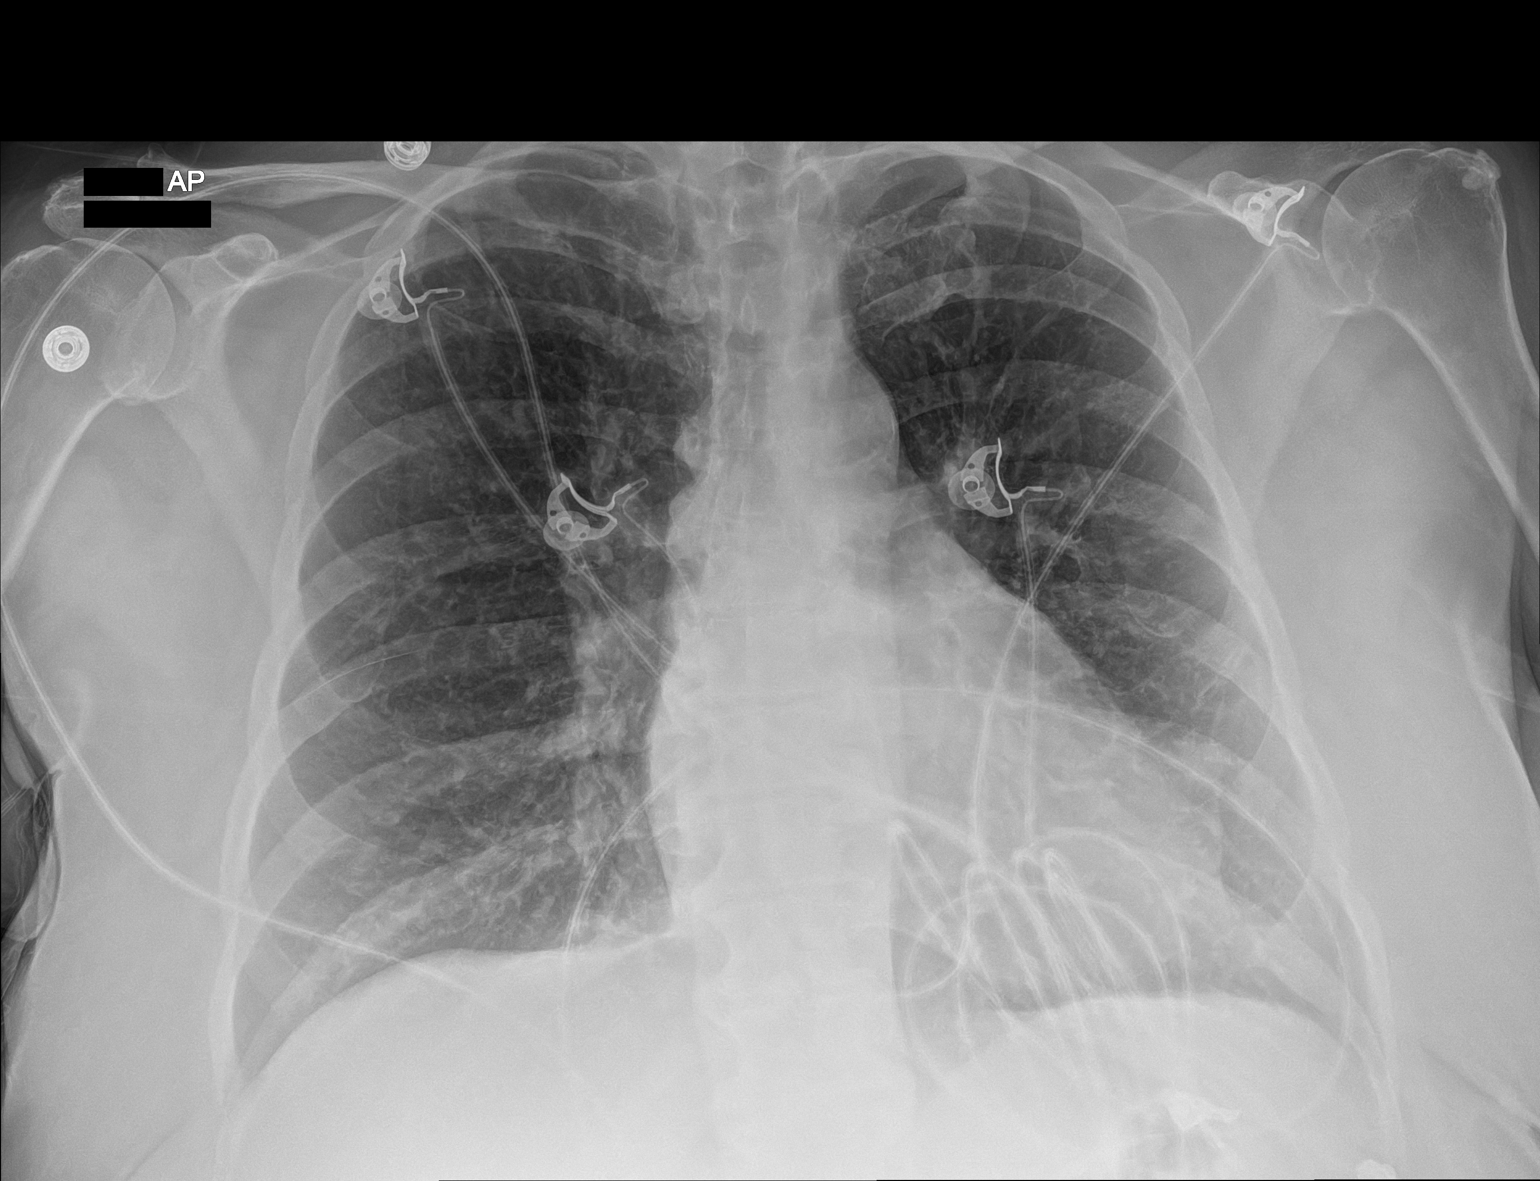

[1 of 1 positions shown; findings below may reference images not displayed]

FINDINGS: The heart size and mediastinal contours are within normal limits.
Both lungs are clear. The visualized skeletal structures are
unremarkable.
IMPRESSION: No active disease.

## 2021-12-09 IMAGING — US US EXTREM LOW VENOUS*R*
1 series · 14 of 24 positions shown · non-contrast
Comparison: None.

CLINICAL DATA: Right knee pain

EXAM:
RIGHT LOWER EXTREMITY VENOUS DOPPLER ULTRASOUND
TECHNIQUE: Gray-scale sonography with compression, as well as color and duplex
ultrasound, were performed to evaluate the deep venous system(s)
from the level of the common femoral vein through the popliteal and
proximal calf veins.

[Series 1: us venous img lower uni right (dvt) · portal-venous · 14 of 41 slices shown]
[im 1/41]
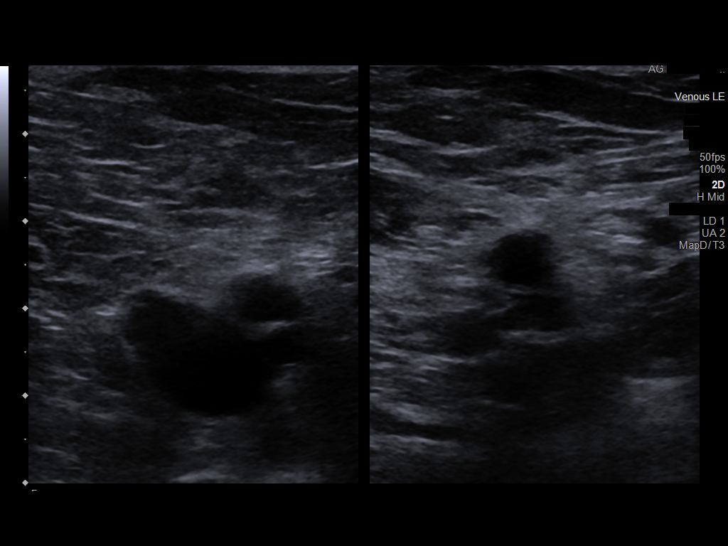
[im 4/41]
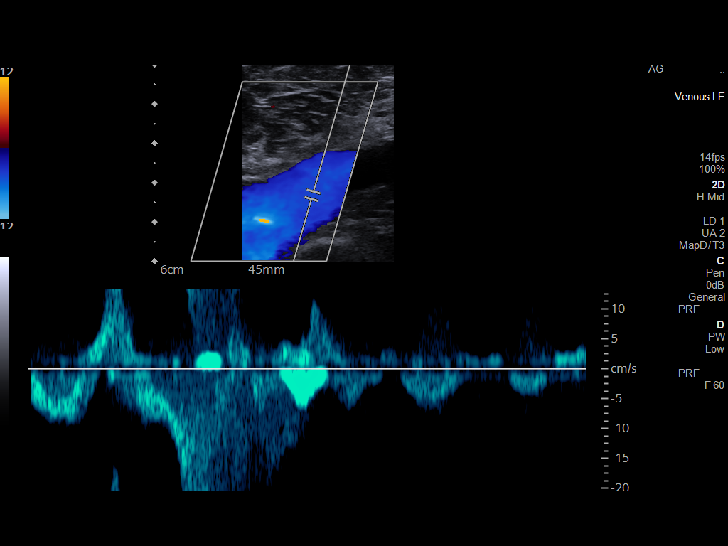
[im 7/41]
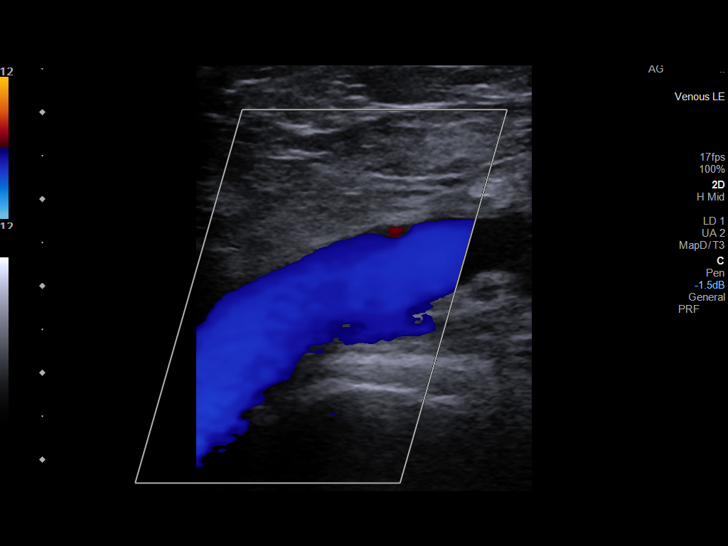
[im 11/41]
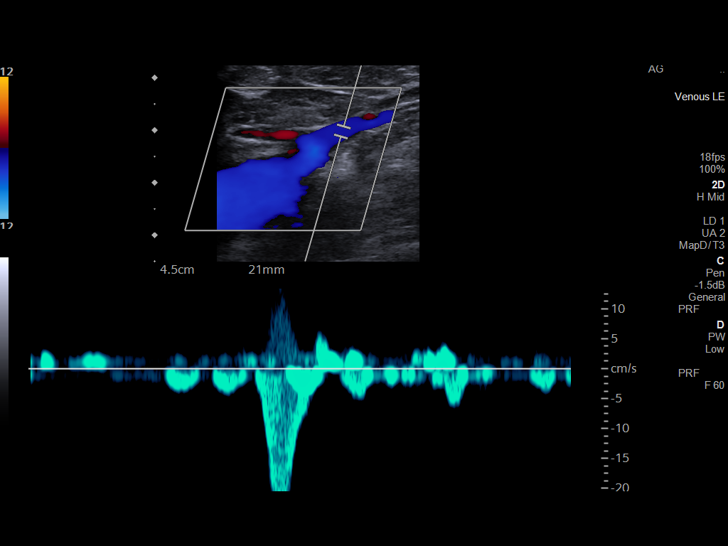
[im 13/41]
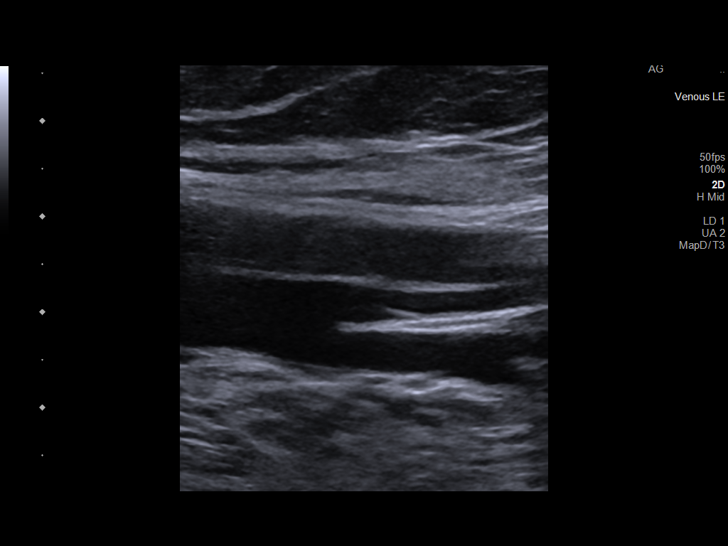
[im 16/41]
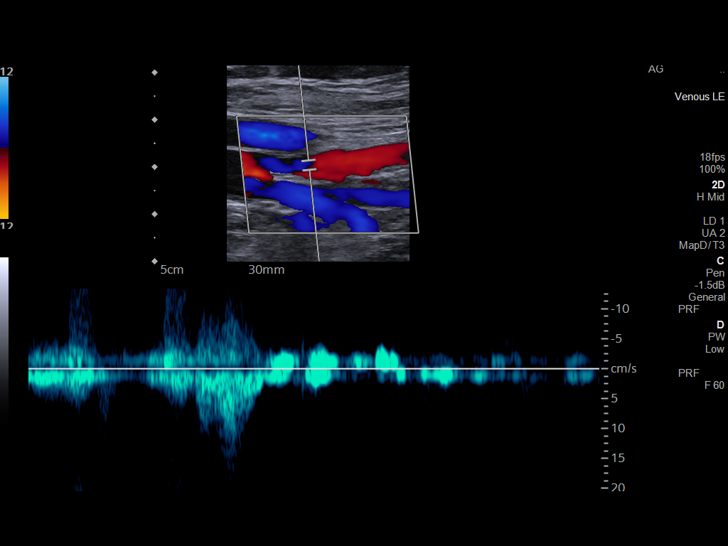
[im 20/41]
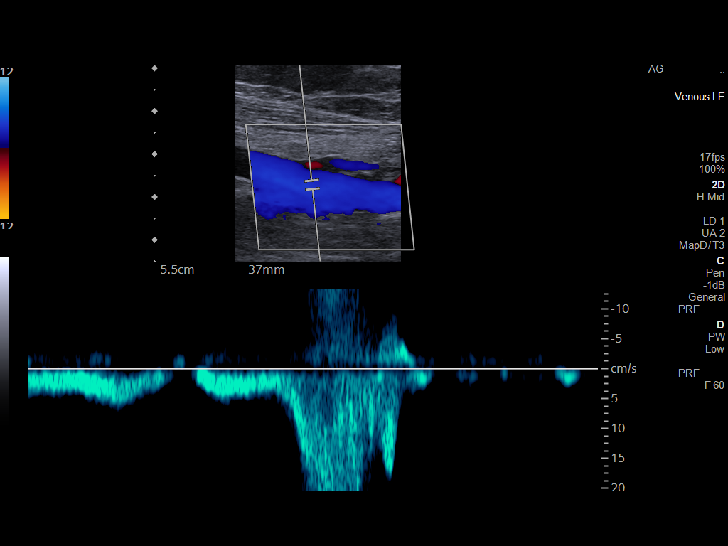
[im 21/41]
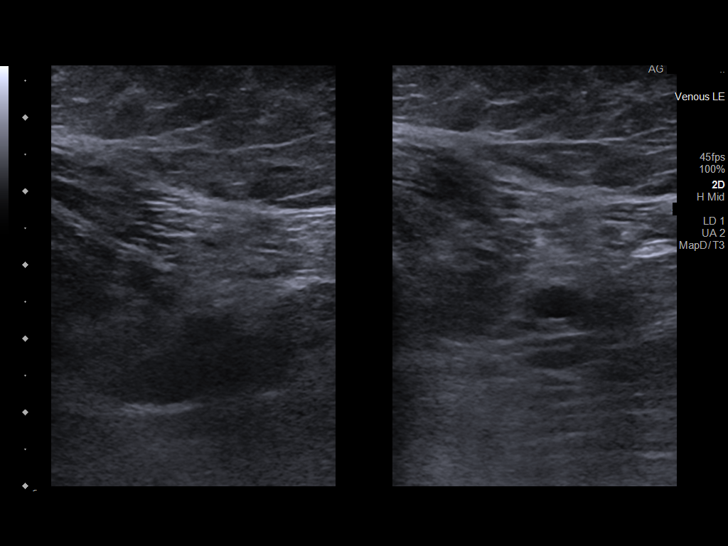
[im 25/41]
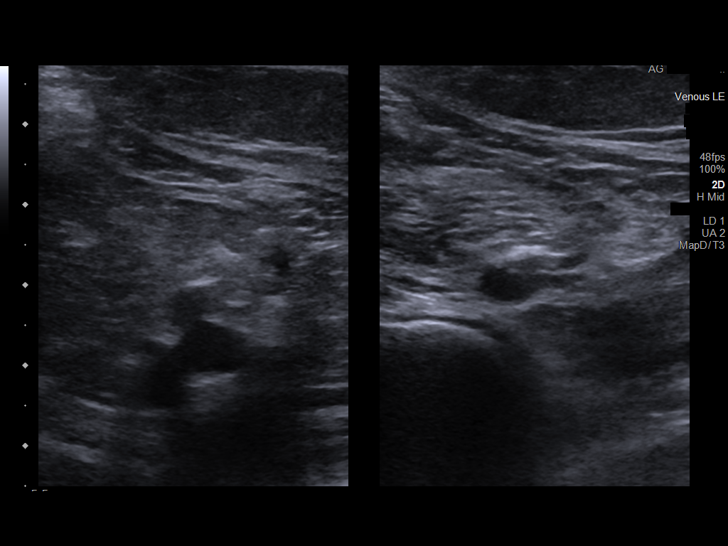
[im 28/41]
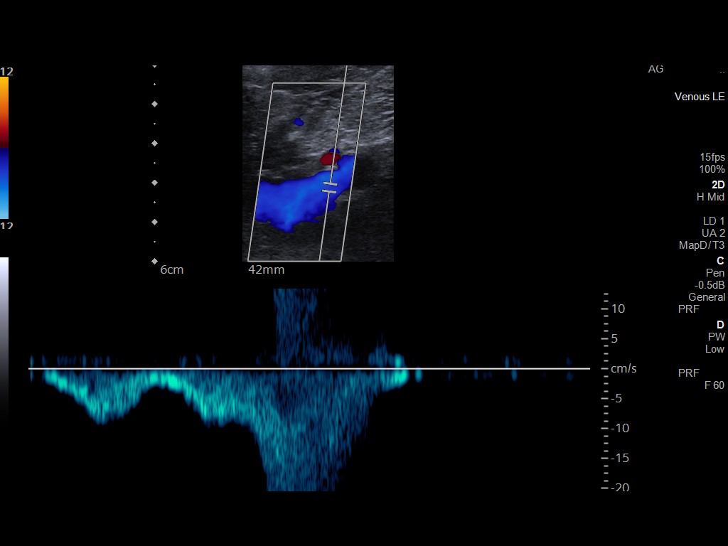
[im 32/41]
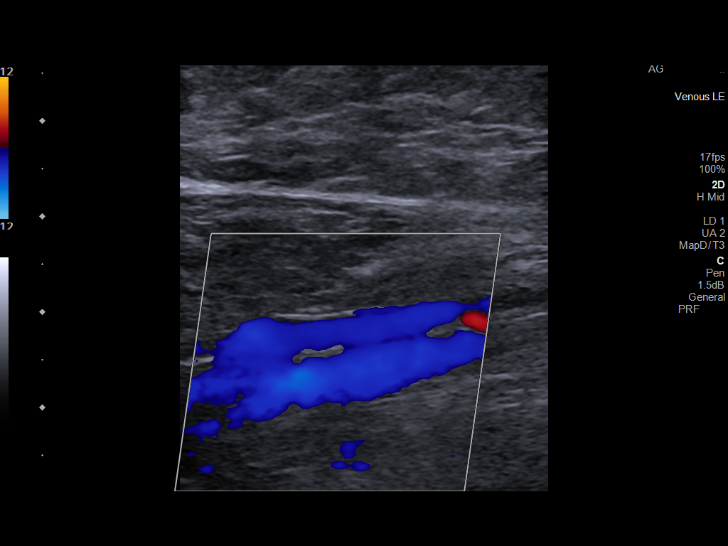
[im 34/41]
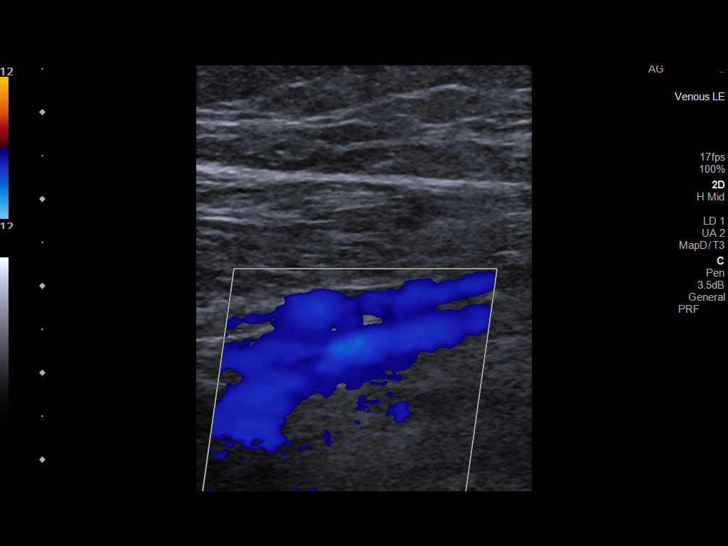
[im 37/41]
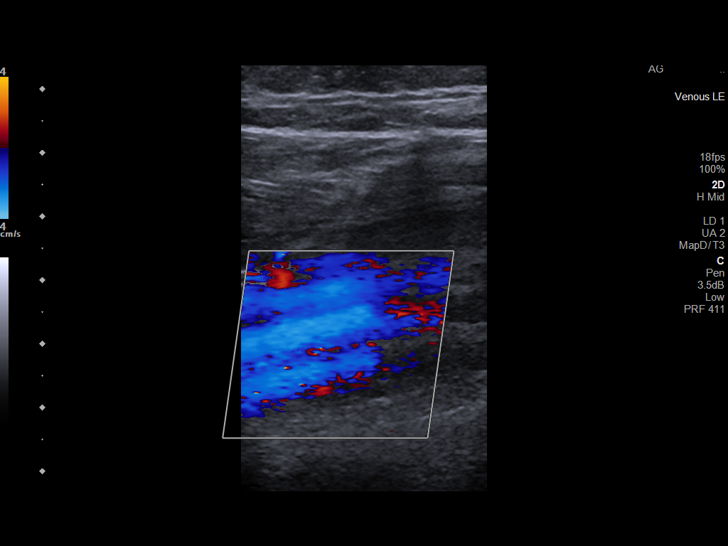
[im 41/41]
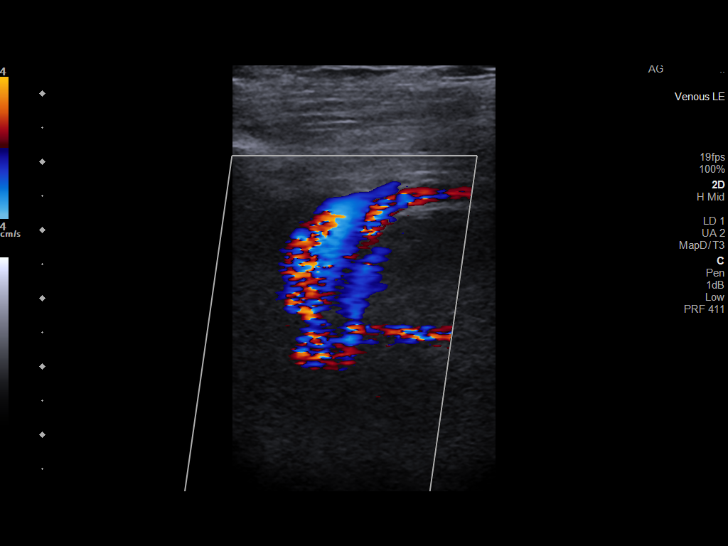

[14 of 24 positions shown; findings below may reference images not displayed]

FINDINGS: VENOUS

Normal compressibility of the common femoral, superficial femoral,
and popliteal veins, as well as the visualized calf veins.
Visualized portions of profunda femoral vein and great saphenous
vein unremarkable. No filling defects to suggest DVT on grayscale or
color Doppler imaging. Doppler waveforms show normal direction of
venous flow, normal respiratory plasticity and response to
augmentation.

Limited views of the contralateral common femoral vein are
unremarkable.

OTHER

None.

Limitations: none
IMPRESSION: No evidence of right lower extremity DVT.

## 2021-12-09 MED ORDER — KETOROLAC TROMETHAMINE 30 MG/ML IJ SOLN
30.0000 mg | Freq: Once | INTRAMUSCULAR | Status: AC
  Administered 2021-12-09: 30 mg via INTRAVENOUS
  Filled 2021-12-09: qty 1

## 2021-12-09 MED ORDER — ONDANSETRON 4 MG PO TBDP: 4.0000 mg | ORAL_TABLET | Freq: Once | ORAL | Status: AC

## 2021-12-09 MED ORDER — ONDANSETRON 4 MG PO TBDP
ORAL_TABLET | ORAL | Status: AC
  Administered 2021-12-09: 4 mg via ORAL
  Filled 2021-12-09: qty 1

## 2021-12-09 MED ORDER — HYDROCODONE-ACETAMINOPHEN 5-325 MG PO TABS
2.0000 | ORAL_TABLET | Freq: Once | ORAL | Status: AC
  Administered 2021-12-09: 2 via ORAL
  Filled 2021-12-09: qty 2

## 2021-12-09 MED ORDER — NAPROXEN 500 MG PO TABS: 500.0000 mg | ORAL_TABLET | Freq: Two times a day (BID) | ORAL | 0 refills | Status: DC

## 2021-12-09 NOTE — ED Provider Notes (Signed)
Laura [name] Note   CSN: [id number] Arrival date & time: 12/09/21  1056     History  Chief Complaint  Patient presents with   Chest Pain    Laura [name] is a 61 y.o. female.   Chest Pain  This patient is a 62 year old female, she has a prior history of atrial fibrillation, she is on a apixaban, she has a history of prior heart surgery when she was very young, she has not had any complications since that time with regards to her heart and in fact she goes to the gym fairly regularly without any complications.  She was at the gym today where she was taking part in an aerobic style class, at the end of the class she was feeling just fine when all of a sudden she felt very sweaty, lightheaded and then had a syncopal episode.  She was helped into a chair and had a brief loss of consciousness.  She does have some associated chest pain with it and at this time still has a mild to moderate ongoing feeling of sharpness which radiates to her shoulder and down her arm.  No headache no blurred vision no numbness or weakness no abdominal pain no diarrhea, no back pain, no swelling of the legs.  She has been compliant with her medications.  She has chronic A-fib  Home Medications Prior to Admission medications   Medication Sig Start Date End Date Taking? Authorizing [name]  naproxen (NAPROSYN) 500 MG tablet Take 1 tablet (500 mg total) by mouth 2 (two) times daily with a meal. 12/09/21  Yes Noemi Chapel, MD  acetaminophen (TYLENOL) 325 MG tablet Take 2 tablets (650 mg total) by mouth every 6 (six) hours as needed for mild pain, fever or headache. 08/11/18   Roxan Hockey, MD  albuterol (PROVENTIL) (2.5 MG/3ML) 0.083% nebulizer solution Take 3 mLs (2.5 mg total) by nebulization every 4 (four) hours as needed for wheezing or shortness of breath. 04/07/21   Althea Charon, FNP  albuterol (VENTOLIN HFA) 108 (90 Base) MCG/ACT inhaler INHALE 2 PUFFS INTO THE LUNGS EVERY 6  HOURS AS NEEDED FOR WHEEZING OR SHORTNESS OF BREATH 04/07/21   Althea Charon, FNP  apixaban (ELIQUIS) 5 MG TABS tablet Take 1 tablet (5 mg total) by mouth 2 (two) times daily. 07/14/21   Arnoldo Lenis, MD  atorvastatin (LIPITOR) 10 MG tablet Take 10 mg by mouth every evening.     [name], Historical, MD  BD PEN NEEDLE NANO 2ND GEN 32G X 4 MM MISC 1 each by Other route in the morning, at noon, in the evening, and at bedtime. 05/20/20   [name], Historical, MD  Benralizumab [facility] PEN) 30 MG/ML SOAJ INJECT 30MG SUBCUTANEOUSLY  EVERY 8 WEEKS 12/06/21   Valentina Shaggy, MD  bisoprolol (ZEBETA) 5 MG tablet Take 0.5 tablets (2.5 mg total) by mouth daily. 07/14/21   Arnoldo Lenis, MD  blood glucose meter kit and supplies 1 each by Other route 4 (four) times daily. Dispense based on patient and insurance preference. Use up to four times daily as directed. (FOR ICD-10 E10.9, E11.9). 01/27/21   Cassandria Anger, MD  Cholecalciferol (VITAMIN D3) 125 MCG (5000 UT) CAPS Take 1 capsule (5,000 Units total) by mouth daily. 11/25/20   Cassandria Anger, MD  Continuous Blood Gluc Sensor (FREESTYLE LIBRE 3 SENSOR) MISC 1 Piece by Does not apply route every 14 (fourteen) days. Place 1 sensor on the skin every 14 days. Use to  check glucose continuously 10/28/21   Cassandria Anger, MD  diltiazem (CARDIZEM CD) 360 MG 24 hr capsule TAKE 1 CAPSULE(360 MG) BY MOUTH DAILY 03/01/21   Arnoldo Lenis, MD  divalproex (DEPAKOTE ER) 500 MG 24 hr tablet Take 1 tablet (500 mg total) by mouth daily AND 2 tablets (1,000 mg total) at bedtime. 10/27/21 01/25/22  [name], MD  Dulaglutide (TRULICITY) 1.5 JQ/4.9EE SOPN ADMINISTER 1.5 MG UNDER THE SKIN ONCE A WEEK ON SUNDAY 10/28/21   Cassandria Anger, MD  ELDERBERRY PO Take 4 g by mouth daily. Gummies 2g each    [name], Historical, MD  EPINEPHrine 0.3 mg/0.3 mL IJ SOAJ injection Inject 0.3 mg into the muscle once as needed for anaphylaxis. 01/17/20    [name], Historical, MD  ferrous sulfate 324 MG TBEC Take 324 mg by mouth daily with breakfast.    [name], Historical, MD  fluticasone furoate-vilanterol (BREO ELLIPTA) 100-25 MCG/ACT AEPB Inhale 1 puff into the lungs daily. 10/27/21   Rigoberto Noel, MD  gabapentin (NEURONTIN) 600 MG tablet Take 600 mg by mouth 3 (three) times daily. 07/20/21   [name], Historical, MD  hydrALAZINE (APRESOLINE) 25 MG tablet Take 25 mg by mouth 3 (three) times daily.    [name], Historical, MD  hydrOXYzine (ATARAX/VISTARIL) 25 MG tablet Take 1 tablet (25 mg total) by mouth every 6 (six) hours as needed for anxiety or nausea. 08/11/18   Roxan Hockey, MD  Immune Globulin, Human, 4 GM/20ML SOLN Inject 100 mLs into the skin every 14 (fourteen) days.    [name], Historical, MD  insulin glargine (LANTUS SOLOSTAR) 100 UNIT/ML Solostar Pen Inject 40 Units into the skin at bedtime. 10/28/21   Cassandria Anger, MD  Insulin Pen Needle (PEN NEEDLES 3/16") 31G X 5 MM MISC Use as directed with insulin pen 10/21/21   Cassandria Anger, MD  levothyroxine (SYNTHROID, LEVOTHROID) 112 MCG tablet Take 112 mcg by mouth daily before breakfast.    [name], Historical, MD  linaclotide Rolan Lipa) 145 MCG CAPS capsule Take 1 capsule (145 mcg total) by mouth daily before breakfast. 06/07/21   Mahala Menghini, PA-C  Melatonin 3 MG TABS Take 3 mg by mouth at bedtime.    [name], Historical, MD  metFORMIN (GLUCOPHAGE) 500 MG tablet Take 500 mg by mouth 2 (two) times daily. 07/21/21   [name], Historical, MD  metoCLOPramide (REGLAN) 5 MG tablet Take 5 mg by mouth 3 (three) times daily. 04/05/21   [name], Historical, MD  montelukast (SINGULAIR) 10 MG tablet Take 1 tablet by mouth daily. 11/23/20   [name], Historical, MD  Multiple Vitamins-Minerals (MULTIVITAMIN WITH MINERALS) tablet Take 1 tablet by mouth daily. Woman    [name], Historical, MD  Nebulizer MISC Nebulizer tubing kit 10/21/20   Valentina Shaggy, MD  omeprazole  (PRILOSEC) 20 MG capsule TAKE 1 CAPSULE(20 MG) BY MOUTH TWICE DAILY BEFORE A MEAL 04/28/21   Aliene Altes S, PA-C  ondansetron (ZOFRAN) 4 MG tablet Take 1 tablet (4 mg total) by mouth every 8 (eight) hours as needed for nausea or vomiting. 07/13/19   Hassell Done Mary-Margaret, FNP  [facility] ULTRA test strip 1 each by Other route in the morning, at noon, in the evening, and at bedtime. 10/17/19   [name], Historical, MD  polyethylene glycol (MIRALAX / GLYCOLAX) 17 g packet Take 17 g by mouth daily as needed. 04/17/21   [place] Donnamarie Poag, DO  Potassium Chloride ER 20 MEQ TBCR Take 20 mEq by mouth daily. 01/04/21   [name], Historical,  MD  Tiotropium Bromide Monohydrate (SPIRIVA RESPIMAT) 1.25 MCG/ACT AERS Inhale 2 puffs into the lungs daily. 04/08/21   Althea Charon, FNP  topiramate (TOPAMAX) 100 MG tablet Take 1 tablet (100 mg total) by mouth 2 (two) times daily. 08/05/21   Tat, Eustace Quail, DO  torsemide (DEMADEX) 20 MG tablet Take 60 mg by mouth daily.    [name], Historical, MD  triamcinolone cream (KENALOG) 0.1 % Apply 1 application topically 2 (two) times daily. 03/15/21   [name], Historical, MD  vitamin B-12 (CYANOCOBALAMIN) 1000 MCG tablet Take 1,000 mcg by mouth daily.    [name], Historical, MD  vitamin C (ASCORBIC ACID) 500 MG tablet Take 500 mg by mouth daily. Power C immune support    [name], Historical, MD      Allergies    Ativan [lorazepam] and Phenergan [promethazine hcl]    Review of Systems   Review of Systems  Cardiovascular:  Positive for chest pain.  All other systems reviewed and are negative.  Physical Exam Updated Vital Signs BP (!) 175/62    Pulse 71    Temp 98.4 F (36.9 C)    Resp (!) 24    Ht 1.626 m (_0 )    Wt 90.7 kg    SpO2 100%    BMI 34.33 kg/m  Physical Exam Vitals and nursing note reviewed.  Constitutional:      General: She is not in acute distress.    Appearance: She is well-developed.  HENT:     Head: Normocephalic and atraumatic.     Mouth/Throat:      Pharynx: No oropharyngeal exudate.  Eyes:     General: No scleral icterus.       Right eye: No discharge.        Left eye: No discharge.     Conjunctiva/sclera: Conjunctivae normal.     Pupils: Pupils are equal, round, and reactive to light.  Neck:     Thyroid: No thyromegaly.     Vascular: No JVD.  Cardiovascular:     Rate and Rhythm: Normal rate. Rhythm irregular.     Heart sounds: Normal heart sounds. No murmur heard.   No friction rub. No gallop.  Pulmonary:     Effort: Pulmonary effort is normal. No respiratory distress.     Breath sounds: Normal breath sounds. No wheezing or rales.  Abdominal:     General: Bowel sounds are normal. There is no distension.     Palpations: Abdomen is soft. There is no mass.     Tenderness: There is no abdominal tenderness.  Musculoskeletal:        General: No tenderness. Normal range of motion.     Cervical back: Normal range of motion and neck supple.     Right lower leg: No edema.     Left lower leg: No edema.     Comments: The patient does have some tenderness along the right medial and posterior thigh and knee.  There is no obvious swelling of the leg  Lymphadenopathy:     Cervical: No cervical adenopathy.  Skin:    General: Skin is warm and dry.     Findings: No erythema or rash.  Neurological:     General: No focal deficit present.     Mental Status: She is alert.     Coordination: Coordination normal.  Psychiatric:        Behavior: Behavior normal.    ED Results / Procedures / Treatments   Labs (all labs ordered are  listed, but only abnormal results are displayed) Labs Reviewed  BASIC METABOLIC PANEL - Abnormal; Notable for the following components:      Result Value   Glucose, Bld 175 (*)    BUN 25 (*)    All other components within normal limits  PROTIME-INR - Abnormal; Notable for the following components:   Prothrombin Time 15.3 (*)    All other components within normal limits  CBC WITH DIFFERENTIAL/PLATELET   URINALYSIS, ROUTINE W REFLEX MICROSCOPIC  TROPONIN I (HIGH SENSITIVITY)  TROPONIN I (HIGH SENSITIVITY)    EKG EKG Interpretation  Date/Time:  Thursday December 09 2021 11:11:49 EST Ventricular Rate:  80 PR Interval:    QRS Duration: 122 QT Interval:  446 QTC Calculation: 515 R Axis:   70 Text Interpretation: Atrial flutter Nonspecific intraventricular conduction delay Baseline wander in lead(s) V6 Confirmed by Noemi Chapel [phone]) on 12/09/2021 11:24:52 AM  Radiology US Venous Img Lower Unilateral Right (DVT)  Result Date: 12/09/2021 CLINICAL DATA:  Right knee pain EXAM: RIGHT LOWER EXTREMITY VENOUS DOPPLER ULTRASOUND TECHNIQUE: Gray-scale sonography with compression, as well as color and duplex ultrasound, were performed to evaluate the deep venous system(s) from the level of the common femoral vein through the popliteal and proximal calf veins. COMPARISON:  None. FINDINGS: VENOUS Normal compressibility of the common femoral, superficial femoral, and popliteal veins, as well as the visualized calf veins. Visualized portions of profunda femoral vein and great saphenous vein unremarkable. No filling defects to suggest DVT on grayscale or color Doppler imaging. Doppler waveforms show normal direction of venous flow, normal respiratory plasticity and response to augmentation. Limited views of the contralateral common femoral vein are unremarkable. OTHER None. Limitations: none IMPRESSION: No evidence of right lower extremity DVT. Electronically Signed   By: Yetta Glassman M.D.   On: 12/09/2021 12:10   DG Chest Port 1 View  Result Date: 12/09/2021 CLINICAL DATA:  Chest pain and syncope after working out. EXAM: PORTABLE CHEST 1 VIEW COMPARISON:  Chest x-ray dated April 09, 2021. FINDINGS: The heart size and mediastinal contours are within normal limits. Both lungs are clear. The visualized skeletal structures are unremarkable. IMPRESSION: No active disease. Electronically Signed   By: Titus Dubin  M.D.   On: 12/09/2021 11:38    Procedures Procedures    Medications Ordered in ED Medications  ketorolac (TORADOL) 30 MG/ML injection 30 mg (has no administration in time range)  HYDROcodone-acetaminophen (NORCO/VICODIN) 5-325 MG per tablet 2 tablet (2 tablets Oral Given 12/09/21 1218)  ondansetron (ZOFRAN-ODT) disintegrating tablet 4 mg (4 mg Oral Given 12/09/21 1219)    ED Course/ Medical Decision Making/ A&P                           Medical Decision Making Amount and/or Complexity of Data Reviewed Labs: ordered. Radiology: ordered. ECG/medicine tests: ordered.  Risk Prescription drug management.    This patient presents to the ED for concern of chest pain, this involves an extensive number of treatment options, and is a complaint that carries with it a high risk of complications and morbidity.  The differential diagnosis includes This patient's exam is unremarkable however she has some ongoing chest pain status post a syncopal episode.  By all accounts it sounds like this was more of a vagal episode as she was able to complete her entire aerobics class without any difficulty however we will keep her on a cardiac monitor, get labs and an EKG as well as  a chest x-ray.  The patient is agreeable to the plan.   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   Lab Tests:  I Ordered, and personally interpreted labs.  The pertinent results include: Troponin virtually undetectable at 15,  this is consistent with multiple prior values.  She had a basic metabolic panel showing mild hyperglycemia but no renal dysfunction, normal blood counts, INR of 1.2.   Imaging Studies ordered:  I ordered imaging studies including ultrasound of the leg to rule out DVT and a chest x-ray. I independently visualized and interpreted imaging which showed ultrasound showed no signs of DVT, the x-ray showed no signs of acute pulmonary or cardiac disease, specifically there is no pneumothorax I agree with the radiologist interpretation   Cardiac Monitoring:  The patient was maintained on a cardiac monitor.  I personally viewed and interpreted the cardiac monitored which showed an underlying rhythm of: Atrial fibrillation, rate controlled, no significant ischemia   Medicines ordered and prescription drug management:  I ordered medication including ketorolac, Zofran, hydrocodone for pain for pain Reevaluation of the patient after these medicines showed that the patient improved I have reviewed the patients home medicines and have made adjustments as needed   Test Considered:  CT scan of the chest however the patient is already anticoagulated making pulmonary embolism extremely unlikely.   Critical Interventions:  Evaluation for the source of chest pain including coronary disease, work-up was unremarkable, in fact the patient had no orthostatic changes with standing, no recurrent syncope, no palpitations.  Chest pain was persistent the entire time, no EKG changes of ischemia and negative troponin, patient stable for discharge in the outpatient setting, she is agreeable         Final Clinical Impression(s) / ED Diagnoses Final diagnoses:  Left-sided chest pain  Syncope, unspecified syncope type    Rx / DC Orders ED Discharge Orders          Ordered    naproxen (NAPROSYN) 500 MG tablet  2 times daily with meals        12/09/21 1346              Noemi Chapel, MD 12/09/21  1350

## 2021-12-09 NOTE — Discharge Instructions (Signed)
Your testing today did not reveal any signs of heart attack, it did not reveal any signs of blood clot, your blood work was unremarkable and your EKG was very reassuring.  Are you to continue taking your apixaban as a blood thinner, this will help prevent blood clots.  I would like for you to start taking Naprosyn only twice a day, you should use this for no longer than 1 week to help reduce the inflammation in your chest.  If you should develop severe or worsening symptoms I want you to return to the emergency department immediately otherwise see your doctor within 3 days for recheck ?

## 2021-12-09 NOTE — ED Triage Notes (Signed)
Pt bib ems from [facility] for syncopial episoded after working out.  Reports cp.  Denies sob.  Resp even and unlabored.  No meds by ems.  Hx of chf and afib.  Bgl 147.  Vss by ems. ?

## 2021-12-09 NOTE — ED Notes (Signed)
Patient became light-headed  and pale when she went from laying to sitting position.  This episode lasted for approximately 3-4 minutes.   Did not stand patient up for this reason.    ?

## 2021-12-10 ENCOUNTER — Telehealth: Payer: Self-pay | Admitting: Cardiology

## 2021-12-10 NOTE — Telephone Encounter (Signed)
Reports after gym work out yesterday, she became dizzy at end of class and passed out. Says she did not hit the ground due to bystanders close by but was unconscious for a couple a minutes. Vitals or BS not taken a that time.  ?Reports lightheaded today & some SOB ?Reports active Chest pain rated 7/10. Says the chest pain is continuous ?Reports she has chest pain on and off all the time ?Seen in ED yesterday and diagnosed with lung pleurisy and was told to follow up with her PCP ?Says she wanted Dr Harl Bowie to know what was going on and that she always call to report any symptoms to [facility] ?Unable to check BP at home due to monitor being broken ?Current O2 Sat 98 % & HR 55 ?Reports having a PCP appointment is Monday 12/13/2021 ?Advised for chest pain 7/10 she needed to go to the ED for an evaluation ?Gave first available appointment given to see [facility] on 02/17/22 @11 :78 am [place] office ?Verbalized understanding of plan ?

## 2021-12-10 NOTE — Telephone Encounter (Signed)
We can try sending her to [facility]. She has a history of headaches so we can use that diagnosis. Referral placed.  ? ?Salvatore Marvel, MD ?[facility] ? ?

## 2021-12-10 NOTE — Telephone Encounter (Signed)
Called and informed patient of referral to [facility]  ? ?[address] ?[place] [address] ?Ph: [phone] ? ?Advised patient to reach out to their office if she does not hear from them in 3-5 business days. Patient expressed her gratitude and verbalized understanding.  ? ?Patient also wanted to let Dr. Ernst Bowler know about her visit to the ER yesterday. Patient states she is doing better and has been resting.  ? ? ?

## 2021-12-10 NOTE — Telephone Encounter (Signed)
?  Pt c/o of Chest Pain: STAT if CP now or developed within 24 hours ? ?1. Are you having CP right now? yes ? ?2. Are you experiencing any other symptoms (ex. SOB, nausea, vomiting, sweating)? Has thrown up a couple times, lightheaded  ? ?3. How long have you been experiencing CP? Started yesterday ? ?4. Is your CP continuous or coming and going? continuous ? ?5. Have you taken Nitroglycerin? no ? ?Pt c/o Syncope: STAT if syncope occurred within 30 minutes and pt complains of lightheadedness ?High Priority if episode of passing out, completely, today or in last 24 hours  ? ?Did you pass out today? no ? ?When is the last time you passed out? yesterday ? ?Has this occurred multiple times? no ? ?Did you have any symptoms prior to passing out? Dizziness ? ? ?[facility] [place] [facility]. She says she was sent to the ED and they told her to go home and rest. She says she has also been having chest pain since yesterday. She says she has thrown up a couple times and is not sure if it is from the pain medication she took last night. She says she does not have covid ? ?

## 2021-12-14 NOTE — Telephone Encounter (Signed)
Offered 1st available 01/17/22 w/[place] at [facility] office. Declined and request to be wait to see [facility] only, also added to wait list.  ?

## 2021-12-16 ENCOUNTER — Ambulatory Visit (INDEPENDENT_AMBULATORY_CARE_PROVIDER_SITE_OTHER): Payer: Medicare Other | Admitting: Podiatry

## 2021-12-16 ENCOUNTER — Other Ambulatory Visit: Payer: Self-pay

## 2021-12-16 ENCOUNTER — Telehealth: Payer: Self-pay | Admitting: Cardiology

## 2021-12-16 DIAGNOSIS — S99921D Unspecified injury of right foot, subsequent encounter: Secondary | ICD-10-CM

## 2021-12-16 DIAGNOSIS — M2041 Other hammer toe(s) (acquired), right foot: Secondary | ICD-10-CM | POA: Diagnosis not present

## 2021-12-16 DIAGNOSIS — M2011 Hallux valgus (acquired), right foot: Secondary | ICD-10-CM

## 2021-12-16 MED ORDER — ACETAMINOPHEN-CODEINE #3 300-30 MG PO TABS
1.0000 | ORAL_TABLET | ORAL | 0 refills | Status: AC | PRN
Start: 2021-12-16 — End: 2021-12-21

## 2021-12-16 NOTE — Telephone Encounter (Signed)
Pt is to have foot surgery on 3/24 and needs to be seen by Dr. Harl Bowie prior to surgery. Pt states that she had syncope and chest pain and was seen in the ED. Will route to Dr. Harl Bowie.  ?

## 2021-12-16 NOTE — Telephone Encounter (Signed)
Patient called stating she has surgery scheduled on 3/24 now because of her passing out.  She was recently seen in the emergency room for syncope and chest pain.  She said that a Dr. Domenic Polite will be reaching out to Dr. Harl Bowie about this, but she took it upon herself to give [place] a call. She states she needs to be seen by Dr. Harl Bowie prior to her surgery that is scheduled for 12/31/21. ?

## 2021-12-17 NOTE — Telephone Encounter (Signed)
Laura Shaggy, MD  Laura [name] ?Cc: Laura [name], Laura [name], NT ?Sounds good! Thank you!  ? ?Laura [name]   ?  ?   ?Previous Messages ?  ?----- Message -----  ?From: Laura [name]  ?Sent: 12/14/2021   5:21 PM EST  ?To: Laura Shaggy, MD  ? ?Hi Dr Ernst Bowler,  ? ?Thank you for the referral for [name]. I wanted to reach out because I spoke with the patient and she advised that she is seeing Dr Merlene Laughter for her neuropathy, and Dr Tat for her tremors. She advised that she is wanting to be referred to our office for neuropathy, not migraines like the referral states. After speaking with her further, I advised her that since she is already established with [facility], it would be best for her to be referred back to them with the diagnosis of neuropathy like the patient is requesting. She is amenable to being referred back to them  ? ?Thanks!   ?

## 2021-12-17 NOTE — Telephone Encounter (Signed)
Could see at noon on Monday ? ? ?Laura Abts MD ?

## 2021-12-17 NOTE — Telephone Encounter (Signed)
Offered pt 3/13 12pm appt with Dr. Harl Bowie in the [place] office and she accepted.  ?

## 2021-12-20 ENCOUNTER — Emergency Department (HOSPITAL_COMMUNITY)
Admission: EM | Admit: 2021-12-20 | Discharge: 2021-12-20 | Disposition: A | Discharge status: Home / Self Care | Payer: Medicare Other | Attending: Emergency Medicine | Admitting: Emergency Medicine

## 2021-12-20 ENCOUNTER — Encounter (HOSPITAL_COMMUNITY): Payer: Self-pay

## 2021-12-20 ENCOUNTER — Emergency Department (HOSPITAL_COMMUNITY): Payer: Medicare Other

## 2021-12-20 ENCOUNTER — Ambulatory Visit (INDEPENDENT_AMBULATORY_CARE_PROVIDER_SITE_OTHER): Payer: Medicare Other | Admitting: Cardiology

## 2021-12-20 ENCOUNTER — Encounter: Payer: Self-pay | Admitting: Cardiology

## 2021-12-20 ENCOUNTER — Other Ambulatory Visit: Payer: Self-pay

## 2021-12-20 VITALS — BP 140/82 | HR 76 | Ht 64.0 in | Wt 202.0 lb

## 2021-12-20 DIAGNOSIS — R008 Other abnormalities of heart beat: Secondary | ICD-10-CM | POA: Insufficient documentation

## 2021-12-20 DIAGNOSIS — Z7984 Long term (current) use of oral hypoglycemic drugs: Secondary | ICD-10-CM | POA: Insufficient documentation

## 2021-12-20 DIAGNOSIS — E860 Dehydration: Secondary | ICD-10-CM | POA: Diagnosis not present

## 2021-12-20 DIAGNOSIS — R55 Syncope and collapse: Secondary | ICD-10-CM | POA: Insufficient documentation

## 2021-12-20 DIAGNOSIS — E119 Type 2 diabetes mellitus without complications: Secondary | ICD-10-CM | POA: Insufficient documentation

## 2021-12-20 DIAGNOSIS — Z7901 Long term (current) use of anticoagulants: Secondary | ICD-10-CM | POA: Diagnosis not present

## 2021-12-20 DIAGNOSIS — Z794 Long term (current) use of insulin: Secondary | ICD-10-CM | POA: Insufficient documentation

## 2021-12-20 DIAGNOSIS — R42 Dizziness and giddiness: Secondary | ICD-10-CM | POA: Diagnosis present

## 2021-12-20 DIAGNOSIS — E876 Hypokalemia: Secondary | ICD-10-CM | POA: Insufficient documentation

## 2021-12-20 DIAGNOSIS — I509 Heart failure, unspecified: Secondary | ICD-10-CM | POA: Diagnosis not present

## 2021-12-20 DIAGNOSIS — R519 Headache, unspecified: Secondary | ICD-10-CM

## 2021-12-20 LAB — BASIC METABOLIC PANEL
Anion gap: 13 (ref 5–15)
BUN: 31 mg/dL — ABNORMAL HIGH (ref 6–20)
CO2: 25 mmol/L (ref 22–32)
Calcium: 9.4 mg/dL (ref 8.9–10.3)
Chloride: 104 mmol/L (ref 98–111)
Creatinine, Ser: 0.75 mg/dL (ref 0.44–1.00)
GFR, Estimated: >60 mL/min (ref >60)
Glucose, Bld: 128 mg/dL — ABNORMAL HIGH (ref 70–99)
Potassium: 3.2 mmol/L — ABNORMAL LOW (ref 3.5–5.1)
Sodium: 142 mmol/L (ref 135–145)

## 2021-12-20 LAB — CBC WITH DIFFERENTIAL/PLATELET
Abs Immature Granulocytes: 0.07 10*3/uL (ref 0.00–0.07)
Basophils Absolute: 0 10*3/uL (ref 0.0–0.1)
Basophils Relative: 1 %
Eosinophils Absolute: 0 10*3/uL (ref 0.0–0.5)
Eosinophils Relative: 0 %
HCT: 41.3 % (ref 36.0–46.0)
Hemoglobin: 14.9 g/dL (ref 12.0–15.0)
Immature Granulocytes: 1 %
Lymphocytes Relative: 32 %
Lymphs Abs: 1.7 10*3/uL (ref 0.7–4.0)
MCH: 32.6 pg (ref 26.0–34.0)
MCHC: 36.1 g/dL — ABNORMAL HIGH (ref 30.0–36.0)
MCV: 90.4 fL (ref 80.0–100.0)
Monocytes Absolute: 0.4 10*3/uL (ref 0.1–1.0)
Monocytes Relative: 7 %
Neutro Abs: 3.2 10*3/uL (ref 1.7–7.7)
Neutrophils Relative %: 59 %
Platelets: 208 10*3/uL (ref 150–400)
RBC: 4.57 MIL/uL (ref 3.87–5.11)
RDW: 12.3 % (ref 11.5–15.5)
WBC: 5.4 10*3/uL (ref 4.0–10.5)
nRBC: 0 % (ref 0.0–0.2)

## 2021-12-20 LAB — TROPONIN I (HIGH SENSITIVITY)
Troponin I (High Sensitivity): 14 ng/L (ref <18)
Troponin I (High Sensitivity): 16 ng/L (ref <18)

## 2021-12-20 LAB — MAGNESIUM: Magnesium: 1.8 mg/dL (ref 1.7–2.4)

## 2021-12-20 IMAGING — CT CT HEAD W/O CM
3 of 4 series · 16 of 47 positions shown, 19 images · non-contrast
Comparison: None.

CLINICAL DATA: Headache, new or worsening (Age >= 50y)



[Series 2: head w o · axial · 0.45mm/px · z∈[-42,+103]mm · 10 of 35 slices shown, 13 images]
[im 3/35  brain]
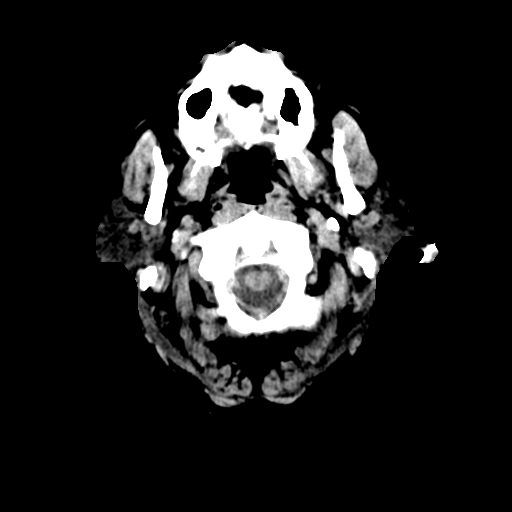
[im 3/35  bone]
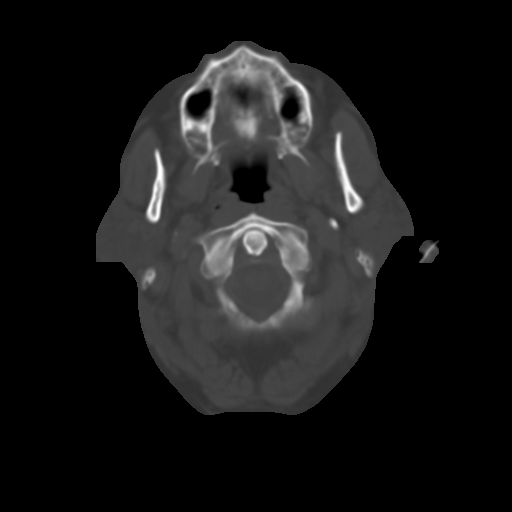
[im 5/35  brain]
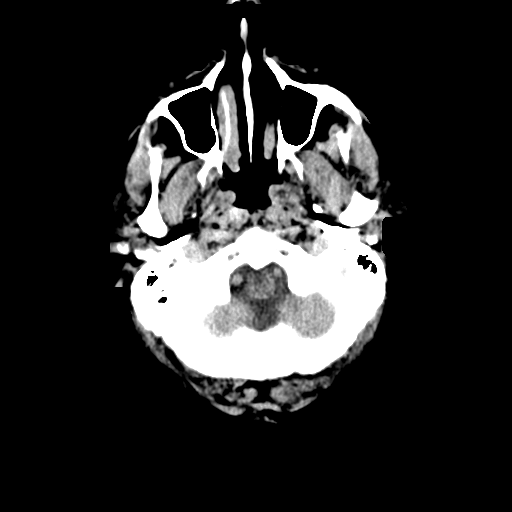
[im 10/35  brain]
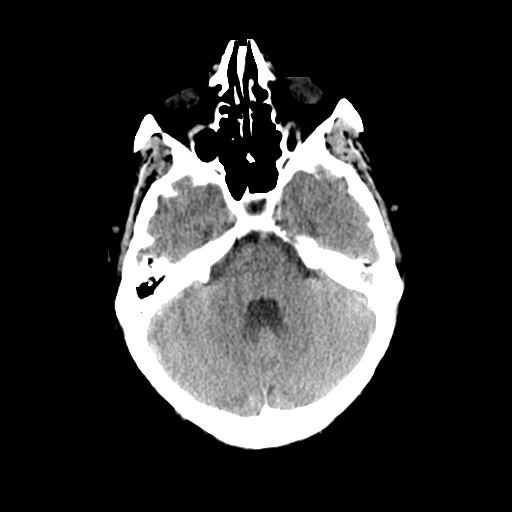
[im 13/35  brain]
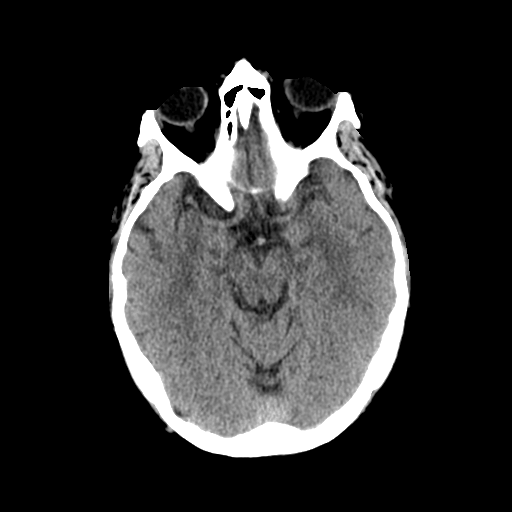
[im 15/35  brain]
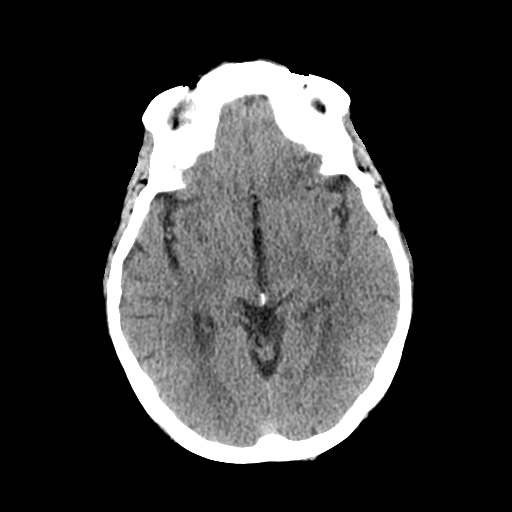
[im 15/35  bone]
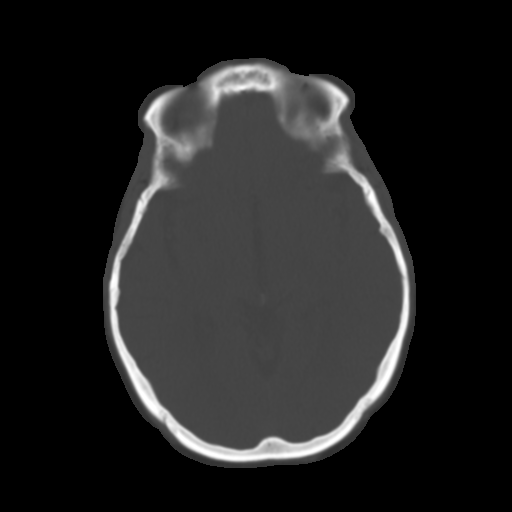
[im 20/35  brain]
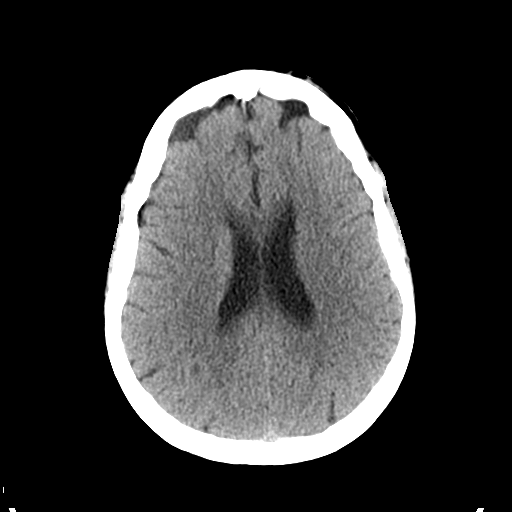
[im 22/35  brain]
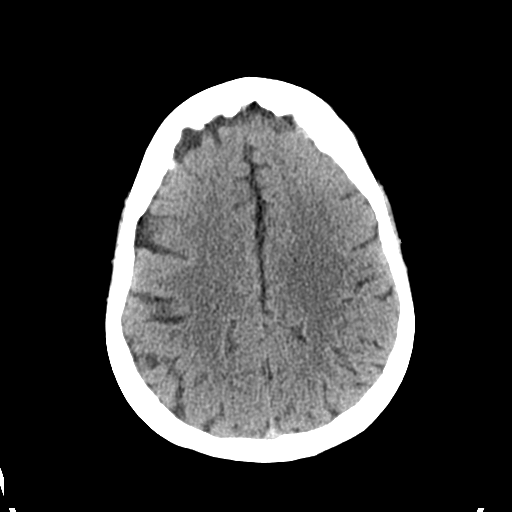
[im 25/35  brain]
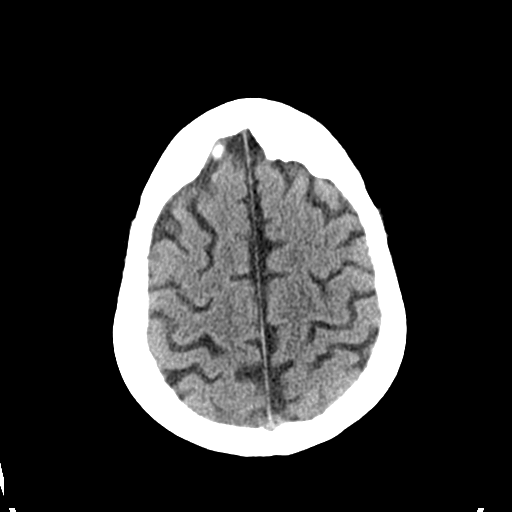
[im 30/35  brain]
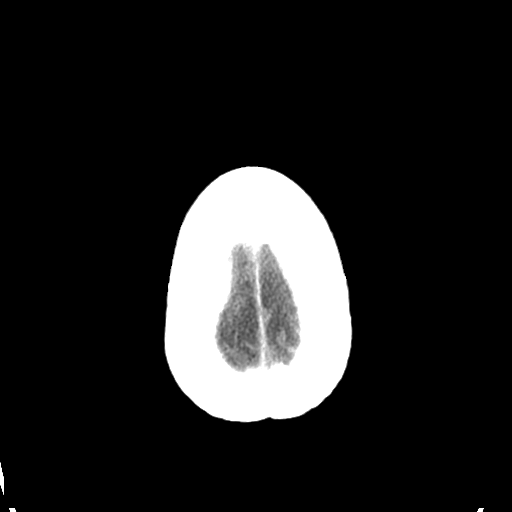
[im 30/35  bone]
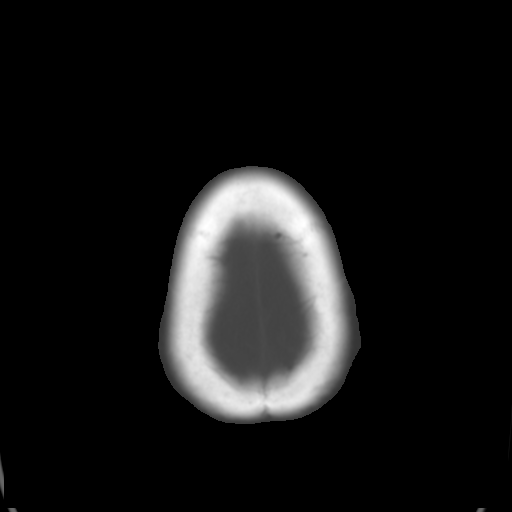
[im 32/35  brain]
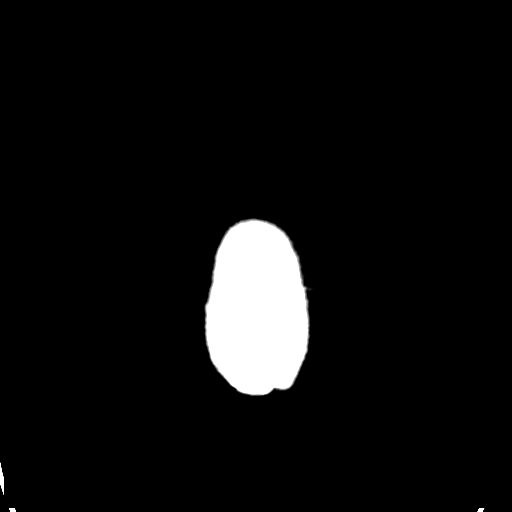

[Series 4: coronal soft · coronal · 0.33mm/px · 3 of 67 slices shown]
[im 23/67  brain]
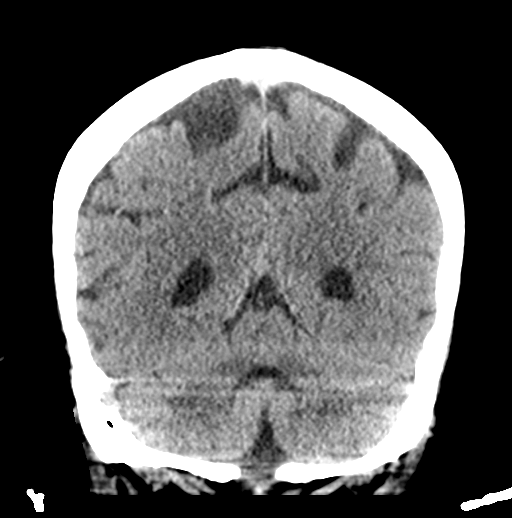
[im 30/67  brain]
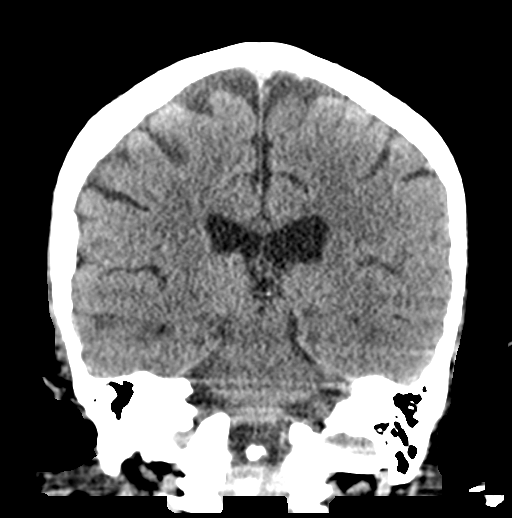
[im 37/67  brain]
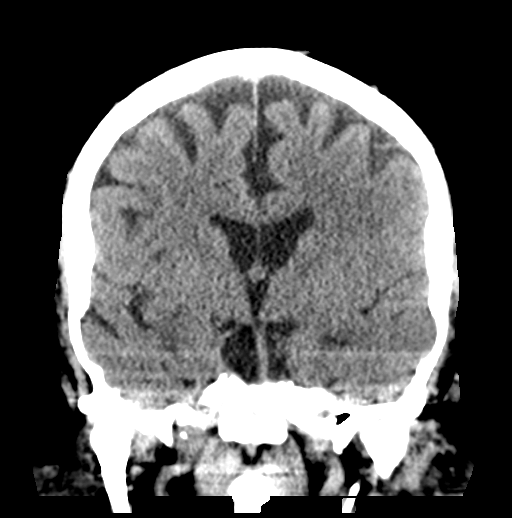

[Series 5: sagittal soft · sagittal · 0.36mm/px · 3 of 53 slices shown]
[im 18/53  brain]
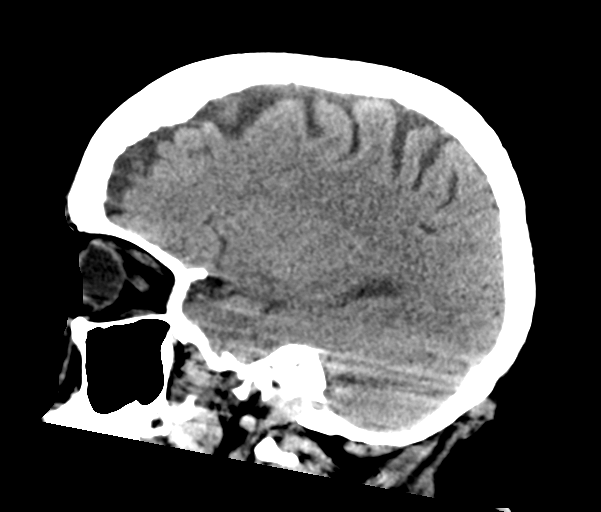
[im 27/53  brain]
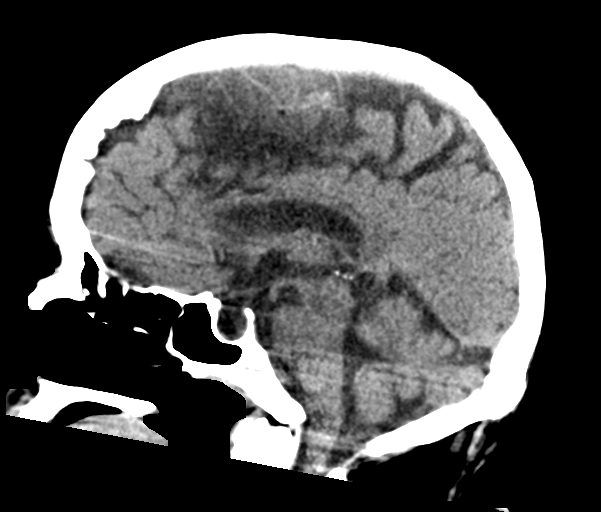
[im 35/53  brain]
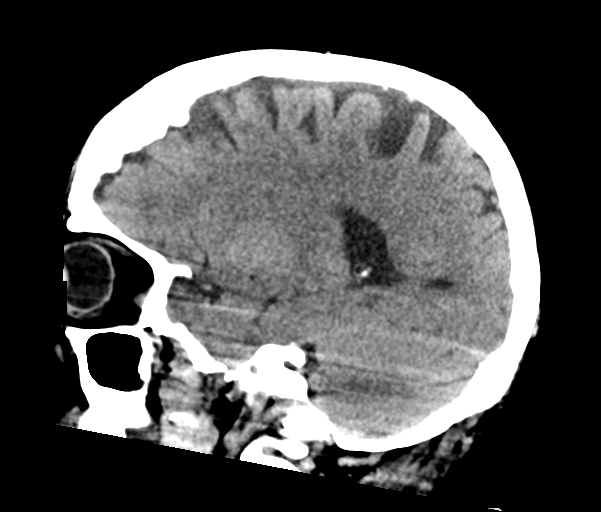

[16 of 47 positions shown; findings below may reference images not displayed]

FINDINGS: Brain: No acute intracranial abnormality. Specifically, no
hemorrhage, hydrocephalus, mass lesion, acute infarction, or
significant intracranial injury.

Vascular: No hyperdense vessel or unexpected calcification.

Skull: No acute calvarial abnormality.

Sinuses/Orbits: No acute findings

Other: None
IMPRESSION: No acute intracranial abnormality.

## 2021-12-20 MED ORDER — HYDROCODONE-ACETAMINOPHEN 5-325 MG PO TABS
1.0000 | ORAL_TABLET | Freq: Once | ORAL | Status: AC
  Administered 2021-12-20: 1 via ORAL
  Filled 2021-12-20: qty 1

## 2021-12-20 MED ORDER — ONDANSETRON HCL 4 MG/2ML IJ SOLN
4.0000 mg | Freq: Once | INTRAMUSCULAR | Status: AC
  Administered 2021-12-20: 4 mg via INTRAVENOUS
  Filled 2021-12-20: qty 2

## 2021-12-20 MED ORDER — SODIUM CHLORIDE 0.9 % IV BOLUS
1000.0000 mL | Freq: Once | INTRAVENOUS | Status: AC
  Administered 2021-12-20: 1000 mL via INTRAVENOUS

## 2021-12-20 MED ORDER — METOCLOPRAMIDE HCL 5 MG/ML IJ SOLN
10.0000 mg | Freq: Once | INTRAMUSCULAR | Status: AC
  Administered 2021-12-20: 10 mg via INTRAVENOUS
  Filled 2021-12-20: qty 2

## 2021-12-20 MED ORDER — POTASSIUM CHLORIDE CRYS ER 20 MEQ PO TBCR
40.0000 meq | EXTENDED_RELEASE_TABLET | Freq: Once | ORAL | Status: AC
  Administered 2021-12-20: 40 meq via ORAL
  Filled 2021-12-20: qty 2

## 2021-12-20 NOTE — Progress Notes (Signed)
Clinical Summary Laura [name] is a 61 y.o.female seen today for follow up of the following medical problems.   Seen today for a focused visit on recent episode of syncope.      1.Chest pain/syncope - ER visit 12/09/21 with chest pain - trop neg x 2. EKG rate controlled afib - BUN 25 Cr 0.76  - CXR no acute process. RLE venous US neg -given toradol  08/2019 cath LV end diastolic pressure is mildly elevated. There is no aortic valve stenosis. Aortic atherosclerosis/calcification noted. No angiographically apparently CAD.   - at the gym was doing silver sneakers workout. Low key aerobic exercise, partially chair based.  - after workout was walking got suddenly dizzy. Lightheaded. Sweaty, then LOC. Out for about 1 minute. Left sided pain chest down left sharp, had been going on prior and chronic symptoms. Fatigued afterward and ever since.  - increased thirst, water 589m x 10 - symptoms typically occur with standing up.  - has had some orthostatic symptoms prior but not syncope   Other medical issues not addressed this visit  1. LE edema/Chronic diastolic HF - 152/5910echo LVEF 60-65%, elevated LVEDP, mild RV dysfunction, mild to mod LAE, - has been on torsemide 659mdaily   - working on weight loss, dieting. Down to 228 lbs. Mainly drinks a healthy drink mix she created on her own.  - some swelling at times, controlled with torsemide overall.      11/2020 echo LVEF 6528-90%indet diastolic, low normal RV function - swelling doing well. Taking torsemide 6032mnd controls edema.  - most recent labs Cr was normal. Home weights 201 lbs       2. PAF - some palpitations at times.  - compliant with meds    - admit 04/17/2021 with afib with RVR in setting of asthma exacerbation - started on dilt gtt, transitioned to oral.  - ongoing palpitations   - last visit we added bisoprolol 2.5mg47mily for ongoign palpitaitons - occasional palpitations though overall mild. Just  finsished course of prednisonse.  - no bleeding on eliquis     3. Prior ASD repair   4. Asthma/Bronchitis - recent admission 04/2021 - followed by Dr AlvaElsworth [place]. COVID + in Jan 2022 - recurrent infection 05/2021      Past Medical History:  Diagnosis Date   Asthma    Atrial fibrillation (HCC)[place] Bipolar disorder (HCC)[place] Bronchiectasis (HCC)    Carpal tunnel syndrome of right wrist    CHF (congestive heart failure) (HCC)    Complication of anesthesia    "[place] I had an allergic reaction to the combination of the pain meds and the anesthesia" she does not remember what happened but "I eneded up in the ICU for 5 days".   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   Type 2 diabetes mellitus (HCC)    Urticaria    Wound infection      Allergies  Allergen Reactions  Ativan [Lorazepam] Hives   Phenergan [Promethazine Hcl] Hives     Current Outpatient Medications  Medication Sig Dispense Refill   acetaminophen (TYLENOL) 325 MG tablet Take 2 tablets (650 mg total) by mouth every 6 (six) hours as needed for mild pain, fever or headache. 30 tablet 1   acetaminophen-codeine (TYLENOL #3) 300-30 MG tablet Take 1 tablet by mouth every 4 (four) hours as needed for up to 5 days for severe pain. 30 tablet 0   albuterol (PROVENTIL) (2.5 MG/3ML) 0.083% nebulizer solution Take 3 mLs (2.5 mg total) by nebulization every 4 (four) hours as needed for wheezing or shortness of breath. 75 mL 1   albuterol (VENTOLIN HFA) 108 (90 Base) MCG/ACT inhaler INHALE 2 PUFFS INTO THE LUNGS EVERY 6 HOURS AS NEEDED FOR WHEEZING OR SHORTNESS OF  BREATH 18 g 1   apixaban (ELIQUIS) 5 MG TABS tablet Take 1 tablet (5 mg total) by mouth 2 (two) times daily. 180 tablet 3   atorvastatin (LIPITOR) 10 MG tablet Take 10 mg by mouth every evening.      BD PEN NEEDLE NANO 2ND GEN 32G X 4 MM MISC 1 each by Other route in the morning, at noon, in the evening, and at bedtime.     Benralizumab (FASENRA PEN) 30 MG/ML SOAJ INJECT 30MG SUBCUTANEOUSLY  EVERY 8 WEEKS 1 mL 6   bisoprolol (ZEBETA) 5 MG tablet Take 0.5 tablets (2.5 mg total) by mouth daily. 45 tablet 3   blood glucose meter kit and supplies 1 each by Other route 4 (four) times daily. Dispense based on patient and insurance preference. Use up to four times daily as directed. (FOR ICD-10 E10.9, E11.9). 1 each 0   Cholecalciferol (VITAMIN D3) 125 MCG (5000 UT) CAPS Take 1 capsule (5,000 Units total) by mouth daily. 90 capsule 0   Continuous Blood Gluc Sensor (FREESTYLE LIBRE 3 SENSOR) MISC 1 Piece by Does not apply route every 14 (fourteen) days. Place 1 sensor on the skin every 14 days. Use to check glucose continuously 6 each 2   diltiazem (CARDIZEM CD) 360 MG 24 hr capsule TAKE 1 CAPSULE(360 MG) BY MOUTH DAILY 90 capsule 3   divalproex (DEPAKOTE ER) 500 MG 24 hr tablet Take 1 tablet (500 mg total) by mouth daily AND 2 tablets (1,000 mg total) at bedtime. 270 tablet 0   Dulaglutide (TRULICITY) 1.5 GM/0.1UU SOPN ADMINISTER 1.5 MG UNDER THE SKIN ONCE A WEEK ON SUNDAY 2 mL 2   ELDERBERRY PO Take 4 g by mouth daily. Gummies 2g each     EPINEPHrine 0.3 mg/0.3 mL IJ SOAJ injection Inject 0.3 mg into the muscle once as needed for anaphylaxis.     ferrous sulfate 324 MG TBEC Take 324 mg by mouth daily with breakfast.     fluticasone furoate-vilanterol (BREO ELLIPTA) 100-25 MCG/ACT AEPB Inhale 1 puff into the lungs daily. 60 each 11   gabapentin (NEURONTIN) 600 MG tablet Take 600 mg by mouth 3 (three) times daily.     hydrALAZINE (APRESOLINE) 25 MG tablet Take 25 mg by mouth 3 (three) times daily.      hydrOXYzine (ATARAX/VISTARIL) 25 MG tablet Take 1 tablet (25 mg total) by mouth every 6 (six) hours as needed for anxiety or nausea. 30 tablet 0   Immune Globulin, Human, 4 GM/20ML SOLN Inject 100 mLs into the skin every 14 (fourteen) days.     insulin glargine (LANTUS SOLOSTAR) 100 UNIT/ML Solostar Pen Inject 40 Units into the skin at bedtime. [place]  mL 1   Insulin Pen Needle (PEN NEEDLES 3/16") 31G X 5 MM MISC Use as directed with insulin pen 100 each 0   levothyroxine (SYNTHROID, LEVOTHROID) 112 MCG tablet Take 112 mcg by mouth daily before breakfast.     linaclotide (LINZESS) 145 MCG CAPS capsule Take 1 capsule (145 mcg total) by mouth daily before breakfast. 90 capsule 3   Melatonin 3 MG TABS Take 3 mg by mouth at bedtime.     metFORMIN (GLUCOPHAGE) 500 MG tablet Take 500 mg by mouth 2 (two) times daily.     metoCLOPramide (REGLAN) 5 MG tablet Take 5 mg by mouth 3 (three) times daily.     montelukast (SINGULAIR) 10 MG tablet Take 1 tablet by mouth daily.     Multiple Vitamins-Minerals (MULTIVITAMIN WITH MINERALS) tablet Take 1 tablet by mouth daily. Woman     naproxen (NAPROSYN) 500 MG tablet Take 1 tablet (500 mg total) by mouth 2 (two) times daily with a meal. 30 tablet 0   Nebulizer MISC Nebulizer tubing kit 2 each 5   omeprazole (PRILOSEC) 20 MG capsule TAKE 1 CAPSULE(20 MG) BY MOUTH TWICE DAILY BEFORE A MEAL 60 capsule 5   ondansetron (ZOFRAN) 4 MG tablet Take 1 tablet (4 mg total) by mouth every 8 (eight) hours as needed for nausea or vomiting. 20 tablet 0   ONETOUCH ULTRA test strip 1 each by Other route in the morning, at noon, in the evening, and at bedtime.     polyethylene glycol (MIRALAX / GLYCOLAX) 17 g packet Take 17 g by mouth daily as needed. 14 each 0   Potassium Chloride ER 20 MEQ TBCR Take 20 mEq by mouth daily.     Tiotropium Bromide Monohydrate (SPIRIVA RESPIMAT) 1.25 MCG/ACT AERS Inhale 2 puffs into the lungs daily. 4 g 1   topiramate (TOPAMAX) 100 MG tablet Take 1 tablet  (100 mg total) by mouth 2 (two) times daily. 180 tablet 2   torsemide (DEMADEX) 20 MG tablet Take 60 mg by mouth daily.     triamcinolone cream (KENALOG) 0.1 % Apply 1 application topically 2 (two) times daily.     vitamin B-12 (CYANOCOBALAMIN) 1000 MCG tablet Take 1,000 mcg by mouth daily.     vitamin C (ASCORBIC ACID) 500 MG tablet Take 500 mg by mouth daily. Power C immune support     No current facility-administered medications for this visit.     Past Surgical History:  Procedure Laterality Date   ASD REPAIR  1968   BIOPSY  01/26/2021   Procedure: BIOPSY;  Surgeon: [name], DO;  [facility];  Service: Endoscopy;;  gastric   CARDIAC SURGERY     CARDIOVERSION     CARDIOVERSION N/A 08/29/2018   Procedure: CARDIOVERSION;  Surgeon: Arnoldo Lenis, MD;  Location: [facility];  Service: Endoscopy;  Laterality: N/A;   [place]   COLONOSCOPY WITH PROPOFOL N/A 01/26/2021   Procedure: COLONOSCOPY WITH PROPOFOL;  Surgeon: [name], DO;  [facility]: [facility];  Service: Endoscopy;  Laterality: N/A;  am appt, diabetic   ESOPHAGOGASTRODUODENOSCOPY (EGD) WITH PROPOFOL N/A 01/26/2021   Procedure: ESOPHAGOGASTRODUODENOSCOPY (EGD) WITH PROPOFOL;  Surgeon: [name], DO;  [facility];  Service: Endoscopy;  Laterality: N/A;   LEFT HEART CATH AND CORONARY ANGIOGRAPHY N/A 08/30/2019   Procedure: LEFT HEART CATH AND CORONARY ANGIOGRAPHY;  Surgeon: [name], MD;  Location: [facility];  Service: Cardiovascular;  Laterality: N/A;   NASAL SINUS SURGERY  2017   POLYPECTOMY  01/26/2021   Procedure: POLYPECTOMY;  Surgeon: [name], DO;  [facility]: [facility];  Service: Endoscopy;;  colon     Allergies  Allergen Reactions   Ativan [Lorazepam] Hives   Phenergan [Promethazine Hcl] Hives      Family History  Problem Relation Age of Onset   Hypertension Mother    Stroke Mother    Kidney  disease Mother    Heart attack Mother    Alcohol abuse Mother    Other Father        ?colon cancer   Kidney disease Sister    Heart disease Brother    Stroke Maternal Aunt    Heart disease Maternal Aunt    Hypertension Sister    Bipolar disorder Sister    Thyroid disease Sister    Prostate cancer Brother    Thyroid disease Daughter    Hashimoto's thyroiditis Daughter    Celiac disease Daughter    Allergic rhinitis Daughter    Asthma Neg Hx      Social History Laura [name] reports that she quit smoking about 15 years ago. Her smoking use included cigarettes. She started smoking about 43 years ago. She has a 28.00 pack-year smoking history. She has never used smokeless tobacco. Laura [name] reports that she does not currently use alcohol.   Review of Systems CONSTITUTIONAL: No weight loss, fever, chills, weakness or fatigue.  HEENT: Eyes: No visual loss, blurred vision, double vision or yellow sclerae.No hearing loss, sneezing, congestion, runny nose or sore throat.  SKIN: No rash or itching.  CARDIOVASCULAR: per hpi RESPIRATORY: No shortness of breath, cough or sputum.  GASTROINTESTINAL: No anorexia, nausea, vomiting or diarrhea. No abdominal pain or blood.  GENITOURINARY: No burning on urination, no polyuria NEUROLOGICAL:per hpi MUSCULOSKELETAL: No muscle, back pain, joint pain or stiffness.  LYMPHATICS: No enlarged nodes. No history of splenectomy.  PSYCHIATRIC: No history of depression or anxiety.  ENDOCRINOLOGIC: No reports of sweating, cold or heat intolerance. No polyuria or polydipsia.  Marland Kitchen   Physical Examination Today's Vitals   12/20/21 1152  BP: 140/82  Pulse: 76  SpO2: 98%  Weight: 202 lb (91.6 kg)  Height: _0  (1.626 m)   Body mass index is 34.67 kg/m.  Gen: resting comfortably, no acute distress HEENT: no scleral icterus, pupils equal round and reactive, no palptable cervical adenopathy,  CV: irreg, no m//r/g, no jvd Resp: Clear to auscultation  bilaterally GI: abdomen is soft, non-tender, non-distended, normal bowel sounds, no hepatosplenomegaly MSK: extremities are warm, no edema.  Skin: warm, no rash Neuro:  no focal deficits Psych: appropriate affect   Diagnostic Studies  08/2019 cath LV end diastolic pressure is mildly elevated. There is no aortic valve stenosis. Aortic atherosclerosis/calcification noted. No angiographically apparently CAD.     08/2019 echo 1. Left ventricular ejection fraction, by visual estimation, is 60 to  65%. The left ventricle has normal function. There is mildly increased  left ventricular hypertrophy.   2. Elevated left ventricular end-diastolic pressure.   3. Left ventricular diastolic function could not be evaluated.   4. Global right ventricle has mildly reduced systolic function.The right  ventricular size is normal. No increase in right ventricular wall  thickness.   5. Left atrial size was mild-moderately dilated.   6. Right atrial size was normal.   7. Moderate to severe mitral annular calcification.   8. The mitral valve is degenerative. Trace mitral valve  regurgitation.   9. The tricuspid valve is grossly normal. Tricuspid valve regurgitation  is trivial.  10. The aortic valve is tricuspid. Aortic valve regurgitation is not  visualized. Mild aortic valve sclerosis without stenosis.  11. The pulmonic valve was grossly normal. Pulmonic valve regurgitation is  not visualized.  12. The inferior vena cava is normal in size with greater than 50%  respiratory variability, suggesting right atrial pressure of 3 mmHg.   Assessment and Plan   1. Orthostatic syncope - recent orthostatic syncope with prior ER evaluation - 2 near episodes of syncope here in clinic just with lying to sitting. Orthostatics show SBP drops 12 points with sitting, unable to get while standing due to symptoms - unable to get her off exam table without symptoms. She drover herself today. Due to significant  symptoms we have called EMS to transport her to ER. Will need IVFs, would d/c her torsemide x 3 days then resume 78m daily with 241mprn from there. - ekg today shows afi, rate controlled.       JoArnoldo LenisM.D

## 2021-12-20 NOTE — ED Provider Notes (Incomplete)
[facility] Provider Note   CSN: [id number] Arrival date & time: 12/20/21  1315     History {Add pertinent medical, surgical, social history, OB history to HPI:1} Chief Complaint  Patient presents with   Hypotension    Dail Meece is a 61 y.o. female.  She has multiple medical conditions including diabetes CHF.  Was seen here a little over a week ago for a syncopal event after exercising.  Was following up with cardiology today when found to be dizzy lightheaded near syncopal.  Concerned that she was dehydrated.  Sent by ambulance over here for some IV hydration.  Patient endorses blurry vision feeling lightheaded left-sided chest pain that is been ongoing since her syncopal event.  Also endorses headache.  She feels she is well-hydrated.  No abdominal pain vomiting diarrhea or urinary symptoms.  The history is provided by the patient.  Dizziness Quality:  Lightheadedness Severity:  Severe Onset quality:  Sudden Duration:  11 days Timing:  Sporadic Progression:  Unchanged Chronicity:  New Relieved by:  Nothing Worsened by:  Standing up Ineffective treatments:  None tried Associated symptoms: chest pain, headaches and vision changes   Associated symptoms: no diarrhea and no shortness of breath       Home Medications Prior to Admission medications   Medication Sig Start Date End Date Taking? Authorizing Provider  acetaminophen (TYLENOL) 325 MG tablet Take 2 tablets (650 mg total) by mouth every 6 (six) hours as needed for mild pain, fever or headache. 08/11/18   Roxan Hockey, MD  acetaminophen-codeine (TYLENOL #3) 300-30 MG tablet Take 1 tablet by mouth every 4 (four) hours as needed for up to 5 days for severe pain. 12/16/21 12/21/21  McDonald, Stephan Minister, DPM  albuterol (PROVENTIL) (2.5 MG/3ML) 0.083% nebulizer solution Take 3 mLs (2.5 mg total) by nebulization every 4 (four) hours as needed for wheezing or shortness of breath. 04/07/21   Althea Charon, FNP   albuterol (VENTOLIN HFA) 108 (90 Base) MCG/ACT inhaler INHALE 2 PUFFS INTO THE LUNGS EVERY 6 HOURS AS NEEDED FOR WHEEZING OR SHORTNESS OF BREATH 04/07/21   Althea Charon, FNP  apixaban (ELIQUIS) 5 MG TABS tablet Take 1 tablet (5 mg total) by mouth 2 (two) times daily. 07/14/21   Arnoldo Lenis, MD  atorvastatin (LIPITOR) 10 MG tablet Take 10 mg by mouth every evening.     [provider]  BD PEN NEEDLE NANO 2ND GEN 32G X 4 MM MISC 1 each by Other route in the morning, at noon, in the evening, and at bedtime. 05/20/20   [provider]  Benralizumab [facility] PEN) 30 MG/ML SOAJ INJECT $RemoveBef'30MG'PLdSgKkZcy$  SUBCUTANEOUSLY  EVERY 8 WEEKS 12/06/21   Valentina Shaggy, MD  bisoprolol (ZEBETA) 5 MG tablet Take 0.5 tablets (2.5 mg total) by mouth daily. 07/14/21   Arnoldo Lenis, MD  blood glucose meter kit and supplies 1 each by Other route 4 (four) times daily. Dispense based on patient and insurance preference. Use up to four times daily as directed. (FOR ICD-10 E10.9, E11.9). 01/27/21   Cassandria Anger, MD  Cholecalciferol (VITAMIN D3) 125 MCG (5000 UT) CAPS Take 1 capsule (5,000 Units total) by mouth daily. 11/25/20   Cassandria Anger, MD  Continuous Blood Gluc Sensor (FREESTYLE LIBRE 3 SENSOR) MISC 1 Piece by Does not apply route every 14 (fourteen) days. Place 1 sensor on the skin every 14 days. Use to check glucose continuously 10/28/21   Cassandria Anger, MD  diltiazem (CARDIZEM  CD) 360 MG 24 hr capsule TAKE 1 CAPSULE(360 MG) BY MOUTH DAILY 03/01/21   Arnoldo Lenis, MD  divalproex (DEPAKOTE ER) 500 MG 24 hr tablet Take 1 tablet (500 mg total) by mouth daily AND 2 tablets (1,000 mg total) at bedtime. 10/27/21 01/25/22  [name], MD  Dulaglutide (TRULICITY) 1.5 PX/1.0GY SOPN ADMINISTER 1.5 MG UNDER THE SKIN ONCE A WEEK ON SUNDAY 10/28/21   Cassandria Anger, MD  ELDERBERRY PO Take 4 g by mouth daily. Gummies 2g each    [provider]  EPINEPHrine 0.3 mg/0.3 mL  IJ SOAJ injection Inject 0.3 mg into the muscle once as needed for anaphylaxis. 01/17/20   [provider]  ferrous sulfate 324 MG TBEC Take 324 mg by mouth daily with breakfast.    [provider]  fluticasone furoate-vilanterol (BREO ELLIPTA) 100-25 MCG/ACT AEPB Inhale 1 puff into the lungs daily. 10/27/21   Rigoberto Noel, MD  gabapentin (NEURONTIN) 600 MG tablet Take 600 mg by mouth 3 (three) times daily. 07/20/21   [provider]  hydrALAZINE (APRESOLINE) 25 MG tablet Take 25 mg by mouth 3 (three) times daily.    [provider]  hydrOXYzine (ATARAX/VISTARIL) 25 MG tablet Take 1 tablet (25 mg total) by mouth every 6 (six) hours as needed for anxiety or nausea. 08/11/18   Roxan Hockey, MD  Immune Globulin, Human, 4 GM/20ML SOLN Inject 100 mLs into the skin every 14 (fourteen) days.    [provider]  insulin glargine (LANTUS SOLOSTAR) 100 UNIT/ML Solostar Pen Inject 40 Units into the skin at bedtime. 10/28/21   Cassandria Anger, MD  Insulin Pen Needle (PEN NEEDLES 3/16") 31G X 5 MM MISC Use as directed with insulin pen 10/21/21   Cassandria Anger, MD  levothyroxine (SYNTHROID, LEVOTHROID) 112 MCG tablet Take 112 mcg by mouth daily before breakfast.    [provider]  linaclotide Rolan Lipa) 145 MCG CAPS capsule Take 1 capsule (145 mcg total) by mouth daily before breakfast. 06/07/21   Mahala Menghini, PA-C  Melatonin 3 MG TABS Take 3 mg by mouth at bedtime.    [provider]  metFORMIN (GLUCOPHAGE) 500 MG tablet Take 500 mg by mouth 2 (two) times daily. 07/21/21   [provider]  metoCLOPramide (REGLAN) 5 MG tablet Take 5 mg by mouth 3 (three) times daily. 04/05/21   [provider]  montelukast (SINGULAIR) 10 MG tablet Take 1 tablet by mouth daily. 11/23/20   [provider]  Multiple Vitamins-Minerals (MULTIVITAMIN WITH MINERALS) tablet Take 1 tablet by mouth daily. Woman    [provider]   naproxen (NAPROSYN) 500 MG tablet Take 1 tablet (500 mg total) by mouth 2 (two) times daily with a meal. 12/09/21   Noemi Chapel, MD  Nebulizer MISC Nebulizer tubing kit 10/21/20   Valentina Shaggy, MD  omeprazole (PRILOSEC) 20 MG capsule TAKE 1 CAPSULE(20 MG) BY MOUTH TWICE DAILY BEFORE A MEAL 04/28/21   Aliene Altes S, PA-C  ondansetron (ZOFRAN) 4 MG tablet Take 1 tablet (4 mg total) by mouth every 8 (eight) hours as needed for nausea or vomiting. 07/13/19   Hassell Done Mary-Margaret, FNP  [facility] ULTRA test strip 1 each by Other route in the morning, at noon, in the evening, and at bedtime. 10/17/19   [provider]  polyethylene glycol (MIRALAX / GLYCOLAX) 17 g packet Take 17 g by mouth daily as needed. 04/17/21   [place] Donnamarie Poag, DO  Potassium Chloride ER  20 MEQ TBCR Take 20 mEq by mouth daily. 01/04/21   [provider]  Tiotropium Bromide Monohydrate (SPIRIVA RESPIMAT) 1.25 MCG/ACT AERS Inhale 2 puffs into the lungs daily. Patient not taking: Reported on 12/20/2021 04/08/21   Althea Charon, FNP  topiramate (TOPAMAX) 100 MG tablet Take 1 tablet (100 mg total) by mouth 2 (two) times daily. 08/05/21   Tat, Eustace Quail, DO  torsemide (DEMADEX) 20 MG tablet Take 60 mg by mouth daily.    [provider]  triamcinolone cream (KENALOG) 0.1 % Apply 1 application topically 2 (two) times daily. 03/15/21   [provider]  vitamin B-12 (CYANOCOBALAMIN) 1000 MCG tablet Take 1,000 mcg by mouth daily.    [provider]  vitamin C (ASCORBIC ACID) 500 MG tablet Take 500 mg by mouth daily. Power C immune support    [provider]      Allergies    Ativan [lorazepam] and Phenergan [promethazine hcl]    Review of Systems   Review of Systems  Constitutional:  Negative for fever.  HENT:  Negative for sore throat.   Eyes:  Positive for visual disturbance.  Respiratory:  Negative for shortness of breath.   Cardiovascular:  Positive for chest pain.   Gastrointestinal:  Negative for abdominal pain and diarrhea.  Genitourinary:  Negative for dysuria.  Musculoskeletal:  Negative for neck pain.  Skin:  Negative for rash.  Neurological:  Positive for dizziness and headaches.   Physical Exam Updated Vital Signs BP (!) 206/109    Pulse 85    Temp 98.5 F (36.9 C) (Oral)    Resp 18    Ht $R'5\' 4"'kQ$  (1.626 m)    Wt 90.7 kg    SpO2 99%    BMI 34.33 kg/m  Physical Exam Vitals and nursing note reviewed.  Constitutional:      General: She is not in acute distress.    Appearance: Normal appearance. She is well-developed.  HENT:     Head: Normocephalic and atraumatic.  Eyes:     Conjunctiva/sclera: Conjunctivae normal.  Cardiovascular:     Rate and Rhythm: Normal rate. Rhythm irregular.     Heart sounds: No murmur heard. Pulmonary:     Effort: Pulmonary effort is normal. No respiratory distress.     Breath sounds: Normal breath sounds.  Abdominal:     Palpations: Abdomen is soft.     Tenderness: There is no abdominal tenderness.  Musculoskeletal:        General: No swelling.     Cervical back: Neck supple.     Right lower leg: No edema.     Left lower leg: No edema.  Skin:    General: Skin is warm and dry.     Capillary Refill: Capillary refill takes less than 2 seconds.  Neurological:     General: No focal deficit present.     Mental Status: She is alert and oriented to person, place, and time.     Cranial Nerves: No cranial nerve deficit.     Sensory: No sensory deficit.     Motor: No weakness.    ED Results / Procedures / Treatments   Labs (all labs ordered are listed, but only abnormal results are displayed) Labs Reviewed  CBC WITH DIFFERENTIAL/PLATELET  BASIC METABOLIC PANEL  TROPONIN I (HIGH SENSITIVITY)    EKG EKG Interpretation  Date/Time:  Monday December 20 2021 14:27:43 EDT Ventricular Rate:  71 PR Interval:    QRS Duration: 123 QT Interval:  475  QTC Calculation: 517 R Axis:   80 Text Interpretation: Atrial  flutter with predominant 3:1 AV block Nonspecific intraventricular conduction delay No significant change since last tracing Confirmed by Aletta Edouard [phone]) on 12/20/2021 3:04:48 PM  Radiology No results found.  Procedures Procedures  {Document cardiac monitor, telemetry assessment procedure when appropriate:1}  Medications Ordered in ED Medications  HYDROcodone-acetaminophen (NORCO/VICODIN) 5-325 MG per tablet 1 tablet (has no administration in time range)  sodium chloride 0.9 % bolus 1,000 mL (has no administration in time range)    ED Course/ Medical Decision Making/ A&P                           Medical Decision Making Amount and/or Complexity of Data Reviewed Labs: ordered.  Risk Prescription drug management.  This patient complains of ***; this involves an extensive number of treatment Options and is a complaint that carries with it a high risk of complications and morbidity. The differential includes ***  I ordered, reviewed and interpreted labs, which included *** I ordered medication *** and reviewed PMP when indicated. I ordered imaging studies which included *** and I independently    visualized and interpreted imaging which showed *** Additional history obtained from *** Previous records obtained and reviewed *** I consulted *** and discussed lab and imaging findings and discussed disposition.  Cardiac monitoring reviewed, *** Social determinants considered, *** Critical Interventions: ***  After the interventions stated above, I reevaluated the patient and found *** Admission and further testing considered, ***    {Document critical care time when appropriate:1} {Document review of labs and clinical decision tools ie heart score, Chads2Vasc2 etc:1}  {Document your independent review of radiology images, and any outside records:1} {Document your discussion with family members, caretakers, and with consultants:1} {Document social determinants of health  affecting pt's care:1} {Document your decision making why or why not admission, treatments were needed:1} Final Clinical Impression(s) / ED Diagnoses Final diagnoses:  None    Rx / DC Orders ED Discharge Orders     None

## 2021-12-20 NOTE — ED Triage Notes (Signed)
Patient states at this time that she is having chest pain. ?

## 2021-12-20 NOTE — Discharge Instructions (Addendum)
You are seen in the emergency department for evaluation of dizziness headache lightheadedness dehydration.  Your lab work showed your potassium to be mildly low.  Dr. Harl Bowie wanted you to stop your torsemide for 3 days and then start back at just 20 mg a day with an extra 20 as needed.  Please follow-up with your primary care doctor for repeat blood work.  Return to the emergency department if any worsening or concerning symptoms ?

## 2021-12-20 NOTE — Patient Instructions (Signed)
To [facility] ED via 911 for severe orthostasis,near syncope ?

## 2021-12-20 NOTE — ED Triage Notes (Signed)
Patient was at Cardiologist office when they completed orthostatic BP's and noted a 12 point drop in SBP. Patient extremely anxious on arrival and states that she does not want to pass out like she did one week ago.  ?

## 2021-12-20 NOTE — ED Notes (Signed)
Patient requesting to use bathroom immediately upon arrival. Patient unable to stand up, placed on bedpan. Patient missed pan, saturated bed and clothing with urine. Linen changed, patient placed in gown. Upset that she has been placed in hallway. Patient states that she has chest pain all the time which is why she was at cardiologist office this am.   ?

## 2021-12-22 ENCOUNTER — Telehealth: Payer: Self-pay | Admitting: Cardiology

## 2021-12-22 NOTE — Telephone Encounter (Signed)
Pt c/o swelling: STAT is pt has developed SOB within 24 hours ? ?If swelling, where is the swelling located? No specific area is swollen. Patient just says she "feels puffy"  ? ?How much weight have you gained and in what time span? 5 lbs in 2 days  ? ?Have you gained 3 pounds in a day or 5 pounds in a week? 5 lbs  ? ?Do you have a log of your daily weights (if so, list)?  ? ?Are you currently taking a fluid pill? No. Patient was taken off the torsemide two days ago  ? ?Are you currently SOB? Patient is always SOB due to her Asthma  ? ?Have you traveled recently?  ?

## 2021-12-23 ENCOUNTER — Encounter: Payer: Medicare Other | Admitting: Podiatry

## 2021-12-23 NOTE — Telephone Encounter (Signed)
States that she saw her pcp yesterday and weighed 207 ?Weight this morning was 209 ?States that she weighs roughly around the same times in the morning with no clothes on. ?Has appointment with Dr. Nevada Crane in the morning to discuss medication adjustments and the dizzy spells. ?Is supposed to start back on torsemide tomorrow, should she start back sooner? ?Please advise ?

## 2021-12-23 NOTE — Telephone Encounter (Signed)
Pt c/o swelling: STAT is pt has developed SOB within 24 hours ? ?How much weight have you gained and in what time span?  ?Patient gained an additional 2 lbs since yesterday  ? ?If swelling, where is the swelling located?  ?Pt denies having any significant swelling, she states she assumes it is around her heart  ? ?Are you currently taking a fluid pill?  ?No  ? ?Are you currently SOB?  ?No  ? ?Do you have a log of your daily weights (if so, list)?  ? ?Have you gained 3 pounds in a day or 5 pounds in a week?  ?Patient has now gained a total of 7 lbs since Monday, 3/13 ? ?Have you traveled recently?  ?No  ? ?

## 2021-12-26 NOTE — Progress Notes (Signed)
?  Subjective:  ?Patient ID: Laura [name], female    DOB: 1960-11-09,  MRN: [id number] ? ?Chief Complaint  ?Patient presents with  ? Foot Pain  ? ? ?61 y.o. female presents with the above complaint. History confirmed with patient. She is referred to me for bunion surgery consult, she had been scheduled for bunion surgery tomorrow with Dr. Milinda Pointer at the surgery center but this was canceled due to concerns over her cardiac comorbidities.  She is obviously and understandably very frustrated with having her surgery canceled on such short notice.  The right foot has been quite painful for some time and she would very much like to have her surgery completed. ? ?Objective:  ?Physical Exam: ?warm, good capillary refill, no trophic changes or ulcerative lesions, normal DP and PT pulses, normal sensory exam, and on her right foot she has a hallux valgus deformity with a medial bunion and a reducible second hammertoe. ? ?Radiographs: ?Multiple views x-ray of the right foot: Prior right foot radiographs reviewed I agree with the plan of bunionectomy second toe correction and Weil osteotomy, I think likely she needs a plantar plate repair as well ?Assessment:  ? ?1. Injury of plantar plate of right foot, subsequent encounter   ?2. Hammer toe of right foot   ?3. Hallux valgus of right foot   ? ? ? ?Plan:  ?Patient was evaluated and treated and all questions answered. ? ?I reviewed the previously taken radiographs and surgical plan from Dr. Milinda Pointer which I agree with.  I discussed with her that I understand that she is quite frustrated but the cancellation of her surgery was out of an abundance of caution.  To her point she is frustrated this happen with such short notice she felt like she was clear about this previously that she had had cardiac issues as well as issues with anesthesia.  I discussed with her that on 12/09/2021 she had a syncopal episode as well.  She says she was at the gym and felt very lightheaded and had a brief  loss of consciousness was helped into a chair.  I discussed with her that this could be dangerous with a fall especially in the postop setting with her on Eliquis.  She is scheduled to see her cardiologist later this week Dr. Harl Bowie.  I have messaged Dr. Harl Bowie discussed with him her recent cardiac issues if these are stable enough to proceed with surgery and when she has her clearance updated we can proceed with surgical planning, potentially in a few weeks.  I did prescribe her Tylenol with codeine for pain relief she is unable to take NSAIDs.  Well understands she is frustrated with the change of plans as well as the delay in surgery with the patient that is potentially high risk such as her this is in her best interest.  I will discuss further with Dr. Harl Bowie ? ?No follow-ups on file.  ? ?

## 2021-12-26 NOTE — Telephone Encounter (Signed)
Would anticipate some appropriate weight gain given that she was so dehydrated. I would wait for pcp evaluation before deciding on diuretic changes ? ? ?Zandra Abts MD ?

## 2021-12-27 ENCOUNTER — Encounter: Payer: Self-pay | Admitting: Podiatry

## 2021-12-27 ENCOUNTER — Ambulatory Visit (INDEPENDENT_AMBULATORY_CARE_PROVIDER_SITE_OTHER): Payer: Medicare Other | Admitting: Cardiology

## 2021-12-27 ENCOUNTER — Other Ambulatory Visit: Payer: Self-pay | Admitting: Cardiology

## 2021-12-27 ENCOUNTER — Ambulatory Visit (INDEPENDENT_AMBULATORY_CARE_PROVIDER_SITE_OTHER): Payer: Medicare Other

## 2021-12-27 ENCOUNTER — Encounter: Payer: Self-pay | Admitting: Cardiology

## 2021-12-27 ENCOUNTER — Telehealth: Payer: Self-pay | Admitting: Cardiology

## 2021-12-27 ENCOUNTER — Other Ambulatory Visit: Payer: Self-pay

## 2021-12-27 VITALS — BP 153/80 | HR 71 | Ht 64.0 in | Wt 205.6 lb

## 2021-12-27 DIAGNOSIS — J4550 Severe persistent asthma, uncomplicated: Secondary | ICD-10-CM | POA: Diagnosis not present

## 2021-12-27 DIAGNOSIS — R002 Palpitations: Secondary | ICD-10-CM

## 2021-12-27 DIAGNOSIS — I4891 Unspecified atrial fibrillation: Secondary | ICD-10-CM

## 2021-12-27 DIAGNOSIS — R55 Syncope and collapse: Secondary | ICD-10-CM | POA: Diagnosis not present

## 2021-12-27 MED ORDER — TORSEMIDE 20 MG PO TABS: 20.0000 mg | ORAL_TABLET | ORAL | 3 refills | Status: DC

## 2021-12-27 MED ORDER — BENRALIZUMAB 30 MG/ML ~~LOC~~ SOSY
30.0000 mg | PREFILLED_SYRINGE | SUBCUTANEOUS | Status: DC
  Administered 2021-12-27: 30 mg via SUBCUTANEOUS

## 2021-12-27 NOTE — Progress Notes (Signed)
? ? ? ?Clinical Summary ?Laura [name] is a 61 y.o.female seen today for follow up of the following medical problems.  ?  ?Seen today for a focused visit on recent episode of syncope with recent ER visit ?  ?  ?1.Chest pain/syncope ?- ER visit 12/09/21 with chest pain ?- trop neg x 2. EKG rate controlled afib ?- BUN 25 Cr 0.76  ?- CXR no acute process. RLE venous US neg ?-given toradol ?  ?08/2019 cath ?LV end diastolic pressure is mildly elevated. ?There is no aortic valve stenosis. ?Aortic atherosclerosis/calcification noted. ?No angiographically apparently CAD. ?  ?  ?- at the gym was doing silver sneakers workout. Low key aerobic exercise, partially chair based.  ?- after workout was walking got suddenly dizzy. Lightheaded. Sweaty, then LOC. Out for about 1 minute. Left sided pain chest down left sharp, had been going on prior and chronic symptoms. Fatigued afterward and ever since.  ?- increased thirst, water 525mL x 10 ?- symptoms typically occur with standing up.  ?- has had some orthostatic symptoms prior but not syncope ?- had been on torsemide $RemoveBefo'60mg'fjPZvBNcjmk$  daily.  ?- was so orthostatic in clinic could not get off exam table, had to send by ambulance to ER.  ?  ? ?12/2021 ER visit ?- K 3.2 BUN 31 Cr 0.75 Mg 1.8  ?- CT head no acute issues ? EKG rate controlled afib ?- given IVFs, KCl ? ? ?- orthostatics today DBP drops 13 points with standing, HR increases 67-->90 ?- down to torsemide $RemoveBefo'20mg'ZTCXSvZZZrP$  daily.  ? ? ? ?2. PAF ?- some palpitations at times.  ?- compliant with meds ?  ? ?- recent palpitations.  ? ?  ?Other medical issues not addressed this visit ?  ?1. LE edema/Chronic diastolic HF ?- 16/1096 echo LVEF 60-65%, elevated LVEDP, mild RV dysfunction, mild to mod LAE, ?- has been on torsemide $RemoveBefo'60mg'WMVvCMilEoD$  daily ?  ?- working on weight loss, dieting. Down to 228 lbs. Mainly drinks a healthy drink mix she created on her own.  ?- some swelling at times, controlled with torsemide overall.  ?  ?  ?11/2020 echo LVEF 04-54%, indet  diastolic, low normal RV function ?- swelling doing well. Taking torsemide $RemoveBeforeDE'60mg'OkNdIjELBtAoAlU$  and controls edema.  ?- most recent labs Cr was normal. Home weights 201 lbs ?  ?  ?  ? ?  ?  ?3. Prior ASD repair ?  ?4. Asthma/Bronchitis ?- recent admission 04/2021 ?- followed by Dr Elsworth Soho ?  ?5. COVID + in Jan 2022 ?- recurrent infection 05/2021 ?Past Medical History:  ?Diagnosis Date  ? Asthma   ? Atrial fibrillation ([place])   ? Bipolar disorder ([place])   ? Bronchiectasis ([place])   ? Carpal tunnel syndrome of right wrist   ? CHF (congestive heart failure) ([place])   ? Complication of anesthesia   ? "my Dr in [place] told me I had an allergic reaction to the combination of the pain meds and the anesthesia" she does not remember what happened but "I eneded up in the ICU for 5 days".  ? H/O congenital atrial septal defect (ASD) repair   ? Hypogammaglobulinemia ([place])   ? Hypothyroid   ? 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 Neuropathy   ? Neuropathy   ? Osteoarthritis   ? Pinched nerve in neck   ? PTSD (post-traumatic stress disorder)   ? Recurrent sinusitis   ? Short-term memory loss   ? Sleep apnea   ? TIA (transient ischemic attack)   ?  Tremors of nervous system   ? seeing PCP for this.  ? Type 2 diabetes mellitus ([place])   ? Urticaria   ? Wound infection   ? ? ? ?Allergies  ?Allergen Reactions  ? Ativan [Lorazepam] Hives  ? Phenergan [Promethazine Hcl] Hives  ? ? ? ?Current Outpatient Medications  ?Medication Sig Dispense Refill  ? acetaminophen (TYLENOL) 325 MG tablet Take 2 tablets (650 mg total) by mouth every 6 (six) hours as needed for mild pain, fever or headache. 30 tablet 1  ? albuterol (PROVENTIL) (2.5 MG/3ML) 0.083% nebulizer solution Take 3 mLs (2.5 mg total) by nebulization every 4 (four) hours as needed for wheezing or shortness of breath. 75 mL 1  ? albuterol (VENTOLIN HFA) 108 (90 Base) MCG/ACT inhaler INHALE 2 PUFFS INTO THE LUNGS EVERY 6 HOURS AS NEEDED FOR WHEEZING OR SHORTNESS OF BREATH 18 g 1  ? apixaban (ELIQUIS) 5 MG TABS tablet Take 1  tablet (5 mg total) by mouth 2 (two) times daily. 180 tablet 3  ? atorvastatin (LIPITOR) 10 MG tablet Take 10 mg by mouth every evening.     ? BD PEN NEEDLE NANO 2ND GEN 32G X 4 MM MISC 1 each by Other route in the morning, at noon, in the evening, and at bedtime.    ? Benralizumab (FASENRA PEN) 30 MG/ML SOAJ INJECT $RemoveBef'30MG'UaKPnTOHkC$  SUBCUTANEOUSLY  EVERY 8 WEEKS 1 mL 6  ? bisoprolol (ZEBETA) 5 MG tablet Take 0.5 tablets (2.5 mg total) by mouth daily. 45 tablet 3  ? blood glucose meter kit and supplies 1 each by Other route 4 (four) times daily. Dispense based on patient and insurance preference. Use up to four times daily as directed. (FOR ICD-10 E10.9, E11.9). 1 each 0  ? Cholecalciferol (VITAMIN D3) 125 MCG (5000 UT) CAPS Take 1 capsule (5,000 Units total) by mouth daily. 90 capsule 0  ? Continuous Blood Gluc Sensor (FREESTYLE LIBRE 3 SENSOR) MISC 1 Piece by Does not apply route every 14 (fourteen) days. Place 1 sensor on the skin every 14 days. Use to check glucose continuously 6 each 2  ? diltiazem (CARDIZEM CD) 360 MG 24 hr capsule TAKE 1 CAPSULE(360 MG) BY MOUTH DAILY 90 capsule 3  ? divalproex (DEPAKOTE ER) 500 MG 24 hr tablet Take 1 tablet (500 mg total) by mouth daily AND 2 tablets (1,000 mg total) at bedtime. 270 tablet 0  ? Dulaglutide (TRULICITY) 1.5 JK/9.3OI SOPN ADMINISTER 1.5 MG UNDER THE SKIN ONCE A WEEK ON SUNDAY 2 mL 2  ? ELDERBERRY PO Take 4 g by mouth daily. Gummies 2g each    ? EPINEPHrine 0.3 mg/0.3 mL IJ SOAJ injection Inject 0.3 mg into the muscle once as needed for anaphylaxis.    ? ferrous sulfate 324 MG TBEC Take 324 mg by mouth daily with breakfast.    ? fluticasone furoate-vilanterol (BREO ELLIPTA) 100-25 MCG/ACT AEPB Inhale 1 puff into the lungs daily. 60 each 11  ? gabapentin (NEURONTIN) 600 MG tablet Take 600 mg by mouth 3 (three) times daily.    ? hydrALAZINE (APRESOLINE) 25 MG tablet Take 25 mg by mouth 3 (three) times daily.    ? hydrOXYzine (ATARAX/VISTARIL) 25 MG tablet Take 1 tablet (25 mg  total) by mouth every 6 (six) hours as needed for anxiety or nausea. 30 tablet 0  ? Immune Globulin, Human, 4 GM/20ML SOLN Inject 100 mLs into the skin every 14 (fourteen) days.    ? insulin glargine (LANTUS SOLOSTAR) 100 UNIT/ML Solostar Pen Inject  40 Units into the skin at bedtime. 30 mL 1  ? Insulin Pen Needle (PEN NEEDLES 3/16") 31G X 5 MM MISC Use as directed with insulin pen 100 each 0  ? levothyroxine (SYNTHROID, LEVOTHROID) 112 MCG tablet Take 112 mcg by mouth daily before breakfast.    ? linaclotide (LINZESS) 145 MCG CAPS capsule Take 1 capsule (145 mcg total) by mouth daily before breakfast. 90 capsule 3  ? Melatonin 3 MG TABS Take 3 mg by mouth at bedtime.    ? metFORMIN (GLUCOPHAGE) 500 MG tablet Take 500 mg by mouth 2 (two) times daily.    ? metoCLOPramide (REGLAN) 5 MG tablet Take 5 mg by mouth 3 (three) times daily.    ? montelukast (SINGULAIR) 10 MG tablet Take 1 tablet by mouth daily.    ? Multiple Vitamins-Minerals (MULTIVITAMIN WITH MINERALS) tablet Take 1 tablet by mouth daily. Woman    ? naproxen (NAPROSYN) 500 MG tablet Take 1 tablet (500 mg total) by mouth 2 (two) times daily with a meal. 30 tablet 0  ? Nebulizer MISC Nebulizer tubing kit 2 each 5  ? omeprazole (PRILOSEC) 20 MG capsule TAKE 1 CAPSULE(20 MG) BY MOUTH TWICE DAILY BEFORE A MEAL 60 capsule 5  ? ondansetron (ZOFRAN) 4 MG tablet Take 1 tablet (4 mg total) by mouth every 8 (eight) hours as needed for nausea or vomiting. 20 tablet 0  ? ONETOUCH ULTRA test strip 1 each by Other route in the morning, at noon, in the evening, and at bedtime.    ? polyethylene glycol (MIRALAX / GLYCOLAX) 17 g packet Take 17 g by mouth daily as needed. 14 each 0  ? Potassium Chloride ER 20 MEQ TBCR Take 20 mEq by mouth daily.    ? Tiotropium Bromide Monohydrate (SPIRIVA RESPIMAT) 1.25 MCG/ACT AERS Inhale 2 puffs into the lungs daily. (Patient not taking: Reported on 12/20/2021) 4 g 1  ? topiramate (TOPAMAX) 100 MG tablet Take 1 tablet (100 mg total) by  mouth 2 (two) times daily. 180 tablet 2  ? torsemide (DEMADEX) 20 MG tablet Take 60 mg by mouth daily.    ? triamcinolone cream (KENALOG) 0.1 % Apply 1 application topically 2 (two) times daily.    ? vitamin B-1

## 2021-12-27 NOTE — Patient Instructions (Addendum)
Medication Instructions:  ?Your physician has recommended you make the following change in your medication:  ?Change torsemide 20 mg to taking one on Monday-Wednesday-Friday ?Continue other medications the same ? ?Labwork: ?none ? ?Testing/Procedures: ?ZIO- Long Term Monitor Instructions  ? ?Your physician has requested you wear your ZIO patch monitor 7 days.  ? ?This is a single patch monitor.  [employer] supplies one patch monitor per enrollment.  Additional stickers are not available. ?  ?Please do not apply patch if you will be having a Nuclear Stress Test, Echocardiogram, Cardiac CT, MRI, or Chest Xray during the time frame you would be wearing the monitor. The patch cannot be worn during these tests.  You cannot remove and re-apply the ZIO XT patch monitor. ?  ?  ?Once you have received you monitor, please review enclosed instructions.  Your monitor has already been registered assigning a specific monitor serial # to you. ? ? ?Applying the monitor  ? ?Shave hair from upper left chest. ?  ?Hold abrader disc by orange tab.  Rub abrader in 40 strokes over left upper chest as indicated in your monitor instructions. ?  ?Clean area with 4 enclosed alcohol pads .  Use all pads to assure are is cleaned thoroughly.  Let dry.  ? ?Apply patch as indicated in monitor instructions.  Patch will be place under collarbone on left side of chest with arrow pointing upward. ?  ?Rub patch adhesive wings for 2 minutes.Remove white label marked "1".  Remove white label marked "2".  Rub patch adhesive wings for 2 additional minutes. ?  ?While looking in a mirror, press and release button in center of patch.  A small green light will flash 3-4 times .  This will be your only indicator the monitor has been turned on. ?    ?Do not shower for the first 24 hours.  You may shower after the first 24 hours. ?  ?Press button if you feel a symptom. You will hear a small click.  Record Date, Time and Symptom in the Patient Log Book. ?  ?When you  are ready to remove patch, follow instructions on last 2 pages of Patient Log Book.  Stick patch monitor onto last page of Patient Log Book. ?  ?Place Patient Log Book in [facility] box.  Use locking tab on box and tape box closed securely.  The [employer] has [employer] on it.  Please place in mailbox as soon as possible.  Your physician should have your test results approximately 7 days after the monitor has been mailed back to [facility]. ?  ?Call [facility] at [phone] if you have questions regarding your ZIO XT patch monitor.  Call them immediately if you see an orange light blinking on your monitor. ?  ?If your monitor falls off in less than 4 days contact our Monitor department at [phone].  If your monitor becomes loose or falls off after 4 days call [employer] at [phone] for suggestions on securing your monitor. ? ?Follow-Up: ?Your physician recommends that you schedule a follow-up appointment in: 02/17/2022 at planned ? ?Any Other Special Instructions Will Be Listed Below (If Applicable). ?Call office or send mychart message on Friday this week with an update on your symptoms and weights ? ?If you need a refill on your cardiac medications before your next appointment, please call your pharmacy. ?

## 2021-12-27 NOTE — Progress Notes (Signed)
Patient came in today and restarted her injections. Patient has been on injections before and did self administration previously and then stopped due to insurance issues. Patient came in today to do another refresher on how to administer her injections. I showed patient how to self administer and patient completed the injection in office with assistance. I informed patient that she would have to restart her injections with the three injections every four weeks then move to every eight weeks after her third injections. Patient verbalized understanding and agreed to do so. She stated that she would get in contact with her insurance company to have more injections sent to her home for self administration. Patient waited in the lobby for roughly twenty minutes without issue.  ?

## 2021-12-27 NOTE — Telephone Encounter (Signed)
PERCERT: ? ?7 Day ZIO XT dx: palpitatons ?

## 2021-12-28 NOTE — Telephone Encounter (Signed)
Addressed during office visit on 12/27/21. ?

## 2021-12-29 ENCOUNTER — Ambulatory Visit (INDEPENDENT_AMBULATORY_CARE_PROVIDER_SITE_OTHER): Payer: Medicare Other | Admitting: Clinical

## 2021-12-29 ENCOUNTER — Other Ambulatory Visit: Payer: Self-pay

## 2021-12-29 DIAGNOSIS — F4310 Post-traumatic stress disorder, unspecified: Secondary | ICD-10-CM | POA: Diagnosis not present

## 2021-12-29 NOTE — Progress Notes (Signed)
Virtual Visit via Video Note ?  ?I connected with Laura [name] on 12/29/21 at  9:00 AM EST by a video enabled telemedicine application and verified that I am speaking with the correct person using two identifiers. ?  ?Location: ?Patient: Home ?Provider: Office ?  ?I discussed the limitations of evaluation and management by telemedicine and the availability of in person appointments. The patient expressed understanding and agreed to proceed. ?  ?THERAPIST PROGRESS NOTE ?  ?Session Time: 9:00 AM-:9:55 AM ?  ?Participation Level: Active ?  ?Behavioral Response: CasualAlertDepressed ?  ?Type of Therapy: Individual Therapy ?  ?Treatment Goals addressed: Coping ?  ?Interventions: CBT, DBT, Solution Focused, Strength-based and Supportive ?  ?Summary: Laura [name] is a 61 y.o. female who presents with PTSD. The OPT therapist worked with the patient for her ongoing OPT treatment session. The OPT therapist utilized Motivational Interviewing to assist in creating therapeutic repore.The patient in the session was engaged and work in collaboration giving feedback about her triggers and symptoms over the past few weeks.he patient spoke about getting in touch with her doctor to further review her medication prior to a scheduled foot surgery. The patient noted that the anesthesiologist called off the surgery procedure  as an outpatient and the patient will be having her surgery procedure at the hospital following the approval of the patients cardiologist. The OPT therapist utilized Cognitive Behavioral Therapy through cognitive restructuring as well as worked with the patient on coping strategies to assist in management of mental health symptoms. The patient reviewed her ongoing work on her physical health. The OPT therapist placed emphasis on the patient keeping all upcoming health appointments and adhering to ongoing health treatment recommendations.  ?  ?Suicidal/Homicidal: Nowithout intent/plan ?  ?Therapist Response:The  OPT therapist worked with the patient for the patients scheduled session. The patient was engaged in her session and gave feedback in relation to triggers, symptoms, and behavior responses over the past few weeks. The OPT therapist worked with the patient utilizing an in session Cognitive Behavioral Therapy exercise. The patient was responsive in the session and verbalized, " I did finally get in touch with the Dr. Who was going to do my surgery and reviewed my medication then the procedure was cancelled and I was told I would have to get the procedure done in the hospital, then I was told I would have to get approval from my cardiologist so then I set up an appointment with my cardiologist and then I passed out and I was hospitalized for dehydration" the cardiologist noted that due to my rapid weight loss my medication had not been readjusted to my current weight and this was a contributing factor". The patient noted until the cardiology problem is figured out I cant get approved to have my foot surgery and my weight and blood pressure has to stabilize. The patient was seen at her PCP office and spoke about conflict with a staff member who the patient felt was rude and unprofessional and she reviewed this with her PCP and noted that she is considering filing a complaint against the staff member.The patient will continue to work with her health care providers including PCP and Cardiologist.The OPT therapist will continue treatment work with the patient in her next scheduled session. ?  ?Plan: Return again in 2/3 weeks. ?  ?Diagnosis:      Axis I:Post Traumatic Stress Disorder ?  ?  Axis II: No diagnosis ?  ?  ?Collaboration of Care:  ?  ?Patient/Guardian was advised Release of Information must be obtained prior to any record release in order to collaborate their care with an outside provider. Patient/Guardian was advised if they have not already done so to contact the registration department to  sign all necessary forms in order for [place] to release information regarding their care.  ?  ?Consent: Patient/Guardian gives verbal consent for treatment and assignment of benefits for services provided during this visit. Patient/Guardian expressed understanding and agreed to proceed.  ?  ?  ?I discussed the assessment and treatment plan with the patient. The patient was provided an opportunity to ask questions and all were answered. The patient agreed with the plan and demonstrated an understanding of the instructions. ?  ?The patient was advised to call back or seek an in-person evaluation if the symptoms worsen or if the condition fails to improve as anticipated. ?  ?I provided 55 minutes of non-face-to-face time during this encounter. ?  ?[name], LCSW ?  ?12/29/2021 ?

## 2021-12-30 ENCOUNTER — Encounter: Payer: Medicare Other | Admitting: Podiatry

## 2021-12-31 ENCOUNTER — Other Ambulatory Visit: Payer: Self-pay

## 2021-12-31 ENCOUNTER — Ambulatory Visit (INDEPENDENT_AMBULATORY_CARE_PROVIDER_SITE_OTHER): Payer: Medicare Other | Admitting: Family Medicine

## 2021-12-31 ENCOUNTER — Encounter: Payer: Self-pay | Admitting: Family Medicine

## 2021-12-31 DIAGNOSIS — D839 Common variable immunodeficiency, unspecified: Secondary | ICD-10-CM

## 2021-12-31 DIAGNOSIS — R051 Acute cough: Secondary | ICD-10-CM | POA: Diagnosis not present

## 2021-12-31 DIAGNOSIS — J4551 Severe persistent asthma with (acute) exacerbation: Secondary | ICD-10-CM | POA: Diagnosis not present

## 2021-12-31 DIAGNOSIS — J310 Chronic rhinitis: Secondary | ICD-10-CM

## 2021-12-31 DIAGNOSIS — K219 Gastro-esophageal reflux disease without esophagitis: Secondary | ICD-10-CM

## 2021-12-31 MED ORDER — FLUTICASONE PROPIONATE HFA 110 MCG/ACT IN AERO: 2.0000 | INHALATION_SPRAY | Freq: Two times a day (BID) | RESPIRATORY_TRACT | 5 refills | Status: DC

## 2021-12-31 MED ORDER — PREDNISONE 10 MG PO TABS: ORAL_TABLET | ORAL | 0 refills | Status: DC

## 2021-12-31 MED ORDER — LEVALBUTEROL HCL 1.25 MG/3ML IN NEBU: 1.2500 mg | INHALATION_SOLUTION | Freq: Four times a day (QID) | RESPIRATORY_TRACT | 5 refills | Status: DC | PRN

## 2021-12-31 MED ORDER — LEVALBUTEROL TARTRATE 45 MCG/ACT IN AERO
2.0000 | INHALATION_SPRAY | Freq: Four times a day (QID) | RESPIRATORY_TRACT | 5 refills | Status: DC | PRN
Start: 2021-12-31 — End: 2022-02-07

## 2021-12-31 MED ORDER — OMEPRAZOLE 20 MG PO CPDR: 20.0000 mg | DELAYED_RELEASE_CAPSULE | Freq: Two times a day (BID) | ORAL | 5 refills | Status: DC

## 2021-12-31 NOTE — Telephone Encounter (Signed)
BP's are elevated but 150s to 170s would not be causing any symptoms and is ok for the short term. If we were to try to lower them right now dizziness would get worst while she is dehydrated. If she is still dizzy with standing then she is still dehydrated and the weight gain is appropriate water weight to add back. The cold like symptoms would defer to pcp or allergist. I think 7 days on monitor is fine for now, we can see what data we get and go from there. ? ? ?Zandra Abts MD ?

## 2021-12-31 NOTE — Patient Instructions (Addendum)
Asthma ?Begin prednisone 10 mg tablets. Take 2 tablets twice a day for 3 days, then take 2 tablets once a day for 1 day, then take 1 tablet on the 5th day, then stop. Continue to monitor your blood sugar while taking prednisone ?Begin Flovent 110-2 puffs twice a day for the next 2 weeks or until cough and wheeze free ?Continue montelukast 10 mg once a day to prevent cough or wheeze ?Continue Breo 100-1 puff once a day to prevent cough or wheeze ?Begin levalbuterol 2 puffs every 6 hours as needed for cough or wheeze OR instead use levalbuterol solution via nebulizer one unit vial every 6 hours as needed for cough or wheeze ?You may continue DuoNeb once every 6 hours instead of albuterol if needed for asthma flare ?Continue Nucala injections once every 4 weeks ? ?Chronic rhinitis ?Continue an antihistamine once a day as needed for a runny nose or itch ?Begin Flonase 2 sprays in each nostril once a day for a stuffy nose ?Consider saline nasal rinses as needed for nasal symptoms. Use this before any medicated nasal sprays for best result ? ?Reflux ?Continue dietary and lifestyle modifications as listed below ?Increase omeprazole to 20 mg twice a day. Take this medication 30 minutes before your first meal ? ?Cough ?Call the clinic if your cough worsens or if you develop a fever and we will move forward with chest xray ? ?CVID ?Continue Cuvitru once every other week ?Continue to keep track of infections, antibiotic use and prednisone use ? ?Call the clinic if this treatment plan is not working well for you ? ?Follow up in the clinic in 1 month or sooner if needed. ?

## 2021-12-31 NOTE — Progress Notes (Signed)
? ?RE: Arthella Headings MRN: [id number] DOB: May 29, 1961 ?Date of Telemedicine Visit: 12/31/2021 ? ?Referring provider: [name], MD ?Primary care provider: [name], MD ? ?Chief Complaint: Asthma (Telephone office visit - Not been very good) and Cough (12/20/21 - dry) ? ? ?Telemedicine Follow Up Visit via Telephone: ?I connected with Laura [name] for a follow up on 12/31/21 by telephone and verified that I am speaking with the correct person using two identifiers. ?  ?I discussed the limitations, risks, security and privacy concerns of performing an evaluation and management service by telephone and the availability of in person appointments. I also discussed with the patient that there may be a patient responsible charge related to this service. The patient expressed understanding and agreed to proceed. ? ?Patient is at home  ?Provider is at the office.  ?Visit start time: 1130 ?Visit end time: 1215 ?Insurance consent/check in by: Alexia ?Medical consent and medical assistant/nurse: Sharyn Lull ? ?History of Present Illness: ?She is a 61 y.o. female, who is being followed for asthma, chronic rhinitis, C VID, and reflux.  Her medical history includes A-fib and enlarged lymph node noted on CT. Her previous allergy office visit was on 11/24/2021 with Dr Ernst Bowler.  At that time she was noted to have acute sinusitis and was treated with Augmentin.  In the interim, she reports that she has been having episodes of lightheadedness, dizziness, and syncope.  She reports that on March 13 she was sent to the emergency department from her cardiologist office for evaluation of syncope.  At that time, she reports that she began to experience symptoms including cough and shortness of breath.  At today's visit, she reports symptoms including not feeling well, sore throat, and cough.  She reports her asthma has been poorly controlled with symptoms including shortness of breath with activity and rest, wheeze which is worse during  the day, and cough which is described as mostly dry and occasionally producing clear thick mucus.  She continues montelukast 10 mg once a day, Breo 100-1 puff once a day, and has been using albuterol once every 4 hours for over 1 week with moderate relief of symptoms.  She did receive a Fasenra injection 4 days ago.  Allergic rhinitis is reported as poorly controlled with symptoms including clear thick rhinorrhea, nasal congestion, sneezing, and postnasal drainage with frequent throat clearing.  She is occasionally using nasal saline rinses and is not currently using Flonase.  She denies fever and sick contacts.  She does report 1 episode of chills occurring yesterday.  Reflux is reported as moderately well controlled with omeprazole 20 mg once a day.  She does report that she has been out of omeprazole for the last several days.  She continues Cuvitru infusions once every other week with no adverse reaction.  She last received an antibiotic for acute sinusitis in December 2022.  Her current medications are listed in the chart. ? ?Assessment and Plan: ?Laura [name] is a 61 y.o. female with: ?Patient Instructions  ?Asthma ?Begin prednisone 10 mg tablets. Take 2 tablets twice a day for 3 days, then take 2 tablets once a day for 1 day, then take 1 tablet on the 5th day, then stop. Continue to monitor your blood sugar while taking prednisone ?Begin Flovent 110-2 puffs twice a day for the next 2 weeks or until cough and wheeze free ?Continue montelukast 10 mg once a day to prevent cough or wheeze ?Continue Breo 100-1 puff once a day to prevent cough or  wheeze ?Begin levalbuterol 2 puffs every 6 hours as needed for cough or wheeze OR instead use levalbuterol solution via nebulizer one unit vial every 6 hours as needed for cough or wheeze ?You may continue DuoNeb once every 6 hours instead of albuterol if needed for asthma flare ?Continue Nucala injections once every 4 weeks ? ?Chronic rhinitis ?Continue an antihistamine once a  day as needed for a runny nose or itch ?Begin Flonase 2 sprays in each nostril once a day for a stuffy nose ?Consider saline nasal rinses as needed for nasal symptoms. Use this before any medicated nasal sprays for best result ? ?Reflux ?Continue dietary and lifestyle modifications as listed below ?Increase omeprazole to 20 mg twice a day. Take this medication 30 minutes before your first meal ? ?Cough ?Call the clinic if your cough worsens or if you develop a fever and we will move forward with chest xray ? ?CVID ?Continue Cuvitru once every other week ?Continue to keep track of infections, antibiotic use and prednisone use ? ?Call the clinic if this treatment plan is not working well for you ? ?Follow up in the clinic in 1 month or sooner if needed.  ?Return in about 4 weeks (around 01/28/2022), or if symptoms worsen or fail to improve. ? ?Meds ordered this encounter  ?Medications  ? fluticasone (FLOVENT HFA) 110 MCG/ACT inhaler  ?  Sig: Inhale 2 puffs into the lungs 2 (two) times daily.  ?  Dispense:  1 each  ?  Refill:  5  ? omeprazole (PRILOSEC) 20 MG capsule  ?  Sig: Take 1 capsule (20 mg total) by mouth 2 (two) times daily before a meal.  ?  Dispense:  30 capsule  ?  Refill:  5  ? predniSONE (DELTASONE) 10 MG tablet  ?  Sig: Begin prednisone 10 mg tablets. Take 2 tablets twice a day for 3 days, then take 2 tablets once a day for 1 day, then take 1 tablet on the 5th day, then stop  ?  Dispense:  15 tablet  ?  Refill:  0  ? ? ?Medication List:  ?Current Outpatient Medications  ?Medication Sig Dispense Refill  ? acetaminophen (TYLENOL) 325 MG tablet Take 2 tablets (650 mg total) by mouth every 6 (six) hours as needed for mild pain, fever or headache. 30 tablet 1  ? albuterol (PROVENTIL) (2.5 MG/3ML) 0.083% nebulizer solution Take 3 mLs (2.5 mg total) by nebulization every 4 (four) hours as needed for wheezing or shortness of breath. 75 mL 1  ? albuterol (VENTOLIN HFA) 108 (90 Base) MCG/ACT inhaler INHALE 2 PUFFS  INTO THE LUNGS EVERY 6 HOURS AS NEEDED FOR WHEEZING OR SHORTNESS OF BREATH 18 g 1  ? apixaban (ELIQUIS) 5 MG TABS tablet Take 1 tablet (5 mg total) by mouth 2 (two) times daily. 180 tablet 3  ? atorvastatin (LIPITOR) 10 MG tablet Take 10 mg by mouth every evening.     ? BD PEN NEEDLE NANO 2ND GEN 32G X 4 MM MISC 1 each by Other route in the morning, at noon, in the evening, and at bedtime.    ? Benralizumab (FASENRA PEN) 30 MG/ML SOAJ INJECT 30MG SUBCUTANEOUSLY  EVERY 8 WEEKS 1 mL 6  ? bisoprolol (ZEBETA) 5 MG tablet Take 0.5 tablets (2.5 mg total) by mouth daily. 45 tablet 3  ? blood glucose meter kit and supplies 1 each by Other route 4 (four) times daily. Dispense based on patient and insurance preference. Use up to  four times daily as directed. (FOR ICD-10 E10.9, E11.9). 1 each 0  ? Cholecalciferol (VITAMIN D3) 125 MCG (5000 UT) CAPS Take 1 capsule (5,000 Units total) by mouth daily. 90 capsule 0  ? Continuous Blood Gluc Sensor (FREESTYLE LIBRE 3 SENSOR) MISC 1 Piece by Does not apply route every 14 (fourteen) days. Place 1 sensor on the skin every 14 days. Use to check glucose continuously 6 each 2  ? diltiazem (CARDIZEM CD) 360 MG 24 hr capsule TAKE 1 CAPSULE(360 MG) BY MOUTH DAILY 90 capsule 3  ? divalproex (DEPAKOTE ER) 500 MG 24 hr tablet Take 1 tablet (500 mg total) by mouth daily AND 2 tablets (1,000 mg total) at bedtime. 270 tablet 0  ? Dulaglutide (TRULICITY) 1.5 IW/5.8KD SOPN ADMINISTER 1.5 MG UNDER THE SKIN ONCE A WEEK ON SUNDAY 2 mL 2  ? ELDERBERRY PO Take 4 g by mouth daily. Gummies 2g each    ? EPINEPHrine 0.3 mg/0.3 mL IJ SOAJ injection Inject 0.3 mg into the muscle once as needed for anaphylaxis.    ? ferrous sulfate 324 MG TBEC Take 324 mg by mouth daily with breakfast.    ? fluticasone (FLOVENT HFA) 110 MCG/ACT inhaler Inhale 2 puffs into the lungs 2 (two) times daily. 1 each 5  ? fluticasone furoate-vilanterol (BREO ELLIPTA) 100-25 MCG/ACT AEPB Inhale 1 puff into the lungs daily. 60 each 11  ?  gabapentin (NEURONTIN) 600 MG tablet Take 600 mg by mouth 3 (three) times daily.    ? hydrALAZINE (APRESOLINE) 25 MG tablet Take 25 mg by mouth 3 (three) times daily.    ? hydrOXYzine (ATARAX/VISTAR

## 2022-01-04 ENCOUNTER — Other Ambulatory Visit (HOSPITAL_COMMUNITY): Payer: Self-pay | Admitting: Internal Medicine

## 2022-01-04 DIAGNOSIS — Z1231 Encounter for screening mammogram for malignant neoplasm of breast: Secondary | ICD-10-CM

## 2022-01-05 ENCOUNTER — Other Ambulatory Visit: Payer: Self-pay | Admitting: "Endocrinology

## 2022-01-06 ENCOUNTER — Other Ambulatory Visit (HOSPITAL_COMMUNITY)
Admission: RE | Admit: 2022-01-06 | Discharge: 2022-01-06 | Disposition: A | Discharge status: Home / Self Care | Payer: Medicare Other | Source: Ambulatory Visit | Attending: Adult Health | Admitting: Adult Health

## 2022-01-06 ENCOUNTER — Ambulatory Visit (INDEPENDENT_AMBULATORY_CARE_PROVIDER_SITE_OTHER): Payer: Medicare Other | Admitting: Adult Health

## 2022-01-06 ENCOUNTER — Encounter: Payer: Self-pay | Admitting: Adult Health

## 2022-01-06 VITALS — BP 148/60 | HR 70 | Ht 64.0 in | Wt 207.2 lb

## 2022-01-06 DIAGNOSIS — Z1151 Encounter for screening for human papillomavirus (HPV): Secondary | ICD-10-CM | POA: Insufficient documentation

## 2022-01-06 DIAGNOSIS — N949 Unspecified condition associated with female genital organs and menstrual cycle: Secondary | ICD-10-CM

## 2022-01-06 DIAGNOSIS — Z01419 Encounter for gynecological examination (general) (routine) without abnormal findings: Secondary | ICD-10-CM

## 2022-01-06 NOTE — Progress Notes (Signed)
Patient ID: Laura [name], female   DOB: 16-Mar-1961, 61 y.o.   MRN: [id number] ?History of Present Illness: ?Laura [name] is a 61 year old white female, widowed, PM in for a well woman gyn exam and pap. ?She is having drop in her BP when standing, seeing cardiology. She had adnexal cyst on CT last year and needs follow up US. ?PCP is Dr Nevada Crane. ? ? ?Current Medications, Allergies, Past Medical History, Past Surgical History, Family History and Social History were reviewed in [employer] record.   ? ? ?Review of Systems: ?Patient denies any headaches, hearing loss, fatigue, blurred vision, shortness of breath, chest pain, abdominal pain(no pain in RLQ lately), problems with bowel movements, or intercourse(not active). No joint pain or mood swings.  ?Denies any vaginal bleeding ?+dizzy ?+UI,seeing urology ?Has foot issue ?Sees psychiatrist  ? ? ?Physical Exam:BP (!) 148/60 (BP Location: Right Arm, Patient Position: Sitting, Cuff Size: Normal)   Pulse 70   Ht '5\' 4"'$  (1.626 m)   Wt 207 lb 3.2 oz (94 kg)   BMI 35.57 kg/m?   ?General:  Well developed, well nourished, no acute distress ?Skin:  Warm and dry ?Neck:  Midline trachea, normal thyroid, good ROM, no lymphadenopathy,no carotid bruits heard ?Lungs; Clear to auscultation bilaterally ?Breast:  No dominant palpable mass, retraction, or nipple discharge ?Cardiovascular: Regular rate and rhythm ?Abdomen:  Soft, non tender, no hepatosplenomegaly ?Pelvic:  External genitalia is normal in appearance, no lesions.  The vagina is pale. Urethra has no lesions or masses. The cervix is smooth, pap with HR HPV genotyping performed.  Uterus is felt to be normal size, shape, and contour.  No adnexal masses or tenderness noted.Bladder is non tender, no masses felt. ?Rectal: Deferred ?Extremities/musculoskeletal:  No swelling or varicosities noted, no clubbing or cyanosis ?Psych:  No mood changes, alert and cooperative,seems happy ?AA is 0 ?Fall risk is moderate ? ?   01/06/2022  ?  9:55 AM 04/29/2021  ?  1:31 PM 01/29/2021  ? 11:18 AM  ?Depression screen PHQ 2/9  ?Decreased Interest 2    ?Down, Depressed, Hopeless 2    ?PHQ - 2 Score 4    ?Altered sleeping 3    ?Tired, decreased energy 2    ?Change in appetite 3    ?Feeling bad or failure about yourself  0    ?Trouble concentrating 2    ?Moving slowly or fidgety/restless 2    ?Suicidal thoughts 0    ?PHQ-9 Score 16    ?  ? Information is confidential and restricted. Go to Review Flowsheets to unlock data.  ?  ? ?  01/06/2022  ?  9:55 AM 01/19/2021  ?  2:41 PM  ?GAD 7 : Generalized Anxiety Score  ?Nervous, Anxious, on Edge 2 0  ?Control/stop worrying 2 0  ?Worry too much - different things 2 0  ?Trouble relaxing 2 0  ?Restless 0 0  ?Easily annoyed or irritable 2 0  ?Afraid - awful might happen 0 0  ?Total GAD 7 Score 10 0  ? ?  ? [facility] - 01/06/22 0949   ? ?  ? Pregnancy Intention Screening  ? Does the patient want to become pregnant in the next year? N/A   ? Does the patient's partner want to become pregnant in the next year? N/A   ? Would the patient like to discuss contraceptive options today? N/A   ?  ? Contraception Wrap Up  ? Current Method No Method - Other Reason  postmenopausal  ? End Method No Method - Other Reason   ? Contraception Counseling Provided No   ? ?  ?  ? ?  ? Examination chaperoned by Glenard Haring RN ? ?Impression and Plan: ? ?1. Encounter for gynecological examination with Papanicolaou smear of cervix ?Pap sent ?Physical with PCP ?Pap in 3 year if normal ?Labs with PCP ?Mammogram yearly ?Colonoscopy per GI  ?- Cytology - PAP ? ?2. Adnexal cyst ?Will get [place] at [facility] 01/11/22 at 3:30 pm to reassess uterus and ovaries ?Will talk when results back ?- US PELVIC COMPLETE WITH TRANSVAGINAL; Future  ? ?  ?  ?

## 2022-01-09 LAB — CYTOLOGY - PAP
Adequacy: ABSENT
Chlamydia: NEGATIVE
Comment: NEGATIVE
Comment: NEGATIVE
Comment: NORMAL
Diagnosis: NEGATIVE
High risk HPV: NEGATIVE
Neisseria Gonorrhea: NEGATIVE

## 2022-01-10 ENCOUNTER — Telehealth: Payer: Self-pay | Admitting: Cardiology

## 2022-01-10 MED ORDER — HYDRALAZINE HCL 50 MG PO TABS: 50.0000 mg | ORAL_TABLET | Freq: Three times a day (TID) | ORAL | 3 refills | Status: DC

## 2022-01-10 NOTE — Telephone Encounter (Signed)
If her blood pressures are in the 200s as reported we need to try to bring it down at least to some degree. For short term we can tolerate 160s or so but 200s I would try to bring down with medication change. If change in dizziness let us know ? ?Zandra Abts MD ?

## 2022-01-10 NOTE — Telephone Encounter (Signed)
Pt c/o swelling: STAT is pt has developed SOB within 24 hours ? ?If swelling, where is the swelling located? Not sure  ? ?How much weight have you gained and in what time span? 11 pounds in two weeks  ? ?Have you gained 3 pounds in a day or 5 pounds in a week? yes ? ?Do you have a log of your daily weights (if so, list)? 204, 208, 209, 211 ? ?Are you currently taking a fluid pill? yes ? ?Are you currently SOB? Yes (always short of breath patient has asthma) ? ?Have you traveled recently? no ? ?

## 2022-01-10 NOTE — Telephone Encounter (Signed)
Pt states that she has had increased SOB over the last week. She is now on new inhalers. She reports gaining 11 pounds over the last 2 weeks. She denies swelling at this time. Pt reports BP as 217/132 HR 59.  Pt has upcoming appt on 01/13/22.  ?

## 2022-01-10 NOTE — Telephone Encounter (Signed)
Called pt to give her instructions per provider and pt was concerned with the Hydralazine increase. Pt stated that she has been having dizzy spells for the last month and would like to verify that it is okay to increase Hydralazine. Will fwd to provider for input.  ?

## 2022-01-10 NOTE — Telephone Encounter (Signed)
Pt verbalized agreement to increasing Hydralazine to 50 mg tablets TID. Pt agreeable to bring bp cuff/log with her to 4/6 appt with provider. Pt had no other concerns at this time.  ?

## 2022-01-10 NOTE — Telephone Encounter (Signed)
Verify has been taking her hydralazine 25 mg tid at home, if so increase hydralazine to '50mg'$  tid. Bring bp cuff to appointment so we can compare to ours. Address fluid pill and weight at our 01/13/22 appt we had added on.  ? ?Zandra Abts MD ?

## 2022-01-11 ENCOUNTER — Other Ambulatory Visit (HOSPITAL_COMMUNITY)
Admission: RE | Admit: 2022-01-11 | Discharge: 2022-01-11 | Disposition: A | Discharge status: Home / Self Care | Payer: Medicare Other | Source: Ambulatory Visit | Attending: Psychiatry | Admitting: Psychiatry

## 2022-01-11 ENCOUNTER — Encounter: Payer: Self-pay | Admitting: Psychiatry

## 2022-01-11 ENCOUNTER — Ambulatory Visit (HOSPITAL_COMMUNITY)
Admission: RE | Admit: 2022-01-11 | Discharge: 2022-01-11 | Disposition: A | Discharge status: Home / Self Care | Payer: Medicare Other | Source: Ambulatory Visit | Attending: Adult Health | Admitting: Adult Health

## 2022-01-11 DIAGNOSIS — N949 Unspecified condition associated with female genital organs and menstrual cycle: Secondary | ICD-10-CM | POA: Diagnosis present

## 2022-01-11 DIAGNOSIS — F0630 Mood disorder due to known physiological condition, unspecified: Secondary | ICD-10-CM | POA: Insufficient documentation

## 2022-01-11 DIAGNOSIS — R002 Palpitations: Secondary | ICD-10-CM | POA: Diagnosis not present

## 2022-01-11 LAB — VALPROIC ACID LEVEL: Valproic Acid Lvl: 51 ug/mL (ref 50.0–100.0)

## 2022-01-11 IMAGING — US US PELVIS COMPLETE WITH TRANSVAGINAL
1 series · 13 of 25 positions shown · non-contrast
Comparison: [DATE], earlier CT [DATE]

CLINICAL DATA: Adnexal cyst on CT, follow-up, postmenopausal,
history Caesarean section

EXAM:
TRANSABDOMINAL AND TRANSVAGINAL ULTRASOUND OF PELVIS
TECHNIQUE: Both transabdominal and transvaginal ultrasound examinations of the
pelvis were performed. Transabdominal technique was performed for
global imaging of the pelvis including uterus, ovaries, adnexal
regions, and pelvic cul-de-sac. It was necessary to proceed with
endovaginal exam following the transabdominal exam to visualize the
endometrium and ovaries.

[Series 1: us pelvic complete with transvaginal · 13 of 96 slices shown]
[im 1/96]
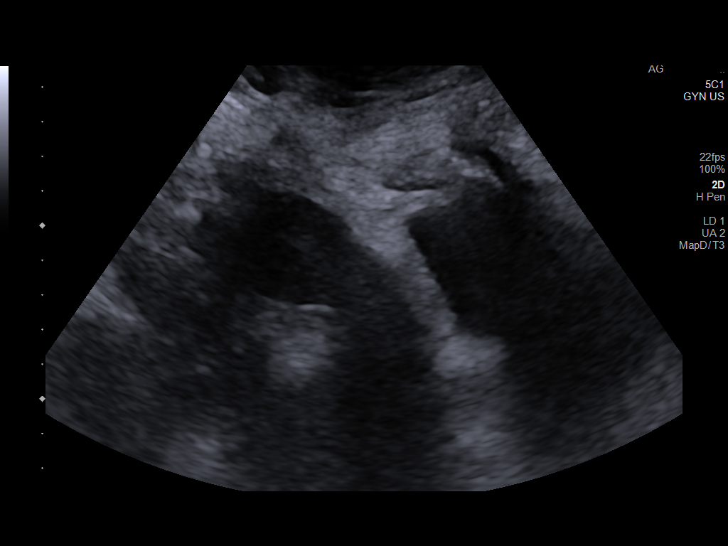
[im 8/96]
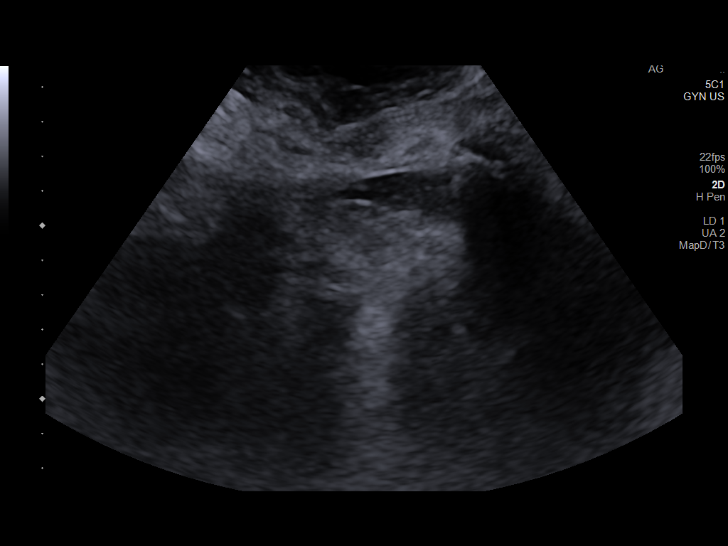
[im 16/96]
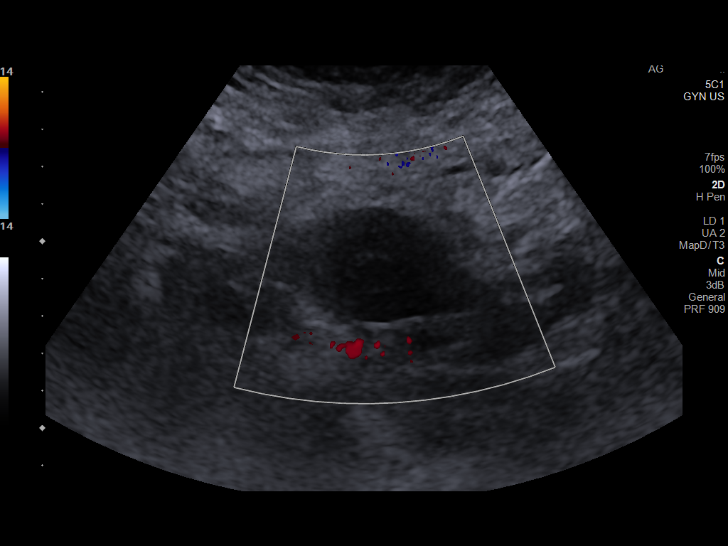
[im 24/96]
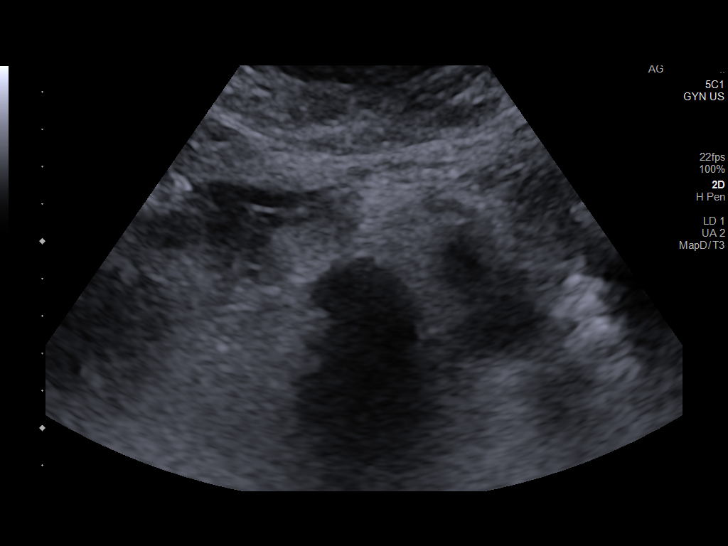
[im 32/96]
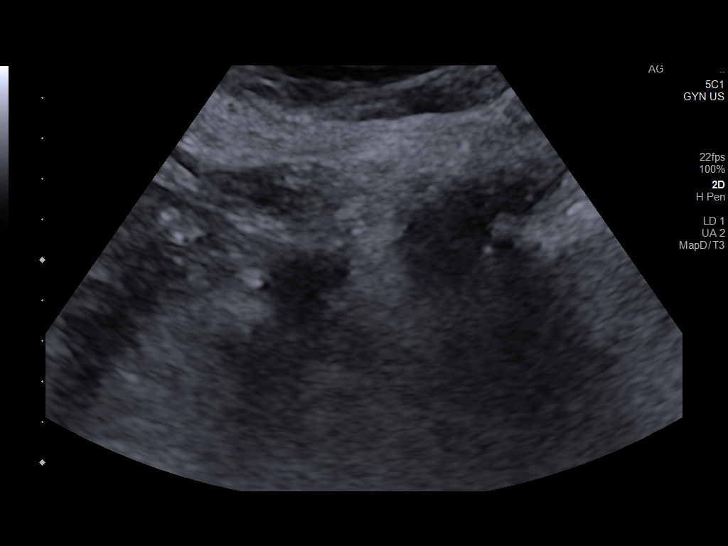
[im 40/96]
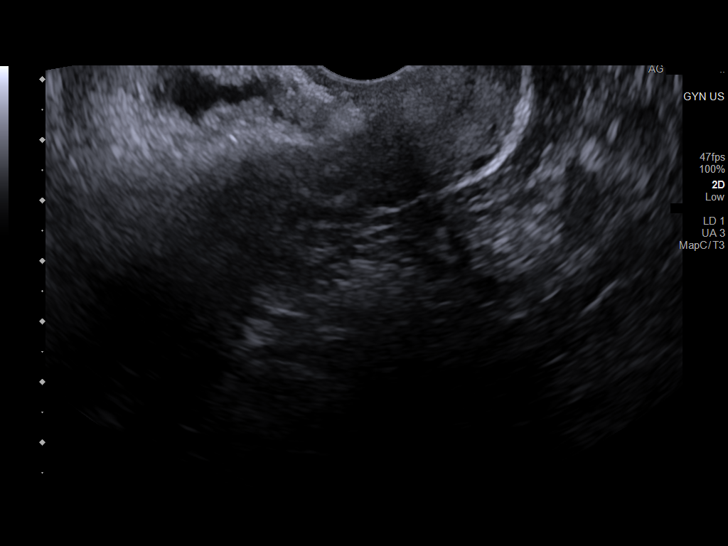
[im 48/96]
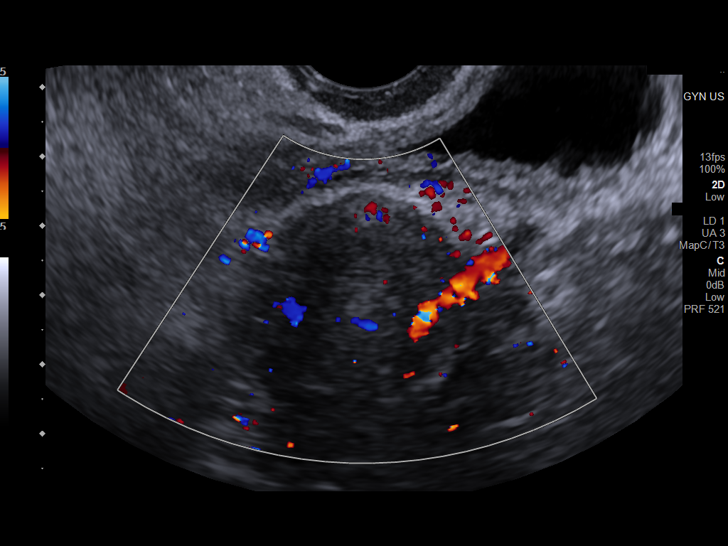
[im 56/96]
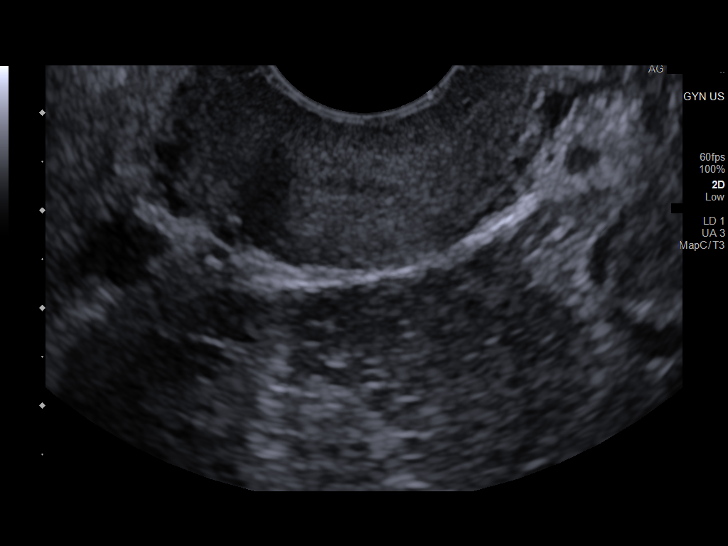
[im 64/96]
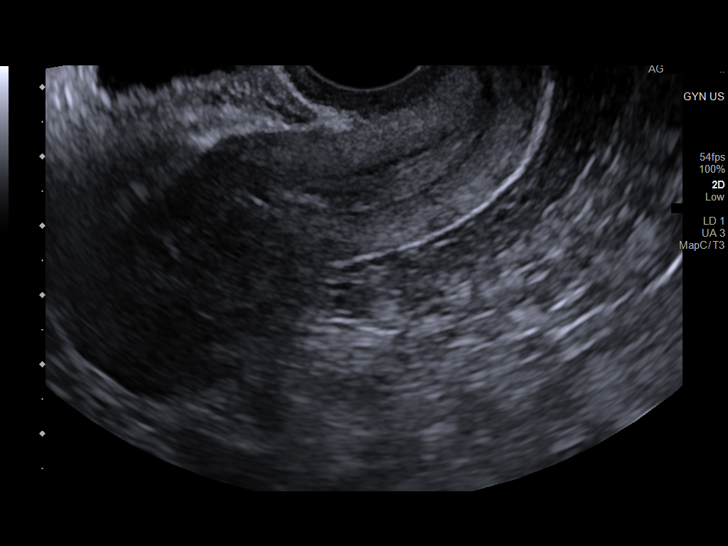
[im 72/96]
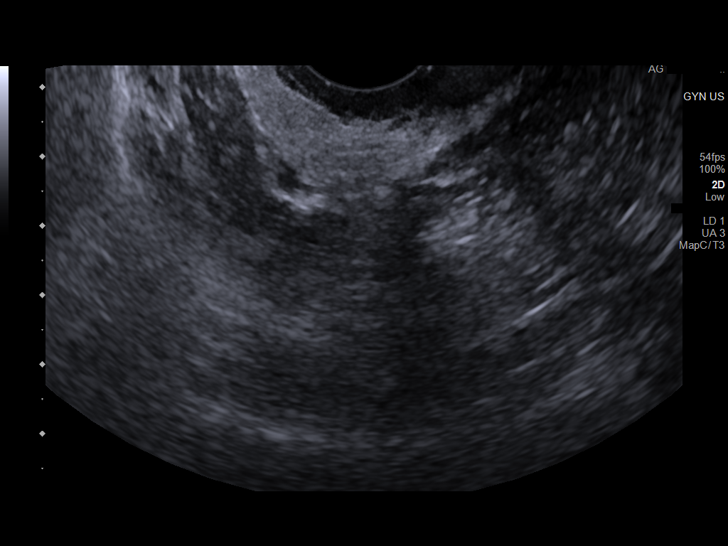
[im 80/96]
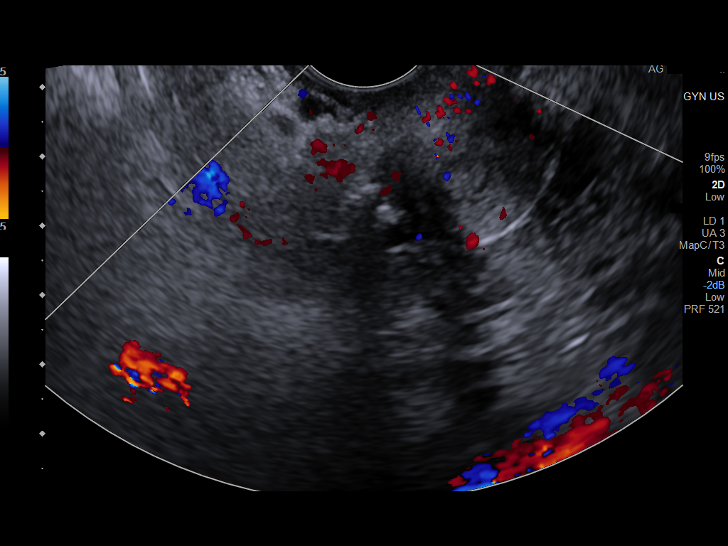
[im 88/96]
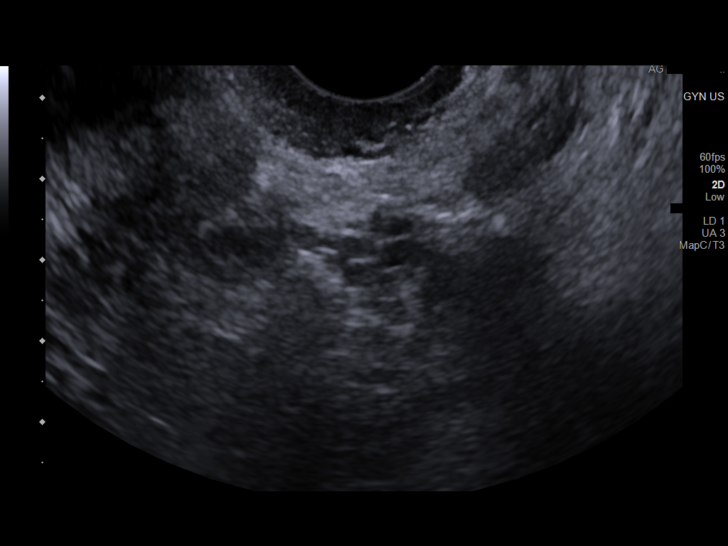
[im 96/96]
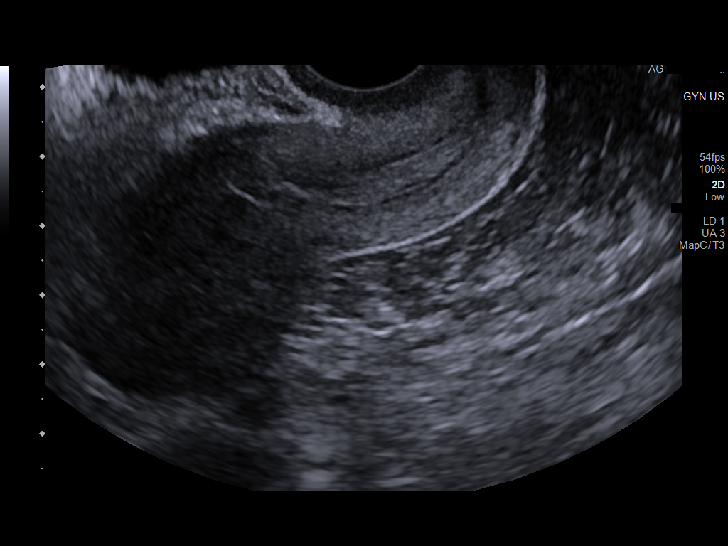

[13 of 25 positions shown; findings below may reference images not displayed]

FINDINGS: Uterus

Measurements: 8.3 x 3.1 x 4.3 cm = volume: 57 mL. Anteverted and
mildly retroflexed. Nabothian cysts at cervix. No focal mass.

Endometrium

Thickness: 9 mm.  Mildly inhomogeneous.  No discrete mass.

Right ovary

Not visualized, likely obscured by bowel

Left ovary

Not visualized, likely obscured by bowel

Other findings

No free pelvic fluid.  No adnexal masses.
IMPRESSION: Nonvisualization of ovaries.

No evidence of adnexal mass.

Endometrial complex 9 mm thick; endometrial thickness is considered
abnormal for an asymptomatic post-menopausal female and endometrial
sampling should be considered to exclude carcinoma.

## 2022-01-12 ENCOUNTER — Telehealth: Payer: Self-pay | Admitting: Adult Health

## 2022-01-12 LAB — HM DIABETES EYE EXAM

## 2022-01-12 NOTE — Telephone Encounter (Signed)
Pt aware no adnexal mass seen and EEC was 9 mm but she is not having any bleeding so no biopsy needed  ?

## 2022-01-13 ENCOUNTER — Encounter: Payer: Self-pay | Admitting: Cardiology

## 2022-01-13 ENCOUNTER — Other Ambulatory Visit (HOSPITAL_COMMUNITY)
Admission: RE | Admit: 2022-01-13 | Discharge: 2022-01-13 | Disposition: A | Discharge status: Home / Self Care | Payer: Medicare Other | Source: Ambulatory Visit | Attending: Cardiology | Admitting: Cardiology

## 2022-01-13 ENCOUNTER — Telehealth: Payer: Self-pay | Admitting: Cardiology

## 2022-01-13 ENCOUNTER — Encounter: Payer: Medicare Other | Admitting: Podiatry

## 2022-01-13 ENCOUNTER — Ambulatory Visit (INDEPENDENT_AMBULATORY_CARE_PROVIDER_SITE_OTHER): Payer: Medicare Other | Admitting: Cardiology

## 2022-01-13 VITALS — BP 150/86 | HR 78 | Ht 64.0 in | Wt 206.8 lb

## 2022-01-13 DIAGNOSIS — I4891 Unspecified atrial fibrillation: Secondary | ICD-10-CM | POA: Diagnosis not present

## 2022-01-13 DIAGNOSIS — I951 Orthostatic hypotension: Secondary | ICD-10-CM | POA: Diagnosis not present

## 2022-01-13 DIAGNOSIS — I10 Essential (primary) hypertension: Secondary | ICD-10-CM

## 2022-01-13 DIAGNOSIS — H5213 Myopia, bilateral: Secondary | ICD-10-CM | POA: Diagnosis not present

## 2022-01-13 LAB — CORTISOL: Cortisol, Plasma: 7.3 ug/dL

## 2022-01-13 MED ORDER — TORSEMIDE 20 MG PO TABS: 20.0000 mg | ORAL_TABLET | ORAL | 3 refills | Status: DC | PRN

## 2022-01-13 NOTE — Progress Notes (Signed)
Clinical Summary Laura [name] is a 61 y.o.female seen today for follow up of the following medical problems.     1.Chest pain/syncope - ER visit 12/09/21 with chest pain - trop neg x 2. EKG rate controlled afib - BUN 25 Cr 0.76  - CXR no acute process. RLE venous US neg -given toradol   08/2019 cath LV end diastolic pressure is mildly elevated. There is no aortic valve stenosis. Aortic atherosclerosis/calcification noted. No angiographically apparently CAD.     - at the gym was doing silver sneakers workout. Low key aerobic exercise, partially chair based.  - after workout was walking got suddenly dizzy. Lightheaded. Sweaty, then LOC. Out for about 1 minute. Left sided pain chest down left sharp, had been going on prior and chronic symptoms. Fatigued afterward and ever since.  - increased thirst, water 549m x 10 - symptoms typically occur with standing up.  - has had some orthostatic symptoms prior but not syncope - had been on torsemide 69mdaily.  - was so orthostatic in clinic could not get off exam table, had to send by ambulance to ER.      12/2021 ER visit - K 3.2 BUN 31 Cr 0.75 Mg 1.8  - CT head no acute issues  EKG rate controlled afib - given IVFs, KCl     - orthostatics today DBP drops 13 points with standing, HR increases 67-->90 - down to torsemide 2048maily.   -weight 206 lbs today, was 202 lbs 12/20/21  Depakote 12-25% dizziness, 1-5% orthostatic hypotension.  Gabapentin dizziness 17-28%  Imipramine: orthostatic hypotension Topamax: dizziness 14% Reglan: dizziness Jan 2023 HgbA1c 7.8, previously 8    - ongoing symptoms with standing - taking torsemide 71m17mF.  -home scales 212 lbs at peak, down to 206 lbs - no change depakote at least 1 year, gabapentin was increased about 6 months, no changes in topamax,   2. Persistent afib - some palpitations at times.  - compliant with meds     - recent palpitations.  - 7 day zio patch showed rate  controlled afib, nocturnal pauses up to 3.6 s    3. HTN We increased hydralazine to 50mg71m         Other medical issues not addressed this visit   1. LE edema/Chronic diastolic HF - 08/2081/9937 LVEF 60-65%, elevated LVEDP, mild RV dysfunction, mild to mod LAE, - has been on torsemide 60mg 21my   - working on weight loss, dieting. Down to 228 lbs. Mainly drinks a healthy drink mix she created on her own.  - some swelling at times, controlled with torsemide overall.      11/2020 echo LVEF 65-70%16-96%t diastolic, low normal RV function - swelling doing well. Taking torsemide 60mg a63montrols edema.  - most recent labs Cr was normal. Home weights 201 lbs       3. Prior ASD repair   4. Asthma/Bronchitis - recent admission 04/2021 - followed by Dr Alva   [facility] + in Jan 2022 - recurrent infection 05/2021 Past Medical History:  Diagnosis Date   Asthma    Atrial fibrillation (HCC)   [place] disorder (HCC)   Amoritaonchiectasis (HCC)    Carpal tunnel syndrome of right wrist    CHF (congestive heart failure) (HCC)    Complication of anesthesia    "[place] had an allergic reaction to the combination of the pain meds and the anesthesia" she  does not remember what happened but "I eneded up in the ICU for 5 days".   H/O congenital atrial septal defect (ASD) repair    Hypogammaglobulinemia ([place])    Hypothyroid    IgE deficiency (HCC)    Neuropathy    Neuropathy    Osteoarthritis    Pinched nerve in neck    PTSD (post-traumatic stress disorder)    Recurrent sinusitis    Short-term memory loss    Sleep apnea    TIA (transient ischemic attack)    Tremors of nervous system    seeing PCP for this.   Type 2 diabetes mellitus (HCC)    Urticaria    Wound infection      Allergies  Allergen Reactions   Ativan [Lorazepam] Hives   Phenergan [Promethazine Hcl] Hives     Current Outpatient Medications  Medication Sig Dispense Refill    acetaminophen (TYLENOL) 325 MG tablet Take 2 tablets (650 mg total) by mouth every 6 (six) hours as needed for mild pain, fever or headache. 30 tablet 1   Acetaminophen-Codeine 300-30 MG tablet acetaminophen 300 mg-codeine 30 mg tablet  TAKE 1 TABLET BY MOUTH EVERY 4 HOURS FOR UP TO 5 DAYS AS NEEDED FOR SEVERE PAIN (Patient not taking: Reported on 01/06/2022)     albuterol (PROVENTIL) (2.5 MG/3ML) 0.083% nebulizer solution Take 3 mLs (2.5 mg total) by nebulization every 4 (four) hours as needed for wheezing or shortness of breath. 75 mL 1   albuterol (VENTOLIN HFA) 108 (90 Base) MCG/ACT inhaler INHALE 2 PUFFS INTO THE LUNGS EVERY 6 HOURS AS NEEDED FOR WHEEZING OR SHORTNESS OF BREATH 18 g 1   apixaban (ELIQUIS) 5 MG TABS tablet Take 1 tablet (5 mg total) by mouth 2 (two) times daily. 180 tablet 3   atorvastatin (LIPITOR) 10 MG tablet Take 10 mg by mouth every evening.      BD PEN NEEDLE NANO 2ND GEN 32G X 4 MM MISC 1 each by Other route in the morning, at noon, in the evening, and at bedtime.     Benralizumab (FASENRA PEN) 30 MG/ML SOAJ INJECT 30MG SUBCUTANEOUSLY  EVERY 8 WEEKS 1 mL 6   bisoprolol (ZEBETA) 5 MG tablet Take 0.5 tablets (2.5 mg total) by mouth daily. 45 tablet 3   blood glucose meter kit and supplies 1 each by Other route 4 (four) times daily. Dispense based on patient and insurance preference. Use up to four times daily as directed. (FOR ICD-10 E10.9, E11.9). 1 each 0   Cholecalciferol (VITAMIN D3) 125 MCG (5000 UT) CAPS Take 1 capsule (5,000 Units total) by mouth daily. 90 capsule 0   Continuous Blood Gluc Sensor (FREESTYLE LIBRE 3 SENSOR) MISC 1 Piece by Does not apply route every 14 (fourteen) days. Place 1 sensor on the skin every 14 days. Use to check glucose continuously 6 each 2   diltiazem (CARDIZEM CD) 360 MG 24 hr capsule TAKE 1 CAPSULE(360 MG) BY MOUTH DAILY 90 capsule 3   divalproex (DEPAKOTE ER) 500 MG 24 hr tablet Take 1 tablet (500 mg total) by mouth daily AND 2 tablets  (1,000 mg total) at bedtime. 270 tablet 0   Dulaglutide (TRULICITY) 1.5 GE/3.6OQ SOPN ADMINISTER 1.5 MG UNDER THE SKIN ONCE A WEEK ON SUNDAY 2 mL 2   ELDERBERRY PO Take 4 g by mouth daily. Gummies 2g each     EPINEPHrine 0.3 mg/0.3 mL IJ SOAJ injection Inject 0.3 mg into the muscle once as needed for anaphylaxis.  ferrous sulfate 324 MG TBEC Take 324 mg by mouth daily with breakfast.     fluticasone (FLOVENT HFA) 110 MCG/ACT inhaler Inhale 2 puffs into the lungs 2 (two) times daily. 1 each 5   fluticasone furoate-vilanterol (BREO ELLIPTA) 100-25 MCG/ACT AEPB Inhale 1 puff into the lungs daily. 60 each 11   gabapentin (NEURONTIN) 600 MG tablet Take 600 mg by mouth 3 (three) times daily.     hydrALAZINE (APRESOLINE) 50 MG tablet Take 1 tablet (50 mg total) by mouth 3 (three) times daily. 270 tablet 3   hydrOXYzine (ATARAX/VISTARIL) 25 MG tablet Take 1 tablet (25 mg total) by mouth every 6 (six) hours as needed for anxiety or nausea. 30 tablet 0   imipramine (TOFRANIL) 25 MG tablet Take 25 mg by mouth at bedtime.     Immune Globulin, Human, (CUVITRU Antoine) Inject 25 mg into the skin.     Immune Globulin, Human, 4 GM/20ML SOLN Inject 100 mLs into the skin every 14 (fourteen) days.     Insulin Pen Needle (PEN NEEDLES 3/16") 31G X 5 MM MISC Use as directed with insulin pen 100 each 0   LANTUS SOLOSTAR 100 UNIT/ML Solostar Pen ADMINISTER 40 UNITS UNDER THE SKIN AT BEDTIME 30 mL 1   levalbuterol (XOPENEX HFA) 45 MCG/ACT inhaler Inhale 2 puffs into the lungs every 6 (six) hours as needed for wheezing. 1 each 5   levalbuterol (XOPENEX) 1.25 MG/3ML nebulizer solution Take 1.25 mg by nebulization every 6 (six) hours as needed for wheezing. 600 mL 5   levothyroxine (SYNTHROID, LEVOTHROID) 112 MCG tablet Take 112 mcg by mouth daily before breakfast.     lidocaine-prilocaine (EMLA) cream Apply topically.     linaclotide (LINZESS) 145 MCG CAPS capsule Take 1 capsule (145 mcg total) by mouth daily before  breakfast. 90 capsule 3   Melatonin 3 MG TABS Take 3 mg by mouth at bedtime.     metFORMIN (GLUCOPHAGE) 500 MG tablet Take 500 mg by mouth 2 (two) times daily.     metoCLOPramide (REGLAN) 5 MG tablet Take 5 mg by mouth 3 (three) times daily.     montelukast (SINGULAIR) 10 MG tablet Take 1 tablet by mouth daily.     Multiple Vitamins-Minerals (MULTIVITAMIN WITH MINERALS) tablet Take 1 tablet by mouth daily. Woman     naproxen (NAPROSYN) 500 MG tablet Take 1 tablet (500 mg total) by mouth 2 (two) times daily with a meal. 30 tablet 0   Nebulizer MISC Nebulizer tubing kit 2 each 5   omeprazole (PRILOSEC) 20 MG capsule Take 1 capsule (20 mg total) by mouth 2 (two) times daily before a meal. 30 capsule 5   ondansetron (ZOFRAN) 4 MG tablet Take 1 tablet (4 mg total) by mouth every 8 (eight) hours as needed for nausea or vomiting. 20 tablet 0   ONETOUCH ULTRA test strip 1 each by Other route in the morning, at noon, in the evening, and at bedtime.     polyethylene glycol (MIRALAX / GLYCOLAX) 17 g packet Take 17 g by mouth daily as needed. 14 each 0   Potassium Chloride ER 20 MEQ TBCR Take 20 mEq by mouth daily.     predniSONE (DELTASONE) 10 MG tablet Begin prednisone 10 mg tablets. Take 2 tablets twice a day for 3 days, then take 2 tablets once a day for 1 day, then take 1 tablet on the 5th day, then stop 15 tablet 0   topiramate (TOPAMAX) 100 MG tablet Take 1 tablet (  100 mg total) by mouth 2 (two) times daily. 180 tablet 2   torsemide (DEMADEX) 20 MG tablet Take 1 tablet (20 mg total) by mouth 3 (three) times a week. Monday-Wednesday-Friday 12 tablet 3   triamcinolone cream (KENALOG) 0.1 % Apply 1 application topically 2 (two) times daily.     vitamin B-12 (CYANOCOBALAMIN) 1000 MCG tablet Take 1,000 mcg by mouth daily.     vitamin C (ASCORBIC ACID) 500 MG tablet Take 500 mg by mouth daily. Power C immune support     Current Facility-Administered Medications  Medication Dose Route Frequency Provider  Last Rate Last Admin   Benralizumab SOSY 30 mg  30 mg Subcutaneous Q28 days Dara Hoyer, FNP   30 mg at 12/27/21 1603     Past Surgical History:  Procedure Laterality Date   ASD REPAIR  1968   BIOPSY  01/26/2021   Procedure: BIOPSY;  Surgeon: [name], DO;  [facility];  Service: Endoscopy;;  gastric   CARDIAC SURGERY     CARDIOVERSION     CARDIOVERSION N/A 08/29/2018   Procedure: CARDIOVERSION;  Surgeon: Arnoldo Lenis, MD;  Location: [facility];  Service: Endoscopy;  Laterality: N/A;   [place]   COLONOSCOPY WITH PROPOFOL N/A 01/26/2021   Procedure: COLONOSCOPY WITH PROPOFOL;  Surgeon: [name], DO;  Location: [facility];  Service: Endoscopy;  Laterality: N/A;  am appt, diabetic   ESOPHAGOGASTRODUODENOSCOPY (EGD) WITH PROPOFOL N/A 01/26/2021   Procedure: ESOPHAGOGASTRODUODENOSCOPY (EGD) WITH PROPOFOL;  Surgeon: [name], DO;  [facility];  Service: Endoscopy;  Laterality: N/A;   LEFT HEART CATH AND CORONARY ANGIOGRAPHY N/A 08/30/2019   Procedure: LEFT HEART CATH AND CORONARY ANGIOGRAPHY;  Surgeon: [name], MD;  Location: [facility];  Service: Cardiovascular;  Laterality: N/A;   NASAL SINUS SURGERY  2017   POLYPECTOMY  01/26/2021   Procedure: POLYPECTOMY;  Surgeon: [name], DO;  [facility];  Service: Endoscopy;;  colon     Allergies  Allergen Reactions   Ativan [Lorazepam] Hives   Phenergan [Promethazine Hcl] Hives      Family History  Problem Relation Age of Onset   Hypertension Mother    Stroke Mother    Kidney disease Mother    Heart attack Mother    Alcohol abuse Mother    Other Father        ?colon cancer   Kidney disease Sister    Heart disease Brother    Stroke Maternal Aunt    Heart disease Maternal Aunt    Hypertension Sister    Bipolar disorder Sister    Thyroid disease Sister    Prostate cancer Brother    Thyroid disease  Daughter    Hashimoto's thyroiditis Daughter    Celiac disease Daughter    Allergic rhinitis Daughter    Asthma Neg Hx      Social History Laura [name] reports that she quit smoking about 15 years ago. Her smoking use included cigarettes. She started smoking about 43 years ago. She has a 28.00 pack-year smoking history. She has never used smokeless tobacco. Laura [name] reports that she does not currently use alcohol.   Review of Systems CONSTITUTIONAL: No weight loss, fever, chills, weakness or fatigue.  HEENT: Eyes: No visual loss, blurred vision, double vision or yellow sclerae.No hearing loss, sneezing, congestion, runny nose or sore throat.  SKIN: No rash or itching.  CARDIOVASCULAR: per  hpi RESPIRATORY: No shortness of breath, cough or sputum.  GASTROINTESTINAL: No anorexia, nausea, vomiting or diarrhea. No abdominal pain or blood.  GENITOURINARY: No burning on urination, no polyuria NEUROLOGICAL: per hpi.  MUSCULOSKELETAL: No muscle, back pain, joint pain or stiffness.  LYMPHATICS: No enlarged nodes. No history of splenectomy.  PSYCHIATRIC: No history of depression or anxiety.  ENDOCRINOLOGIC: No reports of sweating, cold or heat intolerance. No polyuria or polydipsia.  Marland Kitchen   Physical Examination Today's Vitals   01/13/22 1148  BP: (!) 150/86  Pulse: 78  SpO2: 97%  Weight: 206 lb 12.8 oz (93.8 kg)  Height: 5' 4" (1.626 m)   Body mass index is 35.5 kg/m.  Gen: resting comfortably, no acute distress HEENT: no scleral icterus, pupils equal round and reactive, no palptable cervical adenopathy,  CV: RRR, no mr/g, no jvd Resp: Clear to auscultation bilaterally GI: abdomen is soft, non-tender, non-distended, normal bowel sounds, no hepatosplenomegaly MSK: extremities are warm, no edema.  Skin: warm, no rash Neuro:  no focal deficits Psych: appropriate affect   Diagnostic Studies 08/2019 cath LV end diastolic pressure is mildly elevated. There is no aortic valve  stenosis. Aortic atherosclerosis/calcification noted. No angiographically apparently CAD.     08/2019 echo 1. Left ventricular ejection fraction, by visual estimation, is 60 to  65%. The left ventricle has normal function. There is mildly increased  left ventricular hypertrophy.   2. Elevated left ventricular end-diastolic pressure.   3. Left ventricular diastolic function could not be evaluated.   4. Global right ventricle has mildly reduced systolic function.The right  ventricular size is normal. No increase in right ventricular wall  thickness.   5. Left atrial size was mild-moderately dilated.   6. Right atrial size was normal.   7. Moderate to severe mitral annular calcification.   8. The mitral valve is degenerative. Trace mitral valve regurgitation.   9. The tricuspid valve is grossly normal. Tricuspid valve regurgitation  is trivial.  10. The aortic valve is tricuspid. Aortic valve regurgitation is not  visualized. Mild aortic valve sclerosis without stenosis.  11. The pulmonic valve was grossly normal. Pulmonic valve regurgitation is  not visualized.  12. The inferior vena cava is normal in size with greater than 50%  respiratory variability, suggesting right atrial pressure of 3 mmHg.      Assessment and Plan   1. Orthostatic syncope - recent issues with orthostatic syncope. With recent significant weight loss and dietary changes we have scaled back her diuretic.  - we have changed her diuretic to just prn, tolerate high weights and some LE edema to avoid recurrent orthostatic syncope.      2. PAF -continue current meds     Arnoldo Lenis, M.D., F.A.C.C.

## 2022-01-13 NOTE — Patient Instructions (Signed)
Medication Instructions:  ? ?Change Toresemide to 20 mg As needed  ? ?Call to let office know if you are taking Imipraimine or Reglan  ? ?*If you need a refill on your cardiac medications before your next appointment, please call your pharmacy* ? ? ?Lab Work: ?Your physician recommends that you return for lab work in: Today  ? ?If you have labs (blood work) drawn today and your tests are completely normal, you will receive your results only by: ?MyChart Message (if you have MyChart) OR ?A paper copy in the mail ?If you have any lab test that is abnormal or we need to change your treatment, we will call you to review the results. ? ? ?Testing/Procedures: ?NONE  ? ? ?Follow-Up: ?At [facility], you and your health needs are our priority.  As part of our continuing mission to provide you with exceptional heart care, we have created designated Provider Care Teams.  These Care Teams include your primary Cardiologist (physician) and Advanced Practice Providers (APPs -  Physician Assistants and Nurse Practitioners) who all work together to provide you with the care you need, when you need it. ? ?We recommend signing up for the patient portal called "MyChart".  Sign up information is provided on this After Visit Summary.  MyChart is used to connect with patients for Virtual Visits (Telemedicine).  Patients are able to view lab/test results, encounter notes, upcoming appointments, etc.  Non-urgent messages can be sent to your provider as well.   ?To learn more about what you can do with MyChart, go to [email or web address].   ? ?Your next appointment:   ? May  ? ?The format for your next appointment:   ?In Person ? ?Provider:   ?Carlyle Dolly, MD  ? ? ?Other Instructions ?Thank you for choosing [facility]! ? ?  ?

## 2022-01-13 NOTE — Telephone Encounter (Signed)
Will forward to Dr. Branch. 

## 2022-01-13 NOTE — Telephone Encounter (Signed)
Patient called to let Dr. Harl Bowie know about two medications that were on her medication list, the first one was Tofanil, she said she has never taken that one.  The next one was Reglan, she is supposed to be taking that but she stopped taking it in June she doesn't know why.  She is going to start taking it again.   ?She also went to the lab as requested.  ?

## 2022-01-14 ENCOUNTER — Ambulatory Visit (INDEPENDENT_AMBULATORY_CARE_PROVIDER_SITE_OTHER): Payer: Medicare Other | Admitting: "Endocrinology

## 2022-01-14 ENCOUNTER — Encounter: Payer: Self-pay | Admitting: "Endocrinology

## 2022-01-14 VITALS — BP 140/78 | HR 60 | Ht 64.0 in | Wt 208.6 lb

## 2022-01-14 DIAGNOSIS — E039 Hypothyroidism, unspecified: Secondary | ICD-10-CM

## 2022-01-14 DIAGNOSIS — E559 Vitamin D deficiency, unspecified: Secondary | ICD-10-CM

## 2022-01-14 DIAGNOSIS — E1165 Type 2 diabetes mellitus with hyperglycemia: Secondary | ICD-10-CM

## 2022-01-14 DIAGNOSIS — E782 Mixed hyperlipidemia: Secondary | ICD-10-CM | POA: Diagnosis not present

## 2022-01-14 DIAGNOSIS — E66812 Obesity, class 2: Secondary | ICD-10-CM

## 2022-01-14 DIAGNOSIS — Z6835 Body mass index (BMI) 35.0-35.9, adult: Secondary | ICD-10-CM

## 2022-01-14 MED ORDER — DAPAGLIFLOZIN PROPANEDIOL 5 MG PO TABS: 5.0000 mg | ORAL_TABLET | Freq: Every day | ORAL | 2 refills | Status: DC

## 2022-01-14 MED ORDER — LANTUS SOLOSTAR 100 UNIT/ML ~~LOC~~ SOPN: 50.0000 [IU] | PEN_INJECTOR | Freq: Every day | SUBCUTANEOUS | 1 refills | Status: DC

## 2022-01-14 NOTE — Progress Notes (Signed)
? ?                                                     ?     01/14/2022, 1:30 PM ? ? ?Endocrinology follow-up note ? ? ?Subjective:  ? ? Patient ID: Laura [name], female    DOB: 1961/07/14.  ?Laura [name] was seen since 2019 for management of diabetes, also seen during her last visit in relation to her steroid exposure to rule out adrenal insufficiency. ? ?PMD: ?[name], MD. ? ? ?Past Medical History:  ?Diagnosis Date  ? Asthma   ? Atrial fibrillation ([place])   ? Bipolar disorder ([place])   ? Bronchiectasis ([place])   ? Carpal tunnel syndrome of right wrist   ? CHF (congestive heart failure) ([place])   ? Complication of anesthesia   ? "my Dr in [place] told me I had an allergic reaction to the combination of the pain meds and the anesthesia" she does not remember what happened but "I eneded up in the ICU for 5 days".  ? H/O congenital atrial septal defect (ASD) repair   ? Hypogammaglobulinemia ([place])   ? Hypothyroid   ? 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 Neuropathy   ? Neuropathy   ? Osteoarthritis   ? Pinched nerve in neck   ? PTSD (post-traumatic stress disorder)   ? Recurrent sinusitis   ? Short-term memory loss   ? Sleep apnea   ? TIA (transient ischemic attack)   ? Tremors of nervous system   ? seeing PCP for this.  ? Type 2 diabetes mellitus ([place])   ? Urticaria   ? Wound infection   ? ?Past Surgical History:  ?Procedure Laterality Date  ? ASD REPAIR  1968  ? BIOPSY  01/26/2021  ? Procedure: BIOPSY;  Surgeon: [name], DO;  [facility];  Service: Endoscopy;;  gastric  ? CARDIAC SURGERY    ? CARDIOVERSION    ? CARDIOVERSION N/A 08/29/2018  ? Procedure: CARDIOVERSION;  Surgeon: Arnoldo Lenis, MD;  [facility];  Service: Endoscopy;  Laterality: N/A;  ? [place]  ? COLONOSCOPY WITH PROPOFOL N/A 01/26/2021  ? Procedure: COLONOSCOPY WITH PROPOFOL;  Surgeon: [name], DO;  [facility];  Service: Endoscopy;  Laterality: N/A;  am  appt, diabetic  ? ESOPHAGOGASTRODUODENOSCOPY (EGD) WITH PROPOFOL N/A 01/26/2021  ? Procedure: ESOPHAGOGASTRODUODENOSCOPY (EGD) WITH PROPOFOL;  Surgeon: [name], DO;  [facility];  Service: Endoscopy;  Laterality: N/A;  ? LEFT HEART CATH AND CORONARY ANGIOGRAPHY N/A 08/30/2019  ? Procedure: LEFT HEART CATH AND CORONARY ANGIOGRAPHY;  Surgeon: [name], MD;  Location: [facility];  Service: Cardiovascular;  Laterality: N/A;  ? NASAL SINUS SURGERY  2017  ? POLYPECTOMY  01/26/2021  ? Procedure: POLYPECTOMY;  Surgeon: [name], DO;  [facility];  Service: Endoscopy;;  colon  ? ?Social History  ? ?Socioeconomic History  ? Marital status: Widowed  ?  Spouse name: Not on file  ? Number of children: Not on file  ? Years of education: Not on file  ? Highest education level: Not on file  ?Occupational History  ? Not on file  ?Tobacco Use  ? Smoking status: Former  ?  Packs/day: 1.00  ?  Years: 28.00  ?  Pack years: 28.00  ?  Types: Cigarettes  ?  Start date: 71  ?  Quit date: 2008  ?  Years since quitting: 15.2  ? Smokeless tobacco: Never  ?Vaping Use  ? Vaping Use: Never used  ?Substance and Sexual Activity  ? Alcohol use: Not Currently  ? Drug use: Not Currently  ? Sexual activity: Not Currently  ?  Birth control/protection: Post-menopausal  ?Other Topics Concern  ? Not on file  ?Social History Narrative  ? Not on file  ? ?Social Determinants of Health  ? ?Financial Resource Strain: High Risk  ? Difficulty of Paying Living Expenses: Hard  ?Food Insecurity: No Food Insecurity  ? Worried About Charity fundraiser in the Last Year: Never true  ? Ran Out of Food in the Last Year: Never true  ?Transportation Needs: No Transportation Needs  ? Lack of Transportation (Medical): No  ? Lack of Transportation (Non-Medical): No  ?Physical Activity: Insufficiently Active  ? Days of Exercise per Week: 4 days  ? Minutes of Exercise per Session: 30 min  ?Stress: No Stress Concern  Present  ? Feeling of Stress : Only a little  ?Social Connections: Moderately Integrated  ? Frequency of Communication with Friends and Family: More than three times a week  ? Frequency of Social Gatherings with Friends and Family: More than three times a week  ? Attends Religious Services: More than 4 times per year  ? Active Member of Clubs or Organizations: Yes  ? Attends Archivist Meetings: More than 4 times per year  ? Marital Status: Widowed  ? ?Outpatient Encounter Medications as of 01/14/2022  ?Medication Sig  ? dapagliflozin propanediol (FARXIGA) 5 MG TABS tablet Take 1 tablet (5 mg total) by mouth daily before breakfast.  ? acetaminophen (TYLENOL) 325 MG tablet Take 2 tablets (650 mg total) by mouth every 6 (six) hours as needed for mild pain, fever or headache.  ? Acetaminophen-Codeine 300-30 MG tablet acetaminophen 300 mg-codeine 30 mg tablet ? TAKE 1 TABLET BY MOUTH EVERY 4 HOURS FOR UP TO 5 DAYS AS NEEDED FOR SEVERE PAIN (Patient not taking: Reported on 01/06/2022)  ? albuterol (PROVENTIL) (2.5 MG/3ML) 0.083% nebulizer solution Take 3 mLs (2.5 mg total) by nebulization every 4 (four) hours as needed for wheezing or shortness of breath.  ? albuterol (VENTOLIN HFA) 108 (90 Base) MCG/ACT inhaler INHALE 2 PUFFS INTO THE LUNGS EVERY 6 HOURS AS NEEDED FOR WHEEZING OR SHORTNESS OF BREATH  ? apixaban (ELIQUIS) 5 MG TABS tablet Take 1 tablet (5 mg total) by mouth 2 (two) times daily.  ? atorvastatin (LIPITOR) 10 MG tablet Take 10 mg by mouth every evening.   ? BD PEN NEEDLE NANO 2ND GEN 32G X 4 MM MISC 1 each by Other route in the morning, at noon, in the evening, and at bedtime.  ? Benralizumab (FASENRA PEN) 30 MG/ML SOAJ INJECT 30MG SUBCUTANEOUSLY  EVERY 8 WEEKS  ? bisoprolol (ZEBETA) 5 MG tablet Take 0.5 tablets (2.5 mg total) by mouth daily.  ? blood glucose meter kit and supplies 1 each by Other route 4 (four) times daily. Dispense based on patient and insurance preference. Use up to four times  daily as directed. (FOR ICD-10 E10.9, E11.9).  ? Cholecalciferol (VITAMIN D3) 125 MCG (5000 UT) CAPS Take 1 capsule (5,000 Units total) by mouth daily.  ? Continuous Blood Gluc Sensor (FREESTYLE LIBRE 3 SENSOR) MISC 1 Piece by Does not apply route every  14 (fourteen) days. Place 1 sensor on the skin every 14 days. Use to check glucose continuously  ? diltiazem (CARDIZEM CD) 360 MG 24 hr capsule TAKE 1 CAPSULE(360 MG) BY MOUTH DAILY  ? divalproex (DEPAKOTE ER) 500 MG 24 hr tablet Take 1 tablet (500 mg total) by mouth daily AND 2 tablets (1,000 mg total) at bedtime.  ? Dulaglutide (TRULICITY) 1.5 QH/4.7ML SOPN ADMINISTER 1.5 MG UNDER THE SKIN ONCE A WEEK ON SUNDAY  ? ELDERBERRY PO Take 4 g by mouth daily. Gummies 2g each  ? EPINEPHrine 0.3 mg/0.3 mL IJ SOAJ injection Inject 0.3 mg into the muscle once as needed for anaphylaxis.  ? ferrous sulfate 324 MG TBEC Take 324 mg by mouth daily with breakfast.  ? fluticasone (FLOVENT HFA) 110 MCG/ACT inhaler Inhale 2 puffs into the lungs 2 (two) times daily.  ? fluticasone furoate-vilanterol (BREO ELLIPTA) 100-25 MCG/ACT AEPB Inhale 1 puff into the lungs daily.  ? gabapentin (NEURONTIN) 600 MG tablet Take 600 mg by mouth 3 (three) times daily.  ? hydrALAZINE (APRESOLINE) 50 MG tablet Take 1 tablet (50 mg total) by mouth 3 (three) times daily.  ? hydrOXYzine (ATARAX/VISTARIL) 25 MG tablet Take 1 tablet (25 mg total) by mouth every 6 (six) hours as needed for anxiety or nausea.  ? imipramine (TOFRANIL) 25 MG tablet Take 25 mg by mouth at bedtime.  ? Immune Globulin, Human, (CUVITRU [place]) Inject 25 mg into the skin.  ? Immune Globulin, Human, 4 GM/20ML SOLN Inject 100 mLs into the skin every 14 (fourteen) days.  ? insulin glargine (LANTUS SOLOSTAR) 100 UNIT/ML Solostar Pen Inject 50 Units into the skin at bedtime.  ? Insulin Pen Needle (PEN NEEDLES 3/16") 31G X 5 MM MISC Use as directed with insulin pen  ? levalbuterol (XOPENEX HFA) 45 MCG/ACT inhaler Inhale 2 puffs into the lungs  every 6 (six) hours as needed for wheezing.  ? levalbuterol (XOPENEX) 1.25 MG/3ML nebulizer solution Take 1.25 mg by nebulization every 6 (six) hours as needed for wheezing.  ? levothyroxine (SYNTHROID, LEV

## 2022-01-14 NOTE — Patient Instructions (Signed)

## 2022-01-15 DIAGNOSIS — D839 Common variable immunodeficiency, unspecified: Secondary | ICD-10-CM | POA: Diagnosis not present

## 2022-01-16 NOTE — Progress Notes (Signed)
Virtual Visit via Video Note ? ?I connected with Laura [name] on 01/18/22 at  1:40 PM EDT by a video enabled telemedicine application and verified that I am speaking with the correct person using two identifiers. ? ?Location: ?Patient: home ?Provider: office ?Persons participated in the visit- patient, provider  ?  ?I discussed the limitations of evaluation and management by telemedicine and the availability of in person appointments. The patient expressed understanding and agreed to proceed. ? ?  ?I discussed the assessment and treatment plan with the patient. The patient was provided an opportunity to ask questions and all were answered. The patient agreed with the plan and demonstrated an understanding of the instructions. ?  ?The patient was advised to call back or seek an in-person evaluation if the symptoms worsen or if the condition fails to improve as anticipated. ? ?I provided 13 minutes of non-face-to-face time during this encounter. ? ? ?[name], MD ? ? ? ?BH MD/PA/NP OP Progress Note ? ?01/18/2022 2:06 PM ?Laura [name]  ?MRN:  [id number] ? ?Chief Complaint:  ?Chief Complaint  ?Patient presents with  ? Follow-up  ? Depression  ? ?HPI: This is a follow-up appointment for depression.  ?She states that she is not doing well.  She fainted after going to gym.  She was found to have hypotension and A-fib.  She is seeing a cardiologist.  She got sick and had respiratory infection.  She feels frustrated as she cannot work out, although she was doing excellent prior to this.  She also complains of foot pain, the surgery of which was postponed due to her current physical condition. She reports better relationship with all of her 3 children.  She was able to see her grandson the other time.  She goes to church regularly.  Although she feels depressed, it has been manageable.  She has fair sleep.  Her weight has been fluctuating, which she attributes to torsemide.  She states that her cardiologist might  contact this writer due to concern of lipid.  Although she was informed that Depakote could contribute to weight gain, she does not think the medication is causing this, and she would like to stay at the same dose.  She denies SI.  She denies decreased need for sleep or euphonia.  ? ? ?Wt Readings from Last 3 Encounters:  ?01/14/22 208 lb 9.6 oz (94.6 kg)  ?01/13/22 206 lb 12.8 oz (93.8 kg)  ?01/06/22 207 lb 3.2 oz (94 kg)  ?  ? ?Employment: [employer], part time since October, on disability for bipolar disorder since 1997, used to work at [employer], and on medical leave since June. 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 ?Number of children: 3 in [place], [place], [place] ? ?Visit Diagnosis:  ?  ICD-10-CM   ?1. Mood disorder in conditions classified elsewhere  F06.30   ?  ?2. MDD (major depressive disorder), recurrent, in partial remission ([place])  F33.41   ?  ? ? ?Past Psychiatric History: Please see initial evaluation for full details. I have reviewed the history. No updates at this time.  ?  ? ?Past Medical History:  ?Past Medical History:  ?Diagnosis Date  ? Asthma   ? Atrial fibrillation ([place])   ? Bipolar disorder ([place])   ? Bronchiectasis ([place])   ? Carpal tunnel syndrome of right wrist   ? CHF (congestive heart failure) (  [place])   ? Complication of anesthesia   ? "my Dr in [place] told me I had an allergic reaction to the combination of the pain meds and the anesthesia" she does not remember what happened but "I eneded up in the ICU for 5 days".  ? H/O congenital atrial septal defect (ASD) repair   ? Hypogammaglobulinemia ([place])   ? Hypothyroid   ? 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 Neuropathy   ? Neuropathy   ? Osteoarthritis   ? Pinched nerve in neck   ? PTSD (post-traumatic stress disorder)   ? Recurrent sinusitis   ? Short-term memory loss   ? Sleep apnea   ? TIA (transient ischemic  attack)   ? Tremors of nervous system   ? seeing PCP for this.  ? Type 2 diabetes mellitus ([place])   ? Urticaria   ? Wound infection   ?  ?Past Surgical History:  ?Procedure Laterality Date  ? ASD REPAIR  1968  ? BIOPSY  01/26/2021  ? Procedure: BIOPSY;  Surgeon: [name], DO;  [facility];  Service: Endoscopy;;  gastric  ? CARDIAC SURGERY    ? CARDIOVERSION    ? CARDIOVERSION N/A 08/29/2018  ? Procedure: CARDIOVERSION;  Surgeon: Arnoldo Lenis, MD;  [facility];  Service: Endoscopy;  Laterality: N/A;  ? [place]  ? COLONOSCOPY WITH PROPOFOL N/A 01/26/2021  ? Procedure: COLONOSCOPY WITH PROPOFOL;  Surgeon: [name], DO;  [facility];  Service: Endoscopy;  Laterality: N/A;  am appt, diabetic  ? ESOPHAGOGASTRODUODENOSCOPY (EGD) WITH PROPOFOL N/A 01/26/2021  ? Procedure: ESOPHAGOGASTRODUODENOSCOPY (EGD) WITH PROPOFOL;  Surgeon: [name], DO;  [facility];  Service: Endoscopy;  Laterality: N/A;  ? LEFT HEART CATH AND CORONARY ANGIOGRAPHY N/A 08/30/2019  ? Procedure: LEFT HEART CATH AND CORONARY ANGIOGRAPHY;  Surgeon: [name], MD;  Location: [facility];  Service: Cardiovascular;  Laterality: N/A;  ? NASAL SINUS SURGERY  2017  ? POLYPECTOMY  01/26/2021  ? Procedure: POLYPECTOMY;  Surgeon: [name], DO;  [facility];  Service: Endoscopy;;  colon  ? ? ?Family Psychiatric History: Please see initial evaluation for full details. I have reviewed the history. No updates at this time.  ?  ? ?Family History:  ?Family History  ?Problem Relation Age of Onset  ? Hypertension Mother   ? Stroke Mother   ? Kidney disease Mother   ? Heart attack Mother   ? Alcohol abuse Mother   ? Other Father   ?     ?colon cancer  ? Kidney disease Sister   ? Heart disease Brother   ? Stroke Maternal Aunt   ? Heart disease Maternal Aunt   ? Hypertension Sister   ? Bipolar disorder Sister   ? Thyroid disease Sister    ? Prostate cancer Brother   ? Thyroid disease Daughter   ? Hashimoto's thyroiditis Daughter   ? Celiac disease Daughter   ? Allergic rhinitis Daughter   ? Asthma Neg Hx   ? ? ?Social History:  ?Social History  ? ?Socioeconomic History  ? Marital status: Widowed  ?  Spouse name: Not on file  ? Number of children: Not on file  ? Years of education: Not on file  ? Highest education level: Not on file  ?Occupational History  ? Not on file  ?Tobacco Use  ? Smoking status: Former  ?  Packs/day:  1.00  ?  Years: 28.00  ?  Pack years: 28.00  ?  Types: Cigarettes  ?  Start date: 89  ?  Quit date: 2008  ?  Years since quitting: 15.2  ? Smokeless tobacco: Never  ?Vaping Use  ? Vaping Use: Never used  ?Substance and Sexual Activity  ? Alcohol use: Not Currently  ? Drug use: Not Currently  ? Sexual activity: Not Currently  ?  Birth control/protection: Post-menopausal  ?Other Topics Concern  ? Not on file  ?Social History Narrative  ? Not on file  ? ?Social Determinants of Health  ? ?Financial Resource Strain: High Risk  ? Difficulty of Paying Living Expenses: Hard  ?Food Insecurity: No Food Insecurity  ? Worried About Charity fundraiser in the Last Year: Never true  ? Ran Out of Food in the Last Year: Never true  ?Transportation Needs: No Transportation Needs  ? Lack of Transportation (Medical): No  ? Lack of Transportation (Non-Medical): No  ?Physical Activity: Insufficiently Active  ? Days of Exercise per Week: 4 days  ? Minutes of Exercise per Session: 30 min  ?Stress: No Stress Concern Present  ? Feeling of Stress : Only a little  ?Social Connections: Moderately Integrated  ? Frequency of Communication with Friends and Family: More than three times a week  ? Frequency of Social Gatherings with Friends and Family: More than three times a week  ? Attends Religious Services: More than 4 times per year  ? Active Member of Clubs or Organizations: Yes  ? Attends Archivist Meetings: More than 4 times per year  ?  Marital Status: Widowed  ? ? ?Allergies:  ?Allergies  ?Allergen Reactions  ? Ativan [Lorazepam] Hives  ? Phenergan [Promethazine Hcl] Hives  ? ? ?Metabolic Disorder Labs: ?Lab Results  ?Component Value Date  ? HGB

## 2022-01-17 ENCOUNTER — Telehealth: Payer: Self-pay | Admitting: Cardiology

## 2022-01-17 ENCOUNTER — Telehealth: Payer: Self-pay | Admitting: "Endocrinology

## 2022-01-17 NOTE — Telephone Encounter (Signed)
I gave Dr.Nida her readings. ?

## 2022-01-17 NOTE — Telephone Encounter (Signed)
Pt c/o swelling: STAT is pt has developed SOB within 24 hours ? ?How much weight have you gained and in what time span?  ?Patient states she gained 4 lbs over night  ? ?If swelling, where is the swelling located?  ?No swelling  ? ?Are you currently taking a fluid pill?  ?No  ? ?Are you currently SOB?  ?No  ? ?Do you have a log of your daily weights (if so, list)?  ?4/06: 206.2 ?4/07: 207.4 ?4/08: 205.4 ?4/09: 204 ?4/10: 208.2 ? ? ?Have you gained 3 pounds in a day or 5 pounds in a week?  ?Patient states she gained 4 lbs in 1 days  ? ?Have you traveled recently?  ?No ? ?Patient also wanted to mention she saw endocrinologist on Friday, 3/07 and per patient he did not want to alter BP medication, but increased Hydralazine from 25 MG to 50 MG ?BP 167/75 69.  ?Patient also states she had a fall on Saturday, 4/08. ? ? ?

## 2022-01-17 NOTE — Telephone Encounter (Signed)
Patient called and asked if you could pull her Libre Readings for Dr Dorris Fetch and attach it to her after hours line information from the weekend. We placed that in your box, please advise ?

## 2022-01-18 ENCOUNTER — Encounter: Payer: Self-pay | Admitting: Psychiatry

## 2022-01-18 ENCOUNTER — Other Ambulatory Visit: Payer: Self-pay

## 2022-01-18 ENCOUNTER — Telehealth (INDEPENDENT_AMBULATORY_CARE_PROVIDER_SITE_OTHER): Payer: Medicare Other | Admitting: Psychiatry

## 2022-01-18 DIAGNOSIS — F0630 Mood disorder due to known physiological condition, unspecified: Secondary | ICD-10-CM | POA: Diagnosis not present

## 2022-01-18 DIAGNOSIS — F3341 Major depressive disorder, recurrent, in partial remission: Secondary | ICD-10-CM

## 2022-01-18 DIAGNOSIS — S8001XA Contusion of right knee, initial encounter: Secondary | ICD-10-CM | POA: Diagnosis not present

## 2022-01-18 DIAGNOSIS — S8002XA Contusion of left knee, initial encounter: Secondary | ICD-10-CM | POA: Diagnosis not present

## 2022-01-18 MED ORDER — TORSEMIDE 20 MG PO TABS: ORAL_TABLET | ORAL | 3 refills | Status: DC

## 2022-01-18 MED ORDER — DIVALPROEX SODIUM ER 500 MG PO TB24: ORAL_TABLET | ORAL | 0 refills | Status: DC

## 2022-01-18 NOTE — Telephone Encounter (Signed)
Discussed with pt, understanding voiced. 

## 2022-01-18 NOTE — Telephone Encounter (Signed)
Attempted to call pt, no answer. 

## 2022-01-18 NOTE — Telephone Encounter (Signed)
Patient is calling back , she said she has not heard back in regards to her high readings & she is not sure what she needs to do as far as medication. ?

## 2022-01-18 NOTE — Telephone Encounter (Signed)
[place] with weight gain, expected as her body fully hydrates should gain some water weight. Continue to monitor weights, if notices any signicant swelling can take torsemide prn. ? ?Zandra Abts MD ?

## 2022-01-18 NOTE — Telephone Encounter (Signed)
Pt notified and verbalized understanding. Pt had no further questions or concerns.  ?

## 2022-01-18 NOTE — Telephone Encounter (Signed)
Patient returned call. Call transferred to [facility], [place]. ?

## 2022-01-18 NOTE — Patient Instructions (Signed)
Continue Depakote 500 mg in AM, 1000 mg at night  ?Next appointment: 7/6 at 1 PM  ?

## 2022-01-19 ENCOUNTER — Other Ambulatory Visit: Payer: Self-pay | Admitting: "Endocrinology

## 2022-01-21 ENCOUNTER — Other Ambulatory Visit: Payer: Self-pay | Admitting: Psychiatry

## 2022-01-25 ENCOUNTER — Other Ambulatory Visit: Payer: Self-pay | Admitting: *Deleted

## 2022-01-25 MED ORDER — TORSEMIDE 20 MG PO TABS: ORAL_TABLET | ORAL | 3 refills | Status: DC

## 2022-01-27 ENCOUNTER — Encounter: Payer: Medicare Other | Admitting: Podiatry

## 2022-01-27 DIAGNOSIS — E1165 Type 2 diabetes mellitus with hyperglycemia: Secondary | ICD-10-CM | POA: Diagnosis not present

## 2022-01-28 ENCOUNTER — Ambulatory Visit (INDEPENDENT_AMBULATORY_CARE_PROVIDER_SITE_OTHER): Payer: Medicare Other | Admitting: "Endocrinology

## 2022-01-28 ENCOUNTER — Encounter: Payer: Self-pay | Admitting: "Endocrinology

## 2022-01-28 VITALS — BP 130/76 | HR 52 | Ht 64.0 in | Wt 206.0 lb

## 2022-01-28 DIAGNOSIS — E039 Hypothyroidism, unspecified: Secondary | ICD-10-CM

## 2022-01-28 DIAGNOSIS — E782 Mixed hyperlipidemia: Secondary | ICD-10-CM

## 2022-01-28 DIAGNOSIS — E559 Vitamin D deficiency, unspecified: Secondary | ICD-10-CM

## 2022-01-28 DIAGNOSIS — E1165 Type 2 diabetes mellitus with hyperglycemia: Secondary | ICD-10-CM | POA: Diagnosis not present

## 2022-01-28 MED ORDER — LANTUS SOLOSTAR 100 UNIT/ML ~~LOC~~ SOPN: 70.0000 [IU] | PEN_INJECTOR | Freq: Every day | SUBCUTANEOUS | 1 refills | Status: DC

## 2022-01-28 NOTE — Progress Notes (Signed)
? ?                                                     ?     01/28/2022, 2:35 PM ? ? ?Endocrinology follow-up note ? ? ?Subjective:  ? ? Patient ID: Laura [name], female    DOB: [birth year].  ?Laura [name] was seen since 2019 for management of diabetes, also seen during her last visit in relation to her steroid exposure to rule out adrenal insufficiency. ? ?PMD: ?[name], MD. ? ? ?Past Medical History:  ?Diagnosis Date  ? Asthma   ? Atrial fibrillation ([place])   ? Bipolar disorder ([place])   ? Bronchiectasis ([place])   ? Carpal tunnel syndrome of right wrist   ? CHF (congestive heart failure) ([place])   ? Complication of anesthesia   ? "my Dr in [place] told me I had an allergic reaction to the combination of the pain meds and the anesthesia" she does not remember what happened but "I eneded up in the ICU for 5 days".  ? H/O congenital atrial septal defect (ASD) repair   ? Hypogammaglobulinemia ([place])   ? Hypothyroid   ? 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 Neuropathy   ? Neuropathy   ? Osteoarthritis   ? Pinched nerve in neck   ? PTSD (post-traumatic stress disorder)   ? Recurrent sinusitis   ? Short-term memory loss   ? Sleep apnea   ? TIA (transient ischemic attack)   ? Tremors of nervous system   ? seeing PCP for this.  ? Type 2 diabetes mellitus ([place])   ? Urticaria   ? Wound infection   ? ?Past Surgical History:  ?Procedure Laterality Date  ? ASD REPAIR  1968  ? BIOPSY  01/26/2021  ? Procedure: BIOPSY;  Surgeon: [name], DO;  [facility];  Service: Endoscopy;;  gastric  ? CARDIAC SURGERY    ? CARDIOVERSION    ? CARDIOVERSION N/A 08/29/2018  ? Procedure: CARDIOVERSION;  Surgeon: Arnoldo Lenis, MD;  [facility];  Service: Endoscopy;  Laterality: N/A;  ? [place]  ? COLONOSCOPY WITH PROPOFOL N/A 01/26/2021  ? Procedure: COLONOSCOPY WITH PROPOFOL;  Surgeon: [name], DO;  [facility];  Service: Endoscopy;  Laterality: N/A;  am  appt, diabetic  ? ESOPHAGOGASTRODUODENOSCOPY (EGD) WITH PROPOFOL N/A 01/26/2021  ? Procedure: ESOPHAGOGASTRODUODENOSCOPY (EGD) WITH PROPOFOL;  Surgeon: [name], DO;  [facility];  Service: Endoscopy;  Laterality: N/A;  ? LEFT HEART CATH AND CORONARY ANGIOGRAPHY N/A 08/30/2019  ? Procedure: LEFT HEART CATH AND CORONARY ANGIOGRAPHY;  Surgeon: [name], MD;  Location: [facility];  Service: Cardiovascular;  Laterality: N/A;  ? NASAL SINUS SURGERY  2017  ? POLYPECTOMY  01/26/2021  ? Procedure: POLYPECTOMY;  Surgeon: [name], DO;  [facility];  Service: Endoscopy;;  colon  ? ?Social History  ? ?Socioeconomic History  ? Marital status: Widowed  ?  Spouse name: Not on file  ? Number of children: Not on file  ? Years of education: Not on file  ? Highest education level: Not on file  ?Occupational History  ? Not on file  ?Tobacco Use  ? Smoking status: Former  ?  Packs/day: 1.00  ?  Years: 28.00  ?  Pack years: 28.00  ?  Types: Cigarettes  ?  Start date: 39  ?  Quit date: 2008  ?  Years since quitting: 15.3  ? Smokeless tobacco: Never  ?Vaping Use  ? Vaping Use: Never used  ?Substance and Sexual Activity  ? Alcohol use: Not Currently  ? Drug use: Not Currently  ? Sexual activity: Not Currently  ?  Birth control/protection: Post-menopausal  ?Other Topics Concern  ? Not on file  ?Social History Narrative  ? Not on file  ? ?Social Determinants of Health  ? ?Financial Resource Strain: High Risk  ? Difficulty of Paying Living Expenses: Hard  ?Food Insecurity: No Food Insecurity  ? Worried About Charity fundraiser in the Last Year: Never true  ? Ran Out of Food in the Last Year: Never true  ?Transportation Needs: No Transportation Needs  ? Lack of Transportation (Medical): No  ? Lack of Transportation (Non-Medical): No  ?Physical Activity: Insufficiently Active  ? Days of Exercise per Week: 4 days  ? Minutes of Exercise per Session: 30 min  ?Stress: No Stress Concern  Present  ? Feeling of Stress : Only a little  ?Social Connections: Moderately Integrated  ? Frequency of Communication with Friends and Family: More than three times a week  ? Frequency of Social Gatherings with Friends and Family: More than three times a week  ? Attends Religious Services: More than 4 times per year  ? Active Member of Clubs or Organizations: Yes  ? Attends Archivist Meetings: More than 4 times per year  ? Marital Status: Widowed  ? ?Outpatient Encounter Medications as of 01/28/2022  ?Medication Sig  ? acetaminophen (TYLENOL) 325 MG tablet Take 2 tablets (650 mg total) by mouth every 6 (six) hours as needed for mild pain, fever or headache.  ? Acetaminophen-Codeine 300-30 MG tablet acetaminophen 300 mg-codeine 30 mg tablet ? TAKE 1 TABLET BY MOUTH EVERY 4 HOURS FOR UP TO 5 DAYS AS NEEDED FOR SEVERE PAIN (Patient not taking: Reported on 01/06/2022)  ? albuterol (PROVENTIL) (2.5 MG/3ML) 0.083% nebulizer solution Take 3 mLs (2.5 mg total) by nebulization every 4 (four) hours as needed for wheezing or shortness of breath.  ? albuterol (VENTOLIN HFA) 108 (90 Base) MCG/ACT inhaler INHALE 2 PUFFS INTO THE LUNGS EVERY 6 HOURS AS NEEDED FOR WHEEZING OR SHORTNESS OF BREATH  ? apixaban (ELIQUIS) 5 MG TABS tablet Take 1 tablet (5 mg total) by mouth 2 (two) times daily.  ? atorvastatin (LIPITOR) 10 MG tablet Take 10 mg by mouth every evening.   ? BD PEN NEEDLE NANO 2ND GEN 32G X 4 MM MISC 1 each by Other route in the morning, at noon, in the evening, and at bedtime.  ? Benralizumab (FASENRA PEN) 30 MG/ML SOAJ INJECT $RemoveBef'30MG'[id number]$  SUBCUTANEOUSLY  EVERY 8 WEEKS  ? bisoprolol (ZEBETA) 5 MG tablet Take 0.5 tablets (2.5 mg total) by mouth daily.  ? blood glucose meter kit and supplies 1 each by Other route 4 (four) times daily. Dispense based on patient and insurance preference. Use up to four times daily as directed. (FOR ICD-10 E10.9, E11.9).  ? Cholecalciferol (VITAMIN D3) 125 MCG (5000 UT) CAPS Take 1 capsule  (5,000 Units total) by mouth daily.  ? Continuous Blood Gluc Sensor (FREESTYLE LIBRE 3 SENSOR) MISC 1 Piece by Does not apply route every 14 (fourteen) days. Place 1 sensor on the skin every 14 days. Use to check glucose continuously  ? dapagliflozin  propanediol (FARXIGA) 5 MG TABS tablet Take 1 tablet (5 mg total) by mouth daily before breakfast.  ? diltiazem (CARDIZEM CD) 360 MG 24 hr capsule TAKE 1 CAPSULE(360 MG) BY MOUTH DAILY  ? divalproex (DEPAKOTE ER) 500 MG 24 hr tablet Take 1 tablet (500 mg total) by mouth daily AND 2 tablets (1,000 mg total) at bedtime.  ? ELDERBERRY PO Take 4 g by mouth daily. Gummies 2g each  ? EPINEPHrine 0.3 mg/0.3 mL IJ SOAJ injection Inject 0.3 mg into the muscle once as needed for anaphylaxis.  ? ferrous sulfate 324 MG TBEC Take 324 mg by mouth daily with breakfast.  ? fluticasone (FLOVENT HFA) 110 MCG/ACT inhaler Inhale 2 puffs into the lungs 2 (two) times daily.  ? fluticasone furoate-vilanterol (BREO ELLIPTA) 100-25 MCG/ACT AEPB Inhale 1 puff into the lungs daily.  ? gabapentin (NEURONTIN) 600 MG tablet Take 600 mg by mouth 3 (three) times daily.  ? hydrALAZINE (APRESOLINE) 50 MG tablet Take 1 tablet (50 mg total) by mouth 3 (three) times daily.  ? hydrOXYzine (ATARAX/VISTARIL) 25 MG tablet Take 1 tablet (25 mg total) by mouth every 6 (six) hours as needed for anxiety or nausea.  ? imipramine (TOFRANIL) 25 MG tablet Take 25 mg by mouth at bedtime.  ? Immune Globulin, Human, (CUVITRU [place]) Inject 25 mg into the skin.  ? Immune Globulin, Human, 4 GM/20ML SOLN Inject 100 mLs into the skin every 14 (fourteen) days.  ? insulin glargine (LANTUS SOLOSTAR) 100 UNIT/ML Solostar Pen Inject 70 Units into the skin at bedtime.  ? Insulin Pen Needle (PEN NEEDLES 3/16") 31G X 5 MM MISC Use as directed with insulin pen  ? levalbuterol (XOPENEX HFA) 45 MCG/ACT inhaler Inhale 2 puffs into the lungs every 6 (six) hours as needed for wheezing.  ? levalbuterol (XOPENEX) 1.25 MG/3ML nebulizer solution  Take 1.25 mg by nebulization every 6 (six) hours as needed for wheezing.  ? levothyroxine (SYNTHROID, LEVOTHROID) 112 MCG tablet Take 112 mcg by mouth daily before breakfast.  ? lidocaine-prilocaine (EML

## 2022-01-28 NOTE — Patient Instructions (Signed)

## 2022-01-29 DIAGNOSIS — D839 Common variable immunodeficiency, unspecified: Secondary | ICD-10-CM | POA: Diagnosis not present

## 2022-01-31 ENCOUNTER — Telehealth: Payer: Self-pay | Admitting: *Deleted

## 2022-01-31 ENCOUNTER — Emergency Department (HOSPITAL_COMMUNITY): Payer: Medicare Other

## 2022-01-31 ENCOUNTER — Ambulatory Visit (HOSPITAL_COMMUNITY)
Admission: RE | Admit: 2022-01-31 | Discharge: 2022-01-31 | Disposition: A | Discharge status: Home / Self Care | Payer: Medicare Other | Source: Ambulatory Visit | Attending: Internal Medicine | Admitting: Internal Medicine

## 2022-01-31 ENCOUNTER — Ambulatory Visit (INDEPENDENT_AMBULATORY_CARE_PROVIDER_SITE_OTHER): Payer: Medicare Other | Admitting: Family Medicine

## 2022-01-31 ENCOUNTER — Other Ambulatory Visit: Payer: Self-pay

## 2022-01-31 ENCOUNTER — Encounter (HOSPITAL_COMMUNITY): Payer: Self-pay | Admitting: *Deleted

## 2022-01-31 ENCOUNTER — Encounter: Payer: Self-pay | Admitting: Family Medicine

## 2022-01-31 ENCOUNTER — Emergency Department (HOSPITAL_COMMUNITY)
Admission: EM | Admit: 2022-01-31 | Discharge: 2022-02-01 | Disposition: A | Discharge status: Home / Self Care | Payer: Medicare Other | Attending: Emergency Medicine | Admitting: Emergency Medicine

## 2022-01-31 DIAGNOSIS — J45909 Unspecified asthma, uncomplicated: Secondary | ICD-10-CM | POA: Insufficient documentation

## 2022-01-31 DIAGNOSIS — J40 Bronchitis, not specified as acute or chronic: Secondary | ICD-10-CM | POA: Insufficient documentation

## 2022-01-31 DIAGNOSIS — J4551 Severe persistent asthma with (acute) exacerbation: Secondary | ICD-10-CM

## 2022-01-31 DIAGNOSIS — J9811 Atelectasis: Secondary | ICD-10-CM | POA: Diagnosis not present

## 2022-01-31 DIAGNOSIS — Z7951 Long term (current) use of inhaled steroids: Secondary | ICD-10-CM | POA: Diagnosis not present

## 2022-01-31 DIAGNOSIS — R051 Acute cough: Secondary | ICD-10-CM

## 2022-01-31 DIAGNOSIS — K219 Gastro-esophageal reflux disease without esophagitis: Secondary | ICD-10-CM | POA: Diagnosis not present

## 2022-01-31 DIAGNOSIS — Z20822 Contact with and (suspected) exposure to covid-19: Secondary | ICD-10-CM | POA: Diagnosis not present

## 2022-01-31 DIAGNOSIS — Z1231 Encounter for screening mammogram for malignant neoplasm of breast: Secondary | ICD-10-CM | POA: Insufficient documentation

## 2022-01-31 DIAGNOSIS — R0602 Shortness of breath: Secondary | ICD-10-CM | POA: Diagnosis not present

## 2022-01-31 DIAGNOSIS — Z7952 Long term (current) use of systemic steroids: Secondary | ICD-10-CM | POA: Insufficient documentation

## 2022-01-31 DIAGNOSIS — R790 Abnormal level of blood mineral: Secondary | ICD-10-CM | POA: Insufficient documentation

## 2022-01-31 DIAGNOSIS — Z7901 Long term (current) use of anticoagulants: Secondary | ICD-10-CM | POA: Insufficient documentation

## 2022-01-31 DIAGNOSIS — D839 Common variable immunodeficiency, unspecified: Secondary | ICD-10-CM | POA: Diagnosis not present

## 2022-01-31 DIAGNOSIS — R059 Cough, unspecified: Secondary | ICD-10-CM | POA: Diagnosis not present

## 2022-01-31 LAB — COMPREHENSIVE METABOLIC PANEL
ALT: 10 U/L (ref 0–44)
AST: 11 U/L — ABNORMAL LOW (ref 15–41)
Albumin: 3.6 g/dL (ref 3.5–5.0)
Alkaline Phosphatase: 65 U/L (ref 38–126)
Anion gap: 7 (ref 5–15)
BUN: 24 mg/dL — ABNORMAL HIGH (ref 6–20)
CO2: 21 mmol/L — ABNORMAL LOW (ref 22–32)
Calcium: 8.7 mg/dL — ABNORMAL LOW (ref 8.9–10.3)
Chloride: 111 mmol/L (ref 98–111)
Creatinine, Ser: 0.59 mg/dL (ref 0.44–1.00)
GFR, Estimated: >60 mL/min (ref >60)
Glucose, Bld: 122 mg/dL — ABNORMAL HIGH (ref 70–99)
Potassium: 4 mmol/L (ref 3.5–5.1)
Sodium: 139 mmol/L (ref 135–145)
Total Bilirubin: 0.4 mg/dL (ref 0.3–1.2)
Total Protein: 7.6 g/dL (ref 6.5–8.1)

## 2022-01-31 LAB — CBC WITH DIFFERENTIAL/PLATELET
Abs Immature Granulocytes: 0.07 10*3/uL (ref 0.00–0.07)
Basophils Absolute: 0 10*3/uL (ref 0.0–0.1)
Basophils Relative: 1 %
Eosinophils Absolute: 0 10*3/uL (ref 0.0–0.5)
Eosinophils Relative: 0 %
HCT: 36.4 % (ref 36.0–46.0)
Hemoglobin: 12.1 g/dL (ref 12.0–15.0)
Immature Granulocytes: 1 %
Lymphocytes Relative: 28 %
Lymphs Abs: 1.4 10*3/uL (ref 0.7–4.0)
MCH: 31.9 pg (ref 26.0–34.0)
MCHC: 33.2 g/dL (ref 30.0–36.0)
MCV: 96 fL (ref 80.0–100.0)
Monocytes Absolute: 0.7 10*3/uL (ref 0.1–1.0)
Monocytes Relative: 14 %
Neutro Abs: 2.7 10*3/uL (ref 1.7–7.7)
Neutrophils Relative %: 56 %
Platelets: 156 10*3/uL (ref 150–400)
RBC: 3.79 MIL/uL — ABNORMAL LOW (ref 3.87–5.11)
RDW: 13.5 % (ref 11.5–15.5)
WBC: 4.9 10*3/uL (ref 4.0–10.5)
nRBC: 0 % (ref 0.0–0.2)

## 2022-01-31 LAB — RESP PANEL BY RT-PCR (FLU A&B, COVID) ARPGX2
Influenza A by PCR: NEGATIVE
Influenza B by PCR: NEGATIVE
SARS Coronavirus 2 by RT PCR: NEGATIVE

## 2022-01-31 LAB — MAGNESIUM: Magnesium: 2.2 mg/dL (ref 1.7–2.4)

## 2022-01-31 LAB — BRAIN NATRIURETIC PEPTIDE: B Natriuretic Peptide: 232 pg/mL — ABNORMAL HIGH (ref 0.0–100.0)

## 2022-01-31 LAB — D-DIMER, QUANTITATIVE: D-Dimer, Quant: <0.27 ug/mL-FEU (ref 0.00–0.50)

## 2022-01-31 IMAGING — MG MM DIGITAL SCREENING BILAT W/ TOMO AND CAD
8 of 15 series · 8 of 40 positions shown · non-contrast
Comparison: Previous exam(s).

CLINICAL DATA: Screening.

EXAM:
DIGITAL SCREENING BILATERAL MAMMOGRAM WITH TOMOSYNTHESIS AND CAD
TECHNIQUE: Bilateral screening digital craniocaudal and mediolateral oblique
mammograms were obtained. Bilateral screening digital breast
tomosynthesis was performed. The images were evaluated with
computer-aided detection.

[R MLO synth-2D (1 of 2)]
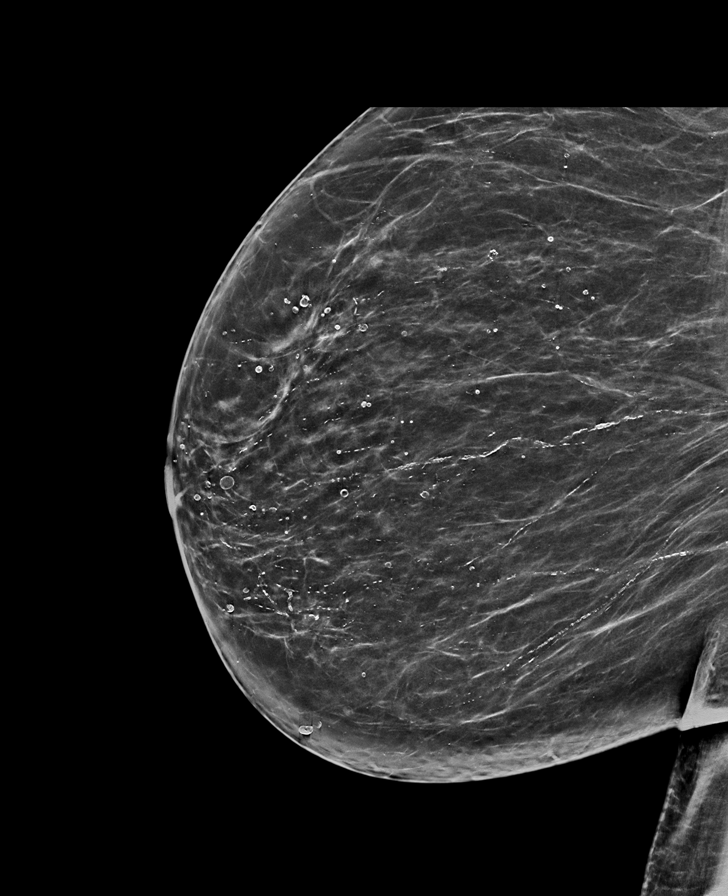

[L MLO synth-2D]
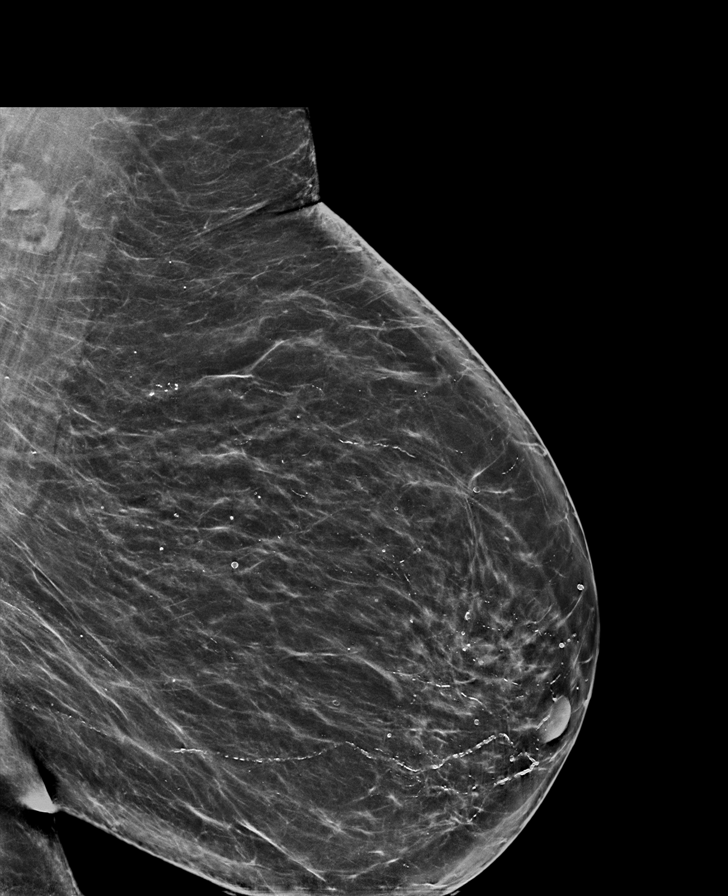

[R MLO synth-2D (2 of 2)]
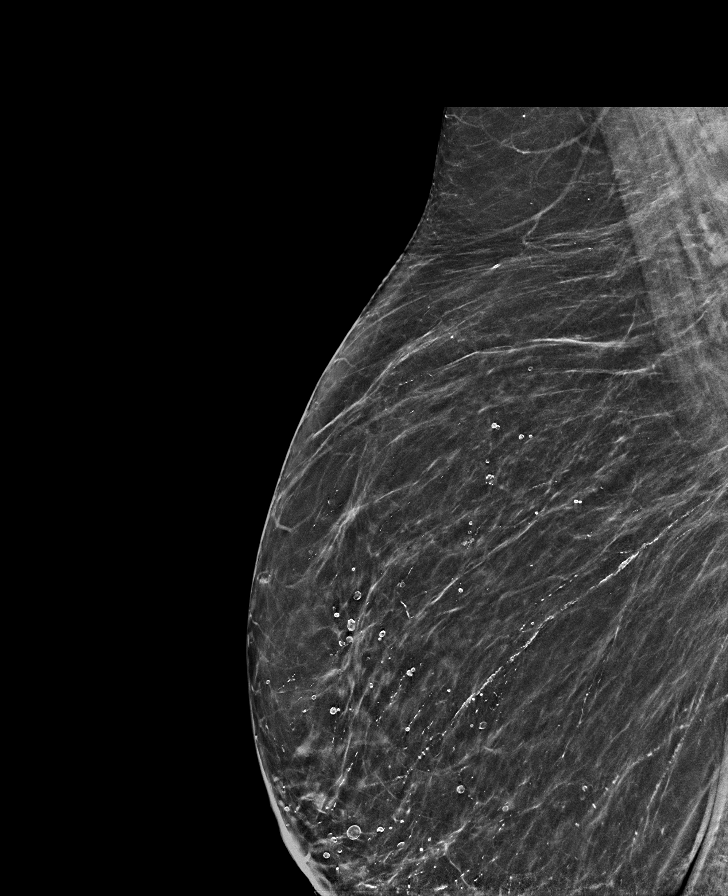

[R CC synth-2D (1 of 2)]
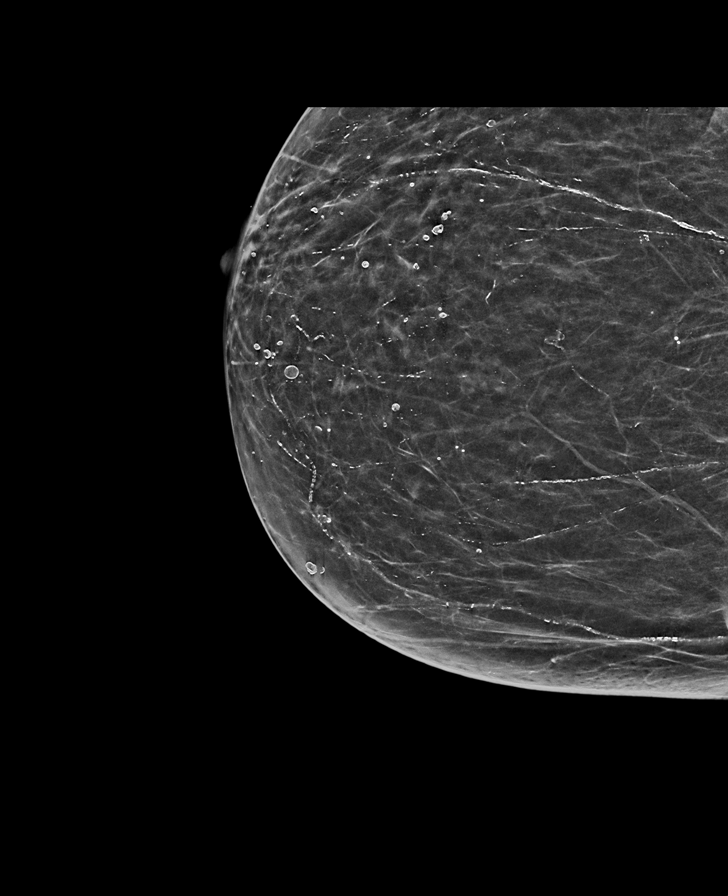

[L CC synth-2D (1 of 2)]
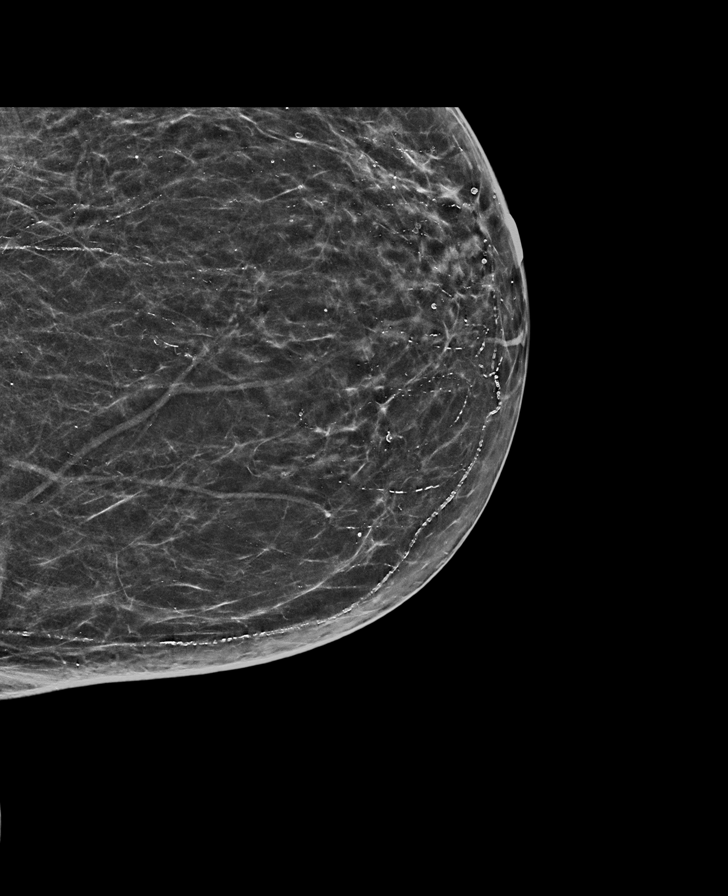

[L CC synth-2D (2 of 2)]
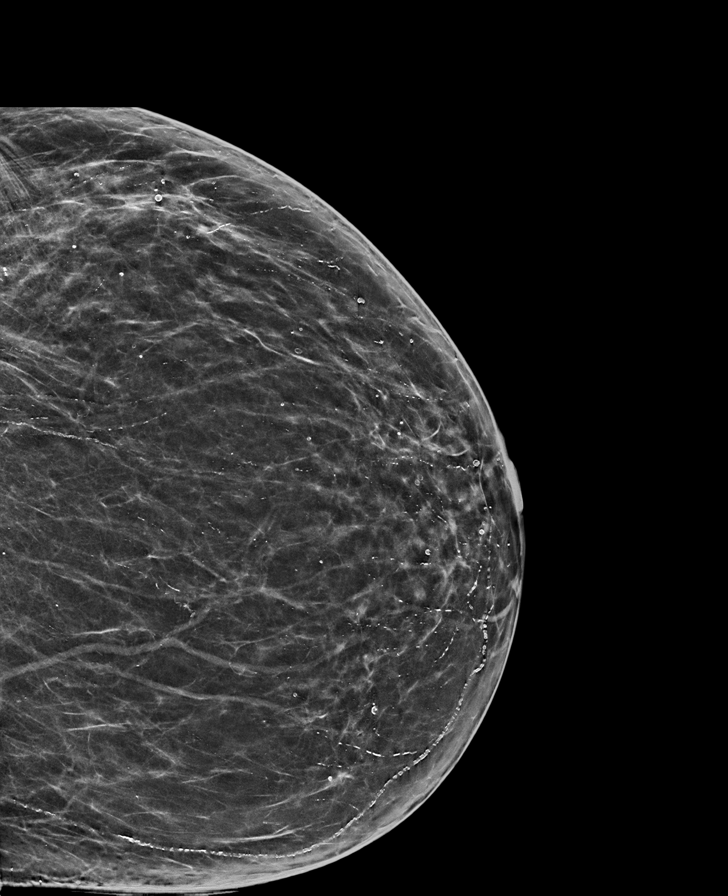

[R CC synth-2D (2 of 2)]
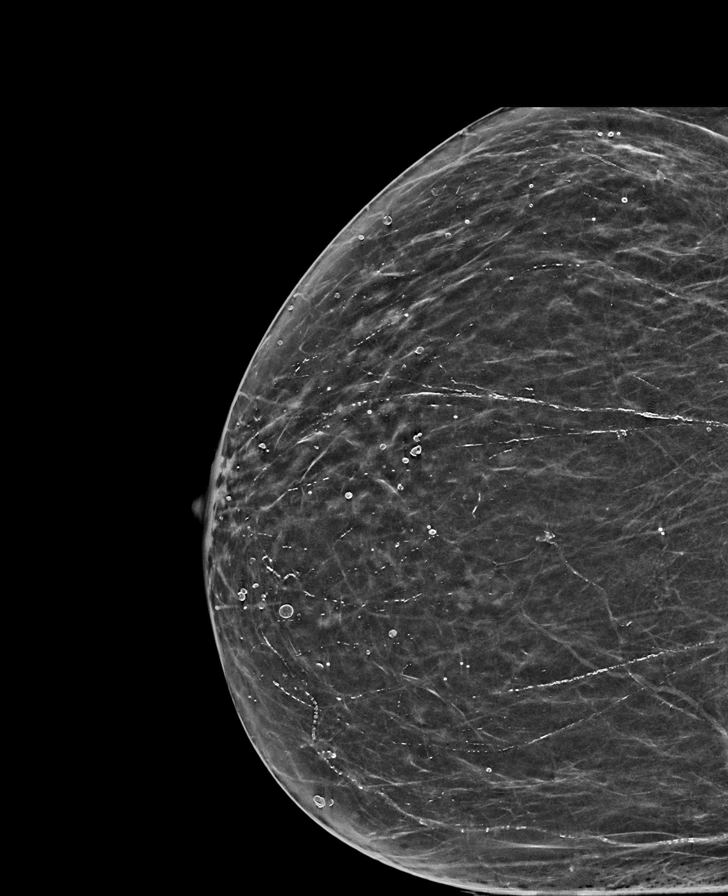

[L MLO tomo · tomo slice 60/88.0]
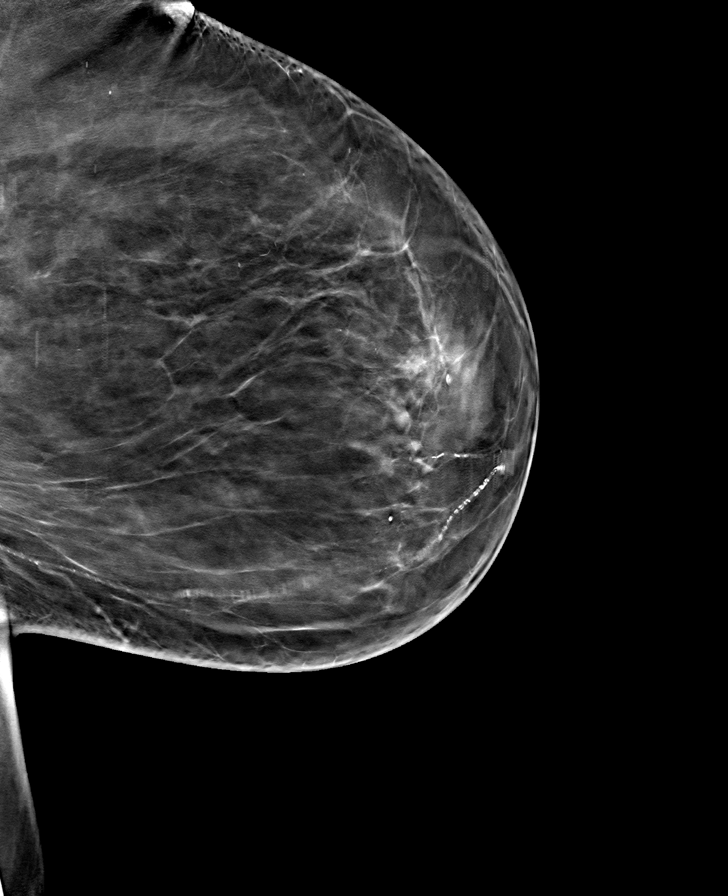

[8 of 40 positions shown; findings below may reference images not displayed]

ACR Breast Density Category b: There are scattered areas of
fibroglandular density.
FINDINGS: There are no findings suspicious for malignancy.
IMPRESSION: No mammographic evidence of malignancy. A result letter of this
screening mammogram will be mailed directly to the patient.

RECOMMENDATION:
Screening mammogram in one year. (Code:[BY])

BI-RADS CATEGORY  1: Negative.

## 2022-01-31 IMAGING — DX DG CHEST 2V
2 series · 2 of 2 positions shown · non-contrast
Comparison: [DATE]

CLINICAL DATA: Sob, Congestion, Productive cough, x since [REDACTED] Hx
of asthma, CHFsob

EXAM:
CHEST - 2 VIEW

[chest pa]
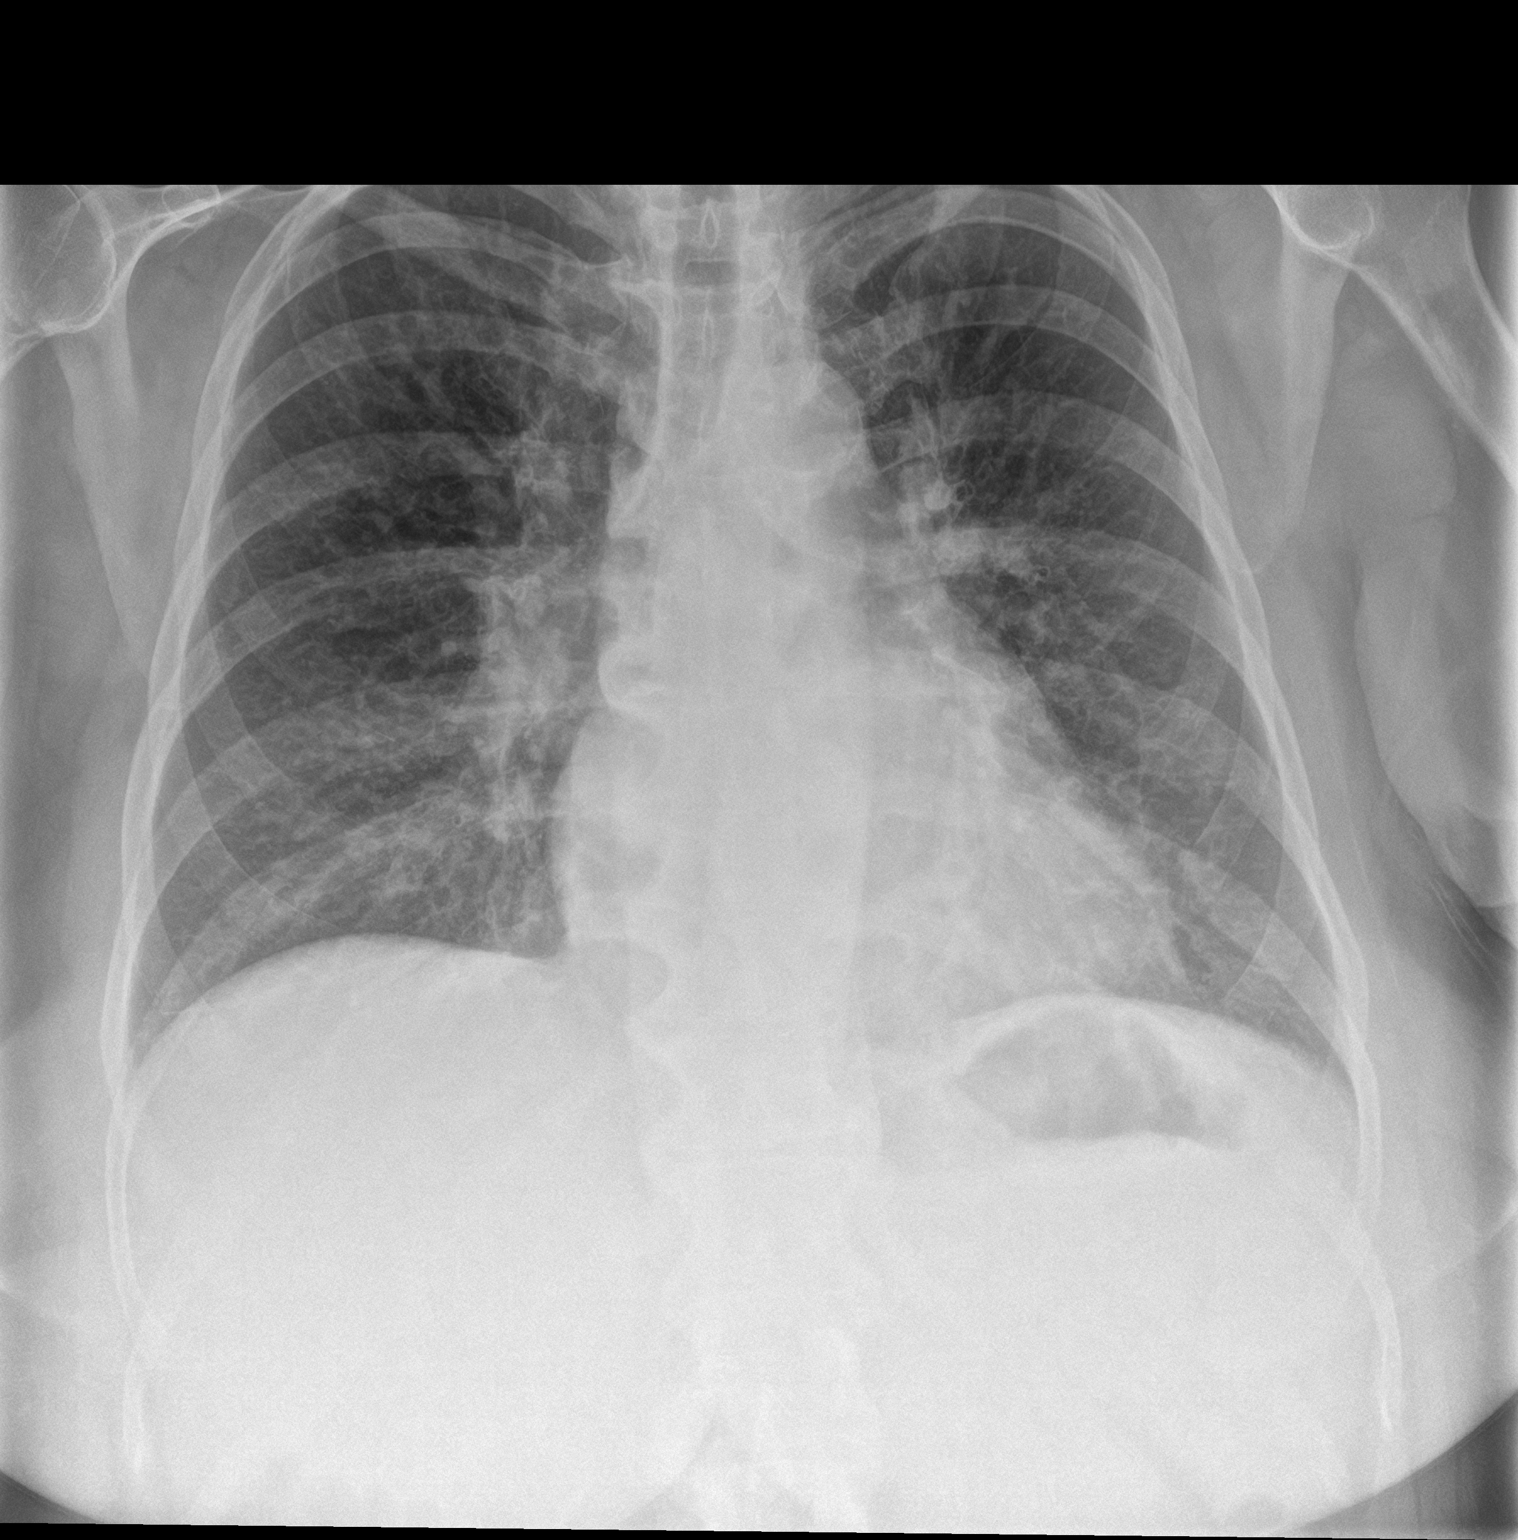

[chest lat]
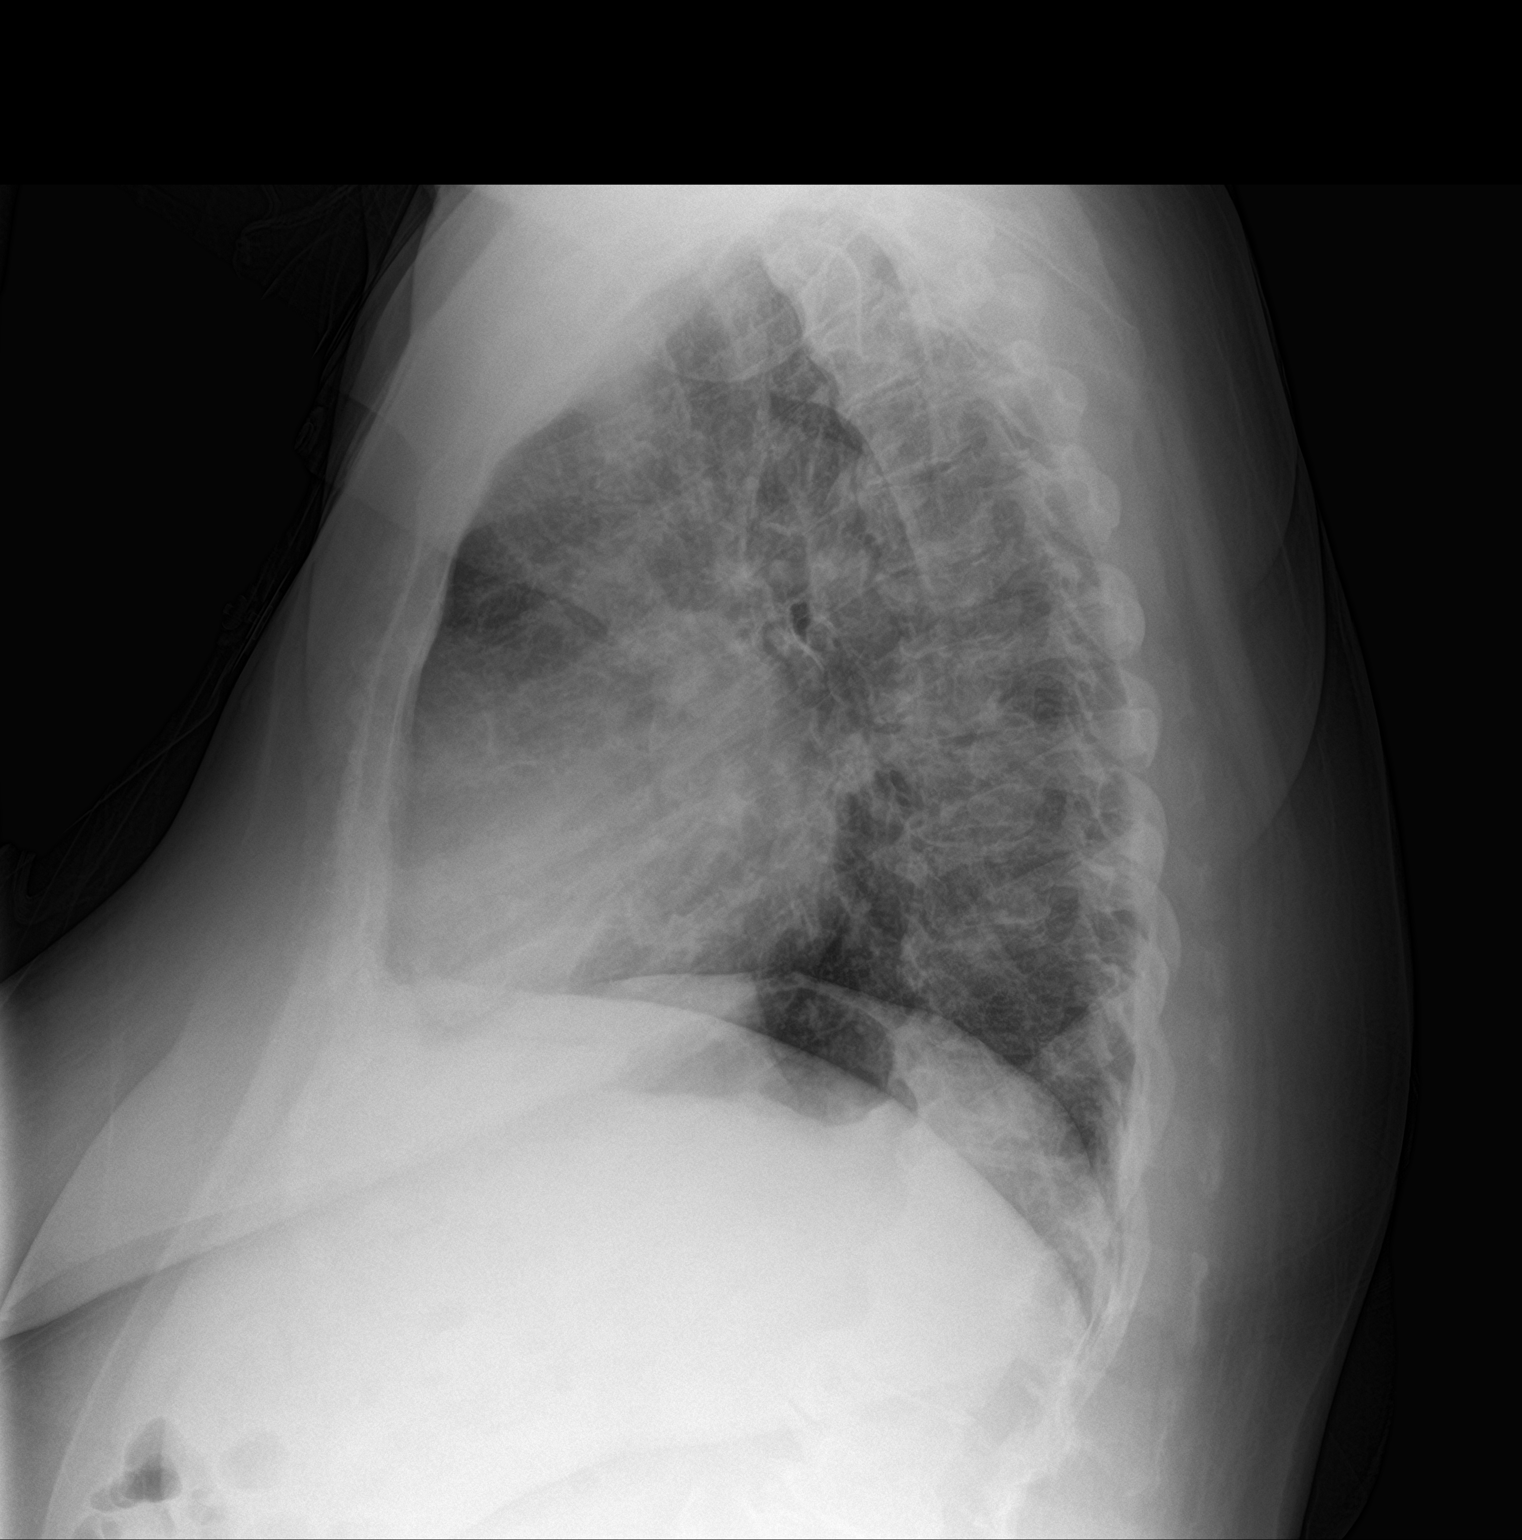

[2 of 2 positions shown; findings below may reference images not displayed]

FINDINGS: Normal cardiac silhouette. Mild central venous pulmonary congestion.
No effusion, infiltrate or pneumothorax. No acute osseous
abnormality.
IMPRESSION: Central venous pulmonary congestion.

## 2022-01-31 IMAGING — CT CT ANGIO CHEST
2 of 7 series · 18 of 46 positions shown · IV contrast (Omnipaque or Isovue)
Comparison: Radiograph earlier today.  CT [DATE]

CLINICAL DATA: Pulmonary embolism (PE) suspected, high prob

Respiratory distress.
EXAM:
CT ANGIOGRAPHY CHEST WITH CONTRAST
TECHNIQUE: Multidetector CT imaging of the chest was performed using the
standard protocol during bolus administration of intravenous
contrast. Multiplanar CT image reconstructions and MIPs were
obtained to evaluate the vascular anatomy.

[Series 5: pe axial thins · axial · 0.62mm/px · z∈[+1078,+1289]mm · 15 of 298 slices shown]
[im 17/298  lung]
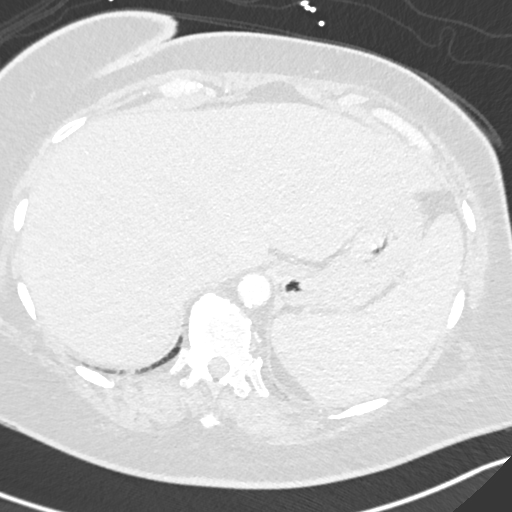
[im 34/298  soft-tissue]
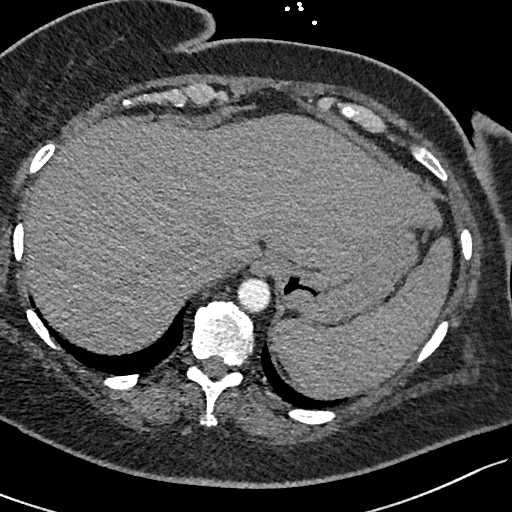
[im 50/298  lung]
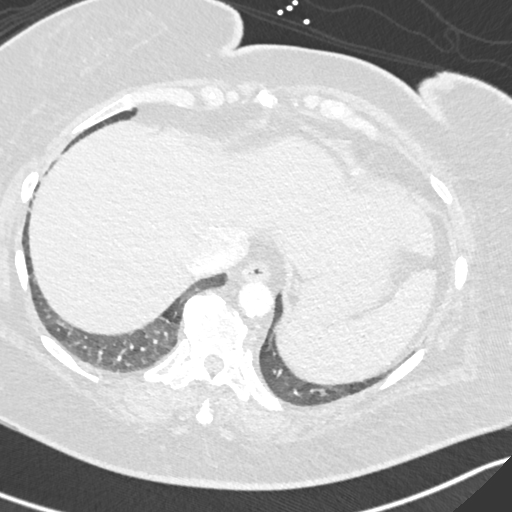
[im 67/298  soft-tissue]
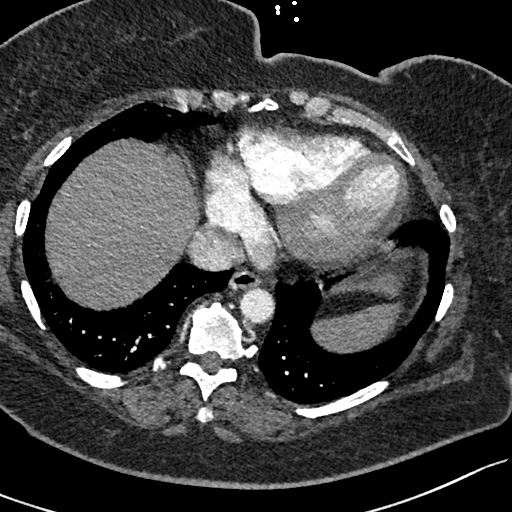
[im 100/298  lung]
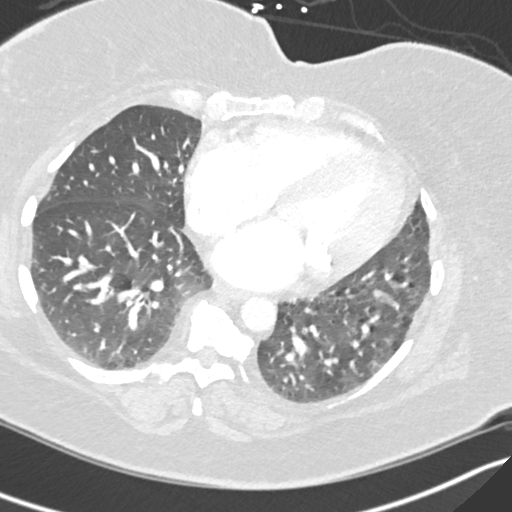
[im 116/298  soft-tissue]
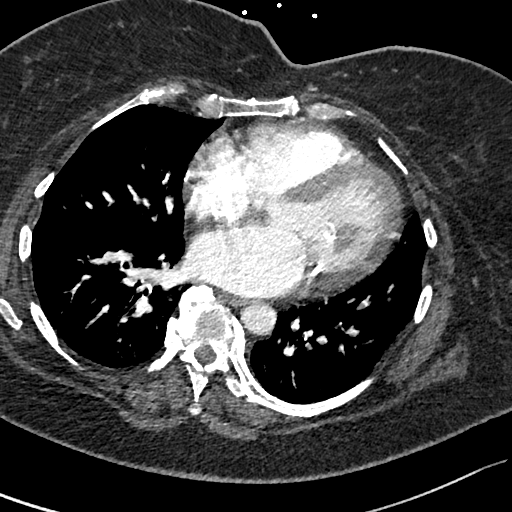
[im 133/298  lung]
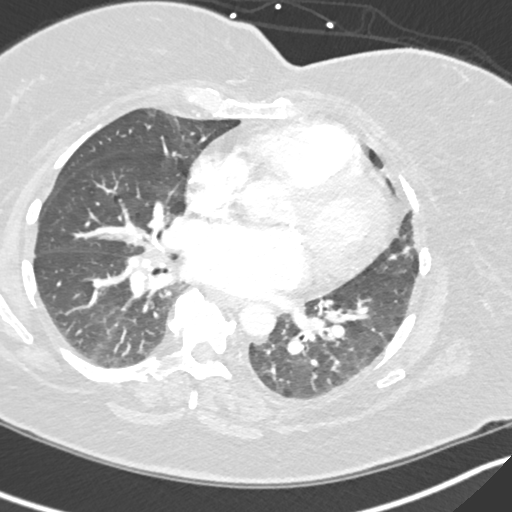
[im 149/298  soft-tissue]
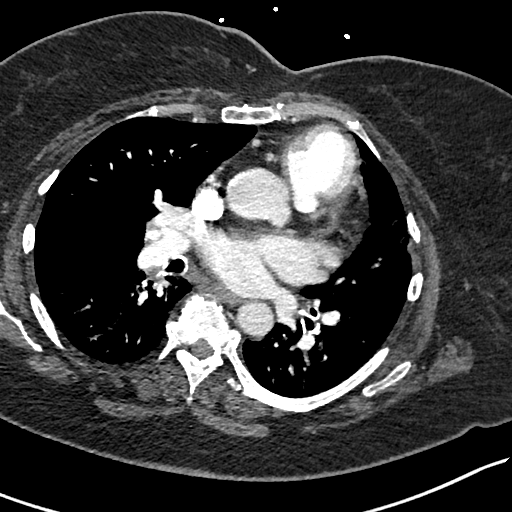
[im 166/298  lung]
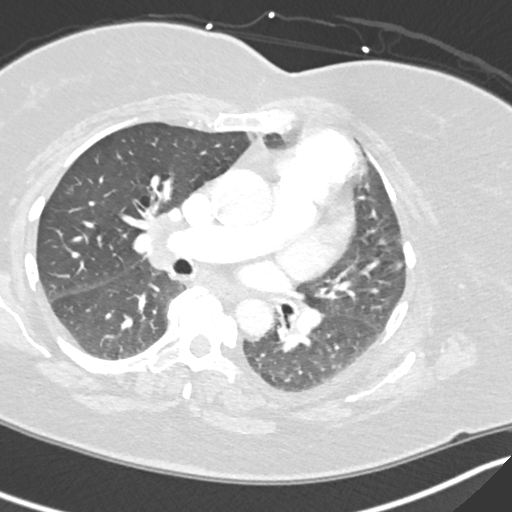
[im 182/298  soft-tissue]
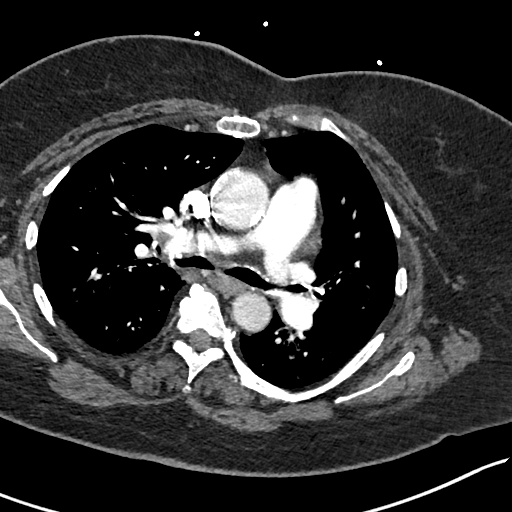
[im 199/298  lung]
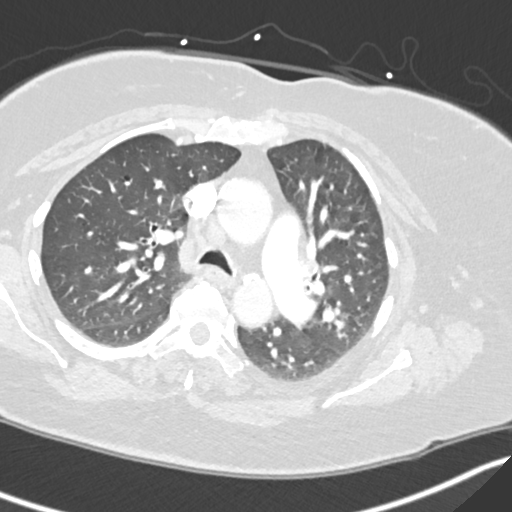
[im 232/298  soft-tissue]
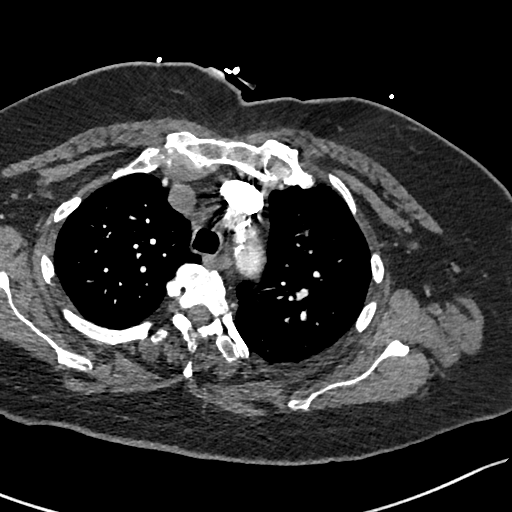
[im 248/298  lung]
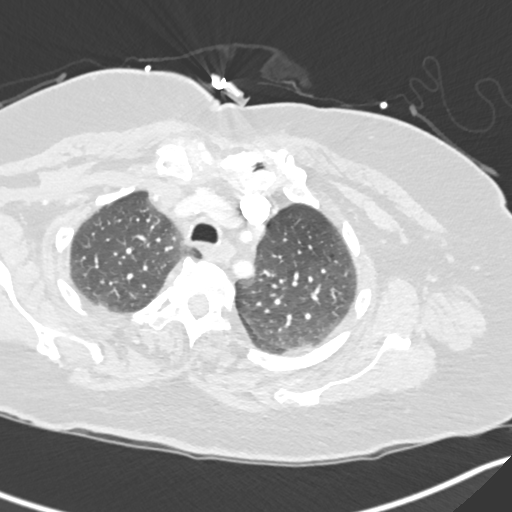
[im 265/298  soft-tissue]
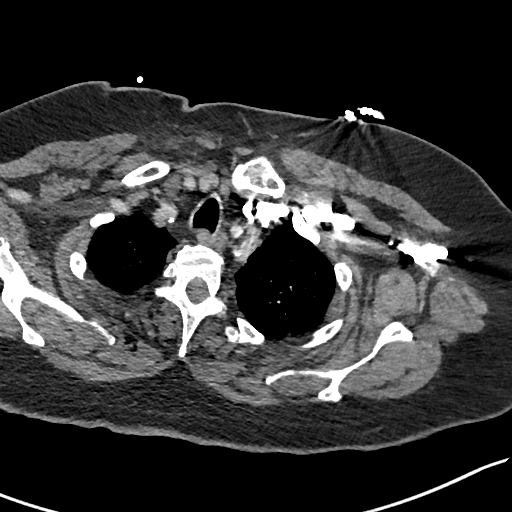
[im 281/298  lung]
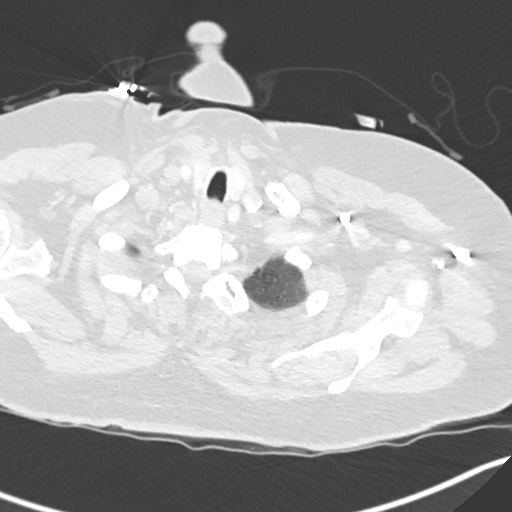

[Series 8: cor soft · coronal · 0.48mm/px · 3 of 123 slices shown]
[im 31/123  soft-tissue]
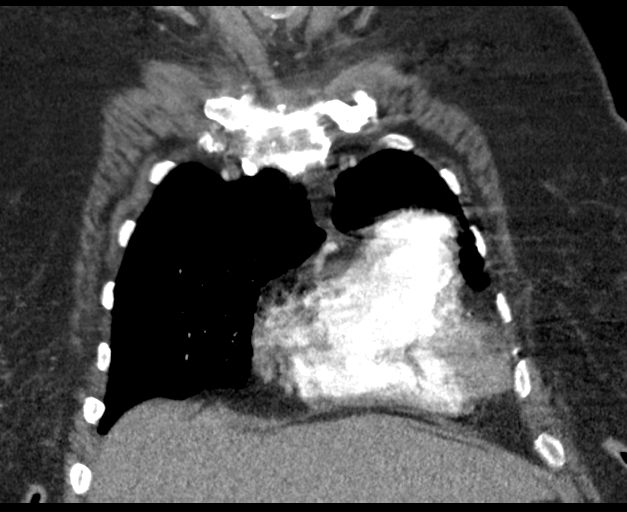
[im 62/123  soft-tissue]
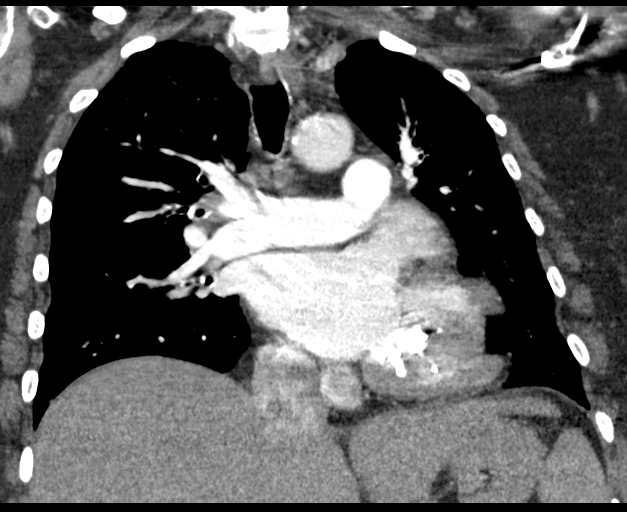
[im 92/123  soft-tissue]
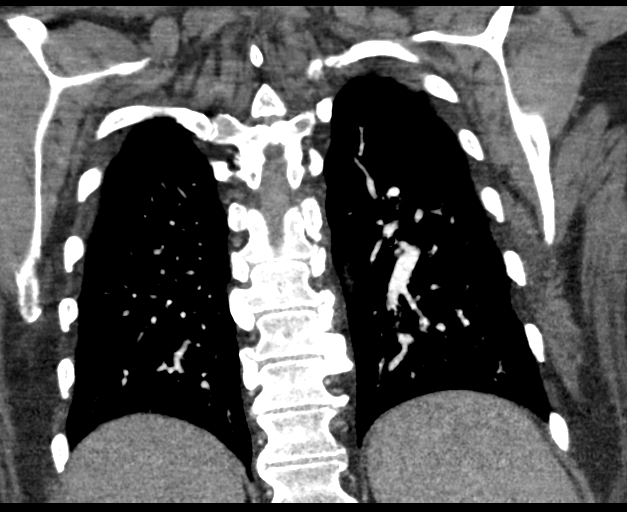

[18 of 46 positions shown; findings below may reference images not displayed]

RADIATION DOSE REDUCTION: This exam was performed according to the
departmental dose-optimization program which includes automated
exposure control, adjustment of the mA and/or kV according to
patient size and/or use of iterative reconstruction technique.

CONTRAST:  100mL OMNIPAQUE IOHEXOL 350 MG/ML SOLN
FINDINGS: Cardiovascular: There are no filling defects within the pulmonary
arteries to suggest pulmonary embolus. Moderate aortic
atherosclerosis. No aortic aneurysm or acute aortic findings. Mild
cardiomegaly. There are dense mitral annulus calcifications. No
pericardial effusion. Minimal contrast refluxes into the hepatic
veins.

Mediastinum/Nodes: Scattered mediastinal and hilar lymph nodes, not
enlarged by size criteria. No axillary adenopathy. No thyroid
nodule. No esophageal wall thickening.

Lungs/Pleura: Minor subsegmental atelectasis in the right lower
lobe. Bronchial thickening without confluent consolidation. No
pleural fluid. Sub solid ground-glass nodule in the right upper lobe
measuring 5 mm, series 6, image 55.

Upper Abdomen: No acute findings.

Musculoskeletal: Thoracic spondylosis with multilevel spurring.
Remote median sternotomy. There are no acute or suspicious osseous
abnormalities.

Review of the MIP images confirms the above findings.
IMPRESSION: 1. No pulmonary embolus.
2. Mild cardiomegaly. Minimal contrast refluxes into the hepatic
veins, suggesting elevated right heart pressures.
3. Bronchial thickening without confluent consolidation.
4. Sub solid ground-glass nodule in the right upper lobe measuring 5
mm. No routine follow-up imaging is recommended per [place]
Society Guidelines.
These guidelines do not apply to immunocompromised patients and
patients with cancer. Follow up in patients with significant
comorbidities as clinically warranted. For lung cancer screening,
adhere to Lung-RADS guidelines. Reference: Radiology. [[place]];

Aortic Atherosclerosis ([WI]-[WI]).

## 2022-01-31 MED ORDER — METHYLPREDNISOLONE SODIUM SUCC 125 MG IJ SOLR
125.0000 mg | Freq: Once | INTRAMUSCULAR | Status: AC
  Administered 2022-01-31: 125 mg via INTRAVENOUS
  Filled 2022-01-31: qty 2

## 2022-01-31 MED ORDER — ALBUTEROL SULFATE (2.5 MG/3ML) 0.083% IN NEBU
5.0000 mg | INHALATION_SOLUTION | Freq: Once | RESPIRATORY_TRACT | Status: AC
  Administered 2022-01-31: 5 mg via RESPIRATORY_TRACT
  Filled 2022-01-31: qty 6

## 2022-01-31 MED ORDER — KETOROLAC TROMETHAMINE 15 MG/ML IJ SOLN
15.0000 mg | Freq: Once | INTRAMUSCULAR | Status: AC
Start: 2022-01-31 — End: 2022-01-31
  Administered 2022-01-31: 15 mg via INTRAVENOUS
  Filled 2022-01-31: qty 1

## 2022-01-31 MED ORDER — PREDNISONE 10 MG PO TABS: 40.0000 mg | ORAL_TABLET | Freq: Every day | ORAL | 0 refills | Status: AC

## 2022-01-31 MED ORDER — IPRATROPIUM BROMIDE 0.02 % IN SOLN
RESPIRATORY_TRACT | Status: AC
  Filled 2022-01-31: qty 2.5

## 2022-01-31 MED ORDER — IOHEXOL 350 MG/ML SOLN
100.0000 mL | Freq: Once | INTRAVENOUS | Status: AC | PRN
Start: 2022-01-31 — End: 2022-01-31
  Administered 2022-01-31: 100 mL via INTRAVENOUS

## 2022-01-31 MED ORDER — OXYCODONE-ACETAMINOPHEN 5-325 MG PO TABS
1.0000 | ORAL_TABLET | Freq: Once | ORAL | Status: AC
  Administered 2022-01-31: 1 via ORAL
  Filled 2022-01-31: qty 1

## 2022-01-31 NOTE — Discharge Instructions (Addendum)
A prescription for steroid medication was sent to your pharmacy.  Take this as prescribed.  Continue home nebulizers as needed.  Follow-up with your primary care doctor and cardiologist for ongoing care.  Return to emergency department at any time if you experience any new or worsening symptoms. ?

## 2022-01-31 NOTE — Progress Notes (Signed)
? ?RE: Laura [name] MRN: [id number] DOB: Oct 19, 1960 ?Date of Telemedicine Visit: 01/31/2022 ? ?Referring provider: [name], MD ?Primary care provider: [name], MD ? ?Chief Complaint: Asthma (Coughing, fever highest 150f orally, coughing up green mucus out of chest, congestion bad, cant breath out nose, at home covid test is negative. Hard to breath doing inhalers and nebulizer and can still not breath throat is raw, has some diarrhea, and a light headache she has a pinch nerve in her neck and its aggervated right now too ) ? ? ?Telemedicine Follow Up Visit via Telephone: ?I connected with Laura [name] for a follow up on 01/31/22 by telephone and verified that I am speaking with the correct person using two identifiers. ?  ?I discussed the limitations, risks, security and privacy concerns of performing an evaluation and management service by telephone and the availability of in person appointments. I also discussed with the patient that there may be a patient responsible charge related to this service. The patient expressed understanding and agreed to proceed. ? ?Patient is at home  ?Provider is at the office.   ?Visit start time: 215 ?Visit end time: 245 ?Insurance consent/check in by: Sherri ?Medical consent and medical assistant/nurse: Morey Hummingbird ? ?History of Present Illness: ?She is a 61 y.o. 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 Her previous allergy office visit was on 12/31/2021 with Laura Morgan, FNP for evaluation of asthma with acute exacerbation requiring prednisone for resolution.  At today's visit, she reports that she has been " sick all weekend" with symptoms beginning on Thursday evening including congestion, cough producing green mucus, fever, diarrhea, and chills.  She reports that the symptoms worsened over Saturday and Sunday as well as today.  She reports taking a home COVID test that was negative earlier today.  She denies sick  contacts.  Asthma is reported as poorly controlled with current symptoms including shortness of breath with activity and rest, wheeze, chest tightness, and cough producing green phlegm.  She does report that she is currently in the tripod position to assist with breathing.  She is able to complete full sentences.  She continues Breo daily, montelukast daily, Flovent 2 puffs twice a day, and Xopenex every 3 hours over the weekend.  Chronic rhinitis is reported as poorly controlled with symptoms including green nasal drainage, nasal congestion, sneezing, and thick postnasal drainage.  She continues Flonase and nasal saline rinses.  She is not currently taking an antihistamine.  She reports that she self administered her Cuvitru infusion on Thursday with no issues.  Reflux is reported as well controlled with no heartburn.  She continues omeprazole 20 mg once a day.  She did require Augmentin for acute sinusitis in February 2023.  Her last immunoglobulin levels from 10/22/2021 include IgG 1227, IgA 42, and IgM 73. ?Patient advised to go to emergency department for urgent evaluation. ? ? ?Assessment and Plan: ?[name] is a 61 y.o. female with: ?Patient Instructions  ?Patient was advised to go to emergency department for urgent evaluation  ?Return in about 1 week (around 02/07/2022), or if symptoms worsen or fail to improve. ? ? ? ?Medication List:  ?Current Facility-Administered Medications  ?Medication Dose Route Frequency Provider Last Rate Last Admin  ? Benralizumab SOSY 30 mg  30 mg Subcutaneous Q28 days Dara Hoyer, FNP   30 mg at 12/27/21 1603  ? ?Current Outpatient Medications  ?Medication Sig Dispense Refill  ? acetaminophen (TYLENOL) 325  MG tablet Take 2 tablets (650 mg total) by mouth every 6 (six) hours as needed for mild pain, fever or headache. 30 tablet 1  ? albuterol (PROVENTIL) (2.5 MG/3ML) 0.083% nebulizer solution Take 3 mLs (2.5 mg total) by nebulization every 4 (four) hours as needed for wheezing or  shortness of breath. 75 mL 1  ? albuterol (VENTOLIN HFA) 108 (90 Base) MCG/ACT inhaler INHALE 2 PUFFS INTO THE LUNGS EVERY 6 HOURS AS NEEDED FOR WHEEZING OR SHORTNESS OF BREATH 18 g 1  ? apixaban (ELIQUIS) 5 MG TABS tablet Take 1 tablet (5 mg total) by mouth 2 (two) times daily. 180 tablet 3  ? atorvastatin (LIPITOR) 10 MG tablet Take 10 mg by mouth every evening.     ? BD PEN NEEDLE NANO 2ND GEN 32G X 4 MM MISC 1 each by Other route in the morning, at noon, in the evening, and at bedtime.    ? Benralizumab (FASENRA PEN) 30 MG/ML SOAJ INJECT $RemoveBef'30MG'[id number]$  SUBCUTANEOUSLY  EVERY 8 WEEKS 1 mL 6  ? bisoprolol (ZEBETA) 5 MG tablet Take 0.5 tablets (2.5 mg total) by mouth daily. 45 tablet 3  ? blood glucose meter kit and supplies 1 each by Other route 4 (four) times daily. Dispense based on patient and insurance preference. Use up to four times daily as directed. (FOR ICD-10 E10.9, E11.9). 1 each 0  ? Cholecalciferol (VITAMIN D3) 125 MCG (5000 UT) CAPS Take 1 capsule (5,000 Units total) by mouth daily. 90 capsule 0  ? Continuous Blood Gluc Sensor (FREESTYLE LIBRE 3 SENSOR) MISC 1 Piece by Does not apply route every 14 (fourteen) days. Place 1 sensor on the skin every 14 days. Use to check glucose continuously 6 each 2  ? dapagliflozin propanediol (FARXIGA) 5 MG TABS tablet Take 1 tablet (5 mg total) by mouth daily before breakfast. 30 tablet 2  ? diltiazem (CARDIZEM CD) 360 MG 24 hr capsule TAKE 1 CAPSULE(360 MG) BY MOUTH DAILY 90 capsule 3  ? divalproex (DEPAKOTE ER) 500 MG 24 hr tablet Take 1 tablet (500 mg total) by mouth daily AND 2 tablets (1,000 mg total) at bedtime. 270 tablet 0  ? ELDERBERRY PO Take 4 g by mouth daily. Gummies 2g each    ? EPINEPHrine 0.3 mg/0.3 mL IJ SOAJ injection Inject 0.3 mg into the muscle once as needed for anaphylaxis.    ? ferrous sulfate 324 MG TBEC Take 324 mg by mouth daily with breakfast.    ? fluticasone (FLOVENT HFA) 110 MCG/ACT inhaler Inhale 2 puffs into the lungs 2 (two) times daily. 1  each 5  ? fluticasone furoate-vilanterol (BREO ELLIPTA) 100-25 MCG/ACT AEPB Inhale 1 puff into the lungs daily. 60 each 11  ? gabapentin (NEURONTIN) 600 MG tablet Take 600 mg by mouth 3 (three) times daily.    ? hydrALAZINE (APRESOLINE) 50 MG tablet Take 1 tablet (50 mg total) by mouth 3 (three) times daily. 270 tablet 3  ? hydrOXYzine (ATARAX/VISTARIL) 25 MG tablet Take 1 tablet (25 mg total) by mouth every 6 (six) hours as needed for anxiety or nausea. 30 tablet 0  ? imipramine (TOFRANIL) 25 MG tablet Take 25 mg by mouth at bedtime.    ? Immune Globulin, Human, (CUVITRU [place]) Inject 25 mg into the skin.    ? Immune Globulin, Human, 4 GM/20ML SOLN Inject 100 mLs into the skin every 14 (fourteen) days.    ? insulin glargine (LANTUS SOLOSTAR) 100 UNIT/ML Solostar Pen Inject 70 Units into the skin at  bedtime. 30 mL 1  ? Insulin Pen Needle (PEN NEEDLES 3/16") 31G X 5 MM MISC Use as directed with insulin pen 100 each 0  ? levalbuterol (XOPENEX HFA) 45 MCG/ACT inhaler Inhale 2 puffs into the lungs every 6 (six) hours as needed for wheezing. 1 each 5  ? levalbuterol (XOPENEX) 1.25 MG/3ML nebulizer solution Take 1.25 mg by nebulization every 6 (six) hours as needed for wheezing. 600 mL 5  ? levothyroxine (SYNTHROID, LEVOTHROID) 112 MCG tablet Take 112 mcg by mouth daily before breakfast.    ? lidocaine-prilocaine (EMLA) cream Apply topically.    ? linaclotide (LINZESS) 145 MCG CAPS capsule Take 1 capsule (145 mcg total) by mouth daily before breakfast. 90 capsule 3  ? Melatonin 3 MG TABS Take 3 mg by mouth at bedtime.    ? metFORMIN (GLUCOPHAGE) 500 MG tablet Take 500 mg by mouth 2 (two) times daily.    ? metoCLOPramide (REGLAN) 5 MG tablet Take 5 mg by mouth 3 (three) times daily.    ? montelukast (SINGULAIR) 10 MG tablet Take 1 tablet by mouth daily.    ? Multiple Vitamins-Minerals (MULTIVITAMIN WITH MINERALS) tablet Take 1 tablet by mouth daily. Woman    ? naproxen (NAPROSYN) 500 MG tablet Take 1 tablet (500 mg total) by  mouth 2 (two) times daily with a meal. 30 tablet 0  ? Nebulizer MISC Nebulizer tubing kit 2 each 5  ? omeprazole (PRILOSEC) 20 MG capsule Take 1 capsule (20 mg total) by mouth 2 (two) times daily before a meal.

## 2022-01-31 NOTE — Telephone Encounter (Signed)
Called and spoke with patient, patient verbalized understanding and has been placed on the schedule today with Webb Silversmith for a televisit.  ?

## 2022-01-31 NOTE — Telephone Encounter (Signed)
Patient called and stated that she has been sick since last Wednesday. She has been having an asthma clare and worsening cough combined with fever of 100 and cold chills. She has nasal congestion and is producing dark green mucous. She has been visiting a family member in the hospital as well. I asked her to test for COVID and she is going to call me back after lunch with the results. She states that she has been taking all of her medications as directed.  ?

## 2022-01-31 NOTE — ED Provider Notes (Signed)
?[facility] ?Provider Note ? ? ?CSN: [id number] ?Arrival date & time: 01/31/22  1505 ? ?  ? ?History ? ?Chief Complaint  ?Patient presents with  ? Respiratory Distress  ? ? ?Laura [name] is a 61 y.o. female. ? ?HPI ?Patient presenting for shortness of breath.  Medical history includes asthma, reflux, CVID, atrial fibrillation.  Over the weekend she has had shortness of breath, cough productive of green mucus, fever, diarrhea, and chills.  Earlier today, she had a telehealth visit.  During that telehealth visit, there was concern of severe shortness of breath and patient was advised to come to the ED.  Currently, patient endorses continued shortness of breath.  She has acute on chronic pain in the area of her right shoulder, where she has been told that she has a pinched nerve.  This pain is worsened with range of motion.  She denies any other current areas of discomfort.  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  She did undergo an infusion on Thursday. ?  ? ?Home Medications ?Prior to Admission medications   ?Medication Sig Start Date End Date Taking? Authorizing Provider  ?predniSONE (DELTASONE) 10 MG tablet Take 4 tablets (40 mg total) by mouth daily for 4 days. Starting tomorrow 01/31/22 02/04/22 Yes Gloris Manchester, MD  ?acetaminophen (TYLENOL) 325 MG tablet Take 2 tablets (650 mg total) by mouth every 6 (six) hours as needed for mild pain, fever or headache. 08/11/18   Shon Hale, MD  ?albuterol (PROVENTIL) (2.5 MG/3ML) 0.083% nebulizer solution Take 3 mLs (2.5 mg total) by nebulization every 4 (four) hours as needed for wheezing or shortness of breath. 04/07/21   Nehemiah Settle, FNP  ?albuterol (VENTOLIN HFA) 108 (90 Base) MCG/ACT inhaler INHALE 2 PUFFS INTO THE LUNGS EVERY 6 HOURS AS NEEDED FOR WHEEZING OR SHORTNESS OF BREATH 04/07/21   Nehemiah Settle, FNP  ?apixaban (ELIQUIS) 5 MG TABS tablet Take 1 tablet (5 mg total) by mouth 2 (two) times daily. 07/14/21   Antoine Poche, MD  ?atorvastatin (LIPITOR) 10 MG tablet Take 10 mg by mouth every evening.     [provider]  ?BD PEN NEEDLE NANO 2ND GEN 32G X 4 MM MISC 1 each by Other route in the morning, at noon, in the evening, and at bedtime. 05/20/20   [provider]  ?Sharlotte Alamo [facility] PEN) 30 MG/ML SOAJ INJECT 30MG  SUBCUTANEOUSLY  EVERY 8 WEEKS 12/06/21   12/08/21, MD  ?bisoprolol (ZEBETA) 5 MG tablet Take 0.5 tablets (2.5 mg total) by mouth daily. 07/14/21   09/13/21, MD  ?blood glucose meter kit and supplies 1 each by Other route 4 (four) times daily. Dispense based on patient and insurance preference. Use up to four times daily as directed. (FOR ICD-10 E10.9, E11.9). 01/27/21   01/29/21, MD  ?Cholecalciferol (VITAMIN D3) 125 MCG (5000 UT) CAPS Take 1 capsule (5,000 Units total) by mouth daily. 11/25/20   11/27/20, MD  ?Continuous Blood Gluc Sensor (FREESTYLE LIBRE 3 SENSOR) MISC 1 Piece by Does not apply route every 14 (fourteen) days. Place 1 sensor on the skin every 14 days. Use to check glucose continuously 10/28/21   10/30/21, MD  ?dapagliflozin propanediol (FARXIGA) 5 MG TABS tablet Take 1 tablet (5 mg total) by mouth daily before breakfast. 01/14/22   Nida, 03/16/22, MD  ?diltiazem (CARDIZEM CD) 360 MG 24 hr capsule TAKE 1 CAPSULE(360 MG) BY MOUTH DAILY 03/01/21  Arnoldo Lenis, MD  ?divalproex (DEPAKOTE ER) 500 MG 24 hr tablet Take 1 tablet (500 mg total) by mouth daily AND 2 tablets (1,000 mg total) at bedtime. 01/26/22 04/26/22  [name], MD  ?ELDERBERRY PO Take 4 g by mouth daily. Gummies 2g each    [provider]  ?EPINEPHrine 0.3 mg/0.3 mL IJ SOAJ injection Inject 0.3 mg into the muscle once as needed for anaphylaxis. 01/17/20   [provider]  ?ferrous sulfate 324 MG TBEC Take 324 mg by mouth daily with breakfast.    [provider]  ?fluticasone (FLOVENT HFA) 110 MCG/ACT inhaler Inhale 2  puffs into the lungs 2 (two) times daily. 12/31/21   Dara Hoyer, FNP  ?fluticasone furoate-vilanterol (BREO ELLIPTA) 100-25 MCG/ACT AEPB Inhale 1 puff into the lungs daily. 10/27/21   Rigoberto Noel, MD  ?gabapentin (NEURONTIN) 600 MG tablet Take 600 mg by mouth 3 (three) times daily. 07/20/21   [provider]  ?hydrALAZINE (APRESOLINE) 50 MG tablet Take 1 tablet (50 mg total) by mouth 3 (three) times daily. 01/10/22 04/10/22  Arnoldo Lenis, MD  ?hydrOXYzine (ATARAX/VISTARIL) 25 MG tablet Take 1 tablet (25 mg total) by mouth every 6 (six) hours as needed for anxiety or nausea. 08/11/18   Roxan Hockey, MD  ?imipramine (TOFRANIL) 25 MG tablet Take 25 mg by mouth at bedtime. 12/07/21   [provider]  ?Immune Globulin, Human, (CUVITRU [place]) Inject 25 mg into the skin. 12/24/21   [provider]  ?Immune Globulin, Human, 4 GM/20ML SOLN Inject 100 mLs into the skin every 14 (fourteen) days.    [provider]  ?insulin glargine (LANTUS SOLOSTAR) 100 UNIT/ML Solostar Pen Inject 70 Units into the skin at bedtime. 01/28/22   Cassandria Anger, MD  ?Insulin Pen Needle (PEN NEEDLES 3/16") 31G X 5 MM MISC Use as directed with insulin pen 10/21/21   Cassandria Anger, MD  ?levalbuterol [facility] HFA) 45 MCG/ACT inhaler Inhale 2 puffs into the lungs every 6 (six) hours as needed for wheezing. 12/31/21   Ambs, Kathrine Cords, FNP  ?levalbuterol (XOPENEX) 1.25 MG/3ML nebulizer solution Take 1.25 mg by nebulization every 6 (six) hours as needed for wheezing. 12/31/21   Ambs, Kathrine Cords, FNP  ?levothyroxine (SYNTHROID, LEVOTHROID) 112 MCG tablet Take 112 mcg by mouth daily before breakfast.    [provider]  ?lidocaine-prilocaine (EMLA) cream Apply topically. 12/03/21   [provider]  ?linaclotide Rolan Lipa) 145 MCG CAPS capsule Take 1 capsule (145 mcg total) by mouth daily before breakfast. 06/07/21   Mahala Menghini, PA-C  ?Melatonin 3 MG TABS Take 3 mg by mouth at bedtime.     [provider]  ?metFORMIN (GLUCOPHAGE) 500 MG tablet Take 500 mg by mouth 2 (two) times daily. 07/21/21   [provider]  ?metoCLOPramide (REGLAN) 5 MG tablet Take 5 mg by mouth 3 (three) times daily. 04/05/21   [provider]  ?montelukast (SINGULAIR) 10 MG tablet Take 1 tablet by mouth daily. 11/23/20   [provider]  ?Multiple Vitamins-Minerals (MULTIVITAMIN WITH MINERALS) tablet Take 1 tablet by mouth daily. Woman    [provider]  ?naproxen (NAPROSYN) 500 MG tablet Take 1 tablet (500 mg total) by mouth 2 (two) times daily with a meal. 12/09/21   Noemi Chapel, MD  ?Nebulizer MISC Nebulizer tubing kit 10/21/20   Valentina Shaggy, MD  ?omeprazole (PRILOSEC) 20 MG capsule Take 1 capsule (20 mg total) by mouth 2 (two) times  daily before a meal. 12/31/21   Ambs, Kathrine Cords, FNP  ?ondansetron (ZOFRAN) 4 MG tablet Take 1 tablet (4 mg total) by mouth every 8 (eight) hours as needed for nausea or vomiting. 07/13/19   Chevis Pretty, FNP  ?ONETOUCH ULTRA test strip 1 each by Other route in the morning, at noon, in the evening, and at bedtime. 10/17/19   [provider]  ?polyethylene glycol (MIRALAX / GLYCOLAX) 17 g packet Take 17 g by mouth daily as needed. 04/17/21   [place] Eric J, DO  ?Potassium Chloride ER 20 MEQ TBCR Take 20 mEq by mouth daily. 01/04/21   [provider]  ?topiramate (TOPAMAX) 100 MG tablet Take 1 tablet (100 mg total) by mouth 2 (two) times daily. 08/05/21   Tat, Eustace Quail, DO  ?torsemide (DEMADEX) 20 MG tablet Take 1 tablet daily for fluid gain of 3 lb overnight or 5 lbs in a week. ?Patient not taking: Reported on 01/31/2022 01/25/22   Arnoldo Lenis, MD  ?triamcinolone cream (KENALOG) 0.1 % Apply 1 application topically 2 (two) times daily. 03/15/21   [provider]  ?TRULICITY 1.5 WR/0.4BT SOPN ADMINISTER 1.5 MG UNDER THE SKIN ONCE A WEEK ON SUNDAY 01/19/22   Cassandria Anger, MD  ?vitamin B-12 (CYANOCOBALAMIN)  1000 MCG tablet Take 1,000 mcg by mouth daily.    [provider]  ?vitamin C (ASCORBIC ACID) 500 MG tablet Take 500 mg by mouth daily. Power C immune support    [provider]  ?   ? ?Allergies    ?

## 2022-01-31 NOTE — ED Notes (Signed)
Patient transported to CT 

## 2022-01-31 NOTE — Patient Instructions (Signed)
Patient was advised to go to emergency department for urgent evaluation ?

## 2022-01-31 NOTE — ED Triage Notes (Signed)
Pt c/o respiratory distress and is unable to answer questions during triage ?

## 2022-01-31 NOTE — Telephone Encounter (Signed)
There is plenty of space on my schedule after lunch if she wants an appointment. Can do televisit if necessary. Thank you

## 2022-02-02 ENCOUNTER — Other Ambulatory Visit: Payer: Self-pay | Admitting: Neurology

## 2022-02-02 ENCOUNTER — Telehealth: Payer: Self-pay | Admitting: "Endocrinology

## 2022-02-02 ENCOUNTER — Ambulatory Visit (INDEPENDENT_AMBULATORY_CARE_PROVIDER_SITE_OTHER): Payer: Medicare Other | Admitting: Clinical

## 2022-02-02 ENCOUNTER — Telehealth: Payer: Self-pay | Admitting: Cardiology

## 2022-02-02 DIAGNOSIS — F4310 Post-traumatic stress disorder, unspecified: Secondary | ICD-10-CM

## 2022-02-02 DIAGNOSIS — G4733 Obstructive sleep apnea (adult) (pediatric): Secondary | ICD-10-CM | POA: Diagnosis not present

## 2022-02-02 DIAGNOSIS — J4551 Severe persistent asthma with (acute) exacerbation: Secondary | ICD-10-CM | POA: Diagnosis not present

## 2022-02-02 DIAGNOSIS — E1165 Type 2 diabetes mellitus with hyperglycemia: Secondary | ICD-10-CM | POA: Diagnosis not present

## 2022-02-02 DIAGNOSIS — I4819 Other persistent atrial fibrillation: Secondary | ICD-10-CM | POA: Diagnosis not present

## 2022-02-02 DIAGNOSIS — J069 Acute upper respiratory infection, unspecified: Secondary | ICD-10-CM | POA: Diagnosis not present

## 2022-02-02 DIAGNOSIS — M25511 Pain in right shoulder: Secondary | ICD-10-CM | POA: Diagnosis not present

## 2022-02-02 NOTE — Telephone Encounter (Signed)
Patient said that she has bronchitis and they gave her a steroid shot and on prednisone. Her sugar right now is 329. She said it was so high last night that it woke her up. Can you pull her libre readings and give to Dr Dorris Fetch to advise on medication ?

## 2022-02-02 NOTE — Telephone Encounter (Signed)
Uploaded her data and laid it on Dr.Nida's desk. ?

## 2022-02-02 NOTE — Telephone Encounter (Signed)
Patient has been in the hospital and she has gain weight. ? ?Pt c/o Shortness Of Breath: STAT if SOB developed within the last 24 hours or pt is noticeably SOB on the phone ? ?1. Are you currently SOB (can you hear that pt is SOB on the phone)? Yes  ? ?2. How long have you been experiencing SOB? while ? ?3. Are you SOB when sitting or when up moving around? Both  ? ?4. Are you currently experiencing any other symptoms? Dizziness, lightheadedness and nausea  ? ? ?

## 2022-02-02 NOTE — Telephone Encounter (Signed)
Pt called to give Laura [name] an update - pt was told she has a bad case of bronchitis and a really bad virus. Pt is currently at home.  ?

## 2022-02-02 NOTE — Progress Notes (Signed)
Virtual Visit via Video Note ?  ?I connected with Laura [name] on 02/02/22 at  9:00 AM EST by a video enabled telemedicine application and verified that I am speaking with the correct person using two identifiers. ?  ?Location: ?Patient: Home ?Provider: Office ?  ?I discussed the limitations of evaluation and management by telemedicine and the availability of in person appointments. The patient expressed understanding and agreed to proceed. ?  ?THERAPIST PROGRESS NOTE ?  ?Session Time: 9:00 AM-:9:55 AM ?  ?Participation Level: Active ?  ?Behavioral Response: CasualAlertDepressed ?  ?Type of Therapy: Individual Therapy ?  ?Treatment Goals addressed: Coping ?  ?Interventions: CBT, DBT, Solution Focused, Strength-based and Supportive ?  ?Summary: Laura [name] is a 61 y.o. female who presents with PTSD. The OPT therapist worked with the patient for her ongoing OPT treatment session. The OPT therapist utilized Motivational Interviewing to assist in creating therapeutic repore.The patient in the session was engaged and work in collaboration giving feedback about her triggers and symptoms over the past few weeks.The patient spoke about recently being in the ER due to difficulty with breathing in part the patient notes due to her having some from of viral infection non- COVID. The OPT therapist utilized Cognitive Behavioral Therapy through cognitive restructuring as well as worked with the patient on coping strategies to assist in management of mental health symptoms. The patient reviewed her ongoing work on her physical health. The patient is scheduled with her PCP later today but the patient is concerned she may have pneumonia and is going to follow up today with her PCP. The patient additionally noted, " Get this I am back dating now seeing some people".The OPT therapist placed emphasis on the patient keeping all upcoming health appointments and adhering to ongoing health treatment recommendations.  ?   ?Suicidal/Homicidal: Nowithout intent/plan ?  ?Therapist Response:The OPT therapist worked with the patient for the patients scheduled session. The patient was engaged in her session and gave feedback in relation to triggers, symptoms, and behavior responses over the past few weeks. The OPT therapist worked with the patient utilizing an in session Cognitive Behavioral Therapy exercise. The patient was responsive in the session and verbalized, " My heart now is completely in Afib and so they cant do my foot surgery because the risk". The patient spoke about her ongoing foot pain and noted she can no longer wear a normal shoe.The patient is going to see Dr. Lovena Le heart specialist to talk more about her heart problem on May 10th. The patient will continue to work with her health care providers including PCP and Cardiologist.The OPT therapist will continue treatment work with the patient in her next scheduled session. ?  ?Plan: Return again in 2/3 weeks. ?  ?Diagnosis:      Axis I:Post Traumatic Stress Disorder ?  ?                        Axis II: No diagnosis ?  ?  ?Collaboration of Care: Overview of patient recent ER visit in relation to difficulty with breathing. The patient will be working with cardiac specialist Dr. Lovena Le on May 10th. ?  ?Patient/Guardian was advised Release of Information must be obtained prior to any record release in order to collaborate their care with an outside provider. Patient/Guardian was advised if they have not already done so to contact the registration department to sign all necessary forms in order for [place] to release information regarding their care.  ?  ?  Consent: Patient/Guardian gives verbal consent for treatment and assignment of benefits for services provided during this visit. Patient/Guardian expressed understanding and agreed to proceed.  ?  ?  ?I discussed the assessment and treatment plan with the patient. The patient was provided an opportunity to ask questions and all  were answered. The patient agreed with the plan and demonstrated an understanding of the instructions. ?  ?The patient was advised to call back or seek an in-person evaluation if the symptoms worsen or if the condition fails to improve as anticipated. ?  ?I provided 55 minutes of non-face-to-face time during this encounter. ?  ?[name], LCSW ?  ?02/02/2022 ?

## 2022-02-02 NOTE — Telephone Encounter (Signed)
Pt states that she was in the ER on Monday. She has appt with PCP on today. Pt c/o SOB since Sat. She had plasma infusions on Thursday. Pt weight on yesterday was 209 lbs and today's weight is 212 lbs. Current BP is 165/75 HR 63. Pt currently taking Prednisone 40 mg daily. Pt states " I just don't feel good".  ?

## 2022-02-02 NOTE — Telephone Encounter (Signed)
Thank you :)

## 2022-02-02 NOTE — Telephone Encounter (Signed)
Discussed with pt, she stated her Elenor Legato was registering HI. Dr.Nida made aware. Advised pt to increase her lantus to 80 units daily at bedtime and call us back tomorrow. Pt voiced understanding. ?

## 2022-02-03 ENCOUNTER — Telehealth: Payer: Self-pay | Admitting: Family Medicine

## 2022-02-03 NOTE — Telephone Encounter (Signed)
Follow Up: ? ? ? ?Patient is calling to find out what was decided. ?

## 2022-02-03 NOTE — Telephone Encounter (Signed)
Patient reports that she received prednisone upon discharge from the ED. She then went to Dr. Nevada Crane and received an extended dose of prednisone and started on Doxycycline. Today she reports that she has been breathing a little better. She continues her inhalers without any changes. She does report that she has had a 4 pound weight gain overnight and currently has a call into her cardiologist, Dr. Harl Bowie. She verbalizes understanding to call the clinic if she develops any worsening symptoms or fever.  She will follow-up in the [place] clinic on Monday a scheduled appointment.  She is aware that there is an on-call physician should she need any assistance. ?

## 2022-02-03 NOTE — Telephone Encounter (Signed)
Current weight is 211 lbs.  ?

## 2022-02-04 ENCOUNTER — Ambulatory Visit: Payer: Medicare Other | Admitting: Physician Assistant

## 2022-02-04 NOTE — Telephone Encounter (Signed)
Pt notified and verbalized agreement. Pt will take torsemide x3 days and update office on Monday regarding weight. Pt had no other questions or concerns at this time.  ?

## 2022-02-04 NOTE — Telephone Encounter (Signed)
ER note suggests infectious upper respiratoary tract infection which is being treated by pcp. Perhaps the prednisone has caused some additional fluid retention which could be contributing. Has she taken any prn torsemide, if not would take x 3 days and udpate [place] on Monday. Needs to continue to follow with pcp ? ?Zandra Abts MD ?

## 2022-02-07 ENCOUNTER — Other Ambulatory Visit: Payer: Self-pay

## 2022-02-07 ENCOUNTER — Ambulatory Visit (INDEPENDENT_AMBULATORY_CARE_PROVIDER_SITE_OTHER): Payer: Medicare Other | Admitting: Family Medicine

## 2022-02-07 ENCOUNTER — Inpatient Hospital Stay (HOSPITAL_COMMUNITY)
Admission: EM | Admit: 2022-02-07 | Discharge: 2022-02-14 | DRG: 312 | Disposition: A | Discharge status: Home Health | Payer: Medicare Other | Attending: Family Medicine | Admitting: Family Medicine

## 2022-02-07 ENCOUNTER — Emergency Department (HOSPITAL_COMMUNITY): Payer: Medicare Other

## 2022-02-07 ENCOUNTER — Telehealth: Payer: Self-pay | Admitting: Cardiology

## 2022-02-07 ENCOUNTER — Encounter (HOSPITAL_COMMUNITY): Payer: Self-pay

## 2022-02-07 VITALS — BP 140/70 | HR 74 | Temp 98.6°F | Resp 16 | Ht 63.0 in | Wt 211.4 lb

## 2022-02-07 DIAGNOSIS — K219 Gastro-esophageal reflux disease without esophagitis: Secondary | ICD-10-CM | POA: Diagnosis not present

## 2022-02-07 DIAGNOSIS — E1165 Type 2 diabetes mellitus with hyperglycemia: Secondary | ICD-10-CM | POA: Diagnosis not present

## 2022-02-07 DIAGNOSIS — T380X5A Adverse effect of glucocorticoids and synthetic analogues, initial encounter: Secondary | ICD-10-CM | POA: Diagnosis not present

## 2022-02-07 DIAGNOSIS — R051 Acute cough: Secondary | ICD-10-CM

## 2022-02-07 DIAGNOSIS — D839 Common variable immunodeficiency, unspecified: Secondary | ICD-10-CM | POA: Diagnosis not present

## 2022-02-07 DIAGNOSIS — G4733 Obstructive sleep apnea (adult) (pediatric): Secondary | ICD-10-CM | POA: Diagnosis not present

## 2022-02-07 DIAGNOSIS — R079 Chest pain, unspecified: Secondary | ICD-10-CM | POA: Diagnosis not present

## 2022-02-07 DIAGNOSIS — I4819 Other persistent atrial fibrillation: Secondary | ICD-10-CM | POA: Diagnosis not present

## 2022-02-07 DIAGNOSIS — E1142 Type 2 diabetes mellitus with diabetic polyneuropathy: Secondary | ICD-10-CM | POA: Diagnosis not present

## 2022-02-07 DIAGNOSIS — J310 Chronic rhinitis: Secondary | ICD-10-CM

## 2022-02-07 DIAGNOSIS — I5032 Chronic diastolic (congestive) heart failure: Secondary | ICD-10-CM | POA: Diagnosis not present

## 2022-02-07 DIAGNOSIS — Z87891 Personal history of nicotine dependence: Secondary | ICD-10-CM

## 2022-02-07 DIAGNOSIS — J440 Chronic obstructive pulmonary disease with acute lower respiratory infection: Secondary | ICD-10-CM | POA: Diagnosis present

## 2022-02-07 DIAGNOSIS — Z79899 Other long term (current) drug therapy: Secondary | ICD-10-CM | POA: Diagnosis not present

## 2022-02-07 DIAGNOSIS — Z818 Family history of other mental and behavioral disorders: Secondary | ICD-10-CM

## 2022-02-07 DIAGNOSIS — E039 Hypothyroidism, unspecified: Secondary | ICD-10-CM | POA: Diagnosis not present

## 2022-02-07 DIAGNOSIS — J4551 Severe persistent asthma with (acute) exacerbation: Secondary | ICD-10-CM | POA: Diagnosis present

## 2022-02-07 DIAGNOSIS — Z8379 Family history of other diseases of the digestive system: Secondary | ICD-10-CM

## 2022-02-07 DIAGNOSIS — Z811 Family history of alcohol abuse and dependence: Secondary | ICD-10-CM

## 2022-02-07 DIAGNOSIS — R55 Syncope and collapse: Secondary | ICD-10-CM | POA: Diagnosis not present

## 2022-02-07 DIAGNOSIS — Z841 Family history of disorders of kidney and ureter: Secondary | ICD-10-CM

## 2022-02-07 DIAGNOSIS — Z8249 Family history of ischemic heart disease and other diseases of the circulatory system: Secondary | ICD-10-CM

## 2022-02-07 DIAGNOSIS — Z9989 Dependence on other enabling machines and devices: Secondary | ICD-10-CM | POA: Diagnosis not present

## 2022-02-07 DIAGNOSIS — Z8774 Personal history of (corrected) congenital malformations of heart and circulatory system: Secondary | ICD-10-CM | POA: Diagnosis not present

## 2022-02-07 DIAGNOSIS — Z8042 Family history of malignant neoplasm of prostate: Secondary | ICD-10-CM

## 2022-02-07 DIAGNOSIS — E785 Hyperlipidemia, unspecified: Secondary | ICD-10-CM | POA: Diagnosis present

## 2022-02-07 DIAGNOSIS — Z823 Family history of stroke: Secondary | ICD-10-CM

## 2022-02-07 DIAGNOSIS — E669 Obesity, unspecified: Secondary | ICD-10-CM | POA: Diagnosis present

## 2022-02-07 DIAGNOSIS — D801 Nonfamilial hypogammaglobulinemia: Secondary | ICD-10-CM | POA: Diagnosis present

## 2022-02-07 DIAGNOSIS — R0789 Other chest pain: Secondary | ICD-10-CM | POA: Diagnosis not present

## 2022-02-07 DIAGNOSIS — Z888 Allergy status to other drugs, medicaments and biological substances status: Secondary | ICD-10-CM | POA: Diagnosis not present

## 2022-02-07 DIAGNOSIS — Z7985 Long-term (current) use of injectable non-insulin antidiabetic drugs: Secondary | ICD-10-CM

## 2022-02-07 DIAGNOSIS — Z79890 Hormone replacement therapy (postmenopausal): Secondary | ICD-10-CM

## 2022-02-07 DIAGNOSIS — Z794 Long term (current) use of insulin: Secondary | ICD-10-CM

## 2022-02-07 DIAGNOSIS — J4550 Severe persistent asthma, uncomplicated: Secondary | ICD-10-CM

## 2022-02-07 DIAGNOSIS — Z7901 Long term (current) use of anticoagulants: Secondary | ICD-10-CM

## 2022-02-07 DIAGNOSIS — F4310 Post-traumatic stress disorder, unspecified: Secondary | ICD-10-CM | POA: Diagnosis present

## 2022-02-07 DIAGNOSIS — I4892 Unspecified atrial flutter: Secondary | ICD-10-CM | POA: Diagnosis not present

## 2022-02-07 DIAGNOSIS — I110 Hypertensive heart disease with heart failure: Secondary | ICD-10-CM | POA: Diagnosis present

## 2022-02-07 DIAGNOSIS — Z6836 Body mass index (BMI) 36.0-36.9, adult: Secondary | ICD-10-CM

## 2022-02-07 DIAGNOSIS — Z7951 Long term (current) use of inhaled steroids: Secondary | ICD-10-CM

## 2022-02-07 DIAGNOSIS — I4821 Permanent atrial fibrillation: Secondary | ICD-10-CM | POA: Diagnosis not present

## 2022-02-07 DIAGNOSIS — F319 Bipolar disorder, unspecified: Secondary | ICD-10-CM | POA: Diagnosis present

## 2022-02-07 DIAGNOSIS — Z8349 Family history of other endocrine, nutritional and metabolic diseases: Secondary | ICD-10-CM

## 2022-02-07 DIAGNOSIS — I4891 Unspecified atrial fibrillation: Secondary | ICD-10-CM | POA: Diagnosis not present

## 2022-02-07 DIAGNOSIS — I951 Orthostatic hypotension: Secondary | ICD-10-CM | POA: Diagnosis not present

## 2022-02-07 DIAGNOSIS — Z7984 Long term (current) use of oral hypoglycemic drugs: Secondary | ICD-10-CM

## 2022-02-07 DIAGNOSIS — Z8673 Personal history of transient ischemic attack (TIA), and cerebral infarction without residual deficits: Secondary | ICD-10-CM

## 2022-02-07 LAB — CBC
HCT: 41.2 % (ref 36.0–46.0)
Hemoglobin: 14.1 g/dL (ref 12.0–15.0)
MCH: 31.9 pg (ref 26.0–34.0)
MCHC: 34.2 g/dL (ref 30.0–36.0)
MCV: 93.2 fL (ref 80.0–100.0)
Platelets: 227 10*3/uL (ref 150–400)
RBC: 4.42 MIL/uL (ref 3.87–5.11)
RDW: 13.2 % (ref 11.5–15.5)
WBC: 9.1 10*3/uL (ref 4.0–10.5)
nRBC: 0 % (ref 0.0–0.2)

## 2022-02-07 LAB — BLOOD GAS, VENOUS
Acid-Base Excess: 0.3 mmol/L (ref 0.0–2.0)
Bicarbonate: 24.6 mmol/L (ref 20.0–28.0)
Drawn by: 6381
FIO2: 21 %
O2 Saturation: 88.2 %
Patient temperature: 36.5
pCO2, Ven: 37 mmHg — ABNORMAL LOW (ref 44–60)
pH, Ven: 7.43 (ref 7.25–7.43)
pO2, Ven: 53 mmHg — ABNORMAL HIGH (ref 32–45)

## 2022-02-07 LAB — CBG MONITORING, ED: Glucose-Capillary: 71 mg/dL (ref 70–99)

## 2022-02-07 LAB — HEPATIC FUNCTION PANEL
ALT: 18 U/L (ref 0–44)
AST: 15 U/L (ref 15–41)
Albumin: 3.7 g/dL (ref 3.5–5.0)
Alkaline Phosphatase: 70 U/L (ref 38–126)
Bilirubin, Direct: 0.1 mg/dL (ref 0.0–0.2)
Indirect Bilirubin: 0.4 mg/dL (ref 0.3–0.9)
Total Bilirubin: 0.5 mg/dL (ref 0.3–1.2)
Total Protein: 7.5 g/dL (ref 6.5–8.1)

## 2022-02-07 LAB — BASIC METABOLIC PANEL
Anion gap: 9 (ref 5–15)
BUN: 19 mg/dL (ref 6–20)
CO2: 21 mmol/L — ABNORMAL LOW (ref 22–32)
Calcium: 8.8 mg/dL — ABNORMAL LOW (ref 8.9–10.3)
Chloride: 109 mmol/L (ref 98–111)
Creatinine, Ser: 0.6 mg/dL (ref 0.44–1.00)
GFR, Estimated: >60 mL/min (ref >60)
Glucose, Bld: 155 mg/dL — ABNORMAL HIGH (ref 70–99)
Potassium: 4.1 mmol/L (ref 3.5–5.1)
Sodium: 139 mmol/L (ref 135–145)

## 2022-02-07 LAB — TROPONIN I (HIGH SENSITIVITY)
Troponin I (High Sensitivity): 20 ng/L — ABNORMAL HIGH (ref <18)
Troponin I (High Sensitivity): 21 ng/L — ABNORMAL HIGH (ref <18)

## 2022-02-07 LAB — MAGNESIUM: Magnesium: 1.9 mg/dL (ref 1.7–2.4)

## 2022-02-07 LAB — LIPASE, BLOOD: Lipase: 37 U/L (ref 11–51)

## 2022-02-07 LAB — BRAIN NATRIURETIC PEPTIDE: B Natriuretic Peptide: 221 pg/mL — ABNORMAL HIGH (ref 0.0–100.0)

## 2022-02-07 IMAGING — DX DG CHEST 2V
2 series · 2 of 2 positions shown · non-contrast
Comparison: [DATE] chest x-ray and CT, chest x-ray [DATE]

CLINICAL DATA: Chest pain

EXAM:
CHEST - 2 VIEW

[chest pa]
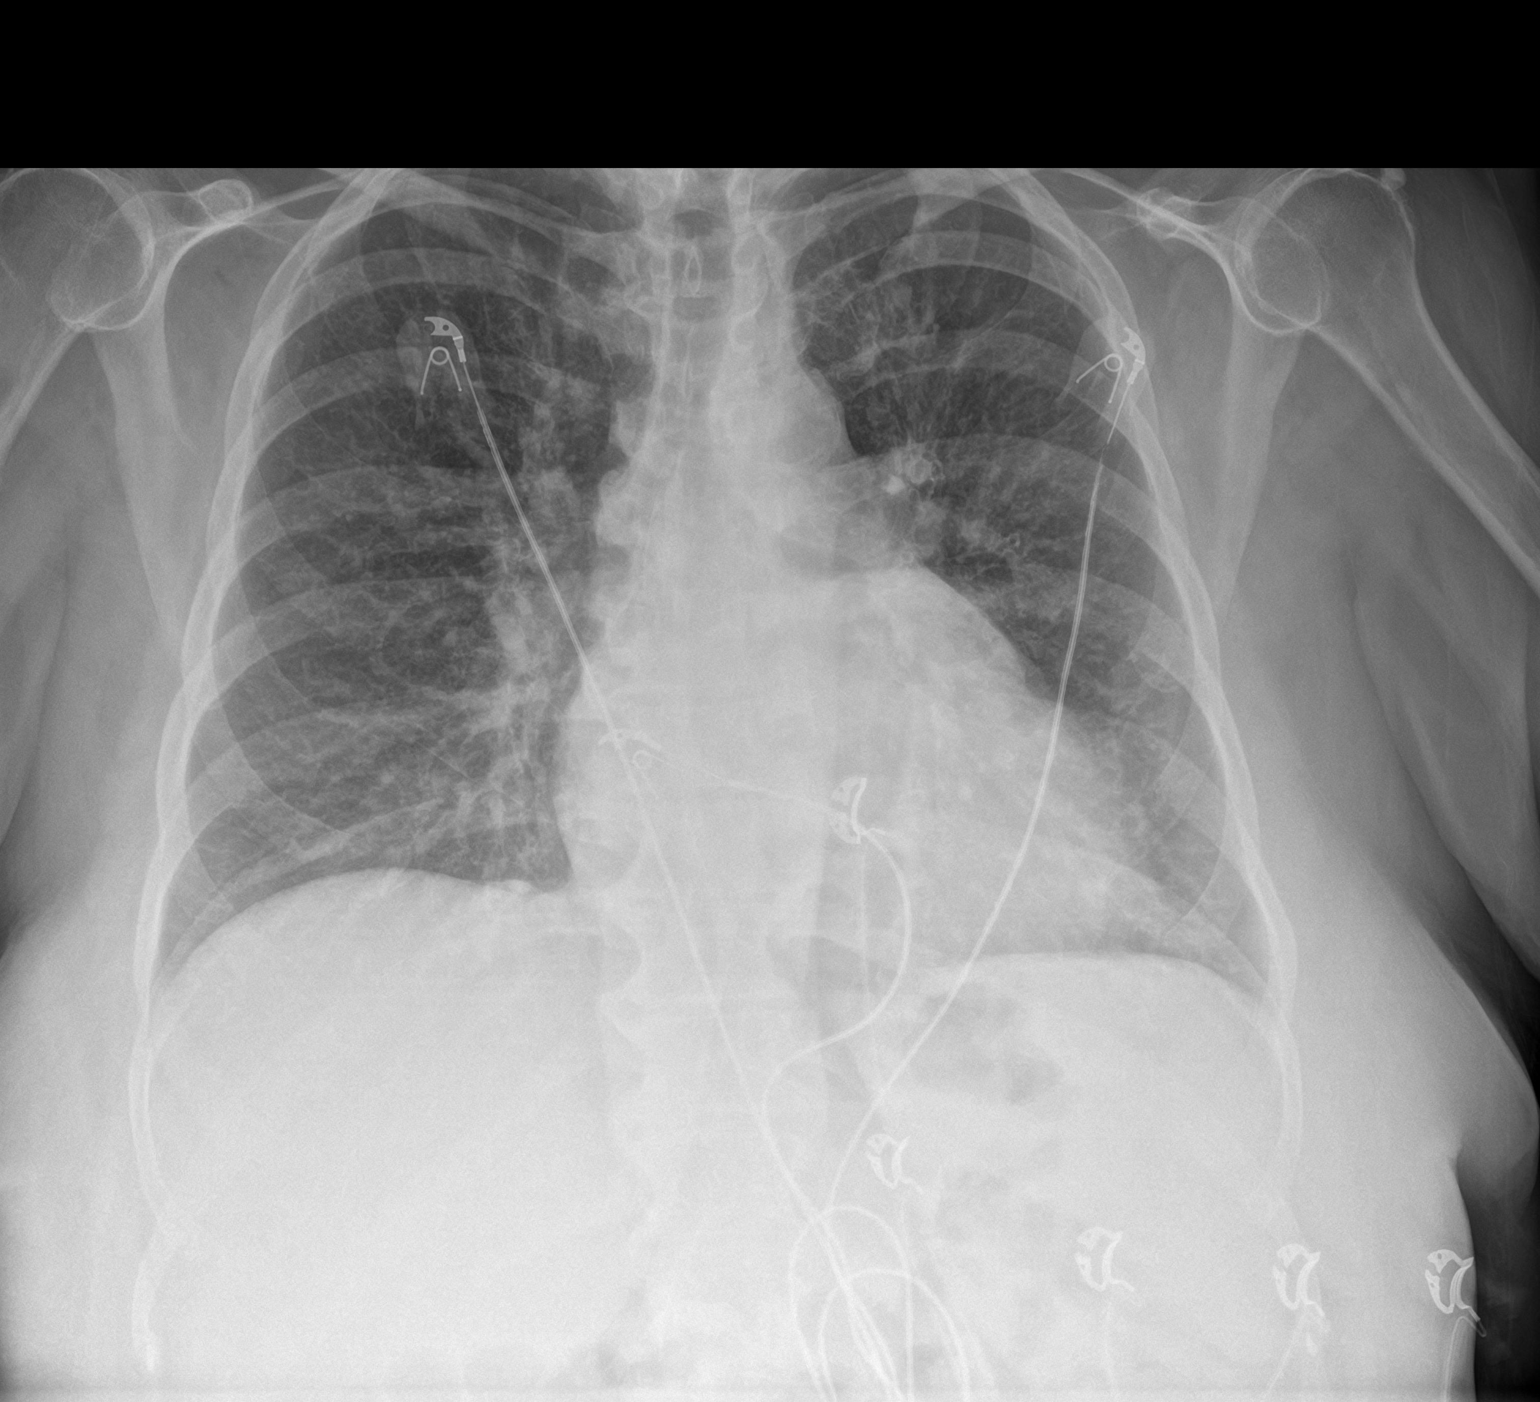

[chest lat]
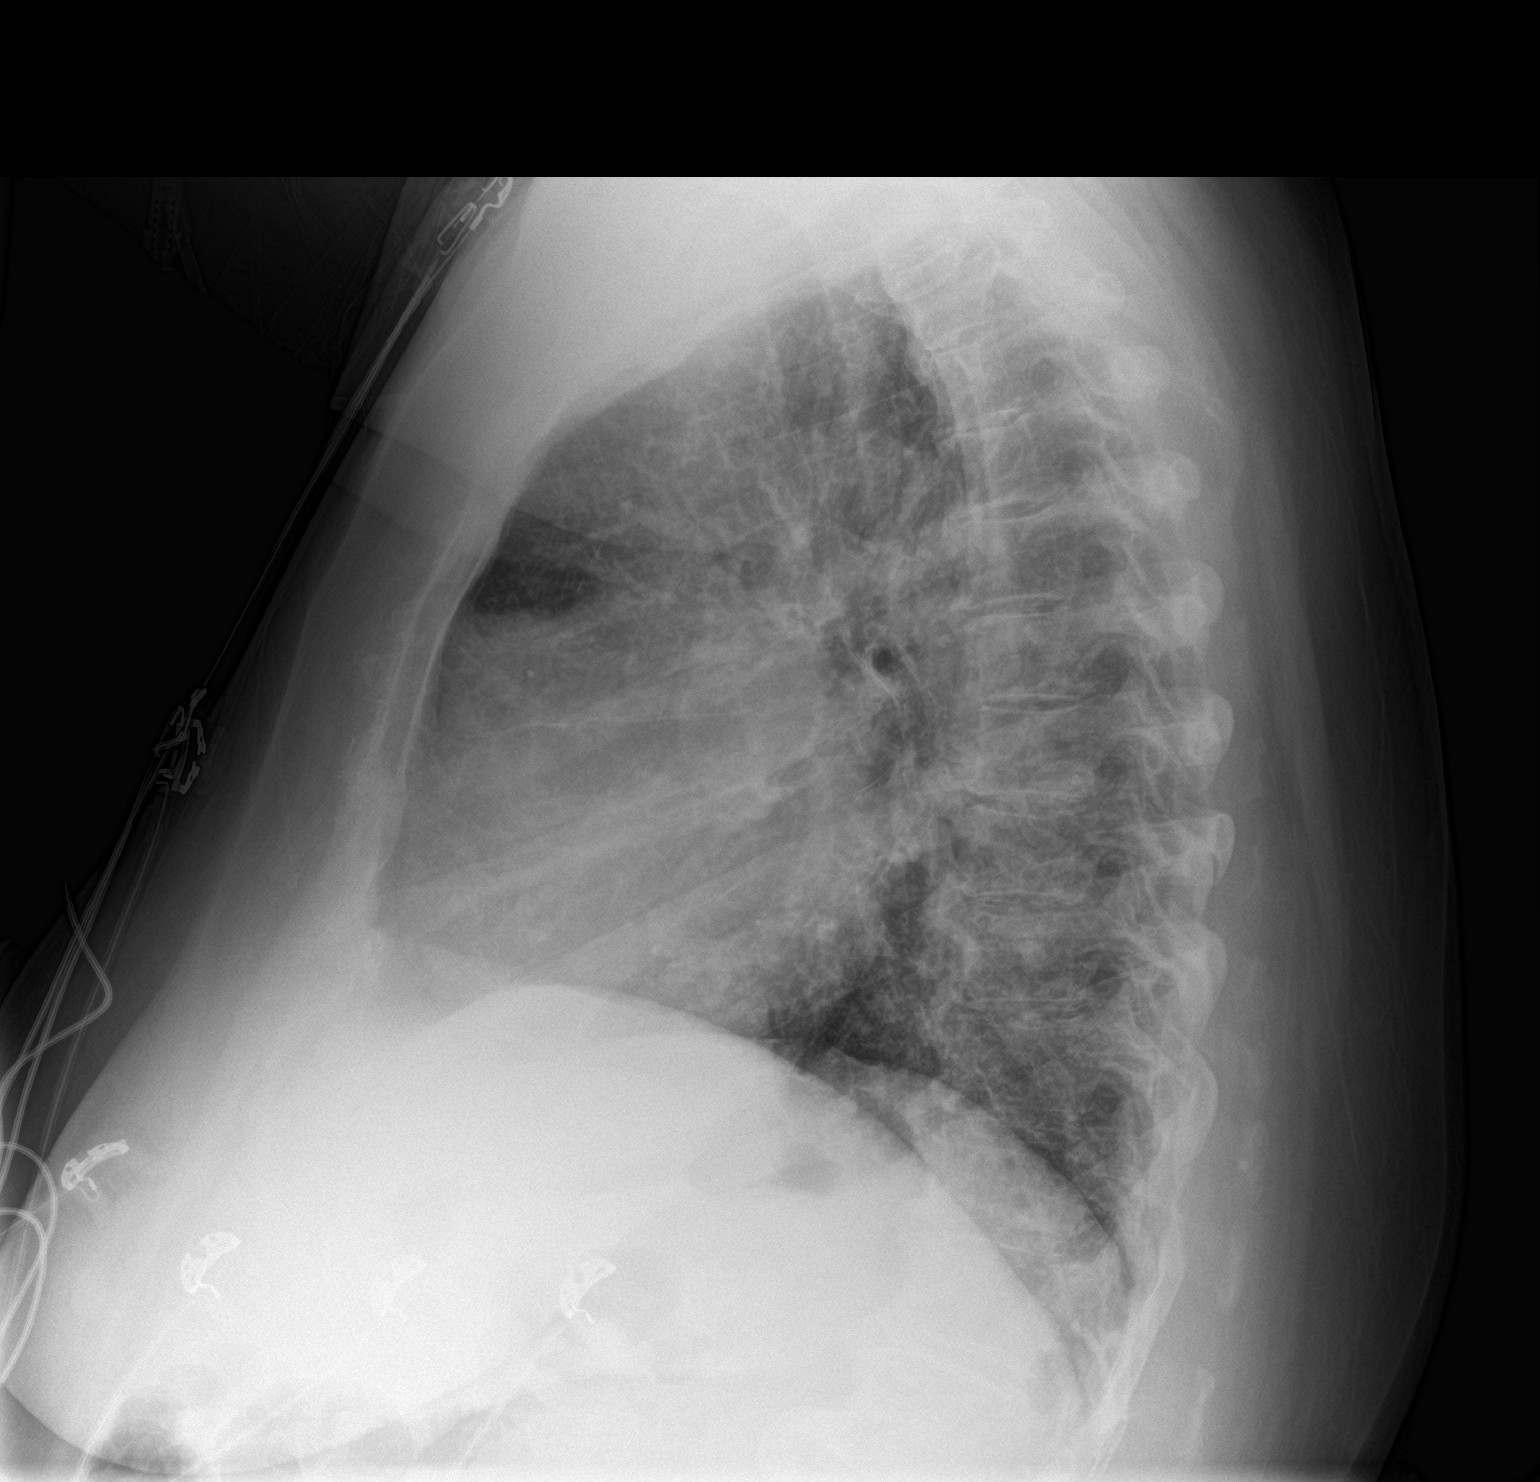

[2 of 2 positions shown; findings below may reference images not displayed]

FINDINGS: Stable cardiomediastinal silhouette no focal opacity, pleural
effusion, or pneumothorax. Aortic atherosclerosis.
IMPRESSION: No active cardiopulmonary disease.

## 2022-02-07 MED ORDER — LEVOTHYROXINE SODIUM 112 MCG PO TABS
112.0000 ug | ORAL_TABLET | Freq: Every day | ORAL | Status: DC
  Administered 2022-02-08 – 2022-02-14 (×7): 112 ug via ORAL
  Filled 2022-02-07 (×7): qty 1

## 2022-02-07 MED ORDER — POLYETHYLENE GLYCOL 3350 17 G PO PACK
17.0000 g | PACK | Freq: Every day | ORAL | Status: DC | PRN
  Administered 2022-02-08 – 2022-02-13 (×7): 17 g via ORAL
  Filled 2022-02-07 (×6): qty 1

## 2022-02-07 MED ORDER — BISOPROLOL FUMARATE 5 MG PO TABS
2.5000 mg | ORAL_TABLET | Freq: Every day | ORAL | Status: DC
  Administered 2022-02-08 – 2022-02-09 (×2): 2.5 mg via ORAL
  Filled 2022-02-07 (×3): qty 1

## 2022-02-07 MED ORDER — ADULT MULTIVITAMIN W/MINERALS CH
1.0000 | ORAL_TABLET | Freq: Every day | ORAL | Status: DC
  Administered 2022-02-08 – 2022-02-14 (×7): 1 via ORAL
  Filled 2022-02-07 (×7): qty 1

## 2022-02-07 MED ORDER — PANTOPRAZOLE SODIUM 40 MG PO TBEC
40.0000 mg | DELAYED_RELEASE_TABLET | Freq: Every day | ORAL | Status: DC
  Administered 2022-02-08 – 2022-02-14 (×7): 40 mg via ORAL
  Filled 2022-02-07 (×8): qty 1

## 2022-02-07 MED ORDER — KETOROLAC TROMETHAMINE 15 MG/ML IJ SOLN
15.0000 mg | Freq: Once | INTRAMUSCULAR | Status: AC
  Administered 2022-02-07: 15 mg via INTRAVENOUS
  Filled 2022-02-07: qty 1

## 2022-02-07 MED ORDER — LINACLOTIDE 145 MCG PO CAPS
145.0000 ug | ORAL_CAPSULE | Freq: Every day | ORAL | Status: DC
  Administered 2022-02-08 – 2022-02-14 (×7): 145 ug via ORAL
  Filled 2022-02-07 (×7): qty 1

## 2022-02-07 MED ORDER — MORPHINE SULFATE (PF) 4 MG/ML IV SOLN
4.0000 mg | Freq: Once | INTRAVENOUS | Status: AC
Start: 2022-02-07 — End: 2022-02-07
  Administered 2022-02-07: 4 mg via INTRAVENOUS
  Filled 2022-02-07: qty 1

## 2022-02-07 MED ORDER — HYDROXYZINE HCL 25 MG PO TABS
25.0000 mg | ORAL_TABLET | Freq: Four times a day (QID) | ORAL | Status: DC | PRN
  Administered 2022-02-09 – 2022-02-13 (×11): 25 mg via ORAL
  Filled 2022-02-07 (×12): qty 1

## 2022-02-07 MED ORDER — ALBUTEROL SULFATE (2.5 MG/3ML) 0.083% IN NEBU
2.5000 mg | INHALATION_SOLUTION | Freq: Once | RESPIRATORY_TRACT | Status: DC
  Filled 2022-02-07: qty 3

## 2022-02-07 MED ORDER — ATORVASTATIN CALCIUM 10 MG PO TABS
10.0000 mg | ORAL_TABLET | Freq: Every evening | ORAL | Status: DC
  Administered 2022-02-07 – 2022-02-13 (×7): 10 mg via ORAL
  Filled 2022-02-07 (×7): qty 1

## 2022-02-07 MED ORDER — ONDANSETRON HCL 4 MG/2ML IJ SOLN
4.0000 mg | Freq: Four times a day (QID) | INTRAMUSCULAR | Status: DC | PRN
  Administered 2022-02-10: 4 mg via INTRAVENOUS
  Filled 2022-02-07: qty 2

## 2022-02-07 MED ORDER — LIDOCAINE 5 % EX PTCH
1.0000 | MEDICATED_PATCH | CUTANEOUS | Status: DC
Start: 2022-02-07 — End: 2022-02-14
  Administered 2022-02-07 – 2022-02-13 (×7): 1 via TRANSDERMAL
  Filled 2022-02-07 (×7): qty 1

## 2022-02-07 MED ORDER — VITAMIN B-12 1000 MCG PO TABS
1000.0000 ug | ORAL_TABLET | Freq: Every day | ORAL | Status: DC
  Administered 2022-02-08 – 2022-02-14 (×7): 1000 ug via ORAL
  Filled 2022-02-07 (×7): qty 1

## 2022-02-07 MED ORDER — MELATONIN 3 MG PO TABS
6.0000 mg | ORAL_TABLET | Freq: Every day | ORAL | Status: DC
  Administered 2022-02-07 – 2022-02-13 (×7): 6 mg via ORAL
  Filled 2022-02-07 (×7): qty 2

## 2022-02-07 MED ORDER — ACETAMINOPHEN 325 MG PO TABS: 650.0000 mg | ORAL_TABLET | Freq: Four times a day (QID) | ORAL | Status: DC | PRN

## 2022-02-07 MED ORDER — SODIUM CHLORIDE 0.9% FLUSH
3.0000 mL | Freq: Two times a day (BID) | INTRAVENOUS | Status: DC
  Administered 2022-02-07 – 2022-02-14 (×13): 3 mL via INTRAVENOUS

## 2022-02-07 MED ORDER — APIXABAN 5 MG PO TABS
5.0000 mg | ORAL_TABLET | Freq: Two times a day (BID) | ORAL | Status: DC
  Administered 2022-02-07 – 2022-02-14 (×14): 5 mg via ORAL
  Filled 2022-02-07 (×14): qty 1

## 2022-02-07 MED ORDER — MONTELUKAST SODIUM 10 MG PO TABS
10.0000 mg | ORAL_TABLET | Freq: Every day | ORAL | Status: DC
  Administered 2022-02-08 – 2022-02-14 (×7): 10 mg via ORAL
  Filled 2022-02-07 (×7): qty 1

## 2022-02-07 MED ORDER — TOPIRAMATE 100 MG PO TABS
100.0000 mg | ORAL_TABLET | Freq: Two times a day (BID) | ORAL | Status: DC
  Administered 2022-02-07 – 2022-02-14 (×13): 100 mg via ORAL
  Filled 2022-02-07 (×14): qty 1

## 2022-02-07 MED ORDER — FERROUS SULFATE 325 (65 FE) MG PO TABS
325.0000 mg | ORAL_TABLET | Freq: Every evening | ORAL | Status: DC
  Administered 2022-02-07 – 2022-02-13 (×7): 325 mg via ORAL
  Filled 2022-02-07 (×7): qty 1

## 2022-02-07 MED ORDER — ACETAMINOPHEN 325 MG PO TABS
650.0000 mg | ORAL_TABLET | Freq: Once | ORAL | Status: AC
Start: 2022-02-07 — End: 2022-02-07
  Administered 2022-02-07: 650 mg via ORAL
  Filled 2022-02-07: qty 2

## 2022-02-07 MED ORDER — INSULIN GLARGINE-YFGN 100 UNIT/ML ~~LOC~~ SOLN
80.0000 [IU] | Freq: Every day | SUBCUTANEOUS | Status: DC
  Administered 2022-02-07 – 2022-02-09 (×3): 80 [IU] via SUBCUTANEOUS
  Filled 2022-02-07 (×4): qty 0.8

## 2022-02-07 MED ORDER — METFORMIN HCL 500 MG PO TABS
500.0000 mg | ORAL_TABLET | Freq: Two times a day (BID) | ORAL | Status: DC
Start: 2022-02-08 — End: 2022-02-14
  Administered 2022-02-08 – 2022-02-14 (×13): 500 mg via ORAL
  Filled 2022-02-07 (×13): qty 1

## 2022-02-07 MED ORDER — DOXYCYCLINE HYCLATE 100 MG PO TABS
100.0000 mg | ORAL_TABLET | Freq: Two times a day (BID) | ORAL | Status: DC
  Administered 2022-02-07 – 2022-02-12 (×10): 100 mg via ORAL
  Filled 2022-02-07 (×10): qty 1

## 2022-02-07 MED ORDER — DILTIAZEM HCL 30 MG PO TABS
60.0000 mg | ORAL_TABLET | Freq: Four times a day (QID) | ORAL | Status: DC
  Administered 2022-02-07 – 2022-02-08 (×3): 60 mg via ORAL
  Filled 2022-02-07: qty 2
  Filled 2022-02-07 (×2): qty 1

## 2022-02-07 MED ORDER — IPRATROPIUM BROMIDE 0.02 % IN SOLN
0.5000 mg | RESPIRATORY_TRACT | Status: DC | PRN
  Administered 2022-02-07: 0.5 mg via RESPIRATORY_TRACT
  Filled 2022-02-07: qty 2.5

## 2022-02-07 MED ORDER — DIVALPROEX SODIUM ER 500 MG PO TB24
750.0000 mg | ORAL_TABLET | Freq: Two times a day (BID) | ORAL | Status: DC
  Administered 2022-02-07 – 2022-02-14 (×14): 750 mg via ORAL
  Filled 2022-02-07 (×18): qty 1

## 2022-02-07 MED ORDER — LEVALBUTEROL HCL 0.63 MG/3ML IN NEBU
0.6300 mg | INHALATION_SOLUTION | RESPIRATORY_TRACT | Status: DC | PRN
  Administered 2022-02-07 – 2022-02-09 (×2): 0.63 mg via RESPIRATORY_TRACT
  Filled 2022-02-07 (×2): qty 3

## 2022-02-07 MED ORDER — FLUTICASONE FUROATE-VILANTEROL 100-25 MCG/ACT IN AEPB
1.0000 | INHALATION_SPRAY | Freq: Every day | RESPIRATORY_TRACT | Status: DC
  Administered 2022-02-08 – 2022-02-14 (×7): 1 via RESPIRATORY_TRACT
  Filled 2022-02-07: qty 28

## 2022-02-07 MED ORDER — LACTATED RINGERS IV SOLN: INTRAVENOUS | Status: DC

## 2022-02-07 MED ORDER — ASPIRIN 81 MG PO CHEW
324.0000 mg | CHEWABLE_TABLET | Freq: Once | ORAL | Status: DC
  Filled 2022-02-07: qty 4

## 2022-02-07 MED ORDER — METOCLOPRAMIDE HCL 10 MG PO TABS
5.0000 mg | ORAL_TABLET | Freq: Three times a day (TID) | ORAL | Status: DC
  Administered 2022-02-07 – 2022-02-14 (×20): 5 mg via ORAL
  Filled 2022-02-07 (×20): qty 1

## 2022-02-07 MED ORDER — INSULIN GLARGINE 100 UNIT/ML SOLOSTAR PEN: 80.0000 [IU] | PEN_INJECTOR | Freq: Every day | SUBCUTANEOUS | Status: DC

## 2022-02-07 MED ORDER — ASCORBIC ACID 500 MG PO TABS
500.0000 mg | ORAL_TABLET | Freq: Every day | ORAL | Status: DC
  Administered 2022-02-08 – 2022-02-14 (×7): 500 mg via ORAL
  Filled 2022-02-07 (×7): qty 1

## 2022-02-07 MED ORDER — AEROCHAMBER PLUS MISC: 1.0000 | 0 refills | Status: DC | PRN

## 2022-02-07 MED ORDER — HYDROCODONE-ACETAMINOPHEN 5-325 MG PO TABS
1.0000 | ORAL_TABLET | ORAL | Status: DC | PRN
  Administered 2022-02-07 (×2): 1 via ORAL
  Administered 2022-02-08 – 2022-02-14 (×27): 2 via ORAL
  Filled 2022-02-07 (×26): qty 2
  Filled 2022-02-07: qty 1
  Filled 2022-02-07 (×2): qty 2

## 2022-02-07 MED ORDER — DAPAGLIFLOZIN PROPANEDIOL 5 MG PO TABS
5.0000 mg | ORAL_TABLET | Freq: Every day | ORAL | Status: DC
  Administered 2022-02-09 – 2022-02-11 (×3): 5 mg via ORAL
  Filled 2022-02-07 (×7): qty 1

## 2022-02-07 MED ORDER — PREDNISONE 20 MG PO TABS
40.0000 mg | ORAL_TABLET | Freq: Every day | ORAL | Status: DC
  Administered 2022-02-08 – 2022-02-13 (×6): 40 mg via ORAL
  Filled 2022-02-07 (×7): qty 2

## 2022-02-07 MED ORDER — POTASSIUM CHLORIDE CRYS ER 20 MEQ PO TBCR
20.0000 meq | EXTENDED_RELEASE_TABLET | Freq: Every day | ORAL | Status: DC
  Administered 2022-02-08 – 2022-02-14 (×7): 20 meq via ORAL
  Filled 2022-02-07 (×7): qty 1

## 2022-02-07 MED ORDER — MORPHINE SULFATE (PF) 4 MG/ML IV SOLN
4.0000 mg | Freq: Once | INTRAVENOUS | Status: AC
  Administered 2022-02-07: 4 mg via INTRAVENOUS
  Filled 2022-02-07: qty 1

## 2022-02-07 MED ORDER — INSULIN ASPART 100 UNIT/ML IJ SOLN
0.0000 [IU] | Freq: Three times a day (TID) | INTRAMUSCULAR | Status: DC
  Administered 2022-02-08: 8 [IU] via SUBCUTANEOUS
  Administered 2022-02-08: 11 [IU] via SUBCUTANEOUS
  Administered 2022-02-09: 8 [IU] via SUBCUTANEOUS
  Administered 2022-02-09: 2 [IU] via SUBCUTANEOUS
  Administered 2022-02-09 – 2022-02-10 (×3): 5 [IU] via SUBCUTANEOUS
  Administered 2022-02-11: 3 [IU] via SUBCUTANEOUS
  Administered 2022-02-12: 2 [IU] via SUBCUTANEOUS
  Administered 2022-02-12 – 2022-02-13 (×3): 3 [IU] via SUBCUTANEOUS
  Administered 2022-02-13: 8 [IU] via SUBCUTANEOUS
  Administered 2022-02-13 – 2022-02-14 (×2): 2 [IU] via SUBCUTANEOUS
  Administered 2022-02-14: 3 [IU] via SUBCUTANEOUS

## 2022-02-07 MED ORDER — BUDESONIDE 0.5 MG/2ML IN SUSP
0.5000 mg | Freq: Two times a day (BID) | RESPIRATORY_TRACT | Status: DC
  Administered 2022-02-08 – 2022-02-14 (×13): 0.5 mg via RESPIRATORY_TRACT
  Filled 2022-02-07 (×13): qty 2

## 2022-02-07 MED ORDER — FLUTICASONE PROPIONATE HFA 110 MCG/ACT IN AERO: 2.0000 | INHALATION_SPRAY | Freq: Two times a day (BID) | RESPIRATORY_TRACT | Status: DC

## 2022-02-07 MED ORDER — GABAPENTIN 300 MG PO CAPS
600.0000 mg | ORAL_CAPSULE | Freq: Three times a day (TID) | ORAL | Status: DC
  Administered 2022-02-07 – 2022-02-14 (×20): 600 mg via ORAL
  Filled 2022-02-07 (×20): qty 2

## 2022-02-07 MED ORDER — ONDANSETRON HCL 4 MG PO TABS: 4.0000 mg | ORAL_TABLET | Freq: Four times a day (QID) | ORAL | Status: DC | PRN

## 2022-02-07 NOTE — Progress Notes (Signed)
MD came into room after I had put Albuterol in neb cup.  Patient in Afib all the time, so he wanted it changed to Xopenex/Atrovent.  Wasted Albuterol and put in new order.  Patient having occasional wheezing. ?
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
## 2022-02-07 NOTE — Telephone Encounter (Signed)
I just spoke with patient. I advised her to go directly to the ED for evaluation and do not drive or operate any machinery until she has been thoroughly evaluated.She agrees to be evaluated. ? ? ? ? ?I will FYI Dr.Branch. ?

## 2022-02-07 NOTE — Progress Notes (Signed)
? ?[address], [place] [address] ?Dept: [phone] ? ?FOLLOW UP NOTE ? ?Patient ID: Laura [name], female    DOB: Nov 14, 1960  Age: 61 y.o. MRN: [id number] ?Date of Office Visit: 02/07/2022 ? ?Assessment  ?Chief Complaint: Cough (Says she has bronchitis. ) ? ?HPI ?Laura [name] is a 61 year old female who presents the clinic for follow-up visit.  She was last seen in this clinic on 01/31/2022 by Gareth Morgan, FNP, via televisit for evaluation of respiratory distress for which she went to the emergency department.  She was previously seen on 12/31/2021 by Gareth Morgan, FNP, via telephone, for evaluation of asthma, chronic rhinitis, reflux, and CVID on cuvitru infusions.  At today's visit, she reports her asthma has been symptoms including frequent shortness of breath and heavy breathing which is worse with activity and occasional cough producing thick mucus which is beginning to  "break up now ".  She continues montelukast 10 mg once a day, Breo 100-1 puff once a day, Flovent 110-2 puffs twice a day for the next 2 weeks, and lev albuterol about every 4 hours.  She continues Nucala injections once every 4 weeks.  Chronic rhinitis is reported as moderately well controlled with symptoms including clear nasal drainage and nasal congestion.  She continues cetirizine 10 mg once a day and uses Flonase and nasal saline rinses only as needed.Her last environmental allergy testing was via lab on 02/08/2018 and was negative to the panel.  Reflux is reported as well controlled with omeprazole 20 mg once a day in the morning 30 minutes before the first meal.  She reports that she has had several infections over the last year including COVID 2 times and norovirus 1 time.  She denies sinus infections or pneumonia.  She has taken doxycycline 1 time over the last year.  She continues Cuvitru infusions 25 mg once every other week without issues or adverse reactions.  Of note, she reports a recent weight gain of 4 pounds  overnight and has a call into her cardiologist for further evaluation.  Her current medications are listed in the chart.   ? ? ?Drug Allergies:  ?Allergies  ?Allergen Reactions  ? Ativan [Lorazepam] Hives  ? Phenergan [Promethazine Hcl] Hives  ? ? ?Physical Exam: ?BP 140/70   Pulse 74   Temp 98.6 ?F (37 ?C) (Temporal)   Resp 16   Ht '5\' 3"'$  (1.6 m)   Wt 211 lb 6.4 oz (95.9 kg) Comment: 209 without clothes at home  SpO2 98%   BMI 37.45 kg/m?   ? ?Physical Exam ?Vitals reviewed.  ?Constitutional:   ?   Appearance: Normal appearance.  ?HENT:  ?   Head: Normocephalic and atraumatic.  ?   Right Ear: Tympanic membrane normal.  ?   Left Ear: Tympanic membrane normal.  ?   Nose:  ?   Comments: Bilateral nares slightly erythematous with clear nasal drainage noted.  Pharynx normal.  Ears normal.  Eyes normal. ?   Mouth/Throat:  ?   Pharynx: Oropharynx is clear.  ?Eyes:  ?   Conjunctiva/sclera: Conjunctivae normal.  ?Cardiovascular:  ?   Rate and Rhythm: Normal rate and regular rhythm.  ?   Heart sounds: Normal heart sounds. No murmur heard. ?Pulmonary:  ?   Effort: Pulmonary effort is normal.  ?   Breath sounds: Normal breath sounds.  ?   Comments: Lungs clear to auscultation ?Musculoskeletal:     ?   General: Normal range of motion.  ?  Cervical back: Normal range of motion and neck supple.  ?Skin: ?   General: Skin is warm and dry.  ?Neurological:  ?   Mental Status: She is alert and oriented to person, place, and time.  ?Psychiatric:     ?   Mood and Affect: Mood normal.     ?   Behavior: Behavior normal.     ?   Thought Content: Thought content normal.     ?   Judgment: Judgment normal.  ? ? ?Diagnostics: ?FVC 2.47, FEV1 1.81.  Predicted FVC 3.05, predicted FEV1 2.41.  Spirometry indicates normal ventilatory function. ? ?Assessment and Plan: ?1. Not well controlled severe persistent asthma   ?2. Acute cough   ?3. Non-allergic rhinitis   ?4. 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 ? ?Meds ordered this encounter  ?Medications  ? Spacer/Aero-Holding Chambers (AEROCHAMBER PLUS) inhaler  ?  Sig: 1 each by Other route as needed for other. Use as instructed  ?  Dispense:  1 each  ?  Refill:  0  ? ? ?Patient Instructions  ?Asthma ?Continue montelukast 10 mg once a day to prevent cough or wheeze ?Continue Breo 100-1 puff once a day to prevent cough or wheeze ?Continue Flovent 110-2 puffs twice a day for the next 2 weeks or until cough and wheeze free ?Continue levalbuterol 2 puffs every 6 hours as needed for cough or wheeze OR instead use levalbuterol solution via nebulizer one unit vial every 6 hours as needed for cough or wheeze For the next 2 weeks use levalbuterol 10 minutes before using Breo and Flovent in the morning to really get the inhaled medications in good position ?Continue Nucala injections once every 4 weeks ?Finish prednisone and Doxycycline as ordered by your primary care provider ? ?Chronic rhinitis ?Continue cetirizine 10 mg once a day as needed for a runny nose or itch ?Begin Flonase 2 sprays in each nostril once a day for a stuffy nose ?Consider saline nasal rinses as needed for nasal symptoms. Use this before any medicated nasal sprays for best result ? ?Reflux ?Continue dietary and lifestyle modifications as listed below ?Increase omeprazole to 20 mg twice a day. Take this medication 30 minutes before your first meal ? ?Cough ?Call the clinic if your cough worsens or if you develop a fever  ?Begin Tessalon Perles 200 mg up to three times a day as needed- call your insurance company to check insurance coverage ? ?CVID ?Continue Cuvitru once every other week ?Continue to keep track of infections, antibiotic use and prednisone use ? ?Call the clinic if this treatment plan is not working well for you ? ?Follow up in the clinic in 1 month or sooner if needed. ? ?Return in about 4 weeks (around 03/07/2022), or if symptoms worsen or fail  to improve. ?  ? ?Thank you for the opportunity to care for this patient.  Please do not hesitate to contact me with questions. ? ?Gareth Morgan, FNP ?[facility] ? ? ? ? ? ?

## 2022-02-07 NOTE — ED Notes (Signed)
Attempted to call report to [phone].Nurse to call back when she gets out of a pt's room ? ?
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
## 2022-02-07 NOTE — ED Notes (Signed)
Report called to Norma, RN

## 2022-02-07 NOTE — ED Notes (Signed)
Patient transported to X-ray 
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
## 2022-02-07 NOTE — Patient Instructions (Addendum)
Asthma ?Continue montelukast 10 mg once a day to prevent cough or wheeze ?Continue Breo 100-1 puff once a day to prevent cough or wheeze ?Continue Flovent 110-2 puffs twice a day for the next 2 weeks or until cough and wheeze free ?Continue levalbuterol 2 puffs every 6 hours as needed for cough or wheeze OR instead use levalbuterol solution via nebulizer one unit vial every 6 hours as needed for cough or wheeze For the next 2 weeks use levalbuterol 10 minutes before using Breo and Flovent in the morning to really get the inhaled medications in good position ?Continue Nucala injections once every 4 weeks ?Finish prednisone and Doxycycline as ordered by your primary care provider ? ?Chronic rhinitis ?Continue cetirizine 10 mg once a day as needed for a runny nose or itch ?Begin Flonase 2 sprays in each nostril once a day for a stuffy nose ?Consider saline nasal rinses as needed for nasal symptoms. Use this before any medicated nasal sprays for best result ? ?Reflux ?Continue dietary and lifestyle modifications as listed below ?Increase omeprazole to 20 mg twice a day. Take this medication 30 minutes before your first meal ? ?Cough ?Call the clinic if your cough worsens or if you develop a fever  ?Begin Tessalon Perles 200 mg up to three times a day as needed- call your insurance company to check insurance coverage ? ?CVID ?Continue Cuvitru once every other week ?Continue to keep track of infections, antibiotic use and prednisone use ? ?Call the clinic if this treatment plan is not working well for you ? ?Follow up in the clinic in 1 month or sooner if needed. ?

## 2022-02-08 ENCOUNTER — Other Ambulatory Visit: Payer: Self-pay | Admitting: "Endocrinology

## 2022-02-08 ENCOUNTER — Observation Stay (HOSPITAL_COMMUNITY): Payer: Medicare Other

## 2022-02-08 ENCOUNTER — Encounter: Payer: Self-pay | Admitting: Family Medicine

## 2022-02-08 DIAGNOSIS — R55 Syncope and collapse: Secondary | ICD-10-CM

## 2022-02-08 DIAGNOSIS — E1165 Type 2 diabetes mellitus with hyperglycemia: Secondary | ICD-10-CM | POA: Diagnosis not present

## 2022-02-08 DIAGNOSIS — Z9989 Dependence on other enabling machines and devices: Secondary | ICD-10-CM | POA: Diagnosis not present

## 2022-02-08 DIAGNOSIS — G4733 Obstructive sleep apnea (adult) (pediatric): Secondary | ICD-10-CM | POA: Diagnosis not present

## 2022-02-08 DIAGNOSIS — D801 Nonfamilial hypogammaglobulinemia: Secondary | ICD-10-CM | POA: Diagnosis present

## 2022-02-08 DIAGNOSIS — Z7985 Long-term (current) use of injectable non-insulin antidiabetic drugs: Secondary | ICD-10-CM | POA: Diagnosis not present

## 2022-02-08 DIAGNOSIS — Z8379 Family history of other diseases of the digestive system: Secondary | ICD-10-CM | POA: Diagnosis not present

## 2022-02-08 DIAGNOSIS — E785 Hyperlipidemia, unspecified: Secondary | ICD-10-CM | POA: Diagnosis present

## 2022-02-08 DIAGNOSIS — I110 Hypertensive heart disease with heart failure: Secondary | ICD-10-CM | POA: Diagnosis present

## 2022-02-08 DIAGNOSIS — F319 Bipolar disorder, unspecified: Secondary | ICD-10-CM | POA: Diagnosis present

## 2022-02-08 DIAGNOSIS — E1142 Type 2 diabetes mellitus with diabetic polyneuropathy: Secondary | ICD-10-CM | POA: Diagnosis present

## 2022-02-08 DIAGNOSIS — I050 Rheumatic mitral stenosis: Secondary | ICD-10-CM

## 2022-02-08 DIAGNOSIS — I4892 Unspecified atrial flutter: Secondary | ICD-10-CM | POA: Diagnosis present

## 2022-02-08 DIAGNOSIS — Z8249 Family history of ischemic heart disease and other diseases of the circulatory system: Secondary | ICD-10-CM | POA: Diagnosis not present

## 2022-02-08 DIAGNOSIS — I509 Heart failure, unspecified: Secondary | ICD-10-CM | POA: Diagnosis not present

## 2022-02-08 DIAGNOSIS — J440 Chronic obstructive pulmonary disease with acute lower respiratory infection: Secondary | ICD-10-CM | POA: Diagnosis present

## 2022-02-08 DIAGNOSIS — F4310 Post-traumatic stress disorder, unspecified: Secondary | ICD-10-CM | POA: Diagnosis present

## 2022-02-08 DIAGNOSIS — I4891 Unspecified atrial fibrillation: Secondary | ICD-10-CM | POA: Diagnosis not present

## 2022-02-08 DIAGNOSIS — Z79899 Other long term (current) drug therapy: Secondary | ICD-10-CM | POA: Diagnosis not present

## 2022-02-08 DIAGNOSIS — E039 Hypothyroidism, unspecified: Secondary | ICD-10-CM | POA: Diagnosis present

## 2022-02-08 DIAGNOSIS — I5032 Chronic diastolic (congestive) heart failure: Secondary | ICD-10-CM | POA: Diagnosis not present

## 2022-02-08 DIAGNOSIS — Z888 Allergy status to other drugs, medicaments and biological substances status: Secondary | ICD-10-CM | POA: Diagnosis not present

## 2022-02-08 DIAGNOSIS — R079 Chest pain, unspecified: Secondary | ICD-10-CM | POA: Diagnosis not present

## 2022-02-08 DIAGNOSIS — D839 Common variable immunodeficiency, unspecified: Secondary | ICD-10-CM | POA: Diagnosis not present

## 2022-02-08 DIAGNOSIS — Z8042 Family history of malignant neoplasm of prostate: Secondary | ICD-10-CM | POA: Diagnosis not present

## 2022-02-08 DIAGNOSIS — T380X5A Adverse effect of glucocorticoids and synthetic analogues, initial encounter: Secondary | ICD-10-CM | POA: Diagnosis not present

## 2022-02-08 DIAGNOSIS — E669 Obesity, unspecified: Secondary | ICD-10-CM | POA: Diagnosis present

## 2022-02-08 DIAGNOSIS — I4821 Permanent atrial fibrillation: Secondary | ICD-10-CM | POA: Diagnosis present

## 2022-02-08 DIAGNOSIS — I4819 Other persistent atrial fibrillation: Secondary | ICD-10-CM | POA: Diagnosis not present

## 2022-02-08 DIAGNOSIS — J4551 Severe persistent asthma with (acute) exacerbation: Secondary | ICD-10-CM | POA: Diagnosis not present

## 2022-02-08 DIAGNOSIS — I951 Orthostatic hypotension: Secondary | ICD-10-CM | POA: Diagnosis not present

## 2022-02-08 DIAGNOSIS — Z8774 Personal history of (corrected) congenital malformations of heart and circulatory system: Secondary | ICD-10-CM | POA: Diagnosis not present

## 2022-02-08 HISTORY — DX: Rheumatic mitral stenosis: I05.0

## 2022-02-08 LAB — ECHOCARDIOGRAM COMPLETE
AR max vel: 1.66 cm2
AV Area VTI: 1.77 cm2
AV Area mean vel: 1.86 cm2
AV Mean grad: 5 mmHg
AV Peak grad: 9.4 mmHg
Ao pk vel: 1.53 m/s
Area-P 1/2: 2.4 cm2
Height: 64 in
MV VTI: 1.25 cm2
S' Lateral: 2.3 cm
Weight: 3410.96 oz

## 2022-02-08 LAB — PROCALCITONIN: Procalcitonin: <0.1 ng/mL

## 2022-02-08 LAB — BASIC METABOLIC PANEL
Anion gap: 6 (ref 5–15)
BUN: 20 mg/dL (ref 6–20)
CO2: 24 mmol/L (ref 22–32)
Calcium: 8.5 mg/dL — ABNORMAL LOW (ref 8.9–10.3)
Chloride: 109 mmol/L (ref 98–111)
Creatinine, Ser: 0.68 mg/dL (ref 0.44–1.00)
GFR, Estimated: >60 mL/min (ref >60)
Glucose, Bld: 154 mg/dL — ABNORMAL HIGH (ref 70–99)
Potassium: 3.8 mmol/L (ref 3.5–5.1)
Sodium: 139 mmol/L (ref 135–145)

## 2022-02-08 LAB — GLUCOSE, CAPILLARY
Glucose-Capillary: 135 mg/dL — ABNORMAL HIGH (ref 70–99)
Glucose-Capillary: 186 mg/dL — ABNORMAL HIGH (ref 70–99)
Glucose-Capillary: 288 mg/dL — ABNORMAL HIGH (ref 70–99)
Glucose-Capillary: 338 mg/dL — ABNORMAL HIGH (ref 70–99)
Glucose-Capillary: 340 mg/dL — ABNORMAL HIGH (ref 70–99)

## 2022-02-08 MED ORDER — PERFLUTREN LIPID MICROSPHERE
1.0000 mL | INTRAVENOUS | Status: AC | PRN
  Administered 2022-02-08: 1 mL via INTRAVENOUS
  Filled 2022-02-08: qty 10

## 2022-02-08 MED ORDER — MORPHINE SULFATE (PF) 2 MG/ML IV SOLN
2.0000 mg | INTRAVENOUS | Status: DC | PRN
  Administered 2022-02-08 – 2022-02-10 (×10): 2 mg via INTRAVENOUS
  Filled 2022-02-08 (×10): qty 1

## 2022-02-08 MED ORDER — HYDROCODONE BIT-HOMATROP MBR 5-1.5 MG/5ML PO SOLN
5.0000 mL | Freq: Four times a day (QID) | ORAL | Status: DC | PRN
  Administered 2022-02-08 – 2022-02-14 (×15): 5 mL via ORAL
  Filled 2022-02-08 (×16): qty 5

## 2022-02-08 MED ORDER — CHLORHEXIDINE GLUCONATE 0.12 % MT SOLN
15.0000 mL | Freq: Two times a day (BID) | OROMUCOSAL | Status: DC
  Administered 2022-02-09 – 2022-02-14 (×11): 15 mL via OROMUCOSAL
  Filled 2022-02-08 (×11): qty 15

## 2022-02-08 MED ORDER — INSULIN ASPART 100 UNIT/ML IJ SOLN
3.0000 [IU] | Freq: Three times a day (TID) | INTRAMUSCULAR | Status: DC
  Administered 2022-02-08 – 2022-02-14 (×17): 3 [IU] via SUBCUTANEOUS

## 2022-02-08 MED ORDER — GUAIFENESIN-DM 100-10 MG/5ML PO SYRP
10.0000 mL | ORAL_SOLUTION | ORAL | Status: DC | PRN
  Administered 2022-02-08 – 2022-02-14 (×7): 10 mL via ORAL
  Filled 2022-02-08 (×7): qty 10

## 2022-02-08 MED ORDER — ORAL CARE MOUTH RINSE
15.0000 mL | Freq: Two times a day (BID) | OROMUCOSAL | Status: DC
  Administered 2022-02-09 – 2022-02-14 (×7): 15 mL via OROMUCOSAL

## 2022-02-08 MED ORDER — DILTIAZEM HCL 30 MG PO TABS
60.0000 mg | ORAL_TABLET | Freq: Three times a day (TID) | ORAL | Status: DC
  Administered 2022-02-09: 60 mg via ORAL
  Filled 2022-02-08: qty 2

## 2022-02-08 NOTE — Progress Notes (Signed)
Patient using hospital CPAP independently. Insured correct settings were in place and machine was plugged into red outlet. Patient demonstrated knowledge of using machine correctly. ? ?
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
## 2022-02-08 NOTE — Progress Notes (Signed)
Went in to assess patient Laura [name].  Patient Laura [name] are clear and diminished at this time, no wheeze.  Patient had asked for a breathing treatment because she woke up coughing from bronchitis she is currently suffering from.  I explained that she is not wheezing and not sure if treatment would help with her coughing, so she changed her mind on treatment.  Patient had came off Biipap and put her [place] back on and wants to keep [place] on for now. ?
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
## 2022-02-08 NOTE — TOC Progression Note (Signed)
Transition of Care (TOC) - Progression Note  ? ? ?Patient Details  ?Name: Laura [name] ?MRN: [id number] ?Date of Birth: [birth year] ? ?Transition of Care (TOC) CM/SW Contact  ?Salome Arnt, LCSW ?Phone Number: ?02/08/2022, 10:23 AM ? ?Clinical Narrative:   ?Transition of Care (TOC) Screening Note ? ? ?Patient Details  ?Name: Laura [name] ?Date of Birth: 18-Nov-1960 ? ? ?Transition of Care (TOC) CM/SW Contact:    ?Salome Arnt, LCSW ?Phone Number: ?02/08/2022, 10:24 AM ? ? ? ?Transition of Care Department [facility]) has reviewed patient and no TOC needs have been identified at this time. We will continue to monitor patient advancement through interdisciplinary progression rounds. If new patient transition needs arise, please place a TOC consult. ?   ? ? ? ?  ?  ? ?Expected Discharge Plan and Services ?  ?  ?  ?  ?  ?                ?  ?  ?  ?  ?  ?  ?  ?  ?  ?  ? ? ?Social Determinants of Health (SDOH) Interventions ?  ? ?Readmission Risk Interventions ?   ? View : No data to display.  ?  ?  ?  ? ? ?

## 2022-02-09 ENCOUNTER — Telehealth: Payer: Self-pay | Admitting: Family Medicine

## 2022-02-09 ENCOUNTER — Telehealth: Payer: Self-pay | Admitting: Pulmonary Disease

## 2022-02-09 DIAGNOSIS — R55 Syncope and collapse: Secondary | ICD-10-CM | POA: Diagnosis not present

## 2022-02-09 DIAGNOSIS — I5032 Chronic diastolic (congestive) heart failure: Secondary | ICD-10-CM | POA: Diagnosis not present

## 2022-02-09 DIAGNOSIS — J4551 Severe persistent asthma with (acute) exacerbation: Secondary | ICD-10-CM

## 2022-02-09 DIAGNOSIS — G4733 Obstructive sleep apnea (adult) (pediatric): Secondary | ICD-10-CM | POA: Diagnosis not present

## 2022-02-09 LAB — CBC
HCT: 36.9 % (ref 36.0–46.0)
Hemoglobin: 12.4 g/dL (ref 12.0–15.0)
MCH: 32.9 pg (ref 26.0–34.0)
MCHC: 33.6 g/dL (ref 30.0–36.0)
MCV: 97.9 fL (ref 80.0–100.0)
Platelets: 177 10*3/uL (ref 150–400)
RBC: 3.77 MIL/uL — ABNORMAL LOW (ref 3.87–5.11)
RDW: 13.2 % (ref 11.5–15.5)
WBC: 6.2 10*3/uL (ref 4.0–10.5)
nRBC: 0 % (ref 0.0–0.2)

## 2022-02-09 LAB — GLUCOSE, CAPILLARY
Glucose-Capillary: 206 mg/dL — ABNORMAL HIGH (ref 70–99)
Glucose-Capillary: 146 mg/dL — ABNORMAL HIGH (ref 70–99)
Glucose-Capillary: 253 mg/dL — ABNORMAL HIGH (ref 70–99)
Glucose-Capillary: 251 mg/dL — ABNORMAL HIGH (ref 70–99)

## 2022-02-09 LAB — PROCALCITONIN: Procalcitonin: <0.1 ng/mL

## 2022-02-09 MED ORDER — DILTIAZEM HCL ER COATED BEADS 180 MG PO CP24
180.0000 mg | ORAL_CAPSULE | Freq: Every day | ORAL | Status: DC
  Administered 2022-02-09 – 2022-02-14 (×6): 180 mg via ORAL
  Filled 2022-02-09 (×6): qty 1

## 2022-02-09 MED ORDER — LEVALBUTEROL HCL 0.63 MG/3ML IN NEBU
0.6300 mg | INHALATION_SOLUTION | Freq: Three times a day (TID) | RESPIRATORY_TRACT | Status: DC
  Administered 2022-02-10 – 2022-02-11 (×4): 0.63 mg via RESPIRATORY_TRACT
  Filled 2022-02-09 (×4): qty 3

## 2022-02-09 NOTE — Telephone Encounter (Signed)
Called and notified patient. Nothing further needed.  °
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
## 2022-02-09 NOTE — Progress Notes (Signed)
Patient using hospital CPAP independently. Settings are correct and unit is plugged into red outlet. ?

## 2022-02-09 NOTE — Telephone Encounter (Signed)
Thanks for the update!   Laura [name]  

## 2022-02-09 NOTE — Progress Notes (Addendum)
? ?Progress Note ? ?Patient Name: Laura [name] ?Date of Encounter: 02/09/2022 ? ?[facility] Cardiologist: Carlyle Dolly, MD  ? ?Subjective  ? ?No further syncopal episodes.  States that she continues to be lightheaded when she stands ? ?Inpatient Medications  ?  ?Scheduled Meds: ? albuterol  2.5 mg Nebulization Once  ? apixaban  5 mg Oral BID  ? vitamin C  500 mg Oral Daily  ? atorvastatin  10 mg Oral QPM  ? bisoprolol  2.5 mg Oral Daily  ? budesonide (PULMICORT) nebulizer solution  0.5 mg Nebulization BID  ? chlorhexidine  15 mL Mouth Rinse BID  ? dapagliflozin propanediol  5 mg Oral QAC breakfast  ? diltiazem  60 mg Oral TID  ? divalproex  750 mg Oral BID  ? doxycycline  100 mg Oral BID  ? ferrous sulfate  325 mg Oral QPM  ? fluticasone furoate-vilanterol  1 puff Inhalation Daily  ? gabapentin  600 mg Oral TID  ? insulin aspart  0-15 Units Subcutaneous TID WC  ? insulin aspart  3 Units Subcutaneous TID WC  ? insulin glargine-yfgn  80 Units Subcutaneous QHS  ? levothyroxine  112 mcg Oral QAC breakfast  ? lidocaine  1 patch Transdermal Q24H  ? linaclotide  145 mcg Oral QAC breakfast  ? mouth rinse  15 mL Mouth Rinse q12n4p  ? melatonin  6 mg Oral QHS  ? metFORMIN  500 mg Oral BID WC  ? metoCLOPramide  5 mg Oral TID  ? montelukast  10 mg Oral Daily  ? multivitamin with minerals  1 tablet Oral Daily  ? pantoprazole  40 mg Oral Daily  ? potassium chloride SA  20 mEq Oral Daily  ? predniSONE  40 mg Oral Q breakfast  ? sodium chloride flush  3 mL Intravenous Q12H  ? topiramate  100 mg Oral BID  ? vitamin B-12  1,000 mcg Oral Daily  ? ?Continuous Infusions: ? ?PRN Meds: ?acetaminophen, guaiFENesin-dextromethorphan, HYDROcodone bit-homatropine, HYDROcodone-acetaminophen, hydrOXYzine, ipratropium, levalbuterol, morphine injection, ondansetron **OR** ondansetron (ZOFRAN) IV, polyethylene glycol  ? ?Vital Signs  ?  ?Vitals:  ? 02/08/22 2046 02/08/22 2132 02/09/22 0528 02/09/22 0706  ?BP:  (!) 134/58 (!) 157/79    ?Pulse:  (!) 50 71   ?Resp:  19 18   ?Temp:  97.9 ?F (36.6 ?C) 98.1 ?F (36.7 ?C)   ?TempSrc:      ?SpO2: 98% 100% 100% 99%  ?Weight:   94.2 kg   ?Height:      ? ? ?Intake/Output Summary (Last 24 hours) at 02/09/2022 1126 ?Last data filed at 02/09/2022 0500 ?Gross per 24 hour  ?Intake 960 ml  ?Output --  ?Net 960 ml  ? ? ?  02/09/2022  ?  5:28 AM 02/08/2022  ?  5:00 AM 02/07/2022  ?  5:30 PM  ?Last 3 Weights  ?Weight (lbs) 207 lb 10.8 oz 213 lb 3 oz 209 lb  ?Weight (kg) 94.2 kg 96.7 kg 94.802 kg  ?   ? ?Telemetry  ?  ?Atrial fibrillation rate 50s to 70s- Personally Reviewed ? ?ECG  ?  ?No new ECG- Personally Reviewed ? ?Physical Exam  ? ?GEN: No acute distress.   ?Neck: No JVD ?Cardiac: Irregular, normal rate no murmurs, rubs, or gallops.  ?Respiratory: Clear to auscultation bilaterally. ?GI: Soft, nontender, non-distended  ?MS: No edema; No deformity. ?Neuro:  Nonfocal  ?Psych: Normal affect  ? ?Labs  ?  ?High Sensitivity Troponin:   ?Recent Labs  ?Lab 02/07/22 ?  1756 02/07/22 ?1944  ?TROPONINIHS 20* 21*  ?   ?Chemistry ?Recent Labs  ?Lab 02/07/22 ?1756 02/07/22 ?1800 02/08/22 ?0542  ?NA 139  --  139  ?K 4.1  --  3.8  ?CL 109  --  109  ?CO2 21*  --  24  ?GLUCOSE 155*  --  154*  ?BUN 19  --  20  ?CREATININE 0.60  --  0.68  ?CALCIUM 8.8*  --  8.5*  ?MG  --  1.9  --   ?PROT  --  7.5  --   ?ALBUMIN  --  3.7  --   ?AST  --  15  --   ?ALT  --  18  --   ?ALKPHOS  --  54  --   ?BILITOT  --  0.5  --   ?GFRNONAA >60  --  >60  ?ANIONGAP 9  --  6  ?  ?Lipids No results for input(s): CHOL, TRIG, HDL, LABVLDL, LDLCALC, CHOLHDL in the last 168 hours.  ?Hematology ?Recent Labs  ?Lab 02/07/22 ?1756 02/09/22 ?9833  ?WBC 9.1 6.2  ?RBC 4.42 3.77*  ?HGB 14.1 12.4  ?HCT 41.2 36.9  ?MCV 93.2 97.9  ?MCH 31.9 32.9  ?MCHC 34.2 33.6  ?RDW 13.2 13.2  ?PLT 227 177  ? ?Thyroid No results for input(s): TSH, FREET4 in the last 168 hours.  ?BNP ?Recent Labs  ?Lab 02/07/22 ?1817  ?BNP 221.0*  ?  ?DDimer No results for input(s): DDIMER in the last 168 hours.   ? ?Radiology  ?  ?DG Chest 2 View ? ?Result Date: 02/07/2022 ?CLINICAL DATA:  Chest pain EXAM: CHEST - 2 VIEW COMPARISON:  01/31/2022 chest x-ray and CT, chest x-ray 04/09/2021 FINDINGS: Stable cardiomediastinal silhouette no focal opacity, pleural effusion, or pneumothorax. Aortic atherosclerosis. IMPRESSION: No active cardiopulmonary disease. Electronically Signed   By: Donavan Foil M.D.   On: 02/07/2022 19:10  ? ?ECHOCARDIOGRAM COMPLETE ? ?Result Date: 02/08/2022 ?   ECHOCARDIOGRAM REPORT   Patient Name:   Laura [name] Date of Exam: 02/08/2022 Medical Rec #:  [id number]      Height:       64.0 in Accession #:    [id number]     Weight:       213.2 lb Date of Birth:  August 25, 1961      BSA:          2.010 m? Patient Age:    61 years       BP:           132/49 mmHg Patient Gender: F              HR:           52 bpm. Exam Location:  [facility] Procedure: 2D Echo, Cardiac Doppler, Color Doppler and Intracardiac            Opacification Agent Indications:    Syncope  History:        Patient has prior history of Echocardiogram examinations, most                 recent 12/09/2020. TIA, Arrythmias:Atrial Fibrillation,                 Signs/Symptoms:Syncope; Risk Factors:Diabetes, Former Smoker and                 Hypertension. ASD Repair, Covid 19.  Sonographer:    Leavy Cella RDCS Referring Phys: [id number] [place]  1. Left ventricular ejection fraction,  by estimation, is 60 to 65%. The left ventricle has normal function. The left ventricle has no regional wall motion abnormalities. There is mild left ventricular hypertrophy. Left ventricular diastolic parameters are indeterminate.  2. Right ventricular systolic function is normal. The right ventricular size is normal. Tricuspid regurgitation signal is inadequate for assessing PA pressure.  3. Left atrial size was mildly dilated.  4. The mitral valve is abnormal. No evidence of mitral valve regurgitation. Mild mitral stenosis.  5. The aortic valve has an  indeterminant number of cusps. There is mild calcification of the aortic valve. There is mild thickening of the aortic valve. Aortic valve regurgitation is not visualized. No aortic stenosis is present.  6. The inferior vena cava is normal in size with greater than 50% respiratory variability, suggesting right atrial pressure of 3 mmHg. FINDINGS  Left Ventricle: Left ventricular ejection fraction, by estimation, is 60 to 65%. The left ventricle has normal function. The left ventricle has no regional wall motion abnormalities. Definity contrast agent was given IV to delineate the left ventricular  endocardial borders. The left ventricular internal cavity size was normal in size. There is mild left ventricular hypertrophy. Left ventricular diastolic parameters are indeterminate. Right Ventricle: The right ventricular size is normal. Right vetricular wall thickness was not well visualized. Right ventricular systolic function is normal. Tricuspid regurgitation signal is inadequate for assessing PA pressure. Left Atrium: Left atrial size was mildly dilated. Right Atrium: Right atrial size was normal in size. Pericardium: There is no evidence of pericardial effusion. Mitral Valve: The mitral valve is abnormal. There is mild thickening of the mitral valve leaflet(s). Mild mitral annular calcification. No evidence of mitral valve regurgitation. Mild mitral valve stenosis. MV peak gradient, 14.6 mmHg. The mean mitral valve gradient is 4.0 mmHg. Tricuspid Valve: The tricuspid valve is not well visualized. Tricuspid valve regurgitation is not demonstrated. No evidence of tricuspid stenosis. Aortic Valve: The aortic valve has an indeterminant number of cusps. There is mild calcification of the aortic valve. There is mild thickening of the aortic valve. There is mild aortic valve annular calcification. Aortic valve regurgitation is not visualized. No aortic stenosis is present. Aortic valve mean gradient measures 5.0 mmHg.  Aortic valve peak gradient measures 9.4 mmHg. Aortic valve area, by VTI measures 1.77 cm?. Pulmonic Valve: The pulmonic valve was not well visualized. Pulmonic valve regurgitation is mild. No evidence of pulmonic steno

## 2022-02-09 NOTE — Telephone Encounter (Signed)
I talked with Laura [name] while in the hospital today. She reports that she is due for a Cuvitru infustion tomorrow. She has this medication and her supplies at home.

## 2022-02-10 LAB — GLUCOSE, CAPILLARY
Glucose-Capillary: 83 mg/dL (ref 70–99)
Glucose-Capillary: 204 mg/dL — ABNORMAL HIGH (ref 70–99)
Glucose-Capillary: 210 mg/dL — ABNORMAL HIGH (ref 70–99)
Glucose-Capillary: 273 mg/dL — ABNORMAL HIGH (ref 70–99)

## 2022-02-10 MED ORDER — INSULIN GLARGINE-YFGN 100 UNIT/ML ~~LOC~~ SOLN
60.0000 [IU] | Freq: Every day | SUBCUTANEOUS | Status: DC
  Administered 2022-02-10 – 2022-02-13 (×4): 60 [IU] via SUBCUTANEOUS
  Filled 2022-02-10 (×5): qty 0.6

## 2022-02-10 MED ORDER — SIMETHICONE 80 MG PO CHEW
80.0000 mg | CHEWABLE_TABLET | Freq: Once | ORAL | Status: AC
Start: 2022-02-10 — End: 2022-02-10
  Administered 2022-02-10: 80 mg via ORAL
  Filled 2022-02-10: qty 1

## 2022-02-10 MED ORDER — HYDRALAZINE HCL 25 MG PO TABS
25.0000 mg | ORAL_TABLET | Freq: Three times a day (TID) | ORAL | Status: DC
  Administered 2022-02-10 – 2022-02-11 (×3): 25 mg via ORAL
  Filled 2022-02-10 (×3): qty 1

## 2022-02-10 NOTE — Progress Notes (Signed)
? ?Progress Note ? ?Patient Name: Laura [name] ?Date of Encounter: 02/10/2022 ? ?[facility] Cardiologist: Carlyle Dolly, MD  ? ?Subjective  ? ?Some nausea yesterday ? ?Inpatient Medications  ?  ?Scheduled Meds: ? apixaban  5 mg Oral BID  ? vitamin C  500 mg Oral Daily  ? atorvastatin  10 mg Oral QPM  ? budesonide (PULMICORT) nebulizer solution  0.5 mg Nebulization BID  ? chlorhexidine  15 mL Mouth Rinse BID  ? dapagliflozin propanediol  5 mg Oral QAC breakfast  ? diltiazem  180 mg Oral Daily  ? divalproex  750 mg Oral BID  ? doxycycline  100 mg Oral BID  ? ferrous sulfate  325 mg Oral QPM  ? fluticasone furoate-vilanterol  1 puff Inhalation Daily  ? gabapentin  600 mg Oral TID  ? hydrALAZINE  25 mg Oral Q8H  ? insulin aspart  0-15 Units Subcutaneous TID WC  ? insulin aspart  3 Units Subcutaneous TID WC  ? insulin glargine-yfgn  80 Units Subcutaneous QHS  ? levalbuterol  0.63 mg Nebulization TID  ? levothyroxine  112 mcg Oral QAC breakfast  ? lidocaine  1 patch Transdermal Q24H  ? linaclotide  145 mcg Oral QAC breakfast  ? mouth rinse  15 mL Mouth Rinse q12n4p  ? melatonin  6 mg Oral QHS  ? metFORMIN  500 mg Oral BID WC  ? metoCLOPramide  5 mg Oral TID  ? montelukast  10 mg Oral Daily  ? multivitamin with minerals  1 tablet Oral Daily  ? pantoprazole  40 mg Oral Daily  ? potassium chloride SA  20 mEq Oral Daily  ? predniSONE  40 mg Oral Q breakfast  ? sodium chloride flush  3 mL Intravenous Q12H  ? topiramate  100 mg Oral BID  ? vitamin B-12  1,000 mcg Oral Daily  ? ?Continuous Infusions: ? ?PRN Meds: ?acetaminophen, guaiFENesin-dextromethorphan, HYDROcodone bit-homatropine, HYDROcodone-acetaminophen, hydrOXYzine, ipratropium, levalbuterol, morphine injection, ondansetron **OR** ondansetron (ZOFRAN) IV, polyethylene glycol  ? ?Vital Signs  ?  ?Vitals:  ? 02/10/22 0828 02/10/22 0830 02/10/22 0833 02/10/22 1336  ?BP:    (!) 108/49  ?Pulse:    (!) 55  ?Resp:    20  ?Temp:    98.3 ?F (36.8 ?C)  ?TempSrc:       ?SpO2: 98% 100% 100% 99%  ?Weight:      ?Height:      ? ? ?Intake/Output Summary (Last 24 hours) at 02/10/2022 1337 ?Last data filed at 02/10/2022 1314 ?Gross per 24 hour  ?Intake 1470 ml  ?Output 3100 ml  ?Net -1630 ml  ? ? ?  02/10/2022  ?  5:56 AM 02/09/2022  ?  5:28 AM 02/08/2022  ?  5:00 AM  ?Last 3 Weights  ?Weight (lbs) 218 lb 11.1 oz 207 lb 10.8 oz 213 lb 3 oz  ?Weight (kg) 99.2 kg 94.2 kg 96.7 kg  ?   ? ?Telemetry  ?  ?Afib in 60s - Personally Reviewed ? ?ECG  ?  ?N/a - Personally Reviewed ? ?Physical Exam  ? ?GEN: No acute distress.   ?Neck: No JVD ?Cardiac: irreg ?Respiratory: Clear to auscultation bilaterally. ?GI: Soft, nontender, non-distended  ?MS: No edema; No deformity. ?Neuro:  Nonfocal  ?Psych: Normal affect  ? ?Labs  ?  ?High Sensitivity Troponin:   ?Recent Labs  ?Lab 02/07/22 ?1756 02/07/22 ?1944  ?TROPONINIHS 20* 21*  ?   ?Chemistry ?Recent Labs  ?Lab 02/07/22 ?1756 02/07/22 ?1800 02/08/22 ?0542  ?NA 139  --  139  ?K 4.1  --  3.8  ?CL 109  --  109  ?CO2 21*  --  24  ?GLUCOSE 155*  --  154*  ?BUN 19  --  20  ?CREATININE 0.60  --  0.68  ?CALCIUM 8.8*  --  8.5*  ?MG  --  1.9  --   ?PROT  --  7.5  --   ?ALBUMIN  --  3.7  --   ?AST  --  15  --   ?ALT  --  18  --   ?ALKPHOS  --  6  --   ?BILITOT  --  0.5  --   ?GFRNONAA >60  --  >60  ?ANIONGAP 9  --  6  ?  ?Lipids No results for input(s): CHOL, TRIG, HDL, LABVLDL, LDLCALC, CHOLHDL in the last 168 hours.  ?Hematology ?Recent Labs  ?Lab 02/07/22 ?1756 02/09/22 ?3710  ?WBC 9.1 6.2  ?RBC 4.42 3.77*  ?HGB 14.1 12.4  ?HCT 41.2 36.9  ?MCV 93.2 97.9  ?MCH 31.9 32.9  ?MCHC 34.2 33.6  ?RDW 13.2 13.2  ?PLT 227 177  ? ?Thyroid No results for input(s): TSH, FREET4 in the last 168 hours.  ?BNP ?Recent Labs  ?Lab 02/07/22 ?1817  ?BNP 221.0*  ?  ?DDimer No results for input(s): DDIMER in the last 168 hours.  ? ?Radiology  ?  ?No results found. ? ?Cardiac Studies  ? ? ? ?Patient Profile  ?  ?Terrisha Lopata is a 61 y.o. female with a hx of chest pain (catheterization in  08/2019 showing no evidence of CAD), HFpEF, prior ASD repair, permanent atrial fibrillation, HTN, OSA and asthma who is being seen 02/08/2022 for the evaluation of syncope at the request of Dr. Antonieta Pert. ? ?Assessment & Plan  ?  ?1.Syncope ?- prior episodes were clearly orthostatic. At prior clinic visit near syncope during orthostatic testing, had to call EMS as could not get off exam table without symptoms ?- we have since been weaning bp meds and diuretic. She is on depakote which has 1-5% assoc with orthostatic hypotension.  ?- orthostatic symptoms improved ?  ?- this episode of syncope very different from prior. Was not positional, no prodrome. Concerning for cardiogenic syncope. Recent monitor with nocturnal pauses only around 3 seconds in setting of afib, had not had day time pauses. Tele here shows afib slow ventricular response ?- have lowered her av nodal agents, now on dilt long acting '180mg'$  and off bisoprolol. Tele afibs 60s.  ?  ?  ?  ?2. Asthma exacerbation ?- per primary team ?  ?3. Afib ?-slow rates here on admission, nocutnral pauses around 3 seconds but no daytime ?- bisprolol stopped, diltiazem lowered from '360mg'$  to '180mg'$ . Rates 60s now ?- remains on eliquis ?  ?  ?4. Chronic atypical chest pain ?- cath 2020 no disease ?- no significant objective evidence of ischemia this admit ?- echo without significant changs ?- no further testing planned from chest pain standpoint.  ?  ?5. Chronic diastolic HF ?- torsemide changed to just prn, even with change has not had sigifnicant fluid building up. She did have some weight gain at time of intial change likely appropriate for rehydation.  ?  ?6. HTN ?- have accepted higher bp's for her given prior issues with orthostatic syncope ? ? ?Doing well cardiac wise, would just check telemetry tomorrow on rounds. If rates look good would be ok for discharge from cardiac standpoint.  ? ? ? ? ?For  questions or updates, please contact [place] ?Please consult  [email or web address] for contact info under  ? ?  ?   ?Signed, ?Carlyle Dolly, MD  ?02/10/2022, 1:37 PM   ? ?

## 2022-02-10 NOTE — Progress Notes (Signed)
Inpatient Diabetes Program Recommendations ? ?AACE/ADA: New Consensus Statement on Inpatient Glycemic Control (2015) ? ?Target Ranges:  Prepandial:   less than 140 mg/dL ?     Peak postprandial:   less than 180 mg/dL (1-2 hours) ?     Critically ill patients:  140 - 180 mg/dL  ? ?Lab Results  ?Component Value Date  ? GLUCAP 83 02/10/2022  ? HGBA1C 7.5 11/18/2021  ? ? ?Review of Glycemic Control ? Latest Reference Range & Units 02/09/22 07:20 02/09/22 11:29 02/09/22 16:17 02/09/22 21:58 02/10/22 07:35  ?Glucose-Capillary 70 - 99 mg/dL 206 (H) 146 (H) 253 (H) 251 (H) 83  ?(H): Data is abnormally high ? ?Diabetes history: DM2 ?Outpatient Diabetes medications:  Lantus 80 QHS, Metformin 062 BID, Trulicity 1.5 mg Qweek ?Current orders for Inpatient glycemic control: Metformin 500 BID, Semglee 80 QHS, 3 TID MC, 0-15 TID, Farxiga 5 QAM; Prednisone 40 QAM ? ?Inpatient Diabetes Program Recommendations:   ?Fasting CBG 83. ?-Decrease Semglee to 70 units qhs ?Secure chat sent to Dr. Bonner Puna. ? ?Thank you, ?Nani Gasser Rusell Meneely, RN, MSN, CDE  ?Diabetes Coordinator ?Inpatient Glycemic Control Team ?Team Pager [phone] (8am-5pm) ?02/10/2022 9:10 AM ? ? ? ?

## 2022-02-10 NOTE — Care Management Important Message (Signed)
Important Message ? ?Patient Details  ?Name: Laura [name] ?MRN: [id number] ?Date of Birth: 08-Mar-1961 ? ? ?Medicare Important Message Given:  N/A - LOS <3 / Initial given by admissions ? ? ? ? ?Tommy Medal ?02/10/2022, 10:52 AM ?

## 2022-02-10 NOTE — Plan of Care (Signed)
?  Problem: Acute Rehab PT Goals(only PT should resolve) ?Goal: Pt Will Go Supine/Side To Sit ?Outcome: Progressing ?Flowsheets (Taken 02/10/2022 1559) ?Pt will go Supine/Side to Sit: Independently ?Goal: Patient Will Perform Sitting Balance ?Outcome: Progressing ?Flowsheets (Taken 02/10/2022 1559) ?Patient will perform sitting balance: Independently ?Goal: Patient Will Transfer Sit To/From Stand ?Outcome: Progressing ?Flowsheets (Taken 02/10/2022 1559) ?Patient will transfer sit to/from stand: ? with modified independence ? with supervision ?Goal: Pt Will Transfer Bed To Chair/Chair To Bed ?Outcome: Progressing ?Flowsheets (Taken 02/10/2022 1559) ?Pt will Transfer Bed to Chair/Chair to Bed: ? with modified independence ? with supervision ?Goal: Pt Will Perform Standing Balance Or Pre-Gait ?Outcome: Progressing ?Flowsheets (Taken 02/10/2022 1559) ?Pt will perform standing balance or pre-gait: ? with Modified Independent ? with Supervision ?Goal: Pt Will Ambulate ?Outcome: Progressing ?Flowsheets (Taken 02/10/2022 1559) ?Pt will Ambulate: ? 75 feet ? with supervision ? with modified independence ? with rolling walker ? ?4:00 PM, 02/10/22 ?Lestine Box, S/PT  ?  ?
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
## 2022-02-10 NOTE — Evaluation (Signed)
Physical Therapy Evaluation ?Patient Details ?Name: Laura [name] ?MRN: [id number] ?DOB: 09/30/1961 ?Today's Date: 02/10/2022 ? ?History of Present Illness ? Laura [name] is a 61 y.o. female with medical history significant of chronic A-fib, mild intermittent asthma, chronic diastolic CHF, bipolar disorder, chronic peripheral neuropathy, OSA on CPAP, congenital ASD s/p repair, presented with syncope ?  ?Clinical Impression ? Patient demonstrates independence with bed mobility with no increase in sx. Patient is able to complete sit to stand, and bed to chair/BSC transfers with slow labored movement but demonstrating good return to participating in ADL's and improving functional status. Patient is able to ambulate independently with RW and close supervision with sPO2 monitored around 98% during gait.Fatigue and generalized LE weakness were the primarily limiting factors during gait assessment. Patient will benefit from continued skilled physical therapy in hospital and recommended venue below to increase strength, balance, endurance for safe ADLs and gait.  ?   ? ?Recommendations for follow up therapy are one component of a multi-disciplinary discharge planning process, led by the attending physician.  Recommendations may be updated based on patient status, additional functional criteria and insurance authorization. ? ?Follow Up Recommendations Home health PT ? ?  ?Assistance Recommended at Discharge PRN  ?Patient can return home with the following ? A little help with walking and/or transfers;A little help with bathing/dressing/bathroom;Assist for transportation;Help with stairs or ramp for entrance ? ?  ?Equipment Recommendations    ?Recommendations for Other Services ?    ?  ?Functional Status Assessment Patient has had a recent decline in their functional status and demonstrates the ability to make significant improvements in function in a reasonable and predictable amount of time.  ? ?  ?Precautions / Restrictions  Precautions ?Precautions: Fall ?Precaution Comments: Patient experiences dizziness when standing and changing positions ?Restrictions ?Weight Bearing Restrictions: No  ? ?  ? ?Mobility ? Bed Mobility ?Overal bed mobility: Independent ?  ?  ?  ?  ?  ?  ?General bed mobility comments: Patient was independent with supine to sit and sit to supine but did so with labored movement and increased time needed to complete task. ?  ? ?Transfers ?Overall transfer level: Modified independent ?Equipment used: Rolling walker (2 wheels) ?  ?  ?  ?  ?  ?  ?  ?General transfer comment: Patient able to complete sit to stand and bed to chair/BSC transfers independently. Patient does require assistance of rolling walker along with demonstrating slow labored movement when completing transfers. ?  ? ?Ambulation/Gait ?Ambulation/Gait assistance: Modified independent (Device/Increase time), Supervision ?Gait Distance (Feet): 45 Feet ?Assistive device: Rolling walker (2 wheels) ?Gait Pattern/deviations: Step-through pattern, Decreased step length - right, Decreased step length - left, Decreased stride length, Trunk flexed ?Gait velocity: decreased ?  ?  ?General Gait Details: Patient demonstrates ability to ambulate with RW independently with supervision required for safety. Patient is mainly limited by fatigue and minor chest pain causing need for frequent breaks and minor balance deficits ? ?Stairs ?  ?  ?  ?  ?  ? ?Wheelchair Mobility ?  ? ?Modified Rankin (Stroke Patients Only) ?  ? ?  ? ?Balance Overall balance assessment: Needs assistance ?Sitting-balance support: Feet supported ?Sitting balance-Leahy Scale: Good ?Sitting balance - Comments: Patient demonstrates good trunk control and balance sitting EOB with feet supported. ?  ?Standing balance support: Bilateral upper extremity supported, During functional activity, Reliant on assistive device for balance ?Standing balance-Leahy Scale: Fair ?Standing balance comment: Patient  demonstrates fair balance with use  of RW in standing in which general LE muscular weakness and fatigue impact patient's balancing capabilities. ?  ?  ?  ?  ?  ?  ?  ?  ?  ?  ?  ?   ? ? ? ?Pertinent Vitals/Pain Pain Assessment ?Pain Assessment: 0-10 ?Pain Score: 7  ?Pain Location: head, chest, lungs, hands, feet ?Pain Descriptors / Indicators: Aching, Discomfort ?Pain Intervention(s): Limited activity within patient's tolerance, Monitored during session, Repositioned  ? ? ?Home Living Family/patient expects to be discharged to:: Private residence ?Living Arrangements: Other relatives ?Available Help at Discharge: Family ?Type of Home: Mobile home ?Home Access: Stairs to enter ?Entrance Stairs-Rails: None ?Entrance Stairs-Number of Steps: 2-4 ?  ?Home Layout: One level ?Home Equipment: Rollator (4 wheels) ?   ?  ?Prior Function Prior Level of Function : Independent/Modified Independent ?  ?  ?  ?  ?  ?  ?Mobility Comments: Patient was able to ambulate at home and in the community independently previously. Patient was driving before recent hospital visit. ?ADLs Comments: Patient reports being able to complete all ADL's and functional tasks independently previously. ?  ? ? ?Hand Dominance  ?   ? ?  ?Extremity/Trunk Assessment  ? Upper Extremity Assessment ?Upper Extremity Assessment: Overall WFL for tasks assessed ?  ? ?Lower Extremity Assessment ?Lower Extremity Assessment: Generalized weakness ?  ? ?Cervical / Trunk Assessment ?Cervical / Trunk Assessment: Normal  ?Communication  ? Communication: No difficulties  ?Cognition Arousal/Alertness: Awake/alert ?Behavior During Therapy: [facility] for tasks assessed/performed ?Overall Cognitive Status: Within Functional Limits for tasks assessed ?  ?  ?  ?  ?  ?  ?  ?  ?  ?  ?  ?  ?  ?  ?  ?  ?  ?  ?  ? ?  ?General Comments   ? ?  ?Exercises    ? ?Assessment/Plan  ?  ?PT Assessment Patient needs continued PT services;All further PT needs can be met in the next venue of care  ?PT  Problem List Decreased strength;Decreased activity tolerance;Decreased balance;Decreased mobility;Decreased coordination ? ?   ?  ?PT Treatment Interventions DME instruction;Gait training;Functional mobility training;Therapeutic activities;Therapeutic exercise;Balance training   ? ?PT Goals (Current goals can be found in the Care Plan section)  ?Acute Rehab PT Goals ?Patient Stated Goal: return home ?PT Goal Formulation: With patient ?Time For Goal Achievement: 02/24/22 ?Potential to Achieve Goals: Good ? ?  ?Frequency Min 3X/week ?  ? ? ?Co-evaluation   ?  ?  ?  ?  ? ? ?  ?AM-PAC PT "6 Clicks" Mobility  ?Outcome Measure Help needed turning from your back to your side while in a flat bed without using bedrails?: None ?Help needed moving from lying on your back to sitting on the side of a flat bed without using bedrails?: None ?Help needed moving to and from a bed to a chair (including a wheelchair)?: A Little ?Help needed standing up from a chair using your arms (e.g., wheelchair or bedside chair)?: A Little ?Help needed to walk in hospital room?: A Little ?Help needed climbing 3-5 steps with a railing? : A Lot ?6 Click Score: 19 ? ?  ?End of Session   ?Activity Tolerance: Patient tolerated treatment well;No increased pain;Patient limited by fatigue ?Patient left: in bed;with call bell/phone within reach ?Nurse Communication: Mobility status ?PT Visit Diagnosis: Other abnormalities of gait and mobility (R26.89);Unsteadiness on feet (R26.81);Muscle weakness (generalized) (M62.81);Pain ?  ? ?Time: 1683-7290 ?PT Time Calculation (  min) (ACUTE ONLY): 35 min ? ? ?Charges:   PT Evaluation ?$PT Eval Moderate Complexity: 1 Mod ?PT Treatments ?$Therapeutic Activity: 23-37 mins ?  ?   ? ? ?3:59 PM, 02/10/22 ?Lestine Box, S/PT  ? ?

## 2022-02-11 ENCOUNTER — Inpatient Hospital Stay (HOSPITAL_COMMUNITY): Payer: Medicare Other

## 2022-02-11 ENCOUNTER — Telehealth: Payer: Self-pay | Admitting: *Deleted

## 2022-02-11 ENCOUNTER — Telehealth: Payer: Self-pay | Admitting: Family Medicine

## 2022-02-11 ENCOUNTER — Ambulatory Visit: Payer: Medicare Other | Admitting: "Endocrinology

## 2022-02-11 DIAGNOSIS — I4819 Other persistent atrial fibrillation: Secondary | ICD-10-CM

## 2022-02-11 DIAGNOSIS — I951 Orthostatic hypotension: Secondary | ICD-10-CM

## 2022-02-11 LAB — BASIC METABOLIC PANEL
Anion gap: 8 (ref 5–15)
BUN: 22 mg/dL — ABNORMAL HIGH (ref 6–20)
CO2: 26 mmol/L (ref 22–32)
Calcium: 8.6 mg/dL — ABNORMAL LOW (ref 8.9–10.3)
Chloride: 106 mmol/L (ref 98–111)
Creatinine, Ser: 0.6 mg/dL (ref 0.44–1.00)
GFR, Estimated: >60 mL/min (ref >60)
Glucose, Bld: 133 mg/dL — ABNORMAL HIGH (ref 70–99)
Potassium: 3.8 mmol/L (ref 3.5–5.1)
Sodium: 140 mmol/L (ref 135–145)

## 2022-02-11 LAB — GLUCOSE, CAPILLARY
Glucose-Capillary: 101 mg/dL — ABNORMAL HIGH (ref 70–99)
Glucose-Capillary: 102 mg/dL — ABNORMAL HIGH (ref 70–99)
Glucose-Capillary: 198 mg/dL — ABNORMAL HIGH (ref 70–99)

## 2022-02-11 LAB — MAGNESIUM: Magnesium: 2 mg/dL (ref 1.7–2.4)

## 2022-02-11 LAB — BRAIN NATRIURETIC PEPTIDE: B Natriuretic Peptide: 144 pg/mL — ABNORMAL HIGH (ref 0.0–100.0)

## 2022-02-11 IMAGING — DX DG CHEST 2V
2 series · 2 of 2 positions shown · non-contrast
Comparison: [DATE].

CLINICAL DATA: Atrial fibrillation, congestive heart failure.

EXAM:
CHEST - 2 VIEW

[chest pa]
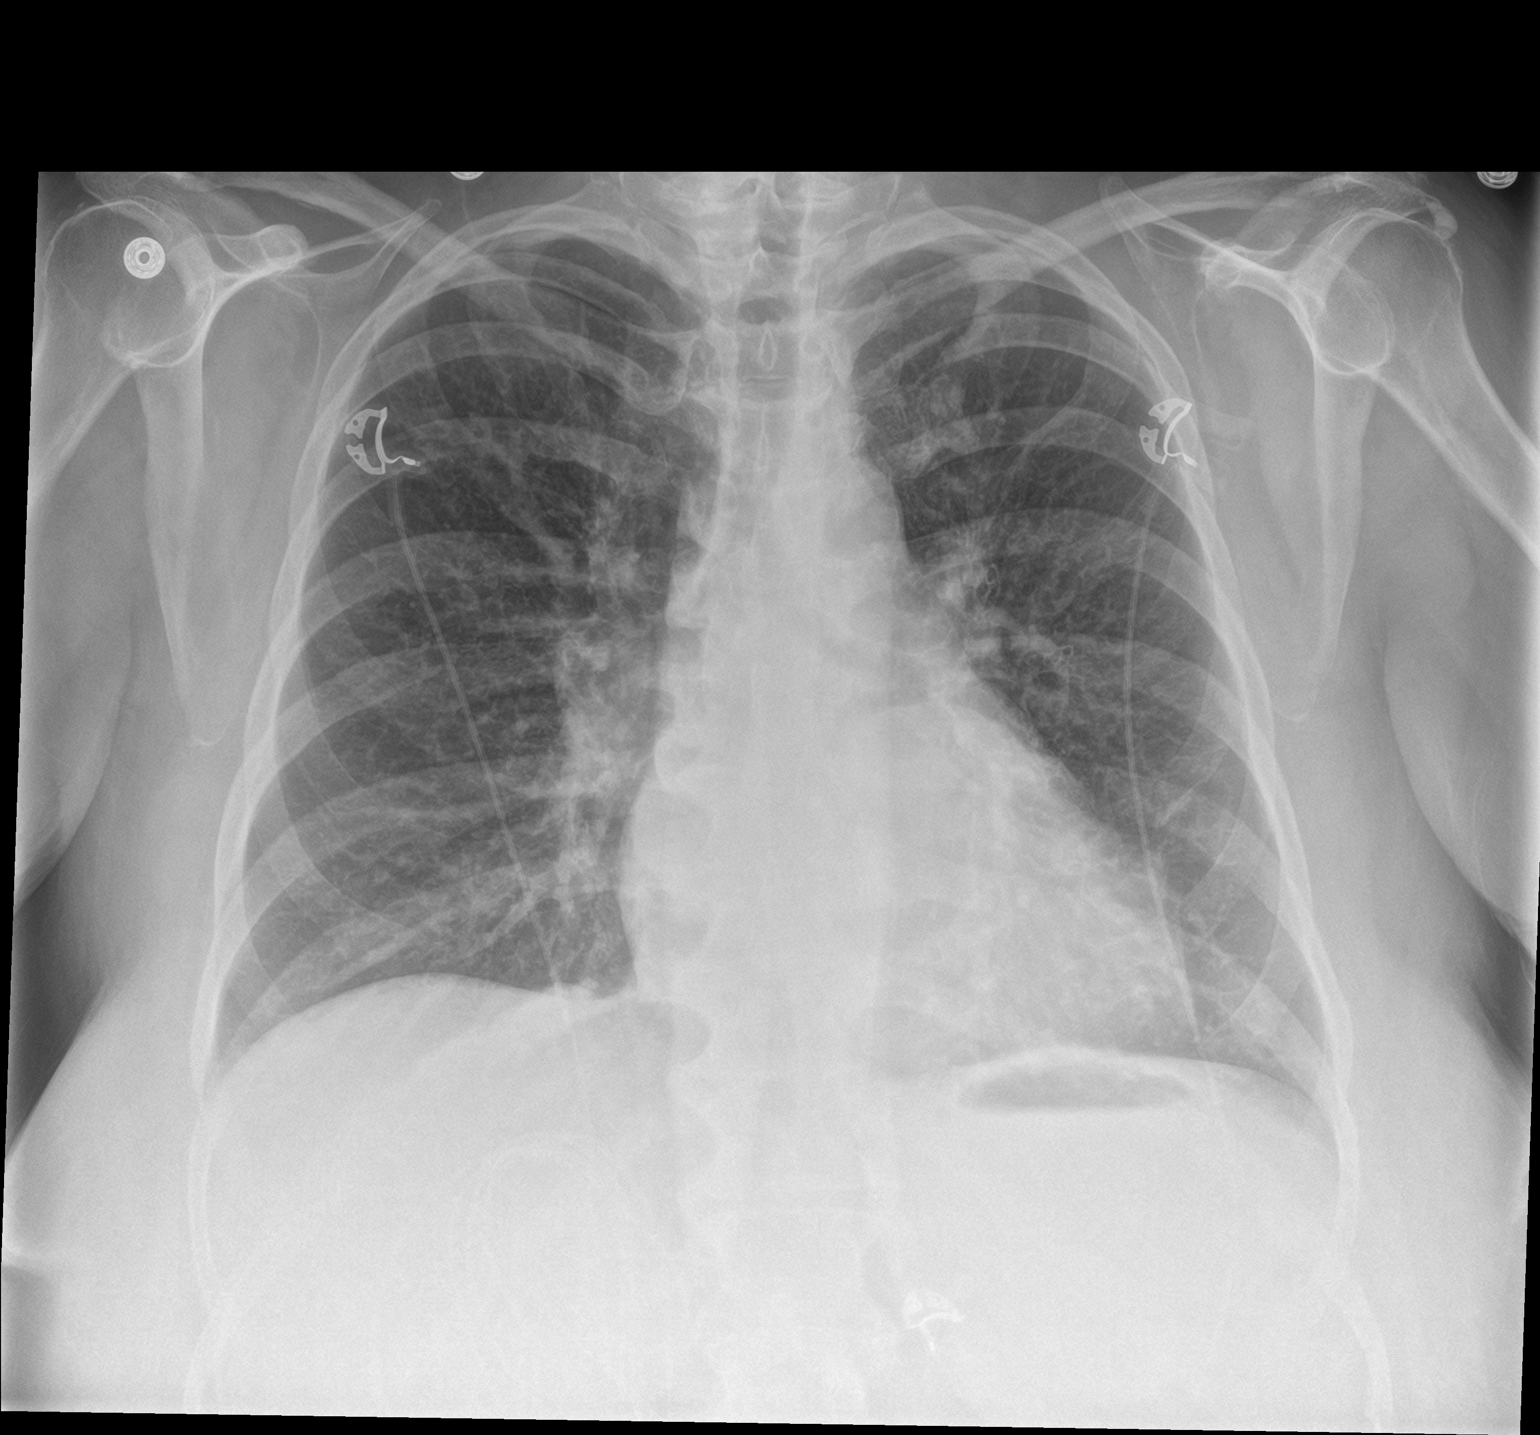

[chest lat]
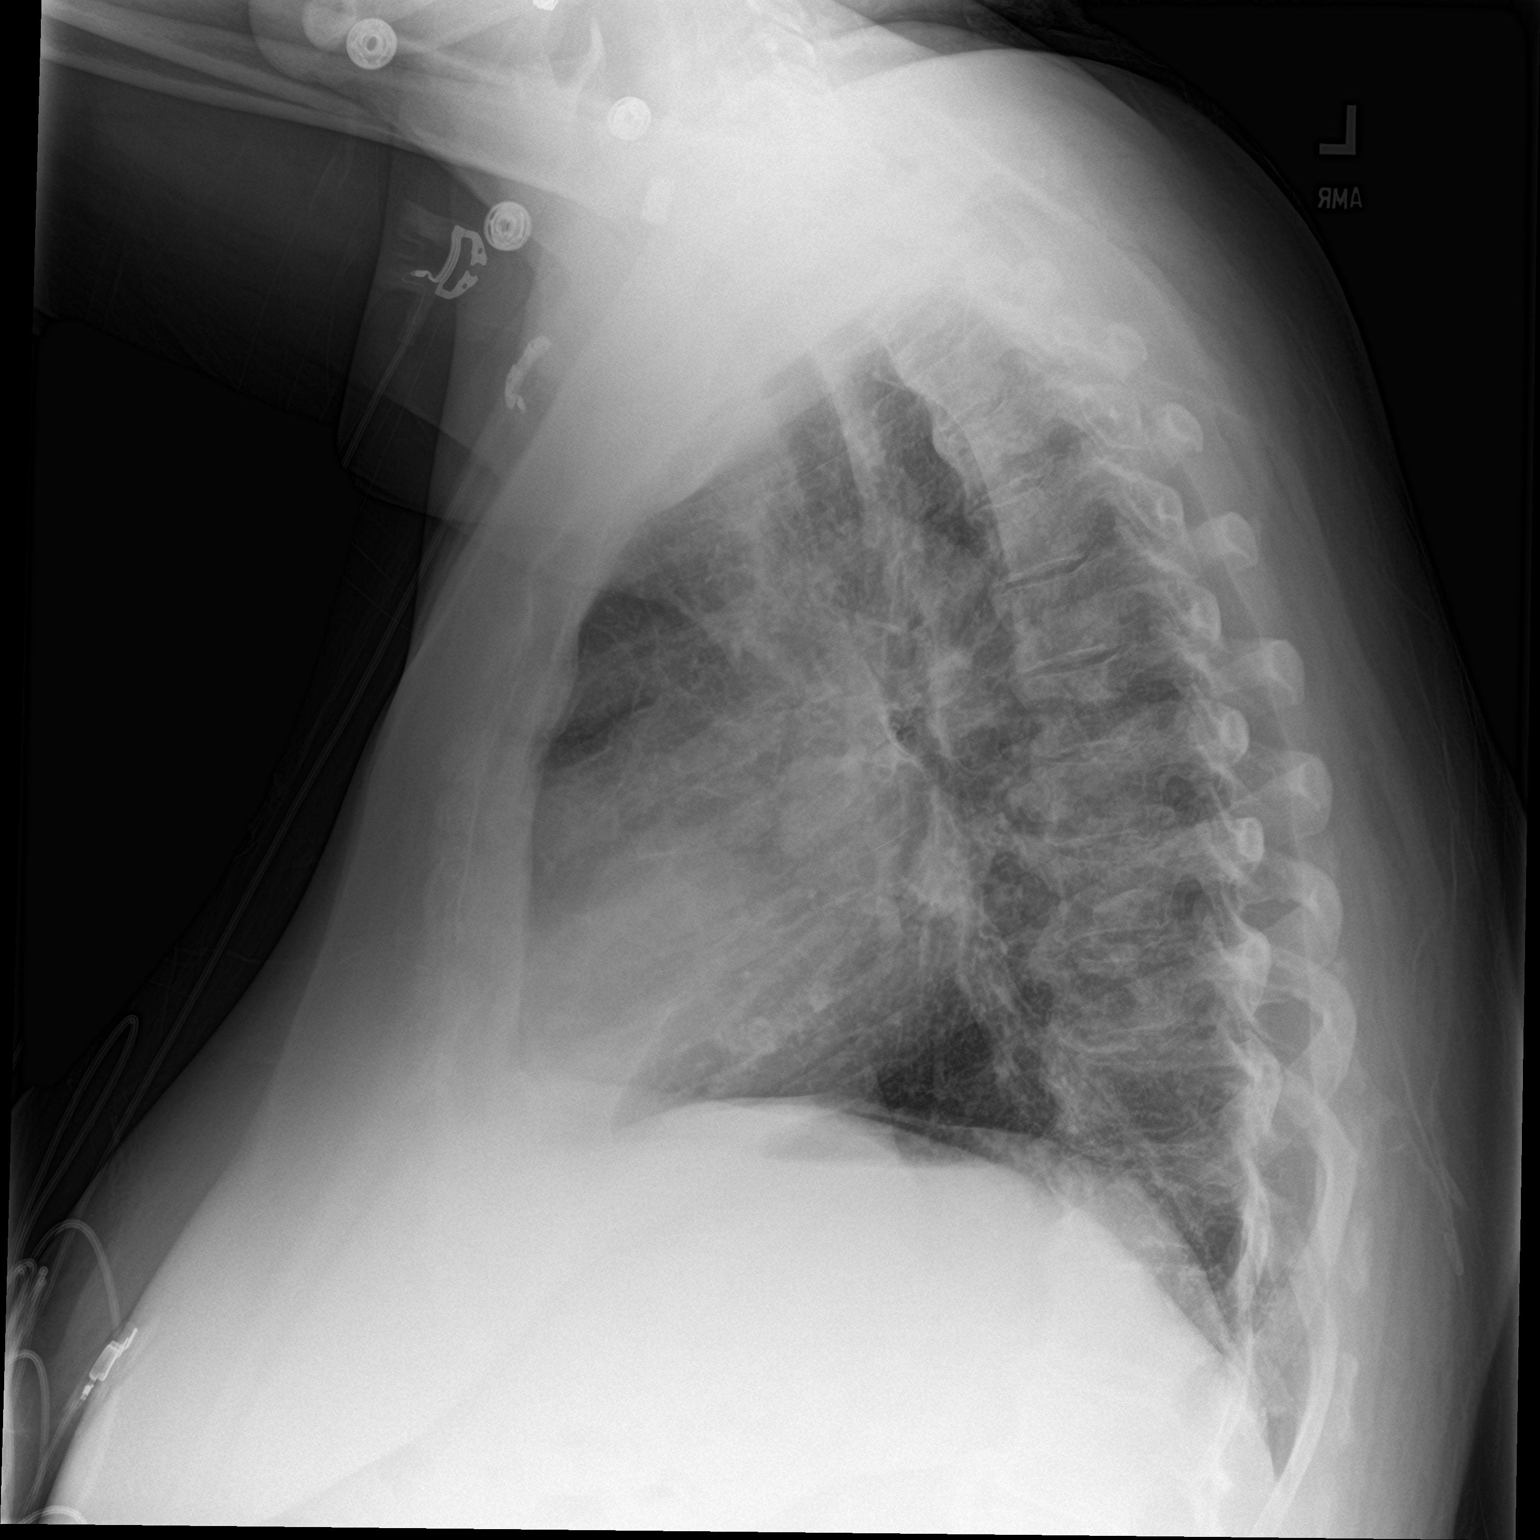

[2 of 2 positions shown; findings below may reference images not displayed]

FINDINGS: The heart size and mediastinal contours are within normal limits.
Both lungs are clear. The visualized skeletal structures are
unremarkable.
IMPRESSION: No active cardiopulmonary disease.

## 2022-02-11 MED ORDER — LOSARTAN POTASSIUM 50 MG PO TABS
25.0000 mg | ORAL_TABLET | Freq: Every day | ORAL | Status: DC
  Administered 2022-02-11 – 2022-02-14 (×4): 25 mg via ORAL
  Filled 2022-02-11 (×4): qty 1

## 2022-02-11 MED ORDER — LEVALBUTEROL HCL 0.63 MG/3ML IN NEBU
0.6300 mg | INHALATION_SOLUTION | Freq: Two times a day (BID) | RESPIRATORY_TRACT | Status: DC
  Administered 2022-02-11 – 2022-02-14 (×6): 0.63 mg via RESPIRATORY_TRACT
  Filled 2022-02-11 (×6): qty 3

## 2022-02-11 MED ORDER — LINACLOTIDE 145 MCG PO CAPS: 145.0000 ug | ORAL_CAPSULE | Freq: Every day | ORAL | 1 refills | Status: DC

## 2022-02-11 NOTE — Progress Notes (Addendum)
? ?Progress Note ? ?Patient Name: Laura [name] ?Date of Encounter: 02/11/2022 ? ?[facility] Cardiologist: Carlyle Dolly, MD  ? ?Subjective  ?Still having some chest pain with coughing.  She still feels short of breath from her bronchitis.  She is concerned because her weight is up and normally is around 200 pounds at home.  Her weight on admission was 209 pounds and is now up to 218 pounds. ? ?Inpatient Medications  ?  ?Scheduled Meds: ? apixaban  5 mg Oral BID  ? vitamin C  500 mg Oral Daily  ? atorvastatin  10 mg Oral QPM  ? budesonide (PULMICORT) nebulizer solution  0.5 mg Nebulization BID  ? chlorhexidine  15 mL Mouth Rinse BID  ? dapagliflozin propanediol  5 mg Oral QAC breakfast  ? diltiazem  180 mg Oral Daily  ? divalproex  750 mg Oral BID  ? doxycycline  100 mg Oral BID  ? ferrous sulfate  325 mg Oral QPM  ? fluticasone furoate-vilanterol  1 puff Inhalation Daily  ? gabapentin  600 mg Oral TID  ? hydrALAZINE  25 mg Oral Q8H  ? insulin aspart  0-15 Units Subcutaneous TID WC  ? insulin aspart  3 Units Subcutaneous TID WC  ? insulin glargine-yfgn  60 Units Subcutaneous QHS  ? levalbuterol  0.63 mg Nebulization TID  ? levothyroxine  112 mcg Oral QAC breakfast  ? lidocaine  1 patch Transdermal Q24H  ? linaclotide  145 mcg Oral QAC breakfast  ? mouth rinse  15 mL Mouth Rinse q12n4p  ? melatonin  6 mg Oral QHS  ? metFORMIN  500 mg Oral BID WC  ? metoCLOPramide  5 mg Oral TID  ? montelukast  10 mg Oral Daily  ? multivitamin with minerals  1 tablet Oral Daily  ? pantoprazole  40 mg Oral Daily  ? potassium chloride SA  20 mEq Oral Daily  ? predniSONE  40 mg Oral Q breakfast  ? sodium chloride flush  3 mL Intravenous Q12H  ? topiramate  100 mg Oral BID  ? vitamin B-12  1,000 mcg Oral Daily  ? ?Continuous Infusions: ? ?PRN Meds: ?acetaminophen, guaiFENesin-dextromethorphan, HYDROcodone bit-homatropine, HYDROcodone-acetaminophen, hydrOXYzine, ipratropium, levalbuterol, morphine injection, ondansetron **OR**  ondansetron (ZOFRAN) IV, polyethylene glycol  ? ?Vital Signs  ?  ?Vitals:  ? 02/11/22 0325 02/11/22 0330 02/11/22 1751 02/11/22 0816  ?BP: (!) 169/95 (!) 170/75    ?Pulse: (!) 58 65 87   ?Resp: '17 19 18   '$ ?Temp: 97.9 ?F (36.6 ?C) 97.9 ?F (36.6 ?C)    ?TempSrc:      ?SpO2: 100% 99% 98% 98%  ?Weight:      ?Height:      ? ? ?Intake/Output Summary (Last 24 hours) at 02/11/2022 0837 ?Last data filed at 02/11/2022 0700 ?Gross per 24 hour  ?Intake 880 ml  ?Output 1700 ml  ?Net -820 ml  ? ? ? ?  02/11/2022  ?  3:00 AM 02/10/2022  ?  5:56 AM 02/09/2022  ?  5:28 AM  ?Last 3 Weights  ?Weight (lbs) 218 lb 4.1 oz 218 lb 11.1 oz 207 lb 10.8 oz  ?Weight (kg) 99 kg 99.2 kg 94.2 kg  ?   ? ?Telemetry  ?  ?Atrial fibrillation with controlled ventricular response- Personally Reviewed ? ?ECG  ?  ?N/a - Personally Reviewed ? ?Physical Exam  ? ?GEN: Well nourished, well developed in no acute distress ?HEENT: Normal ?NECK: No JVD; No carotid bruits ?LYMPHATICS: No lymphadenopathy ?CARDIAC: Irregularly  irregular, no murmurs, rubs, gallops ?RESPIRATORY:  Clear to auscultation without rales, wheezing or rhonchi  ?ABDOMEN: Soft, non-tender, non-distended ?MUSCULOSKELETAL:  No edema; No deformity  ?SKIN: Warm and dry ?NEUROLOGIC:  Alert and oriented x 3 ?PSYCHIATRIC:  Normal affect   ?Labs  ?  ?High Sensitivity Troponin:   ?Recent Labs  ?Lab 02/07/22 ?1756 02/07/22 ?1944  ?TROPONINIHS 20* 21*  ? ?   ?Chemistry ?Recent Labs  ?Lab 02/07/22 ?1756 02/07/22 ?1800 02/08/22 ?3785 02/11/22 ?8850  ?NA 139  --  139 140  ?K 4.1  --  3.8 3.8  ?CL 109  --  109 106  ?CO2 21*  --  24 26  ?GLUCOSE 155*  --  154* 133*  ?BUN 19  --  20 22*  ?CREATININE 0.60  --  0.68 0.60  ?CALCIUM 8.8*  --  8.5* 8.6*  ?MG  --  1.9  --  2.0  ?PROT  --  7.5  --   --   ?ALBUMIN  --  3.7  --   --   ?AST  --  15  --   --   ?ALT  --  18  --   --   ?ALKPHOS  --  70  --   --   ?BILITOT  --  0.5  --   --   ?GFRNONAA >60  --  >60 >60  ?ANIONGAP 9  --  6 8  ? ?  ?Lipids No results for input(s):  CHOL, TRIG, HDL, LABVLDL, LDLCALC, CHOLHDL in the last 168 hours.  ?Hematology ?Recent Labs  ?Lab 02/07/22 ?1756 02/09/22 ?2774  ?WBC 9.1 6.2  ?RBC 4.42 3.77*  ?HGB 14.1 12.4  ?HCT 41.2 36.9  ?MCV 93.2 97.9  ?MCH 31.9 32.9  ?MCHC 34.2 33.6  ?RDW 13.2 13.2  ?PLT 227 177  ? ? ?Thyroid No results for input(s): TSH, FREET4 in the last 168 hours.  ?BNP ?Recent Labs  ?Lab 02/07/22 ?1817  ?BNP 221.0*  ? ?  ?DDimer No results for input(s): DDIMER in the last 168 hours.  ? ?Radiology  ?  ?No results found. ? ?Cardiac Studies  ? ? ? ?Patient Profile  ?  ?Mariadelosang Wynns is a 61 y.o. female with a hx of chest pain (catheterization in 08/2019 showing no evidence of CAD), HFpEF, prior ASD repair, permanent atrial fibrillation, HTN, OSA and asthma who is being seen 02/08/2022 for the evaluation of syncope at the request of Dr. Antonieta Pert. ? ?Assessment & Plan  ?  ?1.Syncope ?- prior episodes were clearly orthostatic. At prior clinic visit near syncope during orthostatic testing, had to call EMS as could not get off exam table without symptoms ?- we have since been weaning bp meds and diuretic. She is on depakote which has 1-5% assoc with orthostatic hypotension.  ?- orthostatic symptoms improved ?- this episode of syncope very different from prior. Was not positional, no prodrome. Concerning for cardiogenic syncope. Recent monitor with nocturnal pauses only around 3 seconds in setting of afib, had not had day time pauses. Tele here shows afib slow ventricular response ?- have lowered her av nodal agents, now on dilt long acting '180mg'$  and off bisoprolol.  Heart rate has been 55 to 88 bpm ?- check orthostatic blood pressures today ?- Place thigh-high compression hose and abdominal binder ?- She is on an alpha-blocker hydralazine for blood pressure control which can potentiate orthostatic hypotension ?- Recommend changing hydralazine to  losartan 25 mg daily and titrate as needed for blood  pressure control ?- May need to consider  addition of Mestinon for orthostatic hypotension if she remains orthostatic on vital signs ?- Cannot use Florinef or midodrine for her orthostatic hypotension given her labile hypertension with tendency towards elevated blood pressures at times as well low pressures ?- Recommend stopping farxiga which should not be used in patients with tendencies towards hypotension ?  ?2. Asthma exacerbation ?- per primary team ?  ?3. Afib ?- slow rates here on admission, nocutnral pauses around 3 seconds but no daytime ?- bisprolol stopped, diltiazem lowered from '360mg'$  to '180mg'$ . Rates now 25s to 37s. ?- Continue Eliquis 5 mg twice daily ?  ?4. Chronic atypical chest pain ?- cath 2020 no disease ?- no significant objective evidence of ischemia this admit ?- echo without significant changs ?- no further testing planned from chest pain standpoint.  ?-Likely related to her acute bronchitis ?  ?5. Chronic diastolic HF ?- torsemide changed to just prn, even with change has not had sigifnicant fluid building up. She did have some weight gain at time of intial change likely appropriate for rehydation.  ?-She is concerned that her weight is increased but she does not have any lower extremity edema on exam ?-check BNP and chest x-ray today ?  ?6. HTN ?- have accepted higher bp's for her given prior issues with orthostatic syncope ?- BP this morning around 3 AM was 170/75 mmHg but yesterday afternoon was 108/49 mmHg ?- Stop hydralazine which is an alpha-blocker and can potentiate orthostatic hypotension and start losartan 25 mg daily which can be titrated as needed for blood pressure control ? ?I think she should stay over the weekend to make sure she stable on the medication changes. ? ?I have spent a total of 30 minutes with patient reviewing 2D echo , telemetry, EKGs, labs and examining patient as well as establishing an assessment and plan that was discussed with the patient.  > 50% of time was spent in direct patient care.    ? ? ?For  questions or updates, please contact [place] ?Please consult [email or web address] for contact info under  ? ?  ?   ?Signed, ?Fransico Him, MD  ?02/11/2022, 8:37 AM   ? ?

## 2022-02-11 NOTE — Addendum Note (Signed)
Addended by: Mahala Menghini on: 02/11/2022 02:28 PM ? ? Modules accepted: Orders ? ?
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
## 2022-02-11 NOTE — Progress Notes (Signed)
Physical Therapy Treatment ?Patient Details ?Name: Laura [name] ?MRN: [id number] ?DOB: [birth year] ?Today's Date: 02/11/2022 ? ? ?History of Present Illness Brandee Markin is a 61 y.o. female with medical history significant of chronic A-fib, mild intermittent asthma, chronic diastolic CHF, bipolar disorder, chronic peripheral neuropathy, OSA on CPAP, congenital ASD s/p repair, presented with syncope ? ?  ?PT Comments  ? ? Patient seated EOB at beginning of session. She completes seated exercises demonstrating good balance at EOB. Patient does not require assist with transfer to standing but does have increased dizziness upon initial attempt requiring rest break. She is able to ambulate increased distance today with use of RW without loss of balance. She ends session seated in chair at bedside. Patient will benefit from continued skilled physical therapy in hospital and recommended venue below to increase strength, balance, endurance for safe ADLs and gait. ?   ?Recommendations for follow up therapy are one component of a multi-disciplinary discharge planning process, led by the attending physician.  Recommendations may be updated based on patient status, additional functional criteria and insurance authorization. ? ?Follow Up Recommendations ? Home health PT ?  ?  ?Assistance Recommended at Discharge PRN  ?Patient can return home with the following A little help with walking and/or transfers;A little help with bathing/dressing/bathroom;Assist for transportation;Help with stairs or ramp for entrance ?  ?Equipment Recommendations ? None recommended by PT  ?  ?Recommendations for Other Services   ? ? ?  ?Precautions / Restrictions Precautions ?Precautions: Fall ?Precaution Comments: Patient experiences dizziness when standing and changing positions ?Restrictions ?Weight Bearing Restrictions: No  ?  ? ?Mobility ? Bed Mobility ?  ?  ?  ?  ?  ?  ?  ?General bed mobility comments: seated EOB at beginning of session ?   ? ?Transfers ?Overall transfer level: Modified independent ?Equipment used: Rolling walker (2 wheels) ?  ?  ?  ?  ?  ?  ?  ?General transfer comment: Patient able to complete sit to stand and bed to chair/BSC transfers independently with use of RW, c/o dizziness with initial attempt ?  ? ?Ambulation/Gait ?Ambulation/Gait assistance: Modified independent (Device/Increase time) ?Gait Distance (Feet): 80 Feet ?Assistive device: Rolling walker (2 wheels) ?Gait Pattern/deviations: Step-through pattern, Decreased step length - right, Decreased step length - left, Decreased stride length, Trunk flexed ?  ?  ?  ?General Gait Details: ambulates with RW with decreased cadence ? ? ?Stairs ?  ?  ?  ?  ?  ? ? ?Wheelchair Mobility ?  ? ?Modified Rankin (Stroke Patients Only) ?  ? ? ?  ?Balance Overall balance assessment: Needs assistance ?Sitting-balance support: Feet supported ?Sitting balance-Leahy Scale: Good ?Sitting balance - Comments: Patient demonstrates good trunk control and balance sitting EOB with feet supported. ?  ?Standing balance support: Bilateral upper extremity supported, During functional activity, Reliant on assistive device for balance ?Standing balance-Leahy Scale: Fair ?Standing balance comment: good/fair with RW ?  ?  ?  ?  ?  ?  ?  ?  ?  ?  ?  ?  ? ?  ?Cognition Arousal/Alertness: Awake/alert ?Behavior During Therapy: [facility] for tasks assessed/performed ?Overall Cognitive Status: Within Functional Limits for tasks assessed ?  ?  ?  ?  ?  ?  ?  ?  ?  ?  ?  ?  ?  ?  ?  ?  ?  ?  ?  ? ?  ?Exercises General Exercises - Lower Extremity ?Long Arc Quad: AROM,  Both, 10 reps, Seated ?Hip Flexion/Marching: AROM, Both, 10 reps, Seated ?Toe Raises: AROM, Both, 10 reps, Seated ?Heel Raises: AROM, Both, 10 reps, Seated ? ?  ?General Comments   ?  ?  ? ?Pertinent Vitals/Pain Pain Assessment ?Pain Assessment: No/denies pain  ? ? ?Home Living   ?  ?  ?  ?  ?  ?  ?  ?  ?  ?   ?  ?Prior Function    ?  ?  ?   ? ?PT Goals  (current goals can now be found in the care plan section) Acute Rehab PT Goals ?Patient Stated Goal: return home ?PT Goal Formulation: With patient ?Time For Goal Achievement: 02/24/22 ?Potential to Achieve Goals: Good ?Progress towards PT goals: Progressing toward goals ? ?  ?Frequency ? ? ? Min 3X/week ? ? ? ?  ?PT Plan Current plan remains appropriate  ? ? ?Co-evaluation   ?  ?  ?  ?  ? ?  ?AM-PAC PT "6 Clicks" Mobility   ?Outcome Measure ? Help needed turning from your back to your side while in a flat bed without using bedrails?: None ?Help needed moving from lying on your back to sitting on the side of a flat bed without using bedrails?: None ?Help needed moving to and from a bed to a chair (including a wheelchair)?: A Little ?Help needed standing up from a chair using your arms (e.g., wheelchair or bedside chair)?: A Little ?Help needed to walk in hospital room?: A Little ?Help needed climbing 3-5 steps with a railing? : A Lot ?6 Click Score: 19 ? ?  ?End of Session Equipment Utilized During Treatment: Gait belt ?Activity Tolerance: Patient tolerated treatment well;No increased pain;Patient limited by fatigue ?Patient left: in bed;with call bell/phone within reach ?Nurse Communication: Mobility status ?PT Visit Diagnosis: Other abnormalities of gait and mobility (R26.89);Unsteadiness on feet (R26.81);Muscle weakness (generalized) (M62.81);Pain ?  ? ? ?Time: 7412-8786 ?PT Time Calculation (min) (ACUTE ONLY): 24 min ? ?Charges:  $Therapeutic Exercise: 8-22 mins ?$Therapeutic Activity: 8-22 mins          ?          ? ?3:00 PM, 02/11/22 ?Mearl Latin PT, DPT ?Physical Therapist at [facility] ?[facility] ? ? ?

## 2022-02-11 NOTE — Telephone Encounter (Signed)
Received RX for Linzess 145 mcg from [facility] P: [id number] F: [phone].  Pt last saw Neil Crouch, PA-C on 04/27/2021. ?

## 2022-02-11 NOTE — Telephone Encounter (Signed)
I could not find the same pharmacy information listed in epic. ?Please fax printed rx. ?

## 2022-02-11 NOTE — TOC Initial Note (Signed)
Transition of Care (TOC) - Initial/Assessment Note  ? ? ?Patient Details  ?Name: Laura [name] ?MRN: [id number] ?Date of Birth: 1960-10-17 ? ?Transition of Care (TOC) CM/SW Contact:    ?Shade Flood, LCSW ?Phone Number: ?02/11/2022, 11:12 AM ? ?Clinical Narrative:                 ? ?Spoke with pt today to review dc planning. Per pt, the cardiologist has changed some of her medications and reportedly told pt she would remain hospitalized through the weekend. Pt states she plans to return home when stable. Pt agreeable to [facility] at dc. CMS provider options reviewed. Pt has no preference of agency. TOC will arrange [facility] for dc. ? ?Expected Discharge Plan: [place] ?Barriers to Discharge: Continued Medical Work up ? ? ?Patient Goals and CMS Choice ?Patient states their goals for this hospitalization and ongoing recovery are:: go home ?CMS Medicare.gov Compare Post Acute Care list provided to:: Patient ?Choice offered to / list presented to : Patient ? ?Expected Discharge Plan and Services ?Expected Discharge Plan: [place] ?In-house Referral: Clinical Social Work ?  ?Post Acute Care Choice: Home Health ?Living arrangements for the past 2 months: [place] ?                ?  ?  ?  ?  ?  ?  ?  ?  ?  ?  ? ?Prior Living Arrangements/Services ?Living arrangements for the past 2 months: [place] ?Lives with:: Self ?Patient language and need for interpreter reviewed:: Yes ?Do you feel safe going back to the place where you live?: Yes      ?Need for Family Participation in Patient Care: No (Comment) ?  ?Current home services: DME ?Criminal Activity/Legal Involvement Pertinent to Current Situation/Hospitalization: No - Comment as needed ? ?Activities of Daily Living ?Home Assistive Devices/Equipment: Eyeglasses, CBG Meter, Cane (specify quad or straight), Hearing aid, Walker (specify type) ?ADL Screening (condition at time of admission) ?Patient's cognitive ability adequate to  safely complete daily activities?: Yes ?Is the patient deaf or have difficulty hearing?: Yes ?Does the patient have difficulty seeing, even when wearing glasses/contacts?: Yes ?Does the patient have difficulty concentrating, remembering, or making decisions?: Yes ?Patient able to express need for assistance with ADLs?: Yes ?Does the patient have difficulty dressing or bathing?: No ?Independently performs ADLs?: Yes (appropriate for developmental age) ?Does the patient have difficulty walking or climbing stairs?: Yes ?Weakness of Legs: Both ?Weakness of Arms/Hands: None ? ?Permission Sought/Granted ?Permission sought to share information with : Customer service manager ?Permission granted to share information with : Yes, Verbal Permission Granted ?   ? Permission granted to share info w AGENCY: HH ?   ?   ? ?Emotional Assessment ?  ?Attitude/Demeanor/Rapport: Engaged ?Affect (typically observed): Pleasant ?Orientation: : Oriented to Self, Oriented to Place, Oriented to  Time, Oriented to Situation ?Alcohol / Substance Use: Not Applicable ?Psych Involvement: No (comment) ? ?Admission diagnosis:  Syncope [R55] ?Chest pain [R07.9] ?Patient Active Problem List  ? Diagnosis Date Noted  ? Chest pain 02/08/2022  ? Syncope 02/07/2022  ? Class 2 severe obesity due to excess calories with serious comorbidity and body mass index (BMI) of 35.0 to 35.9 in adult [facility]) 01/14/2022  ? Acute cough 12/31/2021  ? 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 Vitamin B12 deficiency 05/21/2021  ? Vitamin D deficiency 05/21/2021  ? Abdominal pain 04/27/2021  ? Urinary incontinence 04/27/2021  ?  Dysphagia 04/27/2021  ? Constipation 04/27/2021  ? Mixed 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 ([place]) 04/10/2021  ? Gastroesophageal reflux disease 04/10/2021  ? Adnexal cyst 01/19/2021  ? Chronic RLQ pain 01/19/2021  ? Mixed stress and urge urinary incontinence 01/19/2021  ? Other fatigue 12/10/2020  ?  Abdominal pain, chronic, right lower quadrant 12/08/2020  ? Abnormal weight loss 12/08/2020  ? Poor appetite 12/08/2020  ? Severe persistent asthma with acute exacerbation 10/01/2019  ? Abnormal EKG   ? Chronic diastolic (congestive) heart failure ([place]) 08/29/2019  ? Shortness of breath 08/29/2019  ? Musculoskeletal chest pain 08/28/2019  ? Headache 08/10/2018  ? H/O atrial flutter 08/10/2018  ? OSA on CPAP 08/10/2018  ? Anxiety 08/10/2018  ? Uncontrolled type 2 diabetes mellitus with hyperglycemia ([place]) 03/13/2018  ? Hypothyroidism 03/13/2018  ? Essential hypertension, benign 03/13/2018  ? Mixed hyperlipidemia 03/13/2018  ? 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 Non-allergic rhinitis 02/06/2018  ? Severe persistent asthma, uncomplicated 75/64/3329  ? Pulmonary nodules 02/06/2018  ? Bronchiectasis without complication ([place]) 51/88/4166  ? Type 2 diabetes mellitus with hyperglycemia, with long-term current use of insulin ([place]) 02/06/2018  ? ?PCP:  [name], MD ?Pharmacy:   ?[id number] - [place], [address] ?[place] ?[place] [address] ?Phone: [phone] Fax: [phone] ? ? ? ? ?Social Determinants of Health (SDOH) Interventions ?  ? ?Readmission Risk Interventions ?   ? View : No data to display.  ?  ?  ?  ? ? ? ?

## 2022-02-11 NOTE — Progress Notes (Signed)
Orthostatic vitals taken and BP did not drop and pulse remained in 60's.   ?

## 2022-02-12 DIAGNOSIS — D839 Common variable immunodeficiency, unspecified: Secondary | ICD-10-CM | POA: Diagnosis not present

## 2022-02-12 LAB — GLUCOSE, CAPILLARY
Glucose-Capillary: 142 mg/dL — ABNORMAL HIGH (ref 70–99)
Glucose-Capillary: 184 mg/dL — ABNORMAL HIGH (ref 70–99)
Glucose-Capillary: 227 mg/dL — ABNORMAL HIGH (ref 70–99)
Glucose-Capillary: 241 mg/dL — ABNORMAL HIGH (ref 70–99)

## 2022-02-12 MED ORDER — IMMUNE GLOBULIN (HUMAN) 10 GM/50ML ~~LOC~~ SOLN
25.0000 g | Freq: Once | SUBCUTANEOUS | Status: AC
  Administered 2022-02-13: 25 g via SUBCUTANEOUS

## 2022-02-12 MED ORDER — IMMUNE GLOBULIN (HUMAN) 10 GM/50ML ~~LOC~~ SOLN: 25.0000 g | Freq: Once | SUBCUTANEOUS | Status: DC

## 2022-02-12 MED ORDER — DOXYCYCLINE HYCLATE 100 MG PO TABS
100.0000 mg | ORAL_TABLET | Freq: Two times a day (BID) | ORAL | Status: AC
  Administered 2022-02-12: 100 mg via ORAL

## 2022-02-12 MED ORDER — FUROSEMIDE 10 MG/ML IJ SOLN
20.0000 mg | Freq: Once | INTRAMUSCULAR | Status: AC
  Administered 2022-02-12: 20 mg via INTRAVENOUS
  Filled 2022-02-12: qty 2

## 2022-02-12 NOTE — Progress Notes (Signed)
Home med , immune globulin, taken to pharmacy for self administration today.  Dr. Clide Deutscher approved and gave order ok to use home med.  After trying to gather the needed equipment from hospital supplies, patient decided that we don't have exactly what she needs and she is going to see if her sister can bring what she needs from home so she administer it tomorrow ?
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
## 2022-02-13 LAB — GLUCOSE, CAPILLARY
Glucose-Capillary: 132 mg/dL — ABNORMAL HIGH (ref 70–99)
Glucose-Capillary: 179 mg/dL — ABNORMAL HIGH (ref 70–99)
Glucose-Capillary: 270 mg/dL — ABNORMAL HIGH (ref 70–99)
Glucose-Capillary: 271 mg/dL — ABNORMAL HIGH (ref 70–99)

## 2022-02-13 LAB — BASIC METABOLIC PANEL
Anion gap: 5 (ref 5–15)
BUN: 18 mg/dL (ref 6–20)
CO2: 30 mmol/L (ref 22–32)
Calcium: 9 mg/dL (ref 8.9–10.3)
Chloride: 105 mmol/L (ref 98–111)
Creatinine, Ser: 0.7 mg/dL (ref 0.44–1.00)
GFR, Estimated: >60 mL/min (ref >60)
Glucose, Bld: 165 mg/dL — ABNORMAL HIGH (ref 70–99)
Potassium: 4 mmol/L (ref 3.5–5.1)
Sodium: 140 mmol/L (ref 135–145)

## 2022-02-13 MED ORDER — PREDNISONE 20 MG PO TABS
20.0000 mg | ORAL_TABLET | Freq: Every day | ORAL | Status: DC
  Administered 2022-02-14: 20 mg via ORAL
  Filled 2022-02-13: qty 1

## 2022-02-13 MED ORDER — FUROSEMIDE 10 MG/ML IJ SOLN
40.0000 mg | Freq: Once | INTRAMUSCULAR | Status: AC
  Administered 2022-02-13: 40 mg via INTRAVENOUS
  Filled 2022-02-13: qty 4
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
## 2022-02-14 DIAGNOSIS — R079 Chest pain, unspecified: Secondary | ICD-10-CM

## 2022-02-14 LAB — CBC
HCT: 36.8 % (ref 36.0–46.0)
Hemoglobin: 11.9 g/dL — ABNORMAL LOW (ref 12.0–15.0)
MCH: 31.5 pg (ref 26.0–34.0)
MCHC: 32.3 g/dL (ref 30.0–36.0)
MCV: 97.4 fL (ref 80.0–100.0)
Platelets: 147 10*3/uL — ABNORMAL LOW (ref 150–400)
RBC: 3.78 MIL/uL — ABNORMAL LOW (ref 3.87–5.11)
RDW: 13.9 % (ref 11.5–15.5)
WBC: 7 10*3/uL (ref 4.0–10.5)
nRBC: 0 % (ref 0.0–0.2)

## 2022-02-14 LAB — BASIC METABOLIC PANEL
Anion gap: 6 (ref 5–15)
BUN: 18 mg/dL (ref 6–20)
CO2: 29 mmol/L (ref 22–32)
Calcium: 8.5 mg/dL — ABNORMAL LOW (ref 8.9–10.3)
Chloride: 105 mmol/L (ref 98–111)
Creatinine, Ser: 0.65 mg/dL (ref 0.44–1.00)
GFR, Estimated: >60 mL/min (ref >60)
Glucose, Bld: 125 mg/dL — ABNORMAL HIGH (ref 70–99)
Potassium: 4 mmol/L (ref 3.5–5.1)
Sodium: 140 mmol/L (ref 135–145)

## 2022-02-14 LAB — MAGNESIUM: Magnesium: 2 mg/dL (ref 1.7–2.4)

## 2022-02-14 LAB — GLUCOSE, CAPILLARY
Glucose-Capillary: 137 mg/dL — ABNORMAL HIGH (ref 70–99)
Glucose-Capillary: 189 mg/dL — ABNORMAL HIGH (ref 70–99)

## 2022-02-14 MED ORDER — LOSARTAN POTASSIUM 25 MG PO TABS: 25.0000 mg | ORAL_TABLET | Freq: Every day | ORAL | 0 refills | Status: DC

## 2022-02-14 MED ORDER — PREDNISONE 10 MG PO TABS: ORAL_TABLET | ORAL | 0 refills | Status: DC

## 2022-02-14 MED ORDER — DILTIAZEM HCL ER COATED BEADS 180 MG PO CP24: 180.0000 mg | ORAL_CAPSULE | Freq: Every day | ORAL | 0 refills | Status: DC

## 2022-02-14 NOTE — Telephone Encounter (Signed)
RX was faxed to pharmacy as requested. Fax confirmation has been received.  ?
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
## 2022-02-14 NOTE — Progress Notes (Signed)
Patient states understanding of discharge instructions.  

## 2022-02-14 NOTE — Progress Notes (Signed)
?Cardiology Progress Note  ?Patient ID: Laura [name] ?MRN: [id number] ?DOB: Jun 17, 1961 ?Date of Encounter: 02/14/2022 ? ?Primary Cardiologist: Carlyle Dolly, MD ? ?Subjective  ? ?Chief Complaint: Weakness ? ?HPI: Heart rate is well controlled.  She has concerns about going home.  She reports she is weak and fatigued. ? ?ROS:  ?All other ROS reviewed and negative. Pertinent positives noted in the HPI.    ? ?Inpatient Medications  ?Scheduled Meds: ? apixaban  5 mg Oral BID  ? vitamin C  500 mg Oral Daily  ? atorvastatin  10 mg Oral QPM  ? budesonide (PULMICORT) nebulizer solution  0.5 mg Nebulization BID  ? chlorhexidine  15 mL Mouth Rinse BID  ? diltiazem  180 mg Oral Daily  ? divalproex  750 mg Oral BID  ? ferrous sulfate  325 mg Oral QPM  ? fluticasone furoate-vilanterol  1 puff Inhalation Daily  ? gabapentin  600 mg Oral TID  ? insulin aspart  0-15 Units Subcutaneous TID WC  ? insulin aspart  3 Units Subcutaneous TID WC  ? insulin glargine-yfgn  60 Units Subcutaneous QHS  ? levalbuterol  0.63 mg Nebulization BID  ? levothyroxine  112 mcg Oral QAC breakfast  ? lidocaine  1 patch Transdermal Q24H  ? linaclotide  145 mcg Oral QAC breakfast  ? losartan  25 mg Oral Daily  ? mouth rinse  15 mL Mouth Rinse q12n4p  ? melatonin  6 mg Oral QHS  ? metFORMIN  500 mg Oral BID WC  ? metoCLOPramide  5 mg Oral TID  ? montelukast  10 mg Oral Daily  ? multivitamin with minerals  1 tablet Oral Daily  ? pantoprazole  40 mg Oral Daily  ? potassium chloride SA  20 mEq Oral Daily  ? predniSONE  20 mg Oral Q breakfast  ? sodium chloride flush  3 mL Intravenous Q12H  ? topiramate  100 mg Oral BID  ? vitamin B-12  1,000 mcg Oral Daily  ? ?Continuous Infusions: ? ?PRN Meds: ?acetaminophen, guaiFENesin-dextromethorphan, HYDROcodone bit-homatropine, HYDROcodone-acetaminophen, hydrOXYzine, ipratropium, levalbuterol, morphine injection, ondansetron **OR** ondansetron (ZOFRAN) IV, polyethylene glycol  ? ?Vital Signs  ? ?Vitals:  ? 02/13/22  2030 02/13/22 2109 02/14/22 0438 02/14/22 0704  ?BP:  133/61 (!) 153/53   ?Pulse: 80 76 69   ?Resp: '18 18 16   '$ ?Temp:  98.1 ?F (36.7 ?C) 98.1 ?F (36.7 ?C)   ?TempSrc:      ?SpO2: 98% 100% 99% 99%  ?Weight:   99.8 kg   ?Height:      ? ? ?Intake/Output Summary (Last 24 hours) at 02/14/2022 0852 ?Last data filed at 02/14/2022 0504 ?Gross per 24 hour  ?Intake 960 ml  ?Output --  ?Net 960 ml  ? ? ?  02/14/2022  ?  4:38 AM 02/13/2022  ?  4:00 AM 02/12/2022  ?  5:00 AM  ?Last 3 Weights  ?Weight (lbs) 220 lb 220 lb 10.9 oz 219 lb 9.3 oz  ?Weight (kg) 99.791 kg 100.1 kg 99.6 kg  ?   ? ?Telemetry  ?Overnight telemetry shows Afib 60s, which I personally reviewed.  ? ? ?Physical Exam  ? ?Vitals:  ? 02/13/22 2030 02/13/22 2109 02/14/22 0438 02/14/22 0704  ?BP:  133/61 (!) 153/53   ?Pulse: 80 76 69   ?Resp: '18 18 16   '$ ?Temp:  98.1 ?F (36.7 ?C) 98.1 ?F (36.7 ?C)   ?TempSrc:      ?SpO2: 98% 100% 99% 99%  ?Weight:   99.8  kg   ?Height:      ?  ?Intake/Output Summary (Last 24 hours) at 02/14/2022 0852 ?Last data filed at 02/14/2022 0504 ?Gross per 24 hour  ?Intake 960 ml  ?Output --  ?Net 960 ml  ?  ? ?  02/14/2022  ?  4:38 AM 02/13/2022  ?  4:00 AM 02/12/2022  ?  5:00 AM  ?Last 3 Weights  ?Weight (lbs) 220 lb 220 lb 10.9 oz 219 lb 9.3 oz  ?Weight (kg) 99.791 kg 100.1 kg 99.6 kg  ?  Body mass index is 37.76 kg/m?.  ? ?General: Well nourished, well developed, in no acute distress ?Head: Atraumatic, normal size  ?Eyes: PEERLA, EOMI  ?Neck: Supple, no JVD ?Endocrine: No thryomegaly ?Cardiac: Normal S1, S2; irregular rhythm  ?Lungs: Clear to auscultation bilaterally, no wheezing, rhonchi or rales  ?Abd: Soft, nontender, no hepatomegaly  ?Ext: No edema, pulses 2+ ?Musculoskeletal: No deformities, BUE and BLE strength normal and equal ?Skin: Warm and dry, no rashes   ?Neuro: Alert and oriented to person, place, time, and situation, CNII-XII grossly intact, no focal deficits  ?Psych: Normal mood and affect  ? ?Labs  ?High Sensitivity Troponin:   ?Recent Labs   ?Lab 02/07/22 ?1756 02/07/22 ?1944  ?TROPONINIHS 20* 21*  ?   ?Cardiac EnzymesNo results for input(s): TROPONINI in the last 168 hours. No results for input(s): TROPIPOC in the last 168 hours.  ?Chemistry ?Recent Labs  ?Lab 02/07/22 ?1800 02/08/22 ?9628 02/11/22 ?3662 02/13/22 ?9476 02/14/22 ?0424  ?NA  --    < > 140 140 140  ?K  --    < > 3.8 4.0 4.0  ?CL  --    < > 106 105 105  ?CO2  --    < > '26 30 29  '$ ?GLUCOSE  --    < > 133* 165* 125*  ?BUN  --    < > 22* 18 18  ?CREATININE  --    < > 0.60 0.70 0.65  ?CALCIUM  --    < > 8.6* 9.0 8.5*  ?PROT 7.5  --   --   --   --   ?ALBUMIN 3.7  --   --   --   --   ?AST 15  --   --   --   --   ?ALT 18  --   --   --   --   ?ALKPHOS 70  --   --   --   --   ?BILITOT 0.5  --   --   --   --   ?GFRNONAA  --    < > >60 >60 >60  ?ANIONGAP  --    < > '8 5 6  '$ ? < > = values in this interval not displayed.  ?  ?Hematology ?Recent Labs  ?Lab 02/07/22 ?1756 02/09/22 ?5465 02/14/22 ?0424  ?WBC 9.1 6.2 7.0  ?RBC 4.42 3.77* 3.78*  ?HGB 14.1 12.4 11.9*  ?HCT 41.2 36.9 36.8  ?MCV 93.2 97.9 97.4  ?MCH 31.9 32.9 31.5  ?MCHC 34.2 33.6 32.3  ?RDW 13.2 13.2 13.9  ?PLT 227 177 147*  ? ?BNP ?Recent Labs  ?Lab 02/07/22 ?1817 02/11/22 ?0442  ?BNP 221.0* 144.0*  ?  ?DDimer No results for input(s): DDIMER in the last 168 hours.  ? ?Radiology  ?No results found. ? ?Cardiac Studies  ?TTE 02/08/2022 ? 1. Left ventricular ejection fraction, by estimation, is 60 to 65%. The  ?left ventricle has normal function. The  left ventricle has no regional  ?wall motion abnormalities. There is mild left ventricular hypertrophy.  ?Left ventricular diastolic parameters  ?are indeterminate.  ? 2. Right ventricular systolic function is normal. The right ventricular  ?size is normal. Tricuspid regurgitation signal is inadequate for assessing  ?PA pressure.  ? 3. Left atrial size was mildly dilated.  ? 4. The mitral valve is abnormal. No evidence of mitral valve  ?regurgitation. Mild mitral stenosis.  ? 5. The aortic valve has an  indeterminant number of cusps. There is mild  ?calcification of the aortic valve. There is mild thickening of the aortic  ?valve. Aortic valve regurgitation is not visualized. No aortic stenosis is  ?present.  ? 6. The inferior vena cava is normal in size with greater than 50%  ?respiratory variability, suggesting right atrial pressure of 3 mmHg.  ? ?Patient Profile  ?Laura [name] is a 61 y.o. female with permanent atrial fibrillation, asthma, CHF, neuropathy who was admitted on 02/07/2022 with syncope. ? ?Assessment & Plan  ? ?#Syncope, orthostatic hypotension ?-Does not appear to be cardiac.  Described profound changes with position.  No further driving for 6 months.  We have recommended abdominal binders as well as a conservative approach.  She should avoid rapid change in position.  She is aware of this. ?-Heart rate is well controlled.  Does not appear to be A-fib related. ?-Symptoms could also be exacerbated by Depakote.  Would defer to hospital medicine. ?-Wilder Glade has been stopped. ?-I discussed with her a conservative approach is the best option.  She is in agreement.  Does not appear to be cardiac related. ?-Would also recommend outpatient neurology evaluation as this could be neurogenic in nature. ? ?#Permanent atrial fibrillation ?-Continue diltiazem extended release 180 mg daily. ?-Continue Eliquis 5 mg twice daily. ? ?#HTN ?-Continue losartan 25 mg daily. ?-We will allow for lenient blood pressure control in the setting of orthostatic hypotension.  She has very profound positional changes. ? ?#HFPEF ?-Home diuretic as needed. ? ?[facility] will sign off.   ?Medication Recommendations: Cardizem extended release 180 mg daily, Eliquis 5 mg twice daily, losartan 25 mg daily, torsemide 20 mg as needed ?Other recommendations (labs, testing, etc): Consideration of changing some of her psychiatric medications as they can exacerbate orthostatic hypotension ?Follow up as an outpatient: She has follow-up later  this week with Dr. Helane Rima ? ?For questions or updates, please contact [place] ?Please consult [email or web address] for contact info under  ? ?Signed, ?[place] T. Audie Box, MD, [facility] ?[place]

## 2022-02-14 NOTE — Care Management Important Message (Signed)
Important Message ? ?Patient Details  ?Name: Laura [name] ?MRN: [id number] ?Date of Birth: 07/31/1961 ? ? ?Medicare Important Message Given:  Yes ? ? ? ? ?Tommy Medal ?02/14/2022, 2:15 PM ?

## 2022-02-14 NOTE — TOC Transition Note (Signed)
Transition of Care (TOC) - CM/SW Discharge Note ? ? ?Patient Details  ?Name: Laura [name] ?MRN: [id number] ?Date of Birth: 1960/10/16 ? ?Transition of Care (TOC) CM/SW Contact:  ?Shade Flood, LCSW ?Phone Number: ?02/14/2022, 12:36 PM ? ? ?Clinical Narrative:    ? ?Pt stable for dc home with [facility] today per MD. Damaris Schooner with pt to review dc plan. Pt remains agreeable to [facility]. Updated pt that the [facility] agency will be [facility] and that they will contact her to schedule in home visits. Pt reports no other TOC needs for dc. ? ?Final next level of care: [facility] ?Barriers to Discharge: Barriers Resolved ? ? ?Patient Goals and CMS Choice ?Patient states their goals for this hospitalization and ongoing recovery are:: go home ?CMS Medicare.gov Compare Post Acute Care list provided to:: Patient ?Choice offered to / list presented to : Patient ? ?Discharge Placement ?  ?           ?  ?  ?  ?  ? ?Discharge Plan and Services ?In-house Referral: Clinical Social Work ?  ?Post Acute Care Choice: Home Health          ?  ?  ?  ?  ?  ?HH Arranged: PT, OT ?[place] Agency: [facility] ?Date HH Agency Contacted: 02/14/22 ?  ?Representative spoke with at [place]: Judson Roch ? ?Social Determinants of Health (SDOH) Interventions ?  ? ? ?Readmission Risk Interventions ?   ? View : No data to display.  ?  ?  ?  ? ? ? ? ? ?

## 2022-02-15 ENCOUNTER — Telehealth: Payer: Self-pay | Admitting: Cardiology

## 2022-02-15 ENCOUNTER — Ambulatory Visit: Payer: Commercial Managed Care - HMO | Admitting: Urology

## 2022-02-15 ENCOUNTER — Other Ambulatory Visit: Payer: Self-pay | Admitting: *Deleted

## 2022-02-15 MED ORDER — FASENRA PEN 30 MG/ML ~~LOC~~ SOAJ
30.0000 mg | SUBCUTANEOUS | 8 refills | Status: DC
Start: 2022-02-15 — End: 2023-02-20

## 2022-02-15 NOTE — Telephone Encounter (Signed)
Pt states that she concerned that her weight is not going down during or after hospital visit. She reports starting Wed she began to gain weight. She reports drinking 6 bottles of water daily. She does feel bloated in her belly. She stated that her scale at home states the same thing as the hospital scale so its not her scale she states. Pt reports that she is not urinating a lot and feels that she is holding fluid. She would like to know what to do about her Torsemide? Pt has appt with Dr. Lovena Le on Wed and Dr. Harl Bowie on Thursday. Please advise.  ?

## 2022-02-15 NOTE — Telephone Encounter (Signed)
Patient requesting advice regarding cuvitru infusion while in the hospital ?

## 2022-02-15 NOTE — Telephone Encounter (Signed)
Pt c/o swelling: STAT is pt has developed SOB within 24 hours ? ?How much weight have you gained and in what time span? 12 lbs while in hosp ? ?If swelling, where is the swelling located? Pt said there is no swelling ? ?Are you currently taking a fluid pill? PRN. Was given one in hosp ? ?Are you currently SOB? Not sure since she just got out of hospital  ? ?Do you have a log of your daily weights (if so, list)? Hosp has records ? ?Have you gained 3 pounds in a day or 5 pounds in a week? Yes ? ?Have you traveled recently? No. Dr Harl Bowie restricted driving.   ? ?Pt wanting to know what to do with her fluid pills since she continues to gain weight.  ?

## 2022-02-16 ENCOUNTER — Encounter: Payer: Self-pay | Admitting: *Deleted

## 2022-02-16 ENCOUNTER — Telehealth: Payer: Self-pay | Admitting: Pulmonary Disease

## 2022-02-16 ENCOUNTER — Other Ambulatory Visit (HOSPITAL_COMMUNITY)
Admission: RE | Admit: 2022-02-16 | Discharge: 2022-02-16 | Disposition: A | Discharge status: Home / Self Care | Payer: Medicare Other | Source: Ambulatory Visit | Attending: Internal Medicine | Admitting: Internal Medicine

## 2022-02-16 ENCOUNTER — Telehealth: Payer: Self-pay | Admitting: "Endocrinology

## 2022-02-16 ENCOUNTER — Other Ambulatory Visit: Payer: Self-pay | Admitting: "Endocrinology

## 2022-02-16 ENCOUNTER — Ambulatory Visit (INDEPENDENT_AMBULATORY_CARE_PROVIDER_SITE_OTHER): Payer: Medicare Other | Admitting: Internal Medicine

## 2022-02-16 ENCOUNTER — Encounter: Payer: Self-pay | Admitting: Internal Medicine

## 2022-02-16 VITALS — BP 122/64 | HR 80 | Ht 63.0 in | Wt 220.8 lb

## 2022-02-16 DIAGNOSIS — I495 Sick sinus syndrome: Secondary | ICD-10-CM

## 2022-02-16 DIAGNOSIS — J4551 Severe persistent asthma with (acute) exacerbation: Secondary | ICD-10-CM | POA: Diagnosis not present

## 2022-02-16 DIAGNOSIS — I4821 Permanent atrial fibrillation: Secondary | ICD-10-CM | POA: Diagnosis not present

## 2022-02-16 DIAGNOSIS — E1142 Type 2 diabetes mellitus with diabetic polyneuropathy: Secondary | ICD-10-CM | POA: Diagnosis not present

## 2022-02-16 DIAGNOSIS — J449 Chronic obstructive pulmonary disease, unspecified: Secondary | ICD-10-CM | POA: Diagnosis not present

## 2022-02-16 DIAGNOSIS — I110 Hypertensive heart disease with heart failure: Secondary | ICD-10-CM | POA: Diagnosis not present

## 2022-02-16 DIAGNOSIS — E1165 Type 2 diabetes mellitus with hyperglycemia: Secondary | ICD-10-CM | POA: Diagnosis not present

## 2022-02-16 DIAGNOSIS — G4733 Obstructive sleep apnea (adult) (pediatric): Secondary | ICD-10-CM | POA: Diagnosis not present

## 2022-02-16 DIAGNOSIS — I5032 Chronic diastolic (congestive) heart failure: Secondary | ICD-10-CM | POA: Diagnosis not present

## 2022-02-16 LAB — CBC
HCT: 40 % (ref 36.0–46.0)
Hemoglobin: 13 g/dL (ref 12.0–15.0)
MCH: 31.9 pg (ref 26.0–34.0)
MCHC: 32.5 g/dL (ref 30.0–36.0)
MCV: 98.3 fL (ref 80.0–100.0)
Platelets: 145 10*3/uL — ABNORMAL LOW (ref 150–400)
RBC: 4.07 MIL/uL (ref 3.87–5.11)
RDW: 14.3 % (ref 11.5–15.5)
WBC: 8.8 10*3/uL (ref 4.0–10.5)
nRBC: 0 % (ref 0.0–0.2)

## 2022-02-16 LAB — BASIC METABOLIC PANEL
Anion gap: 5 (ref 5–15)
BUN: 25 mg/dL — ABNORMAL HIGH (ref 6–20)
CO2: 26 mmol/L (ref 22–32)
Calcium: 8.8 mg/dL — ABNORMAL LOW (ref 8.9–10.3)
Chloride: 110 mmol/L (ref 98–111)
Creatinine, Ser: 0.68 mg/dL (ref 0.44–1.00)
GFR, Estimated: >60 mL/min (ref >60)
Glucose, Bld: 186 mg/dL — ABNORMAL HIGH (ref 70–99)
Potassium: 4.8 mmol/L (ref 3.5–5.1)
Sodium: 141 mmol/L (ref 135–145)

## 2022-02-16 MED ORDER — LANTUS SOLOSTAR 100 UNIT/ML ~~LOC~~ SOPN: 70.0000 [IU] | PEN_INJECTOR | Freq: Every day | SUBCUTANEOUS | 2 refills | Status: DC
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
## 2022-02-16 NOTE — Telephone Encounter (Signed)
Spoke with the pt's pharmacy  ?It's time to refill Breo but they noticed that the pt has also been prescribed flovent recently  ?They are asking if still okay to dispense the Breo  ?Please advise, thanks! ?

## 2022-02-16 NOTE — Telephone Encounter (Signed)
In absence of significant leg swelling of shortness of breath would not take fluid pill. She saw Dr Lovena Le today with plans for pacemaker, no need for our appt tomorrow as this is redundant and she just left the hospital a few days ago, can push ours back 1 month please ? ? ?Zandra Abts MD ?

## 2022-02-16 NOTE — Patient Instructions (Signed)
Medication Instructions:  ?Your physician recommends that you continue on your current medications as directed. Please refer to the Current Medication list given to you today. ? ?*If you need a refill on your cardiac medications before your next appointment, please call your pharmacy* ? ? ?Lab Work: ?Your physician recommends that you return for lab work in: Today  ? ?If you have labs (blood work) drawn today and your tests are completely normal, you will receive your results only by: ?MyChart Message (if you have MyChart) OR ?A paper copy in the mail ?If you have any lab test that is abnormal or we need to change your treatment, we will call you to review the results. ? ? 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 A pacemaker is a small device that is placed under the skin of your chest or abdomen to help control abnormal heart rhythms. This device uses electrical pulses to prompt the heart to beat at a normal rate. Pacemakers are used to treat heart rhythms that are too slow. Wire (leads) are attached to the pacemaker that goes into the chambers of you heart. This is done in the hospital and usually requires and overnight stay. Please see the instruction sheet given to you today for more information. ? ? ? ?Follow-Up: ?At [facility], you and your health needs are our priority.  As part of our continuing mission to provide you with exceptional heart care, we have created designated Provider Care Teams.  These Care Teams include your primary Cardiologist (physician) and Advanced Practice Providers (APPs -  Physician Assistants and Nurse Practitioners) who all work together to provide you with the care you need, when you need it. ? ?We recommend signing up for the patient portal called "MyChart".  Sign up information is provided on this After Visit Summary.  MyChart is used to connect with patients for Virtual Visits (Telemedicine).  Patients are able to view lab/test  results, encounter notes, upcoming appointments, etc.  Non-urgent messages can be sent to your provider as well.   ?To learn more about what you can do with MyChart, go to [email or web address].   ? ?Your next appointment:   ? 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 ?The format for your next appointment:   ?In Person ? ?Provider:   ?Cristopher Peru, MD  ? ? ?Other Instructions ?Thank you for choosing [facility]! ?  ? ?Important Information About [facility] ? ? ? ? ? ? ?

## 2022-02-16 NOTE — Telephone Encounter (Signed)
Pt notified of the need to move appt back one month.  ?

## 2022-02-16 NOTE — Telephone Encounter (Signed)
Pharmacist with [facility] in regards to Lantus, pt was previously on 52 and pharmacist states patient just got out of the hospital and was told to take 70 units. If this is correct the pharmacy is going to need a new prescription. They are requesting a call back [phone].  ?

## 2022-02-16 NOTE — Addendum Note (Signed)
Addended by: Levonne Hubert on: 02/16/2022 10:10 AM ? ? Modules accepted: Orders ? ?

## 2022-02-17 ENCOUNTER — Ambulatory Visit: Payer: Medicare Other | Admitting: Cardiology

## 2022-02-17 NOTE — Telephone Encounter (Signed)
ATC LVMTCB to review inhalers with her.  ?

## 2022-02-18 ENCOUNTER — Telehealth: Payer: Self-pay | Admitting: Cardiology

## 2022-02-18 ENCOUNTER — Telehealth: Payer: Self-pay | Admitting: Internal Medicine

## 2022-02-18 ENCOUNTER — Telehealth: Payer: Self-pay | Admitting: "Endocrinology

## 2022-02-18 ENCOUNTER — Telehealth: Payer: Self-pay

## 2022-02-18 DIAGNOSIS — I110 Hypertensive heart disease with heart failure: Secondary | ICD-10-CM | POA: Diagnosis not present

## 2022-02-18 DIAGNOSIS — E1142 Type 2 diabetes mellitus with diabetic polyneuropathy: Secondary | ICD-10-CM | POA: Diagnosis not present

## 2022-02-18 DIAGNOSIS — J449 Chronic obstructive pulmonary disease, unspecified: Secondary | ICD-10-CM | POA: Diagnosis not present

## 2022-02-18 DIAGNOSIS — I5032 Chronic diastolic (congestive) heart failure: Secondary | ICD-10-CM | POA: Diagnosis not present

## 2022-02-18 DIAGNOSIS — J4551 Severe persistent asthma with (acute) exacerbation: Secondary | ICD-10-CM | POA: Diagnosis not present

## 2022-02-18 DIAGNOSIS — I4821 Permanent atrial fibrillation: Secondary | ICD-10-CM | POA: Diagnosis not present

## 2022-02-18 MED ORDER — FLUTICASONE FUROATE-VILANTEROL 100-25 MCG/ACT IN AEPB: 1.0000 | INHALATION_SPRAY | Freq: Every day | RESPIRATORY_TRACT | 0 refills | Status: DC

## 2022-02-18 MED ORDER — LANTUS SOLOSTAR 100 UNIT/ML ~~LOC~~ SOPN: 70.0000 [IU] | PEN_INJECTOR | Freq: Every day | SUBCUTANEOUS | 0 refills | Status: DC

## 2022-02-18 MED ORDER — LOSARTAN POTASSIUM 25 MG PO TABS: 25.0000 mg | ORAL_TABLET | Freq: Every day | ORAL | 1 refills | Status: DC

## 2022-02-18 MED ORDER — DILTIAZEM HCL ER COATED BEADS 180 MG PO CP24: 180.0000 mg | ORAL_CAPSULE | Freq: Every day | ORAL | 1 refills | Status: DC

## 2022-02-18 NOTE — Telephone Encounter (Signed)
Patient would like to know if she should continue taking torsemide (DEMADEX) 20 MG tablet, because of the weight gain she had while she was in the hospital.  ?

## 2022-02-18 NOTE — Telephone Encounter (Signed)
She would have enough meds until the next visit for Depakote. Hydroxyzine has been ordered by another provider. Please advise her to get refills from that provider.

## 2022-02-18 NOTE — Telephone Encounter (Signed)
?*  STAT* If patient is at the pharmacy, call can be transferred to refill team. ? ? ?1. Which medications need to be refilled? (please list name of each medication and dose if known)  ?diltiazem (CARDIZEM CD) 180 MG 24 hr capsule ?losartan (COZAAR) 25 MG tablet ? ?2. Which pharmacy/location (including street and city if local pharmacy) is medication to be sent to?    [facility] Phone # [phone] ? ?3. Do they need a 30 day or 90 day supply? 90 day  ?

## 2022-02-18 NOTE — Telephone Encounter (Signed)
Patient states needs refill for Breo. Pharmacy is [facility]. [facility] phone number is [phone]. Patient phone number is [phone]. ?

## 2022-02-18 NOTE — Telephone Encounter (Signed)
Called and spoke with patient. Patient verified her pharmacy. Breo was sent in. Nothing further needed.  ?

## 2022-02-18 NOTE — Telephone Encounter (Signed)
pt states she changed pharmacy and that she needs a 90 day supply of the depakote and hydroxyzine ?

## 2022-02-18 NOTE — Telephone Encounter (Signed)
Patient informed and verbalized understanding of plan. 

## 2022-02-18 NOTE — Telephone Encounter (Signed)
I had answered prior message but not sure she was called. If no swelling or SOB then would not take torsemide based on weight gain alone. Can take just prn severe swelling or increased SOB/DOE ? ?Zandra Abts MD ?

## 2022-02-18 NOTE — Telephone Encounter (Signed)
Pt called and said she has switched pharmacy and is needing her Lantus called into [facility] ?

## 2022-02-18 NOTE — Telephone Encounter (Signed)
Rx sent 

## 2022-02-18 NOTE — Telephone Encounter (Signed)
Refilled as requested, seen 02/16/22 by Dr.Taylor. E-scribed to [facility] ?

## 2022-02-22 ENCOUNTER — Telehealth: Payer: Self-pay | Admitting: "Endocrinology

## 2022-02-22 DIAGNOSIS — E1165 Type 2 diabetes mellitus with hyperglycemia: Secondary | ICD-10-CM

## 2022-02-22 MED ORDER — LANTUS SOLOSTAR 100 UNIT/ML ~~LOC~~ SOPN: 70.0000 [IU] | PEN_INJECTOR | Freq: Every day | SUBCUTANEOUS | 0 refills | Status: DC

## 2022-02-22 NOTE — Telephone Encounter (Signed)
Patient's pharmacy, [facility] called and states the patient told them that she is taking 70 units of Lantus but they have it on file that she takes 68. They can not ship out her insulin until they get clarification. The # is [phone] or just fax it. Thank you  ?

## 2022-02-22 NOTE — Telephone Encounter (Signed)
Tried to call [facility] but was unable to get through on telephone number given. Rx for lantus 70 units qhs sent to [facility]. ?

## 2022-02-23 DIAGNOSIS — I110 Hypertensive heart disease with heart failure: Secondary | ICD-10-CM | POA: Diagnosis not present

## 2022-02-23 DIAGNOSIS — E1142 Type 2 diabetes mellitus with diabetic polyneuropathy: Secondary | ICD-10-CM | POA: Diagnosis not present

## 2022-02-23 DIAGNOSIS — J449 Chronic obstructive pulmonary disease, unspecified: Secondary | ICD-10-CM | POA: Diagnosis not present

## 2022-02-23 DIAGNOSIS — J4551 Severe persistent asthma with (acute) exacerbation: Secondary | ICD-10-CM | POA: Diagnosis not present

## 2022-02-23 DIAGNOSIS — I5032 Chronic diastolic (congestive) heart failure: Secondary | ICD-10-CM | POA: Diagnosis not present

## 2022-02-23 DIAGNOSIS — I4821 Permanent atrial fibrillation: Secondary | ICD-10-CM | POA: Diagnosis not present

## 2022-02-24 ENCOUNTER — Encounter: Payer: Self-pay | Admitting: Nutrition

## 2022-02-24 ENCOUNTER — Ambulatory Visit: Payer: Medicare Other

## 2022-02-24 ENCOUNTER — Encounter: Payer: Medicare Other | Attending: Internal Medicine | Admitting: Nutrition

## 2022-02-24 ENCOUNTER — Ambulatory Visit (INDEPENDENT_AMBULATORY_CARE_PROVIDER_SITE_OTHER): Payer: Medicare Other | Admitting: "Endocrinology

## 2022-02-24 ENCOUNTER — Encounter: Payer: Self-pay | Admitting: "Endocrinology

## 2022-02-24 ENCOUNTER — Ambulatory Visit: Payer: Medicare Other | Admitting: "Endocrinology

## 2022-02-24 VITALS — Ht 64.0 in | Wt 214.0 lb

## 2022-02-24 VITALS — BP 146/66 | HR 60 | Ht 63.0 in | Wt 214.8 lb

## 2022-02-24 DIAGNOSIS — E782 Mixed hyperlipidemia: Secondary | ICD-10-CM

## 2022-02-24 DIAGNOSIS — E1165 Type 2 diabetes mellitus with hyperglycemia: Secondary | ICD-10-CM | POA: Diagnosis not present

## 2022-02-24 DIAGNOSIS — I10 Essential (primary) hypertension: Secondary | ICD-10-CM | POA: Diagnosis not present

## 2022-02-24 DIAGNOSIS — E039 Hypothyroidism, unspecified: Secondary | ICD-10-CM | POA: Diagnosis not present

## 2022-02-24 DIAGNOSIS — E559 Vitamin D deficiency, unspecified: Secondary | ICD-10-CM | POA: Diagnosis not present

## 2022-02-24 DIAGNOSIS — E669 Obesity, unspecified: Secondary | ICD-10-CM

## 2022-02-24 DIAGNOSIS — G4733 Obstructive sleep apnea (adult) (pediatric): Secondary | ICD-10-CM

## 2022-02-24 LAB — POCT GLYCOSYLATED HEMOGLOBIN (HGB A1C): HbA1c, POC (controlled diabetic range): 7.8 % — AB (ref 0.0–7.0)

## 2022-02-24 MED ORDER — TRULICITY 3 MG/0.5ML ~~LOC~~ SOAJ: 3.0000 mg | SUBCUTANEOUS | 1 refills | Status: DC

## 2022-02-24 MED ORDER — LANTUS SOLOSTAR 100 UNIT/ML ~~LOC~~ SOPN: 80.0000 [IU] | PEN_INJECTOR | Freq: Every day | SUBCUTANEOUS | 2 refills | Status: DC

## 2022-02-24 NOTE — Patient Instructions (Signed)
Goals  Take care of yourself. Rest and get plenty of sleep Drink water and focus on whole plant based foods.

## 2022-02-24 NOTE — Patient Instructions (Signed)

## 2022-02-24 NOTE — Progress Notes (Signed)
02/24/2022, 4:34 PM   Endocrinology follow-up note   Subjective:    Patient ID: Karisa Nesser, female    DOB: [birth year].  Celia Friedland was seen since 2019 for management of diabetes, also seen during her last visit in relation to her steroid exposure to rule out adrenal insufficiency.  PMD: [name], MD.   Past Medical History:  Diagnosis Date   Asthma    Atrial fibrillation ([place])    Bipolar disorder ([place])    Bronchiectasis ([place])    Carpal tunnel syndrome of right wrist    CHF (congestive heart failure) ([place])    Complication of anesthesia    "my Dr in [place] told me I had an allergic reaction to the combination of the pain meds and the anesthesia" she does not remember what happened but "I eneded up in the ICU for 5 days".   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   Type 2 diabetes mellitus (HCC)    Urticaria    Wound infection    Past Surgical History:  Procedure Laterality Date   ASD REPAIR  1968   BIOPSY  01/26/2021   Procedure: BIOPSY;  Surgeon: [name], DO;  [facility];  Service: Endoscopy;;  gastric   CARDIAC SURGERY     CARDIOVERSION     CARDIOVERSION N/A 08/29/2018   Procedure: CARDIOVERSION;  Surgeon: Arnoldo Lenis, MD;  [facility];  Service: Endoscopy;  Laterality: N/A;   [place]   COLONOSCOPY WITH PROPOFOL N/A 01/26/2021   Procedure: COLONOSCOPY WITH PROPOFOL;  Surgeon: [name], DO;  [facility];  Service: Endoscopy;  Laterality: N/A;  am  appt, diabetic   ESOPHAGOGASTRODUODENOSCOPY (EGD) WITH PROPOFOL N/A 01/26/2021   Procedure: ESOPHAGOGASTRODUODENOSCOPY (EGD) WITH PROPOFOL;  Surgeon: [name], DO;  [facility];  Service: Endoscopy;  Laterality: N/A;   LEFT HEART CATH AND CORONARY ANGIOGRAPHY N/A 08/30/2019   Procedure: LEFT HEART CATH AND CORONARY ANGIOGRAPHY;  Surgeon: [name], MD;  Location: [facility];  Service: Cardiovascular;  Laterality: N/A;   NASAL SINUS SURGERY  2017   POLYPECTOMY  01/26/2021   Procedure: POLYPECTOMY;  Surgeon: [name], DO;  [facility];  Service: Endoscopy;;  colon   Social History   Socioeconomic History   Marital status: Widowed    Spouse name: Not on file   Number of children: Not on file   Years of education: Not on file   Highest education level: Not on file  Occupational History   Not on file  Tobacco Use   Smoking status: Former    Packs/day: 1.00  Years: 28.00    Pack years: 28.00    Types: Cigarettes    Start date: 77    Quit date: 2008    Years since quitting: 15.3   Smokeless tobacco: Never  Vaping Use   Vaping Use: Never used  Substance and Sexual Activity   Alcohol use: Not Currently   Drug use: Not Currently   Sexual activity: Not Currently    Birth control/protection: Post-menopausal  Other Topics Concern   Not on file  Social History Narrative   Not on file   Social Determinants of Health   Financial Resource Strain: High Risk   Difficulty of Paying Living Expenses: Hard  Food Insecurity: No Food Insecurity   Worried About Running Out of Food in the Last Year: Never true   Ran Out of Food in the Last Year: Never true  Transportation Needs: No Transportation Needs   Lack of Transportation (Medical): No   Lack of Transportation (Non-Medical): No  Physical Activity: Insufficiently Active   Days of Exercise per Week: 4 days   Minutes of Exercise per Session: 30 min  Stress: No Stress Concern  Present   Feeling of Stress : Only a little  Social Connections: Moderately Integrated   Frequency of Communication with Friends and Family: More than three times a week   Frequency of Social Gatherings with Friends and Family: More than three times a week   Attends Religious Services: More than 4 times per year   Active Member of [employer] or Organizations: Yes   Attends Archivist Meetings: More than 4 times per year   Marital Status: Widowed   Outpatient Encounter Medications as of 02/24/2022  Medication Sig   Dulaglutide (TRULICITY) 3 IZ/1.2WP SOPN Inject 3 mg as directed once a week.   acetaminophen (TYLENOL) 325 MG tablet Take 2 tablets (650 mg total) by mouth every 6 (six) hours as needed for mild pain, fever or headache.   albuterol (PROVENTIL) (2.5 MG/3ML) 0.083% nebulizer solution Take 3 mLs (2.5 mg total) by nebulization every 4 (four) hours as needed for wheezing or shortness of breath.   albuterol (VENTOLIN HFA) 108 (90 Base) MCG/ACT inhaler INHALE 2 PUFFS INTO THE LUNGS EVERY 6 HOURS AS NEEDED FOR WHEEZING OR SHORTNESS OF BREATH   apixaban (ELIQUIS) 5 MG TABS tablet Take 1 tablet (5 mg total) by mouth 2 (two) times daily.   atorvastatin (LIPITOR) 10 MG tablet Take 10 mg by mouth every evening.    BD PEN NEEDLE NANO 2ND GEN 32G X 4 MM MISC 1 each by Other route in the morning, at noon, in the evening, and at bedtime.   Benralizumab (FASENRA PEN) 30 MG/ML SOAJ Inject 1 mL (30 mg total) into the skin every 28 (twenty-eight) days. Then every 8 weeks   blood glucose meter kit and supplies 1 each by Other route 4 (four) times daily. Dispense based on patient and insurance preference. Use up to four times daily as directed. (FOR ICD-10 E10.9, E11.9).   Cholecalciferol (VITAMIN D3) 125 MCG (5000 UT) CAPS Take 1 capsule (5,000 Units total) by mouth daily.   Continuous Blood Gluc Sensor (FREESTYLE LIBRE 3 SENSOR) MISC 1 Piece by Does not apply route every 14 (fourteen) days. Place 1  sensor on the skin every 14 days. Use to check glucose continuously   CUVITRU 10 GM/50ML SOLN Inject 25 mg into the skin.   diltiazem (CARDIZEM CD) 180 MG 24 hr capsule Take 1 capsule (180 mg total) by mouth daily.  divalproex (DEPAKOTE ER) 500 MG 24 hr tablet Take 1 tablet (500 mg total) by mouth daily AND 2 tablets (1,000 mg total) at bedtime.   ELDERBERRY PO Take 4 g by mouth daily. Gummies 2g each   EPINEPHrine 0.3 mg/0.3 mL IJ SOAJ injection Inject 0.3 mg into the muscle once as needed for anaphylaxis.   ferrous sulfate 324 MG TBEC Take 324 mg by mouth every evening.   fluticasone (FLOVENT HFA) 110 MCG/ACT inhaler Inhale 2 puffs into the lungs 2 (two) times daily.   fluticasone furoate-vilanterol (BREO ELLIPTA) 100-25 MCG/ACT AEPB Inhale 1 puff into the lungs daily.   fluticasone furoate-vilanterol (BREO ELLIPTA) 100-25 MCG/ACT AEPB Inhale 1 puff into the lungs daily.   gabapentin (NEURONTIN) 600 MG tablet Take 600 mg by mouth 3 (three) times daily.   hydrOXYzine (ATARAX/VISTARIL) 25 MG tablet Take 1 tablet (25 mg total) by mouth every 6 (six) hours as needed for anxiety or nausea.   Immune Globulin, Human, (CUVITRU [place]) Inject 25 mg into the skin every 14 (fourteen) days.   insulin glargine (LANTUS SOLOSTAR) 100 UNIT/ML Solostar Pen Inject 80 Units into the skin at bedtime.   Insulin Pen Needle (PEN NEEDLES 3/16") 31G X 5 MM MISC Use as directed with insulin pen   levothyroxine (SYNTHROID, LEVOTHROID) 112 MCG tablet Take 112 mcg by mouth daily before breakfast.   lidocaine-prilocaine (EMLA) cream Apply topically.   linaclotide (LINZESS) 145 MCG CAPS capsule Take 1 capsule (145 mcg total) by mouth daily before breakfast. Office visit needed before further refills   losartan (COZAAR) 25 MG tablet Take 1 tablet (25 mg total) by mouth daily.   MELATONIN GUMMIES PO Take 10 mg by mouth at bedtime.   metFORMIN (GLUCOPHAGE) 500 MG tablet Take 500 mg by mouth 2 (two) times daily.   metoCLOPramide  (REGLAN) 5 MG tablet Take 5 mg by mouth 3 (three) times daily.   montelukast (SINGULAIR) 10 MG tablet Take 1 tablet by mouth daily.   Multiple Vitamins-Minerals (MULTIVITAMIN WITH MINERALS) tablet Take 1 tablet by mouth daily. Woman   Nebulizer MISC Nebulizer tubing kit   omeprazole (PRILOSEC) 20 MG capsule Take 1 capsule (20 mg total) by mouth 2 (two) times daily before a meal.   ondansetron (ZOFRAN-ODT) 8 MG disintegrating tablet Take 8 mg by mouth daily as needed for vomiting or nausea.   ONETOUCH ULTRA test strip 1 each by Other route in the morning, at noon, in the evening, and at bedtime.   polyethylene glycol (MIRALAX / GLYCOLAX) 17 g packet Take 17 g by mouth daily as needed.   Potassium Chloride ER 20 MEQ TBCR Take 20 mEq by mouth daily.   Spacer/Aero-Holding Chambers (AEROCHAMBER PLUS) inhaler 1 each by Other route as needed for other. Use as instructed   topiramate (TOPAMAX) 100 MG tablet Take 1 tablet (100 mg total) by mouth 2 (two) times daily.   torsemide (DEMADEX) 20 MG tablet Take 1 tablet daily for fluid gain of 3 lb overnight or 5 lbs in a week.   triamcinolone cream (KENALOG) 0.1 % Apply 1 application topically 2 (two) times daily.   vitamin B-12 (CYANOCOBALAMIN) 1000 MCG tablet Take 1,000 mcg by mouth daily.   vitamin C (ASCORBIC ACID) 500 MG tablet Take 500 mg by mouth daily. Power C immune support   [DISCONTINUED] insulin glargine (LANTUS SOLOSTAR) 100 UNIT/ML Solostar Pen Inject 70 Units into the skin at bedtime.   [DISCONTINUED] predniSONE (DELTASONE) 10 MG tablet Take 2 tablets (20 mg total)  by mouth daily with breakfast for 3 days, THEN 1 tablet (10 mg total) daily with breakfast for 3 days, THEN 0.5 tablets (5 mg total) daily with breakfast for 4 days.   [DISCONTINUED] TRULICITY 1.5 AU/6.3FH SOPN ADMINISTER 1.5 MG UNDER THE SKIN ONCE A WEEK ON SUNDAY   Facility-Administered Encounter Medications as of 02/24/2022  Medication   Benralizumab SOSY 30 mg     ALLERGIES: Allergies  Allergen Reactions   Ativan [Lorazepam] Hives   Phenergan [Promethazine Hcl] Hives    VACCINATION STATUS: Immunization History  Administered Date(s) Administered   Influenza Split 08/24/2018   Influenza Whole 07/15/2019   Influenza,inj,Quad PF,6+ Mos 07/10/2017, 10/27/2021   Moderna Sars-Covid-2 Vaccination 12/31/2019, 01/30/2020   Pneumococcal-Unspecified 10/10/2012    Diabetes She presents for her follow-up diabetic visit. She has type 2 diabetes mellitus. Onset time: She was diagnosed at approximate age of 80 years, stated by gestational diabetes with all 3 of her pregnancies in her 20s. Her disease course has been improving (Since her last visit, she fell and injured herself.  CT scan of the head did not show any intracranial bleeding.Marland Kitchen). There are no hypoglycemic associated symptoms. Pertinent negatives for hypoglycemia include no confusion, headaches, pallor or seizures. Associated symptoms include fatigue. Pertinent negatives for diabetes include no blurred vision, no chest pain, no polydipsia, no polyphagia and no polyuria. There are no hypoglycemic complications. Symptoms are improving. Diabetic complications include a CVA and heart disease. (She has history of open heart surgery for ASD in 1965.) Risk factors for coronary artery disease include dyslipidemia, diabetes mellitus, family history, obesity, hypertension, post-menopausal, sedentary lifestyle and tobacco exposure. Current diabetic treatments: She is currently on Lantus 45 units nightly, Humalog 7 units 3 times daily AC, Metformin 1000 g p.o. twice daily, Trulicity 1.5 mg subcutaneou 50 units nig. Her weight is increasing steadily. She is following a generally unhealthy diet. When asked about meal planning, she reported none. She has not had a previous visit with a dietitian. She rarely (She has medical problem with bronchiectasis for which she gets treated with steroids on and off.) participates in  exercise. Her home blood glucose trend is increasing steadily. Her breakfast blood glucose range is generally >200 mg/dl. Her lunch blood glucose range is generally >200 mg/dl. Her dinner blood glucose range is generally >200 mg/dl. Her bedtime blood glucose range is generally >200 mg/dl. Her overall blood glucose range is >200 mg/dl. (Ms. Beed presents with worsening glycemic profile compared to last visit.  Her freestyle libre device was downloaded.  AGP report shows 38% time range, 29% level 1 hyperglycemia, 33% level 2 hyperglycemia.  She does not have any hypoglycemia.  Her point-of-care A1c was 7.8%.   ) An ACE inhibitor/angiotensin II receptor blocker is being taken.  Hyperlipidemia This is a chronic problem. The current episode started more than 1 year ago. Exacerbating diseases include diabetes and obesity. Associated symptoms include myalgias. Pertinent negatives include no chest pain or shortness of breath. Current antihyperlipidemic treatment includes statins. Risk factors for coronary artery disease include dyslipidemia, diabetes mellitus, family history, obesity, hypertension, a sedentary lifestyle and post-menopausal.  Hypertension This is a chronic problem. The current episode started more than 1 year ago. The problem is uncontrolled. Pertinent negatives include no blurred vision, chest pain, headaches, palpitations or shortness of breath. Risk factors for coronary artery disease include dyslipidemia, diabetes mellitus, obesity, sedentary lifestyle and smoking/tobacco exposure. Past treatments include ACE inhibitors. Hypertensive end-organ damage includes CVA.  Other The current episode started more than  1 month ago. The problem occurs constantly. Progression since onset: Patient reports that she did have exposure to steroids related to back pain on 2 separate occasions between November 2000 21 and 2022.  Last exposure was related to her COVID infection.  She reports that she developed  fatigueced that she developed fatigue. Associated symptoms include fatigue and myalgias. Pertinent negatives include no arthralgias, chest pain, chills, coughing, fever, headaches, nausea, rash or vomiting. She has tried nothing for the symptoms. The treatment provided no relief.     Objective:    BP (!) 146/66   Pulse 60   Ht $R'5\' 3"'ct$  (1.6 m)   Wt 214 lb 12.8 oz (97.4 kg)   BMI 38.05 kg/m   Wt Readings from Last 3 Encounters:  02/24/22 214 lb (97.1 kg)  02/24/22 214 lb 12.8 oz (97.4 kg)  02/16/22 220 lb 12.8 oz (100.2 kg)     Recent Results (from the past 2160 hour(s))  Troponin I (High Sensitivity)     Status: None   Collection Time: 12/09/21 11:26 AM  Result Value Ref Range   Troponin I (High Sensitivity) 15 <18 ng/L    Comment: (NOTE) Elevated high sensitivity troponin I (hsTnI) values and significant  changes across serial measurements may suggest ACS but many other  chronic and acute conditions are known to elevate hsTnI results.  Refer to the "Links" section for chest pain algorithms and additional  guidance. Performed at [facility], [address], [place], [place] [address]   Basic metabolic panel     Status: Abnormal   Collection Time: 12/09/21 11:26 AM  Result Value Ref Range   Sodium 139 135 - 145 mmol/L   Potassium 4.5 3.5 - 5.1 mmol/L   Chloride 105 98 - 111 mmol/L   CO2 22 22 - 32 mmol/L   Glucose, Bld 175 (H) 70 - 99 mg/dL    Comment: Glucose reference range applies only to samples taken after fasting for at least 8 hours.   BUN 25 (H) 6 - 20 mg/dL   Creatinine, Ser 0.76 0.44 - 1.00 mg/dL   Calcium 9.6 8.9 - 10.3 mg/dL   GFR, Estimated >60 >60 mL/min    Comment: (NOTE) Calculated using the CKD-EPI Creatinine Equation (2021)    Anion gap 12 5 - 15    Comment: Performed at [facility], [address], [place], [place] [address]  CBC with Differential     Status: None   Collection Time: 12/09/21 11:26 AM  Result Value Ref Range   WBC 4.4 4.0 - 10.5 K/uL    RBC 4.38 3.87 - 5.11 MIL/uL   Hemoglobin 14.2 12.0 - 15.0 g/dL   HCT 40.6 36.0 - 46.0 %   MCV 92.7 80.0 - 100.0 fL   MCH 32.4 26.0 - 34.0 pg   MCHC 35.0 30.0 - 36.0 g/dL   RDW 12.4 11.5 - 15.5 %   Platelets 162 150 - 400 K/uL   nRBC 0.0 0.0 - 0.2 %   Neutrophils Relative % 59 %   Neutro Abs 2.6 1.7 - 7.7 K/uL   Lymphocytes Relative 31 %   Lymphs Abs 1.4 0.7 - 4.0 K/uL   Monocytes Relative 8 %   Monocytes Absolute 0.4 0.1 - 1.0 K/uL   Eosinophils Relative 0 %   Eosinophils Absolute 0.0 0.0 - 0.5 K/uL   Basophils Relative 1 %   Basophils Absolute 0.0 0.0 - 0.1 K/uL   Immature Granulocytes 1 %   Abs Immature Granulocytes  0.03 0.00 - 0.07 K/uL    Comment: Performed at [facility], [address] [place], [place] [address]  Protime-INR     Status: Abnormal   Collection Time: 12/09/21 11:26 AM  Result Value Ref Range   Prothrombin Time 15.3 (H) 11.4 - 15.2 seconds   INR 1.2 0.8 - 1.2    Comment: (NOTE) INR goal varies based on device and disease states. Performed at [facility], [address] [place], [place] [address]   Troponin I (High Sensitivity)     Status: None   Collection Time: 12/09/21  1:11 PM  Result Value Ref Range   Troponin I (High Sensitivity) 15 <18 ng/L    Comment: (NOTE) Elevated high sensitivity troponin I (hsTnI) values and significant  changes across serial measurements may suggest ACS but many other  chronic and acute conditions are known to elevate hsTnI results.  Refer to the "Links" section for chest pain algorithms and additional  guidance. Performed at [facility], [address], [place], [place] [address]   CBC with Differential     Status: Abnormal   Collection Time: 12/20/21  3:12 PM  Result Value Ref Range   WBC 5.4 4.0 - 10.5 K/uL   RBC 4.57 3.87 - 5.11 MIL/uL   Hemoglobin 14.9 12.0 - 15.0 g/dL   HCT 55.3 36.4 - 75.8 %   MCV 90.4 80.0 - 100.0 fL   MCH 32.6 26.0 - 34.0 pg   MCHC 36.1 (H) 30.0 - 36.0 g/dL   RDW 67.0 56.0 - 72.2 %    Platelets 208 150 - 400 K/uL   nRBC 0.0 0.0 - 0.2 %   Neutrophils Relative % 59 %   Neutro Abs 3.2 1.7 - 7.7 K/uL   Lymphocytes Relative 32 %   Lymphs Abs 1.7 0.7 - 4.0 K/uL   Monocytes Relative 7 %   Monocytes Absolute 0.4 0.1 - 1.0 K/uL   Eosinophils Relative 0 %   Eosinophils Absolute 0.0 0.0 - 0.5 K/uL   Basophils Relative 1 %   Basophils Absolute 0.0 0.0 - 0.1 K/uL   Immature Granulocytes 1 %   Abs Immature Granulocytes 0.07 0.00 - 0.07 K/uL    Comment: Performed at [facility], [address], [place], [place] [address]  Basic metabolic panel     Status: Abnormal   Collection Time: 12/20/21  3:12 PM  Result Value Ref Range   Sodium 142 135 - 145 mmol/L   Potassium 3.2 (L) 3.5 - 5.1 mmol/L   Chloride 104 98 - 111 mmol/L   CO2 25 22 - 32 mmol/L   Glucose, Bld 128 (H) 70 - 99 mg/dL    Comment: Glucose reference range applies only to samples taken after fasting for at least 8 hours.   BUN 31 (H) 6 - 20 mg/dL   Creatinine, Ser 4.08 0.44 - 1.00 mg/dL   Calcium 9.4 8.9 - 20.1 mg/dL   GFR, Estimated >00 >46 mL/min    Comment: (NOTE) Calculated using the CKD-EPI Creatinine Equation (2021)    Anion gap 13 5 - 15    Comment: Performed at [facility], [address], [place], [place] [address]  Magnesium     Status: None   Collection Time: 12/20/21  3:12 PM  Result Value Ref Range   Magnesium 1.8 1.7 - 2.4 mg/dL    Comment: Performed at [facility], [address], [place], [place] [address]  Troponin I (High Sensitivity)     Status: None  Collection Time: 12/20/21  3:12 PM  Result Value Ref Range   Troponin I (High Sensitivity) 14 <18 ng/L    Comment: (NOTE) Elevated high sensitivity troponin I (hsTnI) values and significant  changes across serial measurements may suggest ACS but many other  chronic and acute conditions are known to elevate hsTnI results.  Refer to the "Links" section for chest pain algorithms and additional  guidance. Performed at [facility], [address] [place], [place] [address]   Troponin I (High Sensitivity)     Status: None   Collection Time: 12/20/21  5:18 PM  Result Value Ref Range   Troponin I (High Sensitivity) 16 <18 ng/L    Comment: (NOTE) Elevated high sensitivity troponin I (hsTnI) values and significant  changes across serial measurements may suggest ACS but many other  chronic and acute conditions are known to elevate hsTnI results.  Refer to the "Links" section for chest pain algorithms and additional  guidance. Performed at [facility], [address] [place], [place] [address]   Cytology - PAP     Status: None   Collection Time: 01/06/22  9:39 AM  Result Value Ref Range   High risk HPV Negative    Neisseria Gonorrhea Negative    Chlamydia Negative    Adequacy      Satisfactory for evaluation; transformation zone component ABSENT.   Diagnosis      - Negative for intraepithelial lesion or malignancy (NILM)   Comment Normal Reference Range HPV - Negative    Comment Normal Reference [place] Chlamydia - Negative    Comment      Normal Reference Range Neisseria Gonorrhea - Negative  Valproic acid level     Status: None   Collection Time: 01/11/22 11:47 AM  Result Value Ref Range   Valproic Acid Lvl 51 50.0 - 100.0 ug/mL    Comment: Performed at [facility], [address], [place], [place] [address]  HM DIABETES EYE EXAM     Status: None   Collection Time: 01/12/22 12:09 PM  Result Value Ref Range   HM Diabetic Eye Exam No Retinopathy No Retinopathy  Cortisol     Status: None   Collection Time: 01/13/22  1:10 PM  Result Value Ref Range   Cortisol, Plasma 7.3 ug/dL    Comment: (NOTE) AM    6.7 - 22.6 ug/dL PM   <10.0       ug/dL Performed at [facility] [address], [place],  [address]   Resp Panel by RT-PCR (Flu A&B, Covid) Nasopharyngeal Swab     Status: None   Collection Time: 01/31/22  3:19 PM   Specimen: Nasopharyngeal Swab; Nasopharyngeal(NP) swabs in vial transport medium   Result Value Ref Range   SARS Coronavirus 2 by RT PCR NEGATIVE NEGATIVE    Comment: (NOTE) SARS-CoV-2 target nucleic acids are NOT DETECTED.  The SARS-CoV-2 RNA is generally detectable in upper respiratory specimens during the acute phase of infection. The lowest concentration of SARS-CoV-2 viral copies this assay can detect is 138 copies/mL. A negative result does not preclude SARS-Cov-2 infection and should not be used as the sole basis for treatment or other patient management decisions. A negative result may occur with  improper specimen collection/handling, submission of specimen other than nasopharyngeal swab, presence of viral mutation(s) within the areas targeted by this assay, and inadequate number of viral copies(<138 copies/mL). A negative result must be combined with clinical observations, patient history, and epidemiological information. The expected result  is Negative.  Fact Sheet for Patients:  EntrepreneurPulse.com.au  Fact Sheet for Healthcare Providers:  IncredibleEmployment.be  This test is no t yet approved or cleared by the [place] FDA and  has been authorized for detection and/or diagnosis of SARS-CoV-2 by FDA under an Emergency Use Authorization (EUA). This EUA will remain  in effect (meaning this test can be used) for the duration of the COVID-19 declaration under Section 564(b)(1) of the Act, 21 U.S.C.section 360bbb-3(b)(1), unless the authorization is terminated  or revoked sooner.       Influenza A by PCR NEGATIVE NEGATIVE   Influenza B by PCR NEGATIVE NEGATIVE    Comment: (NOTE) The Xpert Xpress SARS-CoV-2/FLU/RSV plus assay is intended as an aid in the diagnosis of influenza from Nasopharyngeal swab specimens and should not be used as a sole basis for treatment. Nasal washings and aspirates are unacceptable for Xpert Xpress SARS-CoV-2/FLU/RSV testing.  Fact Sheet for  Patients: EntrepreneurPulse.com.au  Fact Sheet for Healthcare Providers: IncredibleEmployment.be  This test is not yet approved or cleared by the [place] FDA and has been authorized for detection and/or diagnosis of SARS-CoV-2 by FDA under an Emergency Use Authorization (EUA). This EUA will remain in effect (meaning this test can be used) for the duration of the COVID-19 declaration under Section 564(b)(1) of the Act, 21 U.S.C. section 360bbb-3(b)(1), unless the authorization is terminated or revoked.  Performed at [facility], [address] [place], [place] [address]   Comprehensive metabolic panel     Status: Abnormal   Collection Time: 01/31/22  8:24 PM  Result Value Ref Range   Sodium 139 135 - 145 mmol/L   Potassium 4.0 3.5 - 5.1 mmol/L   Chloride 111 98 - 111 mmol/L   CO2 21 (L) 22 - 32 mmol/L   Glucose, Bld 122 (H) 70 - 99 mg/dL    Comment: Glucose reference range applies only to samples taken after fasting for at least 8 hours.   BUN 24 (H) 6 - 20 mg/dL   Creatinine, Ser 0.59 0.44 - 1.00 mg/dL   Calcium 8.7 (L) 8.9 - 10.3 mg/dL   Total Protein 7.6 6.5 - 8.1 g/dL   Albumin 3.6 3.5 - 5.0 g/dL   AST 11 (L) 15 - 41 U/L   ALT 10 0 - 44 U/L   Alkaline Phosphatase 65 38 - 126 U/L   Total Bilirubin 0.4 0.3 - 1.2 mg/dL   GFR, Estimated >60 >60 mL/min    Comment: (NOTE) Calculated using the CKD-EPI Creatinine Equation (2021)    Anion gap 7 5 - 15    Comment: Performed at [facility], [address] [place], [place] [address]  CBC with Differential/Platelet     Status: Abnormal   Collection Time: 01/31/22  8:24 PM  Result Value Ref Range   WBC 4.9 4.0 - 10.5 K/uL   RBC 3.79 (L) 3.87 - 5.11 MIL/uL   Hemoglobin 12.1 12.0 - 15.0 g/dL   HCT 36.4 36.0 - 46.0 %   MCV 96.0 80.0 - 100.0 fL   MCH 31.9 26.0 - 34.0 pg   MCHC 33.2 30.0 - 36.0 g/dL   RDW 13.5 11.5 - 15.5 %   Platelets 156 150 - 400 K/uL   nRBC 0.0 0.0 - 0.2 %    Neutrophils Relative % 56 %   Neutro Abs 2.7 1.7 - 7.7 K/uL   Lymphocytes Relative 28 %   Lymphs Abs 1.4 0.7 - 4.0 K/uL   Monocytes Relative 14 %  Monocytes Absolute 0.7 0.1 - 1.0 K/uL   Eosinophils Relative 0 %   Eosinophils Absolute 0.0 0.0 - 0.5 K/uL   Basophils Relative 1 %   Basophils Absolute 0.0 0.0 - 0.1 K/uL   Immature Granulocytes 1 %   Abs Immature Granulocytes 0.07 0.00 - 0.07 K/uL    Comment: Performed at [facility], [address] [place], [place] [address]  D-dimer, quantitative     Status: None   Collection Time: 01/31/22  8:24 PM  Result Value Ref Range   D-Dimer, Quant <0.27 0.00 - 0.50 ug/mL-FEU    Comment: (NOTE) At the manufacturer cut-off value of 0.5 g/mL FEU, this assay has a negative predictive value of 95-100%.This assay is intended for use in conjunction with a clinical pretest probability (PTP) assessment model to exclude pulmonary embolism (PE) and deep venous thrombosis (DVT) in outpatients suspected of PE or DVT. Results should be correlated with clinical presentation. Performed at [facility], [address], [place], [place] [address]   Brain natriuretic peptide     Status: Abnormal   Collection Time: 01/31/22  8:24 PM  Result Value Ref Range   B Natriuretic Peptide 232.0 (H) 0.0 - 100.0 pg/mL    Comment: Performed at [facility], [address] [place], [place] [address]  Magnesium     Status: None   Collection Time: 01/31/22  8:24 PM  Result Value Ref Range   Magnesium 2.2 1.7 - 2.4 mg/dL    Comment: Performed at [facility], [address] [place], [place] [address]  Basic metabolic panel     Status: Abnormal   Collection Time: 02/07/22  5:56 PM  Result Value Ref Range   Sodium 139 135 - 145 mmol/L   Potassium 4.1 3.5 - 5.1 mmol/L   Chloride 109 98 - 111 mmol/L   CO2 21 (L) 22 - 32 mmol/L   Glucose, Bld 155 (H) 70 - 99 mg/dL    Comment: Glucose reference range applies only to samples taken after fasting for at least 8 hours.   BUN  19 6 - 20 mg/dL   Creatinine, Ser 0.60 0.44 - 1.00 mg/dL   Calcium 8.8 (L) 8.9 - 10.3 mg/dL   GFR, Estimated >60 >60 mL/min    Comment: (NOTE) Calculated using the CKD-EPI Creatinine Equation (2021)    Anion gap 9 5 - 15    Comment: Performed at [facility], [address] [place], [place] [address]  CBC     Status: None   Collection Time: 02/07/22  5:56 PM  Result Value Ref Range   WBC 9.1 4.0 - 10.5 K/uL   RBC 4.42 3.87 - 5.11 MIL/uL   Hemoglobin 14.1 12.0 - 15.0 g/dL   HCT 41.2 36.0 - 46.0 %   MCV 93.2 80.0 - 100.0 fL   MCH 31.9 26.0 - 34.0 pg   MCHC 34.2 30.0 - 36.0 g/dL   RDW 13.2 11.5 - 15.5 %   Platelets 227 150 - 400 K/uL   nRBC 0.0 0.0 - 0.2 %    Comment: Performed at [facility], [address], [place],  [address]  Troponin I (High Sensitivity)     Status: Abnormal   Collection Time: 02/07/22  5:56 PM  Result Value Ref Range   Troponin I (High Sensitivity) 20 (H) <18 ng/L    Comment: (NOTE) Elevated high sensitivity troponin I (hsTnI) values and significant  changes across serial measurements may suggest ACS but many other  chronic and acute  conditions are known to elevate hsTnI results.  Refer to the "Links" section for chest pain algorithms and additional  guidance. Performed at [facility], [address], [place], [place] [address]   Magnesium     Status: None   Collection Time: 02/07/22  6:00 PM  Result Value Ref Range   Magnesium 1.9 1.7 - 2.4 mg/dL    Comment: Performed at [facility], [address], [place], [place] [address]  Hepatic function panel     Status: None   Collection Time: 02/07/22  6:00 PM  Result Value Ref Range   Total Protein 7.5 6.5 - 8.1 g/dL   Albumin 3.7 3.5 - 5.0 g/dL   AST 15 15 - 41 U/L   ALT 18 0 - 44 U/L   Alkaline Phosphatase 70 38 - 126 U/L   Total Bilirubin 0.5 0.3 - 1.2 mg/dL   Bilirubin, Direct 0.1 0.0 - 0.2 mg/dL   Indirect Bilirubin 0.4 0.3 - 0.9 mg/dL    Comment: Performed at [facility], [address] [place], [place] [address]  Lipase, blood     Status: None   Collection Time: 02/07/22  6:00 PM  Result Value Ref Range   Lipase 37 11 - 51 U/L    Comment: Performed at [facility], [address] [place], [place] [address]  Brain natriuretic peptide     Status: Abnormal   Collection Time: 02/07/22  6:17 PM  Result Value Ref Range   B Natriuretic Peptide 221.0 (H) 0.0 - 100.0 pg/mL    Comment: Performed at [facility], [address] [place], [place] [address]  Troponin I (High Sensitivity)     Status: Abnormal   Collection Time: 02/07/22  7:44 PM  Result Value Ref Range   Troponin I (High Sensitivity) 21 (H) <18 ng/L    Comment: (NOTE) Elevated high sensitivity troponin I (hsTnI) values and significant  changes across serial measurements may suggest ACS but many other  chronic and acute conditions are known to elevate hsTnI results.  Refer to the "Links" section for chest pain algorithms and additional  guidance. Performed at [facility], [address] [place] [address] [place], [place] [address]   CBG monitoring, ED     Status: None   Collection Time: 02/07/22  9:49 PM  Result Value Ref Range   Glucose-Capillary 71 70 - 99 mg/dL    Comment: Glucose reference range applies only to samples taken after fasting for at least 8 hours.  Blood gas, venous     Status: Abnormal   Collection Time: 02/07/22 10:02 PM  Result Value Ref Range   FIO2 21.00 %   pH, Ven 7.43 7.25 - 7.43   pCO2, Ven 37 (L) 44 - 60 mmHg   pO2, Ven 53 (H) 32 - 45 mmHg   Bicarbonate 24.6 20.0 - 28.0 mmol/L   Acid-Base Excess 0.3 0.0 - 2.0 mmol/L   O2 Saturation 88.2 %   Patient temperature 36.5    Collection site RIGHT ANTECUBITAL    Drawn by [id number]     Comment: Performed at [facility], [address] [place], [place] [address]  Glucose, capillary     Status: Abnormal   Collection Time: 02/08/22 12:09 AM  Result Value Ref Range   Glucose-Capillary 186 (H) 70 - 99 mg/dL    Comment: Glucose reference range applies only to  samples taken after fasting for at least 8 hours.  Basic metabolic panel     Status: Abnormal   Collection  Time: 02/08/22  5:42 AM  Result Value Ref Range   Sodium 139 135 - 145 mmol/L   Potassium 3.8 3.5 - 5.1 mmol/L   Chloride 109 98 - 111 mmol/L   CO2 24 22 - 32 mmol/L   Glucose, Bld 154 (H) 70 - 99 mg/dL    Comment: Glucose reference range applies only to samples taken after fasting for at least 8 hours.   BUN 20 6 - 20 mg/dL   Creatinine, Ser 4.25 0.44 - 1.00 mg/dL   Calcium 8.5 (L) 8.9 - 10.3 mg/dL   GFR, Estimated >95 >63 mL/min    Comment: (NOTE) Calculated using the CKD-EPI Creatinine Equation (2021)    Anion gap 6 5 - 15    Comment: Performed at [facility], [address], [place], [place] [address]  Procalcitonin - Baseline     Status: None   Collection Time: 02/08/22  5:42 AM  Result Value Ref Range   Procalcitonin <0.10 ng/mL    Comment:        Interpretation: PCT (Procalcitonin) <= 0.5 ng/mL: Systemic infection (sepsis) is not likely. Local bacterial infection is possible. (NOTE)       Sepsis PCT Algorithm           Lower Respiratory Tract                                      Infection PCT Algorithm    ----------------------------     ----------------------------         PCT < 0.25 ng/mL                PCT < 0.10 ng/mL          Strongly encourage             Strongly discourage   discontinuation of antibiotics    initiation of antibiotics    ----------------------------     -----------------------------       PCT 0.25 - 0.50 ng/mL            PCT 0.10 - 0.25 ng/mL               OR       >80% decrease in PCT            Discourage initiation of                                            antibiotics      Encourage discontinuation           of antibiotics    ----------------------------     -----------------------------         PCT >= 0.50 ng/mL              PCT 0.26 - 0.50 ng/mL               AND        <80% decrease in PCT             Encourage initiation  of                                             antibiotics  Encourage continuation           of antibiotics    ----------------------------     -----------------------------        PCT >= 0.50 ng/mL                  PCT > 0.50 ng/mL               AND         increase in PCT                  Strongly encourage                                      initiation of antibiotics    Strongly encourage escalation           of antibiotics                                     -----------------------------                                           PCT <= 0.25 ng/mL                                                 OR                                        > 80% decrease in PCT                                      Discontinue / Do not initiate                                             antibiotics  Performed at [facility], [address] [place], [place] [address]   Glucose, capillary     Status: Abnormal   Collection Time: 02/08/22  6:00 AM  Result Value Ref Range   Glucose-Capillary 135 (H) 70 - 99 mg/dL    Comment: Glucose reference range applies only to samples taken after fasting for at least 8 hours.  Glucose, capillary     Status: Abnormal   Collection Time: 02/08/22 12:04 PM  Result Value Ref Range   Glucose-Capillary 288 (H) 70 - 99 mg/dL    Comment: Glucose reference range applies only to samples taken after fasting for at least 8 hours.   Comment 1 Notify RN    Comment 2 Document in Chart   ECHOCARDIOGRAM COMPLETE     Status: None   Collection Time: 02/08/22 12:54 PM  Result Value Ref Range   Weight 3,410.96 oz   Height 64 in   BP 132/49 mmHg   MV VTI 1.25 cm2   Area-P 1/2 2.40 cm2   S' Lateral  2.30 cm   AR max vel 1.66 cm2   AV Area mean vel 1.86 cm2   AV Area VTI 1.77 cm2   AV Peak grad 9.4 mmHg   Ao pk vel 1.53 m/s   AV Mean grad 5.0 mmHg  Glucose, capillary     Status: Abnormal   Collection Time: 02/08/22  4:14 PM  Result Value Ref Range   Glucose-Capillary  338 (H) 70 - 99 mg/dL    Comment: Glucose reference range applies only to samples taken after fasting for at least 8 hours.  Glucose, capillary     Status: Abnormal   Collection Time: 02/08/22  9:41 PM  Result Value Ref Range   Glucose-Capillary 340 (H) 70 - 99 mg/dL    Comment: Glucose reference range applies only to samples taken after fasting for at least 8 hours.  CBC     Status: Abnormal   Collection Time: 02/09/22  4:45 AM  Result Value Ref Range   WBC 6.2 4.0 - 10.5 K/uL   RBC 3.77 (L) 3.87 - 5.11 MIL/uL   Hemoglobin 12.4 12.0 - 15.0 g/dL   HCT 36.9 36.0 - 46.0 %   MCV 97.9 80.0 - 100.0 fL   MCH 32.9 26.0 - 34.0 pg   MCHC 33.6 30.0 - 36.0 g/dL   RDW 13.2 11.5 - 15.5 %   Platelets 177 150 - 400 K/uL   nRBC 0.0 0.0 - 0.2 %    Comment: Performed at [facility], [address] [place], [place] [address]  Procalcitonin     Status: None   Collection Time: 02/09/22  4:45 AM  Result Value Ref Range   Procalcitonin <0.10 ng/mL    Comment:        Interpretation: PCT (Procalcitonin) <= 0.5 ng/mL: Systemic infection (sepsis) is not likely. Local bacterial infection is possible. (NOTE)       Sepsis PCT Algorithm           Lower Respiratory Tract                                      Infection PCT Algorithm    ----------------------------     ----------------------------         PCT < 0.25 ng/mL                PCT < 0.10 ng/mL          Strongly encourage             Strongly discourage   discontinuation of antibiotics    initiation of antibiotics    ----------------------------     -----------------------------       PCT 0.25 - 0.50 ng/mL            PCT 0.10 - 0.25 ng/mL               OR       >80% decrease in PCT            Discourage initiation of                                            antibiotics      Encourage discontinuation           of antibiotics    ----------------------------     -----------------------------  PCT >= 0.50 ng/mL              PCT 0.26 - 0.50  ng/mL               AND        <80% decrease in PCT             Encourage initiation of                                             antibiotics       Encourage continuation           of antibiotics    ----------------------------     -----------------------------        PCT >= 0.50 ng/mL                  PCT > 0.50 ng/mL               AND         increase in PCT                  Strongly encourage                                      initiation of antibiotics    Strongly encourage escalation           of antibiotics                                     -----------------------------                                           PCT <= 0.25 ng/mL                                                 OR                                        > 80% decrease in PCT                                      Discontinue / Do not initiate                                             antibiotics  Performed at [facility], [address] [place], [place] [address]   Glucose, capillary     Status: Abnormal   Collection Time: 02/09/22  7:20 AM  Result Value Ref Range   Glucose-Capillary 206 (H) 70 - 99 mg/dL    Comment: Glucose reference range applies only to samples taken after fasting for at least 8 hours.  Glucose, capillary     Status: Abnormal   Collection Time: 02/09/22 11:29 AM  Result Value Ref Range   Glucose-Capillary 146 (H) 70 - 99 mg/dL    Comment: Glucose reference range applies only to samples taken after fasting for at least 8 hours.  Glucose, capillary     Status: Abnormal   Collection Time: 02/09/22  4:17 PM  Result Value Ref Range   Glucose-Capillary 253 (H) 70 - 99 mg/dL    Comment: Glucose reference range applies only to samples taken after fasting for at least 8 hours.  Glucose, capillary     Status: Abnormal   Collection Time: 02/09/22  9:58 PM  Result Value Ref Range   Glucose-Capillary 251 (H) 70 - 99 mg/dL    Comment: Glucose reference range applies only to samples taken after  fasting for at least 8 hours.  Glucose, capillary     Status: None   Collection Time: 02/10/22  7:35 AM  Result Value Ref Range   Glucose-Capillary 83 70 - 99 mg/dL    Comment: Glucose reference range applies only to samples taken after fasting for at least 8 hours.  Glucose, capillary     Status: Abnormal   Collection Time: 02/10/22 10:58 AM  Result Value Ref Range   Glucose-Capillary 204 (H) 70 - 99 mg/dL    Comment: Glucose reference range applies only to samples taken after fasting for at least 8 hours.  Glucose, capillary     Status: Abnormal   Collection Time: 02/10/22  4:34 PM  Result Value Ref Range   Glucose-Capillary 210 (H) 70 - 99 mg/dL    Comment: Glucose reference range applies only to samples taken after fasting for at least 8 hours.  Glucose, capillary     Status: Abnormal   Collection Time: 02/10/22  9:38 PM  Result Value Ref Range   Glucose-Capillary 273 (H) 70 - 99 mg/dL    Comment: Glucose reference range applies only to samples taken after fasting for at least 8 hours.  Basic metabolic panel     Status: Abnormal   Collection Time: 02/11/22  4:42 AM  Result Value Ref Range   Sodium 140 135 - 145 mmol/L   Potassium 3.8 3.5 - 5.1 mmol/L   Chloride 106 98 - 111 mmol/L   CO2 26 22 - 32 mmol/L   Glucose, Bld 133 (H) 70 - 99 mg/dL    Comment: Glucose reference range applies only to samples taken after fasting for at least 8 hours.   BUN 22 (H) 6 - 20 mg/dL   Creatinine, Ser 5.25 0.44 - 1.00 mg/dL   Calcium 8.6 (L) 8.9 - 10.3 mg/dL   GFR, Estimated >36 >48 mL/min    Comment: (NOTE) Calculated using the CKD-EPI Creatinine Equation (2021)    Anion gap 8 5 - 15    Comment: Performed at [facility], [address] [place], [place] [address]  Magnesium     Status: None   Collection Time: 02/11/22  4:42 AM  Result Value Ref Range   Magnesium 2.0 1.7 - 2.4 mg/dL    Comment: Performed at [facility], [address] [place], [place] [address]  Brain natriuretic  peptide     Status: Abnormal   Collection Time: 02/11/22  4:42 AM  Result Value Ref Range   B Natriuretic Peptide 144.0 (H) 0.0 - 100.0 pg/mL    Comment: Performed at [facility], [address], [place], [place] [address]  Glucose, capillary  Status: Abnormal   Collection Time: 02/11/22  7:14 AM  Result Value Ref Range   Glucose-Capillary 101 (H) 70 - 99 mg/dL    Comment: Glucose reference range applies only to samples taken after fasting for at least 8 hours.  Glucose, capillary     Status: Abnormal   Collection Time: 02/11/22 11:03 AM  Result Value Ref Range   Glucose-Capillary 102 (H) 70 - 99 mg/dL    Comment: Glucose reference range applies only to samples taken after fasting for at least 8 hours.  Glucose, capillary     Status: Abnormal   Collection Time: 02/11/22  4:13 PM  Result Value Ref Range   Glucose-Capillary 198 (H) 70 - 99 mg/dL    Comment: Glucose reference range applies only to samples taken after fasting for at least 8 hours.  Glucose, capillary     Status: Abnormal   Collection Time: 02/12/22  7:04 AM  Result Value Ref Range   Glucose-Capillary 142 (H) 70 - 99 mg/dL    Comment: Glucose reference range applies only to samples taken after fasting for at least 8 hours.  Glucose, capillary     Status: Abnormal   Collection Time: 02/12/22 11:15 AM  Result Value Ref Range   Glucose-Capillary 184 (H) 70 - 99 mg/dL    Comment: Glucose reference range applies only to samples taken after fasting for at least 8 hours.  Glucose, capillary     Status: Abnormal   Collection Time: 02/12/22  4:28 PM  Result Value Ref Range   Glucose-Capillary 227 (H) 70 - 99 mg/dL    Comment: Glucose reference range applies only to samples taken after fasting for at least 8 hours.  Glucose, capillary     Status: Abnormal   Collection Time: 02/12/22  9:17 PM  Result Value Ref Range   Glucose-Capillary 241 (H) 70 - 99 mg/dL    Comment: Glucose reference range applies only to samples taken  after fasting for at least 8 hours.  Basic metabolic panel     Status: Abnormal   Collection Time: 02/13/22  4:26 AM  Result Value Ref Range   Sodium 140 135 - 145 mmol/L   Potassium 4.0 3.5 - 5.1 mmol/L   Chloride 105 98 - 111 mmol/L   CO2 30 22 - 32 mmol/L   Glucose, Bld 165 (H) 70 - 99 mg/dL    Comment: Glucose reference range applies only to samples taken after fasting for at least 8 hours.   BUN 18 6 - 20 mg/dL   Creatinine, Ser 0.70 0.44 - 1.00 mg/dL   Calcium 9.0 8.9 - 10.3 mg/dL   GFR, Estimated >60 >60 mL/min    Comment: (NOTE) Calculated using the CKD-EPI Creatinine Equation (2021)    Anion gap 5 5 - 15    Comment: Performed at [facility], [address], [place], [place] [address]  Glucose, capillary     Status: Abnormal   Collection Time: 02/13/22  7:11 AM  Result Value Ref Range   Glucose-Capillary 132 (H) 70 - 99 mg/dL    Comment: Glucose reference range applies only to samples taken after fasting for at least 8 hours.  Glucose, capillary     Status: Abnormal   Collection Time: 02/13/22 11:10 AM  Result Value Ref Range   Glucose-Capillary 179 (H) 70 - 99 mg/dL    Comment: Glucose reference range applies only to samples taken after fasting for at least 8 hours.  Glucose, capillary  Status: Abnormal   Collection Time: 02/13/22  4:09 PM  Result Value Ref Range   Glucose-Capillary 270 (H) 70 - 99 mg/dL    Comment: Glucose reference range applies only to samples taken after fasting for at least 8 hours.  Glucose, capillary     Status: Abnormal   Collection Time: 02/13/22  9:06 PM  Result Value Ref Range   Glucose-Capillary 271 (H) 70 - 99 mg/dL    Comment: Glucose reference range applies only to samples taken after fasting for at least 8 hours.   Comment 1 Notify RN    Comment 2 Document in Chart   CBC     Status: Abnormal   Collection Time: 02/14/22  4:24 AM  Result Value Ref Range   WBC 7.0 4.0 - 10.5 K/uL   RBC 3.78 (L) 3.87 - 5.11 MIL/uL   Hemoglobin  11.9 (L) 12.0 - 15.0 g/dL   HCT 36.8 36.0 - 46.0 %   MCV 97.4 80.0 - 100.0 fL   MCH 31.5 26.0 - 34.0 pg   MCHC 32.3 30.0 - 36.0 g/dL   RDW 13.9 11.5 - 15.5 %   Platelets 147 (L) 150 - 400 K/uL   nRBC 0.0 0.0 - 0.2 %    Comment: Performed at [facility], [address], [place], [place] [address]  Basic metabolic panel     Status: Abnormal   Collection Time: 02/14/22  4:24 AM  Result Value Ref Range   Sodium 140 135 - 145 mmol/L   Potassium 4.0 3.5 - 5.1 mmol/L   Chloride 105 98 - 111 mmol/L   CO2 29 22 - 32 mmol/L   Glucose, Bld 125 (H) 70 - 99 mg/dL    Comment: Glucose reference range applies only to samples taken after fasting for at least 8 hours.   BUN 18 6 - 20 mg/dL   Creatinine, Ser 0.65 0.44 - 1.00 mg/dL   Calcium 8.5 (L) 8.9 - 10.3 mg/dL   GFR, Estimated >60 >60 mL/min    Comment: (NOTE) Calculated using the CKD-EPI Creatinine Equation (2021)    Anion gap 6 5 - 15    Comment: Performed at [facility], [address], [place], [place] [address]  Magnesium     Status: None   Collection Time: 02/14/22  4:24 AM  Result Value Ref Range   Magnesium 2.0 1.7 - 2.4 mg/dL    Comment: Performed at [facility], [address] [place], [place] [address]  Glucose, capillary     Status: Abnormal   Collection Time: 02/14/22  7:10 AM  Result Value Ref Range   Glucose-Capillary 137 (H) 70 - 99 mg/dL    Comment: Glucose reference range applies only to samples taken after fasting for at least 8 hours.  Glucose, capillary     Status: Abnormal   Collection Time: 02/14/22 11:28 AM  Result Value Ref Range   Glucose-Capillary 189 (H) 70 - 99 mg/dL    Comment: Glucose reference range applies only to samples taken after fasting for at least 8 hours.  CBC     Status: Abnormal   Collection Time: 02/16/22 10:31 AM  Result Value Ref Range   WBC 8.8 4.0 - 10.5 K/uL   RBC 4.07 3.87 - 5.11 MIL/uL   Hemoglobin 13.0 12.0 - 15.0 g/dL   HCT 40.0 36.0 - 46.0 %   MCV 98.3 80.0 - 100.0 fL   MCH  31.9 26.0 - 34.0 pg   MCHC 32.5 30.0 - 36.0  g/dL   RDW 14.3 11.5 - 15.5 %   Platelets 145 (L) 150 - 400 K/uL   nRBC 0.0 0.0 - 0.2 %    Comment: Performed at [facility], [address] [place], [place] [address]  Basic metabolic panel     Status: Abnormal   Collection Time: 02/16/22 10:31 AM  Result Value Ref Range   Sodium 141 135 - 145 mmol/L   Potassium 4.8 3.5 - 5.1 mmol/L   Chloride 110 98 - 111 mmol/L   CO2 26 22 - 32 mmol/L   Glucose, Bld 186 (H) 70 - 99 mg/dL    Comment: Glucose reference range applies only to samples taken after fasting for at least 8 hours.   BUN 25 (H) 6 - 20 mg/dL   Creatinine, Ser 0.68 0.44 - 1.00 mg/dL   Calcium 8.8 (L) 8.9 - 10.3 mg/dL   GFR, Estimated >60 >60 mL/min    Comment: (NOTE) Calculated using the CKD-EPI Creatinine Equation (2021)    Anion gap 5 5 - 15    Comment: Performed at [facility], [address], [place], [place] [address]  HgB A1c     Status: Abnormal   Collection Time: 02/24/22 10:10 AM  Result Value Ref Range   Hemoglobin A1C     HbA1c POC (<> result, manual entry)     HbA1c, POC (prediabetic range)     HbA1c, POC (controlled diabetic range) 7.8 (A) 0.0 - 7.0 %     Assessment & Plan:     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 Neglia presents with worsening glycemic profile compared to last visit.  Her freestyle libre device was downloaded.  AGP report shows 38% time range, 29% level 1 hyperglycemia, 33% level 2 hyperglycemia.  She does not have any hypoglycemia.  Her point-of-care A1c was 7.8%.    -her diabetes is complicated by CVA, CHF, history of open heart surgery for ASD, obesity/sedentary life, history of chronic smoking with bronchiectasis and Satcha Storlie remains at a high risk for more acute and chronic complications which include CAD, CVA, CKD, retinopathy, and neuropathy. These are all discussed in detail with the patient.  - I have counseled her on diet management and weight loss, by adopting a  carbohydrate restricted/protein rich diet.  - she acknowledges that there is a room for improvement in her food and drink choices. - Suggestion is made for her to avoid simple carbohydrates  from her diet including Cakes, Sweet Desserts, Ice Cream, Soda (diet and regular), Sweet Tea, Candies, Chips, Cookies, Store Bought Juices, Alcohol , Artificial Sweeteners,  Coffee Creamer, and "Sugar-free" Products, Lemonade. This will help patient to have more stable blood glucose profile and potentially avoid unintended weight gain.  The following Lifestyle Medicine recommendations according to [facility]) were discussed and and offered to patient and she  agrees to start the journey:  A. Whole Foods, Plant-Based Nutrition comprising of fruits and vegetables, plant-based proteins, whole-grain carbohydrates was discussed in detail with the patient.   A list for source of those nutrients were also provided to the patient.  Patient will use only water or unsweetened tea for hydration. B.  The need to stay away from risky substances including alcohol, smoking; obtaining 7 to 9 hours of restorative sleep, at least 150 minutes of moderate intensity exercise weekly, the importance of healthy social connections,  and stress management techniques were discussed. C.  A full color page of  Calorie density of various food groups per pound showing examples of each food groups was provided to the patient.   - she is advised to stick to a routine mealtimes to eat 3 meals  a day and avoid unnecessary snacks ( to snack only to correct hypoglycemia).   - she will be scheduled with Jearld Fenton, RDN, CDE for individualized diabetes education.  - I have approached her with the following individualized plan to manage diabetes and patient agrees:   -In light of her presentation with increasing glycemic profile, she will need a higher dose of insulin.  She will not need prandial insulin.  I  discussed and increase her Lantus to 80 units nightly, associated with continuous monitoring of blood glucose using her CGM.      She is advised to call clinic for blood glucose readings above 200 mg per DL x3, or less than 70 mg per DL.   - Patient is warned not to take insulin without proper monitoring per orders. - She has tolerated Trulicity 1.5 mg, discussed and increase Trulicity to 3 mg subcutaneously weekly.   -In light of her recent diagnosed with CHF, she would have benefited from low-dose SGLT2 inhibitors.  She was given [place] 5 mg p.o. daily at breakfast during her last visit.  However this medication was discontinued by her cardiologist.  She will stay off of it until further testing.   -She is advised to continue metformin 500 mg p.o. twice daily.  - Patient specific target  A1c;  LDL, HDL, Triglycerides,  were discussed in detail.  2.  BP/HTN:  -Her blood pressure is not controlled to target.  She advised to continue her current blood pressure medications including hydralazine 25 mg 3 times daily.    3) Lipids/HPL: Her most recent lipid panel showed LDL of 80.  She is advised to continue atorvastatin 10 mg p.o. nightly.  Side effects and precautions discussed with her   4)  Weight/Diet: Her BMI is 88.82--[id number] complicating her diabetes care.  She is a candidate for modest weight loss.   The above discussed dietary plan will help achieve weight loss. -She is working with her dietitian.     5 hypothyroidism-long-standing, etiology unclear, she has multiple family members with thyroid dysfunctions. Her previsit thyroid function tests are consistent with appropriate replacement.  She is advised to continue levothyroxine 112 mcg p.o. daily before breakfast.    - We discussed about the correct intake of her thyroid hormone, on empty stomach at fasting, with water, separated by at least 30 minutes from breakfast and other medications,  and separated by more than 4 hours from  calcium, iron, multivitamins, acid reflux medications (PPIs). -Patient is made aware of the fact that thyroid hormone replacement is needed for life, dose to be adjusted by periodic monitoring of thyroid function tests.   6) vitamin D deficiency: - She is currently on  vitamin D supplements including vitamin D3 5000 units daily.     7) Chronic Care/Health Maintenance:  -she  is on  Statin medications and  is encouraged to continue to follow up with Ophthalmology, Dentist,  Podiatrist at least yearly or according to recommendations, and advised to  stay away from smoking. I have recommended yearly flu vaccine and pneumonia vaccination at least every 5 years;  and  sleep for at least 7 hours a day.  - I advised patient to maintain close follow up with [name], MD for primary care needs.  I spent 44 minutes in the care of the patient today including review of labs from [place], Lipids, Thyroid Function, Hematology (current and previous including abstractions from other facilities); face-to-face time discussing  her blood glucose readings/logs, discussing hypoglycemia and hyperglycemia episodes and symptoms, medications doses, her options of short and long term treatment based on the latest standards of care / guidelines;  discussion about incorporating lifestyle medicine;  and documenting the encounter.    Please refer to Patient Instructions for Blood Glucose Monitoring and Insulin/Medications Dosing Guide"  in media tab for additional information. Please  also refer to " Patient Self Inventory" in the Media  tab for reviewed elements of pertinent patient history.  Cambry Spampinato participated in the discussions, expressed understanding, and voiced agreement with the above plans.  All questions were answered to her satisfaction. she is encouraged to contact clinic should she have any questions or concerns prior to her return visit.     Follow up plan: - Return in about 3 months (around  05/27/2022) for Bring Meter/CGM Device/Logs- A1c in Office.  Glade Lloyd, MD [facility] [address] [place], [place] [address] Phone: [phone]  Fax: [phone]    02/24/2022, 4:34 PM  This note was partially dictated with voice recognition software. Similar sounding words can be transcribed inadequately or may not  be corrected upon review.

## 2022-02-24 NOTE — Progress Notes (Signed)
Medical Nutrition Therapy  Appointment Start time:  16 Appointment End time: [place]  Follow up DM Type 2 Just got out of the hospital.  Had some issues with her heart. Schedule to get a pacemaker done due to her heart stopping. Had passed out at the gym in 12/20/21. Just had a accident when her heart stopped a week or so ago.  . A1C 7.4%. Saw [name] today.   Has been getting some meals sent to her from her insurance company. THey are cardiac and diabetic friendly. She is enjoying them.  Very tired from being in [employer].   Anthropometrics  Wt Readings from Last 3 Encounters:  02/24/22 214 lb 12.8 oz (97.4 kg)  02/16/22 220 lb 12.8 oz (100.2 kg)  02/14/22 220 lb (99.8 kg)   Ht Readings from Last 3 Encounters:  02/24/22 '5\' 3"'$  (1.6 m)  02/16/22 '5\' 3"'$  (1.6 m)  02/07/22 '5\' 4"'$  (1.626 m)   There is no height or weight on file to calculate BMI. '@BMIFA'$ @ Facility age limit for growth percentiles is 20 years. Facility age limit for growth percentiles is 20 years.   FBS: 128 mg/dl.  Range 124-128 mg/dl..  Clinical Medical Hx: CHF and DM,  Medications: Lantus 60 units a day now, Trulicity weekly.  Labs:  Lab Results  Component Value Date   HGBA1C 7.8 (A) 02/24/2022      Latest Ref Rng & Units 02/16/2022   10:31 AM 02/14/2022    4:24 AM 02/13/2022    4:26 AM  CMP  Glucose 70 - 99 mg/dL 186   125   165    BUN 6 - 20 mg/dL '25   18   18    '$ Creatinine 0.44 - 1.00 mg/dL 0.68   0.65   0.70    Sodium 135 - 145 mmol/L 141   140   140    Potassium 3.5 - 5.1 mmol/L 4.8   4.0   4.0    Chloride 98 - 111 mmol/L 110   105   105    CO2 22 - 32 mmol/L '26   29   30    '$ Calcium 8.9 - 10.3 mg/dL 8.8   8.5   9.0     Notable Signs/Symptoms:    Lifestyle & Dietary  Lives with her sister.   Estimated daily fluid intake:  64 oz   Sleep: varies  Stress / self-care: stress eater Current average weekly physical activity: ADL  B) Banana L) Leftover or lowcarb wraps with  chicken salad. D) Hamburger with sweet potato, broccoli   Estimated Energy Needs Calories: 1200 Carbohydrate: 135g Protein: 90g Fat: 33g   NUTRITION DIAGNOSIS  NB-1.1 Food and nutrition-related knowledge deficit As related to Diabetes Type 2.  As evidenced by A1C 7.7%.   NUTRITION INTERVENTION  Nutrition education (E-1) on the following topics:  Lifestyle Medicine - Whole Food, Plant Predominant Nutrition is highly recommended: Eat Plenty of vegetables, Mushrooms, fruits, Legumes, Whole Grains, Nuts, seeds in lieu of processed meats, processed snacks/pastries red meat, poultry, eggs.    -It is better to avoid simple carbohydrates including: Cakes, Sweet Desserts, Ice Cream, Soda (diet and regular), Sweet Tea, Candies, Chips, Cookies, Store Bought Juices, Alcohol in Excess of  1-2 drinks a day, Lemonade,  Artificial Sweeteners, Doughnuts, Coffee Creamers, "Sugar-free" Products, etc, etc.  This is not a complete list.....  Exercise: If you are able: 30 -60 minutes a day ,4 days a week, or 150 minutes  a week.  The longer the better.  Combine stretch, strength, and aerobic activities.  If you were told in the past that you have high risk for cardiovascular diseases, you may seek evaluation by your heart doctor prior to initiating moderate to intense exercise programs.  Handouts Provided Include  .Nutrition Lifestyle handout  Learning Style & Readiness for Change Teaching method utilized: Visual & Auditory  Demonstrated degree of understanding via: Teach Back  Barriers to learning/adherence to lifestyle change: none  Goals Established by Pt Goals  Take care of yourself. Rest and get plenty of sleep Drink water and focus on whole plant based foods.  MONITORING & EVALUATION Dietary intake, weekly physical activity, and blood sugars in 3 month. Increase Lantus to 60 units at night per [name]..  Next Steps  Get back on track with meal planning and eating meals on time-more plant  based foods.

## 2022-02-25 ENCOUNTER — Ambulatory Visit (INDEPENDENT_AMBULATORY_CARE_PROVIDER_SITE_OTHER): Payer: Medicare Other | Admitting: Clinical

## 2022-02-25 DIAGNOSIS — F4310 Post-traumatic stress disorder, unspecified: Secondary | ICD-10-CM
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
## 2022-02-27 DIAGNOSIS — E1165 Type 2 diabetes mellitus with hyperglycemia: Secondary | ICD-10-CM | POA: Diagnosis not present

## 2022-02-28 ENCOUNTER — Telehealth: Payer: Self-pay | Admitting: "Endocrinology

## 2022-02-28 ENCOUNTER — Telehealth: Payer: Self-pay | Admitting: Cardiology

## 2022-02-28 DIAGNOSIS — I10 Essential (primary) hypertension: Secondary | ICD-10-CM | POA: Diagnosis not present

## 2022-02-28 DIAGNOSIS — R55 Syncope and collapse: Secondary | ICD-10-CM | POA: Diagnosis not present

## 2022-02-28 NOTE — Telephone Encounter (Signed)
Data uploaded and printed of pt's BG readings. Results given to Dr.Nida.

## 2022-02-28 NOTE — Telephone Encounter (Signed)
Pt c/o medication issue:  1. Name of Medication: losartan (COZAAR) 25 MG tablet  2. How are you currently taking this medication (dosage and times per day)? 25 MG   3. Are you having a reaction (difficulty breathing--STAT)? No  4. What is your medication issue? Pt states she was told to take a higher dose of med. She was prescribed the 25 mg, but the internal medicine Dr she saw today wants her to go to 50 mg, but she is unsure of this because of the upcoming procedure she has with Dr Lovena Le on 5/25. Please advise.

## 2022-02-28 NOTE — Telephone Encounter (Signed)
Discussed with pt, understanding voiced. 

## 2022-02-28 NOTE — Telephone Encounter (Signed)
Pt states since she was started on Lantus, her readings have been dropping at night and middle of night. She shares her readings and would like for you to look and see if she needs to make any changes.

## 2022-03-01 DIAGNOSIS — I5032 Chronic diastolic (congestive) heart failure: Secondary | ICD-10-CM | POA: Diagnosis not present

## 2022-03-01 DIAGNOSIS — J4551 Severe persistent asthma with (acute) exacerbation: Secondary | ICD-10-CM | POA: Diagnosis not present

## 2022-03-01 DIAGNOSIS — E1142 Type 2 diabetes mellitus with diabetic polyneuropathy: Secondary | ICD-10-CM | POA: Diagnosis not present

## 2022-03-01 DIAGNOSIS — J449 Chronic obstructive pulmonary disease, unspecified: Secondary | ICD-10-CM | POA: Diagnosis not present

## 2022-03-01 DIAGNOSIS — I4821 Permanent atrial fibrillation: Secondary | ICD-10-CM | POA: Diagnosis not present

## 2022-03-01 DIAGNOSIS — I110 Hypertensive heart disease with heart failure: Secondary | ICD-10-CM | POA: Diagnosis not present

## 2022-03-01 NOTE — Telephone Encounter (Signed)
Spoke with pt and notified that it is ok to take prescribed dose of Losartan by PCP.

## 2022-03-02 NOTE — Pre-Procedure Instructions (Signed)
Instructed patient on the following items: Arrival time 1130 Nothing to eat or drink after midnight No meds AM of procedure Responsible person to drive you home and stay with you for 24 hrs Wash with special soap night before and morning of procedure If on anti-coagulant drug instructions Eliquis- 5/23

## 2022-03-03 ENCOUNTER — Ambulatory Visit (HOSPITAL_COMMUNITY)
Admission: RE | Admit: 2022-03-03 | Discharge: 2022-03-03 | Disposition: A | Discharge status: Home / Self Care | Payer: Medicare Other | Source: Ambulatory Visit | Attending: Internal Medicine | Admitting: Internal Medicine

## 2022-03-03 ENCOUNTER — Ambulatory Visit (HOSPITAL_COMMUNITY): Payer: Medicare Other

## 2022-03-03 ENCOUNTER — Encounter (HOSPITAL_COMMUNITY)
Admission: RE | Disposition: A | Discharge status: Home / Self Care | Payer: Medicare Other | Source: Ambulatory Visit | Attending: Internal Medicine

## 2022-03-03 DIAGNOSIS — D801 Nonfamilial hypogammaglobulinemia: Secondary | ICD-10-CM | POA: Diagnosis not present

## 2022-03-03 DIAGNOSIS — Z950 Presence of cardiac pacemaker: Secondary | ICD-10-CM

## 2022-03-03 DIAGNOSIS — Z7985 Long-term (current) use of injectable non-insulin antidiabetic drugs: Secondary | ICD-10-CM | POA: Insufficient documentation

## 2022-03-03 DIAGNOSIS — Z8616 Personal history of COVID-19: Secondary | ICD-10-CM | POA: Insufficient documentation

## 2022-03-03 DIAGNOSIS — Z7984 Long term (current) use of oral hypoglycemic drugs: Secondary | ICD-10-CM | POA: Insufficient documentation

## 2022-03-03 DIAGNOSIS — I442 Atrioventricular block, complete: Secondary | ICD-10-CM | POA: Insufficient documentation

## 2022-03-03 DIAGNOSIS — I4891 Unspecified atrial fibrillation: Secondary | ICD-10-CM | POA: Diagnosis not present

## 2022-03-03 DIAGNOSIS — F458 Other somatoform disorders: Secondary | ICD-10-CM | POA: Diagnosis not present

## 2022-03-03 DIAGNOSIS — I509 Heart failure, unspecified: Secondary | ICD-10-CM | POA: Diagnosis not present

## 2022-03-03 DIAGNOSIS — F319 Bipolar disorder, unspecified: Secondary | ICD-10-CM | POA: Diagnosis not present

## 2022-03-03 DIAGNOSIS — I495 Sick sinus syndrome: Secondary | ICD-10-CM | POA: Diagnosis not present

## 2022-03-03 DIAGNOSIS — Z794 Long term (current) use of insulin: Secondary | ICD-10-CM | POA: Insufficient documentation

## 2022-03-03 DIAGNOSIS — D839 Common variable immunodeficiency, unspecified: Secondary | ICD-10-CM | POA: Diagnosis not present

## 2022-03-03 DIAGNOSIS — E1140 Type 2 diabetes mellitus with diabetic neuropathy, unspecified: Secondary | ICD-10-CM | POA: Diagnosis not present

## 2022-03-03 HISTORY — PX: PACEMAKER IMPLANT: EP1218

## 2022-03-03 HISTORY — DX: Presence of cardiac pacemaker: Z95.0

## 2022-03-03 LAB — GLUCOSE, CAPILLARY
Glucose-Capillary: 100 mg/dL — ABNORMAL HIGH (ref 70–99)
Glucose-Capillary: 73 mg/dL (ref 70–99)

## 2022-03-03 IMAGING — DX DG CHEST 2V
2 series · 2 of 2 positions shown · non-contrast
Comparison: Chest x-ray [DATE]

CLINICAL DATA: Postoperative pacemaker

EXAM:
CHEST - 2 VIEW

[w chest pa]
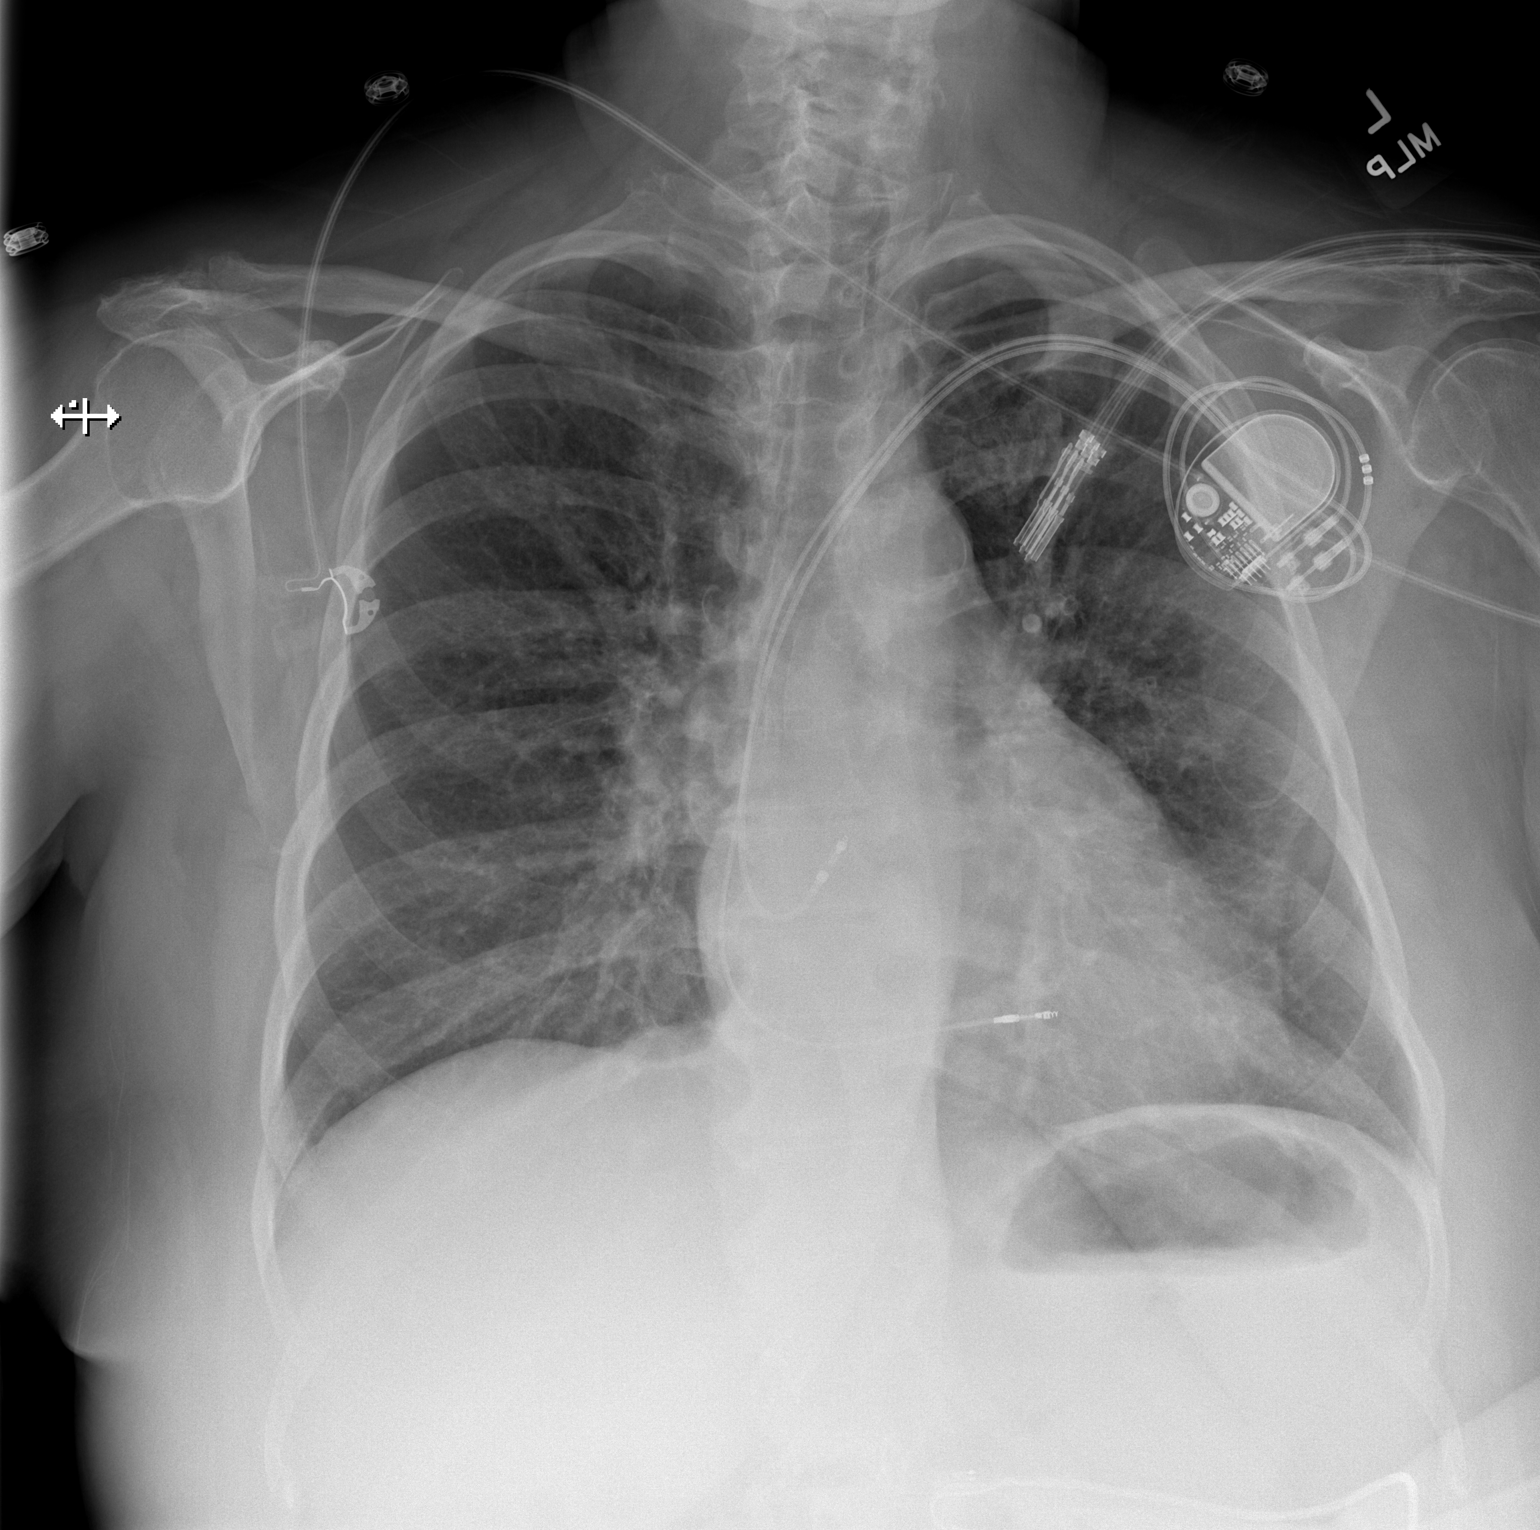

[w chest lat]
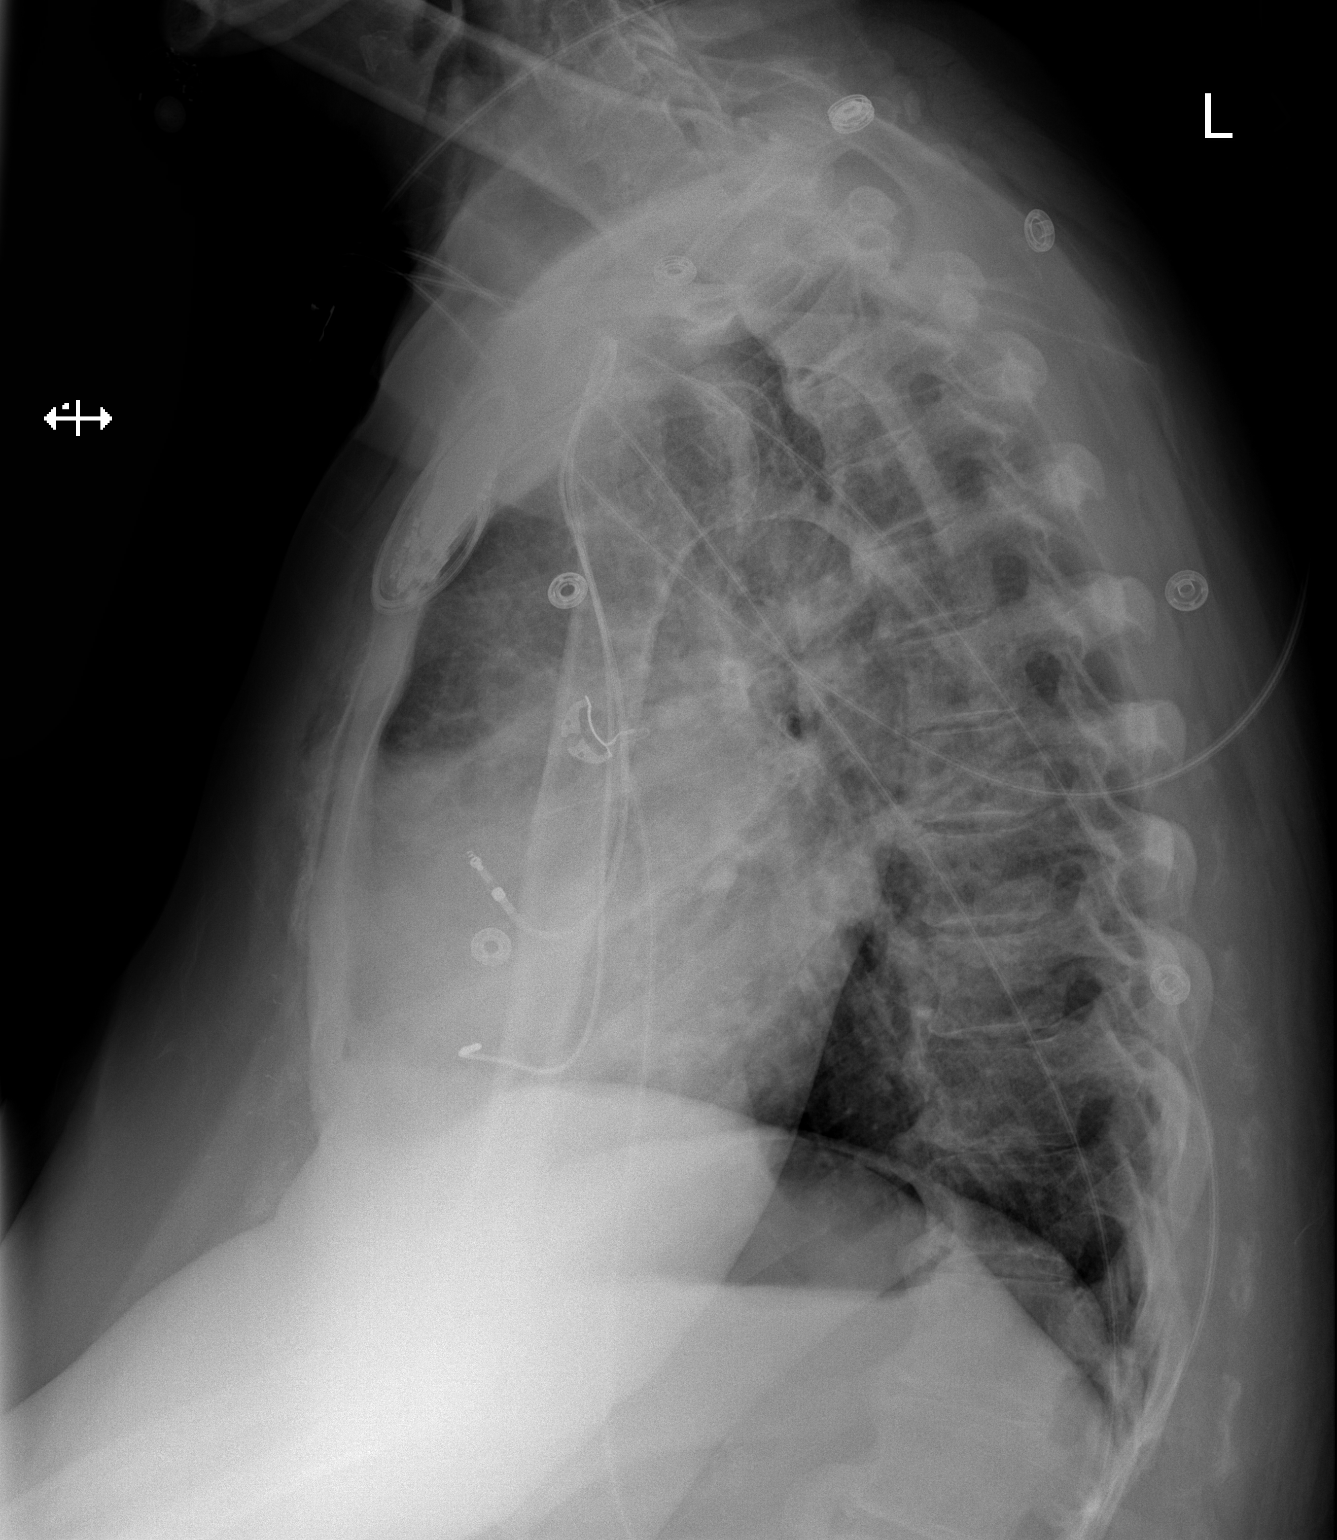

[2 of 2 positions shown; findings below may reference images not displayed]

FINDINGS: Left-sided pacemaker is present. Leads are seen overlying the right
atrium and right ventricle. The cardiomediastinal silhouette is
within normal limits. There is no focal lung infiltrate, pleural
effusion or pneumothorax. No acute fractures are seen.
IMPRESSION: 1. New pacemaker in place.
2. No other acute cardiopulmonary process.

## 2022-03-03 SURGERY — PACEMAKER IMPLANT

## 2022-03-03 MED ORDER — LIDOCAINE HCL (PF) 1 % IJ SOLN
INTRAMUSCULAR | Status: AC
  Filled 2022-03-03: qty 60

## 2022-03-03 MED ORDER — FENTANYL CITRATE (PF) 100 MCG/2ML IJ SOLN
INTRAMUSCULAR | Status: DC | PRN
  Administered 2022-03-03 (×3): 12.5 ug via INTRAVENOUS
  Administered 2022-03-03: 25 ug via INTRAVENOUS

## 2022-03-03 MED ORDER — SODIUM CHLORIDE 0.9 % IV SOLN: INTRAVENOUS | Status: DC

## 2022-03-03 MED ORDER — SODIUM CHLORIDE 0.9 % IV SOLN
INTRAVENOUS | Status: AC
  Filled 2022-03-03: qty 2

## 2022-03-03 MED ORDER — ONDANSETRON HCL 4 MG/2ML IJ SOLN: 4.0000 mg | Freq: Four times a day (QID) | INTRAMUSCULAR | Status: DC | PRN

## 2022-03-03 MED ORDER — CEFAZOLIN SODIUM-DEXTROSE 1-4 GM/50ML-% IV SOLN
1.0000 g | Freq: Once | INTRAVENOUS | Status: AC
  Administered 2022-03-03: 1 g via INTRAVENOUS
  Filled 2022-03-03: qty 50

## 2022-03-03 MED ORDER — DEXTROSE 50 % IV SOLN
25.0000 mL | Freq: Once | INTRAVENOUS | Status: AC
Start: 2022-03-03 — End: 2022-03-03
  Administered 2022-03-03: 25 mL via INTRAVENOUS

## 2022-03-03 MED ORDER — FENTANYL CITRATE (PF) 100 MCG/2ML IJ SOLN
INTRAMUSCULAR | Status: AC
  Filled 2022-03-03: qty 2

## 2022-03-03 MED ORDER — MIDAZOLAM HCL 5 MG/5ML IJ SOLN
INTRAMUSCULAR | Status: DC | PRN
  Administered 2022-03-03 (×2): 2 mg via INTRAVENOUS
  Administered 2022-03-03: 1 mg via INTRAVENOUS

## 2022-03-03 MED ORDER — POVIDONE-IODINE 10 % EX SWAB
2.0000 "application " | Freq: Once | CUTANEOUS | Status: AC
  Administered 2022-03-03: 2 via TOPICAL

## 2022-03-03 MED ORDER — SODIUM CHLORIDE 0.9 % IV SOLN
80.0000 mg | INTRAVENOUS | Status: AC
  Administered 2022-03-03: 80 mg

## 2022-03-03 MED ORDER — CEFAZOLIN SODIUM-DEXTROSE 2-4 GM/100ML-% IV SOLN
INTRAVENOUS | Status: AC
  Filled 2022-03-03: qty 100

## 2022-03-03 MED ORDER — CEFAZOLIN SODIUM-DEXTROSE 2-4 GM/100ML-% IV SOLN
2.0000 g | INTRAVENOUS | Status: AC
  Administered 2022-03-03: 2 g via INTRAVENOUS

## 2022-03-03 MED ORDER — CHLORHEXIDINE GLUCONATE 4 % EX LIQD
4.0000 | Freq: Once | CUTANEOUS | Status: DC
Start: 2022-03-03 — End: 2022-03-03

## 2022-03-03 MED ORDER — MIDAZOLAM HCL 5 MG/5ML IJ SOLN
INTRAMUSCULAR | Status: AC
  Filled 2022-03-03: qty 5

## 2022-03-03 MED ORDER — HEPARIN (PORCINE) IN NACL 1000-0.9 UT/500ML-% IV SOLN
INTRAVENOUS | Status: AC
Start: 2022-03-03 — End: ?
  Filled 2022-03-03: qty 500

## 2022-03-03 MED ORDER — LIDOCAINE HCL (PF) 1 % IJ SOLN
INTRAMUSCULAR | Status: DC | PRN
  Administered 2022-03-03: 60 mg

## 2022-03-03 MED ORDER — DEXTROSE 50 % IV SOLN
INTRAVENOUS | Status: AC
  Filled 2022-03-03: qty 50

## 2022-03-03 MED ORDER — HEPARIN (PORCINE) IN NACL 1000-0.9 UT/500ML-% IV SOLN
INTRAVENOUS | Status: DC | PRN
  Administered 2022-03-03: 500 mL

## 2022-03-03 MED ORDER — ACETAMINOPHEN 325 MG PO TABS
325.0000 mg | ORAL_TABLET | ORAL | Status: DC | PRN
  Administered 2022-03-03: 650 mg via ORAL
  Filled 2022-03-03: qty 2

## 2022-03-03 SURGICAL SUPPLY — 13 items
CABLE SURGICAL S-101-97-12 (CABLE) ×2 IMPLANT
CATH CPS LOCATOR 3D LG (CATHETERS) ×1 IMPLANT
CPS IMPLANT KIT 410190 (MISCELLANEOUS) ×1 IMPLANT
HELIX LOCKING TOOL (MISCELLANEOUS) ×2
LEAD TENDRIL MRI 52CM LPA1200M (Lead) ×1 IMPLANT
LEAD TENDRIL SDX 2088TC-58CM (Lead) ×1 IMPLANT
PACEMAKER ASSURITY DR-RF (Pacemaker) ×1 IMPLANT
PAD DEFIB RADIO PHYSIO CONN (PAD) ×2 IMPLANT
SHEATH 8FR PRELUDE SNAP 13 (SHEATH) ×1 IMPLANT
SHEATH 9FR PRELUDE SNAP 13 (SHEATH) ×1 IMPLANT
SLITTER AGILIS HISPRO (INSTRUMENTS) ×1 IMPLANT
TOOL HELIX LOCKING (MISCELLANEOUS) IMPLANT
TRAY PACEMAKER INSERTION (PACKS) ×2 IMPLANT

## 2022-03-03 NOTE — Progress Notes (Signed)
Pt called out stating CBG was 68 according to [facility]. Pt due to go to cath lab. 42m of D50 given.

## 2022-03-03 NOTE — Interval H&P Note (Signed)
History and Physical Interval Note:  03/03/2022 12:29 PM  Laura [name]  has presented today for surgery, with the diagnosis of symptomatic bradycardia, intermediate complete heart block.  The various methods of treatment have been discussed with the patient and family. After consideration of risks, benefits and other options for treatment, the patient has consented to  Procedure(s): PACEMAKER IMPLANT (N/A) as a surgical intervention.  The patient's history has been reviewed, patient examined, no change in status, stable for surgery.  I have reviewed the patient's chart and labs.  Questions were answered to the patient's satisfaction.     Cristopher Peru

## 2022-03-03 NOTE — Discharge Instructions (Addendum)
After Your Pacemaker   You have a [employer] Pacemaker  ACTIVITY Do not lift your arm above shoulder height for 1 week after your procedure. After 7 days, you may progress as below.  You should remove your sling 24 hours after your procedure, unless otherwise instructed by your provider.     Thursday March 10, 2022  Friday March 11, 2022 Saturday March 12, 2022 Sunday March 13, 2022   Do not lift, push, pull, or carry anything over 10 pounds with the affected arm until 6 weeks (Thursday April 14, 2022 ) after your procedure.   You may drive AFTER your wound check, unless you have been told otherwise by your provider.   Ask your healthcare provider when you can go back to work   INCISION/Dressing If you are on a blood thinner such as Coumadin, Xarelto, Eliquis, Plavix, or Pradaxa please confirm with your provider when this should be resumed. 5/30  If large square, outer bandage is left in place, this can be removed after 24 hours from your procedure. Do not remove steri-strips or glue as below.   Monitor your Pacemaker site for redness, swelling, and drainage. Call the device clinic at [phone] if you experience these symptoms or fever/chills.  If your incision is sealed with Steri-strips or staples, you may shower 7 days after your procedure or when told by your provider. Do not remove the steri-strips or let the shower hit directly on your site. You may wash around your site with soap and water.    If you were discharged in a sling, please do not wear this during the day more than 48 hours after your surgery unless otherwise instructed. This may increase the risk of stiffness and soreness in your shoulder.   Avoid lotions, ointments, or perfumes over your incision until it is well-healed.  You may use a hot tub or a pool AFTER your wound check appointment if the incision is completely closed.  Pacemaker Alerts:  Some alerts are vibratory and others beep. These are NOT emergencies. Please  call our office to let us know. If this occurs at night or on weekends, it can wait until the next business day. Send a remote transmission.  If your device is capable of reading fluid status (for heart failure), you will be offered monthly monitoring to review this with you.   DEVICE MANAGEMENT Remote monitoring is used to monitor your pacemaker from home. This monitoring is scheduled every 91 days by our office. It allows [place] to keep an eye on the functioning of your device to ensure it is working properly. You will routinely see your Electrophysiologist annually (more often if necessary).   You should receive your ID card for your new device in 4-8 weeks. Keep this card with you at all times once received. Consider wearing a medical alert bracelet or necklace.  Your Pacemaker may be MRI compatible. This will be discussed at your next office visit/wound check.  You should avoid contact with strong electric or magnetic fields.   Do not use amateur (ham) radio equipment or electric (arc) welding torches. MP3 player headphones with magnets should not be used. Some devices are safe to use if held at least 12 inches (30 cm) from your Pacemaker. These include power tools, lawn mowers, and speakers. If you are unsure if something is safe to use, ask your health care provider.  When using your cell phone, hold it to the ear that is on the opposite side from  the Pacemaker. Do not leave your cell phone in a pocket over the Pacemaker.  You may safely use electric blankets, heating pads, computers, and microwave ovens.  Call the office right away if: You have chest pain. You feel more short of breath than you have felt before. You feel more light-headed than you have felt before. Your incision starts to open up.  This information is not intended to replace advice given to you by your health care provider. Make sure you discuss any questions you have with your health care provider.

## 2022-03-04 ENCOUNTER — Encounter (HOSPITAL_COMMUNITY): Payer: Self-pay | Admitting: Internal Medicine

## 2022-03-04 ENCOUNTER — Telehealth: Payer: Self-pay

## 2022-03-04 LAB — GLUCOSE, CAPILLARY: Glucose-Capillary: 118 mg/dL — ABNORMAL HIGH (ref 70–99)

## 2022-03-04 NOTE — Telephone Encounter (Signed)
Follow-up after same day discharge: Implant date: 03/03/2022 MD: Cristopher Peru, MD Device: SJ PPM Location: Left Chest    Wound check visit: 03/17/2022  90 day MD follow-up: 06/02/2022  Remote Transmission received: Patient needs to be enrolled will contact [employer]  Dressing/sling removed:   Confirm [place] restart on: 03/08/2022   Spoke with patient she stated that she would remove large clear bandage later today, patient voiced understanding of wound check date, lifting restrictions and not to get dressing wet until 7days after procedure patient also confirmed [place] start date.

## 2022-03-04 NOTE — Telephone Encounter (Signed)
-----   Message from [place], [place] sent at 03/03/2022  2:46 PM EDT ----- Same day d/c  [facility] PPM  GT

## 2022-03-08 ENCOUNTER — Telehealth: Payer: Self-pay | Admitting: Cardiology

## 2022-03-08 DIAGNOSIS — I110 Hypertensive heart disease with heart failure: Secondary | ICD-10-CM | POA: Diagnosis not present

## 2022-03-08 DIAGNOSIS — I5032 Chronic diastolic (congestive) heart failure: Secondary | ICD-10-CM | POA: Diagnosis not present

## 2022-03-08 DIAGNOSIS — J4551 Severe persistent asthma with (acute) exacerbation: Secondary | ICD-10-CM | POA: Diagnosis not present

## 2022-03-08 DIAGNOSIS — E1142 Type 2 diabetes mellitus with diabetic polyneuropathy: Secondary | ICD-10-CM | POA: Diagnosis not present

## 2022-03-08 DIAGNOSIS — J449 Chronic obstructive pulmonary disease, unspecified: Secondary | ICD-10-CM | POA: Diagnosis not present

## 2022-03-08 DIAGNOSIS — I4821 Permanent atrial fibrillation: Secondary | ICD-10-CM | POA: Diagnosis not present

## 2022-03-08 NOTE — Telephone Encounter (Signed)
Arnoldo Lenis, MD to Merlene Laughter, RN     12:27 PM Note I had answered prior message but not sure she was called. If no swelling or SOB then would not take torsemide based on weight gain alone. Can take just prn severe swelling or increased SOB/DOE   Zandra Abts MD    Above message was from 02/18/22  I spoke with patient and she continue to watch he weight. She will watch what she is eating also. She will take torsemide if she has SOB or leg swelling.

## 2022-03-08 NOTE — Telephone Encounter (Signed)
  Pt c/o swelling: STAT is pt has developed SOB within 24 hours  If swelling, where is the swelling located? A little swelling in her ankle - not to concern about  How much weight have you gained and in what time span? 7 lbs in a week  Have you gained 3 pounds in a day or 5 pounds in a week? 7 lbs in a week  Do you have a log of your daily weights (if so, list)?   Are you currently taking a fluid pill? No  Are you currently SOB? Only a little bit - not to concern about  Have you traveled recently? No    Pt is concerned that she gained weight 7 lbs in a week

## 2022-03-10 DIAGNOSIS — J449 Chronic obstructive pulmonary disease, unspecified: Secondary | ICD-10-CM | POA: Diagnosis not present

## 2022-03-10 DIAGNOSIS — E1142 Type 2 diabetes mellitus with diabetic polyneuropathy: Secondary | ICD-10-CM | POA: Diagnosis not present

## 2022-03-10 DIAGNOSIS — I110 Hypertensive heart disease with heart failure: Secondary | ICD-10-CM | POA: Diagnosis not present

## 2022-03-10 DIAGNOSIS — I5032 Chronic diastolic (congestive) heart failure: Secondary | ICD-10-CM | POA: Diagnosis not present

## 2022-03-10 DIAGNOSIS — H524 Presbyopia: Secondary | ICD-10-CM | POA: Diagnosis not present

## 2022-03-10 DIAGNOSIS — H52223 Regular astigmatism, bilateral: Secondary | ICD-10-CM | POA: Diagnosis not present

## 2022-03-10 DIAGNOSIS — I4821 Permanent atrial fibrillation: Secondary | ICD-10-CM | POA: Diagnosis not present

## 2022-03-10 DIAGNOSIS — J4551 Severe persistent asthma with (acute) exacerbation: Secondary | ICD-10-CM | POA: Diagnosis not present

## 2022-03-14 ENCOUNTER — Emergency Department (HOSPITAL_COMMUNITY): Payer: Medicare Other

## 2022-03-14 ENCOUNTER — Other Ambulatory Visit: Payer: Self-pay

## 2022-03-14 ENCOUNTER — Telehealth: Payer: Self-pay

## 2022-03-14 ENCOUNTER — Encounter (HOSPITAL_COMMUNITY): Payer: Self-pay | Admitting: Emergency Medicine

## 2022-03-14 ENCOUNTER — Telehealth: Payer: Self-pay | Admitting: Cardiology

## 2022-03-14 ENCOUNTER — Emergency Department (HOSPITAL_COMMUNITY)
Admission: EM | Admit: 2022-03-14 | Discharge: 2022-03-14 | Disposition: A | Discharge status: Home / Self Care | Payer: Medicare Other | Attending: Emergency Medicine | Admitting: Emergency Medicine

## 2022-03-14 DIAGNOSIS — Z7984 Long term (current) use of oral hypoglycemic drugs: Secondary | ICD-10-CM | POA: Diagnosis not present

## 2022-03-14 DIAGNOSIS — R0789 Other chest pain: Secondary | ICD-10-CM | POA: Diagnosis not present

## 2022-03-14 DIAGNOSIS — Z794 Long term (current) use of insulin: Secondary | ICD-10-CM | POA: Diagnosis not present

## 2022-03-14 DIAGNOSIS — R079 Chest pain, unspecified: Secondary | ICD-10-CM | POA: Diagnosis not present

## 2022-03-14 DIAGNOSIS — I10 Essential (primary) hypertension: Secondary | ICD-10-CM | POA: Diagnosis not present

## 2022-03-14 DIAGNOSIS — E119 Type 2 diabetes mellitus without complications: Secondary | ICD-10-CM | POA: Diagnosis not present

## 2022-03-14 DIAGNOSIS — E1142 Type 2 diabetes mellitus with diabetic polyneuropathy: Secondary | ICD-10-CM | POA: Diagnosis not present

## 2022-03-14 DIAGNOSIS — J4551 Severe persistent asthma with (acute) exacerbation: Secondary | ICD-10-CM | POA: Diagnosis not present

## 2022-03-14 DIAGNOSIS — Z743 Need for continuous supervision: Secondary | ICD-10-CM | POA: Diagnosis not present

## 2022-03-14 DIAGNOSIS — R404 Transient alteration of awareness: Secondary | ICD-10-CM | POA: Diagnosis not present

## 2022-03-14 DIAGNOSIS — I5032 Chronic diastolic (congestive) heart failure: Secondary | ICD-10-CM | POA: Diagnosis not present

## 2022-03-14 DIAGNOSIS — R0602 Shortness of breath: Secondary | ICD-10-CM | POA: Diagnosis not present

## 2022-03-14 DIAGNOSIS — Z79899 Other long term (current) drug therapy: Secondary | ICD-10-CM | POA: Insufficient documentation

## 2022-03-14 DIAGNOSIS — R6889 Other general symptoms and signs: Secondary | ICD-10-CM | POA: Diagnosis not present

## 2022-03-14 DIAGNOSIS — Z7901 Long term (current) use of anticoagulants: Secondary | ICD-10-CM | POA: Insufficient documentation

## 2022-03-14 DIAGNOSIS — G4489 Other headache syndrome: Secondary | ICD-10-CM | POA: Diagnosis not present

## 2022-03-14 DIAGNOSIS — J449 Chronic obstructive pulmonary disease, unspecified: Secondary | ICD-10-CM | POA: Diagnosis not present

## 2022-03-14 DIAGNOSIS — I4821 Permanent atrial fibrillation: Secondary | ICD-10-CM | POA: Diagnosis not present

## 2022-03-14 DIAGNOSIS — I110 Hypertensive heart disease with heart failure: Secondary | ICD-10-CM | POA: Diagnosis not present

## 2022-03-14 LAB — COMPREHENSIVE METABOLIC PANEL
ALT: 11 U/L (ref 0–44)
AST: 10 U/L — ABNORMAL LOW (ref 15–41)
Albumin: 3.5 g/dL (ref 3.5–5.0)
Alkaline Phosphatase: 60 U/L (ref 38–126)
Anion gap: 4 — ABNORMAL LOW (ref 5–15)
BUN: 18 mg/dL (ref 6–20)
CO2: 20 mmol/L — ABNORMAL LOW (ref 22–32)
Calcium: 8.7 mg/dL — ABNORMAL LOW (ref 8.9–10.3)
Chloride: 116 mmol/L — ABNORMAL HIGH (ref 98–111)
Creatinine, Ser: 0.75 mg/dL (ref 0.44–1.00)
GFR, Estimated: >60 mL/min (ref >60)
Glucose, Bld: 85 mg/dL (ref 70–99)
Potassium: 3.9 mmol/L (ref 3.5–5.1)
Sodium: 140 mmol/L (ref 135–145)
Total Bilirubin: 0.5 mg/dL (ref 0.3–1.2)
Total Protein: 6.4 g/dL — ABNORMAL LOW (ref 6.5–8.1)

## 2022-03-14 LAB — CBC WITH DIFFERENTIAL/PLATELET
Abs Immature Granulocytes: 0.03 10*3/uL (ref 0.00–0.07)
Basophils Absolute: 0 10*3/uL (ref 0.0–0.1)
Basophils Relative: 0 %
Eosinophils Absolute: 0.1 10*3/uL (ref 0.0–0.5)
Eosinophils Relative: 2 %
HCT: 37.3 % (ref 36.0–46.0)
Hemoglobin: 12.9 g/dL (ref 12.0–15.0)
Immature Granulocytes: 1 %
Lymphocytes Relative: 35 %
Lymphs Abs: 2.1 10*3/uL (ref 0.7–4.0)
MCH: 32.8 pg (ref 26.0–34.0)
MCHC: 34.6 g/dL (ref 30.0–36.0)
MCV: 94.9 fL (ref 80.0–100.0)
Monocytes Absolute: 0.4 10*3/uL (ref 0.1–1.0)
Monocytes Relative: 7 %
Neutro Abs: 3.3 10*3/uL (ref 1.7–7.7)
Neutrophils Relative %: 55 %
Platelets: 187 10*3/uL (ref 150–400)
RBC: 3.93 MIL/uL (ref 3.87–5.11)
RDW: 13.5 % (ref 11.5–15.5)
WBC: 6 10*3/uL (ref 4.0–10.5)
nRBC: 0 % (ref 0.0–0.2)

## 2022-03-14 LAB — CBG MONITORING, ED: Glucose-Capillary: 85 mg/dL (ref 70–99)

## 2022-03-14 LAB — TROPONIN I (HIGH SENSITIVITY)
Troponin I (High Sensitivity): 15 ng/L (ref <18)
Troponin I (High Sensitivity): 17 ng/L (ref <18)

## 2022-03-14 IMAGING — DX DG CHEST 1V PORT
1 series · 1 of 1 positions shown · non-contrast
Comparison: [DATE]

CLINICAL DATA: Chest pain and shortness of breath for several days
following pacemaker placement, initial encounter

EXAM:
PORTABLE CHEST 1 VIEW

[chest ap]
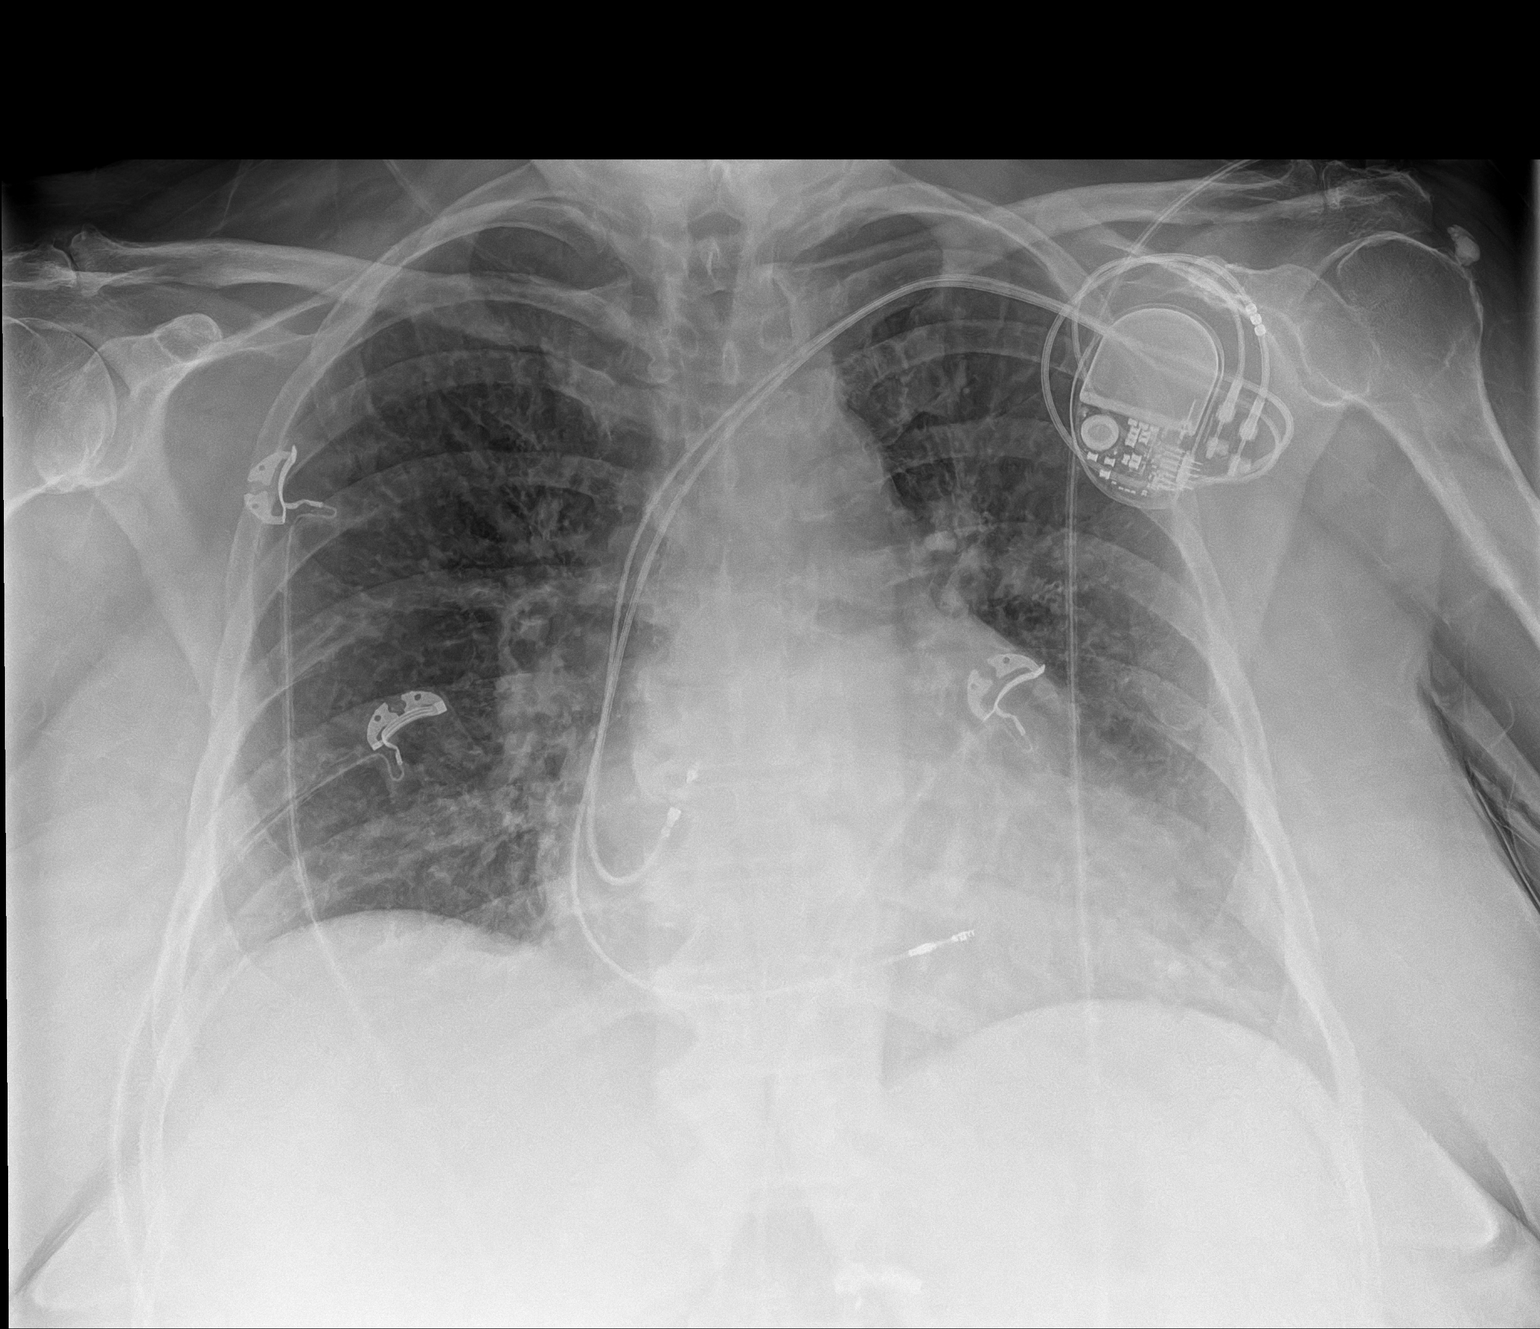

[1 of 1 positions shown; findings below may reference images not displayed]

FINDINGS: Pacing device is again seen and stable. Cardiac shadow is stable. No
pneumothorax is identified. Lungs are clear bilaterally. Mild
central vascular prominence is noted without edema. No acute bony
abnormality is noted.
IMPRESSION: Mild vascular congestion without edema. No other focal abnormality
is noted.

## 2022-03-14 MED ORDER — OXYCODONE-ACETAMINOPHEN 5-325 MG PO TABS: 1.0000 | ORAL_TABLET | Freq: Four times a day (QID) | ORAL | 0 refills | Status: DC | PRN

## 2022-03-14 MED ORDER — OXYCODONE-ACETAMINOPHEN 5-325 MG PO TABS
1.0000 | ORAL_TABLET | Freq: Once | ORAL | Status: DC
  Filled 2022-03-14: qty 1

## 2022-03-14 MED ORDER — HYDROCODONE-ACETAMINOPHEN 5-325 MG PO TABS
1.0000 | ORAL_TABLET | Freq: Once | ORAL | Status: DC
  Filled 2022-03-14: qty 1

## 2022-03-14 MED ORDER — KETOROLAC TROMETHAMINE 30 MG/ML IJ SOLN
30.0000 mg | Freq: Once | INTRAMUSCULAR | Status: AC
  Administered 2022-03-14: 30 mg via INTRAVENOUS
  Filled 2022-03-14: qty 1

## 2022-03-14 NOTE — Telephone Encounter (Signed)
Which medication is she referring to? She should have the below. Please verity with the pharmacy.   divalproex (DEPAKOTE ER) 500 MG 24 hr tablet 270 tablet 0 01/26/2022 04/26/2022  Sig - Route: Take 1 tablet (500 mg total) by mouth daily AND 2 tablets (1,000 mg total) at bedtime. - Oral  Sent to pharmacy as: divalproex (DEPAKOTE ER) 500 MG 24 hr tablet  E-Prescribing Status: Receipt confirmed by pharmacy (01/18/2022 2:00 PM EDT)

## 2022-03-14 NOTE — Telephone Encounter (Signed)
pt left a message that the [facility] not get any refill medications she needs her medications sent as soon as possible.

## 2022-03-14 NOTE — Telephone Encounter (Signed)
Pt is being seen in the ED for atrial flutter. Will fwd to provider as FYI.

## 2022-03-14 NOTE — ED Provider Notes (Signed)
[facility] EMERGENCY DEPARTMENT Provider Note   CSN: [id number] Arrival date & time: 03/14/22  1536     History {Add pertinent medical, surgical, social history, OB history to HPI:1} Chief Complaint  Patient presents with   Chest Pain    Laura [name] is a 61 y.o. female.  Patient complains of left-sided chest discomfort.  It seems to be worse with movement   Chest Pain     Home Medications Prior to Admission medications   Medication Sig Start Date End Date Taking? Authorizing Provider  oxyCODONE-acetaminophen (PERCOCET) 5-325 MG tablet Take 1 tablet by mouth every 6 (six) hours as needed. 03/14/22  Yes Milton Ferguson, MD  acetaminophen (TYLENOL) 325 MG tablet Take 2 tablets (650 mg total) by mouth every 6 (six) hours as needed for mild pain, fever or headache. 08/11/18   Roxan Hockey, MD  albuterol (PROVENTIL) (2.5 MG/3ML) 0.083% nebulizer solution Take 3 mLs (2.5 mg total) by nebulization every 4 (four) hours as needed for wheezing or shortness of breath. 04/07/21   Althea Charon, FNP  albuterol (VENTOLIN HFA) 108 (90 Base) MCG/ACT inhaler INHALE 2 PUFFS INTO THE LUNGS EVERY 6 HOURS AS NEEDED FOR WHEEZING OR SHORTNESS OF BREATH 04/07/21   Althea Charon, FNP  apixaban (ELIQUIS) 5 MG TABS tablet Take 1 tablet (5 mg total) by mouth 2 (two) times daily. 07/14/21   Arnoldo Lenis, MD  atorvastatin (LIPITOR) 10 MG tablet Take 10 mg by mouth at bedtime.    [provider]  BD PEN NEEDLE NANO 2ND GEN 32G X 4 MM MISC 1 each by Other route in the morning, at noon, in the evening, and at bedtime. 05/20/20   [provider]  Benralizumab (FASENRA PEN) 30 MG/ML SOAJ Inject 1 mL (30 mg total) into the skin every 28 (twenty-eight) days. Then every 8 weeks 02/15/22   Ambs, Kathrine Cords, FNP  blood glucose meter kit and supplies 1 each by Other route 4 (four) times daily. Dispense based on patient and insurance preference. Use up to four times daily as directed. (FOR ICD-10 E10.9,  E11.9). 01/27/21   Cassandria Anger, MD  Cholecalciferol (VITAMIN D3) 125 MCG (5000 UT) CAPS Take 1 capsule (5,000 Units total) by mouth daily. 11/25/20   Cassandria Anger, MD  Continuous Blood Gluc Sensor (FREESTYLE LIBRE 3 SENSOR) MISC 1 Piece by Does not apply route every 14 (fourteen) days. Place 1 sensor on the skin every 14 days. Use to check glucose continuously 10/28/21   Cassandria Anger, MD  CUVITRU 10 GM/50ML SOLN Inject 25 mg into the skin every 14 (fourteen) days. 01/24/22   [provider]  diltiazem (CARDIZEM CD) 180 MG 24 hr capsule Take 1 capsule (180 mg total) by mouth daily. 02/18/22   [name], MD  divalproex (DEPAKOTE ER) 500 MG 24 hr tablet Take 1 tablet (500 mg total) by mouth daily AND 2 tablets (1,000 mg total) at bedtime. 01/26/22 04/26/22  [name], MD  Dulaglutide (TRULICITY) 3 NF/6.2ZH SOPN Inject 3 mg as directed once a week. Patient taking differently: Inject 1.5 mg as directed once a week. 02/24/22   Cassandria Anger, MD  ELDERBERRY PO Take 4 g by mouth daily. Gummies 2g each    [provider]  EPINEPHrine 0.3 mg/0.3 mL IJ SOAJ injection Inject 0.3 mg into the muscle once as needed for anaphylaxis. 01/17/20   [provider]  ferrous sulfate 324 MG TBEC Take 324 mg by mouth every evening.  [provider]  fluticasone (FLOVENT HFA) 110 MCG/ACT inhaler Inhale 2 puffs into the lungs 2 (two) times daily. 12/31/21   Ambs, Kathrine Cords, FNP  fluticasone furoate-vilanterol (BREO ELLIPTA) 100-25 MCG/ACT AEPB Inhale 1 puff into the lungs daily. 02/18/22   Rigoberto Noel, MD  gabapentin (NEURONTIN) 600 MG tablet Take 600 mg by mouth 3 (three) times daily. 07/20/21   [provider]  hydrOXYzine (ATARAX/VISTARIL) 25 MG tablet Take 1 tablet (25 mg total) by mouth every 6 (six) hours as needed for anxiety or nausea. 08/11/18   Roxan Hockey, MD  Immune Globulin, Human, (CUVITRU [place]) Inject 25 mg into the skin every 14  (fourteen) days. 12/24/21   [provider]  insulin glargine (LANTUS SOLOSTAR) 100 UNIT/ML Solostar Pen Inject 80 Units into the skin at bedtime. 02/24/22   Cassandria Anger, MD  Insulin Pen Needle (PEN NEEDLES 3/16") 31G X 5 MM MISC Use as directed with insulin pen 10/21/21   Cassandria Anger, MD  levalbuterol [facility] HFA) 45 MCG/ACT inhaler Inhale 2 puffs into the lungs every 6 (six) hours as needed. 02/10/22   [provider]  levalbuterol Penne Lash) 1.25 MG/3ML nebulizer solution Inhale 1.25 mg into the lungs daily as needed (asthma). 02/09/22   [provider]  levothyroxine (SYNTHROID, LEVOTHROID) 112 MCG tablet Take 112 mcg by mouth daily before breakfast.    [provider]  lidocaine-prilocaine (EMLA) cream Apply 1 application. topically as needed [facility]). 12/03/21   [provider]  linaclotide Rolan Lipa) 145 MCG CAPS capsule Take 1 capsule (145 mcg total) by mouth daily before breakfast. Office visit needed before further refills 02/11/22   Mahala Menghini, PA-C  losartan (COZAAR) 25 MG tablet Take 1 tablet (25 mg total) by mouth daily. Patient taking differently: Take 50 mg by mouth daily. 02/18/22   [name], MD  MELATONIN GUMMIES PO Take 10 mg by mouth at bedtime.    [provider]  metFORMIN (GLUCOPHAGE) 500 MG tablet Take 500 mg by mouth 2 (two) times daily. 07/21/21   [provider]  metoCLOPramide (REGLAN) 5 MG tablet Take 5 mg by mouth 3 (three) times daily. 04/05/21   [provider]  montelukast (SINGULAIR) 10 MG tablet Take 10 mg by mouth at bedtime. 11/23/20   [provider]  Multiple Vitamins-Minerals (MULTIVITAMIN WITH MINERALS) tablet Take 1 tablet by mouth daily. Woman    [provider]  Nebulizer MISC Nebulizer tubing kit 10/21/20   Valentina Shaggy, MD  omeprazole (PRILOSEC) 20 MG capsule Take 1 capsule (20 mg total) by mouth 2 (two) times daily before a meal. 12/31/21    Ambs, Kathrine Cords, FNP  ondansetron (ZOFRAN-ODT) 8 MG disintegrating tablet Take 8 mg by mouth daily as needed for vomiting or nausea.    [provider]  [facility] ULTRA test strip 1 each by Other route in the morning, at noon, in the evening, and at bedtime. 10/17/19   [provider]  polyethylene glycol (MIRALAX / GLYCOLAX) 17 g packet Take 17 g by mouth daily as needed. Patient taking differently: Take 17 g by mouth daily as needed for moderate constipation or severe constipation. 04/17/21   [place] Donnamarie Poag, DO  Potassium Chloride ER 20 MEQ TBCR Take 20 mEq by mouth daily. 01/04/21   [provider]  Spacer/Aero-Holding Chambers (AEROCHAMBER PLUS) inhaler 1 each by Other route as needed for other. Use as instructed 02/07/22   Ambs, Kathrine Cords, FNP  topiramate (TOPAMAX)  100 MG tablet Take 1 tablet (100 mg total) by mouth 2 (two) times daily. 08/05/21   Tat, Eustace Quail, DO  torsemide (DEMADEX) 20 MG tablet Take 1 tablet daily for fluid gain of 3 lb overnight or 5 lbs in a week. 01/25/22   Arnoldo Lenis, MD  triamcinolone cream (KENALOG) 0.1 % Apply 1 application. topically daily as needed (foot). 03/15/21   [provider]  vitamin B-12 (CYANOCOBALAMIN) 1000 MCG tablet Take 1,000 mcg by mouth daily.    [provider]  vitamin C (ASCORBIC ACID) 500 MG tablet Take 500 mg by mouth daily. Power C immune support    [provider]      Allergies    Ativan [lorazepam] and Phenergan [promethazine hcl]    Review of Systems   Review of Systems  Cardiovascular:  Positive for chest pain.   Physical Exam Updated Vital Signs BP (!) 130/59   Pulse 69   Resp 18   SpO2 99%  Physical Exam  ED Results / Procedures / Treatments   Labs (all labs ordered are listed, but only abnormal results are displayed) Labs Reviewed  COMPREHENSIVE METABOLIC PANEL - Abnormal; Notable for the following components:      Result Value   Chloride 116 (*)    CO2 20 (*)     Calcium 8.7 (*)    Total Protein 6.4 (*)    AST 10 (*)    Anion gap 4 (*)    All other components within normal limits  CBC WITH DIFFERENTIAL/PLATELET  CBG MONITORING, ED  TROPONIN I (HIGH SENSITIVITY)  TROPONIN I (HIGH SENSITIVITY)    EKG EKG Interpretation  Date/Time:  Monday March 14 2022 15:45:49 EDT Ventricular Rate:  79 PR Interval:    QRS Duration: 142 QT Interval:  469 QTC Calculation: 521 R Axis:   64 Text Interpretation: Atrial flutter Nonspecific intraventricular conduction delay Confirmed by Milton Ferguson [phone]) on 03/14/2022 7:34:11 PM  Radiology DG Chest Port 1 View  Result Date: 03/14/2022 CLINICAL DATA:  Chest pain and shortness of breath for several days following pacemaker placement, initial encounter EXAM: PORTABLE CHEST 1 VIEW COMPARISON:  03/03/2022 FINDINGS: Pacing device is again seen and stable. Cardiac shadow is stable. No pneumothorax is identified. Lungs are clear bilaterally. Mild central vascular prominence is noted without edema. No acute bony abnormality is noted. IMPRESSION: Mild vascular congestion without edema. No other focal abnormality is noted. Electronically Signed   By: Inez Catalina M.D.   On: 03/14/2022 19:06    Procedures Procedures  {Document cardiac monitor, telemetry assessment procedure when appropriate:1}  Medications Ordered in ED Medications  HYDROcodone-acetaminophen (NORCO/VICODIN) 5-325 MG per tablet 1 tablet (1 tablet Oral Patient Refused/Not Given 03/14/22 1711)  oxyCODONE-acetaminophen (PERCOCET/ROXICET) 5-325 MG per tablet 1 tablet (1 tablet Oral Patient Refused/Not Given 03/14/22 1755)  ketorolac (TORADOL) 30 MG/ML injection 30 mg (30 mg Intravenous Given 03/14/22 1808)    ED Course/ Medical Decision Making/ A&P                           Medical Decision Making Amount and/or Complexity of Data Reviewed Labs: ordered. Radiology: ordered.  Risk Prescription drug management.   Patient with atypical chest pain and chest  wall pain.   EKG does not show any acute ischemic changes and troponins have been negative.  She is sent home with pain medicine will follow-up with her cardiologist this week  {Document critical care time when  appropriate:1} {Document review of labs and clinical decision tools ie heart score, Chads2Vasc2 etc:1}  {Document your independent review of radiology images, and any outside records:1} {Document your discussion with family members, caretakers, and with consultants:1} {Document social determinants of health affecting pt's care:1} {Document your decision making why or why not admission, treatments were needed:1} Final Clinical Impression(s) / ED Diagnoses Final diagnoses:  Atypical chest pain    Rx / DC Orders ED Discharge Orders          Ordered    oxyCODONE-acetaminophen (PERCOCET) 5-325 MG tablet  Every 6 hours PRN        03/14/22 1935

## 2022-03-14 NOTE — ED Notes (Signed)
Went over d/c papers. All questions answered. Wheeled out to lobby to wait on ride

## 2022-03-14 NOTE — Telephone Encounter (Signed)
  Pt calling, she said she is in [facility] ED right now and wanted for D. Branch to know

## 2022-03-14 NOTE — ED Notes (Signed)
Gave pt snack

## 2022-03-14 NOTE — Telephone Encounter (Signed)
Dr. Hisada's pt 

## 2022-03-14 NOTE — Telephone Encounter (Signed)
[facility] is the [facility]

## 2022-03-14 NOTE — Discharge Instructions (Signed)
Follow-up with Dr. Lovena Le as planned this week

## 2022-03-14 NOTE — ED Notes (Signed)
Edp in room  

## 2022-03-14 NOTE — ED Triage Notes (Signed)
Pt BIB [facility] with reports of chest pain, SHOB, and headache that started after getting pacemaker placed on May 25th.

## 2022-03-16 NOTE — Telephone Encounter (Signed)
Pt stated that she has been experiencing palpitations almost daily. She also wanted to inform provider that she has appt with Dr. Lovena Le on 6/8 for pacer check.

## 2022-03-16 NOTE — Telephone Encounter (Signed)
ER note reviewed, testing looked ok. Has she been having a lot of palpitations? If so we could consider adding back some bisoprolol or increasing diltiazem now that she has pacemaker we don't have to worry about these medications causing her heart rates to get too low  Laura Abts MD

## 2022-03-16 NOTE — Telephone Encounter (Signed)
We can see how eval tomorrow goes. It may be just a wound check and device check, im not sure its a regular clinic appt. THe device check can tell us how fast heart rates are going and help guide med changes   Zandra Abts MD

## 2022-03-17 ENCOUNTER — Ambulatory Visit (INDEPENDENT_AMBULATORY_CARE_PROVIDER_SITE_OTHER): Payer: Medicare Other

## 2022-03-17 DIAGNOSIS — D839 Common variable immunodeficiency, unspecified: Secondary | ICD-10-CM | POA: Diagnosis not present

## 2022-03-17 DIAGNOSIS — R55 Syncope and collapse: Secondary | ICD-10-CM | POA: Diagnosis not present

## 2022-03-17 LAB — CUP PACEART INCLINIC DEVICE CHECK
Battery Remaining Longevity: 138 mo
Battery Voltage: 3.13 V
Brady Statistic RA Percent Paced: 0 %
Brady Statistic RV Percent Paced: 29 %
Date Time Interrogation Session: 20230608090700
Implantable Lead Implant Date: 20230525
Implantable Lead Implant Date: 20230525
Implantable Lead Location: 753859
Implantable Lead Location: 753860
Implantable Pulse Generator Implant Date: 20230525
Lead Channel Impedance Value: 450 Ohm
Lead Channel Impedance Value: 462.5 Ohm
Lead Channel Pacing Threshold Amplitude: 0.75 V
Lead Channel Pacing Threshold Amplitude: 0.75 V
Lead Channel Pacing Threshold Pulse Width: 0.5 ms
Lead Channel Pacing Threshold Pulse Width: 0.5 ms
Lead Channel Sensing Intrinsic Amplitude: 11.5 mV
Lead Channel Sensing Intrinsic Amplitude: 2.2 mV
Lead Channel Setting Pacing Amplitude: 0.875
Lead Channel Setting Pacing Amplitude: 3.5 V
Lead Channel Setting Pacing Pulse Width: 0.5 ms
Lead Channel Setting Sensing Sensitivity: 0.5 mV
Pulse Gen Model: 2272
Pulse Gen Serial Number: 8084453

## 2022-03-17 NOTE — Patient Instructions (Signed)
   After Your Pacemaker   Monitor your pacemaker site for redness, swelling, and drainage. Call the device clinic at [phone] if you experience these symptoms or fever/chills.  Your incision was closed with Steri-strips or staples:  You may shower 7 days after your procedure and wash your incision with soap and water. Avoid lotions, ointments, or perfumes over your incision until it is well-healed.   Do not lift, push or pull greater than 10 pounds with the affected arm until April 14 2022 There are no other restrictions in arm movement after your wound check appointment.   Remote monitoring is used to monitor your pacemaker from home. This monitoring is scheduled every 91 days by our office. It allows [place] to keep an eye on the functioning of your device to ensure it is working properly. You will routinely see your Electrophysiologist annually (more often if necessary).

## 2022-03-17 NOTE — Progress Notes (Signed)
  Wound check appointment. Steri-strips removed. Wound without redness or edema. Incision edges approximated, wound well healed. Normal device function. Thresholds, sensing, and impedances consistent with implant measurements. Device programmed at 3.5V/auto capture programmed on for extra safety margin until 3 month visit. Histogram distribution appropriate for patient and level of activity. 99% AT/AF + Eliquis V rates >110bpm 75% of the time No high ventricular rates noted. Patient educated about wound care, arm mobility, lifting restrictions. ROV 06/02/22 with GT

## 2022-03-18 ENCOUNTER — Encounter (HOSPITAL_COMMUNITY): Payer: Self-pay | Admitting: *Deleted

## 2022-03-18 ENCOUNTER — Telehealth: Payer: Self-pay | Admitting: Cardiology

## 2022-03-18 ENCOUNTER — Emergency Department (HOSPITAL_COMMUNITY): Payer: Medicare Other

## 2022-03-18 ENCOUNTER — Other Ambulatory Visit: Payer: Self-pay

## 2022-03-18 ENCOUNTER — Emergency Department (HOSPITAL_COMMUNITY)
Admission: EM | Admit: 2022-03-18 | Discharge: 2022-03-18 | Disposition: A | Discharge status: Home / Self Care | Payer: Medicare Other | Attending: Emergency Medicine | Admitting: Emergency Medicine

## 2022-03-18 ENCOUNTER — Other Ambulatory Visit: Payer: Self-pay | Admitting: Pulmonary Disease

## 2022-03-18 DIAGNOSIS — Z7984 Long term (current) use of oral hypoglycemic drugs: Secondary | ICD-10-CM | POA: Diagnosis not present

## 2022-03-18 DIAGNOSIS — I509 Heart failure, unspecified: Secondary | ICD-10-CM | POA: Diagnosis not present

## 2022-03-18 DIAGNOSIS — I110 Hypertensive heart disease with heart failure: Secondary | ICD-10-CM | POA: Diagnosis not present

## 2022-03-18 DIAGNOSIS — R0789 Other chest pain: Secondary | ICD-10-CM | POA: Diagnosis not present

## 2022-03-18 DIAGNOSIS — Z79899 Other long term (current) drug therapy: Secondary | ICD-10-CM | POA: Insufficient documentation

## 2022-03-18 DIAGNOSIS — Z743 Need for continuous supervision: Secondary | ICD-10-CM | POA: Diagnosis not present

## 2022-03-18 DIAGNOSIS — Z794 Long term (current) use of insulin: Secondary | ICD-10-CM | POA: Insufficient documentation

## 2022-03-18 DIAGNOSIS — R6889 Other general symptoms and signs: Secondary | ICD-10-CM | POA: Diagnosis not present

## 2022-03-18 DIAGNOSIS — Z7901 Long term (current) use of anticoagulants: Secondary | ICD-10-CM | POA: Insufficient documentation

## 2022-03-18 DIAGNOSIS — J45909 Unspecified asthma, uncomplicated: Secondary | ICD-10-CM | POA: Insufficient documentation

## 2022-03-18 DIAGNOSIS — E119 Type 2 diabetes mellitus without complications: Secondary | ICD-10-CM | POA: Diagnosis not present

## 2022-03-18 DIAGNOSIS — Z7951 Long term (current) use of inhaled steroids: Secondary | ICD-10-CM | POA: Insufficient documentation

## 2022-03-18 DIAGNOSIS — R079 Chest pain, unspecified: Secondary | ICD-10-CM | POA: Diagnosis not present

## 2022-03-18 DIAGNOSIS — I499 Cardiac arrhythmia, unspecified: Secondary | ICD-10-CM | POA: Diagnosis not present

## 2022-03-18 DIAGNOSIS — E039 Hypothyroidism, unspecified: Secondary | ICD-10-CM | POA: Diagnosis not present

## 2022-03-18 LAB — BASIC METABOLIC PANEL
Anion gap: 6 (ref 5–15)
BUN: 21 mg/dL — ABNORMAL HIGH (ref 6–20)
CO2: 19 mmol/L — ABNORMAL LOW (ref 22–32)
Calcium: 9.3 mg/dL (ref 8.9–10.3)
Chloride: 114 mmol/L — ABNORMAL HIGH (ref 98–111)
Creatinine, Ser: 0.69 mg/dL (ref 0.44–1.00)
GFR, Estimated: >60 mL/min (ref >60)
Glucose, Bld: 82 mg/dL (ref 70–99)
Potassium: 3.9 mmol/L (ref 3.5–5.1)
Sodium: 139 mmol/L (ref 135–145)

## 2022-03-18 LAB — CBC
HCT: 40.9 % (ref 36.0–46.0)
Hemoglobin: 13.9 g/dL (ref 12.0–15.0)
MCH: 31.9 pg (ref 26.0–34.0)
MCHC: 34 g/dL (ref 30.0–36.0)
MCV: 93.8 fL (ref 80.0–100.0)
Platelets: 211 10*3/uL (ref 150–400)
RBC: 4.36 MIL/uL (ref 3.87–5.11)
RDW: 13.3 % (ref 11.5–15.5)
WBC: 4.3 10*3/uL (ref 4.0–10.5)
nRBC: 0 % (ref 0.0–0.2)

## 2022-03-18 LAB — TROPONIN I (HIGH SENSITIVITY)
Troponin I (High Sensitivity): 15 ng/L (ref <18)
Troponin I (High Sensitivity): 16 ng/L (ref <18)

## 2022-03-18 LAB — CBG MONITORING, ED
Glucose-Capillary: 60 mg/dL — ABNORMAL LOW (ref 70–99)
Glucose-Capillary: 91 mg/dL (ref 70–99)

## 2022-03-18 IMAGING — CT CT CHEST W/O CM
2 of 4 series · 15 of 36 positions shown, 18 images · non-contrast
Comparison: CT a chest [DATE]

CLINICAL DATA: Chest pain



[Series 2: routine chest without · axial · non-contrast · 0.79mm/px · z∈[-600,-336]mm · 12 of 157 slices shown, 15 images]
[im 13/157  mediastinal]
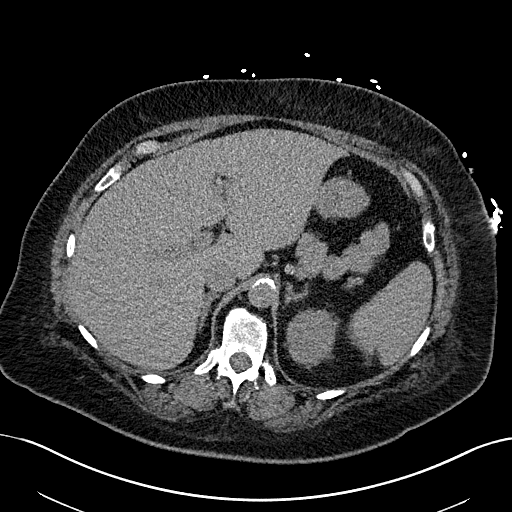
[im 13/157  lung]
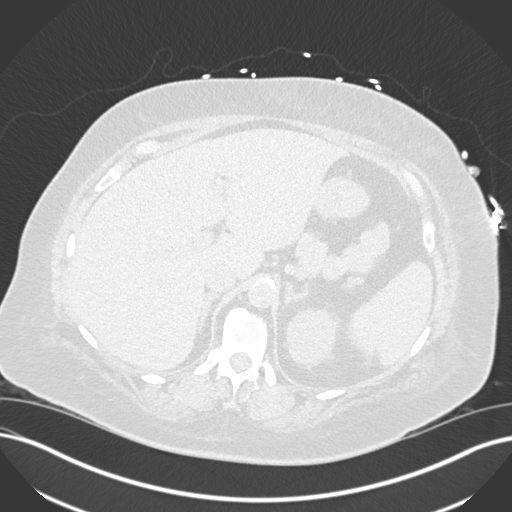
[im 25/157  lung]
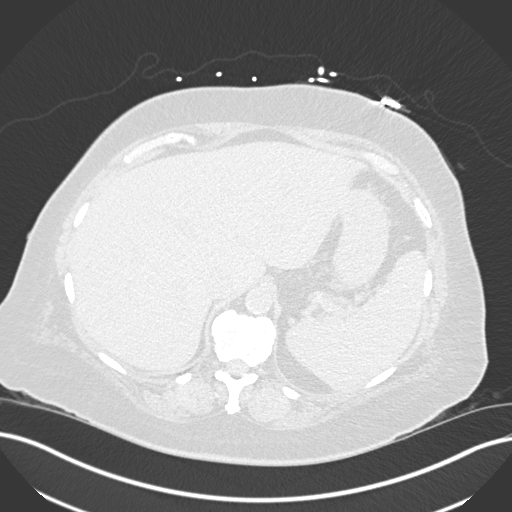
[im 37/157  lung]
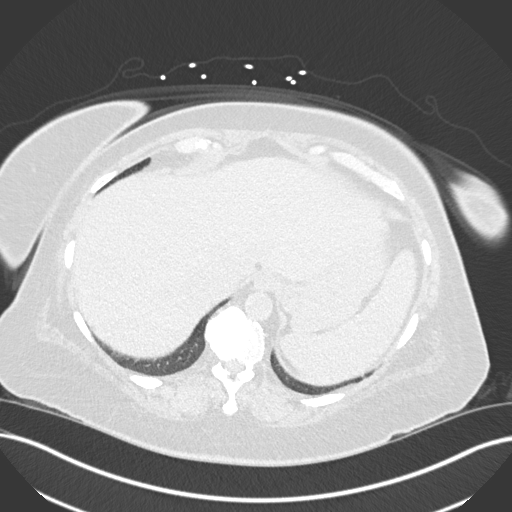
[im 49/157  lung]
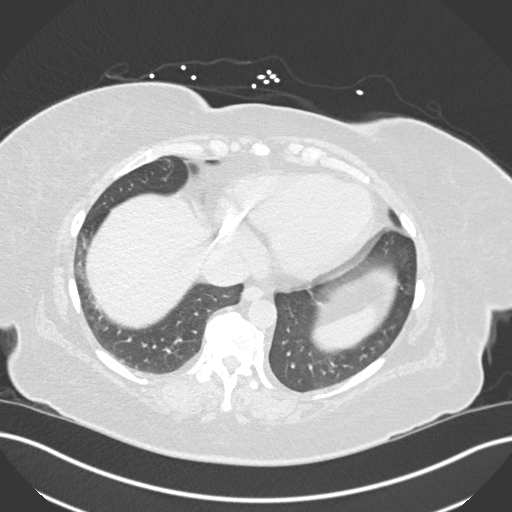
[im 61/157  mediastinal]
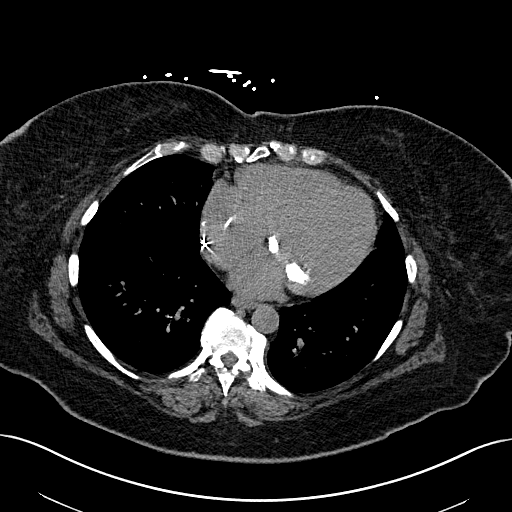
[im 61/157  lung]
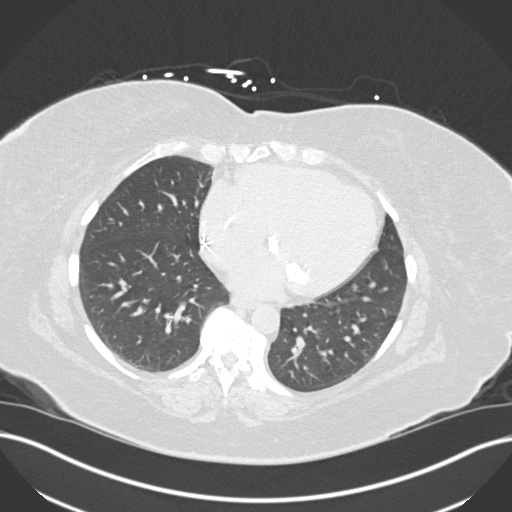
[im 73/157  lung]
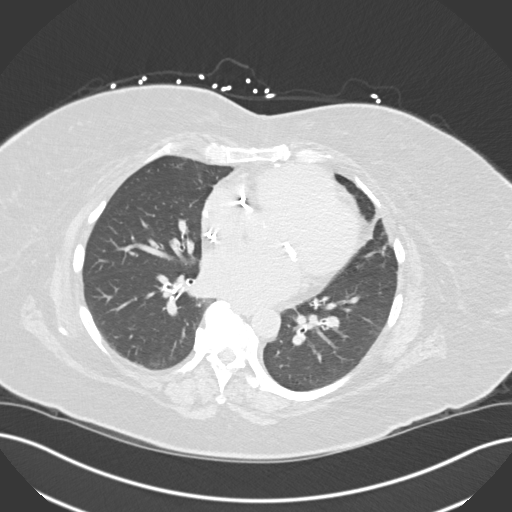
[im 85/157  lung]
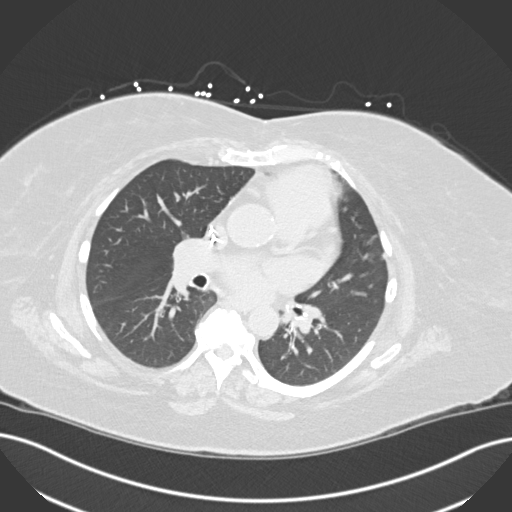
[im 97/157  lung]
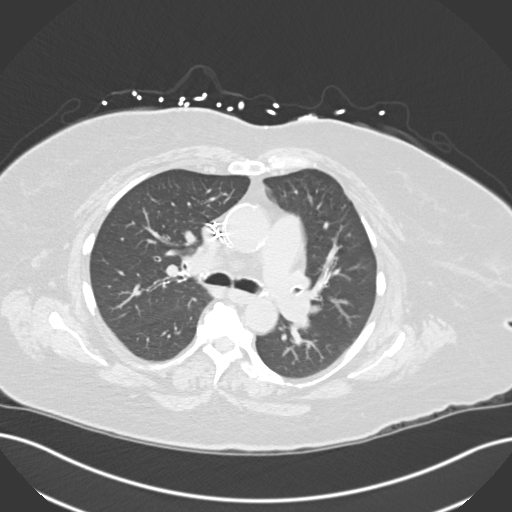
[im 109/157  mediastinal]
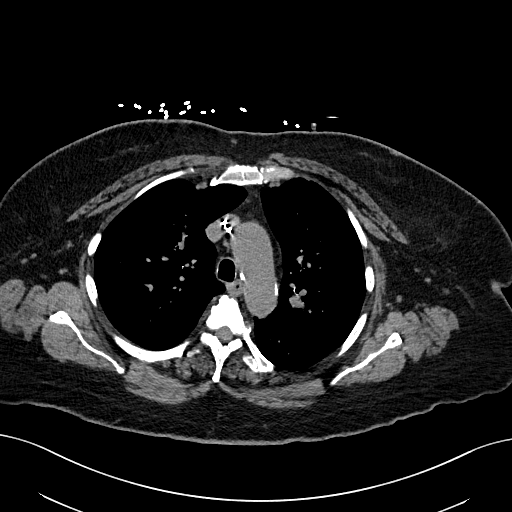
[im 109/157  lung]
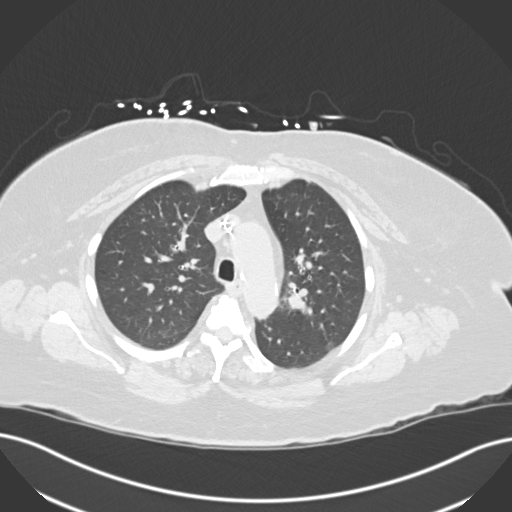
[im 121/157  lung]
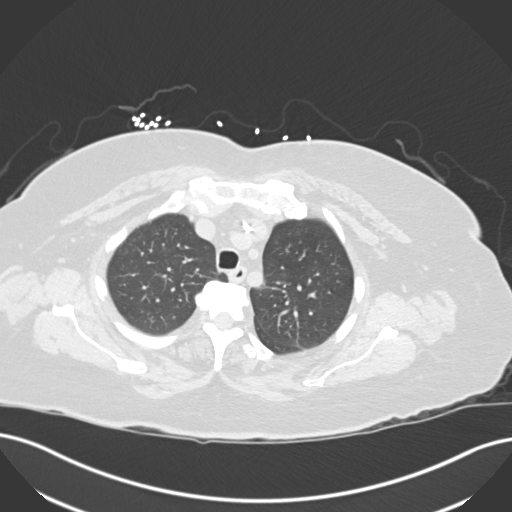
[im 133/157  lung]
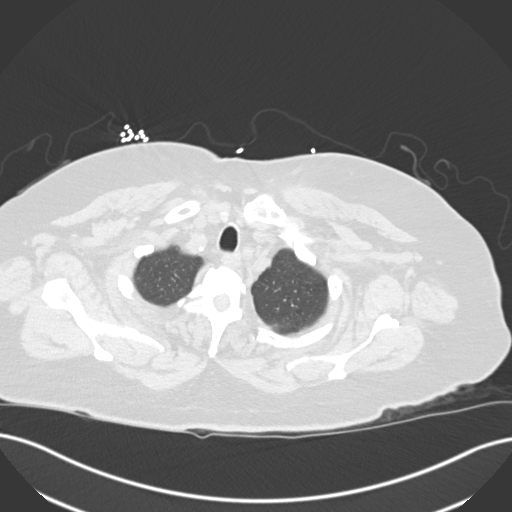
[im 145/157  lung]
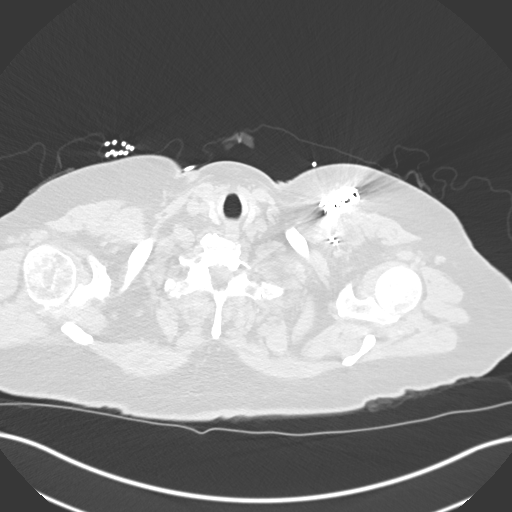

[Series 5: coronal · coronal · 0.68mm/px · 3 of 160 slices shown]
[im 32/160  lung]
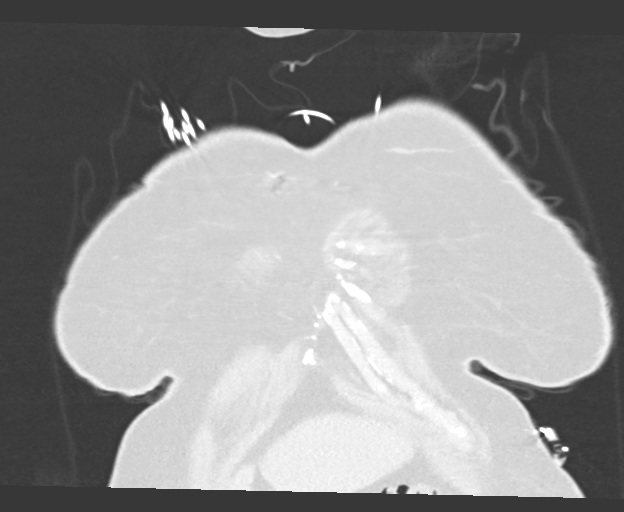
[im 64/160  lung]
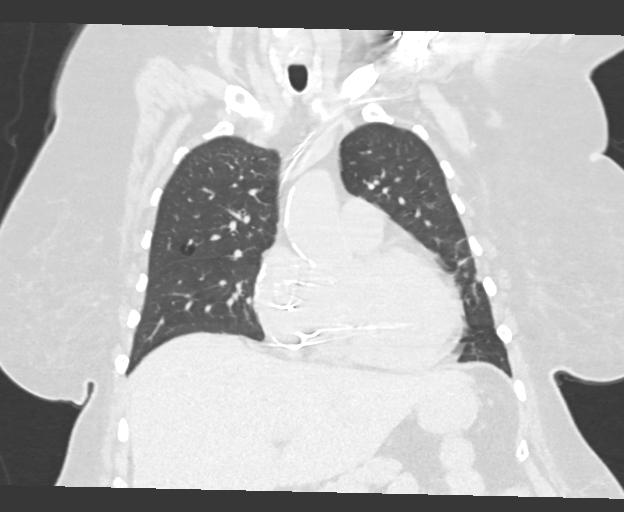
[im 96/160  lung]
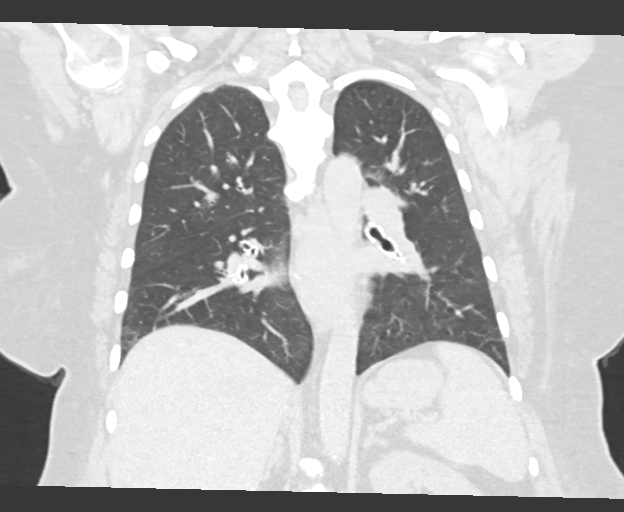

[15 of 36 positions shown; findings below may reference images not displayed]

FINDINGS: Cardiovascular: The heart size is normal. No substantial pericardial
effusion. Coronary artery calcification is evident. Mild
atherosclerotic calcification is noted in the wall of the thoracic
aorta. Mitral annular calcification evident.

Mediastinum/Nodes: No mediastinal lymphadenopathy. No evidence for
gross hilar lymphadenopathy although assessment is limited by the
lack of intravenous contrast on the current study. The esophagus has
normal imaging features. There is no axillary lymphadenopathy.

Lungs/Pleura: No suspicious pulmonary nodule or mass. No focal
airspace consolidation. There is no evidence of pleural effusion.

Upper Abdomen: Unremarkable.

Musculoskeletal: Gas in the subcutaneous fat of the left upper
anterior abdominal wall is presumably secondary to injections. No
worrisome lytic or sclerotic osseous abnormality.
IMPRESSION: No acute findings in the chest. Specifically, no findings to explain
the patient's history of chest pain.

Gas in the anterior subcutaneous fat of the upper abdominal wall is
presumably related to injections.

Aortic Atherosclerosis ([15]-[15]).

## 2022-03-18 IMAGING — DX DG CHEST 2V
2 series · 2 of 2 positions shown · non-contrast
Comparison: [facility] chest [DATE], chest two views [DATE]

CLINICAL DATA: Chest pain since yesterday. Pacemaker put in 2 weeks
ago.

EXAM:
CHEST - 2 VIEW

[chest pa]
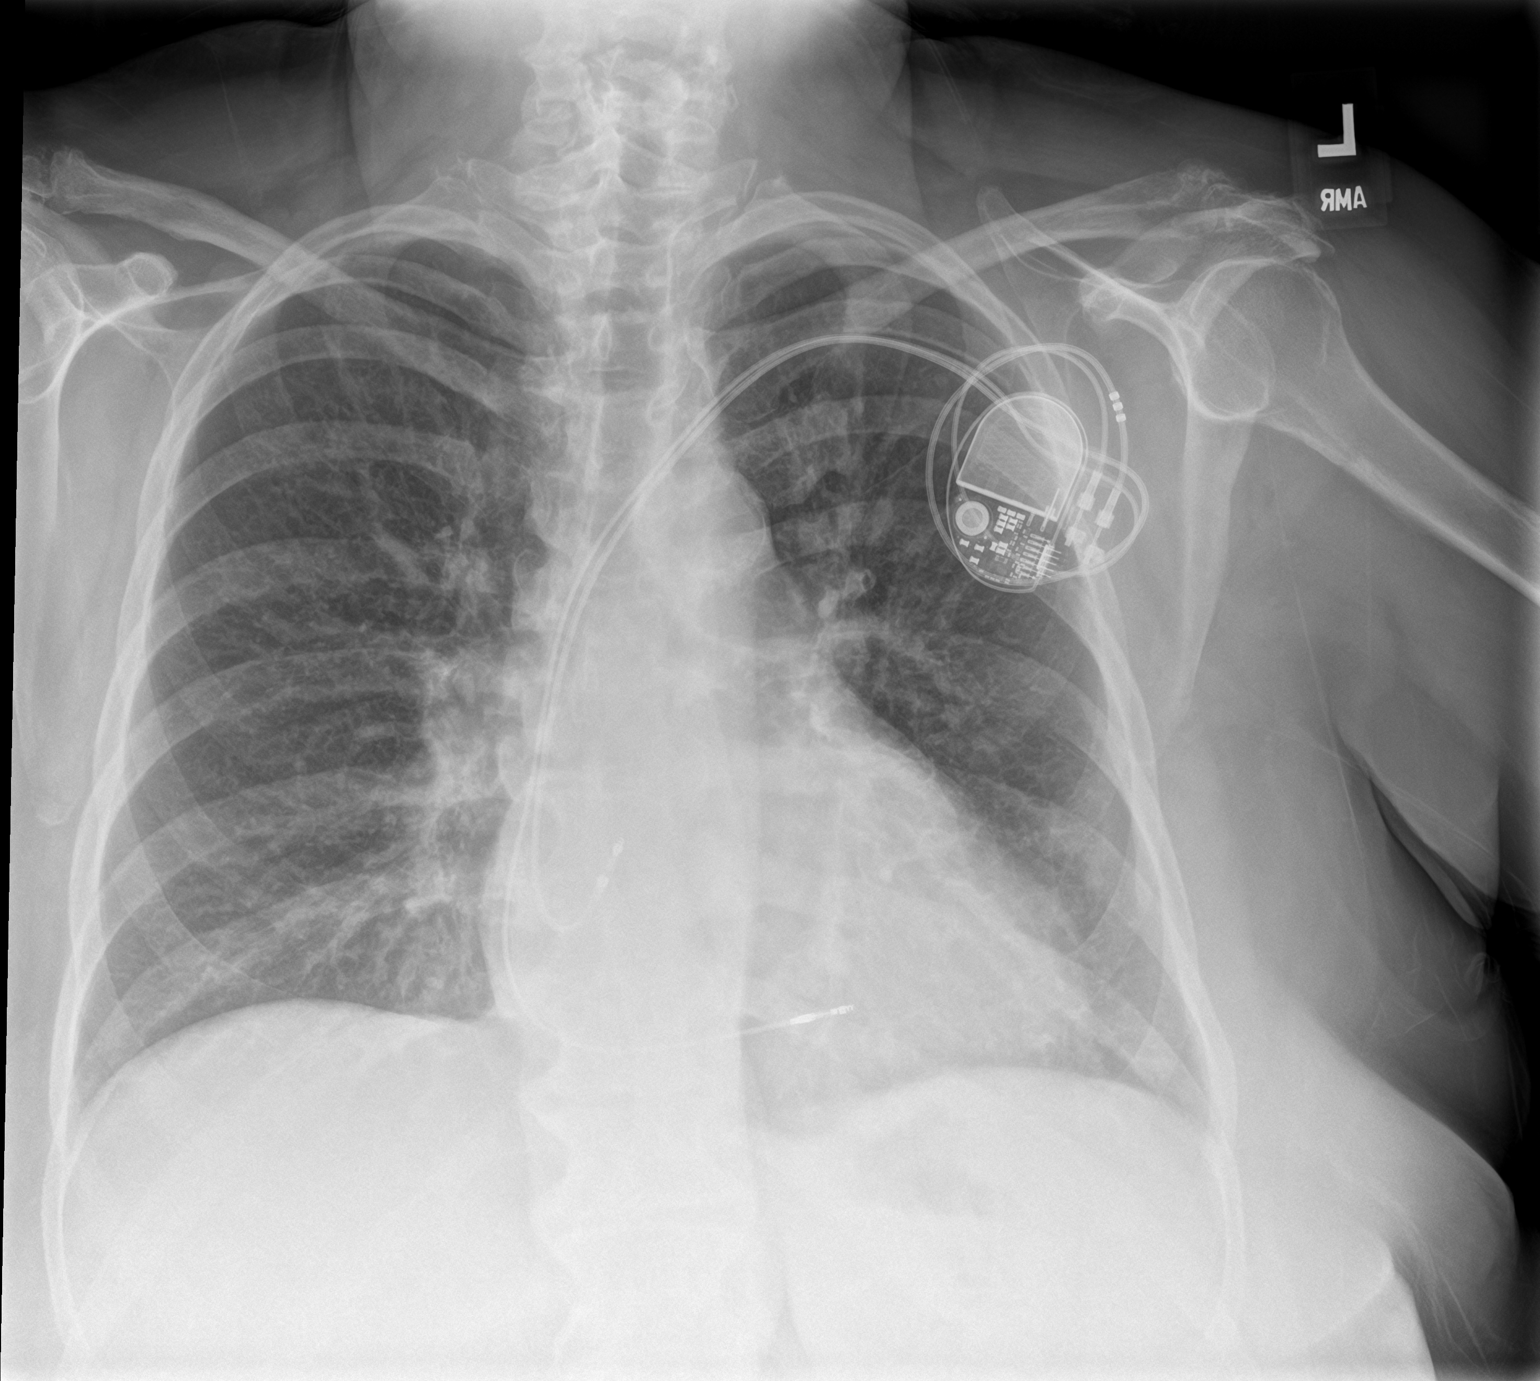

[chest lat]
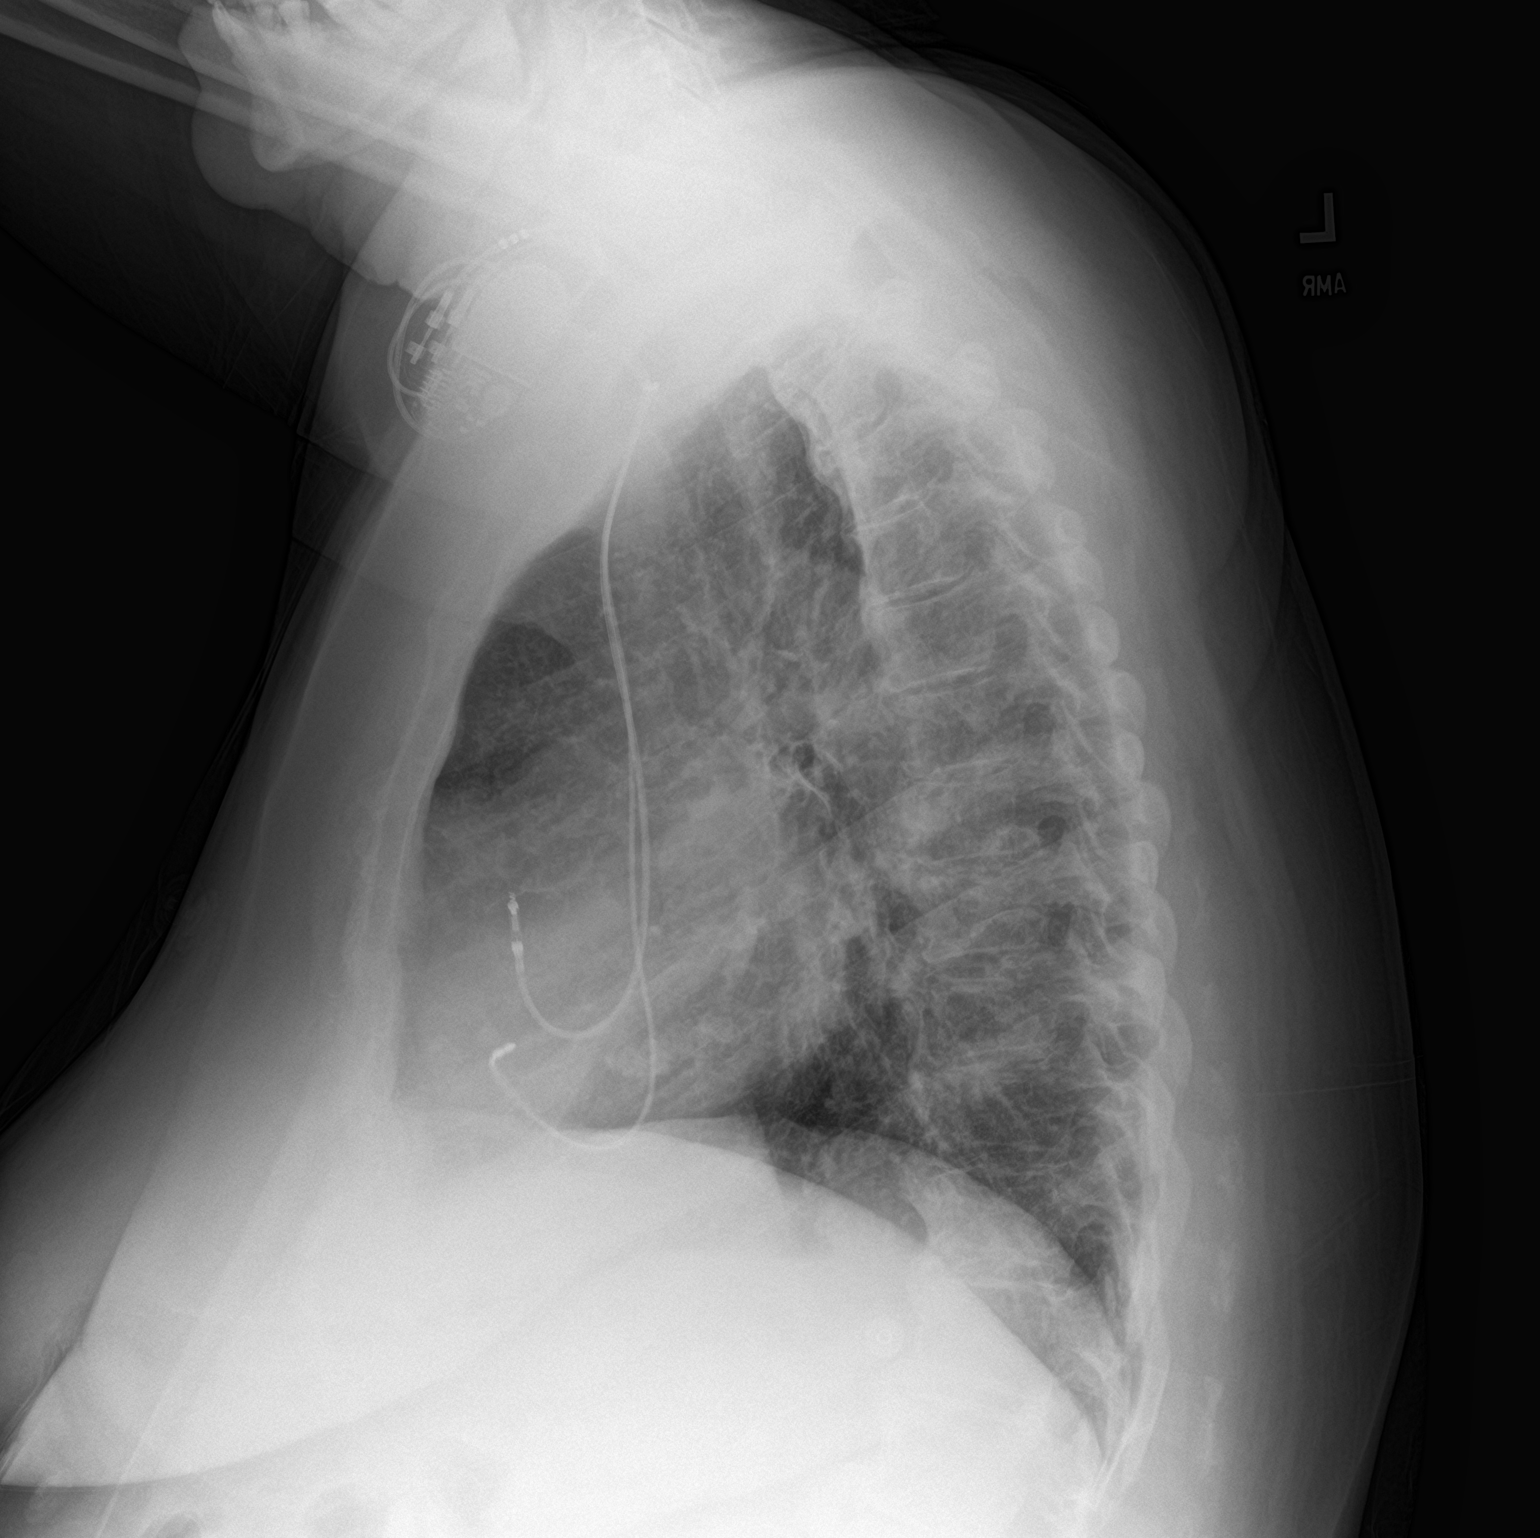

[2 of 2 positions shown; findings below may reference images not displayed]

FINDINGS: Cardiac silhouette is again mildly enlarged. Mediastinal contours
are within normal limits with mild calcification within aortic arch.
Left chest wall cardiac pacer is seen with leads overlying the right
atrium and right ventricle. Mild chronic interstitial thickening
bilaterally. Mild cephalization of the pulmonary vasculature. No
pleural effusion or pneumothorax. Moderate multilevel degenerative
disc changes of the thoracic spine.
IMPRESSION: No significant change. Mild cephalization of the pulmonary
vasculature as can be seen with central pulmonary venous
hypertension. No acute lung process.

## 2022-03-18 MED ORDER — MORPHINE SULFATE (PF) 4 MG/ML IV SOLN
4.0000 mg | Freq: Once | INTRAVENOUS | Status: AC
  Administered 2022-03-18: 4 mg via INTRAVENOUS
  Filled 2022-03-18: qty 1

## 2022-03-18 MED ORDER — ONDANSETRON HCL 4 MG/2ML IJ SOLN
4.0000 mg | Freq: Once | INTRAMUSCULAR | Status: AC
Start: 2022-03-18 — End: 2022-03-18
  Administered 2022-03-18: 4 mg via INTRAVENOUS
  Filled 2022-03-18: qty 2

## 2022-03-18 MED ORDER — HYDROMORPHONE HCL 1 MG/ML IJ SOLN
1.0000 mg | Freq: Once | INTRAMUSCULAR | Status: AC
  Administered 2022-03-18: 1 mg via INTRAVENOUS
  Filled 2022-03-18: qty 1

## 2022-03-18 MED ORDER — ONDANSETRON 4 MG PO TBDP
4.0000 mg | ORAL_TABLET | Freq: Once | ORAL | Status: AC
  Administered 2022-03-18: 4 mg via ORAL
  Filled 2022-03-18: qty 1

## 2022-03-18 NOTE — Telephone Encounter (Signed)
Pt c/o BP issue: STAT if pt c/o blurred vision, one-sided weakness or slurred speech  1. What are your last 5 BP readings? 209/96 right now,   2. Are you having any other symptoms (ex. Dizziness, headache, blurred vision, passed out)? Headache, little bit of dizziness, and SOB  3. What is your BP issue? Patient's BP is very high this morning   Pt c/o of Chest Pain: STAT if CP now or developed within 24 hours  1. Are you having CP right now? yes  2. Are you experiencing any other symptoms (ex. SOB, nausea, vomiting, sweating)? SOB  3. How long have you been experiencing CP? Started yesterday  4. Is your CP continuous or coming and going? continuous  5. Have you taken Nitroglycerin? No   Patient states she is having chest pain and her BP s very high. She states her home health nurse will be there at 1:30 pm today.   ?

## 2022-03-18 NOTE — ED Notes (Signed)
To X-Ray

## 2022-03-18 NOTE — Discharge Instructions (Signed)
Follow-up with your primary care doctor on Monday as planned.  Call Dr. Harl Bowie and see if they can see you in the office before mid July.  Continue taking Tylenol for pain, do not discontinue the Eliquis or any of your other pain medicine.  Your work-up today was reassuring, I think this is most likely an muscle wall pain.

## 2022-03-18 NOTE — ED Notes (Signed)
Pt had a pacemaker placed on May 25th; has had pain in neck and shoulder ever since pacemaker placed

## 2022-03-18 NOTE — ED Triage Notes (Signed)
Pt in from home via [facility] c/o mid radiating CP to L neck worse with palpation onset yesterday, pt hx of a fib with pacemaker on 5/25 ([facility] [facility]), denies v/d, c/o SOB and nausea, pt c/o dizziness, A&O x4

## 2022-03-18 NOTE — Telephone Encounter (Signed)
Patient states she has 7/10 SSCP with radiation to her neck. She has slight SOB and nausea.She is home alone. Onset within the hour. I advised her to go the ED.She is going to call 911.    I will FYI Dr.Branch

## 2022-03-18 NOTE — ED Provider Notes (Signed)
[facility] EMERGENCY DEPARTMENT Provider Note   CSN: [id number] Arrival date & time: 03/18/22  1321     History  Chief Complaint  Patient presents with   Chest Pain    Laura [name] is a 61 y.o. female.   Chest Pain    Patient with extensive cardiac history including atrial fibrillation on Eliquis, pacemaker, CHF, hypothyroid, neuropathy, type 2 diabetes, hypertension, hyperlipidemia, asthma presents today due to chest pain shortness of breath.  Chest pain started last night, its constant.  She felt it in the left side of her chest and it radiates to the left side of her neck.  Associated with nausea but no vomiting.  Worse with inspiration, worse with moving of the upper extremities no lower extremity swelling.  Denies any presyncope or syncope since pacemaker was implemented.  Home Medications Prior to Admission medications   Medication Sig Start Date End Date Taking? Authorizing Provider  acetaminophen (TYLENOL) 325 MG tablet Take 2 tablets (650 mg total) by mouth every 6 (six) hours as needed for mild pain, fever or headache. 08/11/18   Roxan Hockey, MD  albuterol (PROVENTIL) (2.5 MG/3ML) 0.083% nebulizer solution Take 3 mLs (2.5 mg total) by nebulization every 4 (four) hours as needed for wheezing or shortness of breath. 04/07/21   Althea Charon, FNP  albuterol (VENTOLIN HFA) 108 (90 Base) MCG/ACT inhaler INHALE 2 PUFFS INTO THE LUNGS EVERY 6 HOURS AS NEEDED FOR WHEEZING OR SHORTNESS OF BREATH 04/07/21   Althea Charon, FNP  apixaban (ELIQUIS) 5 MG TABS tablet Take 1 tablet (5 mg total) by mouth 2 (two) times daily. 07/14/21   Arnoldo Lenis, MD  atorvastatin (LIPITOR) 10 MG tablet Take 10 mg by mouth at bedtime.    [provider]  BD PEN NEEDLE NANO 2ND GEN 32G X 4 MM MISC 1 each by Other route in the morning, at noon, in the evening, and at bedtime. 05/20/20   [provider]  Benralizumab (FASENRA PEN) 30 MG/ML SOAJ Inject 1 mL (30 mg total) into the  skin every 28 (twenty-eight) days. Then every 8 weeks 02/15/22   Ambs, Kathrine Cords, FNP  blood glucose meter kit and supplies 1 each by Other route 4 (four) times daily. Dispense based on patient and insurance preference. Use up to four times daily as directed. (FOR ICD-10 E10.9, E11.9). 01/27/21   Cassandria Anger, MD  Cholecalciferol (VITAMIN D3) 125 MCG (5000 UT) CAPS Take 1 capsule (5,000 Units total) by mouth daily. 11/25/20   Cassandria Anger, MD  Continuous Blood Gluc Sensor (FREESTYLE LIBRE 3 SENSOR) MISC 1 Piece by Does not apply route every 14 (fourteen) days. Place 1 sensor on the skin every 14 days. Use to check glucose continuously 10/28/21   Cassandria Anger, MD  CUVITRU 10 GM/50ML SOLN Inject 25 mg into the skin every 14 (fourteen) days. 01/24/22   [provider]  diltiazem (CARDIZEM CD) 180 MG 24 hr capsule Take 1 capsule (180 mg total) by mouth daily. 02/18/22   [name], MD  divalproex (DEPAKOTE ER) 500 MG 24 hr tablet Take 1 tablet (500 mg total) by mouth daily AND 2 tablets (1,000 mg total) at bedtime. 01/26/22 04/26/22  [name], MD  Dulaglutide (TRULICITY) 3 OZ/3.0QM SOPN Inject 3 mg as directed once a week. Patient taking differently: Inject 1.5 mg as directed once a week. 02/24/22   Cassandria Anger, MD  ELDERBERRY PO Take 4 g by mouth daily. Gummies 2g each  [provider]  EPINEPHrine 0.3 mg/0.3 mL IJ SOAJ injection Inject 0.3 mg into the muscle once as needed for anaphylaxis. 01/17/20   [provider]  ferrous sulfate 324 MG TBEC Take 324 mg by mouth every evening.    [provider]  fluticasone (FLOVENT HFA) 110 MCG/ACT inhaler Inhale 2 puffs into the lungs 2 (two) times daily. 12/31/21   Ambs, Kathrine Cords, FNP  fluticasone furoate-vilanterol (BREO ELLIPTA) 100-25 MCG/ACT AEPB INHALE 1 PUFF INTO LUNGS DAILY (BULK) 03/18/22   Rigoberto Noel, MD  gabapentin (NEURONTIN) 600 MG tablet Take 600 mg by mouth 3 (three) times daily.  07/20/21   [provider]  hydrOXYzine (ATARAX/VISTARIL) 25 MG tablet Take 1 tablet (25 mg total) by mouth every 6 (six) hours as needed for anxiety or nausea. 08/11/18   Roxan Hockey, MD  Immune Globulin, Human, (CUVITRU [place]) Inject 25 mg into the skin every 14 (fourteen) days. 12/24/21   [provider]  insulin glargine (LANTUS SOLOSTAR) 100 UNIT/ML Solostar Pen Inject 80 Units into the skin at bedtime. 02/24/22   Cassandria Anger, MD  Insulin Pen Needle (PEN NEEDLES 3/16") 31G X 5 MM MISC Use as directed with insulin pen 10/21/21   Cassandria Anger, MD  levalbuterol [facility] HFA) 45 MCG/ACT inhaler Inhale 2 puffs into the lungs every 6 (six) hours as needed. 02/10/22   [provider]  levalbuterol Penne Lash) 1.25 MG/3ML nebulizer solution Inhale 1.25 mg into the lungs daily as needed (asthma). 02/09/22   [provider]  levothyroxine (SYNTHROID, LEVOTHROID) 112 MCG tablet Take 112 mcg by mouth daily before breakfast.    [provider]  lidocaine-prilocaine (EMLA) cream Apply 1 application. topically as needed [facility]). 12/03/21   [provider]  linaclotide Rolan Lipa) 145 MCG CAPS capsule Take 1 capsule (145 mcg total) by mouth daily before breakfast. Office visit needed before further refills 02/11/22   Mahala Menghini, PA-C  losartan (COZAAR) 25 MG tablet Take 1 tablet (25 mg total) by mouth daily. Patient taking differently: Take 50 mg by mouth daily. 02/18/22   [name], MD  MELATONIN GUMMIES PO Take 10 mg by mouth at bedtime.    [provider]  metFORMIN (GLUCOPHAGE) 500 MG tablet Take 500 mg by mouth 2 (two) times daily. 07/21/21   [provider]  metoCLOPramide (REGLAN) 5 MG tablet Take 5 mg by mouth 3 (three) times daily. 04/05/21   [provider]  montelukast (SINGULAIR) 10 MG tablet Take 10 mg by mouth at bedtime. 11/23/20   [provider]  Multiple Vitamins-Minerals (MULTIVITAMIN WITH  MINERALS) tablet Take 1 tablet by mouth daily. Woman    [provider]  Nebulizer MISC Nebulizer tubing kit 10/21/20   Valentina Shaggy, MD  omeprazole (PRILOSEC) 20 MG capsule Take 1 capsule (20 mg total) by mouth 2 (two) times daily before a meal. 12/31/21   Ambs, Kathrine Cords, FNP  ondansetron (ZOFRAN-ODT) 8 MG disintegrating tablet Take 8 mg by mouth daily as needed for vomiting or nausea.    [provider]  [facility] ULTRA test strip 1 each by Other route in the morning, at noon, in the evening, and at bedtime. 10/17/19   [provider]  oxyCODONE-acetaminophen (PERCOCET) 5-325 MG tablet Take 1 tablet by mouth every 6 (six) hours as needed. 03/14/22   Milton Ferguson, MD  polyethylene glycol (MIRALAX / GLYCOLAX) 17 g packet Take 17 g by mouth daily as needed. Patient taking differently: Take  17 g by mouth daily as needed for moderate constipation or severe constipation. 04/17/21   [place] Donnamarie Poag, DO  Potassium Chloride ER 20 MEQ TBCR Take 20 mEq by mouth daily. 01/04/21   [provider]  Spacer/Aero-Holding Chambers (AEROCHAMBER PLUS) inhaler 1 each by Other route as needed for other. Use as instructed 02/07/22   Ambs, Kathrine Cords, FNP  topiramate (TOPAMAX) 100 MG tablet Take 1 tablet (100 mg total) by mouth 2 (two) times daily. 08/05/21   Tat, Eustace Quail, DO  torsemide (DEMADEX) 20 MG tablet Take 1 tablet daily for fluid gain of 3 lb overnight or 5 lbs in a week. 01/25/22   Arnoldo Lenis, MD  triamcinolone cream (KENALOG) 0.1 % Apply 1 application. topically daily as needed (foot). 03/15/21   [provider]  vitamin B-12 (CYANOCOBALAMIN) 1000 MCG tablet Take 1,000 mcg by mouth daily.    [provider]  vitamin C (ASCORBIC ACID) 500 MG tablet Take 500 mg by mouth daily. Power C immune support    [provider]      Allergies    Ativan [lorazepam] and Phenergan [promethazine hcl]    Review of Systems   Review of Systems  Cardiovascular:   Positive for chest pain.    Physical Exam Updated Vital Signs BP 136/85   Pulse 74   Temp 98.5 F (36.9 C) (Oral)   Resp 16   Ht _0  (1.626 m)   Wt 97.5 kg   SpO2 100%   BMI 36.90 kg/m  Physical Exam Vitals and nursing note reviewed. Exam conducted with a chaperone present.  Constitutional:      Appearance: Normal appearance.  HENT:     Head: Normocephalic and atraumatic.     Mouth/Throat:     Mouth: Mucous membranes are dry.  Eyes:     General: No scleral icterus.       Right eye: No discharge.        Left eye: No discharge.     Extraocular Movements: Extraocular movements intact.     Pupils: Pupils are equal, round, and reactive to light.  Cardiovascular:     Rate and Rhythm: Normal rate. Rhythm irregular.     Pulses: Normal pulses.     Heart sounds: Normal heart sounds. No murmur heard.    No friction rub. No gallop.  Pulmonary:     Effort: Pulmonary effort is normal. No respiratory distress.     Breath sounds: Normal breath sounds.  Chest:     Chest wall: Tenderness present.     Comments: Reproducible chest wall tenderness. Abdominal:     General: Abdomen is flat. Bowel sounds are normal. There is no distension.     Palpations: Abdomen is soft.     Tenderness: There is no abdominal tenderness.  Musculoskeletal:     Right lower leg: Edema present.     Left lower leg: Edema present.  Skin:    General: Skin is warm and dry.     Coloration: Skin is not jaundiced.  Neurological:     Mental Status: She is alert. Mental status is at baseline.     Coordination: Coordination normal.     ED Results / Procedures / Treatments   Labs (all labs ordered are listed, but only abnormal results are displayed) Labs Reviewed  BASIC METABOLIC PANEL - Abnormal; Notable for the following components:      Result Value   Chloride 114 (*)    CO2 19 (*)  BUN 21 (*)    All other components within normal limits  CBG MONITORING, ED - Abnormal; Notable for the following  components:   Glucose-Capillary 60 (*)    All other components within normal limits  CBC  TROPONIN I (HIGH SENSITIVITY)  TROPONIN I (HIGH SENSITIVITY)    EKG EKG Interpretation  Date/Time:  Friday March 18 2022 13:37:51 EDT Ventricular Rate:  80 PR Interval:    QRS Duration: 88 QT Interval:  396 QTC Calculation: 456 R Axis:   86 Text Interpretation: Atrial flutter with variable A-V block with occasional ventricular-paced complexes and with premature ventricular or aberrantly conducted complexes Septal infarct , age undetermined Abnormal ECG When compared with ECG of 14-Mar-2022 15:45, PREVIOUS ECG IS PRESENT Confirmed by Thamas Jaegers ([id number]) on 03/18/2022 4:39:22 PM  Radiology CT Chest Wo Contrast  Result Date: 03/18/2022 CLINICAL DATA:  Chest pain EXAM: CT CHEST WITHOUT CONTRAST TECHNIQUE: Multidetector CT imaging of the chest was performed following the standard protocol without IV contrast. RADIATION DOSE REDUCTION: This exam was performed according to the departmental dose-optimization program which includes automated exposure control, adjustment of the mA and/or kV according to patient size and/or use of iterative reconstruction technique. COMPARISON:  CT a chest 01/31/2022 FINDINGS: Cardiovascular: The heart size is normal. No substantial pericardial effusion. Coronary artery calcification is evident. Mild atherosclerotic calcification is noted in the wall of the thoracic aorta. Mitral annular calcification evident. Mediastinum/Nodes: No mediastinal lymphadenopathy. No evidence for gross hilar lymphadenopathy although assessment is limited by the lack of intravenous contrast on the current study. The esophagus has normal imaging features. There is no axillary lymphadenopathy. Lungs/Pleura: No suspicious pulmonary nodule or mass. No focal airspace consolidation. There is no evidence of pleural effusion. Upper Abdomen: Unremarkable. Musculoskeletal: Gas in the subcutaneous fat of the left upper  anterior abdominal wall is presumably secondary to injections. No worrisome lytic or sclerotic osseous abnormality. IMPRESSION: No acute findings in the chest. Specifically, no findings to explain the patient's history of chest pain. Gas in the anterior subcutaneous fat of the upper abdominal wall is presumably related to injections. Aortic Atherosclerosis (ICD10-I70.0). Electronically Signed   By: Misty Stanley M.D.   On: 03/18/2022 18:09   DG Chest 2 View  Result Date: 03/18/2022 CLINICAL DATA:  Chest pain since yesterday. Pacemaker put in 2 weeks ago. EXAM: CHEST - 2 VIEW COMPARISON:  AP chest 03/14/2022, chest two views 02/07/2022 FINDINGS: Cardiac silhouette is again mildly enlarged. Mediastinal contours are within normal limits with mild calcification within aortic arch. Left chest wall cardiac pacer is seen with leads overlying the right atrium and right ventricle. Mild chronic interstitial thickening bilaterally. Mild cephalization of the pulmonary vasculature. No pleural effusion or pneumothorax. Moderate multilevel degenerative disc changes of the thoracic spine. IMPRESSION: No significant change. Mild cephalization of the pulmonary vasculature as can be seen with central pulmonary venous hypertension. No acute lung process. Electronically Signed   By: Yvonne Kendall M.D.   On: 03/18/2022 14:55   CUP PACEART INCLINIC DEVICE CHECK  Result Date: 03/17/2022 Wound check appointment. Steri-strips removed. Wound without redness or edema. Incision edges approximated, wound well healed. Normal device function. Thresholds, sensing, and impedances consistent with implant measurements. Device programmed at 3.5V/auto capture programmed on for extra safety margin until 3 month visit. Histogram distribution appropriate for patient and level of activity. 99% AT/AF + Eliquis V rates >110bpm 75% of the time No high ventricular rates noted. Patient educated about  wound care, arm mobility, lifting restrictions. ROV  06/02/22 with Jari Sportsman BSN,RN,CCDS   Procedures Procedures    Medications Ordered in ED Medications  ondansetron [facility]) injection 4 mg (4 mg Intravenous Given 03/18/22 1457)  morphine (PF) 4 MG/ML injection 4 mg (4 mg Intravenous Given 03/18/22 1457)  HYDROmorphone (DILAUDID) injection 1 mg (1 mg Intravenous Given 03/18/22 1858)  ondansetron (ZOFRAN-ODT) disintegrating tablet 4 mg (4 mg Oral Given 03/18/22 1856)    ED Course/ Medical Decision Making/ A&P                           Medical Decision Making Amount and/or Complexity of Data Reviewed Labs: ordered. Radiology: ordered.  Risk Prescription drug management.   Patient presents due to chest pain.  Differential includes but not limited to ACS, PE, pneumonia, pericarditis, pleural effusion, pericardial effusion, postoperative pain.  Presentation is complicated by recent pacemaker implant on 03/04/2022.  On exam her vitals are stable, she is not hypoxic and her pulse is irregular but consistent with her atrial fibrillation history.  She is on Eliquis, has not missed any doses.  I ordered and reviewed laboratory work-up.  Negative delta troponin, BMP is without gross electrolyte derangement or AKI.  CBC without leukocytosis or anemia.  Patient's chest x-ray shows recent pacemaker implant with leads in place, there is mild cephalization of the pulmonary vasculature which can be consistent with pulmonary hypertension.  No acute processes otherwise noted.  EKG shows atrial fibrillation with paced rhythm.  There is no ischemic findings.  I ordered the patient morphine and Zofran for pain.  On reevaluation patient is still complaining of pain.  This is worse with movement, coughing and deep breathing.  Given recent pacemaker procedure and the fact is on Eliquis we will proceed with CT chest to evaluate for possible pericardial effusion or hemorrhage.  CT scan viewed by myself.  There is no signs of acute hemorrhage or injury to  the chest wall.  I agree with radiologist interpretation.  I considered PE but I think this is unlikely given no hypoxia, no tachycardia or signs on the EKG, no signs of right heart strain on work-up.  Additionally, patient is already on Eliquis.  Pain is reproducible.  I will have the patient follow-up with cardiology, she also has a primary care appointment on Monday for reevaluation which I encouraged her to keep.  Patient's pain improved on reevaluation, I viewed the cardiac monitoring which showed rate of 74 bpm.  BP 136/85   Pulse 74   Temp 98.5 F (36.9 C) (Oral)   Resp 16   Ht _0  (1.626 m)   Wt 97.5 kg   SpO2 100%   BMI 36.90 kg/m   Discussed HPI, physical exam and plan of care for this patient with attending [facility]. The attending physician evaluated this patient as part of a shared visit and agrees with plan of care.         Final Clinical Impression(s) / ED Diagnoses Final diagnoses:  Chest wall pain    Rx / DC Orders ED Discharge Orders     None         Sherrill Raring, Hershal Coria 03/18/22 Toney Sang, MD 03/19/22 1434

## 2022-03-21 DIAGNOSIS — E782 Mixed hyperlipidemia: Secondary | ICD-10-CM | POA: Diagnosis not present

## 2022-03-21 DIAGNOSIS — I5032 Chronic diastolic (congestive) heart failure: Secondary | ICD-10-CM | POA: Diagnosis not present

## 2022-03-21 DIAGNOSIS — I480 Paroxysmal atrial fibrillation: Secondary | ICD-10-CM | POA: Diagnosis not present

## 2022-03-21 DIAGNOSIS — I10 Essential (primary) hypertension: Secondary | ICD-10-CM | POA: Diagnosis not present

## 2022-03-21 DIAGNOSIS — G4730 Sleep apnea, unspecified: Secondary | ICD-10-CM | POA: Diagnosis not present

## 2022-03-21 DIAGNOSIS — E1140 Type 2 diabetes mellitus with diabetic neuropathy, unspecified: Secondary | ICD-10-CM | POA: Diagnosis not present

## 2022-03-21 DIAGNOSIS — E1165 Type 2 diabetes mellitus with hyperglycemia: Secondary | ICD-10-CM | POA: Diagnosis not present

## 2022-03-21 DIAGNOSIS — I442 Atrioventricular block, complete: Secondary | ICD-10-CM | POA: Diagnosis not present

## 2022-03-21 NOTE — Telephone Encounter (Signed)
Speak with the pharmacist. She did receive Depakote, on 4/11 for 90 days (total of 270 tabs). She should have enough medication to last until her next appointment. Please review with the patient to ensure that she takes medication correctly. I have not ordered any hydroxyzine to her in the past.

## 2022-03-21 NOTE — Telephone Encounter (Signed)
pt called left a message that she needs a refill on the hydroxyzine and [facility]akote [facility]

## 2022-03-23 ENCOUNTER — Telehealth: Payer: Self-pay | Admitting: Cardiology

## 2022-03-23 DIAGNOSIS — E1142 Type 2 diabetes mellitus with diabetic polyneuropathy: Secondary | ICD-10-CM | POA: Diagnosis not present

## 2022-03-23 DIAGNOSIS — J4551 Severe persistent asthma with (acute) exacerbation: Secondary | ICD-10-CM | POA: Diagnosis not present

## 2022-03-23 DIAGNOSIS — I5032 Chronic diastolic (congestive) heart failure: Secondary | ICD-10-CM | POA: Diagnosis not present

## 2022-03-23 DIAGNOSIS — I4821 Permanent atrial fibrillation: Secondary | ICD-10-CM | POA: Diagnosis not present

## 2022-03-23 DIAGNOSIS — J449 Chronic obstructive pulmonary disease, unspecified: Secondary | ICD-10-CM | POA: Diagnosis not present

## 2022-03-23 DIAGNOSIS — I110 Hypertensive heart disease with heart failure: Secondary | ICD-10-CM | POA: Diagnosis not present

## 2022-03-23 NOTE — Telephone Encounter (Signed)
Spoke to pt who stated that she saw PCP after hospital visit who d/c losartan and added Olmesartan 40 mg tablets to her medications. Pt would like to know if provider agrees with this changes. Pt also was notified that provider does not have any openings until November/December and she could be scheduled to see Gerrianne Scale, PA-C in [place] on 06/20. Pt would like to keep f/u with Dr. Harl Bowie.

## 2022-03-23 NOTE — Telephone Encounter (Signed)
Pt is requesting call back in regards to the recent hospital visit and med changes that were given. She also would like to know if she should schedule for a sooner appt than the one she has scheduled for 06/27. Please advice.

## 2022-03-23 NOTE — Telephone Encounter (Signed)
Losartan and olmesartan are in the same family of medications, I'm fine with change. ER evaluation reviewed and fortunately did not show any worrisome causes for her symptoms. Keep current June appt. If still having some palpitations could start back her bisoprolol 2.'5mg'$  daily   Zandra Abts MD

## 2022-03-24 ENCOUNTER — Ambulatory Visit (INDEPENDENT_AMBULATORY_CARE_PROVIDER_SITE_OTHER): Payer: Medicare Other | Admitting: Clinical

## 2022-03-24 DIAGNOSIS — F4310 Post-traumatic stress disorder, unspecified: Secondary | ICD-10-CM | POA: Diagnosis not present

## 2022-03-24 MED ORDER — BISOPROLOL FUMARATE 5 MG PO TABS
2.5000 mg | ORAL_TABLET | Freq: Every day | ORAL | 2 refills | Status: DC
Start: 2022-03-24 — End: 2022-04-05

## 2022-03-24 NOTE — Telephone Encounter (Signed)
Spoke to pt who verbalized understanding. Pt would like to start bisoprolol 2.5 mg tablets, prescription sent to [facility] per pt's request.

## 2022-03-24 NOTE — Progress Notes (Signed)
Virtual Visit via Video Note   I connected with Laura [name] on 03/24/2022 at  3:00 PM EST by a video enabled telemedicine application and verified that I am speaking with the correct person using two identifiers.   Location: Patient: Home Provider: Office   I discussed the limitations of evaluation and management by telemedicine and the availability of in person appointments. The patient expressed understanding and agreed to proceed.   THERAPIST PROGRESS NOTE   Session Time: 3:00 PM-:3:55 PM   Participation Level: Active   Behavioral Response: CasualAlertDepressed   Type of Therapy: Individual Therapy   Treatment Goals addressed: Coping   Interventions: CBT, DBT, Solution Focused, Strength-based and Supportive   Summary: Laura [name] is a 61 y.o. female who presents with PTSD. The OPT therapist worked with the patient for her ongoing OPT treatment session. The OPT therapist utilized Motivational Interviewing to assist in creating therapeutic repore.The patient in the session was engaged and work in collaboration giving feedback about her triggers and symptoms over the past few weeks.The patient spoke about the procedure and adjusting to now having a pacemaker. The patient identified as part of her health conditions she cannot currently drive which has been a stressor which making it to other in person scheduled health appointments, however, noted she may be cleared to drive as early as July. The OPT therapist utilized Cognitive Behavioral Therapy through cognitive restructuring as well as worked with the patient on coping strategies to assist in management of mental health symptoms. The patient verbalized, " I have follow up appointments scheduled with Cardio, the shortness of breath has improved since the procedure, when they do the cardioversion if it takes me out of A fib this will help my shortness of breath". The patient will continue to work to lose weight as well for her health.  The patient currently has in home health aids who come 2x per day in monitorin the patients vitals.The patient is currently working to plan a visit with her daughter at the end of August into September if she can get the ok to travel from her providers.The OPT therapist placed emphasis on the patient keeping all upcoming health appointments and adhering to ongoing health treatment recommendations.    Suicidal/Homicidal: Nowithout intent/plan   Therapist Response:The OPT therapist worked with the patient for the patients scheduled session. The patient was engaged in her session and gave feedback in relation to triggers, symptoms, and behavior responses over the past few weeks. The OPT therapist worked with the patient utilizing an in session Cognitive Behavioral Therapy exercise.  The patient spoke about being hopeful that she will be able to get her driving privileges re-instated, and get clearance to travel to go see her daughter at the end of August.The patient will continue to work with her health care providers including PCP, Psychiatrist, and Cardiologist.The OPT therapist will continue treatment work with the patient in her next scheduled session.   Plan: Return again in 2/3 weeks.   Diagnosis:      Axis I:Post Traumatic Stress Disorder                           Axis II: No diagnosis     Collaboration of Care: No additional collaboration for this scheduled session.   Patient/Guardian was advised Release of Information must be obtained prior to any record release in order to collaborate their care with an outside provider. Patient/Guardian was advised if they have not already  done so to contact the registration department to sign all necessary forms in order for [place] to release information regarding their care.    Consent: Patient/Guardian gives verbal consent for treatment and assignment of benefits for services provided during this visit. Patient/Guardian expressed understanding and agreed to  proceed.      I discussed the assessment and treatment plan with the patient. The patient was provided an opportunity to ask questions and all were answered. The patient agreed with the plan and demonstrated an understanding of the instructions.   The patient was advised to call back or seek an in-person evaluation if the symptoms worsen or if the condition fails to improve as anticipated.   I provided 55 minutes of non-face-to-face time during this encounter.   [name], LCSW   03/24/2022

## 2022-03-25 ENCOUNTER — Encounter: Payer: Self-pay | Admitting: Allergy & Immunology

## 2022-03-25 ENCOUNTER — Ambulatory Visit (INDEPENDENT_AMBULATORY_CARE_PROVIDER_SITE_OTHER): Payer: Medicare Other | Admitting: Allergy & Immunology

## 2022-03-25 ENCOUNTER — Ambulatory Visit (HOSPITAL_COMMUNITY): Payer: Medicare Other | Admitting: Clinical

## 2022-03-25 VITALS — BP 138/70 | HR 65 | Temp 98.7°F | Ht 63.0 in | Wt 216.4 lb

## 2022-03-25 DIAGNOSIS — D839 Common variable immunodeficiency, unspecified: Secondary | ICD-10-CM

## 2022-03-25 DIAGNOSIS — J4550 Severe persistent asthma, uncomplicated: Secondary | ICD-10-CM | POA: Diagnosis not present

## 2022-03-25 DIAGNOSIS — R918 Other nonspecific abnormal finding of lung field: Secondary | ICD-10-CM

## 2022-03-25 DIAGNOSIS — J310 Chronic rhinitis: Secondary | ICD-10-CM | POA: Diagnosis not present

## 2022-03-25 MED ORDER — HYDROXYZINE HCL 25 MG PO TABS: 25.0000 mg | ORAL_TABLET | Freq: Three times a day (TID) | ORAL | 5 refills | Status: AC | PRN
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
## 2022-03-25 NOTE — Patient Instructions (Addendum)
1. Hypogammaglobulinemia - Continue with Cuvitru.  - We will continue with 25 g every 2 weeks. - IgG level was excellent and the rest of your labs looked good as well (aside from the hemoglobin A1c which is IMPROVING).   2. Chronic rhinitis   - Continue with nasal saline rinses as needed.  - You seem to have a good sense of your symptoms.  3. Severe persistent asthma, uncomplicated - Lung function not done since you had the recent cardiac procedure.  - We will get this at the next visit.  - We will not make any medication changes at this time. - Daily medication: Breo 164mg one puff once daily + Fasenra every 8 weeks - Prior to physical activity: ProAir 2 puffs 10-15 minutes before physical activity. - Rescue medications: ProAir 4 puffs every 4-6 hours as needed, albuterol nebulizer one vial every 4-6 hours as needed or DuoNeb nebulizer one vial every 4-6 hours as needed - Asthma control goals:  * Full participation in all desired activities (may need albuterol before activity) * Albuterol use two time or less a week on average (not counting use with activity) * Cough interfering with sleep two time or less a month * Oral steroids no more than once a year * No hospitalizations.  4. Return in about 6 months (around 09/24/2022).    Please inform [place] any Emergency Department visits, hospitalizations, or changes in symptoms. Call [place]efore going to the ED for breathing or allergy symptoms since we might be able to fit you in for a sick visit. Feel free to contact u[place]anytime with any questions, problems, or concerns.  It was a pleasure to see you again today!  Websites that have reliable patient information: 1. American Academy of Asthma, Allergy, and Immunology: www.aaaai.org 2. Food Allergy Research and Education (FARE): foodallergy.org 3. Mothers of Asthmatics: http://www.asthmacommunitynetwork.org 4. American College of Allergy, Asthma, and Immunology: www.acaai.org   COVID-19  Vaccine Information can be found at: [email or web address] questions related to vaccine distribution or appointments, please email [email or web address] or call [phone]     "Like" [facility] Facebook and Instagram for our latest updates!       Make sure you are registered to vote! If you have moved or changed any of your contact information, you will need to get this updated before voting!  In some cases, you MAY be able to register to vote online: [email or web address]

## 2022-03-29 DIAGNOSIS — I110 Hypertensive heart disease with heart failure: Secondary | ICD-10-CM | POA: Diagnosis not present

## 2022-03-29 DIAGNOSIS — J4551 Severe persistent asthma with (acute) exacerbation: Secondary | ICD-10-CM | POA: Diagnosis not present

## 2022-03-29 DIAGNOSIS — J449 Chronic obstructive pulmonary disease, unspecified: Secondary | ICD-10-CM | POA: Diagnosis not present

## 2022-03-29 DIAGNOSIS — I4821 Permanent atrial fibrillation: Secondary | ICD-10-CM | POA: Diagnosis not present

## 2022-03-29 DIAGNOSIS — E1142 Type 2 diabetes mellitus with diabetic polyneuropathy: Secondary | ICD-10-CM | POA: Diagnosis not present

## 2022-03-29 DIAGNOSIS — I5032 Chronic diastolic (congestive) heart failure: Secondary | ICD-10-CM | POA: Diagnosis not present

## 2022-03-30 DIAGNOSIS — E1165 Type 2 diabetes mellitus with hyperglycemia: Secondary | ICD-10-CM | POA: Diagnosis not present

## 2022-03-31 DIAGNOSIS — J449 Chronic obstructive pulmonary disease, unspecified: Secondary | ICD-10-CM | POA: Diagnosis not present

## 2022-03-31 DIAGNOSIS — D839 Common variable immunodeficiency, unspecified: Secondary | ICD-10-CM | POA: Diagnosis not present

## 2022-03-31 DIAGNOSIS — E1165 Type 2 diabetes mellitus with hyperglycemia: Secondary | ICD-10-CM | POA: Diagnosis not present

## 2022-03-31 DIAGNOSIS — I110 Hypertensive heart disease with heart failure: Secondary | ICD-10-CM | POA: Diagnosis not present

## 2022-03-31 DIAGNOSIS — I5032 Chronic diastolic (congestive) heart failure: Secondary | ICD-10-CM | POA: Diagnosis not present

## 2022-03-31 DIAGNOSIS — I10 Essential (primary) hypertension: Secondary | ICD-10-CM | POA: Diagnosis not present

## 2022-03-31 DIAGNOSIS — J4551 Severe persistent asthma with (acute) exacerbation: Secondary | ICD-10-CM | POA: Diagnosis not present

## 2022-03-31 DIAGNOSIS — E1142 Type 2 diabetes mellitus with diabetic polyneuropathy: Secondary | ICD-10-CM | POA: Diagnosis not present

## 2022-03-31 DIAGNOSIS — E782 Mixed hyperlipidemia: Secondary | ICD-10-CM | POA: Diagnosis not present

## 2022-03-31 DIAGNOSIS — E039 Hypothyroidism, unspecified: Secondary | ICD-10-CM | POA: Diagnosis not present

## 2022-03-31 DIAGNOSIS — I4821 Permanent atrial fibrillation: Secondary | ICD-10-CM | POA: Diagnosis not present

## 2022-03-31 LAB — LIPID PANEL
Cholesterol: 138 (ref 0–200)
HDL: 44 (ref 35–70)
LDL Cholesterol: 78
Triglycerides: 82 (ref 40–160)

## 2022-03-31 LAB — HEPATIC FUNCTION PANEL
ALT: 13 U/L (ref 7–35)
AST: 10 — AB (ref 13–35)
Alkaline Phosphatase: 72 (ref 25–125)
Bilirubin, Total: 0.3

## 2022-03-31 LAB — COMPREHENSIVE METABOLIC PANEL
Albumin: 4.3 (ref 3.5–5.0)
Calcium: 9.3 (ref 8.7–10.7)
Globulin: 2.6

## 2022-03-31 LAB — BASIC METABOLIC PANEL
BUN: 20 (ref 4–21)
CO2: 20 (ref 13–22)
Chloride: 108 (ref 99–108)
Creatinine: 0.7 (ref 0.5–1.1)
Glucose: 105
Potassium: 4.4 mEq/L (ref 3.5–5.1)
Sodium: 142 (ref 137–147)

## 2022-03-31 LAB — TSH: TSH: 1.54 (ref 0.41–5.90)

## 2022-04-01 ENCOUNTER — Telehealth: Payer: Self-pay | Admitting: "Endocrinology

## 2022-04-04 DIAGNOSIS — E1142 Type 2 diabetes mellitus with diabetic polyneuropathy: Secondary | ICD-10-CM | POA: Diagnosis not present

## 2022-04-04 DIAGNOSIS — J449 Chronic obstructive pulmonary disease, unspecified: Secondary | ICD-10-CM | POA: Diagnosis not present

## 2022-04-04 DIAGNOSIS — I110 Hypertensive heart disease with heart failure: Secondary | ICD-10-CM | POA: Diagnosis not present

## 2022-04-04 DIAGNOSIS — I4821 Permanent atrial fibrillation: Secondary | ICD-10-CM | POA: Diagnosis not present

## 2022-04-04 DIAGNOSIS — I5032 Chronic diastolic (congestive) heart failure: Secondary | ICD-10-CM | POA: Diagnosis not present

## 2022-04-04 DIAGNOSIS — J4551 Severe persistent asthma with (acute) exacerbation: Secondary | ICD-10-CM | POA: Diagnosis not present

## 2022-04-05 ENCOUNTER — Encounter: Payer: Self-pay | Admitting: Cardiology

## 2022-04-05 ENCOUNTER — Ambulatory Visit (INDEPENDENT_AMBULATORY_CARE_PROVIDER_SITE_OTHER): Payer: Medicare Other | Admitting: Cardiology

## 2022-04-05 VITALS — BP 124/72 | HR 60 | Ht 64.0 in | Wt 218.2 lb

## 2022-04-05 DIAGNOSIS — I10 Essential (primary) hypertension: Secondary | ICD-10-CM | POA: Diagnosis not present

## 2022-04-05 DIAGNOSIS — I495 Sick sinus syndrome: Secondary | ICD-10-CM | POA: Diagnosis not present

## 2022-04-05 DIAGNOSIS — I5032 Chronic diastolic (congestive) heart failure: Secondary | ICD-10-CM | POA: Diagnosis not present

## 2022-04-05 DIAGNOSIS — R55 Syncope and collapse: Secondary | ICD-10-CM | POA: Diagnosis not present

## 2022-04-05 DIAGNOSIS — R0789 Other chest pain: Secondary | ICD-10-CM

## 2022-04-05 DIAGNOSIS — I480 Paroxysmal atrial fibrillation: Secondary | ICD-10-CM | POA: Diagnosis not present

## 2022-04-05 MED ORDER — BISOPROLOL FUMARATE 5 MG PO TABS: 5.0000 mg | ORAL_TABLET | Freq: Every day | ORAL | 3 refills | Status: DC

## 2022-04-05 NOTE — Progress Notes (Signed)
Clinical Summary Laura [name] is a 61 y.o.female seen today for follow up of the following medical problems.      1.Chest pain  08/2019 cath LV end diastolic pressure is mildly elevated. There is no aortic valve stenosis. Aortic atherosclerosis/calcification noted. No angiographically apparently CAD.   -ER visit 03/18/22 with constant chest pain worst with position, from notes reproducible on exam - trops neg, EKG no specific ischemic changes, CT chest benign.  - sitting in living sharp left sided, 6/10 in severity. No other associated symptoms. Radiated in left shoulder and neck. Pain worst with movement. Pain would come and go. Able sleep. Woke recurrent symptoms, checked bp and it was elevated to 200. Tender to palpation.   -mild chest pain left chest over the last few days, tender to palpation      2.Orthostatic syncope - prior issues with orthostatic syncope - at prior office visit so severely orthostatic could not get up from exam table, EMS called - have cut back her diuretic, bp meds  Depakote 12-25% dizziness, 1-5% orthostatic hypotension.  Gabapentin dizziness 17-28%  Imipramine: orthostatic hypotension Topamax: dizziness 14% Reglan: dizziness Jan 2023 HgbA1c 7.8, previously 8    - no change depakote at least 1 year, gabapentin was increased about 6 months, no changes in topamax,    - no recent dizziness. - has not used torsemide at all.   2. Persistent afib/Tachy brady - admission with cardiogenic syncope, bradycardia - weaned av nodal agents. Issues with tachy and brady/pauses - had pacemaker placed 03/03/22 - given palpitations we restarted her bisoprolol at 2.5mg  on 03/24/22 - ongoing palpitations, not improved.  -no bleeding on eliquis.       3. HTN - compliant with meds - careful management given issues with orthostatic hypotension in the past    4 LE edema/Chronic diastolic HF - 08/2019 echo LVEF 60-65%, elevated LVEDP, mild RV dysfunction,  mild to mod LAE,  11/2020 echo LVEF 65-70%, indet diastolic, low normal RV function - we changed torsemide to just prn given prior orthostatic symptoms, has not needed in some time.         5. Prior ASD repair   6. Asthma/Bronchitis - recent admission 04/2021 - followed by Dr Vassie Loll   7. COVID + in Jan 2022 - recurrent infection 05/2021 Past Medical History:  Diagnosis Date   Asthma    Atrial fibrillation (HCC)    Bipolar disorder (HCC)    Bronchiectasis (HCC)    Carpal tunnel syndrome of right wrist    CHF (congestive heart failure) (HCC)    Complication of anesthesia    "my Dr in [place] told me I had an allergic reaction to the combination of the pain meds and the anesthesia" she does not remember what happened but "I eneded up in the ICU for 5 days".   H/O congenital atrial septal defect (ASD) repair    Hypogammaglobulinemia (HCC)    Hypothyroid    IgE deficiency (HCC)    Neuropathy    Neuropathy    Osteoarthritis    Pinched nerve in neck    PTSD (post-traumatic stress disorder)    Recurrent sinusitis    Short-term memory loss    Sleep apnea    TIA (transient ischemic attack)    Tremors of nervous system    seeing PCP for this.   Type 2 diabetes mellitus (HCC)    Urticaria    Wound infection      Allergies  Allergen Reactions  Ativan [Lorazepam] Hives   Phenergan [Promethazine Hcl] Hives     Current Outpatient Medications  Medication Sig Dispense Refill   acetaminophen (TYLENOL) 325 MG tablet Take 2 tablets (650 mg total) by mouth every 6 (six) hours as needed for mild pain, fever or headache. 30 tablet 1   albuterol (PROVENTIL) (2.5 MG/3ML) 0.083% nebulizer solution Take 3 mLs (2.5 mg total) by nebulization every 4 (four) hours as needed for wheezing or shortness of breath. 75 mL 1   albuterol (VENTOLIN HFA) 108 (90 Base) MCG/ACT inhaler INHALE 2 PUFFS INTO THE LUNGS EVERY 6 HOURS AS NEEDED FOR WHEEZING OR SHORTNESS OF BREATH 18 g 1   apixaban (ELIQUIS) 5  MG TABS tablet Take 1 tablet (5 mg total) by mouth 2 (two) times daily. 180 tablet 3   atorvastatin (LIPITOR) 10 MG tablet Take 10 mg by mouth at bedtime.     BD PEN NEEDLE NANO 2ND GEN 32G X 4 MM MISC 1 each by Other route in the morning, at noon, in the evening, and at bedtime.     Benralizumab (FASENRA PEN) 30 MG/ML SOAJ Inject 1 mL (30 mg total) into the skin every 28 (twenty-eight) days. Then every 8 weeks 1 mL 8   bisoprolol (ZEBETA) 5 MG tablet Take 0.5 tablets (2.5 mg total) by mouth daily. 45 tablet 2   blood glucose meter kit and supplies 1 each by Other route 4 (four) times daily. Dispense based on patient and insurance preference. Use up to four times daily as directed. (FOR ICD-10 E10.9, E11.9). 1 each 0   Cholecalciferol (VITAMIN D3) 125 MCG (5000 UT) CAPS Take 1 capsule (5,000 Units total) by mouth daily. 90 capsule 0   Continuous Blood Gluc Sensor (FREESTYLE LIBRE 3 SENSOR) MISC 1 Piece by Does not apply route every 14 (fourteen) days. Place 1 sensor on the skin every 14 days. Use to check glucose continuously 6 each 2   CUVITRU 10 GM/50ML SOLN Inject 25 mg into the skin every 14 (fourteen) days.     diltiazem (CARDIZEM CD) 180 MG 24 hr capsule Take 1 capsule (180 mg total) by mouth daily. 90 capsule 1   divalproex (DEPAKOTE ER) 500 MG 24 hr tablet Take 1 tablet (500 mg total) by mouth daily AND 2 tablets (1,000 mg total) at bedtime. 270 tablet 0   Dulaglutide (TRULICITY) 3 MG/0.5ML SOPN Inject 3 mg as directed once a week. (Patient taking differently: Inject 1.5 mg as directed once a week.) 6 mL 1   ELDERBERRY PO Take 4 g by mouth daily. Gummies 2g each     EPINEPHrine 0.3 mg/0.3 mL IJ SOAJ injection Inject 0.3 mg into the muscle once as needed for anaphylaxis.     ferrous sulfate 324 MG TBEC Take 324 mg by mouth every evening.     fluticasone (FLOVENT HFA) 110 MCG/ACT inhaler Inhale 2 puffs into the lungs 2 (two) times daily. 1 each 5   fluticasone furoate-vilanterol (BREO ELLIPTA)  100-25 MCG/ACT AEPB INHALE 1 PUFF INTO LUNGS DAILY (BULK) 60 each 5   gabapentin (NEURONTIN) 600 MG tablet Take 600 mg by mouth 3 (three) times daily.     hydrOXYzine (ATARAX) 25 MG tablet Take 1 tablet (25 mg total) by mouth 3 (three) times daily as needed for anxiety or itching. 30 tablet 5   Immune Globulin, Human, (CUVITRU [place]) Inject 25 mg into the skin every 14 (fourteen) days.     insulin glargine (LANTUS SOLOSTAR) 100 UNIT/ML Solostar Pen Inject  80 Units into the skin at bedtime. 30 mL 2   Insulin Pen Needle (PEN NEEDLES 3/16") 31G X 5 MM MISC Use as directed with insulin pen 100 each 0   levalbuterol (XOPENEX HFA) 45 MCG/ACT inhaler Inhale 2 puffs into the lungs every 6 (six) hours as needed.     levalbuterol (XOPENEX) 1.25 MG/3ML nebulizer solution Inhale 1.25 mg into the lungs daily as needed (asthma).     levothyroxine (SYNTHROID, LEVOTHROID) 112 MCG tablet Take 112 mcg by mouth daily before breakfast.     lidocaine-prilocaine (EMLA) cream Apply 1 application. topically as needed [facility]).     linaclotide (LINZESS) 145 MCG CAPS capsule Take 1 capsule (145 mcg total) by mouth daily before breakfast. Office visit needed before further refills 90 capsule 1   losartan (COZAAR) 25 MG tablet Take 1 tablet (25 mg total) by mouth daily. (Patient taking differently: Take 50 mg by mouth daily.) 90 tablet 1   MELATONIN GUMMIES PO Take 10 mg by mouth at bedtime.     metFORMIN (GLUCOPHAGE) 500 MG tablet Take 500 mg by mouth 2 (two) times daily.     metoCLOPramide (REGLAN) 5 MG tablet Take 5 mg by mouth 3 (three) times daily.     montelukast (SINGULAIR) 10 MG tablet Take 10 mg by mouth at bedtime.     Multiple Vitamins-Minerals (MULTIVITAMIN WITH MINERALS) tablet Take 1 tablet by mouth daily. Woman     Nebulizer MISC Nebulizer tubing kit 2 each 5   olmesartan (BENICAR) 40 MG tablet Take 40 mg by mouth daily.     omeprazole (PRILOSEC) 20 MG capsule Take 1 capsule (20 mg total) by mouth 2 (two) times  daily before a meal. 30 capsule 5   ondansetron (ZOFRAN-ODT) 8 MG disintegrating tablet Take 8 mg by mouth daily as needed for vomiting or nausea.     ONETOUCH ULTRA test strip 1 each by Other route in the morning, at noon, in the evening, and at bedtime.     oxyCODONE-acetaminophen (PERCOCET) 5-325 MG tablet Take 1 tablet by mouth every 6 (six) hours as needed. 10 tablet 0   polyethylene glycol (MIRALAX / GLYCOLAX) 17 g packet Take 17 g by mouth daily as needed. (Patient taking differently: Take 17 g by mouth daily as needed for moderate constipation or severe constipation.) 14 each 0   Potassium Chloride ER 20 MEQ TBCR Take 20 mEq by mouth daily.     Spacer/Aero-Holding Chambers (AEROCHAMBER PLUS) inhaler 1 each by Other route as needed for other. Use as instructed 1 each 0   topiramate (TOPAMAX) 100 MG tablet Take 1 tablet (100 mg total) by mouth 2 (two) times daily. 180 tablet 2   torsemide (DEMADEX) 20 MG tablet Take 1 tablet daily for fluid gain of 3 lb overnight or 5 lbs in a week. (Patient taking differently: as needed. Take 1 tablet daily for fluid gain of 3 lb overnight or 5 lbs in a week.) 90 tablet 3   triamcinolone cream (KENALOG) 0.1 % Apply 1 application. topically daily as needed (foot).     vitamin B-12 (CYANOCOBALAMIN) 1000 MCG tablet Take 1,000 mcg by mouth daily.     vitamin C (ASCORBIC ACID) 500 MG tablet Take 500 mg by mouth daily. Power C immune support     Current Facility-Administered Medications  Medication Dose Route Frequency Provider Last Rate Last Admin   Benralizumab SOSY 30 mg  30 mg Subcutaneous Q28 days Ambs, Norvel Richards, FNP   30 mg at  12/27/21 1603     Past Surgical History:  Procedure Laterality Date   ASD REPAIR  1968   BIOPSY  01/26/2021   Procedure: BIOPSY;  Surgeon: Lanelle Bal, DO;  Location: [facility];  Service: Endoscopy;;  gastric   CARDIAC SURGERY     CARDIOVERSION     CARDIOVERSION N/A 08/29/2018   Procedure: CARDIOVERSION;  Surgeon:  Antoine Poche, MD;  Location: [facility];  Service: Endoscopy;  Laterality: N/A;   CESAREAN SECTION  1986, 1988, 1994   COLONOSCOPY WITH PROPOFOL N/A 01/26/2021   Procedure: COLONOSCOPY WITH PROPOFOL;  Surgeon: Lanelle Bal, DO;  Location: [facility];  Service: Endoscopy;  Laterality: N/A;  am appt, diabetic   ESOPHAGOGASTRODUODENOSCOPY (EGD) WITH PROPOFOL N/A 01/26/2021   Procedure: ESOPHAGOGASTRODUODENOSCOPY (EGD) WITH PROPOFOL;  Surgeon: Lanelle Bal, DO;  Location: [facility];  Service: Endoscopy;  Laterality: N/A;   LEFT HEART CATH AND CORONARY ANGIOGRAPHY N/A 08/30/2019   Procedure: LEFT HEART CATH AND CORONARY ANGIOGRAPHY;  Surgeon: Corky Crafts, MD;  Location: [facility] INVASIVE CV LAB;  Service: Cardiovascular;  Laterality: N/A;   NASAL SINUS SURGERY  2017   PACEMAKER IMPLANT N/A 03/03/2022   Procedure: PACEMAKER IMPLANT;  Surgeon: Marinus Maw, MD;  Location: [facility] INVASIVE CV LAB;  Service: Cardiovascular;  Laterality: N/A;   POLYPECTOMY  01/26/2021   Procedure: POLYPECTOMY;  Surgeon: Lanelle Bal, DO;  Location: [facility];  Service: Endoscopy;;  colon     Allergies  Allergen Reactions   Ativan [Lorazepam] Hives   Phenergan [Promethazine Hcl] Hives      Family History  Problem Relation Age of Onset   Hypertension Mother    Stroke Mother    Kidney disease Mother    Heart attack Mother    Alcohol abuse Mother    Other Father        ?colon cancer   Kidney disease Sister    Heart disease Brother    Stroke Maternal Aunt    Heart disease Maternal Aunt    Hypertension Sister    Bipolar disorder Sister    Thyroid disease Sister    Prostate cancer Brother    Thyroid disease Daughter    Hashimoto's thyroiditis Daughter    Celiac disease Daughter    Allergic rhinitis Daughter    Asthma Neg Hx      Social History Laura [name] reports that she quit smoking about 15 years ago. Her smoking use included cigarettes. She started smoking about 43  years ago. She has a 28.00 pack-year smoking history. She has never used smokeless tobacco. Laura [name] reports that she does not currently use alcohol.   Review of Systems CONSTITUTIONAL: No weight loss, fever, chills, weakness or fatigue.  HEENT: Eyes: No visual loss, blurred vision, double vision or yellow sclerae.No hearing loss, sneezing, congestion, runny nose or sore throat.  SKIN: No rash or itching.  CARDIOVASCULAR: per hpi RESPIRATORY: No shortness of breath, cough or sputum.  GASTROINTESTINAL: No anorexia, nausea, vomiting or diarrhea. No abdominal pain or blood.  GENITOURINARY: No burning on urination, no polyuria NEUROLOGICAL: No headache, dizziness, syncope, paralysis, ataxia, numbness or tingling in the extremities. No change in bowel or bladder control.  MUSCULOSKELETAL: No muscle, back pain, joint pain or stiffness.  LYMPHATICS: No enlarged nodes. No history of splenectomy.  PSYCHIATRIC: No history of depression or anxiety.  ENDOCRINOLOGIC: No reports of sweating, cold or heat intolerance. No polyuria or polydipsia.  Marland Kitchen   Physical Examination  Today's Vitals   04/05/22 1332  BP: 124/72  Pulse: 60  SpO2: 98%  Weight: 218 lb 3.2 oz (99 kg)  Height: 5\' 4"  (1.626 m)   Body mass index is 37.45 kg/m.  Gen: resting comfortably, no acute distress HEENT: no scleral icterus, pupils equal round and reactive, no palptable cervical adenopathy,  CV: irreg, no m/r/g no jvd Resp: Clear to auscultation bilaterally GI: abdomen is soft, non-tender, non-distended, normal bowel sounds, no hepatosplenomegaly MSK: extremities are warm, no edema.  Skin: warm, no rash Neuro:  no focal deficits Psych: appropriate affect   Diagnostic Studies 08/2019 cath LV end diastolic pressure is mildly elevated. There is no aortic valve stenosis. Aortic atherosclerosis/calcification noted. No angiographically apparently CAD.     08/2019 echo 1. Left ventricular ejection fraction, by visual  estimation, is 60 to  65%. The left ventricle has normal function. There is mildly increased  left ventricular hypertrophy.   2. Elevated left ventricular end-diastolic pressure.   3. Left ventricular diastolic function could not be evaluated.   4. Global right ventricle has mildly reduced systolic function.The right  ventricular size is normal. No increase in right ventricular wall  thickness.   5. Left atrial size was mild-moderately dilated.   6. Right atrial size was normal.   7. Moderate to severe mitral annular calcification.   8. The mitral valve is degenerative. Trace mitral valve regurgitation.   9. The tricuspid valve is grossly normal. Tricuspid valve regurgitation  is trivial.  10. The aortic valve is tricuspid. Aortic valve regurgitation is not  visualized. Mild aortic valve sclerosis without stenosis.  11. The pulmonic valve was grossly normal. Pulmonic valve regurgitation is  not visualized.  12. The inferior vena cava is normal in size with greater than 50%  respiratory variability, suggesting right atrial pressure of 3 mmHg.      Assessment and Plan   1.Chest pain - recent symptoms not consistent with cardiac chest pain, likely MSK - if recurrent consider course or prednsione given cannot take NSAIDs on eliquis  2. Tachy brady/afib - some ongoing palpitations, increase bisoprolol to 5mg  daily. Prior to issues with brady had been on dilt up to 360mg . Now that she has pacemaker can titrate again if needed - has had 4 prior cardioversions - she mentions Dr Ladona Ridgel had discussed starting her on a medication and retrying cardioversion but I do not see that in his note. Work toward rate control at this time, if remains symptomatic touch base with EP to see if this would be the plan  3. Chronic diastolic HF - doing well, not requiring prn torsemide at all - continue to monitor  4. Orthostatic syncope - no recent issues, improved with cutting back diuretic - continue  to monitor  5. HTN - above goal based on home numbers, see how it does with increasing bisoprolol. Likely increase dilt next if ongoing palpitations and bp remains high   F/u 3 months    Antoine Poche, M.D.

## 2022-04-06 ENCOUNTER — Telehealth: Payer: Self-pay | Admitting: Cardiology

## 2022-04-06 ENCOUNTER — Encounter: Payer: Self-pay | Admitting: Cardiology

## 2022-04-06 DIAGNOSIS — J449 Chronic obstructive pulmonary disease, unspecified: Secondary | ICD-10-CM | POA: Diagnosis not present

## 2022-04-06 DIAGNOSIS — I4821 Permanent atrial fibrillation: Secondary | ICD-10-CM | POA: Diagnosis not present

## 2022-04-06 DIAGNOSIS — J4551 Severe persistent asthma with (acute) exacerbation: Secondary | ICD-10-CM | POA: Diagnosis not present

## 2022-04-06 DIAGNOSIS — I5032 Chronic diastolic (congestive) heart failure: Secondary | ICD-10-CM | POA: Diagnosis not present

## 2022-04-06 DIAGNOSIS — E1142 Type 2 diabetes mellitus with diabetic polyneuropathy: Secondary | ICD-10-CM | POA: Diagnosis not present

## 2022-04-06 DIAGNOSIS — I110 Hypertensive heart disease with heart failure: Secondary | ICD-10-CM | POA: Diagnosis not present

## 2022-04-06 NOTE — Telephone Encounter (Signed)
   Pt said, she forgot to ask a question to dr.Branch when he saw him yesterday and would like to speak with a nurse to discuss

## 2022-04-06 NOTE — Telephone Encounter (Signed)
Patient has several questions for Dr.Branch.Can she fly later this summer, does she need cardiac rehab, etc. She is going to list all of her questions via MyChart message

## 2022-04-07 ENCOUNTER — Other Ambulatory Visit: Payer: Self-pay | Admitting: Family Medicine

## 2022-04-07 DIAGNOSIS — M2041 Other hammer toe(s) (acquired), right foot: Secondary | ICD-10-CM | POA: Diagnosis not present

## 2022-04-07 DIAGNOSIS — G4730 Sleep apnea, unspecified: Secondary | ICD-10-CM | POA: Diagnosis not present

## 2022-04-07 DIAGNOSIS — I442 Atrioventricular block, complete: Secondary | ICD-10-CM | POA: Diagnosis not present

## 2022-04-07 DIAGNOSIS — I10 Essential (primary) hypertension: Secondary | ICD-10-CM | POA: Diagnosis not present

## 2022-04-07 DIAGNOSIS — E782 Mixed hyperlipidemia: Secondary | ICD-10-CM | POA: Diagnosis not present

## 2022-04-07 DIAGNOSIS — I480 Paroxysmal atrial fibrillation: Secondary | ICD-10-CM | POA: Diagnosis not present

## 2022-04-07 DIAGNOSIS — E1165 Type 2 diabetes mellitus with hyperglycemia: Secondary | ICD-10-CM | POA: Diagnosis not present

## 2022-04-07 DIAGNOSIS — E1140 Type 2 diabetes mellitus with diabetic neuropathy, unspecified: Secondary | ICD-10-CM | POA: Diagnosis not present

## 2022-04-07 DIAGNOSIS — I5032 Chronic diastolic (congestive) heart failure: Secondary | ICD-10-CM | POA: Diagnosis not present

## 2022-04-08 NOTE — Telephone Encounter (Signed)
Ok to fly, if there several weeks and not inconvenient would take monitor with her. Let me look into the rehab part more but wanted to answer today so she can get her flight   Zandra Abts MD
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
## 2022-04-13 ENCOUNTER — Ambulatory Visit (INDEPENDENT_AMBULATORY_CARE_PROVIDER_SITE_OTHER): Payer: Medicare Other | Admitting: Clinical

## 2022-04-13 ENCOUNTER — Telehealth: Payer: Self-pay | Admitting: Cardiology

## 2022-04-13 DIAGNOSIS — I5032 Chronic diastolic (congestive) heart failure: Secondary | ICD-10-CM | POA: Diagnosis not present

## 2022-04-13 DIAGNOSIS — E1142 Type 2 diabetes mellitus with diabetic polyneuropathy: Secondary | ICD-10-CM | POA: Diagnosis not present

## 2022-04-13 DIAGNOSIS — F4310 Post-traumatic stress disorder, unspecified: Secondary | ICD-10-CM

## 2022-04-13 DIAGNOSIS — J4551 Severe persistent asthma with (acute) exacerbation: Secondary | ICD-10-CM | POA: Diagnosis not present

## 2022-04-13 DIAGNOSIS — J449 Chronic obstructive pulmonary disease, unspecified: Secondary | ICD-10-CM | POA: Diagnosis not present

## 2022-04-13 DIAGNOSIS — I4821 Permanent atrial fibrillation: Secondary | ICD-10-CM | POA: Diagnosis not present

## 2022-04-13 DIAGNOSIS — I110 Hypertensive heart disease with heart failure: Secondary | ICD-10-CM | POA: Diagnosis not present

## 2022-04-13 NOTE — Telephone Encounter (Signed)
Patient was advised to call back in today to let us know if she was still having palpitations.  She states she is still having them.   Her BP has been: 7/3 170/66  HR 61 7/4 152/64 HR 62 7/5 128/72 HR 95   Pt c/o swelling: STAT is pt has developed SOB within 24 hours  If swelling, where is the swelling located? Hands and feet is swollen.   How much weight have you gained and in what time span? Gained 5 lbs in over 3 days   Have you gained 3 pounds in a day or 5 pounds in a week? Gained 5 lbs in over 3 days   Do you have a log of your daily weights (if so, list)?   Are you currently taking a fluid pill? no  Are you currently SOB? She is always SOB   Have you traveled recently? No Patient states she has been eating well, even with the holiday.  She didn't go anywhere she has been home.

## 2022-04-13 NOTE — Progress Notes (Signed)
Virtual Visit via Video Note   I connected with Laura [name] on 04/13/2022 at  10:00 AM EST by a video enabled telemedicine application and verified that I am speaking with the correct person using two identifiers.   Location: Patient: Home Provider: Office   I discussed the limitations of evaluation and management by telemedicine and the availability of in person appointments. The patient expressed understanding and agreed to proceed.   THERAPIST PROGRESS NOTE   Session Time: 10:00 AM-:10:55 AM   Participation Level: Active   Behavioral Response: CasualAlertDepressed   Type of Therapy: Individual Therapy   Treatment Goals addressed: Coping   Interventions: CBT, DBT, Solution Focused, Strength-based and Supportive   Summary: Laura [name] is a 61 y.o. female who presents with PTSD. The OPT therapist worked with the patient for her ongoing OPT treatment session. The OPT therapist utilized Motivational Interviewing to assist in creating therapeutic repore.The patient in the session was engaged and work in collaboration giving feedback about her triggers and symptoms over the past few weeks.The patient spoke about her experience and getting out for the 4th of July holiday. The patient has a check in with her Cardiologist Dr. Harl Bowie later today. The patient may need a cardioversion and she is working with Dr.Taylor and Dr. Harl Bowie from her [facility] team. The patient notes she has gained some weight, but does not think its from her eating habits and feels its more from swelling. The OPT therapist utilized Cognitive Behavioral Therapy through cognitive restructuring as well as worked with the patient on coping strategies to assist in management of mental health symptoms. The patient verbalized, " I went for the first time and got out some since my surgery and we celebrated on July 3rd at a place I go camping in [place], [place]".  The patient is currently working to get into a cardiovascular  gym.The patient is currently working to plan a visit with her daughter at the end of August into September if she can get the ok to travel from her providers. The patient spoke about going to her daughter who lives in [place] and being ask to watch her grandchildren this being a huge change due to the patients history with her daughter from [place]. The patient noted, " This is such a big change over the last year such an improvement".  The patient continues to try to be active in her limits with walking and gardening.The OPT therapist placed emphasis on the patient keeping all upcoming health appointments and adhering to ongoing health treatment recommendations.    Suicidal/Homicidal: Nowithout intent/plan   Therapist Response:The OPT therapist worked with the patient for the patients scheduled session. The patient was engaged in her session and gave feedback in relation to triggers, symptoms, and behavior responses over the past few weeks. The OPT therapist worked with the patient utilizing an in session Cognitive Behavioral Therapy exercise.  The patient spoke about  her ongoing difficulty with her A-fib and ongoing health monitoring. The patient spoke about her being hopeful that she will be able to get her driving privileges re-instated, and get clearance to travel to go see her daughter at the end of August/ September.The patient will continue to work with her health care providers including PCP, Psychiatrist, and Cardiologist. The patient has not been cleared as of yet to be able to regain her driving privileges due to her health problem. The patient has gotten permission to be able to fly which will allow her to visit her daughter in  [place] most likely in September. The OPT therapist will continue treatment work with the patient in her next scheduled session.   Plan: Return again in 2/3 weeks.   Diagnosis:      Axis I:Post Traumatic Stress Disorder                           Axis II: No diagnosis      Collaboration of Care: No additional collaboration for this scheduled session.   Patient/Guardian was advised Release of Information must be obtained prior to any record release in order to collaborate their care with an outside provider. Patient/Guardian was advised if they have not already done so to contact the registration department to sign all necessary forms in order for [place] to release information regarding their care.    Consent: Patient/Guardian gives verbal consent for treatment and assignment of benefits for services provided during this visit. Patient/Guardian expressed understanding and agreed to proceed.      I discussed the assessment and treatment plan with the patient. The patient was provided an opportunity to ask questions and all were answered. The patient agreed with the plan and demonstrated an understanding of the instructions.   The patient was advised to call back or seek an in-person evaluation if the symptoms worsen or if the condition fails to improve as anticipated.   I provided 55 minutes of non-face-to-face time during this encounter.   [name], LCSW  04/13/2022

## 2022-04-14 ENCOUNTER — Encounter: Payer: Self-pay | Admitting: Psychiatry

## 2022-04-14 ENCOUNTER — Telehealth (INDEPENDENT_AMBULATORY_CARE_PROVIDER_SITE_OTHER): Payer: Medicare Other | Admitting: Psychiatry

## 2022-04-14 DIAGNOSIS — I4821 Permanent atrial fibrillation: Secondary | ICD-10-CM | POA: Diagnosis not present

## 2022-04-14 DIAGNOSIS — E1142 Type 2 diabetes mellitus with diabetic polyneuropathy: Secondary | ICD-10-CM | POA: Diagnosis not present

## 2022-04-14 DIAGNOSIS — F3341 Major depressive disorder, recurrent, in partial remission: Secondary | ICD-10-CM | POA: Diagnosis not present

## 2022-04-14 DIAGNOSIS — J449 Chronic obstructive pulmonary disease, unspecified: Secondary | ICD-10-CM | POA: Diagnosis not present

## 2022-04-14 DIAGNOSIS — F0630 Mood disorder due to known physiological condition, unspecified: Secondary | ICD-10-CM

## 2022-04-14 DIAGNOSIS — I110 Hypertensive heart disease with heart failure: Secondary | ICD-10-CM | POA: Diagnosis not present

## 2022-04-14 DIAGNOSIS — D839 Common variable immunodeficiency, unspecified: Secondary | ICD-10-CM | POA: Diagnosis not present

## 2022-04-14 DIAGNOSIS — J4551 Severe persistent asthma with (acute) exacerbation: Secondary | ICD-10-CM | POA: Diagnosis not present

## 2022-04-14 DIAGNOSIS — I5032 Chronic diastolic (congestive) heart failure: Secondary | ICD-10-CM | POA: Diagnosis not present

## 2022-04-14 MED ORDER — DIVALPROEX SODIUM ER 500 MG PO TB24: ORAL_TABLET | ORAL | 0 refills | Status: DC

## 2022-04-14 MED ORDER — DILTIAZEM HCL ER COATED BEADS 240 MG PO CP24: 240.0000 mg | ORAL_CAPSULE | Freq: Every day | ORAL | 3 refills | Status: DC

## 2022-04-14 NOTE — Telephone Encounter (Signed)
Its not clear that she has a diagnosis that insurance would cover cardiac rehab. I am checking with the coordinator  Zandra Abts MD

## 2022-04-14 NOTE — Telephone Encounter (Signed)
Pt notified via mychart

## 2022-04-14 NOTE — Telephone Encounter (Signed)
Lets increase her diltiazem to '240mg'$  daily, update [place] again on MOnday   J Ayane Delancey MD

## 2022-04-14 NOTE — Patient Instructions (Signed)
   Continue Depakote 500 mg in AM, 1000 mg at night  Next appointment: 9/27 at 1:40

## 2022-04-14 NOTE — Telephone Encounter (Signed)
Patient notified and verbalized understanding.   Pt is questioning providers decision involving Cardiac Rehab and her ability to drive. Please advise.

## 2022-04-18 ENCOUNTER — Encounter: Payer: Self-pay | Admitting: Internal Medicine

## 2022-04-18 NOTE — Telephone Encounter (Signed)
Pt notified via mychart

## 2022-04-18 NOTE — Telephone Encounter (Signed)
Pt c/o BP issue: STAT if pt c/o blurred vision, one-sided weakness or slurred speech  1. What are your last 5 BP readings?  7/5 - 128/72 HR 95 7/6 - 146/66 HR 78 7/8 - 113/50 HR 61 7/9 - 119/47 HR 65 7/10- 133/61 HR 61   2. Are you having any other symptoms (ex. Dizziness, headache, blurred vision, passed out)? No  3. What is your BP issue? Pt was requested to call in with BP reading have med increase. She is also needing to get clarification on if she can continue working out and/or  driving.

## 2022-04-18 NOTE — Telephone Encounter (Signed)
BP's overall look fine. Clearance to drive would need to come from Dr Lovena Le who had initially discussed with her the time frame after pacemaker was placed   Zandra Abts MD

## 2022-04-21 ENCOUNTER — Telehealth: Payer: Self-pay

## 2022-04-21 DIAGNOSIS — J45909 Unspecified asthma, uncomplicated: Secondary | ICD-10-CM

## 2022-04-21 NOTE — Telephone Encounter (Signed)
-----   Message from Arnoldo Lenis, MD sent at 04/21/2022 12:42 PM EDT ----- Prior message if regarding [name] ----- Message ----- From: Arnoldo Lenis, MD Sent: 04/21/2022  12:42 PM EDT To: Berlinda Last, CMA  Would not qualify for cardiac rehab but possibly for pulmonary rehab. Similar exercise program however there is no cardiac monitoring and they meet few times per week. If she is interested please place referal to pulmonary rehab for asthma  Zandra Abts MD

## 2022-04-21 NOTE — Telephone Encounter (Signed)
Left a message for patient to call office back regarding cardiac/pulmonary rehab.

## 2022-04-21 NOTE — Telephone Encounter (Signed)
Patient notified and verbalized understanding. Pt agreeable with Pulmonary Rehab.    Patient is also questioning when she can have surgery on her foot. She is having more complications with it and states that she cannot wear a sneaker at the time.

## 2022-04-25 NOTE — Telephone Encounter (Signed)
Patient notified via My Chart

## 2022-04-25 NOTE — Telephone Encounter (Signed)
Can we refer to pulmonary rehab for asthma. Ok to proceed with foot surgery, I have messaged her podiatrist but she may also give him a call     Zandra Abts MD

## 2022-04-27 ENCOUNTER — Telehealth: Payer: Self-pay | Admitting: Podiatry

## 2022-04-27 NOTE — Telephone Encounter (Signed)
Pt left a voice message stating she has questions regarding her surgery and would like to speak with someone.   I attempted to call her back to see what her questions were and how I could direct her call, but she didn't answer. I left a voice message and am now waiting for her to return my call.   She didn't specify anything else in her message.

## 2022-04-28 DIAGNOSIS — D839 Common variable immunodeficiency, unspecified: Secondary | ICD-10-CM | POA: Diagnosis not present

## 2022-04-28 NOTE — Telephone Encounter (Signed)
Pt called back and wanted to let Dr Sherryle Lis know that she is wanting to postpone her surgery until after sept 16th as she has a trip on august 5th thru 13th and Sept 2nd thru 16th.  She was asking for an appt to see Dr Sherryle Lis for a injection and I have scheduled her for 7.25.2023.Marland KitchenMarland Kitchen

## 2022-04-29 DIAGNOSIS — J069 Acute upper respiratory infection, unspecified: Secondary | ICD-10-CM | POA: Diagnosis not present

## 2022-04-30 DIAGNOSIS — E1165 Type 2 diabetes mellitus with hyperglycemia: Secondary | ICD-10-CM | POA: Diagnosis not present

## 2022-05-03 ENCOUNTER — Ambulatory Visit (INDEPENDENT_AMBULATORY_CARE_PROVIDER_SITE_OTHER): Payer: Medicare Other | Admitting: Podiatry

## 2022-05-03 ENCOUNTER — Ambulatory Visit (INDEPENDENT_AMBULATORY_CARE_PROVIDER_SITE_OTHER): Payer: Medicare Other

## 2022-05-03 DIAGNOSIS — S99921D Unspecified injury of right foot, subsequent encounter: Secondary | ICD-10-CM

## 2022-05-03 DIAGNOSIS — M2041 Other hammer toe(s) (acquired), right foot: Secondary | ICD-10-CM

## 2022-05-03 DIAGNOSIS — M2011 Hallux valgus (acquired), right foot: Secondary | ICD-10-CM | POA: Diagnosis not present

## 2022-05-04 ENCOUNTER — Ambulatory Visit (INDEPENDENT_AMBULATORY_CARE_PROVIDER_SITE_OTHER): Payer: Medicare Other | Admitting: Clinical

## 2022-05-04 DIAGNOSIS — F4310 Post-traumatic stress disorder, unspecified: Secondary | ICD-10-CM

## 2022-05-04 NOTE — Progress Notes (Signed)
Subjective:  Patient ID: Laura [name], female    DOB: December 05, 1960,  MRN: [id number]  Chief Complaint  Patient presents with   Foot Pain    right great toe and middle toe pain/ req injection/ discuss postponed surgery- she now has a pacemaker and has been cleared for surgery. She would like to wait until mid to late September, after she returns from [place].    61 y.o. female presents with the above complaint. History confirmed with patient.  Her second toe and foot is still very painful for her.  She is still interested in surgical correction.  She had a pacemaker placed and she was cleared for surgery by her cardiologist.  Objective:  Physical Exam: warm, good capillary refill, no trophic changes or ulcerative lesions, normal DP and PT pulses, normal sensory exam, and on her right foot she has a hallux valgus deformity with a medial bunion and a reducible second hammertoe.  Mild pain on plantar plate  Radiographs: Multiple views x-ray of the right foot: Prior right foot radiographs reviewed she has moderate hallux valgus with abduction of the hallux increase in the intermetatarsal and hallux abductus angle and frontal plane rotation, she has an elongated second ray with hammertoe contracture of the second toe  MRI 10/18/2021: IMPRESSION: 1. Mild hammertoe morphology of the second toe, without definitive plantar plate tear at the second MTP joint. Intermediate signal within the lateral proper collateral ligament of the second MTP joint consistent with chronic low-grade injury/degeneration. 2. Probable degenerative plantar plate tear of the first MTP joint. Absent medial sesamoid of the first MTP with capsular thickening of the plantar great toe MTP medially. 3. Scattered mild interphalangeal joint degenerative arthritis. 4. No evidence of acute fracture. No acute tendon tear or significant tenosynovitis.     Electronically Signed   By: Maurine Simmering M.D.   On: 10/18/2021  13:06   Assessment:   1. Injury of plantar plate of right foot, subsequent encounter   2. Hammer toe of right foot   3. Hallux valgus of right foot      Plan:  Patient was evaluated and treated and all questions answered.  We again reviewed her radiographs and previous MRI results.  She still interested in surgical correction.  Surgical we discussed correcting her painful bunion deformity and second toe with double osteotomy of the metatarsal and proximal phalanx of the first ray, second metatarsal osteotomy and hammertoe correction.  She may need to require plantar plate repair directly or augmentation with flexor tendon transfer as well.  I discussed for now offloading the second MTPJ with a Budin splint and utilize the cam boot if necessary.  We discussed that an injection with possibility of a partial plantar plate tear could exacerbate this and cause the plantar plate rupture.  I recommended avoiding this for now.  We discussed the risk benefits and potential complications of surgery including but not limited to  pain, swelling, infection, scar, numbness which may be temporary or permanent, chronic pain, stiffness, nerve pain or damage, wound healing problems, bone healing problems including delayed or non-union.  We also discussed the risk of cardiac events due to her significant history.  No guarantees as the outcome of surgery could be made.  Informed consent was signed and reviewed.  All questions were addressed.   Surgical plan:  Procedure: -Right foot MIS bunionectomy, second hammertoe correction, plantar plate repair and/or flexor tendon transfer  Location: -[facility]  Anesthesia plan: -IV sedation with regional block  Postoperative  pain plan: - Tylenol 1000 mg every 6 hours, ibuprofen 600 mg every 6 hours, gabapentin 300 mg every 8 hours x5 days, oxycodone 5 mg 1-2 tabs every 6 hours only as needed  DVT prophylaxis: -She will resume her Eliquis after surgery, advised to  discontinue 3 days before surgery  WB Restrictions / DME needs: -WBAT in CAM boot which she has    Return for after surgery.

## 2022-05-04 NOTE — Progress Notes (Signed)
Virtual Visit via Video Note   I connected with [name] on 05/04/2022 at  10:00 AM EST by a video enabled telemedicine application and verified that I am speaking with the correct person using two identifiers.   Location: Patient: Home Provider: Office   I discussed the limitations of evaluation and management by telemedicine and the availability of in person appointments. The patient expressed understanding and agreed to proceed.   THERAPIST PROGRESS NOTE   Session Time: 10:00 AM-:10:45 AM   Participation Level: Active   Behavioral Response: CasualAlertDepressed   Type of Therapy: Individual Therapy   Treatment Goals addressed: Coping   Interventions: CBT, DBT, Solution Focused, Strength-based and Supportive   Summary: Laura [name] is a 61 y.o. female who presents with PTSD. The OPT therapist worked with the patient for her ongoing OPT treatment session. The OPT therapist utilized Motivational Interviewing to assist in creating therapeutic repore.The patient in the session was engaged and work in collaboration giving feedback about her triggers and symptoms over the past few weeks.The patient spoke about waiting to go to her daughters house in August in [place] and will be spending time with her daughter and grandchildren while in [place] and her other daughter in [place] in  September. The patient spoke about the ongoing changes and development with her relationship with her daughter [name]. The patient spoke about a upcoming foot surgery scheduled for September 22nd , she is currently in a foot brace. The patient has gotten clearance to be able to drive again from Dr. Lovena Le.The OPT therapist utilized Cognitive Behavioral Therapy through cognitive restructuring as well as worked with the patient on coping strategies to assist in management of mental health symptoms. The patient verbalized, " I have been isolating and feeling Depressed due to a triggering event due to  finding some storage boxes and one was my deceased husbands that we found when we went to go to a yard sale and it was triggering".  The OPT therapist continued to urge the patient  to try to be active in her limits and she has been walking and gardening and getting out driving more.including going to the splash pad(pool in [place]). The OPT therapist placed emphasis on the patient keeping all upcoming health appointments and adhering to ongoing health treatment recommendations. The patient has a appointment with pulmonologist tomorrow 05/05/2022.    Suicidal/Homicidal: Nowithout intent/plan   Therapist Response:The OPT therapist worked with the patient for the patients scheduled session. The patient was engaged in her session and gave feedback in relation to triggers, symptoms, and behavior responses over the past few weeks. The OPT therapist worked with the patient utilizing an in session Cognitive Behavioral Therapy exercise.  The patient spoke about  difficulty from being recently triggered from finding a storage box that had items of her late husband. The patient spoke about her plans to travel to go see her daughters in August 5th-13th and September 2nd-16th.The patient will continue to work with her health care providers including PCP, Psychiatrist, and Cardiologist. The patient was cleared to be able to regain her driving privileges and has been driving in the past few weeks. The patient has gotten permission to be able to fly which will allow her to visit her daughters in August and September.  The patient spoke about being active in utilizing her coping skills including working on puzzles, talking with family, gardening, and getting out of the home. The patient spoke about some recent controversy over the patients choices connected to  her will as to how her assets will be distributed. The patient had named one of her daughters as the power of attorney over her will. The OPT therapist will continue  treatment work with the patient in her next scheduled session.   Plan: Return again in 2/3 weeks.   Diagnosis:      Axis I:Post Traumatic Stress Disorder                           Axis II: No diagnosis     Collaboration of Care: No additional collaboration for this scheduled session.   Patient/Guardian was advised Release of Information must be obtained prior to any record release in order to collaborate their care with an outside provider. Patient/Guardian was advised if they have not already done so to contact the registration department to sign all necessary forms in order for [place] to release information regarding their care.    Consent: Patient/Guardian gives verbal consent for treatment and assignment of benefits for services provided during this visit. Patient/Guardian expressed understanding and agreed to proceed.      I discussed the assessment and treatment plan with the patient. The patient was provided an opportunity to ask questions and all were answered. The patient agreed with the plan and demonstrated an understanding of the instructions.   The patient was advised to call back or seek an in-person evaluation if the symptoms worsen or if the condition fails to improve as anticipated.   I provided 45 minutes of non-face-to-face time during this encounter.   [name], LCSW   05/04/2022

## 2022-05-05 ENCOUNTER — Other Ambulatory Visit: Payer: Self-pay

## 2022-05-05 ENCOUNTER — Encounter: Payer: Self-pay | Admitting: Pulmonary Disease

## 2022-05-05 ENCOUNTER — Ambulatory Visit (INDEPENDENT_AMBULATORY_CARE_PROVIDER_SITE_OTHER): Payer: Medicare Other | Admitting: Pulmonary Disease

## 2022-05-05 DIAGNOSIS — G4733 Obstructive sleep apnea (adult) (pediatric): Secondary | ICD-10-CM

## 2022-05-05 DIAGNOSIS — J479 Bronchiectasis, uncomplicated: Secondary | ICD-10-CM

## 2022-05-05 DIAGNOSIS — Z9989 Dependence on other enabling machines and devices: Secondary | ICD-10-CM

## 2022-05-05 DIAGNOSIS — J4550 Severe persistent asthma, uncomplicated: Secondary | ICD-10-CM

## 2022-05-05 MED ORDER — AMOXICILLIN-POT CLAVULANATE 875-125 MG PO TABS: 1.0000 | ORAL_TABLET | Freq: Two times a day (BID) | ORAL | 0 refills | Status: AC

## 2022-05-05 MED ORDER — PREDNISONE 10 MG PO TABS: ORAL_TABLET | ORAL | 0 refills | Status: DC

## 2022-05-05 MED ORDER — PREDNISONE 10 MG PO TABS: ORAL_TABLET | ORAL | 0 refills | Status: AC

## 2022-05-05 NOTE — Assessment & Plan Note (Signed)
Serial spirometry was reviewed she does show some fluctuation of FEV1 She will continue on Breo with Xopenex for rescue.  She will also continue on Fasenra injections

## 2022-05-05 NOTE — Progress Notes (Signed)
   Subjective:    Patient ID: Laura [name], female    DOB: [birth year], 61 y.o.   MRN: [id number]  HPI  61 yo female for FU of  bronchiectasis, CVID, OSA  Former smoker. 28 pack years. Quit in 2008. Allergy with Dr. Ernst Bowler - on subcu immunoglobulin injections q2weeks and Fasenra in [place] office.    PMH - ASD s/p repair, paroxysmal atrial fibrillation s/p DCCV, status post PPM for tachybradycardia syndrome insulin-dependent diabetes mellitus,  PTSD, bipolar disorder,  hypothyroidism, hyperlipidemia,  hypertension CVID   COVID infection Jan 2022 & again 05/2021  Chief Complaint  Patient presents with   Follow-up    Has chest congestion going on- finished prednisone and one more dose of doxy.     6 month FU  02/2022 hosp for syncope, atrial fib - tachy brady ,PPM placed Reviewed cardiology and EP follow-up reports .  She has started driving again  She developed chest congestion and was seen by PCP 7/21 and prescribed prednisone and doxycycline.  She reports persistent green mucus production.  We reviewed her sleep test from 2011.  She has an old CPAP machine download is not available she would also like new supplies  I reviewed allergy consultation and serial spirometry's   Significant tests/ events reviewed  CTA chest 03/2021 >> RT hilar LN 13 mm, no nodule   CTA 09/18/19 - No pulmonary embolism. Nodule ~63m in right lung base. Minimal right pleural effusion.   04/2021 esophagram >> small hiatal hernia  HST 03/2010 >> AHI 14/h   PFT: 02/2022 ratio 73, FEV1 75%, FVC 81% 11/2021 ratio 75, FEV1 78%, FVC 82% 07/2021 ratio 72, FEV1 67%, FVC 71% 04/2021 Spirometry ratio 70, FEV1 1.60/64%, FVC 2.8/71%   11/2020  Spirometry:  (FEV1: 1.61/64%, FVC: 2.14/66%, FEV1/FVC: 75%).   06/21/19 Spirometry FVC 2.2 (70%) FEV1 1.5 (64%) Ratio 68  Interpretation: Moderate obstructive lung defect is present  Review of Systems neg for any significant sore throat, dysphagia, itching,  sneezing, nasal congestion or excess/ purulent secretions, fever, chills, sweats, unintended wt loss, pleuritic or exertional cp, hempoptysis, orthopnea pnd or change in chronic leg swelling. Also denies presyncope, palpitations, heartburn, abdominal pain, nausea, vomiting, diarrhea or change in bowel or urinary habits, dysuria,hematuria, rash, arthralgias, visual complaints, headache, numbness weakness or ataxia.     Objective:   Physical Exam   Gen. Pleasant, obese, in no distress ENT - no lesions, no post nasal drip Neck: No JVD, no thyromegaly, no carotid bruits Lungs: no use of accessory muscles, no dullness to percussion, decreased without rales or rhonchi  Cardiovascular: Rhythm regular, heart sounds  normal, no murmurs or gallops, no peripheral edema Musculoskeletal: No deformities, no cyanosis or clubbing , no tremors        Assessment & Plan:

## 2022-05-05 NOTE — Patient Instructions (Addendum)
  X Rx for Augmentin 875 bid x 7 days  Prednisone 10 mg tabs  Take 2 tabs daily with food x 5ds, then 1 tab daily with food x 5ds then STOP   X Rx  for autoCPAP 5-15 cm to [facility]  If needed, we will set up a split night study

## 2022-05-05 NOTE — Assessment & Plan Note (Signed)
Reviewed her previous sleep study in 2011. We will proceed with providing her new auto CPAP 5 to 15 cm since her weight has changed and tweak settings once download is obtained She is very compliant and CPAP is only helped improve her daytime somnolence and fatigue  Weight loss encouraged, compliance with goal of at least 4-6 hrs every night is the expectation. Advised against medications with sedative side effects Cautioned against driving when sleepy - understanding that sleepiness will vary on a day to day basis

## 2022-05-05 NOTE — Assessment & Plan Note (Signed)
Given history of bronchiectasis, we will treat her as an exacerbation.  She has completed a course of prednisone and doxycycline but still has persistent green mucus.  We will give her a course of Augmentin and low-dose prednisone for 2 weeks

## 2022-05-05 NOTE — Addendum Note (Signed)
Addended by: Fritzi Mandes D on: 05/05/2022 10:29 AM   Modules accepted: Orders

## 2022-05-11 ENCOUNTER — Telehealth: Payer: Self-pay | Admitting: Internal Medicine

## 2022-05-11 NOTE — Telephone Encounter (Signed)
   Pre-operative Risk Assessment    Patient Name: Laura [name]  DOB: Aug 20, 1961 MRN: [id number]      Request for Surgical Clearance    Procedure:   Double Osteotomy; Hammertoe Repair; Metatarsal Osteotomy; Ligament Repair  all on right foot  Date of Surgery:  Clearance 07/29/22                                 Surgeon:  Dr. Lanae Crumbly Surgeon's Group or Practice Name:  [facility] Phone number:  [phone] Fax number:  [phone]   Type of Clearance Requested:   - Medical  - Pharmacy:  Hold Apixaban (Eliquis) TBD by Cardiologist   Type of Anesthesia:   Anesthesiologist choice   Additional requests/questions:  Please fax a copy of medical clearance to the surgeon's office.  [name]   05/11/2022, 11:41 AM

## 2022-05-12 DIAGNOSIS — D839 Common variable immunodeficiency, unspecified: Secondary | ICD-10-CM | POA: Diagnosis not present

## 2022-05-12 NOTE — Telephone Encounter (Signed)
Patient with diagnosis of atrial fibrillation on Eliquis for anticoagulation.    Procedure: Double Osteotomy; Hammertoe Repair; Metatarsal Osteotomy; Ligament Repair  all on right foot   Date of procedure: 07/29/22  CHA2DS2-VASc Score = 6   This indicates a 9.7% annual risk of stroke. The patient's score is based upon: CHF History: 1 HTN History: 1 Diabetes History: 1 Stroke History: 2 (TIA) Vascular Disease History: 0 Age Score: 0 Gender Score: 1   Patient self reports history of TIA several years ago. No outside records of this are available.   CrCl 137 Platelet count 211  Per office protocol, patient can hold Eliquis for 2 days prior to procedure.   Patient will not need bridging with Lovenox (enoxaparin) around procedure.  **This guidance is not considered finalized until pre-operative APP has relayed final recommendations.**

## 2022-05-13 ENCOUNTER — Telehealth: Payer: Self-pay | Admitting: *Deleted

## 2022-05-13 DIAGNOSIS — G4733 Obstructive sleep apnea (adult) (pediatric): Secondary | ICD-10-CM | POA: Diagnosis not present

## 2022-05-13 NOTE — Telephone Encounter (Signed)
Pt agreeable to plan of care for tele appt 07/04/22 @ 9 am. Med rec and consent are done.     Patient Consent for Virtual Visit        Laura [name] has provided verbal consent on 05/13/2022 for a virtual visit (video or telephone).   CONSENT FOR VIRTUAL VISIT FOR:  Laura [name]  By participating in this virtual visit I agree to the following:  I hereby voluntarily request, consent and authorize [facility] and its employed or contracted physicians, physician assistants, nurse practitioners or other licensed health care professionals (the Practitioner), to provide me with telemedicine health care services (the "Services") as deemed necessary by the treating Practitioner. I acknowledge and consent to receive the Services by the Practitioner via telemedicine. I understand that the telemedicine visit will involve communicating with the Practitioner through live audiovisual communication technology and the disclosure of certain medical information by electronic transmission. I acknowledge that I have been given the opportunity to request an in-person assessment or other available alternative prior to the telemedicine visit and am voluntarily participating in the telemedicine visit.  I understand that I have the right to withhold or withdraw my consent to the use of telemedicine in the course of my care at any time, without affecting my right to future care or treatment, and that the Practitioner or I may terminate the telemedicine visit at any time. I understand that I have the right to inspect all information obtained and/or recorded in the course of the telemedicine visit and may receive copies of available information for a reasonable fee.  I understand that some of the potential risks of receiving the Services via telemedicine include:  Delay or interruption in medical evaluation due to technological equipment failure or disruption; Information transmitted may not be sufficient (e.g. poor  resolution of images) to allow for appropriate medical decision making by the Practitioner; and/or  In rare instances, security protocols could fail, causing a breach of personal health information.  Furthermore, I acknowledge that it is my responsibility to provide information about my medical history, conditions and care that is complete and accurate to the best of my ability. I acknowledge that Practitioner's advice, recommendations, and/or decision may be based on factors not within their control, such as incomplete or inaccurate data provided by me or distortions of diagnostic images or specimens that may result from electronic transmissions. I understand that the practice of medicine is not an exact science and that Practitioner makes no warranties or guarantees regarding treatment outcomes. I acknowledge that a copy of this consent can be made available to me via my patient portal ([place]), or I can request a printed copy by calling the office of [facility].    I understand that my insurance will be billed for this visit.   I have read or had this consent read to me. I understand the contents of this consent, which adequately explains the benefits and risks of the Services being provided via telemedicine.  I have been provided ample opportunity to ask questions regarding this consent and the Services and have had my questions answered to my satisfaction. I give my informed consent for the services to be provided through the use of telemedicine in my medical care

## 2022-05-13 NOTE — Telephone Encounter (Signed)
   Name: Laura [name]  DOB: 01-03-1961  MRN: [id number]  Primary Cardiologist: Carlyle Dolly, MD   Preoperative team, please contact this patient and set up a phone call appointment for further preoperative risk assessment. Please obtain consent and complete medication review. Thank you for your help.  I confirm that guidance regarding antiplatelet and oral anticoagulation therapy has been completed and, if necessary, noted below.  Per office protocol, patient can hold Eliquis for 2 days prior to procedure.   Patient will not need bridging with Lovenox (enoxaparin) around procedure.  Mable Fill, Marissa Nestle, NP 05/13/2022, 7:26 AM [facility] [address] [place], [place] [address]

## 2022-05-13 NOTE — Telephone Encounter (Signed)
Pt agreeable to plan of care for tele appt 07/04/22 @ 9 am. Med rec and consent are done.

## 2022-05-16 ENCOUNTER — Telehealth: Payer: Self-pay | Admitting: "Endocrinology

## 2022-05-16 DIAGNOSIS — E1165 Type 2 diabetes mellitus with hyperglycemia: Secondary | ICD-10-CM

## 2022-05-16 MED ORDER — BD PEN NEEDLE NANO 2ND GEN 32G X 4 MM MISC: 1.0000 | Freq: Four times a day (QID) | 0 refills | Status: AC

## 2022-05-16 NOTE — Telephone Encounter (Signed)
Rx sent to requested pharmacy

## 2022-05-16 NOTE — Telephone Encounter (Signed)
New message   Forgot her medication out of town until next Sunday   Over the counter would be $50.00   1. Which medications need to be refilled? (please list name of each medication and dose if known) BD PEN NEEDLE NANO 2ND GEN 32G X 4 MM MISC  2. Which pharmacy/location (including street and city if local pharmacy) is medication to be sent to?  [facility] in [place]   3. Do they need a 30 day or 90 day supply? 30 day

## 2022-05-17 ENCOUNTER — Other Ambulatory Visit: Payer: Self-pay

## 2022-05-17 ENCOUNTER — Other Ambulatory Visit: Payer: Self-pay | Admitting: Cardiology

## 2022-05-17 DIAGNOSIS — J4550 Severe persistent asthma, uncomplicated: Secondary | ICD-10-CM

## 2022-05-17 NOTE — Addendum Note (Signed)
Addended by: Fritzi Mandes D on: 05/17/2022 02:37 PM   Modules accepted: Orders

## 2022-05-17 NOTE — Progress Notes (Signed)
Corrected order for [facility] referral.

## 2022-05-17 NOTE — Telephone Encounter (Signed)
Prescription refill request for Eliquis received. Indication: Atrial Fib Last office visit: 04/05/22  Zandra Abts MD Scr: 0.7 on 04/01/22 Age: 61 Weight: 99kg  Based on above findings Eliquis '5mg'$  twice daily is the appropriate dose.  Refill approved.

## 2022-05-18 ENCOUNTER — Telehealth: Payer: Self-pay | Admitting: Pulmonary Disease

## 2022-05-18 DIAGNOSIS — G4733 Obstructive sleep apnea (adult) (pediatric): Secondary | ICD-10-CM

## 2022-05-18 NOTE — Telephone Encounter (Signed)
Dr. Elsworth Soho, please advise if you are okay with [place] ordering an in-lab study.

## 2022-05-18 NOTE — Telephone Encounter (Signed)
Order for in-lab study has been placed. Nothing further needed.

## 2022-05-20 ENCOUNTER — Telehealth: Payer: Self-pay | Admitting: *Deleted

## 2022-05-20 NOTE — Telephone Encounter (Signed)
Left voice message regarding PREP program at the [facility].

## 2022-05-23 ENCOUNTER — Telehealth: Payer: Self-pay | Admitting: *Deleted

## 2022-05-23 NOTE — Telephone Encounter (Signed)
Patient returned call regarding PREP Class. Wants to participate but states she has upcoming foot surgery in October. Is interested in class later this fall/winter. Will call her back when dates are set for future class,

## 2022-05-26 ENCOUNTER — Telehealth: Payer: Self-pay | Admitting: Neurology

## 2022-05-26 DIAGNOSIS — D839 Common variable immunodeficiency, unspecified: Secondary | ICD-10-CM | POA: Diagnosis not present

## 2022-05-26 NOTE — Telephone Encounter (Signed)
No answer at 3:08pm 05/26/2022, needs to be seen for follow up, cancelled her last appt.

## 2022-05-26 NOTE — Telephone Encounter (Signed)
1. Which medications need refilled? (List name and dosage, if known) Topamax  2. Which pharmacy/location is medication to be sent to? (include street and city if local pharmacy) [place]

## 2022-05-27 ENCOUNTER — Telehealth: Payer: Self-pay | Admitting: Allergy & Immunology

## 2022-05-27 ENCOUNTER — Ambulatory Visit: Payer: Medicare Other | Admitting: "Endocrinology

## 2022-05-27 NOTE — Telephone Encounter (Signed)
Tammy called in and states a referral was put in to neurology "a while ago" to [facility] and she still hasn't heard anything and wanted an update on this referral.

## 2022-05-27 NOTE — Telephone Encounter (Signed)
I spoke with patient, she says, "She didn't cancel her appt". Will have to reschedule to get Topamax refilled. Advised to make an appt.

## 2022-05-30 ENCOUNTER — Encounter: Payer: Self-pay | Admitting: Physician Assistant

## 2022-05-30 ENCOUNTER — Ambulatory Visit (INDEPENDENT_AMBULATORY_CARE_PROVIDER_SITE_OTHER): Payer: Medicare Other | Admitting: Physician Assistant

## 2022-05-30 ENCOUNTER — Other Ambulatory Visit: Payer: Self-pay

## 2022-05-30 ENCOUNTER — Telehealth: Payer: Self-pay | Admitting: Physician Assistant

## 2022-05-30 VITALS — BP 164/95 | Resp 18 | Ht 64.0 in

## 2022-05-30 DIAGNOSIS — R251 Tremor, unspecified: Secondary | ICD-10-CM | POA: Diagnosis not present

## 2022-05-30 MED ORDER — TOPIRAMATE 100 MG PO TABS: 100.0000 mg | ORAL_TABLET | Freq: Two times a day (BID) | ORAL | 0 refills | Status: DC

## 2022-05-30 MED ORDER — TOPIRAMATE 100 MG PO TABS
100.0000 mg | ORAL_TABLET | Freq: Two times a day (BID) | ORAL | 4 refills | Status: DC
Start: 2022-05-30 — End: 2022-05-30

## 2022-05-30 NOTE — Patient Instructions (Addendum)
Continue topiramate 100 mg  twice daily   Follow up in 1 year with Dr. Carles Collet   The physicians and staff at [facility] are committed to providing excellent care. You may receive a survey requesting feedback about your experience at our office. We strive to receive "very good" responses to the survey questions. If you feel that your experience would prevent you from giving the office a "very good " response, please contact our office to try to remedy the situation. We may be reached at [phone]. Thank you for taking the time out of your busy day to complete the survey.

## 2022-05-30 NOTE — Progress Notes (Signed)
Assessment/Plan:   1.  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 She is on both, psychiatric tremor inducing meds (Depakote) as well as asthma meds. In addition she has anxiety which may contribute to the signs and symptoms Exam essentially unchanged from prior. No other Parkinsonian signs.   Continue topiramate 100 mg bid.    2.  Hand paresthesias Prior EMG with Dr. 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 Christella Noa) .   3.  Cervical spinal stenosis             -following with Dr. Christella Noa, not a candidate for surgery as per MD    4. Follow up with her PCP on her other medical issues    Subjective:   Laura [name] was seen today in follow up for tremor.  My previous records were reviewed prior to todays visit as well as outside records available to me.  Patient is currently on topiramate 100 mg twice daily.  She needs refill, and was instructed to return to the office for follow-up prior to doing so.  Denies any side effects of the medication.  She continues to be on tremor inducing medications, both from psychiatry as well as from pulmonary.Denies falls. Denies lightheadedness or near syncope. No hallucinations. Mood is good. Last seen by Psychiatry 1 month ago. Notes reviewed. Tremors not affected by caffeine (1 coffee a day), not affected by Alcohol, stress or fatigue. Does not spill soup with a spoon, and does not spill water from glass. Does not affect her ADLS except when putting makeup or writing.     Current prescribed movement disorder medications: Topiramate 100 mg twice daily  Current medications that may exacerbate tremor: Depakote (being for many years), Helyn Numbers     Past psychiatric medications: Lithium, carbamazepine, risperidone, Geodon, olanzapine,  quetiapine, Abilify, Ingrezza.   ALLERGIES:   Allergies  Allergen Reactions   Ativan [Lorazepam] Hives   Phenergan [Promethazine Hcl] Hives    CURRENT MEDICATIONS:  Outpatient Encounter Medications as of 05/30/2022  Medication Sig   acetaminophen (TYLENOL) 325 MG tablet Take 2 tablets (650 mg total) by mouth every 6 (six) hours as needed for mild pain, fever or headache.   albuterol (PROVENTIL) (2.5 MG/3ML) 0.083% nebulizer solution Take 3 mLs (2.5 mg total) by nebulization every 4 (four) hours as needed for wheezing or shortness of breath.   albuterol (VENTOLIN HFA) 108 (90 Base) MCG/ACT inhaler INHALE 2 PUFFS INTO THE LUNGS EVERY 6 HOURS AS NEEDED FOR WHEEZING OR SHORTNESS OF BREATH   apixaban (ELIQUIS) 5 MG TABS tablet TAKE ONE TABLET BY MOUTH TWICE DAILY $RemoveBef'@9AM'VocMAvzwqg$  & 5PM   atorvastatin (LIPITOR) 10 MG tablet Take 10 mg by mouth at bedtime.   BD PEN NEEDLE NANO 2ND GEN 32G X 4 MM MISC 1 each by Other route in the morning, at noon, in the evening, and at bedtime.   Benralizumab (FASENRA PEN) 30 MG/ML SOAJ Inject 1 mL (30 mg total) into the skin every 28 (twenty-eight) days. Then every 8 weeks   bisoprolol (ZEBETA) 5 MG tablet Take 1 tablet (5 mg total) by mouth daily.   blood glucose meter kit and supplies 1 each by Other route 4 (four) times daily. Dispense based on patient and insurance preference. Use up to four times daily as directed. (  FOR ICD-10 E10.9, E11.9).   Cholecalciferol (VITAMIN D3) 125 MCG (5000 UT) CAPS Take 1 capsule (5,000 Units total) by mouth daily.   Continuous Blood Gluc Sensor (FREESTYLE LIBRE 3 SENSOR) MISC 1 Piece by Does not apply route every 14 (fourteen) days. Place 1 sensor on the skin every 14 days. Use to check glucose continuously   CUVITRU 10 GM/50ML SOLN Inject 25 mg into the skin every 14 (fourteen) days.   diltiazem (CARDIZEM CD) 240 MG 24 hr capsule Take 1 capsule (240 mg total) by mouth daily.   divalproex (DEPAKOTE ER) 500 MG 24 hr tablet Take 1 tablet (500  mg total) by mouth daily AND 2 tablets (1,000 mg total) at bedtime.   Dulaglutide (TRULICITY) 3 ZO/1.0RU SOPN Inject 3 mg as directed once a week. (Patient taking differently: Inject 1.5 mg as directed once a week.)   ELDERBERRY PO Take 4 g by mouth daily. Gummies 2g each   ferrous sulfate 324 MG TBEC Take 324 mg by mouth every evening.   fluticasone (FLOVENT HFA) 110 MCG/ACT inhaler Inhale 2 puffs into the lungs 2 (two) times daily.   fluticasone furoate-vilanterol (BREO ELLIPTA) 100-25 MCG/ACT AEPB INHALE 1 PUFF INTO LUNGS DAILY (BULK)   gabapentin (NEURONTIN) 600 MG tablet Take 600 mg by mouth 3 (three) times daily.   hydrOXYzine (ATARAX) 25 MG tablet Take 1 tablet (25 mg total) by mouth 3 (three) times daily as needed for anxiety or itching.   Immune Globulin, Human, (CUVITRU [place]) Inject 25 mg into the skin every 14 (fourteen) days.   insulin glargine (LANTUS SOLOSTAR) 100 UNIT/ML Solostar Pen Inject 80 Units into the skin at bedtime.   Insulin Pen Needle (PEN NEEDLES 3/16") 31G X 5 MM MISC Use as directed with insulin pen   levalbuterol (XOPENEX HFA) 45 MCG/ACT inhaler Inhale 2 puffs into the lungs every 6 (six) hours as needed.   levalbuterol (XOPENEX) 1.25 MG/3ML nebulizer solution Inhale 1.25 mg into the lungs daily as needed (asthma).   linaclotide (LINZESS) 145 MCG CAPS capsule Take 1 capsule (145 mcg total) by mouth daily before breakfast. Office visit needed before further refills   losartan (COZAAR) 25 MG tablet Take 1 tablet (25 mg total) by mouth daily.   MELATONIN GUMMIES PO Take 10 mg by mouth at bedtime.   metFORMIN (GLUCOPHAGE) 500 MG tablet Take 500 mg by mouth 2 (two) times daily.   metoCLOPramide (REGLAN) 5 MG tablet Take 5 mg by mouth 3 (three) times daily.   montelukast (SINGULAIR) 10 MG tablet Take 10 mg by mouth at bedtime.   Multiple Vitamins-Minerals (MULTIVITAMIN WITH MINERALS) tablet Take 1 tablet by mouth daily. Woman   Nebulizer MISC Nebulizer tubing kit    olmesartan (BENICAR) 40 MG tablet Take 40 mg by mouth daily.   omeprazole (PRILOSEC) 20 MG capsule TAKE 1 CAPSULE(20 MG) BY MOUTH TWICE DAILY BEFORE A MEAL   ondansetron (ZOFRAN-ODT) 8 MG disintegrating tablet Take 8 mg by mouth daily as needed for vomiting or nausea.   oxyCODONE-acetaminophen (PERCOCET) 5-325 MG tablet Take 1 tablet by mouth every 6 (six) hours as needed.   polyethylene glycol (MIRALAX / GLYCOLAX) 17 g packet Take 17 g by mouth daily as needed. (Patient taking differently: Take 17 g by mouth daily as needed for moderate constipation or severe constipation.)   Potassium Chloride ER 20 MEQ TBCR Take 20 mEq by mouth daily.   Spacer/Aero-Holding Chambers (AEROCHAMBER PLUS) inhaler 1 each by Other route as needed for other. Use as  instructed   torsemide (DEMADEX) 20 MG tablet Take 1 tablet daily for fluid gain of 3 lb overnight or 5 lbs in a week. (Patient taking differently: as needed. Take 1 tablet daily for fluid gain of 3 lb overnight or 5 lbs in a week.)   triamcinolone cream (KENALOG) 0.1 % Apply 1 application. topically daily as needed (foot).   vitamin B-12 (CYANOCOBALAMIN) 1000 MCG tablet Take 1,000 mcg by mouth daily.   vitamin C (ASCORBIC ACID) 500 MG tablet Take 500 mg by mouth daily. Power C immune support   amoxicillin-clavulanate (AUGMENTIN) 875-125 MG tablet Take 1 tablet by mouth 2 (two) times daily. (Patient not taking: Reported on 05/30/2022)   EPINEPHrine 0.3 mg/0.3 mL IJ SOAJ injection Inject 0.3 mg into the muscle once as needed for anaphylaxis. (Patient not taking: Reported on 05/30/2022)   levothyroxine (SYNTHROID, LEVOTHROID) 112 MCG tablet Take 112 mcg by mouth daily before breakfast.   lidocaine-prilocaine (EMLA) cream Apply 1 application. topically as needed [facility]). (Patient not taking: Reported on 05/30/2022)   ONETOUCH ULTRA test strip 1 each by Other route in the morning, at noon, in the evening, and at bedtime.   topiramate (TOPAMAX) 100 MG tablet Take 1  tablet (100 mg total) by mouth 2 (two) times daily.   [DISCONTINUED] topiramate (TOPAMAX) 100 MG tablet Take 1 tablet (100 mg total) by mouth 2 (two) times daily. (Patient not taking: Reported on 05/30/2022)   [DISCONTINUED] topiramate (TOPAMAX) 100 MG tablet Take 1 tablet (100 mg total) by mouth 2 (two) times daily.   Facility-Administered Encounter Medications as of 05/30/2022  Medication   Benralizumab SOSY 30 mg    Objective:   PHYSICAL EXAMINATION:    VITALS:   Vitals:   05/30/22 0917  BP: (!) 164/95  Resp: 18  SpO2: 98%  Height: $Remove'5\' 4"'yQZZDDb$  (1.626 m)    GEN:  The patient appears stated age and is in NAD. HEENT:  Normocephalic, atraumatic.  The mucous membranes are moist. The superficial temporal arteries are without ropiness or tenderness. CV:  RRR Lungs:  CTAB Neck/HEME:  There are no carotid bruits bilaterally.  Neurological examination:  Orientation: The patient is alert and oriented x3. Cranial nerves: There is good facial symmetry with no facial hypomimia. The speech is fluent and clear. Soft palate rises symmetrically and there is no tongue deviation. Hearing is intact to conversational tone. Sensation: Sensation is intact to light touch throughout Motor: Strength is at least antigravity x4.  Movement examination: Tone: There is normal tone in the extremities Abnormal movements:  Mild Postural tremor on the R, no tremor at rest. She is able to draw Archimedes spirals without significant difficulty,  Coordination:  There is no decremation with RAM's, good FNF. No dysdiachokinesia or dysmetria Gait and Station: The patient has no difficulty arising out of a deep-seated chair without the use of the hands. The patient's stride length is normal.  The patient has abnormal  pull test.     I have reviewed and interpreted the following labs independently  Initial evaluation 08/26/20 Dr.Tat [name] was seen in consultation in the movement disorder clinic at the request of  Pablo Lawrence, NP.  The evaluation is for tremor.  Patient previously saw Dr. Rexene Alberts for the same.  Notes from Dr. Rexene Alberts are reviewed, as well as notes from primary care and psychiatry.  Patient last saw Dr. Rexene Alberts in March, 2021.  When she initially saw Dr. 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  Abilify was discontinued and that aspect improved.  When she followed up in March, 2021 it was felt that she still had some tremor, likely due to continued drug exposure (Depakote) as well as asthma medications.  No further treatments were recommended, due to the fact that she was not a beta-blocker candidate with her history of depression and asthma, and she was also on Eliquis.  Psychiatry records indicate that they have tried to start tapering down Depakote because of tremor.    Tremor started approximately 5 years ago and involves the bilateral UE, R>L.  She is R hand dominant.  Tremor is most noticeable when carrying things.   There is no family hx of tremor.     Affected by caffeine:  No. (drinks 1 cup coffee/day) Affected by alcohol:  Doesn't drink alcohol Affected by stress:  Yes.   Affected by fatigue:  No. Spills soup if on spoon:  Yes.   Spills glass of liquid if full:  Yes.   Affects ADL's (tying shoes, brushing teeth, etc):  No. but putting on makeup is difficult   Current/Previously tried tremor medications: (Not candidate for beta-blocker because of significant asthma/depression; not candidate for primidone because of Eliquis)   Current medications that may exacerbate tremor:  Depakote (been on many years), Spiriva, Dulera previously - now Symbicort; Singulair   Past psychiatric meds:   lithium, carbamazepine, risperidone, Geodon, olanzapine, quetiapine, Abilify, Ingress      Chemistry      Component Value Date/Time   NA 142 03/31/2022 0000   K 4.4 03/31/2022 0000   CL 108 03/31/2022 0000   CO2 20 03/31/2022 0000   BUN 20 03/31/2022  0000   CREATININE 0.7 03/31/2022 0000   CREATININE 0.69 03/18/2022 1425   CREATININE 0.51 03/13/2018 1034   GLU 105 03/31/2022 0000      Component Value Date/Time   CALCIUM 9.3 03/31/2022 0000   ALKPHOS 72 03/31/2022 0000   AST 10 (A) 03/31/2022 0000   ALT 13 03/31/2022 0000   BILITOT 0.5 03/14/2022 1733   BILITOT 0.4 03/15/2019 1407       Lab Results  Component Value Date   WBC 4.3 03/18/2022   HGB 13.9 03/18/2022   HCT 40.9 03/18/2022   MCV 93.8 03/18/2022   PLT 211 03/18/2022    Lab Results  Component Value Date   TSH 1.54 03/31/2022     Total time spent on today's visit was 22 minutes, including both face-to-face time and nonface-to-face time.  Time included that spent on review of records (prior notes available to me/labs/imaging if pertinent), discussing treatment and goals, answering patient's questions and coordinating care.  Cc:  [name], MD

## 2022-05-30 NOTE — Telephone Encounter (Signed)
Patient advised and sent to [facility] on [address].

## 2022-05-30 NOTE — Telephone Encounter (Signed)
Patient is out of medication for the topamax. She thinks we called it in to the mail order service and she wanted to have it called into the [facility] in [place] so she can get it today. Please call it into the [facility] she  has been without the medication since Saturday   [facility] on [address] in [place]

## 2022-05-31 DIAGNOSIS — E1165 Type 2 diabetes mellitus with hyperglycemia: Secondary | ICD-10-CM | POA: Diagnosis not present

## 2022-06-02 ENCOUNTER — Ambulatory Visit (INDEPENDENT_AMBULATORY_CARE_PROVIDER_SITE_OTHER): Payer: Medicare Other

## 2022-06-02 ENCOUNTER — Ambulatory Visit (INDEPENDENT_AMBULATORY_CARE_PROVIDER_SITE_OTHER): Payer: Medicare Other | Admitting: Internal Medicine

## 2022-06-02 ENCOUNTER — Encounter: Payer: Self-pay | Admitting: Internal Medicine

## 2022-06-02 VITALS — BP 144/70 | HR 85 | Ht 64.0 in | Wt 224.4 lb

## 2022-06-02 DIAGNOSIS — I4891 Unspecified atrial fibrillation: Secondary | ICD-10-CM | POA: Diagnosis not present

## 2022-06-02 DIAGNOSIS — I495 Sick sinus syndrome: Secondary | ICD-10-CM | POA: Diagnosis not present

## 2022-06-02 LAB — CUP PACEART REMOTE DEVICE CHECK
Battery Remaining Longevity: 110 mo
Battery Remaining Percentage: 95.5 %
Battery Voltage: 3.02 V
Brady Statistic AP VP Percent: 0 %
Brady Statistic AP VS Percent: 0 %
Brady Statistic AS VP Percent: 32 %
Brady Statistic AS VS Percent: 45 %
Brady Statistic RA Percent Paced: 1 %
Brady Statistic RV Percent Paced: 46 %
Date Time Interrogation Session: 20230824020013
Implantable Lead Implant Date: 20230525
Implantable Lead Implant Date: 20230525
Implantable Lead Location: 753859
Implantable Lead Location: 753860
Implantable Pulse Generator Implant Date: 20230525
Lead Channel Impedance Value: 440 Ohm
Lead Channel Impedance Value: 460 Ohm
Lead Channel Pacing Threshold Amplitude: 0.625 V
Lead Channel Pacing Threshold Pulse Width: 0.5 ms
Lead Channel Sensing Intrinsic Amplitude: 11.3 mV
Lead Channel Sensing Intrinsic Amplitude: 2.2 mV
Lead Channel Setting Pacing Amplitude: 0.875
Lead Channel Setting Pacing Amplitude: 3.5 V
Lead Channel Setting Pacing Pulse Width: 0.5 ms
Lead Channel Setting Sensing Sensitivity: 0.5 mV
Pulse Gen Model: 2272
Pulse Gen Serial Number: 8084453

## 2022-06-02 LAB — CUP PACEART INCLINIC DEVICE CHECK
Date Time Interrogation Session: 20230824092505
Implantable Lead Implant Date: 20230525
Implantable Lead Implant Date: 20230525
Implantable Lead Location: 753859
Implantable Lead Location: 753860
Implantable Pulse Generator Implant Date: 20230525
Pulse Gen Model: 2272
Pulse Gen Serial Number: 8084453

## 2022-06-02 MED ORDER — BISOPROLOL FUMARATE 10 MG PO TABS: 10.0000 mg | ORAL_TABLET | Freq: Every day | ORAL | 3 refills | Status: DC

## 2022-06-02 NOTE — Patient Instructions (Signed)
Medication Instructions:   Increase Bisoprolol to 10 mg Daily   *If you need a refill on your cardiac medications before your next appointment, please call your pharmacy*   Lab Work: NONE  If you have labs (blood work) drawn today and your tests are completely normal, you will receive your results only by: [facility] (if you have MyChart) OR A paper copy in the mail If you have any lab test that is abnormal or we need to change your treatment, we will call you to review the results.   Testing/Procedures: NONE    Follow-Up: At [facility], you and your health needs are our priority.  As part of our continuing mission to provide you with exceptional heart care, we have created designated Provider Care Teams.  These Care Teams include your primary Cardiologist (physician) and Advanced Practice Providers (APPs -  Physician Assistants and Nurse Practitioners) who all work together to provide you with the care you need, when you need it.  We recommend signing up for the patient portal called "MyChart".  Sign up information is provided on this After Visit Summary.  MyChart is used to connect with patients for Virtual Visits (Telemedicine).  Patients are able to view lab/test results, encounter notes, upcoming appointments, etc.  Non-urgent messages can be sent to your provider as well.   To learn more about what you can do with MyChart, go to [email or web address].    Your next appointment:   1 year(s)  The format for your next appointment:   In Person  Provider:   Cristopher Peru, MD    Other Instructions Thank you for choosing [facility]!    Important Information About Sugar

## 2022-06-02 NOTE — Progress Notes (Signed)
HPI 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 She has been cardioverted 4 times from atrial fib. She underwent PPM insertion 3 months ago. She is better. Her HR is still a little high but no pauses.  Allergies  Allergen Reactions   Ativan [Lorazepam] Hives   Phenergan [Promethazine Hcl] Hives     Current Outpatient Medications  Medication Sig Dispense Refill   acetaminophen (TYLENOL) 325 MG tablet Take 2 tablets (650 mg total) by mouth every 6 (six) hours as needed for mild pain, fever or headache. 30 tablet 1   albuterol (PROVENTIL) (2.5 MG/3ML) 0.083% nebulizer solution Take 3 mLs (2.5 mg total) by nebulization every 4 (four) hours as needed for wheezing or shortness of breath. 75 mL 1   albuterol (VENTOLIN HFA) 108 (90 Base) MCG/ACT inhaler INHALE 2 PUFFS INTO THE LUNGS EVERY 6 HOURS AS NEEDED FOR WHEEZING OR SHORTNESS OF BREATH 18 g 1   apixaban (ELIQUIS) 5 MG TABS tablet TAKE ONE TABLET BY MOUTH TWICE DAILY $RemoveBef'@9AM'[id number]$  & 5PM 180 tablet 1   atorvastatin (LIPITOR) 10 MG tablet Take 10 mg by mouth at bedtime.     BD PEN NEEDLE NANO 2ND GEN 32G X 4 MM MISC 1 each by Other route in the morning, at noon, in the evening, and at bedtime. 90 each 0   Benralizumab (FASENRA PEN) 30 MG/ML SOAJ Inject 1 mL (30 mg total) into the skin every 28 (twenty-eight) days. Then every 8 weeks 1 mL 8   bisoprolol (ZEBETA) 5 MG tablet Take 1 tablet (5 mg total) by mouth daily. 90 tablet 3   blood glucose meter kit and supplies 1 each by Other route 4 (four) times daily. Dispense based on patient and insurance preference. Use up to four times daily as directed. (FOR ICD-10 E10.9, E11.9). 1 each 0   Cholecalciferol (VITAMIN D3) 125 MCG (5000 UT) CAPS Take 1 capsule (5,000 Units total) by mouth daily. 90 capsule 0   Continuous Blood Gluc Sensor (FREESTYLE LIBRE 3 SENSOR) MISC 1 Piece by Does not apply route every 14  (fourteen) days. Place 1 sensor on the skin every 14 days. Use to check glucose continuously 6 each 2   CUVITRU 10 GM/50ML SOLN Inject 25 mg into the skin every 14 (fourteen) days.     diltiazem (CARDIZEM CD) 240 MG 24 hr capsule Take 1 capsule (240 mg total) by mouth daily. 90 capsule 3   divalproex (DEPAKOTE ER) 500 MG 24 hr tablet Take 1 tablet (500 mg total) by mouth daily AND 2 tablets (1,000 mg total) at bedtime. 270 tablet 0   Dulaglutide (TRULICITY) 3 EX/9.3ZJ SOPN Inject 3 mg as directed once a week. (Patient taking differently: Inject 1.5 mg as directed once a week.) 6 mL 1   ELDERBERRY PO Take 4 g by mouth daily. Gummies 2g each     EPINEPHrine 0.3 mg/0.3 mL IJ SOAJ injection Inject 0.3 mg into the muscle once as needed for anaphylaxis.     ferrous sulfate 324 MG TBEC Take 324 mg by mouth every evening.     fluticasone (FLOVENT HFA) 110 MCG/ACT inhaler Inhale 2 puffs into the lungs 2 (two) times daily. 1 each 5   fluticasone furoate-vilanterol (BREO ELLIPTA) 100-25 MCG/ACT AEPB INHALE 1 PUFF INTO LUNGS DAILY (BULK) 60 each 5   gabapentin (NEURONTIN) 600 MG tablet Take 600 mg by mouth 3 (three) times daily.  hydrOXYzine (ATARAX) 25 MG tablet Take 1 tablet (25 mg total) by mouth 3 (three) times daily as needed for anxiety or itching. 30 tablet 5   Immune Globulin, Human, (CUVITRU ) Inject 25 mg into the skin every 14 (fourteen) days.     insulin glargine (LANTUS SOLOSTAR) 100 UNIT/ML Solostar Pen Inject 80 Units into the skin at bedtime. 30 mL 2   Insulin Pen Needle (PEN NEEDLES 3/16") 31G X 5 MM MISC Use as directed with insulin pen 100 each 0   levalbuterol (XOPENEX HFA) 45 MCG/ACT inhaler Inhale 2 puffs into the lungs every 6 (six) hours as needed.     levalbuterol (XOPENEX) 1.25 MG/3ML nebulizer solution Inhale 1.25 mg into the lungs daily as needed (asthma).     levothyroxine (SYNTHROID, LEVOTHROID) 112 MCG tablet Take 112 mcg by mouth daily before breakfast.      lidocaine-prilocaine (EMLA) cream Apply 1 application  topically as needed [facility]).     linaclotide (LINZESS) 145 MCG CAPS capsule Take 1 capsule (145 mcg total) by mouth daily before breakfast. Office visit needed before further refills 90 capsule 1   losartan (COZAAR) 25 MG tablet Take 1 tablet (25 mg total) by mouth daily. 90 tablet 1   MELATONIN GUMMIES PO Take 10 mg by mouth at bedtime.     metFORMIN (GLUCOPHAGE) 500 MG tablet Take 500 mg by mouth 2 (two) times daily.     metoCLOPramide (REGLAN) 5 MG tablet Take 5 mg by mouth 3 (three) times daily.     montelukast (SINGULAIR) 10 MG tablet Take 10 mg by mouth at bedtime.     Multiple Vitamins-Minerals (MULTIVITAMIN WITH MINERALS) tablet Take 1 tablet by mouth daily. Woman     Nebulizer MISC Nebulizer tubing kit 2 each 5   olmesartan (BENICAR) 40 MG tablet Take 40 mg by mouth daily.     omeprazole (PRILOSEC) 20 MG capsule TAKE 1 CAPSULE(20 MG) BY MOUTH TWICE DAILY BEFORE A MEAL 30 capsule 5   ondansetron (ZOFRAN-ODT) 8 MG disintegrating tablet Take 8 mg by mouth daily as needed for vomiting or nausea.     ONETOUCH ULTRA test strip 1 each by Other route in the morning, at noon, in the evening, and at bedtime.     oxyCODONE-acetaminophen (PERCOCET) 5-325 MG tablet Take 1 tablet by mouth every 6 (six) hours as needed. 10 tablet 0   polyethylene glycol (MIRALAX / GLYCOLAX) 17 g packet Take 17 g by mouth daily as needed. (Patient taking differently: Take 17 g by mouth daily as needed for moderate constipation or severe constipation.) 14 each 0   Potassium Chloride ER 20 MEQ TBCR Take 20 mEq by mouth daily.     Spacer/Aero-Holding Chambers (AEROCHAMBER PLUS) inhaler 1 each by Other route as needed for other. Use as instructed 1 each 0   topiramate (TOPAMAX) 100 MG tablet Take 1 tablet (100 mg total) by mouth 2 (two) times daily. 180 tablet 0   torsemide (DEMADEX) 20 MG tablet Take 1 tablet daily for fluid gain of 3 lb overnight or 5 lbs in a week.  (Patient taking differently: as needed. Take 1 tablet daily for fluid gain of 3 lb overnight or 5 lbs in a week.) 90 tablet 3   triamcinolone cream (KENALOG) 0.1 % Apply 1 application. topically daily as needed (foot).     vitamin B-12 (CYANOCOBALAMIN) 1000 MCG tablet Take 1,000 mcg by mouth daily.     vitamin C (ASCORBIC ACID) 500 MG tablet Take 500 mg  by mouth daily. Power C immune support     Current Facility-Administered Medications  Medication Dose Route Frequency Provider Last Rate Last Admin   Benralizumab SOSY 30 mg  30 mg Subcutaneous Q28 days Dara Hoyer, FNP   30 mg at 12/27/21 1603     Past Medical History:  Diagnosis Date   Asthma    Atrial fibrillation (HCC)    Bipolar disorder (HCC)    Bronchiectasis (HCC)    Carpal tunnel syndrome of right wrist    CHF (congestive heart failure) (HCC)    Complication of anesthesia    "my Dr in [place] told me I had an allergic reaction to the combination of the pain meds and the anesthesia" she does not remember what happened but "I eneded up in the ICU for 5 days".   H/O congenital atrial septal defect (ASD) repair    Hypogammaglobulinemia ([place])    Hypothyroid    IgE deficiency (HCC)    Neuropathy    Neuropathy    Osteoarthritis    Pinched nerve in neck    PTSD (post-traumatic stress disorder)    Recurrent sinusitis    Short-term memory loss    Sleep apnea    TIA (transient ischemic attack)    Tremors of nervous system    seeing PCP for this.   Type 2 diabetes mellitus (HCC)    Urticaria    Wound infection     ROS:   All systems reviewed and negative except as noted in the HPI.   Past Surgical History:  Procedure Laterality Date   ASD REPAIR  1968   BIOPSY  01/26/2021   Procedure: BIOPSY;  Surgeon: [name], DO;  [facility]: [facility];  Service: Endoscopy;;  gastric   CARDIAC SURGERY     CARDIOVERSION     CARDIOVERSION N/A 08/29/2018   Procedure: CARDIOVERSION;  Surgeon: Arnoldo Lenis, MD;   Location: [facility];  Service: Endoscopy;  Laterality: N/A;   [place]   COLONOSCOPY WITH PROPOFOL N/A 01/26/2021   Procedure: COLONOSCOPY WITH PROPOFOL;  Surgeon: [name], DO;  [facility]: [facility];  Service: Endoscopy;  Laterality: N/A;  am appt, diabetic   ESOPHAGOGASTRODUODENOSCOPY (EGD) WITH PROPOFOL N/A 01/26/2021   Procedure: ESOPHAGOGASTRODUODENOSCOPY (EGD) WITH PROPOFOL;  Surgeon: [name], DO;  [facility];  Service: Endoscopy;  Laterality: N/A;   LEFT HEART CATH AND CORONARY ANGIOGRAPHY N/A 08/30/2019   Procedure: LEFT HEART CATH AND CORONARY ANGIOGRAPHY;  Surgeon: [name], MD;  Location: [place] [facility];  Service: Cardiovascular;  Laterality: N/A;   NASAL SINUS SURGERY  2017   PACEMAKER IMPLANT N/A 03/03/2022   Procedure: PACEMAKER IMPLANT;  Surgeon: [name], MD;  Location: [place] [facility];  Service: Cardiovascular;  Laterality: N/A;   POLYPECTOMY  01/26/2021   Procedure: POLYPECTOMY;  Surgeon: [name], DO;  Location: [facility];  Service: Endoscopy;;  colon     Family History  Problem Relation Age of Onset   Hypertension Mother    Stroke Mother    Kidney disease Mother    Heart attack Mother    Alcohol abuse Mother    Other Father        ?colon cancer   Kidney disease Sister    Hypertension Sister    Bipolar disorder Sister    Atrial fibrillation Sister    Heart disease Brother    Prostate cancer Brother  Thyroid disease Daughter    Hashimoto's thyroiditis Daughter    Celiac disease Daughter    Allergic rhinitis Daughter    Stroke Maternal Aunt    Heart disease Maternal Aunt    Asthma Neg Hx      Social History   Socioeconomic History   Marital status: Widowed    Spouse name: Not on file   Number of children: 3   Years of education: 10   Highest education level: Not on file  Occupational History   Not on file  Tobacco Use   Smoking status: Former     Packs/day: 1.00    Years: 28.00    Total pack years: 28.00    Types: Cigarettes    Start date: 17    Quit date: 2008    Years since quitting: 15.6   Smokeless tobacco: Never  Vaping Use   Vaping Use: Never used  Substance and Sexual Activity   Alcohol use: Not Currently   Drug use: Not Currently   Sexual activity: Not Currently    Birth control/protection: Post-menopausal  Other Topics Concern   Not on file  Social History Narrative   Right handed   Drinks caffeine   One story home   Social Determinants of Health   Financial Resource Strain: High Risk (01/06/2022)   Overall Financial Resource Strain (CARDIA)    Difficulty of Paying Living Expenses: Hard  Food Insecurity: No Food Insecurity (01/06/2022)   Hunger Vital Sign    Worried About Running Out of Food in the Last Year: Never true    Ran Out of Food in the Last Year: Never true  Transportation Needs: No Transportation Needs (01/06/2022)   PRAPARE - Hydrologist (Medical): No    Lack of Transportation (Non-Medical): No  Physical Activity: Insufficiently Active (01/06/2022)   Exercise Vital Sign    Days of Exercise per Week: 4 days    Minutes of Exercise per Session: 30 min  Stress: No Stress Concern Present (01/06/2022)   [place]    Feeling of Stress : Only a little  Social Connections: Moderately Integrated (01/06/2022)   Social Connection and Isolation Panel [NHANES]    Frequency of Communication with Friends and Family: More than three times a week    Frequency of Social Gatherings with Friends and Family: More than three times a week    Attends Religious Services: More than 4 times per year    Active Member of [employer] or Organizations: Yes    Attends Archivist Meetings: More than 4 times per year    Marital Status: Widowed  Intimate Partner Violence: Not At Risk (01/06/2022)   Humiliation, Afraid, Rape, and  Kick questionnaire    Fear of Current or Ex-Partner: No    Emotionally Abused: No    Physically Abused: No    Sexually Abused: No     BP (!) 144/70   Pulse 85   Ht $R'5\' 4"'DN$  (1.626 m)   Wt 224 lb 6.4 oz (101.8 kg)   SpO2 96%   BMI 38.52 kg/m   Physical Exam:  obese appearing NAD HEENT: Unremarkable Neck:  No JVD, no thyromegally Lymphatics:  No adenopathy Back:  No CVA tenderness Lungs:  scattered rales and rhochi; well healed PM incsion. HEART:  IRegular rate rhythm, no murmurs, no rubs, no clicks Abd:  soft, positive bowel sounds, no organomegally, no rebound, no guarding Ext:  2 plus pulses,  no edema, no cyanosis, no clubbing Skin:  No rashes no nodules Neuro:  CN II through XII intact, motor grossly intact  DEVICE  Normal device function.  See PaceArt for details.   Assess/Plan:   Symptomatic tachy-brady - she has improved but her HR's are still increased. I have asked her to increase the bisoprolol from 5 to 10 mg daily.  Syncope - she has had none since her PPM. Follow. Autonomic dysfunction - I suspect that this is related to her 2 Covid infections. Hopefully it will improve with a tincture of time.  Obesity - she is encouraged to lose weight.    Carleene Overlie Kaneisha Ellenberger,MD

## 2022-06-03 ENCOUNTER — Encounter: Payer: Self-pay | Admitting: Allergy & Immunology

## 2022-06-03 ENCOUNTER — Ambulatory Visit (INDEPENDENT_AMBULATORY_CARE_PROVIDER_SITE_OTHER): Payer: Medicare Other | Admitting: Allergy & Immunology

## 2022-06-03 VITALS — BP 144/70 | HR 85 | Ht 64.0 in | Wt 221.0 lb

## 2022-06-03 DIAGNOSIS — J310 Chronic rhinitis: Secondary | ICD-10-CM

## 2022-06-03 DIAGNOSIS — D839 Common variable immunodeficiency, unspecified: Secondary | ICD-10-CM | POA: Diagnosis not present

## 2022-06-03 DIAGNOSIS — R918 Other nonspecific abnormal finding of lung field: Secondary | ICD-10-CM | POA: Diagnosis not present

## 2022-06-03 DIAGNOSIS — J4550 Severe persistent asthma, uncomplicated: Secondary | ICD-10-CM | POA: Diagnosis not present

## 2022-06-03 DIAGNOSIS — G629 Polyneuropathy, unspecified: Secondary | ICD-10-CM

## 2022-06-03 MED ORDER — LEVOFLOXACIN 500 MG PO TABS: 500.0000 mg | ORAL_TABLET | Freq: Every day | ORAL | 0 refills | Status: AC

## 2022-06-03 NOTE — Progress Notes (Signed)
RE: Laura [name] MRN: [id number] DOB: [birth year] Date of Telemedicine Visit: 06/03/2022  Referring provider: Benita Stabile, MD Primary care provider: Benita Stabile, MD  Chief Complaint: referral (Wants a referral. ) and Medication Management and Neuropathy   Telemedicine Follow Up Visit via Telephone: I connected with Laura [name] for a follow up on 06/03/22 by telephone and verified that I am speaking with the correct person using two identifiers.   I discussed the limitations, risks, security and privacy concerns of performing an evaluation and management service by telephone and the availability of in person appointments. I also discussed with the patient that there may be a patient responsible charge related to this service. The patient expressed understanding and agreed to proceed.  Patient is at home.  Provider is at the office.  Visit start time: 8:20 AM Visit end time: 9:00 AM Insurance consent/check in by: Laura [name] Medical consent and medical assistant/nurse: Laura [name]  History of Present Illness:  She is a 61 y.o. female, who is being followed for a multitude of issues including common variable immunodeficiency as well as chronic rhinitis, severe persistent asthma, and multiple other issues. Her previous allergy office visit was in June 2023 with myself.  At that time, we continued with her Cuvitru infusions at 25 g every 2 weeks.  Her IgG level was excellent earlier in 2023.  Her chronic rhinitis was controlled with nasal saline rinses.  Her lung function was not done at that visit, but we continue with Breo 100 mcg 1 puff daily as well as Fasenra every 8 weeks, which she administers at home.  In the interim, she has mostly done well. She has been having URI issues for the last 3 weeks. She did see Dr. Margo [name] and was placed on prednisone. She was also placed on amoxicillin and doxycycline, but she is unsure of the dates of this. Dr. Vassie [name] also placed her on Augmentin at the end of July  2023.   Neuropathy started around 8 years ago. She has this in her fingers and hands as well as a feet and toes. It went up to her elbows as well. She was most recently placed on a medication although she cannot remember the name. She is currently on gabapentin. She recently ran out of her Topamax. She could not get a refill until she went and saw Laura [name] in person. She had to go all weekend without her Topamax.   She saw someone in [place] from management of her neuropathy.  I reviewed her [name] visits that are in the Laura [name] system.  She initially saw Laura [name] [name], first visit in September 2019, for her tremors.  She continued to see Laura [name] through March 2021.  Then she started seeing Laura [name] in November 2021 for management of her tremors.  She has never seen anyone in the Laura [name] system for management of her headaches or her neuropathy from what I can gather.  In the tween that, she transferred her care to Laura [name].  She wanted to try to find someone local to [place] to manage all of her neurological issues, which is why we referred her to Laura [name].  She was placed on some kind of medication by Laura [name]. She is unsure of the name, but it made her tremors much worse.  She was frustrated from the experience because she said Laura [name] did not answer a lot of her questions and did not explain why she was being put on this medicine.  She took this medication for two weeks and her tremors improved.  The tremors have improved as of now, they are now back to baseline.  She prefers to go back to Laura [name].  She likes Dr. Carles [name] but she was told that she needed another referral to Laura [name] [name].  She even saw a nurse practitioner 4 days ago for a medication refill. But this was for her headache medicine.   From what I can tell it seems that with a new diagnosis, she needs a new referral even though she is already established with the practice.  So we have never  referred her for neuropathy which is why we needed to schedule this televisit.  The cough has been persistent. She is coughing up yellow phlegm. She does not feel bad. She denies sinus pain and pressure. She has an intermittent headache. She does not have COVID at all; she has tested multiple times.   Her sister and her husband split up. The husband is living somewhere else now. Thankfully her sister owns the house and property and Laura [name] is living there now.   She remains on the infusions. These are going fine. She does them every two weeks. It takes around around 2-3 hours total for her infusions.  Her last IgG level in January was therapeutic around 1200.  However, over the course of 2023, she is required at least 5 or 6 courses of antibiotics, mostly for sinopulmonary infections.  She has a sleep study coming up. She recently got a new CPAP.  She is going to have a sleep study in the facility.  Evidently they wanted to do an in the home, but she preferred to go into a facility to get this done.  She is also scheduled for her foot surgery on October 20.  She is already set up for physical therapy after that through the [name].  She is going to [place] and returning September 16th.  She is excited to see her daughter out in [place].  She has already arranged for her immunoglobulin to be shipped to [place].  She seems to be best friends with Laura [name], who works with [employer].  Otherwise, there have been no changes to her past medical history, surgical history, family history, or social history.  Assessment and Plan:  Laura [name] is a 61 y.o. female with:  Common variable immunodeficiency - on immunoglobulin replacement, but many breakthrough infections in the last 12 months   Persistent sinusitis/URI - starting Levaquin today (consider sinus CT in the future)  Neuropathy - previously followed by Dr. Merlene Laughter, but wants to consolidate care at 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 Carles [name].  She likes Dr. Carles [name] and wants to continue seeing her.  However, it seems that she needs a separate referral since it is a new problem.  I am going to also get some IgG levels.  She has had a a lot of breakthrough infections this past year which is concerning.  We might need to change immunoglobulin products or increase  her dose.  Diagnostics: None.  Medication List:  Current Outpatient Medications  Medication Sig Dispense Refill   acetaminophen (TYLENOL) 325 MG tablet Take 2 tablets (650 mg total) by mouth every 6 (six) hours as needed for mild pain, fever or headache. 30 tablet 1   albuterol (PROVENTIL) (2.5 MG/3ML) 0.083% nebulizer solution Take 3 mLs (2.5 mg total) by nebulization every 4 (four) hours as needed for wheezing or shortness of breath. 75 mL 1   albuterol (VENTOLIN HFA) 108 (90 Base) MCG/ACT inhaler INHALE 2 PUFFS INTO THE LUNGS EVERY 6 HOURS AS NEEDED FOR WHEEZING OR SHORTNESS OF BREATH 18 g 1   apixaban (ELIQUIS) 5 MG TABS tablet TAKE ONE TABLET BY MOUTH TWICE DAILY $RemoveBef'@9AM'VDANSoHMCb$  & 5PM 180 tablet 1   atorvastatin (LIPITOR) 10 MG tablet Take 10 mg by mouth at bedtime.     BD PEN NEEDLE NANO 2ND GEN 32G X 4 MM MISC 1 each by Other route in the morning, at noon, in the evening, and at bedtime. 90 each 0   Benralizumab (FASENRA PEN) 30 MG/ML SOAJ Inject 1 mL (30 mg total) into the skin every 28 (twenty-eight) days. Then every 8 weeks 1 mL 8   bisoprolol (ZEBETA) 10 MG tablet Take 1 tablet (10 mg total) by mouth daily. 90 tablet 3   blood glucose meter kit and  supplies 1 each by Other route 4 (four) times daily. Dispense based on patient and insurance preference. Use up to four times daily as directed. (FOR ICD-10 E10.9, E11.9). 1 each 0   Cholecalciferol (VITAMIN D3) 125 MCG (5000 UT) CAPS Take 1 capsule (5,000 Units total) by mouth daily. 90 capsule 0   Continuous Blood Gluc Sensor (FREESTYLE LIBRE 3 SENSOR) MISC 1 Piece by Does not apply route every 14 (fourteen) days. Place 1 sensor on the skin every 14 days. Use to check glucose continuously 6 each 2   CUVITRU 10 GM/50ML SOLN Inject 25 mg into the skin every 14 (fourteen) days.     diltiazem (CARDIZEM CD) 240 MG 24 hr capsule Take 1 capsule (240 mg total) by mouth daily. 90 capsule 3   divalproex (DEPAKOTE ER) 500 MG 24 hr tablet Take 1 tablet (500 mg total) by mouth daily AND 2 tablets (1,000 mg total) at bedtime. 270 tablet 0   Dulaglutide (TRULICITY) 3 VF/6.4PP SOPN Inject 3 mg as directed once a week. (Patient taking differently: Inject 1.5 mg as directed once a week.) 6 mL 1   ELDERBERRY PO Take 4 g by mouth daily. Gummies 2g each     EPINEPHrine 0.3 mg/0.3 mL IJ SOAJ injection Inject 0.3 mg into the muscle once as needed for anaphylaxis.     ferrous sulfate 324 MG TBEC Take 324 mg by mouth every evening.     fluticasone (FLOVENT HFA) 110 MCG/ACT inhaler Inhale 2 puffs into the lungs 2 (two) times daily. 1 each 5   fluticasone furoate-vilanterol (BREO ELLIPTA) 100-25 MCG/ACT AEPB INHALE 1 PUFF INTO LUNGS DAILY (BULK) 60 each 5   gabapentin (NEURONTIN) 600 MG tablet Take 600 mg by mouth 3 (three) times daily.     hydrOXYzine (ATARAX) 25 MG tablet Take 1 tablet (25 mg total) by mouth 3 (three) times daily as needed for anxiety or itching. 30 tablet 5   Immune Globulin, Human, (CUVITRU [place]) Inject 25 mg into the skin every 14 (fourteen) days.     insulin glargine (LANTUS SOLOSTAR) 100 UNIT/ML Solostar Pen Inject 80 Units into the skin at  bedtime. 30 mL 2   Insulin Pen Needle (PEN NEEDLES 3/16") 31G X  5 MM MISC Use as directed with insulin pen 100 each 0   levalbuterol (XOPENEX HFA) 45 MCG/ACT inhaler Inhale 2 puffs into the lungs every 6 (six) hours as needed.     levalbuterol (XOPENEX) 1.25 MG/3ML nebulizer solution Inhale 1.25 mg into the lungs daily as needed (asthma).     levofloxacin (LEVAQUIN) 500 MG tablet Take 1 tablet (500 mg total) by mouth daily for 10 days. 10 tablet 0   levothyroxine (SYNTHROID, LEVOTHROID) 112 MCG tablet Take 112 mcg by mouth daily before breakfast.     lidocaine-prilocaine (EMLA) cream Apply 1 application  topically as needed [facility]).     linaclotide (LINZESS) 145 MCG CAPS capsule Take 1 capsule (145 mcg total) by mouth daily before breakfast. Office visit needed before further refills 90 capsule 1   losartan (COZAAR) 25 MG tablet Take 1 tablet (25 mg total) by mouth daily. 90 tablet 1   MELATONIN GUMMIES PO Take 10 mg by mouth at bedtime.     metFORMIN (GLUCOPHAGE) 500 MG tablet Take 500 mg by mouth 2 (two) times daily.     metoCLOPramide (REGLAN) 5 MG tablet Take 5 mg by mouth 3 (three) times daily.     montelukast (SINGULAIR) 10 MG tablet Take 10 mg by mouth at bedtime.     Multiple Vitamins-Minerals (MULTIVITAMIN WITH MINERALS) tablet Take 1 tablet by mouth daily. Woman     Nebulizer MISC Nebulizer tubing kit 2 each 5   olmesartan (BENICAR) 40 MG tablet Take 40 mg by mouth daily.     omeprazole (PRILOSEC) 20 MG capsule TAKE 1 CAPSULE(20 MG) BY MOUTH TWICE DAILY BEFORE A MEAL 30 capsule 5   ondansetron (ZOFRAN-ODT) 8 MG disintegrating tablet Take 8 mg by mouth daily as needed for vomiting or nausea.     ONETOUCH ULTRA test strip 1 each by Other route in the morning, at noon, in the evening, and at bedtime.     oxyCODONE-acetaminophen (PERCOCET) 5-325 MG tablet Take 1 tablet by mouth every 6 (six) hours as needed. 10 tablet 0   polyethylene glycol (MIRALAX / GLYCOLAX) 17 g packet Take 17 g by mouth daily as needed. (Patient taking differently: Take 17 g by  mouth daily as needed for moderate constipation or severe constipation.) 14 each 0   Potassium Chloride ER 20 MEQ TBCR Take 20 mEq by mouth daily.     Spacer/Aero-Holding Chambers (AEROCHAMBER PLUS) inhaler 1 each by Other route as needed for other. Use as instructed 1 each 0   topiramate (TOPAMAX) 100 MG tablet Take 1 tablet (100 mg total) by mouth 2 (two) times daily. 180 tablet 0   torsemide (DEMADEX) 20 MG tablet Take 1 tablet daily for fluid gain of 3 lb overnight or 5 lbs in a week. (Patient taking differently: as needed. Take 1 tablet daily for fluid gain of 3 lb overnight or 5 lbs in a week.) 90 tablet 3   triamcinolone cream (KENALOG) 0.1 % Apply 1 application. topically daily as needed (foot).     vitamin B-12 (CYANOCOBALAMIN) 1000 MCG tablet Take 1,000 mcg by mouth daily.     vitamin C (ASCORBIC ACID) 500 MG tablet Take 500 mg by mouth daily. Power C immune support     Current Facility-Administered Medications  Medication Dose Route Frequency Provider Last Rate Last Admin   Benralizumab SOSY 30 mg  30 mg Subcutaneous Q28 days Ambs, Kathrine Cords, FNP  30 mg at 12/27/21 1603   Allergies: Allergies  Allergen Reactions   Ativan [Lorazepam] Hives   Phenergan [Promethazine Hcl] Hives   I reviewed her past medical history, social history, family history, and environmental history and no significant changes have been reported from previous visits.  Review of Systems  Constitutional:  Positive for activity change. Negative for appetite change.  HENT:  Positive for congestion, sinus pressure and sore throat. Negative for postnasal drip and rhinorrhea.   Eyes:  Negative for pain, discharge, redness and itching.  Respiratory:  Positive for cough and shortness of breath. Negative for wheezing and stridor.   Gastrointestinal:  Negative for diarrhea, nausea and vomiting.  Endocrine: Negative for cold intolerance and heat intolerance.  Musculoskeletal:  Negative for arthralgias, joint swelling and  myalgias.  Skin:  Negative for rash.  Allergic/Immunologic: Positive for environmental allergies. Negative for food allergies.  Neurological:  Positive for dizziness.       Positive for neuropathy.     Objective:  Physical exam not obtained as encounter was done via telephone.   Previous notes and tests were reviewed.  I discussed the assessment and treatment plan with the patient. The patient was provided an opportunity to ask questions and all were answered. The patient agreed with the plan and demonstrated an understanding of the instructions.   The patient was advised to call back or seek an in-person evaluation if the symptoms worsen or if the condition fails to improve as anticipated.  I provided 40 minutes of non-face-to-face time during this encounter.  It was my pleasure to participate in [place] Rossmann's care today. Please feel free to contact me with any questions or concerns.   Sincerely,  Valentina Shaggy, MD

## 2022-06-08 ENCOUNTER — Encounter: Payer: Self-pay | Admitting: "Endocrinology

## 2022-06-08 ENCOUNTER — Encounter: Payer: Self-pay | Admitting: Nutrition

## 2022-06-08 ENCOUNTER — Ambulatory Visit (INDEPENDENT_AMBULATORY_CARE_PROVIDER_SITE_OTHER): Payer: Medicare Other | Admitting: "Endocrinology

## 2022-06-08 ENCOUNTER — Encounter: Payer: Medicare Other | Attending: Internal Medicine | Admitting: Nutrition

## 2022-06-08 VITALS — Ht 64.0 in | Wt 220.0 lb

## 2022-06-08 VITALS — BP 110/58 | HR 68 | Ht 64.0 in | Wt 220.4 lb

## 2022-06-08 DIAGNOSIS — Z6835 Body mass index (BMI) 35.0-35.9, adult: Secondary | ICD-10-CM

## 2022-06-08 DIAGNOSIS — E039 Hypothyroidism, unspecified: Secondary | ICD-10-CM | POA: Diagnosis not present

## 2022-06-08 DIAGNOSIS — F5002 Anorexia nervosa, binge eating/purging type: Secondary | ICD-10-CM | POA: Insufficient documentation

## 2022-06-08 DIAGNOSIS — E1165 Type 2 diabetes mellitus with hyperglycemia: Secondary | ICD-10-CM | POA: Insufficient documentation

## 2022-06-08 DIAGNOSIS — E669 Obesity, unspecified: Secondary | ICD-10-CM | POA: Diagnosis present

## 2022-06-08 DIAGNOSIS — R630 Anorexia: Secondary | ICD-10-CM | POA: Insufficient documentation

## 2022-06-08 DIAGNOSIS — E782 Mixed hyperlipidemia: Secondary | ICD-10-CM | POA: Diagnosis not present

## 2022-06-08 DIAGNOSIS — I10 Essential (primary) hypertension: Secondary | ICD-10-CM | POA: Diagnosis not present

## 2022-06-08 DIAGNOSIS — E559 Vitamin D deficiency, unspecified: Secondary | ICD-10-CM | POA: Diagnosis not present

## 2022-06-08 DIAGNOSIS — D839 Common variable immunodeficiency, unspecified: Secondary | ICD-10-CM | POA: Diagnosis not present

## 2022-06-08 LAB — POCT GLYCOSYLATED HEMOGLOBIN (HGB A1C): HbA1c, POC (controlled diabetic range): 8.3 % — AB (ref 0.0–7.0)

## 2022-06-08 MED ORDER — TRULICITY 3 MG/0.5ML ~~LOC~~ SOAJ
3.0000 mg | SUBCUTANEOUS | 1 refills | Status: DC
Start: 2022-06-08 — End: 2022-06-24

## 2022-06-08 MED ORDER — LANTUS SOLOSTAR 100 UNIT/ML ~~LOC~~ SOPN: 50.0000 [IU] | PEN_INJECTOR | Freq: Every day | SUBCUTANEOUS | 2 refills | Status: DC

## 2022-06-08 NOTE — Progress Notes (Signed)
06/08/2022, 12:30 PM   Endocrinology follow-up note   Subjective:    Patient ID: Laura [name], female    DOB: Mar 09, 1961.  Laura [name] was seen since 2019 for management of diabetes, also seen during her last visit in relation to her steroid exposure to rule out adrenal insufficiency.  PMD: [name], MD.   Past Medical History:  Diagnosis Date   Asthma    Atrial fibrillation ([place])    Bipolar disorder ([place])    Bronchiectasis ([place])    Carpal tunnel syndrome of right wrist    CHF (congestive heart failure) ([place])    Complication of anesthesia    "my Dr in [place] told me I had an allergic reaction to the combination of the pain meds and the anesthesia" she does not remember what happened but "I eneded up in the ICU for 5 days".   H/O congenital atrial septal defect (ASD) repair    Hypogammaglobulinemia ([place])    Hypothyroid    IgE deficiency (HCC)    Neuropathy    Neuropathy    Osteoarthritis    Pinched nerve in neck    PTSD (post-traumatic stress disorder)    Recurrent sinusitis    Short-term memory loss    Sleep apnea    TIA (transient ischemic attack)    Tremors of nervous system    seeing PCP for this.   Type 2 diabetes mellitus (HCC)    Urticaria    Wound infection    Past Surgical History:  Procedure Laterality Date   ASD REPAIR  1968   BIOPSY  01/26/2021   Procedure: BIOPSY;  Surgeon: [name], DO;  [facility];  Service: Endoscopy;;  gastric   CARDIAC SURGERY     CARDIOVERSION     CARDIOVERSION N/A 08/29/2018   Procedure: CARDIOVERSION;  Surgeon: Arnoldo Lenis, MD;  [facility];  Service: Endoscopy;  Laterality: N/A;   [place]   COLONOSCOPY WITH PROPOFOL N/A 01/26/2021   Procedure: COLONOSCOPY WITH PROPOFOL;  Surgeon: [name], DO;  [facility];  Service: Endoscopy;  Laterality: N/A;  am  appt, diabetic   ESOPHAGOGASTRODUODENOSCOPY (EGD) WITH PROPOFOL N/A 01/26/2021   Procedure: ESOPHAGOGASTRODUODENOSCOPY (EGD) WITH PROPOFOL;  Surgeon: [name], DO;  [facility];  Service: Endoscopy;  Laterality: N/A;   LEFT HEART CATH AND CORONARY ANGIOGRAPHY N/A 08/30/2019   Procedure: LEFT HEART CATH AND CORONARY ANGIOGRAPHY;  Surgeon: [name], MD;  Location: [facility];  Service: Cardiovascular;  Laterality: N/A;   NASAL SINUS SURGERY  2017   PACEMAKER IMPLANT N/A 03/03/2022   Procedure: PACEMAKER IMPLANT;  Surgeon: [name], MD;  Location: [facility];  Service: Cardiovascular;  Laterality: N/A;   POLYPECTOMY  01/26/2021   Procedure: POLYPECTOMY;  Surgeon: [name], DO;  [facility];  Service: Endoscopy;;  colon   Social History   Socioeconomic History   Marital status: Widowed    Spouse name: Not on file   Number of children: 3   Years of education: 10   Highest education level:  Not on file  Occupational History   Not on file  Tobacco Use   Smoking status: Former    Packs/day: 1.00    Years: 28.00    Total pack years: 28.00    Types: Cigarettes    Start date: 9    Quit date: 2008    Years since quitting: 15.6   Smokeless tobacco: Never  Vaping Use   Vaping Use: Never used  Substance and Sexual Activity   Alcohol use: Not Currently   Drug use: Not Currently   Sexual activity: Not Currently    Birth control/protection: Post-menopausal  Other Topics Concern   Not on file  Social History Narrative   Right handed   Drinks caffeine   One story home   Social Determinants of Health   Financial Resource Strain: High Risk (01/06/2022)   Overall Financial Resource Strain (CARDIA)    Difficulty of Paying Living Expenses: Hard  Food Insecurity: No Food Insecurity (01/06/2022)   Hunger Vital Sign    Worried About Running Out of Food in the Last Year: Never true    Ran Out of Food in the Last Year:  Never true  Transportation Needs: No Transportation Needs (01/06/2022)   PRAPARE - Hydrologist (Medical): No    Lack of Transportation (Non-Medical): No  Physical Activity: Insufficiently Active (01/06/2022)   Exercise Vital Sign    Days of Exercise per Week: 4 days    Minutes of Exercise per Session: 30 min  Stress: No Stress Concern Present (01/06/2022)   [place]    Feeling of Stress : Only a little  Social Connections: Moderately Integrated (01/06/2022)   Social Connection and Isolation Panel [NHANES]    Frequency of Communication with Friends and Family: More than three times a week    Frequency of Social Gatherings with Friends and Family: More than three times a week    Attends Religious Services: More than 4 times per year    Active Member of [employer] or Organizations: Yes    Attends Archivist Meetings: More than 4 times per year    Marital Status: Widowed   Outpatient Encounter Medications as of 06/08/2022  Medication Sig   acetaminophen (TYLENOL) 325 MG tablet Take 2 tablets (650 mg total) by mouth every 6 (six) hours as needed for mild pain, fever or headache.   albuterol (PROVENTIL) (2.5 MG/3ML) 0.083% nebulizer solution Take 3 mLs (2.5 mg total) by nebulization every 4 (four) hours as needed for wheezing or shortness of breath.   albuterol (VENTOLIN HFA) 108 (90 Base) MCG/ACT inhaler INHALE 2 PUFFS INTO THE LUNGS EVERY 6 HOURS AS NEEDED FOR WHEEZING OR SHORTNESS OF BREATH   apixaban (ELIQUIS) 5 MG TABS tablet TAKE ONE TABLET BY MOUTH TWICE DAILY $RemoveBef'@9AM'TDBIzoaKBv$  & 5PM   atorvastatin (LIPITOR) 10 MG tablet Take 10 mg by mouth at bedtime.   BD PEN NEEDLE NANO 2ND GEN 32G X 4 MM MISC 1 each by Other route in the morning, at noon, in the evening, and at bedtime.   Benralizumab (FASENRA PEN) 30 MG/ML SOAJ Inject 1 mL (30 mg total) into the skin every 28 (twenty-eight) days. Then every 8 weeks    bisoprolol (ZEBETA) 10 MG tablet Take 1 tablet (10 mg total) by mouth daily.   blood glucose meter kit and supplies 1 each by Other route 4 (four) times daily. Dispense based on patient and insurance preference. Use up to  four times daily as directed. (FOR ICD-10 E10.9, E11.9).   Cholecalciferol (VITAMIN D3) 125 MCG (5000 UT) CAPS Take 1 capsule (5,000 Units total) by mouth daily.   Continuous Blood Gluc Sensor (FREESTYLE LIBRE 3 SENSOR) MISC 1 Piece by Does not apply route every 14 (fourteen) days. Place 1 sensor on the skin every 14 days. Use to check glucose continuously   CUVITRU 10 GM/50ML SOLN Inject 25 mg into the skin every 14 (fourteen) days.   diltiazem (CARDIZEM CD) 240 MG 24 hr capsule Take 1 capsule (240 mg total) by mouth daily.   divalproex (DEPAKOTE ER) 500 MG 24 hr tablet Take 1 tablet (500 mg total) by mouth daily AND 2 tablets (1,000 mg total) at bedtime.   Dulaglutide (TRULICITY) 3 MG/0.5ML SOPN Inject 3 mg as directed once a week.   ELDERBERRY PO Take 4 g by mouth daily. Gummies 2g each   EPINEPHrine 0.3 mg/0.3 mL IJ SOAJ injection Inject 0.3 mg into the muscle once as needed for anaphylaxis.   ferrous sulfate 324 MG TBEC Take 324 mg by mouth every evening.   fluticasone (FLOVENT HFA) 110 MCG/ACT inhaler Inhale 2 puffs into the lungs 2 (two) times daily.   fluticasone furoate-vilanterol (BREO ELLIPTA) 100-25 MCG/ACT AEPB INHALE 1 PUFF INTO LUNGS DAILY (BULK)   gabapentin (NEURONTIN) 600 MG tablet Take 600 mg by mouth 3 (three) times daily.   hydrOXYzine (ATARAX) 25 MG tablet Take 1 tablet (25 mg total) by mouth 3 (three) times daily as needed for anxiety or itching.   Immune Globulin, Human, (CUVITRU [place]) Inject 25 mg into the skin every 14 (fourteen) days.   insulin glargine (LANTUS SOLOSTAR) 100 UNIT/ML Solostar Pen Inject 50 Units into the skin at bedtime.   Insulin Pen Needle (PEN NEEDLES 3/16") 31G X 5 MM MISC Use as directed with insulin pen   levalbuterol (XOPENEX HFA)  45 MCG/ACT inhaler Inhale 2 puffs into the lungs every 6 (six) hours as needed.   levalbuterol (XOPENEX) 1.25 MG/3ML nebulizer solution Inhale 1.25 mg into the lungs daily as needed (asthma).   levofloxacin (LEVAQUIN) 500 MG tablet Take 1 tablet (500 mg total) by mouth daily for 10 days.   levothyroxine (SYNTHROID, LEVOTHROID) 112 MCG tablet Take 112 mcg by mouth daily before breakfast.   lidocaine-prilocaine (EMLA) cream Apply 1 application  topically as needed [facility]).   linaclotide (LINZESS) 145 MCG CAPS capsule Take 1 capsule (145 mcg total) by mouth daily before breakfast. Office visit needed before further refills   losartan (COZAAR) 25 MG tablet Take 1 tablet (25 mg total) by mouth daily.   MELATONIN GUMMIES PO Take 10 mg by mouth at bedtime.   metFORMIN (GLUCOPHAGE) 500 MG tablet Take 500 mg by mouth 2 (two) times daily.   metoCLOPramide (REGLAN) 5 MG tablet Take 5 mg by mouth 3 (three) times daily.   montelukast (SINGULAIR) 10 MG tablet Take 10 mg by mouth at bedtime.   Multiple Vitamins-Minerals (MULTIVITAMIN WITH MINERALS) tablet Take 1 tablet by mouth daily. Woman   Nebulizer MISC Nebulizer tubing kit   olmesartan (BENICAR) 40 MG tablet Take 40 mg by mouth daily.   omeprazole (PRILOSEC) 20 MG capsule TAKE 1 CAPSULE(20 MG) BY MOUTH TWICE DAILY BEFORE A MEAL   ondansetron (ZOFRAN-ODT) 8 MG disintegrating tablet Take 8 mg by mouth daily as needed for vomiting or nausea.   ONETOUCH ULTRA test strip 1 each by Other route in the morning, at noon, in the evening, and at bedtime.  oxyCODONE-acetaminophen (PERCOCET) 5-325 MG tablet Take 1 tablet by mouth every 6 (six) hours as needed.   polyethylene glycol (MIRALAX / GLYCOLAX) 17 g packet Take 17 g by mouth daily as needed. (Patient taking differently: Take 17 g by mouth daily as needed for moderate constipation or severe constipation.)   Potassium Chloride ER 20 MEQ TBCR Take 20 mEq by mouth daily.   Spacer/Aero-Holding Chambers (AEROCHAMBER  PLUS) inhaler 1 each by Other route as needed for other. Use as instructed   topiramate (TOPAMAX) 100 MG tablet Take 1 tablet (100 mg total) by mouth 2 (two) times daily.   torsemide (DEMADEX) 20 MG tablet Take 1 tablet daily for fluid gain of 3 lb overnight or 5 lbs in a week. (Patient taking differently: as needed. Take 1 tablet daily for fluid gain of 3 lb overnight or 5 lbs in a week.)   triamcinolone cream (KENALOG) 0.1 % Apply 1 application. topically daily as needed (foot).   vitamin B-12 (CYANOCOBALAMIN) 1000 MCG tablet Take 1,000 mcg by mouth daily.   vitamin C (ASCORBIC ACID) 500 MG tablet Take 500 mg by mouth daily. Power C immune support   [DISCONTINUED] Dulaglutide (TRULICITY) 3 XE/9.4MH SOPN Inject 3 mg as directed once a week. (Patient taking differently: Inject 1.5 mg as directed once a week.)   [DISCONTINUED] insulin glargine (LANTUS SOLOSTAR) 100 UNIT/ML Solostar Pen Inject 80 Units into the skin at bedtime. (Patient taking differently: Inject 40 Units into the skin at bedtime.)   Facility-Administered Encounter Medications as of 06/08/2022  Medication   Benralizumab SOSY 30 mg    ALLERGIES: Allergies  Allergen Reactions   Ativan [Lorazepam] Hives   Phenergan [Promethazine Hcl] Hives    VACCINATION STATUS: Immunization History  Administered Date(s) Administered   Influenza Split 08/24/2018   Influenza Whole 07/15/2019   Influenza,inj,Quad PF,6+ Mos 07/10/2017, 10/27/2021   Moderna Sars-Covid-2 Vaccination 12/31/2019, 01/30/2020   Pneumococcal-Unspecified 10/10/2012    Diabetes She presents for her follow-up diabetic visit. She has type 2 diabetes mellitus. Onset time: She was diagnosed at approximate age of 45 years, stated by gestational diabetes with all 3 of her pregnancies in her 47s. Her disease course has been worsening (Since her last visit, she fell and injured herself.  CT scan of the head did not show any intracranial bleeding.[place]). There are no hypoglycemic  associated symptoms. Pertinent negatives for hypoglycemia include no confusion, headaches, pallor or seizures. Associated symptoms include fatigue. Pertinent negatives for diabetes include no blurred vision, no chest pain, no polydipsia, no polyphagia and no polyuria. There are no hypoglycemic complications. Symptoms are worsening. Diabetic complications include a CVA and heart disease. (She has history of open heart surgery for ASD in 1965.) Risk factors for coronary artery disease include dyslipidemia, diabetes mellitus, family history, obesity, hypertension, post-menopausal, sedentary lifestyle and tobacco exposure. Current diabetic treatments: She is currently on Lantus 45 units nightly, Humalog 7 units 3 times daily AC, Metformin 1000 g p.o. twice daily, Trulicity 1.5 mg subcutaneou 50 units nig. Her weight is fluctuating minimally. She is following a generally unhealthy diet. When asked about meal planning, she reported none. She has not had a previous visit with a dietitian. She rarely (She has medical problem with bronchiectasis for which she gets treated with steroids on and off.) participates in exercise. Her home blood glucose trend is increasing steadily. Her breakfast blood glucose range is generally >200 mg/dl. Her lunch blood glucose range is generally >200 mg/dl. Her dinner blood glucose range is generally >200  mg/dl. Her bedtime blood glucose range is generally >200 mg/dl. Her overall blood glucose range is >200 mg/dl. (Laura [name] presents with worsening glycemic profile compared to last visit.  Her freestyle libre device was downloaded.  AGP report shows 32% time range, 38% level 1 hyperglycemia, 30% level 2 hyperglycemia.  She does not have any hypoglycemia.  Her point-of-care A1c higher at 8.3% increasing from 7.8%.   ) An ACE inhibitor/angiotensin II receptor blocker is being taken.  Hyperlipidemia This is a chronic problem. The current episode started more than 1 year ago. Exacerbating  diseases include diabetes and obesity. Associated symptoms include myalgias. Pertinent negatives include no chest pain or shortness of breath. Current antihyperlipidemic treatment includes statins. Risk factors for coronary artery disease include dyslipidemia, diabetes mellitus, family history, obesity, hypertension, a sedentary lifestyle and post-menopausal.  Hypertension This is a chronic problem. The current episode started more than 1 year ago. The problem is uncontrolled. Pertinent negatives include no blurred vision, chest pain, headaches, palpitations or shortness of breath. Risk factors for coronary artery disease include dyslipidemia, diabetes mellitus, obesity, sedentary lifestyle and smoking/tobacco exposure. Past treatments include ACE inhibitors. Hypertensive end-organ damage includes CVA.  Other The current episode started more than 1 month ago. The problem occurs constantly. Progression since onset: Patient reports that she did have exposure to steroids related to back pain on 2 separate occasions between November 2000 21 and 2022.  Last exposure was related to her COVID infection.  She reports that she developed fatigueced that she developed fatigue. Associated symptoms include fatigue and myalgias. Pertinent negatives include no arthralgias, chest pain, chills, coughing, fever, headaches, nausea, rash or vomiting. She has tried nothing for the symptoms. The treatment provided no relief.     Objective:    BP (!) 110/58   Pulse 68   Ht $R'5\' 4"'RQ$  (1.626 m)   Wt 220 lb 6.4 oz (100 kg)   BMI 37.83 kg/m   Wt Readings from Last 3 Encounters:  06/08/22 220 lb (99.8 kg)  06/08/22 220 lb 6.4 oz (100 kg)  06/03/22 221 lb (100.2 kg)         Latest Ref Rng & Units 03/31/2022   12:00 AM 03/18/2022    2:25 PM 03/14/2022    5:33 PM  CMP  Glucose 70 - 99 mg/dL  82  85   BUN 4 - $R'21 20     21  18   'lc$ Creatinine 0.5 - 1.1 0.7     0.69  0.75   Sodium 137 - 147 142     139  140   Potassium 3.5 - 5.1  mEq/L 4.4     3.9  3.9   Chloride 99 - 108 108     114  116   CO2 13 - $Re'22 20     19  20   'OwB$ Calcium 8.7 - 10.7 9.3     9.3  8.7   Total Protein 6.5 - 8.1 g/dL   6.4   Total Bilirubin 0.3 - 1.2 mg/dL   0.5   Alkaline Phos 25 - 125 72      60   AST 13 - 35 10      10   ALT 7 - 35 U/L 13      11      This result is from an external source.   Lipid Panel     Component Value Date/Time   CHOL 138 03/31/2022 0000   TRIG 82 03/31/2022 0000  HDL 44 03/31/2022 0000   CHOLHDL 3.1 05/20/2021 1048   VLDL 22 05/20/2021 1048   LDLCALC 78 03/31/2022 0000    Latest Reference Range & Units Most Recent  Cortisol, Plasma ug/dL 7.3 01/13/22 13:10  Glucose  105 (E) 03/31/22 00:00  Hemoglobin A1C  7.5 (E) 11/18/21 00:00  HbA1c, POC (controlled diabetic range) 0.0 - 7.0 % 8.3 ! 06/08/22 10:29  TSH 0.41 - 5.90  1.54 (E) 03/31/22 00:00  T4,Free(Direct) 0.61 - 1.12 ng/dL 1.13 (H) 10/22/21 12:09  !: Data is abnormal (H): Data is abnormally high (E): External lab result Assessment & Plan:     1. DM type 2 causing vascular disease ([place])  Laura [name] presents with worsening glycemic profile compared to last visit.  Her freestyle libre device was downloaded.  AGP report shows 32% time range, 38% level 1 hyperglycemia, 30% level 2 hyperglycemia.  She does not have any hypoglycemia.  Her point-of-care A1c higher at 8.3% increasing from 7.8%.    -her diabetes is complicated by CVA, CHF, history of open heart surgery for ASD, obesity/sedentary life, history of chronic smoking with bronchiectasis and Laura [name] remains at a high risk for more acute and chronic complications which include CAD, CVA, CKD, retinopathy, and neuropathy. These are all discussed in detail with the patient.  - I have counseled her on diet management and weight loss, by adopting a carbohydrate restricted/protein rich diet.  - she acknowledges that there is a room for improvement in her food and drink choices. - Suggestion is made for  her to avoid simple carbohydrates  from her diet including Cakes, Sweet Desserts, Ice Cream, Soda (diet and regular), Sweet Tea, Candies, Chips, Cookies, Store Bought Juices, Alcohol , Artificial Sweeteners,  Coffee Creamer, and "Sugar-free" Products, Lemonade. This will help patient to have more stable blood glucose profile and potentially avoid unintended weight gain.  The following Lifestyle Medicine recommendations according to [place]  [facility]) were discussed and and offered to patient and she  agrees to start the journey:  A. Whole Foods, Plant-Based Nutrition comprising of fruits and vegetables, plant-based proteins, whole-grain carbohydrates was discussed in detail with the patient.   A list for source of those nutrients were also provided to the patient.  Patient will use only water or unsweetened tea for hydration. B.  The need to stay away from risky substances including alcohol, smoking; obtaining 7 to 9 hours of restorative sleep, at least 150 minutes of moderate intensity exercise weekly, the importance of healthy social connections,  and stress management techniques were discussed. C.  A full color page of  Calorie density of various food groups per pound showing examples of each food groups was provided to the patient.   - she is advised to stick to a routine mealtimes to eat 3 meals  a day and avoid unnecessary snacks ( to snack only to correct hypoglycemia).   - she will be scheduled with Jearld Fenton, RDN, CDE for individualized diabetes education.  - I have approached her with the following individualized plan to manage diabetes and patient agrees:   -Her Lantus was lowered to 40 units due to the fact that she called for hypoglycemia, however returns with loss of control of diabetes.  -She is advised to increase her Lantus to 50 units nightly,   associated with continuous monitoring of blood glucose using her CGM.      She is advised to call clinic  for blood glucose readings above  200 mg per DL x3, or less than 70 mg per DL.   - Patient is warned not to take insulin without proper monitoring per orders. - She has tolerated Trulicity 3 mg subcutaneously weekly.   -She is advised to continue metformin 500 mg p.o. twice daily.  - Patient specific target  A1c;  LDL, HDL, Triglycerides,  were discussed in detail.  2.  BP/HTN:  -Her blood pressure is controlled to target.  She advised to continue her current blood pressure medications including hydralazine 25 mg 3 times daily.    3) Lipids/HPL: Her most recent lipid panel showed LDL of 78.  She is advised to continue atorvastatin 10 mg p.o. nightly.   Side effects and precautions discussed with her   4)  Weight/Diet: Her BMI is 86.57--QIONGEX complicating her diabetes care.  She is a candidate for modest weight loss.   The above discussed dietary plan will help achieve weight loss. -She is working with her dietitian.     5 hypothyroidism-long-standing, etiology unclear, she has multiple family members with thyroid dysfunctions. Her previsit thyroid function tests are consistent with appropriate replacement.  She is advised to continue levothyroxine 112 mcg p.o. daily before breakfast.    - We discussed about the correct intake of her thyroid hormone, on empty stomach at fasting, with water, separated by at least 30 minutes from breakfast and other medications,  and separated by more than 4 hours from calcium, iron, multivitamins, acid reflux medications (PPIs). -Patient is made aware of the fact that thyroid hormone replacement is needed for life, dose to be adjusted by periodic monitoring of thyroid function tests.    6) vitamin D deficiency: - She is currently on  vitamin D supplements including vitamin D3 5000 units daily.     7) Chronic Care/Health Maintenance:  -she  is on  Statin medications and  is encouraged to continue to follow up with Ophthalmology, Dentist,  Podiatrist at  least yearly or according to recommendations, and advised to  stay away from smoking. I have recommended yearly flu vaccine and pneumonia vaccination at least every 5 years;  and  sleep for at least 7 hours a day.  - I advised patient to maintain close follow up with [name], MD for primary care needs.   I spent 42 minutes in the care of the patient today including review of labs from [place], Lipids, Thyroid Function, Hematology (current and previous including abstractions from other facilities); face-to-face time discussing  her blood glucose readings/logs, discussing hypoglycemia and hyperglycemia episodes and symptoms, medications doses, her options of short and long term treatment based on the latest standards of care / guidelines;  discussion about incorporating lifestyle medicine;  and documenting the encounter. Risk reduction counseling performed per USPSTF guidelines to reduce obesity and cardiovascular risk factors.     Please refer to Patient Instructions for Blood Glucose Monitoring and Insulin/Medications Dosing Guide"  in media tab for additional information. Please  also refer to " Patient Self Inventory" in the Media  tab for reviewed elements of pertinent patient history.  Laura [name] participated in the discussions, expressed understanding, and voiced agreement with the above plans.  All questions were answered to her satisfaction. she is encouraged to contact clinic should she have any questions or concerns prior to her return visit.    Follow up plan: - Return in about 7 weeks (around 07/27/2022) for F/U with Meter/CGM [facility] Only - no Labs.  Glade Lloyd, MD [place]  [facility] [address] [place], [place] [address] Phone: [phone]  Fax: [phone]    06/08/2022, 12:30 PM  This note was partially dictated with voice recognition software. Similar sounding words can be transcribed inadequately or may not  be corrected  upon review.

## 2022-06-08 NOTE — Progress Notes (Signed)
Medical Nutrition Therapy  Appointment Start time:  1117 Appointment End time: [place]  Follow up DM Type 2 Saw [name] today. Increased her Lantus to 50 units due to elevated A1C.. A1C 8.3% up from 7.3%. Had been on prednisone.  Trulicity same at 3.0. Metformin 1000 mg BID.  Has been eating more at night causing her BS to be elevated. Doesn't eat much during the day.   Lost appetite since COVID. Has history of eating disorder. Works with a Transport planner. Has been cooking for herself more now since she and her sister are the only ones living in her house now. FBS 180-200's Bedtime 180-200's.  Anthropometrics  Wt Readings from Last 3 Encounters:  06/08/22 220 lb 6.4 oz (100 kg)  06/03/22 221 lb (100.2 kg)  06/02/22 224 lb 6.4 oz (101.8 kg)   Ht Readings from Last 3 Encounters:  06/08/22 '5\' 4"'$  (1.626 m)  06/03/22 '5\' 4"'$  (1.626 m)  06/02/22 '5\' 4"'$  (1.626 m)   There is no height or weight on file to calculate BMI. '@BMIFA'$ @ Facility age limit for growth %iles is 20 years. Facility age limit for growth %iles is 20 years.   FBS: 128 mg/dl.  Range 124-128 mg/dl..  Clinical Medical Hx: CHF and DM,  Medications: Lantus 50 units a day now, Trulicity weekly and Metformin.  Labs:  Lab Results  Component Value Date   HGBA1C 8.3 (A) 06/08/2022      Latest Ref Rng & Units 03/31/2022   12:00 AM 03/18/2022    2:25 PM 03/14/2022    5:33 PM  CMP  Glucose 70 - 99 mg/dL  82  85   BUN 4 - '21 20     21  18   '$ Creatinine 0.5 - 1.1 0.7     0.69  0.75   Sodium 137 - 147 142     139  140   Potassium 3.5 - 5.1 mEq/L 4.4     3.9  3.9   Chloride 99 - 108 108     114  116   CO2 13 - '22 20     19  20   '$ Calcium 8.7 - 10.7 9.3     9.3  8.7   Total Protein 6.5 - 8.1 g/dL   6.4   Total Bilirubin 0.3 - 1.2 mg/dL   0.5   Alkaline Phos 25 - 125 72      60   AST 13 - 35 10      10   ALT 7 - 35 U/L 13      11      This result is from an external source.   Notable Signs/Symptoms:     Lifestyle & Dietary  Lives with her sister.   Estimated daily fluid intake:  64 oz   Sleep: varies  Stress / self-care: stress eater Current average weekly physical activity: ADL  B) Banana L) Leftover or lowcarb wraps with chicken salad. D) Hamburger with sweet potato, broccoli   Estimated Energy Needs Calories: 1200 Carbohydrate: 135g Protein: 90g Fat: 33g   NUTRITION DIAGNOSIS  NB-1.1 Food and nutrition-related knowledge deficit As related to Diabetes Type 2.  As evidenced by A1C 7.7%.   NUTRITION INTERVENTION  Nutrition education (E-1) on the following topics:  Lifestyle Medicine - Whole Food, Plant Predominant Nutrition is highly recommended: Eat Plenty of vegetables, Mushrooms, fruits, Legumes, Whole Grains, Nuts, seeds in lieu of processed meats, processed snacks/pastries red meat, poultry, eggs.    -  It is better to avoid simple carbohydrates including: Cakes, Sweet Desserts, Ice Cream, Soda (diet and regular), Sweet Tea, Candies, Chips, Cookies, Store Bought Juices, Alcohol in Excess of  1-2 drinks a day, Lemonade,  Artificial Sweeteners, Doughnuts, Coffee Creamers, "Sugar-free" Products, etc, etc.  This is not a complete list.....  Exercise: If you are able: 30 -60 minutes a day ,4 days a week, or 150 minutes a week.  The longer the better.  Combine stretch, strength, and aerobic activities.  If you were told in the past that you have high risk for cardiovascular diseases, you may seek evaluation by your heart doctor prior to initiating moderate to intense exercise programs.  Handouts Provided Include  .Nutrition Lifestyle handout  Learning Style & Readiness for Change Teaching method utilized: Visual & Auditory  Demonstrated degree of understanding via: Teach Back  Barriers to learning/adherence to lifestyle change: none  Goals Established by Pt   Try to get to eating 3 meals per day Get a face and face visit with therapist. Try not to eat late at night  after 7 pm.  MONITORING & EVALUATION Dietary intake, weekly physical activity, and blood sugars in 3 month. Increase Lantus to 50 units at night per [name]..  Next Steps  Get back on track with meal planning and eating meals on time-more plant based foods.

## 2022-06-08 NOTE — Patient Instructions (Signed)
Goals  Try to get to eating 3 meals per day Get a face and face visit with therapist. Try not to eat late at night after 7 pm.

## 2022-06-08 NOTE — Patient Instructions (Signed)
                                     Advice for Weight Management  -For most of us the best way to lose weight is by diet management. Generally speaking, diet management means consuming less calories intentionally which over time brings about progressive weight loss.  This can be achieved more effectively by avoiding ultra processed carbohydrates, processed meats, unhealthy fats.    It is critically important to know your numbers: how much calorie you are consuming and how much calorie you need. More importantly, our carbohydrates sources should be unprocessed naturally occurring  complex starch food items.  It is always important to balance nutrition also by  appropriate intake of proteins (mainly plant-based), healthy fats/oils, plenty of fruits and vegetables.   -The American College of Lifestyle Medicine (ACL M) recommends nutrition derived mostly from Whole Food, Plant Predominant Sources example an apple instead of applesauce or apple pie. Eat Plenty of vegetables, Mushrooms, fruits, Legumes, Whole Grains, Nuts, seeds in lieu of processed meats, processed snacks/pastries red meat, poultry, eggs.  Use only water or unsweetened tea for hydration.  The College also recommends the need to stay away from risky substances including alcohol, smoking; obtaining 7-9 hours of restorative sleep, at least 150 minutes of moderate intensity exercise weekly, importance of healthy social connections, and being mindful of stress and seek help when it is overwhelming.    -Sticking to a routine mealtime to eat 3 meals a day and avoiding unnecessary snacks is shown to have a big role in weight control. Under normal circumstances, the only time we burn stored energy is when we are hungry, so allow  some hunger to take place- hunger means no food between appropriate meal times, only water.  It is not advisable to starve.   -It is better to avoid simple carbohydrates including:  Cakes, Sweet Desserts, Ice Cream, Soda (diet and regular), Sweet Tea, Candies, Chips, Cookies, Store Bought Juices, Alcohol in Excess of  1-2 drinks a day, Lemonade,  Artificial Sweeteners, Doughnuts, Coffee Creamers, "Sugar-free" Products, etc, etc.  This is not a complete list.....    -Consulting with certified diabetes educators is proven to provide you with the most accurate and current information on diet.  Also, you may be  interested in discussing diet options/exchanges , we can schedule a visit with Laura [name], RDN, CDE for individualized nutrition education.  -Exercise: If you are able: 30 -60 minutes a day ,4 days a week, or 150 minutes of moderate intensity exercise weekly.    The longer the better if tolerated.  Combine stretch, strength, and aerobic activities.  If you were told in the past that you have high risk for cardiovascular diseases, or if you are currently symptomatic, you may seek evaluation by your heart doctor prior to initiating moderate to intense exercise programs.                                  Additional Care Considerations for Diabetes/Prediabetes   -Diabetes  is a chronic disease.  The most important care consideration is regular follow-up with your diabetes care provider with the goal being avoiding or delaying its complications and to take advantage of advances in medications and technology.  If appropriate actions are taken early enough, type 2 diabetes can even be   reversed.  Seek information from the right source.  - Whole Food, Plant Predominant Nutrition is highly recommended: Eat Plenty of vegetables, Mushrooms, fruits, Legumes, Whole Grains, Nuts, seeds in lieu of processed meats, processed snacks/pastries red meat, poultry, eggs as recommended by American College of  Lifestyle Medicine (ACLM).  -Type 2 diabetes is known to coexist with other important comorbidities such as high blood pressure and high cholesterol.  It is critical to control not only the  diabetes but also the high blood pressure and high cholesterol to minimize and delay the risk of complications including coronary artery disease, stroke, amputations, blindness, etc.  The good news is that this diet recommendation for type 2 diabetes is also very helpful for managing high cholesterol and high blood blood pressure.  - Studies showed that people with diabetes will benefit from a class of medications known as ACE inhibitors and statins.  Unless there are specific reasons not to be on these medications, the standard of care is to consider getting one from these groups of medications at an optimal doses.  These medications are generally considered safe and proven to help protect the heart and the kidneys.    - People with diabetes are encouraged to initiate and maintain regular follow-up with eye doctors, foot doctors, dentists , and if necessary heart and kidney doctors.     - It is highly recommended that people with diabetes quit smoking or stay away from smoking, and get yearly  flu vaccine and pneumonia vaccine at least every 5 years.  See above for additional recommendations on exercise, sleep, stress management , and healthy social connections.      

## 2022-06-09 DIAGNOSIS — D839 Common variable immunodeficiency, unspecified: Secondary | ICD-10-CM | POA: Diagnosis not present

## 2022-06-09 LAB — IGG 1, 2, 3, AND 4
IgG (Immunoglobin G), Serum: 1212 mg/dL (ref 586–1602)
IgG, Subclass 1: 735 mg/dL (ref 248–810)
IgG, Subclass 2: 319 mg/dL (ref 130–555)
IgG, Subclass 3: 50 mg/dL (ref 15–102)
IgG, Subclass 4: 19 mg/dL (ref 2–96)

## 2022-06-09 LAB — IGG, IGA, IGM
IgA/Immunoglobulin A, Serum: 43 mg/dL — ABNORMAL LOW (ref 87–352)
IgM (Immunoglobulin M), Srm: 65 mg/dL (ref 26–217)

## 2022-06-10 DIAGNOSIS — M170 Bilateral primary osteoarthritis of knee: Secondary | ICD-10-CM | POA: Diagnosis not present

## 2022-06-10 NOTE — Telephone Encounter (Signed)
Referral has been placed. Please see encounter from 12-08-2021.

## 2022-06-10 NOTE — Telephone Encounter (Signed)
Referral has been placed to Dr. Doristine Devoid office with new diagnosis of neuropathy.   [facility] [address], [place],  [place]  [address] Ph:[phone]  Called and advised patient of referral. Advised patient to reach out to Dr. Doristine Devoid office to get scheduled if she has not heard from them in 3-5 business days. Patient verbalized understanding.

## 2022-06-13 DIAGNOSIS — G4733 Obstructive sleep apnea (adult) (pediatric): Secondary | ICD-10-CM | POA: Diagnosis not present

## 2022-06-14 ENCOUNTER — Ambulatory Visit (INDEPENDENT_AMBULATORY_CARE_PROVIDER_SITE_OTHER): Payer: Medicare Other | Admitting: Clinical

## 2022-06-14 DIAGNOSIS — F4310 Post-traumatic stress disorder, unspecified: Secondary | ICD-10-CM | POA: Diagnosis not present

## 2022-06-14 NOTE — Progress Notes (Signed)
Virtual Visit via Video Note   I connected with Laura [name] on 06/14/2022 at  2:00 PM EST by a video enabled telemedicine application and verified that I am speaking with the correct person using two identifiers.   Location: Patient: Home Provider: Office   I discussed the limitations of evaluation and management by telemedicine and the availability of in person appointments. The patient expressed understanding and agreed to proceed.   THERAPIST PROGRESS NOTE   Session Time: 2:00 PM-:2:55 PM   Participation Level: Active   Behavioral Response: CasualAlertDepressed   Type of Therapy: Individual Therapy   Treatment Goals addressed: Coping   Interventions: CBT, DBT, Solution Focused, Strength-based and Supportive   Summary: Laura [name] is a 61 y.o. female who presents with PTSD. The OPT therapist worked with the patient for her ongoing OPT treatment session. The OPT therapist utilized Motivational Interviewing to assist in creating therapeutic repore.The patient in the session was engaged and work in collaboration giving feedback about her triggers and symptoms over the past few weeks.The patient spoke about being at her daughters in [place]  and will be there visiting until mid September. The patient spoke about her upcoming scheduled foot surgery in October.The OPT therapist utilized Cognitive Behavioral Therapy through cognitive restructuring as well as worked with the patient on coping strategies to assist in management of mental health symptoms. The patient verbalized, " The trip has been really great so far its been good to spend time with the family and go to the ocean".  The OPT therapist continued to urge the patient  to try to be active in her limits and she has been pacing but active while being on her family visit with her daughters family in [place]. The OPT therapist placed emphasis on the patient keeping all upcoming health appointments and adhering to ongoing health  treatment recommendations. The OPT therapist reviewed all appointments listed coming up in the patients mychart.    Suicidal/Homicidal: Nowithout intent/plan   Therapist Response:The OPT therapist worked with the patient for the patients scheduled session. The patient was engaged in her session and gave feedback in relation to triggers, symptoms, and behavior responses over the past few weeks. The OPT therapist worked with the patient utilizing an in session Cognitive Behavioral Therapy exercise.  The patient spoke about  enjoying being on her family visit trip in [place] and working to pace herself due to her heart condition.. The patient will be  in [place] till mid September and has foot surgery scheduled for mid October. The OPT therapist will continue treatment work with the patient in her next scheduled session.   Plan: Return again in 2/3 weeks.   Diagnosis:      Axis I:Post Traumatic Stress Disorder                           Axis II: No diagnosis     Collaboration of Care: No additional collaboration for this scheduled session.   Patient/Guardian was advised Release of Information must be obtained prior to any record release in order to collaborate their care with an outside provider. Patient/Guardian was advised if they have not already done so to contact the registration department to sign all necessary forms in order for [place] to release information regarding their care.    Consent: Patient/Guardian gives verbal consent for treatment and assignment of benefits for services provided during this visit. Patient/Guardian expressed understanding and agreed to proceed.  I discussed the assessment and treatment plan with the patient. The patient was provided an opportunity to ask questions and all were answered. The patient agreed with the plan and demonstrated an understanding of the instructions.   The patient was advised to call back or seek an in-person evaluation if the symptoms  worsen or if the condition fails to improve as anticipated.   I provided 55 minutes of non-face-to-face time during this encounter.   [name], LCSW   06/14/2022

## 2022-06-19 ENCOUNTER — Other Ambulatory Visit: Payer: Self-pay | Admitting: "Endocrinology

## 2022-06-19 DIAGNOSIS — E1165 Type 2 diabetes mellitus with hyperglycemia: Secondary | ICD-10-CM

## 2022-06-22 ENCOUNTER — Telehealth: Payer: Self-pay

## 2022-06-22 NOTE — Telephone Encounter (Signed)
Laura [name] is having surgery on 07/29/2022 with Dr. Sherryle Lis. Left a message for her to call me so we can go over medications to hold for surgery.

## 2022-06-22 NOTE — Telephone Encounter (Signed)
Laura [name] returned my call. She is to hold Trulicity 7 days prior to her surgery on 07/29/2022. She stated she understood this.

## 2022-06-24 ENCOUNTER — Other Ambulatory Visit: Payer: Self-pay | Admitting: "Endocrinology

## 2022-06-24 ENCOUNTER — Ambulatory Visit: Payer: Medicare Other | Admitting: Allergy & Immunology

## 2022-06-28 ENCOUNTER — Encounter: Payer: Medicare Other | Admitting: Pulmonary Disease

## 2022-06-28 DIAGNOSIS — D839 Common variable immunodeficiency, unspecified: Secondary | ICD-10-CM | POA: Diagnosis not present

## 2022-06-28 NOTE — Progress Notes (Signed)
Remote pacemaker transmission.   

## 2022-06-30 ENCOUNTER — Telehealth: Payer: Self-pay | Admitting: Urology

## 2022-06-30 NOTE — Telephone Encounter (Signed)
DOS - 07/29/22  DOUBLE OSTEOTOMY RIGHT --- 28299 METATARSAL OSTEOTOMY RIGHT --- [id number] H[place] REPAIR RIGHT --- [id number] RIGHT --- [id number]   [facility] EFFECTIVE DATE - 01/08/22  PLAN DEDUCTIBLE - $0.00 OUT OF POCKET - $8,300.00 W/ $0.00 REMAINING COINSURANCE - 20% COPAY - $0.00   PER [employer] WEBSITE FOR CPT CODES 43837, [phone], [phone] AND [id number] HAS BEEN APPROVED, AUTH # A [id number], GOOD FROM 07/29/22 - 10/27/22

## 2022-07-01 DIAGNOSIS — E1165 Type 2 diabetes mellitus with hyperglycemia: Secondary | ICD-10-CM | POA: Diagnosis not present

## 2022-07-01 DIAGNOSIS — M170 Bilateral primary osteoarthritis of knee: Secondary | ICD-10-CM | POA: Diagnosis not present
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
## 2022-07-04 ENCOUNTER — Ambulatory Visit: Payer: Medicare Other | Attending: Internal Medicine | Admitting: Nurse Practitioner

## 2022-07-04 DIAGNOSIS — Z01810 Encounter for preprocedural cardiovascular examination: Secondary | ICD-10-CM

## 2022-07-04 NOTE — Progress Notes (Signed)
Virtual Visit via Telephone Note   Because of Laura [name]'s co-morbid illnesses, she is at least at moderate risk for complications without adequate follow up.  This format is felt to be most appropriate for this patient at this time.  The patient did not have access to video technology/had technical difficulties with video requiring transitioning to audio format only (telephone).  All issues noted in this document were discussed and addressed.  No physical exam could be performed with this format.  Please refer to the patient's chart for her consent to telehealth for [facility].  Evaluation Performed:  Preoperative cardiovascular risk assessment _____________   Date:  07/04/2022   Patient ID:  Laura [name], DOB 02-28-1961, MRN [id number] Patient Location:  Home Provider location:   Office  Primary Care Provider:  Benita Stabile, MD Primary Cardiologist:  Dina Rich, MD  Chief Complaint / Patient Profile   61 y.o. y/o female with a h/o syncope, persistent atrial fibrillation, tachycardia-bradycardia syndrome s/p PPM in 02/2022, chronic diastolic heart failure, orthostatic hypotension, prior ASD repair, hypertension, TIA, type 2 diabetes, and hypothyroidism who is pending double osteotomy, hammertoe repair, metatarsal osteotomy, ligament repair on right foot on 07/29/2022 with Dr. Sharl Ma of [facility] and presents today for telephonic preoperative cardiovascular risk assessment.  Past Medical History    Past Medical History:  Diagnosis Date   Asthma    Atrial fibrillation (HCC)    Bipolar disorder (HCC)    Bronchiectasis (HCC)    Carpal tunnel syndrome of right wrist    CHF (congestive heart failure) (HCC)    Complication of anesthesia    "my Dr in [place] told me I had an allergic reaction to the combination of the pain meds and the anesthesia" she does not remember what happened but "I eneded up in the ICU for 5 days".   H/O congenital  atrial septal defect (ASD) repair    Hypogammaglobulinemia (HCC)    Hypothyroid    IgE deficiency (HCC)    Neuropathy    Neuropathy    Osteoarthritis    Pinched nerve in neck    PTSD (post-traumatic stress disorder)    Recurrent sinusitis    Short-term memory loss    Sleep apnea    TIA (transient ischemic attack)    Tremors of nervous system    seeing PCP for this.   Type 2 diabetes mellitus (HCC)    Urticaria    Wound infection    Past Surgical History:  Procedure Laterality Date   ASD REPAIR  1968   BIOPSY  01/26/2021   Procedure: BIOPSY;  Surgeon: Lanelle Bal, DO;  [facility];  Service: Endoscopy;;  gastric   CARDIAC SURGERY     CARDIOVERSION     CARDIOVERSION N/A 08/29/2018   Procedure: CARDIOVERSION;  Surgeon: Antoine Poche, MD;  [facility]: [facility];  Service: Endoscopy;  Laterality: N/A;   CESAREAN SECTION  1986, 1988, 1994   COLONOSCOPY WITH PROPOFOL N/A 01/26/2021   Procedure: COLONOSCOPY WITH PROPOFOL;  Surgeon: Lanelle Bal, DO;  [facility];  Service: Endoscopy;  Laterality: N/A;  am appt, diabetic   ESOPHAGOGASTRODUODENOSCOPY (EGD) WITH PROPOFOL N/A 01/26/2021   Procedure: ESOPHAGOGASTRODUODENOSCOPY (EGD) WITH PROPOFOL;  Surgeon: Lanelle Bal, DO;  [facility];  Service: Endoscopy;  Laterality: N/A;   LEFT HEART CATH AND CORONARY ANGIOGRAPHY N/A 08/30/2019   Procedure: LEFT HEART CATH AND CORONARY ANGIOGRAPHY;  Surgeon: Lance Muss  S, MD;  Location: [place] [facility];  Service: Cardiovascular;  Laterality: N/A;   NASAL SINUS SURGERY  2017   PACEMAKER IMPLANT N/A 03/03/2022   Procedure: PACEMAKER IMPLANT;  Surgeon: [name], MD;  Location: [place] [facility];  Service: Cardiovascular;  Laterality: N/A;   POLYPECTOMY  01/26/2021   Procedure: POLYPECTOMY;  Surgeon: [name], DO;  [facility];  Service: Endoscopy;;  colon    Allergies  Allergies  Allergen Reactions    Ativan [Lorazepam] Hives   Phenergan [Promethazine Hcl] Hives    History of Present Illness    Laura [name] is a 61 y.o. female who presents via audio/video conferencing for a telehealth visit today.  Pt was last seen in cardiology clinic on 06/02/2022 by Dr. Lovena Le.  At that time Laura [name] was doing well. Bisoprolol was increased from 5 mg daily to 10 mg daily in the setting of elevated HR.The patient is now pending procedure as outlined above. Since her last visit, she has been stable from a cardiac standpoint.  She has stable chronic dyspnea in the setting of severe asthma.  She does note an increase in hand tremor since increasing her dose of bisoprolol.  Otherwise,she denies chest pain, palpitations, pnd, orthopnea, n, v, dizziness, syncope, edema, weight gain, or early satiety. All other systems reviewed and are otherwise negative except as noted above.   Home Medications    Prior to Admission medications   Medication Sig Start Date End Date Taking? Authorizing Provider  acetaminophen (TYLENOL) 325 MG tablet Take 2 tablets (650 mg total) by mouth every 6 (six) hours as needed for mild pain, fever or headache. 08/11/18   Roxan Hockey, MD  albuterol (PROVENTIL) (2.5 MG/3ML) 0.083% nebulizer solution Take 3 mLs (2.5 mg total) by nebulization every 4 (four) hours as needed for wheezing or shortness of breath. 04/07/21   Althea Charon, FNP  albuterol (VENTOLIN HFA) 108 (90 Base) MCG/ACT inhaler INHALE 2 PUFFS INTO THE LUNGS EVERY 6 HOURS AS NEEDED FOR WHEEZING OR SHORTNESS OF BREATH 04/07/21   Althea Charon, FNP  apixaban (ELIQUIS) 5 MG TABS tablet TAKE ONE TABLET BY MOUTH TWICE DAILY $RemoveBef'@9AM'NbAZyGbcQy$  & 5PM 05/17/22   Arnoldo Lenis, MD  atorvastatin (LIPITOR) 10 MG tablet Take 10 mg by mouth at bedtime.    [provider]  BD PEN NEEDLE NANO 2ND GEN 32G X 4 MM MISC 1 each by Other route in the morning, at noon, in the evening, and at bedtime. 05/16/22   Cassandria Anger, MD   Benralizumab (FASENRA PEN) 30 MG/ML SOAJ Inject 1 mL (30 mg total) into the skin every 28 (twenty-eight) days. Then every 8 weeks 02/15/22   Ambs, Kathrine Cords, FNP  bisoprolol (ZEBETA) 10 MG tablet Take 1 tablet (10 mg total) by mouth daily. 06/02/22   [name], MD  blood glucose meter kit and supplies 1 each by Other route 4 (four) times daily. Dispense based on patient and insurance preference. Use up to four times daily as directed. (FOR ICD-10 E10.9, E11.9). 01/27/21   Cassandria Anger, MD  Cholecalciferol (VITAMIN D3) 125 MCG (5000 UT) CAPS Take 1 capsule (5,000 Units total) by mouth daily. 11/25/20   Cassandria Anger, MD  Continuous Blood Gluc Sensor (FREESTYLE LIBRE 3 SENSOR) MISC 1 Piece by Does not apply route every 14 (fourteen) days. Place 1 sensor on the skin every 14 days. Use to check glucose continuously 10/28/21   Cassandria Anger, MD  CUVITRU 10 GM/50ML SOLN Inject 25 mg into the skin every 14 (fourteen) days. 01/24/22   [provider]  diltiazem (CARDIZEM CD) 240 MG 24 hr capsule Take 1 capsule (240 mg total) by mouth daily. 04/14/22   Arnoldo Lenis, MD  divalproex (DEPAKOTE ER) 500 MG 24 hr tablet Take 1 tablet (500 mg total) by mouth daily AND 2 tablets (1,000 mg total) at bedtime. 04/27/22 07/26/22  [name], MD  ELDERBERRY PO Take 4 g by mouth daily. Gummies 2g each    [provider]  EPINEPHrine 0.3 mg/0.3 mL IJ SOAJ injection Inject 0.3 mg into the muscle once as needed for anaphylaxis. 01/17/20   [provider]  ferrous sulfate 324 MG TBEC Take 324 mg by mouth every evening.    [provider]  fluticasone (FLOVENT HFA) 110 MCG/ACT inhaler Inhale 2 puffs into the lungs 2 (two) times daily. 12/31/21   Ambs, Kathrine Cords, FNP  fluticasone furoate-vilanterol (BREO ELLIPTA) 100-25 MCG/ACT AEPB INHALE 1 PUFF INTO LUNGS DAILY (BULK) 03/18/22   Rigoberto Noel, MD  gabapentin (NEURONTIN) 600 MG tablet Take 600 mg by mouth 3 (three) times  daily. 07/20/21   [provider]  Immune Globulin, Human, (CUVITRU [place]) Inject 25 mg into the skin every 14 (fourteen) days. 12/24/21   [provider]  Insulin Pen Needle (PEN NEEDLES 3/16") 31G X 5 MM MISC Use as directed with insulin pen 10/21/21   Cassandria Anger, MD  LANTUS SOLOSTAR 100 UNIT/ML Solostar Pen INJECT 70 UNITS SUBCUTANEOUSLY AT BEDTIME (BULK) 06/20/22   Nida, Marella Chimes, MD  levalbuterol [facility] HFA) 45 MCG/ACT inhaler Inhale 2 puffs into the lungs every 6 (six) hours as needed. 02/10/22   [provider]  levalbuterol Penne Lash) 1.25 MG/3ML nebulizer solution Inhale 1.25 mg into the lungs daily as needed (asthma). 02/09/22   [provider]  levothyroxine (SYNTHROID, LEVOTHROID) 112 MCG tablet Take 112 mcg by mouth daily before breakfast.    [provider]  lidocaine-prilocaine (EMLA) cream Apply 1 application  topically as needed [facility]). 12/03/21   [provider]  linaclotide Rolan Lipa) 145 MCG CAPS capsule Take 1 capsule (145 mcg total) by mouth daily before breakfast. Office visit needed before further refills 02/11/22   Mahala Menghini, PA-C  losartan (COZAAR) 25 MG tablet Take 1 tablet (25 mg total) by mouth daily. 02/18/22   [name], MD  MELATONIN GUMMIES PO Take 10 mg by mouth at bedtime.    [provider]  metFORMIN (GLUCOPHAGE) 500 MG tablet Take 500 mg by mouth 2 (two) times daily. 07/21/21   [provider]  metoCLOPramide (REGLAN) 5 MG tablet Take 5 mg by mouth 3 (three) times daily. 04/05/21   [provider]  montelukast (SINGULAIR) 10 MG tablet Take 10 mg by mouth at bedtime. 11/23/20   [provider]  Multiple Vitamins-Minerals (MULTIVITAMIN WITH MINERALS) tablet Take 1 tablet by mouth daily. Woman    [provider]  Nebulizer MISC Nebulizer tubing kit 10/21/20   Valentina Shaggy, MD  olmesartan (BENICAR) 40 MG tablet Take 40 mg by mouth daily. 03/21/22    [provider]  omeprazole (PRILOSEC) 20 MG capsule TAKE 1 CAPSULE(20 MG) BY MOUTH TWICE DAILY BEFORE A MEAL 04/07/22   Valentina Shaggy, MD  ondansetron (ZOFRAN-ODT) 8 MG disintegrating tablet Take 8 mg by mouth daily as needed for vomiting or nausea.    [provider]  [facility] ULTRA test strip 1  each by Other route in the morning, at noon, in the evening, and at bedtime. 10/17/19   [provider]  oxyCODONE-acetaminophen (PERCOCET) 5-325 MG tablet Take 1 tablet by mouth every 6 (six) hours as needed. 03/14/22   Milton Ferguson, MD  polyethylene glycol (MIRALAX / GLYCOLAX) 17 g packet Take 17 g by mouth daily as needed. Patient taking differently: Take 17 g by mouth daily as needed for moderate constipation or severe constipation. 04/17/21   [place] Donnamarie Poag, DO  Potassium Chloride ER 20 MEQ TBCR Take 20 mEq by mouth daily. 01/04/21   [provider]  Spacer/Aero-Holding Chambers (AEROCHAMBER PLUS) inhaler 1 each by Other route as needed for other. Use as instructed 02/07/22   Ambs, Kathrine Cords, FNP  topiramate (TOPAMAX) 100 MG tablet Take 1 tablet (100 mg total) by mouth 2 (two) times daily. 05/30/22   Rondel Jumbo, PA-C  torsemide (DEMADEX) 20 MG tablet Take 1 tablet daily for fluid gain of 3 lb overnight or 5 lbs in a week. Patient taking differently: as needed. Take 1 tablet daily for fluid gain of 3 lb overnight or 5 lbs in a week. 01/25/22   Arnoldo Lenis, MD  triamcinolone cream (KENALOG) 0.1 % Apply 1 application. topically daily as needed (foot). 03/15/21   [provider]  TRULICITY 3 JK/8.2SU SOPN INJECT $RemoveBef'3MG'fGuoNpAhMd$  AS DIRECTED ONCE A WEEK (BULK) 06/24/22   Nida, Marella Chimes, MD  vitamin B-12 (CYANOCOBALAMIN) 1000 MCG tablet Take 1,000 mcg by mouth daily.    [provider]  vitamin C (ASCORBIC ACID) 500 MG tablet Take 500 mg by mouth daily. Power C immune support    [provider]    Physical Exam    Vital Signs:  Laura [name]  does not have vital signs available for review today.  Given telephonic nature of communication, physical exam is limited. AAOx3. NAD. Normal affect.  Speech and respirations are unlabored.  Accessory Clinical Findings    None  Assessment & Plan    1.  Preoperative Cardiovascular Risk Assessment:  According to the Revised Cardiac Risk Index (RCRI), her Perioperative Risk of Major Cardiac Event is (%): 11. Her Functional Capacity in METs is: 6.45 according to the Duke Activity Status Index (DASI). Therefore, based on ACC/AHA guidelines, patient would be at acceptable risk for the planned procedure without further cardiovascular testing. The patient was advised that if she develops new symptoms prior to surgery to contact our office to arrange for a follow-up visit, and she verbalized understanding.  Per office protocol, patient can hold Eliquis for 2 days prior to procedure.   Patient will not need bridging with Lovenox (enoxaparin) around procedure.    A copy of this note will be routed to requesting surgeon.  Time:   Today, I have spent 13 minutes with the patient with telehealth technology discussing medical history, symptoms, and management plan.     Lenna Sciara, NP  07/04/2022, 9:15 AM

## 2022-07-05 ENCOUNTER — Other Ambulatory Visit: Payer: Self-pay | Admitting: Psychiatry

## 2022-07-05 ENCOUNTER — Ambulatory Visit (INDEPENDENT_AMBULATORY_CARE_PROVIDER_SITE_OTHER): Payer: Medicare Other | Admitting: Clinical

## 2022-07-05 DIAGNOSIS — F4310 Post-traumatic stress disorder, unspecified: Secondary | ICD-10-CM

## 2022-07-05 NOTE — Progress Notes (Signed)
Virtual Visit via Video Note   I connected with Laura [name] on 07/05/2022 at  10:00 AM EST by a video enabled telemedicine application and verified that I am speaking with the correct person using two identifiers.   Location: Patient: Home Provider: Office   I discussed the limitations of evaluation and management by telemedicine and the availability of in person appointments. The patient expressed understanding and agreed to proceed.   THERAPIST PROGRESS NOTE   Session Time: 10:00 AM-:10:55 AM   Participation Level: Active   Behavioral Response: CasualAlertDepressed   Type of Therapy: Individual Therapy   Treatment Goals addressed: Coping   Interventions: CBT, DBT, Solution Focused, Strength-based and Supportive   Summary: Laura [name] is a 61 y.o. female who presents with PTSD. The OPT therapist worked with the patient for her ongoing OPT treatment session. The OPT therapist utilized Motivational Interviewing to assist in creating therapeutic repore.The patient in the session was engaged and work in collaboration giving feedback about her triggers and symptoms over the past few weeks.The patient spoke about returning and being back in [place] from her daughters in [place].The OPT therapist utilized Cognitive Behavioral Therapy through cognitive restructuring as well as worked with the patient on coping strategies to assist in management of mental health symptoms. The patient verbalized, " The trip back went really well the last few days were kind of rough we weren't getting along really she was on her phone and not spending time with me so it made me mad she kept saying 2 weeks was to much and we started to get on each others nerves".  The patient noted she talked with her daughter since returning home and things are better. The OPT therapist placed emphasis on the patient keeping all upcoming health appointments and adhering to ongoing health treatment recommendations. The OPT therapist  reviewed all appointments listed coming up in the patients mychart.    Suicidal/Homicidal: Nowithout intent/plan   Therapist Response:The OPT therapist worked with the patient for the patients scheduled session. The patient was engaged in her session and gave feedback in relation to triggers, symptoms, and behavior responses over the past few weeks. The OPT therapist worked with the patient utilizing an in session Cognitive Behavioral Therapy exercise.  The patient spoke about the remainder of her [place] trip, return to [place], and adjustment in being back in [place]. The patient identified since being back in [place] having conflict with her daughter who lives in [place] who was upset with the patient for not inviting her to go with the patient out to [place]. The patient noted that since it was another daughters home she felt it was not her place to invite the daughter from [place]. The patient has completed yesterday with the cardiologist that check off and approved the patient for her upcoming foot surgery.The patient spoke about her podiatry appointment currently scheduled for 07/29/2022.The OPT therapist will continue treatment work with the patient in her next scheduled session.   Plan: Return again in 2/3 weeks.   Diagnosis:      Axis I:Post Traumatic Stress Disorder                           Axis II: No diagnosis     Collaboration of Care: No additional collaboration for this scheduled session.   Patient/Guardian was advised Release of Information must be obtained prior to any record release in order to collaborate their care with an outside provider. Patient/Guardian  was advised if they have not already done so to contact the registration department to sign all necessary forms in order for [place] to release information regarding their care.    Consent: Patient/Guardian gives verbal consent for treatment and assignment of benefits for services provided during this visit.  Patient/Guardian expressed understanding and agreed to proceed.      I discussed the assessment and treatment plan with the patient. The patient was provided an opportunity to ask questions and all were answered. The patient agreed with the plan and demonstrated an understanding of the instructions.   The patient was advised to call back or seek an in-person evaluation if the symptoms worsen or if the condition fails to improve as anticipated.   I provided 55 minutes of non-face-to-face time during this encounter.   [name], LCSW   07/05/2022

## 2022-07-06 ENCOUNTER — Telehealth (INDEPENDENT_AMBULATORY_CARE_PROVIDER_SITE_OTHER): Payer: Medicare Other | Admitting: Psychiatry

## 2022-07-06 ENCOUNTER — Encounter: Payer: Self-pay | Admitting: Psychiatry

## 2022-07-06 DIAGNOSIS — F0630 Mood disorder due to known physiological condition, unspecified: Secondary | ICD-10-CM | POA: Diagnosis not present

## 2022-07-06 MED ORDER — DIVALPROEX SODIUM ER 500 MG PO TB24: ORAL_TABLET | ORAL | 0 refills | Status: DC

## 2022-07-08 ENCOUNTER — Telehealth: Payer: Self-pay | Admitting: Neurology

## 2022-07-08 DIAGNOSIS — M170 Bilateral primary osteoarthritis of knee: Secondary | ICD-10-CM | POA: Diagnosis not present

## 2022-07-08 NOTE — Telephone Encounter (Signed)
Pt called in stating she is still having the tremors and wanted to see if she can increase the Topamax dosage?

## 2022-07-11 ENCOUNTER — Other Ambulatory Visit: Payer: Self-pay | Admitting: Cardiology

## 2022-07-11 ENCOUNTER — Ambulatory Visit: Payer: Medicare Other | Attending: Cardiology | Admitting: Cardiology

## 2022-07-11 ENCOUNTER — Encounter: Payer: Self-pay | Admitting: Cardiology

## 2022-07-11 ENCOUNTER — Other Ambulatory Visit: Payer: Self-pay

## 2022-07-11 VITALS — BP 134/68 | HR 78 | Ht 64.0 in | Wt 219.0 lb

## 2022-07-11 DIAGNOSIS — R55 Syncope and collapse: Secondary | ICD-10-CM | POA: Diagnosis not present

## 2022-07-11 DIAGNOSIS — D6869 Other thrombophilia: Secondary | ICD-10-CM

## 2022-07-11 DIAGNOSIS — R251 Tremor, unspecified: Secondary | ICD-10-CM

## 2022-07-11 DIAGNOSIS — I4891 Unspecified atrial fibrillation: Secondary | ICD-10-CM

## 2022-07-11 DIAGNOSIS — I495 Sick sinus syndrome: Secondary | ICD-10-CM | POA: Diagnosis not present

## 2022-07-11 MED ORDER — TOPIRAMATE 50 MG PO TABS: 50.0000 mg | ORAL_TABLET | Freq: Every day | ORAL | 1 refills | Status: DC

## 2022-07-11 MED ORDER — DILTIAZEM HCL ER COATED BEADS 300 MG PO CP24: 300.0000 mg | ORAL_CAPSULE | Freq: Every day | ORAL | 3 refills | Status: DC

## 2022-07-11 NOTE — Patient Instructions (Signed)
Medication Instructions:  Your physician has recommended you make the following change in your medication:  Increase Diltiazem to 300 mg tablets daily   Labwork: None  Testing/Procedures: None  Follow-Up: Follow up with Dr. Harl Bowie in 6 months.   Any Other Special Instructions Will Be Listed Below (If Applicable).     If you need a refill on your cardiac medications before your next appointment, please call your pharmacy.

## 2022-07-11 NOTE — Progress Notes (Signed)
Clinical Summary Laura [name] is a 61 y.o.female seen today for follow up of the following medical problems.      1.Chest pain  08/2019 cath LV end diastolic pressure is mildly elevated. There is no aortic valve stenosis. Aortic atherosclerosis/calcification noted. No angiographically apparently CAD.     -ER visit 03/18/22 with constant chest pain worst with position, from notes reproducible on exam - trops neg, EKG no specific ischemic changes, CT chest benign.  - sitting in living sharp left sided, 6/10 in severity. No other associated symptoms. Radiated in left shoulder and neck. Pain worst with movement. Pain would come and go. Able sleep. Woke recurrent symptoms, checked bp and it was elevated to 200. Tender to palpation.       2.Orthostatic syncope - prior issues with orthostatic syncope - at prior office visit so severely orthostatic could not get up from exam table, EMS called - have cut back her diuretic, bp meds   Depakote 12-25% dizziness, 1-5% orthostatic hypotension.  Gabapentin dizziness 17-28%  Imipramine: orthostatic hypotension Topamax: dizziness 14% Reglan: dizziness Jan 2023 HgbA1c 7.8, previously 8    - no change depakote at least 1 year, gabapentin was increased about 6 months, no changes in topamax,    - working to stay hydrated.  - no recent symptoms   2. Persistent afib/Tachy brady - admission with cardiogenic syncope, bradycardia - weaned av nodal agents. Issues with tachy and brady/pauses - had pacemaker placed 03/03/22 - given palpitations we restarted her bisoprolol at 2.5mg  on 03/24/22 - ongoing palpitations, not improved.  -no bleeding on eliquis.    - Dr Ladona Ridgel increased bisoprolol from 5mg  to 10mg  daily.  - palpitations daily, lasts few seconds.  - she reports some tremor with increased bisoprolol though tolerable.      3. HTN - careful management given issues with orthostatic hypotension in the past  - she is compliant with meds    4 LE edema/Chronic diastolic HF - 08/2019 echo LVEF 60-65%, elevated LVEDP, mild RV dysfunction, mild to mod LAE,  11/2020 echo LVEF 65-70%, indet diastolic, low normal RV function - we changed torsemide to just prn given prior orthostatic symptoms, has not needed in some time.     - has not needed prn torsemide.      5. Prior ASD repair   6. Asthma/Bronchitis - recent admission 04/2021 - followed by Dr 12/2020   7. COVID + in Jan 2022 - recurrent infection 05/2021  8. Preop - planning for foot surgerty, cleared from cardiac standpoint. Past Medical History:  Diagnosis Date   Asthma    Atrial fibrillation (HCC)    Bipolar disorder (HCC)    Bronchiectasis (HCC)    Carpal tunnel syndrome of right wrist    CHF (congestive heart failure) (HCC)    Complication of anesthesia    "my Dr in Feb 2022 told me I had an allergic reaction to the combination of the pain meds and the anesthesia" she does not remember what happened but "I eneded up in the ICU for 5 days".   H/O congenital atrial septal defect (ASD) repair    Hypogammaglobulinemia (HCC)    Hypothyroid    IgE deficiency (HCC)    Neuropathy    Neuropathy    Osteoarthritis    Pinched nerve in neck    PTSD (post-traumatic stress disorder)    Recurrent sinusitis    Short-term memory loss    Sleep apnea    TIA (transient ischemic attack)  Tremors of nervous system    seeing PCP for this.   Type 2 diabetes mellitus (HCC)    Urticaria    Wound infection      Allergies  Allergen Reactions   Ativan [Lorazepam] Hives   Phenergan [Promethazine Hcl] Hives     Current Outpatient Medications  Medication Sig Dispense Refill   acetaminophen (TYLENOL) 325 MG tablet Take 2 tablets (650 mg total) by mouth every 6 (six) hours as needed for mild pain, fever or headache. 30 tablet 1   albuterol (PROVENTIL) (2.5 MG/3ML) 0.083% nebulizer solution Take 3 mLs (2.5 mg total) by nebulization every 4 (four) hours as needed for wheezing or  shortness of breath. 75 mL 1   albuterol (VENTOLIN HFA) 108 (90 Base) MCG/ACT inhaler INHALE 2 PUFFS INTO THE LUNGS EVERY 6 HOURS AS NEEDED FOR WHEEZING OR SHORTNESS OF BREATH 18 g 1   apixaban (ELIQUIS) 5 MG TABS tablet TAKE ONE TABLET BY MOUTH TWICE DAILY $RemoveBef'@9AM'GeFJUnZjPM$  & 5PM 180 tablet 1   atorvastatin (LIPITOR) 10 MG tablet Take 10 mg by mouth at bedtime.     BD PEN NEEDLE NANO 2ND GEN 32G X 4 MM MISC 1 each by Other route in the morning, at noon, in the evening, and at bedtime. 90 each 0   Benralizumab (FASENRA PEN) 30 MG/ML SOAJ Inject 1 mL (30 mg total) into the skin every 28 (twenty-eight) days. Then every 8 weeks 1 mL 8   bisoprolol (ZEBETA) 10 MG tablet Take 1 tablet (10 mg total) by mouth daily. 90 tablet 3   blood glucose meter kit and supplies 1 each by Other route 4 (four) times daily. Dispense based on patient and insurance preference. Use up to four times daily as directed. (FOR ICD-10 E10.9, E11.9). 1 each 0   Cholecalciferol (VITAMIN D3) 125 MCG (5000 UT) CAPS Take 1 capsule (5,000 Units total) by mouth daily. 90 capsule 0   Continuous Blood Gluc Sensor (FREESTYLE LIBRE 3 SENSOR) MISC 1 Piece by Does not apply route every 14 (fourteen) days. Place 1 sensor on the skin every 14 days. Use to check glucose continuously 6 each 2   CUVITRU 10 GM/50ML SOLN Inject 25 mg into the skin every 14 (fourteen) days.     diltiazem (CARDIZEM CD) 240 MG 24 hr capsule Take 1 capsule (240 mg total) by mouth daily. 90 capsule 3   [START ON 07/27/2022] divalproex (DEPAKOTE ER) 500 MG 24 hr tablet Take 1 tablet (500 mg total) by mouth daily AND 2 tablets (1,000 mg total) at bedtime. 270 tablet 0   ELDERBERRY PO Take 4 g by mouth daily. Gummies 2g each     EPINEPHrine 0.3 mg/0.3 mL IJ SOAJ injection Inject 0.3 mg into the muscle once as needed for anaphylaxis.     ferrous sulfate 324 MG TBEC Take 324 mg by mouth every evening.     fluticasone (FLOVENT HFA) 110 MCG/ACT inhaler Inhale 2 puffs into the lungs 2 (two)  times daily. 1 each 5   fluticasone furoate-vilanterol (BREO ELLIPTA) 100-25 MCG/ACT AEPB INHALE 1 PUFF INTO LUNGS DAILY (BULK) 60 each 5   gabapentin (NEURONTIN) 600 MG tablet Take 600 mg by mouth 3 (three) times daily.     Immune Globulin, Human, (CUVITRU [employer]) Inject 25 mg into the skin every 14 (fourteen) days.     Insulin Pen Needle (PEN NEEDLES 3/16") 31G X 5 MM MISC Use as directed with insulin pen 100 each 0   LANTUS SOLOSTAR 100 UNIT/ML  Solostar Pen INJECT 70 UNITS SUBCUTANEOUSLY AT BEDTIME (BULK) 30 mL 2   levalbuterol (XOPENEX HFA) 45 MCG/ACT inhaler Inhale 2 puffs into the lungs every 6 (six) hours as needed.     levalbuterol (XOPENEX) 1.25 MG/3ML nebulizer solution Inhale 1.25 mg into the lungs daily as needed (asthma).     levothyroxine (SYNTHROID, LEVOTHROID) 112 MCG tablet Take 112 mcg by mouth daily before breakfast.     lidocaine-prilocaine (EMLA) cream Apply 1 application  topically as needed [facility]).     linaclotide (LINZESS) 145 MCG CAPS capsule Take 1 capsule (145 mcg total) by mouth daily before breakfast. Office visit needed before further refills 90 capsule 1   losartan (COZAAR) 25 MG tablet Take 1 tablet (25 mg total) by mouth daily. 90 tablet 1   MELATONIN GUMMIES PO Take 10 mg by mouth at bedtime.     metFORMIN (GLUCOPHAGE) 500 MG tablet Take 500 mg by mouth 2 (two) times daily.     metoCLOPramide (REGLAN) 5 MG tablet Take 5 mg by mouth 3 (three) times daily.     montelukast (SINGULAIR) 10 MG tablet Take 10 mg by mouth at bedtime.     Multiple Vitamins-Minerals (MULTIVITAMIN WITH MINERALS) tablet Take 1 tablet by mouth daily. Woman     Nebulizer MISC Nebulizer tubing kit 2 each 5   olmesartan (BENICAR) 40 MG tablet Take 40 mg by mouth daily.     omeprazole (PRILOSEC) 20 MG capsule TAKE 1 CAPSULE(20 MG) BY MOUTH TWICE DAILY BEFORE A MEAL 30 capsule 5   ondansetron (ZOFRAN-ODT) 8 MG disintegrating tablet Take 8 mg by mouth daily as needed for vomiting or nausea.      ONETOUCH ULTRA test strip 1 each by Other route in the morning, at noon, in the evening, and at bedtime.     oxyCODONE-acetaminophen (PERCOCET) 5-325 MG tablet Take 1 tablet by mouth every 6 (six) hours as needed. 10 tablet 0   polyethylene glycol (MIRALAX / GLYCOLAX) 17 g packet Take 17 g by mouth daily as needed. (Patient taking differently: Take 17 g by mouth daily as needed for moderate constipation or severe constipation.) 14 each 0   Potassium Chloride ER 20 MEQ TBCR Take 20 mEq by mouth daily.     Spacer/Aero-Holding Chambers (AEROCHAMBER PLUS) inhaler 1 each by Other route as needed for other. Use as instructed 1 each 0   topiramate (TOPAMAX) 100 MG tablet Take 1 tablet (100 mg total) by mouth 2 (two) times daily. 180 tablet 0   torsemide (DEMADEX) 20 MG tablet Take 1 tablet daily for fluid gain of 3 lb overnight or 5 lbs in a week. (Patient taking differently: as needed. Take 1 tablet daily for fluid gain of 3 lb overnight or 5 lbs in a week.) 90 tablet 3   triamcinolone cream (KENALOG) 0.1 % Apply 1 application. topically daily as needed (foot).     TRULICITY 3 IW/9.7LG SOPN INJECT $RemoveBef'3MG'TfkORbHcGB$  AS DIRECTED ONCE A WEEK (BULK) 6 mL 0   vitamin B-12 (CYANOCOBALAMIN) 1000 MCG tablet Take 1,000 mcg by mouth daily.     vitamin C (ASCORBIC ACID) 500 MG tablet Take 500 mg by mouth daily. Power C immune support     Current Facility-Administered Medications  Medication Dose Route Frequency Provider Last Rate Last Admin   Benralizumab SOSY 30 mg  30 mg Subcutaneous Q28 days Dara Hoyer, FNP   30 mg at 12/27/21 1603     Past Surgical History:  Procedure Laterality Date  ASD REPAIR  1968   BIOPSY  01/26/2021   Procedure: BIOPSY;  Surgeon: [name], DO;  Location: [facility];  Service: Endoscopy;;  gastric   CARDIAC SURGERY     CARDIOVERSION     CARDIOVERSION N/A 08/29/2018   Procedure: CARDIOVERSION;  Surgeon: Arnoldo Lenis, MD;  Location: [facility];  Service: Endoscopy;   Laterality: N/A;   [place]   COLONOSCOPY WITH PROPOFOL N/A 01/26/2021   Procedure: COLONOSCOPY WITH PROPOFOL;  Surgeon: [name], DO;  Location: [facility];  Service: Endoscopy;  Laterality: N/A;  am appt, diabetic   ESOPHAGOGASTRODUODENOSCOPY (EGD) WITH PROPOFOL N/A 01/26/2021   Procedure: ESOPHAGOGASTRODUODENOSCOPY (EGD) WITH PROPOFOL;  Surgeon: [name], DO;  Location: [facility];  Service: Endoscopy;  Laterality: N/A;   LEFT HEART CATH AND CORONARY ANGIOGRAPHY N/A 08/30/2019   Procedure: LEFT HEART CATH AND CORONARY ANGIOGRAPHY;  Surgeon: [name], MD;  Location: [place] [facility];  Service: Cardiovascular;  Laterality: N/A;   NASAL SINUS SURGERY  2017   PACEMAKER IMPLANT N/A 03/03/2022   Procedure: PACEMAKER IMPLANT;  Surgeon: [name], MD;  Location: [place] [facility];  Service: Cardiovascular;  Laterality: N/A;   POLYPECTOMY  01/26/2021   Procedure: POLYPECTOMY;  Surgeon: [name], DO;  Location: [facility];  Service: Endoscopy;;  colon     Allergies  Allergen Reactions   Ativan [Lorazepam] Hives   Phenergan [Promethazine Hcl] Hives      Family History  Problem Relation Age of Onset   Hypertension Mother    Stroke Mother    Kidney disease Mother    Heart attack Mother    Alcohol abuse Mother    Other Father        ?colon cancer   Kidney disease Sister    Hypertension Sister    Bipolar disorder Sister    Atrial fibrillation Sister    Heart disease Brother    Prostate cancer Brother    Thyroid disease Daughter    Hashimoto's thyroiditis Daughter    Celiac disease Daughter    Allergic rhinitis Daughter    Stroke Maternal Aunt    Heart disease Maternal Aunt    Asthma Neg Hx      Social History Ms. Stevick reports that she quit smoking about 15 years ago. Her smoking use included cigarettes. She started smoking about 43 years ago. She has a 28.00 pack-year smoking history. She has never  used smokeless tobacco. Ms. Granier reports that she does not currently use alcohol.   Review of Systems CONSTITUTIONAL: No weight loss, fever, chills, weakness or fatigue.  HEENT: Eyes: No visual loss, blurred vision, double vision or yellow sclerae.No hearing loss, sneezing, congestion, runny nose or sore throat.  SKIN: No rash or itching.  CARDIOVASCULAR: per hpi RESPIRATORY: No shortness of breath, cough or sputum.  GASTROINTESTINAL: No anorexia, nausea, vomiting or diarrhea. No abdominal pain or blood.  GENITOURINARY: No burning on urination, no polyuria NEUROLOGICAL: No headache, dizziness, syncope, paralysis, ataxia, numbness or tingling in the extremities. No change in bowel or bladder control.  MUSCULOSKELETAL: No muscle, back pain, joint pain or stiffness.  LYMPHATICS: No enlarged nodes. No history of splenectomy.  PSYCHIATRIC: No history of depression or anxiety.  ENDOCRINOLOGIC: No reports of sweating, cold or heat intolerance. No polyuria or polydipsia.  Marland Kitchen   Physical Examination Today's Vitals   07/11/22 0834  BP: 134/68  Pulse: 78  SpO2: 98%  Weight: 219 lb (99.3 kg)  Height: $Remove'5\' 4"'BvJPpXY$  (1.626 m)   Body mass index is 37.59 kg/m.  Gen: resting comfortably, no acute distress HEENT: no scleral icterus, pupils equal round and reactive, no palptable cervical adenopathy,  CV: irreg, no m/r/ gno jvd Resp: Clear to auscultation bilaterally GI: abdomen is soft, non-tender, non-distended, normal bowel sounds, no hepatosplenomegaly MSK: extremities are warm, no edema.  Skin: warm, no rash Neuro:  no focal deficits Psych: appropriate affect   Diagnostic Studies 08/2019 cath LV end diastolic pressure is mildly elevated. There is no aortic valve stenosis. Aortic atherosclerosis/calcification noted. No angiographically apparently CAD.     08/2019 echo 1. Left ventricular ejection fraction, by visual estimation, is 60 to  65%. The left ventricle has normal function.  There is mildly increased  left ventricular hypertrophy.   2. Elevated left ventricular end-diastolic pressure.   3. Left ventricular diastolic function could not be evaluated.   4. Global right ventricle has mildly reduced systolic function.The right  ventricular size is normal. No increase in right ventricular wall  thickness.   5. Left atrial size was mild-moderately dilated.   6. Right atrial size was normal.   7. Moderate to severe mitral annular calcification.   8. The mitral valve is degenerative. Trace mitral valve regurgitation.   9. The tricuspid valve is grossly normal. Tricuspid valve regurgitation  is trivial.  10. The aortic valve is tricuspid. Aortic valve regurgitation is not  visualized. Mild aortic valve sclerosis without stenosis.  11. The pulmonic valve was grossly normal. Pulmonic valve regurgitation is  not visualized.  12. The inferior vena cava is normal in size with greater than 50%  respiratory variability, suggesting right atrial pressure of 3 mmHg.    Assessment and Plan     1. Tachy brady/afib now with pacemaker/acquired thrombophilia - some ongonig palpitations, increase diltiazem to $RemoveBefo'300mg'TsbgKuWBCPL$  daily. Room to titrate furhter if needed.    3. Chronic diastolic HF -not requiring prn torsemide at all - no symptms, continue to moniotor  4. Orthostatic syncope - no recent symptoms, continue to monitor    5. HTN - mildly above goal, we are increasing ditlaizem as reported above   F/u 6 months      Arnoldo Lenis, M.D

## 2022-07-11 NOTE — Telephone Encounter (Signed)
Called patient and she has agreed to the change in topiramate and spoke to her cardio doc today about adjusting her medications. 50 mg dose sent to [facility] at patients request

## 2022-07-12 ENCOUNTER — Encounter: Payer: Self-pay | Admitting: Psychiatry

## 2022-07-12 DIAGNOSIS — D839 Common variable immunodeficiency, unspecified: Secondary | ICD-10-CM | POA: Diagnosis not present

## 2022-07-12 LAB — CBC
Hematocrit: 41.9 % (ref 34.0–46.6)
Hemoglobin: 14.2 g/dL (ref 11.1–15.9)
MCH: 32.4 pg (ref 26.6–33.0)
MCHC: 33.9 g/dL (ref 31.5–35.7)
MCV: 96 fL (ref 79–97)
Platelets: 200 10*3/uL (ref 150–450)
RBC: 4.38 x10E6/uL (ref 3.77–5.28)
RDW: 13.6 % (ref 11.7–15.4)
WBC: 5 10*3/uL (ref 3.4–10.8)

## 2022-07-12 LAB — COMPREHENSIVE METABOLIC PANEL
ALT: 10 IU/L (ref 0–32)
AST: 11 IU/L (ref 0–40)
Albumin/Globulin Ratio: 1.7 (ref 1.2–2.2)
Albumin: 4.3 g/dL (ref 3.8–4.9)
Alkaline Phosphatase: 89 IU/L (ref 44–121)
BUN/Creatinine Ratio: 23 (ref 12–28)
BUN: 15 mg/dL (ref 8–27)
Bilirubin Total: 0.2 mg/dL (ref 0.0–1.2)
CO2: 18 mmol/L — ABNORMAL LOW (ref 20–29)
Calcium: 8.8 mg/dL (ref 8.7–10.3)
Chloride: 105 mmol/L (ref 96–106)
Creatinine, Ser: 0.66 mg/dL (ref 0.57–1.00)
Globulin, Total: 2.5 g/dL (ref 1.5–4.5)
Glucose: 140 mg/dL — ABNORMAL HIGH (ref 70–99)
Potassium: 4.6 mmol/L (ref 3.5–5.2)
Sodium: 139 mmol/L (ref 134–144)
Total Protein: 6.8 g/dL (ref 6.0–8.5)
eGFR: 100 mL/min/{1.73_m2} (ref >59)

## 2022-07-12 LAB — VALPROIC ACID LEVEL: Valproic Acid Lvl: 61 ug/mL (ref 50–100)

## 2022-07-13 ENCOUNTER — Encounter: Payer: Self-pay | Admitting: Allergy & Immunology

## 2022-07-13 ENCOUNTER — Ambulatory Visit (INDEPENDENT_AMBULATORY_CARE_PROVIDER_SITE_OTHER): Payer: Medicare Other | Admitting: Allergy & Immunology

## 2022-07-13 ENCOUNTER — Other Ambulatory Visit: Payer: Self-pay

## 2022-07-13 VITALS — BP 122/70 | HR 74 | Temp 98.2°F | Resp 18

## 2022-07-13 DIAGNOSIS — G4733 Obstructive sleep apnea (adult) (pediatric): Secondary | ICD-10-CM | POA: Diagnosis not present

## 2022-07-13 DIAGNOSIS — R918 Other nonspecific abnormal finding of lung field: Secondary | ICD-10-CM

## 2022-07-13 DIAGNOSIS — J310 Chronic rhinitis: Secondary | ICD-10-CM

## 2022-07-13 DIAGNOSIS — D839 Common variable immunodeficiency, unspecified: Secondary | ICD-10-CM | POA: Diagnosis not present

## 2022-07-13 DIAGNOSIS — J4550 Severe persistent asthma, uncomplicated: Secondary | ICD-10-CM

## 2022-07-13 DIAGNOSIS — R251 Tremor, unspecified: Secondary | ICD-10-CM

## 2022-07-13 DIAGNOSIS — K219 Gastro-esophageal reflux disease without esophagitis: Secondary | ICD-10-CM

## 2022-07-13 MED ORDER — TOPIRAMATE 100 MG PO TABS: 100.0000 mg | ORAL_TABLET | Freq: Two times a day (BID) | ORAL | 0 refills | Status: DC

## 2022-07-13 NOTE — Progress Notes (Signed)
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 Merlene Laughter, but wants to consolidate care at [facility]   Tremors - thought to be medication induced (followed by [facility])   Headaches (followed by [facility])   Recent fall - with clear head CT   Non-allergic rhinitis   Paroxysmal atrial fibrillation - followed by Cardiology   Enlarged lymph nodes on chest CT - with no mention of them on a recent chest CT in June 2023 (but they are mentioned in an April chest CT)    Plan/Recommendations:   1. Hypogammaglobulinemia - Continue with Cuvitru.  - We will continue with 27 g every 2 weeks. - IgG level was excellent and the rest of your labs looked good as well (aside from the hemoglobin A1c which is IMPROVING).  - I think we are in a good spot.   2. Chronic rhinitis   - Continue with nasal saline rinses as needed.  - You seem to have a good sense of your symptoms.  3. Severe persistent asthma, uncomplicated - Lung testing looks decent today, although it was a bit lower than usual.  - We are going to refill your current medications.  - I am fine with your stopping Flovent.  - Daily medication: Breo 14mg one puff once daily + Fasenra every 8 weeks - Prior to physical activity: Xopenex 2 puffs 10-15 minutes before physical activity. - Rescue medications:  Xopenex nebulizer every 4-6 hours as needed, Xopenex 4 puffs every 4-6 hours as needed, or DuoNeb nebulizer one vial every 4-6 hours as needed - Asthma control goals:  * Full participation in all desired activities (may need albuterol before activity) * Albuterol use two time or less a week on average (not counting use with activity) * Cough interfering with sleep two time or less a month * Oral steroids no more than once a  year * No hospitalizations.  4. Return in about 3 months (around 10/13/2022).       Subjective:   Laura Vallelyis a 61y.o. female presenting today for follow up of  Chief Complaint  Patient presents with   Asthma    Asthma has not been doing good she states the weather has been messing her up. Has been having cough,sneezing, runny nose, and SOB.     Laura Grollhas a history of the following: Patient Active Problem List   Diagnosis Date Noted   Chest pain 02/08/2022   Syncope 02/07/2022   Class 2 severe obesity due to excess calories with serious comorbidity and body mass index (BMI) of 35.0 to 35.9 in adult ([facility] 01/14/2022   Acute cough 12/31/2021   Common variable immunodeficiency ([place] 12/31/2021   Vitamin B12 deficiency 05/21/2021   Vitamin D deficiency 05/21/2021   Abdominal pain 04/27/2021   Urinary incontinence 04/27/2021   Dysphagia 04/27/2021   Constipation 04/27/2021   Mixed 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 ([place] 04/10/2021   Gastroesophageal reflux disease 04/10/2021   Adnexal cyst 01/19/2021   Chronic RLQ pain 01/19/2021   Mixed stress and urge urinary incontinence 01/19/2021   Other fatigue 12/10/2020   Abdominal pain, chronic, right lower quadrant 12/08/2020   Abnormal weight loss 12/08/2020   Poor appetite 12/08/2020   Severe persistent asthma with acute exacerbation 10/01/2019   Abnormal EKG  Chronic diastolic (congestive) heart failure (HCC) 08/29/2019   Shortness of breath 08/29/2019   Musculoskeletal chest pain 08/28/2019   Headache 08/10/2018   H/O atrial flutter 08/10/2018   OSA on CPAP 08/10/2018   Anxiety 08/10/2018   Uncontrolled type 2 diabetes mellitus with hyperglycemia ([place]) 03/13/2018   Hypothyroidism 03/13/2018   Essential hypertension, benign 03/13/2018   Mixed hyperlipidemia 03/13/2018   Hypogammaglobulinemia ([place]) 02/06/2018   Non-allergic rhinitis 02/06/2018   Severe persistent asthma,  uncomplicated 44/12/4740   Pulmonary nodules 02/06/2018   Bronchiectasis without complication ([place]) 59/56/3875   Type 2 diabetes mellitus with hyperglycemia, with long-term current use of insulin ([place]) 02/06/2018    History obtained from: chart review and patient.  Laura [name] is a 61 y.o. female presenting for a follow up visit.  She was last seen in August 2023.  This was via televisit and the visit was monopolize by her desire to get a referral to neurology for neuropathy.  She wanted to follow-up with Dr. Carles Collet for that.  We attempted to arrange all of her referrals so that they were appropriately routed.  Since the last visit, she has actually done fairly well. She did go to [place]. She had a great time there.   Asthma/Respiratory Symptom History: Breathing is well controlled. She feels that her breathing is going back and forth with the weather. She is having trouble with the weather changes between cold and hot.  She needs her Xopenex inhaler for rescue. She has talked to her pharmacy about this and they are sending a new one.  She was on Flovent at one point in addition to the [place]. She was told to not do the Flovent with the [facility]. She is on her Berna Bue. She recently had her Berna Bue this morning.  She has been breathing very well since last time we saw her.  Allergic Rhinitis Symptom History: She has not needed antibiotics since we saw her.  She has nasal saline rinses to use as needed.  Infusions are going well.  She is on the increased dose of 27 g every 2 weeks.  She was previously on 25 g every 2 weeks.  However, since she was developing frequent infections, we increased the dose and she seems to have responded very well. She just did her increased dose of Cuvitru. She denies any reactions. She does think that she feels some more energy. It does take a tad longer, maybe ten minutes longer. These are going fine. She does them every two weeks. It takes around around 2-3 hours total for her  infusions.  Her last course of antibiotics was what we gave her when she was in [place].   She had her pacemaker check up with Dr. Lovena Le. Apparently he told her to lose 20 pounds before the next visit.  She does not really like the way that he spoke to her.  However, she has about a year to lose the weight.  She is cleared for her surgery later this month. This is an outpatient procedure. It is scheduled for October 20th.  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   Otherwise, there have been no changes to her past medical history, surgical history, family history, or social history.    Review of Systems  Constitutional: Negative.  Negative for chills, fever, malaise/fatigue and weight loss.  HENT:  Positive for congestion. Negative for ear discharge, ear pain and sinus pain.   Eyes:  Negative for pain, discharge and redness.  Respiratory:  Positive  for cough. Negative for sputum production, shortness of breath, wheezing and stridor.   Cardiovascular: Negative.  Negative for chest pain and palpitations.  Gastrointestinal:  Negative for abdominal pain, constipation, diarrhea, heartburn, nausea and vomiting.  Skin: Negative.  Negative for itching and rash.  Neurological:  Negative for dizziness and headaches.  Endo/Heme/Allergies:  Negative for environmental allergies. Does not bruise/bleed easily.       Objective:   Blood pressure 122/70, pulse 74, temperature 98.2 F (36.8 C), resp. rate 18, SpO2 97 %. There is no height or weight on file to calculate BMI.    Physical Exam Vitals reviewed.  Constitutional:      Appearance: She is well-developed and overweight.     Comments: Very talkative.   HENT:     Head: Normocephalic and atraumatic.     Right Ear: Tympanic membrane, ear canal and external ear normal.     Left Ear: Tympanic membrane, ear canal and external ear normal.     Nose: No nasal deformity, septal deviation, mucosal edema or rhinorrhea.     Right Turbinates:  Enlarged, swollen and pale.     Left Turbinates: Enlarged, swollen and pale.     Right Sinus: No maxillary sinus tenderness or frontal sinus tenderness.     Left Sinus: No maxillary sinus tenderness or frontal sinus tenderness.     Mouth/Throat:     Lips: Pink.     Mouth: Mucous membranes are moist. Mucous membranes are not pale and not dry.     Pharynx: Uvula midline.  Eyes:     General: Lids are normal. Allergic shiner present.        Right eye: No discharge.        Left eye: No discharge.     Conjunctiva/sclera: Conjunctivae normal.     Right eye: Right conjunctiva is not injected. No chemosis.    Left eye: Left conjunctiva is not injected. No chemosis.    Pupils: Pupils are equal, round, and reactive to light.  Cardiovascular:     Rate and Rhythm: Normal rate and regular rhythm.     Heart sounds: Normal heart sounds.  Pulmonary:     Effort: Pulmonary effort is normal. No tachypnea, accessory muscle usage or respiratory distress.     Breath sounds: Normal breath sounds. No wheezing, rhonchi or rales.     Comments: Moving air well in all lung fields. No increased work of breathing noted.  Chest:     Chest wall: No tenderness.  Lymphadenopathy:     Cervical: No cervical adenopathy.  Skin:    Coloration: Skin is not pale.     Findings: No abrasion, erythema, petechiae or rash. Rash is not papular, urticarial or vesicular.  Neurological:     Mental Status: She is alert.     Comments: Hand tremors are much better than before.   Psychiatric:        Behavior: Behavior is cooperative.      Diagnostic studies:    Spirometry: results abnormal (FEV1: 1.57/65%, FVC: 2.72/89%, FEV1/FVC: 58%).    Spirometry consistent with moderate obstructive disease. Her FEV1 is lower than that obtained at the last visit.     Allergy Studies: none        Salvatore Marvel, MD  [facility] [place] [facility] [place]

## 2022-07-13 NOTE — Patient Instructions (Addendum)
1. Hypogammaglobulinemia - Continue with Cuvitru.  - We will continue with 27 g every 2 weeks. - IgG level was excellent and the rest of your labs looked good as well (aside from the hemoglobin A1c which is IMPROVING).  - I think we are in a good spot.   2. Chronic rhinitis   - Continue with nasal saline rinses as needed.  - You seem to have a good sense of your symptoms.  3. Severe persistent asthma, uncomplicated - Lung testing looks decent today, although it was a bit lower than usual.  - We are going to refill your current medications.  - I am fine with your stopping Flovent.  - Daily medication: Breo 194mg one puff once daily + Fasenra every 8 weeks - Prior to physical activity: Xopenex 2 puffs 10-15 minutes before physical activity. - Rescue medications:  Xopenex nebulizer every 4-6 hours as needed, Xopenex 4 puffs every 4-6 hours as needed, or DuoNeb nebulizer one vial every 4-6 hours as needed - Asthma control goals:  * Full participation in all desired activities (may need albuterol before activity) * Albuterol use two time or less a week on average (not counting use with activity) * Cough interfering with sleep two time or less a month * Oral steroids no more than once a year * No hospitalizations.  4. Return in about 3 months (around 10/13/2022).    Please inform [place] any Emergency Department visits, hospitalizations, or changes in symptoms. Call [place]efore going to the ED for breathing or allergy symptoms since we might be able to fit you in for a sick visit. Feel free to contact u[place]anytime with any questions, problems, or concerns.  It was a pleasure to see you again today!  Websites that have reliable patient information: 1. American Academy of Asthma, Allergy, and Immunology: www.aaaai.org 2. Food Allergy Research and Education (FARE): foodallergy.org 3. Mothers of Asthmatics: http://www.asthmacommunitynetwork.org 4. American College of Allergy, Asthma, and  Immunology: www.acaai.org   COVID-19 Vaccine Information can be found at: [email or web address] questions related to vaccine distribution or appointments, please email [email or web address] or call [phone]     "Like" [facility] Facebook and Instagram for our latest updates!       Make sure you are registered to vote! If you have moved or changed any of your contact information, you will need to get this updated before voting!  In some cases, you MAY be able to register to vote online: [email or web address]

## 2022-07-15 ENCOUNTER — Telehealth: Payer: Self-pay | Admitting: Neurology

## 2022-07-15 ENCOUNTER — Telehealth: Payer: Self-pay | Admitting: Cardiology

## 2022-07-15 MED ORDER — LOSARTAN POTASSIUM 25 MG PO TABS: 25.0000 mg | ORAL_TABLET | Freq: Every day | ORAL | 3 refills | Status: DC

## 2022-07-15 NOTE — Telephone Encounter (Signed)
Spoke to [place] with [facility]- who stated that the 8 day prescription was sent to get patient through until 10/10. Patient notified and verbalized understanding.

## 2022-07-15 NOTE — Telephone Encounter (Signed)
*  STAT* If patient is at the pharmacy, call can be transferred to refill team.   1. Which medications need to be refilled? (please list name of each medication and dose if known)  diltiazem (CARDIZEM CD) 300 MG 24 hr capsule  losartan (COZAAR) 50 MG tablet  2. Which pharmacy/location (including street and city if local pharmacy) is medication to be sent to?  [facility] - [place] - [address]  3. Do they need a 30 day or 90 day supply?  90 day supply  Patient states her insurance is only covering 8 tablets of diltiazem 300 MG unless an additional Rx is sent in with new dose. I advised her new dose was sent to [facility] on 10/[facility]. Patient states she was already aware of this, but the insurance is still requesting a  new prescription--there seems to be some confusion.  Patient would also like to clarify whether she should be taking 25 MG or 50 MG before new prescription is sent in--she is requesting 50 MG if possible.

## 2022-07-15 NOTE — Telephone Encounter (Signed)
Patient called and said she is having a problem with her insurance covering her topiramate.  She said an over-ride may need to be done for the recent increase.  She is almost out of the medication but has enough for the weekend.

## 2022-07-18 DIAGNOSIS — E039 Hypothyroidism, unspecified: Secondary | ICD-10-CM | POA: Diagnosis not present

## 2022-07-18 DIAGNOSIS — E1165 Type 2 diabetes mellitus with hyperglycemia: Secondary | ICD-10-CM | POA: Diagnosis not present

## 2022-07-18 DIAGNOSIS — I10 Essential (primary) hypertension: Secondary | ICD-10-CM | POA: Diagnosis not present

## 2022-07-19 ENCOUNTER — Other Ambulatory Visit (HOSPITAL_COMMUNITY): Payer: Self-pay

## 2022-07-19 LAB — HEPATIC FUNCTION PANEL
ALT: 14 U/L (ref 7–35)
AST: 13 (ref 13–35)
Alkaline Phosphatase: 93 (ref 25–125)
Bilirubin, Total: 0.4

## 2022-07-19 LAB — BASIC METABOLIC PANEL
BUN: 15 (ref 4–21)
CO2: 19 (ref 13–22)
Chloride: 106 (ref 99–108)
Creatinine: 0.8 (ref 0.5–1.1)
Glucose: 119
Potassium: 4.6 mEq/L (ref 3.5–5.1)
Sodium: 141 (ref 137–147)

## 2022-07-19 LAB — COMPREHENSIVE METABOLIC PANEL
Albumin: 4.5 (ref 3.5–5.0)
Calcium: 9.7 (ref 8.7–10.7)
Globulin: 2.9

## 2022-07-19 LAB — LIPID PANEL
Cholesterol: 149 (ref 0–200)
HDL: 45 (ref 35–70)
LDL Cholesterol: 83
Triglycerides: 113 (ref 40–160)

## 2022-07-19 LAB — TSH: TSH: 1.16 (ref 0.41–5.90)

## 2022-07-19 LAB — HEMOGLOBIN A1C: Hemoglobin A1C: 7.7

## 2022-07-19 NOTE — Telephone Encounter (Signed)
I ran a test claim for topiramate '50mg'$  and '100mg'$  and they show they are too soon to fill. Previous fill dates show 05-30-2022, 07-05-2022, and 07-08-2022. Date of next fill is 08-06-2022

## 2022-07-20 ENCOUNTER — Other Ambulatory Visit: Payer: Self-pay

## 2022-07-20 NOTE — Telephone Encounter (Signed)
Called patient and pharmacy and patient has her '50mg'$  topiramate coming in the mail

## 2022-07-21 ENCOUNTER — Encounter
Admission: RE | Admit: 2022-07-21 | Discharge: 2022-07-21 | Disposition: A | Discharge status: Home / Self Care | Payer: Medicare Other | Source: Ambulatory Visit | Attending: Podiatry | Admitting: Podiatry

## 2022-07-21 ENCOUNTER — Encounter: Payer: Self-pay | Admitting: Internal Medicine

## 2022-07-21 ENCOUNTER — Telehealth: Payer: Self-pay | Admitting: *Deleted

## 2022-07-21 DIAGNOSIS — Z01818 Encounter for other preprocedural examination: Secondary | ICD-10-CM

## 2022-07-21 HISTORY — DX: Obesity, unspecified: E66.9

## 2022-07-21 HISTORY — DX: Presence of cardiac pacemaker: Z95.0

## 2022-07-21 HISTORY — DX: Anemia, unspecified: D64.9

## 2022-07-21 HISTORY — DX: Obstructive sleep apnea (adult) (pediatric): G47.33

## 2022-07-21 HISTORY — DX: Headache, unspecified: R51.9

## 2022-07-21 HISTORY — DX: Syncope and collapse: R55

## 2022-07-21 HISTORY — DX: Gastro-esophageal reflux disease without esophagitis: K21.9

## 2022-07-21 HISTORY — DX: Other nonspecific abnormal finding of lung field: R91.8

## 2022-07-21 NOTE — Telephone Encounter (Signed)
-----   Message from Karen Kitchens, NP sent at 07/21/2022 11:15 AM EDT ----- Regarding: Request for pre-operative cardiac clearance Request for pre-operative cardiac clearance:  1. What type of surgery is being performed?  RIGHT HALLUX VALGUS AUSTIN WITH AIKEN; METATARSAL OSTEOTOMY; HAMMER TOE CORRECTION 2ND  2. When is this surgery scheduled?  07/29/2022  3. Type of clearance being requested (medical, pharmacy, both)? BOTH   4. Are there any medications that need to be held prior to surgery? APIXABAN  5. Practice name and name of physician performing surgery?  Performing surgeon: Dr. Vanessa Kick, DPM Requesting clearance: Honor Loh, FNP-C    6. Anesthesia type (none, local, MAC, general)? GENERAL  7. What is the office phone and fax number?   Phone: [phone]  ATTENTION: Unable to create telephone message as per your standard workflow. Directed by [facility] providers to send requests for cardiac clearance to this pool for appropriate distribution to provider covering pre-operative clearances.   Honor Loh, MSN, APRN, FNP-C, CEN [facility]  Peri-operative Services Nurse Practitioner Phone: [phone] 07/21/22 11:15 AM

## 2022-07-21 NOTE — Patient Instructions (Signed)
Your procedure is scheduled on:07-29-22 Friday Report to the Registration Desk on the 1st floor of the [facility].Then proceed to the 2nd floor Surgery Desk To find out your arrival time, please call [phone] between 1PM - 3PM on:07-28-22 Thursday If your arrival time is 6:00 am, do not arrive prior to that time as the [facility] entrance doors do not open until 6:00 am.  REMEMBER: Instructions that are not followed completely may result in serious medical risk, up to and including death; or upon the discretion of your surgeon and anesthesiologist your surgery may need to be rescheduled.  Do not eat food OR drink any liquids after midnight the night before surgery.  No gum chewing, lozengers or hard candies.  TAKE THESE MEDICATIONS THE MORNING OF SURGERY WITH A SIP OF WATER: -bisoprolol (ZEBETA)  -diltiazem (CARDIZEM CD) -divalproex (DEPAKOTE ER) -gabapentin (NEURONTIN)  -levothyroxine (SYNTHROID) -metoCLOPramide (REGLAN) -omeprazole (PRILOSEC) -topiramate (TOPAMAX)   Stop your Trulicity 7 days prior to surgery-Last dose was on 07-17-22  Stop your apixaban (ELIQUIS) 3 days prior to surgery-Last dose will be on 07-25-22 Monday  Stop your metFORMIN (GLUCOPHAGE) 2 days prior to surgery-Last dose will be on 07-26-22 Tuesday  Use your Albuterol Nebulizer and Breo Ellipta the morning of surgery and bring your Albuterol Inhaler to the hospital  Take half of your Lantus (25 units) the night before your surgery-NO Insulin the morning of surgery  One week prior to surgery: Stop Anti-inflammatories (NSAIDS) such as Advil, Aleve, Ibuprofen, Motrin, Naproxen, Naprosyn and Aspirin based products such as Excedrin, Goodys Powder, BC Powder.You may however, continue to take Tylenol/Percocet if needed for pain up until the day of surgery.  Stop ANY OVER THE COUNTER supplements/vitamins NOW (07-21-22) until after surgery-You may still continue your Melatonin up until the night prior to  surgery  No Alcohol for 24 hours before or after surgery.  No Smoking including e-cigarettes for 24 hours prior to surgery.  No chewable tobacco products for at least 6 hours prior to surgery.  No nicotine patches on the day of surgery.  Do not use any "recreational" drugs for at least a week prior to your surgery.  Please be advised that the combination of cocaine and anesthesia may have negative outcomes, up to and including death. If you test positive for cocaine, your surgery will be cancelled.  On the morning of surgery brush your teeth with toothpaste and water, you may rinse your mouth with mouthwash if you wish. Do not swallow any toothpaste or mouthwash.  Do not wear jewelry, make-up, hairpins, clips or nail polish.  Do not wear lotions, powders, or perfumes.   Do not shave body from the neck down 48 hours prior to surgery just in case you cut yourself which could leave a site for infection.  Also, freshly shaved skin may become irritated if using the CHG soap.  Contact lenses, hearing aids and dentures may not be worn into surgery.  Do not bring valuables to the hospital. [facility] is not responsible for any missing/lost belongings or valuables.   Bring your C-PAP to the hospital with you   Notify your doctor if there is any change in your medical condition (cold, fever, infection).  Wear comfortable clothing (specific to your surgery type) to the hospital.  After surgery, you can help prevent lung complications by doing breathing exercises.  Take deep breaths and cough every 1-2 hours. Your doctor may order a device called an Incentive Spirometer to help you take deep  breaths. When coughing or sneezing, hold a pillow firmly against your incision with both hands. This is called "splinting." Doing this helps protect your incision. It also decreases belly discomfort.  If you are being admitted to the hospital overnight, leave your suitcase in the car. After surgery it  may be brought to your room.  If you are being discharged the day of surgery, you will not be allowed to drive home. You will need a responsible adult (18 years or older) to drive you home and stay with you that night.   If you are taking public transportation, you will need to have a responsible adult (18 years or older) with you. Please confirm with your physician that it is acceptable to use public transportation.   Please call the [facility]. at [phone] if you have any questions about these instructions.  Surgery Visitation Policy:  Patients undergoing a surgery or procedure may have two family members or support persons with them as long as the person is not COVID-19 positive or experiencing its symptoms.    How to Use an Incentive Spirometer An incentive spirometer is a tool that measures how well you are filling your lungs with each breath. Learning to take long, deep breaths using this tool can help you keep your lungs clear and active. This may help to reverse or lessen your chance of developing breathing (pulmonary) problems, especially infection. You may be asked to use a spirometer: After a surgery. If you have a lung problem or a history of smoking. After a long period of time when you have been unable to move or be active. If the spirometer includes an indicator to show the highest number that you have reached, your health care provider or respiratory therapist will help you set a goal. Keep a log of your progress as told by your health care provider. What are the risks? Breathing too quickly may cause dizziness or cause you to pass out. Take your time so you do not get dizzy or light-headed. If you are in pain, you may need to take pain medicine before doing incentive spirometry. It is harder to take a deep breath if you are having pain. How to use your incentive spirometer  Sit up on the edge of your bed or on a chair. Hold the incentive spirometer  so that it is in an upright position. Before you use the spirometer, breathe out normally. Place the mouthpiece in your mouth. Make sure your lips are closed tightly around it. Breathe in slowly and as deeply as you can through your mouth, causing the piston or the ball to rise toward the top of the chamber. Hold your breath for 3-5 seconds, or for as long as possible. If the spirometer includes a coach indicator, use this to guide you in breathing. Slow down your breathing if the indicator goes above the marked areas. Remove the mouthpiece from your mouth and breathe out normally. The piston or ball will return to the bottom of the chamber. Rest for a few seconds, then repeat the steps 10 or more times. Take your time and take a few normal breaths between deep breaths so that you do not get dizzy or light-headed. Do this every 1-2 hours when you are awake. If the spirometer includes a goal marker to show the highest number you have reached (best effort), use this as a goal to work toward during each repetition. After each set of 10 deep breaths, cough a few  times. This will help to make sure that your lungs are clear. If you have an incision on your chest or abdomen from surgery, place a pillow or a rolled-up towel firmly against the incision when you cough. This can help to reduce pain while taking deep breaths and coughing. General tips When you are able to get out of bed: Walk around often. Continue to take deep breaths and cough in order to clear your lungs. Keep using the incentive spirometer until your health care provider says it is okay to stop using it. If you have been in the hospital, you may be told to keep using the spirometer at home. Contact a health care provider if: You are having difficulty using the spirometer. You have trouble using the spirometer as often as instructed. Your pain medicine is not giving enough relief for you to use the spirometer as told. You have a  fever. Get help right away if: You develop shortness of breath. You develop a cough with bloody mucus from the lungs. You have fluid or blood coming from an incision site after you cough. Summary An incentive spirometer is a tool that can help you learn to take long, deep breaths to keep your lungs clear and active. You may be asked to use a spirometer after a surgery, if you have a lung problem or a history of smoking, or if you have been inactive for a long period of time. Use your incentive spirometer as instructed every 1-2 hours while you are awake. If you have an incision on your chest or abdomen, place a pillow or a rolled-up towel firmly against your incision when you cough. This will help to reduce pain. Get help right away if you have shortness of breath, you cough up bloody mucus, or blood comes from your incision when you cough. This information is not intended to replace advice given to you by your health care provider. Make sure you discuss any questions you have with your health care provider. Document Revised: 12/16/2019 Document Reviewed: 12/16/2019 Elsevier Patient Education  [place].

## 2022-07-21 NOTE — Progress Notes (Signed)
PERIOPERATIVE PRESCRIPTION FOR IMPLANTED CARDIAC DEVICE PROGRAMMING  Patient Information: Name:  Laura [name]  DOB:  12-11-1960  MRN:  [id number]  Planned Procedure: [place]; METATARSAL OSTEOTOMY; HAMMER TOE CORRECTION 2ND      Surgeon:  Dr. Vanessa Kick, DPM  Requesting device clearance: Honor Loh, FNP-C  Date of Procedure:  07/29/2022  Cautery will be used.   Device Information:  Clinic EP Physician:  Cristopher Peru, MD   Device Type:  Pacemaker Manufacturer and Phone #:  [employer]: [phone] Pacemaker Dependent?:  No. Date of Last Device Check:  06/02/2022 Normal Device Function?:  Yes.    Electrophysiologist's Recommendations:  Have magnet available. Provide continuous ECG monitoring when magnet is used or reprogramming is to be performed.  Procedure should not interfere with device function.  No device programming or magnet placement needed.  Per Device Clinic Standing Orders, Damian Leavell, RN  1:15 PM 07/21/2022

## 2022-07-22 ENCOUNTER — Other Ambulatory Visit: Payer: Self-pay

## 2022-07-22 ENCOUNTER — Encounter (HOSPITAL_COMMUNITY): Payer: Self-pay

## 2022-07-22 ENCOUNTER — Emergency Department (HOSPITAL_COMMUNITY)
Admission: EM | Admit: 2022-07-22 | Discharge: 2022-07-22 | Disposition: A | Discharge status: Home / Self Care | Payer: Medicare Other | Attending: Emergency Medicine | Admitting: Emergency Medicine

## 2022-07-22 ENCOUNTER — Emergency Department (HOSPITAL_COMMUNITY): Payer: Medicare Other

## 2022-07-22 ENCOUNTER — Telehealth: Payer: Self-pay | Admitting: Cardiology

## 2022-07-22 DIAGNOSIS — Z950 Presence of cardiac pacemaker: Secondary | ICD-10-CM | POA: Diagnosis not present

## 2022-07-22 DIAGNOSIS — Z7901 Long term (current) use of anticoagulants: Secondary | ICD-10-CM | POA: Diagnosis not present

## 2022-07-22 DIAGNOSIS — G4489 Other headache syndrome: Secondary | ICD-10-CM | POA: Diagnosis not present

## 2022-07-22 DIAGNOSIS — E782 Mixed hyperlipidemia: Secondary | ICD-10-CM | POA: Diagnosis not present

## 2022-07-22 DIAGNOSIS — M545 Low back pain, unspecified: Secondary | ICD-10-CM | POA: Diagnosis not present

## 2022-07-22 DIAGNOSIS — Z79899 Other long term (current) drug therapy: Secondary | ICD-10-CM | POA: Diagnosis not present

## 2022-07-22 DIAGNOSIS — Z7984 Long term (current) use of oral hypoglycemic drugs: Secondary | ICD-10-CM | POA: Diagnosis not present

## 2022-07-22 DIAGNOSIS — I480 Paroxysmal atrial fibrillation: Secondary | ICD-10-CM | POA: Diagnosis not present

## 2022-07-22 DIAGNOSIS — I442 Atrioventricular block, complete: Secondary | ICD-10-CM | POA: Diagnosis not present

## 2022-07-22 DIAGNOSIS — I10 Essential (primary) hypertension: Secondary | ICD-10-CM | POA: Diagnosis not present

## 2022-07-22 DIAGNOSIS — R091 Pleurisy: Secondary | ICD-10-CM | POA: Diagnosis not present

## 2022-07-22 DIAGNOSIS — G4730 Sleep apnea, unspecified: Secondary | ICD-10-CM | POA: Diagnosis not present

## 2022-07-22 DIAGNOSIS — E119 Type 2 diabetes mellitus without complications: Secondary | ICD-10-CM | POA: Diagnosis not present

## 2022-07-22 DIAGNOSIS — R0781 Pleurodynia: Secondary | ICD-10-CM

## 2022-07-22 DIAGNOSIS — R0602 Shortness of breath: Secondary | ICD-10-CM | POA: Diagnosis not present

## 2022-07-22 DIAGNOSIS — Z743 Need for continuous supervision: Secondary | ICD-10-CM | POA: Diagnosis not present

## 2022-07-22 DIAGNOSIS — M2041 Other hammer toe(s) (acquired), right foot: Secondary | ICD-10-CM | POA: Diagnosis not present

## 2022-07-22 DIAGNOSIS — E1140 Type 2 diabetes mellitus with diabetic neuropathy, unspecified: Secondary | ICD-10-CM | POA: Diagnosis not present

## 2022-07-22 DIAGNOSIS — E118 Type 2 diabetes mellitus with unspecified complications: Secondary | ICD-10-CM | POA: Diagnosis not present

## 2022-07-22 DIAGNOSIS — I5032 Chronic diastolic (congestive) heart failure: Secondary | ICD-10-CM | POA: Diagnosis not present

## 2022-07-22 DIAGNOSIS — R079 Chest pain, unspecified: Secondary | ICD-10-CM | POA: Diagnosis not present

## 2022-07-22 LAB — BASIC METABOLIC PANEL
Anion gap: 8 (ref 5–15)
BUN: 23 mg/dL — ABNORMAL HIGH (ref 6–20)
CO2: 19 mmol/L — ABNORMAL LOW (ref 22–32)
Calcium: 9.2 mg/dL (ref 8.9–10.3)
Chloride: 112 mmol/L — ABNORMAL HIGH (ref 98–111)
Creatinine, Ser: 0.67 mg/dL (ref 0.44–1.00)
GFR, Estimated: >60 mL/min (ref >60)
Glucose, Bld: 112 mg/dL — ABNORMAL HIGH (ref 70–99)
Potassium: 4 mmol/L (ref 3.5–5.1)
Sodium: 139 mmol/L (ref 135–145)

## 2022-07-22 LAB — CBC
HCT: 40.2 % (ref 36.0–46.0)
Hemoglobin: 14.2 g/dL (ref 12.0–15.0)
MCH: 32.9 pg (ref 26.0–34.0)
MCHC: 35.3 g/dL (ref 30.0–36.0)
MCV: 93.3 fL (ref 80.0–100.0)
Platelets: 167 10*3/uL (ref 150–400)
RBC: 4.31 MIL/uL (ref 3.87–5.11)
RDW: 13 % (ref 11.5–15.5)
WBC: 5.8 10*3/uL (ref 4.0–10.5)
nRBC: 0 % (ref 0.0–0.2)

## 2022-07-22 LAB — TROPONIN I (HIGH SENSITIVITY)
Troponin I (High Sensitivity): 16 ng/L (ref <18)
Troponin I (High Sensitivity): 16 ng/L (ref <18)

## 2022-07-22 NOTE — ED Triage Notes (Signed)
Pt reports right chest discomfort that comes and goes. Pt reports left chest palpitations. Pt report a headache. SOB present. Pt states it hurts to breathe in. Pt states she feels hot.

## 2022-07-22 NOTE — Telephone Encounter (Signed)
Pt c/o of Chest Pain: STAT if CP now or developed within 24 hours  1. Are you having CP right now? A little bit, she states its around her kidneys and moving up her shoulder  2. Are you experiencing any other symptoms (ex. SOB, nausea, vomiting, sweating)? SOB   3. How long have you been experiencing CP? All afternoon   4. Is your CP continuous or coming and going? Coming and going  5. Have you taken Nitroglycerin? No    Pt states she feel like she has some burning and palpitations on her left side. She states she also has some pain in her back moving up to her shoulder. Pt wants to know if she should go to the ER.  [id number]/62 ?

## 2022-07-22 NOTE — Telephone Encounter (Signed)
Pt states that 4 days ago she started to have pain in her left kidney. She was seen by her PCP today and told that she did not have a kidney infection. Pt states that she is now having right sided breast pain. She rates pain 7/10. She c/o SOB. Current BP is 170/62. Pt encouraged to be seen in the ER. Please advise.

## 2022-07-22 NOTE — Discharge Instructions (Signed)
Your testing tonight has not shown any signs of heart attack, no signs of blood clot, your lungs look normal on x-ray.  Your vital signs of been normal.  I suspect that you have pleurisy and for this reason I want you to take Tylenol 500 mg every 6 hours.  Unfortunately with you on a blood thinner it is unwise to take an anti-inflammatory long-term.  After your surgery if you are still having this discomfort they may need to add some prednisone or another type of anti-inflammatory at that point but until then I would just recommend the Tylenol.  ER for severe worsening symptoms.

## 2022-07-22 NOTE — ED Provider Notes (Signed)
[facility] Provider Note   CSN: [id number] Arrival date & time: 07/22/22  1744     History  No chief complaint on file.   Laura [name] is a 61 y.o. female.  HPI   This patient is a 61 year old female, she has a history of diabetes hypertension high cholesterol and atrial fibrillation on Eliquis.  She had a pacemaker placed in May of this year because of some syncope associated with arrhythmias.  She has not had any issues since that time.  She has never had any ischemic heart disease, and a review of the medical record shows that she had an echocardiogram in May 2023 showing an ejection fraction of 60 to 65% with normal right ventricular systolic function, mild left ventricular hypertrophy.  She had a left heart catheterization in November 2020, this showed no angiographically apparent coronary disease.  The patient is currently on Eliquis and takes this religiously.  She does not take any anti-inflammatories.  The patient reports having some left-sided chest pain which has been present for several days, it is sharp in nature in her left flank.  She was seen by her family doctor who performed a urinalysis to look for kidney stones or infection and states that it was totally normal thus she comes to the hospital for evaluation.  She supposed to have surgery coming up next week on her toe and wanted to have medical clearance from her doctor prior to proceeding with surgery.  She states that the pain on the left side is sharp in nature, radiates to the shoulder is worse with deep breathing, it is not exertional, not positional and not associated with any swelling of the legs.  She also has a vague chest discomfort on the right side which is different than the sharp pain on the left.  She does state that she has been walking with an abnormal gait secondary to the pain on the left hunching over to the left side.  Home Medications Prior to Admission medications    Medication Sig Start Date End Date Taking? Authorizing Provider  acetaminophen (TYLENOL) 325 MG tablet Take 2 tablets (650 mg total) by mouth every 6 (six) hours as needed for mild pain, fever or headache. 08/11/18  Yes Roxan Hockey, MD  albuterol (PROVENTIL) (2.5 MG/3ML) 0.083% nebulizer solution Take 3 mLs (2.5 mg total) by nebulization every 4 (four) hours as needed for wheezing or shortness of breath. 04/07/21  Yes Althea Charon, FNP  albuterol (VENTOLIN HFA) 108 (90 Base) MCG/ACT inhaler INHALE 2 PUFFS INTO THE LUNGS EVERY 6 HOURS AS NEEDED FOR WHEEZING OR SHORTNESS OF BREATH 04/07/21  Yes Althea Charon, FNP  apixaban (ELIQUIS) 5 MG TABS tablet TAKE ONE TABLET BY MOUTH TWICE DAILY $RemoveBef'@9AM'MpQDGNdZqj$  & 5PM Patient taking differently: Take 5 mg by mouth 2 (two) times daily. 05/17/22  Yes Branch, Alphonse Guild, MD  atorvastatin (LIPITOR) 10 MG tablet Take 10 mg by mouth at bedtime.   Yes [provider]  Benralizumab (FASENRA PEN) 30 MG/ML SOAJ Inject 1 mL (30 mg total) into the skin every 28 (twenty-eight) days. Then every 8 weeks 02/15/22  Yes Ambs, Kathrine Cords, FNP  bisoprolol (ZEBETA) 10 MG tablet Take 1 tablet (10 mg total) by mouth daily. Patient taking differently: Take 10 mg by mouth every morning. 06/02/22  Yes [name], MD  diltiazem (CARDIZEM CD) 300 MG 24 hr capsule Take 1 capsule (300 mg total) by mouth daily. Patient taking differently: Take 300 mg by mouth  every morning. 07/11/22  Yes BranchAlphonse Guild, MD  divalproex (DEPAKOTE ER) 500 MG 24 hr tablet Take 1 tablet (500 mg total) by mouth daily AND 2 tablets (1,000 mg total) at bedtime. Patient taking differently: Take 1 tablet (500 mg total) in AM  AND 2 tablets (1,500 mg total) at bedtime. 07/27/22 10/25/22 Yes Hisada, Elie Goody, MD  EPINEPHrine 0.3 mg/0.3 mL IJ SOAJ injection Inject 0.3 mg into the muscle once as needed for anaphylaxis. 01/17/20  Yes [provider]  fluticasone furoate-vilanterol (BREO ELLIPTA) 100-25 MCG/ACT AEPB  INHALE 1 PUFF INTO LUNGS DAILY (BULK) Patient taking differently: 1 puff every morning. 03/18/22  Yes Rigoberto Noel, MD  gabapentin (NEURONTIN) 600 MG tablet Take 600 mg by mouth 3 (three) times daily. 07/20/21  Yes [provider]  hydrOXYzine (ATARAX) 25 MG tablet Take 25 mg by mouth every 8 (eight) hours as needed. 07/05/22  Yes [provider]  LANTUS SOLOSTAR 100 UNIT/ML Solostar Pen INJECT 70 UNITS SUBCUTANEOUSLY AT BEDTIME (BULK) Patient taking differently: 50 Units at bedtime. 06/20/22  Yes Nida, Marella Chimes, MD  levalbuterol [facility] HFA) 45 MCG/ACT inhaler Inhale 2 puffs into the lungs every 6 (six) hours as needed. 02/10/22  Yes [provider]  levalbuterol Penne Lash) 1.25 MG/3ML nebulizer solution Inhale 1.25 mg into the lungs daily as needed (asthma). 02/09/22  Yes [provider]  levothyroxine (SYNTHROID, LEVOTHROID) 112 MCG tablet Take 112 mcg by mouth daily before breakfast.   Yes [provider]  linaclotide (LINZESS) 145 MCG CAPS capsule Take 1 capsule (145 mcg total) by mouth daily before breakfast. Office visit needed before further refills 02/11/22  Yes Mahala Menghini, PA-C  losartan (COZAAR) 50 MG tablet Take 50 mg by mouth every morning.   Yes [provider]  MELATONIN GUMMIES PO Take 10 mg by mouth at bedtime.   Yes [provider]  metFORMIN (GLUCOPHAGE) 500 MG tablet Take 500 mg by mouth 2 (two) times daily. 07/21/21  Yes [provider]  metoCLOPramide (REGLAN) 5 MG tablet Take 5 mg by mouth 3 (three) times daily. 04/05/21  Yes [provider]  montelukast (SINGULAIR) 10 MG tablet Take 10 mg by mouth at bedtime. 11/23/20  Yes [provider]  omeprazole (PRILOSEC) 20 MG capsule TAKE 1 CAPSULE(20 MG) BY MOUTH TWICE DAILY BEFORE A MEAL Patient taking differently: 20 mg 2 (two) times daily before a meal. 04/07/22  Yes Valentina Shaggy, MD  ondansetron (ZOFRAN-ODT) 8 MG disintegrating  tablet Take 8 mg by mouth daily as needed for vomiting or nausea.   Yes [provider]  oxyCODONE-acetaminophen (PERCOCET) 5-325 MG tablet Take 1 tablet by mouth every 6 (six) hours as needed. 03/14/22  Yes Milton Ferguson, MD  polyethylene glycol (MIRALAX / GLYCOLAX) 17 g packet Take 17 g by mouth daily as needed. Patient taking differently: Take 17 g by mouth daily as needed for moderate constipation or severe constipation. 04/17/21  Yes [place] Eric J, DO  Potassium Chloride ER 20 MEQ TBCR Take 20 mEq by mouth daily. 01/04/21  Yes [provider]  topiramate (TOPAMAX) 100 MG tablet Take 1 tablet (100 mg total) by mouth 2 (two) times daily. Patient taking differently: Take 100-150 mg by mouth 2 (two) times daily. 150 mg in am and 100 mg at night 07/13/22  Yes Tat, Eustace Quail, DO  torsemide (DEMADEX) 20 MG tablet Take 1 tablet daily for fluid gain of 3 lb overnight or 5 lbs in a week. Patient  taking differently: Take 20 mg by mouth as needed. Take 1 tablet daily for fluid gain of 3 lb overnight or 5 lbs in a week. 01/25/22  Yes BranchAlphonse Guild, MD  triamcinolone cream (KENALOG) 0.1 % Apply 1 application. topically daily as needed (foot). 03/15/21  Yes [provider]  TRULICITY 3 ID/0.3UD SOPN INJECT $RemoveBef'3MG'WzLDNzlGAi$  AS DIRECTED ONCE A WEEK (BULK) Patient taking differently: Inject 3 mg as directed once a week. On Sundays 06/24/22  Yes Nida, Marella Chimes, MD  BD PEN NEEDLE NANO 2ND GEN 32G X 4 MM MISC 1 each by Other route in the morning, at noon, in the evening, and at bedtime. 05/16/22   Cassandria Anger, MD  blood glucose meter kit and supplies 1 each by Other route 4 (four) times daily. Dispense based on patient and insurance preference. Use up to four times daily as directed. (FOR ICD-10 E10.9, E11.9). 01/27/21   Cassandria Anger, MD  Cholecalciferol (VITAMIN D3) 125 MCG (5000 UT) CAPS Take 1 capsule (5,000 Units total) by mouth daily. Patient not taking: Reported on 07/22/2022  11/25/20   Cassandria Anger, MD  CINNAMON PO Take 1 tablet by mouth daily at 6 (six) AM. Patient not taking: Reported on 07/22/2022    [provider]  Continuous Blood Gluc Sensor (FREESTYLE LIBRE 3 SENSOR) MISC 1 Piece by Does not apply route every 14 (fourteen) days. Place 1 sensor on the skin every 14 days. Use to check glucose continuously 10/28/21   Cassandria Anger, MD  CUVITRU 1 GM/5ML SOLN Inject into the skin. Patient not taking: Reported on 07/21/2022 06/22/22   [provider]  CUVITRU 10 GM/50ML SOLN Inject 25 mg into the skin every 14 (fourteen) days. Patient not taking: Reported on 07/21/2022 01/24/22   [provider]  CUVITRU 2 GM/10ML SOLN Inject into the skin. Patient not taking: Reported on 07/21/2022 06/22/22   [provider]  CUVITRU 4 GM/20ML SOLN Inject into the skin. Patient not taking: Reported on 07/21/2022 06/22/22   [provider]  diltiazem (CARDIZEM CD) 180 MG 24 hr capsule Take 180 mg by mouth daily. Patient not taking: Reported on 07/22/2022 07/05/22   [provider]  ELDERBERRY PO Take 4 g by mouth daily. Gummies 2g each Patient not taking: Reported on 07/22/2022    [provider]  ferrous sulfate 324 MG TBEC Take 324 mg by mouth every evening. Patient not taking: Reported on 07/22/2022    [provider]  Immune Globulin, Human, (CUVITRU [place]) Inject 27 mg into the skin every 14 (fourteen) days. 12/24/21   [provider]  Insulin Pen Needle (PEN NEEDLES 3/16") 31G X 5 MM MISC Use as directed with insulin pen 10/21/21   Cassandria Anger, MD  Multiple Vitamins-Minerals (MULTIVITAMIN WITH MINERALS) tablet Take 1 tablet by mouth daily. Woman Patient not taking: Reported on 07/22/2022    [provider]  Nebulizer MISC Nebulizer tubing kit 10/21/20   Valentina Shaggy, MD  olmesartan (BENICAR) 40 MG tablet TAKE ONE TABLET BY MOUTH DAILY AT 9AM Patient not taking:  Reported on 07/22/2022    [provider]  [facility] ULTRA test strip 1 each by Other route in the morning, at noon, in the evening, and at bedtime. 10/17/19   [provider]  Spacer/Aero-Holding Chambers (AEROCHAMBER PLUS) inhaler 1 each by Other route as needed for other. Use as instructed 02/07/22   Dara Hoyer, FNP  topiramate (TOPAMAX) 50 MG tablet Take  1 tablet (50 mg total) by mouth daily. Patient not taking: Reported on 07/21/2022 07/11/22   Tat, Eustace Quail, DO  TURMERIC PO Take 1 tablet by mouth daily at 6 (six) AM. Patient not taking: Reported on 07/22/2022    [provider]  vitamin B-12 (CYANOCOBALAMIN) 1000 MCG tablet Take 1,000 mcg by mouth daily. Patient not taking: Reported on 07/22/2022    [provider]  vitamin C (ASCORBIC ACID) 500 MG tablet Take 500 mg by mouth daily. Power C immune support Patient not taking: Reported on 07/22/2022    [provider]      Allergies    Ativan [lorazepam] and Phenergan [promethazine hcl]    Review of Systems   Review of Systems  All other systems reviewed and are negative.   Physical Exam Updated Vital Signs BP (!) 134/53   Pulse 61   Temp 98.5 F (36.9 C) (Oral)   Resp 20   Ht 1.626 m ($Remove'5\' 4"'NmoyeLn$ )   Wt 98.9 kg   SpO2 97%   BMI 37.42 kg/m  Physical Exam Vitals and nursing note reviewed.  Constitutional:      General: She is not in acute distress.    Appearance: She is well-developed.  HENT:     Head: Normocephalic and atraumatic.     Mouth/Throat:     Pharynx: No oropharyngeal exudate.  Eyes:     General: No scleral icterus.       Right eye: No discharge.        Left eye: No discharge.     Conjunctiva/sclera: Conjunctivae normal.     Pupils: Pupils are equal, round, and reactive to light.  Neck:     Thyroid: No thyromegaly.     Vascular: No JVD.  Cardiovascular:     Rate and Rhythm: Normal rate and regular rhythm.     Heart sounds: Normal heart sounds. No murmur heard.     No friction rub. No gallop.  Pulmonary:     Effort: Pulmonary effort is normal. No respiratory distress.     Breath sounds: Normal breath sounds. No wheezing or rales.  Chest:     Chest wall: No tenderness.  Abdominal:     General: Bowel sounds are normal. There is no distension.     Palpations: Abdomen is soft. There is no mass.     Tenderness: There is no abdominal tenderness.  Musculoskeletal:        General: No tenderness. Normal range of motion.     Cervical back: Normal range of motion and neck supple.     Right lower leg: No edema.     Left lower leg: No edema.  Lymphadenopathy:     Cervical: No cervical adenopathy.  Skin:    General: Skin is warm and dry.     Findings: No erythema or rash.  Neurological:     Mental Status: She is alert.     Coordination: Coordination normal.  Psychiatric:        Behavior: Behavior normal.     ED Results / Procedures / Treatments   Labs (all labs ordered are listed, but only abnormal results are displayed) Labs Reviewed  BASIC METABOLIC PANEL - Abnormal; Notable for the following components:      Result Value   Chloride 112 (*)    CO2 19 (*)    Glucose, Bld 112 (*)    BUN 23 (*)    All other components within normal limits  CBC  TROPONIN I (HIGH  SENSITIVITY)  TROPONIN I (HIGH SENSITIVITY)    EKG EKG Interpretation  Date/Time:  Friday July 22 2022 17:57:44 EDT Ventricular Rate:  63 PR Interval:  232 QRS Duration: 116 QT Interval:  444 QTC Calculation: 454 R Axis:   -75 Text Interpretation: AV dual-paced rhythm with prolonged AV conduction Abnormal ECG When compared with ECG of 18-Mar-2022 13:37, Vent. rate has decreased BY  17 BPM Confirmed by Noemi Chapel [phone]) on 07/22/2022 7:11:51 PM  Radiology DG Chest 2 View  Result Date: 07/22/2022 CLINICAL DATA:  Chest pain.  Shortness of breath. EXAM: CHEST - 2 VIEW COMPARISON:  Chest two views 03/18/2022 and 03/03/2022 FINDINGS: Left chest wall cardiac pacer is seen  with leads overlying the right atrium and right ventricle. Cardiac silhouette and mediastinal contours are within normal limits. Moderate calcification within the aortic arch. Mild bilateral lower lung chronic interstitial thickening. No pleural effusion pneumothorax. Mild to moderate multilevel degenerative disc changes of the thoracic spine. IMPRESSION: 1. No acute lung process. 2. Mild bilateral lower lung chronic interstitial thickening. Electronically Signed   By: Yvonne Kendall M.D.   On: 07/22/2022 18:17    Procedures Procedures    Medications Ordered in ED Medications - No data to display  ED Course/ Medical Decision Making/ A&P                           Medical Decision Making Amount and/or Complexity of Data Reviewed Labs: ordered. Radiology: ordered.   This patient presents to the ED for concern of left-sided chest pain, this involves an extensive number of treatment options, and is a complaint that carries with it a high risk of complications and morbidity.  The differential diagnosis includes C, pulmonary embolism, coronary disease, pneumothorax, pericarditis, complications of pacemaker   Co morbidities that complicate the patient evaluation  Diabetes, A-fib, anticoagulated   Additional history obtained:  Additional history obtained from electronic medical record External records from outside source obtained and reviewed including prior records in work-up of heart including echocardiogram and left heart cath, see history above   Lab Tests:  I Ordered, and personally interpreted labs.  The pertinent results include: BBC and metabolic panel overall unremarkable, no findings.  Troponin is 16, this is similar to measurements in the past.  Repeat troponin pending   Imaging Studies ordered:  I ordered imaging studies including chest x-ray I independently visualized and interpreted imaging which showed no acute findings, pacemaker leads appear normal without any signs of  fracture, no pneumothorax I agree with the radiologist interpretation   Cardiac Monitoring: / EKG:  The patient was maintained on a cardiac monitor.  I personally viewed and interpreted the cardiac monitored which showed an underlying rhythm of: Normal sinus rhythm   Problem List / ED Course / Critical interventions / Medication management  The patient has pleuritic chest pain but a negative work-up, troponin is negative x2, metabolic panel unremarkable as is the CBC showing no leukocytosis or anemia. Patient declines anti-inflammatories I have reviewed the patients home medicines and have made adjustments as needed   Social Determinants of Health:  Already anticoagulated   Test / Admission - Considered:  Considered admission but the patient is extremely low risk for pulmonary embolism and the work-up is negative for any other acute findings that would suggest an alternative pathological cause.  She very clearly has a syndrome that would be consistent with pleurisy  I have discussed with the patient at the bedside  the results, and the meaning of these results.  They have expressed her understanding to the need for follow-up with primary care physician         Final Clinical Impression(s) / ED Diagnoses Final diagnoses:  Chest pain, pleuritic    Rx / DC Orders ED Discharge Orders     None         Noemi Chapel, MD 07/22/22 2120

## 2022-07-22 NOTE — Progress Notes (Signed)
  Perioperative Services Pre-Admission/Anesthesia Testing    Date: 07/22/22  Name: Laura [name] MRN:   [id number]  Re: GLP-1 clearance and provider recommendations   Planned Surgical Procedure(s):    Case: [id number] Date/Time: 07/29/22 0715   Procedures:      HALLUX VALGUS AUSTIN WITH Barbie Banner (Right)     METATARSAL OSTEOTOMY (Right: Toe)     HAMMER TOE CORRECTION 2ND (Right: Toe)   Anesthesia type: Choice   Pre-op diagnosis: BUNION, HAMMERTOE, PLANTAR FLEXED METATARSAL   Location: [facility] OR ROOM 02 / [facility]   Surgeons: [name], DPM   Clinical Notes:  Patient is scheduled for the above procedure on 07/29/2022 with Dr. Lanae Crumbly, MD. In review of her medication reconciliation it was noted that patient is on a prescribed GLP-1 medication. Per guidelines issued by the American Society of Anesthesiologists (ASA), it is recommended that these medications be held for 7 days prior to the patient undergoing any type of elective surgical procedure. The patient is taking the following GLP-1 medication:  '[]'$  SEMAGLUTIDE   '[]'$  EXENATIDE  '[]'$  LIRAGLUTIDE   '[]'$  LIXISENATIDE  '[x]'$  DULAGLUTIDE     '[]'$  OTHER GLP-1 medication: _______________  Reached out to prescribing provider Dorris Fetch, MD) to make them aware of the guidelines from anesthesia. Given that this patient takes the prescribed GLP-1 medication for her  diabetes diagnosis, rather than for weight loss, recommendations from the prescribing provider were solicited. Prescribing provider made aware of the following so that informed decision/POC can be developed for this patient that may be taking medications belonging to these drug classes:  Oral GLP-1 medications will be held 1 day prior to surgery.  Injectable GLP-1 medications will be held 7 days prior to surgery.  Metformin is routinely held 48 hours prior to surgery due to renal concerns, potential need for contrasted imaging perioperatively, and the potential  for tissue hypoxia leading to drug induced lactic acidosis.  All SGLT2i medications are held 72 hours prior to surgery as they can be associated with the increased potential for developing euglycemic diabetic ketoacidosis (EDKA).   Impression and Plan:  Laura [name] is on a prescribed GLP-1 medication, which induces the known side effect of decreased gastric emptying. Efforts are bring made to mitigate the risk of perioperative hyperglycemic events, as elevated blood glucose levels have been found to contribute to intra/postoperative complications. Additionally, hyperglycemic extremes can potentially necessitate the postponing of a patient's elective case in order to better optimize perioperative glycemic control, again with the aforementioned guidelines in place. With this in mind, recommendations have been sought from the prescribing provider, who has cleared patient to proceed with holding the prescribed GLP-1 as per the guidelines from the ASA.   Provider recommending: no further recommendations received from the prescribing provider.  Copy of signed clearance and recommendations placed on patient's chart for inclusion in their medical record and for review by the surgical/anesthetic team on the day of her procedure.   Honor Loh, MSN, APRN, FNP-C, CEN [facility]  Peri-operative Services Nurse Practitioner Phone: [phone] 07/22/22 11:16 AM  NOTE: This note has been prepared using Dragon dictation software. Despite my best ability to proofread, there is always the potential that unintentional transcriptional errors may still occur from this process.

## 2022-07-25 ENCOUNTER — Telehealth: Payer: Self-pay | Admitting: Neurology

## 2022-07-25 NOTE — Telephone Encounter (Signed)
Patient with diagnosis of afib on Eliquis for anticoagulation.    Procedure: RIGHT HALLUX VALGUS AUSTIN WITH AIKEN; METATARSAL OSTEOTOMY; HAMMER TOE CORRECTION 2ND Date of procedure: 07/29/22   CHA2DS2-VASc Score = 6   This indicates a 9.7% annual risk of stroke. The patient's score is based upon: CHF History: 1 HTN History: 1 Diabetes History: 1 Stroke History: 2 (TIA) Vascular Disease History: 0 Age Score: 0 Gender Score: 1      CrCl 102 ml/min  Patient self reports history of TIA several years ago. No outside records of this are available.    ACC/AHA recommends against bridging with DOCAs  Per office protocol, patient can hold Eliquis for 2 days prior to procedure.    **This guidance is not considered finalized until pre-operative APP has relayed final recommendations.**

## 2022-07-25 NOTE — Telephone Encounter (Signed)
   Name: Laura [name]  DOB: 1961-03-12  MRN: [id number]   Primary Cardiologist: Carlyle Dolly, MD  Chart reviewed as part of pre-operative protocol coverage. Patient was contacted 07/25/2022 in reference to pre-operative risk assessment for pending surgery as outlined below.  Laura [name] was last seen on 07/11/2022 by dr. Harl Bowie.  Since that day, Laura [name] has done cardiac standpoint.  She did have an ED visit for left-sided flank pain.  EKG was unremarkable, troponin was negative.  She denies any chest pain, dyspnea, or other symptoms concerning for angina. She is able to complete greater than 4 METS without difficulty.  Therefore, based on ACC/AHA guidelines, the patient would be at acceptable risk for the planned procedure without further cardiovascular testing.   The patient was advised that if she develops new symptoms prior to surgery to contact our office to arrange for a follow-up visit, and she verbalized understanding.  Per office protocol, patient can hold Eliquis for 2 days prior to procedure.  Please resume Eliquis as soon as possible postprocedure, at the discretion of the surgeon.  I will route this recommendation to the requesting party via Epic fax function and remove from pre-op pool. Please call with questions.  Lenna Sciara, NP 07/25/2022, 11:48 AM

## 2022-07-25 NOTE — Telephone Encounter (Signed)
Patient called requesting a call back from [facility] about her losartan.

## 2022-07-25 NOTE — Telephone Encounter (Signed)
Calling cardio about losartan

## 2022-07-26 ENCOUNTER — Ambulatory Visit: Payer: Medicare Other | Admitting: Physician Assistant

## 2022-07-26 ENCOUNTER — Ambulatory Visit (INDEPENDENT_AMBULATORY_CARE_PROVIDER_SITE_OTHER): Payer: Medicare Other | Admitting: Clinical

## 2022-07-26 DIAGNOSIS — I10 Essential (primary) hypertension: Secondary | ICD-10-CM | POA: Diagnosis not present

## 2022-07-26 DIAGNOSIS — F4310 Post-traumatic stress disorder, unspecified: Secondary | ICD-10-CM

## 2022-07-26 DIAGNOSIS — R0789 Other chest pain: Secondary | ICD-10-CM | POA: Diagnosis not present

## 2022-07-26 DIAGNOSIS — D839 Common variable immunodeficiency, unspecified: Secondary | ICD-10-CM | POA: Diagnosis not present

## 2022-07-26 NOTE — Progress Notes (Signed)
Virtual Visit via Video Note   I connected with Laura [name] on 07/26/2022 at  10:00 AM EST by a video enabled telemedicine application and verified that I am speaking with the correct person using two identifiers.   Location: Patient: Home Provider: Office   I discussed the limitations of evaluation and management by telemedicine and the availability of in person appointments. The patient expressed understanding and agreed to proceed.   THERAPIST PROGRESS NOTE   Session Time: 10:00 AM-:10:45 AM   Participation Level: Active   Behavioral Response: CasualAlertDepressed   Type of Therapy: Individual Therapy   Treatment Goals addressed: Coping   Interventions: CBT, DBT, Solution Focused, Strength-based and Supportive   Summary: Laura [name] is a 61 y.o. female who presents with PTSD. The OPT therapist worked with the patient for her ongoing OPT treatment session. The OPT therapist utilized Motivational Interviewing to assist in creating therapeutic repore.The patient in the session was engaged and work in collaboration giving feedback about her triggers and symptoms over the past few weeks.The patient spoke about preparation for upcoming foot surgery in the coming week and spoke about getting clearance from her specialist (Pulmonologist/ Cardiologist) and getting the last clearance needed by her PCP who she is schedule to see later today The OPT therapist utilized Cognitive Behavioral Therapy through cognitive restructuring as well as worked with the patient on coping strategies to assist in management of mental health symptoms. The patient verbalized, " Until November 30 th I will have the boot on after my surgery and a pen in my toe until then and I wont be able wash my foot or be very mobile until then but I should be walking on it right away".  The patient spoke about getting the home set up so that its easier to get around in the home post foot surgery. The OPT therapist placed emphasis  on the patient keeping all upcoming health appointments and adhering to ongoing health treatment recommendations. The OPT therapist reviewed all appointments listed coming up in the patients mychart.    Suicidal/Homicidal: Nowithout intent/plan   Therapist Response:The OPT therapist worked with the patient for the patients scheduled session. The patient was engaged in her session and gave feedback in relation to triggers, symptoms, and behavior responses over the past few weeks. The OPT therapist worked with the patient utilizing an in session Cognitive Behavioral Therapy exercise.  The patient spoke about her upcoming foot surgery within the next week. The patient spoke about her considering making changes with her Will most notably around [place] one of her daughters. The relationship between the patient and her daughter has been historically conflictual. The patients interaction with other family members has been a staple in the patients MH and at times triggering to her MH symptoms.The OPT therapist worked with the patient in preparation for her upcoming surgery as well as her recovery time in looking at coping strategies for when she is recovering. The patient spoke about starting to talk to some one new and developing a relationship online. The patient spoke about her travel plans to go with her kids to [place] next year in the Fall and spread her husbands ashes.The OPT therapist will continue treatment work with the patient in her next scheduled session.   Plan: Return again in 2/3 weeks.   Diagnosis:      Axis I:Post Traumatic Stress Disorder  Axis II: No diagnosis     Collaboration of Care: No additional collaboration for this scheduled session.   Patient/Guardian was advised Release of Information must be obtained prior to any record release in order to collaborate their care with an outside provider. Patient/Guardian was advised if they have not already done so to  contact the registration department to sign all necessary forms in order for [place] to release information regarding their care.    Consent: Patient/Guardian gives verbal consent for treatment and assignment of benefits for services provided during this visit. Patient/Guardian expressed understanding and agreed to proceed.      I discussed the assessment and treatment plan with the patient. The patient was provided an opportunity to ask questions and all were answered. The patient agreed with the plan and demonstrated an understanding of the instructions.   The patient was advised to call back or seek an in-person evaluation if the symptoms worsen or if the condition fails to improve as anticipated.   I provided 45 minutes of non-face-to-face time during this encounter.   [name], LCSW   07/26/2022

## 2022-07-27 ENCOUNTER — Ambulatory Visit (INDEPENDENT_AMBULATORY_CARE_PROVIDER_SITE_OTHER): Payer: Medicare Other | Admitting: Physician Assistant

## 2022-07-27 ENCOUNTER — Encounter: Payer: Self-pay | Admitting: Physician Assistant

## 2022-07-27 ENCOUNTER — Encounter: Payer: Self-pay | Admitting: "Endocrinology

## 2022-07-27 ENCOUNTER — Other Ambulatory Visit (INDEPENDENT_AMBULATORY_CARE_PROVIDER_SITE_OTHER): Payer: Medicare Other

## 2022-07-27 ENCOUNTER — Telehealth: Payer: Self-pay | Admitting: Cardiology

## 2022-07-27 ENCOUNTER — Ambulatory Visit (INDEPENDENT_AMBULATORY_CARE_PROVIDER_SITE_OTHER): Payer: Medicare Other | Admitting: "Endocrinology

## 2022-07-27 ENCOUNTER — Encounter: Payer: Medicare Other | Attending: Internal Medicine | Admitting: Nutrition

## 2022-07-27 ENCOUNTER — Encounter: Payer: Self-pay | Admitting: Nutrition

## 2022-07-27 ENCOUNTER — Encounter: Payer: Self-pay | Admitting: Podiatry

## 2022-07-27 VITALS — BP 192/94 | HR 77 | Resp 18 | Ht 64.0 in | Wt 218.0 lb

## 2022-07-27 VITALS — Ht 64.0 in | Wt 218.0 lb

## 2022-07-27 VITALS — BP 130/74 | HR 60 | Ht 64.0 in | Wt 218.2 lb

## 2022-07-27 DIAGNOSIS — E669 Obesity, unspecified: Secondary | ICD-10-CM | POA: Insufficient documentation

## 2022-07-27 DIAGNOSIS — I10 Essential (primary) hypertension: Secondary | ICD-10-CM | POA: Insufficient documentation

## 2022-07-27 DIAGNOSIS — E1165 Type 2 diabetes mellitus with hyperglycemia: Secondary | ICD-10-CM

## 2022-07-27 DIAGNOSIS — E559 Vitamin D deficiency, unspecified: Secondary | ICD-10-CM | POA: Diagnosis not present

## 2022-07-27 DIAGNOSIS — E782 Mixed hyperlipidemia: Secondary | ICD-10-CM | POA: Diagnosis not present

## 2022-07-27 DIAGNOSIS — E039 Hypothyroidism, unspecified: Secondary | ICD-10-CM | POA: Diagnosis not present

## 2022-07-27 DIAGNOSIS — M5412 Radiculopathy, cervical region: Secondary | ICD-10-CM

## 2022-07-27 DIAGNOSIS — G629 Polyneuropathy, unspecified: Secondary | ICD-10-CM

## 2022-07-27 DIAGNOSIS — Z6835 Body mass index (BMI) 35.0-35.9, adult: Secondary | ICD-10-CM | POA: Diagnosis present

## 2022-07-27 NOTE — Telephone Encounter (Signed)
Pt c/o medication issue:  1. Name of Medication:   losartan (COZAAR) 50 MG tablet    2. How are you currently taking this medication (dosage and times per day)? Not sure   3. Are you having a reaction (difficulty breathing--STAT)?   4. What is your medication issue? Pt is completely out of this medication and the pharmacy is saying its too soon for her to pick up more. She had to pay out of pocket for 5 pills last Friday 07/22/22. Pt also does not know if this medication is to increased or not. Per Dr. Oralia Rud note 07/22/22, he wanted to increase to '75mg'$ . Please advise

## 2022-07-27 NOTE — Progress Notes (Signed)
Perioperative Services  Pre-Admission/Anesthesia Testing Clinical Review  Date: 07/27/22  Patient Demographics:  Name: Laura [name] DOB:   1960/12/28 MRN:   [id number]  Planned Surgical Procedure(s):    Case: [id number] Date/Time: 07/29/22 0715   Procedures:      HALLUX VALGUS [place]UST[place] WITH Barbie Banner (Right)     METATARSAL OSTEOTOMY (Right: Toe)     HAMMER TOE CORRECTION 2ND (Right: Toe)   Anesthesia type: Choice   Pre-op diagnosis: BUNION, HAMMERTOE, PLANTAR FLEXED METATARSAL   Location: [facility] OR ROOM 02 / [place] [facility]   Surgeons: [name], DPM   NOTE: Available PAT nursing documentation and vital signs have been reviewed. Clinical nursing staff has updated patient's PMH/PSHx, current medication list, and drug allergies/intolerances to ensure comprehensive history available to assist in medical decision making as it pertains to the aforementioned surgical procedure and anticipated anesthetic course. Extensive review of available clinical information performed. [facility] and [facility] updated with any diagnoses/procedures that  may have been inadvertently omitted during her intake with the pre-admission testing department's nursing staff.  Clinical Discussion:  Laura [name] is a 61 y.o. female who is submitted for pre-surgical anesthesia review and clearance prior to her undergoing the above procedure. Patient is a Former Smoker (28 pack years; quit 10/2006). 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  Patient is followed by cardiology Harl Bowie, MD). She was last seen in  the cardiology clinic on 07/11/2022; notes reviewed.  At the time of her clinic visit, patient doing well overall from a cardiovascular perspective.  Patient with an admission for atrial fibrillation with associated cardiogenic syncope secondary to bradycardia.  Additionally, patient with an ED visit for nonspecific chest pain in 03/2022.  Since that time, patient with no recurrent episodes of chest pain, increased shortness of breath beyond her baseline, PND, orthopnea, significant peripheral edema, vertiginous symptoms, or presyncope/syncope.  Patient with a severe asthma diagnosis for which she is followed by an allergist; reports well-controlled on currently prescribed interventions. Patient with daily episodes of intermittent palpitations.  Past medical history significant for cardiovascular diagnoses.  Patient with a history of a congenital ASD that was repaired as a child.  Patient with no resulting significant cardiovascular abnormality (shunting) noted on studies as an adult per her report.  Patient reports a history of experiencing multiple TIAs (3) in the past.  Her last TIA was in 2015.  Diagnostic LEFT heart catheterization was performed on 08/30/2019 revealing a normal left ventricular systolic function with a normal LVEF.  There were no valvular abnormalities noted.  Aortic atherosclerosis/calcification was noted.  There was no angiographically apparent coronary artery disease observed.  Recommendations were for continued risk factor modification.  Long-term cardiac event monitor study performed on 01/12/2022 revealed a predominant underlying atrial fibrillation with a 100% burden.  Average heart rate was 61 bpm; range 35-117 bpm).  6 short duration pauses occurred nocturnally with the longest lasting 3.6 seconds.  All reported symptoms corresponded with atrial fibrillation.  Ventricular bigeminy and trigeminy were present.  Secondary to cardiogenic syncope and resulting symptomatic  bradycardia, patient underwent placement of a St. Jude's dual-chamber permanent pacemaker on 03/03/2022.  Device is regularly interrogated by patient's primary electrophysiology team; last interrogated  on 06/02/2022 with normal function noted.  Most recent TTE was performed on 02/08/2022 revealing a normal left ventricular systolic function with an EF of 60-65%.  There were no regional wall motion abnormalities.  There was mild left ventricular hypertrophy.  Right ventricular size and function was normal.  There was mild left atrial dilatation.  Mild mitral valve stenosis noted; peak gradient 14.6 mmHg and mean gradient 4.0 mmHg.  Aortic valve with annular calcification and no evidence of regurgitation or stenosis.  Patient with an atrial fibrillation diagnosis; CHA2DS2-VASc Score = 6 (sex, HTN, TIA x 2, vascular disease history, T2DM).patient underwent DCCV procedure in approximately 2009.  Patient underwent a second DCCV procedure on 08/29/2018 whereby she received a single 200 J cardioversion resulting in conversion to sinus rhythm with first-degree AV block.  Her rate and rhythm are currently being maintained on oral diltiazem + bisoprolol. She is chronically anticoagulated using apixaban; reported to be compliant with therapy with no evidence or reports of GI bleeding.  Blood pressure reasonably controlled at 134/68 mmHg on currently prescribed beta-blocker (bisoprolol), CCB (diltiazem), and ARB (olmesartan) therapies. She is on a atorvastatin for her HLD diagnosis and further ASCVD prevention. T2DM loosely controlled on currently prescribed regimen; last HgbA1c was 7.7% when checked on 07/19/2022. Functional capacity, as defined by DASI, is documented as being >/= 4 METS.  No changes were made to her medication regimen.  Patient follow-up with outpatient cardiology in 6 months or sooner if needed.  Laura [name] is scheduled for an [place] (Right); METATARSAL OSTEOTOMY (Right:  Toe); 2ND HAMMER TOE CORRECTION (Right) on 07/29/2022 with Dr. Lanae Crumbly, DPM.  Given patient's past medical history significant for cardiovascular diagnoses, presurgical cardiac clearance was sought by the PAT team. Per cardiology, "based ACC/AHA guidelines, the patient's past medical history, and the amount of time since her last clinic visit, this patient would be at an overall ACCEPTABLE risk for the planned procedure without further cardiovascular testing or intervention at this time". In review of her medication reconciliation, it is noted that patient is currently on prescribed anticoagulation therapy. She has been instructed on recommendations for holding her apixaban for 3 days prior to her procedure with plans to restart as soon as postoperative bleeding risk felt to be minimized by her attending surgeon. The patient has been instructed that her last dose of her anticoagulant will be on 07/25/2022.  Patient reports previous perioperative complications with anesthesia in the past. Patient making mentioned on an "allergic reaction" to combination of perioperative analgesics + anesthetic medications utilized for a past surgical case in [place]. Patient unsure what was used, nor was she able to recount what exactly happened. Patient states, "I ended up in the ICU for 5 days after surgery". In review of the available records, it is noted that patient underwent a general anesthetic course at [facility] ([facility] in 01/2021 without documented complications.      07/27/2022    3:06 PM 07/27/2022   10:54 AM 07/27/2022   10:32 AM  Vitals with BMI  Height 5' 4" 5' 4" 5' 4"  Weight 218 lbs 218 lbs 218 lbs 3 oz  BMI 37.4 00.7 12.19  Systolic 758  832  Diastolic 94  74  Pulse 77  60    Providers/Specialists:   NOTE: Primary physician provider listed below. Patient may have been seen by APP or partner within same practice.   PROVIDER ROLE / SPECIALTY LAST OV  [name], DPM  Podiatry (Surgeon) 05/03/2022  [name], MD Primary Care Provider 07/22/2022  Carlyle Dolly, MD Cardiology 07/11/2022  Salvatore Marvel, MD Allergist 07/13/2022  Loni Beckwith, MD Endocrinology 06/08/2022  Alonza Bogus, MD Neurology 05/30/2022   Allergies:  Ativan [lorazepam] and Phenergan [promethazine hcl]  Current Home Medications:    Benralizumab SOSY 30 mg    acetaminophen (TYLENOL) 325 MG tablet   albuterol (PROVENTIL) (2.5 MG/3ML) 0.083% nebulizer solution   albuterol (VENTOLIN HFA) 108 (90 Base) MCG/ACT inhaler   apixaban (ELIQUIS) 5 MG TABS tablet   atorvastatin (LIPITOR) 10 MG tablet   BD PEN NEEDLE NANO 2ND GEN 32G X 4 MM MISC   Benralizumab (FASENRA PEN) 30 MG/ML SOAJ   bisoprolol (ZEBETA) 10 MG tablet   blood glucose meter kit and supplies   Cholecalciferol (VITAMIN D3) 125 MCG (5000 UT) CAPS   CINNAMON PO   Continuous Blood Gluc Sensor (FREESTYLE LIBRE 3 SENSOR) MISC   CUVITRU 1 GM/5ML SOLN   CUVITRU 10 GM/50ML SOLN   CUVITRU 2 GM/10ML SOLN   CUVITRU 4 GM/20ML SOLN   diltiazem (CARDIZEM CD) 180 MG 24 hr capsule   diltiazem (CARDIZEM CD) 300 MG 24 hr capsule   divalproex (DEPAKOTE ER) 500 MG 24 hr tablet   ELDERBERRY PO   EPINEPHrine 0.3 mg/0.3 mL IJ SOAJ injection   ferrous sulfate 324 MG TBEC   fluticasone furoate-vilanterol (BREO ELLIPTA) 100-25 MCG/ACT AEPB   gabapentin (NEURONTIN) 600 MG tablet   hydrOXYzine (ATARAX) 25 MG tablet   Immune Globulin, Human, (CUVITRU [place])   Insulin Pen Needle (PEN NEEDLES 3/16") 31G X 5 MM MISC   LANTUS SOLOSTAR 100 UNIT/ML Solostar Pen   levalbuterol (XOPENEX HFA) 45 MCG/ACT inhaler   levalbuterol (XOPENEX) 1.25 MG/3ML nebulizer solution   levothyroxine (SYNTHROID, LEVOTHROID) 112 MCG tablet   linaclotide (LINZESS) 145 MCG CAPS capsule   losartan (COZAAR) 50 MG tablet   MELATONIN GUMMIES PO   metFORMIN (GLUCOPHAGE) 500 MG tablet   metoCLOPramide (REGLAN) 5 MG tablet   montelukast (SINGULAIR) 10 MG tablet    Multiple Vitamins-Minerals (MULTIVITAMIN WITH MINERALS) tablet   Nebulizer MISC   olmesartan (BENICAR) 40 MG tablet   omeprazole (PRILOSEC) 20 MG capsule   ondansetron (ZOFRAN-ODT) 8 MG disintegrating tablet   ONETOUCH ULTRA test strip   oxyCODONE-acetaminophen (PERCOCET) 5-325 MG tablet   polyethylene glycol (MIRALAX / GLYCOLAX) 17 g packet   Potassium Chloride ER 20 MEQ TBCR   Spacer/Aero-Holding Chambers (AEROCHAMBER PLUS) inhaler   topiramate (TOPAMAX) 100 MG tablet   topiramate (TOPAMAX) 50 MG tablet   torsemide (DEMADEX) 20 MG tablet   triamcinolone cream (KENALOG) 0.1 %   TRULICITY 3 XN/1.7GY SOPN   TURMERIC PO   vitamin B-12 (CYANOCOBALAMIN) 1000 MCG tablet   vitamin C (ASCORBIC ACID) 500 MG tablet   History:   Past Medical History:  Diagnosis Date   Anemia    Aortic atherosclerosis (HCC)    Asthma    Atrial fibrillation and flutter (HCC)    a.) CHA2DS2VASc = 6 (sex, HTN, TIA x2, vascular disease history, T2DM);  b.) DCCV ~ 2009; c.) DCCV 08/29/2018 (200J x1 --> SR with 1st degree AVB); d.) rate/rhythm maintained on oral diltiazem + bisoprolol; chronically anticoagulated with apixaban   Bipolar disorder (HCC)    Carpal tunnel syndrome of right wrist    CHB (complete heart block) (HCC)    a.) s/p St. Jude dual chamber PPM placement   CHF (congestive heart failure) (HCC)    Complication of anesthesia  a.) "allergic reaction" to perioperative analgesics + anesthetic medications in [place]; unsure of combination; not able to recount reactions/events --> states "I ended up in the ICU for 5 days"   Constipation    a.) on linaclotide   DDD (degenerative disc disease), thoracic    GERD (gastroesophageal reflux disease)    H/O congenital atrial septal defect (ASD) repair    Hallux valgus of right foot    Hammertoe of second toe of right foot    History of 2019 novel coronavirus disease (COVID-19)    a.) 10/2020; b.) 05/2021   HLD (hyperlipidemia)    HTN (hypertension)     Hypogammaglobulinemia ([place])    a.) on Curitru   Hypothyroid    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 Jude dual chamber device; placed for symptomatic bradycardia secondary to intermittent CHB   PTSD (post-traumatic stress disorder)    Pulmonary nodules    Recurrent sinusitis    Short-term memory loss    Sleep difficulties    a.) takes melatonin   Symptomatic bradycardia    a.) s/p St. Jude dual chamber PPM placement   Syncope    TIA (transient ischemic attack)    x3-last one in 2015   Tremors of nervous system    Type 2 diabetes mellitus ([place])    Past Surgical History:  Procedure Laterality Date   ASD REPAIR  1968   BIOPSY  01/26/2021   Procedure: BIOPSY;  Surgeon: [name], DO;  [facility];  Service: Endoscopy;;  gastric   CARDIOVERSION  2009   CARDIOVERSION N/A 08/29/2018   Procedure: CARDIOVERSION;  Surgeon: Arnoldo Lenis, MD;  [facility];  Service: Endoscopy;  Laterality: N/A;   CARPAL TUNNEL RELEASE Bilateral    [place]   COLONOSCOPY WITH PROPOFOL N/A 01/26/2021   Procedure: COLONOSCOPY WITH PROPOFOL;  Surgeon: [name], DO;  [facility]: [facility];  Service: Endoscopy;  Laterality: N/A;  am appt, diabetic   ESOPHAGOGASTRODUODENOSCOPY (EGD) WITH PROPOFOL N/A 01/26/2021   Procedure: ESOPHAGOGASTRODUODENOSCOPY (EGD) WITH PROPOFOL;  Surgeon: [name], DO;  [facility];  Service: Endoscopy;  Laterality: N/A;   LEFT HEART CATH AND CORONARY ANGIOGRAPHY N/A  08/30/2019   Procedure: LEFT HEART CATH AND CORONARY ANGIOGRAPHY;  Surgeon: [name], MD;  Location: [facility];  Service: Cardiovascular;  Laterality: N/A;   NASAL SINUS SURGERY  2017   PACEMAKER IMPLANT N/A 03/03/2022   Procedure: PACEMAKER IMPLANT;  Surgeon: [name], MD;  Location: [facility];  Service: Cardiovascular;  Laterality: N/A;   POLYPECTOMY  01/26/2021   Procedure: POLYPECTOMY;  Surgeon: [name], DO;  [facility];  Service: Endoscopy;;  colon   Family History  Problem Relation Age of Onset   Hypertension Mother    Stroke Mother    Kidney disease Mother    Heart attack Mother    Alcohol abuse  Mother    Other Father        ?colon cancer   Kidney disease Sister    Hypertension Sister    Bipolar disorder Sister    Atrial fibrillation Sister    Heart disease Brother    Prostate cancer Brother    Thyroid disease Daughter    Hashimoto's thyroiditis Daughter    Celiac disease Daughter    Allergic rhinitis Daughter    Stroke Maternal Aunt    Heart disease Maternal Aunt    Asthma Neg Hx    Social History   Tobacco Use   Smoking status: Former    Packs/day: 1.00    Years: 28.00    Total pack years: 28.00    Types: Cigarettes    Start date: 34    Quit date: 2008    Years since quitting: 15.8   Smokeless tobacco: Never  Vaping Use   Vaping Use: Never used  Substance Use Topics   Alcohol use: Not Currently   Drug use: Not Currently    Pertinent Clinical Results:  LABS: Labs reviewed: Acceptable for surgery.  Lab Results  Component Value Date   WBC 5.8 07/22/2022   HGB 14.2 07/22/2022   HCT 40.2 07/22/2022   MCV 93.3 07/22/2022   PLT 167 07/22/2022   Lab Results  Component Value Date   NA 139 07/22/2022   K 4.0 07/22/2022   CO2 19 (L) 07/22/2022   GLUCOSE 112 (H) 07/22/2022   BUN 23 (H) 07/22/2022   CREATININE 0.67 07/22/2022   CALCIUM 9.2 07/22/2022   EGFR 100 07/11/2022   GFRNONAA >60  07/22/2022   Lab Results  Component Value Date   HGBA1C 7.7 07/19/2022    ECG: Date: 07/22/2022 Time ECG obtained: 1757 PM Rate: 63 bpm Rhythm:  AV dual paced rhythm with prolonged AV conduction Axis (leads I and aVF): Normal Intervals: PR 232 ms. QRS 116 ms. QTc 454 ms. ST segment and T wave changes: No evidence of acute ST segment elevation or depression Comparison: Previous tracing on 03/18/2022 showed a/flutter. Evidence of an age undetermined septal infarct.    IMAGING / PROCEDURES: DIAGNOSTIC RADIOGRAPHS OF CHEST 2 VIEWS performed on 07/22/2022 Left chest wall cardiac pacer is seen with leads overlying the right atrium and right ventricle.  Cardiac silhouette and mediastinal contours are within normal limits.  Moderate calcification within the aortic arch.  Mild bilateral lower lung chronic interstitial thickening.  No pleural effusion pneumothorax.  Mild to moderate multilevel degenerative disc changes of the thoracic spine.  CT CHEST WO CONTRAST performed on 03/18/2022 No acute findings in the chest. Specifically, no findings to explain the patient's history of chest pain. Gas in the anterior subcutaneous fat of the upper abdominal wall is presumably related to injections. Aortic atherosclerosis   TRANSTHORACIC ECHOCARDIOGRAM performed on 02/08/2022 Left ventricular ejection fraction, by estimation, is 60 to 65%. The left ventricle has normal function. The left ventricle has no regional wall motion abnormalities. There is mild left ventricular hypertrophy. Left ventricular diastolic parameters are indeterminate.  Right ventricular systolic function is normal. The right ventricular size is normal. Tricuspid regurgitation signal is inadequate for assessing PA pressure.  Left atrial size was mildly dilated.  The mitral valve is abnormal. No evidence of mitral valve regurgitation. Mild mitral stenosis.  The aortic valve has an indeterminant number of cusps. There is mild  calcification of the aortic valve. There is mild thickening of the aortic valve. Aortic valve regurgitation is not visualized. No aortic  stenosis is present.  The inferior vena cava is normal in size with greater than 50% respiratory variability, suggesting right atrial pressure of 3 mmHg.   LONG TERM CARDIAC EVENT MONITOR STUDY performed on  01/12/2022 7 day monitor 100% afib burden during study. Min HR 35, Max HR 117, Avg HR 61 6 pauses occurred, longest lasting 3.6 seconds which occurred at 543 AM. Pauses in general were nocturnal, no significant day time pauses. Reported symptoms corresponded with atrial fibrillation  MR FOOT RIGHT WO CONTRAST performed on 10/18/2021 Mild hammertoe morphology of the second toe, without definitive plantar plate tear at the second MTP joint. Intermediate signal within the lateral proper collateral ligament of the second MTP joint consistent with chronic low-grade injury/degeneration. Probable degenerative plantar plate tear of the first MTP joint.  Absent medial sesamoid of the first MTP with capsular thickening of the plantar great toe MTP medially. Scattered mild interphalangeal joint degenerative arthritis. No evidence of acute fracture.  No acute tendon tear or significant tenosynovitis.  LEFT HEART CATHETERIZATION AND CORONARY ANGIOGRAPHY performed on 08/30/2019 Normal left ventricular systolic function Mildly elevated LVEDP = 24 mmHg Aortic atherosclerosis/calcification with no evidence of stenosis No angiographically apparent CAD Recommendations: continue risk factor modification  Impression and Plan:  Laura [name] has been referred for pre-anesthesia review and clearance prior to her undergoing the planned anesthetic and procedural courses. Available labs, pertinent testing, and imaging results were personally reviewed by me. This patient has been appropriately cleared by cardiology with an overall ACCEPTABLE risk of significant perioperative  cardiovascular complications. Completed perioperative prescription for cardiac device management documentation completed by primary cardiology team and placed on patient's chart for review by the surgical/anesthetic team on the day of her procedure. Electrophysiology indicating that procedure should not interfere with planned surgical procedure. Beyond normal perioperative cardiovascular monitoring, there are no recommendations from electrophysiology team that prompt further discussion/recommendations from industry representative.   Based on clinical review performed today (07/27/22), barring any significant acute changes in the patient's overall condition, it is anticipated that she will be able to proceed with the planned surgical intervention. Any acute changes in clinical condition may necessitate her procedure being postponed and/or cancelled. Patient will meet with anesthesia team (MD and/or CRNA) on the day of her procedure for preoperative evaluation/assessment. Questions regarding anesthetic course will be fielded at that time.   Pre-surgical instructions were reviewed with the patient during her PAT appointment and questions were fielded by PAT clinical staff. Patient was advised that if any questions or concerns arise prior to her procedure then she should return a call to PAT and/or her surgeon's office to discuss.  Honor Loh, MSN, APRN, FNP-C, CEN [facility]  Peri-operative Services Nurse Practitioner Phone: [phone] Fax: [phone] 07/27/22 4:08 PM  NOTE: This note has been prepared using Dragon dictation software. Despite my best ability to proofread, there is always the potential that unintentional transcriptional errors may still occur from this process.

## 2022-07-27 NOTE — Patient Instructions (Signed)
                                     Advice for Weight Management  -For most of us the best way to lose weight is by diet management. Generally speaking, diet management means consuming less calories intentionally which over time brings about progressive weight loss.  This can be achieved more effectively by avoiding ultra processed carbohydrates, processed meats, unhealthy fats.    It is critically important to know your numbers: how much calorie you are consuming and how much calorie you need. More importantly, our carbohydrates sources should be unprocessed naturally occurring  complex starch food items.  It is always important to balance nutrition also by  appropriate intake of proteins (mainly plant-based), healthy fats/oils, plenty of fruits and vegetables.   -The American College of Lifestyle Medicine (ACL M) recommends nutrition derived mostly from Whole Food, Plant Predominant Sources example an apple instead of applesauce or apple pie. Eat Plenty of vegetables, Mushrooms, fruits, Legumes, Whole Grains, Nuts, seeds in lieu of processed meats, processed snacks/pastries red meat, poultry, eggs.  Use only water or unsweetened tea for hydration.  The College also recommends the need to stay away from risky substances including alcohol, smoking; obtaining 7-9 hours of restorative sleep, at least 150 minutes of moderate intensity exercise weekly, importance of healthy social connections, and being mindful of stress and seek help when it is overwhelming.    -Sticking to a routine mealtime to eat 3 meals a day and avoiding unnecessary snacks is shown to have a big role in weight control. Under normal circumstances, the only time we burn stored energy is when we are hungry, so allow  some hunger to take place- hunger means no food between appropriate meal times, only water.  It is not advisable to starve.   -It is better to avoid simple carbohydrates including:  Cakes, Sweet Desserts, Ice Cream, Soda (diet and regular), Sweet Tea, Candies, Chips, Cookies, Store Bought Juices, Alcohol in Excess of  1-2 drinks a day, Lemonade,  Artificial Sweeteners, Doughnuts, Coffee Creamers, "Sugar-free" Products, etc, etc.  This is not a complete list.....    -Consulting with certified diabetes educators is proven to provide you with the most accurate and current information on diet.  Also, you may be  interested in discussing diet options/exchanges , we can schedule a visit with Laura [name], RDN, CDE for individualized nutrition education.  -Exercise: If you are able: 30 -60 minutes a day ,4 days a week, or 150 minutes of moderate intensity exercise weekly.    The longer the better if tolerated.  Combine stretch, strength, and aerobic activities.  If you were told in the past that you have high risk for cardiovascular diseases, or if you are currently symptomatic, you may seek evaluation by your heart doctor prior to initiating moderate to intense exercise programs.                                  Additional Care Considerations for Diabetes/Prediabetes   -Diabetes  is a chronic disease.  The most important care consideration is regular follow-up with your diabetes care provider with the goal being avoiding or delaying its complications and to take advantage of advances in medications and technology.  If appropriate actions are taken early enough, type 2 diabetes can even be   reversed.  Seek information from the right source.  - Whole Food, Plant Predominant Nutrition is highly recommended: Eat Plenty of vegetables, Mushrooms, fruits, Legumes, Whole Grains, Nuts, seeds in lieu of processed meats, processed snacks/pastries red meat, poultry, eggs as recommended by American College of  Lifestyle Medicine (ACLM).  -Type 2 diabetes is known to coexist with other important comorbidities such as high blood pressure and high cholesterol.  It is critical to control not only the  diabetes but also the high blood pressure and high cholesterol to minimize and delay the risk of complications including coronary artery disease, stroke, amputations, blindness, etc.  The good news is that this diet recommendation for type 2 diabetes is also very helpful for managing high cholesterol and high blood blood pressure.  - Studies showed that people with diabetes will benefit from a class of medications known as ACE inhibitors and statins.  Unless there are specific reasons not to be on these medications, the standard of care is to consider getting one from these groups of medications at an optimal doses.  These medications are generally considered safe and proven to help protect the heart and the kidneys.    - People with diabetes are encouraged to initiate and maintain regular follow-up with eye doctors, foot doctors, dentists , and if necessary heart and kidney doctors.     - It is highly recommended that people with diabetes quit smoking or stay away from smoking, and get yearly  flu vaccine and pneumonia vaccine at least every 5 years.  See above for additional recommendations on exercise, sleep, stress management , and healthy social connections.      

## 2022-07-27 NOTE — Patient Instructions (Signed)
Goals Put only 1 tsp of honey in 16-20 oz of vitamin water Eat 30 g CHO at each meal and protein. Increase low carb vegetables. Don't skip meals Get A1C down to 7%

## 2022-07-27 NOTE — Progress Notes (Signed)
Assessment/Plan:  neuropathy Work-up is in progress, this started 9 years ago, worse over the last year.  The patient describes numbness and tingling at the tip of her hands and the tip of her toes, and a sensation of band like pressure in the left transmetatarsal area.  The numbness is without radiation but causing some balance issues.  The patient is already on gabapentin 600 mg 3 times daily for diabetes.  In the past, she had an EMG-NCS when in [place], having been told that she had neuropathy due to diabetes.  Lately, this has become bothersome, for which she needs further work-up Of note, as mentioned above, the patient already has a history of prior hand paresthesias, with median neuropathy at the wrist, she is now s/p surgery with some relief.  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 Franky Macho. 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 Franky Macho).   Repeat NCS/EMG Check TSH, B12, B1, sed rate, CRP, copper, SPEP with immunofixation, folate, ANA Patient to return when the results become available  2.  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 She is on both, psychiatric tremor inducing meds (Depakote) as well as asthma meds. In addition she has anxiety which may contribute to the signs and symptoms Exam essentially unchanged from prior. No other Parkinsonian signs.   Continue topiramate 100 mg bid.   Follow with Dr. Arbutus Leas   3.  Cervical spinal stenosis             -following with Dr. Franky Macho, not a candidate for surgery as per MD     4. Follow up with her PCP on her other medical issues    Subjective:   started 9 years ago, worse over the last year.  The patient describes numbness and tingling at the tip of her hands and the tip of her toes, and a  sensation of band like pressure in the left transmetatarsal area.  The numbness is without radiation but causing some balance issues.  The patient is already on gabapentin 600 mg 3 times daily for diabetes.  In the past, she had an EMG-NCS when in [place], having been told that she had neuropathy due to diabetes.  Lately, this has become bothersome. The patient already has a history of prior hand paresthesias, with median neuropathy at the wrist, she is now s/p surgery with some relief.  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 Franky Macho. 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 Franky Macho).    ALLERGIES:   Allergies  Allergen Reactions   Ativan [Lorazepam] Hives   Phenergan [Promethazine Hcl] Hives    CURRENT MEDICATIONS:  Facility-Administered Encounter Medications as of 07/27/2022  Medication   Benralizumab SOSY 30 mg   Outpatient Encounter Medications as of 07/27/2022  Medication Sig   acetaminophen (TYLENOL) 325 MG tablet Take 2 tablets (650 mg total) by mouth every 6 (six) hours as needed for mild pain, fever or headache.   albuterol (PROVENTIL) (2.5 MG/3ML) 0.083% nebulizer solution Take 3 mLs (2.5 mg total) by nebulization every 4 (four) hours as  needed for wheezing or shortness of breath.   albuterol (VENTOLIN HFA) 108 (90 Base) MCG/ACT inhaler INHALE 2 PUFFS INTO THE LUNGS EVERY 6 HOURS AS NEEDED FOR WHEEZING OR SHORTNESS OF BREATH   apixaban (ELIQUIS) 5 MG TABS tablet TAKE ONE TABLET BY MOUTH TWICE DAILY $RemoveBef'@9AM'yEtJChmVsV$  & 5PM (Patient taking differently: Take 5 mg by mouth 2 (two) times daily.)   atorvastatin (LIPITOR) 10 MG tablet Take 10 mg by mouth at bedtime.   BD PEN NEEDLE NANO 2ND GEN 32G X 4 MM MISC 1 each by Other route in the morning, at noon, in  the evening, and at bedtime.   Benralizumab (FASENRA PEN) 30 MG/ML SOAJ Inject 1 mL (30 mg total) into the skin every 28 (twenty-eight) days. Then every 8 weeks   bisoprolol (ZEBETA) 10 MG tablet Take 1 tablet (10 mg total) by mouth daily. (Patient taking differently: Take 10 mg by mouth every morning.)   blood glucose meter kit and supplies 1 each by Other route 4 (four) times daily. Dispense based on patient and insurance preference. Use up to four times daily as directed. (FOR ICD-10 E10.9, E11.9).   Cholecalciferol (VITAMIN D3) 125 MCG (5000 UT) CAPS Take 1 capsule (5,000 Units total) by mouth daily.   CINNAMON PO Take 1 tablet by mouth daily at 6 (six) AM.   Continuous Blood Gluc Sensor (FREESTYLE LIBRE 3 SENSOR) MISC 1 Piece by Does not apply route every 14 (fourteen) days. Place 1 sensor on the skin every 14 days. Use to check glucose continuously   CUVITRU 1 GM/5ML SOLN Inject into the skin.   CUVITRU 10 GM/50ML SOLN Inject 25 mg into the skin every 14 (fourteen) days.   CUVITRU 2 GM/10ML SOLN Inject into the skin.   CUVITRU 4 GM/20ML SOLN Inject into the skin.   diltiazem (CARDIZEM CD) 180 MG 24 hr capsule Take 180 mg by mouth daily.   diltiazem (CARDIZEM CD) 300 MG 24 hr capsule Take 1 capsule (300 mg total) by mouth daily. (Patient taking differently: Take 300 mg by mouth every morning.)   divalproex (DEPAKOTE ER) 500 MG 24 hr tablet Take 1 tablet (500 mg total) by mouth daily AND 2 tablets (1,000 mg total) at bedtime. (Patient taking differently: Take 1 tablet (500 mg total) in AM  AND 2 tablets (1,500 mg total) at bedtime.)   ELDERBERRY PO Take 4 g by mouth daily. Gummies 2g each   EPINEPHrine 0.3 mg/0.3 mL IJ SOAJ injection Inject 0.3 mg into the muscle once as needed for anaphylaxis.   ferrous sulfate 324 MG TBEC Take 324 mg by mouth every evening.   fluticasone furoate-vilanterol (BREO ELLIPTA) 100-25 MCG/ACT AEPB INHALE 1 PUFF INTO LUNGS DAILY (BULK) (Patient taking differently: 1  puff every morning.)   gabapentin (NEURONTIN) 600 MG tablet Take 600 mg by mouth 3 (three) times daily.   hydrOXYzine (ATARAX) 25 MG tablet Take 25 mg by mouth every 8 (eight) hours as needed.   Immune Globulin, Human, (CUVITRU [place]) Inject 27 mg into the skin every 14 (fourteen) days.   Insulin Pen Needle (PEN NEEDLES 3/16") 31G X 5 MM MISC Use as directed with insulin pen   LANTUS SOLOSTAR 100 UNIT/ML Solostar Pen INJECT 70 UNITS SUBCUTANEOUSLY AT BEDTIME (BULK) (Patient taking differently: 50 Units at bedtime.)   levalbuterol (XOPENEX HFA) 45 MCG/ACT inhaler Inhale 2 puffs into the lungs every 6 (six) hours as needed.   levalbuterol (XOPENEX) 1.25 MG/3ML nebulizer solution Inhale 1.25 mg into the lungs daily  as needed (asthma).   levothyroxine (SYNTHROID, LEVOTHROID) 112 MCG tablet Take 112 mcg by mouth daily before breakfast.   linaclotide (LINZESS) 145 MCG CAPS capsule Take 1 capsule (145 mcg total) by mouth daily before breakfast. Office visit needed before further refills   losartan (COZAAR) 50 MG tablet Take 50 mg by mouth every morning.   MELATONIN GUMMIES PO Take 10 mg by mouth at bedtime.   metFORMIN (GLUCOPHAGE) 500 MG tablet Take 500 mg by mouth 2 (two) times daily.   metoCLOPramide (REGLAN) 5 MG tablet Take 5 mg by mouth 3 (three) times daily.   montelukast (SINGULAIR) 10 MG tablet Take 10 mg by mouth at bedtime.   Multiple Vitamins-Minerals (MULTIVITAMIN WITH MINERALS) tablet Take 1 tablet by mouth daily. Woman   Nebulizer MISC Nebulizer tubing kit   olmesartan (BENICAR) 40 MG tablet    omeprazole (PRILOSEC) 20 MG capsule TAKE 1 CAPSULE(20 MG) BY MOUTH TWICE DAILY BEFORE A MEAL (Patient taking differently: 20 mg 2 (two) times daily before a meal.)   ondansetron (ZOFRAN-ODT) 8 MG disintegrating tablet Take 8 mg by mouth daily as needed for vomiting or nausea.   ONETOUCH ULTRA test strip 1 each by Other route in the morning, at noon, in the evening, and at bedtime.    oxyCODONE-acetaminophen (PERCOCET) 5-325 MG tablet Take 1 tablet by mouth every 6 (six) hours as needed.   polyethylene glycol (MIRALAX / GLYCOLAX) 17 g packet Take 17 g by mouth daily as needed. (Patient taking differently: Take 17 g by mouth daily as needed for moderate constipation or severe constipation.)   Potassium Chloride ER 20 MEQ TBCR Take 20 mEq by mouth daily.   Spacer/Aero-Holding Chambers (AEROCHAMBER PLUS) inhaler 1 each by Other route as needed for other. Use as instructed   topiramate (TOPAMAX) 100 MG tablet Take 1 tablet (100 mg total) by mouth 2 (two) times daily. (Patient taking differently: Take 100-150 mg by mouth 2 (two) times daily. 150 mg in am and 100 mg at night)   topiramate (TOPAMAX) 50 MG tablet Take 1 tablet (50 mg total) by mouth daily.   torsemide (DEMADEX) 20 MG tablet Take 1 tablet daily for fluid gain of 3 lb overnight or 5 lbs in a week. (Patient taking differently: Take 20 mg by mouth as needed. Take 1 tablet daily for fluid gain of 3 lb overnight or 5 lbs in a week.)   triamcinolone cream (KENALOG) 0.1 % Apply 1 application. topically daily as needed (foot).   TRULICITY 3 BT/5.1VO SOPN INJECT $RemoveBef'3MG'MdDogQHnwf$  AS DIRECTED ONCE A WEEK (BULK)   TURMERIC PO Take 1 tablet by mouth daily at 6 (six) AM.   vitamin B-12 (CYANOCOBALAMIN) 1000 MCG tablet Take 1,000 mcg by mouth daily.   vitamin C (ASCORBIC ACID) 500 MG tablet Take 500 mg by mouth daily. Power C immune support    Objective:   PHYSICAL EXAMINATION:    VITALS:   Vitals:   07/27/22 1506  BP: (!) 192/94  Pulse: 77  Resp: 18  SpO2: 95%  Weight: 218 lb (98.9 kg)  Height: $Remove'5\' 4"'XDRYBYc$  (1.626 m)     GEN:  The patient appears stated age and is in NAD. HEENT:  Normocephalic, atraumatic.  The mucous membranes are moist. The superficial temporal arteries are without ropiness or tenderness. CV:  RRR Lungs:  CTAB Neck/HEME:  There are no carotid bruits bilaterally.  Neurological examination:  Orientation: The patient  is alert and oriented x3. Cranial nerves: There is good facial symmetry  with no facial hypomimia. The speech is fluent and clear. Soft palate rises symmetrically and there is no tongue deviation. Hearing is intact to conversational tone. Sensation: Sensation is intact bilateral to light touch throughout, temperature and vibration normal, DTR 1/4 at the feet, and 2 out of 4 on the hands. Motor: Strength is at least antigravity x4.  Movement examination: Tone: There is normal tone in the extremities Abnormal movements:  Mild Postural tremor on the R, no tremor at rest. She is able to draw Archimedes spirals without significant difficulty,  Coordination:  There is no decremation with RAM's, good FNF. No dysdiachokinesia or dysmetria Gait and Station: The patient has no difficulty arising out of a deep-seated chair without the use of the hands. The patient's stride length is normal.  The patient has abnormal  pull test.        Total time spent on today's visit was 23 minutes, including both face-to-face time and nonface-to-face time.  Time included that spent on review of records (prior notes available to me/labs/imaging if pertinent), discussing treatment and goals, answering patient's questions and coordinating care.

## 2022-07-27 NOTE — Patient Instructions (Addendum)
NCS/EMG at leg and hand for neuropathy Continue gabapentin 600 mg 3 times a day   Labs  today  Follow up after the nerve conduction study results

## 2022-07-27 NOTE — Progress Notes (Unsigned)
Medical Nutrition Therapy  Appointment Start time:  6389  Appointment End time: [place]  Follow up DM Type 2 Saw [name] today. Increased her Lantus to 50 units due to elevated H7D 4.2%  Trulicity same at 3.0. Metformin 1000 mg BID. Changes made: Cut out a lot of sugary foods and watching more portions. She is really watching what she is eating better. Getting ready for foot surgery on Friday. Discussed at length the importance of behavior changes for life to reduce risk of complications post surgery and other complications. Stressed need for more plant based foods for maximum health.  Anthropometrics  Wt Readings from Last 3 Encounters:  07/27/22 218 lb 3.2 oz (99 kg)  07/22/22 218 lb (98.9 kg)  07/11/22 219 lb (99.3 kg)   Ht Readings from Last 3 Encounters:  07/27/22 '5\' 4"'$  (1.626 m)  07/22/22 '5\' 4"'$  (1.626 m)  07/11/22 '5\' 4"'$  (1.626 m)   There is no height or weight on file to calculate BMI. '@BMIFA'$ @ Facility age limit for growth %iles is 20 years. Facility age limit for growth %iles is 20 years.   FBS: 128 mg/dl.  Range 124-128 mg/dl..  Clinical Medical Hx: CHF and DM,  Medications: Lantus 50 units a day now, Trulicity weekly and Metformin.  Labs:  Lab Results  Component Value Date   HGBA1C 7.7 07/19/2022      Latest Ref Rng & Units 07/22/2022    6:19 PM 07/19/2022   12:00 AM 07/11/2022    9:28 AM  CMP  Glucose 70 - 99 mg/dL 112   140   BUN 6 - 20 mg/dL '23  15     15   '$ Creatinine 0.44 - 1.00 mg/dL 0.67  0.8     0.66   Sodium 135 - 145 mmol/L 139  141     139   Potassium 3.5 - 5.1 mmol/L 4.0  4.6     4.6   Chloride 98 - 111 mmol/L 112  106     105   CO2 22 - 32 mmol/L '19  19     18   '$ Calcium 8.9 - 10.3 mg/dL 9.2  9.7     8.8   Total Protein 6.0 - 8.5 g/dL   6.8   Total Bilirubin 0.0 - 1.2 mg/dL   0.2   Alkaline Phos 25 - 125  93     89   AST 13 - 35  13     11   ALT 7 - 35 U/L  14     10      This result is from an external source.    Notable Signs/Symptoms:    Lifestyle & Dietary  Lives with her sister.   Estimated daily fluid intake:  64 oz   Sleep: varies  Stress / self-care: stress eater Current average weekly physical activity: ADL  B) Banana L) Leftover or lowcarb wraps with chicken salad. D) Hamburger with sweet potato, broccoli   Estimated Energy Needs Calories: 1200 Carbohydrate: 135g Protein: 90g Fat: 33g   NUTRITION DIAGNOSIS  NB-1.1 Food and nutrition-related knowledge deficit As related to Diabetes Type 2.  As evidenced by A1C 7.7%.   NUTRITION INTERVENTION  Nutrition education (E-1) on the following topics:  Lifestyle Medicine - Whole Food, Plant Predominant Nutrition is highly recommended: Eat Plenty of vegetables, Mushrooms, fruits, Legumes, Whole Grains, Nuts, seeds in lieu of processed meats, processed snacks/pastries red meat, poultry, eggs.    -It is better  to avoid simple carbohydrates including: Cakes, Sweet Desserts, Ice Cream, Soda (diet and regular), Sweet Tea, Candies, Chips, Cookies, Store Bought Juices, Alcohol in Excess of  1-2 drinks a day, Lemonade,  Artificial Sweeteners, Doughnuts, Coffee Creamers, "Sugar-free" Products, etc, etc.  This is not a complete list.....  Exercise: If you are able: 30 -60 minutes a day ,4 days a week, or 150 minutes a week.  The longer the better.  Combine stretch, strength, and aerobic activities.  If you were told in the past that you have high risk for cardiovascular diseases, you may seek evaluation by your heart doctor prior to initiating moderate to intense exercise programs.  Handouts Provided Include  .Nutrition Lifestyle handout  Learning Style & Readiness for Change Teaching method utilized: Visual & Auditory  Demonstrated degree of understanding via: Teach Back  Barriers to learning/adherence to lifestyle change: none  Goals Established by Pt   Try to get to eating 3 meals per day Get a face and face visit with  therapist. Try not to eat late at night after 7 pm.  MONITORING & EVALUATION Dietary intake, weekly physical activity, and blood sugars in 3 month. Increase Lantus to 50 units at night per [name]..  Next Steps  Get back on track with meal planning and eating meals on time-more plant based foods.

## 2022-07-27 NOTE — Progress Notes (Signed)
07/27/2022, 3:30 PM   Endocrinology follow-up note   Subjective:    Patient ID: Laura [name], female    DOB: 19-Oct-1960.  Ariany Kesselman was seen since 2019 for management of diabetes, also seen during her last visit in relation to her steroid exposure to rule out adrenal insufficiency.  PMD: [name], MD.   Past Medical History:  Diagnosis Date   Anemia    Aortic atherosclerosis ([place])    Asthma    Atrial fibrillation and flutter ([place])    a.) CHA2DS2VASc = 6 (sex, HTN, TIA x2, vascular disease history, T2DM);  b.) DCCV ~ 2009; c.) DCCV 08/29/2018 (200J x1 --> SR with 1st degree AVB); d.) rate/rhythm maintained on oral diltiazem + bisoprolol; chronically anticoagulated with apixaban   Bipolar disorder ([place])    Carpal tunnel syndrome of right wrist    CHB (complete heart block) ([place])    a.) s/p St. Jude dual chamber PPM placement   CHF (congestive heart failure) ([place])    Complication of anesthesia    a.) "allergic reaction" to perioperative analgesics + anesthetic medications in [place]; unsure of combination; not able to recount reactions/events --> states "I ended up in the ICU for 5 days"   Constipation    a.) on linaclotide   DDD (degenerative disc disease), thoracic    GERD (gastroesophageal reflux disease)    H/O congenital atrial septal defect (ASD) repair    Hallux valgus of right foot    Hammertoe of second toe of right foot    History of 2019 novel coronavirus disease (COVID-19)    a.) 10/2020; b.) 05/2021   HLD (hyperlipidemia)    HTN (hypertension)    Hypogammaglobulinemia ([place])    a.) on Curitru   Hypothyroid    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 Jude dual chamber device; placed for symptomatic bradycardia secondary to intermittent CHB   PTSD (post-traumatic stress disorder)    Pulmonary nodules    Recurrent sinusitis    Short-term memory loss    Sleep difficulties    a.) takes melatonin   Symptomatic bradycardia    a.) s/p St. Jude dual chamber PPM placement   Syncope    TIA (transient ischemic attack)    x3-last one in 2015   Tremors of nervous system    Type 2 diabetes mellitus ([place])    Past Surgical History:  Procedure Laterality Date   ASD REPAIR  1968   BIOPSY  01/26/2021   Procedure: BIOPSY;  Surgeon: [name], DO;  [facility];  Service: Endoscopy;;  gastric   CARDIOVERSION  2009   CARDIOVERSION N/A  08/29/2018   Procedure: CARDIOVERSION;  Surgeon: Arnoldo Lenis, MD;  [facility];  Service: Endoscopy;  Laterality: N/A;   CARPAL TUNNEL RELEASE Bilateral    [place]   COLONOSCOPY WITH PROPOFOL N/A 01/26/2021   Procedure: COLONOSCOPY WITH PROPOFOL;  Surgeon: [name], DO;  [facility];  Service: Endoscopy;  Laterality: N/A;  am appt, diabetic   ESOPHAGOGASTRODUODENOSCOPY (EGD) WITH PROPOFOL N/A 01/26/2021   Procedure: ESOPHAGOGASTRODUODENOSCOPY (EGD) WITH PROPOFOL;  Surgeon: [name], DO;  [facility];  Service: Endoscopy;  Laterality: N/A;   LEFT HEART CATH AND CORONARY ANGIOGRAPHY N/A 08/30/2019   Procedure: LEFT HEART CATH AND CORONARY ANGIOGRAPHY;  Surgeon: [name], MD;  Location: [facility];  Service: Cardiovascular;  Laterality: N/A;   NASAL SINUS SURGERY  2017   PACEMAKER IMPLANT N/A 03/03/2022   Procedure: PACEMAKER IMPLANT;  Surgeon: [name], MD;  Location: [facility];   Service: Cardiovascular;  Laterality: N/A;   POLYPECTOMY  01/26/2021   Procedure: POLYPECTOMY;  Surgeon: [name], DO;  [facility];  Service: Endoscopy;;  colon   Social History   Socioeconomic History   Marital status: Widowed    Spouse name: Not on file   Number of children: 3   Years of education: 10   Highest education level: Not on file  Occupational History   Not on file  Tobacco Use   Smoking status: Former    Packs/day: 1.00    Years: 28.00    Total pack years: 28.00    Types: Cigarettes    Start date: 92    Quit date: 2008    Years since quitting: 15.8   Smokeless tobacco: Never  Vaping Use   Vaping Use: Never used  Substance and Sexual Activity   Alcohol use: Not Currently   Drug use: Not Currently   Sexual activity: Not Currently    Birth control/protection: Post-menopausal  Other Topics Concern   Not on file  Social History Narrative   Right handed   Drinks caffeine   One story home   Social Determinants of Health   Financial Resource Strain: High Risk (01/06/2022)   Overall Financial Resource Strain (CARDIA)    Difficulty of Paying Living Expenses: Hard  Food Insecurity: No Food Insecurity (01/06/2022)   Hunger Vital Sign    Worried About Running Out of Food in the Last Year: Never true    Ran Out of Food in the Last Year: Never true  Transportation Needs: No Transportation Needs (01/06/2022)   PRAPARE - Hydrologist (Medical): No    Lack of Transportation (Non-Medical): No  Physical Activity: Insufficiently Active (01/06/2022)   Exercise Vital Sign    Days of Exercise per Week: 4 days    Minutes of Exercise per Session: 30 min  Stress: No Stress Concern Present (01/06/2022)   [place]    Feeling of Stress : Only a little  Social Connections: Moderately Integrated (01/06/2022)   Social Connection and Isolation Panel [NHANES]     Frequency of Communication with Friends and Family: More than three times a week    Frequency of Social Gatherings with Friends and Family: More than three times a week    Attends Religious Services: More than 4 times per year    Active Member of Clubs or Organizations: Yes    Attends  Club or Organization Meetings: More than 4 times per year    Marital Status: Widowed   Outpatient Encounter Medications as of 07/27/2022  Medication Sig   acetaminophen (TYLENOL) 325 MG tablet Take 2 tablets (650 mg total) by mouth every 6 (six) hours as needed for mild pain, fever or headache.   albuterol (PROVENTIL) (2.5 MG/3ML) 0.083% nebulizer solution Take 3 mLs (2.5 mg total) by nebulization every 4 (four) hours as needed for wheezing or shortness of breath.   albuterol (VENTOLIN HFA) 108 (90 Base) MCG/ACT inhaler INHALE 2 PUFFS INTO THE LUNGS EVERY 6 HOURS AS NEEDED FOR WHEEZING OR SHORTNESS OF BREATH   apixaban (ELIQUIS) 5 MG TABS tablet TAKE ONE TABLET BY MOUTH TWICE DAILY _0  & 5PM (Patient taking differently: Take 5 mg by mouth 2 (two) times daily.)   atorvastatin (LIPITOR) 10 MG tablet Take 10 mg by mouth at bedtime.   BD PEN NEEDLE NANO 2ND GEN 32G X 4 MM MISC 1 each by Other route in the morning, at noon, in the evening, and at bedtime.   Benralizumab (FASENRA PEN) 30 MG/ML SOAJ Inject 1 mL (30 mg total) into the skin every 28 (twenty-eight) days. Then every 8 weeks   bisoprolol (ZEBETA) 10 MG tablet Take 1 tablet (10 mg total) by mouth daily. (Patient taking differently: Take 10 mg by mouth every morning.)   blood glucose meter kit and supplies 1 each by Other route 4 (four) times daily. Dispense based on patient and insurance preference. Use up to four times daily as directed. (FOR ICD-10 E10.9, E11.9).   Cholecalciferol (VITAMIN D3) 125 MCG (5000 UT) CAPS Take 1 capsule (5,000 Units total) by mouth daily.   CINNAMON PO Take 1 tablet by mouth daily at 6 (six) AM.   Continuous Blood Gluc Sensor  (FREESTYLE LIBRE 3 SENSOR) MISC 1 Piece by Does not apply route every 14 (fourteen) days. Place 1 sensor on the skin every 14 days. Use to check glucose continuously   CUVITRU 1 GM/5ML SOLN Inject into the skin.   CUVITRU 10 GM/50ML SOLN Inject 25 mg into the skin every 14 (fourteen) days.   CUVITRU 2 GM/10ML SOLN Inject into the skin.   CUVITRU 4 GM/20ML SOLN Inject into the skin.   diltiazem (CARDIZEM CD) 180 MG 24 hr capsule Take 180 mg by mouth daily.   diltiazem (CARDIZEM CD) 300 MG 24 hr capsule Take 1 capsule (300 mg total) by mouth daily. (Patient taking differently: Take 300 mg by mouth every morning.)   divalproex (DEPAKOTE ER) 500 MG 24 hr tablet Take 1 tablet (500 mg total) by mouth daily AND 2 tablets (1,000 mg total) at bedtime. (Patient taking differently: Take 1 tablet (500 mg total) in AM  AND 2 tablets (1,500 mg total) at bedtime.)   ELDERBERRY PO Take 4 g by mouth daily. Gummies 2g each   EPINEPHrine 0.3 mg/0.3 mL IJ SOAJ injection Inject 0.3 mg into the muscle once as needed for anaphylaxis.   ferrous sulfate 324 MG TBEC Take 324 mg by mouth every evening.   fluticasone furoate-vilanterol (BREO ELLIPTA) 100-25 MCG/ACT AEPB INHALE 1 PUFF INTO LUNGS DAILY (BULK) (Patient taking differently: 1 puff every morning.)   gabapentin (NEURONTIN) 600 MG tablet Take 600 mg by mouth 3 (three) times daily.   hydrOXYzine (ATARAX) 25 MG tablet Take 25 mg by mouth every 8 (eight) hours as needed.   Immune Globulin, Human, (CUVITRU [place]) Inject 27 mg into the skin every 14 (fourteen) days.  Insulin Pen Needle (PEN NEEDLES 3/16") 31G X 5 MM MISC Use as directed with insulin pen   LANTUS SOLOSTAR 100 UNIT/ML Solostar Pen INJECT 70 UNITS SUBCUTANEOUSLY AT BEDTIME (BULK) (Patient taking differently: 50 Units at bedtime.)   levalbuterol (XOPENEX HFA) 45 MCG/ACT inhaler Inhale 2 puffs into the lungs every 6 (six) hours as needed.   levalbuterol (XOPENEX) 1.25 MG/3ML nebulizer solution Inhale 1.25 mg  into the lungs daily as needed (asthma).   levothyroxine (SYNTHROID, LEVOTHROID) 112 MCG tablet Take 112 mcg by mouth daily before breakfast.   linaclotide (LINZESS) 145 MCG CAPS capsule Take 1 capsule (145 mcg total) by mouth daily before breakfast. Office visit needed before further refills   losartan (COZAAR) 50 MG tablet Take 50 mg by mouth every morning.   MELATONIN GUMMIES PO Take 10 mg by mouth at bedtime.   metFORMIN (GLUCOPHAGE) 500 MG tablet Take 500 mg by mouth 2 (two) times daily.   metoCLOPramide (REGLAN) 5 MG tablet Take 5 mg by mouth 3 (three) times daily.   montelukast (SINGULAIR) 10 MG tablet Take 10 mg by mouth at bedtime.   Multiple Vitamins-Minerals (MULTIVITAMIN WITH MINERALS) tablet Take 1 tablet by mouth daily. Woman   Nebulizer MISC Nebulizer tubing kit   olmesartan (BENICAR) 40 MG tablet    omeprazole (PRILOSEC) 20 MG capsule TAKE 1 CAPSULE(20 MG) BY MOUTH TWICE DAILY BEFORE A MEAL (Patient taking differently: 20 mg 2 (two) times daily before a meal.)   ondansetron (ZOFRAN-ODT) 8 MG disintegrating tablet Take 8 mg by mouth daily as needed for vomiting or nausea.   ONETOUCH ULTRA test strip 1 each by Other route in the morning, at noon, in the evening, and at bedtime.   oxyCODONE-acetaminophen (PERCOCET) 5-325 MG tablet Take 1 tablet by mouth every 6 (six) hours as needed.   polyethylene glycol (MIRALAX / GLYCOLAX) 17 g packet Take 17 g by mouth daily as needed. (Patient taking differently: Take 17 g by mouth daily as needed for moderate constipation or severe constipation.)   Potassium Chloride ER 20 MEQ TBCR Take 20 mEq by mouth daily.   Spacer/Aero-Holding Chambers (AEROCHAMBER PLUS) inhaler 1 each by Other route as needed for other. Use as instructed   topiramate (TOPAMAX) 100 MG tablet Take 1 tablet (100 mg total) by mouth 2 (two) times daily. (Patient taking differently: Take 100-150 mg by mouth 2 (two) times daily. 150 mg in am and 100 mg at night)   topiramate  (TOPAMAX) 50 MG tablet Take 1 tablet (50 mg total) by mouth daily.   torsemide (DEMADEX) 20 MG tablet Take 1 tablet daily for fluid gain of 3 lb overnight or 5 lbs in a week. (Patient taking differently: Take 20 mg by mouth as needed. Take 1 tablet daily for fluid gain of 3 lb overnight or 5 lbs in a week.)   triamcinolone cream (KENALOG) 0.1 % Apply 1 application. topically daily as needed (foot).   TRULICITY 3 OX/7.3ZH SOPN INJECT 3MG AS DIRECTED ONCE A WEEK (BULK)   TURMERIC PO Take 1 tablet by mouth daily at 6 (six) AM.   vitamin B-12 (CYANOCOBALAMIN) 1000 MCG tablet Take 1,000 mcg by mouth daily.   vitamin C (ASCORBIC ACID) 500 MG tablet Take 500 mg by mouth daily. Power C immune support   Facility-Administered Encounter Medications as of 07/27/2022  Medication   Benralizumab SOSY 30 mg    ALLERGIES: Allergies  Allergen Reactions   Ativan [Lorazepam] Hives   Phenergan [Promethazine Hcl] Hives  VACCINATION STATUS: Immunization History  Administered Date(s) Administered   Influenza Split 08/24/2018   Influenza Whole 07/15/2019   Influenza,inj,Quad PF,6+ Mos 07/10/2017, 10/27/2021   Moderna Sars-Covid-2 Vaccination 12/31/2019, 01/30/2020   Pneumococcal-Unspecified 10/10/2012    Diabetes She presents for her follow-up diabetic visit. She has type 2 diabetes mellitus. Onset time: She was diagnosed at approximate age of 67 years, stated by gestational diabetes with all 3 of her pregnancies in her 69s. Her disease course has been improving (Since her last visit, she fell and injured herself.  CT scan of the head did not show any intracranial bleeding.[place]). There are no hypoglycemic associated symptoms. Pertinent negatives for hypoglycemia include no confusion, headaches, pallor or seizures. Associated symptoms include fatigue. Pertinent negatives for diabetes include no blurred vision, no chest pain, no polydipsia, no polyphagia and no polyuria. There are no hypoglycemic complications.  Symptoms are improving. Diabetic complications include a CVA and heart disease. (She has history of open heart surgery for ASD in 1965.) Risk factors for coronary artery disease include dyslipidemia, diabetes mellitus, family history, obesity, hypertension, post-menopausal, sedentary lifestyle and tobacco exposure. Current diabetic treatments: She is currently on Lantus 45 units nightly, Humalog 7 units 3 times daily AC, Metformin 1000 g p.o. twice daily, Trulicity 1.5 mg subcutaneou 50 units nig. Her weight is stable. She is following a generally unhealthy diet. When asked about meal planning, she reported none. She has not had a previous visit with a dietitian. She rarely (She has medical problem with bronchiectasis for which she gets treated with steroids on and off.) participates in exercise. Her home blood glucose trend is decreasing steadily. Her breakfast blood glucose range is generally 140-180 mg/dl. Her lunch blood glucose range is generally 140-180 mg/dl. Her dinner blood glucose range is generally 140-180 mg/dl. Her bedtime blood glucose range is generally 140-180 mg/dl. Her overall blood glucose range is 140-180 mg/dl. (Ms. Meloni presents with improved glycemic profile.  Her freestyle libre device was analyzed.  AGP report shows 68% time in range, 27% level 1 hyperglycemia, 5% level 2 hyperglycemia.  She has no hypoglycemia.  Her point-of-care A1c is 7.7%, improving from 8.3%.   ) An ACE inhibitor/angiotensin II receptor blocker is being taken.  Hyperlipidemia This is a chronic problem. The current episode started more than 1 year ago. Exacerbating diseases include diabetes and obesity. Associated symptoms include myalgias. Pertinent negatives include no chest pain or shortness of breath. Current antihyperlipidemic treatment includes statins. Risk factors for coronary artery disease include dyslipidemia, diabetes mellitus, family history, obesity, hypertension, a sedentary lifestyle and  post-menopausal.  Hypertension This is a chronic problem. The current episode started more than 1 year ago. The problem is uncontrolled. Pertinent negatives include no blurred vision, chest pain, headaches, palpitations or shortness of breath. Risk factors for coronary artery disease include dyslipidemia, diabetes mellitus, obesity, sedentary lifestyle and smoking/tobacco exposure. Past treatments include ACE inhibitors. Hypertensive end-organ damage includes CVA.  Other The current episode started more than 1 month ago. The problem occurs constantly. Progression since onset: Patient reports that she did have exposure to steroids related to back pain on 2 separate occasions between November 2000 21 and 2022.  Last exposure was related to her COVID infection.  She reports that she developed fatigueced that she developed fatigue. Associated symptoms include fatigue and myalgias. Pertinent negatives include no arthralgias, chest pain, chills, coughing, fever, headaches, nausea, rash or vomiting. She has tried nothing for the symptoms. The treatment provided no relief.     Objective:  BP 130/74   Pulse 60   Ht _0  (1.626 m)   Wt 218 lb 3.2 oz (99 kg)   BMI 37.45 kg/m   Wt Readings from Last 3 Encounters:  07/27/22 218 lb (98.9 kg)  07/27/22 218 lb (98.9 kg)  07/27/22 218 lb 3.2 oz (99 kg)         Latest Ref Rng & Units 07/22/2022    6:19 PM 07/19/2022   12:00 AM 07/11/2022    9:28 AM  CMP  Glucose 70 - 99 mg/dL 112   140   BUN 6 - 20 mg/dL _1 Creatinine 0.44 - 1.00 mg/dL 0.67  0.8     0.66   Sodium 135 - 145 mmol/L 139  141     139   Potassium 3.5 - 5.1 mmol/L 4.0  4.6     4.6   Chloride 98 - 111 mmol/L 112  106     105   CO2 22 - 32 mmol/L _2 Calcium 8.9 - 10.3 mg/dL 9.2  9.7     8.8   Total Protein 6.0 - 8.5 g/dL   6.8   Total Bilirubin 0.0 - 1.2 mg/dL   0.2   Alkaline Phos 25 - 125  93     89   AST 13 - 35  13     11   ALT 7 - 35 U/L  14     10       This result is from an external source.    Lipid Panel     Component Value Date/Time   CHOL 149 07/19/2022 0000   TRIG 113 07/19/2022 0000   HDL 45 07/19/2022 0000   CHOLHDL 3.1 05/20/2021 1048   VLDL 22 05/20/2021 1048   LDLCALC 83 07/19/2022 0000    Latest Reference Range & Units Most Recent  Cortisol, Plasma ug/dL 7.3 01/13/22 13:10  Glucose  105 (E) 03/31/22 00:00  Hemoglobin A1C  7.5 (E) 11/18/21 00:00  HbA1c, POC (controlled diabetic range) 0.0 - 7.0 % 8.3 ! 06/08/22 10:29  TSH 0.41 - 5.90  1.54 (E) 03/31/22 00:00  T4,Free(Direct) 0.61 - 1.12 ng/dL 1.13 (H) 10/22/21 12:09  !: Data is abnormal (H): Data is abnormally high (E): External lab result Assessment & Plan:     1. DM type 2 causing vascular disease ([place])  Ms. Galiano presents with improved glycemic profile.  Her freestyle libre device was analyzed.  AGP report shows 68% time in range, 27% level 1 hyperglycemia, 5% level 2 hyperglycemia.  She has no hypoglycemia.  Her point-of-care A1c is 7.7%, improving from 8.3%.     -her diabetes is complicated by CVA, CHF, history of open heart surgery for ASD, obesity/sedentary life, history of chronic smoking with bronchiectasis and [place] Henzler remains at a high risk for more acute and chronic complications which include CAD, CVA, CKD, retinopathy, and neuropathy. These are all discussed in detail with the patient.  - I have counseled her on diet management and weight loss, by adopting a carbohydrate restricted/protein rich diet.  - she acknowledges that there is a room for improvement in her food and drink choices. - Suggestion is made for her to avoid simple carbohydrates  from her diet including Cakes, Sweet Desserts, Ice Cream, Soda (diet and regular), Sweet Tea, Candies, Chips, Cookies, Store Bought Juices, Alcohol , Artificial Sweeteners,  Coffee Creamer, and "Sugar-free"  Products, Lemonade. This will help patient to have more stable blood glucose profile and  potentially avoid unintended weight gain.  The following Lifestyle Medicine recommendations according to [place]  [facility]) were discussed and and offered to patient and she  agrees to start the journey:  A. Whole Foods, Plant-Based Nutrition comprising of fruits and vegetables, plant-based proteins, whole-grain carbohydrates was discussed in detail with the patient.   A list for source of those nutrients were also provided to the patient.  Patient will use only water or unsweetened tea for hydration. B.  The need to stay away from risky substances including alcohol, smoking; obtaining 7 to 9 hours of restorative sleep, at least 150 minutes of moderate intensity exercise weekly, the importance of healthy social connections,  and stress management techniques were discussed. C.  A full color page of  Calorie density of various food groups per pound showing examples of each food groups was provided to the patient.    - she is advised to stick to a routine mealtimes to eat 3 meals  a day and avoid unnecessary snacks ( to snack only to correct hypoglycemia).   - she has been scheduled with Jearld Fenton, RDN, CDE for individualized diabetes education.  - I have approached her with the following individualized plan to manage diabetes and patient agrees:   -She is preparing to have foot surgery and would like to keep her glycemic profile to near target.  She is advised to continue Lantus 50 units nightly, continue Trulicity 3 mg subcutaneously weekly.   -She will continue to use her CGM continuously.     She is advised to call clinic for blood glucose readings above 180 mg per DL x3, or less than 70 mg per DL.   -She is advised to continue metformin 500 mg p.o. twice daily.  - Patient specific target  A1c;  LDL, HDL, Triglycerides,  were discussed in detail.  2.  BP/HTN:  -Her blood pressure is controlled to target.  She advised to continue her current blood pressure  medications including hydralazine 25 mg 3 times daily.    3) Lipids/HPL: Her most recent lipid panel showed LDL of 78.  She is advised to continue atorvastatin 10 mg p.o. nightly.  Side effects and precautions discussed with her .  4)  Weight/Diet: Her BMI is 51.02--HENIDPO complicating her diabetes care.  She is a candidate for modest weight loss.   The above discussed dietary plan will help achieve weight loss. -She is working with her dietitian.     5 hypothyroidism-long-standing, etiology unclear, she has multiple family members with thyroid dysfunctions. Her previsit thyroid function tests are consistent with appropriate replacement.  She is advised to continue levothyroxine 112 mcg p.o. daily before breakfast.  - We discussed about the correct intake of her thyroid hormone, on empty stomach at fasting, with water, separated by at least 30 minutes from breakfast and other medications,  and separated by more than 4 hours from calcium, iron, multivitamins, acid reflux medications (PPIs). -Patient is made aware of the fact that thyroid hormone replacement is needed for life, dose to be adjusted by periodic monitoring of thyroid function tests.   6) vitamin D deficiency: - She is currently on  vitamin D supplements including vitamin D3 5000 units daily.     7) Chronic Care/Health Maintenance:  -she  is on  Statin medications and  is encouraged to continue to follow up with Ophthalmology, Dentist,  Podiatrist  at least yearly or according to recommendations, and advised to  stay away from smoking. I have recommended yearly flu vaccine and pneumonia vaccination at least every 5 years;  and  sleep for at least 7 hours a day.  - I advised patient to maintain close follow up with [name], MD for primary care needs.   I spent 41 minutes in the care of the patient today including review of labs from [place], Lipids, Thyroid Function, Hematology (current and previous including abstractions from  other facilities); face-to-face time discussing  her blood glucose readings/logs, discussing hypoglycemia and hyperglycemia episodes and symptoms, medications doses, her options of short and long term treatment based on the latest standards of care / guidelines;  discussion about incorporating lifestyle medicine;  and documenting the encounter. Risk reduction counseling performed per USPSTF guidelines to reduce obesity and cardiovascular risk factors.     Please refer to Patient Instructions for Blood Glucose Monitoring and Insulin/Medications Dosing Guide"  in media tab for additional information. Please  also refer to " Patient Self Inventory" in the Media  tab for reviewed elements of pertinent patient history.  Merci Walthers participated in the discussions, expressed understanding, and voiced agreement with the above plans.  All questions were answered to her satisfaction. she is encouraged to contact clinic should she have any questions or concerns prior to her return visit.     Follow up plan: - Return in about 3 months (around 10/27/2022) for Bring Meter/CGM Device/Logs- A1c in Office.  Glade Lloyd, MD [facility] [facility] [address] [place], [place] [address] Phone: [phone]  Fax: [phone]    07/27/2022, 3:30 PM  This note was partially dictated with voice recognition software. Similar sounding words can be transcribed inadequately or may not  be corrected upon review.

## 2022-07-28 ENCOUNTER — Encounter: Payer: Self-pay | Admitting: Nutrition

## 2022-07-28 LAB — VITAMIN B12: Vitamin B-12: 1488 pg/mL — ABNORMAL HIGH (ref 211–911)

## 2022-07-28 LAB — SEDIMENTATION RATE: Sed Rate: 21 mm/hr (ref 0–30)

## 2022-07-28 LAB — C-REACTIVE PROTEIN: CRP: <1 mg/dL (ref 0.5–20.0)

## 2022-07-28 LAB — TSH: TSH: 1.83 u[IU]/mL (ref 0.35–5.50)

## 2022-07-28 LAB — FOLATE: Folate: 17.1 ng/mL (ref >5.9)

## 2022-07-28 NOTE — Telephone Encounter (Signed)
Let me know if pcp lets [place] know why losartan was lowered from '50mg'$  to '25mg'$ . Perhaps low bp or she was having some recurrent dizziness. Will see what they say. Is patient aware on why they may have lowered it?   J Laura Tabbert MD

## 2022-07-28 NOTE — Telephone Encounter (Addendum)
Spoke with Dr. Juel Burrow nurse who states that pt should be on on Olmasartan. Pt was seen two days ago and did not mention being out of medicine. Nurse reports that Olmesartan 40 mg was called in 04/19/22 for 90 tablets and 11 refills to [facility] pharmacy. Pt reports the her BP's have been 130's / 60's. Please advise.

## 2022-07-28 NOTE — Telephone Encounter (Signed)
Spoke with pt who states that she has been taking Losartan 50 mg daily. She reports that this was increased to 50 mg by Dr. Harl Bowie and she has been taking since. She states that she stopped taking Olmesatan. Nurse called PCP office and they stated that Losartan 50 mg was stopped on 9/29 and Losartan 25 mg was filled on Oct. 14. Pt is requesting a refill of Losartan 50 mg. Pt only has one pill left of Losartan 50 mg. Please advise.

## 2022-07-29 ENCOUNTER — Ambulatory Visit: Payer: Medicare Other | Admitting: Urgent Care

## 2022-07-29 ENCOUNTER — Other Ambulatory Visit: Payer: Self-pay

## 2022-07-29 ENCOUNTER — Encounter: Payer: Self-pay | Admitting: Podiatry

## 2022-07-29 ENCOUNTER — Encounter
Admission: RE | Disposition: A | Discharge status: Home / Self Care | Payer: Self-pay | Source: Home / Self Care | Attending: Podiatry

## 2022-07-29 ENCOUNTER — Ambulatory Visit
Admission: RE | Admit: 2022-07-29 | Discharge: 2022-07-29 | Disposition: A | Discharge status: Home / Self Care | Payer: Medicare Other | Attending: Podiatry | Admitting: Podiatry

## 2022-07-29 ENCOUNTER — Ambulatory Visit: Payer: Medicare Other

## 2022-07-29 DIAGNOSIS — I110 Hypertensive heart disease with heart failure: Secondary | ICD-10-CM | POA: Insufficient documentation

## 2022-07-29 DIAGNOSIS — G4733 Obstructive sleep apnea (adult) (pediatric): Secondary | ICD-10-CM | POA: Insufficient documentation

## 2022-07-29 DIAGNOSIS — Z79899 Other long term (current) drug therapy: Secondary | ICD-10-CM | POA: Insufficient documentation

## 2022-07-29 DIAGNOSIS — M2011 Hallux valgus (acquired), right foot: Secondary | ICD-10-CM | POA: Insufficient documentation

## 2022-07-29 DIAGNOSIS — Z7984 Long term (current) use of oral hypoglycemic drugs: Secondary | ICD-10-CM | POA: Diagnosis not present

## 2022-07-29 DIAGNOSIS — I480 Paroxysmal atrial fibrillation: Secondary | ICD-10-CM | POA: Insufficient documentation

## 2022-07-29 DIAGNOSIS — D801 Nonfamilial hypogammaglobulinemia: Secondary | ICD-10-CM | POA: Insufficient documentation

## 2022-07-29 DIAGNOSIS — M2040 Other hammer toe(s) (acquired), unspecified foot: Secondary | ICD-10-CM | POA: Diagnosis not present

## 2022-07-29 DIAGNOSIS — M24574 Contracture, right foot: Secondary | ICD-10-CM

## 2022-07-29 DIAGNOSIS — R809 Proteinuria, unspecified: Secondary | ICD-10-CM | POA: Insufficient documentation

## 2022-07-29 DIAGNOSIS — G250 Essential tremor: Secondary | ICD-10-CM | POA: Insufficient documentation

## 2022-07-29 DIAGNOSIS — M545 Low back pain, unspecified: Secondary | ICD-10-CM | POA: Insufficient documentation

## 2022-07-29 DIAGNOSIS — E782 Mixed hyperlipidemia: Secondary | ICD-10-CM | POA: Diagnosis not present

## 2022-07-29 DIAGNOSIS — M2041 Other hammer toe(s) (acquired), right foot: Secondary | ICD-10-CM

## 2022-07-29 DIAGNOSIS — I442 Atrioventricular block, complete: Secondary | ICD-10-CM | POA: Insufficient documentation

## 2022-07-29 DIAGNOSIS — G629 Polyneuropathy, unspecified: Secondary | ICD-10-CM | POA: Insufficient documentation

## 2022-07-29 DIAGNOSIS — Z7951 Long term (current) use of inhaled steroids: Secondary | ICD-10-CM | POA: Diagnosis not present

## 2022-07-29 DIAGNOSIS — E1140 Type 2 diabetes mellitus with diabetic neuropathy, unspecified: Secondary | ICD-10-CM | POA: Insufficient documentation

## 2022-07-29 DIAGNOSIS — E039 Hypothyroidism, unspecified: Secondary | ICD-10-CM | POA: Insufficient documentation

## 2022-07-29 DIAGNOSIS — F4310 Post-traumatic stress disorder, unspecified: Secondary | ICD-10-CM | POA: Insufficient documentation

## 2022-07-29 DIAGNOSIS — J45909 Unspecified asthma, uncomplicated: Secondary | ICD-10-CM | POA: Diagnosis not present

## 2022-07-29 DIAGNOSIS — I509 Heart failure, unspecified: Secondary | ICD-10-CM | POA: Diagnosis not present

## 2022-07-29 DIAGNOSIS — Z01818 Encounter for other preprocedural examination: Secondary | ICD-10-CM

## 2022-07-29 DIAGNOSIS — Z6837 Body mass index (BMI) 37.0-37.9, adult: Secondary | ICD-10-CM | POA: Diagnosis not present

## 2022-07-29 DIAGNOSIS — Z9641 Presence of insulin pump (external) (internal): Secondary | ICD-10-CM | POA: Insufficient documentation

## 2022-07-29 DIAGNOSIS — I5032 Chronic diastolic (congestive) heart failure: Secondary | ICD-10-CM | POA: Diagnosis not present

## 2022-07-29 DIAGNOSIS — Z87891 Personal history of nicotine dependence: Secondary | ICD-10-CM | POA: Diagnosis not present

## 2022-07-29 DIAGNOSIS — M21619 Bunion of unspecified foot: Secondary | ICD-10-CM | POA: Diagnosis not present

## 2022-07-29 HISTORY — DX: Spinal stenosis, lumbar region without neurogenic claudication: M48.061

## 2022-07-29 HISTORY — DX: Bradycardia, unspecified: R00.1

## 2022-07-29 HISTORY — DX: Atherosclerosis of aorta: I70.0

## 2022-07-29 HISTORY — DX: Other hammer toe(s) (acquired), right foot: M20.41

## 2022-07-29 HISTORY — DX: Unspecified atrial fibrillation: I48.91

## 2022-07-29 HISTORY — DX: Sleep disorder, unspecified: G47.9

## 2022-07-29 HISTORY — DX: Essential (primary) hypertension: I10

## 2022-07-29 HISTORY — DX: Constipation, unspecified: K59.00

## 2022-07-29 HISTORY — DX: Hallux valgus (acquired), right foot: M20.11

## 2022-07-29 HISTORY — PX: HALLUX VALGUS AUSTIN: SHX6623

## 2022-07-29 HISTORY — DX: Atrioventricular block, complete: I44.2

## 2022-07-29 HISTORY — DX: Other intervertebral disc degeneration, thoracic region: M51.34

## 2022-07-29 HISTORY — DX: Long term (current) use of anticoagulants: Z79.01

## 2022-07-29 HISTORY — PX: HAMMER TOE SURGERY: SHX385

## 2022-07-29 HISTORY — DX: Hyperlipidemia, unspecified: E78.5

## 2022-07-29 HISTORY — DX: Migraine, unspecified, not intractable, without status migrainosus: G43.909

## 2022-07-29 HISTORY — DX: Personal history of COVID-19: Z86.16

## 2022-07-29 LAB — GLUCOSE, CAPILLARY
Glucose-Capillary: 202 mg/dL — ABNORMAL HIGH (ref 70–99)
Glucose-Capillary: 229 mg/dL — ABNORMAL HIGH (ref 70–99)
Glucose-Capillary: 231 mg/dL — ABNORMAL HIGH (ref 70–99)

## 2022-07-29 SURGERY — CORRECTION, HALLUX VALGUS
Anesthesia: General | Site: Toe | Laterality: Right

## 2022-07-29 MED ORDER — OXYCODONE HCL 5 MG PO TABS: 5.0000 mg | ORAL_TABLET | Freq: Once | ORAL | Status: AC | PRN

## 2022-07-29 MED ORDER — OXYCODONE HCL 5 MG PO TABS: 5.0000 mg | ORAL_TABLET | Freq: Four times a day (QID) | ORAL | 0 refills | Status: AC | PRN

## 2022-07-29 MED ORDER — MIDAZOLAM HCL 2 MG/2ML IJ SOLN
INTRAMUSCULAR | Status: DC | PRN
  Administered 2022-07-29: 2 mg via INTRAVENOUS

## 2022-07-29 MED ORDER — MIDAZOLAM HCL 2 MG/2ML IJ SOLN
INTRAMUSCULAR | Status: AC
  Filled 2022-07-29: qty 2

## 2022-07-29 MED ORDER — CEFAZOLIN SODIUM-DEXTROSE 2-4 GM/100ML-% IV SOLN
2.0000 g | INTRAVENOUS | Status: AC
  Administered 2022-07-29: 2 g via INTRAVENOUS

## 2022-07-29 MED ORDER — FENTANYL CITRATE (PF) 100 MCG/2ML IJ SOLN
INTRAMUSCULAR | Status: DC | PRN
  Administered 2022-07-29 (×2): 50 ug via INTRAVENOUS

## 2022-07-29 MED ORDER — CHLORHEXIDINE GLUCONATE 0.12 % MT SOLN: 15.0000 mL | Freq: Once | OROMUCOSAL | Status: AC

## 2022-07-29 MED ORDER — LACTATED RINGERS IV SOLN: INTRAVENOUS | Status: DC | PRN

## 2022-07-29 MED ORDER — CEFAZOLIN SODIUM-DEXTROSE 2-4 GM/100ML-% IV SOLN
INTRAVENOUS | Status: AC
  Filled 2022-07-29: qty 100

## 2022-07-29 MED ORDER — ORAL CARE MOUTH RINSE: 15.0000 mL | Freq: Once | OROMUCOSAL | Status: AC

## 2022-07-29 MED ORDER — PHENYLEPHRINE HCL-NACL 20-0.9 MG/250ML-% IV SOLN
INTRAVENOUS | Status: AC
  Filled 2022-07-29: qty 250

## 2022-07-29 MED ORDER — IPRATROPIUM-ALBUTEROL 0.5-2.5 (3) MG/3ML IN SOLN
RESPIRATORY_TRACT | Status: AC
  Administered 2022-07-29: 3 mL via RESPIRATORY_TRACT
  Filled 2022-07-29: qty 3

## 2022-07-29 MED ORDER — 0.9 % SODIUM CHLORIDE (POUR BTL) OPTIME
TOPICAL | Status: DC | PRN
  Administered 2022-07-29: 500 mL

## 2022-07-29 MED ORDER — ACETAMINOPHEN 500 MG PO TABS: 1000.0000 mg | ORAL_TABLET | Freq: Four times a day (QID) | ORAL | 0 refills | Status: AC | PRN

## 2022-07-29 MED ORDER — BUPIVACAINE HCL (PF) 0.5 % IJ SOLN
INTRAMUSCULAR | Status: AC
  Filled 2022-07-29: qty 30

## 2022-07-29 MED ORDER — INSULIN ASPART 100 UNIT/ML IJ SOLN
INTRAMUSCULAR | Status: AC
  Filled 2022-07-29: qty 1

## 2022-07-29 MED ORDER — HYDROMORPHONE HCL 1 MG/ML IJ SOLN
0.2500 mg | INTRAMUSCULAR | Status: DC | PRN
  Administered 2022-07-29: 0.5 mg via INTRAVENOUS
  Administered 2022-07-29 (×2): 0.25 mg via INTRAVENOUS

## 2022-07-29 MED ORDER — LIDOCAINE HCL (CARDIAC) PF 100 MG/5ML IV SOSY
PREFILLED_SYRINGE | INTRAVENOUS | Status: DC | PRN
  Administered 2022-07-29: 100 mg via INTRAVENOUS

## 2022-07-29 MED ORDER — IPRATROPIUM-ALBUTEROL 0.5-2.5 (3) MG/3ML IN SOLN: 3.0000 mL | RESPIRATORY_TRACT | Status: AC

## 2022-07-29 MED ORDER — FENTANYL CITRATE (PF) 100 MCG/2ML IJ SOLN
INTRAMUSCULAR | Status: AC
  Filled 2022-07-29: qty 2

## 2022-07-29 MED ORDER — CHLORHEXIDINE GLUCONATE 0.12 % MT SOLN
OROMUCOSAL | Status: AC
  Administered 2022-07-29: 15 mL via OROMUCOSAL
  Filled 2022-07-29: qty 15

## 2022-07-29 MED ORDER — OXYCODONE HCL 5 MG PO TABS
ORAL_TABLET | ORAL | Status: AC
  Filled 2022-07-29: qty 1

## 2022-07-29 MED ORDER — HYDROMORPHONE HCL 1 MG/ML IJ SOLN
INTRAMUSCULAR | Status: AC
  Filled 2022-07-29: qty 1

## 2022-07-29 MED ORDER — LIDOCAINE HCL (PF) 1 % IJ SOLN
INTRAMUSCULAR | Status: AC
  Filled 2022-07-29: qty 30

## 2022-07-29 MED ORDER — ALBUTEROL SULFATE HFA 108 (90 BASE) MCG/ACT IN AERS
INHALATION_SPRAY | RESPIRATORY_TRACT | Status: DC | PRN
  Administered 2022-07-29: 8 via RESPIRATORY_TRACT

## 2022-07-29 MED ORDER — HYDROMORPHONE HCL 1 MG/ML IJ SOLN
INTRAMUSCULAR | Status: AC
  Administered 2022-07-29: 0.25 mg via INTRAVENOUS
  Filled 2022-07-29: qty 1

## 2022-07-29 MED ORDER — INSULIN ASPART 100 UNIT/ML IJ SOLN
8.0000 [IU] | INTRAMUSCULAR | Status: AC
  Administered 2022-07-29: 8 [IU] via SUBCUTANEOUS

## 2022-07-29 MED ORDER — PROPOFOL 1000 MG/100ML IV EMUL
INTRAVENOUS | Status: AC
  Filled 2022-07-29: qty 100

## 2022-07-29 MED ORDER — ONDANSETRON HCL 4 MG/2ML IJ SOLN
INTRAMUSCULAR | Status: DC | PRN
  Administered 2022-07-29: 4 mg via INTRAVENOUS

## 2022-07-29 MED ORDER — LIDOCAINE HCL 1 % IJ SOLN
INTRAMUSCULAR | Status: DC | PRN
  Administered 2022-07-29: 20 mL

## 2022-07-29 MED ORDER — PROPOFOL 10 MG/ML IV BOLUS
INTRAVENOUS | Status: DC | PRN
  Administered 2022-07-29: 180 mg via INTRAVENOUS

## 2022-07-29 MED ORDER — OXYCODONE HCL 5 MG PO TABS
ORAL_TABLET | ORAL | Status: AC
  Administered 2022-07-29: 5 mg via ORAL
  Filled 2022-07-29: qty 1

## 2022-07-29 MED ORDER — OXYCODONE HCL 5 MG/5ML PO SOLN: 5.0000 mg | Freq: Once | ORAL | Status: AC | PRN

## 2022-07-29 MED ORDER — OXYCODONE HCL 5 MG PO TABS
5.0000 mg | ORAL_TABLET | Freq: Once | ORAL | Status: AC
  Administered 2022-07-29: 5 mg via ORAL

## 2022-07-29 MED ORDER — SODIUM CHLORIDE 0.9 % IV SOLN: INTRAVENOUS | Status: DC

## 2022-07-29 MED ORDER — INSULIN ASPART 100 UNIT/ML IJ SOLN: 8.0000 [IU] | INTRAMUSCULAR | Status: DC

## 2022-07-29 MED ORDER — PHENYLEPHRINE HCL (PRESSORS) 10 MG/ML IV SOLN
INTRAVENOUS | Status: DC | PRN
  Administered 2022-07-29: 80 ug via INTRAVENOUS
  Administered 2022-07-29: 160 ug via INTRAVENOUS

## 2022-07-29 MED ORDER — EPHEDRINE SULFATE (PRESSORS) 50 MG/ML IJ SOLN
INTRAMUSCULAR | Status: DC | PRN
  Administered 2022-07-29 (×2): 5 mg via INTRAVENOUS

## 2022-07-29 MED ORDER — DEXAMETHASONE SODIUM PHOSPHATE 10 MG/ML IJ SOLN
INTRAMUSCULAR | Status: DC | PRN
  Administered 2022-07-29: 5 mg via INTRAVENOUS

## 2022-07-29 SURGICAL SUPPLY — 58 items
0.62"C wire (Wire) IMPLANT
1.4 K wire IMPLANT
1.6 capital frag wire IMPLANT
1.6 k wire IMPLANT
2.9 Cannulated drill bit IMPLANT
3.6 mm Cannulated drill bit IMPLANT
BIT DRILL CANN 2.9 (BIT) IMPLANT
BIT DRILL CANN 3.6 (BIT) IMPLANT
BLADE OSC/SAGITTAL MD 5.5X18 (BLADE) IMPLANT
BNDG CMPR 5X4 CHSV STRCH STRL (GAUZE/BANDAGES/DRESSINGS) ×2
BNDG CMPR STD VLCR NS LF 5.8X4 (GAUZE/BANDAGES/DRESSINGS) ×2
BNDG COHESIVE 4X5 TAN STRL LF (GAUZE/BANDAGES/DRESSINGS) IMPLANT
BNDG ELASTIC 4X5.8 VLCR NS LF (GAUZE/BANDAGES/DRESSINGS) ×2 IMPLANT
BNDG ELASTIC 4X5.8 VLCR STR LF (GAUZE/BANDAGES/DRESSINGS) IMPLANT
BNDG ESMARK 4X12 TAN STRL LF (GAUZE/BANDAGES/DRESSINGS) ×2 IMPLANT
BNDG GAUZE DERMACEA FLUFF 4 (GAUZE/BANDAGES/DRESSINGS) ×2 IMPLANT
BNDG GZE 12X3 1 PLY HI ABS (GAUZE/BANDAGES/DRESSINGS) ×2
BNDG GZE DERMACEA 4 6PLY (GAUZE/BANDAGES/DRESSINGS) ×2
BNDG STRETCH GAUZE 3IN X12FT (GAUZE/BANDAGES/DRESSINGS) IMPLANT
BUR MIS STRT 2.0X19.5 (BUR) IMPLANT
BURR 2X19.5 (BUR) ×2
BURR MIS STRT 2.0X19.5 (BUR) ×2
COVER LIGHT HANDLE STERIS (MISCELLANEOUS) ×4 IMPLANT
COVER PIN YLW 0.028-062 (MISCELLANEOUS) IMPLANT
CUFF TOURN SGL QUICK 18X4 (TOURNIQUET CUFF) ×2 IMPLANT
DRAPE FLUOR MINI C-ARM 54X84 (DRAPES) ×2 IMPLANT
DRSG XEROFORM 1X8 (GAUZE/BANDAGES/DRESSINGS) IMPLANT
DURAPREP 26ML APPLICATOR (WOUND CARE) ×2 IMPLANT
ELECT REM PT RETURN 9FT ADLT (ELECTROSURGICAL) ×2
ELECTRODE REM PT RTRN 9FT ADLT (ELECTROSURGICAL) ×2 IMPLANT
GAUZE SPONGE 4X4 12PLY STRL (GAUZE/BANDAGES/DRESSINGS) ×2 IMPLANT
GAUZE XEROFORM 1X8 LF (GAUZE/BANDAGES/DRESSINGS) ×2 IMPLANT
GLOVE BIO SURGEON STRL SZ7 (GLOVE) ×2 IMPLANT
GLOVE BIOGEL PI IND STRL 7.0 (GLOVE) ×2 IMPLANT
GOWN STRL REUS W/ TWL LRG LVL3 (GOWN DISPOSABLE) ×4 IMPLANT
GOWN STRL REUS W/TWL LRG LVL3 (GOWN DISPOSABLE) ×4
GUIDEWIRE 1.6X150 (WIRE) IMPLANT
GUIDEWIRE BEVELED FT 1.4X3.5 (WIRE) IMPLANT
GUIDEWIRE BEVELED FT 1.6X4 (WIRE) IMPLANT
Irrigation tubing set 3.8 M IMPLANT
KIT TURNOVER KIT A (KITS) ×2 IMPLANT
MANIFOLD NEPTUNE II (INSTRUMENTS) ×2 IMPLANT
NS IRRIG 500ML POUR BTL (IV SOLUTION) ×2 IMPLANT
PACK EXTREMITY ARMC (MISCELLANEOUS) ×2 IMPLANT
PENCIL SMOKE EVACUATOR (MISCELLANEOUS) ×2 IMPLANT
SCREW BEVELED FT 3.5X32 (Screw) IMPLANT
SCREW BEVELED FT 4X36 (Screw) IMPLANT
SET IRRIGATION TUBING (TUBING) IMPLANT
SPLINT CAST 1 STEP 4X30 (MISCELLANEOUS) ×2 IMPLANT
STOCKINETTE IMPERVIOUS 9X36 MD (GAUZE/BANDAGES/DRESSINGS) ×2 IMPLANT
STOCKINETTE M/LG 89821 (MISCELLANEOUS) IMPLANT
SUT ETHILON 4-0 (SUTURE) ×4
SUT ETHILON 4-0 FS2 18XMFL BLK (SUTURE) ×4
SUTURE ETHLN 4-0 FS2 18XMF BLK (SUTURE) IMPLANT
Straight Burr IMPLANT
TRAP FLUID SMOKE EVACUATOR (MISCELLANEOUS) ×2 IMPLANT
WATER STERILE IRR 500ML POUR (IV SOLUTION) ×2 IMPLANT
WIRE Z .062 C-WIRE SPADE TIP (WIRE) IMPLANT

## 2022-07-29 NOTE — Discharge Instructions (Addendum)
Post-Surgery Instructions  You may resume taking your eliquis tomorrow morning.  1. If you are recuperating from surgery anywhere other than home, please be sure to leave [place] a number where you can be reached. 2. Go directly home and rest. 3. The keep operated foot (or feet) elevated six inches above the hip when sitting or lying down. 4. Support the elevated foot and leg with pillows under the calf. DO NOT PLACE PILLOWS UNDER THE KNEE. 5. DO NOT REMOVE or get your bandages wet. This will increase your chances of getting an infection. 6. Wear your surgical shoe at all times when you are up. 7. A limited amount of pain and swelling may occur. The skin may take on a bruised appearance. This is no cause for alarm. 8. For slight pain and swelling, apply an ice pack directly over the bandage and behind the knee for 15 minutes every hour. Continue icing until seen in the office. DO NOT apply any form of heat to the area. 9. Have prescription(s) filled immediately and take as directed. 10. Drink lots of liquids, water, and juice. 11. CALL THE OFFICE IMMEDIATELY IF: a. Bleeding continues b. Pain increases and/or does not respond to medication c. Bandage or cast appears too tight d. Any liquids (water, coffee, etc.) have spilled on your bandages. e. Tripping, falling, or stubbing the surgical foot f. If your temperature rises above 101 g. If you have ANY questions at all 12. Please use the crutches, knee scooter, or walker you have prescribed, rented, or purchased.  If you are weight-bearing, follow your physician's instructions. You are expected to be: ? weight-bearing ? non-weight bearing 13. Special Instructions: You may put weight on the foot in the boot. Focus your weight on the heel. Do this only as necessary to use the bathroom, eat, etc. As much as possible please rest, elevate your leg and put ice on the foot and behind the knee. You may loosen or undo the straps of the boot while  resting if it is causing pain, but anytime you are up and moving the boot must remain on   14. Your next appointment is: 08/04/2022 1:45 PM   If you need to reach the nurse for any reason, please call: [place]: [phone] [place]: [phone] [place]: [phone]   AMBULATORY SURGERY  DISCHARGE INSTRUCTIONS   The drugs that you were given will stay in your system until tomorrow so for the next 24 hours you should not:  Drive an automobile Make any legal decisions Drink any alcoholic beverage   You may resume regular meals tomorrow.  Today it is better to start with liquids and gradually work up to solid foods.  You may eat anything you prefer, but it is better to start with liquids, then soup and crackers, and gradually work up to solid foods.   Please notify your doctor immediately if you have any unusual bleeding, trouble breathing, redness and pain at the surgery site, drainage, fever, or pain not relieved by medication.      Additional Instructions:

## 2022-07-29 NOTE — Transfer of Care (Addendum)
Immediate Anesthesia Transfer of Care Note  Patient: Laura [name]  Procedure(s) Performed: HALLUX VALGUS AUSTIN WITH Barbie Banner (Right) METATARSAL OSTEOTOMY (Right: Toe) HAMMER TOE CORRECTION 2ND (Right: Toe)  Patient Location: PACU  Anesthesia Type:General  Level of Consciousness: drowsy  Airway & Oxygen Therapy: Patient Spontanous Breathing and Patient connected to face mask oxygen  Post-op Assessment: Report given to RN and Post -op Vital signs reviewed and stable  Post vital signs: Reviewed and stable  Last Vitals:  Vitals Value Taken Time  BP    Temp    Pulse    Resp    SpO2      Last Pain:  Vitals:   07/29/22 0639  TempSrc: Temporal  PainSc: 0-No pain      Patients Stated Pain Goal: 0 (85/99/23 4144)  Complications: No notable events documented.

## 2022-07-29 NOTE — Anesthesia Postprocedure Evaluation (Signed)
Anesthesia Post Note  Patient: Laura [name]  Procedure(s) Performed: HALLUX VALGUS (Right) HAMMER TOE CORRECTION 2ND (Right: Toe)  Patient location during evaluation: PACU Anesthesia Type: General Level of consciousness: awake and alert Pain management: pain level controlled Vital Signs Assessment: post-procedure vital signs reviewed and stable Respiratory status: spontaneous breathing, nonlabored ventilation, respiratory function stable and patient connected to nasal cannula oxygen Cardiovascular status: blood pressure returned to baseline and stable Postop Assessment: no apparent nausea or vomiting Anesthetic complications: no   No notable events documented.   Last Vitals:  Vitals:   07/29/22 1030 07/29/22 1045  BP: (!) 119/49 (!) 121/48  Pulse: 60 62  Resp: 11 15  Temp: 37.2 C   SpO2: 94% 98%    Last Pain:  Vitals:   07/29/22 1055  TempSrc:   PainSc: 5                  Ilene Qua

## 2022-07-29 NOTE — Op Note (Signed)
Patient Name: Laura [name] DOB: [birth year]  MRN: [id number]   Date of Service: 07/29/2022  Surgeon: Dr. Lanae Crumbly, DPM Assistants: None Pre-operative Diagnosis:  Hallux valgus right foot Hammertoe right foot  Post-operative Diagnosis:  Hallux valgus right foot Hammertoe right foot Joint contracture of right foot  Procedures:  1) correction hallux valgus with metatarsal osteotomy right foot  2) correction hammertoe second right  3) capsulotomy metatarsal phalangeal joint second Pathology/Specimens: * No specimens in log * Anesthesia: General with ankle block Hemostasis:  Total Tourniquet Time Documented: Calf (Right) - 62 minutes Total: Calf (Right) - 62 minutes  Estimated Blood Loss: 1 mL Materials:  Implant Name Type Inv. Item Serial No. Manufacturer Lot No. LRB No. Used Action  0.62"C wire  Wire   CONMED [id number] Right 1 Implanted  4.0 mm X 36 beveled screw  Screw   [employer]  Right 1 Implanted  3.5 mm X 32 mm Bevelled Screw Screw   [employer]  Right 1 Implanted   Medications: 20 cc 0.5% Marcaine and 2% lidocaine plain and 74-12 mix Complications: No complications  Indications for Procedure:  This is a 61 y.o. adult with a history of right foot hallux valgus deformity and second hammertoe contracture.  After failing nonsurgical treatment she elected for operative intervention.  No guarantees as the outcome of surgery were able to be made.  Informed consent was signed and reviewed prior to surgery.  All questions were addressed.   Procedure in Detail:   After identifying the patient in the pre-operative holding area and marking the right lower extremity as the correct side and site of surgery, the patient was brought to the operating room and transferred from the stretcher to the operating room table and placed in the supine position.  A safety belt was placed and secured around waist.  A timeout was then performed.  The patient was then sedated with IV sedation by  the anesthesia team.  A local field block was then performed with 20 cc 2% Lidocaine and 0.5% Marcaine in a 50-50 mix.  The right lower extremity was then prepped and draped in the usual sterile fashion.  The right lower extremity was elevated, exsanguinated and the pneumatic calf tourniquet was inflated to 250 mmHg.  The fluoroscope was used to locate the distal metaphyseal/diaphyseal junction of the first metatarsal. An incision was made with a beaver blade and the elevator was used to raise the soft tissues over the dorsal metatarsal. The Arthrex cutting burr was used to create a transverse osteotomy with axis directed approximately 10 degrees distally and 20 degrees plantar. The osteotomy was completed, and the capital fragment was translated laterally utilizing the positioner to reduce the intermetatarsal angle, was then temporarily fixated with Kirschner wire.  The toe was placed in a varus rotation to correct as much frontal plane deformity as possible. While holding reduction manually, the guidewire for the 4.44m proximal screw was advanced across the osteotomy. An incision was made over this and the path was then drilled and the screw inserted with maintenance of the reduction under fluoroscopy. The guidewire for the 3.545mdistal screw was advanced across the osteotomy. An incision was made over this and the path was then drilled and the screw inserted. Good stability and compression across the osteotomy was noted.  This corrected the deformity and prominent bone medially and no further bony resection or Akin osteotomy was necessary.  Once an appropriate radiographic and clinical result was achieved the incisions were thoroughly irrigated  and rasped of all bony debris with an angiocatheter with copious amounts of normal sterile saline. Closure was then initiated and consisted of 4-0 nylon sutures.   Attention was then directed to the second digits where a transverse incision was made overlying the  proximal interphalangeal joint.  This was carried deep to expose the extensor digitorum longus tendon and a transverse tenotomy was made.  The PIPJ was exposed and the collateral ligaments were released.  The articular surface of the head of the proximal phalanx and the base of the middle phalanx was then resected using a sagittal saw.  A 0.062 inch Kirschner wire was then inserted through the base of the middle phalanx through the distal phalanx and out the tip of the toe just under the nail.  This was then driven retrograde to the level of the base of the proximal phalanx.  This corrected the contracture at the level of the interphalangeal joint, she had residual deformity at the metatarsal phalangeal joint.  Small incision was made here and blunt dissection was used to elevate the extensor tendons and soft tissues overlying the dorsal capsule.  A dorsal capsulotomy was completed and plantarflexion of the toe reduce the contracture here.  The 0.062 inch Kirschner wire was then advanced into the metatarsal head and neck to maintain correction.  Good compression of the arthrodesis site was noted clinically and fluoroscopically with adequate reduction of the deformity.   A postop dressing was applied consisting of xeroform, 4x4 gauze, stretch gauze and Ace wrap was applied under light compression with the hallux in a slight frontal varus and neutral transverse position.  The patient tolerated the procedure well.  The tourniquet was then deflated.  Immediate capillary refill was noted to all toes.  She was aroused from sedation and transferred back to the stretcher and to the post anesthesia care unit for further monitoring.  She was discharged to home in good condition, and will be weightbearing as tolerated in a cam walker boot. She will follow up with me in 1 week for postop evaluation.

## 2022-07-29 NOTE — Progress Notes (Signed)
FSBS 238 Dr. Danne Baxter notified no orders received , okay to discharge pt. Home . Order received for pain med . See MAR .

## 2022-07-29 NOTE — H&P (Signed)
History and Physical Interval Note:  07/29/2022 7:23 AM  Laura [name]  has presented today for surgery, with the diagnosis of bunion and hammertoe of right foot.  The various methods of treatment have been discussed with the patient and family. After consideration of risks, benefits and other options for treatment, the patient has consented to   Procedure(s): [place] (Right) METATARSAL OSTEOTOMY (Right) HAMMER TOE CORRECTION 2ND (Right) as a surgical intervention.  The patient's history has been reviewed, patient examined, no change in status, stable for surgery.  I have reviewed the patient's chart and labs.  Questions were answered to the patient's satisfaction.     [name]

## 2022-07-29 NOTE — Progress Notes (Signed)
I evaluated the patient in the postanesthesia care unit, concern for pallor of the second toe.  Ace wrap was removed and ankle was put through range of motion.  The toe was massaged and the dressings were loosened but not interrupted.  Return of capillary fill time was noted.  No further action necessary the Ace wrap was reapplied with gentle compression and patient was discharged safely to home

## 2022-07-29 NOTE — Anesthesia Procedure Notes (Signed)
Procedure Name: LMA Insertion Date/Time: 07/29/2022 7:48 AM  Performed by: Beverely Low, CRNAPre-anesthesia Checklist: Patient identified, Patient being monitored, Timeout performed, Emergency Drugs available and Suction available Patient Re-evaluated:Patient Re-evaluated prior to induction Oxygen Delivery Method: Circle system utilized Preoxygenation: Pre-oxygenation with 100% oxygen Induction Type: IV induction Ventilation: Mask ventilation without difficulty LMA: LMA inserted LMA Size: 4.0 Tube type: Oral Number of attempts: 1 Placement Confirmation: positive ETCO2 and breath sounds checked- equal and bilateral Tube secured with: Tape Dental Injury: Teeth and Oropharynx as per pre-operative assessment

## 2022-07-29 NOTE — Brief Op Note (Signed)
07/29/2022  9:29 AM  PATIENT:  Laura [name]  61 y.o. adult  PRE-OPERATIVE DIAGNOSIS:  BUNION, HAMMERTOE,   POST-OPERATIVE DIAGNOSIS:  BUNION, HAMMERTOE,   PROCEDURE:  Procedure(s): HALLUX VALGUS (Right) HAMMER TOE CORRECTION 2ND (Right)  SURGEON:  Surgeon(s) and Role:    * Dereona Kolodny, Stephan Minister, DPM - Primary    ASSISTANTS: none   ANESTHESIA:   general  EBL:  1 mL   BLOOD ADMINISTERED:none  DRAINS: none   LOCAL MEDICATIONS USED:  MARCAINE   , LIDOCAINE , and Amount: 20 ml in 50-50 mix  SPECIMEN:  No Specimen  DISPOSITION OF SPECIMEN:  N/A  COUNTS:  YES  TOURNIQUET:   Total Tourniquet Time Documented: Calf (Right) - 62 minutes Total: Calf (Right) - 62 minutes   DICTATION: .Note written in EPIC  PLAN OF CARE: Discharge to home after PACU  PATIENT DISPOSITION:  PACU - hemodynamically stable.   Delay start of Pharmacological VTE agent (>24hrs) due to surgical blood loss or risk of bleeding: yes; restart Eliquis morning of 07/30/22

## 2022-07-29 NOTE — Telephone Encounter (Signed)
Can we clarify with patient and pharmacy. Pcp saying she is on olmesartan, patient had said she was on losartan. Can we see which she has been getting from the pharmacy  Zandra Abts MD

## 2022-07-29 NOTE — Progress Notes (Signed)
Dr Sherryle Lis at bedside.  Right 2nd toe pallor is increased from arrival to pacu.  Toe remains warm to touch.  He removed ace wrap and had pt doing ankle pumps. Color improved.  Per dr Sherryle Lis will leave ace wrap off until ready to go home and at that time will also place boot

## 2022-07-29 NOTE — Anesthesia Preprocedure Evaluation (Addendum)
Anesthesia Evaluation  Patient identified by MRN, date of birth, ID band Patient awake    Reviewed: Allergy & Precautions, NPO status , Patient's Chart, lab work & pertinent test results  History of Anesthesia Complications (+) history of anesthetic complications ("allergic reaction" to perioperative analgesics + anesthetic medications in [place]; unsure of combination; not able to recount reactions/events --> states "I ended up in the ICU for 5 days" has had anesthesia multiple times since then with no issues )  Airway Mallampati: II  TM Distance: >3 FB Neck ROM: full    Dental  (+) Teeth Intact   Pulmonary asthma , sleep apnea and Continuous Positive Airway Pressure Ventilation , Patient abstained from smoking., former smoker,    Pulmonary exam normal        Cardiovascular Exercise Tolerance: Poor hypertension, (-) angina+CHF  + dysrhythmias (complete heart block ) Atrial Fibrillation + pacemaker  Rhythm:Regular Rate:Normal  TTE (02/2022)  IMPRESSIONS    1. Left ventricular ejection fraction, by estimation, is 60 to 65%. The  left ventricle has normal function. The left ventricle has no regional  wall motion abnormalities. There is mild left ventricular hypertrophy.  Left ventricular diastolic parameters  are indeterminate.  2. Right ventricular systolic function is normal. The right ventricular  size is normal. Tricuspid regurgitation signal is inadequate for assessing  PA pressure.  3. Left atrial size was mildly dilated.  4. The mitral valve is abnormal. No evidence of mitral valve  regurgitation. Mild mitral stenosis.  5. The aortic valve has an indeterminant number of cusps. There is mild  calcification of the aortic valve. There is mild thickening of the aortic  valve. Aortic valve regurgitation is not visualized. No aortic stenosis is  present.  6. The inferior vena cava is normal in size with greater than 50%   respiratory variability, suggesting right atrial pressure of 3 mmHg.    Neuro/Psych  Headaches, PSYCHIATRIC DISORDERS Anxiety Bipolar Disorder TIA Neuromuscular disease    GI/Hepatic Neg liver ROS, GERD  Medicated,  Endo/Other  negative endocrine ROSdiabetesHypothyroidism   Renal/GU      Musculoskeletal  (+) Arthritis ,   Abdominal   Peds  Hematology  (+) Blood dyscrasia, anemia , IgE deficiency    Anesthesia Other Findings Past Medical History: No date: Anemia No date: Aortic atherosclerosis (HCC) No date: Asthma No date: Atrial fibrillation and flutter ([place])     Comment:  a.) CHA2DS2VASc = 6 (sex, HTN, TIA x2, vascular disease               history, T2DM);  b.) DCCV ~ 2009; c.) DCCV 08/29/2018               (200J x1 --> SR with 1st degree AVB); d.) rate/rhythm               maintained on oral diltiazem + bisoprolol; chronically               anticoagulated with apixaban No date: Bipolar disorder ([place]) No date: Carpal tunnel syndrome of right wrist No date: CHB (complete heart block) ([place])     Comment:  a.) s/p St. Jude dual chamber PPM placement No date: CHF (congestive heart failure) ([place]) No date: Complication of anesthesia     Comment:  a.) "allergic reaction" to perioperative analgesics +               anesthetic medications in [place]; unsure of  combination; not able to recount reactions/events -->               states "I ended up in the ICU for 5 days" No date: Constipation     Comment:  a.) on linaclotide No date: DDD (degenerative disc disease), thoracic No date: GERD (gastroesophageal reflux disease) No date: H/O congenital atrial septal defect (ASD) repair No date: Hallux valgus of right foot No date: Hammertoe of second toe of right foot No date: History of 2019 novel coronavirus disease (COVID-19)     Comment:  a.) 10/2020; b.) 05/2021 No date: HLD (hyperlipidemia) No date: HTN (hypertension) No date: Hypogammaglobulinemia ([place])      Comment:  a.) on Curitru No date: Hypothyroid 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 Jude dual chamber device; placed for symptomatic              bradycardia secondary to intermittent CHB No date: PTSD (post-traumatic stress disorder) No date: Pulmonary nodules No date: Recurrent sinusitis No date: Short-term memory loss No date: Sleep difficulties     Comment:  a.) takes melatonin No date: Symptomatic bradycardia     Comment:  a.) s/p St. Jude dual chamber PPM placement No date: Syncope No date: TIA (transient ischemic attack)     Comment:  x3-last one in 2015 No date: Tremors of nervous system No date: Type 2 diabetes mellitus ([place])  Past Surgical History: 1968: ASD REPAIR 01/26/2021: BIOPSY     Comment:  Procedure: BIOPSY;  Surgeon: [name], DO;                [facility];  Service: Endoscopy;;  gastric 2009: CARDIOVERSION 08/29/2018: CARDIOVERSION; N/A     Comment:  Procedure: CARDIOVERSION;  Surgeon: Arnoldo Lenis,               MD;  Location: [facility];  Service: Endoscopy;                Laterality: N/A; No date: CARPAL TUNNEL RELEASE; Bilateral 1986, [id number], 1994: CESAREAN SECTION 01/26/2021: COLONOSCOPY WITH PROPOFOL; N/A     Comment:  Procedure: COLONOSCOPY WITH PROPOFOL;  Surgeon: [name], DO;  [facility];  Service:               Endoscopy;  Laterality: N/A;  am appt,  diabetic 01/26/2021: ESOPHAGOGASTRODUODENOSCOPY (EGD) WITH PROPOFOL; N/A     Comment:  Procedure: ESOPHAGOGASTRODUODENOSCOPY (EGD) WITH               PROPOFOL;  Surgeon: [name], DO;  [facility];  Service: Endoscopy;  Laterality: N/A; 08/30/2019: LEFT HEART CATH AND CORONARY ANGIOGRAPHY; N/A  Comment:  Procedure: LEFT HEART CATH AND CORONARY ANGIOGRAPHY;                Surgeon: [name], MD;  Location: [facility]              CV LAB;  Service: Cardiovascular;  Laterality: N/A; 2017: NASAL SINUS SURGERY 03/03/2022: PACEMAKER IMPLANT; N/A     Comment:  Procedure: PACEMAKER IMPLANT;  Surgeon: [name],              MD;  Location: [facility] CV LAB;  Service:               Cardiovascular;  Laterality: N/A; 01/26/2021: POLYPECTOMY     Comment:  Procedure: POLYPECTOMY;  Surgeon: [name], DO;              [facility];  Service: Endoscopy;;  colon  BMI    Body Mass Index: 37.43 kg/m      Reproductive/Obstetrics negative OB ROS                         Anesthesia Physical Anesthesia Plan  ASA: 3  Anesthesia Plan: General LMA   Post-op Pain Management: Toradol IV (intra-op)* and Dilaudid IV   Induction: Intravenous  PONV Risk Score and Plan: 4 or greater and Dexamethasone, Ondansetron, Midazolam and Treatment may vary due to age or medical condition  Airway Management Planned: LMA  Additional Equipment:   Intra-op Plan:   Post-operative Plan: Extubation in OR  Informed Consent: I have reviewed the patients History and Physical, chart, labs and discussed the procedure including the risks, benefits and alternatives for the proposed anesthesia with the patient or authorized representative who has indicated his/her understanding and acceptance.     Dental Advisory Given  Plan Discussed with: Anesthesiologist, CRNA and Surgeon  Anesthesia Plan Comments: (Patient consented for  risks of anesthesia including but not limited to:  - adverse reactions to medications - damage to eyes, teeth, lips or other oral mucosa - nerve damage due to positioning  - sore throat or hoarseness - Damage to heart, brain, nerves, lungs, other parts of body or loss of life  Patient voiced understanding.)       Anesthesia Quick Evaluation

## 2022-08-01 ENCOUNTER — Encounter: Payer: Self-pay | Admitting: Podiatry

## 2022-08-01 DIAGNOSIS — E1165 Type 2 diabetes mellitus with hyperglycemia: Secondary | ICD-10-CM | POA: Diagnosis not present

## 2022-08-01 LAB — GLUCOSE, CAPILLARY: Glucose-Capillary: 238 mg/dL — ABNORMAL HIGH (ref 70–99)

## 2022-08-01 NOTE — Telephone Encounter (Signed)
More confusing than should be, stop olmesartan and start losartan '50mg'$  daily. They are same medicine essentially and no benefit of one over the other. I would just stick with the losartan '50mg'$  and stop olmesartan  Zandra Abts MD

## 2022-08-01 NOTE — Telephone Encounter (Signed)
Patient had Olmesartan filled on 9/29 through [facility] (mail order) and Losartan ( 5 tablets d/t insurance reasons) filled on 10/14 at [facility] in [place].

## 2022-08-01 NOTE — Telephone Encounter (Signed)
Patient notified and verbalized understanding. Olmesartan removed from pt's chart.

## 2022-08-03 ENCOUNTER — Ambulatory Visit: Payer: Medicare Other | Admitting: "Endocrinology

## 2022-08-03 LAB — ANA: Anti Nuclear Antibody (ANA): NEGATIVE

## 2022-08-03 LAB — PROTEIN ELECTROPHORESIS, SERUM
Albumin ELP: 3.9 g/dL (ref 3.8–4.8)
Alpha 1: 0.3 g/dL (ref 0.2–0.3)
Alpha 2: 0.8 g/dL (ref 0.5–0.9)
Beta 2: 0.3 g/dL (ref 0.2–0.5)
Beta Globulin: 0.4 g/dL (ref 0.4–0.6)
Gamma Globulin: 1.2 g/dL (ref 0.8–1.7)
Total Protein: 6.9 g/dL (ref 6.1–8.1)

## 2022-08-03 LAB — IMMUNOFIXATION ELECTROPHORESIS
IgG (Immunoglobin G), Serum: 1362 mg/dL (ref 600–1640)
IgM, Serum: 68 mg/dL (ref 50–300)
Immunoglobulin A: 49 mg/dL (ref 47–310)

## 2022-08-03 LAB — VITAMIN B1: Vitamin B1 (Thiamine): 12 nmol/L (ref 8–30)

## 2022-08-04 ENCOUNTER — Ambulatory Visit (INDEPENDENT_AMBULATORY_CARE_PROVIDER_SITE_OTHER): Payer: Medicare Other

## 2022-08-04 ENCOUNTER — Encounter: Payer: Self-pay | Admitting: Podiatry

## 2022-08-04 ENCOUNTER — Ambulatory Visit (INDEPENDENT_AMBULATORY_CARE_PROVIDER_SITE_OTHER): Payer: Medicare Other | Admitting: Podiatry

## 2022-08-04 DIAGNOSIS — S99921D Unspecified injury of right foot, subsequent encounter: Secondary | ICD-10-CM

## 2022-08-04 DIAGNOSIS — M2011 Hallux valgus (acquired), right foot: Secondary | ICD-10-CM

## 2022-08-04 DIAGNOSIS — M2041 Other hammer toe(s) (acquired), right foot: Secondary | ICD-10-CM

## 2022-08-05 NOTE — Progress Notes (Signed)
  Subjective:  Patient ID: Laura [name], adult    DOB: Jul 19, 1961,  MRN: [id number]  Chief Complaint  Patient presents with   Routine Post Op    POV #1 DOS 07/29/2022 BUNIONECTOMY RT FOOT, 2NE HAMMERTOE  CORRECTION, LIGAMENT REPAIR/TENDON TRANSFER, BONE CUT IN 2ND METATARSAL      61 y.o. adult returns for post-op check.  Overall she is doing okay she bumped the great toe a week ago removing the boot, her pain is controlled and she is no longer needing to take regular oxycodone  Review of Systems: Negative except as noted in the HPI. Denies N/V/F/Ch.   Objective:  There were no vitals filed for this visit. There is no height or weight on file to calculate BMI. Constitutional Well developed. Well nourished.  Vascular Foot warm and well perfused. Capillary refill normal to all digits.  Calf is soft and supple, no posterior calf or knee pain, negative Homans' sign  Neurologic Normal speech. Oriented to person, place, and time. Epicritic sensation to light touch grossly present bilaterally.  Dermatologic Skin healing well without signs of infection. Skin edges well coapted without signs of infection.  Orthopedic: Tenderness to palpation noted about the surgical site.   Multiple view plain film radiographs: 2 screw fixation noted of bunionectomy of the left foot with good correction, Kirschner wire intact for second hammertoe correction and capsulotomy positioning Assessment:   1. Hallux valgus (acquired), right foot   2. Hammer toe of right foot    Plan:  Patient was evaluated and treated and all questions answered.  S/p foot surgery right -Progressing as expected post-operatively. -XR: Noted above, we will take new x-rays at her third postoperative visit -WB Status: She may be weightbearing as tolerated in cam walker boot -Sutures: Plan to remove in 2 weeks she will return to see the nurse for this.  Following suture removal may resume regular showers and apply Neosporin around  pin site before and after showering -Medications: No refills required she has resumed her Eliquis -Foot redressed.  Return in about 2 weeks (around 08/18/2022) for suture removal.

## 2022-08-08 ENCOUNTER — Other Ambulatory Visit: Payer: Self-pay

## 2022-08-08 ENCOUNTER — Telehealth: Payer: Self-pay | Admitting: *Deleted

## 2022-08-08 ENCOUNTER — Telehealth: Payer: Self-pay | Admitting: Neurology

## 2022-08-08 NOTE — Telephone Encounter (Signed)
Called pt and talked to her about the medicine and she reported that she had not looked in the box that came with meds. She will check and call back tomorrow.

## 2022-08-08 NOTE — Telephone Encounter (Signed)
Patient called regarding PREP. Wants to participate in 09/13/2022 Tuesday/ Thursday 1130-1245 class at the [facility]. Will contact mid November to set up intake assessment.

## 2022-08-08 NOTE — Telephone Encounter (Signed)
1. Which medications need refilled? (List name and dosage, if known) topirmate 100 mg  2. Which pharmacy/location is medication to be sent to? (include street and city if local pharmacy) [place]  3. Do they need a 30 day or 90 day supply? [place]

## 2022-08-09 ENCOUNTER — Other Ambulatory Visit: Payer: Self-pay

## 2022-08-09 DIAGNOSIS — R251 Tremor, unspecified: Secondary | ICD-10-CM

## 2022-08-09 DIAGNOSIS — D839 Common variable immunodeficiency, unspecified: Secondary | ICD-10-CM | POA: Diagnosis not present

## 2022-08-09 MED ORDER — TOPIRAMATE 100 MG PO TABS: 100.0000 mg | ORAL_TABLET | Freq: Two times a day (BID) | ORAL | 1 refills | Status: DC

## 2022-08-09 NOTE — Telephone Encounter (Signed)
Patient left message saying she is not able to get her Topiramate 100 mg from the pharmacy.

## 2022-08-13 DIAGNOSIS — G4733 Obstructive sleep apnea (adult) (pediatric): Secondary | ICD-10-CM | POA: Diagnosis not present

## 2022-08-15 ENCOUNTER — Other Ambulatory Visit: Payer: Self-pay | Admitting: Gastroenterology

## 2022-08-15 NOTE — Telephone Encounter (Signed)
Needs ov

## 2022-08-16 ENCOUNTER — Encounter: Payer: Self-pay | Admitting: Allergy & Immunology

## 2022-08-16 ENCOUNTER — Other Ambulatory Visit: Payer: Self-pay | Admitting: Pulmonary Disease

## 2022-08-17 ENCOUNTER — Ambulatory Visit (INDEPENDENT_AMBULATORY_CARE_PROVIDER_SITE_OTHER): Payer: Medicare Other | Admitting: Podiatry

## 2022-08-17 DIAGNOSIS — M2011 Hallux valgus (acquired), right foot: Secondary | ICD-10-CM

## 2022-08-17 NOTE — Progress Notes (Signed)
Patient seen today at the office for POV #2 DOS 07/29/2022 BUNIONECTOMY RT FOOT, 2NE HAMMERTOE CORRECTION, LIGAMENT REPAIR/TENDON TRANSFER, BONE CUT IN 2ND METATARSAL .  Patient denied any fever, nausea, vomiting, chills, chest pain at this time. No sign of infection noted. Sutures were removed per providers orders. Patient tolerated procedure well without complications. Pin intact at this time.   Patient was notified that she is able to take showers but to ensure that she applies Neosporin near the pin site before and after the shower. Patient is to cover foot with ace bandage. Patient is to continue using the cam boot as ordered. Patient will follow up with provider at next visit.   No questions or concerns at this time.

## 2022-08-18 ENCOUNTER — Encounter: Payer: Medicare Other | Admitting: Podiatry

## 2022-08-25 ENCOUNTER — Ambulatory Visit (INDEPENDENT_AMBULATORY_CARE_PROVIDER_SITE_OTHER): Payer: Medicare Other | Admitting: Clinical

## 2022-08-25 DIAGNOSIS — F4310 Post-traumatic stress disorder, unspecified: Secondary | ICD-10-CM

## 2022-08-25 NOTE — Progress Notes (Signed)
Virtual Visit via Video Note   I connected with Laura [name] on 08/25/2022 at  11:00 AM EST by a video enabled telemedicine application and verified that I am speaking with the correct person using two identifiers.   Location: Patient: Home Provider: Office   I discussed the limitations of evaluation and management by telemedicine and the availability of in person appointments. The patient expressed understanding and agreed to proceed.   THERAPIST PROGRESS NOTE   Session Time: 11:00 AM-:11:45 AM   Participation Level: Active   Behavioral Response: CasualAlertDepressed   Type of Therapy: Individual Therapy   Treatment Goals addressed: Coping   Interventions: CBT, DBT, Solution Focused, Strength-based and Supportive   Summary: Laura [name] is a 61 y.o. female who presents with PTSD. The OPT therapist worked with the patient for her ongoing OPT treatment session. The OPT therapist utilized Motivational Interviewing to assist in creating therapeutic repore.The patient in the session was engaged and work in collaboration giving feedback about her triggers and symptoms over the past few weeks.The patient spoke about recovery from her foot surgery and the impact of her loss of mobility during her recovery process. The OPT therapist utilized Cognitive Behavioral Therapy through cognitive restructuring as well as worked with the patient on coping strategies to assist in management of mental health symptoms. The patient verbalized, " Its just really hard being stuck at home and not being able to get around, I wasn't able to go to my revival at [place]".  The patient spoke about the impact of her surgery and recovery on her mental health. The OPT therapist worked with the patient on her adjustment and coping strategies during her recovery process from recent foot surgery. The patient spoke about the down time leading her to thinking a lot recently and this has negatively impacted her mood. The OPT  therapist placed emphasis on the patient keeping all upcoming health appointments and adhering to ongoing health treatment recommendations. The OPT therapist reviewed all appointments listed coming up in the patients mychart.    Suicidal/Homicidal: Nowithout intent/plan   Therapist Response:The OPT therapist worked with the patient for the patients scheduled session. The patient was engaged in her session and gave feedback in relation to triggers, symptoms, and behavior responses over the past few weeks. The OPT therapist worked with the patient utilizing an in session Cognitive Behavioral Therapy exercise.  The patient spoke about impact foot surgery and lack of mobility over the past few weeks which has effected the patients exercising, eating habits, and sleep routine. The patient spoke about her expectations with her ongoing recovery post foot surgery.The patient spoke about the next step in her recovery process being November 30th for her follow up appointment. The patient notes currently she is not able to drive due to needing to keep her foot elevated as well as having recently to sleep on the recliner so that the pin in her foot is not effected. The patient noted that around Dec 5th she will be allowed to return to some of her physical health exercise and she will be doing a program at the local [employer] on Tuesdays and Thursdays.The patient will be doing the program for 2 months focused primarily on upper body but will eventually start to incorporate lower body. If the patient is cleared in November on the 30th. The patient will be incorporating crossword puzzles, crochet, painting, cooking, and watching Tv. The patient spoke about her interactions with her daughters and this as of recent has ben going  well and not a additional stressor.The OPT therapist will continue treatment work with the patient in her next scheduled session.   Plan: Return again in 2/3 weeks.   Diagnosis:      Axis I:Post Traumatic  Stress Disorder                            Axis II: No diagnosis     Collaboration of Care: No additional collaboration for this scheduled session.   Patient/Guardian was advised Release of Information must be obtained prior to any record release in order to collaborate their care with an outside provider. Patient/Guardian was advised if they have not already done so to contact the registration department to sign all necessary forms in order for [place] to release information regarding their care.    Consent: Patient/Guardian gives verbal consent for treatment and assignment of benefits for services provided during this visit. Patient/Guardian expressed understanding and agreed to proceed.      I discussed the assessment and treatment plan with the patient. The patient was provided an opportunity to ask questions and all were answered. The patient agreed with the plan and demonstrated an understanding of the instructions.   The patient was advised to call back or seek an in-person evaluation if the symptoms worsen or if the condition fails to improve as anticipated.   I provided 45 minutes of non-face-to-face time during this encounter.   [name], LCSW   08/25/2022

## 2022-08-27 DIAGNOSIS — G4733 Obstructive sleep apnea (adult) (pediatric): Secondary | ICD-10-CM | POA: Diagnosis not present

## 2022-08-29 ENCOUNTER — Ambulatory Visit (INDEPENDENT_AMBULATORY_CARE_PROVIDER_SITE_OTHER): Payer: Medicare Other

## 2022-08-29 ENCOUNTER — Encounter: Payer: Self-pay | Admitting: Podiatry

## 2022-08-29 ENCOUNTER — Telehealth: Payer: Self-pay | Admitting: *Deleted

## 2022-08-29 ENCOUNTER — Ambulatory Visit (INDEPENDENT_AMBULATORY_CARE_PROVIDER_SITE_OTHER): Payer: Medicare Other | Admitting: Podiatry

## 2022-08-29 DIAGNOSIS — M2041 Other hammer toe(s) (acquired), right foot: Secondary | ICD-10-CM

## 2022-08-29 DIAGNOSIS — M2011 Hallux valgus (acquired), right foot: Secondary | ICD-10-CM

## 2022-08-29 NOTE — Telephone Encounter (Signed)
Patient is calling because her pin seems to be coming out, is painful,noticed it 1 day ago, please advise.

## 2022-08-29 NOTE — Progress Notes (Signed)
  Subjective:  Patient ID: Laura [name], adult    DOB: [birth year],  MRN: [id number]  Chief Complaint  Patient presents with   Post-op Problem    Pin is coming out of the toe causing discomfort       61 y.o. adult returns for post-op check.  She has had worsening pain feels that the pain is coming out  Review of Systems: Negative except as noted in the HPI. Denies N/V/F/Ch.   Objective:  There were no vitals filed for this visit. There is no height or weight on file to calculate BMI. Constitutional Well developed. Well nourished.  Vascular Foot warm and well perfused. Capillary refill normal to all digits.  Calf is soft and supple, no posterior calf or knee pain, negative Homans' sign  Neurologic Normal speech. Oriented to person, place, and time. Epicritic sensation to light touch grossly present bilaterally.  Dermatologic Incisions are well-healed there is some scab left  Orthopedic: Mild edema and minimal tenderness to palpation noted about the surgical site.   Multiple view plain film radiographs: Good healing across osteotomy and arthrodesis sites, pin is intact but has translated distally Assessment:   1. Hallux valgus (acquired), right foot   2. Hammer toe of right foot    Plan:  Patient was evaluated and treated and all questions answered.  S/p foot surgery right -New x-rays taken today show translation of the pin.  I recommended removal of this today prior to her next visit.  I will see her back in 10 days for follow-up as scheduled we will transition from the cam walker boot to a surgical postop shoe and she can begin driving at that point 2.  Kirschner wire was removed uneventfully.  No follow-ups on file.

## 2022-08-31 ENCOUNTER — Telehealth: Payer: Self-pay | Admitting: *Deleted

## 2022-08-31 DIAGNOSIS — D839 Common variable immunodeficiency, unspecified: Secondary | ICD-10-CM | POA: Diagnosis not present

## 2022-08-31 NOTE — Telephone Encounter (Signed)
PREP Class assessment visit scheduled for 09/13/2022 at 1245, after first class. Patient unable to drive until that week following foot surgery.

## 2022-09-01 DIAGNOSIS — E1165 Type 2 diabetes mellitus with hyperglycemia: Secondary | ICD-10-CM | POA: Diagnosis not present

## 2022-09-02 ENCOUNTER — Other Ambulatory Visit: Payer: Self-pay | Admitting: Pulmonary Disease

## 2022-09-05 DIAGNOSIS — G4733 Obstructive sleep apnea (adult) (pediatric): Secondary | ICD-10-CM | POA: Diagnosis not present

## 2022-09-06 ENCOUNTER — Ambulatory Visit: Payer: Medicare Other | Admitting: Gastroenterology

## 2022-09-06 ENCOUNTER — Ambulatory Visit (INDEPENDENT_AMBULATORY_CARE_PROVIDER_SITE_OTHER): Payer: Medicare Other

## 2022-09-06 DIAGNOSIS — I495 Sick sinus syndrome: Secondary | ICD-10-CM | POA: Diagnosis not present

## 2022-09-06 LAB — CUP PACEART REMOTE DEVICE CHECK
Battery Remaining Longevity: 91 mo
Battery Remaining Percentage: 95.5 %
Battery Voltage: 3.01 V
Brady Statistic AP VP Percent: 26 %
Brady Statistic AP VS Percent: 1 %
Brady Statistic AS VP Percent: 16 %
Brady Statistic AS VS Percent: 56 %
Brady Statistic RA Percent Paced: 27 %
Brady Statistic RV Percent Paced: 42 %
Date Time Interrogation Session: 20231128020012
Implantable Lead Connection Status: 753985
Implantable Lead Connection Status: 753985
Implantable Lead Implant Date: 20230525
Implantable Lead Implant Date: 20230525
Implantable Lead Location: 753859
Implantable Lead Location: 753860
Implantable Pulse Generator Implant Date: 20230525
Lead Channel Impedance Value: 440 Ohm
Lead Channel Impedance Value: 450 Ohm
Lead Channel Pacing Threshold Amplitude: 0.625 V
Lead Channel Pacing Threshold Pulse Width: 0.5 ms
Lead Channel Sensing Intrinsic Amplitude: 1.9 mV
Lead Channel Sensing Intrinsic Amplitude: 11.5 mV
Lead Channel Setting Pacing Amplitude: 0.875
Lead Channel Setting Pacing Amplitude: 3.5 V
Lead Channel Setting Pacing Pulse Width: 0.5 ms
Lead Channel Setting Sensing Sensitivity: 0.5 mV
Pulse Gen Model: 2272
Pulse Gen Serial Number: 8084453

## 2022-09-08 ENCOUNTER — Ambulatory Visit (INDEPENDENT_AMBULATORY_CARE_PROVIDER_SITE_OTHER): Payer: Medicare Other | Admitting: Podiatry

## 2022-09-08 ENCOUNTER — Ambulatory Visit (INDEPENDENT_AMBULATORY_CARE_PROVIDER_SITE_OTHER): Payer: Medicare Other

## 2022-09-08 ENCOUNTER — Encounter: Payer: Self-pay | Admitting: Podiatry

## 2022-09-08 DIAGNOSIS — S99921D Unspecified injury of right foot, subsequent encounter: Secondary | ICD-10-CM

## 2022-09-08 DIAGNOSIS — M2041 Other hammer toe(s) (acquired), right foot: Secondary | ICD-10-CM

## 2022-09-08 DIAGNOSIS — M2011 Hallux valgus (acquired), right foot: Secondary | ICD-10-CM | POA: Diagnosis not present

## 2022-09-08 NOTE — Progress Notes (Signed)
  Subjective:  Patient ID: Laura [name], adult    DOB: 27-Jul-1961,  MRN: [id number]  Chief Complaint  Patient presents with   Routine Post Op    (xray)POV #3 DOS 07/29/2022 BUNIONECTOMY RT FOOT, 2NE HAMMERTOE CORRECTION, LIGAMENT REPAIR/TENDON TRANSFER, BONE CUT IN 2ND METATARSAL      61 y.o. adult returns for post-op check.  Doing better with pain of the pins out  Review of Systems: Negative except as noted in the HPI. Denies N/V/F/Ch.   Objective:  There were no vitals filed for this visit. There is no height or weight on file to calculate BMI. Constitutional Well developed. Well nourished.  Vascular Foot warm and well perfused. Capillary refill normal to all digits.  Calf is soft and supple, no posterior calf or knee pain, negative Homans' sign  Neurologic Normal speech. Oriented to person, place, and time. Epicritic sensation to light touch grossly present bilaterally.  Dermatologic Incisions are well-healed there is some scab left, improved  Orthopedic: Edema improved, still has some tenderness and swelling in the toe, some stiffness in both MPJs   Multiple view plain film radiographs: Good stability of osteotomy with unchanged screw fixation early bridging noted and peripheral bone callus, good bridging across second toe fusion site Assessment:   1. Hallux valgus (acquired), right foot   2. Hammer toe of right foot   3. Injury of plantar plate of right foot, subsequent encounter    Plan:  Patient was evaluated and treated and all questions answered.  S/p foot surgery right -Overall doing better now that the pin has been removed.  She shows good early signs of bone healing she has continued bone healing that needs to occur still.  I recommend we transition her to a surgical shoe for the next 3 to 4 weeks and gradually after that we can begin to wear a supportive shoe or sneaker.  She may begin to drive as well gradually.  Okay for her to return to the gym next week but I  will do not want her to be exercising on the foot yet.  I will see her back in 6 weeks for new x-rays advised if any worsening signs or symptoms to let me know.  She should resume her regular bathing and lotion application and apply Neosporin to the small amount of scab left expect this will heal uneventfully  Return in about 6 weeks (around 10/20/2022) for post op (new x-rays).

## 2022-09-09 ENCOUNTER — Telehealth: Payer: Self-pay | Admitting: "Endocrinology

## 2022-09-09 NOTE — Telephone Encounter (Signed)
Patient called and left a VM stating that she has been having trouble with her [employer]. She thought it was [employer] that she got this through and she called them and it is not. They told her to call us to see who her diabetes supply company is. I asked her to call her insurance company to see in the mean time.

## 2022-09-12 ENCOUNTER — Ambulatory Visit: Payer: Medicare Other | Attending: Pulmonary Disease | Admitting: Pulmonary Disease

## 2022-09-12 DIAGNOSIS — G4761 Periodic limb movement disorder: Secondary | ICD-10-CM | POA: Insufficient documentation

## 2022-09-12 DIAGNOSIS — R0683 Snoring: Secondary | ICD-10-CM | POA: Diagnosis not present

## 2022-09-12 DIAGNOSIS — I493 Ventricular premature depolarization: Secondary | ICD-10-CM | POA: Diagnosis not present

## 2022-09-12 DIAGNOSIS — G4733 Obstructive sleep apnea (adult) (pediatric): Secondary | ICD-10-CM

## 2022-09-13 ENCOUNTER — Ambulatory Visit (INDEPENDENT_AMBULATORY_CARE_PROVIDER_SITE_OTHER): Payer: Medicare Other | Admitting: Gastroenterology

## 2022-09-13 ENCOUNTER — Encounter: Payer: Self-pay | Admitting: Gastroenterology

## 2022-09-13 VITALS — BP 153/74 | HR 68 | Temp 97.9°F | Ht 64.0 in | Wt 218.8 lb

## 2022-09-13 DIAGNOSIS — K219 Gastro-esophageal reflux disease without esophagitis: Secondary | ICD-10-CM

## 2022-09-13 DIAGNOSIS — K5900 Constipation, unspecified: Secondary | ICD-10-CM

## 2022-09-13 MED ORDER — LINACLOTIDE 145 MCG PO CAPS: ORAL_CAPSULE | ORAL | 3 refills | Status: DC

## 2022-09-13 MED ORDER — OMEPRAZOLE 20 MG PO CPDR: DELAYED_RELEASE_CAPSULE | ORAL | 3 refills | Status: DC

## 2022-09-13 NOTE — Procedures (Signed)
Patient Name: [name], Laura [name] Date: 09/12/2022 Gender: Female D.O.B: 1961/07/10 Age (years): 61 Referring Provider: Simonne Maffucci Height (inches): 9 Interpreting Physician: Kara Mead MD, ABSM Weight (lbs): 210 RPSGT: Rosebud Poles BMI: 36 MRN: [id number] Neck Size: 18.50 <br> <br> CLINICAL INFORMATION Sleep Study Type: NPSG    Indication for sleep study: Excessive Daytime Sleepiness, OSA    Epworth Sleepiness Score: 9     Most recent polysomnogram dated 11/06/2018 revealed an AHI of 11.4/h and RDI of 11.7/h. SLEEP STUDY TECHNIQUE As per the AASM Manual for the Scoring of Sleep and Associated Events v2.3 (April 2016) with a hypopnea requiring 4% desaturations.  The channels recorded and monitored were frontal, central and occipital EEG, electrooculogram (EOG), submentalis EMG (chin), nasal and oral airflow, thoracic and abdominal wall motion, anterior tibialis EMG, snore microphone, electrocardiogram, and pulse oximetry.  MEDICATIONS Medications self-administered by patient taken the night of the study : Divalproex ER, GABAPENTIN, LANTUS, MONTELUKAST  SLEEP ARCHITECTURE The study was initiated at 10:15:50 PM and ended at 5:06:49 AM.  Sleep onset time was 11.8 minutes and the sleep efficiency was 86.7%. The total sleep time was 356.2 minutes.  Stage REM latency was 122.0 minutes.  The patient spent 8.84% of the night in stage N1 sleep, 62.94% in stage N2 sleep, 16.00% in stage N3 and 12.2% in REM.  Alpha intrusion was absent.  Supine sleep was 21.22%.  RESPIRATORY PARAMETERS The overall apnea/hypopnea index (AHI) was 2.2 per hour. There were 0 total apneas, including 0 obstructive, 0 central and 0 mixed apneas. There were 13 hypopneas and 0 RERAs.  The AHI during Stage REM sleep was 4.1 per hour.  AHI while supine was 4.0 per hour.  The mean oxygen saturation was 94.99%. The minimum SpO2 during sleep was 89.00%.  moderate snoring was noted during  this study.  CARDIAC DATA The 2 lead EKG demonstrated pacemaker generated. The mean heart rate was 44.27 beats per minute. Other EKG findings include: PVCs.   LEG MOVEMENT DATA The total PLMS were 306 with a resulting PLMS index of 51.54. Associated arousal with leg movement index was 6.2 .  IMPRESSIONS - No significant obstructive sleep apnea occurred during this study (AHI = 2.2/h). - The patient had minimal or no oxygen desaturation during the study (Min O2 = 89.00%) - The patient snored with moderate snoring volume. - EKG findings include PVCs. - Severe periodic limb movements of sleep occurred during the study. Associated arousals were significant.   DIAGNOSIS - Periodic Limb Movement During Sleep (G47.61) RECOMMENDATIONS - Mirapex, Requip, or Sinemet for treatment of Periodic Leg Movements of Sleep. - Avoid alcohol, sedatives and other CNS depressants that may worsen sleep apnea and disrupt normal sleep architecture. - Sleep hygiene should be reviewed to assess factors that may improve sleep quality. - Weight management and regular exercise should be initiated or continued if appropriate.   Kara Mead MD Board Certified in [place]

## 2022-09-13 NOTE — Patient Instructions (Addendum)
Try stopping Reglan. If you notice frequent nausea, increasing heartburn let me know otherwise you can stay off the medication.  If you tolerate coming off Reglan after a few weeks, then try decreasing omeprazole to once daily before breakfast.  Continue Linzess once daily for constipation.  Monitor your blood pressure, if remains over 140/90 please contact your PCP. Return to the office in one year or sooner as needed.

## 2022-09-13 NOTE — Progress Notes (Signed)
GI Office Note    Referring Provider: [name], MD Primary Care Physician:  [name], MD  Primary Gastroenterologist: Elon Alas. Abbey Chatters, DO   Chief Complaint   Chief Complaint  Patient presents with   Follow-up    Pt still deals with constipation off and on    History of Present Illness   Laura [name] is a 61 y.o. adult presenting today for follow up office visit. Last seen July 2022. H/o constipation, GERD/dysphagia/gastritis.   Had to get a pacemaker this year after multiple episodes of syncope. Recovering from foot surgery now. From a GI standpoint Laura [name] has been doing well. Laura [name] is still on Reglan and Laura [name] is not sure why. We discussed stopping a year ago due to risk of tardive dyskinesia. Still getting refills from PCP. Laura [name] denies heartburn. Has some dysphagia to pills but washes down with extra swallows. Does better taking medications with milk. BMs doing ok on Linzess daily. Rarely takes miralax. No melena, brbpr. No abdominal pain except for prior to BM. No N/V.  BPE 04/2021: No definite mass or stricture in esophagus. Small sliding type hiatal hernia. 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   EGD 01/2021: Gastritis-reactive gastropathy   Colonoscopy 01/2021: Non-bleeding internal hemorrhoids. Diverticulosis Two 5-8 mm ascending colon polyps -path tubular adenomas Next colonoscopy in five years  Medications   Current Outpatient Medications  Medication Sig Dispense Refill   albuterol (PROVENTIL) (2.5 MG/3ML) 0.083% nebulizer solution Take 3 mLs (2.5 mg total) by nebulization every 4 (four) hours as needed for wheezing or shortness of breath. 75 mL 1   albuterol (VENTOLIN HFA) 108 (90 Base) MCG/ACT inhaler INHALE 2 PUFFS INTO THE LUNGS EVERY 6 HOURS AS NEEDED FOR WHEEZING OR SHORTNESS OF BREATH 18 g 1   apixaban (ELIQUIS) 5 MG TABS tablet TAKE ONE TABLET BY MOUTH TWICE DAILY _0  & 5PM (Patient taking differently: Take  5 mg by mouth 2 (two) times daily.) 180 tablet 1   atorvastatin (LIPITOR) 10 MG tablet Take 10 mg by mouth at bedtime.     BD PEN NEEDLE NANO 2ND GEN 32G X 4 MM MISC 1 each by Other route in the morning, at noon, in the evening, and at bedtime. 90 each 0   Benralizumab (FASENRA PEN) 30 MG/ML SOAJ Inject 1 mL (30 mg total) into the skin every 28 (twenty-eight) days. Then every 8 weeks 1 mL 8   bisoprolol (ZEBETA) 10 MG tablet Take 1 tablet (10 mg total) by mouth daily. (Patient taking differently: Take 10 mg by mouth every morning.) 90 tablet 3   blood glucose meter kit and supplies 1 each by Other route 4 (four) times daily. Dispense based on patient and insurance preference. Use up to four times daily as directed. (FOR ICD-10 E10.9, E11.9). 1 each 0   BREO ELLIPTA 100-25 MCG/ACT AEPB INHALE 1 PUFF INTO LUNGS DAILY (BULK) 60 each 5   Cholecalciferol (VITAMIN D3) 125 MCG (5000 UT) CAPS Take 1 capsule (5,000 Units total) by mouth daily. 90 capsule 0   CINNAMON PO Take 1 tablet by mouth daily at 6 (six) AM.     Continuous Blood Gluc Sensor (FREESTYLE LIBRE 3 SENSOR) MISC 1 Piece by Does not apply route every 14 (fourteen) days. Place 1 sensor on the skin every 14 days. Use to check glucose continuously 6 each 2   CUVITRU 1 GM/5ML SOLN Inject into the skin.     CUVITRU 10 GM/50ML SOLN  Inject 25 mg into the skin every 14 (fourteen) days.     CUVITRU 2 GM/10ML SOLN Inject into the skin.     CUVITRU 4 GM/20ML SOLN Inject into the skin.     diltiazem (CARDIZEM CD) 180 MG 24 hr capsule Take 180 mg by mouth daily.     diltiazem (CARDIZEM CD) 300 MG 24 hr capsule Take 1 capsule (300 mg total) by mouth daily. (Patient taking differently: Take 300 mg by mouth every morning.) 90 capsule 3   divalproex (DEPAKOTE ER) 500 MG 24 hr tablet Take 1 tablet (500 mg total) by mouth daily AND 2 tablets (1,000 mg total) at bedtime. (Patient taking differently: Take 1 tablet (500 mg total) in AM  AND 2 tablets (1,500 mg total)  at bedtime.) 270 tablet 0   ELDERBERRY PO Take 4 g by mouth daily. Gummies 2g each     EPINEPHrine 0.3 mg/0.3 mL IJ SOAJ injection Inject 0.3 mg into the muscle once as needed for anaphylaxis.     ferrous sulfate 324 MG TBEC Take 324 mg by mouth every evening.     gabapentin (NEURONTIN) 600 MG tablet Take 600 mg by mouth 3 (three) times daily.     hydrOXYzine (ATARAX) 25 MG tablet Take 25 mg by mouth every 8 (eight) hours as needed.     Immune Globulin, Human, (CUVITRU [place]) Inject 27 mg into the skin every 14 (fourteen) days.     Insulin Pen Needle (PEN NEEDLES 3/16") 31G X 5 MM MISC Use as directed with insulin pen 100 each 0   LANTUS SOLOSTAR 100 UNIT/ML Solostar Pen INJECT 70 UNITS SUBCUTANEOUSLY AT BEDTIME (BULK) (Patient taking differently: 50 Units at bedtime.) 30 mL 2   levalbuterol (XOPENEX HFA) 45 MCG/ACT inhaler Inhale 2 puffs into the lungs every 6 (six) hours as needed.     levalbuterol (XOPENEX) 1.25 MG/3ML nebulizer solution Inhale 1.25 mg into the lungs daily as needed (asthma).     levothyroxine (SYNTHROID, LEVOTHROID) 112 MCG tablet Take 112 mcg by mouth daily before breakfast.     linaclotide (LINZESS) 145 MCG CAPS capsule TAKE ONE CAPSULE BY MOUTH DAILY BEFORE BREAKFAST, OFFICE VISIT NEEDED BEFORE FUTURE REFILLS (ORIG) 90 capsule 1   losartan (COZAAR) 50 MG tablet Take 50 mg by mouth every morning.     MELATONIN GUMMIES PO Take 10 mg by mouth at bedtime.     metFORMIN (GLUCOPHAGE) 500 MG tablet Take 500 mg by mouth 2 (two) times daily.     metoCLOPramide (REGLAN) 5 MG tablet Take 5 mg by mouth 3 (three) times daily.     montelukast (SINGULAIR) 10 MG tablet Take 10 mg by mouth at bedtime.     Multiple Vitamins-Minerals (MULTIVITAMIN WITH MINERALS) tablet Take 1 tablet by mouth daily. Woman     omeprazole (PRILOSEC) 20 MG capsule TAKE 1 CAPSULE(20 MG) BY MOUTH TWICE DAILY BEFORE A MEAL (Patient taking differently: 20 mg 2 (two) times daily before a meal.) 30 capsule 5    ondansetron (ZOFRAN-ODT) 8 MG disintegrating tablet Take 8 mg by mouth daily as needed for vomiting or nausea.     ONETOUCH ULTRA test strip 1 each by Other route in the morning, at noon, in the evening, and at bedtime.     polyethylene glycol (MIRALAX / GLYCOLAX) 17 g packet Take 17 g by mouth daily as needed. (Patient taking differently: Take 17 g by mouth daily as needed for moderate constipation or severe constipation.) 14 each 0   Potassium  Chloride ER 20 MEQ TBCR Take 20 mEq by mouth daily.     Spacer/Aero-Holding Chambers (AEROCHAMBER PLUS) inhaler 1 each by Other route as needed for other. Use as instructed 1 each 0   topiramate (TOPAMAX) 100 MG tablet Take 1 tablet (100 mg total) by mouth 2 (two) times daily. 150 mg in am and 100 mg at night 180 tablet 1   topiramate (TOPAMAX) 50 MG tablet Take 1 tablet (50 mg total) by mouth daily. 90 tablet 1   torsemide (DEMADEX) 20 MG tablet Take 1 tablet daily for fluid gain of 3 lb overnight or 5 lbs in a week. (Patient taking differently: Take 20 mg by mouth as needed. Take 1 tablet daily for fluid gain of 3 lb overnight or 5 lbs in a week.) 90 tablet 3   triamcinolone cream (KENALOG) 0.1 % Apply 1 application. topically daily as needed (foot).     TRULICITY 3 ZD/6.3OV SOPN INJECT 3MG AS DIRECTED ONCE A WEEK (BULK) 6 mL 0   TURMERIC PO Take 1 tablet by mouth daily at 6 (six) AM.     vitamin B-12 (CYANOCOBALAMIN) 1000 MCG tablet Take 1,000 mcg by mouth daily.     vitamin C (ASCORBIC ACID) 500 MG tablet Take 500 mg by mouth daily. Power C immune support     Nebulizer MISC Nebulizer tubing kit 2 each 5   Current Facility-Administered Medications  Medication Dose Route Frequency Provider Last Rate Last Admin   Benralizumab SOSY 30 mg  30 mg Subcutaneous Q28 days Dara Hoyer, FNP   30 mg at 12/27/21 1603    Allergies   Allergies as of 09/13/2022 - Review Complete 09/13/2022  Allergen Reaction Noted   Ativan [lorazepam] Hives 02/06/2018    Phenergan [promethazine hcl] Hives 02/06/2018       Review of Systems   General: Negative for anorexia, weight loss, fever, chills, fatigue, weakness. ENT: Negative for hoarseness, difficulty swallowing , nasal congestion.see hpi CV: Negative for chest pain, angina, palpitations, dyspnea on exertion, peripheral edema.  Respiratory: Negative for dyspnea at rest, dyspnea on exertion, cough, sputum, wheezing.  GI: See history of present illness. GU:  Negative for dysuria, hematuria, urinary incontinence, urinary frequency, nocturnal urination.  Endo: Negative for unusual weight change.     Physical Exam   BP (!) 153/74   Pulse 68   Temp 97.9 F (36.6 C)   Ht _0  (1.626 m)   Wt 218 lb 12.8 oz (99.2 kg)   BMI 37.56 kg/m    General: Well-nourished, well-developed in no acute distress.  Eyes: No icterus. Mouth: Oropharyngeal mucosa moist and pink , no lesions erythema or exudate. Abdomen: Bowel sounds are normal, nontender, nondistended, no hepatosplenomegaly or masses,  no abdominal bruits or hernia , no rebound or guarding.  Rectal: not performed Extremities: No lower extremity edema. No clubbing or deformities. Neuro: Alert and oriented x 4   Skin: Warm and dry, no jaundice.   Psych: Alert and cooperative, normal mood and affect.  Labs   Lab Results  Component Value Date   CREATININE 0.67 07/22/2022   BUN 23 (H) 07/22/2022   NA 139 07/22/2022   K 4.0 07/22/2022   CL 112 (H) 07/22/2022   CO2 19 (L) 07/22/2022   Lab Results  Component Value Date   ALT 14 07/19/2022   AST 13 07/19/2022   ALKPHOS 93 07/19/2022   BILITOT 0.2 07/11/2022   Lab Results  Component Value Date   WBC 5.8 07/22/2022  HGB 14.2 07/22/2022   HCT 40.2 07/22/2022   MCV 93.3 07/22/2022   PLT 167 07/22/2022   Lab Results  Component Value Date   VITAMINB12 1,488 (H) 07/27/2022   Lab Results  Component Value Date   TSH 1.83 07/27/2022   Lab Results  Component Value Date   FOLATE 17.1  07/27/2022    Imaging Studies   DG Foot Complete Right  Result Date: 09/09/2022 Please see detailed radiograph report in office note.  CUP PACEART REMOTE DEVICE CHECK  Result Date: 09/06/2022 Scheduled remote reviewed. Normal device function.  AF burden 14% (DDIR) OAC- Eliquis Next remote 91 days- JJB  DG Foot Complete Right  Result Date: 08/29/2022 Please see detailed radiograph report in office note.   Assessment   GERD/gastritis/dysphagia: doing well at this time. Again encouraged Laura [name] to try coming off of Reglan. If tolerated, then over the next month or so Laura [name] will try to decrease PPI to once daily dosing.   Constipation: Doing well on Linzess.   The patient was found to have elevated blood pressure when vital signs were checked in the office. The blood pressure was rechecked by the nursing staff and it was found be persistently elevated >140/90 mmHg. I personally advised to the patient to follow up closely with his PCP for hypertension control.    PLAN   Stop Reglan, monitor for nausea, reflux. If tolerates then try decreasing omeprazole to once daily dosing.   Continue Linzess daily.  Return to the office in one year or sooner if needed.      Laureen Ochs. Bobby Rumpf, [place], [facility]

## 2022-09-14 ENCOUNTER — Encounter: Payer: Self-pay | Admitting: *Deleted

## 2022-09-14 ENCOUNTER — Other Ambulatory Visit: Payer: Self-pay | Admitting: Psychiatry

## 2022-09-14 DIAGNOSIS — D839 Common variable immunodeficiency, unspecified: Secondary | ICD-10-CM | POA: Diagnosis not present

## 2022-09-14 NOTE — Progress Notes (Signed)
[facility] PREP Evaluation  Patient Details  Name: Laura [name] MRN: [id number] Date of Birth: 1961/03/16 Age: 61 y.o. PCP: [name], MD  Vitals:   09/13/22 1330  BP: 136/76  Pulse: 85  Resp: (!) 22  SpO2: 98%  Weight: 216 lb 9.6 oz (98.2 kg)     [facility] Eval - 09/13/22 1330       [facility] "PREP" Location   [facility] "PREP" Location [place]      Referral    Referring Provider Elsworth Soho    Reason for referral Hypertension;Inactivity;Obesitity/Overweight;High Cholesterol;Diabetes    Program Start Date 09/13/22      Measurement   Waist Circumference 46.25 inches    Hip Circumference 52 inches      Information for Trainer   Goals Establish an exercise routine, weight loss 10-12 lbs in 12 weeks,more consistently eating healthy    Current Exercise none    Orthopedic Concerns Right foot surgery, still in ortho shoe, degenerative disc disease low back   self reported   Current Barriers orthopedic shoe right foot until mid January    Restrictions/Precautions Diabetic snack before exercise    Medications that affect exercise Asthma inhaler      Mobility and Daily Activities   I find it easy to walk up or down two or more flights of stairs. 1    I have no trouble taking out the trash. 3    I do housework such as vacuuming and dusting on my own without difficulty. 3    I can easily lift a gallon of milk (8lbs). 4    I can easily walk a mile. 1    I have no trouble reaching into high cupboards or reaching down to pick up something from the floor. 3    I do not have trouble doing out-door work such as Armed forces logistics/support/administrative officer, raking leaves, or gardening. 1      Mobility and Daily Activities   I feel younger than my age. 3    I feel independent. 4    I feel energetic. 3    I live an active life.  3    I feel strong. 3    I feel healthy. 2    I feel active as other people my age. 2      How fit and strong are you.   Fit and Strong Total Score 36            Past Medical History:   Diagnosis Date   Anemia    Aortic atherosclerosis ([place])    Asthma    Atrial fibrillation and flutter ([place])    a.) CHA2DS2VASc = 6 (sex, HTN, TIA x2, vascular disease history, T2DM);  b.) DCCV ~ 2009; c.) DCCV 08/29/2018 (200J x1 --> SR with 1st degree AVB); d.) rate/rhythm maintained on oral diltiazem + bisoprolol; chronically anticoagulated with apixaban   Bipolar disorder ([place])    Carpal tunnel syndrome of right wrist    CHB (complete heart block) ([place])    a.) s/p St. Jude dual chamber PPM placement   CHF (congestive heart failure) (H[facility])    Complication of anesthesia    a.) "allergic reaction" to perioperative analgesics + anesthetic medications in [place]; unsure of combination; not able to recount reactions/events --> states "I ended up in the ICU for 5 days"   Constipation    a.) on linaclotide   DDD (degenerative disc disease), thoracic    GERD (gastroesophageal reflux disease)    H/O  congenital atrial septal defect (ASD) repair    Hallux valgus of right foot    Hammertoe of second toe of right foot    History of 2019 novel coronavirus disease (COVID-19)    a.) 10/2020; b.) 05/2021   HLD (hyperlipidemia)    HTN (hypertension)    Hypogammaglobulinemia ([place])    a.) on Curitru   Hypothyroid    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 Jude dual chamber device; placed for symptomatic bradycardia secondary to intermittent CHB   PTSD (post-traumatic stress disorder)    Pulmonary nodules    Recurrent sinusitis    Short-term memory loss    Sleep difficulties    a.)  takes melatonin   Symptomatic bradycardia    a.) s/p St. Jude dual chamber PPM placement   Syncope    TIA (transient ischemic attack)    x3-last one in 2015   Tremors of nervous system    Type 2 diabetes mellitus ([place])    Past Surgical History:  Procedure Laterality Date   ASD REPAIR  1968   BIOPSY  01/26/2021   Procedure: BIOPSY;  Surgeon: [name], DO;  [facility];  Service: Endoscopy;;  gastric   CARDIOVERSION  2009   CARDIOVERSION N/A 08/29/2018   Procedure: CARDIOVERSION;  Surgeon: Arnoldo Lenis, MD;  [facility];  Service: Endoscopy;  Laterality: N/A;   CARPAL TUNNEL RELEASE Bilateral    [place]   COLONOSCOPY WITH PROPOFOL N/A 01/26/2021   Procedure: COLONOSCOPY WITH PROPOFOL;  Surgeon: [name], DO;  [facility];  Service: Endoscopy;  Laterality: N/A;  am appt, diabetic   ESOPHAGOGASTRODUODENOSCOPY (EGD) WITH PROPOFOL N/A 01/26/2021   Procedure: ESOPHAGOGASTRODUODENOSCOPY (EGD) WITH PROPOFOL;  Surgeon: [name], DO;  [facility];  Service: Endoscopy;  Laterality: N/A;   HALLUX VALGUS [place] Right 07/29/2022   Procedure: HALLUX VALGUS;  Surgeon: [name], DPM;  Location: [facility];  Service: Podiatry;  Laterality: Right;   HAMMER TOE SURGERY Right 07/29/2022   Procedure: HAMMER TOE CORRECTION 2ND;  Surgeon: [name], DPM;  Location: [facility];  Service: Podiatry;  Laterality: Right;   LEFT HEART CATH AND CORONARY ANGIOGRAPHY N/A 08/30/2019   Procedure: LEFT HEART CATH AND CORONARY ANGIOGRAPHY;  Surgeon: [name], MD;  Location: [place] [facility];  Service: Cardiovascular;  Laterality: N/A;   NASAL SINUS SURGERY  2017   PACEMAKER IMPLANT N/A 03/03/2022   Procedure: PACEMAKER IMPLANT;  Surgeon: [name], MD;  Location: [place] [facility];  Service: Cardiovascular;  Laterality: N/A;   POLYPECTOMY  01/26/2021   Procedure: POLYPECTOMY;   Surgeon: [name], DO;  [facility];  Service: Endoscopy;;  colon   Social History   Tobacco Use  Smoking Status Former   Packs/day: 1.00   Years: 28.00   Total pack years: 28.00   Types: Cigarettes   Start date: 46  Quit date: 2008   Years since quitting: 15.9  Smokeless Tobacco Never    [place]ris Cross 09/14/2022, 10:47 AM  Began PREP Class today 09/13/2022. Will meet every Tuesday/Thursday [id number]. No barriers to participation noted.

## 2022-09-14 NOTE — Telephone Encounter (Signed)
ADS form was faxed to [place]. This was filled out and sent back

## 2022-09-14 NOTE — Progress Notes (Signed)
[facility] PREP Weekly Session  Patient Details  Name: Laura [name] MRN: [id number] Date of Birth: 1961/03/29 Age: 61 y.o. PCP: [name], MD  Vitals:   09/13/22 1300  Weight: 217 lb (98.4 kg)     [facility] Weekly seesion - 09/13/22 1300       [facility] "PREP" Location   [facility] "PREP" Location [facility]      Weekly Session   Topic Discussed Goal setting and welcome to the program   Introductions, workbooks reviewed, tour of facility.   Classes attended to date [place], [place] 09/14/2022, 10:53 AM

## 2022-09-16 ENCOUNTER — Other Ambulatory Visit: Payer: Self-pay | Admitting: Family Medicine

## 2022-09-20 ENCOUNTER — Other Ambulatory Visit: Payer: Self-pay | Admitting: Internal Medicine

## 2022-09-21 ENCOUNTER — Encounter: Payer: Self-pay | Admitting: *Deleted

## 2022-09-21 NOTE — Progress Notes (Signed)
[facility] PREP Weekly Session  Patient Details  Name: Laura [name] MRN: [id number] Date of Birth: 03-15-1961 Age: 61 y.o. PCP: [name], MD  Vitals:   09/20/22 1300  Weight: 217 lb (98.4 kg)     [facility] Weekly seesion - 09/20/22 1300       [facility] "PREP" Location   [facility] "PREP" Location [facility]      Weekly Session   Topic Discussed Other ways to be active;Importance of resistance training   Reviewed national standards for cardiovascular exercise and resistance training   Minutes exercised this week 25 minutes    Classes attended to date [place], [place] 09/21/2022, 10:34 AM

## 2022-09-22 ENCOUNTER — Telehealth: Payer: Self-pay | Admitting: Pulmonary Disease

## 2022-09-22 NOTE — Telephone Encounter (Signed)
Npsg did not show any significant OSA.  Showed limb movements. Please schedule follow-up office visit to discuss

## 2022-09-23 NOTE — Telephone Encounter (Signed)
Called and spoke w/ pt she is schedule for f/u OV 1/24 @ 9am

## 2022-09-24 ENCOUNTER — Other Ambulatory Visit: Payer: Self-pay | Admitting: "Endocrinology

## 2022-09-26 ENCOUNTER — Ambulatory Visit (INDEPENDENT_AMBULATORY_CARE_PROVIDER_SITE_OTHER): Payer: Medicare Other | Admitting: Clinical

## 2022-09-26 DIAGNOSIS — F4310 Post-traumatic stress disorder, unspecified: Secondary | ICD-10-CM | POA: Diagnosis not present
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
## 2022-09-26 NOTE — Progress Notes (Signed)
Virtual Visit via Video Note   I connected with Laura [name] on 09/26/2022 at  9:00 AM EST by a video enabled telemedicine application and verified that I am speaking with the correct person using two identifiers.   Location: Patient: Home Provider: Office   I discussed the limitations of evaluation and management by telemedicine and the availability of in person appointments. The patient expressed understanding and agreed to proceed.   THERAPIST PROGRESS NOTE   Session Time: 9:00 AM-:9:55 AM   Participation Level: Active   Behavioral Response: CasualAlertDepressed   Type of Therapy: Individual Therapy   Treatment Goals addressed: Coping   Interventions: CBT, DBT, Solution Focused, Strength-based and Supportive   Summary: Laura [name] is a 62 y.o. female who presents with PTSD. The OPT therapist worked with the patient for her ongoing OPT treatment session. The OPT therapist utilized Motivational Interviewing to assist in creating therapeutic repore.The patient in the session was engaged and work in collaboration giving feedback about her triggers and symptoms over the past few weeks.The patient spoke about ongoing recovery from her foot surgery including recently getting out of the boot and going to a support shoe and was told her healing was going well from her podiatrist.  The patient will transition from supportive shoe back to regular tennis shoe this Wednesday. The patient has been able to be more active recently with her recovery returning to driving, and attending classes at the [facility]. The OPT therapist utilized Cognitive Behavioral Therapy through cognitive restructuring as well as worked with the patient on coping strategies to assist in management of mental health symptoms. The patient verbalized, "  I have been able to get out more since I have been healing so I can drive again and I have been getting out and going to the [facility]".  The patient spoke about the impact of her  surgery and recovery on her mental health. The OPT therapist worked with the patient on her adjustment and coping strategies during her ongoing recovery process from recent foot surgery. The patient spoke about managing her down time during her recovery over the past few weeks. The patient spoke about plans for the upcoming Christmas and New Years holidays. The OPT therapist overviewed with the patient her basic health areas including sleep, eating, exercise, and hygiene. The patient noted that she is currently struggling with consistency with her eating habits.The OPT therapist placed emphasis on the patient keeping all upcoming health appointments and adhering to ongoing health treatment recommendations. The OPT therapist reviewed all appointments listed coming up in the patients MyChart including upcoming appointment with Dr. Modesta Messing this Wednesday.    Suicidal/Homicidal: Nowithout intent/plan   Therapist Response:The OPT therapist worked with the patient for the patients scheduled session. The patient was engaged in her session and gave feedback in relation to triggers, symptoms, and behavior responses over the past few weeks. The OPT therapist worked with the patient utilizing an in session Cognitive Behavioral Therapy exercise.  The patient spoke about impact of ongoing recovery from recent foot surgery and while improved still having some restrictions around mobility  The patient spoke about her expectations with her ongoing recovery post foot surgery.The patient spoke about the next step in her recovery process and getting further clearance allowing her to work her lower half when she goes to [employer]. The patient notes currently she is now able to drive again and working to get back to her Tuesdays and Thursdays at the [facility]. The patient has been utilizing  painting and journaling as a coping strategy.The patient spoke about her interactions with her daughters and this as of recent has been good. The  patient noted she recently decided to change her phone number due to having someone she was talking to turn out to be a scammer who kept trying to reach her even after blocking him.This situation led to the patient deciding to halt her attempts at online dating. The patient spoke about her plans in the lead up and for Christmas Holiday including going shopping this week with her sister and spending time with family at her home and cooking for family members. The OPT therapist will continue treatment work with the patient in her next scheduled session.   Plan: Return again in 2/3 weeks.   Diagnosis:      Axis I:Post Traumatic Stress Disorder                            Axis II: No diagnosis     Collaboration of Care: Overview with the patient of involvement in the Med Management program with Dr. Modesta Messing.   Patient/Guardian was advised Release of Information must be obtained prior to any record release in order to collaborate their care with an outside provider. Patient/Guardian was advised if they have not already done so to contact the registration department to sign all necessary forms in order for [place] to release information regarding their care.    Consent: Patient/Guardian gives verbal consent for treatment and assignment of benefits for services provided during this visit. Patient/Guardian expressed understanding and agreed to proceed.      I discussed the assessment and treatment plan with the patient. The patient was provided an opportunity to ask questions and all were answered. The patient agreed with the plan and demonstrated an understanding of the instructions.   The patient was advised to call back or seek an in-person evaluation if the symptoms worsen or if the condition fails to improve as anticipated.   I provided 55 minutes of non-face-to-face time during this encounter.   [name], LCSW   09/26/2022

## 2022-09-27 ENCOUNTER — Encounter: Payer: Self-pay | Admitting: *Deleted

## 2022-09-27 NOTE — Progress Notes (Signed)
[facility] PREP Weekly Session  Patient Details  Name: Laura [name] MRN: [id number] Date of Birth: [birth year] Age: 61 y.o. PCP: [name], MD  Vitals:   09/27/22 1654  Weight: 220 lb (99.8 kg)     [facility] Weekly seesion - 09/27/22 1300       [facility] "PREP" Location   [facility] "PREP" Location [facility]      Weekly Session   Topic Discussed Healthy eating tips   Recommended daily intake of salt and sugar. Discussed importance of healthy carbohydrates, proteins and fats.   Minutes exercised this week 50 minutes    Classes attended to date [place], [place] 09/27/2022, 4:56 PM

## 2022-09-28 ENCOUNTER — Telehealth (INDEPENDENT_AMBULATORY_CARE_PROVIDER_SITE_OTHER): Payer: Medicare Other | Admitting: Psychiatry

## 2022-09-28 ENCOUNTER — Encounter: Payer: Self-pay | Admitting: Psychiatry

## 2022-09-28 DIAGNOSIS — F0630 Mood disorder due to known physiological condition, unspecified: Secondary | ICD-10-CM

## 2022-09-28 DIAGNOSIS — D839 Common variable immunodeficiency, unspecified: Secondary | ICD-10-CM | POA: Diagnosis not present

## 2022-09-28 MED ORDER — DIVALPROEX SODIUM ER 500 MG PO TB24: ORAL_TABLET | ORAL | 0 refills | Status: DC

## 2022-09-28 NOTE — Patient Instructions (Signed)
Continue Depakote 500 mg in AM, 1000 mg at night  Next appointment: 3/21 at 1'30

## 2022-10-01 ENCOUNTER — Other Ambulatory Visit: Payer: Self-pay | Admitting: Psychiatry

## 2022-10-04 DIAGNOSIS — E1165 Type 2 diabetes mellitus with hyperglycemia: Secondary | ICD-10-CM | POA: Diagnosis not present

## 2022-10-06 ENCOUNTER — Other Ambulatory Visit: Payer: Self-pay | Admitting: Allergy & Immunology

## 2022-10-06 NOTE — Progress Notes (Signed)
Remote pacemaker transmission.   

## 2022-10-11 ENCOUNTER — Ambulatory Visit (INDEPENDENT_AMBULATORY_CARE_PROVIDER_SITE_OTHER): Payer: Medicare Other | Admitting: Neurology

## 2022-10-11 DIAGNOSIS — M5412 Radiculopathy, cervical region: Secondary | ICD-10-CM | POA: Diagnosis not present

## 2022-10-11 NOTE — Procedures (Signed)
[facility]  [address], [place]  [place], [place] [address] Tel: [phone] Fax: [phone] Test Date:  10/11/2022  Patient: Laura [name] DOB: 1961-07-03 Physician: Narda Amber, DO  Sex: Female Height: '5\' 4"'$  Ref Phys: Sharene Butters, PA-C  ID#: [id number]   Technician:    History: This is a 62 year old female referred for evaluation of bilateral hand paresthesias.  NCV & EMG Findings: Extensive electrodiagnostic testing of the right upper extremity and additional studies of the left shows:  Bilateral median, ulnar and mixed palmar sensory responses are within normal limits. Bilateral median and ulnar motor responses are within normal limits.  Of note, there is evidence of bilateral Martin-Gruber anastomosis, a normal anatomic variant. Chronic motor axon loss changes are seen affecting bilateral biceps and deltoid muscles, without accompanying active denervation.  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 Compared to prior study on 01/13/2021, there has been resolution of previously seen right carpal tunnel syndrome and left ulnar neuropathy at the elbow.   ___________________________ Narda Amber, DO    Nerve Conduction Studies   Stim Site NR Peak (ms) Norm Peak (ms) O-P Amp (V) Norm O-P Amp  Left Median Anti Sensory (2nd Digit)  32 C  Wrist    3.0 <3.8 22.7 >10  Right Median Anti Sensory (2nd Digit)  32 C  Wrist    3.1 <3.8 12.4 >10  Left Ulnar Anti Sensory (5th Digit)  32 C  Wrist    2.6 <3.2 13.6 >5  Right Ulnar Anti Sensory (5th Digit)  32 C  Wrist    2.7 <3.2 17.1 >5     Stim Site NR Onset (ms) Norm Onset (ms) O-P Amp (mV) Norm O-P Amp Site1 Site2 Delta-0 (ms) Dist (cm) Vel (m/s) Norm Vel (m/s)  Left Median Motor (Abd Poll Brev)  32 C  Wrist    3.4 <4.0 7.1 >5 Elbow Wrist 3.9 28.0 54 >50  Elbow    7.3  6.7  Ulnar-wrist crossover Elbow 3.6 0.0    Ulnar-wrist crossover    3.7  2.3         Right  Median Motor (Abd Poll Brev)  32 C  Wrist    3.5 <4.0 7.0 >5 Elbow Wrist 4.9 27.0 55 >50  Elbow    8.4  6.0  Ulnar-wrist crossover Elbow 4.5 0.0    Ulnar-wrist crossover    3.9  4.5         Left Ulnar Motor (Abd Dig Minimi)  32 C  Wrist    2.0 <3.1 8.1 >7 B Elbow Wrist 3.2 20.0 63 >50  B Elbow    5.2  8.1  A Elbow B Elbow 1.8 10.0 56 >50  A Elbow    7.0  8.1         Right Ulnar Motor (Abd Dig Minimi)  32 C  Wrist    2.0 <3.1 9.4 >7 B Elbow Wrist 3.3 21.0 64 >50  B Elbow    5.3  8.5  A Elbow B Elbow 1.6 10.0 62 >50  A Elbow    6.9  8.4            Stim Site NR Peak (ms) Norm Peak (ms) P-T Amp (V) Site1 Site2 Delta-P (ms) Norm Delta (ms)  Left Median/Ulnar Palm Comparison (Wrist - 8cm)  32 C  Median Palm    1.7 <2.2 40.6 Median Palm Ulnar Palm 0.3   Ulnar Palm    1.4 <2.2 16.1  Right Median/Ulnar Palm Comparison (Wrist - 8cm)  32 C  Median Palm    2.0 <2.2 19.6 Median Palm Ulnar Palm 0.3   Ulnar Palm    1.7 <2.2 11.3       Electromyography   Side Muscle Ins.Act Fibs Fasc Recrt Amp Dur Poly Activation Comment  Right 1stDorInt Nml Nml Nml Nml Nml Nml Nml Nml N/A  Right Abd Poll Brev Nml Nml Nml Nml Nml Nml Nml Nml N/A  Right PronatorTeres Nml Nml Nml Nml Nml Nml Nml Nml N/A  Right Biceps Nml Nml Nml *1- *1+ *1+ *1+ Nml N/A  Right Triceps Nml Nml Nml Nml Nml Nml Nml Nml N/A  Right Deltoid Nml Nml Nml *1- *1+ *1+ *1+ Nml N/A  Left 1stDorInt Nml Nml Nml Nml Nml Nml Nml Nml N/A  Left PronatorTeres Nml Nml Nml Nml Nml Nml Nml Nml N/A  Left Biceps Nml Nml Nml *1- *1+ *1+ *1+ Nml N/A  Left Triceps Nml Nml Nml Nml Nml Nml Nml Nml N/A  Left Deltoid Nml Nml Nml *1- *1+ *1+ *1+ Nml N/A     Waveforms:

## 2022-10-12 ENCOUNTER — Encounter: Payer: Self-pay | Admitting: Allergy & Immunology

## 2022-10-12 ENCOUNTER — Ambulatory Visit (INDEPENDENT_AMBULATORY_CARE_PROVIDER_SITE_OTHER): Payer: Medicare Other | Admitting: Allergy & Immunology

## 2022-10-12 VITALS — BP 162/88 | HR 63 | Temp 98.5°F | Resp 20

## 2022-10-12 DIAGNOSIS — J310 Chronic rhinitis: Secondary | ICD-10-CM

## 2022-10-12 DIAGNOSIS — D839 Common variable immunodeficiency, unspecified: Secondary | ICD-10-CM

## 2022-10-12 DIAGNOSIS — K219 Gastro-esophageal reflux disease without esophagitis: Secondary | ICD-10-CM

## 2022-10-12 DIAGNOSIS — J4550 Severe persistent asthma, uncomplicated: Secondary | ICD-10-CM | POA: Diagnosis not present

## 2022-10-12 NOTE — Progress Notes (Signed)
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 Merlene Laughter, but wants to consolidate care at [facility]   Tremors - thought to be medication induced (followed by [facility])   Headaches (followed by [facility])   Recent fall - with clear head CT   Non-allergic rhinitis   Paroxysmal atrial fibrillation - followed by Cardiology   Enlarged lymph nodes on chest CT - with no mention of them on a recent chest CT in June 2023 (but they are mentioned in an April chest CT)    Plan/Recommendations:   1. Hypogammaglobulinemia - Continue with Cuvitru.  - We will continue with 27 g every 2 weeks. - We will get repeat labs next time.  - I think we are in a good spot.   2. Chronic rhinitis   - Continue with nasal saline rinses as needed.  - You seem to have a good sense of your symptoms. - We could add a nasal   3. Severe persistent asthma, uncomplicated - Lung testing looks decent today (essentially stable from last time)  - We are going to refill your current medications.  - Stop the Breo temporarily and start Trelegy one puff once daily instead (sample provided).  - Let us know if you like this medication more and we can send it in. - Maybe this will help with the chest heaviness.  - Daily medication: Trelegy 179mg one puff once daily + Fasenra every 8 weeks - Prior to physical activity: Xopenex 2 puffs 10-15 minutes before physical activity. - Rescue medications:  Xopenex nebulizer every 4-6 hours as needed, Xopenex 4 puffs every 4-6 hours as needed, or DuoNeb nebulizer one vial every 4-6 hours as needed - Asthma control goals:  * Full participation in all desired activities  (may need albuterol before activity) * Albuterol use two time or less a week on average (not counting use with activity) * Cough interfering with sleep two time or less a month * Oral steroids no more than once a year * No hospitalizations.  4. Return in about 3 months (around 01/11/2023).   Subjective:   Laura Enterlineis a 62y.o. adult presenting today for follow up of  Chief Complaint  Patient presents with   Asthma    Not been too good. Has been having to use her albuterol a lot. Has been having some cough.    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 07/29/2022   Joint contracture of foot, right 07/29/2022   Neuropathy 07/29/2022   Chest pain 02/08/2022   Syncope 02/07/2022   Class 2 severe obesity due to excess calories with serious comorbidity and body mass index (BMI) of 35.0 to 35.9 in adult ([facility] 01/14/2022   Acute cough 12/31/2021   Common variable immunodeficiency ([place] 12/31/2021   Vitamin B12 deficiency 05/21/2021   Vitamin D deficiency 05/21/2021   Abdominal pain 04/27/2021   Urinary incontinence 04/27/2021   Dysphagia 04/27/2021   Constipation 04/27/2021   Mixed  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 (Laura [name]) 04/10/2021   Gastroesophageal reflux disease 04/10/2021   Adnexal cyst 01/19/2021   Chronic RLQ pain 01/19/2021   Mixed stress and urge urinary incontinence 01/19/2021   Other fatigue 12/10/2020   Abdominal pain, chronic, right lower quadrant 12/08/2020   Abnormal weight loss 12/08/2020   Poor appetite 12/08/2020   Severe persistent asthma with acute exacerbation 10/01/2019   Abnormal EKG    Chronic diastolic (congestive) heart failure ([place]) 08/29/2019   Shortness of breath 08/29/2019   Musculoskeletal chest pain 08/28/2019    Headache 08/10/2018   H/O atrial flutter 08/10/2018   OSA on CPAP 08/10/2018   Anxiety 08/10/2018   Uncontrolled type 2 diabetes mellitus with hyperglycemia ([place]) 03/13/2018   Hypothyroidism 03/13/2018   Essential hypertension, benign 03/13/2018   Mixed hyperlipidemia 03/13/2018   Hypogammaglobulinemia ([place]) 02/06/2018   Non-allergic rhinitis 02/06/2018   Severe persistent asthma, uncomplicated 51/70/0174   Pulmonary nodules 02/06/2018   Bronchiectasis without complication ([place]) 94/49/6759   Type 2 diabetes mellitus with hyperglycemia, with long-term current use of insulin ([place]) 02/06/2018    History obtained from: chart review and patient.  Laura [name] is a 62 y.o. adult presenting for a follow up visit.  She was last seen here in October 2023.  At that time, we continued with her Qvar through 27 g every 2 weeks.  Her IgG level is excellent.  For her rhinitis, we will continue with nasal saline rinses.  Her lung testing looked decent but was a bit lower.  We are fine with her stopping her Flovent, but we did continue with Breo 100 mcg 1 puff daily and Fasenra every 8 weeks.  She has Xopenex to use as needed for flares.  Since last visit, she has done well for the most part.   Asthma/Respiratory Symptom History: She has been using her rescue inhaler twice daily for a period of time, around two weeks total. She has not been using her nebulizer. She has CHF and she feels that it is a "all connected".  She does not want to to add any medications at all. She is rather a minimalist at heart. She has not been on   She continues to see Dr. Elsworth Soho for management of her sleep disorder. She had a sleep study recently, performed in the hospital. Now she was told that she does NOT have sleep apnea. They think that this is due to her losing so much weight. She did not stop her CPAP because she is not convinced that she no longer has OSA. She is afraid that if she stops the machine. She did have restless leg  syndrome which impacts her sleep. She is not convinced that she wants to stop the CPAP. She is seeing Dr. Elsworth Soho on January 24th to discuss the results of the study.   Allergic Rhinitis Symptom History: She has had some postnasal drip and generalized malaise, but she denies being sick per say. Since we raised the dosage, she has been very good. She just reports that she has been having a lot of sinus drainage. She uses the NeilMed bottle every day or so, especially when she showers.  She has been having a lot of clear drainage from her left eye.   Infusions are going well.  She is on the increased dose of 27 g every 2 weeks.  She was previously on 25 g every 2 weeks.  However, since she was developing frequent infections, we increased the dose and  she seems to have responded very well. She denies any reactions to her Cuvitru. She does think that she feels some more energy. It does take a tad longer, maybe ten minutes longer. These are going fine. She does them every two weeks. It takes around around 2-3 hours total for her infusions.  Her last course of antibiotics was what we gave her when she was in [place] which was over a year ago or so. She has done much better with the higher dosing of the Cuvitru.   Otherwise, there have been no changes to her past medical history, surgical history, family history, or social history.    Review of Systems  Constitutional: Negative.  Negative for chills, fever, malaise/fatigue and weight loss.  HENT:  Positive for congestion. Negative for ear discharge, ear pain and sinus pain.   Eyes:  Negative for pain, discharge and redness.  Respiratory:  Negative for cough, sputum production, shortness of breath, wheezing and stridor.        Positive for chest heaviness.   Cardiovascular: Negative.  Negative for chest pain and palpitations.  Gastrointestinal:  Negative for abdominal pain, constipation, diarrhea, heartburn, nausea and vomiting.  Skin: Negative.  Negative  for itching and rash.  Neurological:  Negative for dizziness and headaches.  Endo/Heme/Allergies:  Negative for environmental allergies. Does not bruise/bleed easily.       Objective:   Blood pressure (!) 162/88, pulse 63, temperature 98.5 F (36.9 C), resp. rate 20, SpO2 96 %. There is no height or weight on file to calculate BMI.    Physical Exam Vitals reviewed.  Constitutional:      Appearance: She is well-developed and overweight.     Comments: Very talkative.   HENT:     Head: Normocephalic and atraumatic.     Right Ear: Tympanic membrane, ear canal and external ear normal.     Left Ear: Tympanic membrane, ear canal and external ear normal.     Nose: No nasal deformity, septal deviation, mucosal edema or rhinorrhea.     Right Turbinates: Enlarged, swollen and pale.     Left Turbinates: Enlarged, swollen and pale.     Right Sinus: No maxillary sinus tenderness or frontal sinus tenderness.     Left Sinus: No maxillary sinus tenderness or frontal sinus tenderness.     Comments: No nasal polyps noted.     Mouth/Throat:     Lips: Pink.     Mouth: Mucous membranes are moist. Mucous membranes are not pale and not dry.     Pharynx: Uvula midline.  Eyes:     General: Lids are normal. Allergic shiner present.        Right eye: No discharge.        Left eye: No discharge.     Conjunctiva/sclera: Conjunctivae normal.     Right eye: Right conjunctiva is not injected. No chemosis.    Left eye: Left conjunctiva is not injected. No chemosis.    Pupils: Pupils are equal, round, and reactive to light.  Cardiovascular:     Rate and Rhythm: Normal rate and regular rhythm.     Heart sounds: Normal heart sounds.  Pulmonary:     Effort: Pulmonary effort is normal. No tachypnea, accessory muscle usage or respiratory distress.     Breath sounds: Normal breath sounds. No wheezing, rhonchi or rales.     Comments: Moving air well in all lung fields. No increased work of breathing noted.   Chest:     Chest  wall: No tenderness.  Lymphadenopathy:     Cervical: No cervical adenopathy.  Skin:    Coloration: Skin is not pale.     Findings: No abrasion, erythema, petechiae or rash. Rash is not papular, urticarial or vesicular.  Neurological:     Mental Status: She is alert.     Comments: Hand tremors are much better than before.   Psychiatric:        Behavior: Behavior is cooperative.      Diagnostic studies:    Spirometry: results abnormal (FEV1: 1.60/67%, FVC: 2.22/73%, FEV1/FVC: 72%).    Spirometry consistent with possible restrictive disease. Overall, this is stable.    Allergy Studies: none        Salvatore Marvel, MD  [facility]

## 2022-10-12 NOTE — Patient Instructions (Addendum)
1. Hypogammaglobulinemia - Continue with Cuvitru.  - We will continue with 27 g every 2 weeks. - We will get repeat labs next time.  - I think we are in a good spot.   2. Chronic rhinitis   - Continue with nasal saline rinses as needed.  - You seem to have a good sense of your symptoms. - We could add a nasal   3. Severe persistent asthma, uncomplicated - Lung testing looks decent today (essentially stable from last time)  - We are going to refill your current medications.  - Stop the Breo temporarily and start Trelegy one puff once daily instead (sample provided).  - Let us know if you like this medication more and we can send it in. - Maybe this will help with the chest heaviness.  - Daily medication: Trelegy 114mg one puff once daily + Fasenra every 8 weeks - Prior to physical activity: Xopenex 2 puffs 10-15 minutes before physical activity. - Rescue medications:  Xopenex nebulizer every 4-6 hours as needed, Xopenex 4 puffs every 4-6 hours as needed, or DuoNeb nebulizer one vial every 4-6 hours as needed - Asthma control goals:  * Full participation in all desired activities (may need albuterol before activity) * Albuterol use two time or less a week on average (not counting use with activity) * Cough interfering with sleep two time or less a month * Oral steroids no more than once a year * No hospitalizations.  4. Return in about 3 months (around 01/11/2023).    Please inform [place] any Emergency Department visits, hospitalizations, or changes in symptoms. Call u[place]efore going to the ED for breathing or allergy symptoms since we might be able to fit you in for a sick visit. Feel free to contact u[place]anytime with any questions, problems, or concerns.  It was a pleasure to see you again today!  Websites that have reliable patient information: 1. American Academy of Asthma, Allergy, and Immunology: www.aaaai.org 2. Food Allergy Research and Education (FARE): foodallergy.org 3.  Mothers of Asthmatics: http://www.asthmacommunitynetwork.org 4. American College of Allergy, Asthma, and Immunology: www.acaai.org   COVID-19 Vaccine Information can be found at: [email or web address] questions related to vaccine distribution or appointments, please email [email or web address] or call [phone]     "Like" [facility] Facebook and Instagram for our latest updates!       Make sure you are registered to vote! If you have moved or changed any of your contact information, you will need to get this updated before voting!  In some cases, you MAY be able to register to vote online: [email or web address]

## 2022-10-13 DIAGNOSIS — G4733 Obstructive sleep apnea (adult) (pediatric): Secondary | ICD-10-CM | POA: Diagnosis not present

## 2022-10-18 ENCOUNTER — Encounter: Payer: Medicare Other | Admitting: Neurology

## 2022-10-20 ENCOUNTER — Ambulatory Visit (INDEPENDENT_AMBULATORY_CARE_PROVIDER_SITE_OTHER): Payer: Medicare Other | Admitting: Podiatry

## 2022-10-20 ENCOUNTER — Ambulatory Visit (INDEPENDENT_AMBULATORY_CARE_PROVIDER_SITE_OTHER): Payer: Medicare Other

## 2022-10-20 DIAGNOSIS — M2011 Hallux valgus (acquired), right foot: Secondary | ICD-10-CM

## 2022-10-24 ENCOUNTER — Telehealth: Payer: Self-pay | Admitting: Physician Assistant

## 2022-10-24 ENCOUNTER — Other Ambulatory Visit: Payer: Self-pay

## 2022-10-24 ENCOUNTER — Ambulatory Visit (INDEPENDENT_AMBULATORY_CARE_PROVIDER_SITE_OTHER): Payer: 59 | Admitting: Clinical

## 2022-10-24 DIAGNOSIS — F4310 Post-traumatic stress disorder, unspecified: Secondary | ICD-10-CM | POA: Diagnosis not present

## 2022-10-24 DIAGNOSIS — R251 Tremor, unspecified: Secondary | ICD-10-CM

## 2022-10-24 NOTE — Progress Notes (Signed)
Virtual Visit via Video Note   I connected with [name] on 10/24/2022 at  9:00 AM EST by a video enabled telemedicine application and verified that I am speaking with the correct person using two identifiers.   Location: Patient: Home Provider: Office   I discussed the limitations of evaluation and management by telemedicine and the availability of in person appointments. The patient expressed understanding and agreed to proceed.   THERAPIST PROGRESS NOTE   Session Time: 9:00 AM-:9:30 AM   Participation Level: Active   Behavioral Response: CasualAlertDepressed   Type of Therapy: Individual Therapy   Treatment Goals addressed: Coping   Interventions: CBT, DBT, Solution Focused, Strength-based and Supportive   Summary: Laura [name] is a 62 y.o. female who presents with PTSD. The OPT therapist worked with the patient for her ongoing OPT treatment session. The OPT therapist utilized Motivational Interviewing to assist in creating therapeutic repore.The patient in the session was engaged and work in collaboration giving feedback about her triggers and symptoms over the past few weeks.The patient spoke about stress due to current car difficulty and check engine light on for 02 sensor. The OPT therapist utilized Cognitive Behavioral Therapy through cognitive restructuring as well as worked with the patient on coping strategies to assist in management of mental health symptoms. The patient verbalized, "  Its just going to break my bank to get it fixed right now so I am just going to wait and save some money and because its a [place] part and connected to the exhaust it cost more". The OPT therapist worked with the patient on her adjustment and coping strategies during her ongoing recovery process from recent foot surgery. The patient spoke about getting full clearance now and she can now exercise again. The patient has been able to do her Tuesday/Thursday class at the [facility]. The OPT  therapist overviewed with the patient her basic health areas including sleep, eating, exercise, and hygiene. The patient noted that she is currently struggling with consistency with her eating habits.The OPT therapist placed emphasis on the patient keeping all upcoming health appointments and adhering to ongoing health treatment recommendations. The OPT therapist reviewed all appointments listed coming up in the patients MyChart including upcoming appointment. The patient overviewed her recent med management appointment with Dr. Modesta Messing.    Suicidal/Homicidal: Nowithout intent/plan   Therapist Response:The OPT therapist worked with the patient for the patients scheduled session. The patient was engaged in her session and gave feedback in relation to triggers, symptoms, and behavior responses over the past few weeks. The OPT therapist worked with the patient utilizing an in session Cognitive Behavioral Therapy exercise.  The patient spoke about impact of ongoing recovery from recent foot surgery and being cleared now to return to working out and she has been going  to [employer]. The patient spoke about her plans for January to be consistent with her working out and getting into better physical health. The patient spoke about picking up her car and waiting to do a repair for her 02 sensor. The OPT therapist worked with the patient overviewing recent med management appointment with Dr. Harrington Challenger and upcoming appointments as listed in her 67.The OPT therapist will continue treatment work with the patient in her next scheduled session.   Plan: Return again in 2/3 weeks.   Diagnosis:      Axis I:Post Traumatic Stress Disorder  Axis II: No diagnosis     Collaboration of Care: Overview with the patient of involvement in the Med Management program with Dr. Modesta Messing.   Patient/Guardian was advised Release of Information must be obtained prior to any record release in order to collaborate their  care with an outside provider. Patient/Guardian was advised if they have not already done so to contact the registration department to sign all necessary forms in order for [place] to release information regarding their care.    Consent: Patient/Guardian gives verbal consent for treatment and assignment of benefits for services provided during this visit. Patient/Guardian expressed understanding and agreed to proceed.      I discussed the assessment and treatment plan with the patient. The patient was provided an opportunity to ask questions and all were answered. The patient agreed with the plan and demonstrated an understanding of the instructions.   The patient was advised to call back or seek an in-person evaluation if the symptoms worsen or if the condition fails to improve as anticipated.   I provided 30 minutes of non-face-to-face time during this encounter.   [name], LCSW   10/24/2022

## 2022-10-24 NOTE — Progress Notes (Signed)
  Subjective:  Patient ID: Laura [name], adult    DOB: 09-17-1961,  MRN: [id number]  Chief Complaint  Patient presents with   Routine Post Op    POV #4 DOS 07/29/2022 BUNIONECTOMY RT FOOT, 2NE HAMMERTOE CORRECTION, LIGAMENT REPAIR/TENDON TRANSFER, BONE CUT IN 2ND METATARSAL      62 y.o. adult returns for post-op check.  Doing better with pain of the pins out  Review of Systems: Negative except as noted in the HPI. Denies N/V/F/Ch.   Objective:  There were no vitals filed for this visit. There is no height or weight on file to calculate BMI. Constitutional Well developed. Well nourished.  Vascular Foot warm and well perfused. Capillary refill normal to all digits.  Calf is soft and supple, no posterior calf or knee pain, negative Homans' sign  Neurologic Normal speech. Oriented to person, place, and time. Epicritic sensation to light touch grossly present bilaterally.  Dermatologic Incisions are well-healed and not hypertrophic  Orthopedic: Some limited range of motion of the second MPJ, mild tenderness   Multiple view plain film radiographs: Good stability of osteotomy with increased bridging across osteotomy site Assessment:   1. Hallux valgus (acquired), right foot    Plan:  Patient was evaluated and treated and all questions answered.  S/p foot surgery right -Overall doing very well and can begin nonimpact exercise as tolerated continue regular shoe gear.  I will see her back in 4 months for follow-up radiographs  Return in about 4 months (around 02/18/2023) for right foot surgery follow up  (new xrays).

## 2022-10-24 NOTE — Telephone Encounter (Signed)
1. Which medications need refilled? (List name and dosage, if known) topamax '50mg'$   2. Which pharmacy/location is medication to be sent to? (include street and city if Management consultant) [facility]

## 2022-10-26 NOTE — Telephone Encounter (Signed)
Approval to send

## 2022-10-27 ENCOUNTER — Ambulatory Visit: Payer: Medicare Other | Admitting: "Endocrinology

## 2022-10-27 ENCOUNTER — Encounter: Payer: 59 | Attending: "Endocrinology | Admitting: Nutrition

## 2022-10-27 ENCOUNTER — Other Ambulatory Visit: Payer: Self-pay | Admitting: Neurology

## 2022-10-27 DIAGNOSIS — E1165 Type 2 diabetes mellitus with hyperglycemia: Secondary | ICD-10-CM | POA: Insufficient documentation

## 2022-10-27 DIAGNOSIS — Z794 Long term (current) use of insulin: Secondary | ICD-10-CM | POA: Diagnosis not present

## 2022-10-27 DIAGNOSIS — R251 Tremor, unspecified: Secondary | ICD-10-CM

## 2022-10-27 NOTE — Progress Notes (Signed)
Medical Nutrition Therapy  Appointment Start time:  1000  Appointment End time: 5  NUTRITION ASSESSMENT  Follow up DM Type 2 Saw [name] today. Increased her Lantus to 50 units due to elevated A1C 7.7%  Her A1C isn't due til next week.  Lost 5 lbs. FBS: 128 mg/dl.  Range 124-128 mg/dl. Trulicity same at 3.0. Metformin 1000 mg BID. Changes made:  Still skipping breakfast.She notes she and her sister are working on trying to fix healthier foods. However, her sister brings in processed foods at times. Drinking more water. Going to start going to [facility] weekly.  Anthropometrics  Wt Readings from Last 3 Encounters:  09/27/22 220 lb (99.8 kg)  09/20/22 217 lb (98.4 kg)  09/13/22 217 lb (98.4 kg)   Ht Readings from Last 3 Encounters:  09/13/22 '5\' 4"'$  (1.626 m)  07/29/22 '5\' 4"'$  (1.626 m)  07/27/22 '5\' 4"'$  (1.626 m)   There is no height or weight on file to calculate BMI. '@BMIFA'$ @ Facility age limit for growth %iles is 20 years. Facility age limit for growth %iles is 20 years.  .  Clinical Medical Hx: CHF and DM,  Medications: Lantus 50 units a day now, Trulicity weekly and Metformin.  Labs:  Lab Results  Component Value Date   HGBA1C 7.7 07/19/2022      Latest Ref Rng & Units 07/27/2022    4:18 PM 07/22/2022    6:19 PM 07/19/2022   12:00 AM  CMP  Glucose 70 - 99 mg/dL  112    BUN 6 - 20 mg/dL  23  15      Creatinine 0.44 - 1.00 mg/dL  0.67  0.8      Sodium 135 - 145 mmol/L  139  141      Potassium 3.5 - 5.1 mmol/L  4.0  4.6      Chloride 98 - 111 mmol/L  112  106      CO2 22 - 32 mmol/L  19  19      Calcium 8.9 - 10.3 mg/dL  9.2  9.7      Total Protein 6.1 - 8.1 g/dL 6.9     Alkaline Phos 25 - 125   93      AST 13 - 35   13      ALT 7 - 35 U/L   14         This result is from an external source.   Notable Signs/Symptoms:    Lifestyle & Dietary  Lives with her sister.   Estimated daily fluid intake:  64 oz   Working on eating meals on time.   Sleep: varies   Stress / self-care: stress eater Current average weekly physical activity: ADL     Estimated Energy Needs Calories: 1200 Carbohydrate: 135g Protein: 90g Fat: 33g   NUTRITION DIAGNOSIS  NB-1.1 Food and nutrition-related knowledge deficit As related to Diabetes Type 2.  As evidenced by A1C 7.7%.   NUTRITION INTERVENTION  Nutrition education (E-1) on the following topics:  Lifestyle Medicine - Whole Food, Plant Predominant Nutrition is highly recommended: Eat Plenty of vegetables, Mushrooms, fruits, Legumes, Whole Grains, Nuts, seeds in lieu of processed meats, processed snacks/pastries red meat, poultry, eggs.    -It is better to avoid simple carbohydrates including: Cakes, Sweet Desserts, Ice Cream, Soda (diet and regular), Sweet Tea, Candies, Chips, Cookies, Store Bought Juices, Alcohol in Excess of  1-2 drinks a day, Lemonade,  Artificial Sweeteners, Doughnuts, Coffee Creamers, "Sugar-free" Products, etc,  etc.  This is not a complete list.....  Exercise: If you are able: 30 -60 minutes a day ,4 days a week, or 150 minutes a week.  The longer the better.  Combine stretch, strength, and aerobic activities.  If you were told in the past that you have high risk for cardiovascular diseases, you may seek evaluation by your heart doctor prior to initiating moderate to intense exercise programs.  Handouts Provided Include  .Nutrition Lifestyle handout  Learning Style & Readiness for Change Teaching method utilized: Visual & Auditory  Demonstrated degree of understanding via: Teach Back  Barriers to learning/adherence to lifestyle change: none  Goals Established by Pt  Keep up the great job! Avoid fast food and processed foods Eat breakfast daily. Increase fruits, vegetables and whole plant based foods Lose 2 lbs per Month Go to [facility] 3 times per week   MONITORING & EVALUATION Dietary intake, weekly physical activity, and blood sugars in 3 month. Increase Lantus to 50 units at  night per [name]..  Next Steps  Get back on track with meal planning and eating meals on time-more plant based foods.

## 2022-10-27 NOTE — Patient Instructions (Signed)
Keep up the great job!! Avoid fast food and processed foods Eat breakfast daily. Increase fruits, vegetables and whole plant based foods Lose 2 lbs per Month Go to [facility] 3 times per week

## 2022-10-28 DIAGNOSIS — D839 Common variable immunodeficiency, unspecified: Secondary | ICD-10-CM | POA: Diagnosis not present

## 2022-11-01 ENCOUNTER — Encounter: Payer: Self-pay | Admitting: "Endocrinology

## 2022-11-01 ENCOUNTER — Encounter: Payer: Self-pay | Admitting: *Deleted

## 2022-11-01 ENCOUNTER — Ambulatory Visit (INDEPENDENT_AMBULATORY_CARE_PROVIDER_SITE_OTHER): Payer: 59 | Admitting: "Endocrinology

## 2022-11-01 VITALS — BP 124/66 | HR 64 | Ht 64.0 in | Wt 218.4 lb

## 2022-11-01 DIAGNOSIS — E782 Mixed hyperlipidemia: Secondary | ICD-10-CM | POA: Diagnosis not present

## 2022-11-01 DIAGNOSIS — E039 Hypothyroidism, unspecified: Secondary | ICD-10-CM | POA: Diagnosis not present

## 2022-11-01 DIAGNOSIS — E1165 Type 2 diabetes mellitus with hyperglycemia: Secondary | ICD-10-CM

## 2022-11-01 DIAGNOSIS — E559 Vitamin D deficiency, unspecified: Secondary | ICD-10-CM

## 2022-11-01 DIAGNOSIS — Z6835 Body mass index (BMI) 35.0-35.9, adult: Secondary | ICD-10-CM

## 2022-11-01 LAB — POCT GLYCOSYLATED HEMOGLOBIN (HGB A1C): HbA1c, POC (controlled diabetic range): 6.5 % (ref 0.0–7.0)

## 2022-11-01 NOTE — Progress Notes (Signed)
[facility] PREP Weekly Session  Patient Details  Name: Corliss Coggeshall MRN: [id number] Date of Birth: 1961/06/21 Age: 62 y.o. PCP: [name], MD  Vitals:   11/01/22 1130  Weight: 218 lb (98.9 kg)     [facility] Weekly seesion - 11/01/22 1300       [facility] "PREP" Location   [facility] "PREP" Location [facility]      Weekly Session   Topic Discussed Other   Portion size matters, tips for portion control, deceptive food labeling, portion size demo.   Minutes exercised this week 240 minutes    Classes attended to date [place], [place] 11/01/2022, 4:23 PM

## 2022-11-01 NOTE — Progress Notes (Signed)
11/01/2022, 12:46 PM   Endocrinology follow-up note   Subjective:    Patient ID: Laura [name], adult    DOB: 1961/06/23.  Laura [name] was seen since 2019 for management of diabetes, also seen during her last visit in relation to her steroid exposure to rule out adrenal insufficiency.  PMD: [name], MD.   Past Medical History:  Diagnosis Date   Anemia    Aortic atherosclerosis ([place])    Asthma    Atrial fibrillation and flutter ([place])    a.) CHA2DS2VASc = 6 (sex, HTN, TIA x2, vascular disease history, T2DM);  b.) DCCV ~ 2009; c.) DCCV 08/29/2018 (200J x1 --> SR with 1st degree AVB); d.) rate/rhythm maintained on oral diltiazem + bisoprolol; chronically anticoagulated with apixaban   Bipolar disorder ([place])    Carpal tunnel syndrome of right wrist    CHB (complete heart block) ([place])    a.) s/p St. Jude dual chamber PPM placement   CHF (congestive heart failure) ([place])    Complication of anesthesia    a.) "allergic reaction" to perioperative analgesics + anesthetic medications in [place]; unsure of combination; not able to recount reactions/events --> states "I ended up in the ICU for 5 days"   Constipation    a.) on linaclotide   DDD (degenerative disc disease), thoracic    GERD (gastroesophageal reflux disease)    H/O congenital atrial septal defect (ASD) repair    Hallux valgus of right foot    Hammertoe of second toe of right foot    History of 2019 novel coronavirus disease (COVID-19)    a.) 10/2020; b.) 05/2021   HLD (hyperlipidemia)    HTN (hypertension)    Hypogammaglobulinemia ([place])    a.) on Curitru   Hypothyroid    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 Jude dual chamber device; placed for symptomatic bradycardia secondary to intermittent CHB   PTSD (post-traumatic stress disorder)    Pulmonary nodules    Recurrent sinusitis    Short-term memory loss    Sleep difficulties    a.) takes melatonin   Symptomatic bradycardia    a.) s/p St. Jude dual chamber PPM placement   Syncope    TIA (transient ischemic attack)    x3-last one in 2015   Tremors of nervous system    Type 2 diabetes mellitus ([place])    Past Surgical History:  Procedure Laterality Date   ASD REPAIR  1968   BIOPSY  01/26/2021   Procedure: BIOPSY;  Surgeon: [name], DO;  [facility];  Service: Endoscopy;;  gastric   CARDIOVERSION  2009   CARDIOVERSION N/A  08/29/2018   Procedure: CARDIOVERSION;  Surgeon: Arnoldo Lenis, MD;  [facility];  Service: Endoscopy;  Laterality: N/A;   CARPAL TUNNEL RELEASE Bilateral    [place]   COLONOSCOPY WITH PROPOFOL N/A 01/26/2021   Procedure: COLONOSCOPY WITH PROPOFOL;  Surgeon: [name], DO;  [facility];  Service: Endoscopy;  Laterality: N/A;  am appt, diabetic   ESOPHAGOGASTRODUODENOSCOPY (EGD) WITH PROPOFOL N/A 01/26/2021   Procedure: ESOPHAGOGASTRODUODENOSCOPY (EGD) WITH PROPOFOL;  Surgeon: [name], DO;  [facility];  Service: Endoscopy;  Laterality: N/A;   HALLUX VALGUS AUSTIN Right 07/29/2022   Procedure: HALLUX VALGUS;  Surgeon: [name], DPM;  Location: [facility];  Service: Podiatry;  Laterality: Right;   HAMMER TOE SURGERY Right 07/29/2022   Procedure: HAMMER TOE CORRECTION 2ND;  Surgeon: [name], DPM;  Location: [facility];  Service: Podiatry;  Laterality: Right;   LEFT HEART CATH AND CORONARY ANGIOGRAPHY N/A  08/30/2019   Procedure: LEFT HEART CATH AND CORONARY ANGIOGRAPHY;  Surgeon: [name], MD;  Location: [facility];  Service: Cardiovascular;  Laterality: N/A;   NASAL SINUS SURGERY  2017   PACEMAKER IMPLANT N/A 03/03/2022   Procedure: PACEMAKER IMPLANT;  Surgeon: [name], MD;  Location: [facility];  Service: Cardiovascular;  Laterality: N/A;   POLYPECTOMY  01/26/2021   Procedure: POLYPECTOMY;  Surgeon: [name], DO;  [facility];  Service: Endoscopy;;  colon   Social History   Socioeconomic History   Marital status: Widowed    Spouse name: Not on file   Number of children: 3   Years of education: 10   Highest education level: Not on file  Occupational History   Not on file  Tobacco Use   Smoking status: Former    Packs/day: 1.00    Years: 28.00    Total pack years: 28.00    Types: Cigarettes    Start date: 28    Quit date: 2008    Years since quitting: 16.0   Smokeless tobacco: Never  Vaping Use   Vaping Use: Never used  Substance and Sexual Activity   Alcohol use: Not Currently   Drug use: Not Currently   Sexual activity: Not Currently    Birth control/protection: Post-menopausal  Other Topics Concern   Not on file  Social History Narrative   Right handed   Drinks caffeine   One story home   Social Determinants of Health   Financial Resource Strain: High Risk (01/06/2022)   Overall Financial Resource Strain (CARDIA)    Difficulty of Paying Living Expenses: Hard  Food Insecurity: No Food Insecurity (01/06/2022)   Hunger Vital Sign    Worried About Running Out of Food in the Last Year: Never true    Ran Out of Food in the Last Year: Never true  Transportation Needs: No Transportation Needs (01/06/2022)   PRAPARE - Hydrologist (Medical): No    Lack of Transportation (Non-Medical): No  Physical Activity: Insufficiently Active (01/06/2022)   Exercise Vital Sign    Days of Exercise  per Week: 4 days    Minutes of Exercise per Session: 30 min  Stress: No Stress Concern Present (01/06/2022)   [place]    Feeling of Stress : Only a little  Social Connections: Moderately Integrated (01/06/2022)   Social Connection and Isolation Panel [NHANES]  Frequency of Communication with Friends and Family: More than three times a week    Frequency of Social Gatherings with Friends and Family: More than three times a week    Attends Religious Services: More than 4 times per year    Active Member of [employer] or Organizations: Yes    Attends Archivist Meetings: More than 4 times per year    Marital Status: Widowed   Outpatient Encounter Medications as of 11/01/2022  Medication Sig   albuterol (PROVENTIL) (2.5 MG/3ML) 0.083% nebulizer solution Take 3 mLs (2.5 mg total) by nebulization every 4 (four) hours as needed for wheezing or shortness of breath.   albuterol (VENTOLIN HFA) 108 (90 Base) MCG/ACT inhaler INHALE 2 PUFFS INTO THE LUNGS EVERY 6 HOURS AS NEEDED FOR WHEEZING OR SHORTNESS OF BREATH   apixaban (ELIQUIS) 5 MG TABS tablet TAKE ONE TABLET BY MOUTH TWICE DAILY _0  & 5PM (Patient taking differently: Take 5 mg by mouth 2 (two) times daily.)   atorvastatin (LIPITOR) 10 MG tablet Take 10 mg by mouth at bedtime.   BD PEN NEEDLE NANO 2ND GEN 32G X 4 MM MISC 1 each by Other route in the morning, at noon, in the evening, and at bedtime.   Benralizumab (FASENRA PEN) 30 MG/ML SOAJ Inject 1 mL (30 mg total) into the skin every 28 (twenty-eight) days. Then every 8 weeks   bisoprolol (ZEBETA) 10 MG tablet Take 1 tablet (10 mg total) by mouth daily. (Patient taking differently: Take 10 mg by mouth every morning.)   blood glucose meter kit and supplies 1 each by Other route 4 (four) times daily. Dispense based on patient and insurance preference. Use up to four times daily as directed. (FOR ICD-10 E10.9, E11.9).   BREO  ELLIPTA 100-25 MCG/ACT AEPB INHALE 1 PUFF INTO LUNGS DAILY (BULK)   Cholecalciferol (VITAMIN D3) 125 MCG (5000 UT) CAPS Take 1 capsule (5,000 Units total) by mouth daily.   CINNAMON PO Take 1 tablet by mouth daily at 6 (six) AM.   Continuous Blood Gluc Sensor (FREESTYLE LIBRE 3 SENSOR) MISC 1 Piece by Does not apply route every 14 (fourteen) days. Place 1 sensor on the skin every 14 days. Use to check glucose continuously   CUVITRU 1 GM/5ML SOLN Inject into the skin.   CUVITRU 10 GM/50ML SOLN Inject 25 mg into the skin every 14 (fourteen) days.   CUVITRU 2 GM/10ML SOLN Inject into the skin.   CUVITRU 4 GM/20ML SOLN Inject into the skin.   diltiazem (CARDIZEM CD) 180 MG 24 hr capsule TAKE ONE CAPSULE BY MOUTH DAILY AT 9AM   diltiazem (CARDIZEM CD) 300 MG 24 hr capsule Take 1 capsule (300 mg total) by mouth daily. (Patient taking differently: Take 300 mg by mouth every morning.)   divalproex (DEPAKOTE ER) 500 MG 24 hr tablet Take 1 tablet (500 mg total) by mouth daily AND 2 tablets (1,000 mg total) at bedtime.   ELDERBERRY PO Take 4 g by mouth daily. Gummies 2g each   EPINEPHrine 0.3 mg/0.3 mL IJ SOAJ injection Inject 0.3 mg into the muscle once as needed for anaphylaxis.   ferrous sulfate 324 MG TBEC Take 324 mg by mouth every evening.   gabapentin (NEURONTIN) 600 MG tablet Take 600 mg by mouth 3 (three) times daily.   hydrOXYzine (ATARAX) 25 MG tablet TAKE ONE TABLET BY MOUTH THREE TIMES DAILY AS NEEDED FOR ANXIETY OR FOR ITCHING (VIAL)   Immune Globulin, Human, (CUVITRU [place]) Inject 27 mg into  the skin every 14 (fourteen) days.   Insulin Pen Needle (PEN NEEDLES 3/16") 31G X 5 MM MISC Use as directed with insulin pen   LANTUS SOLOSTAR 100 UNIT/ML Solostar Pen INJECT 70 UNITS SUBCUTANEOUSLY AT BEDTIME (BULK) (Patient taking differently: 50 Units at bedtime.)   levalbuterol (XOPENEX HFA) 45 MCG/ACT inhaler INHALE TWO PUFFS INTO LUNGS EVERY 6 HOURS AS NEEDED FOR WHEEZING (BULK)   levalbuterol  (XOPENEX) 1.25 MG/3ML nebulizer solution Inhale 1.25 mg into the lungs daily as needed (asthma).   levothyroxine (SYNTHROID, LEVOTHROID) 112 MCG tablet Take 112 mcg by mouth daily before breakfast.   linaclotide (LINZESS) 145 MCG CAPS capsule TAKE ONE CAPSULE BY MOUTH DAILY BEFORE BREAKFAST   losartan (COZAAR) 50 MG tablet Take 50 mg by mouth every morning.   MELATONIN GUMMIES PO Take 10 mg by mouth at bedtime.   metFORMIN (GLUCOPHAGE) 500 MG tablet Take 500 mg by mouth 2 (two) times daily.   montelukast (SINGULAIR) 10 MG tablet Take 10 mg by mouth at bedtime.   Multiple Vitamins-Minerals (MULTIVITAMIN WITH MINERALS) tablet Take 1 tablet by mouth daily. Woman   Nebulizer MISC Nebulizer tubing kit   omeprazole (PRILOSEC) 20 MG capsule Take one capsule 30 minutes before breakfast daily. If you need second capsule you can take another before evening meal.   ondansetron (ZOFRAN-ODT) 8 MG disintegrating tablet Take 8 mg by mouth daily as needed for vomiting or nausea.   ONETOUCH ULTRA test strip 1 each by Other route in the morning, at noon, in the evening, and at bedtime.   polyethylene glycol (MIRALAX / GLYCOLAX) 17 g packet Take 17 g by mouth daily as needed. (Patient taking differently: Take 17 g by mouth daily as needed for moderate constipation or severe constipation.)   Potassium Chloride ER 20 MEQ TBCR Take 20 mEq by mouth daily.   Spacer/Aero-Holding Chambers (AEROCHAMBER PLUS) inhaler 1 each by Other route as needed for other. Use as instructed   topiramate (TOPAMAX) 50 MG tablet TAKE 1 TABLET BY MOUTH DAILY   torsemide (DEMADEX) 20 MG tablet Take 1 tablet daily for fluid gain of 3 lb overnight or 5 lbs in a week. (Patient taking differently: Take 20 mg by mouth as needed. Take 1 tablet daily for fluid gain of 3 lb overnight or 5 lbs in a week.)   triamcinolone cream (KENALOG) 0.1 % Apply 1 application. topically daily as needed (foot).   TRULICITY 3 EV/0.3JK SOPN INJECT 3MG AS DIRECTED ONCE A  WEEK (BULk0   TURMERIC PO Take 1 tablet by mouth daily at 6 (six) AM.   vitamin C (ASCORBIC ACID) 500 MG tablet Take 500 mg by mouth daily. Power C immune support   [DISCONTINUED] vitamin B-12 (CYANOCOBALAMIN) 1000 MCG tablet Take 1,000 mcg by mouth daily.   Facility-Administered Encounter Medications as of 11/01/2022  Medication   Benralizumab SOSY 30 mg    ALLERGIES: Allergies  Allergen Reactions   Ativan [Lorazepam] Hives   Phenergan [Promethazine Hcl] Hives    VACCINATION STATUS: Immunization History  Administered Date(s) Administered   Influenza Split 08/24/2018   Influenza Whole 07/15/2019   Influenza,inj,Quad PF,6+ Mos 07/10/2017, 10/27/2021   Moderna Sars-Covid-2 Vaccination 12/31/2019, 01/30/2020   Pneumococcal-Unspecified 10/10/2012    Diabetes She presents for her follow-up diabetic visit. She has type 2 diabetes mellitus. Onset time: She was diagnosed at approximate age of 57 years, stated by gestational diabetes with all 3 of her pregnancies in her 74s. Her disease course has been improving (Since her last  visit, she fell and injured herself.  CT scan of the head did not show any intracranial bleeding.[place]). There are no hypoglycemic associated symptoms. Pertinent negatives for hypoglycemia include no confusion, headaches, pallor or seizures. Pertinent negatives for diabetes include no blurred vision, no chest pain, no fatigue, no polydipsia, no polyphagia and no polyuria. There are no hypoglycemic complications. Symptoms are improving. Diabetic complications include a CVA and heart disease. (She has history of open heart surgery for ASD in 1965.) Risk factors for coronary artery disease include dyslipidemia, diabetes mellitus, family history, obesity, hypertension, post-menopausal, sedentary lifestyle and tobacco exposure. Her weight is stable. She is following a generally unhealthy diet. When asked about meal planning, she reported none. She has not had a previous visit with a  dietitian. She rarely (She has medical problem with bronchiectasis for which she gets treated with steroids on and off.) participates in exercise. Her home blood glucose trend is decreasing steadily. Her breakfast blood glucose range is generally 140-180 mg/dl. Her lunch blood glucose range is generally 140-180 mg/dl. Her dinner blood glucose range is generally 140-180 mg/dl. Her bedtime blood glucose range is generally 140-180 mg/dl. Her overall blood glucose range is 140-180 mg/dl. (Ms. Nam presents with improved glycemic profile.  Her freestyle libre device was analyzed.  AGP report shows 76% time in range, 24% level 1 hyperglycemia.  No hypoglycemia.  Her point-of-care A1c is 6.5%, progressively improving from 8.3%.    ) An ACE inhibitor/angiotensin II receptor blocker is being taken.  Hyperlipidemia This is a chronic problem. The current episode started more than 1 year ago. Exacerbating diseases include diabetes and obesity. Associated symptoms include myalgias. Pertinent negatives include no chest pain or shortness of breath. Current antihyperlipidemic treatment includes statins. Risk factors for coronary artery disease include dyslipidemia, diabetes mellitus, family history, obesity, hypertension, a sedentary lifestyle and post-menopausal.  Hypertension This is a chronic problem. The current episode started more than 1 year ago. The problem is uncontrolled. Pertinent negatives include no blurred vision, chest pain, headaches, palpitations or shortness of breath. Risk factors for coronary artery disease include dyslipidemia, diabetes mellitus, obesity, sedentary lifestyle and smoking/tobacco exposure. Past treatments include ACE inhibitors. Hypertensive end-organ damage includes CVA.  Other The current episode started more than 1 month ago. The problem occurs constantly. Progression since onset: Patient reports that she did have exposure to steroids related to back pain on 2 separate occasions  between November 2000 21 and 2022.  Last exposure was related to her COVID infection.  She reports that she developed fatigueced that she developed fatigue. Associated symptoms include myalgias. Pertinent negatives include no arthralgias, chest pain, chills, coughing, fatigue, fever, headaches, nausea, rash or vomiting. She has tried nothing for the symptoms. The treatment provided no relief.     Objective:    BP 124/66   Pulse 64   Ht _0  (1.626 m)   Wt 218 lb 6.4 oz (99.1 kg)   BMI 37.49 kg/m   Wt Readings from Last 3 Encounters:  11/01/22 218 lb 6.4 oz (99.1 kg)  10/27/22 215 lb (97.5 kg)  09/27/22 220 lb (99.8 kg)         Latest Ref Rng & Units 07/27/2022    4:18 PM 07/22/2022    6:19 PM 07/19/2022   12:00 AM  CMP  Glucose 70 - 99 mg/dL  112    BUN 6 - 20 mg/dL  23  15      Creatinine 0.44 - 1.00 mg/dL  0.67  0.8  Sodium 135 - 145 mmol/L  139  141      Potassium 3.5 - 5.1 mmol/L  4.0  4.6      Chloride 98 - 111 mmol/L  112  106      CO2 22 - 32 mmol/L  19  19      Calcium 8.9 - 10.3 mg/dL  9.2  9.7      Total Protein 6.1 - 8.1 g/dL 6.9     Alkaline Phos 25 - 125   93      AST 13 - 35   13      ALT 7 - 35 U/L   14         This result is from an external source.    Lipid Panel     Component Value Date/Time   CHOL 149 07/19/2022 0000   TRIG 113 07/19/2022 0000   HDL 45 07/19/2022 0000   CHOLHDL 3.1 05/20/2021 1048   VLDL 22 05/20/2021 1048   LDLCALC 83 07/19/2022 0000   Assessment & Plan:  1. DM type 2 causing vascular disease ([place])  Ms. Failla presents with improved glycemic profile.  Her freestyle libre device was analyzed.  AGP report shows 76% time in range, 24% level 1 hyperglycemia.  No hypoglycemia.  Her point-of-care A1c is 6.5%, progressively improving from 8.3%.    -her diabetes is complicated by CVA, CHF, history of open heart surgery for ASD, obesity/sedentary life, history of chronic smoking with bronchiectasis and Lynzi Meulemans remains at  a high risk for more acute and chronic complications which include CAD, CVA, CKD, retinopathy, and neuropathy. These are all discussed in detail with the patient.  - I have counseled her on diet management and weight loss, by adopting a carbohydrate restricted/protein rich diet.  - she acknowledges that there is a room for improvement in her food and drink choices. - Suggestion is made for her to avoid simple carbohydrates  from her diet including Cakes, Sweet Desserts, Ice Cream, Soda (diet and regular), Sweet Tea, Candies, Chips, Cookies, Store Bought Juices, Alcohol , Artificial Sweeteners,  Coffee Creamer, and "Sugar-free" Products, Lemonade. This will help patient to have more stable blood glucose profile and potentially avoid unintended weight gain.  The following Lifestyle Medicine recommendations according to [facility]) were discussed and and offered to patient and she  agrees to start the journey:  A. Whole Foods, Plant-Based Nutrition comprising of fruits and vegetables, plant-based proteins, whole-grain carbohydrates was discussed in detail with the patient.   A list for source of those nutrients were also provided to the patient.  Patient will use only water or unsweetened tea for hydration. B.  The need to stay away from risky substances including alcohol, smoking; obtaining 7 to 9 hours of restorative sleep, at least 150 minutes of moderate intensity exercise weekly, the importance of healthy social connections,  and stress management techniques were discussed. C.  A full color page of  Calorie density of various food groups per pound showing examples of each food groups was provided to the patient.   - she is advised to stick to a routine mealtimes to eat 3 meals  a day and avoid unnecessary snacks ( to snack only to correct hypoglycemia).   - she is following  with Jearld Fenton, RDN, CDE for individualized diabetes education.  - I have  approached her with the following individualized plan to manage diabetes and patient agrees:   -  She is advised to continue Lantus 50 units nightly, continue Trulicity 3 mg subcutaneously weekly.  Her next prescription for Trulicity will be for 4.5 mg subcutaneously weekly. -She will continue to use her CGM continuously.   She is advised to call clinic for blood glucose readings above 180 mg per DL x3, or less than 70 mg per DL.   -She is advised to continue metformin 500 mg p.o. twice daily.  - Patient specific target  A1c;  LDL, HDL, Triglycerides,  were discussed in detail.  2.  BP/HTN:  -Her blood pressure is controlled to target.  She advised to continue her current blood pressure medications including hydralazine 25 mg 3 times daily.    3) Lipids/HPL: Her most recent lipid panel showed LDL of 83.  She is advised to continue atorvastatin 10 mg p.o. nightly.   Side effects and precautions discussed with her .  4)  Weight/Diet: Her BMI is 3 71.06--YIRSWNI complicating her diabetes care.  She is a candidate for modest weight loss.   The above discussed dietary plan will help achieve weight loss. -She is working with her dietitian.     5 hypothyroidism-long-standing, etiology unclear, she has multiple family members with thyroid dysfunctions. Her previsit thyroid function tests are consistent with appropriate replacement.  She is advised to continue levothyroxine 112 mcg p.o. daily before breakfast.  - We discussed about the correct intake of her thyroid hormone, on empty stomach at fasting, with water, separated by at least 30 minutes from breakfast and other medications,  and separated by more than 4 hours from calcium, iron, multivitamins, acid reflux medications (PPIs). -Patient is made aware of the fact that thyroid hormone replacement is needed for life, dose to be adjusted by periodic monitoring of thyroid function tests.   6) vitamin D deficiency: - She is currently on  vitamin  D supplements including vitamin D3 5000 units daily.     7) Chronic Care/Health Maintenance:  -she  is on  Statin medications and  is encouraged to continue to follow up with Ophthalmology, Dentist,  Podiatrist at least yearly or according to recommendations, and advised to  stay away from smoking. I have recommended yearly flu vaccine and pneumonia vaccination at least every 5 years;  and  sleep for at least 7 hours a day.  - I advised patient to maintain close follow up with [name], MD for primary care needs.  I spent 26 minutes in the care of the patient today including review of labs from [place], Lipids, Thyroid Function, Hematology (current and previous including abstractions from other facilities); face-to-face time discussing  her blood glucose readings/logs, discussing hypoglycemia and hyperglycemia episodes and symptoms, medications doses, her options of short and long term treatment based on the latest standards of care / guidelines;  discussion about incorporating lifestyle medicine;  and documenting the encounter. Risk reduction counseling performed per USPSTF guidelines to reduce  obesity and cardiovascular risk factors.     Please refer to Patient Instructions for Blood Glucose Monitoring and Insulin/Medications Dosing Guide"  in media tab for additional information. Please  also refer to " Patient Self Inventory" in the Media  tab for reviewed elements of pertinent patient history.  Lillias Difrancesco participated in the discussions, expressed understanding, and voiced agreement with the above plans.  All questions were answered to her satisfaction. she is encouraged to contact clinic should she have any questions or concerns prior to her return visit.   Follow up plan: - Return  in about 4 months (around 03/02/2023) for F/U with Pre-visit Labs, Meter/CGM/Logs, A1c here.  Glade Lloyd, MD [facility] [address] [place],  [place] [address] Phone: [phone]  Fax: [phone]    11/01/2022, 12:46 PM  This note was partially dictated with voice recognition software. Similar sounding words can be transcribed inadequately or may not  be corrected upon review.

## 2022-11-01 NOTE — Patient Instructions (Signed)

## 2022-11-02 ENCOUNTER — Other Ambulatory Visit: Payer: Self-pay

## 2022-11-02 ENCOUNTER — Telehealth: Payer: Self-pay | Admitting: Anesthesiology

## 2022-11-02 ENCOUNTER — Ambulatory Visit (INDEPENDENT_AMBULATORY_CARE_PROVIDER_SITE_OTHER): Payer: 59 | Admitting: Pulmonary Disease

## 2022-11-02 ENCOUNTER — Encounter: Payer: Self-pay | Admitting: Pulmonary Disease

## 2022-11-02 VITALS — BP 130/82 | HR 66 | Temp 98.4°F | Ht 64.0 in | Wt 217.6 lb

## 2022-11-02 DIAGNOSIS — G4733 Obstructive sleep apnea (adult) (pediatric): Secondary | ICD-10-CM | POA: Diagnosis not present

## 2022-11-02 DIAGNOSIS — J4550 Severe persistent asthma, uncomplicated: Secondary | ICD-10-CM

## 2022-11-02 DIAGNOSIS — R251 Tremor, unspecified: Secondary | ICD-10-CM

## 2022-11-02 MED ORDER — TOPIRAMATE 50 MG PO TABS: 50.0000 mg | ORAL_TABLET | Freq: Every day | ORAL | 1 refills | Status: DC

## 2022-11-02 NOTE — Assessment & Plan Note (Signed)
She has lost 40 to 50 pounds since her original study and it is possible this is why the current study did not show significant sleep disordered breathing.  I reviewed that study was optimal with supine sleep and REM sleep. We discussed ways to troubleshoot the water coming into the house by decreasing humidity and decreasing the water level in the humidifier She does feel that she still needs a CPAP machine and has benefited from this so she will continue.

## 2022-11-02 NOTE — Telephone Encounter (Signed)
Pt called requesting a refill on her medication.   1. Which medications need refilled? Topamax 50 mg   2. Which pharmacy/location is medication to be sent to? [facility], [phone]  3. Do they need a 30 day or 90 day supply? 90 day supply

## 2022-11-02 NOTE — Patient Instructions (Signed)
  Iron & gabapentin should help with restless legs  Decrease humidity Lower water level Do not use climate controled hose

## 2022-11-02 NOTE — Assessment & Plan Note (Signed)
Okay to continue on Breo since she we have gotten this approved for her for a year. She will continue on Fasenra injections and use albuterol for rescue

## 2022-11-02 NOTE — Progress Notes (Signed)
     Subjective:    Patient ID: Laura [name], adult    DOB: 09-12-1961, 62 y.o.   MRN: [id number]  HPI  62 yo female for FU of asthma, CVID, OSA  Former smoker. 28 pack years. Quit in 2008. Allergy with Dr. Ernst Bowler - on subcu immunoglobulin injections q2weeks and Fasenra in [place] office.    PMH - ASD s/p repair, paroxysmal atrial fibrillation s/p DCCV, status post PPM for tachybradycardia syndrome insulin-dependent diabetes mellitus,  PTSD, bipolar disorder,  hypothyroidism, hyperlipidemia,  hypertension CVID   COVID infection Jan 2022 & again 05/2021   Chief Complaint  Patient presents with   Follow-up    Appt to discuss sleep study results   She continues on Fasenra and immunoglobulin injections as directed by allergy. She was given Trelegy sample to use instead of Breo when she would like mobile on this. [facility]. We reviewed sleep study results. She open a new CPAP machine but reports that the water is coming through the house, she has tried to decrease the humidity, the lid does not close on the humidifier.  DME was not very helpful to troubleshoot the problem She has lost about 40 pounds since her original sleep study   Significant tests/ events reviewed  CT chest 03/2022 clear CTA chest 03/2021 >> RT hilar LN 13 mm, no nodule   CTA 09/18/19 - No pulmonary embolism. Nodule ~37m in right lung base. Minimal right pleural effusion.   04/2021 esophagram >> small hiatal hernia  09/2022 Npsg  (210 lbs ) no  significant OSA.  Showed limb movements   NPSG 11/06/2018  AHI of 11.4/h and RDI of 11.7/h.  HST 03/2010 >> 257 lbs -  AHI 14/h   PFT: 02/2022 ratio 73, FEV1 75%, FVC 81% 11/2021 ratio 75, FEV1 78%, FVC 82% 07/2021 ratio 72, FEV1 67%, FVC 71% 04/2021 Spirometry ratio 70, FEV1 1.60/64%, FVC 2.8/71%   11/2020  Spirometry:  (FEV1: 1.61/64%, FVC: 2.14/66%, FEV1/FVC: 75%).   06/21/19 Spirometry FVC  2.2 (70%) FEV1 1.5 (64%) Ratio 68  Interpretation: Moderate obstructive lung defect is present    Review of Systems neg for any significant sore throat, dysphagia, itching, sneezing, nasal congestion or excess/ purulent secretions, fever, chills, sweats, unintended wt loss, pleuritic or exertional cp, hempoptysis, orthopnea pnd or change in chronic leg swelling. Also denies presyncope, palpitations, heartburn, abdominal pain, nausea, vomiting, diarrhea or change in bowel or urinary habits, dysuria,hematuria, rash, arthralgias, visual complaints, headache, numbness weakness or ataxia.     Objective:   Physical Exam  Gen. Pleasant, obese, in no distress ENT - no lesions, no post nasal drip Neck: No JVD, no thyromegaly, no carotid bruits Lungs: no use of accessory muscles, no dullness to percussion, decreased without rales or rhonchi  Cardiovascular: Rhythm regular, heart sounds  normal, no murmurs or gallops, no peripheral edema Musculoskeletal: No deformities, no cyanosis or clubbing , no tremors       Assessment & Plan:

## 2022-11-02 NOTE — Telephone Encounter (Signed)
Sent again to [facility]	[place] [facility]

## 2022-11-04 ENCOUNTER — Telehealth: Payer: Self-pay | Admitting: Physician Assistant

## 2022-11-04 NOTE — Telephone Encounter (Signed)
Pt called in upset because we sent the topiramate to the incorrect pharmacy. She had been trying to transition it to [facility] and was waiting on it to be delivered, but then found out or office sent it to [facility] in error. She is going to have to pick it up at [facility] now since she has waited so long. She wants to make sure it is not sent to [facility] anymore.

## 2022-11-06 ENCOUNTER — Other Ambulatory Visit: Payer: Self-pay | Admitting: Physician Assistant

## 2022-11-06 DIAGNOSIS — R251 Tremor, unspecified: Secondary | ICD-10-CM

## 2022-11-06 MED ORDER — TOPIRAMATE 50 MG PO TABS: 50.0000 mg | ORAL_TABLET | Freq: Every day | ORAL | 2 refills | Status: DC

## 2022-11-07 ENCOUNTER — Ambulatory Visit (INDEPENDENT_AMBULATORY_CARE_PROVIDER_SITE_OTHER): Payer: 59 | Admitting: Clinical

## 2022-11-07 DIAGNOSIS — F4310 Post-traumatic stress disorder, unspecified: Secondary | ICD-10-CM | POA: Diagnosis not present

## 2022-11-07 NOTE — Progress Notes (Signed)
Virtual Visit via Video Note   I connected with Laura [name] on 11/07/2022 at  1:00 PM EST by a video enabled telemedicine application and verified that I am speaking with the correct person using two identifiers.   Location: Patient: Home Provider: Office   I discussed the limitations of evaluation and management by telemedicine and the availability of in person appointments. The patient expressed understanding and agreed to proceed.   THERAPIST PROGRESS NOTE   Session Time: 1:00 PM-:1:30 PM   Participation Level: Active   Behavioral Response: CasualAlertDepressed   Type of Therapy: Individual Therapy   Treatment Goals addressed: Coping   Interventions: CBT, DBT, Solution Focused, Strength-based and Supportive   Summary: Laura [name] is a 62 y.o. female who presents with PTSD. The OPT therapist worked with the patient for her ongoing OPT treatment session. The OPT therapist utilized Motivational Interviewing to assist in creating therapeutic repore.The patient in the session was engaged and work in collaboration giving feedback about her triggers and symptoms over the past few weeks.The patient spoke about waiting til next week due to the cost to get her 02 sensor fixed on her car.  The patient spoke about her recent interactions with her other family (daughters). The OPT therapist utilized Cognitive Behavioral Therapy through cognitive restructuring as well as worked with the patient on coping strategies to assist in management of mental health symptoms. The patient verbalized, "  Thankfully I have been able to still use the car until I can get it fixed and just using it for the essentials my doctors visits and my [employer] classes". The OPT therapist worked with the patient on consistency in getting back to her health baseline and working to be consistent with her exercise classes. The OPT therapist overviewed with the patient her basic health areas including sleep, eating, exercise, and  hygiene. The patient noted that she is currently struggling with consistency with her eating habits.The OPT therapist placed emphasis on the patient keeping all upcoming health appointments and adhering to ongoing health treatment recommendations. The OPT therapist reviewed all appointments listed coming up in the patients MyChart including upcoming appointment with Dr. Modesta Messing 12/29/2022.    Suicidal/Homicidal: Nowithout intent/plan   Therapist Response:The OPT therapist worked with the patient for the patients scheduled session. The patient was engaged in her session and gave feedback in relation to triggers, symptoms, and behavior responses over the past few weeks. The OPT therapist worked with the patient utilizing an in session Cognitive Behavioral Therapy exercise.  The patient spoke about working to be consistent in going  to [facility] for her classes on Tuesday and Thursday. The patient has been in contact with her family (daughter) over the past week helping give advice and noted she was happy her daughter took her advice.The patient spoke about waiting to do a repair for her 02 sensor. The OPT therapist worked with the patient overviewing recent med management appointment with Dr. Modesta Messing and upcoming appointments as listed in her 37.The OPT therapist will continue treatment work with the patient in her next scheduled session.   Plan: Return again in 2/3 weeks.   Diagnosis:      Axis I:Post Traumatic Stress Disorder                            Axis II: No diagnosis     Collaboration of Care: Overview with the patient of involvement in the Med Management program with Dr. Modesta Messing.  Patient/Guardian was advised Release of Information must be obtained prior to any record release in order to collaborate their care with an outside provider. Patient/Guardian was advised if they have not already done so to contact the registration department to sign all necessary forms in order for Laura [name] to release  information regarding their care.    Consent: Patient/Guardian gives verbal consent for treatment and assignment of benefits for services provided during this visit. Patient/Guardian expressed understanding and agreed to proceed.      I discussed the assessment and treatment plan with the patient. The patient was provided an opportunity to ask questions and all were answered. The patient agreed with the plan and demonstrated an understanding of the instructions.   The patient was advised to call back or seek an in-person evaluation if the symptoms worsen or if the condition fails to improve as anticipated.   I provided 30 minutes of non-face-to-face time during this encounter.   [name], LCSW    11/07/2022

## 2022-11-08 ENCOUNTER — Encounter: Payer: Self-pay | Admitting: *Deleted

## 2022-11-08 NOTE — Progress Notes (Signed)
[facility] PREP Weekly Session  Patient Details  Name: Laura [name] MRN: [id number] Date of Birth: [birth year] Age: 62 y.o. PCP: [name], MD  Vitals:   11/08/22 1130  Weight: 218 lb (98.9 kg)     [facility] Weekly seesion - 11/08/22 1300       [facility] "PREP" Location   [facility] "PREP" Location [place]      Weekly Session   Topic Discussed Expectations and non-scale victories   Halfway goal review and reset, discuss and trouble shoot barriers, celebrate victories and behavioral modifications towards a healthier lifestyle.   Minutes exercised this week 270 minutes    Classes attended to date [place], [place] 11/08/2022, 8:15 PM

## 2022-11-11 ENCOUNTER — Other Ambulatory Visit: Payer: Self-pay | Admitting: Cardiology

## 2022-11-11 DIAGNOSIS — D839 Common variable immunodeficiency, unspecified: Secondary | ICD-10-CM | POA: Diagnosis not present

## 2022-11-11 NOTE — Telephone Encounter (Signed)
Prescription refill request for Eliquis received. Indication:Aflutter Last office visit:10/23 Scr:0.6 10/23 Age: 62 Weight:98.9  kg  Prescription refilled

## 2022-11-13 DIAGNOSIS — G4733 Obstructive sleep apnea (adult) (pediatric): Secondary | ICD-10-CM | POA: Diagnosis not present

## 2022-11-15 ENCOUNTER — Encounter: Payer: Self-pay | Admitting: *Deleted

## 2022-11-15 NOTE — Progress Notes (Signed)
[facility] PREP Weekly Session  Patient Details  Name: Laura [name] MRN: [id number] Date of Birth: 05-Mar-1961 Age: 62 y.o. PCP: [name], MD  Vitals:   11/15/22 1130  Weight: 221 lb (100.2 kg)     [facility] Weekly seesion - 11/15/22 1300       [facility] "PREP" Location   [facility] "PREP" Location [place] [facility]      Weekly Session   Topic Discussed Finding support   Accountabilty partners, tips for maintaining momentum, importance of surrounding yourself with positive, encouraging and supportive people.   Minutes exercised this week 270 minutes    Classes attended to date [place], [place] 11/15/2022, 10:29 PM

## 2022-11-17 ENCOUNTER — Encounter: Payer: Self-pay | Admitting: Nutrition

## 2022-11-21 DIAGNOSIS — J069 Acute upper respiratory infection, unspecified: Secondary | ICD-10-CM | POA: Diagnosis not present

## 2022-11-25 DIAGNOSIS — D839 Common variable immunodeficiency, unspecified: Secondary | ICD-10-CM | POA: Diagnosis not present

## 2022-11-29 ENCOUNTER — Encounter: Payer: Self-pay | Admitting: *Deleted

## 2022-11-29 NOTE — Progress Notes (Signed)
[facility] PREP Weekly Session  Patient Details  Name: Laura [name] MRN: [id number] Date of Birth: [birth year] Age: 62 y.o. PCP: [name], MD  Vitals:   11/29/22 1130  Weight: 216 lb (98 kg)     [facility] Weekly seesion - 11/29/22 1300       [facility] "PREP" Location   [facility] "PREP" Location [place] [place] [facility]      Weekly Session   Topic Discussed Hitting roadblocks   Review. Troubleshooting barriers. Assign Goals and Activities sheet. Staying positive.   Classes attended to date [place], [place] 11/29/2022, 10:12 PM

## 2022-11-30 DIAGNOSIS — E118 Type 2 diabetes mellitus with unspecified complications: Secondary | ICD-10-CM | POA: Diagnosis not present

## 2022-11-30 DIAGNOSIS — E039 Hypothyroidism, unspecified: Secondary | ICD-10-CM | POA: Diagnosis not present

## 2022-11-30 DIAGNOSIS — E782 Mixed hyperlipidemia: Secondary | ICD-10-CM | POA: Diagnosis not present

## 2022-12-01 LAB — LIPID PANEL
Cholesterol: 150 (ref 0–200)
HDL: 50 (ref 35–70)
LDL Cholesterol: 80
LDl/HDL Ratio: 3
Triglycerides: 109 (ref 40–160)

## 2022-12-01 LAB — COMPREHENSIVE METABOLIC PANEL
Albumin: 4.4 (ref 3.5–5.0)
Calcium: 9.5 (ref 8.7–10.7)
Globulin: 3.1

## 2022-12-01 LAB — BASIC METABOLIC PANEL
BUN: 23 — AB (ref 4–21)
CO2: 19 (ref 13–22)
Chloride: 103 (ref 99–108)
Creatinine: 0.7
Glucose: 150
Potassium: 4.5 mEq/L (ref 3.5–5.1)
Sodium: 139 (ref 137–147)

## 2022-12-01 LAB — PROTEIN / CREATININE RATIO, URINE
Albumin, U: 13
Creatinine, Urine: 25.5

## 2022-12-01 LAB — CBC AND DIFFERENTIAL
HCT: 45 (ref 39–52)
Hemoglobin: 15.4 (ref 13.0–17.0)
Platelets: 148 10*3/uL — AB (ref 150–400)
WBC: 5.4

## 2022-12-01 LAB — TSH: TSH: 1.84 (ref 0.41–5.90)

## 2022-12-01 LAB — HEPATIC FUNCTION PANEL
ALT: 10 U/L
AST: 14
Alkaline Phosphatase: 92 (ref 25–125)
Bilirubin, Total: 0.2

## 2022-12-01 LAB — CBC: RBC: 4.65 (ref 3.87–5.11)

## 2022-12-01 LAB — MICROALBUMIN / CREATININE URINE RATIO: Microalb Creat Ratio: 51

## 2022-12-02 ENCOUNTER — Other Ambulatory Visit: Payer: Self-pay | Admitting: Allergy & Immunology

## 2022-12-02 ENCOUNTER — Ambulatory Visit (INDEPENDENT_AMBULATORY_CARE_PROVIDER_SITE_OTHER): Payer: 59

## 2022-12-02 DIAGNOSIS — I495 Sick sinus syndrome: Secondary | ICD-10-CM | POA: Diagnosis not present

## 2022-12-05 LAB — CUP PACEART REMOTE DEVICE CHECK
Battery Remaining Longevity: 86 mo
Battery Remaining Percentage: 95.5 %
Battery Voltage: 3.01 V
Brady Statistic AP VP Percent: 29 %
Brady Statistic AP VS Percent: 1 %
Brady Statistic AS VP Percent: 11 %
Brady Statistic AS VS Percent: 57 %
Brady Statistic RA Percent Paced: 30 %
Brady Statistic RV Percent Paced: 41 %
Date Time Interrogation Session: 20240223203222
Implantable Lead Connection Status: 753985
Implantable Lead Connection Status: 753985
Implantable Lead Implant Date: 20230525
Implantable Lead Implant Date: 20230525
Implantable Lead Location: 753859
Implantable Lead Location: 753860
Implantable Pulse Generator Implant Date: 20230525
Lead Channel Impedance Value: 410 Ohm
Lead Channel Impedance Value: 430 Ohm
Lead Channel Pacing Threshold Amplitude: 0.75 V
Lead Channel Pacing Threshold Pulse Width: 0.5 ms
Lead Channel Sensing Intrinsic Amplitude: 1.6 mV
Lead Channel Sensing Intrinsic Amplitude: 11.3 mV
Lead Channel Setting Pacing Amplitude: 1 V
Lead Channel Setting Pacing Amplitude: 3.5 V
Lead Channel Setting Pacing Pulse Width: 0.5 ms
Lead Channel Setting Sensing Sensitivity: 0.5 mV
Pulse Gen Model: 2272
Pulse Gen Serial Number: 8084453

## 2022-12-06 ENCOUNTER — Telehealth: Payer: Self-pay

## 2022-12-06 ENCOUNTER — Encounter: Payer: Self-pay | Admitting: Internal Medicine

## 2022-12-06 DIAGNOSIS — I110 Hypertensive heart disease with heart failure: Secondary | ICD-10-CM | POA: Diagnosis not present

## 2022-12-06 DIAGNOSIS — D801 Nonfamilial hypogammaglobulinemia: Secondary | ICD-10-CM | POA: Diagnosis not present

## 2022-12-06 DIAGNOSIS — I5032 Chronic diastolic (congestive) heart failure: Secondary | ICD-10-CM | POA: Diagnosis not present

## 2022-12-06 DIAGNOSIS — I442 Atrioventricular block, complete: Secondary | ICD-10-CM | POA: Diagnosis not present

## 2022-12-06 DIAGNOSIS — Z0001 Encounter for general adult medical examination with abnormal findings: Secondary | ICD-10-CM | POA: Diagnosis not present

## 2022-12-06 DIAGNOSIS — G4730 Sleep apnea, unspecified: Secondary | ICD-10-CM | POA: Diagnosis not present

## 2022-12-06 DIAGNOSIS — I10 Essential (primary) hypertension: Secondary | ICD-10-CM | POA: Diagnosis not present

## 2022-12-06 DIAGNOSIS — E1142 Type 2 diabetes mellitus with diabetic polyneuropathy: Secondary | ICD-10-CM | POA: Diagnosis not present

## 2022-12-06 DIAGNOSIS — I480 Paroxysmal atrial fibrillation: Secondary | ICD-10-CM | POA: Diagnosis not present

## 2022-12-06 DIAGNOSIS — E782 Mixed hyperlipidemia: Secondary | ICD-10-CM | POA: Diagnosis not present

## 2022-12-06 DIAGNOSIS — M2041 Other hammer toe(s) (acquired), right foot: Secondary | ICD-10-CM | POA: Diagnosis not present

## 2022-12-06 NOTE — Telephone Encounter (Signed)
Patient called and wants to know if someone can call her about her results and she is worried. Patient would like a call back today for reassurance

## 2022-12-06 NOTE — Telephone Encounter (Signed)
Returned call to Pt.  Advised device is working normally.  She was concerned about the word "NULL" in red on her report.

## 2022-12-06 NOTE — Telephone Encounter (Signed)
Faxed Lab result from Dr. Wende Neighbors, MD.   Placing copies in providers in basket for review.  Forwarding message as update.

## 2022-12-07 ENCOUNTER — Encounter: Payer: Self-pay | Admitting: "Endocrinology

## 2022-12-07 NOTE — Telephone Encounter (Signed)
I received the labs from patient's PCP.  Her triglycerides looked fairly normal.  Her creatinine looked normal.  Her complete blood count was normal.  Thyroid studies were normal.  Results will be scanned into the system.

## 2022-12-08 ENCOUNTER — Encounter: Payer: Self-pay | Admitting: *Deleted

## 2022-12-08 NOTE — Progress Notes (Signed)
[facility] PREP Evaluation  Patient Details  Name: Arnold Goldy MRN: [id number] Date of Birth: June 07, 1961 Age: 62 y.o. PCP: [name], MD  Vitals:   12/08/22 1100  BP: 136/72  Pulse: 90  Resp: (!) 22  SpO2: 98%  Weight: 218 lb 9.6 oz (99.2 kg)  Height: '5\' 4"'$  (1.626 m)     [facility] Eval - 12/08/22 1100       [facility] "PREP" Location   [facility] "PREP" Location [facility] [facility]      Referral    Referring Provider [place]    Reason for referral High Cholesterol;Inactivity;Hypertension;Obesitity/Overweight;Diabetes    Program Start Date 09/13/22    Program End Date 12/08/22      Measurement   Waist Circumference 46.25 inches    Waist Circumference End Program 47.5 inches    Hip Circumference 52 inches    Hip Circumference End Program 51 inches    Body fat 45.3 percent      Mobility and Daily Activities   I find it easy to walk up or down two or more flights of stairs. 3    I have no trouble taking out the trash. 4    I do housework such as vacuuming and dusting on my own without difficulty. 4    I can easily lift a gallon of milk (8lbs). 4    I can easily walk a mile. 4    I have no trouble reaching into high cupboards or reaching down to pick up something from the floor. 3    I do not have trouble doing out-door work such as Armed forces logistics/support/administrative officer, raking leaves, or gardening. 3      Mobility and Daily Activities   I feel younger than my age. 4    I feel independent. 4    I feel energetic. 3    I live an active life.  3    I feel strong. 3    I feel healthy. 3    I feel active as other people my age. 3      How fit and strong are you.   Fit and Strong Total Score 48            Past Medical History:  Diagnosis Date   Anemia    Aortic atherosclerosis ([place])    Asthma    Atrial fibrillation and flutter ([place])    a.) CHA2DS2VASc = 6 (sex, HTN, TIA x2, vascular disease history, T2DM);  b.) DCCV ~ 2009; c.) DCCV 08/29/2018 (200J x1 --> SR with 1st degree AVB); d.) rate/rhythm  maintained on oral diltiazem + bisoprolol; chronically anticoagulated with apixaban   Bipolar disorder ([place])    Carpal tunnel syndrome of right wrist    CHB (complete heart block) ([place])    a.) s/p St. Jude dual chamber PPM placement   CHF (congestive heart failure) (HCC)    Complication of anesthesia    a.) "allergic reaction" to perioperative analgesics + anesthetic medications in [place]; unsure of combination; not able to recount reactions/events --> states "I ended up in the ICU for 5 days"   Constipation    a.) on linaclotide   DDD (degenerative disc disease), thoracic    GERD (gastroesophageal reflux disease)    H/O congenital atrial septal defect (ASD) repair    Hallux valgus of right foot    Hammertoe of second toe of right foot    History of 2019 novel coronavirus disease (COVID-19)    a.) 10/2020; b.) 05/2021  HLD (hyperlipidemia)    HTN (hypertension)    Hypogammaglobulinemia (HCC)    a.) on Curitru   Hypothyroid    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 Jude dual chamber device; placed for symptomatic bradycardia secondary to intermittent CHB   PTSD (post-traumatic stress disorder)    Pulmonary nodules    Recurrent sinusitis    Short-term memory loss    Sleep difficulties    a.) takes melatonin   Symptomatic bradycardia    a.) s/p St. Jude dual chamber PPM placement   Syncope    TIA (transient ischemic attack)    x3-last one in 2015   Tremors of nervous system    Type 2 diabetes mellitus ([place])    Past Surgical History:  Procedure Laterality  Date   ASD REPAIR  1968   BIOPSY  01/26/2021   Procedure: BIOPSY;  Surgeon: [name], DO;  [facility];  Service: Endoscopy;;  gastric   CARDIOVERSION  2009   CARDIOVERSION N/A 08/29/2018   Procedure: CARDIOVERSION;  Surgeon: Arnoldo Lenis, MD;  [facility];  Service: Endoscopy;  Laterality: N/A;   CARPAL TUNNEL RELEASE Bilateral    [place]   COLONOSCOPY WITH PROPOFOL N/A 01/26/2021   Procedure: COLONOSCOPY WITH PROPOFOL;  Surgeon: [name], DO;  [facility];  Service: Endoscopy;  Laterality: N/A;  am appt, diabetic   ESOPHAGOGASTRODUODENOSCOPY (EGD) WITH PROPOFOL N/A 01/26/2021   Procedure: ESOPHAGOGASTRODUODENOSCOPY (EGD) WITH PROPOFOL;  Surgeon: [name], DO;  [facility];  Service: Endoscopy;  Laterality: N/A;   HALLUX VALGUS AUSTIN Right 07/29/2022   Procedure: HALLUX VALGUS;  Surgeon: [name], DPM;  Location: [facility];  Service: Podiatry;  Laterality: Right;   HAMMER TOE SURGERY Right 07/29/2022   Procedure: HAMMER TOE CORRECTION 2ND;  Surgeon: [name], DPM;  Location: [facility] OR[facility];  Service: Podiatry;  Laterality: Right;   LEFT HEART CATH AND CORONARY ANGIOGRAPHY N/A 08/30/2019   Procedure: LEFT HEART CATH AND CORONARY ANGIOGRAPHY;  Surgeon: [name], MD;  Location: [place] [facility];  Service: Cardiovascular;  Laterality: N/A;   NASAL SINUS SURGERY  2017   PACEMAKER IMPLANT N/A 03/03/2022   Procedure: PACEMAKER IMPLANT;  Surgeon: [name], MD;  Location: [place] [facility];  Service: Cardiovascular;  Laterality: N/A;   POLYPECTOMY  01/26/2021   Procedure: POLYPECTOMY;  Surgeon: [name], DO;  [facility];  Service: Endoscopy;;  colon   Social History   Tobacco Use  Smoking Status Former   Packs/day: 1.00   Years: 28.00   Total pack years: 28.00   Types: Cigarettes   Start date: 27   Quit date: 2008   Years since  quitting: 16.1  Smokeless Tobacco Never    [place] 12/08/2022, 4:57 PM  PREP Complete. Attended 7 of 11 education classes and 10 of 11 exercise sessions. Improved Fit and Strong survey  by 12 points. She was however, limited by her foot surgery (boot and limited weight bearing) when she began the course. She performed well on her FIT testing: cardio march and Sit to [place] but I am unable to make a fair comparison from the start of the program when her mobility was limited. Tammy was very engaged in class. She self reports struggling with consistently eating a healthier diet. She has a solid plan for continuing to exercise. Keep working [employer]!

## 2022-12-09 ENCOUNTER — Ambulatory Visit (HOSPITAL_COMMUNITY): Payer: 59 | Admitting: Clinical

## 2022-12-09 DIAGNOSIS — D839 Common variable immunodeficiency, unspecified: Secondary | ICD-10-CM | POA: Diagnosis not present

## 2022-12-12 DIAGNOSIS — G4733 Obstructive sleep apnea (adult) (pediatric): Secondary | ICD-10-CM | POA: Diagnosis not present

## 2022-12-13 ENCOUNTER — Other Ambulatory Visit: Payer: Self-pay | Admitting: Psychiatry

## 2022-12-16 ENCOUNTER — Ambulatory Visit (INDEPENDENT_AMBULATORY_CARE_PROVIDER_SITE_OTHER): Payer: 59 | Admitting: Neurology

## 2022-12-16 DIAGNOSIS — G8929 Other chronic pain: Secondary | ICD-10-CM | POA: Diagnosis not present

## 2022-12-16 DIAGNOSIS — M79671 Pain in right foot: Secondary | ICD-10-CM

## 2022-12-16 DIAGNOSIS — M79672 Pain in left foot: Secondary | ICD-10-CM | POA: Diagnosis not present

## 2022-12-16 DIAGNOSIS — M5412 Radiculopathy, cervical region: Secondary | ICD-10-CM

## 2022-12-16 NOTE — Procedures (Signed)
[facility]  [address] [place]  [place], [place] [address] Tel: [phone] Fax: [phone] Test Date:  12/16/2022  Patient: Kopelyn Dillinger DOB: 1961-06-20 Physician: Narda Amber, DO  Sex: Female Height: '5\' 4"'$  Ref Phys: Sharene Butters, PA-C  ID#: [id number]   Technician:    History: This is a 62 year-old female referred for evaluation of bilateral leg and feet pain.  NCV & EMG Findings: Electrodiagnostic testing of the right lower extremity and additional studies of the left shows: Bilateral sural and superficial peroneal sensory responses are within normal limits. Bilateral peroneal and tibial motor responses are within normal limits. Bilateral tibial H reflex studies are within normal limits. There is no evidence of active or chronic motor axonal changes affecting any of the tested muscles.  Motor unit configuration and recruitment pattern is within normal limits.  Impression: This is a normal study of the lower extremities.  In particular, there is no evidence of a large fiber sensorimotor polyneuropathy or lumbosacral radiculopathy.    ___________________________ Narda Amber, DO    Nerve Conduction Studies   Stim Site NR Peak (ms) Norm Peak (ms) O-P Amp (V) Norm O-P Amp  Left Sup Peroneal Anti Sensory (Ant Lat Mall)  32 C  12 cm    2.1 <4.6 4.1 >3  Right Sup Peroneal Anti Sensory (Ant Lat Mall)  32 C  12 cm    2.4 <4.6 5.6 >3  Left Sural Anti Sensory (Lat Mall)  32 C  Calf    2.7 <4.6 7.6 >3  Right Sural Anti Sensory (Lat Mall)  32 C  Calf    2.5 <4.6 7.4 >3     Stim Site NR Onset (ms) Norm Onset (ms) O-P Amp (mV) Norm O-P Amp Site1 Site2 Delta-0 (ms) Dist (cm) Vel (m/s) Norm Vel (m/s)  Left Peroneal Motor (Ext Dig Brev)  32 C  Ankle    3.6 <6.0 2.7 >2.5 B Fib Ankle 6.3 33.0 52 >40  B Fib    9.9  2.4  Poplt B Fib 1.8 8.0 44 >40  Poplt    11.7  2.2         Right Peroneal Motor (Ext Dig Brev)  32 C  Ankle    3.8 <6.0 2.9 >2.5 B Fib  Ankle 6.0 34.0 57 >40  B Fib    9.8  2.3  Poplt B Fib 1.7 8.0 47 >40  Poplt    11.5  2.2         Left Tibial Motor (Abd Hall Brev)  32 C  Ankle    3.0 <6.0 12.7 >4 Knee Ankle 7.2 38.0 53 >40  Knee    10.2  9.9         Right Tibial Motor (Abd Hall Brev)  32 C  Ankle    2.9 <6.0 8.5 >4 Knee Ankle 6.6 39.0 59 >40  Knee    9.5  6.3          Electromyography   Side Muscle Ins.Act Fibs Fasc Recrt Amp Dur Poly Activation Comment  Right AntTibialis Nml Nml Nml Nml Nml Nml Nml Nml N/A  Right Gastroc Nml Nml Nml Nml Nml Nml Nml Nml N/A  Right RectFemoris Nml Nml Nml Nml Nml Nml Nml Nml N/A  Right BicepsFemS Nml Nml Nml Nml Nml Nml Nml Nml N/A  Left BicepsFemS Nml Nml Nml Nml Nml Nml Nml Nml N/A  Left AntTibialis Nml Nml Nml Nml Nml Nml Nml Nml N/A  Left  Gastroc Nml Nml Nml Nml Nml Nml Nml Nml N/A  Left RectFemoris Nml Nml Nml Nml Nml Nml Nml Nml N/A      Waveforms:

## 2022-12-19 NOTE — Progress Notes (Signed)
Please inform patient that the nerve conduction study is normal, no neuropathy in the legs. All thhe  tests are normal as well. No nerve damage . Pain not related to neurology issue. Follow up with you  primary.

## 2022-12-20 DIAGNOSIS — H9192 Unspecified hearing loss, left ear: Secondary | ICD-10-CM | POA: Diagnosis not present

## 2022-12-20 DIAGNOSIS — R448 Other symptoms and signs involving general sensations and perceptions: Secondary | ICD-10-CM | POA: Diagnosis not present

## 2022-12-20 DIAGNOSIS — L0292 Furuncle, unspecified: Secondary | ICD-10-CM | POA: Diagnosis not present

## 2022-12-20 DIAGNOSIS — L02222 Furuncle of back [any part, except buttock]: Secondary | ICD-10-CM | POA: Diagnosis not present

## 2022-12-20 DIAGNOSIS — J302 Other seasonal allergic rhinitis: Secondary | ICD-10-CM | POA: Diagnosis not present

## 2022-12-22 ENCOUNTER — Telehealth: Payer: Self-pay

## 2022-12-22 DIAGNOSIS — H903 Sensorineural hearing loss, bilateral: Secondary | ICD-10-CM | POA: Diagnosis not present

## 2022-12-22 DIAGNOSIS — H6982 Other specified disorders of Eustachian tube, left ear: Secondary | ICD-10-CM | POA: Diagnosis not present

## 2022-12-22 DIAGNOSIS — H6502 Acute serous otitis media, left ear: Secondary | ICD-10-CM | POA: Diagnosis not present

## 2022-12-22 NOTE — Telephone Encounter (Signed)
Pt having low readings. Printed CGM from [employer]. On Dr Emi Holes desk.

## 2022-12-23 DIAGNOSIS — D839 Common variable immunodeficiency, unspecified: Secondary | ICD-10-CM | POA: Diagnosis not present

## 2022-12-23 NOTE — Telephone Encounter (Signed)
Spoke with pt, advised her to decrease her lantus to 40 units qhs per Dr.Nida's orders. Pt voiced understanding.
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
## 2022-12-26 NOTE — Progress Notes (Signed)
Remote pacemaker transmission.   

## 2022-12-27 ENCOUNTER — Ambulatory Visit (INDEPENDENT_AMBULATORY_CARE_PROVIDER_SITE_OTHER): Payer: 59 | Admitting: Clinical

## 2022-12-27 DIAGNOSIS — F4310 Post-traumatic stress disorder, unspecified: Secondary | ICD-10-CM | POA: Diagnosis not present

## 2022-12-27 NOTE — Progress Notes (Signed)
Virtual Visit via Video Note   I connected with Laura [name] on 12/27/2022 at  9:00 AM EST by a video enabled telemedicine application and verified that I am speaking with the correct person using two identifiers.   Location: Patient: Home Provider: Office   I discussed the limitations of evaluation and management by telemedicine and the availability of in person appointments. The patient expressed understanding and agreed to proceed.   THERAPIST PROGRESS NOTE   Session Time: 9:00 AM-:9:55 AM   Participation Level: Active   Behavioral Response: CasualAlertDepressed   Type of Therapy: Individual Therapy   Treatment Goals addressed: Coping   Interventions: CBT, DBT, Solution Focused, Strength-based and Supportive   Summary: Laura [name] is a 62 y.o. female who presents with PTSD. The OPT therapist worked with the patient for her ongoing OPT treatment session. The OPT therapist utilized Motivational Interviewing to assist in creating therapeutic repore.The patient in the session was engaged and work in collaboration giving feedback about her triggers and symptoms over the past few weeks.The patient spoke about getting a ear infection and has been recovering from this and is working to get back into her routine at the gym and then she learned in the past week one of the girls she goes to the gyms husband passed away. The OPT therapist utilized Cognitive Behavioral Therapy through cognitive restructuring as well as worked with the patient on coping strategies to assist in management of mental health symptoms. The patient verbalized, "  Currently I am on antibiotics for the ear infection.. I went over to show condolences yesterday and I have to go to my friends funeral on Friday". The OPT therapist worked with the patient on consistency in getting back to her health baseline and working to be consistent with her exercise classes. The OPT therapist overviewed with the patient her basic health  areas including sleep, eating, exercise, and hygiene. The patient noted that she is currently struggling with consistency with her eating habits.The OPT therapist placed emphasis on the patient keeping all upcoming health appointments and adhering to ongoing health treatment recommendations. The OPT therapist reviewed all appointments listed coming up in the patients MyChart including upcoming appointment with Dr. Modesta Messing 12/29/2022 in which she will be going in person and not virtual.    Suicidal/Homicidal: Nowithout intent/plan   Therapist Response:The OPT therapist worked with the patient for the patients scheduled session. The patient was engaged in her session and gave feedback in relation to triggers, symptoms, and behavior responses over the past few weeks. The patient spoke about recovering from a ear infection and being involved attending a funeral this Friday for a friend whose husband passed away.The OPT therapist worked with the patient utilizing an in session Cognitive Behavioral Therapy exercise.  The patient spoke about  not being able to get back to her routine yet with  going to [employer] for her classes on Tuesday and Thursday due to having an ear infection with fluid behind the ear which is throwing off her equilibrium. The patient worked with the [facility] therapist to pre-game for the upcoming funeral being in a small place with a lot of people for this to not trigger her. The patient spoke about her interactions with family and friends over the past few weeks including face-timing with her grandchildren.The OPT therapist worked with the patient overviewing upcoming med management appointment with Dr. Modesta Messing and upcoming appointments as listed in her 27.The OPT therapist will continue treatment work with the patient in her next  scheduled session.   Plan: Return again in 2/3 weeks.   Diagnosis:      Axis I: Post Traumatic Stress Disorder                            Axis II: No diagnosis      Collaboration of Care: Overview with the patient of involvement in the Med Management program with Dr. Modesta Messing.   Patient/Guardian was advised Release of Information must be obtained prior to any record release in order to collaborate their care with an outside provider. Patient/Guardian was advised if they have not already done so to contact the registration department to sign all necessary forms in order for [place] to release information regarding their care.    Consent: Patient/Guardian gives verbal consent for treatment and assignment of benefits for services provided during this visit. Patient/Guardian expressed understanding and agreed to proceed.      I discussed the assessment and treatment plan with the patient. The patient was provided an opportunity to ask questions and all were answered. The patient agreed with the plan and demonstrated an understanding of the instructions.   The patient was advised to call back or seek an in-person evaluation if the symptoms worsen or if the condition fails to improve as anticipated.   I provided 55 minutes of non-face-to-face time during this encounter.   [name], LCSW    12/27/2022

## 2022-12-29 ENCOUNTER — Telehealth (INDEPENDENT_AMBULATORY_CARE_PROVIDER_SITE_OTHER): Payer: 59 | Admitting: Psychiatry

## 2022-12-29 ENCOUNTER — Encounter: Payer: Self-pay | Admitting: Psychiatry

## 2022-12-29 DIAGNOSIS — F330 Major depressive disorder, recurrent, mild: Secondary | ICD-10-CM

## 2022-12-29 DIAGNOSIS — F4310 Post-traumatic stress disorder, unspecified: Secondary | ICD-10-CM

## 2022-12-29 MED ORDER — DIVALPROEX SODIUM ER 500 MG PO TB24: ORAL_TABLET | ORAL | 0 refills | Status: DC

## 2022-12-29 MED ORDER — SERTRALINE HCL 25 MG PO TABS: 25.0000 mg | ORAL_TABLET | Freq: Every day | ORAL | 0 refills | Status: DC

## 2023-01-02 ENCOUNTER — Other Ambulatory Visit: Payer: Self-pay | Admitting: Allergy & Immunology

## 2023-01-03 DIAGNOSIS — E1165 Type 2 diabetes mellitus with hyperglycemia: Secondary | ICD-10-CM | POA: Diagnosis not present

## 2023-01-04 ENCOUNTER — Other Ambulatory Visit (HOSPITAL_COMMUNITY): Payer: Self-pay | Admitting: Nurse Practitioner

## 2023-01-04 ENCOUNTER — Ambulatory Visit (HOSPITAL_COMMUNITY)
Admission: RE | Admit: 2023-01-04 | Discharge: 2023-01-04 | Disposition: A | Discharge status: Home / Self Care | Payer: 59 | Source: Ambulatory Visit | Attending: Nurse Practitioner | Admitting: Nurse Practitioner

## 2023-01-04 DIAGNOSIS — R109 Unspecified abdominal pain: Secondary | ICD-10-CM | POA: Diagnosis not present

## 2023-01-04 DIAGNOSIS — K921 Melena: Secondary | ICD-10-CM

## 2023-01-04 DIAGNOSIS — Z7901 Long term (current) use of anticoagulants: Secondary | ICD-10-CM | POA: Diagnosis not present

## 2023-01-04 MED ORDER — IOHEXOL 350 MG/ML SOLN
80.0000 mL | Freq: Once | INTRAVENOUS | Status: AC | PRN
  Administered 2023-01-04: 80 mL via INTRAVENOUS

## 2023-01-05 ENCOUNTER — Other Ambulatory Visit (HOSPITAL_COMMUNITY): Payer: Self-pay | Admitting: Internal Medicine

## 2023-01-05 DIAGNOSIS — Z1231 Encounter for screening mammogram for malignant neoplasm of breast: Secondary | ICD-10-CM

## 2023-01-06 DIAGNOSIS — D839 Common variable immunodeficiency, unspecified: Secondary | ICD-10-CM | POA: Diagnosis not present

## 2023-01-10 ENCOUNTER — Other Ambulatory Visit: Payer: Self-pay | Admitting: Cardiology

## 2023-01-11 ENCOUNTER — Ambulatory Visit (HOSPITAL_COMMUNITY)
Admission: RE | Admit: 2023-01-11 | Discharge: 2023-01-11 | Disposition: A | Discharge status: Home / Self Care | Payer: 59 | Source: Ambulatory Visit | Attending: Allergy & Immunology | Admitting: Allergy & Immunology

## 2023-01-11 ENCOUNTER — Encounter: Payer: Self-pay | Admitting: Allergy & Immunology

## 2023-01-11 ENCOUNTER — Other Ambulatory Visit: Payer: Self-pay

## 2023-01-11 ENCOUNTER — Ambulatory Visit (INDEPENDENT_AMBULATORY_CARE_PROVIDER_SITE_OTHER): Payer: 59 | Admitting: Allergy & Immunology

## 2023-01-11 VITALS — BP 138/76 | HR 76 | Temp 98.2°F | Resp 20 | Ht 64.0 in | Wt 212.0 lb

## 2023-01-11 DIAGNOSIS — G629 Polyneuropathy, unspecified: Secondary | ICD-10-CM | POA: Diagnosis not present

## 2023-01-11 DIAGNOSIS — J4550 Severe persistent asthma, uncomplicated: Secondary | ICD-10-CM | POA: Diagnosis not present

## 2023-01-11 DIAGNOSIS — J310 Chronic rhinitis: Secondary | ICD-10-CM | POA: Diagnosis not present

## 2023-01-11 DIAGNOSIS — R079 Chest pain, unspecified: Secondary | ICD-10-CM | POA: Diagnosis not present

## 2023-01-11 DIAGNOSIS — R918 Other nonspecific abnormal finding of lung field: Secondary | ICD-10-CM | POA: Diagnosis not present

## 2023-01-11 DIAGNOSIS — K219 Gastro-esophageal reflux disease without esophagitis: Secondary | ICD-10-CM

## 2023-01-11 DIAGNOSIS — D839 Common variable immunodeficiency, unspecified: Secondary | ICD-10-CM

## 2023-01-11 DIAGNOSIS — R0602 Shortness of breath: Secondary | ICD-10-CM | POA: Diagnosis not present

## 2023-01-11 MED ORDER — PREDNISONE 10 MG PO TABS: ORAL_TABLET | ORAL | 0 refills | Status: DC

## 2023-01-11 NOTE — Progress Notes (Signed)
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 Gerilyn [place]quah, but wants to consolidate care at [facility]   Tremors - thought to be medication induced (followed by [facility])   Headaches (followed by [facility])   Non-allergic rhinitis   Paroxysmal atrial fibrillation - followed by Cardiology   Enlarged lymph nodes on chest CT - with no mention of them on a recent chest CT in June 2023 (but they are mentioned in an April chest CT)  Eustachian tube dysfunction  Plan/Recommendations:   1. Hypogammaglobulinemia - Continue with Cuvitru.  - We will continue with 27 g every 2 weeks. - We might consider increasing the dosage if this frequency of infections worsen.   2. Chronic rhinitis   - Continue with nasal saline rinses as needed.  - Continue with Nasacort daily as instructed by your primary care provider.  - You can use this with your Astepro/azelastine. - We can do skin testing at some point if needed.   3. Severe persistent asthma, uncomplicated - Lung testing looks decent today (essentially stable from last time)  - I think that this is likely pleurisy, so let's start a prednisone taper.  - We are getting a chest X-ray to rule out a pneumonia.  - We are going to g - Daily medication: Trelegy 100mcg one puff once daily + Fasenra every 8 weeks - Prior to physical activity: Xopenex 2 puffs 10-15 minutes before physical activity. - Rescue medications: Xopenex nebulizer every 4-6 hours as needed, Xopenex 4 puffs every 4-6 hours as needed, or DuoNeb nebulizer one vial every 4-6 hours as needed - Asthma control goals:  * Full participation in all desired activities (may need  albuterol before activity) * Albuterol use two time or less a week on average (not counting use with activity) * Cough interfering with sleep two time or less a month * Oral steroids no more than once a year * No hospitalizations.  4. Return in about 3 months (around 04/12/2023).  Subjective:   Laura [name] is a 62 y.o. adult presenting today for follow up of  Chief Complaint  Patient presents with   Follow-up    Pt states the last pass week she has been coughing a lot she has been sick with ear infection back in march.    Laura Radamara Blum has a history of the following: Patient Active Problem List   Diagnosis Date Noted   Acquired hallux valgus of right foot 07/29/2022   Hammer toe of second toe of right foot 07/29/2022   Joint contracture of foot, right 07/29/2022   Neuropathy 07/29/2022   Chest pain 02/08/2022   Syncope 02/07/2022   Class 2 severe obesity due to excess calories with serious comorbidity and body mass index (BMI) of 35.0 to 35.9 in adult 01/14/2022   Acute cough 12/31/2021   Common variable immunodeficiency 12/31/2021   Vitamin B12 deficiency 05/21/2021   Vitamin D deficiency 05/21/2021   Abdominal pain 04/27/2021   Urinary incontinence 04/27/2021   Dysphagia 04/27/2021   Constipation 04/27/2021   Mixed 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 04/10/2021   Gastroesophageal reflux disease 04/10/2021   Adnexal cyst 01/19/2021   Chronic RLQ pain 01/19/2021   Mixed  stress and urge urinary incontinence 01/19/2021   Other fatigue 12/10/2020   Abdominal pain, chronic, right lower quadrant 12/08/2020   Abnormal weight loss 12/08/2020   Poor appetite 12/08/2020   Severe persistent asthma with acute exacerbation 10/01/2019   Abnormal EKG    Chronic diastolic (congestive) heart failure 08/29/2019   Shortness of breath 08/29/2019   Musculoskeletal chest pain 08/28/2019   Headache 08/10/2018   H/O atrial flutter 08/10/2018   OSA on CPAP  08/10/2018   Anxiety 08/10/2018   Uncontrolled type 2 diabetes mellitus with hyperglycemia 03/13/2018   Hypothyroidism 03/13/2018   Essential hypertension, benign 03/13/2018   Mixed hyperlipidemia 03/13/2018   Hypogammaglobulinemia 02/06/2018   Non-allergic rhinitis 02/06/2018   Severe persistent asthma, uncomplicated 02/06/2018   Pulmonary nodules 02/06/2018   Bronchiectasis without complication 02/06/2018   Type 2 diabetes mellitus with hyperglycemia, with long-term current use of insulin 02/06/2018    History obtained from: chart review and patient.  Charlaine is a 62 y.o. adult presenting for a follow up visit.  She had norovirus in October and this improved. She went to somone at Dr. Scharlene Gloss office and was diagnosed with AOM. She also had a cyst on her back. She was placed on doxycycline for this and it was recommended that see his Audiologist and then she was referred to see ENT. She sees [facility] and liked whoever she saw. She was diagnosed with fluid behind her left ear drum. This was felt to be secondary to a preceding viral URI leading to inflammation of her nasal passages and blockage of her Eustachian tube.   She also went to see her dentist. She was diagnosed with an abscess and she had five teeth that were bothering her. She was referred to see na oral surgeon in [place] on July 17th. She completed the doxycycline and is on amoxicillin for the dental abscess. Her teeth now and not bothering her, but they are just "sensitive" at this point. They have not truly been painful even before the amoxicillin.   She is having blood in her stool and she ended up having a CT of her abdomen and pelvis. She was diagnosed with diverticulosis which she knew about for years now.   She has been coughing a lot recently. She has been coughing for a while at this point. It is sometimes productive. The last few days have been bad. She has been having a lot of pain at her left costophrenic angle.  She has some pain in her chest and shoulder as well. She is not allowed to take any NSAIDs due to her blood thinner. She has been having platelets that have been trending downwards. She had a platelet count at 148. She reports a lost of appetite and lost 7 pounds this week. She had her platelets done again at her PCP's in the last week or so.   Asthma/Respiratory Symptom History: She is no longer doing the Trelegy. We gave her a sample and she never tried it. She remains on the [facility] and this is covered by her insurance. She has a generic one available today and she wants to make sure with me that it is the same thing.  She is attending classes at the [facility] weekly. She seems to be enjoying this quite a bit.   She follows with Dr. Vassie Loll who manages her CPAP machine.  She last saw him in January 2024.  She had a sleep study that was apparently normal which was thought to be secondary  to a weight loss of 40 to 50 pounds since her prior study.  However, she was reporting improvement in her wellbeing with the CPAP and she wanted to continue it.    She continues to follow with Dr. Wyline MoodBranch with Cardiology. 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 She also sees Dr. Gilman SchmidtGregory Taylor in Cardiology as well.   Allergic Rhinitis Symptom History: She was started on Nasacort by the ENT. Her PCP gave her a sample of Astepro.  She has never had positive testing (only done via the blood). We have never done skin testing since this has never been a primary complaint of hers.   She continues with her infusions.  These are going well without a problem.  She has not had any breakthrough infections since we saw her last time. She receives 27 grams every two weeks. She is tolerating these without a problem. She has been on this dose since August 2023.   She did have a nerve conduction study that was normal. She remains on gabapentin for her restless legs syndrome.   Otherwise, there  have been no changes to her past medical history, surgical history, family history, or social history.    Review of Systems  Constitutional: Negative.  Negative for chills, fever, malaise/fatigue and weight loss.  HENT:  Positive for congestion. Negative for ear discharge, ear pain and sinus pain.   Eyes:  Negative for pain, discharge and redness.  Respiratory:  Negative for cough, sputum production, shortness of breath, wheezing and stridor.        Positive for chest heaviness.   Cardiovascular: Negative.  Negative for chest pain and palpitations.  Gastrointestinal:  Negative for abdominal pain, constipation, diarrhea, heartburn, nausea and vomiting.  Skin: Negative.  Negative for itching and rash.  Neurological:  Negative for dizziness and headaches.  Endo/Heme/Allergies:  Negative for environmental allergies. Does not bruise/bleed easily.       Objective:   Blood pressure 138/76, pulse 76, temperature 98.2 F (36.8 C), resp. rate 20, height 5\' 4"  (1.626 m), weight 212 lb (96.2 kg), SpO2 95 %. Body mass index is 36.39 kg/m.    Physical Exam Vitals reviewed.  Constitutional:      Appearance: She is well-developed and overweight.     Comments: Very talkative. Generally seems fairly happy.   HENT:     Head: Normocephalic and atraumatic.     Right Ear: Tympanic membrane, ear canal and external ear normal.     Left Ear: Tympanic membrane, ear canal and external ear normal.     Nose: No nasal deformity, septal deviation, mucosal edema or rhinorrhea.     Right Turbinates: Enlarged, swollen and pale.     Left Turbinates: Enlarged, swollen and pale.     Right Sinus: No maxillary sinus tenderness or frontal sinus tenderness.     Left Sinus: No maxillary sinus tenderness or frontal sinus tenderness.     Comments: No nasal polyps noted.     Mouth/Throat:     Lips: Pink.     Mouth: Mucous membranes are moist. Mucous membranes are not pale and not dry.     Pharynx: Uvula midline.   Eyes:     General: Lids are normal. Allergic shiner present.        Right eye: No discharge.        Left eye: No discharge.     Conjunctiva/sclera: Conjunctivae normal.     Right eye:  Right conjunctiva is not injected. No chemosis.    Left eye: Left conjunctiva is not injected. No chemosis.    Pupils: Pupils are equal, round, and reactive to light.  Cardiovascular:     Rate and Rhythm: Normal rate and regular rhythm.     Heart sounds: Normal heart sounds.  Pulmonary:     Effort: Pulmonary effort is normal. No tachypnea, accessory muscle usage or respiratory distress.     Breath sounds: Normal breath sounds. No wheezing, rhonchi or rales.     Comments: Moving air well in all lung fields. No increased work of breathing noted.  Chest:     Chest wall: No tenderness.  Lymphadenopathy:     Cervical: No cervical adenopathy.  Skin:    Coloration: Skin is not pale.     Findings: No abrasion, erythema, petechiae or rash. Rash is not papular, urticarial or vesicular.  Neurological:     Mental Status: She is alert.     Comments: Hand tremors are much better than before.   Psychiatric:        Behavior: Behavior is cooperative.      Diagnostic studies:    Spirometry: results normal (FEV1: 1.70/69%, FVC: 2.37/76%, FEV1/FVC: 72%).    Spirometry consistent with normal pattern.   Allergy Studies: none       Malachi Bonds, MD  [facility]

## 2023-01-11 NOTE — Patient Instructions (Addendum)
1. Hypogammaglobulinemia - Continue with Cuvitru.  - We will continue with 27 g every 2 weeks. - We might consider increasing the dosage if this frequency of infections worsen.   2. Chronic rhinitis   - Continue with nasal saline rinses as needed.  - Continue with Nasacort daily as instructed by your primary care provider.  - You can use this with your Astepro/azelastine. - We can do skin testing at some point if needed.   3. Severe persistent asthma, uncomplicated - Lung testing looks decent today (essentially stable from last time)  - I think that this is likely pleurisy, so let's start a prednisone taper.  - We are getting a chest X-ray to rule out a pneumonia.  - We are going to g - Daily medication: Trelegy 162mcg one puff once daily + Fasenra every 8 weeks - Prior to physical activity: Xopenex 2 puffs 10-15 minutes before physical activity. - Rescue medications:  Xopenex nebulizer every 4-6 hours as needed, Xopenex 4 puffs every 4-6 hours as needed, or DuoNeb nebulizer one vial every 4-6 hours as needed - Asthma control goals:  * Full participation in all desired activities (may need albuterol before activity) * Albuterol use two time or less a week on average (not counting use with activity) * Cough interfering with sleep two time or less a month * Oral steroids no more than once a year * No hospitalizations.  4. Return in about 3 months (around 04/12/2023).    Please inform us of any Emergency Department visits, hospitalizations, or changes in symptoms. Call us before going to the ED for breathing or allergy symptoms since we might be able to fit you in for a sick visit. Feel free to contact us anytime with any questions, problems, or concerns.  It was a pleasure to see you again today!  Websites that have reliable patient information: 1. American Academy of Asthma, Allergy, and Immunology: www.aaaai.org 2. Food Allergy Research and Education (FARE): foodallergy.org 3.  Mothers of Asthmatics: http://www.asthmacommunitynetwork.org 4. American College of Allergy, Asthma, and Immunology: www.acaai.org   COVID-19 Vaccine Information can be found at: ShippingScam.co.uk For questions related to vaccine distribution or appointments, please email vaccine@[email or web address] .[email or web address] or call [phone].     "Like" [place] on Facebook and Instagram for our latest updates!       Make sure you are registered to vote! If you have moved or changed any of your contact information, you will need to get this updated before voting!  In some cases, you MAY be able to register to vote online: [email or web address]

## 2023-01-12 DIAGNOSIS — G4733 Obstructive sleep apnea (adult) (pediatric): Secondary | ICD-10-CM | POA: Diagnosis not present

## 2023-01-13 ENCOUNTER — Telehealth: Payer: Self-pay | Admitting: Cardiology

## 2023-01-13 NOTE — Telephone Encounter (Signed)
Pt states that she has not been feeling well lately d/t the weather. Pt is currently taking prednisone. She states that her BP is normally 160 / ?. Pt denies dizziness and states other than her asthma she feels fine. Please advise.

## 2023-01-13 NOTE — Telephone Encounter (Signed)
Pt c/o BP issue: STAT if pt c/o blurred vision, one-sided weakness or slurred speech  1. What are your last 5 BP readings?  117/47 130/56 126/54  2. Are you having any other symptoms (ex. Dizziness, headache, blurred vision, passed out)? None   3. What is your BP issue? Pt said, her BP is running low, she said she feel ok but she anted to check in with Dr. Wyline Mood

## 2023-01-16 ENCOUNTER — Encounter: Payer: Self-pay | Admitting: Allergy & Immunology

## 2023-01-17 NOTE — Telephone Encounter (Signed)
BP's are fine, would not consider too low unless the top number was in the 90s. The bottom number sometimes can be inaccurate by electric bp cuffs. If no symptoms would not make any changes. Sometimes  prednisone will temporarily cause high bp's   Dominga Ferry MD

## 2023-01-17 NOTE — Telephone Encounter (Signed)
Patient notified and verbalized understanding. Patient had no questions or concerns at this time.  

## 2023-01-20 ENCOUNTER — Other Ambulatory Visit: Payer: Self-pay | Admitting: "Endocrinology

## 2023-01-20 DIAGNOSIS — D839 Common variable immunodeficiency, unspecified: Secondary | ICD-10-CM | POA: Diagnosis not present

## 2023-01-20 DIAGNOSIS — E1165 Type 2 diabetes mellitus with hyperglycemia: Secondary | ICD-10-CM

## 2023-01-23 ENCOUNTER — Ambulatory Visit: Payer: 59 | Attending: Cardiology | Admitting: Cardiology

## 2023-01-23 ENCOUNTER — Encounter: Payer: Self-pay | Admitting: Cardiology

## 2023-01-23 VITALS — BP 140/64 | HR 66 | Ht 64.0 in | Wt 222.8 lb

## 2023-01-23 DIAGNOSIS — I5032 Chronic diastolic (congestive) heart failure: Secondary | ICD-10-CM | POA: Diagnosis not present

## 2023-01-23 DIAGNOSIS — I495 Sick sinus syndrome: Secondary | ICD-10-CM

## 2023-01-23 DIAGNOSIS — I10 Essential (primary) hypertension: Secondary | ICD-10-CM

## 2023-01-23 DIAGNOSIS — I4891 Unspecified atrial fibrillation: Secondary | ICD-10-CM

## 2023-01-23 DIAGNOSIS — R55 Syncope and collapse: Secondary | ICD-10-CM | POA: Diagnosis not present

## 2023-01-23 NOTE — Progress Notes (Signed)
Clinical Summary Ms. Vanblarcom is a 62 y.o.adult seen today for follow up of the following medical problems.      1.Chest pain  08/2019 cath LV end diastolic pressure is mildly elevated. There is no aortic valve stenosis. Aortic atherosclerosis/calcification noted. No angiographically apparently CAD.     -ER visit 03/18/22 with constant chest pain worst with position, from notes reproducible on exam - trops neg, EKG no specific ischemic changes, CT chest benign.  - sitting in living sharp left sided, 6/10 in severity. No other associated symptoms. Radiated in left shoulder and neck. Pain worst with movement. Pain would come and go. Able sleep. Woke recurrent symptoms, checked bp and it was elevated to 200. Tender to palpation.   - no recent chest pains      2.Orthostatic syncope - prior issues with orthostatic syncope - at prior office visit so severely orthostatic could not get up from exam table, EMS called - have cut back her diuretic, bp meds   Depakote 12-25% dizziness, 1-5% orthostatic hypotension.  Gabapentin dizziness 17-28%  Imipramine: orthostatic hypotension Topamax: dizziness 14% Reglan: dizziness Jan 2023 HgbA1c 7.8, previously 8    - no change depakote at least 1 year, gabapentin was increased about 6 months, no changes in topamax,    -no recent symptoms. Has done very well     2. Persistent afib/Tachy brady - admission with cardiogenic syncope, bradycardia - weaned av nodal agents. Issues with tachy and brady/pauses - had pacemaker placed 03/03/22 - given palpitations we restarted her bisoprolol at 2.5mg  on 03/24/22 - ongoing palpitations, not improved.  -no bleeding on eliquis.     - rare palpitations, overall not bothersome      3. HTN - careful management given issues with orthostatic hypotension in the past  - home bp's 110s-130s/50. Very rare sbp 160s recorded   4 LE edema/Chronic diastolic HF - 08/2019 echo LVEF 60-65%, elevated LVEDP,  mild RV dysfunction, mild to mod LAE,  11/2020 echo LVEF 65-70%, indet diastolic, low normal RV function - we changed torsemide to just prn given prior orthostatic symptoms, has not needed in some time.     - no recent edema, has not needed her prn torsemide     5. Prior ASD repair   6. Asthma/Bronchitis - recent admission 04/2021 - followed by Dr Vassie Loll   7. COVID + in Jan 2022 - recurrent infection 05/2021   Past Medical History:  Diagnosis Date   Anemia    Aortic atherosclerosis    Asthma    Atrial fibrillation and flutter    a.) CHA2DS2VASc = 6 (sex, HTN, TIA x2, vascular disease history, T2DM);  b.) DCCV ~ 2009; c.) DCCV 08/29/2018 (200J x1 --> SR with 1st degree AVB); d.) rate/rhythm maintained on oral diltiazem + bisoprolol; chronically anticoagulated with apixaban   Bipolar disorder    Carpal tunnel syndrome of right wrist    CHB (complete heart block)    a.) s/p St. Jude dual chamber PPM placement   CHF (congestive heart failure)    Complication of anesthesia    a.) "allergic reaction" to perioperative analgesics + anesthetic medications in [place]; unsure of combination; not able to recount reactions/events --> states "I ended up in the ICU for 5 days"   Constipation    a.) on linaclotide   DDD (degenerative disc disease), thoracic    GERD (gastroesophageal reflux disease)    H/O congenital atrial septal defect (ASD) repair    Hallux valgus  of right foot    Hammertoe of second toe of right foot    History of 2019 novel coronavirus disease (COVID-19)    a.) 10/2020; b.) 05/2021   HLD (hyperlipidemia)    HTN (hypertension)    Hypogammaglobulinemia    a.) on Curitru   Hypothyroid    IgE deficiency    Long term current use of anticoagulant    a.) apixaban   Lumbar stenosis    Migraines    Mitral stenosis 02/08/2022   a.) TTE 02/08/2022: EF 60-65%, mild MAC, mild MV stenosis (peak grad 14.6 / mean grad 4.0)   Neuropathy    NSVT (nonsustained ventricular  tachycardia) 10/31/2018   a.) Holter 10/31/2018: 6 beat run   Obesity    OSA on CPAP    Osteoarthritis    Presence of permanent cardiac 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 Jude dual chamber device; placed for symptomatic bradycardia secondary to intermittent CHB   PTSD (post-traumatic stress disorder)    Pulmonary nodules    Recurrent sinusitis    Short-term memory loss    Sleep difficulties    a.) takes melatonin   Symptomatic bradycardia    a.) s/p St. Jude dual chamber PPM placement   Syncope    TIA (transient ischemic attack)    x3-last one in 2015   Tremors of nervous system    Type 2 diabetes mellitus      Allergies  Allergen Reactions   Ativan [Lorazepam] Hives   Phenergan [Promethazine Hcl] Hives     Current Outpatient Medications  Medication Sig Dispense Refill   albuterol (PROVENTIL) (2.5 MG/3ML) 0.083% nebulizer solution Take 3 mLs (2.5 mg total) by nebulization every 4 (four) hours as needed for wheezing or shortness of breath. 75 mL 1   albuterol (VENTOLIN HFA) 108 (90 Base) MCG/ACT inhaler INHALE 2 PUFFS INTO THE LUNGS EVERY 6 HOURS AS NEEDED FOR WHEEZING OR SHORTNESS OF BREATH 18 g 1   amoxicillin (AMOXIL) 500 MG tablet TAKE 1 TABLET BY MOUTH THREE TIMES DAILY UNTIL GONE     apixaban (ELIQUIS) 5 MG TABS tablet TAKE ONE TABLET BY MOUTH TWICE DAILY @ 9AM & 5PM 180 tablet 1   atorvastatin (LIPITOR) 10 MG tablet Take 10 mg by mouth at bedtime.     BD PEN NEEDLE NANO 2ND GEN 32G X 4 MM MISC 1 each by Other route in the morning, at noon, in the evening, and at bedtime. 90 each 0   Benralizumab (FASENRA PEN) 30 MG/ML SOAJ Inject 1 mL (30 mg total) into the skin every 28 (twenty-eight) days. Then every 8 weeks 1 mL 8   bisoprolol (ZEBETA) 10 MG tablet Take 1 tablet (10 mg total) by mouth daily. (Patient taking differently: Take 10 mg by mouth every morning.) 90 tablet 3   blood glucose meter kit and supplies 1 each by Other route 4 (four) times daily. Dispense based on  patient and insurance preference. Use up to four times daily as directed. (FOR ICD-10 E10.9, E11.9). 1 each 0   BREO ELLIPTA 100-25 MCG/ACT AEPB INHALE 1 PUFF INTO LUNGS DAILY (BULK) 60 each 5   Cholecalciferol (VITAMIN D3) 125 MCG (5000 UT) CAPS Take 1 capsule (5,000 Units total) by mouth daily. 90 capsule 0   CINNAMON PO Take 1 tablet by mouth daily at 6 (six) AM.     Continuous Blood Gluc Sensor (FREESTYLE LIBRE 3 SENSOR) MISC 1 Piece by Does not apply route every 14 (fourteen) days. Place 1 sensor  on the skin every 14 days. Use to check glucose continuously 6 each 2   CUVITRU 1 GM/5ML SOLN Inject into the skin.     CUVITRU 10 GM/50ML SOLN Inject 25 mg into the skin every 14 (fourteen) days.     CUVITRU 2 GM/10ML SOLN Inject into the skin.     CUVITRU 4 GM/20ML SOLN Inject into the skin.     diltiazem (CARDIZEM CD) 180 MG 24 hr capsule TAKE ONE CAPSULE BY MOUTH DAILY AT 9AM 90 capsule 3   diltiazem (CARDIZEM CD) 300 MG 24 hr capsule Take 1 capsule (300 mg total) by mouth daily. (Patient taking differently: Take 300 mg by mouth every morning.) 90 capsule 3   divalproex (DEPAKOTE ER) 500 MG 24 hr tablet Take 1 tablet (500 mg total) by mouth daily AND 2 tablets (1,000 mg total) at bedtime. 270 tablet 0   ELDERBERRY PO Take 4 g by mouth daily. Gummies 2g each     EPINEPHrine 0.3 mg/0.3 mL IJ SOAJ injection Inject 0.3 mg into the muscle once as needed for anaphylaxis.     ferrous sulfate 324 MG TBEC Take 324 mg by mouth every evening.     gabapentin (NEURONTIN) 600 MG tablet Take 600 mg by mouth 3 (three) times daily.     hydrOXYzine (ATARAX) 25 MG tablet TAKE ONE TABLET BY MOUTH THREE TIMES DAILY AS NEEDED FOR ANXIETY OR FOR ITCHING (VIAL) 30 tablet 5   Immune Globulin, Human, (CUVITRU [place]) Inject 27 mg into the skin every 14 (fourteen) days.     insulin glargine (LANTUS SOLOSTAR) 100 UNIT/ML Solostar Pen Inject 40 Units into the skin at bedtime. 45 mL 0   Insulin Pen Needle (PEN NEEDLES 3/16") 31G  X 5 MM MISC Use as directed with insulin pen 100 each 0   levalbuterol (XOPENEX HFA) 45 MCG/ACT inhaler INHALE TWO PUFFS INTO LUNGS EVERY 6 HOURS AS NEEDED FOR WHEEZING (BULK) 15 g 11   levalbuterol (XOPENEX) 1.25 MG/3ML nebulizer solution Inhale 1.25 mg into the lungs daily as needed (asthma).     levothyroxine (SYNTHROID, LEVOTHROID) 112 MCG tablet Take 112 mcg by mouth daily before breakfast.     linaclotide (LINZESS) 145 MCG CAPS capsule TAKE ONE CAPSULE BY MOUTH DAILY BEFORE BREAKFAST 90 capsule 3   losartan (COZAAR) 50 MG tablet Take 50 mg by mouth every morning.     MELATONIN GUMMIES PO Take 10 mg by mouth at bedtime.     metFORMIN (GLUCOPHAGE) 500 MG tablet Take 500 mg by mouth 2 (two) times daily.     montelukast (SINGULAIR) 10 MG tablet Take 10 mg by mouth at bedtime.     Multiple Vitamins-Minerals (MULTIVITAMIN WITH MINERALS) tablet Take 1 tablet by mouth daily. Woman     Nebulizer MISC Nebulizer tubing kit 2 each 5   omeprazole (PRILOSEC) 20 MG capsule TAKE ONE CAPSULE BY MOUTH TWICE DAILY @ 9AM & 5PM BEOFRE MEALS 60 capsule 2   ondansetron (ZOFRAN-ODT) 8 MG disintegrating tablet Take 8 mg by mouth daily as needed for vomiting or nausea.     ONETOUCH ULTRA test strip 1 each by Other route in the morning, at noon, in the evening, and at bedtime.     polyethylene glycol (MIRALAX / GLYCOLAX) 17 g packet Take 17 g by mouth daily as needed. (Patient taking differently: Take 17 g by mouth daily as needed for moderate constipation or severe constipation.) 14 each 0   Potassium Chloride ER 20 MEQ TBCR Take 20  mEq by mouth daily.     predniSONE (DELTASONE) 10 MG tablet Take two tablets ( ) twice daily for five days, then one tablet ( ) twice daily for five days, then one tablet ( ) daily for five days then STOP. 35 tablet 0   sertraline (ZOLOFT) 25 MG tablet Take 1 tablet (25 mg total) by mouth at bedtime. 90 tablet 0   Spacer/Aero-Holding Chambers (AEROCHAMBER PLUS) inhaler 1 each by  Other route as needed for other. Use as instructed 1 each 0   topiramate (TOPAMAX) 50 MG tablet Take 1 tablet (50 mg total) by mouth daily. 90 tablet 2   torsemide (DEMADEX) 20 MG tablet TAKE ONE TABLET BY MOUTH DAILY FOR FLUID GAIN OF 3LBS OVERNIGHT OR 5LBS IN A WEEK (VIAL) 90 tablet 1   triamcinolone cream (KENALOG) 0.1 % Apply 1 application. topically daily as needed (foot).     TRULICITY 3 MG/0.5ML SOPN INJECT  AS DIRECTED ONCE A WEEK (BULk0 6 mL 0   TURMERIC PO Take 1 tablet by mouth daily at 6 (six) AM.     vitamin C (ASCORBIC ACID) 500 MG tablet Take 500 mg by mouth daily. Power C immune support     Current Facility-Administered Medications  Medication Dose Route Frequency Provider Last Rate Last Admin   Benralizumab SOSY 30 mg  30 mg Subcutaneous Q28 days Hetty Blend, FNP   30 mg at 12/27/21 1603     Past Surgical History:  Procedure Laterality Date   ASD REPAIR  1968   BIOPSY  01/26/2021   Procedure: BIOPSY;  Surgeon: Lanelle Bal, DO;  Location: [facility];  Service: Endoscopy;;  gastric   CARDIOVERSION  2009   CARDIOVERSION N/A 08/29/2018   Procedure: CARDIOVERSION;  Surgeon: Antoine Poche, MD;  Location: [facility];  Service: Endoscopy;  Laterality: N/A;   CARPAL TUNNEL RELEASE Bilateral    CESAREAN SECTION  1986, 1988, 1994   COLONOSCOPY WITH PROPOFOL N/A 01/26/2021   Procedure: COLONOSCOPY WITH PROPOFOL;  Surgeon: Lanelle Bal, DO;  Location: [facility];  Service: Endoscopy;  Laterality: N/A;  am appt, diabetic   ESOPHAGOGASTRODUODENOSCOPY (EGD) WITH PROPOFOL N/A 01/26/2021   Procedure: ESOPHAGOGASTRODUODENOSCOPY (EGD) WITH PROPOFOL;  Surgeon: Lanelle Bal, DO;  Location: [facility];  Service: Endoscopy;  Laterality: N/A;   HALLUX VALGUS [place] Right 07/29/2022   Procedure: HALLUX VALGUS;  Surgeon: Edwin Cap, DPM;  Location: [facility];  Service: Podiatry;  Laterality: Right;   HAMMER TOE SURGERY Right 07/29/2022   Procedure:  HAMMER TOE CORRECTION 2ND;  Surgeon: Edwin Cap, DPM;  Location: [facility];  Service: Podiatry;  Laterality: Right;   LEFT HEART CATH AND CORONARY ANGIOGRAPHY N/A 08/30/2019   Procedure: LEFT HEART CATH AND CORONARY ANGIOGRAPHY;  Surgeon: Corky Crafts, MD;  Location: [facility];  Service: Cardiovascular;  Laterality: N/A;   NASAL SINUS SURGERY  2017   PACEMAKER IMPLANT N/A 03/03/2022   Procedure: PACEMAKER IMPLANT;  Surgeon: Marinus Maw, MD;  Location: [facility] CV [facility];  Service: Cardiovascular;  Laterality: N/A;   POLYPECTOMY  01/26/2021   Procedure: POLYPECTOMY;  Surgeon: Lanelle Bal, DO;  Location: [facility];  Service: Endoscopy;;  colon     Allergies  Allergen Reactions   Ativan [Lorazepam] Hives   Phenergan [Promethazine Hcl] Hives      Family History  Problem Relation Age of Onset   Hypertension Mother    Stroke Mother    Kidney disease  Mother    Heart attack Mother    Alcohol abuse Mother    Other Father        ?colon cancer   Kidney disease Sister    Hypertension Sister    Bipolar disorder Sister    Atrial fibrillation Sister    Heart disease Brother    Prostate cancer Brother    Thyroid disease Daughter    Hashimoto's thyroiditis Daughter    Celiac disease Daughter    Allergic rhinitis Daughter    Stroke Maternal Aunt    Heart disease Maternal Aunt    Asthma Neg Hx      Social History Ms. Lei reports that she quit smoking about 16 years ago. Her smoking use included cigarettes. She started smoking about 44 years ago. She has a 28.00 pack-year smoking history. She has never used smokeless tobacco. Ms. Matthew reports that she does not currently use alcohol.   Review of Systems CONSTITUTIONAL: No weight loss, fever, chills, weakness or fatigue.  HEENT: Eyes: No visual loss, blurred vision, double vision or yellow sclerae.No hearing loss, sneezing, congestion, runny nose or sore throat.  SKIN: No rash or itching.   CARDIOVASCULAR: per hpi RESPIRATORY: No shortness of breath, cough or sputum.  GASTROINTESTINAL: No anorexia, nausea, vomiting or diarrhea. No abdominal pain or blood.  GENITOURINARY: No burning on urination, no polyuria NEUROLOGICAL: No headache, dizziness, syncope, paralysis, ataxia, numbness or tingling in the extremities. No change in bowel or bladder control.  MUSCULOSKELETAL: No muscle, back pain, joint pain or stiffness.  LYMPHATICS: No enlarged nodes. No history of splenectomy.  PSYCHIATRIC: No history of depression or anxiety.  ENDOCRINOLOGIC: No reports of sweating, cold or heat intolerance. No polyuria or polydipsia.  Marland Kitchen   Physical Examination Today's Vitals   01/23/23 0948  BP: (!) 140/64  Pulse: 66  SpO2: 97%  Weight: 222 lb 12.8 oz (101.1 kg)  Height: 5\' 4"  (1.626 m)   Body mass index is 38.24 kg/m.  Gen: resting comfortably, no acute distress HEENT: no scleral icterus, pupils equal round and reactive, no palptable cervical adenopathy,  CV: RRR, no m/rg, no jvd Resp: Clear to auscultation bilaterally GI: abdomen is soft, non-tender, non-distended, normal bowel sounds, no hepatosplenomegaly MSK: extremities are warm, no edema.  Skin: warm, no rash Neuro:  no focal deficits Psych: appropriate affect   Diagnostic Studies  08/2019 cath LV end diastolic pressure is mildly elevated. There is no aortic valve stenosis. Aortic atherosclerosis/calcification noted. No angiographically apparently CAD.     08/2019 echo 1. Left ventricular ejection fraction, by visual estimation, is 60 to  65%. The left ventricle has normal function. There is mildly increased  left ventricular hypertrophy.   2. Elevated left ventricular end-diastolic pressure.   3. Left ventricular diastolic function could not be evaluated.   4. Global right ventricle has mildly reduced systolic function.The right  ventricular size is normal. No increase in right ventricular wall  thickness.   5.  Left atrial size was mild-moderately dilated.   6. Right atrial size was normal.   7. Moderate to severe mitral annular calcification.   8. The mitral valve is degenerative. Trace mitral valve regurgitation.   9. The tricuspid valve is grossly normal. Tricuspid valve regurgitation  is trivial.  10. The aortic valve is tricuspid. Aortic valve regurgitation is not  visualized. Mild aortic valve sclerosis without stenosis.  11. The pulmonic valve was grossly normal. Pulmonic valve regurgitation is  not visualized.  12. The inferior vena cava is  normal in size with greater than 50%  respiratory variability, suggesting right atrial pressure of 3 mmHg.       Assessment and Plan   1. Tachy brady/afib now with pacemaker/acquired thrombophilia - overall doing well, continue current meds including eliquis for stroke prevention   3. Chronic diastolic HF -not requiring prn torsemide at all, we will d/c - continue to monitor   4. Orthostatic syncope -much improved, continuing to stay hydrated     5. HTN - mildly elevated here but home bp's at goal - continue current meds     Antoine Poche, M.D.

## 2023-01-23 NOTE — Patient Instructions (Signed)
Medication Instructions:  Your physician has recommended you make the following change in your medication:   -Stop Torsemide  *If you need a refill on your cardiac medications before your next appointment, please call your pharmacy*   Lab Work: None If you have labs (blood work) drawn today and your tests are completely normal, you will receive your results only by: MyChart Message (if you have MyChart) OR A paper copy in the mail If you have any lab test that is abnormal or we need to change your treatment, we will call you to review the results.   Testing/Procedures: None   Follow-Up: At [facility], you and your health needs are our priority.  As part of our continuing mission to provide you with exceptional heart care, we have created designated Provider Care Teams.  These Care Teams include your primary Cardiologist (physician) and Advanced Practice Providers (APPs -  Physician Assistants and Nurse Practitioners) who all work together to provide you with the care you need, when you need it.  We recommend signing up for the patient portal called "MyChart".  Sign up information is provided on this After Visit Summary.  MyChart is used to connect with patients for Virtual Visits (Telemedicine).  Patients are able to view lab/test results, encounter notes, upcoming appointments, etc.  Non-urgent messages can be sent to your provider as well.   To learn more about what you can do with MyChart, go to [email or web address].    Your next appointment:   6 month(s)  Provider:   Dina Rich, MD    Other Instructions

## 2023-01-27 ENCOUNTER — Ambulatory Visit (INDEPENDENT_AMBULATORY_CARE_PROVIDER_SITE_OTHER): Payer: 59 | Admitting: Physician Assistant

## 2023-01-27 ENCOUNTER — Encounter: Payer: Self-pay | Admitting: Physician Assistant

## 2023-01-27 VITALS — Ht 64.0 in

## 2023-01-27 DIAGNOSIS — R202 Paresthesia of skin: Secondary | ICD-10-CM | POA: Diagnosis not present

## 2023-01-27 DIAGNOSIS — R251 Tremor, unspecified: Secondary | ICD-10-CM | POA: Diagnosis not present

## 2023-01-27 MED ORDER — TOPIRAMATE 50 MG PO TABS: 50.0000 mg | ORAL_TABLET | Freq: Every day | ORAL | 3 refills | Status: DC

## 2023-01-27 NOTE — Progress Notes (Signed)
Assessment/Plan:   Paresthesias of the hands  Negative NCS/EMG. Labs unremarkable ( TSH, B12, B1, sed rate, CRP, copper, SPEP with immunofixation, folate, ANA). No neuropathy noted    2.  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 She is on both, psychiatric tremor inducing meds (Depakote) as well as asthma meds. In addition she has anxiety which may contribute to the signs and symptoms Exam essentially unchanged from prior. No other Parkinsonian signs.   Continue topiramate  150 mg in the AM and continue 100 in the PM as this is therapeutic  Follow up in 1 year    3.  Cervical spinal stenosis             -following with Dr. Franky Macho, not a candidate for surgery as per MD     4. Follow up with her PCP on her other medical issues    Subjective:   Numbness and tingling at the tip of her hands has not resolved, despite normal NCS/EMG.  The numbness is without radiation but causing some balance issues.  The patient is already on gabapentin 600 mg 3 times daily for diabetes. She is due to see  Neurosurgery for "chronic pinched nerve at the neck", and she is already established with Dr. Franky Macho. Denies new paresthesias.   As for tremors, these are well controlled with Topiramate 150 am, 100 mg qhs. No recent changes on her meds. Denies dropping objects or excessive drooling . Denies visual distortions or  auditory hallucinations . Denies diplopia . No recent head trauma . No Urinary or Bowel Incontinence .No Difficulty Swallowing. No severe muscle cramps. Mood stable. Able to perform her ADLs . ContiNues to drive,   drive     ALLERGIES:   Allergies  Allergen Reactions   Promethazine Hcl Hives   Ativan [Lorazepam] Hives   Phenergan [Promethazine Hcl] Hives    CURRENT MEDICATIONS:  Outpatient Encounter Medications as of 01/27/2023  Medication Sig   albuterol (PROVENTIL) (2.5 MG/3ML) 0.083% nebulizer solution Take 3 mLs (2.5 mg total) by nebulization every 4 (four) hours as needed for  wheezing or shortness of breath.   albuterol (VENTOLIN HFA) 108 (90 Base) MCG/ACT inhaler INHALE 2 PUFFS INTO THE LUNGS EVERY 6 HOURS AS NEEDED FOR WHEEZING OR SHORTNESS OF BREATH   apixaban (ELIQUIS) 5 MG TABS tablet TAKE ONE TABLET BY MOUTH TWICE DAILY @ 9AM & 5PM   atorvastatin (LIPITOR) 20 MG tablet TAKE ONE TABLET BY MOUTH DAILY AT 5PM   BD PEN NEEDLE NANO 2ND GEN 32G X 4 MM MISC 1 each by Other route in the morning, at noon, in the evening, and at bedtime.   Benralizumab (FASENRA PEN) 30 MG/ML SOAJ Inject 1 mL (30 mg total) into the skin every 28 (twenty-eight) days. Then every 8 weeks   bisoprolol (ZEBETA) 10 MG tablet Take 1 tablet (10 mg total) by mouth daily. (Patient taking differently: Take 10 mg by mouth every morning.)   blood glucose meter kit and supplies 1 each by Other route 4 (four) times daily. Dispense based on patient and insurance preference. Use up to four times daily as directed. (FOR ICD-10 E10.9, E11.9).   BREO ELLIPTA 100-25 MCG/ACT AEPB INHALE 1 PUFF INTO LUNGS DAILY (BULK)   Cholecalciferol (VITAMIN D3) 125 MCG (5000 UT) CAPS Take 1 capsule (5,000 Units total) by mouth daily.   Continuous Blood Gluc Sensor (FREESTYLE LIBRE 3 SENSOR) MISC 1 Piece by Does not apply route every 14 (fourteen) days. Place 1 sensor on  the skin every 14 days. Use to check glucose continuously   CUVITRU 1 GM/5ML SOLN Inject into the skin.   CUVITRU 10 GM/50ML SOLN Inject 25 mg into the skin every 14 (fourteen) days.   CUVITRU 2 GM/10ML SOLN Inject into the skin.   CUVITRU 4 GM/20ML SOLN Inject into the skin.   diltiazem (CARDIZEM CD) 300 MG 24 hr capsule Take 1 capsule (300 mg total) by mouth daily. (Patient taking differently: Take 300 mg by mouth every morning.)   divalproex (DEPAKOTE ER) 500 MG 24 hr tablet Take 1 tablet (500 mg total) by mouth daily AND 2 tablets (1,000 mg total) at bedtime.   ELDERBERRY PO Take 4 g by mouth daily. Gummies 2g each   EPINEPHrine 0.3 mg/0.3 mL IJ SOAJ  injection Inject 0.3 mg into the muscle once as needed for anaphylaxis.   ferrous sulfate 324 MG TBEC Take 324 mg by mouth every evening.   gabapentin (NEURONTIN) 600 MG tablet Take 600 mg by mouth 3 (three) times daily.   hydrOXYzine (ATARAX) 25 MG tablet TAKE ONE TABLET BY MOUTH THREE TIMES DAILY AS NEEDED FOR ANXIETY OR FOR ITCHING (VIAL)   Immune Globulin, Human, (CUVITRU [place]) Inject 27 mg into the skin every 14 (fourteen) days.   insulin glargine (LANTUS SOLOSTAR) 100 UNIT/ML Solostar Pen Inject 40 Units into the skin at bedtime.   Insulin Pen Needle (PEN NEEDLES 3/16") 31G X 5 MM MISC Use as directed with insulin pen   levalbuterol (XOPENEX HFA) 45 MCG/ACT inhaler INHALE TWO PUFFS INTO LUNGS EVERY 6 HOURS AS NEEDED FOR WHEEZING (BULK)   levalbuterol (XOPENEX) 1.25 MG/3ML nebulizer solution Inhale 1.25 mg into the lungs daily as needed (asthma).   levothyroxine (SYNTHROID, LEVOTHROID) 112 MCG tablet Take 112 mcg by mouth daily before breakfast.   linaclotide (LINZESS) 145 MCG CAPS capsule TAKE ONE CAPSULE BY MOUTH DAILY BEFORE BREAKFAST   losartan (COZAAR) 50 MG tablet Take 50 mg by mouth every morning.   MELATONIN GUMMIES PO Take 10 mg by mouth at bedtime.   metFORMIN (GLUCOPHAGE) 500 MG tablet Take 500 mg by mouth 2 (two) times daily.   montelukast (SINGULAIR) 10 MG tablet Take 10 mg by mouth at bedtime.   Multiple Vitamins-Minerals (MULTIVITAMIN WITH MINERALS) tablet Take 1 tablet by mouth daily. Woman   Nebulizer MISC Nebulizer tubing kit   omeprazole (PRILOSEC) 20 MG capsule TAKE ONE CAPSULE BY MOUTH TWICE DAILY @ 9AM & 5PM BEOFRE MEALS   ondansetron (ZOFRAN-ODT) 8 MG disintegrating tablet Take 8 mg by mouth daily as needed for vomiting or nausea.   ONETOUCH ULTRA test strip 1 each by Other route in the morning, at noon, in the evening, and at bedtime.   polyethylene glycol (MIRALAX / GLYCOLAX) 17 g packet Take 17 g by mouth daily as needed. (Patient taking differently: Take 17 g by  mouth daily as needed for moderate constipation or severe constipation.)   Potassium Chloride ER 20 MEQ TBCR Take 20 mEq by mouth daily.   predniSONE (DELTASONE) 10 MG tablet Take two tablets ( ) twice daily for five days, then one tablet ( ) twice daily for five days, then one tablet ( ) daily for five days then STOP.   sertraline (ZOLOFT) 25 MG tablet Take 1 tablet (25 mg total) by mouth at bedtime.   Spacer/Aero-Holding Chambers (AEROCHAMBER PLUS) inhaler 1 each by Other route as needed for other. Use as instructed   topiramate (TOPAMAX) 50 MG tablet Take 1 tablet (50 mg total) by mouth  daily.   triamcinolone cream (KENALOG) 0.1 % Apply 1 application. topically daily as needed (foot).   TRULICITY 3 MG/0.5ML SOPN INJECT  AS DIRECTED ONCE A WEEK (BULk0   TURMERIC PO Take 1 tablet by mouth daily at 6 (six) AM.   vitamin C (ASCORBIC ACID) 500 MG tablet Take 500 mg by mouth daily. Power C immune support   Facility-Administered Encounter Medications as of 01/27/2023  Medication   Benralizumab SOSY 30 mg    Objective:   PHYSICAL EXAMINATION:    VITALS:   Vitals:   01/27/23 1038  Height:  (1.626 m)      GEN:  The patient appears stated age and is in NAD. HEENT:  Normocephalic, atraumatic.  The mucous membranes are moist. The superficial temporal arteries are without ropiness or tenderness. CV:  RRR Lungs:  CTAB Neck/HEME:  There are no carotid bruits bilaterally.  Neurological examination:  Orientation: The patient is alert and oriented x3. Cranial nerves: There is good facial symmetry with no facial hypomimia. The speech is fluent and clear. Soft palate rises symmetrically and there is no tongue deviation. Hearing is intact to conversational tone. Sensation: Sensation is intact bilateral to light touch throughout, temperature and vibration normal, DTR 1/4 at the feet, and 2 out of 4 on the hands. Motor: Strength is at least antigravity x4.  Movement  examination: Tone: There is normal tone in the extremities Abnormal movements:  Mild Postural tremor on the R, no tremor at rest. She is able to draw Archimedes spirals without significant difficulty,  Coordination:  There is no decremation with RAM's, good FNF.   Gait and Station: The patient has no difficulty arising out of a deep-seated chair without the use of the hands. The patient's stride length is normal.        Total time spent on today's visit was 24 minutes, including both face-to-face time and nonface-to-face time.  Time included that spent on review of records (prior notes available to me/labs/imaging if pertinent), discussing treatment and goals, answering patient's questions and coordinating care.

## 2023-01-27 NOTE — Patient Instructions (Signed)
Continue topamax as directed  Follow up in 1 year

## 2023-01-30 ENCOUNTER — Ambulatory Visit (INDEPENDENT_AMBULATORY_CARE_PROVIDER_SITE_OTHER): Payer: 59 | Admitting: Adult Health

## 2023-01-30 ENCOUNTER — Encounter: Payer: Self-pay | Admitting: Adult Health

## 2023-01-30 VITALS — BP 145/64 | HR 60 | Ht 64.0 in | Wt 223.0 lb

## 2023-01-30 DIAGNOSIS — G5781 Other specified mononeuropathies of right lower limb: Secondary | ICD-10-CM | POA: Diagnosis not present

## 2023-01-30 NOTE — Progress Notes (Signed)
  Subjective:     Patient ID: Laura [name], adult   DOB: January 06, 1961, 62 y.o.   MRN: [id number]  HPI Laura [name] is a 61 year old white female,widowed, PM in complaining of pain RLQ, that radiates into right hip and back, it started this weekend and makes her feel nausea at times. She had CT 01/04/23 that was negative.  Last pap was negative HPV, NILM 01/07/23.  PCP is Dr Margo Aye.  Review of Systems Pain in RLQ, radiates into right hip and back,has some nausea at times +pressure  Sometimes has to sit to get urine to flow No problems with BM Not having sex   Reviewed past medical,surgical, social and family history. Reviewed medications and allergies.  Objective:   Physical Exam BP (!) 145/64 (BP Location: Left Arm, Patient Position: Sitting, Cuff Size: Large)   Pulse 60   Ht  (1.626 m)   Wt 223 lb (101.2 kg)   BMI 38.28 kg/m     Skin warm and dry.Pelvic: external genitalia is normal in appearance no lesions, vagina: pale,urethra has no lesions or masses noted, cervix:smooth, uterus: normal size, shape and contour, non tender, no masses felt, adnexa: no masses, +tenderness noted, near C-section scar, has pain when head raised, Dr Despina Hidden in for co exam and he injected with 20 cc 0.5 % bupivacaine. Bladder is non tender and no masses felt.  Fall risk is low  Upstream - 01/30/23 1526       Pregnancy Intention Screening   Does the patient want to become pregnant in the next year? N/A    Does the patient's partner want to become pregnant in the next year? N/A    Would the patient like to discuss contraceptive options today? N/A      Contraception Wrap Up   Current Method Abstinence    End Method Abstinence    Contraception Counseling Provided No            Examination chaperoned by Malachy Mood LPN  Assessment:     1. Inflammation of right ilioinguinal nerve Injection trigger point with 0.5% bupivacaine 20 cc    Plan:     Follow up with Dr Despina Hidden about 02/17/23, may need another  injection if not better

## 2023-01-31 ENCOUNTER — Ambulatory Visit (INDEPENDENT_AMBULATORY_CARE_PROVIDER_SITE_OTHER): Payer: 59 | Admitting: Clinical

## 2023-01-31 DIAGNOSIS — F4310 Post-traumatic stress disorder, unspecified: Secondary | ICD-10-CM | POA: Diagnosis not present

## 2023-01-31 NOTE — Progress Notes (Signed)
Virtual Visit via Video Note   I connected with Laura [name] on 01/31/2023 at  9:00 AM EST by a video enabled telemedicine application and verified that I am speaking with the correct person using two identifiers.   Location: Patient: Home Provider: Office   I discussed the limitations of evaluation and management by telemedicine and the availability of in person appointments. The patient expressed understanding and agreed to proceed.   THERAPIST PROGRESS NOTE   Session Time: 9:00 AM-:9:45 AM   Participation Level: Active   Behavioral Response: CasualAlertDepressed   Type of Therapy: Individual Therapy   Treatment Goals addressed: Coping/ non-medication treatment therapy for PTSD.   Interventions: CBT, DBT, Solution Focused, Strength-based and Supportive   Summary: Laura [name] is a 62 y.o. female who presents with PTSD. The OPT therapist worked with the patient for her ongoing OPT treatment session. The OPT therapist utilized Motivational Interviewing to assist in creating therapeutic repore.The patient in the session was engaged and work in collaboration giving feedback about her triggers and symptoms over the past few weeks.The patient spoke about a at home fall and being seen for this and getting medical assistance from her PCP receiving shots in her stomach for pain. The OPT therapist utilized Cognitive Behavioral Therapy through cognitive restructuring as well as worked with the patient on coping strategies to assist in management of mental health symptoms. The patient verbalized, "  They told me just to take it easy after the fall and I am still a little sore , but I feel things are better but it was just yesterday afternoon". The OPT therapist worked with the patient on consistency in getting back to her health baseline and working to be consistent with her exercise classes. The patient noted she feels the medicine from [name] has been helping her mood, she has been active  outside more and planted a garden. The OPT therapist overviewed with the patient her basic health areas including sleep, eating, exercise, and hygiene. The patient noted that she is currently struggling with consistency with her eating habits. The patient noted that her 3 daughters are currently getting along well.The OPT therapist placed emphasis on the patient keeping all upcoming health appointments and adhering to ongoing health treatment recommendations. The OPT therapist reviewed all appointments listed coming up in the patients MyChart including upcoming appointment with [name] 02/08/2023. The patient noted she has booked her flight out to [place] to visit her daughter who lives in [place] for the beginning of June.   Suicidal/Homicidal: Nowithout intent/plan   Therapist Response:The OPT therapist worked with the patient for the patients scheduled session. The patient was engaged in her session and gave feedback in relation to triggers, symptoms, and behavior responses over the past few weeks. The patient spoke about recovering from a at home fall yesterday from which she was seen by her PCP and is still sore.The OPT therapist worked with the patient utilizing an in session Cognitive Behavioral Therapy exercise.  The patient spoke about being more active in getting outside the home more and planting a garden.The patient spoke about booking a plane ticket to go visit her daughter in [place] at the beginning of June.The patient spoke about looking forward to spending time with her family and grand children once she takes her flight out [place].  The patient noted she has been getting along with her daughter [name] a lot better who she historically has had a strained relationship.The patient has been working with her physical health  providers to manage physical health conditions. The patient spoke about her goal of getting back into the gym and being consistent in going back to the [facility]. The OPT  therapist worked with the patient overviewing upcoming med management appointment with [name] and upcoming appointments as listed in her MyChart.The OPT therapist will continue treatment work with the patient in her next scheduled session.   Plan: Return again in 2/3 weeks.   Diagnosis:      Axis I: Post Traumatic Stress Disorder                            Axis II: No diagnosis     Collaboration of Care: Overview with the patient of involvement in the Med Management program with [name].   Patient/Guardian was advised Release of Information must be obtained prior to any record release in order to collaborate their care with an outside provider. Patient/Guardian was advised if they have not already done so to contact the registration department to sign all necessary forms in order for [place] to release information regarding their care.    Consent: Patient/Guardian gives verbal consent for treatment and assignment of benefits for services provided during this visit. Patient/Guardian expressed understanding and agreed to proceed.      I discussed the assessment and treatment plan with the patient. The patient was provided an opportunity to ask questions and all were answered. The patient agreed with the plan and demonstrated an understanding of the instructions.   The patient was advised to call back or seek an in-person evaluation if the symptoms worsen or if the condition fails to improve as anticipated.   I provided 45 minutes of non-face-to-face time during this encounter.   Suzan Garibaldi, LCSW    01/31/2023

## 2023-02-02 DIAGNOSIS — H903 Sensorineural hearing loss, bilateral: Secondary | ICD-10-CM | POA: Diagnosis not present

## 2023-02-02 DIAGNOSIS — H6982 Other specified disorders of Eustachian tube, left ear: Secondary | ICD-10-CM | POA: Diagnosis not present

## 2023-02-03 DIAGNOSIS — D839 Common variable immunodeficiency, unspecified: Secondary | ICD-10-CM | POA: Diagnosis not present
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
## 2023-02-06 ENCOUNTER — Ambulatory Visit (HOSPITAL_COMMUNITY)
Admission: RE | Admit: 2023-02-06 | Discharge: 2023-02-06 | Disposition: A | Discharge status: Home / Self Care | Payer: 59 | Source: Ambulatory Visit | Attending: Internal Medicine | Admitting: Internal Medicine

## 2023-02-06 DIAGNOSIS — Z1231 Encounter for screening mammogram for malignant neoplasm of breast: Secondary | ICD-10-CM | POA: Diagnosis not present

## 2023-02-08 ENCOUNTER — Encounter: Payer: Self-pay | Admitting: Psychiatry

## 2023-02-08 ENCOUNTER — Telehealth (INDEPENDENT_AMBULATORY_CARE_PROVIDER_SITE_OTHER): Payer: 59 | Admitting: Psychiatry

## 2023-02-08 DIAGNOSIS — F3341 Major depressive disorder, recurrent, in partial remission: Secondary | ICD-10-CM | POA: Diagnosis not present

## 2023-02-08 DIAGNOSIS — F4310 Post-traumatic stress disorder, unspecified: Secondary | ICD-10-CM | POA: Diagnosis not present

## 2023-02-08 MED ORDER — SERTRALINE HCL 25 MG PO TABS: 25.0000 mg | ORAL_TABLET | Freq: Every day | ORAL | 0 refills | Status: DC

## 2023-02-09 ENCOUNTER — Telehealth: Payer: Self-pay | Admitting: Pulmonary Disease

## 2023-02-11 DIAGNOSIS — G4733 Obstructive sleep apnea (adult) (pediatric): Secondary | ICD-10-CM | POA: Diagnosis not present

## 2023-02-16 MED ORDER — FLUTICASONE FUROATE-VILANTEROL 100-25 MCG/ACT IN AEPB: INHALATION_SPRAY | RESPIRATORY_TRACT | 5 refills | Status: DC

## 2023-02-16 NOTE — Telephone Encounter (Signed)
Called and spoke with patient. Advised patient I would send the name brand RX in for her to the pharmacy. Advised patient if another issue pops up, for her to give [place] a call.   Nothing further needed.

## 2023-02-17 ENCOUNTER — Ambulatory Visit (INDEPENDENT_AMBULATORY_CARE_PROVIDER_SITE_OTHER): Payer: 59 | Admitting: Obstetrics & Gynecology

## 2023-02-17 ENCOUNTER — Encounter: Payer: Self-pay | Admitting: Obstetrics & Gynecology

## 2023-02-17 VITALS — BP 156/68 | Ht 64.0 in | Wt 220.0 lb

## 2023-02-17 DIAGNOSIS — D839 Common variable immunodeficiency, unspecified: Secondary | ICD-10-CM | POA: Diagnosis not present

## 2023-02-17 DIAGNOSIS — G5791 Unspecified mononeuropathy of right lower limb: Secondary | ICD-10-CM

## 2023-02-17 NOTE — Progress Notes (Signed)
Trigger Point Injection : 2nd course Right ilioinguinal nerve  Pre-operative diagnosis: myofascial pain/neurogenic  Post-operative diagnosis: myofascial pain/neurogenic  After risks and benefits were explained including bleeding, infection, worsening of the pain, damage to the area being injected, weakness, allergic reaction to medications, vascular injection, and nerve damage, signed consent was obtained.  All questions were answered.    The area of the trigger point was identified and the skin prepped three times with alcohol and the alcohol allowed to dry.  Next, a 25 gauge 1.5 inch needle was placed in the area of the trigger point.  Once reproduction of the pain was elicited and negative aspiration confirmed, the trigger point was injected and the needle removed.    The patient did tolerate the procedure well and there were not complications.    Medication used:  20 cc total 0.5% marcaine plain right of midline  Trigger points injected: 1    Trigger point(s) location(s):  right

## 2023-02-19 ENCOUNTER — Other Ambulatory Visit: Payer: Self-pay | Admitting: Family Medicine

## 2023-02-21 ENCOUNTER — Ambulatory Visit (INDEPENDENT_AMBULATORY_CARE_PROVIDER_SITE_OTHER): Payer: 59

## 2023-02-21 ENCOUNTER — Ambulatory Visit (INDEPENDENT_AMBULATORY_CARE_PROVIDER_SITE_OTHER): Payer: 59 | Admitting: Podiatry

## 2023-02-21 DIAGNOSIS — M2041 Other hammer toe(s) (acquired), right foot: Secondary | ICD-10-CM

## 2023-02-21 DIAGNOSIS — M2011 Hallux valgus (acquired), right foot: Secondary | ICD-10-CM

## 2023-02-21 DIAGNOSIS — M170 Bilateral primary osteoarthritis of knee: Secondary | ICD-10-CM

## 2023-02-21 NOTE — Progress Notes (Signed)
  Subjective:  Patient ID: Laura [name], adult    DOB: 1961/06/05,  MRN: [id number]  Chief Complaint  Patient presents with   Bunions    Right foot surgery follow up      62 y.o. adult returns for post-op check.  She is doing well she is back to most of her activities she is in regular shoe gear.  Still has occasional tenderness  Review of Systems: Negative except as noted in the HPI. Denies N/V/F/Ch.   Objective:  There were no vitals filed for this visit. There is no height or weight on file to calculate BMI. Constitutional Well developed. Well nourished.  Vascular Foot warm and well perfused. Capillary refill normal to all digits.  Calf is soft and supple, no posterior calf or knee pain, negative Homans' sign  Neurologic Normal speech. Oriented to person, place, and time. Epicritic sensation to light touch grossly present bilaterally.  Dermatologic Incisions are well-healed and not hypertrophic.  Some discoloration of skin from K wire position  Orthopedic: Good range of motion of both PBJ's, no tenderness to palpation.   Multiple view plain film radiographs: Good healing across osteotomy and PIPJ arthrodesis sites Assessment:   1. Hallux valgus (acquired), right foot   2. Hammer toe of right foot   3. Osteoarthritis of both knees, unspecified osteoarthritis type    Plan:  Patient was evaluated and treated and all questions answered.  S/p foot surgery right -Overall doing very well and can go back to full activity in regular shoes.  She has full healing of her osteotomy sites, the screws are in good position and I do not think will require any removal.  She will let me know if she has further issues with the foot.  Her orthopedist is no longer in network and she has been seen for bilateral knee arthritis, I placed a new referral to [facility] to be evaluated.  Return if symptoms worsen or fail to improve.

## 2023-02-27 ENCOUNTER — Ambulatory Visit (INDEPENDENT_AMBULATORY_CARE_PROVIDER_SITE_OTHER): Payer: 59 | Admitting: Clinical

## 2023-02-27 DIAGNOSIS — F4310 Post-traumatic stress disorder, unspecified: Secondary | ICD-10-CM

## 2023-02-27 NOTE — Progress Notes (Signed)
Virtual Visit via Video Note   I connected with Laura [name] on 02/27/2023 at  10:00 AM EST by a video enabled telemedicine application and verified that I am speaking with the correct person using two identifiers.   Location: Patient: Home Provider: Office   I discussed the limitations of evaluation and management by telemedicine and the availability of in person appointments. The patient expressed understanding and agreed to proceed.   THERAPIST PROGRESS NOTE   Session Time: 10:00 AM-:10:45 AM   Participation Level: Active   Behavioral Response: CasualAlertDepressed   Type of Therapy: Individual Therapy   Treatment Goals addressed: Coping/ non-medication treatment therapy for PTSD.   Interventions: CBT, DBT, Solution Focused, Strength-based and Supportive   Summary: Laura [name] is a 62 y.o. female who presents with PTSD. The OPT therapist worked with the patient for her ongoing OPT treatment session. The OPT therapist utilized Motivational Interviewing to assist in creating therapeutic repore.The patient in the session was engaged and work in collaboration giving feedback about her triggers and symptoms over the past few weeks.The patient spoke about preparing for her upcoming trip to [place].The patient spoke about all 3 of her girls having birthdays since the last session and celebrating with them noting she now has 3 girls all in there 30's. The OPT therapist utilized Cognitive Behavioral Therapy through cognitive restructuring as well as worked with the patient on coping strategies to assist in management of mental health symptoms.  The patient spoke about her daughter having doubts about the patient being her bio Mother, despite the patient being listed as the Mother on her birth certificate. The patient noted, " I don't know where she got this idea I had that baby I have scars from giving birth from that child". The OPT therapist worked with the patient on consistency in  getting back to her health baseline and working to be consistent with her exercise classes. The patient spoke about her last appointment with  Dr. Vanetta [name] since her last OPT visit. The OPT therapist overviewed with the patient her basic health areas including sleep, eating, exercise, and hygiene. The patient noted that she is currently struggling with consistency with her eating habits.The OPT therapist placed emphasis on the patient keeping all upcoming health appointments and adhering to ongoing health treatment recommendations. The OPT therapist reviewed all appointments listed coming up in the patients MyChart including upcoming appointment with Dr. Vanetta [name] 04/11/2023.    Suicidal/Homicidal: Nowithout intent/plan   Therapist Response:The OPT therapist worked with the patient for the patients scheduled session. The patient was engaged in her session and gave feedback in relation to triggers, symptoms, and behavior responses over the past few weeks. The patient spoke about recovering from a at home fall yesterday from which she was seen by her PCP and is still sore.The OPT therapist worked with the patient utilizing an in session Cognitive Behavioral Therapy exercise.The patient spoke about booking a plane ticket to go visit her daughter in [place] at the beginning of June.The patient spoke about looking forward to spending time with her family and grand children once she takes her flight out [place].  The patient noted she has been in conflict with her daughter Laura [name] a lot better who she historically has had a strained relationship with who recently has decided that the patient is not her bio-mother which has been a recent trigger for the patient.The patient has been working with her physical health providers to manage physical health conditions. The patient spoke about  getting  back into the gym and being consistent in going to the [facility] as well as continuing gardening. The OPT therapist worked with the patient  overviewing upcoming med management appointment with Dr. Vanetta [name] and upcoming appointments as listed in her MyChart.The OPT therapist will continue treatment work with the patient in her next scheduled session.   Plan: Return again in 2/3 weeks.   Diagnosis:      Axis I: Post Traumatic Stress Disorder                            Axis II: No diagnosis     Collaboration of Care: Overview with the patient of involvement in the Med Management program with Dr. Vanetta [name].   Patient/Guardian was advised Release of Information must be obtained prior to any record release in order to collaborate their care with an outside provider. Patient/Guardian was advised if they have not already done so to contact the registration department to sign all necessary forms in order for [place] to release information regarding their care.    Consent: Patient/Guardian gives verbal consent for treatment and assignment of benefits for services provided during this visit. Patient/Guardian expressed understanding and agreed to proceed.      I discussed the assessment and treatment plan with the patient. The patient was provided an opportunity to ask questions and all were answered. The patient agreed with the plan and demonstrated an understanding of the instructions.   The patient was advised to call back or seek an in-person evaluation if the symptoms worsen or if the condition fails to improve as anticipated.   I provided 45 minutes of non-face-to-face time during this encounter.   Suzan Garibaldi, LCSW    02/27/2023

## 2023-03-02 ENCOUNTER — Other Ambulatory Visit: Payer: Self-pay | Admitting: Gastroenterology

## 2023-03-03 ENCOUNTER — Ambulatory Visit (INDEPENDENT_AMBULATORY_CARE_PROVIDER_SITE_OTHER): Payer: 59

## 2023-03-03 ENCOUNTER — Encounter: Payer: Self-pay | Admitting: Obstetrics & Gynecology

## 2023-03-03 ENCOUNTER — Ambulatory Visit (INDEPENDENT_AMBULATORY_CARE_PROVIDER_SITE_OTHER): Payer: 59 | Admitting: Obstetrics & Gynecology

## 2023-03-03 VITALS — BP 155/72 | HR 69 | Ht 64.0 in | Wt 219.0 lb

## 2023-03-03 DIAGNOSIS — I495 Sick sinus syndrome: Secondary | ICD-10-CM

## 2023-03-03 DIAGNOSIS — E559 Vitamin D deficiency, unspecified: Secondary | ICD-10-CM | POA: Diagnosis not present

## 2023-03-03 DIAGNOSIS — G5791 Unspecified mononeuropathy of right lower limb: Secondary | ICD-10-CM

## 2023-03-03 DIAGNOSIS — E039 Hypothyroidism, unspecified: Secondary | ICD-10-CM | POA: Diagnosis not present

## 2023-03-03 DIAGNOSIS — D839 Common variable immunodeficiency, unspecified: Secondary | ICD-10-CM | POA: Diagnosis not present

## 2023-03-03 DIAGNOSIS — E1165 Type 2 diabetes mellitus with hyperglycemia: Secondary | ICD-10-CM | POA: Diagnosis not present

## 2023-03-03 LAB — CUP PACEART REMOTE DEVICE CHECK
Battery Remaining Longevity: 85 mo
Battery Remaining Percentage: 93 %
Battery Voltage: 3.01 V
Brady Statistic AP VP Percent: 32 %
Brady Statistic AP VS Percent: 1 %
Brady Statistic AS VP Percent: 9.6 %
Brady Statistic AS VS Percent: 57 %
Brady Statistic RA Percent Paced: 32 %
Brady Statistic RV Percent Paced: 41 %
Date Time Interrogation Session: 20240524040013
Implantable Lead Connection Status: 753985
Implantable Lead Connection Status: 753985
Implantable Lead Implant Date: 20230525
Implantable Lead Implant Date: 20230525
Implantable Lead Location: 753859
Implantable Lead Location: 753860
Implantable Pulse Generator Implant Date: 20230525
Lead Channel Impedance Value: 430 Ohm
Lead Channel Impedance Value: 430 Ohm
Lead Channel Pacing Threshold Amplitude: 0.75 V
Lead Channel Pacing Threshold Pulse Width: 0.5 ms
Lead Channel Sensing Intrinsic Amplitude: 1.6 mV
Lead Channel Sensing Intrinsic Amplitude: 10.4 mV
Lead Channel Setting Pacing Amplitude: 1 V
Lead Channel Setting Pacing Amplitude: 3.5 V
Lead Channel Setting Pacing Pulse Width: 0.5 ms
Lead Channel Setting Sensing Sensitivity: 0.5 mV
Pulse Gen Model: 2272
Pulse Gen Serial Number: 8084453

## 2023-03-03 MED ORDER — LIDOCAINE 5 % EX PTCH: 1.0000 | MEDICATED_PATCH | CUTANEOUS | 11 refills | Status: DC

## 2023-03-03 NOTE — Progress Notes (Signed)
Follow up appointment for results: Right ilioinguinal nerve pain  Chief Complaint  Patient presents with   Follow-up    Blood pressure (!) 155/72, pulse 69, height 5\' 4"  (1.626 m), weight 219 lb (99.3 kg).  Pt states the pain has signficantly subsided although waxes and wanes a bit She doesn't think she necessarily needs re injection today  Recommended OTC capsacin at night and Rx lidoderm patches cut to size  [employer] is in her court for re injection or questions  MEDS ordered this encounter: Meds ordered this encounter  Medications   lidocaine (LIDODERM) 5 %    Sig: Place 1 patch onto the skin daily. Remove & Discard patch within 12 hours or as directed by MD    Dispense:  30 patch    Refill:  11    Orders for this encounter: No orders of the defined types were placed in this encounter.   Impression + Management Plan   ICD-10-CM   1. Ilioinguinal neuralgia of right side  G57.91       Follow Up: Return if symptoms worsen or fail to improve.     All questions were answered.  Past Medical History:  Diagnosis Date   Anemia    Aortic atherosclerosis (HCC)    Asthma    Atrial fibrillation and flutter (HCC)    a.) CHA2DS2VASc = 6 (sex, HTN, TIA x2, vascular disease history, T2DM);  b.) DCCV ~ 2009; c.) DCCV 08/29/2018 (200J x1 --> SR with 1st degree AVB); d.) rate/rhythm maintained on oral diltiazem + bisoprolol; chronically anticoagulated with apixaban   Bipolar disorder (HCC)    Carpal tunnel syndrome of right wrist    CHB (complete heart block) (HCC)    a.) s/p St. Jude dual chamber PPM placement   CHF (congestive heart failure) (HCC)    Complication of anesthesia    a.) "allergic reaction" to perioperative analgesics + anesthetic medications in [place]; unsure of combination; not able to recount reactions/events --> states "I ended up in the ICU for 5 days"   Constipation    a.) on linaclotide   DDD (degenerative disc disease), thoracic    GERD  (gastroesophageal reflux disease)    H/O congenital atrial septal defect (ASD) repair    Hallux valgus of right foot    Hammertoe of second toe of right foot    History of 2019 novel coronavirus disease (COVID-19)    a.) 10/2020; b.) 05/2021   HLD (hyperlipidemia)    HTN (hypertension)    Hypogammaglobulinemia (HCC)    a.) on Curitru   Hypothyroid    IgE deficiency (HCC)    Long term current use of anticoagulant    a.) apixaban   Lumbar stenosis    Migraines    Mitral stenosis 02/08/2022   a.) TTE 02/08/2022: EF 60-65%, mild MAC, mild MV stenosis (peak grad 14.6 / mean grad 4.0)   Neuropathy    NSVT (nonsustained ventricular tachycardia) (HCC) 10/31/2018   a.) Holter 10/31/2018: 6 beat run   Obesity    OSA on CPAP    Osteoarthritis    Presence of permanent cardiac 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 Jude dual chamber device; placed for symptomatic bradycardia secondary to intermittent CHB   PTSD (post-traumatic stress disorder)    Pulmonary nodules    Recurrent sinusitis    Short-term memory loss    Sleep difficulties    a.) takes melatonin   Symptomatic bradycardia    a.) s/p St. Jude dual chamber PPM placement  Syncope    TIA (transient ischemic attack)    x3-last one in 2015   Tremors of nervous system    Type 2 diabetes mellitus [facility])     Past Surgical History:  Procedure Laterality Date   ASD REPAIR  1968   BIOPSY  01/26/2021   Procedure: BIOPSY;  Surgeon: Lanelle Bal, DO;  [facility];  Service: Endoscopy;;  gastric   CARDIOVERSION  2009   CARDIOVERSION N/A 08/29/2018   Procedure: CARDIOVERSION;  Surgeon: Antoine Poche, MD;  Location: [facility];  Service: Endoscopy;  Laterality: N/A;   CARPAL TUNNEL RELEASE Bilateral    CESAREAN SECTION  1986, 1988, 1994   COLONOSCOPY WITH PROPOFOL N/A 01/26/2021   Procedure: COLONOSCOPY WITH PROPOFOL;  Surgeon: Lanelle Bal, DO;  [facility]: [facility];  Service: Endoscopy;  Laterality: N/A;  am  appt, diabetic   ESOPHAGOGASTRODUODENOSCOPY (EGD) WITH PROPOFOL N/A 01/26/2021   Procedure: ESOPHAGOGASTRODUODENOSCOPY (EGD) WITH PROPOFOL;  Surgeon: Lanelle Bal, DO;  [facility]: [facility];  Service: Endoscopy;  Laterality: N/A;   HALLUX VALGUS [place]IN Right 07/29/2022   Procedure: HALLUX VALGUS;  Surgeon: Edwin Cap, DPM;  Location: [facility];  Service: Podiatry;  Laterality: Right;   HAMMER TOE SURGERY Right 07/29/2022   Procedure: HAMMER TOE CORRECTION 2ND;  Surgeon: Edwin Cap, DPM;  Location: [facility];  Service: Podiatry;  Laterality: Right;   LEFT HEART CATH AND CORONARY ANGIOGRAPHY N/A 08/30/2019   Procedure: LEFT HEART CATH AND CORONARY ANGIOGRAPHY;  Surgeon: Corky Crafts, MD;  Location: [facility] INVASIVE CV LAB;  Service: Cardiovascular;  Laterality: N/A;   NASAL SINUS SURGERY  2017   PACEMAKER IMPLANT N/A 03/03/2022   Procedure: PACEMAKER IMPLANT;  Surgeon: Marinus Maw, MD;  Location: [facility] INVASIVE CV LAB;  Service: Cardiovascular;  Laterality: N/A;   POLYPECTOMY  01/26/2021   Procedure: POLYPECTOMY;  Surgeon: Lanelle Bal, DO;  Location: [facility];  Service: Endoscopy;;  colon    OB History     Gravida  6   Para  3   Term      Preterm  3   AB  3   Living  3      SAB  3   IAB      Ectopic      Multiple      Live Births              Allergies  Allergen Reactions   Promethazine Hcl Hives   Ativan [Lorazepam] Hives   Phenergan [Promethazine Hcl] Hives    Social History   Socioeconomic History   Marital status: Widowed    Spouse name: Not on file   Number of children: 3   Years of education: 10   Highest education level: Not on file  Occupational History   Not on file  Tobacco Use   Smoking status: Former    Packs/day: 1.00    Years: 28.00    Additional pack years: 0.00    Total pack years: 28.00    Types: Cigarettes    Start date: 3    Quit date: 2008    Years since quitting: 16.4   Smokeless tobacco:  Never  Vaping Use   Vaping Use: Never used  Substance and Sexual Activity   Alcohol use: Not Currently   Drug use: Not Currently   Sexual activity: Not Currently    Birth control/protection: Post-menopausal  Other Topics Concern   Not on file  Social History Narrative   Right handed   Drinks caffeine   One story home   Social Determinants of Health   Financial Resource Strain: High Risk (01/06/2022)   Overall Financial Resource Strain (CARDIA)    Difficulty of Paying Living Expenses: Hard  Food Insecurity: No Food Insecurity (01/06/2022)   Hunger Vital Sign    Worried About Running Out of Food in the Last Year: Never true    Ran Out of Food in the Last Year: Never true  Transportation Needs: No Transportation Needs (01/06/2022)   PRAPARE - Administrator, Civil Service (Medical): No    Lack of Transportation (Non-Medical): No  Physical Activity: Insufficiently Active (01/06/2022)   Exercise Vital Sign    Days of Exercise per Week: 4 days    Minutes of Exercise per Session: 30 min  Stress: No Stress Concern Present (01/06/2022)   [employer] of Occupational Health - Occupational Stress Questionnaire    Feeling of Stress : Only a little  Social Connections: Moderately Integrated (01/06/2022)   Social Connection and Isolation Panel [NHANES]    Frequency of Communication with Friends and Family: More than three times a week    Frequency of Social Gatherings with Friends and Family: More than three times a week    Attends Religious Services: More than 4 times per year    Active Member of [employer] or Organizations: Yes    Attends Banker Meetings: More than 4 times per year    Marital Status: Widowed    Family History  Problem Relation Age of Onset   Hypertension Mother    Stroke Mother    Kidney disease Mother    Heart attack Mother    Alcohol abuse Mother    Other Father        ?colon cancer   Kidney disease Sister    Hypertension Sister     Bipolar disorder Sister    Atrial fibrillation Sister    Heart disease Brother    Prostate cancer Brother    Thyroid disease Daughter    Hashimoto's thyroiditis Daughter    Celiac disease Daughter    Allergic rhinitis Daughter    Stroke Maternal Aunt    Heart disease Maternal Aunt    Asthma Neg Hx

## 2023-03-04 LAB — HEPATIC FUNCTION PANEL
ALT: 10 [IU]/L (ref 0–32)
AST: 11 [IU]/L (ref 0–40)
Albumin: 4.5 g/dL (ref 3.9–4.9)
Alkaline Phosphatase: 83 [IU]/L (ref 44–121)
Bilirubin Total: 0.2 mg/dL (ref 0.0–1.2)
Bilirubin, Direct: <0.1 mg/dL (ref 0.00–0.40)
Total Protein: 7.1 g/dL (ref 6.0–8.5)

## 2023-03-04 LAB — COMPREHENSIVE METABOLIC PANEL
ALT: 10 IU/L (ref 0–32)
AST: 9 IU/L (ref 0–40)
Albumin/Globulin Ratio: 1.5 (ref 1.2–2.2)
Albumin: 4.3 g/dL (ref 3.9–4.9)
Alkaline Phosphatase: 85 IU/L (ref 44–121)
BUN/Creatinine Ratio: 22 (ref 12–28)
BUN: 15 mg/dL (ref 8–27)
Bilirubin Total: 0.2 mg/dL (ref 0.0–1.2)
CO2: 21 mmol/L (ref 20–29)
Calcium: 9.2 mg/dL (ref 8.7–10.3)
Chloride: 107 mmol/L — ABNORMAL HIGH (ref 96–106)
Creatinine, Ser: 0.69 mg/dL (ref 0.57–1.00)
Globulin, Total: 2.9 g/dL (ref 1.5–4.5)
Glucose: 179 mg/dL — ABNORMAL HIGH (ref 70–99)
Potassium: 4.6 mmol/L (ref 3.5–5.2)
Sodium: 141 mmol/L (ref 134–144)
Total Protein: 7.2 g/dL (ref 6.0–8.5)
eGFR: 99 mL/min/{1.73_m2} (ref >59)

## 2023-03-04 LAB — TSH: TSH: 1.18 u[IU]/mL (ref 0.450–4.500)

## 2023-03-04 LAB — LIPID PANEL
Chol/HDL Ratio: 3.6 ratio (ref 0.0–4.4)
Cholesterol, Total: 157 mg/dL (ref 100–199)
HDL: 44 mg/dL (ref >39)
LDL Chol Calc (NIH): 92 mg/dL (ref 0–99)
Triglycerides: 115 mg/dL (ref 0–149)
VLDL Cholesterol Cal: 21 mg/dL (ref 5–40)

## 2023-03-04 LAB — VITAMIN D 25 HYDROXY (VIT D DEFICIENCY, FRACTURES): Vit D, 25-Hydroxy: 56.3 ng/mL (ref 30.0–100.0)

## 2023-03-04 LAB — VITAMIN B12: Vitamin B-12: 1079 pg/mL (ref 232–1245)

## 2023-03-04 LAB — CBC
Hematocrit: 44.6 % (ref 34.0–46.6)
Hemoglobin: 14.9 g/dL (ref 11.1–15.9)
MCH: 33 pg (ref 26.6–33.0)
MCHC: 33.4 g/dL (ref 31.5–35.7)
MCV: 99 fL — ABNORMAL HIGH (ref 79–97)
Platelets: 146 10*3/uL — ABNORMAL LOW (ref 150–450)
RBC: 4.52 x10E6/uL (ref 3.77–5.28)
RDW: 13.1 % (ref 11.7–15.4)
WBC: 4.6 10*3/uL (ref 3.4–10.8)

## 2023-03-04 LAB — VALPROIC ACID LEVEL: Valproic Acid Lvl: 68 ug/mL (ref 50–100)

## 2023-03-04 LAB — T4, FREE: Free T4: 1.3 ng/dL (ref 0.82–1.77)

## 2023-03-06 ENCOUNTER — Encounter: Payer: Self-pay | Admitting: Psychiatry

## 2023-03-08 ENCOUNTER — Encounter: Payer: Self-pay | Admitting: "Endocrinology

## 2023-03-08 ENCOUNTER — Ambulatory Visit (INDEPENDENT_AMBULATORY_CARE_PROVIDER_SITE_OTHER): Payer: 59 | Admitting: "Endocrinology

## 2023-03-08 VITALS — BP 130/72 | HR 72 | Ht 64.0 in | Wt 217.8 lb

## 2023-03-08 DIAGNOSIS — E039 Hypothyroidism, unspecified: Secondary | ICD-10-CM | POA: Diagnosis not present

## 2023-03-08 DIAGNOSIS — E782 Mixed hyperlipidemia: Secondary | ICD-10-CM | POA: Diagnosis not present

## 2023-03-08 DIAGNOSIS — E559 Vitamin D deficiency, unspecified: Secondary | ICD-10-CM

## 2023-03-08 DIAGNOSIS — Z794 Long term (current) use of insulin: Secondary | ICD-10-CM | POA: Insufficient documentation

## 2023-03-08 DIAGNOSIS — E1165 Type 2 diabetes mellitus with hyperglycemia: Secondary | ICD-10-CM | POA: Diagnosis not present

## 2023-03-08 LAB — POCT GLYCOSYLATED HEMOGLOBIN (HGB A1C): HbA1c, POC (controlled diabetic range): 7.8 % — AB (ref 0.0–7.0)

## 2023-03-08 MED ORDER — TRULICITY 4.5 MG/0.5ML ~~LOC~~ SOAJ: 4.5000 mg | SUBCUTANEOUS | 1 refills | Status: DC

## 2023-03-08 MED ORDER — LANTUS SOLOSTAR 100 UNIT/ML ~~LOC~~ SOPN: 50.0000 [IU] | PEN_INJECTOR | Freq: Every day | SUBCUTANEOUS | 1 refills | Status: DC

## 2023-03-08 NOTE — Progress Notes (Signed)
03/08/2023, 10:33 AM   Endocrinology follow-up note   Subjective:    Patient ID: Laura [name], adult    DOB: 06-01-1961.  Laura [name] was seen since 2019 for management of diabetes, also seen during her last visit in relation to her steroid exposure to rule out adrenal insufficiency.  PMD: Benita Stabile, MD.   Past Medical History:  Diagnosis Date   Anemia    Aortic atherosclerosis (HCC)    Asthma    Atrial fibrillation and flutter (HCC)    a.) CHA2DS2VASc = 6 (sex, HTN, TIA x2, vascular disease history, T2DM);  b.) DCCV ~ 2009; c.) DCCV 08/29/2018 (200J x1 --> SR with 1st degree AVB); d.) rate/rhythm maintained on oral diltiazem + bisoprolol; chronically anticoagulated with apixaban   Bipolar disorder (HCC)    Carpal tunnel syndrome of right wrist    CHB (complete heart block) (HCC)    a.) s/p St. Jude dual chamber PPM placement   CHF (congestive heart failure) (HCC)    Complication of anesthesia    a.) "allergic reaction" to perioperative analgesics + anesthetic medications in [place]; unsure of combination; not able to recount reactions/events --> states "I ended up in the ICU for 5 days"   Constipation    a.) on linaclotide   DDD (degenerative disc disease), thoracic    GERD (gastroesophageal reflux disease)    H/O congenital atrial septal defect (ASD) repair    Hallux valgus of right foot    Hammertoe of second toe of right foot    History of 2019 novel coronavirus disease (COVID-19)    a.) 10/2020; b.) 05/2021   HLD (hyperlipidemia)    HTN (hypertension)    Hypogammaglobulinemia (HCC)    a.) on Curitru   Hypothyroid    IgE deficiency (HCC)    Long term current use of anticoagulant    a.) apixaban   Lumbar stenosis    Migraines    Mitral stenosis 02/08/2022   a.) TTE 02/08/2022: EF 60-65%, mild MAC, mild MV stenosis (peak grad 14.6 / mean grad 4.0)   Neuropathy    NSVT  (nonsustained ventricular tachycardia) (HCC) 10/31/2018   a.) Holter 10/31/2018: 6 beat run   Obesity    OSA on CPAP    Osteoarthritis    Presence of permanent cardiac 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 Jude dual chamber device; placed for symptomatic bradycardia secondary to intermittent CHB   PTSD (post-traumatic stress disorder)    Pulmonary nodules    Recurrent sinusitis    Short-term memory loss    Sleep difficulties    a.) takes melatonin   Symptomatic bradycardia    a.) s/p St. Jude dual chamber PPM placement   Syncope    TIA (transient ischemic attack)    x3-last one in 2015   Tremors of nervous system    Type 2 diabetes mellitus (HCC)    Past Surgical History:  Procedure Laterality Date   ASD REPAIR  1968   BIOPSY  01/26/2021   Procedure: BIOPSY;  Surgeon: Lanelle Bal, DO;  [facility];  Service: Endoscopy;;  gastric   CARDIOVERSION  2009   CARDIOVERSION N/A  08/29/2018   Procedure: CARDIOVERSION;  Surgeon: Antoine Poche, MD;  [facility]: [facility];  Service: Endoscopy;  Laterality: N/A;   CARPAL TUNNEL RELEASE Bilateral    CESAREAN SECTION  1986, 1988, 1994   COLONOSCOPY WITH PROPOFOL N/A 01/26/2021   Procedure: COLONOSCOPY WITH PROPOFOL;  Surgeon: Lanelle Bal, DO;  [facility]: [facility];  Service: Endoscopy;  Laterality: N/A;  am appt, diabetic   ESOPHAGOGASTRODUODENOSCOPY (EGD) WITH PROPOFOL N/A 01/26/2021   Procedure: ESOPHAGOGASTRODUODENOSCOPY (EGD) WITH PROPOFOL;  Surgeon: Lanelle Bal, DO;  [facility];  Service: Endoscopy;  Laterality: N/A;   HALLUX VALGUS AUSTIN Right 07/29/2022   Procedure: HALLUX VALGUS;  Surgeon: Edwin Cap, DPM;  Location: [facility];  Service: Podiatry;  Laterality: Right;   HAMMER TOE SURGERY Right 07/29/2022   Procedure: HAMMER TOE CORRECTION 2ND;  Surgeon: Edwin Cap, DPM;  Location: [facility];  Service: Podiatry;  Laterality: Right;   LEFT HEART CATH AND CORONARY ANGIOGRAPHY N/A  08/30/2019   Procedure: LEFT HEART CATH AND CORONARY ANGIOGRAPHY;  Surgeon: Corky Crafts, MD;  Location: [facility] INVASIVE CV LAB;  Service: Cardiovascular;  Laterality: N/A;   NASAL SINUS SURGERY  2017   PACEMAKER IMPLANT N/A 03/03/2022   Procedure: PACEMAKER IMPLANT;  Surgeon: Marinus Maw, MD;  Location: [facility] CV [facility];  Service: Cardiovascular;  Laterality: N/A;   POLYPECTOMY  01/26/2021   Procedure: POLYPECTOMY;  Surgeon: Lanelle Bal, DO;  [facility];  Service: Endoscopy;;  colon   Social History   Socioeconomic History   Marital status: Widowed    Spouse name: Not on file   Number of children: 3   Years of education: 10   Highest education level: Not on file  Occupational History   Not on file  Tobacco Use   Smoking status: Former    Packs/day: 1.00    Years: 28.00    Additional pack years: 0.00    Total pack years: 28.00    Types: Cigarettes    Start date: 66    Quit date: 2008    Years since quitting: 16.4   Smokeless tobacco: Never  Vaping Use   Vaping Use: Never used  Substance and Sexual Activity   Alcohol use: Not Currently   Drug use: Not Currently   Sexual activity: Not Currently    Birth control/protection: Post-menopausal  Other Topics Concern   Not on file  Social History Narrative   Right handed   Drinks caffeine   One story home   Social Determinants of Health   Financial Resource Strain: High Risk (01/06/2022)   Overall Financial Resource Strain (CARDIA)    Difficulty of Paying Living Expenses: Hard  Food Insecurity: No Food Insecurity (01/06/2022)   Hunger Vital Sign    Worried About Running Out of Food in the Last Year: Never true    Ran Out of Food in the Last Year: Never true  Transportation Needs: No Transportation Needs (01/06/2022)   PRAPARE - Administrator, Civil Service (Medical): No    Lack of Transportation (Non-Medical): No  Physical Activity: Insufficiently Active (01/06/2022)   Exercise  Vital Sign    Days of Exercise per Week: 4 days    Minutes of Exercise per Session: 30 min  Stress: No Stress Concern Present (01/06/2022)   [employer] of Occupational Health - Occupational Stress Questionnaire    Feeling of Stress : Only a little  Social Connections: Moderately Integrated (01/06/2022)  Social Advertising account executive [NHANES]    Frequency of Communication with Friends and Family: More than three times a week    Frequency of Social Gatherings with Friends and Family: More than three times a week    Attends Religious Services: More than 4 times per year    Active Member of [employer] or Organizations: Yes    Attends Banker Meetings: More than 4 times per year    Marital Status: Widowed   Outpatient Encounter Medications as of 03/08/2023  Medication Sig   Dulaglutide (TRULICITY) 4.5 MG/0.5ML SOPN Inject 4.5 mg as directed once a week.   albuterol (PROVENTIL) (2.5 MG/3ML) 0.083% nebulizer solution Take 3 mLs (2.5 mg total) by nebulization every 4 (four) hours as needed for wheezing or shortness of breath.   albuterol (VENTOLIN HFA) 108 (90 Base) MCG/ACT inhaler INHALE 2 PUFFS INTO THE LUNGS EVERY 6 HOURS AS NEEDED FOR WHEEZING OR SHORTNESS OF BREATH   apixaban (ELIQUIS) 5 MG TABS tablet TAKE ONE TABLET BY MOUTH TWICE DAILY @ 9AM & 5PM   atorvastatin (LIPITOR) 20 MG tablet TAKE ONE TABLET BY MOUTH DAILY AT 5PM   BD PEN NEEDLE NANO 2ND GEN 32G X 4 MM MISC 1 each by Other route in the morning, at noon, in the evening, and at bedtime.   bisoprolol (ZEBETA) 10 MG tablet Take 1 tablet (10 mg total) by mouth daily. (Patient taking differently: Take 10 mg by mouth every morning.)   blood glucose meter kit and supplies 1 each by Other route 4 (four) times daily. Dispense based on patient and insurance preference. Use up to four times daily as directed. (FOR ICD-10 E10.9, E11.9).   Cholecalciferol (VITAMIN D3) 125 MCG (5000 UT) CAPS Take 1 capsule (5,000 Units total)  by mouth daily.   Continuous Blood Gluc Sensor (FREESTYLE LIBRE 3 SENSOR) MISC 1 Piece by Does not apply route every 14 (fourteen) days. Place 1 sensor on the skin every 14 days. Use to check glucose continuously   CUVITRU 1 GM/5ML SOLN Inject into the skin.   CUVITRU 10 GM/50ML SOLN Inject 25 mg into the skin every 14 (fourteen) days.   CUVITRU 2 GM/10ML SOLN Inject into the skin.   CUVITRU 4 GM/20ML SOLN Inject into the skin. (Patient not taking: Reported on 02/17/2023)   diltiazem (CARDIZEM CD) 300 MG 24 hr capsule Take 1 capsule (300 mg total) by mouth daily. (Patient taking differently: Take 300 mg by mouth every morning.)   divalproex (DEPAKOTE ER) 500 MG 24 hr tablet Take 1 tablet (500 mg total) by mouth daily AND 2 tablets (1,000 mg total) at bedtime.   ELDERBERRY PO Take 4 g by mouth daily. Gummies 2g each   EPINEPHrine 0.3 mg/0.3 mL IJ SOAJ injection Inject 0.3 mg into the muscle once as needed for anaphylaxis. (Patient not taking: Reported on 02/17/2023)   FASENRA PEN 30 MG/ML SOAJ INJECT 30MG  SUBCUTANEOUSLY EVERY 8 WEEKS   ferrous sulfate 324 MG TBEC Take 324 mg by mouth every evening.   fluticasone furoate-vilanterol (BREO ELLIPTA) 100-25 MCG/ACT AEPB INHALE 1 PUFF INTO LUNGS DAILY (BULK)   gabapentin (NEURONTIN) 600 MG tablet Take 600 mg by mouth 3 (three) times daily.   hydrOXYzine (ATARAX) 25 MG tablet TAKE ONE TABLET BY MOUTH THREE TIMES DAILY AS NEEDED FOR ANXIETY OR FOR ITCHING (VIAL)   Immune Globulin, Human, (CUVITRU [place]) Inject 27 mg into the skin every 14 (fourteen) days.   insulin glargine (LANTUS SOLOSTAR) 100 UNIT/ML Solostar Pen  Inject 50 Units into the skin at bedtime.   Insulin Pen Needle (PEN NEEDLES 3/16") 31G X 5 MM MISC Use as directed with insulin pen   levalbuterol (XOPENEX HFA) 45 MCG/ACT inhaler INHALE TWO PUFFS INTO LUNGS EVERY 6 HOURS AS NEEDED FOR WHEEZING (BULK)   levalbuterol (XOPENEX) 1.25 MG/3ML nebulizer solution Inhale 1.25 mg into the lungs daily as  needed (asthma).   levothyroxine (SYNTHROID, LEVOTHROID) 112 MCG tablet Take 112 mcg by mouth daily before breakfast.   lidocaine (LIDODERM) 5 % Place 1 patch onto the skin daily. Remove & Discard patch within 12 hours or as directed by MD   linaclotide (LINZESS) 145 MCG CAPS capsule Take 1 capsule (145 mcg total) by mouth daily before breakfast.   losartan (COZAAR) 50 MG tablet Take 50 mg by mouth every morning.   MELATONIN GUMMIES PO Take 10 mg by mouth at bedtime.   metFORMIN (GLUCOPHAGE) 500 MG tablet Take 500 mg by mouth 2 (two) times daily.   montelukast (SINGULAIR) 10 MG tablet Take 10 mg by mouth at bedtime.   Multiple Vitamins-Minerals (MULTIVITAMIN WITH MINERALS) tablet Take 1 tablet by mouth daily. Woman   Nebulizer MISC Nebulizer tubing kit   omeprazole (PRILOSEC) 20 MG capsule TAKE ONE CAPSULE BY MOUTH TWICE DAILY @ 9AM & 5PM BEOFRE MEALS   ondansetron (ZOFRAN-ODT) 8 MG disintegrating tablet Take 8 mg by mouth daily as needed for vomiting or nausea.   ONETOUCH ULTRA test strip 1 each by Other route in the morning, at noon, in the evening, and at bedtime.   polyethylene glycol (MIRALAX / GLYCOLAX) 17 g packet Take 17 g by mouth daily as needed. (Patient taking differently: Take 17 g by mouth daily as needed for moderate constipation or severe constipation.)   Potassium Chloride ER 20 MEQ TBCR Take 20 mEq by mouth daily.   [START ON 03/29/2023] sertraline (ZOLOFT) 25 MG tablet Take 1 tablet (25 mg total) by mouth at bedtime.   Spacer/Aero-Holding Chambers (AEROCHAMBER PLUS) inhaler 1 each by Other route as needed for other. Use as instructed   topiramate (TOPAMAX) 50 MG tablet Take 1 tablet (50 mg total) by mouth daily.   triamcinolone cream (KENALOG) 0.1 % Apply 1 application. topically daily as needed (foot).   TURMERIC PO Take 1 tablet by mouth daily at 6 (six) AM.   vitamin C (ASCORBIC ACID) 500 MG tablet Take 500 mg by mouth daily. Power C immune support   [DISCONTINUED] insulin  glargine (LANTUS SOLOSTAR) 100 UNIT/ML Solostar Pen Inject 40 Units into the skin at bedtime.   [DISCONTINUED] TRULICITY 3 MG/0.5ML SOPN INJECT 3MG  AS DIRECTED ONCE A WEEK (BULk0   Facility-Administered Encounter Medications as of 03/08/2023  Medication   Benralizumab SOSY 30 mg    ALLERGIES: Allergies  Allergen Reactions   Promethazine Hcl Hives   Ativan [Lorazepam] Hives   Phenergan [Promethazine Hcl] Hives    VACCINATION STATUS: Immunization History  Administered Date(s) Administered   Influenza Split 08/24/2018   Influenza Whole 07/15/2019   Influenza,inj,Quad PF,6+ Mos 07/10/2017, 10/27/2021   Moderna Sars-Covid-2 Vaccination 12/31/2019, 01/30/2020   Pneumococcal-Unspecified 10/10/2012    Diabetes She presents for her follow-up diabetic visit. She has type 2 diabetes mellitus. Onset time: She was diagnosed at approximate age of 31 years, stated by gestational diabetes with all 3 of her pregnancies in her 27s. Her disease course has been worsening (Since her last visit, she fell and injured herself.  CT scan of the head did not show any intracranial  bleeding.Marland Kitchen). There are no hypoglycemic associated symptoms. Pertinent negatives for hypoglycemia include no confusion, headaches, pallor or seizures. Pertinent negatives for diabetes include no blurred vision, no chest pain, no fatigue, no polydipsia, no polyphagia and no polyuria. There are no hypoglycemic complications. Symptoms are worsening. Diabetic complications include a CVA and heart disease. (She has history of open heart surgery for ASD in 1965.) Risk factors for coronary artery disease include dyslipidemia, diabetes mellitus, family history, obesity, hypertension, post-menopausal, sedentary lifestyle and tobacco exposure. Her weight is fluctuating minimally. She is following a generally unhealthy diet. When asked about meal planning, she reported none. She has not had a previous visit with a dietitian. She rarely (She has medical  problem with bronchiectasis for which she gets treated with steroids on and off.) participates in exercise. Her home blood glucose trend is increasing steadily. Her breakfast blood glucose range is generally 140-180 mg/dl. Her lunch blood glucose range is generally 140-180 mg/dl. Her dinner blood glucose range is generally 140-180 mg/dl. Her bedtime blood glucose range is generally 140-180 mg/dl. Her overall blood glucose range is 140-180 mg/dl. (Ms. Hubbs presents with above target glycemic profile.  Her freestyle libre CGM AGP shows 60% time range, 28% level 1 hyperglycemia, 12% able to hyperglycemia.  She does not have hypoglycemia.  Her point-of-care A1c is 7.8% increasing from 6.5%.    ) An ACE inhibitor/angiotensin II receptor blocker is being taken.  Hyperlipidemia This is a chronic problem. The current episode started more than 1 year ago. Exacerbating diseases include diabetes and obesity. Associated symptoms include myalgias. Pertinent negatives include no chest pain or shortness of breath. Current antihyperlipidemic treatment includes statins. Risk factors for coronary artery disease include dyslipidemia, diabetes mellitus, family history, obesity, hypertension, a sedentary lifestyle and post-menopausal.  Hypertension This is a chronic problem. The current episode started more than 1 year ago. The problem is uncontrolled. Pertinent negatives include no blurred vision, chest pain, headaches, palpitations or shortness of breath. Risk factors for coronary artery disease include dyslipidemia, diabetes mellitus, obesity, sedentary lifestyle and smoking/tobacco exposure. Past treatments include ACE inhibitors. Hypertensive end-organ damage includes CVA.  Other The current episode started more than 1 month ago. The problem occurs constantly. Progression since onset: Patient reports that she did have exposure to steroids related to back pain on 2 separate occasions between November 2000 21 and 2022.   Last exposure was related to her COVID infection.  She reports that she developed fatigueced that she developed fatigue. Associated symptoms include myalgias. Pertinent negatives include no arthralgias, chest pain, chills, coughing, fatigue, fever, headaches, nausea, rash or vomiting. She has tried nothing for the symptoms. The treatment provided no relief.     Objective:    BP 130/72 Comment: L arm with manuel cuff  Pulse 72   Ht 5\' 4"  (1.626 m)   Wt 217 lb 12.8 oz (98.8 kg)   BMI 37.39 kg/m   Wt Readings from Last 3 Encounters:  03/08/23 217 lb 12.8 oz (98.8 kg)  03/03/23 219 lb (99.3 kg)  02/17/23 220 lb (99.8 kg)         Latest Ref Rng & Units 03/03/2023   11:21 AM 12/01/2022   12:00 AM 07/27/2022    4:18 PM  CMP  Glucose 70 - 99 mg/dL 161     BUN 8 - 27 mg/dL 15  23       Creatinine 0.57 - 1.00 mg/dL 0.96  0.7       Sodium 134 - 144 mmol/L  141  139       Potassium 3.5 - 5.2 mmol/L 4.6  4.5       Chloride 96 - 106 mmol/L 107  103       CO2 20 - 29 mmol/L 21  19       Calcium 8.7 - 10.3 mg/dL 9.2  9.5       Total Protein 6.0 - 8.5 g/dL 6.0 - 8.5 g/dL 7.2    7.1   6.9   Total Bilirubin 0.0 - 1.2 mg/dL 0.0 - 1.2 mg/dL 0.2    0.2     Alkaline Phos 44 - 121 IU/L 44 - 121 IU/L 85    83  92       AST 0 - 40 IU/L 0 - 40 IU/L 9    11  14        ALT 0 - 32 IU/L 0 - 32 IU/L 10    10  10           This result is from an external source.   Multiple values from one day are sorted in reverse-chronological order    Lipid Panel     Component Value Date/Time   CHOL 157 03/03/2023 1121   TRIG 115 03/03/2023 1121   HDL 44 03/03/2023 1121   CHOLHDL 3.6 03/03/2023 1121   CHOLHDL 3.1 05/20/2021 1048   VLDL 22 05/20/2021 1048   LDLCALC 92 03/03/2023 1121   LABVLDL 21 03/03/2023 1121   Assessment & Plan:  1. DM type 2 causing vascular disease (HCC)  Laura [name] presents with above target glycemic profile.  Her freestyle libre CGM AGP shows 60% time range, 28% level 1  hyperglycemia, 12% able to hyperglycemia.  She does not have hypoglycemia.  Her point-of-care A1c is 7.8% increasing from 6.5%.    -her diabetes is complicated by CVA, CHF, history of open heart surgery for ASD, obesity/sedentary life, history of chronic smoking with bronchiectasis and Laura [name] remains at a high risk for more acute and chronic complications which include CAD, CVA, CKD, retinopathy, and neuropathy. These are all discussed in detail with the patient.  - I have counseled her on diet management and weight loss, by adopting a carbohydrate restricted/protein rich diet.  - she acknowledges that there is a room for improvement in her food and drink choices. - Suggestion is made for her to avoid simple carbohydrates  from her diet including Cakes, Sweet Desserts, Ice Cream, Soda (diet and regular), Sweet Tea, Candies, Chips, Cookies, Store Bought Juices, Alcohol , Artificial Sweeteners,  Coffee Creamer, and "Sugar-free" Products, Lemonade. This will help patient to have more stable blood glucose profile and potentially avoid unintended weight gain.  The following Lifestyle Medicine recommendations according to American College of Lifestyle Medicine  [facility]) were discussed and and offered to patient and she  agrees to start the journey:  A. Whole Foods, Plant-Based Nutrition comprising of fruits and vegetables, plant-based proteins, whole-grain carbohydrates was discussed in detail with the patient.   A list for source of those nutrients were also provided to the patient.  Patient will use only water or unsweetened tea for hydration. B.  The need to stay away from risky substances including alcohol, smoking; obtaining 7 to 9 hours of restorative sleep, at least 150 minutes of moderate intensity exercise weekly, the importance of healthy social connections,  and stress management techniques were discussed. C.  A full color page of  Calorie density of various food groups per pound  showing  examples of each food groups was provided to the patient.  - she is advised to stick to a routine mealtimes to eat 3 meals  a day and avoid unnecessary snacks ( to snack only to correct hypoglycemia).   - she is following  with Norm Salt, RDN, CDE for individualized diabetes education.  - I have approached her with the following individualized plan to manage diabetes and patient agrees:   -  She is advised to increase Lantus to 50 units nightly, discussed and increase Trulicity to 4.5 mg subcutaneously weekly.    -She will continue to use her CGM continuously.   She is advised to call clinic for blood glucose readings above 200 mg per DL x3, or less than 70 mg per DL.   -She is advised to continue metformin 500 mg p.o. twice daily.  - Patient specific target  A1c;  LDL, HDL, Triglycerides,  were discussed in detail.  2.  BP/HTN:  -Her blood pressure is controlled to target.  She advised to continue her current blood pressure medications including hydralazine 25 mg 3 times daily.    3) Lipids/HPL: Her most recent lipid panel showed uncontrolled LDL at 92.  She is advised to continue atorvastatin 10 mg p.o. nightly.    Side effects and precautions discussed with her .  4)  Weight/Diet: Her BMI is 37.39--clearly complicating her diabetes care.  She is a candidate for modest weight loss.   The above discussed dietary plan will help achieve weight loss. -She is working with her dietitian.     5 hypothyroidism-long-standing, etiology unclear, she has multiple family members with thyroid dysfunctions. Her previsit thyroid function tests are consistent with appropriate replacement.  She is advised to continue levothyroxine 112 mcg p.o. daily before breakfast.  - We discussed about the correct intake of her thyroid hormone, on empty stomach at fasting, with water, separated by at least 30 minutes from breakfast and other medications,  and separated by more than 4 hours from calcium, iron,  multivitamins, acid reflux medications (PPIs). -Patient is made aware of the fact that thyroid hormone replacement is needed for life, dose to be adjusted by periodic monitoring of thyroid function tests.  6) vitamin D deficiency: - She is currently on  vitamin D supplements including vitamin D3 5000 units daily.     7) Chronic Care/Health Maintenance:  -she  is on  Statin medications and  is encouraged to continue to follow up with Ophthalmology, Dentist,  Podiatrist at least yearly or according to recommendations, and advised to  stay away from smoking. I have recommended yearly flu vaccine and pneumonia vaccination at least every 5 years;  and  sleep for at least 7 hours a day.  - I advised patient to maintain close follow up with Benita Stabile, MD for primary care needs.  I spent  28  minutes in the care of the patient today including review of labs from CMP, Lipids, Thyroid Function, Hematology (current and previous including abstractions from other facilities); face-to-face time discussing  her blood glucose readings/logs, discussing hypoglycemia and hyperglycemia episodes and symptoms, medications doses, her options of short and long term treatment based on the latest standards of care / guidelines;  discussion about incorporating lifestyle medicine;  and documenting the encounter. Risk reduction counseling performed per USPSTF guidelines to reduce  obesity and cardiovascular risk factors.     Please refer to Patient Instructions for Blood Glucose Monitoring and Insulin/Medications Dosing Guide"  in media  tab for additional information. Please  also refer to " Patient Self Inventory" in the Media  tab for reviewed elements of pertinent patient history.  Laura [name] participated in the discussions, expressed understanding, and voiced agreement with the above plans.  All questions were answered to her satisfaction. she is encouraged to contact clinic should she have any questions or concerns  prior to her return visit.   Follow up plan: - Return in about 4 months (around 07/09/2023) for Bring Meter/CGM Device/Logs- A1c in Office.  Marquis Lunch, MD [facility] [address] [place], [place] [address] Phone: [phone]  Fax: [phone]    03/08/2023, 10:33 AM  This note was partially dictated with voice recognition software. Similar sounding words can be transcribed inadequately or may not  be corrected upon review.

## 2023-03-08 NOTE — Patient Instructions (Signed)

## 2023-03-09 ENCOUNTER — Other Ambulatory Visit: Payer: Self-pay | Admitting: Neurology

## 2023-03-09 ENCOUNTER — Ambulatory Visit: Payer: 59 | Admitting: Orthopedic Surgery

## 2023-03-09 DIAGNOSIS — R251 Tremor, unspecified: Secondary | ICD-10-CM

## 2023-03-10 ENCOUNTER — Other Ambulatory Visit: Payer: Self-pay | Admitting: Allergy & Immunology

## 2023-03-15 ENCOUNTER — Other Ambulatory Visit: Payer: Self-pay

## 2023-03-15 DIAGNOSIS — E1165 Type 2 diabetes mellitus with hyperglycemia: Secondary | ICD-10-CM

## 2023-03-17 DIAGNOSIS — D839 Common variable immunodeficiency, unspecified: Secondary | ICD-10-CM | POA: Diagnosis not present

## 2023-03-21 NOTE — Progress Notes (Signed)
Remote pacemaker transmission.   

## 2023-03-27 DIAGNOSIS — M170 Bilateral primary osteoarthritis of knee: Secondary | ICD-10-CM | POA: Diagnosis not present

## 2023-03-28 DIAGNOSIS — G4733 Obstructive sleep apnea (adult) (pediatric): Secondary | ICD-10-CM | POA: Diagnosis not present

## 2023-03-29 DIAGNOSIS — E039 Hypothyroidism, unspecified: Secondary | ICD-10-CM | POA: Diagnosis not present

## 2023-03-29 DIAGNOSIS — E782 Mixed hyperlipidemia: Secondary | ICD-10-CM | POA: Diagnosis not present

## 2023-03-29 DIAGNOSIS — E118 Type 2 diabetes mellitus with unspecified complications: Secondary | ICD-10-CM | POA: Diagnosis not present

## 2023-03-31 ENCOUNTER — Other Ambulatory Visit: Payer: Self-pay

## 2023-03-31 DIAGNOSIS — D839 Common variable immunodeficiency, unspecified: Secondary | ICD-10-CM | POA: Diagnosis not present

## 2023-03-31 DIAGNOSIS — E1165 Type 2 diabetes mellitus with hyperglycemia: Secondary | ICD-10-CM

## 2023-03-31 MED ORDER — TRULICITY 1.5 MG/0.5ML ~~LOC~~ SOAJ: 1.5000 mg | SUBCUTANEOUS | 0 refills | Status: DC

## 2023-04-03 DIAGNOSIS — M170 Bilateral primary osteoarthritis of knee: Secondary | ICD-10-CM | POA: Diagnosis not present

## 2023-04-04 DIAGNOSIS — E782 Mixed hyperlipidemia: Secondary | ICD-10-CM | POA: Diagnosis not present

## 2023-04-04 DIAGNOSIS — E1142 Type 2 diabetes mellitus with diabetic polyneuropathy: Secondary | ICD-10-CM | POA: Diagnosis not present

## 2023-04-04 DIAGNOSIS — I442 Atrioventricular block, complete: Secondary | ICD-10-CM | POA: Diagnosis not present

## 2023-04-04 DIAGNOSIS — I5032 Chronic diastolic (congestive) heart failure: Secondary | ICD-10-CM | POA: Diagnosis not present

## 2023-04-04 DIAGNOSIS — I10 Essential (primary) hypertension: Secondary | ICD-10-CM | POA: Diagnosis not present

## 2023-04-04 DIAGNOSIS — G4730 Sleep apnea, unspecified: Secondary | ICD-10-CM | POA: Diagnosis not present

## 2023-04-04 DIAGNOSIS — I110 Hypertensive heart disease with heart failure: Secondary | ICD-10-CM | POA: Diagnosis not present

## 2023-04-04 DIAGNOSIS — M2041 Other hammer toe(s) (acquired), right foot: Secondary | ICD-10-CM | POA: Diagnosis not present

## 2023-04-04 DIAGNOSIS — E118 Type 2 diabetes mellitus with unspecified complications: Secondary | ICD-10-CM | POA: Diagnosis not present

## 2023-04-04 DIAGNOSIS — I480 Paroxysmal atrial fibrillation: Secondary | ICD-10-CM | POA: Diagnosis not present

## 2023-04-05 ENCOUNTER — Ambulatory Visit (INDEPENDENT_AMBULATORY_CARE_PROVIDER_SITE_OTHER): Payer: 59 | Admitting: Clinical

## 2023-04-05 DIAGNOSIS — F4310 Post-traumatic stress disorder, unspecified: Secondary | ICD-10-CM | POA: Diagnosis not present

## 2023-04-05 NOTE — Progress Notes (Signed)
Virtual Visit via Video Note   I connected with Laura [name] on 04/05/2023 at  9:00 AM EST by a video enabled telemedicine application and verified that I am speaking with the correct person using two identifiers.   Location: Patient: Home Provider: Office   I discussed the limitations of evaluation and management by telemedicine and the availability of in person appointments. The patient expressed understanding and agreed to proceed.   THERAPIST PROGRESS NOTE   Session Time: 9:00 AM-:9:55 AM   Participation Level: Active   Behavioral Response: CasualAlertDepressed   Type of Therapy: Individual Therapy   Treatment Goals addressed: Coping/ non-medication treatment therapy for PTSD.   Interventions: CBT, DBT, Solution Focused, Strength-based and Supportive   Summary: Laura [name] is a 62 y.o. female who presents with PTSD. The OPT therapist worked with the patient for her ongoing OPT treatment session. The OPT therapist utilized Motivational Interviewing to assist in creating therapeutic repore.The patient in the session was engaged and work in collaboration giving feedback about her triggers and symptoms over the past few weeks.The patient spoke about her trip to [place] noting her departure flight was cancelled and she had to wait until she was able to get on another flight. The OPT therapist utilized Cognitive Behavioral Therapy through cognitive restructuring as well as worked with the patient on coping strategies to assist in management of mental health symptoms. The OPT therapist worked with the patient on consistency in getting back to her health baseline and working to be consistent with her exercise classes. The OPT therapist overviewed with the patient her basic health areas including sleep, eating, exercise, and hygiene. The patient noted that she is currently struggling with consistency with her eating habits.The OPT therapist placed emphasis on the patient keeping all  upcoming health appointments and adhering to ongoing health treatment recommendations. The OPT therapist reviewed all appointments listed coming up in the patients MyChart including upcoming appointment with [name] 04/11/2023.    Suicidal/Homicidal: Nowithout intent/plan   Therapist Response:The OPT therapist worked with the patient for the patients scheduled session. The patient was engaged in her session and gave feedback in relation to triggers, symptoms, and behavior responses over the past few weeks. The patient spoke about her trip to visit with family in [place].The OPT therapist worked with the patient utilizing an in session Cognitive Behavioral Therapy exercise.The patient has been working with her physical health providers to manage physical health conditions. The patient spoke about  getting back into the gym and being consistent in going to the [facility] as well as continuing gardening. The OPT therapist worked with the patient overviewing upcoming med management appointment with [name] and upcoming appointments as listed in her MyChart.The OPT therapist will continue treatment work with the patient in her next scheduled session.   Plan: Return again in 2/3 weeks.   Diagnosis:      Axis I: Post Traumatic Stress Disorder                            Axis II: No diagnosis     Collaboration of Care: Overview with the patient of involvement in the Med Management program with [name].   Patient/Guardian was advised Release of Information must be obtained prior to any record release in order to collaborate their care with an outside provider. Patient/Guardian was advised if they have not already done so to contact the registration department to sign all necessary forms in order  for [place] to release information regarding their care.    Consent: Patient/Guardian gives verbal consent for treatment and assignment of benefits for services provided during this visit. Patient/Guardian expressed  understanding and agreed to proceed.      I discussed the assessment and treatment plan with the patient. The patient was provided an opportunity to ask questions and all were answered. The patient agreed with the plan and demonstrated an understanding of the instructions.   The patient was advised to call back or seek an in-person evaluation if the symptoms worsen or if the condition fails to improve as anticipated.   I provided 55 minutes of non-face-to-face time during this encounter.   Suzan Garibaldi, LCSW    04/05/2023

## 2023-04-06 ENCOUNTER — Other Ambulatory Visit: Payer: Self-pay

## 2023-04-06 DIAGNOSIS — E1165 Type 2 diabetes mellitus with hyperglycemia: Secondary | ICD-10-CM

## 2023-04-06 MED ORDER — FREESTYLE LIBRE 3 SENSOR MISC: 1.0000 | 1 refills | Status: DC
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
## 2023-04-07 ENCOUNTER — Other Ambulatory Visit: Payer: Self-pay

## 2023-04-07 DIAGNOSIS — E1165 Type 2 diabetes mellitus with hyperglycemia: Secondary | ICD-10-CM

## 2023-04-07 MED ORDER — FREESTYLE LIBRE 3 SENSOR MISC: 1.0000 | 1 refills | Status: AC

## 2023-04-10 DIAGNOSIS — E1165 Type 2 diabetes mellitus with hyperglycemia: Secondary | ICD-10-CM | POA: Diagnosis not present

## 2023-04-10 DIAGNOSIS — M170 Bilateral primary osteoarthritis of knee: Secondary | ICD-10-CM | POA: Diagnosis not present

## 2023-04-11 ENCOUNTER — Ambulatory Visit (INDEPENDENT_AMBULATORY_CARE_PROVIDER_SITE_OTHER): Payer: 59 | Admitting: Psychiatry

## 2023-04-11 ENCOUNTER — Other Ambulatory Visit: Payer: Self-pay | Admitting: Psychiatry

## 2023-04-11 ENCOUNTER — Encounter: Payer: Self-pay | Admitting: Psychiatry

## 2023-04-11 VITALS — BP 165/79 | HR 67 | Temp 98.2°F | Ht 64.0 in | Wt 216.4 lb

## 2023-04-11 DIAGNOSIS — F4310 Post-traumatic stress disorder, unspecified: Secondary | ICD-10-CM | POA: Diagnosis not present

## 2023-04-11 DIAGNOSIS — F3341 Major depressive disorder, recurrent, in partial remission: Secondary | ICD-10-CM

## 2023-04-11 MED ORDER — SERTRALINE HCL 25 MG PO TABS: 25.0000 mg | ORAL_TABLET | Freq: Every day | ORAL | 0 refills | Status: DC

## 2023-04-11 MED ORDER — DIVALPROEX SODIUM ER 500 MG PO TB24: ORAL_TABLET | ORAL | 0 refills | Status: DC

## 2023-04-14 DIAGNOSIS — D839 Common variable immunodeficiency, unspecified: Secondary | ICD-10-CM | POA: Diagnosis not present

## 2023-04-19 ENCOUNTER — Other Ambulatory Visit: Payer: Self-pay

## 2023-04-19 ENCOUNTER — Encounter: Payer: Self-pay | Admitting: Family Medicine

## 2023-04-19 ENCOUNTER — Ambulatory Visit (INDEPENDENT_AMBULATORY_CARE_PROVIDER_SITE_OTHER): Payer: 59 | Admitting: Family Medicine

## 2023-04-19 VITALS — BP 110/70 | HR 78 | Temp 98.1°F | Resp 20

## 2023-04-19 DIAGNOSIS — J310 Chronic rhinitis: Secondary | ICD-10-CM

## 2023-04-19 DIAGNOSIS — K219 Gastro-esophageal reflux disease without esophagitis: Secondary | ICD-10-CM

## 2023-04-19 DIAGNOSIS — J4550 Severe persistent asthma, uncomplicated: Secondary | ICD-10-CM | POA: Diagnosis not present

## 2023-04-19 DIAGNOSIS — D839 Common variable immunodeficiency, unspecified: Secondary | ICD-10-CM

## 2023-04-19 MED ORDER — OMEPRAZOLE 20 MG PO CPDR: DELAYED_RELEASE_CAPSULE | ORAL | 2 refills | Status: DC

## 2023-04-19 NOTE — Progress Notes (Addendum)
[address] [place] [address] Dept: [phone]  FOLLOW UP NOTE  Patient ID: Laura [name], adult    DOB: Aug 19, 1961  Age: 62 y.o. MRN: [id number] Date of Office Visit: 04/19/2023  Assessment  Chief Complaint: Follow-up  HPI Laura [name] is a 62 year old female who presents to the clinic for a follow up visit. She was last seen in this clinic on 01/11/2023 by Dr. 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 Her current problem list is significant for neuropathy, headache, and tremors for which she continues to follow up with neurology, atrial fibrillation with pacemaker, and diabetes with insulin use.  At today's visit, she reports that she is feeling well overall.  She has recently traveled to [place] and reports that her symptoms have not been problematic while traveling out of state.  She reports her asthma has been moderately well-controlled with some shortness of breath, slight wheeze, and occasional cough producing thin clear mucus.  She continues Breo 100-1 puff once a day, montelukast 10 mg once a day, and rarely uses Xopenex for relief of asthma symptoms.  She continues to receive Fasenra injections once every 8 weeks with no large or local reactions.  She reports a significant decrease in her symptoms of asthma while continuing on Fasenra injections.  Reflux is reported as well-controlled with no symptoms including heartburn or vomiting.  She continues omeprazole 20 mg twice a day with relief of symptoms.  Chronic rhinitis is reported as moderately well-controlled with nasal congestion as the main symptom.  She continues azelastine as needed and is not currently using Nasacort or nasal saline rinses.  She denies infections, antibiotic use, or steroid use since her last visit to this clinic.  She continues cuvitru 27 g once every 2 weeks with no adverse reaction.  Her current medications are  listed in the chart.   Drug Allergies:  Allergies  Allergen Reactions   Promethazine Hcl Hives   Ativan [Lorazepam] Hives   Phenergan [Promethazine Hcl] Hives    Physical Exam: BP 110/70   Pulse 78   Temp 98.1 F (36.7 C)   Resp 20   SpO2 96%    Physical Exam Vitals reviewed.  Constitutional:      Appearance: Normal appearance.  HENT:     Head: Normocephalic and atraumatic.     Right Ear: Tympanic membrane normal.     Left Ear: Tympanic membrane normal.     Nose:     Comments: Bilateral naris slightly erythematous with clear nasal drainage noted.  Pharynx normal.  Ears normal.  Eyes normal.    Mouth/Throat:     Pharynx: Oropharynx is clear.  Eyes:     Conjunctiva/sclera: Conjunctivae normal.  Cardiovascular:     Rate and Rhythm: Normal rate and regular rhythm.     Heart sounds: Normal heart sounds. No murmur heard. Pulmonary:     Effort: Pulmonary effort is normal.     Breath sounds: Normal breath sounds.     Comments: Scattered rhonchi which cleared with cough.  No wheeze noted. Musculoskeletal:        General: Normal range of motion.     Cervical back: Normal range of motion and neck supple.  Skin:    General: Skin is warm and dry.  Neurological:     Mental Status: She is alert and oriented to person, place, and time.  Psychiatric:        Mood and Affect: Mood  normal.        Behavior: Behavior normal.        Thought Content: Thought content normal.        Judgment: Judgment normal.     Diagnostics: 2.57 which is 82% of predicted value, FEV1 1.89 which is 76% of predicted value.  Spirometry indicates normal ventilatory function.  Assessment and Plan: 1. Severe persistent asthma, uncomplicated   2. Common variable immunodeficiency (HCC)   3. Non-allergic rhinitis   4. Gastroesophageal reflux disease, unspecified whether esophagitis present     Meds ordered this encounter  Medications   omeprazole (PRILOSEC) 20 MG capsule    Sig: TAKE ONE CAPSULE BY  MOUTH TWICE DAILY @ 9AM & 5PM BEOFRE MEALS    Dispense:  60 capsule    Refill:  2    Patient Instructions  1. Hypogammaglobulinemia - Continue with Cuvitru.  - We will continue with 27 g every 2 weeks. - We might consider increasing the dosage if this frequency of infections worsen.   2. Chronic rhinitis   - Continue with nasal saline rinses as needed.  - Continue with Nasacort daily as instructed by your primary care provider.  - You can use this with your Astepro/azelastine. - We can do skin testing at some point if needed.   3. Severe persistent asthma, uncomplicated Lung testing looks good today - Daily medication: Continue Breo151mcg one puff once daily + montelukast 10 mg once a day + Fasenra every 8 weeks - Prior to physical activity: Xopenex 2 puffs 10-15 minutes before physical activity. - Rescue medications:  Xopenex nebulizer every 4-6 hours as needed, Xopenex 4 puffs every 4-6 hours as needed, or DuoNeb nebulizer one vial every 4-6 hours as needed - Asthma control goals:  * Full participation in all desired activities (may need albuterol before activity) * Albuterol use two time or less a week on average (not counting use with activity) * Cough interfering with sleep two time or less a month * Oral steroids no more than once a year * No hospitalizations.  4.  Reflux  -Continue dietary lifestyle modifications as listed below  -Continue omeprazole twice a day to control reflux.  Take this medication 30 minutes before a meal.    5.  Follow up in 6 months or sooner if needed   Please inform us of any Emergency Department visits, hospitalizations, or changes in symptoms. Call us before going to the ED for breathing or allergy symptoms since we might be able to fit you in for a sick visit. Feel free to contact us anytime with any questions, problems, or concerns.  It was a pleasure to see you again today!   No follow-ups on file.    Thank you for the opportunity to  care for this patient.  Please do not hesitate to contact me with questions.  Thermon Leyland, FNP [facility]

## 2023-04-19 NOTE — Patient Instructions (Addendum)
1. Hypogammaglobulinemia - Continue with Cuvitru.  - We will continue with 27 g every 2 weeks. - We might consider increasing the dosage if this frequency of infections worsen.   2. Chronic rhinitis   - Continue with nasal saline rinses as needed.  - Continue with Nasacort daily as instructed by your primary care provider.  - You can use this with your Astepro/azelastine. - We can do skin testing at some point if needed.   3. Severe persistent asthma, uncomplicated Lung testing looks good today - Daily medication: Continue Breo173mcg one puff once daily + montelukast 10 mg once a day + Fasenra every 8 weeks - Prior to physical activity: Xopenex 2 puffs 10-15 minutes before physical activity. - Rescue medications:  Xopenex nebulizer every 4-6 hours as needed, Xopenex 4 puffs every 4-6 hours as needed, or DuoNeb nebulizer one vial every 4-6 hours as needed - Asthma control goals:  * Full participation in all desired activities (may need albuterol before activity) * Albuterol use two time or less a week on average (not counting use with activity) * Cough interfering with sleep two time or less a month * Oral steroids no more than once a year * No hospitalizations.  4.  Reflux  -Continue dietary lifestyle modifications as listed below  -Continue omeprazole twice a day to control reflux.  Take this medication 30 minutes before a meal.    5.  Follow up in 6 months or sooner if needed   Please inform us of any Emergency Department visits, hospitalizations, or changes in symptoms. Call us before going to the ED for breathing or allergy symptoms since we might be able to fit you in for a sick visit. Feel free to contact us anytime with any questions, problems, or concerns.  It was a pleasure to see you again today!

## 2023-04-23 ENCOUNTER — Other Ambulatory Visit: Payer: Self-pay | Admitting: "Endocrinology

## 2023-04-23 DIAGNOSIS — E1165 Type 2 diabetes mellitus with hyperglycemia: Secondary | ICD-10-CM

## 2023-04-27 ENCOUNTER — Telehealth: Payer: Self-pay | Admitting: Cardiology

## 2023-04-27 NOTE — Telephone Encounter (Signed)
   Pre-operative Risk Assessment    Patient Name: Laura [name]  DOB: [birth year] MRN: [id number]     Request for Surgical Clearance    Procedure:  Dental Extraction - Amount of Teeth to be Pulled:  5  Date of Surgery:  Clearance TBD                                 Surgeon:  Georgia Lopes, DMD, PA Surgeon's Group or Practice Name: [facility] Phone number:  [phone] Fax number:  [phone]   Type of Clearance Requested:   - Medical  - Pharmacy:  Hold If applicable      Type of Anesthesia:  General    Additional requests/questions:    SignedSeymour Bars   04/27/2023, 9:52 AM

## 2023-04-28 ENCOUNTER — Telehealth: Payer: Self-pay | Admitting: *Deleted

## 2023-04-28 DIAGNOSIS — D839 Common variable immunodeficiency, unspecified: Secondary | ICD-10-CM | POA: Diagnosis not present

## 2023-04-28 NOTE — Telephone Encounter (Signed)
   Name: Laura [name]  DOB: 07/19/1961  MRN: [id number]  Primary Cardiologist: Dina Rich, MD   Preoperative team, please contact this patient and set up a phone call appointment for further preoperative risk assessment. Please obtain consent and complete medication review. Thank you for your help.  I confirm that guidance regarding antiplatelet and oral anticoagulation therapy has been completed and, if necessary, noted below.  Patient does NOT require pre-op antibiotics for dental procedure.   Per office protocol, patient can hold Eliquis for 1 day prior to procedure.     Joni Reining, NP 04/28/2023, 11:29 AM [facility]

## 2023-04-28 NOTE — Telephone Encounter (Signed)
Pt has been scheduled for tele pre op appt 05/08/23 @ 10 am. Med rec and consent are done.

## 2023-04-28 NOTE — Telephone Encounter (Signed)
Pt has been scheduled for tele pre op appt 05/08/23 @ 10 am. Med rec and consent are done.     Patient Consent for Virtual Visit        Oris Calmes has provided verbal consent on 04/28/2023 for a virtual visit (video or telephone).   CONSENT FOR VIRTUAL VISIT FOR:  Kennieth Rad  By participating in this virtual visit I agree to the following:  I hereby voluntarily request, consent and authorize [facility] and its employed or contracted physicians, physician assistants, nurse practitioners or other licensed health care professionals (the Practitioner), to provide me with telemedicine health care services (the "Services") as deemed necessary by the treating Practitioner. I acknowledge and consent to receive the Services by the Practitioner via telemedicine. I understand that the telemedicine visit will involve communicating with the Practitioner through live audiovisual communication technology and the disclosure of certain medical information by electronic transmission. I acknowledge that I have been given the opportunity to request an in-person assessment or other available alternative prior to the telemedicine visit and am voluntarily participating in the telemedicine visit.  I understand that I have the right to withhold or withdraw my consent to the use of telemedicine in the course of my care at any time, without affecting my right to future care or treatment, and that the Practitioner or I may terminate the telemedicine visit at any time. I understand that I have the right to inspect all information obtained and/or recorded in the course of the telemedicine visit and may receive copies of available information for a reasonable fee.  I understand that some of the potential risks of receiving the Services via telemedicine include:  Delay or interruption in medical evaluation due to technological equipment failure or disruption; Information transmitted may not be sufficient (e.g.  poor resolution of images) to allow for appropriate medical decision making by the Practitioner; and/or  In rare instances, security protocols could fail, causing a breach of personal health information.  Furthermore, I acknowledge that it is my responsibility to provide information about my medical history, conditions and care that is complete and accurate to the best of my ability. I acknowledge that Practitioner's advice, recommendations, and/or decision may be based on factors not within their control, such as incomplete or inaccurate data provided by me or distortions of diagnostic images or specimens that may result from electronic transmissions. I understand that the practice of medicine is not an exact science and that Practitioner makes no warranties or guarantees regarding treatment outcomes. I acknowledge that a copy of this consent can be made available to me via my patient portal [facility] MyChart), or I can request a printed copy by calling the office of [facility].    I understand that my insurance will be billed for this visit.   I have read or had this consent read to me. I understand the contents of this consent, which adequately explains the benefits and risks of the Services being provided via telemedicine.  I have been provided ample opportunity to ask questions regarding this consent and the Services and have had my questions answered to my satisfaction. I give my informed consent for the services to be provided through the use of telemedicine in my medical care

## 2023-04-28 NOTE — Telephone Encounter (Signed)
Patient with diagnosis of afib on Eliquis for anticoagulation.    Procedure: Dental extraction of 5 teeth Date of procedure: TBD   CHA2DS2-VASc Score = 5   This indicates a 7.2% annual risk of stroke. The patient's score is based upon: CHF History: 1 HTN History: 0 Diabetes History: 1 Stroke History: 2 Vascular Disease History: 0 Age Score: 0 Gender Score: 1      CrCl 97 ml/min  Patient does NOT require pre-op antibiotics for dental procedure.  Per office protocol, patient can hold Eliquis for 1 day prior to procedure.    **This guidance is not considered finalized until pre-operative APP has relayed final recommendations.**

## 2023-05-04 ENCOUNTER — Other Ambulatory Visit: Payer: Self-pay | Admitting: Neurology

## 2023-05-04 ENCOUNTER — Other Ambulatory Visit: Payer: Self-pay | Admitting: Internal Medicine

## 2023-05-04 ENCOUNTER — Other Ambulatory Visit: Payer: Self-pay | Admitting: Cardiology

## 2023-05-04 DIAGNOSIS — R251 Tremor, unspecified: Secondary | ICD-10-CM

## 2023-05-05 ENCOUNTER — Other Ambulatory Visit: Payer: Self-pay | Admitting: "Endocrinology

## 2023-05-05 ENCOUNTER — Other Ambulatory Visit: Payer: Self-pay | Admitting: *Deleted

## 2023-05-05 DIAGNOSIS — E1165 Type 2 diabetes mellitus with hyperglycemia: Secondary | ICD-10-CM

## 2023-05-05 MED ORDER — LANTUS SOLOSTAR 100 UNIT/ML ~~LOC~~ SOPN: 50.0000 [IU] | PEN_INJECTOR | Freq: Every day | SUBCUTANEOUS | 1 refills | Status: DC

## 2023-05-05 NOTE — Telephone Encounter (Signed)
Prescription refill request for Eliquis received. Indication:afib Last office visit:4/24 Scr:0.69  5/24 Age: 62 Weight:98.2  kg  Prescription refilled

## 2023-05-08 ENCOUNTER — Ambulatory Visit: Payer: 59 | Attending: Cardiovascular Disease

## 2023-05-08 DIAGNOSIS — Z01810 Encounter for preprocedural cardiovascular examination: Secondary | ICD-10-CM | POA: Diagnosis not present

## 2023-05-08 NOTE — Addendum Note (Signed)
Addended by: Elizabeth Palau on: 05/08/2023 12:47 PM   Modules accepted: Level of Service

## 2023-05-08 NOTE — Progress Notes (Signed)
Virtual Visit via Telephone Note   Because of Laura [name]'s co-morbid illnesses, she is at least at moderate risk for complications without adequate follow up.  This format is felt to be most appropriate for this patient at this time.  The patient did not have access to video technology/had technical difficulties with video requiring transitioning to audio format only (telephone).  All issues noted in this document were discussed and addressed.  No physical exam could be performed with this format.  Please refer to the patient's chart for her consent to telehealth for [facility].  Evaluation Performed:  Preoperative cardiovascular risk assessment _____________   Date:  05/08/2023   Patient ID:  Laura [name], DOB September 20, 1961, MRN [id number] Patient Location:  Home Provider location:   Office  Primary Care Provider:  Benita Stabile, MD Primary Cardiologist:  Dina Rich, MD  Chief Complaint / Patient Profile   62 y.o. 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  History of Present Illness    Laura [name] is a 62 y.o. adult who presents via audio/video conferencing for a telehealth visit today.  Pt was last seen in cardiology clinic on 01/23/2023 by Dr. Dina Rich.  At that time Marcelene Duane was doing well with no recent chest pain concerns but BP was elevated in office but at goal at home.  The patient is now pending procedure as outlined above. Since her last visit, she has been doing well with no cardiac complaints.  She is exercising and working out and overall feeling good.  She denies chest pain, shortness of breath, lower extremity edema, fatigue, palpitations, melena, hematuria, hemoptysis, diaphoresis, weakness,  presyncope, syncope, orthopnea, and PND.     Past Medical History    Past Medical History:  Diagnosis Date   Anemia    Aortic atherosclerosis (HCC)    Asthma    Atrial fibrillation and flutter (HCC)    a.) CHA2DS2VASc = 6 (sex, HTN, TIA x2, vascular disease history, T2DM);  b.) DCCV ~ 2009; c.) DCCV 08/29/2018 (200J x1 --> SR with 1st degree AVB); d.) rate/rhythm maintained on oral diltiazem + bisoprolol; chronically anticoagulated with apixaban   Bipolar disorder (HCC)    Carpal tunnel syndrome of right wrist    CHB (complete heart block) (HCC)    a.) s/p St. Jude dual chamber PPM placement   CHF (congestive heart failure) (HCC)    Complication of anesthesia    a.) "allergic reaction" to perioperative analgesics + anesthetic medications in [place]; unsure of combination; not able to recount reactions/events --> states "I ended up in the ICU for 5 days"   Constipation    a.) on linaclotide   DDD (degenerative disc disease), thoracic    GERD (gastroesophageal reflux disease)    H/O congenital atrial septal defect (ASD) repair    Hallux valgus of right foot    Hammertoe of second toe of right foot    History of 2019 novel coronavirus disease (COVID-19)    a.) 10/2020; b.) 05/2021   HLD (hyperlipidemia)    HTN (hypertension)    Hypogammaglobulinemia (HCC)    a.) on Curitru   Hypothyroid    IgE deficiency (HCC)    Long term current use of anticoagulant    a.) apixaban   Lumbar stenosis  Migraines    Mitral stenosis 02/08/2022   a.) TTE 02/08/2022: EF 60-65%, mild MAC, mild MV stenosis (peak grad 14.6 / mean grad 4.0)   Neuropathy    NSVT (nonsustained ventricular tachycardia) (HCC) 10/31/2018   a.) Holter 10/31/2018: 6 beat run   Obesity    OSA on CPAP    Osteoarthritis    Presence of permanent cardiac 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 Jude dual chamber device; placed for symptomatic bradycardia secondary to intermittent CHB   PTSD (post-traumatic stress disorder)     Pulmonary nodules    Recurrent sinusitis    Short-term memory loss    Sleep difficulties    a.) takes melatonin   Symptomatic bradycardia    a.) s/p St. Jude dual chamber PPM placement   Syncope    TIA (transient ischemic attack)    x3-last one in 2015   Tremors of nervous system    Type 2 diabetes mellitus (HCC)    Past Surgical History:  Procedure Laterality Date   ASD REPAIR  1968   BIOPSY  01/26/2021   Procedure: BIOPSY;  Surgeon: Lanelle Bal, DO;  [facility];  Service: Endoscopy;;  gastric   CARDIOVERSION  2009   CARDIOVERSION N/A 08/29/2018   Procedure: CARDIOVERSION;  Surgeon: Antoine Poche, MD;  [facility]: [facility];  Service: Endoscopy;  Laterality: N/A;   CARPAL TUNNEL RELEASE Bilateral    CESAREAN SECTION  1986, 1988, 1994   COLONOSCOPY WITH PROPOFOL N/A 01/26/2021   Procedure: COLONOSCOPY WITH PROPOFOL;  Surgeon: Lanelle Bal, DO;  [facility];  Service: Endoscopy;  Laterality: N/A;  am appt, diabetic   ESOPHAGOGASTRODUODENOSCOPY (EGD) WITH PROPOFOL N/A 01/26/2021   Procedure: ESOPHAGOGASTRODUODENOSCOPY (EGD) WITH PROPOFOL;  Surgeon: Lanelle Bal, DO;  [facility];  Service: Endoscopy;  Laterality: N/A;   HALLUX VALGUS AUSTIN Right 07/29/2022   Procedure: HALLUX VALGUS;  Surgeon: Edwin Cap, DPM;  Location: [facility];  Service: Podiatry;  Laterality: Right;   HAMMER TOE SURGERY Right 07/29/2022   Procedure: HAMMER TOE CORRECTION 2ND;  Surgeon: Edwin Cap, DPM;  Location: [facility];  Service: Podiatry;  Laterality: Right;   LEFT HEART CATH AND CORONARY ANGIOGRAPHY N/A 08/30/2019   Procedure: LEFT HEART CATH AND CORONARY ANGIOGRAPHY;  Surgeon: Corky Crafts, MD;  Location: [facility] INVASIVE CV LAB;  Service: Cardiovascular;  Laterality: N/A;   NASAL SINUS SURGERY  2017   PACEMAKER IMPLANT N/A 03/03/2022   Procedure: PACEMAKER IMPLANT;  Surgeon: Marinus Maw, MD;  Location: [facility] INVASIVE CV LAB;   Service: Cardiovascular;  Laterality: N/A;   POLYPECTOMY  01/26/2021   Procedure: POLYPECTOMY;  Surgeon: Lanelle Bal, DO;  [facility]: [facility];  Service: Endoscopy;;  colon    Allergies  Allergies  Allergen Reactions   Promethazine Hcl Hives   Ativan [Lorazepam] Hives   Phenergan [Promethazine Hcl] Hives    Home Medications    Prior to Admission medications   Medication Sig Start Date End Date Taking? Authorizing Provider  albuterol (PROVENTIL) (2.5 MG/3ML) 0.083% nebulizer solution Take 3 mLs (2.5 mg total) by nebulization every 4 (four) hours as needed for wheezing or shortness of breath. 04/07/21   Nehemiah Settle, FNP  albuterol (VENTOLIN HFA) 108 (90 Base) MCG/ACT inhaler INHALE 2 PUFFS INTO THE LUNGS EVERY 6 HOURS AS NEEDED FOR WHEEZING OR SHORTNESS OF BREATH 04/07/21   Nehemiah Settle, FNP  atorvastatin (LIPITOR) 20 MG tablet TAKE ONE TABLET BY MOUTH DAILY  AT 5PM 12/26/22   [provider]  Azelastine HCl 137 MCG/SPRAY SOLN Place into both nostrils. 03/24/23   [provider]  BD PEN NEEDLE NANO 2ND GEN 32G X 4 MM MISC 1 each by Other route in the morning, at noon, in the evening, and at bedtime. 05/16/22   Roma Kayser, MD  bisoprolol (ZEBETA) 10 MG tablet TAKE ONE TABLET BY MOUTH DAILY AT 9AM 05/04/23   Antoine Poche, MD  blood glucose meter kit and supplies 1 each by Other route 4 (four) times daily. Dispense based on patient and insurance preference. Use up to four times daily as directed. (FOR ICD-10 E10.9, E11.9). 01/27/21   Roma Kayser, MD  Cholecalciferol (VITAMIN D3) 125 MCG (5000 UT) CAPS Take 1 capsule (5,000 Units total) by mouth daily. 11/25/20   Roma Kayser, MD  Continuous Glucose Sensor (FREESTYLE LIBRE 3 SENSOR) MISC 1 Piece by Does not apply route every 14 (fourteen) days. Place 1 sensor on the skin every 14 days. Use to check glucose continuously 04/07/23   Roma Kayser, MD  CUVITRU 1 GM/5ML SOLN Inject  into the skin. 06/22/22   [provider]  CUVITRU 10 GM/50ML SOLN Inject 25 mg into the skin every 14 (fourteen) days. 01/24/22   [provider]  CUVITRU 2 GM/10ML SOLN Inject into the skin. 06/22/22   [provider]  CUVITRU 4 GM/20ML SOLN Inject into the skin. 06/22/22   [provider]  diltiazem (CARDIZEM CD) 300 MG 24 hr capsule TAKE ONE CAPSULE BY MOUTH DAILY AT 9AM 05/04/23   Antoine Poche, MD  divalproex (DEPAKOTE ER) 500 MG 24 hr tablet Take 1 tablet (500 mg total) by mouth daily AND 2 tablets (1,000 mg total) at bedtime. 04/22/23 07/21/23  [name], MD  Dulaglutide (TRULICITY) 1.5 MG/0.5ML SOPN INJECT 1.5 MG UNDER THE SKIN ONCE A WEEK 04/25/23   Roma Kayser, MD  Dulaglutide (TRULICITY) 4.5 MG/0.5ML SOPN Inject 4.5 mg as directed once a week. 03/08/23   Roma Kayser, MD  ELDERBERRY PO Take 4 g by mouth daily. Gummies 2g each    [provider]  ELIQUIS 5 MG TABS tablet TAKE ONE TABLET BY MOUTH TWICE DAILY @ 9AM & 5PM 05/05/23   Antoine Poche, MD  EPINEPHrine 0.3 mg/0.3 mL IJ SOAJ injection Inject 0.3 mg into the muscle once as needed for anaphylaxis. 01/17/20   [provider]  FASENRA PEN 30 MG/ML SOAJ INJECT 30MG  SUBCUTANEOUSLY EVERY 8 WEEKS 02/20/23   Alfonse Spruce, MD  ferrous sulfate 324 MG TBEC Take 324 mg by mouth every evening.    [provider]  fluticasone furoate-vilanterol (BREO ELLIPTA) 100-25 MCG/ACT AEPB INHALE 1 PUFF INTO LUNGS DAILY (BULK) 02/16/23   Oretha Milch, MD  gabapentin (NEURONTIN) 600 MG tablet Take 600 mg by mouth 3 (three) times daily. 07/20/21   [provider]  hydrOXYzine (ATARAX) 25 MG tablet TAKE ONE TABLET BY MOUTH THREE TIMES DAILY AS NEEDED FOR ANXIETY OR FOR ITCHING 03/10/23   Alfonse Spruce, MD  Immune Globulin, Human, (CUVITRU [place]) Inject 27 mg into the skin every 14 (fourteen) days. 12/24/21   [provider]  insulin glargine  (LANTUS SOLOSTAR) 100 UNIT/ML Solostar Pen Inject 50 Units into the skin at bedtime. 05/05/23   Roma Kayser, MD  Insulin Pen Needle (PEN NEEDLES 3/16") 31G X 5 MM MISC Use as directed with insulin pen 10/21/21   Purcell Nails  W, MD  levalbuterol (XOPENEX HFA) 45 MCG/ACT inhaler INHALE TWO PUFFS INTO LUNGS EVERY 6 HOURS AS NEEDED FOR WHEEZING (BULK) 09/16/22   Ambs, Norvel Richards, FNP  levalbuterol Pauline Aus) 1.25 MG/3ML nebulizer solution Inhale 1.25 mg into the lungs daily as needed (asthma). 02/09/22   [provider]  levothyroxine (SYNTHROID, LEVOTHROID) 112 MCG tablet Take 112 mcg by mouth daily before breakfast.    [provider]  lidocaine (LIDODERM) 5 % Place 1 patch onto the skin daily. Remove & Discard patch within 12 hours or as directed by MD 03/03/23   Lazaro Arms, MD  linaclotide [facility]) 145 MCG CAPS capsule Take 1 capsule (145 mcg total) by mouth daily before breakfast. 03/02/23   Tiffany Kocher, PA-C  losartan (COZAAR) 50 MG tablet Take 50 mg by mouth every morning.    [provider]  MELATONIN GUMMIES PO Take 10 mg by mouth at bedtime.    [provider]  metFORMIN (GLUCOPHAGE) 500 MG tablet Take 500 mg by mouth 2 (two) times daily. 07/21/21   [provider]  montelukast (SINGULAIR) 10 MG tablet Take 10 mg by mouth at bedtime. 11/23/20   [provider]  Multiple Vitamins-Minerals (MULTIVITAMIN WITH MINERALS) tablet Take 1 tablet by mouth daily. Woman    [provider]  Nebulizer MISC Nebulizer tubing kit 10/21/20   Alfonse Spruce, MD  omeprazole (PRILOSEC) 20 MG capsule TAKE ONE CAPSULE BY MOUTH TWICE DAILY @ 9AM & 5PM BEOFRE MEALS 04/19/23   Ambs, Norvel Richards, FNP  ondansetron (ZOFRAN-ODT) 8 MG disintegrating tablet Take 8 mg by mouth daily as needed for vomiting or nausea.    [provider]  [facility] ULTRA test strip 1 each by Other route in the morning, at noon, in the evening, and at bedtime.  10/17/19   [provider]  polyethylene glycol (MIRALAX / GLYCOLAX) 17 g packet Take 17 g by mouth daily as needed. Patient taking differently: Take 17 g by mouth daily as needed for moderate constipation or severe constipation. 04/17/21   [place], Alvira Philips, DO  Potassium Chloride ER 20 MEQ TBCR Take 20 mEq by mouth daily. 01/04/21   [provider]  sertraline (ZOLOFT) 25 MG tablet Take 1 tablet (25 mg total) by mouth at bedtime. 06/27/23 09/25/23  [name], MD  Spacer/Aero-Holding Chambers (AEROCHAMBER PLUS) inhaler 1 each by Other route as needed for other. Use as instructed 02/07/22   Hetty Blend, FNP  topiramate (TOPAMAX) 50 MG tablet Take 1 tablet (50 mg total) by mouth daily. 01/27/23   Marcos Eke, PA-C  torsemide (DEMADEX) 20 MG tablet Take 20 mg by mouth daily.    [provider]  triamcinolone cream (KENALOG) 0.1 % Apply 1 application. topically daily as needed (foot). 03/15/21   [provider]  TURMERIC PO Take 1 tablet by mouth daily at 6 (six) AM.    [provider]  vitamin C (ASCORBIC ACID) 500 MG tablet Take 500 mg by mouth daily. Power C immune support    [provider]    Physical Exam    Vital Signs:  Doylene Niezgoda does not have vital signs available for review today.  Given telephonic nature of communication, physical exam is limited. AAOx3. NAD. Normal affect.  Speech and respirations are unlabored.  Accessory Clinical Findings    None  Assessment & Plan    1.  Preoperative Cardiovascular Risk Assessment:  -Patient's RCRI score is 11%  The patient affirms  she has been doing well without any new cardiac symptoms. They are able to achieve 7 METS without cardiac limitations. Therefore, based on ACC/AHA guidelines, the patient would be at acceptable risk for the planned procedure without further cardiovascular testing. The patient was advised that if she develops new symptoms prior to surgery to contact our office  to arrange for a follow-up visit, and she verbalized understanding.   The patient was advised that if she develops new symptoms prior to surgery to contact our office to arrange for a follow-up visit, and she verbalized understanding.  Per office protocol, patient can hold Eliquis for 1 day prior to procedure.  No SBE prophylaxis necessary for procedure  A copy of this note will be routed to requesting surgeon.  Time:   Today, I have spent 10 minutes with the patient with telehealth technology discussing medical history, symptoms, and management plan.     Napoleon Form, Leodis Rains, NP  05/08/2023, 7:23 AM

## 2023-05-09 ENCOUNTER — Ambulatory Visit (INDEPENDENT_AMBULATORY_CARE_PROVIDER_SITE_OTHER): Payer: 59 | Admitting: Clinical

## 2023-05-09 DIAGNOSIS — F4310 Post-traumatic stress disorder, unspecified: Secondary | ICD-10-CM

## 2023-05-09 NOTE — Progress Notes (Signed)
Virtual Visit via Video Note   I connected with Laura [name] on 05/09/2023 at  11:00 AM EST by a video enabled telemedicine application and verified that I am speaking with the correct person using two identifiers.   Location: Patient: Home Provider: Office   I discussed the limitations of evaluation and management by telemedicine and the availability of in person appointments. The patient expressed understanding and agreed to proceed.   THERAPIST PROGRESS NOTE   Session Time: 11:00 AM-:11:55 AM   Participation Level: Active   Behavioral Response: CasualAlertDepressed   Type of Therapy: Individual Therapy   Treatment Goals addressed: Coping/ non-medication treatment therapy for PTSD.   Interventions: CBT, DBT, Solution Focused, Strength-based and Supportive   Summary: Laura [name] is a 62 y.o. female who presents with PTSD. The OPT therapist worked with the patient for her ongoing OPT treatment session. The OPT therapist utilized Motivational Interviewing to assist in creating therapeutic repore.The patient in the session was engaged and work in collaboration giving feedback about her triggers and symptoms over the past few weeks.The patient spoke about pet sitting for her daughter and looking forward to going back and will be watching her grandchildren next week. The OPT therapist utilized Cognitive Behavioral Therapy through cognitive restructuring as well as worked with the patient on coping strategies to assist in management of mental health symptoms. The OPT therapist worked with the patient on consistency in getting back to her health baseline and working to be consistent with her exercise classes. The OPT therapist overviewed with the patient her basic health areas including sleep, eating, exercise, and hygiene. The patient noted that she is currently struggling with consistency with her eating habits. The patient was seen by her dentist and will need oral surgery for extraction of  5 teeth and she was cleared by her cardiologist so she will be setting up the surgery at [facility].The OPT therapist placed emphasis on the patient keeping all upcoming health appointments and adhering to ongoing health treatment recommendations. The OPT therapist reviewed all appointments listed coming up in the patients MyChart including upcoming appointment with [name] 07/12/2023.    Suicidal/Homicidal: Nowithout intent/plan   Therapist Response:The OPT therapist worked with the patient for the patients scheduled session. The patient was engaged in her session and gave feedback in relation to triggers, symptoms, and behavior responses over the past few weeks. The patient spoke about her trip to daughters house nad pet sitting for her for the past week and looking forward in going back and watching her grandchildren for a week.The OPT therapist worked with the patient utilizing an in session Cognitive Behavioral Therapy exercise.The patient has been working with her physical health providers to manage physical health conditions. The patient spoke about continuing to go to the gym and being consistent in going to the [facility] as well as continuing gardening. The OPT therapist worked with the patient overviewing med management adherence and her involvement with [name] in the med therapy program.The OPT therapist will continue treatment work with the patient in her next scheduled session.   Plan: Return again in 2/3 weeks.   Diagnosis:      Axis I: Post Traumatic Stress Disorder                            Axis II: No diagnosis     Collaboration of Care: Overview with the patient of involvement in the Med Management program with Dr.  Hisada.   Patient/Guardian was advised Release of Information must be obtained prior to any record release in order to collaborate their care with an outside provider. Patient/Guardian was advised if they have not already done so to contact the registration  department to sign all necessary forms in order for [place] to release information regarding their care.    Consent: Patient/Guardian gives verbal consent for treatment and assignment of benefits for services provided during this visit. Patient/Guardian expressed understanding and agreed to proceed.      I discussed the assessment and treatment plan with the patient. The patient was provided an opportunity to ask questions and all were answered. The patient agreed with the plan and demonstrated an understanding of the instructions.   The patient was advised to call back or seek an in-person evaluation if the symptoms worsen or if the condition fails to improve as anticipated.   I provided 55 minutes of non-face-to-face time during this encounter.   Suzan Garibaldi, LCSW    05/09/2023

## 2023-05-11 ENCOUNTER — Telehealth: Payer: Self-pay | Admitting: Physician Assistant

## 2023-05-11 DIAGNOSIS — R251 Tremor, unspecified: Secondary | ICD-10-CM

## 2023-05-11 NOTE — Telephone Encounter (Signed)
Pt pharmacy called requesting a refill on pts medication

## 2023-05-12 DIAGNOSIS — D839 Common variable immunodeficiency, unspecified: Secondary | ICD-10-CM | POA: Diagnosis not present

## 2023-05-12 MED ORDER — TOPIRAMATE 100 MG PO TABS: 100.0000 mg | ORAL_TABLET | Freq: Two times a day (BID) | ORAL | 2 refills | Status: DC

## 2023-05-12 MED ORDER — TOPIRAMATE 50 MG PO TABS: 50.0000 mg | ORAL_TABLET | Freq: Every day | ORAL | 2 refills | Status: DC

## 2023-05-12 NOTE — Telephone Encounter (Signed)
Pt called to see what medication she needs refill and what pharmacy she uses

## 2023-05-12 NOTE — Telephone Encounter (Signed)
Pt needs refill on topiramate sent to [facility]

## 2023-05-24 ENCOUNTER — Other Ambulatory Visit: Payer: Self-pay | Admitting: Pulmonary Disease

## 2023-05-24 ENCOUNTER — Other Ambulatory Visit: Payer: Self-pay | Admitting: Psychiatry

## 2023-05-26 DIAGNOSIS — D839 Common variable immunodeficiency, unspecified: Secondary | ICD-10-CM | POA: Diagnosis not present

## 2023-05-29 ENCOUNTER — Ambulatory Visit (INDEPENDENT_AMBULATORY_CARE_PROVIDER_SITE_OTHER): Payer: 59 | Admitting: Clinical

## 2023-05-29 DIAGNOSIS — F4310 Post-traumatic stress disorder, unspecified: Secondary | ICD-10-CM

## 2023-05-29 NOTE — Progress Notes (Signed)
Virtual Visit via Video Note   I connected with Laura [name] on 05/29/2023 at  1:00 PM EST by a video enabled telemedicine application and verified that I am speaking with the correct person using two identifiers.   Location: Patient: Home Provider: Office   I discussed the limitations of evaluation and management by telemedicine and the availability of in person appointments. The patient expressed understanding and agreed to proceed.   THERAPIST PROGRESS NOTE   Session Time: 1:00 PM-:1:45 PM   Participation Level: Active   Behavioral Response: CasualAlertDepressed   Type of Therapy: Individual Therapy   Treatment Goals addressed: Coping/ non-medication treatment therapy for PTSD.   Interventions: CBT, DBT, Solution Focused, Strength-based and Supportive   Summary: Laura [name] is a 62 y.o. female who presents with PTSD. The OPT therapist worked with the patient for her ongoing OPT treatment session. The OPT therapist utilized Motivational Interviewing to assist in creating therapeutic repore.The patient in the session was engaged and work in collaboration giving feedback about her triggers and symptoms over the past few weeks.The patient spoke about  watching her grandchildren since the last session. The patient spoke about getting bit by the family dog and displayed the healing injury. 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 The patient noted she has to have dental surgery on September 24th. The patient spoke about the upcoming 51yr anniversary of the loss of a partner and her plan to stay busy to cope.The OPT therapist utilized Cognitive Behavioral Therapy through cognitive restructuring as well as worked with the patient on coping strategies to assist in management of mental health  symptoms. The OPT therapist worked with the patient on consistency in getting back to her health baseline and working to be consistent with her exercise classes. The OPT therapist overviewed with the patient her basic health areas including sleep, eating, exercise, and hygiene.The OPT therapist placed emphasis on the patient keeping all upcoming health appointments and adhering to ongoing health treatment recommendations. The OPT therapist reviewed all appointments listed coming up in the patients MyChart including upcoming appointment with [name] 07/12/2023.    Suicidal/Homicidal: Nowithout intent/plan   Therapist Response:The OPT therapist worked with the patient for the patients scheduled session. The patient was engaged in her session and gave feedback in relation to triggers, symptoms, and behavior responses over the past few weeks. The patient spoke about her trip to daughters house and watching her grandchildren for a week.The OPT therapist worked with the patient utilizing an in session Cognitive Behavioral Therapy exercise.The patient has been working with her physical health providers to manage physical health conditions. The patient spoke about continuing to go to the gym and being consistent in going to the [facility] with a goal of getting her weight down to 200lbs by the end of the month, as well as continuing gardening. The OPT therapist worked with the patient overviewing med management adherence and her involvement with [name] in the med therapy program.The OPT therapist will continue treatment work with the patient in her next scheduled session.   Plan: Return again in 2/3 weeks.   Diagnosis:      Axis I: Post Traumatic Stress Disorder  Axis II: No diagnosis     Collaboration of Care: Overview with the patient of involvement in the Med Management program with [name].   Patient/Guardian was advised Release of Information must be obtained prior to any  record release in order to collaborate their care with an outside provider. Patient/Guardian was advised if they have not already done so to contact the registration department to sign all necessary forms in order for [place] to release information regarding their care.    Consent: Patient/Guardian gives verbal consent for treatment and assignment of benefits for services provided during this visit. Patient/Guardian expressed understanding and agreed to proceed.      I discussed the assessment and treatment plan with the patient. The patient was provided an opportunity to ask questions and all were answered. The patient agreed with the plan and demonstrated an understanding of the instructions.   The patient was advised to call back or seek an in-person evaluation if the symptoms worsen or if the condition fails to improve as anticipated.   I provided 45 minutes of non-face-to-face time during this encounter.   Suzan Garibaldi, LCSW    05/29/2023

## 2023-05-31 ENCOUNTER — Ambulatory Visit: Payer: Medicare Other | Admitting: Neurology

## 2023-06-02 ENCOUNTER — Ambulatory Visit (INDEPENDENT_AMBULATORY_CARE_PROVIDER_SITE_OTHER): Payer: 59

## 2023-06-02 DIAGNOSIS — I495 Sick sinus syndrome: Secondary | ICD-10-CM | POA: Diagnosis not present

## 2023-06-02 LAB — CUP PACEART REMOTE DEVICE CHECK
Battery Remaining Longevity: 80 mo
Battery Remaining Percentage: 90 %
Battery Voltage: 3.01 V
Brady Statistic AP VP Percent: 36 %
Brady Statistic AP VS Percent: 1 %
Brady Statistic AS VP Percent: 8.4 %
Brady Statistic AS VS Percent: 54 %
Brady Statistic RA Percent Paced: 36 %
Brady Statistic RV Percent Paced: 44 %
Date Time Interrogation Session: 20240823040027
Implantable Lead Connection Status: 753985
Implantable Lead Connection Status: 753985
Implantable Lead Implant Date: 20230525
Implantable Lead Implant Date: 20230525
Implantable Lead Location: 753859
Implantable Lead Location: 753860
Implantable Pulse Generator Implant Date: 20230525
Lead Channel Impedance Value: 430 Ohm
Lead Channel Impedance Value: 440 Ohm
Lead Channel Pacing Threshold Amplitude: 0.75 V
Lead Channel Pacing Threshold Pulse Width: 0.5 ms
Lead Channel Sensing Intrinsic Amplitude: 1.6 mV
Lead Channel Sensing Intrinsic Amplitude: 10.8 mV
Lead Channel Setting Pacing Amplitude: 1 V
Lead Channel Setting Pacing Amplitude: 3.5 V
Lead Channel Setting Pacing Pulse Width: 0.5 ms
Lead Channel Setting Sensing Sensitivity: 0.5 mV
Pulse Gen Model: 2272
Pulse Gen Serial Number: 8084453

## 2023-06-05 ENCOUNTER — Other Ambulatory Visit: Payer: Self-pay | Admitting: "Endocrinology

## 2023-06-05 ENCOUNTER — Telehealth: Payer: Self-pay

## 2023-06-05 DIAGNOSIS — E1165 Type 2 diabetes mellitus with hyperglycemia: Secondary | ICD-10-CM

## 2023-06-05 MED ORDER — TRULICITY 1.5 MG/0.5ML ~~LOC~~ SOAJ
1.5000 mg | SUBCUTANEOUS | 1 refills | Status: DC
Start: 2023-06-05 — End: 2023-08-15

## 2023-06-05 NOTE — Telephone Encounter (Signed)
Pt called stating she continues to experience difficulty getting her Trulicity 4.5mg . Pt asked if there is a 5mg  available. I discussed with the pt that I was not aware of a 5mg  at this time. Pt states her pharmacy stated they have the 1.5mg . Requested Rx for the 1.5mg .

## 2023-06-06 NOTE — Telephone Encounter (Signed)
Pt made aware, understanding voiced. 

## 2023-06-07 ENCOUNTER — Encounter: Payer: Self-pay | Admitting: Internal Medicine

## 2023-06-07 ENCOUNTER — Ambulatory Visit: Payer: 59 | Admitting: Internal Medicine

## 2023-06-07 VITALS — BP 162/94 | HR 68 | Ht 64.0 in | Wt 207.0 lb

## 2023-06-07 DIAGNOSIS — I495 Sick sinus syndrome: Secondary | ICD-10-CM

## 2023-06-07 NOTE — Patient Instructions (Signed)
 Medication Instructions:  Your physician recommends that you continue on your current medications as directed. Please refer to the Current Medication list given to you today.  *If you need a refill on your cardiac medications before your next appointment, please call your pharmacy*  Lab Work: None ordered.  If you have labs (blood work) drawn today and your tests are completely normal, you will receive your results only by: MyChart Message (if you have MyChart) OR A paper copy in the mail If you have any lab test that is abnormal or we need to change your treatment, we will call you to review the results.  Testing/Procedures: None ordered.  Follow-Up: At [facility], you and your health needs are our priority.  As part of our continuing mission to provide you with exceptional heart care, we have created designated Provider Care Teams.  These Care Teams include your primary Cardiologist (physician) and Advanced Practice Providers (APPs -  Physician Assistants and Nurse Practitioners) who all work together to provide you with the care you need, when you need it.   Your next appointment:   1 year(s)  The format for your next appointment:   In Person  Provider:   Lewayne Bunting, MD{or one of the following Advanced Practice Providers on your designated Care Team:   Francis Dowse, [place] Casimiro Needle "Mardelle Matte" [place], [place] Earnest Rosier, NP    Important Information About Sugar

## 2023-06-07 NOTE — Progress Notes (Signed)
Remote pacemaker transmission.   

## 2023-06-07 NOTE — Progress Notes (Signed)
HPI Laura [name] returns today for ongoing evaluation of syncope and tachy brady syndrome s/p PPM insertion due to multiple symptomatic pauses. She has been cardioverted 4 times from atrial fib. She underwent PPM insertion 3 months ago. She is better. Her HR is still a little high but no pauses.  Allergies  Allergen Reactions   Promethazine Hcl Hives   Ativan [Lorazepam] Hives   Phenergan [Promethazine Hcl] Hives     Current Outpatient Medications  Medication Sig Dispense Refill   albuterol (PROVENTIL) (2.5 MG/3ML) 0.083% nebulizer solution Take 3 mLs (2.5 mg total) by nebulization every 4 (four) hours as needed for wheezing or shortness of breath. 75 mL 1   albuterol (VENTOLIN HFA) 108 (90 Base) MCG/ACT inhaler INHALE 2 PUFFS INTO THE LUNGS EVERY 6 HOURS AS NEEDED FOR WHEEZING OR SHORTNESS OF BREATH 18 g 1   atorvastatin (LIPITOR) 20 MG tablet TAKE ONE TABLET BY MOUTH DAILY AT 5PM     Azelastine HCl 137 MCG/SPRAY SOLN Place into both nostrils.     BD PEN NEEDLE NANO 2ND GEN 32G X 4 MM MISC 1 each by Other route in the morning, at noon, in the evening, and at bedtime. 90 each 0   bisoprolol (ZEBETA) 10 MG tablet TAKE ONE TABLET BY MOUTH DAILY AT 9AM 90 tablet 3   blood glucose meter kit and supplies 1 each by Other route 4 (four) times daily. Dispense based on patient and insurance preference. Use up to four times daily as directed. (FOR ICD-10 E10.9, E11.9). 1 each 0   Cholecalciferol (VITAMIN D3) 125 MCG (5000 UT) CAPS Take 1 capsule (5,000 Units total) by mouth daily. 90 capsule 0   Continuous Glucose Sensor (FREESTYLE LIBRE 3 SENSOR) MISC 1 Piece by Does not apply route every 14 (fourteen) days. Place 1 sensor on the skin every 14 days. Use to check glucose continuously 6 each 1   CUVITRU 1 GM/5ML SOLN Inject into the skin.     CUVITRU 10 GM/50ML SOLN Inject 25 mg into the skin every 14 (fourteen) days.     CUVITRU 2 GM/10ML SOLN Inject into the skin.     CUVITRU 4 GM/20ML SOLN  Inject into the skin.     diltiazem (CARDIZEM CD) 300 MG 24 hr capsule TAKE ONE CAPSULE BY MOUTH DAILY AT 9AM 90 capsule 3   divalproex (DEPAKOTE ER) 500 MG 24 hr tablet Take 1 tablet (500 mg total) by mouth daily AND 2 tablets (1,000 mg total) at bedtime. 270 tablet 0   Dulaglutide (TRULICITY) 1.5 MG/0.5ML SOPN Inject 1.5 mg into the skin once a week. 6 mL 1   ELDERBERRY PO Take 4 g by mouth daily. Gummies 2g each     ELIQUIS 5 MG TABS tablet TAKE ONE TABLET BY MOUTH TWICE DAILY @ 9AM & 5PM 180 tablet 1   EPINEPHrine 0.3 mg/0.3 mL IJ SOAJ injection Inject 0.3 mg into the muscle once as needed for anaphylaxis.     FASENRA PEN 30 MG/ML SOAJ INJECT 30MG  SUBCUTANEOUSLY EVERY 8 WEEKS 1 mL 8   ferrous sulfate 324 MG TBEC Take 324 mg by mouth every evening.     fluticasone furoate-vilanterol (BREO ELLIPTA) 100-25 MCG/ACT AEPB INHALE 1 PUFF INTO LUNGS DAILY 180 each 3   gabapentin (NEURONTIN) 600 MG tablet Take 600 mg by mouth 3 (three) times daily.     hydrOXYzine (ATARAX) 25 MG tablet TAKE ONE TABLET BY MOUTH THREE TIMES DAILY AS NEEDED FOR ANXIETY OR  FOR ITCHING 30 tablet 1   Immune Globulin, Human, (CUVITRU [place]) Inject 27 mg into the skin every 14 (fourteen) days.     insulin glargine (LANTUS SOLOSTAR) 100 UNIT/ML Solostar Pen Inject 50 Units into the skin at bedtime. 45 mL 1   Insulin Pen Needle (PEN NEEDLES 3/16") 31G X 5 MM MISC Use as directed with insulin pen 100 each 0   levalbuterol (XOPENEX HFA) 45 MCG/ACT inhaler INHALE TWO PUFFS INTO LUNGS EVERY 6 HOURS AS NEEDED FOR WHEEZING (BULK) 15 g 11   levalbuterol (XOPENEX) 1.25 MG/3ML nebulizer solution Inhale 1.25 mg into the lungs daily as needed (asthma).     levothyroxine (SYNTHROID, LEVOTHROID) 112 MCG tablet Take 112 mcg by mouth daily before breakfast.     lidocaine (LIDODERM) 5 % Place 1 patch onto the skin daily. Remove & Discard patch within 12 hours or as directed by MD 30 patch 11   linaclotide (LINZESS) 145 MCG CAPS capsule Take 1  capsule (145 mcg total) by mouth daily before breakfast. 90 capsule 3   losartan (COZAAR) 50 MG tablet Take 50 mg by mouth every morning.     MELATONIN GUMMIES PO Take 10 mg by mouth at bedtime.     metFORMIN (GLUCOPHAGE) 500 MG tablet Take 500 mg by mouth 2 (two) times daily.     montelukast (SINGULAIR) 10 MG tablet Take 10 mg by mouth at bedtime.     Multiple Vitamins-Minerals (MULTIVITAMIN WITH MINERALS) tablet Take 1 tablet by mouth daily. Woman     Nebulizer MISC Nebulizer tubing kit 2 each 5   omeprazole (PRILOSEC) 20 MG capsule TAKE ONE CAPSULE BY MOUTH TWICE DAILY @ 9AM & 5PM BEOFRE MEALS 60 capsule 2   ondansetron (ZOFRAN-ODT) 8 MG disintegrating tablet Take 8 mg by mouth daily as needed for vomiting or nausea.     ONETOUCH ULTRA test strip 1 each by Other route in the morning, at noon, in the evening, and at bedtime.     polyethylene glycol (MIRALAX / GLYCOLAX) 17 g packet Take 17 g by mouth daily as needed. (Patient taking differently: Take 17 g by mouth daily as needed for moderate constipation or severe constipation.) 14 each 0   Potassium Chloride ER 20 MEQ TBCR Take 20 mEq by mouth daily.     [START ON 06/27/2023] sertraline (ZOLOFT) 25 MG tablet Take 1 tablet (25 mg total) by mouth at bedtime. 90 tablet 0   Spacer/Aero-Holding Chambers (AEROCHAMBER PLUS) inhaler 1 each by Other route as needed for other. Use as instructed 1 each 0   topiramate (TOPAMAX) 100 MG tablet Take 1 tablet (100 mg total) by mouth 2 (two) times daily. 180 tablet 2   topiramate (TOPAMAX) 50 MG tablet Take 1 tablet (50 mg total) by mouth daily. 90 tablet 2   torsemide (DEMADEX) 20 MG tablet Take 20 mg by mouth daily.     triamcinolone cream (KENALOG) 0.1 % Apply 1 application. topically daily as needed (foot).     TURMERIC PO Take 1 tablet by mouth daily at 6 (six) AM.     vitamin C (ASCORBIC ACID) 500 MG tablet Take 500 mg by mouth daily. Power C immune support     Current Facility-Administered Medications   Medication Dose Route Frequency Provider Last Rate Last Admin   Benralizumab SOSY 30 mg  30 mg Subcutaneous Q28 days Hetty Blend, FNP   30 mg at 12/27/21 1603     Past Medical History:  Diagnosis Date  Anemia    Aortic atherosclerosis (HCC)    Asthma    Atrial fibrillation and flutter (HCC)    a.) CHA2DS2VASc = 6 (sex, HTN, TIA x2, vascular disease history, T2DM);  b.) DCCV ~ 2009; c.) DCCV 08/29/2018 (200J x1 --> SR with 1st degree AVB); d.) rate/rhythm maintained on oral diltiazem + bisoprolol; chronically anticoagulated with apixaban   Bipolar disorder (HCC)    Carpal tunnel syndrome of right wrist    CHB (complete heart block) (HCC)    a.) s/p St. Jude dual chamber PPM placement   CHF (congestive heart failure) (HCC)    Complication of anesthesia    a.) "allergic reaction" to perioperative analgesics + anesthetic medications in [place]; unsure of combination; not able to recount reactions/events --> states "I ended up in the ICU for 5 days"   Constipation    a.) on linaclotide   DDD (degenerative disc disease), thoracic    GERD (gastroesophageal reflux disease)    H/O congenital atrial septal defect (ASD) repair    Hallux valgus of right foot    Hammertoe of second toe of right foot    History of 2019 novel coronavirus disease (COVID-19)    a.) 10/2020; b.) 05/2021   HLD (hyperlipidemia)    HTN (hypertension)    Hypogammaglobulinemia (HCC)    a.) on Curitru   Hypothyroid    IgE deficiency (HCC)    Long term current use of anticoagulant    a.) apixaban   Lumbar stenosis    Migraines    Mitral stenosis 02/08/2022   a.) TTE 02/08/2022: EF 60-65%, mild MAC, mild MV stenosis (peak grad 14.6 / mean grad 4.0)   Neuropathy    NSVT (nonsustained ventricular tachycardia) (HCC) 10/31/2018   a.) Holter 10/31/2018: 6 beat run   Obesity    OSA on CPAP    Osteoarthritis    Presence of permanent cardiac 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 Jude dual chamber device; placed for  symptomatic bradycardia secondary to intermittent CHB   PTSD (post-traumatic stress disorder)    Pulmonary nodules    Recurrent sinusitis    Short-term memory loss    Sleep difficulties    a.) takes melatonin   Symptomatic bradycardia    a.) s/p St. Jude dual chamber PPM placement   Syncope    TIA (transient ischemic attack)    x3-last one in 2015   Tremors of nervous system    Type 2 diabetes mellitus (HCC)     ROS:   All systems reviewed and negative except as noted in the HPI.   Past Surgical History:  Procedure Laterality Date   ASD REPAIR  1968   BIOPSY  01/26/2021   Procedure: BIOPSY;  Surgeon: Lanelle Bal, DO;  [facility];  Service: Endoscopy;;  gastric   CARDIOVERSION  2009   CARDIOVERSION N/A 08/29/2018   Procedure: CARDIOVERSION;  Surgeon: Antoine Poche, MD;  Location: [facility];  Service: Endoscopy;  Laterality: N/A;   CARPAL TUNNEL RELEASE Bilateral    CESAREAN SECTION  1986, 1988, 1994   COLONOSCOPY WITH PROPOFOL N/A 01/26/2021   Procedure: COLONOSCOPY WITH PROPOFOL;  Surgeon: Lanelle Bal, DO;  Location: [facility];  Service: Endoscopy;  Laterality: N/A;  am appt, diabetic   ESOPHAGOGASTRODUODENOSCOPY (EGD) WITH PROPOFOL N/A 01/26/2021   Procedure: ESOPHAGOGASTRODUODENOSCOPY (EGD) WITH PROPOFOL;  Surgeon: Lanelle Bal, DO;  [facility];  Service: Endoscopy;  Laterality: N/A;   HALLUX VALGUS [place] Right 07/29/2022  Procedure: HALLUX VALGUS;  Surgeon: Edwin Cap, DPM;  Location: [facility];  Service: Podiatry;  Laterality: Right;   HAMMER TOE SURGERY Right 07/29/2022   Procedure: HAMMER TOE CORRECTION 2ND;  Surgeon: Edwin Cap, DPM;  Location: [facility];  Service: Podiatry;  Laterality: Right;   LEFT HEART CATH AND CORONARY ANGIOGRAPHY N/A 08/30/2019   Procedure: LEFT HEART CATH AND CORONARY ANGIOGRAPHY;  Surgeon: Corky Crafts, MD;  Location: [facility] INVASIVE CV LAB;  Service: Cardiovascular;   Laterality: N/A;   NASAL SINUS SURGERY  2017   PACEMAKER IMPLANT N/A 03/03/2022   Procedure: PACEMAKER IMPLANT;  Surgeon: Marinus Maw, MD;  Location: [facility] INVASIVE CV LAB;  Service: Cardiovascular;  Laterality: N/A;   POLYPECTOMY  01/26/2021   Procedure: POLYPECTOMY;  Surgeon: Lanelle Bal, DO;  Location: [facility];  Service: Endoscopy;;  colon     Family History  Problem Relation Age of Onset   Hypertension Mother    Stroke Mother    Kidney disease Mother    Heart attack Mother    Alcohol abuse Mother    Other Father        ?colon cancer   Kidney disease Sister    Hypertension Sister    Bipolar disorder Sister    Atrial fibrillation Sister    Heart disease Brother    Prostate cancer Brother    Thyroid disease Daughter    Hashimoto's thyroiditis Daughter    Celiac disease Daughter    Allergic rhinitis Daughter    Stroke Maternal Aunt    Heart disease Maternal Aunt    Asthma Neg Hx      Social History   Socioeconomic History   Marital status: Widowed    Spouse name: Not on file   Number of children: 3   Years of education: 10   Highest education level: Not on file  Occupational History   Not on file  Tobacco Use   Smoking status: Former    Current packs/day: 0.00    Average packs/day: 1 pack/day for 28.0 years (28.0 ttl pk-yrs)    Types: Cigarettes    Start date: 45    Quit date: 2008    Years since quitting: 16.6   Smokeless tobacco: Never  Vaping Use   Vaping status: Never Used  Substance and Sexual Activity   Alcohol use: Not Currently   Drug use: Not Currently   Sexual activity: Not Currently    Birth control/protection: Post-menopausal  Other Topics Concern   Not on file  Social History Narrative   Right handed   Drinks caffeine   One story home   Social Determinants of Health   Financial Resource Strain: High Risk (01/06/2022)   Overall Financial Resource Strain (CARDIA)    Difficulty of Paying Living Expenses: Hard  Food  Insecurity: No Food Insecurity (01/06/2022)   Hunger Vital Sign    Worried About Running Out of Food in the Last Year: Never true    Ran Out of Food in the Last Year: Never true  Transportation Needs: No Transportation Needs (01/06/2022)   PRAPARE - Administrator, Civil Service (Medical): No    Lack of Transportation (Non-Medical): No  Physical Activity: Insufficiently Active (01/06/2022)   Exercise Vital Sign    Days of Exercise per Week: 4 days    Minutes of Exercise per Session: 30 min  Stress: No Stress Concern Present (01/06/2022)   [employer] of Occupational Health - Occupational Stress Questionnaire  Feeling of Stress : Only a little  Social Connections: Moderately Integrated (01/06/2022)   Social Connection and Isolation Panel [NHANES]    Frequency of Communication with Friends and Family: More than three times a week    Frequency of Social Gatherings with Friends and Family: More than three times a week    Attends Religious Services: More than 4 times per year    Active Member of [employer] or Organizations: Yes    Attends Banker Meetings: More than 4 times per year    Marital Status: Widowed  Intimate Partner Violence: Not At Risk (01/06/2022)   Humiliation, Afraid, Rape, and Kick questionnaire    Fear of Current or Ex-Partner: No    Emotionally Abused: No    Physically Abused: No    Sexually Abused: No     BP (!) 162/94   Pulse 68   Ht 5\' 4"  (1.626 m)   Wt 207 lb (93.9 kg)   SpO2 96%   BMI 35.53 kg/m   Physical Exam:  Well appearing NAD HEENT: Unremarkable Neck:  No JVD, no thyromegally Lymphatics:  No adenopathy Back:  No CVA tenderness Lungs:  Clear HEART:  Regular rate rhythm, no murmurs, no rubs, no clicks Abd:  soft, positive bowel sounds, no organomegally, no rebound, no guarding Ext:  2 plus pulses, no edema, no cyanosis, no clubbing Skin:  No rashes no nodules Neuro:  CN II through XII intact, motor grossly  intact  EKG  DEVICE  Normal device function.  See PaceArt for details.   Assess/Plan:  Symptomatic tachy-brady - she has improved. She will continue bisoprolol 10 mg daily.  Syncope - she has had none since her PPM. Follow. Autonomic dysfunction - she is improved. We will follow. Obesity - she is encouraged to continue to lose weight. She is down 14 lbs.    Laura Gowda Stephaun Million,MD

## 2023-06-09 DIAGNOSIS — D839 Common variable immunodeficiency, unspecified: Secondary | ICD-10-CM | POA: Diagnosis not present

## 2023-06-13 ENCOUNTER — Other Ambulatory Visit: Payer: Self-pay | Admitting: Physician Assistant

## 2023-06-13 DIAGNOSIS — R251 Tremor, unspecified: Secondary | ICD-10-CM

## 2023-06-14 ENCOUNTER — Telehealth: Payer: Self-pay

## 2023-06-14 ENCOUNTER — Other Ambulatory Visit: Payer: Self-pay

## 2023-06-14 DIAGNOSIS — E1165 Type 2 diabetes mellitus with hyperglycemia: Secondary | ICD-10-CM

## 2023-06-14 MED ORDER — DEXCOM G7 RECEIVER DEVI
0 refills | Status: AC
Start: 2023-06-14 — End: ?

## 2023-06-14 MED ORDER — LANTUS SOLOSTAR 100 UNIT/ML ~~LOC~~ SOPN: 30.0000 [IU] | PEN_INJECTOR | Freq: Every day | SUBCUTANEOUS | 0 refills | Status: DC

## 2023-06-14 MED ORDER — DEXCOM G7 SENSOR MISC: 2 refills | Status: DC

## 2023-06-14 NOTE — Telephone Encounter (Signed)
Dr.Nida evaluated pt's Libre CGM data. Advised pt to decrease her Lantus to 30 units at bedtime per Dr.Nida.

## 2023-06-14 NOTE — Telephone Encounter (Signed)
Left a message requesting pt return call to the office. 

## 2023-06-20 ENCOUNTER — Other Ambulatory Visit: Payer: Self-pay

## 2023-06-20 NOTE — Telephone Encounter (Signed)
Pt called to notify us that her Laura [name] 3 sensors are continuing to fall off. She has contacted the manufacturer to receive replacements but states she is completely out. Left a sensor at the front desk for her. She also asked about the Dexcom G7 she requested to be sent in. I advised her that this was sent in on 9/4 and should be going through the PA process.

## 2023-06-23 DIAGNOSIS — D839 Common variable immunodeficiency, unspecified: Secondary | ICD-10-CM | POA: Diagnosis not present

## 2023-06-26 ENCOUNTER — Other Ambulatory Visit: Payer: Self-pay

## 2023-06-26 ENCOUNTER — Other Ambulatory Visit (HOSPITAL_COMMUNITY): Payer: Self-pay

## 2023-06-26 ENCOUNTER — Telehealth: Payer: Self-pay

## 2023-06-26 MED ORDER — TOPIRAMATE 100 MG PO TABS
100.0000 mg | ORAL_TABLET | Freq: Two times a day (BID) | ORAL | 2 refills | Status: DC
  Filled 2023-06-26: qty 180, 90d supply, fill #0

## 2023-06-26 NOTE — Telephone Encounter (Signed)
Sent to [facility] for refill and okay for authorization

## 2023-06-26 NOTE — Telephone Encounter (Signed)
Patients pharmacy called the office today wanting a refill from Dr. Arbutus Leas. I let patients pharmacy know that Dr. Arbutus Leas hasn't seen this patient since 2022 and that this patient sees Marlowe Kays. They are requesting Topiramate 100 mg to go to [facility]

## 2023-06-27 ENCOUNTER — Other Ambulatory Visit (HOSPITAL_COMMUNITY): Payer: Self-pay

## 2023-06-28 NOTE — H&P (Signed)
  Patient: Laura [name]  PID: [id number]  DOB: 25-May-1961  SEX: Female   Patient referred by  DDS for extraction teeth # 5, 14, 15, 31, 32.  CC: Sensitive teeth.   Past Medical History:  Cardiac Pacemaker, Heart Murmur, Chest Pain or Angina, Heart Attack, Heart Surgery, Asthma, Hay Fever or Sinus Problems, CPAP, Snoring,  Auto-immune disorder, Stroke, Thyroid trouble, Diabetes, Bipolar disease, Pulmonary nodule, OSA with CPAP, Syncope, Atrial Fibrillation, Atrial flutter, PTSD, Congestive Heart Failure, Hypothyroid, Osteoarthritis, GERD, Bradycardia, Obese    Medications: Metformin, Albuterol, ascorbic acid, Atorvastatin, Bisoprolol-HCTZ, cuvitru [place], Diltiazem, Divalproex, Elderberry, epinephrine, Fasenra pen, Ferrous sulfate, Fluticasone, Gabapentin, Hydroxyzine, Lantus solostar, Levabuterol, Levothyroxine, Lidocaine patch, Linzess, Losartan, Melatonin gummies, Montelukast, multi-vitamin, Nebulizer, Omeprazole, Ondansetron, Polyethylene glycol, Potassium Chloride, Sertraline, topiramate, Torsemide, Triamcinolone, Trulicity, Vitamin D3    Allergies:     Phenergan, Ativan    Surgeries:   ASD repair, Nose sx, C Section, carpal tunnel, Finger surgery, pacemaker, Foot surgery     Social History       Smoking: n            Alcohol:n Drug use:n                             Exam: BMI 35.  Decay and percussion sensitivity  teeth # 5, 14, 15, 31, 32.  No purulence, edema, fluctuance, trismus. Oral cancer screening negative. Pharynx clear. No lymphadenopathy.  Panorex: Decay teeth # 5, 14, 15, 31, 32.   Assessment: ASA 3. Non-restorable teeth # 5, 14, 15, 31, 32.               Plan: 1. MD  Clearance   2. Extraction Teeth # 5, 14, 15, 31, 32.  Hospital Day surgery.                 Rx: n               Risks and complications explained. Questions answered.   Georgia Lopes, DMD

## 2023-07-03 ENCOUNTER — Ambulatory Visit (INDEPENDENT_AMBULATORY_CARE_PROVIDER_SITE_OTHER): Payer: 59 | Admitting: Clinical

## 2023-07-03 ENCOUNTER — Encounter (HOSPITAL_COMMUNITY): Payer: Self-pay | Admitting: Oral Surgery

## 2023-07-03 ENCOUNTER — Encounter: Payer: Self-pay | Admitting: Internal Medicine

## 2023-07-03 ENCOUNTER — Other Ambulatory Visit: Payer: Self-pay

## 2023-07-03 DIAGNOSIS — F4310 Post-traumatic stress disorder, unspecified: Secondary | ICD-10-CM | POA: Diagnosis not present

## 2023-07-03 NOTE — Progress Notes (Signed)
Virtual Visit via Video Note   I connected with Laura [name] on 07/03/2023 at  1:00 PM EST by a video enabled telemedicine application and verified that I am speaking with the correct person using two identifiers.   Location: Patient: Home Provider: Office   I discussed the limitations of evaluation and management by telemedicine and the availability of in person appointments. The patient expressed understanding and agreed to proceed.   THERAPIST PROGRESS NOTE   Session Time: 1:00 PM-:1:45 PM   Participation Level: Active   Behavioral Response: CasualAlertDepressed   Type of Therapy: Individual Therapy   Treatment Goals addressed: Coping/ non-medication treatment therapy for PTSD.   Interventions: CBT, DBT, Solution Focused, Strength-based and Supportive   Summary: Laura [name] is a 62 y.o. female who presents with PTSD. The OPT therapist worked with the patient for her ongoing OPT treatment session. The OPT therapist utilized Motivational Interviewing to assist in creating therapeutic repore.The patient in the session was engaged and work in collaboration giving feedback about her triggers and symptoms over the past few weeks.The patient spoke about  watching her grandchildren since the last session. The patient spoke about being nervious but prepared as she is going in tomorrow morning for dental surgery at the hospital in have 5 teeth extracted. The patient spoke about having her support person her sister who will be going with her taking her to and from the appointment.The OPT therapist utilized Cognitive Behavioral Therapy through cognitive restructuring as well as worked with the patient on coping strategies to assist in management of mental health symptoms. The OPT therapist worked with the patient on consistency in getting back to her health baseline and working to be consistent with her exercise classes. The OPT therapist overviewed with the patient her basic health areas  including sleep, eating, exercise, and hygiene.The OPT therapist placed emphasis on the patient keeping all upcoming health appointments and adhering to ongoing health treatment recommendations. The OPT therapist reviewed all appointments listed coming up in the patients MyChart including upcoming appointment with [name] 07/12/2023.    Suicidal/Homicidal: Nowithout intent/plan   Therapist Response:The OPT therapist worked with the patient for the patients scheduled session. The patient was engaged in her session and gave feedback in relation to triggers, symptoms, and behavior responses over the past few weeks. The patient spoke about her preparation for dental surgery tomorrow morning having teeth extracted.The OPT therapist worked with the patient utilizing an in session Cognitive Behavioral Therapy exercise.The patient has been working with her physical health providers to manage physical health conditions. The patient spoke about her plan for recovery post her surgery tomorrow and noted her support team in her sister will be involved to help her as she recovers from the dental surgery. The OPT therapist worked with the patient overviewing med management adherence and her involvement with [name] in the med therapy program and the upcoming appointment on 07/2023.The OPT therapist will continue treatment work with the patient in her next scheduled session.   Plan: Return again in 2/3 weeks.   Diagnosis:      Axis I: Post Traumatic Stress Disorder                            Axis II: No diagnosis     Collaboration of Care: Overview with the patient of involvement in the Med Management program with [name].   Patient/Guardian was advised Release of Information must be obtained prior to  any record release in order to collaborate their care with an outside provider. Patient/Guardian was advised if they have not already done so to contact the registration department to sign all necessary forms in  order for [place] to release information regarding their care.    Consent: Patient/Guardian gives verbal consent for treatment and assignment of benefits for services provided during this visit. Patient/Guardian expressed understanding and agreed to proceed.      I discussed the assessment and treatment plan with the patient. The patient was provided an opportunity to ask questions and all were answered. The patient agreed with the plan and demonstrated an understanding of the instructions.   The patient was advised to call back or seek an in-person evaluation if the symptoms worsen or if the condition fails to improve as anticipated.   I provided 45 minutes of non-face-to-face time during this encounter.   Suzan Garibaldi, LCSW    07/03/2023

## 2023-07-03 NOTE — Progress Notes (Addendum)
Ms Laura [name] denies chest pain or shortness of breath. Patient denies having any s/s of Covid in her household, also denies any known exposure to Covid. Ms Laura [name] denies . any s/s of upper or lower respiratory infection in the past 8 weeks.   [name] PCP is Dr. Evette Doffing, Cardiologist is Dr. Wyline Mood, Endocrinologist is Dr. Shelda Jakes.  Ms Laura [name] has type diabetes, she has a Libre Glucose monitor. I instructed Ms Laura [name] to take 1/2 of Lantus dose tonight at hs, patient said that the last dose of Trulicity was Sunday- 06/25/23, I instructed patient  I instructed patient to check CBG after awaking and every 2 hours until arrival  to the hospital. I Instructed patient if CBG is less than 70 to take 4 Glucose Tablets or 1 tube of Glucose Gel or 1/2 cup of a clear juice. Recheck CBG in 15 minutes if CBG is not over 70 call, pre- op desk at [phone] for further instructions.

## 2023-07-03 NOTE — Progress Notes (Signed)
PERIOPERATIVE PRESCRIPTION FOR IMPLANTED CARDIAC DEVICE PROGRAMMING  Patient Information: Name:  Dori Calhoon  DOB:  [birth year]  MRN:  [id number]  Planned Procedure: Teeth removal  Surgeon:  Dr. Ocie Doyne  Date of Procedure: 07/04/23  Cautery will be used.  Position during surgery:  supine   Device Information:  Clinic EP Physician:  Lewayne Bunting, MD   Device Type:  Pacemaker Manufacturer and Phone #:  [employer]: [phone] Pacemaker Dependent?:  No. Date of Last Device Check:  06/07/2023 Normal Device Function?:  Yes.    Electrophysiologist's Recommendations:  Have magnet available. Provide continuous ECG monitoring when magnet is used or reprogramming is to be performed.  Procedure should not interfere with device function.  No device programming or magnet placement needed.  Per Device Clinic Standing Orders, Wiliam Ke, RN  11:39 AM 07/03/2023

## 2023-07-04 ENCOUNTER — Other Ambulatory Visit: Payer: Self-pay

## 2023-07-04 ENCOUNTER — Ambulatory Visit (HOSPITAL_COMMUNITY)
Admission: RE | Admit: 2023-07-04 | Discharge: 2023-07-04 | Disposition: A | Discharge status: Home / Self Care | Payer: 59 | Attending: Oral Surgery | Admitting: Oral Surgery

## 2023-07-04 ENCOUNTER — Other Ambulatory Visit (HOSPITAL_COMMUNITY): Payer: Self-pay

## 2023-07-04 ENCOUNTER — Ambulatory Visit (HOSPITAL_BASED_OUTPATIENT_CLINIC_OR_DEPARTMENT_OTHER): Payer: 59

## 2023-07-04 ENCOUNTER — Ambulatory Visit (HOSPITAL_COMMUNITY): Payer: 59

## 2023-07-04 ENCOUNTER — Encounter (HOSPITAL_COMMUNITY)
Admission: RE | Disposition: A | Discharge status: Home / Self Care | Payer: Self-pay | Source: Home / Self Care | Attending: Oral Surgery

## 2023-07-04 ENCOUNTER — Encounter (HOSPITAL_COMMUNITY): Payer: Self-pay | Admitting: Oral Surgery

## 2023-07-04 DIAGNOSIS — Z87891 Personal history of nicotine dependence: Secondary | ICD-10-CM | POA: Diagnosis not present

## 2023-07-04 DIAGNOSIS — K029 Dental caries, unspecified: Secondary | ICD-10-CM | POA: Diagnosis not present

## 2023-07-04 DIAGNOSIS — G4733 Obstructive sleep apnea (adult) (pediatric): Secondary | ICD-10-CM | POA: Insufficient documentation

## 2023-07-04 DIAGNOSIS — K0859 Other unsatisfactory restoration of tooth: Secondary | ICD-10-CM | POA: Diagnosis not present

## 2023-07-04 DIAGNOSIS — Z7984 Long term (current) use of oral hypoglycemic drugs: Secondary | ICD-10-CM | POA: Diagnosis not present

## 2023-07-04 DIAGNOSIS — J449 Chronic obstructive pulmonary disease, unspecified: Secondary | ICD-10-CM | POA: Insufficient documentation

## 2023-07-04 DIAGNOSIS — I4891 Unspecified atrial fibrillation: Secondary | ICD-10-CM

## 2023-07-04 DIAGNOSIS — I5032 Chronic diastolic (congestive) heart failure: Secondary | ICD-10-CM

## 2023-07-04 DIAGNOSIS — K0401 Reversible pulpitis: Secondary | ICD-10-CM | POA: Diagnosis not present

## 2023-07-04 DIAGNOSIS — E119 Type 2 diabetes mellitus without complications: Secondary | ICD-10-CM | POA: Insufficient documentation

## 2023-07-04 DIAGNOSIS — I110 Hypertensive heart disease with heart failure: Secondary | ICD-10-CM | POA: Diagnosis not present

## 2023-07-04 HISTORY — DX: Chronic obstructive pulmonary disease, unspecified: J44.9

## 2023-07-04 HISTORY — PX: TOOTH EXTRACTION: SHX859

## 2023-07-04 LAB — CBC
HCT: 40.9 % (ref 36.0–46.0)
Hemoglobin: 14.5 g/dL (ref 12.0–15.0)
MCH: 33.4 pg (ref 26.0–34.0)
MCHC: 35.5 g/dL (ref 30.0–36.0)
MCV: 94.2 fL (ref 80.0–100.0)
Platelets: 148 10*3/uL — ABNORMAL LOW (ref 150–400)
RBC: 4.34 MIL/uL (ref 3.87–5.11)
RDW: 12.3 % (ref 11.5–15.5)
WBC: 5.3 10*3/uL (ref 4.0–10.5)
nRBC: 0 % (ref 0.0–0.2)

## 2023-07-04 LAB — BASIC METABOLIC PANEL
Anion gap: 9 (ref 5–15)
BUN: 21 mg/dL (ref 8–23)
CO2: 19 mmol/L — ABNORMAL LOW (ref 22–32)
Calcium: 9 mg/dL (ref 8.9–10.3)
Chloride: 107 mmol/L (ref 98–111)
Creatinine, Ser: 0.64 mg/dL (ref 0.44–1.00)
GFR, Estimated: >60 mL/min (ref >60)
Glucose, Bld: 253 mg/dL — ABNORMAL HIGH (ref 70–99)
Potassium: 3.8 mmol/L (ref 3.5–5.1)
Sodium: 135 mmol/L (ref 135–145)

## 2023-07-04 LAB — GLUCOSE, CAPILLARY
Glucose-Capillary: 237 mg/dL — ABNORMAL HIGH (ref 70–99)
Glucose-Capillary: 233 mg/dL — ABNORMAL HIGH (ref 70–99)

## 2023-07-04 SURGERY — DENTAL RESTORATION/EXTRACTIONS
Anesthesia: General

## 2023-07-04 MED ORDER — MIDAZOLAM HCL 2 MG/2ML IJ SOLN
INTRAMUSCULAR | Status: AC
  Filled 2023-07-04: qty 2

## 2023-07-04 MED ORDER — LIDOCAINE HCL URETHRAL/MUCOSAL 2 % EX GEL
CUTANEOUS | Status: AC
  Filled 2023-07-04: qty 22

## 2023-07-04 MED ORDER — PROPOFOL 10 MG/ML IV BOLUS
INTRAVENOUS | Status: DC | PRN
  Administered 2023-07-04: 120 mg via INTRAVENOUS

## 2023-07-04 MED ORDER — INSULIN ASPART 100 UNIT/ML IJ SOLN
0.0000 [IU] | INTRAMUSCULAR | Status: DC | PRN
  Administered 2023-07-04: 6 [IU] via SUBCUTANEOUS
  Filled 2023-07-04: qty 1

## 2023-07-04 MED ORDER — ROCURONIUM BROMIDE 10 MG/ML (PF) SYRINGE
PREFILLED_SYRINGE | INTRAVENOUS | Status: DC | PRN
  Administered 2023-07-04: 50 mg via INTRAVENOUS

## 2023-07-04 MED ORDER — LIDOCAINE 2% (20 MG/ML) 5 ML SYRINGE
INTRAMUSCULAR | Status: DC | PRN
  Administered 2023-07-04: 100 mg via INTRAVENOUS

## 2023-07-04 MED ORDER — HYDROCODONE-ACETAMINOPHEN 5-325 MG PO TABS
1.0000 | ORAL_TABLET | ORAL | 0 refills | Status: AC | PRN
  Filled 2023-07-04: qty 18, 3d supply, fill #0

## 2023-07-04 MED ORDER — INSULIN ASPART 100 UNIT/ML IJ SOLN: 0.0000 [IU] | INTRAMUSCULAR | Status: DC | PRN

## 2023-07-04 MED ORDER — OXYCODONE HCL 5 MG PO TABS
ORAL_TABLET | ORAL | Status: AC
  Filled 2023-07-04: qty 1

## 2023-07-04 MED ORDER — LIDOCAINE-EPINEPHRINE 1 %-1:100000 IJ SOLN
INTRAMUSCULAR | Status: AC
  Filled 2023-07-04: qty 1

## 2023-07-04 MED ORDER — OXYMETAZOLINE HCL 0.05 % NA SOLN
NASAL | Status: AC
  Filled 2023-07-04: qty 60

## 2023-07-04 MED ORDER — FENTANYL CITRATE (PF) 250 MCG/5ML IJ SOLN
INTRAMUSCULAR | Status: AC
  Filled 2023-07-04: qty 5

## 2023-07-04 MED ORDER — PROPOFOL 10 MG/ML IV BOLUS
INTRAVENOUS | Status: AC
  Filled 2023-07-04: qty 20

## 2023-07-04 MED ORDER — ALBUTEROL SULFATE HFA 108 (90 BASE) MCG/ACT IN AERS
INHALATION_SPRAY | RESPIRATORY_TRACT | Status: DC | PRN
Start: 2023-07-04 — End: 2023-07-04
  Administered 2023-07-04 (×2): 2 via RESPIRATORY_TRACT

## 2023-07-04 MED ORDER — OXYMETAZOLINE HCL 0.05 % NA SOLN
NASAL | Status: AC
  Filled 2023-07-04: qty 30

## 2023-07-04 MED ORDER — ONDANSETRON HCL 4 MG/2ML IJ SOLN
INTRAMUSCULAR | Status: AC
  Filled 2023-07-04: qty 2

## 2023-07-04 MED ORDER — AMOXICILLIN 500 MG PO CAPS
500.0000 mg | ORAL_CAPSULE | Freq: Three times a day (TID) | ORAL | 0 refills | Status: DC
  Filled 2023-07-04: qty 21, 7d supply, fill #0

## 2023-07-04 MED ORDER — LIDOCAINE-EPINEPHRINE 2 %-1:100000 IJ SOLN
INTRAMUSCULAR | Status: AC
  Filled 2023-07-04: qty 1

## 2023-07-04 MED ORDER — SUGAMMADEX SODIUM 200 MG/2ML IV SOLN
INTRAVENOUS | Status: DC | PRN
  Administered 2023-07-04: 200 mg via INTRAVENOUS

## 2023-07-04 MED ORDER — LACTATED RINGERS IV SOLN: INTRAVENOUS | Status: DC

## 2023-07-04 MED ORDER — ONDANSETRON HCL 4 MG/2ML IJ SOLN: 4.0000 mg | Freq: Once | INTRAMUSCULAR | Status: DC | PRN

## 2023-07-04 MED ORDER — FENTANYL CITRATE (PF) 250 MCG/5ML IJ SOLN
INTRAMUSCULAR | Status: DC | PRN
  Administered 2023-07-04 (×2): 50 ug via INTRAVENOUS

## 2023-07-04 MED ORDER — LIDOCAINE-EPINEPHRINE 2 %-1:100000 IJ SOLN
INTRAMUSCULAR | Status: DC | PRN
  Administered 2023-07-04: 14 mL via INTRADERMAL

## 2023-07-04 MED ORDER — DEXAMETHASONE SODIUM PHOSPHATE 10 MG/ML IJ SOLN
INTRAMUSCULAR | Status: AC
  Filled 2023-07-04: qty 1

## 2023-07-04 MED ORDER — CHLORHEXIDINE GLUCONATE 0.12 % MT SOLN
15.0000 mL | Freq: Once | OROMUCOSAL | Status: AC
  Administered 2023-07-04: 15 mL via OROMUCOSAL
  Filled 2023-07-04: qty 15

## 2023-07-04 MED ORDER — CEFAZOLIN SODIUM-DEXTROSE 2-4 GM/100ML-% IV SOLN
2.0000 g | INTRAVENOUS | Status: AC
  Administered 2023-07-04: 2 g via INTRAVENOUS
  Filled 2023-07-04: qty 100

## 2023-07-04 MED ORDER — OXYCODONE HCL 5 MG PO TABS
5.0000 mg | ORAL_TABLET | Freq: Once | ORAL | Status: AC | PRN
  Administered 2023-07-04: 5 mg via ORAL

## 2023-07-04 MED ORDER — LIDOCAINE 2% (20 MG/ML) 5 ML SYRINGE
INTRAMUSCULAR | Status: AC
  Filled 2023-07-04: qty 5

## 2023-07-04 MED ORDER — ONDANSETRON HCL 4 MG/2ML IJ SOLN
INTRAMUSCULAR | Status: DC | PRN
  Administered 2023-07-04: 4 mg via INTRAVENOUS

## 2023-07-04 MED ORDER — ORAL CARE MOUTH RINSE: 15.0000 mL | Freq: Once | OROMUCOSAL | Status: AC

## 2023-07-04 MED ORDER — MIDAZOLAM HCL 2 MG/2ML IJ SOLN
INTRAMUSCULAR | Status: DC | PRN
  Administered 2023-07-04: 2 mg via INTRAVENOUS

## 2023-07-04 MED ORDER — FENTANYL CITRATE (PF) 100 MCG/2ML IJ SOLN: 25.0000 ug | INTRAMUSCULAR | Status: DC | PRN

## 2023-07-04 MED ORDER — OXYMETAZOLINE HCL 0.05 % NA SOLN
NASAL | Status: DC | PRN
  Administered 2023-07-04: 1 via TOPICAL

## 2023-07-04 MED ORDER — OXYCODONE HCL 5 MG/5ML PO SOLN: 5.0000 mg | Freq: Once | ORAL | Status: AC | PRN

## 2023-07-04 MED ORDER — ROCURONIUM BROMIDE 10 MG/ML (PF) SYRINGE
PREFILLED_SYRINGE | INTRAVENOUS | Status: AC
  Filled 2023-07-04: qty 10

## 2023-07-04 SURGICAL SUPPLY — 36 items
BAG COUNTER SPONGE SURGICOUNT (BAG) IMPLANT
BAG SPNG CNTER NS LX DISP (BAG)
BLADE SURG 15 STRL LF DISP TIS (BLADE) ×1 IMPLANT
BLADE SURG 15 STRL SS (BLADE) ×1
BUR CROSS CUT FISSURE 1.6 (BURR) ×1 IMPLANT
BUR EGG ELITE 4.0 (BURR) ×1 IMPLANT
CANISTER SUCT 3000ML PPV (MISCELLANEOUS) ×1 IMPLANT
COVER SURGICAL LIGHT HANDLE (MISCELLANEOUS) ×1 IMPLANT
GAUZE PACKING FOLDED 2 STR (GAUZE/BANDAGES/DRESSINGS) ×1 IMPLANT
GLOVE BIO SURGEON STRL SZ 6.5 (GLOVE) IMPLANT
GLOVE BIO SURGEON STRL SZ7 (GLOVE) IMPLANT
GLOVE BIO SURGEON STRL SZ8 (GLOVE) ×1 IMPLANT
GLOVE BIOGEL PI IND STRL 6.5 (GLOVE) IMPLANT
GLOVE BIOGEL PI IND STRL 7.0 (GLOVE) IMPLANT
GOWN STRL REUS W/ TWL LRG LVL3 (GOWN DISPOSABLE) ×1 IMPLANT
GOWN STRL REUS W/ TWL XL LVL3 (GOWN DISPOSABLE) ×1 IMPLANT
GOWN STRL REUS W/TWL LRG LVL3 (GOWN DISPOSABLE) ×1
GOWN STRL REUS W/TWL XL LVL3 (GOWN DISPOSABLE) ×1
IV NS 1000ML (IV SOLUTION) ×1
IV NS 1000ML BAXH (IV SOLUTION) ×1 IMPLANT
KIT BASIN OR (CUSTOM PROCEDURE TRAY) ×1 IMPLANT
KIT TURNOVER KIT B (KITS) ×1 IMPLANT
NDL HYPO 25GX1X1/2 BEV (NEEDLE) ×2 IMPLANT
NEEDLE HYPO 25GX1X1/2 BEV (NEEDLE) ×2 IMPLANT
NS IRRIG 1000ML POUR BTL (IV SOLUTION) ×1 IMPLANT
PAD ARMBOARD 7.5X6 YLW CONV (MISCELLANEOUS) ×1 IMPLANT
SLEEVE IRRIGATION ELITE 7 (MISCELLANEOUS) ×1 IMPLANT
SPIKE FLUID TRANSFER (MISCELLANEOUS) ×1 IMPLANT
SPONGE SURGIFOAM ABS GEL 12-7 (HEMOSTASIS) IMPLANT
SPONGE SURGIFOAM ABS GEL SZ50 (HEMOSTASIS) IMPLANT
SUT CHROMIC 3 0 PS 2 (SUTURE) ×1 IMPLANT
SYR BULB IRRIG 60ML STRL (SYRINGE) ×1 IMPLANT
SYR CONTROL 10ML LL (SYRINGE) ×1 IMPLANT
TRAY ENT MC OR (CUSTOM PROCEDURE TRAY) ×1 IMPLANT
TUBING IRRIGATION (MISCELLANEOUS) ×1 IMPLANT
YANKAUER SUCT BULB TIP NO VENT (SUCTIONS) ×1 IMPLANT

## 2023-07-04 NOTE — Op Note (Signed)
07/04/2023  8:16 AM  PATIENT:  Laura [name]  62 y.o. female  PRE-OPERATIVE DIAGNOSIS:  NON RESTORABLE TEETH # 5, 14, 15, 31, 32  POST-OPERATIVE DIAGNOSIS:  SAME  PROCEDURE:  Procedure(s): EXTRACTION TEETH # 5, 14, 15, 31, 32  SURGEON:  Surgeon(s): Ocie Doyne, DMD  ANESTHESIA:   local and general  EBL:  minimal  DRAINS: none   SPECIMEN:  No Specimen  COUNTS:  YES  PLAN OF CARE: Discharge to home after PACU  PATIENT DISPOSITION:  PACU - hemodynamically stable.   PROCEDURE DETAILS: Dictation # [id number]  Georgia Lopes, DMD 07/04/2023 8:16 AM

## 2023-07-04 NOTE — H&P (Signed)
H&P documentation  -History and Physical Reviewed  -Patient has been re-examined  -No change in the plan of care  Laura [name]  

## 2023-07-04 NOTE — Anesthesia Procedure Notes (Addendum)
Procedure Name: Intubation Date/Time: 07/04/2023 7:44 AM  Performed by: Shary Decamp, CRNAPre-anesthesia Checklist: Patient identified, Emergency Drugs available, Suction available and Patient being monitored Patient Re-evaluated:Patient Re-evaluated prior to induction Oxygen Delivery Method: Circle system utilized Preoxygenation: Pre-oxygenation with 100% oxygen Induction Type: IV induction Ventilation: Mask ventilation without difficulty Laryngoscope Size: Mac and 4 Grade View: Grade I Tube type: Oral Rae Tube size: 7.0 mm Number of attempts: 1 Airway Equipment and Method: Oral airway Placement Confirmation: ETT inserted through vocal cords under direct vision, positive ETCO2 and breath sounds checked- equal and bilateral Tube secured with: Tape Dental Injury: Teeth and Oropharynx as per pre-operative assessment  Comments: Intubation by SRNA directed by Dr. Okey Dupre

## 2023-07-04 NOTE — Anesthesia Preprocedure Evaluation (Addendum)
Anesthesia Evaluation  Patient identified by MRN, date of birth, ID band Patient awake    Reviewed: Allergy & Precautions, H&P , NPO status , Patient's Chart, lab work & pertinent test results  Airway Mallampati: II  TM Distance: <3 FB Neck ROM: Full    Dental no notable dental hx.    Pulmonary asthma , sleep apnea and Continuous Positive Airway Pressure Ventilation , COPD, Patient abstained from smoking., former smoker   Pulmonary exam normal breath sounds clear to auscultation       Cardiovascular hypertension, Pt. on medications Normal cardiovascular exam+ dysrhythmias Atrial Fibrillation + pacemaker  Rhythm:Regular Rate:Normal     Neuro/Psych     Bipolar Disorder   TIA   GI/Hepatic negative GI ROS, Neg liver ROS,,,  Endo/Other  diabetes, Type 2Hypothyroidism    Renal/GU negative Renal ROS  negative genitourinary   Musculoskeletal negative musculoskeletal ROS (+)    Abdominal   Peds negative pediatric ROS (+)  Hematology negative hematology ROS (+)   Anesthesia Other Findings Patient only off Eliquis for 48 hours  Reproductive/Obstetrics negative OB ROS                             Anesthesia Physical Anesthesia Plan  ASA: 3  Anesthesia Plan: General   Post-op Pain Management: Minimal or no pain anticipated   Induction: Intravenous  PONV Risk Score and Plan: 3 and Ondansetron, Dexamethasone and Treatment may vary due to age or medical condition  Airway Management Planned: Oral ETT  Additional Equipment:   Intra-op Plan:   Post-operative Plan: Extubation in OR  Informed Consent: I have reviewed the patients History and Physical, chart, labs and discussed the procedure including the risks, benefits and alternatives for the proposed anesthesia with the patient or authorized representative who has indicated his/her understanding and acceptance.     Dental advisory  given  Plan Discussed with: CRNA and Surgeon  Anesthesia Plan Comments:         Anesthesia Quick Evaluation

## 2023-07-04 NOTE — Transfer of Care (Signed)
Immediate Anesthesia Transfer of Care Note  Patient: Laura [name]  Procedure(s) Performed: DENTAL RESTORATION/EXTRACTIONS  Patient Location: PACU  Anesthesia Type:General  Level of Consciousness: awake and patient cooperative  Airway & Oxygen Therapy: Patient Spontanous Breathing and Patient connected to face mask oxygen  Post-op Assessment: Report given to RN and Post -op Vital signs reviewed and stable  Post vital signs: Reviewed and stable  Last Vitals:  Vitals Value Taken Time  BP 155/61 07/04/23 0831  Temp    Pulse 60 07/04/23 0835  Resp 21 07/04/23 0835  SpO2 96 % 07/04/23 0835  Vitals shown include unfiled device data.  Last Pain:  Vitals:   07/04/23 0649  TempSrc:   PainSc: 0-No pain      Patients Stated Pain Goal: 0 (07/04/23 0649)  Complications: No notable events documented.

## 2023-07-04 NOTE — Anesthesia Postprocedure Evaluation (Signed)
Anesthesia Post Note  Patient: Laura [name]  Procedure(s) Performed: DENTAL RESTORATION/EXTRACTIONS     Patient location during evaluation: PACU Anesthesia Type: General Level of consciousness: awake and alert Pain management: pain level controlled Vital Signs Assessment: post-procedure vital signs reviewed and stable Respiratory status: spontaneous breathing, nonlabored ventilation, respiratory function stable and patient connected to nasal cannula oxygen Cardiovascular status: blood pressure returned to baseline and stable Postop Assessment: no apparent nausea or vomiting Anesthetic complications: no  No notable events documented.  Last Vitals:  Vitals:   07/04/23 0845 07/04/23 0900  BP: (!) 147/50 (!) 136/123  Pulse: 64 61  Resp: 12 14  Temp:    SpO2: 97% 96%    Last Pain:  Vitals:   07/04/23 0900  TempSrc:   PainSc: 3                  Tomer Chalmers S

## 2023-07-04 NOTE — Op Note (Signed)
Laura [name], Laura [name] MEDICAL RECORD NO: [id number] ACCOUNT NO: [id number] DATE OF BIRTH: 10/23/1960 FACILITY: [facility] LOCATION: [facility] PHYSICIAN: Georgia Lopes, DDS  Operative Report   DATE OF PROCEDURE: 07/04/2023  PREOPERATIVE DIAGNOSIS:  Nonrestorable teeth numbers 5, 14, 15, 31, 32 secondary to reversible pulpitis.  POSTOPERATIVE DIAGNOSIS:  Nonrestorable teeth numbers 5, 14, 15, 31, 32 secondary to reversible pulpitis.  PROCEDURE:  Extraction teeth numbers 5, 14, 15, 31, 32.  SURGEON:  Georgia Lopes, DDS  ANESTHESIA:  General oral intubation, Dr. Okey Dupre attending.  DESCRIPTION OF PROCEDURE:  The patient was taken to the operating room and placed on the table in supine position.  General anesthesia was administered.  An oral endotracheal tube was placed and secured.  The eyes were protected.  The patient was draped  for surgery.  Timeout was performed.  The posterior pharynx was suctioned and throat pack was placed.  2% lidocaine 1:100,000 epinephrine was infiltrated in a right inferior alveolar block and in buccal and palatal infiltration around the maxillary teeth  to be removed.  A bite block was placed in the left side of the mouth.  Throat pack had been previously placed. The 15 blade was used to make an incision around teeth numbers 31, 32 and 15 in the gingival sulcus.  The periosteum was reflected.  The  teeth were elevated.  Tooth #5 was removed with the dental forceps, the palatal root fractured during removal necessitating use of the [employer] handpiece with fissure bur under irrigation to remove bone around the palatal root tip. The root tip pick was  then used to remove the root tip.  Then, Gelfoam sponge was packed and 3-0 chromic was used to suture after irrigating and the right mandible, the teeth were elevated with 301 elevator and removed from the mouth with the dental forceps.  The sockets were  curetted, irrigated.  Gelfoam sponges were placed and the area was closed  with 3-0 chromic.  Then, then the throat pack was removed and the endotracheal tube was re-taped to the right side of the mouth.  A throat pack was reinserted and then the 15  blade was used to make an incision around teeth numbers 14 and 15. The periosteum was reflected.  The teeth were elevated and removed with the dental forceps.  The sockets were curetted, irrigated.  Gelfoam sponges were placed and then the area was  sutured with 3-0 chromic.  The oral cavity was then irrigated and suctioned.  The throat pack was removed.  The patient was left under the care of anesthesia for extubation and transported to recovery with plans for discharge home through day surgery.  ESTIMATED BLOOD LOSS:  Minimal.  COMPLICATIONS:  None.  SPECIMENS: None.   COUNTS:  Correct.   PAA D: 07/04/2023 8:19:55 am T: 07/04/2023 8:50:00 am  JOB: [id number]/ [id number]

## 2023-07-05 ENCOUNTER — Encounter (HOSPITAL_COMMUNITY): Payer: Self-pay | Admitting: Oral Surgery

## 2023-07-07 DIAGNOSIS — D839 Common variable immunodeficiency, unspecified: Secondary | ICD-10-CM | POA: Diagnosis not present
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
## 2023-07-12 ENCOUNTER — Encounter: Payer: Self-pay | Admitting: "Endocrinology

## 2023-07-12 ENCOUNTER — Encounter: Payer: 59 | Attending: Internal Medicine | Admitting: Nutrition

## 2023-07-12 ENCOUNTER — Telehealth (INDEPENDENT_AMBULATORY_CARE_PROVIDER_SITE_OTHER): Payer: 59 | Admitting: Psychiatry

## 2023-07-12 ENCOUNTER — Encounter: Payer: Self-pay | Admitting: Nutrition

## 2023-07-12 ENCOUNTER — Encounter: Payer: Self-pay | Admitting: Psychiatry

## 2023-07-12 ENCOUNTER — Ambulatory Visit (INDEPENDENT_AMBULATORY_CARE_PROVIDER_SITE_OTHER): Payer: 59 | Admitting: "Endocrinology

## 2023-07-12 VITALS — Ht 64.0 in | Wt 202.0 lb

## 2023-07-12 VITALS — BP 106/54 | HR 56 | Ht 64.0 in | Wt 202.0 lb

## 2023-07-12 DIAGNOSIS — Z79899 Other long term (current) drug therapy: Secondary | ICD-10-CM | POA: Insufficient documentation

## 2023-07-12 DIAGNOSIS — F4310 Post-traumatic stress disorder, unspecified: Secondary | ICD-10-CM | POA: Diagnosis not present

## 2023-07-12 DIAGNOSIS — Z794 Long term (current) use of insulin: Secondary | ICD-10-CM

## 2023-07-12 DIAGNOSIS — E039 Hypothyroidism, unspecified: Secondary | ICD-10-CM | POA: Insufficient documentation

## 2023-07-12 DIAGNOSIS — E1165 Type 2 diabetes mellitus with hyperglycemia: Secondary | ICD-10-CM

## 2023-07-12 DIAGNOSIS — Z713 Dietary counseling and surveillance: Secondary | ICD-10-CM | POA: Diagnosis not present

## 2023-07-12 DIAGNOSIS — E559 Vitamin D deficiency, unspecified: Secondary | ICD-10-CM | POA: Diagnosis not present

## 2023-07-12 DIAGNOSIS — F3341 Major depressive disorder, recurrent, in partial remission: Secondary | ICD-10-CM

## 2023-07-12 DIAGNOSIS — E66812 Obesity, class 2: Secondary | ICD-10-CM

## 2023-07-12 DIAGNOSIS — I10 Essential (primary) hypertension: Secondary | ICD-10-CM

## 2023-07-12 DIAGNOSIS — E782 Mixed hyperlipidemia: Secondary | ICD-10-CM

## 2023-07-12 DIAGNOSIS — E669 Obesity, unspecified: Secondary | ICD-10-CM

## 2023-07-12 DIAGNOSIS — Z6835 Body mass index (BMI) 35.0-35.9, adult: Secondary | ICD-10-CM

## 2023-07-12 LAB — POCT GLYCOSYLATED HEMOGLOBIN (HGB A1C): HbA1c, POC (controlled diabetic range): 7 % (ref 0.0–7.0)

## 2023-07-12 MED ORDER — EMPAGLIFLOZIN 10 MG PO TABS: 10.0000 mg | ORAL_TABLET | Freq: Every day | ORAL | 1 refills | Status: DC

## 2023-07-12 NOTE — Patient Instructions (Signed)
Continue Depakote 500 mg in AM, 1000 mg at night  Continue sertraline 25 mg at night  Obtain  lab- depakote at [facility] Next appointment: 12/20 at 11 30

## 2023-07-12 NOTE — Progress Notes (Signed)
07/12/2023, 12:01 PM   Endocrinology follow-up note   Subjective:    Patient ID: Laura [name], female    DOB: Apr 26, 1961.  Laura [name] was seen since 2019 for management of diabetes, also seen during her last visit in relation to her steroid exposure to rule out adrenal insufficiency.  PMD: Benita Stabile, MD.   Past Medical History:  Diagnosis Date   Anemia    Aortic atherosclerosis (HCC)    Asthma    Atrial fibrillation and flutter (HCC)    a.) CHA2DS2VASc = 6 (sex, HTN, TIA x2, vascular disease history, T2DM);  b.) DCCV ~ 2009; c.) DCCV 08/29/2018 (200J x1 --> SR with 1st degree AVB); d.) rate/rhythm maintained on oral diltiazem + bisoprolol; chronically anticoagulated with apixaban   Bipolar disorder (HCC)    Carpal tunnel syndrome of right wrist    CHB (complete heart block) (HCC)    a.) s/p St. Jude dual chamber PPM placement   CHF (congestive heart failure) (HCC)    Complication of anesthesia    a.) "allergic reaction" to perioperative analgesics + anesthetic medications in [place]; unsure of combination; not able to recount reactions/events --> states "I ended up in the ICU for 5 days"   Constipation    a.) on linaclotide   COPD (chronic obstructive pulmonary disease) (HCC)    DDD (degenerative disc disease), thoracic    GERD (gastroesophageal reflux disease)    H/O congenital atrial septal defect (ASD) repair    Hallux valgus of right foot    Hammertoe of second toe of right foot    History of 2019 novel coronavirus disease (COVID-19)    a.) 10/2020; b.) 05/2021   HLD (hyperlipidemia)    HTN (hypertension)    Hypogammaglobulinemia (HCC)    a.) on Curitru   Hypothyroid    IgE deficiency (HCC)    Long term current use of anticoagulant    a.) apixaban   Lumbar stenosis    Migraines    Mitral stenosis 02/08/2022   a.) TTE 02/08/2022: EF 60-65%, mild MAC, mild MV stenosis (peak  grad 14.6 / mean grad 4.0)   Neuropathy    NSVT (nonsustained ventricular tachycardia) (HCC) 10/31/2018   a.) Holter 10/31/2018: 6 beat run   Obesity    OSA on CPAP    Osteoarthritis    Presence of permanent cardiac 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 Jude dual chamber device; placed for symptomatic bradycardia secondary to intermittent CHB   PTSD (post-traumatic stress disorder)    Pulmonary nodules    Recurrent sinusitis    Short-term memory loss    Sleep difficulties    a.) takes melatonin   Symptomatic bradycardia    a.) s/p St. Jude dual chamber PPM placement   Syncope    TIA (transient ischemic attack)    x3-last one in 2015   Tremors of nervous system    Type 2 diabetes mellitus (HCC)    Past Surgical History:  Procedure Laterality Date   ASD REPAIR  1968   BIOPSY  01/26/2021   Procedure: BIOPSY;  Surgeon: Lanelle Bal, DO;  Location: [facility];  Service: Endoscopy;;  gastric  CARDIOVERSION  2009   CARDIOVERSION N/A 08/29/2018   Procedure: CARDIOVERSION;  Surgeon: Antoine Poche, MD;  Location: [facility];  Service: Endoscopy;  Laterality: N/A;   CARPAL TUNNEL RELEASE Bilateral    CESAREAN SECTION  1986, 1988, 1994   COLONOSCOPY WITH PROPOFOL N/A 01/26/2021   Procedure: COLONOSCOPY WITH PROPOFOL;  Surgeon: Lanelle Bal, DO;  Location: [facility];  Service: Endoscopy;  Laterality: N/A;  am appt, diabetic   ESOPHAGOGASTRODUODENOSCOPY (EGD) WITH PROPOFOL N/A 01/26/2021   Procedure: ESOPHAGOGASTRODUODENOSCOPY (EGD) WITH PROPOFOL;  Surgeon: Lanelle Bal, DO;  [facility]: [facility];  Service: Endoscopy;  Laterality: N/A;   HALLUX VALGUS [place]USTIN Right 07/29/2022   Procedure: HALLUX VALGUS;  Surgeon: Edwin Cap, DPM;  Location: [facility];  Service: Podiatry;  Laterality: Right;   HAMMER TOE SURGERY Right 07/29/2022   Procedure: HAMMER TOE CORRECTION 2ND;  Surgeon: Edwin Cap, DPM;  Location: [facility];  Service: Podiatry;  Laterality: Right;    LEFT HEART CATH AND CORONARY ANGIOGRAPHY N/A 08/30/2019   Procedure: LEFT HEART CATH AND CORONARY ANGIOGRAPHY;  Surgeon: Corky Crafts, MD;  Location: [facility] INVASIVE CV LAB;  Service: Cardiovascular;  Laterality: N/A;   NASAL SINUS SURGERY  2017   PACEMAKER IMPLANT N/A 03/03/2022   Procedure: PACEMAKER IMPLANT;  Surgeon: Marinus Maw, MD;  Location: [facility]VAS[facility] CV LAB;  Service: Cardiovascular;  Laterality: N/A;   POLYPECTOMY  01/26/2021   Procedure: POLYPECTOMY;  Surgeon: Lanelle Bal, DO;  Location: [facility];  Service: Endoscopy;;  colon   TOOTH EXTRACTION N/A 07/04/2023   Procedure: DENTAL RESTORATION/EXTRACTIONS;  Surgeon: Ocie Doyne, DMD;  Location: [facility];  Service: Oral Surgery;  Laterality: N/A;   Social History   Socioeconomic History   Marital status: Widowed    Spouse name: Not on file   Number of children: 3   Years of education: 10   Highest education level: Not on file  Occupational History   Not on file  Tobacco Use   Smoking status: Former    Current packs/day: 0.00    Average packs/day: 1 pack/day for 28.0 years (28.0 ttl pk-yrs)    Types: Cigarettes    Start date: 79    Quit date: 2008    Years since quitting: 16.7   Smokeless tobacco: Never  Vaping Use   Vaping status: Never Used  Substance and Sexual Activity   Alcohol use: Not Currently   Drug use: Not Currently   Sexual activity: Not Currently    Birth control/protection: Post-menopausal  Other Topics Concern   Not on file  Social History Narrative   Right handed   Drinks caffeine   One story home   Social Determinants of Health   Financial Resource Strain: High Risk (01/06/2022)   Overall Financial Resource Strain (CARDIA)    Difficulty of Paying Living Expenses: Hard  Food Insecurity: No Food Insecurity (01/06/2022)   Hunger Vital Sign    Worried About Running Out of Food in the Last Year: Never true    Ran Out of Food in the Last Year: Never true  Transportation Needs:  No Transportation Needs (01/06/2022)   PRAPARE - Administrator, Civil Service (Medical): No    Lack of Transportation (Non-Medical): No  Physical Activity: Insufficiently Active (01/06/2022)   Exercise Vital Sign    Days of Exercise per Week: 4 days    Minutes of Exercise per Session: 30 min  Stress: No Stress Concern Present (01/06/2022)  [employer] of Occupational Health - Occupational Stress Questionnaire    Feeling of Stress : Only a little  Social Connections: Moderately Integrated (01/06/2022)   Social Connection and Isolation Panel [NHANES]    Frequency of Communication with Friends and Family: More than three times a week    Frequency of Social Gatherings with Friends and Family: More than three times a week    Attends Religious Services: More than 4 times per year    Active Member of [employer] or Organizations: Yes    Attends Banker Meetings: More than 4 times per year    Marital Status: Widowed   Outpatient Encounter Medications as of 07/12/2023  Medication Sig   empagliflozin (JARDIANCE) 10 MG TABS tablet Take 1 tablet (10 mg total) by mouth daily before breakfast.   albuterol (PROVENTIL) (2.5 MG/3ML) 0.083% nebulizer solution Take 3 mLs (2.5 mg total) by nebulization every 4 (four) hours as needed for wheezing or shortness of breath.   albuterol (VENTOLIN HFA) 108 (90 Base) MCG/ACT inhaler INHALE 2 PUFFS INTO THE LUNGS EVERY 6 HOURS AS NEEDED FOR WHEEZING OR SHORTNESS OF BREATH   atorvastatin (LIPITOR) 20 MG tablet Take 20 mg by mouth at bedtime.   Azelastine HCl 137 MCG/SPRAY SOLN Place 1 spray into both nostrils daily.   BD PEN NEEDLE NANO 2ND GEN 32G X 4 MM MISC 1 each by Other route in the morning, at noon, in the evening, and at bedtime.   bisoprolol (ZEBETA) 10 MG tablet TAKE ONE TABLET BY MOUTH DAILY AT 9AM   blood glucose meter kit and supplies 1 each by Other route 4 (four) times daily. Dispense based on patient and insurance preference. Use  up to four times daily as directed. (FOR ICD-10 E10.9, E11.9).   Cholecalciferol (VITAMIN D3) 125 MCG (5000 UT) CAPS Take 1 capsule (5,000 Units total) by mouth daily.   Continuous Glucose Sensor (FREESTYLE LIBRE 3 SENSOR) MISC 1 Piece by Does not apply route every 14 (fourteen) days. Place 1 sensor on the skin every 14 days. Use to check glucose continuously   diltiazem (CARDIZEM CD) 300 MG 24 hr capsule TAKE ONE CAPSULE BY MOUTH DAILY AT 9AM   divalproex (DEPAKOTE ER) 500 MG 24 hr tablet Take 1 tablet (500 mg total) by mouth daily AND 2 tablets (1,000 mg total) at bedtime.   Dulaglutide (TRULICITY) 1.5 MG/0.5ML SOPN Inject 1.5 mg into the skin once a week. (Patient taking differently: Inject 4.5 mg into the skin every Sunday.)   ELDERBERRY PO Take 4 g by mouth daily. Gummies 2g each   ELIQUIS 5 MG TABS tablet TAKE ONE TABLET BY MOUTH TWICE DAILY @ 9AM & 5PM   EPINEPHrine 0.3 mg/0.3 mL IJ SOAJ injection Inject 0.3 mg into the muscle once as needed for anaphylaxis.   FASENRA PEN 30 MG/ML SOAJ INJECT 30MG  SUBCUTANEOUSLY EVERY 8 WEEKS   FERROUS SULFATE PO Take 65 mg by mouth every evening.   fluticasone furoate-vilanterol (BREO ELLIPTA) 100-25 MCG/ACT AEPB INHALE 1 PUFF INTO LUNGS DAILY   gabapentin (NEURONTIN) 600 MG tablet Take 600 mg by mouth 3 (three) times daily.   hydrOXYzine (ATARAX) 25 MG tablet TAKE ONE TABLET BY MOUTH THREE TIMES DAILY AS NEEDED FOR ANXIETY OR FOR ITCHING   Immune Globulin, Human, (CUVITRU [place]) Inject 27 mg into the skin every 14 (fourteen) days.   insulin glargine (LANTUS SOLOSTAR) 100 UNIT/ML Solostar Pen Inject 30 Units into the skin at bedtime.   Insulin Pen Needle (PEN NEEDLES  3/16") 31G X 5 MM MISC Use as directed with insulin pen   levalbuterol (XOPENEX HFA) 45 MCG/ACT inhaler INHALE TWO PUFFS INTO LUNGS EVERY 6 HOURS AS NEEDED FOR WHEEZING (BULK)   levalbuterol (XOPENEX) 1.25 MG/3ML nebulizer solution Inhale 1.25 mg into the lungs daily as needed (asthma).    levothyroxine (SYNTHROID, LEVOTHROID) 112 MCG tablet Take 112 mcg by mouth daily before breakfast.   lidocaine (LIDODERM) 5 % Place 1 patch onto the skin daily. Remove & Discard patch within 12 hours or as directed by MD (Patient not taking: Reported on 06/22/2023)   linaclotide (LINZESS) 145 MCG CAPS capsule Take 1 capsule (145 mcg total) by mouth daily before breakfast.   losartan (COZAAR) 50 MG tablet Take 50 mg by mouth every morning.   MELATONIN GUMMIES PO Take 10 mg by mouth at bedtime.   metFORMIN (GLUCOPHAGE) 500 MG tablet Take 500 mg by mouth 2 (two) times daily.   montelukast (SINGULAIR) 10 MG tablet Take 10 mg by mouth at bedtime.   Multiple Vitamins-Minerals (HAIR SKIN & NAILS PO) Take 1 tablet by mouth daily.   Multiple Vitamins-Minerals (MULTIVITAMIN WITH MINERALS) tablet Take 1 tablet by mouth daily. Woman   Nebulizer MISC Nebulizer tubing kit   omeprazole (PRILOSEC) 20 MG capsule TAKE ONE CAPSULE BY MOUTH TWICE DAILY @ 9AM & 5PM BEOFRE MEALS (Patient taking differently: Take 20 mg by mouth daily.)   ondansetron (ZOFRAN-ODT) 8 MG disintegrating tablet Take 8 mg by mouth daily as needed for vomiting or nausea.   ONETOUCH ULTRA test strip 1 each by Other route in the morning, at noon, in the evening, and at bedtime.   polyethylene glycol (MIRALAX / GLYCOLAX) 17 g packet Take 17 g by mouth daily as needed. (Patient taking differently: Take 17 g by mouth daily as needed for moderate constipation or severe constipation.)   Potassium Chloride ER 20 MEQ TBCR Take 20 mEq by mouth daily.   sertraline (ZOLOFT) 25 MG tablet Take 1 tablet (25 mg total) by mouth at bedtime.   Spacer/Aero-Holding Chambers (AEROCHAMBER PLUS) inhaler 1 each by Other route as needed for other. Use as instructed   topiramate (TOPAMAX) 100 MG tablet Take 1 tablet (100 mg total) by mouth 2 (two) times daily.   topiramate (TOPAMAX) 50 MG tablet TAKE ONE TABLET (50MG  TOTAL) BY MOUTH DAILY (Patient taking differently: Take  50 mg by mouth See admin instructions. Take with 100 mg in the morning for a total of 150 mg in the morning)   torsemide (DEMADEX) 20 MG tablet Take 20 mg by mouth daily as needed (Fluid).   [DISCONTINUED] amoxicillin (AMOXIL) 500 MG capsule Take 1 capsule (500 mg total) by mouth 3 (three) times daily.   [DISCONTINUED] Continuous Glucose Receiver (DEXCOM G7 RECEIVER) DEVI Use to check glucose continuously as directed   [DISCONTINUED] Continuous Glucose Sensor (DEXCOM G7 SENSOR) MISC Use to check glucose continuously. Change sensor every 10 days.   Facility-Administered Encounter Medications as of 07/12/2023  Medication   Benralizumab SOSY 30 mg    ALLERGIES: Allergies  Allergen Reactions   Ativan [Lorazepam] Hives   Phenergan [Promethazine Hcl] Hives    VACCINATION STATUS: Immunization History  Administered Date(s) Administered   Influenza Split 08/24/2018   Influenza Whole 07/15/2019   Influenza,inj,Quad PF,6+ Mos 07/10/2017, 10/27/2021   Moderna Sars-Covid-2 Vaccination 12/31/2019, 01/30/2020   Pneumococcal-Unspecified 10/10/2012    Diabetes She presents for her follow-up diabetic visit. She has type 2 diabetes mellitus. Onset time: She was diagnosed at approximate  age of 31 years, stated by gestational diabetes with all 3 of her pregnancies in her 47s. Her disease course has been improving (Since her last visit, she fell and injured herself.  CT scan of the head did not show any intracranial bleeding.Marland Kitchen). There are no hypoglycemic associated symptoms. Pertinent negatives for hypoglycemia include no confusion, headaches, pallor or seizures. Pertinent negatives for diabetes include no blurred vision, no chest pain, no fatigue, no polydipsia, no polyphagia and no polyuria. There are no hypoglycemic complications. Symptoms are worsening. Diabetic complications include a CVA and heart disease. (She has history of open heart surgery for ASD in 1965.) Risk factors for coronary artery disease  include dyslipidemia, diabetes mellitus, family history, obesity, hypertension, post-menopausal, sedentary lifestyle and tobacco exposure. Her weight is fluctuating minimally. She is following a generally unhealthy diet. When asked about meal planning, she reported none. She has not had a previous visit with a dietitian. She rarely (She has medical problem with bronchiectasis for which she gets treated with steroids on and off.) participates in exercise. Her home blood glucose trend is decreasing steadily. Her breakfast blood glucose range is generally 140-180 mg/dl. Her lunch blood glucose range is generally 140-180 mg/dl. Her dinner blood glucose range is generally 140-180 mg/dl. Her bedtime blood glucose range is generally 140-180 mg/dl. Her overall blood glucose range is 140-180 mg/dl. (Laura [name] presents with improved glycemic profile.  Her CGM was analyzed and AGP report shows 59% time in range, 30% level 1 hyperglycemia, 11% level 2 hyperglycemia.  Her point-of-care A1c is 7%, progressively improving.  She did not document or report significant hypoglycemia.   ) An ACE inhibitor/angiotensin II receptor blocker is being taken.  Hyperlipidemia This is a chronic problem. The current episode started more than 1 year ago. Exacerbating diseases include diabetes and obesity. Associated symptoms include myalgias. Pertinent negatives include no chest pain or shortness of breath. Current antihyperlipidemic treatment includes statins. Risk factors for coronary artery disease include dyslipidemia, diabetes mellitus, family history, obesity, hypertension, a sedentary lifestyle and post-menopausal.  Hypertension This is a chronic problem. The current episode started more than 1 year ago. The problem is uncontrolled. Pertinent negatives include no blurred vision, chest pain, headaches, palpitations or shortness of breath. Risk factors for coronary artery disease include dyslipidemia, diabetes mellitus, obesity,  sedentary lifestyle and smoking/tobacco exposure. Past treatments include ACE inhibitors. Hypertensive end-organ damage includes CVA.  Other The current episode started more than 1 month ago. The problem occurs constantly. Progression since onset: Patient reports that she did have exposure to steroids related to back pain on 2 separate occasions between November 2000 21 and 2022.  Last exposure was related to her COVID infection.  She reports that she developed fatigueced that she developed fatigue. Associated symptoms include myalgias. Pertinent negatives include no arthralgias, chest pain, chills, coughing, fatigue, fever, headaches, nausea, [name] or vomiting. She has tried nothing for the symptoms. The treatment provided no relief.     Objective:    BP (!) 106/54   Pulse (!) 56   Ht 5\' 4"  (1.626 m)   Wt 202 lb (91.6 kg)   BMI 34.67 kg/m   Wt Readings from Last 3 Encounters:  07/12/23 202 lb (91.6 kg)  07/12/23 202 lb (91.6 kg)  07/04/23 206 lb (93.4 kg)         Latest Ref Rng & Units 07/04/2023    6:09 AM 03/03/2023   11:21 AM 12/01/2022   12:00 AM  CMP  Glucose 70 - 99  mg/dL 259  563    BUN 8 - 23 mg/dL 21  15  23       Creatinine 0.44 - 1.00 mg/dL 8.75  6.43  0.7      Sodium 135 - 145 mmol/L 135  141  139      Potassium 3.5 - 5.1 mmol/L 3.8  4.6  4.5      Chloride 98 - 111 mmol/L 107  107  103      CO2 22 - 32 mmol/L 19  21  19       Calcium 8.9 - 10.3 mg/dL 9.0  9.2  9.5      Total Protein 6.0 - 8.5 g/dL 6.0 - 8.5 g/dL  7.2    7.1    Total Bilirubin 0.0 - 1.2 mg/dL 0.0 - 1.2 mg/dL  0.2    0.2    Alkaline Phos 44 - 121 IU/L 44 - 121 IU/L  85    83  92      AST 0 - 40 IU/L 0 - 40 IU/L  9    11  14       ALT 0 - 32 IU/L 0 - 32 IU/L  10    10  10          This result is from an external source.   Multiple values from one day are sorted in reverse-chronological order   Lipid Panel     Component Value Date/Time   CHOL 157 03/03/2023 1121   TRIG 115 03/03/2023 1121    HDL 44 03/03/2023 1121   CHOLHDL 3.6 03/03/2023 1121   CHOLHDL 3.1 05/20/2021 1048   VLDL 22 05/20/2021 1048   LDLCALC 92 03/03/2023 1121   LABVLDL 21 03/03/2023 1121    Latest Reference Range & Units 07/27/22 16:18 12/01/22 00:00 03/03/23 11:21  TSH 0.450 - 4.500 uIU/mL 1.83 1.84 (E) 1.180  T4,Free(Direct) 0.82 - 1.77 ng/dL   3.29  (E): External lab result   Assessment & Plan:  1. DM type 2 causing vascular disease (HCC)  Laura [name] presents with improved glycemic profile.  Her CGM was analyzed and AGP report shows 59% time in range, 30% level 1 hyperglycemia, 11% level 2 hyperglycemia.  Her point-of-care A1c is 7%, progressively improving.  She did not document or report significant hypoglycemia.  -her diabetes is complicated by CVA, CHF, history of open heart surgery for ASD, obesity/sedentary life, history of chronic smoking with bronchiectasis and Laura [name] remains at a high risk for more acute and chronic complications which include CAD, CVA, CKD, retinopathy, and neuropathy. These are all discussed in detail with the patient.  - I have counseled her on diet management and weight loss, by adopting a carbohydrate restricted/protein rich diet.  - she acknowledges that there is a room for improvement in her food and drink choices. - Suggestion is made for her to avoid simple carbohydrates  from her diet including Cakes, Sweet Desserts, Ice Cream, Soda (diet and regular), Sweet Tea, Candies, Chips, Cookies, Store Bought Juices, Alcohol , Artificial Sweeteners,  Coffee Creamer, and "Sugar-free" Products, Lemonade. This will help patient to have more stable blood glucose profile and potentially avoid unintended weight gain.  The following Lifestyle Medicine recommendations according to American College of Lifestyle Medicine  [facility]) were discussed and and offered to patient and she  agrees to start the journey:  A. Whole Foods, Plant-Based Nutrition comprising of fruits and  vegetables, plant-based proteins, whole-grain carbohydrates was discussed in detail with the  patient.   A list for source of those nutrients were also provided to the patient.  Patient will use only water or unsweetened tea for hydration. B.  The need to stay away from risky substances including alcohol, smoking; obtaining 7 to 9 hours of restorative sleep, at least 150 minutes of moderate intensity exercise weekly, the importance of healthy social connections,  and stress management techniques were discussed. C.  A full color page of  Calorie density of various food groups per pound showing examples of each food groups was provided to the patient.   - she is advised to stick to a routine mealtimes to eat 3 meals  a day and avoid unnecessary snacks ( to snack only to correct hypoglycemia).   - she is following  with Norm Salt, RDN, CDE for individualized diabetes education.  - I have approached her with the following individualized plan to manage diabetes and patient agrees:   -In the interest of avoiding further escalation on insulin, she is offered treatment option with SGLT2 inhibitors.  After discussing side effects and precautions I prescribed Jardiance 10 mg p.o. daily at breakfast.    -In the meantime, she is advised to continue Lantus at a lower dose of 30 units nightly, advised to continue to utilize her CGM continuously.   -She is advised to continue Trulicity 4.5 mg subcutaneously weekly.  She is also on metformin 500 mg p.o. twice daily.  She is advised to call clinic for blood glucose readings above 200 mg per DL x3, or less than 70 mg per DL.     - Patient specific target  A1c;  LDL, HDL, Triglycerides,  were discussed in detail.  2.  BP/HTN:  -Her blood pressure is controlled to target.  She advised to continue her current blood pressure medications including hydralazine 25 mg 3 times daily.    3) Lipids/HPL: Her most recent lipid panel showed uncontrolled LDL at 92.  She  is advised to continue atorvastatin 10 mg p.o. nightly.    Side effects and precautions discussed with her .  4)  Weight/Diet: She presents with weight loss, her BMI today is 34.67 improving from  37.39--still clearly complicating her diabetes care.  She is a candidate for modest weight loss.   The above discussed dietary plan will help achieve weight loss. -She is working with her dietitian.     5 hypothyroidism-long-standing, etiology unclear, she has multiple family members with thyroid dysfunctions. Her previsit thyroid function tests are consistent with appropriate replacement.  She is advised to continue levothyroxine 112 mcg p.o. daily before breakfast.      - We discussed about the correct intake of her thyroid hormone, on empty stomach at fasting, with water, separated by at least 30 minutes from breakfast and other medications,  and separated by more than 4 hours from calcium, iron, multivitamins, acid reflux medications (PPIs). -Patient is made aware of the fact that thyroid hormone replacement is needed for life, dose to be adjusted by periodic monitoring of thyroid function tests.   6) vitamin D deficiency: - She is currently on  vitamin D supplements including vitamin D3 5000 units daily.     7) Chronic Care/Health Maintenance:  -she  is on  Statin medications and  is encouraged to continue to follow up with Ophthalmology, Dentist,  Podiatrist at least yearly or according to recommendations, and advised to  stay away from smoking. I have recommended yearly flu vaccine and pneumonia vaccination at least every 5  years;  and  sleep for at least 7 hours a day.  - I advised patient to maintain close follow up with Benita Stabile, MD for primary care needs.    I spent  41  minutes in the care of the patient today including review of labs from CMP, Lipids, Thyroid Function, Hematology (current and previous including abstractions from other facilities); face-to-face time discussing   her blood glucose readings/logs, discussing hypoglycemia and hyperglycemia episodes and symptoms, medications doses, her options of short and long term treatment based on the latest standards of care / guidelines;  discussion about incorporating lifestyle medicine;  and documenting the encounter. Risk reduction counseling performed per USPSTF guidelines to reduce  obesity and cardiovascular risk factors.     Please refer to Patient Instructions for Blood Glucose Monitoring and Insulin/Medications Dosing Guide"  in media tab for additional information. Please  also refer to " Patient Self Inventory" in the Media  tab for reviewed elements of pertinent patient history.  Laura [name] participated in the discussions, expressed understanding, and voiced agreement with the above plans.  All questions were answered to her satisfaction. she is encouraged to contact clinic should she have any questions or concerns prior to her return visit.    Follow up plan: - Return in about 4 months (around 11/12/2023).  Marquis Lunch, MD [facility] [address] [place], [place] [address] Phone: [phone]  Fax: [phone]    07/12/2023, 12:01 PM  This note was partially dictated with voice recognition software. Similar sounding words can be transcribed inadequately or may not  be corrected upon review.

## 2023-07-12 NOTE — Progress Notes (Signed)
Medical Nutrition Therapy  Appointment Start time:  0900  Appointment End time:0930  NUTRITION ASSESSMENT  Follow up DM Type 2    She notes her blood sugars are better and sometimes dropping. Has Libre.  Average Glucose 150 mg/dL Glucose Management Indicator (GMI) 6.9% Glucose Variability 18.7%  Working on eating more whole plant based foods. Doing much better.   Going to start going to [facility] weekly.  Anthropometrics  Wt Readings from Last 3 Encounters:  08/02/23 202 lb 12.8 oz (92 kg)  07/12/23 202 lb (91.6 kg)  07/12/23 202 lb (91.6 kg)   Ht Readings from Last 3 Encounters:  08/02/23 5\' 4"  (1.626 m)  07/12/23 5\' 4"  (1.626 m)  07/12/23 5\' 4"  (1.626 m)   Body mass index is 34.67 kg/m. @BMIFA @ Facility age limit for growth %iles is 20 years. Facility age limit for growth %iles is 20 years. Clinical Medical Hx: CHF and DM,  Medications: Lantus 50 units a day now, Trulicity weekly and Metformin.  Labs:  Lab Results  Component Value Date   HGBA1C 7.8 (A) 03/08/2023      Latest Ref Rng & Units 07/04/2023    6:09 AM 03/03/2023   11:21 AM 12/01/2022   12:00 AM  CMP  Glucose 70 - 99 mg/dL 161  096    BUN 8 - 23 mg/dL 21  15  23       Creatinine 0.44 - 1.00 mg/dL 0.45  4.09  0.7      Sodium 135 - 145 mmol/L 135  141  139      Potassium 3.5 - 5.1 mmol/L 3.8  4.6  4.5      Chloride 98 - 111 mmol/L 107  107  103      CO2 22 - 32 mmol/L 19  21  19       Calcium 8.9 - 10.3 mg/dL 9.0  9.2  9.5      Total Protein 6.0 - 8.5 g/dL 6.0 - 8.5 g/dL  7.2    7.1    Total Bilirubin 0.0 - 1.2 mg/dL 0.0 - 1.2 mg/dL  0.2    0.2    Alkaline Phos 44 - 121 IU/L 44 - 121 IU/L  85    83  92      AST 0 - 40 IU/L 0 - 40 IU/L  9    11  14       ALT 0 - 32 IU/L 0 - 32 IU/L  10    10  10          This result is from an external source.   Multiple values from one day are sorted in reverse-chronological order   Notable Signs/Symptoms:    Lifestyle & Dietary  Lives with her  sister.   Estimated daily fluid intake:  64 oz   Working on eating meals on time.   Sleep: varies  Stress / self-care: stress eater Current average weekly physical activity: ADL     Estimated Energy Needs Calories: 1200 Carbohydrate: 135g Protein: 90g Fat: 33g   NUTRITION DIAGNOSIS  NB-1.1 Food and nutrition-related knowledge deficit As related to Diabetes Type 2.  As evidenced by A1C 7.7%.   NUTRITION INTERVENTION  Nutrition education (E-1) on the following topics:  Lifestyle Medicine - Whole Food, Plant Predominant Nutrition is highly recommended: Eat Plenty of vegetables, Mushrooms, fruits, Legumes, Whole Grains, Nuts, seeds in lieu of processed meats, processed snacks/pastries red meat, poultry, eggs.    -It is better to avoid simple  carbohydrates including: Cakes, Sweet Desserts, Ice Cream, Soda (diet and regular), Sweet Tea, Candies, Chips, Cookies, Store Bought Juices, Alcohol in Excess of  1-2 drinks a day, Lemonade,  Artificial Sweeteners, Doughnuts, Coffee Creamers, "Sugar-free" Products, etc, etc.  This is not a complete list.....  Exercise: If you are able: 30 -60 minutes a day ,4 days a week, or 150 minutes a week.  The longer the better.  Combine stretch, strength, and aerobic activities.  If you were told in the past that you have high risk for cardiovascular diseases, you may seek evaluation by your heart doctor prior to initiating moderate to intense exercise programs.  Handouts Provided Include  .Nutrition Lifestyle handout  Learning Style & Readiness for Change Teaching method utilized: Visual & Auditory  Demonstrated degree of understanding via: Teach Back  Barriers to learning/adherence to lifestyle change: none  Goals Established by Pt  Keep up the great job! Eat 34 g CHO at all meals Don't skip meals. Keep working out.   MONITORING & EVALUATION Dietary intake, weekly physical activity, and blood sugars in 3 month.   Next Steps  Get back on  track with meal planning and eating meals on time-more plant based foods.

## 2023-07-12 NOTE — Patient Instructions (Signed)
                                     Advice for Weight Management  -For most of us the best way to lose weight is by diet management. Generally speaking, diet management means consuming less calories intentionally which over time brings about progressive weight loss.  This can be achieved more effectively by avoiding ultra processed carbohydrates, processed meats, unhealthy fats.    It is critically important to know your numbers: how much calorie you are consuming and how much calorie you need. More importantly, our carbohydrates sources should be unprocessed naturally occurring  complex starch food items.  It is always important to balance nutrition also by  appropriate intake of proteins (mainly plant-based), healthy fats/oils, plenty of fruits and vegetables.   -The American College of Lifestyle Medicine (ACL M) recommends nutrition derived mostly from Whole Food, Plant Predominant Sources example an apple instead of applesauce or apple pie. Eat Plenty of vegetables, Mushrooms, fruits, Legumes, Whole Grains, Nuts, seeds in lieu of processed meats, processed snacks/pastries red meat, poultry, eggs.  Use only water or unsweetened tea for hydration.  The College also recommends the need to stay away from risky substances including alcohol, smoking; obtaining 7-9 hours of restorative sleep, at least 150 minutes of moderate intensity exercise weekly, importance of healthy social connections, and being mindful of stress and seek help when it is overwhelming.    -Sticking to a routine mealtime to eat 3 meals a day and avoiding unnecessary snacks is shown to have a big role in weight control. Under normal circumstances, the only time we burn stored energy is when we are hungry, so allow  some hunger to take place- hunger means no food between appropriate meal times, only water.  It is not advisable to starve.   -It is better to avoid simple carbohydrates including:  Cakes, Sweet Desserts, Ice Cream, Soda (diet and regular), Sweet Tea, Candies, Chips, Cookies, Store Bought Juices, Alcohol in Excess of  1-2 drinks a day, Lemonade,  Artificial Sweeteners, Doughnuts, Coffee Creamers, "Sugar-free" Products, etc, etc.  This is not a complete list.....    -Consulting with certified diabetes educators is proven to provide you with the most accurate and current information on diet.  Also, you may be  interested in discussing diet options/exchanges , we can schedule a visit with Laura [name], RDN, CDE for individualized nutrition education.  -Exercise: If you are able: 30 -60 minutes a day ,4 days a week, or 150 minutes of moderate intensity exercise weekly.    The longer the better if tolerated.  Combine stretch, strength, and aerobic activities.  If you were told in the past that you have high risk for cardiovascular diseases, or if you are currently symptomatic, you may seek evaluation by your heart doctor prior to initiating moderate to intense exercise programs.                                  Additional Care Considerations for Diabetes/Prediabetes   -Diabetes  is a chronic disease.  The most important care consideration is regular follow-up with your diabetes care provider with the goal being avoiding or delaying its complications and to take advantage of advances in medications and technology.  If appropriate actions are taken early enough, type 2 diabetes can even be   reversed.  Seek information from the right source.  - Whole Food, Plant Predominant Nutrition is highly recommended: Eat Plenty of vegetables, Mushrooms, fruits, Legumes, Whole Grains, Nuts, seeds in lieu of processed meats, processed snacks/pastries red meat, poultry, eggs as recommended by American College of  Lifestyle Medicine (ACLM).  -Type 2 diabetes is known to coexist with other important comorbidities such as high blood pressure and high cholesterol.  It is critical to control not only the  diabetes but also the high blood pressure and high cholesterol to minimize and delay the risk of complications including coronary artery disease, stroke, amputations, blindness, etc.  The good news is that this diet recommendation for type 2 diabetes is also very helpful for managing high cholesterol and high blood blood pressure.  - Studies showed that people with diabetes will benefit from a class of medications known as ACE inhibitors and statins.  Unless there are specific reasons not to be on these medications, the standard of care is to consider getting one from these groups of medications at an optimal doses.  These medications are generally considered safe and proven to help protect the heart and the kidneys.    - People with diabetes are encouraged to initiate and maintain regular follow-up with eye doctors, foot doctors, dentists , and if necessary heart and kidney doctors.     - It is highly recommended that people with diabetes quit smoking or stay away from smoking, and get yearly  flu vaccine and pneumonia vaccine at least every 5 years.  See above for additional recommendations on exercise, sleep, stress management , and healthy social connections.      

## 2023-07-19 ENCOUNTER — Telehealth: Payer: Self-pay

## 2023-07-19 NOTE — Telephone Encounter (Signed)
Received notice pt's Libre 3 sensors need prior authorization.

## 2023-07-20 ENCOUNTER — Other Ambulatory Visit (HOSPITAL_COMMUNITY): Payer: Self-pay

## 2023-07-20 NOTE — Telephone Encounter (Signed)
Left a message requesting pt return call to the office. 

## 2023-07-21 DIAGNOSIS — D839 Common variable immunodeficiency, unspecified: Secondary | ICD-10-CM | POA: Diagnosis not present

## 2023-07-22 ENCOUNTER — Other Ambulatory Visit: Payer: Self-pay | Admitting: Family Medicine

## 2023-07-25 ENCOUNTER — Encounter: Payer: Self-pay | Admitting: Physician Assistant

## 2023-07-25 DIAGNOSIS — E1165 Type 2 diabetes mellitus with hyperglycemia: Secondary | ICD-10-CM | POA: Diagnosis not present

## 2023-08-01 DIAGNOSIS — E782 Mixed hyperlipidemia: Secondary | ICD-10-CM | POA: Diagnosis not present

## 2023-08-01 DIAGNOSIS — E039 Hypothyroidism, unspecified: Secondary | ICD-10-CM | POA: Diagnosis not present

## 2023-08-01 DIAGNOSIS — E118 Type 2 diabetes mellitus with unspecified complications: Secondary | ICD-10-CM | POA: Diagnosis not present

## 2023-08-02 ENCOUNTER — Encounter: Payer: Self-pay | Admitting: Cardiology

## 2023-08-02 ENCOUNTER — Other Ambulatory Visit (HOSPITAL_COMMUNITY)
Admission: RE | Admit: 2023-08-02 | Discharge: 2023-08-02 | Disposition: A | Discharge status: Home / Self Care | Payer: 59 | Source: Ambulatory Visit | Attending: Psychiatry | Admitting: Psychiatry

## 2023-08-02 ENCOUNTER — Ambulatory Visit: Payer: 59 | Attending: Cardiology | Admitting: Cardiology

## 2023-08-02 ENCOUNTER — Encounter: Payer: Self-pay | Admitting: Psychiatry

## 2023-08-02 VITALS — BP 118/58 | HR 64 | Ht 64.0 in | Wt 202.8 lb

## 2023-08-02 DIAGNOSIS — E782 Mixed hyperlipidemia: Secondary | ICD-10-CM | POA: Diagnosis not present

## 2023-08-02 DIAGNOSIS — I5032 Chronic diastolic (congestive) heart failure: Secondary | ICD-10-CM | POA: Diagnosis not present

## 2023-08-02 DIAGNOSIS — I495 Sick sinus syndrome: Secondary | ICD-10-CM

## 2023-08-02 DIAGNOSIS — Z79899 Other long term (current) drug therapy: Secondary | ICD-10-CM | POA: Diagnosis not present

## 2023-08-02 DIAGNOSIS — R55 Syncope and collapse: Secondary | ICD-10-CM | POA: Diagnosis not present

## 2023-08-02 DIAGNOSIS — I10 Essential (primary) hypertension: Secondary | ICD-10-CM

## 2023-08-02 LAB — VALPROIC ACID LEVEL: Valproic Acid Lvl: 77 ug/mL (ref 50.0–100.0)

## 2023-08-02 NOTE — Progress Notes (Signed)
Clinical Summary Laura [name] is a 62 y.o.female seen today for follow up of the following medical problems.      1.Chest pain  08/2019 cath LV end diastolic pressure is mildly elevated. There is no aortic valve stenosis. Aortic atherosclerosis/calcification noted. No angiographically apparently CAD.      - no recent  chest pains.       2.Orthostatic syncope - prior issues with orthostatic syncope - at prior office visit so severely orthostatic could not get up from exam table, EMS called - have cut back her diuretic, bp meds   Depakote 12-25% dizziness, 1-5% orthostatic hypotension.  Gabapentin dizziness 17-28%  Imipramine: orthostatic hypotension Topamax: dizziness 14% Reglan: dizziness Jan 2023 HgbA1c 7.8, previously 8   - no change depakote at least 1 year, gabapentin was increased about 6 months, no changes in topamax,    - no recent symptoms - working stay well hydrated.        2. Persistent afib/Tachy brady - admission with cardiogenic syncope, bradycardia - weaned av nodal agents. Issues with tachy and brady/pauses - had pacemaker placed 03/03/22 - given palpitations we restarted her bisoprolol at 2.5mg  on 03/24/22 - ongoing palpitations, not improved.  -no bleeding on eliquis.      - no palpitations - 05/2023 normal function - no bleeding on eliquis      3. HTN - she is compliant with meds    4 LE edema/Chronic diastolic HF - 08/2019 echo LVEF 60-65%, elevated LVEDP, mild RV dysfunction, mild to mod LAE,  11/2020 echo LVEF 65-70%, indet diastolic, low normal RV function - 02/2022 echo: LVEF 60-65%, no WMAs, indet diastolic, normal RV function - we changed torsemide to just prn given prior orthostatic symptoms, has not needed in some time.     - denies any edema, has not needed prn torsemide     5. Prior ASD repair   6. Asthma/Bronchitis - recent admission 04/2021 - followed by Dr Vassie Loll    7. Weight loss - working diet, exercise. Down 20  lbs since 01/2023  8. HLD - 07/2023 TC124 TG 120 HDL 49 LDL 104   Past Medical History:  Diagnosis Date   Anemia    Aortic atherosclerosis (HCC)    Asthma    Atrial fibrillation and flutter (HCC)    a.) CHA2DS2VASc = 6 (sex, HTN, TIA x2, vascular disease history, T2DM);  b.) DCCV ~ 2009; c.) DCCV 08/29/2018 (200J x1 --> SR with 1st degree AVB); d.) rate/rhythm maintained on oral diltiazem + bisoprolol; chronically anticoagulated with apixaban   Bipolar disorder (HCC)    Carpal tunnel syndrome of right wrist    CHB (complete heart block) (HCC)    a.) s/p St. Jude dual chamber PPM placement   CHF (congestive heart failure) (HCC)    Complication of anesthesia    a.) "allergic reaction" to perioperative analgesics + anesthetic medications in [place]; unsure of combination; not able to recount reactions/events --> states "I ended up in the ICU for 5 days"   Constipation    a.) on linaclotide   COPD (chronic obstructive pulmonary disease) (HCC)    DDD (degenerative disc disease), thoracic    GERD (gastroesophageal reflux disease)    H/O congenital atrial septal defect (ASD) repair    Hallux valgus of right foot    Hammertoe of second toe of right foot    History of 2019 novel coronavirus disease (COVID-19)    a.) 10/2020; b.) 05/2021   HLD (hyperlipidemia)  HTN (hypertension)    Hypogammaglobulinemia (HCC)    a.) on Curitru   Hypothyroid    IgE deficiency (HCC)    Long term current use of anticoagulant    a.) apixaban   Lumbar stenosis    Migraines    Mitral stenosis 02/08/2022   a.) TTE 02/08/2022: EF 60-65%, mild MAC, mild MV stenosis (peak grad 14.6 / mean grad 4.0)   Neuropathy    NSVT (nonsustained ventricular tachycardia) (HCC) 10/31/2018   a.) Holter 10/31/2018: 6 beat run   Obesity    OSA on CPAP    Osteoarthritis    Presence of permanent cardiac 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 Jude dual chamber device; placed for symptomatic bradycardia secondary to intermittent  CHB   PTSD (post-traumatic stress disorder)    Pulmonary nodules    Recurrent sinusitis    Short-term memory loss    Sleep difficulties    a.) takes melatonin   Symptomatic bradycardia    a.) s/p St. Jude dual chamber PPM placement   Syncope    TIA (transient ischemic attack)    x3-last one in 2015   Tremors of nervous system    Type 2 diabetes mellitus (HCC)      Allergies  Allergen Reactions   Ativan [Lorazepam] Hives   Phenergan [Promethazine Hcl] Hives     Current Outpatient Medications  Medication Sig Dispense Refill   albuterol (PROVENTIL) (2.5 MG/3ML) 0.083% nebulizer solution Take 3 mLs (2.5 mg total) by nebulization every 4 (four) hours as needed for wheezing or shortness of breath. 75 mL 1   albuterol (VENTOLIN HFA) 108 (90 Base) MCG/ACT inhaler INHALE 2 PUFFS INTO THE LUNGS EVERY 6 HOURS AS NEEDED FOR WHEEZING OR SHORTNESS OF BREATH 18 g 1   atorvastatin (LIPITOR) 20 MG tablet Take 20 mg by mouth at bedtime.     Azelastine HCl 137 MCG/SPRAY SOLN Place 1 spray into both nostrils daily.     BD PEN NEEDLE NANO 2ND GEN 32G X 4 MM MISC 1 each by Other route in the morning, at noon, in the evening, and at bedtime. 90 each 0   bisoprolol (ZEBETA) 10 MG tablet TAKE ONE TABLET BY MOUTH DAILY AT 9AM 90 tablet 3   blood glucose meter kit and supplies 1 each by Other route 4 (four) times daily. Dispense based on patient and insurance preference. Use up to four times daily as directed. (FOR ICD-10 E10.9, E11.9). 1 each 0   Cholecalciferol (VITAMIN D3) 125 MCG (5000 UT) CAPS Take 1 capsule (5,000 Units total) by mouth daily. 90 capsule 0   Continuous Glucose Sensor (FREESTYLE LIBRE 3 SENSOR) MISC 1 Piece by Does not apply route every 14 (fourteen) days. Place 1 sensor on the skin every 14 days. Use to check glucose continuously 6 each 1   diltiazem (CARDIZEM CD) 300 MG 24 hr capsule TAKE ONE CAPSULE BY MOUTH DAILY AT 9AM 90 capsule 3   divalproex (DEPAKOTE ER) 500 MG 24 hr tablet  Take 1 tablet (500 mg total) by mouth daily AND 2 tablets (1,000 mg total) at bedtime. 270 tablet 0   Dulaglutide (TRULICITY) 1.5 MG/0.5ML SOPN Inject 1.5 mg into the skin once a week. (Patient taking differently: Inject 4.5 mg into the skin every Sunday.) 6 mL 1   ELDERBERRY PO Take 4 g by mouth daily. Gummies 2g each     ELIQUIS 5 MG TABS tablet TAKE ONE TABLET BY MOUTH TWICE DAILY @ 9AM & 5PM 180 tablet 1  empagliflozin (JARDIANCE) 10 MG TABS tablet Take 1 tablet (10 mg total) by mouth daily before breakfast. 90 tablet 1   EPINEPHrine 0.3 mg/0.3 mL IJ SOAJ injection Inject 0.3 mg into the muscle once as needed for anaphylaxis.     FASENRA PEN 30 MG/ML SOAJ INJECT 30MG  SUBCUTANEOUSLY EVERY 8 WEEKS 1 mL 8   FERROUS SULFATE PO Take 65 mg by mouth every evening.     fluticasone furoate-vilanterol (BREO ELLIPTA) 100-25 MCG/ACT AEPB INHALE 1 PUFF INTO LUNGS DAILY 180 each 3   gabapentin (NEURONTIN) 600 MG tablet Take 600 mg by mouth 3 (three) times daily.     hydrOXYzine (ATARAX) 25 MG tablet TAKE ONE TABLET BY MOUTH THREE TIMES DAILY AS NEEDED FOR ANXIETY OR FOR ITCHING 30 tablet 1   Immune Globulin, Human, (CUVITRU [place]) Inject 27 mg into the skin every 14 (fourteen) days.     insulin glargine (LANTUS SOLOSTAR) 100 UNIT/ML Solostar Pen Inject 30 Units into the skin at bedtime. 30 mL 0   Insulin Pen Needle (PEN NEEDLES 3/16") 31G X 5 MM MISC Use as directed with insulin pen 100 each 0   levalbuterol (XOPENEX HFA) 45 MCG/ACT inhaler INHALE TWO PUFFS INTO LUNGS EVERY 6 HOURS AS NEEDED FOR WHEEZING 15 g 2   levalbuterol (XOPENEX) 1.25 MG/3ML nebulizer solution Inhale 1.25 mg into the lungs daily as needed (asthma).     levothyroxine (SYNTHROID, LEVOTHROID) 112 MCG tablet Take 112 mcg by mouth daily before breakfast.     lidocaine (LIDODERM) 5 % Place 1 patch onto the skin daily. Remove & Discard patch within 12 hours or as directed by MD (Patient not taking: Reported on 06/22/2023) 30 patch 11    linaclotide (LINZESS) 145 MCG CAPS capsule Take 1 capsule (145 mcg total) by mouth daily before breakfast. 90 capsule 3   losartan (COZAAR) 50 MG tablet Take 50 mg by mouth every morning.     MELATONIN GUMMIES PO Take 10 mg by mouth at bedtime.     metFORMIN (GLUCOPHAGE) 500 MG tablet Take 500 mg by mouth 2 (two) times daily.     montelukast (SINGULAIR) 10 MG tablet Take 10 mg by mouth at bedtime.     Multiple Vitamins-Minerals (HAIR SKIN & NAILS PO) Take 1 tablet by mouth daily.     Multiple Vitamins-Minerals (MULTIVITAMIN WITH MINERALS) tablet Take 1 tablet by mouth daily. Woman     Nebulizer MISC Nebulizer tubing kit 2 each 5   omeprazole (PRILOSEC) 20 MG capsule TAKE ONE CAPSULE BY MOUTH TWICE DAILY @ 9AM & 5PM BEOFRE MEALS (Patient taking differently: Take 20 mg by mouth daily.) 60 capsule 2   ondansetron (ZOFRAN-ODT) 8 MG disintegrating tablet Take 8 mg by mouth daily as needed for vomiting or nausea.     ONETOUCH ULTRA test strip 1 each by Other route in the morning, at noon, in the evening, and at bedtime.     polyethylene glycol (MIRALAX / GLYCOLAX) 17 g packet Take 17 g by mouth daily as needed. (Patient taking differently: Take 17 g by mouth daily as needed for moderate constipation or severe constipation.) 14 each 0   Potassium Chloride ER 20 MEQ TBCR Take 20 mEq by mouth daily.     sertraline (ZOLOFT) 25 MG tablet Take 1 tablet (25 mg total) by mouth at bedtime. 90 tablet 0   Spacer/Aero-Holding Chambers (AEROCHAMBER PLUS) inhaler 1 each by Other route as needed for other. Use as instructed 1 each 0   topiramate (TOPAMAX) 100  MG tablet Take 1 tablet (100 mg total) by mouth 2 (two) times daily. 180 tablet 2   topiramate (TOPAMAX) 50 MG tablet TAKE ONE TABLET (50MG  TOTAL) BY MOUTH DAILY (Patient taking differently: Take 50 mg by mouth See admin instructions. Take with 100 mg in the morning for a total of 150 mg in the morning) 90 tablet 1   torsemide (DEMADEX) 20 MG tablet Take 20 mg by  mouth daily as needed (Fluid).     Current Facility-Administered Medications  Medication Dose Route Frequency Provider Last Rate Last Admin   Benralizumab SOSY 30 mg  30 mg Subcutaneous Q28 days Hetty Blend, FNP   30 mg at 12/27/21 1603     Past Surgical History:  Procedure Laterality Date   ASD REPAIR  1968   BIOPSY  01/26/2021   Procedure: BIOPSY;  Surgeon: Lanelle Bal, DO;  Location: [facility];  Service: Endoscopy;;  gastric   CARDIOVERSION  2009   CARDIOVERSION N/A 08/29/2018   Procedure: CARDIOVERSION;  Surgeon: Antoine Poche, MD;  Location: [facility];  Service: Endoscopy;  Laterality: N/A;   CARPAL TUNNEL RELEASE Bilateral    CESAREAN SECTION  1986, 1988, 1994   COLONOSCOPY WITH PROPOFOL N/A 01/26/2021   Procedure: COLONOSCOPY WITH PROPOFOL;  Surgeon: Lanelle Bal, DO;  Location: [facility];  Service: Endoscopy;  Laterality: N/A;  am appt, diabetic   ESOPHAGOGASTRODUODENOSCOPY (EGD) WITH PROPOFOL N/A 01/26/2021   Procedure: ESOPHAGOGASTRODUODENOSCOPY (EGD) WITH PROPOFOL;  Surgeon: Lanelle Bal, DO;  [facility]: [facility];  Service: Endoscopy;  Laterality: N/A;   HALLUX VALGUS [place]USTIN Right 07/29/2022   Procedure: HALLUX VALGUS;  Surgeon: Edwin Cap, DPM;  Location: [facility];  Service: Podiatry;  Laterality: Right;   HAMMER TOE SURGERY Right 07/29/2022   Procedure: HAMMER TOE CORRECTION 2ND;  Surgeon: Edwin Cap, DPM;  Location: [facility];  Service: Podiatry;  Laterality: Right;   LEFT HEART CATH AND CORONARY ANGIOGRAPHY N/A 08/30/2019   Procedure: LEFT HEART CATH AND CORONARY ANGIOGRAPHY;  Surgeon: Corky Crafts, MD;  [facility] INVASIVE CV LAB;  Service: Cardiovascular;  Laterality: N/A;   NASAL SINUS SURGERY  2017   PACEMAKER IMPLANT N/A 03/03/2022   Procedure: PACEMAKER IMPLANT;  Surgeon: Marinus Maw, MD;  Location: [facility] INVASIVE CV LAB;  Service: Cardiovascular;  Laterality: N/A;   POLYPECTOMY  01/26/2021   Procedure:  POLYPECTOMY;  Surgeon: Lanelle Bal, DO;  Location: [facility];  Service: Endoscopy;;  colon   TOOTH EXTRACTION N/A 07/04/2023   Procedure: DENTAL RESTORATION/EXTRACTIONS;  Surgeon: Ocie Doyne, DMD;  Location: [facility] OR;  Service: Oral Surgery;  Laterality: N/A;     Allergies  Allergen Reactions   Ativan [Lorazepam] Hives   Phenergan [Promethazine Hcl] Hives      Family History  Problem Relation Age of Onset   Hypertension Mother    Stroke Mother    Kidney disease Mother    Heart attack Mother    Alcohol abuse Mother    Other Father        ?colon cancer   Kidney disease Sister    Hypertension Sister    Bipolar disorder Sister    Atrial fibrillation Sister    Heart disease Brother    Prostate cancer Brother    Thyroid disease Daughter    Hashimoto's thyroiditis Daughter    Celiac disease Daughter    Allergic rhinitis Daughter    Stroke Maternal Aunt    Heart disease Maternal  Aunt    Asthma Neg Hx      Social History Ms. Scherr reports that she quit smoking about 16 years ago. Her smoking use included cigarettes. She started smoking about 44 years ago. She has a 28 pack-year smoking history. She has never used smokeless tobacco. Ms. Moxley reports that she does not currently use alcohol.   Review of Systems CONSTITUTIONAL: No weight loss, fever, chills, weakness or fatigue.  HEENT: Eyes: No visual loss, blurred vision, double vision or yellow sclerae.No hearing loss, sneezing, congestion, runny nose or sore throat.  SKIN: No rash or itching.  CARDIOVASCULAR: per hpi RESPIRATORY: No shortness of breath, cough or sputum.  GASTROINTESTINAL: No anorexia, nausea, vomiting or diarrhea. No abdominal pain or blood.  GENITOURINARY: No burning on urination, no polyuria NEUROLOGICAL: No headache, dizziness, syncope, paralysis, ataxia, numbness or tingling in the extremities. No change in bowel or bladder control.  MUSCULOSKELETAL: No muscle, back pain, joint pain or  stiffness.  LYMPHATICS: No enlarged nodes. No history of splenectomy.  PSYCHIATRIC: No history of depression or anxiety.  ENDOCRINOLOGIC: No reports of sweating, cold or heat intolerance. No polyuria or polydipsia.  Marland Kitchen   Physical Examination Today's Vitals   08/02/23 1003  BP: (!) 118/58  Pulse: 64  SpO2: 97%  Weight: 202 lb 12.8 oz (92 kg)  Height: 5\' 4"  (1.626 m)   Body mass index is 34.81 kg/m.  Gen: resting comfortably, no acute distress HEENT: no scleral icterus, pupils equal round and reactive, no palptable cervical adenopathy,  CV: RRR, no m/r,g no jvd Resp: Clear to auscultation bilaterally GI: abdomen is soft, non-tender, non-distended, normal bowel sounds, no hepatosplenomegaly MSK: extremities are warm, no edema.  Skin: warm, no rash Neuro:  no focal deficits Psych: appropriate affect   Diagnostic Studies  08/2019 cath LV end diastolic pressure is mildly elevated. There is no aortic valve stenosis. Aortic atherosclerosis/calcification noted. No angiographically apparently CAD.     08/2019 echo 1. Left ventricular ejection fraction, by visual estimation, is 60 to  65%. The left ventricle has normal function. There is mildly increased  left ventricular hypertrophy.   2. Elevated left ventricular end-diastolic pressure.   3. Left ventricular diastolic function could not be evaluated.   4. Global right ventricle has mildly reduced systolic function.The right  ventricular size is normal. No increase in right ventricular wall  thickness.   5. Left atrial size was mild-moderately dilated.   6. Right atrial size was normal.   7. Moderate to severe mitral annular calcification.   8. The mitral valve is degenerative. Trace mitral valve regurgitation.   9. The tricuspid valve is grossly normal. Tricuspid valve regurgitation  is trivial.  10. The aortic valve is tricuspid. Aortic valve regurgitation is not  visualized. Mild aortic valve sclerosis without stenosis.   11. The pulmonic valve was grossly normal. Pulmonic valve regurgitation is  not visualized.  12. The inferior vena cava is normal in size with greater than 50%  respiratory variability, suggesting right atrial pressure of 3 mmHg.       Assessment and Plan  1. Tachy brady/afib now with pacemaker/acquired thrombophilia - no symptoms, continue current meds   3. Chronic diastolic HF -has done very well, has prn torsemide but has not needed - continue to monitor   4. Orthostatic syncope - resolved symptoms, contineu regular hydration     5. HTN - at goal, continue current meds  6. HLD - LDL right around goal, continue currenth therapy. If further  uptrend would need to titrate her statin.   F/u 6 months        Antoine Poche, M.D.

## 2023-08-02 NOTE — Patient Instructions (Signed)
Medication Instructions:  Your physician recommends that you continue on your current medications as directed. Please refer to the Current Medication list given to you today.  *If you need a refill on your cardiac medications before your next appointment, please call your pharmacy*   Lab Work: None If you have labs (blood work) drawn today and your tests are completely normal, you will receive your results only by: MyChart Message (if you have MyChart) OR A paper copy in the mail If you have any lab test that is abnormal or we need to change your treatment, we will call you to review the results.   Testing/Procedures: None   Follow-Up: At [facility], you and your health needs are our priority.  As part of our continuing mission to provide you with exceptional heart care, we have created designated Provider Care Teams.  These Care Teams include your primary Cardiologist (physician) and Advanced Practice Providers (APPs -  Physician Assistants and Nurse Practitioners) who all work together to provide you with the care you need, when you need it.  We recommend signing up for the patient portal called "MyChart".  Sign up information is provided on this After Visit Summary.  MyChart is used to connect with patients for Virtual Visits (Telemedicine).  Patients are able to view lab/test results, encounter notes, upcoming appointments, etc.  Non-urgent messages can be sent to your provider as well.   To learn more about what you can do with MyChart, go to [email or web address].    Your next appointment:   6 month(s)  Provider:   Jonathan Branch, MD    Other Instructions    

## 2023-08-04 ENCOUNTER — Encounter: Payer: Self-pay | Admitting: Family Medicine

## 2023-08-04 DIAGNOSIS — R809 Proteinuria, unspecified: Secondary | ICD-10-CM | POA: Diagnosis not present

## 2023-08-04 DIAGNOSIS — D839 Common variable immunodeficiency, unspecified: Secondary | ICD-10-CM | POA: Diagnosis not present

## 2023-08-04 DIAGNOSIS — I110 Hypertensive heart disease with heart failure: Secondary | ICD-10-CM | POA: Diagnosis not present

## 2023-08-04 DIAGNOSIS — G4730 Sleep apnea, unspecified: Secondary | ICD-10-CM | POA: Diagnosis not present

## 2023-08-04 DIAGNOSIS — I10 Essential (primary) hypertension: Secondary | ICD-10-CM | POA: Diagnosis not present

## 2023-08-04 DIAGNOSIS — E782 Mixed hyperlipidemia: Secondary | ICD-10-CM | POA: Diagnosis not present

## 2023-08-04 DIAGNOSIS — E1142 Type 2 diabetes mellitus with diabetic polyneuropathy: Secondary | ICD-10-CM | POA: Diagnosis not present

## 2023-08-04 DIAGNOSIS — I480 Paroxysmal atrial fibrillation: Secondary | ICD-10-CM | POA: Diagnosis not present

## 2023-08-04 DIAGNOSIS — I5032 Chronic diastolic (congestive) heart failure: Secondary | ICD-10-CM | POA: Diagnosis not present

## 2023-08-04 DIAGNOSIS — I442 Atrioventricular block, complete: Secondary | ICD-10-CM | POA: Diagnosis not present

## 2023-08-04 DIAGNOSIS — M2041 Other hammer toe(s) (acquired), right foot: Secondary | ICD-10-CM | POA: Diagnosis not present

## 2023-08-08 ENCOUNTER — Other Ambulatory Visit: Payer: Self-pay | Admitting: "Endocrinology

## 2023-08-09 ENCOUNTER — Ambulatory Visit (HOSPITAL_COMMUNITY): Payer: 59 | Admitting: Clinical

## 2023-08-09 DIAGNOSIS — F4310 Post-traumatic stress disorder, unspecified: Secondary | ICD-10-CM | POA: Diagnosis not present
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
## 2023-08-14 ENCOUNTER — Telehealth: Payer: Self-pay

## 2023-08-14 ENCOUNTER — Other Ambulatory Visit: Payer: Self-pay

## 2023-08-14 ENCOUNTER — Other Ambulatory Visit: Payer: Self-pay | Admitting: Psychiatry

## 2023-08-14 MED ORDER — FLUTICASONE FUROATE-VILANTEROL 100-25 MCG/ACT IN AEPB: 1.0000 | INHALATION_SPRAY | Freq: Every day | RESPIRATORY_TRACT | 5 refills | Status: DC

## 2023-08-14 MED ORDER — TORSEMIDE 20 MG PO TABS: 20.0000 mg | ORAL_TABLET | Freq: Every day | ORAL | 3 refills | Status: DC | PRN

## 2023-08-14 MED ORDER — DIVALPROEX SODIUM ER 500 MG PO TB24: ORAL_TABLET | ORAL | 0 refills | Status: DC

## 2023-08-14 NOTE — Telephone Encounter (Signed)
Ordered

## 2023-08-14 NOTE — Telephone Encounter (Signed)
received fax requesting a refill on the depakote er 500 mg . pt was last seen on 10-2 next appt 12-20

## 2023-08-14 NOTE — Telephone Encounter (Signed)
Pt.notified

## 2023-08-15 ENCOUNTER — Other Ambulatory Visit: Payer: Self-pay

## 2023-08-15 DIAGNOSIS — E1165 Type 2 diabetes mellitus with hyperglycemia: Secondary | ICD-10-CM

## 2023-08-15 MED ORDER — EMPAGLIFLOZIN 10 MG PO TABS: 10.0000 mg | ORAL_TABLET | Freq: Every day | ORAL | 1 refills | Status: DC

## 2023-08-15 MED ORDER — LANTUS SOLOSTAR 100 UNIT/ML ~~LOC~~ SOPN: 40.0000 [IU] | PEN_INJECTOR | Freq: Every day | SUBCUTANEOUS | 1 refills | Status: DC

## 2023-08-15 MED ORDER — TRULICITY 4.5 MG/0.5ML ~~LOC~~ SOAJ: 4.5000 mg | SUBCUTANEOUS | 1 refills | Status: DC

## 2023-08-16 ENCOUNTER — Telehealth: Payer: Self-pay | Admitting: Psychiatry

## 2023-08-16 ENCOUNTER — Telehealth: Payer: Self-pay

## 2023-08-16 ENCOUNTER — Telehealth: Payer: Self-pay | Admitting: Cardiology

## 2023-08-16 DIAGNOSIS — F4310 Post-traumatic stress disorder, unspecified: Secondary | ICD-10-CM

## 2023-08-16 MED ORDER — APIXABAN 5 MG PO TABS: 5.0000 mg | ORAL_TABLET | Freq: Two times a day (BID) | ORAL | 1 refills | Status: DC

## 2023-08-16 MED ORDER — BISOPROLOL FUMARATE 10 MG PO TABS: 10.0000 mg | ORAL_TABLET | Freq: Every day | ORAL | 3 refills | Status: DC

## 2023-08-16 MED ORDER — DILTIAZEM HCL ER COATED BEADS 300 MG PO CP24: 300.0000 mg | ORAL_CAPSULE | Freq: Every day | ORAL | 3 refills | Status: DC

## 2023-08-16 NOTE — Telephone Encounter (Signed)
pt called states that she has changed her [facility] to [facility], so she needs her sertraline, hydroxyzine and depakote. next appt 09-29-23 next appt 07-12-23

## 2023-08-16 NOTE — Telephone Encounter (Signed)
Please contact [facility] to cancel her prescription for Depakote and any refills.  It looks like sertraline was sent out in September for 90 days and she will not be due until at least December.  Please verify with [facility] LAST FILL DATES for these medications. Also hydroxyzine was prescribed by another provider previously.  Please verify with patient and let me know.

## 2023-08-16 NOTE — Telephone Encounter (Signed)
*  STAT* If patient is at the pharmacy, call can be transferred to refill team.   1. Which medications need to be refilled? (please list name of each medication and dose if known) diltiazem (CARDIZEM CD) 300 MG 24 hr capsule bisoprolol (ZEBETA) 10 MG tablet   ELIQUIS 5 MG TABS tablet    2. Which pharmacy/location (including street and city if local pharmacy) is medication to be sent to? [facility] - [place], [place] - [address] [place]   3. Do they need a 30 day or 90 day supply? 90

## 2023-08-16 NOTE — Telephone Encounter (Signed)
Zetia/Dilt refill request completed. Will route to Anticoag for Eliquis refill.

## 2023-08-16 NOTE — Telephone Encounter (Signed)
I have send another phone note regarding this patient's prescription.

## 2023-08-16 NOTE — Telephone Encounter (Signed)
Prescription refill request for Eliquis received. Indication: AF Last office visit: 08/02/23  Dominga Ferry MD Scr: 0.69 on 08/01/23  Epic Age: 62 Weight: 92kg  Based on above findings Eliquis 5mg  twice daily is the appropriate dose.  Refill approved.

## 2023-08-17 ENCOUNTER — Other Ambulatory Visit: Payer: Self-pay

## 2023-08-17 MED ORDER — LEVALBUTEROL HCL 1.25 MG/3ML IN NEBU: INHALATION_SOLUTION | RESPIRATORY_TRACT | 2 refills | Status: DC

## 2023-08-17 MED ORDER — OMEPRAZOLE 20 MG PO CPDR: DELAYED_RELEASE_CAPSULE | ORAL | 2 refills | Status: DC

## 2023-08-18 DIAGNOSIS — D839 Common variable immunodeficiency, unspecified: Secondary | ICD-10-CM | POA: Diagnosis not present

## 2023-08-18 MED ORDER — SERTRALINE HCL 25 MG PO TABS: 25.0000 mg | ORAL_TABLET | Freq: Every day | ORAL | 0 refills | Status: DC

## 2023-08-18 NOTE — Telephone Encounter (Signed)
called [facility] they got a rx for the depakote and they will fill it today but they do not have a rx for sertralne. they do not have a rx

## 2023-08-18 NOTE — Telephone Encounter (Signed)
Noted  

## 2023-08-18 NOTE — Telephone Encounter (Signed)
I have sent sertraline 25 mg to [facility].

## 2023-08-18 NOTE — Telephone Encounter (Signed)
pharamacy states that the depakote and the sertraline was transfered to [facility].   they did not fill the depakote but that the sertraline was last filled on 07-18-23 for only #30 pills

## 2023-08-23 ENCOUNTER — Telehealth: Payer: Self-pay | Admitting: "Endocrinology

## 2023-08-23 NOTE — Telephone Encounter (Signed)
Pt is asking if we can pull her readings because her sugar has been dropping at night. She has lost weight so she thinks her lantus needs adjusting

## 2023-08-23 NOTE — Telephone Encounter (Signed)
This is Dr Isidoro Donning pt. Her readings are on Libreview. Please advise. She told front desk she was having low readings.

## 2023-08-23 NOTE — Telephone Encounter (Signed)
Yes,  have her lower her Lantus to 20 units SQ nightly.  I pulled her report.  She is looking really good, great progress!

## 2023-08-23 NOTE — Telephone Encounter (Signed)
Patient made aware.

## 2023-08-24 ENCOUNTER — Other Ambulatory Visit: Payer: Self-pay | Admitting: Psychiatry

## 2023-08-29 ENCOUNTER — Encounter: Payer: Self-pay | Admitting: Gastroenterology

## 2023-08-31 DIAGNOSIS — D839 Common variable immunodeficiency, unspecified: Secondary | ICD-10-CM | POA: Diagnosis not present

## 2023-09-01 ENCOUNTER — Ambulatory Visit: Payer: 59

## 2023-09-01 DIAGNOSIS — I495 Sick sinus syndrome: Secondary | ICD-10-CM | POA: Diagnosis not present

## 2023-09-01 LAB — CUP PACEART REMOTE DEVICE CHECK
Battery Remaining Longevity: 101 mo
Battery Remaining Percentage: 87 %
Battery Voltage: 3.01 V
Brady Statistic AP VP Percent: 49 %
Brady Statistic AP VS Percent: 1 %
Brady Statistic AS VP Percent: 6 %
Brady Statistic AS VS Percent: 44 %
Brady Statistic RA Percent Paced: 49 %
Brady Statistic RV Percent Paced: 55 %
Date Time Interrogation Session: 20241122040013
Implantable Lead Connection Status: 753985
Implantable Lead Connection Status: 753985
Implantable Lead Implant Date: 20230525
Implantable Lead Implant Date: 20230525
Implantable Lead Location: 753859
Implantable Lead Location: 753860
Implantable Pulse Generator Implant Date: 20230525
Lead Channel Impedance Value: 460 Ohm
Lead Channel Impedance Value: 460 Ohm
Lead Channel Pacing Threshold Amplitude: 0.625 V
Lead Channel Pacing Threshold Amplitude: 1 V
Lead Channel Pacing Threshold Pulse Width: 0.5 ms
Lead Channel Pacing Threshold Pulse Width: 0.5 ms
Lead Channel Sensing Intrinsic Amplitude: 12 mV
Lead Channel Sensing Intrinsic Amplitude: 2.2 mV
Lead Channel Setting Pacing Amplitude: 0.875
Lead Channel Setting Pacing Amplitude: 2.5 V
Lead Channel Setting Pacing Pulse Width: 0.5 ms
Lead Channel Setting Sensing Sensitivity: 0.5 mV
Pulse Gen Model: 2272
Pulse Gen Serial Number: 8084453

## 2023-09-04 ENCOUNTER — Ambulatory Visit (HOSPITAL_COMMUNITY): Payer: 59 | Admitting: Clinical

## 2023-09-04 DIAGNOSIS — F4310 Post-traumatic stress disorder, unspecified: Secondary | ICD-10-CM | POA: Diagnosis not present

## 2023-09-04 NOTE — Progress Notes (Signed)
Virtual Visit via Video Note   I connected with Laura [name] on 09/04/2023 at  11:00 AM EST by a video enabled telemedicine application and verified that I am speaking with the correct person using two identifiers.   Location: Patient: Home Provider: Office   I discussed the limitations of evaluation and management by telemedicine and the availability of in person appointments. The patient expressed understanding and agreed to proceed.   THERAPIST PROGRESS NOTE   Session Time: 11:00 AM-:11:45 AM   Participation Level: Active   Behavioral Response: CasualAlertDepressed   Type of Therapy: Individual Therapy   Treatment Goals addressed: Coping/ non-medication treatment therapy for PTSD.   Interventions: CBT, DBT, Solution Focused, Strength-based and Supportive   Summary: Laura [name] is a 62 y.o. female who presents with PTSD. The OPT therapist worked with the patient for her ongoing OPT treatment session. The OPT therapist utilized Motivational Interviewing to assist in creating therapeutic repore.The patient in the session was engaged and work in collaboration giving feedback about her triggers and symptoms over the past few weeks.The patient spoke about plans for the upcoming Thanksgiving holiday and being able to spend time with her grandchildren. The OPT therapist utilized Cognitive Behavioral Therapy through cognitive restructuring as well as worked with the patient on coping strategies to assist in management of mental health symptoms. The OPT therapist worked with the patient on consistency in getting back to her health baseline and working to be consistent with her management of her external stressors with consistency especially around her interactions with other family members. The OPT therapist overviewed with the patient her basic health areas including sleep, eating, exercise, and hygiene.The OPT therapist placed emphasis on the patient keeping all upcoming health appointments  and adhering to ongoing health treatment recommendations. The OPT therapist reviewed all appointments listed coming up in the patients MyChart including upcoming appointment with [name] 09/29/2023.    Suicidal/Homicidal: Nowithout intent/plan   Therapist Response:The OPT therapist worked with the patient for the patients scheduled session. The patient was engaged in her session and gave feedback in relation to triggers, symptoms, and behavior responses over the past few weeks. The patient spoke about interactions with family and plans for upcoming Thanksgiving holidays.The OPT therapist worked with the patient utilizing an in session Cognitive Behavioral Therapy exercise.The patient has been working with her physical health providers to manage physical health conditions. The patient spoke about her mood and plans to stay busy over the course of the Thanksgiving holiday. The OPT therapist worked with the patient overviewing med management adherence and her involvement with [name] in the med therapy program and the upcoming appointment on 09/29/2023.The OPT therapist will continue treatment work with the patient in her next scheduled session.   Plan: Return again in 2/3 weeks.   Diagnosis:      Axis I: Post Traumatic Stress Disorder                            Axis II: No diagnosis     Collaboration of Care: Overview with the patient of involvement in the Med Management program with [name].   Patient/Guardian was advised Release of Information must be obtained prior to any record release in order to collaborate their care with an outside provider. Patient/Guardian was advised if they have not already done so to contact the registration department to sign all necessary forms in order for [place] to release information regarding their care.  Consent: Patient/Guardian gives verbal consent for treatment and assignment of benefits for services provided during this visit. Patient/Guardian expressed  understanding and agreed to proceed.      I discussed the assessment and treatment plan with the patient. The patient was provided an opportunity to ask questions and all were answered. The patient agreed with the plan and demonstrated an understanding of the instructions.   The patient was advised to call back or seek an in-person evaluation if the symptoms worsen or if the condition fails to improve as anticipated.   I provided 45 minutes of non-face-to-face time during this encounter.   Suzan Garibaldi, LCSW    09/04/2023

## 2023-09-06 ENCOUNTER — Encounter: Payer: Self-pay | Admitting: Nutrition

## 2023-09-06 NOTE — Patient Instructions (Signed)
Keep up the great job! Eat 34 g CHO at all meals Don't skip meals. Keep working out.

## 2023-09-12 ENCOUNTER — Encounter: Payer: Self-pay | Admitting: Gastroenterology

## 2023-09-12 ENCOUNTER — Ambulatory Visit (INDEPENDENT_AMBULATORY_CARE_PROVIDER_SITE_OTHER): Payer: 59 | Admitting: Gastroenterology

## 2023-09-12 VITALS — BP 147/83 | HR 72 | Temp 98.3°F | Ht 64.0 in | Wt 196.6 lb

## 2023-09-12 DIAGNOSIS — K219 Gastro-esophageal reflux disease without esophagitis: Secondary | ICD-10-CM

## 2023-09-12 DIAGNOSIS — K5900 Constipation, unspecified: Secondary | ICD-10-CM | POA: Diagnosis not present

## 2023-09-12 NOTE — Progress Notes (Signed)
GI Office Note    Referring Provider: Benita Stabile, MD Primary Care Physician:  Benita Stabile, MD  Primary Gastroenterologist: Hennie Duos. Marletta Lor, DO   Chief Complaint   Chief Complaint  Patient presents with   Follow-up    Having issues with constipation    History of Present Illness   Laura [name] is a 62 y.o. female presenting today for follow-up.  Last seen in December 2023.  History of constipation, GERD/dysphagia/gastritis.  Weight down about 28 pounds in over the last year. Patient has been working to lose additional weight. She has cleaned up her diet. Trying to drink more water but continues to struggle getting enough in.  Trulicity increased since her last visit 1 year ago.  She did have a period of time where she was having difficulty obtaining Trulicity and that is when she really started working with her diet more and exercising regularly.  She states her cardiologist also challenged her to lose 20 pounds before seeing him again during her follow-up visits.  She has had issues with more constipation over the past 1 month.  Had been taking Linzess 145 mcg daily and having regular BMs.  Has not been on MiraLAX for a while because she was doing well.  Eating more cheese lately which may be contributing.  Also she had significant dental work done and stopped drinking her coffee about a month ago which she feels also contributes to her constipation.  Started drinking coffee again the last couple of days and today finally had a soft stool.  She notes she has been having hard stools and straining, aggravated her hemorrhoids.  Complains of lower abdominal fullness and discomfort.  No melena or rectal bleeding.  Reflux well-controlled.  No dysphagia.  No nausea or vomiting.  Labs from October 2024: TSH 0.627, free T4 1.69, white blood cell count 3900, hemoglobin 14.7, platelets 140,000, glucose 143, creatinine 0.69, albumin 4.4, total bilirubin 0.3, alkaline phosphatase 75, AST 16,  ALT 13  CTA abd/pelvis 12/2022: IMPRESSION: 1. No evidence for aortic dissection or aneurysm. 2. No evidence for active gastrointestinal bleeding. Aortic Atherosclerosis (ICD10-I70.0). NON-VASCULAR 1. No acute localizing process in the abdomen or pelvis. 2. Sigmoid colon diverticulosis.  BPE 04/2021: No definite mass or stricture in esophagus. Small sliding type hiatal hernia. 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    EGD 01/2021: Gastritis-reactive gastropathy   Colonoscopy 01/2021: Non-bleeding internal hemorrhoids. Diverticulosis Two 5-8 mm ascending colon polyps -path tubular adenomas Next colonoscopy in five years  Medications   Current Outpatient Medications  Medication Sig Dispense Refill   albuterol (PROVENTIL) (2.5 MG/3ML) 0.083% nebulizer solution Take 3 mLs (2.5 mg total) by nebulization every 4 (four) hours as needed for wheezing or shortness of breath. 75 mL 1   albuterol (VENTOLIN HFA) 108 (90 Base) MCG/ACT inhaler INHALE 2 PUFFS INTO THE LUNGS EVERY 6 HOURS AS NEEDED FOR WHEEZING OR SHORTNESS OF BREATH 18 g 1   apixaban (ELIQUIS) 5 MG TABS tablet Take 1 tablet (5 mg total) by mouth 2 (two) times daily. 180 tablet 1   atorvastatin (LIPITOR) 20 MG tablet Take 20 mg by mouth at bedtime.     Azelastine HCl 137 MCG/SPRAY SOLN Place 1 spray into both nostrils daily.     BD PEN NEEDLE NANO 2ND GEN 32G X 4 MM MISC 1 each by Other route in the morning, at noon, in the evening, and at bedtime. 90 each 0   bisoprolol (  ZEBETA) 10 MG tablet Take 1 tablet (10 mg total) by mouth daily. 90 tablet 3   blood glucose meter kit and supplies 1 each by Other route 4 (four) times daily. Dispense based on patient and insurance preference. Use up to four times daily as directed. (FOR ICD-10 E10.9, E11.9). 1 each 0   Cholecalciferol (VITAMIN D3) 125 MCG (5000 UT) CAPS Take 1 capsule (5,000 Units total) by mouth daily. 90 capsule 0   Continuous Glucose  Sensor (FREESTYLE LIBRE 3 SENSOR) MISC 1 Piece by Does not apply route every 14 (fourteen) days. Place 1 sensor on the skin every 14 days. Use to check glucose continuously 6 each 1   diltiazem (CARDIZEM CD) 300 MG 24 hr capsule Take 1 capsule (300 mg total) by mouth daily. 90 capsule 3   divalproex (DEPAKOTE ER) 500 MG 24 hr tablet Take 1 tablet (500 mg total) by mouth daily AND 2 tablets (1,000 mg total) at bedtime. 270 tablet 0   Dulaglutide (TRULICITY) 4.5 MG/0.5ML SOAJ Inject 4.5 mg into the skin once a week. 6 mL 1   ELDERBERRY PO Take 4 g by mouth daily. Gummies 2g each     empagliflozin (JARDIANCE) 10 MG TABS tablet Take 1 tablet (10 mg total) by mouth daily before breakfast. 90 tablet 1   EPINEPHrine 0.3 mg/0.3 mL IJ SOAJ injection Inject 0.3 mg into the muscle once as needed for anaphylaxis.     FASENRA PEN 30 MG/ML SOAJ INJECT 30MG  SUBCUTANEOUSLY EVERY 8 WEEKS 1 mL 8   FERROUS SULFATE PO Take 65 mg by mouth every evening.     fluticasone furoate-vilanterol (BREO ELLIPTA) 100-25 MCG/ACT AEPB Inhale 1 puff into the lungs daily. 60 each 5   gabapentin (NEURONTIN) 600 MG tablet Take 600 mg by mouth 3 (three) times daily.     hydrOXYzine (ATARAX) 25 MG tablet TAKE ONE TABLET BY MOUTH THREE TIMES DAILY AS NEEDED FOR ANXIETY OR FOR ITCHING 30 tablet 1   Immune Globulin, Human, (CUVITRU [place]) Inject 27 mg into the skin every 14 (fourteen) days.     insulin glargine (LANTUS SOLOSTAR) 100 UNIT/ML Solostar Pen Inject 40 Units into the skin at bedtime. 45 mL 1   Insulin Pen Needle (PEN NEEDLES 3/16") 31G X 5 MM MISC Use as directed with insulin pen 100 each 0   levalbuterol (XOPENEX HFA) 45 MCG/ACT inhaler INHALE TWO PUFFS INTO LUNGS EVERY 6 HOURS AS NEEDED FOR WHEEZING 15 g 2   levalbuterol (XOPENEX) 1.25 MG/3ML nebulizer solution 1 vial (1.25 mg/3 mL)  nebulizer treatment every 4-6 hours as needed for cough, wheeze, shortness of breath or chest tightness. 72 mL 2   levothyroxine (SYNTHROID,  LEVOTHROID) 112 MCG tablet Take 112 mcg by mouth daily before breakfast.     linaclotide (LINZESS) 145 MCG CAPS capsule Take 1 capsule (145 mcg total) by mouth daily before breakfast. 90 capsule 3   losartan (COZAAR) 50 MG tablet Take 50 mg by mouth every morning.     MELATONIN GUMMIES PO Take 10 mg by mouth at bedtime.     metFORMIN (GLUCOPHAGE) 500 MG tablet Take 500 mg by mouth 2 (two) times daily.     montelukast (SINGULAIR) 10 MG tablet Take 10 mg by mouth at bedtime.     Multiple Vitamins-Minerals (HAIR SKIN & NAILS PO) Take 1 tablet by mouth daily.     Multiple Vitamins-Minerals (MULTIVITAMIN WITH MINERALS) tablet Take 1 tablet by mouth daily. Woman     Nebulizer MISC Nebulizer  tubing kit 2 each 5   omeprazole (PRILOSEC) 20 MG capsule TAKE ONE CAPSULE BY MOUTH TWICE DAILY @ 9AM & 5PM BEOFRE MEALS 60 capsule 2   ondansetron (ZOFRAN-ODT) 8 MG disintegrating tablet Take 8 mg by mouth daily as needed for vomiting or nausea.     ONETOUCH ULTRA test strip 1 each by Other route in the morning, at noon, in the evening, and at bedtime.     polyethylene glycol (MIRALAX / GLYCOLAX) 17 g packet Take 17 g by mouth daily as needed. (Patient taking differently: Take 17 g by mouth daily as needed for moderate constipation or severe constipation.) 14 each 0   Potassium Chloride ER 20 MEQ TBCR Take 20 mEq by mouth daily.     sertraline (ZOLOFT) 25 MG tablet Take 1 tablet (25 mg total) by mouth at bedtime. 90 tablet 0   Spacer/Aero-Holding Chambers (AEROCHAMBER PLUS) inhaler 1 each by Other route as needed for other. Use as instructed 1 each 0   topiramate (TOPAMAX) 100 MG tablet Take 1 tablet (100 mg total) by mouth 2 (two) times daily. 180 tablet 2   topiramate (TOPAMAX) 50 MG tablet TAKE ONE TABLET (50MG  TOTAL) BY MOUTH DAILY (Patient taking differently: Take 50 mg by mouth See admin instructions. Take with 100 mg in the morning for a total of 150 mg in the morning) 90 tablet 1   torsemide (DEMADEX) 20 MG  tablet Take 1 tablet (20 mg total) by mouth daily as needed (Fluid). 30 tablet 3   No current facility-administered medications for this visit.    Allergies   Allergies as of 09/12/2023 - Review Complete 09/12/2023  Allergen Reaction Noted   Ativan [lorazepam] Hives 02/06/2018   Phenergan [promethazine hcl] Hives 02/06/2018      Review of Systems   General: Negative for anorexia, weight loss, fever, chills, fatigue, weakness. ENT: Negative for hoarseness, difficulty swallowing , nasal congestion. CV: Negative for chest pain, angina, palpitations, dyspnea on exertion, peripheral edema.  Respiratory: Negative for dyspnea at rest, dyspnea on exertion, cough, sputum, wheezing.  GI: See history of present illness. GU:  Negative for dysuria, hematuria, urinary incontinence, urinary frequency, nocturnal urination.  Endo: Negative for unusual weight change.     Physical Exam   BP (!) 147/83 (BP Location: Right Arm, Patient Position: Sitting, Cuff Size: Large)   Pulse 72   Temp 98.3 F (36.8 C) (Oral)   Ht 5\' 4"  (1.626 m)   Wt 196 lb 9.6 oz (89.2 kg)   SpO2 96%   BMI 33.75 kg/m    General: Well-nourished, well-developed in no acute distress.  Eyes: No icterus. Mouth: Oropharyngeal mucosa moist and pink   Heart: Regular rate and rhythm, no murmurs rubs or gallops.  Abdomen: Bowel sounds are normal, nontender, nondistended, no hepatosplenomegaly or masses,  no abdominal bruits or hernia , no rebound or guarding.  Rectal: not performed  Extremities: No lower extremity edema. No clubbing or deformities. Neuro: Alert and oriented x 4   Skin: Warm and dry, no jaundice.   Psych: Alert and cooperative, normal mood and affect.  Labs   Lab Results  Component Value Date   NA 135 07/04/2023   CL 107 07/04/2023   K 3.8 07/04/2023   CO2 19 (L) 07/04/2023   BUN 21 07/04/2023   CREATININE 0.64 07/04/2023   GFRNONAA >60 07/04/2023   CALCIUM 9.0 07/04/2023   PHOS 3.0 04/08/2021    ALBUMIN 4.3 03/03/2023   ALBUMIN 4.5 03/03/2023  GLUCOSE 253 (H) 07/04/2023   Lab Results  Component Value Date   ALT 10 03/03/2023   ALT 10 03/03/2023   AST 9 03/03/2023   AST 11 03/03/2023   ALKPHOS 85 03/03/2023   ALKPHOS 83 03/03/2023   BILITOT 0.2 03/03/2023   BILITOT 0.2 03/03/2023   Lab Results  Component Value Date   WBC 5.3 07/04/2023   HGB 14.5 07/04/2023   HCT 40.9 07/04/2023   MCV 94.2 07/04/2023   PLT 148 (L) 07/04/2023   Lab Results  Component Value Date   HGBA1C 7.0 07/12/2023    Imaging Studies   CUP PACEART REMOTE DEVICE CHECK  Result Date: 09/01/2023 Scheduled remote reviewed. Normal device function.  AMS EGM's appear FF oversensing, longest duration Next remote 91 days. LA, CVRS   Assessment/Plan:   GERD: -doing well -Reinforced antireflux measures -Continue omeprazole once to twice daily before meals  Constipation: -Worse over the past 1 month, likely multifactorial with change in diet, increased felicity, stopping coffee, need for increased fluid intake -Increase Linzess to 290 mcg daily.  Samples provided.  If this works for her she will call for prescription otherwise we will switch her to another medication. -She can also add MiraLAX back 1-2 scoops daily until soft stool then continue 1 scoop daily as needed -Encouraged increasing fluid intake, goal of around 100 ounces of noncaffeinated fluid daily -Assuming that her bowel function improves, we will see her back in 1 year otherwise she will call and let us know we will make further recommendations    Leanna Battles. Melvyn Neth, MHS, PA-C [facility]

## 2023-09-12 NOTE — Patient Instructions (Addendum)
Try switching Linzess to 290 mcg daily.  Samples provided.  If this works for you, call for prescription otherwise we will switch constipation medication. Continue omeprazole 1 capsule once to twice daily for acid reflux. Graduations on your weight loss efforts.  Keep up the good work! Return to the office in 1 year or sooner if needed.

## 2023-09-14 DIAGNOSIS — D839 Common variable immunodeficiency, unspecified: Secondary | ICD-10-CM | POA: Diagnosis not present

## 2023-09-16 ENCOUNTER — Other Ambulatory Visit: Payer: Self-pay | Admitting: Cardiology

## 2023-09-17 ENCOUNTER — Other Ambulatory Visit: Payer: Self-pay | Admitting: Family Medicine

## 2023-09-18 NOTE — Telephone Encounter (Signed)
Prescription refill request for Eliquis received. Indication: PAF Last office visit: 08/02/23  Dominga Ferry MD Scr: 0.69 on 08/01/23 Age: 62 Weight: 92kg  Based on above findings Eliquis 5mg  twice daily is the appropriate dose.  Refill approved.

## 2023-09-21 NOTE — Progress Notes (Signed)
Remote pacemaker transmission.   
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
## 2023-09-28 DIAGNOSIS — D839 Common variable immunodeficiency, unspecified: Secondary | ICD-10-CM | POA: Diagnosis not present

## 2023-09-29 ENCOUNTER — Telehealth (INDEPENDENT_AMBULATORY_CARE_PROVIDER_SITE_OTHER): Payer: 59 | Admitting: Psychiatry

## 2023-09-29 ENCOUNTER — Encounter: Payer: Self-pay | Admitting: Psychiatry

## 2023-09-29 DIAGNOSIS — F4310 Post-traumatic stress disorder, unspecified: Secondary | ICD-10-CM | POA: Diagnosis not present

## 2023-09-29 DIAGNOSIS — F3341 Major depressive disorder, recurrent, in partial remission: Secondary | ICD-10-CM | POA: Diagnosis not present

## 2023-09-29 DIAGNOSIS — Z79899 Other long term (current) drug therapy: Secondary | ICD-10-CM | POA: Diagnosis not present

## 2023-09-29 MED ORDER — DIVALPROEX SODIUM ER 500 MG PO TB24: ORAL_TABLET | ORAL | 0 refills | Status: DC

## 2023-09-29 MED ORDER — SERTRALINE HCL 25 MG PO TABS
25.0000 mg | ORAL_TABLET | Freq: Every day | ORAL | 0 refills | Status: DC
Start: 2023-11-16 — End: 2023-12-29

## 2023-09-29 NOTE — Patient Instructions (Signed)
Continue Depakote 500 mg in AM, 1000 mg at night  Continue sertraline 25 mg at night  Obtain  lab- CMP, CBC Next appointment: 3/21 at 9 am

## 2023-10-02 DIAGNOSIS — E119 Type 2 diabetes mellitus without complications: Secondary | ICD-10-CM | POA: Diagnosis not present

## 2023-10-03 ENCOUNTER — Other Ambulatory Visit (HOSPITAL_COMMUNITY)
Admission: RE | Admit: 2023-10-03 | Discharge: 2023-10-03 | Disposition: A | Discharge status: Home / Self Care | Payer: 59 | Source: Ambulatory Visit | Attending: Psychiatry | Admitting: Psychiatry

## 2023-10-03 ENCOUNTER — Other Ambulatory Visit: Payer: Self-pay | Admitting: Family Medicine

## 2023-10-03 DIAGNOSIS — R7989 Other specified abnormal findings of blood chemistry: Secondary | ICD-10-CM | POA: Diagnosis not present

## 2023-10-03 DIAGNOSIS — Z79899 Other long term (current) drug therapy: Secondary | ICD-10-CM | POA: Diagnosis not present

## 2023-10-03 LAB — CBC
HCT: 45.3 % (ref 36.0–46.0)
Hemoglobin: 16 g/dL — ABNORMAL HIGH (ref 12.0–15.0)
MCH: 34.3 pg — ABNORMAL HIGH (ref 26.0–34.0)
MCHC: 35.3 g/dL (ref 30.0–36.0)
MCV: 97.2 fL (ref 80.0–100.0)
Platelets: 152 10*3/uL (ref 150–400)
RBC: 4.66 MIL/uL (ref 3.87–5.11)
RDW: 12.7 % (ref 11.5–15.5)
WBC: 3.2 10*3/uL — ABNORMAL LOW (ref 4.0–10.5)
nRBC: 0 % (ref 0.0–0.2)

## 2023-10-03 LAB — COMPREHENSIVE METABOLIC PANEL
ALT: 15 U/L (ref 0–44)
AST: 15 U/L (ref 15–41)
Albumin: 4.1 g/dL (ref 3.5–5.0)
Alkaline Phosphatase: 64 U/L (ref 38–126)
Anion gap: 7 (ref 5–15)
BUN: 15 mg/dL (ref 8–23)
CO2: 22 mmol/L (ref 22–32)
Calcium: 9 mg/dL (ref 8.9–10.3)
Chloride: 110 mmol/L (ref 98–111)
Creatinine, Ser: 0.66 mg/dL (ref 0.44–1.00)
GFR, Estimated: >60 mL/min (ref >60)
Glucose, Bld: 164 mg/dL — ABNORMAL HIGH (ref 70–99)
Potassium: 4.1 mmol/L (ref 3.5–5.1)
Sodium: 139 mmol/L (ref 135–145)
Total Bilirubin: 0.4 mg/dL (ref <1.2)
Total Protein: 7.9 g/dL (ref 6.5–8.1)

## 2023-10-06 ENCOUNTER — Other Ambulatory Visit: Payer: Self-pay

## 2023-10-06 ENCOUNTER — Emergency Department (HOSPITAL_COMMUNITY): Payer: 59

## 2023-10-06 ENCOUNTER — Ambulatory Visit (HOSPITAL_COMMUNITY): Payer: 59 | Admitting: Clinical

## 2023-10-06 ENCOUNTER — Encounter (HOSPITAL_COMMUNITY): Payer: Self-pay

## 2023-10-06 ENCOUNTER — Encounter (HOSPITAL_COMMUNITY): Payer: Self-pay | Admitting: Emergency Medicine

## 2023-10-06 ENCOUNTER — Emergency Department (HOSPITAL_COMMUNITY)
Admission: EM | Admit: 2023-10-06 | Discharge: 2023-10-06 | Disposition: A | Discharge status: Home / Self Care | Payer: 59 | Attending: Emergency Medicine | Admitting: Emergency Medicine

## 2023-10-06 DIAGNOSIS — Z7984 Long term (current) use of oral hypoglycemic drugs: Secondary | ICD-10-CM | POA: Diagnosis not present

## 2023-10-06 DIAGNOSIS — Z7985 Long-term (current) use of injectable non-insulin antidiabetic drugs: Secondary | ICD-10-CM | POA: Insufficient documentation

## 2023-10-06 DIAGNOSIS — I10 Essential (primary) hypertension: Secondary | ICD-10-CM | POA: Insufficient documentation

## 2023-10-06 DIAGNOSIS — R0789 Other chest pain: Secondary | ICD-10-CM | POA: Diagnosis not present

## 2023-10-06 DIAGNOSIS — R079 Chest pain, unspecified: Secondary | ICD-10-CM | POA: Diagnosis not present

## 2023-10-06 DIAGNOSIS — I2510 Atherosclerotic heart disease of native coronary artery without angina pectoris: Secondary | ICD-10-CM | POA: Insufficient documentation

## 2023-10-06 DIAGNOSIS — R6883 Chills (without fever): Secondary | ICD-10-CM | POA: Insufficient documentation

## 2023-10-06 DIAGNOSIS — F4310 Post-traumatic stress disorder, unspecified: Secondary | ICD-10-CM | POA: Insufficient documentation

## 2023-10-06 DIAGNOSIS — E119 Type 2 diabetes mellitus without complications: Secondary | ICD-10-CM | POA: Insufficient documentation

## 2023-10-06 DIAGNOSIS — R0981 Nasal congestion: Secondary | ICD-10-CM | POA: Insufficient documentation

## 2023-10-06 DIAGNOSIS — Z794 Long term (current) use of insulin: Secondary | ICD-10-CM | POA: Insufficient documentation

## 2023-10-06 DIAGNOSIS — I4891 Unspecified atrial fibrillation: Secondary | ICD-10-CM | POA: Diagnosis not present

## 2023-10-06 DIAGNOSIS — K30 Functional dyspepsia: Secondary | ICD-10-CM | POA: Diagnosis not present

## 2023-10-06 DIAGNOSIS — R1013 Epigastric pain: Secondary | ICD-10-CM | POA: Insufficient documentation

## 2023-10-06 DIAGNOSIS — Z20822 Contact with and (suspected) exposure to covid-19: Secondary | ICD-10-CM | POA: Insufficient documentation

## 2023-10-06 DIAGNOSIS — Z7901 Long term (current) use of anticoagulants: Secondary | ICD-10-CM | POA: Diagnosis not present

## 2023-10-06 DIAGNOSIS — G4730 Sleep apnea, unspecified: Secondary | ICD-10-CM | POA: Diagnosis not present

## 2023-10-06 DIAGNOSIS — Z950 Presence of cardiac pacemaker: Secondary | ICD-10-CM | POA: Diagnosis not present

## 2023-10-06 DIAGNOSIS — R059 Cough, unspecified: Secondary | ICD-10-CM | POA: Diagnosis not present

## 2023-10-06 DIAGNOSIS — M7918 Myalgia, other site: Secondary | ICD-10-CM | POA: Insufficient documentation

## 2023-10-06 DIAGNOSIS — Z79899 Other long term (current) drug therapy: Secondary | ICD-10-CM | POA: Insufficient documentation

## 2023-10-06 LAB — CBC
HCT: 42.9 % (ref 36.0–46.0)
Hemoglobin: 15.2 g/dL — ABNORMAL HIGH (ref 12.0–15.0)
MCH: 34.1 pg — ABNORMAL HIGH (ref 26.0–34.0)
MCHC: 35.4 g/dL (ref 30.0–36.0)
MCV: 96.2 fL (ref 80.0–100.0)
Platelets: 143 10*3/uL — ABNORMAL LOW (ref 150–400)
RBC: 4.46 MIL/uL (ref 3.87–5.11)
RDW: 12.6 % (ref 11.5–15.5)
WBC: 7.3 10*3/uL (ref 4.0–10.5)
nRBC: 0 % (ref 0.0–0.2)

## 2023-10-06 LAB — BASIC METABOLIC PANEL
Anion gap: 10 (ref 5–15)
BUN: 19 mg/dL (ref 8–23)
CO2: 20 mmol/L — ABNORMAL LOW (ref 22–32)
Calcium: 9.4 mg/dL (ref 8.9–10.3)
Chloride: 108 mmol/L (ref 98–111)
Creatinine, Ser: 0.66 mg/dL (ref 0.44–1.00)
GFR, Estimated: >60 mL/min (ref >60)
Glucose, Bld: 228 mg/dL — ABNORMAL HIGH (ref 70–99)
Potassium: 4 mmol/L (ref 3.5–5.1)
Sodium: 138 mmol/L (ref 135–145)

## 2023-10-06 LAB — RESP PANEL BY RT-PCR (RSV, FLU A&B, COVID)  RVPGX2
Influenza A by PCR: NEGATIVE
Influenza B by PCR: NEGATIVE
Resp Syncytial Virus by PCR: NEGATIVE
SARS Coronavirus 2 by RT PCR: NEGATIVE

## 2023-10-06 LAB — LIPASE, BLOOD: Lipase: 47 U/L (ref 11–51)

## 2023-10-06 LAB — TROPONIN I (HIGH SENSITIVITY)
Troponin I (High Sensitivity): 11 ng/L (ref <18)
Troponin I (High Sensitivity): 11 ng/L (ref <18)

## 2023-10-06 LAB — D-DIMER, QUANTITATIVE: D-Dimer, Quant: <0.27 ug{FEU}/mL (ref 0.00–0.50)

## 2023-10-06 MED ORDER — HYOSCYAMINE SULFATE 0.125 MG SL SUBL
0.2500 mg | SUBLINGUAL_TABLET | Freq: Once | SUBLINGUAL | Status: AC
  Administered 2023-10-06: 0.25 mg via SUBLINGUAL
  Filled 2023-10-06: qty 2

## 2023-10-06 MED ORDER — SUCRALFATE 1 GM/10ML PO SUSP: 1.0000 g | Freq: Three times a day (TID) | ORAL | 0 refills | Status: DC

## 2023-10-06 MED ORDER — PANTOPRAZOLE SODIUM 40 MG PO TBEC: 40.0000 mg | DELAYED_RELEASE_TABLET | Freq: Every day | ORAL | 0 refills | Status: DC

## 2023-10-06 MED ORDER — MORPHINE SULFATE (PF) 4 MG/ML IV SOLN
4.0000 mg | Freq: Once | INTRAVENOUS | Status: AC
  Administered 2023-10-06: 4 mg via INTRAVENOUS
  Filled 2023-10-06: qty 1

## 2023-10-06 MED ORDER — ALUM & MAG HYDROXIDE-SIMETH 200-200-20 MG/5ML PO SUSP
30.0000 mL | Freq: Once | ORAL | Status: AC
  Administered 2023-10-06: 30 mL via ORAL
  Filled 2023-10-06: qty 30

## 2023-10-06 MED ORDER — FAMOTIDINE IN NACL 20-0.9 MG/50ML-% IV SOLN
20.0000 mg | Freq: Once | INTRAVENOUS | Status: AC
  Administered 2023-10-06: 20 mg via INTRAVENOUS
  Filled 2023-10-06: qty 50

## 2023-10-06 MED ORDER — FENTANYL CITRATE PF 50 MCG/ML IJ SOSY
25.0000 ug | PREFILLED_SYRINGE | Freq: Once | INTRAMUSCULAR | Status: AC
  Administered 2023-10-06: 25 ug via INTRAVENOUS
  Filled 2023-10-06: qty 1

## 2023-10-06 NOTE — ED Triage Notes (Signed)
Pt reports chest pain, coughing, and "not feeling well" since Christmas. Pt reports the chest pain worsened over night. Rates the pain 10/10.

## 2023-10-06 NOTE — Discharge Instructions (Addendum)
It was a pleasure taking care of you today.  You were evaluated in the ER for pain in the chest.  Your results showed no sign of heart attack, no sign of blood clots, no pneumonia.  I feel this is possibly due to GERD.  Follow-up with your GI doctor and take medications as prescribed.  It is important to follow-up closely with your primary care doctor and/or any specialist as discussed.  Come back to the ER if you have any new or worsening symptoms.

## 2023-10-06 NOTE — ED Provider Notes (Signed)
[facility] EMERGENCY DEPARTMENT AT [facility] Provider Note   CSN: [id number] Arrival date & time: 10/06/23  4098     History  Chief Complaint  Patient presents with   Chest Pain    Laura [name] is a 62 y.o. female.  She has PMH of A-fib with pacemaker and on Eliquis, type 2 diabetes on insulin, HTN, sleep apnea, and PTSD.   [facility] the ER today for evaluation of complaints.  She states she started having a cough 2 days ago with chills and bodyaches with nasal congestion.  She also started having indigestion describes the epigastric burning that goes into her mid chest 2 days ago as well.  She states has been constant, somewhat worse with eating, she denies any other exacerbating or alleviating factors.  She has not taken any medications at home for this aside from her daily omeprazole.  Does report history of hiatal hernia but states she is never had indigestion like this in the past.  She has mild nausea with no vomiting.  She came in today because started feeling worse and started having some sharp central substernal chest pain that is nonradiating, not worse with exertion.  No associated sweating dizziness or nausea when the pain comes.  The last 1 to 2 minutes but does come.  Last cardiac cath in 2020 showed no CAD. Echo in 2023 showed LVEF 60-65%, normal RV function   Chest Pain      Home Medications Prior to Admission medications   Medication Sig Start Date End Date Taking? Authorizing Provider  albuterol (PROVENTIL) (2.5 MG/3ML) 0.083% nebulizer solution Take 3 mLs (2.5 mg total) by nebulization every 4 (four) hours as needed for wheezing or shortness of breath. 04/07/21   Nehemiah Settle, FNP  albuterol (VENTOLIN HFA) 108 (90 Base) MCG/ACT inhaler INHALE 2 PUFFS INTO THE LUNGS EVERY 6 HOURS AS NEEDED FOR WHEEZING OR SHORTNESS OF BREATH 04/07/21   Nehemiah Settle, FNP  atorvastatin (LIPITOR) 20 MG tablet Take 20 mg by mouth at bedtime. 12/26/22   [provider]  Azelastine HCl 137 MCG/SPRAY SOLN Place 1 spray into both nostrils daily. 03/24/23   [provider]  BD PEN NEEDLE NANO 2ND GEN 32G X 4 MM MISC 1 each by Other route in the morning, at noon, in the evening, and at bedtime. 05/16/22   Roma Kayser, MD  bisoprolol (ZEBETA) 10 MG tablet Take 1 tablet (10 mg total) by mouth daily. 08/16/23   Antoine Poche, MD  blood glucose meter kit and supplies 1 each by Other route 4 (four) times daily. Dispense based on patient and insurance preference. Use up to four times daily as directed. (FOR ICD-10 E10.9, E11.9). 01/27/21   Roma Kayser, MD  Cholecalciferol (VITAMIN D3) 125 MCG (5000 UT) CAPS Take 1 capsule (5,000 Units total) by mouth daily. 11/25/20   Roma Kayser, MD  Continuous Glucose Sensor (FREESTYLE LIBRE 3 SENSOR) MISC 1 Piece by Does not apply route every 14 (fourteen) days. Place 1 sensor on the skin every 14 days. Use to check glucose continuously 04/07/23   Roma Kayser, MD  diltiazem (CARDIZEM CD) 300 MG 24 hr capsule Take 1 capsule (300 mg total) by mouth daily. 08/16/23   Antoine Poche, MD  divalproex (DEPAKOTE ER) 500 MG 24 hr tablet Take 1 tablet (500 mg total) by mouth daily AND 2 tablets (1,000 mg total) at bedtime. 11/12/23 02/10/24  [name], MD  Dulaglutide (TRULICITY) 4.5 MG/0.5ML  SOAJ Inject 4.5 mg into the skin once a week. 08/15/23   Roma Kayser, MD  ELDERBERRY PO Take 4 g by mouth daily. Gummies 2g each    [provider]  ELIQUIS 5 MG TABS tablet TAKE ONE TABLET BY MOUTH TWICE DAILY @ 9AM & 5PM 09/18/23   Antoine Poche, MD  empagliflozin (JARDIANCE) 10 MG TABS tablet Take 1 tablet (10 mg total) by mouth daily before breakfast. 08/15/23   Nida, Denman George, MD  EPINEPHrine 0.3 mg/0.3 mL IJ SOAJ injection Inject 0.3 mg into the muscle once as needed for anaphylaxis. 01/17/20   [provider]  FASENRA PEN 30 MG/ML SOAJ INJECT 30MG  SUBCUTANEOUSLY EVERY 8  WEEKS 02/20/23   Alfonse Spruce, MD  FERROUS SULFATE PO Take 65 mg by mouth every evening.    [provider]  fluticasone furoate-vilanterol (BREO ELLIPTA) 100-25 MCG/ACT AEPB Inhale 1 puff into the lungs daily. 08/14/23   Oretha Milch, MD  gabapentin (NEURONTIN) 600 MG tablet Take 600 mg by mouth 3 (three) times daily. 07/20/21   [provider]  hydrOXYzine (ATARAX) 25 MG tablet TAKE ONE TABLET BY MOUTH THREE TIMES DAILY AS NEEDED FOR ANXIETY OR FOR ITCHING 03/10/23   Alfonse Spruce, MD  Immune Globulin, Human, (CUVITRU [place]) Inject 27 mg into the skin every 14 (fourteen) days. 12/24/21   [provider]  insulin glargine (LANTUS SOLOSTAR) 100 UNIT/ML Solostar Pen Inject 40 Units into the skin at bedtime. 08/15/23   Roma Kayser, MD  Insulin Pen Needle (PEN NEEDLES 3/16") 31G X 5 MM MISC Use as directed with insulin pen 10/21/21   Roma Kayser, MD  levalbuterol [facility] HFA) 45 MCG/ACT inhaler USE 2 INHALATIONS BY MOUTH EVERY 6 HOURS AS NEEDED FOR WHEEZING 10/03/23   Alfonse Spruce, MD  levalbuterol [facility]) 1.25 MG/3ML nebulizer solution 1 vial (1.25 mg/3 mL)  nebulizer treatment every 4-6 hours as needed for cough, wheeze, shortness of breath or chest tightness. 08/17/23   Ambs, Norvel Richards, FNP  levothyroxine (SYNTHROID, LEVOTHROID) 112 MCG tablet Take 112 mcg by mouth daily before breakfast.    [provider]  linaclotide Karlene Einstein) 145 MCG CAPS capsule Take 1 capsule (145 mcg total) by mouth daily before breakfast. 03/02/23   Tiffany Kocher, PA-C  losartan (COZAAR) 50 MG tablet Take 50 mg by mouth every morning.    [provider]  MELATONIN GUMMIES PO Take 10 mg by mouth at bedtime.    [provider]  metFORMIN (GLUCOPHAGE) 500 MG tablet Take 500 mg by mouth 2 (two) times daily. 07/21/21   [provider]  montelukast (SINGULAIR) 10 MG tablet Take 10 mg by mouth at bedtime. 11/23/20   [provider]  Multiple Vitamins-Minerals (HAIR SKIN & NAILS PO) Take 1 tablet by mouth daily.    [provider]  Multiple Vitamins-Minerals (MULTIVITAMIN WITH MINERALS) tablet Take 1 tablet by mouth daily. Woman    [provider]  Nebulizer MISC Nebulizer tubing kit 10/21/20   Alfonse Spruce, MD  omeprazole (PRILOSEC) 20 MG capsule TAKE ONE CAPSULE BY MOUTH TWICE DAILY @ 9AM & 5PM BEOFRE MEALS 08/17/23   Ambs, Norvel Richards, FNP  ondansetron (ZOFRAN-ODT) 8 MG disintegrating tablet Take 8 mg by mouth daily as needed for vomiting or nausea.    [provider]  [facility] ULTRA test strip 1 each by Other route in the morning, at noon, in the evening, and at bedtime. 10/17/19  [provider]  polyethylene glycol (MIRALAX / GLYCOLAX) 17 g packet Take 17 g by mouth daily as needed. Patient taking differently: Take 17 g by mouth daily as needed for moderate constipation or severe constipation. 04/17/21   [place], Alvira Philips, DO  Potassium Chloride ER 20 MEQ TBCR Take 20 mEq by mouth daily. 01/04/21   [provider]  sertraline (ZOLOFT) 25 MG tablet Take 1 tablet (25 mg total) by mouth at bedtime. 11/16/23 02/14/24  [name], MD  Spacer/Aero-Holding Chambers (AEROCHAMBER PLUS) inhaler 1 each by Other route as needed for other. Use as instructed 02/07/22   Ambs, Norvel Richards, FNP  topiramate (TOPAMAX) 100 MG tablet Take 1 tablet (100 mg total) by mouth 2 (two) times daily. 06/26/23   Marcos Eke, PA-C  topiramate (TOPAMAX) 50 MG tablet TAKE ONE TABLET (50MG  TOTAL) BY MOUTH DAILY Patient taking differently: Take 50 mg by mouth See admin instructions. Take with 100 mg in the morning for a total of 150 mg in the morning 06/13/23   Marcos Eke, PA-C  torsemide (DEMADEX) 20 MG tablet Take 1 tablet (20 mg total) by mouth daily as needed (Fluid). 08/14/23   Antoine Poche, MD      Allergies    Ativan [lorazepam] and Phenergan [promethazine hcl]    Review of Systems    Review of Systems  Cardiovascular:  Positive for chest pain.    Physical Exam Updated Vital Signs BP (!) 151/94   Pulse 67   Temp 98.3 F (36.8 C) (Oral)   Resp 14   Ht 5\' 4"  (1.626 m)   Wt 87.5 kg   BMI 33.13 kg/m  Physical Exam Vitals and nursing note reviewed.  Constitutional:      General: She is not in acute distress.    Appearance: She is well-developed.  HENT:     Head: Normocephalic and atraumatic.  Eyes:     Conjunctiva/sclera: Conjunctivae normal.  Cardiovascular:     Rate and Rhythm: Normal rate and regular rhythm.     Heart sounds: No murmur heard. Pulmonary:     Effort: Pulmonary effort is normal. No respiratory distress.     Breath sounds: Normal breath sounds.  Abdominal:     Palpations: Abdomen is soft.     Tenderness: There is no abdominal tenderness.  Musculoskeletal:        General: No swelling.     Cervical back: Neck supple.     Right lower leg: No tenderness. No edema.     Left lower leg: No tenderness. No edema.  Skin:    General: Skin is warm and dry.     Capillary Refill: Capillary refill takes less than 2 seconds.  Neurological:     General: No focal deficit present.     Mental Status: She is alert and oriented to person, place, and time.  Psychiatric:        Mood and Affect: Mood normal.     ED Results / Procedures / Treatments   Labs (all labs ordered are listed, but only abnormal results are displayed) Labs Reviewed  RESP PANEL BY RT-PCR (RSV, FLU A&B, COVID)  [id number]  BASIC METABOLIC PANEL  CBC  TROPONIN I (HIGH SENSITIVITY)    EKG EKG Interpretation Date/Time:  Friday October 06 2023 08:50:53 EST Ventricular Rate:  71 PR Interval:  407 QRS Duration:  125 QT Interval:  418 QTC Calculation: 455 R Axis:   75  Text Interpretation: Sinus rhythm Prolonged PR interval  Nonspecific intraventricular conduction delay Non-specific ST-t changes Confirmed by Cathren Laine ([id number]) on 10/06/2023 9:04:11 AM  Radiology No results  found.  Procedures Procedures    Medications Ordered in ED Medications - No data to display  ED Course/ Medical Decision Making/ A&P                                 Medical Decision Making The patient presented today for chest pain that had started last night with indigestion x 2 days. EKG showed nonspecific ST changes, Chest Xray was independently reviewed by me and shows no acute disease. I agree with radiology interpretation.   They were given fentanyl, gi cocktail, pepcid for their symptoms, and on repeat evaluation their symptoms are minimally improved, given morphine with better control  I considered a broad differential including but not limited to ACS, PE, Dissection, pneumothorax, costochondritis, pneumonia, bronchitis, GERD, pericarditis, and pericardial effusion.  PE is considered low risk and ruled out by d-dimer is negative  I feel disssection is very unlikely  Symptoms and EKG are not consisted with pericarditis  Their heart score is 4. Troponins show delta of 0  Plan is for d/co with FU for cardiology and with GI.  His pain seems to be worse with swallowing, initially after the first evaluation she has not had improvement but she had not received the Maalox yet.  After the Maalox she states that seems to have helped.  She already has a gastroenterologist was given follow-up with them.  Discussed with her to stop her omeprazole and start the Protonix and also prescribed Carafate.   Amount and/or Complexity of Data Reviewed Labs: ordered. Radiology: ordered and independent interpretation performed.    Details: Chest x-ray shows no pneumothorax, no pulmonary edema or infiltrate, I agree with radiology read  Risk OTC drugs. Prescription drug management.           Final Clinical Impression(s) / ED Diagnoses Final diagnoses:  None    Rx / DC Orders ED Discharge Orders     None         Josem Kaufmann 10/06/23 1608    Cathren Laine,  MD 10/10/23 [phone]

## 2023-10-07 ENCOUNTER — Encounter: Payer: Self-pay | Admitting: Psychiatry

## 2023-10-07 NOTE — Progress Notes (Signed)
Please inform the patient that there are some abnormalities in their complete blood cell count and advise her to contact their primary care provider for further evaluation. For now, I would advise that she continue with the same dose of Depakote.

## 2023-10-09 ENCOUNTER — Ambulatory Visit (HOSPITAL_COMMUNITY): Payer: 59 | Admitting: Clinical

## 2023-10-09 ENCOUNTER — Telehealth: Payer: Self-pay

## 2023-10-09 DIAGNOSIS — F4310 Post-traumatic stress disorder, unspecified: Secondary | ICD-10-CM | POA: Diagnosis not present

## 2023-10-09 NOTE — Telephone Encounter (Signed)
-----   Message from [place] sent at 10/07/2023  2:00 PM EST ----- Please inform the patient that there are some abnormalities in their complete blood cell count and advise her to contact their primary care provider for further evaluation. For now, I would advise that she continue with the same dose of Depakote.

## 2023-10-09 NOTE — Progress Notes (Signed)
Called patient to make aware of the message she stated that she was in the ER a few days ago and also had blood work and it was abnormal and she is scheduled to see the PCP on 10-12-23 I also made patient aware that Dr Vanetta Shawl advised that she continue with the same dose of the Depakote she voiced understanding

## 2023-10-09 NOTE — Progress Notes (Signed)
Virtual Visit via Video Note   I connected with Laura [name] on 10/09/2023 at  10:00 AM EST by a video enabled telemedicine application and verified that I am speaking with the correct person using two identifiers.   Location: Patient: Home Provider: Office   I discussed the limitations of evaluation and management by telemedicine and the availability of in person appointments. The patient expressed understanding and agreed to proceed.   THERAPIST PROGRESS NOTE   Session Time: 10:00 AM-:10:45 AM   Participation Level: Active   Behavioral Response: CasualAlertDepressed   Type of Therapy: Individual Therapy   Treatment Goals addressed: Coping/ non-medication treatment therapy for PTSD.   Interventions: CBT, DBT, Solution Focused, Strength-based and Supportive   Summary: Laura [name] is a 62 y.o. female who presents with PTSD. The OPT therapist worked with the patient for her ongoing OPT treatment session. The OPT therapist utilized Motivational Interviewing to assist in creating therapeutic repore.The patient in the session was engaged and work in collaboration giving feedback about her triggers and symptoms over the past few weeks.The patient was rescheduled to today post last schedule session the patient was in the hospital due to chest pain. The patient spoke about her experience leading up to going to the hospital with chest pain and being evaluated in the ER. The patient was released with follow up instructions and noted her blood work showed some inconsistency prompting a follow up with PCP and potentially a referral to hematology. The patient does have existing history of involvement with Cardiology and noted prior as well a difficulty from a hernia. Pt was informed by OPT if she continues to have chest pain or symptoms worsen to return to hospital for ongoing evaluation. The patient spoke about her Christmas experiences and involvement with family though the holidays. The patient  spoke about her interactions with one of her daughters which has had a pattern of conflict and this was a point of stress over the holiday with conflict with [name] the patients daughter.The OPT therapist utilized Cognitive Behavioral Therapy through cognitive restructuring as well as worked with the patient on coping strategies to assist in management of mental health symptoms. The OPT therapist worked with the patient on consistency in getting back to her health baseline and working to be consistent with her management of her external stressors with consistency especially around her interactions with other family members. The OPT therapist overviewed with the patient her basic health areas including sleep, eating, exercise, and hygiene.The OPT therapist placed emphasis on the patient keeping all upcoming health appointments and adhering to ongoing health treatment recommendations. The OPT therapist reviewed all appointments listed coming up in the patients MyChart.The OPT therapist scheduled follow up for late January 2025 post patients upcoming physical health provider follow ups in January post her hospital visit which occurred the day after Christmas.    Suicidal/Homicidal: Nowithout intent/plan   Therapist Response:The OPT therapist worked with the patient for the patients scheduled session. The patient was engaged in her session and gave feedback in relation to triggers, symptoms, and behavior responses over the past few weeks. The patient spoke about interactions with family during the Christmas holiday. The OPT therapist worked with the patient utilizing an in session Cognitive Behavioral Therapy exercise.The patient has been working with her physical health providers to manage physical health conditions. The patient spoke about her mood and concern and focus on her health moving forward with intent to follow up with her health providers. The OPT therapist worked with  the patient overviewing upcoming  appointments as listed in the patients Mychart.The OPT therapist will continue treatment work with the patient in her next scheduled session.   Plan: Return again in 2/3 weeks.   Diagnosis:      Axis I: Post Traumatic Stress Disorder                            Axis II: No diagnosis     Collaboration of Care: Overview with the patient of involvement in the Med Management program with [name].   Patient/Guardian was advised Release of Information must be obtained prior to any record release in order to collaborate their care with an outside provider. Patient/Guardian was advised if they have not already done so to contact the registration department to sign all necessary forms in order for [place] to release information regarding their care.    Consent: Patient/Guardian gives verbal consent for treatment and assignment of benefits for services provided during this visit. Patient/Guardian expressed understanding and agreed to proceed.      I discussed the assessment and treatment plan with the patient. The patient was provided an opportunity to ask questions and all were answered. The patient agreed with the plan and demonstrated an understanding of the instructions.   The patient was advised to call back or seek an in-person evaluation if the symptoms worsen or if the condition fails to improve as anticipated.   I provided 45 minutes of non-face-to-face time during this encounter.   Suzan Garibaldi, LCSW    10/09/2023

## 2023-10-09 NOTE — Telephone Encounter (Signed)
pt was notified and states she does have a appt set up this week for her pcp

## 2023-10-12 DIAGNOSIS — G4733 Obstructive sleep apnea (adult) (pediatric): Secondary | ICD-10-CM | POA: Diagnosis not present

## 2023-10-12 DIAGNOSIS — K219 Gastro-esophageal reflux disease without esophagitis: Secondary | ICD-10-CM | POA: Diagnosis not present

## 2023-10-12 DIAGNOSIS — D839 Common variable immunodeficiency, unspecified: Secondary | ICD-10-CM | POA: Diagnosis not present

## 2023-10-12 DIAGNOSIS — K5900 Constipation, unspecified: Secondary | ICD-10-CM | POA: Diagnosis not present

## 2023-10-12 DIAGNOSIS — R0789 Other chest pain: Secondary | ICD-10-CM | POA: Diagnosis not present

## 2023-10-12 DIAGNOSIS — I10 Essential (primary) hypertension: Secondary | ICD-10-CM | POA: Diagnosis not present

## 2023-10-16 ENCOUNTER — Ambulatory Visit: Payer: 59 | Admitting: Gastroenterology

## 2023-10-23 ENCOUNTER — Ambulatory Visit: Payer: 59 | Attending: Nurse Practitioner | Admitting: Nurse Practitioner

## 2023-10-23 ENCOUNTER — Encounter: Payer: Self-pay | Admitting: Nurse Practitioner

## 2023-10-23 VITALS — BP 122/62 | HR 78 | Ht 64.0 in | Wt 191.6 lb

## 2023-10-23 DIAGNOSIS — D696 Thrombocytopenia, unspecified: Secondary | ICD-10-CM

## 2023-10-23 DIAGNOSIS — R5383 Other fatigue: Secondary | ICD-10-CM

## 2023-10-23 DIAGNOSIS — R0789 Other chest pain: Secondary | ICD-10-CM

## 2023-10-23 DIAGNOSIS — E785 Hyperlipidemia, unspecified: Secondary | ICD-10-CM | POA: Diagnosis not present

## 2023-10-23 DIAGNOSIS — I4891 Unspecified atrial fibrillation: Secondary | ICD-10-CM

## 2023-10-23 DIAGNOSIS — I10 Essential (primary) hypertension: Secondary | ICD-10-CM | POA: Diagnosis not present

## 2023-10-23 DIAGNOSIS — I050 Rheumatic mitral stenosis: Secondary | ICD-10-CM

## 2023-10-23 DIAGNOSIS — I5032 Chronic diastolic (congestive) heart failure: Secondary | ICD-10-CM

## 2023-10-23 DIAGNOSIS — Z950 Presence of cardiac pacemaker: Secondary | ICD-10-CM | POA: Diagnosis not present

## 2023-10-23 NOTE — Patient Instructions (Addendum)
 Medication Instructions:  Your physician recommends that you continue on your current medications as directed. Please refer to the Current Medication list given to you today.   Labwork: None  Testing/Procedures: None  Follow-Up: Your physician recommends that you schedule a follow-up appointment in: 3 months  Any Other Special Instructions Will Be Listed Below (If Applicable).     If you need a refill on your cardiac medications before your next appointment, please call your pharmacy.

## 2023-10-23 NOTE — Progress Notes (Signed)
 Cardiology Office Note:  .   Date:  10/23/2023 ID:  Laura [name], DOB [birth year], MRN [id number] PCP: Laura Norleen PEDLAR, MD  [facility] Providers Cardiologist:  Laura Carrier, MD    History of Present Illness: .   Laura [name] is a 63 y.o. female with a PMH of chest pain, persistent A-fib, tachycardia-bradycardia syndrome, s/p PPM in 2023, hypertension, hyperlipidemia, chronic diastolic CHF, leg edema, history of orthostatic syncope, history of prior ASD repair, asthma, history of bronchitis, who presents today for hospital follow-up.  Last seen by Dr. Carrier Laura on August 02, 2023.  Was overall doing well at the time.  ED visit on October 06, 2023 at [facility] for chest pain.  Patient noted 2 days of cough along with nasal congestion and bodyaches.  She described having epigastric burning sensation/indigestion with radiation to mid chest along with the symptoms.  Denied any exacerbating or alleviating factors.  During time of ED arrival she started feeling worse and was having some sharp central substernal chest pain that was nonradiating, nonexertional.  Was overall unremarkable.  She was discharged home and instructed to follow-up with cardiology and GI.  She said after receiving Maalox, symptoms seemed to improve.    She presents today for follow-up. She admits to lower mid chest pain, epigastric pain and believes this is due to how she swallows, says she has some swallowing issues.  She admits to feeling fatigued and tells me she is seeing her gastroenterologist tomorrow for follow-up as she believes this is GI related.  She also tells me that she has had low platelets that is being watched closely by her PCP. Denies any shortness of breath, palpitations, syncope, presyncope, dizziness, orthopnea, PND, swelling or significant weight changes, acute bleeding, or claudication.  ROS: Negative.  See HPI.  Studies Reviewed: SABRA    EKG: EKG Interpretation Date/Time:  Monday  October 23 2023 16:04:45 EST Ventricular Rate:  70 PR Interval:  350 QRS Duration:  106 QT Interval:  420 QTC Calculation: 453 R Axis:   76  Text Interpretation: Sinus rhythm with 1st degree A-V block ST & T wave abnormality, consider inferior ischemia ST & T wave abnormality, consider anterolateral ischemia When compared with ECG of 06-Oct-2023 08:50, PREVIOUS ECG IS PRESENT Confirmed by Laura [name] [phone]) on 10/23/2023 4:08:09 PM   Echocardiogram 02/2022: 1. Left ventricular ejection fraction, by estimation, is 60 to 65%. The  left ventricle has normal function. The left ventricle has no regional  wall motion abnormalities. There is mild left ventricular hypertrophy.  Left ventricular diastolic parameters are indeterminate.   2. Right ventricular systolic function is normal. The right ventricular  size is normal. Tricuspid regurgitation signal is inadequate for assessing PA pressure.   3. Left atrial size was mildly dilated.   4. The mitral valve is abnormal. No evidence of mitral valve  regurgitation. Mild mitral stenosis.   5. The aortic valve has an indeterminant number of cusps. There is mild calcification of the aortic valve. There is mild thickening of the aortic valve. Aortic valve regurgitation is not visualized. No aortic stenosis is present.   6. The inferior vena cava is normal in size with greater than 50%  respiratory variability, suggesting right atrial pressure of 3 mmHg.  Monitor 01/2022: 7 day monitor 100% afib burden during study. Min HR 35, Max HR 117, Avg HR 61 6 pauses occurred, longest lasting 3.6 seconds which occurred at 543 AM. Pauses in general were nocturnal, no significant day time  pauses. Reported symptoms corresponded with aib     Patch Wear Time:  6 days and 23 hours (2023-03-20T09:16:56-0400 to 2023-03-27T08:49:59-0400)   Atrial Fibrillation occurred continuously (100% burden), ranging from 35-117 bpm (avg of 61 bpm). 6 Pauses occurred, the longest  lasting 3.6 secs (17 bpm). Isolated VEs were rare (<1.0%), VE Couplets were rare (<1.0%), and no VE Triplets were present.  Ventricular Bigeminy and Trigeminy were present.   LHC 08/2019:   LV end diastolic pressure is mildly elevated. There is no aortic valve stenosis. Aortic atherosclerosis/calcification noted. No angiographically apparently CAD.   Continue risk factor modification.  Patient requests to spend the night.  She lives in [place].     Restart Eliquis  tomorrow.  Check BMet in AM.  Likely discharge tomorrow.    Risk Assessment/Calculations:       The 10-year ASCVD risk score (Arnett DK, et al., 2019) is: 4.5%   Values used to calculate the score:     Age: 66 years     Sex: Female     Is Non-Hispanic African American: No     Diabetic: Yes     Tobacco smoker: No     Systolic Blood Pressure: 90 mmHg     Is BP treated: Yes     HDL Cholesterol: 44 mg/dL     Total Cholesterol: 157 mg/dL      Physical Exam:   VS:  BP 122/62   Pulse 78   Ht 5' 4 (1.626 m)   Wt 191 lb 9.6 oz (86.9 kg)   SpO2 96%   BMI 32.89 kg/m    Wt Readings from Last 3 Encounters:  10/25/23 191 lb 6.4 oz (86.8 kg)  10/24/23 190 lb 12.8 oz (86.5 kg)  10/23/23 191 lb 9.6 oz (86.9 kg)    GEN: Obese, 63 year old female in no acute distress NECK: No JVD; No carotid bruits CARDIAC: S1/S2, RRR, no murmurs, rubs, gallops RESPIRATORY:  Clear to auscultation without rales, wheezing or rhonchi  ABDOMEN: Soft, non-tender, non-distended EXTREMITIES:  No edema; No deformity   ASSESSMENT AND PLAN: .    Atypical chest pain Admits to atypical chest pain.  Workup in ED at [facility] in December, was ruled out for ACS.  Felt to have a GI component and was told to follow-up with GI.  She is scheduled see GI tomorrow.  No indication for ischemic evaluation at this time.  Heart catheterization in 2020 showed no angiographically apparent CAD.  No acute ischemic changes noted on EKG today.  Follow-up with  GI as scheduled.  Continue current medication regimen.  Continue to follow-up with PCP. Care and ED precautions discussed.  Chronic diastolic CHF Stage C, NYHA class I symptoms.  EF 60-65%. Euvolemic and well compensated on exam.  Weight is stable.  Continue current medication regimen. Low sodium diet, fluid restriction <2L, and daily weights encouraged. Educated to contact our office for weight gain of 2 lbs overnight or 5 lbs in one week.  A-fib, tachybrady syndrome, s/p PPM Denies any tachycardia or palpitations.  She is in sinus rhythm on exam today.  Heart rate is well-controlled.  Normal device function noted on most recent remote device check.  No medication changes at this time.  Continue Eliquis  for stroke prevention.  She is on appropriate dosage denies any bleeding issues.  HTN Blood pressure stable and well-controlled. Discussed to monitor BP at home at least 2 hours after medications and sitting for 5-10 minutes.  No medication changes at this  time.  Heart healthy diet and regular cardiovascular exercise encouraged.  HLD Most recent LDL on file 104.  LDL goal is less than 100.  Continue atorvastatin .  Heart healthy diet and regular cardiovascular exercise encouraged.  Not addressed but at next office visit we will discuss possibly increasing atorvastatin  to 40 mg daily.  6.  Mitral valve stenosis Noted on echocardiogram in 2023, noted to be mild.  There was no evidence of mitral valve regurgitation.  Overall denies any specific alarming symptoms.  Will continue to monitor and consider updating echocardiogram at next office visit.  Care and ED precautions discussed.  Thrombocytopenia Most recent platelet count was at 143,000.  Overall this has remained stable for the last 10 months.  1 year ago she had normal platelet count at 167,000.  Will continue to monitor this.  She denies any specific symptoms except for fatigue-see below.  Recommend to continue follow-up with PCP who is aware  and monitoring this closely.  Care and ED precautions discussed.  Fatigue Etiology multifactorial.  Recent cardiac workup in ED, was ruled out for ACS.  Left heart cath in 2020 showed no evidence of CAD.  Most recent echocardiogram showed normal EF.  Heart healthy diet regular cardiovascular exercise encouraged.  Continue follow-up with PCP.    Dispo: Care and ED precautions discussed.  Follow-up with me/APP in 3 months or sooner if anything changes.  Signed, Almarie Crate, NP

## 2023-10-24 ENCOUNTER — Encounter: Payer: Self-pay | Admitting: Gastroenterology

## 2023-10-24 ENCOUNTER — Ambulatory Visit (INDEPENDENT_AMBULATORY_CARE_PROVIDER_SITE_OTHER): Payer: 59 | Admitting: Gastroenterology

## 2023-10-24 ENCOUNTER — Telehealth: Payer: Self-pay | Admitting: *Deleted

## 2023-10-24 VITALS — BP 131/79 | HR 70 | Temp 98.1°F | Ht 64.0 in | Wt 190.8 lb

## 2023-10-24 DIAGNOSIS — D696 Thrombocytopenia, unspecified: Secondary | ICD-10-CM

## 2023-10-24 DIAGNOSIS — K5900 Constipation, unspecified: Secondary | ICD-10-CM

## 2023-10-24 DIAGNOSIS — R1310 Dysphagia, unspecified: Secondary | ICD-10-CM

## 2023-10-24 DIAGNOSIS — R1319 Other dysphagia: Secondary | ICD-10-CM

## 2023-10-24 DIAGNOSIS — K219 Gastro-esophageal reflux disease without esophagitis: Secondary | ICD-10-CM | POA: Diagnosis not present

## 2023-10-24 DIAGNOSIS — E1165 Type 2 diabetes mellitus with hyperglycemia: Secondary | ICD-10-CM | POA: Diagnosis not present

## 2023-10-24 DIAGNOSIS — R1013 Epigastric pain: Secondary | ICD-10-CM

## 2023-10-24 MED ORDER — PANTOPRAZOLE SODIUM 40 MG PO TBEC: 40.0000 mg | DELAYED_RELEASE_TABLET | Freq: Two times a day (BID) | ORAL | 3 refills | Status: DC

## 2023-10-24 MED ORDER — LINACLOTIDE 290 MCG PO CAPS: 290.0000 ug | ORAL_CAPSULE | Freq: Every day | ORAL | 3 refills | Status: DC

## 2023-10-24 NOTE — Progress Notes (Signed)
 GI Office Note    Referring Provider: Shona Norleen PEDLAR, MD Primary Care Physician:  Shona Norleen PEDLAR, MD  Primary Gastroenterologist: Carlin POUR. Cindie, DO   Chief Complaint   Chief Complaint  Patient presents with   ER Follow up    Was seen in ER recently for GERD, states that she has been having trouble swallowing as well.    History of Present Illness   Laura [name] is a 63 y.o. female presenting today for follow up. Last seen 09/2023. She has h/o constiptaion, GERD, dysphagia, gastritis.   Patient presenting for recent flare in symptoms.  Started feeling bad on Christmas Day.  Really could not pinpoint what was going on but has some difficulty swallowing.  Ended up going to bed early.  Lots of fatigue.  Woke up the next day feeling a little bit better but was having trouble eating.  Poor appetite.  Having some discomfort in the epigastric region and into the chest.  Symptoms lasted throughout the night and the next morning she was having severe pain in her chest and she thought she may be having a heart attack.  Pain radiating into the left breast.  She drove herself to the hospital.  She states they ruled out heart attack and thought it was GI related.  In the ED, EKG with nonspecific ST changes. Chest xray with no acute disease. Given pain meds, gi cocktail, maalox. Switched from omeprazole  to pantoprazole  and prescribed carafate .  Patient denies taking the pantoprazole .  She has since seen cardiology yesterday follow-up.  Notes not available at this time, per patient she was told to follow-up in 3 months and that they suspected her symptoms were GI related and she was advised to see us .    Labs in the ED 10/06/2023: D-dimer less than 0.27, troponin I x 2 was 11, respiratory panel negative, lipase 47, white blood cell count 7300, hemoglobin 15.2, platelets 143,000, glucose 228, BUN 19, creatinine 0.66.   Today: Patient states she is feeling a little bit better but continues to have  pain in the chest.  Continues to have swallowing issues, food and pills getting stuck and causing pain.  No nausea or vomiting.  Continues to have pain in the epigastric region and in between the breast.  Bowel movements are better on Linzess  290 mcg daily.  States that her psychiatrist has been concerned about her CBC findings, intermittent leukopenia and thrombocytopenia.  She plans for follow-up with PCP again in February for labs, contemplating hematology referral.  At this time she really does not want to add another provider to her list but is concerned that she has had these findings off and on for a year.  CT imaging last year with normal spleen and liver.  Denies NSAID and aspirin  use.  Labs from October 2024: TSH 0.627, free T4 1.69, white blood cell count 3900, hemoglobin 14.7, platelets 140,000, glucose 143, creatinine 0.69, albumin 4.4, total bilirubin 0.3, alkaline phosphatase 75, AST 16, ALT 13   CTA abd/pelvis 12/2022: IMPRESSION: 1. No evidence for aortic dissection or aneurysm. 2. No evidence for active gastrointestinal bleeding. Aortic Atherosclerosis (ICD10-I70.0). NON-VASCULAR 1. No acute localizing process in the abdomen or pelvis. 2. Sigmoid colon diverticulosis.   BPE 04/2021: No definite mass or stricture in esophagus. Small sliding type hiatal hernia. 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    EGD 01/2021: Gastritis-reactive gastropathy   Colonoscopy 01/2021: Non-bleeding internal hemorrhoids. Diverticulosis Two 5-8 mm ascending  colon polyps -path tubular adenomas Next colonoscopy in five years   Medications   Current Outpatient Medications  Medication Sig Dispense Refill   albuterol  (PROVENTIL ) (2.5 MG/3ML) 0.083% nebulizer solution Take 3 mLs (2.5 mg total) by nebulization every 4 (four) hours as needed for wheezing or shortness of breath. 75 mL 1   albuterol  (VENTOLIN  HFA) 108 (90 Base) MCG/ACT inhaler INHALE 2 PUFFS  INTO THE LUNGS EVERY 6 HOURS AS NEEDED FOR WHEEZING OR SHORTNESS OF BREATH 18 g 1   atorvastatin  (LIPITOR) 20 MG tablet Take 20 mg by mouth at bedtime.     Azelastine  HCl 137 MCG/SPRAY SOLN Place 1 spray into both nostrils daily.     BD PEN NEEDLE NANO 2ND GEN 32G X 4 MM MISC 1 each by Other route in the morning, at noon, in the evening, and at bedtime. 90 each 0   bisoprolol  (ZEBETA ) 10 MG tablet Take 1 tablet (10 mg total) by mouth daily. 90 tablet 3   blood glucose meter kit and supplies 1 each by Other route 4 (four) times daily. Dispense based on patient and insurance preference. Use up to four times daily as directed. (FOR ICD-10 E10.9, E11.9). 1 each 0   Cholecalciferol (VITAMIN D3) 125 MCG (5000 UT) CAPS Take 1 capsule (5,000 Units total) by mouth daily. 90 capsule 0   Continuous Glucose Sensor (FREESTYLE LIBRE 3 SENSOR) MISC 1 Piece by Does not apply route every 14 (fourteen) days. Place 1 sensor on the skin every 14 days. Use to check glucose continuously 6 each 1   diltiazem  (CARDIZEM  CD) 300 MG 24 hr capsule Take 1 capsule (300 mg total) by mouth daily. 90 capsule 3   [START ON 11/12/2023] divalproex  (DEPAKOTE  ER) 500 MG 24 hr tablet Take 1 tablet (500 mg total) by mouth daily AND 2 tablets (1,000 mg total) at bedtime. 270 tablet 0   Dulaglutide  (TRULICITY ) 4.5 MG/0.5ML SOAJ Inject 4.5 mg into the skin once a week. 6 mL 1   ELDERBERRY PO Take 4 g by mouth daily. Gummies 2g each     ELIQUIS  5 MG TABS tablet TAKE ONE TABLET BY MOUTH TWICE DAILY @ 9AM & 5PM 180 tablet 1   empagliflozin  (JARDIANCE ) 10 MG TABS tablet Take 1 tablet (10 mg total) by mouth daily before breakfast. 90 tablet 1   EPINEPHrine  0.3 mg/0.3 mL IJ SOAJ injection Inject 0.3 mg into the muscle once as needed for anaphylaxis.     FASENRA  PEN 30 MG/ML SOAJ INJECT 30MG  SUBCUTANEOUSLY EVERY 8 WEEKS 1 mL 8   FERROUS SULFATE  PO Take 65 mg by mouth every evening.     fluticasone  furoate-vilanterol (BREO ELLIPTA ) 100-25 MCG/ACT AEPB  Inhale 1 puff into the lungs daily. 60 each 5   gabapentin  (NEURONTIN ) 600 MG tablet Take 600 mg by mouth 3 (three) times daily.     hydrOXYzine  (ATARAX ) 25 MG tablet TAKE ONE TABLET BY MOUTH THREE TIMES DAILY AS NEEDED FOR ANXIETY OR FOR ITCHING 30 tablet 1   Immune Globulin , Human, (CUVITRU  [place]) Inject 27 mg into the skin every 14 (fourteen) days.     insulin  glargine (LANTUS  SOLOSTAR) 100 UNIT/ML Solostar Pen Inject 40 Units into the skin at bedtime. 45 mL 1   Insulin  Pen Needle (PEN NEEDLES 3/16) 31G X 5 MM MISC Use as directed with insulin  pen 100 each 0   levalbuterol  (XOPENEX  HFA) 45 MCG/ACT inhaler USE 2 INHALATIONS BY MOUTH EVERY 6 HOURS AS NEEDED FOR WHEEZING 45 g 0  levalbuterol  (XOPENEX ) 1.25 MG/3ML nebulizer solution 1 vial (1.25 mg/3 mL)  nebulizer treatment every 4-6 hours as needed for cough, wheeze, shortness of breath or chest tightness. 72 mL 2   levothyroxine  (SYNTHROID , LEVOTHROID) 112 MCG tablet Take 112 mcg by mouth daily before breakfast.     linaclotide  (LINZESS ) 145 MCG CAPS capsule Take 1 capsule (145 mcg total) by mouth daily before breakfast. 90 capsule 3   losartan  (COZAAR ) 50 MG tablet Take 50 mg by mouth every morning.     MELATONIN GUMMIES PO Take 10 mg by mouth at bedtime.     metFORMIN  (GLUCOPHAGE ) 500 MG tablet Take 500 mg by mouth 2 (two) times daily.     montelukast  (SINGULAIR ) 10 MG tablet Take 10 mg by mouth at bedtime.     Multiple Vitamins-Minerals (HAIR SKIN & NAILS PO) Take 1 tablet by mouth daily.     Multiple Vitamins-Minerals (MULTIVITAMIN WITH MINERALS) tablet Take 1 tablet by mouth daily. Woman     Nebulizer MISC Nebulizer tubing kit 2 each 5   omeprazole  (PRILOSEC) 20 MG capsule TAKE ONE CAPSULE BY MOUTH TWICE DAILY @ 9AM & 5PM BEOFRE MEALS 60 capsule 2   ondansetron  (ZOFRAN -ODT) 8 MG disintegrating tablet Take 8 mg by mouth daily as needed for vomiting or nausea.     ONETOUCH ULTRA test strip 1 each by Other route in the morning, at noon, in the  evening, and at bedtime.     polyethylene glycol (MIRALAX  / GLYCOLAX ) 17 g packet Take 17 g by mouth daily as needed. (Patient taking differently: Take 17 g by mouth daily as needed for moderate constipation or severe constipation.) 14 each 0   Potassium Chloride  ER 20 MEQ TBCR Take 20 mEq by mouth daily.     [START ON 11/16/2023] sertraline  (ZOLOFT ) 25 MG tablet Take 1 tablet (25 mg total) by mouth at bedtime. 90 tablet 0   Spacer/Aero-Holding Chambers (AEROCHAMBER PLUS) inhaler 1 each by Other route as needed for other. Use as instructed 1 each 0   topiramate  (TOPAMAX ) 100 MG tablet Take 1 tablet (100 mg total) by mouth 2 (two) times daily. 180 tablet 2   topiramate  (TOPAMAX ) 50 MG tablet TAKE ONE TABLET (50MG  TOTAL) BY MOUTH DAILY (Patient taking differently: Take 50 mg by mouth See admin instructions. Take with 100 mg in the morning for a total of 150 mg in the morning) 90 tablet 1   torsemide  (DEMADEX ) 20 MG tablet Take 1 tablet (20 mg total) by mouth daily as needed (Fluid). 30 tablet 3   No current facility-administered medications for this visit.    Allergies   Allergies as of 10/24/2023 - Review Complete 10/24/2023  Allergen Reaction Noted   Ativan [lorazepam] Hives 02/06/2018   Phenergan [promethazine hcl] Hives 02/06/2018     Past Medical History   Past Medical History:  Diagnosis Date   Anemia    Aortic atherosclerosis (HCC)    Asthma    Atrial fibrillation and flutter (HCC)    a.) CHA2DS2VASc = 6 (sex, HTN, TIA x2, vascular disease history, T2DM);  b.) DCCV ~ 2009; c.) DCCV 08/29/2018 (200J x1 --> SR with 1st degree AVB); d.) rate/rhythm maintained on oral diltiazem  + bisoprolol ; chronically anticoagulated with apixaban    Bipolar disorder (HCC)    Carpal tunnel syndrome of right wrist    CHB (complete heart block) (HCC)    a.) s/p St. Jude dual chamber PPM placement   CHF (congestive heart failure) (HCC)    Complication  of anesthesia    a.) allergic reaction to  perioperative analgesics + anesthetic medications in [place] ; unsure of combination; not able to recount reactions/events --> states I ended up in the ICU for 5 days   Constipation    a.) on linaclotide    COPD (chronic obstructive pulmonary disease) (HCC)    DDD (degenerative disc disease), thoracic    GERD (gastroesophageal reflux disease)    H/O congenital atrial septal defect (ASD) repair    Hallux valgus of right foot    Hammertoe of second toe of right foot    History of 2019 novel coronavirus disease (COVID-19)    a.) 10/2020; b.) 05/2021   HLD (hyperlipidemia)    HTN (hypertension)    Hypogammaglobulinemia (HCC)    a.) on Curitru   Hypothyroid    IgE deficiency (HCC)    Long term current use of anticoagulant    a.) apixaban    Lumbar stenosis    Migraines    Mitral stenosis 02/08/2022   a.) TTE 02/08/2022: EF 60-65%, mild MAC, mild MV stenosis (peak grad 14.6 / mean grad 4.0)   Neuropathy    NSVT (nonsustained ventricular tachycardia) (HCC) 10/31/2018   a.) Holter 10/31/2018: 6 beat run   Obesity    OSA on CPAP    Osteoarthritis    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 Jude dual chamber device; placed for symptomatic bradycardia secondary to intermittent CHB   PTSD (post-traumatic stress disorder)    Pulmonary nodules    Recurrent sinusitis    Short-term memory loss    Sleep difficulties    a.) takes melatonin   Symptomatic bradycardia    a.) s/p St. Jude dual chamber PPM placement   Syncope    TIA (transient ischemic attack)    x3-last one in 2015   Tremors of nervous system    Type 2 diabetes mellitus (HCC)     Past Surgical History   Past Surgical History:  Procedure Laterality Date   ASD REPAIR  1968   BIOPSY  01/26/2021   Procedure: BIOPSY;  Surgeon: Cindie Carlin POUR, DO;  Location: [facility];  Service: Endoscopy;;  gastric   CARDIOVERSION  2009   CARDIOVERSION N/A 08/29/2018   Procedure: CARDIOVERSION;  Surgeon:  Alvan Dorn FALCON, MD;  Location: [facility];  Service: Endoscopy;  Laterality: N/A;   CARPAL TUNNEL RELEASE Bilateral    CESAREAN SECTION  1986, 1988, 1994   COLONOSCOPY WITH PROPOFOL  N/A 01/26/2021   Procedure: COLONOSCOPY WITH PROPOFOL ;  Surgeon: Cindie Carlin POUR, DO;  Location: [facility];  Service: Endoscopy;  Laterality: N/A;  am appt, diabetic   ESOPHAGOGASTRODUODENOSCOPY (EGD) WITH PROPOFOL  N/A 01/26/2021   Procedure: ESOPHAGOGASTRODUODENOSCOPY (EGD) WITH PROPOFOL ;  Surgeon: Cindie Carlin POUR, DO;  Location: [facility];  Service: Endoscopy;  Laterality: N/A;   HALLUX VALGUS AUSTIN Right 07/29/2022   Procedure: HALLUX VALGUS;  Surgeon: Silva Juliene SAUNDERS, DPM;  Location: [facility];  Service: Podiatry;  Laterality: Right;   HAMMER TOE SURGERY Right 07/29/2022   Procedure: HAMMER TOE CORRECTION 2ND;  Surgeon: Silva Juliene SAUNDERS, DPM;  Location: [facility];  Service: Podiatry;  Laterality: Right;   LEFT HEART CATH AND CORONARY ANGIOGRAPHY N/A 08/30/2019   Procedure: LEFT HEART CATH AND CORONARY ANGIOGRAPHY;  Surgeon: Dann Candyce RAMAN, MD;  Location: [facility] INVASIVE CV LAB;  Service: Cardiovascular;  Laterality: N/A;   NASAL SINUS SURGERY  2017   PACEMAKER IMPLANT N/A 03/03/2022   Procedure: PACEMAKER IMPLANT;  Surgeon: Waddell Danelle ORN, MD;  [facility] INVASIVE CV LAB;  Service: Cardiovascular;  Laterality: N/A;   POLYPECTOMY  01/26/2021   Procedure: POLYPECTOMY;  Surgeon: Cindie Carlin POUR, DO;  Location: [facility];  Service: Endoscopy;;  colon   TOOTH EXTRACTION N/A 07/04/2023   Procedure: DENTAL RESTORATION/EXTRACTIONS;  Surgeon: Sheryle Hamilton, DMD;  Location: [facility] OR;  Service: Oral Surgery;  Laterality: N/A;    Past Family History   Family History  Problem Relation Age of Onset   Hypertension Mother    Stroke Mother    Kidney disease Mother    Heart attack Mother    Alcohol abuse Mother    Other Father        ?colon cancer   Kidney disease Sister    Hypertension  Sister    Bipolar disorder Sister    Atrial fibrillation Sister    Heart disease Brother    Prostate cancer Brother    Thyroid  disease Daughter    Hashimoto's thyroiditis Daughter    Celiac disease Daughter    Allergic rhinitis Daughter    Stroke Maternal Aunt    Heart disease Maternal Aunt    Asthma Neg Hx     Past Social History   Social History   Socioeconomic History   Marital status: Widowed    Spouse name: Not on file   Number of children: 3   Years of education: 10   Highest education level: Not on file  Occupational History   Not on file  Tobacco Use   Smoking status: Former    Current packs/day: 0.00    Average packs/day: 1 pack/day for 28.0 years (28.0 ttl pk-yrs)    Types: Cigarettes    Start date: 71    Quit date: 2008    Years since quitting: 17.0   Smokeless tobacco: Never  Vaping Use   Vaping status: Never Used  Substance and Sexual Activity   Alcohol use: Not Currently   Drug use: Not Currently   Sexual activity: Not Currently    Birth control/protection: Post-menopausal  Other Topics Concern   Not on file  Social History Narrative   Right handed   Drinks caffeine   One story home   Social Drivers of Health   Financial Resource Strain: High Risk (01/06/2022)   Overall Financial Resource Strain (CARDIA)    Difficulty of Paying Living Expenses: Hard  Food Insecurity: No Food Insecurity (01/06/2022)   Hunger Vital Sign    Worried About Running Out of Food in the Last Year: Never true    Ran Out of Food in the Last Year: Never true  Transportation Needs: No Transportation Needs (01/06/2022)   PRAPARE - Administrator, Civil Service (Medical): No    Lack of Transportation (Non-Medical): No  Physical Activity: Insufficiently Active (01/06/2022)   Exercise Vital Sign    Days of Exercise per Week: 4 days    Minutes of Exercise per Session: 30 min  Stress: No Stress Concern Present (01/06/2022)   [employer] of Occupational  Health - Occupational Stress Questionnaire    Feeling of Stress : Only a little  Social Connections: Moderately Integrated (01/06/2022)   Social Connection and Isolation Panel [NHANES]    Frequency of Communication with Friends and Family: More than three times a week    Frequency of Social Gatherings with Friends and Family: More than three times a week    Attends Religious Services: More than 4 times per year  Active Member of Clubs or Organizations: Yes    Attends Banker Meetings: More than 4 times per year    Marital Status: Widowed  Intimate Partner Violence: Not At Risk (01/06/2022)   Humiliation, Afraid, Rape, and Kick questionnaire    Fear of Current or Ex-Partner: No    Emotionally Abused: No    Physically Abused: No    Sexually Abused: No    Review of Systems   General: Negative for anorexia, weight loss, fever, chills, fatigue, weakness. ENT: Negative for hoarseness,  nasal congestion.  HPI CV: Negative for angina, palpitations, dyspnea on exertion, peripheral edema.  See HPI Respiratory: Negative for dyspnea at rest, dyspnea on exertion, cough, sputum, wheezing.  GI: See history of present illness. GU:  Negative for dysuria, hematuria, urinary incontinence, urinary frequency, nocturnal urination.  Endo: Negative for unusual weight change.     Physical Exam   BP 131/79 (BP Location: Right Arm, Patient Position: Sitting, Cuff Size: Normal)   Pulse 70   Temp 98.1 F (36.7 C) (Oral)   Ht 5' 4 (1.626 m)   Wt 190 lb 12.8 oz (86.5 kg)   SpO2 96%   BMI 32.75 kg/m    General: Well-nourished, well-developed in no acute distress.  Eyes: No icterus. Mouth: Oropharyngeal mucosa moist and pink   Lungs: Clear to auscultation bilaterally.  Heart: Regular rate and rhythm, no murmurs rubs or gallops.  Abdomen: Bowel sounds are normal,   nondistended, no hepatosplenomegaly or masses,  no abdominal bruits or hernia , no rebound or guarding.  Mild epigastric  tenderness Rectal:not Performed Extremities: No lower extremity edema. No clubbing or deformities. Neuro: Alert and oriented x 4   Skin: Warm and dry, no jaundice.   Psych: Alert and cooperative, normal mood and affect.  Labs   See HPI Imaging Studies   DG Chest 2 View Result Date: 10/06/2023 CLINICAL DATA:  Two day history of chest pain EXAM: CHEST - 2 VIEW COMPARISON:  Chest radiograph dated 01/11/2023 FINDINGS: Lines/tubes: Left chest wall pacemaker leads project over the right atrium and ventricle. Lungs: Well inflated lungs. No focal consolidation. Pleura: No pneumothorax or pleural effusion. Heart/mediastinum: The heart size and mediastinal contours are within normal limits. Bones: No acute osseous abnormality. IMPRESSION: No acute cardiopulmonary disease. Electronically Signed   By: Limin  Xu M.D.   On: 10/06/2023 09:45    Assessment/Plan:     GERD: -Was doing well back in December when I last saw her, taking omeprazole  once to twice daily before meals. -Recent acute onset chest pain/epigastric pain and swallowing concerns, unclear etiology but not felt to be cardiac in nature per patient seen cardiology yesterday with no plans for evaluation. Query acute esophagitis -We will switch her from omeprazole  to pantoprazole  40 mg twice daily before meals -EGD with esophageal dilation with Dr. Cindie.  ASA 3.  Clearance to hold Eliquis  for 48 hours.  I have discussed the risks, alternatives, benefits with regards to but not limited to the risk of reaction to medication, bleeding, infection, perforation and the patient is agreeable to proceed. Written consent to be obtained.  Constipation: doing better -Continue Linzess  290 mcg daily.  New prescription sent to pharmacy -Use MiraLAX  daily as needed -Encouraged 100 ounces of noncaffeinated fluid daily  Chronic intermittent mild thrombocytopenia: -She plans for follow-up with PCP next month to have repeat labs.  Consider hematology  evaluation, patient states she would like to discuss with her PCP sunnie Sonny RAMAN. Ezzard, MHS,  PA-C [facility]

## 2023-10-24 NOTE — Telephone Encounter (Signed)
  Request for patient to stop medication prior to procedure or is needing cleareance  10/24/23  Laura [name] 1961-07-18  What type of surgery is being performed? Esophagogastroduodenoscopy (EGD) with esophageal dilation  When is surgery scheduled? TBD  What type of clearance is required (medical or pharmacy to hold medication or both? medication  Are there any medications that need to be held prior to surgery and how long? Eliquis  x 2 days  Name of physician performing surgery?  Dr.Carver [facility] [employer]: [phone] Fax: [phone]  Anethesia type (none, local, MAC, general)? MAC

## 2023-10-24 NOTE — Telephone Encounter (Signed)
 Pharmacy please advise on holding Eliquis for 2 days prior to EGD with dilatation scheduled for T BD. Thank you.

## 2023-10-24 NOTE — Patient Instructions (Signed)
 Stop omeprazole .  Start pantoprazole  40 mg twice daily before breakfast and evening meal. Continue Linzess  290 mcg daily before breakfast. You may continue MiraLAX  1-2 capfuls daily as needed. Follow-up with PCP regarding low platelets, potential need for hematology referral. Upper endoscopy to be scheduled once we get clearance to hold your Eliquis  prior to procedure.

## 2023-10-25 ENCOUNTER — Ambulatory Visit (INDEPENDENT_AMBULATORY_CARE_PROVIDER_SITE_OTHER): Payer: 59 | Admitting: Allergy & Immunology

## 2023-10-25 VITALS — BP 90/60 | HR 73 | Temp 98.5°F | Resp 14 | Ht 62.52 in | Wt 191.4 lb

## 2023-10-25 DIAGNOSIS — R918 Other nonspecific abnormal finding of lung field: Secondary | ICD-10-CM | POA: Diagnosis not present

## 2023-10-25 DIAGNOSIS — R5383 Other fatigue: Secondary | ICD-10-CM

## 2023-10-25 DIAGNOSIS — K219 Gastro-esophageal reflux disease without esophagitis: Secondary | ICD-10-CM | POA: Diagnosis not present

## 2023-10-25 DIAGNOSIS — R634 Abnormal weight loss: Secondary | ICD-10-CM | POA: Diagnosis not present

## 2023-10-25 DIAGNOSIS — J4550 Severe persistent asthma, uncomplicated: Secondary | ICD-10-CM | POA: Diagnosis not present

## 2023-10-25 DIAGNOSIS — D839 Common variable immunodeficiency, unspecified: Secondary | ICD-10-CM | POA: Diagnosis not present

## 2023-10-25 DIAGNOSIS — J310 Chronic rhinitis: Secondary | ICD-10-CM

## 2023-10-25 MED ORDER — LEVALBUTEROL HCL 1.25 MG/3ML IN NEBU: 1.2500 mg | INHALATION_SOLUTION | Freq: Four times a day (QID) | RESPIRATORY_TRACT | 1 refills | Status: DC | PRN

## 2023-10-25 MED ORDER — MONTELUKAST SODIUM 10 MG PO TABS: 10.0000 mg | ORAL_TABLET | Freq: Every evening | ORAL | 1 refills | Status: DC

## 2023-10-25 MED ORDER — NEBULIZER MISC: 1.0000 | 1 refills | Status: AC

## 2023-10-25 MED ORDER — LEVALBUTEROL TARTRATE 45 MCG/ACT IN AERO: 2.0000 | INHALATION_SPRAY | Freq: Four times a day (QID) | RESPIRATORY_TRACT | 1 refills | Status: DC | PRN

## 2023-10-25 MED ORDER — FLUTICASONE FUROATE-VILANTEROL 100-25 MCG/ACT IN AEPB: 1.0000 | INHALATION_SPRAY | Freq: Every day | RESPIRATORY_TRACT | 5 refills | Status: DC

## 2023-10-25 MED ORDER — AZELASTINE HCL 137 MCG/SPRAY NA SOLN: 1.0000 | Freq: Every day | NASAL | 1 refills | Status: AC

## 2023-10-25 MED ORDER — SPACER/AERO-HOLDING CHAMBERS DEVI: 1.0000 | 1 refills | Status: AC

## 2023-10-25 NOTE — Progress Notes (Signed)
 Pre-operative cardiovascular risk assessment Ms. Zody's perioperative risk of a major cardiac event is 0.9% according to the Revised Cardiac Risk Index (RCRI).  Therefore, she is at low risk for perioperative complications.   Her functional capacity is excellent at > 4 METs according to the Duke Activity Status Index (DASI). Recommendations: According to ACC/AHA guidelines, no further cardiovascular testing needed.  The patient may proceed to surgery at acceptable risk.   Antiplatelet and/or Anticoagulation Recommendations: Eliquis  (Apixaban ) can be held for 2 days prior to surgery.  Please resume post op when felt to be safe.

## 2023-10-25 NOTE — Progress Notes (Signed)
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 with improved protection against infections with the higher dosing Cuvitru (27 grams every two weeks)   Previously history of OSA - with recent normal sleep study   Neuropathy    Tremors - thought to be medication induced (followed by [facility])   Headaches (followed by [facility])   Non-allergic rhinitis   Paroxysmal atrial fibrillation - followed by Cardiology   Enlarged lymph nodes on chest CT - with no mention of them on a recent chest CT in June 2023 (but they are mentioned in an April chest CT)   Eustachian tube dysfunction  Fatigue - getting some vitamin levels today  Plan/Recommendations:   1. Hypogammaglobulinemia - Continue with Cuvitru.  - We will continue with 27 g every 2 weeks. - We might consider increasing the dosage if this frequency of infections worsen.  - We are going to get an IgG level as well.   2. Chronic rhinitis   - Continue with nasal saline rinses as needed.  - Continue with Nasacort daily as instructed by your primary care provider.  - Refills sent in for azelastine.   3. Severe persistent asthma, uncomplicated - Lung testing looks excellent today - Daily medication: Continue Breo154mcg one puff once daily + montelukast 10 mg once a day + Fasenra every 8 weeks - Prior to physical activity: Xopenex 2 puffs 10-15 minutes before physical activity. - Rescue medications: Xopenex nebulizer every 4-6 hours as needed, Xopenex 4 puffs every 4-6 hours as needed, or DuoNeb nebulizer one vial every 4-6 hours as needed - Asthma control goals:  * Full participation in all desired activities (may need albuterol before activity) * Albuterol use two time or less a week on average (not counting use with activity) * Cough interfering with sleep two time or less a month * Oral steroids no more than once a year *  No hospitalizations.  4.  Reflux  - Continue dietary lifestyle modifications as listed below  - Continue omeprazole twice a day to control reflux.    - Agree with plan for endoscopy.   5. Fatigue and loss of appetite - We are going to get some labs including vitamin B and vitamin D. - We are also going to get iron studies and thyroid studies.  - Definitely tell Dr. Fransico Him about this as well.   6. Return in about 3 months (around 01/23/2024). You can have the follow up appointment with Dr. Dellis Anes or a Nurse Practicioner (our Nurse Practitioners are excellent and always have Physician oversight!).     Laura [name] is a 63 y.o. female presenting today for follow up of  Chief Complaint  Patient presents with   Asthma    Weather causes flare ups which she is currently experiencing.     Laura [name] has a history of the following: Patient Active Problem List   Diagnosis Date Noted   Insulin long-term use (HCC) 03/08/2023   Inflammation of right ilioinguinal nerve 01/30/2023   Acquired hallux valgus of right foot 07/29/2022   Hammer toe of second toe of right foot 07/29/2022   Joint contracture of foot, right 07/29/2022   Neuropathy 07/29/2022   Chest pain 02/08/2022   Syncope 02/07/2022   Class 2 severe obesity due to excess calories with serious comorbidity and body mass index (BMI) of 35.0 to 35.9 in adult [facility]) 01/14/2022   Acute cough 12/31/2021  Common variable immunodeficiency (HCC) 12/31/2021   Vitamin B12 deficiency 05/21/2021   Vitamin D deficiency 05/21/2021   Abdominal pain 04/27/2021   Urinary incontinence 04/27/2021   Dysphagia 04/27/2021   Constipation 04/27/2021   Mixed 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 (HCC) 04/10/2021   Gastroesophageal reflux disease 04/10/2021   Adnexal cyst 01/19/2021   Chronic RLQ pain 01/19/2021   Mixed stress and urge urinary incontinence 01/19/2021   Other fatigue 12/10/2020   Abdominal pain,  chronic, right lower quadrant 12/08/2020   Abnormal weight loss 12/08/2020   Poor appetite 12/08/2020   Severe persistent asthma with acute exacerbation 10/01/2019   Abnormal EKG    Chronic diastolic (congestive) heart failure (HCC) 08/29/2019   Shortness of breath 08/29/2019   Musculoskeletal chest pain 08/28/2019   Headache 08/10/2018   H/O atrial flutter 08/10/2018   OSA on CPAP 08/10/2018   Anxiety 08/10/2018   Uncontrolled type 2 diabetes mellitus with hyperglycemia (HCC) 03/13/2018   Hypothyroidism 03/13/2018   Essential hypertension, benign 03/13/2018   Mixed hyperlipidemia 03/13/2018   Hypogammaglobulinemia (HCC) 02/06/2018   Non-allergic rhinitis 02/06/2018   Severe persistent asthma, uncomplicated 02/06/2018   Pulmonary nodules 02/06/2018   Bronchiectasis without complication (HCC) 02/06/2018   Type 2 diabetes mellitus with hyperglycemia, with long-term current use of insulin (HCC) 02/06/2018    History obtained from: chart review and patient.  Discussed the use of AI scribe software for clinical note transcription with the patient and/or guardian, who gave verbal consent to proceed.  Laura [name] is a 63 y.o. female presenting for a follow up visit. She was last seen in July 2024. At that time, Laura [name] continued her on Cuvitru 27 grams every two weeks. For her NAR, we continued with Nasacort as well as azelastine as needed. For her asthma, her lung testing looked good. We continued with the use of Breo one puff once daily as well as montelukast and Fasenra. We continued with Xopenex as needed. For her reflux, we continued with dietary modifications as well as omeprazole BID.   Since the last visit, she has done well.   She reports that she has unintentional weight loss and fatigue. She reports a decrease in appetite since contracting COVID-19, with a lack of interest in food and a reduction in meal frequency. Despite this, she maintains a nutritious diet when she does eat. She has  lost a significant amount of weight, to the point where her clothes no longer fit properly. She has wanted to lose weight, but a lot of this is not intentional.  She previously had a CPAP but the most recent sleep study was normal.   Asthma/Respiratory Symptom History: She feels that her lung function appears to be good, with no breakthrough exacerbations.  She remains on her Breo 100 mcg 1 puff once daily as well as montelukast 10 mg daily.  She does self administer Fasenra every 8 weeks which seems to be working well to control her symptoms.  She has not been to the emergency room for any shortness of breath or wheezing.  She feels that her breathing is under good control.  Infection Symptom History: She remains on her infusions every 2 weeks.  She received 27 g every 2 weeks.  On this regimen, she has not needed any antibiotics at all since the last time we saw her.  Infusions are going well without any issues.  She self administers the infusions and denies any problems with the infusions.  Allergic Rhinitis Symptom History: She  remains on her Nasacort.  She also uses Astelin as needed.  She uses nasal saline occasionally.  Overall, her rhinitis symptoms are under good control.   GERD Symptom History: She remains on omeprazole twice daily for her reflux.  She is going to be getting an endoscopy soon to evaluate this chest pain and difficulty swallowing which was attributed to GERD.  She has had some family conflicts.  Her daughter who lives locally and is on the office with her for what ever reason.  Apparently this is a frequent problem.  She is getting along with her sister with whom she lives.  She has had some family conflicts.  Her daughter who lives locally and is on the office with her for what ever reason.  Apparently this is a frequent problem.  She is getting along with her sister with whom she lives.  The patient also mentions a recent abnormal blood work, with a drop in platelet count to  140 and a slight increase in hemoglobin. She has not been referred to Hematology yet.   Otherwise, there have been no changes to her past medical history, surgical history, family history, or social history.    Review of systems otherwise negative other than that mentioned in the HPI.    Objective:   Blood pressure 90/60, pulse 73, temperature 98.5 F (36.9 C), temperature source Temporal, resp. rate 14, height 5' 2.52" (1.588 m), weight 191 lb 6.4 oz (86.8 kg), SpO2 96%. Body mass index is 34.43 kg/m.    Physical Exam Vitals reviewed.  Constitutional:      Appearance: She is well-developed and overweight.     Comments: Very talkative. Generally seems fairly happy. She is somewhat less energetic than normal.   HENT:     Head: Normocephalic and atraumatic.     Right Ear: Tympanic membrane, ear canal and external ear normal.     Left Ear: Tympanic membrane, ear canal and external ear normal.     Nose: No nasal deformity, septal deviation, mucosal edema or rhinorrhea.     Right Turbinates: Enlarged, swollen and pale.     Left Turbinates: Enlarged, swollen and pale.     Right Sinus: No maxillary sinus tenderness or frontal sinus tenderness.     Left Sinus: No maxillary sinus tenderness or frontal sinus tenderness.     Comments: No nasal polyps noted.     Mouth/Throat:     Lips: Pink.     Mouth: Mucous membranes are moist. Mucous membranes are not pale and not dry.     Pharynx: Uvula midline.  Eyes:     General: Lids are normal. Allergic shiner present.        Right eye: No discharge.        Left eye: No discharge.     Conjunctiva/sclera: Conjunctivae normal.     Right eye: Right conjunctiva is not injected. No chemosis.    Left eye: Left conjunctiva is not injected. No chemosis.    Pupils: Pupils are equal, round, and reactive to light.  Cardiovascular:     Rate and Rhythm: Normal rate and regular rhythm.     Heart sounds: Normal heart sounds.  Pulmonary:     Effort:  Pulmonary effort is normal. No tachypnea, accessory muscle usage or respiratory distress.     Breath sounds: Normal breath sounds. No wheezing, rhonchi or rales.     Comments: Moving air well in all lung fields. No increased work of breathing noted.  Chest:  Chest wall: No tenderness.  Lymphadenopathy:     Cervical: No cervical adenopathy.  Skin:    Coloration: Skin is not pale.     Findings: No abrasion, erythema, petechiae or rash. Rash is not papular, urticarial or vesicular.  Neurological:     Mental Status: She is alert.     Comments: Hand tremors are much better than before.   Psychiatric:        Behavior: Behavior is cooperative.      Diagnostic studies:    Spirometry: results normal (FEV1: 1.92/84%, FVC: 2.79/97%, FEV1/FVC: 69%).    Spirometry consistent with normal pattern.   Allergy Studies: labs sent instead       Malachi Bonds, MD  [facility]

## 2023-10-25 NOTE — Telephone Encounter (Signed)
   Name: Laura [name]  DOB: 09-05-1961  MRN: [id number]  Primary Cardiologist: Armida Lander, MD   Preoperative team, please contact this patient and set up a phone call appointment for further preoperative risk assessment. Please obtain consent and complete medication review. Thank you for your help.  I confirm that guidance regarding antiplatelet and oral anticoagulation therapy has been completed and, if necessary, noted below.  CHA2DS2-VASc Score = 5   This indicates a 7.2% annual risk of stroke. The patient's score is based upon: CHF History: 1 HTN History: 0 Diabetes History: 1 Stroke History: 2 Vascular Disease History: 0 Age Score: 0 Gender Score: 1       CrCl >100 mL/min Platelet count 143 K     Per office protocol, patient can hold Eliquis  for 2 days prior to procedure  I also confirmed the patient resides in the state of [place] . As per [facility] Medical Board telemedicine laws, the patient must reside in the state in which the provider is licensed.   Friddie Jetty, NP 10/25/2023, 3:49 PM [facility]

## 2023-10-25 NOTE — Telephone Encounter (Signed)
 Patient with diagnosis of afib on Eliquis  for anticoagulation.    Procedure: Esophagogastroduodenoscopy (EGD) with esophageal dilation  Date of procedure: TBD   CHA2DS2-VASc Score = 5   This indicates a 7.2% annual risk of stroke. The patient's score is based upon: CHF History: 1 HTN History: 0 Diabetes History: 1 Stroke History: 2 Vascular Disease History: 0 Age Score: 0 Gender Score: 1     CrCl >100 mL/min Platelet count 143 K   Per office protocol, patient can hold Eliquis  for 2 days prior to procedure.     **This guidance is not considered finalized until pre-operative APP has relayed final recommendations.**

## 2023-10-25 NOTE — Telephone Encounter (Signed)
 Pre-operative cardiovascular risk assessment Laura [name]'s perioperative risk of a major cardiac event is 0.9% according to the Revised Cardiac Risk Index (RCRI).  Therefore, she is at low risk for perioperative complications.   Her functional capacity is excellent at > 4 METs according to the Duke Activity Status Index (DASI). Recommendations: According to ACC/AHA guidelines, no further cardiovascular testing needed.  The patient may proceed to surgery at acceptable risk.   Antiplatelet and/or Anticoagulation Recommendations: Eliquis  (Apixaban ) can be held for 2 days prior to surgery.  Please resume post op when felt to be safe.    Will route note back to pre-op pool.

## 2023-10-25 NOTE — Patient Instructions (Addendum)
 1. Hypogammaglobulinemia - Continue with Cuvitru .  - We will continue with 27 g every 2 weeks. - We might consider increasing the dosage if this frequency of infections worsen.  - We are going to get an IgG level as well.   2. Chronic rhinitis   - Continue with nasal saline rinses as needed.  - Continue with Nasacort  daily as instructed by your primary care provider.  - Refills sent in for azelastine .   3. Severe persistent asthma, uncomplicated - Lung testing looks excellent today - Daily medication: Continue Breo155mcg one puff once daily + montelukast  10 mg once a day + Fasenra  every 8 weeks - Prior to physical activity: Xopenex  2 puffs 10-15 minutes before physical activity. - Rescue medications:  Xopenex  nebulizer every 4-6 hours as needed, Xopenex  4 puffs every 4-6 hours as needed, or DuoNeb nebulizer one vial every 4-6 hours as needed - Asthma control goals:  * Full participation in all desired activities (may need albuterol  before activity) * Albuterol  use two time or less a week on average (not counting use with activity) * Cough interfering with sleep two time or less a month * Oral steroids no more than once a year * No hospitalizations.  4.  Reflux  - Continue dietary lifestyle modifications as listed below  - Continue omeprazole  twice a day to control reflux.    - Agree with plan for endoscopy.   5. Fatigue and loss of appetite - We are going to get some labs including vitamin B and vitamin D . - We are also going to get iron studies and thyroid  studies.  - Definitely tell Dr. Nida about this as well.   6. Return in about 3 months (around 01/23/2024). You can have the follow up appointment with Dr. [name] or a Nurse Practicioner (our Nurse Practitioners are excellent and always have Physician oversight!).    Please inform us  of any Emergency Department visits, hospitalizations, or changes in symptoms. Call us  before going to the ED for breathing or allergy symptoms  since we might be able to fit you in for a sick visit. Feel free to contact us  anytime with any questions, problems, or concerns.  It was a pleasure to see you again today!  Websites that have reliable patient information: 1. American Academy of Asthma, Allergy, and Immunology: www.aaaai.org 2. Food Allergy Research and Education (FARE): foodallergy.org 3. Mothers of Asthmatics: http://www.asthmacommunitynetwork.org 4. American College of Allergy, Asthma, and Immunology: www.acaai.org      "Like" us  on Facebook and Instagram for our latest updates!      A healthy democracy works best when [employer] participate! Make sure you are registered to vote! If you have moved or changed any of your contact information, you will need to get this updated before voting! Scan the QR codes below to learn more!

## 2023-10-26 DIAGNOSIS — D839 Common variable immunodeficiency, unspecified: Secondary | ICD-10-CM | POA: Diagnosis not present

## 2023-10-26 NOTE — Telephone Encounter (Signed)
   Patient Name: Laura [name]  DOB: 07-20-1961 MRN: [id number]  Primary Cardiologist: Dina Rich, MD  Chart reviewed as part of pre-operative protocol coverage.  Per Sharlene Dory, NP, who last saw the pt in office on 10/24/2023,  given past medical history and time since last visit, based on ACC/AHA guidelines, Laura [name] is at acceptable risk for the planned procedure without further cardiovascular testing.   Pre-operative cardiovascular risk assessment Laura [name]'s perioperative risk of a major cardiac event is 0.9% according to the Revised Cardiac Risk Index (RCRI).  Therefore, she is at low risk for perioperative complications.   Her functional capacity is excellent at > 4 METs according to the Duke Activity Status Index (DASI). Recommendations: According to ACC/AHA guidelines, no further cardiovascular testing needed.  The patient may proceed to surgery at acceptable risk.   Antiplatelet and/or Anticoagulation Recommendations: Eliquis (Apixaban) can be held for 2 days prior to surgery.  Please resume post op when felt to be safe.    I will route this recommendation to the requesting party via Epic fax function and remove from pre-op pool.  Please call with questions.  Joylene Grapes, NP 10/26/2023, 9:10 AM

## 2023-10-27 ENCOUNTER — Encounter: Payer: Self-pay | Admitting: Allergy & Immunology

## 2023-10-27 NOTE — Telephone Encounter (Signed)
Please advise. Thank you

## 2023-10-30 ENCOUNTER — Other Ambulatory Visit: Payer: Self-pay | Admitting: *Deleted

## 2023-10-30 ENCOUNTER — Other Ambulatory Visit: Payer: Self-pay | Admitting: Physician Assistant

## 2023-10-30 MED ORDER — LEVALBUTEROL HCL 1.25 MG/3ML IN NEBU: INHALATION_SOLUTION | RESPIRATORY_TRACT | 1 refills | Status: DC

## 2023-10-30 NOTE — Telephone Encounter (Signed)
LMTRC

## 2023-10-30 NOTE — Addendum Note (Signed)
Addended by: Robet Leu A on: 10/30/2023 10:08 AM   Modules accepted: Orders

## 2023-10-30 NOTE — Telephone Encounter (Signed)
Ok to schedule per previous orders. . Hold eliquis 48 hours. And making sure orders stated to hold jardiance 72 hours and trulicity 7 days.

## 2023-10-31 ENCOUNTER — Other Ambulatory Visit: Payer: Self-pay

## 2023-10-31 ENCOUNTER — Encounter: Payer: Self-pay | Admitting: *Deleted

## 2023-10-31 NOTE — Telephone Encounter (Signed)
[employer] PA:  Notification or Prior Authorization is not required for the requested services You are not required to submit a notification/prior authorization based on the information provided. If you have general questions about the prior authorization requirements, visit [email or web address] > Clinician Resources > Advance and Admission Notification Requirements. The number above acknowledges your notification. Please write this reference number down for future reference. If you would like to request an organization determination, please call us at [phone]. Decision ID #: [id number]

## 2023-10-31 NOTE — Telephone Encounter (Signed)
Pt has been scheduled for 11/27/23. Instructions mailed to pt

## 2023-10-31 NOTE — Telephone Encounter (Signed)
I ordered 4 months ago with 2 refills, so she should have had enough.Cannot refill

## 2023-11-03 ENCOUNTER — Other Ambulatory Visit: Payer: Self-pay | Admitting: *Deleted

## 2023-11-03 MED ORDER — LEVALBUTEROL HCL 1.25 MG/3ML IN NEBU: INHALATION_SOLUTION | RESPIRATORY_TRACT | 1 refills | Status: DC

## 2023-11-06 ENCOUNTER — Ambulatory Visit (INDEPENDENT_AMBULATORY_CARE_PROVIDER_SITE_OTHER): Payer: 59 | Admitting: Clinical

## 2023-11-06 DIAGNOSIS — F4310 Post-traumatic stress disorder, unspecified: Secondary | ICD-10-CM

## 2023-11-06 NOTE — Progress Notes (Signed)
Virtual Visit via Video Note   I connected with Laura [name] on 11/06/2023 at  11:00 AM EST by a video enabled telemedicine application and verified that I am speaking with the correct person using two identifiers.   Location: Patient: Home Provider: Office   I discussed the limitations of evaluation and management by telemedicine and the availability of in person appointments. The patient expressed understanding and agreed to proceed.   THERAPIST PROGRESS NOTE   Session Time: 11:00 AM-:11:45 AM   Participation Level: Active   Behavioral Response: CasualAlertDepressed   Type of Therapy: Individual Therapy   Treatment Goals addressed: Coping/ non-medication treatment therapy for PTSD.   Interventions: CBT, DBT, Solution Focused, Strength-based and Supportive   Summary: Laura [name] is a 63 y.o. female who presents with PTSD. The OPT therapist worked with the patient for her ongoing OPT treatment session. The OPT therapist utilized Motivational Interviewing to assist in creating therapeutic repore.The patient in the session was engaged and work in collaboration giving feedback about her triggers and symptoms over the past few weeks post prior hospital visit and into the beginning of the new year through January .The patient was engaged throughout the session. The patient spoke about her ongoing interactions with [name] her middle daughter who she has had a pattern of conflict and this was a point of stress .The OPT therapist utilized Cognitive Behavioral Therapy through cognitive restructuring as well as worked with the patient on coping strategies to assist in management of mental health symptoms. The OPT therapist worked with the patient on consistency in getting back to her health baseline and working to be consistent with her management of her external stressors with consistency especially around her interactions with other family members. The OPT therapist overviewed with the patient  her basic health areas including sleep, eating, exercise, and hygiene.The OPT therapist placed emphasis on the patient keeping all upcoming health appointments and adhering to ongoing health treatment recommendations. The OPT therapist reviewed all appointments listed coming up in the patients MyChart.   Suicidal/Homicidal: Nowithout intent/plan   Therapist Response:The OPT therapist worked with the patient for the patients scheduled session. The patient was engaged in her session and gave feedback in relation to triggers, symptoms, and behavior responses over the past few weeks. The patient spoke about interactions with family over the course of the past few weeks. The OPT therapist worked with the patient utilizing an in session Cognitive Behavioral Therapy exercise. The OPT therapist worked with the patient on the impact of her interactions with her middle daughter Laura [name]. The OPT therapist worked with the patient on getting to and maintaining baseline thought consistency in covering her basic needs.The patient has been working with her physical health providers to manage physical health conditions. The patient spoke about her mood and concern and focus on her health moving forward with intent to follow up with her health providers. The patient spoke about talking with a new guy who is now her boyfriend from the [place] who after only talking a month wants to get married (scam risk). The OPT therapist worked with the patient overviewing upcoming appointments as listed in the patients Mychart.The OPT therapist will continue treatment work with the patient in her next scheduled session.   Plan: Return again in 2/3 weeks.   Diagnosis:      Axis I: Post Traumatic Stress Disorder  Axis II: No diagnosis     Collaboration of Care: Overview with the patient of involvement in the Med Management program with [name].   Patient/Guardian was advised Release of Information must be  obtained prior to any record release in order to collaborate their care with an outside provider. Patient/Guardian was advised if they have not already done so to contact the registration department to sign all necessary forms in order for Laura [name] to release information regarding their care.    Consent: Patient/Guardian gives verbal consent for treatment and assignment of benefits for services provided during this visit. Patient/Guardian expressed understanding and agreed to proceed.      I discussed the assessment and treatment plan with the patient. The patient was provided an opportunity to ask questions and all were answered. The patient agreed with the plan and demonstrated an understanding of the instructions.   The patient was advised to call back or seek an in-person evaluation if the symptoms worsen or if the condition fails to improve as anticipated.   I provided 45 minutes of non-face-to-face time during this encounter.   Suzan Garibaldi, LCSW    11/06/2023

## 2023-11-09 DIAGNOSIS — D839 Common variable immunodeficiency, unspecified: Secondary | ICD-10-CM | POA: Diagnosis not present

## 2023-11-10 ENCOUNTER — Other Ambulatory Visit: Payer: Self-pay | Admitting: Physician Assistant

## 2023-11-10 DIAGNOSIS — R251 Tremor, unspecified: Secondary | ICD-10-CM

## 2023-11-10 MED ORDER — TOPIRAMATE 100 MG PO TABS: ORAL_TABLET | ORAL | 0 refills | Status: DC

## 2023-11-10 NOTE — Telephone Encounter (Signed)
Called patient and asked what dosage of Topamax she is on? Patient stated that she is on 150 mg in the AM and 100 mg in the PM. Patient also wanted to let us know that [facility] does not dispense 50 mg tablets.   Patient is aware that she must keep her follow up for any further refills. Patient verbalized understanding and had no further questions or concerns.

## 2023-11-10 NOTE — Telephone Encounter (Signed)
Please call patient back about medication   She states she needs her  topamax  50 mg and she called last week and has not heard anything about this . She is out of medication   She uses the [employer]

## 2023-11-13 ENCOUNTER — Other Ambulatory Visit: Payer: Self-pay | Admitting: Allergy & Immunology

## 2023-11-15 ENCOUNTER — Other Ambulatory Visit (HOSPITAL_COMMUNITY)
Admission: RE | Admit: 2023-11-15 | Discharge: 2023-11-15 | Disposition: A | Discharge status: Home / Self Care | Payer: 59 | Source: Ambulatory Visit | Attending: "Endocrinology | Admitting: "Endocrinology

## 2023-11-15 ENCOUNTER — Other Ambulatory Visit (HOSPITAL_COMMUNITY)
Admission: RE | Admit: 2023-11-15 | Discharge: 2023-11-15 | Disposition: A | Discharge status: Home / Self Care | Payer: 59 | Source: Ambulatory Visit | Attending: Allergy & Immunology | Admitting: Allergy & Immunology

## 2023-11-15 DIAGNOSIS — I10 Essential (primary) hypertension: Secondary | ICD-10-CM | POA: Insufficient documentation

## 2023-11-15 DIAGNOSIS — Z794 Long term (current) use of insulin: Secondary | ICD-10-CM | POA: Insufficient documentation

## 2023-11-15 DIAGNOSIS — E785 Hyperlipidemia, unspecified: Secondary | ICD-10-CM | POA: Diagnosis not present

## 2023-11-15 DIAGNOSIS — E559 Vitamin D deficiency, unspecified: Secondary | ICD-10-CM | POA: Insufficient documentation

## 2023-11-15 DIAGNOSIS — E1151 Type 2 diabetes mellitus with diabetic peripheral angiopathy without gangrene: Secondary | ICD-10-CM | POA: Insufficient documentation

## 2023-11-15 DIAGNOSIS — R5383 Other fatigue: Secondary | ICD-10-CM | POA: Insufficient documentation

## 2023-11-15 DIAGNOSIS — E039 Hypothyroidism, unspecified: Secondary | ICD-10-CM | POA: Insufficient documentation

## 2023-11-15 DIAGNOSIS — Z7984 Long term (current) use of oral hypoglycemic drugs: Secondary | ICD-10-CM | POA: Insufficient documentation

## 2023-11-15 DIAGNOSIS — E1165 Type 2 diabetes mellitus with hyperglycemia: Secondary | ICD-10-CM | POA: Insufficient documentation

## 2023-11-15 DIAGNOSIS — E782 Mixed hyperlipidemia: Secondary | ICD-10-CM | POA: Insufficient documentation

## 2023-11-15 LAB — T4, FREE: Free T4: 0.91 ng/dL (ref 0.61–1.12)

## 2023-11-15 LAB — IRON AND TIBC
Iron: 105 ug/dL (ref 28–170)
Saturation Ratios: 36 % — ABNORMAL HIGH (ref 10.4–31.8)
TIBC: 296 ug/dL (ref 250–450)
UIBC: 191 ug/dL

## 2023-11-15 LAB — LIPID PANEL
Cholesterol: 147 mg/dL (ref 0–200)
HDL: 42 mg/dL
LDL Cholesterol: 76 mg/dL (ref 0–99)
Total CHOL/HDL Ratio: 3.5 ratio
Triglycerides: 147 mg/dL
VLDL: 29 mg/dL (ref 0–40)

## 2023-11-15 LAB — COMPREHENSIVE METABOLIC PANEL
ALT: 16 U/L (ref 0–44)
AST: 17 U/L (ref 15–41)
Albumin: 3.7 g/dL (ref 3.5–5.0)
Alkaline Phosphatase: 55 U/L (ref 38–126)
Anion gap: 10 (ref 5–15)
BUN: 18 mg/dL (ref 8–23)
CO2: 20 mmol/L — ABNORMAL LOW (ref 22–32)
Calcium: 9 mg/dL (ref 8.9–10.3)
Chloride: 110 mmol/L (ref 98–111)
Creatinine, Ser: 0.58 mg/dL (ref 0.44–1.00)
GFR, Estimated: >60 mL/min (ref >60)
Glucose, Bld: 180 mg/dL — ABNORMAL HIGH (ref 70–99)
Potassium: 4.3 mmol/L (ref 3.5–5.1)
Sodium: 140 mmol/L (ref 135–145)
Total Bilirubin: 0.4 mg/dL (ref 0.0–1.2)
Total Protein: 7.3 g/dL (ref 6.5–8.1)

## 2023-11-15 LAB — TSH: TSH: 1.047 u[IU]/mL (ref 0.350–4.500)

## 2023-11-15 LAB — FERRITIN: Ferritin: 110 ng/mL (ref 11–307)

## 2023-11-15 LAB — VITAMIN B12: Vitamin B-12: 545 pg/mL (ref 180–914)

## 2023-11-15 LAB — FOLATE: Folate: 11.1 ng/mL (ref >5.9)

## 2023-11-15 LAB — VITAMIN D 25 HYDROXY (VIT D DEFICIENCY, FRACTURES): Vit D, 25-Hydroxy: 87.53 ng/mL (ref 30–100)

## 2023-11-16 ENCOUNTER — Ambulatory Visit (INDEPENDENT_AMBULATORY_CARE_PROVIDER_SITE_OTHER): Payer: 59 | Admitting: "Endocrinology

## 2023-11-16 ENCOUNTER — Encounter: Payer: Self-pay | Admitting: "Endocrinology

## 2023-11-16 ENCOUNTER — Ambulatory Visit: Payer: 59 | Admitting: Nutrition

## 2023-11-16 VITALS — BP 138/62 | HR 76 | Ht 62.5 in | Wt 189.2 lb

## 2023-11-16 DIAGNOSIS — Z794 Long term (current) use of insulin: Secondary | ICD-10-CM

## 2023-11-16 DIAGNOSIS — E039 Hypothyroidism, unspecified: Secondary | ICD-10-CM

## 2023-11-16 DIAGNOSIS — E1165 Type 2 diabetes mellitus with hyperglycemia: Secondary | ICD-10-CM

## 2023-11-16 DIAGNOSIS — E559 Vitamin D deficiency, unspecified: Secondary | ICD-10-CM | POA: Diagnosis not present

## 2023-11-16 DIAGNOSIS — E782 Mixed hyperlipidemia: Secondary | ICD-10-CM

## 2023-11-16 LAB — POCT GLYCOSYLATED HEMOGLOBIN (HGB A1C)

## 2023-11-16 LAB — MISC LABCORP TEST (SEND OUT): Labcorp test code: 330015

## 2023-11-16 LAB — THYROID PEROXIDASE ANTIBODY: Thyroperoxidase Ab SerPl-aCnc: 43 [IU]/mL — ABNORMAL HIGH (ref 0–34)

## 2023-11-16 MED ORDER — LANTUS SOLOSTAR 100 UNIT/ML ~~LOC~~ SOPN: 30.0000 [IU] | PEN_INJECTOR | Freq: Every day | SUBCUTANEOUS | 1 refills | Status: DC

## 2023-11-16 NOTE — Progress Notes (Signed)
 11/16/2023, 10:23 AM   Endocrinology follow-up note   Subjective:    Patient ID: Laura [name], female    DOB: 10/10/1961.  Laura [name] was seen since 2019 for management of diabetes, also seen during her last visit in relation to her steroid exposure to rule out adrenal insufficiency.  PMD: Shona Norleen PEDLAR, MD.   Past Medical History:  Diagnosis Date   Anemia    Aortic atherosclerosis (HCC)    Asthma    Atrial fibrillation and flutter (HCC)    a.) CHA2DS2VASc = 6 (sex, HTN, TIA x2, vascular disease history, T2DM);  b.) DCCV ~ 2009; c.) DCCV 08/29/2018 (200J x1 --> SR with 1st degree AVB); d.) rate/rhythm maintained on oral diltiazem  + bisoprolol ; chronically anticoagulated with apixaban    Bipolar disorder (HCC)    Carpal tunnel syndrome of right wrist    CHB (complete heart block) (HCC)    a.) s/p St. Jude dual chamber PPM placement   CHF (congestive heart failure) (HCC)    Complication of anesthesia    a.) allergic reaction to perioperative analgesics + anesthetic medications in [place] ; unsure of combination; not able to recount reactions/events --> states I ended up in the ICU for 5 days   Constipation    a.) on linaclotide    COPD (chronic obstructive pulmonary disease) (HCC)    DDD (degenerative disc disease), thoracic    GERD (gastroesophageal reflux disease)    H/O congenital atrial septal defect (ASD) repair    Hallux valgus of right foot    Hammertoe of second toe of right foot    History of 2019 novel coronavirus disease (COVID-19)    a.) 10/2020; b.) 05/2021   HLD (hyperlipidemia)    HTN (hypertension)    Hypogammaglobulinemia (HCC)    a.) on Curitru   Hypothyroid    IgE deficiency (HCC)    Long term current use of anticoagulant    a.) apixaban    Lumbar stenosis    Migraines    Mitral stenosis 02/08/2022   a.) TTE 02/08/2022: EF 60-65%, mild MAC, mild MV stenosis (peak  grad 14.6 / mean grad 4.0)   Neuropathy    NSVT (nonsustained ventricular tachycardia) (HCC) 10/31/2018   a.) Holter 10/31/2018: 6 beat run   Obesity    OSA on CPAP    Osteoarthritis    Presence of permanent cardiac 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 Jude dual chamber device; placed for symptomatic bradycardia secondary to intermittent CHB   PTSD (post-traumatic stress disorder)    Pulmonary nodules    Recurrent sinusitis    Short-term memory loss    Sleep difficulties    a.) takes melatonin   Symptomatic bradycardia    a.) s/p St. Jude dual chamber PPM placement   Syncope    TIA (transient ischemic attack)    x3-last one in 2015   Tremors of nervous system    Type 2 diabetes mellitus (HCC)    Past Surgical History:  Procedure Laterality Date   ASD REPAIR  1968   BIOPSY  01/26/2021   Procedure: BIOPSY;  Surgeon: Cindie Carlin POUR, DO;  [facility]: [facility];  Service: Endoscopy;;  gastric  CARDIOVERSION  2009   CARDIOVERSION N/A 08/29/2018   Procedure: CARDIOVERSION;  Surgeon: Alvan Dorn FALCON, MD;  Location: [facility];  Service: Endoscopy;  Laterality: N/A;   CARPAL TUNNEL RELEASE Bilateral    CESAREAN SECTION  1986, 1988, 1994   COLONOSCOPY WITH PROPOFOL  N/A 01/26/2021   Procedure: COLONOSCOPY WITH PROPOFOL ;  Surgeon: Cindie Carlin POUR, DO;  [facility]: [facility];  Service: Endoscopy;  Laterality: N/A;  am appt, diabetic   ESOPHAGOGASTRODUODENOSCOPY (EGD) WITH PROPOFOL  N/A 01/26/2021   Procedure: ESOPHAGOGASTRODUODENOSCOPY (EGD) WITH PROPOFOL ;  Surgeon: Cindie Carlin POUR, DO;  [facility]: [facility];  Service: Endoscopy;  Laterality: N/A;   HALLUX VALGUS AUSTIN Right 07/29/2022   Procedure: HALLUX VALGUS;  Surgeon: Silva Juliene SAUNDERS, DPM;  Location: [facility];  Service: Podiatry;  Laterality: Right;   HAMMER TOE SURGERY Right 07/29/2022   Procedure: HAMMER TOE CORRECTION 2ND;  Surgeon: Silva Juliene SAUNDERS, DPM;  Location: [facility];  Service: Podiatry;  Laterality: Right;    LEFT HEART CATH AND CORONARY ANGIOGRAPHY N/A 08/30/2019   Procedure: LEFT HEART CATH AND CORONARY ANGIOGRAPHY;  Surgeon: Dann Candyce RAMAN, MD;  Location: [facility] INVAS[facility] CV [facility];  Service: Cardiovascular;  Laterality: N/A;   NASAL SINUS SURGERY  2017   PACEMAKER IMPLANT N/A 03/03/2022   Procedure: PACEMAKER IMPLANT;  Surgeon: Waddell Danelle ORN, MD;  Location: [facility] CV LAB;  Service: Cardiovascular;  Laterality: N/A;   POLYPECTOMY  01/26/2021   Procedure: POLYPECTOMY;  Surgeon: Cindie Carlin POUR, DO;  [facility]: [facility];  Service: Endoscopy;;  colon   TOOTH EXTRACTION N/A 07/04/2023   Procedure: DENTAL RESTORATION/EXTRACTIONS;  Surgeon: Sheryle Hamilton, DMD;  Location: [facility] OR;  Service: Oral Surgery;  Laterality: N/A;   Social History   Socioeconomic History   Marital status: Widowed    Spouse name: Not on file   Number of children: 3   Years of education: 10   Highest education level: Not on file  Occupational History   Not on file  Tobacco Use   Smoking status: Former    Current packs/day: 0.00    Average packs/day: 1 pack/day for 28.0 years (28.0 ttl pk-yrs)    Types: Cigarettes    Start date: 27    Quit date: 2008    Years since quitting: 17.1   Smokeless tobacco: Never  Vaping Use   Vaping status: Never Used  Substance and Sexual Activity   Alcohol use: Not Currently   Drug use: Not Currently   Sexual activity: Not Currently    Birth control/protection: Post-menopausal  Other Topics Concern   Not on file  Social History Narrative   Right handed   Drinks caffeine   One story home   Social Drivers of Health   Financial Resource Strain: High Risk (01/06/2022)   Overall Financial Resource Strain (CARDIA)    Difficulty of Paying Living Expenses: Hard  Food Insecurity: No Food Insecurity (01/06/2022)   Hunger Vital Sign    Worried About Running Out of Food in the Last Year: Never true    Ran Out of Food in the Last Year: Never true  Transportation Needs: No  Transportation Needs (01/06/2022)   PRAPARE - Administrator, Civil Service (Medical): No    Lack of Transportation (Non-Medical): No  Physical Activity: Insufficiently Active (01/06/2022)   Exercise Vital Sign    Days of Exercise per Week: 4 days    Minutes of Exercise per Session: 30 min  Stress: No Stress Concern Present (01/06/2022)  [employer] of Occupational Health - Occupational Stress Questionnaire    Feeling of Stress : Only a little  Social Connections: Moderately Integrated (01/06/2022)   Social Connection and Isolation Panel [NHANES]    Frequency of Communication with Friends and Family: More than three times a week    Frequency of Social Gatherings with Friends and Family: More than three times a week    Attends Religious Services: More than 4 times per year    Active Member of [employer] or Organizations: Yes    Attends Banker Meetings: More than 4 times per year    Marital Status: Widowed   Outpatient Encounter Medications as of 11/16/2023  Medication Sig   atorvastatin  (LIPITOR) 20 MG tablet Take 20 mg by mouth at bedtime.   Azelastine  HCl 137 MCG/SPRAY SOLN Place 1 spray into both nostrils daily.   BD PEN NEEDLE NANO 2ND GEN 32G X 4 MM MISC 1 each by Other route in the morning, at noon, in the evening, and at bedtime.   bisoprolol  (ZEBETA ) 10 MG tablet Take 1 tablet (10 mg total) by mouth daily.   blood glucose meter kit and supplies 1 each by Other route 4 (four) times daily. Dispense based on patient and insurance preference. Use up to four times daily as directed. (FOR ICD-10 E10.9, E11.9).   Cholecalciferol (VITAMIN D3) 125 MCG (5000 UT) CAPS Take 1 capsule (5,000 Units total) by mouth daily.   Continuous Glucose Sensor (FREESTYLE LIBRE 3 SENSOR) MISC 1 Piece by Does not apply route every 14 (fourteen) days. Place 1 sensor on the skin every 14 days. Use to check glucose continuously   diltiazem  (CARDIZEM  CD) 300 MG 24 hr capsule Take 1 capsule  (300 mg total) by mouth daily.   divalproex  (DEPAKOTE  ER) 500 MG 24 hr tablet Take 1 tablet (500 mg total) by mouth daily AND 2 tablets (1,000 mg total) at bedtime.   Dulaglutide  (TRULICITY ) 4.5 MG/0.5ML SOAJ Inject 4.5 mg into the skin once a week.   ELDERBERRY PO Take 4 g by mouth daily. Gummies 2g each   ELIQUIS  5 MG TABS tablet TAKE ONE TABLET BY MOUTH TWICE DAILY @ 9AM & 5PM   empagliflozin  (JARDIANCE ) 10 MG TABS tablet Take 1 tablet (10 mg total) by mouth daily before breakfast.   EPINEPHrine  0.3 mg/0.3 mL IJ SOAJ injection Inject 0.3 mg into the muscle once as needed for anaphylaxis.   FASENRA  PEN 30 MG/ML SOAJ INJECT 30MG  SUBCUTANEOUSLY EVERY 8 WEEKS   FERROUS SULFATE  PO Take 65 mg by mouth every evening.   fluticasone  furoate-vilanterol (BREO ELLIPTA ) 100-25 MCG/ACT AEPB Inhale 1 puff into the lungs daily.   gabapentin  (NEURONTIN ) 600 MG tablet Take 600 mg by mouth 3 (three) times daily.   hydrOXYzine  (ATARAX ) 25 MG tablet TAKE ONE TABLET BY MOUTH THREE TIMES DAILY AS NEEDED FOR ANXIETY OR FOR ITCHING   Immune Globulin , Human, (CUVITRU  [place]) Inject 27 mg into the skin every 14 (fourteen) days.   insulin  glargine (LANTUS  SOLOSTAR) 100 UNIT/ML Solostar Pen Inject 30 Units into the skin at bedtime.   Insulin  Pen Needle (PEN NEEDLES 3/16) 31G X 5 MM MISC Use as directed with insulin  pen   levalbuterol  (XOPENEX  HFA) 45 MCG/ACT inhaler USE 2 INHALATIONS BY MOUTH EVERY 6 HOURS AS NEEDED FOR WHEEZING   levalbuterol  (XOPENEX ) 1.25 MG/3ML nebulizer solution Use 1 vial in the nebulizer every 6 hours as needed for cough, wheeze, shortness of breath or chest tightness.   levothyroxine  (SYNTHROID ,  LEVOTHROID) 112 MCG tablet Take 112 mcg by mouth daily before breakfast.   linaclotide  (LINZESS ) 290 MCG CAPS capsule Take 1 capsule (290 mcg total) by mouth daily before breakfast.   losartan  (COZAAR ) 50 MG tablet Take 50 mg by mouth every morning.   MELATONIN GUMMIES PO Take 10 mg by mouth at bedtime.    metFORMIN  (GLUCOPHAGE ) 500 MG tablet Take 500 mg by mouth 2 (two) times daily.   montelukast  (SINGULAIR ) 10 MG tablet Take 1 tablet (10 mg total) by mouth at bedtime.   Multiple Vitamins-Minerals (HAIR SKIN & NAILS PO) Take 1 tablet by mouth daily.   Multiple Vitamins-Minerals (MULTIVITAMIN WITH MINERALS) tablet Take 1 tablet by mouth daily. Woman   Nebulizer MISC 1 Device by Other route as directed. Nebulizer tubing kit   ondansetron  (ZOFRAN -ODT) 8 MG disintegrating tablet Take 8 mg by mouth daily as needed for vomiting or nausea.   ONETOUCH ULTRA test strip 1 each by Other route in the morning, at noon, in the evening, and at bedtime.   pantoprazole  (PROTONIX ) 40 MG tablet Take 1 tablet (40 mg total) by mouth 2 (two) times daily before a meal.   polyethylene glycol (MIRALAX  / GLYCOLAX ) 17 g packet Take 17 g by mouth daily as needed. (Patient taking differently: Take 17 g by mouth daily as needed for moderate constipation or severe constipation.)   Potassium Chloride  ER 20 MEQ TBCR Take 20 mEq by mouth daily.   sertraline  (ZOLOFT ) 25 MG tablet Take 1 tablet (25 mg total) by mouth at bedtime.   Spacer/Aero-Holding Chambers DEVI 1 Device by Does not apply route as directed.   topiramate  (TOPAMAX ) 100 MG tablet Take one tablet and a half (150 mg) in the morning and one tablet (100 mg) in the evening.   torsemide  (DEMADEX ) 20 MG tablet Take 1 tablet (20 mg total) by mouth daily as needed (Fluid).   [DISCONTINUED] insulin  glargine (LANTUS  SOLOSTAR) 100 UNIT/ML Solostar Pen Inject 40 Units into the skin at bedtime. (Patient taking differently: Inject 20 Units into the skin at bedtime.)   No facility-administered encounter medications on file as of 11/16/2023.    ALLERGIES: Allergies  Allergen Reactions   Ativan [Lorazepam] Hives   Phenergan [Promethazine Hcl] Hives    VACCINATION STATUS: Immunization History  Administered Date(s) Administered   Influenza Split 08/24/2018   Influenza Whole  07/15/2019   Influenza,inj,Quad PF,6+ Mos 07/10/2017, 10/27/2021   Moderna Sars-Covid-2 Vaccination 12/31/2019, 01/30/2020   Pneumococcal-Unspecified 10/10/2012    Diabetes She presents for her follow-up diabetic visit. She has type 2 diabetes mellitus. Onset time: She was diagnosed at approximate age of 31 years, stated by gestational diabetes with all 3 of her pregnancies in her 13s. Her disease course has been worsening (Since her last visit, she fell and injured herself.  CT scan of the head did not show any intracranial bleeding.SABRA). There are no hypoglycemic associated symptoms. Pertinent negatives for hypoglycemia include no confusion, headaches, pallor or seizures. Pertinent negatives for diabetes include no blurred vision, no chest pain, no fatigue, no polydipsia, no polyphagia and no polyuria. There are no hypoglycemic complications. Symptoms are worsening. Diabetic complications include a CVA and heart disease. (She has history of open heart surgery for ASD in 1965.) Risk factors for coronary artery disease include dyslipidemia, diabetes mellitus, family history, obesity, hypertension, post-menopausal, sedentary lifestyle and tobacco exposure. Current diabetic treatments: She is currently on Trulicity  4.5 mg subcutaneously weekly, metformin  500 mg p.o. twice daily, Jardiance  10 mg p.o. daily, and  Lantus  20 units nightly. Her weight is fluctuating minimally. She is following a generally unhealthy diet. When asked about meal planning, she reported none. She has not had a previous visit with a dietitian. She rarely (She has medical problem with bronchiectasis for which she gets treated with steroids on and off.) participates in exercise. Her home blood glucose trend is increasing steadily. Her breakfast blood glucose range is generally 180-200 mg/dl. Her lunch blood glucose range is generally 180-200 mg/dl. Her dinner blood glucose range is generally 180-200 mg/dl. Her bedtime blood glucose range is  generally 180-200 mg/dl. Her overall blood glucose range is 180-200 mg/dl. (Laura [name] presents with her CGM device showing slight worsening of her profile.  Her AGP report shows 52% time range, 40% level 1 hyperglycemia, 8% level 2 hyperglycemia.  Her average blood glucose is 183 mg per DL.  Her point-of-care A1c is 7.6% increasing from 7% during her last visit.    She did not document or report significant hypoglycemia.   ) An ACE inhibitor/angiotensin II receptor blocker is being taken.  Hyperlipidemia This is a chronic problem. The current episode started more than 1 year ago. Exacerbating diseases include diabetes and obesity. Associated symptoms include myalgias. Pertinent negatives include no chest pain or shortness of breath. Current antihyperlipidemic treatment includes statins. Risk factors for coronary artery disease include dyslipidemia, diabetes mellitus, family history, obesity, hypertension, a sedentary lifestyle and post-menopausal.  Hypertension This is a chronic problem. The current episode started more than 1 year ago. The problem is uncontrolled. Pertinent negatives include no blurred vision, chest pain, headaches, palpitations or shortness of breath. Risk factors for coronary artery disease include dyslipidemia, diabetes mellitus, obesity, sedentary lifestyle and smoking/tobacco exposure. Past treatments include ACE inhibitors. Hypertensive end-organ damage includes CVA.  Other The current episode started more than 1 month ago. The problem occurs constantly. Progression since onset: Patient reports that she did have exposure to steroids related to back pain on 2 separate occasions between November 2000 21 and 2022.  Last exposure was related to her COVID infection.  She reports that she developed fatigueced that she developed fatigue. Associated symptoms include myalgias. Pertinent negatives include no arthralgias, chest pain, chills, coughing, fatigue, fever, headaches, nausea, rash  or vomiting. She has tried nothing for the symptoms. The treatment provided no relief.     Objective:    BP 138/62   Pulse 76   Ht 5' 2.5 (1.588 m)   Wt 189 lb 3.2 oz (85.8 kg)   BMI 34.05 kg/m   Wt Readings from Last 3 Encounters:  11/16/23 189 lb 3.2 oz (85.8 kg)  10/25/23 191 lb 6.4 oz (86.8 kg)  10/24/23 190 lb 12.8 oz (86.5 kg)         Latest Ref Rng & Units 11/15/2023   10:40 AM 10/06/2023    9:01 AM 10/03/2023    8:37 AM  CMP  Glucose 70 - 99 mg/dL 819  771  835   BUN 8 - 23 mg/dL 18  19  15    Creatinine 0.44 - 1.00 mg/dL 9.41  9.33  9.33   Sodium 135 - 145 mmol/L 140  138  139   Potassium 3.5 - 5.1 mmol/L 4.3  4.0  4.1   Chloride 98 - 111 mmol/L 110  108  110   CO2 22 - 32 mmol/L 20  20  22    Calcium  8.9 - 10.3 mg/dL 9.0  9.4  9.0   Total Protein 6.5 - 8.1 g/dL 7.3  7.9   Total Bilirubin 0.0 - 1.2 mg/dL 0.4   0.4   Alkaline Phos 38 - 126 U/L 55   64   AST 15 - 41 U/L 17   15   ALT 0 - 44 U/L 16   15    Lipid Panel     Component Value Date/Time   CHOL 147 11/15/2023 1040   CHOL 157 03/03/2023 1121   TRIG 147 11/15/2023 1040   HDL 42 11/15/2023 1040   HDL 44 03/03/2023 1121   CHOLHDL 3.5 11/15/2023 1040   VLDL 29 11/15/2023 1040   LDLCALC 76 11/15/2023 1040   LDLCALC 92 03/03/2023 1121   LABVLDL 21 03/03/2023 1121    Latest Reference Range & Units 07/27/22 16:18 12/01/22 00:00 03/03/23 11:21  TSH 0.450 - 4.500 uIU/mL 1.83 1.84 (E) 1.180  T4,Free(Direct) 0.82 - 1.77 ng/dL   8.69  (E): External lab result   Assessment & Plan:  1. DM type 2 causing vascular disease (HCC)  Laura [name] presents with her CGM device showing slight worsening of her profile.  Her AGP report shows 52% time range, 40% level 1 hyperglycemia, 8% level 2 hyperglycemia.  Her average blood glucose is 183 mg per DL.  Her point-of-care A1c is 7.6% increasing from 7% during her last visit.    She did not document or report significant hypoglycemia.  -her diabetes is complicated by  CVA, CHF, history of open heart surgery for ASD, obesity/sedentary life, history of chronic smoking with bronchiectasis and Laura [name] remains at a high risk for more acute and chronic complications which include CAD, CVA, CKD, retinopathy, and neuropathy. These are all discussed in detail with the patient.  - I have counseled her on diet management and weight loss, by adopting a carbohydrate restricted/protein rich diet.  - she acknowledges that there is a room for improvement in her food and drink choices. - Suggestion is made for her to avoid simple carbohydrates  from her diet including Cakes, Sweet Desserts, Ice Cream, Soda (diet and regular), Sweet Tea, Candies, Chips, Cookies, Store Bought Juices, Alcohol , Artificial Sweeteners,  Coffee Creamer, and Sugar-free Products, Lemonade. This will help patient to have more stable blood glucose profile and potentially avoid unintended weight gain.  The following Lifestyle Medicine recommendations according to American College of Lifestyle Medicine  [facility]) were discussed and and offered to patient and she  agrees to start the journey:  A. Whole Foods, Plant-Based Nutrition comprising of fruits and vegetables, plant-based proteins, whole-grain carbohydrates was discussed in detail with the patient.   A list for source of those nutrients were also provided to the patient.  Patient will use only water or unsweetened tea for hydration. B.  The need to stay away from risky substances including alcohol, smoking; obtaining 7 to 9 hours of restorative sleep, at least 150 minutes of moderate intensity exercise weekly, the importance of healthy social connections,  and stress management techniques were discussed. C.  A full color page of  Calorie density of various food groups per pound showing examples of each food groups was provided to the patient.   - she is advised to stick to a routine mealtimes to eat 3 meals  a day and avoid unnecessary snacks ( to  snack only to correct hypoglycemia).   - she is following  with Penny Crumpton, RDN, CDE for individualized diabetes education.  - I have approached her with the following individualized plan to manage diabetes and patient agrees:   -  She continues to tolerate Jardiance .  I advised her to continue Jardiance  10 mg p.o. daily at breakfast. -She is advised to increase Lantus  back to 30 units nightly, advised to continue to utilize her CGM continuously.   -She is advised to continue Trulicity  4.5 mg subcutaneously weekly.  She is also on metformin  500 mg p.o. twice daily.  She is advised to call clinic for blood glucose readings above 200 mg per DL x3, or less than 70 mg per DL.     - Patient specific target  A1c;  LDL, HDL, Triglycerides,  were discussed in detail.  2.  BP/HTN:  -Her blood pressure is controlled to target.  She advised to continue her current blood pressure medications including hydralazine  25 mg 3 times daily.    3) Lipids/HPL: Her most recent lipid panel showed uncontrolled LDL at 92.  She is advised to continue atorvastatin  10 mg p.o. nightly.   Side effects and precautions discussed with her .  4)  Weight/Diet: She presents with weight loss, her BMI today is 34.05 improving from  37.39--still clearly complicating her diabetes care.  She is a candidate for modest weight loss.   The above discussed dietary plan will help achieve weight loss. -She is working with her dietitian.     5 hypothyroidism-long-standing, etiology unclear, she has multiple family members with thyroid  dysfunctions. Her previsit thyroid  function tests are consistent with appropriate replacement.  She is advised to continue levothyroxine  112 mcg p.o. daily before breakfast.     - We discussed about the correct intake of her thyroid  hormone, on empty stomach at fasting, with water, separated by at least 30 minutes from breakfast and other medications,  and separated by more than 4 hours from calcium ,  iron, multivitamins, acid reflux medications (PPIs). -Patient is made aware of the fact that thyroid  hormone replacement is needed for life, dose to be adjusted by periodic monitoring of thyroid  function tests.  6) vitamin D  deficiency: - She is currently on  vitamin D  supplements including vitamin D3 5000 units daily.     7) Chronic Care/Health Maintenance:  -she  is on  Statin medications and  is encouraged to continue to follow up with Ophthalmology, Dentist,  Podiatrist at least yearly or according to recommendations, and advised to  stay away from smoking. I have recommended yearly flu vaccine and pneumonia vaccination at least every 5 years;  and  sleep for at least 7 hours a day.  - I advised patient to maintain close follow up with Shona Norleen PEDLAR, MD for primary care needs.   I spent  41  minutes in the care of the patient today including review of labs from CMP, Lipids, Thyroid  Function, Hematology (current and previous including abstractions from other facilities); face-to-face time discussing  her blood glucose readings/logs, discussing hypoglycemia and hyperglycemia episodes and symptoms, medications doses, her options of short and long term treatment based on the latest standards of care / guidelines;  discussion about incorporating lifestyle medicine;  and documenting the encounter. Risk reduction counseling performed per USPSTF guidelines to reduce  obesity and cardiovascular risk factors.     Please refer to Patient Instructions for Blood Glucose Monitoring and Insulin /Medications Dosing Guide  in media tab for additional information. Please  also refer to  Patient Self Inventory in the Media  tab for reviewed elements of pertinent patient history.  Laura [name] participated in the discussions, expressed understanding, and voiced agreement with the above plans.  All questions were  answered to her satisfaction. she is encouraged to contact clinic should she have any questions or  concerns prior to her return visit.     Follow up plan: - Return in about 4 months (around 03/15/2024) for Bring Meter/CGM Device/Logs- A1c in Office.  Ranny Earl, MD [facility] [address] [place], [place] [address] Phone: [phone]  Fax: [phone]    11/16/2023, 10:23 AM  This note was partially dictated with voice recognition software. Similar sounding words can be transcribed inadequately or may not  be corrected upon review.

## 2023-11-16 NOTE — Patient Instructions (Signed)

## 2023-11-17 LAB — VITAMIN B6: Vitamin B6: 12.5 ug/L (ref 3.4–65.2)

## 2023-11-18 ENCOUNTER — Other Ambulatory Visit: Payer: Self-pay | Admitting: Cardiology

## 2023-11-20 LAB — VITAMIN B1: Vitamin B1 (Thiamine): 134.1 nmol/L (ref 66.5–200.0)

## 2023-11-20 LAB — IGG, IGA, IGM
IgA: 38 mg/dL — ABNORMAL LOW (ref 87–352)
IgG (Immunoglobin G), Serum: 1561 mg/dL (ref 586–1602)
IgM (Immunoglobulin M), Srm: 76 mg/dL (ref 26–217)

## 2023-11-21 ENCOUNTER — Ambulatory Visit (HOSPITAL_BASED_OUTPATIENT_CLINIC_OR_DEPARTMENT_OTHER): Payer: 59 | Admitting: Pulmonary Disease

## 2023-11-21 ENCOUNTER — Encounter (HOSPITAL_BASED_OUTPATIENT_CLINIC_OR_DEPARTMENT_OTHER): Payer: Self-pay

## 2023-11-23 ENCOUNTER — Other Ambulatory Visit: Payer: Self-pay

## 2023-11-23 ENCOUNTER — Encounter: Payer: Self-pay | Admitting: Allergy & Immunology

## 2023-11-23 ENCOUNTER — Encounter: Payer: Self-pay | Admitting: Internal Medicine

## 2023-11-23 ENCOUNTER — Encounter (HOSPITAL_COMMUNITY): Payer: Self-pay

## 2023-11-23 ENCOUNTER — Encounter (HOSPITAL_COMMUNITY)
Admission: RE | Admit: 2023-11-23 | Discharge: 2023-11-23 | Disposition: A | Discharge status: Home / Self Care | Payer: 59 | Source: Ambulatory Visit | Attending: Internal Medicine | Admitting: Internal Medicine

## 2023-11-23 DIAGNOSIS — D839 Common variable immunodeficiency, unspecified: Secondary | ICD-10-CM | POA: Diagnosis not present

## 2023-11-23 NOTE — Progress Notes (Signed)
PERIOPERATIVE PRESCRIPTION FOR IMPLANTED CARDIAC DEVICE PROGRAMMING  Patient Information: Name:  Laura [name]  DOB:  06/27/1961  MRN:  [id number]    Planned Procedure: EGD/ED Surgeon:  Dr. Earnest Bailey  Date of Procedure:  11/27/23  Cautery will be used.  Position during surgery:  left lateral   Device Information:  Clinic EP Physician:  Lewayne Bunting, MD   Device Type:  Pacemaker Manufacturer and Phone #:  [employer]: [phone] Pacemaker Dependent?:  No. Date of Last Device Check:  06/07/23 IN OFFICE / 08/22/23 REMOTE Normal Device Function?:  Yes.    Electrophysiologist's Recommendations:  Have magnet available. Provide continuous ECG monitoring when magnet is used or reprogramming is to be performed.  Procedure will likely interfere with device function. MAGNET should be used.   Per Device Clinic Standing Orders, Kizzie Ide, RN  3:58 PM 11/23/2023

## 2023-11-23 NOTE — Patient Instructions (Signed)
Laura [name]  11/23/2023     @PREFPERIOPPHARMACY @   Your procedure is scheduled on  11/27/2023.   Report to [facility] at  0700  A.M.    Call this number if you have problems the morning of surgery:  [phone]  If you experience any cold or flu symptoms such as cough, fever, chills, shortness of breath, etc. between now and your scheduled surgery, please notify us at the above number.   Remember:        Your last dose of trulicity should have been on 11/19/2023 or before.       Your last dose of jardiance should be on 11/23/2023.       Your last dose of eliquis should be on 11/24/2023.       Take 1/2 of your usual dose of glargine the night before your procedure.       Use your nebulizer and your inhalers before you come and bring your rescue inhaler with you.       DO NOT take any medications for diabetes the morning of your procedure.    Follow the diet instructions given to you by the office.    You may drink clear liquids until 0500 am on 11/27/2023.    Clear liquids allowed are:                    Water, Juice (No red color; non-citric and without pulp; diabetics please choose diet or no sugar options), Carbonated beverages (diabetics please choose diet or no sugar options), Clear Tea (No creamer, milk, or cream, including half & half and powdered creamer), Black Coffee Only (No creamer, milk or cream, including half & half and powdered creamer), and Clear Sports drink (No red color; diabetics please choose diet or no sugar options)    Take these medicines the morning of surgery with A SIP OF WATER       bisoprolol, diltiazem, depakote, gabapentin, hydroxyzine, levothyroxine, topiramate, zofran (if needed).     Do not wear jewelry, make-up or nail polish, including gel polish,  artificial nails, or any other type of covering on natural nails (fingers and  toes).  Do not wear lotions, powders, or perfumes, or deodorant.  Do not shave 48 hours prior to  surgery.  Men may shave face and neck.  Do not bring valuables to the hospital.  [facility] is not responsible for any belongings or valuables.  Contacts, dentures or bridgework may not be worn into surgery.  Leave your suitcase in the car.  After surgery it may be brought to your room.  For patients admitted to the hospital, discharge time will be determined by your treatment team.  Patients discharged the day of surgery will not be allowed to drive home and must have someone with them for 24 hours.    Special instructions:   DO NOT smoke tobacco or vape for 24 hours before your procedure.  Please read over the following fact sheets that you were given. Anesthesia Post-op Instructions and Care and Recovery After Surgery      Upper Endoscopy, Adult, Care After After the procedure, it is common to have a sore throat. It is also common to have: Mild stomach pain or discomfort. Bloating. Nausea. Follow these instructions at home: The instructions below may help you care for yourself at home. Your health care provider may give you more instructions. If you have questions, ask your health care provider. If  you were given a sedative during the procedure, it can affect you for several hours. Do not drive or operate machinery until your health care provider says that it is safe. If you will be going home right after the procedure, plan to have a responsible adult: Take you home from the hospital or clinic. You will not be allowed to drive. Care for you for the time you are told. Follow instructions from your health care provider about what you may eat and drink. Return to your normal activities as told by your health care provider. Ask your health care provider what activities are safe for you. Take over-the-counter and prescription medicines only as told by your health care provider. Contact a health care provider if you: Have a sore throat that lasts longer than one day. Have trouble  swallowing. Have a fever. Get help right away if you: Vomit blood or your vomit looks like coffee grounds. Have bloody, black, or tarry stools. Have a very bad sore throat or you cannot swallow. Have difficulty breathing or very bad pain in your chest or abdomen. These symptoms may be an emergency. Get help right away. Call 911. Do not wait to see if the symptoms will go away. Do not drive yourself to the hospital. Summary After the procedure, it is common to have a sore throat, mild stomach discomfort, bloating, and nausea. If you were given a sedative during the procedure, it can affect you for several hours. Do not drive until your health care provider says that it is safe. Follow instructions from your health care provider about what you may eat and drink. Return to your normal activities as told by your health care provider. This information is not intended to replace advice given to you by your health care provider. Make sure you discuss any questions you have with your health care provider. Document Revised: 01/05/2022 Document Reviewed: 01/05/2022 Elsevier Patient Education  2024 [employer].Esophageal Dilatation Esophageal dilatation, or dilation, is done to stretch a blocked or narrowed part of your esophagus. The esophagus is the part of your body that moves food from your mouth to your stomach. You may need to have it stretched if: You have a lot of scar tissue and it makes it hard or painful to swallow. You have cancer of the esophagus. There's a problem with how food moves through your esophagus. In some cases, you may need to have this procedure done more than once. Tell a health care provider about: Any allergies you have. All medicines you're taking, including vitamins, herbs, eye drops, creams, and over-the-counter medicines. Any problems you or family members have had with anesthesia. Any bleeding problems you have. Any surgeries you've had. Any medical conditions  you have. Whether you're pregnant or may be pregnant. What are the risks? Your health care provider will talk with you about risks. These may include: Bleeding. A hole or tear in your esophagus. What happens before the procedure? When to stop eating and drinking Follow instructions from your provider about what you may eat and drink. These may include: 8 hours before your procedure Stop eating most foods. Do not eat meat, fried foods, or fatty foods. Eat only light foods, such as toast or crackers. All liquids are okay except energy drinks and alcohol. 6 hours before your procedure Stop eating. Drink only clear liquids, such as water, clear fruit juice, black coffee, plain tea, and sports drinks. Do not drink energy drinks or alcohol. 2 hours before your procedure Stop  drinking all liquids. You may be allowed to take medicines with small sips of water. If you don't follow your provider's instructions, your procedure may be delayed or canceled. Medicines Ask your provider about: Changing or stopping your regular medicines. These include any diabetes medicines or blood thinners you take. Taking medicines such as aspirin and ibuprofen. These medicines can thin your blood. Do not take them unless your provider tells you to. Taking over-the-counter medicines, vitamins, herbs, and supplements. General instructions If you'll be going home right after the procedure, plan to have a responsible adult: Take you home from the hospital or clinic. You won't be allowed to drive. Care for you for the time you're told. What happens during the procedure? You may be given: A sedative. This helps you relax. Anesthesia. This keeps you from feeling pain. It will numb certain areas of your body. The stretching may be done with: Simple dilators. These are tools put in your esophagus to stretch it. Guide wires. These wires are put in using a tube called an endoscope. A dilator is put over the wires to  stretch your esophagus. Then the wires are taken out. A balloon. The balloon is on the end of a tube. It's inflated to stretch your esophagus. The procedure may vary among providers and hospitals. What can I expect after the procedure? Your blood pressure, heart rate, breathing rate, and blood oxygen level will be monitored until you leave the hospital or clinic. Your throat may feel sore and numb. This will get better over time. You won't be allowed to eat or drink until your throat is no longer numb. You may be able to go home when you can: Drink. Pee. Sit on the edge of the bed without nausea or dizziness. Follow these instructions at home: Activity If you were given a sedative during the procedure, it can affect you for several hours. Do not drive or operate machinery until your provider says it's safe. Return to your normal activities as told by your provider. Ask your provider what activities are safe for you. General instructions Take over-the-counter and prescription medicines only as told by your provider. Follow instructions from your provider about what you may eat and drink. Do not use any products that contain nicotine or tobacco. These products include cigarettes, chewing tobacco, and vaping devices, such as e-cigarettes. If you need help quitting, ask your provider. Keep all follow-up visits. Your provider will make sure the procedure worked. Where to find more information American Society for Gastrointestinal Endoscopy (ASGE): asge.org Contact a health care provider if: You have trouble swallowing. You have a fever. Your pain doesn't get better with medicine. Get help right away if: You have chest pain. You have trouble breathing. You vomit blood. Your poop is: Black. Tarry. Bloody. These symptoms may be an emergency. Get help right away. Call 911. Do not wait to see if the symptoms will go away. Do not drive yourself to the hospital. This information is not  intended to replace advice given to you by your health care provider. Make sure you discuss any questions you have with your health care provider. Document Revised: 12/23/2022 Document Reviewed: 12/23/2022 Elsevier Patient Education  2024 Elsevier Inc.Monitored Anesthesia Care, Care After The following information offers guidance on how to care for yourself after your procedure. Your health care provider may also give you more specific instructions. If you have problems or questions, contact your health care provider. What can I expect after the procedure? After the procedure,  it is common to have: Tiredness. Little or no memory about what happened during or after the procedure. Impaired judgment when it comes to making decisions. Nausea or vomiting. Some trouble with balance. Follow these instructions at home: For the time period you were told by your health care provider:  Rest. Do not participate in activities where you could fall or become injured. Do not drive or use machinery. Do not drink alcohol. Do not take sleeping pills or medicines that cause drowsiness. Do not make important decisions or sign legal documents. Do not take care of children on your own. Medicines Take over-the-counter and prescription medicines only as told by your health care provider. If you were prescribed antibiotics, take them as told by your health care provider. Do not stop using the antibiotic even if you start to feel better. Eating and drinking Follow instructions from your health care provider about what you may eat and drink. Drink enough fluid to keep your urine pale yellow. If you vomit: Drink clear fluids slowly and in small amounts as you are able. Clear fluids include water, ice chips, low-calorie sports drinks, and fruit juice that has water added to it (diluted fruit juice). Eat light and bland foods in small amounts as you are able. These foods include bananas, applesauce, rice, lean meats,  toast, and crackers. General instructions  Have a responsible adult stay with you for the time you are told. It is important to have someone help care for you until you are awake and alert. If you have sleep apnea, surgery and some medicines can increase your risk for breathing problems. Follow instructions from your health care provider about wearing your sleep device: When you are sleeping. This includes during daytime naps. While taking prescription pain medicines, sleeping medicines, or medicines that make you drowsy. Do not use any products that contain nicotine or tobacco. These products include cigarettes, chewing tobacco, and vaping devices, such as e-cigarettes. If you need help quitting, ask your health care provider. Contact a health care provider if: You feel nauseous or vomit every time you eat or drink. You feel light-headed. You are still sleepy or having trouble with balance after 24 hours. You get a rash. You have a fever. You have redness or swelling around the IV site. Get help right away if: You have trouble breathing. You have new confusion after you get home. These symptoms may be an emergency. Get help right away. Call 911. Do not wait to see if the symptoms will go away. Do not drive yourself to the hospital. This information is not intended to replace advice given to you by your health care provider. Make sure you discuss any questions you have with your health care provider. Document Revised: 02/21/2022 Document Reviewed: 02/21/2022 Elsevier Patient Education  2024 [employer].

## 2023-11-27 ENCOUNTER — Encounter (HOSPITAL_COMMUNITY)
Admission: RE | Disposition: A | Discharge status: Home / Self Care | Payer: Self-pay | Source: Home / Self Care | Attending: Internal Medicine

## 2023-11-27 ENCOUNTER — Ambulatory Visit (HOSPITAL_COMMUNITY): Payer: 59 | Admitting: Anesthesiology

## 2023-11-27 ENCOUNTER — Ambulatory Visit (HOSPITAL_COMMUNITY)
Admission: RE | Admit: 2023-11-27 | Discharge: 2023-11-27 | Disposition: A | Discharge status: Home / Self Care | Payer: 59 | Attending: Internal Medicine | Admitting: Internal Medicine

## 2023-11-27 ENCOUNTER — Encounter (HOSPITAL_COMMUNITY): Payer: Self-pay | Admitting: Internal Medicine

## 2023-11-27 DIAGNOSIS — Z87891 Personal history of nicotine dependence: Secondary | ICD-10-CM | POA: Insufficient documentation

## 2023-11-27 DIAGNOSIS — I5032 Chronic diastolic (congestive) heart failure: Secondary | ICD-10-CM | POA: Diagnosis not present

## 2023-11-27 DIAGNOSIS — D649 Anemia, unspecified: Secondary | ICD-10-CM | POA: Diagnosis not present

## 2023-11-27 DIAGNOSIS — Z8349 Family history of other endocrine, nutritional and metabolic diseases: Secondary | ICD-10-CM | POA: Diagnosis not present

## 2023-11-27 DIAGNOSIS — Z7901 Long term (current) use of anticoagulants: Secondary | ICD-10-CM | POA: Diagnosis not present

## 2023-11-27 DIAGNOSIS — Z5986 Financial insecurity: Secondary | ICD-10-CM | POA: Diagnosis not present

## 2023-11-27 DIAGNOSIS — I4891 Unspecified atrial fibrillation: Secondary | ICD-10-CM | POA: Insufficient documentation

## 2023-11-27 DIAGNOSIS — Z950 Presence of cardiac pacemaker: Secondary | ICD-10-CM | POA: Diagnosis not present

## 2023-11-27 DIAGNOSIS — K2970 Gastritis, unspecified, without bleeding: Secondary | ICD-10-CM | POA: Diagnosis not present

## 2023-11-27 DIAGNOSIS — I110 Hypertensive heart disease with heart failure: Secondary | ICD-10-CM | POA: Insufficient documentation

## 2023-11-27 DIAGNOSIS — F319 Bipolar disorder, unspecified: Secondary | ICD-10-CM | POA: Diagnosis not present

## 2023-11-27 DIAGNOSIS — Z823 Family history of stroke: Secondary | ICD-10-CM | POA: Insufficient documentation

## 2023-11-27 DIAGNOSIS — E039 Hypothyroidism, unspecified: Secondary | ICD-10-CM | POA: Diagnosis not present

## 2023-11-27 DIAGNOSIS — R1013 Epigastric pain: Secondary | ICD-10-CM

## 2023-11-27 DIAGNOSIS — K219 Gastro-esophageal reflux disease without esophagitis: Secondary | ICD-10-CM | POA: Diagnosis not present

## 2023-11-27 DIAGNOSIS — I509 Heart failure, unspecified: Secondary | ICD-10-CM | POA: Diagnosis not present

## 2023-11-27 DIAGNOSIS — Z8249 Family history of ischemic heart disease and other diseases of the circulatory system: Secondary | ICD-10-CM | POA: Insufficient documentation

## 2023-11-27 DIAGNOSIS — Z8673 Personal history of transient ischemic attack (TIA), and cerebral infarction without residual deficits: Secondary | ICD-10-CM | POA: Diagnosis not present

## 2023-11-27 DIAGNOSIS — G4733 Obstructive sleep apnea (adult) (pediatric): Secondary | ICD-10-CM | POA: Insufficient documentation

## 2023-11-27 DIAGNOSIS — E1140 Type 2 diabetes mellitus with diabetic neuropathy, unspecified: Secondary | ICD-10-CM | POA: Diagnosis not present

## 2023-11-27 DIAGNOSIS — F419 Anxiety disorder, unspecified: Secondary | ICD-10-CM | POA: Insufficient documentation

## 2023-11-27 DIAGNOSIS — Z7984 Long term (current) use of oral hypoglycemic drugs: Secondary | ICD-10-CM | POA: Insufficient documentation

## 2023-11-27 DIAGNOSIS — J45909 Unspecified asthma, uncomplicated: Secondary | ICD-10-CM | POA: Diagnosis not present

## 2023-11-27 DIAGNOSIS — J4489 Other specified chronic obstructive pulmonary disease: Secondary | ICD-10-CM | POA: Diagnosis not present

## 2023-11-27 DIAGNOSIS — Z7985 Long-term (current) use of injectable non-insulin antidiabetic drugs: Secondary | ICD-10-CM | POA: Diagnosis not present

## 2023-11-27 DIAGNOSIS — R1310 Dysphagia, unspecified: Secondary | ICD-10-CM | POA: Diagnosis not present

## 2023-11-27 DIAGNOSIS — J449 Chronic obstructive pulmonary disease, unspecified: Secondary | ICD-10-CM | POA: Diagnosis not present

## 2023-11-27 HISTORY — PX: BIOPSY: SHX5522

## 2023-11-27 HISTORY — PX: BALLOON DILATION: SHX5330

## 2023-11-27 HISTORY — PX: ESOPHAGOGASTRODUODENOSCOPY (EGD) WITH PROPOFOL: SHX5813

## 2023-11-27 LAB — GLUCOSE, CAPILLARY: Glucose-Capillary: 152 mg/dL — ABNORMAL HIGH (ref 70–99)

## 2023-11-27 SURGERY — ESOPHAGOGASTRODUODENOSCOPY (EGD) WITH PROPOFOL
Anesthesia: General

## 2023-11-27 MED ORDER — LACTATED RINGERS IV SOLN
INTRAVENOUS | Status: DC | PRN
Start: 2023-11-27 — End: 2023-11-27

## 2023-11-27 MED ORDER — EPHEDRINE SULFATE (PRESSORS) 50 MG/ML IJ SOLN
INTRAMUSCULAR | Status: DC | PRN
  Administered 2023-11-27: 5 mg via INTRAVENOUS
  Administered 2023-11-27 (×2): 10 mg via INTRAVENOUS

## 2023-11-27 MED ORDER — PROPOFOL 500 MG/50ML IV EMUL
INTRAVENOUS | Status: DC | PRN
  Administered 2023-11-27: 125 ug/kg/min via INTRAVENOUS

## 2023-11-27 MED ORDER — LACTATED RINGERS IV SOLN: INTRAVENOUS | Status: DC

## 2023-11-27 MED ORDER — LIDOCAINE HCL (CARDIAC) PF 100 MG/5ML IV SOSY
PREFILLED_SYRINGE | INTRAVENOUS | Status: DC | PRN
  Administered 2023-11-27: 60 mg via INTRAVENOUS

## 2023-11-27 MED ORDER — LIDOCAINE HCL (CARDIAC) PF 100 MG/5ML IV SOSY: PREFILLED_SYRINGE | INTRAVENOUS | Status: DC | PRN

## 2023-11-27 MED ORDER — PROPOFOL 10 MG/ML IV BOLUS
INTRAVENOUS | Status: DC | PRN
  Administered 2023-11-27: 50 mg via INTRAVENOUS

## 2023-11-27 NOTE — Discharge Instructions (Signed)
EGD Discharge instructions Please read the instructions outlined below and refer to this sheet in the next few weeks. These discharge instructions provide you with general information on caring for yourself after you leave the hospital. Your doctor may also give you specific instructions. While your treatment has been planned according to the most current medical practices available, unavoidable complications occasionally occur. If you have any problems or questions after discharge, please call your doctor. ACTIVITY You may resume your regular activity but move at a slower pace for the next 24 hours.  Take frequent rest periods for the next 24 hours.  Walking will help expel (get rid of) the air and reduce the bloated feeling in your abdomen.  No driving for 24 hours (because of the anesthesia (medicine) used during the test).  You may shower.  Do not sign any important legal documents or operate any machinery for 24 hours (because of the anesthesia used during the test).  NUTRITION Drink plenty of fluids.  You may resume your normal diet.  Begin with a light meal and progress to your normal diet.  Avoid alcoholic beverages for 24 hours or as instructed by your caregiver.  MEDICATIONS You may resume your normal medications unless your caregiver tells you otherwise.  WHAT YOU CAN EXPECT TODAY You may experience abdominal discomfort such as a feeling of fullness or "gas" pains.  FOLLOW-UP Your doctor will discuss the results of your test with you.  SEEK IMMEDIATE MEDICAL ATTENTION IF ANY OF THE FOLLOWING OCCUR: Excessive nausea (feeling sick to your stomach) and/or vomiting.  Severe abdominal pain and distention (swelling).  Trouble swallowing.  Temperature over 101 F (37.8 C).  Rectal bleeding or vomiting of blood.   Your EGD revealed mild amount inflammation in your stomach.  I took biopsies of this to rule out infection with a bacteria called H. pylori.  Await pathology results, my  office will contact you.  I did stretch your esophagus today.  Hopefully this helps with the feeling of food/pills getting stuck.  Small bowel appeared normal.  Continue on pantoprazole twice daily.  Follow-up in 6 weeks.   I hope you have a great rest of your week!  Hennie Duos. Marletta Lor, D.O. Gastroenterology and Hepatology [facility]

## 2023-11-27 NOTE — Anesthesia Preprocedure Evaluation (Signed)
Anesthesia Evaluation  Patient identified by MRN, date of birth, ID band Patient awake    Reviewed: Allergy & Precautions, H&P , NPO status , Patient's Chart, lab work & pertinent test results, reviewed documented beta blocker date and time   History of Anesthesia Complications (+) history of anesthetic complications  Airway Mallampati: II  TM Distance: >3 FB Neck ROM: full    Dental no notable dental hx.    Pulmonary neg pulmonary ROS, shortness of breath, asthma , sleep apnea , COPD, Patient abstained from smoking., former smoker   Pulmonary exam normal breath sounds clear to auscultation       Cardiovascular Exercise Tolerance: Good hypertension, +CHF  negative cardio ROS + dysrhythmias Atrial Fibrillation + pacemaker  Rhythm:regular Rate:Normal     Neuro/Psych  Headaches PSYCHIATRIC DISORDERS Anxiety  Bipolar Disorder   TIA Neuromuscular disease negative neurological ROS  negative psych ROS   GI/Hepatic negative GI ROS, Neg liver ROS,GERD  ,,  Endo/Other  negative endocrine ROSdiabetesHypothyroidism    Renal/GU negative Renal ROS  negative genitourinary   Musculoskeletal   Abdominal   Peds  Hematology negative hematology ROS (+) Blood dyscrasia, anemia   Anesthesia Other Findings   Reproductive/Obstetrics negative OB ROS                             Anesthesia Physical Anesthesia Plan  ASA: 3  Anesthesia Plan: General   Post-op Pain Management:    Induction:   PONV Risk Score and Plan: Propofol infusion  Airway Management Planned:   Additional Equipment:   Intra-op Plan:   Post-operative Plan:   Informed Consent: I have reviewed the patients History and Physical, chart, labs and discussed the procedure including the risks, benefits and alternatives for the proposed anesthesia with the patient or authorized representative who has indicated his/her understanding and  acceptance.     Dental Advisory Given  Plan Discussed with: CRNA  Anesthesia Plan Comments:        Anesthesia Quick Evaluation

## 2023-11-27 NOTE — H&P (Signed)
Primary Care Physician:  Benita Stabile, MD Primary Gastroenterologist:  Dr. Marletta Lor  Pre-Procedure History & Physical: HPI:  Laura [name] is a 63 y.o. female is here  for an EGD with possible dilation due to history of dysphagia, GERD, epigastric pain  Past Medical History:  Diagnosis Date   Anemia    Aortic atherosclerosis (HCC)    Asthma    Atrial fibrillation and flutter (HCC)    a.) CHA2DS2VASc = 6 (sex, HTN, TIA x2, vascular disease history, T2DM);  b.) DCCV ~ 2009; c.) DCCV 08/29/2018 (200J x1 --> SR with 1st degree AVB); d.) rate/rhythm maintained on oral diltiazem + bisoprolol; chronically anticoagulated with apixaban   Bipolar disorder (HCC)    Carpal tunnel syndrome of right wrist    CHB (complete heart block) (HCC)    a.) s/p St. Jude dual chamber PPM placement   CHF (congestive heart failure) (HCC)    Complication of anesthesia    a.) "allergic reaction" to perioperative analgesics + anesthetic medications in [place]; unsure of combination; not able to recount reactions/events --> states "I ended up in the ICU for 5 days"   Constipation    a.) on linaclotide   COPD (chronic obstructive pulmonary disease) (HCC)    DDD (degenerative disc disease), thoracic    GERD (gastroesophageal reflux disease)    H/O congenital atrial septal defect (ASD) repair    Hallux valgus of right foot    Hammertoe of second toe of right foot    History of 2019 novel coronavirus disease (COVID-19)    a.) 10/2020; b.) 05/2021   HLD (hyperlipidemia)    HTN (hypertension)    Hypogammaglobulinemia (HCC)    a.) on Curitru   Hypothyroid    IgE deficiency (HCC)    Long term current use of anticoagulant    a.) apixaban   Lumbar stenosis    Migraines    Mitral stenosis 02/08/2022   a.) TTE 02/08/2022: EF 60-65%, mild MAC, mild MV stenosis (peak grad 14.6 / mean grad 4.0)   Neuropathy    NSVT (nonsustained ventricular tachycardia) (HCC) 10/31/2018   a.) Holter 10/31/2018: 6 beat run   Obesity     OSA on CPAP    Osteoarthritis    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 Jude dual chamber device; placed for symptomatic bradycardia secondary to intermittent CHB   PTSD (post-traumatic stress disorder)    Pulmonary nodules    Recurrent sinusitis    Short-term memory loss    Sleep difficulties    a.) takes melatonin   Symptomatic bradycardia    a.) s/p St. Jude dual chamber PPM placement   Syncope    TIA (transient ischemic attack)    x3-last one in 2015   Tremors of nervous system    Type 2 diabetes mellitus (HCC)     Past Surgical History:  Procedure Laterality Date   ASD REPAIR  1968   BIOPSY  01/26/2021   Procedure: BIOPSY;  Surgeon: Lanelle Bal, DO;  [facility]: [facility];  Service: Endoscopy;;  gastric   CARDIOVERSION  2009   CARDIOVERSION N/A 08/29/2018   Procedure: CARDIOVERSION;  Surgeon: Antoine Poche, MD;  Location: [facility];  Service: Endoscopy;  Laterality: N/A;   CARPAL TUNNEL RELEASE Bilateral    CESAREAN SECTION  1986, 1988, 1994   COLONOSCOPY WITH PROPOFOL N/A 01/26/2021   Procedure: COLONOSCOPY WITH PROPOFOL;  Surgeon: Lanelle Bal, DO;  [facility]: [facility];  Service: Endoscopy;  Laterality: N/A;  am appt, diabetic   ESOPHAGOGASTRODUODENOSCOPY (EGD) WITH PROPOFOL N/A 01/26/2021   Procedure: ESOPHAGOGASTRODUODENOSCOPY (EGD) WITH PROPOFOL;  Surgeon: Lanelle Bal, DO;  [facility]: [facility];  Service: Endoscopy;  Laterality: N/A;   HALLUX VALGUS [place]USTIN Right 07/29/2022   Procedure: HALLUX VALGUS;  Surgeon: Edwin Cap, DPM;  Location: [facility];  Service: Podiatry;  Laterality: Right;   HAMMER TOE SURGERY Right 07/29/2022   Procedure: HAMMER TOE CORRECTION 2ND;  Surgeon: Edwin Cap, DPM;  Location: [facility];  Service: Podiatry;  Laterality: Right;   LEFT HEART CATH AND CORONARY ANGIOGRAPHY N/A 08/30/2019   Procedure: LEFT HEART CATH AND CORONARY ANGIOGRAPHY;  Surgeon: Corky Crafts, MD;   Location: [facility] INVASIVE CV LAB;  Service: Cardiovascular;  Laterality: N/A;   NASAL SINUS SURGERY  2017   PACEMAKER IMPLANT N/A 03/03/2022   Procedure: PACEMAKER IMPLANT;  Surgeon: Marinus Maw, MD;  Location: [facility] INVASIVE CV LAB;  Service: Cardiovascular;  Laterality: N/A;   POLYPECTOMY  01/26/2021   Procedure: POLYPECTOMY;  Surgeon: Lanelle Bal, DO;  Location: [facility];  Service: Endoscopy;;  colon   TOOTH EXTRACTION N/A 07/04/2023   Procedure: DENTAL RESTORATION/EXTRACTIONS;  Surgeon: Ocie Doyne, DMD;  Location: [facility] OR;  Service: Oral Surgery;  Laterality: N/A;    Prior to Admission medications   Medication Sig Start Date End Date Taking? Authorizing Provider  atorvastatin (LIPITOR) 20 MG tablet Take 20 mg by mouth at bedtime. 12/26/22  Yes [provider]  Azelastine HCl 137 MCG/SPRAY SOLN Place 1 spray into both nostrils daily. 10/25/23  Yes Alfonse Spruce, MD  bisoprolol (ZEBETA) 10 MG tablet Take 1 tablet (10 mg total) by mouth daily. 08/16/23  Yes BranchDorothe Pea, MD  Cholecalciferol (VITAMIN D3) 125 MCG (5000 UT) CAPS Take 1 capsule (5,000 Units total) by mouth daily. 11/25/20  Yes Nida, Denman George, MD  Continuous Glucose Sensor (FREESTYLE LIBRE 3 SENSOR) MISC 1 Piece by Does not apply route every 14 (fourteen) days. Place 1 sensor on the skin every 14 days. Use to check glucose continuously 04/07/23  Yes Nida, Denman George, MD  diltiazem (CARDIZEM CD) 300 MG 24 hr capsule Take 1 capsule (300 mg total) by mouth daily. 08/16/23  Yes BranchDorothe Pea, MD  divalproex (DEPAKOTE ER) 500 MG 24 hr tablet Take 1 tablet (500 mg total) by mouth daily AND 2 tablets (1,000 mg total) at bedtime. 11/12/23 02/10/24 Yes [name], MD  FERROUS SULFATE PO Take 65 mg by mouth every evening.   Yes [provider]  fluticasone furoate-vilanterol (BREO ELLIPTA) 100-25 MCG/ACT AEPB Inhale 1 puff into the lungs daily. 10/25/23  Yes Alfonse Spruce, MD   gabapentin (NEURONTIN) 600 MG tablet Take 600 mg by mouth 3 (three) times daily. 07/20/21  Yes [provider]  hydrOXYzine (ATARAX) 25 MG tablet TAKE ONE TABLET BY MOUTH THREE TIMES DAILY AS NEEDED FOR ANXIETY OR FOR ITCHING 03/10/23  Yes Alfonse Spruce, MD  Immune Globulin, Human, (CUVITRU [place]) Inject 27 mg into the skin every 14 (fourteen) days. 12/24/21  Yes [provider]  insulin glargine (LANTUS SOLOSTAR) 100 UNIT/ML Solostar Pen Inject 30 Units into the skin at bedtime. 11/16/23  Yes Nida, Denman George, MD  levothyroxine (SYNTHROID, LEVOTHROID) 112 MCG tablet Take 112 mcg by mouth daily before breakfast.   Yes [provider]  linaclotide (LINZESS) 290 MCG CAPS capsule Take 1 capsule (290 mcg total) by mouth daily before breakfast. 10/24/23  Yes Tiffany Kocher, PA-C  losartan (COZAAR) 50 MG tablet Take 50 mg by mouth every morning.   Yes [provider]  metFORMIN (GLUCOPHAGE) 500 MG tablet Take 500 mg by mouth 2 (two) times daily. 07/21/21  Yes [provider]  montelukast (SINGULAIR) 10 MG tablet Take 1 tablet (10 mg total) by mouth at bedtime. 10/25/23  Yes Alfonse Spruce, MD  ondansetron (ZOFRAN-ODT) 8 MG disintegrating tablet Take 8 mg by mouth daily as needed for vomiting or nausea.   Yes [provider]  Potassium Chloride ER 20 MEQ TBCR Take 20 mEq by mouth daily. 01/04/21  Yes [provider]  sertraline (ZOLOFT) 25 MG tablet Take 1 tablet (25 mg total) by mouth at bedtime. 11/16/23 02/14/24 Yes [name], MD  topiramate (TOPAMAX) 100 MG tablet Take one tablet and a half (150 mg) in the morning and one tablet (100 mg) in the evening. 11/10/23  Yes Wertman, Sung Amabile, PA-C  BD PEN NEEDLE NANO 2ND GEN 32G X 4 MM MISC 1 each by Other route in the morning, at noon, in the evening, and at bedtime. 05/16/22   Roma Kayser, MD  blood glucose meter kit and supplies 1 each by Other route 4 (four) times daily.  Dispense based on patient and insurance preference. Use up to four times daily as directed. (FOR ICD-10 E10.9, E11.9). 01/27/21   Roma Kayser, MD  Dulaglutide (TRULICITY) 4.5 MG/0.5ML SOAJ Inject 4.5 mg into the skin once a week. 08/15/23   Roma Kayser, MD  ELDERBERRY PO Take 4 g by mouth daily. Gummies 2g each    [provider]  ELIQUIS 5 MG TABS tablet TAKE ONE TABLET BY MOUTH TWICE DAILY @ 9AM & 5PM 09/18/23   Antoine Poche, MD  empagliflozin (JARDIANCE) 10 MG TABS tablet Take 1 tablet (10 mg total) by mouth daily before breakfast. 08/15/23   Nida, Denman George, MD  EPINEPHrine 0.3 mg/0.3 mL IJ SOAJ injection Inject 0.3 mg into the muscle once as needed for anaphylaxis. 01/17/20   [provider]  FASENRA PEN 30 MG/ML SOAJ INJECT 30MG  SUBCUTANEOUSLY EVERY 8 WEEKS 02/20/23   Alfonse Spruce, MD  Insulin Pen Needle (PEN NEEDLES 3/16") 31G X 5 MM MISC Use as directed with insulin pen 10/21/21   Roma Kayser, MD  levalbuterol [facility] HFA) 45 MCG/ACT inhaler USE 2 INHALATIONS BY MOUTH EVERY 6 HOURS AS NEEDED FOR WHEEZING 11/14/23   Alfonse Spruce, MD  levalbuterol [facility]) 1.25 MG/3ML nebulizer solution Use 1 vial in the nebulizer every 6 hours as needed for cough, wheeze, shortness of breath or chest tightness. 11/03/23   Alfonse Spruce, MD  MELATONIN GUMMIES PO Take 10 mg by mouth at bedtime.    [provider]  Multiple Vitamins-Minerals (HAIR SKIN & NAILS PO) Take 1 tablet by mouth daily.    [provider]  Multiple Vitamins-Minerals (MULTIVITAMIN WITH MINERALS) tablet Take 1 tablet by mouth daily. Woman    [provider]  Nebulizer MISC 1 Device by Other route as directed. Nebulizer tubing kit 10/25/23   Alfonse Spruce, MD  [facility] ULTRA test strip 1 each by Other route in the morning, at noon, in the evening, and at bedtime. 10/17/19   [provider]  pantoprazole (PROTONIX) 40 MG tablet  Take 1 tablet (40 mg total) by mouth 2 (two) times daily before a meal. 10/24/23   Tiffany Kocher, PA-C  polyethylene glycol [facility] /  GLYCOLAX) 17 g packet Take 17 g by mouth daily as needed. Patient taking differently: Take 17 g by mouth daily as needed for moderate constipation or severe constipation. 04/17/21   [place], Eric J, DO  Spacer/Aero-Holding Chambers DEVI 1 Device by Does not apply route as directed. 10/25/23   Alfonse Spruce, MD  torsemide (DEMADEX) 20 MG tablet TAKE 1 TABLET BY MOUTH DAILY FOR FLUID GAIN OF 3 LBS OVERNIGHT OR 5 LBS IN A WEEK 11/21/23   Antoine Poche, MD    Allergies as of 10/31/2023 - Review Complete 10/27/2023  Allergen Reaction Noted   Ativan [lorazepam] Hives 02/06/2018   Phenergan [promethazine hcl] Hives 02/06/2018    Family History  Problem Relation Age of Onset   Hypertension Mother    Stroke Mother    Kidney disease Mother    Heart attack Mother    Alcohol abuse Mother    Other Father        ?colon cancer   Kidney disease Sister    Hypertension Sister    Bipolar disorder Sister    Atrial fibrillation Sister    Heart disease Brother    Prostate cancer Brother    Thyroid disease Daughter    Hashimoto's thyroiditis Daughter    Celiac disease Daughter    Allergic rhinitis Daughter    Stroke Maternal Aunt    Heart disease Maternal Aunt    Asthma Neg Hx     Social History   Socioeconomic History   Marital status: Widowed    Spouse name: Not on file   Number of children: 3   Years of education: 10   Highest education level: Not on file  Occupational History   Not on file  Tobacco Use   Smoking status: Former    Current packs/day: 0.00    Average packs/day: 1 pack/day for 28.0 years (28.0 ttl pk-yrs)    Types: Cigarettes    Start date: 21    Quit date: 2008    Years since quitting: 17.1   Smokeless tobacco: Never  Vaping Use   Vaping status: Never Used  Substance and Sexual Activity   Alcohol use: Not Currently    Drug use: Not Currently   Sexual activity: Not Currently    Birth control/protection: Post-menopausal  Other Topics Concern   Not on file  Social History Narrative   Right handed   Drinks caffeine   One story home   Social Drivers of Health   Financial Resource Strain: High Risk (01/06/2022)   Overall Financial Resource Strain (CARDIA)    Difficulty of Paying Living Expenses: Hard  Food Insecurity: No Food Insecurity (01/06/2022)   Hunger Vital Sign    Worried About Running Out of Food in the Last Year: Never true    Ran Out of Food in the Last Year: Never true  Transportation Needs: No Transportation Needs (01/06/2022)   PRAPARE - Administrator, Civil Service (Medical): No    Lack of Transportation (Non-Medical): No  Physical Activity: Insufficiently Active (01/06/2022)   Exercise Vital Sign    Days of Exercise per Week: 4 days    Minutes of Exercise per Session: 30 min  Stress: No Stress Concern Present (01/06/2022)   [employer] of Occupational Health - Occupational Stress Questionnaire    Feeling of Stress : Only a little  Social Connections: Moderately Integrated (01/06/2022)   Social Connection and Isolation Panel [NHANES]    Frequency of Communication with Friends and Family: More than  three times a week    Frequency of Social Gatherings with Friends and Family: More than three times a week    Attends Religious Services: More than 4 times per year    Active Member of Clubs or Organizations: Yes    Attends Banker Meetings: More than 4 times per year    Marital Status: Widowed  Intimate Partner Violence: Not At Risk (01/06/2022)   Humiliation, Afraid, Rape, and Kick questionnaire    Fear of Current or Ex-Partner: No    Emotionally Abused: No    Physically Abused: No    Sexually Abused: No    Review of Systems: General: Negative for fever, chills, fatigue, weakness. Eyes: Negative for vision changes.  ENT: Negative for hoarseness,  difficulty swallowing , nasal congestion. CV: Negative for chest pain, angina, palpitations, dyspnea on exertion, peripheral edema.  Respiratory: Negative for dyspnea at rest, dyspnea on exertion, cough, sputum, wheezing.  GI: See history of present illness. GU:  Negative for dysuria, hematuria, urinary incontinence, urinary frequency, nocturnal urination.  MS: Negative for joint pain, low back pain.  Derm: Negative for rash or itching.  Neuro: Negative for weakness, abnormal sensation, seizure, frequent headaches, memory loss, confusion.  Psych: Negative for anxiety, depression Endo: Negative for unusual weight change.  Heme: Negative for bruising or bleeding. Allergy: Negative for rash or hives.  Physical Exam: Vital signs in last 24 hours: Temp:  [99.3 F (37.4 C)] 99.3 F (37.4 C) (02/17 0850) Pulse Rate:  [66] 66 (02/17 0850) Resp:  [18] 18 (02/17 0850) BP: (133)/(55) 133/55 (02/17 0850) SpO2:  [99 %] 99 % (02/17 0850)   General:   Alert,  Well-developed, well-nourished, pleasant and cooperative in NAD Head:  Normocephalic and atraumatic. Eyes:  Sclera clear, no icterus.   Conjunctiva pink. Ears:  Normal auditory acuity. Nose:  No deformity, discharge,  or lesions. Msk:  Symmetrical without gross deformities. Normal posture. Extremities:  Without clubbing or edema. Neurologic:  Alert and  oriented x4;  grossly normal neurologically. Skin:  Intact without significant lesions or rashes. Psych:  Alert and cooperative. Normal mood and affect.   Impression/Plan: Laura [name] is here for an EGD with possible dilation due to history of dysphagia, GERD, epigastric pain  Risks, benefits, limitations, imponderables and alternatives regarding procedure have been reviewed with the patient. Questions have been answered. All parties agreeable.

## 2023-11-27 NOTE — Op Note (Signed)
[facility] Patient Name: Laura [name] Procedure Date: 11/27/2023 9:41 AM MRN: [id number] Date of Birth: 1961/08/17 Attending MD: Hennie Duos. Marletta Lor , [place], [phone] CSN: [id number] Age: 63 Admit Type: Outpatient Procedure:                Upper GI endoscopy Indications:              Epigastric abdominal pain, Dysphagia, Heartburn Providers:                Hennie Duos. Marletta Lor, DO, Angelica Ran, Dyann Ruddle Referring MD:              Medicines:                See the Anesthesia note for documentation of the                            administered medications Complications:            No immediate complications. Estimated Blood Loss:     Estimated blood loss was minimal. Procedure:                Pre-Anesthesia Assessment:                           - The anesthesia plan was to use monitored                            anesthesia care (MAC).                           After obtaining informed consent, the endoscope was                            passed under direct vision. Throughout the                            procedure, the patient's blood pressure, pulse, and                            oxygen saturations were monitored continuously. The                            GIF-H190 ([id number]) scope was introduced through the                            mouth, and advanced to the second part of duodenum.                            The upper GI endoscopy was accomplished without                            difficulty. The patient tolerated the procedure                            well. Scope In: 9:58:52 AM Scope Out: 10:03:31 AM Total Procedure Duration: 0 hours 4 minutes 39 seconds  Findings:      The Z-line was regular.  No endoscopic abnormality was evident in the esophagus to explain the       patient's complaint of dysphagia. Preparations were made for empiric       dilation. A TTS dilator was passed through the scope. Dilation with an       18-19-20 mm balloon dilator was performed  to 20 mm. Dilation was       performed with a mild resistance at 20 mm. Estimated blood loss was none.      Patchy moderate inflammation characterized by congestion (edema),       erosions and erythema was found in the gastric body and in the gastric       antrum. Biopsies were taken with a cold forceps for Helicobacter pylori       testing.      The duodenal bulb, first portion of the duodenum and second portion of       the duodenum were normal. Impression:               - Z-line regular.                           - Gastritis. Biopsied.                           - Normal duodenal bulb, first portion of the                            duodenum and second portion of the duodenum. Moderate Sedation:      Per Anesthesia Care Recommendation:           - Patient has a contact number available for                            emergencies. The signs and symptoms of potential                            delayed complications were discussed with the                            patient. Return to normal activities tomorrow.                            Written discharge instructions were provided to the                            patient.                           - Resume previous diet.                           - Continue present medications.                           - Await pathology results.                           - Repeat upper endoscopy PRN for retreatment.                           -  Return to GI clinic in 6 weeks.                           - Use a proton pump inhibitor PO BID. Procedure Code(s):        --- Professional ---                           [phone], Esophagogastroduodenoscopy, flexible,                            transoral; with biopsy, single or multiple Diagnosis Code(s):        --- Professional ---                           K29.70, Gastritis, unspecified, without bleeding                           R10.13, Epigastric pain                           R13.10, Dysphagia, unspecified                            R12, Heartburn CPT copyright 2022 American Medical Association. All rights reserved. The codes documented in this report are preliminary and upon coder review may  be revised to meet current compliance requirements. Hennie Duos. Marletta Lor, DO Hennie Duos. Marletta Lor, DO 11/27/2023 10:08:24 AM This report has been signed electronically. Number of Addenda: 0

## 2023-11-27 NOTE — Transfer of Care (Signed)
Immediate Anesthesia Transfer of Care Note  Patient: Laura [name]  Procedure(s) Performed: ESOPHAGOGASTRODUODENOSCOPY (EGD) WITH PROPOFOL BALLOON DILATION BIOPSY  Patient Location: PACU  Anesthesia Type:MAC  Level of Consciousness: awake and alert   Airway & Oxygen Therapy: Patient Spontanous Breathing and Patient connected to nasal cannula oxygen  Post-op Assessment: Report given to RN and Post -op Vital signs reviewed and stable  Post vital signs: Reviewed and stable  Last Vitals:  Vitals Value Taken Time  BP 141/75 11/27/23 1014  Temp 36.6 C 11/27/23 1014  Pulse 61 11/27/23 1014  Resp 21 11/27/23 1014  SpO2 98 % 11/27/23 1014    Last Pain:  Vitals:   11/27/23 1014  TempSrc: Oral  PainSc: 0-No pain         Complications: No notable events documented.

## 2023-11-28 ENCOUNTER — Encounter (HOSPITAL_COMMUNITY): Payer: Self-pay | Admitting: Internal Medicine

## 2023-11-29 LAB — SURGICAL PATHOLOGY

## 2023-12-01 ENCOUNTER — Ambulatory Visit (INDEPENDENT_AMBULATORY_CARE_PROVIDER_SITE_OTHER): Payer: 59

## 2023-12-01 ENCOUNTER — Encounter: Payer: Self-pay | Admitting: Internal Medicine

## 2023-12-01 DIAGNOSIS — E118 Type 2 diabetes mellitus with unspecified complications: Secondary | ICD-10-CM | POA: Diagnosis not present

## 2023-12-01 DIAGNOSIS — E782 Mixed hyperlipidemia: Secondary | ICD-10-CM | POA: Diagnosis not present

## 2023-12-01 DIAGNOSIS — E039 Hypothyroidism, unspecified: Secondary | ICD-10-CM | POA: Diagnosis not present

## 2023-12-01 DIAGNOSIS — I495 Sick sinus syndrome: Secondary | ICD-10-CM

## 2023-12-01 DIAGNOSIS — E559 Vitamin D deficiency, unspecified: Secondary | ICD-10-CM | POA: Diagnosis not present

## 2023-12-01 LAB — CUP PACEART REMOTE DEVICE CHECK
Battery Remaining Longevity: 99 mo
Battery Remaining Percentage: 85 %
Battery Voltage: 3.01 V
Brady Statistic AP VP Percent: 44 %
Brady Statistic AP VS Percent: 1 %
Brady Statistic AS VP Percent: 5.8 %
Brady Statistic AS VS Percent: 49 %
Brady Statistic RA Percent Paced: 44 %
Brady Statistic RV Percent Paced: 50 %
Date Time Interrogation Session: 20250221051658
Implantable Lead Connection Status: 753985
Implantable Lead Connection Status: 753985
Implantable Lead Implant Date: 20230525
Implantable Lead Implant Date: 20230525
Implantable Lead Location: 753859
Implantable Lead Location: 753860
Implantable Pulse Generator Implant Date: 20230525
Lead Channel Impedance Value: 440 Ohm
Lead Channel Impedance Value: 450 Ohm
Lead Channel Pacing Threshold Amplitude: 0.625 V
Lead Channel Pacing Threshold Amplitude: 1 V
Lead Channel Pacing Threshold Pulse Width: 0.5 ms
Lead Channel Pacing Threshold Pulse Width: 0.5 ms
Lead Channel Sensing Intrinsic Amplitude: 1.7 mV
Lead Channel Sensing Intrinsic Amplitude: 11.8 mV
Lead Channel Setting Pacing Amplitude: 0.875
Lead Channel Setting Pacing Amplitude: 2.5 V
Lead Channel Setting Pacing Pulse Width: 0.5 ms
Lead Channel Setting Sensing Sensitivity: 0.5 mV
Pulse Gen Model: 2272
Pulse Gen Serial Number: 8084453

## 2023-12-02 LAB — LAB REPORT - SCANNED
Albumin, Urine POC: 7.4
Creatinine, POC: 17.5 mg/dL
EGFR: 88
Free T4: 1.39 ng/dL
Microalb Creat Ratio: 42
TSH: 1.34

## 2023-12-02 NOTE — Anesthesia Postprocedure Evaluation (Signed)
 Anesthesia Post Note  Patient: Laura [name]  Procedure(s) Performed: ESOPHAGOGASTRODUODENOSCOPY (EGD) WITH PROPOFOL BALLOON DILATION BIOPSY  Patient location during evaluation: Phase II Anesthesia Type: General Level of consciousness: awake Pain management: pain level controlled Vital Signs Assessment: post-procedure vital signs reviewed and stable Respiratory status: spontaneous breathing and respiratory function stable Cardiovascular status: blood pressure returned to baseline and stable Postop Assessment: no headache and no apparent nausea or vomiting Anesthetic complications: no Comments: Late entry   No notable events documented.   Last Vitals:  Vitals:   11/27/23 0850 11/27/23 1014  BP: (!) 133/55 (!) 141/75  Pulse: 66 61  Resp: 18 (!) 21  Temp: 37.4 C 36.6 C  SpO2: 99% 98%    Last Pain:  Vitals:   11/27/23 1014  TempSrc: Oral  PainSc: 0-No pain                 Windell Norfolk

## 2023-12-04 ENCOUNTER — Ambulatory Visit (INDEPENDENT_AMBULATORY_CARE_PROVIDER_SITE_OTHER): Payer: 59 | Admitting: Clinical

## 2023-12-04 ENCOUNTER — Other Ambulatory Visit: Payer: Self-pay | Admitting: Allergy & Immunology

## 2023-12-04 DIAGNOSIS — F4310 Post-traumatic stress disorder, unspecified: Secondary | ICD-10-CM

## 2023-12-04 NOTE — Progress Notes (Signed)
 Virtual Visit via Video Note   I connected with Laura [name] on 12/04/2023 at  9:00 AM EST by a video enabled telemedicine application and verified that I am speaking with the correct person using two identifiers.   Location: Patient: Home Provider: Office   I discussed the limitations of evaluation and management by telemedicine and the availability of in person appointments. The patient expressed understanding and agreed to proceed.   THERAPIST PROGRESS NOTE   Session Time: 9:00 AM-:9:45 AM   Participation Level: Active   Behavioral Response: CasualAlertDepressed   Type of Therapy: Individual Therapy   Treatment Goals addressed: Coping/ non-medication treatment therapy for PTSD.   Interventions: CBT, DBT, Solution Focused, Strength-based and Supportive   Summary: Laura [name] is a 63 y.o. female who presents with PTSD. The OPT therapist worked with the patient for her ongoing OPT treatment session. The OPT therapist utilized Motivational Interviewing to assist in creating therapeutic repore.The patient in the session was engaged and work in collaboration giving feedback about her triggers and symptoms over the past few weeks.The patient was engaged throughout the session. The patient spoke about getting to a place where she is done with her girls and the conflict with them .The OPT therapist utilized Cognitive Behavioral Therapy through cognitive restructuring as well as worked with the patient on coping strategies to assist in management of mental health symptoms. The OPT therapist worked with the patient on consistency in getting back to her health baseline and working to be consistent with her management of her external stressors with consistency especially around her interactions with other family members. The OPT therapist overviewed with the patient her basic health areas including sleep, eating, exercise, and hygiene.The OPT therapist placed emphasis on the patient keeping all  upcoming health appointments and adhering to ongoing health treatment recommendations. The OPT therapist reviewed all appointments listed coming up in the patients MyChart including upcoming appointment with [name] on 12/29/2023.    Suicidal/Homicidal: Nowithout intent/plan   Therapist Response:The OPT therapist worked with the patient for the patients scheduled session. The patient was engaged in her session and gave feedback in relation to triggers, symptoms, and behavior responses over the past few weeks. The patient spoke about interactions with family over the course of the past few weeks and getting to a place where she decided to exit herself from the drama. The patient identified that her girls getting together recently in [place] meeting and not inviting her after recent conflict with [name] her middle daughter was the trigger to her being upset recently as well as seeing pictures from her daughters and grand children together that was posted on social media (Facebook).The OPT therapist worked with the patient utilizing an in session Cognitive Behavioral Therapy exercise. The OPT therapist worked with the patient on the impact of her interactions with her middle daughter [name] and recently all of her daughters getting together in [place] without her and [employer] on Facebook this triggering the patient to feel left out and to feel like her 2 other daughters were siding with her middle daughter [name] who she has recently been in conflict with. The OPT therapist worked with the patient on getting to and maintaining baseline thought consistency in covering her basic needs.The patient has been working with her physical health providers to manage physical health conditions. The patient spoke about her mood and concern and focus on her health moving forward with intent to follow up with her health providers. The patients spoke about going  out with her friends recently and getting out of the home.  The patient spoke about her upcoming birthday 12/07/2022 and she will be turning 63.The OPT therapist worked with the patient overviewing upcoming appointments as listed in the patients Mychart including upcoming med management with [name] on 12/29/2023.The OPT therapist will continue treatment work with the patient in her next scheduled session.   Plan: Return again in 2/3 weeks.   Diagnosis:      Axis I: Post Traumatic Stress Disorder                            Axis II: No diagnosis     Collaboration of Care: Overview with the patient of involvement in the Med Management program with [name].   Patient/Guardian was advised Release of Information must be obtained prior to any record release in order to collaborate their care with an outside provider. Patient/Guardian was advised if they have not already done so to contact the registration department to sign all necessary forms in order for [place] to release information regarding their care.    Consent: Patient/Guardian gives verbal consent for treatment and assignment of benefits for services provided during this visit. Patient/Guardian expressed understanding and agreed to proceed.      I discussed the assessment and treatment plan with the patient. The patient was provided an opportunity to ask questions and all were answered. The patient agreed with the plan and demonstrated an understanding of the instructions.   The patient was advised to call back or seek an in-person evaluation if the symptoms worsen or if the condition fails to improve as anticipated.   I provided 45 minutes of non-face-to-face time during this encounter.   Suzan Garibaldi, LCSW    12/04/2023

## 2023-12-05 DIAGNOSIS — E782 Mixed hyperlipidemia: Secondary | ICD-10-CM | POA: Diagnosis not present

## 2023-12-05 DIAGNOSIS — I110 Hypertensive heart disease with heart failure: Secondary | ICD-10-CM | POA: Diagnosis not present

## 2023-12-05 DIAGNOSIS — E039 Hypothyroidism, unspecified: Secondary | ICD-10-CM | POA: Diagnosis not present

## 2023-12-05 DIAGNOSIS — I10 Essential (primary) hypertension: Secondary | ICD-10-CM | POA: Diagnosis not present

## 2023-12-05 DIAGNOSIS — K219 Gastro-esophageal reflux disease without esophagitis: Secondary | ICD-10-CM | POA: Diagnosis not present

## 2023-12-05 DIAGNOSIS — K5900 Constipation, unspecified: Secondary | ICD-10-CM | POA: Diagnosis not present

## 2023-12-05 DIAGNOSIS — I5032 Chronic diastolic (congestive) heart failure: Secondary | ICD-10-CM | POA: Diagnosis not present

## 2023-12-05 DIAGNOSIS — D696 Thrombocytopenia, unspecified: Secondary | ICD-10-CM | POA: Diagnosis not present

## 2023-12-05 DIAGNOSIS — E559 Vitamin D deficiency, unspecified: Secondary | ICD-10-CM | POA: Diagnosis not present

## 2023-12-05 DIAGNOSIS — I480 Paroxysmal atrial fibrillation: Secondary | ICD-10-CM | POA: Diagnosis not present

## 2023-12-05 DIAGNOSIS — R0789 Other chest pain: Secondary | ICD-10-CM | POA: Diagnosis not present

## 2023-12-07 DIAGNOSIS — D839 Common variable immunodeficiency, unspecified: Secondary | ICD-10-CM | POA: Diagnosis not present

## 2023-12-21 ENCOUNTER — Encounter: Payer: 59 | Attending: Internal Medicine | Admitting: Nutrition

## 2023-12-21 ENCOUNTER — Encounter: Payer: Self-pay | Admitting: Nutrition

## 2023-12-21 VITALS — Ht 64.0 in | Wt 188.0 lb

## 2023-12-21 DIAGNOSIS — E782 Mixed hyperlipidemia: Secondary | ICD-10-CM | POA: Diagnosis not present

## 2023-12-21 DIAGNOSIS — F5002 Anorexia nervosa, binge eating/purging type, mild: Secondary | ICD-10-CM | POA: Diagnosis present

## 2023-12-21 DIAGNOSIS — I10 Essential (primary) hypertension: Secondary | ICD-10-CM | POA: Insufficient documentation

## 2023-12-21 DIAGNOSIS — D839 Common variable immunodeficiency, unspecified: Secondary | ICD-10-CM | POA: Diagnosis not present

## 2023-12-21 DIAGNOSIS — R630 Anorexia: Secondary | ICD-10-CM | POA: Diagnosis present

## 2023-12-21 DIAGNOSIS — E669 Obesity, unspecified: Secondary | ICD-10-CM | POA: Insufficient documentation

## 2023-12-21 DIAGNOSIS — E1165 Type 2 diabetes mellitus with hyperglycemia: Secondary | ICD-10-CM | POA: Insufficient documentation

## 2023-12-21 NOTE — Patient Instructions (Addendum)
 Goals Breakfast 1 egg, 1 slice avacado toast and a piece of fruit. Lunch: 12-2: Sweetpotato, celery, 1/2 c lentils D)Fish, baked potato, steam veggie, fruit, Drink water Dont' skip meals. I will refer you to our dietitian that specializes in eating disorder to help you. The Dietitian can refer to you a therapist that specializes in disordered eating as you work with her. Increase protein rich foods of dried beans, peas, lentils, whole gains.

## 2023-12-21 NOTE — Progress Notes (Signed)
 Medical Nutrition Therapy  Appointment Start time:  1045 Appointment End time:1115  Nutrition: Follow up "I don't have an appetite. I just eat dinner at night. I have happy with my weight loss. I don't want to eat for fear of gaining weight." Sees Dr. Fransico Him, Endocrinology. Currently taking Trulicity, Jardiance and Lantus  30 units at night and Metformin. PCP Dr. Margo Aye   Currently taking Trulicity for her her DM and obesity.  Diet is insuffient to meet her nutritional needs. She is at high risk for Protein Calorie Malnutrition.  Only eating 1 meal per day. Has no appetite due to the Trulicity but doesn't want to stop taking it. Admits she has an "eating disorder." Fear of gaining weight back. Would be willing to see a specialist in ED. Has PTSD. She notes she is a stress eater.  Has been avoiding carbohydrates. Reports her A1C 7.6%. CGM shows Blood sugars are elevated due to lack of eating balanced meals. TIR 52% and TAR 40% and 8% Level 2 hyperglycemia. Weight loss may be related to hyperglycemia as well.  She would benefit from working with a dietitian and therapist who specialize in eating disorders.      Latest Ref Rng & Units 11/15/2023   10:40 AM 10/06/2023    9:01 AM 10/03/2023    8:37 AM  CMP  Glucose 70 - 99 mg/dL 161  096  045   BUN 8 - 23 mg/dL 18  19  15    Creatinine 0.44 - 1.00 mg/dL 4.09  8.11  9.14   Sodium 135 - 145 mmol/L 140  138  139   Potassium 3.5 - 5.1 mmol/L 4.3  4.0  4.1   Chloride 98 - 111 mmol/L 110  108  110   CO2 22 - 32 mmol/L 20  20  22    Calcium 8.9 - 10.3 mg/dL 9.0  9.4  9.0   Total Protein 6.5 - 8.1 g/dL 7.3   7.9   Total Bilirubin 0.0 - 1.2 mg/dL 0.4   0.4   Alkaline Phos 38 - 126 U/L 55   64   AST 15 - 41 U/L 17   15   ALT 0 - 44 U/L 16   15    Lipid Panel     Component Value Date/Time   CHOL 147 11/15/2023 1040   CHOL 157 03/03/2023 1121   TRIG 147 11/15/2023 1040   HDL 42 11/15/2023 1040   HDL 44 03/03/2023 1121   CHOLHDL 3.5 11/15/2023  1040   VLDL 29 11/15/2023 1040   LDLCALC 76 11/15/2023 1040   LDLCALC 92 03/03/2023 1121   LABVLDL 21 03/03/2023 1121     She notes her blood sugars are better and sometimes dropping. Has Libre.  Average Glucose 150 mg/dL Glucose Management Indicator (GMI) 6.9% Glucose Variability 18.7%  Working on eating more whole plant based foods. Doing much better.   Going to start going to [facility] weekly.  Anthropometrics  Wt Readings from Last 3 Encounters:  11/23/23 188 lb (85.3 kg)  11/16/23 189 lb 3.2 oz (85.8 kg)  10/25/23 191 lb 6.4 oz (86.8 kg)   Ht Readings from Last 3 Encounters:  11/23/23 5\' 4"  (1.626 m)  11/16/23 5' 2.5" (1.588 m)  10/25/23 5' 2.52" (1.588 m)   There is no height or weight on file to calculate BMI. @BMIFA @ Facility age limit for growth %iles is 20 years. Facility age limit for growth %iles is 20 years. Clinical Medical Hx: CHF and DM,  Medications: Lantus 50 units a day now, Trulicity weekly and Metformin.  Labs:  Lab Results  Component Value Date   HGBA1C 7.0 07/12/2023      Latest Ref Rng & Units 11/15/2023   10:40 AM 10/06/2023    9:01 AM 10/03/2023    8:37 AM  CMP  Glucose 70 - 99 mg/dL 161  096  045   BUN 8 - 23 mg/dL 18  19  15    Creatinine 0.44 - 1.00 mg/dL 4.09  8.11  9.14   Sodium 135 - 145 mmol/L 140  138  139   Potassium 3.5 - 5.1 mmol/L 4.3  4.0  4.1   Chloride 98 - 111 mmol/L 110  108  110   CO2 22 - 32 mmol/L 20  20  22    Calcium 8.9 - 10.3 mg/dL 9.0  9.4  9.0   Total Protein 6.5 - 8.1 g/dL 7.3   7.9   Total Bilirubin 0.0 - 1.2 mg/dL 0.4   0.4   Alkaline Phos 38 - 126 U/L 55   64   AST 15 - 41 U/L 17   15   ALT 0 - 44 U/L 16   15    Notable Signs/Symptoms:    Lifestyle & Dietary  Lives with her sister. Her sister is trying to help her learn how to cook healthier foods and meals. Estimated daily fluid intake:  64 oz  Working on eating meals on time.   Sleep: varies  Stress / self-care: stress eater Current average  weekly physical activity: ADL     Estimated Energy Needs Calories: 1200 Carbohydrate: 135g Protein: 90g Fat: 33g   NUTRITION DIAGNOSIS  NB-1.1 Food and nutrition-related knowledge deficit As related to Diabetes Type 2.  As evidenced by A1C 7.7%.   NUTRITION INTERVENTION  Nutrition education (E-1) on the following topics:  Lifestyle Medicine - Whole Food, Plant Predominant Nutrition is highly recommended: Eat Plenty of vegetables, Mushrooms, fruits, Legumes, Whole Grains, Nuts, seeds in lieu of processed meats, processed snacks/pastries red meat, poultry, eggs.    -It is better to avoid simple carbohydrates including: Cakes, Sweet Desserts, Ice Cream, Soda (diet and regular), Sweet Tea, Candies, Chips, Cookies, Store Bought Juices, Alcohol in Excess of  1-2 drinks a day, Lemonade,  Artificial Sweeteners, Doughnuts, Coffee Creamers, "Sugar-free" Products, etc, etc.  This is not a complete list.....  Exercise: If you are able: 30 -60 minutes a day ,4 days a week, or 150 minutes a week.  The longer the better.  Combine stretch, strength, and aerobic activities.  If you were told in the past that you have high risk for cardiovascular diseases, you may seek evaluation by your heart doctor prior to initiating moderate to intense exercise programs.  Handouts Provided Include  .Nutrition Lifestyle handout  Learning Style & Readiness for Change Teaching method utilized: Visual & Auditory  Demonstrated degree of understanding via: Teach Back  Barriers to learning/adherence to lifestyle change: none  Goals Established by Pt  Goals Breakfast 1 egg, 1 slice avacado toast and a piece of fruit. Lunch: 12-2: Sweetpotato, celery, 1/2 c lentils D)Fish, baked potato, steam veggie, fruit, Drink water Dont' skip meals. I will refer you to our dietitian that specializes in eating disorder to help you. The Dietitian can refer to you a therapist that specializes in disordered eating as you work with  her. Increase protein rich foods of dried beans, peas, lentils, whole gains.   MONITORING & EVALUATION Dietary intake, weekly physical  activity, and blood sugars in 3 month.   Next Steps  Get back on track with meal planning and eating meals on time-more plant based foods.
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
## 2023-12-26 ENCOUNTER — Encounter: Payer: Self-pay | Admitting: Nutrition

## 2023-12-29 ENCOUNTER — Telehealth (INDEPENDENT_AMBULATORY_CARE_PROVIDER_SITE_OTHER): Payer: 59 | Admitting: Psychiatry

## 2023-12-29 ENCOUNTER — Encounter: Payer: Self-pay | Admitting: Psychiatry

## 2023-12-29 ENCOUNTER — Other Ambulatory Visit: Payer: Self-pay | Admitting: Allergy & Immunology

## 2023-12-29 DIAGNOSIS — Z5181 Encounter for therapeutic drug level monitoring: Secondary | ICD-10-CM

## 2023-12-29 DIAGNOSIS — F4310 Post-traumatic stress disorder, unspecified: Secondary | ICD-10-CM | POA: Diagnosis not present

## 2023-12-29 DIAGNOSIS — F330 Major depressive disorder, recurrent, mild: Secondary | ICD-10-CM

## 2023-12-29 DIAGNOSIS — Z79899 Other long term (current) drug therapy: Secondary | ICD-10-CM

## 2023-12-29 MED ORDER — DIVALPROEX SODIUM ER 500 MG PO TB24: ORAL_TABLET | ORAL | 0 refills | Status: DC

## 2023-12-29 MED ORDER — SERTRALINE HCL 50 MG PO TABS: 50.0000 mg | ORAL_TABLET | Freq: Every day | ORAL | 0 refills | Status: DC

## 2023-12-29 NOTE — Patient Instructions (Signed)
 Continue Depakote 500 mg in AM, 1000 mg at night  Increase sertraline 50 mg at night  Obtain  lab- CBC, valproic acid Next appointment: 5/7 at 9 30 am

## 2023-12-31 ENCOUNTER — Other Ambulatory Visit: Payer: Self-pay | Admitting: Cardiology

## 2024-01-01 ENCOUNTER — Ambulatory Visit (INDEPENDENT_AMBULATORY_CARE_PROVIDER_SITE_OTHER): Payer: 59 | Admitting: Clinical

## 2024-01-01 DIAGNOSIS — F4310 Post-traumatic stress disorder, unspecified: Secondary | ICD-10-CM
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
## 2024-01-01 NOTE — Telephone Encounter (Signed)
 Prescription refill request for Eliquis received. Indication: PAF Last office visit: 10/23/23  Shawnie Dapper NP Scr: 0.77 on 12/01/23  Epic Age:  63 Weight: 86.9kg  Based on above findings Eliquis 5mg  twice daily is the appropriate dose.  Refill approved.

## 2024-01-02 ENCOUNTER — Other Ambulatory Visit: Payer: Self-pay | Admitting: "Endocrinology

## 2024-01-02 ENCOUNTER — Other Ambulatory Visit: Payer: Self-pay | Admitting: Psychiatry

## 2024-01-02 ENCOUNTER — Other Ambulatory Visit: Payer: Self-pay | Admitting: Physician Assistant

## 2024-01-02 DIAGNOSIS — F4310 Post-traumatic stress disorder, unspecified: Secondary | ICD-10-CM

## 2024-01-02 NOTE — Progress Notes (Signed)
 Remote pacemaker transmission.

## 2024-01-04 DIAGNOSIS — D839 Common variable immunodeficiency, unspecified: Secondary | ICD-10-CM | POA: Diagnosis not present

## 2024-01-09 NOTE — Progress Notes (Deleted)
 GI Office Note    Referring Provider: Benita Stabile, MD Primary Care Physician:  Benita Stabile, MD  Primary Gastroenterologist: Hennie Duos. Marletta Lor, DO   Chief Complaint   No chief complaint on file.   History of Present Illness   Laura [name] is a 63 y.o. female presenting today for follow up. Last seen 10/2023. H/o GERD, constipation, chronic intermittent mild thrombocytopenia.    EGD 11/2023: -zline regular -gastritis s/p bx. No h.pylori. -normal duodenal bulb, first portion of duodenum and second portion of duodenum     Medications   Current Outpatient Medications  Medication Sig Dispense Refill   apixaban (ELIQUIS) 5 MG TABS tablet TAKE 1 TABLET BY MOUTH TWICE  DAILY 200 tablet 1   atorvastatin (LIPITOR) 20 MG tablet Take 20 mg by mouth at bedtime.     Azelastine HCl 137 MCG/SPRAY SOLN Place 1 spray into both nostrils daily. 90 mL 1   BD PEN NEEDLE NANO 2ND GEN 32G X 4 MM MISC 1 each by Other route in the morning, at noon, in the evening, and at bedtime. 90 each 0   bisoprolol (ZEBETA) 10 MG tablet Take 1 tablet (10 mg total) by mouth daily. 90 tablet 3   blood glucose meter kit and supplies 1 each by Other route 4 (four) times daily. Dispense based on patient and insurance preference. Use up to four times daily as directed. (FOR ICD-10 E10.9, E11.9). 1 each 0   Cholecalciferol (VITAMIN D3) 125 MCG (5000 UT) CAPS Take 1 capsule (5,000 Units total) by mouth daily. 90 capsule 0   Continuous Glucose Sensor (FREESTYLE LIBRE 3 SENSOR) MISC 1 Piece by Does not apply route every 14 (fourteen) days. Place 1 sensor on the skin every 14 days. Use to check glucose continuously 6 each 1   diltiazem (CARDIZEM CD) 300 MG 24 hr capsule Take 1 capsule (300 mg total) by mouth daily. 90 capsule 3   [START ON 02/10/2024] divalproex (DEPAKOTE ER) 500 MG 24 hr tablet Take 1 tablet (500 mg total) by mouth daily AND 2 tablets (1,000 mg total) at bedtime. 270 tablet 0   ELDERBERRY PO Take 4 g  by mouth daily. Gummies 2g each     empagliflozin (JARDIANCE) 10 MG TABS tablet Take 1 tablet (10 mg total) by mouth daily before breakfast. 90 tablet 1   EPINEPHrine 0.3 mg/0.3 mL IJ SOAJ injection Inject 0.3 mg into the muscle once as needed for anaphylaxis.     FASENRA PEN 30 MG/ML prefilled autoinjector INJECT 1 PEN SUBCUTANEOUSLY  EVERY 8 WEEKS 1 mL 8   FERROUS SULFATE PO Take 65 mg by mouth every evening.     fluticasone furoate-vilanterol (BREO ELLIPTA) 100-25 MCG/ACT AEPB Inhale 1 puff into the lungs daily. 60 each 5   gabapentin (NEURONTIN) 600 MG tablet Take 600 mg by mouth 3 (three) times daily.     hydrOXYzine (ATARAX) 25 MG tablet TAKE ONE TABLET BY MOUTH THREE TIMES DAILY AS NEEDED FOR ANXIETY OR FOR ITCHING 30 tablet 1   Immune Globulin, Human, (CUVITRU [place]) Inject 27 mg into the skin every 14 (fourteen) days.     insulin glargine (LANTUS SOLOSTAR) 100 UNIT/ML Solostar Pen Inject 30 Units into the skin at bedtime. 30 mL 1   Insulin Pen Needle (PEN NEEDLES 3/16") 31G X 5 MM MISC Use as directed with insulin pen 100 each 0   levalbuterol (XOPENEX HFA) 45 MCG/ACT inhaler USE 2 INHALATIONS BY MOUTH EVERY 6 HOURS  AS NEEDED FOR WHEEZING 30 g 6   levalbuterol (XOPENEX) 1.25 MG/3ML nebulizer solution Use 1 vial in the nebulizer every 6 hours as needed for cough, wheeze, shortness of breath or chest tightness. 225 mL 1   levothyroxine (SYNTHROID, LEVOTHROID) 112 MCG tablet Take 112 mcg by mouth daily before breakfast.     linaclotide (LINZESS) 290 MCG CAPS capsule Take 1 capsule (290 mcg total) by mouth daily before breakfast. 90 capsule 3   losartan (COZAAR) 50 MG tablet Take 50 mg by mouth every morning.     MELATONIN GUMMIES PO Take 10 mg by mouth at bedtime.     metFORMIN (GLUCOPHAGE) 500 MG tablet Take 500 mg by mouth 2 (two) times daily.     montelukast (SINGULAIR) 10 MG tablet TAKE 1 TABLET BY MOUTH AT  BEDTIME 100 tablet 1   Multiple Vitamins-Minerals (HAIR SKIN & NAILS PO) Take 1  tablet by mouth daily.     Multiple Vitamins-Minerals (MULTIVITAMIN WITH MINERALS) tablet Take 1 tablet by mouth daily. Woman     Nebulizer MISC 1 Device by Other route as directed. Nebulizer tubing kit 1 each 1   ondansetron (ZOFRAN-ODT) 8 MG disintegrating tablet Take 8 mg by mouth daily as needed for vomiting or nausea.     ONETOUCH ULTRA test strip 1 each by Other route in the morning, at noon, in the evening, and at bedtime.     pantoprazole (PROTONIX) 40 MG tablet Take 1 tablet (40 mg total) by mouth 2 (two) times daily before a meal. 180 tablet 3   polyethylene glycol (MIRALAX / GLYCOLAX) 17 g packet Take 17 g by mouth daily as needed. (Patient taking differently: Take 17 g by mouth daily as needed for moderate constipation or severe constipation.) 14 each 0   Potassium Chloride ER 20 MEQ TBCR Take 20 mEq by mouth daily.     [START ON 01/22/2024] sertraline (ZOLOFT) 50 MG tablet Take 1 tablet (50 mg total) by mouth at bedtime. 90 tablet 0   Spacer/Aero-Holding Chambers DEVI 1 Device by Does not apply route as directed. 1 each 1   topiramate (TOPAMAX) 100 MG tablet TAKE 1 AND 1/2 TABLETS BY MOUTH  IN THE MORNING AND 1 TABLET IN  THE EVENING 225 tablet 2   torsemide (DEMADEX) 20 MG tablet TAKE 1 TABLET BY MOUTH DAILY FOR FLUID GAIN OF 3 LBS OVERNIGHT OR 5 LBS IN A WEEK 30 tablet 5   TRULICITY 4.5 MG/0.5ML SOAJ INJECT THE CONTENTS OF ONE PEN  SUBCUTANEOUSLY WEEKLY AS  DIRECTED 6 mL 3   No current facility-administered medications for this visit.    Allergies   Allergies as of 01/10/2024 - Review Complete 12/29/2023  Allergen Reaction Noted   Ativan [lorazepam] Hives 02/06/2018   Phenergan [promethazine hcl] Hives 02/06/2018     Past Medical History   Past Medical History:  Diagnosis Date   Anemia    Aortic atherosclerosis (HCC)    Asthma    Atrial fibrillation and flutter (HCC)    a.) CHA2DS2VASc = 6 (sex, HTN, TIA x2, vascular disease history, T2DM);  b.) DCCV ~ 2009; c.) DCCV  08/29/2018 (200J x1 --> SR with 1st degree AVB); d.) rate/rhythm maintained on oral diltiazem + bisoprolol; chronically anticoagulated with apixaban   Bipolar disorder (HCC)    Carpal tunnel syndrome of right wrist    CHB (complete heart block) (HCC)    a.) s/p St. Jude dual chamber PPM placement   CHF (congestive heart failure) (HCC)  Complication of anesthesia    a.) "allergic reaction" to perioperative analgesics + anesthetic medications in [place]; unsure of combination; not able to recount reactions/events --> states "I ended up in the ICU for 5 days"   Constipation    a.) on linaclotide   COPD (chronic obstructive pulmonary disease) (HCC)    DDD (degenerative disc disease), thoracic    GERD (gastroesophageal reflux disease)    H/O congenital atrial septal defect (ASD) repair    Hallux valgus of right foot    Hammertoe of second toe of right foot    History of 2019 novel coronavirus disease (COVID-19)    a.) 10/2020; b.) 05/2021   HLD (hyperlipidemia)    HTN (hypertension)    Hypogammaglobulinemia (HCC)    a.) on Curitru   Hypothyroid    IgE deficiency (HCC)    Long term current use of anticoagulant    a.) apixaban   Lumbar stenosis    Migraines    Mitral stenosis 02/08/2022   a.) TTE 02/08/2022: EF 60-65%, mild MAC, mild MV stenosis (peak grad 14.6 / mean grad 4.0)   Neuropathy    NSVT (nonsustained ventricular tachycardia) (HCC) 10/31/2018   a.) Holter 10/31/2018: 6 beat run   Obesity    OSA on CPAP    Osteoarthritis    Presence of permanent cardiac 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 Jude dual chamber device; placed for symptomatic bradycardia secondary to intermittent CHB   PTSD (post-traumatic stress disorder)    Pulmonary nodules    Recurrent sinusitis    Short-term memory loss    Sleep difficulties    a.) takes melatonin   Symptomatic bradycardia    a.) s/p St. Jude dual chamber PPM placement   Syncope    TIA (transient ischemic attack)    x3-last one in  2015   Tremors of nervous system    Type 2 diabetes mellitus (HCC)     Past Surgical History   Past Surgical History:  Procedure Laterality Date   ASD REPAIR  1968   BALLOON DILATION N/A 11/27/2023   Procedure: BALLOON DILATION;  Surgeon: Lanelle Bal, DO;  Location: [facility];  Service: Endoscopy;  Laterality: N/A;  9:00 am, asa 3   BIOPSY  01/26/2021   Procedure: BIOPSY;  Surgeon: Lanelle Bal, DO;  [facility]: [facility];  Service: Endoscopy;;  gastric   BIOPSY  11/27/2023   Procedure: BIOPSY;  Surgeon: Lanelle Bal, DO;  [facility];  Service: Endoscopy;;   CARDIOVERSION  2009   CARDIOVERSION N/A 08/29/2018   Procedure: CARDIOVERSION;  Surgeon: Antoine Poche, MD;  [facility];  Service: Endoscopy;  Laterality: N/A;   CARPAL TUNNEL RELEASE Bilateral    CESAREAN SECTION  1986, 1988, 1994   COLONOSCOPY WITH PROPOFOL N/A 01/26/2021   Procedure: COLONOSCOPY WITH PROPOFOL;  Surgeon: Lanelle Bal, DO;  [facility];  Service: Endoscopy;  Laterality: N/A;  am appt, diabetic   ESOPHAGOGASTRODUODENOSCOPY (EGD) WITH PROPOFOL N/A 01/26/2021   Procedure: ESOPHAGOGASTRODUODENOSCOPY (EGD) WITH PROPOFOL;  Surgeon: Lanelle Bal, DO;  [facility];  Service: Endoscopy;  Laterality: N/A;   ESOPHAGOGASTRODUODENOSCOPY (EGD) WITH PROPOFOL N/A 11/27/2023   Procedure: ESOPHAGOGASTRODUODENOSCOPY (EGD) WITH PROPOFOL;  Surgeon: Lanelle Bal, DO;  [facility];  Service: Endoscopy;  Laterality: N/A;  9:00 am, asa 3   HALLUX VALGUS [place] Right 07/29/2022   Procedure: HALLUX VALGUS;  Surgeon: Edwin Cap, DPM;  Location: [facility]  ORS;  Service: Podiatry;  Laterality: Right;   HAMMER TOE SURGERY Right 07/29/2022   Procedure: HAMMER TOE CORRECTION 2ND;  Surgeon: Edwin Cap, DPM;  Location: [facility] ORS;  Service: Podiatry;  Laterality: Right;   LEFT HEART CATH AND CORONARY ANGIOGRAPHY N/A 08/30/2019   Procedure:  LEFT HEART CATH AND CORONARY ANGIOGRAPHY;  Surgeon: Corky Crafts, MD;  Location: [facility] INVASIVE CV LAB;  Service: Cardiovascular;  Laterality: N/A;   NASAL SINUS SURGERY  2017   PACEMAKER IMPLANT N/A 03/03/2022   Procedure: PACEMAKER IMPLANT;  Surgeon: Marinus Maw, MD;  Location: [facility] INVASIVE CV LAB;  Service: Cardiovascular;  Laterality: N/A;   POLYPECTOMY  01/26/2021   Procedure: POLYPECTOMY;  Surgeon: Lanelle Bal, DO;  [facility]: [facility];  Service: Endoscopy;;  colon   TOOTH EXTRACTION N/A 07/04/2023   Procedure: DENTAL RESTORATION/EXTRACTIONS;  Surgeon: Ocie Doyne, DMD;  Location: [facility] OR;  Service: Oral Surgery;  Laterality: N/A;    Past Family History   Family History  Problem Relation Age of Onset   Hypertension Mother    Stroke Mother    Kidney disease Mother    Heart attack Mother    Alcohol abuse Mother    Other Father        ?colon cancer   Kidney disease Sister    Hypertension Sister    Bipolar disorder Sister    Atrial fibrillation Sister    Heart disease Brother    Prostate cancer Brother    Thyroid disease Daughter    Hashimoto's thyroiditis Daughter    Celiac disease Daughter    Allergic rhinitis Daughter    Stroke Maternal Aunt    Heart disease Maternal Aunt    Asthma Neg Hx     Past Social History   Social History   Socioeconomic History   Marital status: Widowed    Spouse name: Not on file   Number of children: 3   Years of education: 10   Highest education level: Not on file  Occupational History   Not on file  Tobacco Use   Smoking status: Former    Current packs/day: 0.00    Average packs/day: 1 pack/day for 28.0 years (28.0 ttl pk-yrs)    Types: Cigarettes    Start date: 62    Quit date: 2008    Years since quitting: 17.2   Smokeless tobacco: Never  Vaping Use   Vaping status: Never Used  Substance and Sexual Activity   Alcohol use: Not Currently   Drug use: Not Currently   Sexual activity: Not Currently     Birth control/protection: Post-menopausal  Other Topics Concern   Not on file  Social History Narrative   Right handed   Drinks caffeine   One story home   Social Drivers of Health   Financial Resource Strain: High Risk (01/06/2022)   Overall Financial Resource Strain (CARDIA)    Difficulty of Paying Living Expenses: Hard  Food Insecurity: No Food Insecurity (01/06/2022)   Hunger Vital Sign    Worried About Running Out of Food in the Last Year: Never true    Ran Out of Food in the Last Year: Never true  Transportation Needs: No Transportation Needs (01/06/2022)   PRAPARE - Administrator, Civil Service (Medical): No    Lack of Transportation (Non-Medical): No  Physical Activity: Insufficiently Active (01/06/2022)   Exercise Vital Sign    Days of Exercise per Week: 4 days    Minutes of Exercise per  Session: 30 min  Stress: No Stress Concern Present (01/06/2022)   [employer] of Occupational Health - Occupational Stress Questionnaire    Feeling of Stress : Only a little  Social Connections: Moderately Integrated (01/06/2022)   Social Connection and Isolation Panel [NHANES]    Frequency of Communication with Friends and Family: More than three times a week    Frequency of Social Gatherings with Friends and Family: More than three times a week    Attends Religious Services: More than 4 times per year    Active Member of [employer] or Organizations: Yes    Attends Banker Meetings: More than 4 times per year    Marital Status: Widowed  Intimate Partner Violence: Not At Risk (01/06/2022)   Humiliation, Afraid, Rape, and Kick questionnaire    Fear of Current or Ex-Partner: No    Emotionally Abused: No    Physically Abused: No    Sexually Abused: No    Review of Systems   General: Negative for anorexia, weight loss, fever, chills, fatigue, weakness. ENT: Negative for hoarseness, difficulty swallowing , nasal congestion. CV: Negative for chest pain, angina,  palpitations, dyspnea on exertion, peripheral edema.  Respiratory: Negative for dyspnea at rest, dyspnea on exertion, cough, sputum, wheezing.  GI: See history of present illness. GU:  Negative for dysuria, hematuria, urinary incontinence, urinary frequency, nocturnal urination.  Endo: Negative for unusual weight change.     Physical Exam   There were no vitals taken for this visit.   General: Well-nourished, well-developed in no acute distress.  Eyes: No icterus. Mouth: Oropharyngeal mucosa moist and pink , no lesions erythema or exudate. Lungs: Clear to auscultation bilaterally.  Heart: Regular rate and rhythm, no murmurs rubs or gallops.  Abdomen: Bowel sounds are normal, nontender, nondistended, no hepatosplenomegaly or masses,  no abdominal bruits or hernia , no rebound or guarding.  Rectal: ***  Extremities: No lower extremity edema. No clubbing or deformities. Neuro: Alert and oriented x 4   Skin: Warm and dry, no jaundice.   Psych: Alert and cooperative, normal mood and affect.  Labs   Lab Results  Component Value Date   IRON 105 11/15/2023   TIBC 296 11/15/2023   FERRITIN 110 11/15/2023   Lab Results  Component Value Date   VITAMINB12 545 11/15/2023   Lab Results  Component Value Date   FOLATE 11.1 11/15/2023   Lab Results  Component Value Date   WBC 7.3 10/06/2023   HGB 15.2 (H) 10/06/2023   HCT 42.9 10/06/2023   MCV 96.2 10/06/2023   PLT 143 (L) 10/06/2023   Lab Results  Component Value Date   ALT 16 11/15/2023   AST 17 11/15/2023   ALKPHOS 55 11/15/2023   BILITOT 0.4 11/15/2023   Lab Results  Component Value Date   NA 140 11/15/2023   CL 110 11/15/2023   K 4.3 11/15/2023   CO2 20 (L) 11/15/2023   BUN 18 11/15/2023   CREATININE 0.58 11/15/2023   EGFR 88.0 12/02/2023   CALCIUM 9.0 11/15/2023   PHOS 3.0 04/08/2021   ALBUMIN 3.7 11/15/2023   GLUCOSE 180 (H) 11/15/2023   Lab Results  Component Value Date   HGBA1C 7.0 07/12/2023     Imaging Studies   No results found.  Assessment       PLAN   ***   Leanna Battles. Melvyn Neth, MHS, PA-C [facility]

## 2024-01-10 ENCOUNTER — Ambulatory Visit: Payer: 59 | Admitting: Gastroenterology

## 2024-01-11 ENCOUNTER — Other Ambulatory Visit (HOSPITAL_COMMUNITY)
Admission: RE | Admit: 2024-01-11 | Discharge: 2024-01-11 | Disposition: A | Discharge status: Home / Self Care | Source: Ambulatory Visit | Attending: Psychiatry | Admitting: Psychiatry

## 2024-01-11 ENCOUNTER — Encounter: Payer: Self-pay | Admitting: Psychiatry

## 2024-01-11 DIAGNOSIS — Z79899 Other long term (current) drug therapy: Secondary | ICD-10-CM | POA: Insufficient documentation

## 2024-01-11 LAB — CBC
HCT: 42.7 % (ref 36.0–46.0)
Hemoglobin: 15.2 g/dL — ABNORMAL HIGH (ref 12.0–15.0)
MCH: 34.4 pg — ABNORMAL HIGH (ref 26.0–34.0)
MCHC: 35.6 g/dL (ref 30.0–36.0)
MCV: 96.6 fL (ref 80.0–100.0)
Platelets: 152 10*3/uL (ref 150–400)
RBC: 4.42 MIL/uL (ref 3.87–5.11)
RDW: 12.4 % (ref 11.5–15.5)
WBC: 3.6 10*3/uL — ABNORMAL LOW (ref 4.0–10.5)
nRBC: 0 % (ref 0.0–0.2)

## 2024-01-11 LAB — VALPROIC ACID LEVEL: Valproic Acid Lvl: 61 ug/mL (ref 50.0–100.0)

## 2024-01-11 NOTE — Progress Notes (Signed)
 Please advise her that the Depakote level is within the acceptable range and her platelet count is normal. I recommend continuing the current dose of Depakote. She does have a lower white blood cell count and higher hemoglobin compared to the reference range; I recommend that she follow up with her primary care provider for further evaluation.

## 2024-01-13 ENCOUNTER — Other Ambulatory Visit: Payer: Self-pay | Admitting: "Endocrinology

## 2024-01-14 NOTE — Progress Notes (Unsigned)
 GI Office Note    Referring Provider: Benita Stabile, MD Primary Care Physician:  Benita Stabile, MD  Primary Gastroenterologist:  Chief Complaint   No chief complaint on file.   History of Present Illness   Laura [name] is a 63 y.o. female presenting today  n, chronic intermittent mild thrombocytopenia.      EGD 11/2023: -zline regular -gastritis s/p bx. No h.pylori. -normal duodenal bulb, first portion of duodenum and second portion of duodenum              Medications   Current Outpatient Medications  Medication Sig Dispense Refill   apixaban (ELIQUIS) 5 MG TABS tablet TAKE 1 TABLET BY MOUTH TWICE  DAILY 200 tablet 1   atorvastatin (LIPITOR) 20 MG tablet Take 20 mg by mouth at bedtime.     Azelastine HCl 137 MCG/SPRAY SOLN Place 1 spray into both nostrils daily. 90 mL 1   BD PEN NEEDLE NANO 2ND GEN 32G X 4 MM MISC 1 each by Other route in the morning, at noon, in the evening, and at bedtime. 90 each 0   bisoprolol (ZEBETA) 10 MG tablet Take 1 tablet (10 mg total) by mouth daily. 90 tablet 3   blood glucose meter kit and supplies 1 each by Other route 4 (four) times daily. Dispense based on patient and insurance preference. Use up to four times daily as directed. (FOR ICD-10 E10.9, E11.9). 1 each 0   Cholecalciferol (VITAMIN D3) 125 MCG (5000 UT) CAPS Take 1 capsule (5,000 Units total) by mouth daily. 90 capsule 0   Continuous Glucose Sensor (FREESTYLE LIBRE 3 SENSOR) MISC 1 Piece by Does not apply route every 14 (fourteen) days. Place 1 sensor on the skin every 14 days. Use to check glucose continuously 6 each 1   diltiazem (CARDIZEM CD) 300 MG 24 hr capsule Take 1 capsule (300 mg total) by mouth daily. 90 capsule 3   [START ON 02/10/2024] divalproex (DEPAKOTE ER) 500 MG 24 hr tablet Take 1 tablet (500 mg total) by mouth daily AND 2 tablets (1,000 mg total) at bedtime. 270 tablet 0   ELDERBERRY PO Take 4 g by mouth daily. Gummies 2g each     empagliflozin  (JARDIANCE) 10 MG TABS tablet Take 1 tablet (10 mg total) by mouth daily before breakfast. 90 tablet 1   EPINEPHrine 0.3 mg/0.3 mL IJ SOAJ injection Inject 0.3 mg into the muscle once as needed for anaphylaxis.     FASENRA PEN 30 MG/ML prefilled autoinjector INJECT 1 PEN SUBCUTANEOUSLY  EVERY 8 WEEKS 1 mL 8   FERROUS SULFATE PO Take 65 mg by mouth every evening.     fluticasone furoate-vilanterol (BREO ELLIPTA) 100-25 MCG/ACT AEPB Inhale 1 puff into the lungs daily. 60 each 5   gabapentin (NEURONTIN) 600 MG tablet Take 600 mg by mouth 3 (three) times daily.     hydrOXYzine (ATARAX) 25 MG tablet TAKE ONE TABLET BY MOUTH THREE TIMES DAILY AS NEEDED FOR ANXIETY OR FOR ITCHING 30 tablet 1   Immune Globulin, Human, (CUVITRU Parmer) Inject 27 mg into the skin every 14 (fourteen) days.     insulin glargine (LANTUS SOLOSTAR) 100 UNIT/ML Solostar Pen Inject 30 Units into the skin at bedtime. 30 mL 1   Insulin Pen Needle (PEN NEEDLES 3/16") 31G X 5 MM MISC Use as directed with insulin pen 100 each 0   levalbuterol (XOPENEX HFA) 45 MCG/ACT inhaler USE 2 INHALATIONS BY MOUTH EVERY 6 HOURS AS  NEEDED FOR WHEEZING 30 g 6   levalbuterol (XOPENEX) 1.25 MG/3ML nebulizer solution Use 1 vial in the nebulizer every 6 hours as needed for cough, wheeze, shortness of breath or chest tightness. 225 mL 1   levothyroxine (SYNTHROID, LEVOTHROID) 112 MCG tablet Take 112 mcg by mouth daily before breakfast.     linaclotide (LINZESS) 290 MCG CAPS capsule Take 1 capsule (290 mcg total) by mouth daily before breakfast. 90 capsule 3   losartan (COZAAR) 50 MG tablet Take 50 mg by mouth every morning.     MELATONIN GUMMIES PO Take 10 mg by mouth at bedtime.     metFORMIN (GLUCOPHAGE) 500 MG tablet Take 500 mg by mouth 2 (two) times daily.     montelukast (SINGULAIR) 10 MG tablet TAKE 1 TABLET BY MOUTH AT  BEDTIME 100 tablet 1   Multiple Vitamins-Minerals (HAIR SKIN & NAILS PO) Take 1 tablet by mouth daily.     Multiple Vitamins-Minerals  (MULTIVITAMIN WITH MINERALS) tablet Take 1 tablet by mouth daily. Woman     Nebulizer MISC 1 Device by Other route as directed. Nebulizer tubing kit 1 each 1   ondansetron (ZOFRAN-ODT) 8 MG disintegrating tablet Take 8 mg by mouth daily as needed for vomiting or nausea.     ONETOUCH ULTRA test strip 1 each by Other route in the morning, at noon, in the evening, and at bedtime.     pantoprazole (PROTONIX) 40 MG tablet Take 1 tablet (40 mg total) by mouth 2 (two) times daily before a meal. 180 tablet 3   polyethylene glycol (MIRALAX / GLYCOLAX) 17 g packet Take 17 g by mouth daily as needed. (Patient taking differently: Take 17 g by mouth daily as needed for moderate constipation or severe constipation.) 14 each 0   Potassium Chloride ER 20 MEQ TBCR Take 20 mEq by mouth daily.     [START ON 01/22/2024] sertraline (ZOLOFT) 50 MG tablet Take 1 tablet (50 mg total) by mouth at bedtime. 90 tablet 0   Spacer/Aero-Holding Chambers DEVI 1 Device by Does not apply route as directed. 1 each 1   topiramate (TOPAMAX) 100 MG tablet TAKE 1 AND 1/2 TABLETS BY MOUTH  IN THE MORNING AND 1 TABLET IN  THE EVENING 225 tablet 2   torsemide (DEMADEX) 20 MG tablet TAKE 1 TABLET BY MOUTH DAILY FOR FLUID GAIN OF 3 LBS OVERNIGHT OR 5 LBS IN A WEEK 30 tablet 5   TRULICITY 4.5 MG/0.5ML SOAJ INJECT THE CONTENTS OF ONE PEN  SUBCUTANEOUSLY WEEKLY AS  DIRECTED 6 mL 3   No current facility-administered medications for this visit.    Allergies   Allergies as of 01/15/2024 - Review Complete 12/29/2023  Allergen Reaction Noted   Ativan [lorazepam] Hives 02/06/2018   Phenergan [promethazine hcl] Hives 02/06/2018     Past Medical History   Past Medical History:  Diagnosis Date   Anemia    Aortic atherosclerosis (HCC)    Asthma    Atrial fibrillation and flutter (HCC)    a.) CHA2DS2VASc = 6 (sex, HTN, TIA x2, vascular disease history, T2DM);  b.) DCCV ~ 2009; c.) DCCV 08/29/2018 (200J x1 --> SR with 1st degree AVB); d.)  rate/rhythm maintained on oral diltiazem + bisoprolol; chronically anticoagulated with apixaban   Bipolar disorder (HCC)    Carpal tunnel syndrome of right wrist    CHB (complete heart block) (HCC)    a.) s/p St. Jude dual chamber PPM placement   CHF (congestive heart failure) (HCC)  Complication of anesthesia    a.) "allergic reaction" to perioperative analgesics + anesthetic medications in [place]; unsure of combination; not able to recount reactions/events --> states "I ended up in the ICU for 5 days"   Constipation    a.) on linaclotide   COPD (chronic obstructive pulmonary disease) (HCC)    DDD (degenerative disc disease), thoracic    GERD (gastroesophageal reflux disease)    H/O congenital atrial septal defect (ASD) repair    Hallux valgus of right foot    Hammertoe of second toe of right foot    History of 2019 novel coronavirus disease (COVID-19)    a.) 10/2020; b.) 05/2021   HLD (hyperlipidemia)    HTN (hypertension)    Hypogammaglobulinemia (HCC)    a.) on Curitru   Hypothyroid    IgE deficiency (HCC)    Long term current use of anticoagulant    a.) apixaban   Lumbar stenosis    Migraines    Mitral stenosis 02/08/2022   a.) TTE 02/08/2022: EF 60-65%, mild MAC, mild MV stenosis (peak grad 14.6 / mean grad 4.0)   Neuropathy    NSVT (nonsustained ventricular tachycardia) (HCC) 10/31/2018   a.) Holter 10/31/2018: 6 beat run   Obesity    OSA on CPAP    Osteoarthritis    Presence of permanent cardiac 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 Jude dual chamber device; placed for symptomatic bradycardia secondary to intermittent CHB   PTSD (post-traumatic stress disorder)    Pulmonary nodules    Recurrent sinusitis    Short-term memory loss    Sleep difficulties    a.) takes melatonin   Symptomatic bradycardia    a.) s/p St. Jude dual chamber PPM placement   Syncope    TIA (transient ischemic attack)    x3-last one in 2015   Tremors of nervous system    Type 2 diabetes  mellitus (HCC)     Past Surgical History   Past Surgical History:  Procedure Laterality Date   ASD REPAIR  1968   BALLOON DILATION N/A 11/27/2023   Procedure: BALLOON DILATION;  Surgeon: Lanelle Bal, DO;  Location: [facility];  Service: Endoscopy;  Laterality: N/A;  9:00 am, asa 3   BIOPSY  01/26/2021   Procedure: BIOPSY;  Surgeon: Lanelle Bal, DO;  [facility]: [facility];  Service: Endoscopy;;  gastric   BIOPSY  11/27/2023   Procedure: BIOPSY;  Surgeon: Lanelle Bal, DO;  [facility]: [facility];  Service: Endoscopy;;   CARDIOVERSION  2009   CARDIOVERSION N/A 08/29/2018   Procedure: CARDIOVERSION;  Surgeon: Antoine Poche, MD;  [facility]: [facility];  Service: Endoscopy;  Laterality: N/A;   CARPAL TUNNEL RELEASE Bilateral    CESAREAN SECTION  1986, 1988, 1994   COLONOSCOPY WITH PROPOFOL N/A 01/26/2021   Procedure: COLONOSCOPY WITH PROPOFOL;  Surgeon: Lanelle Bal, DO;  [facility]: [facility];  Service: Endoscopy;  Laterality: N/A;  am appt, diabetic   ESOPHAGOGASTRODUODENOSCOPY (EGD) WITH PROPOFOL N/A 01/26/2021   Procedure: ESOPHAGOGASTRODUODENOSCOPY (EGD) WITH PROPOFOL;  Surgeon: Lanelle Bal, DO;  [facility];  Service: Endoscopy;  Laterality: N/A;   ESOPHAGOGASTRODUODENOSCOPY (EGD) WITH PROPOFOL N/A 11/27/2023   Procedure: ESOPHAGOGASTRODUODENOSCOPY (EGD) WITH PROPOFOL;  Surgeon: Lanelle Bal, DO;  [facility];  Service: Endoscopy;  Laterality: N/A;  9:00 am, asa 3   HALLUX VALGUS [place] Right 07/29/2022   Procedure: HALLUX VALGUS;  Surgeon: Edwin Cap, DPM;  Location: [facility]  ORS;  Service: Podiatry;  Laterality: Right;   HAMMER TOE SURGERY Right 07/29/2022   Procedure: HAMMER TOE CORRECTION 2ND;  Surgeon: Edwin Cap, DPM;  Location: [facility] ORS;  Service: Podiatry;  Laterality: Right;   LEFT HEART CATH AND CORONARY ANGIOGRAPHY N/A 08/30/2019   Procedure: LEFT HEART CATH AND CORONARY ANGIOGRAPHY;  Surgeon:  Corky Crafts, MD;  Location: [facility] INVASIVE CV LAB;  Service: Cardiovascular;  Laterality: N/A;   NASAL SINUS SURGERY  2017   PACEMAKER IMPLANT N/A 03/03/2022   Procedure: PACEMAKER IMPLANT;  Surgeon: Marinus Maw, MD;  Location: [facility] INVASIVE CV LAB;  Service: Cardiovascular;  Laterality: N/A;   POLYPECTOMY  01/26/2021   Procedure: POLYPECTOMY;  Surgeon: Lanelle Bal, DO;  Location: [facility];  Service: Endoscopy;;  colon   TOOTH EXTRACTION N/A 07/04/2023   Procedure: DENTAL RESTORATION/EXTRACTIONS;  Surgeon: Ocie Doyne, DMD;  Location: [facility] OR;  Service: Oral Surgery;  Laterality: N/A;    Past Family History   Family History  Problem Relation Age of Onset   Hypertension Mother    Stroke Mother    Kidney disease Mother    Heart attack Mother    Alcohol abuse Mother    Other Father        ?colon cancer   Kidney disease Sister    Hypertension Sister    Bipolar disorder Sister    Atrial fibrillation Sister    Heart disease Brother    Prostate cancer Brother    Thyroid disease Daughter    Hashimoto's thyroiditis Daughter    Celiac disease Daughter    Allergic rhinitis Daughter    Stroke Maternal Aunt    Heart disease Maternal Aunt    Asthma Neg Hx     Past Social History   Social History   Socioeconomic History   Marital status: Widowed    Spouse name: Not on file   Number of children: 3   Years of education: 10   Highest education level: Not on file  Occupational History   Not on file  Tobacco Use   Smoking status: Former    Current packs/day: 0.00    Average packs/day: 1 pack/day for 28.0 years (28.0 ttl pk-yrs)    Types: Cigarettes    Start date: 82    Quit date: 2008    Years since quitting: 17.2   Smokeless tobacco: Never  Vaping Use   Vaping status: Never Used  Substance and Sexual Activity   Alcohol use: Not Currently   Drug use: Not Currently   Sexual activity: Not Currently    Birth control/protection: Post-menopausal  Other  Topics Concern   Not on file  Social History Narrative   Right handed   Drinks caffeine   One story home   Social Drivers of Health   Financial Resource Strain: High Risk (01/06/2022)   Overall Financial Resource Strain (CARDIA)    Difficulty of Paying Living Expenses: Hard  Food Insecurity: No Food Insecurity (01/06/2022)   Hunger Vital Sign    Worried About Running Out of Food in the Last Year: Never true    Ran Out of Food in the Last Year: Never true  Transportation Needs: No Transportation Needs (01/06/2022)   PRAPARE - Administrator, Civil Service (Medical): No    Lack of Transportation (Non-Medical): No  Physical Activity: Insufficiently Active (01/06/2022)   Exercise Vital Sign    Days of Exercise per Week: 4 days    Minutes of Exercise per  Session: 30 min  Stress: No Stress Concern Present (01/06/2022)   [employer] of Occupational Health - Occupational Stress Questionnaire    Feeling of Stress : Only a little  Social Connections: Moderately Integrated (01/06/2022)   Social Connection and Isolation Panel [NHANES]    Frequency of Communication with Friends and Family: More than three times a week    Frequency of Social Gatherings with Friends and Family: More than three times a week    Attends Religious Services: More than 4 times per year    Active Member of [employer] or Organizations: Yes    Attends Banker Meetings: More than 4 times per year    Marital Status: Widowed  Intimate Partner Violence: Not At Risk (01/06/2022)   Humiliation, Afraid, Rape, and Kick questionnaire    Fear of Current or Ex-Partner: No    Emotionally Abused: No    Physically Abused: No    Sexually Abused: No    Review of Systems   General: Negative for anorexia, weight loss, fever, chills, fatigue, weakness. ENT: Negative for hoarseness, difficulty swallowing , nasal congestion. CV: Negative for chest pain, angina, palpitations, dyspnea on exertion, peripheral edema.   Respiratory: Negative for dyspnea at rest, dyspnea on exertion, cough, sputum, wheezing.  GI: See history of present illness. GU:  Negative for dysuria, hematuria, urinary incontinence, urinary frequency, nocturnal urination.  Endo: Negative for unusual weight change.     Physical Exam   There were no vitals taken for this visit.   General: Well-nourished, well-developed in no acute distress.  Eyes: No icterus. Mouth: Oropharyngeal mucosa moist and pink , no lesions erythema or exudate. Lungs: Clear to auscultation bilaterally.  Heart: Regular rate and rhythm, no murmurs rubs or gallops.  Abdomen: Bowel sounds are normal, nontender, nondistended, no hepatosplenomegaly or masses,  no abdominal bruits or hernia , no rebound or guarding.  Rectal: ***  Extremities: No lower extremity edema. No clubbing or deformities. Neuro: Alert and oriented x 4   Skin: Warm and dry, no jaundice.   Psych: Alert and cooperative, normal mood and affect.  Labs   Lab Results  Component Value Date   NA 140 11/15/2023   CL 110 11/15/2023   K 4.3 11/15/2023   CO2 20 (L) 11/15/2023   BUN 18 11/15/2023   CREATININE 0.58 11/15/2023   EGFR 88.0 12/02/2023   CALCIUM 9.0 11/15/2023   PHOS 3.0 04/08/2021   ALBUMIN 3.7 11/15/2023   GLUCOSE 180 (H) 11/15/2023   Lab Results  Component Value Date   ALT 16 11/15/2023   AST 17 11/15/2023   ALKPHOS 55 11/15/2023   BILITOT 0.4 11/15/2023   Lab Results  Component Value Date   WBC 3.6 (L) 01/11/2024   HGB 15.2 (H) 01/11/2024   HCT 42.7 01/11/2024   MCV 96.6 01/11/2024   PLT 152 01/11/2024   Lab Results  Component Value Date   VITAMINB12 545 11/15/2023   Lab Results  Component Value Date   FOLATE 11.1 11/15/2023   Lab Results  Component Value Date   IRON 105 11/15/2023   TIBC 296 11/15/2023   FERRITIN 110 11/15/2023    Imaging Studies   No results found.  Assessment       PLAN   ***   Leanna Battles. Melvyn Neth, MHS, PA-C [facility]

## 2024-01-15 ENCOUNTER — Other Ambulatory Visit: Payer: Self-pay | Admitting: *Deleted

## 2024-01-15 ENCOUNTER — Encounter: Payer: Self-pay | Admitting: Gastroenterology

## 2024-01-15 ENCOUNTER — Ambulatory Visit (INDEPENDENT_AMBULATORY_CARE_PROVIDER_SITE_OTHER): Admitting: Gastroenterology

## 2024-01-15 ENCOUNTER — Telehealth: Payer: Self-pay | Admitting: *Deleted

## 2024-01-15 VITALS — BP 126/60 | HR 81 | Temp 98.7°F | Ht 64.0 in | Wt 185.8 lb

## 2024-01-15 DIAGNOSIS — R198 Other specified symptoms and signs involving the digestive system and abdomen: Secondary | ICD-10-CM | POA: Insufficient documentation

## 2024-01-15 DIAGNOSIS — R634 Abnormal weight loss: Secondary | ICD-10-CM | POA: Diagnosis not present

## 2024-01-15 DIAGNOSIS — K219 Gastro-esophageal reflux disease without esophagitis: Secondary | ICD-10-CM

## 2024-01-15 DIAGNOSIS — D72819 Decreased white blood cell count, unspecified: Secondary | ICD-10-CM | POA: Insufficient documentation

## 2024-01-15 DIAGNOSIS — Z860101 Personal history of adenomatous and serrated colon polyps: Secondary | ICD-10-CM | POA: Diagnosis not present

## 2024-01-15 DIAGNOSIS — Z800 Family history of malignant neoplasm of digestive organs: Secondary | ICD-10-CM | POA: Diagnosis not present

## 2024-01-15 DIAGNOSIS — K5900 Constipation, unspecified: Secondary | ICD-10-CM | POA: Diagnosis not present

## 2024-01-15 DIAGNOSIS — D696 Thrombocytopenia, unspecified: Secondary | ICD-10-CM | POA: Diagnosis not present

## 2024-01-15 MED ORDER — LANSOPRAZOLE 30 MG PO CPDR: 30.0000 mg | DELAYED_RELEASE_CAPSULE | Freq: Two times a day (BID) | ORAL | 3 refills | Status: DC

## 2024-01-15 NOTE — Telephone Encounter (Signed)
   Patient Name: Laura [name]  DOB: 1961/01/03 MRN: [id number]  Primary Cardiologist: Dina Rich, MD  Clinical pharmacists have reviewed the patient's past medical history, labs, and current medications as part of preoperative protocol coverage. The following recommendations have been made:   Per office protocol, patient can hold Eliquis for 2 days prior to procedure.     I will route this recommendation to the requesting party via Epic fax function and remove from pre-op pool.  Please call with questions.  Denyce Robert, NP 01/15/2024, 1:23 PM

## 2024-01-15 NOTE — Telephone Encounter (Signed)
Please advise holding Eliquis prior to colonoscopy.  Thank you!  DW  

## 2024-01-15 NOTE — Telephone Encounter (Signed)
  Request for patient to stop medication prior to procedure or is needing cleareance  01/15/24  Laura [name] Apr 15, 1961  What type of surgery is being performed? Colonoscopy  When is surgery scheduled? TBD  What type of clearance is required (medical or pharmacy to hold medication or both? medication  Are there any medications that need to be held prior to surgery and how long? Eliquis x 2 days  Name of physician performing surgery?  Dr.Charles Verl [facility] Phone: [phone] Fax: [phone]  Anethesia type (none, local, MAC, general)? MAC

## 2024-01-15 NOTE — Telephone Encounter (Signed)
 Patient with diagnosis of Afib on Eliquis for anticoagulation.    Procedure: Colonoscopy Date of procedure: TBD   CHA2DS2-VASc Score = 5   This indicates a 7.2% annual risk of stroke. The patient's score is based upon: CHF History: 1 HTN History: 0 Diabetes History: 1 Stroke History: 2 Vascular Disease History: 0 Age Score: 0 Gender Score: 1   CrCl 105 mL/min Platelet count 152 K   Per office protocol, patient can hold Eliquis for 2 days prior to procedure.      **This guidance is not considered finalized until pre-operative APP has relayed final recommendations.**

## 2024-01-15 NOTE — Patient Instructions (Signed)
 Stop pantoprazole.  Start lansoprazole 30 mg twice daily before breakfast and evening meal. Refer to hematology regarding chronic intermittent low white blood cell count, platelet count. We will get you scheduled for colonoscopy in the near future. Continue Linzess daily.

## 2024-01-17 DIAGNOSIS — E1142 Type 2 diabetes mellitus with diabetic polyneuropathy: Secondary | ICD-10-CM | POA: Diagnosis not present

## 2024-01-17 DIAGNOSIS — K5900 Constipation, unspecified: Secondary | ICD-10-CM | POA: Diagnosis not present

## 2024-01-17 DIAGNOSIS — E1169 Type 2 diabetes mellitus with other specified complication: Secondary | ICD-10-CM | POA: Diagnosis not present

## 2024-01-17 DIAGNOSIS — K219 Gastro-esophageal reflux disease without esophagitis: Secondary | ICD-10-CM | POA: Diagnosis not present

## 2024-01-17 DIAGNOSIS — R0789 Other chest pain: Secondary | ICD-10-CM | POA: Diagnosis not present

## 2024-01-17 DIAGNOSIS — D696 Thrombocytopenia, unspecified: Secondary | ICD-10-CM | POA: Diagnosis not present

## 2024-01-17 DIAGNOSIS — I10 Essential (primary) hypertension: Secondary | ICD-10-CM | POA: Diagnosis not present

## 2024-01-18 ENCOUNTER — Inpatient Hospital Stay: Attending: Oncology | Admitting: Oncology

## 2024-01-18 ENCOUNTER — Inpatient Hospital Stay

## 2024-01-18 ENCOUNTER — Encounter: Payer: Self-pay | Admitting: Oncology

## 2024-01-18 VITALS — BP 181/72 | HR 69 | Temp 97.9°F | Resp 16 | Ht 64.0 in | Wt 187.0 lb

## 2024-01-18 DIAGNOSIS — R198 Other specified symptoms and signs involving the digestive system and abdomen: Secondary | ICD-10-CM

## 2024-01-18 DIAGNOSIS — D72819 Decreased white blood cell count, unspecified: Secondary | ICD-10-CM

## 2024-01-18 DIAGNOSIS — D696 Thrombocytopenia, unspecified: Secondary | ICD-10-CM | POA: Diagnosis not present

## 2024-01-18 DIAGNOSIS — Z800 Family history of malignant neoplasm of digestive organs: Secondary | ICD-10-CM | POA: Insufficient documentation

## 2024-01-18 DIAGNOSIS — F17200 Nicotine dependence, unspecified, uncomplicated: Secondary | ICD-10-CM | POA: Insufficient documentation

## 2024-01-18 DIAGNOSIS — D802 Selective deficiency of immunoglobulin A [IgA]: Secondary | ICD-10-CM | POA: Insufficient documentation

## 2024-01-18 DIAGNOSIS — R112 Nausea with vomiting, unspecified: Secondary | ICD-10-CM | POA: Diagnosis not present

## 2024-01-18 DIAGNOSIS — R634 Abnormal weight loss: Secondary | ICD-10-CM | POA: Insufficient documentation

## 2024-01-18 DIAGNOSIS — D839 Common variable immunodeficiency, unspecified: Secondary | ICD-10-CM | POA: Diagnosis not present

## 2024-01-18 LAB — CBC WITH DIFFERENTIAL/PLATELET
Abs Immature Granulocytes: 0.04 10*3/uL (ref 0.00–0.07)
Basophils Absolute: 0.1 10*3/uL (ref 0.0–0.1)
Basophils Relative: 1 %
Eosinophils Absolute: 0 10*3/uL (ref 0.0–0.5)
Eosinophils Relative: 0 %
HCT: 44.7 % (ref 36.0–46.0)
Hemoglobin: 15.2 g/dL — ABNORMAL HIGH (ref 12.0–15.0)
Immature Granulocytes: 1 %
Lymphocytes Relative: 34 %
Lymphs Abs: 1.9 10*3/uL (ref 0.7–4.0)
MCH: 33.3 pg (ref 26.0–34.0)
MCHC: 34 g/dL (ref 30.0–36.0)
MCV: 97.8 fL (ref 80.0–100.0)
Monocytes Absolute: 0.4 10*3/uL (ref 0.1–1.0)
Monocytes Relative: 6 %
Neutro Abs: 3.2 10*3/uL (ref 1.7–7.7)
Neutrophils Relative %: 58 %
Platelets: 181 10*3/uL (ref 150–400)
RBC: 4.57 MIL/uL (ref 3.87–5.11)
RDW: 12.5 % (ref 11.5–15.5)
WBC: 5.5 10*3/uL (ref 4.0–10.5)
nRBC: 0 % (ref 0.0–0.2)

## 2024-01-18 LAB — COMPREHENSIVE METABOLIC PANEL WITH GFR
ALT: 17 U/L (ref 0–44)
AST: 18 U/L (ref 15–41)
Albumin: 3.9 g/dL (ref 3.5–5.0)
Alkaline Phosphatase: 61 U/L (ref 38–126)
Anion gap: 10 (ref 5–15)
BUN: 14 mg/dL (ref 8–23)
CO2: 19 mmol/L — ABNORMAL LOW (ref 22–32)
Calcium: 9.7 mg/dL (ref 8.9–10.3)
Chloride: 110 mmol/L (ref 98–111)
Creatinine, Ser: 0.64 mg/dL (ref 0.44–1.00)
GFR, Estimated: >60 mL/min (ref >60)
Glucose, Bld: 158 mg/dL — ABNORMAL HIGH (ref 70–99)
Potassium: 4.3 mmol/L (ref 3.5–5.1)
Sodium: 139 mmol/L (ref 135–145)
Total Bilirubin: 0.4 mg/dL (ref 0.0–1.2)
Total Protein: 7.5 g/dL (ref 6.5–8.1)

## 2024-01-18 LAB — HEPATITIS PANEL, ACUTE
HCV Ab: NONREACTIVE
Hep A IgM: NONREACTIVE
Hep B C IgM: NONREACTIVE
Hepatitis B Surface Ag: NONREACTIVE

## 2024-01-18 LAB — LACTATE DEHYDROGENASE: LDH: 154 U/L (ref 98–192)

## 2024-01-18 LAB — URIC ACID: Uric Acid, Serum: 5.7 mg/dL (ref 2.5–7.1)

## 2024-01-18 NOTE — Patient Instructions (Signed)
 VISIT SUMMARY:  During today's visit, we discussed your ongoing symptoms of chronic nausea and loss of appetite, which have led to significant weight loss. We also reviewed your history of abnormal blood results and your current treatments, including IVIG therapy for low IgA levels. Given your family history of colon cancer, we are taking a thorough approach to monitor your gastrointestinal symptoms.  YOUR PLAN:  -THROMBOCYTOPENIA: Thrombocytopenia means having a lower than normal number of platelets in your blood. Your platelet count is borderline abnormal but stable, and your iron, B12, and folate levels are normal. We will monitor your platelet and white blood cell counts with a CBC and copper level test, and we will also order a hepatitis panel to check your liver function.  -GASTROINTESTINAL SYMPTOMS: Your persistent nausea, loss of appetite, and weight loss are concerning, especially given your family history of colon cancer. You are already under the care of a GI specialist and a nutritionist. Please continue your follow-ups with both specialists to manage your symptoms and dietary needs.  INSTRUCTIONS:  Please follow up with the GI specialist and nutritionist as planned. Additionally, we will need you to complete the ordered CBC, copper level, and hepatitis panel tests to monitor your blood counts and liver function.

## 2024-01-18 NOTE — Progress Notes (Signed)
 [facility]  HEMATOLOGY NEW VISIT  Laura Bickers, MD  REASON FOR REFERRAL: Thrombocytopenia  HISTORY OF PRESENT ILLNESS: Laura [name] 63 y.o. female referred for thrombocytopenia.  Patient reports chronic nausea and loss of appetite.  Patient has a history of eating disorder and currently is on treatment , she reports that these symptoms have led to significant weight loss, from 223 lbs to 187 lbs, over an unspecified period. The patient denies intentional weight loss and attributes the weight loss to her current symptoms. The patient is currently under the care of a gastroenterologist for her stomach issues and a nutritionist for her eating disorder.   The patient also reports a history of abnormal blood results, with fluctuating white blood cell count and borderline abnormal platelets.  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  She denies abdominal pain, fever, chills, abnormal bleeding, melena, hematochezia, loss of appetite.  Patient smokes occasionally and has significantly cut down, has not drank alcohol in the past 27 years.  She reports family history of colon cancer in her father.  I have reviewed the past medical history, past surgical history, social history and family history with the patient   ALLERGIES:  is allergic to ativan [lorazepam] and phenergan [promethazine hcl].  MEDICATIONS:  Current Outpatient Medications  Medication Sig Dispense Refill   apixaban (ELIQUIS) 5 MG TABS tablet TAKE 1 TABLET BY MOUTH TWICE  DAILY 200 tablet 1   atorvastatin (LIPITOR) 20 MG tablet Take 20 mg by mouth at bedtime.     Azelastine HCl 137 MCG/SPRAY SOLN Place 1 spray into both nostrils daily. 90 mL 1   BD PEN NEEDLE NANO 2ND GEN 32G X 4 MM MISC 1 each by Other route in the morning, at noon, in the evening, and at bedtime. 90 each 0   bisoprolol (ZEBETA) 10 MG tablet Take 1 tablet (10 mg total) by mouth daily. 90 tablet 3   blood glucose  meter kit and supplies 1 each by Other route 4 (four) times daily. Dispense based on patient and insurance preference. Use up to four times daily as directed. (FOR ICD-10 E10.9, E11.9). 1 each 0   Cholecalciferol (VITAMIN D3) 125 MCG (5000 UT) CAPS Take 1 capsule (5,000 Units total) by mouth daily. 90 capsule 0   Continuous Glucose Sensor (FREESTYLE LIBRE 3 SENSOR) MISC 1 Piece by Does not apply route every 14 (fourteen) days. Place 1 sensor on the skin every 14 days. Use to check glucose continuously 6 each 1   diltiazem (CARDIZEM CD) 300 MG 24 hr capsule Take 1 capsule (300 mg total) by mouth daily. 90 capsule 3   [START ON 02/10/2024] divalproex (DEPAKOTE ER) 500 MG 24 hr tablet Take 1 tablet (500 mg total) by mouth daily AND 2 tablets (1,000 mg total) at bedtime. 270 tablet 0   ELDERBERRY PO Take 4 g by mouth daily. Gummies 2g each     empagliflozin (JARDIANCE) 10 MG TABS tablet TAKE 1 TABLET BY MOUTH DAILY  BEFORE BREAKFAST 100 tablet 0   EPINEPHrine 0.3 mg/0.3 mL IJ SOAJ injection Inject 0.3 mg into the muscle once as needed for anaphylaxis.     FASENRA PEN 30 MG/ML prefilled autoinjector INJECT 1 PEN SUBCUTANEOUSLY  EVERY 8 WEEKS 1 mL 8   FERROUS SULFATE PO Take 65 mg by mouth every evening.     fluticasone furoate-vilanterol (BREO ELLIPTA) 100-25 MCG/ACT AEPB Inhale 1 puff into the lungs daily. 60 each 5  gabapentin (NEURONTIN) 600 MG tablet Take 600 mg by mouth 3 (three) times daily.     hydrOXYzine (ATARAX) 25 MG tablet TAKE ONE TABLET BY MOUTH THREE TIMES DAILY AS NEEDED FOR ANXIETY OR FOR ITCHING 30 tablet 1   Immune Globulin, Human, (CUVITRU [place]) Inject 27 mg into the skin every 14 (fourteen) days.     insulin glargine (LANTUS SOLOSTAR) 100 UNIT/ML Solostar Pen Inject 30 Units into the skin at bedtime. 30 mL 1   Insulin Pen Needle (PEN NEEDLES 3/16") 31G X 5 MM MISC Use as directed with insulin pen 100 each 0   lansoprazole (PREVACID) 30 MG capsule Take 1 capsule (30 mg total) by mouth 2  (two) times daily before a meal. 180 capsule 3   levalbuterol (XOPENEX HFA) 45 MCG/ACT inhaler USE 2 INHALATIONS BY MOUTH EVERY 6 HOURS AS NEEDED FOR WHEEZING 30 g 6   levalbuterol (XOPENEX) 1.25 MG/3ML nebulizer solution Use 1 vial in the nebulizer every 6 hours as needed for cough, wheeze, shortness of breath or chest tightness. 225 mL 1   levothyroxine (SYNTHROID, LEVOTHROID) 112 MCG tablet Take 112 mcg by mouth daily before breakfast.     linaclotide (LINZESS) 290 MCG CAPS capsule Take 1 capsule (290 mcg total) by mouth daily before breakfast. 90 capsule 3   losartan (COZAAR) 50 MG tablet Take 50 mg by mouth every morning.     MELATONIN GUMMIES PO Take 10 mg by mouth at bedtime.     metFORMIN (GLUCOPHAGE) 500 MG tablet Take 500 mg by mouth 2 (two) times daily.     montelukast (SINGULAIR) 10 MG tablet TAKE 1 TABLET BY MOUTH AT  BEDTIME 100 tablet 1   Multiple Vitamins-Minerals (HAIR SKIN & NAILS PO) Take 1 tablet by mouth daily.     Multiple Vitamins-Minerals (MULTIVITAMIN WITH MINERALS) tablet Take 1 tablet by mouth daily. Woman     Nebulizer MISC 1 Device by Other route as directed. Nebulizer tubing kit 1 each 1   ondansetron (ZOFRAN-ODT) 8 MG disintegrating tablet Take 8 mg by mouth daily as needed for vomiting or nausea.     ONETOUCH ULTRA test strip 1 each by Other route in the morning, at noon, in the evening, and at bedtime.     polyethylene glycol (MIRALAX / GLYCOLAX) 17 g packet Take 17 g by mouth daily as needed. (Patient taking differently: Take 17 g by mouth daily as needed for moderate constipation or severe constipation.) 14 each 0   Potassium Chloride ER 20 MEQ TBCR Take 20 mEq by mouth daily.     [START ON 01/22/2024] sertraline (ZOLOFT) 50 MG tablet Take 1 tablet (50 mg total) by mouth at bedtime. 90 tablet 0   Spacer/Aero-Holding Chambers DEVI 1 Device by Does not apply route as directed. 1 each 1   topiramate (TOPAMAX) 100 MG tablet TAKE 1 AND 1/2 TABLETS BY MOUTH  IN THE  MORNING AND 1 TABLET IN  THE EVENING 225 tablet 2   torsemide (DEMADEX) 20 MG tablet TAKE 1 TABLET BY MOUTH DAILY FOR FLUID GAIN OF 3 LBS OVERNIGHT OR 5 LBS IN A WEEK 30 tablet 5   TRULICITY 4.5 MG/0.5ML SOAJ INJECT THE CONTENTS OF ONE PEN  SUBCUTANEOUSLY WEEKLY AS  DIRECTED 6 mL 3   No current facility-administered medications for this visit.     REVIEW OF SYSTEMS:   Constitutional: Denies fevers, chills or night sweats Eyes: Denies blurriness of vision Ears, nose, mouth, throat, and face: Denies mucositis or sore throat  Respiratory: Denies cough, dyspnea or wheezes Cardiovascular: Denies palpitation, chest discomfort or lower extremity swelling Gastrointestinal:  Denies nausea, heartburn or change in bowel habits Skin: Denies abnormal skin rashes Lymphatics: Denies new lymphadenopathy or easy bruising Neurological:Denies numbness, tingling or new weaknesses Behavioral/Psych: Mood is stable, no new changes  All other systems were reviewed with the patient and are negative.  PHYSICAL EXAMINATION:   Vitals:   01/18/24 0903  BP: (!) 181/72  Pulse: 69  Resp: 16  Temp: 97.9 F (36.6 C)  SpO2: 99%    GENERAL:alert, no distress and comfortable SKIN: skin color, texture, turgor are normal, no rashes or significant lesions NECK: supple, thyroid normal size, non-tender, without nodularity LYMPH:  no palpable lymphadenopathy in the cervical, axillary or inguinal LUNGS: clear to auscultation and percussion with normal breathing effort HEART: regular rate & rhythm and no murmurs and no lower extremity edema ABDOMEN:abdomen soft, non-tender and normal bowel sounds Musculoskeletal:no cyanosis of digits and no clubbing  NEURO: alert & oriented x 3 with fluent speech  LABORATORY DATA:  I have reviewed the data as listed and labs from primary care office 12/01/2023 CBC: WBC: 4.9, hemoglobin: 15.4, hematocrit: 44.6, MCV: 100, platelets: 146 CMP: WNL  Lab Results  Component Value Date    WBC 5.5 01/18/2024   NEUTROABS 3.2 01/18/2024   HGB 15.2 (H) 01/18/2024   HCT 44.7 01/18/2024   MCV 97.8 01/18/2024   PLT 181 01/18/2024      Chemistry      Component Value Date/Time   NA 139 01/18/2024 0941   NA 141 03/03/2023 1121   K 4.3 01/18/2024 0941   CL 110 01/18/2024 0941   CO2 19 (L) 01/18/2024 0941   BUN 14 01/18/2024 0941   BUN 15 03/03/2023 1121   CREATININE 0.64 01/18/2024 0941   CREATININE 0.51 03/13/2018 1034   GLU 150 12/01/2022 0000      Component Value Date/Time   CALCIUM 9.7 01/18/2024 0941   ALKPHOS 61 01/18/2024 0941   AST 18 01/18/2024 0941   ALT 17 01/18/2024 0941   BILITOT 0.4 01/18/2024 0941   BILITOT 0.2 03/03/2023 1121   BILITOT 0.2 03/03/2023 1121       ASSESSMENT & PLAN:  Patient is a 63 y.o. female referred for thrombocytopenia  Thrombocytopenia (HCC) Patient has some chronic occasional low platelet counts of 140s. No episodes of abnormal bleeding Normal iron, B12 and folate levels  - Will repeat CBC today and obtain copper level. - Will obtain hepatitis panel to evaluate liver function impact on platelet count  Return to clinic in 2 weeks to discuss results  Leukopenia Patient has occasional low white blood cell count.  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  - Continue to follow with the allergist and receive IVIG as prescribed  Gastrointestinal symptoms Persistent nausea, anorexia, and weight loss.  Under GI specialist and nutritionist care.  Concern due to family history of colon cancer.  - Continue follow-up with GI specialist. - Continue working with nutritionist for dietary management.   Orders Placed This Encounter  Procedures   CBC  with Differential/Platelet    Standing Status:   Future    Number of Occurrences:   1  Expected Date:   01/18/2024    Expiration Date:   01/17/2025   Comprehensive metabolic panel with GFR    Standing Status:   Future    Number of Occurrences:   1    Expected Date:   01/18/2024    Expiration Date:   01/17/2025   Hepatitis panel, acute    Standing Status:   Future    Number of Occurrences:   1    Expected Date:   01/18/2024    Expiration Date:   01/17/2025    Release to patient:   Immediate [1]   Copper, serum    Standing Status:   Future    Number of Occurrences:   1    Expected Date:   01/18/2024    Expiration Date:   01/17/2025   Lactate dehydrogenase    Standing Status:   Future    Number of Occurrences:   1    Expected Date:   01/18/2024    Expiration Date:   01/17/2025   Uric acid    Standing Status:   Future    Number of Occurrences:   1    Expected Date:   01/18/2024    Expiration Date:   01/17/2025    The total time spent in the appointment was 60 minutes encounter with patients including review of chart and various tests results, discussions about plan of care and coordination of care plan   All questions were answered. The patient knows to call the clinic with any problems, questions or concerns. No barriers to learning was detected.   Eduardo Grade, MD 4/12/20259:42 AM

## 2024-01-19 ENCOUNTER — Telehealth: Payer: Self-pay | Admitting: Internal Medicine

## 2024-01-19 NOTE — Telephone Encounter (Signed)
 Copied from CRM [id number]. Topic: Appointments - Transfer of Care >> Jan 19, 2024  9:47 AM Crist Dominion wrote: Pt is requesting to transfer FROM: Dr. Celene Coins Pt is requesting to transfer TO: Dr. Vernestine Gondola Reason for requested transfer: Dr. Villa Greaser left [facility] It is the responsibility of the team the patient would like to transfer to (Dr. Vernestine Gondola) to reach out to the patient if for any reason this transfer is not acceptable.  01/22/24  Patient aware TOC has been approved by both providers----patient also requested to be placed on the wait list ---

## 2024-01-20 DIAGNOSIS — D802 Selective deficiency of immunoglobulin A [IgA]: Secondary | ICD-10-CM | POA: Insufficient documentation

## 2024-01-20 LAB — COPPER, SERUM: Copper: 114 ug/dL (ref 80–158)

## 2024-01-20 NOTE — Assessment & Plan Note (Signed)
 Patient has occasional low white blood cell count.  Last labs from 01/11/2024 showed white count of 3.6 No B symptoms Likely secondary to recent infection  - Will repeat CBC with differential today  Return to clinic in 2 weeks to discuss results and further management

## 2024-01-20 NOTE — Assessment & Plan Note (Signed)
 Patient has IgA deficiency and receives IVIG with the allergist in [place].  - Continue to follow with the allergist and receive IVIG as prescribed

## 2024-01-20 NOTE — Assessment & Plan Note (Signed)
 Persistent nausea, anorexia, and weight loss.  Under GI specialist and nutritionist care.  Concern due to family history of colon cancer.  - Continue follow-up with GI specialist. - Continue working with nutritionist for dietary management.

## 2024-01-20 NOTE — Assessment & Plan Note (Signed)
 Patient has some chronic occasional low platelet counts of 140s. No episodes of abnormal bleeding Normal iron, B12 and folate levels  - Will repeat CBC today and obtain copper level. - Will obtain hepatitis panel to evaluate liver function impact on platelet count  Return to clinic in 2 weeks to discuss results

## 2024-01-22 ENCOUNTER — Ambulatory Visit: Payer: 59 | Admitting: Physician Assistant

## 2024-01-23 ENCOUNTER — Encounter: Payer: Self-pay | Admitting: Registered"

## 2024-01-23 ENCOUNTER — Encounter: Attending: "Endocrinology | Admitting: Registered"

## 2024-01-23 DIAGNOSIS — E1165 Type 2 diabetes mellitus with hyperglycemia: Secondary | ICD-10-CM | POA: Diagnosis not present

## 2024-01-23 DIAGNOSIS — E119 Type 2 diabetes mellitus without complications: Secondary | ICD-10-CM | POA: Insufficient documentation

## 2024-01-23 NOTE — Progress Notes (Unsigned)
   [address] [place] [address] Dept: [phone]  FOLLOW UP NOTE  Patient ID: Laura [name], female    DOB: [birth year]  Age: 63 y.o. MRN: [id number] Date of Office Visit: 01/24/2024  Assessment  Chief Complaint: No chief complaint on file.  HPI Laura [name] is a 63 year old female who presents the the clinic for a follow up visit. She was last seen in this lcinic on 10/25/2023 by Dr. [name] for evaluation of asthma, non allergic rhinitis, and reflux. Her current problem list includes atrial fibrillation, headaches, diabetes, hypothyroidism, essential tremor, thrombocytopenia, and tobacco use.  Discussed the use of AI scribe software for clinical note transcription with the patient, who gave verbal consent to proceed.   History of Present Illness      Drug Allergies:  Allergies  Allergen Reactions   Ativan [Lorazepam] Hives   Phenergan [Promethazine Hcl] Hives    Physical Exam: There were no vitals taken for this visit.   Physical Exam  Diagnostics:    Assessment and Plan: No diagnosis found.  No orders of the defined types were placed in this encounter.   There are no Patient Instructions on file for this visit.  No follow-ups on file.    Thank you for the opportunity to care for this patient.  Please do not hesitate to contact me with questions.  Marinus Sic, FNP [facility]

## 2024-01-23 NOTE — Patient Instructions (Signed)
-   Aim to have 3 meals a day.

## 2024-01-23 NOTE — Patient Instructions (Incomplete)
1. Hypogammaglobulinemia - Continue with Cuvitru.  - We will continue with 27 g every 2 weeks. - We might consider increasing the dosage if this frequency of infections worsen.   2. Chronic rhinitis   - Continue with nasal saline rinses as needed.  - Continue with Nasacort daily as instructed by your primary care provider.  - You can use this with your Astepro/azelastine. - We can do skin testing at some point if needed.   3. Severe persistent asthma, uncomplicated Lung testing looks good today - Daily medication: Continue [id number] one puff once daily + montelukast 10 mg once a day + Fasenra every 8 weeks - Prior to physical activity: Xopenex 2 puffs 10-15 minutes before physical activity. - Rescue medications:  Xopenex nebulizer every 4-6 hours as needed, Xopenex 4 puffs every 4-6 hours as needed, or DuoNeb nebulizer one vial every 4-6 hours as needed - Asthma control goals:  * Full participation in all desired activities (may need albuterol before activity) * Albuterol use two time or less a week on average (not counting use with activity) * Cough interfering with sleep two time or less a month * Oral steroids no more than once a year * No hospitalizations.  4.  Reflux  -Continue dietary lifestyle modifications as listed below  -Continue omeprazole twice a day to control reflux.  Take this medication 30 minutes before a meal.    5.  Follow up in 6 months or sooner if needed   Please inform us of any Emergency Department visits, hospitalizations, or changes in symptoms. Call us before going to the ED for breathing or allergy symptoms since we might be able to fit you in for a sick visit. Feel free to contact us anytime with any questions, problems, or concerns.  It was a pleasure to see you again today!

## 2024-01-23 NOTE — Progress Notes (Unsigned)
 Appointment start time: 2:08  Appointment end time: 2:52  Patient was seen on 01/23/2024 for nutrition counseling pertaining to disordered eating  Primary care provider: Denman Fischer, MD Therapist: Secundino Dach (sees once/mont)  ROI: will complete at next appt Any other medical team members: endocrinologist   Assessment  States she is prepping for a colonoscopy and having labs done 4/21. States she has been having a lot of stomach problems. Reports having endoscopy about 6 weeks ago. States her stomach is messed up and she can't eat anything anymore. Having COVID took her appetite away.   States she has gastritis.   Reports she has lost a lot of weight. Reports history of eating disorder since age 63 due to restricting. States her thoughts have resurfaced since COVID in 10/2021.   Eating history: Length of time: since age 63 Previous treatments: none Goals for RD meetings: improve reflux, headaches, heart racing, challenges with focus/concentration, cold intolerance  Weight history:  Highest weight: 283   Lowest weight: 120 (during her 20s) Most consistent weight: 185  What would you like to weigh: How has weight changed in the past year: weight loss of 38 lbs  Medical Information:  Changes in hair, skin, nails since ED started: no Chewing/swallowing difficulties: swallowing difficulties Reflux or heartburn: yes Trouble with teeth: yes, having dental challenges LMP without the use of hormones: none  Weight at that point: N/A Effect of exercise on menses: N/A   Effect of hormones on menses: N/A Constipation, diarrhea: both, 1-2x/day Dizziness/lightheadedness: no Headaches/body aches: yes, both Heart racing/chest pain: often Mood: good energy  Sleep: 6-7 hrs/night; wakes up to go to restroom Focus/concentration: yes Cold intolerance: yes Vision changes: yes, needs new glasses  Mental health diagnosis:    Dietary assessment: A typical day consists of 2 meals and 0  snacks  Safe foods include:  Avoided foods include:  24 hour recall:  B: typically skips S L (2 pm): [employer]-1/2 [place] sandwich (meat, lettuce) S D (6 pm): [employer]-1/2 [place] sandwich (meat, lettuce) + 3 slices of bologna + mustard + 2 slices bread S  Beverages: mocha, Celsius, water (3*16 oz; 48 oz)   What Methods Do You Use To Control Your Weight (Compensatory behaviors)?           Restricting (calories, fat, carbs)  SIV  Diet pills  Laxatives  Diuretics  Alcohol or drugs  Exercise (what type)  Food rules or rituals (explain)  Binge  Estimated energy intake: [phone] kcal  Estimated energy needs: 1600-1800 kcal 180-200 g CHO 120-135 g pro 44-50 g fat  Nutrition Diagnosis: NB-1.5 Disordered eating pattern As related to skipping meals.  As evidenced by dietary recall.  Intervention/Goals: Pt was educated and counseled on eating to nourish the body, signs/symptoms of not being adequately nourished, ways to increase nourishment, and meal planning. Discussed potentially feeling bloated, gastroparesis, abdominal distention, and feelings of fullness when increasing intake. Pt agreed with goals listed. Goals: - Aim to have 3 meals a day.   Meal plan:    3 meals    0-3 snacks  Monitoring and Evaluation: Patient will follow up in 6 weeks.

## 2024-01-23 NOTE — Telephone Encounter (Signed)
 Will schedule once we get providers May schedule

## 2024-01-23 NOTE — Telephone Encounter (Signed)
 Ok to schedule. Hold eliquis 48 hours. Make sure orders had for jardiance to be held 72 hours and iron 7 days. Should have orders for management of insulin and metformin during prep.

## 2024-01-24 ENCOUNTER — Encounter: Payer: Self-pay | Admitting: Family Medicine

## 2024-01-24 ENCOUNTER — Ambulatory Visit (INDEPENDENT_AMBULATORY_CARE_PROVIDER_SITE_OTHER): Payer: 59 | Admitting: Family Medicine

## 2024-01-24 VITALS — BP 150/68 | HR 84 | Temp 98.5°F | Resp 20 | Wt 186.0 lb

## 2024-01-24 DIAGNOSIS — J4550 Severe persistent asthma, uncomplicated: Secondary | ICD-10-CM | POA: Diagnosis not present

## 2024-01-24 DIAGNOSIS — K219 Gastro-esophageal reflux disease without esophagitis: Secondary | ICD-10-CM | POA: Diagnosis not present

## 2024-01-24 DIAGNOSIS — J310 Chronic rhinitis: Secondary | ICD-10-CM

## 2024-01-24 DIAGNOSIS — D839 Common variable immunodeficiency, unspecified: Secondary | ICD-10-CM | POA: Diagnosis not present

## 2024-01-24 MED ORDER — BUDESONIDE 0.5 MG/2ML IN SUSP: RESPIRATORY_TRACT | 0 refills | Status: DC

## 2024-01-25 ENCOUNTER — Other Ambulatory Visit: Payer: Self-pay | Admitting: *Deleted

## 2024-01-25 ENCOUNTER — Encounter: Payer: Self-pay | Admitting: *Deleted

## 2024-01-25 MED ORDER — PEG 3350-KCL-NA BICARB-NACL 420 G PO SOLR: 4000.0000 mL | Freq: Once | ORAL | 0 refills | Status: AC

## 2024-01-25 NOTE — Telephone Encounter (Signed)
 Pt has been scheduled for 02/26/24. Instructions mailed and prep sent to the pharmacy.

## 2024-01-29 ENCOUNTER — Encounter: Payer: Self-pay | Admitting: Physician Assistant

## 2024-01-29 ENCOUNTER — Ambulatory Visit (INDEPENDENT_AMBULATORY_CARE_PROVIDER_SITE_OTHER): Admitting: Clinical

## 2024-01-29 ENCOUNTER — Ambulatory Visit (INDEPENDENT_AMBULATORY_CARE_PROVIDER_SITE_OTHER): Payer: 59 | Admitting: Physician Assistant

## 2024-01-29 VITALS — BP 141/57 | HR 82 | Resp 20 | Ht 64.0 in | Wt 184.0 lb

## 2024-01-29 DIAGNOSIS — F4310 Post-traumatic stress disorder, unspecified: Secondary | ICD-10-CM | POA: Diagnosis not present

## 2024-01-29 DIAGNOSIS — R251 Tremor, unspecified: Secondary | ICD-10-CM | POA: Diagnosis not present

## 2024-01-29 MED ORDER — TOPIRAMATE 100 MG PO TABS: ORAL_TABLET | ORAL | 3 refills | Status: AC
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
## 2024-01-29 NOTE — Progress Notes (Signed)
 Assessment/Plan:   Paresthesias of the hands  Negative NCS/EMG.  Labs were unremarkable (TSH, B12, B1, sed rate, CRP, copper , SPEP with immunofixation, folate, ANA).  No neuropathy was noted May need to revisit with Neurosurgery (history of C spine stenosis)  Tremor Likely medication induced.  She is on both, psychiatric tremor inducing medications (Depakote ), as well as asthma medications.  In addition, she has anxiety which may contribute to the signs and symptoms.  She reports that  tremors are worse during the day. No other parkinsonian signs. Continue topiramate , increase to 200 mg in the morning, and continue 100 mg in the evening as this is therapeutic.   Recommend using CPAP for OSA Follow-up in 1 year     Cervical spinal stenosis             -following with Dr. Michale Age, not a candidate for surgery as per MD                   4. Follow up with her PCP on her other medical issues    Subjective:    Numbness and tingling at the tip of her hands has not resolved despite normal NCS-EMG.  The numbness is without radiation.  She is on gabapentin  600 mg 3 times daily for diabetes which can be utilized for paresthesia.  She has seen Dr. Cabbell for "chronic pinched nerve in the neck ", but not a surgical candidate. She is to follow up with NS, possible PT, other treatment options.  As for tremors, these are well-controlled with topiramate  100 mg nightly, but slightly worse in the morning currently at 150 mg a.m, without other parkinsonian signs. She denies dropping objects or excessive drooling.  Denies portions of vision or auditory hallucinations.  Denies diplopia.  No recent head trauma.  No urinary or bowel incontinence.  No difficulty swallowing, had recent esophageal dilatation.  No severe muscle cramps.  Mood is stable.  Able to perform her ADLs.  Continues to drive.     ALLERGIES:   Allergies  Allergen Reactions   Ativan [Lorazepam] Hives   Phenergan [Promethazine  Hcl] Hives    CURRENT MEDICATIONS:  Outpatient Encounter Medications as of 01/29/2024  Medication Sig   apixaban  (ELIQUIS ) 5 MG TABS tablet TAKE 1 TABLET BY MOUTH TWICE  DAILY   atorvastatin  (LIPITOR) 20 MG tablet Take 20 mg by mouth at bedtime.   Azelastine  HCl 137 MCG/SPRAY SOLN Place 1 spray into both nostrils daily.   BD PEN NEEDLE NANO 2ND GEN 32G X 4 MM MISC 1 each by Other route in the morning, at noon, in the evening, and at bedtime.   bisoprolol  (ZEBETA ) 10 MG tablet Take 1 tablet (10 mg total) by mouth daily.   blood glucose meter kit and supplies 1 each by Other route 4 (four) times daily. Dispense based on patient and insurance preference. Use up to four times daily as directed. (FOR ICD-10 E10.9, E11.9).   budesonide  (PULMICORT ) 0.5 MG/2ML nebulizer solution Take 0.5 mg via nebulizer twice a day for the next 1-2 weeks or until cough and wheeze free, then stop   Cholecalciferol (VITAMIN D3) 125 MCG (5000 UT) CAPS Take 1 capsule (5,000 Units total) by mouth daily.   Continuous Glucose Sensor (FREESTYLE LIBRE 3 SENSOR) MISC 1 Piece by Does not apply route every 14 (fourteen) days. Place 1 sensor on the skin every 14 days. Use to check glucose continuously   diltiazem  (CARDIZEM  CD) 300 MG 24 hr capsule  Take 1 capsule (300 mg total) by mouth daily.   [START ON 02/10/2024] divalproex  (DEPAKOTE  ER) 500 MG 24 hr tablet Take 1 tablet (500 mg total) by mouth daily AND 2 tablets (1,000 mg total) at bedtime.   ELDERBERRY PO Take 4 g by mouth daily. Gummies 2g each   empagliflozin  (JARDIANCE ) 10 MG TABS tablet TAKE 1 TABLET BY MOUTH DAILY  BEFORE BREAKFAST   EPINEPHrine  0.3 mg/0.3 mL IJ SOAJ injection Inject 0.3 mg into the muscle once as needed for anaphylaxis.   FASENRA  PEN 30 MG/ML prefilled autoinjector INJECT 1 PEN SUBCUTANEOUSLY  EVERY 8 WEEKS   FERROUS SULFATE  PO Take 65 mg by mouth every evening.   fluticasone  furoate-vilanterol (BREO ELLIPTA ) 100-25 MCG/ACT AEPB Inhale 1 puff into the  lungs daily.   gabapentin  (NEURONTIN ) 600 MG tablet Take 600 mg by mouth 3 (three) times daily.   hydrOXYzine  (ATARAX ) 25 MG tablet TAKE ONE TABLET BY MOUTH THREE TIMES DAILY AS NEEDED FOR ANXIETY OR FOR ITCHING   Immune Globulin , Human, (CUVITRU  [place]) Inject 27 mg into the skin every 14 (fourteen) days.   insulin  glargine (LANTUS  SOLOSTAR) 100 UNIT/ML Solostar Pen Inject 30 Units into the skin at bedtime.   Insulin  Pen Needle (PEN NEEDLES 3/16") 31G X 5 MM MISC Use as directed with insulin  pen   lansoprazole  (PREVACID ) 30 MG capsule Take 1 capsule (30 mg total) by mouth 2 (two) times daily before a meal.   levalbuterol  (XOPENEX  HFA) 45 MCG/ACT inhaler USE 2 INHALATIONS BY MOUTH EVERY 6 HOURS AS NEEDED FOR WHEEZING   levalbuterol  (XOPENEX ) 1.25 MG/3ML nebulizer solution Use 1 vial in the nebulizer every 6 hours as needed for cough, wheeze, shortness of breath or chest tightness.   levothyroxine  (SYNTHROID , LEVOTHROID) 112 MCG tablet Take 112 mcg by mouth daily before breakfast.   linaclotide  (LINZESS ) 290 MCG CAPS capsule Take 1 capsule (290 mcg total) by mouth daily before breakfast.   losartan  (COZAAR ) 50 MG tablet Take 50 mg by mouth every morning.   MELATONIN GUMMIES PO Take 10 mg by mouth at bedtime.   metFORMIN  (GLUCOPHAGE ) 500 MG tablet Take 500 mg by mouth 2 (two) times daily.   montelukast  (SINGULAIR ) 10 MG tablet TAKE 1 TABLET BY MOUTH AT  BEDTIME   Multiple Vitamins-Minerals (HAIR SKIN & NAILS PO) Take 1 tablet by mouth daily.   Multiple Vitamins-Minerals (MULTIVITAMIN WITH MINERALS) tablet Take 1 tablet by mouth daily. Woman   Nebulizer MISC 1 Device by Other route as directed. Nebulizer tubing kit   ondansetron  (ZOFRAN -ODT) 8 MG disintegrating tablet Take 8 mg by mouth daily as needed for vomiting or nausea.   ONETOUCH ULTRA test strip 1 each by Other route in the morning, at noon, in the evening, and at bedtime.   polyethylene glycol (MIRALAX  / GLYCOLAX ) 17 g packet Take 17 g by mouth  daily as needed. (Patient taking differently: Take 17 g by mouth daily as needed for moderate constipation or severe constipation.)   Potassium Chloride  ER 20 MEQ TBCR Take 20 mEq by mouth daily.   sertraline  (ZOLOFT ) 50 MG tablet Take 1 tablet (50 mg total) by mouth at bedtime.   Spacer/Aero-Holding Chambers DEVI 1 Device by Does not apply route as directed.   torsemide  (DEMADEX ) 20 MG tablet TAKE 1 TABLET BY MOUTH DAILY FOR FLUID GAIN OF 3 LBS OVERNIGHT OR 5 LBS IN A WEEK   TRULICITY  4.5 MG/0.5ML SOAJ INJECT THE CONTENTS OF ONE PEN  SUBCUTANEOUSLY WEEKLY AS  DIRECTED   [  DISCONTINUED] topiramate  (TOPAMAX ) 100 MG tablet TAKE 1 AND 1/2 TABLETS BY MOUTH  IN THE MORNING AND 1 TABLET IN  THE EVENING   topiramate  (TOPAMAX ) 100 MG tablet TAKE 2  TABLETS BY MOUTH  IN THE MORNING AND 1 TABLET IN  THE EVENING   No facility-administered encounter medications on file as of 01/29/2024.    Objective:   PHYSICAL EXAMINATION:    VITALS:   Vitals:   01/29/24 1119  BP: (!) 141/57  Pulse: 82  Resp: 20  SpO2: 96%  Weight: 184 lb (83.5 kg)  Height: 5\' 4"  (1.626 m)       GEN:  The patient appears stated age and is in NAD. HEENT:  Normocephalic, atraumatic.  The mucous membranes are moist. The superficial temporal arteries are without ropiness or tenderness. CV:  RRR Lungs:  CTAB Neck/HEME:  There are no carotid bruits bilaterally.  Neurological examination:  Orientation: The patient is alert and oriented x3. Cranial nerves: There is good facial symmetry with no facial hypomimia. The speech is fluent and clear. Soft palate rises symmetrically and there is no tongue deviation. Hearing is intact to conversational tone. Sensation: Sensation is intact bilateral to light touch throughout, temperature and vibration normal, DTR 1/4 at the feet, and 2 out of 4 on the hands. Motor: Strength is at least antigravity x4.  Movement examination: Tone: There is normal tone in the extremities Abnormal  movements:  Mild Postural tremor on the R, no tremor at rest.   Coordination:  There is no decremation with RAM's, good FNF.   Gait and Station: The patient has no difficulty arising out of a deep-seated chair without the use of the hands. The patient's stride length is normal.        Total time spent on today's visit was20minutes, including both face-to-face time and nonface-to-face time.  Time included that spent on review of records (prior notes available to me/labs/imaging if pertinent), discussing treatment and goals, answering patient's questions and coordinating care.

## 2024-01-29 NOTE — Patient Instructions (Signed)
 Continue topamax  as directed 200 mg in am, 100 mg at night Follow up in 1 year

## 2024-02-01 ENCOUNTER — Other Ambulatory Visit: Payer: Self-pay | Admitting: Cardiology

## 2024-02-01 ENCOUNTER — Inpatient Hospital Stay: Admitting: Oncology

## 2024-02-01 VITALS — BP 156/74 | HR 84 | Temp 98.0°F | Resp 20 | Wt 183.2 lb

## 2024-02-01 DIAGNOSIS — D802 Selective deficiency of immunoglobulin A [IgA]: Secondary | ICD-10-CM | POA: Diagnosis not present

## 2024-02-01 DIAGNOSIS — D696 Thrombocytopenia, unspecified: Secondary | ICD-10-CM

## 2024-02-01 DIAGNOSIS — R198 Other specified symptoms and signs involving the digestive system and abdomen: Secondary | ICD-10-CM

## 2024-02-01 DIAGNOSIS — D839 Common variable immunodeficiency, unspecified: Secondary | ICD-10-CM | POA: Diagnosis not present

## 2024-02-01 DIAGNOSIS — R112 Nausea with vomiting, unspecified: Secondary | ICD-10-CM | POA: Diagnosis not present

## 2024-02-01 DIAGNOSIS — D72819 Decreased white blood cell count, unspecified: Secondary | ICD-10-CM

## 2024-02-01 DIAGNOSIS — Z800 Family history of malignant neoplasm of digestive organs: Secondary | ICD-10-CM | POA: Diagnosis not present

## 2024-02-01 DIAGNOSIS — R634 Abnormal weight loss: Secondary | ICD-10-CM | POA: Diagnosis not present

## 2024-02-01 DIAGNOSIS — F17200 Nicotine dependence, unspecified, uncomplicated: Secondary | ICD-10-CM | POA: Diagnosis not present

## 2024-02-01 NOTE — Progress Notes (Signed)
 [facility] at [facility]  HEMATOLOGY FOLLOW-UP VISIT  [name], MD  REASON FOR FOLLOW-UP: Thrombocytopenia  ASSESSMENT & PLAN:  Patient is a 63 y.o. female following for thrombocytopenia  Thrombocytopenia (HCC) Patient has some chronic occasional low platelet counts of 140s. No episodes of abnormal bleeding Normal iron, B12 and folate levels Repeat CBC showed normal platelet count Hepatitis panel: Negative Copper : Normal  No further hematological needs at this time.  Will discharge patient at this time to follow-up with primary care.  Recommended patient to reach out to us  if she has any future concerns.  Leukopenia Patient has occasional low white blood cell count.  Last labs from 01/11/2024 showed white count of 3.6 No B symptoms Likely secondary to recent infection Repeat CBC showed normal white blood cell count.  LDH, uric acid: Normal  No further workup at this time  Gastrointestinal symptoms Persistent nausea, anorexia, and weight loss.  Under GI specialist and nutritionist care.  Concern due to family history of colon cancer.  - Continue follow-up with GI specialist. - Continue working with nutritionist for dietary management.  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  - Continue to follow with the allergist and receive IVIG as prescribed   The total time spent in the appointment was 20 minutes encounter with patients including review of chart and various tests results, discussions about plan of care and coordination of care plan   All questions were answered. The patient knows to call the clinic with any problems, questions or concerns. No barriers to learning was detected.  Eduardo Grade, MD 4/24/20253:45 PM    INTERVAL HISTORY: Laura [name] 63 y.o. female following for thrombocytopenia. She was evaluated for low platelet and white blood cell counts. Recent blood  work showed normalization of both platelet and white blood cell counts. Copper  levels were also assessed and found to be normal.  She is experiencing ongoing weight loss, now down to 181 pounds, accompanied by significant nausea and vomiting. She has difficulty eating due to nausea with every meal, contributing to her weight loss. She is concerned about the fluctuations in her blood work over the past year, despite current normal lab results.  She continues to receive IVIG infusions, which are managed by another physician. She has consulted a nutritionist but found the visit unproductive, as she did not receive clear guidance or a meal plan.  She denies fevers, chills, easy bruising/bleeding.  I have reviewed the past medical history, past surgical history, social history and family history with the patient   ALLERGIES:  is allergic to ativan [lorazepam] and phenergan [promethazine hcl].  MEDICATIONS:  Current Outpatient Medications  Medication Sig Dispense Refill   apixaban  (ELIQUIS ) 5 MG TABS tablet TAKE 1 TABLET BY MOUTH TWICE  DAILY 200 tablet 1   atorvastatin  (LIPITOR) 20 MG tablet Take 20 mg by mouth at bedtime.     Azelastine  HCl 137 MCG/SPRAY SOLN Place 1 spray into both nostrils daily. 90 mL 1   BD PEN NEEDLE NANO 2ND GEN 32G X 4 MM MISC 1 each by Other route in the morning, at noon, in the evening, and at bedtime. 90 each 0   bisoprolol  (ZEBETA ) 10 MG tablet Take 1 tablet (10 mg total) by mouth daily. 90 tablet 3   blood glucose meter kit and supplies 1 each by Other route 4 (four) times daily. Dispense based on patient and insurance preference. Use up to  four times daily as directed. (FOR ICD-10 E10.9, E11.9). 1 each 0   budesonide  (PULMICORT ) 0.5 MG/2ML nebulizer solution Take 0.5 mg via nebulizer twice a day for the next 1-2 weeks or until cough and wheeze free, then stop 100 mL 0   Cholecalciferol (VITAMIN D3) 125 MCG (5000 UT) CAPS Take 1 capsule (5,000 Units total) by mouth  daily. 90 capsule 0   Continuous Glucose Sensor (FREESTYLE LIBRE 3 SENSOR) MISC 1 Piece by Does not apply route every 14 (fourteen) days. Place 1 sensor on the skin every 14 days. Use to check glucose continuously 6 each 1   diltiazem  (CARDIZEM  CD) 300 MG 24 hr capsule Take 1 capsule (300 mg total) by mouth daily. 90 capsule 3   [START ON 02/10/2024] divalproex  (DEPAKOTE  ER) 500 MG 24 hr tablet Take 1 tablet (500 mg total) by mouth daily AND 2 tablets (1,000 mg total) at bedtime. 270 tablet 0   ELDERBERRY PO Take 4 g by mouth daily. Gummies 2g each     empagliflozin  (JARDIANCE ) 10 MG TABS tablet TAKE 1 TABLET BY MOUTH DAILY  BEFORE BREAKFAST 100 tablet 0   EPINEPHrine  0.3 mg/0.3 mL IJ SOAJ injection Inject 0.3 mg into the muscle once as needed for anaphylaxis.     FASENRA  PEN 30 MG/ML prefilled autoinjector INJECT 1 PEN SUBCUTANEOUSLY  EVERY 8 WEEKS 1 mL 8   FERROUS SULFATE  PO Take 65 mg by mouth every evening.     fluticasone  furoate-vilanterol (BREO ELLIPTA ) 100-25 MCG/ACT AEPB Inhale 1 puff into the lungs daily. 60 each 5   gabapentin  (NEURONTIN ) 600 MG tablet Take 600 mg by mouth 3 (three) times daily.     hydrOXYzine  (ATARAX ) 25 MG tablet TAKE ONE TABLET BY MOUTH THREE TIMES DAILY AS NEEDED FOR ANXIETY OR FOR ITCHING 30 tablet 1   Immune Globulin , Human, (CUVITRU  [place]) Inject 27 mg into the skin every 14 (fourteen) days.     insulin  glargine (LANTUS  SOLOSTAR) 100 UNIT/ML Solostar Pen Inject 30 Units into the skin at bedtime. 30 mL 1   Insulin  Pen Needle (PEN NEEDLES 3/16") 31G X 5 MM MISC Use as directed with insulin  pen 100 each 0   lansoprazole  (PREVACID ) 30 MG capsule Take 1 capsule (30 mg total) by mouth 2 (two) times daily before a meal. 180 capsule 3   levalbuterol  (XOPENEX  HFA) 45 MCG/ACT inhaler USE 2 INHALATIONS BY MOUTH EVERY 6 HOURS AS NEEDED FOR WHEEZING 30 g 6   levalbuterol  (XOPENEX ) 1.25 MG/3ML nebulizer solution Use 1 vial in the nebulizer every 6 hours as needed for cough, wheeze,  shortness of breath or chest tightness. 225 mL 1   levothyroxine  (SYNTHROID , LEVOTHROID) 112 MCG tablet Take 112 mcg by mouth daily before breakfast.     linaclotide  (LINZESS ) 290 MCG CAPS capsule Take 1 capsule (290 mcg total) by mouth daily before breakfast. 90 capsule 3   losartan  (COZAAR ) 50 MG tablet Take 50 mg by mouth every morning.     MELATONIN GUMMIES PO Take 10 mg by mouth at bedtime.     metFORMIN  (GLUCOPHAGE ) 500 MG tablet Take 500 mg by mouth 2 (two) times daily.     montelukast  (SINGULAIR ) 10 MG tablet TAKE 1 TABLET BY MOUTH AT  BEDTIME 100 tablet 1   Multiple Vitamins-Minerals (HAIR SKIN & NAILS PO) Take 1 tablet by mouth daily.     Multiple Vitamins-Minerals (MULTIVITAMIN WITH MINERALS) tablet Take 1 tablet by mouth daily. Woman     Nebulizer MISC 1 Device  by Other route as directed. Nebulizer tubing kit 1 each 1   ondansetron  (ZOFRAN -ODT) 8 MG disintegrating tablet Take 8 mg by mouth daily as needed for vomiting or nausea.     ONETOUCH ULTRA test strip 1 each by Other route in the morning, at noon, in the evening, and at bedtime.     polyethylene glycol (MIRALAX  / GLYCOLAX ) 17 g packet Take 17 g by mouth daily as needed. (Patient taking differently: Take 17 g by mouth daily as needed for moderate constipation or severe constipation.) 14 each 0   Potassium Chloride  ER 20 MEQ TBCR Take 20 mEq by mouth daily.     sertraline  (ZOLOFT ) 50 MG tablet Take 1 tablet (50 mg total) by mouth at bedtime. 90 tablet 0   Spacer/Aero-Holding Chambers DEVI 1 Device by Does not apply route as directed. 1 each 1   topiramate  (TOPAMAX ) 100 MG tablet TAKE 2  TABLETS BY MOUTH  IN THE MORNING AND 1 TABLET IN  THE EVENING 450 tablet 3   torsemide  (DEMADEX ) 20 MG tablet TAKE 1 TABLET BY MOUTH DAILY FOR FLUID GAIN OF 3 LBS OVERNIGHT OR 5 LBS IN A WEEK 30 tablet 5   TRULICITY  4.5 MG/0.5ML SOAJ INJECT THE CONTENTS OF ONE PEN  SUBCUTANEOUSLY WEEKLY AS  DIRECTED 6 mL 3   No current facility-administered  medications for this visit.     REVIEW OF SYSTEMS:   Constitutional: Denies fevers, chills or night sweats Eyes: Denies blurriness of vision Ears, nose, mouth, throat, and face: Denies mucositis or sore throat Respiratory: Denies cough, dyspnea or wheezes Cardiovascular: Denies palpitation, chest discomfort or lower extremity swelling Gastrointestinal:  Denies nausea, heartburn or change in bowel habits Skin: Denies abnormal skin rashes Lymphatics: Denies new lymphadenopathy or easy bruising Neurological:Denies numbness, tingling or new weaknesses Behavioral/Psych: Mood is stable, no new changes  All other systems were reviewed with the patient and are negative.  PHYSICAL EXAMINATION:   Vitals:   02/01/24 1442  BP: (!) 156/74  Pulse: 84  Resp: 20  Temp: 98 F (36.7 C)  SpO2: 96%    GENERAL:alert, no distress and comfortable SKIN: skin color, texture, turgor are normal, no rashes or significant lesions LUNGS: clear to auscultation and percussion with normal breathing effort HEART: regular rate & rhythm and no murmurs and no lower extremity edema ABDOMEN:abdomen soft, non-tender and normal bowel sounds Musculoskeletal:no cyanosis of digits and no clubbing  NEURO: alert & oriented x 3 with fluent speech  LABORATORY DATA:  I have reviewed the data as listed  Lab Results  Component Value Date   WBC 5.5 01/18/2024   NEUTROABS 3.2 01/18/2024   HGB 15.2 (H) 01/18/2024   HCT 44.7 01/18/2024   MCV 97.8 01/18/2024   PLT 181 01/18/2024      Chemistry      Component Value Date/Time   NA 139 01/18/2024 0941   NA 141 03/03/2023 1121   K 4.3 01/18/2024 0941   CL 110 01/18/2024 0941   CO2 19 (L) 01/18/2024 0941   BUN 14 01/18/2024 0941   BUN 15 03/03/2023 1121   CREATININE 0.64 01/18/2024 0941   CREATININE 0.51 03/13/2018 1034   GLU 150 12/01/2022 0000      Component Value Date/Time   CALCIUM  9.7 01/18/2024 0941   ALKPHOS 61 01/18/2024 0941   AST 18 01/18/2024 0941    ALT 17 01/18/2024 0941   BILITOT 0.4 01/18/2024 0941   BILITOT 0.2 03/03/2023 1121   BILITOT 0.2 03/03/2023 1121  Latest Reference Range & Units 01/18/24 09:41  Hep A Ab, IgM NON REACTIVE  NON REACTIVE  Hepatitis B Surface Ag NON REACTIVE  NON REACTIVE  Hep B Core Ab, IgM NON REACTIVE  NON REACTIVE  HCV Ab NON REACTIVE  NON REACTIVE    Latest Reference Range & Units 01/18/24 09:41  Copper  80 - 158 ug/dL 540    Latest Reference Range & Units 01/18/24 09:41  LDH 98 - 192 U/L 154    Latest Reference Range & Units 01/18/24 09:41  Uric Acid, Serum 2.5 - 7.1 mg/dL 5.7

## 2024-02-01 NOTE — Assessment & Plan Note (Signed)
 Persistent nausea, anorexia, and weight loss.  Under GI specialist and nutritionist care.  Concern due to family history of colon cancer.  - Continue follow-up with GI specialist. - Continue working with nutritionist for dietary management.

## 2024-02-01 NOTE — Patient Instructions (Signed)
 VISIT SUMMARY:  Today, you came in for a follow-up regarding your previously low platelet and white blood cell counts. Your recent blood work showed that both your platelet and white blood cell counts have returned to normal, and your copper  levels are also normal. However, you are experiencing ongoing weight loss due to significant nausea and vomiting, which makes it difficult for you to eat. You are concerned about the fluctuations in your blood work over the past year, despite the current normal results.  YOUR PLAN:  -LOW PLATELET AND WHITE BLOOD CELL COUNT: Your platelet and white blood cell counts, which were previously low, have now returned to normal. Copper  levels are also normal, indicating no hematological issues at this time. No further follow-up with hematology is needed unless new symptoms arise.  -NAUSEA AND WEIGHT LOSS: You are experiencing persistent nausea with eating, which is causing significant weight loss. This issue requires further evaluation by a gastrointestinal (GI) specialist and continued follow-up with a nutritionist to manage your diet and help you maintain a healthy weight.  INSTRUCTIONS:  Please continue with your gastrointestinal (GI) evaluation and follow up with your nutritionist for dietary management. If you experience any new symptoms or have concerns, do not hesitate to contact us .

## 2024-02-01 NOTE — Assessment & Plan Note (Signed)
 Patient has IgA deficiency and receives IVIG with the allergist in [place].  - Continue to follow with the allergist and receive IVIG as prescribed

## 2024-02-01 NOTE — Assessment & Plan Note (Signed)
 Patient has occasional low white blood cell count.  Last labs from 01/11/2024 showed white count of 3.6 No B symptoms Likely secondary to recent infection Repeat CBC showed normal white blood cell count.  LDH, uric acid: Normal  No further workup at this time

## 2024-02-01 NOTE — Assessment & Plan Note (Signed)
 Patient has some chronic occasional low platelet counts of 140s. No episodes of abnormal bleeding Normal iron, B12 and folate levels Repeat CBC showed normal platelet count Hepatitis panel: Negative Copper : Normal  No further hematological needs at this time.  Will discharge patient at this time to follow-up with primary care.  Recommended patient to reach out to us  if she has any future concerns.

## 2024-02-06 ENCOUNTER — Encounter: Payer: Self-pay | Admitting: Cardiology

## 2024-02-06 ENCOUNTER — Ambulatory Visit: Payer: 59 | Attending: Cardiology | Admitting: Cardiology

## 2024-02-06 VITALS — BP 136/58 | HR 68 | Ht 64.0 in | Wt 182.0 lb

## 2024-02-06 DIAGNOSIS — R0789 Other chest pain: Secondary | ICD-10-CM

## 2024-02-06 DIAGNOSIS — E782 Mixed hyperlipidemia: Secondary | ICD-10-CM

## 2024-02-06 DIAGNOSIS — I5032 Chronic diastolic (congestive) heart failure: Secondary | ICD-10-CM

## 2024-02-06 DIAGNOSIS — I495 Sick sinus syndrome: Secondary | ICD-10-CM

## 2024-02-06 DIAGNOSIS — I951 Orthostatic hypotension: Secondary | ICD-10-CM

## 2024-02-06 DIAGNOSIS — I10 Essential (primary) hypertension: Secondary | ICD-10-CM

## 2024-02-06 NOTE — Patient Instructions (Signed)
 Medication Instructions:  Your physician has recommended you make the following change in your medication:   -Stop Torsemide    *If you need a refill on your cardiac medications before your next appointment, please call your pharmacy*  Lab Work: None If you have labs (blood work) drawn today and your tests are completely normal, you will receive your results only by: MyChart Message (if you have MyChart) OR A paper copy in the mail If you have any lab test that is abnormal or we need to change your treatment, we will call you to review the results.  Testing/Procedures: None  Follow-Up: At [facility], you and your health needs are our priority.  As part of our continuing mission to provide you with exceptional heart care, our providers are all part of one team.  This team includes your primary Cardiologist (physician) and Advanced Practice Providers or APPs (Physician Assistants and Nurse Practitioners) who all work together to provide you with the care you need, when you need it.  Your next appointment:   6 month(s)  Provider:   You may see Armida Lander, MD or one of the following Advanced Practice Providers on your designated Care Team:   Woodfin Hays, PA-C  Sc[place]ia [place], [place] Theotis Flake, [place]     We recommend signing up for the patient portal called "MyChart".  Sign up information is provided on this After Visit Summary.  MyChart is used to connect with patients for Virtual Visits (Telemedicine).  Patients are able to view lab/test results, encounter notes, upcoming appointments, etc.  Non-urgent messages can be sent to your provider as well.   To learn more about what you can do with MyChart, go to [email or web address].   Other Instructions

## 2024-02-06 NOTE — Progress Notes (Signed)
 Clinical Summary Laura [name] is a 63 y.o.female seen today for follow up of the following medical problems.      1.Chest pain  08/2019 cath LV end diastolic pressure is mildly elevated. There is no aortic valve stenosis. Aortic atherosclerosis/calcification noted. No angiographically apparently CAD.       - ER visit Dec 2024 with atypical chest pain, thought to be GI related. Was to f/u with GI.  -11/2023 EGD moderate inflamation in stomach. Had dilatation for symptoms of dysphagia.   - no recent chest pains.       2.Orthostatic syncope - prior issues with orthostatic syncope - at prior office visit so severely orthostatic could not get up from exam table, EMS called - have cut back her diuretic, bp meds   Depakote  12-25% dizziness, 1-5% orthostatic hypotension.  Gabapentin  dizziness 17-28%  Imipramine: orthostatic hypotension Topamax : dizziness 14% Reglan : dizziness Jan 2023 HgbA1c 7.8, previously 8   - no change depakote  at least 1 year, gabapentin  was increased about 6 months, no changes in topamax ,    - very mild symptoms at times -aggresively works to stay well hydrated    2. Persistent afib/Tachy brady - admission with cardiogenic syncope, bradycardia - weaned av nodal agents. Issues with tachy and brady/pauses - had pacemaker placed 03/03/22 - given palpitations we restarted her bisoprolol  at 2.5mg  on 03/24/22    - some palpitations at times, often with being very active - 11/2023 normal device function      3. HTN - she is compliant with meds       4 LE edema/Chronic diastolic HF - 08/2019 echo LVEF 60-65%, elevated LVEDP, mild RV dysfunction, mild to mod LAE,  11/2020 echo LVEF 65-70%, indet diastolic, low normal RV function - 02/2022 echo: LVEF 60-65%, no WMAs, indet diastolic, normal RV function - we changed torsemide  to just prn given prior orthostatic symptoms, has not needed in some time.     - no recent edema, has not needed prn toresmide.       5. Prior ASD repair   6. Asthma/Bronchitis - followed by Dr Waymond Hailey     7. Weight loss - working diet, exercise. Down 20 lbs since 01/2023   8. HLD - 07/2023 TC124 TG 120 HDL 49 LDL 102 11/2023 TC 147 TG 147 HDL 42 LDL 76    9. Mitral stenosis - mild MS by 2023 echo, mean grad 4 mmHg.   10. Thrombocytopenia/Leukopenia - followed by heme/onc   11. GERD - followed by GI Past Medical History:  Diagnosis Date   Anemia    Aortic atherosclerosis (HCC)    Asthma    Atrial fibrillation and flutter (HCC)    a.) CHA2DS2VASc = 6 (sex, HTN, TIA x2, vascular disease history, T2DM);  b.) DCCV ~ 2009; c.) DCCV 08/29/2018 (200J x1 --> SR with 1st degree AVB); d.) rate/rhythm maintained on oral diltiazem  + bisoprolol ; chronically anticoagulated with apixaban    Bipolar disorder (HCC)    Carpal tunnel syndrome of right wrist    CHB (complete heart block) (HCC)    a.) s/p St. Jude dual chamber PPM placement   CHF (congestive heart failure) (HCC)    Complication of anesthesia    a.) "allergic reaction" to perioperative analgesics + anesthetic medications in [place] ; unsure of combination; not able to recount reactions/events --> states "I ended up in the ICU for 5 days"   Constipation    a.) on linaclotide    COPD (chronic obstructive pulmonary disease) (HCC)  DDD (degenerative disc disease), thoracic    GERD (gastroesophageal reflux disease)    H/O congenital atrial septal defect (ASD) repair    Hallux valgus of right foot    Hammertoe of second toe of right foot    History of 2019 novel coronavirus disease (COVID-19)    a.) 10/2020; b.) 05/2021   HLD (hyperlipidemia)    HTN (hypertension)    Hypogammaglobulinemia (HCC)    a.) on Curitru   Hypothyroid    IgE deficiency (HCC)    Long term current use of anticoagulant    a.) apixaban    Lumbar stenosis    Migraines    Mitral stenosis 02/08/2022   a.) TTE 02/08/2022: EF 60-65%, mild MAC, mild MV stenosis (peak grad 14.6 / mean  grad 4.0)   Neuropathy    NSVT (nonsustained ventricular tachycardia) (HCC) 10/31/2018   a.) Holter 10/31/2018: 6 beat run   Obesity    OSA on CPAP    Osteoarthritis    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 Jude dual chamber device; placed for symptomatic bradycardia secondary to intermittent CHB   PTSD (post-traumatic stress disorder)    Pulmonary nodules    Recurrent sinusitis    Short-term memory loss    Sleep difficulties    a.) takes melatonin   Symptomatic bradycardia    a.) s/p St. Jude dual chamber PPM placement   Syncope    TIA (transient ischemic attack)    x3-last one in 2015   Tremors of nervous system    Type 2 diabetes mellitus (HCC)      Allergies  Allergen Reactions   Ativan [Lorazepam] Hives   Phenergan [Promethazine Hcl] Hives     Current Outpatient Medications  Medication Sig Dispense Refill   apixaban  (ELIQUIS ) 5 MG TABS tablet TAKE 1 TABLET BY MOUTH TWICE  DAILY 200 tablet 1   atorvastatin  (LIPITOR) 20 MG tablet Take 20 mg by mouth at bedtime.     Azelastine  HCl 137 MCG/SPRAY SOLN Place 1 spray into both nostrils daily. 90 mL 1   BD PEN NEEDLE NANO 2ND GEN 32G X 4 MM MISC 1 each by Other route in the morning, at noon, in the evening, and at bedtime. 90 each 0   bisoprolol  (ZEBETA ) 10 MG tablet Take 1 tablet (10 mg total) by mouth daily. 90 tablet 3   blood glucose meter kit and supplies 1 each by Other route 4 (four) times daily. Dispense based on patient and insurance preference. Use up to four times daily as directed. (FOR ICD-10 E10.9, E11.9). 1 each 0   budesonide  (PULMICORT ) 0.5 MG/2ML nebulizer solution Take 0.5 mg via nebulizer twice a day for the next 1-2 weeks or until cough and wheeze free, then stop 100 mL 0   Cholecalciferol (VITAMIN D3) 125 MCG (5000 UT) CAPS Take 1 capsule (5,000 Units total) by mouth daily. 90 capsule 0   Continuous Glucose Sensor (FREESTYLE LIBRE 3 SENSOR) MISC 1 Piece by Does not apply route every  14 (fourteen) days. Place 1 sensor on the skin every 14 days. Use to check glucose continuously 6 each 1   diltiazem  (CARDIZEM  CD) 300 MG 24 hr capsule Take 1 capsule (300 mg total) by mouth daily. 90 capsule 3   [START ON 02/10/2024] divalproex  (DEPAKOTE  ER) 500 MG 24 hr tablet Take 1 tablet (500 mg total) by mouth daily AND 2 tablets (1,000 mg total) at bedtime. 270 tablet 0   ELDERBERRY PO Take 4 g  by mouth daily. Gummies 2g each     empagliflozin  (JARDIANCE ) 10 MG TABS tablet TAKE 1 TABLET BY MOUTH DAILY  BEFORE BREAKFAST 100 tablet 0   EPINEPHrine  0.3 mg/0.3 mL IJ SOAJ injection Inject 0.3 mg into the muscle once as needed for anaphylaxis.     FASENRA  PEN 30 MG/ML prefilled autoinjector INJECT 1 PEN SUBCUTANEOUSLY  EVERY 8 WEEKS 1 mL 8   FERROUS SULFATE  PO Take 65 mg by mouth every evening.     fluticasone  furoate-vilanterol (BREO ELLIPTA ) 100-25 MCG/ACT AEPB Inhale 1 puff into the lungs daily. 60 each 5   gabapentin  (NEURONTIN ) 600 MG tablet Take 600 mg by mouth 3 (three) times daily.     hydrOXYzine  (ATARAX ) 25 MG tablet TAKE ONE TABLET BY MOUTH THREE TIMES DAILY AS NEEDED FOR ANXIETY OR FOR ITCHING 30 tablet 1   Immune Globulin , Human, (CUVITRU  [place]) Inject 27 mg into the skin every 14 (fourteen) days.     insulin  glargine (LANTUS  SOLOSTAR) 100 UNIT/ML Solostar Pen Inject 30 Units into the skin at bedtime. 30 mL 1   Insulin  Pen Needle (PEN NEEDLES 3/16") 31G X 5 MM MISC Use as directed with insulin  pen 100 each 0   lansoprazole  (PREVACID ) 30 MG capsule Take 1 capsule (30 mg total) by mouth 2 (two) times daily before a meal. 180 capsule 3   levalbuterol  (XOPENEX  HFA) 45 MCG/ACT inhaler USE 2 INHALATIONS BY MOUTH EVERY 6 HOURS AS NEEDED FOR WHEEZING 30 g 6   levalbuterol  (XOPENEX ) 1.25 MG/3ML nebulizer solution Use 1 vial in the nebulizer every 6 hours as needed for cough, wheeze, shortness of breath or chest tightness. 225 mL 1   levothyroxine  (SYNTHROID , LEVOTHROID) 112 MCG tablet Take 112 mcg  by mouth daily before breakfast.     linaclotide  (LINZESS ) 290 MCG CAPS capsule Take 1 capsule (290 mcg total) by mouth daily before breakfast. 90 capsule 3   losartan  (COZAAR ) 50 MG tablet Take 50 mg by mouth every morning.     MELATONIN GUMMIES PO Take 10 mg by mouth at bedtime.     metFORMIN  (GLUCOPHAGE ) 500 MG tablet Take 500 mg by mouth 2 (two) times daily.     montelukast  (SINGULAIR ) 10 MG tablet TAKE 1 TABLET BY MOUTH AT  BEDTIME 100 tablet 1   Multiple Vitamins-Minerals (HAIR SKIN & NAILS PO) Take 1 tablet by mouth daily.     Multiple Vitamins-Minerals (MULTIVITAMIN WITH MINERALS) tablet Take 1 tablet by mouth daily. Woman     Nebulizer MISC 1 Device by Other route as directed. Nebulizer tubing kit 1 each 1   ondansetron  (ZOFRAN -ODT) 8 MG disintegrating tablet Take 8 mg by mouth daily as needed for vomiting or nausea.     ONETOUCH ULTRA test strip 1 each by Other route in the morning, at noon, in the evening, and at bedtime.     polyethylene glycol (MIRALAX  / GLYCOLAX ) 17 g packet Take 17 g by mouth daily as needed. (Patient taking differently: Take 17 g by mouth daily as needed for moderate constipation or severe constipation.) 14 each 0   Potassium Chloride  ER 20 MEQ TBCR Take 20 mEq by mouth daily.     sertraline  (ZOLOFT ) 50 MG tablet Take 1 tablet (50 mg total) by mouth at bedtime. 90 tablet 0   Spacer/Aero-Holding Chambers DEVI 1 Device by Does not apply route as directed. 1 each 1   topiramate  (TOPAMAX ) 100 MG tablet TAKE 2  TABLETS BY MOUTH  IN THE MORNING AND 1  TABLET IN  THE EVENING 450 tablet 3   torsemide  (DEMADEX ) 20 MG tablet TAKE 1 TABLET BY MOUTH DAILY FOR FLUID GAIN OF 3 LBS OVERNIGHT OR 5 LBS IN A WEEK 100 tablet 2   TRULICITY  4.5 MG/0.5ML SOAJ INJECT THE CONTENTS OF ONE PEN  SUBCUTANEOUSLY WEEKLY AS  DIRECTED 6 mL 3   No current facility-administered medications for this visit.     Past Surgical History:  Procedure Laterality Date   ASD REPAIR  1968   BALLOON  DILATION N/A 11/27/2023   Procedure: BALLOON DILATION;  Surgeon: Vinetta Greening, DO;  Location: [facility];  Service: Endoscopy;  Laterality: N/A;  9:00 am, asa 3   BIOPSY  01/26/2021   Procedure: BIOPSY;  Surgeon: Vinetta Greening, DO;  Location: [facility];  Service: Endoscopy;;  gastric   BIOPSY  11/27/2023   Procedure: BIOPSY;  Surgeon: Vinetta Greening, DO;  Location: [facility];  Service: Endoscopy;;   CARDIOVERSION  2009   CARDIOVERSION N/A 08/29/2018   Procedure: CARDIOVERSION;  Surgeon: Laurann Pollock, MD;  Location: [facility];  Service: Endoscopy;  Laterality: N/A;   CARPAL TUNNEL RELEASE Bilateral    CESAREAN SECTION  1986, 1988, 1994   COLONOSCOPY WITH PROPOFOL  N/A 01/26/2021   Procedure: COLONOSCOPY WITH PROPOFOL ;  Surgeon: Vinetta Greening, DO;  Location: [facility];  Service: Endoscopy;  Laterality: N/A;  am appt, diabetic   ESOPHAGOGASTRODUODENOSCOPY (EGD) WITH PROPOFOL  N/A 01/26/2021   Procedure: ESOPHAGOGASTRODUODENOSCOPY (EGD) WITH PROPOFOL ;  Surgeon: Vinetta Greening, DO;  Location: [facility];  Service: Endoscopy;  Laterality: N/A;   ESOPHAGOGASTRODUODENOSCOPY (EGD) WITH PROPOFOL  N/A 11/27/2023   Procedure: ESOPHAGOGASTRODUODENOSCOPY (EGD) WITH PROPOFOL ;  Surgeon: Vinetta Greening, DO;  Location: [facility];  Service: Endoscopy;  Laterality: N/A;  9:00 am, asa 3   HALLUX VALGUS [place]USTIN Right 07/29/2022   Procedure: HALLUX VALGUS;  Surgeon: Floyce Hutching, DPM;  Location: [facility];  Service: Podiatry;  Laterality: Right;   HAMMER TOE SURGERY Right 07/29/2022   Procedure: HAMMER TOE CORRECTION 2ND;  Surgeon: Floyce Hutching, DPM;  Location: [facility];  Service: Podiatry;  Laterality: Right;   LEFT HEART CATH AND CORONARY ANGIOGRAPHY N/A 08/30/2019   Procedure: LEFT HEART CATH AND CORONARY ANGIOGRAPHY;  Surgeon: Lucendia Rusk, MD;  Location: [facility];  Service: Cardiovascular;  Laterality: N/A;   NASAL SINUS SURGERY  2017    PACEMAKER IMPLANT N/A 03/03/2022   Procedure: PACEMAKER IMPLANT;  Surgeon: Tammie Fall, MD;  Location: [facility];  Service: Cardiovascular;  Laterality: N/A;   POLYPECTOMY  01/26/2021   Procedure: POLYPECTOMY;  Surgeon: Vinetta Greening, DO;  Location: [facility];  Service: Endoscopy;;  colon   TOOTH EXTRACTION N/A 07/04/2023   Procedure: DENTAL RESTORATION/EXTRACTIONS;  Surgeon: Ascencion Lava, DMD;  Location: [facility];  Service: Oral Surgery;  Laterality: N/A;     Allergies  Allergen Reactions   Ativan [Lorazepam] Hives   Phenergan [Promethazine Hcl] Hives      Family History  Problem Relation Age of Onset   Hypertension Mother    Stroke Mother    Kidney disease Mother    Heart attack Mother    Alcohol abuse Mother    Colon cancer Father        colon cancer   Kidney disease Sister    Hypertension Sister    Bipolar disorder Sister    Atrial fibrillation Sister    Heart disease  Brother    Prostate cancer Brother    Thyroid  disease Daughter    Hashimoto's thyroiditis Daughter    Celiac disease Daughter    Allergic rhinitis Daughter    Stroke Maternal Aunt    Heart disease Maternal Aunt    Asthma Neg Hx      Social History Ms. Givans reports that she quit smoking about 17 years ago. Her smoking use included cigarettes. She started smoking about 45 years ago. She has a 28 pack-year smoking history. She has never used smokeless tobacco. Ms. Baeza reports that she does not currently use alcohol.    Physical Examination Today's Vitals   02/06/24 0957  BP: (!) 136/58  Pulse: 68  SpO2: 97%  Weight: 182 lb (82.6 kg)  Height: 5\' 4"  (1.626 m)   Body mass index is 31.24 kg/m.  Gen: resting comfortably, no acute distress HEENT: no scleral icterus, pupils equal round and reactive, no palptable cervical adenopathy,  CV: RRR, no mrg, no jvd Resp: Clear to auscultation bilaterally GI: abdomen is soft, non-tender, non-distended, normal bowel sounds, no  hepatosplenomegaly MSK: extremities are warm, no edema.  Skin: warm, no rash Neuro:  no focal deficits Psych: appropriate affect   Diagnostic Studies  08/2019 cath LV end diastolic pressure is mildly elevated. There is no aortic valve stenosis. Aortic atherosclerosis/calcification noted. No angiographically apparently CAD.     08/2019 echo 1. Left ventricular ejection fraction, by visual estimation, is 60 to  65%. The left ventricle has normal function. There is mildly increased  left ventricular hypertrophy.   2. Elevated left ventricular end-diastolic pressure.   3. Left ventricular diastolic function could not be evaluated.   4. Global right ventricle has mildly reduced systolic function.The right  ventricular size is normal. No increase in right ventricular wall  thickness.   5. Left atrial size was mild-moderately dilated.   6. Right atrial size was normal.   7. Moderate to severe mitral annular calcification.   8. The mitral valve is degenerative. Trace mitral valve regurgitation.   9. The tricuspid valve is grossly normal. Tricuspid valve regurgitation  is trivial.  10. The aortic valve is tricuspid. Aortic valve regurgitation is not  visualized. Mild aortic valve sclerosis without stenosis.  11. The pulmonic valve was grossly normal. Pulmonic valve regurgitation is  not visualized.  12. The inferior vena cava is normal in size with greater than 50%  respiratory variability, suggesting right atrial pressure of 3 mmHg.       Assessment and Plan   1. Tachy brady/afib now with pacemaker/acquired thrombophilia - overall doing well without significant symptoms, continue current meds  2. Atypical chest pain - no recent symptoms, continue to monitor   3. Chronic diastolic HF -no recent issues, has not needed prn torsemide  and asks to d/c the prescription, we will discontineu her prn toresmide.    4. Orthostatic syncope - resolved symptoms, she will continue  aggressive hydration   5. HTN - bp is essentially at goal, continue current meds   6. HLD -LDL at goal, cntinue current meds.      Laurann Pollock, M.D.

## 2024-02-09 DIAGNOSIS — W57XXXA Bitten or stung by nonvenomous insect and other nonvenomous arthropods, initial encounter: Secondary | ICD-10-CM | POA: Diagnosis not present

## 2024-02-09 DIAGNOSIS — S30860A Insect bite (nonvenomous) of lower back and pelvis, initial encounter: Secondary | ICD-10-CM | POA: Diagnosis not present
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
## 2024-02-13 ENCOUNTER — Encounter (HOSPITAL_COMMUNITY): Payer: Self-pay

## 2024-02-13 ENCOUNTER — Encounter (HOSPITAL_COMMUNITY)
Admission: RE | Admit: 2024-02-13 | Discharge: 2024-02-13 | Disposition: A | Discharge status: Home / Self Care | Source: Ambulatory Visit | Attending: Internal Medicine | Admitting: Internal Medicine

## 2024-02-13 ENCOUNTER — Other Ambulatory Visit: Payer: Self-pay

## 2024-02-13 NOTE — Pre-Procedure Instructions (Signed)
 Did pre-op phone call. Office messaged because of patient concern to being tested for Lymes and Alpha gal. She saw PCP on Friday because she found a tick that was imbedded that PCP felt had been there "a While". She is symptomatic from bite and still has a large whelp at the site. Office messaged to see if they want to proceed with TCS on 5/8.

## 2024-02-13 NOTE — Pre-Procedure Instructions (Signed)
 Attempted pre-op phonecall. Left VM for her to call us back.

## 2024-02-14 ENCOUNTER — Encounter: Payer: Self-pay | Admitting: Psychiatry

## 2024-02-14 ENCOUNTER — Telehealth (INDEPENDENT_AMBULATORY_CARE_PROVIDER_SITE_OTHER): Admitting: Psychiatry

## 2024-02-14 DIAGNOSIS — Z79899 Other long term (current) drug therapy: Secondary | ICD-10-CM

## 2024-02-14 DIAGNOSIS — F330 Major depressive disorder, recurrent, mild: Secondary | ICD-10-CM | POA: Diagnosis not present

## 2024-02-14 DIAGNOSIS — F4310 Post-traumatic stress disorder, unspecified: Secondary | ICD-10-CM

## 2024-02-14 NOTE — Patient Instructions (Signed)
 Continue Depakote  500 mg in AM, 1000 mg at night  Decrease sertraline  25 mg at night Next appointment: 6/25 at 11 am, video

## 2024-02-15 ENCOUNTER — Encounter (HOSPITAL_COMMUNITY)
Admission: RE | Disposition: A | Discharge status: Home / Self Care | Payer: Self-pay | Source: Home / Self Care | Attending: Internal Medicine

## 2024-02-15 ENCOUNTER — Ambulatory Visit (HOSPITAL_COMMUNITY)
Admission: RE | Admit: 2024-02-15 | Discharge: 2024-02-15 | Disposition: A | Discharge status: Home / Self Care | Attending: Internal Medicine | Admitting: Internal Medicine

## 2024-02-15 ENCOUNTER — Ambulatory Visit (HOSPITAL_COMMUNITY): Admitting: Anesthesiology

## 2024-02-15 ENCOUNTER — Encounter (HOSPITAL_COMMUNITY): Payer: Self-pay | Admitting: Internal Medicine

## 2024-02-15 ENCOUNTER — Other Ambulatory Visit: Payer: Self-pay

## 2024-02-15 DIAGNOSIS — K219 Gastro-esophageal reflux disease without esophagitis: Secondary | ICD-10-CM | POA: Diagnosis not present

## 2024-02-15 DIAGNOSIS — K635 Polyp of colon: Secondary | ICD-10-CM | POA: Diagnosis not present

## 2024-02-15 DIAGNOSIS — Z800 Family history of malignant neoplasm of digestive organs: Secondary | ICD-10-CM | POA: Diagnosis not present

## 2024-02-15 DIAGNOSIS — I509 Heart failure, unspecified: Secondary | ICD-10-CM | POA: Insufficient documentation

## 2024-02-15 DIAGNOSIS — I4891 Unspecified atrial fibrillation: Secondary | ICD-10-CM | POA: Diagnosis not present

## 2024-02-15 DIAGNOSIS — K529 Noninfective gastroenteritis and colitis, unspecified: Secondary | ICD-10-CM | POA: Diagnosis not present

## 2024-02-15 DIAGNOSIS — K648 Other hemorrhoids: Secondary | ICD-10-CM | POA: Diagnosis not present

## 2024-02-15 DIAGNOSIS — F419 Anxiety disorder, unspecified: Secondary | ICD-10-CM | POA: Diagnosis not present

## 2024-02-15 DIAGNOSIS — J4489 Other specified chronic obstructive pulmonary disease: Secondary | ICD-10-CM | POA: Insufficient documentation

## 2024-02-15 DIAGNOSIS — G4733 Obstructive sleep apnea (adult) (pediatric): Secondary | ICD-10-CM | POA: Diagnosis not present

## 2024-02-15 DIAGNOSIS — K5730 Diverticulosis of large intestine without perforation or abscess without bleeding: Secondary | ICD-10-CM | POA: Diagnosis not present

## 2024-02-15 DIAGNOSIS — I10 Essential (primary) hypertension: Secondary | ICD-10-CM | POA: Diagnosis not present

## 2024-02-15 DIAGNOSIS — Z7951 Long term (current) use of inhaled steroids: Secondary | ICD-10-CM | POA: Diagnosis not present

## 2024-02-15 DIAGNOSIS — I110 Hypertensive heart disease with heart failure: Secondary | ICD-10-CM | POA: Diagnosis not present

## 2024-02-15 DIAGNOSIS — Z5986 Financial insecurity: Secondary | ICD-10-CM | POA: Insufficient documentation

## 2024-02-15 DIAGNOSIS — F319 Bipolar disorder, unspecified: Secondary | ICD-10-CM | POA: Insufficient documentation

## 2024-02-15 DIAGNOSIS — K649 Unspecified hemorrhoids: Secondary | ICD-10-CM | POA: Diagnosis not present

## 2024-02-15 DIAGNOSIS — J449 Chronic obstructive pulmonary disease, unspecified: Secondary | ICD-10-CM | POA: Diagnosis not present

## 2024-02-15 DIAGNOSIS — D124 Benign neoplasm of descending colon: Secondary | ICD-10-CM | POA: Insufficient documentation

## 2024-02-15 DIAGNOSIS — Z87891 Personal history of nicotine dependence: Secondary | ICD-10-CM | POA: Insufficient documentation

## 2024-02-15 DIAGNOSIS — D839 Common variable immunodeficiency, unspecified: Secondary | ICD-10-CM | POA: Diagnosis not present

## 2024-02-15 HISTORY — PX: COLONOSCOPY: SHX5424

## 2024-02-15 LAB — GLUCOSE, CAPILLARY: Glucose-Capillary: 115 mg/dL — ABNORMAL HIGH (ref 70–99)

## 2024-02-15 SURGERY — COLONOSCOPY
Anesthesia: General

## 2024-02-15 MED ORDER — LACTATED RINGERS IV SOLN: INTRAVENOUS | Status: DC | PRN

## 2024-02-15 MED ORDER — ONDANSETRON HCL 4 MG/2ML IJ SOLN
4.0000 mg | Freq: Once | INTRAMUSCULAR | Status: AC
Start: 2024-02-15 — End: 2024-02-15
  Administered 2024-02-15: 4 mg via INTRAVENOUS

## 2024-02-15 MED ORDER — ONDANSETRON HCL 4 MG/2ML IJ SOLN
INTRAMUSCULAR | Status: DC
Start: 2024-02-15 — End: 2024-02-15
  Filled 2024-02-15: qty 2

## 2024-02-15 MED ORDER — PROPOFOL 500 MG/50ML IV EMUL
INTRAVENOUS | Status: DC | PRN
  Administered 2024-02-15: 125 ug/kg/min via INTRAVENOUS

## 2024-02-15 MED ORDER — LIDOCAINE 2% (20 MG/ML) 5 ML SYRINGE
INTRAMUSCULAR | Status: DC | PRN
Start: 2024-02-15 — End: 2024-02-15
  Administered 2024-02-15: 80 mg via INTRAVENOUS

## 2024-02-15 NOTE — Discharge Instructions (Addendum)
  Colonoscopy Discharge Instructions  Read the instructions outlined below and refer to this sheet in the next few weeks. These discharge instructions provide you with general information on caring for yourself after you leave the hospital. Your doctor may also give you specific instructions. While your treatment has been planned according to the most current medical practices available, unavoidable complications occasionally occur.   ACTIVITY You may resume your regular activity, but move at a slower pace for the next 24 hours.  Take frequent rest periods for the next 24 hours.  Walking will help get rid of the air and reduce the bloated feeling in your belly (abdomen).  No driving for 24 hours (because of the medicine (anesthesia) used during the test).   Do not sign any important legal documents or operate any machinery for 24 hours (because of the anesthesia used during the test).  NUTRITION Drink plenty of fluids.  You may resume your normal diet as instructed by your doctor.  Begin with a light meal and progress to your normal diet. Heavy or fried foods are harder to digest and may make you feel sick to your stomach (nauseated).  Avoid alcoholic beverages for 24 hours or as instructed.  MEDICATIONS You may resume your normal medications unless your doctor tells you otherwise.  WHAT YOU CAN EXPECT TODAY Some feelings of bloating in the abdomen.  Passage of more gas than usual.  Spotting of blood in your stool or on the toilet paper.  IF YOU HAD POLYPS REMOVED DURING THE COLONOSCOPY: No aspirin  products for 7 days or as instructed.  No alcohol for 7 days or as instructed.  Eat a soft diet for the next 24 hours.  FINDING OUT THE RESULTS OF YOUR TEST Not all test results are available during your visit. If your test results are not back during the visit, make an appointment with your caregiver to find out the results. Do not assume everything is normal if you have not heard from your  caregiver or the medical facility. It is important for you to follow up on all of your test results.  SEEK IMMEDIATE MEDICAL ATTENTION IF: You have more than a spotting of blood in your stool.  Your belly is swollen (abdominal distention).  You are nauseated or vomiting.  You have a temperature over 101.  You have abdominal pain or discomfort that is severe or gets worse throughout the day.   Overall, your colon appeared very healthy.  I did not see any active inflammation indicative of underlying inflammatory bowel disease such as Crohn's disease or ulcerative colitis throughout your colon or end portion of your small bowel.  I took biopsies of your colon to further evaluate.    Your colonoscopy revealed 2 polyp(s) which I removed successfully. Await pathology results, my office will contact you. I recommend repeating colonoscopy in 5 years for surveillance purposes.   You also have diverticulosis and internal hemorrhoids. I would recommend increasing fiber in your diet or adding OTC Benefiber/Metamucil. Be sure to drink at least 4 to 6 glasses of water daily.   Follow-up with GI in 8 weeks.  Office will call with appointment.   **Resume your Eliquis  tomorrow**   I hope you have a great rest of your week!  Laura [name]. Laura [name], D.O. Gastroenterology and Hepatology [facility]

## 2024-02-15 NOTE — H&P (Signed)
 Primary Care Physician:  [name], MD Primary Gastroenterologist:  Dr. Mordechai April  Pre-Procedure History & Physical: HPI:  Laura [name] is a 62 y.o. female is here for a colonoscopy to be performed for change in bowel habits, weight loss.  Past Medical History:  Diagnosis Date   Anemia    Aortic atherosclerosis (HCC)    Asthma    Atrial fibrillation and flutter (HCC)    a.) CHA2DS2VASc = 6 (sex, HTN, TIA x2, vascular disease history, T2DM);  b.) DCCV ~ 2009; c.) DCCV 08/29/2018 (200J x1 --> SR with 1st degree AVB); d.) rate/rhythm maintained on oral diltiazem  + bisoprolol ; chronically anticoagulated with apixaban    Bipolar disorder (HCC)    Carpal tunnel syndrome of right wrist    CHB (complete heart block) (HCC)    a.) s/p St. Jude dual chamber PPM placement   CHF (congestive heart failure) (HCC)    Complication of anesthesia    a.) "allergic reaction" to perioperative analgesics + anesthetic medications in [place] ; unsure of combination; not able to recount reactions/events --> states "I ended up in the ICU for 5 days"   Constipation    a.) on linaclotide    COPD (chronic obstructive pulmonary disease) (HCC)    DDD (degenerative disc disease), thoracic    GERD (gastroesophageal reflux disease)    H/O congenital atrial septal defect (ASD) repair    Hallux valgus of right foot    Hammertoe of second toe of right foot    History of 2019 novel coronavirus disease (COVID-19)    a.) 10/2020; b.) 05/2021   HLD (hyperlipidemia)    HTN (hypertension)    Hypogammaglobulinemia (HCC)    a.) on Curitru   Hypothyroid    IgE deficiency (HCC)    Long term current use of anticoagulant    a.) apixaban    Lumbar stenosis    Migraines    Mitral stenosis 02/08/2022   a.) TTE 02/08/2022: EF 60-65%, mild MAC, mild MV stenosis (peak grad 14.6 / mean grad 4.0)   Neuropathy    NSVT (nonsustained ventricular tachycardia) (HCC) 10/31/2018   a.) Holter 10/31/2018: 6 beat run   Obesity    OSA on  CPAP    Osteoarthritis    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 Jude dual chamber device; placed for symptomatic bradycardia secondary to intermittent CHB   PTSD (post-traumatic stress disorder)    Pulmonary nodules    Recurrent sinusitis    Short-term memory loss    Sleep difficulties    a.) takes melatonin   Symptomatic bradycardia    a.) s/p St. Jude dual chamber PPM placement   Syncope    TIA (transient ischemic attack)    x3-last one in 2015   Tremors of nervous system    Type 2 diabetes mellitus (HCC)     Past Surgical History:  Procedure Laterality Date   ASD REPAIR  1968   BALLOON DILATION N/A 11/27/2023   Procedure: BALLOON DILATION;  Surgeon: Vinetta Greening, DO;  [facility]: [facility];  Service: Endoscopy;  Laterality: N/A;  9:00 am, asa 3   BIOPSY  01/26/2021   Procedure: BIOPSY;  Surgeon: Vinetta Greening, DO;  [facility]: [facility];  Service: Endoscopy;;  gastric   BIOPSY  11/27/2023   Procedure: BIOPSY;  Surgeon: Vinetta Greening, DO;  [facility]: [facility];  Service: Endoscopy;;   CARDIOVERSION  2009   CARDIOVERSION N/A 08/29/2018   Procedure: CARDIOVERSION;  Surgeon: Laurann Pollock,  MD;  [facility]: [facility];  Service: Endoscopy;  Laterality: N/A;   CARPAL TUNNEL RELEASE Bilateral    CESAREAN SECTION  1986, 1988, 1994   COLONOSCOPY WITH PROPOFOL  N/A 01/26/2021   Procedure: COLONOSCOPY WITH PROPOFOL ;  Surgeon: Vinetta Greening, DO;  [facility]: [facility];  Service: Endoscopy;  Laterality: N/A;  am appt, diabetic   ESOPHAGOGASTRODUODENOSCOPY (EGD) WITH PROPOFOL  N/A 01/26/2021   Procedure: ESOPHAGOGASTRODUODENOSCOPY (EGD) WITH PROPOFOL ;  Surgeon: Vinetta Greening, DO;  [facility];  Service: Endoscopy;  Laterality: N/A;   ESOPHAGOGASTRODUODENOSCOPY (EGD) WITH PROPOFOL  N/A 11/27/2023   Procedure: ESOPHAGOGASTRODUODENOSCOPY (EGD) WITH PROPOFOL ;  Surgeon: Vinetta Greening, DO;  [facility]: [facility];  Service:  Endoscopy;  Laterality: N/A;  9:00 am, asa 3   HALLUX VALGUS [place] Right 07/29/2022   Procedure: HALLUX VALGUS;  Surgeon: Floyce Hutching, DPM;  Location: [facility];  Service: Podiatry;  Laterality: Right;   HAMMER TOE SURGERY Right 07/29/2022   Procedure: HAMMER TOE CORRECTION 2ND;  Surgeon: Floyce Hutching, DPM;  Location: [facility];  Service: Podiatry;  Laterality: Right;   LEFT HEART CATH AND CORONARY ANGIOGRAPHY N/A 08/30/2019   Procedure: LEFT HEART CATH AND CORONARY ANGIOGRAPHY;  Surgeon: Lucendia Rusk, MD;  Location: [facility] INVASIVE CV LAB;  Service: Cardiovascular;  Laterality: N/A;   NASAL SINUS SURGERY  2017   PACEMAKER IMPLANT N/A 03/03/2022   Procedure: PACEMAKER IMPLANT;  Surgeon: Tammie Fall, MD;  Location: [facility] INVASIVE CV LAB;  Service: Cardiovascular;  Laterality: N/A;   POLYPECTOMY  01/26/2021   Procedure: POLYPECTOMY;  Surgeon: Vinetta Greening, DO;  Location: [facility];  Service: Endoscopy;;  colon   TOOTH EXTRACTION N/A 07/04/2023   Procedure: DENTAL RESTORATION/EXTRACTIONS;  Surgeon: Ascencion Lava, DMD;  Location: [facility];  Service: Oral Surgery;  Laterality: N/A;    Prior to Admission medications   Medication Sig Start Date End Date Taking? Authorizing Provider  atorvastatin  (LIPITOR) 20 MG tablet Take 20 mg by mouth at bedtime. 12/26/22  Yes [provider]  bisoprolol  (ZEBETA ) 10 MG tablet Take 1 tablet (10 mg total) by mouth daily. 08/16/23  Yes BranchJoyceann No, MD  budesonide  (PULMICORT ) 0.5 MG/2ML nebulizer solution Take 0.5 mg via nebulizer twice a day for the next 1-2 weeks or until cough and wheeze free, then stop 01/24/24  Yes Ambs, Jeanmarie Millet, FNP  Cholecalciferol (VITAMIN D3) 125 MCG (5000 UT) CAPS Take 1 capsule (5,000 Units total) by mouth daily. 11/25/20  Yes Nida, Gebreselassie W, MD  diltiazem  (CARDIZEM  CD) 300 MG 24 hr capsule Take 1 capsule (300 mg total) by mouth daily. 08/16/23  Yes BranchJoyceann No, MD  divalproex  (DEPAKOTE  ER) 500 MG  24 hr tablet Take 1 tablet (500 mg total) by mouth daily AND 2 tablets (1,000 mg total) at bedtime. 02/10/24 05/10/24 Yes Hisada, Ivan Marion, MD  ELDERBERRY PO Take 4 g by mouth daily. Gummies 2g each   Yes [provider]  FERROUS SULFATE  PO Take 65 mg by mouth every evening.   Yes [provider]  fluticasone  furoate-vilanterol (BREO ELLIPTA ) 100-25 MCG/ACT AEPB Inhale 1 puff into the lungs daily. 10/25/23  Yes Rochester Chuck, MD  gabapentin  (NEURONTIN ) 600 MG tablet Take 600 mg by mouth 3 (three) times daily. 07/20/21  Yes [provider]  hydrOXYzine  (ATARAX ) 25 MG tablet TAKE ONE TABLET BY MOUTH THREE TIMES DAILY AS NEEDED FOR ANXIETY OR FOR ITCHING 03/10/23  Yes Rochester Chuck, MD  lansoprazole  (PREVACID )  30 MG capsule Take 1 capsule (30 mg total) by mouth 2 (two) times daily before a meal. 01/15/24  Yes Lanney Pitts, PA-C  levothyroxine  (SYNTHROID , LEVOTHROID) 112 MCG tablet Take 112 mcg by mouth daily before breakfast.   Yes [provider]  linaclotide  (LINZESS ) 290 MCG CAPS capsule Take 1 capsule (290 mcg total) by mouth daily before breakfast. 10/24/23  Yes Lanney Pitts, PA-C  losartan  (COZAAR ) 50 MG tablet Take 50 mg by mouth every morning.   Yes [provider]  MELATONIN GUMMIES PO Take 10 mg by mouth at bedtime.   Yes [provider]  metFORMIN  (GLUCOPHAGE ) 500 MG tablet Take 500 mg by mouth 2 (two) times daily. 07/21/21  Yes [provider]  montelukast  (SINGULAIR ) 10 MG tablet TAKE 1 TABLET BY MOUTH AT  BEDTIME 01/01/24  Yes Rochester Chuck, MD  Multiple Vitamins-Minerals (HAIR SKIN & NAILS PO) Take 1 tablet by mouth daily.   Yes [provider]  Multiple Vitamins-Minerals (MULTIVITAMIN WITH MINERALS) tablet Take 1 tablet by mouth daily. Woman   Yes [provider]  Potassium Chloride  ER 20 MEQ TBCR Take 20 mEq by mouth daily. 01/04/21  Yes [provider]  sertraline  (ZOLOFT ) 50 MG  tablet Take 1 tablet (50 mg total) by mouth at bedtime. 01/22/24 04/21/24 Yes Hisada, Ivan Marion, MD  topiramate  (TOPAMAX ) 100 MG tablet TAKE 2  TABLETS BY MOUTH  IN THE MORNING AND 1 TABLET IN  THE EVENING 01/29/24  Yes Wertman, Sara E, PA-C  apixaban  (ELIQUIS ) 5 MG TABS tablet TAKE 1 TABLET BY MOUTH TWICE  DAILY 01/01/24   Laurann Pollock, MD  Azelastine  HCl 137 MCG/SPRAY SOLN Place 1 spray into both nostrils daily. 10/25/23   Rochester Chuck, MD  BD PEN NEEDLE NANO 2ND GEN 32G X 4 MM MISC 1 each by Other route in the morning, at noon, in the evening, and at bedtime. 05/16/22   Nida, Gebreselassie W, MD  blood glucose meter kit and supplies 1 each by Other route 4 (four) times daily. Dispense based on patient and insurance preference. Use up to four times daily as directed. (FOR ICD-10 E10.9, E11.9). 01/27/21   Nida, Gebreselassie W, MD  Continuous Glucose Sensor (FREESTYLE LIBRE 3 SENSOR) MISC 1 Piece by Does not apply route every 14 (fourteen) days. Place 1 sensor on the skin every 14 days. Use to check glucose continuously 04/07/23   Nida, Gebreselassie W, MD  empagliflozin  (JARDIANCE ) 10 MG TABS tablet TAKE 1 TABLET BY MOUTH DAILY  BEFORE BREAKFAST 01/16/24   Nida, Gebreselassie W, MD  EPINEPHrine  0.3 mg/0.3 mL IJ SOAJ injection Inject 0.3 mg into the muscle once as needed for anaphylaxis. 01/17/20   [provider]  FASENRA  PEN 30 MG/ML prefilled autoinjector INJECT 1 PEN SUBCUTANEOUSLY  EVERY 8 WEEKS 12/04/23   Rochester Chuck, MD  Immune Globulin , Human, (CUVITRU  [place]) Inject 27 mg into the skin every 14 (fourteen) days. 12/24/21   [provider]  insulin  glargine (LANTUS  SOLOSTAR) 100 UNIT/ML Solostar Pen Inject 30 Units into the skin at bedtime. 11/16/23   Nida, Gebreselassie W, MD  Insulin  Pen Needle (PEN NEEDLES 3/16") 31G X 5 MM MISC Use as directed with insulin  pen 10/21/21   Nida, Gebreselassie W, MD  levalbuterol  (XOPENEX  HFA) 45 MCG/ACT inhaler USE 2 INHALATIONS BY MOUTH EVERY  6 HOURS AS NEEDED FOR WHEEZING 11/14/23   Rochester Chuck, MD  levalbuterol  (XOPENEX ) 1.25 MG/3ML nebulizer solution Use 1 vial in  the nebulizer every 6 hours as needed for cough, wheeze, shortness of breath or chest tightness. 11/03/23   Rochester Chuck, MD  Nebulizer MISC 1 Device by Other route as directed. Nebulizer tubing kit 10/25/23   Rochester Chuck, MD  ondansetron  (ZOFRAN -ODT) 8 MG disintegrating tablet Take 8 mg by mouth daily as needed for vomiting or nausea.    [provider]  [facility] ULTRA test strip 1 each by Other route in the morning, at noon, in the evening, and at bedtime. 10/17/19   [provider]  polyethylene glycol (MIRALAX  / GLYCOLAX ) 17 g packet Take 17 g by mouth daily as needed. Patient taking differently: Take 17 g by mouth daily as needed for moderate constipation or severe constipation. 04/17/21   [place], Eric J, DO  Spacer/Aero-Holding Chambers DEVI 1 Device by Does not apply route as directed. 10/25/23   Rochester Chuck, MD  TRULICITY  4.5 MG/0.5ML [facility] THE CONTENTS OF ONE PEN  SUBCUTANEOUSLY WEEKLY AS  DIRECTED 01/04/24   Baby Bolt, MD    Allergies as of 01/25/2024 - Review Complete 01/24/2024  Allergen Reaction Noted   Ativan [lorazepam] Hives 02/06/2018   Phenergan [promethazine hcl] Hives 02/06/2018    Family History  Problem Relation Age of Onset   Hypertension Mother    Stroke Mother    Kidney disease Mother    Heart attack Mother    Alcohol abuse Mother    Colon cancer Father        colon cancer   Kidney disease Sister    Hypertension Sister    Bipolar disorder Sister    Atrial fibrillation Sister    Heart disease Brother    Prostate cancer Brother    Thyroid  disease Daughter    Hashimoto's thyroiditis Daughter    Celiac disease Daughter    Allergic rhinitis Daughter    Stroke Maternal Aunt    Heart disease Maternal Aunt    Asthma Neg Hx     Social History   Socioeconomic History    Marital status: Widowed    Spouse name: Not on file   Number of children: 3   Years of education: 10   Highest education level: Not on file  Occupational History   Not on file  Tobacco Use   Smoking status: Former    Current packs/day: 0.00    Average packs/day: 1 pack/day for 28.0 years (28.0 ttl pk-yrs)    Types: Cigarettes    Start date: 21    Quit date: 2008    Years since quitting: 17.3   Smokeless tobacco: Never  Vaping Use   Vaping status: Never Used  Substance and Sexual Activity   Alcohol use: Not Currently   Drug use: Not Currently   Sexual activity: Not Currently    Birth control/protection: Post-menopausal  Other Topics Concern   Not on file  Social History Narrative   Right handed   Drinks caffeine   One story home   Social Drivers of Health   Financial Resource Strain: High Risk (01/06/2022)   Overall Financial Resource Strain (CARDIA)    Difficulty of Paying Living Expenses: Hard  Food Insecurity: No Food Insecurity (01/18/2024)   Hunger Vital Sign    Worried About Running Out of Food in the Last Year: Never true    Ran Out of Food in the Last Year: Never true  Transportation Needs: No Transportation Needs (01/18/2024)   PRAPARE - Administrator, Civil Service (Medical): No  Lack of Transportation (Non-Medical): No  Physical Activity: Insufficiently Active (01/06/2022)   Exercise Vital Sign    Days of Exercise per Week: 4 days    Minutes of Exercise per Session: 30 min  Stress: No Stress Concern Present (01/06/2022)   [employer] of Occupational Health - Occupational Stress Questionnaire    Feeling of Stress : Only a little  Social Connections: Moderately Integrated (01/06/2022)   Social Connection and Isolation Panel [NHANES]    Frequency of Communication with Friends and Family: More than three times a week    Frequency of Social Gatherings with Friends and Family: More than three times a week    Attends Religious Services: More  than 4 times per year    Active Member of [employer] or Organizations: Yes    Attends Banker Meetings: More than 4 times per year    Marital Status: Widowed  Intimate Partner Violence: Not At Risk (01/18/2024)   Humiliation, Afraid, Rape, and Kick questionnaire    Fear of Current or Ex-Partner: No    Emotionally Abused: No    Physically Abused: No    Sexually Abused: No    Review of Systems: See HPI, otherwise negative ROS  Physical Exam: Vital signs in last 24 hours: Temp:  [98.1 F (36.7 C)] 98.1 F (36.7 C) (05/08 0803) Pulse Rate:  [78] 78 (05/08 0803) Resp:  [16] 16 (05/08 0803) BP: (133)/(54) 133/54 (05/08 0803) SpO2:  [99 %] 99 % (05/08 0803) Weight:  [83 kg] 83 kg (05/08 0803)   General:   Alert,  Well-developed, well-nourished, pleasant and cooperative in NAD Head:  Normocephalic and atraumatic. Eyes:  Sclera clear, no icterus.   Conjunctiva pink. Ears:  Normal auditory acuity. Nose:  No deformity, discharge,  or lesions. Msk:  Symmetrical without gross deformities. Normal posture. Extremities:  Without clubbing or edema. Neurologic:  Alert and  oriented x4;  grossly normal neurologically. Skin:  Intact without significant lesions or rashes. Psych:  Alert and cooperative. Normal mood and affect.  Impression/Plan: Laura [name] is here for a colonoscopy to be performed for change in bowel habits, weight loss.  The risks of the procedure including infection, bleed, or perforation as well as benefits, limitations, alternatives and imponderables have been reviewed with the patient. Questions have been answered. All parties agreeable.

## 2024-02-15 NOTE — Anesthesia Preprocedure Evaluation (Signed)
 Anesthesia Evaluation  Patient identified by MRN, date of birth, ID band Patient awake    Reviewed: Allergy & Precautions, H&P , NPO status , Patient's Chart, lab work & pertinent test results, reviewed documented beta blocker date and time   History of Anesthesia Complications (+) history of anesthetic complications  Airway Mallampati: II  TM Distance: >3 FB Neck ROM: full    Dental no notable dental hx.    Pulmonary neg pulmonary ROS, shortness of breath, asthma , sleep apnea , COPD, Patient abstained from smoking., former smoker   Pulmonary exam normal breath sounds clear to auscultation       Cardiovascular Exercise Tolerance: Good hypertension, +CHF  negative cardio ROS + dysrhythmias Atrial Fibrillation + pacemaker  Rhythm:regular Rate:Normal     Neuro/Psych  Headaches PSYCHIATRIC DISORDERS Anxiety  Bipolar Disorder   TIA Neuromuscular disease negative neurological ROS  negative psych ROS   GI/Hepatic negative GI ROS, Neg liver ROS,GERD  ,,  Endo/Other  negative endocrine ROSdiabetesHypothyroidism    Renal/GU negative Renal ROS  negative genitourinary   Musculoskeletal   Abdominal   Peds  Hematology negative hematology ROS (+) Blood dyscrasia, anemia   Anesthesia Other Findings   Reproductive/Obstetrics negative OB ROS                             Anesthesia Physical Anesthesia Plan  ASA: 3  Anesthesia Plan: General   Post-op Pain Management:    Induction:   PONV Risk Score and Plan: Propofol infusion  Airway Management Planned:   Additional Equipment:   Intra-op Plan:   Post-operative Plan:   Informed Consent: I have reviewed the patients History and Physical, chart, labs and discussed the procedure including the risks, benefits and alternatives for the proposed anesthesia with the patient or authorized representative who has indicated his/her understanding and  acceptance.     Dental Advisory Given  Plan Discussed with: CRNA  Anesthesia Plan Comments:        Anesthesia Quick Evaluation

## 2024-02-15 NOTE — Op Note (Signed)
 [facility] Patient Name: Laura [name] Procedure Date: 02/15/2024 8:47 AM MRN: [id number] Date of Birth: 10/16/1960 Attending MD: Rolando Cliche. Mordechai April , [place], [id number] CSN: [id number] Age: 63 Admit Type: Outpatient Procedure:                Colonoscopy Indications:              Chronic diarrhea, Change in bowel habits Providers:                Rolando Cliche. Mordechai April, DO, Tammy Vaught, RN, Graydon Lazier RN, RN, Alisa App, Annell Barrow Referring MD:              Medicines:                See the Anesthesia note for documentation of the                            administered medications Complications:            No immediate complications. Estimated Blood Loss:     Estimated blood loss was minimal. Procedure:                Pre-Anesthesia Assessment:                           - The anesthesia plan was to use monitored                            anesthesia care (MAC).                           After obtaining informed consent, the colonoscope                            was passed under direct vision. Throughout the                            procedure, the patient's blood pressure, pulse, and                            oxygen  saturations were monitored continuously. The                            PCF-[id number] ([id number]) scope was introduced through                            the anus and advanced to the the terminal ileum,                            with identification of the appendiceal orifice and                            IC valve. The colonoscopy was performed without  difficulty. The patient tolerated the procedure                            well. The quality of the bowel preparation was                            evaluated using the BBPS [facility] Bowel Preparation                            Scale) with scores of: Right Colon = 3, Transverse                            Colon = 3 and Left Colon = 3 (entire mucosa seen                             well with no residual staining, small fragments of                            stool or opaque liquid). The total BBPS score                            equals 9. Scope In: 9:05:44 AM Scope Out: 9:22:23 AM Scope Withdrawal Time: 0 hours 12 minutes 54 seconds  Total Procedure Duration: 0 hours 16 minutes 39 seconds  Findings:      Non-bleeding internal hemorrhoids were found.      Multiple large-mouthed and small-mouthed diverticula were found in the       sigmoid colon, descending colon and transverse colon.      Two sessile polyps were found in the descending colon. The polyps were 4       to 6 mm in size. These polyps were removed with a cold snare. Resection       and retrieval were complete.      Biopsies for histology were taken with a cold forceps from the ascending       colon, transverse colon and descending colon for evaluation of       microscopic colitis. Impression:               - Non-bleeding internal hemorrhoids.                           - Diverticulosis in the sigmoid colon, in the                            descending colon and in the transverse colon.                           - Two 4 to 6 mm polyps in the descending colon,                            removed with a cold snare. Resected and retrieved.                           - Biopsies were taken with a cold forceps from the  ascending colon, transverse colon and descending                            colon for evaluation of microscopic colitis. Moderate Sedation:      Per Anesthesia Care Recommendation:           - Patient has a contact number available for                            emergencies. The signs and symptoms of potential                            delayed complications were discussed with the                            patient. Return to normal activities tomorrow.                            Written discharge instructions were provided to the                             patient.                           - Resume previous diet.                           - Continue present medications.                           - Await pathology results.                           - Repeat colonoscopy in 5 years for surveillance.                           - Return to GI clinic in 8 weeks. Procedure Code(s):        --- Professional ---                           [phone], Colonoscopy, flexible; with removal of                            tumor(s), polyp(s), or other lesion(s) by snare                            technique                           45380, 59, Colonoscopy, flexible; with biopsy,                            single or multiple Diagnosis Code(s):        --- Professional ---                           K64.8, Other hemorrhoids  D12.4, Benign neoplasm of descending colon                           K52.9, Noninfective gastroenteritis and colitis,                            unspecified                           R19.4, Change in bowel habit                           K57.30, Diverticulosis of large intestine without                            perforation or abscess without bleeding CPT copyright 2022 American Medical Association. All rights reserved. The codes documented in this report are preliminary and upon coder review may  be revised to meet current compliance requirements. Rolando Cliche. Mordechai April, DO Rolando Cliche. Leilan Bochenek, DO 02/15/2024 9:25:30 AM This report has been signed electronically. Number of Addenda: 0

## 2024-02-15 NOTE — Transfer of Care (Signed)
 Immediate Anesthesia Transfer of Care Note  Patient: Laura [name]  Procedure(s) Performed: COLONOSCOPY  Patient Location: PACU and Endoscopy Unit  Anesthesia Type:MAC  Level of Consciousness: awake and alert   Airway & Oxygen  Therapy: Patient Spontanous Breathing and Patient connected to nasal cannula oxygen   Post-op Assessment: Report given to RN and Post -op Vital signs reviewed and stable  Post vital signs: Reviewed and stable  Last Vitals:  Vitals Value Taken Time  BP    Temp    Pulse    Resp    SpO2      Last Pain:  Vitals:   02/15/24 0858  TempSrc:   PainSc: 4       Patients Stated Pain Goal: 5 (02/15/24 0803)  Complications: No notable events documented.

## 2024-02-16 ENCOUNTER — Encounter (HOSPITAL_COMMUNITY): Payer: Self-pay | Admitting: Internal Medicine

## 2024-02-16 LAB — SURGICAL PATHOLOGY

## 2024-02-16 NOTE — Anesthesia Postprocedure Evaluation (Signed)
 Anesthesia Post Note  Patient: Laura [name]  Procedure(s) Performed: COLONOSCOPY  Patient location during evaluation: Phase II Anesthesia Type: General Level of consciousness: awake Pain management: pain level controlled Vital Signs Assessment: post-procedure vital signs reviewed and stable Respiratory status: spontaneous breathing and respiratory function stable Cardiovascular status: blood pressure returned to baseline and stable Postop Assessment: no headache and no apparent nausea or vomiting Anesthetic complications: no Comments: Late entry   No notable events documented.   Last Vitals:  Vitals:   02/15/24 0926 02/15/24 0933  BP: (!) 96/35 (!) 84/73  Pulse: 75 74  Resp: 16 16  Temp: 36.5 C   SpO2: 98% 100%    Last Pain:  Vitals:   02/15/24 0933  TempSrc:   PainSc: 0-No pain                 Coretha Dew

## 2024-02-17 ENCOUNTER — Other Ambulatory Visit: Payer: Self-pay | Admitting: "Endocrinology

## 2024-02-17 DIAGNOSIS — E1165 Type 2 diabetes mellitus with hyperglycemia: Secondary | ICD-10-CM

## 2024-02-18 ENCOUNTER — Emergency Department (HOSPITAL_COMMUNITY)

## 2024-02-18 ENCOUNTER — Emergency Department (HOSPITAL_COMMUNITY)
Admission: EM | Admit: 2024-02-18 | Discharge: 2024-02-18 | Disposition: A | Discharge status: Home / Self Care | Attending: Emergency Medicine | Admitting: Emergency Medicine

## 2024-02-18 ENCOUNTER — Encounter (HOSPITAL_COMMUNITY): Payer: Self-pay

## 2024-02-18 ENCOUNTER — Other Ambulatory Visit: Payer: Self-pay

## 2024-02-18 DIAGNOSIS — Z794 Long term (current) use of insulin: Secondary | ICD-10-CM | POA: Insufficient documentation

## 2024-02-18 DIAGNOSIS — R0602 Shortness of breath: Secondary | ICD-10-CM | POA: Diagnosis not present

## 2024-02-18 DIAGNOSIS — I2510 Atherosclerotic heart disease of native coronary artery without angina pectoris: Secondary | ICD-10-CM | POA: Diagnosis not present

## 2024-02-18 DIAGNOSIS — Z7901 Long term (current) use of anticoagulants: Secondary | ICD-10-CM | POA: Insufficient documentation

## 2024-02-18 DIAGNOSIS — R0789 Other chest pain: Secondary | ICD-10-CM | POA: Diagnosis not present

## 2024-02-18 DIAGNOSIS — R079 Chest pain, unspecified: Secondary | ICD-10-CM | POA: Diagnosis not present

## 2024-02-18 LAB — COMPREHENSIVE METABOLIC PANEL WITH GFR
ALT: 14 U/L (ref 0–44)
AST: 16 U/L (ref 15–41)
Albumin: 3.6 g/dL (ref 3.5–5.0)
Alkaline Phosphatase: 62 U/L (ref 38–126)
Anion gap: 6 (ref 5–15)
BUN: 19 mg/dL (ref 8–23)
CO2: 21 mmol/L — ABNORMAL LOW (ref 22–32)
Calcium: 8.9 mg/dL (ref 8.9–10.3)
Chloride: 110 mmol/L (ref 98–111)
Creatinine, Ser: 0.66 mg/dL (ref 0.44–1.00)
GFR, Estimated: >60 mL/min (ref >60)
Glucose, Bld: 133 mg/dL — ABNORMAL HIGH (ref 70–99)
Potassium: 4.3 mmol/L (ref 3.5–5.1)
Sodium: 137 mmol/L (ref 135–145)
Total Bilirubin: 0.5 mg/dL (ref 0.0–1.2)
Total Protein: 7 g/dL (ref 6.5–8.1)

## 2024-02-18 LAB — CBC WITH DIFFERENTIAL/PLATELET
Abs Immature Granulocytes: 0.01 10*3/uL (ref 0.00–0.07)
Basophils Absolute: 0 10*3/uL (ref 0.0–0.1)
Basophils Relative: 1 %
Eosinophils Absolute: 0 10*3/uL (ref 0.0–0.5)
Eosinophils Relative: 0 %
HCT: 41.2 % (ref 36.0–46.0)
Hemoglobin: 14.5 g/dL (ref 12.0–15.0)
Immature Granulocytes: 0 %
Lymphocytes Relative: 33 %
Lymphs Abs: 1.4 10*3/uL (ref 0.7–4.0)
MCH: 35.1 pg — ABNORMAL HIGH (ref 26.0–34.0)
MCHC: 35.2 g/dL (ref 30.0–36.0)
MCV: 99.8 fL (ref 80.0–100.0)
Monocytes Absolute: 0.3 10*3/uL (ref 0.1–1.0)
Monocytes Relative: 8 %
Neutro Abs: 2.5 10*3/uL (ref 1.7–7.7)
Neutrophils Relative %: 58 %
Platelets: 162 10*3/uL (ref 150–400)
RBC: 4.13 MIL/uL (ref 3.87–5.11)
RDW: 13.1 % (ref 11.5–15.5)
WBC: 4.2 10*3/uL (ref 4.0–10.5)
nRBC: 0 % (ref 0.0–0.2)

## 2024-02-18 LAB — D-DIMER, QUANTITATIVE: D-Dimer, Quant: 0.29 ug{FEU}/mL (ref 0.00–0.50)

## 2024-02-18 LAB — TROPONIN I (HIGH SENSITIVITY)
Troponin I (High Sensitivity): 6 ng/L (ref <18)
Troponin I (High Sensitivity): 6 ng/L (ref <18)

## 2024-02-18 MED ORDER — ONDANSETRON HCL 4 MG/2ML IJ SOLN
INTRAMUSCULAR | Status: AC
  Filled 2024-02-18: qty 2

## 2024-02-18 MED ORDER — HYDROCODONE-ACETAMINOPHEN 5-325 MG PO TABS: 1.0000 | ORAL_TABLET | Freq: Four times a day (QID) | ORAL | 0 refills | Status: DC | PRN

## 2024-02-18 MED ORDER — ONDANSETRON HCL 4 MG/2ML IJ SOLN
4.0000 mg | Freq: Once | INTRAMUSCULAR | Status: AC
  Administered 2024-02-18: 4 mg via INTRAVENOUS

## 2024-02-18 MED ORDER — HYDROMORPHONE HCL 1 MG/ML IJ SOLN
1.0000 mg | Freq: Once | INTRAMUSCULAR | Status: AC
  Administered 2024-02-18: 1 mg via INTRAVENOUS
  Filled 2024-02-18: qty 1

## 2024-02-18 MED ORDER — SODIUM CHLORIDE 0.9 % IV BOLUS
1000.0000 mL | Freq: Once | INTRAVENOUS | Status: AC
  Administered 2024-02-18: 1000 mL via INTRAVENOUS

## 2024-02-18 NOTE — ED Provider Notes (Signed)
 [facility] AT [facility] Provider Note   CSN: [id number] Arrival date & time: 02/18/24  4098     History {Add pertinent medical, surgical, social history, OB history to HPI:1} Chief Complaint  Patient presents with   Chest Pain    Laura [name] is a 63 y.o. female.  Patient has a history of coronary artery disease and complains of left-sided pleuritic chest pain   Chest Pain      Home Medications Prior to Admission medications   Medication Sig Start Date End Date Taking? Authorizing Provider  apixaban  (ELIQUIS ) 5 MG TABS tablet TAKE 1 TABLET BY MOUTH TWICE  DAILY 01/01/24  Yes Branch, Joyceann No, MD  atorvastatin  (LIPITOR) 20 MG tablet Take 20 mg by mouth at bedtime. 12/26/22  Yes [provider]  Azelastine  HCl 137 MCG/SPRAY SOLN Place 1 spray into both nostrils daily. Patient taking differently: Place 1 spray into both nostrils daily as needed. 10/25/23  Yes Rochester Chuck, MD  bisoprolol  (ZEBETA ) 10 MG tablet Take 1 tablet (10 mg total) by mouth daily. 08/16/23  Yes BranchJoyceann No, MD  budesonide  (PULMICORT ) 0.5 MG/2ML nebulizer solution Take 0.5 mg via nebulizer twice a day for the next 1-2 weeks or until cough and wheeze free, then stop Patient taking differently: Take 0.5 mg by nebulization daily as needed. Take 0.5 mg via nebulizer twice a day for the next 1-2 weeks or until cough and wheeze free, then stop 01/24/24  Yes Ambs, Jeanmarie Millet, FNP  Cholecalciferol (VITAMIN D3) 125 MCG (5000 UT) CAPS Take 1 capsule (5,000 Units total) by mouth daily. 11/25/20  Yes Nida, Gebreselassie W, MD  diltiazem  (CARDIZEM  CD) 300 MG 24 hr capsule Take 1 capsule (300 mg total) by mouth daily. 08/16/23  Yes BranchJoyceann No, MD  divalproex  (DEPAKOTE  ER) 500 MG 24 hr tablet Take 1 tablet (500 mg total) by mouth daily AND 2 tablets (1,000 mg total) at bedtime. 02/10/24 05/10/24 Yes Hisada, Ivan Marion, MD  ELDERBERRY PO Take 4 g by mouth daily. Gummies 2g each    Yes [provider]  empagliflozin  (JARDIANCE ) 10 MG TABS tablet TAKE 1 TABLET BY MOUTH DAILY  BEFORE BREAKFAST 01/16/24  Yes Nida, Gebreselassie W, MD  EPINEPHrine  0.3 mg/0.3 mL IJ SOAJ injection Inject 0.3 mg into the muscle once as needed for anaphylaxis. 01/17/20  Yes [provider]  FERROUS SULFATE  PO Take 65 mg by mouth every evening.   Yes [provider]  fluticasone  furoate-vilanterol (BREO ELLIPTA ) 100-25 MCG/ACT AEPB Inhale 1 puff into the lungs daily. 10/25/23  Yes Rochester Chuck, MD  gabapentin  (NEURONTIN ) 600 MG tablet Take 600 mg by mouth 3 (three) times daily. 07/20/21  Yes [provider]  HYDROcodone -acetaminophen  (NORCO/VICODIN) 5-325 MG tablet Take 1 tablet by mouth every 6 (six) hours as needed. 02/18/24  Yes Cheyenne Cotta, MD  hydrOXYzine  (ATARAX ) 25 MG tablet TAKE ONE TABLET BY MOUTH THREE TIMES DAILY AS NEEDED FOR ANXIETY OR FOR ITCHING 03/10/23  Yes Rochester Chuck, MD  insulin  glargine (LANTUS  SOLOSTAR) 100 UNIT/ML Solostar Pen Inject 30 Units into the skin at bedtime. 11/16/23  Yes Nida, Gebreselassie W, MD  lansoprazole  (PREVACID ) 30 MG capsule Take 1 capsule (30 mg total) by mouth 2 (two) times daily before a meal. 01/15/24  Yes Lanney Pitts, PA-C  levalbuterol  (XOPENEX  HFA) 45 MCG/ACT inhaler USE 2 INHALATIONS BY MOUTH EVERY 6 HOURS AS NEEDED FOR WHEEZING 11/14/23  Yes Rochester Chuck, MD  levalbuterol  (  XOPENEX ) 1.25 MG/3ML nebulizer solution Use 1 vial in the nebulizer every 6 hours as needed for cough, wheeze, shortness of breath or chest tightness. 11/03/23  Yes Rochester Chuck, MD  levothyroxine  (SYNTHROID , LEVOTHROID) 112 MCG tablet Take 112 mcg by mouth daily before breakfast.   Yes [provider]  linaclotide  (LINZESS ) 290 MCG CAPS capsule Take 1 capsule (290 mcg total) by mouth daily before breakfast. 10/24/23  Yes Lanney Pitts, PA-C  losartan  (COZAAR ) 50 MG tablet Take 50 mg by mouth every morning.    Yes [provider]  MELATONIN GUMMIES PO Take 10 mg by mouth at bedtime.   Yes [provider]  metFORMIN  (GLUCOPHAGE ) 500 MG tablet Take 500 mg by mouth 2 (two) times daily. 07/21/21  Yes [provider]  montelukast  (SINGULAIR ) 10 MG tablet TAKE 1 TABLET BY MOUTH AT  BEDTIME 01/01/24  Yes Rochester Chuck, MD  Multiple Vitamins-Minerals (HAIR SKIN & NAILS PO) Take 1 tablet by mouth daily.   Yes [provider]  Multiple Vitamins-Minerals (MULTIVITAMIN WITH MINERALS) tablet Take 1 tablet by mouth daily. Woman   Yes [provider]  ondansetron  (ZOFRAN -ODT) 8 MG disintegrating tablet Take 8 mg by mouth daily as needed for vomiting or nausea.   Yes [provider]  polyethylene glycol (MIRALAX  / GLYCOLAX ) 17 g packet Take 17 g by mouth daily as needed. Patient taking differently: Take 17 g by mouth daily as needed for moderate constipation or severe constipation. 04/17/21  Yes [place], Eric J, DO  Potassium Chloride  ER 20 MEQ TBCR Take 20 mEq by mouth daily. 01/04/21  Yes [provider]  sertraline  (ZOLOFT ) 50 MG tablet Take 1 tablet (50 mg total) by mouth at bedtime. 01/22/24 04/21/24 Yes Hisada, Ivan Marion, MD  topiramate  (TOPAMAX ) 100 MG tablet TAKE 2  TABLETS BY MOUTH  IN THE MORNING AND 1 TABLET IN  THE EVENING 01/29/24  Yes Wertman, Sara E, PA-C  TRULICITY  4.5 MG/0.5ML SOAJ INJECT THE CONTENTS OF ONE PEN  SUBCUTANEOUSLY WEEKLY AS  DIRECTED Patient taking differently: Inject 4.5 mg into the skin every Sunday. 01/04/24  Yes Nida, Jaynee Meyer, MD  BD PEN NEEDLE NANO 2ND GEN 32G X 4 MM MISC 1 each by Other route in the morning, at noon, in the evening, and at bedtime. 05/16/22   Nida, Gebreselassie W, MD  blood glucose meter kit and supplies 1 each by Other route 4 (four) times daily. Dispense based on patient and insurance preference. Use up to four times daily as directed. (FOR ICD-10 E10.9, E11.9). 01/27/21   Nida, Gebreselassie W, MD   Continuous Glucose Sensor (FREESTYLE LIBRE 3 SENSOR) MISC 1 Piece by Does not apply route every 14 (fourteen) days. Place 1 sensor on the skin every 14 days. Use to check glucose continuously 04/07/23   Nida, Gebreselassie W, MD  doxycycline  (VIBRAMYCIN ) 100 MG capsule Take 100 mg by mouth 2 (two) times daily. Patient not taking: Reported on 02/18/2024 02/09/24   [provider]  FASENRA  PEN 30 MG/ML prefilled autoinjector INJECT 1 PEN SUBCUTANEOUSLY  EVERY 8 WEEKS 12/04/23   Rochester Chuck, MD  Immune Globulin , Human, (CUVITRU  [facility]) Inject 27 mg into the skin every 14 (fourteen) days. 12/24/21   [provider]  Insulin  Pen Needle (PEN NEEDLES 3/16") 31G X 5 MM MISC Use as directed with insulin  pen 10/21/21   Nida, Gebreselassie W, MD  Nebulizer MISC 1 Device by Other route as directed. Nebulizer tubing kit 10/25/23  Rochester Chuck, MD  [facility] ULTRA test strip 1 each by Other route in the morning, at noon, in the evening, and at bedtime. 10/17/19   [provider]  Spacer/Aero-Holding Idelle Majors DEVI 1 Device by Does not apply route as directed. 10/25/23   Rochester Chuck, MD      Allergies    Ativan [lorazepam] and Phenergan [promethazine hcl]    Review of Systems   Review of Systems  Cardiovascular:  Positive for chest pain.    Physical Exam Updated Vital Signs BP (!) 109/54   Pulse 62   Temp 97.8 F (36.6 C) (Oral)   Resp 13   Ht 5\' 4"  (1.626 m)   Wt 83 kg   SpO2 95%   BMI 31.41 kg/m  Physical Exam  ED Results / Procedures / Treatments   Labs (all labs ordered are listed, but only abnormal results are displayed) Labs Reviewed  CBC WITH DIFFERENTIAL/PLATELET - Abnormal; Notable for the following components:      Result Value   MCH 35.1 (*)    All other components within normal limits  COMPREHENSIVE METABOLIC PANEL WITH GFR - Abnormal; Notable for the following components:   CO2 21 (*)    Glucose, Bld 133 (*)    All other components  within normal limits  D-DIMER, QUANTITATIVE  TROPONIN I (HIGH SENSITIVITY)  TROPONIN I (HIGH SENSITIVITY)    EKG EKG Interpretation Date/Time:  Sunday Feb 18 2024 08:14:42 EDT Ventricular Rate:  60 PR Interval:  191 QRS Duration:  129 QT Interval:  484 QTC Calculation: 484 R Axis:   -68  Text Interpretation: Sinus or ectopic atrial rhythm IVCD, consider atypical RBBB Anterior infarct, old Confirmed by Cheyenne Cotta [phone]) on 02/18/2024 8:48:11 AM  Radiology DG Chest Port 1 View Result Date: 02/18/2024 CLINICAL DATA:  Shortness of breath.  Chest pain. EXAM: PORTABLE CHEST 1 VIEW COMPARISON:  10/06/2023. FINDINGS: Heart size scratch set left chest wall pacer noted with leads in the right atrial appendage and right ventricle. Stable cardiomediastinal contours. No pleural fluid, interstitial edema or airspace consolidation. No acute osseous findings. IMPRESSION: No acute cardiopulmonary disease. Electronically Signed   By: Kimberley Penman M.D.   On: 02/18/2024 10:45    Procedures Procedures  {Document cardiac monitor, telemetry assessment procedure when appropriate:1}  Medications Ordered in ED Medications  HYDROmorphone  (DILAUDID ) injection 1 mg (1 mg Intravenous Given 02/18/24 0914)  ondansetron  (ZOFRAN ) injection 4 mg (4 mg Intravenous Given 02/18/24 0913)  sodium chloride  0.9 % bolus 1,000 mL (1,000 mLs Intravenous New Bag/Given 02/18/24 1156)    ED Course/ Medical Decision Making/ A&P   {   Click here for ABCD2, HEART and other calculatorsREFRESH Note before signing :1}                              Medical Decision Making Amount and/or Complexity of Data Reviewed Labs: ordered. Radiology: ordered.  Risk Prescription drug management.  Patient with pleuritis.  She is given Vicodin and will follow-up with her PCP  {Document critical care time when appropriate:1} {Document review of labs and clinical decision tools ie heart score, Chads2Vasc2 etc:1}  {Document your  independent review of radiology images, and any outside records:1} {Document your discussion with family members, caretakers, and with consultants:1} {Document social determinants of health affecting pt's care:1} {Document your decision making why or why not admission, treatments were needed:1} Final Clinical Impression(s) / ED Diagnoses Final diagnoses:  Atypical chest pain    Rx / DC Orders ED Discharge Orders          Ordered    HYDROcodone -acetaminophen  (NORCO/VICODIN) 5-325 MG tablet  Every 6 hours PRN        02/18/24 1314

## 2024-02-18 NOTE — ED Notes (Signed)
 Received report from [facility]. Called lab due to no lab values, they stated will run them now. Pt sleeping/snoring upon entrance. Needing loud name calling to wake. Pt states sharp pains to chest that is intermittent is 6 a the moment. No resp distress/shob noted. Non diaphoretic. Color wnl.

## 2024-02-18 NOTE — Discharge Instructions (Signed)
 Take Tylenol  for pain but if that does not help you can take the hydrocodone  that I have prescribed.  Follow-up with your doctor next week for recheck if not improving

## 2024-02-18 NOTE — ED Notes (Signed)
 Pt asymptomatic with bp, edp aware. Nad. A/o when awake

## 2024-02-18 NOTE — ED Triage Notes (Signed)
 Patient came from home for chest pain. Patient has recently seen a cardiologist, has a history of AFIB. Patient has had nausea, shortness of breath. 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

## 2024-02-21 ENCOUNTER — Other Ambulatory Visit (HOSPITAL_COMMUNITY)

## 2024-02-23 ENCOUNTER — Telehealth: Payer: Self-pay

## 2024-02-23 DIAGNOSIS — E1142 Type 2 diabetes mellitus with diabetic polyneuropathy: Secondary | ICD-10-CM | POA: Diagnosis not present

## 2024-02-23 DIAGNOSIS — I10 Essential (primary) hypertension: Secondary | ICD-10-CM | POA: Diagnosis not present

## 2024-02-23 DIAGNOSIS — I480 Paroxysmal atrial fibrillation: Secondary | ICD-10-CM | POA: Diagnosis not present

## 2024-02-23 DIAGNOSIS — R0789 Other chest pain: Secondary | ICD-10-CM | POA: Diagnosis not present

## 2024-02-23 DIAGNOSIS — D126 Benign neoplasm of colon, unspecified: Secondary | ICD-10-CM | POA: Diagnosis not present

## 2024-02-23 DIAGNOSIS — E1169 Type 2 diabetes mellitus with other specified complication: Secondary | ICD-10-CM | POA: Diagnosis not present

## 2024-02-23 DIAGNOSIS — D696 Thrombocytopenia, unspecified: Secondary | ICD-10-CM | POA: Diagnosis not present

## 2024-02-23 DIAGNOSIS — K5900 Constipation, unspecified: Secondary | ICD-10-CM | POA: Diagnosis not present

## 2024-02-23 DIAGNOSIS — K219 Gastro-esophageal reflux disease without esophagitis: Secondary | ICD-10-CM | POA: Diagnosis not present

## 2024-02-23 NOTE — Telephone Encounter (Signed)
 [place] Dr Izetta Marshall for Laura Libra  FNP sent to chart for your review

## 2024-02-25 NOTE — Progress Notes (Deleted)
 Laura [name], female    DOB: 02-07-1961    MRN: [id number]   Brief patient profile:  63  yo*** *** former [place] pt self-referred back to pulmonary clinic in [place]  03/01/2024  for ***   bronchiectasis, CVID, OSA  Former smoker. 28 pack years. Quit in 2008. Allergy with Dr. [name] - on subcu immunoglobulin injections q2weeks and Fasenra  in [place] office. Multiple spirometries show no obst inclurind 01/24/24 which is wnl   History of Present Illness  03/01/2024  Pulmonary/ 1st office eval/ Waymond Hailey / [place] [facility]  No chief complaint on file.    Dyspnea:  *** Cough: *** Sleep: *** SABA use: *** 02 use:*** LDSCT:***  No obvious day to day or daytime pattern/variability or assoc excess/ purulent sputum or mucus plugs or hemoptysis or cp or chest tightness, subjective wheeze or overt sinus or hb symptoms.    Also denies any obvious fluctuation of symptoms with weather or environmental changes or other aggravating or alleviating factors except as outlined above   No unusual exposure hx or h/o childhood pna/ asthma or knowledge of premature birth.  Current Allergies, Complete Past Medical History, Past Surgical History, Family History, and Social History were reviewed in [employer] record.  ROS  The following are not active complaints unless bolded Hoarseness, sore throat, dysphagia, dental problems, itching, sneezing,  nasal congestion or discharge of excess mucus or purulent secretions, ear ache,   fever, chills, sweats, unintended wt loss or wt gain, classically pleuritic or exertional cp,  orthopnea pnd or arm/hand swelling  or leg swelling, presyncope, palpitations, abdominal pain, anorexia, nausea, vomiting, diarrhea  or change in bowel habits or change in bladder habits, change in stools or change in urine, dysuria, hematuria,  rash, arthralgias, visual complaints, headache, numbness, weakness or ataxia or problems with walking or coordination,   change in mood or  memory.            Outpatient Medications Prior to Visit  Medication Sig Dispense Refill   apixaban  (ELIQUIS ) 5 MG TABS tablet TAKE 1 TABLET BY MOUTH TWICE  DAILY 200 tablet 1   atorvastatin  (LIPITOR) 20 MG tablet Take 20 mg by mouth at bedtime.     Azelastine  HCl 137 MCG/SPRAY SOLN Place 1 spray into both nostrils daily. (Patient taking differently: Place 1 spray into both nostrils daily as needed.) 90 mL 1   BD PEN NEEDLE NANO 2ND GEN 32G X 4 MM MISC 1 each by Other route in the morning, at noon, in the evening, and at bedtime. 90 each 0   bisoprolol  (ZEBETA ) 10 MG tablet Take 1 tablet (10 mg total) by mouth daily. 90 tablet 3   blood glucose meter kit and supplies 1 each by Other route 4 (four) times daily. Dispense based on patient and insurance preference. Use up to four times daily as directed. (FOR ICD-10 E10.9, E11.9). 1 each 0   budesonide  (PULMICORT ) 0.5 MG/2ML nebulizer solution Take 0.5 mg via nebulizer twice a day for the next 1-2 weeks or until cough and wheeze free, then stop (Patient taking differently: Take 0.5 mg by nebulization daily as needed. Take 0.5 mg via nebulizer twice a day for the next 1-2 weeks or until cough and wheeze free, then stop) 100 mL 0   Cholecalciferol (VITAMIN D3) 125 MCG (5000 UT) CAPS Take 1 capsule (5,000 Units total) by mouth daily. 90 capsule 0   Continuous Glucose Sensor (FREESTYLE LIBRE 3 SENSOR) MISC 1 Piece by Does not apply  route every 14 (fourteen) days. Place 1 sensor on the skin every 14 days. Use to check glucose continuously 6 each 1   diltiazem  (CARDIZEM  CD) 300 MG 24 hr capsule Take 1 capsule (300 mg total) by mouth daily. 90 capsule 3   divalproex  (DEPAKOTE  ER) 500 MG 24 hr tablet Take 1 tablet (500 mg total) by mouth daily AND 2 tablets (1,000 mg total) at bedtime. 270 tablet 0   doxycycline  (VIBRAMYCIN ) 100 MG capsule Take 100 mg by mouth 2 (two) times daily. (Patient not taking: Reported on 02/18/2024)     ELDERBERRY  PO Take 4 g by mouth daily. Gummies 2g each     empagliflozin  (JARDIANCE ) 10 MG TABS tablet TAKE 1 TABLET BY MOUTH DAILY  BEFORE BREAKFAST 100 tablet 0   EPINEPHrine  0.3 mg/0.3 mL IJ SOAJ injection Inject 0.3 mg into the muscle once as needed for anaphylaxis.     FASENRA  PEN 30 MG/ML prefilled autoinjector INJECT 1 PEN SUBCUTANEOUSLY  EVERY 8 WEEKS 1 mL 8   FERROUS SULFATE  PO Take 65 mg by mouth every evening.     fluticasone  furoate-vilanterol (BREO ELLIPTA ) 100-25 MCG/ACT AEPB Inhale 1 puff into the lungs daily. 60 each 5   gabapentin  (NEURONTIN ) 600 MG tablet Take 600 mg by mouth 3 (three) times daily.     HYDROcodone -acetaminophen  (NORCO/VICODIN) 5-325 MG tablet Take 1 tablet by mouth every 6 (six) hours as needed. 20 tablet 0   hydrOXYzine  (ATARAX ) 25 MG tablet TAKE ONE TABLET BY MOUTH THREE TIMES DAILY AS NEEDED FOR ANXIETY OR FOR ITCHING 30 tablet 1   Immune Globulin , Human, (CUVITRU  Arthur) Inject 27 mg into the skin every 14 (fourteen) days.     insulin  glargine (LANTUS  SOLOSTAR) 100 UNIT/ML Solostar Pen Inject 30 Units into the skin at bedtime. 30 mL 0   Insulin  Pen Needle (PEN NEEDLES 3/16") 31G X 5 MM MISC Use as directed with insulin  pen 100 each 0   lansoprazole  (PREVACID ) 30 MG capsule Take 1 capsule (30 mg total) by mouth 2 (two) times daily before a meal. 180 capsule 3   levalbuterol  (XOPENEX  HFA) 45 MCG/ACT inhaler USE 2 INHALATIONS BY MOUTH EVERY 6 HOURS AS NEEDED FOR WHEEZING 30 g 6   levalbuterol  (XOPENEX ) 1.25 MG/3ML nebulizer solution Use 1 vial in the nebulizer every 6 hours as needed for cough, wheeze, shortness of breath or chest tightness. 225 mL 1   levothyroxine  (SYNTHROID , LEVOTHROID) 112 MCG tablet Take 112 mcg by mouth daily before breakfast.     linaclotide  (LINZESS ) 290 MCG CAPS capsule Take 1 capsule (290 mcg total) by mouth daily before breakfast. 90 capsule 3   losartan  (COZAAR ) 50 MG tablet Take 50 mg by mouth every morning.     MELATONIN GUMMIES PO Take 10 mg by  mouth at bedtime.     metFORMIN  (GLUCOPHAGE ) 500 MG tablet Take 500 mg by mouth 2 (two) times daily.     montelukast  (SINGULAIR ) 10 MG tablet TAKE 1 TABLET BY MOUTH AT  BEDTIME 100 tablet 1   Multiple Vitamins-Minerals (HAIR SKIN & NAILS PO) Take 1 tablet by mouth daily.     Multiple Vitamins-Minerals (MULTIVITAMIN WITH MINERALS) tablet Take 1 tablet by mouth daily. Woman     Nebulizer MISC 1 Device by Other route as directed. Nebulizer tubing kit 1 each 1   ondansetron  (ZOFRAN -ODT) 8 MG disintegrating tablet Take 8 mg by mouth daily as needed for vomiting or nausea.     ONETOUCH ULTRA test strip 1  each by Other route in the morning, at noon, in the evening, and at bedtime.     polyethylene glycol (MIRALAX  / GLYCOLAX ) 17 g packet Take 17 g by mouth daily as needed. (Patient taking differently: Take 17 g by mouth daily as needed for moderate constipation or severe constipation.) 14 each 0   Potassium Chloride  ER 20 MEQ TBCR Take 20 mEq by mouth daily.     sertraline  (ZOLOFT ) 50 MG tablet Take 1 tablet (50 mg total) by mouth at bedtime. 90 tablet 0   Spacer/Aero-Holding Chambers DEVI 1 Device by Does not apply route as directed. 1 each 1   topiramate  (TOPAMAX ) 100 MG tablet TAKE 2  TABLETS BY MOUTH  IN THE MORNING AND 1 TABLET IN  THE EVENING 450 tablet 3   TRULICITY  4.5 MG/0.5ML SOAJ INJECT THE CONTENTS OF ONE PEN  SUBCUTANEOUSLY WEEKLY AS  DIRECTED (Patient taking differently: Inject 4.5 mg into the skin every Sunday.) 6 mL 3   No facility-administered medications prior to visit.    Past Medical History:  Diagnosis Date   Anemia    Aortic atherosclerosis (HCC)    Asthma    Atrial fibrillation and flutter (HCC)    a.) CHA2DS2VASc = 6 (sex, HTN, TIA x2, vascular disease history, T2DM);  b.) DCCV ~ 2009; c.) DCCV 08/29/2018 (200J x1 --> SR with 1st degree AVB); d.) rate/rhythm maintained on oral diltiazem  + bisoprolol ; chronically anticoagulated with apixaban    Bipolar disorder (HCC)    Carpal  tunnel syndrome of right wrist    CHB (complete heart block) (HCC)    a.) s/p St. Jude dual chamber PPM placement   CHF (congestive heart failure) (HCC)    Complication of anesthesia    a.) "allergic reaction" to perioperative analgesics + anesthetic medications in [place] ; unsure of combination; not able to recount reactions/events --> states "I ended up in the ICU for 5 days"   Constipation    a.) on linaclotide    COPD (chronic obstructive pulmonary disease) (HCC)    DDD (degenerative disc disease), thoracic    GERD (gastroesophageal reflux disease)    H/O congenital atrial septal defect (ASD) repair    Hallux valgus of right foot    Hammertoe of second toe of right foot    History of 2019 novel coronavirus disease (COVID-19)    a.) 10/2020; b.) 05/2021   HLD (hyperlipidemia)    HTN (hypertension)    Hypogammaglobulinemia (HCC)    a.) on Curitru   Hypothyroid    IgE deficiency (HCC)    Long term current use of anticoagulant    a.) apixaban    Lumbar stenosis    Migraines    Mitral stenosis 02/08/2022   a.) TTE 02/08/2022: EF 60-65%, mild MAC, mild MV stenosis (peak grad 14.6 / mean grad 4.0)   Neuropathy    NSVT (nonsustained ventricular tachycardia) (HCC) 10/31/2018   a.) Holter 10/31/2018: 6 beat run   Obesity    OSA on CPAP    Osteoarthritis    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 Jude dual chamber device; placed for symptomatic bradycardia secondary to intermittent CHB   PTSD (post-traumatic stress disorder)    Pulmonary nodules    Recurrent sinusitis    Short-term memory loss    Sleep difficulties    a.) takes melatonin   Symptomatic bradycardia    a.) s/p St. Jude dual chamber PPM placement   Syncope    TIA (transient ischemic attack)  x3-last one in 2015   Tremors of nervous system    Type 2 diabetes mellitus (HCC)       Objective:     There were no vitals taken for this visit.         Assessment   No problem-specific  Assessment & Plan notes found for this encounter.     Vernestine Gondola, MD 02/25/2024

## 2024-02-26 ENCOUNTER — Telehealth: Payer: Self-pay | Admitting: Allergy & Immunology

## 2024-02-26 NOTE — Telephone Encounter (Signed)
 Patient called stating she was bitten by a tick 2 months ago and found the tick about 2 weeks ago. 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

## 2024-02-27 ENCOUNTER — Ambulatory Visit (INDEPENDENT_AMBULATORY_CARE_PROVIDER_SITE_OTHER): Admitting: Clinical

## 2024-02-27 DIAGNOSIS — F4310 Post-traumatic stress disorder, unspecified: Secondary | ICD-10-CM | POA: Diagnosis not present

## 2024-02-27 NOTE — Telephone Encounter (Signed)
 Thank you :)

## 2024-02-27 NOTE — Telephone Encounter (Signed)
 Noted

## 2024-02-27 NOTE — Progress Notes (Signed)
 Virtual Visit via Video Note   I connected with Laura [name] on 02/27/2024 at  10:00 AM EST by a video enabled telemedicine application and verified that I am speaking with the correct person using two identifiers.   Location: Patient: Home Provider: Office   I discussed the limitations of evaluation and management by telemedicine and the availability of in person appointments. The patient expressed understanding and agreed to proceed.   THERAPIST PROGRESS NOTE   Session Time: 10:00 AM-:10:55 AM   Participation Level: Active   Behavioral Response: CasualAlertDepressed   Type of Therapy: Individual Therapy   Treatment Goals addressed: Coping/ non-medication treatment therapy for PTSD.   Interventions: CBT, DBT, Solution Focused, Strength-based and Supportive   Summary: Laura [name] is a 63 y.o. female who presents with PTSD. The OPT therapist worked with the patient for her ongoing OPT treatment session. The OPT therapist utilized Motivational Interviewing to assist in creating therapeutic repore.The patient in the session was engaged and work in collaboration giving feedback about her triggers and symptoms over the past few weeks. The patient was seen 02/18/2024 at [name] for Atypical Chest Pain. The patient was engaged throughout the session giving feedback about her physical and mental health. The patient spoke about cancelling her trip to [place]  due to schedule conflict with another family member going out at the same time and there not being enough room for her to go with the daughter not having enough space to have more people stay. The patient spoke about re-planning her visit to go out to visit her daughter in July. The patient spoke about her most recent medication management visit with Dr. Edda [name] on 02/14/2024 and there was no a change at that time with her medication just a continuation. The patient spoke about a recent tick bite and having to take antibiotic and  restriction due to Alpha-gal from eating red meat. The OPT therapist utilized Cognitive Behavioral Therapy through cognitive restructuring as well as worked with the patient on coping strategies to assist in management of mental health symptoms. The OPT therapist worked with the patient on consistency in getting back to her health baseline and working to be consistent with her management of her external stressors with consistency especially around her interactions with other family members who she has been in conflict with over the past few months that trigger the patients PTSD. The patient spoke about impact of recent family interactions including conflict with her brother who she feels should be more involved in helping out family members who he lives close to. The OPT therapist overviewed with the patient her basic health areas including sleep, eating, exercise, and hygiene. The patient has been utilizing her coping skills for Spring including going Engineer, site.The patient spoke about her garden doing well and starting to produce.The OPT therapist placed emphasis on the patient keeping all upcoming health appointments and adhering to ongoing health treatment recommendations. The OPT therapist reviewed all appointments listed coming up in the patients MyChart    Suicidal/Homicidal: Nowithout intent/plan   Therapist Response:The OPT therapist worked with the patient for the patients scheduled session. The patient was engaged in her session and gave feedback in relation to triggers, symptoms, and behavior responses over the past few weeks. The patient spoke about cancelling her plans to go see her daughter in [place]  due to another family member going at the same time and there not being enough room at the daughters home. The OPT therapist worked with the patient utilizing  an in session Cognitive Behavioral Therapy exercise. The OPT therapist worked with the patient on the impact of her interactions  with her family members. The OPT therapist continued to work with the patient on limiting her exposure to triggering interactions and putting her focus into her own self health and care. The OPT therapist overviewed with the patient implementing coping strategies to help further manage her MH symptoms.The patient has been working with her physical health providers to manage physical health conditions. The patient spoke about her mood and interactions with family recently that she has good repore with.  The patient spoke about being upset with her brother for not being more involved in helping or interacting with family members who he lives much closer with than does the patient. The patient spoke about ongoing conflict with her middle daughter Laura [name] and still not talking with her. The patient spoke about her ongoing interaction with a guy she is currently seeing Laura [name] and the patient finding out that he is a substance user (crack cocaine) and the patient ultimately decided to block him.The patient spoke about her concern around her sister who lives in [place] . The patient spoke about her plans to review recent blood work results with Dr. Idolina [name].The OPT therapist worked with the patient overviewing upcoming appointments as listed in the patients Mychart. The OPT therapist will continue treatment work with the patient in her next scheduled session.   Plan: Return again in 2/3 weeks.   Diagnosis:      Axis I: Post Traumatic Stress Disorder                            Axis II: No diagnosis     Collaboration of Care: Overview with the patient of involvement in the Med Management program with Dr. Edda [name].   Patient/Guardian was advised Release of Information must be obtained prior to any record release in order to collaborate their care with an outside provider. Patient/Guardian was advised if they have not already done so to contact the registration department to sign all necessary forms in order for us   to release information regarding their care.    Consent: Patient/Guardian gives verbal consent for treatment and assignment of benefits for services provided during this visit. Patient/Guardian expressed understanding and agreed to proceed.      I discussed the assessment and treatment plan with the patient. The patient was provided an opportunity to ask questions and all were answered. The patient agreed with the plan and demonstrated an understanding of the instructions.   The patient was advised to call back or seek an in-person evaluation if the symptoms worsen or if the condition fails to improve as anticipated.   I provided 55 minutes of non-face-to-face time during this encounter.   Secundino Dach, LCSW    02/27/2024

## 2024-02-28 ENCOUNTER — Telehealth: Payer: Self-pay | Admitting: *Deleted

## 2024-02-28 MED ORDER — TEZSPIRE 210 MG/1.91ML ~~LOC~~ SOAJ: 210.0000 mg | SUBCUTANEOUS | 11 refills | Status: AC

## 2024-02-28 NOTE — Telephone Encounter (Signed)
 Patient called and asked about changing her biologic to another medication due to not feeling well controlled on current therapy Fasenra . She advised she had discussed with Rexene Catching at last visit changing therapy. Please advise

## 2024-02-28 NOTE — Addendum Note (Signed)
 Addended by: Evangelina Hilt on: 02/28/2024 03:31 PM   Modules accepted: Orders

## 2024-02-28 NOTE — Telephone Encounter (Signed)
 TC from patient that would reschedule follow up with me for her  next visit instead of previous dietitian. Has been diagnosed with Alpha Gal from a tick bite.

## 2024-02-28 NOTE — Telephone Encounter (Signed)
 T/c to patient advised d/c Fasenra  and Rx for Tezspire pen to [place]. Have gotten approval for same

## 2024-02-28 NOTE — Telephone Encounter (Signed)
Thank you Tammy!

## 2024-02-28 NOTE — Telephone Encounter (Signed)
 Hi Tammy, Can you please submit Tezspire for this patient? Thank you

## 2024-02-29 ENCOUNTER — Ambulatory Visit: Payer: Self-pay | Admitting: Internal Medicine

## 2024-02-29 DIAGNOSIS — D839 Common variable immunodeficiency, unspecified: Secondary | ICD-10-CM | POA: Diagnosis not present

## 2024-03-01 ENCOUNTER — Encounter: Admitting: Internal Medicine

## 2024-03-01 ENCOUNTER — Ambulatory Visit (INDEPENDENT_AMBULATORY_CARE_PROVIDER_SITE_OTHER): Payer: Medicare Other

## 2024-03-01 DIAGNOSIS — I495 Sick sinus syndrome: Secondary | ICD-10-CM | POA: Diagnosis not present

## 2024-03-01 LAB — CUP PACEART REMOTE DEVICE CHECK
Battery Remaining Longevity: 95 mo
Battery Remaining Percentage: 83 %
Battery Voltage: 2.99 V
Brady Statistic AP VP Percent: 42 %
Brady Statistic AP VS Percent: 1 %
Brady Statistic AS VP Percent: 5.6 %
Brady Statistic AS VS Percent: 51 %
Brady Statistic RA Percent Paced: 42 %
Brady Statistic RV Percent Paced: 48 %
Date Time Interrogation Session: 20250523040014
Implantable Lead Connection Status: 753985
Implantable Lead Connection Status: 753985
Implantable Lead Implant Date: 20230525
Implantable Lead Implant Date: 20230525
Implantable Lead Location: 753859
Implantable Lead Location: 753860
Implantable Pulse Generator Implant Date: 20230525
Lead Channel Impedance Value: 430 Ohm
Lead Channel Impedance Value: 440 Ohm
Lead Channel Pacing Threshold Amplitude: 0.625 V
Lead Channel Pacing Threshold Amplitude: 1 V
Lead Channel Pacing Threshold Pulse Width: 0.5 ms
Lead Channel Pacing Threshold Pulse Width: 0.5 ms
Lead Channel Sensing Intrinsic Amplitude: 11.3 mV
Lead Channel Sensing Intrinsic Amplitude: 2.2 mV
Lead Channel Setting Pacing Amplitude: 0.875
Lead Channel Setting Pacing Amplitude: 2.5 V
Lead Channel Setting Pacing Pulse Width: 0.5 ms
Lead Channel Setting Sensing Sensitivity: 0.5 mV
Pulse Gen Model: 2272
Pulse Gen Serial Number: 8084453

## 2024-03-01 NOTE — Telephone Encounter (Signed)
 Had to leave a voicemail, informing patient can scheduled first available with Dr. Waymond Hailey in July and be put on a cancellation list.   Copied from CRM [id number]. Topic: Appointments - Scheduling Inquiry for Clinic >> Mar 01, 2024 11:51 AM Laura [name] wrote: Reason for CRM: Patient has missed two appointments to [facility]. Patient is upset that she missed her most recent appointment, as she thought today was Thursday instead of Friday. Patient is upset and does not want to wait for another appointment, as she states it is an honest mistake. Patient would like to be worked in to the soonest available with Dr. Waymond Hailey.

## 2024-03-03 ENCOUNTER — Ambulatory Visit: Payer: Self-pay | Admitting: Internal Medicine

## 2024-03-05 ENCOUNTER — Ambulatory Visit
Admission: EM | Admit: 2024-03-05 | Discharge: 2024-03-05 | Disposition: A | Discharge status: Home / Self Care | Attending: Family Medicine | Admitting: Family Medicine

## 2024-03-05 ENCOUNTER — Ambulatory Visit: Admitting: Registered"

## 2024-03-05 DIAGNOSIS — L989 Disorder of the skin and subcutaneous tissue, unspecified: Secondary | ICD-10-CM | POA: Diagnosis not present

## 2024-03-05 DIAGNOSIS — W57XXXA Bitten or stung by nonvenomous insect and other nonvenomous arthropods, initial encounter: Secondary | ICD-10-CM | POA: Diagnosis not present

## 2024-03-05 MED ORDER — MUPIROCIN 2 % EX OINT: 1.0000 | TOPICAL_OINTMENT | Freq: Two times a day (BID) | CUTANEOUS | 0 refills | Status: DC

## 2024-03-05 MED ORDER — CHLORHEXIDINE GLUCONATE 4 % EX SOLN: Freq: Every day | CUTANEOUS | 0 refills | Status: DC | PRN

## 2024-03-05 NOTE — Discharge Instructions (Signed)
 Clean the areas at least once a day with Hibiclens  and apply mupirocin and a bandage until healed.

## 2024-03-05 NOTE — ED Triage Notes (Signed)
 Pt reports ticks located all over the body x 4 days

## 2024-03-05 NOTE — ED Notes (Addendum)
 Per provider, apply bandaid dressing to site on lower legs. Pt tolerated well.  Site management and infection prevention education reviewed. Pt verbalized understanding.

## 2024-03-05 NOTE — ED Provider Notes (Signed)
 [facility]    CSN: [id number] Arrival date & time: 03/05/24  1316      History   Chief Complaint No chief complaint on file.   HPI Ritika Hellickson is a 63 y.o. female.   Patient presenting today with 4-day history of multiple tics throughout body after going on a fishing trip.  She states she removed what she could but would like help finding and removing the rest.  She also does not feel like she completely was able to remove several of them out of her leg.  Denies fever, chills, nausea, vomiting, drainage or bleeding from the sites.  So far not trying anything for symptoms.    Past Medical History:  Diagnosis Date   Anemia    Aortic atherosclerosis (HCC)    Asthma    Atrial fibrillation and flutter (HCC)    a.) CHA2DS2VASc = 6 (sex, HTN, TIA x2, vascular disease history, T2DM);  b.) DCCV ~ 2009; c.) DCCV 08/29/2018 (200J x1 --> SR with 1st degree AVB); d.) rate/rhythm maintained on oral diltiazem  + bisoprolol ; chronically anticoagulated with apixaban    Bipolar disorder (HCC)    Carpal tunnel syndrome of right wrist    CHB (complete heart block) (HCC)    a.) s/p St. Jude dual chamber PPM placement   CHF (congestive heart failure) (HCC)    Complication of anesthesia    a.) "allergic reaction" to perioperative analgesics + anesthetic medications in [place] ; unsure of combination; not able to recount reactions/events --> states "I ended up in the ICU for 5 days"   Constipation    a.) on linaclotide    COPD (chronic obstructive pulmonary disease) (HCC)    DDD (degenerative disc disease), thoracic    GERD (gastroesophageal reflux disease)    H/O congenital atrial septal defect (ASD) repair    Hallux valgus of right foot    Hammertoe of second toe of right foot    History of 2019 novel coronavirus disease (COVID-19)    a.) 10/2020; b.) 05/2021   HLD (hyperlipidemia)    HTN (hypertension)    Hypogammaglobulinemia (HCC)    a.) on Curitru   Hypothyroid    IgE  deficiency (HCC)    Long term current use of anticoagulant    a.) apixaban    Lumbar stenosis    Migraines    Mitral stenosis 02/08/2022   a.) TTE 02/08/2022: EF 60-65%, mild MAC, mild MV stenosis (peak grad 14.6 / mean grad 4.0)   Neuropathy    NSVT (nonsustained ventricular tachycardia) (HCC) 10/31/2018   a.) Holter 10/31/2018: 6 beat run   Obesity    OSA on CPAP    Osteoarthritis    Presence of permanent cardiac 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 Jude dual chamber device; placed for symptomatic bradycardia secondary to intermittent CHB   PTSD (post-traumatic stress disorder)    Pulmonary nodules    Recurrent sinusitis    Short-term memory loss    Sleep difficulties    a.) takes melatonin   Symptomatic bradycardia    a.) s/p St. Jude dual chamber PPM placement   Syncope    TIA (transient ischemic attack)    x3-last one in 2015   Tremors of nervous system    Type 2 diabetes mellitus (HCC)     Patient Active Problem List   Diagnosis Date Noted   IgA deficiency (HCC) 01/20/2024   Gastrointestinal symptoms 01/15/2024   Weight loss 01/15/2024   FH: colon cancer 01/15/2024   H/O adenomatous polyp of  colon 01/15/2024   Leukopenia 01/15/2024   Thrombocytopenia (HCC) 01/15/2024   Insulin  long-term use (HCC) 03/08/2023   Inflammation of right ilioinguinal nerve 01/30/2023   Acquired hallux valgus of right foot 07/29/2022   Hammer toe of second toe of right foot 07/29/2022   Joint contracture of foot, right 07/29/2022   Neuropathy 07/29/2022   Chest pain 02/08/2022   Syncope 02/07/2022   Class 2 severe obesity due to excess calories with serious comorbidity and body mass index (BMI) of 35.0 to 35.9 in adult [place]) 01/14/2022   Acute cough 12/31/2021   Common variable immunodeficiency (HCC) 12/31/2021   Vitamin B12 deficiency 05/21/2021   Vitamin D  deficiency 05/21/2021   Abdominal pain 04/27/2021   Urinary incontinence 04/27/2021   Dysphagia 04/27/2021   Constipation  04/27/2021   Mixed 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 (HCC) 04/10/2021   Gastroesophageal reflux disease 04/10/2021   Adnexal cyst 01/19/2021   Chronic RLQ pain 01/19/2021   Mixed stress and urge urinary incontinence 01/19/2021   Other fatigue 12/10/2020   Abdominal pain, chronic, right lower quadrant 12/08/2020   Abnormal weight loss 12/08/2020   Poor appetite 12/08/2020   Severe persistent asthma with acute exacerbation 10/01/2019   Abnormal EKG    Chronic diastolic (congestive) heart failure (HCC) 08/29/2019   Shortness of breath 08/29/2019   Musculoskeletal chest pain 08/28/2019   Headache 08/10/2018   H/O atrial flutter 08/10/2018   OSA on CPAP 08/10/2018   Anxiety 08/10/2018   Uncontrolled type 2 diabetes mellitus with hyperglycemia (HCC) 03/13/2018   Hypothyroidism 03/13/2018   Essential hypertension, benign 03/13/2018   Mixed hyperlipidemia 03/13/2018   Hypogammaglobulinemia (HCC) 02/06/2018   Non-allergic rhinitis 02/06/2018   Severe persistent asthma, uncomplicated 02/06/2018   Pulmonary nodules 02/06/2018   Bronchiectasis without complication (HCC) 02/06/2018   Type 2 diabetes mellitus with hyperglycemia, with long-term current use of insulin  (HCC) 02/06/2018    Past Surgical History:  Procedure Laterality Date   ASD REPAIR  1968   BALLOON DILATION N/A 11/27/2023   Procedure: BALLOON DILATION;  Surgeon: Vinetta Greening, DO;  [facility]: [facility];  Service: Endoscopy;  Laterality: N/A;  9:00 am, asa 3   BIOPSY  01/26/2021   Procedure: BIOPSY;  Surgeon: Vinetta Greening, DO;  [facility]: [facility];  Service: Endoscopy;;  gastric   BIOPSY  11/27/2023   Procedure: BIOPSY;  Surgeon: Vinetta Greening, DO;  [facility];  Service: Endoscopy;;   CARDIOVERSION  2009   CARDIOVERSION N/A 08/29/2018   Procedure: CARDIOVERSION;  Surgeon: Laurann Pollock, MD;  [facility]: [facility];  Service: Endoscopy;  Laterality: N/A;    CARPAL TUNNEL RELEASE Bilateral    CESAREAN SECTION  1986, 1988, 1994   COLONOSCOPY N/A 02/15/2024   Procedure: COLONOSCOPY;  Surgeon: Vinetta Greening, DO;  [facility]: [facility];  Service: Endoscopy;  Laterality: N/A;  8:30 am, asa 3   COLONOSCOPY WITH PROPOFOL  N/A 01/26/2021   Procedure: COLONOSCOPY WITH PROPOFOL ;  Surgeon: Vinetta Greening, DO;  [facility];  Service: Endoscopy;  Laterality: N/A;  am appt, diabetic   ESOPHAGOGASTRODUODENOSCOPY (EGD) WITH PROPOFOL  N/A 01/26/2021   Procedure: ESOPHAGOGASTRODUODENOSCOPY (EGD) WITH PROPOFOL ;  Surgeon: Vinetta Greening, DO;  [facility];  Service: Endoscopy;  Laterality: N/A;   ESOPHAGOGASTRODUODENOSCOPY (EGD) WITH PROPOFOL  N/A 11/27/2023   Procedure: ESOPHAGOGASTRODUODENOSCOPY (EGD) WITH PROPOFOL ;  Surgeon: Vinetta Greening, DO;  [facility];  Service: Endoscopy;  Laterality: N/A;  9:00 am, asa 3   HALLUX VALGUS [place]USTIN Right 07/29/2022   Procedure: HALLUX VALGUS;  Surgeon: Floyce Hutching, DPM;  Location: [facility];  Service: Podiatry;  Laterality: Right;   HAMMER TOE SURGERY Right 07/29/2022   Procedure: HAMMER TOE CORRECTION 2ND;  Surgeon: Floyce Hutching, DPM;  Location: [facility];  Service: Podiatry;  Laterality: Right;   LEFT HEART CATH AND CORONARY ANGIOGRAPHY N/A 08/30/2019   Procedure: LEFT HEART CATH AND CORONARY ANGIOGRAPHY;  Surgeon: Lucendia Rusk, MD;  Location: [facility];  Service: Cardiovascular;  Laterality: N/A;   NASAL SINUS SURGERY  2017   PACEMAKER IMPLANT N/A 03/03/2022   Procedure: PACEMAKER IMPLANT;  Surgeon: Tammie Fall, MD;  Location: [facility];  Service: Cardiovascular;  Laterality: N/A;   POLYPECTOMY  01/26/2021   Procedure: POLYPECTOMY;  Surgeon: Vinetta Greening, DO;  Location: [facility];  Service: Endoscopy;;  colon   TOOTH EXTRACTION N/A 07/04/2023   Procedure: DENTAL RESTORATION/EXTRACTIONS;  Surgeon: Ascencion Lava, DMD;  Location: [facility];   Service: Oral Surgery;  Laterality: N/A;    OB History     Gravida  6   Para  3   Term      Preterm  3   AB  3   Living  3      SAB  3   IAB      Ectopic      Multiple      Live Births               Home Medications    Prior to Admission medications   Medication Sig Start Date End Date Taking? Authorizing Provider  chlorhexidine  (HIBICLENS ) 4 % external liquid Apply topically daily as needed. 03/05/24  Yes Corbin Dess, PA-C  mupirocin ointment (BACTROBAN) 2 % Apply 1 Application topically 2 (two) times daily. 03/05/24  Yes Corbin Dess, PA-C  apixaban  (ELIQUIS ) 5 MG TABS tablet TAKE 1 TABLET BY MOUTH TWICE  DAILY 01/01/24   Laurann Pollock, MD  atorvastatin  (LIPITOR) 20 MG tablet Take 20 mg by mouth at bedtime. 12/26/22   [provider]  Azelastine  HCl 137 MCG/SPRAY SOLN Place 1 spray into both nostrils daily. Patient taking differently: Place 1 spray into both nostrils daily as needed. 10/25/23   Rochester Chuck, MD  BD PEN NEEDLE NANO 2ND GEN 32G X 4 MM MISC 1 each by Other route in the morning, at noon, in the evening, and at bedtime. 05/16/22   Nida, Gebreselassie W, MD  bisoprolol  (ZEBETA ) 10 MG tablet Take 1 tablet (10 mg total) by mouth daily. 08/16/23   Laurann Pollock, MD  blood glucose meter kit and supplies 1 each by Other route 4 (four) times daily. Dispense based on patient and insurance preference. Use up to four times daily as directed. (FOR ICD-10 E10.9, E11.9). 01/27/21   Nida, Gebreselassie W, MD  budesonide  (PULMICORT ) 0.5 MG/2ML nebulizer solution Take 0.5 mg via nebulizer twice a day for the next 1-2 weeks or until cough and wheeze free, then stop Patient taking differently: Take 0.5 mg by nebulization daily as needed. Take 0.5 mg via nebulizer twice a day for the next 1-2 weeks or until cough and wheeze free, then stop 01/24/24   Ambs, Jeanmarie Millet, FNP  Cholecalciferol (VITAMIN D3) 125 MCG (5000 UT) CAPS Take 1  capsule (5,000 Units total) by mouth daily. 11/25/20   Nida, Gebreselassie W, MD  Continuous Glucose Sensor (FREESTYLE  LIBRE 3 SENSOR) MISC 1 Piece by Does not apply route every 14 (fourteen) days. Place 1 sensor on the skin every 14 days. Use to check glucose continuously 04/07/23   Nida, Gebreselassie W, MD  diltiazem  (CARDIZEM  CD) 300 MG 24 hr capsule Take 1 capsule (300 mg total) by mouth daily. 08/16/23   Laurann Pollock, MD  divalproex  (DEPAKOTE  ER) 500 MG 24 hr tablet Take 1 tablet (500 mg total) by mouth daily AND 2 tablets (1,000 mg total) at bedtime. 02/10/24 05/10/24  Todd Fossa, MD  doxycycline  (VIBRAMYCIN ) 100 MG capsule Take 100 mg by mouth 2 (two) times daily. Patient not taking: Reported on 02/18/2024 02/09/24   [provider]  ELDERBERRY PO Take 4 g by mouth daily. Gummies 2g each    [provider]  empagliflozin  (JARDIANCE ) 10 MG TABS tablet TAKE 1 TABLET BY MOUTH DAILY  BEFORE BREAKFAST 01/16/24   Nida, Gebreselassie W, MD  EPINEPHrine  0.3 mg/0.3 mL IJ SOAJ injection Inject 0.3 mg into the muscle once as needed for anaphylaxis. 01/17/20   [provider]  FERROUS SULFATE  PO Take 65 mg by mouth every evening.    [provider]  fluticasone  furoate-vilanterol (BREO ELLIPTA ) 100-25 MCG/ACT AEPB Inhale 1 puff into the lungs daily. 10/25/23   Rochester Chuck, MD  gabapentin  (NEURONTIN ) 600 MG tablet Take 600 mg by mouth 3 (three) times daily. 07/20/21   [provider]  HYDROcodone -acetaminophen  (NORCO/VICODIN) 5-325 MG tablet Take 1 tablet by mouth every 6 (six) hours as needed. 02/18/24   Cheyenne Cotta, MD  hydrOXYzine  (ATARAX ) 25 MG tablet TAKE ONE TABLET BY MOUTH THREE TIMES DAILY AS NEEDED FOR ANXIETY OR FOR ITCHING 03/10/23   Rochester Chuck, MD  Immune Globulin , Human, (CUVITRU  ) Inject 27 mg into the skin every 14 (fourteen) days. 12/24/21   [provider]  insulin  glargine (LANTUS  SOLOSTAR) 100 UNIT/ML Solostar Pen  Inject 30 Units into the skin at bedtime. 02/19/24   Nida, Gebreselassie W, MD  Insulin  Pen Needle (PEN NEEDLES 3/16") 31G X 5 MM MISC Use as directed with insulin  pen 10/21/21   Nida, Gebreselassie W, MD  lansoprazole  (PREVACID ) 30 MG capsule Take 1 capsule (30 mg total) by mouth 2 (two) times daily before a meal. 01/15/24   Lanney Pitts, PA-C  levalbuterol  (XOPENEX  HFA) 45 MCG/ACT inhaler USE 2 INHALATIONS BY MOUTH EVERY 6 HOURS AS NEEDED FOR WHEEZING 11/14/23   Rochester Chuck, MD  levalbuterol  (XOPENEX ) 1.25 MG/3ML nebulizer solution Use 1 vial in the nebulizer every 6 hours as needed for cough, wheeze, shortness of breath or chest tightness. 11/03/23   Rochester Chuck, MD  levothyroxine  (SYNTHROID , LEVOTHROID) 112 MCG tablet Take 112 mcg by mouth daily before breakfast.    [provider]  linaclotide  (LINZESS ) 290 MCG CAPS capsule Take 1 capsule (290 mcg total) by mouth daily before breakfast. 10/24/23   Lanney Pitts, PA-C  losartan  (COZAAR ) 50 MG tablet Take 50 mg by mouth every morning.    [provider]  MELATONIN GUMMIES PO Take 10 mg by mouth at bedtime.    [provider]  metFORMIN  (GLUCOPHAGE ) 500 MG tablet Take 500 mg by mouth 2 (two) times daily. 07/21/21   [provider]  montelukast  (SINGULAIR ) 10 MG tablet TAKE 1 TABLET BY MOUTH AT  BEDTIME 01/01/24   Rochester Chuck, MD  Multiple Vitamins-Minerals (HAIR SKIN & NAILS PO) Take 1 tablet by mouth daily.    [provider]  Multiple Vitamins-Minerals (MULTIVITAMIN WITH MINERALS) tablet Take 1 tablet by mouth daily. Woman    [provider]  Nebulizer MISC 1 Device by Other route as directed. Nebulizer tubing kit 10/25/23   Rochester Chuck, MD  ondansetron  (ZOFRAN -ODT) 8 MG disintegrating tablet Take 8 mg by mouth daily as needed for vomiting or nausea.    [provider]  [facility] ULTRA test strip 1 each by Other route in the morning, at noon, in the  evening, and at bedtime. 10/17/19   [provider]  polyethylene glycol (MIRALAX  / GLYCOLAX ) 17 g packet Take 17 g by mouth daily as needed. Patient taking differently: Take 17 g by mouth daily as needed for moderate constipation or severe constipation. 04/17/21   [place], Rema Care, DO  Potassium Chloride  ER 20 MEQ TBCR Take 20 mEq by mouth daily. 01/04/21   [provider]  sertraline  (ZOLOFT ) 50 MG tablet Take 1 tablet (50 mg total) by mouth at bedtime. 01/22/24 04/21/24  Todd Fossa, MD  Spacer/Aero-Holding Chambers DEVI 1 Device by Does not apply route as directed. 10/25/23   Rochester Chuck, MD  Tezepelumab-ekko (TEZSPIRE) 210 MG/1. SOAJ Inject 210 mg into the skin every 28 (twenty-eight) days. 02/28/24   Ambs, Jeanmarie Millet, FNP  topiramate  (TOPAMAX ) 100 MG tablet TAKE 2  TABLETS BY MOUTH  IN THE MORNING AND 1 TABLET IN  THE EVENING 01/29/24   Wertman, Sara E, PA-C  TRULICITY  4.5 MG/0.5ML SOAJ INJECT THE CONTENTS OF ONE PEN  SUBCUTANEOUSLY WEEKLY AS  DIRECTED Patient taking differently: Inject 4.5 mg into the skin every Sunday. 01/04/24   Baby Bolt, MD    Family History Family History  Problem Relation Age of Onset   Hypertension Mother    Stroke Mother    Kidney disease Mother    Heart attack Mother    Alcohol abuse Mother    Colon cancer Father        colon cancer   Kidney disease Sister    Hypertension Sister    Bipolar disorder Sister    Atrial fibrillation Sister    Heart disease Brother    Prostate cancer Brother    Thyroid  disease Daughter    Hashimoto's thyroiditis Daughter    Celiac disease Daughter    Allergic rhinitis Daughter    Stroke Maternal Aunt    Heart disease Maternal Aunt    Asthma Neg Hx     Social History Social History   Tobacco Use   Smoking status: Former    Current packs/day: 0.00    Average packs/day: 1 pack/day for 28.0 years (28.0 ttl pk-yrs)    Types: Cigarettes    Start date: 63    Quit date: 2008    Years  since quitting: 17.4   Smokeless tobacco: Never  Vaping Use   Vaping status: Never Used  Substance Use Topics   Alcohol use: Not Currently   Drug use: Not Currently     Allergies   Ativan [lorazepam] and Phenergan [promethazine hcl]   Review of Systems Review of Systems Per HPI  Physical Exam Triage Vital Signs ED Triage Vitals  Encounter Vitals Group     BP 03/05/24 1356 (!) 180/98     Systolic BP Percentile --      Diastolic BP Percentile --      Pulse Rate 03/05/24 1356 75     Resp 03/05/24 1356 20     Temp 03/05/24 1356 98 F (36.7 C)  Temp Source 03/05/24 1356 Oral     SpO2 03/05/24 1356 98 %     Weight --      Height --      Head Circumference --      Peak Flow --      Pain Score 03/05/24 1358 0     Pain Loc --      Pain Education --      Exclude from Growth Chart --    No data found.  Updated Vital Signs BP (!) 180/98 (BP Location: Right Arm)   Pulse 75   Temp 98 F (36.7 C) (Oral)   Resp 20   SpO2 98%   Visual Acuity Right Eye Distance:   Left Eye Distance:   Bilateral Distance:    Right Eye Near:   Left Eye Near:    Bilateral Near:     Physical Exam Vitals and nursing note reviewed.  Constitutional:      Appearance: Normal appearance. She is not ill-appearing.  HENT:     Head: Atraumatic.  Eyes:     Extraocular Movements: Extraocular movements intact.     Conjunctiva/sclera: Conjunctivae normal.  Cardiovascular:     Rate and Rhythm: Normal rate.  Pulmonary:     Effort: Pulmonary effort is normal.  Musculoskeletal:        General: Normal range of motion.     Cervical back: Normal range of motion and neck supple.  Skin:    General: Skin is warm.     Comments: Multiple scabbed and ulcerated lesions to extremities, neck, back where ticks have been removed.  No remaining portions of ticks appreciated on any of the sites and no new takes observed attached  Neurological:     Mental Status: She is alert and oriented to person, place,  and time.     Motor: No weakness.     Gait: Gait normal.     Comments: All 4 extremities neurovascularly intact  Psychiatric:        Mood and Affect: Mood normal.        Thought Content: Thought content normal.        Judgment: Judgment normal.      UC Treatments / Results  Labs (all labs ordered are listed, but only abnormal results are displayed) Labs Reviewed - No data to display  EKG   Radiology No results found.  Procedures Procedures (including critical care time)  Medications Ordered in UC Medications - No data to display  Initial Impression / Assessment and Plan / UC Course  I have reviewed the triage vital signs and the nursing notes.  Pertinent labs & imaging results that were available during my care of the patient were reviewed by me and considered in my medical decision making (see chart for details).     Discussed at length that there were no remaining portions of tics at any of the sites and I did not find any others attached to her at this time.  Wound care performed with dressings placed and discussed Hibiclens , mupirocin, dressings at home.  Return precautions reviewed. Final Clinical Impressions(s) / UC Diagnoses   Final diagnoses:  Skin lesion  Tick bite, unspecified site, initial encounter     Discharge Instructions      Clean the areas at least once a day with Hibiclens  and apply mupirocin and a bandage until healed.  ED Prescriptions     Medication Sig Dispense Auth. Provider   chlorhexidine  (HIBICLENS ) 4 % external liquid Apply  topically daily as needed. 236 mL Corbin Dess, PA-C   mupirocin ointment (BACTROBAN) 2 % Apply 1 Application topically 2 (two) times daily. 60 g Corbin Dess, [place]      PDMP not reviewed this encounter.   Corbin Dess, [place] 03/05/24 1640

## 2024-03-15 DIAGNOSIS — D839 Common variable immunodeficiency, unspecified: Secondary | ICD-10-CM | POA: Diagnosis not present

## 2024-03-17 ENCOUNTER — Other Ambulatory Visit: Payer: Self-pay | Admitting: Psychiatry

## 2024-03-17 DIAGNOSIS — F4310 Post-traumatic stress disorder, unspecified: Secondary | ICD-10-CM

## 2024-03-18 ENCOUNTER — Encounter: Payer: Self-pay | Admitting: "Endocrinology

## 2024-03-18 ENCOUNTER — Encounter: Attending: Internal Medicine | Admitting: Nutrition

## 2024-03-18 ENCOUNTER — Ambulatory Visit (INDEPENDENT_AMBULATORY_CARE_PROVIDER_SITE_OTHER): Payer: 59 | Admitting: "Endocrinology

## 2024-03-18 ENCOUNTER — Encounter: Payer: Self-pay | Admitting: Nutrition

## 2024-03-18 VITALS — BP 132/60 | HR 72 | Resp 12 | Ht 64.0 in | Wt 179.0 lb

## 2024-03-18 VITALS — Ht 64.0 in | Wt 179.0 lb

## 2024-03-18 DIAGNOSIS — R630 Anorexia: Secondary | ICD-10-CM | POA: Insufficient documentation

## 2024-03-18 DIAGNOSIS — I10 Essential (primary) hypertension: Secondary | ICD-10-CM | POA: Insufficient documentation

## 2024-03-18 DIAGNOSIS — E039 Hypothyroidism, unspecified: Secondary | ICD-10-CM

## 2024-03-18 DIAGNOSIS — E119 Type 2 diabetes mellitus without complications: Secondary | ICD-10-CM | POA: Insufficient documentation

## 2024-03-18 DIAGNOSIS — E782 Mixed hyperlipidemia: Secondary | ICD-10-CM

## 2024-03-18 DIAGNOSIS — E669 Obesity, unspecified: Secondary | ICD-10-CM | POA: Diagnosis present

## 2024-03-18 DIAGNOSIS — Z794 Long term (current) use of insulin: Secondary | ICD-10-CM | POA: Diagnosis not present

## 2024-03-18 DIAGNOSIS — E559 Vitamin D deficiency, unspecified: Secondary | ICD-10-CM | POA: Diagnosis not present

## 2024-03-18 DIAGNOSIS — F5002 Anorexia nervosa, binge eating/purging type, mild: Secondary | ICD-10-CM | POA: Insufficient documentation

## 2024-03-18 DIAGNOSIS — E1165 Type 2 diabetes mellitus with hyperglycemia: Secondary | ICD-10-CM

## 2024-03-18 LAB — POCT GLYCOSYLATED HEMOGLOBIN (HGB A1C): HbA1c, POC (controlled diabetic range): 6 % (ref 0.0–7.0)

## 2024-03-18 MED ORDER — LANTUS SOLOSTAR 100 UNIT/ML ~~LOC~~ SOPN: 20.0000 [IU] | PEN_INJECTOR | Freq: Every day | SUBCUTANEOUS | 1 refills | Status: DC

## 2024-03-18 NOTE — Progress Notes (Signed)
 Medical Nutrition Therapy  Appointment Start time:  [phone] Appointment End time:1029 Nutrition and DM  Follow up Lost 4 more lbs. Eating the outdoors in her garden and yard. BS this was 58 mgdl. Date some apple with PB, 1/2 banana. Saw Dr. Monte Antonio today. A1c 6%, down from 7.6%. Libre  TIR 95%. 2% low blood sugars from skipping meals and only eating 1 meal per day. Doesn't get hungry til 4 pm in afternoon. Lantus  to be reduced to 20 units a day. Still on Jardiance  and Trulicity  and Metformin  500 mg BID. Doesn't want to stop taking Trulicity  for fear of gaining weight back. IS having some low blood sugars. Only eating 1 meal per day at times. Has foods to help reverse hypoglycemia available in her purse at all times. Denies any purging activity. She hasn't meet with her therapist in a while and willing to schedule appointment face to face to address her disordered eating.  24 hr recall  9 am latte- Dinner [place] sandwich with water  Diet is insuffient to meet her nutritional and needs. She wants to get back to exercising but is so tired due to insuffient nutrient intake.  Goals set previously: Breakfast 1 egg, 1 slice avacado toast and a piece of fruit.- not done Lunch: 12-2: Sweetpotato, celery, 1/2 c lentils- not done D)Fish, baked potato, steam veggie, fruit,- eating tunafish and chicken salad sandwiches. Drink water Dont' skip meals.-still struggles with this due to loss of appetite. I will refer you to our dietitian that specializes in eating disorder to help you. The Dietitian can refer to you a therapist that specializes in disordered eating as you work with her.- Done. Didn't find it helpful. Increase protein rich foods of dried beans, peas, lentils, whole gains.    Latest Ref Rng & Units 02/18/2024    9:08 AM 01/18/2024    9:41 AM 11/15/2023   10:40 AM  CMP  Glucose 70 - 99 mg/dL 960  454  098   BUN 8 - 23 mg/dL 19  14  18    Creatinine 0.44 - 1.00 mg/dL 1.19  1.47  8.29   Sodium  135 - 145 mmol/L 137  139  140   Potassium 3.5 - 5.1 mmol/L 4.3  4.3  4.3   Chloride 98 - 111 mmol/L 110  110  110   CO2 22 - 32 mmol/L 21  19  20    Calcium  8.9 - 10.3 mg/dL 8.9  9.7  9.0   Total Protein 6.5 - 8.1 g/dL 7.0  7.5  7.3   Total Bilirubin 0.0 - 1.2 mg/dL 0.5  0.4  0.4   Alkaline Phos 38 - 126 U/L 62  61  55   AST 15 - 41 U/L 16  18  17    ALT 0 - 44 U/L 14  17  16     Lipid Panel     Component Value Date/Time   CHOL 147 11/15/2023 1040   CHOL 157 03/03/2023 1121   TRIG 147 11/15/2023 1040   HDL 42 11/15/2023 1040   HDL 44 03/03/2023 1121   CHOLHDL 3.5 11/15/2023 1040   VLDL 29 11/15/2023 1040   LDLCALC 76 11/15/2023 1040   LDLCALC 92 03/03/2023 1121   LABVLDL 21 03/03/2023 1121     Anthropometrics  Wt Readings from Last 3 Encounters:  03/18/24 179 lb (81.2 kg)  02/18/24 183 lb (83 kg)  02/15/24 183 lb (83 kg)   Ht Readings from Last 3 Encounters:  03/18/24 5'  4" (1.626 m)  02/18/24 5\' 4"  (1.626 m)  02/13/24 5\' 4"  (1.626 m)   There is no height or weight on file to calculate BMI. @BMIFA @ Facility age limit for growth %iles is 20 years. Facility age limit for growth %iles is 20 years. Clinical Medical Hx: CHF and DM,  Medications: Lantus  50 units a day now, Trulicity  weekly and Metformin .  Labs:  Lab Results  Component Value Date   HGBA1C 6.0 03/18/2024      Latest Ref Rng & Units 02/18/2024    9:08 AM 01/18/2024    9:41 AM 11/15/2023   10:40 AM  CMP  Glucose 70 - 99 mg/dL 130  865  784   BUN 8 - 23 mg/dL 19  14  18    Creatinine 0.44 - 1.00 mg/dL 6.96  2.95  2.84   Sodium 135 - 145 mmol/L 137  139  140   Potassium 3.5 - 5.1 mmol/L 4.3  4.3  4.3   Chloride 98 - 111 mmol/L 110  110  110   CO2 22 - 32 mmol/L 21  19  20    Calcium  8.9 - 10.3 mg/dL 8.9  9.7  9.0   Total Protein 6.5 - 8.1 g/dL 7.0  7.5  7.3   Total Bilirubin 0.0 - 1.2 mg/dL 0.5  0.4  0.4   Alkaline Phos 38 - 126 U/L 62  61  55   AST 15 - 41 U/L 16  18  17    ALT 0 - 44 U/L 14  17  16      Notable Signs/Symptoms:    Lifestyle & Dietary  Lives with her sister. Her sister is trying to help her learn how to cook healthier foods and meals. Estimated daily fluid intake:  64 oz  Working on eating meals on time.   Sleep: varies  Stress / self-care: stress eater Current average weekly physical activity: ADL     Estimated Energy Needs Calories: 1200 Carbohydrate: 135g Protein: 90g Fat: 33g   NUTRITION DIAGNOSIS  NB-1.1 Food and nutrition-related knowledge deficit As related to Diabetes Type 2.  As evidenced by A1C 7.7%.   NUTRITION INTERVENTION  Nutrition education (E-1) on the following topics:  Lifestyle Medicine - Whole Food, Plant Predominant Nutrition is highly recommended: Eat Plenty of vegetables, Mushrooms, fruits, Legumes, Whole Grains, Nuts, seeds in lieu of processed meats, processed snacks/pastries red meat, poultry, eggs.    -It is better to avoid simple carbohydrates including: Cakes, Sweet Desserts, Ice Cream, Soda (diet and regular), Sweet Tea, Candies, Chips, Cookies, Store Bought Juices, Alcohol in Excess of  1-2 drinks a day, Lemonade,  Artificial Sweeteners, Doughnuts, Coffee Creamers, "Sugar-free" Products, etc, etc.  This is not a complete list.....  Exercise: If you are able: 30 -60 minutes a day ,4 days a week, or 150 minutes a week.  The longer the better.  Combine stretch, strength, and aerobic activities.  If you were told in the past that you have high risk for cardiovascular diseases, you may seek evaluation by your heart doctor prior to initiating moderate to intense exercise programs.  Handouts Provided Include  .Nutrition Lifestyle handout  Learning Style & Readiness for Change Teaching method utilized: Visual & Auditory  Demonstrated degree of understanding via: Teach Back  Barriers to learning/adherence to lifestyle change: none  Goals Established by Pt   Set up an appt with therapist for face to face visit Eat 3 meals per day    MONITORING & EVALUATION Dietary intake, weekly  physical activity, and blood sugars in 3 month.   Next Steps  Get back on track with meal planning and eating meals on time-more plant based foods.

## 2024-03-18 NOTE — Patient Instructions (Signed)

## 2024-03-18 NOTE — Progress Notes (Signed)
 03/18/2024, 9:51 AM   Endocrinology follow-up note   Subjective:    Patient ID: Laura [name], female    DOB: [birth year].  Laura [name] was seen since 2019 for management of diabetes, also seen during her last visit in relation to her steroid exposure to rule out adrenal insufficiency.  PMD: [name], MD.   Past Medical History:  Diagnosis Date   Anemia    Aortic atherosclerosis (HCC)    Asthma    Atrial fibrillation and flutter (HCC)    a.) CHA2DS2VASc = 6 (sex, HTN, TIA x2, vascular disease history, T2DM);  b.) DCCV ~ 2009; c.) DCCV 08/29/2018 (200J x1 --> SR with 1st degree AVB); d.) rate/rhythm maintained on oral diltiazem  + bisoprolol ; chronically anticoagulated with apixaban    Bipolar disorder (HCC)    Carpal tunnel syndrome of right wrist    CHB (complete heart block) (HCC)    a.) s/p St. Jude dual chamber PPM placement   CHF (congestive heart failure) (HCC)    Complication of anesthesia    a.) "allergic reaction" to perioperative analgesics + anesthetic medications in [place] ; unsure of combination; not able to recount reactions/events --> states "I ended up in the ICU for 5 days"   Constipation    a.) on linaclotide    COPD (chronic obstructive pulmonary disease) (HCC)    DDD (degenerative disc disease), thoracic    GERD (gastroesophageal reflux disease)    H/O congenital atrial septal defect (ASD) repair    Hallux valgus of right foot    Hammertoe of second toe of right foot    History of 2019 novel coronavirus disease (COVID-19)    a.) 10/2020; b.) 05/2021   HLD (hyperlipidemia)    HTN (hypertension)    Hypogammaglobulinemia (HCC)    a.) on Curitru   Hypothyroid    IgE deficiency (HCC)    Long term current use of anticoagulant    a.) apixaban    Lumbar stenosis    Migraines    Mitral stenosis 02/08/2022   a.) TTE 02/08/2022: EF 60-65%, mild MAC, mild MV stenosis (peak grad  14.6 / mean grad 4.0)   Neuropathy    NSVT (nonsustained ventricular tachycardia) (HCC) 10/31/2018   a.) Holter 10/31/2018: 6 beat run   Obesity    OSA on CPAP    Osteoarthritis    Presence of permanent cardiac 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 Jude dual chamber device; placed for symptomatic bradycardia secondary to intermittent CHB   PTSD (post-traumatic stress disorder)    Pulmonary nodules    Recurrent sinusitis    Short-term memory loss    Sleep difficulties    a.) takes melatonin   Symptomatic bradycardia    a.) s/p St. Jude dual chamber PPM placement   Syncope    TIA (transient ischemic attack)    x3-last one in 2015   Tremors of nervous system    Type 2 diabetes mellitus (HCC)    Past Surgical History:  Procedure Laterality Date   ASD REPAIR  1968   BALLOON DILATION N/A 11/27/2023   Procedure: BALLOON DILATION;  Surgeon: Vinetta Greening, DO;  [facility]: [facility];  Service: Endoscopy;  Laterality: N/A;  9:00 am, asa 3   BIOPSY  01/26/2021   Procedure: BIOPSY;  Surgeon: Vinetta Greening, DO;  Location: [facility];  Service: Endoscopy;;  gastric   BIOPSY  11/27/2023   Procedure: BIOPSY;  Surgeon: Vinetta Greening, DO;  [facility];  Service: Endoscopy;;   CARDIOVERSION  2009   CARDIOVERSION N/A 08/29/2018   Procedure: CARDIOVERSION;  Surgeon: Laurann Pollock, MD;  [facility]: [facility];  Service: Endoscopy;  Laterality: N/A;   CARPAL TUNNEL RELEASE Bilateral    CESAREAN SECTION  1986, 1988, 1994   COLONOSCOPY N/A 02/15/2024   Procedure: COLONOSCOPY;  Surgeon: Vinetta Greening, DO;  [facility]: [facility];  Service: Endoscopy;  Laterality: N/A;  8:30 am, asa 3   COLONOSCOPY WITH PROPOFOL  N/A 01/26/2021   Procedure: COLONOSCOPY WITH PROPOFOL ;  Surgeon: Vinetta Greening, DO;  [facility];  Service: Endoscopy;  Laterality: N/A;  am appt, diabetic   ESOPHAGOGASTRODUODENOSCOPY (EGD) WITH PROPOFOL  N/A 01/26/2021   Procedure:  ESOPHAGOGASTRODUODENOSCOPY (EGD) WITH PROPOFOL ;  Surgeon: Vinetta Greening, DO;  [facility];  Service: Endoscopy;  Laterality: N/A;   ESOPHAGOGASTRODUODENOSCOPY (EGD) WITH PROPOFOL  N/A 11/27/2023   Procedure: ESOPHAGOGASTRODUODENOSCOPY (EGD) WITH PROPOFOL ;  Surgeon: Vinetta Greening, DO;  [facility];  Service: Endoscopy;  Laterality: N/A;  9:00 am, asa 3   HALLUX VALGUS [place]USTIN Right 07/29/2022   Procedure: HALLUX VALGUS;  Surgeon: Floyce Hutching, DPM;  Location: [facility];  Service: Podiatry;  Laterality: Right;   HAMMER TOE SURGERY Right 07/29/2022   Procedure: HAMMER TOE CORRECTION 2ND;  Surgeon: Floyce Hutching, DPM;  Location: [facility];  Service: Podiatry;  Laterality: Right;   LEFT HEART CATH AND CORONARY ANGIOGRAPHY N/A 08/30/2019   Procedure: LEFT HEART CATH AND CORONARY ANGIOGRAPHY;  Surgeon: Lucendia Rusk, MD;  Location: [facility] INV[facility] CV LAB;  Service: Cardiovascular;  Laterality: N/A;   NASAL SINUS SURGERY  2017   PACEMAKER IMPLANT N/A 03/03/2022   Procedure: PACEMAKER IMPLANT;  Surgeon: Tammie Fall, MD;  Location: [facility]AS[facility] CV LAB;  Service: Cardiovascular;  Laterality: N/A;   POLYPECTOMY  01/26/2021   Procedure: POLYPECTOMY;  Surgeon: Vinetta Greening, DO;  [facility]: [facility];  Service: Endoscopy;;  colon   TOOTH EXTRACTION N/A 07/04/2023   Procedure: DENTAL RESTORATION/EXTRACTIONS;  Surgeon: Ascencion Lava, DMD;  Location: [facility];  Service: Oral Surgery;  Laterality: N/A;   Social History   Socioeconomic History   Marital status: Widowed    Spouse name: Not on file   Number of children: 3   Years of education: 10   Highest education level: Not on file  Occupational History   Not on file  Tobacco Use   Smoking status: Former    Current packs/day: 0.00    Average packs/day: 1 pack/day for 28.0 years (28.0 ttl pk-yrs)    Types: Cigarettes    Start date: 41    Quit date: 2008    Years since quitting: 17.4   Smokeless tobacco:  Never  Vaping Use   Vaping status: Never Used  Substance and Sexual Activity   Alcohol use: Not Currently   Drug use: Not Currently   Sexual activity: Not Currently    Birth control/protection: Post-menopausal  Other Topics Concern   Not on file  Social History Narrative   Right handed   Drinks caffeine   One story home   Social Drivers of Health   Financial Resource Strain:  High Risk (01/06/2022)   Overall Financial Resource Strain (CARDIA)    Difficulty of Paying Living Expenses: Hard  Food Insecurity: No Food Insecurity (01/18/2024)   Hunger Vital Sign    Worried About Running Out of Food in the Last Year: Never true    Ran Out of Food in the Last Year: Never true  Transportation Needs: No Transportation Needs (01/18/2024)   PRAPARE - Administrator, Civil Service (Medical): No    Lack of Transportation (Non-Medical): No  Physical Activity: Insufficiently Active (01/06/2022)   Exercise Vital Sign    Days of Exercise per Week: 4 days    Minutes of Exercise per Session: 30 min  Stress: No Stress Concern Present (01/06/2022)   [employer] of Occupational Health - Occupational Stress Questionnaire    Feeling of Stress : Only a little  Social Connections: Moderately Integrated (01/06/2022)   Social Connection and Isolation Panel [NHANES]    Frequency of Communication with Friends and Family: More than three times a week    Frequency of Social Gatherings with Friends and Family: More than three times a week    Attends Religious Services: More than 4 times per year    Active Member of [employer] or Organizations: Yes    Attends Banker Meetings: More than 4 times per year    Marital Status: Widowed   Outpatient Encounter Medications as of 03/18/2024  Medication Sig   apixaban  (ELIQUIS ) 5 MG TABS tablet TAKE 1 TABLET BY MOUTH TWICE  DAILY   atorvastatin  (LIPITOR) 20 MG tablet Take 20 mg by mouth at bedtime.   Azelastine  HCl 137 MCG/SPRAY SOLN Place 1 spray  into both nostrils daily. (Patient taking differently: Place 1 spray into both nostrils daily as needed.)   BD PEN NEEDLE NANO 2ND GEN 32G X 4 MM MISC 1 each by Other route in the morning, at noon, in the evening, and at bedtime.   bisoprolol  (ZEBETA ) 10 MG tablet Take 1 tablet (10 mg total) by mouth daily.   blood glucose meter kit and supplies 1 each by Other route 4 (four) times daily. Dispense based on patient and insurance preference. Use up to four times daily as directed. (FOR ICD-10 E10.9, E11.9).   budesonide  (PULMICORT ) 0.5 MG/2ML nebulizer solution Take 0.5 mg via nebulizer twice a day for the next 1-2 weeks or until cough and wheeze free, then stop (Patient taking differently: Take 0.5 mg by nebulization daily as needed. Take 0.5 mg via nebulizer twice a day for the next 1-2 weeks or until cough and wheeze free, then stop)   chlorhexidine  (HIBICLENS ) 4 % external liquid Apply topically daily as needed.   Cholecalciferol (VITAMIN D3) 125 MCG (5000 UT) CAPS Take 1 capsule (5,000 Units total) by mouth daily.   Continuous Glucose Sensor (FREESTYLE LIBRE 3 SENSOR) MISC 1 Piece by Does not apply route every 14 (fourteen) days. Place 1 sensor on the skin every 14 days. Use to check glucose continuously   diltiazem  (CARDIZEM  CD) 300 MG 24 hr capsule Take 1 capsule (300 mg total) by mouth daily.   divalproex  (DEPAKOTE  ER) 500 MG 24 hr tablet Take 1 tablet (500 mg total) by mouth daily AND 2 tablets (1,000 mg total) at bedtime.   ELDERBERRY PO Take 4 g by mouth daily. Gummies 2g each   empagliflozin  (JARDIANCE ) 10 MG TABS tablet TAKE 1 TABLET BY MOUTH DAILY  BEFORE BREAKFAST   EPINEPHrine  0.3 mg/0.3 mL IJ SOAJ injection Inject 0.3 mg  into the muscle once as needed for anaphylaxis.   FERROUS SULFATE  PO Take 65 mg by mouth every evening.   fluticasone  furoate-vilanterol (BREO ELLIPTA ) 100-25 MCG/ACT AEPB Inhale 1 puff into the lungs daily.   gabapentin  (NEURONTIN ) 600 MG tablet Take 600 mg by mouth 3  (three) times daily.   HYDROcodone -acetaminophen  (NORCO/VICODIN) 5-325 MG tablet Take 1 tablet by mouth every 6 (six) hours as needed.   hydrOXYzine  (ATARAX ) 25 MG tablet TAKE ONE TABLET BY MOUTH THREE TIMES DAILY AS NEEDED FOR ANXIETY OR FOR ITCHING   Immune Globulin , Human, (CUVITRU  [place]) Inject 27 mg into the skin every 14 (fourteen) days.   insulin  glargine (LANTUS  SOLOSTAR) 100 UNIT/ML Solostar Pen Inject 20 Units into the skin at bedtime.   Insulin  Pen Needle (PEN NEEDLES 3/16") 31G X 5 MM MISC Use as directed with insulin  pen   lansoprazole  (PREVACID ) 30 MG capsule Take 1 capsule (30 mg total) by mouth 2 (two) times daily before a meal.   levalbuterol  (XOPENEX  HFA) 45 MCG/ACT inhaler USE 2 INHALATIONS BY MOUTH EVERY 6 HOURS AS NEEDED FOR WHEEZING   levalbuterol  (XOPENEX ) 1.25 MG/3ML nebulizer solution Use 1 vial in the nebulizer every 6 hours as needed for cough, wheeze, shortness of breath or chest tightness.   levothyroxine  (SYNTHROID , LEVOTHROID) 112 MCG tablet Take 112 mcg by mouth daily before breakfast.   linaclotide  (LINZESS ) 290 MCG CAPS capsule Take 1 capsule (290 mcg total) by mouth daily before breakfast.   losartan  (COZAAR ) 50 MG tablet Take 50 mg by mouth every morning.   MELATONIN GUMMIES PO Take 10 mg by mouth at bedtime.   metFORMIN  (GLUCOPHAGE ) 500 MG tablet Take 500 mg by mouth 2 (two) times daily.   montelukast  (SINGULAIR ) 10 MG tablet TAKE 1 TABLET BY MOUTH AT  BEDTIME   Multiple Vitamins-Minerals (HAIR SKIN & NAILS PO) Take 1 tablet by mouth daily.   Multiple Vitamins-Minerals (MULTIVITAMIN WITH MINERALS) tablet Take 1 tablet by mouth daily. Woman   mupirocin  ointment (BACTROBAN ) 2 % Apply 1 Application topically 2 (two) times daily.   Nebulizer MISC 1 Device by Other route as directed. Nebulizer tubing kit   ondansetron  (ZOFRAN -ODT) 8 MG disintegrating tablet Take 8 mg by mouth daily as needed for vomiting or nausea.   ONETOUCH ULTRA test strip 1 each by Other route in  the morning, at noon, in the evening, and at bedtime.   polyethylene glycol (MIRALAX  / GLYCOLAX ) 17 g packet Take 17 g by mouth daily as needed. (Patient taking differently: Take 17 g by mouth daily as needed for moderate constipation or severe constipation.)   Potassium Chloride  ER 20 MEQ TBCR Take 20 mEq by mouth daily.   sertraline  (ZOLOFT ) 50 MG tablet Take 1 tablet (50 mg total) by mouth at bedtime.   Spacer/Aero-Holding Chambers DEVI 1 Device by Does not apply route as directed.   Tezepelumab -ekko (TEZSPIRE ) 210 MG/1. SOAJ Inject 210 mg into the skin every 28 (twenty-eight) days.   topiramate  (TOPAMAX ) 100 MG tablet TAKE 2  TABLETS BY MOUTH  IN THE MORNING AND 1 TABLET IN  THE EVENING   TRULICITY  4.5 MG/0.5ML SOAJ INJECT THE CONTENTS OF ONE PEN  SUBCUTANEOUSLY WEEKLY AS  DIRECTED (Patient taking differently: Inject 4.5 mg into the skin every Sunday.)   [DISCONTINUED] doxycycline  (VIBRAMYCIN ) 100 MG capsule Take 100 mg by mouth 2 (two) times daily. (Patient not taking: Reported on 02/18/2024)   [DISCONTINUED] insulin  glargine (LANTUS  SOLOSTAR) 100 UNIT/ML Solostar Pen Inject 30 Units into the  skin at bedtime.   No facility-administered encounter medications on file as of 03/18/2024.    ALLERGIES: Allergies  Allergen Reactions   Alpha-Gal    Ativan [Lorazepam] Hives   Phenergan [Promethazine Hcl] Hives    VACCINATION STATUS: Immunization History  Administered Date(s) Administered   Influenza Split 08/24/2018   Influenza Whole 07/15/2019   Influenza,inj,Quad PF,6+ Mos 07/10/2017, 10/27/2021   Moderna Sars-Covid-2 Vaccination 12/31/2019, 01/30/2020   Pneumococcal-Unspecified 10/10/2012    Diabetes She presents for her follow-up diabetic visit. She has type 2 diabetes mellitus. Onset time: She was diagnosed at approximate age of 31 years, stated by gestational diabetes with all 3 of her pregnancies in her 55s. Her disease course has been improving. There are no hypoglycemic  associated symptoms. Pertinent negatives for hypoglycemia include no confusion, headaches, pallor or seizures. Pertinent negatives for diabetes include no blurred vision, no chest pain, no fatigue, no polydipsia, no polyphagia and no polyuria. There are no hypoglycemic complications. Symptoms are worsening. Diabetic complications include a CVA and heart disease. (She has history of open heart surgery for ASD in 1965.) Risk factors for coronary artery disease include dyslipidemia, diabetes mellitus, family history, obesity, hypertension, post-menopausal, sedentary lifestyle and tobacco exposure. Current diabetic treatments: She is currently on Trulicity  4.5 mg subcutaneously weekly, metformin  500 mg p.o. twice daily, Jardiance  10 mg p.o. daily, and Lantus  20 units nightly. Her weight is decreasing steadily. She is following a generally unhealthy diet. When asked about meal planning, she reported none. She has not had a previous visit with a dietitian. She rarely (She has medical problem with bronchiectasis for which she gets treated with steroids on and off.) participates in exercise. Her home blood glucose trend is decreasing steadily. Her breakfast blood glucose range is generally 110-130 mg/dl. Her lunch blood glucose range is generally 110-130 mg/dl. Her dinner blood glucose range is generally 110-130 mg/dl. Her bedtime blood glucose range is generally 110-130 mg/dl. Her overall blood glucose range is 110-130 mg/dl. (Ms. Kirkendoll presents with her CGM device showing improving glycemic profile.  Her AGP report shows 95% time range, 3% 1 hyperglycemia.  She also has 2% level 1 hypoglycemia.  Her point-of-care A1c is 6%.  Her average blood glucose is 112 mg per DL for the last 14 days.      ) An ACE inhibitor/angiotensin II receptor blocker is being taken.  Hyperlipidemia This is a chronic problem. The current episode started more than 1 year ago. Exacerbating diseases include diabetes and obesity. Associated  symptoms include myalgias. Pertinent negatives include no chest pain or shortness of breath. Current antihyperlipidemic treatment includes statins. Risk factors for coronary artery disease include dyslipidemia, diabetes mellitus, family history, obesity, hypertension, a sedentary lifestyle and post-menopausal.  Hypertension This is a chronic problem. The current episode started more than 1 year ago. The problem is uncontrolled. Pertinent negatives include no blurred vision, chest pain, headaches, palpitations or shortness of breath. Risk factors for coronary artery disease include dyslipidemia, diabetes mellitus, obesity, sedentary lifestyle and smoking/tobacco exposure. Past treatments include ACE inhibitors. Hypertensive end-organ damage includes CVA.  Other The current episode started more than 1 month ago. The problem occurs constantly. Progression since onset: Patient reports that she did have exposure to steroids related to back pain on 2 separate occasions between November 2000 21 and 2022.  Last exposure was related to her COVID infection.  She reports that she developed fatigueced that she developed fatigue. Associated symptoms include myalgias. Pertinent negatives include no arthralgias, chest pain, chills, coughing,  fatigue, fever, headaches, nausea, rash or vomiting. She has tried nothing for the symptoms. The treatment provided no relief.     Objective:    BP 132/60   Pulse 72   Resp 12   Ht 5\' 4"  (1.626 m)   Wt 179 lb (81.2 kg)   BMI 30.73 kg/m   Wt Readings from Last 3 Encounters:  03/18/24 179 lb (81.2 kg)  02/18/24 183 lb (83 kg)  02/15/24 183 lb (83 kg)         Latest Ref Rng & Units 02/18/2024    9:08 AM 01/18/2024    9:41 AM 11/15/2023   10:40 AM  CMP  Glucose 70 - 99 mg/dL 161  096  045   BUN 8 - 23 mg/dL 19  14  18    Creatinine 0.44 - 1.00 mg/dL 4.09  8.11  9.14   Sodium 135 - 145 mmol/L 137  139  140   Potassium 3.5 - 5.1 mmol/L 4.3  4.3  4.3   Chloride 98 - 111  mmol/L 110  110  110   CO2 22 - 32 mmol/L 21  19  20    Calcium  8.9 - 10.3 mg/dL 8.9  9.7  9.0   Total Protein 6.5 - 8.1 g/dL 7.0  7.5  7.3   Total Bilirubin 0.0 - 1.2 mg/dL 0.5  0.4  0.4   Alkaline Phos 38 - 126 U/L 62  61  55   AST 15 - 41 U/L 16  18  17    ALT 0 - 44 U/L 14  17  16     Lipid Panel     Component Value Date/Time   CHOL 147 11/15/2023 1040   CHOL 157 03/03/2023 1121   TRIG 147 11/15/2023 1040   HDL 42 11/15/2023 1040   HDL 44 03/03/2023 1121   CHOLHDL 3.5 11/15/2023 1040   VLDL 29 11/15/2023 1040   LDLCALC 76 11/15/2023 1040   LDLCALC 92 03/03/2023 1121   LABVLDL 21 03/03/2023 1121    Latest Reference Range & Units 07/27/22 16:18 12/01/22 00:00 03/03/23 11:21  TSH 0.450 - 4.500 uIU/mL 1.83 1.84 (E) 1.180  T4,Free(Direct) 0.82 - 1.77 ng/dL   7.82  (E): External lab result   Assessment & Plan:  1. DM type 2 causing vascular disease (HCC)  Ms. Vicencio presents with her CGM device showing improving glycemic profile.  Her AGP report shows 95% time range, 3% 1 hyperglycemia.  She also has 2% level 1 hypoglycemia.  Her point-of-care A1c is 6%.  Her average blood glucose is 112 mg per DL for the last 14 days.    -her diabetes is complicated by CVA, CHF, history of open heart surgery for ASD, obesity/sedentary life, history of chronic smoking with bronchiectasis and Ziana Heyliger remains at a high risk for more acute and chronic complications which include CAD, CVA, CKD, retinopathy, and neuropathy. These are all discussed in detail with the patient.  - I have counseled her on diet management and weight loss, by adopting a carbohydrate restricted/protein rich diet.  - she acknowledges that there is a room for improvement in her food and drink choices. - Suggestion is made for her to avoid simple carbohydrates  from her diet including Cakes, Sweet Desserts, Ice Cream, Soda (diet and regular), Sweet Tea, Candies, Chips, Cookies, Store Bought Juices, Alcohol , Artificial  Sweeteners,  Coffee Creamer, and "Sugar-free" Products, Lemonade. This will help patient to have more stable blood glucose profile and potentially avoid unintended  weight gain.  The following Lifestyle Medicine recommendations according to American College of Lifestyle Medicine  [facility]) were discussed and and offered to patient and she  agrees to start the journey:  A. Whole Foods, Plant-Based Nutrition comprising of fruits and vegetables, plant-based proteins, whole-grain carbohydrates was discussed in detail with the patient.   A list for source of those nutrients were also provided to the patient.  Patient will use only water or unsweetened tea for hydration. B.  The need to stay away from risky substances including alcohol, smoking; obtaining 7 to 9 hours of restorative sleep, at least 150 minutes of moderate intensity exercise weekly, the importance of healthy social connections,  and stress management techniques were discussed. C.  A full color page of  Calorie density of various food groups per pound showing examples of each food groups was provided to the patient.   - she is advised to stick to a routine mealtimes to eat 3 meals  a day and avoid unnecessary snacks ( to snack only to correct hypoglycemia).   - she is following  with Penny Crumpton, RDN, CDE for individualized diabetes education.  - I have approached her with the following individualized plan to manage diabetes and patient agrees:   -She continues to respond to her current regimen.  She is advised to lower her Lantus  to 20 units nightly.  Advised to continue to utilize her CGM continuously.    She continues to tolerate Jardiance , advised to continue Jardiance  10 mg p.o. daily at breakfast.    -She is advised to continue Trulicity  4.5 mg subcutaneously weekly.  She is also on metformin  500 mg p.o. twice daily.  She is advised to call clinic for blood glucose readings above 200 mg per DL x3, or less than 70 mg per DL.     -  Patient specific target  A1c;  LDL, HDL, Triglycerides,  were discussed in detail.  2.  BP/HTN:  -Her blood pressure is controlled to target.  She advised to continue her current blood pressure medications including hydralazine  25 mg 3 times daily.    3) Lipids/HPL: Her most recent lipid panel showed LDL at 76-controlled.  She is advised to continue atorvastatin  10 mg p.o. nightly.   Side effects and precautions discussed with her .  4)  Weight/Diet: She presents with significant weight loss, BMI down to 30.73 from 34.05 .   She is a candidate for some more  weight loss.   The above discussed dietary plan will help achieve weight loss. -She is working with her dietitian.     5 hypothyroidism-long-standing, etiology unclear, she has multiple family members with thyroid  dysfunctions. Her recent thyroid  function tests were consistent with appropriate replacement.  She is advised to continue levothyroxine  112 mcg p.o. daily before breakfast    - We discussed about the correct intake of her thyroid  hormone, on empty stomach at fasting, with water, separated by at least 30 minutes from breakfast and other medications,  and separated by more than 4 hours from calcium , iron, multivitamins, acid reflux medications (PPIs). -Patient is made aware of the fact that thyroid  hormone replacement is needed for life, dose to be adjusted by periodic monitoring of thyroid  function tests.  6) vitamin D  deficiency: - She is currently on  vitamin D  supplements including vitamin D3 5000 units daily.     7) Chronic Care/Health Maintenance:  -she  is on  Statin medications and  is encouraged to continue to follow up  with Ophthalmology, Dentist,  Podiatrist at least yearly or according to recommendations, and advised to  stay away from smoking. I have recommended yearly flu vaccine and pneumonia vaccination at least every 5 years;  and  sleep for at least 7 hours a day.  - I advised patient to maintain close follow  up with [name], MD for primary care needs.  I spent  41  minutes in the care of the patient today including review of labs from CMP, Lipids, Thyroid  Function, Hematology (current and previous including abstractions from other facilities); face-to-face time discussing  her blood glucose readings/logs, discussing hypoglycemia and hyperglycemia episodes and symptoms, medications doses, her options of short and long term treatment based on the latest standards of care / guidelines;  discussion about incorporating lifestyle medicine;  and documenting the encounter. Risk reduction counseling performed per USPSTF guidelines to reduce  obesity and cardiovascular risk factors.     Please refer to Patient Instructions for Blood Glucose Monitoring and Insulin /Medications Dosing Guide"  in media tab for additional information. Please  also refer to " Patient Self Inventory" in the Media  tab for reviewed elements of pertinent patient history.  Cyndal Kasson participated in the discussions, expressed understanding, and voiced agreement with the above plans.  All questions were answered to her satisfaction. she is encouraged to contact clinic should she have any questions or concerns prior to her return visit.   Follow up plan: - Return in about 4 months (around 07/18/2024) for Bring Meter/CGM Device/Logs- A1c in Office.  Kalvin Orf, MD [facility] [address] [place], [place] [address] Phone: [phone]  Fax: [phone]    03/18/2024, 9:51 AM  This note was partially dictated with voice recognition software. Similar sounding words can be transcribed inadequately or may not  be corrected upon review.

## 2024-03-18 NOTE — Patient Instructions (Signed)
 Goals  Set up an appt with therapist for face to face visit Eat 3 meals per day

## 2024-03-20 ENCOUNTER — Other Ambulatory Visit: Payer: Self-pay

## 2024-03-20 DIAGNOSIS — S1096XD Insect bite of unspecified part of neck, subsequent encounter: Secondary | ICD-10-CM | POA: Diagnosis not present

## 2024-03-20 DIAGNOSIS — E1165 Type 2 diabetes mellitus with hyperglycemia: Secondary | ICD-10-CM

## 2024-03-20 DIAGNOSIS — Z7182 Exercise counseling: Secondary | ICD-10-CM | POA: Diagnosis not present

## 2024-03-20 DIAGNOSIS — E1169 Type 2 diabetes mellitus with other specified complication: Secondary | ICD-10-CM | POA: Diagnosis not present

## 2024-03-20 DIAGNOSIS — Z79899 Other long term (current) drug therapy: Secondary | ICD-10-CM | POA: Diagnosis not present

## 2024-03-20 DIAGNOSIS — I10 Essential (primary) hypertension: Secondary | ICD-10-CM | POA: Diagnosis not present

## 2024-03-20 DIAGNOSIS — S40861D Insect bite (nonvenomous) of right upper arm, subsequent encounter: Secondary | ICD-10-CM | POA: Diagnosis not present

## 2024-03-20 DIAGNOSIS — Z713 Dietary counseling and surveillance: Secondary | ICD-10-CM | POA: Diagnosis not present

## 2024-03-20 DIAGNOSIS — Z7984 Long term (current) use of oral hypoglycemic drugs: Secondary | ICD-10-CM | POA: Diagnosis not present

## 2024-03-20 DIAGNOSIS — W57XXXA Bitten or stung by nonvenomous insect and other nonvenomous arthropods, initial encounter: Secondary | ICD-10-CM | POA: Diagnosis not present

## 2024-03-20 DIAGNOSIS — S20469D Insect bite (nonvenomous) of unspecified back wall of thorax, subsequent encounter: Secondary | ICD-10-CM | POA: Diagnosis not present

## 2024-03-23 ENCOUNTER — Other Ambulatory Visit: Payer: Self-pay | Admitting: "Endocrinology

## 2024-03-26 ENCOUNTER — Ambulatory Visit (INDEPENDENT_AMBULATORY_CARE_PROVIDER_SITE_OTHER): Admitting: Clinical

## 2024-03-26 DIAGNOSIS — F4310 Post-traumatic stress disorder, unspecified: Secondary | ICD-10-CM | POA: Diagnosis not present

## 2024-03-26 NOTE — Progress Notes (Signed)
 Virtual Visit via Video Note   I connected with Laura [name] on 03/26/2024 at  11:00 AM EST by a video enabled telemedicine application and verified that I am speaking with the correct person using two identifiers.   Location: Patient: Home Provider: Office   I discussed the limitations of evaluation and management by telemedicine and the availability of in person appointments. The patient expressed understanding and agreed to proceed.   THERAPIST PROGRESS NOTE   Session Time: 11:00 AM-:11:55 AM   Participation Level: Active   Behavioral Response: CasualAlertDepressed   Type of Therapy: Individual Therapy   Treatment Goals addressed: Coping/ non-medication treatment therapy for PTSD.   Interventions: CBT, DBT, Solution Focused, Strength-based and Supportive   Summary: Laura [name] is a 63 y.o. female who presents with PTSD. The OPT therapist worked with the patient for her ongoing OPT treatment session. The OPT therapist utilized Motivational Interviewing to assist in creating therapeutic repore.The patient in the session was engaged and work in collaboration giving feedback about her triggers and symptoms over the past few weeks. The patient . The patient spoke about cancelling her trip to [place]  due to schedule conflict with another family member going out at the same time and there not being enough room for her to go with the daughter not having enough space to have more people stay. The patient spoke about re-planning her visit to go out to visit her daughter in July. The patient spoke about her most recent medication management visit with Dr. [name] on 02/14/2024 and there was no a change at that time with her medication just a continuation. 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 The OPT therapist utilized Cognitive Behavioral Therapy through cognitive restructuring as well as worked with the  patient on coping strategies to assist in management of mental health symptoms. The OPT therapist worked with the patient on consistency in getting back to her health baseline and working to be consistent with her management of her external stressors with consistency especially around her interactions with other family members who she has been in conflict with over the past few months that trigger the patients PTSD. The patient spoke about impact of recent family interactions including conflict with her brother who she feels should be more involved in helping out family members who he lives close to. The OPT therapist overviewed with the patient her basic health areas including sleep, eating, exercise, and hygiene. The patient has been utilizing her coping skills for Spring including going [employer], site.The patient spoke about her garden doing well and starting to produce.The OPT therapist placed emphasis on the patient keeping all upcoming health appointments and adhering to ongoing health treatment recommendations. The OPT therapist reviewed all appointments listed coming up in the patients MyChart    Suicidal/Homicidal: Nowithout intent/plan   Therapist Response:The OPT therapist worked with the patient for the patients scheduled session. The patient was engaged in her session and gave feedback in relation to triggers, symptoms, and behavior responses over the past few weeks. The patient spoke about cancelling her plans to go see her daughter in [place]  due to another family member going at the same time and there not being enough room at the daughters home. The OPT therapist worked with the patient utilizing an in session Cognitive Behavioral Therapy exercise. The OPT therapist worked with the patient on the impact of her interactions with her family members. The  OPT therapist continued to work with the patient on limiting her exposure to triggering interactions and putting her focus into her own  self health and care. The OPT therapist overviewed with the patient implementing coping strategies to help further manage her MH symptoms.The patient has been working with her physical health providers to manage physical health conditions. The patient spoke about her mood and interactions with family recently that she has good repore with.  The patient spoke about being upset with her brother for not being more involved in helping or interacting with family members who he lives much closer with than does the patient. The patient spoke about ongoing conflict with her middle daughter [name] and still not talking with her. The patient spoke about her ongoing interaction with a guy she is currently seeing [name] and the patient finding out that he is a substance user (crack cocaine) and the patient ultimately decided to block him.The patient spoke about her concern around her sister who lives in [place] . The patient spoke about her plans to review recent blood work results with Dr. [name].The OPT therapist worked with the patient overviewing upcoming appointments as listed in the patients Mychart. The OPT therapist will continue treatment work with the patient in her next scheduled session.   Plan: Return again in 2/3 weeks.   Diagnosis:      Axis I: Post Traumatic Stress Disorder                            Axis II: No diagnosis     Collaboration of Care: Overview with the patient of involvement in the Med Management program with Dr. [name].   Patient/Guardian was advised Release of Information must be obtained prior to any record release in order to collaborate their care with an outside provider. Patient/Guardian was advised if they have not already done so to contact the registration department to sign all necessary forms in order for us  to release information regarding their care.    Consent: Patient/Guardian gives verbal consent for treatment and assignment of benefits for services provided  during this visit. Patient/Guardian expressed understanding and agreed to proceed.      I discussed the assessment and treatment plan with the patient. The patient was provided an opportunity to ask questions and all were answered. The patient agreed with the plan and demonstrated an understanding of the instructions.   The patient was advised to call back or seek an in-person evaluation if the symptoms worsen or if the condition fails to improve as anticipated.   I provided 55 minutes of non-face-to-face time during this encounter.   Secundino Dach, LCSW    03/26/2024

## 2024-03-29 DIAGNOSIS — D839 Common variable immunodeficiency, unspecified: Secondary | ICD-10-CM | POA: Diagnosis not present

## 2024-03-29 NOTE — Progress Notes (Unsigned)
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 AM

## 2024-03-30 DIAGNOSIS — T1490XA Injury, unspecified, initial encounter: Secondary | ICD-10-CM | POA: Diagnosis not present

## 2024-03-30 DIAGNOSIS — Z743 Need for continuous supervision: Secondary | ICD-10-CM | POA: Diagnosis not present

## 2024-03-30 DIAGNOSIS — S3992XA Unspecified injury of lower back, initial encounter: Secondary | ICD-10-CM | POA: Diagnosis not present

## 2024-03-30 DIAGNOSIS — W108XXA Fall (on) (from) other stairs and steps, initial encounter: Secondary | ICD-10-CM | POA: Diagnosis not present

## 2024-03-30 DIAGNOSIS — S299XXA Unspecified injury of thorax, initial encounter: Secondary | ICD-10-CM | POA: Diagnosis not present

## 2024-03-30 DIAGNOSIS — S3991XA Unspecified injury of abdomen, initial encounter: Secondary | ICD-10-CM | POA: Diagnosis not present

## 2024-03-31 ENCOUNTER — Other Ambulatory Visit: Payer: Self-pay | Admitting: Psychiatry

## 2024-03-31 DIAGNOSIS — F4310 Post-traumatic stress disorder, unspecified: Secondary | ICD-10-CM

## 2024-04-01 ENCOUNTER — Ambulatory Visit (INDEPENDENT_AMBULATORY_CARE_PROVIDER_SITE_OTHER)

## 2024-04-01 DIAGNOSIS — J4550 Severe persistent asthma, uncomplicated: Secondary | ICD-10-CM

## 2024-04-01 MED ORDER — TEZEPELUMAB-EKKO 210 MG/1.91ML ~~LOC~~ SOSY
210.0000 mg | PREFILLED_SYRINGE | SUBCUTANEOUS | Status: AC
  Administered 2024-04-01: 210 mg via SUBCUTANEOUS

## 2024-04-01 NOTE — Progress Notes (Signed)
 Immunotherapy   Patient Details  Name: Laura [name] MRN: [id number] Date of Birth: 07-18-1961  04/01/2024  Laura [name] started injections for  Tezspire  home administration. Following schedule: Every twenty eight days. Frequency:Every four weeks. Epi-Pen:Not needed. Consent signed in office today and patient instructions given on how to properly inject herself with Tezspire  in her thighs and abdomen. Patient stated that she does infusions at home so she was aware of the places to inject and the techniques. Patient successfully injected herself with the Tezspire  and waited in the lobby for fifteen minutes with no issues.   [name] 04/01/2024, 9:29 AM

## 2024-04-03 ENCOUNTER — Telehealth (INDEPENDENT_AMBULATORY_CARE_PROVIDER_SITE_OTHER): Admitting: Psychiatry

## 2024-04-03 ENCOUNTER — Encounter: Payer: Self-pay | Admitting: Psychiatry

## 2024-04-03 DIAGNOSIS — F330 Major depressive disorder, recurrent, mild: Secondary | ICD-10-CM

## 2024-04-03 DIAGNOSIS — F4310 Post-traumatic stress disorder, unspecified: Secondary | ICD-10-CM

## 2024-04-03 NOTE — Patient Instructions (Signed)
 Continue Depakote  500 mg in AM, 1000 mg at night Continue sertraline  25 mg at night Next appointment: 8/13 at 8 40

## 2024-04-05 ENCOUNTER — Other Ambulatory Visit: Payer: Self-pay | Admitting: Psychiatry

## 2024-04-05 DIAGNOSIS — L989 Disorder of the skin and subcutaneous tissue, unspecified: Secondary | ICD-10-CM | POA: Diagnosis not present

## 2024-04-05 DIAGNOSIS — W57XXXD Bitten or stung by nonvenomous insect and other nonvenomous arthropods, subsequent encounter: Secondary | ICD-10-CM | POA: Diagnosis not present

## 2024-04-05 DIAGNOSIS — R5383 Other fatigue: Secondary | ICD-10-CM | POA: Diagnosis not present

## 2024-04-05 DIAGNOSIS — R519 Headache, unspecified: Secondary | ICD-10-CM | POA: Diagnosis not present

## 2024-04-09 DIAGNOSIS — L568 Other specified acute skin changes due to ultraviolet radiation: Secondary | ICD-10-CM | POA: Diagnosis not present

## 2024-04-09 DIAGNOSIS — L2989 Other pruritus: Secondary | ICD-10-CM | POA: Diagnosis not present

## 2024-04-09 DIAGNOSIS — D225 Melanocytic nevi of trunk: Secondary | ICD-10-CM | POA: Diagnosis not present

## 2024-04-09 DIAGNOSIS — D485 Neoplasm of uncertain behavior of skin: Secondary | ICD-10-CM | POA: Diagnosis not present

## 2024-04-09 NOTE — Progress Notes (Signed)
 Remote pacemaker transmission.

## 2024-04-10 ENCOUNTER — Encounter: Payer: Self-pay | Admitting: Gastroenterology

## 2024-04-10 ENCOUNTER — Ambulatory Visit (INDEPENDENT_AMBULATORY_CARE_PROVIDER_SITE_OTHER): Admitting: Gastroenterology

## 2024-04-10 VITALS — BP 125/77 | HR 76 | Temp 98.8°F | Ht 64.0 in | Wt 172.8 lb

## 2024-04-10 DIAGNOSIS — D696 Thrombocytopenia, unspecified: Secondary | ICD-10-CM

## 2024-04-10 DIAGNOSIS — K5900 Constipation, unspecified: Secondary | ICD-10-CM

## 2024-04-10 DIAGNOSIS — K219 Gastro-esophageal reflux disease without esophagitis: Secondary | ICD-10-CM

## 2024-04-10 DIAGNOSIS — Z8379 Family history of other diseases of the digestive system: Secondary | ICD-10-CM | POA: Diagnosis not present

## 2024-04-10 DIAGNOSIS — Z860101 Personal history of adenomatous and serrated colon polyps: Secondary | ICD-10-CM

## 2024-04-10 DIAGNOSIS — L989 Disorder of the skin and subcutaneous tissue, unspecified: Secondary | ICD-10-CM | POA: Diagnosis not present

## 2024-04-10 DIAGNOSIS — Z800 Family history of malignant neoplasm of digestive organs: Secondary | ICD-10-CM | POA: Diagnosis not present

## 2024-04-10 DIAGNOSIS — D72819 Decreased white blood cell count, unspecified: Secondary | ICD-10-CM

## 2024-04-10 DIAGNOSIS — R634 Abnormal weight loss: Secondary | ICD-10-CM

## 2024-04-10 DIAGNOSIS — E113293 Type 2 diabetes mellitus with mild nonproliferative diabetic retinopathy without macular edema, bilateral: Secondary | ICD-10-CM | POA: Diagnosis not present

## 2024-04-10 DIAGNOSIS — R198 Other specified symptoms and signs involving the digestive system and abdomen: Secondary | ICD-10-CM | POA: Diagnosis not present

## 2024-04-10 NOTE — Patient Instructions (Signed)
 Please complete labs for celiac at [facility].  Please try and get me [place] I would like to review records.

## 2024-04-10 NOTE — Progress Notes (Unsigned)
 GI Office Note    Referring Provider: Shona Norleen PEDLAR, MD Primary Care Physician:  Shona Norleen PEDLAR, MD  Primary Gastroenterologist: Carlin POUR. Cindie, DO   Chief Complaint   Chief Complaint  Patient presents with   Follow-up    Discuss alpha-gal    History of Present Illness   Laura [name] is a 63 y.o. female presenting today for follow up. Last seen in office in 01/2024. H/o constipation, GERD, dysphagia, gastritis, chronic intermittent mild thrombocytopenia.   At time of last OV, she had recent onset N/V/D symptoms lasting several days. She did not feel like pantoprazole  controlling her reflux. Having increased heartburn. Had recently switched from omeprazole  to pantoprazole .   Also has been losing weight although may be multifactorial.  She has been following with her therapist and nutritionist because she does have a history of eating disorder which flared back up again several years ago.  Denies any purging.  Her appetite is suppressed may be multifactorial in setting of Trulicity .  Has to go see the eating disorder provider on 01/23/24.    Saw hematology for intermittent mild thrombocytopenia/leukemia. Repeat CBC was normal. No further work up planned at this time.  Today:  Vomiting has improved but still nauseated. Not as bad but still present. Appetite better in the evenings. Nutritionish restricting exercise until eating habits better and dealing with eating d/o.   Tryting to eat better snacks, banans,  Try to eat something in the mornigns. At least a couple of crackers, toast or boiled egg.  Still losing weight.  [place] to dinner. Fell backwards off the steps. Hit lip/teeth/head/neck/back. Loss consciousness three times in ambulance. Brain bleed.  No etoh.   Scanned head Don't remember if blood work  Burping more.  No heartburn.  BM 2-3 times per day.  Some solid stools.  Pain not as bad. No blood in stool. No melena.  Advised  not to eat pork, dairy, beef. Had been eating a lot of bologna sandwiches, mustard. Eating ribeye steaks and hamburger several times per week. Test in May. Trulicty: for DM.   Daughter with celiac.  Not gluten free now.    Wt Readings from Last 50 Encounters:  04/10/24 172 lb 12.8 oz (78.4 kg)  03/18/24 179 lb (81.2 kg)  03/18/24 179 lb (81.2 kg)  02/18/24 183 lb (83 kg)  02/15/24 183 lb (83 kg)  02/13/24 181 lb 15.8 oz (82.6 kg)  02/06/24 182 lb (82.6 kg)  02/01/24 183 lb 3.2 oz (83.1 kg)  01/29/24 184 lb (83.5 kg)  01/24/24 186 lb (84.4 kg)  01/18/24 187 lb (84.8 kg)  01/15/24 185 lb 12.8 oz (84.3 kg)  12/21/23 188 lb (85.3 kg)  11/23/23 188 lb (85.3 kg)  11/16/23 189 lb 3.2 oz (85.8 kg)  10/25/23 191 lb 6.4 oz (86.8 kg)  10/24/23 190 lb 12.8 oz (86.5 kg)  10/23/23 191 lb 9.6 oz (86.9 kg)  10/06/23 193 lb (87.5 kg)  09/12/23 196 lb 9.6 oz (89.2 kg)  08/02/23 202 lb 12.8 oz (92 kg)  07/12/23 202 lb (91.6 kg)  07/12/23 202 lb (91.6 kg)  07/04/23 206 lb (93.4 kg)  06/07/23 207 lb (93.9 kg)  03/08/23 217 lb 12.8 oz (98.8 kg)  03/03/23 219 lb (99.3 kg)  02/17/23 220 lb (99.8 kg)  01/30/23 223 lb (101.2 kg)  01/23/23 222 lb 12.8 oz (101.1 kg)  01/11/23 212 lb (96.2 kg)  12/08/22 218  lb 9.6 oz (99.2 kg)  11/29/22 216 lb (98 kg)  11/15/22 221 lb (100.2 kg)  11/08/22 218 lb (98.9 kg)  11/02/22 217 lb 9.6 oz (98.7 kg)  11/01/22 218 lb (98.9 kg)  11/01/22 218 lb 6.4 oz (99.1 kg)  10/27/22 215 lb (97.5 kg)  09/27/22 220 lb (99.8 kg)  09/20/22 217 lb (98.4 kg)  09/13/22 217 lb (98.4 kg)  09/13/22 216 lb 9.6 oz (98.2 kg)  09/13/22 218 lb 12.8 oz (99.2 kg)  07/29/22 218 lb 0.6 oz (98.9 kg)  07/27/22 218 lb (98.9 kg)  07/27/22 218 lb (98.9 kg)  07/27/22 218 lb 3.2 oz (99 kg)  07/22/22 218 lb (98.9 kg)  07/11/22 219 lb (99.3 kg)   Labs from April 05, 2024: White blood cell count 4.2, hemoglobin 14.9, MCV 106, platelets 156,000, glucose 171, creatinine 0.71, albumin  4.4, total bilirubin 0.2, alk phos 70, AST 15, ALT 15, B12 926, A1c 6  Labs from May 2025: IgE 8, 0215-IgE alpha gal 0.11A (equivocal/low) IgE pork <0.1, IgE beef <0.1, IgE lamb <0.1  CTA abd/pelvis 12/2022: IMPRESSION: 1. No evidence for aortic dissection or aneurysm. 2. No evidence for active gastrointestinal bleeding. Aortic Atherosclerosis (ICD10-I70.0). NON-VASCULAR 1. No acute localizing process in the abdomen or pelvis. 2. Sigmoid colon diverticulosis.   BPE 04/2021: No definite mass or stricture in esophagus. Small sliding type hiatal hernia. 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    EGD 11/2023: -zline regular -gastritis s/p bx. No h.pylori. -normal duodenal bulb, first portion of duodenum and second portion of duodenum -esophageal dilation  Colonoscopy 02/2024: -nonbleeding internal hemorrhoid -diverticulosis -two 4-23mm polyps in descending colon, tubular adenomas -random colon biopsies, neg -next colonoscopy in 5 years    Colonoscopy 01/2021: Non-bleeding internal hemorrhoids. Diverticulosis Two 5-8 mm ascending colon polyps -path tubular adenomas Next colonoscopy in five years     Medications   Current Outpatient Medications  Medication Sig Dispense Refill   apixaban  (ELIQUIS ) 5 MG TABS tablet TAKE 1 TABLET BY MOUTH TWICE  DAILY 200 tablet 1   atorvastatin  (LIPITOR) 20 MG tablet Take 20 mg by mouth at bedtime.     Azelastine  HCl 137 MCG/SPRAY SOLN Place 1 spray into both nostrils daily. 90 mL 1   BD PEN NEEDLE NANO 2ND GEN 32G X 4 MM MISC 1 each by Other route in the morning, at noon, in the evening, and at bedtime. 90 each 0   bisoprolol  (ZEBETA ) 10 MG tablet Take 1 tablet (10 mg total) by mouth daily. 90 tablet 3   blood glucose meter kit and supplies 1 each by Other route 4 (four) times daily. Dispense based on patient and insurance preference. Use up to four times daily as directed. (FOR ICD-10 E10.9, E11.9). 1 each 0    budesonide  (PULMICORT ) 0.5 MG/2ML nebulizer solution Take 0.5 mg via nebulizer twice a day for the next 1-2 weeks or until cough and wheeze free, then stop 100 mL 0   chlorhexidine  (HIBICLENS ) 4 % external liquid Apply topically daily as needed. 236 mL 0   Cholecalciferol (VITAMIN D3) 125 MCG (5000 UT) CAPS Take 1 capsule (5,000 Units total) by mouth daily. 90 capsule 0   Continuous Glucose Sensor (FREESTYLE LIBRE 3 SENSOR) MISC 1 Piece by Does not apply route every 14 (fourteen) days. Place 1 sensor on the skin every 14 days. Use to check glucose continuously 6 each 1   diltiazem  (CARDIZEM  CD) 300 MG 24 hr capsule Take 1 capsule (  300 mg total) by mouth daily. 90 capsule 3   divalproex  (DEPAKOTE  ER) 500 MG 24 hr tablet Take 1 tablet (500 mg total) by mouth daily AND 2 tablets (1,000 mg total) at bedtime. 270 tablet 0   ELDERBERRY PO Take 4 g by mouth daily. Gummies 2g each     EPINEPHrine  0.3 mg/0.3 mL IJ SOAJ injection Inject 0.3 mg into the muscle once as needed for anaphylaxis.     FERROUS SULFATE  PO Take 65 mg by mouth every evening.     fluticasone  furoate-vilanterol (BREO ELLIPTA ) 100-25 MCG/ACT AEPB Inhale 1 puff into the lungs daily. 60 each 5   gabapentin  (NEURONTIN ) 600 MG tablet Take 600 mg by mouth 3 (three) times daily.     HYDROcodone -acetaminophen  (NORCO/VICODIN) 5-325 MG tablet Take 1 tablet by mouth every 6 (six) hours as needed. 20 tablet 0   hydrOXYzine  (ATARAX ) 25 MG tablet TAKE ONE TABLET BY MOUTH THREE TIMES DAILY AS NEEDED FOR ANXIETY OR FOR ITCHING 30 tablet 1   Immune Globulin , Human, (CUVITRU  [place]) Inject 27 mg into the skin every 14 (fourteen) days.     insulin  glargine (LANTUS  SOLOSTAR) 100 UNIT/ML Solostar Pen Inject 20 Units into the skin at bedtime. 15 mL 1   Insulin  Pen Needle (PEN NEEDLES 3/16) 31G X 5 MM MISC Use as directed with insulin  pen 100 each 0   JARDIANCE  10 MG TABS tablet TAKE 1 TABLET BY MOUTH DAILY  BEFORE BREAKFAST 100 tablet 2   lansoprazole   (PREVACID ) 30 MG capsule Take 1 capsule (30 mg total) by mouth 2 (two) times daily before a meal. 180 capsule 3   levalbuterol  (XOPENEX  HFA) 45 MCG/ACT inhaler USE 2 INHALATIONS BY MOUTH EVERY 6 HOURS AS NEEDED FOR WHEEZING 30 g 6   levalbuterol  (XOPENEX ) 1.25 MG/3ML nebulizer solution Use 1 vial in the nebulizer every 6 hours as needed for cough, wheeze, shortness of breath or chest tightness. 225 mL 1   levothyroxine  (SYNTHROID , LEVOTHROID) 112 MCG tablet Take 112 mcg by mouth daily before breakfast.     linaclotide  (LINZESS ) 290 MCG CAPS capsule Take 1 capsule (290 mcg total) by mouth daily before breakfast. 90 capsule 3   losartan  (COZAAR ) 50 MG tablet Take 50 mg by mouth every morning.     MELATONIN GUMMIES PO Take 10 mg by mouth at bedtime.     metFORMIN  (GLUCOPHAGE ) 500 MG tablet Take 500 mg by mouth 2 (two) times daily.     montelukast  (SINGULAIR ) 10 MG tablet TAKE 1 TABLET BY MOUTH AT  BEDTIME 100 tablet 1   Multiple Vitamins-Minerals (HAIR SKIN & NAILS PO) Take 1 tablet by mouth daily.     Multiple Vitamins-Minerals (MULTIVITAMIN WITH MINERALS) tablet Take 1 tablet by mouth daily. Woman     mupirocin  ointment (BACTROBAN ) 2 % Apply 1 Application topically 2 (two) times daily. 60 g 0   Nebulizer MISC 1 Device by Other route as directed. Nebulizer tubing kit 1 each 1   ondansetron  (ZOFRAN -ODT) 8 MG disintegrating tablet Take 8 mg by mouth daily as needed for vomiting or nausea.     ONETOUCH ULTRA test strip 1 each by Other route in the morning, at noon, in the evening, and at bedtime.     polyethylene glycol (MIRALAX  / GLYCOLAX ) 17 g packet Take 17 g by mouth daily as needed. 14 each 0   Potassium Chloride  ER 20 MEQ TBCR Take 20 mEq by mouth daily.     sertraline  (ZOLOFT ) 50 MG tablet Take  1 tablet (50 mg total) by mouth at bedtime. 90 tablet 0   Spacer/Aero-Holding Chambers DEVI 1 Device by Does not apply route as directed. 1 each 1   Tezepelumab -ekko (TEZSPIRE ) 210 MG/1. SOAJ Inject  210 mg into the skin every 28 (twenty-eight) days. 1.91 mL 11   topiramate  (TOPAMAX ) 100 MG tablet TAKE 2  TABLETS BY MOUTH  IN THE MORNING AND 1 TABLET IN  THE EVENING 450 tablet 3   TRULICITY  4.5 MG/0.5ML SOAJ INJECT THE CONTENTS OF ONE PEN  SUBCUTANEOUSLY WEEKLY AS  DIRECTED 6 mL 3   Current Facility-Administered Medications  Medication Dose Route Frequency Provider Last Rate Last Admin   tezepelumab -ekko (TEZSPIRE ) 210 MG/1. syringe 210 mg  210 mg Subcutaneous Q28 days Iva Marty Saltness, MD   210 mg at 04/01/24 9060    Allergies   Allergies as of 04/10/2024 - Review Complete 04/10/2024  Allergen Reaction Noted   Alpha-gal  03/18/2024   Ativan [lorazepam] Hives 02/06/2018   Phenergan [promethazine hcl] Hives 02/06/2018     Past Medical History   Past Medical History:  Diagnosis Date   Anemia    Aortic atherosclerosis (HCC)    Asthma    Atrial fibrillation and flutter (HCC)    a.) CHA2DS2VASc = 6 (sex, HTN, TIA x2, vascular disease history, T2DM);  b.) DCCV ~ 2009; c.) DCCV 08/29/2018 (200J x1 --> SR with 1st degree AVB); d.) rate/rhythm maintained on oral diltiazem  + bisoprolol ; chronically anticoagulated with apixaban    Bipolar disorder (HCC)    Carpal tunnel syndrome of right wrist    CHB (complete heart block) (HCC)    a.) s/p St. Jude dual chamber PPM placement   CHF (congestive heart failure) (HCC)    Complication of anesthesia    a.) allergic reaction to perioperative analgesics + anesthetic medications in [place] ; unsure of combination; not able to recount reactions/events --> states I ended up in the ICU for 5 days   Constipation    a.) on linaclotide    COPD (chronic obstructive pulmonary disease) (HCC)    DDD (degenerative disc disease), thoracic    GERD (gastroesophageal reflux disease)    H/O congenital atrial septal defect (ASD) repair    Hallux valgus of right foot    Hammertoe of second toe of right foot    History of 2019 novel coronavirus  disease (COVID-19)    a.) 10/2020; b.) 05/2021   HLD (hyperlipidemia)    HTN (hypertension)    Hypogammaglobulinemia (HCC)    a.) on Curitru   Hypothyroid    IgE deficiency (HCC)    Long term current use of anticoagulant    a.) apixaban    Lumbar stenosis    Migraines    Mitral stenosis 02/08/2022   a.) TTE 02/08/2022: EF 60-65%, mild MAC, mild MV stenosis (peak grad 14.6 / mean grad 4.0)   Neuropathy    NSVT (nonsustained ventricular tachycardia) (HCC) 10/31/2018   a.) Holter 10/31/2018: 6 beat run   Obesity    OSA on CPAP    Osteoarthritis    Presence of permanent cardiac 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 Jude dual chamber device; placed for symptomatic bradycardia secondary to intermittent CHB   PTSD (post-traumatic stress disorder)    Pulmonary nodules    Recurrent sinusitis    Short-term memory loss    Sleep difficulties    a.) takes melatonin   Symptomatic bradycardia    a.) s/p St. Jude dual chamber PPM placement   Syncope    TIA (  transient ischemic attack)    x3-last one in 2015   Tremors of nervous system    Type 2 diabetes mellitus (HCC)     Past Surgical History   Past Surgical History:  Procedure Laterality Date   ASD REPAIR  1968   BALLOON DILATION N/A 11/27/2023   Procedure: BALLOON DILATION;  Surgeon: Cindie Carlin POUR, DO;  [facility]: [facility];  Service: Endoscopy;  Laterality: N/A;  9:00 am, asa 3   BIOPSY  01/26/2021   Procedure: BIOPSY;  Surgeon: Cindie Carlin POUR, DO;  Location: [facility];  Service: Endoscopy;;  gastric   BIOPSY  11/27/2023   Procedure: BIOPSY;  Surgeon: Cindie Carlin POUR, DO;  [facility]: [facility];  Service: Endoscopy;;   CARDIOVERSION  2009   CARDIOVERSION N/A 08/29/2018   Procedure: CARDIOVERSION;  Surgeon: Alvan Dorn FALCON, MD;  [facility]: [facility];  Service: Endoscopy;  Laterality: N/A;   CARPAL TUNNEL RELEASE Bilateral    CESAREAN SECTION  1986, 1988, 1994   COLONOSCOPY N/A 02/15/2024   Procedure: COLONOSCOPY;   Surgeon: Cindie Carlin POUR, DO;  [facility]: [facility];  Service: Endoscopy;  Laterality: N/A;  8:30 am, asa 3   COLONOSCOPY WITH PROPOFOL  N/A 01/26/2021   Procedure: COLONOSCOPY WITH PROPOFOL ;  Surgeon: Cindie Carlin POUR, DO;  [facility]: [facility];  Service: Endoscopy;  Laterality: N/A;  am appt, diabetic   ESOPHAGOGASTRODUODENOSCOPY (EGD) WITH PROPOFOL  N/A 01/26/2021   Procedure: ESOPHAGOGASTRODUODENOSCOPY (EGD) WITH PROPOFOL ;  Surgeon: Cindie Carlin POUR, DO;  [facility];  Service: Endoscopy;  Laterality: N/A;   ESOPHAGOGASTRODUODENOSCOPY (EGD) WITH PROPOFOL  N/A 11/27/2023   Procedure: ESOPHAGOGASTRODUODENOSCOPY (EGD) WITH PROPOFOL ;  Surgeon: Cindie Carlin POUR, DO;  [facility];  Service: Endoscopy;  Laterality: N/A;  9:00 am, asa 3   HALLUX VALGUS [place]USTIN Right 07/29/2022   Procedure: HALLUX VALGUS;  Surgeon: Silva Juliene SAUNDERS, DPM;  Location: [facility];  Service: Podiatry;  Laterality: Right;   HAMMER TOE SURGERY Right 07/29/2022   Procedure: HAMMER TOE CORRECTION 2ND;  Surgeon: Silva Juliene SAUNDERS, DPM;  Location: [facility];  Service: Podiatry;  Laterality: Right;   LEFT HEART CATH AND CORONARY ANGIOGRAPHY N/A 08/30/2019   Procedure: LEFT HEART CATH AND CORONARY ANGIOGRAPHY;  Surgeon: Dann Candyce RAMAN, MD;  Location: [facility] INVASIVE CV [facility];  Service: Cardiovascular;  Laterality: N/A;   NASAL SINUS SURGERY  2017   PACEMAKER IMPLANT N/A 03/03/2022   Procedure: PACEMAKER IMPLANT;  Surgeon: Waddell Danelle ORN, MD;  Location: [facility] CV [facility];  Service: Cardiovascular;  Laterality: N/A;   POLYPECTOMY  01/26/2021   Procedure: POLYPECTOMY;  Surgeon: Cindie Carlin POUR, DO;  Location[facility];  Service: Endoscopy;;  colon   TOOTH EXTRACTION N/A 07/04/2023   Procedure: DENTAL RESTORATION/EXTRACTIONS;  Surgeon: Sheryle Hamilton, DMD;  Location: [facility];  Service: Oral Surgery;  Laterality: N/A;    Past Family History   Family History  Problem Relation Age of Onset    Hypertension Mother    Stroke Mother    Kidney disease Mother    Heart attack Mother    Alcohol abuse Mother    Colon cancer Father        colon cancer   Kidney disease Sister    Hypertension Sister    Bipolar disorder Sister    Atrial fibrillation Sister    Heart disease Brother    Prostate cancer Brother    Thyroid  disease Daughter    Hashimoto's thyroiditis Daughter    Celiac  disease Daughter    Allergic rhinitis Daughter    Stroke Maternal Aunt    Heart disease Maternal Aunt    Asthma Neg Hx     Past Social History   Social History   Socioeconomic History   Marital status: Widowed    Spouse name: Not on file   Number of children: 3   Years of education: 10   Highest education level: Not on file  Occupational History   Not on file  Tobacco Use   Smoking status: Former    Current packs/day: 0.00    Average packs/day: 1 pack/day for 28.0 years (28.0 ttl pk-yrs)    Types: Cigarettes    Start date: 34    Quit date: 2008    Years since quitting: 17.5   Smokeless tobacco: Never  Vaping Use   Vaping status: Never Used  Substance and Sexual Activity   Alcohol use: Not Currently   Drug use: Not Currently   Sexual activity: Not Currently    Birth control/protection: Post-menopausal  Other Topics Concern   Not on file  Social History Narrative   Right handed   Drinks caffeine   One story home   Social Drivers of Health   Financial Resource Strain: High Risk (01/06/2022)   Overall Financial Resource Strain (CARDIA)    Difficulty of Paying Living Expenses: Hard  Food Insecurity: No Food Insecurity (01/18/2024)   Hunger Vital Sign    Worried About Running Out of Food in the Last Year: Never true    Ran Out of Food in the Last Year: Never true  Transportation Needs: No Transportation Needs (01/18/2024)   PRAPARE - Administrator, Civil Service (Medical): No    Lack of Transportation (Non-Medical): No  Physical Activity: Insufficiently Active  (01/06/2022)   Exercise Vital Sign    Days of Exercise per Week: 4 days    Minutes of Exercise per Session: 30 min  Stress: No Stress Concern Present (01/06/2022)   [employer] of Occupational Health - Occupational Stress Questionnaire    Feeling of Stress : Only a little  Social Connections: Moderately Integrated (01/06/2022)   Social Connection and Isolation Panel    Frequency of Communication with Friends and Family: More than three times a week    Frequency of Social Gatherings with Friends and Family: More than three times a week    Attends Religious Services: More than 4 times per year    Active Member of [employer] or Organizations: Yes    Attends Banker Meetings: More than 4 times per year    Marital Status: Widowed  Intimate Partner Violence: Not At Risk (01/18/2024)   Humiliation, Afraid, Rape, and Kick questionnaire    Fear of Current or Ex-Partner: No    Emotionally Abused: No    Physically Abused: No    Sexually Abused: No    Review of Systems   General: Negative for anorexia, weight loss, fever, chills, fatigue, weakness. ENT: Negative for hoarseness, difficulty swallowing , nasal congestion. CV: Negative for chest pain, angina, palpitations, dyspnea on exertion, peripheral edema.  Respiratory: Negative for dyspnea at rest, dyspnea on exertion, cough, sputum, wheezing.  GI: See history of present illness. GU:  Negative for dysuria, hematuria, urinary incontinence, urinary frequency, nocturnal urination.  Endo: Negative for unusual weight change.     Physical Exam   BP 125/77 (BP Location: Left Arm, Patient Position: Sitting, Cuff Size: Normal)   Pulse 76   Temp 98.8 F (37.1  C) (Oral)   Ht 5' 4 (1.626 m)   Wt 172 lb 12.8 oz (78.4 kg)   SpO2 97%   BMI 29.66 kg/m    General: Well-nourished, well-developed in no acute distress.  Eyes: No icterus. Mouth: Oropharyngeal mucosa moist and pink   Lungs: Clear to auscultation bilaterally.  Heart:  Regular rate and rhythm, no murmurs rubs or gallops.  Abdomen: Bowel sounds are normal, nontender, nondistended, no hepatosplenomegaly or masses,  no abdominal bruits or hernia , no rebound or guarding.  Rectal: not performed Extremities: No lower extremity edema. No clubbing or deformities. Neuro: Alert and oriented x 4   Skin: Warm and dry, no jaundice.   Psych: Alert and cooperative, normal mood and affect.  Labs   Lab Results  Component Value Date   HGBA1C 6.0 03/18/2024   Lab Results  Component Value Date   NA 137 02/18/2024   CL 110 02/18/2024   K 4.3 02/18/2024   CO2 21 (L) 02/18/2024   BUN 19 02/18/2024   CREATININE 0.66 02/18/2024   GFRNONAA >60 02/18/2024   CALCIUM  8.9 02/18/2024   PHOS 3.0 04/08/2021   ALBUMIN 3.6 02/18/2024   GLUCOSE 133 (H) 02/18/2024   Lab Results  Component Value Date   WBC 4.2 02/18/2024   HGB 14.5 02/18/2024   HCT 41.2 02/18/2024   MCV 99.8 02/18/2024   PLT 162 02/18/2024   Lab Results  Component Value Date   ALT 14 02/18/2024   AST 16 02/18/2024   ALKPHOS 62 02/18/2024   BILITOT 0.5 02/18/2024    Imaging Studies   No results found.  Assessment/Plan:     GERD: -poorly controlled -reinforced antireflux measures -stop pantoprazole . Start lansoprazole  30mg  BID before meals   Constipation: -continue Linzess    Chronic intermittent mild thrombocytopenia/leukemia -patient wants to go see hematology    Change in bowels/FH CRC/personal history of adenomatous colon polyps -colonoscopy. ASA 3.  I have discussed the risks, alternatives, benefits with regards to but not limited to the risk of reaction to medication, bleeding, infection, perforation and the patient is agreeable to proceed. Written consent to be obtained.   Weight loss: likely multifactorial in setting of Trulicity , eating disorder -update colonoscopy   ***  Lesions/picking skin saying ticks Sonny RAMAN. Ezzard, MHS, PA-C [facility]

## 2024-04-11 ENCOUNTER — Ambulatory Visit: Payer: Self-pay | Admitting: Gastroenterology

## 2024-04-11 ENCOUNTER — Encounter: Payer: Self-pay | Admitting: Gastroenterology

## 2024-04-11 DIAGNOSIS — G4733 Obstructive sleep apnea (adult) (pediatric): Secondary | ICD-10-CM | POA: Diagnosis not present

## 2024-04-11 DIAGNOSIS — Z8379 Family history of other diseases of the digestive system: Secondary | ICD-10-CM

## 2024-04-11 DIAGNOSIS — D839 Common variable immunodeficiency, unspecified: Secondary | ICD-10-CM | POA: Diagnosis not present

## 2024-04-11 NOTE — Progress Notes (Signed)
 Reviewed records from ED visit 03/30/24 at [facility] after recent fall down several steps, with loss of consciousness.   Extensive evaluation including plain films of her chest, neck, right foot, pelvis with most significant finding of cardiomegaly. No fractures. CTA neck/chest/abd/pelvis without acute findings. CT head without acute findings, no brain bleed as patient reported.   Labs: normal CBC with normal EOS. Normal LFTs. Glucose of 209. BNP 1130. Alcohol level <0.1

## 2024-04-14 LAB — IGA: IgA/Immunoglobulin A, Serum: 33 mg/dL — ABNORMAL LOW (ref 87–352)

## 2024-04-14 LAB — TISSUE TRANSGLUTAMINASE, IGA: Transglutaminase IgA: <2 U/mL (ref 0–3)

## 2024-04-15 ENCOUNTER — Telehealth: Payer: Self-pay

## 2024-04-15 DIAGNOSIS — L988 Other specified disorders of the skin and subcutaneous tissue: Secondary | ICD-10-CM | POA: Diagnosis not present

## 2024-04-15 DIAGNOSIS — D485 Neoplasm of uncertain behavior of skin: Secondary | ICD-10-CM | POA: Diagnosis not present

## 2024-04-15 DIAGNOSIS — D225 Melanocytic nevi of trunk: Secondary | ICD-10-CM | POA: Diagnosis not present

## 2024-04-15 DIAGNOSIS — Z789 Other specified health status: Secondary | ICD-10-CM | POA: Diagnosis not present

## 2024-04-15 NOTE — Telephone Encounter (Signed)
 Pt called wanting to know her results, states that she is able to see them on mychart. Advised pt that providers have 5 business days to get them back to them.

## 2024-04-16 DIAGNOSIS — Z8379 Family history of other diseases of the digestive system: Secondary | ICD-10-CM | POA: Diagnosis not present

## 2024-04-16 NOTE — Telephone Encounter (Signed)
 See result note.

## 2024-04-19 ENCOUNTER — Other Ambulatory Visit (HOSPITAL_COMMUNITY): Payer: Self-pay | Admitting: Internal Medicine

## 2024-04-19 DIAGNOSIS — Z1231 Encounter for screening mammogram for malignant neoplasm of breast: Secondary | ICD-10-CM

## 2024-04-21 ENCOUNTER — Other Ambulatory Visit: Payer: Self-pay | Admitting: Psychiatry

## 2024-04-23 ENCOUNTER — Telehealth (INDEPENDENT_AMBULATORY_CARE_PROVIDER_SITE_OTHER): Payer: Self-pay | Admitting: *Deleted

## 2024-04-23 ENCOUNTER — Telehealth: Payer: Self-pay | Admitting: Gastroenterology

## 2024-04-23 DIAGNOSIS — E1165 Type 2 diabetes mellitus with hyperglycemia: Secondary | ICD-10-CM | POA: Diagnosis not present

## 2024-04-23 NOTE — Telephone Encounter (Signed)
 Pt called office for results of recent blood work and told patient blood work results are not ready. Informed patient results go into My chart when results is ready. Pt was understanding.

## 2024-04-23 NOTE — Telephone Encounter (Signed)
 Called [facility], was informed that (Celiac HLA Reflex to Abs) test can take 6 to 12 business day to results. Pt went and had lab done on 7/8.

## 2024-04-23 NOTE — Telephone Encounter (Signed)
 Laura [name] has gotten a couple of calls from this patient today regarding results.   I can't tell from documentation by Tammy C if patient was advised of the 04/10/24 lab results or just to have more labs.  Please call patient and let her know that since her IgA is very low, we cannot use the Tissue transglutaminase, IgA results. It is not reliable. That is why we needed her to go for additional labs. I do not have those results yet (Celiac HLA Reflex to Abs). Tammy C spoke with patient 7/8 about having more labs done.   Find out when and where she had the second set of labs done. On our end it does not show any activity.

## 2024-04-23 NOTE — Telephone Encounter (Signed)
 04/22/2024 Voice Mail at 9:10  Still has not gotten any of her test results and she doesn't understand why and want to understand she had these on 04/10/2024 and repeated her celiac and still nothing on Mychart.  I can call her if you need me to on the why --delay because test takes x amount of days for this panel that she went on 7/6 and had for celiac.

## 2024-04-23 NOTE — Telephone Encounter (Signed)
 noted

## 2024-04-24 ENCOUNTER — Other Ambulatory Visit: Payer: Self-pay | Admitting: Medical Genetics

## 2024-04-25 ENCOUNTER — Other Ambulatory Visit (HOSPITAL_COMMUNITY)
Admission: RE | Admit: 2024-04-25 | Discharge: 2024-04-25 | Disposition: A | Discharge status: Home / Self Care | Payer: Self-pay | Source: Ambulatory Visit | Attending: Oncology | Admitting: Oncology

## 2024-04-25 ENCOUNTER — Ambulatory Visit (INDEPENDENT_AMBULATORY_CARE_PROVIDER_SITE_OTHER): Admitting: Clinical

## 2024-04-25 ENCOUNTER — Other Ambulatory Visit: Payer: Self-pay | Admitting: "Endocrinology

## 2024-04-25 ENCOUNTER — Other Ambulatory Visit: Payer: Self-pay | Admitting: Cardiology

## 2024-04-25 DIAGNOSIS — F4310 Post-traumatic stress disorder, unspecified: Secondary | ICD-10-CM | POA: Diagnosis not present

## 2024-04-25 DIAGNOSIS — E1165 Type 2 diabetes mellitus with hyperglycemia: Secondary | ICD-10-CM

## 2024-04-25 DIAGNOSIS — D839 Common variable immunodeficiency, unspecified: Secondary | ICD-10-CM | POA: Diagnosis not present

## 2024-04-25 NOTE — Progress Notes (Signed)
 IN PERSON    I connected with Laura [name] on 04/25/2024 at 2:00 PM EST in person and verified that I am speaking with the correct person using two identifiers.   Location: Patient: Home Provider: Office   I discussed the limitations of evaluation and management by telemedicine and the availability of in person appointments. The patient expressed understanding and agreed to proceed. ( IN PERSON )   THERAPIST PROGRESS NOTE   Session Time: 2:00 PM-:2:55 PPM   Participation Level: Active   Behavioral Response: CasualAlertDepressed   Type of Therapy: Individual Therapy   Treatment Goals addressed: Coping/ non-medication treatment therapy for PTSD.   Interventions: CBT, DBT, Solution Focused, Strength-based and Supportive   Summary: Laura [name] is a 63 y.o. female who presents with PTSD. The OPT therapist worked with the patient for her ongoing OPT treatment session. The OPT therapist utilized Motivational Interviewing to assist in creating therapeutic repore.The patient in the session was engaged and work in collaboration giving feedback about her triggers and symptoms over the past few weeks.  The patient spoke about recent health appointments and was upset post reviewing her information documentation after a recent health care appointment feeling that the documentation from the provider was misrepresenting and not factual. The OPT therapist overviewed with the patient the situation and gained her perspective and gave feedback that the pateint should have a conversation with that provider.The OPT therapist utilized Cognitive Behavioral Therapy through cognitive restructuring as well as worked with the patient on coping strategies to assist in management of mental health symptoms. The OPT therapist worked with the patient looking at current external stressor and the impact of them. The patient has been utilizing her coping skills for Summer, however,due to tick bite and Alpha Gal dx the  patient has not been able to get back to Fishing due to concern of getting additional tick bites.The OPT therapist placed emphasis on the patient keeping all upcoming health appointments and adhering to ongoing health treatment recommendations. The OPT therapist reviewed all appointments listed coming up in the patients MyChart    Suicidal/Homicidal: Nowithout intent/plan   Therapist Response:The OPT therapist worked with the patient for the patients scheduled session. The patient was engaged in her session and gave feedback in relation to triggers, symptoms, and behavior responses over the past few weeks. The patient spoke about . The OPT therapist worked with the patient utilizing an in session Cognitive Behavioral Therapy exercise. The OPT therapist worked with the patient allowing a safe place to vent and talk about current stressors. The patient has continued to work with her physical health providers to manage physical health conditions. The patient spoke about her mood and interactions with family recently The patient spoke about being upset with misrepresented in a recent post visit documentation that was in the patients MyChart. The OPT therapist encouraged the patient due to how strongly she felt to talk with the provider about her feelings to assist reducing change of ongoing or future damage in the professional relationship between the provider and patient.The OPT therapist will continue treatment work with the patient in her next scheduled session.   Plan: Return again in 2/3 weeks.   Diagnosis:      Axis I: Post Traumatic Stress Disorder                            Axis II: No diagnosis     Collaboration of Care: Overview with the patient of  involvement in the Med Management program with Dr. Vickey.   Patient/Guardian was advised Release of Information must be obtained prior to any record release in order to collaborate their care with an outside provider. Patient/Guardian was advised if  they have not already done so to contact the registration department to sign all necessary forms in order for us  to release information regarding their care.    Consent: Patient/Guardian gives verbal consent for treatment and assignment of benefits for services provided during this visit. Patient/Guardian expressed understanding and agreed to proceed.      I discussed the assessment and treatment plan with the patient. The patient was provided an opportunity to ask questions and all were answered. The patient agreed with the plan and demonstrated an understanding of the instructions.   The patient was advised to call back or seek an in-person evaluation if the symptoms worsen or if the condition fails to improve as anticipated.   I provided 55 minutes of face-to-face time during this encounter.   Jerel Pepper, LCSW    04/25/2024

## 2024-04-25 NOTE — Progress Notes (Signed)
 [address] [place] [address] Dept: [phone]  FOLLOW UP NOTE  Patient ID: Laura [name], female    DOB: 04-14-1961  Age: 63 y.o. MRN: [id number] Date of Office Visit: 04/26/2024  Assessment  Chief Complaint: Follow-up, Wheezing, Breathing Problem, and Cough  HPI Laura [name] is a 63 year old female who presents to the clinic for follow-up visit.  She was last seen in this clinic on 01/24/2024 by Arlean Mutter, FNP, for evaluation of asthma, chronic rhinitis, CVID, and reflux.  She has since stopped taking Fasenra  and started tezspire  injections.  In the interim, she has visited the emergency department for chest pain and urgent care for tick removal.  At today's visit, she reports her asthma has been moderately well controlled with her usual symptoms worsening over the last several weeks. She reports shortness of breath, wheeze and cough with activity and rest. She reports that she usually experiences these symptoms however, they have worsened over the last few weeks. She continues montelukast  10 mg once a day, Breo 100-1 puff once a day, and has received her first tezspire  injection.  She reports that she rarely uses Xopenex  for relief of symptoms.  She is not currently using budesonide  via nebulizer for asthma flare.  Chronic rhinitis is reported as moderately well-controlled with occasional nasal symptoms.  She continues nasal saline rinses, Nasacort , and azelastine  as needed.  Her last environmental allergy testing via lab on 02/08/2018 was negative to the environmental panel.  She reports that she is currently taking amoxicillin  for a dental issue.  Otherwise, she denies infections or antibiotics since her last visit to this clinic.  She continues Cuvitru  27 grams every other week with no large or local reactions. She reports fewer infections while.continuing on Cuvitru  infusions. She has lost a significant amount of weight recently and is interested in a lower dose of  Cuvutru.  Her last immunoglobulin lab on 11/15/2023 indicate IgG 1561, IgA 38, and IgM 76. . She recently started smoking cigarettes and smokes approximately 1 pack a day at this time.  Reflux is reported as well-controlled with no symptoms including heartburn or vomiting.  She continues to follow-up with her gastroenterology specialist.  She reports recently being diagnosed with alpha gal allergy and has subsequently stopped consuming mammalian products.  Chart review indicates labs from May 2025: Total IgE 8 , alpha gal IgE 0.11A (equivocal/low) pork IgE <0.1 beef IgE <0.1,  lamb IgE <0.1   Her current medications are listed in the chart.  Drug Allergies:  Allergies  Allergen Reactions   Alpha-Gal    Ativan [Lorazepam] Hives   Phenergan [Promethazine Hcl] Hives    Physical Exam: There were no vitals taken for this visit.   Physical Exam Vitals reviewed.  Constitutional:      Appearance: Normal appearance.  HENT:     Head: Normocephalic and atraumatic.     Right Ear: Tympanic membrane normal.     Left Ear: Tympanic membrane normal.     Nose:     Comments: Bilateral nares slightly erythematous with thin clear nasal drainage noted.  Pharynx normal.  Ears normal.  Eyes normal.    Mouth/Throat:     Pharynx: Oropharynx is clear.  Eyes:     Conjunctiva/sclera: Conjunctivae normal.  Cardiovascular:     Rate and Rhythm: Normal rate.     Heart sounds: Normal heart sounds. No murmur heard. Pulmonary:     Effort: Pulmonary effort is normal.     Breath sounds: Normal breath  sounds.     Comments: Lungs clear to auscultation Musculoskeletal:        General: Normal range of motion.     Cervical back: Normal range of motion and neck supple.  Skin:    General: Skin is warm and dry.  Neurological:     Mental Status: She is alert and oriented to person, place, and time.  Psychiatric:        Mood and Affect: Mood normal.        Behavior: Behavior normal.        Thought Content: Thought  content normal.        Judgment: Judgment normal.     Diagnostics: FVC 2.27 which is 78% of predicted value, FEV1 1.66 which is 73% of predicted value.  Spirometry indicates normal ventilatory function  Assessment and Plan: 1. Not well controlled severe persistent asthma   2. Common variable immunodeficiency (HCC)   3. Gastroesophageal reflux disease, unspecified whether esophagitis present   4. Non-allergic rhinitis   5. Tobacco use     Meds ordered this encounter  Medications   amoxicillin  (AMOXIL ) 500 MG tablet    Sig: Take 1 tablet (500 mg total) by mouth 3 (three) times daily.    Dispense:  15 tablet    Refill:  0    Patient Instructions  1. Hypogammaglobulinemia - Continue with Cuvitru . Let's decrease to 23 grams every 2 weeks - We might consider increasing the dosage if this frequency of infections worsen.   2. Chronic rhinitis   - Continue with nasal saline rinses as needed.  - Continue with Nasacort  daily as instructed by your primary care provider.  - You can use this with your Astepro /azelastine . - We can do skin testing at some point if needed.   3. Severe persistent asthma, uncomplicated Lung testing looks good today but you sound terrible. Continue amoxicillin  as previously prescribed. If your symptoms worsen call the clinic - Begin budesonide  0.5 mg via nebulizer twice a day for the next 1-2 weeks or until cough and wheeze free, then stop - Daily medication: Continue Breo178mcg one puff once daily + montelukast  10 mg once a day + Fasenra  every 8 weeks - Prior to physical activity: Xopenex  2 puffs 10-15 minutes before physical activity. - Rescue medications: Xopenex  nebulizer every 4-6 hours as needed, Xopenex  4 puffs every 4-6 hours as needed, or DuoNeb nebulizer one vial every 4-6 hours as needed - Continue Fasenra  injections once every 8 weeks for asthma control - Asthma control goals:  * Full participation in all desired activities (may need albuterol   before activity) * Albuterol  use two time or less a week on average (not counting use with activity) * Cough interfering with sleep two time or less a month * Oral steroids no more than once a year * No hospitalizations.  4.  Reflux  -Continue dietary lifestyle modifications as listed below  -Continue to follow up with your GI specialist as recommended  5.  Tobacco use - Consider cutting down and better to quit  6. Alpha gal - Continue to avoid mammalian meats.  In case of an allergic reaction,take cetirizine 10 mg once every 12-24 hours, and if life-threatening symptoms occur, inject with EpiPen  0.3 mg. Follow up in 3 months or sooner if needed  It was a pleasure to see you again today!  Call the clinic if this treatment plan is not working well for you  Follow up in 3 months or sooner if needed.  Return in about 3 months (around 07/27/2024).    Thank you for the opportunity to care for this patient.  Please do not hesitate to contact me with questions.  Arlean Mutter, FNP [facility] 

## 2024-04-25 NOTE — Patient Instructions (Addendum)
 1. Hypogammaglobulinemia - Continue with Cuvitru . Let's decrease to 23 grams every 2 weeks - We might consider increasing the dosage if this frequency of infections worsen.   2. Chronic rhinitis   - Continue with nasal saline rinses as needed.  - Continue with Nasacort  daily as instructed by your primary care provider.  - You can use this with your Astepro /azelastine . - We can do skin testing at some point if needed.   3. Severe persistent asthma, uncomplicated Lung testing looks good today but you sound terrible. Continue amoxicillin  as previously prescribed. If your symptoms worsen call the clinic - Begin budesonide  0.5 mg via nebulizer twice a day for the next 1-2 weeks or until cough and wheeze free, then stop - Daily medication: Continue Breo159mcg one puff once daily + montelukast  10 mg once a day + Fasenra  every 8 weeks - Prior to physical activity: Xopenex  2 puffs 10-15 minutes before physical activity. - Rescue medications: Xopenex  nebulizer every 4-6 hours as needed, Xopenex  4 puffs every 4-6 hours as needed, or DuoNeb nebulizer one vial every 4-6 hours as needed - Continue Fasenra  injections once every 8 weeks for asthma control - Asthma control goals:  * Full participation in all desired activities (may need albuterol  before activity) * Albuterol  use two time or less a week on average (not counting use with activity) * Cough interfering with sleep two time or less a month * Oral steroids no more than once a year * No hospitalizations.  4.  Reflux  -Continue dietary lifestyle modifications as listed below  -Continue to follow up with your GI specialist as recommended  5.  Tobacco use - Consider cutting down and better to quit  6. Alpha gal - Continue to avoid mammalian meats.  In case of an allergic reaction,take cetirizine 10 mg once every 12-24 hours, and if life-threatening symptoms occur, inject with EpiPen  0.3 mg. Follow up in 3 months or sooner if needed  It was a  pleasure to see you again today!  Call the clinic if this treatment plan is not working well for you  Follow up in 3 months or sooner if needed.

## 2024-04-26 ENCOUNTER — Other Ambulatory Visit: Payer: Self-pay

## 2024-04-26 ENCOUNTER — Ambulatory Visit: Admitting: Family Medicine

## 2024-04-26 DIAGNOSIS — Z720 Tobacco use: Secondary | ICD-10-CM | POA: Diagnosis not present

## 2024-04-26 DIAGNOSIS — J310 Chronic rhinitis: Secondary | ICD-10-CM

## 2024-04-26 DIAGNOSIS — K219 Gastro-esophageal reflux disease without esophagitis: Secondary | ICD-10-CM

## 2024-04-26 DIAGNOSIS — E039 Hypothyroidism, unspecified: Secondary | ICD-10-CM | POA: Diagnosis not present

## 2024-04-26 DIAGNOSIS — E559 Vitamin D deficiency, unspecified: Secondary | ICD-10-CM | POA: Diagnosis not present

## 2024-04-26 DIAGNOSIS — E118 Type 2 diabetes mellitus with unspecified complications: Secondary | ICD-10-CM | POA: Diagnosis not present

## 2024-04-26 DIAGNOSIS — E782 Mixed hyperlipidemia: Secondary | ICD-10-CM | POA: Diagnosis not present

## 2024-04-26 DIAGNOSIS — D839 Common variable immunodeficiency, unspecified: Secondary | ICD-10-CM | POA: Diagnosis not present

## 2024-04-26 DIAGNOSIS — J4550 Severe persistent asthma, uncomplicated: Secondary | ICD-10-CM | POA: Diagnosis not present

## 2024-04-26 LAB — LAB REPORT - SCANNED
Albumin, Urine POC: 5.8
Albumin/Creatinine Ratio, Urine, POC: 7
Creatinine, POC: 79.4 mg/dL
EGFR (Non-African Amer.): 93
Free T4: 1.23 ng/dL
TSH: 0.539

## 2024-04-26 MED ORDER — AMOXICILLIN 500 MG PO TABS: 500.0000 mg | ORAL_TABLET | Freq: Three times a day (TID) | ORAL | 0 refills | Status: DC

## 2024-04-27 ENCOUNTER — Encounter: Payer: Self-pay | Admitting: Family Medicine

## 2024-04-27 DIAGNOSIS — Z720 Tobacco use: Secondary | ICD-10-CM | POA: Insufficient documentation

## 2024-04-29 DIAGNOSIS — Z789 Other specified health status: Secondary | ICD-10-CM | POA: Diagnosis not present

## 2024-04-29 DIAGNOSIS — L988 Other specified disorders of the skin and subcutaneous tissue: Secondary | ICD-10-CM | POA: Diagnosis not present

## 2024-04-29 DIAGNOSIS — D225 Melanocytic nevi of trunk: Secondary | ICD-10-CM | POA: Diagnosis not present

## 2024-04-30 ENCOUNTER — Ambulatory Visit (HOSPITAL_COMMUNITY): Admitting: Clinical

## 2024-04-30 LAB — CELIAC HLA RFLX TO ABS
DQ2 (DQA1 0501/0505,DQB1 02XX): NEGATIVE
DQ8 (DQA1 03XX, DQB1 0302): NEGATIVE

## 2024-05-01 ENCOUNTER — Encounter (HOSPITAL_COMMUNITY): Payer: Self-pay

## 2024-05-01 ENCOUNTER — Ambulatory Visit (INDEPENDENT_AMBULATORY_CARE_PROVIDER_SITE_OTHER): Admitting: Internal Medicine

## 2024-05-01 ENCOUNTER — Encounter (HOSPITAL_COMMUNITY)

## 2024-05-01 ENCOUNTER — Other Ambulatory Visit: Payer: Self-pay

## 2024-05-01 ENCOUNTER — Telehealth: Payer: Self-pay | Admitting: Pulmonary Disease

## 2024-05-01 ENCOUNTER — Encounter: Payer: Self-pay | Admitting: Internal Medicine

## 2024-05-01 ENCOUNTER — Encounter (HOSPITAL_COMMUNITY): Payer: Self-pay | Admitting: Emergency Medicine

## 2024-05-01 ENCOUNTER — Emergency Department (HOSPITAL_COMMUNITY)
Admission: EM | Admit: 2024-05-01 | Discharge: 2024-05-01 | Disposition: A | Discharge status: Home / Self Care | Source: Other Acute Inpatient Hospital | Attending: Emergency Medicine | Admitting: Emergency Medicine

## 2024-05-01 VITALS — BP 146/82 | HR 83 | Ht 64.0 in | Wt 169.0 lb

## 2024-05-01 DIAGNOSIS — T8131XA Disruption of external operation (surgical) wound, not elsewhere classified, initial encounter: Secondary | ICD-10-CM | POA: Diagnosis not present

## 2024-05-01 DIAGNOSIS — Z7901 Long term (current) use of anticoagulants: Secondary | ICD-10-CM | POA: Diagnosis not present

## 2024-05-01 DIAGNOSIS — J4489 Other specified chronic obstructive pulmonary disease: Secondary | ICD-10-CM

## 2024-05-01 DIAGNOSIS — Z794 Long term (current) use of insulin: Secondary | ICD-10-CM | POA: Diagnosis not present

## 2024-05-01 DIAGNOSIS — Z1231 Encounter for screening mammogram for malignant neoplasm of breast: Secondary | ICD-10-CM

## 2024-05-01 DIAGNOSIS — Y831 Surgical operation with implant of artificial internal device as the cause of abnormal reaction of the patient, or of later complication, without mention of misadventure at the time of the procedure: Secondary | ICD-10-CM | POA: Diagnosis not present

## 2024-05-01 DIAGNOSIS — T8130XA Disruption of wound, unspecified, initial encounter: Secondary | ICD-10-CM

## 2024-05-01 MED ORDER — BUDESONIDE-FORMOTEROL FUMARATE 160-4.5 MCG/ACT IN AERO
INHALATION_SPRAY | RESPIRATORY_TRACT | 12 refills | Status: DC
Start: 2024-05-01 — End: 2024-07-25

## 2024-05-01 NOTE — Discharge Instructions (Signed)
 Follow with your md as needed

## 2024-05-01 NOTE — ED Triage Notes (Signed)
 Pt had a mole removed w/  stiches placed x2 days ago to her right side. Pt said stiches came out yesterday and has been bleeding since.

## 2024-05-01 NOTE — Progress Notes (Unsigned)
 Laura [name], female    DOB: 1960/11/26    MRN: [id number]   Brief patient profile:  89  yowf  2008  former Jude pt self-referred back to pulmonary clinic in [facility]  05/01/2024  for asthma    CT chest 03/2022 clear CTA chest 03/2021 >> RT hilar LN 13 mm, no nodule   CTA 09/18/19 - No pulmonary embolism. Nodule ~67mm in right lung base. Minimal right pleural effusion.   04/2021 esophagram >> small hiatal hernia   09/2022 Npsg  (210 lbs ) no  significant OSA.  Showed limb movements   NPSG 11/06/2018  AHI of 11.4/h and RDI of 11.7/h.  HST 03/2010 >> 257 lbs -  AHI 14/h   PFT: 02/2022 ratio 73, FEV1 75%, FVC 81% 11/2021 ratio 75, FEV1 78%, FVC 82% 07/2021 ratio 72, FEV1 67%, FVC 71% 04/2021 Spirometry ratio 70, FEV1 1.60/64%, FVC 2.8/71%   11/2020  Spirometry:  (FEV1: 1.61/64%, FVC: 2.14/66%, FEV1/FVC: 75%).   06/21/19 Spirometry FVC 2.2 (70%) FEV1 1.5 (64%) Ratio 68  Interpretation: Moderate obstructive lung defect is present   History of Present Illness  05/01/2024  Pulmonary/ 1st [name] eval/ Laura [name] / Laura [name]  on Laura [name] started  June 2025 and maint on BREO / Montlukast last pred x months prior to Laura [name] Complaint  Patient presents with   Follow-up   Asthma    TOC  Dyspnea:  walks 10 m and stops  -  shops [employer] leaning on cart  Cough: minimal green mucus / slt nasal > amox one week Sleep: fine is flat one pillow  SABA use: avg levo  3-4 x day  02 ldz:wnwz     No obvious day to day or daytime pattern/variability or assoc excess/ purulent sputum or mucus plugs or hemoptysis or cp or chest tightness, subjective wheeze or overt sinus or hb symptoms.    Also denies any obvious fluctuation of symptoms with weather or environmental changes or other aggravating or alleviating factors except as outlined above   No unusual exposure hx or h/o childhood pna/ asthma or knowledge of premature birth.  Current Allergies, Complete Past Medical History, Past Surgical  History, Family History, and Social History were reviewed in Laura [name] record.  ROS  The following are not active complaints unless bolded Hoarseness, sore throat, dysphagia= globus sensation, dental problems, itching, sneezing,  nasal congestion or discharge of excess mucus or purulent secretions, ear ache,   fever, chills, sweats, unintended wt loss or wt gain, classically pleuritic or exertional cp,  orthopnea pnd or arm/hand swelling  or leg swelling, presyncope, palpitations, abdominal pain, anorexia, nausea, vomiting, diarrhea  or change in bowel habits or change in bladder habits, change in stools or change in urine, dysuria, hematuria,  rash, arthralgias, visual complaints, headache, numbness, weakness or ataxia or problems with walking or coordination,  change in mood or  memory.            Outpatient Medications Prior to Visit  Medication Sig Dispense Refill   amoxicillin  (AMOXIL ) 500 MG tablet Take 1 tablet (500 mg total) by mouth 3 (three) times daily. 15 tablet 0   apixaban  (ELIQUIS ) 5 MG TABS tablet TAKE 1 TABLET BY MOUTH TWICE  DAILY 200 tablet 1   atorvastatin  (LIPITOR) 20 MG tablet Take 20 mg by mouth at bedtime.     Azelastine  HCl 137 MCG/SPRAY SOLN Place 1 spray into both nostrils daily. 90 mL 1   BD PEN NEEDLE NANO  2ND GEN 32G X 4 MM MISC 1 each by Other route in the morning, at noon, in the evening, and at bedtime. 90 each 0   bisoprolol  (ZEBETA ) 10 MG tablet Take 1 tablet (10 mg total) by mouth daily. 90 tablet 3   blood glucose meter kit and supplies 1 each by Other route 4 (four) times daily. Dispense based on patient and insurance preference. Use up to four times daily as directed. (FOR ICD-10 E10.9, E11.9). 1 each 0   budesonide  (PULMICORT ) 0.5 MG/2ML nebulizer solution Take 0.5 mg via nebulizer twice a day for the next 1-2 weeks or until cough and wheeze free, then stop 100 mL 0   chlorhexidine  (HIBICLENS ) 4 % external liquid Apply topically daily  as needed. 236 mL 0   Cholecalciferol (VITAMIN D3) 125 MCG (5000 UT) CAPS Take 1 capsule (5,000 Units total) by mouth daily. 90 capsule 0   Continuous Glucose Sensor (FREESTYLE LIBRE 3 SENSOR) MISC 1 Piece by Does not apply route every 14 (fourteen) days. Place 1 sensor on the skin every 14 days. Use to check glucose continuously 6 each 1   diltiazem  (CARTIA  XT) 300 MG 24 hr capsule Take 1 capsule (300 mg total) by mouth daily. 100 capsule 2   divalproex  (DEPAKOTE  ER) 500 MG 24 hr tablet Take 1 tablet (500 mg total) by mouth daily AND 2 tablets (1,000 mg total) at bedtime. 270 tablet 0   ELDERBERRY PO Take 4 g by mouth daily. Gummies 2g each     EPINEPHrine  0.3 mg/0.3 mL IJ SOAJ injection Inject 0.3 mg into the muscle once as needed for anaphylaxis.     FERROUS SULFATE  PO Take 65 mg by mouth every evening.     fluticasone  furoate-vilanterol (BREO ELLIPTA ) 100-25 MCG/ACT AEPB Inhale 1 puff into the lungs daily. 60 each 5   gabapentin  (NEURONTIN ) 600 MG tablet Take 600 mg by mouth 3 (three) times daily.     HYDROcodone -acetaminophen  (NORCO/VICODIN) 5-325 MG tablet Take 1 tablet by mouth every 6 (six) hours as needed. 20 tablet 0   hydrOXYzine  (ATARAX ) 25 MG tablet TAKE ONE TABLET BY MOUTH THREE TIMES DAILY AS NEEDED FOR ANXIETY OR FOR ITCHING 30 tablet 1   Immune Globulin , Human, (CUVITRU  ) Inject 27 mg into the skin every 14 (fourteen) days.     insulin  glargine (LANTUS  SOLOSTAR) 100 UNIT/ML Solostar Pen Inject 20 Units into the skin at bedtime. 30 mL 0   Insulin  Pen Needle (PEN NEEDLES 3/16) 31G X 5 MM MISC Use as directed with insulin  pen 100 each 0   JARDIANCE  10 MG TABS tablet TAKE 1 TABLET BY MOUTH DAILY  BEFORE BREAKFAST 100 tablet 2   lansoprazole  (PREVACID ) 30 MG capsule Take 1 capsule (30 mg total) by mouth 2 (two) times daily before a meal. 180 capsule 3   levalbuterol  (XOPENEX  HFA) 45 MCG/ACT inhaler USE 2 INHALATIONS BY MOUTH EVERY 6 HOURS AS NEEDED FOR WHEEZING 30 g 6   levalbuterol   (XOPENEX ) 1.25 MG/3ML nebulizer solution Use 1 vial in the nebulizer every 6 hours as needed for cough, wheeze, shortness of breath or chest tightness. 225 mL 1   levothyroxine  (SYNTHROID , LEVOTHROID) 112 MCG tablet Take 112 mcg by mouth daily before breakfast.     linaclotide  (LINZESS ) 290 MCG CAPS capsule Take 1 capsule (290 mcg total) by mouth daily before breakfast. 90 capsule 3   losartan  (COZAAR ) 50 MG tablet Take 50 mg by mouth every morning.     MELATONIN GUMMIES  PO Take 10 mg by mouth at bedtime.     metFORMIN  (GLUCOPHAGE ) 500 MG tablet Take 500 mg by mouth 2 (two) times daily.     montelukast  (SINGULAIR ) 10 MG tablet TAKE 1 TABLET BY MOUTH AT  BEDTIME 100 tablet 1   Multiple Vitamins-Minerals (HAIR SKIN & NAILS PO) Take 1 tablet by mouth daily.     Multiple Vitamins-Minerals (MULTIVITAMIN WITH MINERALS) tablet Take 1 tablet by mouth daily. Woman     mupirocin  ointment (BACTROBAN ) 2 % Apply 1 Application topically 2 (two) times daily. 60 g 0   Nebulizer MISC 1 Device by Other route as directed. Nebulizer tubing kit 1 each 1   ondansetron  (ZOFRAN -ODT) 8 MG disintegrating tablet Take 8 mg by mouth daily as needed for vomiting or nausea.     ONETOUCH ULTRA test strip 1 each by Other route in the morning, at noon, in the evening, and at bedtime.     polyethylene glycol (MIRALAX  / GLYCOLAX ) 17 g packet Take 17 g by mouth daily as needed. 14 each 0   Potassium Chloride  ER 20 MEQ TBCR Take 20 mEq by mouth daily.     sertraline  (ZOLOFT ) 50 MG tablet Take 1 tablet (50 mg total) by mouth at bedtime. 90 tablet 0   Spacer/Aero-Holding Chambers DEVI 1 Device by Does not apply route as directed. 1 each 1   Tezepelumab -ekko (TEZSPIRE ) 210 MG/1. SOAJ Inject 210 mg into the skin every 28 (twenty-eight) days. 1.91 mL 11   topiramate  (TOPAMAX ) 100 MG tablet TAKE 2  TABLETS BY MOUTH  IN THE MORNING AND 1 TABLET IN  THE EVENING 450 tablet 3   TRULICITY  4.5 MG/0.5ML SOAJ INJECT THE CONTENTS OF ONE PEN   SUBCUTANEOUSLY WEEKLY AS  DIRECTED 6 mL 3   Facility-Administered Medications Prior to Visit  Medication Dose Route Frequency Provider Last Rate Last Admin   tezepelumab -ekko (TEZSPIRE ) 210 MG/1. syringe 210 mg  210 mg Subcutaneous Q28 days Iva Marty Saltness, MD   210 mg at 04/01/24 9060    Past Medical History:  Diagnosis Date   Anemia    Aortic atherosclerosis (HCC)    Asthma    Atrial fibrillation and flutter (HCC)    a.) CHA2DS2VASc = 6 (sex, HTN, TIA x2, vascular disease history, T2DM);  b.) DCCV ~ 2009; c.) DCCV 08/29/2018 (200J x1 --> SR with 1st degree AVB); d.) rate/rhythm maintained on oral diltiazem  + bisoprolol ; chronically anticoagulated with apixaban    Bipolar disorder (HCC)    Carpal tunnel syndrome of right wrist    CHB (complete heart block) (HCC)    a.) s/p St. Jude dual chamber PPM placement   CHF (congestive heart failure) (HCC)    Complication of anesthesia    a.) allergic reaction to perioperative analgesics + anesthetic medications in [place] ; unsure of combination; not able to recount reactions/events --> states I ended up in the ICU for 5 days   Constipation    a.) on linaclotide    COPD (chronic obstructive pulmonary disease) (HCC)    DDD (degenerative disc disease), thoracic    GERD (gastroesophageal reflux disease)    H/O congenital atrial septal defect (ASD) repair    Hallux valgus of right foot    Hammertoe of second toe of right foot    History of 2019 novel coronavirus disease (COVID-19)    a.) 10/2020; b.) 05/2021   HLD (hyperlipidemia)    HTN (hypertension)    Hypogammaglobulinemia (HCC)    a.) on Curitru   Hypothyroid  IgA deficiency (HCC)    IgE deficiency (HCC)    Long term current use of anticoagulant    a.) apixaban    Lumbar stenosis    Migraines    Mitral stenosis 02/08/2022   a.) TTE 02/08/2022: EF 60-65%, mild MAC, mild MV stenosis (peak grad 14.6 / mean grad 4.0)   Neuropathy    NSVT (nonsustained ventricular  tachycardia) (HCC) 10/31/2018   a.) Holter 10/31/2018: 6 beat run   Obesity    OSA on CPAP    Osteoarthritis    Presence of permanent cardiac 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 Jude dual chamber device; placed for symptomatic bradycardia secondary to intermittent CHB   PTSD (post-traumatic stress disorder)    Pulmonary nodules    Recurrent sinusitis    Short-term memory loss    Sleep difficulties    a.) takes melatonin   Symptomatic bradycardia    a.) s/p St. Jude dual chamber PPM placement   Syncope    TIA (transient ischemic attack)    x3-last one in 2015   Tremors of nervous system    Type 2 diabetes mellitus (HCC)       Objective:     BP (!) 146/82   Pulse 83   Ht 5' 4 (1.626 m)   Wt 169 lb (76.7 kg)   SpO2 97% Comment: ra  BMI 29.01 kg/m   SpO2: 97 % (ra)  Wt Readings from Last 3 Encounters:  05/01/24 169 lb (76.7 kg)  05/01/24 171 lb 15.3 oz (78 kg)  04/10/24 172 lb 12.8 oz (78.4 kg)     General appearance:    talkative wf with upper airway wheeze  and slt gruff voice    HEENT : Oropharynx  clear         NECK :  without  apparent JVD/ palpable Nodes/TM    LUNGS: no acc muscle use,  Nl contour chest which is clear to A and P bilaterally without cough on insp or exp maneuvers   CV:  RRR  no s3 or murmur or increase in P2, and no edema   ABD:  soft and nontender   MS:  Gait nl   ext warm without deformities Or obvious joint restrictions  calf tenderness, cyanosis or clubbing    SKIN: warm and dry without lesions    NEURO:  alert, approp, nl sensorium with  no motor or cerebellar deficits apparent.     I personally reviewed images and agree with radiology impression as follows:  CXR:   portable 02/18/24 No acute cardiopulmonary disease.      Assessment   No problem-specific Assessment & Plan notes found for this encounter.     Ozell America, MD 05/01/2024

## 2024-05-01 NOTE — Patient Instructions (Addendum)
 Plan A = Automatic = Always=   Replace the Breo with Symbicort  160 Take 2 puffs first thing in am and then another 2 puffs about 12 hours later and hold off the budesonide  for now   Work on inhaler technique:  relax and gently blow all the way out then take a nice smooth full deep breath back in, triggering the inhaler at same time you start breathing in.  Hold breath in for at least  5 seconds if you can. Blow out symbicort   thru nose. Rinse and gargle with water when done.  If mouth or throat bother you at all,  try brushing teeth/gums/tongue with arm and hammer toothpaste/ make a slurry and gargle and spit out.      Plan B = Backup (to supplement plan A, not to replace it) Use your levoalbuterol inhaler as a rescue medication to be used if you can't catch your breath by resting or slowing your pace  or doing a relaxed purse lip breathing pattern.  - The less you use it, the better it will work when you need it. - Ok to use the inhaler up to 2 puffs  every 4 hours if you must but call for appointment if use goes up over your usual need - Don't leave home without it !!  (think of it like the spare tire or starter fluid for your car)   Plan C = Crisis (instead of Plan B but only if Plan B stops working) - only use your albuterol  nebulizer if you first try Plan B and it fails to help > ok to use the nebulizer up to every 4 hours but if start needing it regularly call for immediate appointment   We will have you return to sleep medicine NP here ([place] office is alternative)  Please schedule a follow up visit in 3 months but call sooner if needed  with all repiratory medications /inhalers/ solutions in hand so we can verify exactly what you are taking. This includes all medications from all doctors and over the counters

## 2024-05-01 NOTE — Telephone Encounter (Signed)
 Patient requests TOC  to [place] for her Sleep Apnea (patient lives in [place])

## 2024-05-02 DIAGNOSIS — L989 Disorder of the skin and subcutaneous tissue, unspecified: Secondary | ICD-10-CM | POA: Diagnosis not present

## 2024-05-02 DIAGNOSIS — I110 Hypertensive heart disease with heart failure: Secondary | ICD-10-CM | POA: Diagnosis not present

## 2024-05-02 DIAGNOSIS — I5032 Chronic diastolic (congestive) heart failure: Secondary | ICD-10-CM | POA: Diagnosis not present

## 2024-05-02 DIAGNOSIS — E1165 Type 2 diabetes mellitus with hyperglycemia: Secondary | ICD-10-CM | POA: Diagnosis not present

## 2024-05-02 DIAGNOSIS — I10 Essential (primary) hypertension: Secondary | ICD-10-CM | POA: Diagnosis not present

## 2024-05-02 DIAGNOSIS — K219 Gastro-esophageal reflux disease without esophagitis: Secondary | ICD-10-CM | POA: Diagnosis not present

## 2024-05-02 DIAGNOSIS — D696 Thrombocytopenia, unspecified: Secondary | ICD-10-CM | POA: Diagnosis not present

## 2024-05-02 DIAGNOSIS — E1142 Type 2 diabetes mellitus with diabetic polyneuropathy: Secondary | ICD-10-CM | POA: Diagnosis not present

## 2024-05-02 DIAGNOSIS — E1169 Type 2 diabetes mellitus with other specified complication: Secondary | ICD-10-CM | POA: Diagnosis not present

## 2024-05-02 DIAGNOSIS — K5900 Constipation, unspecified: Secondary | ICD-10-CM | POA: Diagnosis not present

## 2024-05-02 NOTE — Assessment & Plan Note (Addendum)
 Quit smoking 2008 s need for inhalers at that point  - failed fosenra so started on Tespire 03/2024 by Allergy  - Spirometry 04/26/24   FEV1 1.66 (73%)  Ratio 0.67  and no curvature on exp and flat truncated insp curve   - 05/01/2024  After extensive coaching inhaler device,  effectiveness =    80% on hfa s spacer> symb 160 trial in place of advair   DDX of  difficult airways management almost all start with A and  include Adherence, Ace Inhibitors, Acid Reflux, Active Sinus Disease, Alpha 1 Antitripsin deficiency, Anxiety masquerading as Airways dz,  ABPA,  Allergy(esp in young), Aspiration (esp in elderly), Adverse effects of meds,  Active smoking or vaping, A bunch of PE's (a small clot burden can't cause this syndrome unless there is already severe underlying pulm or vascular dz with poor reserve) plus two Bs  = Bronchiectasis and Beta blocker use..and one C= CHF   Adherence is always the initial prime suspect and is a multilayered concern that requires a trust but verify approach in every patient - starting with knowing how to use medications, especially inhalers, correctly, keeping up with refills and understanding the fundamental difference between maintenance and prns vs those medications only taken for a very short course and then stopped and not refilled.  - see hfa teaching  - return with all meds in hand using a trust but verify approach to confirm accurate Medication  Reconciliation The principal here is that until we are certain that the  patients are doing what we've asked, it makes no sense to ask them to do more.   ? Acid (or non-acid) GERD > always difficult to exclude as up to 75% of pts in some series report no assoc GI/ Heartburn symptoms> rec continue max (24h)  acid suppression and diet restrictions/ reviewed     ? Adverse drug effect > D/c DPIs esp with flattening of insp curve in mind   Allergy > continuing tespire/ singulair  and high dose ICS   ? Anxiety/VCD > usually at  the bottom of this list of usual suspects but  note already on psychotropics and may interfere with adherence and also interpretation of response or lack thereof to symptom management which can be quite subjective.   Return in 3 m, sooner prn         Each maintenance medication was reviewed in detail including emphasizing most importantly the difference between maintenance and prns and under what circumstances the prns are to be triggered using an action plan format where appropriate.  Total time for H and P, chart review, counseling, reviewing hfa device(s) and generating customized AVS unique to this office visit / same day charting = 47 min complex  new pt eval

## 2024-05-05 NOTE — ED Provider Notes (Signed)
 [place] EMERGENCY DEPARTMENT AT [facility] Provider Note   CSN: [id number] Arrival date & time: 05/01/24  1006     Patient presents with: Wound Dehiscence   Laura [name] is a 63 y.o. female.   Patient had a recent dermatological procedure done on her torso.  She comes in today because she is having bleeding from this incision site  The history is provided by the patient and medical records. No language interpreter was used.  Wound Check This is a new problem. The current episode started 6 to 12 hours ago. The problem occurs constantly. Pertinent negatives include no chest pain, no abdominal pain and no headaches. Nothing aggravates the symptoms.       Prior to Admission medications   Medication Sig Start Date End Date Taking? Authorizing Provider  amoxicillin  (AMOXIL ) 500 MG tablet Take 1 tablet (500 mg total) by mouth 3 (three) times daily. 04/26/24   Cari Arlean HERO, FNP  apixaban  (ELIQUIS ) 5 MG TABS tablet TAKE 1 TABLET BY MOUTH TWICE  DAILY 01/01/24   Alvan Dorn FALCON, MD  atorvastatin  (LIPITOR) 20 MG tablet Take 20 mg by mouth at bedtime. 12/26/22   [provider]  Azelastine  HCl 137 MCG/SPRAY SOLN Place 1 spray into both nostrils daily. 10/25/23   Iva Marty Saltness, MD  BD PEN NEEDLE NANO 2ND GEN 32G X 4 MM MISC 1 each by Other route in the morning, at noon, in the evening, and at bedtime. 05/16/22   Nida, Gebreselassie W, MD  bisoprolol  (ZEBETA ) 10 MG tablet Take 1 tablet (10 mg total) by mouth daily. 08/16/23   Alvan Dorn FALCON, MD  blood glucose meter kit and supplies 1 each by Other route 4 (four) times daily. Dispense based on patient and insurance preference. Use up to four times daily as directed. (FOR ICD-10 E10.9, E11.9). 01/27/21   Nida, Gebreselassie W, MD  budesonide -formoterol  (SYMBICORT ) 160-4.5 MCG/ACT inhaler Take 2 puffs first thing in am and then another 2 puffs about 12 hours later. 05/01/24   Darlean Ozell NOVAK, MD  chlorhexidine   (HIBICLENS ) 4 % external liquid Apply topically daily as needed. 03/05/24   Stuart Vernell Norris, PA-C  Cholecalciferol (VITAMIN D3) 125 MCG (5000 UT) CAPS Take 1 capsule (5,000 Units total) by mouth daily. 11/25/20   Nida, Gebreselassie W, MD  Continuous Glucose Sensor (FREESTYLE LIBRE 3 SENSOR) MISC 1 Piece by Does not apply route every 14 (fourteen) days. Place 1 sensor on the skin every 14 days. Use to check glucose continuously 04/07/23   Nida, Gebreselassie W, MD  diltiazem  (CARTIA  XT) 300 MG 24 hr capsule Take 1 capsule (300 mg total) by mouth daily. 04/26/24   Alvan Dorn FALCON, MD  divalproex  (DEPAKOTE  ER) 500 MG 24 hr tablet Take 1 tablet (500 mg total) by mouth daily AND 2 tablets (1,000 mg total) at bedtime. 02/10/24 05/10/24  Vickey Mettle, MD  ELDERBERRY PO Take 4 g by mouth daily. Gummies 2g each    [provider]  EPINEPHrine  0.3 mg/0.3 mL IJ SOAJ injection Inject 0.3 mg into the muscle once as needed for anaphylaxis. 01/17/20   [provider]  FERROUS SULFATE  PO Take 65 mg by mouth every evening.    [provider]  gabapentin  (NEURONTIN ) 600 MG tablet Take 600 mg by mouth 3 (three) times daily. 07/20/21   [provider]  HYDROcodone -acetaminophen  (NORCO/VICODIN) 5-325 MG tablet Take 1 tablet by mouth every 6 (six) hours as needed. 02/18/24   Suzette Pac, MD  hydrOXYzine  (ATARAX ) 25 MG tablet TAKE ONE TABLET BY MOUTH THREE TIMES DAILY AS NEEDED FOR ANXIETY OR FOR ITCHING 03/10/23   Iva Marty Saltness, MD  Immune Globulin , Human, (CUVITRU  [facility]) Inject 27 mg into the skin every 14 (fourteen) days. 12/24/21   [provider]  insulin  glargine (LANTUS  SOLOSTAR) 100 UNIT/ML Solostar Pen Inject 20 Units into the skin at bedtime. 04/26/24   Nida, Gebreselassie W, MD  Insulin  Pen Needle (PEN NEEDLES 3/16) 31G X 5 MM MISC Use as directed with insulin  pen 10/21/21   Nida, Gebreselassie W, MD  JARDIANCE  10 MG TABS tablet TAKE 1 TABLET BY MOUTH DAILY   BEFORE BREAKFAST 03/27/24   Nida, Gebreselassie W, MD  lansoprazole  (PREVACID ) 30 MG capsule Take 1 capsule (30 mg total) by mouth 2 (two) times daily before a meal. 01/15/24   Ezzard Sonny RAMAN, PA-C  levalbuterol  (XOPENEX  HFA) 45 MCG/ACT inhaler USE 2 INHALATIONS BY MOUTH EVERY 6 HOURS AS NEEDED FOR WHEEZING 11/14/23   Iva Marty Saltness, MD  levalbuterol  (XOPENEX ) 1.25 MG/3ML nebulizer solution Use 1 vial in the nebulizer every 6 hours as needed for cough, wheeze, shortness of breath or chest tightness. 11/03/23   Iva Marty Saltness, MD  levothyroxine  (SYNTHROID , LEVOTHROID) 112 MCG tablet Take 112 mcg by mouth daily before breakfast.    [provider]  linaclotide  (LINZESS ) 290 MCG CAPS capsule Take 1 capsule (290 mcg total) by mouth daily before breakfast. 10/24/23   Ezzard Sonny RAMAN, PA-C  losartan  (COZAAR ) 50 MG tablet Take 50 mg by mouth every morning.    [provider]  MELATONIN GUMMIES PO Take 10 mg by mouth at bedtime.    [provider]  metFORMIN  (GLUCOPHAGE ) 500 MG tablet Take 500 mg by mouth 2 (two) times daily. 07/21/21   [provider]  montelukast  (SINGULAIR ) 10 MG tablet TAKE 1 TABLET BY MOUTH AT  BEDTIME 01/01/24   Iva Marty Saltness, MD  Multiple Vitamins-Minerals (HAIR SKIN & NAILS PO) Take 1 tablet by mouth daily.    [provider]  Multiple Vitamins-Minerals (MULTIVITAMIN WITH MINERALS) tablet Take 1 tablet by mouth daily. Woman    [provider]  mupirocin  ointment (BACTROBAN ) 2 % Apply 1 Application topically 2 (two) times daily. 03/05/24   Stuart Vernell Norris, PA-C  Nebulizer MISC 1 Device by Other route as directed. Nebulizer tubing kit 10/25/23   Iva Marty Saltness, MD  ondansetron  (ZOFRAN -ODT) 8 MG disintegrating tablet Take 8 mg by mouth daily as needed for vomiting or nausea.    [provider]  [facility] ULTRA test strip 1 each by Other route in the morning, at noon, in the evening, and at bedtime.  10/17/19   [provider]  polyethylene glycol (MIRALAX  / GLYCOLAX ) 17 g packet Take 17 g by mouth daily as needed. 04/17/21   [place], Camellia PARAS, DO  Potassium Chloride  ER 20 MEQ TBCR Take 20 mEq by mouth daily. 01/04/21   [provider]  sertraline  (ZOLOFT ) 50 MG tablet Take 1 tablet (50 mg total) by mouth at bedtime. 01/22/24 05/01/24  Vickey Mettle, MD  Spacer/Aero-Holding Chambers DEVI 1 Device by Does not apply route as directed. 10/25/23   Iva Marty Saltness, MD  Tezepelumab -ekko (TEZSPIRE ) 210 MG/1. SOAJ Inject 210 mg into the skin every 28 (twenty-eight) days. 02/28/24   Ambs, Arlean HERO, FNP  topiramate  (TOPAMAX ) 100 MG tablet TAKE 2  TABLETS BY MOUTH  IN THE MORNING AND 1 TABLET IN  THE EVENING 01/29/24  Wertman, Sara E, PA-C  TRULICITY  4.5 MG/0.5ML SOAJ INJECT THE CONTENTS OF ONE PEN  SUBCUTANEOUSLY WEEKLY AS  DIRECTED 01/04/24   Nida, Gebreselassie W, MD    Allergies: Alpha-gal, Ativan [lorazepam], and Phenergan [promethazine hcl]    Review of Systems  Constitutional:  Negative for appetite change and fatigue.  HENT:  Negative for congestion, ear discharge and sinus pressure.   Eyes:  Negative for discharge.  Respiratory:  Negative for cough.   Cardiovascular:  Negative for chest pain.  Gastrointestinal:  Negative for abdominal pain and diarrhea.       Bleeding from incision to abdomen  Genitourinary:  Negative for frequency and hematuria.  Musculoskeletal:  Negative for back pain.  Skin:  Negative for rash.  Neurological:  Negative for seizures and headaches.  Psychiatric/Behavioral:  Negative for hallucinations.     Updated Vital Signs BP (!) 140/70 (BP Location: Right Arm)   Pulse 63   Temp 98.1 F (36.7 C) (Oral)   Resp 16   Ht 5' 4 (1.626 m)   Wt 78 kg   SpO2 99%   BMI 29.52 kg/m   Physical Exam Vitals and nursing note reviewed.  Constitutional:      Appearance: She is well-developed.  HENT:     Head: Normocephalic.     Nose: Nose normal.   Eyes:     General: No scleral icterus.    Conjunctiva/sclera: Conjunctivae normal.  Neck:     Thyroid : No thyromegaly.  Cardiovascular:     Rate and Rhythm: Normal rate and regular rhythm.     Heart sounds: No murmur heard.    No friction rub. No gallop.  Pulmonary:     Breath sounds: No stridor. No wheezing or rales.  Chest:     Chest wall: No tenderness.  Abdominal:     General: There is no distension.     Tenderness: There is no abdominal tenderness. There is no rebound.     Comments: Patient with a few staples from dermatological resection on her right abdomen.  Patient having minimal bleeding between the staples  Musculoskeletal:        General: Normal range of motion.     Cervical back: Neck supple.  Lymphadenopathy:     Cervical: No cervical adenopathy.  Skin:    Findings: No erythema or rash.  Neurological:     Mental Status: She is alert and oriented to person, place, and time.     Motor: No abnormal muscle tone.     Coordination: Coordination normal.  Psychiatric:        Behavior: Behavior normal.     (all labs ordered are listed, but only abnormal results are displayed) Labs Reviewed - No data to display  EKG: None  Radiology: No results found.   Procedures   Medications Ordered in the ED - No data to display                                  Medical Decision Making  Appropriately healing wound to abdomen.  Patient will follow-up with her dermatologist as planned     Final diagnoses:  None    ED Discharge Orders     None          Suzette Pac, MD 05/05/24 1202

## 2024-05-06 LAB — GENECONNECT MOLECULAR SCREEN: Genetic Analysis Overall Interpretation: NEGATIVE

## 2024-05-07 NOTE — Progress Notes (Signed)
 Patient is on recall for ov in 4 months with Dr Cindie

## 2024-05-09 DIAGNOSIS — D839 Common variable immunodeficiency, unspecified: Secondary | ICD-10-CM | POA: Diagnosis not present

## 2024-05-14 DIAGNOSIS — D485 Neoplasm of uncertain behavior of skin: Secondary | ICD-10-CM | POA: Diagnosis not present

## 2024-05-14 DIAGNOSIS — L720 Epidermal cyst: Secondary | ICD-10-CM | POA: Diagnosis not present

## 2024-05-15 ENCOUNTER — Ambulatory Visit (HOSPITAL_COMMUNITY)
Admission: RE | Admit: 2024-05-15 | Discharge: 2024-05-15 | Disposition: A | Discharge status: Home / Self Care | Source: Ambulatory Visit | Attending: Internal Medicine | Admitting: Internal Medicine

## 2024-05-15 DIAGNOSIS — M170 Bilateral primary osteoarthritis of knee: Secondary | ICD-10-CM | POA: Diagnosis not present

## 2024-05-15 DIAGNOSIS — Z1231 Encounter for screening mammogram for malignant neoplasm of breast: Secondary | ICD-10-CM | POA: Insufficient documentation

## 2024-05-18 NOTE — Progress Notes (Unsigned)
 No show

## 2024-05-20 DIAGNOSIS — M170 Bilateral primary osteoarthritis of knee: Secondary | ICD-10-CM | POA: Diagnosis not present

## 2024-05-22 ENCOUNTER — Telehealth (INDEPENDENT_AMBULATORY_CARE_PROVIDER_SITE_OTHER): Payer: Self-pay | Admitting: Psychiatry

## 2024-05-22 ENCOUNTER — Telehealth: Payer: Self-pay | Admitting: Psychiatry

## 2024-05-22 DIAGNOSIS — Z91199 Patient's noncompliance with other medical treatment and regimen due to unspecified reason: Secondary | ICD-10-CM

## 2024-05-22 NOTE — Telephone Encounter (Signed)
 I understand. Please advise her that we can talk more at her next visit about her concerns regarding the other providers. Let me know if there are any additional concerns.

## 2024-05-22 NOTE — Telephone Encounter (Signed)
 Could you contact her to check in and see what questions she may have? Thanks.

## 2024-05-23 ENCOUNTER — Ambulatory Visit (INDEPENDENT_AMBULATORY_CARE_PROVIDER_SITE_OTHER): Admitting: Clinical

## 2024-05-23 DIAGNOSIS — F4310 Post-traumatic stress disorder, unspecified: Secondary | ICD-10-CM | POA: Diagnosis not present

## 2024-05-23 DIAGNOSIS — D839 Common variable immunodeficiency, unspecified: Secondary | ICD-10-CM | POA: Diagnosis not present

## 2024-05-23 NOTE — Progress Notes (Signed)
 Virtual Visit via Video Note  I connected with Laura [name] on 05/23/24 at  8:00 AM EDT by a video enabled telemedicine application and verified that I am speaking with the correct person using two identifiers.  Location: Patient: home Provider: office   I discussed the limitations of evaluation and management by telemedicine and the availability of in person appointments. The patient expressed understanding and agreed to proceed.   THERAPIST PROGRESS NOTE   Session Time: 8:00 AM-:8:45 AM   Participation Level: Active   Behavioral Response: CasualAlertDepressed   Type of Therapy: Individual Therapy   Treatment Goals addressed: Coping/ non-medication treatment therapy for PTSD.   Interventions: CBT, DBT, Solution Focused, Strength-based and Supportive   Summary: Laura [name] is a 63 y.o. female who presents with PTSD. The OPT therapist worked with the patient for her ongoing OPT treatment session. The OPT therapist utilized Motivational Interviewing to assist in creating therapeutic repore.The patient in the session was engaged and work in collaboration giving feedback about her triggers and symptoms over the past few weeks.  The patient spoke about recently missing her med management appointment and noted she is currently working to reschedule. The patient spoke about leaving for [place]  in 5 days and will be in [place]  for the next month.The OPT therapist utilized Cognitive Behavioral Therapy through cognitive restructuring as well as worked with the patient on coping strategies to assist in management of mental health symptoms. The OPT therapist worked with the patient looking at current external stressor and the impact of them.The patient has been utilizing her coping skills for Summer/Fall.The OPT therapist placed emphasis on the patient keeping all upcoming health appointments and adhering to ongoing health treatment recommendations. The OPT therapist reviewed all appointments  listed coming up in the patients MyChart    Suicidal/Homicidal: Nowithout intent/plan   Therapist Response:The OPT therapist worked with the patient for the patients scheduled session. The patient was engaged in her session and gave feedback in relation to triggers, symptoms, and behavior responses over the past few weeks. The patient spoke about upcoming plan to leave in 5 days to go to [place]  to visit with family and will be there for a month . The OPT therapist worked with the patient utilizing an in session Cognitive Behavioral Therapy exercise. The OPT therapist worked with the patient allowing a safe place to vent and talk about current stressors. The patient has continued to work with her physical health providers to manage physical health conditions. The patient spoke about her mood and interactions with family recently The patient spoke about looking forward to the upcoming change of environment in going on vacation to visit family in [place] .The OPT therapist will continue treatment work with the patient in her next scheduled session.   Plan: Return again in 2/3 weeks.   Diagnosis:      Axis I: Post Traumatic Stress Disorder                            Axis II: No diagnosis     Collaboration of Care: Overview with the patient of involvement in the Med Management program with Dr. Vickey.   Patient/Guardian was advised Release of Information must be obtained prior to any record release in order to collaborate their care with an outside provider. Patient/Guardian was advised if they have not already done so to contact the registration department to sign all necessary forms in order for us  to release information regarding their  care.    Consent: Patient/Guardian gives verbal consent for treatment and assignment of benefits for services provided during this visit. Patient/Guardian expressed understanding and agreed to proceed.      I discussed the assessment and treatment plan with the  patient. The patient was provided an opportunity to ask questions and all were answered. The patient agreed with the plan and demonstrated an understanding of the instructions.   The patient was advised to call back or seek an in-person evaluation if the symptoms worsen or if the condition fails to improve as anticipated.   I provided 45 minutes of non-face-to-face time during this encounter.   Jerel Pepper, LCSW    05/23/2024

## 2024-05-23 NOTE — Telephone Encounter (Signed)
 Called patient to inform of the message from the provider she voiced understanding

## 2024-05-31 ENCOUNTER — Ambulatory Visit (INDEPENDENT_AMBULATORY_CARE_PROVIDER_SITE_OTHER): Payer: Medicare Other

## 2024-05-31 DIAGNOSIS — I10 Essential (primary) hypertension: Secondary | ICD-10-CM | POA: Diagnosis not present

## 2024-05-31 DIAGNOSIS — X500XXA Overexertion from strenuous movement or load, initial encounter: Secondary | ICD-10-CM | POA: Diagnosis not present

## 2024-05-31 DIAGNOSIS — I495 Sick sinus syndrome: Secondary | ICD-10-CM | POA: Diagnosis not present

## 2024-05-31 DIAGNOSIS — M545 Low back pain, unspecified: Secondary | ICD-10-CM | POA: Diagnosis not present

## 2024-05-31 LAB — CUP PACEART REMOTE DEVICE CHECK
Battery Remaining Longevity: 93 mo
Battery Remaining Percentage: 80 %
Battery Voltage: 3.01 V
Brady Statistic AP VP Percent: 42 %
Brady Statistic AP VS Percent: 1 %
Brady Statistic AS VP Percent: 5.6 %
Brady Statistic AS VS Percent: 51 %
Brady Statistic RA Percent Paced: 43 %
Brady Statistic RV Percent Paced: 48 %
Date Time Interrogation Session: 20250819201217
Implantable Lead Connection Status: 753985
Implantable Lead Connection Status: 753985
Implantable Lead Implant Date: 20230525
Implantable Lead Implant Date: 20230525
Implantable Lead Location: 753859
Implantable Lead Location: 753860
Implantable Pulse Generator Implant Date: 20230525
Lead Channel Impedance Value: 460 Ohm
Lead Channel Impedance Value: 460 Ohm
Lead Channel Pacing Threshold Amplitude: 0.625 V
Lead Channel Pacing Threshold Amplitude: 1 V
Lead Channel Pacing Threshold Pulse Width: 0.5 ms
Lead Channel Pacing Threshold Pulse Width: 0.5 ms
Lead Channel Sensing Intrinsic Amplitude: 11.3 mV
Lead Channel Sensing Intrinsic Amplitude: 2.2 mV
Lead Channel Setting Pacing Amplitude: 0.875
Lead Channel Setting Pacing Amplitude: 2.5 V
Lead Channel Setting Pacing Pulse Width: 0.5 ms
Lead Channel Setting Sensing Sensitivity: 0.5 mV
Pulse Gen Model: 2272
Pulse Gen Serial Number: 8084453

## 2024-06-02 ENCOUNTER — Ambulatory Visit: Payer: Self-pay | Admitting: Internal Medicine

## 2024-06-06 DIAGNOSIS — D839 Common variable immunodeficiency, unspecified: Secondary | ICD-10-CM | POA: Diagnosis not present

## 2024-06-20 DIAGNOSIS — D839 Common variable immunodeficiency, unspecified: Secondary | ICD-10-CM | POA: Diagnosis not present

## 2024-06-27 NOTE — Progress Notes (Signed)
 Remote PPM Transmission

## 2024-06-30 ENCOUNTER — Other Ambulatory Visit: Payer: Self-pay | Admitting: Psychiatry

## 2024-06-30 DIAGNOSIS — F4310 Post-traumatic stress disorder, unspecified: Secondary | ICD-10-CM

## 2024-07-02 ENCOUNTER — Other Ambulatory Visit: Payer: Self-pay | Admitting: Cardiology

## 2024-07-02 ENCOUNTER — Other Ambulatory Visit: Payer: Self-pay | Admitting: Allergy & Immunology

## 2024-07-03 NOTE — Telephone Encounter (Signed)
 Prescription refill request for Eliquis  received. Indication:afib Last office visit:4/25 Scr:0.66  5/25 Age: 63 Weight:76.7  kg  Prescription refilled

## 2024-07-04 DIAGNOSIS — D839 Common variable immunodeficiency, unspecified: Secondary | ICD-10-CM | POA: Diagnosis not present
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
## 2024-07-09 DIAGNOSIS — L568 Other specified acute skin changes due to ultraviolet radiation: Secondary | ICD-10-CM | POA: Diagnosis not present

## 2024-07-09 DIAGNOSIS — D2271 Melanocytic nevi of right lower limb, including hip: Secondary | ICD-10-CM | POA: Diagnosis not present

## 2024-07-09 DIAGNOSIS — D485 Neoplasm of uncertain behavior of skin: Secondary | ICD-10-CM | POA: Diagnosis not present

## 2024-07-10 ENCOUNTER — Encounter: Payer: Self-pay | Admitting: Psychiatry

## 2024-07-10 ENCOUNTER — Other Ambulatory Visit (HOSPITAL_COMMUNITY)
Admission: RE | Admit: 2024-07-10 | Discharge: 2024-07-10 | Disposition: A | Discharge status: Home / Self Care | Source: Ambulatory Visit | Attending: Psychiatry | Admitting: Psychiatry

## 2024-07-10 ENCOUNTER — Telehealth (INDEPENDENT_AMBULATORY_CARE_PROVIDER_SITE_OTHER): Admitting: Psychiatry

## 2024-07-10 DIAGNOSIS — F4310 Post-traumatic stress disorder, unspecified: Secondary | ICD-10-CM | POA: Diagnosis not present

## 2024-07-10 DIAGNOSIS — I10 Essential (primary) hypertension: Secondary | ICD-10-CM | POA: Diagnosis not present

## 2024-07-10 DIAGNOSIS — K1379 Other lesions of oral mucosa: Secondary | ICD-10-CM | POA: Diagnosis not present

## 2024-07-10 DIAGNOSIS — Z5181 Encounter for therapeutic drug level monitoring: Secondary | ICD-10-CM | POA: Diagnosis not present

## 2024-07-10 DIAGNOSIS — L729 Follicular cyst of the skin and subcutaneous tissue, unspecified: Secondary | ICD-10-CM | POA: Diagnosis not present

## 2024-07-10 DIAGNOSIS — Z79899 Other long term (current) drug therapy: Secondary | ICD-10-CM

## 2024-07-10 DIAGNOSIS — Z7182 Exercise counseling: Secondary | ICD-10-CM | POA: Diagnosis not present

## 2024-07-10 DIAGNOSIS — Z8639 Personal history of other endocrine, nutritional and metabolic disease: Secondary | ICD-10-CM | POA: Diagnosis not present

## 2024-07-10 DIAGNOSIS — R519 Headache, unspecified: Secondary | ICD-10-CM | POA: Diagnosis not present

## 2024-07-10 DIAGNOSIS — Z713 Dietary counseling and surveillance: Secondary | ICD-10-CM | POA: Diagnosis not present

## 2024-07-10 DIAGNOSIS — F330 Major depressive disorder, recurrent, mild: Secondary | ICD-10-CM | POA: Diagnosis not present

## 2024-07-10 DIAGNOSIS — K219 Gastro-esophageal reflux disease without esophagitis: Secondary | ICD-10-CM | POA: Diagnosis not present

## 2024-07-10 LAB — HEPATIC FUNCTION PANEL
ALT: 13 U/L (ref 0–44)
AST: 17 U/L (ref 15–41)
Albumin: 4.4 g/dL (ref 3.5–5.0)
Alkaline Phosphatase: 60 U/L (ref 38–126)
Bilirubin, Direct: 0.2 mg/dL (ref 0.0–0.2)
Indirect Bilirubin: 0.3 mg/dL (ref 0.3–0.9)
Total Bilirubin: 0.4 mg/dL (ref 0.0–1.2)
Total Protein: 7.7 g/dL (ref 6.5–8.1)

## 2024-07-10 LAB — CBC
HCT: 44.8 % (ref 36.0–46.0)
Hemoglobin: 16.3 g/dL — ABNORMAL HIGH (ref 12.0–15.0)
MCH: 35.3 pg — ABNORMAL HIGH (ref 26.0–34.0)
MCHC: 36.4 g/dL — ABNORMAL HIGH (ref 30.0–36.0)
MCV: 97 fL (ref 80.0–100.0)
Platelets: 169 K/uL (ref 150–400)
RBC: 4.62 MIL/uL (ref 3.87–5.11)
RDW: 12.9 % (ref 11.5–15.5)
WBC: 4.9 K/uL (ref 4.0–10.5)
nRBC: 0 % (ref 0.0–0.2)

## 2024-07-10 LAB — VALPROIC ACID LEVEL: Valproic Acid Lvl: 52 ug/mL (ref 50–100)

## 2024-07-10 MED ORDER — SERTRALINE HCL 25 MG PO TABS: 25.0000 mg | ORAL_TABLET | Freq: Every day | ORAL | 0 refills | Status: DC

## 2024-07-11 ENCOUNTER — Ambulatory Visit: Admitting: Internal Medicine

## 2024-07-11 ENCOUNTER — Ambulatory Visit: Payer: Self-pay | Admitting: Psychiatry

## 2024-07-11 ENCOUNTER — Telehealth: Payer: Self-pay | Admitting: *Deleted

## 2024-07-11 ENCOUNTER — Other Ambulatory Visit: Payer: Self-pay | Admitting: *Deleted

## 2024-07-11 ENCOUNTER — Other Ambulatory Visit: Payer: Self-pay

## 2024-07-11 VITALS — BP 113/70 | HR 67 | Temp 98.6°F | Ht 64.0 in | Wt 154.4 lb

## 2024-07-11 DIAGNOSIS — R110 Nausea: Secondary | ICD-10-CM

## 2024-07-11 DIAGNOSIS — K219 Gastro-esophageal reflux disease without esophagitis: Secondary | ICD-10-CM | POA: Diagnosis not present

## 2024-07-11 DIAGNOSIS — R198 Other specified symptoms and signs involving the digestive system and abdomen: Secondary | ICD-10-CM

## 2024-07-11 DIAGNOSIS — R6881 Early satiety: Secondary | ICD-10-CM | POA: Diagnosis not present

## 2024-07-11 DIAGNOSIS — R634 Abnormal weight loss: Secondary | ICD-10-CM

## 2024-07-11 DIAGNOSIS — K5900 Constipation, unspecified: Secondary | ICD-10-CM

## 2024-07-11 DIAGNOSIS — Z860101 Personal history of adenomatous and serrated colon polyps: Secondary | ICD-10-CM

## 2024-07-11 MED ORDER — LINACLOTIDE 145 MCG PO CAPS: 145.0000 ug | ORAL_CAPSULE | Freq: Every day | ORAL | Status: DC

## 2024-07-11 NOTE — Progress Notes (Signed)
 Medication Samples have been provided to the patient.  Drug name: LINZESS        Strength: 145        Qty: 3  LOT: [id number]  Exp.Date: 2026/03  Dosing instructions: TAKE 1 CAP DAILY  The patient has been instructed regarding the correct time, dose, and frequency of taking this medication, including desired effects and most common side effects.   [name] [name] 4:40 PM 07/11/2024

## 2024-07-11 NOTE — Progress Notes (Signed)
 Referring Provider: Shona Norleen PEDLAR, MD Primary Care Physician:  Shona Norleen PEDLAR, MD Primary GI:  Dr. Cindie  Chief Complaint  Patient presents with   Follow-up    Issues with abdomen, follow up from [facility]. (Follow up on alpha -gal)    HPI:   Laura [name] is a 63 y.o. female who presents to the clinic for follow up visit. Multiple GI complaints for me today.   She complains of weight loss. Endorses poor appetitive. Weight is 154 lbs today. This has steadily declined from 218 lbs (January 2024). Notes chronic diarrhea. 1-2 BMs a day. Mostly loose. She has a history of Alpha gal. She has avoiding red meat, pork, lamb as well as dairy. Occasional nausea. States she gets full fast.   Previous workup:  Labs from April 05, 2024: White blood cell count 4.2, hemoglobin 14.9, MCV 106, platelets 156,000, glucose 171, creatinine 0.71, albumin 4.4, total bilirubin 0.2, alk phos 70, AST 15, ALT 15, B12 926, A1c 6   Labs from May 2025: IgE 8 (within normal limits), 0215-IgE alpha gal 0.11A (equivocal/low) IgE pork <0.1, IgE beef <0.1, IgE lamb <0.1  CTA abd/pelvis 12/2022: IMPRESSION: 1. No evidence for aortic dissection or aneurysm. 2. No evidence for active gastrointestinal bleeding. Aortic Atherosclerosis (ICD10-I70.0). NON-VASCULAR 1. No acute localizing process in the abdomen or pelvis. 2. Sigmoid colon diverticulosis.   BPE 04/2021: No definite mass or stricture in esophagus. Small sliding type hiatal hernia. 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    EGD 11/2023: -zline regular -gastritis s/p bx. No h.pylori. -normal duodenal bulb, first portion of duodenum and second portion of duodenum -esophageal dilation   Colonoscopy 02/2024: -nonbleeding internal hemorrhoid -diverticulosis -two 4-33mm polyps in descending colon, tubular adenomas -random colon biopsies, neg -next colonoscopy in 5 years    Colonoscopy 01/2021: Non-bleeding  internal hemorrhoids. Diverticulosis Two 5-8 mm ascending colon polyps -path tubular adenomas Next colonoscopy in five years      Past Medical History:  Diagnosis Date   Anemia    Aortic atherosclerosis    Asthma    Atrial fibrillation and flutter (HCC)    a.) CHA2DS2VASc = 6 (sex, HTN, TIA x2, vascular disease history, T2DM);  b.) DCCV ~ 2009; c.) DCCV 08/29/2018 (200J x1 --> SR with 1st degree AVB); d.) rate/rhythm maintained on oral diltiazem  + bisoprolol ; chronically anticoagulated with apixaban    Bipolar disorder (HCC)    Carpal tunnel syndrome of right wrist    CHB (complete heart block) (HCC)    a.) s/p St. Jude dual chamber PPM placement   CHF (congestive heart failure) (HCC)    Complication of anesthesia    a.) allergic reaction to perioperative analgesics + anesthetic medications in [place] ; unsure of combination; not able to recount reactions/events --> states I ended up in the ICU for 5 days   Constipation    a.) on linaclotide    COPD (chronic obstructive pulmonary disease) (HCC)    DDD (degenerative disc disease), thoracic    GERD (gastroesophageal reflux disease)    H/O congenital atrial septal defect (ASD) repair    Hallux valgus of right foot    Hammertoe of second toe of right foot    History of 2019 novel coronavirus disease (COVID-19)    a.) 10/2020; b.) 05/2021   HLD (hyperlipidemia)    HTN (hypertension)    Hypogammaglobulinemia    a.) on Curitru   Hypothyroid    IgA deficiency (HCC)    IgE deficiency  Long term current use of anticoagulant    a.) apixaban    Lumbar stenosis    Migraines    Mitral stenosis 02/08/2022   a.) TTE 02/08/2022: EF 60-65%, mild MAC, mild MV stenosis (peak grad 14.6 / mean grad 4.0)   Neuropathy    NSVT (nonsustained ventricular tachycardia) (HCC) 10/31/2018   a.) Holter 10/31/2018: 6 beat run   Obesity    OSA on CPAP    Osteoarthritis    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 Jude dual  chamber device; placed for symptomatic bradycardia secondary to intermittent CHB   PTSD (post-traumatic stress disorder)    Pulmonary nodules    Recurrent sinusitis    Short-term memory loss    Sleep difficulties    a.) takes melatonin   Symptomatic bradycardia    a.) s/p St. Jude dual chamber PPM placement   Syncope    TIA (transient ischemic attack)    x3-last one in 2015   Tremors of nervous system    Type 2 diabetes mellitus (HCC)     Past Surgical History:  Procedure Laterality Date   ASD REPAIR  1968   BALLOON DILATION N/A 11/27/2023   Procedure: BALLOON DILATION;  Surgeon: Cindie Carlin POUR, DO;  Location: [facility];  Service: Endoscopy;  Laterality: N/A;  9:00 am, asa 3   BIOPSY  01/26/2021   Procedure: BIOPSY;  Surgeon: Cindie Carlin POUR, DO;  Location: [facility];  Service: Endoscopy;;  gastric   BIOPSY  11/27/2023   Procedure: BIOPSY;  Surgeon: Cindie Carlin POUR, DO;  [facility]: [facility];  Service: Endoscopy;;   CARDIOVERSION  2009   CARDIOVERSION N/A 08/29/2018   Procedure: CARDIOVERSION;  Surgeon: Alvan Dorn FALCON, MD;  [facility]: [facility];  Service: Endoscopy;  Laterality: N/A;   CARPAL TUNNEL RELEASE Bilateral    CESAREAN SECTION  1986, 1988, 1994   COLONOSCOPY N/A 02/15/2024   Procedure: COLONOSCOPY;  Surgeon: Cindie Carlin POUR, DO;  [facility];  Service: Endoscopy;  Laterality: N/A;  8:30 am, asa 3   COLONOSCOPY WITH PROPOFOL  N/A 01/26/2021   Procedure: COLONOSCOPY WITH PROPOFOL ;  Surgeon: Cindie Carlin POUR, DO;  [facility]: [facility];  Service: Endoscopy;  Laterality: N/A;  am appt, diabetic   ESOPHAGOGASTRODUODENOSCOPY (EGD) WITH PROPOFOL  N/A 01/26/2021   Procedure: ESOPHAGOGASTRODUODENOSCOPY (EGD) WITH PROPOFOL ;  Surgeon: Cindie Carlin POUR, DO;  [facility];  Service: Endoscopy;  Laterality: N/A;   ESOPHAGOGASTRODUODENOSCOPY (EGD) WITH PROPOFOL  N/A 11/27/2023   Procedure: ESOPHAGOGASTRODUODENOSCOPY (EGD) WITH PROPOFOL ;   Surgeon: Cindie Carlin POUR, DO;  [facility];  Service: Endoscopy;  Laterality: N/A;  9:00 am, asa 3   HALLUX VALGUS [place] Right 07/29/2022   Procedure: HALLUX VALGUS;  Surgeon: Silva Juliene SAUNDERS, DPM;  Location: [facility];  Service: Podiatry;  Laterality: Right;   HAMMER TOE SURGERY Right 07/29/2022   Procedure: HAMMER TOE CORRECTION 2ND;  Surgeon: Silva Juliene SAUNDERS, DPM;  Location: [facility];  Service: Podiatry;  Laterality: Right;   LEFT HEART CATH AND CORONARY ANGIOGRAPHY N/A 08/30/2019   Procedure: LEFT HEART CATH AND CORONARY ANGIOGRAPHY;  Surgeon: Dann Candyce RAMAN, MD;  Location: [facility] INVAS[facility] CV LAB;  Service: Cardiovascular;  Laterality: N/A;   NASAL SINUS SURGERY  2017   PACEMAKER IMPLANT N/A 03/03/2022   Procedure: PACEMAKER IMPLANT;  Surgeon: Waddell Danelle ORN, MD;  Location: [facility] CV [facility];  Service: Cardiovascular;  Laterality: N/A;   POLYPECTOMY  01/26/2021   Procedure:  POLYPECTOMY;  Surgeon: Cindie Carlin POUR, DO;  [facility];  Service: Endoscopy;;  colon   TOOTH EXTRACTION N/A 07/04/2023   Procedure: DENTAL RESTORATION/EXTRACTIONS;  Surgeon: Sheryle Hamilton, DMD;  Location: [facility] OR;  Service: Oral Surgery;  Laterality: N/A;    Current Outpatient Medications  Medication Sig Dispense Refill   atorvastatin  (LIPITOR) 20 MG tablet Take 20 mg by mouth at bedtime.     Azelastine  HCl 137 MCG/SPRAY SOLN Place 1 spray into both nostrils daily. 90 mL 1   BD PEN NEEDLE NANO 2ND GEN 32G X 4 MM MISC 1 each by Other route in the morning, at noon, in the evening, and at bedtime. 90 each 0   bisoprolol  (ZEBETA ) 10 MG tablet Take 1 tablet (10 mg total) by mouth daily. 90 tablet 3   blood glucose meter kit and supplies 1 each by Other route 4 (four) times daily. Dispense based on patient and insurance preference. Use up to four times daily as directed. (FOR ICD-10 E10.9, E11.9). 1 each 0   budesonide -formoterol  (SYMBICORT ) 160-4.5 MCG/ACT inhaler Take 2 puffs first thing in  am and then another 2 puffs about 12 hours later. 1 each 12   chlorhexidine  (HIBICLENS ) 4 % external liquid Apply topically daily as needed. 236 mL 0   Cholecalciferol (VITAMIN D3) 125 MCG (5000 UT) CAPS Take 1 capsule (5,000 Units total) by mouth daily. 90 capsule 0   Continuous Glucose Sensor (FREESTYLE LIBRE 3 SENSOR) MISC 1 Piece by Does not apply route every 14 (fourteen) days. Place 1 sensor on the skin every 14 days. Use to check glucose continuously 6 each 1   diltiazem  (CARTIA  XT) 300 MG 24 hr capsule Take 1 capsule (300 mg total) by mouth daily. 100 capsule 2   divalproex  (DEPAKOTE  ER) 500 MG 24 hr tablet Take 1 tablet (500 mg total) by mouth daily AND 2 tablets (1,000 mg total) at bedtime. 270 tablet 0   ELDERBERRY PO Take 4 g by mouth daily. Gummies 2g each     ELIQUIS  5 MG TABS tablet TAKE 1 TABLET BY MOUTH TWICE  DAILY 200 tablet 2   EPINEPHrine  0.3 mg/0.3 mL IJ SOAJ injection Inject 0.3 mg into the muscle once as needed for anaphylaxis.     FERROUS SULFATE  PO Take 65 mg by mouth every evening.     gabapentin  (NEURONTIN ) 600 MG tablet Take 600 mg by mouth 3 (three) times daily.     HYDROcodone -acetaminophen  (NORCO/VICODIN) 5-325 MG tablet Take 1 tablet by mouth every 6 (six) hours as needed. 20 tablet 0   hydrOXYzine  (ATARAX ) 25 MG tablet TAKE ONE TABLET BY MOUTH THREE TIMES DAILY AS NEEDED FOR ANXIETY OR FOR ITCHING 30 tablet 1   Immune Globulin , Human, (CUVITRU  [place]) Inject 27 mg into the skin every 14 (fourteen) days.     insulin  glargine (LANTUS  SOLOSTAR) 100 UNIT/ML Solostar Pen Inject 20 Units into the skin at bedtime. 30 mL 0   Insulin  Pen Needle (PEN NEEDLES 3/16) 31G X 5 MM MISC Use as directed with insulin  pen 100 each 0   JARDIANCE  10 MG TABS tablet TAKE 1 TABLET BY MOUTH DAILY  BEFORE BREAKFAST 100 tablet 2   lansoprazole  (PREVACID ) 30 MG capsule Take 1 capsule (30 mg total) by mouth 2 (two) times daily before a meal. 180 capsule 3   levalbuterol  (XOPENEX  HFA) 45 MCG/ACT  inhaler USE 2 INHALATIONS BY MOUTH EVERY 6 HOURS AS NEEDED FOR WHEEZING 30 g 6   levalbuterol  (XOPENEX )  1.25 MG/3ML nebulizer solution Use 1 vial in the nebulizer every 6 hours as needed for cough, wheeze, shortness of breath or chest tightness. 225 mL 1   levothyroxine  (SYNTHROID , LEVOTHROID) 112 MCG tablet Take 112 mcg by mouth daily before breakfast.     linaclotide  (LINZESS ) 290 MCG CAPS capsule Take 1 capsule (290 mcg total) by mouth daily before breakfast. 90 capsule 3   losartan  (COZAAR ) 50 MG tablet Take 50 mg by mouth every morning.     MELATONIN GUMMIES PO Take 10 mg by mouth at bedtime.     metFORMIN  (GLUCOPHAGE ) 500 MG tablet Take 500 mg by mouth 2 (two) times daily.     montelukast  (SINGULAIR ) 10 MG tablet TAKE 1 TABLET BY MOUTH AT  BEDTIME 100 tablet 2   Multiple Vitamins-Minerals (HAIR SKIN & NAILS PO) Take 1 tablet by mouth daily.     Multiple Vitamins-Minerals (MULTIVITAMIN WITH MINERALS) tablet Take 1 tablet by mouth daily. Woman     mupirocin  ointment (BACTROBAN ) 2 % Apply 1 Application topically 2 (two) times daily. 60 g 0   Nebulizer MISC 1 Device by Other route as directed. Nebulizer tubing kit 1 each 1   ondansetron  (ZOFRAN -ODT) 8 MG disintegrating tablet Take 8 mg by mouth daily as needed for vomiting or nausea.     ONETOUCH ULTRA test strip 1 each by Other route in the morning, at noon, in the evening, and at bedtime.     polyethylene glycol (MIRALAX  / GLYCOLAX ) 17 g packet Take 17 g by mouth daily as needed. 14 each 0   Potassium Chloride  ER 20 MEQ TBCR Take 20 mEq by mouth daily.     sertraline  (ZOLOFT ) 25 MG tablet Take 1 tablet (25 mg total) by mouth at bedtime. 90 tablet 0   Spacer/Aero-Holding Chambers DEVI 1 Device by Does not apply route as directed. 1 each 1   Tezepelumab -ekko (TEZSPIRE ) 210 MG/1. SOAJ Inject 210 mg into the skin every 28 (twenty-eight) days. 1.91 mL 11   topiramate  (TOPAMAX ) 100 MG tablet TAKE 2  TABLETS BY MOUTH  IN THE MORNING AND 1 TABLET  IN  THE EVENING 450 tablet 3   TRULICITY  4.5 MG/0.5ML SOAJ INJECT THE CONTENTS OF ONE PEN  SUBCUTANEOUSLY WEEKLY AS  DIRECTED 6 mL 3   amoxicillin  (AMOXIL ) 500 MG tablet Take 1 tablet (500 mg total) by mouth 3 (three) times daily. (Patient not taking: Reported on 07/11/2024) 15 tablet 0   Current Facility-Administered Medications  Medication Dose Route Frequency Provider Last Rate Last Admin   tezepelumab -ekko (TEZSPIRE ) 210 MG/1. syringe 210 mg  210 mg Subcutaneous Q28 days Iva Marty Saltness, MD   210 mg at 04/01/24 9060    Allergies as of 07/11/2024 - Review Complete 07/11/2024  Allergen Reaction Noted   Alpha-gal  03/18/2024   Ativan [lorazepam] Hives 02/06/2018   Phenergan [promethazine hcl] Hives 02/06/2018    Family History  Problem Relation Age of Onset   Schizophrenia Mother    Hypertension Mother    Stroke Mother    Kidney disease Mother    Heart attack Mother    Alcohol abuse Mother    Colon cancer Father        colon cancer   Kidney disease Sister    Hypertension Sister    Bipolar disorder Sister    Atrial fibrillation Sister    Schizophrenia Sister    Heart disease Brother    Prostate cancer Brother    Stroke Maternal Aunt    Heart  disease Maternal Aunt    Thyroid  disease Daughter    Hashimoto's thyroiditis Daughter    Celiac disease Daughter    Allergic rhinitis Daughter    Asthma Neg Hx     Social History   Socioeconomic History   Marital status: Widowed    Spouse name: Not on file   Number of children: 3   Years of education: 10   Highest education level: Not on file  Occupational History   Not on file  Tobacco Use   Smoking status: Former    Current packs/day: 0.00    Average packs/day: 1 pack/day for 28.0 years (28.0 ttl pk-yrs)    Types: Cigarettes    Start date: 83    Quit date: 2008    Years since quitting: 17.7   Smokeless tobacco: Never  Vaping Use   Vaping status: Never Used  Substance and Sexual Activity   Alcohol use:  Not Currently   Drug use: Not Currently   Sexual activity: Not Currently    Birth control/protection: Post-menopausal  Other Topics Concern   Not on file  Social History Narrative   Right handed   Drinks caffeine   One story home   Social Drivers of Health   Financial Resource Strain: High Risk (01/06/2022)   Overall Financial Resource Strain (CARDIA)    Difficulty of Paying Living Expenses: Hard  Food Insecurity: No Food Insecurity (01/18/2024)   Hunger Vital Sign    Worried About Running Out of Food in the Last Year: Never true    Ran Out of Food in the Last Year: Never true  Transportation Needs: No Transportation Needs (01/18/2024)   PRAPARE - Administrator, Civil Service (Medical): No    Lack of Transportation (Non-Medical): No  Physical Activity: Insufficiently Active (01/06/2022)   Exercise Vital Sign    Days of Exercise per Week: 4 days    Minutes of Exercise per Session: 30 min  Stress: No Stress Concern Present (01/06/2022)   [employer] of Occupational Health - Occupational Stress Questionnaire    Feeling of Stress : Only a little  Social Connections: Moderately Integrated (01/06/2022)   Social Connection and Isolation Panel    Frequency of Communication with Friends and Family: More than three times a week    Frequency of Social Gatherings with Friends and Family: More than three times a week    Attends Religious Services: More than 4 times per year    Active Member of [employer] or Organizations: Yes    Attends Banker Meetings: More than 4 times per year    Marital Status: Widowed    Subjective: Review of Systems  Constitutional:  Negative for chills and fever.  HENT:  Negative for congestion and hearing loss.   Eyes:  Negative for blurred vision and double vision.  Respiratory:  Negative for cough and shortness of breath.   Cardiovascular:  Negative for chest pain and palpitations.  Gastrointestinal:  Positive for abdominal pain,  constipation, diarrhea, heartburn and nausea. Negative for blood in stool, melena and vomiting.  Genitourinary:  Negative for dysuria and urgency.  Musculoskeletal:  Negative for joint pain and myalgias.  Skin:  Negative for itching and rash.  Neurological:  Negative for dizziness and headaches.  Psychiatric/Behavioral:  Negative for depression. The patient is not nervous/anxious.      Objective: BP 113/70   Pulse 67   Temp 98.6 F (37 C)   Ht 5' 4 (1.626 m)   Wt 154  lb 6.4 oz (70 kg)   BMI 26.50 kg/m  Physical Exam Constitutional:      Appearance: Normal appearance.  HENT:     Head: Normocephalic and atraumatic.  Eyes:     Extraocular Movements: Extraocular movements intact.     Conjunctiva/sclera: Conjunctivae normal.  Cardiovascular:     Rate and Rhythm: Normal rate and regular rhythm.  Pulmonary:     Effort: Pulmonary effort is normal.     Breath sounds: Normal breath sounds.  Abdominal:     General: Bowel sounds are normal.     Palpations: Abdomen is soft.  Musculoskeletal:        General: No swelling. Normal range of motion.     Cervical back: Normal range of motion and neck supple.  Skin:    General: Skin is warm and dry.     Coloration: Skin is not jaundiced.  Neurological:     General: No focal deficit present.     Mental Status: She is alert and oriented to person, place, and time.  Psychiatric:        Mood and Affect: Mood normal.        Behavior: Behavior normal.      Assessment/Plan:  1.  Chronic nausea, weight loss, abdominal pain-will order gastric imaging study to evaluate for gastroparesis.  Call with results.  2.  Chronic GERD-well-controlled on lansoprazole .  Will continue.  3.  Diarrhea and constipation-historically constipated, currently on Linzess  290 mcg daily.  Notes having frequent loose stools.  Will give samples to 145 mcg dosing and see if she tolerates this better.  Call with update.  4.  Alpha gal syndrome-patient with mildly  elevated levels to alpha gal.  She is due for recheck next month.  Follow-up in 6 to 8 weeks. 07/11/2024 3:59 PM

## 2024-07-11 NOTE — Patient Instructions (Signed)
 I am going to order a gastric emptying study to evaluate for possible gastroparesis.  Will call with results.  Continue current medications.  I am going to give you samples of Linzess  145 mcg daily to trial for a week.  Let me know if this dosage works better and I can change your prescription.  Follow-up in 6 to 8 weeks.  It was very nice seeing you today.  Dr. Cindie

## 2024-07-11 NOTE — Telephone Encounter (Signed)
 [employer] PA for GES: Notification or Prior Authorization is not required for the requested services You are not required to submit a notification/prior authorization based on the information provided. If you have general questions about the prior authorization requirements, visit [email or web address] > Clinician Resources > Advance and Admission Notification Requirements. The number above acknowledges your notification. Please write this reference number down for future reference. If you would like to request an organization determination, please call us  at [phone]. Decision ID #: [id number]

## 2024-07-11 NOTE — Addendum Note (Signed)
 Addended by: Garnett Rekowski I on: 07/11/2024 04:42 PM   Modules accepted: Orders

## 2024-07-11 NOTE — Progress Notes (Signed)
 Please discuss with the patient- Valproic acid  level is slightly below the therapeutic range. I recommend increasing the dose by 250 mg per day. Other lab results are within normal limits, except for a slightly elevated hemoglobin level. I suggest the patient follow up with their primary care provider for further evaluation.

## 2024-07-12 ENCOUNTER — Encounter: Payer: Self-pay | Admitting: *Deleted

## 2024-07-12 NOTE — Telephone Encounter (Signed)
 Pt informed of GES scheduled for Monday 07/15/24, arrive at 8:45 am. Pt states she may need to reschedule. Number given to pt to call and reschedule. Appt for GES was sent to pt via mychart.

## 2024-07-12 NOTE — Progress Notes (Signed)
 Called patient to discuss lab results no answer left voicemail for patient to return call to office

## 2024-07-12 NOTE — Telephone Encounter (Signed)
 Patient returned call informed of the lab results and advised patient to increase by 250 mg she voiced understanding

## 2024-07-15 ENCOUNTER — Ambulatory Visit (HOSPITAL_COMMUNITY): Admission: RE | Admit: 2024-07-15 | Source: Ambulatory Visit

## 2024-07-17 ENCOUNTER — Ambulatory Visit (HOSPITAL_COMMUNITY)
Admission: RE | Admit: 2024-07-17 | Discharge: 2024-07-17 | Disposition: A | Discharge status: Home / Self Care | Source: Ambulatory Visit | Attending: Internal Medicine | Admitting: Internal Medicine

## 2024-07-17 ENCOUNTER — Encounter (HOSPITAL_COMMUNITY): Payer: Self-pay

## 2024-07-17 DIAGNOSIS — R6881 Early satiety: Secondary | ICD-10-CM | POA: Insufficient documentation

## 2024-07-17 DIAGNOSIS — R110 Nausea: Secondary | ICD-10-CM | POA: Insufficient documentation

## 2024-07-17 DIAGNOSIS — R634 Abnormal weight loss: Secondary | ICD-10-CM | POA: Diagnosis not present

## 2024-07-17 DIAGNOSIS — R112 Nausea with vomiting, unspecified: Secondary | ICD-10-CM | POA: Diagnosis not present

## 2024-07-17 DIAGNOSIS — R1030 Lower abdominal pain, unspecified: Secondary | ICD-10-CM | POA: Diagnosis not present

## 2024-07-17 MED ORDER — TECHNETIUM TC 99M SULFUR COLLOID
2.0000 | Freq: Once | INTRAVENOUS | Status: AC | PRN
  Administered 2024-07-17: 2.2 via INTRAVENOUS

## 2024-07-18 ENCOUNTER — Ambulatory Visit (HOSPITAL_COMMUNITY): Admitting: Clinical

## 2024-07-18 DIAGNOSIS — F4310 Post-traumatic stress disorder, unspecified: Secondary | ICD-10-CM | POA: Diagnosis not present

## 2024-07-18 DIAGNOSIS — D839 Common variable immunodeficiency, unspecified: Secondary | ICD-10-CM | POA: Diagnosis not present

## 2024-07-18 NOTE — Progress Notes (Signed)
 IN PERSON   I connected with Laura [name] on 07/18/24 at  9:00 AM EDT in person and verified that I am speaking with the correct person using two identifiers.  Location: Patient: office Provider: office   I discussed the limitations of evaluation and management by telemedicine and the availability of in person appointments. The patient expressed understanding and agreed to proceed. (IN PERSON)   THERAPIST PROGRESS NOTE   Session Time: 9:00 AM-:9:55 AM   Participation Level: Active   Behavioral Response: CasualAlertDepressed   Type of Therapy: Individual Therapy   Treatment Goals addressed: Coping/ non-medication treatment therapy for PTSD.   Interventions: CBT, DBT, Solution Focused, Strength-based and Supportive   Summary: Laura [name] is a 63 y.o. female who presents with PTSD. The OPT therapist worked with the patient for her ongoing OPT treatment session. The OPT therapist utilized Motivational Interviewing to assist in creating therapeutic repore.The patient in the session was engaged and work in collaboration giving feedback about her triggers and symptoms over the past few weeks.  The patient spoke about her recent trip to [place]  visiting family.The OPT therapist utilized Cognitive Behavioral Therapy through cognitive restructuring as well as worked with the patient on coping strategies to assist in management of mental health symptoms. The OPT therapist worked with the patient looking at interactions with family during and post her recent trip to [place] .The patient has been utilizing her coping skills for Fall. The OPT therapist examined with the patient patterns of interaction that have been historically triggering for the patient and worked with the patient on management of her interaction and the impact of interactions with key individuals in her family. The OPT therapist placed emphasis on the patient keeping all upcoming health appointments and adhering to ongoing  health treatment recommendations. The OPT therapist reviewed all appointments listed coming up in the patients MyChart    Suicidal/Homicidal: Nowithout intent/plan   Therapist Response:The OPT therapist worked with the patient for the patients scheduled session. The patient was engaged in her session and gave feedback in relation to triggers, symptoms, and behavior responses over the past few weeks. The patient spoke about her trip recently to  [place] to visit with family over the past several weeks. The OPT therapist worked with the patient utilizing an in session Cognitive Behavioral Therapy exercise. The OPT therapist worked with the patient allowing a safe place to vent and talk about current stressors. The patient has continued to work with her physical health providers to manage physical health conditions. The patient spoke about her mood and interactions with family recently The patient spoke about looking forward to  upcoming change of environment in going to [employer] in [place] in the upcoming weekend.The OPT therapist will continue treatment work with the patient in her next scheduled session.   Plan: Return again in 2/3 weeks.   Diagnosis:      Axis I: Post Traumatic Stress Disorder                            Axis II: No diagnosis     Collaboration of Care: Overview with the patient of involvement in the Med Management program with Dr. Vickey.   Patient/Guardian was advised Release of Information must be obtained prior to any record release in order to collaborate their care with an outside provider. Patient/Guardian was advised if they have not already done so to contact the registration department to sign all necessary forms in  order for us  to release information regarding their care.    Consent: Patient/Guardian gives verbal consent for treatment and assignment of benefits for services provided during this visit. Patient/Guardian expressed understanding and agreed to  proceed.      I discussed the assessment and treatment plan with the patient. The patient was provided an opportunity to ask questions and all were answered. The patient agreed with the plan and demonstrated an understanding of the instructions.   The patient was advised to call back or seek an in-person evaluation if the symptoms worsen or if the condition fails to improve as anticipated.   I provided 55 minutes of face-to-face time during this encounter.   Jerel Pepper, LCSW    07/18/2024

## 2024-07-23 ENCOUNTER — Ambulatory Visit (INDEPENDENT_AMBULATORY_CARE_PROVIDER_SITE_OTHER): Admitting: "Endocrinology

## 2024-07-23 ENCOUNTER — Encounter: Attending: "Endocrinology | Admitting: Nutrition

## 2024-07-23 ENCOUNTER — Encounter: Payer: Self-pay | Admitting: "Endocrinology

## 2024-07-23 VITALS — Ht 64.0 in | Wt 152.0 lb

## 2024-07-23 VITALS — BP 146/80 | HR 76 | Ht 64.0 in | Wt 152.4 lb

## 2024-07-23 DIAGNOSIS — Z794 Long term (current) use of insulin: Secondary | ICD-10-CM

## 2024-07-23 DIAGNOSIS — R634 Abnormal weight loss: Secondary | ICD-10-CM | POA: Insufficient documentation

## 2024-07-23 DIAGNOSIS — E782 Mixed hyperlipidemia: Secondary | ICD-10-CM

## 2024-07-23 DIAGNOSIS — E119 Type 2 diabetes mellitus without complications: Secondary | ICD-10-CM | POA: Diagnosis present

## 2024-07-23 DIAGNOSIS — E1165 Type 2 diabetes mellitus with hyperglycemia: Secondary | ICD-10-CM

## 2024-07-23 DIAGNOSIS — F5002 Anorexia nervosa, binge eating/purging type, mild: Secondary | ICD-10-CM | POA: Diagnosis present

## 2024-07-23 DIAGNOSIS — I10 Essential (primary) hypertension: Secondary | ICD-10-CM

## 2024-07-23 DIAGNOSIS — E039 Hypothyroidism, unspecified: Secondary | ICD-10-CM

## 2024-07-23 LAB — POCT GLYCOSYLATED HEMOGLOBIN (HGB A1C): HbA1c, POC (controlled diabetic range): 6.3 % (ref 0.0–7.0)

## 2024-07-23 MED ORDER — TRULICITY 3 MG/0.5ML ~~LOC~~ SOAJ: 3.0000 mg | SUBCUTANEOUS | 1 refills | Status: DC

## 2024-07-23 NOTE — Patient Instructions (Signed)

## 2024-07-23 NOTE — Progress Notes (Signed)
 07/23/2024, 9:29 AM   Endocrinology follow-up note   Subjective:    Patient ID: Laura [name], female    DOB: 1961/07/10.  Laura [name] was seen since 2019 for management of diabetes, also seen during her last visit in relation to her steroid exposure to rule out adrenal insufficiency.  PMD: Shona Norleen PEDLAR, MD.   Past Medical History:  Diagnosis Date   Anemia    Aortic atherosclerosis    Asthma    Atrial fibrillation and flutter (HCC)    a.) CHA2DS2VASc = 6 (sex, HTN, TIA x2, vascular disease history, T2DM);  b.) DCCV ~ 2009; c.) DCCV 08/29/2018 (200J x1 --> SR with 1st degree AVB); d.) rate/rhythm maintained on oral diltiazem  + bisoprolol ; chronically anticoagulated with apixaban    Bipolar disorder (HCC)    Carpal tunnel syndrome of right wrist    CHB (complete heart block) (HCC)    a.) s/p St. Jude dual chamber PPM placement   CHF (congestive heart failure) (HCC)    Complication of anesthesia    a.) allergic reaction to perioperative analgesics + anesthetic medications in [place] ; unsure of combination; not able to recount reactions/events --> states I ended up in the ICU for 5 days   Constipation    a.) on linaclotide    COPD (chronic obstructive pulmonary disease) (HCC)    DDD (degenerative disc disease), thoracic    GERD (gastroesophageal reflux disease)    H/O congenital atrial septal defect (ASD) repair    Hallux valgus of right foot    Hammertoe of second toe of right foot    History of 2019 novel coronavirus disease (COVID-19)    a.) 10/2020; b.) 05/2021   HLD (hyperlipidemia)    HTN (hypertension)    Hypogammaglobulinemia    a.) on Curitru   Hypothyroid    IgA deficiency (HCC)    IgE deficiency    Long term current use of anticoagulant    a.) apixaban    Lumbar stenosis    Migraines    Mitral stenosis 02/08/2022   a.) TTE 02/08/2022: EF 60-65%, mild MAC, mild MV stenosis  (peak grad 14.6 / mean grad 4.0)   Neuropathy    NSVT (nonsustained ventricular tachycardia) (HCC) 10/31/2018   a.) Holter 10/31/2018: 6 beat run   Obesity    OSA on CPAP    Osteoarthritis    Presence of permanent cardiac 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 Jude dual chamber device; placed for symptomatic bradycardia secondary to intermittent CHB   PTSD (post-traumatic stress disorder)    Pulmonary nodules    Recurrent sinusitis    Short-term memory loss    Sleep difficulties    a.) takes melatonin   Symptomatic bradycardia    a.) s/p St. Jude dual chamber PPM placement   Syncope    TIA (transient ischemic attack)    x3-last one in 2015   Tremors of nervous system    Type 2 diabetes mellitus (HCC)    Past Surgical History:  Procedure Laterality Date   ASD REPAIR  1968   BALLOON DILATION N/A 11/27/2023   Procedure: BALLOON DILATION;  Surgeon: Cindie Carlin POUR, DO;  [facility]: [facility];  Service: Endoscopy;  Laterality: N/A;  9:00 am, asa 3   BIOPSY  01/26/2021   Procedure: BIOPSY;  Surgeon: Cindie Carlin POUR, DO;  Location: [facility];  Service: Endoscopy;;  gastric   BIOPSY  11/27/2023   Procedure: BIOPSY;  Surgeon: Cindie Carlin POUR, DO;  [facility]: [facility];  Service: Endoscopy;;   CARDIOVERSION  2009   CARDIOVERSION N/A 08/29/2018   Procedure: CARDIOVERSION;  Surgeon: Alvan Dorn FALCON, MD;  [facility]: [facility];  Service: Endoscopy;  Laterality: N/A;   CARPAL TUNNEL RELEASE Bilateral    CESAREAN SECTION  1986, 1988, 1994   COLONOSCOPY N/A 02/15/2024   Procedure: COLONOSCOPY;  Surgeon: Cindie Carlin POUR, DO;  [facility];  Service: Endoscopy;  Laterality: N/A;  8:30 am, asa 3   COLONOSCOPY WITH PROPOFOL  N/A 01/26/2021   Procedure: COLONOSCOPY WITH PROPOFOL ;  Surgeon: Cindie Carlin POUR, DO;  [facility];  Service: Endoscopy;  Laterality: N/A;  am appt, diabetic   ESOPHAGOGASTRODUODENOSCOPY (EGD) WITH PROPOFOL  N/A 01/26/2021   Procedure:  ESOPHAGOGASTRODUODENOSCOPY (EGD) WITH PROPOFOL ;  Surgeon: Cindie Carlin POUR, DO;  [facility];  Service: Endoscopy;  Laterality: N/A;   ESOPHAGOGASTRODUODENOSCOPY (EGD) WITH PROPOFOL  N/A 11/27/2023   Procedure: ESOPHAGOGASTRODUODENOSCOPY (EGD) WITH PROPOFOL ;  Surgeon: Cindie Carlin POUR, DO;  [facility];  Service: Endoscopy;  Laterality: N/A;  9:00 am, asa 3   HALLUX VALGUS [place]IN Right 07/29/2022   Procedure: HALLUX VALGUS;  Surgeon: Silva Juliene SAUNDERS, DPM;  Location: [facility];  Service: Podiatry;  Laterality: Right;   HAMMER TOE SURGERY Right 07/29/2022   Procedure: HAMMER TOE CORRECTION 2ND;  Surgeon: Silva Juliene SAUNDERS, DPM;  Location: [facility];  Service: Podiatry;  Laterality: Right;   LEFT HEART CATH AND CORONARY ANGIOGRAPHY N/A 08/30/2019   Procedure: LEFT HEART CATH AND CORONARY ANGIOGRAPHY;  Surgeon: Dann Candyce RAMAN, MD;  Location: [facility];  Service: Cardiovascular;  Laterality: N/A;   NASAL SINUS SURGERY  2017   PACEMAKER IMPLANT N/A 03/03/2022   Procedure: PACEMAKER IMPLANT;  Surgeon: Waddell Danelle ORN, MD;  Location: [facility];  Service: Cardiovascular;  Laterality: N/A;   POLYPECTOMY  01/26/2021   Procedure: POLYPECTOMY;  Surgeon: Cindie Carlin POUR, DO;  [facility]: [facility];  Service: Endoscopy;;  colon   TOOTH EXTRACTION N/A 07/04/2023   Procedure: DENTAL RESTORATION/EXTRACTIONS;  Surgeon: Sheryle Hamilton, DMD;  Location: [facility];  Service: Oral Surgery;  Laterality: N/A;   Social History   Socioeconomic History   Marital status: Widowed    Spouse name: Not on file   Number of children: 3   Years of education: 10   Highest education level: Not on file  Occupational History   Not on file  Tobacco Use   Smoking status: Former    Current packs/day: 0.00    Average packs/day: 1 pack/day for 28.0 years (28.0 ttl pk-yrs)    Types: Cigarettes    Start date: 58    Quit date: 2008    Years since quitting: 17.7   Smokeless tobacco:  Never  Vaping Use   Vaping status: Never Used  Substance and Sexual Activity   Alcohol use: Not Currently   Drug use: Not Currently   Sexual activity: Not Currently    Birth control/protection: Post-menopausal  Other Topics Concern   Not on file  Social History Narrative   Right handed   Drinks caffeine   One story home   Social Drivers of [name]  Financial Resource Strain: High Risk (01/06/2022)   Overall Financial Resource Strain (CARDIA)    Difficulty of Paying Living Expenses: Hard  Food Insecurity: No Food Insecurity (01/18/2024)   Hunger Vital Sign    Worried About Running Out of Food in the Last Year: Never true    Ran Out of Food in the Last Year: Never true  Transportation Needs: No Transportation Needs (01/18/2024)   PRAPARE - Administrator, Civil Service (Medical): No    Lack of Transportation (Non-Medical): No  Physical Activity: Insufficiently Active (01/06/2022)   Exercise Vital Sign    Days of Exercise per Week: 4 days    Minutes of Exercise per Session: 30 min  Stress: No Stress Concern Present (01/06/2022)   Laura [name] - Occupational Stress Questionnaire    Feeling of Stress : Only a little  Social Connections: Moderately Integrated (01/06/2022)   Social Connection and Isolation Panel    Frequency of Communication with Friends and Family: More than three times a week    Frequency of Social Gatherings with Friends and Family: More than three times a week    Attends Religious Services: More than 4 times per year    Active Member of [employer] or Organizations: Yes    Attends Banker Meetings: More than 4 times per year    Marital Status: Widowed   Outpatient Encounter Medications as of 07/23/2024  Medication Sig   divalproex  (DEPAKOTE  ER) 500 MG 24 hr tablet Take 1 tablet (500 mg total) by mouth daily AND 2 tablets (1,000 mg total) at bedtime. (Patient taking differently: Take 1.5 tablets (750 mg total) by mouth  daily AND 2 tablets (1,000 mg total) at bedtime.)   Dulaglutide  (TRULICITY ) 3 MG/0.5ML SOAJ Inject 3 mg as directed once a week.   [DISCONTINUED] insulin  glargine (LANTUS  SOLOSTAR) 100 UNIT/ML Solostar Pen Inject 20 Units into the skin at bedtime. (Patient taking differently: Inject 5 Units into the skin at bedtime.)   atorvastatin  (LIPITOR) 20 MG tablet Take 20 mg by mouth at bedtime.   Azelastine  HCl 137 MCG/SPRAY SOLN Place 1 spray into both nostrils daily.   BD PEN NEEDLE NANO 2ND GEN 32G X 4 MM MISC 1 each by Other route in the morning, at noon, in the evening, and at bedtime.   bisoprolol  (ZEBETA ) 10 MG tablet Take 1 tablet (10 mg total) by mouth daily.   blood glucose meter kit and supplies 1 each by Other route 4 (four) times daily. Dispense based on patient and insurance preference. Use up to four times daily as directed. (FOR ICD-10 E10.9, E11.9).   budesonide -formoterol  (SYMBICORT ) 160-4.5 MCG/ACT inhaler Take 2 puffs first thing in am and then another 2 puffs about 12 hours later.   chlorhexidine  (HIBICLENS ) 4 % external liquid Apply topically daily as needed.   Cholecalciferol (VITAMIN D3) 125 MCG (5000 UT) CAPS Take 1 capsule (5,000 Units total) by mouth daily.   Continuous Glucose Sensor (FREESTYLE LIBRE 3 SENSOR) MISC 1 Piece by Does not apply route every 14 (fourteen) days. Place 1 sensor on the skin every 14 days. Use to check glucose continuously   diltiazem  (CARTIA  XT) 300 MG 24 hr capsule Take 1 capsule (300 mg total) by mouth daily.   ELDERBERRY PO Take 4 g by mouth daily. Gummies 2g each   ELIQUIS  5 MG TABS tablet TAKE 1 TABLET BY MOUTH TWICE  DAILY   EPINEPHrine  0.3 mg/0.3 mL IJ SOAJ injection Inject 0.3 mg into  the muscle once as needed for anaphylaxis.   FERROUS SULFATE  PO Take 65 mg by mouth every evening.   gabapentin  (NEURONTIN ) 600 MG tablet Take 600 mg by mouth 3 (three) times daily.   hydrOXYzine  (ATARAX ) 25 MG tablet TAKE ONE TABLET BY MOUTH THREE TIMES DAILY AS  NEEDED FOR ANXIETY OR FOR ITCHING   Immune Globulin , Human, (CUVITRU  [place]) Inject 27 mg into the skin every 14 (fourteen) days.   Insulin  Pen Needle (PEN NEEDLES 3/16) 31G X 5 MM MISC Use as directed with insulin  pen   JARDIANCE  10 MG TABS tablet TAKE 1 TABLET BY MOUTH DAILY  BEFORE BREAKFAST   lansoprazole  (PREVACID ) 30 MG capsule Take 1 capsule (30 mg total) by mouth 2 (two) times daily before a meal.   levalbuterol  (XOPENEX  HFA) 45 MCG/ACT inhaler USE 2 INHALATIONS BY MOUTH EVERY 6 HOURS AS NEEDED FOR WHEEZING   levalbuterol  (XOPENEX ) 1.25 MG/3ML nebulizer solution Use 1 vial in the nebulizer every 6 hours as needed for cough, wheeze, shortness of breath or chest tightness.   levothyroxine  (SYNTHROID , LEVOTHROID) 112 MCG tablet Take 112 mcg by mouth daily before breakfast.   linaclotide  (LINZESS ) 145 MCG CAPS capsule Take 1 capsule (145 mcg total) by mouth daily before breakfast.   losartan  (COZAAR ) 50 MG tablet Take 50 mg by mouth every morning.   MELATONIN GUMMIES PO Take 10 mg by mouth at bedtime.   metFORMIN  (GLUCOPHAGE ) 500 MG tablet Take 500 mg by mouth 2 (two) times daily.   montelukast  (SINGULAIR ) 10 MG tablet TAKE 1 TABLET BY MOUTH AT  BEDTIME   Multiple Vitamins-Minerals (HAIR SKIN & NAILS PO) Take 1 tablet by mouth daily.   Multiple Vitamins-Minerals (MULTIVITAMIN WITH MINERALS) tablet Take 1 tablet by mouth daily. Woman   mupirocin  ointment (BACTROBAN ) 2 % Apply 1 Application topically 2 (two) times daily.   Nebulizer MISC 1 Device by Other route as directed. Nebulizer tubing kit   ondansetron  (ZOFRAN -ODT) 8 MG disintegrating tablet Take 8 mg by mouth daily as needed for vomiting or nausea.   ONETOUCH ULTRA test strip 1 each by Other route in the morning, at noon, in the evening, and at bedtime.   polyethylene glycol (MIRALAX  / GLYCOLAX ) 17 g packet Take 17 g by mouth daily as needed.   Potassium Chloride  ER 20 MEQ TBCR Take 20 mEq by mouth daily.   sertraline  (ZOLOFT ) 25 MG tablet  Take 1 tablet (25 mg total) by mouth at bedtime.   Spacer/Aero-Holding Chambers DEVI 1 Device by Does not apply route as directed.   Tezepelumab -ekko (TEZSPIRE ) 210 MG/1. SOAJ Inject 210 mg into the skin every 28 (twenty-eight) days.   topiramate  (TOPAMAX ) 100 MG tablet TAKE 2  TABLETS BY MOUTH  IN THE MORNING AND 1 TABLET IN  THE EVENING   [DISCONTINUED] TRULICITY  4.5 MG/0.5ML SOAJ INJECT THE CONTENTS OF ONE PEN  SUBCUTANEOUSLY WEEKLY AS  DIRECTED   Facility-Administered Encounter Medications as of 07/23/2024  Medication   tezepelumab -ekko (TEZSPIRE ) 210 MG/1. syringe 210 mg    ALLERGIES: Allergies  Allergen Reactions   Alpha-Gal    Ativan [Lorazepam] Hives   Phenergan [Promethazine Hcl] Hives    VACCINATION STATUS: Immunization History  Administered Date(s) Administered   Influenza Split 08/24/2018   Influenza Whole 07/15/2019   Influenza,inj,Quad PF,6+ Mos 07/10/2017, 10/27/2021   Moderna Sars-Covid-2 Vaccination 12/31/2019, 01/30/2020   Pneumococcal-Unspecified 10/10/2012    Diabetes She presents for her follow-up diabetic visit. She has type 2 diabetes mellitus. Onset time: She was diagnosed  at approximate age of 31 years, stated by gestational diabetes with all 3 of her pregnancies in her 28s. Her disease course has been improving. There are no hypoglycemic associated symptoms. Pertinent negatives for hypoglycemia include no confusion, headaches, pallor or seizures. Pertinent negatives for diabetes include no blurred vision, no chest pain, no fatigue, no polydipsia, no polyphagia and no polyuria. There are no hypoglycemic complications. Symptoms are improving. Diabetic complications include a CVA and heart disease. (She has history of open heart surgery for ASD in 1965.) Risk factors for coronary artery disease include dyslipidemia, diabetes mellitus, family history, obesity, hypertension, post-menopausal, sedentary lifestyle and tobacco exposure. Current diabetic  treatments: She is currently on Trulicity  4.5 mg subcutaneously weekly, metformin  500 mg p.o. twice daily, Jardiance  10 mg p.o. daily, and Lantus  20 units nightly. Her weight is decreasing steadily. She is following a generally unhealthy diet. When asked about meal planning, she reported none. She has not had a previous visit with a dietitian. She rarely (She has medical problem with bronchiectasis for which she gets treated with steroids on and off.) participates in exercise. Her home blood glucose trend is decreasing steadily. Her breakfast blood glucose range is generally 130-140 mg/dl. Her lunch blood glucose range is generally 130-140 mg/dl. Her dinner blood glucose range is generally 130-140 mg/dl. Her bedtime blood glucose range is generally 130-140 mg/dl. Her overall blood glucose range is 130-140 mg/dl. (Laura [name] presents with her CGM device showing improving glycemic profile.  Her AGP report shows 90% time range, 8% 1 hyperglycemia, 2% level 1 hyperglycemia no hypoglycemia.  Her point-of-care A1c is 6.3% maintaining great control.  Her average blood glucose is 139 mg per DL, with glucose variability of 24%.   ) An ACE inhibitor/angiotensin II receptor blocker is being taken.  Hyperlipidemia This is a chronic problem. The current episode started more than 1 year ago. Exacerbating diseases include diabetes and obesity. Associated symptoms include myalgias. Pertinent negatives include no chest pain or shortness of breath. Current antihyperlipidemic treatment includes statins. Risk factors for coronary artery disease include dyslipidemia, diabetes mellitus, family history, obesity, hypertension, a sedentary lifestyle and post-menopausal.  Hypertension This is a chronic problem. The current episode started more than 1 year ago. The problem is uncontrolled. Pertinent negatives include no blurred vision, chest pain, headaches, palpitations or shortness of breath. Risk factors for coronary artery disease  include dyslipidemia, diabetes mellitus, obesity, sedentary lifestyle and smoking/tobacco exposure. Past treatments include ACE inhibitors. Hypertensive end-organ damage includes CVA.  Other The current episode started more than 1 month ago. The problem occurs constantly. Progression since onset: Patient reports that she did have exposure to steroids related to back pain on 2 separate occasions between November 2000 21 and 2022.  Last exposure was related to her COVID infection.  She reports that she developed fatigueced that she developed fatigue. Associated symptoms include myalgias. Pertinent negatives include no arthralgias, chest pain, chills, coughing, fatigue, fever, headaches, nausea, rash or vomiting. She has tried nothing for the symptoms. The treatment provided no relief.     Objective:    BP (!) 146/80   Pulse 76   Ht 5' 4 (1.626 m)   Wt 152 lb 6.4 oz (69.1 kg)   BMI 26.16 kg/m   Wt Readings from Last 3 Encounters:  07/23/24 152 lb 6.4 oz (69.1 kg)  07/11/24 154 lb 6.4 oz (70 kg)  05/01/24 169 lb (76.7 kg)         Latest Ref Rng & Units 07/10/2024  4:05 PM 02/18/2024    9:08 AM 01/18/2024    9:41 AM  CMP  Glucose 70 - 99 mg/dL  866  841   BUN 8 - 23 mg/dL  19  14   Creatinine 9.55 - 1.00 mg/dL  9.33  9.35   Sodium 864 - 145 mmol/L  137  139   Potassium 3.5 - 5.1 mmol/L  4.3  4.3   Chloride 98 - 111 mmol/L  110  110   CO2 22 - 32 mmol/L  21  19   Calcium  8.9 - 10.3 mg/dL  8.9  9.7   Total Protein 6.5 - 8.1 g/dL 7.7  7.0  7.5   Total Bilirubin 0.0 - 1.2 mg/dL 0.4  0.5  0.4   Alkaline Phos 38 - 126 U/L 60  62  61   AST 15 - 41 U/L 17  16  18    ALT 0 - 44 U/L 13  14  17     Lipid Panel     Component Value Date/Time   CHOL 147 11/15/2023 1040   CHOL 157 03/03/2023 1121   TRIG 147 11/15/2023 1040   HDL 42 11/15/2023 1040   HDL 44 03/03/2023 1121   CHOLHDL 3.5 11/15/2023 1040   VLDL 29 11/15/2023 1040   LDLCALC 76 11/15/2023 1040   LDLCALC 92 03/03/2023 1121    LABVLDL 21 03/03/2023 1121    Latest Reference Range & Units 07/27/22 16:18 12/01/22 00:00 03/03/23 11:21  TSH 0.450 - 4.500 uIU/mL 1.83 1.84 (E) 1.180  T4,Free(Direct) 0.82 - 1.77 ng/dL   8.69  (E): External lab result   Assessment & Plan:  1. DM type 2 causing vascular disease (HCC)  Laura [name] presents with her CGM device showing improving glycemic profile.  Her AGP report shows 90% time range, 8% 1 hyperglycemia, 2% level 1 hyperglycemia no hypoglycemia.  Her point-of-care A1c is 6.3% maintaining great control.  Her average blood glucose is 139 mg per DL, with glucose variability of 24%.  -her diabetes is complicated by CVA, CHF, history of open heart surgery for ASD, obesity/sedentary life, history of chronic smoking with bronchiectasis and Laura [name] remains at a high risk for more acute and chronic complications which include CAD, CVA, CKD, retinopathy, and neuropathy. These are all discussed in detail with the patient.  - I have counseled her on diet management and weight loss, by adopting a carbohydrate restricted/protein rich diet.  - she acknowledges that there is a room for improvement in her food and drink choices. - Suggestion is made for her to avoid simple carbohydrates  from her diet including Cakes, Sweet Desserts, Ice Cream, Soda (diet and regular), Sweet Tea, Candies, Chips, Cookies, Store Bought Juices, Alcohol , Artificial Sweeteners,  Coffee Creamer, and Sugar-free Products, Lemonade. This will help patient to have more stable blood glucose profile and potentially avoid unintended weight gain.  The following Lifestyle Medicine recommendations according to American College of Lifestyle Medicine  [name]) were discussed and and offered to patient and she  agrees to start the journey:  A. Whole Foods, Plant-Based Nutrition comprising of fruits and vegetables, plant-based proteins, whole-grain carbohydrates was discussed in detail with the patient.   A list for source of  those nutrients were also provided to the patient.  Patient will use only water or unsweetened tea for hydration. B.  The need to stay away from risky substances including alcohol, smoking; obtaining 7 to 9 hours of restorative sleep, at least 150 minutes of  moderate intensity exercise weekly, the importance of healthy social connections,  and stress management techniques were discussed. C.  A full color page of  Calorie density of various food groups per pound showing examples of each food groups was provided to the patient.   - she is advised to stick to a routine mealtimes to eat 3 meals  a day and avoid unnecessary snacks ( to snack only to correct hypoglycemia).   - she is following  with Laura [name], RDN, CDE for individualized diabetes education.  - I have approached her with the following individualized plan to manage diabetes and patient agrees:   -She continues to respond to her current regimen.  In light of her significant weight loss, she will be considered for discontinuation of basal insulin  at this time.  She is advised to discontinue Lantus .  Advised to continue to utilize her CGM continuously.    She continues to tolerate Jardiance , advised to continue Jardiance  10 mg p.o. daily at breakfast.    -She would like to stabilize her appetite, discussed and lowered Trulicity  to 3 mg weekly.   - She will continue to benefit from metformin - metformin  500 mg p.o. twice daily.  She is advised to call clinic for blood glucose readings above 200 mg per DL x3, or less than 70 mg per DL.     - Patient specific target  A1c;  LDL, HDL, Triglycerides,  were discussed in detail.  2.  BP/HTN:  -Her blood pressure is  not controlled to target.  She is advised to continue  her current blood pressure medications including losartan  50 mg p.o. daily breakfast, also on diltiazem  300 mg p.o. daily.     3) Lipids/HPL: Her most recent lipid panel showed LDL at 76-controlled.  She is advised to  continue atorvastatin  20 mg p.o. nightly.  Side effects and precautions discussed with her .  4)  Weight/Diet: She presents with significant weight loss, BMI down to 26.16 1 kg/m.  She would like to stabilize her appetite at this point.  I advised her to lower Trulicity  to 3 mg subcutaneously weekly.     She is not a candidate for major weight loss anymore.  The above discussed dietary plan will help achieve weight loss. -She is working with her dietitian-Laura [name] has been a great help to patient.     5 hypothyroidism-long-standing, etiology unclear, she has multiple family members with thyroid  dysfunctions. Her recent thyroid  function tests were consistent with appropriate replacement.  She is advised to continue levothyroxine  112 mcg p.o. daily before breakfast    - We discussed about the correct intake of her thyroid  hormone, on empty stomach at fasting, with water, separated by at least 30 minutes from breakfast and other medications,  and separated by more than 4 hours from calcium , iron, multivitamins, acid reflux medications (PPIs). -Patient is made aware of the fact that thyroid  hormone replacement is needed for life, dose to be adjusted by periodic monitoring of thyroid  function tests.   6) vitamin D  deficiency: - She is currently on  vitamin D  supplements including vitamin D3 5000 units daily.     7) Chronic Care/[name] Maintenance:  -she  is on  Statin medications and  is encouraged to continue to follow up with Ophthalmology, Dentist,  Podiatrist at least yearly or according to recommendations, and advised to  stay away from smoking. I have recommended yearly flu vaccine and pneumonia vaccination at least every 5 years;  and  sleep for  at least 7 hours a day.  - I advised patient to maintain close follow up with Shona Norleen PEDLAR, MD for primary care needs.   I spent  41  minutes in the care of the patient today including review of labs from CMP, Lipids, Thyroid  Function,  Hematology (current and previous including abstractions from other facilities); face-to-face time discussing  her blood glucose readings/logs, discussing hypoglycemia and hyperglycemia episodes and symptoms, medications doses, her options of short and long term treatment based on the latest standards of care / guidelines;  discussion about incorporating lifestyle medicine;  and documenting the encounter. Risk reduction counseling performed per USPSTF guidelines to reduce cardiovascular risk factors.     Please refer to Patient Instructions for Blood Glucose Monitoring and Insulin /Medications Dosing Guide  in media tab for additional information. Please  also refer to  Patient Self Inventory in the Media  tab for reviewed elements of pertinent patient history.  Laura [name] participated in the discussions, expressed understanding, and voiced agreement with the above plans.  All questions were answered to her satisfaction. she is encouraged to contact clinic should she have any questions or concerns prior to her return visit.   Follow up plan: - Return in about 4 months (around 11/23/2024) for F/U with Pre-visit Labs, Meter/CGM/Logs, A1c here.  Ranny Earl, MD [name] [address] [place], [place] [address] Phone: [phone]  Fax: [phone]    07/23/2024, 9:29 AM  This note was partially dictated with voice recognition software. Similar sounding words can be transcribed inadequately or may not  be corrected upon review.

## 2024-07-23 NOTE — Progress Notes (Signed)
 Medical Nutrition Therapy  Appointment Start time:  0930 Appointment End [phone] Nutrition and DM  Follow up She only has appetite in afternoon at 4 pm. Only eating 1 meal per day- usually eats around 4 pm. Admits to still  having disordered eating.  Can't force herself to eat. A1c 6.3% Saw dr. Lenis today.  Trulicity  to 3 mg. Stopping Lantus  now as of today. Had a GI gastric emptying study? She notes she is waiting on results. Has abdominal pain at times and then has diarrhea. Has alpha gal and avoid beef, pork and dairy products. Having an appt with surgeon Dr. Mavis has a cyst on her left hip. Sees a therapist and psychiatrist. Saw ED specialist dietitian in [place] and didn't want to go back to see her.  Willing to work on eating more consistently.     Latest Ref Rng & Units 07/10/2024    4:05 PM 02/18/2024    9:08 AM 01/18/2024    9:41 AM  CMP  Glucose 70 - 99 mg/dL  866  841   BUN 8 - 23 mg/dL  19  14   Creatinine 9.55 - 1.00 mg/dL  9.33  9.35   Sodium 864 - 145 mmol/L  137  139   Potassium 3.5 - 5.1 mmol/L  4.3  4.3   Chloride 98 - 111 mmol/L  110  110   CO2 22 - 32 mmol/L  21  19   Calcium  8.9 - 10.3 mg/dL  8.9  9.7   Total Protein 6.5 - 8.1 g/dL 7.7  7.0  7.5   Total Bilirubin 0.0 - 1.2 mg/dL 0.4  0.5  0.4   Alkaline Phos 38 - 126 U/L 60  62  61   AST 15 - 41 U/L 17  16  18    ALT 0 - 44 U/L 13  14  17    24  hr recall Dinner chicken, salad and baked potato, water  Diet is insuffient to meet her nutritional and needs. She wants to get back to exercising but is so tired due to insuffient nutrient intake.       Latest Ref Rng & Units 07/10/2024    4:05 PM 02/18/2024    9:08 AM 01/18/2024    9:41 AM  CMP  Glucose 70 - 99 mg/dL  866  841   BUN 8 - 23 mg/dL  19  14   Creatinine 9.55 - 1.00 mg/dL  9.33  9.35   Sodium 864 - 145 mmol/L  137  139   Potassium 3.5 - 5.1 mmol/L  4.3  4.3   Chloride 98 - 111 mmol/L  110  110   CO2 22 - 32 mmol/L  21  19   Calcium  8.9 -  10.3 mg/dL  8.9  9.7   Total Protein 6.5 - 8.1 g/dL 7.7  7.0  7.5   Total Bilirubin 0.0 - 1.2 mg/dL 0.4  0.5  0.4   Alkaline Phos 38 - 126 U/L 60  62  61   AST 15 - 41 U/L 17  16  18    ALT 0 - 44 U/L 13  14  17     Lipid Panel     Component Value Date/Time   CHOL 147 11/15/2023 1040   CHOL 157 03/03/2023 1121   TRIG 147 11/15/2023 1040   HDL 42 11/15/2023 1040   HDL 44 03/03/2023 1121   CHOLHDL 3.5 11/15/2023 1040   VLDL 29 11/15/2023 1040   LDLCALC 76  11/15/2023 1040   LDLCALC 92 03/03/2023 1121   LABVLDL 21 03/03/2023 1121     Anthropometrics  Wt Readings from Last 3 Encounters:  07/23/24 152 lb 6.4 oz (69.1 kg)  07/11/24 154 lb 6.4 oz (70 kg)  05/01/24 169 lb (76.7 kg)   Ht Readings from Last 3 Encounters:  07/23/24 5' 4 (1.626 m)  07/11/24 5' 4 (1.626 m)  05/01/24 5' 4 (1.626 m)   There is no height or weight on file to calculate BMI. @BMIFA @ Facility age limit for growth %iles is 20 years. Facility age limit for growth %iles is 20 years. Clinical Medical Hx: CHF and DM,  Medications: Lantus  50 units a day now, Trulicity  weekly and Metformin .  Labs:  Lab Results  Component Value Date   HGBA1C 6.3 07/23/2024      Latest Ref Rng & Units 07/10/2024    4:05 PM 02/18/2024    9:08 AM 01/18/2024    9:41 AM  CMP  Glucose 70 - 99 mg/dL  866  841   BUN 8 - 23 mg/dL  19  14   Creatinine 9.55 - 1.00 mg/dL  9.33  9.35   Sodium 864 - 145 mmol/L  137  139   Potassium 3.5 - 5.1 mmol/L  4.3  4.3   Chloride 98 - 111 mmol/L  110  110   CO2 22 - 32 mmol/L  21  19   Calcium  8.9 - 10.3 mg/dL  8.9  9.7   Total Protein 6.5 - 8.1 g/dL 7.7  7.0  7.5   Total Bilirubin 0.0 - 1.2 mg/dL 0.4  0.5  0.4   Alkaline Phos 38 - 126 U/L 60  62  61   AST 15 - 41 U/L 17  16  18    ALT 0 - 44 U/L 13  14  17     Notable Signs/Symptoms:    Lifestyle & Dietary  Lives with her sister. Her sister is trying to help her learn how to cook healthier foods and meals. Estimated daily fluid  intake:  64 oz  Working on eating meals on time.   Sleep: varies  Stress / self-care: stress eater Current average weekly physical activity: ADL   B) skipped  L) skipped D) 6pm chicken, baked potato, salad, water  Estimated Energy Needs Calories: 1200 Carbohydrate: 135g Protein: 90g Fat: 33g   NUTRITION DIAGNOSIS  NB-1.1 Food and nutrition-related knowledge deficit As related to Diabetes Type 2.  As evidenced by A1C 7.7%.   NUTRITION INTERVENTION  Nutrition education (E-1) on the following topics:  Lifestyle Medicine - Whole Food, Plant Predominant Nutrition is highly recommended: Eat Plenty of vegetables, Mushrooms, fruits, Legumes, Whole Grains, Nuts, seeds in lieu of processed meats, processed snacks/pastries red meat, poultry, eggs.    -It is better to avoid simple carbohydrates including: Cakes, Sweet Desserts, Ice Cream, Soda (diet and regular), Sweet Tea, Candies, Chips, Cookies, Store Bought Juices, Alcohol in Excess of  1-2 drinks a day, Lemonade,  Artificial Sweeteners, Doughnuts, Coffee Creamers, Sugar-free Products, etc, etc.  This is not a complete list.....  Exercise: If you are able: 30 -60 minutes a day ,4 days a week, or 150 minutes a week.  The longer the better.  Combine stretch, strength, and aerobic activities.  If you were told in the past that you have high risk for cardiovascular diseases, you may seek evaluation by your heart doctor prior to initiating moderate to intense exercise programs.  Handouts Provided Include  .Nutrition  Lifestyle handout  Learning Style & Readiness for Change Teaching method utilized: Visual & Auditory  Demonstrated degree of understanding via: Teach Back  Barriers to learning/adherence to lifestyle change: none  Goals Established by Pt Choose 1 thing to eat for breakfast and lunch daily. Eat dinner daily Eat 3 pieces of fruit per day and 2 vegetables.    MONITORING & EVALUATION Dietary intake, weekly physical  activity, and blood sugars in 3 month.   Next Steps  Get back on track with meal planning and eating meals on time-more plant based foods.

## 2024-07-25 ENCOUNTER — Encounter: Payer: Self-pay | Admitting: *Deleted

## 2024-07-25 ENCOUNTER — Other Ambulatory Visit: Payer: Self-pay | Admitting: *Deleted

## 2024-07-25 DIAGNOSIS — L2989 Other pruritus: Secondary | ICD-10-CM | POA: Diagnosis not present

## 2024-07-25 DIAGNOSIS — L538 Other specified erythematous conditions: Secondary | ICD-10-CM | POA: Diagnosis not present

## 2024-07-25 DIAGNOSIS — L720 Epidermal cyst: Secondary | ICD-10-CM | POA: Diagnosis not present

## 2024-07-25 DIAGNOSIS — R208 Other disturbances of skin sensation: Secondary | ICD-10-CM | POA: Diagnosis not present

## 2024-07-26 ENCOUNTER — Other Ambulatory Visit: Payer: Self-pay | Admitting: *Deleted

## 2024-07-26 DIAGNOSIS — R2242 Localized swelling, mass and lump, left lower limb: Secondary | ICD-10-CM

## 2024-07-30 ENCOUNTER — Encounter: Payer: Self-pay | Admitting: General Surgery

## 2024-07-30 ENCOUNTER — Ambulatory Visit (INDEPENDENT_AMBULATORY_CARE_PROVIDER_SITE_OTHER): Admitting: General Surgery

## 2024-07-30 VITALS — BP 144/64 | HR 73 | Temp 97.8°F | Resp 18 | Ht 64.0 in | Wt 150.0 lb

## 2024-07-30 DIAGNOSIS — R2242 Localized swelling, mass and lump, left lower limb: Secondary | ICD-10-CM | POA: Insufficient documentation

## 2024-07-30 DIAGNOSIS — R222 Localized swelling, mass and lump, trunk: Secondary | ICD-10-CM | POA: Insufficient documentation

## 2024-07-30 NOTE — Progress Notes (Signed)
 [address] [place] [address] Dept: [phone]  FOLLOW UP NOTE  Patient ID: Laura [name], female    DOB: 03/25/1961  Age: 63 y.o. MRN: [id number] Date of Office Visit: 07/31/2024  Assessment  Chief Complaint: Allergic Rhinitis  (Pt states lately she has been having a runny/ stuffy nose. ) and Asthma (She states she is having some breathing difficulty but not using inhaler as much.)  HPI Laura [name] is a 63 year old female who presents to the clinic for follow-up visit.  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  Discussed the use of AI scribe software for clinical note transcription with the patient, who gave verbal consent to proceed.  History of Present Illness Laura [name] is a 63 year old female who presents with pain and concerns about a newly discovered cyst. She discovered a cyst that was missed in a 2024 CT scan. The cyst is causing significant pain, described as radiating and shooting, particularly when walking. The pain is severe enough to require sleeping on an ice pack. She has a history of a previous cyst removal by a dermatologist, which bled significantly due to her blood thinner medication.  She has a history of shortness of breath, which has improved, allowing her to walk to her mailbox without difficulty. She experiences some wheezing and a little coughing, with mucus ranging in color from clear to green. She is currently using a Symbicort  160 inhaler and continues to follow-up with [facility] specialty team.  She continues Tezspire  injections once a month with no large or local reaction. She reports a significant decrease in her symptoms of asthma while continuing on Tezspire  injections. She is attempting to quit smoking, currently smoking a few packs a week.  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 She continues to use a CPAP machine nightly to prevent airway obstruction.  She denies infections requiring antibiotics since her last visit to this clinic.  She continues Cuvitru  23 g every other week infusions with no adverse reaction. Her last immunoglobulin lab on 11/15/2023 indicate IgG 1561, IgA 38, and IgM 76   She also experiences allergy symptoms, including a runny and stuffy nose, and sneezing. She continues montelukast  and is not currently using a nasal steroid spray, nasal saline mist, or antihistamine. Her last environmental allergy testing via lab on 02/08/2018 was negative to the environmental panel.  She has alpha-gal syndrome and is avoiding mammal meat and dairy products with no accidental ingestion or EpiPen  use since her last visit to this clinic. Chart review indicates labs from May 2025: Total IgE 8 , alpha gal IgE 0.11A (equivocal/low) pork IgE <0.1 beef IgE <0.1,  lamb IgE <0.1.   Her current medications are listed in the chart.   Drug Allergies:  Allergies  Allergen Reactions   Alpha-Gal    Ativan [Lorazepam] Hives   Phenergan [Promethazine Hcl] Hives    Physical Exam: BP (!) 142/76 (BP Location: Left Arm, Patient Position: Sitting, Cuff Size: Normal)   Pulse 82   Temp 98 F (36.7 C) (Temporal)   Wt 150 lb 6.4 oz (68.2 kg)   SpO2 99%   BMI 25.82 kg/m    Physical Exam Vitals reviewed.  Constitutional:      Appearance: Normal appearance.  HENT:  Head: Normocephalic and atraumatic.     Right Ear: Tympanic membrane normal.     Left Ear: Tympanic membrane normal.     Nose:     Comments: Bilateral nares slightly erythematous with thin clear nasal drainage noted.  Pharynx normal.  Ears normal.  Eyes normal.    Mouth/Throat:     Pharynx: Oropharynx is clear.  Eyes:     Conjunctiva/sclera: Conjunctivae normal.  Cardiovascular:     Rate and Rhythm: Normal rate and  regular rhythm.     Heart sounds: Normal heart sounds. No murmur heard. Pulmonary:     Effort: Pulmonary effort is normal.     Breath sounds: Normal breath sounds.     Comments: Lungs clear to auscultation Musculoskeletal:        General: Normal range of motion.     Cervical back: Normal range of motion and neck supple.  Skin:    General: Skin is warm and dry.  Neurological:     Mental Status: She is alert and oriented to person, place, and time.  Psychiatric:        Mood and Affect: Mood normal.        Behavior: Behavior normal.        Thought Content: Thought content normal.        Judgment: Judgment normal.     Diagnostics: FVC 2.38 which is 98% of predicted value, FEV1 2.04 which is 90% of predicted value.  Spirometry indicates normal ventilatory function.  Assessment and Plan: 1. Common variable immunodeficiency (HCC)   2. Non-allergic rhinitis   3. Severe persistent asthma, uncomplicated (HCC)   4. Gastroesophageal reflux disease, unspecified whether esophagitis present   5. Tobacco use   6. Allergy to alpha-gal      Patient Instructions  1. Hypogammaglobulinemia - Continue with Cuvitru  infusions23 grams every 2 weeks - We might consider increasing the dosage if this frequency of infections worsen.   2. Chronic rhinitis   - Continue with nasal saline rinses as needed.  - Continue with Nasacort  daily as instructed by your primary care provider.  - You can use this with your Astepro /azelastine . - We can do skin testing at some point if needed.   3. Severe persistent asthma, uncomplicated Lung testing looks good today- Continue to follow up with Dr. Darlean - Continue Tezspire  injections once every 4 weeks for asthma control - Asthma control goals:  * Full participation in all desired activities (may need albuterol  before activity) * Albuterol  use two time or less a week on average (not counting use with activity) * Cough interfering with sleep two time or less a  month * Oral steroids no more than once a year * No hospitalizations.  4.  Reflux  -Continue dietary lifestyle modifications as listed below  -Continue to follow up with your GI specialist as recommended  5.  Tobacco use - Consider cutting down and better to quit  6. Alpha gal - Continue to avoid mammalian meats.  In case of an allergic reaction,take cetirizine 10 mg once every 12-24 hours, and if life-threatening symptoms occur, inject with EpiPen  0.3 mg.   Call the clinic if this treatment plan is not working well for you  Follow up in 6 months or sooner if needed  It was a pleasure to see you again today!   Return in about 6 months (around 01/29/2025), or if symptoms worsen or fail to improve.    Thank you for the opportunity to care for this patient.  Please do not hesitate to contact me with questions.  Laura Mutter, FNP [facility] 

## 2024-07-30 NOTE — Progress Notes (Unsigned)
 [facility] History and Physical  Reason for Referral:*** Referring Physician: ***  Chief Complaint   New Patient (Initial Visit)     Corean Yoshimura is a 63 y.o. female.  HPI:   Discussed the use of AI scribe software for clinical note transcription with the patient, who gave verbal consent to proceed.  History of Present Illness      ***.  The *** started *** and has had a duration of ***.  It is associated with ***.  The *** is improved with ***, and is made worse with ***.    Quality*** Context***  Past Medical History:  Diagnosis Date  . Anemia   . Aortic atherosclerosis   . Asthma   . Atrial fibrillation and flutter (HCC)    a.) CHA2DS2VASc = 6 (sex, HTN, TIA x2, vascular disease history, T2DM);  b.) DCCV ~ 2009; c.) DCCV 08/29/2018 (200J x1 --> SR with 1st degree AVB); d.) rate/rhythm maintained on oral diltiazem  + bisoprolol ; chronically anticoagulated with apixaban   . Bipolar disorder (HCC)   . Carpal tunnel syndrome of right wrist   . CHB (complete heart block) (HCC)    a.) s/p St. Jude dual chamber PPM placement  . CHF (congestive heart failure) (HCC)   . Complication of anesthesia    a.) allergic reaction to perioperative analgesics + anesthetic medications in [place] ; unsure of combination; not able to recount reactions/events --> states I ended up in the ICU for 5 days  . Constipation    a.) on linaclotide   . COPD (chronic obstructive pulmonary disease) (HCC)   . DDD (degenerative disc disease), thoracic   . GERD (gastroesophageal reflux disease)   . H/O congenital atrial septal defect (ASD) repair   . Hallux valgus of right foot   . Hammertoe of second toe of right foot   . History of 2019 novel coronavirus disease (COVID-19)    a.) 10/2020; b.) 05/2021  . HLD (hyperlipidemia)   . HTN (hypertension)   . Hypogammaglobulinemia    a.) on Curitru  . Hypothyroid   . IgA deficiency (HCC)   . IgE deficiency   . Long term current  use of anticoagulant    a.) apixaban   . Lumbar stenosis   . Migraines   . Mitral stenosis 02/08/2022   a.) TTE 02/08/2022: EF 60-65%, mild MAC, mild MV stenosis (peak grad 14.6 / mean grad 4.0)  . Neuropathy   . NSVT (nonsustained ventricular tachycardia) (HCC) 10/31/2018   a.) Holter 10/31/2018: 6 beat run  . Obesity   . OSA on CPAP   . Osteoarthritis   . 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 Jude dual chamber device; placed for symptomatic bradycardia secondary to intermittent CHB  . PTSD (post-traumatic stress disorder)   . Pulmonary nodules   . Recurrent sinusitis   . Short-term memory loss   . Sleep difficulties    a.) takes melatonin  . Symptomatic bradycardia    a.) s/p St. Jude dual chamber PPM placement  . Syncope   . TIA (transient ischemic attack)    x3-last one in 2015  . Tremors of nervous system   . Type 2 diabetes mellitus (HCC)     Past Surgical History:  Procedure Laterality Date  . 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 BALLOON DILATION N/A 11/27/2023   Procedure: BALLOON DILATION;  Surgeon: Cindie Carlin POUR, DO;  [facility];  Service: Endoscopy;  Laterality: N/A;  9:00 am, asa 3  .  BIOPSY  01/26/2021   Procedure: BIOPSY;  Surgeon: Cindie Carlin POUR, DO;  [facility]: [facility];  Service: Endoscopy;;  gastric  . BIOPSY  11/27/2023   Procedure: BIOPSY;  Surgeon: Cindie Carlin POUR, DO;  [facility];  Service: Endoscopy;;  . CARDIOVERSION  2009  . CARDIOVERSION N/A 08/29/2018   Procedure: CARDIOVERSION;  Surgeon: Alvan Dorn FALCON, MD;  [facility]: [facility];  Service: Endoscopy;  Laterality: N/A;  . CARPAL TUNNEL RELEASE Bilateral   . CESAREAN SECTION  1986, 1988, 1994  . COLONOSCOPY N/A 02/15/2024   Procedure: COLONOSCOPY;  Surgeon: Cindie Carlin POUR, DO;  [facility];  Service: Endoscopy;  Laterality: N/A;  8:30 am, asa 3  . COLONOSCOPY WITH PROPOFOL  N/A 01/26/2021   Procedure: COLONOSCOPY WITH PROPOFOL ;   Surgeon: Cindie Carlin POUR, DO;  [facility];  Service: Endoscopy;  Laterality: N/A;  am appt, diabetic  . ESOPHAGOGASTRODUODENOSCOPY (EGD) WITH PROPOFOL  N/A 01/26/2021   Procedure: ESOPHAGOGASTRODUODENOSCOPY (EGD) WITH PROPOFOL ;  Surgeon: Cindie Carlin POUR, DO;  [facility];  Service: Endoscopy;  Laterality: N/A;  . ESOPHAGOGASTRODUODENOSCOPY (EGD) WITH PROPOFOL  N/A 11/27/2023   Procedure: ESOPHAGOGASTRODUODENOSCOPY (EGD) WITH PROPOFOL ;  Surgeon: Cindie Carlin POUR, DO;  [facility];  Service: Endoscopy;  Laterality: N/A;  9:00 am, asa 3  . HALLUX VALGUS [place]IN Right 07/29/2022   Procedure: HALLUX VALGUS;  Surgeon: Silva Juliene SAUNDERS, DPM;  Location: [facility];  Service: Podiatry;  Laterality: Right;  . HAMMER TOE SURGERY Right 07/29/2022   Procedure: HAMMER TOE CORRECTION 2ND;  Surgeon: Silva Juliene SAUNDERS, DPM;  Location: [facility];  Service: Podiatry;  Laterality: Right;  . LEFT HEART CATH AND CORONARY ANGIOGRAPHY N/A 08/30/2019   Procedure: LEFT HEART CATH AND CORONARY ANGIOGRAPHY;  Surgeon: Dann Candyce RAMAN, MD;  Location: [facility] INVASIVE CV LAB;  Service: Cardiovascular;  Laterality: N/A;  . NASAL SINUS SURGERY  2017  . PACEMAKER IMPLANT N/A 03/03/2022   Procedure: PACEMAKER IMPLANT;  Surgeon: Waddell Danelle ORN, MD;  Location: [facility] INVASIVE CV LAB;  Service: Cardiovascular;  Laterality: N/A;  . POLYPECTOMY  01/26/2021   Procedure: POLYPECTOMY;  Surgeon: Cindie Carlin POUR, DO;  [facility];  Service: Endoscopy;;  colon  . TOOTH EXTRACTION N/A 07/04/2023   Procedure: DENTAL RESTORATION/EXTRACTIONS;  Surgeon: Sheryle Hamilton, DMD;  Location: [facility];  Service: Oral Surgery;  Laterality: N/A;    Family History  Problem Relation Age of Onset  . Schizophrenia Mother   . Hypertension Mother   . Stroke Mother   . Kidney disease Mother   . Heart attack Mother   . Alcohol abuse Mother   . Colon cancer Father        colon cancer  . Kidney disease Sister   .  Hypertension Sister   . Bipolar disorder Sister   . Atrial fibrillation Sister   . Schizophrenia Sister   . Heart disease Brother   . Prostate cancer Brother   . Stroke Maternal Aunt   . Heart disease Maternal Aunt   . Thyroid  disease Daughter   . Hashimoto's thyroiditis Daughter   . Celiac disease Daughter   . Allergic rhinitis Daughter   . Asthma Neg Hx     Social History   Tobacco Use  . Smoking status: Former    Current packs/day: 0.00    Average packs/day: 1 pack/day for 28.0 years (28.0 ttl pk-yrs)    Types: Cigarettes    Start date: 22    Quit  date: 2008    Years since quitting: 17.8  . Smokeless tobacco: Never  Vaping Use  . Vaping status: Never Used  Substance Use Topics  . Alcohol use: Not Currently  . Drug use: Not Currently    Medications: {medication reviewed/display:3041432} Allergies as of 07/30/2024       Reactions   Alpha-gal    Ativan [lorazepam] Hives   Phenergan [promethazine Hcl] Hives        Medication List        Accurate as of July 30, 2024  9:09 AM. If you have any questions, ask your nurse or doctor.          acetaminophen  500 MG tablet Commonly known as: TYLENOL  Take 500 mg by mouth every 8 (eight) hours as needed.   albuterol  (2.5 MG/3ML) 0.083% nebulizer solution Commonly known as: PROVENTIL  Take 2.5 mg by nebulization every 4 (four) hours as needed for wheezing.   atorvastatin  20 MG tablet Commonly known as: LIPITOR Take 20 mg by mouth at bedtime.   Azelastine  HCl 137 MCG/SPRAY Soln Place 1 spray into both nostrils daily.   bisoprolol  10 MG tablet Commonly known as: ZEBETA  Take 1 tablet (10 mg total) by mouth daily.   blood glucose meter kit and supplies 1 each by Other route 4 (four) times daily. Dispense based on patient and insurance preference. Use up to four times daily as directed. (FOR ICD-10 E10.9, E11.9).   Breo Ellipta  100-25 MCG/ACT Aepb Generic drug: fluticasone  furoate-vilanterol Inhale 1  puff into the lungs daily.   CUVITRU  [place] Inject 27 mg into the skin every 14 (fourteen) days.   diltiazem  300 MG 24 hr capsule Commonly known as: Cartia  XT Take 1 capsule (300 mg total) by mouth daily.   divalproex  500 MG 24 hr tablet Commonly known as: DEPAKOTE  ER Take 1 tablet (500 mg total) by mouth daily AND 2 tablets (1,000 mg total) at bedtime.   ELDERBERRY PO Take 4 g by mouth daily. Gummies 2g each   Eliquis  5 MG Tabs tablet Generic drug: apixaban  TAKE 1 TABLET BY MOUTH TWICE  DAILY   EPINEPHrine  0.3 mg/0.3 mL Soaj injection Commonly known as: EPI-PEN Inject 0.3 mg into the muscle once as needed for anaphylaxis.   FERROUS SULFATE  PO Take 65 mg by mouth every evening.   FreeStyle Libre 3 Sensor Misc 1 Piece by Does not apply route every 14 (fourteen) days. Place 1 sensor on the skin every 14 days. Use to check glucose continuously   gabapentin  600 MG tablet Commonly known as: NEURONTIN  Take 600 mg by mouth 3 (three) times daily.   hydrOXYzine  25 MG tablet Commonly known as: ATARAX  TAKE ONE TABLET BY MOUTH THREE TIMES DAILY AS NEEDED FOR ANXIETY OR FOR ITCHING   Jardiance  10 MG Tabs tablet Generic drug: empagliflozin  TAKE 1 TABLET BY MOUTH DAILY  BEFORE BREAKFAST   levalbuterol  1.25 MG/3ML nebulizer solution Commonly known as: XOPENEX  Use 1 vial in the nebulizer every 6 hours as needed for cough, wheeze, shortness of breath or chest tightness.   levalbuterol  45 MCG/ACT inhaler Commonly known as: XOPENEX  HFA USE 2 INHALATIONS BY MOUTH EVERY 6 HOURS AS NEEDED FOR WHEEZING   levothyroxine  112 MCG tablet Commonly known as: SYNTHROID  Take 112 mcg by mouth daily before breakfast.   losartan  50 MG tablet Commonly known as: COZAAR  Take 50 mg by mouth every morning.   MELATONIN GUMMIES PO Take 10 mg by mouth at bedtime.   metFORMIN  500 MG tablet Commonly known  as: GLUCOPHAGE  Take 500 mg by mouth 2 (two) times daily.   montelukast  10 MG tablet Commonly  known as: SINGULAIR  TAKE 1 TABLET BY MOUTH AT  BEDTIME   multivitamin with minerals tablet Take 1 tablet by mouth daily. Woman   mupirocin  ointment 2 % Commonly known as: BACTROBAN  Apply 1 Application topically 2 (two) times daily.   Nebulizer Misc 1 Device by Other route as directed. Nebulizer tubing kit   ondansetron  8 MG disintegrating tablet Commonly known as: ZOFRAN -ODT Take 8 mg by mouth daily as needed for vomiting or nausea.   OneTouch Ultra test strip Generic drug: glucose blood 1 each by Other route in the morning, at noon, in the evening, and at bedtime.   Pen Needles 3/16 31G X 5 MM Misc Use as directed with insulin  pen   BD Pen Needle Nano 2nd Gen 32G X 4 MM Misc Generic drug: Insulin  Pen Needle 1 each by Other route in the morning, at noon, in the evening, and at bedtime.   Potassium Chloride  ER 20 MEQ Tbcr Take 20 mEq by mouth daily.   sertraline  25 MG tablet Commonly known as: ZOLOFT  Take 1 tablet (25 mg total) by mouth at bedtime.   Spacer/Aero-Holding [employer] 1 Device by Does not apply route as directed.   Tezspire  210 MG/1. Soaj Generic drug: Tezepelumab -ekko Inject 210 mg into the skin every 28 (twenty-eight) days.   topiramate  100 MG tablet Commonly known as: TOPAMAX  TAKE 2  TABLETS BY MOUTH  IN THE MORNING AND 1 TABLET IN  THE EVENING   triamcinolone  cream 0.1 % Commonly known as: KENALOG  Apply 1 Application topically 2 (two) times daily.   Trulicity  3 MG/0.5ML Soaj Generic drug: Dulaglutide  Inject 3 mg into the skin once a week. What changed: Another medication with the same name was removed. Continue taking this medication, and follow the directions you see here. Changed by: [name] JAYSON Pander   Vitamin D3 125 MCG (5000 UT) Caps Take 1 capsule (5,000 Units total) by mouth daily.         ROS:  {Review of Systems:30496}  Blood pressure (!) 144/64, pulse 73, temperature 97.8 F (36.6 C), temperature source Oral, resp.  rate 18, height 5' 4 (1.626 m), weight 150 lb (68 kg), SpO2 94%. Physical Exam Physical Exam   Results: No results found. However, due to the size of the patient record, not all encounters were searched. Please check Results Review for a complete set of results.  No results found.   Assessment and Plan: Assessment and Plan Assessment & Plan      Caddie Randle is a 63 y.o. female with *** -*** -*** -Follow up ***  All questions were answered to the satisfaction of the patient and family***.  The risk and benefits of *** were discussed including but not limited to ***.  After careful consideration, Jaeline Fraley has decided to ***.    [name] JAYSON Pander 07/30/2024, 9:09 AM

## 2024-07-30 NOTE — Patient Instructions (Incomplete)
 1. Hypogammaglobulinemia - Continue with Cuvitru . Let's decrease to 23 grams every 2 weeks - We might consider increasing the dosage if this frequency of infections worsen.   2. Chronic rhinitis   - Continue with nasal saline rinses as needed.  - Continue with Nasacort  daily as instructed by your primary care provider.  - You can use this with your Astepro /azelastine . - We can do skin testing at some point if needed.   3. Severe persistent asthma, uncomplicated Lung testing looks good today but you sound terrible. Continue amoxicillin  as previously prescribed. If your symptoms worsen call the clinic - Begin budesonide  0.5 mg via nebulizer twice a day for the next 1-2 weeks or until cough and wheeze free, then stop - Daily medication: Continue Breo167mcg one puff once daily + montelukast  10 mg once a day + Fasenra  every 8 weeks - Prior to physical activity: Xopenex  2 puffs 10-15 minutes before physical activity. - Rescue medications: Xopenex  nebulizer every 4-6 hours as needed, Xopenex  4 puffs every 4-6 hours as needed, or DuoNeb nebulizer one vial every 4-6 hours as needed - Continue Fasenra  injections once every 8 weeks for asthma control - Asthma control goals:  * Full participation in all desired activities (may need albuterol  before activity) * Albuterol  use two time or less a week on average (not counting use with activity) * Cough interfering with sleep two time or less a month * Oral steroids no more than once a year * No hospitalizations.  4.  Reflux  -Continue dietary lifestyle modifications as listed below  -Continue to follow up with your GI specialist as recommended  5.  Tobacco use - Consider cutting down and better to quit  6. Alpha gal - Continue to avoid mammalian meats.  In case of an allergic reaction,take cetirizine 10 mg once every 12-24 hours, and if life-threatening symptoms occur, inject with EpiPen  0.3 mg. Follow up in 3 months or sooner if needed  It was a  pleasure to see you again today!  Call the clinic if this treatment plan is not working well for you  Follow up in 3 months or sooner if needed.

## 2024-07-31 ENCOUNTER — Encounter: Payer: Self-pay | Admitting: Family Medicine

## 2024-07-31 ENCOUNTER — Telehealth: Payer: Self-pay

## 2024-07-31 ENCOUNTER — Ambulatory Visit: Admitting: Family Medicine

## 2024-07-31 VITALS — BP 142/76 | HR 82 | Temp 98.0°F | Wt 150.4 lb

## 2024-07-31 DIAGNOSIS — J310 Chronic rhinitis: Secondary | ICD-10-CM

## 2024-07-31 DIAGNOSIS — D839 Common variable immunodeficiency, unspecified: Secondary | ICD-10-CM | POA: Diagnosis not present

## 2024-07-31 DIAGNOSIS — K219 Gastro-esophageal reflux disease without esophagitis: Secondary | ICD-10-CM

## 2024-07-31 DIAGNOSIS — Z720 Tobacco use: Secondary | ICD-10-CM

## 2024-07-31 DIAGNOSIS — Z91018 Allergy to other foods: Secondary | ICD-10-CM

## 2024-07-31 DIAGNOSIS — J4550 Severe persistent asthma, uncomplicated: Secondary | ICD-10-CM | POA: Diagnosis not present

## 2024-07-31 MED ORDER — EPINEPHRINE 0.3 MG/0.3ML IJ SOAJ: 0.3000 mg | INTRAMUSCULAR | 1 refills | Status: DC | PRN

## 2024-07-31 MED ORDER — EPINEPHRINE 0.3 MG/0.3ML IJ SOAJ: 0.3000 mg | Freq: Once | INTRAMUSCULAR | 1 refills | Status: AC | PRN

## 2024-07-31 NOTE — Addendum Note (Signed)
 Addended by: TERESSA KNEE B on: 07/31/2024 04:30 PM   Modules accepted: Orders

## 2024-07-31 NOTE — Telephone Encounter (Signed)
 Laura [name],   Pt states epi pen needs updated, she requested rx to be sent to her [facility] mail order pharmacy. Epi pen rx sent as requested.  Thanks! -[employer]

## 2024-08-01 ENCOUNTER — Ambulatory Visit (INDEPENDENT_AMBULATORY_CARE_PROVIDER_SITE_OTHER): Admitting: Internal Medicine

## 2024-08-01 ENCOUNTER — Encounter: Payer: Self-pay | Admitting: Internal Medicine

## 2024-08-01 VITALS — BP 143/82 | HR 69 | Ht 64.0 in | Wt 149.4 lb

## 2024-08-01 DIAGNOSIS — J4489 Other specified chronic obstructive pulmonary disease: Secondary | ICD-10-CM

## 2024-08-01 DIAGNOSIS — F17210 Nicotine dependence, cigarettes, uncomplicated: Secondary | ICD-10-CM

## 2024-08-01 NOTE — Addendum Note (Signed)
 Addended by: SAUNDRA TAWNI DEL on: 08/01/2024 10:11 AM   Modules accepted: Orders

## 2024-08-01 NOTE — Assessment & Plan Note (Addendum)
 Counseled re importance of smoking cessation but did not meet time criteria for separate billing           Each maintenance medication was reviewed in detail including emphasizing most importantly the difference between maintenance and prns and under what circumstances the prns are to be triggered using an action plan format where appropriate.  Total time for H and P, chart review, counseling, reviewing hfa/ neb device(s) and generating customized AVS unique to this office visit / same day charting = 320 min summary f/u ov

## 2024-08-01 NOTE — Assessment & Plan Note (Addendum)
 Quit smoking 2008 s need for inhalers at that point  - failed fosenra so started on Tespire 03/2024 by Allergy  - Spirometry 04/26/24   FEV1 1.66 (73%)  Ratio 0.67  and no curvature on exp and flat truncated insp curve   - 05/01/2024  After extensive coaching inhaler device,  effectiveness =    80% on hfa s spacer> symb 160 trial in place of advair  - 08/01/2024   improved doe > no change in rx      - The proper method of use, as well as anticipated side effects, of a metered-dose inhaler were discussed and demonstrated to the patient using teach back method. Improved effectiveness after extensive coaching during this visit to a level of approximately80 % from a baseline of 50 % so contintinue symbicort  160 / tezpire and prn saba :  Re SABA :  I spent extra time with pt today reviewing appropriate use of albuterol  for prn use on exertion with the following points: 1) saba is for relief of sob that does not improve by walking a slower pace or resting but rather if the pt does not improve after trying this first. 2) If the pt is convinced, as many are, that saba helps recover from activity faster then it's easy to tell if this is the case by re-challenging : ie stop, take the inhaler, then p 5 minutes try the exact same activity (intensity of workload) that just caused the symptoms and see if they are substantially diminished or not after saba 3) if there is an activity that reproducibly causes the symptoms, try the saba 15 min before the activity on alternate days   If in fact the saba really does help, then fine to continue to use it prn but advised may need to look closer at the maintenance regimen being used to achieve better control of airways disease with exertion.   F/u in this clinic can be prn

## 2024-08-01 NOTE — Patient Instructions (Addendum)
 Work on inhaler technique:  relax and gently blow all the way out then take a nice smooth full deep breath back in, triggering the inhaler at same time you start breathing in.  Hold breath in for at least  5 seconds if you can. Blow out symbicort   thru nose. Rinse and gargle with water when done.  If mouth or throat bother you at all,  try brushing teeth/gums/tongue with arm and hammer toothpaste/ make a slurry and gargle and spit out.   Give yourself an albuterol  nebulizer right before you leave for your surgery  Keep appt  08/05/24 and follow up is as needed

## 2024-08-01 NOTE — Progress Notes (Signed)
 Laura [name], female    DOB: [birth year]    MRN: [id number]   Brief patient profile:  30  yowf  active smoker  former [place] pt self-referred back to pulmonary clinic in [place]  05/01/2024  for asthma    CT chest 03/2022 clear CTA chest 03/2021 >> RT hilar LN 13 mm, no nodule   CTA 09/18/19 - No pulmonary embolism. Nodule ~70mm in right lung base. Minimal right pleural effusion.   04/2021 esophagram >> small hiatal hernia   09/2022 Npsg  (210 lbs ) no  significant OSA.  Showed limb movements   NPSG 11/06/2018  AHI of 11.4/h and RDI of 11.7/h.  HST 03/2010 >> 257 lbs -  AHI 14/h   PFT: 02/2022 ratio 73, FEV1 75%, FVC 81% 11/2021 ratio 75, FEV1 78%, FVC 82% 07/2021 ratio 72, FEV1 67%, FVC 71% 04/2021 Spirometry ratio 70, FEV1 1.60/64%, FVC 2.8/71%   11/2020  Spirometry:  (FEV1: 1.61/64%, FVC: 2.14/66%, FEV1/FVC: 75%).       History of Present Illness  05/01/2024  Pulmonary/ 1st office eval/ Eila Runyan / [facility]  on [place] started  June 2025 and maint on BREO / Montlukast last pred x months prior to [employer] Complaint  Patient presents with   Follow-up   Asthma    TOC  Dyspnea:  walks 10 m and stops  -  shops [employer] leaning on cart  Cough: minimal green mucus / slt nasal > amox one week Sleep: fine is flat one pillow  SABA use: avg levo  3-4 x day  02 ldz:wnwz  Rec Plan A = Automatic = Always=   Replace the Breo with Symbicort  160 Take 2 puffs first thing in am and then another 2 puffs about 12 hours later and hold off the budesonide  for now  Work on inhaler technique:     Plan B = Backup (to supplement plan A, not to replace it) Use your levoalbuterol inhaler as a rescue medication  Plan C = Crisis (instead of Plan B but only if Plan B stops working) - only use your albuterol  nebulizer if you first try Plan B  We will have you return to sleep medicine NP here ([place] office is alternative) Please schedule a follow up visit in 3 months but call sooner if needed  with all  repiratory medications /inhalers/ solutions   08/01/2024  f/u ov/[place] office/Nalina Yeatman re: AB maint on symbicort  160/ tezpire   still smoking some  Chief Complaint  Patient presents with   Asthma    F/u    Dyspnea:  mb ok s stick, shopping ok  Cough: improved Sleeping: flat bed/ one pillow s   resp cc  SABA use: rarely  02: none     No obvious day to day or daytime variability or assoc excess/ purulent sputum or mucus plugs or hemoptysis or cp or chest tightness, subjective wheeze or overt hb symptoms.    Also denies any obvious fluctuation of symptoms with weather or environmental changes or other aggravating or alleviating factors except as outlined above   No unusual exposure hx or h/o childhood pna/ asthma or knowledge of premature birth.  Current Allergies, Complete Past Medical History, Past Surgical History, Family History, and Social History were reviewed in [employer] record.  ROS  The following are not active complaints unless bolded Hoarseness, sore throat, dysphagia, dental problems, itching, sneezing,  nasal congestion or discharge of excess mucus or purulent secretions, ear ache,  fever, chills, sweats, unintended wt loss or wt gain, classically pleuritic or exertional cp,  orthopnea pnd or arm/hand swelling  or leg swelling, presyncope, palpitations, abdominal pain, anorexia, nausea, vomiting, diarrhea  or change in bowel habits or change in bladder habits, change in stools or change in urine, dysuria, hematuria,  rash, arthralgias, visual complaints, headache, numbness, weakness or ataxia or problems with walking or coordination,  change in mood or  memory.        Current Meds  Medication Sig   acetaminophen  (TYLENOL ) 500 MG tablet Take 500 mg by mouth every 8 (eight) hours as needed.   albuterol  (PROVENTIL ) (2.5 MG/3ML) 0.083% nebulizer solution Take 2.5 mg by nebulization every 4 (four) hours as needed for wheezing.   atorvastatin  (LIPITOR)  20 MG tablet Take 20 mg by mouth at bedtime.   Azelastine  HCl 137 MCG/SPRAY SOLN Place 1 spray into both nostrils daily.   BD PEN NEEDLE NANO 2ND GEN 32G X 4 MM MISC 1 each by Other route in the morning, at noon, in the evening, and at bedtime.   bisoprolol  (ZEBETA ) 10 MG tablet Take 1 tablet (10 mg total) by mouth daily.   blood glucose meter kit and supplies 1 each by Other route 4 (four) times daily. Dispense based on patient and insurance preference. Use up to four times daily as directed. (FOR ICD-10 E10.9, E11.9).   Cholecalciferol (VITAMIN D3) 125 MCG (5000 UT) CAPS Take 1 capsule (5,000 Units total) by mouth daily.   Continuous Glucose Sensor (FREESTYLE LIBRE 3 SENSOR) MISC 1 Piece by Does not apply route every 14 (fourteen) days. Place 1 sensor on the skin every 14 days. Use to check glucose continuously   diltiazem  (CARTIA  XT) 300 MG 24 hr capsule Take 1 capsule (300 mg total) by mouth daily.   divalproex  (DEPAKOTE  ER) 500 MG 24 hr tablet Take 1 tablet (500 mg total) by mouth daily AND 2 tablets (1,000 mg total) at bedtime.   Dulaglutide  (TRULICITY ) 3 MG/0.5ML SOAJ Inject 3 mg into the skin once a week.   ELDERBERRY PO Take 4 g by mouth daily. Gummies 2g each   ELIQUIS  5 MG TABS tablet TAKE 1 TABLET BY MOUTH TWICE  DAILY   EPINEPHrine  0.3 mg/0.3 mL IJ SOAJ injection Inject 0.3 mLs (0.3 mg total) into the muscle once for 1 dose. As needed for life-threatening allergic reactions   EPINEPHrine  0.3 mg/0.3 mL IJ SOAJ injection Inject 0.3 mg into the muscle once as needed for anaphylaxis.   EPINEPHrine  0.3 mg/0.3 mL IJ SOAJ injection Inject 0.3 mg into the muscle as needed for anaphylaxis. As needed for life-threatening allergic reactions   FERROUS SULFATE  PO Take 65 mg by mouth every evening.   fluticasone  furoate-vilanterol (BREO ELLIPTA ) 100-25 MCG/ACT AEPB Inhale 1 puff into the lungs daily.   gabapentin  (NEURONTIN ) 600 MG tablet Take 600 mg by mouth 3 (three) times daily.   hydrOXYzine   (ATARAX ) 25 MG tablet TAKE ONE TABLET BY MOUTH THREE TIMES DAILY AS NEEDED FOR ANXIETY OR FOR ITCHING   Immune Globulin , Human, (CUVITRU  [place]) Inject 27 mg into the skin every 14 (fourteen) days.   Insulin  Pen Needle (PEN NEEDLES 3/16) 31G X 5 MM MISC Use as directed with insulin  pen   JARDIANCE  10 MG TABS tablet TAKE 1 TABLET BY MOUTH DAILY  BEFORE BREAKFAST   levalbuterol  (XOPENEX  HFA) 45 MCG/ACT inhaler USE 2 INHALATIONS BY MOUTH EVERY 6 HOURS AS NEEDED FOR WHEEZING   levalbuterol  (XOPENEX ) 1.25 MG/3ML nebulizer  solution Use 1 vial in the nebulizer every 6 hours as needed for cough, wheeze, shortness of breath or chest tightness.   levothyroxine  (SYNTHROID , LEVOTHROID) 112 MCG tablet Take 112 mcg by mouth daily before breakfast.   losartan  (COZAAR ) 50 MG tablet Take 50 mg by mouth every morning.   MELATONIN GUMMIES PO Take 10 mg by mouth at bedtime.   metFORMIN  (GLUCOPHAGE ) 500 MG tablet Take 500 mg by mouth 2 (two) times daily.   montelukast  (SINGULAIR ) 10 MG tablet TAKE 1 TABLET BY MOUTH AT  BEDTIME   Multiple Vitamins-Minerals (MULTIVITAMIN WITH MINERALS) tablet Take 1 tablet by mouth daily. Woman   mupirocin  ointment (BACTROBAN ) 2 % Apply 1 Application topically 2 (two) times daily.   Nebulizer MISC 1 Device by Other route as directed. Nebulizer tubing kit   ondansetron  (ZOFRAN -ODT) 8 MG disintegrating tablet Take 8 mg by mouth daily as needed for vomiting or nausea.   ONETOUCH ULTRA test strip 1 each by Other route in the morning, at noon, in the evening, and at bedtime.   Potassium Chloride  ER 20 MEQ TBCR Take 20 mEq by mouth daily.   sertraline  (ZOLOFT ) 25 MG tablet Take 1 tablet (25 mg total) by mouth at bedtime.   Spacer/Aero-Holding Chambers DEVI 1 Device by Does not apply route as directed.   Tezepelumab -ekko (TEZSPIRE ) 210 MG/1. SOAJ Inject 210 mg into the skin every 28 (twenty-eight) days.   topiramate  (TOPAMAX ) 100 MG tablet TAKE 2  TABLETS BY MOUTH  IN THE MORNING AND 1  TABLET IN  THE EVENING   triamcinolone  cream (KENALOG ) 0.1 % Apply 1 Application topically 2 (two) times daily.   Current Facility-Administered Medications for the 08/01/24 encounter (Office Visit) with Lamonta Cypress B, MD  Medication   tezepelumab -ekko (TEZSPIRE ) 210 MG/1. syringe 210 mg            Past Medical History:  Diagnosis Date   Anemia    Aortic atherosclerosis (HCC)    Asthma    Atrial fibrillation and flutter (HCC)    a.) CHA2DS2VASc = 6 (sex, HTN, TIA x2, vascular disease history, T2DM);  b.) DCCV ~ 2009; c.) DCCV 08/29/2018 (200J x1 --> SR with 1st degree AVB); d.) rate/rhythm maintained on oral diltiazem  + bisoprolol ; chronically anticoagulated with apixaban    Bipolar disorder (HCC)    Carpal tunnel syndrome of right wrist    CHB (complete heart block) (HCC)    a.) s/p St. Jude dual chamber PPM placement   CHF (congestive heart failure) (HCC)    Complication of anesthesia    a.) allergic reaction to perioperative analgesics + anesthetic medications in [place] ; unsure of combination; not able to recount reactions/events --> states I ended up in the ICU for 5 days   Constipation    a.) on linaclotide    COPD (chronic obstructive pulmonary disease) (HCC)    DDD (degenerative disc disease), thoracic    GERD (gastroesophageal reflux disease)    H/O congenital atrial septal defect (ASD) repair    Hallux valgus of right foot    Hammertoe of second toe of right foot    History of 2019 novel coronavirus disease (COVID-19)    a.) 10/2020; b.) 05/2021   HLD (hyperlipidemia)    HTN (hypertension)    Hypogammaglobulinemia (HCC)    a.) on Curitru   Hypothyroid    IgA deficiency (HCC)    IgE deficiency (HCC)    Long term current use of anticoagulant    a.) apixaban    Lumbar stenosis  Migraines    Mitral stenosis 02/08/2022   a.) TTE 02/08/2022: EF 60-65%, mild MAC, mild MV stenosis (peak grad 14.6 / mean grad 4.0)   Neuropathy    NSVT (nonsustained  ventricular tachycardia) (HCC) 10/31/2018   a.) Holter 10/31/2018: 6 beat run   Obesity    OSA on CPAP    Osteoarthritis    Presence of permanent cardiac 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 Jude dual chamber device; placed for symptomatic bradycardia secondary to intermittent CHB   PTSD (post-traumatic stress disorder)    Pulmonary nodules    Recurrent sinusitis    Short-term memory loss    Sleep difficulties    a.) takes melatonin   Symptomatic bradycardia    a.) s/p St. Jude dual chamber PPM placement   Syncope    TIA (transient ischemic attack)    x3-last one in 2015   Tremors of nervous system    Type 2 diabetes mellitus (HCC)       Objective:    wts  08/01/2024     149   05/01/24 169 lb (76.7 kg)  05/01/24 171 lb 15.3 oz (78 kg)  04/10/24 172 lb 12.8 oz (78.4 kg)      Vital signs reviewed  08/01/2024  - Note at rest 02 sats  100% on RA   General appearance:    amb wf, congested sounding cough    HEENT : Oropharynx  clear        NECK :  without  apparent JVD/ palpable Nodes/TM    LUNGS: no acc muscle use,  Nl contour chest with insp/exp rhonchi bilaterally    CV:  RRR  no s3 or murmur or increase in P2, and no edema   ABD:  soft and nontender   MS:  Gait nl   ext warm without deformities Or obvious joint restrictions  calf tenderness, cyanosis or clubbing    SKIN: warm and dry without lesions    NEURO:  alert, approp, nl sensorium with  no motor or cerebellar deficits apparent.         Assessment   Assessment & Plan Asthmatic bronchitis , chronic (HCC) Quit smoking 2008 s need for inhalers at that point  - failed fosenra so started on Tespire 03/2024 by Allergy  - Spirometry 04/26/24   FEV1 1.66 (73%)  Ratio 0.67  and no curvature on exp and flat truncated insp curve   - 05/01/2024  After extensive coaching inhaler device,  effectiveness =    80% on hfa s spacer> symb 160 trial in place of advair  - 08/01/2024   improved doe > no change in rx      -  The proper method of use, as well as anticipated side effects, of a metered-dose inhaler were discussed and demonstrated to the patient using teach back method. Improved effectiveness after extensive coaching during this visit to a level of approximately80 % from a baseline of 50 % so contintinue symbicort  160 / tezpire and prn saba :  Re SABA :  I spent extra time with pt today reviewing appropriate use of albuterol  for prn use on exertion with the following points: 1) saba is for relief of sob that does not improve by walking a slower pace or resting but rather if the pt does not improve after trying this first. 2) If the pt is convinced, as many are, that saba helps recover from activity faster then it's easy to tell if this is the case by  re-challenging : ie stop, take the inhaler, then p 5 minutes try the exact same activity (intensity of workload) that just caused the symptoms and see if they are substantially diminished or not after saba 3) if there is an activity that reproducibly causes the symptoms, try the saba 15 min before the activity on alternate days   If in fact the saba really does help, then fine to continue to use it prn but advised may need to look closer at the maintenance regimen being used to achieve better control of airways disease with exertion.   F/u in this clinic can be prn      Cigarette smoker Counseled re importance of smoking cessation but did not meet time criteria for separate billing           Each maintenance medication was reviewed in detail including emphasizing most importantly the difference between maintenance and prns and under what circumstances the prns are to be triggered using an action plan format where appropriate.  Total time for H and P, chart review, counseling, reviewing hfa/ neb device(s) and generating customized AVS unique to this office visit / same day charting = 320 min summary f/u ov          AVS  Patient Instructions  Work on  inhaler technique:  relax and gently blow all the way out then take a nice smooth full deep breath back in, triggering the inhaler at same time you start breathing in.  Hold breath in for at least  5 seconds if you can. Blow out symbicort   thru nose. Rinse and gargle with water when done.  If mouth or throat bother you at all,  try brushing teeth/gums/tongue with arm and hammer toothpaste/ make a slurry and gargle and spit out.   Give yourself an albuterol  nebulizer right before you leave for your surgery  Keep appt  08/05/24 and follow up is as needed        Ozell America, MD 08/01/2024

## 2024-08-02 NOTE — Progress Notes (Signed)
 Established Patient Pulmonology Office Visit   Subjective:  Patient ID: Laura [name], female    DOB: Mar 13, 1961  MRN: [id number]  CC:  Chief Complaint  Patient presents with   Sleep Apnea    Osa w compliance report Mask leakage sometimes     HPI  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  Discussed the use of AI scribe software for clinical note transcription with the patient, who gave verbal consent to proceed.  History of Present Illness Laura [name] is a 63 year old female with sleep apnea who presents for evaluation of her CPAP therapy.  She has been using a CPAP machine since her diagnosis of sleep apnea in [place] . She uses the CPAP whenever she sleeps, averaging nine hours per night. The CPAP is an auto-adjusting model with pressures ranging from 5 to 15 cm H2O, and she typically spends time around 11 cm H2O. She experiences facial breakouts and dryness, which she attributes to the CPAP mask.  She was diagnosed with alpha-gal syndrome after a tick bite approximately six months ago while clearing land on her sister's property. Since then, she has been unable to tolerate meat, pork, or dairy, leading to significant weight loss from 221 pounds in January to her current weight. This has also affected her muscle tissue, and she continues to experience digestive issues. Further evaluation is ongoing to understand these symptoms better.  She has a cyst that has been present since 2024, which has become painful and affects her sleep. She reports sleeping on an ice pack due to the discomfort.  She has a history of asthma and is currently on Tezspire , which she prefers over Fasenra . 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 She is under the care of Dr. Iva for her asthma and allergy  management.  ROS    Current Outpatient Medications:    acetaminophen  (TYLENOL ) 500 MG tablet, Take 500 mg by mouth every 8 (eight) hours as needed., Disp: , Rfl:    albuterol  (PROVENTIL ) (2.5 MG/3ML) 0.083% nebulizer solution, Take 2.5 mg by nebulization every 4 (four) hours as needed for wheezing., Disp: , Rfl:    atorvastatin  (LIPITOR) 20 MG tablet, Take 20 mg by mouth at bedtime., Disp: , Rfl:    Azelastine  HCl 137 MCG/SPRAY SOLN, Place 1 spray into both nostrils daily., Disp: 90 mL, Rfl: 1   BD PEN NEEDLE NANO 2ND GEN 32G X 4 MM MISC, 1 each by Other route in the morning, at noon, in the evening, and at bedtime., Disp: 90 each, Rfl: 0   bisoprolol  (ZEBETA ) 10 MG tablet, Take 1 tablet (10 mg total) by mouth daily., Disp: 90 tablet, Rfl: 3   blood glucose meter kit and supplies, 1 each by Other route 4 (four) times daily. Dispense based on patient and insurance preference. Use up to four times daily as directed. (FOR ICD-10 E10.9, E11.9)., Disp: 1 each, Rfl: 0   Cholecalciferol (VITAMIN D3) 125 MCG (5000 UT) CAPS, Take 1 capsule (5,000 Units total) by mouth daily., Disp: 90 capsule, Rfl: 0   Continuous Glucose Sensor (FREESTYLE LIBRE 3 SENSOR) MISC, 1 Piece by Does not apply route every 14 (fourteen) days. Place 1 sensor on the skin every 14 days. Use to check glucose continuously, Disp: 6 each, Rfl: 1   diltiazem  (CARTIA  XT) 300 MG 24 hr capsule,  Take 1 capsule (300 mg total) by mouth daily., Disp: 100 capsule, Rfl: 2   divalproex  (DEPAKOTE  ER) 500 MG 24 hr tablet, Take 1 tablet (500 mg total) by mouth daily AND 2 tablets (1,000 mg total) at bedtime., Disp: 270 tablet, Rfl: 0   Dulaglutide  (TRULICITY ) 3 MG/0.5ML SOAJ, Inject 3 mg into the skin once a week., Disp: , Rfl:    ELDERBERRY PO, Take 4 g by mouth daily. Gummies 2g each, Disp: , Rfl:    ELIQUIS  5 MG TABS tablet, TAKE 1 TABLET BY MOUTH TWICE  DAILY, Disp: 200 tablet, Rfl: 2   EPINEPHrine  0.3 mg/0.3 mL IJ SOAJ injection, Inject 0.3 mLs  (0.3 mg total) into the muscle once for 1 dose. As needed for life-threatening allergic reactions, Disp: 2 Device, Rfl: 1   EPINEPHrine  0.3 mg/0.3 mL IJ SOAJ injection, Inject 0.3 mg into the muscle once as needed for anaphylaxis., Disp: 2 each, Rfl: 1   EPINEPHrine  0.3 mg/0.3 mL IJ SOAJ injection, Inject 0.3 mg into the muscle as needed for anaphylaxis. As needed for life-threatening allergic reactions, Disp: 2 each, Rfl: 1   FERROUS SULFATE  PO, Take 65 mg by mouth every evening., Disp: , Rfl:    fluticasone  furoate-vilanterol (BREO ELLIPTA ) 100-25 MCG/ACT AEPB, Inhale 1 puff into the lungs daily., Disp: , Rfl:    gabapentin  (NEURONTIN ) 600 MG tablet, Take 600 mg by mouth 3 (three) times daily., Disp: , Rfl:    hydrOXYzine  (ATARAX ) 25 MG tablet, TAKE ONE TABLET BY MOUTH THREE TIMES DAILY AS NEEDED FOR ANXIETY OR FOR ITCHING, Disp: 30 tablet, Rfl: 1   Immune Globulin , Human, (CUVITRU  [place]), Inject 27 mg into the skin every 14 (fourteen) days., Disp: , Rfl:    Insulin  Pen Needle (PEN NEEDLES 3/16) 31G X 5 MM MISC, Use as directed with insulin  pen, Disp: 100 each, Rfl: 0   JARDIANCE  10 MG TABS tablet, TAKE 1 TABLET BY MOUTH DAILY  BEFORE BREAKFAST, Disp: 100 tablet, Rfl: 2   levalbuterol  (XOPENEX  HFA) 45 MCG/ACT inhaler, USE 2 INHALATIONS BY MOUTH EVERY 6 HOURS AS NEEDED FOR WHEEZING, Disp: 30 g, Rfl: 6   levalbuterol  (XOPENEX ) 1.25 MG/3ML nebulizer solution, Use 1 vial in the nebulizer every 6 hours as needed for cough, wheeze, shortness of breath or chest tightness., Disp: 225 mL, Rfl: 1   levothyroxine  (SYNTHROID , LEVOTHROID) 112 MCG tablet, Take 112 mcg by mouth daily before breakfast., Disp: , Rfl:    losartan  (COZAAR ) 50 MG tablet, Take 50 mg by mouth every morning., Disp: , Rfl:    MELATONIN GUMMIES PO, Take 10 mg by mouth at bedtime., Disp: , Rfl:    metFORMIN  (GLUCOPHAGE ) 500 MG tablet, Take 500 mg by mouth 2 (two) times daily., Disp: , Rfl:    montelukast  (SINGULAIR ) 10 MG tablet, TAKE 1  TABLET BY MOUTH AT  BEDTIME, Disp: 100 tablet, Rfl: 2   Multiple Vitamins-Minerals (MULTIVITAMIN WITH MINERALS) tablet, Take 1 tablet by mouth daily. Woman, Disp: , Rfl:    mupirocin  ointment (BACTROBAN ) 2 %, Apply 1 Application topically 2 (two) times daily., Disp: 60 g, Rfl: 0   Nebulizer MISC, 1 Device by Other route as directed. Nebulizer tubing kit, Disp: 1 each, Rfl: 1   ondansetron  (ZOFRAN -ODT) 8 MG disintegrating tablet, Take 8 mg by mouth daily as needed for vomiting or nausea., Disp: , Rfl:    ONETOUCH ULTRA test strip, 1 each by Other route in the morning, at noon, in the evening, and at bedtime., Disp: ,  Rfl:    Potassium Chloride  ER 20 MEQ TBCR, Take 20 mEq by mouth daily., Disp: , Rfl:    sertraline  (ZOLOFT ) 25 MG tablet, Take 1 tablet (25 mg total) by mouth at bedtime., Disp: 90 tablet, Rfl: 0   Spacer/Aero-Holding Chambers DEVI, 1 Device by Does not apply route as directed., Disp: 1 each, Rfl: 1   Tezepelumab -ekko (TEZSPIRE ) 210 MG/1. SOAJ, Inject 210 mg into the skin every 28 (twenty-eight) days., Disp: 1.91 mL, Rfl: 11   topiramate  (TOPAMAX ) 100 MG tablet, TAKE 2  TABLETS BY MOUTH  IN THE MORNING AND 1 TABLET IN  THE EVENING, Disp: 450 tablet, Rfl: 3   triamcinolone  cream (KENALOG ) 0.1 %, Apply 1 Application topically 2 (two) times daily., Disp: , Rfl:   Current Facility-Administered Medications:    tezepelumab -ekko (TEZSPIRE ) 210 MG/1. syringe 210 mg, 210 mg, Subcutaneous, Q28 days, Iva Marty Saltness, MD, 210 mg at 04/01/24 9060      Objective:  BP (!) 165/70   Pulse 77   Ht 5' 4 (1.626 m)   Wt 151 lb 9.6 oz (68.8 kg)   SpO2 99% Comment: ra  BMI 26.02 kg/m  Wt Readings from Last 3 Encounters:  08/05/24 151 lb 9.6 oz (68.8 kg)  08/01/24 149 lb 6.4 oz (67.8 kg)  07/31/24 150 lb 6.4 oz (68.2 kg)   BMI Readings from Last 3 Encounters:  08/05/24 26.02 kg/m  08/01/24 25.64 kg/m  07/31/24 25.82 kg/m   SpO2 Readings from Last 3 Encounters:  08/05/24 99%   08/01/24 100%  07/31/24 99%    Physical Exam Physical Exam CHEST: Clear to auscultation bilaterally. No wheezes, rhonchi, or crackles. CARDIOVASCULAR: Normal heart rate and rhythm. S1 and S2 normal without murmurs. No atrial fibrillation. SKIN: Palpable cyst on Left Buttock.   Diagnostic Review:  Last CBC Lab Results  Component Value Date   WBC 4.9 07/10/2024   HGB 16.3 (H) 07/10/2024   HCT 44.8 07/10/2024   MCV 97.0 07/10/2024   MCH 35.3 (H) 07/10/2024   RDW 12.9 07/10/2024   PLT 169 07/10/2024   Last metabolic panel Lab Results  Component Value Date   GLUCOSE 133 (H) 02/18/2024   NA 137 02/18/2024   K 4.3 02/18/2024   CL 110 02/18/2024   CO2 21 (L) 02/18/2024   BUN 19 02/18/2024   CREATININE 0.66 02/18/2024   GFRNONAA 93 04/26/2024   CALCIUM  8.9 02/18/2024   PHOS 3.0 04/08/2021   PROT 7.7 07/10/2024   ALBUMIN 4.4 07/10/2024   LABGLOB 2.9 03/03/2023   AGRATIO 1.5 03/03/2023   BILITOT 0.4 07/10/2024   ALKPHOS 60 07/10/2024   AST 17 07/10/2024   ALT 13 07/10/2024   ANIONGAP 6 02/18/2024    CT chest 03/2022: relatively normal PFTs 07/31/24: borderline obstructive defect    Assessment & Plan:   Assessment & Plan Obstructive sleep apnea  Obstructive sleep apnea, mild, on CPAP therapy with mask fit issues Mild obstructive sleep apnea persists despite weight loss, affecting CPAP mask fit and causing leaks. CPAP data shows effective machine function with pressure settings 5-15 and 9 hours average usage. She is a 100% compliant. Her leak is around 22.2 L/min. Residual AHI is 8.9. - Submit order to [employer] for new CPAP mask fitting to address fit issues and reduce leaks.  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 Plasma infusions for common variable immunodeficiency aid asthma management. Coordinated care with Dr. Iva.  Orders Placed This Encounter  Procedures   AMB REFERRAL FOR DME   I  spent 30 minutes reviewing patient's chart including prior consultant notes, imaging, and PFTs as well as face-to-face with the patient, over half in discussion of the diagnosis and the importance of compliance with the treatment plan.  Return in about 6 months (around 02/03/2025).   Kyrstal Monterrosa, MD

## 2024-08-05 ENCOUNTER — Ambulatory Visit (INDEPENDENT_AMBULATORY_CARE_PROVIDER_SITE_OTHER): Admitting: Pulmonary Disease

## 2024-08-05 ENCOUNTER — Encounter: Payer: Self-pay | Admitting: Nutrition

## 2024-08-05 ENCOUNTER — Encounter: Payer: Self-pay | Admitting: Pulmonary Disease

## 2024-08-05 ENCOUNTER — Other Ambulatory Visit: Payer: Self-pay | Admitting: Cardiology

## 2024-08-05 VITALS — BP 165/70 | HR 77 | Ht 64.0 in | Wt 151.6 lb

## 2024-08-05 DIAGNOSIS — E039 Hypothyroidism, unspecified: Secondary | ICD-10-CM | POA: Diagnosis not present

## 2024-08-05 DIAGNOSIS — E1169 Type 2 diabetes mellitus with other specified complication: Secondary | ICD-10-CM | POA: Diagnosis not present

## 2024-08-05 DIAGNOSIS — G4733 Obstructive sleep apnea (adult) (pediatric): Secondary | ICD-10-CM

## 2024-08-05 DIAGNOSIS — E559 Vitamin D deficiency, unspecified: Secondary | ICD-10-CM | POA: Diagnosis not present

## 2024-08-05 DIAGNOSIS — I10 Essential (primary) hypertension: Secondary | ICD-10-CM | POA: Diagnosis not present

## 2024-08-05 NOTE — Patient Instructions (Signed)
  VISIT SUMMARY: Today, we reviewed your CPAP therapy for sleep apnea, discussed your asthma management, and addressed issues related to your CPAP mask fit.  YOUR PLAN: OBSTRUCTIVE SLEEP APNEA: You have mild obstructive sleep apnea and have been using a CPAP machine. However, you are experiencing issues with the mask fit, which is causing leaks. -We will submit an order to [employer] for a new CPAP mask fitting to address the fit issues and reduce leaks.  ASTHMA: Your asthma is being managed with Tezspire , which you prefer over Fasenra . 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 -Continue using Tezspire  for asthma management. -Continue plasma infusions for common variable immunodeficiency. -Coordinate care with Dr. Iva for ongoing asthma and allergy management.                      Contains text generated by [employer]bridge.                                 Contains text generated by Abridge.

## 2024-08-05 NOTE — Patient Instructions (Signed)
  Goals Established by Pt Choose 1 thing to eat for breakfast and lunch daily. Eat dinner daily Eat 3 pieces of fruit per day and 2 vegetables.

## 2024-08-06 DIAGNOSIS — D839 Common variable immunodeficiency, unspecified: Secondary | ICD-10-CM | POA: Diagnosis not present

## 2024-08-06 LAB — LAB REPORT - SCANNED
A1c: 6.1
Albumin, Urine POC: 13.4
Albumin/Creatinine Ratio, Urine, POC: 39
Creatinine, POC: 34.7 mg/dL
EGFR: 101
Free T4: 1.53 ng/dL
TSH: 1.08

## 2024-08-08 DIAGNOSIS — L720 Epidermal cyst: Secondary | ICD-10-CM | POA: Diagnosis not present

## 2024-08-08 DIAGNOSIS — L568 Other specified acute skin changes due to ultraviolet radiation: Secondary | ICD-10-CM | POA: Diagnosis not present

## 2024-08-12 ENCOUNTER — Ambulatory Visit (HOSPITAL_COMMUNITY)
Admission: RE | Admit: 2024-08-12 | Discharge: 2024-08-12 | Disposition: A | Discharge status: Home / Self Care | Attending: General Surgery | Admitting: General Surgery

## 2024-08-12 ENCOUNTER — Encounter (HOSPITAL_COMMUNITY): Admission: RE | Disposition: A | Payer: Self-pay | Source: Home / Self Care | Attending: General Surgery

## 2024-08-12 DIAGNOSIS — R2242 Localized swelling, mass and lump, left lower limb: Secondary | ICD-10-CM | POA: Diagnosis present

## 2024-08-12 DIAGNOSIS — E1140 Type 2 diabetes mellitus with diabetic neuropathy, unspecified: Secondary | ICD-10-CM | POA: Diagnosis not present

## 2024-08-12 DIAGNOSIS — Z950 Presence of cardiac pacemaker: Secondary | ICD-10-CM | POA: Diagnosis not present

## 2024-08-12 DIAGNOSIS — Z7901 Long term (current) use of anticoagulants: Secondary | ICD-10-CM | POA: Diagnosis not present

## 2024-08-12 DIAGNOSIS — R222 Localized swelling, mass and lump, trunk: Secondary | ICD-10-CM | POA: Diagnosis present

## 2024-08-12 DIAGNOSIS — D1739 Benign lipomatous neoplasm of skin and subcutaneous tissue of other sites: Secondary | ICD-10-CM

## 2024-08-12 DIAGNOSIS — I509 Heart failure, unspecified: Secondary | ICD-10-CM | POA: Diagnosis not present

## 2024-08-12 DIAGNOSIS — Z794 Long term (current) use of insulin: Secondary | ICD-10-CM | POA: Insufficient documentation

## 2024-08-12 DIAGNOSIS — I4891 Unspecified atrial fibrillation: Secondary | ICD-10-CM | POA: Insufficient documentation

## 2024-08-12 DIAGNOSIS — I110 Hypertensive heart disease with heart failure: Secondary | ICD-10-CM | POA: Diagnosis not present

## 2024-08-12 DIAGNOSIS — Z7984 Long term (current) use of oral hypoglycemic drugs: Secondary | ICD-10-CM | POA: Diagnosis not present

## 2024-08-12 HISTORY — PX: EXCISION MASS LOWER EXTREMETIES: SHX6705

## 2024-08-12 MED ORDER — OXYCODONE HCL 5 MG PO TABS: 5.0000 mg | ORAL_TABLET | ORAL | 0 refills | Status: DC | PRN

## 2024-08-12 MED ORDER — LIDOCAINE-SODIUM BICARBONATE 1-8.4 % IJ SOSY
PREFILLED_SYRINGE | INTRAMUSCULAR | Status: DC | PRN
  Administered 2024-08-12: 11 mL via SUBCUTANEOUS
  Administered 2024-08-12: 3 mL via SUBCUTANEOUS

## 2024-08-12 MED ORDER — SILVER NITRATE-POT NITRATE 75-25 % EX MISC
1.0000 | Freq: Once | CUTANEOUS | Status: DC
  Filled 2024-08-12: qty 10

## 2024-08-12 SURGICAL SUPPLY — 1 items: NDL HYPO 25X1 1.5 SAFETY (NEEDLE) ×1 IMPLANT

## 2024-08-12 NOTE — Interval H&P Note (Signed)
 History and Physical Interval Note:  08/12/2024 8:15 AM  Laura [name]  has presented today for surgery, with the diagnosis of MASS OF LEFT HIP, 1.5 CM.  The various methods of treatment have been discussed with the patient and family. After consideration of risks, benefits and other options for treatment, the patient has consented to  Procedure(s): EXCISION MASS LOWER EXTREMITIES (Left) as a surgical intervention.  The patient's history has been reviewed, patient examined, no change in status, stable for surgery.  I have reviewed the patient's chart and labs.  Questions were answered to the patient's satisfaction.    No changes. Still with pain, hopefully this will improve, discussed it may not. Discussed her eliquis  and platelet at 120. 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 This is superficial so should not be an issue.   [name] JAYSON Pander

## 2024-08-12 NOTE — Discharge Instructions (Addendum)
 Discharge Instructions:  Common Complaints: Pain at the incision site is common. Some bleeding or drainage is possible the drain could be a dark silver/ brown due to the silver nitrate sticks I used to stop bleeding. The area may bruise.  Keep the bandage in place for 48 hrs if you can. You can remove on 08/14/2024. If you need to replace the bandage before then because it is saturated that is fine. [employer] until 08/14/24. You can shower normally after that but do not submerge in water. Pat the area dry. After 08/14/24 you can replace the gauze and tape daily and put a small amount of neosporin on the area daily. This will help keep the area from getting irritated with sitting.   If you have bleeding, hold firm pressure on the area for 10 minutes uninterrupted. The pressure should be hard enough to turn your knuckles white.   Diet/ Activity: Limit excessive movement, lifting > 10 lbs, stretching with the limb if there is an incision on your arm/armpit or leg.   Limit stretching, pulling on your incision if it is located on other parts of your body.  Do not pick the stitches.   Medication: Take tylenol  as needed for pain control Tylenol  1000mg  Take Roxicodone  for breakthrough pain every 4 hours.  Take Colace for constipation related to narcotic pain medication.  If you do not have a bowel movement in 2 days, take Miralax  over the counter.  Drink plenty of water to also prevent constipation.   Contact Information: If you have questions or concerns, please call our office, [phone], Monday- Thursday 8AM-5PM and Friday 8AM-12Noon.  If it is after hours or on the weekend, please call [facility], [phone], [phone] and ask to speak to the surgeon on call for Dr. Kallie at [facility].

## 2024-08-12 NOTE — Progress Notes (Signed)
 [facility]  Rx sent to pharmacy.  Will get sutures out 11/13. Dressing supplies given.    Future Appointments  Date Time Provider Department Center  08/22/2024 10:15 AM Kallie [name] BROCKS, MD RS-RS None  08/28/2024  9:00 AM Franchot Larve T, LCSW BH-BHRA None  08/29/2024 10:10 AM Cindie Carlin POUR, DO RGA-RGA RGA  08/30/2024  7:00 AM CVD HVT DEVICE REMOTES CVD-MAGST H&V  09/18/2024  8:20 AM Vickey Mettle, MD ARPA-ARPA None  11/26/2024  9:00 AM Lenis Ethelle ORN, MD REA-REA None  11/26/2024  9:30 AM Kristine Ronal BIRCH, RD NDM-NDMR None  11/29/2024  7:00 AM CVD HVT DEVICE REMOTES CVD-MAGST H&V  01/28/2025 11:30 AM Dina Camie BRAVO, PA-C LBN-LBNG None  01/29/2025 10:15 AM Iva Marty Saltness, MD AAC-REIDSVIL None   [name] Kallie, MD [facility] [address] [place], [place] [address] [phone] (office)

## 2024-08-12 NOTE — Op Note (Signed)
 [name]  08/12/24  Pre-procedure Diagnosis: Left buttock mass, calcified on CT scans    Post-procedure Diagnosis: Same   Procedure(s) Performed: Excision of left buttock mass 1.5cm    Surgeon: [name] BROCKS. Kallie, MD   Assistants: No qualified resident was available    Anesthesia: Lidocaine  1%    Specimens: Left buttock calcified mass    Estimated Blood Loss: Minimal  Wound Class: Clean    Procedure Indications: [name] is a 63 yo with a mass on the left buttock she can feel and is causing discomfort. She is on eliquis  and has lower platelets at baseline. She reported a recent platelet of 120 which is sufficient for this procedure. She has a superficial mass that I can see on imaging and is calcified. We discussed excision with local and bleeding control with silver nitrate given her pacemaker. Discussed risk of bleeding, infection, finding something unexpected, and getting sutures out post operatively.   Findings: Hard mass 1.5cm left buttock    Procedure: The patient was taken to the procedure room and placed on her right side down. The left buttock was prepared and draped in the usual sterile fashion. Lidocaine  1% was injected over the hard palpable mass. The incision was made over the area and carried down to the subcutaneous tissue. I excised the hard mass with sharp excision with scissors. I removed the entire mass and some normal adipose around the mass. The mass measured at least 1.5cm in size. The cavity was made hemostatic and 3-0 Vicryl interrupted sutures were placed into the cavity to close the space. Silver nitrate was used to help with hemostasis. The skin was closed with 3-0 Nylon vertical mattress. Gauze and papertape were placed over the wound.   Final inspection revealed acceptable hemostasis. The patient tolerated the procedure well.    [name] Kallie, MD [facility] [address] [place], [place] [address] [phone] (office)

## 2024-08-12 NOTE — H&P (Signed)
 [facility] History and Physical   Reason for Referral: Mass on left buttock  Referring Physician: Shona Norleen PEDLAR, MD     Chief Complaint   New Patient (Initial Visit)        Laura [name] is a 63 y.o. female.  HPI:      The patient comes in with complaints of a mass on her left buttock that has been there for years. She feels like it has been bothering her and causes her pain when sleeping. She recently had a cyst removed from her back with dermatology and did well. She has a history of A fib, pacemaker and is on Eliquis . She also takes Jardiance  and Trulicity . She has lost weight recently.        Past Medical History:  Diagnosis Date   Anemia     Aortic atherosclerosis     Asthma     Atrial fibrillation and flutter (HCC)      a.) CHA2DS2VASc = 6 (sex, HTN, TIA x2, vascular disease history, T2DM);  b.) DCCV ~ 2009; c.) DCCV 08/29/2018 (200J x1 --> SR with 1st degree AVB); d.) rate/rhythm maintained on oral diltiazem  + bisoprolol ; chronically anticoagulated with apixaban    Bipolar disorder (HCC)     Carpal tunnel syndrome of right wrist     CHB (complete heart block) (HCC)      a.) s/p St. Jude dual chamber PPM placement   CHF (congestive heart failure) (HCC)     Complication of anesthesia      a.) allergic reaction to perioperative analgesics + anesthetic medications in [place] ; unsure of combination; not able to recount reactions/events --> states I ended up in the ICU for 5 days   Constipation      a.) on linaclotide    COPD (chronic obstructive pulmonary disease) (HCC)     DDD (degenerative disc disease), thoracic     GERD (gastroesophageal reflux disease)     H/O congenital atrial septal defect (ASD) repair     Hallux valgus of right foot     Hammertoe of second toe of right foot     History of 2019 novel coronavirus disease (COVID-19)      a.) 10/2020; b.) 05/2021   HLD (hyperlipidemia)     HTN (hypertension)     Hypogammaglobulinemia      a.)  on Curitru   Hypothyroid     IgA deficiency (HCC)     IgE deficiency     Long term current use of anticoagulant      a.) apixaban    Lumbar stenosis     Migraines     Mitral stenosis 02/08/2022    a.) TTE 02/08/2022: EF 60-65%, mild MAC, mild MV stenosis (peak grad 14.6 / mean grad 4.0)   Neuropathy     NSVT (nonsustained ventricular tachycardia) (HCC) 10/31/2018    a.) Holter 10/31/2018: 6 beat run   Obesity     OSA on CPAP     Osteoarthritis     Presence of permanent cardiac pacemaker 03/03/2022    a.) St. Jude dual chamber device; placed for symptomatic bradycardia secondary to intermittent CHB   PTSD (post-traumatic stress disorder)     Pulmonary nodules     Recurrent sinusitis     Short-term memory loss     Sleep difficulties      a.) takes melatonin   Symptomatic bradycardia      a.) s/p St. Jude dual chamber PPM placement   Syncope     TIA (  transient ischemic attack)      x3-last one in 2015   Tremors of nervous system     Type 2 diabetes mellitus (HCC)                 Past Surgical History:  Procedure Laterality Date   ASD REPAIR   1968   BALLOON DILATION N/A 11/27/2023    Procedure: BALLOON DILATION;  Surgeon: Cindie Carlin POUR, DO;  [facility]: [facility];  Service: Endoscopy;  Laterality: N/A;  9:00 am, asa 3   BIOPSY   01/26/2021    Procedure: BIOPSY;  Surgeon: Cindie Carlin POUR, DO;  [facility]: [facility];  Service: Endoscopy;;  gastric   BIOPSY   11/27/2023    Procedure: BIOPSY;  Surgeon: Cindie Carlin POUR, DO;  [facility];  Service: Endoscopy;;   CARDIOVERSION   2009   CARDIOVERSION N/A 08/29/2018    Procedure: CARDIOVERSION;  Surgeon: Alvan Dorn FALCON, MD;  [facility]: [facility];  Service: Endoscopy;  Laterality: N/A;   CARPAL TUNNEL RELEASE Bilateral     CESAREAN SECTION   1986, 1988, 1994   COLONOSCOPY N/A 02/15/2024    Procedure: COLONOSCOPY;  Surgeon: Cindie Carlin POUR, DO;  [facility];  Service: Endoscopy;   Laterality: N/A;  8:30 am, asa 3   COLONOSCOPY WITH PROPOFOL  N/A 01/26/2021    Procedure: COLONOSCOPY WITH PROPOFOL ;  Surgeon: Cindie Carlin POUR, DO;  [facility];  Service: Endoscopy;  Laterality: N/A;  am appt, diabetic   ESOPHAGOGASTRODUODENOSCOPY (EGD) WITH PROPOFOL  N/A 01/26/2021    Procedure: ESOPHAGOGASTRODUODENOSCOPY (EGD) WITH PROPOFOL ;  Surgeon: Cindie Carlin POUR, DO;  [facility];  Service: Endoscopy;  Laterality: N/A;   ESOPHAGOGASTRODUODENOSCOPY (EGD) WITH PROPOFOL  N/A 11/27/2023    Procedure: ESOPHAGOGASTRODUODENOSCOPY (EGD) WITH PROPOFOL ;  Surgeon: Cindie Carlin POUR, DO;  [facility];  Service: Endoscopy;  Laterality: N/A;  9:00 am, asa 3   HALLUX VALGUS [place]IN Right 07/29/2022    Procedure: HALLUX VALGUS;  Surgeon: Silva Juliene SAUNDERS, DPM;  Location: [facility];  Service: Podiatry;  Laterality: Right;   HAMMER TOE SURGERY Right 07/29/2022    Procedure: HAMMER TOE CORRECTION 2ND;  Surgeon: Silva Juliene SAUNDERS, DPM;  Location: [facility];  Service: Podiatry;  Laterality: Right;   LEFT HEART CATH AND CORONARY ANGIOGRAPHY N/A 08/30/2019    Procedure: LEFT HEART CATH AND CORONARY ANGIOGRAPHY;  Surgeon: Dann Candyce RAMAN, MD;  Location: [facility] IN[facility]ASIVE CV [facility];  Service: Cardiovascular;  Laterality: N/A;   NASAL SINUS SURGERY   2017   PACEMAKER IMPLANT N/A 03/03/2022    Procedure: PACEMAKER IMPLANT;  Surgeon: Waddell Danelle ORN, MD;  Location: [facility];  Service: Cardiovascular;  Laterality: N/A;   POLYPECTOMY   01/26/2021    Procedure: POLYPECTOMY;  Surgeon: Cindie Carlin POUR, DO;  [facility];  Service: Endoscopy;;  colon   TOOTH EXTRACTION N/A 07/04/2023    Procedure: DENTAL RESTORATION/EXTRACTIONS;  Surgeon: Sheryle Hamilton, DMD;  Location: [facility];  Service: Oral Surgery;  Laterality: N/A;               Family History  Problem Relation Age of Onset   Schizophrenia Mother     Hypertension Mother     Stroke Mother     Kidney disease  Mother     Heart attack Mother     Alcohol abuse Mother     Colon cancer Father          colon  cancer   Kidney disease Sister     Hypertension Sister     Bipolar disorder Sister     Atrial fibrillation Sister     Schizophrenia Sister     Heart disease Brother     Prostate cancer Brother     Stroke Maternal Aunt     Heart disease Maternal Aunt     Thyroid  disease Daughter     Hashimoto's thyroiditis Daughter     Celiac disease Daughter     Allergic rhinitis Daughter     Asthma Neg Hx            Social History  Social History         Tobacco Use   Smoking status: Former      Current packs/day: 0.00      Average packs/day: 1 pack/day for 28.0 years (28.0 ttl pk-yrs)      Types: Cigarettes      Start date: 30      Quit date: 2008      Years since quitting: 17.8   Smokeless tobacco: Never  Vaping Use   Vaping status: Never Used  Substance Use Topics   Alcohol use: Not Currently   Drug use: Not Currently        Medications: I have reviewed the patient's current medications. Allergies as of 07/30/2024         Reactions    Alpha-gal      Ativan [lorazepam] Hives    Phenergan [promethazine Hcl] Hives            Medication List           Accurate as of July 30, 2024  9:09 AM. If you have any questions, ask your nurse or doctor.              acetaminophen  500 MG tablet Commonly known as: TYLENOL  Take 500 mg by mouth every 8 (eight) hours as needed.    albuterol  (2.5 MG/3ML) 0.083% nebulizer solution Commonly known as: PROVENTIL  Take 2.5 mg by nebulization every 4 (four) hours as needed for wheezing.    atorvastatin  20 MG tablet Commonly known as: LIPITOR Take 20 mg by mouth at bedtime.    Azelastine  HCl 137 MCG/SPRAY Soln Place 1 spray into both nostrils daily.    bisoprolol  10 MG tablet Commonly known as: ZEBETA  Take 1 tablet (10 mg total) by mouth daily.    blood glucose meter kit and supplies 1 each by Other route 4 (four) times daily.  Dispense based on patient and insurance preference. Use up to four times daily as directed. (FOR ICD-10 E10.9, E11.9).    Breo Ellipta  100-25 MCG/ACT Aepb Generic drug: fluticasone  furoate-vilanterol Inhale 1 puff into the lungs daily.    CUVITRU  [place] Inject 27 mg into the skin every 14 (fourteen) days.    diltiazem  300 MG 24 hr capsule Commonly known as: Cartia  XT Take 1 capsule (300 mg total) by mouth daily.    divalproex  500 MG 24 hr tablet Commonly known as: DEPAKOTE  ER Take 1 tablet (500 mg total) by mouth daily AND 2 tablets (1,000 mg total) at bedtime.    ELDERBERRY PO Take 4 g by mouth daily. Gummies 2g each    Eliquis  5 MG Tabs tablet Generic drug: apixaban  TAKE 1 TABLET BY MOUTH TWICE  DAILY    EPINEPHrine  0.3 mg/0.3 mL Soaj injection Commonly known as: EPI-PEN Inject 0.3 mg into the muscle once as needed for anaphylaxis.    FERROUS SULFATE   PO Take 65 mg by mouth every evening.    FreeStyle Libre 3 Sensor Misc 1 Piece by Does not apply route every 14 (fourteen) days. Place 1 sensor on the skin every 14 days. Use to check glucose continuously    gabapentin  600 MG tablet Commonly known as: NEURONTIN  Take 600 mg by mouth 3 (three) times daily.    hydrOXYzine  25 MG tablet Commonly known as: ATARAX  TAKE ONE TABLET BY MOUTH THREE TIMES DAILY AS NEEDED FOR ANXIETY OR FOR ITCHING    Jardiance  10 MG Tabs tablet Generic drug: empagliflozin  TAKE 1 TABLET BY MOUTH DAILY  BEFORE BREAKFAST    levalbuterol  1.25 MG/3ML nebulizer solution Commonly known as: XOPENEX  Use 1 vial in the nebulizer every 6 hours as needed for cough, wheeze, shortness of breath or chest tightness.    levalbuterol  45 MCG/ACT inhaler Commonly known as: XOPENEX  HFA USE 2 INHALATIONS BY MOUTH EVERY 6 HOURS AS NEEDED FOR WHEEZING    levothyroxine  112 MCG tablet Commonly known as: SYNTHROID  Take 112 mcg by mouth daily before breakfast.    losartan  50 MG tablet Commonly known as: COZAAR  Take 50  mg by mouth every morning.    MELATONIN GUMMIES PO Take 10 mg by mouth at bedtime.    metFORMIN  500 MG tablet Commonly known as: GLUCOPHAGE  Take 500 mg by mouth 2 (two) times daily.    montelukast  10 MG tablet Commonly known as: SINGULAIR  TAKE 1 TABLET BY MOUTH AT  BEDTIME    multivitamin with minerals tablet Take 1 tablet by mouth daily. Woman    mupirocin  ointment 2 % Commonly known as: BACTROBAN  Apply 1 Application topically 2 (two) times daily.    Nebulizer Misc 1 Device by Other route as directed. Nebulizer tubing kit    ondansetron  8 MG disintegrating tablet Commonly known as: ZOFRAN -ODT Take 8 mg by mouth daily as needed for vomiting or nausea.    OneTouch Ultra test strip Generic drug: glucose blood 1 each by Other route in the morning, at noon, in the evening, and at bedtime.    Pen Needles 3/16 31G X 5 MM Misc Use as directed with insulin  pen    BD Pen Needle Nano 2nd Gen 32G X 4 MM Misc Generic drug: Insulin  Pen Needle 1 each by Other route in the morning, at noon, in the evening, and at bedtime.    Potassium Chloride  ER 20 MEQ Tbcr Take 20 mEq by mouth daily.    sertraline  25 MG tablet Commonly known as: ZOLOFT  Take 1 tablet (25 mg total) by mouth at bedtime.    Spacer/Aero-[employer]olding [employer] 1 Device by Does not apply route as directed.    Tezspire  210 MG/1. Soaj Generic drug: Tezepelumab -ekko Inject 210 mg into the skin every 28 (twenty-eight) days.    topiramate  100 MG tablet Commonly known as: TOPAMAX  TAKE 2  TABLETS BY MOUTH  IN THE MORNING AND 1 TABLET IN  THE EVENING    triamcinolone  cream 0.1 % Commonly known as: KENALOG  Apply 1 Application topically 2 (two) times daily.    Trulicity  3 MG/0.5ML Soaj Generic drug: Dulaglutide  Inject 3 mg into the skin once a week. What changed: Another medication with the same name was removed. Continue taking this medication, and follow the directions you see here. Changed by: [name] JAYSON Pander    Vitamin D3 125 MCG (5000 UT) Caps Take 1 capsule (5,000 Units total) by mouth daily.  ROS:  A comprehensive review of systems was negative except for: Constitutional: positive for headache Respiratory: positive for cough and wheezing Cardiovascular: positive for HTN Gastrointestinal: positive for abdominal pain and nausea Genitourinary: positive for frequency Musculoskeletal: positive for back pain, bone pain, neck pain, and joint pain Endocrine: positive for cold intolerance, diabetes   Blood pressure (!) 144/64, pulse 73, temperature 97.8 F (36.6 C), temperature source Oral, resp. rate 18, height 5' 4 (1.626 m), weight 150 lb (68 kg), SpO2 94%. Physical Exam Vitals reviewed.  HENT:     Head: Normocephalic.     Mouth/Throat:     Mouth: Mucous membranes are moist.  Eyes:     Extraocular Movements: Extraocular movements intact.  Cardiovascular:     Rate and Rhythm: Normal rate.  Pulmonary:     Effort: Pulmonary effort is normal.  Abdominal:     General: There is no distension.     Palpations: Abdomen is soft.     Tenderness: There is no abdominal tenderness.  Musculoskeletal:        General: Normal range of motion.     Comments: Left buttock with superficial, hard mass about 1.5cm in size  Skin:    General: Skin is warm.  Neurological:     General: No focal deficit present.     Mental Status: She is alert and oriented to person, place, and time.  Psychiatric:        Mood and Affect: Mood normal.        Behavior: Behavior normal.       Results: CT a/p reviewed from 2022 and 2024- 1-1.5cm calcified area in the region of concern    Assessment and Plan:   Laura [name] is a 63 y.o. female with what is likely a calcified hematoma or cyst that has been there for a few years. She is having more pain likely as she is noticing it from losing weight. She wants the area removed. She is on blood thinners and has significant history. Discussed  removal in the office and offered a time in December with local. She wants to do this sooner due to her pain and opted for the procedure room. Discussed risk of bleeding, infection, finding something unexpected. She can stay on her Eliquis , Jardiance  and Trulicity  as this is just under local and is small/ superficial.      All questions were answered to the satisfaction of the patient.   Given the pacemaker will try to avoid cautery as I do not plan to get a magnet placed on the pacemaker. She reports the dermatologist using cautery on her cyst excision.        [name] JAYSON Pander 07/30/2024, 9:09 AM

## 2024-08-13 ENCOUNTER — Encounter (HOSPITAL_COMMUNITY): Payer: Self-pay | Admitting: General Surgery

## 2024-08-15 LAB — SURGICAL PATHOLOGY

## 2024-08-22 ENCOUNTER — Encounter: Payer: Self-pay | Admitting: General Surgery

## 2024-08-22 ENCOUNTER — Ambulatory Visit (INDEPENDENT_AMBULATORY_CARE_PROVIDER_SITE_OTHER): Admitting: General Surgery

## 2024-08-22 VITALS — BP 123/73 | HR 64 | Temp 98.0°F | Resp 14 | Ht 64.0 in | Wt 151.0 lb

## 2024-08-22 DIAGNOSIS — R2242 Localized swelling, mass and lump, left lower limb: Secondary | ICD-10-CM

## 2024-08-22 DIAGNOSIS — T8130XA Disruption of wound, unspecified, initial encounter: Secondary | ICD-10-CM

## 2024-08-22 DIAGNOSIS — T148XXA Other injury of unspecified body region, initial encounter: Secondary | ICD-10-CM

## 2024-08-22 MED ORDER — SULFAMETHOXAZOLE-TRIMETHOPRIM 800-160 MG PO TABS: 1.0000 | ORAL_TABLET | Freq: Two times a day (BID) | ORAL | 0 refills | Status: AC

## 2024-08-22 NOTE — Progress Notes (Unsigned)
 [facility]  Pathology: A. LEFT BUTTOCKS, MASS, EXCISION:  Consistent with a benign lipoma showing focal fibrosis, calcification  and ossification compatible with remote fat necrosis  Negative for inflammation and atypia   Future Appointments  Date Time Provider Department Center  08/28/2024  9:00 AM Franchot Larve T, LCSW [facility] None  08/29/2024 10:10 AM Cindie Carlin POUR, DO [facility]  08/30/2024  7:00 AM [facility] HVT DEVICE REMOTES [facility] H&V  09/03/2024 10:30 AM Kallie [name] BROCKS, MD [facility] None  09/18/2024  8:20 AM Vickey Mettle, MD [facility] None  11/26/2024  9:00 AM Lenis Ethelle ORN, MD [facility] None  11/26/2024  9:30 AM Kristine Ronal BIRCH, RD NDM-NDMR None  11/27/2024  9:20 AM Alvan Dorn FALCON, MD [facility] [place] [facility]  11/29/2024  7:00 AM CVD HVT DEVICE REMOTES [facility] H&V  01/28/2025 11:30 AM Dina Camie BRAVO, PA-C [facility] None  01/29/2025 10:15 AM Iva Marty Saltness, MD [facility] None

## 2024-08-22 NOTE — Patient Instructions (Signed)
 Shower per your regular routine. Keep area covered and apply neosporin to the incision area. The area is open and will continue to drain out the old blood (hematoma).  Take the antibiotic as prescribed to try to prevent any infection from forming.

## 2024-08-25 ENCOUNTER — Other Ambulatory Visit: Payer: Self-pay | Admitting: Psychiatry

## 2024-08-28 ENCOUNTER — Emergency Department (HOSPITAL_COMMUNITY)
Admission: EM | Admit: 2024-08-28 | Discharge: 2024-08-28 | Disposition: A | Discharge status: Home / Self Care | Attending: Emergency Medicine | Admitting: Emergency Medicine

## 2024-08-28 ENCOUNTER — Other Ambulatory Visit: Payer: Self-pay

## 2024-08-28 ENCOUNTER — Telehealth: Payer: Self-pay | Admitting: *Deleted

## 2024-08-28 ENCOUNTER — Encounter (HOSPITAL_COMMUNITY): Payer: Self-pay | Admitting: *Deleted

## 2024-08-28 ENCOUNTER — Ambulatory Visit (INDEPENDENT_AMBULATORY_CARE_PROVIDER_SITE_OTHER): Admitting: Clinical

## 2024-08-28 DIAGNOSIS — Z7984 Long term (current) use of oral hypoglycemic drugs: Secondary | ICD-10-CM | POA: Insufficient documentation

## 2024-08-28 DIAGNOSIS — Z7901 Long term (current) use of anticoagulants: Secondary | ICD-10-CM | POA: Diagnosis not present

## 2024-08-28 DIAGNOSIS — Z794 Long term (current) use of insulin: Secondary | ICD-10-CM | POA: Diagnosis not present

## 2024-08-28 DIAGNOSIS — T8131XA Disruption of external operation (surgical) wound, not elsewhere classified, initial encounter: Secondary | ICD-10-CM | POA: Diagnosis not present

## 2024-08-28 DIAGNOSIS — Z79899 Other long term (current) drug therapy: Secondary | ICD-10-CM | POA: Diagnosis not present

## 2024-08-28 DIAGNOSIS — F4310 Post-traumatic stress disorder, unspecified: Secondary | ICD-10-CM | POA: Diagnosis not present

## 2024-08-28 DIAGNOSIS — G8918 Other acute postprocedural pain: Secondary | ICD-10-CM | POA: Insufficient documentation

## 2024-08-28 DIAGNOSIS — E119 Type 2 diabetes mellitus without complications: Secondary | ICD-10-CM | POA: Diagnosis not present

## 2024-08-28 LAB — CBG MONITORING, ED: Glucose-Capillary: 108 mg/dL — ABNORMAL HIGH (ref 70–99)

## 2024-08-28 MED ORDER — BACITRACIN ZINC 500 UNIT/GM EX OINT
TOPICAL_OINTMENT | CUTANEOUS | Status: AC
  Administered 2024-08-28: 1
  Filled 2024-08-28: qty 0.9

## 2024-08-28 NOTE — ED Provider Notes (Signed)
 [place] EMERGENCY DEPARTMENT AT [facility] Provider Note   CSN: [id number] Arrival date & time: 08/28/24  1046     Patient presents with: Wound Check   Laura [name] is a 63 y.o. female presenting with complaint of contact continued pain and bloody drainage from surgical site left buttock.  She is under the care of Dr. Kallie for a lipoma excision 08/13/2024.  She had a mild dehiscence of the wound edges when the sutures were removed and had developed a hematoma at the site which had been manually removed at the time of her postop office visit.  She continues to have continued pain at the site, she completed a course of Bactrim yesterday but is concerned about persistent infection.  She is scheduled to see Dr. Kallie tomorrow for an office visit but is diabetic and was concerned about waiting for this appointment.  She denies fevers or chills, no other complaints except for significant pain at the site, she is unable to sit or lie on her back comfortably.   The history is provided by the patient.       Prior to Admission medications   Medication Sig Start Date End Date Taking? Authorizing Provider  acetaminophen  (TYLENOL ) 500 MG tablet Take 500 mg by mouth every 8 (eight) hours as needed.    [provider]  albuterol  (PROVENTIL ) (2.5 MG/3ML) 0.083% nebulizer solution Take 2.5 mg by nebulization every 4 (four) hours as needed for wheezing. 11/22/17   [provider]  atorvastatin  (LIPITOR) 20 MG tablet Take 20 mg by mouth at bedtime. 12/26/22   [provider]  Azelastine  HCl 137 MCG/SPRAY SOLN Place 1 spray into both nostrils daily. Patient not taking: Reported on 08/22/2024 10/25/23   Iva Marty Saltness, MD  BD PEN NEEDLE NANO 2ND GEN 32G X 4 MM MISC 1 each by Other route in the morning, at noon, in the evening, and at bedtime. 05/16/22   Lenis Ethelle ORN, MD  bisoprolol  (ZEBETA ) 10 MG tablet TAKE 1 TABLET BY MOUTH DAILY Patient taking  differently: Take 10 mg by mouth in the morning. 08/06/24   Alvan Dorn FALCON, MD  blood glucose meter kit and supplies 1 each by Other route 4 (four) times daily. Dispense based on patient and insurance preference. Use up to four times daily as directed. (FOR ICD-10 E10.9, E11.9). 01/27/21   Nida, Gebreselassie W, MD  budesonide -formoterol  (SYMBICORT ) 160-4.5 MCG/ACT inhaler Inhale 2 puffs into the lungs 2 (two) times daily.    [provider]  Cholecalciferol (VITAMIN D3) 125 MCG (5000 UT) CAPS Take 1 capsule (5,000 Units total) by mouth daily. 11/25/20   Nida, Gebreselassie W, MD  Continuous Glucose Sensor (FREESTYLE LIBRE 3 SENSOR) MISC 1 Piece by Does not apply route every 14 (fourteen) days. Place 1 sensor on the skin every 14 days. Use to check glucose continuously 04/07/23   Nida, Gebreselassie W, MD  diltiazem  (CARTIA  XT) 300 MG 24 hr capsule Take 1 capsule (300 mg total) by mouth daily. Patient taking differently: Take 300 mg by mouth in the morning. 04/26/24   Alvan Dorn FALCON, MD  divalproex  (DEPAKOTE  ER) 500 MG 24 hr tablet Take 1 tablet (500 mg total) by mouth daily AND 2 tablets (1,000 mg total) at bedtime. Patient taking differently: Take 1.5 tablet (750 mg total) by mouth daily AND 2 tablets (1,000 mg total) at bedtime. 07/01/24 09/29/24  Vickey Mettle, MD  Dulaglutide  (TRULICITY ) 3 MG/0.5ML SOAJ Inject 3 mg into the skin once  a week.    [provider]  ELDERBERRY PO Take 4 g by mouth daily. Gummies 2g each    [provider]  ELIQUIS  5 MG TABS tablet TAKE 1 TABLET BY MOUTH TWICE  DAILY 07/03/24   Alvan Dorn FALCON, MD  EPINEPHrine  0.3 mg/0.3 mL IJ SOAJ injection Inject 0.3 mg into the muscle once as needed for anaphylaxis. 07/31/24   Cari Arlean HERO, FNP  EPINEPHrine  0.3 mg/0.3 mL IJ SOAJ injection Inject 0.3 mg into the muscle as needed for anaphylaxis. As needed for life-threatening allergic reactions 07/31/24   Ambs, Arlean HERO, FNP  FERROUS SULFATE  PO Take 65 mg  by mouth every evening.    [provider]  gabapentin  (NEURONTIN ) 600 MG tablet Take 600 mg by mouth 3 (three) times daily. 07/20/21   [provider]  hydrOXYzine  (ATARAX ) 25 MG tablet TAKE ONE TABLET BY MOUTH THREE TIMES DAILY AS NEEDED FOR ANXIETY OR FOR ITCHING Patient not taking: Reported on 08/22/2024 03/10/23   Iva Marty Saltness, MD  Immune Globulin , Human, (CUVITRU  [place]) Inject 27 mg into the skin every 14 (fourteen) days. 12/24/21   [provider]  Insulin  Pen Needle (PEN NEEDLES 3/16) 31G X 5 MM MISC Use as directed with insulin  pen 10/21/21   Nida, Gebreselassie W, MD  JARDIANCE  10 MG TABS tablet TAKE 1 TABLET BY MOUTH DAILY  BEFORE BREAKFAST 03/27/24   Nida, Gebreselassie W, MD  lansoprazole  (PREVACID ) 30 MG capsule Take 30 mg by mouth 2 (two) times daily before a meal.    [provider]  levalbuterol  (XOPENEX  HFA) 45 MCG/ACT inhaler USE 2 INHALATIONS BY MOUTH EVERY 6 HOURS AS NEEDED FOR WHEEZING 11/14/23   Iva Marty Saltness, MD  levalbuterol  (XOPENEX ) 1.25 MG/3ML nebulizer solution Use 1 vial in the nebulizer every 6 hours as needed for cough, wheeze, shortness of breath or chest tightness. 11/03/23   Iva Marty Saltness, MD  levothyroxine  (SYNTHROID , LEVOTHROID) 112 MCG tablet Take 112 mcg by mouth daily before breakfast.    [provider]  linaclotide  (LINZESS ) 145 MCG CAPS capsule Take 145 mcg by mouth daily before breakfast.    [provider]  losartan  (COZAAR ) 50 MG tablet Take 50 mg by mouth every morning.    [provider]  MELATONIN GUMMIES PO Take 10 mg by mouth at bedtime.    [provider]  metFORMIN  (GLUCOPHAGE ) 500 MG tablet Take 500 mg by mouth 2 (two) times daily. 07/21/21   [provider]  mineral oil-hydrophilic petrolatum (AQUAPHOR) ointment Apply 1 Application topically daily as needed for dry skin.    [provider]  montelukast  (SINGULAIR ) 10 MG tablet TAKE 1 TABLET  BY MOUTH AT  BEDTIME 07/03/24   Iva Marty Saltness, MD  Multiple Vitamins-Minerals (HAIR SKIN & NAILS PO) Take 1 tablet by mouth daily.    [provider]  Multiple Vitamins-Minerals (MULTIVITAMIN WITH MINERALS) tablet Take 1 tablet by mouth daily. Woman    [provider]  Nebulizer MISC 1 Device by Other route as directed. Nebulizer tubing kit 10/25/23   Iva Marty Saltness, MD  ondansetron  (ZOFRAN -ODT) 8 MG disintegrating tablet Take 8 mg by mouth daily as needed for vomiting or nausea.    [provider]  [place] ULTRA test strip 1 each by Other route in the morning, at noon, in the evening, and at bedtime. 10/17/19   [provider]  Potassium Chloride  ER 20 MEQ TBCR Take 20 mEq by mouth daily. 01/04/21  [provider]  sertraline  (ZOLOFT ) 25 MG tablet Take 1 tablet (25 mg total) by mouth at bedtime. 07/10/24 10/08/24  Vickey Mettle, MD  Spacer/Aero-Holding Chambers DEVI 1 Device by Does not apply route as directed. 10/25/23   Iva Marty Saltness, MD  Tezepelumab -ekko (TEZSPIRE ) 210 MG/1. SOAJ Inject 210 mg into the skin every 28 (twenty-eight) days. 02/28/24   Cari Arlean HERO, FNP  topiramate  (TOPAMAX ) 100 MG tablet TAKE 2  TABLETS BY MOUTH  IN THE MORNING AND 1 TABLET IN  THE EVENING 01/29/24   Wertman, Sara E, PA-C    Allergies: Alpha-gal, Ativan [lorazepam], and Phenergan [promethazine hcl]    Review of Systems  Constitutional:  Negative for chills and fever.  HENT: Negative.    Eyes: Negative.   Respiratory:  Negative for chest tightness and shortness of breath.   Cardiovascular:  Negative for chest pain.  Gastrointestinal:  Negative for abdominal pain and nausea.  Genitourinary: Negative.   Musculoskeletal:  Negative for arthralgias, joint swelling and neck pain.  Skin:  Positive for wound. Negative for rash.       Postoperative hematoma.  Neurological:  Negative for dizziness, weakness, light-headedness, numbness and headaches.   Psychiatric/Behavioral: Negative.      Updated Vital Signs BP (!) 133/53   Pulse 63   Temp (!) 97.3 F (36.3 C)   Resp 15   SpO2 98%   Physical Exam Vitals and nursing note reviewed.  Constitutional:      Appearance: She is well-developed.  HENT:     Head: Normocephalic and atraumatic.  Eyes:     Conjunctiva/sclera: Conjunctivae normal.  Cardiovascular:     Rate and Rhythm: Normal rate.  Pulmonary:     Effort: Pulmonary effort is normal.     Breath sounds: No wheezing.  Musculoskeletal:        General: Normal range of motion.     Cervical back: Normal range of motion.  Skin:    General: Skin is warm and dry.     Comments: 5 x 7 cm area of slightly tender induration left buttock at site of lipoma surgery.  There is no active purulent drainage, there is a serous sanguinous discharge on the dressing.  Neurological:     Mental Status: She is alert and oriented to person, place, and time.     (all labs ordered are listed, but only abnormal results are displayed) Labs Reviewed  CBG MONITORING, ED - Abnormal; Notable for the following components:      Result Value   Glucose-Capillary 108 (*)    All other components within normal limits    EKG: None  Radiology: No results found.   Procedures   Medications Ordered in the ED - No data to display                                  Medical Decision Making Patient presenting with persistent pain and continued bloody drainage from a lipoma surgical site of her left buttock.  She developed a hematoma postsurgical which persists, she endorses it is smaller than during her initial postop visit.  She just completed a course of Bactrim  yesterday but was concerned about ongoing infection.  Exam is reassuring there is no purulent drainage in her wound edges although they are pink, not erythematous and without erythema suggesting cellulitis.  There is a trace of serosanguineous drainage when dabbed with the 4 x 4.  Amount  and/or Complexity of Data Reviewed Discussion of management or test interpretation with external provider(s): Patient was discussed with Dr. Evonnie who also included Dr Kallie in conversation.  No indication for further tx today,  no continued abx.  Plan f/u tomorrow with Dr. Kallie        Final diagnoses:  Post-operative pain    ED Discharge Orders     None          Laura [name] 08/28/24 1314    Laura Donnice PARAS, MD 08/28/24 1742

## 2024-08-28 NOTE — Discharge Instructions (Signed)
 Your exam today is reassuring but do follow-up with Dr. Kallie tomorrow as you have already scheduled.  You may also try a gentle heating pad to this hematoma site for no more than 20 minutes 2-3 times daily.  This can help resolve the hematoma quicker.  Do not fall asleep on a heating pad as this can burn your skin.

## 2024-08-28 NOTE — Progress Notes (Signed)
 IN PERSON    I connected with Laura [name] on 08/28/24 at  9:00 AM EDT in person and verified that I am speaking with the correct person using two identifiers.   Location: Patient: office Provider: office   I discussed the limitations of evaluation and management by telemedicine and the availability of in person appointments. The patient expressed understanding and agreed to proceed. (IN PERSON)     THERAPIST PROGRESS NOTE   Session Time: 9:00 AM-:9:45 AM   Participation Level: Active   Behavioral Response: CasualAlertDepressed   Type of Therapy: Individual Therapy   Treatment Goals addressed: Coping/ non-medication treatment therapy for PTSD.   Interventions: CBT, DBT, Solution Focused, Strength-based and Supportive   Summary: Laura [name] is a 63 y.o. female who presents with PTSD. The OPT therapist worked with the patient for her ongoing OPT treatment session. The OPT therapist utilized Motivational Interviewing to assist in creating therapeutic repore.The patient in the session was engaged and work in collaboration giving feedback about her triggers and symptoms over the past few weeks.  The patient spoke about her post op recovery from having a place removed and having healing complications post op and will be returning within next 24hrs to have this looked at further by medical staff..The OPT therapist utilized Cognitive Behavioral Therapy through cognitive restructuring as well as worked with the patient on coping strategies to assist in management of mental health symptoms. The OPT therapist worked with the patient looking at interactions with family during the past few weeks and leading into the upcoming holidays.The patient has been utilizing her coping skills for Fall and her sister as a support person helping her with yard work and getting out of the home utilizing change of environment and being active.. The OPT therapist examined with the patient patterns of interaction  that have been historically triggering for the patient and worked with the patient on management of her interaction and the impact of interactions with key individuals in her family. The OPT therapist placed emphasis on the patient keeping all upcoming health appointments and adhering to ongoing health treatment recommendations.The patient spoke about her goal of getting to baseline and not being triggered by family interactions into regressing from pre-existing trauma. The OPT therapist reviewed all appointments listed coming up in the patients MyChart    Suicidal/Homicidal: Nowithout intent/plan   Therapist Response:The OPT therapist worked with the patient for the patients scheduled session. The patient was engaged in her session and gave feedback in relation to triggers, symptoms, and behavior responses over the past few weeks. The patient spoke about her post op recovery from having a place removed and having healing complications post op and will be returning within next 24hrs to have this looked at further by medical staff . The OPT therapist worked with the patient utilizing an in session Cognitive Behavioral Therapy exercise. The OPT therapist worked with the patient allowing a safe place to vent and talk about current stressors. The patient has continued to work with her physical health providers to manage physical health conditions. The patient spoke about her mood and interactions with family recently The patient spoke about looking forward to  spending time with family members she gets along with as she is hosting at her home for Thanksgiving. The OP therapist overviewed elements of basic needs and self care in the session.The OPT therapist will continue treatment work with the patient in her next scheduled session.   Plan: Return again in 2/3 weeks.   Diagnosis:  Axis I: Post Traumatic Stress Disorder                            Axis II: No diagnosis     Collaboration of Care: Overview  with the patient of involvement in the Med Management program with Dr. Vickey.   Patient/Guardian was advised Release of Information must be obtained prior to any record release in order to collaborate their care with an outside provider. Patient/Guardian was advised if they have not already done so to contact the registration department to sign all necessary forms in order for us  to release information regarding their care.    Consent: Patient/Guardian gives verbal consent for treatment and assignment of benefits for services provided during this visit. Patient/Guardian expressed understanding and agreed to proceed.      I discussed the assessment and treatment plan with the patient. The patient was provided an opportunity to ask questions and all were answered. The patient agreed with the plan and demonstrated an understanding of the instructions.   The patient was advised to call back or seek an in-person evaluation if the symptoms worsen or if the condition fails to improve as anticipated.   I provided 45 minutes of face-to-face time during this encounter.   Jerel Pepper, LCSW    08/28/2024

## 2024-08-28 NOTE — Telephone Encounter (Signed)
 Surgical Date: 08/12/2024 Procedure: EXCISION MASS LOWER EXTREMITIES , LEFT (LIPOMA)  Received call from patient [phone]~ telephone.   Patient reports that she has increased drainage from hematoma to left buttock/ upper thigh. States that her sister reports red hard ring to surrounding skin. Patient denies fever/ chills.   Patient is highly anxious about wound/ infection and requested appointment. Advised that provider is out of the office today and she is scheduled for early in the AM.   Patient should have completed 5 day course of Bactrim  on 08/27/2024, but she is unsure of her last dose.   Patient states that she wants wound to be evaluated today and will be going to ER.

## 2024-08-28 NOTE — ED Triage Notes (Signed)
 Pt with cyst to left buttock removed on 11/3, pt believes it may be infected. Continues to drain a lot.

## 2024-08-28 NOTE — ED Notes (Signed)
 Dressed Pt wound with bacitracin and telfa bandage.

## 2024-08-29 ENCOUNTER — Other Ambulatory Visit (HOSPITAL_COMMUNITY)
Admission: RE | Admit: 2024-08-29 | Discharge: 2024-08-29 | Disposition: A | Discharge status: Home / Self Care | Source: Ambulatory Visit | Attending: Internal Medicine | Admitting: Internal Medicine

## 2024-08-29 ENCOUNTER — Ambulatory Visit (INDEPENDENT_AMBULATORY_CARE_PROVIDER_SITE_OTHER): Admitting: Internal Medicine

## 2024-08-29 ENCOUNTER — Encounter: Payer: Self-pay | Admitting: General Surgery

## 2024-08-29 ENCOUNTER — Ambulatory Visit (INDEPENDENT_AMBULATORY_CARE_PROVIDER_SITE_OTHER): Admitting: General Surgery

## 2024-08-29 VITALS — BP 110/64 | HR 63 | Temp 97.7°F | Resp 16 | Ht 64.0 in | Wt 149.0 lb

## 2024-08-29 VITALS — BP 125/71 | HR 62 | Temp 98.7°F | Ht 64.0 in | Wt 148.4 lb

## 2024-08-29 DIAGNOSIS — R2242 Localized swelling, mass and lump, left lower limb: Secondary | ICD-10-CM

## 2024-08-29 DIAGNOSIS — R634 Abnormal weight loss: Secondary | ICD-10-CM | POA: Insufficient documentation

## 2024-08-29 DIAGNOSIS — K5900 Constipation, unspecified: Secondary | ICD-10-CM

## 2024-08-29 DIAGNOSIS — R110 Nausea: Secondary | ICD-10-CM

## 2024-08-29 DIAGNOSIS — T8130XA Disruption of wound, unspecified, initial encounter: Secondary | ICD-10-CM

## 2024-08-29 DIAGNOSIS — R6881 Early satiety: Secondary | ICD-10-CM | POA: Diagnosis not present

## 2024-08-29 DIAGNOSIS — T148XXA Other injury of unspecified body region, initial encounter: Secondary | ICD-10-CM

## 2024-08-29 NOTE — Patient Instructions (Signed)
 Area packed today. The RN will remove tomorrow and will repack the area. You can then remove the packing on Saturday and continue to cover the wound with gauze and tape.   We will see you again on Tuesday of next week.  Take tylenol  for pain.  Heat on the area will help some. The hardness is inflammation from the bruising/ blood under the skin where you bled after the mass was removed.

## 2024-08-29 NOTE — Patient Instructions (Signed)
 I am going to check your alpha gal panel at [facility] today.  Would recommend you trial off your Trulicity  for 6 to 8 weeks and see how you do as far as your GI symptoms and ongoing weight loss.  Follow-up in 3 months.  It was very nice seeing you again today.  Dr. Cindie

## 2024-08-29 NOTE — Progress Notes (Signed)
 [facility]  Patient worried about her wound and went to ED. The photo reviewed by my partner and myself and no signs of infection, reassurance given to the patient in the ED.   BP 110/64   Pulse 63   Temp 97.7 F (36.5 C) (Oral)   Resp 16   Ht 5' 4 (1.626 m)   Wt 149 lb (67.6 kg)   SpO2 98%   BMI 25.58 kg/m  Wound probed and old hematoma noted, packed with iodoform packing, no erythema or purulence noted   Patient with hematoma and wound dehiscence, no signs of infection.  Area packed today. The RN will remove tomorrow and will repack the area. You can then remove the packing on Saturday and continue to cover the wound with gauze and tape.   We will see you again on Tuesday of next week.  Take tylenol  for pain.  Heat on the area will help some. The hardness is inflammation from the bruising/ blood under the skin where you bled after the mass was removed.   Future Appointments  Date Time Provider Department Center  08/29/2024 10:10 AM Cindie Carlin POUR, DO [facility]GA RGA  08/30/2024  7:00 AM CVD HVT DEVICE REMOTES [facility] H&V  08/30/2024  9:30 AM Pappayliou, Dorothyann LABOR, DO RS-RS None  09/03/2024  9:15 AM Kallie [name] BROCKS, MD RS-RS None  09/18/2024  8:20 AM Vickey Mettle, MD [facility] None  10/02/2024  9:00 AM Franchot Larve T, LCSW BH-BHRA None  11/26/2024  9:00 AM Lenis Ethelle ORN, MD REA-REA None  11/26/2024  9:30 AM Kristine Ronal BIRCH, RD NDM-NDMR None  11/27/2024  9:20 AM Alvan Dorn FALCON, MD [facility] [place] [facility]  11/29/2024  7:00 AM CVD HVT DEVICE REMOTES [facility]MAGST H&V  01/28/2025 11:30 AM Dina Camie BRAVO, PA-C LBN-LBNG None  01/29/2025 10:15 AM Iva Marty Saltness, MD [facility]RE[facility] None   [name] Kallie, MD [facility] [address] [place], [place] [address] [phone] (office)

## 2024-08-29 NOTE — Progress Notes (Signed)
 Referring Provider: Shona Norleen PEDLAR, MD Primary Care Physician:  Shona Norleen PEDLAR, MD Primary GI:  Dr. Cindie  Chief Complaint  Patient presents with   Follow-up    Follow up on constipation. Pt is still having some trouble due to pain meds she had to take    HPI:   Laura [name] is a 63 y.o. female who presents to the clinic for follow up visit. Multiple GI complaints for me today.   She complains of weight loss. Endorses poor appetitive. Weight is 148 lbs today. This has steadily declined from 218 lbs (January 2024). Notes chronic diarrhea. 1-2 BMs a day. Mostly loose. She has a history of Alpha gal. She has avoiding red meat, pork, lamb as well as dairy. Occasional nausea. States she gets full fast.    Previous workup:  Underwent GES 07/17/24 WNL  Labs from April 05, 2024: White blood cell count 4.2, hemoglobin 14.9, MCV 106, platelets 156,000, glucose 171, creatinine 0.71, albumin 4.4, total bilirubin 0.2, alk phos 70, AST 15, ALT 15, B12 926, A1c 6   Labs from May 2025: IgE 8 (within normal limits), 0215-IgE alpha gal 0.11A (equivocal/low) IgE pork <0.1, IgE beef <0.1, IgE lamb <0.1  CTA abd/pelvis 12/2022: IMPRESSION: 1. No evidence for aortic dissection or aneurysm. 2. No evidence for active gastrointestinal bleeding. Aortic Atherosclerosis (ICD10-I70.0). NON-VASCULAR 1. No acute localizing process in the abdomen or pelvis. 2. Sigmoid colon diverticulosis.   BPE 04/2021: No definite mass or stricture in esophagus. Small sliding type hiatal hernia. 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    EGD 11/2023: -zline regular -gastritis s/p bx. No h.pylori. -normal duodenal bulb, first portion of duodenum and second portion of duodenum -esophageal dilation   Colonoscopy 02/2024: -nonbleeding internal hemorrhoid -diverticulosis -two 4-78mm polyps in descending colon, tubular adenomas -random colon biopsies, neg -next colonoscopy in 5  years    Colonoscopy 01/2021: Non-bleeding internal hemorrhoids. Diverticulosis Two 5-8 mm ascending colon polyps -path tubular adenomas Next colonoscopy in five years      Past Medical History:  Diagnosis Date   Anemia    Aortic atherosclerosis    Asthma    Atrial fibrillation and flutter (HCC)    a.) CHA2DS2VASc = 6 (sex, HTN, TIA x2, vascular disease history, T2DM);  b.) DCCV ~ 2009; c.) DCCV 08/29/2018 (200J x1 --> SR with 1st degree AVB); d.) rate/rhythm maintained on oral diltiazem  + bisoprolol ; chronically anticoagulated with apixaban    Bipolar disorder (HCC)    Carpal tunnel syndrome of right wrist    CHB (complete heart block) (HCC)    a.) s/p St. Jude dual chamber PPM placement   CHF (congestive heart failure) (HCC)    Complication of anesthesia    a.) allergic reaction to perioperative analgesics + anesthetic medications in [place] ; unsure of combination; not able to recount reactions/events --> states I ended up in the ICU for 5 days   Constipation    a.) on linaclotide    COPD (chronic obstructive pulmonary disease) (HCC)    DDD (degenerative disc disease), thoracic    GERD (gastroesophageal reflux disease)    H/O congenital atrial septal defect (ASD) repair    Hallux valgus of right foot    Hammertoe of second toe of right foot    History of 2019 novel coronavirus disease (COVID-19)    a.) 10/2020; b.) 05/2021   HLD (hyperlipidemia)    HTN (hypertension)    Hypogammaglobulinemia    a.) on Curitru   Hypothyroid  IgA deficiency (HCC)    IgE deficiency    Long term current use of anticoagulant    a.) apixaban    Lumbar stenosis    Migraines    Mitral stenosis 02/08/2022   a.) TTE 02/08/2022: EF 60-65%, mild MAC, mild MV stenosis (peak grad 14.6 / mean grad 4.0)   Neuropathy    NSVT (nonsustained ventricular tachycardia) (HCC) 10/31/2018   a.) Holter 10/31/2018: 6 beat run   Obesity    OSA on CPAP    Osteoarthritis    Presence of permanent  cardiac 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 Jude dual chamber device; placed for symptomatic bradycardia secondary to intermittent CHB   PTSD (post-traumatic stress disorder)    Pulmonary nodules    Recurrent sinusitis    Short-term memory loss    Sleep difficulties    a.) takes melatonin   Symptomatic bradycardia    a.) s/p St. Jude dual chamber PPM placement   Syncope    TIA (transient ischemic attack)    x3-last one in 2015   Tremors of nervous system    Type 2 diabetes mellitus (HCC)     Past Surgical History:  Procedure Laterality Date   ASD REPAIR  1968   BALLOON DILATION N/A 11/27/2023   Procedure: BALLOON DILATION;  Surgeon: Cindie Carlin POUR, DO;  [facility];  Service: Endoscopy;  Laterality: N/A;  9:00 am, asa 3   BIOPSY  01/26/2021   Procedure: BIOPSY;  Surgeon: Cindie Carlin POUR, DO;  [facility];  Service: Endoscopy;;  gastric   BIOPSY  11/27/2023   Procedure: BIOPSY;  Surgeon: Cindie Carlin POUR, DO;  [facility];  Service: Endoscopy;;   CARDIOVERSION  2009   CARDIOVERSION N/A 08/29/2018   Procedure: CARDIOVERSION;  Surgeon: Alvan Dorn FALCON, MD;  [facility];  Service: Endoscopy;  Laterality: N/A;   CARPAL TUNNEL RELEASE Bilateral    CESAREAN SECTION  1986, 1988, 1994   COLONOSCOPY N/A 02/15/2024   Procedure: COLONOSCOPY;  Surgeon: Cindie Carlin POUR, DO;  [facility];  Service: Endoscopy;  Laterality: N/A;  8:30 am, asa 3   COLONOSCOPY WITH PROPOFOL  N/A 01/26/2021   Procedure: COLONOSCOPY WITH PROPOFOL ;  Surgeon: Cindie Carlin POUR, DO;  [facility];  Service: Endoscopy;  Laterality: N/A;  am appt, diabetic   ESOPHAGOGASTRODUODENOSCOPY (EGD) WITH PROPOFOL  N/A 01/26/2021   Procedure: ESOPHAGOGASTRODUODENOSCOPY (EGD) WITH PROPOFOL ;  Surgeon: Cindie Carlin POUR, DO;  [facility];  Service: Endoscopy;  Laterality: N/A;   ESOPHAGOGASTRODUODENOSCOPY (EGD) WITH PROPOFOL  N/A 11/27/2023   Procedure:  ESOPHAGOGASTRODUODENOSCOPY (EGD) WITH PROPOFOL ;  Surgeon: Cindie Carlin POUR, DO;  [facility];  Service: Endoscopy;  Laterality: N/A;  9:00 am, asa 3   EXCISION MASS LOWER EXTREMETIES Left 08/12/2024   Procedure: EXCISION MASS LOWER EXTREMITIES;  Surgeon: Kallie [name] BROCKS, MD;  Location: [facility];  Service: General;  Laterality: Left;   HALLUX VALGUS AUSTIN Right 07/29/2022   Procedure: JULIOUS SOL;  Surgeon: Silva Juliene SAUNDERS, DPM;  Location: [facility];  Service: Podiatry;  Laterality: Right;   HAMMER TOE SURGERY Right 07/29/2022   Procedure: HAMMER TOE CORRECTION 2ND;  Surgeon: Silva Juliene SAUNDERS, DPM;  Location: [facility];  Service: Podiatry;  Laterality: Right;   LEFT HEART CATH AND CORONARY ANGIOGRAPHY N/A 08/30/2019   Procedure: LEFT HEART CATH AND CORONARY ANGIOGRAPHY;  Surgeon: Dann Candyce RAMAN, MD;  [facility] INVAS[facility] CV LAB;  Service: Cardiovascular;  Laterality: N/A;  NASAL SINUS SURGERY  2017   PACEMAKER IMPLANT N/A 03/03/2022   Procedure: PACEMAKER IMPLANT;  Surgeon: Waddell Danelle ORN, MD;  Location: [facility] INVASIVE CV LAB;  Service: Cardiovascular;  Laterality: N/A;   POLYPECTOMY  01/26/2021   Procedure: POLYPECTOMY;  Surgeon: Cindie Carlin POUR, DO;  [facility];  Service: Endoscopy;;  colon   TOOTH EXTRACTION N/A 07/04/2023   Procedure: DENTAL RESTORATION/EXTRACTIONS;  Surgeon: Sheryle Hamilton, DMD;  Location: [facility];  Service: Oral Surgery;  Laterality: N/A;    Current Outpatient Medications  Medication Sig Dispense Refill   acetaminophen  (TYLENOL ) 500 MG tablet Take 500 mg by mouth every 8 (eight) hours as needed.     albuterol  (PROVENTIL ) (2.5 MG/3ML) 0.083% nebulizer solution Take 2.5 mg by nebulization every 4 (four) hours as needed for wheezing.     atorvastatin  (LIPITOR) 20 MG tablet Take 20 mg by mouth at bedtime.     BD PEN NEEDLE NANO 2ND GEN 32G X 4 MM MISC 1 each by Other route in the morning, at noon, in the evening, and at bedtime. 90 each 0    bisoprolol  (ZEBETA ) 10 MG tablet TAKE 1 TABLET BY MOUTH DAILY 100 tablet 2   blood glucose meter kit and supplies 1 each by Other route 4 (four) times daily. Dispense based on patient and insurance preference. Use up to four times daily as directed. (FOR ICD-10 E10.9, E11.9). 1 each 0   budesonide -formoterol  (SYMBICORT ) 160-4.5 MCG/ACT inhaler Inhale 2 puffs into the lungs 2 (two) times daily.     Cholecalciferol (VITAMIN D3) 125 MCG (5000 UT) CAPS Take 1 capsule (5,000 Units total) by mouth daily. 90 capsule 0   Continuous Glucose Sensor (FREESTYLE LIBRE 3 SENSOR) MISC 1 Piece by Does not apply route every 14 (fourteen) days. Place 1 sensor on the skin every 14 days. Use to check glucose continuously 6 each 1   diltiazem  (CARTIA  XT) 300 MG 24 hr capsule Take 1 capsule (300 mg total) by mouth daily. 100 capsule 2   divalproex  (DEPAKOTE  ER) 500 MG 24 hr tablet Take 1 tablet (500 mg total) by mouth daily AND 2 tablets (1,000 mg total) at bedtime. (Patient taking differently: Take 1.5 tablet (750 mg total) by mouth daily AND 2 tablets (1,000 mg total) at bedtime.) 270 tablet 0   Dulaglutide  (TRULICITY ) 3 MG/0.5ML SOAJ Inject 3 mg into the skin once a week.     ELDERBERRY PO Take 4 g by mouth daily. Gummies 2g each     ELIQUIS  5 MG TABS tablet TAKE 1 TABLET BY MOUTH TWICE  DAILY 200 tablet 2   EPINEPHrine  0.3 mg/0.3 mL IJ SOAJ injection Inject 0.3 mg into the muscle once as needed for anaphylaxis. 2 each 1   EPINEPHrine  0.3 mg/0.3 mL IJ SOAJ injection Inject 0.3 mg into the muscle as needed for anaphylaxis. As needed for life-threatening allergic reactions 2 each 1   FERROUS SULFATE  PO Take 65 mg by mouth every evening.     gabapentin  (NEURONTIN ) 600 MG tablet Take 600 mg by mouth 3 (three) times daily.     Immune Globulin , Human, (CUVITRU  [place]) Inject 27 mg into the skin every 14 (fourteen) days.     Insulin  Pen Needle (PEN NEEDLES 3/16) 31G X 5 MM MISC Use as directed with insulin  pen 100 each 0    JARDIANCE  10 MG TABS tablet TAKE 1 TABLET BY MOUTH DAILY  BEFORE BREAKFAST 100 tablet 2   levalbuterol  (XOPENEX  HFA) 45 MCG/ACT inhaler USE  2 INHALATIONS BY MOUTH EVERY 6 HOURS AS NEEDED FOR WHEEZING 30 g 6   levalbuterol  (XOPENEX ) 1.25 MG/3ML nebulizer solution Use 1 vial in the nebulizer every 6 hours as needed for cough, wheeze, shortness of breath or chest tightness. 225 mL 1   levothyroxine  (SYNTHROID , LEVOTHROID) 112 MCG tablet Take 112 mcg by mouth daily before breakfast.     linaclotide  (LINZESS ) 145 MCG CAPS capsule Take 145 mcg by mouth daily before breakfast.     losartan  (COZAAR ) 50 MG tablet Take 50 mg by mouth every morning.     MELATONIN GUMMIES PO Take 10 mg by mouth at bedtime.     metFORMIN  (GLUCOPHAGE ) 500 MG tablet Take 500 mg by mouth 2 (two) times daily.     mineral oil-hydrophilic petrolatum (AQUAPHOR) ointment Apply 1 Application topically daily as needed for dry skin.     montelukast  (SINGULAIR ) 10 MG tablet TAKE 1 TABLET BY MOUTH AT  BEDTIME 100 tablet 2   Multiple Vitamins-Minerals (HAIR SKIN & NAILS PO) Take 1 tablet by mouth daily.     Multiple Vitamins-Minerals (MULTIVITAMIN WITH MINERALS) tablet Take 1 tablet by mouth daily. Woman     Nebulizer MISC 1 Device by Other route as directed. Nebulizer tubing kit 1 each 1   ondansetron  (ZOFRAN -ODT) 8 MG disintegrating tablet Take 8 mg by mouth daily as needed for vomiting or nausea.     ONETOUCH ULTRA test strip 1 each by Other route in the morning, at noon, in the evening, and at bedtime.     Potassium Chloride  ER 20 MEQ TBCR Take 20 mEq by mouth daily.     sertraline  (ZOLOFT ) 25 MG tablet Take 1 tablet (25 mg total) by mouth at bedtime. 90 tablet 0   Spacer/Aero-Holding Chambers DEVI 1 Device by Does not apply route as directed. 1 each 1   sulfamethoxazole -trimethoprim  (BACTRIM  DS) 800-160 MG tablet Take 1 tablet by mouth 2 (two) times daily.     Tezepelumab -ekko (TEZSPIRE ) 210 MG/1. SOAJ Inject 210 mg into the skin  every 28 (twenty-eight) days. 1.91 mL 11   topiramate  (TOPAMAX ) 100 MG tablet TAKE 2  TABLETS BY MOUTH  IN THE MORNING AND 1 TABLET IN  THE EVENING 450 tablet 3   Azelastine  HCl 137 MCG/SPRAY SOLN Place 1 spray into both nostrils daily. (Patient not taking: Reported on 08/29/2024) 90 mL 1   hydrOXYzine  (ATARAX ) 25 MG tablet TAKE ONE TABLET BY MOUTH THREE TIMES DAILY AS NEEDED FOR ANXIETY OR FOR ITCHING (Patient not taking: Reported on 08/29/2024) 30 tablet 1   lansoprazole  (PREVACID ) 30 MG capsule Take 30 mg by mouth 2 (two) times daily before a meal. (Patient not taking: Reported on 08/29/2024)     Current Facility-Administered Medications  Medication Dose Route Frequency Provider Last Rate Last Admin   tezepelumab -ekko (TEZSPIRE ) 210 MG/1. syringe 210 mg  210 mg Subcutaneous Q28 days Iva Marty Saltness, MD   210 mg at 04/01/24 9060    Allergies as of 08/29/2024 - Review Complete 08/29/2024  Allergen Reaction Noted   Alpha-gal  03/18/2024   Ativan [lorazepam] Hives 02/06/2018   Phenergan [promethazine hcl] Hives 02/06/2018    Family History  Problem Relation Age of Onset   Schizophrenia Mother    Hypertension Mother    Stroke Mother    Kidney disease Mother    Heart attack Mother    Alcohol abuse Mother    Colon cancer Father        colon cancer   Kidney disease  Sister    Hypertension Sister    Bipolar disorder Sister    Atrial fibrillation Sister    Schizophrenia Sister    Heart disease Brother    Prostate cancer Brother    Stroke Maternal Aunt    Heart disease Maternal Aunt    Thyroid  disease Daughter    Hashimoto's thyroiditis Daughter    Celiac disease Daughter    Allergic rhinitis Daughter    Asthma Neg Hx     Social History   Socioeconomic History   Marital status: Widowed    Spouse name: Not on file   Number of children: 3   Years of education: 10   Highest education level: Not on file  Occupational History   Not on file  Tobacco Use   Smoking  status: Every Day    Average packs/day: 1 pack/day for 28.0 years (28.0 ttl pk-yrs)    Types: Cigarettes    Start date: 33    Last attempt to quit: 2008    Years since quitting: 17.8   Smokeless tobacco: Never   Tobacco comments:    Started back smoking   Vaping Use   Vaping status: Never Used  Substance and Sexual Activity   Alcohol use: Not Currently   Drug use: Not Currently   Sexual activity: Not Currently    Birth control/protection: Post-menopausal  Other Topics Concern   Not on file  Social History Narrative   Right handed   Drinks caffeine   One story home   Social Drivers of Health   Financial Resource Strain: High Risk (01/06/2022)   Overall Financial Resource Strain (CARDIA)    Difficulty of Paying Living Expenses: Hard  Food Insecurity: No Food Insecurity (01/18/2024)   Hunger Vital Sign    Worried About Running Out of Food in the Last Year: Never true    Ran Out of Food in the Last Year: Never true  Transportation Needs: No Transportation Needs (01/18/2024)   PRAPARE - Administrator, Civil Service (Medical): No    Lack of Transportation (Non-Medical): No  Physical Activity: Insufficiently Active (01/06/2022)   Exercise Vital Sign    Days of Exercise per Week: 4 days    Minutes of Exercise per Session: 30 min  Stress: No Stress Concern Present (01/06/2022)   [employer] of Occupational Health - Occupational Stress Questionnaire    Feeling of Stress : Only a little  Social Connections: Moderately Integrated (01/06/2022)   Social Connection and Isolation Panel    Frequency of Communication with Friends and Family: More than three times a week    Frequency of Social Gatherings with Friends and Family: More than three times a week    Attends Religious Services: More than 4 times per year    Active Member of [employer] or Organizations: Yes    Attends Banker Meetings: More than 4 times per year    Marital Status: Widowed     Subjective: Review of Systems  Constitutional:  Negative for chills and fever.  HENT:  Negative for congestion and hearing loss.   Eyes:  Negative for blurred vision and double vision.  Respiratory:  Negative for cough and shortness of breath.   Cardiovascular:  Negative for chest pain and palpitations.  Gastrointestinal:  Positive for abdominal pain, constipation, diarrhea, heartburn and nausea. Negative for blood in stool, melena and vomiting.  Genitourinary:  Negative for dysuria and urgency.  Musculoskeletal:  Negative for joint pain and myalgias.  Skin:  Negative  for itching and rash.  Neurological:  Negative for dizziness and headaches.  Psychiatric/Behavioral:  Negative for depression. The patient is not nervous/anxious.      Objective: BP 125/71   Pulse 62   Temp 98.7 F (37.1 C)   Ht 5' 4 (1.626 m)   Wt 148 lb 6.4 oz (67.3 kg)   BMI 25.47 kg/m  Physical Exam Constitutional:      Appearance: Normal appearance.  HENT:     Head: Normocephalic and atraumatic.  Eyes:     Extraocular Movements: Extraocular movements intact.     Conjunctiva/sclera: Conjunctivae normal.  Cardiovascular:     Rate and Rhythm: Normal rate and regular rhythm.  Pulmonary:     Effort: Pulmonary effort is normal.     Breath sounds: Normal breath sounds.  Abdominal:     General: Bowel sounds are normal.     Palpations: Abdomen is soft.  Musculoskeletal:        General: No swelling. Normal range of motion.     Cervical back: Normal range of motion and neck supple.  Skin:    General: Skin is warm and dry.     Coloration: Skin is not jaundiced.  Neurological:     General: No focal deficit present.     Mental Status: She is alert and oriented to person, place, and time.  Psychiatric:        Mood and Affect: Mood normal.        Behavior: Behavior normal.      Assessment/Plan:  1.  Chronic nausea, weight loss, abdominal pain-significant workup per HPI.  Chronically on Trulicity   which is likely contributing to her issues.  Recommend she discuss with her endocrinologist about trying off this medication for 6 to 8 weeks to see if her symptoms improved.  2.  Chronic GERD-well-controlled on lansoprazole .  Will continue.  3.  Diarrhea and constipation-historically constipated, continue on Linzess .  4.  Alpha gal syndrome-patient with mildly elevated levels to alpha gal.  She is requesting recheck today.  Will order.  Call with results.  Follow-up in 3 months.   08/29/2024 10:19 AM

## 2024-08-30 ENCOUNTER — Other Ambulatory Visit: Payer: Self-pay

## 2024-08-30 ENCOUNTER — Ambulatory Visit (INDEPENDENT_AMBULATORY_CARE_PROVIDER_SITE_OTHER): Admitting: Surgery

## 2024-08-30 ENCOUNTER — Encounter: Payer: Self-pay | Admitting: Surgery

## 2024-08-30 ENCOUNTER — Ambulatory Visit: Payer: Medicare Other

## 2024-08-30 VITALS — BP 145/77 | HR 69 | Temp 97.7°F | Resp 16 | Ht 64.0 in | Wt 148.0 lb

## 2024-08-30 DIAGNOSIS — Z09 Encounter for follow-up examination after completed treatment for conditions other than malignant neoplasm: Secondary | ICD-10-CM

## 2024-08-30 DIAGNOSIS — I5032 Chronic diastolic (congestive) heart failure: Secondary | ICD-10-CM

## 2024-08-30 DIAGNOSIS — T148XXA Other injury of unspecified body region, initial encounter: Secondary | ICD-10-CM

## 2024-08-30 LAB — CUP PACEART REMOTE DEVICE CHECK
Battery Remaining Longevity: 91 mo
Battery Remaining Percentage: 78 %
Battery Voltage: 2.99 V
Brady Statistic AP VP Percent: 41 %
Brady Statistic AP VS Percent: 1 %
Brady Statistic AS VP Percent: 5.5 %
Brady Statistic AS VS Percent: 52 %
Brady Statistic RA Percent Paced: 41 %
Brady Statistic RV Percent Paced: 46 %
Date Time Interrogation Session: 20251121040015
Implantable Lead Connection Status: 753985
Implantable Lead Connection Status: 753985
Implantable Lead Implant Date: 20230525
Implantable Lead Implant Date: 20230525
Implantable Lead Location: 753859
Implantable Lead Location: 753860
Implantable Pulse Generator Implant Date: 20230525
Lead Channel Impedance Value: 460 Ohm
Lead Channel Impedance Value: 480 Ohm
Lead Channel Pacing Threshold Amplitude: 0.625 V
Lead Channel Pacing Threshold Amplitude: 1 V
Lead Channel Pacing Threshold Pulse Width: 0.5 ms
Lead Channel Pacing Threshold Pulse Width: 0.5 ms
Lead Channel Sensing Intrinsic Amplitude: 11.5 mV
Lead Channel Sensing Intrinsic Amplitude: 2 mV
Lead Channel Setting Pacing Amplitude: 0.875
Lead Channel Setting Pacing Amplitude: 2.5 V
Lead Channel Setting Pacing Pulse Width: 0.5 ms
Lead Channel Setting Sensing Sensitivity: 0.5 mV
Pulse Gen Model: 2272
Pulse Gen Serial Number: 8084453

## 2024-08-30 NOTE — Progress Notes (Signed)
 Surgical Date: 08/12/2024 Procedure: EXCISION MASS LOWER EXTREMITY, LEFT   Patient seen in office to follow up wound care to left buttocks/ upper thigh.   Hematoma continues to drain serosanguinous fluid. No active bleeding noted. No S/Sx of infection noted.   1/4 Iodoform Gauze packed to wound and covered with dry dressing. Advised to change dressing as needed, but to remove packing on Saturday. 08/31/2024. Continue to cover with dry dressing. Keep scheduled follow up with Dr. Kallie.  Future Appointments  Date Time Provider Department Center  09/03/2024  9:15 AM Kallie [name] BROCKS, MD RS-RS None

## 2024-09-01 ENCOUNTER — Ambulatory Visit: Payer: Self-pay | Admitting: Internal Medicine

## 2024-09-01 LAB — MISC LABCORP TEST (SEND OUT): Labcorp test code: 650003

## 2024-09-02 ENCOUNTER — Other Ambulatory Visit: Payer: Self-pay | Admitting: "Endocrinology

## 2024-09-02 ENCOUNTER — Other Ambulatory Visit: Payer: Self-pay | Admitting: Allergy & Immunology

## 2024-09-02 ENCOUNTER — Other Ambulatory Visit: Payer: Self-pay | Admitting: Gastroenterology

## 2024-09-02 ENCOUNTER — Other Ambulatory Visit: Payer: Self-pay | Admitting: Psychiatry

## 2024-09-02 DIAGNOSIS — E1165 Type 2 diabetes mellitus with hyperglycemia: Secondary | ICD-10-CM

## 2024-09-02 DIAGNOSIS — F4310 Post-traumatic stress disorder, unspecified: Secondary | ICD-10-CM

## 2024-09-02 NOTE — Progress Notes (Signed)
 Remote PPM Transmission

## 2024-09-03 ENCOUNTER — Encounter: Payer: Self-pay | Admitting: General Surgery

## 2024-09-03 ENCOUNTER — Encounter: Admitting: General Surgery

## 2024-09-03 ENCOUNTER — Ambulatory Visit (INDEPENDENT_AMBULATORY_CARE_PROVIDER_SITE_OTHER): Admitting: General Surgery

## 2024-09-03 VITALS — BP 159/67 | HR 61 | Temp 97.5°F | Resp 14 | Ht 64.0 in | Wt 153.0 lb

## 2024-09-03 DIAGNOSIS — T8130XA Disruption of wound, unspecified, initial encounter: Secondary | ICD-10-CM

## 2024-09-03 DIAGNOSIS — T148XXA Other injury of unspecified body region, initial encounter: Secondary | ICD-10-CM

## 2024-09-03 NOTE — Patient Instructions (Signed)
 Continue to keep area covered. You can shower and replace bandage. Will check on your next week.

## 2024-09-03 NOTE — Progress Notes (Signed)
 [facility]  Doing well. Packing removed by patient on Saturday. No issues. Less pain and still draining.  BP (!) 159/67   Pulse 61   Temp (!) 97.5 F (36.4 C) (Oral)   Resp 14   Ht 5' 4 (1.626 m)   Wt 153 lb (69.4 kg)   SpO2 98%   BMI 26.26 kg/m  Left buttock clean and minor brownish drainage, no cellulitis, induration improving  Patient s/p excision of a lipoma and wound draining after hematoma and dehiscence. Doing better.   Continue to keep area covered. You can shower and replace bandage. Will check on your next week.   Future Appointments  Date Time Provider Department Center  09/10/2024 11:45 AM Kallie [name] BROCKS, MD RS-RS None  09/18/2024  8:20 AM Vickey Mettle, MD ARPA-[facility]PA None  10/02/2024  9:00 AM Franchot Larve T, LCSW BH-BHRA None  11/26/2024  9:00 AM Lenis Ethelle ORN, MD REA-REA None  11/26/2024  9:30 AM Kristine Ronal BIRCH, RD NDM-NDMR None  11/27/2024  9:20 AM Alvan Dorn FALCON, MD CVD-[facility]ILLE [place] [facility]  11/29/2024  7:00 AM CVD HVT DEVICE REMOTES CVD-MAGST H&V  01/28/2025 11:30 AM Dina Camie BRAVO, PA-C LBN-LBNG None  01/29/2025 10:15 AM Iva Marty Saltness, MD AAC-REIDSVIL None   [name] Kallie, MD [facility] [address] [place], [place] [address] [phone] (office)

## 2024-09-10 ENCOUNTER — Encounter: Payer: Self-pay | Admitting: General Surgery

## 2024-09-10 ENCOUNTER — Ambulatory Visit (INDEPENDENT_AMBULATORY_CARE_PROVIDER_SITE_OTHER): Admitting: General Surgery

## 2024-09-10 VITALS — BP 149/83 | HR 94 | Temp 98.1°F | Resp 16 | Ht 64.0 in | Wt 149.0 lb

## 2024-09-10 DIAGNOSIS — T148XXA Other injury of unspecified body region, initial encounter: Secondary | ICD-10-CM

## 2024-09-10 DIAGNOSIS — T8130XA Disruption of wound, unspecified, initial encounter: Secondary | ICD-10-CM

## 2024-09-10 NOTE — Progress Notes (Signed)
 [facility]  Wound is having minimal drainage. No redness. Feeling better.  BP (!) 149/83   Pulse 94   Temp 98.1 F (36.7 C) (Oral)   Resp 16   Ht 5' 4 (1.626 m)   Wt 149 lb (67.6 kg)   SpO2 94%   BMI 25.58 kg/m  Left buttock incision healing, minor drainage from superficial wound not closed, induration much better, no erythema  Patient s/p excision of lipoma from left buttock. Hematoma and superficial dehiscence getting better.   Keep area covered while it is draining. Call with issues.  Future Appointments  Date Time Provider Department Center  09/18/2024  8:20 AM Vickey Mettle, MD [facility] None  09/25/2024 10:30 AM Kallie [name] BROCKS, MD RS-RS None  10/02/2024  9:00 AM Franchot Larve T, LCSW BH-BHRA None  11/26/2024  9:00 AM Lenis Ethelle ORN, MD REA-REA None  11/26/2024  9:30 AM Kristine Ronal BIRCH, RD NDM-NDMR None  11/27/2024  9:20 AM Alvan Dorn FALCON, MD [facility] [place] [facility]  11/29/2024  7:00 AM CVD HVT DEVICE REMOTES CVD-MAGST H&V  01/28/2025 11:30 AM Dina Camie BRAVO, PA-C LBN-LBNG None  01/29/2025 10:15 AM Iva Marty Saltness, MD AAC-REIDSV[facility] None   [name] Kallie, MD [facility] [address] [place], [place] [address] [phone] (office)

## 2024-09-10 NOTE — Patient Instructions (Signed)
 Keep area covered while it is draining. Call with issues.

## 2024-09-12 ENCOUNTER — Telehealth: Payer: Self-pay

## 2024-09-12 DIAGNOSIS — Z7189 Other specified counseling: Secondary | ICD-10-CM

## 2024-09-12 NOTE — Progress Notes (Signed)
 Complex Care Management Note Care Guide Note  09/12/2024 Name: Laura [name] MRN: [id number] DOB: November 02, 1960   Complex Care Management Outreach Attempts: An unsuccessful telephone outreach was attempted today to offer the patient information about available complex care management services.  Follow Up Plan:  Additional outreach attempts will be made to offer the patient complex care management information and services.   Encounter Outcome:  No Answer  Jeoffrey Buffalo , RMA       [facility], [facility] Guide  Direct Dial: [phone]  Website: [email or web address]

## 2024-09-13 ENCOUNTER — Other Ambulatory Visit: Payer: Self-pay | Admitting: "Endocrinology

## 2024-09-13 ENCOUNTER — Other Ambulatory Visit: Payer: Self-pay | Admitting: Psychiatry

## 2024-09-13 DIAGNOSIS — E1165 Type 2 diabetes mellitus with hyperglycemia: Secondary | ICD-10-CM

## 2024-09-13 DIAGNOSIS — F4310 Post-traumatic stress disorder, unspecified: Secondary | ICD-10-CM

## 2024-09-13 NOTE — Progress Notes (Signed)
 Complex Care Management Note  Care Guide Note 09/13/2024 Name: Laura [name] MRN: [id number] DOB: 1960-12-07  Laura [name] is a 63 y.o. year old female who sees Shona, Norleen PEDLAR, MD for primary care. I reached out to [name] by phone today to offer complex care management services.  [name] was given information about Complex Care Management services today including:   The Complex Care Management services include support from the care team which includes your Nurse Care Manager, Clinical Social Worker, or Pharmacist.  The Complex Care Management team is here to help remove barriers to the health concerns and goals most important to you. Complex Care Management services are voluntary, and the patient may decline or stop services at any time by request to their care team member.   Complex Care Management Consent Status: Patient agreed to services and verbal consent obtained.   Follow up plan:  Telephone appointment with complex care management team member scheduled for:  09/18/2024  Encounter Outcome:  Patient Scheduled  Jeoffrey Buffalo , RMA     [place]  [facility] Guide  Direct Dial: [phone]  Website: [email or web address]
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
## 2024-09-18 ENCOUNTER — Other Ambulatory Visit: Payer: Self-pay

## 2024-09-18 ENCOUNTER — Telehealth: Payer: Self-pay

## 2024-09-18 ENCOUNTER — Encounter: Payer: Self-pay | Admitting: Psychiatry

## 2024-09-18 ENCOUNTER — Telehealth: Admitting: Psychiatry

## 2024-09-18 DIAGNOSIS — F330 Major depressive disorder, recurrent, mild: Secondary | ICD-10-CM

## 2024-09-18 DIAGNOSIS — F4310 Post-traumatic stress disorder, unspecified: Secondary | ICD-10-CM | POA: Diagnosis not present

## 2024-09-18 DIAGNOSIS — Z79899 Other long term (current) drug therapy: Secondary | ICD-10-CM | POA: Diagnosis not present

## 2024-09-18 MED ORDER — DIVALPROEX SODIUM ER 250 MG PO TB24: 250.0000 mg | ORAL_TABLET | Freq: Every day | ORAL | 0 refills | Status: DC

## 2024-09-18 MED ORDER — DIVALPROEX SODIUM ER 500 MG PO TB24: ORAL_TABLET | ORAL | 0 refills | Status: DC

## 2024-09-18 MED ORDER — SERTRALINE HCL 25 MG PO TABS: 25.0000 mg | ORAL_TABLET | Freq: Every day | ORAL | 0 refills | Status: DC

## 2024-09-18 NOTE — Patient Outreach (Signed)
 Returned call to patient- She stated she never received my call at 10:30 for our scheduled 10:30 visit. RNCM phone log shows outgoing call to her at 10:36 (Previous appt.was longer than anticipated). I did explain that the call went straight to voice mail. Patient states that there must be an issue with her hearing aid and phone. I offered patient an appointment this afternoon or Friday- She stated Friday would be better.  I will reach out to patient this Friday at 10:00 AM

## 2024-09-18 NOTE — Patient Instructions (Signed)
 Laura [name] - I am sorry I was unable to reach you today for our scheduled appointment. I work with Shona, Norleen PEDLAR, MD and am calling to support your healthcare needs. Please contact me at [phone] at your earliest convenience. I look forward to speaking with you soon.   Thank you,  Hendricks Her RN, BSN  [place] VBCI-Population Health RN Case Manager   Direct [phone]

## 2024-09-18 NOTE — Patient Instructions (Signed)
 Continue Depakote  750 mg in AM, 1000 mg at night  Continue sertraline  25 mg at night  Obtain labs (VPA) Next appointment: 1/30 at 8 am

## 2024-09-19 ENCOUNTER — Ambulatory Visit: Admitting: General Surgery

## 2024-09-20 ENCOUNTER — Other Ambulatory Visit: Payer: Self-pay

## 2024-09-20 VITALS — Wt 146.0 lb

## 2024-09-20 DIAGNOSIS — Z59869 Financial insecurity, unspecified: Secondary | ICD-10-CM

## 2024-09-20 NOTE — Patient Instructions (Signed)
 Visit Information  [name] was given information about Medicaid Managed Care team care coordination services as a part of their [place] Community Plan Medicaid benefit.   If you would like to schedule transportation through your [facility], please call the following number at least 2 days in advance of your appointment: [phone]   Rides for urgent appointments can also be made after hours by calling Member Services.  Call the Behavioral Health Crisis Line at [phone], at any time, 24 hours a day, 7 days a week. If you are in danger or need immediate medical attention call 911.  Please see education materials related to Diabetes provided by MyChart link.  Care plan and visit instructions communicated with the patient verbally today. Patient agrees to receive a copy in MyChart. Active MyChart status and patient understanding of how to access instructions and care plan via MyChart confirmed with patient.     Telephone follow up appointment with Managed Medicaid care management team member scheduled for: 10-08-2024 at 9:30 AM   Hendricks Her RN, BSN  [place] VBCI-Population Health RN Case Manager   Direct [phone]   Following is a copy of your plan of care:   Goals Addressed             This Visit's Progress    VBCI RN Care Plan       Problems:  Chronic Disease Management support and education needs related to DMII  Goal: Over the next 90 days the Patient will attend all scheduled medical appointments: with providers as evidenced by encounter notes in EPIC        continue to work with Medical Illustrator and/or Social Worker to address care management and care coordination needs related to DMII as evidenced by adherence to care management team scheduled appointments     demonstrate ongoing self health care management ability of diabetes as evidenced by recording blood sugar readings and following a diabetic diet     take all  medications exactly as prescribed and will call provider for medication related questions as evidenced by communication with Provider regarding any medication questions or concerns      Interventions:   Diabetes Interventions: Assessed patient's understanding of A1c goal: <6.5% Provided education to patient about basic DM disease process Reviewed medications with patient and discussed importance of medication adherence Discussed plans with patient for ongoing care management follow up and provided patient with direct contact information for care management team Referral made to social work team for assistance with financial stress and returning to work force  Review of patient status, including review of consultants reports, relevant laboratory and other test results, and medications completed Screening for signs and symptoms of depression related to chronic disease state  Assessed social determinant of health barriers Lab Results  Component Value Date   HGBA1C 6.3 07/23/2024    Patient Self-Care Activities:  Attend all scheduled provider appointments Call pharmacy for medication refills 3-7 days in advance of running out of medications Call provider office for new concerns or questions  Notify RN Care Manager of TOC call rescheduling needs schedule appointment with eye doctor check feet daily for cuts, sores or redness enter blood sugar readings and medication or insulin  into daily log take the blood sugar log to all doctor visits drink 6 to 8 glasses of water each day eat fish at least once per week set a realistic goal  Plan:  Telephone follow up appointment with care management team  member scheduled for:  10-08-2024 at 9:30 AM

## 2024-09-20 NOTE — Patient Outreach (Signed)
 Complex Care Management   Visit Note  09/20/2024  Name:  Laura [name] MRN: [id number] DOB: [birth year]  Situation: Referral received for Complex Care Management related to Diabetes with Complications I obtained verbal consent from Patient.  Visit completed with Patient  on the phone  Background:   Past Medical History:  Diagnosis Date   Anemia    Aortic atherosclerosis    Asthma    Atrial fibrillation and flutter (HCC)    a.) CHA2DS2VASc = 6 (sex, HTN, TIA x2, vascular disease history, T2DM);  b.) DCCV ~ 2009; c.) DCCV 08/29/2018 (200J x1 --> SR with 1st degree AVB); d.) rate/rhythm maintained on oral diltiazem  + bisoprolol ; chronically anticoagulated with apixaban    Bipolar disorder (HCC)    Carpal tunnel syndrome of right wrist    CHB (complete heart block) (HCC)    a.) s/p St. Jude dual chamber PPM placement   CHF (congestive heart failure) (HCC)    Complication of anesthesia    a.) allergic reaction to perioperative analgesics + anesthetic medications in [place] ; unsure of combination; not able to recount reactions/events --> states I ended up in the ICU for 5 days   Constipation    a.) on linaclotide    COPD (chronic obstructive pulmonary disease) (HCC)    DDD (degenerative disc disease), thoracic    GERD (gastroesophageal reflux disease)    H/O congenital atrial septal defect (ASD) repair    Hallux valgus of right foot    Hammertoe of second toe of right foot    History of 2019 novel coronavirus disease (COVID-19)    a.) 10/2020; b.) 05/2021   HLD (hyperlipidemia)    HTN (hypertension)    Hypogammaglobulinemia    a.) on Curitru   Hypothyroid    IgA deficiency (HCC)    IgE deficiency    Long term current use of anticoagulant    a.) apixaban    Lumbar stenosis    Migraines    Mitral stenosis 02/08/2022   a.) TTE 02/08/2022: EF 60-65%, mild MAC, mild MV stenosis (peak grad 14.6 / mean grad 4.0)   Neuropathy    NSVT (nonsustained ventricular tachycardia) (HCC)  10/31/2018   a.) Holter 10/31/2018: 6 beat run   Obesity    OSA on CPAP    Osteoarthritis    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 Jude dual chamber device; placed for symptomatic bradycardia secondary to intermittent CHB   PTSD (post-traumatic stress disorder)    Pulmonary nodules    Recurrent sinusitis    Short-term memory loss    Sleep difficulties    a.) takes melatonin   Symptomatic bradycardia    a.) s/p St. Jude dual chamber PPM placement   Syncope    TIA (transient ischemic attack)    x3-last one in 2015   Tremors of nervous system    Type 2 diabetes mellitus (HCC)     Assessment: Patient Reported Symptoms:  Cognitive Cognitive Status: Alert and oriented to person, place, and time, Normal speech and language skills Cognitive/Intellectual Conditions Management [RPT]: None reported or documented in medical history or problem list   Health Maintenance Behaviors: Annual physical exam, Healthy diet Healing Pattern: Slow Health Facilitated by: Rest, Healthy diet  Neurological Neurological Review of Symptoms: Numbness (fingers in both hands (Dr States pinched nerve in shoulder)) Neurological Management Strategies: Coping strategies, Routine screening  HEENT HEENT Symptoms Reported: Change or loss of hearing (hearing aids) HEENT Management Strategies: Coping strategies, Routine screening HEENT Self-Management Outcome: 4 (good)  Cardiovascular Cardiovascular Symptoms Reported: No symptoms reported Does patient have uncontrolled Hypertension?: No Cardiovascular Management Strategies: Adequate rest, Routine screening Weight: 146 lb (66.2 kg) Cardiovascular Self-Management Outcome: 4 (good)  Respiratory Respiratory Symptoms Reported: Wheezing, Productive cough Other Respiratory Symptoms: white sputum Additional Respiratory Details: no fever Respiratory Management Strategies: Adequate rest, Routine screening, Medication therapy, CPAP Respiratory  Self-Management Outcome: 4 (good)  Endocrine Endocrine Symptoms Reported: No symptoms reported Is patient diabetic?: Yes Is patient checking blood sugars at home?: Yes List most recent blood sugar readings, include date and time of day: 150 am fasting ;  251 post prandial  after creamer in coffee  Dr can see readings Endocrine Self-Management Outcome: 3 (uncertain)  Gastrointestinal Gastrointestinal Symptoms Reported: Unintentional weight loss, Abdominal pain or discomfort, Diarrhea Additional Gastrointestinal Details: has lost weight  See GI Specialist   Had been taking Trulicity   Taking a break now to see if symptoms improve Gastrointestinal Management Strategies: Adequate rest, Medication therapy Gastrointestinal Self-Management Outcome: 3 (uncertain)    Genitourinary Genitourinary Symptoms Reported: No symptoms reported Genitourinary Management Strategies: Adequate rest, Coping strategies Genitourinary Self-Management Outcome: 4 (good)  Integumentary Integumentary Symptoms Reported: Wound Additional Integumentary Details: Sees Dermatologist; surgical wound on left butt cheek Skin Management Strategies: Coping strategies, Routine screening Skin Self-Management Outcome: 4 (good)  Musculoskeletal Musculoskelatal Symptoms Reviewed: Joint pain Additional Musculoskeletal Details: knee pain ; sees Neu rosurgeon for back  DDD lower back Musculoskeletal Management Strategies: Coping strategies, Routine screening Musculoskeletal Self-Management Outcome: 4 (good) Falls in the past year?: Yes Number of falls in past year: 2 or more Was there an injury with Fall?: Yes Fall Risk Category Calculator: 3 Patient Fall Risk Level: High Fall Risk Patient at Risk for Falls Due to: History of fall(s) Fall risk Follow up: Falls evaluation completed, Education provided, Falls prevention discussed  Psychosocial Psychosocial Symptoms Reported: No symptoms reported Behavioral Management Strategies: Coping  strategies Behavioral Health Self-Management Outcome: 4 (good) Major Change/Loss/Stressor/Fears (CP): Denies Techniques to Cope with Loss/Stress/Change: None Quality of Family Relationships: helpful, involved, supportive Do you feel physically threatened by others?: No    09/20/2024    PHQ2-9 Depression Screening   Little interest or pleasure in doing things Not at all  Feeling down, depressed, or hopeless Not at all  PHQ-2 - Total Score 0  Trouble falling or staying asleep, or sleeping too much    Feeling tired or having little energy    Poor appetite or overeating     Feeling bad about yourself - or that you are a failure or have let yourself or your family down    Trouble concentrating on things, such as reading the newspaper or watching television    Moving or speaking so slowly that other people could have noticed.  Or the opposite - being so fidgety or restless that you have been moving around a lot more than usual    Thoughts that you would be better off dead, or hurting yourself in some way    PHQ2-9 Total Score    If you checked off any problems, how difficult have these problems made it for you to do your work, take care of things at home, or get along with other people    Depression Interventions/Treatment      Today's Vitals   09/20/24 1047  Weight: 146 lb (66.2 kg)   Pain Scale: 0-10 Pain Score: 8  Pain Type: Chronic pain Pain Location: Knee Pain Orientation: Left Pain Descriptors / Indicators: Aching, Dull Pain Onset: On-going Patients  Stated Pain Goal: 0 Pain Intervention(s): Medication (See eMAR) (injection on 22nd)  Medications Reviewed Today     Reviewed by Kay Hendricks MATSU, RN (Case Manager) on 09/20/24 at 1030  Med List Status: <None>   Medication Order Taking? Sig Documenting Provider Last Dose Status Informant  acetaminophen  (TYLENOL ) 500 MG tablet [id number] Yes Take 500 mg by mouth every 8 (eight) hours as needed. [provider]  Active  Self, Pharmacy Records  albuterol  (PROVENTIL ) (2.5 MG/3ML) 0.083% nebulizer solution [id number] Yes Take 2.5 mg by nebulization every 4 (four) hours as needed for wheezing. [provider]  Active Self, Pharmacy Records  atorvastatin  (LIPITOR) 20 MG tablet [id number] Yes Take 20 mg by mouth at bedtime. [provider]  Active Self, Pharmacy Records  Azelastine  HCl 137 MCG/SPRAY SOLN [id number]  Place 1 spray into both nostrils daily.  Patient not taking: Reported on 09/20/2024   Iva Marty Saltness, MD  Consider Medication Status and Discontinue Self, Pharmacy Records  BD PEN NEEDLE NANO 2ND GEN 32G X 4 MM MISC [id number] Yes 1 each by Other route in the morning, at noon, in the evening, and at bedtime. Nida, Gebreselassie W, MD  Active Self, Pharmacy Records  bisoprolol  (ZEBETA ) 10 MG tablet [id number] Yes TAKE 1 TABLET BY MOUTH DAILY Branch, Dorn FALCON, MD  Active Self, Pharmacy Records  blood glucose meter kit and supplies [id number] Yes 1 each by Other route 4 (four) times daily. Dispense based on patient and insurance preference. Use up to four times daily as directed. (FOR ICD-10 E10.9, E11.9). Nida, Gebreselassie W, MD  Active Self, Pharmacy Records  budesonide -formoterol  (SYMBICORT ) 160-4.5 MCG/ACT inhaler [id number] Yes Inhale 2 puffs into the lungs 2 (two) times daily. [provider]  Active Self, Pharmacy Records  Cholecalciferol (VITAMIN D3) 125 MCG (5000 UT) CAPS [id number] Yes Take 1 capsule (5,000 Units total) by mouth daily. Nida, Gebreselassie W, MD  Active Self, Pharmacy Records  Continuous Glucose Sensor (FREESTYLE LIBRE 3 SENSOR) [place] [id number] Yes 1 Piece by Does not apply route every 14 (fourteen) days. Place 1 sensor on the skin every 14 days. Use to check glucose continuously Nida, Gebreselassie W, MD  Active Self, Pharmacy Records  diltiazem  (CARTIA  XT) 300 MG 24 hr capsule [id number] Yes Take 1 capsule (300 mg total) by mouth daily. Alvan Dorn FALCON, MD   Active Self, Pharmacy Records  divalproex  (DEPAKOTE  ER) 250 MG 24 hr tablet [id number]  Take 1 tablet (250 mg total) by mouth daily. Total of 750 mg in AM 1000 mg at night. Take along with 500 mg cap Hisada, Reina, MD  Active   divalproex  (DEPAKOTE  ER) 500 MG 24 hr tablet [id number] Yes Take 1 tablet (500 mg total) by mouth daily AND 2 tablets (1,000 mg total) at bedtime. Total of 750 mg in AM 1000 mg at night. Take along with 250 mg cap. Vickey Mettle, MD  Active   ELDERBERRY PO [id number] Yes Take 4 g by mouth daily. Gummies 2g each [provider]  Active Self, Pharmacy Records  ELIQUIS  5 MG TABS tablet [id number] Yes TAKE 1 TABLET BY MOUTH TWICE  DAILY Branch, Dorn FALCON, MD  Active Self, Pharmacy Records  EPINEPHrine  0.3 mg/0.3 mL IJ SOAJ injection [id number] Yes Inject 0.3 mg into the muscle once as needed for anaphylaxis. Cari Arlean HERO, FNP  Active Self, Pharmacy Records  EPINEPHrine  0.3 mg/0.3 mL IJ SOAJ injection [id number]  Inject 0.3 mg into the muscle as needed for anaphylaxis. As needed for life-threatening allergic  reactions  Patient not taking: Reported on 09/20/2024   Cari Arlean HERO, FNP  Consider Medication Status and Discontinue Self, Pharmacy Records  FERROUS SULFATE  PO [id number] Yes Take 65 mg by mouth every evening. [provider]  Active Self, Pharmacy Records  gabapentin  (NEURONTIN ) 600 MG tablet [id number] Yes Take 600 mg by mouth 3 (three) times daily. [provider]  Active Self, Pharmacy Records  Immune Globulin , Human, (CUVITRU  [place]) [id number] Yes Inject 27 mg into the skin every 14 (fourteen) days. [provider]  Active Self, Pharmacy Records  Insulin  Pen Needle (PEN NEEDLES 3/16) 31G X 5 MM MISC [id number] Yes Use as directed with insulin  pen Nida, Gebreselassie W, MD  Active Self, Pharmacy Records  JARDIANCE  10 MG TABS tablet [id number] Yes TAKE 1 TABLET BY MOUTH DAILY  BEFORE BREAKFAST Nida, Gebreselassie W, MD  Active Self, Pharmacy Records   levalbuterol  (XOPENEX  HFA) 45 MCG/ACT inhaler [id number] Yes USE 2 INHALATIONS BY MOUTH EVERY 6 HOURS AS NEEDED FOR WHEEZING Iva Marty Saltness, MD  Active   levalbuterol  (XOPENEX ) 1.25 MG/3ML nebulizer solution [id number] Yes Use 1 vial in the nebulizer every 6 hours as needed for cough, wheeze, shortness of breath or chest tightness. Iva Marty Saltness, MD  Active Self, Pharmacy Records  levothyroxine  (SYNTHROID , LEVOTHROID) 112 MCG tablet [id number] Yes Take 112 mcg by mouth daily before breakfast. [provider]  Active Self, Pharmacy Records  linaclotide  (LINZESS ) 145 MCG CAPS capsule [id number] Yes Take 145 mcg by mouth daily before breakfast. [provider]  Active Self, Pharmacy Records  LINZESS  290 MCG CAPS capsule [id number]  TAKE 1 CAPSULE BY MOUTH DAILY  BEFORE BREAKFAST  Patient not taking: Reported on 09/20/2024   Cindie Carlin POUR, DO  Active   losartan  (COZAAR ) 50 MG tablet [id number] Yes Take 50 mg by mouth every morning. [provider]  Active Self, Pharmacy Records  MELATONIN GUMMIES PO [id number] Yes Take 10 mg by mouth at bedtime. [provider]  Active Self, Pharmacy Records  metFORMIN  (GLUCOPHAGE ) 500 MG tablet [id number] Yes Take 500 mg by mouth 2 (two) times daily. [provider]  Active Self, Pharmacy Records  mineral oil-hydrophilic petrolatum (AQUAPHOR) ointment [id number] Yes Apply 1 Application topically daily as needed for dry skin. [provider]  Active Self, Pharmacy Records  montelukast  (SINGULAIR ) 10 MG tablet [id number] Yes TAKE 1 TABLET BY MOUTH AT  BEDTIME Iva Marty Saltness, MD  Active Self, Pharmacy Records  Multiple Vitamins-Minerals [facility] SKIN & NAILS PO) [id number] Yes Take 1 tablet by mouth daily. [provider]  Active Self, Pharmacy Records  Multiple Vitamins-Minerals (MULTIVITAMIN WITH MINERALS) tablet [id number]  Take 1 tablet by mouth daily. Woman  Patient not taking: Reported on  09/20/2024   [provider]  Consider Medication Status and Discontinue Self, Pharmacy Records  Nebulizer MISC [id number] Yes 1 Device by Other route as directed. Nebulizer tubing kit Iva Marty Saltness, MD  Active Self, Pharmacy Records  ondansetron  (ZOFRAN -ODT) 8 MG disintegrating tablet [id number] Yes Take 8 mg by mouth daily as needed for vomiting or nausea. [provider]  Active Self, Pharmacy Records  [facility] ULTRA test strip [id number] Yes 1 each by Other route in the morning, at noon, in the evening, and at bedtime. [provider]  Active Self, Pharmacy Records  Potassium Chloride  ER 20 MEQ TBCR [id number]  Take 20 mEq by mouth daily.  Patient not taking: Reported on 09/20/2024   [provider]  Active Self, Pharmacy Records  sertraline  (  ZOLOFT ) 25 MG tablet [id number] Yes Take 1 tablet (25 mg total) by mouth at bedtime. Vickey Mettle, MD  Active   Spacer/Aero-Holding Chambers DEVI [id number] Yes 1 Device by Does not apply route as directed. Iva Marty Saltness, MD  Active Self, Pharmacy Records  Tezepelumab -ekko (TEZSPIRE ) 210 MG/1. SOAJ [id number] Yes Inject 210 mg into the skin every 28 (twenty-eight) days. Cari Arlean HERO, FNP  Active Self, Pharmacy Records  tezepelumab -ekko (TEZSPIRE ) 210 MG/1. syringe 210 mg [id number]   Iva Marty Saltness, MD  Active   topiramate  (TOPAMAX ) 100 MG tablet [id number] Yes TAKE 2  TABLETS BY MOUTH  IN THE MORNING AND 1 TABLET IN  THE EVENING Dina Sara E, PA-C  Active Self, Pharmacy Records            Recommendation:   Referral to: BSW   Follow Up Plan:   Telephone follow-up 2 weeks  Hendricks Her RN, BSN  [place] I VBCI-Population Health RN Case Manager   Direct [phone]

## 2024-09-23 ENCOUNTER — Other Ambulatory Visit

## 2024-09-23 NOTE — Patient Instructions (Signed)
 Visit Information  Thank you for taking time to visit with me today. Please don't hesitate to contact me if I can be of assistance to you before our next scheduled appointment.  Your next care management appointment is by telephone on 09/24/24 at 3:30pm    Please call the care guide team at [phone] if you need to cancel, schedule, or reschedule an appointment.   Please call 911 if you are experiencing a Mental Health or Behavioral Health Crisis or need someone to talk to.  Tillman Gardener, BSW [place]  [facility], [facility] : [facility] Social Worker Direct Dial: [phone]  Fax: [phone] Website: [email or web address]

## 2024-09-23 NOTE — Patient Outreach (Signed)
 Visit Information  Thank you for taking time to visit with me today.   Tailored Plan Medicaid On April 10, 2023 some people on [place] Medicaid will move to a new kind of Medicaid health plan called a Tailored Plan. Tailored Plans cover your doctor visits, prescription drugs, and health care services.    If your [place] Medicaid will move to a Tailored Plan, you should have gotten a letter and welcome packet. If you're not sure, call your [place] Medicaid Enrollment Broker at [phone] and ask.  Check out these free materials, in Spanish and English, to learn more about your Tailored Plan: [email or web address]/Tailored-Plans/Toolkit  Tailored Care Management Services  TCM services are available to you now. If you are a Tailored Plan member or will be and want information about Tailored Care Management Services including rides to appointments and community and home services, call the Care Management provider for your county of residence:    [facility] [place], [place])  Member Services: [phone] Behavioral Health Crisis Line: [phone], [place], [place], [place], [place])  Member Services: [phone] Behavioral Health Crisis Line: [phone]     Please call 911 if you are experiencing a Mental Health or Behavioral Health Crisis or need someone to talk to.  SW t/c VAYA to opt into Tailored Plan Care Management.  Due to a keying error, staff agreed to call patient back regarding enrolling into the plan.  Patient reports no immediate crisis.  Tillman Gardener, BSW [place]  [facility], [facility] Social Worker Direct Dial: [phone]  Fax: [phone] Website: [email or web address]

## 2024-09-24 ENCOUNTER — Other Ambulatory Visit: Payer: Self-pay

## 2024-09-24 NOTE — Patient Outreach (Signed)
 Social Drivers of Health  Community Resource and Care Coordination Visit Note   09/24/2024  Name: Laura [name] MRN: [id number] DOB:[birth year]  Situation: Referral received for [facility] needs assessment and assistance related to Medicaid coverage. I obtained verbal consent from Patient.  Visit completed with Patient on the phone.   Background:   SDOH Interventions Today    Flowsheet Row Most Recent Value  SDOH Interventions   Food Insecurity Interventions --  [Patients foodstamps reduced. Patient declined food bank list.]  Financial Strain Interventions Other (Comment)  [[employer] reports Medicaid terminated 08/09/24.Patient reports having partial Medicaid but [employer] does not show coverage.T/c DSS but no answer. Pt to follow up with DSS by 09/26/24]     Assessment:   Goals Addressed             This Visit's Progress    BSW Goals       Current SDOH Barriers:  Medicaid coverage  Interventions: Patient interviewed and appropriate screenings performed Referred patient to community resources  Provided patient with information about types of Medicaid coverage Discussed plans with patient for ongoing follow up and provided patient with direct contact number Advised patient to contact DSS to confirm type of Medicaid coverage Assisted patient/caregiver with obtaining information about health plan benefits SW t/c [employer] and was informed patient Medicaid terminated. Patient reports she does not want food bank list.            Recommendation:   Follow up with Medicaid regarding benefit coverage.  Follow Up Plan:   Telephone follow up appointment date/time:  09/26/24 at 1oam  Tillman Gardener, BSW [place]  [facility], [facility] Social Worker Direct Dial: [phone]  Fax: [phone] Website: [email or web address]

## 2024-09-24 NOTE — Patient Instructions (Signed)
 Visit Information  Thank you for taking time to visit with me today. Please don't hesitate to contact me if I can be of assistance to you before our next scheduled appointment.  Your next care management appointment is by telephone on 09/26/24 at 10am   Please call the care guide team at [phone] if you need to cancel, schedule, or reschedule an appointment.   Please call 911 if you are experiencing a Mental Health or Behavioral Health Crisis or need someone to talk to.  Tillman Gardener, BSW [place]  [facility], [facility] Social Worker Direct Dial: [phone]  Fax: [phone] Website: [email or web address]

## 2024-09-25 ENCOUNTER — Ambulatory Visit: Admitting: General Surgery

## 2024-09-25 ENCOUNTER — Other Ambulatory Visit (HOSPITAL_COMMUNITY)
Admission: RE | Admit: 2024-09-25 | Discharge: 2024-09-25 | Disposition: A | Discharge status: Home / Self Care | Source: Ambulatory Visit | Attending: Psychiatry | Admitting: Psychiatry

## 2024-09-25 ENCOUNTER — Ambulatory Visit: Payer: Self-pay | Admitting: Psychiatry

## 2024-09-25 ENCOUNTER — Encounter: Payer: Self-pay | Admitting: General Surgery

## 2024-09-25 VITALS — BP 180/72 | HR 64 | Temp 98.6°F | Resp 18 | Ht 64.0 in | Wt 149.0 lb

## 2024-09-25 DIAGNOSIS — T8130XA Disruption of wound, unspecified, initial encounter: Secondary | ICD-10-CM

## 2024-09-25 DIAGNOSIS — Z79899 Other long term (current) drug therapy: Secondary | ICD-10-CM | POA: Insufficient documentation

## 2024-09-25 DIAGNOSIS — T148XXA Other injury of unspecified body region, initial encounter: Secondary | ICD-10-CM

## 2024-09-25 LAB — VALPROIC ACID LEVEL: Valproic Acid Lvl: 42 ug/mL — ABNORMAL LOW (ref 50–100)

## 2024-09-25 MED ORDER — TRAMADOL HCL 50 MG PO TABS: 50.0000 mg | ORAL_TABLET | Freq: Four times a day (QID) | ORAL | 0 refills | Status: AC | PRN

## 2024-09-25 NOTE — Progress Notes (Unsigned)
 [facility]  Doing well no issues. Some swelling in the left groin.  BP (!) 151/93   Pulse 61   Temp 98 F (36.7 C) (Oral)   Resp 14   Ht 5\' 8"  (1.727 m)   Wt 172 lb (78 kg)   SpO2 92%   BMI 26.15 kg/m  Port sites healing no erythema or drainage, minor swelling in the left groin  Patient s/p robotic assisted inguinal hernia repair with mesh on the left. Doing well.   Start back tennis slowly.  The swelling should improve with time.  If you want referral to an Orthopedic physician for the bicep tendon let us know.   Algis Greenhouse, MD [facility] [address] Vella Raring [place], [place] [address] [phone] (office)

## 2024-09-25 NOTE — Patient Instructions (Addendum)
 Call with issues. Area is healing and this will improve with time (hematoma is like a bad bruise deep in the muscle and healing takes time).  Keep covered as needed.  Tramadol  sent in for pain that tylenol  is not covering since you cannot take NSAIDs.  Follow up as needed.

## 2024-09-25 NOTE — Progress Notes (Signed)
 Her Depakote  level is below the therapeutic range; however, please  advise her to remain on the current dose as she has been doing well.

## 2024-09-26 ENCOUNTER — Other Ambulatory Visit: Payer: Self-pay

## 2024-09-26 NOTE — Progress Notes (Signed)
 Lvm to call office back

## 2024-09-26 NOTE — Patient Instructions (Signed)

## 2024-09-26 NOTE — Patient Outreach (Signed)
 Social Drivers of Health  Community Resource and Care Coordination Visit Note   09/26/2024  Name: Rosie Torrez MRN: [id number] DOB:[birth year]  Situation: Referral received for [facility] needs assessment and assistance related to Medicaid coverage. I obtained verbal consent from Patient.  Visit completed with Patient on the phone.   Background:   SDOH Interventions Today    Flowsheet Row Most Recent Value  SDOH Interventions   Food Insecurity Interventions Other (Comment)  [Pt to follow up with Foodstamps on increasing benefit amount]     Assessment:   Goals Addressed             This Visit's Progress    COMPLETED: BSW Goals       Current SDOH Barriers:  Medicaid coverage  Interventions: Patient interviewed and appropriate screenings performed Provided patient with information about types of Medicaid coverage Advised patient to contact DSS to review foodstamp allotment Patient spoke to [facility] and confirmed she has MQB for Medicare premium only and does not have billable Medicaid. Patient has no other needs          Recommendation:   call and/or follow up with Foodstamps for food assistance  Follow Up Plan:   Patient has achieved all patient stated goals. [employer] will be closed. Patient has been provided contact information should new needs arise.   Tillman Gardener, BSW [place]  [facility], [facility] Social Worker Direct Dial: [phone]  Fax: [phone] Website: [email or web address]

## 2024-10-01 ENCOUNTER — Ambulatory Visit: Payer: Self-pay | Admitting: Internal Medicine

## 2024-10-02 ENCOUNTER — Ambulatory Visit (HOSPITAL_COMMUNITY): Admitting: Clinical

## 2024-10-02 DIAGNOSIS — F4310 Post-traumatic stress disorder, unspecified: Secondary | ICD-10-CM | POA: Diagnosis not present

## 2024-10-02 NOTE — Progress Notes (Signed)
 IN PERSON    I connected with Laura [name] on 10/02/24 at  9:00 AM EDT in person and verified that I am speaking with the correct person using two identifiers.   Location: Patient: office Provider: office   I discussed the limitations of evaluation and management by telemedicine and the availability of in person appointments. The patient expressed understanding and agreed to proceed. (IN PERSON)     THERAPIST PROGRESS NOTE   Session Time: 9:00 AM-:9:55 AM   Participation Level: Active   Behavioral Response: CasualAlertDepressed   Type of Therapy: Individual Therapy   Treatment Goals addressed: Coping/ non-medication treatment therapy for PTSD.   Interventions: CBT, DBT, Solution Focused, Strength-based and Supportive   Summary: Laura [name] is a 63 y.o. female who presents with PTSD. The OPT therapist worked with the patient for her ongoing OPT treatment session. The OPT therapist utilized Motivational Interviewing to assist in creating therapeutic repore.The patient in the session was engaged and work in collaboration giving feedback about her triggers and symptoms over the past few weeks.  The patient spoke about a at home fall and  general soreness and ongoing difficulty with her knee which has been evaluated and will be re-evaluated later today with indication of setting a appointment for a knee replacement procedure in 2026.The OPT therapist utilized Cognitive Behavioral Therapy through cognitive restructuring as well as worked with the patient on coping strategies to assist in management of mental health symptoms. The OPT therapist worked with the patient looking at interactions with family during the past few weeks and leading into the upcoming Christmas holidays.The patient has been utilizing her coping skills for Winter and her sister as a support person helping her with yard work and getting out of the home utilizing change of environment. The OPT therapist examined with the  patient patterns of interaction that have been historically triggering for the patient and worked with the patient on management of her interaction and the impact of interactions with key individuals in her family. Recently the pateint was able to watch her grandchildren from her daughter who lives in [place] for a few days and this went well and was a mood booster.The OPT therapist placed emphasis on the patient keeping all upcoming health appointments and adhering to ongoing health treatment recommendations.The patient spoke about her goal of getting to baseline and not being triggered by family interactions into regressing from pre-existing trauma. The OPT therapist reviewed all appointments listed coming up in the patients MyChart    Suicidal/Homicidal: Nowithout intent/plan   Therapist Response:The OPT therapist worked with the patient for the patients scheduled session. The patient was engaged in her session and gave feedback in relation to triggers, symptoms, and behavior responses over the past few weeks. The patient spoke about her upcoming appointment later today for further examination by medical staff for her knee with indication post prior ex-rays of need for a knee replacement . The OPT therapist worked with the patient utilizing an in session Cognitive Behavioral Therapy exercise. The OPT therapist worked with the patient allowing a safe place to  talk about current stressors. The patient has continued to work with her physical health providers to manage physical health conditions. The patient spoke about her mood and interactions with family recently  The OPT therapist worked with the patient on not getting pulled into triangulation.The patient spoke about recent positive interactions and  spending time with family members and watching her grandchildren for a few days recently for her daughter. The  OP therapist overviewed elements of basic needs and self care in the session.The OPT therapist will  continue treatment work with the patient in her next scheduled session.   Plan: Return again in 2/3 weeks.   Diagnosis:      Axis I: Post Traumatic Stress Disorder                            Axis II: No diagnosis     Collaboration of Care: Overview with the patient of involvement in the Med Management program with Dr. Vickey.   Patient/Guardian was advised Release of Information must be obtained prior to any record release in order to collaborate their care with an outside provider. Patient/Guardian was advised if they have not already done so to contact the registration department to sign all necessary forms in order for us  to release information regarding their care.    Consent: Patient/Guardian gives verbal consent for treatment and assignment of benefits for services provided during this visit. Patient/Guardian expressed understanding and agreed to proceed.      I discussed the assessment and treatment plan with the patient. The patient was provided an opportunity to ask questions and all were answered. The patient agreed with the plan and demonstrated an understanding of the instructions.   The patient was advised to call back or seek an in-person evaluation if the symptoms worsen or if the condition fails to improve as anticipated.   I provided 55 minutes of face-to-face time during this encounter.   Jerel Pepper, LCSW    10/02/2024

## 2024-10-08 ENCOUNTER — Other Ambulatory Visit: Payer: Self-pay

## 2024-10-08 NOTE — Telephone Encounter (Signed)
 Sent to Dr Cindie in secure chat

## 2024-10-08 NOTE — Patient Instructions (Signed)
 Visit Information  Ms. Demby was given information about Medicaid Managed Care team care coordination services as a part of their [facility] Community Plan Medicaid benefit.   If you would like to schedule transportation through your [facility], please call the following number at least 2 days in advance of your appointment: [phone]   Rides for urgent appointments can also be made after hours by calling Member Services.  Call the Behavioral Health Crisis Line at [phone], at any time, 24 hours a day, 7 days a week. If you are in danger or need immediate medical attention call 911.  Please see education materials related to diabetes provided by MyChart link.  Care plan and visit instructions communicated with the patient verbally today. Patient agrees to receive a copy in MyChart. Active MyChart status and patient understanding of how to access instructions and care plan via MyChart confirmed with patient.     Telephone follow up appointment with Managed Medicaid care management team member scheduled for:11-09-2023 11:00 AM   Hendricks Her RN, BSN  [place] VBCI-Population Health RN Case Manager   Direct [phone]   Following is a copy of your plan of care:   Goals Addressed             This Visit's Progress    VBCI RN Care Plan       Problems:  Chronic Disease Management support and education needs related to DMII  Goal: Over the next 90 days the Patient will attend all scheduled medical appointments: with providers as evidenced by encounter notes in EPIC        continue to work with RN Care Manager and/or Social Worker to address care management and care coordination needs related to DMII as evidenced by adherence to care management team scheduled appointments     demonstrate ongoing self health care management ability of diabetes as evidenced by recording blood sugar readings and following a diabetic diet    Update 10/08/2024 :  Patient will begin to keep log on a regular basis - DAILY FSBS  take all medications exactly as prescribed and will call provider for medication related questions as evidenced by communication with Provider regarding any medication questions or concerns      Interventions:   Diabetes Interventions: Assessed patient's understanding of A1c goal: <6.5% Provided education to patient about basic DM disease process Reviewed medications with patient and discussed importance of medication adherence Discussed plans with patient for ongoing care management follow up and provided patient with direct contact information for care management team Referral made to social work team for assistance with financial stress and returning to work force  Review of patient status, including review of consultants reports, relevant laboratory and other test results, and medications completed Screening for signs and symptoms of depression related to chronic disease state  Assessed social determinant of health barriers Lab Results  Component Value Date   HGBA1C 6.3 07/23/2024    Patient Self-Care Activities:  Attend all scheduled provider appointments Call pharmacy for medication refills 3-7 days in advance of running out of medications Call provider office for new concerns or questions  Notify RN Care Manager of TOC call rescheduling needs schedule appointment with eye doctor check feet daily for cuts, sores or redness enter blood sugar readings and medication or insulin  into daily log take the blood sugar log to all doctor visits drink 6 to 8 glasses of water each day eat fish at least once per week set  a realistic goal  Plan:  Telephone follow up appointment with care management team member scheduled for:  11-08-2024 at 10:00 AM

## 2024-10-08 NOTE — Patient Outreach (Signed)
 Complex Care Management   Visit Note  10/08/2024  Name:  Laura [name] MRN: [id number] DOB: Aug 30, 1961  Situation: Referral received for Complex Care Management related to Diabetes with Complications I obtained verbal consent from Patient.  Visit completed with Patient  on the phone  Background:   Past Medical History:  Diagnosis Date   Anemia    Aortic atherosclerosis    Asthma    Atrial fibrillation and flutter (HCC)    a.) CHA2DS2VASc = 6 (sex, HTN, TIA x2, vascular disease history, T2DM);  b.) DCCV ~ 2009; c.) DCCV 08/29/2018 (200J x1 --> SR with 1st degree AVB); d.) rate/rhythm maintained on oral diltiazem  + bisoprolol ; chronically anticoagulated with apixaban    Bipolar disorder (HCC)    Carpal tunnel syndrome of right wrist    CHB (complete heart block) (HCC)    a.) s/p St. Jude dual chamber PPM placement   CHF (congestive heart failure) (HCC)    Complication of anesthesia    a.) allergic reaction to perioperative analgesics + anesthetic medications in [place] ; unsure of combination; not able to recount reactions/events --> states I ended up in the ICU for 5 days   Constipation    a.) on linaclotide    COPD (chronic obstructive pulmonary disease) (HCC)    DDD (degenerative disc disease), thoracic    GERD (gastroesophageal reflux disease)    H/O congenital atrial septal defect (ASD) repair    Hallux valgus of right foot    Hammertoe of second toe of right foot    History of 2019 novel coronavirus disease (COVID-19)    a.) 10/2020; b.) 05/2021   HLD (hyperlipidemia)    HTN (hypertension)    Hypogammaglobulinemia    a.) on Curitru   Hypothyroid    IgA deficiency (HCC)    IgE deficiency    Long term current use of anticoagulant    a.) apixaban    Lumbar stenosis    Migraines    Mitral stenosis 02/08/2022   a.) TTE 02/08/2022: EF 60-65%, mild MAC, mild MV stenosis (peak grad 14.6 / mean grad 4.0)   Neuropathy    NSVT (nonsustained ventricular tachycardia) (HCC)  10/31/2018   a.) Holter 10/31/2018: 6 beat run   Obesity    OSA on CPAP    Osteoarthritis    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 Jude dual chamber device; placed for symptomatic bradycardia secondary to intermittent CHB   PTSD (post-traumatic stress disorder)    Pulmonary nodules    Recurrent sinusitis    Short-term memory loss    Sleep difficulties    a.) takes melatonin   Symptomatic bradycardia    a.) s/p St. Jude dual chamber PPM placement   Syncope    TIA (transient ischemic attack)    x3-last one in 2015   Tremors of nervous system    Type 2 diabetes mellitus (HCC)     Assessment: Patient Reported Symptoms:  Cognitive Cognitive Status: Alert and oriented to person, place, and time Cognitive/Intellectual Conditions Management [RPT]: None reported or documented in medical history or problem list   Health Maintenance Behaviors: Annual physical exam, Healthy diet Healing Pattern: Slow Health Facilitated by: Rest, Healthy diet  Neurological Neurological Review of Symptoms: Numbness Neurological Management Strategies: Coping strategies, Medication therapy, Routine screening Neurological Comment: Numbness in tips of fingers; left knee numbness  Seeing Dr for knee on 12/31.  HEENT HEENT Symptoms Reported: Change or loss of hearing (New hearing aids before Christmas) HEENT Management Strategies: Adequate rest, Coping  strategies, Routine screening HEENT Self-Management Outcome: 4 (good)    Cardiovascular Cardiovascular Symptoms Reported: Palpitations (A few palpitations ) Does patient have uncontrolled Hypertension?: No Cardiovascular Management Strategies: Adequate rest, Routine screening Weight: 142 lb (64.4 kg) (Dr scale said 149 on 12/17) Cardiovascular Self-Management Outcome: 4 (good)  Respiratory Respiratory Symptoms Reported: Shortness of breath Additional Respiratory Details: States Dr told her she will always have SOB- Respiratory  Management Strategies: Adequate rest, Routine screening Respiratory Self-Management Outcome: 4 (good)  Endocrine Endocrine Symptoms Reported: No symptoms reported Is patient diabetic?: Yes Is patient checking blood sugars at home?: Yes List most recent blood sugar readings, include date and time of day: 10/08/2024 fasting 225  had brownies Endocrine Self-Management Outcome: 3 (uncertain) Endocrine Comment: Holidays  Gastrointestinal Gastrointestinal Symptoms Reported: Diarrhea Gastrointestinal Management Strategies: Medication therapy, Adequate rest Gastrointestinal Self-Management Outcome: 4 (good)    Genitourinary Genitourinary Symptoms Reported: No symptoms reported Additional Genitourinary Details: light pink tinged toilet paper  after urination Genitourinary Management Strategies: Adequate rest, Coping strategies Genitourinary Self-Management Outcome: 4 (good)  Integumentary Integumentary Symptoms Reported: Wound Additional Integumentary Details: wound still on  left butt cheek Skin Management Strategies: Adequate rest, Coping strategies Skin Self-Management Outcome: 4 (good)  Musculoskeletal Musculoskelatal Symptoms Reviewed: Joint pain Additional Musculoskeletal Details: left knee Musculoskeletal Management Strategies: Coping strategies, Routine screening Musculoskeletal Self-Management Outcome: 4 (good) Falls in the past year?: Yes Number of falls in past year: 1 or less Was there an injury with Fall?: No Fall Risk Category Calculator: 1 Patient Fall Risk Level: Low Fall Risk Patient at Risk for Falls Due to: History of fall(s) Fall risk Follow up: Falls evaluation completed, Education provided, Falls prevention discussed  Psychosocial Psychosocial Symptoms Reported: No symptoms reported Behavioral Management Strategies: Coping strategies Behavioral Health Self-Management Outcome: 4 (good) Major Change/Loss/Stressor/Fears (CP): Denies Techniques to Cope with  Loss/Stress/Change: None Quality of Family Relationships: helpful, involved, supportive Do you feel physically threatened by others?: No    10/08/2024    PHQ2-9 Depression Screening   Little interest or pleasure in doing things Not at all  Feeling down, depressed, or hopeless Not at all  PHQ-2 - Total Score 0  Trouble falling or staying asleep, or sleeping too much    Feeling tired or having little energy    Poor appetite or overeating     Feeling bad about yourself - or that you are a failure or have let yourself or your family down    Trouble concentrating on things, such as reading the newspaper or watching television    Moving or speaking so slowly that other people could have noticed.  Or the opposite - being so fidgety or restless that you have been moving around a lot more than usual    Thoughts that you would be better off dead, or hurting yourself in some way    PHQ2-9 Total Score    If you checked off any problems, how difficult have these problems made it for you to do your work, take care of things at home, or get along with other people    Depression Interventions/Treatment      Today's Vitals   10/08/24 1001  BP: (!) 186/84  Weight:    Pain Scale: 0-10 Pain Score: 10-Worst pain ever Pain Location: Knee Pain Orientation: Left Pain Descriptors / Indicators: Aching, Dull Pain Onset: On-going Patients Stated Pain Goal: 0 Pain Intervention(s): Medication (See eMAR) Multiple Pain Sites: Yes  Medications Reviewed Today     Reviewed by Kay Hendricks MATSU,  RN (Case Manager) on 10/08/24 at 0950  Med List Status: <None>   Medication Order Taking? Sig Documenting Provider Last Dose Status Informant  acetaminophen  (TYLENOL ) 500 MG tablet [id number] Yes Take 500 mg by mouth every 8 (eight) hours as needed. [provider]  Active Self, Pharmacy Records  albuterol  (PROVENTIL ) (2.5 MG/3ML) 0.083% nebulizer solution [id number] Yes Take 2.5 mg by nebulization every 4 (four)  hours as needed for wheezing. [provider]  Active Self, Pharmacy Records  atorvastatin  (LIPITOR) 20 MG tablet [id number] Yes Take 20 mg by mouth at bedtime. [provider]  Active Self, Pharmacy Records  Azelastine  HCl 137 MCG/SPRAY SOLN [id number]  Place 1 spray into both nostrils daily.  Patient not taking: Reported on 10/08/2024   Iva Marty Saltness, MD  Consider Medication Status and Discontinue Self, Pharmacy Records  BD PEN NEEDLE NANO 2ND GEN 32G X 4 MM MISC [id number] Yes 1 each by Other route in the morning, at noon, in the evening, and at bedtime. Nida, Gebreselassie W, MD  Active Self, Pharmacy Records  bisoprolol  (ZEBETA ) 10 MG tablet [id number] Yes TAKE 1 TABLET BY MOUTH DAILY Branch, Dorn FALCON, MD  Active Self, Pharmacy Records  blood glucose meter kit and supplies [id number] Yes 1 each by Other route 4 (four) times daily. Dispense based on patient and insurance preference. Use up to four times daily as directed. (FOR ICD-10 E10.9, E11.9). Nida, Gebreselassie W, MD  Active Self, Pharmacy Records  budesonide -formoterol  (SYMBICORT ) 160-4.5 MCG/ACT inhaler [id number] Yes Inhale 2 puffs into the lungs 2 (two) times daily. [provider]  Active Self, Pharmacy Records  Cholecalciferol (VITAMIN D3) 125 MCG (5000 UT) CAPS [id number] Yes Take 1 capsule (5,000 Units total) by mouth daily. Nida, Gebreselassie W, MD  Active Self, Pharmacy Records  Continuous Glucose Sensor (FREESTYLE LIBRE 3 SENSOR) [place] [id number] Yes 1 Piece by Does not apply route every 14 (fourteen) days. Place 1 sensor on the skin every 14 days. Use to check glucose continuously Nida, Gebreselassie W, MD  Active Self, Pharmacy Records  diltiazem  (CARTIA  XT) 300 MG 24 hr capsule [id number] Yes Take 1 capsule (300 mg total) by mouth daily. Alvan Dorn FALCON, MD  Active Self, Pharmacy Records  divalproex  (DEPAKOTE  ER) 250 MG 24 hr tablet [id number] Yes Take 1 tablet (250 mg total) by mouth daily. Total of  750 mg in AM 1000 mg at night. Take along with 500 mg cap Hisada, Reina, MD  Active   divalproex  (DEPAKOTE  ER) 500 MG 24 hr tablet [id number] Yes Take 1 tablet (500 mg total) by mouth daily AND 2 tablets (1,000 mg total) at bedtime. Total of 750 mg in AM 1000 mg at night. Take along with 250 mg cap. Vickey Mettle, MD  Active   ELDERBERRY PO [id number] Yes Take 4 g by mouth daily. Gummies 2g each [provider]  Active Self, Pharmacy Records  ELIQUIS  5 MG TABS tablet [id number] Yes TAKE 1 TABLET BY MOUTH TWICE  DAILY Branch, Dorn FALCON, MD  Active Self, Pharmacy Records  EPINEPHrine  0.3 mg/0.3 mL IJ SOAJ injection [id number] Yes Inject 0.3 mg into the muscle once as needed for anaphylaxis. Cari Arlean HERO, FNP  Active Self, Pharmacy Records  FERROUS SULFATE  PO [id number] Yes Take 65 mg by mouth every evening. [provider]  Active Self, Pharmacy Records  gabapentin  (NEURONTIN ) 600 MG tablet [id number] Yes Take 600 mg by mouth 3 (three) times daily. [provider]  Active Self, Pharmacy Records  Immune Globulin , Human, (  CUVITRU  [place]) [id number] Yes Inject 27 mg into the skin every 14 (fourteen) days. [provider]  Active Self, Pharmacy Records  Insulin  Pen Needle (PEN NEEDLES 3/16) 31G X 5 MM MISC [id number] Yes Use as directed with insulin  pen Nida, Gebreselassie W, MD  Active Self, Pharmacy Records  JARDIANCE  10 MG TABS tablet [id number] Yes TAKE 1 TABLET BY MOUTH DAILY  BEFORE BREAKFAST Nida, Gebreselassie W, MD  Active Self, Pharmacy Records  levalbuterol  (XOPENEX  HFA) 45 MCG/ACT inhaler [id number] Yes USE 2 INHALATIONS BY MOUTH EVERY 6 HOURS AS NEEDED FOR WHEEZING Iva Marty Saltness, MD  Active   levalbuterol  (XOPENEX ) 1.25 MG/3ML nebulizer solution [id number] Yes Use 1 vial in the nebulizer every 6 hours as needed for cough, wheeze, shortness of breath or chest tightness. Iva Marty Saltness, MD  Active Self, Pharmacy Records  levothyroxine  (SYNTHROID , LEVOTHROID) 112  MCG tablet [id number] Yes Take 112 mcg by mouth daily before breakfast. [provider]  Active Self, Pharmacy Records  linaclotide  (LINZESS ) 145 MCG CAPS capsule [id number] Yes Take 145 mcg by mouth daily before breakfast. [provider]  Active Self, Pharmacy Records  losartan  (COZAAR ) 50 MG tablet [id number] Yes Take 50 mg by mouth every morning. [provider]  Active Self, Pharmacy Records  MELATONIN GUMMIES PO [id number] Yes Take 10 mg by mouth at bedtime. [provider]  Active Self, Pharmacy Records  metFORMIN  (GLUCOPHAGE ) 500 MG tablet [id number] Yes Take 500 mg by mouth 2 (two) times daily. [provider]  Active Self, Pharmacy Records  mineral oil-hydrophilic petrolatum (AQUAPHOR) ointment [id number] Yes Apply 1 Application topically daily as needed for dry skin. [provider]  Active Self, Pharmacy Records  montelukast  (SINGULAIR ) 10 MG tablet [id number] Yes TAKE 1 TABLET BY MOUTH AT  BEDTIME Iva Marty Saltness, MD  Active Self, Pharmacy Records  Multiple Vitamins-Minerals [facility] SKIN & NAILS PO) [id number] Yes Take 1 tablet by mouth daily. [provider]  Active Self, Pharmacy Records  Multiple Vitamins-Minerals (MULTIVITAMIN WITH MINERALS) tablet [id number] Yes Take 1 tablet by mouth daily. Woman [provider]  Active Self, Pharmacy Records  Nebulizer MISC [id number] Yes 1 Device by Other route as directed. Nebulizer tubing kit Iva Marty Saltness, MD  Active Self, Pharmacy Records  ondansetron  (ZOFRAN -ODT) 8 MG disintegrating tablet [id number] Yes Take 8 mg by mouth daily as needed for vomiting or nausea. [provider]  Active Self, Pharmacy Records  [facility] ULTRA test strip [id number] Yes 1 each by Other route in the morning, at noon, in the evening, and at bedtime. [provider]  Active Self, Pharmacy Records  Potassium Chloride  ER 20 MEQ TBCR [id number]  Take 20 mEq by mouth daily.  Patient  not taking: Reported on 10/08/2024   [provider]  Consider Medication Status and Discontinue Self, Pharmacy Records  sertraline  (ZOLOFT ) 25 MG tablet [id number] Yes Take 1 tablet (25 mg total) by mouth at bedtime. Vickey Mettle, MD  Active   Spacer/Aero-Holding Chambers DEVI [id number] Yes 1 Device by Does not apply route as directed. Iva Marty Saltness, MD  Active Self, Pharmacy Records  Tezepelumab -ekko (TEZSPIRE ) 210 MG/1. SOAJ [id number] Yes Inject 210 mg into the skin every 28 (twenty-eight) days. Cari Arlean HERO, FNP  Active Self, Pharmacy Records  tezepelumab -ekko (TEZSPIRE ) 210 MG/1. syringe 210 mg [id number]   Iva Marty Saltness, MD  Active   topiramate  (TOPAMAX ) 100 MG tablet [id number] Yes TAKE 2  TABLETS BY MOUTH  IN THE MORNING AND 1 TABLET IN  THE  EVENING Wertman, Sara E, PA-C  Active Self, Pharmacy Records  traMADol  (ULTRAM ) 50 MG tablet [id number] Yes Take 1 tablet (50 mg total) by mouth every 6 (six) hours as needed for severe pain (pain score 7-10). Kallie [name] BROCKS, MD  Active             Recommendation:   Continue Current Plan of Care  Follow Up Plan:   Telephone follow-up in 1 month  Hendricks Her RN, BSN  [place] [facility] RN Case Manager   Direct [phone]

## 2024-10-09 ENCOUNTER — Other Ambulatory Visit: Payer: Self-pay | Admitting: Orthopedic Surgery

## 2024-10-09 DIAGNOSIS — M25562 Pain in left knee: Secondary | ICD-10-CM

## 2024-10-09 DIAGNOSIS — M25561 Pain in right knee: Secondary | ICD-10-CM

## 2024-10-09 DIAGNOSIS — G8929 Other chronic pain: Secondary | ICD-10-CM

## 2024-10-14 ENCOUNTER — Other Ambulatory Visit: Payer: Self-pay | Admitting: Allergy & Immunology

## 2024-10-14 ENCOUNTER — Other Ambulatory Visit: Payer: Self-pay | Admitting: Family Medicine

## 2024-10-14 ENCOUNTER — Other Ambulatory Visit: Payer: Self-pay | Admitting: Psychiatry

## 2024-10-14 ENCOUNTER — Other Ambulatory Visit: Payer: Self-pay | Admitting: Internal Medicine

## 2024-10-14 DIAGNOSIS — E1165 Type 2 diabetes mellitus with hyperglycemia: Secondary | ICD-10-CM

## 2024-10-14 DIAGNOSIS — F4310 Post-traumatic stress disorder, unspecified: Secondary | ICD-10-CM

## 2024-10-16 ENCOUNTER — Other Ambulatory Visit: Payer: Self-pay | Admitting: Orthopedic Surgery

## 2024-10-16 DIAGNOSIS — G8929 Other chronic pain: Secondary | ICD-10-CM

## 2024-10-16 DIAGNOSIS — M25562 Pain in left knee: Secondary | ICD-10-CM

## 2024-10-16 DIAGNOSIS — M25561 Pain in right knee: Secondary | ICD-10-CM

## 2024-10-24 NOTE — Telephone Encounter (Signed)
 From secure chat Dr Cindie:  Carmina and Baruch levels are negative. Her Beef level is equivocal/low. I doubt this is contributing to her symptoms as I typically see much higher levels in patients with Alpha Gal. Would recommend going off of Trulicity  for 6-8 weeks to see if this helps with her symptoms (if she hasn't already). Follow up as previously scheduled. Thank you  9 mins CC

## 2024-10-24 NOTE — Telephone Encounter (Signed)
 noted

## 2024-10-24 NOTE — Telephone Encounter (Signed)
 Sent message to Dr Cindie in secure chat  Pt was seen 08/29/2024 and she is to return in 3 months. Pt has an appt with Sonny Kerns on 02/23. Waiting on a response from Dr Cindie.

## 2024-10-24 NOTE — Telephone Encounter (Signed)
 Phoned the pt and advised of the instructions of her eating and medications. Pt states she has done both and she is still having her same symptoms. Pt was advised of upcoming appt with Sonny kerns and pt in no way will see Sonny Kerns. She advised me that Dr Cindie knows this as well as he front staff. I advised the pt that I will send a note to the front desk for her appt to be rescheduled with Dr Cindie. Pt was agreeable with that regardless of his schedule.  [place] please reschedule pt to see Dr Cindie.

## 2024-10-28 ENCOUNTER — Telehealth: Payer: Self-pay | Admitting: "Endocrinology

## 2024-10-28 NOTE — Telephone Encounter (Signed)
 error

## 2024-10-29 ENCOUNTER — Ambulatory Visit (HOSPITAL_COMMUNITY): Admitting: Clinical

## 2024-10-29 DIAGNOSIS — F4310 Post-traumatic stress disorder, unspecified: Secondary | ICD-10-CM

## 2024-10-29 NOTE — Progress Notes (Addendum)
 Virtual Visit via Telephone Note  I connected with Laura [name] on 10/29/24 at 10:00 AM EST by telephone and verified that I am speaking with the correct person using two identifiers.  Location: Patient: home Provider: office   I discussed the limitations, risks, security and privacy concerns of performing an evaluation and management service by telephone and the availability of in person appointments. I also discussed with the patient that there may be a patient responsible charge related to this service. The patient expressed understanding and agreed to proceed.      THERAPIST PROGRESS NOTE   Session Time: 10:00 AM-:10:45 AM   Participation Level: Active   Behavioral Response: CasualAlertDepressed   Type of Therapy: Individual Therapy   Treatment Goals addressed: Coping/ non-medication treatment therapy for PTSD.   Interventions: CBT, DBT, Solution Focused, Strength-based and Supportive   Summary: Laura [name] is a 64 y.o. female who presents with PTSD. The OPT therapist worked with the patient for her ongoing OPT treatment session. The OPT therapist utilized Motivational Interviewing to assist in creating therapeutic repore.The patient in the session was engaged and work in collaboration giving feedback about her triggers and symptoms over the past few weeks.  The patient spoke about taking a new medication to help with pain and difficulty with her knee the patient will have further imaging for her knee in February to evaluate further her knee damage and if the patient will need further treatment.The OPT therapist utilized Cognitive Behavioral Therapy through cognitive restructuring as well as worked with the patient on coping strategies to assist in management of mental health symptoms. The OPT therapist worked with the patient looking at interactions with family during the past few weeks through the holidays and into the new year.The patient has been utilizing her coping skills for  Winter. The OPT therapist examined with the patient patterns of interaction that have been historically triggering for the patient and worked with the patient on management of her interaction and the impact of interactions with key individuals in her family. The patient has been using a cain to help her with mobility and walking due to her knee injury..The OPT therapist placed emphasis on the patient keeping all upcoming health appointments and adhering to ongoing health treatment recommendations. The OPT therapist reviewed all appointments listed coming up in the patients MyChart    Suicidal/Homicidal: Nowithout intent/plan   Therapist Response:The OPT therapist worked with the patient for the patients scheduled session. The patient was engaged in her session and gave feedback in relation to triggers, symptoms, and behavior responses over the past few weeks. The patient spoke about ongoing involvement with health providers in relation to her knee injury post falling off her porch before Christmas holiday . The OPT therapist worked with the patient utilizing an in session Cognitive Behavioral Therapy exercise. The OPT therapist worked with the patient allowing a safe place to  talk about current stressors. The patient has continued to work with her physical health providers to manage physical health conditions. The patient spoke about her mood and interactions with family  over the holidays. The OP therapist overviewed elements of basic needs and self care in the session.The patient spoke about overall aside from her knee doing well with self care and family interactions over the past week.The OPT therapist will continue treatment work with the patient in her next scheduled session.   Plan: Return again in 2/3 weeks.   Diagnosis:      Axis I: Post Traumatic Stress Disorder  Axis II: No diagnosis     Collaboration of Care: Overview with the patient of involvement in the Med  Management program with Dr. Vickey.   Patient/Guardian was advised Release of Information must be obtained prior to any record release in order to collaborate their care with an outside provider. Patient/Guardian was advised if they have not already done so to contact the registration department to sign all necessary forms in order for us  to release information regarding their care.    Consent: Patient/Guardian gives verbal consent for treatment and assignment of benefits for services provided during this visit. Patient/Guardian expressed understanding and agreed to proceed.      I discussed the assessment and treatment plan with the patient. The patient was provided an opportunity to ask questions and all were answered. The patient agreed with the plan and demonstrated an understanding of the instructions.   The patient was advised to call back or seek an in-person evaluation if the symptoms worsen or if the condition fails to improve as anticipated.   I provided 45 minutes of non-face-to-face time during this encounter.   Jerel Pepper, LCSW    10/29/2024
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
## 2024-11-06 NOTE — Addendum Note (Signed)
 Addended by: FRANCHOT LARVE T on: 11/06/2024 09:57 AM   Modules accepted: Level of Service

## 2024-11-08 ENCOUNTER — Encounter: Payer: Self-pay | Admitting: Psychiatry

## 2024-11-08 ENCOUNTER — Telehealth: Admitting: Psychiatry

## 2024-11-08 ENCOUNTER — Other Ambulatory Visit: Payer: Self-pay

## 2024-11-08 DIAGNOSIS — F4310 Post-traumatic stress disorder, unspecified: Secondary | ICD-10-CM

## 2024-11-08 DIAGNOSIS — F3341 Major depressive disorder, recurrent, in partial remission: Secondary | ICD-10-CM | POA: Diagnosis not present

## 2024-11-08 MED ORDER — DIVALPROEX SODIUM ER 500 MG PO TB24: ORAL_TABLET | ORAL | 0 refills | Status: AC

## 2024-11-08 MED ORDER — DIVALPROEX SODIUM ER 250 MG PO TB24: 250.0000 mg | ORAL_TABLET | Freq: Every day | ORAL | 0 refills | Status: AC

## 2024-11-08 MED ORDER — SERTRALINE HCL 25 MG PO TABS: 25.0000 mg | ORAL_TABLET | Freq: Every day | ORAL | 0 refills | Status: AC

## 2024-11-08 NOTE — Patient Instructions (Signed)
 Visit Information  Ms. Boettger was given information about Medicaid Managed Care team care coordination services as a part of their [facility] Medicaid benefit.   If you would like to schedule transportation through your [facility], please call the following number at least 2 days in advance of your appointment: [phone]   Rides for urgent appointments can also be made after hours by calling Member Services.  Call the Behavioral Health Crisis Line at [phone], at any time, 24 hours a day, 7 days a week. If you are in danger or need immediate medical attention call 911.  Please see education materials related to Diabetes provided by MyChart link.  Care plan and visit instructions communicated with the patient verbally today. Patient agrees to receive a copy in MyChart. Active MyChart status and patient understanding of how to access instructions and care plan via MyChart confirmed with patient.     Telephone follow up appointment with Managed Medicaid care management team member scheduled for:12/04/2024 at 10:00 AM   Hendricks Her RN, BSN  [place] [facility] RN Case Manager   Direct [phone]   Following is a copy of your plan of care:   Goals Addressed             This Visit's Progress    VBCI RN Care Plan   On track    Problems:  Chronic Disease Management support and education needs related to DMII  Goal: Over the next 90 days the Patient will attend all scheduled medical appointments: with providers as evidenced by encounter notes in EPIC        continue to work with RN Care Manager and/or Social Worker to address care management and care coordination needs related to DMII as evidenced by adherence to care management team scheduled appointments     demonstrate ongoing self health care management ability of diabetes as evidenced by recording blood sugar readings and following a diabetic diet    Update  10/08/2024 : Patient will begin to keep log on a regular basis - DAILY FSBS Update 11/08/2024 Waiting for supplies to be delivered from [place].   take all medications exactly as prescribed and will call provider for medication related questions as evidenced by communication with Provider regarding any medication questions or concerns      Interventions:   Diabetes Interventions: Assessed patient's understanding of A1c goal: <6.5% Provided education to patient about basic DM disease process Reviewed medications with patient and discussed importance of medication adherence Discussed plans with patient for ongoing care management follow up and provided patient with direct contact information for care management team Referral made to social work team for assistance with financial stress and returning to work force  Review of patient status, including review of consultants reports, relevant laboratory and other test results, and medications completed Screening for signs and symptoms of depression related to chronic disease state  Assessed social determinant of health barriers Lab Results  Component Value Date   HGBA1C 6.3 07/23/2024    Patient Self-Care Activities:  Attend all scheduled provider appointments Call pharmacy for medication refills 3-7 days in advance of running out of medications Call provider office for new concerns or questions  Notify RN Care Manager of [facility] call rescheduling needs schedule appointment with eye doctor check feet daily for cuts, sores or redness Update 11/08/2024 Patient reports no changes  enter blood sugar readings and medication or insulin  into daily log Update 11/08/2024 Patient needing new meter  and all new supplies. Has not been checking  States that new supplies are pending delivery this week but have been delayed. Patient to call after VBCI visit and check on shipment  Will call RNCM for assistance  if there are any issues take the blood sugar log to all doctor  visits drink 6 to 8 glasses of water each day eat fish at least once per week set a realistic goal UPDATE: 11/08/2024 Patient will keep butt cheek wound covered and call Provider if no improvements in a few days.    Plan:  Telephone follow up appointment with care management team member scheduled for:  12/04/2024 at 10:00 AM

## 2024-11-08 NOTE — Patient Instructions (Addendum)
 Continue Depakote  750 mg in AM, 1000 mg at night  Continue sertraline  25 mg at night  Next appointment: 4/10 at 8 20

## 2024-11-11 ENCOUNTER — Ambulatory Visit (HOSPITAL_COMMUNITY)
Admission: RE | Admit: 2024-11-11 | Discharge: 2024-11-11 | Disposition: A | Source: Ambulatory Visit | Attending: Orthopedic Surgery

## 2024-11-11 ENCOUNTER — Other Ambulatory Visit (HOSPITAL_COMMUNITY)

## 2024-11-11 ENCOUNTER — Encounter (HOSPITAL_COMMUNITY): Payer: Self-pay

## 2024-11-11 DIAGNOSIS — M25562 Pain in left knee: Secondary | ICD-10-CM | POA: Insufficient documentation

## 2024-11-11 DIAGNOSIS — G8929 Other chronic pain: Secondary | ICD-10-CM

## 2024-11-11 DIAGNOSIS — M25561 Pain in right knee: Secondary | ICD-10-CM | POA: Insufficient documentation

## 2024-11-11 MED ORDER — IOHEXOL 180 MG/ML  SOLN
20.0000 mL | Freq: Once | INTRAMUSCULAR | Status: AC
  Administered 2024-11-11: 20 mL via INTRA_ARTICULAR

## 2024-11-11 MED ORDER — LIDOCAINE HCL (PF) 1 % IJ SOLN
5.0000 mL | Freq: Once | INTRAMUSCULAR | Status: AC
  Administered 2024-11-11: 5 mL via INTRADERMAL

## 2024-11-11 MED ORDER — SODIUM CHLORIDE (PF) 0.9% IJ SOLUTION - NO CHARGE
20.0000 mL | INTRAMUSCULAR | Status: DC | PRN
  Administered 2024-11-11: 20 mL via INTRAVENOUS

## 2024-11-11 MED ORDER — IOHEXOL 180 MG/ML  SOLN: 40.0000 mL | Freq: Once | INTRAMUSCULAR | Status: DC | PRN

## 2024-11-12 ENCOUNTER — Other Ambulatory Visit: Payer: Self-pay | Admitting: Cardiology

## 2024-11-20 ENCOUNTER — Ambulatory Visit (HOSPITAL_COMMUNITY): Admitting: Clinical

## 2024-11-26 ENCOUNTER — Encounter: Admitting: Nutrition

## 2024-11-26 ENCOUNTER — Ambulatory Visit: Admitting: "Endocrinology

## 2024-11-27 ENCOUNTER — Ambulatory Visit: Admitting: Cardiology

## 2024-12-02 ENCOUNTER — Ambulatory Visit: Admitting: Gastroenterology

## 2024-12-02 ENCOUNTER — Ambulatory Visit: Admitting: Internal Medicine

## 2024-12-04 ENCOUNTER — Telehealth

## 2024-12-06 ENCOUNTER — Ambulatory Visit: Admitting: Student in an Organized Health Care Education/Training Program

## 2025-01-17 ENCOUNTER — Telehealth: Admitting: Psychiatry

## 2025-01-28 ENCOUNTER — Ambulatory Visit: Admitting: Physician Assistant

## 2025-01-29 ENCOUNTER — Ambulatory Visit: Admitting: Allergy & Immunology
